# Patient Record
Sex: Female | Born: 1950
Health system: Southern US, Community
[De-identification: ages and names within clinical notes are randomized; demographics above are authoritative.]

## PROBLEM LIST (undated history)

## (undated) DIAGNOSIS — R51 Headache: Secondary | ICD-10-CM

## (undated) DIAGNOSIS — K219 Gastro-esophageal reflux disease without esophagitis: Secondary | ICD-10-CM

## (undated) DIAGNOSIS — K5090 Crohn's disease, unspecified, without complications: Secondary | ICD-10-CM

## (undated) DIAGNOSIS — H9319 Tinnitus, unspecified ear: Secondary | ICD-10-CM

## (undated) DIAGNOSIS — L0390 Cellulitis, unspecified: Secondary | ICD-10-CM

## (undated) DIAGNOSIS — E538 Deficiency of other specified B group vitamins: Secondary | ICD-10-CM

## (undated) DIAGNOSIS — R002 Palpitations: Secondary | ICD-10-CM

## (undated) DIAGNOSIS — F419 Anxiety disorder, unspecified: Secondary | ICD-10-CM

## (undated) DIAGNOSIS — Z87442 Personal history of urinary calculi: Secondary | ICD-10-CM

## (undated) DIAGNOSIS — F32A Depression, unspecified: Secondary | ICD-10-CM

## (undated) DIAGNOSIS — E782 Mixed hyperlipidemia: Secondary | ICD-10-CM

## (undated) DIAGNOSIS — M1990 Unspecified osteoarthritis, unspecified site: Secondary | ICD-10-CM

## (undated) DIAGNOSIS — E119 Type 2 diabetes mellitus without complications: Secondary | ICD-10-CM

## (undated) DIAGNOSIS — I639 Cerebral infarction, unspecified: Secondary | ICD-10-CM

## (undated) DIAGNOSIS — N183 Chronic kidney disease, stage 3 unspecified: Secondary | ICD-10-CM

## (undated) DIAGNOSIS — K589 Irritable bowel syndrome without diarrhea: Secondary | ICD-10-CM

## (undated) DIAGNOSIS — Z8709 Personal history of other diseases of the respiratory system: Secondary | ICD-10-CM

## (undated) DIAGNOSIS — G4700 Insomnia, unspecified: Secondary | ICD-10-CM

## (undated) DIAGNOSIS — A029 Salmonella infection, unspecified: Secondary | ICD-10-CM

## (undated) DIAGNOSIS — L0291 Cutaneous abscess, unspecified: Secondary | ICD-10-CM

## (undated) DIAGNOSIS — E1140 Type 2 diabetes mellitus with diabetic neuropathy, unspecified: Secondary | ICD-10-CM

## (undated) DIAGNOSIS — L739 Follicular disorder, unspecified: Secondary | ICD-10-CM

## (undated) DIAGNOSIS — Z9889 Other specified postprocedural states: Secondary | ICD-10-CM

## (undated) DIAGNOSIS — J189 Pneumonia, unspecified organism: Secondary | ICD-10-CM

## (undated) DIAGNOSIS — F329 Major depressive disorder, single episode, unspecified: Secondary | ICD-10-CM

## (undated) DIAGNOSIS — K449 Diaphragmatic hernia without obstruction or gangrene: Secondary | ICD-10-CM

## (undated) DIAGNOSIS — Z9289 Personal history of other medical treatment: Secondary | ICD-10-CM

## (undated) DIAGNOSIS — D649 Anemia, unspecified: Secondary | ICD-10-CM

## (undated) DIAGNOSIS — K2100 Gastro-esophageal reflux disease with esophagitis, without bleeding: Secondary | ICD-10-CM

## (undated) DIAGNOSIS — S72409A Unspecified fracture of lower end of unspecified femur, initial encounter for closed fracture: Secondary | ICD-10-CM

## (undated) DIAGNOSIS — S7291XA Unspecified fracture of right femur, initial encounter for closed fracture: Secondary | ICD-10-CM

## (undated) DIAGNOSIS — N179 Acute kidney failure, unspecified: Secondary | ICD-10-CM

## (undated) HISTORY — DX: Crohn's disease, unspecified, without complications: K50.90

## (undated) HISTORY — DX: Cutaneous abscess, unspecified: L02.91

## (undated) HISTORY — PX: COLONOSCOPY: SHX174

## (undated) HISTORY — DX: Type 2 diabetes mellitus with diabetic neuropathy, unspecified: E11.40

## (undated) HISTORY — DX: Unspecified fracture of lower end of unspecified femur, initial encounter for closed fracture: S72.409A

## (undated) HISTORY — DX: Gastro-esophageal reflux disease with esophagitis: K21.0

## (undated) HISTORY — DX: Insomnia, unspecified: G47.00

## (undated) HISTORY — PX: TOTAL ABDOMINAL HYSTERECTOMY: SHX209

## (undated) HISTORY — DX: Salmonella infection, unspecified: A02.9

## (undated) HISTORY — DX: Palpitations: R00.2

## (undated) HISTORY — PX: TONSILLECTOMY: SUR1361

## (undated) HISTORY — PX: JOINT REPLACEMENT: SHX530

## (undated) HISTORY — DX: Irritable bowel syndrome, unspecified: K58.9

## (undated) HISTORY — PX: CARDIAC CATHETERIZATION: SHX172

## (undated) HISTORY — DX: Acute kidney failure, unspecified: N17.9

## (undated) HISTORY — DX: Depression, unspecified: F32.A

## (undated) HISTORY — DX: Major depressive disorder, single episode, unspecified: F32.9

## (undated) HISTORY — DX: Chronic kidney disease, stage 3 (moderate): N18.3

## (undated) HISTORY — DX: Headache: R51

## (undated) HISTORY — DX: Gastro-esophageal reflux disease with esophagitis, without bleeding: K21.00

## (undated) HISTORY — DX: Unspecified osteoarthritis, unspecified site: M19.90

## (undated) HISTORY — DX: Unspecified fracture of right femur, initial encounter for closed fracture: S72.91XA

## (undated) HISTORY — PX: CHOLECYSTECTOMY: SHX55

## (undated) HISTORY — PX: HERNIA REPAIR: SHX51

## (undated) HISTORY — DX: Cellulitis, unspecified: L03.90

## (undated) HISTORY — DX: Deficiency of other specified B group vitamins: E53.8

## (undated) HISTORY — PX: BACK SURGERY: SHX140

## (undated) HISTORY — DX: Follicular disorder, unspecified: L73.9

## (undated) HISTORY — PX: EYE SURGERY: SHX253

## (undated) HISTORY — PX: FRACTURE SURGERY: SHX138

---

## 2002-05-06 ENCOUNTER — Observation Stay (HOSPITAL_COMMUNITY): Admission: EM | Admit: 2002-05-06 | Discharge: 2002-05-07 | Payer: Self-pay | Admitting: *Deleted

## 2002-09-18 ENCOUNTER — Ambulatory Visit (HOSPITAL_COMMUNITY): Admission: RE | Admit: 2002-09-18 | Discharge: 2002-09-18 | Payer: Self-pay | Admitting: Internal Medicine

## 2004-07-17 ENCOUNTER — Inpatient Hospital Stay (HOSPITAL_BASED_OUTPATIENT_CLINIC_OR_DEPARTMENT_OTHER): Admission: RE | Admit: 2004-07-17 | Discharge: 2004-07-17 | Payer: Self-pay | Admitting: *Deleted

## 2005-03-13 ENCOUNTER — Ambulatory Visit: Payer: Self-pay | Admitting: Internal Medicine

## 2009-02-26 ENCOUNTER — Ambulatory Visit: Payer: Self-pay | Admitting: Cardiology

## 2009-03-21 ENCOUNTER — Ambulatory Visit: Payer: Self-pay | Admitting: Cardiology

## 2009-08-14 DIAGNOSIS — R079 Chest pain, unspecified: Secondary | ICD-10-CM | POA: Insufficient documentation

## 2010-06-21 ENCOUNTER — Encounter: Payer: Self-pay | Admitting: Cardiology

## 2010-09-14 ENCOUNTER — Encounter: Payer: Self-pay | Admitting: Cardiology

## 2010-09-21 ENCOUNTER — Encounter: Payer: Self-pay | Admitting: Cardiology

## 2010-10-05 ENCOUNTER — Encounter: Payer: Self-pay | Admitting: Cardiology

## 2010-10-07 ENCOUNTER — Encounter: Payer: Self-pay | Admitting: Cardiology

## 2010-10-08 ENCOUNTER — Encounter: Payer: Self-pay | Admitting: Cardiology

## 2010-10-09 ENCOUNTER — Encounter: Payer: Self-pay | Admitting: Cardiology

## 2010-11-16 ENCOUNTER — Telehealth (INDEPENDENT_AMBULATORY_CARE_PROVIDER_SITE_OTHER): Payer: Self-pay | Admitting: *Deleted

## 2010-11-18 ENCOUNTER — Encounter: Payer: Self-pay | Admitting: Cardiology

## 2010-11-20 ENCOUNTER — Encounter: Payer: Self-pay | Admitting: Cardiology

## 2010-11-20 ENCOUNTER — Ambulatory Visit
Admission: RE | Admit: 2010-11-20 | Discharge: 2010-11-20 | Payer: Self-pay | Source: Home / Self Care | Attending: Cardiology | Admitting: Cardiology

## 2010-11-20 ENCOUNTER — Ambulatory Visit: Admit: 2010-11-20 | Payer: Self-pay | Admitting: Cardiology

## 2010-11-20 DIAGNOSIS — H8110 Benign paroxysmal vertigo, unspecified ear: Secondary | ICD-10-CM | POA: Insufficient documentation

## 2010-11-20 DIAGNOSIS — R42 Dizziness and giddiness: Secondary | ICD-10-CM | POA: Insufficient documentation

## 2010-11-20 DIAGNOSIS — R9431 Abnormal electrocardiogram [ECG] [EKG]: Secondary | ICD-10-CM | POA: Insufficient documentation

## 2010-11-20 DIAGNOSIS — R609 Edema, unspecified: Secondary | ICD-10-CM | POA: Insufficient documentation

## 2010-11-20 DIAGNOSIS — I2789 Other specified pulmonary heart diseases: Secondary | ICD-10-CM | POA: Insufficient documentation

## 2010-11-20 DIAGNOSIS — I079 Rheumatic tricuspid valve disease, unspecified: Secondary | ICD-10-CM | POA: Insufficient documentation

## 2010-11-20 DIAGNOSIS — R0602 Shortness of breath: Secondary | ICD-10-CM | POA: Insufficient documentation

## 2010-11-20 DIAGNOSIS — Z9189 Other specified personal risk factors, not elsewhere classified: Secondary | ICD-10-CM | POA: Insufficient documentation

## 2010-11-22 ENCOUNTER — Encounter: Payer: Self-pay | Admitting: Cardiology

## 2010-11-30 NOTE — Letter (Signed)
Summary: Internal Other/ PATIENT HISTORY FORM  Internal Other/ PATIENT HISTORY FORM   Imported By: Bartholomew Boards 11/20/2010 15:41:36  _____________________________________________________________________  External Attachment:    Type:   Image     Comment:   External Document

## 2010-11-30 NOTE — Progress Notes (Signed)
Summary: cancelled appt.   ---- Converted from flag ---- ---- 11/15/2010 11:20 AM, Cher Nakai wrote: patient called to cancel appt while call back if she wants to reschedule ------------------------------  Noted Terald Sleeper, MD, [facility]  November 17, 2010 9:50 AM

## 2010-11-30 NOTE — Medication Information (Signed)
Summary: [place] D/C MEDICATION SHEET  [place] D/C MEDICATION SHEET   Imported By: Delfino Lovett 11/20/2010 13:28:48  _____________________________________________________________________  External Attachment:    Type:   Image     Comment:   External Document

## 2010-11-30 NOTE — Letter (Signed)
Summary: [place] D/C DR. [facility]  [place] D/C DR. [facility]   Imported By: Delfino Lovett 11/20/2010 13:38:08  _____________________________________________________________________  External Attachment:    Type:   Image     Comment:   External Document

## 2010-11-30 NOTE — Letter (Signed)
Summary: External Correspondence/ OFFICE NOTE [facility]  External Correspondence/ OFFICE NOTE [facility]   Imported By: Bartholomew Boards 10/19/2010 14:14:00  _____________________________________________________________________  External Attachment:    Type:   Image     Comment:   External Document

## 2010-11-30 NOTE — Medication Information (Signed)
Summary: RX Folder/ [facility] MED LIST  RX Folder/ [facility] MED LIST   Imported By: Bartholomew Boards 10/19/2010 14:17:28  _____________________________________________________________________  External Attachment:    Type:   Image     Comment:   External Document

## 2010-11-30 NOTE — Letter (Signed)
Summary: External Correspondence/ OFFIVE NOTE [facility]  External Correspondence/ OFFIVE NOTE [facility]   Imported By: Bartholomew Boards 10/19/2010 14:09:09  _____________________________________________________________________  External Attachment:    Type:   Image     Comment:   External Document

## 2010-11-30 NOTE — Letter (Signed)
Summary: External Correspondence/ OFFICE NOTE [facility]  External Correspondence/ OFFICE NOTE [facility]   Imported By: Bartholomew Boards 10/19/2010 14:11:11  _____________________________________________________________________  External Attachment:    Type:   Image     Comment:   External Document

## 2010-11-30 NOTE — Assessment & Plan Note (Signed)
Summary: EST - ABN ECHO   Visit Type:  Follow-up Primary Provider:  Manuella Ghazi   History of Present Illness: the patient is a 60 year old female with a prior ischemia workup in 2010 with a Cardiolite study which was negative.  She also had a CT scan for ovary emboli which was within normal limits.  We saw the patient in consultation at that time.  The patient has not been referred by her primary care physician because for what appears to be an abnormal echocardiogram.  She was found to have mild to moderate tricuspid regurgitation and mild pulmonary hypertension.  The patient reports is cardinal symptom that she has been short of breath over the last several weeks.  She also has noticed swelling in the lower extremities particularly in the left leg over the last several months.  He does not appear that she had a chest x-ray done recently.  She did have the horizontal over extremity at [facility] which showed no DVT.  The patient states that she is short of breath on minimal exertion she feels that she get even fix supper.  She also symptoms of orthostasis and becomes very dizzy when she stands up too quickly.  Additionally when she lays down to quickly she then experienced the symptoms of vertigo.  Her blood pressure is elevated today at 164/88.  Of note is that the patient also complains of left-sided chest pain which is constant and is reproducible with palpation of the left chest.  She was also hospitalized recently with arm pain and uncontrolled hypertension.  She denies however any exertional chest pain.  Preventive Screening-Counseling & Management  Alcohol-Tobacco     Smoking Status: never  Current Medications (verified): 1)  Furosemide 40 Mg Tabs (Furosemide) .... Take 1 Tablet By Mouth Once A Day 2)  Nexium 40 Mg Cpdr (Esomeprazole Magnesium) .... Take 1 Tablet By Mouth Once A Day 3)  Estrace 0.5 Mg Tabs (Estradiol) .... Take 1 Tablet By Mouth Once A Day 4)  Diovan 160 Mg Tabs  (Valsartan) .... Take 1 Tablet By Mouth Once A Day 5)  Metoprolol Tartrate 100 Mg Tabs (Metoprolol Tartrate) .... Take 1 Tablet By Mouth Once A Day 6)  Clonidine Hcl 0.1 Mg Tabs (Clonidine Hcl) .... Take 1 Tablet By Mouth Two Times A Day 7)  Celexa 20 Mg Tabs (Citalopram Hydrobromide) .... Take 1 Tablet By Mouth Once A Day 8)  Indomethacin 50 Mg Caps (Indomethacin) .... Take 1 Tablet By Mouth Three Times A Day As Needed 9)  Cyanocobalamin 1000 Mcg/ml Soln (Cyanocobalamin) .... One Injection Monthly 10)  Amaryl 4 Mg Tabs (Glimepiride) .... Take 1 Tablet By Mouth Two Times A Day 11)  Promethazine Hcl 25 Mg Tabs (Promethazine Hcl) .... Take 1 Tablet By Mouth Three Times A Day As Needed 12)  Levemir Flexpen 100 Unit/ml Soln (Insulin Detemir) .... 60 Units Every Morning & 10 Units Every Evening 13)  Zanaflex 4 Mg Tabs (Tizanidine Hcl) .... Take 2 Tablet By Mouth Once A Day At Bedtime 14)  Vitamin D 1000 Unit Tabs (Cholecalciferol) .... Take 1 Tablet By Mouth Once A Day 15)  Zocor 20 Mg Tabs (Simvastatin) .... Take 1 Tab By Mouth At Bedtime  Allergies (verified): 1)  ! * Lisinopril 2)  ! Sulfa 3)  ! Norvasc 4)  ! Cipro 5)  ! Glucophage  Past History:  Past Surgical History: Last updated: 08/14/2009 Abdominal Hysterectomy-Total Cholecystectomy  Family History: Last updated: 08/14/2009 Family History of CVA or Stroke:  Social History: Last updated: 08/14/2009 Full Time Married  Tobacco Use - No.  Alcohol Use - no  Risk Factors: Smoking Status: never (11/20/2010)  Past Medical History: CHEST PAIN-UNSPECIFIED (ICD-786.50) echocardiogram December 2011 primary care office LVH, mild with hypertension with mild to moderate tricuspid regurgitation and mild mitral regurgitation Cardiolite 2002 negative for ischemia CT scan chest 2010 no evidence for pulmonary emboli chronic renal insufficiency creatinine 1.35 and GFR 40 ML/min November 2011 difficult to control hypertension rule out  vertigo    Review of Systems       The patient complains of fatigue, chest pain, shortness of breath, leg swelling, and dizziness.  The patient denies malaise, fever, weight gain/loss, vision loss, decreased hearing, hoarseness, palpitations, prolonged cough, wheezing, sleep apnea, coughing up blood, abdominal pain, blood in stool, nausea, vomiting, diarrhea, heartburn, incontinence, blood in urine, muscle weakness, joint pain, rash, skin lesions, headache, fainting, depression, anxiety, enlarged lymph nodes, easy bruising or bleeding, and environmental allergies.    Vital Signs:  Patient profile:   60 year old female Height:      63 inches Weight:      174.50 pounds BMI:     31.02 Pulse rate:   68 / minute Pulse (ortho):   68 / minute BP sitting:   164 / 88  (left arm) BP standing:   154 / 62 Cuff size:   regular  Vitals Entered By: Lovina Reach, LPN (January 23, [id number] 1:49 PM)  Nutrition Counseling: Patient's BMI is greater than 25 and therefore counseled on weight management options.  Serial Vital Signs/Assessments:  Time      Position  BP       Pulse  Resp  Temp     By 2:45 PM   Lying RA  164/78   60                    Lovina Reach, LPN 579FGE PM   Sitting   162/78   60                    [place], LPN 579FGE PM   Standing  154/62   68                    [place], LPN  Comments: 579FGE PM after 3 min:  144/66  68 after 5 min:  130/68  76 By: Lovina Reach, LPN   Is Patient Diabetic? Yes Comments abnormal echo per [name]   Physical Exam  Additional Exam:  General: Well-developed, well-nourished in no distress head: Normocephalic and atraumatic eyes PERRLA/EOMI intact, conjunctiva and lids normal nose: No deformity or lesions mouth normal dentition, normal posterior pharynx neck: Supple, no JVD.  No masses, thyromegaly or abnormal cervical nodestrauma scar right anterior neck from prior laminectomy lungs: Normal breath sounds bilaterally without wheezing.  Normal  percussion heart: regular rate and rhythm with normal S1 and S2, no S3 or S4.  PMI is normal.  No pathological murmurs abdomen: Normal bowel sounds, abdomen is soft and nontender without masses, organomegaly or hernias noted.  No hepatosplenomegaly musculoskeletal: Back normal, normal gait muscle strength and tone normal pulsus: Pulse is normal in all 4 extremities Extremities: transversal pitting edema somewhat asymmetrical worse in the left leg. neurologic: Alert and oriented x 3 skin: Intact without lesions or rashes cervical nodes: No significant adenopathy psychologic: Normal affect    Impression & Recommendations:  Problem # 1:  CHEST WALL PAIN, HX OF (  ICD-V15.89) the patient's chest wall pain is atypical.  It does not appear to represent angina.  The patient had a stress test in 2010 which was negative for ischemia. however she may need additional workup for ischemia.  Her cardinal symptoms however shortness of breath and this will be worked up first.  Problem # 2:  DYSPNEA (ICD-786.05)  the patient will need a chest x-ray, ABG and a d-dimer level.  She has been ruled out in the past for pulmonary emboli but may need repeat ventilation perfusion scan particularly if she has ongoing renal insufficiency.  We will check the patient's electrolyte panel also. Her updated medication list for this problem includes:    Furosemide 40 Mg Tabs (Furosemide) .Marland Kitchen... Take 1 tablet by mouth once a day    Diovan 160 Mg Tabs (Valsartan) .Marland Kitchen... Take 1 tablet by mouth once a day    Metoprolol Tartrate 100 Mg Tabs (Metoprolol tartrate) .Marland Kitchen... Take 1 tablet by mouth once a day  Orders: T-Chest x-ray, 2 views ([id number]) T-Basic Metabolic Panel ([id number]) T-CBC No Diff [id number]) T-D-Dimer Fibrin Derivatives Quantitive [id number]) ABG Room Air (ABG Room Air)  Problem # 3:  LEG EDEMA, BILATERAL (ICD-782.3) the patient is bottle leg edema somewhat worse on the left side however she is already on  significant doses Lasix.  The patient may require right heart catheterization to make sure that her PA pressures normal under estimated by the echocardiogram.  She could have significant pulmonary hypertension.  Problem # 4:  ORTHOSTATIC DIZZINESS (ICD-780.4) orthostatic blood pressures will be obtaining the office today.  This will be difficult to treat given her underlying hypertension I recommend compression stockings in the meanwhile  Problem # 5:  TRICUSPID REGURGITATION, MODERATE (ICD-397.0) etiology of tricuspid regurgitation is not clear but could be from pulmonary hypertension.  Again the patient may need heart catheterization. Her updated medication list for this problem includes:    Furosemide 40 Mg Tabs (Furosemide) .Marland Kitchen... Take 1 tablet by mouth once a day    Diovan 160 Mg Tabs (Valsartan) .Marland Kitchen... Take 1 tablet by mouth once a day    Metoprolol Tartrate 100 Mg Tabs (Metoprolol tartrate) .Marland Kitchen... Take 1 tablet by mouth once a day  Problem # 6:  NONSPECIFIC ABNORMAL ELECTROCARDIOGRAM (ICD-794.31) the patient appears to have QTC prolongation of which could be from a combination of promethazine and citalopram.  The patient however reports no palpitations or rhythm abnormalities. Her updated medication list for this problem includes:    Metoprolol Tartrate 100 Mg Tabs (Metoprolol tartrate) .Marland Kitchen... Take 1 tablet by mouth once a day  Other Orders: EKG w/ Interpretation ([id number])  Patient Instructions: 1)  Follow up with Dr. Lutricia Feil on Wed, December 06, 2010 at 3pm. 2)  A chest x-ray takes a picture of the organs and structures inside the chest, including the heart, lungs, and blood vessels. This test can show several things, including, whether the heart is enlarged; whether fluid is building up in the lungs; and whether pacemaker / defibrillator leads are still in place. 3)  Your physician recommends that you have labwork and an ABG done at [facility]. Please go to Outpatient Registration on Wed,  November 22, 2010 at 1030am to have this done.  4)  Further testing will be determined by lab results.

## 2010-12-06 ENCOUNTER — Ambulatory Visit: Payer: Self-pay | Admitting: Cardiology

## 2010-12-12 ENCOUNTER — Inpatient Hospital Stay (HOSPITAL_COMMUNITY)
Admission: AD | Admit: 2010-12-12 | Discharge: 2010-12-14 | DRG: 287 | Disposition: A | Discharge status: Home / Self Care | Payer: Medicare Other | Source: Other Acute Inpatient Hospital | Attending: Cardiology | Admitting: Cardiology

## 2010-12-12 DIAGNOSIS — D649 Anemia, unspecified: Secondary | ICD-10-CM | POA: Diagnosis present

## 2010-12-12 DIAGNOSIS — K219 Gastro-esophageal reflux disease without esophagitis: Secondary | ICD-10-CM | POA: Diagnosis present

## 2010-12-12 DIAGNOSIS — E785 Hyperlipidemia, unspecified: Secondary | ICD-10-CM | POA: Diagnosis present

## 2010-12-12 DIAGNOSIS — R0602 Shortness of breath: Secondary | ICD-10-CM

## 2010-12-12 DIAGNOSIS — N189 Chronic kidney disease, unspecified: Secondary | ICD-10-CM | POA: Diagnosis present

## 2010-12-12 DIAGNOSIS — I129 Hypertensive chronic kidney disease with stage 1 through stage 4 chronic kidney disease, or unspecified chronic kidney disease: Secondary | ICD-10-CM | POA: Diagnosis present

## 2010-12-12 DIAGNOSIS — K449 Diaphragmatic hernia without obstruction or gangrene: Secondary | ICD-10-CM | POA: Diagnosis present

## 2010-12-12 DIAGNOSIS — R0789 Other chest pain: Principal | ICD-10-CM | POA: Diagnosis present

## 2010-12-12 DIAGNOSIS — E119 Type 2 diabetes mellitus without complications: Secondary | ICD-10-CM | POA: Diagnosis present

## 2010-12-12 DIAGNOSIS — R072 Precordial pain: Secondary | ICD-10-CM

## 2010-12-12 LAB — GLUCOSE, CAPILLARY: Glucose-Capillary: 212 mg/dL — ABNORMAL HIGH (ref 70–99)

## 2010-12-13 ENCOUNTER — Inpatient Hospital Stay (HOSPITAL_COMMUNITY): Payer: Medicare Other

## 2010-12-13 DIAGNOSIS — R079 Chest pain, unspecified: Secondary | ICD-10-CM

## 2010-12-13 LAB — CBC
HCT: 29.5 % — ABNORMAL LOW (ref 36.0–46.0)
Hemoglobin: 9.5 g/dL — ABNORMAL LOW (ref 12.0–15.0)
MCH: 28.2 pg (ref 26.0–34.0)
MCHC: 32.2 g/dL (ref 30.0–36.0)
MCV: 87.5 fL (ref 78.0–100.0)
RBC: 3.37 MIL/uL — ABNORMAL LOW (ref 3.87–5.11)

## 2010-12-13 LAB — POCT I-STAT, CHEM 8
BUN: 16 mg/dL (ref 6–23)
Calcium, Ion: 1.18 mmol/L (ref 1.12–1.32)
Chloride: 105 mEq/L (ref 96–112)
Creatinine, Ser: 1.4 mg/dL — ABNORMAL HIGH (ref 0.4–1.2)
TCO2: 22 mmol/L (ref 0–100)

## 2010-12-13 LAB — POCT I-STAT 3, VENOUS BLOOD GAS (G3P V)
Acid-base deficit: 2 mmol/L (ref 0.0–2.0)
Acid-base deficit: 6 mmol/L — ABNORMAL HIGH (ref 0.0–2.0)
Bicarbonate: 24 mEq/L (ref 20.0–24.0)
Bicarbonate: 24.6 mEq/L — ABNORMAL HIGH (ref 20.0–24.0)
O2 Saturation: 51 %
O2 Saturation: 44 %
TCO2: 26 mmol/L (ref 0–100)
pCO2, Ven: 51.6 mmHg — ABNORMAL HIGH (ref 45.0–50.0)
pCO2, Ven: 51.3 mmHg — ABNORMAL HIGH (ref 45.0–50.0)
pCO2, Ven: 51.6 mmHg — ABNORMAL HIGH (ref 45.0–50.0)
pH, Ven: 7.277 (ref 7.250–7.300)
pH, Ven: 7.267 (ref 7.250–7.300)
pH, Ven: 7.285 (ref 7.250–7.300)
pO2, Ven: 32 mmHg (ref 30.0–45.0)

## 2010-12-13 LAB — D-DIMER, QUANTITATIVE: D-Dimer, Quant: <0.22 ug/mL-FEU (ref 0.00–0.48)

## 2010-12-13 LAB — POCT I-STAT 3, ART BLOOD GAS (G3+)
Acid-base deficit: 3 mmol/L — ABNORMAL HIGH (ref 0.0–2.0)
Bicarbonate: 22.6 mEq/L (ref 20.0–24.0)
O2 Saturation: 92 %
pO2, Arterial: 70 mmHg — ABNORMAL LOW (ref 80.0–100.0)

## 2010-12-13 LAB — BASIC METABOLIC PANEL
CO2: 27 mEq/L (ref 19–32)
Calcium: 8.5 mg/dL (ref 8.4–10.5)
Chloride: 104 mEq/L (ref 96–112)
Creatinine, Ser: 1.42 mg/dL — ABNORMAL HIGH (ref 0.4–1.2)
Glucose, Bld: 123 mg/dL — ABNORMAL HIGH (ref 70–99)

## 2010-12-13 LAB — GLUCOSE, CAPILLARY
Glucose-Capillary: 108 mg/dL — ABNORMAL HIGH (ref 70–99)
Glucose-Capillary: 98 mg/dL (ref 70–99)

## 2010-12-14 DIAGNOSIS — R079 Chest pain, unspecified: Secondary | ICD-10-CM

## 2010-12-14 LAB — GLUCOSE, CAPILLARY: Glucose-Capillary: 165 mg/dL — ABNORMAL HIGH (ref 70–99)

## 2010-12-14 LAB — CBC
HCT: 27.5 % — ABNORMAL LOW (ref 36.0–46.0)
Hemoglobin: 8.9 g/dL — ABNORMAL LOW (ref 12.0–15.0)
MCHC: 32.4 g/dL (ref 30.0–36.0)
RBC: 3.12 MIL/uL — ABNORMAL LOW (ref 3.87–5.11)
WBC: 6.4 10*3/uL (ref 4.0–10.5)

## 2010-12-14 LAB — BASIC METABOLIC PANEL
CO2: 24 mEq/L (ref 19–32)
Calcium: 8.1 mg/dL — ABNORMAL LOW (ref 8.4–10.5)
GFR calc Af Amer: 45 mL/min — ABNORMAL LOW (ref >60)
Potassium: 4 mEq/L (ref 3.5–5.1)
Sodium: 138 mEq/L (ref 135–145)

## 2010-12-14 NOTE — H&P (Signed)
Heather [name], Heather [name]           ACCOUNT NO.:  [id number]  MEDICAL RECORD NO.:  [id number]           PATIENT TYPE:  I  LOCATION:  2919                         FACILITY:  [place]  PHYSICIAN:  Fransico Him, M.D.     DATE OF BIRTH:  1950-12-03  DATE OF ADMISSION:  12/12/2010 DATE OF DISCHARGE:                             HISTORY & PHYSICAL   REFERRING PHYSICIAN:  [facility].  PRIMARY CARDIOLOGIST:  Loralie Champagne, MD  CHIEF COMPLAINT:  Chest pain.  HISTORY OF PRESENT ILLNESS:  This is a 60 year old white female with a history of hypertension, diabetes mellitus, dyslipidemia, and normal coronary arteries by cath in 2005 who presented to [facility] with complaints of substernal chest pain.  She has had chronic chest pain for several years with a negative CT for PE and nuclear stress test that was normal in 2010.  Echo at that time showed mild pulmonary hypertension.  She has continued to have chronic intermittent chest pain which over the past couple of weeks has become severe and mainly almost constant in nature.  It is left sided, reproducible with palpation on her axilla, and is associated with shortness of breath.  She is now transferred from [facility] for right and left heart catheterization for further evaluation.  PAST MEDICAL HISTORY:  Hypertension, dyslipidemia, type 2 diabetes mellitus, normal coronary arteries by cath in 2005, atypical chest pain, GERD with hiatal hernia, kidney stone at age 70, and chemical-induced laryngitis in 2001.  ALLERGIES: 1. SULFA. 2. METFORMIN. 3. CIPRO  PAST SURGICAL HISTORY:  Status post left inguinal hernia repair, benign breast biopsy status post cholecystectomy in 2002, partial hysterectomy for cervical for uterine CA, removal of urethral cyst, and status post cystectomy.  SOCIAL HISTORY:  She is married.  She denies any tobacco or alcohol use.  FAMILY HISTORY:  Her mother had female cancer, diabetes, and CVA.   Her father died of MI at 83.  MEDICATIONS: 1. Estrace 0.5 mg daily. 2. Nexium 40 mg daily. 3. Diovan 160 mg daily. 4. Tizanidine 4 mg 2 tablets nightly. 5. Vitamin D 1000 international units daily. 6. Amaryl 4 mg 2 tablets daily. 7. Furosemide 40 mg daily. 8. Zocor 20 mg daily. 9. Celexa 20 mg daily. 10.Lopressor 100 mg daily. 11.__________ mg daily. 12.Clonidine 0.1 mg daily. 13.Levemir insulin 60 units in the morning and 10 units in the     evening.  REVIEW OF SYSTEMS:  Otherwise as stated in the HPI is negative.  PHYSICAL EXAMINATION:  VITAL SIGNS:  Blood pressure is 128/60, heart rate 70, and O2 saturations 100% on room air. GENERAL:  This is a well-developed, well-nourished white female in no acute distress. HEENT:  Benign. NECK:  Supple without lymphadenopathy.  Carotid upstrokes are +2 bilaterally.  No bruits. LUNGS:  Clear to auscultation throughout. HEART:  Regular rate and rhythm.  No murmurs, rubs, or gallops.  Normal S1 and S2. ABDOMEN:  Soft, nontender, and nondistended.  Normoactive bowel sounds. No hepatosplenomegaly. EXTREMITIES:  No cyanosis, erythema, or edema.  LABORATORY DATA:  Sodium 137, potassium 3.5, chloride 104, bicarb 24, BUN 19, creatinine 1.19, and glucose 272.  White cell count 6.1, hemoglobin 10.1, hematocrit 30.2, and platelet count 200.  D-dimer 0.23. Hemoglobin A1C 8.2.  BNP 52.  CPKs are 86, 95, and 115.  MB 1.5, 1.8, and 2.1.  Troponin 0.01 x3.  EKG shows normal sinus rhythm with nonspecific T-wave abnormality.  ASSESSMENT: 1. Atypical chest pain with cardiac risk factors including     hypertension, dyslipidemia, and diabetes mellitus with negative     cardiac catheterization in 2005.  Her pain is pretty much constant     now and nothing helps the pain and despite negative cardiac     enzymes. 2. Diabetes mellitus. 3. Hypertension. 4. Gastroesophageal reflux disease. 5. Dyslipidemia.  PLAN:  Admit to Telemetry Step-down Unit.   We will continue IV nitroglycerin drip.  No anticoagulation given the patient's rule out for MI is complete and her symptoms are atypical.  We will make her n.p.o. after midnight.  We will plan right and left heart cath in the morning per Dr. Aundra Dubin.     Fransico Him, M.D.    TT/MEDQ  D:  12/12/2010  T:  12/13/2010  Job:  [id number]  cc:   Loralie Champagne, MD  Electronically Signed by Fransico Him M.D. on 12/14/2010 04:14:35 PM

## 2010-12-18 ENCOUNTER — Encounter: Payer: Self-pay | Admitting: Cardiology

## 2010-12-20 NOTE — Procedures (Signed)
NAMEMARTAVIA, [name]           ACCOUNT NO.:  [id number]  MEDICAL RECORD NO.:  [id number]           PATIENT TYPE:  I  LOCATION:  2919                         FACILITY:  [place]  PHYSICIAN:  Loretha Brasil. Lia Foyer, MD, FACCDATE OF BIRTH:  03/21/1951  DATE OF PROCEDURE:  12/13/2010 DATE OF DISCHARGE:                           CARDIAC CATHETERIZATION   INDICATIONS:  Ms. Garstka is 60 years old.  She has been complaining of some chest pain and shortness of breath.  She was seen by Dr. Dannielle Burn and subsequently referred down for cardiac catheterization.  Risks, benefits, and alternatives were discussed with the patient.  We elected not to perform left ventriculography because of her reduced creatinine clearance.  PROCEDURE: 1. Right and left heart catheterization. 2. Selective coronary territory.  DESCRIPTION OF PROCEDURE:  The patient was brought to the catheterization laboratory and prepped and draped in the usual fashion after informed consent.  Using a Smart needle, the right femoral vein was entered, 7-French sheath was placed.  Thermodilution Swan-Ganz catheter was placed in the superior vena cava where saturation was obtained.  Serial right heart pressures were then measured and pulmonary artery saturation was obtained.  Because of the low saturations in the pulmonary artery as well as the superior vena cava, we elected to repeat these values on another machine, they were similar.  Thermodilution cardiac outputs were performed.  Following this, a Smart needle was used to enter the right femoral artery, 5-French sheath was placed.  A pigtail catheter was placed in the central aorta and also in the left ventricle.  Saturation was obtained.  Following this, simultaneous wedge LV was performed.  Following this, a right heart pullback was performed and simultaneous RV, LV pressures obtained.  There was clearly a differential between the LV and RV pressure and as well as the LV and  RA pressure.  The thermodilution catheter was then placed up in the superior vena cava where an additional saturation was obtained.  A pigtail catheter was pulled back for pullback pressure.  As mentioned no ventriculography was performed.  These catheters were all then removed. Coronary arteriography was then performed using a standard Judkins catheter for the left and a Williams catheter for the right.  There were no complications.  I called the patient's husband by phone.  The sheaths were removed in the laboratory for hemostasis.  HEMODYNAMIC DATA: 1. Right atrial pressure 9-11. 2. RV 32/9-12. 3. Pulmonary artery 32/18, mean 24. 4. Pulmonary capillary wedge 17. 5. Aortic 111/60, mean 81. 6. LV 102/17. 7. No gradient pullback across aortic valve. 8. No mitral valve gradient and simultaneous pressure measurements. 9. Superior vena cava saturation 52%. 10.Pulmonary artery saturation 52%. 11.Aortic saturation 92%. 12.Fick cardiac output 4.7 liters per minute. 13.Fick cardiac index 2.6 liters per minute per meter squared. 14.Thermodilution cardiac output 3.9 liters per minute. 15.Thermodilution cardiac index 2.1 liters per minute per meter     squared.  ANGIOGRAPHIC DATA: 1. The left main coronary artery is free of critical disease. 2. The LAD courses towards the apex.  There are twin vessels down over     the anteroseptal, anterolateral segment.  One is the  diagonal, the     other is likely the LAD with multiple septal perforators.  This     vessel was smooth throughout without significant focal narrowing. 3. The circumflex provides a large marginal branch and a smaller AV     circumflex, both of which appear free of critical disease. 4. The right coronary artery is moderately large vessel providing     posterior descending and posterolateral branch.  There may be some     minor plaquing at the bifurcation of the PLA, PDA, but no     significant areas of high-grade focal  stenosis noted.  CONCLUSION: 1. Borderline elevation of right heart pressures without significant     pulmonary hypertension. 2. No evidence of either aortic or mitral valve gradient. 3. Somewhat low saturations in the superior vena cava and pulmonary     artery rechecked during the procedure. 4. No significant coronary obstruction.  DISPOSITION:  I have discussed her case with Dr. Aundra Dubin.  We will get a 2-D echo to reassess her situation whether she needs further imaging studies is unclear.  At the present time, she will not need revascularization.  Her right heart pressures are minimally elevated. Her saturations are somewhat low, for unclear reasons.  I will be gone for the next several days.  She will be seen by the [facility] team and subsequently referred back to Dr. Dannielle Burn in [place].     Loretha Brasil. Lia Foyer, MD, [facility]     TDS/MEDQ  D:  12/13/2010  T:  12/14/2010  Job:  [id number]  cc:   Ernestine Mcmurray, MD,FACC CV Laboratory  Electronically Signed by Bing Quarry MD [facility] on 12/20/2010 09:11:37 PM

## 2010-12-20 NOTE — Discharge Summary (Addendum)
NAMEELLEY, Heather [name] NO.:  [id number]  MEDICAL RECORD NO.:  [id number]           PATIENT TYPE:  I  LOCATION:  2919                         FACILITY:  [facility]  PHYSICIAN:  Carlena Bjornstad, MD, FACCDATE OF BIRTH:  [birth year]  DATE OF ADMISSION:  12/12/2010 DATE OF DISCHARGE:  12/14/2010                              DISCHARGE SUMMARY   DISCHARGE DIAGNOSES: 1. Chest pain, felt noncardiac.     a.     Cardiac catheterization on December 13, 2010, demonstrating      no significant coronary artery disease.     b.     Negative D-dimer. 2. Hypertension. 3. Diabetes mellitus. 4. Dyslipidemia. 5. History of chronic chest pain with negative CT for pulmonary     embolus and nuclear stress test that was normal in 2010. 6. Anemia with discharge hemoglobin of 8.9, for close outpatient     followup next week, per PCP. 7. History of gastroesophageal reflux disease with hiatal hernia. 8. Kidney stone at age 42. 28. Chemical-induced pharyngitis in 2001. 10.Status post left inguinal hernia. 11.Status post breast biopsy that was benign. 12.Status post cholecystectomy in 2002. 13.Status post partial hysterectomy for cervical cancer. 14.Status post removal of urethral cyst. 15.Status post diskectomy. 16.Type 2 diabetes mellitus. 17.Chronic renal insufficiency with discharge creatinine of 1.46.  HOSPITAL COURSE:  [name] is a 60 year old female with a prior history of normal coronaries by cath in 2005, chronic chest pain, hypertension, dyslipidemia, and diabetes, who presented to [facility] with complaints of substernal chest pain with intermittent symptoms over the past that has become severe and was not constant in nature and reproducible with palpation and it was associated with shortness of breath.  She was transferred to [facility] for further evaluation.  IV nitroglycerin drip was started.  Anticoagulation was held given the atypical nature of her  symptoms.  Cardiac catheterization was arranged for the following day on December 13, 2010, which demonstrated no significant coronary artery disease.  She did, however, have borderline elevation of her pressures without significant pulmonary hypertension and somewhat low saturations in the superior vena cava and pulmonary artery which were rechecked during the procedure. Dr. Lia Foyer performed this procedure.  The patient was seen today by Dr. Ron Parker and had other complaints with toe pain and nausea.  D-dimer was done 2 ensure no pulmonary embolism which was within normal limits and negative.  Chest x-ray showed no acute disease.  The etiology of her saturations were unclear, but Dr. Ron Parker did not feel that this was limiting her from going home.  Her hemoglobin today is 8.9, but there are no overt signs of bleeding.  Her hemoglobin on admission was 9.5.  I have discussed the findings with Dr. Ron Parker who has seen and examined her today and feels she is stable for discharge.  Of note, she also had an echocardiogram with the below findings with normal wall motion and normal EF and a mildly elevated PA pressure of 35 mmHg.  DISCHARGE LABORATORY DATA:  WBC 6.4, hemoglobin 8.9, hematocrit 27.5, platelet count 174,000.  D-dimer is 0.2.  Sodium 138, potassium 4.0, chloride 107, CO2  24, glucose 223, BUN 15, creatinine 1.46.  MRSA screen was positive.  STUDIES: 1. Chest x-ray on December 13, 2010 showed no acute findings. 2. Cardiac catheterization on December 13, 2010, please see full     report for details as well as HPI for summary. 3. A 2-D echocardiogram on December 14, 2010, demonstrated moderate     LVH.  EF was 60%.  Wall motion was normal with no wall motion     abnormalities.  PA pressure was 35 mmHg.  DISCHARGE MEDICATIONS: 1. Amaryl 4 mg 2 tablets daily. 2. Celexa 20 mg daily. 3. Clonidine 0.1 mg daily. 4. Diovan 160 mg daily. 5. Estrace 0.5 mg daily. 6. Furosemide 40 mg daily. 7.  Indomethacin 15 mg daily. 8. Levemir, the patient takes 10 units if pressure over 200 and 6     units every morning. 9. Lopressor 100 mg daily. 10.Nexium 40 mg daily. 11.Tizanidine 4 mg 2 tablets daily at bedtime. 12.Visine 1 drop each right daily as needed for allergies. 13.Vitamin B12 1000 mcg/mL one injection subcutaneously monthly. 14.Vitamin D tablet daily. 15.Simvastatin 20 mg 1 tablet daily.  DISPOSITION:  [name] will be discharged in stable condition to home.  She is instructed to increase activity slowly, not to lift anything for 1 week, participate in sexual activity for 1 week, or drive for 2 days.  She is to follow a heart-healthy diabetic diet.  If she notices pain, swelling, or pus at her cath site, she is to call or return.  She was instructed to follow up with her primary care provider as above for evaluation of her anemia.  I have scheduled an appointment for her to see [name] on December 20, 2010, at 2 p.m.  She will also follow up with Dr. Dannielle Burn in our office, we will call her for this appointment.  She is also instructed to return to the ER if she has any activity that causes chest pain, shortness of dizziness, sweating, weakness, or passing out along with bleeding.  The patient has been trying to arrange a ride home for this evening and we have asked Social Work to assist in this as well.  DURATION OF DISCHARGE ENCOUNTER:  Greater than 30 minutes including physician and PA time.     Melina Copa, P.A.C.   ______________________________ Carlena Bjornstad, MD, [facility]    DD/MEDQ  D:  12/14/2010  T:  12/15/2010  Job:  [id number]  cc:   Ernestine Mcmurray, MD,FACC [name], MD  Electronically Signed by Melina Copa  on 12/20/2010 09:14:54 AM Electronically Signed by Dola Argyle MD [place] on 12/21/2010 09:10:09 AM

## 2010-12-26 ENCOUNTER — Encounter (INDEPENDENT_AMBULATORY_CARE_PROVIDER_SITE_OTHER): Payer: Self-pay | Admitting: *Deleted

## 2011-01-04 NOTE — Letter (Signed)
Summary: Risk analyst at [facility]. [address]   [place], [place] [address]   Phone: [phone]  Fax: [phone]        December 26, 2010 MRN: [id number]   BLAYRE BUCHKO [address] [place], [place]  [address]   Dear Ms. Helton,  Your test ordered by Rande Lawman has been reviewed by your physician (or physician assistant) and was found to be normal or stable. Your physician (or physician assistant) felt no changes were needed at this time.  ____ Echocardiogram  ____ Cardiac Stress Test  ___x_ Lab Work    PLEASE REFER TO YOUR PRIMARY PHYSICIAN FOR ANEMIA   ____ Peripheral vascular study of arms, legs or neck  ____ CT scan or X-ray  ____ Lung or Breathing test  ____ Other:   Thank you.   Tammi Romine CMA    Bryon Lions, M.D., F.A.C.C. Maceo Pro, M.D., F.A.C.C. Cammy Copa, M.D., F.A.C.C. Vonda Antigua, M.D., F.A.C.C. Vita Barley, M.D., F.A.C.C. Mare Ferrari, M.D., F.A.C.C. Emi Belfast, PA-C

## 2011-01-10 ENCOUNTER — Ambulatory Visit: Payer: Self-pay | Admitting: Cardiology

## 2011-03-13 NOTE — Assessment & Plan Note (Signed)
[facility] OFFICE NOTE   Heather [name], Heather [name]                  MRN:          [id number]  DATE:03/21/2009                            DOB:          01-26-1951    REFERRING PHYSICIAN:  [name], MD   HISTORY OF PRESENT ILLNESS:  The patient is a very pleasant 60 year old  female who was recently admitted with atypical chest pain likely  secondary to pleurisy.  She does have risk factors for coronary artery  disease including hypertension, dyslipidemia, and diabetes mellitus.  The patient was ruled out for pulmonary embolism by CT scan.  She did an  outpatient Cardiolite study which was also negative for ischemia.  Her  EKG today shows no acute or chronic changes.  The patient states she is  doing well.  She has on discharge received antibiotic treatment and  nonsteroidals.  Her pain has completely resolved, and she states that  she has resumed all normal daily activities.   MEDICATIONS:  1. Estrace 0.5 mg p.o. daily.  2. Lopressor 100 mg p.o. b.i.d.  3. Metformin 500 mg p.o. b.i.d.  4. Diovan/hydrochlorothiazide 320/25 daily.  5. Nexium 40 mg p.o. daily.  6. Celexa 20 mg p.o. daily.  7. Zocor 20 mg p.o. daily.  8. Zanaflex 4 mg p.o. at bedtime.  9. Levemir 30 units q.a.m.  10.NovoLog sliding scale p.r.n.  11.Vitamin B12.  12.Vitamin D.  13.Iron pill.   PHYSICAL EXAMINATION:  VITAL SIGNS:  Blood pressure 179/80, heart rate  63, weight 170 pounds.  GENERAL:  Well-nourished white female in no apparent distress.  HEENT:  Pupils isochoric.  Conjunctivae clear.  NECK:  Supple.  Normal carotid upstroke.  No carotid bruits.  LUNGS:  Clear.  HEART:  Regular rate and rhythm.  Normal S1 and S2.  ABDOMEN:  Soft.  EXTREMITIES:  No cyanosis, clubbing, or edema.   PROBLEMS:  1. Status post atypical chest pain, likely secondary to pleurisy.  2. Ruled out for pulmonary embolism.  3. No evidence of  significant ischemic heart disease despite multiple      risk factors.  4. Rule out hypertension.   PLAN:  1. At this point, the patient has no need for further cardiac workup.      She is asymptomatic.  2. The patient's blood pressure is elevated today although it appears      to be controlled during the hospitalization.  We will leave further      management and followup to [name].  3. From our standpoint, the patient can be seen on a p.r.n. basis.     Ernestine Mcmurray, MD,FACC  Electronically Signed    GED/MedQ  DD: 03/21/2009  DT: 03/22/2009  Job #: [id number]   cc:   [name], MD

## 2011-03-16 NOTE — Cardiovascular Report (Signed)
NAME:  Heather [name], Heather [name]                     ACCOUNT NO.:  [id number]   MEDICAL RECORD NO.:  [id number]                   PATIENT TYPE:  OIB   LOCATION:  6501                                 FACILITY:  [facility]   PHYSICIAN:  Junious Silk, M.D. [facility]         DATE OF BIRTH:  09/07/1951   DATE OF PROCEDURE:  07/17/2004  DATE OF DISCHARGE:                              CARDIAC CATHETERIZATION   PROCEDURE PERFORMED:  Left heart catheterization with coronary angiography  and left ventriculography.   INDICATION:  [name] is a 60 year old woman with multiple cardiac risk  factors including diabetes, hypertension and dyslipidemia.  She has had  recurrent episodes of substernal chest pain.  She has had normal Cardiolite  studies in the past as well as catheterization two years ago which showed no  significant coronary artery disease.  However, because of her recurrent  symptoms she was referred again fro cardiac catheterization to rule out the  development of significant coronary artery disease.   CATHETERIZATION PROCEDURAL NOTE:  A 4 French sheath was placed in the right  femoral artery.  Coronary angiography was performed with standard Judkins 4  French catheters.  Left ventriculography was performed with an angled  pigtail catheter.  Contrast was Omnipaque.  There were no complications.   CATHETERIZATION RESULTS:   HEMODYNAMICS:  1.  Left ventricular pressure 170/16.  2.  Aortic pressure 160/78.  3.  There is no aortic valve gradient.   LEFT VENTRICULOGRAM:  Wall motion is normal.  Ejection fraction is estimated  at greater than or equal to 65%.  There is no mitral regurgitation.   CORONARY ARTERIOGRAPHY (RIGHT DOMINANT):  Left main is normal.   Left anterior descending artery has minor luminal irregularities in the  distal vessel.  The LAD gives rise to a small first diagonal and large  second diagonal branch.   Left circumflex gives rise to a very large first obtuse  marginal and small  second obtuse marginal branch.  The left circumflex is normal.   Right coronary artery has a 20% stenosis proximally which may in part be due  to catheter related spasm.  The right coronary artery is otherwise normal  giving rise to a large posterior descending artery and a large posterior  lateral branch.   IMPRESSION:  1.  Normal left ventricular systolic function.  2.  No significant epicardial coronary artery disease identified.   RECOMMENDATIONS:  Medical therapy.  Would recommend consideration of a trial  of Imipramine for possible microvascular disease.      MWP/MEDQ  D:  07/17/2004  T:  07/17/2004  Job:  [id number]   cc:   [name], MD  [place]  [place] [address]  Fax: [facility] in [place]

## 2011-03-16 NOTE — H&P (Signed)
NAME:  Heather [name], Heather [name]                     ACCOUNT NO.:  [id number]   MEDICAL RECORD NO.:  [id number]                   PATIENT TYPE:   LOCATION:                                       FACILITY:  [facility]   PHYSICIAN:  Hildred Laser, M.D.                 DATE OF BIRTH:  16-Jan-1951   DATE OF ADMISSION:  DATE OF DISCHARGE:                                HISTORY & PHYSICAL   CHIEF COMPLAINT:  Chronic diarrhea, weight loss and abdominal pain.   HISTORY OF PRESENT ILLNESS:  The patient is a pleasant 60 year old lady who  presents today for further evaluation of the above-stated symptoms at the  request of [name].  She has been seen at our practice previously for  similar symptoms.  She states over the last two months she has developed  persistent diarrhea.  She is having upwards to 10-15 loose watery stools  daily.  Her stools are fairly yellow liquidity.  She denies any melena or  bright red blood per rectum.  She never has a solid stool.  She denies any  antibiotic therapy prior to the beginning of these symptoms.  She has not  had any new medication changes around the development of these symptoms as  well.  Yesterday she began having some nausea and vomiting.  She has had a  16-pound weight loss over the last couple of months.  She was hospitalized  recently due to hypokalemia related to her diarrhea.  Overall, her blood  glucose levels have been running reasonably well, less than 200.  She has  been tried on multiple medications for her diarrhea including Questran,  Bentyl, Levbid, Lomotil, and Imodium.  She denies any heartburn symptoms.   During recent hospitalization she had stool studies, which were negative for  C-diff, stool culture and O&P.  She denies any treatment of her diarrhea  with antibiotics.  Prior to the development of this diarrhea she was having  one to two bowel movements daily.  She states over the weekend she had a  temp of 101.2.   CURRENT MEDICATIONS:  1. Metoprolol 50 mg half tablet b.i.d.  2. Valium p.r.n.  3. Lasix 20 mg b.i.d. p.r.n.  4. Actos 30 mg q.d.  5. Levbid 0.375 mg p.r.n.  6. Glucophage 500 mg q.d.  7. Diovan 80 mg q.d.  8. Prilosec 40 mg q.d.  9. Anaprox DS 550 mg b.i.d.  10.      Ambien 2 mg p.r.n.  11.      K-Dur 20 mEq 2 daily.   ALLERGIES:  SULFA DRUGS.   PAST MEDICAL HISTORY:  1. Noninsulin-dependent diabetes mellitus.  2. Hypertension.  3. Gastroesophageal reflux disease.  4. Remote history of kidney stone in her 59s.  5. She has had atypical chest pain in February 2002, which was evaluated by     a stress echo and read as negative by Dr. Ron Parker.  6. She either had cervical or uterine cancer in her 60s  7. History of chemically-induced laryngitis diagnosed by Dr. Margart Sickles in 2001     at the time of laryngoscopy.   The patient has previously been seen during two hospitalizations; most  recently in November 2002 was for abdominal pain.  She was found to have  truly found to have left groin pain, which was discerned to be due to left  inguinal hernia based on CT.  She eventually underwent surgery for this.  Prior to that episode she was seen during hospitalization for suspected  gastritis.  Due to a long history of reflux she had an upper endoscopy done  on Mar 23, 2003 by Dr. Gala Romney, which revealed a small hiatal hernia, but  otherwise normal.  She had a colonoscopy in June 2001, which was done to  further evaluate Hemoccult positive stool described during the time of acute  gastritis; examination was normal.   PAST SURGICAL HISTORY:  1. Cholecystectomy in April 2002 for chronic cholecystitis and biliary     dyskinesia.  2. Benign right breast biopsy.  3. Partial hysterectomy in her 60s for cervical or uterine cancer.  She     believes she had an appendectomy at that time, but an appendiceal orifice     was noted on colonoscopy in 2001.  4. She had a cyst removed from around her urethra.   FAMILY HISTORY:   Mother had a history of female cancer, diabetes and  stroke.  She denies any family history of chronic GI illnesses or colorectal  cancer.   SOCIAL HISTORY:  She is married.  She has one living son.  Another son was  killed in a car accident in 2001.  She has never been a smoker. Denies any  alcohol use.  She is a Psychologist, counselling on the third floor at [employer]:  Please see HPI for GASTROINTESTINAL.  GENERAL:  She  complains of feeling very week and fatigued.  She has been having  intermittent fevers as outlined above.  CARDIOPULMONARY:  Denies any chest  pain, cough or shortness of breath.  GENITOURINARY:  Denies any dysuria,  hematuria or urinary frequency.   PHYSICAL EXAMINATION:  VITAL SIGNS:  Weight 193.75 pounds.  Blood pressure  100/70 and pulse 86.  She is afebrile.  GENERAL:  Pleasant well-nourished, well-developed Caucasian female in no  acute distress.  SKIN:  Warm and dry.  No jaundice.  HEENT:  Pupils equal, round and react to light.  Conjunctivae pink.  Sclerae  nonicteric.  Pharyngeal mucosa moist and pink, no lesions, erythema or  exudate.  NECK:  No lymphadenopathy, thyromegaly or carotid bruits.  CHEST:  Clear to auscultation.  CARDIAC EXAMINATION:  Reveals regular rate and rhythm.  Normal S1 and S2.  No murmurs, rubs or gallops.  ABDOMEN:  Positive bowel sounds, obese, but symmetrical.  Soft.  She has  mild tenderness in the left lower quadrant region.  In addition, she has  some tenderness in the left inguinal region,  but no evidence of  herniations.  No hepatomegaly or masses.  RECTAL EXAMINATION:  Reveals adequate sphincter tone.  No masses in the  rectal vault.  Secretions are Hemoccult negative.  EXTREMITIES:  No edema.   IMPRESSION:  The patient is a pleasant 60 year old lady who has a two-month  history of persistent diarrhea associated with weight loss and left lower quadrant abdominal pain.  Stool series have  been negative.  She does not  appear to have any risk factors suggestive of Clostridium difficile colitis.  She has not been camping and there has no travel abroad.  We need to make  sure that she does not have inflammatory bowel disease or even other forms  of colitis.  I am concerned that she is having left inguinal pain again,  which may be recurrent inguinal hernia.  This pain seems to be different  that her left lower quadrant pain.  Based on flexible sigmoidoscopy findings  could consider a repeat CT scan of this area.   PLAN:  1. CBC, MET 7 and TSH.  2. Flexible sigmoidoscopy.  3. Phenergan 25 mg p.o. q.4h. p.r.n. nausea, #20 given and refills given.  4. Encouraged her to drink plenty of fluids and stay hydrated.  5. She can continue Levbid as needed.  She required no new prescriptions for     loperamide or Lomotil as these previously did not help.  6. Flexible sigmoidoscopy will be done on Friday.  7. If she has an interval development of fever, severe abdominal pain or     worsening symptoms overall she will let us know, but as of now she is     stable for outpatient evaluation.   I would like to thank [name] for allowing [place] to take part in the care of  this patient.     Neil Crouch, P.A.                        Hildred Laser, M.D.    LL/MEDQ  D:  09/14/2002  T:  09/15/2002  Job:  [id number]

## 2011-03-16 NOTE — Op Note (Signed)
NAME:  Heather [name], Heather [name]                     ACCOUNT NO.:  [id number]   MEDICAL RECORD NO.:  [id number]                   PATIENT TYPE:  AMB   LOCATION:  DAY                                  FACILITY:  [facility]   PHYSICIAN:  Hildred Laser, M.D.                 DATE OF BIRTH:  1951-03-18   DATE OF PROCEDURE:  09/18/2002  DATE OF DISCHARGE:                                 OPERATIVE REPORT   PROCEDURE:  Flexible sigmoidoscopy.   ENDOSCOPIST:  Hildred Laser, M.D.   INDICATIONS:  Heather [name] is a 60 year old Caucasian female with an over 50-month  history of nonbloody diarrhea, associated with left-sided abdominal pain and  some weight loss.  Stool study has been negative.  She is suspected to have  IBS, but she has not responded to therapy.  She did have a colonoscopy last  year because of heme positive stools which was normal.  It was, therefore,  decided to do a flexible sigmoidoscopy.  The patient has requested IV  sedation.   Informed consent for the procedure was obtained from the patient.   PREOPERATIVE MEDICATIONS:  Demerol 100 mg IV and Versed 10 mg IV.   INSTRUMENT:  Olympus video system.   FINDINGS:  Procedure performed in endoscopy suite.  The patient's vital  signs and O2 saturation were monitored during the procedure and remained  stable.  Despite fairly high dose of conscious sedation, she was awake and  alert and not sedated at all and proceeded to do fairly well.  The patient  was placed in the left lateral recumbent position and rectal examination was  performed.  No abnormality noted on external or digital exam.   The scope was placed in the rectum and advanced under vision into the  sigmoid colon.  The scope was passed to about 40 cm.  She complained of pain  and further intubation was not attempted.  She also had some semi-formed  liquid stool in this area.  Mucosa of the sigmoid colon was normal.  Biopsy  was taken for routine histology.  The mucosa of the rectum  revealed normal  vascular pattern, but she had a small, slightly fleshy area without any  erosions.  Biopsy was also taken from the rectum and endoscope was  withdrawn.   The patient tolerated the procedure well.   FINAL DIAGNOSIS:  Normal flexible sigmoidoscopy.  No changes of endoscopic  colitis to explain her symptoms.    PLAN:  1. She will continue Levbid at 1 b.i.d.  2. Will contact the patient with biopsy results next week.  If biopsies are     normal will consider small-bowel follow through prior to considering     Lotronex.  Hildred Laser, M.D.    NR/MEDQ  D:  09/18/2002  T:  09/19/2002  Job:  [id number]   cc:   Sandford Craze  [place]  [place] [address]  Fax: [phone]

## 2011-03-16 NOTE — Discharge Summary (Signed)
. [facility]  Patient:    Heather [name], Heather [name] Visit Number: [id number] MRN: [id number]          Service Type: MED Location: 2000 2017 01 Attending Physician:  Coralie Keens Dictated by:   Davis Gourd, P.A.-C. Admit Date:  05/06/2002 Discharge Date: 05/07/2002   CC:         [name], M.D.  [facility] [address], [place], [place] [address]   Referring Physician Discharge [place]  DATE OF BIRTH:  1951/06/06  PROCEDURES: 1. Cardiac catheterization. 2. Coronary arteriogram. 3. Left ventriculogram.  HOSPITAL COURSE:  Ms. Crissman is a 60 year old female with no known history of  coronary artery disease, who was admitted to [facility] on May 04, 2002, for chest pain.  She had multiple risk factors for heart disease, and a cardiology consult was obtained.  Her cardiac risk factors included family history of premature coronary artery disease as well as diabetes, hypertension, and dyslipidemia.  Because of the progressive nature of her symptoms and her risk factors, it was felt that cath was indicated, and she was transferred to [facility] for this.  The patient had a cardiac catheterization on May 06, 2002, which showed trivial coronary artery disease with normal left ventricular function and an EF of greater than 65%.  Medical management was indicated.  The next day, she was afebrile, and her chest pain had resolved.  Her groin was stable and without ecchymosis, bruit, or hematoma.  A lipid profile was pending at the time of dictation but has been drawn.  She is otherwise considered stable for discharge with outpatient follow-up.  LABORATORY VALUES:  Sodium 139, potassium 3.7, chloride 105, CO2 28, BUN 10, creatinine 0.7, glucose 124.  Fasting lipid profile pending at the time of dictation.  DISCHARGE CONDITION:  Stable.  DISCHARGE DIAGNOSES:  1. Chest pain, no significant coronary artery disease by  cath and negative     D-dimer at [facility].  Follow up with primary care doctor.  2. Hypertension.  3. Non-insulin dependent diabetes mellitus.  4. Dyslipidemia.  5. Gastroesophageal reflux disease symptoms.  6. Family history of coronary artery disease.  7. History of cervical cancer, status post hysterectomy.  8. Status post cholecystectomy.  9. Status post bone spur removal on collar bone. 10. Status post hernia repair, cyst removal, tonsillectomy, and heel spurs. 11. History of hemorrhoids. 12. History of diarrhea at times. 13. History of stress incontinence.  DISCHARGE INSTRUCTIONS: 1. Her activity level is to include no driving, sexual, or strenuous activity    for two days. 2. She is to stick to a low fat diabetic diet. 3. She is to call the office for problems with the cath site. 4. She is to follow up with Dr. Domenic Polite on a p.r.n. basis, and she is to call    [name] for an appointment.  DISCHARGE MEDICATIONS: 1. Avandia 8 mg q.d. 2. Norvasc 5 mg q.d. 3. Prilosec 40 mg q.d. 4. Univasc 15 mg q.d. 5. Lasix 20 mg p.r.n. 7. Arthrotek 75/2 mg b.i.d. 8. Ambien p.r.n. Dictated by:   Davis Gourd, P.A.-C. Attending Physician:  Coralie Keens DD:  05/07/02 TD:  05/07/02 Job: [id number]

## 2011-03-16 NOTE — Cardiovascular Report (Signed)
[place]  Patient:    Heather [name], Heather [name] Visit Number: [id number] MRN: [id number]          Service Type: MED Location: 2000 2017 01 Attending Physician:  Coralie Keens Dictated by:   Christy Sartorius, M.D. Proc. Date: 05/06/02 Admit Date:  05/06/2002 Discharge Date: 05/07/2002   CC:         [name], M.D., Pleas Koch, M.D. [facility]   Cardiac Catheterization  PROCEDURES PERFORMED: 1. Left heart catheterization. 2. Left ventriculogram. 3. Selective coronary angiography.  DIAGNOSES: 1. Trivial coronary artery disease by angiogram. 2. Normal left ventricular systolic function.  HISTORY: The patient is a 60 year old female with a history of diabetes mellitus, who presents with progressive lower extremity edema and chest discomfort. The patient was admitted to the hospital and subsequently ruled out for acute myocardial infarction, and she presents for further cardiac assessment.  TECHNIQUE: After informed consent was obtained, the patient was brought to the cardiac catheterization lab where a 6s sheath was placed in the right femoral artery using the modified Seldinger technique. The 6 [place] and JR4 catheters were then used to engage the left and right coronary arteries then selective angiography performed in various projections using manual injections of contrast. A 6 French pigtail catheter was then advanced in the left ventricle and left ventriculogram performed using power injections of contrast. At the termination of the case, the catheters were removed and sheath secured in position, and the patient transferred to the ward in stable condition. The sheath will be removed after a sufficient time from her last dose of Lovenox.  FINDINGS: Findings are as follows: 1. Left main trunk: The left main trunk is a large caliber vessel,    angiographically normal. 2. LAD is a medium caliber vessel that bifurcates in its  distal    section and provides several small diagonal branches along its    course. There are mild irregularities in the distal LAD. 3. Left circumflex artery is a medium caliber vessel that provides    two marginal branches in the mid section. The left circumflex    system has luminal irregularities. 4. Right coronary artery is dominant. This is a large caliber vessel    that provides the posterior descending artery and two posterior ventricular    branches in its terminal segment. The right coronary artery has    luminal irregularities.  LEFT VENTRICULOGRAM: Normal end-systolic and end-diastolic dimensions. Overall left ventricular function is well preserved, ejection fraction of greater than 65%.  No mitral regurgitation. LV pressure is 110/10, aortic is 110/60, LVEDP equals 16.  ASSESSMENT AND PLAN: The patient is a 60 year old female with chest discomfort and lower extremity edema. She has trivial coronary artery disease and well preserved left ventricular function by cardiac catheterization. Continued medical therapy will be pursued. Dictated by:   Christy Sartorius, M.D. Attending Physician:  Coralie Keens DD:  05/06/02 TD:  05/09/02 Job: [id number]

## 2011-04-05 ENCOUNTER — Encounter: Payer: Self-pay | Admitting: Internal Medicine

## 2011-04-13 ENCOUNTER — Ambulatory Visit (INDEPENDENT_AMBULATORY_CARE_PROVIDER_SITE_OTHER): Payer: Medicare Other | Admitting: Internal Medicine

## 2011-04-13 ENCOUNTER — Encounter: Payer: Self-pay | Admitting: Internal Medicine

## 2011-04-13 VITALS — BP 142/80 | HR 72 | Ht 64.0 in | Wt 169.0 lb

## 2011-04-13 DIAGNOSIS — R112 Nausea with vomiting, unspecified: Secondary | ICD-10-CM

## 2011-04-13 DIAGNOSIS — Z1211 Encounter for screening for malignant neoplasm of colon: Secondary | ICD-10-CM

## 2011-04-13 DIAGNOSIS — K589 Irritable bowel syndrome without diarrhea: Secondary | ICD-10-CM

## 2011-04-13 DIAGNOSIS — Z9283 Personal history of failed moderate sedation: Secondary | ICD-10-CM

## 2011-04-13 DIAGNOSIS — R197 Diarrhea, unspecified: Secondary | ICD-10-CM

## 2011-04-13 DIAGNOSIS — R6881 Early satiety: Secondary | ICD-10-CM

## 2011-04-13 MED ORDER — PEG-KCL-NACL-NASULF-NA ASC-C 100 G PO SOLR: 1.0000 | Freq: Once | ORAL | Status: DC

## 2011-04-13 MED ORDER — COLESTIPOL HCL 1 G PO TABS: 4.0000 g | ORAL_TABLET | Freq: Every day | ORAL | Status: DC

## 2011-04-13 NOTE — Patient Instructions (Addendum)
You have been scheduled for an Endoscopy/Colonoscopy with separate instructions given. Your prep kit has been sent to your pharmacy for you to pick up. Your prescription(s) has(have) been sent to your pharmacy for you to pick up. Take the Colestid as directed.

## 2011-04-16 DIAGNOSIS — K5090 Crohn's disease, unspecified, without complications: Secondary | ICD-10-CM | POA: Insufficient documentation

## 2011-04-16 DIAGNOSIS — K589 Irritable bowel syndrome without diarrhea: Secondary | ICD-10-CM | POA: Insufficient documentation

## 2011-04-16 DIAGNOSIS — R112 Nausea with vomiting, unspecified: Secondary | ICD-10-CM | POA: Insufficient documentation

## 2011-04-16 DIAGNOSIS — Z9283 Personal history of failed moderate sedation: Secondary | ICD-10-CM | POA: Insufficient documentation

## 2011-04-16 DIAGNOSIS — R6881 Early satiety: Secondary | ICD-10-CM | POA: Insufficient documentation

## 2011-04-16 DIAGNOSIS — Z1211 Encounter for screening for malignant neoplasm of colon: Secondary | ICD-10-CM | POA: Insufficient documentation

## 2011-04-16 NOTE — Assessment & Plan Note (Signed)
Certainly sounds like she could have this - chronic diarrhea is suggestive and she had a colonoscopy 2002 - negative per her, for the diarrhea.

## 2011-04-16 NOTE — Progress Notes (Signed)
  Subjective:    Patient ID: Heather [name], female    DOB: 03-28-1951, 60 y.o.   MRN: [id number]  HPI 60 yo ww with multiple Gi complaints. She has chronic (x months at least) nausea problems and early satiety sensation with bloating. Some vomiting has occurred but not necessarily related to eating. She has had vertigo issues but does not identify that as a trigger and says that has been better to resolved. Using promethazine to relieve nausea and vomiting at times. She also has chronic loose and runny stools, that are not necessarily related to prior cholecystectomy, in her opinion. Blood sugars have been fluctuating and high - she is not sure why - has had DM x 9 years (diagnosed then). Has had nocturnal diarrhea. Can be urgent call to stool. Colonoscopy 2002 - no problems reported. No weight loss reported. No rectal bleeding, hematochezia or melena. Other GI ROS is negative.   Review of Systems + joint and back pain, headaches, insomnia, pedal edema, night sweats All other ROS negative or as per HPI    Objective:   Physical Exam  Constitutional: She is oriented to person, place, and time.       Appears a little older than stated but WDWN and NAD  HENT:  Head: Normocephalic and atraumatic.  Mouth/Throat: Oropharynx is clear and moist. No oropharyngeal exudate.  Eyes: Conjunctivae are normal. Pupils are equal, round, and reactive to light. No scleral icterus.  Neck: Normal range of motion. Neck supple. No thyromegaly present.       No cervical or supraclavicular adenoapthy  Cardiovascular: Normal rate, regular rhythm and normal heart sounds.  Exam reveals no gallop and no friction rub.   No murmur heard. Pulmonary/Chest: Effort normal and breath sounds normal.  Abdominal: Soft. Bowel sounds are normal. She exhibits no distension and no mass. There is tenderness. There is no guarding.       Mildly tender upper abdomen. No HSM  Genitourinary:       Rectal deferred  Musculoskeletal:  She exhibits no edema.  Neurological: She is alert and oriented to person, place, and time. No cranial nerve deficit.  Skin: Skin is warm and dry.  Psychiatric: She has a normal mood and affect. Her behavior is normal.          Assessment & Plan:

## 2011-04-16 NOTE — Assessment & Plan Note (Signed)
Lower GI sxs are chronic and previously evaluated. 10 years since last colonoscopy Will perform screening colonoscopy Risks and benefits explained, she understands and agrees to proceed.

## 2011-04-16 NOTE — Assessment & Plan Note (Addendum)
Etiology not clear. Certainly at risk for gastroparesis but pattern not totally consistent (vomiting not post-prandial) but last glucose I see was 352 - this could be causative or a result of problems.TSH is ok. ? If vertigo issues in background and related, also on multiple medications (appropriate but with multiple symptoms may be part of problems) With overall picture needs EGD Functional disturbance related to anxiety/de[ression always possible, too. Risks and benefits of EGD explained and she understands and agrees to proceed.

## 2011-04-16 NOTE — Assessment & Plan Note (Signed)
Functional vs. Obstructive/infiltrative process. Await EGD

## 2011-04-16 NOTE — Assessment & Plan Note (Signed)
She reports difficulty with prior sedation and inadequacy so will sedate using propofol with CRNA

## 2011-04-16 NOTE — Assessment & Plan Note (Addendum)
Chronic issue - suspect IBS vs. Post-chole TSH normal Trial of colestipol

## 2011-04-17 ENCOUNTER — Inpatient Hospital Stay: Payer: Self-pay | Admitting: Internal Medicine

## 2011-04-17 DIAGNOSIS — D509 Iron deficiency anemia, unspecified: Secondary | ICD-10-CM

## 2011-04-24 ENCOUNTER — Telehealth: Payer: Self-pay | Admitting: Internal Medicine

## 2011-04-24 NOTE — Telephone Encounter (Signed)
She reports that she is not at home and will call back. I have asked her to call back to [facility] and have them fax [place] there records since she just got the copies today

## 2011-04-24 NOTE — Telephone Encounter (Signed)
Phone is busy I will continue to try and reach the patient

## 2011-04-24 NOTE — Telephone Encounter (Signed)
Ask her to read back the report findings if she can do that (first) We do not need to cancel colonoscopy yet but suspect we will and keep the egd She could go to [facility] medical records and tell them to send [place] the colonoscopy reports, xray reports, pathology, h%P and dc summaries also, if possible for her to get there

## 2011-04-24 NOTE — Telephone Encounter (Signed)
I spoke with the patient she was admitted to [facility] due to abdominal pain and epigastric pain. She is scheduled for propofol endo/colon on 05/01/11. She is asking if she still needs the procedures. She was also given units of blood for anemia, she is not sure what from.   She says they did the colon while she was admitted.  I have asked her to get me all of the medical records asap.  She says she has a copy of them and I have asked her to bring them or fax them to me ASAP, she says she can't come here and doesn't have a fax.  I have asked her to mail them to me.  [facility] will not send them without a release.  Dr Carlean Purl do you want me to just change the procedure to an EGD or cancel both until you can review the records?

## 2011-04-25 NOTE — Telephone Encounter (Signed)
The patient brought the records by the office.  There is no colon report.  I had her sign a release and faxed it to [place] called me to report that the d/c summary was not ready yet they will send it as soon as it is dictated, but the patient did not have a colon due to problems with her BP.  I will contact the patient and advise her that we will keep both procedures on as planned for next week. The remainder of the records will be in your office

## 2011-04-25 NOTE — Telephone Encounter (Signed)
I have left a message for the patient reminding her I need the records asap and to call me with the reports.

## 2011-04-25 NOTE — Telephone Encounter (Signed)
ok 

## 2011-04-26 ENCOUNTER — Encounter: Payer: Self-pay | Admitting: Internal Medicine

## 2011-04-26 NOTE — Telephone Encounter (Signed)
Error

## 2011-05-01 ENCOUNTER — Encounter: Payer: Medicare Other | Admitting: Internal Medicine

## 2011-05-07 ENCOUNTER — Telehealth: Payer: Self-pay | Admitting: Internal Medicine

## 2011-05-07 NOTE — Telephone Encounter (Signed)
Patient advised that they still have not sent [place] copies of the colon that she reports she had at [facility].  I have asked her to contact them and ask them to send [place] a copy of the report.  According to Medical Records at [facility] the procedure was not performed, but the patient is insistent that it was.  She will have them send [place] the records

## 2011-05-08 ENCOUNTER — Telehealth: Payer: Self-pay

## 2011-05-08 NOTE — Telephone Encounter (Signed)
Patient has had the colon report faxed to [place] from [facility].  I have put a copy on your desk of the colon and path.  Please advise if needs to repeat colon with EGD.

## 2011-05-09 ENCOUNTER — Other Ambulatory Visit: Payer: Self-pay | Admitting: Internal Medicine

## 2011-05-09 DIAGNOSIS — Q2730 Arteriovenous malformation, site unspecified: Secondary | ICD-10-CM

## 2011-05-09 NOTE — Telephone Encounter (Signed)
1) I think a repeat colonoscopy with possible APC appropriate as she had a GI bleed and the MD did not treat the AVM which could have caused the bleeding 2) EGD also  Would do with propofol at hospital - I guess during hospital week 9next one)

## 2011-05-09 NOTE — Telephone Encounter (Signed)
Patient is scheduled for colon/endo with APC and Propofol at [facility] 05/29/11 9:45.  I have reviewed the colon/endo instructions with her.

## 2011-05-10 ENCOUNTER — Encounter: Payer: Self-pay | Admitting: Internal Medicine

## 2011-05-10 DIAGNOSIS — D509 Iron deficiency anemia, unspecified: Secondary | ICD-10-CM

## 2011-05-10 NOTE — Discharge Summary (Unsigned)
Physician Discharge Summary  Patient ID: Heather [name] MRN: [id number] DOB/AGE: Mar 12, 1951 60 y.o.  Admit date: 04/17/2011 Discharge date: 05/10/2011  Admission Diagnoses:  Discharge Diagnoses:  Active Problems:  * No active hospital problems. *    Discharged Condition: {condition:18240}  Hospital Course: ***  Consults: {consultation:18241}  Significant Diagnostic Studies: {diagnostics:18242}  Treatments: {Tx:18249}  Discharge Exam: There were no vitals taken for this visit. {physical SA:931536  Disposition: Home or Self Care  Discharge Orders    Future Appointments: Provider: Department: Dept Phone: Center:   05/29/2011 9:45 AM Gatha Mayer, [facility] [phone] None     Cannot display discharge medications since this is not an admission.    Signed: Silvano Rusk 05/10/2011, 5:01 PM

## 2011-05-10 NOTE — Progress Notes (Signed)
  Note that she had an elevated lipase of 76 with top normal in the 50s but at [facility]. Etiology this is unclear. She also had a colonoscopy with inflammatory changes of the ileocecal valve an AVM there that was not treated. Hemoglobin fell to 7.6 with heme positive stools and that's why the colonoscopy is performed. I cannot tell check she had melena or hematochezia from their documentation review of the hospitalization and her head from June 19 to 04/21/2011.

## 2011-05-10 NOTE — Discharge Summary (Signed)
From review of records at [facility]. She was admitted 04/17/2011 with abdominal pain. Her hemoglobin was 9.7. CT scan of the abdomen and pelvis without IV contrast  was unrevealing. She had a colonoscopy which showed some inflammatory changes at the ileocecal valve as well as an AVM that was not bleeding. Hemoglobin fell into the 8 range. I base and amylase were normal. Her iron saturation was 12% with TIBC 372 and iron 44. At 1023. Hemoglobin fell further to 7.6 and she was transfused one unit of red cells. There is some confusion as to whether she had an EGD. I don't think she did. Her discharge hemoglobin was 9.6.

## 2011-05-29 ENCOUNTER — Other Ambulatory Visit: Payer: Self-pay | Admitting: Internal Medicine

## 2011-05-29 ENCOUNTER — Ambulatory Visit (HOSPITAL_COMMUNITY)
Admission: RE | Admit: 2011-05-29 | Discharge: 2011-05-29 | Disposition: A | Discharge status: Home / Self Care | Payer: Medicare Other | Source: Ambulatory Visit | Attending: Internal Medicine | Admitting: Internal Medicine

## 2011-05-29 ENCOUNTER — Encounter: Payer: Medicare Other | Admitting: Internal Medicine

## 2011-05-29 DIAGNOSIS — R197 Diarrhea, unspecified: Secondary | ICD-10-CM | POA: Insufficient documentation

## 2011-05-29 DIAGNOSIS — R112 Nausea with vomiting, unspecified: Secondary | ICD-10-CM

## 2011-05-29 DIAGNOSIS — D649 Anemia, unspecified: Secondary | ICD-10-CM | POA: Insufficient documentation

## 2011-05-29 DIAGNOSIS — R195 Other fecal abnormalities: Secondary | ICD-10-CM

## 2011-05-29 DIAGNOSIS — K5289 Other specified noninfective gastroenteritis and colitis: Secondary | ICD-10-CM | POA: Insufficient documentation

## 2011-05-29 DIAGNOSIS — K5180 Other ulcerative colitis without complications: Secondary | ICD-10-CM | POA: Insufficient documentation

## 2011-05-29 DIAGNOSIS — K633 Ulcer of intestine: Secondary | ICD-10-CM

## 2011-05-29 HISTORY — PX: ESOPHAGOGASTRODUODENOSCOPY: SHX1529

## 2011-05-29 LAB — GLUCOSE, CAPILLARY
Glucose-Capillary: 102 mg/dL — ABNORMAL HIGH (ref 70–99)
Glucose-Capillary: 100 mg/dL — ABNORMAL HIGH (ref 70–99)

## 2011-06-04 ENCOUNTER — Telehealth: Payer: Self-pay | Admitting: Internal Medicine

## 2011-06-04 ENCOUNTER — Telehealth: Payer: Self-pay

## 2011-06-04 ENCOUNTER — Encounter: Payer: Self-pay | Admitting: Internal Medicine

## 2011-06-04 DIAGNOSIS — K5090 Crohn's disease, unspecified, without complications: Secondary | ICD-10-CM

## 2011-06-04 MED ORDER — BUDESONIDE 3 MG PO CP24: 9.0000 mg | ORAL_CAPSULE | ORAL | Status: DC

## 2011-06-04 NOTE — Telephone Encounter (Signed)
Pt states that she is still having pain in the top of her stomach. Pt states that every time she eats she throws up. Pt requesting some medication to help with this. Please advise.

## 2011-06-04 NOTE — Telephone Encounter (Signed)
Pt aware. Pt scheduled to see Dr. Carlean Purl 07/05/11@11 :30am. Pt aware of appt date and time.

## 2011-06-04 NOTE — Assessment & Plan Note (Signed)
Start Entocort EC for suspected Crohn's

## 2011-06-04 NOTE — Telephone Encounter (Signed)
Notes Recorded by Gatha Mayer, MD on 06/04/2011 at 6:38 AM She may have crohn's disease - a chronic inflammation of intestines - there is treatment - I want to review her CT films from [facility] admit in June and think I can with radiologists - will call her back after that 10 year colon recall   Pt aware.

## 2011-06-04 NOTE — Progress Notes (Signed)
Quick Note:  She may have crohn's disease - a chronic inflammation of intestines - there is treatment - I want to review her CT films from [facility] admit in June and think I can with radiologists - will call her back after that 10 year colon recall ______

## 2011-06-04 NOTE — Telephone Encounter (Signed)
Addended by: Gatha Mayer on: 06/04/2011 03:37 PM   Modules accepted: Orders

## 2011-06-04 NOTE — Telephone Encounter (Signed)
The colon biopsies suggest she may have Crohn's disease - inflammatory condition of the bowels  This may causing a lot of her GI problems  Will start Entocort EC and see her in 1 month Rx sent

## 2011-06-07 ENCOUNTER — Encounter: Payer: Self-pay | Admitting: Internal Medicine

## 2011-06-07 DIAGNOSIS — K633 Ulcer of intestine: Secondary | ICD-10-CM | POA: Insufficient documentation

## 2011-06-07 NOTE — Progress Notes (Signed)
Quick Note:  Need to arrange small bowel capsule endoscopy - intestinal ulceration, possible Crohn's and also use heme + stool 792.1 as a visit dx for the order ______

## 2011-06-08 ENCOUNTER — Telehealth: Payer: Self-pay | Admitting: *Deleted

## 2011-06-08 NOTE — Telephone Encounter (Signed)
Need to arrange small bowel capsule endoscopy - intestinal ulceration, possible Crohn's and also use heme + stool 792.1 as a visit dx for the order Notes Recorded by Gatha Mayer, MD on 06/04/2011 at 6:38 AM She may have crohn's disease - a chronic inflammation of intestines - there is treatment - I want to review her CT films from [facility] admit in June and think I can with radiologists - will call her back after that 10 year colon recall

## 2011-06-08 NOTE — Telephone Encounter (Signed)
Left a message for patient to call me to schedule test.

## 2011-06-11 NOTE — Telephone Encounter (Signed)
Spoke with patient's husband and he states the patient is in [facility] room 317. States she went in with upper chest pain and vomiting.

## 2011-06-11 NOTE — Telephone Encounter (Signed)
Dr Carlean Purl do you want to proceed with Capsule when she gets out of [facility] hosp or wait until records reviewed.  Please advise

## 2011-06-12 NOTE — Telephone Encounter (Signed)
I have left a message for the patient asking her to please call once she is released from [facility] hospital to discuss records

## 2011-06-12 NOTE — Telephone Encounter (Signed)
Do not perform capsule until records from hospitalization are reviewed

## 2011-07-05 ENCOUNTER — Encounter: Payer: Self-pay | Admitting: Internal Medicine

## 2011-07-05 ENCOUNTER — Ambulatory Visit (INDEPENDENT_AMBULATORY_CARE_PROVIDER_SITE_OTHER): Payer: Medicare Other | Admitting: Internal Medicine

## 2011-07-05 DIAGNOSIS — K589 Irritable bowel syndrome without diarrhea: Secondary | ICD-10-CM

## 2011-07-05 DIAGNOSIS — D509 Iron deficiency anemia, unspecified: Secondary | ICD-10-CM

## 2011-07-05 DIAGNOSIS — K5090 Crohn's disease, unspecified, without complications: Secondary | ICD-10-CM

## 2011-07-05 DIAGNOSIS — E538 Deficiency of other specified B group vitamins: Secondary | ICD-10-CM

## 2011-07-05 DIAGNOSIS — R112 Nausea with vomiting, unspecified: Secondary | ICD-10-CM

## 2011-07-05 DIAGNOSIS — H8110 Benign paroxysmal vertigo, unspecified ear: Secondary | ICD-10-CM

## 2011-07-05 NOTE — Patient Instructions (Addendum)
Stop metoclopramide and stop Entocort or budesonide. They were removed from your list. Do not take indomethacin. ED gastroparesis diet for delayed stomach emptying and start with depth 1 food groups. If you're tolerating those tri-step to food groups. Keep your blood sugars under control, this is very important. Once I see the records of the gastric emptying studies we will contact you with any next steps and plants.

## 2011-07-05 NOTE — Assessment & Plan Note (Addendum)
I suspect she may not have this. At this point she seems to have failed a trial of Entocort and ordered the hospitalization with steroids. I'm going to stop that medication for the time being. She could still need a capsule endoscopy but I'm going to look at the gastric emptying studies first. She did have delayed gastric emptying. Do not know if was on narcotics then. I still think a capsule endo of small bowel would be useful but would need to place the capsule with scope given gastroparesis. Will arrange.

## 2011-07-05 NOTE — Assessment & Plan Note (Addendum)
She may have gastroparesis. It turns out she has gastric emptying studies in the past. She does not know the results. I did review the records from her recent hospitalization she did not respond to steroids at all. Metoclopramide has not really helped her so we'll discontinue that. GES 05/18/11 was abnormal - looks like she was an inpatient Has also had vertigo (on problem list) Will query her about this

## 2011-07-05 NOTE — Progress Notes (Signed)
  Subjective:    Patient ID: Heather [name], female    DOB: November 12, 1950, 60 y.o.   MRN: [id number]  HPI She feels bloated and vomits after every time she eats, despite small intake. Entocort was prescribed earlier this month when I found terminal ileal erosions on colonoscopy one month ago and so far it has not made a difference. I had thought she might have Crohn's disease. A capsule endoscopy of small bowel was planned but she was  hospitalized at [facility] in the interim for abdominal pain and nausea and vomiting. The gastroenterologist there saw her - Dr. Britta Mccreedy, and treated with IV solumedrol. She improved. He appropriately thought terminal ileal erosions could have been from NSAIDS. ESR 47, CRP 0.58. Blood sugars frequently greater than 300. Hypokalemia found and treated. Hgb 8-10. Stool heme negative.Metaclopramide prescribed, she is still taking and says not helpful. She is sleepy on this.  She tells me she has had a gastric emptying study in the past. Requested report and reviewed after visit - 05/18/2011 2 hour emptying was 61% retention which is abnormal.  Past Medical History  Diagnosis Date  . Cellulitis and abscess     abdomen and buttocks  . Renal failure, unspecified   . Unspecified tinnitus     right  . Edema   . Diabetes mellitus   . Essential hypertension   . Headache   . Irritable bowel syndrome   . Disorders of phosphorus metabolism   . Palpitations   . Depression   . Osteoarthrosis, unspecified whether generalized or localized, unspecified site   . B12 deficiency   . Pure hypercholesterolemia   . Bronchitis, not specified as acute or chronic   . Reflux esophagitis   . Diabetic neuropathy   . Gout   . Hypopotassemia   . Folliculitis   . Insomnia   . DJD (degenerative joint disease)     right forminal stenosis C4-5   Past Surgical History  Procedure Date  . C-spine surgery     03/2007  . Cardiac catheterization   . Total abdominal hysterectomy     . Cholecystectomy   . Right knee arthroscopic surgery   . Colonoscopy 04/21/2011; 05/29/11    6/12: [facility] - ?AVM at IC valve, inflammatory changes at [facility] valve [facility]); 7/12 - [facility]; IC valve erosions, look chronic and probably Crohn's per path  . Esophagogastroduodenoscopy 05/29/11    normal    reports that she has never smoked. She has never used smokeless tobacco. She reports that she does not drink alcohol or use illicit drugs. family history includes Diabetes in her mother.  There is no history of Colon cancer. Allergies  Allergen Reactions  . Amlodipine Besylate Swelling  . Ciprofloxacin Nausea Only and Swelling  . Metformin Other (See Comments)    REACTION: GI UPSET  . Sulfonamide Derivatives   . Lisinopril Swelling and Rash        Review of Systems As above    Objective:   Physical Exam NAD Abdomen soft and nontender BS + , no splash       Assessment & Plan:  See problem-oriented charting below as well. An incredibly complicated situation with multiple possible causes of her problems. I understand that Crohn's may not be the problem and that NSAID colitis could be. She does also have iron-def anemia and B12 def which support dx of Crohn's. Delayed gastric emptying on GES could have been medication related which often is when in the hospital.

## 2011-07-05 NOTE — Assessment & Plan Note (Addendum)
She does have ? Of inflammatory bowel disease. Even if she does I think she has IBS also.

## 2011-07-08 ENCOUNTER — Encounter: Payer: Self-pay | Admitting: Internal Medicine

## 2011-07-08 DIAGNOSIS — E538 Deficiency of other specified B group vitamins: Secondary | ICD-10-CM | POA: Insufficient documentation

## 2011-07-08 NOTE — Assessment & Plan Note (Signed)
Will have her get repeat CBC and ferritin

## 2011-07-08 NOTE — Assessment & Plan Note (Signed)
Not discussed and not realized as part of her problem set until now. Could be part of nausea and vomiting. Will call her and ask about it.

## 2011-07-09 ENCOUNTER — Telehealth: Payer: Self-pay

## 2011-07-09 DIAGNOSIS — R195 Other fecal abnormalities: Secondary | ICD-10-CM

## 2011-07-09 DIAGNOSIS — D509 Iron deficiency anemia, unspecified: Secondary | ICD-10-CM

## 2011-07-09 NOTE — Telephone Encounter (Signed)
Message copied by Marlon Pel on Mon Jul 09, 2011 10:39 AM ------      Message from: Gatha Mayer      Created: Sun Jul 08, 2011 10:06 AM      Regarding: info needed and labs       1) See if we can find out if she was hospitalized (I think she was) and if so what meds she was on when she had 05/18/11 gastric emptying scan. ? If was on promethazine or narcotics. Do the best you can - may need to start by asking her. If not ? Try hospital med records            2) She needs a CBC and a ferritin now for iron def anemia            3) I need to arrange for her to have a capsule endoscopy at hospital during my hospital week - would need to place it with scope due to delayed gastric emptying issues - re: heme + stool, iron def anemia - best with popofol            4) ask  Her when she last took indomethacin or any other NSAID's (want her off these)

## 2011-07-09 NOTE — Telephone Encounter (Signed)
I spoke with the patient . She was outpatient during [facility] and she is not sure of medications.    She is scheduled for Endo with capsule placement under propofol on 07/17/11 9:30 at [facility].  I will mail her instructions.  She has a sick relative and can't come for lab this week, she will come the morning of her procedure.    She has avoided all NSAIDS for the last 5 months.

## 2011-07-15 ENCOUNTER — Emergency Department (HOSPITAL_COMMUNITY)
Admission: EM | Admit: 2011-07-15 | Discharge: 2011-07-16 | Disposition: A | Discharge status: Home / Self Care | Payer: Medicare Other | Attending: Emergency Medicine | Admitting: Emergency Medicine

## 2011-07-15 DIAGNOSIS — I10 Essential (primary) hypertension: Secondary | ICD-10-CM | POA: Insufficient documentation

## 2011-07-15 DIAGNOSIS — E119 Type 2 diabetes mellitus without complications: Secondary | ICD-10-CM | POA: Insufficient documentation

## 2011-07-15 DIAGNOSIS — Z79899 Other long term (current) drug therapy: Secondary | ICD-10-CM | POA: Insufficient documentation

## 2011-07-15 DIAGNOSIS — R109 Unspecified abdominal pain: Secondary | ICD-10-CM | POA: Insufficient documentation

## 2011-07-15 DIAGNOSIS — R10816 Epigastric abdominal tenderness: Secondary | ICD-10-CM | POA: Insufficient documentation

## 2011-07-15 DIAGNOSIS — Z8719 Personal history of other diseases of the digestive system: Secondary | ICD-10-CM | POA: Insufficient documentation

## 2011-07-15 LAB — COMPREHENSIVE METABOLIC PANEL
AST: 15 U/L (ref 0–37)
Albumin: 3.1 g/dL — ABNORMAL LOW (ref 3.5–5.2)
BUN: 22 mg/dL (ref 6–23)
CO2: 29 mEq/L (ref 19–32)
Calcium: 9.1 mg/dL (ref 8.4–10.5)
Chloride: 97 mEq/L (ref 96–112)
Creatinine, Ser: 1.39 mg/dL — ABNORMAL HIGH (ref 0.50–1.10)
GFR calc non Af Amer: 39 mL/min — ABNORMAL LOW (ref >60)
Total Bilirubin: 0.3 mg/dL (ref 0.3–1.2)

## 2011-07-15 LAB — DIFFERENTIAL
Eosinophils Absolute: 0.1 10*3/uL (ref 0.0–0.7)
Eosinophils Relative: 1 % (ref 0–5)
Lymphocytes Relative: 22 % (ref 12–46)
Lymphs Abs: 1.9 10*3/uL (ref 0.7–4.0)
Monocytes Relative: 6 % (ref 3–12)

## 2011-07-15 LAB — URINALYSIS, ROUTINE W REFLEX MICROSCOPIC
Glucose, UA: NEGATIVE mg/dL
Hgb urine dipstick: NEGATIVE
Ketones, ur: NEGATIVE mg/dL
Protein, ur: NEGATIVE mg/dL
pH: 5.5 (ref 5.0–8.0)

## 2011-07-15 LAB — CBC
HCT: 31.8 % — ABNORMAL LOW (ref 36.0–46.0)
MCH: 28.2 pg (ref 26.0–34.0)
MCV: 84.6 fL (ref 78.0–100.0)
Platelets: 190 10*3/uL (ref 150–400)
RDW: 14.8 % (ref 11.5–15.5)

## 2011-07-15 LAB — LIPASE, BLOOD: Lipase: 66 U/L — ABNORMAL HIGH (ref 11–59)

## 2011-07-17 ENCOUNTER — Inpatient Hospital Stay (HOSPITAL_COMMUNITY)
Admission: RE | Admit: 2011-07-17 | Discharge: 2011-07-22 | DRG: 387 | Disposition: A | Discharge status: Home / Self Care | Payer: Medicare Other | Source: Ambulatory Visit | Attending: Internal Medicine | Admitting: Internal Medicine

## 2011-07-17 ENCOUNTER — Encounter: Payer: Medicare Other | Admitting: Internal Medicine

## 2011-07-17 DIAGNOSIS — E876 Hypokalemia: Secondary | ICD-10-CM

## 2011-07-17 DIAGNOSIS — M109 Gout, unspecified: Secondary | ICD-10-CM | POA: Diagnosis present

## 2011-07-17 DIAGNOSIS — R109 Unspecified abdominal pain: Secondary | ICD-10-CM

## 2011-07-17 DIAGNOSIS — K5090 Crohn's disease, unspecified, without complications: Principal | ICD-10-CM | POA: Diagnosis present

## 2011-07-17 DIAGNOSIS — E860 Dehydration: Secondary | ICD-10-CM | POA: Diagnosis present

## 2011-07-17 DIAGNOSIS — E1142 Type 2 diabetes mellitus with diabetic polyneuropathy: Secondary | ICD-10-CM | POA: Diagnosis present

## 2011-07-17 DIAGNOSIS — D6489 Other specified anemias: Secondary | ICD-10-CM | POA: Diagnosis present

## 2011-07-17 DIAGNOSIS — K3184 Gastroparesis: Secondary | ICD-10-CM | POA: Diagnosis present

## 2011-07-17 DIAGNOSIS — F3289 Other specified depressive episodes: Secondary | ICD-10-CM | POA: Diagnosis present

## 2011-07-17 DIAGNOSIS — R197 Diarrhea, unspecified: Secondary | ICD-10-CM

## 2011-07-17 DIAGNOSIS — I10 Essential (primary) hypertension: Secondary | ICD-10-CM | POA: Diagnosis present

## 2011-07-17 DIAGNOSIS — R112 Nausea with vomiting, unspecified: Secondary | ICD-10-CM | POA: Diagnosis present

## 2011-07-17 DIAGNOSIS — F411 Generalized anxiety disorder: Secondary | ICD-10-CM | POA: Diagnosis present

## 2011-07-17 DIAGNOSIS — R404 Transient alteration of awareness: Secondary | ICD-10-CM | POA: Diagnosis not present

## 2011-07-17 DIAGNOSIS — Z794 Long term (current) use of insulin: Secondary | ICD-10-CM

## 2011-07-17 DIAGNOSIS — I959 Hypotension, unspecified: Secondary | ICD-10-CM | POA: Diagnosis present

## 2011-07-17 DIAGNOSIS — F329 Major depressive disorder, single episode, unspecified: Secondary | ICD-10-CM | POA: Diagnosis present

## 2011-07-17 DIAGNOSIS — R4182 Altered mental status, unspecified: Secondary | ICD-10-CM | POA: Diagnosis not present

## 2011-07-17 DIAGNOSIS — E1149 Type 2 diabetes mellitus with other diabetic neurological complication: Secondary | ICD-10-CM | POA: Diagnosis present

## 2011-07-17 DIAGNOSIS — E1169 Type 2 diabetes mellitus with other specified complication: Secondary | ICD-10-CM | POA: Diagnosis present

## 2011-07-17 LAB — BASIC METABOLIC PANEL
CO2: 29 mEq/L (ref 19–32)
Chloride: 95 mEq/L — ABNORMAL LOW (ref 96–112)
Glucose, Bld: 162 mg/dL — ABNORMAL HIGH (ref 70–99)
Potassium: 2.4 mEq/L — CL (ref 3.5–5.1)
Sodium: 138 mEq/L (ref 135–145)

## 2011-07-17 LAB — CBC
Hemoglobin: 11 g/dL — ABNORMAL LOW (ref 12.0–15.0)
Platelets: 188 10*3/uL (ref 150–400)
RBC: 3.86 MIL/uL — ABNORMAL LOW (ref 3.87–5.11)
WBC: 8.9 10*3/uL (ref 4.0–10.5)

## 2011-07-17 LAB — DIFFERENTIAL
Basophils Relative: 0 % (ref 0–1)
Eosinophils Absolute: 0.1 10*3/uL (ref 0.0–0.7)
Monocytes Relative: 7 % (ref 3–12)
Neutro Abs: 6.1 10*3/uL (ref 1.7–7.7)
Neutrophils Relative %: 68 % (ref 43–77)

## 2011-07-17 LAB — GLUCOSE, CAPILLARY
Glucose-Capillary: 219 mg/dL — ABNORMAL HIGH (ref 70–99)
Glucose-Capillary: 188 mg/dL — ABNORMAL HIGH (ref 70–99)
Glucose-Capillary: 202 mg/dL — ABNORMAL HIGH (ref 70–99)

## 2011-07-18 DIAGNOSIS — R112 Nausea with vomiting, unspecified: Secondary | ICD-10-CM

## 2011-07-18 DIAGNOSIS — R109 Unspecified abdominal pain: Secondary | ICD-10-CM

## 2011-07-18 DIAGNOSIS — R197 Diarrhea, unspecified: Secondary | ICD-10-CM

## 2011-07-18 LAB — COMPREHENSIVE METABOLIC PANEL WITH GFR
ALT: 15 U/L (ref 0–35)
AST: 12 U/L (ref 0–37)
Albumin: 2.6 g/dL — ABNORMAL LOW (ref 3.5–5.2)
Alkaline Phosphatase: 73 U/L (ref 39–117)
BUN: 10 mg/dL (ref 6–23)
CO2: 24 meq/L (ref 19–32)
Calcium: 7.9 mg/dL — ABNORMAL LOW (ref 8.4–10.5)
Chloride: 102 meq/L (ref 96–112)
Creatinine, Ser: 1.1 mg/dL (ref 0.50–1.10)
GFR calc Af Amer: >60 mL/min
GFR calc non Af Amer: 51 mL/min — ABNORMAL LOW
Glucose, Bld: 193 mg/dL — ABNORMAL HIGH (ref 70–99)
Potassium: 4.1 meq/L (ref 3.5–5.1)
Sodium: 136 meq/L (ref 135–145)
Total Bilirubin: 0.3 mg/dL (ref 0.3–1.2)
Total Protein: 6 g/dL (ref 6.0–8.3)

## 2011-07-18 LAB — GLUCOSE, CAPILLARY
Glucose-Capillary: 167 mg/dL — ABNORMAL HIGH (ref 70–99)
Glucose-Capillary: 442 mg/dL — ABNORMAL HIGH (ref 70–99)
Glucose-Capillary: 381 mg/dL — ABNORMAL HIGH (ref 70–99)

## 2011-07-18 LAB — CBC
Hemoglobin: 9.6 g/dL — ABNORMAL LOW (ref 12.0–15.0)
MCH: 28.5 pg (ref 26.0–34.0)
Platelets: 180 10*3/uL (ref 150–400)
RBC: 3.37 MIL/uL — ABNORMAL LOW (ref 3.87–5.11)
WBC: 6.9 10*3/uL (ref 4.0–10.5)

## 2011-07-19 DIAGNOSIS — K633 Ulcer of intestine: Secondary | ICD-10-CM

## 2011-07-19 LAB — GLUCOSE, CAPILLARY: Glucose-Capillary: 204 mg/dL — ABNORMAL HIGH (ref 70–99)

## 2011-07-20 DIAGNOSIS — R112 Nausea with vomiting, unspecified: Secondary | ICD-10-CM

## 2011-07-20 DIAGNOSIS — R197 Diarrhea, unspecified: Secondary | ICD-10-CM

## 2011-07-20 DIAGNOSIS — K633 Ulcer of intestine: Secondary | ICD-10-CM

## 2011-07-20 DIAGNOSIS — R109 Unspecified abdominal pain: Secondary | ICD-10-CM

## 2011-07-20 LAB — BASIC METABOLIC PANEL
BUN: 17 mg/dL (ref 6–23)
Calcium: 8.7 mg/dL (ref 8.4–10.5)
GFR calc Af Amer: 40 mL/min — ABNORMAL LOW (ref >60)
GFR calc non Af Amer: 33 mL/min — ABNORMAL LOW (ref >60)
Potassium: 5.6 mEq/L — ABNORMAL HIGH (ref 3.5–5.1)
Sodium: 134 mEq/L — ABNORMAL LOW (ref 135–145)

## 2011-07-20 LAB — GLUCOSE, CAPILLARY: Glucose-Capillary: 118 mg/dL — ABNORMAL HIGH (ref 70–99)

## 2011-07-21 LAB — BASIC METABOLIC PANEL
BUN: 15 mg/dL (ref 6–23)
Chloride: 106 mEq/L (ref 96–112)
Creatinine, Ser: 1.46 mg/dL — ABNORMAL HIGH (ref 0.50–1.10)
GFR calc Af Amer: 44 mL/min — ABNORMAL LOW (ref >60)
Glucose, Bld: 60 mg/dL — ABNORMAL LOW (ref 70–99)
Potassium: 5.2 mEq/L — ABNORMAL HIGH (ref 3.5–5.1)

## 2011-07-21 LAB — GLUCOSE, CAPILLARY
Glucose-Capillary: 50 mg/dL — ABNORMAL LOW (ref 70–99)
Glucose-Capillary: 83 mg/dL (ref 70–99)
Glucose-Capillary: 148 mg/dL — ABNORMAL HIGH (ref 70–99)
Glucose-Capillary: 105 mg/dL — ABNORMAL HIGH (ref 70–99)

## 2011-07-22 DIAGNOSIS — K633 Ulcer of intestine: Secondary | ICD-10-CM

## 2011-07-22 DIAGNOSIS — R197 Diarrhea, unspecified: Secondary | ICD-10-CM

## 2011-07-22 DIAGNOSIS — R112 Nausea with vomiting, unspecified: Secondary | ICD-10-CM

## 2011-07-22 DIAGNOSIS — R109 Unspecified abdominal pain: Secondary | ICD-10-CM

## 2011-07-22 LAB — GLUCOSE, CAPILLARY
Glucose-Capillary: 51 mg/dL — ABNORMAL LOW (ref 70–99)
Glucose-Capillary: 85 mg/dL (ref 70–99)

## 2011-07-23 ENCOUNTER — Encounter: Payer: Self-pay | Admitting: Internal Medicine

## 2011-08-02 NOTE — Discharge Summary (Addendum)
NAMEMINAAL, BOMMER NO.:  [id number]  MEDICAL RECORD NO.:  [id number]  LOCATION:  33                         FACILITY:  [facility]  PHYSICIAN:  Gatha Mayer, MD,FACGDATE OF BIRTH:  1951/01/03  DATE OF ADMISSION:  07/17/2011 DATE OF DISCHARGE:  07/22/2011                              DISCHARGE SUMMARY   ADMITTING DIAGNOSES: 47. 60 year old female with hypokalemia secondary to diarrhea and     nausea and vomiting. 2. Dehydration secondary to nausea, vomiting, and diarrhea. 3. Persistent abdominal pain, rule out Crohn disease versus of the     gastroparesis versus combination. 4. Chronic mild iron deficiency anemia. 5. Insulin-dependent diabetes mellitus. 6. Acute renal insufficiency secondary to hypokalemia.  DISCHARGE DIAGNOSES: 1. Persistent complaints of abdominal pain, nausea, vomiting, and     diarrhea with capsule endoscopy suggestive of mild Crohn disease. 2. Diabetic gastroparesis. 3. Hypokalemia, resolved. 4. Renal insufficiency, resolved secondary to dehydration. 5. Delirium medication-induced, resolved. 6. Mild iron deficiency anemia.  CONSULTATIONS:  None.  PROCEDURES:  Endoscopically placed capsule endoscopy.  BRIEF HISTORY:  Heather [name] is a 60 year old female recently known to Dr. Carlean Purl, who has been undergoing outpatient evaluation for what appears to be Crohn disease.  She had random colon biopsies suggestive of inflammatory bowel disease as well as erosions noted at the ileocecal valve on recent colonoscopy.  There was also some question whether this may be NSAID induced.  She had been in and out of the hospital in [place] with nausea, vomiting, and had a gastric emptying study there, which was abnormal.  She had been treated with a trial of Entocort and then IV steroids when she was hospitalized in August in [place], but never really improved with these measures.  At this time, she was scheduled to undergo endoscopically placed capsule  endoscopy.  However, the day prior to her procedure, she had presented to the emergency room and was found to have a potassium of 2.3.  She was given oral potassium, discharged home but continued to have nausea, vomiting and epigastric pain, so when she came back for her procedure on the 18th, her potassium was 2.4 and anesthesia cancelled her case and she is subsequently admitted for medical management.  LABORATORY STUDIES:  On 9/18, WBC of 8.9, hemoglobin of 11, hematocrit of 32.9, MCV of 85, platelets 188.  On the 19th, WBC of 6.9, hemoglobin 9.6, hematocrit of 29.2, platelets 180.  Potassium on admission on the 18th was 2.4, sodium 138, BUN 16, creatinine 1.52.  Followup on the 22nd showed a potassium of 5.2, glucose of 60, BUN 15, creatinine of 1.46.  X-RAY STUDIES:  None during this admission.  HOSPITAL COURSE:  The patient was admitted to the service of Dr. Silvano Rusk.  She was initially placed on IV fluids and given IV potassium to correct her hypokalemia.  She was placed on IV Zofran, IV Dilaudid, IV Reglan all to help control her pain, nausea, and vomiting.  Capsule endoscopy was re-scheduled for 9/20, she was prepped for this and tolerated the procedure without difficulty.  In the interim, she continued to complain of nausea, vomiting and diarrhea, although the patient complaint seem to be in excess of what was witnessed by the  nursing staff.  We were able to advance her diet and she continued to ask for solid food, though she stated that she was vomiting intermittently.  Capsule endoscopy was read and there were subtle findings of small-bowel erosions and 1 aphthous ulcer, which could be consistent with mild Crohn disease.  Decision was made to treat her with Entocort 9 mg daily.  She was also started on amitriptyline 25 mg at bedtime and plans were made for discharge.  On 9/21, she had some issues with confusion, which was felt to be a combination of medication  effects and anesthesia for her procedure the day previous.  We stopped her narcotics.  Stopped Phenergan IV and her delirium cleared.  By September 23, she had only had 1 bowel movement in the prior 24 hours and had vomited once and was felt to have reached maximal hospital benefit as she was otherwise quite stable.  She was allowed discharge to home with plans to follow up with Dr. Carlean Purl on October 4.  She was to stay on Entocort 9 mg p.o. daily, Percocet 5/325 q.6 h. p.r.n. for pain, Phenergan 12.5 mg 1/2 hour a.c., Lomotil one each morning as needed for diarrhea, amitriptyline 25 mg q.h.s., Lantus 60 units q.a.m. and 40 units q.p.m. and her other medications were the same as on admission including Diovan 320 mg b.i.d., vitamin D 1000 units daily, Celexa 20 mg daily, vitamin B12 injection monthly at 1000 mcg, Nexium 40 mg daily, Estrace 0.5 p.o. daily, Lasix 40 mg daily, Amaryl 4 mg b.i.d., and Zocor 20 mg daily.  CONDITION ON DISCHARGE:  Stable.     Amy Esterwood, PA-C   ______________________________ Gatha Mayer, MD,FACG    AE/MEDQ  D:  07/31/2011  T:  07/31/2011  Job:  [id number]  cc:   Gatha Mayer, MD,FACG [facility] [address] [place], [place] [address]  Electronically Signed by Nicoletta Ba PA-C on 08/01/2011 11:29:37 AM Electronically Signed by Silvano Rusk MDFACG on 08/02/2011 10:04:19 AM Electronically Signed by Silvano Rusk MDFACG on 08/02/2011 10:04:40 AM

## 2011-08-02 NOTE — H&P (Signed)
Heather [name], WINTJEN NO.:  [id number]  MEDICAL RECORD NO.:  [id number]  LOCATION:  [id number]                         FACILITY:  [facility]  PHYSICIAN:  Gatha Mayer, MD,FACGDATE OF BIRTH:  09/06/1951  DATE OF ADMISSION:  07/17/2011 DATE OF DISCHARGE:                             HISTORY & PHYSICAL   CHIEF COMPLAINT:  Hypokalemia, there is associated nausea and vomiting and diarrhea, which are chronic with a question of Crohn disease, possible gastroparesis in the setting of type 2 diabetes mellitus.  HISTORY:  This 60 year old white woman is under my care and evaluation for what looks to be Crohn disease.  She has had changes of IBB on random colon biopsies as well as erosions at the ileocecal valve.  There was some question of whether this could be from NSAIDs as well.  She has been in and out of the hospital when in [place], with nausea and vomiting. A gastric emptying study there was abnormal, although she was on promethazine back in July when this happened.  She has been treated with Entocort and then IV steroids when she was hospitalized in August up in [place], but she really never improved, particularly with respect to the and nausea and vomiting.  Again, she might have improved, but did not resolve.  In trying to understand exactly what her disease process was, she is to have a capsule endoscopy of the small intestine, but I would go into deliver that with an endoscope.  She presented to the emergency department at [facility] on July 15, 2011, with potassium at 2.3 and was given potassium chloride 60 mEq p.o.  She spent the next 24 hours having recurrent nausea and vomiting, is also having epigastric pain.  She hurts whether she vomits or not, but the vomiting certainly exacerbates the epigastric pain.  She continues with loose stools.  She had a potassium of 2.4 on recheck today and Anesthesia cancelled her case appropriately.  OTHER PROBLEMS:  Include: 1.  Chronic insomnia. 2. Anxiety and depression. 3. Diabetes mellitus.  She is not complaining of any other problems on complete review of systems otherwise at this time.  PAST MEDICAL HISTORY: 1. Chronic mild renal insufficiency. 2. Diabetes mellitus. 3. Hypertension. 4. Headaches. 5. IBS. 6. Possible versus probable Crohn disease. 7. Palpitations and depression and anxiety. 8. History of cellulitis. 9. Vitamin B12 deficiency. 10.Dyslipidemia. 11.Bronchitis. 12.GERD. 13.Diabetic neuropathy with type 2 diabetes mellitus. 14.Gout. 15.Folliculitis 16.Insomnia. 17.C-spine degenerative joint disease, particularly  C4-C5.  PAST SURGICAL HISTORY: 1. C-spine surgery, June 2008. 2. Total abdominal hysterectomy and cholecystectomy. 3. Right knee arthroscopy 4. Normal EGD, May 29, 2011. 5. Colonoscopy in June at [facility], and in May 10, 2011,     here by me, has demonstrated inflammatory changes at the ileocecal     valve.  The pathology here has raised suggestion of Crohn disease.     Again about a month of steroids, using Entocort mostly did not seem     to help.  SOCIAL HISTORY:  He has never been a smoker or tobacco user.  No alcohol or drug use.  FAMILY HISTORY:  Diabetes in her mother.  DRUG ALLERGIES: 1. AMLODIPINE. 2. CIPRO. 3. METFORMIN. 4.  SULFONAMIDE. 5. LISINOPRIL.  REVIEW OF SYSTEMS:  As above.  MEDICATIONS: 1. Diovan 320 mg b.i.d. 2. Vitamin D 1000 units daily. 3. Celexa 20 mg daily. 4. Vitamin B12 monthly 1000 mcg. 5. Nexium 40 mg daily. 6. Estrace 0.5 mg daily. 7. Furosemide 40 mg daily. 8. Amaryl 4 mg b.i.d. 9. Lantus 60 units morning and 40 units in the evening. 10.Zocor 20 mg daily. 11.Tizanidine 8 mg nightly, as needed. 12.Phenergan 25 mg p.o. as needed.  PHYSICAL EXAMINATION:  GENERAL:  Revealed a well-developed, well- nourished white woman in no acute distress. VITAL SIGNS:  Height 5 feet 4 inches.  Weight 173 pounds.   Blood pressure 135/65, pulse 88 and regular, respirations 11. EYES:  Anicteric.  Pupils round and reactive to light. MOUTH:  Posterior pharynx free of lesions. NECK:  Supple without thyromegaly or mass. CHEST:  Clear. HEART:  S1 and S2.  No rubs or gallops. ABDOMEN:  Soft.  There is tenderness in the upper abdomen, particularly in the epigastric area, which increases with muscle tension.  There is no organomegaly or mass or obvious hernia.  Bowel sounds are present. There is no succussion splash. EXTREMITIES:  The lower extremities are free of edema. SKIN:  Warm and dry without acute rash.  There are some ecchymoses from venous sticks recently. NEUROLOGIC:  She is alert and oriented x3 and nonfocal.  LABORATORY DATA:  As per the HPI, hemoglobin is 11, potassium is 2.4 today.  Glucose 243, BUN 22, creatinine 1.39, albumin 3.1, it was on July 15, 2011.  Lab data today:  Creatinine 1.52, glucose 162, BUN 16, potassium 2.4, chloride 95, sodium 138, hemoglobin 11.  EKG, July 15, 2011, normal sinus rhythm, normal.  ASSESSMENT: 1. Hypokalemia, undoubtedly from her diarrhea and especially her     nausea and vomiting.  She has dehydration along with, this is     evidenced by the rise in her creatinine and the rise in hemoglobin.     Underlying cause may be Crohn disease or gastroparesis or both,     need to evaluate for these.  Hopefully get the capsule endoscopy     study done at some point, have to replete the potassium.  We will     aggressively treat with Reglan. 2. Abdominal pain, mostly musculoskeletal, from vomiting, we will     treat with heating pads.  She can have IV Dilaudid 1 mg every 3 to     4 hours. 3. Mild anemia.  This is known and chronic.  She has had some iron     deficiency.  We will research the labs and the computer is back up.     Some component of chronic disease likely as well. 4. Diabetes mellitus.  We will reduce her dose and uses a sensitive      sliding scale, Lantus 30 units b.i.d. instead of 60 and 40 at this     point. 5. Renal insufficiency.  We will follow up on the creatinine,     aggressive hydration.  Overall, this has been a challenging case to try to understand where her problems are coming from.  She had a delayed gastric emptying on a gastric emptying study in July, when she was hospitalized at [facility], so I am not sure she has gastroparesis, although with her diabetes and neuropathy, she is certainly set up for that.  She was most likely on Phenergan when she had that study.  Hopefully, we can get the capsule endoscopy done  to look at her small bowel.  She has a history of using indomethacin, but denies using that in the months prior to her colonoscopy.  I am not sure how much if her problems are inflammatory bowel disease, if that is the case or if she has a functional GI disorder with nonspecific erosions.  I favor that she has inflammatory bowel disease.  Further plans pending evaluation and clinical course.     Gatha Mayer, MD,FACG     CEG/MEDQ  D:  07/17/2011  T:  07/17/2011  Job:  [id number]  cc:   [name], MD Fax: [phone]  Electronically Signed by Silvano Rusk MDFACG on 08/02/2011 10:04:33 AM

## 2011-08-22 ENCOUNTER — Ambulatory Visit: Payer: Medicare Other | Admitting: Internal Medicine

## 2011-10-01 ENCOUNTER — Other Ambulatory Visit: Payer: Self-pay | Admitting: Internal Medicine

## 2012-05-13 DIAGNOSIS — R1013 Epigastric pain: Secondary | ICD-10-CM | POA: Insufficient documentation

## 2013-04-13 ENCOUNTER — Ambulatory Visit: Payer: Medicare Other | Admitting: Cardiology

## 2013-05-18 ENCOUNTER — Ambulatory Visit: Payer: Medicare Other | Admitting: Cardiovascular Disease

## 2013-05-18 ENCOUNTER — Other Ambulatory Visit: Payer: Self-pay | Admitting: *Deleted

## 2013-05-18 ENCOUNTER — Encounter: Payer: Self-pay | Admitting: *Deleted

## 2013-05-18 ENCOUNTER — Ambulatory Visit (INDEPENDENT_AMBULATORY_CARE_PROVIDER_SITE_OTHER): Payer: Medicare Other | Admitting: Cardiovascular Disease

## 2013-05-18 ENCOUNTER — Telehealth: Payer: Self-pay | Admitting: Cardiovascular Disease

## 2013-05-18 ENCOUNTER — Encounter: Payer: Self-pay | Admitting: Cardiovascular Disease

## 2013-05-18 VITALS — BP 153/84 | HR 72 | Ht 62.5 in | Wt 169.0 lb

## 2013-05-18 DIAGNOSIS — I10 Essential (primary) hypertension: Secondary | ICD-10-CM

## 2013-05-18 DIAGNOSIS — R0602 Shortness of breath: Secondary | ICD-10-CM

## 2013-05-18 DIAGNOSIS — R9431 Abnormal electrocardiogram [ECG] [EKG]: Secondary | ICD-10-CM

## 2013-05-18 DIAGNOSIS — R079 Chest pain, unspecified: Secondary | ICD-10-CM

## 2013-05-18 DIAGNOSIS — Z01810 Encounter for preprocedural cardiovascular examination: Secondary | ICD-10-CM

## 2013-05-18 DIAGNOSIS — R002 Palpitations: Secondary | ICD-10-CM

## 2013-05-18 NOTE — Telephone Encounter (Signed)
°  lexiscan myoview Weight 169 set for 06-01-2013 [facility] checking percert

## 2013-05-18 NOTE — Patient Instructions (Addendum)
Your physician has requested that you have a lexiscan myoview. For further information please visit [email or web address]. Please follow instruction sheet, as given.  Your physician has requested that you have an echocardiogram. Echocardiography is a painless test that uses sound waves to create images of your heart. It provides your doctor with information about the size and shape of your heart and how well your heart's chambers and valves are working. This procedure takes approximately one hour. There are no restrictions for this procedure.  Your physician recommends that you schedule a follow-up appointment in: 4-6 [place] physician recommends that you continue on your current medications as directed. Please refer to the Current Medication list given to you today.

## 2013-05-18 NOTE — Progress Notes (Signed)
Patient ID: ZAELEE BIDDIX, female   DOB: [birth year], 62 y.o.   MRN: [id number]    SUBJECTIVE: Ms. Napolitan is a 62 year old female with a prior history of normal coronaries by cath in 2005, chronic chest pain, hypertension, dyslipidemia, and diabetes. She had a negative CT for pulmonary  embolus and nuclear stress test that was normal in 2010. She also has anemia, GERD, hiatal hernia, and CKD.  A 2-D echocardiogram on December 14, 2010, demonstrated moderate LVH. EF was 60%. Wall motion was normal with no wall motion abnormalities. PA pressure was 35 mmHg.  She does get short of breath with some exertion. It hurts when she takes a deep breath, over the past 3 weeks. She denies a h/o asthma and has never been a smoker.   She's awoken feeling very sweaty. She also has palpitations. She also c/o dizziness. She feels like there are times when her heartrate is fast, then stops, then resumes a normal rhythm.  Her right knee surgery was in 2008 and she reportedly had no complications. She's scheduled for left knee surgery tomorrow.    BP: 153/84  Pulse: 72   PHYSICAL EXAM General: NAD Neck: No JVD, no thyromegaly or thyroid nodule.  Lungs: Clear to auscultation bilaterally with normal respiratory effort. CV: Nondisplaced PMI.  Heart regular S1/S2, no S3/S4, no murmur.  No peripheral edema.  No carotid bruit.  Normal pedal pulses.  Abdomen: Soft, nontender, no hepatosplenomegaly, no distention.  Neurologic: Alert and oriented x 3.  Psych: Normal affect. Extremities: No clubbing or cyanosis.     LABS: Basic Metabolic Panel: No results found for this basename: NA, K, CL, CO2, GLUCOSE, BUN, CREATININE, CALCIUM, MG, PHOS,  in the last 72 hours Liver Function Tests: No results found for this basename: AST, ALT, ALKPHOS, BILITOT, PROT, ALBUMIN,  in the last 72 hours No results found for this basename: LIPASE, AMYLASE,  in the last 72 hours CBC: No results found for this basename: WBC,  NEUTROABS, HGB, HCT, MCV, PLT,  in the last 72 hours Cardiac Enzymes: No results found for this basename: CKTOTAL, CKMB, CKMBINDEX, TROPONINI,  in the last 72 hours BNP: No components found with this basename: POCBNP,  D-Dimer: No results found for this basename: DDIMER,  in the last 72 hours Hemoglobin A1C: No results found for this basename: HGBA1C,  in the last 72 hours Fasting Lipid Panel: No results found for this basename: CHOL, HDL, LDLCALC, TRIG, CHOLHDL, LDLDIRECT,  in the last 72 hours Thyroid Function Tests: No results found for this basename: TSH, T4TOTAL, FREET3, T3FREE, THYROIDAB,  in the last 72 hours Anemia Panel: No results found for this basename: VITAMINB12, FOLATE, FERRITIN, TIBC, IRON, RETICCTPCT,  in the last 72 hours  ECG: NSR, 72 bpm  ASSESSMENT AND PLAN: 1. Chest pain and shortness of breath: I recommend proceeding with a Lexiscan Cardiolite stress test and an echocardiogram, given the fact that she has multiple risk factors for CAD. If these are unrevealing, I would recommend PFT's. 2. Palpitations: these are seldom, so I will hold off on Holter monitoring at this time. 3. Preoperative risk stratification: I asked Mrs. Dacy about postponing her knee surgery until she gets a stress test and echocardiogram, but she is keen on having her surgery tomorrow as she's been experiencing a lot of pain. She is on a beta blocker which will help in the perioperative period. 4. HTN: uncontrolled today, but she tells me it is fairly labile. Will recheck at next office visit  and adjust meds accordingly.   Kate Sable, M.D., F.A.C.C.

## 2013-05-19 NOTE — Telephone Encounter (Signed)
Auth # [id number] exp 06/17/13

## 2013-05-28 ENCOUNTER — Other Ambulatory Visit (INDEPENDENT_AMBULATORY_CARE_PROVIDER_SITE_OTHER): Payer: Medicare Other

## 2013-05-28 ENCOUNTER — Other Ambulatory Visit: Payer: Self-pay

## 2013-05-28 DIAGNOSIS — R0602 Shortness of breath: Secondary | ICD-10-CM

## 2013-05-28 DIAGNOSIS — R079 Chest pain, unspecified: Secondary | ICD-10-CM

## 2013-06-01 DIAGNOSIS — R079 Chest pain, unspecified: Secondary | ICD-10-CM

## 2013-06-08 ENCOUNTER — Encounter: Payer: Self-pay | Admitting: *Deleted

## 2013-06-15 ENCOUNTER — Ambulatory Visit: Payer: Medicare Other | Admitting: Cardiovascular Disease

## 2013-06-24 ENCOUNTER — Ambulatory Visit: Payer: Medicare Other | Admitting: Cardiovascular Disease

## 2013-07-27 ENCOUNTER — Ambulatory Visit: Payer: Medicare Other | Admitting: Cardiovascular Disease

## 2013-08-14 ENCOUNTER — Ambulatory Visit: Payer: Medicare Other | Admitting: Cardiovascular Disease

## 2013-08-26 DIAGNOSIS — M4802 Spinal stenosis, cervical region: Secondary | ICD-10-CM | POA: Insufficient documentation

## 2013-12-23 ENCOUNTER — Encounter: Payer: Self-pay | Admitting: Cardiovascular Disease

## 2014-08-20 ENCOUNTER — Encounter: Payer: Medicare Other | Attending: "Endocrinology | Admitting: Nutrition

## 2014-08-20 VITALS — Ht 63.0 in | Wt 165.0 lb

## 2014-08-20 DIAGNOSIS — Z794 Long term (current) use of insulin: Secondary | ICD-10-CM | POA: Diagnosis not present

## 2014-08-20 DIAGNOSIS — E118 Type 2 diabetes mellitus with unspecified complications: Secondary | ICD-10-CM | POA: Diagnosis present

## 2014-08-20 DIAGNOSIS — IMO0002 Reserved for concepts with insufficient information to code with codable children: Secondary | ICD-10-CM

## 2014-08-20 DIAGNOSIS — E1165 Type 2 diabetes mellitus with hyperglycemia: Secondary | ICD-10-CM

## 2014-08-20 DIAGNOSIS — N184 Chronic kidney disease, stage 4 (severe): Secondary | ICD-10-CM | POA: Insufficient documentation

## 2014-08-20 DIAGNOSIS — Z713 Dietary counseling and surveillance: Secondary | ICD-10-CM | POA: Diagnosis not present

## 2014-08-20 NOTE — Progress Notes (Signed)
  Medical Nutrition Therapy:  Appt start time: 1500 end time:  [id number].   Assessment:  Primary concerns today: Diabetes. Living with husband. Shops and does cooking. Steamed foods. Eating 2-3 meals per day. Has seen Dr. Lowanda Foster for CKD. Going to the GYM once a week. Takes 80 units of Levemir at HS and 20 units of Humalog with meals as of todays visit. Has chronic Pancreatitis she notes and has difficulty with meals and chronic nausea at times.  Preferred Learning Style:   Auditory  Visual  Hands on  Learning Readiness:    Contemplating  MEDICATIONS:    DIETARY INTAKE:  24-hr recall:  B ( AM):   2 slices Bacon and eggs 1-2, toast 1 slice, coffee and water Snk ( AM): none  L ( PM): skips most of the time;  Cheese sandwich or pimento cheese,  Milk 8 oz whole milk,  Snk ( PM): none D ( PM): Meatloaf 2-3 oz, 1/2 baked potato, 8 oz milk Snk ( PM): none Beverages: water and whole milk,  Usual physical activity: gym once a week  Estimated energy needs: 1500 calories 170 g carbohydrates 112 g protein 42 g fat  Progress Towards Goal(s):  In progress.   Nutritional Diagnosis:  NB-1.1 Food and nutrition-related knowledge deficit As related to Diabetes.  As evidenced by A1C 10%.    Intervention:  Nutrition counseling and DM education.  Plan:  Aim for 2-3 Carb Choices per meal (30-45 grams) +/- 1 either way  No snacks between meals unless having a low blood sugars. Try to eat on a schedule of Breakfast 7-8 am, Lunch 12-1 pm and dinner 5-6 pm. Include protein in moderation with your meals and snacks Consider reading food labels for Total Carbohydrate Consider  increasing your activity level by 30 minutes daily as tolerated Check blood sugar 4 times per day and record on log sheet.  Keep a food journal and bring at next visit. Be sure to eat three meals a day so you can take your Humalog with meals to help improve blood sugars thoroughtout the day. Talk to Nephrologist and see if  he wants you on a potassium or other renal diet restrictions. Goal; Eat three balanced meals per day. 2. Take medications as prescribed. 3. Get A1C down to 7% in 3-6 months.  Teaching Method Utilized:  Visual Auditory Hands on  Handouts given during visit include: The Plate Method Carb Counting and Food Label handouts Meal Plan Card  Barriers to learning/adherence to lifestyle change: back problems; going through disc surgery soon.  Demonstrated degree of understanding via:  Teach Back   Monitoring/Evaluation:  Dietary intake, exercise, meal planning.SBG and body weight in 1 month(s).

## 2014-08-25 DIAGNOSIS — N184 Chronic kidney disease, stage 4 (severe): Secondary | ICD-10-CM | POA: Insufficient documentation

## 2014-08-25 DIAGNOSIS — N183 Chronic kidney disease, stage 3 unspecified: Secondary | ICD-10-CM | POA: Insufficient documentation

## 2014-08-25 NOTE — Patient Instructions (Signed)
Plan:  Aim for 2-3 Carb Choices per meal (30-45 grams) +/- 1 either way  No snacks between meals unless having a low blood sugars. Try to eat on a schedule of Breakfast 7-8 am, Lunch 12-1 pm and dinner 5-6 pm. Include protein in moderation with your meals and snacks Consider reading food labels for Total Carbohydrate  Consider  increasing your activity level by 30 minutes daily as tolerated Check blood sugar 4 times per day and record on log sheet.  Keep a food journal and bring at next visit. Be sure to eat three meals a day so you can take your Humalog with meals to help improve blood sugars thoroughtout the day. Talk to Nephrologist and see if he wants you on a potassium or other renal diet restrictions. Goal; Eat three balanced meals per day. 2. Take medications as prescribed. 3. Get A1C down to 7% in 3-6 months.

## 2014-09-17 ENCOUNTER — Ambulatory Visit: Payer: Medicare Other | Admitting: Nutrition

## 2014-10-20 DIAGNOSIS — Z961 Presence of intraocular lens: Secondary | ICD-10-CM | POA: Insufficient documentation

## 2014-11-01 DIAGNOSIS — M4323 Fusion of spine, cervicothoracic region: Secondary | ICD-10-CM | POA: Insufficient documentation

## 2014-12-22 ENCOUNTER — Emergency Department (HOSPITAL_COMMUNITY): Payer: Medicare Other

## 2014-12-22 ENCOUNTER — Telehealth: Payer: Self-pay | Admitting: *Deleted

## 2014-12-22 ENCOUNTER — Inpatient Hospital Stay (HOSPITAL_COMMUNITY)
Admission: EM | Admit: 2014-12-22 | Discharge: 2014-12-23 | DRG: 313 | Disposition: A | Discharge status: Home / Self Care | Payer: Medicare Other | Attending: Internal Medicine | Admitting: Internal Medicine

## 2014-12-22 ENCOUNTER — Encounter (HOSPITAL_COMMUNITY): Payer: Self-pay | Admitting: *Deleted

## 2014-12-22 DIAGNOSIS — R112 Nausea with vomiting, unspecified: Secondary | ICD-10-CM | POA: Diagnosis present

## 2014-12-22 DIAGNOSIS — E1140 Type 2 diabetes mellitus with diabetic neuropathy, unspecified: Secondary | ICD-10-CM | POA: Diagnosis present

## 2014-12-22 DIAGNOSIS — M1990 Unspecified osteoarthritis, unspecified site: Secondary | ICD-10-CM | POA: Diagnosis present

## 2014-12-22 DIAGNOSIS — E785 Hyperlipidemia, unspecified: Secondary | ICD-10-CM

## 2014-12-22 DIAGNOSIS — I5032 Chronic diastolic (congestive) heart failure: Secondary | ICD-10-CM | POA: Diagnosis present

## 2014-12-22 DIAGNOSIS — N183 Chronic kidney disease, stage 3 unspecified: Secondary | ICD-10-CM | POA: Diagnosis present

## 2014-12-22 DIAGNOSIS — K219 Gastro-esophageal reflux disease without esophagitis: Secondary | ICD-10-CM | POA: Diagnosis present

## 2014-12-22 DIAGNOSIS — R079 Chest pain, unspecified: Secondary | ICD-10-CM | POA: Diagnosis present

## 2014-12-22 DIAGNOSIS — F329 Major depressive disorder, single episode, unspecified: Secondary | ICD-10-CM

## 2014-12-22 DIAGNOSIS — K449 Diaphragmatic hernia without obstruction or gangrene: Secondary | ICD-10-CM

## 2014-12-22 DIAGNOSIS — K589 Irritable bowel syndrome without diarrhea: Secondary | ICD-10-CM | POA: Diagnosis present

## 2014-12-22 DIAGNOSIS — E119 Type 2 diabetes mellitus without complications: Secondary | ICD-10-CM

## 2014-12-22 DIAGNOSIS — G8929 Other chronic pain: Secondary | ICD-10-CM | POA: Diagnosis present

## 2014-12-22 DIAGNOSIS — R0789 Other chest pain: Secondary | ICD-10-CM | POA: Diagnosis present

## 2014-12-22 DIAGNOSIS — K210 Gastro-esophageal reflux disease with esophagitis: Secondary | ICD-10-CM

## 2014-12-22 DIAGNOSIS — Z79899 Other long term (current) drug therapy: Secondary | ICD-10-CM | POA: Diagnosis not present

## 2014-12-22 DIAGNOSIS — M109 Gout, unspecified: Secondary | ICD-10-CM | POA: Diagnosis present

## 2014-12-22 DIAGNOSIS — E782 Mixed hyperlipidemia: Secondary | ICD-10-CM | POA: Diagnosis present

## 2014-12-22 DIAGNOSIS — F32A Depression, unspecified: Secondary | ICD-10-CM | POA: Diagnosis present

## 2014-12-22 DIAGNOSIS — I129 Hypertensive chronic kidney disease with stage 1 through stage 4 chronic kidney disease, or unspecified chronic kidney disease: Secondary | ICD-10-CM | POA: Diagnosis present

## 2014-12-22 DIAGNOSIS — E1122 Type 2 diabetes mellitus with diabetic chronic kidney disease: Secondary | ICD-10-CM

## 2014-12-22 DIAGNOSIS — Z794 Long term (current) use of insulin: Secondary | ICD-10-CM

## 2014-12-22 DIAGNOSIS — G4700 Insomnia, unspecified: Secondary | ICD-10-CM | POA: Diagnosis present

## 2014-12-22 DIAGNOSIS — Z981 Arthrodesis status: Secondary | ICD-10-CM | POA: Diagnosis not present

## 2014-12-22 DIAGNOSIS — E1165 Type 2 diabetes mellitus with hyperglycemia: Secondary | ICD-10-CM

## 2014-12-22 DIAGNOSIS — I10 Essential (primary) hypertension: Secondary | ICD-10-CM

## 2014-12-22 DIAGNOSIS — E1121 Type 2 diabetes mellitus with diabetic nephropathy: Secondary | ICD-10-CM

## 2014-12-22 DIAGNOSIS — N184 Chronic kidney disease, stage 4 (severe): Secondary | ICD-10-CM | POA: Diagnosis present

## 2014-12-22 HISTORY — DX: Nausea with vomiting, unspecified: R11.2

## 2014-12-22 HISTORY — DX: Diaphragmatic hernia without obstruction or gangrene: K44.9

## 2014-12-22 LAB — CBC WITH DIFFERENTIAL/PLATELET
Basophils Absolute: 0.1 10*3/uL (ref 0.0–0.1)
Basophils Relative: 1 % (ref 0–1)
Eosinophils Absolute: 0.2 10*3/uL (ref 0.0–0.7)
Eosinophils Relative: 2 % (ref 0–5)
HCT: 35.7 % — ABNORMAL LOW (ref 36.0–46.0)
Hemoglobin: 11.8 g/dL — ABNORMAL LOW (ref 12.0–15.0)
Lymphocytes Relative: 26 % (ref 12–46)
Lymphs Abs: 2.1 10*3/uL (ref 0.7–4.0)
MCH: 28.6 pg (ref 26.0–34.0)
MCHC: 33.1 g/dL (ref 30.0–36.0)
MCV: 86.7 fL (ref 78.0–100.0)
Monocytes Absolute: 0.4 10*3/uL (ref 0.1–1.0)
Monocytes Relative: 5 % (ref 3–12)
Neutro Abs: 5.4 10*3/uL (ref 1.7–7.7)
Neutrophils Relative %: 66 % (ref 43–77)
Platelets: 303 10*3/uL (ref 150–400)
RBC: 4.12 MIL/uL (ref 3.87–5.11)
RDW: 14.5 % (ref 11.5–15.5)
WBC: 8.1 10*3/uL (ref 4.0–10.5)

## 2014-12-22 LAB — TROPONIN I
Troponin I: <0.03 ng/mL (ref <0.031)
Troponin I: <0.03 ng/mL (ref <0.031)
Troponin I: <0.03 ng/mL (ref <0.031)

## 2014-12-22 LAB — BASIC METABOLIC PANEL
Anion gap: 4 — ABNORMAL LOW (ref 5–15)
BUN: 20 mg/dL (ref 6–23)
CO2: 23 mmol/L (ref 19–32)
Calcium: 9.6 mg/dL (ref 8.4–10.5)
Chloride: 107 mmol/L (ref 96–112)
Creatinine, Ser: 1.42 mg/dL — ABNORMAL HIGH (ref 0.50–1.10)
GFR calc Af Amer: 45 mL/min — ABNORMAL LOW (ref >90)
GFR calc non Af Amer: 39 mL/min — ABNORMAL LOW (ref >90)
Glucose, Bld: 255 mg/dL — ABNORMAL HIGH (ref 70–99)
Potassium: 3.9 mmol/L (ref 3.5–5.1)
Sodium: 134 mmol/L — ABNORMAL LOW (ref 135–145)

## 2014-12-22 LAB — PROTIME-INR
INR: 1 (ref 0.00–1.49)
Prothrombin Time: 13.3 seconds (ref 11.6–15.2)

## 2014-12-22 LAB — GLUCOSE, CAPILLARY: Glucose-Capillary: 214 mg/dL — ABNORMAL HIGH (ref 70–99)

## 2014-12-22 LAB — HEPARIN LEVEL (UNFRACTIONATED): Heparin Unfractionated: 0.35 IU/mL (ref 0.30–0.70)

## 2014-12-22 MED ORDER — ONDANSETRON HCL 4 MG/2ML IJ SOLN
4.0000 mg | Freq: Four times a day (QID) | INTRAMUSCULAR | Status: DC | PRN
  Administered 2014-12-22 – 2014-12-23 (×2): 4 mg via INTRAVENOUS
  Filled 2014-12-22 (×2): qty 2

## 2014-12-22 MED ORDER — POTASSIUM CHLORIDE CRYS ER 20 MEQ PO TBCR
20.0000 meq | EXTENDED_RELEASE_TABLET | Freq: Two times a day (BID) | ORAL | Status: DC
  Administered 2014-12-22 – 2014-12-23 (×2): 20 meq via ORAL
  Filled 2014-12-22 (×2): qty 1

## 2014-12-22 MED ORDER — ALPRAZOLAM 0.25 MG PO TABS
0.2500 mg | ORAL_TABLET | Freq: Two times a day (BID) | ORAL | Status: DC | PRN
  Administered 2014-12-23: 0.25 mg via ORAL
  Filled 2014-12-22: qty 1

## 2014-12-22 MED ORDER — ACETAMINOPHEN 325 MG PO TABS: 650.0000 mg | ORAL_TABLET | ORAL | Status: DC | PRN

## 2014-12-22 MED ORDER — ONDANSETRON HCL 4 MG/2ML IJ SOLN
4.0000 mg | Freq: Once | INTRAMUSCULAR | Status: AC
  Administered 2014-12-22: 4 mg via INTRAVENOUS

## 2014-12-22 MED ORDER — EZETIMIBE-SIMVASTATIN 10-20 MG PO TABS
1.0000 | ORAL_TABLET | Freq: Every day | ORAL | Status: DC
  Filled 2014-12-22 (×2): qty 1

## 2014-12-22 MED ORDER — MORPHINE SULFATE 4 MG/ML IJ SOLN
4.0000 mg | Freq: Once | INTRAMUSCULAR | Status: AC
  Administered 2014-12-22: 4 mg via INTRAVENOUS

## 2014-12-22 MED ORDER — HEPARIN (PORCINE) IN NACL 100-0.45 UNIT/ML-% IJ SOLN
900.0000 [IU]/h | INTRAMUSCULAR | Status: DC
  Administered 2014-12-22: 900 [IU]/h via INTRAVENOUS
  Filled 2014-12-22 (×2): qty 250

## 2014-12-22 MED ORDER — INSULIN ASPART 100 UNIT/ML ~~LOC~~ SOLN
0.0000 [IU] | Freq: Every day | SUBCUTANEOUS | Status: DC
  Administered 2014-12-22: 2 [IU] via SUBCUTANEOUS

## 2014-12-22 MED ORDER — LOSARTAN POTASSIUM 50 MG PO TABS
25.0000 mg | ORAL_TABLET | Freq: Every day | ORAL | Status: DC
  Administered 2014-12-23: 25 mg via ORAL
  Filled 2014-12-22 (×3): qty 1

## 2014-12-22 MED ORDER — ALLOPURINOL 100 MG PO TABS
100.0000 mg | ORAL_TABLET | Freq: Every day | ORAL | Status: DC
  Administered 2014-12-23: 100 mg via ORAL
  Filled 2014-12-22 (×3): qty 1

## 2014-12-22 MED ORDER — FUROSEMIDE 40 MG PO TABS
40.0000 mg | ORAL_TABLET | Freq: Every day | ORAL | Status: DC
  Administered 2014-12-23: 40 mg via ORAL
  Filled 2014-12-22: qty 1

## 2014-12-22 MED ORDER — INSULIN DETEMIR 100 UNIT/ML ~~LOC~~ SOLN
45.0000 [IU] | Freq: Every day | SUBCUTANEOUS | Status: DC
  Administered 2014-12-23: 45 [IU] via SUBCUTANEOUS
  Filled 2014-12-22 (×2): qty 0.45

## 2014-12-22 MED ORDER — PANTOPRAZOLE SODIUM 40 MG PO TBEC
80.0000 mg | DELAYED_RELEASE_TABLET | Freq: Every day | ORAL | Status: DC
  Administered 2014-12-23: 80 mg via ORAL
  Filled 2014-12-22: qty 2

## 2014-12-22 MED ORDER — METOPROLOL TARTRATE 50 MG PO TABS
100.0000 mg | ORAL_TABLET | Freq: Two times a day (BID) | ORAL | Status: DC
  Administered 2014-12-22 – 2014-12-23 (×2): 100 mg via ORAL
  Filled 2014-12-22 (×2): qty 2

## 2014-12-22 MED ORDER — ONDANSETRON HCL 4 MG/2ML IJ SOLN
4.0000 mg | Freq: Once | INTRAMUSCULAR | Status: AC
  Administered 2014-12-22: 4 mg via INTRAVENOUS
  Filled 2014-12-22: qty 2

## 2014-12-22 MED ORDER — NITROGLYCERIN IN D5W 200-5 MCG/ML-% IV SOLN
5.0000 ug/min | Freq: Once | INTRAVENOUS | Status: AC
Start: 2014-12-22 — End: 2014-12-22
  Administered 2014-12-22: 5 ug/min via INTRAVENOUS
  Filled 2014-12-22: qty 250

## 2014-12-22 MED ORDER — METOPROLOL TARTRATE 1 MG/ML IV SOLN
5.0000 mg | Freq: Once | INTRAVENOUS | Status: AC
  Administered 2014-12-22: 5 mg via INTRAVENOUS
  Filled 2014-12-22: qty 5

## 2014-12-22 MED ORDER — GI COCKTAIL ~~LOC~~: 30.0000 mL | Freq: Four times a day (QID) | ORAL | Status: DC | PRN

## 2014-12-22 MED ORDER — ONDANSETRON HCL 4 MG/2ML IJ SOLN
INTRAMUSCULAR | Status: AC
  Filled 2014-12-22: qty 2

## 2014-12-22 MED ORDER — ONDANSETRON HCL 4 MG PO TABS
4.0000 mg | ORAL_TABLET | Freq: Four times a day (QID) | ORAL | Status: DC | PRN
  Administered 2014-12-23: 4 mg via ORAL
  Filled 2014-12-22: qty 1

## 2014-12-22 MED ORDER — ONDANSETRON HCL 4 MG/2ML IJ SOLN
4.0000 mg | Freq: Four times a day (QID) | INTRAMUSCULAR | Status: DC | PRN
  Filled 2014-12-22: qty 2

## 2014-12-22 MED ORDER — MORPHINE SULFATE 4 MG/ML IJ SOLN
4.0000 mg | INTRAMUSCULAR | Status: DC | PRN
  Administered 2014-12-22 – 2014-12-23 (×3): 4 mg via INTRAVENOUS
  Filled 2014-12-22 (×3): qty 1

## 2014-12-22 MED ORDER — CITALOPRAM HYDROBROMIDE 20 MG PO TABS
20.0000 mg | ORAL_TABLET | Freq: Every day | ORAL | Status: DC
  Administered 2014-12-23: 20 mg via ORAL
  Filled 2014-12-22 (×3): qty 1

## 2014-12-22 MED ORDER — INSULIN ASPART 100 UNIT/ML ~~LOC~~ SOLN
0.0000 [IU] | Freq: Three times a day (TID) | SUBCUTANEOUS | Status: DC
  Administered 2014-12-23: 2 [IU] via SUBCUTANEOUS
  Administered 2014-12-23: 3 [IU] via SUBCUTANEOUS

## 2014-12-22 MED ORDER — POLYETHYLENE GLYCOL 3350 17 G PO PACK: 17.0000 g | PACK | Freq: Every day | ORAL | Status: DC | PRN

## 2014-12-22 MED ORDER — GI COCKTAIL ~~LOC~~
30.0000 mL | Freq: Once | ORAL | Status: AC
  Administered 2014-12-22: 30 mL via ORAL
  Filled 2014-12-22: qty 30

## 2014-12-22 MED ORDER — ASPIRIN 81 MG PO CHEW
324.0000 mg | CHEWABLE_TABLET | Freq: Once | ORAL | Status: AC
  Administered 2014-12-22: 324 mg via ORAL
  Filled 2014-12-22: qty 4

## 2014-12-22 MED ORDER — HEPARIN BOLUS VIA INFUSION
4000.0000 [IU] | Freq: Once | INTRAVENOUS | Status: AC
  Administered 2014-12-22: 4000 [IU] via INTRAVENOUS

## 2014-12-22 MED ORDER — IOHEXOL 350 MG/ML SOLN
80.0000 mL | Freq: Once | INTRAVENOUS | Status: AC | PRN
  Administered 2014-12-22: 80 mL via INTRAVENOUS

## 2014-12-22 MED ORDER — SODIUM CHLORIDE 0.9 % IV SOLN
INTRAVENOUS | Status: DC
  Administered 2014-12-22: 20:00:00 via INTRAVENOUS

## 2014-12-22 MED ORDER — HEPARIN SODIUM (PORCINE) 5000 UNIT/ML IJ SOLN
5000.0000 [IU] | Freq: Three times a day (TID) | INTRAMUSCULAR | Status: DC
  Administered 2014-12-23 (×2): 5000 [IU] via SUBCUTANEOUS
  Filled 2014-12-22 (×2): qty 1

## 2014-12-22 MED ORDER — SODIUM CHLORIDE 0.9 % IV SOLN
INTRAVENOUS | Status: DC
  Administered 2014-12-23: 02:00:00 via INTRAVENOUS

## 2014-12-22 MED ORDER — ONDANSETRON HCL 4 MG/2ML IJ SOLN
4.0000 mg | Freq: Once | INTRAMUSCULAR | Status: DC
  Filled 2014-12-22: qty 2

## 2014-12-22 MED ORDER — MORPHINE SULFATE 4 MG/ML IJ SOLN
INTRAMUSCULAR | Status: AC
  Filled 2014-12-22: qty 1

## 2014-12-22 NOTE — H&P (Addendum)
[facility] History and Physical  Heather [name] [id number] DOB: [birth year] DOA: 12/22/2014  Referring physician: Dr Heather [name] - [facility]  PCP: Heather [name]   Chief Complaint: CP  HPI: Heather [name] is a 64 y.o. female  Chest pain started 24 hours ago. Middle chest with radiation to left shoulder and neck.. Constant. Associated with shortness of breath and palpitations. Chest pain is not relieved with rest. Some worsening with exertion. Intermittent worsening with deep respirations. Denies change with meals or lying down. Patient able to sleep last night through pain. Earlier today patient developed symptoms of diaphoresis, nonbloody nonbilious emesis (4), and feelings of weakness. Patient with history of IBS but denies recent flare or diarrhea.  Patient reports similar episodes a couple of years ago for she was admitted to [facility], received catheterization and was diagnosed with a leaking heart valve.  Followed by cardiology-Heather [name]   Review of Systems:  Constitutional:  No weight loss, night sweats, Fevers  HEENT:  No headaches, Difficulty swallowing,Tooth/dental problems,Sore throat,  No sneezing, itching, ear ache, nasal congestion, post nasal drip,  Cardio-vascular:  Per HPI GI:  Per HPI Resp:   No excess mucus, no productive cough, No non-productive cough, No coughing up of blood.No change in color of mucus.No wheezing.No chest wall deformity  Skin:  no rash or lesions.  GU:  no dysuria, change in color of urine, no urgency or frequency. No flank pain.  Musculoskeletal:   No joint pain or swelling. No decreased range of motion. No back pain.  Psych:  No change in mood or affect. No depression or anxiety. No memory loss.   Past Medical History  Diagnosis Date  . Cellulitis and abscess     abdomen and buttocks  . Renal failure, unspecified   . Unspecified tinnitus     right  . Edema   . Diabetes mellitus   . Essential  hypertension   . Headache(784.0)   . Irritable bowel syndrome   . Disorders of phosphorus metabolism   . Palpitations   . Depression   . Osteoarthrosis, unspecified whether generalized or localized, unspecified site   . B12 deficiency   . Pure hypercholesterolemia   . Bronchitis, not specified as acute or chronic   . Reflux esophagitis   . Diabetic neuropathy   . Gout   . Hypopotassemia   . Folliculitis   . Insomnia   . DJD (degenerative joint disease)     right forminal stenosis C4-5  . Hiatal hernia    Past Surgical History  Procedure Laterality Date  . C-spine surgery      03/2007  . Cardiac catheterization    . Total abdominal hysterectomy    . Cholecystectomy    . Right knee arthroscopic surgery    . Colonoscopy  04/21/2011; 05/29/11    6/12: [facility] - ?AVM at IC valve, inflammatory changes at [facility] valve [facility]); 7/12 - [facility]; IC valve erosions, look chronic and probably Crohn's per path  . Esophagogastroduodenoscopy  05/29/11    normal   Social History:  reports that she has never smoked. She has never used smokeless tobacco. She reports that she does not drink alcohol or use illicit drugs.  Allergies  Allergen Reactions  . Amlodipine Besylate Swelling  . Ciprofloxacin Nausea Only and Swelling  . Hydrocodone-Acetaminophen Itching  . Metformin Other (See Comments)    REACTION: GI UPSET  . Quinolones Swelling  . Sulfamethoxazole Swelling  . Sulfonamide Derivatives   . Valsartan Swelling  .  Lisinopril Swelling and Rash    Family History  Problem Relation Age of Onset  . Diabetes Mother   . Colon cancer Neg Hx      Prior to Admission medications   Medication Sig Start Date End Date Taking? Authorizing Provider  allopurinol (ZYLOPRIM) 100 MG tablet Take 100 mg by mouth daily.   Yes Historical Provider, [name]  cholecalciferol (VITAMIN D) 1000 UNITS tablet Take 1,000 Units by mouth daily.     Yes Historical Provider, [name]  citalopram (CELEXA) 20 MG  tablet Take 20 mg by mouth daily.     Yes Historical Provider, [name]  esomeprazole (NEXIUM) 40 MG capsule Take 40 mg by mouth daily before breakfast.     Yes Historical Provider, [name]  ezetimibe-simvastatin (VYTORIN) 10-20 MG per tablet Take 1 tablet by mouth at bedtime.   Yes Historical Provider, [name]  furosemide (LASIX) 40 MG tablet Take 40 mg by mouth daily.     Yes Historical Provider, [name]  insulin detemir (LEVEMIR FLEXPEN) 100 UNIT/ML injection Inject 90 Units into the skin at bedtime.    Yes Historical Provider, [name]  insulin lispro (HUMALOG KWIKPEN) 100 UNIT/ML SOPN Inject 10-50 Units into the skin 3 (three) times daily with meals.    Yes Historical Provider, [name]  losartan (COZAAR) 25 MG tablet Take 25 mg by mouth daily.   Yes Historical Provider, [name]  metoprolol (LOPRESSOR) 100 MG tablet Take 100 mg by mouth 2 (two) times daily.   Yes Historical Provider, [name]  oxyCODONE-acetaminophen (PERCOCET/ROXICET) 5-325 MG per tablet Take 1 tablet by mouth every 6 (six) hours as needed (pain).  12/06/14  Yes Historical Provider, [name]  potassium chloride SA (K-DUR,KLOR-CON) 20 MEQ tablet Take 20 mEq by mouth 2 (two) times daily.   Yes Historical Provider, [name]  tiZANidine (ZANAFLEX) 4 MG tablet Take 8 mg by mouth at bedtime.    Yes Historical Provider, [name]  cyanocobalamin (,VITAMIN B-12,) 1000 MCG/ML injection Inject 1,000 mcg into the muscle every 30 (thirty) days.      Historical Provider, [name]  indomethacin (INDOCIN) 50 MG capsule Take 50 mg by mouth 3 (three) times daily as needed (pain).     Historical Provider, [name]   Physical Exam: Filed Vitals:   12/22/14 2128 12/22/14 2130 12/22/14 2133 12/22/14 2136  BP: 137/69 143/76 129/70 136/77  Pulse: 81 80 87 83  Temp:      TempSrc:      Resp: 28 26 14 16   Height:      Weight:      SpO2: 97% 100% 100% 100%    Wt Readings from Last 3 Encounters:  12/22/14 73.483 kg (162 lb)  08/20/14 74.844 kg (165 lb)  05/18/13 76.658 kg (169 lb)    General:  Appears calm and  comfortable Eyes:  PERRL, normal lids, irises & conjunctiva ENT:  grossly normal hearing, lips & tongue Neck: Bilat vertical scars w/ L side w/ surounding bruising, nonttp.  no LAD, masses or thyromegaly Cardiovascular:  RRR, no m/r/g. No LE edema. Telemetry:  SR, no arrhythmias  Respiratory:  CTA bilaterally, no w/r/r. Normal respiratory effort. Abdomen:  soft, ntnd Skin:  no rash or induration seen on limited exam Musculoskeletal:  grossly normal tone BUE/BLE Psychiatric:  grossly normal mood and affect, speech fluent and appropriate Neurologic:  grossly non-focal.          Labs on Admission:  Basic Metabolic Panel:  Recent Labs Lab 12/22/14 1653  NA 134*  K 3.9  CL 107  CO2 23  GLUCOSE 255*  BUN 20  CREATININE 1.42*  CALCIUM 9.6   Liver Function Tests: No results for input(s): AST, ALT, ALKPHOS, BILITOT, PROT, ALBUMIN in the last 168 hours. No results for input(s): LIPASE, AMYLASE in the last 168 hours. No results for input(s): AMMONIA in the last 168 hours. CBC:  Recent Labs Lab 12/22/14 1653  WBC 8.1  NEUTROABS 5.4  HGB 11.8*  HCT 35.7*  MCV 86.7  PLT 303   Cardiac Enzymes:  Recent Labs Lab 12/22/14 1653 12/22/14 2047  TROPONINI <0.03 <0.03    BNP (last 3 results) No results for input(s): BNP in the last 8760 hours.  ProBNP (last 3 results) No results for input(s): PROBNP in the last 8760 hours.  CBG: No results for input(s): GLUCAP in the last 168 hours.  Radiological Exams on Admission: Dg Chest 2 View  12/22/2014   CLINICAL DATA:  Chest pain beginning last night with worsening shortness of breath, diaphoresis, nausea, and vomiting. Chest pressure radiates through the left shoulder.  EXAM: CHEST  2 VIEW  COMPARISON:  11/12/2014  FINDINGS: The cardiomediastinal silhouette is within normal limits. The lungs are mildly hyperinflated without evidence of airspace consolidation, edema, pleural effusion, or pneumothorax. There is minimal scarring in  the left lung base. There may be a tiny granuloma or superimposed sclerotic osseous focus/bone island in the right upper lobe. No acute osseous abnormality is identified.  IMPRESSION: No active cardiopulmonary disease.   Electronically Signed   By: Logan Bores   On: 12/22/2014 17:33   Ct Angio Chest Aorta W/cm &/or Wo/cm  12/22/2014   CLINICAL DATA:  Chest pain, shortness of breath, and weakness since last night. Nausea and vomiting, diaphoresis, diabetes.  EXAM: CT ANGIOGRAPHY CHEST WITH CONTRAST  TECHNIQUE: Multidetector CT imaging of the chest was performed using the standard protocol during bolus administration of intravenous contrast. Multiplanar CT image reconstructions and MIPs were obtained to evaluate the vascular anatomy.  CONTRAST:  74mL OMNIPAQUE IOHEXOL 350 MG/ML SOLN. Reduced contrast dose is utilized due to decreased GFR 39.  COMPARISON:  Chest radiograph 12/22/2014. CT angio chest 02/27/2009.  FINDINGS: Noncontrast CT images of the chest demonstrate calcification of the thoracic aorta. No significant Coronary artery calcification. Normal caliber at aorta. No evidence of intramural hematoma. Surgical absence of the gallbladder. Postoperative changes in the cervical spine.  Arterial phase contrast-enhanced images of the chest demonstrate normal caliber thoracic aorta. No evidence of aortic dissection or aneurysm. But great vessel origins are patent. Left vertebral artery arises from the aortic arch. Normal heart size. Central pulmonary arteries are well opacified without evidence of significant pulmonary embolus.  Two nodules demonstrated in the right middle lung, largest measuring 9 mm diameter. These were not present on the previous study. Followup CT in 3 months is suggested. No focal airspace disease or consolidation. Mild dependent atelectasis in the lung bases. No pneumothorax. No pleural effusions.  Included portions of the upper abdominal organs demonstrate diffuse fatty infiltration of the  liver and surgical absence of the gallbladder. Degenerative changes in the spine.  Review of the MIP images confirms the above findings.  IMPRESSION: No evidence of dissection or aneurysm of the thoracic aorta. No significant pulmonary emboli. Indeterminate nodules demonstrated in the right middle lung measuring up to 9 mm diameter. Follow-up CT in 3 months is suggested.   Electronically Signed   By: Lucienne Capers M.D.   On: 12/22/2014 20:23    EKG: Independently reviewed. Sinus, QT 393, no  sign of ACS. No change from previous tracings.   Assessment/Plan Principal Problem:   Chest pain Active Problems:   Irritable bowel syndrome   Chronic kidney disease, stage III (moderate)   Essential hypertension   Nausea & vomiting   Diabetes mellitus   Hiatal hernia   GERD (gastroesophageal reflux disease)   Gout   HLD (hyperlipidemia)   Depression   Chest pain: Etiology unclear but unlikely cardiac. Heather. Wilson [name] discussed case w/ Heather. Radford Pax of cardiology at [facility] and feels pt appropriate to stay at [facility]. GI vs MSK vs pleuritic vs cardiac vs psychosomatic. Pt w/ several risk factors including age, DM, HLD, HTN. Pt w/o any relief from nitro drip or hep drip. Pts only relief has come from Morphine in ED. Nuclear Stress Test at [facility] from 2014 was nml. Cardiac cath from 2012 was nml. EKG w/o sign of ACS. First tropnonin neg. CTA neg for PE or other acute process (79mm nodules recommending f/u imaging in 7mo). No ASA per pt per her cardiologist (Heather [name])  - Admit - tele - GI cocktail - cycle troponins - stop nitro drip and hep drip - EKG in am - +/- cardiology consult in am  Nausea vomiting: likely secondary to chronic (daily) IBS symptoms w/ possible overlying viral gastro. Symptoms started on day of admission w/ nausea, emesis (x4), diaphoresis.  - Zofran PRN - IVF NS 53ml/hr  CKD III: Cr 1.42. Baseline 1.4 - IVF as above - BMET in am  Spinal surgery: recent spinal fusion. Anterior  approach through L side. Well healing surgical scar w/ surounding bruising.  - monitor  Chronic diastolic congestive heart failure: No sign of acute exacerbation. Echo from 2014 showing EF 55-60% and grade 1 diastolic dysfunction - cont lasix  - Continue home potassium  HTN: Elevated on admission. - Continue losartan, metoprolol - Hydralazine when necessary SBP greater than 180  Diabetes: No previous A1c in chart. On insulin at home. - SSI  - continue half home dose of Levemir -A1c  Gout: No sign of flare -Continue allopurinol  Hiatal hernia\GERD:  - Continue Protonix - GI cocktail as above.  HLD:  - Continue vytorin  Depression:  - continue celexa  Code Status: FULL DVT Prophylaxis: Hep Family Communication: Husband Disposition Plan: Pending r/o    MERRELL, DAVID J, [name] [email or web address] Password [email or web address]

## 2014-12-22 NOTE — ED Provider Notes (Signed)
CSN: [id number]     Arrival date & time 12/22/14  1522 History  This chart was scribed for Virgel Manifold, MD by Rayfield Citizen, ED Scribe. This patient was seen in room APA07/APA07 and the patient's care was started at 3:53 PM.    Chief Complaint  Patient presents with  . Chest Pain   Patient is a 64 y.o. female presenting with chest pain. The history is provided by the patient. No language interpreter was used.  Chest Pain Associated symptoms: nausea, shortness of breath, vomiting and weakness   Associated symptoms: no cough and no fever      HPI Comments: Heather [name] is a 64 y.o. female with past medical history of DM, HTN, palpitations, hypercholesterolemia, who presents to the Emergency Department complaining of chest pain. She reports that her symptoms began last night with palpitations and discomfort, which continued throughout the day today with worsening constant SOB, diaphoresis, nausea, and vomiting. She describes her chest pain as a "pressure" that radiates through her left shoulder. She also notes chills, weakness. She denies fever, cough. No treatments or medications tried PTA.   She reports a prior episode of similar symptoms (2013), at which time she had a cardiac cath (at [facility]) and was told she had a "leaking heart valve."   Cardiologist is Dr. Bronson Ing; last seen June 2015. She no longer takes aspirin ("taken off due to my liver.")   Past Medical History  Diagnosis Date  . Cellulitis and abscess     abdomen and buttocks  . Renal failure, unspecified   . Unspecified tinnitus     right  . Edema   . Diabetes mellitus   . Essential hypertension   . Headache(784.0)   . Irritable bowel syndrome   . Disorders of phosphorus metabolism   . Palpitations   . Depression   . Osteoarthrosis, unspecified whether generalized or localized, unspecified site   . B12 deficiency   . Pure hypercholesterolemia   . Bronchitis, not specified as acute or chronic   . Reflux  esophagitis   . Diabetic neuropathy   . Gout   . Hypopotassemia   . Folliculitis   . Insomnia   . DJD (degenerative joint disease)     right forminal stenosis C4-5   Past Surgical History  Procedure Laterality Date  . C-spine surgery      03/2007  . Cardiac catheterization    . Total abdominal hysterectomy    . Cholecystectomy    . Right knee arthroscopic surgery    . Colonoscopy  04/21/2011; 05/29/11    6/12: [facility] - ?AVM at IC valve, inflammatory changes at [facility] valve [facility]); 7/12 - [facility]; IC valve erosions, look chronic and probably Crohn's per path  . Esophagogastroduodenoscopy  05/29/11    normal   Family History  Problem Relation Age of Onset  . Diabetes Mother   . Colon cancer Neg Hx    History  Substance Use Topics  . Smoking status: Never Smoker   . Smokeless tobacco: Never Used  . Alcohol Use: No   OB History    No data available     Review of Systems  Constitutional: Positive for chills. Negative for fever.  Respiratory: Positive for shortness of breath. Negative for cough.   Cardiovascular: Positive for chest pain.  Gastrointestinal: Positive for nausea and vomiting.  Neurological: Positive for weakness and light-headedness.  All other systems reviewed and are negative.  Allergies  Amlodipine besylate; Ciprofloxacin; Metformin; Sulfonamide derivatives; and Lisinopril  Home Medications   Prior to Admission medications   Medication Sig Start Date End Date Taking? Authorizing Provider  allopurinol (ZYLOPRIM) 100 MG tablet Take 100 mg by mouth daily.    Historical Provider, MD  cholecalciferol (VITAMIN D) 1000 UNITS tablet Take 1,000 Units by mouth daily.      Historical Provider, MD  citalopram (CELEXA) 20 MG tablet Take 20 mg by mouth daily.      Historical Provider, MD  cyanocobalamin (,VITAMIN B-12,) 1000 MCG/ML injection Inject 1,000 mcg into the muscle every 30 (thirty) days.      Historical Provider, MD  esomeprazole (NEXIUM) 40 MG  capsule Take 40 mg by mouth daily before breakfast.      Historical Provider, MD  furosemide (LASIX) 40 MG tablet Take 40 mg by mouth daily.      Historical Provider, MD  glimepiride (AMARYL) 4 MG tablet Take 4 mg by mouth. Takes one tablet twice daily    Historical Provider, MD  indomethacin (INDOCIN) 50 MG capsule Take 50 mg by mouth 3 (three) times daily as needed.      Historical Provider, MD  insulin detemir (LEVEMIR FLEXPEN) 100 UNIT/ML injection Inject 70 units every morning and 70 units every evening    Historical Provider, MD  insulin lispro (HUMALOG KWIKPEN) 100 UNIT/ML SOPN Inject 70 Units into the skin 3 (three) times daily with meals.    Historical Provider, MD  metoprolol (TOPROL-XL) 100 MG 24 hr tablet Take 100 mg by mouth 2 (two) times daily.     Historical Provider, MD  potassium chloride SA (K-DUR,KLOR-CON) 20 MEQ tablet Take 20 mEq by mouth 2 (two) times daily.    Historical Provider, MD  simvastatin (ZOCOR) 20 MG tablet Take 20 mg by mouth at bedtime.      Historical Provider, MD  tiZANidine (ZANAFLEX) 4 MG tablet Take 8 mg by mouth at bedtime.     Historical Provider, MD  valsartan (DIOVAN) 320 MG tablet Take 320 mg by mouth daily.      Historical Provider, MD   BP 189/87 mmHg  Pulse 105  Temp(Src) 97.7 F (36.5 C) (Oral)  Resp 16  Ht 5\' 4"  (1.626 m)  Wt 162 lb (73.483 kg)  BMI 27.79 kg/m2  SpO2 97% Physical Exam  Constitutional: She is oriented to person, place, and time. She appears well-developed and well-nourished.  Non-toxic appearance. She does not appear ill. No distress.  Appears uncomfortable; retching   HENT:  Head: Normocephalic and atraumatic.  Right Ear: External ear normal.  Left Ear: External ear normal.  Nose: Nose normal. No mucosal edema or rhinorrhea.  Mouth/Throat: Oropharynx is clear and moist and mucous membranes are normal. No dental abscesses or uvula swelling.  Eyes: Conjunctivae and EOM are normal. Pupils are equal, round, and reactive to  light.  Neck: Normal range of motion and full passive range of motion without pain. Neck supple.  Cardiovascular: Regular rhythm and normal heart sounds.  Tachycardia present.  Exam reveals no gallop and no friction rub.   No murmur heard. Pulmonary/Chest: Effort normal and breath sounds normal. No respiratory distress. She has no wheezes. She has no rhonchi. She has no rales. She exhibits no tenderness and no crepitus.  Abdominal: Soft. Normal appearance and bowel sounds are normal. She exhibits no distension. There is no tenderness. There is no rebound and no guarding.  Musculoskeletal: Normal range of motion. She exhibits no edema or tenderness.  Moves all extremities well.   Neurological: She is alert  and oriented to person, place, and time. She has normal strength. No cranial nerve deficit.  Skin: Skin is warm, dry and intact. No rash noted. No erythema. No pallor.  Psychiatric: She has a normal mood and affect. Her speech is normal and behavior is normal. Her mood appears not anxious.  Nursing note and vitals reviewed.   ED Course  Procedures   DIAGNOSTIC STUDIES: Oxygen Saturation is 97% on RA, normal by my interpretation.    COORDINATION OF CARE: 4:03 PM Discussed treatment plan with pt at bedside and pt agreed to plan.  Labs Review Labs Reviewed  CBC WITH DIFFERENTIAL/PLATELET - Abnormal; Notable for the following:    Hemoglobin 11.8 (*)    HCT 35.7 (*)    All other components within normal limits  BASIC METABOLIC PANEL - Abnormal; Notable for the following:    Sodium 134 (*)    Glucose, Bld 255 (*)    Creatinine, Ser 1.42 (*)    GFR calc non Af Amer 39 (*)    GFR calc Af Amer 45 (*)    Anion gap 4 (*)    All other components within normal limits  CBC - Abnormal; Notable for the following:    RBC 3.23 (*)    Hemoglobin 9.3 (*)    HCT 28.6 (*)    All other components within normal limits  HEMOGLOBIN A1C - Abnormal; Notable for the following:    Hgb A1c MFr Bld 9.0  (*)    All other components within normal limits  GLUCOSE, CAPILLARY - Abnormal; Notable for the following:    Glucose-Capillary 214 (*)    All other components within normal limits  GLUCOSE, CAPILLARY - Abnormal; Notable for the following:    Glucose-Capillary 178 (*)    All other components within normal limits  GLUCOSE, CAPILLARY - Abnormal; Notable for the following:    Glucose-Capillary 163 (*)    All other components within normal limits  GLUCOSE, CAPILLARY - Abnormal; Notable for the following:    Glucose-Capillary 210 (*)    All other components within normal limits  MRSA PCR SCREENING  TROPONIN I  PROTIME-INR  HEPARIN LEVEL (UNFRACTIONATED)  TROPONIN I  TROPONIN I  TROPONIN I  LIPASE, BLOOD    Imaging Review No results found.   Dg Chest 2 View  12/22/2014   CLINICAL DATA:  Chest pain beginning last night with worsening shortness of breath, diaphoresis, nausea, and vomiting. Chest pressure radiates through the left shoulder.  EXAM: CHEST  2 VIEW  COMPARISON:  11/12/2014  FINDINGS: The cardiomediastinal silhouette is within normal limits. The lungs are mildly hyperinflated without evidence of airspace consolidation, edema, pleural effusion, or pneumothorax. There is minimal scarring in the left lung base. There may be a tiny granuloma or superimposed sclerotic osseous focus/bone island in the right upper lobe. No acute osseous abnormality is identified.  IMPRESSION: No active cardiopulmonary disease.   Electronically Signed   By: Logan Bores   On: 12/22/2014 17:33   Ct Angio Chest Aorta W/cm &/or Wo/cm  12/22/2014   CLINICAL DATA:  Chest pain, shortness of breath, and weakness since last night. Nausea and vomiting, diaphoresis, diabetes.  EXAM: CT ANGIOGRAPHY CHEST WITH CONTRAST  TECHNIQUE: Multidetector CT imaging of the chest was performed using the standard protocol during bolus administration of intravenous contrast. Multiplanar CT image reconstructions and MIPs were  obtained to evaluate the vascular anatomy.  CONTRAST:  43mL OMNIPAQUE IOHEXOL 350 MG/ML SOLN. Reduced contrast dose is utilized due to  decreased GFR 39.  COMPARISON:  Chest radiograph 12/22/2014. CT angio chest 02/27/2009.  FINDINGS: Noncontrast CT images of the chest demonstrate calcification of the thoracic aorta. No significant Coronary artery calcification. Normal caliber at aorta. No evidence of intramural hematoma. Surgical absence of the gallbladder. Postoperative changes in the cervical spine.  Arterial phase contrast-enhanced images of the chest demonstrate normal caliber thoracic aorta. No evidence of aortic dissection or aneurysm. But great vessel origins are patent. Left vertebral artery arises from the aortic arch. Normal heart size. Central pulmonary arteries are well opacified without evidence of significant pulmonary embolus.  Two nodules demonstrated in the right middle lung, largest measuring 9 mm diameter. These were not present on the previous study. Followup CT in 3 months is suggested. No focal airspace disease or consolidation. Mild dependent atelectasis in the lung bases. No pneumothorax. No pleural effusions.  Included portions of the upper abdominal organs demonstrate diffuse fatty infiltration of the liver and surgical absence of the gallbladder. Degenerative changes in the spine.  Review of the MIP images confirms the above findings.  IMPRESSION: No evidence of dissection or aneurysm of the thoracic aorta. No significant pulmonary emboli. Indeterminate nodules demonstrated in the right middle lung measuring up to 9 mm diameter. Follow-up CT in 3 months is suggested.   Electronically Signed   By: Lucienne Capers M.D.   On: 12/22/2014 20:23   EKG Interpretation   Date/Time:  Wednesday December 22 2014 15:35:29 EST Ventricular Rate:  109 PR Interval:  141 QRS Duration: 89 QT Interval:  393 QTC Calculation: 529 R Axis:   71 Text Interpretation:  Sinus tachycardia Prolonged QT  interval No  significant change since last tracing Confirmed by Wilson Singer  MD, Kaytlan Behrman  ([id number]) on 12/22/2014 4:20:18 PM      MDM   Final diagnoses:  Chest pain   63yf with CP. Initial concern for unstable angina/ACS and ordered heparin/nitro. Pt says no history of similar pain although, upon later reviewing some records, it seems like she may have actually. Normal coronaries on cath in 2005. Low risk stress in 2014. Will discuss with cardiology.   I personally preformed the services scribed in my presence. The recorded information has been reviewed is accurate. Virgel Manifold, MD.      Virgel Manifold, MD 12/29/14 [phone]

## 2014-12-22 NOTE — ED Notes (Signed)
D/ced per Dr. Baker Janus verbal order.

## 2014-12-22 NOTE — ED Notes (Signed)
Report given to [facility] on Dept 300, all questions answered.

## 2014-12-22 NOTE — ED Notes (Signed)
Pt with mid cp that radiates to left shoulder since last night, got worse with sob, lightheadedness and N/V

## 2014-12-22 NOTE — Progress Notes (Signed)
ANTICOAGULATION CONSULT NOTE - Initial Consult  Pharmacy Consult for Heparin Indication: chest pain/ACS  Allergies  Allergen Reactions  . Amlodipine Besylate Swelling  . Ciprofloxacin Nausea Only and Swelling  . Metformin Other (See Comments)    REACTION: GI UPSET  . Sulfonamide Derivatives   . Lisinopril Swelling and Rash    Patient Measurements: Height: 5\' 4"  (162.6 cm) Weight: 162 lb (73.483 kg) IBW/kg (Calculated) : 54.7 Heparin Dosing Weight: 70 kg  Vital Signs: Temp: 97.7 F (36.5 C) (02/24 1530) Temp Source: Oral (02/24 1530) BP: 189/87 mmHg (02/24 1530) Pulse Rate: 105 (02/24 1542)    Medical History: Past Medical History  Diagnosis Date  . Cellulitis and abscess     abdomen and buttocks  . Renal failure, unspecified   . Unspecified tinnitus     right  . Edema   . Diabetes mellitus   . Essential hypertension   . Headache(784.0)   . Irritable bowel syndrome   . Disorders of phosphorus metabolism   . Palpitations   . Depression   . Osteoarthrosis, unspecified whether generalized or localized, unspecified site   . B12 deficiency   . Pure hypercholesterolemia   . Bronchitis, not specified as acute or chronic   . Reflux esophagitis   . Diabetic neuropathy   . Gout   . Hypopotassemia   . Folliculitis   . Insomnia   . DJD (degenerative joint disease)     right forminal stenosis C4-5    Medications:   (Not in a hospital admission)  Assessment: 64 y.o. female presenting with chest pain. Symptoms began last night with palpitations and discomfort. Patient reports a prior episode of similar symptoms (2013), at which time she had a cardiac cath (at [facility]) and was told she had a "leaking heart valve" Labs reviewed. PTA medications reviewed.  Goal of Therapy:  Heparin level 0.3-0.7 units/ml Monitor platelets by anticoagulation protocol: Yes   Plan:  Give 4000 units bolus x 1 Start heparin infusion at 900 units/hr Check anti-Xa level in 6 hours  and daily while on heparin Continue to monitor H&H and platelets  Abner Greenspan, Gwenn Teodoro Bennett 12/22/2014,5:25 PM

## 2014-12-22 NOTE — Telephone Encounter (Signed)
Patient c/o active severe chest pain rated #10 on a scale of 1-10, (10 being the greatest). Patient does not have nitroglycerin and said she never has. Patient also c/o being nauseated, sob and dizziness. Nurse advised patient to go the nearest ED now. Patient request to be seen in the office by her cardiologist. Nurse advised patient that with the symptoms she is describing that the ED was the appropriate place for her. Patient said that her husband was with her and she would go to [facility].

## 2014-12-23 DIAGNOSIS — R079 Chest pain, unspecified: Secondary | ICD-10-CM | POA: Insufficient documentation

## 2014-12-23 LAB — CBC
HCT: 28.6 % — ABNORMAL LOW (ref 36.0–46.0)
Hemoglobin: 9.3 g/dL — ABNORMAL LOW (ref 12.0–15.0)
MCH: 28.8 pg (ref 26.0–34.0)
MCHC: 32.5 g/dL (ref 30.0–36.0)
MCV: 88.5 fL (ref 78.0–100.0)
Platelets: 258 10*3/uL (ref 150–400)
RBC: 3.23 MIL/uL — ABNORMAL LOW (ref 3.87–5.11)
RDW: 14.9 % (ref 11.5–15.5)
WBC: 6.5 10*3/uL (ref 4.0–10.5)

## 2014-12-23 LAB — GLUCOSE, CAPILLARY
GLUCOSE-CAPILLARY: 210 mg/dL — AB (ref 70–99)
Glucose-Capillary: 178 mg/dL — ABNORMAL HIGH (ref 70–99)
Glucose-Capillary: 163 mg/dL — ABNORMAL HIGH (ref 70–99)

## 2014-12-23 LAB — LIPASE, BLOOD: LIPASE: 33 U/L (ref 11–59)

## 2014-12-23 LAB — TROPONIN I

## 2014-12-23 LAB — MRSA PCR SCREENING: MRSA by PCR: NEGATIVE

## 2014-12-23 MED ORDER — TIZANIDINE HCL 4 MG PO TABS
4.0000 mg | ORAL_TABLET | Freq: Once | ORAL | Status: AC
  Administered 2014-12-23: 4 mg via ORAL
  Filled 2014-12-23: qty 1

## 2014-12-23 MED ORDER — EZETIMIBE 10 MG PO TABS
10.0000 mg | ORAL_TABLET | Freq: Every day | ORAL | Status: DC
  Administered 2014-12-23: 10 mg via ORAL

## 2014-12-23 MED ORDER — SIMVASTATIN 20 MG PO TABS
20.0000 mg | ORAL_TABLET | Freq: Every day | ORAL | Status: DC
  Administered 2014-12-23: 20 mg via ORAL

## 2014-12-23 MED ORDER — OXYCODONE-ACETAMINOPHEN 5-325 MG PO TABS
1.0000 | ORAL_TABLET | Freq: Four times a day (QID) | ORAL | Status: DC | PRN
  Administered 2014-12-23: 2 via ORAL
  Filled 2014-12-23: qty 2

## 2014-12-23 NOTE — Progress Notes (Signed)
UR chart review completed.  

## 2014-12-23 NOTE — Discharge Summary (Signed)
Physician Discharge Summary  Heather [name] [id number] DOB: 03-25-1951 DOA: 12/22/2014  PCP: [name], MD  Admit date: 12/22/2014 Discharge date: 12/23/2014  Time spent: 40 minutes  Recommendations for Outpatient Follow-up:  1. PCP in 1-2 weeks for evaluation of chronic pain  2. Follow up cardiology 01/24/15 as scheduled.    Discharge Diagnoses:  Principal Problem:   Chest pain Active Problems:   Irritable bowel syndrome   Chronic kidney disease, stage III (moderate)   Essential hypertension   Nausea & vomiting   Diabetes mellitus   Hiatal hernia   GERD (gastroesophageal reflux disease)   Gout   HLD (hyperlipidemia)   Depression   Discharge Condition: stable  Diet recommendation: heart healthy  Filed Weights   12/22/14 1530 12/22/14 2234  Weight: 73.483 kg (162 lb) 73.074 kg (161 lb 1.6 oz)    History of present illness:  Heather [name] is a 64 y.o. female who presented to ED on2/24/16 with cc Chest pain. Chest pain started 24 hours prior. Middle chest with radiation to left shoulder and neck.. Constant. Associated with shortness of breath and palpitations. Chest pain  not relieved with rest. Some worsening with exertion. Intermittent worsening with deep respirations. Denied change with meals or lying down. Patient able to sleep  night through pain. Earlier  patient developed symptoms of diaphoresis, nonbloody nonbilious emesis (4), and feelings of weakness. Patient with history of IBS but denied recent flare or diarrhea.  Patient reported similar episodes a couple of years ago for she was admitted to [facility], received catheterization and was diagnosed with a leaking heart valve.  Followed by cardiology-Dr. Marca Ancona Course:  Chest pain: Etiology unclear but unlikely cardiac. Dr. Wilson Singer discussed case w/ Dr. Radford Pax of cardiology at [facility] and feels pt appropriate to stay at [facility].  Hx chronic pain. Evaluated by cardiology who opine atypical  pain with no objective evidence of ischemia by EKG or enzymes. Recommend no further cardiac work up and f/u cards 2 weeks.   Nausea vomiting: likely secondary to chronic (daily) IBS symptoms. Reported emesis after breakfast but tolerated lunch well. At discharge resolved.   CKD III: Cr 1.42. Baseline 1.4. At baseline at discharge.   Spinal surgery: recent spinal fusion. ell healing surgical scar w/ surounding bruising. No numbness/tingling weakness of extremities.   Chronic diastolic congestive heart failure: No sign of acute exacerbation. Echo from 2014 showing EF 55-60% and grade 1 diastolic dysfunction  [id number] control at discharge  Diabetes: A1c pending at discharge. OP follow up   Procedures:  none  Consultations:  cardiology  Discharge Exam: Filed Vitals:   12/23/14 0555  BP: 98/53  Pulse: 65  Temp: 98.2 F (36.8 C)  Resp: 16    General: well nourished appears comfortable Cardiovascular: RRR no MGR  Respiratory: normal effort BS clear bilaterally no wheeze  Discharge Instructions    Current Discharge Medication List    CONTINUE these medications which have NOT CHANGED   Details  allopurinol (ZYLOPRIM) 100 MG tablet Take 100 mg by mouth daily.    cholecalciferol (VITAMIN D) 1000 UNITS tablet Take 1,000 Units by mouth daily.      citalopram (CELEXA) 20 MG tablet Take 20 mg by mouth daily.      esomeprazole (NEXIUM) 40 MG capsule Take 40 mg by mouth daily before breakfast.      ezetimibe-simvastatin (VYTORIN) 10-20 MG per tablet Take 1 tablet by mouth at bedtime.    furosemide (LASIX) 40 MG tablet Take 40 mg  by mouth daily.      insulin detemir (LEVEMIR FLEXPEN) 100 UNIT/ML injection Inject 90 Units into the skin at bedtime.     insulin lispro (HUMALOG KWIKPEN) 100 UNIT/ML SOPN Inject 10-50 Units into the skin 3 (three) times daily with meals.     losartan (COZAAR) 25 MG tablet Take 25 mg by mouth daily.    metoprolol (LOPRESSOR) 100 MG tablet Take  100 mg by mouth 2 (two) times daily.    oxyCODONE-acetaminophen (PERCOCET/ROXICET) 5-325 MG per tablet Take 1 tablet by mouth every 6 (six) hours as needed (pain).     potassium chloride SA (K-DUR,KLOR-CON) 20 MEQ tablet Take 20 mEq by mouth 2 (two) times daily.    tiZANidine (ZANAFLEX) 4 MG tablet Take 8 mg by mouth at bedtime.     cyanocobalamin (,VITAMIN B-12,) 1000 MCG/ML injection Inject 1,000 mcg into the muscle every 30 (thirty) days.      indomethacin (INDOCIN) 50 MG capsule Take 50 mg by mouth 3 (three) times daily as needed (pain).        Allergies  Allergen Reactions  . Amlodipine Besylate Swelling  . Ciprofloxacin Nausea Only and Swelling  . Hydrocodone-Acetaminophen Itching  . Metformin Other (See Comments)    REACTION: GI UPSET  . Quinolones Swelling  . Sulfamethoxazole Swelling  . Sulfonamide Derivatives   . Valsartan Swelling  . Lisinopril Swelling and Rash   Follow-up Information    Follow up with [address], [place]. Schedule an appointment as soon as possible for a visit in 2 weeks.   Specialty:  Internal Medicine   Why:  hospital follow up   Contact information:   [place] [address] [phone]        The results of significant diagnostics from this hospitalization (including imaging, microbiology, ancillary and laboratory) are listed below for reference.    Significant Diagnostic Studies: Dg Chest 2 View  12/22/2014   CLINICAL DATA:  Chest pain beginning last night with worsening shortness of breath, diaphoresis, nausea, and vomiting. Chest pressure radiates through the left shoulder.  EXAM: CHEST  2 VIEW  COMPARISON:  11/12/2014  FINDINGS: The cardiomediastinal silhouette is within normal limits. The lungs are mildly hyperinflated without evidence of airspace consolidation, edema, pleural effusion, or pneumothorax. There is minimal scarring in the left lung base. There may be a tiny granuloma or superimposed sclerotic osseous focus/bone  island in the right upper lobe. No acute osseous abnormality is identified.  IMPRESSION: No active cardiopulmonary disease.   Electronically Signed   By: Logan Bores   On: 12/22/2014 17:33   Ct Angio Chest Aorta W/cm &/or Wo/cm  12/22/2014   CLINICAL DATA:  Chest pain, shortness of breath, and weakness since last night. Nausea and vomiting, diaphoresis, diabetes.  EXAM: CT ANGIOGRAPHY CHEST WITH CONTRAST  TECHNIQUE: Multidetector CT imaging of the chest was performed using the standard protocol during bolus administration of intravenous contrast. Multiplanar CT image reconstructions and MIPs were obtained to evaluate the vascular anatomy.  CONTRAST:  85mL OMNIPAQUE IOHEXOL 350 MG/ML SOLN. Reduced contrast dose is utilized due to decreased GFR 39.  COMPARISON:  Chest radiograph 12/22/2014. CT angio chest 02/27/2009.  FINDINGS: Noncontrast CT images of the chest demonstrate calcification of the thoracic aorta. No significant Coronary artery calcification. Normal caliber at aorta. No evidence of intramural hematoma. Surgical absence of the gallbladder. Postoperative changes in the cervical spine.  Arterial phase contrast-enhanced images of the chest demonstrate normal caliber thoracic aorta. No evidence of aortic dissection  or aneurysm. But great vessel origins are patent. Left vertebral artery arises from the aortic arch. Normal heart size. Central pulmonary arteries are well opacified without evidence of significant pulmonary embolus.  Two nodules demonstrated in the right middle lung, largest measuring 9 mm diameter. These were not present on the previous study. Followup CT in 3 months is suggested. No focal airspace disease or consolidation. Mild dependent atelectasis in the lung bases. No pneumothorax. No pleural effusions.  Included portions of the upper abdominal organs demonstrate diffuse fatty infiltration of the liver and surgical absence of the gallbladder. Degenerative changes in the spine.  Review of  the MIP images confirms the above findings.  IMPRESSION: No evidence of dissection or aneurysm of the thoracic aorta. No significant pulmonary emboli. Indeterminate nodules demonstrated in the right middle lung measuring up to 9 mm diameter. Follow-up CT in 3 months is suggested.   Electronically Signed   By: Lucienne Capers M.D.   On: 12/22/2014 20:23    Microbiology: Recent Results (from the past 240 hour(s))  MRSA PCR Screening     Status: None   Collection Time: 12/23/14  6:08 AM  Result Value Ref Range Status   MRSA by PCR NEGATIVE NEGATIVE Final    Comment:        The GeneXpert MRSA Assay (FDA approved for NASAL specimens only), is one component of a comprehensive MRSA colonization surveillance program. It is not intended to diagnose MRSA infection nor to guide or monitor treatment for MRSA infections.      Labs: Basic Metabolic Panel:  Recent Labs Lab 12/22/14 1653  NA 134*  K 3.9  CL 107  CO2 23  GLUCOSE 255*  BUN 20  CREATININE 1.42*  CALCIUM 9.6   Liver Function Tests: No results for input(s): AST, ALT, ALKPHOS, BILITOT, PROT, ALBUMIN in the last 168 hours.  Recent Labs Lab 12/23/14 1126  LIPASE 33   No results for input(s): AMMONIA in the last 168 hours. CBC:  Recent Labs Lab 12/22/14 1653 12/23/14 0610  WBC 8.1 6.5  NEUTROABS 5.4  --   HGB 11.8* 9.3*  HCT 35.7* 28.6*  MCV 86.7 88.5  PLT 303 258   Cardiac Enzymes:  Recent Labs Lab 12/22/14 1653 12/22/14 2047 12/22/14 2158 12/23/14 0020  TROPONINI <0.03 <0.03 <0.03 <0.03   BNP: BNP (last 3 results) No results for input(s): BNP in the last 8760 hours.  ProBNP (last 3 results) No results for input(s): PROBNP in the last 8760 hours.  CBG:  Recent Labs Lab 12/22/14 2326 12/23/14 0802 12/23/14 1136  GLUCAP 214* 178* 163*       Signed:  Garner Dullea M  [facility] 12/23/2014, 2:02 PM

## 2014-12-23 NOTE — Care Management Note (Signed)
    Page 1 of 1   12/23/2014     2:13:18 PM CARE MANAGEMENT NOTE 12/23/2014  Patient:  Heather [name], Heather [name]   Account Number:  [id number]  Date Initiated:  12/23/2014  Documentation initiated by:  Theophilus Kinds  Subjective/Objective Assessment:   Pt admitted from home with CP. Pt lives with her husband and will return home at discharge. Pt is independent with ADL's.     Action/Plan:   No Cm needs noted.   Anticipated DC Date:  12/23/2014   Anticipated DC Plan:  [place]  CM consult      Choice offered to / List presented to:             Status of service:  Completed, signed off Medicare Important Message given?   (If response is "NO", the following Medicare IM given date fields will be blank) Date Medicare IM given:   Medicare IM given by:   Date Additional Medicare IM given:   Additional Medicare IM given by:    Discharge Disposition:  HOME/SELF CARE  Per UR Regulation:    If discussed at Long Length of Stay Meetings, dates discussed:    Comments:  12/23/14 [place], RN BSN CM

## 2014-12-23 NOTE — Consult Note (Signed)
CARDIOLOGY CONSULT NOTE   Patient ID: Heather [name] MRN: [id number] DOB/AGE: 1951-10-14 64 y.o.  Admit Date: 12/22/2014 Referring Physician: P[facility]-Hernandez Primary Physician: [name], MD Consulting Cardiologist: Carlyle Dolly MD Primary Cardiologist: Kate Sable MD Reason for Consultation: Chest Pain  Clinical Summary Heather [name] is a 63 y.o.female known history of hypertension, diabetes, GERD presented to ER with complaints of chest pain. She has a history of chronic chest pain, with normal NM stress test, normal echo in 2012, chronic dyspnea. She has cervical spine surgery in January due to cervical disc disease. She has been diagnosed with Crohn;s disease by Dr. Carlean Purl per capsule study in 2012.    She felt heart rate elevation and palpitations Tuesday am with associated chest pain described as sharp and pressure lower sternum, around 7:30. Pain and palpitations on and off all day, waxing and waning but never completely going away. She states it lasted into the night keeping her awake all night. She called our office yesterday afternoon around 1 pm to describe her symptoms. She was advised to go to [facility] ER as Dr. Bronson Ing was there. On her way to ER she had nausea and vomiting, dry heaves and diaphoresis.    In ER, BP 189/87, HR 108. Glucose was elevated at 255, sodium was 134, GFR 45. CT scan was negative for AAA, PE,. She was given ASA, zofran, heparin, NTG gtt, BB, Morphine, GI cocktail. She states that she had relieve of chest pain, but had abdominal pain. She states that pain has come back. It is constant.     Troponin is negative X 4. EKG is negative for acute ischemia, T-wave flattening in  V3-V4.     Allergies  Allergen Reactions  . Amlodipine Besylate Swelling  . Ciprofloxacin Nausea Only and Swelling  . Hydrocodone-Acetaminophen Itching  . Metformin Other (See Comments)    REACTION: GI UPSET  . Quinolones Swelling  . Sulfamethoxazole Swelling  .  Sulfonamide Derivatives   . Valsartan Swelling  . Lisinopril Swelling and Rash    Medications Scheduled Medications: . allopurinol  100 mg Oral Daily  . citalopram  20 mg Oral Daily  . ezetimibe  10 mg Oral QHS  . furosemide  40 mg Oral Daily  . heparin  5,000 Units Subcutaneous 3 times per day  . insulin aspart  0-15 Units Subcutaneous TID WC  . insulin aspart  0-5 Units Subcutaneous QHS  . insulin detemir  45 Units Subcutaneous QHS  . losartan  25 mg Oral Daily  . metoprolol  100 mg Oral BID  . pantoprazole  80 mg Oral Q1200  . potassium chloride SA  20 mEq Oral BID  . simvastatin  20 mg Oral QHS    Infusions: . sodium chloride 100 mL/hr at 12/22/14 2020  . sodium chloride 75 mL/hr at 12/23/14 0206    PRN Medications: acetaminophen, ALPRAZolam, gi cocktail, morphine injection, ondansetron (ZOFRAN) IV, ondansetron **OR** ondansetron (ZOFRAN) IV, polyethylene glycol   Past Medical History  Diagnosis Date  . Cellulitis and abscess     abdomen and buttocks  . Renal failure, unspecified   . Unspecified tinnitus     right  . Edema   . Diabetes mellitus   . Essential hypertension   . Headache(784.0)   . Irritable bowel syndrome   . Disorders of phosphorus metabolism   . Palpitations   . Depression   . Osteoarthrosis, unspecified whether generalized or localized, unspecified site   . B12 deficiency   . Pure  hypercholesterolemia   . Bronchitis, not specified as acute or chronic   . Reflux esophagitis   . Diabetic neuropathy   . Gout   . Hypopotassemia   . Folliculitis   . Insomnia   . DJD (degenerative joint disease)     right forminal stenosis C4-5  . Hiatal hernia     Past Surgical History  Procedure Laterality Date  . C-spine surgery      03/2007  . Cardiac catheterization    . Total abdominal hysterectomy    . Cholecystectomy    . Right knee arthroscopic surgery    . Colonoscopy  04/21/2011; 05/29/11    6/12: [facility] - ?AVM at IC valve, inflammatory  changes at [facility] valve [facility]); 7/12 - [facility]; IC valve erosions, look chronic and probably Crohn's per path  . Esophagogastroduodenoscopy  05/29/11    normal    Family History  Problem Relation Age of Onset  . Diabetes Mother   . Colon cancer Neg Hx     Social History Heather [name] reports that she has never smoked. She has never used smokeless tobacco. Heather [name] reports that she does not drink alcohol.  Review of Systems Complete review of systems are found to be negative unless outlined in H&P above.  Physical Examination Blood pressure 98/53, pulse 65, temperature 98.2 F (36.8 C), temperature source Oral, resp. rate 16, height 5\' 4"  (1.626 m), weight 161 lb 1.6 oz (73.074 kg), SpO2 97 %. No intake or output data in the 24 hours ending 12/23/14 0813  Telemetry: NSR  GEN: Continues to complain of abdominal pain and mild chest soreness HEENT: Conjunctiva and lids normal, oropharynx clear with moist mucosa. Neck: Supple, no elevated JVP or carotid bruits, no thyromegaly. Lungs: Clear to auscultation, nonlabored breathing at rest. Cardiac: Regular rate and rhythm, no S3 or significant systolic murmur, no pericardial rub. Abdomen: Soft, nontender, no hepatomegaly, bowel sounds present, no guarding or rebound. Extremities: No pitting edema, distal pulses 2+. Skin: Warm and dry. Musculoskeletal: No kyphosis. Neuropsychiatric: Alert and oriented x3, affect grossly appropriate.  Prior Cardiac Testing/Procedures  Echocardiogram 05/28/2013 Left ventricle: The cavity size was normal. There was mild concentric hypertrophy. Systolic function was normal. The estimated ejection fraction was in the range of 55% to 60%. Wall motion was normal; there were no regional wall motion abnormalities. Doppler parameters are consistent with abnormal left ventricular relaxation (grade 1 diastolic dysfunction). Borderline evidence of increased filling pressures. - Aortic  valve: Trileaflet; mildly thickened leaflets. No significant regurgitation. Mean gradient: 60mm Hg (S). Valve area: 2.38cm^2 (Vmax). - Mitral valve: Mild regurgitation. Valve area by continuity equation (using LVOT flow): 4.83cm^2. - Left atrium: The atrium was mildly dilated. - Right atrium: Central venous pressure: 4mm Hg (est). - Tricuspid valve: Mild regurgitation. - Pulmonary arteries: PA peak pressure: 62mm Hg (S). - Pericardium, extracardiac: There was no pericardial effusion.   Cardiac Cath 2003 1. Left main trunk: The left main trunk is a large caliber vessel,  angiographically normal. 2. LAD is a medium caliber vessel that bifurcates in its distal  section and provides several small diagonal branches along its  course. There are mild irregularities in the distal LAD. 3. Left circumflex artery is a medium caliber vessel that provides  two marginal branches in the mid section. The left circumflex  system has luminal irregularities. 4. Right coronary artery is dominant. This is a large caliber vessel  that provides the posterior descending artery and two posterior ventricular  branches in its  terminal segment. The right coronary artery has  luminal irregularities.1. Left main trunk: The left main trunk is a large caliber vessel,  angiographically normal. 2. LAD is a medium caliber vessel that bifurcates in its distal  section and provides several small diagonal branches along its  course. There are mild irregularities in the distal LAD. 3. Left circumflex artery is a medium caliber vessel that provides  two marginal branches in the mid section. The left circumflex  system has luminal irregularities. 4. Right coronary artery is dominant. This is a large caliber vessel  that provides the posterior descending artery and two posterior ventricular  branches in its terminal segment. The right coronary artery has  luminal  irregularities.  Lab Results  Basic Metabolic Panel:  Recent Labs Lab 12/22/14 1653  NA 134*  K 3.9  CL 107  CO2 23  GLUCOSE 255*  BUN 20  CREATININE 1.42*  CALCIUM 9.6   CBC:  Recent Labs Lab 12/22/14 1653 12/23/14 0610  WBC 8.1 6.5  NEUTROABS 5.4  --   HGB 11.8* 9.3*  HCT 35.7* 28.6*  MCV 86.7 88.5  PLT 303 258    Cardiac Enzymes:  Recent Labs Lab 12/22/14 1653 12/22/14 2047 12/22/14 2158 12/23/14 0020  TROPONINI <0.03 <0.03 <0.03 <0.03    Radiology: Dg Chest 2 View  12/22/2014   CLINICAL DATA:  Chest pain beginning last night with worsening shortness of breath, diaphoresis, nausea, and vomiting. Chest pressure radiates through the left shoulder.  EXAM: CHEST  2 VIEW  COMPARISON:  11/12/2014  FINDINGS: The cardiomediastinal silhouette is within normal limits. The lungs are mildly hyperinflated without evidence of airspace consolidation, edema, pleural effusion, or pneumothorax. There is minimal scarring in the left lung base. There may be a tiny granuloma or superimposed sclerotic osseous focus/bone island in the right upper lobe. No acute osseous abnormality is identified.  IMPRESSION: No active cardiopulmonary disease.   Electronically Signed   By: Logan Bores   On: 12/22/2014 17:33   Ct Angio Chest Aorta W/cm &/or Wo/cm  12/22/2014   CLINICAL DATA:  Chest pain, shortness of breath, and weakness since last night. Nausea and vomiting, diaphoresis, diabetes.  EXAM: CT ANGIOGRAPHY CHEST WITH CONTRAST  TECHNIQUE: Multidetector CT imaging of the chest was performed using the standard protocol during bolus administration of intravenous contrast. Multiplanar CT image reconstructions and MIPs were obtained to evaluate the vascular anatomy.  CONTRAST:  4mL OMNIPAQUE IOHEXOL 350 MG/ML SOLN. Reduced contrast dose is utilized due to decreased GFR 39.  COMPARISON:  Chest radiograph 12/22/2014. CT angio chest 02/27/2009.  FINDINGS: Noncontrast CT images of the chest  demonstrate calcification of the thoracic aorta. No significant Coronary artery calcification. Normal caliber at aorta. No evidence of intramural hematoma. Surgical absence of the gallbladder. Postoperative changes in the cervical spine.  Arterial phase contrast-enhanced images of the chest demonstrate normal caliber thoracic aorta. No evidence of aortic dissection or aneurysm. But great vessel origins are patent. Left vertebral artery arises from the aortic arch. Normal heart size. Central pulmonary arteries are well opacified without evidence of significant pulmonary embolus.  Two nodules demonstrated in the right middle lung, largest measuring 9 mm diameter. These were not present on the previous study. Followup CT in 3 months is suggested. No focal airspace disease or consolidation. Mild dependent atelectasis in the lung bases. No pneumothorax. No pleural effusions.  Included portions of the upper abdominal organs demonstrate diffuse fatty infiltration of the liver and surgical absence of the gallbladder. Degenerative  changes in the spine.  Review of the MIP images confirms the above findings.  IMPRESSION: No evidence of dissection or aneurysm of the thoracic aorta. No significant pulmonary emboli. Indeterminate nodules demonstrated in the right middle lung measuring up to 9 mm diameter. Follow-up CT in 3 months is suggested.   Electronically Signed   By: Lucienne Capers M.D.   On: 12/22/2014 20:23     ECG: NSR with T-wave flattening in V3-V4.    Impression and Recommendations  1.Chest Pain: Atypical, constant, lasting over 48 hours, associated with NV and diaphoresis. Troponin and EKG are not indicative of ACS. Pain is continuous but some relief with morphine. She had NM stress test in 2012.  No planned cardiac testing on admission.   2. Hypertension:  Slightly hypotensive as she has been receiving pain control with morphine. She is having some trouble remembering time table of her symptoms. Likely  due to narcotics. Review of home medications demonstrates lasix, losartan, metoprolol.   3. Diabetes: Blood glucose was elevated on admission. Now on sliding scale.   4.Anemia: Noted on labs this am.   5. Hx of GERD and Hiatal Hernia: Would consider GI consult or follow up with primary GI MD, Dr. Carlean Purl.   Signed: Phill Myron. Lawrence NP [place]  12/23/2014, 8:13 AM Co-Sign MD  Patient seen and discussed with NP Purcell Nails, I agree with her documentation above. 64 yo female hx of HTN, DM, long history of chronic chest pain with negative testing including normal cath 2003, 2005, 2012  and negative  nuclear stress 05/2013.   CXR no acute process CT PE no aortic pathology, no PE 04/2013 echo: LVEF 0000000, grade I diastolic dysfunction - K 3.9 Cr 1.42 (baseline 1.4-1.6), Hgb 11.8 Plt 303 trop neg x 4 EKG SR no acute ischemic changes. Manual QTc 480  Patient with long history of chronic chest pain with negative caths x 3, and a fairly recent negative nuclear stress test. Presents with atypical chest pain symptoms lasting consistently for over 48 hrs without objective evidence of ischemia by EKG or enzymes. Do not see strong indication to repeat ischemic testing at this time. Significant N/V may suggest GI source. History of prior disc disease in her neck which could also be related. No further cardiac testing at this time, will sign off of inpatient care. F/u 2 weeks with NP Purcell Nails after discharge.    Zandra Abts MD

## 2014-12-24 LAB — HEMOGLOBIN A1C
HEMOGLOBIN A1C: 9 % — AB (ref 4.8–5.6)
MEAN PLASMA GLUCOSE: 212 mg/dL

## 2015-01-24 ENCOUNTER — Ambulatory Visit: Payer: Medicare Other | Admitting: Cardiovascular Disease

## 2015-02-25 ENCOUNTER — Encounter: Payer: Self-pay | Admitting: Neurology

## 2015-02-25 ENCOUNTER — Ambulatory Visit (INDEPENDENT_AMBULATORY_CARE_PROVIDER_SITE_OTHER): Payer: Medicare Other | Admitting: Neurology

## 2015-02-25 VITALS — BP 165/80 | HR 71 | Ht 63.0 in | Wt 165.0 lb

## 2015-02-25 DIAGNOSIS — G629 Polyneuropathy, unspecified: Secondary | ICD-10-CM

## 2015-02-25 DIAGNOSIS — I10 Essential (primary) hypertension: Secondary | ICD-10-CM | POA: Diagnosis not present

## 2015-02-25 DIAGNOSIS — R404 Transient alteration of awareness: Secondary | ICD-10-CM | POA: Diagnosis not present

## 2015-02-25 NOTE — Progress Notes (Signed)
NEUROLOGY CONSULTATION NOTE  Heather [name] MRN: [id number] DOB: [birth year]  Referring provider: Dr. [name] Primary care provider: Dr. [name]  Reason for consult:  Seizures, headache, unsteady gait  Dear Dr Manuella Ghazi:  Thank you for your kind referral of Heather [name] for consultation of the above symptoms. Although her history is well known to you, please allow me to reiterate it for the purpose of our medical record. The patient was accompanied to the clinic by her husband who also provides collateral information. Records and images were personally reviewed where available.  HISTORY OF PRESENT ILLNESS: This is a 64 year old right-handed woman with a history of hypertension, diabetes, CKD, B12 deficiency, gout, presenting for recurrent episodes of loss of consciousness, headaches, and unsteady gait. She reports she was in her usual state of health until February 2016 when she had an episode of loss of consciousness while eating breakfast. She was sitting at the table eating, then her head went forward. She was unresponsive with her eyes closed. She reports that her family helped her to the car and she was noted to be dragging her left leg. She has been to [facility] twice and one time at [facility]. Records from [place] unavailable for review. On review of records from [facility], there is an admission in 11/2014 but no mention of loss of consciousness. She was admitted for chest pain at that time with unclear etiology, unlikely cardiac. She reports at least 3 more episodes of loss of consciousness, one time she had it while standing and fell. She reports one episode lasted for 2-3 hours where she was passed out. She now has a cardiac monitor. She reports this week she started feeling weak and tired, she lay her head back and called her husband by blowing on the car horn. When he arrived, she was having one and woke up 30 minutes later. After the episodes she feels weak and  has no recollection of events, with a splitting headache on the left side and nausea. She had taken two tablets of Aleve. After these episodes she feels that she cannot move her left side for an hour. She denies any paresthesias, just that it feels weak. She does have chronic left-sided weakness after neck surgery in January 2016, but states the weakness is worse after these spells.   She has a constant sense of imbalance, where she feels she would fall if she turns fast. She denies any back or leg pain, no bowel/bladder dysfunction except for chronic symptoms from her IBS. She has a diagnosis of neuropathy in both feet, with a sensation of needles. She denies any diplopia, dysarthria, dysphagia. She occasionally "gets the shakes." She denies any olfactory/gustatory hallucinations, deja vu, rising epigastric sensation, myoclonic jerks. She had a normal birth and early development.  There is no history of febrile convulsions, CNS infections such as meningitis/encephalitis, significant traumatic brain injury, neurosurgical procedures, or family history of seizures.  PAST MEDICAL HISTORY: Past Medical History  Diagnosis Date  . Cellulitis and abscess     abdomen and buttocks  . Renal failure, unspecified   . Unspecified tinnitus     right  . Edema   . Diabetes mellitus   . Essential hypertension   . Headache(784.0)   . Irritable bowel syndrome   . Disorders of phosphorus metabolism   . Palpitations   . Depression   . Osteoarthrosis, unspecified whether generalized or localized, unspecified site   . B12 deficiency   .  Pure hypercholesterolemia   . Bronchitis, not specified as acute or chronic   . Reflux esophagitis   . Diabetic neuropathy   . Gout   . Hypopotassemia   . Folliculitis   . Insomnia   . DJD (degenerative joint disease)     right forminal stenosis C4-5  . Hiatal hernia     PAST SURGICAL HISTORY: Past Surgical History  Procedure Laterality Date  . C-spine surgery       03/2007  . Cardiac catheterization    . Total abdominal hysterectomy    . Cholecystectomy    . Right knee arthroscopic surgery    . Colonoscopy  04/21/2011; 05/29/11    6/12: [facility] - ?AVM at IC valve, inflammatory changes at [facility] valve [facility]); 7/12 - [facility]; IC valve erosions, look chronic and probably Crohn's per path  . Esophagogastroduodenoscopy  05/29/11    normal    MEDICATIONS: Current Outpatient Prescriptions on File Prior to Visit  Medication Sig Dispense Refill  . allopurinol (ZYLOPRIM) 100 MG tablet Take 100 mg by mouth daily.    . cholecalciferol (VITAMIN D) 1000 UNITS tablet Take 1,000 Units by mouth daily.      . citalopram (CELEXA) 20 MG tablet Take 20 mg by mouth daily.      . cyanocobalamin (,VITAMIN B-12,) 1000 MCG/ML injection Inject 1,000 mcg into the muscle every 30 (thirty) days.      Marland Kitchen esomeprazole (NEXIUM) 40 MG capsule Take 40 mg by mouth daily before breakfast.      . ezetimibe-simvastatin (VYTORIN) 10-20 MG per tablet Take 1 tablet by mouth at bedtime.    . furosemide (LASIX) 40 MG tablet Take 40 mg by mouth daily.      . indomethacin (INDOCIN) 50 MG capsule Take 50 mg by mouth 3 (three) times daily as needed (pain).     . insulin detemir (LEVEMIR FLEXPEN) 100 UNIT/ML injection Inject 90 Units into the skin at bedtime.     . insulin lispro (HUMALOG KWIKPEN) 100 UNIT/ML SOPN Inject 10-50 Units into the skin 3 (three) times daily with meals.     Marland Kitchen losartan (COZAAR) 25 MG tablet Take 25 mg by mouth daily.    . metoprolol (LOPRESSOR) 100 MG tablet Take 100 mg by mouth 2 (two) times daily.    . potassium chloride SA (K-DUR,KLOR-CON) 20 MEQ tablet Take 20 mEq by mouth 2 (two) times daily.    Marland Kitchen tiZANidine (ZANAFLEX) 4 MG tablet Take 8 mg by mouth at bedtime.      No current facility-administered medications on file prior to visit.    ALLERGIES: Allergies  Allergen Reactions  . Amlodipine Besylate Swelling  . Ciprofloxacin Nausea Only and Swelling  .  Hydrocodone-Acetaminophen Itching  . Metformin Other (See Comments)    REACTION: GI UPSET  . Quinolones Swelling  . Sulfamethoxazole Swelling  . Sulfonamide Derivatives   . Valsartan Swelling  . Lisinopril Swelling and Rash    FAMILY HISTORY: Family History  Problem Relation Age of Onset  . Diabetes Mother   . Colon cancer Neg Hx     SOCIAL HISTORY: History   Social History  . Marital Status: Married    Spouse Name: N/A  . Number of Children: 2  . Years of Education: N/A   Occupational History  . Disabled    Social History Main Topics  . Smoking status: Never Smoker   . Smokeless tobacco: Never Used  . Alcohol Use: No  . Drug Use: No  . Sexual  Activity: Not on file   Other Topics Concern  . Not on file   Social History Narrative   2 glasses of tea daily     REVIEW OF SYSTEMS: Constitutional: No fevers, chills, or sweats, no generalized fatigue, change in appetite Eyes: No visual changes, double vision, eye pain Ear, nose and throat: No hearing loss, ear pain, nasal congestion, sore throat Cardiovascular: No chest pain, palpitations Respiratory:  No shortness of breath at rest or with exertion, wheezes GastrointestinaI: No nausea, vomiting, diarrhea, abdominal pain, fecal incontinence Genitourinary:  No dysuria, urinary retention or frequency Musculoskeletal:  No neck pain, back pain Integumentary: No rash, pruritus, skin lesions Neurological: as above Psychiatric: No depression, insomnia, anxiety Endocrine: No palpitations, fatigue, diaphoresis, mood swings, change in appetite, change in weight, increased thirst Hematologic/Lymphatic:  No anemia, purpura, petechiae. Allergic/Immunologic: no itchy/runny eyes, nasal congestion, recent allergic reactions, rashes  PHYSICAL EXAM: Filed Vitals:   02/25/15 0952  BP: 165/80  Pulse: 71   General: No acute distress Head:  Normocephalic/atraumatic Eyes: Fundoscopic exam shows bilateral sharp discs, no vessel  changes, exudates, or hemorrhages Neck: supple, no paraspinal tenderness, full range of motion Back: No paraspinal tenderness Heart: regular rate and rhythm Lungs: Clear to auscultation bilaterally. Vascular: No carotid bruits. Skin/Extremities: No rash, no edema Neurological Exam: Mental status: alert and oriented to person, place, and time, no dysarthria or aphasia, Fund of knowledge is appropriate.  Recent and remote memory are intact.  Attention and concentration are normal.    Able to name objects and repeat phrases. Cranial nerves: CN I: not tested CN II: pupils equal, round and reactive to light, visual fields intact, fundi unremarkable. CN III, IV, VI:  full range of motion, no nystagmus, no ptosis CN V: facial sensation intact CN VII: upper and lower face symmetric CN VIII: hearing intact to finger rub CN IX, X: gag intact, uvula midline CN XI: sternocleidomastoid and trapezius muscles intact CN XII: tongue midline Bulk & Tone: normal, no fasciculations. Motor: 5/5 throughout with no pronator drift. Sensation: decreased vibration up to ankles bilaterally, otherwise intact to light touch, cold, pin, and joint position sense.  No extinction to double simultaneous stimulation.  Romberg test negative Deep Tendon Reflexes: +2 throughout, no ankle clonus Plantar responses: downgoing bilaterally Cerebellar: no incoordination on finger to nose, heel to shin. No dysdiadochokinesia Gait: slow and cautious due to left knee pain, no ataxia. Tremor: none  IMPRESSION: This is a 64 year old right-handed woman with a history of hypertension, diabetes, CKD, presenting with recurrent episodes of loss of consciousness and gait instability. She reports the headaches occur after the episodes of loss of consciousness. She also reports left-sided weakness after these spells that last for an hour. The etiology of her symptoms is unclear, she currently has a holter monitor. Seizures are a consideration,  with possible post-ictal Todd's paralysis on the left, however the prolonged duration of loss of consciousness for up to 3 hours is atypical, raising the question of non-epileptic events. Records from [place] will be requested for review. She reports having an MRI brain done during one of her admissions. If not done, an EEG will be ordered, she may benefit from prolonged EEG to further classify these episodes. The gait instability may be due to neuropathy, with mild length-dependent neuropathy noted on exam today. [place] driving laws were discussed with the patient, and she knows to stop driving after an episode of loss of consciousness, until 6 months event-free. She will follow-up in 2  months.   Thank you for allowing me to participate in the care of this patient. Please do not hesitate to call for any questions or concerns.   Ellouise Newer, M.D.  CC: [name]

## 2015-02-25 NOTE — Patient Instructions (Signed)
1. We will obtain records of your hospital admissions at [facility] 2. We will plan to do an EEG, depending on tests done at [facility] 3. As per [place] driving laws, after an episode of loss of consciousness, one should not drive until 6 months event-free 4. Follow-up in 2 months

## 2015-03-01 ENCOUNTER — Telehealth: Payer: Self-pay | Admitting: Neurology

## 2015-03-01 DIAGNOSIS — R404 Transient alteration of awareness: Secondary | ICD-10-CM

## 2015-03-01 NOTE — Telephone Encounter (Signed)
Wants to know the status of the test [facility] please call [phone]

## 2015-03-01 NOTE — Telephone Encounter (Signed)
She wants to know if you've had a chance to review her records from [facility]. She said you were going to let her know if she should have an EEG.

## 2015-03-01 NOTE — Telephone Encounter (Signed)
Pls let her know I reviewed [facility] records from March admission, no EEG done. Pls order routine EEG. Thanks

## 2015-03-02 DIAGNOSIS — G629 Polyneuropathy, unspecified: Secondary | ICD-10-CM | POA: Insufficient documentation

## 2015-03-02 DIAGNOSIS — R404 Transient alteration of awareness: Secondary | ICD-10-CM | POA: Insufficient documentation

## 2015-03-02 NOTE — Telephone Encounter (Signed)
Lmovm to return my call. 

## 2015-03-03 NOTE — Telephone Encounter (Signed)
I spoke with patient and she is agreeable to EEG. Will put in order for that & she will be called about appt.

## 2015-03-07 ENCOUNTER — Ambulatory Visit (INDEPENDENT_AMBULATORY_CARE_PROVIDER_SITE_OTHER): Payer: Medicare Other | Admitting: Neurology

## 2015-03-07 DIAGNOSIS — R404 Transient alteration of awareness: Secondary | ICD-10-CM | POA: Diagnosis not present

## 2015-03-09 ENCOUNTER — Telehealth: Payer: Self-pay | Admitting: Family Medicine

## 2015-03-09 DIAGNOSIS — R404 Transient alteration of awareness: Secondary | ICD-10-CM

## 2015-03-09 NOTE — Telephone Encounter (Signed)
-----   Message from Cameron Sprang, MD sent at 03/09/2015  1:15 PM EDT ----- Please let patient know routine EEG is normal, would proceed with 24-hour EEG to try to capture these episodes

## 2015-03-09 NOTE — Telephone Encounter (Signed)
Patient was notified of results & advisement. She is agreeable to the 24 hour eeg. Order placed for 24 hour eeg & taken to the front desk to be scheduled.

## 2015-03-09 NOTE — Procedures (Signed)
ELECTROENCEPHALOGRAM REPORT  Date of Study: 03/07/2015  Patient's Name: Heather [name] MRN: [id number] Date of Birth: 07/09/1951  Referring Provider: Dr. Ellouise Newer  Clinical History: This is a 64 year old woman with recurrent episodes of loss of consciousness followed by left-sided weakness.   Medications: Celexa, Lasix, Zanaflex  Technical Summary: A multichannel digital EEG recording measured by the international 10-20 system with electrodes applied with paste and impedances below 5000 ohms performed in our laboratory with EKG monitoring in an awake and drowsy patient.  Hyperventilation and photic stimulation were performed.  The digital EEG was referentially recorded, reformatted, and digitally filtered in a variety of bipolar and referential montages for optimal display.    Description: The patient is awake and drowsy during the recording.  During maximal wakefulness, there is a symmetric, medium voltage 8 Hz posterior dominant rhythm that attenuates with eye opening.  The record is symmetric.  During drowsiness, there is an increase in theta slowing of the background.  Deeper stages of sleep were not seen. Hyperventilation and photic stimulation did not elicit any abnormalities.  There were no epileptiform discharges or electrographic seizures seen.    EKG lead was unremarkable.  Impression: This awake and drowsy EEG is normal.    Clinical Correlation: A normal EEG does not exclude a clinical diagnosis of epilepsy.  If further clinical questions remain, prolonged EEG may be helpful.  Clinical correlation is advised.   Ellouise Newer, M.D.

## 2015-03-10 ENCOUNTER — Telehealth: Payer: Self-pay | Admitting: Neurology

## 2015-03-10 NOTE — Telephone Encounter (Signed)
Called to set up 24 hour EEG appointment. She is under driving restrictions and her husband is having knee surgery at the end of this month. First available appointment is in June. I told her I will see if there are any cancellations and I will call her back.

## 2015-03-14 NOTE — Telephone Encounter (Signed)
Called and left a msg on both cell and home phones that we can do her 24 hour EEG on Wednesday May 18 at 9am. Requested a call back if unable to make this appt. Also asked her to wear a comfortable T-shirt that she is able to sleep in for the test and that she bring a scarf or hat to wear to covet the electrodes.

## 2015-03-15 ENCOUNTER — Telehealth: Payer: Self-pay | Admitting: Neurology

## 2015-03-15 NOTE — Telephone Encounter (Signed)
Notes reviewed from [facility].  MRI brain w/o contrast 01/13/15 showed no evidence of acute infarct, intracranial hemorrhage, mass, midline shift, or extra-axial fluid collection. There are scattered, small foci of T2 hyperintenisty in the cerebral white matter bilaterally. There is a small region of confluent T2/FLAIR hyperintensity in the posterior left putamen with normal to mildly facilitated diffusion, favored to represent subacute ischemia  No EEG report seen.

## 2015-03-16 ENCOUNTER — Ambulatory Visit (INDEPENDENT_AMBULATORY_CARE_PROVIDER_SITE_OTHER): Payer: Medicare Other | Admitting: Neurology

## 2015-03-16 DIAGNOSIS — R404 Transient alteration of awareness: Secondary | ICD-10-CM

## 2015-04-01 ENCOUNTER — Telehealth: Payer: Self-pay | Admitting: Neurology

## 2015-04-01 NOTE — Telephone Encounter (Signed)
Have you had a chance to review her results?

## 2015-04-01 NOTE — Telephone Encounter (Signed)
Pt wants the results of the EEG please call [phone]

## 2015-04-04 NOTE — Telephone Encounter (Signed)
Pls let her know EEG is normal, no seizure discharges seen. Thanks

## 2015-04-04 NOTE — Telephone Encounter (Signed)
Lmovm to return my call. 

## 2015-04-05 NOTE — Procedures (Signed)
ELECTROENCEPHALOGRAM REPORT  Dates of Recording: 03/16/2015 to 03/17/2015  Patient's Name: Heather [name] MRN: [id number] Date of Birth: [birth year]  Referring Provider: Dr. Ellouise Newer  Procedure: 24-hour ambulatory EEG  History: This is a 64 year old woman with recurrent episodes of loss of consciousness lasting up to 3 hours, gait instability, and left-sided weakness after these spells. EEG for classification  Medications: Celexa, llopurinol, Vytorin, metoprolol, Indomethacin, insulin  Technical Summary: This is a 24-hour multichannel digital EEG recording measured by the international 10-20 system with electrodes applied with paste and impedances below 5000 ohms performed as portable with EKG monitoring.  The digital EEG was referentially recorded, reformatted, and digitally filtered in a variety of bipolar and referential montages for optimal display.    DESCRIPTION OF RECORDING: During maximal wakefulness, the background activity consisted of a symmetric 8-8.5 Hz posterior dominant rhythm which was reactive to eye opening.  There were no epileptiform discharges or focal slowing seen in wakefulness.  During the recording, the patient progresses through wakefulness, drowsiness, and Stage 2 sleep.  Again, there were no epileptiform discharges seen.  Events: There were no push button events. Typical events were not reported.  There were no electrographic seizures seen.  EKG lead was unremarkable.  IMPRESSION: This 24-hour ambulatory EEG study is normal.    CLINICAL CORRELATION: A normal EEG does not exclude a clinical diagnosis of epilepsy. Typical events were not captured.  If further clinical questions remain, inpatient video EEG monitoring may be helpful.   Ellouise Newer, M.D.

## 2015-04-05 NOTE — Telephone Encounter (Signed)
Called patient again and notified of results.

## 2015-04-18 ENCOUNTER — Telehealth: Payer: Self-pay | Admitting: Neurology

## 2015-04-18 NOTE — Telephone Encounter (Signed)
[phone] pt states that we did not get the EEG  approved with her ins and she has questions please call her back

## 2015-04-18 NOTE — Telephone Encounter (Signed)
error 

## 2015-04-19 NOTE — Telephone Encounter (Signed)
Patient states she received 2 bills from her insurance for her EEG one for $225.00 & the other for $50.00. She states when she called her insurance co told her that EEG hadn't been authorized. She has Medicaid & Medicare.

## 2015-04-19 NOTE — Telephone Encounter (Signed)
Lmovm to return my call. 

## 2015-04-20 ENCOUNTER — Ambulatory Visit: Payer: Medicare Other | Admitting: Neurology

## 2015-04-20 NOTE — Telephone Encounter (Signed)
Ok she is aware. I told her yesterday that it would be looked into and she will get a call back.

## 2015-04-20 NOTE — Telephone Encounter (Signed)
I will research this and call the patient back sometime this week. Thanks / Venida Jarvis

## 2015-04-29 ENCOUNTER — Encounter: Payer: Self-pay | Admitting: Neurology

## 2015-04-29 ENCOUNTER — Ambulatory Visit (INDEPENDENT_AMBULATORY_CARE_PROVIDER_SITE_OTHER): Payer: Medicare Other | Admitting: Neurology

## 2015-04-29 VITALS — BP 124/60 | HR 72 | Resp 16 | Ht 63.0 in | Wt 168.0 lb

## 2015-04-29 DIAGNOSIS — R55 Syncope and collapse: Secondary | ICD-10-CM | POA: Diagnosis not present

## 2015-04-29 NOTE — Patient Instructions (Signed)
1. Follow up on an as needed basis, call our office for any change in symptoms 2. After an episode of loss of consciousness, one should not drive until 6 months event-free

## 2015-04-29 NOTE — Progress Notes (Signed)
NEUROLOGY FOLLOW UP OFFICE NOTE  KIRK CHEFF [id number]  HISTORY OF PRESENT ILLNESS: I had the pleasure of seeing Heather [name] in follow-up in the neurology clinic on 04/29/2015.  The patient was last seen 2 months ago for recurrent episodes of loss of consciousness, headaches, and unsteady gait. Records from [place] were reviewed, her MRI brain done March 2016 reported no evidence of acute infarct, intracranial hemorrhage, mass, or midline shift. There was note of scattered, small foci of T2 hyperintensity in the cerebral white matter bilaterally. There is note of a small region of confluent T2/FLAIR hyperintenisty in the posterior left putamen with normal to mildly facilitated diffusion, favored to represent subacute ischemia. She had a routine and 24-hour EEG which I personally reviewed, these were normal, typical events were not captured. She presents today reporting she has been doing very well with no further episodes of loss of consciousness, left-sided weakness, unsteadiness or falls. She also denies any headaches, dizziness, vision changes, focal numbness/tingling/weakness.   HPI: This is a 64 yo RH woman with a history of hypertension, diabetes, CKD, B12 deficiency, gout, who presented with recurrent episodes of loss of consciousness, headaches, and unsteady gait. She reports she was in her usual state of health until November 01, 2014 when she had an episode of loss of consciousness while eating breakfast. She was sitting at the table eating, then her head went forward. She was unresponsive with her eyes closed. She reports that her family helped her to the car and she was noted to be dragging her left leg. She has been to [facility] twice and one time at [facility]. Records from [place] unavailable for review. On review of records from [facility], there is an admission in 11/2014 but no mention of loss of consciousness. She was admitted for chest pain at that time with  unclear etiology, unlikely cardiac. She reports at least 3 more episodes of loss of consciousness, one time she had it while standing and fell. She reports one episode lasted for 2-3 hours where she was passed out. Holter monitor normal per patient. After the episodes she feels weak and has no recollection of events, with a splitting headache on the left side and nausea. She had taken two tablets of Aleve. After these episodes she feels that she cannot move her left side for an hour. She denies any paresthesias, just that it feels weak. She does have chronic left-sided weakness after neck surgery in January 2016, but states the weakness is worse after these spells.   She has a constant sense of imbalance, where she feels she would fall if she turns fast. She denies any back or leg pain, no bowel/bladder dysfunction except for chronic symptoms from her IBS. She has a diagnosis of neuropathy in both feet, with a sensation of needles. She denies any diplopia, dysarthria, dysphagia. She occasionally "gets the shakes."   PAST MEDICAL HISTORY: Past Medical History  Diagnosis Date  . Cellulitis and abscess     abdomen and buttocks  . Renal failure, unspecified   . Unspecified tinnitus     right  . Edema   . Diabetes mellitus   . Essential hypertension   . Headache(784.0)   . Irritable bowel syndrome   . Disorders of phosphorus metabolism   . Palpitations   . Depression   . Osteoarthrosis, unspecified whether generalized or localized, unspecified site   . B12 deficiency   . Pure hypercholesterolemia   . Bronchitis, not specified as acute  or chronic   . Reflux esophagitis   . Diabetic neuropathy   . Gout   . Hypopotassemia   . Folliculitis   . Insomnia   . DJD (degenerative joint disease)     right forminal stenosis C4-5  . Hiatal hernia     MEDICATIONS: Current Outpatient Prescriptions on File Prior to Visit  Medication Sig Dispense Refill  . allopurinol (ZYLOPRIM) 100 MG tablet Take  100 mg by mouth daily.    . cholecalciferol (VITAMIN D) 1000 UNITS tablet Take 1,000 Units by mouth daily.      . citalopram (CELEXA) 20 MG tablet Take 20 mg by mouth daily.      . cyanocobalamin (,VITAMIN B-12,) 1000 MCG/ML injection Inject 1,000 mcg into the muscle every 30 (thirty) days.      Marland Kitchen esomeprazole (NEXIUM) 40 MG capsule Take 40 mg by mouth daily before breakfast.      . ezetimibe-simvastatin (VYTORIN) 10-20 MG per tablet Take 1 tablet by mouth at bedtime.    . furosemide (LASIX) 40 MG tablet Take 40 mg by mouth daily.      . indomethacin (INDOCIN) 50 MG capsule Take 50 mg by mouth 3 (three) times daily as needed (pain).     . insulin detemir (LEVEMIR FLEXPEN) 100 UNIT/ML injection Inject 90 Units into the skin at bedtime.     . insulin lispro (HUMALOG KWIKPEN) 100 UNIT/ML SOPN Inject 10-50 Units into the skin 3 (three) times daily with meals.     Marland Kitchen losartan (COZAAR) 25 MG tablet Take 25 mg by mouth daily.    . metoprolol (LOPRESSOR) 100 MG tablet Take 100 mg by mouth 2 (two) times daily.    . potassium chloride SA (K-DUR,KLOR-CON) 20 MEQ tablet Take 20 mEq by mouth once.     Marland Kitchen tiZANidine (ZANAFLEX) 4 MG tablet Take 8 mg by mouth at bedtime as needed.      No current facility-administered medications on file prior to visit.    ALLERGIES: Allergies  Allergen Reactions  . Amlodipine Besylate Swelling  . Ciprofloxacin Nausea Only and Swelling  . Hydrocodone-Acetaminophen Itching  . Metformin Other (See Comments)    REACTION: GI UPSET  . Quinolones Swelling  . Sulfamethoxazole Swelling  . Sulfonamide Derivatives   . Valsartan Swelling  . Lisinopril Swelling and Rash    FAMILY HISTORY: Family History  Problem Relation Age of Onset  . Diabetes Mother   . Colon cancer Neg Hx     SOCIAL HISTORY: History   Social History  . Marital Status: Married    Spouse Name: N/A  . Number of Children: 2  . Years of Education: N/A   Occupational History  . Disabled    Social  History Main Topics  . Smoking status: Never Smoker   . Smokeless tobacco: Never Used  . Alcohol Use: No  . Drug Use: No  . Sexual Activity: Not on file   Other Topics Concern  . Not on file   Social History Narrative   2 glasses of tea daily     REVIEW OF SYSTEMS: Constitutional: No fevers, chills, or sweats, no generalized fatigue, change in appetite Eyes: No visual changes, double vision, eye pain Ear, nose and throat: No hearing loss, ear pain, nasal congestion, sore throat Cardiovascular: No chest pain, palpitations Respiratory:  No shortness of breath at rest or with exertion, wheezes GastrointestinaI: No nausea, vomiting, diarrhea, abdominal pain, fecal incontinence Genitourinary:  No dysuria, urinary retention or frequency Musculoskeletal:  No neck  pain, back pain Integumentary: No rash, pruritus, skin lesions Neurological: as above Psychiatric: No depression, insomnia, anxiety Endocrine: No palpitations, fatigue, diaphoresis, mood swings, change in appetite, change in weight, increased thirst Hematologic/Lymphatic:  No anemia, purpura, petechiae. Allergic/Immunologic: no itchy/runny eyes, nasal congestion, recent allergic reactions, rashes  PHYSICAL EXAM: Filed Vitals:   04/29/15 1458  BP: 124/60  Pulse: 72  Resp: 16   General: No acute distress Head:  Normocephalic/atraumatic Neck: supple, no paraspinal tenderness, full range of motion Heart:  Regular rate and rhythm Lungs:  Clear to auscultation bilaterally Back: No paraspinal tenderness Skin/Extremities: No rash, no edema Neurological Exam: alert and oriented to person, place, and time. No aphasia or dysarthria. Fund of knowledge is appropriate.  Recent and remote memory are intact.  Attention and concentration are normal.    Able to name objects and repeat phrases. Cranial nerves: Pupils equal, round, reactive to light.  Fundoscopic exam unremarkable, no papilledema. Extraocular movements intact with no  nystagmus. Visual fields full. Facial sensation intact. No facial asymmetry. Tongue, uvula, palate midline.  Motor: Bulk and tone normal, muscle strength 5/5 throughout with no pronator drift.  Sensation to light touch intact.  No extinction to double simultaneous stimulation.  Deep tendon reflexes 2+ throughout, toes downgoing.  Finger to nose testing intact.  Gait narrow-based and steady, able to tandem walk adequately.  Romberg negative.  IMPRESSION: This is a 64 yo RH woman with a history of hypertension, diabetes, CKD, who presented for evaluation of recurrent episodes of loss of consciousness and gait instability. She reported headaches occur after the episodes of loss of consciousness. She also reports left-sided weakness after these spells that last for an hour. Per [place] report, no acute changes on MRI brain. Her routine and 24-hour EEG were normal. She has had a cardiac evaluation which was normal as well. The patient feels symptoms were due to her having several surgeries around that time and her body being "down." Her neurological exam today is normal, she denies any symptoms. Psychogenic cause of recent symptoms is still a consideration. We discussed [place] driving laws that indicate one should stop driving after an episode of loss of consciousness, until 6 months event-free. She will follow-up on a prn basis and knows to call our office for any change in symptoms.   Thank you for allowing me to participate in her care.  Please do not hesitate to call for any questions or concerns.  The duration of this appointment visit was 15 minutes of face-to-face time with the patient.  Greater than 50% of this time was spent in counseling, explanation of diagnosis, planning of further management, and coordination of care.   Ellouise Newer, M.D.   CC: [name]

## 2015-06-22 ENCOUNTER — Emergency Department (HOSPITAL_COMMUNITY): Payer: Medicare Other

## 2015-06-22 ENCOUNTER — Emergency Department (HOSPITAL_COMMUNITY)
Admission: EM | Admit: 2015-06-22 | Discharge: 2015-06-22 | Disposition: A | Discharge status: Home / Self Care | Payer: Medicare Other | Attending: Emergency Medicine | Admitting: Emergency Medicine

## 2015-06-22 ENCOUNTER — Encounter (HOSPITAL_COMMUNITY): Payer: Self-pay | Admitting: Emergency Medicine

## 2015-06-22 DIAGNOSIS — E538 Deficiency of other specified B group vitamins: Secondary | ICD-10-CM | POA: Insufficient documentation

## 2015-06-22 DIAGNOSIS — M722 Plantar fascial fibromatosis: Secondary | ICD-10-CM | POA: Diagnosis not present

## 2015-06-22 DIAGNOSIS — F329 Major depressive disorder, single episode, unspecified: Secondary | ICD-10-CM | POA: Insufficient documentation

## 2015-06-22 DIAGNOSIS — Z79899 Other long term (current) drug therapy: Secondary | ICD-10-CM | POA: Diagnosis not present

## 2015-06-22 DIAGNOSIS — I10 Essential (primary) hypertension: Secondary | ICD-10-CM | POA: Insufficient documentation

## 2015-06-22 DIAGNOSIS — M1990 Unspecified osteoarthritis, unspecified site: Secondary | ICD-10-CM | POA: Diagnosis not present

## 2015-06-22 DIAGNOSIS — Z9889 Other specified postprocedural states: Secondary | ICD-10-CM | POA: Diagnosis not present

## 2015-06-22 DIAGNOSIS — Z872 Personal history of diseases of the skin and subcutaneous tissue: Secondary | ICD-10-CM | POA: Insufficient documentation

## 2015-06-22 DIAGNOSIS — Z8709 Personal history of other diseases of the respiratory system: Secondary | ICD-10-CM | POA: Insufficient documentation

## 2015-06-22 DIAGNOSIS — K219 Gastro-esophageal reflux disease without esophagitis: Secondary | ICD-10-CM | POA: Diagnosis not present

## 2015-06-22 DIAGNOSIS — Z8669 Personal history of other diseases of the nervous system and sense organs: Secondary | ICD-10-CM | POA: Insufficient documentation

## 2015-06-22 DIAGNOSIS — E1140 Type 2 diabetes mellitus with diabetic neuropathy, unspecified: Secondary | ICD-10-CM | POA: Insufficient documentation

## 2015-06-22 DIAGNOSIS — M109 Gout, unspecified: Secondary | ICD-10-CM | POA: Insufficient documentation

## 2015-06-22 DIAGNOSIS — M25572 Pain in left ankle and joints of left foot: Secondary | ICD-10-CM | POA: Diagnosis present

## 2015-06-22 DIAGNOSIS — Z794 Long term (current) use of insulin: Secondary | ICD-10-CM | POA: Insufficient documentation

## 2015-06-22 DIAGNOSIS — E876 Hypokalemia: Secondary | ICD-10-CM | POA: Diagnosis not present

## 2015-06-22 DIAGNOSIS — Z87448 Personal history of other diseases of urinary system: Secondary | ICD-10-CM | POA: Insufficient documentation

## 2015-06-22 DIAGNOSIS — E780 Pure hypercholesterolemia: Secondary | ICD-10-CM | POA: Diagnosis not present

## 2015-06-22 MED ORDER — MELOXICAM 15 MG PO TABS: 15.0000 mg | ORAL_TABLET | Freq: Every day | ORAL | Status: DC

## 2015-06-22 MED ORDER — OXYCODONE-ACETAMINOPHEN 5-325 MG PO TABS
2.0000 | ORAL_TABLET | Freq: Once | ORAL | Status: AC
  Administered 2015-06-22: 2 via ORAL
  Filled 2015-06-22: qty 2

## 2015-06-22 MED ORDER — OXYCODONE-ACETAMINOPHEN 5-325 MG PO TABS: 2.0000 | ORAL_TABLET | ORAL | Status: DC | PRN

## 2015-06-22 NOTE — Discharge Instructions (Signed)
Heel Spur A heel spur is a hook of bone that can form on the calcaneus (the heel bone and the largest bone of the foot). Heel spurs are often associated with plantar fasciitis and usually come in people who have had the problem for an extended period of time. The cause of the relationship is unknown. The pain associated with them is thought to be caused by an inflammation (soreness and redness) of the plantar fascia rather than the spur itself. The plantar fascia is a thick fibrous like tissue that runs from the calcaneus (heel bone) to the ball of the foot. This strong, tight tissue helps maintain the arch of your foot. It helps distribute the weight across your foot as you walk or run. Stresses placed on the plantar fascia can be tremendous. When it is inflamed normal activities become painful. Pain is worse in the morning after sleeping. After sleeping the plantar fascia is tight. The first movements stretch the fascia and this causes pain. As the tendon loosens, the pain usually gets better. It often returns with too much standing or walking.  About 70% of patients with plantar fasciitis have a heel spur. About half of people without foot pain also have heel spurs. DIAGNOSIS  The diagnosis of a heel spur is made by X-ray. The X-ray shows a hook of bone protruding from the bottom of the calcaneus at the point where the plantar fascia is attached to the heel bone.  TREATMENT  It is necessary to find out what is causing the stretching of the plantar fascia. If the cause is over-pronation (flat feet), orthotics and proper foot ware may help.  Stretching exercises, losing weight, wearing shoes that have a cushioned heel that absorbs shock, and elevating the heel with the use of a heel cradle, heel cup, or orthotics may all help. Heel cradles and heel cups provide extra comfort and cushion to the heel, and reduce the amount of shock to the sore area. AVOIDING THE PAIN OF PLANTAR FASCIITIS AND HEEL  SPURS  Consult a sports medicine professional before beginning a new exercise program.  Walking programs offer a good workout. There is a lower chance of overuse injuries common to the runners. There is less impact and less jarring of the joints.  Begin all new exercise programs slowly. If problems or pains develop, decrease the amount of time or distance until you are at a comfortable level.  Wear good shoes and replace them regularly.  Stretch your foot and the heel cords at the back of the ankle (Achilles tendons) both before and after exercise.  Run or exercise on even surfaces that are not hard. For example, asphalt is better than pavement.  Do not run barefoot on hard surfaces.  If using a treadmill, vary the incline.  Do not continue to workout if you have foot or joint problems. Seek professional help if they do not improve. HOME CARE INSTRUCTIONS   Avoid activities that cause you pain until you recover.  Use ice or cold packs to the problem or painful areas after working out.  Only take over-the-counter or prescription medicines for pain, discomfort, or fever as directed by your caregiver.  Soft shoe inserts or athletic shoes with air or gel sole cushions may be helpful.  If problems continue or become more severe, consult a sports medicine caregiver. Cortisone is a potent anti-inflammatory medication that may be injected into the painful area. You can discuss this treatment with your caregiver. MAKE SURE YOU:  Understand these instructions.  Will watch your condition.  Will get help right away if you are not doing well or get worse. Document Released: 11/21/2005 Document Revised: 01/07/2012 Document Reviewed: 12/16/2013 [facility] Patient Information 2015 [place], [place]. This information is not intended to replace advice given to you by your health care provider. Make sure you discuss any questions you have with your health care provider.  Plantar Fasciitis Plantar  fasciitis is a common condition that causes foot pain. It is soreness (inflammation) of the band of tough fibrous tissue on the bottom of the foot that runs from the heel bone (calcaneus) to the ball of the foot. The cause of this soreness may be from excessive standing, poor fitting shoes, running on hard surfaces, being overweight, having an abnormal walk, or overuse (this is common in runners) of the painful foot or feet. It is also common in aerobic exercise dancers and ballet dancers. SYMPTOMS  Most people with plantar fasciitis complain of:  Severe pain in the morning on the bottom of their foot especially when taking the first steps out of bed. This pain recedes after a few minutes of walking.  Severe pain is experienced also during walking following a long period of inactivity.  Pain is worse when walking barefoot or up stairs DIAGNOSIS   Your caregiver will diagnose this condition by examining and feeling your foot.  Special tests such as X-rays of your foot, are usually not needed. PREVENTION   Consult a sports medicine professional before beginning a new exercise program.  Walking programs offer a good workout. With walking there is a lower chance of overuse injuries common to runners. There is less impact and less jarring of the joints.  Begin all new exercise programs slowly. If problems or pain develop, decrease the amount of time or distance until you are at a comfortable level.  Wear good shoes and replace them regularly.  Stretch your foot and the heel cords at the back of the ankle (Achilles tendon) both before and after exercise.  Run or exercise on even surfaces that are not hard. For example, asphalt is better than pavement.  Do not run barefoot on hard surfaces.  If using a treadmill, vary the incline.  Do not continue to workout if you have foot or joint problems. Seek professional help if they do not improve. HOME CARE INSTRUCTIONS   Avoid activities that  cause you pain until you recover.  Use ice or cold packs on the problem or painful areas after working out.  Only take over-the-counter or prescription medicines for pain, discomfort, or fever as directed by your caregiver.  Soft shoe inserts or athletic shoes with air or gel sole cushions may be helpful.  If problems continue or become more severe, consult a sports medicine caregiver or your own health care provider. Cortisone is a potent anti-inflammatory medication that may be injected into the painful area. You can discuss this treatment with your caregiver. MAKE SURE YOU:   Understand these instructions.  Will watch your condition.  Will get help right away if you are not doing well or get worse. Document Released: 07/10/2001 Document Revised: 01/07/2012 Document Reviewed: 09/08/2008 [facility] Patient Information 2015 [place], [place]. This information is not intended to replace advice given to you by your health care provider. Make sure you discuss any questions you have with your health care provider.

## 2015-06-22 NOTE — ED Notes (Signed)
Pt verbalized understanding of no driving and to use caution within 4 hours of taking pain meds due to meds cause drowsiness 

## 2015-06-22 NOTE — ED Notes (Signed)
Pt reports left ankle pain and swelling since last night. No deformity noted. Pt denies any known injury.

## 2015-06-23 NOTE — ED Provider Notes (Signed)
CSN: [id number]     Arrival date & time 06/22/15  1420 History   First MD Initiated Contact with Patient 06/22/15 1428     Chief Complaint  Patient presents with  . Ankle Pain     (Consider location/radiation/quality/duration/timing/severity/associated sxs/prior Treatment) Patient is a 64 y.o. female presenting with ankle pain. The history is provided by the patient. No language interpreter was used.  Ankle Pain Location:  Ankle Pain details:    Quality:  Aching   Radiates to:  Does not radiate   Severity:  Moderate   Timing:  Constant   Progression:  Worsening Chronicity:  New Dislocation: no   Foreign body present:  No foreign bodies Tetanus status:  Out of date Relieved by:  Nothing Worsened by:  Nothing tried Ineffective treatments:  None tried Associated symptoms: decreased ROM   Risk factors: recent illness     Past Medical History  Diagnosis Date  . Cellulitis and abscess     abdomen and buttocks  . Renal failure, unspecified   . Unspecified tinnitus     right  . Edema   . Diabetes mellitus   . Essential hypertension   . Headache(784.0)   . Irritable bowel syndrome   . Disorders of phosphorus metabolism   . Palpitations   . Depression   . Osteoarthrosis, unspecified whether generalized or localized, unspecified site   . B12 deficiency   . Pure hypercholesterolemia   . Bronchitis, not specified as acute or chronic   . Reflux esophagitis   . Diabetic neuropathy   . Gout   . Hypopotassemia   . Folliculitis   . Insomnia   . DJD (degenerative joint disease)     right forminal stenosis C4-5  . Hiatal hernia    Past Surgical History  Procedure Laterality Date  . C-spine surgery      03/2007  . Cardiac catheterization    . Total abdominal hysterectomy    . Cholecystectomy    . Right knee arthroscopic surgery    . Colonoscopy  04/21/2011; 05/29/11    6/12: [facility] - ?AVM at IC valve, inflammatory changes at [facility] valve [facility]); 7/12 - [facility];  IC valve erosions, look chronic and probably Crohn's per path  . Esophagogastroduodenoscopy  05/29/11    normal   Family History  Problem Relation Age of Onset  . Diabetes Mother   . Colon cancer Neg Hx    Social History  Substance Use Topics  . Smoking status: Never Smoker   . Smokeless tobacco: Never Used  . Alcohol Use: No   OB History    No data available     Review of Systems  All other systems reviewed and are negative.     Allergies  Amlodipine besylate; Ciprofloxacin; Hydrocodone-acetaminophen; Metformin; Quinolones; Sulfamethoxazole; Sulfonamide derivatives; Valsartan; and Lisinopril  Home Medications   Prior to Admission medications   Medication Sig Start Date End Date Taking? Authorizing Provider  allopurinol (ZYLOPRIM) 100 MG tablet Take 100 mg by mouth daily.    Historical Provider, MD  cholecalciferol (VITAMIN D) 1000 UNITS tablet Take 1,000 Units by mouth daily.      Historical Provider, MD  citalopram (CELEXA) 20 MG tablet Take 20 mg by mouth daily.      Historical Provider, MD  cyanocobalamin (,VITAMIN B-12,) 1000 MCG/ML injection Inject 1,000 mcg into the muscle every 30 (thirty) days.      Historical Provider, MD  esomeprazole (NEXIUM) 40 MG capsule Take 40 mg by mouth daily before  breakfast.      Historical Provider, MD  ezetimibe-simvastatin (VYTORIN) 10-20 MG per tablet Take 1 tablet by mouth at bedtime.    Historical Provider, MD  furosemide (LASIX) 40 MG tablet Take 40 mg by mouth daily.      Historical Provider, MD  indomethacin (INDOCIN) 50 MG capsule Take 50 mg by mouth 3 (three) times daily as needed (pain).     Historical Provider, MD  insulin detemir (LEVEMIR FLEXPEN) 100 UNIT/ML injection Inject 90 Units into the skin at bedtime.     Historical Provider, MD  insulin lispro (HUMALOG KWIKPEN) 100 UNIT/ML SOPN Inject 10-50 Units into the skin 3 (three) times daily with meals.     Historical Provider, MD  losartan (COZAAR) 25 MG tablet Take 25 mg by  mouth daily.    Historical Provider, MD  meloxicam (MOBIC) 15 MG tablet Take 1 tablet (15 mg total) by mouth daily. 06/22/15   [name], PA-C  metoprolol (LOPRESSOR) 100 MG tablet Take 100 mg by mouth 2 (two) times daily.    Historical Provider, MD  oxyCODONE-acetaminophen (PERCOCET/ROXICET) 5-325 MG per tablet Take 2 tablets by mouth every 4 (four) hours as needed for severe pain. 06/22/15   [name], PA-C  potassium chloride SA (K-DUR,KLOR-CON) 20 MEQ tablet Take 20 mEq by mouth once.     Historical Provider, MD  tiZANidine (ZANAFLEX) 4 MG tablet Take 8 mg by mouth at bedtime as needed.     Historical Provider, MD   BP 172/69 mmHg  Pulse 78  Temp(Src) 98.1 F (36.7 C) (Oral)  Resp 16  Ht 5\' 4"  (1.626 m)  Wt 170 lb (77.111 kg)  BMI 29.17 kg/m2  SpO2 98% Physical Exam  Constitutional: She is oriented to person, place, and time. She appears well-developed and well-nourished.  HENT:  Head: Normocephalic.  Eyes: EOM are normal.  Neck: Normal range of motion.  Pulmonary/Chest: Effort normal.  Abdominal: She exhibits no distension.  Musculoskeletal: She exhibits tenderness.  Tender foot and ankle  Neurological: She is alert and oriented to person, place, and time.  Psychiatric: She has a normal mood and affect.  Nursing note and vitals reviewed.   ED Course  Procedures (including critical care time) Labs Review Labs Reviewed - No data to display  Imaging Review Dg Ankle Complete Left  06/22/2015   CLINICAL DATA:  Medial LEFT-sided foot pain and ankle pain for 2 days.  EXAM: LEFT ANKLE COMPLETE - 3+ VIEW  COMPARISON:  01/29/2014  FINDINGS: There is fragmentation inferior to the medial malleolus which is similar to comparison exam. Ankle mortise intact. Talar dome is normal. No calcaneal fracture. Spurring of the calcaneus noted.  IMPRESSION: 1. No acute fracture dislocation. 2. Remote avulsion fracture from the medial malleolus.   Electronically Signed   By: Suzy Bouchard  M.D.   On: 06/22/2015 15:09   Dg Foot Complete Left  06/22/2015   CLINICAL DATA:  Medial LEFT foot and ankle pain for 2 days, fell 2 days ago but caught by husband, unable to bear weight today, history hypertension, diabetes mellitus, gout  EXAM: LEFT FOOT - COMPLETE 3+ VIEW  COMPARISON:  06/17/2010  FINDINGS: Diffuse osseous demineralization.  Slight deformity of LEFT distal fifth metatarsal likely representing old healed fracture, stable.  Joint spaces preserved.  No acute fracture, dislocation, or bone destruction.  Plantar and Achilles insertion calcaneal spurs.  IMPRESSION: No acute osseous abnormalities.   Electronically Signed   By: Crist Infante.D.  On: 06/22/2015 15:08   I have personally reviewed and evaluated these images and lab results as part of my medical decision-making.   EKG Interpretation None      MDM   Final diagnoses:  Plantar fasciitis, left    Ace wrap Post op shoe meloxicam  Pt given nonepic discharge.  Epic down   Experiment, PA-C 06/23/15 1656  Carmin Muskrat, MD 06/26/15 [phone]

## 2015-08-22 ENCOUNTER — Emergency Department (HOSPITAL_COMMUNITY)
Admission: EM | Admit: 2015-08-22 | Discharge: 2015-08-22 | Disposition: A | Discharge status: Home / Self Care | Payer: Medicare Other | Attending: Emergency Medicine | Admitting: Emergency Medicine

## 2015-08-22 ENCOUNTER — Encounter (HOSPITAL_COMMUNITY): Payer: Self-pay | Admitting: *Deleted

## 2015-08-22 ENCOUNTER — Emergency Department (HOSPITAL_COMMUNITY)
Admission: EM | Admit: 2015-08-22 | Discharge: 2015-08-23 | Disposition: A | Discharge status: Home / Self Care | Payer: Medicare Other | Source: Home / Self Care | Attending: Emergency Medicine | Admitting: Emergency Medicine

## 2015-08-22 ENCOUNTER — Emergency Department (HOSPITAL_COMMUNITY): Payer: Medicare Other

## 2015-08-22 DIAGNOSIS — M1990 Unspecified osteoarthritis, unspecified site: Secondary | ICD-10-CM | POA: Insufficient documentation

## 2015-08-22 DIAGNOSIS — R112 Nausea with vomiting, unspecified: Secondary | ICD-10-CM | POA: Insufficient documentation

## 2015-08-22 DIAGNOSIS — R221 Localized swelling, mass and lump, neck: Secondary | ICD-10-CM

## 2015-08-22 DIAGNOSIS — Z791 Long term (current) use of non-steroidal anti-inflammatories (NSAID): Secondary | ICD-10-CM | POA: Diagnosis not present

## 2015-08-22 DIAGNOSIS — E7800 Pure hypercholesterolemia, unspecified: Secondary | ICD-10-CM | POA: Diagnosis not present

## 2015-08-22 DIAGNOSIS — Z872 Personal history of diseases of the skin and subcutaneous tissue: Secondary | ICD-10-CM | POA: Diagnosis not present

## 2015-08-22 DIAGNOSIS — M542 Cervicalgia: Secondary | ICD-10-CM | POA: Diagnosis present

## 2015-08-22 DIAGNOSIS — I10 Essential (primary) hypertension: Secondary | ICD-10-CM | POA: Diagnosis not present

## 2015-08-22 DIAGNOSIS — Z79899 Other long term (current) drug therapy: Secondary | ICD-10-CM | POA: Insufficient documentation

## 2015-08-22 DIAGNOSIS — R51 Headache: Secondary | ICD-10-CM | POA: Diagnosis not present

## 2015-08-22 DIAGNOSIS — Z87448 Personal history of other diseases of urinary system: Secondary | ICD-10-CM | POA: Diagnosis not present

## 2015-08-22 DIAGNOSIS — E538 Deficiency of other specified B group vitamins: Secondary | ICD-10-CM | POA: Insufficient documentation

## 2015-08-22 DIAGNOSIS — E119 Type 2 diabetes mellitus without complications: Secondary | ICD-10-CM | POA: Insufficient documentation

## 2015-08-22 DIAGNOSIS — M109 Gout, unspecified: Secondary | ICD-10-CM | POA: Diagnosis not present

## 2015-08-22 DIAGNOSIS — F329 Major depressive disorder, single episode, unspecified: Secondary | ICD-10-CM | POA: Diagnosis not present

## 2015-08-22 DIAGNOSIS — Z8709 Personal history of other diseases of the respiratory system: Secondary | ICD-10-CM | POA: Diagnosis not present

## 2015-08-22 DIAGNOSIS — Z8669 Personal history of other diseases of the nervous system and sense organs: Secondary | ICD-10-CM | POA: Insufficient documentation

## 2015-08-22 DIAGNOSIS — Z794 Long term (current) use of insulin: Secondary | ICD-10-CM | POA: Diagnosis not present

## 2015-08-22 DIAGNOSIS — H538 Other visual disturbances: Secondary | ICD-10-CM | POA: Diagnosis not present

## 2015-08-22 DIAGNOSIS — K210 Gastro-esophageal reflux disease with esophagitis: Secondary | ICD-10-CM | POA: Diagnosis not present

## 2015-08-22 DIAGNOSIS — Z9889 Other specified postprocedural states: Secondary | ICD-10-CM | POA: Diagnosis not present

## 2015-08-22 MED ORDER — MORPHINE SULFATE (PF) 4 MG/ML IV SOLN
4.0000 mg | Freq: Once | INTRAVENOUS | Status: DC
  Filled 2015-08-22: qty 1

## 2015-08-22 MED ORDER — OXYCODONE-ACETAMINOPHEN 5-325 MG PO TABS
1.0000 | ORAL_TABLET | Freq: Once | ORAL | Status: AC
  Administered 2015-08-22: 1 via ORAL

## 2015-08-22 MED ORDER — ONDANSETRON 8 MG PO TBDP
8.0000 mg | ORAL_TABLET | Freq: Once | ORAL | Status: AC
  Administered 2015-08-22: 8 mg via ORAL
  Filled 2015-08-22: qty 2

## 2015-08-22 MED ORDER — OXYCODONE-ACETAMINOPHEN 5-325 MG PO TABS
ORAL_TABLET | ORAL | Status: AC
  Filled 2015-08-22: qty 1

## 2015-08-22 MED ORDER — HYDROMORPHONE HCL 1 MG/ML IJ SOLN
1.0000 mg | Freq: Once | INTRAMUSCULAR | Status: DC
  Filled 2015-08-22: qty 1

## 2015-08-22 MED ORDER — HYDROMORPHONE HCL 1 MG/ML IJ SOLN
1.0000 mg | Freq: Once | INTRAMUSCULAR | Status: AC
  Administered 2015-08-22: 1 mg via INTRAMUSCULAR
  Filled 2015-08-22: qty 1

## 2015-08-22 MED ORDER — HYDROMORPHONE HCL 1 MG/ML IJ SOLN
1.0000 mg | Freq: Once | INTRAMUSCULAR | Status: AC
  Administered 2015-08-22: 1 mg via INTRAMUSCULAR

## 2015-08-22 NOTE — ED Notes (Signed)
Pt requesting to leave, pt encouraged to stay however pt states she no longer wishes to wait. Pt A&Ox4, no acute distress.

## 2015-08-22 NOTE — ED Notes (Addendum)
C/o left side of face swelling with nausea and pain

## 2015-08-22 NOTE — ED Provider Notes (Signed)
CSN: [id number]     Arrival date & time 08/22/15  2031 History  By signing my name below, I, Heather [name], attest that this documentation has been prepared under the direction and in the presence of [name], MD. Electronically Signed: Meriel [name], ED Scribe. 08/22/2015. 10:48 PM.   Chief Complaint  Patient presents with  . Facial Swelling   The history is provided by the patient. No language interpreter was used.   HPI Comments: Heather [name] is a 64 y.o. female, with a significant PMhx, who presents to the Emergency Department complaining of a sudden onset, constant, worsening area of pain and swelling of soft tissue to left, lateral neck beneath the left ear with associated headache and nausea with emesis. Pt additionally associates visual changes in left eye secondary to the swelling and pain.She endorses a fever of 101F 1 day ago. She notes a PShx to the affected area and admission for IV antibiotics. Pt was seen by her PCP [name] today who prescribed an antibiotic course and advised she may need imaging of the area. She denies any injury or trauma attributable to her complaint, coughing, difficulty breathing or swallowing, choking, or hearing changes. Pt afebrile on triage vitals. No color change to affected area.   Past Medical History  Diagnosis Date  . Cellulitis and abscess     abdomen and buttocks  . Renal failure, unspecified   . Unspecified tinnitus     right  . Edema   . Diabetes mellitus   . Essential hypertension   . Headache(784.0)   . Irritable bowel syndrome   . Disorders of phosphorus metabolism   . Palpitations   . Depression   . Osteoarthrosis, unspecified whether generalized or localized, unspecified site   . B12 deficiency   . Pure hypercholesterolemia   . Bronchitis, not specified as acute or chronic   . Reflux esophagitis   . Diabetic neuropathy ([place])   . Gout   . Hypopotassemia   . Folliculitis   . Insomnia   . DJD (degenerative  joint disease)     right forminal stenosis C4-5  . Hiatal hernia    Past Surgical History  Procedure Laterality Date  . C-spine surgery      03/2007  . Cardiac catheterization    . Total abdominal hysterectomy    . Cholecystectomy    . Right knee arthroscopic surgery    . Colonoscopy  04/21/2011; 05/29/11    6/12: [facility] - ?AVM at IC valve, inflammatory changes at [facility] valve [facility]); 7/12 - [facility]; IC valve erosions, look chronic and probably Crohn's per path  . Esophagogastroduodenoscopy  05/29/11    normal  . Joint replacement     Family History  Problem Relation Age of Onset  . Diabetes Mother   . Colon cancer Neg Hx    Social History  Substance Use Topics  . Smoking status: Never Smoker   . Smokeless tobacco: Never Used  . Alcohol Use: No   OB History    No data available     Review of Systems  Constitutional: Negative for fever.  HENT: Positive for facial swelling ( left, lateral neck ). Negative for hearing loss and trouble swallowing.   Eyes: Positive for visual disturbance ( left eye).  Respiratory: Negative for cough, choking, shortness of breath and stridor.   Gastrointestinal: Positive for nausea and vomiting. Negative for abdominal pain.  Musculoskeletal: Positive for neck pain ( to area of swelling ).  Skin: Negative  for color change.  Neurological: Positive for headaches.  All other systems reviewed and are negative.  Allergies  Amlodipine besylate; Ciprofloxacin; Hydrocodone-acetaminophen; Metformin; Quinolones; Sulfamethoxazole; Sulfonamide derivatives; Valsartan; and Lisinopril  Home Medications   Prior to Admission medications   Medication Sig Start Date End Date Taking? Authorizing Provider  allopurinol (ZYLOPRIM) 100 MG tablet Take 100 mg by mouth daily.    Historical Provider, MD  cholecalciferol (VITAMIN D) 1000 UNITS tablet Take 1,000 Units by mouth daily.      Historical Provider, MD  citalopram (CELEXA) 20 MG tablet Take 20 mg by  mouth daily.      Historical Provider, MD  cyanocobalamin (,VITAMIN B-12,) 1000 MCG/ML injection Inject 1,000 mcg into the muscle every 30 (thirty) days.      Historical Provider, MD  esomeprazole (NEXIUM) 40 MG capsule Take 40 mg by mouth daily before breakfast.      Historical Provider, MD  ezetimibe-simvastatin (VYTORIN) 10-20 MG per tablet Take 1 tablet by mouth at bedtime.    Historical Provider, MD  furosemide (LASIX) 40 MG tablet Take 40 mg by mouth daily.      Historical Provider, MD  indomethacin (INDOCIN) 50 MG capsule Take 50 mg by mouth 3 (three) times daily as needed (pain).     Historical Provider, MD  insulin detemir (LEVEMIR FLEXPEN) 100 UNIT/ML injection Inject 90 Units into the skin at bedtime.     Historical Provider, MD  insulin lispro (HUMALOG KWIKPEN) 100 UNIT/ML SOPN Inject 10-50 Units into the skin 3 (three) times daily with meals.     Historical Provider, MD  losartan (COZAAR) 25 MG tablet Take 25 mg by mouth daily.    Historical Provider, MD  meloxicam (MOBIC) 15 MG tablet Take 1 tablet (15 mg total) by mouth daily. 06/22/15   [name], PA-C  metoprolol (LOPRESSOR) 100 MG tablet Take 100 mg by mouth 2 (two) times daily.    Historical Provider, MD  oxyCODONE-acetaminophen (PERCOCET/ROXICET) 5-325 MG per tablet Take 2 tablets by mouth every 4 (four) hours as needed for severe pain. 06/22/15   [name], PA-C  potassium chloride SA (K-DUR,KLOR-CON) 20 MEQ tablet Take 20 mEq by mouth once.     Historical Provider, MD  tiZANidine (ZANAFLEX) 4 MG tablet Take 8 mg by mouth at bedtime as needed.     Historical Provider, MD   BP 203/66 mmHg  Pulse 73  Temp(Src) 97.8 F (36.6 C) (Oral)  Resp 16  Ht 5\' 4"  (1.626 m)  Wt 175 lb (79.379 kg)  BMI 30.02 kg/m2  SpO2 100% Physical Exam CONSTITUTIONAL: Well developed/well nourished HEAD: Normocephalic/atraumatic EYES: EOMI/PERRL, no proptosis.   ENMT: Mucous membranes moist, soft tissue swelling to left neck without  erythema or crepitus, no firm masses noted, no stridor, no trismus NECK: supple no meningeal signs SPINE/BACK:entire spine nontender CV: S1/S2 noted, no murmurs/rubs/gallops noted LUNGS: Lungs are clear to auscultation bilaterally, no apparent distress ABDOMEN: soft, nontender, no rebound or guarding, bowel sounds noted throughout abdomen NEURO: Pt is awake/alert/appropriate, moves all extremitiesx4.  No facial droop.   EXTREMITIES: pulses normal/equal, full ROM SKIN: warm, color normal PSYCH: no abnormalities of mood noted, alert and oriented to situation  ED Course  Procedures  DIAGNOSTIC STUDIES: Oxygen Saturation is 100% on RA, normal by my interpretation.    COORDINATION OF CARE: 9:31 PM Discussed treatment plan with pt at bedside and pt agreed to plan. Will order pain management medication and CT of the soft tissue of the neck.  11:25 PM Pt with difficult IV access and she does not want any further attempts She also reports h/o renal insufficiency Will proceed with CT imaging without IV contrast D/w dr ward at signout to f/u on CT imaging   MDM   Final diagnoses:  Neck swelling    Nursing notes including past medical history and social history reviewed and considered in documentation  I personally performed the services described in this documentation, which was scribed in my presence. The recorded information has been reviewed and is accurate.         [name], MD 08/22/15 2325

## 2015-08-22 NOTE — ED Provider Notes (Signed)
12:00 AM  Assumed care from Dr. Christy Gentles.  Pt is a 64 y.o. female with history of hypertension, diabetes, previous cervical spine surgery in 2008 was performed anteriorly who presents to the emergency department with swelling over the left preauricular area down into the left lateral neck. States this is been present for 2 weeks with associated pain. No erythema or warmth. She states she has had fevers of 101. Was seen by her PCP and started on doxycycline and was advised to come to the emergency department for imaging. States she's had this happen to her in the past but is unclear on the details. Patient had a CT scan without contrast performed the emergency department. Peripheral IV was attempted multiple times and patient refused further IV sticks. CT scan shows no definite acute abnormality. There is a focal area of skin thickening and architectural distortion of the underlying fat in the upper left cervical region which may be secondary to prior cervical fixation and representing an old operative wound. There is no sign of soft tissue abscess or mass. There are some reactive lymph nodes noted. On exam she has no facial erythema, warmth and swelling is minimal at best and difficult to appreciate given patient has a large amount of fat tissue in her face and neck. No sign of dental abscess on exam and patient has had her left lower molars removed. She has no trismus or drooling. Normal phonation. No stridor. No sign of Ludwig's angina. Tongue sits flat in the bottom of the mouth. No tonsillar hypertrophy or exudate and no uvular deviation. No tracheal deviation. She has been afebrile in the emergency department and hemodynamically stable. Was initially hypertensive but this improved with pain medication. Have advised her to follow-up with her doctor for further management as well as her dentist. She is on doxycycline and I have asked her to hold this medication and we will broaden her with clindamycin in case  there is any dental component to this swelling. We'll also provide Percocet for pain. Discussed return precautions. I feel she is safe to be discharged home without further emergent workup. She verbalized understanding and patient and family at bedside are comfortable with this plan.  [place], DO 08/23/15 0003

## 2015-08-22 NOTE — ED Notes (Signed)
PT states just left MD in [place] and has had a tumor removed on left side of face near ear.  Pt now has the same symptoms again of pain and swelling to the area, headaches and nausea.  Pt states swelling started last Wednesday.

## 2015-08-22 NOTE — ED Notes (Signed)
Pt c/o swelling to left side of face below ear; pt states she has had a tumor removed from same area a few years ago; pt states her PCP put her on antibiotics to try and reduce the swelling, but pt states they are not helping and now she is having pain when she chews on the left side of her mouth

## 2015-08-23 DIAGNOSIS — R221 Localized swelling, mass and lump, neck: Secondary | ICD-10-CM | POA: Diagnosis not present

## 2015-08-23 MED ORDER — CLINDAMYCIN HCL 300 MG PO CAPS: 300.0000 mg | ORAL_CAPSULE | Freq: Three times a day (TID) | ORAL | Status: DC

## 2015-08-23 MED ORDER — OXYCODONE-ACETAMINOPHEN 5-325 MG PO TABS: 1.0000 | ORAL_TABLET | ORAL | Status: DC | PRN

## 2015-08-23 NOTE — ED Notes (Signed)
Patient verbalizes understanding of discharge instructions, prescription medications, home care and follow up care. Patient ambulatory out of department at this time with family. 

## 2015-10-30 HISTORY — PX: OTHER SURGICAL HISTORY: SHX169

## 2016-01-06 DIAGNOSIS — E875 Hyperkalemia: Secondary | ICD-10-CM | POA: Insufficient documentation

## 2016-01-20 ENCOUNTER — Encounter: Payer: Self-pay | Admitting: Internal Medicine

## 2016-01-20 DIAGNOSIS — K920 Hematemesis: Secondary | ICD-10-CM | POA: Insufficient documentation

## 2016-01-21 ENCOUNTER — Inpatient Hospital Stay (HOSPITAL_COMMUNITY)
Admission: AD | Admit: 2016-01-21 | Discharge: 2016-01-25 | DRG: 378 | Disposition: A | Discharge status: Home Health | Payer: Medicare Other | Source: Other Acute Inpatient Hospital | Attending: Internal Medicine | Admitting: Internal Medicine

## 2016-01-21 ENCOUNTER — Encounter (HOSPITAL_COMMUNITY): Payer: Self-pay | Admitting: *Deleted

## 2016-01-21 ENCOUNTER — Encounter (HOSPITAL_COMMUNITY)
Admission: AD | Disposition: A | Discharge status: Home Health | Payer: Self-pay | Source: Other Acute Inpatient Hospital | Attending: Internal Medicine

## 2016-01-21 DIAGNOSIS — N183 Chronic kidney disease, stage 3 unspecified: Secondary | ICD-10-CM

## 2016-01-21 DIAGNOSIS — T380X5A Adverse effect of glucocorticoids and synthetic analogues, initial encounter: Secondary | ICD-10-CM | POA: Diagnosis present

## 2016-01-21 DIAGNOSIS — K210 Gastro-esophageal reflux disease with esophagitis: Secondary | ICD-10-CM | POA: Diagnosis present

## 2016-01-21 DIAGNOSIS — E119 Type 2 diabetes mellitus without complications: Secondary | ICD-10-CM | POA: Diagnosis not present

## 2016-01-21 DIAGNOSIS — E1140 Type 2 diabetes mellitus with diabetic neuropathy, unspecified: Secondary | ICD-10-CM | POA: Diagnosis present

## 2016-01-21 DIAGNOSIS — N179 Acute kidney failure, unspecified: Secondary | ICD-10-CM | POA: Diagnosis present

## 2016-01-21 DIAGNOSIS — M109 Gout, unspecified: Secondary | ICD-10-CM | POA: Diagnosis present

## 2016-01-21 DIAGNOSIS — I129 Hypertensive chronic kidney disease with stage 1 through stage 4 chronic kidney disease, or unspecified chronic kidney disease: Secondary | ICD-10-CM | POA: Diagnosis present

## 2016-01-21 DIAGNOSIS — N189 Chronic kidney disease, unspecified: Secondary | ICD-10-CM | POA: Diagnosis present

## 2016-01-21 DIAGNOSIS — E1165 Type 2 diabetes mellitus with hyperglycemia: Secondary | ICD-10-CM

## 2016-01-21 DIAGNOSIS — K920 Hematemesis: Secondary | ICD-10-CM | POA: Diagnosis present

## 2016-01-21 DIAGNOSIS — Z833 Family history of diabetes mellitus: Secondary | ICD-10-CM

## 2016-01-21 DIAGNOSIS — K254 Chronic or unspecified gastric ulcer with hemorrhage: Secondary | ICD-10-CM | POA: Diagnosis present

## 2016-01-21 DIAGNOSIS — Z90710 Acquired absence of both cervix and uterus: Secondary | ICD-10-CM | POA: Diagnosis not present

## 2016-01-21 DIAGNOSIS — E7800 Pure hypercholesterolemia, unspecified: Secondary | ICD-10-CM | POA: Diagnosis present

## 2016-01-21 DIAGNOSIS — K589 Irritable bowel syndrome without diarrhea: Secondary | ICD-10-CM | POA: Diagnosis present

## 2016-01-21 DIAGNOSIS — Z794 Long term (current) use of insulin: Secondary | ICD-10-CM

## 2016-01-21 DIAGNOSIS — G43A0 Cyclical vomiting, not intractable: Secondary | ICD-10-CM

## 2016-01-21 DIAGNOSIS — Z6829 Body mass index (BMI) 29.0-29.9, adult: Secondary | ICD-10-CM

## 2016-01-21 DIAGNOSIS — E669 Obesity, unspecified: Secondary | ICD-10-CM | POA: Diagnosis present

## 2016-01-21 DIAGNOSIS — E1121 Type 2 diabetes mellitus with diabetic nephropathy: Secondary | ICD-10-CM

## 2016-01-21 DIAGNOSIS — I10 Essential (primary) hypertension: Secondary | ICD-10-CM | POA: Diagnosis present

## 2016-01-21 DIAGNOSIS — R112 Nausea with vomiting, unspecified: Secondary | ICD-10-CM | POA: Diagnosis present

## 2016-01-21 DIAGNOSIS — K922 Gastrointestinal hemorrhage, unspecified: Secondary | ICD-10-CM | POA: Diagnosis present

## 2016-01-21 DIAGNOSIS — E1122 Type 2 diabetes mellitus with diabetic chronic kidney disease: Secondary | ICD-10-CM | POA: Diagnosis present

## 2016-01-21 DIAGNOSIS — R110 Nausea: Secondary | ICD-10-CM

## 2016-01-21 DIAGNOSIS — K921 Melena: Secondary | ICD-10-CM | POA: Diagnosis present

## 2016-01-21 HISTORY — PX: ESOPHAGOGASTRODUODENOSCOPY: SHX5428

## 2016-01-21 HISTORY — DX: Chronic kidney disease, stage 3 unspecified: N18.30

## 2016-01-21 HISTORY — DX: Acute kidney failure, unspecified: N17.9

## 2016-01-21 LAB — CBC
HCT: 26.7 % — ABNORMAL LOW (ref 36.0–46.0)
HEMOGLOBIN: 8.7 g/dL — AB (ref 12.0–15.0)
MCH: 29.1 pg (ref 26.0–34.0)
MCHC: 32.6 g/dL (ref 30.0–36.0)
MCV: 89.3 fL (ref 78.0–100.0)
Platelets: 223 10*3/uL (ref 150–400)
RBC: 2.99 MIL/uL — ABNORMAL LOW (ref 3.87–5.11)
RDW: 15.6 % — ABNORMAL HIGH (ref 11.5–15.5)
WBC: 10.8 10*3/uL — ABNORMAL HIGH (ref 4.0–10.5)

## 2016-01-21 LAB — GLUCOSE, CAPILLARY
GLUCOSE-CAPILLARY: 125 mg/dL — AB (ref 65–99)
GLUCOSE-CAPILLARY: 111 mg/dL — AB (ref 65–99)
GLUCOSE-CAPILLARY: 125 mg/dL — AB (ref 65–99)
Glucose-Capillary: 98 mg/dL (ref 65–99)
Glucose-Capillary: 120 mg/dL — ABNORMAL HIGH (ref 65–99)
Glucose-Capillary: 114 mg/dL — ABNORMAL HIGH (ref 65–99)

## 2016-01-21 LAB — COMPREHENSIVE METABOLIC PANEL
ALK PHOS: 68 U/L (ref 38–126)
ALT: 17 U/L (ref 14–54)
ANION GAP: 18 — AB (ref 5–15)
AST: 30 U/L (ref 15–41)
Albumin: 2.9 g/dL — ABNORMAL LOW (ref 3.5–5.0)
BILIRUBIN TOTAL: 0.9 mg/dL (ref 0.3–1.2)
BUN: 61 mg/dL — ABNORMAL HIGH (ref 6–20)
CALCIUM: 6.8 mg/dL — AB (ref 8.9–10.3)
CO2: 21 mmol/L — AB (ref 22–32)
Chloride: 100 mmol/L — ABNORMAL LOW (ref 101–111)
Creatinine, Ser: 5.86 mg/dL — ABNORMAL HIGH (ref 0.44–1.00)
GFR, EST AFRICAN AMERICAN: 8 mL/min — AB (ref >60)
GFR, EST NON AFRICAN AMERICAN: 7 mL/min — AB (ref >60)
Glucose, Bld: 125 mg/dL — ABNORMAL HIGH (ref 65–99)
Potassium: 3.6 mmol/L (ref 3.5–5.1)
SODIUM: 139 mmol/L (ref 135–145)
TOTAL PROTEIN: 5.6 g/dL — AB (ref 6.5–8.1)

## 2016-01-21 LAB — APTT: APTT: 33 s (ref 24–37)

## 2016-01-21 LAB — PROTIME-INR
INR: 1.57 — AB (ref 0.00–1.49)
PROTHROMBIN TIME: 18.8 s — AB (ref 11.6–15.2)

## 2016-01-21 LAB — MRSA PCR SCREENING: MRSA by PCR: NEGATIVE

## 2016-01-21 SURGERY — EGD (ESOPHAGOGASTRODUODENOSCOPY)
Anesthesia: Moderate Sedation

## 2016-01-21 MED ORDER — BUTAMBEN-TETRACAINE-BENZOCAINE 2-2-14 % EX AERO
INHALATION_SPRAY | CUTANEOUS | Status: DC | PRN
  Administered 2016-01-21: 2 via TOPICAL

## 2016-01-21 MED ORDER — COLCHICINE 0.6 MG PO TABS
0.6000 mg | ORAL_TABLET | Freq: Once | ORAL | Status: DC
  Filled 2016-01-21 (×2): qty 1

## 2016-01-21 MED ORDER — MIDAZOLAM HCL 10 MG/2ML IJ SOLN
INTRAMUSCULAR | Status: DC | PRN
  Administered 2016-01-21: 2 mg via INTRAVENOUS

## 2016-01-21 MED ORDER — INSULIN ASPART 100 UNIT/ML ~~LOC~~ SOLN
0.0000 [IU] | SUBCUTANEOUS | Status: DC
  Administered 2016-01-22: 3 [IU] via SUBCUTANEOUS
  Administered 2016-01-22 (×4): 2 [IU] via SUBCUTANEOUS

## 2016-01-21 MED ORDER — METOPROLOL TARTRATE 100 MG PO TABS
100.0000 mg | ORAL_TABLET | Freq: Two times a day (BID) | ORAL | Status: DC
  Administered 2016-01-22 – 2016-01-24 (×5): 100 mg via ORAL
  Filled 2016-01-21 (×7): qty 1

## 2016-01-21 MED ORDER — FENTANYL CITRATE (PF) 100 MCG/2ML IJ SOLN
INTRAMUSCULAR | Status: DC | PRN
  Administered 2016-01-21: 25 ug via INTRAVENOUS

## 2016-01-21 MED ORDER — PANTOPRAZOLE SODIUM 40 MG IV SOLR: 40.0000 mg | Freq: Two times a day (BID) | INTRAVENOUS | Status: DC

## 2016-01-21 MED ORDER — OXYCODONE-ACETAMINOPHEN 5-325 MG PO TABS
1.0000 | ORAL_TABLET | ORAL | Status: DC | PRN
  Administered 2016-01-21: 2 via ORAL
  Administered 2016-01-21 – 2016-01-22 (×3): 1 via ORAL
  Administered 2016-01-23 (×2): 2 via ORAL
  Filled 2016-01-21 (×4): qty 1
  Filled 2016-01-21: qty 2
  Filled 2016-01-21: qty 1
  Filled 2016-01-21: qty 2

## 2016-01-21 MED ORDER — INSULIN DETEMIR 100 UNIT/ML ~~LOC~~ SOLN
45.0000 [IU] | Freq: Every day | SUBCUTANEOUS | Status: DC
  Filled 2016-01-21: qty 0.45

## 2016-01-21 MED ORDER — DIPHENHYDRAMINE HCL 50 MG/ML IJ SOLN
INTRAMUSCULAR | Status: DC | PRN
  Administered 2016-01-21: 25 mg via INTRAVENOUS

## 2016-01-21 MED ORDER — FENTANYL CITRATE (PF) 100 MCG/2ML IJ SOLN
INTRAMUSCULAR | Status: AC
  Filled 2016-01-21: qty 2

## 2016-01-21 MED ORDER — MIDAZOLAM HCL 5 MG/ML IJ SOLN
INTRAMUSCULAR | Status: AC
  Filled 2016-01-21: qty 2

## 2016-01-21 MED ORDER — ONDANSETRON HCL 4 MG/2ML IJ SOLN
4.0000 mg | Freq: Four times a day (QID) | INTRAMUSCULAR | Status: DC | PRN
  Administered 2016-01-21 – 2016-01-23 (×2): 4 mg via INTRAVENOUS
  Filled 2016-01-21: qty 2

## 2016-01-21 MED ORDER — CETYLPYRIDINIUM CHLORIDE 0.05 % MT LIQD
7.0000 mL | Freq: Two times a day (BID) | OROMUCOSAL | Status: DC
  Administered 2016-01-21 – 2016-01-25 (×7): 7 mL via OROMUCOSAL

## 2016-01-21 MED ORDER — ALLOPURINOL 100 MG PO TABS
100.0000 mg | ORAL_TABLET | Freq: Every day | ORAL | Status: DC
  Administered 2016-01-21 – 2016-01-25 (×5): 100 mg via ORAL
  Filled 2016-01-21 (×5): qty 1

## 2016-01-21 MED ORDER — EZETIMIBE-SIMVASTATIN 10-20 MG PO TABS
1.0000 | ORAL_TABLET | Freq: Every day | ORAL | Status: DC
  Administered 2016-01-21 – 2016-01-22 (×2): 1 via ORAL
  Filled 2016-01-21 (×9): qty 1

## 2016-01-21 MED ORDER — DIPHENHYDRAMINE HCL 50 MG/ML IJ SOLN
INTRAMUSCULAR | Status: AC
  Filled 2016-01-21: qty 1

## 2016-01-21 MED ORDER — INSULIN DETEMIR 100 UNIT/ML ~~LOC~~ SOLN
30.0000 [IU] | Freq: Every day | SUBCUTANEOUS | Status: DC
  Administered 2016-01-22: 30 [IU] via SUBCUTANEOUS
  Filled 2016-01-21 (×3): qty 0.3

## 2016-01-21 MED ORDER — PANTOPRAZOLE SODIUM 40 MG IV SOLR
8.0000 mg/h | INTRAVENOUS | Status: DC
  Administered 2016-01-21 – 2016-01-23 (×6): 8 mg/h via INTRAVENOUS
  Filled 2016-01-21 (×12): qty 80

## 2016-01-21 MED ORDER — SODIUM CHLORIDE 0.9 % IV SOLN
80.0000 mg | Freq: Once | INTRAVENOUS | Status: AC
  Administered 2016-01-21: 80 mg via INTRAVENOUS
  Filled 2016-01-21: qty 80

## 2016-01-21 MED ORDER — TIZANIDINE HCL 4 MG PO TABS
8.0000 mg | ORAL_TABLET | Freq: Every day | ORAL | Status: DC
  Administered 2016-01-21 – 2016-01-23 (×2): 8 mg via ORAL
  Filled 2016-01-21 (×2): qty 2

## 2016-01-21 MED ORDER — OXYCODONE-ACETAMINOPHEN 5-325 MG PO TABS
1.0000 | ORAL_TABLET | ORAL | Status: DC | PRN
Start: 2016-01-21 — End: 2016-01-21
  Administered 2016-01-21 (×2): 1 via ORAL
  Filled 2016-01-21 (×2): qty 1

## 2016-01-21 MED ORDER — CITALOPRAM HYDROBROMIDE 40 MG PO TABS
40.0000 mg | ORAL_TABLET | Freq: Every day | ORAL | Status: DC
  Administered 2016-01-21 – 2016-01-24 (×4): 40 mg via ORAL
  Filled 2016-01-21 (×4): qty 1

## 2016-01-21 MED ORDER — SODIUM CHLORIDE 0.9 % IV SOLN
INTRAVENOUS | Status: DC
  Administered 2016-01-21 – 2016-01-23 (×6): via INTRAVENOUS

## 2016-01-21 NOTE — Progress Notes (Signed)
Patient seen and evaluated earlier the same by my associate. Please refer to H&P for details regarding assessment and plan. Plans are for GI to evaluate given principle problem. The patient's main concern this morning is her pain medication.  We'll plan on reassessing next a.m. or sooner should any new medical conditions arise. Will increase current Percocet regimen to 1-2 tabs every 4 hours as needed for pain.  Gen: pt in nad, alert and awake CV: s1 and s2 wnl, no rubs Pulm: diffused exp wheezes BL, + rhales, equal chest rise.  Velvet Bathe  Will plan on reassessing next am.

## 2016-01-21 NOTE — Op Note (Addendum)
[facility] Patient Name: Heather [name] Procedure Date : 01/21/2016 MRN: [id number] Attending MD: Lear Ng , MD Date of Birth: 15-Jul-1951 CSN: [id number] Age: 65 Admit Type: Inpatient Procedure:                Upper GI endoscopy Indications:              Coffee-ground emesis, Melena Providers:                Lear Ng, MD, Elna Breslow, RN, Alfonso Patten, Technician Referring MD:              Medicines:                Cetacaine spray, Fentanyl 25 micrograms IV,                            Midazolam 2 mg IV, Diphenhydramine 25 mg IV Complications:            No immediate complications. Estimated Blood Loss:     Estimated blood loss: none. Procedure:                Pre-Anesthesia Assessment:                           - Prior to the procedure, a History and Physical                            was performed, and patient medications and                            allergies were reviewed. The patient's tolerance of                            previous anesthesia was also reviewed. The risks                            and benefits of the procedure and the sedation                            options and risks were discussed with the patient.                            All questions were answered, and informed consent                            was obtained. Prior Anticoagulants: The patient has                            taken no previous anticoagulant or antiplatelet                            agents. ASA Grade Assessment: IV - A patient with  severe systemic disease that is a constant threat                            to life. After reviewing the risks and benefits,                            the patient was deemed in satisfactory condition to                            undergo the procedure.                           After obtaining informed consent, the endoscope was                            passed  under direct vision. Throughout the                            procedure, the patient's blood pressure, pulse, and                            oxygen saturations were monitored continuously. The                            [id number]) scope was introduced through the                            mouth, and advanced to the second part of duodenum.                            The upper GI endoscopy was accomplished without                            difficulty. The patient tolerated the procedure                            well. Scope In: Scope Out: Findings:      The oropharynx was normal.      LA Grade A (one or more mucosal breaks less than 5 mm, not extending       between tops of 2 mucosal folds) esophagitis with no bleeding was found       at the gastroesophageal junction.      The Z-line was found 38 cm from the incisors.      One oozing superficial gastric ulcer with no stigmata of bleeding was       found in the prepyloric region of the stomach. The lesion was 6 mm in       largest dimension.      Segmental mild mucosal changes characterized by congestion and erythema       were found in the gastric antrum.      The cardia and gastric fundus were normal on retroflexion.      The examined duodenum was normal. Impression:               - Normal oropharynx.                           -  LA Grade A reflux esophagitis.                           - Z-line, 38 cm from the incisors.                           - Oozing gastric ulcer with no stigmata of bleeding.                           - Congested and erythematous mucosa in the antrum.                           - Normal examined duodenum.                           - No specimens collected. Moderate Sedation:      Moderate (conscious) sedation was administered by the endoscopy nurse       and supervised by the endoscopist. The following parameters were       monitored: oxygen saturation, heart rate, blood pressure, and response        to care. Recommendation:           - Give Protonix (pantoprazole): 8 mg/hr IV by                            continuous infusion.                           - Perform an H. pylori serology.                           - Sips of water, ice chips.                           - Continue present medications.                           - Post procedure medication orders were given. Procedure Code(s):        --- Professional ---                           [phone], Esophagogastroduodenoscopy, flexible,                            transoral; diagnostic, including collection of                            specimen(s) by brushing or washing, when performed                            (separate procedure) Diagnosis Code(s):        --- Professional ---                           K92.0, Hematemesis                           K92.1,  Melena (includes Hematochezia)                           K25.4, Chronic or unspecified gastric ulcer with                            hemorrhage                           K21.0, Gastro-esophageal reflux disease with                            esophagitis                           K31.89, Other diseases of stomach and duodenum CPT copyright 2016 American Medical Association. All rights reserved. The codes documented in this report are preliminary and upon coder review may  be revised to meet current compliance requirements. Wilford Corner, MD Lear Ng, MD 01/21/2016 12:48:48 PM This report has been signed electronically. Number of Addenda: 1 Addendum Number: 1   Addendum Date: 01/21/2016 12:50:06 PM      ASA for patient is III. Wilford Corner, MD Lear Ng, MD 01/21/2016 12:50:43 PM This report has been signed electronically.

## 2016-01-21 NOTE — H&P (View-Only) (Signed)
Referring Provider: Dr. Alcario Drought Primary Care Physician:  [name], MD Primary Gastroenterologist:  Althia Forts  Reason for Consultation:  Melena  HPI: Heather [name] is a 65 y.o. female with multiple medical problems transferred from [facility] due to a GI bleed and lack of GI coverage there. Report of black stools and coffee grounds emesis that she does not recall. Reports constant upper abdominal pain that is dull and crampy. Daily heartburn that is relieved with Nexium. Reports history of a hiatal hernia. 20 pound unintentional weight loss in the past month. No history of hematochezia or hematemesis. Thinks she had a colonoscopy 2 years ago in [place] but does not know the results. Chart review shows a colonoscopy by Dr. Carlean Purl in 2012 that showed ileal erosions possibly from NSAIDs. Reports having neck surgery 2 weeks ago and has severe pain in her back when she lies flat. History of recurrent vomiting in the setting of diabetes. Hgb 8.7.   Past Medical History  Diagnosis Date  . Cellulitis and abscess     abdomen and buttocks  . Renal failure, unspecified   . Unspecified tinnitus     right  . Edema   . Diabetes mellitus   . Essential hypertension   . Headache(784.0)   . Irritable bowel syndrome   . Disorders of phosphorus metabolism   . Palpitations   . Depression   . Osteoarthrosis, unspecified whether generalized or localized, unspecified site   . B12 deficiency   . Pure hypercholesterolemia   . Bronchitis, not specified as acute or chronic   . Reflux esophagitis   . Diabetic neuropathy ([place])   . Gout   . Hypopotassemia   . Folliculitis   . Insomnia   . DJD (degenerative joint disease)     right forminal stenosis C4-5  . Hiatal hernia     Past Surgical History  Procedure Laterality Date  . C-spine surgery      03/2007  . Cardiac catheterization    . Total abdominal hysterectomy    . Cholecystectomy    . Right knee arthroscopic surgery    .  Colonoscopy  04/21/2011; 05/29/11    6/12: [facility] - ?AVM at IC valve, inflammatory changes at [facility] valve [facility]); 7/12 - [facility]; IC valve erosions, look chronic and probably Crohn's per path  . Esophagogastroduodenoscopy  05/29/11    normal  . Joint replacement      Prior to Admission medications   Medication Sig Start Date End Date Taking? Authorizing Provider  allopurinol (ZYLOPRIM) 100 MG tablet Take 100 mg by mouth daily.    Historical Provider, MD  cholecalciferol (VITAMIN D) 1000 UNITS tablet Take 1,000 Units by mouth daily.      Historical Provider, MD  citalopram (CELEXA) 40 MG tablet Take 40 mg by mouth daily. 08/05/15   Historical Provider, MD  cyanocobalamin (,VITAMIN B-12,) 1000 MCG/ML injection Inject 1,000 mcg into the muscle every 30 (thirty) days.      Historical Provider, MD  esomeprazole (NEXIUM) 40 MG capsule Take 40 mg by mouth daily before breakfast.      Historical Provider, MD  ezetimibe-simvastatin (VYTORIN) 10-20 MG per tablet Take 1 tablet by mouth daily.     Historical Provider, MD  furosemide (LASIX) 40 MG tablet Take 40 mg by mouth daily.      Historical Provider, MD  indomethacin (INDOCIN) 50 MG capsule Take 50 mg by mouth 3 (three) times daily as needed (pain).     Historical Provider, MD  insulin detemir (LEVEMIR FLEXPEN) 100 UNIT/ML injection Inject 90 Units into the skin at bedtime.     Historical Provider, MD  insulin lispro (HUMALOG KWIKPEN) 100 UNIT/ML SOPN Inject 10-50 Units into the skin 3 (three) times daily with meals. <70= no coverage 70-150= 10 units 151-200=15units 201-250=20units 251-300=25units 301-350=30units 351-400=35units 401-450=40units >450= 50 units    Historical Provider, MD  losartan (COZAAR) 25 MG tablet Take 25 mg by mouth daily.    Historical Provider, MD  metoprolol (LOPRESSOR) 100 MG tablet Take 100 mg by mouth 2 (two) times daily.    Historical Provider, MD  oxyCODONE-acetaminophen (PERCOCET/ROXICET) 5-325 MG tablet  Take 1 tablet by mouth every 4 (four) hours as needed. 08/23/15   Kristen N Ward, DO  potassium chloride SA (K-DUR,KLOR-CON) 20 MEQ tablet Take 20 mEq by mouth daily.     Historical Provider, MD  tiZANidine (ZANAFLEX) 4 MG tablet Take 8 mg by mouth at bedtime.     Historical Provider, MD    Scheduled Meds: . allopurinol  100 mg Oral Daily  . antiseptic oral rinse  7 mL Mouth Rinse BID  . citalopram  40 mg Oral Daily  . ezetimibe-simvastatin  1 tablet Oral Daily  . insulin aspart  0-15 Units Subcutaneous 6 times per day  . insulin detemir  30 Units Subcutaneous QHS  . metoprolol  100 mg Oral BID  . [START ON 01/24/2016] pantoprazole (PROTONIX) IV  40 mg Intravenous Q12H  . tiZANidine  8 mg Oral QHS   Continuous Infusions: . sodium chloride 125 mL/hr at 01/21/16 0636  . pantoprozole (PROTONIX) infusion 8 mg/hr (01/21/16 0251)   PRN Meds:.ondansetron (ZOFRAN) IV, oxyCODONE-acetaminophen  Allergies as of 01/20/2016 - Review Complete 08/22/2015  Allergen Reaction Noted  . Amlodipine besylate Swelling   . Ciprofloxacin Nausea Only and Swelling   . Hydrocodone-acetaminophen Itching 12/22/2014  . Metformin Other (See Comments)   . Quinolones Swelling 12/22/2014  . Sulfamethoxazole Swelling 12/22/2014  . Sulfonamide derivatives    . Valsartan Swelling 12/22/2014  . Lisinopril Swelling and Rash     Family History  Problem Relation Age of Onset  . Diabetes Mother   . Colon cancer Neg Hx     Social History   Social History  . Marital Status: Married    Spouse Name: N/A  . Number of Children: 2  . Years of Education: N/A   Occupational History  . Disabled    Social History Main Topics  . Smoking status: Never Smoker   . Smokeless tobacco: Never Used  . Alcohol Use: No  . Drug Use: No  . Sexual Activity: Not on file   Other Topics Concern  . Not on file   Social History Narrative   2 glasses of tea daily     Review of Systems: All negative except as stated above in  HPI.  Physical Exam: Vital signs: Filed Vitals:   01/21/16 0631 01/21/16 0844  BP: 94/42 108/38  Pulse: 72 82  Temp: 98.8 F (37.1 C)   Resp: 18    Last BM Date: 01/20/16 General:   Alert,  Well-developed, well-nourished, no acute distress Head: atraumatic Eyes: pupils equal and reactive ENT: oropharynx clear Neck: surgical incision noted posteriorly Lungs:  Clear throughout to auscultation.   No wheezes, crackles, or rhonchi. No acute distress. Heart:  Regular rate and rhythm; no murmurs, clicks, rubs,  or gallops. Abdomen: upper quadrant tenderness with guarding, soft, nondistended, +BS  Rectal:  Deferred Ext: no edema  GI:  Lab Results:  Recent Labs  01/21/16 0445  WBC 10.8*  HGB 8.7*  HCT 26.7*  PLT 223   BMET  Recent Labs  01/21/16 0445  NA 139  K 3.6  CL 100*  CO2 21*  GLUCOSE 125*  BUN 61*  CREATININE 5.86*  CALCIUM 6.8*   LFT  Recent Labs  01/21/16 0445  PROT 5.6*  ALBUMIN 2.9*  AST 30  ALT 17  ALKPHOS 68  BILITOT 0.9   PT/INR  Recent Labs  01/21/16 0445  LABPROT 18.8*  INR 1.57*     Studies/Results: No results found.  Impression/Plan: 65 yo with melena, coffee grounds emesis in the setting of recurrent vomiting. Suspect Mallory Weiss tear vs peptic ulcer. Supportive care. NPO. Continue Protonix drip for now.    LOS: 0 days   [place] C.  01/21/2016, 10:27 AM  Pager [phone]  If no answer or after 5 PM call [phone]

## 2016-01-21 NOTE — Progress Notes (Signed)
Initial Nutrition Assessment  DOCUMENTATION CODES:   Obesity unspecified  INTERVENTION:   -RD will follow for diet advancement and supplement diet as appopriate  NUTRITION DIAGNOSIS:   Inadequate oral intake related to altered GI function as evidenced by NPO status.  GOAL:   Patient will meet greater than or equal to 90% of their needs  MONITOR:   Diet advancement, Labs, Weight trends, Skin, I & O's  REASON FOR ASSESSMENT:   Malnutrition Screening Tool    ASSESSMENT:   Heather [name] is a 65 y.o. female with h/o cyclic vomiting due possibly to diabetic gastroparesis, DM2, HTN, gout, CKD. Patient presents to the ED at [facility] with c/o melena and coffee ground emesis. Patient had non-bloody emesis per report yesterday, today emesis became coffee ground in color and was associated with melena. She presented to the ED with this at [facility].  Pt admitted with upper GIB.   Pt is currently NPO for EGD with GI today.   Spoke with pt at bedside. She reports she "hasn't had a good meal in 2 months". She endorses poor appetite due to stomach pain. PTA she was eating two times daily, meals consisting mainly of vegetables. Pt reports she was also consuming [employer] as a meal replacement when she did not not eat a lot during a meal.   Pt reports she has lost weight, but is unable to provide accurate wt hx. Per documented wt hx, UBW is around 170# and weight has been stable over the past year.   Nutrition-Focused physical exam completed. Findings are no fat depletion, no muscle depletion, and no edema.   Pt reports she is eager to eat once her diet is advanced. Discussed importance of good meal intake to promote healing.   Labs reviewed: CBGS: 111-125.   Diet Order:  Diet NPO time specified Except for: Sips with Meds, Ice Chips  Skin:  Reviewed, no issues  Last BM:  01/20/16  Height:   Ht Readings from Last 1 Encounters:  01/21/16 5\' 3"  (1.6  m)    Weight:   Wt Readings from Last 1 Encounters:  01/21/16 169 lb (76.658 kg)    Ideal Body Weight:  52.3 kg  BMI:  Body mass index is 29.94 kg/(m^2).  Estimated Nutritional Needs:   Kcal:  1600-1800  Protein:  75-85 grams  Fluid:  1.6-1.8 L  EDUCATION NEEDS:   Education needs addressed  Marquelle Musgrave A. Jimmye Norman, RD, LDN, CDE Pager: [phone] After hours Pager: [phone]

## 2016-01-21 NOTE — Consult Note (Signed)
Referring Provider: Dr. Alcario Drought Primary Care Physician:  [name], MD Primary Gastroenterologist:  Althia Forts  Reason for Consultation:  Melena  HPI: Heather [name] is a 65 y.o. female with multiple medical problems transferred from [facility] due to a GI bleed and lack of GI coverage there. Report of black stools and coffee grounds emesis that she does not recall. Reports constant upper abdominal pain that is dull and crampy. Daily heartburn that is relieved with Nexium. Reports history of a hiatal hernia. 20 pound unintentional weight loss in the past month. No history of hematochezia or hematemesis. Thinks she had a colonoscopy 2 years ago in [place] but does not know the results. Chart review shows a colonoscopy by Dr. Carlean Purl in 2012 that showed ileal erosions possibly from NSAIDs. Reports having neck surgery 2 weeks ago and has severe pain in her back when she lies flat. History of recurrent vomiting in the setting of diabetes. Hgb 8.7.   Past Medical History  Diagnosis Date  . Cellulitis and abscess     abdomen and buttocks  . Renal failure, unspecified   . Unspecified tinnitus     right  . Edema   . Diabetes mellitus   . Essential hypertension   . Headache(784.0)   . Irritable bowel syndrome   . Disorders of phosphorus metabolism   . Palpitations   . Depression   . Osteoarthrosis, unspecified whether generalized or localized, unspecified site   . B12 deficiency   . Pure hypercholesterolemia   . Bronchitis, not specified as acute or chronic   . Reflux esophagitis   . Diabetic neuropathy ([place])   . Gout   . Hypopotassemia   . Folliculitis   . Insomnia   . DJD (degenerative joint disease)     right forminal stenosis C4-5  . Hiatal hernia     Past Surgical History  Procedure Laterality Date  . C-spine surgery      03/2007  . Cardiac catheterization    . Total abdominal hysterectomy    . Cholecystectomy    . Right knee arthroscopic surgery    .  Colonoscopy  04/21/2011; 05/29/11    6/12: [facility] - ?AVM at IC valve, inflammatory changes at [facility] valve [facility]); 7/12 - g[facility]ner; IC valve erosions, look chronic and probably Crohn's per path  . Esophagogastroduodenoscopy  05/29/11    normal  . Joint replacement      Prior to Admission medications   Medication Sig Start Date End Date Taking? Authorizing Provider  allopurinol (ZYLOPRIM) 100 MG tablet Take 100 mg by mouth daily.    Historical Provider, MD  cholecalciferol (VITAMIN D) 1000 UNITS tablet Take 1,000 Units by mouth daily.      Historical Provider, MD  citalopram (CELEXA) 40 MG tablet Take 40 mg by mouth daily. 08/05/15   Historical Provider, MD  cyanocobalamin (,VITAMIN B-12,) 1000 MCG/ML injection Inject 1,000 mcg into the muscle every 30 (thirty) days.      Historical Provider, MD  esomeprazole (NEXIUM) 40 MG capsule Take 40 mg by mouth daily before breakfast.      Historical Provider, MD  ezetimibe-simvastatin (VYTORIN) 10-20 MG per tablet Take 1 tablet by mouth daily.     Historical Provider, MD  furosemide (LASIX) 40 MG tablet Take 40 mg by mouth daily.      Historical Provider, MD  indomethacin (INDOCIN) 50 MG capsule Take 50 mg by mouth 3 (three) times daily as needed (pain).     Historical Provider, MD  insulin detemir (LEVEMIR FLEXPEN) 100 UNIT/ML injection Inject 90 Units into the skin at bedtime.     Historical Provider, MD  insulin lispro (HUMALOG KWIKPEN) 100 UNIT/ML SOPN Inject 10-50 Units into the skin 3 (three) times daily with meals. <70= no coverage 70-150= 10 units 151-200=15units 201-250=20units 251-300=25units 301-350=30units 351-400=35units 401-450=40units >450= 50 units    Historical Provider, MD  losartan (COZAAR) 25 MG tablet Take 25 mg by mouth daily.    Historical Provider, MD  metoprolol (LOPRESSOR) 100 MG tablet Take 100 mg by mouth 2 (two) times daily.    Historical Provider, MD  oxyCODONE-acetaminophen (PERCOCET/ROXICET) 5-325 MG tablet  Take 1 tablet by mouth every 4 (four) hours as needed. 08/23/15   Kristen N Ward, DO  potassium chloride SA (K-DUR,KLOR-CON) 20 MEQ tablet Take 20 mEq by mouth daily.     Historical Provider, MD  tiZANidine (ZANAFLEX) 4 MG tablet Take 8 mg by mouth at bedtime.     Historical Provider, MD    Scheduled Meds: . allopurinol  100 mg Oral Daily  . antiseptic oral rinse  7 mL Mouth Rinse BID  . citalopram  40 mg Oral Daily  . ezetimibe-simvastatin  1 tablet Oral Daily  . insulin aspart  0-15 Units Subcutaneous 6 times per day  . insulin detemir  30 Units Subcutaneous QHS  . metoprolol  100 mg Oral BID  . [START ON 01/24/2016] pantoprazole (PROTONIX) IV  40 mg Intravenous Q12H  . tiZANidine  8 mg Oral QHS   Continuous Infusions: . sodium chloride 125 mL/hr at 01/21/16 0636  . pantoprozole (PROTONIX) infusion 8 mg/hr (01/21/16 0251)   PRN Meds:.ondansetron (ZOFRAN) IV, oxyCODONE-acetaminophen  Allergies as of 01/20/2016 - Review Complete 08/22/2015  Allergen Reaction Noted  . Amlodipine besylate Swelling   . Ciprofloxacin Nausea Only and Swelling   . Hydrocodone-acetaminophen Itching 12/22/2014  . Metformin Other (See Comments)   . Quinolones Swelling 12/22/2014  . Sulfamethoxazole Swelling 12/22/2014  . Sulfonamide derivatives    . Valsartan Swelling 12/22/2014  . Lisinopril Swelling and Rash     Family History  Problem Relation Age of Onset  . Diabetes Mother   . Colon cancer Neg Hx     Social History   Social History  . Marital Status: Married    Spouse Name: N/A  . Number of Children: 2  . Years of Education: N/A   Occupational History  . Disabled    Social History Main Topics  . Smoking status: Never Smoker   . Smokeless tobacco: Never Used  . Alcohol Use: No  . Drug Use: No  . Sexual Activity: Not on file   Other Topics Concern  . Not on file   Social History Narrative   2 glasses of tea daily     Review of Systems: All negative except as stated above in  HPI.  Physical Exam: Vital signs: Filed Vitals:   01/21/16 0631 01/21/16 0844  BP: 94/42 108/38  Pulse: 72 82  Temp: 98.8 F (37.1 C)   Resp: 18    Last BM Date: 01/20/16 General:   Alert,  Well-developed, well-nourished, no acute distress Head: atraumatic Eyes: pupils equal and reactive ENT: oropharynx clear Neck: surgical incision noted posteriorly Lungs:  Clear throughout to auscultation.   No wheezes, crackles, or rhonchi. No acute distress. Heart:  Regular rate and rhythm; no murmurs, clicks, rubs,  or gallops. Abdomen: upper quadrant tenderness with guarding, soft, nondistended, +BS  Rectal:  Deferred Ext: no edema  GI:  Lab Results:  Recent Labs  01/21/16 0445  WBC 10.8*  HGB 8.7*  HCT 26.7*  PLT 223   BMET  Recent Labs  01/21/16 0445  NA 139  K 3.6  CL 100*  CO2 21*  GLUCOSE 125*  BUN 61*  CREATININE 5.86*  CALCIUM 6.8*   LFT  Recent Labs  01/21/16 0445  PROT 5.6*  ALBUMIN 2.9*  AST 30  ALT 17  ALKPHOS 68  BILITOT 0.9   PT/INR  Recent Labs  01/21/16 0445  LABPROT 18.8*  INR 1.57*     Studies/Results: No results found.  Impression/Plan: 65 yo with melena, coffee grounds emesis in the setting of recurrent vomiting. Suspect Mallory Weiss tear vs peptic ulcer. Supportive care. NPO. Continue Protonix drip for now.    LOS: 0 days   Raven C.  01/21/2016, 10:27 AM  Pager [phone]  If no answer or after 5 PM call [phone]

## 2016-01-21 NOTE — Brief Op Note (Signed)
Pre-pyloric ulcer with small amount of bleeding seen. Minimal erosive esophagitis. See procedure note for details. Sips of water and ice chips only today and then slowly advance starting tomorrow if ok.

## 2016-01-21 NOTE — H&P (Signed)
[facility] History and Physical  ARIBEL CHAUSSEE [id number] DOB: 15-Oct-1951 DOA: 01/21/2016  Referring physician: EDP PCP: [name], MD   Chief Complaint: Hematemesis   HPI: Heather [name] is a 65 y.o. female with h/o cyclic vomiting due possibly to diabetic gastroparesis, DM2, HTN, gout, CKD.  Patient presents to the ED at [facility] with c/o melena and coffee ground emesis.  Patient had non-bloody emesis per report yesterday, today emesis became coffee ground in color and was associated with melena.  She presented to the ED with this at [facility].  At [facility] her emesis resolved, but labs showed creatinine of 5.11 today (was 1.3 just earlier this month at [facility] according to care everywhere labs).  HGB 10.0 (actually was 8.3 earlier this month at [facility]).  Review of Systems: Systems reviewed.  As above, otherwise negative  Past Medical History  Diagnosis Date  . Cellulitis and abscess     abdomen and buttocks  . Renal failure, unspecified   . Unspecified tinnitus     right  . Edema   . Diabetes mellitus   . Essential hypertension   . Headache(784.0)   . Irritable bowel syndrome   . Disorders of phosphorus metabolism   . Palpitations   . Depression   . Osteoarthrosis, unspecified whether generalized or localized, unspecified site   . B12 deficiency   . Pure hypercholesterolemia   . Bronchitis, not specified as acute or chronic   . Reflux esophagitis   . Diabetic neuropathy ([place])   . Gout   . Hypopotassemia   . Folliculitis   . Insomnia   . DJD (degenerative joint disease)     right forminal stenosis C4-5  . Hiatal hernia    Past Surgical History  Procedure Laterality Date  . C-spine surgery      03/2007  . Cardiac catheterization    . Total abdominal hysterectomy    . Cholecystectomy    . Right knee arthroscopic surgery    . Colonoscopy  04/21/2011; 05/29/11    6/12: [facility] - ?AVM at IC valve, inflammatory changes at [facility] valve [facility]); 7/12 - g[facility]ner; IC valve erosions, look chronic and probably Crohn's per path  . Esophagogastroduodenoscopy  05/29/11    normal  . Joint replacement     Social History:  reports that she has never smoked. She has never used smokeless tobacco. She reports that she does not drink alcohol or use illicit drugs.  Allergies  Allergen Reactions  . Amlodipine Besylate Swelling  . Ciprofloxacin Nausea Only and Swelling  . Hydrocodone-Acetaminophen Itching  . Metformin Other (See Comments)    REACTION: GI UPSET  . Quinolones Swelling  . Sulfamethoxazole Swelling  . Sulfonamide Derivatives   . Valsartan Swelling  . Lisinopril Swelling and Rash    Family History  Problem Relation Age of Onset  . Diabetes Mother   . Colon cancer Neg Hx      Prior to Admission medications   Medication Sig Start Date End Date Taking? Authorizing Provider  allopurinol (ZYLOPRIM) 100 MG tablet Take 100 mg by mouth daily.    Historical Provider, MD  cholecalciferol (VITAMIN D) 1000 UNITS tablet Take 1,000 Units by mouth daily.      Historical Provider, MD  citalopram (CELEXA) 40 MG tablet Take 40 mg by mouth daily. 08/05/15   Historical Provider, MD  cyanocobalamin (,VITAMIN B-12,) 1000 MCG/ML injection Inject 1,000 mcg into the muscle every 30 (thirty) days.      Historical Provider, MD  esomeprazole (NEXIUM) 40 MG capsule Take 40 mg by mouth daily before breakfast.      Historical Provider, MD  ezetimibe-simvastatin (VYTORIN) 10-20 MG per tablet Take 1 tablet by mouth daily.     Historical Provider, MD  furosemide (LASIX) 40 MG tablet Take 40 mg by mouth daily.      Historical Provider, MD  indomethacin (INDOCIN) 50 MG capsule Take 50 mg by mouth 3 (three) times daily as needed (pain).     Historical Provider, MD  insulin detemir (LEVEMIR FLEXPEN) 100 UNIT/ML injection Inject 90 Units into the skin at bedtime.     Historical Provider, MD  insulin lispro (HUMALOG KWIKPEN) 100 UNIT/ML SOPN Inject 10-50  Units into the skin 3 (three) times daily with meals. <70= no coverage 70-150= 10 units 151-200=15units 201-250=20units 251-300=25units 301-350=30units 351-400=35units 401-450=40units >450= 50 units    Historical Provider, MD  losartan (COZAAR) 25 MG tablet Take 25 mg by mouth daily.    Historical Provider, MD  metoprolol (LOPRESSOR) 100 MG tablet Take 100 mg by mouth 2 (two) times daily.    Historical Provider, MD  oxyCODONE-acetaminophen (PERCOCET/ROXICET) 5-325 MG tablet Take 1 tablet by mouth every 4 (four) hours as needed. 08/23/15   Kristen N Ward, DO  potassium chloride SA (K-DUR,KLOR-CON) 20 MEQ tablet Take 20 mEq by mouth daily.     Historical Provider, MD  tiZANidine (ZANAFLEX) 4 MG tablet Take 8 mg by mouth at bedtime.     Historical Provider, MD   Physical Exam: Filed Vitals:   01/21/16 0140  BP: 114/89  Pulse: 79  Temp: 98.5 F (36.9 C)  Resp: 18    BP 114/89 mmHg  Pulse 79  Temp(Src) 98.5 F (36.9 C) (Oral)  Resp 18  Ht 5\' 3"  (1.6 m)  Wt 76.658 kg (169 lb)  BMI 29.94 kg/m2  SpO2 100%  General Appearance:    Alert, oriented, no distress, appears stated age  Head:    Normocephalic, atraumatic  Eyes:    PERRL, EOMI, sclera non-icteric        Nose:   Nares without drainage or epistaxis. Mucosa, turbinates normal  Throat:   Moist mucous membranes. Oropharynx without erythema or exudate.  Neck:   Supple. No carotid bruits.  No thyromegaly.  No lymphadenopathy.   Back:     No CVA tenderness, no spinal tenderness  Lungs:     Clear to auscultation bilaterally, without wheezes, rhonchi or rales  Chest wall:    No tenderness to palpitation  Heart:    Regular rate and rhythm without murmurs, gallops, rubs  Abdomen:     Soft, non-tender, nondistended, normal bowel sounds, no organomegaly  Genitalia:    deferred  Rectal:    deferred  Extremities:   No clubbing, cyanosis or edema.  Pulses:   2+ and symmetric all extremities  Skin:   Skin color, texture, turgor  normal, no rashes or lesions  Lymph nodes:   Cervical, supraclavicular, and axillary nodes normal  Neurologic:   CNII-XII intact. Normal strength, sensation and reflexes      throughout    Labs on Admission:  Basic Metabolic Panel: No results for input(s): NA, K, CL, CO2, GLUCOSE, BUN, CREATININE, CALCIUM, MG, PHOS in the last 168 hours. Liver Function Tests: No results for input(s): AST, ALT, ALKPHOS, BILITOT, PROT, ALBUMIN in the last 168 hours. No results for input(s): LIPASE, AMYLASE in the last 168 hours. No results for input(s): AMMONIA in the last 168 hours. CBC: No results for  input(s): WBC, NEUTROABS, HGB, HCT, MCV, PLT in the last 168 hours. Cardiac Enzymes: No results for input(s): CKTOTAL, CKMB, CKMBINDEX, TROPONINI in the last 168 hours.  BNP (last 3 results) No results for input(s): PROBNP in the last 8760 hours. CBG: No results for input(s): GLUCAP in the last 168 hours.  Radiological Exams on Admission: No results found.  EKG: Independently reviewed.  Assessment/Plan Principal Problem:   Upper GI bleed Active Problems:   Chronic Nausea and Vomiting - ? gastroapresis   Essential hypertension   Diabetes mellitus (H[facility])   Gout   Coffee ground emesis   Hematemesis   Melena   Acute renal failure superimposed on stage 3 chronic kidney disease ([place])   1. UGIB - 1. Dr. Michail Sermon coming in AM to eval (and likely do EGD) 2. NPO except meds and ice chips 3. IVF 4. Ordering labs on admit, HGB was 10.0 at [facility] which is actually up from 8.3 on 3/12 2. AKF on CKD stage 3 - 1. Creatinine was 5.1 and BUN of 50 at [facility], this is significantly up from creatinine of 1.3 on 3/12 (Care Everywhere on Epic). 2. IVF 3. Holding lisinopril 4. BMP now 3. Gout - continue allopurinol 4. HTN - continue metoprolol, hold lisinopril due to AKI  Dr. Michail Sermon is coming to eval in AM  Code Status: Full  Family Communication: Family at bedside Disposition Plan: Admit to  inpatient   Time spent: 70 min  GARDNER, JARED M. [facility] Pager [phone]  If 7AM-7PM, please contact the day team taking care of the patient [email or web address] Password [email or web address] 01/21/2016, 2:25 AM

## 2016-01-21 NOTE — Interval H&P Note (Signed)
History and Physical Interval Note:  01/21/2016 12:21 PM  Heather [name]  has presented today for surgery, with the diagnosis of melena  The various methods of treatment have been discussed with the patient and family. After consideration of risks, benefits and other options for treatment, the patient has consented to  Procedure(s): ESOPHAGOGASTRODUODENOSCOPY (EGD) (N/A) as a surgical intervention .  The patient's history has been reviewed, patient examined, no change in status, stable for surgery.  I have reviewed the patient's chart and labs.  Questions were answered to the patient's satisfaction.     [place] C.

## 2016-01-22 ENCOUNTER — Encounter (HOSPITAL_COMMUNITY): Payer: Self-pay | Admitting: Gastroenterology

## 2016-01-22 LAB — CBC
HEMATOCRIT: 24.8 % — AB (ref 36.0–46.0)
HEMOGLOBIN: 8.1 g/dL — AB (ref 12.0–15.0)
MCH: 28.7 pg (ref 26.0–34.0)
MCHC: 32.7 g/dL (ref 30.0–36.0)
MCV: 87.9 fL (ref 78.0–100.0)
Platelets: 179 10*3/uL (ref 150–400)
RBC: 2.82 MIL/uL — AB (ref 3.87–5.11)
RDW: 15.7 % — ABNORMAL HIGH (ref 11.5–15.5)
WBC: 7.1 10*3/uL (ref 4.0–10.5)

## 2016-01-22 LAB — GLUCOSE, CAPILLARY
GLUCOSE-CAPILLARY: 142 mg/dL — AB (ref 65–99)
GLUCOSE-CAPILLARY: 149 mg/dL — AB (ref 65–99)
GLUCOSE-CAPILLARY: 153 mg/dL — AB (ref 65–99)
Glucose-Capillary: 159 mg/dL — ABNORMAL HIGH (ref 65–99)
Glucose-Capillary: 137 mg/dL — ABNORMAL HIGH (ref 65–99)
Glucose-Capillary: 123 mg/dL — ABNORMAL HIGH (ref 65–99)
Glucose-Capillary: 149 mg/dL — ABNORMAL HIGH (ref 65–99)
Glucose-Capillary: 143 mg/dL — ABNORMAL HIGH (ref 65–99)

## 2016-01-22 LAB — BASIC METABOLIC PANEL
ANION GAP: 15 (ref 5–15)
BUN: 67 mg/dL — ABNORMAL HIGH (ref 6–20)
CALCIUM: 6.7 mg/dL — AB (ref 8.9–10.3)
CO2: 18 mmol/L — ABNORMAL LOW (ref 22–32)
CREATININE: 5.1 mg/dL — AB (ref 0.44–1.00)
Chloride: 106 mmol/L (ref 101–111)
GFR calc non Af Amer: 8 mL/min — ABNORMAL LOW (ref >60)
GFR, EST AFRICAN AMERICAN: 9 mL/min — AB (ref >60)
Glucose, Bld: 180 mg/dL — ABNORMAL HIGH (ref 65–99)
Potassium: 3.5 mmol/L (ref 3.5–5.1)
SODIUM: 139 mmol/L (ref 135–145)

## 2016-01-22 MED ORDER — INSULIN ASPART 100 UNIT/ML ~~LOC~~ SOLN
0.0000 [IU] | Freq: Every day | SUBCUTANEOUS | Status: DC
  Administered 2016-01-23 – 2016-01-24 (×2): 3 [IU] via SUBCUTANEOUS

## 2016-01-22 MED ORDER — INSULIN ASPART 100 UNIT/ML ~~LOC~~ SOLN
0.0000 [IU] | Freq: Three times a day (TID) | SUBCUTANEOUS | Status: DC
  Administered 2016-01-23: 3 [IU] via SUBCUTANEOUS
  Administered 2016-01-23: 5 [IU] via SUBCUTANEOUS
  Administered 2016-01-24: 8 [IU] via SUBCUTANEOUS
  Administered 2016-01-24: 11 [IU] via SUBCUTANEOUS
  Administered 2016-01-24: 8 [IU] via SUBCUTANEOUS
  Administered 2016-01-25: 2 [IU] via SUBCUTANEOUS

## 2016-01-22 NOTE — Progress Notes (Signed)
Patient ID: Heather [name], female   DOB: July 03, 1951, 65 y.o.   MRN: [id number] [facility]astroenterology Progress Note  Heather [name] 65 y.o. 1951-04-09   Subjective: Sitting up in bed. No complaints. Husband at bedside. Tolerating liquid diet.  Objective: Vital signs in last 24 hours: Filed Vitals:   01/21/16 2110 01/22/16 0455  BP: 102/48 104/51  Pulse: 82 70  Temp: 97.6 F (36.4 C) 97.6 F (36.4 C)  Resp: 17 18    Physical Exam: Gen: alert, no acute distress HEENT: anicteric sclera   Lab Results:  Recent Labs  01/21/16 0445 01/22/16 0616  NA 139 139  K 3.6 3.5  CL 100* 106  CO2 21* 18*  GLUCOSE 125* 180*  BUN 61* 67*  CREATININE 5.86* 5.10*  CALCIUM 6.8* 6.7*    Recent Labs  01/21/16 0445  AST 30  ALT 17  ALKPHOS 68  BILITOT 0.9  PROT 5.6*  ALBUMIN 2.9*    Recent Labs  01/21/16 0445 01/22/16 1236  WBC 10.8* 7.1  HGB 8.7* 8.1*  HCT 26.7* 24.8*  MCV 89.3 87.9  PLT 223 179    Recent Labs  01/21/16 0445  LABPROT 18.8*  INR 1.57*      Assessment/Plan: S/P GI bleed with small pre-pyloric channel ulcer and minimal erosive esophagitis. No further bleeding. Advance diet. Check H. Pylori serology and treat if positive (f/u as outpt). Ok to d/c tomorrow from GI standpoint if can tolerate solid food and no further bleeding. Will sign off. Call if questions. F/U with [facility] as needed.   [place] C. 01/22/2016, 2:12 PM  Pager [phone]  If no answer or after 5 PM call [phone]

## 2016-01-22 NOTE — Progress Notes (Signed)
Pt has had increased confusion this shift. Asking this RN where she was, how she got here, and why she was here. Patient asked multiple times where her husband was and why she was in her "house" alone. Patient also stated she was seeing a little boy hanging out in her room. Also patient lost an earring in her bed. This RN was able to find the back of the earring but not the front. Will continue to monitor.

## 2016-01-22 NOTE — Progress Notes (Signed)
[facility] PROGRESS NOTE  HAMNAH MCGURK [id number] DOB: April 01, 1951 DOA: 01/21/2016 PCP: [name], MD  Brief narrative - 65 y/o that presented with coffee-ground emesis. GI consulted and patient was found to have prepyloric ulcer. GI on board and currently assisting with management.  Assessment/Plan: Principal Problem:   Upper GI bleed -Secondary to prepyloric ulcer with small amount of bleeding seen. - GI consulted and currently assisting with medical management. Patient is on Protonix drip  Active Problems:   Chronic Nausea and Vomiting - ? gastroapresis - Resolved    Essential hypertension   Diabetes mellitus ([place]) - Patient on Levemir and sliding scale insulin    Gout - Was given colchicine once yesterday    Acute renal failure superimposed on stage 3 chronic kidney disease (HCC) -Serum creatinine trending down currently  Code Status: full Family Communication: no family at bedside Disposition Plan: pending improvement in condition   Consultants:  GI  Procedures:  None  Antibiotics:  None  HPI/Subjective: Pt has no new complaints. No acute issues reported overnight.  Objective: Filed Vitals:   01/21/16 2110 01/22/16 0455  BP: 102/48 104/51  Pulse: 82 70  Temp: 97.6 F (36.4 C) 97.6 F (36.4 C)  Resp: 17 18    Intake/Output Summary (Last 24 hours) at 01/22/16 1200 Last data filed at 01/21/16 1811  Gross per 24 hour  Intake      0 ml  Output      0 ml  Net      0 ml   Filed Weights   01/21/16 0140  Weight: 76.658 kg (169 lb)    Exam:   General:  Pt in nad, alert and awake  Cardiovascular: no cyanosis  Respiratory: no increased wob, no wheezes  Abdomen: obese, ND, no guarding  Musculoskeletal: no cyanosis or clubbing   Data Reviewed: Basic Metabolic Panel:  Recent Labs Lab 01/21/16 0445 01/22/16 0616  NA 139 139  K 3.6 3.5  CL 100* 106  CO2 21* 18*  GLUCOSE 125* 180*  BUN 61* 67*  CREATININE 5.86* 5.10*   CALCIUM 6.8* 6.7*   Liver Function Tests:  Recent Labs Lab 01/21/16 0445  AST 30  ALT 17  ALKPHOS 68  BILITOT 0.9  PROT 5.6*  ALBUMIN 2.9*   No results for input(s): LIPASE, AMYLASE in the last 168 hours. No results for input(s): AMMONIA in the last 168 hours. CBC:  Recent Labs Lab 01/21/16 0445  WBC 10.8*  HGB 8.7*  HCT 26.7*  MCV 89.3  PLT 223   Cardiac Enzymes: No results for input(s): CKTOTAL, CKMB, CKMBINDEX, TROPONINI in the last 168 hours. BNP (last 3 results) No results for input(s): BNP in the last 8760 hours.  ProBNP (last 3 results) No results for input(s): PROBNP in the last 8760 hours.  CBG:  Recent Labs Lab 01/21/16 1656 01/21/16 2031 01/22/16 0019 01/22/16 0453 01/22/16 0756  GLUCAP 125* 114* 149* 159* 137*    Recent Results (from the past 240 hour(s))  MRSA PCR Screening     Status: None   Collection Time: 01/21/16  1:48 AM  Result Value Ref Range Status   MRSA by PCR NEGATIVE NEGATIVE Final    Comment:        The GeneXpert MRSA Assay (FDA approved for NASAL specimens only), is one component of a comprehensive MRSA colonization surveillance program. It is not intended to diagnose MRSA infection nor to guide or monitor treatment for MRSA infections.      Studies: No  results found.  Scheduled Meds: . allopurinol  100 mg Oral Daily  . antiseptic oral rinse  7 mL Mouth Rinse BID  . citalopram  40 mg Oral Daily  . colchicine  0.6 mg Oral Once  . ezetimibe-simvastatin  1 tablet Oral Daily  . insulin aspart  0-15 Units Subcutaneous 6 times per day  . insulin detemir  30 Units Subcutaneous QHS  . metoprolol  100 mg Oral BID  . [START ON 01/24/2016] pantoprazole (PROTONIX) IV  40 mg Intravenous Q12H  . tiZANidine  8 mg Oral QHS   Continuous Infusions: . sodium chloride 125 mL/hr at 01/22/16 0518  . pantoprozole (PROTONIX) infusion 8 mg/hr (01/22/16 0220)     Time spent:> 35 minutes    Velvet Bathe  [facility] Pager [phone] If 7PM-7AM, please contact night-coverage at [email or web address], password [email or web address] 01/22/2016, 12:00 PM  LOS: 1 day

## 2016-01-23 DIAGNOSIS — N179 Acute kidney failure, unspecified: Secondary | ICD-10-CM

## 2016-01-23 DIAGNOSIS — K920 Hematemesis: Secondary | ICD-10-CM

## 2016-01-23 DIAGNOSIS — N183 Chronic kidney disease, stage 3 (moderate): Secondary | ICD-10-CM

## 2016-01-23 DIAGNOSIS — K922 Gastrointestinal hemorrhage, unspecified: Secondary | ICD-10-CM

## 2016-01-23 LAB — BASIC METABOLIC PANEL
ANION GAP: 12 (ref 5–15)
BUN: 56 mg/dL — AB (ref 6–20)
CHLORIDE: 109 mmol/L (ref 101–111)
CO2: 17 mmol/L — AB (ref 22–32)
Calcium: 7.7 mg/dL — ABNORMAL LOW (ref 8.9–10.3)
Creatinine, Ser: 3.4 mg/dL — ABNORMAL HIGH (ref 0.44–1.00)
GFR calc Af Amer: 15 mL/min — ABNORMAL LOW (ref >60)
GFR, EST NON AFRICAN AMERICAN: 13 mL/min — AB (ref >60)
GLUCOSE: 142 mg/dL — AB (ref 65–99)
POTASSIUM: 3.7 mmol/L (ref 3.5–5.1)
Sodium: 138 mmol/L (ref 135–145)

## 2016-01-23 LAB — CBC
HCT: 26.9 % — ABNORMAL LOW (ref 36.0–46.0)
Hemoglobin: 8.5 g/dL — ABNORMAL LOW (ref 12.0–15.0)
MCH: 28 pg (ref 26.0–34.0)
MCHC: 31.6 g/dL (ref 30.0–36.0)
MCV: 88.5 fL (ref 78.0–100.0)
PLATELETS: 200 10*3/uL (ref 150–400)
RBC: 3.04 MIL/uL — ABNORMAL LOW (ref 3.87–5.11)
RDW: 15.6 % — AB (ref 11.5–15.5)
WBC: 6 10*3/uL (ref 4.0–10.5)

## 2016-01-23 LAB — GLUCOSE, CAPILLARY
GLUCOSE-CAPILLARY: 62 mg/dL — AB (ref 65–99)
GLUCOSE-CAPILLARY: 56 mg/dL — AB (ref 65–99)
GLUCOSE-CAPILLARY: 99 mg/dL (ref 65–99)
GLUCOSE-CAPILLARY: 165 mg/dL — AB (ref 65–99)
GLUCOSE-CAPILLARY: 229 mg/dL — AB (ref 65–99)
Glucose-Capillary: 55 mg/dL — ABNORMAL LOW (ref 65–99)

## 2016-01-23 LAB — H. PYLORI ANTIBODY, IGG: H Pylori IgG: <0.9 U/mL (ref 0.0–0.8)

## 2016-01-23 LAB — URIC ACID: Uric Acid, Serum: 6.3 mg/dL (ref 2.3–6.6)

## 2016-01-23 MED ORDER — PANTOPRAZOLE SODIUM 40 MG PO TBEC
40.0000 mg | DELAYED_RELEASE_TABLET | Freq: Two times a day (BID) | ORAL | Status: DC
  Administered 2016-01-23 – 2016-01-25 (×4): 40 mg via ORAL
  Filled 2016-01-23 (×4): qty 1

## 2016-01-23 MED ORDER — LORAZEPAM 1 MG PO TABS
1.0000 mg | ORAL_TABLET | Freq: Once | ORAL | Status: AC
  Administered 2016-01-23: 1 mg via ORAL
  Filled 2016-01-23: qty 1

## 2016-01-23 MED ORDER — OXYCODONE-ACETAMINOPHEN 5-325 MG PO TABS
1.0000 | ORAL_TABLET | ORAL | Status: DC | PRN
  Administered 2016-01-23 – 2016-01-24 (×5): 1 via ORAL
  Filled 2016-01-23 (×5): qty 1

## 2016-01-23 MED ORDER — METHYLPREDNISOLONE SODIUM SUCC 40 MG IJ SOLR
40.0000 mg | Freq: Two times a day (BID) | INTRAMUSCULAR | Status: DC
  Administered 2016-01-23 (×2): 40 mg via INTRAVENOUS
  Filled 2016-01-23 (×2): qty 1

## 2016-01-23 MED ORDER — FUROSEMIDE 40 MG PO TABS: 40.0000 mg | ORAL_TABLET | Freq: Every day | ORAL | Status: DC

## 2016-01-23 MED ORDER — GLUCOSE 40 % PO GEL
1.0000 | Freq: Once | ORAL | Status: AC
  Administered 2016-01-23: 37.5 g via ORAL

## 2016-01-23 MED ORDER — FUROSEMIDE 20 MG PO TABS
20.0000 mg | ORAL_TABLET | Freq: Every day | ORAL | Status: DC
  Administered 2016-01-24 – 2016-01-25 (×2): 20 mg via ORAL
  Filled 2016-01-23 (×2): qty 1

## 2016-01-23 MED ORDER — DEXTROSE-NACL 5-0.9 % IV SOLN: INTRAVENOUS | Status: DC

## 2016-01-23 MED ORDER — DEXTROSE INFANT ORAL GEL 40%
0.5000 mL/kg | ORAL | Status: DC | PRN
Start: 2016-01-23 — End: 2016-01-23

## 2016-01-23 MED ORDER — GLUCOSE 40 % PO GEL
ORAL | Status: AC
  Filled 2016-01-23: qty 1

## 2016-01-23 MED ORDER — INSULIN DETEMIR 100 UNIT/ML ~~LOC~~ SOLN
25.0000 [IU] | Freq: Every day | SUBCUTANEOUS | Status: DC
Start: 2016-01-23 — End: 2016-01-25
  Administered 2016-01-23 – 2016-01-24 (×2): 25 [IU] via SUBCUTANEOUS
  Filled 2016-01-23 (×3): qty 0.25

## 2016-01-23 NOTE — Progress Notes (Signed)
Hypoglycemic Event  CBG: 62  Treatment: 15 GM carbohydrate snack  Symptoms: None  Follow-up CBG: Time:0836 CBG Result:55  Possible Reasons for Event: Medication regimen: lantus 30 units at bedtime  Comments/MD notified: N. Abrol      Heather [name], [place]

## 2016-01-23 NOTE — Progress Notes (Signed)
Hypoglycemic Event  CBG: 56  Treatment: 1 tube instant glucose  Symptoms: None  Follow-up CBG: Time: 0953 CBG Result: 99  Possible Reasons for Event: unknown  Comments/MD notified:Abrol,  Pt ate her breakfast and cbg was rechecked with result of 56. Pt given glutose po and recheck with cbg of [place], [place]

## 2016-01-23 NOTE — Care Management Note (Signed)
Case Management Note  Patient Details  Name: GEMMA LEGGIO MRN: [id number] Date of Birth: 07-04-1951  Subjective/Objective:                 Spoke with patient in the room. She states she had back surgery 2 1/2 weeks ago. She is active with [facility] for [facility]. She states that she has a walker at home. She lives at home with her husband. Patient c/o pain and swelling to feet and right elbow stating she has gout and this happens every time she is in the hospital, MD at bedside to examine.    Action/Plan:  Anticipate DC to home tomorrow if symptoms better with HH.   Expected Discharge Date:                  Expected Discharge Plan:  [place]  In-House Referral:     Discharge planning Services  CM Consult  Post Acute Care Choice:  Resumption of Svcs/PTA Provider, Home Health Choice offered to:  Patient  DME Arranged:    DME Agency:     HH Arranged:    [place] Agency:     Status of Service:  In process, will continue to follow  Medicare Important Message Given:    Date Medicare IM Given:    Medicare IM give by:    Date Additional Medicare IM Given:    Additional Medicare Important Message give by:     If discussed at [place] of Stay Meetings, dates discussed:    Additional Comments:  Carles Collet, RN 01/23/2016, 1:35 PM

## 2016-01-23 NOTE — Progress Notes (Signed)
[facility] PROGRESS NOTE  Heather [name] [id number] DOB: Oct 20, 1951 DOA: 01/21/2016 PCP: [name], MD  Brief narrative - 65 y/o that presented with coffee-ground emesis. GI consulted and patient was found to have prepyloric ulcer. GI on board and currently assisting with management.  Assessment/Plan:     Upper GI bleed -Secondary to prepyloric ulcer with small amount of bleeding seen.H pylori antibody negative Status post EGD, slowly advance to regular diet, Adamantly refusing, modified diet Very unhappy about Protonix, would like her Nexium Baseline hemoglobin around 10, hemoglobin, Now 8.1, no recurrent bleeding overnight      Chronic Nausea and Vomiting - ? gastroapresis - Resolved    Essential hypertension-Stable, Continue Lopressor    Diabetes mellitus ([place]) - Patient on Levemir and sliding scale insulin Hypoglycemic this morning Continue Levemir at the current dose as the patient is also receiving IV Solu-Medrol, anticipate that the sugar will stabilize    Gout With acute flare, check uric acid level Given chronic kidney disease unable to get colchicine, therefore we'll give 1 dose of Solu-Medrol Also requesting Percocet to be increased to every 3 hours instead of every 4 hours    Acute renal failure superimposed on stage 3 chronic kidney disease ([place]) Baseline 1.4 Presented with a creatinine of 5.86, slowly improving now 3.4 Extremely unhappy about bilateral lower extremity edema which is trace on my exam Would like her Lasix to be restarted  Code Status: full Family Communication: no family at bedside Disposition Plan: PT evaluation, anticipate discharge tomorrow   Consultants:  GI  Procedures:  None  Antibiotics:  None  HPI/Subjective: Multiple complaints including multiple joint pains, bilateral lower extremity swelling, unhappy about receiving Protonix instead of Nexium  Objective: Filed Vitals:   01/23/16 0533 01/23/16 0914  BP:  117/42 161/55  Pulse: 76 83  Temp: 98 F (36.7 C)   Resp: 18     Intake/Output Summary (Last 24 hours) at 01/23/16 1328 Last data filed at 01/23/16 1158  Gross per 24 hour  Intake 6158.75 ml  Output    300 ml  Net 5858.75 ml   Filed Weights   01/21/16 0140  Weight: 76.658 kg (169 lb)    Exam:   General:  Pt in nad, alert and awake  Cardiovascular: no cyanosis  Respiratory: no increased wob, no wheezes  Abdomen: obese, ND, no guarding  Musculoskeletal: no cyanosis or clubbing   Data Reviewed: Basic Metabolic Panel:  Recent Labs Lab 01/21/16 0445 01/22/16 0616 01/23/16 0043  NA 139 139 138  K 3.6 3.5 3.7  CL 100* 106 109  CO2 21* 18* 17*  GLUCOSE 125* 180* 142*  BUN 61* 67* 56*  CREATININE 5.86* 5.10* 3.40*  CALCIUM 6.8* 6.7* 7.7*   Liver Function Tests:  Recent Labs Lab 01/21/16 0445  AST 30  ALT 17  ALKPHOS 68  BILITOT 0.9  PROT 5.6*  ALBUMIN 2.9*   No results for input(s): LIPASE, AMYLASE in the last 168 hours. No results for input(s): AMMONIA in the last 168 hours. CBC:  Recent Labs Lab 01/21/16 0445 01/22/16 1236  WBC 10.8* 7.1  HGB 8.7* 8.1*  HCT 26.7* 24.8*  MCV 89.3 87.9  PLT 223 179   Cardiac Enzymes: No results for input(s): CKTOTAL, CKMB, CKMBINDEX, TROPONINI in the last 168 hours. BNP (last 3 results) No results for input(s): BNP in the last 8760 hours.  ProBNP (last 3 results) No results for input(s): PROBNP in the last 8760 hours.  CBG:  Recent Labs Lab  01/23/16 0806 01/23/16 0837 01/23/16 0900 01/23/16 0953 01/23/16 1223  GLUCAP 62* 55* 56* 99 165*    Recent Results (from the past 240 hour(s))  MRSA PCR Screening     Status: None   Collection Time: 01/21/16  1:48 AM  Result Value Ref Range Status   MRSA by PCR NEGATIVE NEGATIVE Final    Comment:        The GeneXpert MRSA Assay (FDA approved for NASAL specimens only), is one component of a comprehensive MRSA colonization surveillance program. It is  not intended to diagnose MRSA infection nor to guide or monitor treatment for MRSA infections.      Studies: No results found.  Scheduled Meds: . allopurinol  100 mg Oral Daily  . antiseptic oral rinse  7 mL Mouth Rinse BID  . citalopram  40 mg Oral Daily  . colchicine  0.6 mg Oral Once  . ezetimibe-simvastatin  1 tablet Oral Daily  . furosemide  40 mg Oral Daily  . insulin aspart  0-15 Units Subcutaneous TID WC  . insulin aspart  0-5 Units Subcutaneous QHS  . insulin detemir  30 Units Subcutaneous QHS  . methylPREDNISolone (SOLU-MEDROL) injection  40 mg Intravenous Q12H  . metoprolol  100 mg Oral BID  . pantoprazole  40 mg Oral BID  . [START ON 01/24/2016] pantoprazole (PROTONIX) IV  40 mg Intravenous Q12H  . tiZANidine  8 mg Oral QHS   Continuous Infusions:     Time spent:> 35 minutes    [facility]  [facility] Pager [phone] If 7PM-7AM, please contact night-coverage at [email or web address], password [facility] 01/23/2016, 1:28 PM  LOS: 2 days

## 2016-01-23 NOTE — Progress Notes (Signed)
   01/23/16 0900  Clinical Encounter Type  Visited With Patient;Health care provider  Visit Type Initial;Psychological support;Spiritual support;Social support  Referral From Nurse;Care management  Consult/Referral To None  Spiritual Encounters  Spiritual Needs Emotional  Stress Factors  Patient Stress Factors Lack of knowledge   Chaplain visited Heather [name] to complete a Forensic scientist however, Pt. Already has a Forensic scientist. Chaplain left Advance directive paperwork just in case Pt wants to update her Advance Directive. Chaplain also gave some emotional via prayer. While Chaplain was visiting Pt, Pt said that she wanted MORE pain meds. Chaplain will let Nurse theresa know.   Thanks,  Marlou Sa.

## 2016-01-24 LAB — COMPREHENSIVE METABOLIC PANEL
ALK PHOS: 99 U/L (ref 38–126)
ALT: 16 U/L (ref 14–54)
ANION GAP: 7 (ref 5–15)
AST: 16 U/L (ref 15–41)
Albumin: 2.7 g/dL — ABNORMAL LOW (ref 3.5–5.0)
BUN: 40 mg/dL — ABNORMAL HIGH (ref 6–20)
CALCIUM: 9.1 mg/dL (ref 8.9–10.3)
CO2: 22 mmol/L (ref 22–32)
Chloride: 111 mmol/L (ref 101–111)
Creatinine, Ser: 2.17 mg/dL — ABNORMAL HIGH (ref 0.44–1.00)
GFR calc non Af Amer: 23 mL/min — ABNORMAL LOW (ref >60)
GFR, EST AFRICAN AMERICAN: 27 mL/min — AB (ref >60)
Glucose, Bld: 323 mg/dL — ABNORMAL HIGH (ref 65–99)
POTASSIUM: 4.7 mmol/L (ref 3.5–5.1)
SODIUM: 140 mmol/L (ref 135–145)
Total Bilirubin: 0.4 mg/dL (ref 0.3–1.2)
Total Protein: 5.7 g/dL — ABNORMAL LOW (ref 6.5–8.1)

## 2016-01-24 LAB — CBC
HCT: 27.4 % — ABNORMAL LOW (ref 36.0–46.0)
HEMOGLOBIN: 9.1 g/dL — AB (ref 12.0–15.0)
MCH: 29.1 pg (ref 26.0–34.0)
MCHC: 33.2 g/dL (ref 30.0–36.0)
MCV: 87.5 fL (ref 78.0–100.0)
PLATELETS: 199 10*3/uL (ref 150–400)
RBC: 3.13 MIL/uL — ABNORMAL LOW (ref 3.87–5.11)
RDW: 15.3 % (ref 11.5–15.5)
WBC: 6.2 10*3/uL (ref 4.0–10.5)

## 2016-01-24 LAB — GLUCOSE, CAPILLARY
GLUCOSE-CAPILLARY: 288 mg/dL — AB (ref 65–99)
GLUCOSE-CAPILLARY: 315 mg/dL — AB (ref 65–99)
GLUCOSE-CAPILLARY: 312 mg/dL — AB (ref 65–99)
Glucose-Capillary: 288 mg/dL — ABNORMAL HIGH (ref 65–99)
Glucose-Capillary: 256 mg/dL — ABNORMAL HIGH (ref 65–99)

## 2016-01-24 MED ORDER — OXYCODONE HCL 5 MG PO TABS
10.0000 mg | ORAL_TABLET | ORAL | Status: DC | PRN
  Administered 2016-01-25: 10 mg via ORAL

## 2016-01-24 MED ORDER — PREDNISONE 20 MG PO TABS
20.0000 mg | ORAL_TABLET | Freq: Every day | ORAL | Status: DC
  Administered 2016-01-24 – 2016-01-25 (×2): 20 mg via ORAL
  Filled 2016-01-24 (×2): qty 1

## 2016-01-24 MED ORDER — HYDRALAZINE HCL 20 MG/ML IJ SOLN
10.0000 mg | INTRAMUSCULAR | Status: DC | PRN
  Administered 2016-01-24 – 2016-01-25 (×2): 10 mg via INTRAVENOUS
  Filled 2016-01-24 (×2): qty 1

## 2016-01-24 MED ORDER — PREDNISOLONE 5 MG PO TABS: 20.0000 mg | ORAL_TABLET | Freq: Every day | ORAL | Status: DC

## 2016-01-24 MED ORDER — OXYCODONE HCL 5 MG PO TABS
10.0000 mg | ORAL_TABLET | ORAL | Status: DC | PRN
  Administered 2016-01-24 – 2016-01-25 (×5): 10 mg via ORAL
  Filled 2016-01-24 (×6): qty 2

## 2016-01-24 MED ORDER — HYDRALAZINE HCL 50 MG PO TABS
50.0000 mg | ORAL_TABLET | Freq: Three times a day (TID) | ORAL | Status: DC
  Administered 2016-01-24 – 2016-01-25 (×4): 50 mg via ORAL
  Filled 2016-01-24 (×4): qty 1

## 2016-01-24 MED ORDER — METHYLPREDNISOLONE SODIUM SUCC 40 MG IJ SOLR: 40.0000 mg | Freq: Every day | INTRAMUSCULAR | Status: DC

## 2016-01-24 MED ORDER — METOPROLOL TARTRATE 50 MG PO TABS
50.0000 mg | ORAL_TABLET | Freq: Two times a day (BID) | ORAL | Status: DC
  Administered 2016-01-24 – 2016-01-25 (×2): 50 mg via ORAL
  Filled 2016-01-24 (×2): qty 1

## 2016-01-24 MED ORDER — CITALOPRAM HYDROBROMIDE 20 MG PO TABS
20.0000 mg | ORAL_TABLET | Freq: Every day | ORAL | Status: DC
  Administered 2016-01-25: 20 mg via ORAL
  Filled 2016-01-24: qty 1

## 2016-01-24 NOTE — Care Management Important Message (Signed)
Important Message  Patient Details  Name: Heather [name] MRN: [id number] Date of Birth: May 28, 1951   Medicare Important Message Given:  Yes    Adylene Dlugosz P Ilay Capshaw 01/24/2016, 12:52 PM

## 2016-01-24 NOTE — Progress Notes (Signed)
[facility] PROGRESS NOTE  Heather [name] [id number] DOB: 01/05/1951 DOA: 01/21/2016 PCP: [name], MD    Brief narrative - 65 y/o that presented with coffee-ground emesis. GI consulted and patient was found to have prepyloric ulcer. GI on board and currently assisting with management.  Assessment/Plan:     Upper GI bleed -Secondary to prepyloric ulcer with small amount of bleeding seen. H pylori antibody negative Status post EGD, slowly advance to regular diet, Adamantly refusing carb  modified diet Very unhappy about Protonix, would like her Nexium, currently on Protonix twice a day Baseline hemoglobin around 10, hemoglobin, hemoglobin improving and trending up      Chronic Nausea and Vomiting - ? gastroapresis - Resolved    Essential hypertension-uncontrolled, Continue Lopressor, started on hydralazine, hold Cozaar    Diabetes mellitus ([place]) - Patient on Levemir and sliding scale insulin Elevated secondary to steroids, continue Levemir at the current dose as the patient is also receiving IV Solu-Medrol,      Gout With acute flare, uric acid 6.3 Given chronic kidney disease unable to get colchicine, therefore continue Solu-Medrol , reduce dose Also requesting Percocet to be increased to every 3 hours instead of every 4 hours    Acute renal failure superimposed on stage 3 chronic kidney disease (HCC) Baseline 1.4 Prerenal, Presented with a creatinine of 5.86, slowly improving now 2.17, improving Patient requested Lasix to be restarted due to bilateral lower extremity edema DC  in am if electrolytes and renal fn stable   Code Status: full Family Communication: discussed with husband [id number] Disposition Plan: PT evaluation still pending , anticipate discharge tomorrow   Consultants:  GI  Procedures:  None  Antibiotics:  None  HPI/Subjective: Continues to complain about her pain medications, but confused after receiving her muscle relaxant and  her Percocet at the same time. Continues to request medications every 3 hours, hypertensive  Objective: Filed Vitals:   01/23/16 2219 01/24/16 0527  BP: 178/70 183/74  Pulse: 79 69  Temp:  97.6 F (36.4 C)  Resp:  19    Intake/Output Summary (Last 24 hours) at 01/24/16 0932 Last data filed at 01/24/16 0810  Gross per 24 hour  Intake      0 ml  Output   1100 ml  Net  -1100 ml   Filed Weights   01/21/16 0140  Weight: 76.658 kg (169 lb)    Exam:   General:  Pt in nad, alert and awake  Cardiovascular: no cyanosis  Respiratory: no increased wob, no wheezes  Abdomen: obese, ND, no guarding  Musculoskeletal: no cyanosis or clubbing   Data Reviewed: Basic Metabolic Panel:  Recent Labs Lab 01/21/16 0445 01/22/16 0616 01/23/16 0043 01/24/16 0548  NA 139 139 138 140  K 3.6 3.5 3.7 4.7  CL 100* 106 109 111  CO2 21* 18* 17* 22  GLUCOSE 125* 180* 142* 323*  BUN 61* 67* 56* 40*  CREATININE 5.86* 5.10* 3.40* 2.17*  CALCIUM 6.8* 6.7* 7.7* 9.1   Liver Function Tests:  Recent Labs Lab 01/21/16 0445 01/24/16 0548  AST 30 16  ALT 17 16  ALKPHOS 68 99  BILITOT 0.9 0.4  PROT 5.6* 5.7*  ALBUMIN 2.9* 2.7*   No results for input(s): LIPASE, AMYLASE in the last 168 hours. No results for input(s): AMMONIA in the last 168 hours. CBC:  Recent Labs Lab 01/21/16 0445 01/22/16 1236 01/23/16 1449 01/24/16 0548  WBC 10.8* 7.1 6.0 6.2  HGB 8.7* 8.1* 8.5* 9.1*  HCT 26.7*  24.8* 26.9* 27.4*  MCV 89.3 87.9 88.5 87.5  PLT 223 179 200 199   Cardiac Enzymes: No results for input(s): CKTOTAL, CKMB, CKMBINDEX, TROPONINI in the last 168 hours. BNP (last 3 results) No results for input(s): BNP in the last 8760 hours.  ProBNP (last 3 results) No results for input(s): PROBNP in the last 8760 hours.  CBG:  Recent Labs Lab 01/23/16 0953 01/23/16 1223 01/23/16 1712 01/23/16 2106 01/24/16 0806  GLUCAP 99 165* 229* 288* 288*    Recent Results (from the past 240  hour(s))  MRSA PCR Screening     Status: None   Collection Time: 01/21/16  1:48 AM  Result Value Ref Range Status   MRSA by PCR NEGATIVE NEGATIVE Final    Comment:        The GeneXpert MRSA Assay (FDA approved for NASAL specimens only), is one component of a comprehensive MRSA colonization surveillance program. It is not intended to diagnose MRSA infection nor to guide or monitor treatment for MRSA infections.      Studies: No results found.  Scheduled Meds: . allopurinol  100 mg Oral Daily  . antiseptic oral rinse  7 mL Mouth Rinse BID  . citalopram  40 mg Oral Daily  . colchicine  0.6 mg Oral Once  . ezetimibe-simvastatin  1 tablet Oral Daily  . furosemide  20 mg Oral Daily  . hydrALAZINE  50 mg Oral 3 times per day  . insulin aspart  0-15 Units Subcutaneous TID WC  . insulin aspart  0-5 Units Subcutaneous QHS  . insulin detemir  25 Units Subcutaneous QHS  . methylPREDNISolone (SOLU-MEDROL) injection  40 mg Intravenous Daily  . metoprolol  100 mg Oral BID  . pantoprazole  40 mg Oral BID  . tiZANidine  8 mg Oral QHS   Continuous Infusions:     Time spent:> 35 minutes    [facility]  [facility] Pager [phone] If 7PM-7AM, please contact night-coverage at [email or web address], password [facility] 01/24/2016, 9:32 AM  LOS: 3 days

## 2016-01-24 NOTE — Evaluation (Signed)
Physical Therapy Evaluation Patient Details Name: Heather [name] MRN: [id number] DOB: Jun 11, 1951 Today's Date: 01/24/2016   History of Present Illness  65 yo female with onset of GI bleed with suspected tear, has prepyloric ulcer and hx gastroparesis.  PMHx:  DM, gout, acute renal failure, CKD 3,  (OA, hypoglycemia)  Clinical Impression  Pt was assisted to walk and to get to the chair with some discomfort in neck.  Her plan is to get home but to receive HHPT and walker to get around, but to assess her balance and strength as she goes.    Follow Up Recommendations Home health PT;Supervision/Assistance - 24 hour    Equipment Recommendations  None recommended by PT    Recommendations for Other Services Rehab consult     Precautions / Restrictions Precautions Precautions: Fall (telemetry) Restrictions Weight Bearing Restrictions: No      Mobility  Bed Mobility Overal bed mobility: Needs Assistance Bed Mobility: Supine to Sit     Supine to sit: Min guard;Min assist     General bed mobility comments: assist to lift trunk and pt can pivot her hips  Transfers Overall transfer level: Needs assistance Equipment used: Rolling walker (2 wheeled);1 person hand held assist Transfers: Sit to/from [facility] Sit to Stand: Min assist;Min guard Stand pivot transfers: Min guard;Min assist       General transfer comment: reminders for hand placement and set up to sit  Ambulation/Gait Ambulation/Gait assistance: Min guard Ambulation Distance (Feet): 150 Feet Assistive device: Rolling walker (2 wheeled) Gait Pattern/deviations: Step-through pattern;Trunk flexed;Narrow base of support Gait velocity: reduced Gait velocity interpretation: Below normal speed for age/gender    Stairs            Wheelchair Mobility    Modified Rankin (Stroke Patients Only)       Balance Overall balance assessment: Needs assistance Sitting-balance support: Feet  supported Sitting balance-Leahy Scale: Good   Postural control: Posterior lean Standing balance support: Bilateral upper extremity supported Standing balance-Leahy Scale: Fair                               Pertinent Vitals/Pain Pain Assessment: 0-10 Pain Score: 6  Pain Location: neck  Pain Descriptors / Indicators: Aching;Operative site guarding Pain Intervention(s): Limited activity within patient's tolerance;Premedicated before session    [place] expects to be discharged to:: Private residence Living Arrangements: Spouse/significant other Available Help at Discharge: Family;Available 24 hours/day Type of Home: House Home Access: Stairs to enter Entrance Stairs-Rails: Right;Left;Can reach both Entrance Stairs-Number of Steps: 4 Home Layout: One level Home Equipment: Cane - single point      Prior Function Level of Independence: Independent               Hand Dominance        Extremity/Trunk Assessment   Upper Extremity Assessment: Overall WFL for tasks assessed           Lower Extremity Assessment: Generalized weakness      Cervical / Trunk Assessment: Normal  Communication   Communication: No difficulties  Cognition Arousal/Alertness: Awake/alert Behavior During Therapy: WFL for tasks assessed/performed Overall Cognitive Status: Within Functional Limits for tasks assessed                      General Comments General comments (skin integrity, edema, etc.): Pt is uncomfortable with her neck surgery and was improved by being OOB with spinal support  in chair.  Has neutral alignment and good tolerance for sitting for breakfast    Exercises        Assessment/Plan    PT Assessment Patient needs continued PT services  PT Diagnosis Difficulty walking;Acute pain   PT Problem List Decreased strength;Decreased range of motion;Decreased activity tolerance;Decreased balance;Decreased mobility;Decreased  coordination;Decreased knowledge of use of DME;Decreased safety awareness;Cardiopulmonary status limiting activity;Obesity;Pain;Decreased skin integrity  PT Treatment Interventions DME instruction;Gait training;Stair training;Functional mobility training;Therapeutic activities;Therapeutic exercise;Balance training;Neuromuscular re-education;Patient/family education   PT Goals (Current goals can be found in the Care Plan section) Acute Rehab PT Goals Patient Stated Goal: to walk and get home with better sleep PT Goal Formulation: With patient Time For Goal Achievement: 02/07/16 Potential to Achieve Goals: Good    Frequency Min 3X/week   Barriers to discharge Inaccessible home environment stairs to enter house    Co-evaluation               End of Session Equipment Utilized During Treatment: Gait belt Activity Tolerance: Patient tolerated treatment well;Patient limited by fatigue;Patient limited by pain Patient left: in chair;with call bell/phone within reach Nurse Communication: Mobility status (up with no chair alarm)         Time: [id number] PT Time Calculation (min) (ACUTE ONLY): 24 min   Charges:   PT Evaluation $PT Eval Low Complexity: 1 Procedure PT Treatments $Gait Training: 8-22 mins   PT G CodesRamond Dial February 09, 2016, 1:47 PM   Mee Hives, PT MS Acute Rehab Dept. Number: [facility] [phone] and [place] [phone]

## 2016-01-24 NOTE — Progress Notes (Signed)
Patient's Husband brought patient's Oxycodone HCL 10mg  Q4 PRN pain medication bottle to hospital to inform MD. Date filled 01/19/16. PRN for post op pain control. Dr. Allyson Sabal paged to make aware. PTA med list updated.

## 2016-01-24 NOTE — Progress Notes (Addendum)
BP 210/85 manual check verified by two nurses. HR 89. Patient denies associated symptoms. Dr. Allyson Sabal paged to make aware.   5:17 PM Patient called RN stating she has a headache now. Patient stated 6/10 and "it just started all of a sudden". MD placed orders.   5:46 PM BP 180/58 manual check post PRN hydralazine order. Patient stated her headache "it's there, but it is not like it was".

## 2016-01-25 DIAGNOSIS — N189 Chronic kidney disease, unspecified: Secondary | ICD-10-CM | POA: Diagnosis present

## 2016-01-25 DIAGNOSIS — N179 Acute kidney failure, unspecified: Secondary | ICD-10-CM | POA: Diagnosis present

## 2016-01-25 LAB — COMPREHENSIVE METABOLIC PANEL
ALBUMIN: 2.9 g/dL — AB (ref 3.5–5.0)
ALK PHOS: 94 U/L (ref 38–126)
ALT: 14 U/L (ref 14–54)
AST: 14 U/L — AB (ref 15–41)
Anion gap: 8 (ref 5–15)
BUN: 33 mg/dL — AB (ref 6–20)
CALCIUM: 9.5 mg/dL (ref 8.9–10.3)
CO2: 24 mmol/L (ref 22–32)
CREATININE: 1.7 mg/dL — AB (ref 0.44–1.00)
Chloride: 110 mmol/L (ref 101–111)
GFR calc Af Amer: 36 mL/min — ABNORMAL LOW (ref >60)
GFR calc non Af Amer: 31 mL/min — ABNORMAL LOW (ref >60)
GLUCOSE: 189 mg/dL — AB (ref 65–99)
Potassium: 3.8 mmol/L (ref 3.5–5.1)
SODIUM: 142 mmol/L (ref 135–145)
TOTAL PROTEIN: 6 g/dL — AB (ref 6.5–8.1)
Total Bilirubin: 0.6 mg/dL (ref 0.3–1.2)

## 2016-01-25 LAB — GLUCOSE, CAPILLARY
GLUCOSE-CAPILLARY: 298 mg/dL — AB (ref 65–99)
Glucose-Capillary: 146 mg/dL — ABNORMAL HIGH (ref 65–99)

## 2016-01-25 LAB — CBC
HCT: 28.6 % — ABNORMAL LOW (ref 36.0–46.0)
HEMOGLOBIN: 9 g/dL — AB (ref 12.0–15.0)
MCH: 27.4 pg (ref 26.0–34.0)
MCHC: 31.5 g/dL (ref 30.0–36.0)
MCV: 87.2 fL (ref 78.0–100.0)
Platelets: 295 10*3/uL (ref 150–400)
RBC: 3.28 MIL/uL — AB (ref 3.87–5.11)
RDW: 15.4 % (ref 11.5–15.5)
WBC: 12.3 10*3/uL — AB (ref 4.0–10.5)

## 2016-01-25 MED ORDER — PANTOPRAZOLE SODIUM 40 MG PO TBEC: 40.0000 mg | DELAYED_RELEASE_TABLET | Freq: Two times a day (BID) | ORAL | Status: DC

## 2016-01-25 MED ORDER — HYDRALAZINE HCL 50 MG PO TABS: 50.0000 mg | ORAL_TABLET | Freq: Three times a day (TID) | ORAL | Status: DC

## 2016-01-25 MED ORDER — INSULIN DETEMIR 100 UNIT/ML ~~LOC~~ SOLN: 60.0000 [IU] | Freq: Every day | SUBCUTANEOUS | Status: DC

## 2016-01-25 MED ORDER — FUROSEMIDE 40 MG PO TABS: 40.0000 mg | ORAL_TABLET | Freq: Every day | ORAL | Status: DC

## 2016-01-25 NOTE — Care Management Note (Signed)
Case Management Note  Patient Details  Name: Heather [name] MRN: [id number] Date of Birth: 11/09/1950  Subjective/Objective:                 Spoke with patient in the room. She states she had back surgery 2 1/2 weeks ago. She is active with [facility] for [facility] PT. She states that she has a walker at home. She lives at home with her husband. Patient c/o pain and swelling to feet and right elbow stating she has gout and this happens every time she is in the hospital, MD at bedside to examine.     Action/Plan:  DC to home today with [facility] PT.  Expected Discharge Date:                  Expected Discharge Plan:  [place]  In-House Referral:     Discharge planning Services  CM Consult  Post Acute Care Choice:  Resumption of Svcs/PTA Provider, Home Health Choice offered to:  Patient  DME Arranged:    DME Agency:     HH Arranged:    [place] Agency:     Status of Service:  In process, will continue to follow  Medicare Important Message Given:  Yes Date Medicare IM Given:    Medicare IM give by:    Date Additional Medicare IM Given:    Additional Medicare Important Message give by:     If discussed at [place] of Stay Meetings, dates discussed:    Additional Comments:  Carles Collet, RN 01/25/2016, 11:39 AM

## 2016-01-25 NOTE — Discharge Summary (Signed)
Physician Discharge Summary  ULLA MCKIERNAN MRN: [id number] DOB/AGE: [birth year] 65 y.o.  PCP: [name], MD   Admit date: 01/21/2016 Discharge date: 01/25/2016  Discharge Diagnoses:  Principal Problem:   Upper GI bleed Active Problems:   Chronic Nausea and Vomiting - ? gastroapresis   Essential hypertension   Diabetes mellitus (HCC)   Gout   Coffee ground emesis   Hematemesis   Melena   Acute renal failure superimposed on stage 3 chronic kidney disease (HCC)    Follow-up recommendations Follow-up with PCP in 3-5 days , including all  additional recommended appointments as below Follow-up CBC, CMP in 3-5 days      Medication List    STOP taking these medications        indomethacin 50 MG capsule  Commonly known as:  INDOCIN     potassium chloride SA 20 MEQ tablet  Commonly known as:  K-DUR,KLOR-CON      TAKE these medications        allopurinol 100 MG tablet  Commonly known as:  ZYLOPRIM  Take 100 mg by mouth daily.     cholecalciferol 1000 units tablet  Commonly known as:  VITAMIN D  Take 1,000 Units by mouth daily.     citalopram 20 MG tablet  Commonly known as:  CELEXA  Take 20 mg by mouth daily.     cyanocobalamin 1000 MCG/ML injection  Commonly known as:  (VITAMIN B-12)  Inject 1,000 mcg into the muscle every 30 (thirty) days.     esomeprazole 40 MG capsule  Commonly known as:  NEXIUM  Take 40 mg by mouth daily before breakfast.     furosemide 40 MG tablet  Commonly known as:  LASIX  Take 1 tablet (40 mg total) by mouth daily.  Start taking on:  01/26/2016     HUMALOG KWIKPEN 100 UNIT/ML KiwkPen  Generic drug:  insulin lispro  Inject 10-50 Units into the skin 3 (three) times daily with meals. <70= no coverage 70-150= 10 units 151-200=15units 201-250=20units 251-300=25units 301-350=30units 351-400=35units 401-450=40units >450= 50 units     hydrALAZINE 50 MG tablet  Commonly known as:  APRESOLINE  Take 1 tablet (50 mg total) by mouth  every 8 (eight) hours.     insulin detemir 100 UNIT/ML injection  Commonly known as:  LEVEMIR  Inject 0.6 mLs (60 Units total) into the skin at bedtime.     IRON 100 PLUS PO  Take 1 capsule by mouth daily.     metoprolol 100 MG tablet  Commonly known as:  LOPRESSOR  Take 100 mg by mouth 2 (two) times daily. Patient states she is taking the losartan as well     oxyCODONE 5 MG immediate release tablet  Commonly known as:  Oxy IR/ROXICODONE  Take 10 mg by mouth every 4 (four) hours as needed for severe pain.     pantoprazole 40 MG tablet  Commonly known as:  PROTONIX  Take 1 tablet (40 mg total) by mouth 2 (two) times daily.     tiZANidine 4 MG tablet  Commonly known as:  ZANAFLEX  Take 8 mg by mouth at bedtime as needed (for sleep, does not take with pain medication).         Discharge Condition: Stable   Discharge Instructions Get Medicines reviewed and adjusted: Please take all your medications with you for your next visit with your Primary MD  Please request your Primary MD to go over all hospital tests and procedure/radiological results at the follow  up, please ask your Primary MD to get all Hospital records sent to his/her office.  If you experience worsening of your admission symptoms, develop shortness of breath, life threatening emergency, suicidal or homicidal thoughts you must seek medical attention immediately by calling 911 or calling your MD immediately if symptoms less severe.  You must read complete instructions/literature along with all the possible adverse reactions/side effects for all the Medicines you take and that have been prescribed to you. Take any new Medicines after you have completely understood and accpet all the possible adverse reactions/side effects.   Do not drive when taking Pain medications.   Do not take more than prescribed Pain, Sleep and Anxiety Medications  Special Instructions: If you have smoked or chewed Tobacco in the last 2 yrs  please stop smoking, stop any regular Alcohol and or any Recreational drug use.  Wear Seat belts while driving.  Please note  You were cared for by a hospitalist during your hospital stay. Once you are discharged, your primary care physician will handle any further medical issues. Please note that NO REFILLS for any discharge medications will be authorized once you are discharged, as it is imperative that you return to your primary care physician (or establish a relationship with a primary care physician if you do not have one) for your aftercare needs so that they can reassess your need for medications and monitor your lab values.    Allergies  Allergen Reactions  . Duloxetine Hcl Swelling  . Fluocinolone Swelling  . Amlodipine Besylate     Swelling of feet, fluid retention  . Ciprofloxacin Nausea And Vomiting and Swelling    Swelling of face, jaw, and lips  . Hydrocodone-Acetaminophen Itching  . Metformin Other (See Comments)    REACTION: GI UPSET  . Quinolones Swelling  . Sulfamethoxazole Swelling    Swelling of feet, legs  . Sulfonamide Derivatives     Swelling   . Valsartan Swelling  . Lisinopril Swelling and Rash    Oral swelling, and red streaks on arms/stomach      Disposition: 01-Home or Self Care   Consults:  Gastroenterology         Filed Weights   01/21/16 0140  Weight: 76.658 kg (169 lb)     Microbiology: Recent Results (from the past 240 hour(s))  MRSA PCR Screening     Status: None   Collection Time: 01/21/16  1:48 AM  Result Value Ref Range Status   MRSA by PCR NEGATIVE NEGATIVE Final    Comment:        The GeneXpert MRSA Assay (FDA approved for NASAL specimens only), is one component of a comprehensive MRSA colonization surveillance program. It is not intended to diagnose MRSA infection nor to guide or monitor treatment for MRSA infections.        Blood Culture No results found for: SDES, Lyndon, CULT,  REPTSTATUS    Labs: Results for orders placed or performed during the hospital encounter of 01/21/16 (from the past 48 hour(s))  Glucose, capillary     Status: None   Collection Time: 01/23/16  9:53 AM  Result Value Ref Range   Glucose-Capillary 99 65 - 99 mg/dL  Glucose, capillary     Status: Abnormal   Collection Time: 01/23/16 12:23 PM  Result Value Ref Range   Glucose-Capillary 165 (H) 65 - 99 mg/dL  Uric acid     Status: None   Collection Time: 01/23/16  2:49 PM  Result Value Ref Range  Uric Acid, Serum 6.3 2.3 - 6.6 mg/dL  CBC     Status: Abnormal   Collection Time: 01/23/16  2:49 PM  Result Value Ref Range   WBC 6.0 4.0 - 10.5 K/uL   RBC 3.04 (L) 3.87 - 5.11 MIL/uL   Hemoglobin 8.5 (L) 12.0 - 15.0 g/dL   HCT 26.9 (L) 36.0 - 46.0 %   MCV 88.5 78.0 - 100.0 fL   MCH 28.0 26.0 - 34.0 pg   MCHC 31.6 30.0 - 36.0 g/dL   RDW 15.6 (H) 11.5 - 15.5 %   Platelets 200 150 - 400 K/uL  Glucose, capillary     Status: Abnormal   Collection Time: 01/23/16  5:12 PM  Result Value Ref Range   Glucose-Capillary 229 (H) 65 - 99 mg/dL  Glucose, capillary     Status: Abnormal   Collection Time: 01/23/16  9:06 PM  Result Value Ref Range   Glucose-Capillary 288 (H) 65 - 99 mg/dL  CBC     Status: Abnormal   Collection Time: 01/24/16  5:48 AM  Result Value Ref Range   WBC 6.2 4.0 - 10.5 K/uL   RBC 3.13 (L) 3.87 - 5.11 MIL/uL   Hemoglobin 9.1 (L) 12.0 - 15.0 g/dL   HCT 27.4 (L) 36.0 - 46.0 %   MCV 87.5 78.0 - 100.0 fL   MCH 29.1 26.0 - 34.0 pg   MCHC 33.2 30.0 - 36.0 g/dL   RDW 15.3 11.5 - 15.5 %   Platelets 199 150 - 400 K/uL  Comprehensive metabolic panel     Status: Abnormal   Collection Time: 01/24/16  5:48 AM  Result Value Ref Range   Sodium 140 135 - 145 mmol/L   Potassium 4.7 3.5 - 5.1 mmol/L    Comment: DELTA CHECK NOTED   Chloride 111 101 - 111 mmol/L   CO2 22 22 - 32 mmol/L   Glucose, Bld 323 (H) 65 - 99 mg/dL   BUN 40 (H) 6 - 20 mg/dL   Creatinine, Ser 2.17 (H) 0.44 -  1.00 mg/dL    Comment: DELTA CHECK NOTED   Calcium 9.1 8.9 - 10.3 mg/dL   Total Protein 5.7 (L) 6.5 - 8.1 g/dL   Albumin 2.7 (L) 3.5 - 5.0 g/dL   AST 16 15 - 41 U/L   ALT 16 14 - 54 U/L   Alkaline Phosphatase 99 38 - 126 U/L   Total Bilirubin 0.4 0.3 - 1.2 mg/dL   GFR calc non Af Amer 23 (L) >60 mL/min   GFR calc Af Amer 27 (L) >60 mL/min    Comment: (NOTE) The eGFR has been calculated using the CKD EPI equation. This calculation has not been validated in all clinical situations. eGFR's persistently <60 mL/min signify possible Chronic Kidney Disease.    Anion gap 7 5 - 15  Glucose, capillary     Status: Abnormal   Collection Time: 01/24/16  8:06 AM  Result Value Ref Range   Glucose-Capillary 288 (H) 65 - 99 mg/dL  Glucose, capillary     Status: Abnormal   Collection Time: 01/24/16 12:04 PM  Result Value Ref Range   Glucose-Capillary 256 (H) 65 - 99 mg/dL  Glucose, capillary     Status: Abnormal   Collection Time: 01/24/16  4:49 PM  Result Value Ref Range   Glucose-Capillary 315 (H) 65 - 99 mg/dL  Glucose, capillary     Status: Abnormal   Collection Time: 01/24/16  8:32 PM  Result Value Ref  Range   Glucose-Capillary 312 (H) 65 - 99 mg/dL  Glucose, capillary     Status: Abnormal   Collection Time: 01/24/16 10:48 PM  Result Value Ref Range   Glucose-Capillary 298 (H) 65 - 99 mg/dL   Comment 1 Notify RN    Comment 2 Document in Chart   CBC     Status: Abnormal   Collection Time: 01/25/16  5:44 AM  Result Value Ref Range   WBC 12.3 (H) 4.0 - 10.5 K/uL   RBC 3.28 (L) 3.87 - 5.11 MIL/uL   Hemoglobin 9.0 (L) 12.0 - 15.0 g/dL   HCT 28.6 (L) 36.0 - 46.0 %   MCV 87.2 78.0 - 100.0 fL   MCH 27.4 26.0 - 34.0 pg   MCHC 31.5 30.0 - 36.0 g/dL   RDW 15.4 11.5 - 15.5 %   Platelets 295 150 - 400 K/uL  Comprehensive metabolic panel     Status: Abnormal   Collection Time: 01/25/16  5:44 AM  Result Value Ref Range   Sodium 142 135 - 145 mmol/L   Potassium 3.8 3.5 - 5.1 mmol/L    Chloride 110 101 - 111 mmol/L   CO2 24 22 - 32 mmol/L   Glucose, Bld 189 (H) 65 - 99 mg/dL   BUN 33 (H) 6 - 20 mg/dL   Creatinine, Ser 1.70 (H) 0.44 - 1.00 mg/dL   Calcium 9.5 8.9 - 10.3 mg/dL   Total Protein 6.0 (L) 6.5 - 8.1 g/dL   Albumin 2.9 (L) 3.5 - 5.0 g/dL   AST 14 (L) 15 - 41 U/L   ALT 14 14 - 54 U/L   Alkaline Phosphatase 94 38 - 126 U/L   Total Bilirubin 0.6 0.3 - 1.2 mg/dL   GFR calc non Af Amer 31 (L) >60 mL/min   GFR calc Af Amer 36 (L) >60 mL/min    Comment: (NOTE) The eGFR has been calculated using the CKD EPI equation. This calculation has not been validated in all clinical situations. eGFR's persistently <60 mL/min signify possible Chronic Kidney Disease.    Anion gap 8 5 - 15  Glucose, capillary     Status: Abnormal   Collection Time: 01/25/16  8:16 AM  Result Value Ref Range   Glucose-Capillary 146 (H) 65 - 99 mg/dL     Lipid Panel  No results found for: CHOL, TRIG, HDL, CHOLHDL, VLDL, LDLCALC, LDLDIRECT   Lab Results  Component Value Date   HGBA1C 9.0* 12/22/2014     Lab Results  Component Value Date   CREATININE 1.70* 01/25/2016     HPI :*65 y.o. female with multiple medical problems transferred from [facility] due to a GI bleed and lack of GI coverage there. Report of black stools and coffee grounds emesis that she does not recall. Reports constant upper abdominal pain that is dull and crampy. Daily heartburn that is relieved with Nexium. Reports history of a hiatal hernia. 20 pound unintentional weight loss in the past month. No history of hematochezia or hematemesis. Thinks she had a colonoscopy 2 years ago in [place] but does not know the results. Chart review shows a colonoscopy by Dr. Carlean Purl in 2012 that showed ileal erosions possibly from NSAIDs. Reports having neck surgery 2 weeks ago and has severe pain in her back when she lies flat. History of recurrent vomiting in the setting of diabetes. Hgb 8.7.  HOSPITAL COURSE:   Upper GI  bleed -Secondary to prepyloric ulcer with small amount of bleeding seen.  H pylori antibody negative Status post EGD, slowly advance to regular diet, Adamantly refusing carb modified diet currently on Protonix twice a day Baseline hemoglobin around 10, hemoglobin, hemoglobin improving and trending up Follow CBC closely at the time of discharge   Chronic Nausea and Vomiting - ? gastroapresis - Resolved   Essential hypertension-uncontrolled, Continue Lopressor, started on hydralazine, hold Cozaar   Diabetes mellitus ([place]) - Patient on Lantus and sliding scale insulin, last hemoglobin A1c 9.0 Reduce home dose of Lantus from 90->65 units until oral intake is fully established   Gout With acute flare, uric acid 6.3 Given chronic kidney disease unable to get colchicine, given Solu-Medrol , reduce dose Also requesting Percocet to be increased to every 3 hours instead of every 4 hours   Acute renal failure superimposed on stage 3 chronic kidney disease (HCC) Baseline 1.4 Prerenal, Presented with a creatinine of 5.86, slowly improving now 1.7, improving Patient requested Lasix to be restarted due to bilateral lower extremity edema Follow electrolytes closely the outpatient setting    Discharge Exam: *  Blood pressure 167/58, pulse 88, temperature 97.9 F (36.6 C), temperature source Oral, resp. rate 18, height '5\' 3"'  (1.6 m), weight 76.658 kg (169 lb), SpO2 97 %.  General: Alert, Well-developed, well-nourished, no acute distress Head: atraumatic Eyes: pupils equal and reactive ENT: oropharynx clear Neck: surgical incision noted posteriorly Lungs: Clear throughout to auscultation. No wheezes, crackles, or rhonchi. No acute distress. Heart: Regular rate and rhythm; no murmurs, clicks, rubs, or gallops. Abdomen: upper quadrant tenderness with guarding, soft, nondistended, +BS         Follow-up Information    Follow up with [facility].   Why:   resume PT    Contact information:   [place] [address] [phone]       Follow up with [facility], MD. Schedule an appointment as soon as possible for a visit in 3 days.   Specialty:  Internal Medicine   Contact information:   [place] [address] [phone]       Signed: Reyne Dumas 01/25/2016, 9:31 AM        Time spent >45 mins

## 2016-01-25 NOTE — Progress Notes (Addendum)
Nsg Discharge Note  Admit Date:  01/21/2016 Discharge date: 01/25/2016   [name] to be D/C'd Home per MD order.  AVS completed.  Copy for chart, and copy for patient signed, and dated. Patient/caregiver able to verbalize understanding.  Discharge Medication:   Medication List    STOP taking these medications        indomethacin 50 MG capsule  Commonly known as:  INDOCIN     potassium chloride SA 20 MEQ tablet  Commonly known as:  K-DUR,KLOR-CON      TAKE these medications        allopurinol 100 MG tablet  Commonly known as:  ZYLOPRIM  Take 100 mg by mouth daily.     cholecalciferol 1000 units tablet  Commonly known as:  VITAMIN D  Take 1,000 Units by mouth daily.     citalopram 20 MG tablet  Commonly known as:  CELEXA  Take 20 mg by mouth daily.     cyanocobalamin 1000 MCG/ML injection  Commonly known as:  (VITAMIN B-12)  Inject 1,000 mcg into the muscle every 30 (thirty) days.     esomeprazole 40 MG capsule  Commonly known as:  NEXIUM  Take 40 mg by mouth daily before breakfast.     furosemide 40 MG tablet  Commonly known as:  LASIX  Take 1 tablet (40 mg total) by mouth daily.  Start taking on:  01/26/2016     HUMALOG KWIKPEN 100 UNIT/ML KiwkPen  Generic drug:  insulin lispro  Inject 10-50 Units into the skin 3 (three) times daily with meals. <70= no coverage 70-150= 10 units 151-200=15units 201-250=20units 251-300=25units 301-350=30units 351-400=35units 401-450=40units >450= 50 units     hydrALAZINE 50 MG tablet  Commonly known as:  APRESOLINE  Take 1 tablet (50 mg total) by mouth every 8 (eight) hours.     insulin detemir 100 UNIT/ML injection  Commonly known as:  LEVEMIR  Inject 0.6 mLs (60 Units total) into the skin at bedtime.     IRON 100 PLUS PO  Take 1 capsule by mouth daily.     metoprolol 100 MG tablet  Commonly known as:  LOPRESSOR  Take 100 mg by mouth 2 (two) times daily. Patient states she is taking the losartan as well     oxyCODONE 5 MG immediate release tablet  Commonly known as:  Oxy IR/ROXICODONE  Take 10 mg by mouth every 4 (four) hours as needed for severe pain.     pantoprazole 40 MG tablet  Commonly known as:  PROTONIX  Take 1 tablet (40 mg total) by mouth 2 (two) times daily.     tiZANidine 4 MG tablet  Commonly known as:  ZANAFLEX  Take 8 mg by mouth at bedtime as needed (for sleep, does not take with pain medication).        Discharge Assessment: Filed Vitals:   01/25/16 0451 01/25/16 0843  BP: 135/56 167/58  Pulse: 86 88  Temp: 97.9 F (36.6 C)   Resp: 18    Skin clean, dry and intact without evidence of skin break down, no evidence of skin tears noted. Does have posterior neck incision with sutures.  IV catheter discontinued intact. Site without signs and symptoms of complications - no redness or edema noted at insertion site, patient denies c/o pain - only slight tenderness at site.  Dressing with slight pressure applied.  D/c Instructions-Education: Discharge instructions given to patient/family with verbalized understanding. D/c education completed with patient/family including follow up instructions, medication list,  d/c activities limitations if indicated, with other d/c instructions as indicated by MD - patient able to verbalize understanding, all questions fully answered. Patient instructed to return to ED, call 911, or call MD for any changes in condition.  Patient escorted via [place], and D/C home via private auto.  Ramia Sidney Margaretha Sheffield, RN 01/25/2016 11:48 AM

## 2016-07-17 ENCOUNTER — Encounter (HOSPITAL_COMMUNITY): Payer: Self-pay | Admitting: Emergency Medicine

## 2016-07-17 ENCOUNTER — Emergency Department (HOSPITAL_COMMUNITY): Payer: Medicare Other

## 2016-07-17 ENCOUNTER — Emergency Department (HOSPITAL_COMMUNITY)
Admission: EM | Admit: 2016-07-17 | Discharge: 2016-07-17 | Disposition: A | Discharge status: Home / Self Care | Payer: Medicare Other | Attending: Emergency Medicine | Admitting: Emergency Medicine

## 2016-07-17 DIAGNOSIS — E1122 Type 2 diabetes mellitus with diabetic chronic kidney disease: Secondary | ICD-10-CM | POA: Insufficient documentation

## 2016-07-17 DIAGNOSIS — R112 Nausea with vomiting, unspecified: Secondary | ICD-10-CM | POA: Diagnosis not present

## 2016-07-17 DIAGNOSIS — R197 Diarrhea, unspecified: Secondary | ICD-10-CM | POA: Insufficient documentation

## 2016-07-17 DIAGNOSIS — R109 Unspecified abdominal pain: Secondary | ICD-10-CM | POA: Insufficient documentation

## 2016-07-17 DIAGNOSIS — I129 Hypertensive chronic kidney disease with stage 1 through stage 4 chronic kidney disease, or unspecified chronic kidney disease: Secondary | ICD-10-CM | POA: Diagnosis not present

## 2016-07-17 DIAGNOSIS — N183 Chronic kidney disease, stage 3 (moderate): Secondary | ICD-10-CM | POA: Diagnosis not present

## 2016-07-17 DIAGNOSIS — Z794 Long term (current) use of insulin: Secondary | ICD-10-CM | POA: Insufficient documentation

## 2016-07-17 LAB — COMPREHENSIVE METABOLIC PANEL
ALBUMIN: 4.7 g/dL (ref 3.5–5.0)
ALK PHOS: 129 U/L — AB (ref 38–126)
ALT: 47 U/L (ref 14–54)
ANION GAP: 14 (ref 5–15)
AST: 38 U/L (ref 15–41)
BUN: 31 mg/dL — ABNORMAL HIGH (ref 6–20)
CALCIUM: 10.2 mg/dL (ref 8.9–10.3)
CO2: 21 mmol/L — AB (ref 22–32)
CREATININE: 1.75 mg/dL — AB (ref 0.44–1.00)
Chloride: 101 mmol/L (ref 101–111)
GFR calc Af Amer: 34 mL/min — ABNORMAL LOW (ref >60)
GFR calc non Af Amer: 30 mL/min — ABNORMAL LOW (ref >60)
GLUCOSE: 107 mg/dL — AB (ref 65–99)
Potassium: 4.2 mmol/L (ref 3.5–5.1)
SODIUM: 136 mmol/L (ref 135–145)
Total Bilirubin: 0.4 mg/dL (ref 0.3–1.2)
Total Protein: 8.6 g/dL — ABNORMAL HIGH (ref 6.5–8.1)

## 2016-07-17 LAB — URINALYSIS, ROUTINE W REFLEX MICROSCOPIC
BILIRUBIN URINE: NEGATIVE
Glucose, UA: NEGATIVE mg/dL
HGB URINE DIPSTICK: NEGATIVE
Ketones, ur: NEGATIVE mg/dL
Nitrite: NEGATIVE
Protein, ur: NEGATIVE mg/dL
SPECIFIC GRAVITY, URINE: 1.015 (ref 1.005–1.030)
pH: 5.5 (ref 5.0–8.0)

## 2016-07-17 LAB — CBC
HCT: 38.1 % (ref 36.0–46.0)
HEMOGLOBIN: 12.7 g/dL (ref 12.0–15.0)
MCH: 29 pg (ref 26.0–34.0)
MCHC: 33.3 g/dL (ref 30.0–36.0)
MCV: 87 fL (ref 78.0–100.0)
Platelets: 278 10*3/uL (ref 150–400)
RBC: 4.38 MIL/uL (ref 3.87–5.11)
RDW: 15.2 % (ref 11.5–15.5)
WBC: 12.5 10*3/uL — ABNORMAL HIGH (ref 4.0–10.5)

## 2016-07-17 LAB — URINE MICROSCOPIC-ADD ON

## 2016-07-17 LAB — POC OCCULT BLOOD, ED: FECAL OCCULT BLD: NEGATIVE

## 2016-07-17 LAB — LIPASE, BLOOD: Lipase: 49 U/L (ref 11–51)

## 2016-07-17 MED ORDER — ONDANSETRON HCL 4 MG/2ML IJ SOLN
4.0000 mg | Freq: Once | INTRAMUSCULAR | Status: AC
  Administered 2016-07-17: 4 mg via INTRAVENOUS
  Filled 2016-07-17: qty 2

## 2016-07-17 MED ORDER — ONDANSETRON 4 MG PO TBDP
4.0000 mg | ORAL_TABLET | Freq: Once | ORAL | Status: AC
  Administered 2016-07-17: 4 mg via ORAL

## 2016-07-17 MED ORDER — SODIUM CHLORIDE 0.9 % IV BOLUS (SEPSIS)
1000.0000 mL | Freq: Once | INTRAVENOUS | Status: AC
  Administered 2016-07-17: 1000 mL via INTRAVENOUS

## 2016-07-17 MED ORDER — FENTANYL CITRATE (PF) 100 MCG/2ML IJ SOLN
50.0000 ug | INTRAMUSCULAR | Status: DC | PRN
  Administered 2016-07-17 (×2): 50 ug via INTRAVENOUS
  Filled 2016-07-17 (×2): qty 2

## 2016-07-17 MED ORDER — IOPAMIDOL (ISOVUE-300) INJECTION 61%
INTRAVENOUS | Status: AC
  Filled 2016-07-17: qty 30

## 2016-07-17 MED ORDER — ONDANSETRON 4 MG PO TBDP
ORAL_TABLET | ORAL | Status: DC
Start: 2016-07-17 — End: 2016-07-18
  Filled 2016-07-17: qty 1

## 2016-07-17 MED ORDER — FAMOTIDINE IN NACL 20-0.9 MG/50ML-% IV SOLN
20.0000 mg | Freq: Once | INTRAVENOUS | Status: AC
  Administered 2016-07-17: 20 mg via INTRAVENOUS
  Filled 2016-07-17: qty 50

## 2016-07-17 NOTE — ED Notes (Signed)
Patient transported to CT 

## 2016-07-17 NOTE — Discharge Instructions (Signed)
If you were given medicines take as directed.  If you are on coumadin or contraceptives realize their levels and effectiveness is altered by many different medicines.  If you have any reaction (rash, tongues swelling, other) to the medicines stop taking and see a physician.    If your blood pressure was elevated in the ER make sure you follow up for management with a primary doctor or return for chest pain, shortness of breath or stroke symptoms.  Please follow up as directed and return to the ER or see a physician for new or worsening symptoms.  Thank you. Vitals:   07/17/16 1616 07/17/16 1831 07/17/16 1957  BP: 128/64 147/69 152/69  Pulse: 94 92 86  Resp: 20 16 16   Temp: 98.1 F (36.7 C)  97.4 F (36.3 C)  TempSrc: Oral  Oral  SpO2: 100% 100% 100%  Weight: 162 lb (73.5 kg)    Height: 5\' 3"  (1.6 m)     \

## 2016-07-17 NOTE — ED Provider Notes (Signed)
[place] DEPT Provider Note   CSN: [id number] Arrival date & time: 07/17/16  1521     History   Chief Complaint Chief Complaint  Patient presents with  . Diarrhea    HPI Heather [name] is a 65 y.o. female.  Patient with history of bronchitis, diabetes, stomach ulcer, esophagitis presents with worsening epigastric discomfort and recurrent diarrhea with intermittent darker stools the past few days. This feels similar to her ulcer however no active bleeding. Patient has no fevers or chills. Decreased appetite. Vomiting nonbloody. Patient's had recurrent diarrhea multiple episodes every hour. No recent antibiotics or travel.      Past Medical History:  Diagnosis Date  . B12 deficiency   . Bronchitis, not specified as acute or chronic   . Cellulitis and abscess    abdomen and buttocks  . Depression   . Diabetes mellitus   . Diabetic neuropathy ([place])   . Disorders of phosphorus metabolism   . DJD (degenerative joint disease)    right forminal stenosis C4-5  . Edema   . Essential hypertension   . Folliculitis   . Gout   . Headache(784.0)   . Hiatal hernia   . Hypopotassemia   . Insomnia   . Irritable bowel syndrome   . Osteoarthrosis, unspecified whether generalized or localized, unspecified site   . Palpitations   . Pure hypercholesterolemia   . Reflux esophagitis   . Renal failure, unspecified   . Unspecified tinnitus    right    Patient Active Problem List   Diagnosis Date Noted  . Acute renal failure (ARF) ([place]) 01/25/2016  . Hematemesis 01/21/2016  . Melena 01/21/2016  . Upper GI bleed 01/21/2016  . Acute renal failure superimposed on stage 3 chronic kidney disease ([place]) 01/21/2016  . Coffee ground emesis 01/20/2016  . Faintness 04/29/2015  . Awareness alteration, transient 03/02/2015  . Neuropathy ([place]) 03/02/2015  . Pain in the chest   . Chest pain 12/22/2014  . Essential hypertension 12/22/2014  . Nausea & vomiting 12/22/2014  .  Diabetes mellitus ([place]) 12/22/2014  . Hiatal hernia 12/22/2014  . GERD (gastroesophageal reflux disease) 12/22/2014  . Gout 12/22/2014  . HLD (hyperlipidemia) 12/22/2014  . Depression 12/22/2014  . Chronic kidney disease, stage III (moderate) 08/25/2014  . Vitamin B12 deficiency 07/08/2011  . Iron deficiency anemia 05/10/2011  . Personal history of failed moderate sedation 04/16/2011  . Chronic Nausea and Vomiting - ? gastroapresis 04/16/2011  . Early satiety 04/16/2011  . ? Crohn's disease 04/16/2011    Class: Question of  . Irritable bowel syndrome 04/16/2011  . Benign paroxysmal positional vertigo 11/20/2010  . TRICUSPID REGURGITATION, MODERATE 11/20/2010  . PULMONARY HYPERTENSION, MILD 11/20/2010  . LEG EDEMA, BILATERAL 11/20/2010  . DYSPNEA 11/20/2010  . NONSPECIFIC ABNORMAL ELECTROCARDIOGRAM 11/20/2010  . CHEST WALL PAIN, HX OF 11/20/2010    Past Surgical History:  Procedure Laterality Date  . c-spine surgery     03/2007  . CARDIAC CATHETERIZATION    . CHOLECYSTECTOMY    . COLONOSCOPY  04/21/2011; 05/29/11   6/12: [facility] - ?AVM at IC valve, inflammatory changes at [facility] valve [facility]); 7/12 - gessner; IC valve erosions, look chronic and probably Crohn's per path  . ESOPHAGOGASTRODUODENOSCOPY  05/29/11   normal  . ESOPHAGOGASTRODUODENOSCOPY N/A 01/21/2016   Procedure: ESOPHAGOGASTRODUODENOSCOPY (EGD);  Surgeon: Wilford Corner, MD;  Location: [facility] ENDOSCOPY;  Service: Endoscopy;  Laterality: N/A;  . JOINT REPLACEMENT    . Right knee arthroscopic surgery    . TOTAL  ABDOMINAL HYSTERECTOMY      OB History    No data available       Home Medications    Prior to Admission medications   Medication Sig Start Date End Date Taking? Authorizing Provider  allopurinol (ZYLOPRIM) 100 MG tablet Take 100 mg by mouth daily.   Yes Historical Provider, MD  citalopram (CELEXA) 40 MG tablet Take 40 mg by mouth daily.    Yes Historical Provider, MD  cyanocobalamin (,VITAMIN  B-12,) 1000 MCG/ML injection Inject 1,000 mcg into the muscle every 30 (thirty) days.     Yes Historical Provider, MD  diazepam (VALIUM) 5 MG tablet Take 5 mg by mouth 2 (two) times daily as needed for anxiety.   Yes Historical Provider, MD  esomeprazole (NEXIUM) 40 MG capsule Take 40 mg by mouth daily before breakfast.     Yes Historical Provider, MD  furosemide (LASIX) 40 MG tablet Take 1 tablet (40 mg total) by mouth daily. 01/26/16  Yes Reyne Dumas, MD  insulin detemir (LEVEMIR) 100 UNIT/ML injection Inject 0.6 mLs (60 Units total) into the skin at bedtime. Patient taking differently: Inject 90 Units into the skin at bedtime.  01/25/16  Yes Reyne Dumas, MD  insulin lispro (HUMALOG KWIKPEN) 100 UNIT/ML SOPN Inject 10-50 Units into the skin 3 (three) times daily with meals. <70= no coverage 70-150= 10 units 151-200=15units 201-250=20units 251-300=25units 301-350=30units 351-400=35units 401-450=40units >450= 50 units   Yes Historical Provider, MD  Liraglutide (VICTOZA) 18 MG/3ML SOPN Inject 0.6 mg into the skin daily.   Yes Historical Provider, MD  metoprolol (LOPRESSOR) 100 MG tablet Take 100 mg by mouth 2 (two) times daily. Patient states she is taking the losartan as well   Yes Historical Provider, MD  NIFEdipine (PROCARDIA-XL/ADALAT-CC/NIFEDICAL-XL) 30 MG 24 hr tablet Take 30 mg by mouth daily.   Yes Historical Provider, MD  potassium chloride SA (K-DUR,KLOR-CON) 20 MEQ tablet Take 20 mEq by mouth daily.   Yes Historical Provider, MD  tiZANidine (ZANAFLEX) 4 MG tablet Take 8 mg by mouth at bedtime.    Yes Historical Provider, MD    Family History Family History  Problem Relation Age of Onset  . Diabetes Mother   . Colon cancer Neg Hx     Social History Social History  Substance Use Topics  . Smoking status: Never Smoker  . Smokeless tobacco: Never Used  . Alcohol use No     Allergies   Duloxetine hcl; Fluocinolone; Amlodipine besylate; Ciprofloxacin;  Hydrocodone-acetaminophen; Metformin; Quinolones; Sulfamethoxazole; Sulfonamide derivatives; Valsartan; and Lisinopril   Review of Systems Review of Systems  Constitutional: Negative for chills and fever.  HENT: Negative for congestion.   Eyes: Negative for visual disturbance.  Respiratory: Negative for shortness of breath.   Cardiovascular: Negative for chest pain.  Gastrointestinal: Positive for abdominal pain, diarrhea, nausea and vomiting.  Genitourinary: Negative for dysuria and flank pain.  Musculoskeletal: Negative for back pain, neck pain and neck stiffness.  Skin: Negative for rash.  Neurological: Negative for light-headedness and headaches.     Physical Exam Updated Vital Signs BP 152/69 (BP Location: Right Arm)   Pulse 86   Temp 97.4 F (36.3 C) (Oral)   Resp 16   Ht 5\' 3"  (1.6 m)   Wt 162 lb (73.5 kg)   SpO2 100%   BMI 28.70 kg/m   Physical Exam  Constitutional: She is oriented to person, place, and time. She appears well-developed and well-nourished.  HENT:  Head: Normocephalic and atraumatic.  Dry mm  Eyes: Conjunctivae are normal. Right eye exhibits no discharge. Left eye exhibits no discharge.  Neck: Normal range of motion. Neck supple. No tracheal deviation present.  Cardiovascular: Normal rate and regular rhythm.   Pulmonary/Chest: Effort normal and breath sounds normal.  Abdominal: Soft. She exhibits no distension. There is tenderness (mild upper). There is no guarding.  Musculoskeletal: She exhibits no edema.  Neurological: She is alert and oriented to person, place, and time.  Skin: Skin is warm. No rash noted.  Psychiatric: She has a normal mood and affect.  Nursing note and vitals reviewed.    ED Treatments / Results  Labs (all labs ordered are listed, but only abnormal results are displayed) Labs Reviewed  COMPREHENSIVE METABOLIC PANEL - Abnormal; Notable for the following:       Result Value   CO2 21 (*)    Glucose, Bld 107 (*)    BUN  31 (*)    Creatinine, Ser 1.75 (*)    Total Protein 8.6 (*)    Alkaline Phosphatase 129 (*)    GFR calc non Af Amer 30 (*)    GFR calc Af Amer 34 (*)    All other components within normal limits  CBC - Abnormal; Notable for the following:    WBC 12.5 (*)    All other components within normal limits  URINALYSIS, ROUTINE W REFLEX MICROSCOPIC (NOT AT [facility]) - Abnormal; Notable for the following:    Color, Urine STRAW (*)    Leukocytes, UA TRACE (*)    All other components within normal limits  URINE MICROSCOPIC-ADD ON - Abnormal; Notable for the following:    Squamous Epithelial / LPF 0-5 (*)    Bacteria, UA FEW (*)    All other components within normal limits  LIPASE, BLOOD  POC OCCULT BLOOD, ED    EKG  EKG Interpretation None       Radiology No results found.  Procedures Procedures (including critical care time)  Medications Ordered in ED Medications  ondansetron (ZOFRAN-ODT) disintegrating tablet 4 mg (4 mg Oral Given 07/17/16 1624)  famotidine (PEPCID) IVPB 20 mg premix (0 mg Intravenous Stopped 07/17/16 2131)  sodium chloride 0.9 % bolus 1,000 mL (0 mLs Intravenous Stopped 07/17/16 2152)  ondansetron (ZOFRAN) injection 4 mg (4 mg Intravenous Given 07/17/16 2142)     Initial Impression / Assessment and Plan / ED Course  I have reviewed the triage vital signs and the nursing notes.  Pertinent labs & imaging results that were available during my care of the patient were reviewed by me and considered in my medical decision making (see chart for details).  Clinical Course   Patient presents with recurrent diarrhea and epigastric discomfort. Concern for ulcer disease versus viral/toxin mediated versus colitis. Plan for IV fluids, CT scan without IV contrast due to kidney function discussed with CT technician.  CT no acute findings, blood work unremarkable.  Stable for GI and pcp fup.  Results and differential diagnosis were discussed with the  patient/parent/guardian. Xrays were independently reviewed by myself.  Close follow up outpatient was discussed, comfortable with the plan.   Medications  ondansetron (ZOFRAN-ODT) disintegrating tablet 4 mg (4 mg Oral Given 07/17/16 1624)  famotidine (PEPCID) IVPB 20 mg premix (0 mg Intravenous Stopped 07/17/16 2131)  sodium chloride 0.9 % bolus 1,000 mL (0 mLs Intravenous Stopped 07/17/16 2152)  ondansetron (ZOFRAN) injection 4 mg (4 mg Intravenous Given 07/17/16 2142)    Vitals:   07/17/16 1616 07/17/16 1831 07/17/16 1957  BP:  128/64 147/69 152/69  Pulse: 94 92 86  Resp: 20 16 16   Temp: 98.1 F (36.7 C)  97.4 F (36.3 C)  TempSrc: Oral  Oral  SpO2: 100% 100% 100%  Weight: 162 lb (73.5 kg)    Height: 5\' 3"  (1.6 m)      Final diagnoses:  Nausea vomiting and diarrhea     Final Clinical Impressions(s) / ED Diagnoses   Final diagnoses:  Nausea vomiting and diarrhea    New Prescriptions Discharge Medication List as of 07/17/2016  9:57 PM       Elnora Morrison, MD 07/20/16 1435

## 2016-07-17 NOTE — ED Triage Notes (Signed)
x3 days diarrhea Dark watery  Can't eat or drink anything PCP sent her here.  Hx of bleeding ulcers in march Fever this morning of 102 degrees.

## 2016-07-18 ENCOUNTER — Telehealth: Payer: Self-pay | Admitting: Gastroenterology

## 2016-07-18 NOTE — Telephone Encounter (Signed)
She was previously seen by Dr. Carlean Purl. Needs to follow-up with them.

## 2016-07-18 NOTE — Telephone Encounter (Signed)
Pt called to say that she was in the ER last night and was referred to [place] for follow up. She has a history of bleeding ulcers. Please advise if we can take her on as a new patient.

## 2016-07-19 NOTE — Telephone Encounter (Signed)
Pt is aware that she needs to follow up with Dr Carlean Purl

## 2016-07-23 ENCOUNTER — Emergency Department (HOSPITAL_COMMUNITY)
Admission: EM | Admit: 2016-07-23 | Discharge: 2016-07-23 | Disposition: A | Discharge status: Home / Self Care | Payer: Medicare Other | Attending: Emergency Medicine | Admitting: Emergency Medicine

## 2016-07-23 ENCOUNTER — Encounter (HOSPITAL_COMMUNITY): Payer: Self-pay

## 2016-07-23 DIAGNOSIS — Z5321 Procedure and treatment not carried out due to patient leaving prior to being seen by health care provider: Secondary | ICD-10-CM | POA: Insufficient documentation

## 2016-07-23 DIAGNOSIS — R197 Diarrhea, unspecified: Secondary | ICD-10-CM | POA: Diagnosis present

## 2016-07-23 LAB — COMPREHENSIVE METABOLIC PANEL
ALBUMIN: 4.2 g/dL (ref 3.5–5.0)
ALK PHOS: 117 U/L (ref 38–126)
ALT: 31 U/L (ref 14–54)
AST: 27 U/L (ref 15–41)
Anion gap: 12 (ref 5–15)
BILIRUBIN TOTAL: 0.4 mg/dL (ref 0.3–1.2)
BUN: 31 mg/dL — AB (ref 6–20)
CALCIUM: 10.3 mg/dL (ref 8.9–10.3)
CO2: 23 mmol/L (ref 22–32)
Chloride: 101 mmol/L (ref 101–111)
Creatinine, Ser: 2.19 mg/dL — ABNORMAL HIGH (ref 0.44–1.00)
GFR calc Af Amer: 26 mL/min — ABNORMAL LOW (ref >60)
GFR calc non Af Amer: 23 mL/min — ABNORMAL LOW (ref >60)
GLUCOSE: 142 mg/dL — AB (ref 65–99)
Potassium: 4.2 mmol/L (ref 3.5–5.1)
SODIUM: 136 mmol/L (ref 135–145)
TOTAL PROTEIN: 7.1 g/dL (ref 6.5–8.1)

## 2016-07-23 LAB — CBC
HEMATOCRIT: 37 % (ref 36.0–46.0)
Hemoglobin: 12.1 g/dL (ref 12.0–15.0)
MCH: 28.7 pg (ref 26.0–34.0)
MCHC: 32.7 g/dL (ref 30.0–36.0)
MCV: 87.7 fL (ref 78.0–100.0)
Platelets: 262 10*3/uL (ref 150–400)
RBC: 4.22 MIL/uL (ref 3.87–5.11)
RDW: 15.5 % (ref 11.5–15.5)
WBC: 9.9 10*3/uL (ref 4.0–10.5)

## 2016-07-23 LAB — LIPASE, BLOOD: Lipase: 51 U/L (ref 11–51)

## 2016-07-23 MED ORDER — ONDANSETRON 4 MG PO TBDP
4.0000 mg | ORAL_TABLET | Freq: Once | ORAL | Status: AC | PRN
  Administered 2016-07-23: 4 mg via ORAL

## 2016-07-23 MED ORDER — ONDANSETRON 4 MG PO TBDP
ORAL_TABLET | ORAL | Status: AC
  Filled 2016-07-23: qty 1

## 2016-07-23 NOTE — ED Triage Notes (Signed)
Pt states that since last wed she has been having diarrhea along with n/v and fevers as high as 102. Pt was seen at [facility] last week for the same. Pt states that her stools have been runny and dark, c/o dizziness and weakness. Pt has a hx of ulcers.

## 2016-07-23 NOTE — ED Notes (Signed)
Pt states that she is unable to stay any longer, states her diarrhea is worse and she does not want to wait.

## 2016-07-26 ENCOUNTER — Encounter: Payer: Self-pay | Admitting: Nurse Practitioner

## 2016-07-27 ENCOUNTER — Encounter: Payer: Self-pay | Admitting: Nurse Practitioner

## 2016-08-06 ENCOUNTER — Encounter: Payer: Self-pay | Admitting: Nurse Practitioner

## 2016-08-06 ENCOUNTER — Other Ambulatory Visit (INDEPENDENT_AMBULATORY_CARE_PROVIDER_SITE_OTHER): Payer: Medicare Other

## 2016-08-06 ENCOUNTER — Ambulatory Visit (INDEPENDENT_AMBULATORY_CARE_PROVIDER_SITE_OTHER): Payer: Medicare Other | Admitting: Nurse Practitioner

## 2016-08-06 VITALS — BP 122/62 | HR 84 | Ht 63.0 in | Wt 164.4 lb

## 2016-08-06 DIAGNOSIS — R112 Nausea with vomiting, unspecified: Secondary | ICD-10-CM

## 2016-08-06 DIAGNOSIS — R1084 Generalized abdominal pain: Secondary | ICD-10-CM

## 2016-08-06 DIAGNOSIS — R197 Diarrhea, unspecified: Secondary | ICD-10-CM

## 2016-08-06 LAB — COMPREHENSIVE METABOLIC PANEL
ALK PHOS: 108 U/L (ref 39–117)
ALT: 32 U/L (ref 0–35)
AST: 26 U/L (ref 0–37)
Albumin: 3.9 g/dL (ref 3.5–5.2)
BILIRUBIN TOTAL: 0.4 mg/dL (ref 0.2–1.2)
BUN: 31 mg/dL — AB (ref 6–23)
CO2: 26 meq/L (ref 19–32)
CREATININE: 1.82 mg/dL — AB (ref 0.40–1.20)
Calcium: 10.1 mg/dL (ref 8.4–10.5)
Chloride: 102 mEq/L (ref 96–112)
GFR: 29.72 mL/min — ABNORMAL LOW (ref >60.00)
GLUCOSE: 84 mg/dL (ref 70–99)
Potassium: 3.9 mEq/L (ref 3.5–5.1)
SODIUM: 138 meq/L (ref 135–145)
TOTAL PROTEIN: 7.3 g/dL (ref 6.0–8.3)

## 2016-08-06 LAB — CBC WITH DIFFERENTIAL/PLATELET
BASOS ABS: 0.1 10*3/uL (ref 0.0–0.1)
Basophils Relative: 0.6 % (ref 0.0–3.0)
EOS ABS: 0.4 10*3/uL (ref 0.0–0.7)
Eosinophils Relative: 4.5 % (ref 0.0–5.0)
HCT: 35.2 % — ABNORMAL LOW (ref 36.0–46.0)
Hemoglobin: 12 g/dL (ref 12.0–15.0)
LYMPHS ABS: 2.1 10*3/uL (ref 0.7–4.0)
Lymphocytes Relative: 22 % (ref 12.0–46.0)
MCHC: 34.1 g/dL (ref 30.0–36.0)
MCV: 85.7 fl (ref 78.0–100.0)
MONO ABS: 0.6 10*3/uL (ref 0.1–1.0)
MONOS PCT: 6.3 % (ref 3.0–12.0)
NEUTROS ABS: 6.3 10*3/uL (ref 1.4–7.7)
NEUTROS PCT: 66.6 % (ref 43.0–77.0)
PLATELETS: 270 10*3/uL (ref 150.0–400.0)
RBC: 4.1 Mil/uL (ref 3.87–5.11)
RDW: 16.7 % — ABNORMAL HIGH (ref 11.5–15.5)
WBC: 9.4 10*3/uL (ref 4.0–10.5)

## 2016-08-06 LAB — LIPASE: Lipase: 59 U/L (ref 11.0–59.0)

## 2016-08-06 LAB — AMYLASE: Amylase: 36 U/L (ref 27–131)

## 2016-08-06 MED ORDER — ONDANSETRON HCL 4 MG PO TABS: ORAL_TABLET | ORAL | 1 refills | Status: DC

## 2016-08-06 NOTE — Patient Instructions (Addendum)
Please go to the basement level to have your labs drawn and a stool study. Continue the Phenergan suppositories.  We sent the Zofran prescription to [place].  Follow up with Dr. Carlean Purl in 2 months.

## 2016-08-07 LAB — CLOSTRIDIUM DIFFICILE BY PCR: CDIFFPCR: NOT DETECTED

## 2016-08-07 NOTE — Progress Notes (Signed)
HPI: Patient is a 65 year old female referred by her PCP, Dr. [name] for nausea, vomiting and diarrhea. Patient known to Dr. Carlean Purl from 2012 when she was evaluated for possible Crohn's disease  based on random colon biopsies as well as ICV erosions on a colonoscopy done in [facility] at [facility].  It wasn't clear if ulcerations were NSAID induced. She had been frequently hospitalized in [place] with nausea and vomiting. Gastric emptying study was abnormal but she had possibly been on medications to slow motility when test done. She was tried on Entocort without improvement. While hospitalized one time in [place] she received Solu-Medrol without improvement.  We admitted the patient to the hospital September 2012 with dehydration and electrolyte abnormalities secondary to vomiting and diarrhea. Capsule endoscopy was done with findings of small bowel erosions and one aphthous ulcer per discharge summary. Patient hasn't been seen here since.   In March of this year patient was transferred from [facility] to [place] for GI bleed and lack of GI coverage at [facility]. Inpatient upper endoscopy by [facility] revealing a prepyloric oozing ulcer. H. pylori was negative . Patient states she was given blood. Baseline hemoglobin around 10, that admission it fell to 8.1. At time of discharge hemoglobin up to 9.  Patient now presents with a 2 week history of vomiting and watery diarrhea, numerous episodes of diarrhea during the day. No recent antibiotics. She does not take NSAIDs. Patient was evaluated at the [facility] ED 07/17/16. Renal function at baseline,  white count 12.5, hemoglobin 12.7, and contrast CT without acute findings. Fecal occult blood test was negative. She was treated with fluids, IV Pepcid and antiemetics then discharged home from the ED.  Patient went to [facility] ED 07/23/16 for ongoing diarrhea, nausea and vomiting and fevers at home as high as 102. She reported dark  stools. She was not seen,  left because of a long wait time.  Labs were drawn however and her creatinine was worse at 2.19 otherwise ok including a normal WBC. Glucose was 142. Patient has Phenergan tablets and suppositories at home but not helping. Patient miserable, she wants to be admitted to the hospital to find out what is going on.   Past Medical History:  Diagnosis Date  . B12 deficiency   . Bronchitis, not specified as acute or chronic   . Cellulitis and abscess    abdomen and buttocks  . Depression   . Diabetes mellitus   . Diabetic neuropathy ([place])   . Disorders of phosphorus metabolism   . DJD (degenerative joint disease)    right forminal stenosis C4-5  . Edema   . Essential hypertension   . Folliculitis   . Gout   . Headache(784.0)   . Hiatal hernia   . Hypopotassemia   . Insomnia   . Irritable bowel syndrome   . Osteoarthrosis, unspecified whether generalized or localized, unspecified site   . Palpitations   . Pure hypercholesterolemia   . Reflux esophagitis   . Renal failure, unspecified   . Unspecified tinnitus    right     Past Surgical History:  Procedure Laterality Date  . c-spine surgery     03/2007  . CARDIAC CATHETERIZATION    . CHOLECYSTECTOMY    . COLONOSCOPY  04/21/2011; 05/29/11   6/12: [facility] - ?AVM at IC valve, inflammatory changes at [facility] valve [facility]); 7/12 - gessner; IC valve erosions, look chronic and probably Crohn's per path  .  ESOPHAGOGASTRODUODENOSCOPY  05/29/11   normal  . ESOPHAGOGASTRODUODENOSCOPY N/A 01/21/2016   Procedure: ESOPHAGOGASTRODUODENOSCOPY (EGD);  Surgeon: Wilford Corner, MD;  Location: [facility] ENDOSCOPY;  Service: Endoscopy;  Laterality: N/A;  . JOINT REPLACEMENT    . Right knee arthroscopic surgery    . TOTAL ABDOMINAL HYSTERECTOMY     Family History  Problem Relation Age of Onset  . Diabetes Mother   . Colon cancer Neg Hx    Social History  Substance Use Topics  . Smoking status: Never Smoker  .  Smokeless tobacco: Never Used  . Alcohol use No   Current Outpatient Prescriptions  Medication Sig Dispense Refill  . allopurinol (ZYLOPRIM) 100 MG tablet Take 100 mg by mouth daily.    . citalopram (CELEXA) 40 MG tablet Take 40 mg by mouth daily.     . cyanocobalamin (,VITAMIN B-12,) 1000 MCG/ML injection Inject 1,000 mcg into the muscle every 30 (thirty) days.      . diazepam (VALIUM) 5 MG tablet Take 5 mg by mouth 2 (two) times daily as needed for anxiety.    Marland Kitchen esomeprazole (NEXIUM) 40 MG capsule Take 40 mg by mouth daily before breakfast.      . furosemide (LASIX) 40 MG tablet Take 1 tablet (40 mg total) by mouth daily. 30 tablet 0  . insulin detemir (LEVEMIR) 100 UNIT/ML injection Inject 0.6 mLs (60 Units total) into the skin at bedtime. (Patient taking differently: Inject 90 Units into the skin at bedtime. ) 10 mL 11  . insulin lispro (HUMALOG KWIKPEN) 100 UNIT/ML SOPN Inject 10-50 Units into the skin 3 (three) times daily with meals. <70= no coverage 70-150= 10 units 151-200=15units 201-250=20units 251-300=25units 301-350=30units 351-400=35units 401-450=40units >450= 50 units    . Liraglutide (VICTOZA) 18 MG/3ML SOPN Inject 0.6 mg into the skin daily.    . metoprolol (LOPRESSOR) 100 MG tablet Take 100 mg by mouth 2 (two) times daily. Patient states she is taking the losartan as well    . NIFEdipine (PROCARDIA-XL/ADALAT-CC/NIFEDICAL-XL) 30 MG 24 hr tablet Take 30 mg by mouth daily.    . potassium chloride SA (K-DUR,KLOR-CON) 20 MEQ tablet Take 20 mEq by mouth daily.    Marland Kitchen tiZANidine (ZANAFLEX) 4 MG tablet Take 8 mg by mouth at bedtime.     . ondansetron (ZOFRAN) 4 MG tablet Take 1 tab every 6 hours as needed for nausea. 30 tablet 1   No current facility-administered medications for this visit.    Allergies  Allergen Reactions  . Duloxetine Hcl Swelling  . Fluocinolone Swelling  . Amlodipine Besylate     Swelling of feet, fluid retention  . Ciprofloxacin Nausea And Vomiting  and Swelling    Swelling of face, jaw, and lips  . Hydrocodone-Acetaminophen Itching  . Metformin Other (See Comments)    REACTION: GI UPSET  . Quinolones Swelling  . Sulfamethoxazole Swelling    Swelling of feet, legs  . Sulfonamide Derivatives     Swelling   . Valsartan Swelling  . Lisinopril Swelling and Rash    Oral swelling, and red streaks on arms/stomach     Review of Systems: Positive for:  anxiety, arthritis, back pain, blood in stool, vision changes, confusion, depression, fatigue, fevers, headaches, muscle pain and cramps, night sweats, shortness of breath, sleeping problems, swelling of feet and legs, excessive thirst, excessive urination, urinary frequency and urinary leakage. All remaining systems reviewed and negative except where noted in HPI.    Ct Abdomen Pelvis Wo Contrast  Result Date: 07/17/2016 CLINICAL DATA:  Acute abdominal pain with diarrhea, elevated creatinine. EXAM: CT ABDOMEN AND PELVIS WITHOUT CONTRAST TECHNIQUE: Multidetector CT imaging of the abdomen and pelvis was performed following the standard protocol without IV contrast. COMPARISON:  09/21/2014 FINDINGS: Lower chest: Minor dependent basilar atelectasis. Stable noncalcified well-circumscribed left lower lobe 5 mm nodule, dating back to 09/21/2014 compatible with a benign nodule. Lung bases otherwise clear. Normal heart size. No pericardial or pleural effusion. Hepatobiliary: No focal liver abnormality is seen. Status post cholecystectomy. No biliary dilatation. Pancreas: Unremarkable. No pancreatic ductal dilatation or surrounding inflammatory changes. Spleen: Normal in size without focal abnormality. Adrenals/Urinary Tract: Normal adrenal glands. Kidneys demonstrate no acute obstruction, perinephric inflammatory process, obstructive uropathy, hydronephrosis, or obstructing ureteral calculus. Ureters are symmetric and decompressed. Small incidental left midpole cortical cyst measures 7 mm. No other renal  abnormality. Stomach/Bowel: Stomach is underdistended accounting for wall prominence. Negative for bowel obstruction, significant dilatation, ileus, or free air. Appendix not visualized. Minor scattered colonic diverticulosis without acute inflammatory process. Vascular/Lymphatic: Aortic atherosclerosis without aneurysm. No adenopathy. No retroperitoneal abnormality. Reproductive: Remote hysterectomy. No adnexal abnormality or mass. No pelvic free fluid or fluid collection. Other: Remote left inguinal hernia repair. No recurrent hernia. Intact abdominal wall. No abdominal or pelvic ascites or fluid collection. Negative for abscess. Musculoskeletal: Diffuse degenerative changes of the spine. No acute osseous finding. IMPRESSION: No acute intra-abdominal or pelvic process by noncontrast imaging. Patient was unable to receive IV contrast because of an elevated creatinine. Stable benign 5 mm left lower lobe pulmonary nodule. Remote cholecystectomy and hysterectomy Aortic atherosclerosis without aneurysm Scattered minor colonic diverticulosis without acute inflammatory process Electronically Signed   By: Jerilynn Mages.  Shick M.D.   On: 07/17/2016 21:07    Physical Exam: BP 122/62   Pulse 84   Ht 5\' 3"  (1.6 m)   Wt 164 lb 6 oz (74.6 kg)   BMI 29.12 kg/m  Constitutional: well-developed, emotional white female  HEENT: Normocephalic and atraumatic. Conjunctivae are normal. No scleral icterus. Neck supple.  Cardiovascular: Normal rate, regular rhythm.  Pulmonary/chest: Effort normal and breath sounds normal. No wheezing, rales or rhonchi. Abdominal: Soft, nondistended, diffusely tender. Bowel sounds active throughout. There are no masses palpable. No hepatomegaly. Extremities: no edema Lymphadenopathy: No cervical adenopathy noted. Neurological: Alert and oriented to person place and time. Skin: Skin is warm and dry. No rashes noted. Psychiatric: Emotional but cooperative .    ASSESSMENT AND PLAN:  82.  65 year old female with 2 week history of nausea, vomiting, diarrhea, abdominal pain, recent ED visit for symptoms was unrevealing including a noncontrast CTscan. Patient had several similar severe episodes in 2012 at which time there was question of Crohn's vrs NSAID induced bowel injury based on colonoscopy findings. She had been doing okay since 2012 until two weeks ago.   Patient is hemodynamically stable, nontoxic appearing, I understand she feels terrible but I cannot justify direct admission. I offered ED vrs stat labs with reevaluation in a couple of hours.  - stat labs - stool for c-diff - Zofran ODT while patient awaiting for lab results. She will check back with [place] in an hour or so. Obviously if she has severe electrolyte abnormalities, acute kidney injury, etc.. Then will make arrangements for admission or at  ED evaluation.     2. Diabetes, ? Gastroparesis. Abnormal emptying study in 2012 but may have been affected by meds.  3 Depression /anxiety     ADDENDUM:  Lab results reviewed. Cr improved from 2.19 ---> 1.82. Glucose 84. Potassium normal  3.9, albumin normal at 3.9. WBC normal, hemoglobin normal at 12.   Patient actually looks little better than when I saw her this morning. We reviewed all her labs and she seems  relieved about the results.   -Over next few days will try Zofran every 6 hours with prn Phenergan for breakthrough nausea. - Trial of Imodium for diarrhea pending stool study results.  -Call patient in a few days with stool study results and to get a condition update. In the interim if she is not improving then ED evaluation necessary -Follow-up appointment with Dr. Carlean Purl given.   [name], MD

## 2016-08-08 ENCOUNTER — Telehealth: Payer: Self-pay

## 2016-08-08 NOTE — Telephone Encounter (Signed)
Spoke with the patient about her lab results. She states her symptoms persist. She is dealing with daily vomiting, nausea, diarrhea and abdominal discomfort. Unable to retain the suppository and she states Zofran comes back up too. Discussed suppository insertion. She asks if she was supposed to have further testing.

## 2016-08-08 NOTE — Telephone Encounter (Signed)
-----   Message from Willia Craze, NP sent at 08/08/2016 10:18 AM EDT ----- Eustaquio Maize, please let patient know that her C. difficile is negative. I have already reviewed all of her labs with her. I hope she is feeling better. Please remind her about the appointment with Dr. Carlean Purl, she does need to reestablish care with him as he has not seen her since 2012. Thanks

## 2016-08-15 ENCOUNTER — Emergency Department (HOSPITAL_COMMUNITY)
Admission: EM | Admit: 2016-08-15 | Discharge: 2016-08-16 | Disposition: A | Discharge status: Home / Self Care | Payer: Medicare Other | Attending: Emergency Medicine | Admitting: Emergency Medicine

## 2016-08-15 ENCOUNTER — Encounter (HOSPITAL_COMMUNITY): Payer: Self-pay | Admitting: Emergency Medicine

## 2016-08-15 ENCOUNTER — Telehealth: Payer: Self-pay | Admitting: Physician Assistant

## 2016-08-15 DIAGNOSIS — Z794 Long term (current) use of insulin: Secondary | ICD-10-CM | POA: Diagnosis not present

## 2016-08-15 DIAGNOSIS — R197 Diarrhea, unspecified: Secondary | ICD-10-CM

## 2016-08-15 DIAGNOSIS — R112 Nausea with vomiting, unspecified: Secondary | ICD-10-CM | POA: Diagnosis present

## 2016-08-15 DIAGNOSIS — E1140 Type 2 diabetes mellitus with diabetic neuropathy, unspecified: Secondary | ICD-10-CM | POA: Insufficient documentation

## 2016-08-15 DIAGNOSIS — I10 Essential (primary) hypertension: Secondary | ICD-10-CM

## 2016-08-15 DIAGNOSIS — N183 Chronic kidney disease, stage 3 (moderate): Secondary | ICD-10-CM | POA: Insufficient documentation

## 2016-08-15 DIAGNOSIS — I129 Hypertensive chronic kidney disease with stage 1 through stage 4 chronic kidney disease, or unspecified chronic kidney disease: Secondary | ICD-10-CM | POA: Insufficient documentation

## 2016-08-15 DIAGNOSIS — K2970 Gastritis, unspecified, without bleeding: Secondary | ICD-10-CM | POA: Insufficient documentation

## 2016-08-15 LAB — CBC
HCT: 36.4 % (ref 36.0–46.0)
Hemoglobin: 12.3 g/dL (ref 12.0–15.0)
MCH: 29.1 pg (ref 26.0–34.0)
MCHC: 33.8 g/dL (ref 30.0–36.0)
MCV: 86.3 fL (ref 78.0–100.0)
PLATELETS: 225 10*3/uL (ref 150–400)
RBC: 4.22 MIL/uL (ref 3.87–5.11)
RDW: 15.6 % — AB (ref 11.5–15.5)
WBC: 9.2 10*3/uL (ref 4.0–10.5)

## 2016-08-15 LAB — COMPREHENSIVE METABOLIC PANEL
ALK PHOS: 103 U/L (ref 38–126)
ALT: 52 U/L (ref 14–54)
AST: 33 U/L (ref 15–41)
Albumin: 4.2 g/dL (ref 3.5–5.0)
Anion gap: 9 (ref 5–15)
BILIRUBIN TOTAL: 0.6 mg/dL (ref 0.3–1.2)
BUN: 25 mg/dL — AB (ref 6–20)
CALCIUM: 9.1 mg/dL (ref 8.9–10.3)
CHLORIDE: 102 mmol/L (ref 101–111)
CO2: 26 mmol/L (ref 22–32)
CREATININE: 1.55 mg/dL — AB (ref 0.44–1.00)
GFR, EST AFRICAN AMERICAN: 40 mL/min — AB (ref >60)
GFR, EST NON AFRICAN AMERICAN: 34 mL/min — AB (ref >60)
Glucose, Bld: 335 mg/dL — ABNORMAL HIGH (ref 65–99)
Potassium: 4.1 mmol/L (ref 3.5–5.1)
Sodium: 137 mmol/L (ref 135–145)
TOTAL PROTEIN: 7.6 g/dL (ref 6.5–8.1)

## 2016-08-15 LAB — LIPASE, BLOOD: LIPASE: 71 U/L — AB (ref 11–51)

## 2016-08-15 NOTE — Telephone Encounter (Signed)
I spoke with this patient on the phone. She states despite supportive measures and medications, she continues to vomit. She reports feeling weak and washed out. States she feels lightheaded like she will pass out when she stands up. Cannot keep her medications down. She will consider ER.

## 2016-08-15 NOTE — ED Triage Notes (Signed)
Pt states for the past 3 weeks she has had diarrhea that is brown water and vomiting states she is unable to hold down even water  Pt states she has upper abd pain and feels dizzy and "swimmy headed" and weak as water

## 2016-08-15 NOTE — ED Notes (Signed)
Pt stated "the GI doc gave me medicine for nausea & suppositories but I can't anything by mouth.  In March of this year, I was @ [facility].  I had bleeding ulcers.  The diarrhea is watery brown."  Pt now c/o c/p.  Pt placed on monitor.

## 2016-08-15 NOTE — ED Notes (Signed)
Pt stated "my doctor told me I was Stage IV kidney failure."

## 2016-08-15 NOTE — ED Provider Notes (Signed)
[facility] DEPT Provider Note   CSN: [id number] Arrival date & time: 08/15/16  1948  By signing my name below, I, Heather [name], attest that this documentation has been prepared under the direction and in the presence of physician practitioner, Orpah Greek, MD. Electronically Signed: Dora [name], Scribe. 08/15/2016. 11:48 PM.  History   Chief Complaint Chief Complaint  Patient presents with  . Abdominal Pain  . Emesis    The history is provided by the patient. No language interpreter was used.     HPI Comments: Heather [name] is a 65 y.o. female with PMHx significant for DM, IBS, upper GI bleed, renal failure, stage III CKD, GERD, peptic ulcer, and chronic nausea and vomiting who presents to the Emergency Department complaining of constant nausea and vomiting for the last 4 weeks. She endorses associated epigastric abdominal pain, diarrhea, and generalized weakness. Pt states she has not been able to keep down food or fluids. She notes she has even regurgitated her medications. She was seen here 3 weeks ago and had a follow up appointment at [facility] several days ago; she states she was advised to come to the ER to be admitted to the hospital. Pt notes she has experienced similar symptoms in the past, but never as prolonged. She has listed PSHx of abdominal hysterectomy and cholecystectomy. She denies fever, chills, numbness, or any other associated symptoms.  Past Medical History:  Diagnosis Date  . B12 deficiency   . Bronchitis, not specified as acute or chronic   . Cellulitis and abscess    abdomen and buttocks  . Depression   . Diabetes mellitus   . Diabetic neuropathy ([place])   . Disorders of phosphorus metabolism   . DJD (degenerative joint disease)    right forminal stenosis C4-5  . Edema   . Essential hypertension   . Folliculitis   . Gout   . Headache(784.0)   . Hiatal hernia   . Hypopotassemia   . Insomnia   . Irritable bowel syndrome   .  Osteoarthrosis, unspecified whether generalized or localized, unspecified site   . Palpitations   . Pure hypercholesterolemia   . Reflux esophagitis   . Renal failure, unspecified   . Unspecified tinnitus    right    Patient Active Problem List   Diagnosis Date Noted  . Acute renal failure (ARF) ([place]) 01/25/2016  . Hematemesis 01/21/2016  . Melena 01/21/2016  . Upper GI bleed 01/21/2016  . Acute renal failure superimposed on stage 3 chronic kidney disease ([place]) 01/21/2016  . Coffee ground emesis 01/20/2016  . Faintness 04/29/2015  . Awareness alteration, transient 03/02/2015  . Neuropathy ([place]) 03/02/2015  . Pain in the chest   . Chest pain 12/22/2014  . Essential hypertension 12/22/2014  . Nausea & vomiting 12/22/2014  . Diabetes mellitus ([place]) 12/22/2014  . Hiatal hernia 12/22/2014  . GERD (gastroesophageal reflux disease) 12/22/2014  . Gout 12/22/2014  . HLD (hyperlipidemia) 12/22/2014  . Depression 12/22/2014  . Chronic kidney disease, stage III (moderate) 08/25/2014  . Vitamin B12 deficiency 07/08/2011  . Iron deficiency anemia 05/10/2011  . Personal history of failed moderate sedation 04/16/2011  . Chronic Nausea and Vomiting - ? gastroapresis 04/16/2011  . Early satiety 04/16/2011  . ? Crohn's disease 04/16/2011    Class: Question of  . Irritable bowel syndrome 04/16/2011  . Benign paroxysmal positional vertigo 11/20/2010  . TRICUSPID REGURGITATION, MODERATE 11/20/2010  . PULMONARY HYPERTENSION, MILD 11/20/2010  . LEG EDEMA, BILATERAL 11/20/2010  .  DYSPNEA 11/20/2010  . NONSPECIFIC ABNORMAL ELECTROCARDIOGRAM 11/20/2010  . CHEST WALL PAIN, HX OF 11/20/2010    Past Surgical History:  Procedure Laterality Date  . c-spine surgery     03/2007  . CARDIAC CATHETERIZATION    . CHOLECYSTECTOMY    . COLONOSCOPY  04/21/2011; 05/29/11   6/12: [place] - ?AVM at IC valve, inflammatory changes at [facility] valve [facility]); 7/12 - [facility]; IC valve erosions, look  chronic and probably Crohn's per path  . ESOPHAGOGASTRODUODENOSCOPY  05/29/11   normal  . ESOPHAGOGASTRODUODENOSCOPY N/A 01/21/2016   Procedure: ESOPHAGOGASTRODUODENOSCOPY (EGD);  Surgeon: Wilford Corner, MD;  Location[facility] ENDOSCOPY;  Service: Endoscopy;  Laterality: N/A;  . JOINT REPLACEMENT    . Right knee arthroscopic surgery    . TOTAL ABDOMINAL HYSTERECTOMY      OB History    No data available       Home Medications    Prior to Admission medications   Medication Sig Start Date End Date Taking? Authorizing Provider  allopurinol (ZYLOPRIM) 100 MG tablet Take 100 mg by mouth daily.   Yes Historical Provider, MD  citalopram (CELEXA) 40 MG tablet Take 40 mg by mouth daily.    Yes Historical Provider, MD  cyanocobalamin (,VITAMIN B-12,) 1000 MCG/ML injection Inject 1,000 mcg into the muscle every 30 (thirty) days.     Yes Historical Provider, MD  diazepam (VALIUM) 5 MG tablet Take 5 mg by mouth 2 (two) times daily as needed for anxiety.   Yes Historical Provider, MD  esomeprazole (NEXIUM) 40 MG capsule Take 40 mg by mouth daily before breakfast.     Yes Historical Provider, MD  furosemide (LASIX) 40 MG tablet Take 1 tablet (40 mg total) by mouth daily. 01/26/16  Yes Reyne Dumas, MD  insulin detemir (LEVEMIR) 100 UNIT/ML injection Inject 0.6 mLs (60 Units total) into the skin at bedtime. Patient taking differently: Inject 90 Units into the skin at bedtime.  01/25/16  Yes Reyne Dumas, MD  insulin lispro (HUMALOG KWIKPEN) 100 UNIT/ML SOPN Inject 10-50 Units into the skin 3 (three) times daily with meals. <70= no coverage 70-150= 10 units 151-200=15units 201-250=20units 251-300=25units 301-350=30units 351-400=35units 401-450=40units >450= 50 units   Yes Historical Provider, MD  Liraglutide (VICTOZA) 18 MG/3ML SOPN Inject 0.6 mg into the skin daily.   Yes Historical Provider, MD  metoprolol (LOPRESSOR) 100 MG tablet Take 100 mg by mouth 2 (two) times daily. Patient states she is  taking the losartan as well   Yes Historical Provider, MD  NIFEdipine (PROCARDIA-XL/ADALAT-CC/NIFEDICAL-XL) 30 MG 24 hr tablet Take 30 mg by mouth daily.   Yes Historical Provider, MD  ondansetron (ZOFRAN) 4 MG tablet Take 1 tab every 6 hours as needed for nausea. 08/06/16  Yes Willia Craze, NP  potassium chloride SA (K-DUR,KLOR-CON) 20 MEQ tablet Take 20 mEq by mouth daily.   Yes Historical Provider, MD  tiZANidine (ZANAFLEX) 4 MG tablet Take 8 mg by mouth at bedtime.    Yes Historical Provider, MD  diphenoxylate-atropine (LOMOTIL) 2.5-0.025 MG tablet Take 2 tablets by mouth 4 (four) times daily as needed for diarrhea or loose stools. 08/16/16   Orpah Greek, MD  Hyoscyamine Sulfate SL (LEVSIN/SL) 0.125 MG SUBL Place 0.125 mg under the tongue every 4 (four) hours as needed (abd pain). 08/16/16   Orpah Greek, MD  ondansetron (ZOFRAN) 4 MG tablet Take 1 tablet (4 mg total) by mouth every 6 (six) hours. 08/16/16   Orpah Greek, MD  pantoprazole ([place]) 20  MG tablet Take 1 tablet (20 mg total) by mouth 2 (two) times daily. 08/16/16   Orpah Greek, MD  sucralfate (CARAFATE) 1 GM/10ML suspension Take 10 mLs (1 g total) by mouth 4 (four) times daily -  with meals and at bedtime. 08/16/16   Orpah Greek, MD    Family History Family History  Problem Relation Age of Onset  . Diabetes Mother   . Colon cancer Neg Hx     Social History Social History  Substance Use Topics  . Smoking status: Never Smoker  . Smokeless tobacco: Never Used  . Alcohol use No     Allergies   Duloxetine hcl; Fluocinolone; Amlodipine besylate; Ciprofloxacin; Hydrocodone-acetaminophen; Metformin; Quinolones; Sulfamethoxazole; Sulfonamide derivatives; Valsartan; and Lisinopril   Review of Systems Review of Systems  Constitutional: Negative for chills and fever.  Gastrointestinal: Positive for abdominal pain (epigastric), diarrhea, nausea and vomiting.  Neurological:  Positive for weakness (generalized). Negative for numbness.  All other systems reviewed and are negative.   Physical Exam Updated Vital Signs BP 163/71   Pulse 89   Temp 98.1 F (36.7 C) (Oral)   Resp 10   Ht 5\' 3"  (1.6 m)   Wt 166 lb (75.3 kg)   SpO2 96%   BMI 29.41 kg/m   Physical Exam  Constitutional: She is oriented to person, place, and time. She appears well-developed and well-nourished. No distress.  HENT:  Head: Normocephalic and atraumatic.  Right Ear: Hearing normal.  Left Ear: Hearing normal.  Nose: Nose normal.  Mouth/Throat: Oropharynx is clear and moist and mucous membranes are normal.  Eyes: Conjunctivae and EOM are normal. Pupils are equal, round, and reactive to light.  Neck: Normal range of motion. Neck supple.  Cardiovascular: Regular rhythm, S1 normal and S2 normal.  Exam reveals no gallop and no friction rub.   No murmur heard. Pulmonary/Chest: Effort normal and breath sounds normal. No respiratory distress. She exhibits no tenderness.  Abdominal: Soft. Normal appearance and bowel sounds are normal. There is no hepatosplenomegaly. There is tenderness (epigastric). There is no rebound, no guarding, no tenderness at McBurney's point and negative Murphy's sign. No hernia.  Musculoskeletal: Normal range of motion.  Neurological: She is alert and oriented to person, place, and time. She has normal strength. No cranial nerve deficit or sensory deficit. Coordination normal. GCS eye subscore is 4. GCS verbal subscore is 5. GCS motor subscore is 6.  Skin: Skin is warm, dry and intact. No rash noted. No cyanosis.  Psychiatric: She has a normal mood and affect. Her speech is normal and behavior is normal. Thought content normal.  Nursing note and vitals reviewed.   ED Treatments / Results  Labs (all labs ordered are listed, but only abnormal results are displayed) Labs Reviewed  LIPASE, BLOOD - Abnormal; Notable for the following:       Result Value   Lipase 71  (*)    All other components within normal limits  COMPREHENSIVE METABOLIC PANEL - Abnormal; Notable for the following:    Glucose, Bld 335 (*)    BUN 25 (*)    Creatinine, Ser 1.55 (*)    GFR calc non Af Amer 34 (*)    GFR calc Af Amer 40 (*)    All other components within normal limits  CBC - Abnormal; Notable for the following:    RDW 15.6 (*)    All other components within normal limits  URINALYSIS, ROUTINE W REFLEX MICROSCOPIC (NOT AT [facility]) - Abnormal; Notable  for the following:    APPearance CLOUDY (*)    Glucose, UA >1000 (*)    Leukocytes, UA MODERATE (*)    All other components within normal limits  URINE MICROSCOPIC-ADD ON - Abnormal; Notable for the following:    Squamous Epithelial / LPF 0-5 (*)    Bacteria, UA RARE (*)    All other components within normal limits    EKG  EKG Interpretation None       Radiology No results found.  Procedures Procedures (including critical care time)  DIAGNOSTIC STUDIES: Oxygen Saturation is 98% on RA, normal by my interpretation.    COORDINATION OF CARE: 11:57 PM Discussed treatment plan with pt at bedside and pt agreed to plan.  Medications Ordered in ED Medications  sodium chloride 0.9 % bolus 500 mL (0 mLs Intravenous Stopped 08/16/16 0314)  ondansetron (ZOFRAN) injection 4 mg (4 mg Intravenous Given 08/16/16 0208)  famotidine (PEPCID) IVPB 20 mg premix (0 mg Intravenous Stopped 08/16/16 0304)  gi cocktail (Maalox,Lidocaine,Donnatal) (30 mLs Oral Given 08/16/16 0102)  labetalol (NORMODYNE,TRANDATE) injection 10 mg (10 mg Intravenous Given 08/16/16 0307)  HYDROmorphone (DILAUDID) injection 1 mg (1 mg Intravenous Given 08/16/16 0305)  ondansetron (ZOFRAN) injection 4 mg (4 mg Intravenous Given 08/16/16 0313)     Initial Impression / Assessment and Plan / ED Course  I have reviewed the triage vital signs and the nursing notes.  Pertinent labs & imaging results that were available during my care of the patient were  reviewed by me and considered in my medical decision making (see chart for details).  Clinical Course   Patient tells me that her gastroenterologist told her to come to the ER and a GI doctor would see her and she will be admitted to the hospital. Reviewing the notes, however, does not show any evidence for this. Patient has had ongoing issues with nausea, vomiting, diarrhea and abdominal pain. She was seen in the ER several weeks ago and had a CAT scan that was normal. At that time it was felt that she was experiencing acid reflux and gastritis type symptoms. He was recommended she follow up with GI. At that follow-up study she did have a C. difficile study that was normal. It's not clear what the further plan is for the patient. Clearly she has an ongoing and somewhat chronic issue requiring GI intervention, not an emergency at this time. She did have some renal insufficiency at her previous visit but her kidney function is much improved today, indicating that she likely is holding down fluids. Patient administered analgesia, GI cocktail, antiemetics here in the ER. She was hypertensive, this was treated with labetalol and has significantly improved.  He is requesting admission, at this time I do not see any indication for admission. She is not happy with this decision, but unfortunately she will need to be discharged and will have to follow up with her gastroenterologist and primary care physician.  I personally performed the services described in this documentation, which was scribed in my presence. The recorded information has been reviewed and is accurate.   Final Clinical Impressions(s) / ED Diagnoses   Final diagnoses:  Nausea vomiting and diarrhea  Gastritis without bleeding, unspecified chronicity, unspecified gastritis type  Essential hypertension    New Prescriptions New Prescriptions   DIPHENOXYLATE-ATROPINE (LOMOTIL) 2.5-0.025 MG TABLET    Take 2 tablets by mouth 4 (four) times  daily as needed for diarrhea or loose stools.   HYOSCYAMINE SULFATE SL (LEVSIN/SL) 0.125 MG  SUBL    Place 0.125 mg under the tongue every 4 (four) hours as needed (abd pain).   ONDANSETRON (ZOFRAN) 4 MG TABLET    Take 1 tablet (4 mg total) by mouth every 6 (six) hours.   PANTOPRAZOLE (PROTONIX) 20 MG TABLET    Take 1 tablet (20 mg total) by mouth 2 (two) times daily.   SUCRALFATE (CARAFATE) 1 GM/10ML SUSPENSION    Take 10 mLs (1 g total) by mouth 4 (four) times daily -  with meals and at bedtime.     Orpah Greek, MD 08/16/16 [phone]

## 2016-08-16 DIAGNOSIS — K2970 Gastritis, unspecified, without bleeding: Secondary | ICD-10-CM | POA: Diagnosis not present

## 2016-08-16 LAB — URINE MICROSCOPIC-ADD ON

## 2016-08-16 LAB — URINALYSIS, ROUTINE W REFLEX MICROSCOPIC
Bilirubin Urine: NEGATIVE
Hgb urine dipstick: NEGATIVE
Ketones, ur: NEGATIVE mg/dL
Nitrite: NEGATIVE
PH: 6 (ref 5.0–8.0)
Protein, ur: NEGATIVE mg/dL
SPECIFIC GRAVITY, URINE: 1.019 (ref 1.005–1.030)

## 2016-08-16 MED ORDER — GI COCKTAIL ~~LOC~~
30.0000 mL | Freq: Once | ORAL | Status: AC
  Administered 2016-08-16: 30 mL via ORAL
  Filled 2016-08-16: qty 30

## 2016-08-16 MED ORDER — PANTOPRAZOLE SODIUM 20 MG PO TBEC: 20.0000 mg | DELAYED_RELEASE_TABLET | Freq: Two times a day (BID) | ORAL | 2 refills | Status: DC

## 2016-08-16 MED ORDER — HYOSCYAMINE SULFATE SL 0.125 MG SL SUBL: 0.1250 mg | SUBLINGUAL_TABLET | SUBLINGUAL | 0 refills | Status: DC | PRN

## 2016-08-16 MED ORDER — SUCRALFATE 1 GM/10ML PO SUSP: 1.0000 g | Freq: Three times a day (TID) | ORAL | 0 refills | Status: DC

## 2016-08-16 MED ORDER — ONDANSETRON HCL 4 MG/2ML IJ SOLN
4.0000 mg | Freq: Once | INTRAMUSCULAR | Status: AC
  Administered 2016-08-16: 4 mg via INTRAVENOUS
  Filled 2016-08-16: qty 2

## 2016-08-16 MED ORDER — SODIUM CHLORIDE 0.9 % IV BOLUS (SEPSIS)
500.0000 mL | Freq: Once | INTRAVENOUS | Status: AC
  Administered 2016-08-16: 500 mL via INTRAVENOUS

## 2016-08-16 MED ORDER — ONDANSETRON HCL 4 MG PO TABS: 4.0000 mg | ORAL_TABLET | Freq: Four times a day (QID) | ORAL | 0 refills | Status: DC

## 2016-08-16 MED ORDER — HYDROMORPHONE HCL 1 MG/ML IJ SOLN
1.0000 mg | Freq: Once | INTRAMUSCULAR | Status: AC
  Administered 2016-08-16: 1 mg via INTRAVENOUS
  Filled 2016-08-16: qty 1

## 2016-08-16 MED ORDER — DIPHENOXYLATE-ATROPINE 2.5-0.025 MG PO TABS: 2.0000 | ORAL_TABLET | Freq: Four times a day (QID) | ORAL | 0 refills | Status: DC | PRN

## 2016-08-16 MED ORDER — FAMOTIDINE IN NACL 20-0.9 MG/50ML-% IV SOLN
20.0000 mg | Freq: Once | INTRAVENOUS | Status: AC
  Administered 2016-08-16: 20 mg via INTRAVENOUS
  Filled 2016-08-16: qty 50

## 2016-08-16 MED ORDER — LABETALOL HCL 5 MG/ML IV SOLN
10.0000 mg | Freq: Once | INTRAVENOUS | Status: AC
  Administered 2016-08-16: 10 mg via INTRAVENOUS
  Filled 2016-08-16: qty 4

## 2016-08-16 NOTE — ED Notes (Signed)
IVT @ BS.

## 2016-08-16 NOTE — Progress Notes (Signed)
Agree with Ms. Guenther's assessment and plan. Kijana Estock E. Foday [facility], MD, FACG   

## 2016-08-16 NOTE — ED Notes (Signed)
IV attempted x2 without success.

## 2016-08-16 NOTE — ED Notes (Signed)
Patient resting comfortably in bed in no distress. Patient given written and verbal discharge instructions. Husband transported patient home, patient has scheduled appointment with GI on December 11,2017.

## 2016-08-17 ENCOUNTER — Other Ambulatory Visit: Payer: Self-pay

## 2016-08-17 ENCOUNTER — Telehealth: Payer: Self-pay | Admitting: Nurse Practitioner

## 2016-08-17 ENCOUNTER — Telehealth: Payer: Self-pay | Admitting: *Deleted

## 2016-08-17 MED ORDER — METOCLOPRAMIDE HCL 5 MG PO TABS
5.0000 mg | ORAL_TABLET | Freq: Two times a day (BID) | ORAL | 0 refills | Status: DC
Start: 2016-08-17 — End: 2016-11-30

## 2016-08-17 NOTE — Telephone Encounter (Signed)
Pharmacy called related to several Rx not being covered by insurance...EDCM clarified with EDP and Pharm D to change Rx to Rx covered by insurance.

## 2016-08-17 NOTE — Telephone Encounter (Signed)
She is scheduled to follow up with Dr Carlean Purl in December. What would you like to do. I do not have anything else to offer her as far as supportive measures at home.

## 2016-08-17 NOTE — Telephone Encounter (Signed)
Left message for patient to call back  

## 2016-08-17 NOTE — Telephone Encounter (Signed)
Patient states that she went to the ED(please see previous note). She states that the ED physician wrote her medications. She states that she cannot afford copay on these medications, they are too expensive. She is wanting to know if Dr. Carlean Purl would prescribe her something less expensive.   Patient states that the ED physician told her that our office needs to go ahead and schedule patient an EGD to check for stomach ulcers.  Best # to reach patient is [phone]

## 2016-08-20 NOTE — Telephone Encounter (Signed)
Patient will come back in and see Tye Savoy RNP tomorrow at 3:00

## 2016-08-21 ENCOUNTER — Encounter: Payer: Self-pay | Admitting: Nurse Practitioner

## 2016-08-21 ENCOUNTER — Other Ambulatory Visit (INDEPENDENT_AMBULATORY_CARE_PROVIDER_SITE_OTHER): Payer: Medicare Other

## 2016-08-21 ENCOUNTER — Ambulatory Visit (INDEPENDENT_AMBULATORY_CARE_PROVIDER_SITE_OTHER): Payer: Medicare Other | Admitting: Nurse Practitioner

## 2016-08-21 ENCOUNTER — Encounter (INDEPENDENT_AMBULATORY_CARE_PROVIDER_SITE_OTHER): Payer: Self-pay

## 2016-08-21 VITALS — BP 130/80 | HR 78 | Ht 63.0 in | Wt 167.6 lb

## 2016-08-21 DIAGNOSIS — R1010 Upper abdominal pain, unspecified: Secondary | ICD-10-CM | POA: Diagnosis not present

## 2016-08-21 DIAGNOSIS — R197 Diarrhea, unspecified: Secondary | ICD-10-CM

## 2016-08-21 DIAGNOSIS — R112 Nausea with vomiting, unspecified: Secondary | ICD-10-CM

## 2016-08-21 LAB — URINALYSIS WITH CULTURE, IF INDICATED
Bilirubin Urine: NEGATIVE
Hgb urine dipstick: NEGATIVE
KETONES UR: NEGATIVE
LEUKOCYTES UA: NEGATIVE
Nitrite: NEGATIVE
PH: 5.5 (ref 5.0–8.0)
TOTAL PROTEIN, URINE-UPE24: NEGATIVE
UROBILINOGEN UA: 0.2 (ref 0.0–1.0)
Urine Glucose: 100 — AB

## 2016-08-21 MED ORDER — NA SULFATE-K SULFATE-MG SULF 17.5-3.13-1.6 GM/177ML PO SOLN: 1.0000 | Freq: Once | ORAL | 0 refills | Status: AC

## 2016-08-21 NOTE — Progress Notes (Signed)
HPI: This is a 65 year old female who I saw a couple of weeks ago after not being seen here since 2012. I saw her for nausea, vomiting and diarrhea (refer to 08/06/16 note).  Patient went back to ED on 10/18 with ongoing symptoms. She got IVF and Pepcid (didn't help) and dilaudid. Labs showed that her renal function had continued to improve. Lipase was up some to 71, wBC normal. Hgb normal at 12. Glucose was 335.  had She left feeling better but had recurrent symptoms the following day including epigastric pain radiating around to left side. Sees whole pieces of food such as eggs , tomatoes, and pills in emesis and stool. Can't keep down antiemetics.   Her weight is up a pound or two since last visit.    Past Medical History:  Diagnosis Date  . B12 deficiency   . Bronchitis, not specified as acute or chronic   . Cellulitis and abscess    abdomen and buttocks  . Depression   . Diabetes mellitus   . Diabetic neuropathy ([place])   . Disorders of phosphorus metabolism   . DJD (degenerative joint disease)    right forminal stenosis C4-5  . Edema   . Essential hypertension   . Folliculitis   . Gout   . Headache(784.0)   . Hiatal hernia   . Hypopotassemia   . Insomnia   . Irritable bowel syndrome   . Osteoarthrosis, unspecified whether generalized or localized, unspecified site   . Palpitations   . Pure hypercholesterolemia   . Reflux esophagitis   . Renal failure, unspecified   . Unspecified tinnitus    right    Patient's surgical history, family medical history, social history, medications, and allergies were all reviewed in Epic   Physical Exam: BP 130/80   Pulse 78   Ht 5\' 3"  (1.6 m)   Wt 167 lb 9.6 oz (76 kg)   BMI 29.69 kg/m   GENERAL: Well developed white female in NAD PSYCH: :Pleasant, cooperative, flat affect HEENT: Normocephalic, conjunctiva pink, mucous membranes moist, neck supple without masses CARDIAC:  RRR,  nomurmur heard, no peripheral edema PULM:  Normal respiratory effort, lungs CTA bilaterally, no wheezing ABDOMEN:  soft, nontender, nondistended, no obvious masses, no hepatomegaly,  normal bowel sounds SKIN:  turgor, no lesions seen Musculoskeletal: normal muscle tone, normal  strength NEURO: Alert and oriented x 3, no focal neurologic deficits  ASSESSMENT and PLAN:  65 year old female with several week history of nausea, vomiting, diarrhea, intermittent fevers and LUQ pain radiating around to left back. Two recent ED visits for symptoms. Recently started Victoza, no other med changes. C-diff negative. LFTs normal. Lipase 71. Noncontrast CTscan unrevealing.   -Persistent symptoms so will schedule her for EGD and colonoscopy. Will document healing of prepyloric ulcer found on inpatient EGD by [facility] in March of this year. Her last colonoscopy was in 2012 and there was  concern for Crohn's based on ileal findings. Workup wasn't concluded but patient began feeling better and didn't come back. The risks and benefits of EGD and colonoscopy were discussed and the patient agrees to proceed. -Was going to order IBD serologies but postponed that in hopes that EGD / colonoscopy may give [place] an answer.   -If endoscopic workup negative consider repeat gastric emptying study which was abnormal a few years ago but it wasn't clear if taking meds with potential to delay gastric emptying. I started low dose Reglan BID but patient  not sure if it has helped.

## 2016-08-21 NOTE — Patient Instructions (Signed)
If you are age 64 or older, your body mass index should be between 23-30. Your Body mass index is 29.69 kg/m. If this is out of the aforementioned range listed, please consider follow up with your Primary Care Provider.  If you are age 101 or younger, your body mass index should be between 19-25. Your Body mass index is 29.69 kg/m. If this is out of the aformentioned range listed, please consider follow up with your Primary Care Provider.   You have been scheduled for an endoscopy and colonoscopy. Please follow the written instructions given to you at your visit today. Please pick up your prep supplies at the pharmacy within the next 1-3 days. If you use inhalers (even only as needed), please bring them with you on the day of your procedure. Your physician has requested that you go to www.startemmi.com and enter the access code given to you at your visit today. This web site gives a general overview about your procedure. However, you should still follow specific instructions given to you by our office regarding your preparation for the procedure.  Your provider has requested that you go to the basement forlab work before leaving today.   Thank you for choosing [facility]

## 2016-08-23 NOTE — Progress Notes (Signed)
I agree with plans per Ms. Chester Holstein

## 2016-08-24 ENCOUNTER — Telehealth: Payer: Self-pay | Admitting: Nurse Practitioner

## 2016-08-24 NOTE — Telephone Encounter (Signed)
Faxed rebate card for $50 to [facility] in [place]. Left message informing pt.

## 2016-08-29 ENCOUNTER — Ambulatory Visit (AMBULATORY_SURGERY_CENTER): Payer: Medicare Other | Admitting: Internal Medicine

## 2016-08-29 ENCOUNTER — Encounter (HOSPITAL_COMMUNITY): Payer: Self-pay | Admitting: Emergency Medicine

## 2016-08-29 ENCOUNTER — Encounter: Payer: Self-pay | Admitting: Internal Medicine

## 2016-08-29 ENCOUNTER — Emergency Department (HOSPITAL_COMMUNITY): Payer: Medicare Other

## 2016-08-29 ENCOUNTER — Emergency Department (HOSPITAL_COMMUNITY)
Admission: EM | Admit: 2016-08-29 | Discharge: 2016-08-29 | Disposition: A | Discharge status: Home / Self Care | Payer: Medicare Other | Attending: Emergency Medicine | Admitting: Emergency Medicine

## 2016-08-29 VITALS — BP 162/88 | HR 95 | Temp 98.2°F | Resp 22 | Ht 63.0 in | Wt 167.0 lb

## 2016-08-29 DIAGNOSIS — Z8719 Personal history of other diseases of the digestive system: Secondary | ICD-10-CM

## 2016-08-29 DIAGNOSIS — Z794 Long term (current) use of insulin: Secondary | ICD-10-CM | POA: Diagnosis not present

## 2016-08-29 DIAGNOSIS — N183 Chronic kidney disease, stage 3 (moderate): Secondary | ICD-10-CM | POA: Insufficient documentation

## 2016-08-29 DIAGNOSIS — I129 Hypertensive chronic kidney disease with stage 1 through stage 4 chronic kidney disease, or unspecified chronic kidney disease: Secondary | ICD-10-CM | POA: Insufficient documentation

## 2016-08-29 DIAGNOSIS — R197 Diarrhea, unspecified: Secondary | ICD-10-CM | POA: Diagnosis not present

## 2016-08-29 DIAGNOSIS — I10 Essential (primary) hypertension: Secondary | ICD-10-CM

## 2016-08-29 DIAGNOSIS — Z96659 Presence of unspecified artificial knee joint: Secondary | ICD-10-CM | POA: Diagnosis not present

## 2016-08-29 DIAGNOSIS — E1140 Type 2 diabetes mellitus with diabetic neuropathy, unspecified: Secondary | ICD-10-CM | POA: Diagnosis not present

## 2016-08-29 DIAGNOSIS — R519 Headache, unspecified: Secondary | ICD-10-CM

## 2016-08-29 DIAGNOSIS — R51 Headache: Secondary | ICD-10-CM | POA: Insufficient documentation

## 2016-08-29 DIAGNOSIS — R112 Nausea with vomiting, unspecified: Secondary | ICD-10-CM

## 2016-08-29 DIAGNOSIS — Z8711 Personal history of peptic ulcer disease: Secondary | ICD-10-CM

## 2016-08-29 LAB — CBC WITH DIFFERENTIAL/PLATELET
BASOS ABS: 0 10*3/uL (ref 0.0–0.1)
Basophils Relative: 1 %
Eosinophils Absolute: 0.1 10*3/uL (ref 0.0–0.7)
Eosinophils Relative: 1 %
HEMATOCRIT: 36.5 % (ref 36.0–46.0)
Hemoglobin: 12.5 g/dL (ref 12.0–15.0)
LYMPHS ABS: 1.8 10*3/uL (ref 0.7–4.0)
LYMPHS PCT: 23 %
MCH: 29.3 pg (ref 26.0–34.0)
MCHC: 34.2 g/dL (ref 30.0–36.0)
MCV: 85.7 fL (ref 78.0–100.0)
MONO ABS: 0.4 10*3/uL (ref 0.1–1.0)
MONOS PCT: 5 %
NEUTROS ABS: 5.5 10*3/uL (ref 1.7–7.7)
Neutrophils Relative %: 71 %
Platelets: 213 10*3/uL (ref 150–400)
RBC: 4.26 MIL/uL (ref 3.87–5.11)
RDW: 15 % (ref 11.5–15.5)
WBC: 7.7 10*3/uL (ref 4.0–10.5)

## 2016-08-29 LAB — BASIC METABOLIC PANEL
ANION GAP: 16 — AB (ref 5–15)
BUN: 23 mg/dL — ABNORMAL HIGH (ref 6–20)
CHLORIDE: 98 mmol/L — AB (ref 101–111)
CO2: 19 mmol/L — AB (ref 22–32)
Calcium: 9.7 mg/dL (ref 8.9–10.3)
Creatinine, Ser: 1.76 mg/dL — ABNORMAL HIGH (ref 0.44–1.00)
GFR calc Af Amer: 34 mL/min — ABNORMAL LOW (ref >60)
GFR, EST NON AFRICAN AMERICAN: 29 mL/min — AB (ref >60)
GLUCOSE: 291 mg/dL — AB (ref 65–99)
POTASSIUM: 3.3 mmol/L — AB (ref 3.5–5.1)
Sodium: 133 mmol/L — ABNORMAL LOW (ref 135–145)

## 2016-08-29 LAB — GLUCOSE, CAPILLARY
GLUCOSE-CAPILLARY: 161 mg/dL — AB (ref 65–99)
Glucose-Capillary: 177 mg/dL — ABNORMAL HIGH (ref 65–99)

## 2016-08-29 MED ORDER — KETOROLAC TROMETHAMINE 30 MG/ML IJ SOLN
30.0000 mg | Freq: Once | INTRAMUSCULAR | Status: AC
  Administered 2016-08-29: 30 mg via INTRAVENOUS
  Filled 2016-08-29: qty 1

## 2016-08-29 MED ORDER — PROCHLORPERAZINE EDISYLATE 5 MG/ML IJ SOLN
10.0000 mg | Freq: Once | INTRAMUSCULAR | Status: AC
  Administered 2016-08-29: 10 mg via INTRAVENOUS
  Filled 2016-08-29: qty 2

## 2016-08-29 MED ORDER — SODIUM CHLORIDE 0.9 % IV SOLN: 500.0000 mL | INTRAVENOUS | Status: DC

## 2016-08-29 MED ORDER — SODIUM CHLORIDE 0.9 % IV BOLUS (SEPSIS)
500.0000 mL | Freq: Once | INTRAVENOUS | Status: AC
  Administered 2016-08-29: 500 mL via INTRAVENOUS

## 2016-08-29 MED ORDER — PANTOPRAZOLE SODIUM 40 MG PO TBEC
40.0000 mg | DELAYED_RELEASE_TABLET | Freq: Every day | ORAL | Status: DC
  Administered 2016-08-29: 40 mg via ORAL
  Filled 2016-08-29: qty 1

## 2016-08-29 NOTE — Progress Notes (Signed)
To PACU  Pt awake and Alert. Report to RN

## 2016-08-29 NOTE — Op Note (Signed)
[facility] Patient Name: Heather [name] Procedure Date: 08/29/2016 3:28 PM MRN: [id number] Endoscopist: Gatha Mayer , MD Age: 65 Referring MD:  Date of Birth: 08/28/1951 Gender: Female Account #: [id number] Procedure:                Colonoscopy Indications:              Clinically significant diarrhea of unexplained                            origin Medicines:                Propofol per Anesthesia, Monitored Anesthesia Care Procedure:                Pre-Anesthesia Assessment:                           - Prior to the procedure, a History and Physical                            was performed, and patient medications and                            allergies were reviewed. The patient's tolerance of                            previous anesthesia was also reviewed. The risks                            and benefits of the procedure and the sedation                            options and risks were discussed with the patient.                            All questions were answered, and informed consent                            was obtained. Prior Anticoagulants: The patient has                            taken no previous anticoagulant or antiplatelet                            agents. ASA Grade Assessment: III - A patient with                            severe systemic disease. After reviewing the risks                            and benefits, the patient was deemed in                            satisfactory condition to undergo the procedure.  After obtaining informed consent, the colonoscope                            was passed under direct vision. Throughout the                            procedure, the patient's blood pressure, pulse, and                            oxygen saturations were monitored continuously. The                            Model PCF-[id number]) scope was introduced                            through the anus and  advanced to the the terminal                            ileum, with identification of the appendiceal                            orifice and IC valve. The colonoscopy was performed                            without difficulty. The patient tolerated the                            procedure fairly well. The quality of the bowel                            preparation was good. The bowel preparation used                            was Miralax. The terminal ileum, ileocecal valve,                            appendiceal orifice, and rectum were photographed. Scope In: 3:42:02 PM Scope Out: 4:04:13 PM Scope Withdrawal Time: 0 hours 10 minutes 16 seconds  Total Procedure Duration: 0 hours 22 minutes 11 seconds  Findings:                 The perianal and digital rectal examinations were                            normal.                           The terminal ileum appeared normal.                           The colon (entire examined portion) appeared                            normal. Biopsies for histology were taken with a  cold forceps from the right colon and left colon                            for evaluation of microscopic colitis. Verification                            of patient identification for the specimen was                            done. Estimated blood loss was minimal. Complications:            No immediate complications. Estimated Blood Loss:     Estimated blood loss was minimal. Impression:               - The examined portion of the ileum was normal.                           - The entire examined colon is normal. Biopsied. Recommendation:           - Patient has a contact number available for                            emergencies. The signs and symptoms of potential                            delayed complications were discussed with the                            patient. Return to normal activities tomorrow.                             Written discharge instructions were provided to the                            patient.                           - Await pathology results.                           - Repeat colonoscopy is recommended. The                            colonoscopy date will be determined after pathology                            results from today's exam become available for                            review.                           - Gastroparesis diet.                           -  NO INFLAMMATION SEEN.                           DOES HAVE ANGULATED SIGMOID THAT REQUIRED SEVERAL                            MINUTES, WATER INFUSION AND ABDOMINAL PRESSURE TO                            TRAVERSE.                           AWAIT THESE BXS - HAD MICROSCOPIC INFLAMMATION IN                            2012.                           SXS SEEM MORE LIKE IBS WITH GASTROPARESIS TO ME BUT                            COULD HAVE MICROSCOPIC COLITIS.                           - Continue present medications. Gatha Mayer, MD 08/29/2016 4:19:37 PM This report has been signed electronically.

## 2016-08-29 NOTE — ED Notes (Signed)
IV Team at bedside 

## 2016-08-29 NOTE — Progress Notes (Signed)
Upon arrival to [facility]- pt c/o abd pain as a "10."  Abdomen soft, easily palpable.  Passing air without difficulty.  She came in [facility] with abd pain as a "9" and states pain she is having now is the same as she has been having

## 2016-08-29 NOTE — ED Provider Notes (Signed)
[facility] DEPT Provider Note   CSN: [id number] Arrival date & time: 08/29/16  1833     History   Chief Complaint Chief Complaint  Patient presents with  . Headache  . Hypertension    HPI Heather [name] is a 65 y.o. female.  He presents for evaluation of post endoscopy headache and hypertension. This was noticed during the recovery, following sedation with propofol. She states that she took her usual morning medications today. She was treated with labetalol, fentanyl, partial improvement, but she feels like the headache is returning. She denies fever, chills, cough, chest pain, weakness or paresthesias. She has ongoing nausea and vomiting as many as 7-10 times a day, "for 5 weeks." She states that she has been taking her medicines as directed, with the exception of Nexium, which she has run out of. There are no other known modifying factors.  HPI  Past Medical History:  Diagnosis Date  . B12 deficiency   . Bronchitis, not specified as acute or chronic   . Cellulitis and abscess    abdomen and buttocks  . Depression   . Diabetes mellitus   . Diabetic neuropathy ([facility] [place])   . Disorders of phosphorus metabolism   . DJD (degenerative joint disease)    right forminal stenosis C4-5  . Edema   . Essential hypertension   . Folliculitis   . Gout   . Headache(784.0)   . Hiatal hernia   . Hypopotassemia   . Insomnia   . Irritable bowel syndrome   . Osteoarthrosis, unspecified whether generalized or localized, unspecified site   . Palpitations   . Pure hypercholesterolemia   . Reflux esophagitis   . Renal failure, unspecified   . Unspecified tinnitus    right    Patient Active Problem List   Diagnosis Date Noted  . Acute renal failure (ARF) ([place]) 01/25/2016  . Hematemesis 01/21/2016  . Melena 01/21/2016  . Upper GI bleed 01/21/2016  . Acute renal failure superimposed on stage 3 chronic kidney disease ([place]) 01/21/2016  . Coffee ground emesis 01/20/2016  .  Faintness 04/29/2015  . Awareness alteration, transient 03/02/2015  . Neuropathy ([place]) 03/02/2015  . Pain in the chest   . Chest pain 12/22/2014  . Essential hypertension 12/22/2014  . Nausea & vomiting 12/22/2014  . Diabetes mellitus ([place]) 12/22/2014  . Hiatal hernia 12/22/2014  . GERD (gastroesophageal reflux disease) 12/22/2014  . Gout 12/22/2014  . HLD (hyperlipidemia) 12/22/2014  . Depression 12/22/2014  . Chronic kidney disease, stage III (moderate) 08/25/2014  . Vitamin B12 deficiency 07/08/2011  . Iron deficiency anemia 05/10/2011  . Personal history of failed moderate sedation 04/16/2011  . Chronic Nausea and Vomiting - ? gastroapresis 04/16/2011  . Early satiety 04/16/2011  . ? Crohn's disease 04/16/2011    Class: Question of  . Irritable bowel syndrome 04/16/2011  . Benign paroxysmal positional vertigo 11/20/2010  . TRICUSPID REGURGITATION, MODERATE 11/20/2010  . PULMONARY HYPERTENSION, MILD 11/20/2010  . LEG EDEMA, BILATERAL 11/20/2010  . DYSPNEA 11/20/2010  . NONSPECIFIC ABNORMAL ELECTROCARDIOGRAM 11/20/2010  . CHEST WALL PAIN, HX OF 11/20/2010    Past Surgical History:  Procedure Laterality Date  . c-spine surgery     03/2007  . CARDIAC CATHETERIZATION    . CHOLECYSTECTOMY    . COLONOSCOPY  04/21/2011; 05/29/11   6/12: [place] - ?AVM at IC valve, inflammatory changes at [facility] valve [facility]); 7/12 - [facility]; IC valve erosions, look chronic and probably Crohn's per path  . ESOPHAGOGASTRODUODENOSCOPY  05/29/11  normal  . ESOPHAGOGASTRODUODENOSCOPY N/A 01/21/2016   Procedure: ESOPHAGOGASTRODUODENOSCOPY (EGD);  Surgeon: Wilford Corner, MD;  Location[facility] ENDOSCOPY;  Service: Endoscopy;  Laterality: N/A;  . JOINT REPLACEMENT    . Right knee arthroscopic surgery    . TOTAL ABDOMINAL HYSTERECTOMY      OB History    No data available       Home Medications    Prior to Admission medications   Medication Sig Start Date End Date Taking? Authorizing  Provider  allopurinol (ZYLOPRIM) 100 MG tablet Take 100 mg by mouth daily.   Yes Historical Provider, MD  citalopram (CELEXA) 40 MG tablet Take 40 mg by mouth daily.    Yes Historical Provider, MD  cyanocobalamin (,VITAMIN B-12,) 1000 MCG/ML injection Inject 1,000 mcg into the muscle every 30 (thirty) days.     Yes Historical Provider, MD  diazepam (VALIUM) 5 MG tablet Take 5 mg by mouth 2 (two) times daily as needed for anxiety.   Yes Historical Provider, MD  diphenoxylate-atropine (LOMOTIL) 2.5-0.025 MG tablet Take 2 tablets by mouth 4 (four) times daily as needed for diarrhea or loose stools. 08/16/16  Yes Orpah Greek, MD  esomeprazole (NEXIUM) 40 MG capsule Take 40 mg by mouth daily before breakfast.     Yes Historical Provider, MD  furosemide (LASIX) 40 MG tablet Take 1 tablet (40 mg total) by mouth daily. 01/26/16  Yes Reyne Dumas, MD  insulin detemir (LEVEMIR) 100 UNIT/ML injection Inject 0.6 mLs (60 Units total) into the skin at bedtime. Patient taking differently: Inject 90 Units into the skin at bedtime.  01/25/16  Yes Reyne Dumas, MD  insulin lispro (HUMALOG KWIKPEN) 100 UNIT/ML SOPN Inject 10-50 Units into the skin 3 (three) times daily with meals. <70= no coverage 70-150= 10 units 151-200=15units 201-250=20units 251-300=25units 301-350=30units 351-400=35units 401-450=40units >450= 50 units   Yes Historical Provider, MD  Liraglutide (VICTOZA) 18 MG/3ML SOPN Inject 0.6 mg into the skin daily.   Yes Historical Provider, MD  metoCLOPramide (REGLAN) 5 MG tablet Take 1 tablet (5 mg total) by mouth 2 (two) times daily before a meal. 08/17/16  Yes Willia Craze, NP  metoprolol (LOPRESSOR) 100 MG tablet Take 100 mg by mouth 2 (two) times daily.    Yes Historical Provider, MD  potassium chloride SA (K-DUR,KLOR-CON) 20 MEQ tablet Take 20 mEq by mouth daily.   Yes Historical Provider, MD  sucralfate (CARAFATE) 1 GM/10ML suspension Take 10 mLs (1 g total) by mouth 4 (four) times  daily -  with meals and at bedtime. 08/16/16  Yes Orpah Greek, MD  tiZANidine (ZANAFLEX) 4 MG tablet Take 8 mg by mouth at bedtime.    Yes Historical Provider, MD  Hyoscyamine Sulfate SL (LEVSIN/SL) 0.125 MG SUBL Place 0.125 mg under the tongue every 4 (four) hours as needed (abd pain). Patient not taking: Reported on 08/29/2016 08/16/16   Orpah Greek, MD  ondansetron (ZOFRAN) 4 MG tablet Take 1 tablet (4 mg total) by mouth every 6 (six) hours. Patient not taking: Reported on 08/29/2016 08/16/16   Orpah Greek, MD  pantoprazole (PROTONIX) 20 MG tablet Take 1 tablet (20 mg total) by mouth 2 (two) times daily. Patient not taking: Reported on 08/29/2016 08/16/16   Orpah Greek, MD    Family History Family History  Problem Relation Age of Onset  . Diabetes Mother   . Colon cancer Neg Hx     Social History Social History  Substance Use Topics  . Smoking status:  Never Smoker  . Smokeless tobacco: Never Used  . Alcohol use No     Allergies   Duloxetine hcl; Fluocinolone; Amlodipine besylate; Ciprofloxacin; Hydrocodone-acetaminophen; Metformin; Nsaids; Quinolones; Sulfamethoxazole; Sulfonamide derivatives; Valsartan; Cozaar [losartan]; and Lisinopril   Review of Systems Review of Systems  All other systems reviewed and are negative.    Physical Exam Updated Vital Signs BP 145/67   Pulse 90   Temp 98.9 F (37.2 C) (Oral)   Resp 18   SpO2 96%   Physical Exam  Constitutional: She is oriented to person, place, and time. She appears well-developed and well-nourished. No distress.  HENT:  Head: Normocephalic and atraumatic.  Eyes: Conjunctivae and EOM are normal. Pupils are equal, round, and reactive to light.  Neck: Normal range of motion and phonation normal. Neck supple.  Cardiovascular: Normal rate and regular rhythm.   Pulmonary/Chest: Effort normal and breath sounds normal. She exhibits no tenderness.  Abdominal: Soft. She exhibits no  distension. There is tenderness (Diffuse, mild). There is no guarding.  Musculoskeletal: Normal range of motion.  Neurological: She is alert and oriented to person, place, and time. She exhibits normal muscle tone.  No dysarthria and aphasia or nystagmus.  Skin: Skin is warm and dry.  Psychiatric: She has a normal mood and affect. Her behavior is normal. Judgment and thought content normal.  Nursing note and vitals reviewed.    ED Treatments / Results  Labs (all labs ordered are listed, but only abnormal results are displayed) Labs Reviewed  BASIC METABOLIC PANEL - Abnormal; Notable for the following:       Result Value   Sodium 133 (*)    Potassium 3.3 (*)    Chloride 98 (*)    CO2 19 (*)    Glucose, Bld 291 (*)    BUN 23 (*)    Creatinine, Ser 1.76 (*)    GFR calc non Af Amer 29 (*)    GFR calc Af Amer 34 (*)    Anion gap 16 (*)    All other components within normal limits  CBC WITH DIFFERENTIAL/PLATELET    EKG  EKG Interpretation None       Radiology Ct Head Wo Contrast  Result Date: 08/29/2016 CLINICAL DATA:  Right-sided headache, dizziness, and blurred vision for 1 day. Status post endoscopy. Hypertension. EXAM: CT HEAD WITHOUT CONTRAST TECHNIQUE: Contiguous axial images were obtained from the base of the skull through the vertex without intravenous contrast. COMPARISON:  10/25/2015 FINDINGS: Brain: No evidence of acute infarction, hemorrhage, hydrocephalus, extra-axial collection or mass lesion/mass effect. Vascular: No hyperdense vessel or unexpected calcification. Skull: Normal. Negative for fracture or focal lesion. Sinuses/Orbits: No acute finding. Other: None. IMPRESSION: Negative unenhanced head CT. Electronically Signed   By: Earle Gell M.D.   On: 08/29/2016 20:41    Procedures Procedures (including critical care time)  Medications Ordered in ED Medications  pantoprazole (PROTONIX) EC tablet 40 mg (40 mg Oral Given 08/29/16 1937)  sodium chloride 0.9 %  bolus 500 mL (0 mLs Intravenous Stopped 08/29/16 2226)  ketorolac (TORADOL) 30 MG/ML injection 30 mg (30 mg Intravenous Given 08/29/16 1937)  prochlorperazine (COMPAZINE) injection 10 mg (10 mg Intravenous Given 08/29/16 1937)     Initial Impression / Assessment and Plan / ED Course  I have reviewed the triage vital signs and the nursing notes.  Pertinent labs & imaging results that were available during my care of the patient were reviewed by me and considered in my medical decision making (see chart  for details).  Clinical Course    Medications  pantoprazole (PROTONIX) EC tablet 40 mg (40 mg Oral Given 08/29/16 1937)  sodium chloride 0.9 % bolus 500 mL (0 mLs Intravenous Stopped 08/29/16 2226)  ketorolac (TORADOL) 30 MG/ML injection 30 mg (30 mg Intravenous Given 08/29/16 1937)  prochlorperazine (COMPAZINE) injection 10 mg (10 mg Intravenous Given 08/29/16 1937)    Patient Vitals for the past 24 hrs:  BP Temp Temp src Pulse Resp SpO2  08/29/16 2200 145/67 - - 90 18 96 %  08/29/16 2115 151/71 - - 95 - 97 %  08/29/16 1945 169/65 - - 96 - 98 %  08/29/16 1930 (!) 159/54 - - 95 - 100 %  08/29/16 1929 172/77 - - 96 - 99 %  08/29/16 1836 130/98 98.9 F (37.2 C) Oral 102 18 100 %    At D/C-  Reevaluation with update and discussion. After initial assessment and treatment, an updated evaluation reveals She is feeling better at this time and wishes to go home. She has no additional complaints.Daleen Bo L    Final Clinical Impressions(s) / ED Diagnoses   Final diagnoses:  Hypertension, unspecified type  Nonintractable headache, unspecified chronicity pattern, unspecified headache type    Nonspecific headache and blood pressure elevation, improved after treatment. Doubt CVA, metabolic instability or severe bacterial infection.  Nursing Notes Reviewed/ Care Coordinated Applicable Imaging Reviewed Interpretation of Laboratory Data incorporated into ED treatment  The patient appears  reasonably screened and/or stabilized for discharge and I doubt any other medical condition or other [facility] requiring further screening, evaluation, or treatment in the ED at this time prior to discharge.  Plan: Home Medications- continue; Home Treatments- rest, gradually advance diet; return here if the recommended treatment, does not improve the symptoms; Recommended follow up- PCP prn   New Prescriptions Discharge Medication List as of 08/29/2016 10:15 PM       Daleen Bo, MD 08/29/16 2348

## 2016-08-29 NOTE — ED Triage Notes (Signed)
Per EMS: pt from endoscopy c/o htn and HA after procedure; pt with 22g R hand; pt had infiltration to IV placed by facility

## 2016-08-29 NOTE — ED Notes (Signed)
EDP at bedside  

## 2016-08-29 NOTE — Progress Notes (Signed)
Called to room to assist during endoscopic procedure.  Patient ID and intended procedure confirmed with present staff. Received instructions for my participation in the procedure from the performing physician.  

## 2016-08-29 NOTE — ED Notes (Signed)
Patient transported to CT 

## 2016-08-29 NOTE — ED Notes (Signed)
Pt back from Ct. No distress observed. IV team at bedside.

## 2016-08-29 NOTE — Patient Instructions (Addendum)
The stomach ulcer is healed. Esophagus, stomach and top of the intestine all look normal.  The colonoscopy looked ok also - I took biopsies to check for inflammation.  We know from 2012 tests that the stomach does not empty right. Reglan often helps that - we may need to retest and retry.  I will explain more after the biopsies are back from the colonoscopy.  It is important that you control your blood sugars.  Please follow a gastroparesis diet - see handout.  I appreciate the opportunity to care for you. Gatha Mayer, MD, FACG YOU HAD AN ENDOSCOPIC PROCEDURE TODAY AT [facility]:   Refer to the procedure report that was given to you for any specific questions about what was found during the examination.  If the procedure report does not answer your questions, please call your gastroenterologist to clarify.  If you requested that your care partner not be given the details of your procedure findings, then the procedure report has been included in a sealed envelope for you to review at your convenience later.  YOU SHOULD EXPECT: Some feelings of bloating in the abdomen. Passage of more gas than usual.  Walking can help get rid of the air that was put into your GI tract during the procedure and reduce the bloating. If you had a lower endoscopy (such as a colonoscopy or flexible sigmoidoscopy) you may notice spotting of blood in your stool or on the toilet paper. If you underwent a bowel prep for your procedure, you may not have a normal bowel movement for a few days.  Please Note:  You might notice some irritation and congestion in your nose or some drainage.  This is from the oxygen used during your procedure.  There is no need for concern and it should clear up in a day or so.  SYMPTOMS TO REPORT IMMEDIATELY:   Following lower endoscopy (colonoscopy or flexible sigmoidoscopy):  Excessive amounts of blood in the stool  Significant tenderness or worsening of  abdominal pains  Swelling of the abdomen that is new, acute  Fever of 100F or higher   Following upper endoscopy (EGD)  Vomiting of blood or coffee ground material  New chest pain or pain under the shoulder blades  Painful or persistently difficult swallowing  New shortness of breath  Fever of 100F or higher  Black, tarry-looking stools  For urgent or emergent issues, a gastroenterologist can be reached at any hour by calling [phone].   DIET:  We do recommend a small meal at first, but then you may proceed to your regular diet.  Drink plenty of fluids but you should avoid alcoholic beverages for 24 hours.  ACTIVITY:  You should plan to take it easy for the rest of today and you should NOT DRIVE or use heavy machinery until tomorrow (because of the sedation medicines used during the test).    FOLLOW UP: Our staff will call the number listed on your records the next business day following your procedure to check on you and address any questions or concerns that you may have regarding the information given to you following your procedure. If we do not reach you, we will leave a message.  However, if you are feeling well and you are not experiencing any problems, there is no need to return our call.  We will assume that you have returned to your regular daily activities without incident.  If any biopsies were taken you will be  contacted by phone or by letter within the next 1-3 weeks.  Please call us at [phone] if you have not heard about the biopsies in 3 weeks.   SIGNATURES/CONFIDENTIALITY: You and/or your care partner have signed paperwork which will be entered into your electronic medical record.  These signatures attest to the fact that that the information above on your After Visit Summary has been reviewed and is understood.  Full responsibility of the confidentiality of this discharge information lies with you and/or your care-partner.  Please read over handout about  gastroparesis diet and follow that diet  Please continue your normal medications

## 2016-08-29 NOTE — Op Note (Signed)
[facility] Patient Name: Heather [name] Procedure Date: 08/29/2016 3:28 PM MRN: [id number] Endoscopist: Gatha Mayer , MD Age: 65 Referring MD:  Date of Birth: [birth year] Gender: Female Account #: [id number] Procedure:                Upper GI endoscopy Indications:              Persistent vomiting Medicines:                Propofol per Anesthesia, Monitored Anesthesia Care Procedure:                Pre-Anesthesia Assessment:                           - Prior to the procedure, a History and Physical                            was performed, and patient medications and                            allergies were reviewed. The patient's tolerance of                            previous anesthesia was also reviewed. The risks                            and benefits of the procedure and the sedation                            options and risks were discussed with the patient.                            All questions were answered, and informed consent                            was obtained. Prior Anticoagulants: The patient has                            taken no previous anticoagulant or antiplatelet                            agents. ASA Grade Assessment: III - A patient with                            severe systemic disease. After reviewing the risks                            and benefits, the patient was deemed in                            satisfactory condition to undergo the procedure.                           After obtaining informed consent, the endoscope was  passed under direct vision. Throughout the                            procedure, the patient's blood pressure, pulse, and                            oxygen saturations were monitored continuously. The                            Model GIF-[id number]) scope was introduced                            through the mouth, and advanced to the second part                            of  duodenum. The upper GI endoscopy was                            accomplished without difficulty. The patient                            tolerated the procedure well. Scope In: Scope Out: Findings:                 The esophagus was normal.                           The stomach was normal.                           The examined duodenum was normal.                           The cardia and gastric fundus were normal on                            retroflexion. Complications:            No immediate complications. Estimated Blood Loss:     Estimated blood loss: none. Impression:               - Normal esophagus.                           - Normal stomach.                           - Normal examined duodenum.                           - No specimens collected. Recommendation:           - Patient has a contact number available for                            emergencies. The signs and symptoms of potential  delayed complications were discussed with the                            patient. Return to normal activities tomorrow.                            Written discharge instructions were provided to the                            patient.                           - Gastroparesis diet.                           - Continue present medications.                           - See the other procedure note for documentation of                            additional recommendations. Gatha Mayer, MD 08/29/2016 4:15:46 PM This report has been signed electronically.

## 2016-08-29 NOTE — Progress Notes (Signed)
Pt c/o headache before D/C.  Blood pressure elevated.  Dr. Carlean Purl at bedside and orders received to give Labetolol 20 mg and Fentanyl 50 mcg IV.  See flow sheet for VS and medications given.  [place] to discharge per Dr. Carlean Purl.  Pt's blood pressure better but headache still there.  Graham crackers given to help with abdominal pain. 1728- Pt states mid abdominal pain still there and headache still present  1732- still c/o severe headache, no relief from Fentanyl 50 mcg IV given.  Also, c/o nausea now.  Dr. Carlean Purl notified and order received to transfer to hospital.  Pt and husband made aware. 1753- transferred to hosptial per EMS.

## 2016-08-30 ENCOUNTER — Telehealth: Payer: Self-pay

## 2016-08-30 NOTE — Telephone Encounter (Signed)
Left a message at [phone] for the pt to call us if needed and we would try to call the pt bach this afternoon. maw

## 2016-09-03 NOTE — Progress Notes (Signed)
No inflammation in the colon - no Crohn's disease seen  I recommend Lotronex 0.5 mg twice a day for IBS-D # 60 no refill  Schedule f/u visit with me next available  Have her call with results of treatment in 2 weeks after starting - explain avoid Lomotil on this med and if get constipated or severe abdominal pain stop it and call us  [facility] - no letter - colon recall 10 yrs and remove any that are in there for sooner

## 2016-09-04 ENCOUNTER — Other Ambulatory Visit: Payer: Self-pay

## 2016-09-04 MED ORDER — ALOSETRON HCL 0.5 MG PO TABS: 0.5000 mg | ORAL_TABLET | Freq: Two times a day (BID) | ORAL | 0 refills | Status: DC

## 2016-09-04 NOTE — Addendum Note (Signed)
Addended by: Marlon Pel on: 09/04/2016 10:22 AM   Modules accepted: Orders

## 2016-09-19 ENCOUNTER — Telehealth: Payer: Self-pay | Admitting: Internal Medicine

## 2016-09-19 NOTE — Telephone Encounter (Signed)
That would be very unusual to come from the [place] to hold it but I suggest after sores go away she try it again

## 2016-09-19 NOTE — Telephone Encounter (Signed)
Spoke with pt and she is aware and will try it again once mouth healed.

## 2016-09-19 NOTE — Telephone Encounter (Signed)
Pt got lotronex script filled 09/04/16 and states that after she had been taking it for 2-3 days she started having sores in her mouth and blisters on her tongue. Pt states she stopped taking it yesterday. She also reports she is still having pain at the top part of her stomach. Dr. Carlean Purl notified.

## 2016-10-08 ENCOUNTER — Ambulatory Visit: Payer: Medicare Other | Admitting: Internal Medicine

## 2016-10-30 LAB — HEMOGLOBIN A1C: Hemoglobin A1C: 12

## 2016-11-28 ENCOUNTER — Telehealth: Payer: Self-pay | Admitting: Internal Medicine

## 2016-11-28 ENCOUNTER — Ambulatory Visit: Payer: Medicare Other | Admitting: Internal Medicine

## 2016-11-28 NOTE — Telephone Encounter (Signed)
No charge per Dr. Gessner. 

## 2016-11-30 ENCOUNTER — Ambulatory Visit (INDEPENDENT_AMBULATORY_CARE_PROVIDER_SITE_OTHER): Payer: Medicare Other | Admitting: "Endocrinology

## 2016-11-30 ENCOUNTER — Encounter: Payer: Self-pay | Admitting: "Endocrinology

## 2016-11-30 ENCOUNTER — Other Ambulatory Visit: Payer: Self-pay

## 2016-11-30 VITALS — BP 142/77 | HR 81 | Ht 63.0 in | Wt 165.0 lb

## 2016-11-30 DIAGNOSIS — E782 Mixed hyperlipidemia: Secondary | ICD-10-CM | POA: Diagnosis not present

## 2016-11-30 DIAGNOSIS — E1122 Type 2 diabetes mellitus with diabetic chronic kidney disease: Secondary | ICD-10-CM

## 2016-11-30 DIAGNOSIS — Z794 Long term (current) use of insulin: Secondary | ICD-10-CM | POA: Diagnosis not present

## 2016-11-30 DIAGNOSIS — I10 Essential (primary) hypertension: Secondary | ICD-10-CM | POA: Diagnosis not present

## 2016-11-30 MED ORDER — ONETOUCH VERIO W/DEVICE KIT: 1.0000 | PACK | 0 refills | Status: DC | PRN

## 2016-11-30 MED ORDER — GLUCOSE BLOOD VI STRP: ORAL_STRIP | 3 refills | Status: DC

## 2016-11-30 MED ORDER — INSULIN DETEMIR 100 UNIT/ML FLEXPEN: 80.0000 [IU] | PEN_INJECTOR | Freq: Every day | SUBCUTANEOUS | 2 refills | Status: DC

## 2016-11-30 NOTE — Progress Notes (Signed)
Subjective:    Patient ID: Heather [name], female    DOB: December 15, 1950. Patient is being seen in consultation for management of diabetes requested by  [facility], MD  Past Medical History:  Diagnosis Date  . B12 deficiency   . Bronchitis, not specified as acute or chronic   . Cellulitis and abscess    abdomen and buttocks  . Depression   . Diabetes mellitus   . Diabetic neuropathy ([place])   . Disorders of phosphorus metabolism   . DJD (degenerative joint disease)    right forminal stenosis C4-5  . Edema   . Essential hypertension   . Folliculitis   . Gout   . Headache(784.0)   . Hiatal hernia   . Hypopotassemia   . Insomnia   . Irritable bowel syndrome   . Osteoarthrosis, unspecified whether generalized or localized, unspecified site   . Palpitations   . Pure hypercholesterolemia   . Reflux esophagitis   . Renal failure, unspecified   . Unspecified tinnitus    right   Past Surgical History:  Procedure Laterality Date  . c-spine surgery     03/2007  . CARDIAC CATHETERIZATION    . CHOLECYSTECTOMY    . COLONOSCOPY  04/21/2011; 05/29/11   6/12: [place] - ?AVM at IC valve, inflammatory changes at [facility] valve [facility]); 7/12 - gessner; IC valve erosions, look chronic and probably Crohn's per path  . ESOPHAGOGASTRODUODENOSCOPY  05/29/11   normal  . ESOPHAGOGASTRODUODENOSCOPY N/A 01/21/2016   Procedure: ESOPHAGOGASTRODUODENOSCOPY (EGD);  Surgeon: Wilford Corner, MD;  Location: [facility] ENDOSCOPY;  Service: Endoscopy;  Laterality: N/A;  . JOINT REPLACEMENT    . Right knee arthroscopic surgery    . TOTAL ABDOMINAL HYSTERECTOMY     Social History   Social History  . Marital status: Married    Spouse name: N/A  . Number of children: 2  . Years of education: N/A   Occupational History  . Disabled    Social History Main Topics  . Smoking status: Never Smoker  . Smokeless tobacco: Never Used  . Alcohol use No  . Drug use: No  . Sexual activity: Not Asked    Other Topics Concern  . None   Social History Narrative   2 glasses of tea daily    Outpatient Encounter Prescriptions as of 11/30/2016  Medication Sig  . allopurinol (ZYLOPRIM) 100 MG tablet Take 100 mg by mouth daily.  . citalopram (CELEXA) 40 MG tablet Take 40 mg by mouth daily.   . cyanocobalamin (,VITAMIN B-12,) 1000 MCG/ML injection Inject 1,000 mcg into the muscle every 30 (thirty) days.    . diazepam (VALIUM) 5 MG tablet Take 5 mg by mouth 2 (two) times daily as needed for anxiety.  . furosemide (LASIX) 40 MG tablet Take 1 tablet (40 mg total) by mouth daily.  . Insulin Detemir (LEVEMIR FLEXTOUCH) 100 UNIT/ML Pen Inject 80 Units into the skin at bedtime.  . Insulin Lispro (HUMALOG KWIKPEN [place]) Inject 15-21 Units into the skin 3 (three) times daily with meals.  . Liraglutide (VICTOZA) 18 MG/3ML SOPN Inject 1.2 mg into the skin daily.  . metoprolol (LOPRESSOR) 100 MG tablet Take 100 mg by mouth 2 (two) times daily.   . potassium chloride SA (K-DUR,KLOR-CON) 20 MEQ tablet Take 20 mEq by mouth daily.  . [DISCONTINUED] Insulin Detemir (LEVEMIR FLEXTOUCH [place]) Inject 80 Units into the skin at bedtime.  . [DISCONTINUED] insulin detemir (LEVEMIR) 100 UNIT/ML injection Inject 0.6 mLs (60 Units total) into  the skin at bedtime. (Patient taking differently: Inject 90 Units into the skin at bedtime. )  . [DISCONTINUED] insulin lispro (HUMALOG KWIKPEN) 100 UNIT/ML SOPN Inject 10-50 Units into the skin 3 (three) times daily with meals. <70= no coverage 70-150= 10 units 151-200=15units 201-250=20units 251-300=25units 301-350=30units 351-400=35units 401-450=40units >450= 50 units  . alosetron (LOTRONEX) 0.5 MG tablet Take 1 tablet (0.5 mg total) by mouth 2 (two) times daily.  . diphenoxylate-atropine (LOMOTIL) 2.5-0.025 MG tablet Take 2 tablets by mouth 4 (four) times daily as needed for diarrhea or loose stools.  Marland Kitchen esomeprazole (NEXIUM) 40 MG capsule Take 40 mg by mouth daily before breakfast.     . [DISCONTINUED] Blood Glucose Monitoring Suppl (ONETOUCH VERIO) w/Device KIT 1 each by Does not apply route as needed.  . [DISCONTINUED] Blood Glucose Monitoring Suppl (ONETOUCH VERIO) w/Device KIT 1 each by Does not apply route as needed.  . [DISCONTINUED] glucose blood (ONE TOUCH TEST STRIPS) test strip Use as instructed  . [DISCONTINUED] glucose blood (ONE TOUCH TEST STRIPS) test strip Use as instructed  . [DISCONTINUED] Hyoscyamine Sulfate SL (LEVSIN/SL) 0.125 MG SUBL Place 0.125 mg under the tongue every 4 (four) hours as needed (abd pain). (Patient not taking: Reported on 08/29/2016)  . [DISCONTINUED] metoCLOPramide (REGLAN) 5 MG tablet Take 1 tablet (5 mg total) by mouth 2 (two) times daily before a meal.  . [DISCONTINUED] ondansetron (ZOFRAN) 4 MG tablet Take 1 tablet (4 mg total) by mouth every 6 (six) hours. (Patient not taking: Reported on 08/29/2016)  . [DISCONTINUED] pantoprazole (PROTONIX) 20 MG tablet Take 1 tablet (20 mg total) by mouth 2 (two) times daily. (Patient not taking: Reported on 08/29/2016)  . [DISCONTINUED] sucralfate (CARAFATE) 1 GM/10ML suspension Take 10 mLs (1 g total) by mouth 4 (four) times daily -  with meals and at bedtime.  . [DISCONTINUED] tiZANidine (ZANAFLEX) 4 MG tablet Take 8 mg by mouth at bedtime.    Facility-Administered Encounter Medications as of 11/30/2016  Medication  . 0.9 %  sodium chloride infusion   ALLERGIES: Allergies  Allergen Reactions  . Duloxetine Hcl Swelling  . Fluocinolone Swelling  . Amlodipine Besylate Swelling    Swelling of feet, fluid retention  . Ciprofloxacin Nausea And Vomiting and Swelling    Swelling of face, jaw, and lips  . Hydrocodone-Acetaminophen Itching  . Metformin Other (See Comments)    REACTION: GI UPSET  . Nsaids Other (See Comments)    "HAS BLEEDING ULCERS"  . Quinolones Swelling  . Sulfamethoxazole Swelling    Swelling of feet, legs  . Sulfonamide Derivatives Swelling  . Valsartan Swelling  . Cozaar  [Losartan] Swelling and Rash  . Lisinopril Swelling and Rash    Oral swelling, and red streaks on arms/stomach   VACCINATION STATUS:  There is no immunization history on file for this patient.  Diabetes  She presents for her initial diabetic visit. She has type 2 diabetes mellitus. Onset time: she was diagnosed at approx age of 49 yrs. Her disease course has been worsening (her recent a1c was 12.8%.). There are no hypoglycemic associated symptoms. Pertinent negatives for hypoglycemia include no confusion, headaches, pallor or seizures. Associated symptoms include blurred vision, fatigue, polydipsia and polyuria. Pertinent negatives for diabetes include no chest pain and no polyphagia. There are no hypoglycemic complications. Symptoms are worsening. Diabetic complications include heart disease and nephropathy. Risk factors for coronary artery disease include diabetes mellitus, dyslipidemia, family history, hypertension, obesity and sedentary lifestyle. Current diabetic treatments: she is taking levemir 90 units  qhs, Humalog 26-28 units TIDAC, Victoza 0.65m daily. Compliance with diabetes treatment: she was seen by me 3 yrs ago, disappeared from care. Her weight is decreasing steadily. She is following a generally unhealthy diet. When asked about meal planning, she reported none. She has had a previous visit with a dietitian (she did not keep appointments.). She rarely participates in exercise. Her overall blood glucose range is >200 mg/dl. (She monitors randomly) An ACE inhibitor/angiotensin II receptor blocker is being taken. Eye exam is current.  Hyperlipidemia  This is a chronic problem. The current episode started more than 1 year ago. Pertinent negatives include no chest pain, myalgias or shortness of breath. She is currently on no antihyperlipidemic treatment (reported statin intolerance.). Risk factors for coronary artery disease include dyslipidemia, diabetes mellitus, hypertension, obesity and a  sedentary lifestyle.  Hypertension  This is a chronic problem. The current episode started more than 1 year ago. The problem is controlled. Associated symptoms include blurred vision. Pertinent negatives include no chest pain, headaches, palpitations or shortness of breath. Risk factors for coronary artery disease include diabetes mellitus, dyslipidemia and sedentary lifestyle. Past treatments include beta blockers (allergic to ARB). Hypertensive end-organ damage includes kidney disease and CAD/MI.       Review of Systems  Constitutional: Positive for fatigue. Negative for chills, fever and unexpected weight change.  HENT: Negative for trouble swallowing and voice change.   Eyes: Positive for blurred vision. Negative for visual disturbance.  Respiratory: Negative for cough, shortness of breath and wheezing.   Cardiovascular: Negative for chest pain, palpitations and leg swelling.  Gastrointestinal: Negative for diarrhea, nausea and vomiting.  Endocrine: Positive for polydipsia and polyuria. Negative for cold intolerance, heat intolerance and polyphagia.  Genitourinary: Positive for frequency.  Musculoskeletal: Negative for arthralgias and myalgias.  Skin: Negative for color change, pallor, rash and wound.  Neurological: Negative for seizures and headaches.  Psychiatric/Behavioral: Negative for confusion and suicidal ideas.    Objective:    BP (!) 142/77   Pulse 81   Ht '5\' 3"'  (1.6 m)   Wt 165 lb (74.8 kg)   BMI 29.23 kg/m   Wt Readings from Last 3 Encounters:  11/30/16 165 lb (74.8 kg)  08/29/16 167 lb (75.8 kg)  08/21/16 167 lb 9.6 oz (76 kg)    Physical Exam  Constitutional: She is oriented to person, place, and time. She appears well-developed.  HENT:  Head: Normocephalic and atraumatic.  Eyes: EOM are normal.  Neck: Normal range of motion. Neck supple. No tracheal deviation present. No thyromegaly present.  Cardiovascular: Normal rate and regular rhythm.    Pulmonary/Chest: Effort normal and breath sounds normal.  Abdominal: Soft. Bowel sounds are normal. There is no tenderness. There is no guarding.  Musculoskeletal: Normal range of motion. She exhibits no edema.  Neurological: She is alert and oriented to person, place, and time. She has normal reflexes. No cranial nerve deficit. Coordination normal.  Skin: Skin is warm and dry. No rash noted. No erythema. No pallor.  Psychiatric: Judgment normal.  She has reluctant affect.    CMP     Component Value Date/Time   NA 133 (L) 08/29/2016 1937   K 3.3 (L) 08/29/2016 1937   CL 98 (L) 08/29/2016 1937   CO2 19 (L) 08/29/2016 1937   GLUCOSE 291 (H) 08/29/2016 1937   BUN 23 (H) 08/29/2016 1937   CREATININE 1.76 (H) 08/29/2016 1937   CALCIUM 9.7 08/29/2016 1937   PROT 7.6 08/15/2016 2232   ALBUMIN 4.2  08/15/2016 2232   AST 33 08/15/2016 2232   ALT 52 08/15/2016 2232   ALKPHOS 103 08/15/2016 2232   BILITOT 0.6 08/15/2016 2232   GFRNONAA 29 (L) 08/29/2016 1937   GFRAA 34 (L) 08/29/2016 1937    Diabetic Labs (most recent): Lab Results  Component Value Date   HGBA1C 12 10/30/2016   HGBA1C 9.0 (H) 12/22/2014    Assessment & Plan:   1. Type 2 diabetes mellitus with chronic kidney disease, with long-term current use of insulin, unspecified CKD stage ([place])  - Patient has currently uncontrolled symptomatic type 2 DM since 66 years of age,  with most recent A1c of 12 %. Recent labs reviewed.   Her diabetes is complicated by CAD, CKD and patient remains at a high risk for more acute and chronic complications of diabetes which include CAD, CVA, CKD, retinopathy, and neuropathy. These are all discussed in detail with the patient.  - I have counseled the patient on diet management and weight loss, by adopting a carbohydrate restricted/protein rich diet.  - Suggestion is made for patient to avoid simple carbohydrates   from their diet including Cakes , Desserts, Ice Cream,  Soda (  diet and  regular) , Sweet Tea , Candies,  Chips, Cookies, Artificial Sweeteners,   and "Sugar-free" Products . This will help patient to have stable blood glucose profile and potentially avoid unintended weight gain.  - I encouraged the patient to switch to  unprocessed or minimally processed complex starch and increased protein intake (animal or plant source), fruits, and vegetables.  - Patient is advised to stick to a routine mealtimes to eat 3 meals  a day and avoid unnecessary snacks ( to snack only to correct hypoglycemia).  - The patient will be scheduled with Jearld Fenton, RDN, CDE for individualized DM education.  - I have approached patient with the following individualized plan to manage diabetes and patient agrees:   - I  will proceed to readjust her basal/bolus insulin: Lower levemir to 80 units daily at bed time, lower her  prandial insulin Humalog to 15  units TIDAC for pre-meal BG readings of 90-155m/dl, plus patient specific correction dose for unexpected hyperglycemia above 1556mdl, associated with strict monitoring of glucose  AC and HS. - Patient is warned not to take insulin without proper monitoring per orders. -Adjustment parameters are given for hypo and hyperglycemia in writing. -Patient is encouraged to call clinic for blood glucose levels less than 70 or above 300 mg /dl. - I will continue increase Victoza to 1.2 mg [place] daily, therapeutically suitable for patient..  -Patient is not a candidate for MTF, SGLT2i due to CKD.  - Patient specific target  A1c;  LDL, HDL, Triglycerides, and  Waist Circumference were discussed in detail.  2) BP/HTN: Controlled. Continue current medications including metoprolol. She does not tolerate ARB.. Marland Kitchen) Lipids/HPL:   Control unknown , will obtain FLP on subsequent labs. She reports statin intolerance.  4)  Weight/Diet: seh quits her f/u with CDE- hesitant to go back , exercise, and detailed carbohydrates information provided.  5) Chronic  Care/Health Maintenance:  -Patient is  encouraged to continue to follow up with Ophthalmology, Nephrology,  Podiatrist at least yearly or according to recommendations, and advised to  stay away from smoking. I have recommended yearly flu vaccine and pneumonia vaccination at least every 5 years; moderate intensity exercise for up to 150 minutes weekly; and  sleep for at least 7 hours a day.  - 60 minutes of  time was spent on the care of this patient , 50% of which was applied for counseling on diabetes complications and their preventions.  - Patient to bring meter and  blood glucose logs during her next visit.   - I advised patient to maintain close follow up with [facility], MD for primary care needs.  Follow up plan: - Return in about 1 week (around 12/07/2016) for follow up with meter and logs- no labs.  Glade Lloyd, MD Phone: [phone]  Fax: [phone]   11/30/2016, 11:30 AM

## 2016-11-30 NOTE — Patient Instructions (Signed)

## 2016-12-06 ENCOUNTER — Other Ambulatory Visit: Payer: Self-pay | Admitting: Internal Medicine

## 2016-12-06 NOTE — Telephone Encounter (Signed)
Refill x 2 ok

## 2016-12-06 NOTE — Telephone Encounter (Signed)
May I refill [name], thank you. 

## 2016-12-11 ENCOUNTER — Ambulatory Visit: Payer: Medicare Other | Admitting: "Endocrinology

## 2016-12-25 ENCOUNTER — Encounter: Payer: Self-pay | Admitting: "Endocrinology

## 2016-12-25 ENCOUNTER — Ambulatory Visit (INDEPENDENT_AMBULATORY_CARE_PROVIDER_SITE_OTHER): Payer: Medicare Other | Admitting: "Endocrinology

## 2016-12-25 ENCOUNTER — Emergency Department (HOSPITAL_COMMUNITY): Payer: Medicare Other

## 2016-12-25 ENCOUNTER — Observation Stay (HOSPITAL_COMMUNITY)
Admission: EM | Admit: 2016-12-25 | Discharge: 2016-12-26 | Disposition: A | Discharge status: Home / Self Care | Payer: Medicare Other | Attending: Internal Medicine | Admitting: Internal Medicine

## 2016-12-25 ENCOUNTER — Encounter (HOSPITAL_COMMUNITY): Payer: Self-pay | Admitting: *Deleted

## 2016-12-25 VITALS — BP 153/89 | HR 94 | Ht 63.0 in | Wt 166.0 lb

## 2016-12-25 DIAGNOSIS — Z23 Encounter for immunization: Secondary | ICD-10-CM | POA: Insufficient documentation

## 2016-12-25 DIAGNOSIS — N183 Chronic kidney disease, stage 3 (moderate): Secondary | ICD-10-CM | POA: Diagnosis not present

## 2016-12-25 DIAGNOSIS — I10 Essential (primary) hypertension: Secondary | ICD-10-CM | POA: Diagnosis not present

## 2016-12-25 DIAGNOSIS — E1165 Type 2 diabetes mellitus with hyperglycemia: Secondary | ICD-10-CM | POA: Diagnosis present

## 2016-12-25 DIAGNOSIS — R110 Nausea: Secondary | ICD-10-CM | POA: Insufficient documentation

## 2016-12-25 DIAGNOSIS — R0602 Shortness of breath: Secondary | ICD-10-CM | POA: Diagnosis not present

## 2016-12-25 DIAGNOSIS — Z79899 Other long term (current) drug therapy: Secondary | ICD-10-CM | POA: Insufficient documentation

## 2016-12-25 DIAGNOSIS — Z794 Long term (current) use of insulin: Secondary | ICD-10-CM | POA: Diagnosis present

## 2016-12-25 DIAGNOSIS — E782 Mixed hyperlipidemia: Secondary | ICD-10-CM | POA: Diagnosis not present

## 2016-12-25 DIAGNOSIS — R079 Chest pain, unspecified: Principal | ICD-10-CM | POA: Diagnosis present

## 2016-12-25 DIAGNOSIS — I129 Hypertensive chronic kidney disease with stage 1 through stage 4 chronic kidney disease, or unspecified chronic kidney disease: Secondary | ICD-10-CM | POA: Insufficient documentation

## 2016-12-25 DIAGNOSIS — E1122 Type 2 diabetes mellitus with diabetic chronic kidney disease: Secondary | ICD-10-CM | POA: Insufficient documentation

## 2016-12-25 DIAGNOSIS — E1121 Type 2 diabetes mellitus with diabetic nephropathy: Secondary | ICD-10-CM | POA: Diagnosis present

## 2016-12-25 HISTORY — DX: Tinnitus, unspecified ear: H93.19

## 2016-12-25 HISTORY — DX: Mixed hyperlipidemia: E78.2

## 2016-12-25 HISTORY — DX: Unspecified osteoarthritis, unspecified site: M19.90

## 2016-12-25 HISTORY — DX: Chronic kidney disease, stage 3 (moderate): N18.3

## 2016-12-25 HISTORY — DX: Chronic kidney disease, stage 3 unspecified: N18.30

## 2016-12-25 HISTORY — DX: Other specified postprocedural states: Z98.890

## 2016-12-25 HISTORY — DX: Personal history of other diseases of the respiratory system: Z87.09

## 2016-12-25 HISTORY — DX: Type 2 diabetes mellitus without complications: E11.9

## 2016-12-25 LAB — LIPID PANEL
CHOLESTEROL: 436 mg/dL — AB (ref 0–200)
LDL Cholesterol: UNDETERMINED mg/dL (ref 0–99)
TRIGLYCERIDES: 1488 mg/dL — AB (ref <150)
VLDL: UNDETERMINED mg/dL (ref 0–40)

## 2016-12-25 LAB — GLUCOSE, CAPILLARY: Glucose-Capillary: 241 mg/dL — ABNORMAL HIGH (ref 65–99)

## 2016-12-25 LAB — TROPONIN I: Troponin I: <0.03 ng/mL (ref <0.03)

## 2016-12-25 LAB — BASIC METABOLIC PANEL
Anion gap: 14 (ref 5–15)
BUN: 27 mg/dL — ABNORMAL HIGH (ref 6–20)
CALCIUM: 10.1 mg/dL (ref 8.9–10.3)
CO2: 25 mmol/L (ref 22–32)
CREATININE: 1.67 mg/dL — AB (ref 0.44–1.00)
Chloride: 98 mmol/L — ABNORMAL LOW (ref 101–111)
GFR calc Af Amer: 36 mL/min — ABNORMAL LOW (ref >60)
GFR, EST NON AFRICAN AMERICAN: 31 mL/min — AB (ref >60)
GLUCOSE: 238 mg/dL — AB (ref 65–99)
Potassium: 3.6 mmol/L (ref 3.5–5.1)
Sodium: 137 mmol/L (ref 135–145)

## 2016-12-25 LAB — CBC
HCT: 37.8 % (ref 36.0–46.0)
Hemoglobin: 12.8 g/dL (ref 12.0–15.0)
MCH: 29 pg (ref 26.0–34.0)
MCHC: 33.9 g/dL (ref 30.0–36.0)
MCV: 85.5 fL (ref 78.0–100.0)
PLATELETS: 229 10*3/uL (ref 150–400)
RBC: 4.42 MIL/uL (ref 3.87–5.11)
RDW: 14.9 % (ref 11.5–15.5)
WBC: 8.9 10*3/uL (ref 4.0–10.5)

## 2016-12-25 LAB — TSH: TSH: 1.489 u[IU]/mL (ref 0.350–4.500)

## 2016-12-25 MED ORDER — HYDRALAZINE HCL 20 MG/ML IJ SOLN
10.0000 mg | INTRAMUSCULAR | Status: DC | PRN
  Administered 2016-12-25 – 2016-12-26 (×2): 10 mg via INTRAVENOUS
  Filled 2016-12-25 (×2): qty 1

## 2016-12-25 MED ORDER — ACETAMINOPHEN 325 MG PO TABS: 650.0000 mg | ORAL_TABLET | ORAL | Status: DC | PRN

## 2016-12-25 MED ORDER — ENOXAPARIN SODIUM 40 MG/0.4ML ~~LOC~~ SOLN
40.0000 mg | SUBCUTANEOUS | Status: DC
  Administered 2016-12-25: 40 mg via SUBCUTANEOUS
  Filled 2016-12-25: qty 0.4

## 2016-12-25 MED ORDER — INSULIN ASPART 100 UNIT/ML ~~LOC~~ SOLN
0.0000 [IU] | Freq: Three times a day (TID) | SUBCUTANEOUS | Status: DC
Start: 2016-12-26 — End: 2016-12-26
  Administered 2016-12-26: 11 [IU] via SUBCUTANEOUS
  Administered 2016-12-26: 8 [IU] via SUBCUTANEOUS

## 2016-12-25 MED ORDER — ALLOPURINOL 100 MG PO TABS
100.0000 mg | ORAL_TABLET | Freq: Every day | ORAL | Status: DC
  Administered 2016-12-26: 100 mg via ORAL
  Filled 2016-12-25: qty 1

## 2016-12-25 MED ORDER — PNEUMOCOCCAL VAC POLYVALENT 25 MCG/0.5ML IJ INJ
0.5000 mL | INJECTION | INTRAMUSCULAR | Status: AC
  Administered 2016-12-26: 0.5 mL via INTRAMUSCULAR

## 2016-12-25 MED ORDER — PANTOPRAZOLE SODIUM 40 MG PO TBEC
80.0000 mg | DELAYED_RELEASE_TABLET | Freq: Every day | ORAL | Status: DC
  Administered 2016-12-26: 80 mg via ORAL
  Filled 2016-12-25: qty 2

## 2016-12-25 MED ORDER — ASPIRIN EC 325 MG PO TBEC
325.0000 mg | DELAYED_RELEASE_TABLET | Freq: Every day | ORAL | Status: DC
  Administered 2016-12-25 – 2016-12-26 (×2): 325 mg via ORAL
  Filled 2016-12-25 (×2): qty 1

## 2016-12-25 MED ORDER — HYDRALAZINE HCL 20 MG/ML IJ SOLN
10.0000 mg | Freq: Once | INTRAMUSCULAR | Status: DC
  Filled 2016-12-25: qty 1

## 2016-12-25 MED ORDER — ONDANSETRON 4 MG PO TBDP
4.0000 mg | ORAL_TABLET | Freq: Once | ORAL | Status: AC
  Administered 2016-12-25: 4 mg via ORAL
  Filled 2016-12-25: qty 1

## 2016-12-25 MED ORDER — PROCHLORPERAZINE EDISYLATE 5 MG/ML IJ SOLN
5.0000 mg | Freq: Once | INTRAMUSCULAR | Status: AC
  Administered 2016-12-25: 5 mg via INTRAVENOUS
  Filled 2016-12-25: qty 2

## 2016-12-25 MED ORDER — ONDANSETRON HCL 4 MG/2ML IJ SOLN
4.0000 mg | Freq: Four times a day (QID) | INTRAMUSCULAR | Status: DC | PRN
  Administered 2016-12-25: 4 mg via INTRAVENOUS
  Filled 2016-12-25: qty 2

## 2016-12-25 MED ORDER — FENTANYL CITRATE (PF) 100 MCG/2ML IJ SOLN
50.0000 ug | Freq: Once | INTRAMUSCULAR | Status: AC
  Administered 2016-12-25: 50 ug via INTRAVENOUS
  Filled 2016-12-25: qty 2

## 2016-12-25 MED ORDER — NITROGLYCERIN 0.4 MG SL SUBL
SUBLINGUAL_TABLET | SUBLINGUAL | Status: AC
  Administered 2016-12-25: 0.4 mg
  Filled 2016-12-25: qty 3

## 2016-12-25 MED ORDER — INSULIN DETEMIR 100 UNIT/ML ~~LOC~~ SOLN
80.0000 [IU] | Freq: Every day | SUBCUTANEOUS | Status: DC
  Administered 2016-12-25: 80 [IU] via SUBCUTANEOUS
  Filled 2016-12-25 (×2): qty 0.8

## 2016-12-25 MED ORDER — DIAZEPAM 5 MG PO TABS: 5.0000 mg | ORAL_TABLET | Freq: Two times a day (BID) | ORAL | Status: DC | PRN

## 2016-12-25 MED ORDER — DIPHENHYDRAMINE HCL 25 MG PO CAPS
25.0000 mg | ORAL_CAPSULE | Freq: Once | ORAL | Status: AC | PRN
  Administered 2016-12-25: 25 mg via ORAL
  Filled 2016-12-25: qty 1

## 2016-12-25 MED ORDER — MORPHINE SULFATE (PF) 2 MG/ML IV SOLN
2.0000 mg | INTRAVENOUS | Status: DC | PRN
  Administered 2016-12-25: 2 mg via INTRAVENOUS
  Filled 2016-12-25 (×3): qty 1

## 2016-12-25 MED ORDER — INFLUENZA VAC SPLIT QUAD 0.5 ML IM SUSY
0.5000 mL | PREFILLED_SYRINGE | INTRAMUSCULAR | Status: AC
  Administered 2016-12-26: 0.5 mL via INTRAMUSCULAR

## 2016-12-25 MED ORDER — ASPIRIN 81 MG PO CHEW
324.0000 mg | CHEWABLE_TABLET | Freq: Once | ORAL | Status: AC
  Administered 2016-12-25: 324 mg via ORAL
  Filled 2016-12-25: qty 4

## 2016-12-25 MED ORDER — FUROSEMIDE 40 MG PO TABS
40.0000 mg | ORAL_TABLET | Freq: Every day | ORAL | Status: DC
  Administered 2016-12-26: 40 mg via ORAL
  Filled 2016-12-25: qty 1

## 2016-12-25 MED ORDER — POTASSIUM CHLORIDE CRYS ER 20 MEQ PO TBCR
20.0000 meq | EXTENDED_RELEASE_TABLET | Freq: Every day | ORAL | Status: DC
Start: 2016-12-26 — End: 2016-12-26
  Administered 2016-12-26: 20 meq via ORAL
  Filled 2016-12-25: qty 1

## 2016-12-25 MED ORDER — OXYCODONE HCL 5 MG PO TABS
5.0000 mg | ORAL_TABLET | Freq: Once | ORAL | Status: AC
  Administered 2016-12-25: 5 mg via ORAL
  Filled 2016-12-25: qty 1

## 2016-12-25 MED ORDER — NITROGLYCERIN 0.4 MG SL SUBL
0.4000 mg | SUBLINGUAL_TABLET | SUBLINGUAL | Status: DC | PRN
  Administered 2016-12-25 (×2): 0.4 mg via SUBLINGUAL

## 2016-12-25 MED ORDER — GI COCKTAIL ~~LOC~~: 30.0000 mL | Freq: Four times a day (QID) | ORAL | Status: DC | PRN

## 2016-12-25 MED ORDER — CITALOPRAM HYDROBROMIDE 20 MG PO TABS
40.0000 mg | ORAL_TABLET | Freq: Every day | ORAL | Status: DC
  Administered 2016-12-26: 40 mg via ORAL
  Filled 2016-12-25: qty 2

## 2016-12-25 MED ORDER — METOPROLOL TARTRATE 50 MG PO TABS
100.0000 mg | ORAL_TABLET | Freq: Two times a day (BID) | ORAL | Status: DC
  Administered 2016-12-25 – 2016-12-26 (×2): 100 mg via ORAL
  Filled 2016-12-25 (×2): qty 2

## 2016-12-25 MED ORDER — METOPROLOL TARTRATE 50 MG PO TABS
50.0000 mg | ORAL_TABLET | Freq: Once | ORAL | Status: AC
  Administered 2016-12-25: 50 mg via ORAL
  Filled 2016-12-25: qty 1

## 2016-12-25 NOTE — ED Triage Notes (Signed)
Pt was sent here by diabetic doctor for chest pain. Pt has had left chest pain that moves into left arm starting yesterday. Pt has had nausea with this.   BP elevated in triage. 187/85

## 2016-12-25 NOTE — ED Provider Notes (Signed)
Emergency Department Provider Note   I have reviewed the triage vital signs and the nursing notes.   HISTORY  Chief Complaint Chest Pain   HPI Heather [name] is a 66 y.o. female with PMH of DM, HTN, GERD, and HLD resents to the emergency department for evaluation of left-sided chest pain, neck pain, left arm pain, and nausea. Symptoms began this morning at 2 AM and have persisted throughout the day. She notes some associated difficulty breathing. No history of blood clots in the legs or lungs. No associated fever, chills, productive cough. She describes the pain as sharp but also a pressure-like sensation. The patient reports pain similar to this 1-2 years prior. She denies any known history of CAD. She also has history of GERD and PUD but reports that this feels very different. Pain is non-radiating with no modifying factors.    Past Medical History:  Diagnosis Date  . B12 deficiency   . Bronchitis, not specified as acute or chronic   . Cellulitis and abscess    abdomen and buttocks  . Depression   . Diabetes mellitus   . Diabetic neuropathy ([place])   . Disorders of phosphorus metabolism   . DJD (degenerative joint disease)    right forminal stenosis C4-5  . Edema   . Essential hypertension   . Folliculitis   . Gout   . Headache(784.0)   . Hiatal hernia   . Hypopotassemia   . Insomnia   . Irritable bowel syndrome   . Osteoarthrosis, unspecified whether generalized or localized, unspecified site   . Palpitations   . Pure hypercholesterolemia   . Reflux esophagitis   . Renal failure, unspecified   . Unspecified tinnitus    right    Patient Active Problem List   Diagnosis Date Noted  . Acute renal failure (ARF) ([place]) 01/25/2016  . Hematemesis 01/21/2016  . Melena 01/21/2016  . Upper GI bleed 01/21/2016  . Acute renal failure superimposed on stage 3 chronic kidney disease ([place]) 01/21/2016  . Coffee ground emesis 01/20/2016  . Faintness 04/29/2015  .  Awareness alteration, transient 03/02/2015  . Neuropathy ([place]) 03/02/2015  . Pain in the chest   . Chest pain 12/22/2014  . Essential hypertension 12/22/2014  . Nausea & vomiting 12/22/2014  . Diabetes mellitus with chronic kidney disease ([place]) 12/22/2014  . Hiatal hernia 12/22/2014  . GERD (gastroesophageal reflux disease) 12/22/2014  . Gout 12/22/2014  . Mixed hyperlipidemia 12/22/2014  . Depression 12/22/2014  . Chronic kidney disease, stage III (moderate) 08/25/2014  . Vitamin B12 deficiency 07/08/2011  . Iron deficiency anemia 05/10/2011  . Personal history of failed moderate sedation 04/16/2011  . Chronic Nausea and Vomiting - ? gastroapresis 04/16/2011  . Early satiety 04/16/2011  . ? Crohn's disease 04/16/2011    Class: Question of  . Irritable bowel syndrome 04/16/2011  . Benign paroxysmal positional vertigo 11/20/2010  . TRICUSPID REGURGITATION, MODERATE 11/20/2010  . PULMONARY HYPERTENSION, MILD 11/20/2010  . LEG EDEMA, BILATERAL 11/20/2010  . DYSPNEA 11/20/2010  . NONSPECIFIC ABNORMAL ELECTROCARDIOGRAM 11/20/2010  . CHEST WALL PAIN, HX OF 11/20/2010    Past Surgical History:  Procedure Laterality Date  . c-spine surgery     03/2007  . CARDIAC CATHETERIZATION    . CHOLECYSTECTOMY    . COLONOSCOPY  04/21/2011; 05/29/11   6/12: [facility] - ?AVM at IC valve, inflammatory changes at [facility] valve [facility]); 7/12 - g[facility]ner; IC valve erosions, look chronic and probably Crohn's per path  . ESOPHAGOGASTRODUODENOSCOPY  05/29/11   normal  . ESOPHAGOGASTRODUODENOSCOPY N/A 01/21/2016   Procedure: ESOPHAGOGASTRODUODENOSCOPY (EGD);  Surgeon: Wilford Corner, MD;  Location: [facility] ENDOSCOPY;  Service: Endoscopy;  Laterality: N/A;  . JOINT REPLACEMENT    . Right knee arthroscopic surgery    . TOTAL ABDOMINAL HYSTERECTOMY      Current Outpatient Rx  . Order #: [id number] Class: Historical Med  . Order #: [id number] Class: Normal  . Order #: [id number] Class: Normal  . Order #:  [id number] Class: Historical Med  . Order #: [id number] Class: Historical Med  . Order #: [id number] Class: Historical Med  . Order #: [id number] Class: Print  . Order #: [id number] Class: Historical Med  . Order #: [id number] Class: Normal  . Order #: [id number] Class: Normal  . Order #: [id number] Class: Normal  . Order #: [id number] Class: Historical Med  . Order #: [id number] Class: Historical Med  . Order #: [id number] Class: Historical Med  . Order #: [id number] Class: Historical Med    Allergies Duloxetine hcl; Fluocinolone; Amlodipine besylate; Ciprofloxacin; Hydrocodone-acetaminophen; Metformin; Nsaids; Quinolones; Sulfamethoxazole; Sulfonamide derivatives; Valsartan; Cozaar [losartan]; and Lisinopril  Family History  Problem Relation Age of Onset  . Diabetes Mother   . Colon cancer Neg Hx     Social History Social History  Substance Use Topics  . Smoking status: Never Smoker  . Smokeless tobacco: Never Used  . Alcohol use No    Review of Systems  Constitutional: No fever/chills Eyes: No visual changes. ENT: No sore throat. Cardiovascular: Positive chest pain. Respiratory: Positive shortness of breath. Gastrointestinal: No abdominal pain. Positive nausea, no vomiting.  No diarrhea.  No constipation. Genitourinary: Negative for dysuria. Musculoskeletal: Negative for back pain. Skin: Negative for rash. Neurological: Negative for headaches, focal weakness or numbness.  10-point ROS otherwise negative.  ____________________________________________   PHYSICAL EXAM:  VITAL SIGNS: ED Triage Vitals [12/25/16 1626]  Enc Vitals Group     BP 187/85     Pulse Rate 94     Resp 18     Temp 98.3 F (36.8 C)     Temp Source Oral     SpO2 100 %     Weight 166 lb (75.3 kg)     Height 5\' 3"  (1.6 m)     Pain Score 10   Constitutional: Alert and oriented. Well appearing and in no acute distress. Eyes: Conjunctivae are normal.  Head: Atraumatic. Nose: No  congestion/rhinnorhea. Mouth/Throat: Mucous membranes are moist.  Oropharynx non-erythematous. Neck: No stridor.  Cardiovascular: Normal rate, regular rhythm. Good peripheral circulation. Grossly normal heart sounds.   Respiratory: Normal respiratory effort.  No retractions. Lungs CTAB. Gastrointestinal: Soft and nontender. No distention.  Musculoskeletal: No lower extremity tenderness nor edema. No gross deformities of extremities. Neurologic:  Normal speech and language. No gross focal neurologic deficits are appreciated.  Skin:  Skin is warm, dry and intact. No rash noted. Psychiatric: Mood and affect are normal. Speech and behavior are normal.   ____________________________________________   LABS (all labs ordered are listed, but only abnormal results are displayed)  Labs Reviewed  BASIC METABOLIC PANEL - Abnormal; Notable for the following:       Result Value   Chloride 98 (*)    Glucose, Bld 238 (*)    BUN 27 (*)    Creatinine, Ser 1.67 (*)    GFR calc non Af Amer 31 (*)    GFR calc Af Amer 36 (*)    All other components within normal limits  CBC  TROPONIN I   ____________________________________________  EKG   EKG Interpretation  Date/Time:  Tuesday December 25 2016 16:18:34 EST Ventricular Rate:  95 PR Interval:  168 QRS Duration: 90 QT Interval:  392 QTC Calculation: 492 R Axis:   83 Text Interpretation:  Normal sinus rhythm Prolonged QT Abnormal ECG No STEMI.  Confirmed by LONG MD, JOSHUA [phone]) on 12/25/2016 7:42:58 PM       ____________________________________________  RADIOLOGY  Dg Chest 2 View  Result Date: 12/25/2016 CLINICAL DATA:  Left-sided chest pain radiating into the left arm beginning yesterday associated with nausea. History of diabetes, hypertension, renal failure, pulmonary hypertension, nonsmoker. EXAM: CHEST  2 VIEW COMPARISON:  Report of a chest x-ray dated January 20, 2016 FINDINGS: The lungs are adequately inflated and clear. The heart  and pulmonary vascularity are normal. The mediastinum is normal in width. There is no pleural effusion or pneumothorax. The patient has undergone previous anterior posterior cervical fusion. The bony thorax exhibits no acute abnormality. IMPRESSION: There is no pneumonia, CHF, nor other acute cardiopulmonary abnormality. Electronically Signed   By: David  Martinique M.D.   On: 12/25/2016 16:53    ____________________________________________   PROCEDURES  Procedure(s) performed:   Procedures  None ____________________________________________   INITIAL IMPRESSION / ASSESSMENT AND PLAN / ED COURSE  Pertinent labs & imaging results that were available during my care of the patient were reviewed by me and considered in my medical decision making (see chart for details).  She resents to the emergency department for evaluation of chest pain, dyspnea, nausea, arm discomfort. She has multiple medical comorbidities including diabetes, hypertension, hyperlipidemia. No recent admission or workup for chest pain. Her last admission was February 2016 for chest pain. Low suspicion for GI source. In addition to chest pain the patient also has associated dyspnea and significantly elevated blood pressure despite taking her metoprolol shortly prior to ED presentation. EKG with no acute findings.   Discussed patient's case with hospitalist, Dr. Lorin Mercy. Patient and family (if present) updated with plan. Care transferred to hospitalist service.  I reviewed all nursing notes, vitals, pertinent old records, EKGs, labs, imaging (as available).  ____________________________________________  FINAL CLINICAL IMPRESSION(S) / ED DIAGNOSES  Final diagnoses:  Nonspecific chest pain     MEDICATIONS GIVEN DURING THIS VISIT:  Medications  hydrALAZINE (APRESOLINE) injection 10 mg (not administered)  aspirin chewable tablet 324 mg (324 mg Oral Given 12/25/16 1959)  metoprolol (LOPRESSOR) tablet 50 mg (50 mg Oral Given  12/25/16 1958)  ondansetron (ZOFRAN-ODT) disintegrating tablet 4 mg (4 mg Oral Given 12/25/16 2000)     NEW OUTPATIENT MEDICATIONS STARTED DURING THIS VISIT:  None   Note:  This document was prepared using Dragon voice recognition software and may include unintentional dictation errors.  Nanda Quinton, MD Emergency Medicine   Margette Fast, MD 12/25/16 [phone]

## 2016-12-25 NOTE — H&P (Signed)
History and Physical    Heather [name] DKS:[id number] DOB: 24-May-1951 DOA: 12/25/2016  PCP: [name], MD Consultants:  Dorris Fetch - endocrinology; [place] - nephrology; Owens Shark - orthopedics; Branch - cardiology (has not seen him yet) Patient coming from: home - lives with husband; NOK: husband, [phone]  Chief Complaint: chest pain  HPI: Heather [name] is a 66 y.o. female with medical history significant of HLD, DM (poorly controlled, last A1c was 12), HTN presenting with chest pain.  She reports awakening at 0200 with left-sided chest pain with nausea.  Started radiating this AM into her neck, down the left arm to fingers with some numbness in fingers.  Fatigue, intermittent SOB.   Has been vomiting since 'Sunday.  No sick contacts.  Nothing made chest pain better or worse.  It is still present (has not been given NTG). Has h/o 2 leaky heart valves and they are keeping an eye on this.   Has h/o HTN - last BP this high (>200) was during last hospitalization; hospitalized in 3/17 for upper GI bleed.    Last Echo was in 7/14 - EF 55-60% with grade 1 diastolic dysfunction.   ED Course: Chest pain, markedly elevated BP.  Given an extra dose of PO metoprolol without improvement.  Given NTG and hydralazine IV.  Review of Systems: As per HPI; otherwise 10 point review of systems reviewed and negative.   Ambulatory Status:  Ambulates without assistance  Past Medical History:  Diagnosis Date  . B12 deficiency   . Bronchitis, not specified as acute or chronic   . Cellulitis and abscess    abdomen and buttocks  . Depression   . Diabetes mellitus   . Diabetic neuropathy (H[facility])   . Disorders of phosphorus metabolism   . DJD (degenerative joint disease)    right forminal stenosis C4-5  . Edema   . Essential hypertension   . Folliculitis   . Gout   . Headache(784.0)   . Hiatal hernia   . Hypopotassemia   . Insomnia   . Irritable bowel syndrome   . Osteoarthrosis, unspecified  whether generalized or localized, unspecified site   . Palpitations   . Pure hypercholesterolemia   . Reflux esophagitis   . Renal failure, unspecified   . Unspecified tinnitus    right    Past Surgical History:  Procedure Laterality Date  . c-spine surgery     06' /2008  . CARDIAC CATHETERIZATION    . CHOLECYSTECTOMY    . COLONOSCOPY  04/21/2011; 05/29/11   6/12: [facility] - ?AVM at IC valve, inflammatory changes at [facility] valve [facility]); 7/12 - [facility]ner; IC valve erosions, look chronic and probably Crohn's per path  . ESOPHAGOGASTRODUODENOSCOPY  05/29/11   normal  . ESOPHAGOGASTRODUODENOSCOPY N/A 01/21/2016   Procedure: ESOPHAGOGASTRODUODENOSCOPY (EGD);  Surgeon: Wilford Corner, MD;  Location: [facility] ENDOSCOPY;  Service: Endoscopy;  Laterality: N/A;  . JOINT REPLACEMENT    . Right knee arthroscopic surgery    . TOTAL ABDOMINAL HYSTERECTOMY      Social History   Social History  . Marital status: Married    Spouse name: N/A  . Number of children: 2  . Years of education: N/A   Occupational History  . Disabled    Social History Main Topics  . Smoking status: Never Smoker  . Smokeless tobacco: Never Used  . Alcohol use No  . Drug use: No  . Sexual activity: Not on file   Other Topics Concern  . Not on file  Social History Narrative   2 glasses of tea daily     Allergies  Allergen Reactions  . Duloxetine Hcl Swelling  . Fluocinolone Swelling  . Amlodipine Besylate Swelling    Swelling of feet, fluid retention  . Ciprofloxacin Nausea And Vomiting and Swelling    Swelling of face, jaw, and lips  . Hydrocodone-Acetaminophen Itching  . Metformin Other (See Comments)    REACTION: GI UPSET  . Nsaids Other (See Comments)    "HAS BLEEDING ULCERS"  . Quinolones Swelling  . Sulfamethoxazole Swelling    Swelling of feet, legs  . Sulfonamide Derivatives Swelling  . Valsartan Swelling  . Cozaar [Losartan] Swelling and Rash  . Lisinopril Swelling and Rash    Oral  swelling, and red streaks on arms/stomach    Family History  Problem Relation Age of Onset  . Diabetes Mother   . Colon cancer Neg Hx     Prior to Admission medications   Medication Sig Start Date End Date Taking? Authorizing Provider  allopurinol (ZYLOPRIM) 100 MG tablet Take 100 mg by mouth daily.    Historical Provider, MD  alosetron (LOTRONEX) 0.5 MG tablet TAKE ONE (1) TABLET BY MOUTH TWICE DAILY 12/07/16   Gatha Mayer, MD  Blood Glucose Monitoring Suppl (ONETOUCH VERIO) w/Device KIT 1 each by Does not apply route as needed. Test 4 times daily 11/30/16   Cassandria Anger, MD  citalopram (CELEXA) 40 MG tablet Take 40 mg by mouth daily.     Historical Provider, MD  cyanocobalamin (,VITAMIN B-12,) 1000 MCG/ML injection Inject 1,000 mcg into the muscle every 30 (thirty) days.      Historical Provider, MD  diazepam (VALIUM) 5 MG tablet Take 5 mg by mouth 2 (two) times daily as needed for anxiety.    Historical Provider, MD  diphenoxylate-atropine (LOMOTIL) 2.5-0.025 MG tablet Take 2 tablets by mouth 4 (four) times daily as needed for diarrhea or loose stools. 08/16/16   Orpah Greek, MD  esomeprazole (NEXIUM) 40 MG capsule Take 40 mg by mouth daily before breakfast.      Historical Provider, MD  furosemide (LASIX) 40 MG tablet Take 1 tablet (40 mg total) by mouth daily. 01/26/16   Reyne Dumas, MD  glucose blood (ONE TOUCH TEST STRIPS) test strip Use as instructed 4 x daily. E11.65 11/30/16   Cassandria Anger, MD  Insulin Detemir (LEVEMIR FLEXTOUCH) 100 UNIT/ML Pen Inject 80 Units into the skin at bedtime. 11/30/16   Cassandria Anger, MD  Insulin Lispro (HUMALOG KWIKPEN [place]) Inject 18-24 Units into the skin 3 (three) times daily with meals.    Historical Provider, MD  Liraglutide (VICTOZA) 18 MG/3ML SOPN Inject 1.2 mg into the skin daily.    Historical Provider, MD  metoprolol (LOPRESSOR) 100 MG tablet Take 100 mg by mouth 2 (two) times daily.     Historical Provider, MD    potassium chloride SA (K-DUR,KLOR-CON) 20 MEQ tablet Take 20 mEq by mouth daily.    Historical Provider, MD    Physical Exam: Vitals:   12/25/16 2030 12/25/16 2109 12/25/16 2115 12/25/16 2119  BP: (!) 218/90 142/75 145/87 142/85  Pulse: 90 98 96 97  Resp: '13 12 13 11  ' Temp:      TempSrc:      SpO2: 98% 98% 96% 97%  Weight:      Height:         General:  Appears calm and comfortable and is NAD Eyes:  PERRL, EOMI, normal lids,  iris ENT:  grossly normal hearing, lips & tongue, mmm Neck:  no LAD, masses or thyromegaly Cardiovascular:  RRR, no m/r/g. No LE edema.  Respiratory:  CTA bilaterally, no w/r/r. Normal respiratory effort. Abdomen:  soft, ntnd, NABS Skin:  no rash or induration seen on limited exam Musculoskeletal:  grossly normal tone BUE/BLE, good ROM, no bony abnormality Psychiatric:  grossly normal mood and affect, speech fluent and appropriate, AOx3 Neurologic:  CN 2-12 grossly intact, moves all extremities in coordinated fashion, sensation intact  Labs on Admission: I have personally reviewed following labs and imaging studies  CBC:  Recent Labs Lab 12/25/16 1838  WBC 8.9  HGB 12.8  HCT 37.8  MCV 85.5  PLT 174   Basic Metabolic Panel:  Recent Labs Lab 12/25/16 1838  NA 137  K 3.6  CL 98*  CO2 25  GLUCOSE 238*  BUN 27*  CREATININE 1.67*  CALCIUM 10.1   GFR: Estimated Creatinine Clearance: 33.1 mL/min (by C-G formula based on SCr of 1.67 mg/dL (H)). Liver Function Tests: No results for input(s): AST, ALT, ALKPHOS, BILITOT, PROT, ALBUMIN in the last 168 hours. No results for input(s): LIPASE, AMYLASE in the last 168 hours. No results for input(s): AMMONIA in the last 168 hours. Coagulation Profile: No results for input(s): INR, PROTIME in the last 168 hours. Cardiac Enzymes:  Recent Labs Lab 12/25/16 1838  TROPONINI <0.03   BNP (last 3 results) No results for input(s): PROBNP in the last 8760 hours. HbA1C: No results for input(s):  HGBA1C in the last 72 hours. CBG: No results for input(s): GLUCAP in the last 168 hours. Lipid Profile: No results for input(s): CHOL, HDL, LDLCALC, TRIG, CHOLHDL, LDLDIRECT in the last 72 hours. Thyroid Function Tests: No results for input(s): TSH, T4TOTAL, FREET4, T3FREE, THYROIDAB in the last 72 hours. Anemia Panel: No results for input(s): VITAMINB12, FOLATE, FERRITIN, TIBC, IRON, RETICCTPCT in the last 72 hours. Urine analysis:    Component Value Date/Time   COLORURINE YELLOW 08/21/2016 1602   APPEARANCEUR CLEAR 08/21/2016 1602   LABSPEC <=1.005 (A) 08/21/2016 1602   PHURINE 5.5 08/21/2016 1602   GLUCOSEU 100 (A) 08/21/2016 1602   HGBUR NEGATIVE 08/21/2016 1602   BILIRUBINUR NEGATIVE 08/21/2016 1602   KETONESUR NEGATIVE 08/21/2016 1602   PROTEINUR NEGATIVE 08/15/2016 2335   UROBILINOGEN 0.2 08/21/2016 1602   NITRITE NEGATIVE 08/21/2016 1602   LEUKOCYTESUR NEGATIVE 08/21/2016 1602    Creatinine Clearance: Estimated Creatinine Clearance: 33.1 mL/min (by C-G formula based on SCr of 1.67 mg/dL (H)).  Sepsis Labs: '@LABRCNTIP' (procalcitonin:4,lacticidven:4) )No results found for this or any previous visit (from the past 240 hour(s)).   Radiological Exams on Admission: Dg Chest 2 View  Result Date: 12/25/2016 CLINICAL DATA:  Left-sided chest pain radiating into the left arm beginning yesterday associated with nausea. History of diabetes, hypertension, renal failure, pulmonary hypertension, nonsmoker. EXAM: CHEST  2 VIEW COMPARISON:  Report of a chest x-ray dated January 20, 2016 FINDINGS: The lungs are adequately inflated and clear. The heart and pulmonary vascularity are normal. The mediastinum is normal in width. There is no pleural effusion or pneumothorax. The patient has undergone previous anterior posterior cervical fusion. The bony thorax exhibits no acute abnormality. IMPRESSION: There is no pneumonia, CHF, nor other acute cardiopulmonary abnormality. Electronically Signed    By: David  Martinique M.D.   On: 12/25/2016 16:53    EKG: Independently reviewed.  NSR with rate 95; prolonged QT (<500) with no evidence of acute ischemia  Assessment/Plan Principal Problem:  Chest pain Active Problems:   Essential hypertension   Diabetes mellitus with chronic kidney disease ([place])   Mixed hyperlipidemia   Chest pain -Patient with left-sided chest pain that came on at rest and has been present for approaching 24 hours. -0/3 typical symptoms suggestive of noncardiac chest pain.  -CXR unremarkable.   -Initial cardiac troponin negative. -EKG not indicative of acute ischemia.   -TIMI risk score is 3; which predicts a 14 days risk of death, recurrent MI, or urgent revascularization of 13.2%.  -Will plan to place in observation status on telemetry to rule out ACS by overnight observation.  -cycle troponin q6h x 3 and repeat EKG in AM -Start ASA -morphine given -Risk factor stratification with HgbA1c not needed - last was 12 in January; will check FLP, TSH, UDS. -Cardiology consultation in AM - NPO after midnight for possible stress test.  HTN -Takes Lopressor monotherapy at home -Patient with very poor control while in the ER -Likely needs the addition of ACE-I therapy but has reported h/o swelling and rash with both Lisinopril and Losartan/Valsartan -Will also add prn hydralazine  HLD -Check lipids -She seems likely to need statin therapy  DM -Glucose 238 -Takes levemir, Lispro, and Victoza at home -Last A1c was 12 -Continue insulins, hold Victoza, and will cover with SSI for now  Stage 3 CKD -Creatinine 1.67, stable    DVT prophylaxis:  Lovenox Code Status: DNR - confirmed with patient Family Communication: None present Disposition Plan:  Home once clinically improved Consults called: Cardiology in AM  Admission status: It is my clinical opinion that referral for OBSERVATION is reasonable and necessary in this patient based on the above information  provided. The aforementioned taken together are felt to place the patient at high risk for further clinical deterioration. However it is anticipated that the patient may be medically stable for discharge from the hospital within 24 to 48 hours.    Karmen Bongo MD [facility]  If 7PM-7AM, please contact night-coverage [email or web address] Password [facility]  12/25/2016, 9:24 PM

## 2016-12-25 NOTE — Patient Instructions (Signed)

## 2016-12-25 NOTE — Progress Notes (Signed)
After receiving Morphine pt continues to complain of 9/10 Chest pain as well as nausea after receiving 4mg  Zofran. Dr. On call paged and made aware. Waiting for orders.

## 2016-12-25 NOTE — Progress Notes (Signed)
Directly to [facility] emergency room due to persistent left-sided chest pain 6 hours associated with nausea/vomiting. Pain radiating down her left arm. She is a  high risk fot coronary artery disease .  Subjective:    Patient ID: Heather [name], female    DOB: 12/31/1950. Patient is being seen in f/u for management of diabetes requested by  [facility], MD  Past Medical History:  Diagnosis Date  . B12 deficiency   . Bronchitis, not specified as acute or chronic   . Cellulitis and abscess    abdomen and buttocks  . Depression   . Diabetes mellitus   . Diabetic neuropathy ([place])   . Disorders of phosphorus metabolism   . DJD (degenerative joint disease)    right forminal stenosis C4-5  . Edema   . Essential hypertension   . Folliculitis   . Gout   . Headache(784.0)   . Hiatal hernia   . Hypopotassemia   . Insomnia   . Irritable bowel syndrome   . Osteoarthrosis, unspecified whether generalized or localized, unspecified site   . Palpitations   . Pure hypercholesterolemia   . Reflux esophagitis   . Renal failure, unspecified   . Unspecified tinnitus    right   Past Surgical History:  Procedure Laterality Date  . c-spine surgery     03/2007  . CARDIAC CATHETERIZATION    . CHOLECYSTECTOMY    . COLONOSCOPY  04/21/2011; 05/29/11   6/12: [place] - ?AVM at IC valve, inflammatory changes at [facility] valve [facility]); 7/12 - [place]; IC valve erosions, look chronic and probably Crohn's per path  . ESOPHAGOGASTRODUODENOSCOPY  05/29/11   normal  . ESOPHAGOGASTRODUODENOSCOPY N/A 01/21/2016   Procedure: ESOPHAGOGASTRODUODENOSCOPY (EGD);  Surgeon: Wilford Corner, MD;  Location: [facility] ENDOSCOPY;  Service: Endoscopy;  Laterality: N/A;  . JOINT REPLACEMENT    . Right knee arthroscopic surgery    . TOTAL ABDOMINAL HYSTERECTOMY     Social History   Social History  . Marital status: Married    Spouse name: N/A  . Number of children: 2  . Years of education: N/A    Occupational History  . Disabled    Social History Main Topics  . Smoking status: Never Smoker  . Smokeless tobacco: Never Used  . Alcohol use No  . Drug use: No  . Sexual activity: Not Asked   Other Topics Concern  . None   Social History Narrative   2 glasses of tea daily    Outpatient Encounter Prescriptions as of 12/25/2016  Medication Sig  . allopurinol (ZYLOPRIM) 100 MG tablet Take 100 mg by mouth daily.  Marland Kitchen alosetron (LOTRONEX) 0.5 MG tablet TAKE ONE (1) TABLET BY MOUTH TWICE DAILY  . Blood Glucose Monitoring Suppl (ONETOUCH VERIO) w/Device KIT 1 each by Does not apply route as needed. Test 4 times daily  . citalopram (CELEXA) 40 MG tablet Take 40 mg by mouth daily.   . cyanocobalamin (,VITAMIN B-12,) 1000 MCG/ML injection Inject 1,000 mcg into the muscle every 30 (thirty) days.    . diazepam (VALIUM) 5 MG tablet Take 5 mg by mouth 2 (two) times daily as needed for anxiety.  . diphenoxylate-atropine (LOMOTIL) 2.5-0.025 MG tablet Take 2 tablets by mouth 4 (four) times daily as needed for diarrhea or loose stools.  Marland Kitchen esomeprazole (NEXIUM) 40 MG capsule Take 40 mg by mouth daily before breakfast.    . furosemide (LASIX) 40 MG tablet Take 1 tablet (40 mg total) by mouth daily.  Marland Kitchen  glucose blood (ONE TOUCH TEST STRIPS) test strip Use as instructed 4 x daily. E11.65  . Insulin Detemir (LEVEMIR FLEXTOUCH) 100 UNIT/ML Pen Inject 80 Units into the skin at bedtime.  . Insulin Lispro (HUMALOG KWIKPEN [place]) Inject 18-24 Units into the skin 3 (three) times daily with meals.  . Liraglutide (VICTOZA) 18 MG/3ML SOPN Inject 1.2 mg into the skin daily.  . metoprolol (LOPRESSOR) 100 MG tablet Take 100 mg by mouth 2 (two) times daily.   . potassium chloride SA (K-DUR,KLOR-CON) 20 MEQ tablet Take 20 mEq by mouth daily.   Facility-Administered Encounter Medications as of 12/25/2016  Medication  . 0.9 %  sodium chloride infusion   ALLERGIES: Allergies  Allergen Reactions  . Duloxetine Hcl  Swelling  . Fluocinolone Swelling  . Amlodipine Besylate Swelling    Swelling of feet, fluid retention  . Ciprofloxacin Nausea And Vomiting and Swelling    Swelling of face, jaw, and lips  . Hydrocodone-Acetaminophen Itching  . Metformin Other (See Comments)    REACTION: GI UPSET  . Nsaids Other (See Comments)    "HAS BLEEDING ULCERS"  . Quinolones Swelling  . Sulfamethoxazole Swelling    Swelling of feet, legs  . Sulfonamide Derivatives Swelling  . Valsartan Swelling  . Cozaar [Losartan] Swelling and Rash  . Lisinopril Swelling and Rash    Oral swelling, and red streaks on arms/stomach   VACCINATION STATUS:  There is no immunization history on file for this patient.  Diabetes  She presents for her follow-up diabetic visit. She has type 2 diabetes mellitus. Onset time: she was diagnosed at approx age of 31 yrs. Her disease course has been worsening (her recent a1c was 12.8%.). There are no hypoglycemic associated symptoms. Pertinent negatives for hypoglycemia include no confusion, headaches, pallor or seizures. Associated symptoms include blurred vision, chest pain, fatigue, polydipsia and polyuria. Pertinent negatives for diabetes include no polyphagia. There are no hypoglycemic complications. Symptoms are worsening. Diabetic complications include heart disease and nephropathy. Risk factors for coronary artery disease include diabetes mellitus, dyslipidemia, family history, hypertension, obesity and sedentary lifestyle. Current diabetic treatments: she is taking levemir 90 units qhs, Humalog 26-28 units TIDAC, Victoza 0.64m daily. Compliance with diabetes treatment: she was seen by me 3 yrs ago, disappeared from care. Her weight is decreasing steadily. She is following a generally unhealthy diet. When asked about meal planning, she reported none. She has had a previous visit with a dietitian (she did not keep appointments.). She rarely participates in exercise. Her overall blood glucose  range is >200 mg/dl. (She monitors randomly) An ACE inhibitor/angiotensin II receptor blocker is being taken. Eye exam is current.  Hyperlipidemia  This is a chronic problem. The current episode started more than 1 year ago. Associated symptoms include chest pain and shortness of breath. Pertinent negatives include no myalgias. She is currently on no antihyperlipidemic treatment (reported statin intolerance.). Risk factors for coronary artery disease include dyslipidemia, diabetes mellitus, hypertension, obesity and a sedentary lifestyle.  Hypertension  This is a chronic problem. The current episode started more than 1 year ago. The problem is controlled. Associated symptoms include blurred vision, chest pain and shortness of breath. Pertinent negatives include no headaches or palpitations. Risk factors for coronary artery disease include diabetes mellitus, dyslipidemia and sedentary lifestyle. Past treatments include beta blockers (allergic to ARB). Hypertensive end-organ damage includes kidney disease and CAD/MI.       Review of Systems  Constitutional: Positive for fatigue. Negative for chills, fever and unexpected weight change.  HENT: Negative for trouble swallowing and voice change.   Eyes: Positive for blurred vision. Negative for visual disturbance.  Respiratory: Positive for chest tightness and shortness of breath. Negative for cough and wheezing.   Cardiovascular: Positive for chest pain. Negative for palpitations and leg swelling.  Gastrointestinal: Negative for diarrhea, nausea and vomiting.  Endocrine: Positive for polydipsia and polyuria. Negative for cold intolerance, heat intolerance and polyphagia.  Genitourinary: Positive for frequency.  Musculoskeletal: Negative for arthralgias and myalgias.  Skin: Negative for color change, pallor, rash and wound.  Neurological: Negative for seizures and headaches.  Psychiatric/Behavioral: Negative for confusion and suicidal ideas.     Objective:    There were no vitals taken for this visit.  Wt Readings from Last 3 Encounters:  11/30/16 165 lb (74.8 kg)  08/29/16 167 lb (75.8 kg)  08/21/16 167 lb 9.6 oz (76 kg)    Physical Exam  Constitutional: She is oriented to person, place, and time. She appears well-developed.  HENT:  Head: Normocephalic and atraumatic.  Eyes: EOM are normal.  Neck: Normal range of motion. Neck supple. No tracheal deviation present. No thyromegaly present.  Cardiovascular: Normal rate and regular rhythm.   Pulmonary/Chest: Effort normal and breath sounds normal.  Abdominal: Soft. Bowel sounds are normal. There is no tenderness. There is no guarding.  Musculoskeletal: Normal range of motion. She exhibits no edema.  Neurological: She is alert and oriented to person, place, and time. She has normal reflexes. No cranial nerve deficit. Coordination normal.  Skin: Skin is warm and dry. No rash noted. No erythema. No pallor.  Psychiatric: Judgment normal.  She has reluctant affect.    CMP     Component Value Date/Time   NA 133 (L) 08/29/2016 1937   K 3.3 (L) 08/29/2016 1937   CL 98 (L) 08/29/2016 1937   CO2 19 (L) 08/29/2016 1937   GLUCOSE 291 (H) 08/29/2016 1937   BUN 23 (H) 08/29/2016 1937   CREATININE 1.76 (H) 08/29/2016 1937   CALCIUM 9.7 08/29/2016 1937   PROT 7.6 08/15/2016 2232   ALBUMIN 4.2 08/15/2016 2232   AST 33 08/15/2016 2232   ALT 52 08/15/2016 2232   ALKPHOS 103 08/15/2016 2232   BILITOT 0.6 08/15/2016 2232   GFRNONAA 29 (L) 08/29/2016 1937   GFRAA 34 (L) 08/29/2016 1937    Diabetic Labs (most recent): Lab Results  Component Value Date   HGBA1C 12 10/30/2016   HGBA1C 9.0 (H) 12/22/2014    Assessment & Plan:   1. Type 2 diabetes mellitus with chronic kidney disease, with long-term current use of insulin, unspecified CKD stage ([place])  - Patient has currently uncontrolled symptomatic type 2 DM since 66 years of age,  with most recent A1c of 12 %. Recent labs  reviewed.   Her diabetes is complicated by Cardiovascular disease, CKD and patient remains at a high risk for more acute and chronic complications of diabetes which include CAD, CVA, CKD, retinopathy, and neuropathy. These are all discussed in detail with the patient.  - I have counseled the patient on diet management and weight loss, by adopting a carbohydrate restricted/protein rich diet.  - Suggestion is made for patient to avoid simple carbohydrates   from their diet including Cakes , Desserts, Ice Cream,  Soda (  diet and regular) , Sweet Tea , Candies,  Chips, Cookies, Artificial Sweeteners,   and "Sugar-free" Products . This will help patient to have stable blood glucose profile and potentially avoid unintended weight gain.  -  I encouraged the patient to switch to  unprocessed or minimally processed complex starch and increased protein intake (animal or plant source), fruits, and vegetables.  - Patient is advised to stick to a routine mealtimes to eat 3 meals  a day and avoid unnecessary snacks ( to snack only to correct hypoglycemia).  - The patient will be scheduled with Jearld Fenton, RDN, CDE for individualized DM education.  - I have approached patient with the following individualized plan to manage diabetes and patient agrees:   - Due to her complaint of chest pain for the last 6 hours which is continuous associated with nausea and vomiting, radiating down her left arm, I agree with her plan to visit ER before she goes home today.  - She has a known history of coronary artery disease. I  will proceed to readjust her basal/bolus insulin: continue levemir 80 units daily at bed time, increase her  prandial insulin Humalog to 18  units TIDAC for pre-meal BG readings of 90-163m/dl, plus patient specific correction dose for unexpected hyperglycemia above 1565mdl, associated with strict monitoring of glucose  AC and HS. - Patient is warned not to take insulin without proper monitoring per  orders. -Adjustment parameters are given for hypo and hyperglycemia in writing. -Patient is encouraged to call clinic for blood glucose levels less than 70 or above 300 mg /dl. - I will continue  Victoza to 1.2 mg  daily, therapeutically suitable for patient..  -Patient is not a candidate for MTF, SGLT2i due to CKD.  - Patient specific target  A1c;  LDL, HDL, Triglycerides, and  Waist Circumference were discussed in detail.  2) BP/HTN: Controlled. Continue current medications including metoprolol. She does not tolerate ARB.. Marland Kitchen) Lipids/HPL:   Control unknown , will obtain FLP on subsequent labs. She reports statin intolerance.  4)  Weight/Diet: seh quits her f/u with CDE- hesitant to go back , exercise, and detailed carbohydrates information provided.  5) Chronic Care/Health Maintenance:  -Patient is  encouraged to continue to follow up with Ophthalmology, Nephrology,  Podiatrist at least yearly or according to recommendations, and advised to  stay away from smoking. I have recommended yearly flu vaccine and pneumonia vaccination at least every 5 years; moderate intensity exercise for up to 150 minutes weekly; and  sleep for at least 7 hours a day.  - 30 minutes of time was spent on the care of this patient , 50% of which was applied for counseling on diabetes complications and their preventions.  - Patient to bring meter and  blood glucose logs during her next visit.   - I advised [facility].  Follow up plan: - Return in about 8 weeks (around 02/19/2017) for follow up with pre-visit labs, meter, and logs.  GeGlade LloydMD Phone: [phone]: [phone] 12/25/2016, 4:08 PM

## 2016-12-26 ENCOUNTER — Observation Stay (HOSPITAL_BASED_OUTPATIENT_CLINIC_OR_DEPARTMENT_OTHER): Payer: Medicare Other

## 2016-12-26 ENCOUNTER — Encounter (HOSPITAL_COMMUNITY): Payer: Self-pay | Admitting: Cardiology

## 2016-12-26 DIAGNOSIS — E1140 Type 2 diabetes mellitus with diabetic neuropathy, unspecified: Secondary | ICD-10-CM

## 2016-12-26 DIAGNOSIS — I10 Essential (primary) hypertension: Secondary | ICD-10-CM

## 2016-12-26 DIAGNOSIS — Z794 Long term (current) use of insulin: Secondary | ICD-10-CM | POA: Diagnosis not present

## 2016-12-26 DIAGNOSIS — R079 Chest pain, unspecified: Secondary | ICD-10-CM | POA: Diagnosis not present

## 2016-12-26 DIAGNOSIS — R0789 Other chest pain: Secondary | ICD-10-CM

## 2016-12-26 DIAGNOSIS — E1165 Type 2 diabetes mellitus with hyperglycemia: Secondary | ICD-10-CM

## 2016-12-26 DIAGNOSIS — E1122 Type 2 diabetes mellitus with diabetic chronic kidney disease: Secondary | ICD-10-CM | POA: Diagnosis not present

## 2016-12-26 DIAGNOSIS — E782 Mixed hyperlipidemia: Secondary | ICD-10-CM | POA: Diagnosis not present

## 2016-12-26 LAB — TROPONIN I: Troponin I: <0.03 ng/mL (ref <0.03)

## 2016-12-26 LAB — ECHOCARDIOGRAM COMPLETE
CHL CUP MV DEC (S): 137
E/e' ratio: 10.09
EWDT: 137 ms
FS: 31 % (ref 28–44)
Height: 63 in
IV/PV OW: 1.05
LA vol: 43.5 mL
LADIAMINDEX: 2.08 cm/m2
LASIZE: 37 mm
LAVOLA4C: 44.1 mL
LAVOLIN: 24.4 mL/m2
LDCA: 3.14 cm2
LEFT ATRIUM END SYS DIAM: 37 mm
LV E/e' medial: 10.09
LV E/e'average: 10.09
LV PW d: 12.6 mm — AB (ref 0.6–1.1)
LV sys vol: 17 mL (ref 14–42)
LVDIAVOL: 68 mL (ref 46–106)
LVDIAVOLIN: 38 mL/m2
LVELAT: 11.6 cm/s
LVOTD: 20 mm
LVSYSVOLIN: 10 mL/m2
Lateral S' vel: 18.5 cm/s
MV pk A vel: 51.9 m/s
MVPG: 5 mmHg
MVPKEVEL: 117 m/s
RV TAPSE: 19.5 mm
Simpson's disk: 74
Stroke v: 51 ml
TDI e' lateral: 11.6
TDI e' medial: 11
Weight: 2636.8 oz

## 2016-12-26 LAB — GLUCOSE, CAPILLARY
GLUCOSE-CAPILLARY: 309 mg/dL — AB (ref 65–99)
Glucose-Capillary: 298 mg/dL — ABNORMAL HIGH (ref 65–99)

## 2016-12-26 LAB — RAPID URINE DRUG SCREEN, HOSP PERFORMED
Amphetamines: NOT DETECTED
BENZODIAZEPINES: POSITIVE — AB
Barbiturates: NOT DETECTED
COCAINE: NOT DETECTED
OPIATES: POSITIVE — AB
Tetrahydrocannabinol: NOT DETECTED

## 2016-12-26 MED ORDER — ASPIRIN EC 81 MG PO TBEC: 81.0000 mg | DELAYED_RELEASE_TABLET | Freq: Every day | ORAL | 1 refills | Status: DC

## 2016-12-26 MED ORDER — OMEGA-3-ACID ETHYL ESTERS 1 G PO CAPS: 1.0000 g | ORAL_CAPSULE | Freq: Two times a day (BID) | ORAL | 0 refills | Status: DC

## 2016-12-26 MED ORDER — ATORVASTATIN CALCIUM 40 MG PO TABS: 80.0000 mg | ORAL_TABLET | Freq: Every day | ORAL | Status: DC

## 2016-12-26 MED ORDER — ATORVASTATIN CALCIUM 80 MG PO TABS: 80.0000 mg | ORAL_TABLET | Freq: Every day | ORAL | 0 refills | Status: DC

## 2016-12-26 MED ORDER — INSULIN DETEMIR 100 UNIT/ML FLEXPEN: 90.0000 [IU] | PEN_INJECTOR | Freq: Every day | SUBCUTANEOUS | 2 refills | Status: DC

## 2016-12-26 MED ORDER — OMEGA-3-ACID ETHYL ESTERS 1 G PO CAPS
1.0000 g | ORAL_CAPSULE | Freq: Two times a day (BID) | ORAL | Status: DC
  Administered 2016-12-26: 1 g via ORAL
  Filled 2016-12-26: qty 1

## 2016-12-26 MED ORDER — TRAMADOL HCL 50 MG PO TABS
25.0000 mg | ORAL_TABLET | Freq: Four times a day (QID) | ORAL | Status: DC | PRN
  Administered 2016-12-26: 25 mg via ORAL
  Filled 2016-12-26: qty 1

## 2016-12-26 NOTE — Consult Note (Signed)
Cardiology Consultation   Patient ID: EMERLYN [name]; [id number]; [birth year]   Admit date: 12/25/2016 Date of Consult: 12/26/2016  Referring MD:  Dr. Waldron Labs Cardiologist: Dr. Bronson Ing  Consulting Cardiologist: Dr. Domenic Polite  Patient Care Team: [name], MD as PCP - General (Internal Medicine)    Reason for Consultation: Chest pain   History of Present Illness: Heather [name] is a 66 y.o. female with a hx of  chest pain with normal cardiac catheterization in 2005 and 2012 with no significant CAD. Borderline elevation of right heart pressures without significant pulmonary hypertension and CT negative for pulmonary embolus. History of hypertension, diabetes, dyslipidemia, admitted now with chest pain and nausea. Troponins negative 3, cholesterol 436, triglycerides 1488, glucose elevated.  Patient was nauseated and vomited all day _0 /2008  . CARDIAC CATHETERIZATION    . CHOLECYSTECTOMY    . COLONOSCOPY  04/21/2011; 05/29/11   6/12: [place] - ?AVM at IC valve, inflammatory changes at [facility] valve [facility]); 7/12 - [facility]; IC valve erosions, look chronic and probably Crohn's per path  . ESOPHAGOGASTRODUODENOSCOPY  05/29/11   normal  . ESOPHAGOGASTRODUODENOSCOPY N/A 01/21/2016   Procedure: ESOPHAGOGASTRODUODENOSCOPY (EGD);  Surgeon: Wilford Corner, MD;  Location: [facility] ENDOSCOPY;  Service: Endoscopy;  Laterality: N/A;  . JOINT REPLACEMENT    . Right knee arthroscopic surgery    . TOTAL ABDOMINAL HYSTERECTOMY       Home Meds: Prior to Admission medications   Medication Sig Start Date End Date Taking? Authorizing Provider  allopurinol (ZYLOPRIM) 100 MG tablet Take 100 mg by mouth daily.   Yes Historical Provider, MD  alosetron (LOTRONEX) 0.5 MG tablet TAKE ONE (1) TABLET BY MOUTH TWICE DAILY 12/07/16  Yes Gatha Mayer, MD  citalopram (CELEXA) 40 MG tablet Take 40 mg by mouth daily.    Yes Historical Provider, MD  cyanocobalamin (,VITAMIN B-12,) 1000 MCG/ML injection Inject 1,000 mcg into the muscle every 30 (thirty) days.     Yes Historical Provider, MD  diazepam (VALIUM) 5 MG tablet Take 5 mg by mouth 2 (two) times daily as needed for anxiety.   Yes Historical Provider, MD  esomeprazole (NEXIUM) 40 MG capsule Take 40 mg  by mouth daily before breakfast.     Yes Historical Provider, MD  furosemide (LASIX) 40 MG tablet Take 1 tablet (40 mg total) by mouth daily. 01/26/16  Yes Reyne Dumas, MD  Insulin Detemir (LEVEMIR FLEXTOUCH) 100 UNIT/ML Pen Inject 80 Units into the skin at bedtime. 11/30/16  Yes Cassandria Anger, MD  Insulin Lispro (HUMALOG KWIKPEN [place]) Inject 18-24 Units into the skin 3 (three) times daily with meals.   Yes Historical Provider, MD  Liraglutide (VICTOZA) 18 MG/3ML SOPN Inject 1.2 mg into the skin daily.   Yes Historical Provider, MD    metoprolol (LOPRESSOR) 100 MG tablet Take 100 mg by mouth 2 (two) times daily.    Yes Historical Provider, MD  potassium chloride SA (K-DUR,KLOR-CON) 20 MEQ tablet Take 20 mEq by mouth daily.   Yes Historical Provider, MD  tiZANidine (ZANAFLEX) 4 MG tablet Take 8 mg by mouth at bedtime.   Yes Historical Provider, MD  Blood Glucose Monitoring Suppl (ONETOUCH VERIO) w/Device KIT 1 each by Does not apply route as needed. Test 4 times daily 11/30/16   Cassandria Anger, MD  diphenoxylate-atropine (LOMOTIL) 2.5-0.025 MG tablet Take 2 tablets by mouth 4 (four) times daily as needed for diarrhea or loose stools. 08/16/16   Orpah Greek, MD  glucose blood (ONE TOUCH TEST STRIPS) test strip Use as instructed 4 x daily. E11.65 11/30/16   Cassandria Anger, MD    Current Medications: . allopurinol  100 mg Oral Daily  . aspirin EC  325 mg Oral Daily  . atorvastatin  80 mg Oral q1800  . citalopram  40 mg Oral Daily  . enoxaparin (LOVENOX) injection  40 mg Subcutaneous Q24H  . furosemide  40 mg Oral Daily  . hydrALAZINE  10 mg Intravenous Once  . Influenza vac split quadrivalent PF  0.5 mL Intramuscular Tomorrow-1000  . insulin aspart  0-15 Units Subcutaneous TID WC  . insulin detemir  80 Units Subcutaneous QHS  . metoprolol  100 mg Oral BID  . pantoprazole  80 mg Oral Q1200  . pneumococcal 23 valent vaccine  0.5 mL Intramuscular Tomorrow-1000  . potassium chloride SA  20 mEq Oral Daily     Allergies:    Allergies  Allergen Reactions  . Duloxetine Hcl Swelling  . Fluocinolone Swelling  . Acetaminophen Itching and Swelling    Pt states "some swelling"  . Amlodipine Besylate Swelling    Swelling of feet, fluid retention  . Ciprofloxacin Nausea And Vomiting and Swelling    Swelling of face, jaw, and lips  . Hydrocodone-Acetaminophen Itching  . Metformin Other (See Comments)    REACTION: GI UPSET  . Nsaids Other (See Comments)    "HAS BLEEDING ULCERS"  . Quinolones Swelling  .  Sulfamethoxazole Swelling    Swelling of feet, legs  . Sulfonamide Derivatives Swelling  . Valsartan Swelling  . Cozaar [Losartan] Swelling and Rash  . Lisinopril Swelling and Rash    Oral swelling, and red streaks on arms/stomach    Social History:   The patient  reports that she has never smoked. She has never used smokeless tobacco. She reports that she does not drink alcohol or use drugs.    Family History:   The patient's family history includes Diabetes in her mother.   ROS:  Please see the history of present illness.  Review of Systems  Constitution: Negative.  HENT: Negative.   Eyes: Negative.   Cardiovascular: Positive for chest pain.  Respiratory: Negative.  Hematologic/Lymphatic: Negative.   Musculoskeletal: Negative.  Negative for joint pain.  Gastrointestinal: Positive for diarrhea, heartburn and nausea.  Genitourinary: Negative.   Neurological: Negative.    All other ROS reviewed and negative.     Vital Signs: Blood pressure (!) 140/48, pulse 94, temperature 98.2 F (36.8 C), temperature source Oral, resp. rate 18, height _0  (1.6 m), weight 164 lb 12.8 oz (74.8 kg), SpO2 98 %.   PHYSICAL EXAM: General:  Well nourished, well developed, in no acute distress  HEENT: normal Lymph: no adenopathy Neck: no JVD Endocrine:  No thryomegaly Vascular: No carotid bruits; FA pulses 2+ bilaterally without bruits  Cardiac:  RRR; normal S1, S2; no murmur, rub, bruit, thrill, or heave Lungs:  clear to auscultation bilaterally, no wheezing, rhonchi or rales  Abd: soft, nontender, no hepatomegaly  Ext: no edema, Good distal pulses bilaterally Musculoskeletal:  No deformities, BUE and BLE strength normal and equal Skin: warm and dry  Neuro:  CNs 2-12 intact, no focal abnormalities noted Psych:  Normal affect    EKG: NSR, no significant ST changes. Personally reviewed.  Telemetry: NSR with occas PVC. Personally reviewed.  Labs:  Recent Labs  12/25/16 1838  12/25/16 2225 12/26/16 0417  TROPONINI <0.03 <0.03 <0.03   Lab Results  Component Value Date   WBC 8.9 12/25/2016   HGB 12.8 12/25/2016   HCT 37.8 12/25/2016   MCV 85.5 12/25/2016   PLT 229 12/25/2016    Recent Labs Lab 12/25/16 1838  NA 137  K 3.6  CL 98*  CO2 25  BUN 27*  CREATININE 1.67*  CALCIUM 10.1  GLUCOSE 238*   Lab Results  Component Value Date   CHOL 436 (H) 12/25/2016   HDL NOT REPORTED DUE TO HIGH TRIGLYCERIDES 12/25/2016   LDLCALC UNABLE TO CALCULATE IF TRIGLYCERIDE OVER 400 mg/dL 12/25/2016   TRIG 1,488 (H) 12/25/2016   Lab Results  Component Value Date   DDIMER  12/13/2010    <0.22        AT THE INHOUSE ESTABLISHED CUTOFF VALUE OF 0.48 ug/mL FEU, THIS ASSAY HAS BEEN DOCUMENTED IN THE LITERATURE TO HAVE A SENSITIVITY AND NEGATIVE PREDICTIVE VALUE OF AT LEAST 98 TO 99%.  THE TEST RESULT SHOULD BE CORRELATED WITH AN ASSESSMENT OF THE CLINICAL PROBABILITY OF DVT / VTE.    Radiology/Studies:  Dg Chest 2 View  Result Date: 12/25/2016 CLINICAL DATA:  Left-sided chest pain radiating into the left arm beginning yesterday associated with nausea. History of diabetes, hypertension, renal failure, pulmonary hypertension, nonsmoker. EXAM: CHEST  2 VIEW COMPARISON:  Report of a chest x-ray dated January 20, 2016 FINDINGS: The lungs are adequately inflated and clear. The heart and pulmonary vascularity are normal. The mediastinum is normal in width. There is no pleural effusion or pneumothorax. The patient has undergone previous anterior posterior cervical fusion. The bony thorax exhibits no acute abnormality. IMPRESSION: There is no pneumonia, CHF, nor other acute cardiopulmonary abnormality. Electronically Signed   By: David  Martinique M.D.   On: 12/25/2016 16:53   Echocardiogram 05/28/2013 Left ventricle: The cavity size was normal. There was mild   concentric hypertrophy. Systolic function was normal. The   estimated ejection fraction was in the range of 55% to    60%. Wall motion was normal; there were no regional wall   motion abnormalities. Doppler parameters are consistent   with abnormal left ventricular relaxation (grade 1   diastolic dysfunction). Borderline evidence of increased   filling pressures. - Aortic valve: Trileaflet;  mildly thickened leaflets. No   significant regurgitation. Mean gradient: 42m Hg (S).   Valve area: 2.38cm^2 (Vmax). - Mitral valve: Mild regurgitation. Valve area by continuity   equation (using LVOT flow): 4.83cm^2. - Left atrium: The atrium was mildly dilated. - Right atrium: Central venous pressure: 357mHg (est). - Tricuspid valve: Mild regurgitation. - Pulmonary arteries: PA peak pressure: 3259mg (S). - Pericardium, extracardiac: There was no pericardial   effusion.  Cardiac catheterization 2012: CONCLUSION: 1. Borderline elevation of right heart pressures without significant     pulmonary hypertension. 2. No evidence of either aortic or mitral valve gradient. 3. Somewhat low saturations in the superior vena cava and pulmonary     artery rechecked during the procedure. 4. No significant coronary obstruction.   DISPOSITION:  I have discussed her case with Dr. McLAundra DubinWe will get a 2-D echo to reassess her situation whether she needs further imaging studies is unclear.  At the present time, she will not need revascularization.  Her right heart pressures are minimally elevated. Her saturations are somewhat low, for unclear reasons.   PROBLEM LIST:  Principal Problem:   Chest pain Active Problems:   Essential hypertension   Diabetes mellitus with chronic kidney disease ([place] Mixed hyperlipidemia    ASSESSMENT AND PLAN:  Chest pain, prolonged episode with negative troponins 3, no acute ST segments changes by ECG, normal coronary arteries on cardiac catheterization 2005 and 2012. Still with some nausea today. Suspect GI origin. Order 2Decho to exclude cardiomyopathy and wall motion abnormalities.  Would hold off on stress test at this time.  Mixed hyperlipidemia, uncontrolled with cholesterol over 400 and triglycerides of 1488: Add lipitor 80 mg daily and omega 3 supplements.  Diabetes mellitus uncontrolled.  Hypertension on lopressor, apresoline and lasix  Renal insufficiency Crt 1.67  Nausea, vomiting, diarrhea ? GI consult if not improved.   Signed, MIcErmalinda BarriosA-C  12/26/2016 9:15 AM    Attending note:  Patient seen and examined. Reviewed records and modified above note. Ms. HolLiraesents with persistent nausea after episodic nausea and vomiting on Sunday, followed more recently by chest discomfort. She has not had any exertional chest pain leading up to this, and with prolonged chest pain her ECG shows no acute ST segment changes, troponin I levels are negative arguing against ACS. History includes recurring chest pain over the years with prior reassuring evaluation including cardiac catheterization most recently in 2012. Poorly controlled type 2 diabetes mellitus and hyperlipidemia.  On examination she appears comfortable this morning, states that she does feel better, mild nausea. Has not yet eaten. Systolic blood pressure 140466Zeart rate 90-100 range in sinus rhythm with occasional PVCs by telemetry. Lungs are clear without labored breathing. Cardiac exam with RRR no gallop or rub. Appointment levels negative 3. Hemoglobin 12.8, creatinine 1.67, chest x-ray without infiltrate.  Episode of prolonged atypical chest pain, and no acute ST segment changes and troponin I levels arguing against ACS. Patient does have a robust cardiac risk factor profile with uncontrolled diabetes mellitus and hyperlipidemia, however has had prior reassuring cardiac evaluation with recurring chest pain over the years. At this point suspect potential GI etiology. Needs to have more aggressive risk factor reduction. Echocardiogram will be obtained to ensure stability in LVEF and no new wall  motion abnormalities. At this point would hold off of stress testing.  SamSatira Sark.D., F.A.C.C.

## 2016-12-26 NOTE — Discharge Summary (Signed)
Heather [name], is a 66 y.o. female  DOB 10-Dec-1950  MRN [id number].  Admission date:  12/25/2016  Admitting Physician  Karmen Bongo, MD  Discharge Date:  12/26/2016   Primary MD  [name], MD  Recommendations for primary care physician for things to follow:  - Patient will need aggressive risk factor modification, and better control for diabetes mellitus, and lipid status, is following with endocrinology regarding her diabetes, and started on Lipitor 80 mg oral daily, and started on aspirin 81 mg oral daily as it is okay by her GI.    Admission Diagnosis  Nonspecific chest pain [R07.9]   Discharge Diagnosis  Nonspecific chest pain [R07.9]    Principal Problem:   Chest pain Active Problems:   Essential hypertension   Diabetes mellitus with chronic kidney disease ([place])   Mixed hyperlipidemia      Past Medical History:  Diagnosis Date  . B12 deficiency   . Cellulitis and abscess    Abdomen and buttocks  . CKD (chronic kidney disease) stage 3, GFR 30-59 ml/min   . Depression   . Diabetic neuropathy ([place])   . DJD (degenerative joint disease)    Right forminal stenosis C4-5  . Essential hypertension   . Folliculitis   . Gout   . Headache(784.0)   . Hiatal hernia   . History of bronchitis   . History of cardiac catheterization    No significant CAD 2012  . Insomnia   . Irritable bowel syndrome   . Mixed hyperlipidemia   . Osteoarthritis   . Palpitations   . Reflux esophagitis   . Tinnitus    Right  . Type 2 diabetes mellitus [facility])     Past Surgical History:  Procedure Laterality Date  . c-spine surgery     03/2007  . CARDIAC CATHETERIZATION    . CHOLECYSTECTOMY    . COLONOSCOPY  04/21/2011; 05/29/11   6/12: [place] - ?AVM at IC valve, inflammatory changes at [facility] valve [facility]); 7/12 - [facility]; IC valve erosions, look chronic and probably Crohn's per path  .  ESOPHAGOGASTRODUODENOSCOPY  05/29/11   normal  . ESOPHAGOGASTRODUODENOSCOPY N/A 01/21/2016   Procedure: ESOPHAGOGASTRODUODENOSCOPY (EGD);  Surgeon: Wilford Corner, MD;  Location: [facility] ENDOSCOPY;  Service: Endoscopy;  Laterality: N/A;  . JOINT REPLACEMENT    . Right knee arthroscopic surgery    . TOTAL ABDOMINAL HYSTERECTOMY         History of present illness and  Hospital Course:     Kindly see H&P for history of present illness and admission details, please review complete Labs, Consult reports and Test reports for all details in brief  HPI  from the history and physical done on the day of admission 12/25/2016  HPI: Heather [name] is a 66 y.o. female with medical history significant of HLD, DM (poorly controlled, last A1c was 12), HTN presenting with chest pain.  She reports awakening at 0200 with left-sided chest pain with nausea.  Started radiating this AM into her neck, down the left  arm to fingers with some numbness in fingers.  Fatigue, intermittent SOB.   Has been vomiting since Sunday.  No sick contacts.  Nothing made chest pain better or worse.  It is still present (has not been given NTG). Has h/o 2 leaky heart valves and they are keeping an eye on this.   Has h/o HTN - last BP this high (>200) was during last hospitalization; hospitalized in 3/17 for upper GI bleed.    Last Echo was in 7/14 - EF 55-60% with grade 1 diastolic dysfunction.   ED Course: Chest pain, markedly elevated BP.  Given an extra dose of PO metoprolol without improvement.  Given NTG and hydralazine IV.  Hospital Course   Chest pain - No recurrence during hospital stay, negative troponins 3, no acute ST changes, normal coronary arteries and cardiac cath 2012, 2-D echo was obtained, showing vigorous LVEF, and no wall motion abnormalities, he wasn't put appreciated, no further ischemic testing is planned at this point, but recommendation for aggressive risk factor modification, started on high intensity  statin Lipitor 80 mg oral , and started on aspirin 81 mg oral daily(as discussed with primary GI)  Diabetes mellitus - Overall poor control with A1c of 12 last month, is managed by her primary endocrinologist, patient tells me her endocrinologist instructed her to increase her Levemir to 90 units and to continue with insulin sliding scale before meals.  Hypertension - Continue with home medication  Mixed hyperlipidemia - uncontrolled with cholesterol over 400 and triglycerides of 1488: Add lipitor 80 mg daily and omega 3 supplements - She was counseled about diet and lifestyle modification  CK D stage III - Creatinine at baseline  History of gastric ulcer - Most recent endoscopy in 08/2016 with healed ulcer normal esophagus and stomach, discussed with Dr. Carlean Purl, it is okay to start patient on aspirin 81 mg daily as long she remains on PPI, she was on Nexium 40 mg oral daily, counseled with presence  her husband to continue with PPI indefinitely.     Discharge Condition:  stable   Follow UP  Follow-up Information    SHAH,ASHISH, MD Follow up in 1 week(s).   Specialty:  Internal Medicine Contact information: [place] [address] [phone]             Discharge Instructions  and  Discharge Medications     Discharge Instructions    Discharge instructions    Complete by:  As directed    Follow with Primary MD [name], MD in 7 days   Get CBC, CMP,  checked  by Primary MD next visit.    Activity: As tolerated with Full fall precautions use walker/cane & assistance as needed   Disposition Home    Diet: Heart Healthy , low-fat, carb modified, with feeding assistance and aspiration precautions.  For Heart failure patients - Check your Weight same time everyday, if you gain over 2 pounds, or you develop in leg swelling, experience more shortness of breath or chest pain, call your Primary MD immediately. Follow Cardiac Low Salt Diet and 1.5 lit/day  fluid restriction.   On your next visit with your primary care physician please Get Medicines reviewed and adjusted.   Please request your Prim.MD to go over all Hospital Tests and Procedure/Radiological results at the follow up, please get all Hospital records sent to your Prim MD by signing hospital release before you go home.   If you experience worsening of your admission symptoms, develop shortness  of breath, life threatening emergency, suicidal or homicidal thoughts you must seek medical attention immediately by calling 911 or calling your MD immediately  if symptoms less severe.  You Must read complete instructions/literature along with all the possible adverse reactions/side effects for all the Medicines you take and that have been prescribed to you. Take any new Medicines after you have completely understood and accpet all the possible adverse reactions/side effects.   Do not drive, operating heavy machinery, perform activities at heights, swimming or participation in water activities or provide baby sitting services if your were admitted for syncope or siezures until you have seen by Primary MD or a Neurologist and advised to do so again.  Do not drive when taking Pain medications.    Do not take more than prescribed Pain, Sleep and Anxiety Medications  Special Instructions: If you have smoked or chewed Tobacco  in the last 2 yrs please stop smoking, stop any regular Alcohol  and or any Recreational drug use.  Wear Seat belts while driving.   Please note  You were cared for by a hospitalist during your hospital stay. If you have any questions about your discharge medications or the care you received while you were in the hospital after you are discharged, you can call the unit and asked to speak with the hospitalist on call if the hospitalist that took care of you is not available. Once you are discharged, your primary care physician will handle any further medical issues. Please  note that NO REFILLS for any discharge medications will be authorized once you are discharged, as it is imperative that you return to your primary care physician (or establish a relationship with a primary care physician if you do not have one) for your aftercare needs so that they can reassess your need for medications and monitor your lab values.   Increase activity slowly    Complete by:  As directed      Allergies as of 12/26/2016      Reactions   Duloxetine Hcl Swelling   Fluocinolone Swelling   Acetaminophen Itching, Swelling   Pt states "some swelling"   Amlodipine Besylate Swelling   Swelling of feet, fluid retention   Ciprofloxacin Nausea And Vomiting, Swelling   Swelling of face, jaw, and lips   Hydrocodone-acetaminophen Itching   Metformin Other (See Comments)   REACTION: GI UPSET   Nsaids Other (See Comments)   "HAS BLEEDING ULCERS"   Quinolones Swelling   Sulfamethoxazole Swelling   Swelling of feet, legs   Sulfonamide Derivatives Swelling   Valsartan Swelling   Cozaar [losartan] Swelling, Rash   Lisinopril Swelling, Rash   Oral swelling, and red streaks on arms/stomach      Medication List    TAKE these medications   allopurinol 100 MG tablet Commonly known as:  ZYLOPRIM Take 100 mg by mouth daily.   alosetron 0.5 MG tablet Commonly known as:  LOTRONEX TAKE ONE (1) TABLET BY MOUTH TWICE DAILY   aspirin EC 81 MG tablet Take 1 tablet (81 mg total) by mouth daily.   atorvastatin 80 MG tablet Commonly known as:  LIPITOR Take 1 tablet (80 mg total) by mouth daily at 6 PM.   citalopram 40 MG tablet Commonly known as:  CELEXA Take 40 mg by mouth daily.   cyanocobalamin 1000 MCG/ML injection Commonly known as:  (VITAMIN B-12) Inject 1,000 mcg into the muscle every 30 (thirty) days.   diazepam 5 MG tablet Commonly known as:  VALIUM  Take 5 mg by mouth 2 (two) times daily as needed for anxiety.   diphenoxylate-atropine 2.5-0.025 MG tablet Commonly known  as:  LOMOTIL Take 2 tablets by mouth 4 (four) times daily as needed for diarrhea or loose stools.   esomeprazole 40 MG capsule Commonly known as:  NEXIUM Take 40 mg by mouth daily before breakfast.   furosemide 40 MG tablet Commonly known as:  LASIX Take 1 tablet (40 mg total) by mouth daily.   glucose blood test strip Commonly known as:  ONE TOUCH TEST STRIPS Use as instructed 4 x daily. E11.65   HUMALOG KWIKPEN [place] Inject 18-24 Units into the skin 3 (three) times daily with meals.   Insulin Detemir 100 UNIT/ML Pen Commonly known as:  LEVEMIR FLEXTOUCH Inject 90 Units into the skin at bedtime. What changed:  how much to take   metoprolol 100 MG tablet Commonly known as:  LOPRESSOR Take 100 mg by mouth 2 (two) times daily.   omega-3 acid ethyl esters 1 g capsule Commonly known as:  LOVAZA Take 1 capsule (1 g total) by mouth 2 (two) times daily.   ONETOUCH VERIO w/Device Kit 1 each by Does not apply route as needed. Test 4 times daily   potassium chloride SA 20 MEQ tablet Commonly known as:  K-DUR,KLOR-CON Take 20 mEq by mouth daily.   tiZANidine 4 MG tablet Commonly known as:  ZANAFLEX Take 8 mg by mouth at bedtime.   VICTOZA 18 MG/3ML Sopn Generic drug:  liraglutide Inject 1.2 mg into the skin daily.         Diet and Activity recommendation: See Discharge Instructions above   Consults obtained -  Cardiology   Major procedures and Radiology Reports - PLEASE review detailed and final reports for all details, in brief -     Dg Chest 2 View  Result Date: 12/25/2016 CLINICAL DATA:  Left-sided chest pain radiating into the left arm beginning yesterday associated with nausea. History of diabetes, hypertension, renal failure, pulmonary hypertension, nonsmoker. EXAM: CHEST  2 VIEW COMPARISON:  Report of a chest x-ray dated January 20, 2016 FINDINGS: The lungs are adequately inflated and clear. The heart and pulmonary vascularity are normal. The mediastinum is  normal in width. There is no pleural effusion or pneumothorax. The patient has undergone previous anterior posterior cervical fusion. The bony thorax exhibits no acute abnormality. IMPRESSION: There is no pneumonia, CHF, nor other acute cardiopulmonary abnormality. Electronically Signed   By: David  Martinique M.D.   On: 12/25/2016 16:53    Micro Results    No results found for this or any previous visit (from the past 240 hour(s)).     Today   Subjective:   Heather [name] today has no headache,no chest or abdominal pain,no new weakness tingling or numbness, feels much better wants to go home today.   Objective:   Blood pressure (!) 140/48, pulse 94, temperature 98.2 F (36.8 C), temperature source Oral, resp. rate 18, height '5\' 3"'  (1.6 m), weight 74.8 kg (164 lb 12.8 oz), SpO2 98 %.   Intake/Output Summary (Last 24 hours) at 12/26/16 1416 Last data filed at 12/25/16 2330  Gross per 24 hour  Intake              200 ml  Output              100 ml  Net              100 ml    Exam Awake  Alert, Oriented x 3, No new F.N deficits, Normal affect [place].AT,PERRAL Supple Neck,No JVD,  Symmetrical Chest wall movement, Good air movement bilaterally, CTAB RRR,No Gallops,Rubs or new Murmurs, No Parasternal Heave +ve B.Sounds, Abd Soft, Non tender, No rebound -guarding or rigidity. No Cyanosis, Clubbing or edema, No new Rash or bruise  Data Review   CBC w Diff:  Lab Results  Component Value Date   WBC 8.9 12/25/2016   HGB 12.8 12/25/2016   HCT 37.8 12/25/2016   PLT 229 12/25/2016   LYMPHOPCT 23 08/29/2016   MONOPCT 5 08/29/2016   EOSPCT 1 08/29/2016   BASOPCT 1 08/29/2016    CMP:  Lab Results  Component Value Date   NA 137 12/25/2016   K 3.6 12/25/2016   CL 98 (L) 12/25/2016   CO2 25 12/25/2016   BUN 27 (H) 12/25/2016   CREATININE 1.67 (H) 12/25/2016   PROT 7.6 08/15/2016   ALBUMIN 4.2 08/15/2016   BILITOT 0.6 08/15/2016   ALKPHOS 103 08/15/2016   AST 33 08/15/2016    ALT 52 08/15/2016  .   Total Time in preparing paper work, data evaluation and todays exam - 35 minutes  Wilbon Obenchain M.D on 12/26/2016 at 2:16 PM  [facility]   Office  [phone]

## 2016-12-26 NOTE — Progress Notes (Signed)
Pts BP 182/77, given Hydralazine IV per PRN order. Will continue to monitor pt

## 2016-12-26 NOTE — Discharge Instructions (Signed)
Follow with Primary MD [name], MD in 7 days   Get CBC, CMP,  checked  by Primary MD next visit.    Activity: As tolerated with Full fall precautions use walker/cane & assistance as needed   Disposition Home    Diet: Heart Healthy , low-fat, carb modified, with feeding assistance and aspiration precautions.  For Heart failure patients - Check your Weight same time everyday, if you gain over 2 pounds, or you develop in leg swelling, experience more shortness of breath or chest pain, call your Primary MD immediately. Follow Cardiac Low Salt Diet and 1.5 lit/day fluid restriction.   On your next visit with your primary care physician please Get Medicines reviewed and adjusted.   Please request your Prim.MD to go over all Hospital Tests and Procedure/Radiological results at the follow up, please get all Hospital records sent to your Prim MD by signing hospital release before you go home.   If you experience worsening of your admission symptoms, develop shortness of breath, life threatening emergency, suicidal or homicidal thoughts you must seek medical attention immediately by calling 911 or calling your MD immediately  if symptoms less severe.  You Must read complete instructions/literature along with all the possible adverse reactions/side effects for all the Medicines you take and that have been prescribed to you. Take any new Medicines after you have completely understood and accpet all the possible adverse reactions/side effects.   Do not drive, operating heavy machinery, perform activities at heights, swimming or participation in water activities or provide baby sitting services if your were admitted for syncope or siezures until you have seen by Primary MD or a Neurologist and advised to do so again.  Do not drive when taking Pain medications.    Do not take more than prescribed Pain, Sleep and Anxiety Medications  Special Instructions: If you have smoked or chewed Tobacco  in  the last 2 yrs please stop smoking, stop any regular Alcohol  and or any Recreational drug use.  Wear Seat belts while driving.   Please note  You were cared for by a hospitalist during your hospital stay. If you have any questions about your discharge medications or the care you received while you were in the hospital after you are discharged, you can call the unit and asked to speak with the hospitalist on call if the hospitalist that took care of you is not available. Once you are discharged, your primary care physician will handle any further medical issues. Please note that NO REFILLS for any discharge medications will be authorized once you are discharged, as it is imperative that you return to your primary care physician (or establish a relationship with a primary care physician if you do not have one) for your aftercare needs so that they can reassess your need for medications and monitor your lab values.

## 2016-12-26 NOTE — Progress Notes (Signed)
   Progress Note  Patient Name: Heather [name] Date of Encounter: 12/26/2016  Primary Cardiologist: Dr. Kate Sable  Please see full consultation note with recommendations from this morning. Echocardiogram reviewed showing vigorous LVEF and no wall motion abnormalities. No further ischemic testing is planned at this time. Would recommend aggressive risk factor modification, better control of diabetes mellitus and lipid status. She needs to keep close follow-up with her endocrinologist. Regimen should include aspirin and statin. We will sign off for now.  Signed, Rozann Lesches, MD  12/26/2016, 12:06 PM

## 2016-12-26 NOTE — Progress Notes (Signed)
Went over discharge paperwork with patient, verbalized understanding. IV was removed. Patient tolerated procedure well. Prescriptions given to patient.

## 2016-12-26 NOTE — Progress Notes (Signed)
*  PRELIMINARY RESULTS* Echocardiogram 2D Echocardiogram has been performed.  Heather [name] 12/26/2016, 11:19 AM

## 2016-12-26 NOTE — Care Management Note (Signed)
Case Management Note  Patient Details  Name: Heather [name] MRN: [id number] Date of Birth: 12-26-1950  Subjective/Objective:                  Pt admitted with r/o CP. Pt is from home, ind with ADL's. Pt has PCP - [name]. She has transportation to appointments. Has insurance with drug coverage and no difficulty affording or managing medications. No HH or DME pta. Pt plans to return home with self care at DC.   Action/Plan: No CM needs anticipated.   Expected Discharge Date:      12/26/2016            Expected Discharge Plan:  Home/Self Care  In-House Referral:  NA  Discharge planning Services  CM Consult  Post Acute Care Choice:  NA Choice offered to:  NA  Status of Service:  Completed, signed off  Sherald Barge, RN 12/26/2016, 10:31 AM

## 2016-12-26 NOTE — Care Management Obs Status (Signed)
[facility] NOTIFICATION   Patient Details  Name: CATERIN TABARES MRN: [id number] Date of Birth: [birth year]   Medicare Observation Status Notification Given:  Yes    Sherald Barge, RN 12/26/2016, 10:09 AM

## 2016-12-26 NOTE — Progress Notes (Signed)
Inpatient Diabetes Program Recommendations  AACE/ADA: New Consensus Statement on Inpatient Glycemic Control (2015)  Target Ranges:  Prepandial:   less than 140 mg/dL      Peak postprandial:   less than 180 mg/dL (1-2 hours)      Critically ill patients:  140 - 180 mg/dL   Lab Results  Component Value Date   GLUCAP 298 (H) 12/26/2016   HGBA1C 12 10/30/2016    Review of Glycemic Control Results for Heather [name], Heather [name] (MRN [id number]) as of 12/26/2016 11:55  Ref. Range 12/25/2016 22:03 12/26/2016 07:49 12/26/2016 11:09  Glucose-Capillary Latest Ref Range: 65 - 99 mg/dL 241 (H) 309 (H) 298 (H)   Diabetes history: DM2 Outpatient Diabetes medications: Levemir 80 units qd + Humalog 18-24 units tid meal coverage + Victoza qd Current orders for Inpatient glycemic control: Levemir 80 units qd + Novolog correction 0-15 units tid  Inpatient Diabetes Program Recommendations:  Noted seen by Dr. Dorris Fetch 11/30/16 and 12/25/16 and was transferred here from his office. Please consider: -Novolog 9 units meal coverage if eats 50% -Add Novolog correction 0-5 units hs  Thank you, Bethena Roys E. Jaking Thayer, RN, MSN, CDE Inpatient Glycemic Control Team Team Pager [phone] (8am-5pm) 12/26/2016 12:07 PM

## 2016-12-27 LAB — HIV ANTIBODY (ROUTINE TESTING W REFLEX): HIV SCREEN 4TH GENERATION: NONREACTIVE

## 2017-01-04 ENCOUNTER — Encounter (INDEPENDENT_AMBULATORY_CARE_PROVIDER_SITE_OTHER): Payer: Self-pay

## 2017-01-04 ENCOUNTER — Ambulatory Visit (INDEPENDENT_AMBULATORY_CARE_PROVIDER_SITE_OTHER): Payer: Medicare Other | Admitting: Internal Medicine

## 2017-01-04 ENCOUNTER — Other Ambulatory Visit (INDEPENDENT_AMBULATORY_CARE_PROVIDER_SITE_OTHER): Payer: Medicare Other

## 2017-01-04 ENCOUNTER — Encounter: Payer: Self-pay | Admitting: Internal Medicine

## 2017-01-04 ENCOUNTER — Telehealth: Payer: Self-pay | Admitting: "Endocrinology

## 2017-01-04 VITALS — BP 154/90 | Ht 63.0 in | Wt 164.1 lb

## 2017-01-04 DIAGNOSIS — R1013 Epigastric pain: Secondary | ICD-10-CM

## 2017-01-04 DIAGNOSIS — E781 Pure hyperglyceridemia: Secondary | ICD-10-CM

## 2017-01-04 DIAGNOSIS — K580 Irritable bowel syndrome with diarrhea: Secondary | ICD-10-CM

## 2017-01-04 LAB — AMYLASE: AMYLASE: 52 U/L (ref 27–131)

## 2017-01-04 LAB — LIPASE: LIPASE: 113 U/L — AB (ref 11.0–59.0)

## 2017-01-04 LAB — TRIGLYCERIDES: TRIGLYCERIDES: 640 mg/dL — AB (ref 0.0–149.0)

## 2017-01-04 MED ORDER — BLOOD GLUCOSE MONITOR KIT: PACK | 0 refills | Status: DC

## 2017-01-04 NOTE — Telephone Encounter (Signed)
rx sent

## 2017-01-04 NOTE — Telephone Encounter (Signed)
Patient has a Prodigy Meter but her test strips are over $100 and she cannot afford them. She has not had any strips for two months. Can we call in another meter that might be cheaper?  She uses the [facility].[employer]

## 2017-01-04 NOTE — Patient Instructions (Addendum)
Your physician has requested that you go to the basement for  lab work before leaving today. We are going to call you with results and plans.    Hold your victoza over the weekend.    I appreciate the opportunity to care for you. Silvano Rusk, MD, [facility]

## 2017-01-04 NOTE — Progress Notes (Signed)
Heather [name] 66 y.o. [birth year] [id number]  Assessment & Plan:   Encounter Diagnoses  Name Primary?  . Abdominal pain, epigastric Yes  . Irritable bowel syndrome with diarrhea   . Hypertriglyceridemia    I wonder if increased TG's, Victoza or both could be causing pancreatitis as cause of her epigastric pain sxs  Lab Results  Component Value Date   LIPASE 113.0 (H) 01/04/2017   Lab Results  Component Value Date   AMYLASE 52 01/04/2017   Triglycrides 640  So both still possible  I had her hold Victoza and will call in few days to recheck, bland diet also  Prbably needs TG specific lipid agent like gemgibrozil and definitely needs BS control  Diabetic neuropathy possible also Hgb A1C is 12 % - BS control big issue Alosetron is working well for IBS-D I appreciate the opportunity to care for this patient. CX:KGYJ,EHUDJS, MD    Subjective:   Chief Complaint: epigastric pain  HPI  Recenty hospitalized w/ epigastric pain and nausea/vomiting + chest pain sxs at [facility]'Had TG's > 1000 BP elevated > 970 systolic Lipase not checked Got better w/ conservative Rx, R/O MI Feels better but still w/ epigastric pain and nausea  Allergies  Allergen Reactions  . Duloxetine Hcl Swelling  . Fluocinolone Swelling  . Acetaminophen Itching and Swelling    Pt states "some swelling"  . Amlodipine Besylate Swelling    Swelling of feet, fluid retention  . Ciprofloxacin Nausea And Vomiting and Swelling    Swelling of face, jaw, and lips  . Hydrocodone-Acetaminophen Itching  . Metformin Other (See Comments)    REACTION: GI UPSET  . Nsaids Other (See Comments)    "HAS BLEEDING ULCERS"  . Quinolones Swelling  . Sulfamethoxazole Swelling    Swelling of feet, legs  . Sulfonamide Derivatives Swelling  . Valsartan Swelling  . Cozaar [Losartan] Swelling and Rash  . Lisinopril Swelling and Rash    Oral swelling, and red streaks on arms/stomach   Current Meds  Medication  Sig  . allopurinol (ZYLOPRIM) 100 MG tablet Take 100 mg by mouth daily.  Marland Kitchen alosetron (LOTRONEX) 0.5 MG tablet TAKE ONE (1) TABLET BY MOUTH TWICE DAILY  . aspirin EC 81 MG tablet Take 1 tablet (81 mg total) by mouth daily.  Marland Kitchen atorvastatin (LIPITOR) 80 MG tablet Take 1 tablet (80 mg total) by mouth daily at 6 PM.  . blood glucose meter kit and supplies KIT Dispense based on patient and insurance preference. Use up to four times daily as directed. (FOR ICD-10 E11.65)  . Blood Glucose Monitoring Suppl (ONETOUCH VERIO) w/Device KIT 1 each by Does not apply route as needed. Test 4 times daily  . citalopram (CELEXA) 40 MG tablet Take 40 mg by mouth daily.   . cyanocobalamin (,VITAMIN B-12,) 1000 MCG/ML injection Inject 1,000 mcg into the muscle every 30 (thirty) days.    . diazepam (VALIUM) 5 MG tablet Take 5 mg by mouth 2 (two) times daily as needed for anxiety.  . diphenoxylate-atropine (LOMOTIL) 2.5-0.025 MG tablet Take 2 tablets by mouth 4 (four) times daily as needed for diarrhea or loose stools.  Marland Kitchen esomeprazole (NEXIUM) 40 MG capsule Take 40 mg by mouth daily before breakfast.    . furosemide (LASIX) 40 MG tablet Take 1 tablet (40 mg total) by mouth daily.  Marland Kitchen glucose blood (ONE TOUCH TEST STRIPS) test strip Use as instructed 4 x daily. E11.65  . Insulin Detemir (LEVEMIR FLEXTOUCH) 100 UNIT/ML Pen Inject 90  Units into the skin at bedtime.  . Insulin Lispro (HUMALOG KWIKPEN [place]) Inject 18-24 Units into the skin 3 (three) times daily with meals.  . Liraglutide (VICTOZA) 18 MG/3ML SOPN Inject 1.2 mg into the skin daily.  . metoprolol (LOPRESSOR) 100 MG tablet Take 100 mg by mouth 2 (two) times daily.   Marland Kitchen omega-3 acid ethyl esters (LOVAZA) 1 g capsule Take 1 capsule (1 g total) by mouth 2 (two) times daily.  . potassium chloride SA (K-DUR,KLOR-CON) 20 MEQ tablet Take 20 mEq by mouth daily.  Marland Kitchen tiZANidine (ZANAFLEX) 4 MG tablet Take 8 mg by mouth at bedtime.   Past Medical History:  Diagnosis Date  .  B12 deficiency   . Cellulitis and abscess    Abdomen and buttocks  . CKD (chronic kidney disease) stage 3, GFR 30-59 ml/min   . Depression   . Diabetic neuropathy ([place])   . DJD (degenerative joint disease)    Right forminal stenosis C4-5  . Essential hypertension   . Folliculitis   . Gout   . Headache(784.0)   . Hiatal hernia   . History of bronchitis   . History of cardiac catheterization    No significant CAD 2012  . Insomnia   . Irritable bowel syndrome   . Mixed hyperlipidemia   . Osteoarthritis   . Palpitations   . Reflux esophagitis   . Tinnitus    Right  . Type 2 diabetes mellitus [facility])    Past Surgical History:  Procedure Laterality Date  . c-spine surgery     03/2007  . CARDIAC CATHETERIZATION    . CHOLECYSTECTOMY    . COLONOSCOPY  04/21/2011; 05/29/11   6/12: [facility] - ?AVM at IC valve, inflammatory changes at [facility] valve [facility]); 7/12 - gessner; IC valve erosions, look chronic and probably Crohn's per path  . ESOPHAGOGASTRODUODENOSCOPY  05/29/11   normal  . ESOPHAGOGASTRODUODENOSCOPY N/A 01/21/2016   Procedure: ESOPHAGOGASTRODUODENOSCOPY (EGD);  Surgeon: Wilford Corner, MD;  Location: [facility] ENDOSCOPY;  Service: Endoscopy;  Laterality: N/A;  . JOINT REPLACEMENT    . Right knee arthroscopic surgery    . TOTAL ABDOMINAL HYSTERECTOMY      Medications, allergies, past medical history, past surgical history, family history and social history are reviewed and updated in the EMR.  Review of Systems As above  Objective:   Physical Exam '@BP'  (!) 154/90   Ht '5\' 3"'  (1.6 m)   Wt 164 lb 2 oz (74.4 kg)   BMI 29.07 kg/m @  General:  NAD Eyes:   anicteric Lungs:  clear Heart::  S1S2 no rubs, murmurs or gallops Abdomen:  soft and mildly tender epigastrium, BS+ Ext:   no edema, cyanosis or clubbing    Data Reviewed:  Hospital notes, labs  Lab Results  Component Value Date   HGBA1C 12 10/30/2016

## 2017-01-05 ENCOUNTER — Encounter: Payer: Self-pay | Admitting: Internal Medicine

## 2017-01-07 NOTE — Progress Notes (Signed)
Lab tests suggest she is having pancreatitis High TG and also Victoza might be causing Please get sx update - she held Victoza

## 2017-01-08 ENCOUNTER — Other Ambulatory Visit: Payer: Self-pay

## 2017-01-08 DIAGNOSIS — R1013 Epigastric pain: Secondary | ICD-10-CM

## 2017-01-08 DIAGNOSIS — R748 Abnormal levels of other serum enzymes: Secondary | ICD-10-CM

## 2017-01-08 NOTE — Progress Notes (Signed)
The pancreatitis can do this  Have her go to a liquid low fat diet  Schedule MR/MRCP no contrast dx acute pancreatitis

## 2017-01-15 ENCOUNTER — Telehealth: Payer: Self-pay | Admitting: Internal Medicine

## 2017-01-15 NOTE — Telephone Encounter (Signed)
All questions answered about hospital appts

## 2017-01-16 ENCOUNTER — Ambulatory Visit: Payer: Medicare Other | Admitting: Cardiovascular Disease

## 2017-01-18 ENCOUNTER — Other Ambulatory Visit: Payer: Self-pay | Admitting: Internal Medicine

## 2017-01-18 ENCOUNTER — Other Ambulatory Visit: Payer: Self-pay | Admitting: "Endocrinology

## 2017-01-18 ENCOUNTER — Ambulatory Visit (HOSPITAL_COMMUNITY)
Admission: RE | Admit: 2017-01-18 | Discharge: 2017-01-18 | Disposition: A | Discharge status: Home / Self Care | Payer: Medicare Other | Source: Ambulatory Visit | Attending: Internal Medicine | Admitting: Internal Medicine

## 2017-01-18 DIAGNOSIS — R748 Abnormal levels of other serum enzymes: Secondary | ICD-10-CM | POA: Diagnosis present

## 2017-01-18 DIAGNOSIS — R1013 Epigastric pain: Secondary | ICD-10-CM | POA: Diagnosis present

## 2017-01-18 DIAGNOSIS — Z9049 Acquired absence of other specified parts of digestive tract: Secondary | ICD-10-CM | POA: Diagnosis not present

## 2017-01-18 LAB — CREATININE, SERUM
Creatinine, Ser: 1.47 mg/dL — ABNORMAL HIGH (ref 0.44–1.00)
GFR, EST AFRICAN AMERICAN: 42 mL/min — AB (ref >60)
GFR, EST NON AFRICAN AMERICAN: 37 mL/min — AB (ref >60)

## 2017-01-18 NOTE — Progress Notes (Signed)
Pancreas looks ok How is she?

## 2017-01-21 ENCOUNTER — Telehealth: Payer: Self-pay | Admitting: Internal Medicine

## 2017-01-21 MED ORDER — METOCLOPRAMIDE HCL 10 MG PO TABS: 10.0000 mg | ORAL_TABLET | Freq: Three times a day (TID) | ORAL | 1 refills | Status: DC

## 2017-01-21 NOTE — Telephone Encounter (Signed)
Patient notified Follow up scheduled for 03/08/17

## 2017-01-21 NOTE — Telephone Encounter (Signed)
metaclopramide 10 mg qac # 90 1 RF See me 4-8 weeks - what is next available

## 2017-01-21 NOTE — Telephone Encounter (Signed)
patient is still having pain with meals.  She would like to know the next step.  See MRi results from 01/18/17

## 2017-01-30 ENCOUNTER — Ambulatory Visit: Payer: Medicare Other | Admitting: Cardiology

## 2017-03-04 ENCOUNTER — Other Ambulatory Visit: Payer: Self-pay | Admitting: Internal Medicine

## 2017-03-05 NOTE — Telephone Encounter (Signed)
Left message to call me back to see if she wants 90 day supply at a time.

## 2017-03-05 NOTE — Telephone Encounter (Signed)
Please advise [name], thank you. 

## 2017-03-05 NOTE — Telephone Encounter (Signed)
OK to refill x 1 year See if she wants 90 d supplies thanks

## 2017-03-07 ENCOUNTER — Ambulatory Visit (INDEPENDENT_AMBULATORY_CARE_PROVIDER_SITE_OTHER): Payer: Medicare Other | Admitting: "Endocrinology

## 2017-03-07 ENCOUNTER — Encounter: Payer: Self-pay | Admitting: "Endocrinology

## 2017-03-07 VITALS — BP 167/94 | HR 94 | Ht 63.0 in | Wt 164.0 lb

## 2017-03-07 DIAGNOSIS — E1122 Type 2 diabetes mellitus with diabetic chronic kidney disease: Secondary | ICD-10-CM

## 2017-03-07 DIAGNOSIS — E782 Mixed hyperlipidemia: Secondary | ICD-10-CM | POA: Diagnosis not present

## 2017-03-07 DIAGNOSIS — Z794 Long term (current) use of insulin: Secondary | ICD-10-CM

## 2017-03-07 DIAGNOSIS — I10 Essential (primary) hypertension: Secondary | ICD-10-CM | POA: Diagnosis not present

## 2017-03-07 LAB — HEMOGLOBIN A1C: Hemoglobin A1C: 8.9

## 2017-03-07 MED ORDER — LIRAGLUTIDE 18 MG/3ML ~~LOC~~ SOPN: PEN_INJECTOR | SUBCUTANEOUS | 2 refills | Status: DC

## 2017-03-07 NOTE — Progress Notes (Signed)
Directly to [facility] emergency room due to persistent left-sided chest pain 6 hours associated with nausea/vomiting. Pain radiating down her left arm. She is a  high risk fot coronary artery disease .  Subjective:    Patient ID: Heather [name], female    DOB: Aug 16, 1951. Patient is being seen in f/u for management of diabetes requested by  [name], MD  Past Medical History:  Diagnosis Date  . B12 deficiency   . Cellulitis and abscess    Abdomen and buttocks  . CKD (chronic kidney disease) stage 3, GFR 30-59 ml/min   . Depression   . Diabetic neuropathy ([place])   . DJD (degenerative joint disease)    Right forminal stenosis C4-5  . Essential hypertension   . Folliculitis   . Gout   . Headache(784.0)   . Hiatal hernia   . History of bronchitis   . History of cardiac catheterization    No significant CAD 2012  . Insomnia   . Irritable bowel syndrome   . Mixed hyperlipidemia   . Osteoarthritis   . Palpitations   . Reflux esophagitis   . Tinnitus    Right  . Type 2 diabetes mellitus [facility])    Past Surgical History:  Procedure Laterality Date  . c-spine surgery     03/2007  . CARDIAC CATHETERIZATION    . CHOLECYSTECTOMY    . COLONOSCOPY  04/21/2011; 05/29/11   6/12: [facility] - ?AVM at IC valve, inflammatory changes at [facility] valve [facility]); 7/12 - gessner; IC valve erosions, look chronic and probably Crohn's per path  . ESOPHAGOGASTRODUODENOSCOPY  05/29/11   normal  . ESOPHAGOGASTRODUODENOSCOPY N/A 01/21/2016   Procedure: ESOPHAGOGASTRODUODENOSCOPY (EGD);  Surgeon: Wilford Corner, MD;  Location: [facility] ENDOSCOPY;  Service: Endoscopy;  Laterality: N/A;  . JOINT REPLACEMENT    . Right knee arthroscopic surgery    . TOTAL ABDOMINAL HYSTERECTOMY     Social History   Social History  . Marital status: Married    Spouse name: N/A  . Number of children: 2  . Years of education: N/A   Occupational History  . Disabled    Social History Main Topics  .  Smoking status: Never Smoker  . Smokeless tobacco: Never Used  . Alcohol use No  . Drug use: No  . Sexual activity: Not Asked   Other Topics Concern  . None   Social History Narrative   2 glasses of tea daily    Outpatient Encounter Prescriptions as of 03/07/2017  Medication Sig  . allopurinol (ZYLOPRIM) 100 MG tablet Take 100 mg by mouth daily.  Marland Kitchen alosetron (LOTRONEX) 0.5 MG tablet TAKE ONE (1) TABLET BY MOUTH TWICE DAILY  . aspirin EC 81 MG tablet Take 1 tablet (81 mg total) by mouth daily.  Marland Kitchen atorvastatin (LIPITOR) 80 MG tablet Take 1 tablet (80 mg total) by mouth daily at 6 PM.  . blood glucose meter kit and supplies KIT Dispense based on patient and insurance preference. Use up to four times daily as directed. (FOR ICD-10 E11.65)  . Blood Glucose Monitoring Suppl (ONETOUCH VERIO) w/Device KIT 1 each by Does not apply route as needed. Test 4 times daily  . citalopram (CELEXA) 40 MG tablet Take 40 mg by mouth daily.   . cyanocobalamin (,VITAMIN B-12,) 1000 MCG/ML injection Inject 1,000 mcg into the muscle every 30 (thirty) days.    . diazepam (VALIUM) 5 MG tablet Take 5 mg by mouth 2 (two) times daily as needed for anxiety.  Marland Kitchen  diphenoxylate-atropine (LOMOTIL) 2.5-0.025 MG tablet Take 2 tablets by mouth 4 (four) times daily as needed for diarrhea or loose stools.  Marland Kitchen esomeprazole (NEXIUM) 40 MG capsule Take 40 mg by mouth daily before breakfast.    . furosemide (LASIX) 40 MG tablet Take 1 tablet (40 mg total) by mouth daily.  Marland Kitchen glucose blood (ONE TOUCH TEST STRIPS) test strip Use as instructed 4 x daily. E11.65  . Insulin Detemir (LEVEMIR FLEXTOUCH) 100 UNIT/ML Pen Inject 90 Units into the skin at bedtime.  . Insulin Lispro (HUMALOG KWIKPEN [place]) Inject 20-26 Units into the skin 3 (three) times daily with meals.  . liraglutide (VICTOZA) 18 MG/3ML SOPN USE 1.8 MG EVERY DAY AS DIRECTED  . metoCLOPramide (REGLAN) 10 MG tablet Take 1 tablet (10 mg total) by mouth 3 (three) times daily before  meals.  . metoprolol (LOPRESSOR) 100 MG tablet Take 100 mg by mouth 2 (two) times daily.   Marland Kitchen omega-3 acid ethyl esters (LOVAZA) 1 g capsule Take 1 capsule (1 g total) by mouth 2 (two) times daily.  . potassium chloride SA (K-DUR,KLOR-CON) 20 MEQ tablet Take 20 mEq by mouth daily.  Marland Kitchen tiZANidine (ZANAFLEX) 4 MG tablet Take 8 mg by mouth at bedtime.  . [DISCONTINUED] VICTOZA 18 MG/3ML SOPN USE 0.6 TO 1.2MG EVERY DAY AS DIRECTED   No facility-administered encounter medications on file as of 03/07/2017.    ALLERGIES: Allergies  Allergen Reactions  . Duloxetine Hcl Swelling  . Fluocinolone Swelling  . Acetaminophen Itching and Swelling    Pt states "some swelling"  . Amlodipine Besylate Swelling    Swelling of feet, fluid retention  . Ciprofloxacin Nausea And Vomiting and Swelling    Swelling of face, jaw, and lips  . Hydrocodone-Acetaminophen Itching  . Metformin Other (See Comments)    REACTION: GI UPSET  . Nsaids Other (See Comments)    "HAS BLEEDING ULCERS"  . Quinolones Swelling  . Sulfamethoxazole Swelling    Swelling of feet, legs  . Sulfonamide Derivatives Swelling  . Valsartan Swelling  . Cozaar [Losartan] Swelling and Rash  . Lisinopril Swelling and Rash    Oral swelling, and red streaks on arms/stomach   VACCINATION STATUS: Immunization History  Administered Date(s) Administered  . Influenza,inj,Quad PF,36+ Mos 12/26/2016  . Pneumococcal Polysaccharide-23 12/26/2016    Diabetes  She presents for her follow-up diabetic visit. She has type 2 diabetes mellitus. Onset time: she was diagnosed at approx age of 68 yrs. Her disease course has been improving (her recent a1c was 12.8%.). There are no hypoglycemic associated symptoms. Pertinent negatives for hypoglycemia include no confusion, headaches, pallor or seizures. Associated symptoms include blurred vision, chest pain, fatigue, polydipsia and polyuria. Pertinent negatives for diabetes include no polyphagia. There are no  hypoglycemic complications. Symptoms are improving. Diabetic complications include heart disease and nephropathy. Risk factors for coronary artery disease include diabetes mellitus, dyslipidemia, family history, hypertension, obesity and sedentary lifestyle. Current diabetic treatments: she is taking levemir 90 units qhs, Humalog 26-28 units TIDAC, Victoza 0.77m daily. Compliance with diabetes treatment: she was seen by me 3 yrs ago, disappeared from care. Her weight is stable. She is following a generally unhealthy diet. When asked about meal planning, she reported none. She has had a previous visit with a dietitian (she did not keep appointments.). She rarely participates in exercise. Her breakfast blood glucose range is generally 140-180 mg/dl. Her lunch blood glucose range is generally 180-200 mg/dl. Her dinner blood glucose range is generally 180-200 mg/dl. Her bedtime  blood glucose range is generally 180-200 mg/dl. Her overall blood glucose range is 180-200 mg/dl. (She monitors randomly) An ACE inhibitor/angiotensin II receptor blocker is being taken. Eye exam is current.  Hyperlipidemia  This is a chronic problem. The current episode started more than 1 year ago. Associated symptoms include chest pain and shortness of breath. Pertinent negatives include no myalgias. She is currently on no antihyperlipidemic treatment (reported statin intolerance.). Risk factors for coronary artery disease include dyslipidemia, diabetes mellitus, hypertension, obesity and a sedentary lifestyle.  Hypertension  This is a chronic problem. The current episode started more than 1 year ago. The problem is controlled. Associated symptoms include blurred vision, chest pain and shortness of breath. Pertinent negatives include no headaches or palpitations. Risk factors for coronary artery disease include diabetes mellitus, dyslipidemia and sedentary lifestyle. Past treatments include beta blockers (allergic to ARB). Hypertensive  end-organ damage includes kidney disease and CAD/MI.       Review of Systems  Constitutional: Positive for fatigue. Negative for chills, fever and unexpected weight change.  HENT: Negative for trouble swallowing and voice change.   Eyes: Positive for blurred vision. Negative for visual disturbance.  Respiratory: Positive for chest tightness and shortness of breath. Negative for cough and wheezing.   Cardiovascular: Positive for chest pain. Negative for palpitations and leg swelling.  Gastrointestinal: Negative for diarrhea, nausea and vomiting.  Endocrine: Positive for polydipsia and polyuria. Negative for cold intolerance, heat intolerance and polyphagia.  Genitourinary: Positive for frequency.  Musculoskeletal: Negative for arthralgias and myalgias.  Skin: Negative for color change, pallor, rash and wound.  Neurological: Negative for seizures and headaches.  Psychiatric/Behavioral: Negative for confusion and suicidal ideas.    Objective:    BP (!) 167/94   Pulse 94   Ht '5\' 3"'  (1.6 m)   Wt 164 lb (74.4 kg)   BMI 29.05 kg/m   Wt Readings from Last 3 Encounters:  03/07/17 164 lb (74.4 kg)  01/04/17 164 lb 2 oz (74.4 kg)  12/25/16 164 lb 12.8 oz (74.8 kg)    Physical Exam  Constitutional: She is oriented to person, place, and time. She appears well-developed.  HENT:  Head: Normocephalic and atraumatic.  Eyes: EOM are normal.  Neck: Normal range of motion. Neck supple. No tracheal deviation present. No thyromegaly present.  Cardiovascular: Normal rate and regular rhythm.   Pulmonary/Chest: Effort normal and breath sounds normal.  Abdominal: Soft. Bowel sounds are normal. There is no tenderness. There is no guarding.  Musculoskeletal: Normal range of motion. She exhibits no edema.  Neurological: She is alert and oriented to person, place, and time. She has normal reflexes. No cranial nerve deficit. Coordination normal.  Skin: Skin is warm and dry. No rash noted. No erythema.  No pallor.  Psychiatric: Judgment normal.  She has reluctant affect.    CMP     Component Value Date/Time   NA 137 12/25/2016 1838   K 3.6 12/25/2016 1838   CL 98 (L) 12/25/2016 1838   CO2 25 12/25/2016 1838   GLUCOSE 238 (H) 12/25/2016 1838   BUN 27 (H) 12/25/2016 1838   CREATININE 1.47 (H) 01/18/2017 1055   CALCIUM 10.1 12/25/2016 1838   PROT 7.6 08/15/2016 2232   ALBUMIN 4.2 08/15/2016 2232   AST 33 08/15/2016 2232   ALT 52 08/15/2016 2232   ALKPHOS 103 08/15/2016 2232   BILITOT 0.6 08/15/2016 2232   GFRNONAA 37 (L) 01/18/2017 1055   GFRAA 42 (L) 01/18/2017 1055    Diabetic Labs (  most recent): Lab Results  Component Value Date   HGBA1C 8.9 03/07/2017   HGBA1C 12 10/30/2016   HGBA1C 9.0 (H) 12/22/2014    Assessment & Plan:   1. Type 2 diabetes mellitus with chronic kidney disease, with long-term current use of insulin, unspecified CKD stage ([place])  - Patient has currently uncontrolled symptomatic type 2 DM since 66 years of age. - She came with improved A1c of 8.9% from 12 %. Recent labs reviewed.   Her diabetes is complicated by Cardiovascular disease, CKD and patient remains at a high risk for more acute and chronic complications of diabetes which include CAD, CVA, CKD, retinopathy, and neuropathy. These are all discussed in detail with the patient.  - I have counseled the patient on diet management and weight loss, by adopting a carbohydrate restricted/protein rich diet.  - Suggestion is made for patient to avoid simple carbohydrates   from their diet including Cakes , Desserts, Ice Cream,  Soda (  diet and regular) , Sweet Tea , Candies,  Chips, Cookies, Artificial Sweeteners,   and "Sugar-free" Products . This will help patient to have stable blood glucose profile and potentially avoid unintended weight gain.  - I encouraged the patient to switch to  unprocessed or minimally processed complex starch and increased protein intake (animal or plant source), fruits,  and vegetables.  - Patient is advised to stick to a routine mealtimes to eat 3 meals  a day and avoid unnecessary snacks ( to snack only to correct hypoglycemia).  - The patient will be scheduled with Jearld Fenton, RDN, CDE for individualized DM education.  - I have approached patient with the following individualized plan to manage diabetes and patient agrees:   - She has a known history of coronary artery disease. I  will proceed to readjust her basal/bolus insulin: continue levemir 80 units daily at bed time, increase her  prandial insulin Humalog to 20  units TIDAC for pre-meal BG readings of 90-126m/dl, plus patient specific correction dose for unexpected hyperglycemia above 158mdl, associated with strict monitoring of glucose  AC and HS. - Patient is warned not to take insulin without proper monitoring per orders. -Adjustment parameters are given for hypo and hyperglycemia in writing. -Patient is encouraged to call clinic for blood glucose levels less than 70 or above 300 mg /dl. - I will  increase Victoza to 1.8 mg subcutaneously daily , therapeutically suitable for patient.  -Patient is not a candidate for MTF, SGLT2i due to CKD.  - Patient specific target  A1c;  LDL, HDL, Triglycerides, and  Waist Circumference were discussed in detail.  2) BP/HTN: Controlled. Continue current medications including metoprolol. She does not tolerate ARB.. Marland Kitchen) Lipids/HPL:   Control unknown , will obtain FLP on subsequent labs. She reports statin intolerance.  4)  Weight/Diet: seh quits her f/u with CDE- hesitant to go back , exercise, and detailed carbohydrates information provided.  5) Chronic Care/Health Maintenance:  -Patient is  encouraged to continue to follow up with Ophthalmology, Nephrology,  Podiatrist at least yearly or according to recommendations, and advised to  stay away from smoking. I have recommended yearly flu vaccine and pneumonia vaccination at least every 5 years; moderate  intensity exercise for up to 150 minutes weekly; and  sleep for at least 7 hours a day.  - 30 minutes of time was spent on the care of this patient , 50% of which was applied for counseling on diabetes complications and their preventions.  - Patient to  bring meter and  blood glucose logs during her next visit.   - I advised patient to maintain close follow up with [name], MD for primary care needs.  Follow up plan: - Return for meter, and logs.  Glade Lloyd, MD Phone: [phone]  Fax: [phone]   03/07/2017, 4:30 PM

## 2017-03-07 NOTE — Patient Instructions (Signed)

## 2017-03-08 ENCOUNTER — Ambulatory Visit (INDEPENDENT_AMBULATORY_CARE_PROVIDER_SITE_OTHER): Payer: Medicare Other | Admitting: Internal Medicine

## 2017-03-08 ENCOUNTER — Encounter: Payer: Self-pay | Admitting: Internal Medicine

## 2017-03-08 ENCOUNTER — Telehealth: Payer: Self-pay

## 2017-03-08 VITALS — BP 128/70 | HR 100 | Ht 62.6 in | Wt 164.1 lb

## 2017-03-08 DIAGNOSIS — Z8719 Personal history of other diseases of the digestive system: Secondary | ICD-10-CM | POA: Diagnosis not present

## 2017-03-08 DIAGNOSIS — K580 Irritable bowel syndrome with diarrhea: Secondary | ICD-10-CM | POA: Diagnosis not present

## 2017-03-08 DIAGNOSIS — R112 Nausea with vomiting, unspecified: Secondary | ICD-10-CM

## 2017-03-08 DIAGNOSIS — E781 Pure hyperglyceridemia: Secondary | ICD-10-CM

## 2017-03-08 MED ORDER — FENOFIBRATE 145 MG PO TABS: 145.0000 mg | ORAL_TABLET | Freq: Every day | ORAL | 3 refills | Status: DC

## 2017-03-08 MED ORDER — ALOSETRON HCL 1 MG PO TABS: 1.0000 mg | ORAL_TABLET | Freq: Two times a day (BID) | ORAL | 2 refills | Status: DC

## 2017-03-08 NOTE — Telephone Encounter (Signed)
Patient came in to see Dr Carlean Purl today and we sent in new rx with increased dosage.

## 2017-03-08 NOTE — Patient Instructions (Addendum)
  We have sent the following medications to your pharmacy for you to pick up at your convenience: Fenofibrate   Increase your Lotronex to twice a day. Rx sent in.   Call us in 1-2 weeks with an update.   Follow up with Dr Carlean Purl in 2 months.   I appreciate the opportunity to care for you. Silvano Rusk, MD, [facility]

## 2017-03-08 NOTE — Telephone Encounter (Signed)
-----   Message from Gatha Mayer, MD sent at 03/08/2017  5:06 PM EDT ----- Regarding: Fax please Please print out and fax today's note, the previous office note, any labs from March until now in the MRI report her PCP

## 2017-03-08 NOTE — Telephone Encounter (Signed)
Faxed the information requested to Dr Manuella Ghazi, fax # [phone].

## 2017-03-08 NOTE — Progress Notes (Signed)
Heather [name] 66 y.o. Oct 11, 1951 [id number]  Assessment & Plan:   Encounter Diagnoses  Name Primary?  . Irritable bowel syndrome with diarrhea Yes  . Hypertriglyceridemia   . Intractable vomiting with nausea, unspecified vomiting type   . Hx of pancreatitis     Overall she is improved. Not vomiting. Still having irritable bowel and dyspeptic-like symptoms. Question if she has some low-grade pancreatitis ongoing. EGD and colonoscopy negative late last year, imaging has been unrevealing, diabetes is under better control. I wonder if some of her symptoms are not diabetic neuropathic in origin. She's had delayed gastric emptying on a scanned years ago, could have some gastroparesis. The setting of that study was not entirely clear to me i.e. I think it was in the hospital so interpretation difficult as could've been on anti-emetics. MRI MRCP was negative in March.  Plan is to increase Lotronex to 1 mg twice a day. Start fenofibrate 145 mg daily for hypertriglyceridemia and to reduce risk of pancreatitis. Return to clinic in 2 months. She is to contact me within one to 2 weeks as an update on her symptoms. She is already on metoclopramide. Overall she does seem better. She also has an early satiety-like pain as well. Bloating sensation. Question of buspirone would help that. However her medication regimen is fairly complicated already. One thing to consider would be switching her from SSRI over 2 duloxetine. CC: [name], MD  Subjective:   Chief Complaint: Diarrhea abdominal pain nausea  HPI Heather [name] is here for follow-up, seen about 2 months ago. She says she's having diarrhea and abdominal pain. She thinks that Lotronex at 0.5 mg twice a day helped for several weeks but seems to have lost its efficacy. She saw endocrinology yesterday and her hemoglobin A1c is down from 12+ to 8.9. She stops it toes of but that did not help her abdominal pain at all so she returned to that.She has a  postprandial early satiety-like symptoms as well as intermittent sharp abdominal pains that will waken her up at night. Wt Readings from Last 3 Encounters:  03/08/17 164 lb 2 oz (74.4 kg)  03/07/17 164 lb (74.4 kg)  01/04/17 164 lb 2 oz (74.4 kg)   Allergies  Allergen Reactions  . Duloxetine Hcl Swelling  . Fluocinolone Swelling  . Acetaminophen Itching and Swelling    Pt states "some swelling"  . Amlodipine Besylate Swelling    Swelling of feet, fluid retention  . Ciprofloxacin Nausea And Vomiting and Swelling    Swelling of face, jaw, and lips  . Hydrocodone-Acetaminophen Itching  . Metformin Other (See Comments)    REACTION: GI UPSET  . Nsaids Other (See Comments)    "HAS BLEEDING ULCERS"  . Quinolones Swelling  . Sulfamethoxazole Swelling    Swelling of feet, legs  . Sulfonamide Derivatives Swelling  . Valsartan Swelling  . Cozaar [Losartan] Swelling and Rash  . Lisinopril Swelling and Rash    Oral swelling, and red streaks on arms/stomach   Current Meds  Medication Sig  . allopurinol (ZYLOPRIM) 100 MG tablet Take 100 mg by mouth daily.  . blood glucose meter kit and supplies KIT Dispense based on patient and insurance preference. Use up to four times daily as directed. (FOR ICD-10 E11.65)  . Blood Glucose Monitoring Suppl (ONETOUCH VERIO) w/Device KIT 1 each by Does not apply route as needed. Test 4 times daily  . citalopram (CELEXA) 40 MG tablet Take 40 mg by mouth daily.   . cyanocobalamin (,  VITAMIN B-12,) 1000 MCG/ML injection Inject 1,000 mcg into the muscle every 30 (thirty) days.    . diazepam (VALIUM) 5 MG tablet Take 5 mg by mouth 2 (two) times daily as needed for anxiety.  . diphenoxylate-atropine (LOMOTIL) 2.5-0.025 MG tablet Take 2 tablets by mouth 4 (four) times daily as needed for diarrhea or loose stools.  Marland Kitchen esomeprazole (NEXIUM) 40 MG capsule Take 40 mg by mouth daily before breakfast.    . furosemide (LASIX) 40 MG tablet Take 1 tablet (40 mg total) by  mouth daily.  Marland Kitchen glucose blood (ONE TOUCH TEST STRIPS) test strip Use as instructed 4 x daily. E11.65  . Insulin Detemir (LEVEMIR FLEXTOUCH) 100 UNIT/ML Pen Inject 90 Units into the skin at bedtime. (Patient taking differently: Inject 80 Units into the skin at bedtime. )  . Insulin Lispro (HUMALOG KWIKPEN [place]) Inject into the skin 3 (three) times daily with meals. Sliding scale  . liraglutide (VICTOZA) 18 MG/3ML SOPN USE 1.8 MG EVERY DAY AS DIRECTED  . metoCLOPramide (REGLAN) 10 MG tablet Take 1 tablet (10 mg total) by mouth 3 (three) times daily before meals.  . metoprolol (LOPRESSOR) 100 MG tablet Take 100 mg by mouth 2 (two) times daily.   Marland Kitchen omega-3 acid ethyl esters (LOVAZA) 1 g capsule Take 1 capsule (1 g total) by mouth 2 (two) times daily.  . potassium chloride SA (K-DUR,KLOR-CON) 20 MEQ tablet Take 20 mEq by mouth daily.  Marland Kitchen tiZANidine (ZANAFLEX) 4 MG tablet Take 8 mg by mouth at bedtime.  . [DISCONTINUED] alosetron (LOTRONEX) 0.5 MG tablet TAKE ONE (1) TABLET BY MOUTH TWICE DAILY  .     Past Medical History:  Diagnosis Date  . B12 deficiency   . Cellulitis and abscess    Abdomen and buttocks  . CKD (chronic kidney disease) stage 3, GFR 30-59 ml/min   . Depression   . Diabetic neuropathy ([place])   . DJD (degenerative joint disease)    Right forminal stenosis C4-5  . Essential hypertension   . Folliculitis   . Gout   . Headache(784.0)   . Hiatal hernia   . History of bronchitis   . History of cardiac catheterization    No significant CAD 2012  . Insomnia   . Irritable bowel syndrome   . Mixed hyperlipidemia   . Osteoarthritis   . Palpitations   . Reflux esophagitis   . Tinnitus    Right  . Type 2 diabetes mellitus [facility])    Past Surgical History:  Procedure Laterality Date  . c-spine surgery     03/2007  . CARDIAC CATHETERIZATION    . CHOLECYSTECTOMY    . COLONOSCOPY  04/21/2011; 05/29/11   6/12: [place] - ?AVM at IC valve, inflammatory changes at [facility] valve [facility]); 7/12 - Jaleigh Mccroskey; IC valve erosions, look chronic and probably Crohn's per path  . ESOPHAGOGASTRODUODENOSCOPY  05/29/11   normal  . ESOPHAGOGASTRODUODENOSCOPY N/A 01/21/2016   Procedure: ESOPHAGOGASTRODUODENOSCOPY (EGD);  Surgeon: Wilford Corner, MD;  Location: [facility] ENDOSCOPY;  Service: Endoscopy;  Laterality: N/A;  . JOINT REPLACEMENT    . Right knee arthroscopic surgery    . TOTAL ABDOMINAL HYSTERECTOMY       Review of Systems As above  Objective:   Physical Exam BP 128/70 (BP Location: Left Arm, Patient Position: Sitting, Cuff Size: Normal)   Pulse 100   Ht 5' 2.6" (1.59 m) Comment: height measured without shoes  Wt 164 lb 2 oz (74.4 kg)   BMI 29.45 kg/m  No acute distress  15 minutes time spent with patient > half in counseling coordination of care

## 2017-03-12 DIAGNOSIS — M542 Cervicalgia: Secondary | ICD-10-CM | POA: Insufficient documentation

## 2017-03-29 ENCOUNTER — Ambulatory Visit: Payer: Medicare Other | Admitting: "Endocrinology

## 2017-03-30 ENCOUNTER — Emergency Department (HOSPITAL_COMMUNITY): Payer: Medicare Other

## 2017-03-30 ENCOUNTER — Encounter (HOSPITAL_COMMUNITY): Payer: Self-pay

## 2017-03-30 ENCOUNTER — Inpatient Hospital Stay (HOSPITAL_COMMUNITY)
Admission: EM | Admit: 2017-03-30 | Discharge: 2017-04-03 | DRG: 481 | Disposition: A | Discharge status: Skilled Nursing Facility | Payer: Medicare Other | Attending: Nephrology | Admitting: Nephrology

## 2017-03-30 DIAGNOSIS — Z79899 Other long term (current) drug therapy: Secondary | ICD-10-CM

## 2017-03-30 DIAGNOSIS — R079 Chest pain, unspecified: Secondary | ICD-10-CM | POA: Diagnosis not present

## 2017-03-30 DIAGNOSIS — Y92000 Kitchen of unspecified non-institutional (private) residence as  the place of occurrence of the external cause: Secondary | ICD-10-CM | POA: Diagnosis not present

## 2017-03-30 DIAGNOSIS — K210 Gastro-esophageal reflux disease with esophagitis: Secondary | ICD-10-CM | POA: Diagnosis not present

## 2017-03-30 DIAGNOSIS — E538 Deficiency of other specified B group vitamins: Secondary | ICD-10-CM | POA: Diagnosis present

## 2017-03-30 DIAGNOSIS — W010XXA Fall on same level from slipping, tripping and stumbling without subsequent striking against object, initial encounter: Secondary | ICD-10-CM | POA: Diagnosis present

## 2017-03-30 DIAGNOSIS — I129 Hypertensive chronic kidney disease with stage 1 through stage 4 chronic kidney disease, or unspecified chronic kidney disease: Secondary | ICD-10-CM | POA: Diagnosis present

## 2017-03-30 DIAGNOSIS — S7291XA Unspecified fracture of right femur, initial encounter for closed fracture: Secondary | ICD-10-CM | POA: Diagnosis present

## 2017-03-30 DIAGNOSIS — M9711XA Periprosthetic fracture around internal prosthetic right knee joint, initial encounter: Secondary | ICD-10-CM | POA: Diagnosis present

## 2017-03-30 DIAGNOSIS — S72491A Other fracture of lower end of right femur, initial encounter for closed fracture: Secondary | ICD-10-CM | POA: Diagnosis present

## 2017-03-30 DIAGNOSIS — Z7982 Long term (current) use of aspirin: Secondary | ICD-10-CM | POA: Diagnosis not present

## 2017-03-30 DIAGNOSIS — K219 Gastro-esophageal reflux disease without esophagitis: Secondary | ICD-10-CM | POA: Diagnosis present

## 2017-03-30 DIAGNOSIS — S72009A Fracture of unspecified part of neck of unspecified femur, initial encounter for closed fracture: Secondary | ICD-10-CM

## 2017-03-30 DIAGNOSIS — Z833 Family history of diabetes mellitus: Secondary | ICD-10-CM | POA: Diagnosis not present

## 2017-03-30 DIAGNOSIS — S72001D Fracture of unspecified part of neck of right femur, subsequent encounter for closed fracture with routine healing: Secondary | ICD-10-CM | POA: Diagnosis not present

## 2017-03-30 DIAGNOSIS — Z419 Encounter for procedure for purposes other than remedying health state, unspecified: Secondary | ICD-10-CM | POA: Diagnosis not present

## 2017-03-30 DIAGNOSIS — E1165 Type 2 diabetes mellitus with hyperglycemia: Secondary | ICD-10-CM | POA: Diagnosis present

## 2017-03-30 DIAGNOSIS — I10 Essential (primary) hypertension: Secondary | ICD-10-CM | POA: Diagnosis not present

## 2017-03-30 DIAGNOSIS — Z794 Long term (current) use of insulin: Secondary | ICD-10-CM | POA: Diagnosis present

## 2017-03-30 DIAGNOSIS — K589 Irritable bowel syndrome without diarrhea: Secondary | ICD-10-CM | POA: Diagnosis present

## 2017-03-30 DIAGNOSIS — F329 Major depressive disorder, single episode, unspecified: Secondary | ICD-10-CM | POA: Diagnosis present

## 2017-03-30 DIAGNOSIS — E1121 Type 2 diabetes mellitus with diabetic nephropathy: Secondary | ICD-10-CM | POA: Diagnosis present

## 2017-03-30 DIAGNOSIS — I878 Other specified disorders of veins: Secondary | ICD-10-CM

## 2017-03-30 DIAGNOSIS — E1122 Type 2 diabetes mellitus with diabetic chronic kidney disease: Secondary | ICD-10-CM | POA: Diagnosis present

## 2017-03-30 DIAGNOSIS — D62 Acute posthemorrhagic anemia: Secondary | ICD-10-CM | POA: Diagnosis not present

## 2017-03-30 DIAGNOSIS — S72409A Unspecified fracture of lower end of unspecified femur, initial encounter for closed fracture: Secondary | ICD-10-CM

## 2017-03-30 DIAGNOSIS — E782 Mixed hyperlipidemia: Secondary | ICD-10-CM | POA: Diagnosis present

## 2017-03-30 DIAGNOSIS — N183 Chronic kidney disease, stage 3 unspecified: Secondary | ICD-10-CM | POA: Diagnosis present

## 2017-03-30 DIAGNOSIS — S7291XD Unspecified fracture of right femur, subsequent encounter for closed fracture with routine healing: Secondary | ICD-10-CM | POA: Diagnosis not present

## 2017-03-30 DIAGNOSIS — W19XXXA Unspecified fall, initial encounter: Secondary | ICD-10-CM | POA: Diagnosis not present

## 2017-03-30 DIAGNOSIS — E1140 Type 2 diabetes mellitus with diabetic neuropathy, unspecified: Secondary | ICD-10-CM | POA: Diagnosis present

## 2017-03-30 DIAGNOSIS — N179 Acute kidney failure, unspecified: Secondary | ICD-10-CM | POA: Diagnosis present

## 2017-03-30 DIAGNOSIS — N184 Chronic kidney disease, stage 4 (severe): Secondary | ICD-10-CM | POA: Diagnosis present

## 2017-03-30 DIAGNOSIS — F32A Depression, unspecified: Secondary | ICD-10-CM | POA: Diagnosis present

## 2017-03-30 HISTORY — DX: Unspecified fracture of right femur, initial encounter for closed fracture: S72.91XA

## 2017-03-30 LAB — BASIC METABOLIC PANEL
ANION GAP: 13 (ref 5–15)
BUN: 37 mg/dL — ABNORMAL HIGH (ref 6–20)
CHLORIDE: 98 mmol/L — AB (ref 101–111)
CO2: 20 mmol/L — AB (ref 22–32)
Calcium: 9.3 mg/dL (ref 8.9–10.3)
Creatinine, Ser: 2.49 mg/dL — ABNORMAL HIGH (ref 0.44–1.00)
GFR calc non Af Amer: 19 mL/min — ABNORMAL LOW (ref >60)
GFR, EST AFRICAN AMERICAN: 22 mL/min — AB (ref >60)
GLUCOSE: 399 mg/dL — AB (ref 65–99)
POTASSIUM: 3.8 mmol/L (ref 3.5–5.1)
Sodium: 131 mmol/L — ABNORMAL LOW (ref 135–145)

## 2017-03-30 LAB — CBC WITH DIFFERENTIAL/PLATELET
Basophils Absolute: 0 10*3/uL (ref 0.0–0.1)
Basophils Relative: 0 %
Eosinophils Absolute: 0 10*3/uL (ref 0.0–0.7)
Eosinophils Relative: 0 %
HEMATOCRIT: 31 % — AB (ref 36.0–46.0)
HEMOGLOBIN: 10 g/dL — AB (ref 12.0–15.0)
LYMPHS PCT: 11 %
Lymphs Abs: 1.4 10*3/uL (ref 0.7–4.0)
MCH: 27.9 pg (ref 26.0–34.0)
MCHC: 32.3 g/dL (ref 30.0–36.0)
MCV: 86.6 fL (ref 78.0–100.0)
MONO ABS: 1.2 10*3/uL — AB (ref 0.1–1.0)
MONOS PCT: 9 %
NEUTROS ABS: 10.2 10*3/uL — AB (ref 1.7–7.7)
NEUTROS PCT: 80 %
Platelets: 253 10*3/uL (ref 150–400)
RBC: 3.58 MIL/uL — ABNORMAL LOW (ref 3.87–5.11)
RDW: 15.1 % (ref 11.5–15.5)
WBC: 12.9 10*3/uL — ABNORMAL HIGH (ref 4.0–10.5)

## 2017-03-30 LAB — CK: Total CK: 176 U/L (ref 38–234)

## 2017-03-30 LAB — SURGICAL PCR SCREEN
MRSA, PCR: NEGATIVE
Staphylococcus aureus: NEGATIVE

## 2017-03-30 LAB — GLUCOSE, CAPILLARY
Glucose-Capillary: 211 mg/dL — ABNORMAL HIGH (ref 65–99)
Glucose-Capillary: 257 mg/dL — ABNORMAL HIGH (ref 65–99)

## 2017-03-30 MED ORDER — TIZANIDINE HCL 4 MG PO TABS: 8.0000 mg | ORAL_TABLET | Freq: Every evening | ORAL | Status: DC | PRN

## 2017-03-30 MED ORDER — MORPHINE SULFATE (PF) 4 MG/ML IV SOLN
4.0000 mg | INTRAVENOUS | Status: DC | PRN
Start: 2017-03-30 — End: 2017-03-30
  Filled 2017-03-30: qty 1

## 2017-03-30 MED ORDER — SODIUM CHLORIDE 0.9 % IV BOLUS (SEPSIS)
1000.0000 mL | Freq: Once | INTRAVENOUS | Status: AC
  Administered 2017-03-30: 1000 mL via INTRAVENOUS

## 2017-03-30 MED ORDER — MORPHINE SULFATE (PF) 4 MG/ML IV SOLN
4.0000 mg | Freq: Once | INTRAVENOUS | Status: AC
  Administered 2017-03-30: 4 mg via INTRAVENOUS
  Filled 2017-03-30: qty 1

## 2017-03-30 MED ORDER — INSULIN ASPART 100 UNIT/ML ~~LOC~~ SOLN
0.0000 [IU] | SUBCUTANEOUS | Status: DC
  Administered 2017-03-30: 11 [IU] via SUBCUTANEOUS
  Administered 2017-03-31 (×2): 7 [IU] via SUBCUTANEOUS
  Administered 2017-03-31: 11 [IU] via SUBCUTANEOUS
  Administered 2017-03-31: 7 [IU] via SUBCUTANEOUS
  Administered 2017-04-01: 4 [IU] via SUBCUTANEOUS
  Administered 2017-04-01: 7 [IU] via SUBCUTANEOUS
  Administered 2017-04-01 (×2): 4 [IU] via SUBCUTANEOUS
  Administered 2017-04-01: 7 [IU] via SUBCUTANEOUS
  Administered 2017-04-01 – 2017-04-02 (×2): 4 [IU] via SUBCUTANEOUS
  Administered 2017-04-02: 3 [IU] via SUBCUTANEOUS
  Administered 2017-04-02 (×2): 4 [IU] via SUBCUTANEOUS
  Administered 2017-04-02 – 2017-04-03 (×2): 7 [IU] via SUBCUTANEOUS
  Administered 2017-04-03: 4 [IU] via SUBCUTANEOUS

## 2017-03-30 MED ORDER — INSULIN DETEMIR 100 UNIT/ML ~~LOC~~ SOLN
40.0000 [IU] | Freq: Every day | SUBCUTANEOUS | Status: DC
  Administered 2017-03-30 – 2017-04-02 (×4): 40 [IU] via SUBCUTANEOUS
  Filled 2017-03-30 (×5): qty 0.4

## 2017-03-30 MED ORDER — METOCLOPRAMIDE HCL 10 MG PO TABS
10.0000 mg | ORAL_TABLET | Freq: Three times a day (TID) | ORAL | Status: DC
  Administered 2017-03-30 – 2017-04-03 (×11): 10 mg via ORAL
  Filled 2017-03-30 (×11): qty 1

## 2017-03-30 MED ORDER — ALOSETRON HCL 0.5 MG PO TABS: 0.5000 mg | ORAL_TABLET | Freq: Two times a day (BID) | ORAL | Status: DC

## 2017-03-30 MED ORDER — ATORVASTATIN CALCIUM 80 MG PO TABS
80.0000 mg | ORAL_TABLET | Freq: Every day | ORAL | Status: DC
  Administered 2017-03-30 – 2017-04-02 (×4): 80 mg via ORAL
  Filled 2017-03-30 (×4): qty 1

## 2017-03-30 MED ORDER — METOPROLOL TARTRATE 100 MG PO TABS
100.0000 mg | ORAL_TABLET | Freq: Two times a day (BID) | ORAL | Status: DC
  Administered 2017-03-31 – 2017-04-03 (×7): 100 mg via ORAL
  Filled 2017-03-30 (×8): qty 1

## 2017-03-30 MED ORDER — HYDRALAZINE HCL 20 MG/ML IJ SOLN
10.0000 mg | INTRAMUSCULAR | Status: DC | PRN
  Administered 2017-03-31: 10 mg via INTRAVENOUS
  Filled 2017-03-30: qty 1

## 2017-03-30 MED ORDER — ONDANSETRON HCL 4 MG/2ML IJ SOLN
4.0000 mg | Freq: Once | INTRAMUSCULAR | Status: AC
  Administered 2017-03-30: 4 mg via INTRAVENOUS
  Filled 2017-03-30: qty 2

## 2017-03-30 MED ORDER — POLYETHYLENE GLYCOL 3350 17 G PO PACK: 17.0000 g | PACK | Freq: Every day | ORAL | Status: DC | PRN

## 2017-03-30 MED ORDER — HEPARIN SODIUM (PORCINE) 5000 UNIT/ML IJ SOLN
5000.0000 [IU] | Freq: Three times a day (TID) | INTRAMUSCULAR | Status: DC
  Administered 2017-03-30: 5000 [IU] via SUBCUTANEOUS
  Filled 2017-03-30: qty 1

## 2017-03-30 MED ORDER — FENTANYL CITRATE (PF) 100 MCG/2ML IJ SOLN
50.0000 ug | INTRAMUSCULAR | Status: DC | PRN
  Administered 2017-03-30 – 2017-03-31 (×5): 50 ug via INTRAVENOUS
  Filled 2017-03-30 (×5): qty 2

## 2017-03-30 MED ORDER — ONDANSETRON HCL 4 MG/2ML IJ SOLN
4.0000 mg | Freq: Three times a day (TID) | INTRAMUSCULAR | Status: DC | PRN
  Administered 2017-03-31 (×2): 4 mg via INTRAVENOUS
  Filled 2017-03-30: qty 2

## 2017-03-30 MED ORDER — SODIUM CHLORIDE 0.9 % IV SOLN
INTRAVENOUS | Status: DC
  Administered 2017-03-30 – 2017-04-01 (×3): via INTRAVENOUS

## 2017-03-30 MED ORDER — CITALOPRAM HYDROBROMIDE 40 MG PO TABS
40.0000 mg | ORAL_TABLET | Freq: Every day | ORAL | Status: DC
  Administered 2017-04-01: 40 mg via ORAL
  Filled 2017-03-30: qty 1

## 2017-03-30 MED ORDER — SODIUM CHLORIDE 0.9 % IV BOLUS (SEPSIS)
500.0000 mL | Freq: Once | INTRAVENOUS | Status: AC
  Administered 2017-03-30: 500 mL via INTRAVENOUS

## 2017-03-30 MED ORDER — DIAZEPAM 5 MG PO TABS
10.0000 mg | ORAL_TABLET | Freq: Two times a day (BID) | ORAL | Status: DC | PRN
  Administered 2017-03-31 – 2017-04-03 (×3): 10 mg via ORAL
  Filled 2017-03-30 (×3): qty 2

## 2017-03-30 MED ORDER — DIAZEPAM 5 MG PO TABS
5.0000 mg | ORAL_TABLET | Freq: Two times a day (BID) | ORAL | Status: DC | PRN
  Administered 2017-03-30: 5 mg via ORAL
  Filled 2017-03-30: qty 1

## 2017-03-30 MED ORDER — BISACODYL 10 MG RE SUPP: 10.0000 mg | Freq: Every day | RECTAL | Status: DC | PRN

## 2017-03-30 MED ORDER — ALLOPURINOL 100 MG PO TABS
100.0000 mg | ORAL_TABLET | Freq: Every day | ORAL | Status: DC
  Administered 2017-04-01 – 2017-04-03 (×3): 100 mg via ORAL
  Filled 2017-03-30 (×3): qty 1

## 2017-03-30 MED ORDER — PANTOPRAZOLE SODIUM 40 MG PO TBEC
80.0000 mg | DELAYED_RELEASE_TABLET | Freq: Every day | ORAL | Status: DC
  Administered 2017-04-01 – 2017-04-02 (×2): 80 mg via ORAL
  Filled 2017-03-30 (×2): qty 2

## 2017-03-30 MED ORDER — VITAMIN D 1000 UNITS PO TABS
1000.0000 [IU] | ORAL_TABLET | Freq: Every day | ORAL | Status: DC
  Administered 2017-04-01 – 2017-04-03 (×3): 1000 [IU] via ORAL
  Filled 2017-03-30 (×3): qty 1

## 2017-03-30 MED ORDER — FENTANYL CITRATE (PF) 100 MCG/2ML IJ SOLN
50.0000 ug | Freq: Once | INTRAMUSCULAR | Status: AC
  Administered 2017-03-30: 50 ug via INTRAVENOUS
  Filled 2017-03-30: qty 2

## 2017-03-30 NOTE — ED Triage Notes (Signed)
Pt states that she fell last night at 10pm, stayed on floor until 10am when daughter came to pick her up off floor. C/o severe right knee pain, abrasion to right elbow, pain to flank area. Also c/o of shortness of breath-- hx treated for URI recently

## 2017-03-30 NOTE — Progress Notes (Signed)
Orthopedic Tech Progress Note Patient Details:  Heather [name] 03/07/1951 [id number]  Ortho Devices Type of Ortho Device: Knee Immobilizer Ortho Device/Splint Interventions: Application   [place] Pink 03/30/2017, 6:05 PM

## 2017-03-30 NOTE — ED Notes (Signed)
Attempted IV X2 left wrist, left forearm. Unsuccessful X2

## 2017-03-30 NOTE — H&P (Signed)
History and Physical   Heather [name] WNU:[id number] DOB: [birth year] DOA: 03/30/2017  Referring MD/NP/PA: Lenor Derrick EDP PCP: [name], MD  Patient coming from: Home  Chief Complaint: Fall, right leg pain  HPI: Heather [name] is a 66 y.o. female with a history of poorly-controlled DM, HTN, HLD, and stage III CKD presenting after a slip and fall at home.   She believes she slipped on water on the kitchen floor last night around 10pm and remained on the ground until about 8am this morning when she was found by her daughter in law. She was unable to bear weight on the right leg, and reported severe, constant, aching right knee pain that radiated up the leg. Took nothing for the pain. She was carried to a private vehicle to present to the ED. She had recently been treated for URI with antibiotics but reports lingering cough and her DIL noticed wheezing this morning.  ED Course: She reported right elbow and knee/leg pain, but was in no distress. Temp 99.40F, BP 123/59, HR 86, RR 18, 97% room air.  Labs showed creatinine elevated from baseline at 2.49, hyperglycemia to 399 without anion gap. CK normal at 176. Hemoglobin 10, with mild leukocytosis 12.9k. Imaging demonstrated a comminuted distal right femur fracture. Orthopedics was consulted and [facility] called to admit.   Review of Systems: She currently denies chest pain, dyspnea, leg swelling, orthopnea and fever, and per HPI. All others reviewed and are negative.   Past Medical History:  Diagnosis Date  . B12 deficiency   . Cellulitis and abscess    Abdomen and buttocks  . CKD (chronic kidney disease) stage 3, GFR 30-59 ml/min   . Depression   . Diabetic neuropathy ([place])   . DJD (degenerative joint disease)    Right forminal stenosis C4-5  . Essential hypertension   . Folliculitis   . Gout   . Headache(784.0)   . Hiatal hernia   . History of bronchitis   . History of cardiac catheterization    No significant CAD 2012  . Insomnia    . Irritable bowel syndrome   . Mixed hyperlipidemia   . Osteoarthritis   . Palpitations   . Reflux esophagitis   . Tinnitus    Right  . Type 2 diabetes mellitus [facility])    Past Surgical History:  Procedure Laterality Date  . c-spine surgery     03/2007  . CARDIAC CATHETERIZATION    . CHOLECYSTECTOMY    . COLONOSCOPY  04/21/2011; 05/29/11   6/12: [place] - ?AVM at IC valve, inflammatory changes at [facility] valve [facility]); 7/12 - [place]; IC valve erosions, look chronic and probably Crohn's per path  . ESOPHAGOGASTRODUODENOSCOPY  05/29/11   normal  . ESOPHAGOGASTRODUODENOSCOPY N/A 01/21/2016   Procedure: ESOPHAGOGASTRODUODENOSCOPY (EGD);  Surgeon: Wilford Corner, MD;  Location: [facility] ENDOSCOPY;  Service: Endoscopy;  Laterality: N/A;  . JOINT REPLACEMENT    . Right knee arthroscopic surgery    . TOTAL ABDOMINAL HYSTERECTOMY     - Nonsmoker, no EtOH or drugs. Lives in [place], [place]. Allergies  Allergen Reactions  . Duloxetine Hcl Swelling  . Fluocinolone Swelling  . Acetaminophen Itching and Swelling    Pt states "some swelling"  . Amlodipine Besylate Swelling    Swelling of feet, fluid retention  . Ciprofloxacin Nausea And Vomiting and Swelling    Swelling of face, jaw, and lips  . Hydrocodone-Acetaminophen Itching  . Metformin Other (See Comments)    REACTION: GI UPSET  . Nsaids  Other (See Comments)    "HAS BLEEDING ULCERS"  . Quinolones Swelling  . Sulfamethoxazole Swelling    Swelling of feet, legs  . Sulfonamide Derivatives Swelling  . Valsartan Swelling  . Cozaar [Losartan] Swelling and Rash  . Lisinopril Swelling and Rash    Oral swelling, and red streaks on arms/stomach   Family History  Problem Relation Age of Onset  . Diabetes Mother   . Colon cancer Neg Hx    - Family history otherwise reviewed and not pertinent.  Prior to Admission medications   Medication Sig Start Date End Date Taking? Authorizing Provider  allopurinol (ZYLOPRIM) 100 MG tablet Take 100  mg by mouth daily.   Yes [provider]  alosetron (LOTRONEX) 0.5 MG tablet Take 0.5 mg by mouth 2 (two) times daily.   Yes [provider]  aspirin EC 81 MG tablet Take 81 mg by mouth daily.   Yes [provider]  atorvastatin (LIPITOR) 80 MG tablet Take 80 mg by mouth daily.   Yes [provider]  cholecalciferol (VITAMIN D) 1000 units tablet Take 1,000 Units by mouth daily.   Yes [provider]  citalopram (CELEXA) 40 MG tablet Take 40 mg by mouth daily.    Yes [provider]  cyanocobalamin (,VITAMIN B-12,) 1000 MCG/ML injection Inject 1,000 mcg into the muscle every 30 (thirty) days.     Yes [provider]  diazepam (VALIUM) 5 MG tablet Take 5 mg by mouth 2 (two) times daily as needed for anxiety.   Yes [provider]  diphenoxylate-atropine (LOMOTIL) 2.5-0.025 MG tablet Take 2 tablets by mouth 4 (four) times daily as needed for diarrhea or loose stools. 08/16/16  Yes Pollina, Gwenyth Allegra, MD  esomeprazole (NEXIUM) 40 MG capsule Take 40 mg by mouth daily before breakfast.     Yes [provider]  fenofibrate (TRICOR) 145 MG tablet Take 1 tablet (145 mg total) by mouth daily. 03/08/17  Yes Gatha Mayer, MD  furosemide (LASIX) 40 MG tablet Take 1 tablet (40 mg total) by mouth daily. 01/26/16  Yes Reyne Dumas, MD  glucose blood (ONE TOUCH TEST STRIPS) test strip Use as instructed 4 x daily. E11.65 11/30/16  Yes Nida, Marella Chimes, MD  Insulin Detemir (LEVEMIR FLEXTOUCH) 100 UNIT/ML Pen Inject 90 Units into the skin at bedtime. Patient taking differently: Inject 80 Units into the skin at bedtime.  12/26/16  Yes Elgergawy, Silver Huguenin, MD  Insulin Lispro (HUMALOG KWIKPEN [place]) Inject into the skin 3 (three) times daily with meals. Sliding scale   Yes [provider]  liraglutide (VICTOZA) 18 MG/3ML SOPN USE 1.8 MG EVERY DAY AS DIRECTED 03/07/17  Yes Nida, Marella Chimes, MD  metoCLOPramide (REGLAN) 10 MG  tablet Take 1 tablet (10 mg total) by mouth 3 (three) times daily before meals. 01/21/17  Yes Gatha Mayer, MD  metoprolol (LOPRESSOR) 100 MG tablet Take 100 mg by mouth 2 (two) times daily.    Yes [provider]  omega-3 acid ethyl esters (LOVAZA) 1 g capsule Take 1 capsule (1 g total) by mouth 2 (two) times daily. 12/26/16  Yes Elgergawy, Silver Huguenin, MD  potassium chloride SA (K-DUR,KLOR-CON) 20 MEQ tablet Take 20 mEq by mouth daily.   Yes [provider]  tiZANidine (ZANAFLEX) 4 MG tablet Take 8 mg by mouth at bedtime.   Yes [provider]  alosetron (LOTRONEX) 1 MG tablet Take 1 tablet (1 mg total) by mouth 2 (two) times daily. Patient  not taking: Reported on 03/30/2017 03/08/17   Gatha Mayer, MD  blood glucose meter kit and supplies KIT Dispense based on patient and insurance preference. Use up to four times daily as directed. (FOR ICD-10 E11.65) 01/04/17   Cassandria Anger, MD  Blood Glucose Monitoring Suppl (ONETOUCH VERIO) w/Device KIT 1 each by Does not apply route as needed. Test 4 times daily 11/30/16   Cassandria Anger, MD    Physical Exam: Vitals:   03/30/17 1654 03/30/17 1700 03/30/17 1730 03/30/17 1745  BP: (!) 166/55 (!) 151/61 (!) 162/64 (!) 126/50  Pulse: 88 82 83 78  Resp:      Temp:      TempSrc:      SpO2: 100% 98% 100% 97%  Weight:      Height:       Constitutional: 66 y.o. female in no distress, calm demeanor Eyes: Lids and conjunctivae normal, PERRL ENMT: Mucous membranes are moist. Posterior pharynx clear of any exudate or lesions. Fair dentition.  Neck: normal, supple, no masses, no thyromegaly Respiratory: Non-labored breathing without accessory muscle use. Clear breath sounds to auscultation bilaterally Cardiovascular: Regular rate and rhythm, no murmurs, rubs, or gallops. No carotid bruits. No JVD. Abdomen: Normoactive bowel sounds. No tenderness, non-distended, and no masses palpated. No hepatosplenomegaly. GU: No  indwelling catheter Musculoskeletal: No clubbing / cyanosis. Right knee with longitudinal surgical scar, effusion. ROM limited by pain. Distally SILT, full strength, cap refill brisk. Compartments soft. Skin: Warm, dry. Superficial skin avulsion on dorsal/lateral right elbow, hemostatic. !1.5cm erythematous macules on left lateral thigh and right superior posterior leg without induration, fluctuance, discharge.  Neurologic: CN II-XII grossly intact. Gait not assessed. Speech normal. No focal deficits in motor strength or sensation in all extremities.  Psychiatric: Alert and oriented x3. Normal judgment and insight. Mood euthymic with congruent affect.   Labs on Admission: I have personally reviewed following labs and imaging studies  CBC:  Recent Labs Lab 03/30/17 1313  WBC 12.9*  NEUTROABS 10.2*  HGB 10.0*  HCT 31.0*  MCV 86.6  PLT 076   Basic Metabolic Panel:  Recent Labs Lab 03/30/17 1313  NA 131*  K 3.8  CL 98*  CO2 20*  GLUCOSE 399*  BUN 37*  CREATININE 2.49*  CALCIUM 9.3   GFR: Estimated Creatinine Clearance: 21.5 mL/min (A) (by C-G formula based on SCr of 2.49 mg/dL (H)).  Cardiac Enzymes:  Recent Labs Lab 03/30/17 1313  CKTOTAL 176   Radiological Exams on Admission: Dg Elbow Complete Right  Result Date: 03/30/2017 CLINICAL DATA:  Fall.  Pain. EXAM: RIGHT ELBOW - COMPLETE 3+ VIEW COMPARISON:  Although images are unavailable, the report for a study from 01/13/2014 has been reviewed. FINDINGS: Anterior fat pad slightly prominent but not frankly elevated. Nonvisualization posterior fat pad. No evidence for an acute fracture. No evidence for subluxation or dislocation. No worrisome lytic or sclerotic osseous abnormality. IMPRESSION: Negative. Electronically Signed   By: Misty Stanley M.D.   On: 03/30/2017 15:19   Dg Knee Complete 4 Views Right  Result Date: 03/30/2017 CLINICAL DATA:  Patient fell.  Pain. EXAM: RIGHT KNEE - COMPLETE 4+ VIEW COMPARISON:  None.  FINDINGS: Status post tricompartmental knee replacement. Comminuted fracture of the distal femoral metaphysis is identified with mild apex anterior angulation. Bones are diffusely demineralized. IMPRESSION: Comminuted fracture distal femoral metaphysis in this patient status post tricompartmental knee replacement. Electronically Signed   By: Misty Stanley M.D.   On: 03/30/2017 15:17   Dg  Hip Unilat W Or Wo Pelvis 2-3 Views Right  Result Date: 03/30/2017 CLINICAL DATA:  Acute right hip pain following fall last night. Initial encounter. EXAM: DG HIP (WITH OR WITHOUT PELVIS) 2-3V RIGHT COMPARISON:  01/29/2014 FINDINGS: There is no evidence of acute fracture subluxation. Mild degenerative changes in the hips again noted. No focal bony lesions are present. IMPRESSION: No acute abnormality. Electronically Signed   By: Margarette Canada M.D.   On: 03/30/2017 15:20    EKG: Ordered.  Assessment/Plan Principal Problem:   Femur fracture, right (HCC) Active Problems:   Irritable bowel syndrome   Essential hypertension   Diabetes mellitus with chronic kidney disease (HCC)   Depression   Acute renal failure superimposed on stage 3 chronic kidney disease (HCC)   Closed comminuted distal right femur fracture:  - Knee immobilizer as tolerates - Analgesia: morphine 22m IV q4h prn - Dr. OAlvan Dameto see patient, anticipate operative management 6/3. NPO p MN - Pt has acceptable risk for perioperative cardiac mortality. Has no angina, fair functional status, no symptoms or history of heart failure. Pt had recent echo w/preserved EF, no RWMA's. Moderate TR. Myoview 2014 was low risk. Ordered pre-op ECG. Denies complications with general anesthesia in the past. - Heparin for DVT ppx, or per orthopedics. Holding ASA.  IDDM: Poorly-controlled with last HbA1c 8.9%.  - Will order 1/2 home dose levemir tonight, given NPO p MN (down to 40 units)  - q4h resistant SSI (NPO), takes sliding scale TID 20-26 units at home - Hold  victoza  AKI on stage III CKD: Suspect dehydration due to being down. CK not indicative of rhabdomyolysis. - Gentle hydration overnight - Repeat in AM - Consider further work up if not improving - Renally dose medications - Hold home lasix and potassium.   Normocytic anemia: Hgb 10 at admission, below most recent value, though historic baseline is below normal limits. Has history of bleeding peptic ulcer requiring transfusions.  - No transfusion indicated at this time - Monitor CBC daily.  - Continue PPI.   Hypertension: BP elevated, likely due to pain.  - Continue home metoprolol  - Hydralazine IV prn severe-range HTN  Neutrophilic leukocytosis:  - Suspect reactive, monitor CBC in AM.  Hyperlipidemia:  - Continue statin. Hold fenofibrate with renal impairment.  IBS: Chronic, stable - Continue home medications following surgery.  Gout: Chronic, stable. No flare - Continue allopurinol  Anxiety/depression:  - Continue home medications, valium, celexa.   DVT prophylaxis: Heparin  Code Status: Full code confirmed at admission, but would like to return to DNR status after recovery from surgery/anesthesia.   Family Communication: Husband and DIL in room Disposition Plan: Admit to inpatient, unsure of post-hospital disposition at this time Consults called: Orthopedics by EDP  Admission status: Inpatient    RVance Gather MD [facility] Pager [phone] If 7PM-7AM, please contact night-coverage [email or web address] Password [email or web address]6/11/2016, 6:02 PM

## 2017-03-30 NOTE — ED Provider Notes (Signed)
[facility] DEPT Provider Note   CSN: [id number] Arrival date & time: 03/30/17  1211     History   Chief Complaint Chief Complaint  Patient presents with  . Fall    HPI Heather [name] is a 66 y.o. female.  Patient is a 66 year old female who presents after a fall with right-sided pain. She states that she was walking in the kitchen to get some water and feels like she might have spilled some of the water on the floor because she slipped on something on the floor and fell over onto her right side. This happened about 10:00 last night and she laid on the floor until about 8:00 this morning when she was able to crawl to another room and call for help. She complains of constant worsening pain to her right knee and right hip. She denies any neck or back pain. She denies hitting her head on the floor. She has some abrasion and pain to her right elbow and right hand. She states her tetanus shot is up-to-date. She is not on anticoagulants. She denies any other injuries from the fall. She is having some nausea related to the ongoing pain. She denies any chest pain or shortness of breath.      Past Medical History:  Diagnosis Date  . B12 deficiency   . Cellulitis and abscess    Abdomen and buttocks  . CKD (chronic kidney disease) stage 3, GFR 30-59 ml/min   . Depression   . Diabetic neuropathy ([place])   . DJD (degenerative joint disease)    Right forminal stenosis C4-5  . Essential hypertension   . Folliculitis   . Gout   . Headache(784.0)   . Hiatal hernia   . History of bronchitis   . History of cardiac catheterization    No significant CAD 2012  . Insomnia   . Irritable bowel syndrome   . Mixed hyperlipidemia   . Osteoarthritis   . Palpitations   . Reflux esophagitis   . Tinnitus    Right  . Type 2 diabetes mellitus [facility])     Patient Active Problem List   Diagnosis Date Noted  . Acute renal failure superimposed on stage 3 chronic kidney disease ([place]) 01/21/2016    . Faintness 04/29/2015  . Awareness alteration, transient 03/02/2015  . Neuropathy 03/02/2015  . Pain in the chest   . Chest pain 12/22/2014  . Essential hypertension 12/22/2014  . Nausea & vomiting 12/22/2014  . Diabetes mellitus with chronic kidney disease ([place]) 12/22/2014  . Hiatal hernia 12/22/2014  . GERD (gastroesophageal reflux disease) 12/22/2014  . Gout 12/22/2014  . Mixed hyperlipidemia 12/22/2014  . Depression 12/22/2014  . Chronic kidney disease, stage III (moderate) 08/25/2014  . Vitamin B12 deficiency 07/08/2011  . Personal history of failed moderate sedation 04/16/2011  . Chronic Nausea and Vomiting - ? gastroapresis 04/16/2011  . Early satiety 04/16/2011  . Irritable bowel syndrome 04/16/2011  . Benign paroxysmal positional vertigo 11/20/2010  . TRICUSPID REGURGITATION, MODERATE 11/20/2010  . PULMONARY HYPERTENSION, MILD 11/20/2010  . LEG EDEMA, BILATERAL 11/20/2010  . DYSPNEA 11/20/2010  . NONSPECIFIC ABNORMAL ELECTROCARDIOGRAM 11/20/2010  . CHEST WALL PAIN, HX OF 11/20/2010    Past Surgical History:  Procedure Laterality Date  . c-spine surgery     03/2007  . CARDIAC CATHETERIZATION    . CHOLECYSTECTOMY    . COLONOSCOPY  04/21/2011; 05/29/11   6/12: [place] - ?AVM at IC valve, inflammatory changes at [facility] valve [facility]); 7/12 - [facility];  IC valve erosions, look chronic and probably Crohn's per path  . ESOPHAGOGASTRODUODENOSCOPY  05/29/11   normal  . ESOPHAGOGASTRODUODENOSCOPY N/A 01/21/2016   Procedure: ESOPHAGOGASTRODUODENOSCOPY (EGD);  Surgeon: Wilford Corner, MD;  Location: [facility] ENDOSCOPY;  Service: Endoscopy;  Laterality: N/A;  . JOINT REPLACEMENT    . Right knee arthroscopic surgery    . TOTAL ABDOMINAL HYSTERECTOMY      OB History    No data available       Home Medications    Prior to Admission medications   Medication Sig Start Date End Date Taking? Authorizing Provider  allopurinol (ZYLOPRIM) 100 MG tablet Take 100 mg by mouth  daily.   Yes [provider]  alosetron (LOTRONEX) 0.5 MG tablet Take 0.5 mg by mouth 2 (two) times daily.   Yes [provider]  aspirin EC 81 MG tablet Take 81 mg by mouth daily.   Yes [provider]  atorvastatin (LIPITOR) 80 MG tablet Take 80 mg by mouth daily.   Yes [provider]  cholecalciferol (VITAMIN D) 1000 units tablet Take 1,000 Units by mouth daily.   Yes [provider]  citalopram (CELEXA) 40 MG tablet Take 40 mg by mouth daily.    Yes [provider]  cyanocobalamin (,VITAMIN B-12,) 1000 MCG/ML injection Inject 1,000 mcg into the muscle every 30 (thirty) days.     Yes [provider]  diazepam (VALIUM) 5 MG tablet Take 5 mg by mouth 2 (two) times daily as needed for anxiety.   Yes [provider]  diphenoxylate-atropine (LOMOTIL) 2.5-0.025 MG tablet Take 2 tablets by mouth 4 (four) times daily as needed for diarrhea or loose stools. 08/16/16  Yes Pollina, Gwenyth Allegra, MD  esomeprazole (NEXIUM) 40 MG capsule Take 40 mg by mouth daily before breakfast.     Yes [provider]  fenofibrate (TRICOR) 145 MG tablet Take 1 tablet (145 mg total) by mouth daily. 03/08/17  Yes Gatha Mayer, MD  furosemide (LASIX) 40 MG tablet Take 1 tablet (40 mg total) by mouth daily. 01/26/16  Yes Reyne Dumas, MD  glucose blood (ONE TOUCH TEST STRIPS) test strip Use as instructed 4 x daily. E11.65 11/30/16  Yes [facility], Marella Chimes, MD  Insulin Detemir (LEVEMIR FLEXTOUCH) 100 UNIT/ML Pen Inject 90 Units into the skin at bedtime. Patient taking differently: Inject 80 Units into the skin at bedtime.  12/26/16  Yes Elgergawy, Silver Huguenin, MD  Insulin Lispro (HUMALOG KWIKPEN [place]) Inject into the skin 3 (three) times daily with meals. Sliding scale   Yes [provider]  liraglutide (VICTOZA) 18 MG/3ML SOPN USE 1.8 MG EVERY DAY AS DIRECTED 03/07/17  Yes [facility], Marella Chimes, MD  metoCLOPramide (REGLAN) 10 MG tablet Take 1  tablet (10 mg total) by mouth 3 (three) times daily before meals. 01/21/17  Yes Gatha Mayer, MD  metoprolol (LOPRESSOR) 100 MG tablet Take 100 mg by mouth 2 (two) times daily.    Yes [provider]  omega-3 acid ethyl esters (LOVAZA) 1 g capsule Take 1 capsule (1 g total) by mouth 2 (two) times daily. 12/26/16  Yes Elgergawy, Silver Huguenin, MD  potassium chloride SA (K-DUR,KLOR-CON) 20 MEQ tablet Take 20 mEq by mouth daily.   Yes [provider]  tiZANidine (ZANAFLEX) 4 MG tablet Take 8 mg by mouth at bedtime.   Yes [provider]  alosetron (LOTRONEX) 1 MG tablet Take 1 tablet (1 mg total) by mouth 2 (two) times daily. Patient not taking: Reported  on 03/30/2017 03/08/17   Gatha Mayer, MD  blood glucose meter kit and supplies KIT Dispense based on patient and insurance preference. Use up to four times daily as directed. (FOR ICD-10 E11.65) 01/04/17   Cassandria Anger, MD  Blood Glucose Monitoring Suppl (ONETOUCH VERIO) w/Device KIT 1 each by Does not apply route as needed. Test 4 times daily 11/30/16   Cassandria Anger, MD    Family History Family History  Problem Relation Age of Onset  . Diabetes Mother   . Colon cancer Neg Hx     Social History Social History  Substance Use Topics  . Smoking status: Never Smoker  . Smokeless tobacco: Never Used  . Alcohol use No     Allergies   Duloxetine hcl; Fluocinolone; Acetaminophen; Amlodipine besylate; Ciprofloxacin; Hydrocodone-acetaminophen; Metformin; Nsaids; Quinolones; Sulfamethoxazole; Sulfonamide derivatives; Valsartan; Cozaar [losartan]; and Lisinopril   Review of Systems Review of Systems  Constitutional: Negative for activity change, appetite change and fever.  HENT: Negative for dental problem, nosebleeds and trouble swallowing.   Eyes: Negative for pain and visual disturbance.  Respiratory: Negative for shortness of breath.   Cardiovascular: Negative for chest pain.  Gastrointestinal:  Positive for nausea. Negative for abdominal pain and vomiting.  Genitourinary: Negative for dysuria and hematuria.  Musculoskeletal: Positive for arthralgias and joint swelling. Negative for back pain and neck pain.  Skin: Positive for wound.  Neurological: Negative for weakness, numbness and headaches.  Psychiatric/Behavioral: Negative for confusion.     Physical Exam Updated Vital Signs BP (!) 162/49   Pulse 87   Temp 99.1 F (37.3 C) (Oral)   Resp 18   Ht '5\' 2"'  (1.575 m)   Wt 74.4 kg (164 lb)   SpO2 100%   BMI 30.00 kg/m   Physical Exam  Constitutional: She is oriented to person, place, and time. She appears well-developed and well-nourished.  HENT:  Head: Normocephalic and atraumatic.  Nose: Nose normal.  No hemotympanum  Eyes: Conjunctivae are normal. Pupils are equal, round, and reactive to light.  Neck:  No pain to the cervical, thoracic, or LS spine.  No step-offs or deformities noted  Cardiovascular: Normal rate and regular rhythm.   No murmur heard. No evidence of external trauma to the chest or abdomen  Pulmonary/Chest: Effort normal and breath sounds normal. No respiratory distress. She has no wheezes. She exhibits no tenderness.  Abdominal: Soft. Bowel sounds are normal. She exhibits no distension. There is no tenderness.  Musculoskeletal: Normal range of motion.  Patient has swelling with a joint effusion and pain to her right knee. There is limited mobility in the knee due to pain. She also has pain on palpation and range of motion of the right hip. She has normal pulses and sensation in the foot. No pain to the ankle or the foot. She does have some tenderness to the right olecranon with an abrasion overlying the right elbow. There are no suturable wounds. There is an abrasion on the palmar surface of the right hand but there is no underlying bony tenderness to the hand or wrist. No pain on palpation or ROM of the other extremities  Neurological: She is alert and  oriented to person, place, and time.  Skin: Skin is warm and dry. Capillary refill takes less than 2 seconds.  Psychiatric: She has a normal mood and affect.  Vitals reviewed.    ED Treatments / Results  Labs (all labs ordered are listed, but only abnormal results are displayed) Labs  Reviewed  BASIC METABOLIC PANEL - Abnormal; Notable for the following:       Result Value   Sodium 131 (*)    Chloride 98 (*)    CO2 20 (*)    Glucose, Bld 399 (*)    BUN 37 (*)    Creatinine, Ser 2.49 (*)    GFR calc non Af Amer 19 (*)    GFR calc Af Amer 22 (*)    All other components within normal limits  CBC WITH DIFFERENTIAL/PLATELET - Abnormal; Notable for the following:    WBC 12.9 (*)    RBC 3.58 (*)    Hemoglobin 10.0 (*)    HCT 31.0 (*)    Neutro Abs 10.2 (*)    Monocytes Absolute 1.2 (*)    All other components within normal limits  CK    EKG  EKG Interpretation None       Radiology Dg Elbow Complete Right  Result Date: 03/30/2017 CLINICAL DATA:  Fall.  Pain. EXAM: RIGHT ELBOW - COMPLETE 3+ VIEW COMPARISON:  Although images are unavailable, the report for a study from 01/13/2014 has been reviewed. FINDINGS: Anterior fat pad slightly prominent but not frankly elevated. Nonvisualization posterior fat pad. No evidence for an acute fracture. No evidence for subluxation or dislocation. No worrisome lytic or sclerotic osseous abnormality. IMPRESSION: Negative. Electronically Signed   By: Misty Stanley M.D.   On: 03/30/2017 15:19   Dg Knee Complete 4 Views Right  Result Date: 03/30/2017 CLINICAL DATA:  Patient fell.  Pain. EXAM: RIGHT KNEE - COMPLETE 4+ VIEW COMPARISON:  None. FINDINGS: Status post tricompartmental knee replacement. Comminuted fracture of the distal femoral metaphysis is identified with mild apex anterior angulation. Bones are diffusely demineralized. IMPRESSION: Comminuted fracture distal femoral metaphysis in this patient status post tricompartmental knee replacement.  Electronically Signed   By: Misty Stanley M.D.   On: 03/30/2017 15:17   Dg Hip Unilat W Or Wo Pelvis 2-3 Views Right  Result Date: 03/30/2017 CLINICAL DATA:  Acute right hip pain following fall last night. Initial encounter. EXAM: DG HIP (WITH OR WITHOUT PELVIS) 2-3V RIGHT COMPARISON:  01/29/2014 FINDINGS: There is no evidence of acute fracture subluxation. Mild degenerative changes in the hips again noted. No focal bony lesions are present. IMPRESSION: No acute abnormality. Electronically Signed   By: Margarette Canada M.D.   On: 03/30/2017 15:20    Procedures Procedures (including critical care time)  Medications Ordered in ED Medications  sodium chloride 0.9 % bolus 500 mL (0 mLs Intravenous Stopped 03/30/17 1518)  fentaNYL (SUBLIMAZE) injection 50 mcg (50 mcg Intravenous Given 03/30/17 1418)  ondansetron (ZOFRAN) injection 4 mg (4 mg Intravenous Given 03/30/17 1419)  sodium chloride 0.9 % bolus 1,000 mL (0 mLs Intravenous Stopped 03/30/17 1611)  morphine 4 MG/ML injection 4 mg (4 mg Intravenous Given 03/30/17 1608)  ondansetron (ZOFRAN) injection 4 mg (4 mg Intravenous Given 03/30/17 1608)     Initial Impression / Assessment and Plan / ED Course  I have reviewed the triage vital signs and the nursing notes.  Pertinent labs & imaging results that were available during my care of the patient were reviewed by me and considered in my medical decision making (see chart for details).     Patient has a distal femur fracture. There is no evidence of traumatic injuries. I spoke with Dr. Alvan Dame with orthopedics who is planning on doing surgery in the morning. He advises to make the patient nothing by mouth after midnight. I  spoke with Dr. Bonner Puna with the hospitalist service who will admit the patient for unassigned medicine. We will attempt to get the patient had knee immobilizer if she will tolerate this. Her creatinine is elevated but there is no evidence of rhabdomyolysis. Her tetanus shot is up-to-date.  Final  Clinical Impressions(s) / ED Diagnoses   Final diagnoses:  Fall, initial encounter  Closed fracture of distal end of femur, unspecified fracture morphology, initial encounter [facility])    New Prescriptions New Prescriptions   No medications on file     Malvin Johns, MD 03/30/17 1656

## 2017-03-31 ENCOUNTER — Inpatient Hospital Stay (HOSPITAL_COMMUNITY): Payer: Medicare Other | Admitting: Certified Registered"

## 2017-03-31 ENCOUNTER — Inpatient Hospital Stay (HOSPITAL_COMMUNITY): Payer: Medicare Other

## 2017-03-31 ENCOUNTER — Encounter (HOSPITAL_COMMUNITY): Payer: Self-pay | Admitting: Surgery

## 2017-03-31 ENCOUNTER — Encounter (HOSPITAL_COMMUNITY)
Admission: EM | Disposition: A | Discharge status: Home / Self Care | Payer: Self-pay | Source: Home / Self Care | Attending: Internal Medicine

## 2017-03-31 DIAGNOSIS — K210 Gastro-esophageal reflux disease with esophagitis: Secondary | ICD-10-CM

## 2017-03-31 DIAGNOSIS — F329 Major depressive disorder, single episode, unspecified: Secondary | ICD-10-CM

## 2017-03-31 DIAGNOSIS — S72001D Fracture of unspecified part of neck of right femur, subsequent encounter for closed fracture with routine healing: Secondary | ICD-10-CM

## 2017-03-31 DIAGNOSIS — E782 Mixed hyperlipidemia: Secondary | ICD-10-CM

## 2017-03-31 DIAGNOSIS — S72009A Fracture of unspecified part of neck of unspecified femur, initial encounter for closed fracture: Secondary | ICD-10-CM

## 2017-03-31 HISTORY — PX: ORIF FEMUR FRACTURE: SHX2119

## 2017-03-31 LAB — GLUCOSE, CAPILLARY
GLUCOSE-CAPILLARY: 219 mg/dL — AB (ref 65–99)
GLUCOSE-CAPILLARY: 157 mg/dL — AB (ref 65–99)
GLUCOSE-CAPILLARY: 259 mg/dL — AB (ref 65–99)
Glucose-Capillary: 115 mg/dL — ABNORMAL HIGH (ref 65–99)
Glucose-Capillary: 246 mg/dL — ABNORMAL HIGH (ref 65–99)

## 2017-03-31 LAB — URINALYSIS, ROUTINE W REFLEX MICROSCOPIC
Bilirubin Urine: NEGATIVE
Hgb urine dipstick: NEGATIVE
Ketones, ur: NEGATIVE mg/dL
Leukocytes, UA: NEGATIVE
Nitrite: NEGATIVE
PH: 5 (ref 5.0–8.0)
Protein, ur: NEGATIVE mg/dL
SPECIFIC GRAVITY, URINE: 1.011 (ref 1.005–1.030)

## 2017-03-31 LAB — BASIC METABOLIC PANEL
ANION GAP: 12 (ref 5–15)
BUN: 28 mg/dL — ABNORMAL HIGH (ref 6–20)
CALCIUM: 9.1 mg/dL (ref 8.9–10.3)
CHLORIDE: 105 mmol/L (ref 101–111)
CO2: 19 mmol/L — AB (ref 22–32)
Creatinine, Ser: 2.04 mg/dL — ABNORMAL HIGH (ref 0.44–1.00)
GFR calc non Af Amer: 25 mL/min — ABNORMAL LOW (ref >60)
GFR, EST AFRICAN AMERICAN: 29 mL/min — AB (ref >60)
Glucose, Bld: 173 mg/dL — ABNORMAL HIGH (ref 65–99)
POTASSIUM: 4.1 mmol/L (ref 3.5–5.1)
Sodium: 136 mmol/L (ref 135–145)

## 2017-03-31 LAB — CBC
HEMATOCRIT: 33.6 % — AB (ref 36.0–46.0)
HEMOGLOBIN: 10.4 g/dL — AB (ref 12.0–15.0)
MCH: 28.1 pg (ref 26.0–34.0)
MCHC: 31 g/dL (ref 30.0–36.0)
MCV: 90.8 fL (ref 78.0–100.0)
Platelets: 325 10*3/uL (ref 150–400)
RBC: 3.7 MIL/uL — AB (ref 3.87–5.11)
RDW: 15.6 % — ABNORMAL HIGH (ref 11.5–15.5)
WBC: 19.5 10*3/uL — ABNORMAL HIGH (ref 4.0–10.5)

## 2017-03-31 LAB — CREATININE, URINE, RANDOM: CREATININE, URINE: 62.4 mg/dL

## 2017-03-31 LAB — CK: CK TOTAL: 185 U/L (ref 38–234)

## 2017-03-31 LAB — SODIUM, URINE, RANDOM: SODIUM UR: 80 mmol/L

## 2017-03-31 LAB — ALBUMIN: Albumin: 3.4 g/dL — ABNORMAL LOW (ref 3.5–5.0)

## 2017-03-31 SURGERY — OPEN REDUCTION INTERNAL FIXATION (ORIF) DISTAL FEMUR FRACTURE
Anesthesia: General | Site: Leg Upper | Laterality: Right

## 2017-03-31 MED ORDER — DEXTROSE 5 % IV SOLN
500.0000 mg | Freq: Four times a day (QID) | INTRAVENOUS | Status: DC | PRN
  Filled 2017-03-31: qty 5

## 2017-03-31 MED ORDER — MORPHINE SULFATE (PF) 4 MG/ML IV SOLN: 2.0000 mg | INTRAVENOUS | Status: DC | PRN

## 2017-03-31 MED ORDER — ROCURONIUM BROMIDE 100 MG/10ML IV SOLN
INTRAVENOUS | Status: DC | PRN
  Administered 2017-03-31: 40 mg via INTRAVENOUS

## 2017-03-31 MED ORDER — 0.9 % SODIUM CHLORIDE (POUR BTL) OPTIME
TOPICAL | Status: DC | PRN
  Administered 2017-03-31: 1000 mL

## 2017-03-31 MED ORDER — SORBITOL 70 % SOLN: 30.0000 mL | Freq: Every day | Status: DC | PRN

## 2017-03-31 MED ORDER — PROPOFOL 10 MG/ML IV BOLUS
INTRAVENOUS | Status: AC
  Filled 2017-03-31: qty 20

## 2017-03-31 MED ORDER — OXYCODONE HCL 5 MG PO TABS
5.0000 mg | ORAL_TABLET | ORAL | Status: DC | PRN
  Administered 2017-03-31: 10 mg via ORAL
  Filled 2017-03-31: qty 2

## 2017-03-31 MED ORDER — OXYCODONE HCL 5 MG PO TABS
ORAL_TABLET | ORAL | Status: AC
  Administered 2017-03-31: 5 mg via ORAL
  Filled 2017-03-31: qty 1

## 2017-03-31 MED ORDER — MIDAZOLAM HCL 2 MG/2ML IJ SOLN
INTRAMUSCULAR | Status: AC
  Filled 2017-03-31: qty 2

## 2017-03-31 MED ORDER — SODIUM CHLORIDE 0.9 % IV SOLN: INTRAVENOUS | Status: DC

## 2017-03-31 MED ORDER — FENTANYL CITRATE (PF) 100 MCG/2ML IJ SOLN: 25.0000 ug | INTRAMUSCULAR | Status: DC | PRN

## 2017-03-31 MED ORDER — ACETAMINOPHEN 325 MG PO TABS: 650.0000 mg | ORAL_TABLET | Freq: Four times a day (QID) | ORAL | Status: DC | PRN

## 2017-03-31 MED ORDER — ENOXAPARIN SODIUM 40 MG/0.4ML ~~LOC~~ SOLN
40.0000 mg | SUBCUTANEOUS | Status: DC
  Administered 2017-04-01: 40 mg via SUBCUTANEOUS
  Filled 2017-03-31: qty 0.4

## 2017-03-31 MED ORDER — METHOCARBAMOL 1000 MG/10ML IJ SOLN: 500.0000 mg | Freq: Four times a day (QID) | INTRAVENOUS | Status: DC | PRN

## 2017-03-31 MED ORDER — ONDANSETRON HCL 4 MG PO TABS: 4.0000 mg | ORAL_TABLET | Freq: Four times a day (QID) | ORAL | Status: DC | PRN

## 2017-03-31 MED ORDER — FERROUS SULFATE 325 (65 FE) MG PO TABS
325.0000 mg | ORAL_TABLET | Freq: Two times a day (BID) | ORAL | Status: DC
  Administered 2017-03-31 – 2017-04-03 (×6): 325 mg via ORAL
  Filled 2017-03-31 (×6): qty 1

## 2017-03-31 MED ORDER — SUCCINYLCHOLINE CHLORIDE 20 MG/ML IJ SOLN
INTRAMUSCULAR | Status: DC | PRN
  Administered 2017-03-31: 100 mg via INTRAVENOUS

## 2017-03-31 MED ORDER — METHOCARBAMOL 500 MG PO TABS: 500.0000 mg | ORAL_TABLET | Freq: Four times a day (QID) | ORAL | Status: DC | PRN

## 2017-03-31 MED ORDER — METOCLOPRAMIDE HCL 5 MG/ML IJ SOLN: 5.0000 mg | Freq: Three times a day (TID) | INTRAMUSCULAR | Status: DC | PRN

## 2017-03-31 MED ORDER — NEOSTIGMINE METHYLSULFATE 10 MG/10ML IV SOLN
INTRAVENOUS | Status: DC | PRN
  Administered 2017-03-31: 4 mg via INTRAVENOUS

## 2017-03-31 MED ORDER — METHOCARBAMOL 500 MG PO TABS
500.0000 mg | ORAL_TABLET | Freq: Four times a day (QID) | ORAL | Status: DC | PRN
  Administered 2017-04-01 – 2017-04-02 (×3): 500 mg via ORAL
  Filled 2017-03-31 (×4): qty 1

## 2017-03-31 MED ORDER — LACTATED RINGERS IV SOLN
INTRAVENOUS | Status: DC
  Administered 2017-03-31: 10:00:00 via INTRAVENOUS

## 2017-03-31 MED ORDER — OXYCODONE HCL 5 MG PO TABS
5.0000 mg | ORAL_TABLET | ORAL | Status: DC | PRN
Start: 2017-03-31 — End: 2017-04-03
  Administered 2017-03-31: 5 mg via ORAL
  Administered 2017-03-31 – 2017-04-03 (×9): 10 mg via ORAL
  Filled 2017-03-31 (×9): qty 2

## 2017-03-31 MED ORDER — METOCLOPRAMIDE HCL 5 MG PO TABS: 5.0000 mg | ORAL_TABLET | Freq: Three times a day (TID) | ORAL | Status: DC | PRN

## 2017-03-31 MED ORDER — PROPOFOL 10 MG/ML IV BOLUS
INTRAVENOUS | Status: DC | PRN
  Administered 2017-03-31: 150 mg via INTRAVENOUS

## 2017-03-31 MED ORDER — MORPHINE SULFATE (PF) 4 MG/ML IV SOLN: 0.5000 mg | INTRAVENOUS | Status: DC | PRN

## 2017-03-31 MED ORDER — CEFAZOLIN SODIUM-DEXTROSE 2-4 GM/100ML-% IV SOLN
INTRAVENOUS | Status: AC
  Filled 2017-03-31: qty 100

## 2017-03-31 MED ORDER — SENNA 8.6 MG PO TABS
1.0000 | ORAL_TABLET | Freq: Two times a day (BID) | ORAL | Status: DC
  Administered 2017-03-31 – 2017-04-03 (×6): 8.6 mg via ORAL
  Filled 2017-03-31 (×6): qty 1

## 2017-03-31 MED ORDER — GLYCOPYRROLATE 0.2 MG/ML IJ SOLN
INTRAMUSCULAR | Status: DC | PRN
  Administered 2017-03-31: 0.6 mg via INTRAVENOUS

## 2017-03-31 MED ORDER — ACETAMINOPHEN 650 MG RE SUPP: 650.0000 mg | Freq: Four times a day (QID) | RECTAL | Status: DC | PRN

## 2017-03-31 MED ORDER — CEFAZOLIN SODIUM-DEXTROSE 1-4 GM/50ML-% IV SOLN
1.0000 g | Freq: Four times a day (QID) | INTRAVENOUS | Status: AC
  Administered 2017-03-31 – 2017-04-01 (×3): 1 g via INTRAVENOUS
  Filled 2017-03-31 (×3): qty 50

## 2017-03-31 MED ORDER — DEXTROSE 5 % IV SOLN
INTRAVENOUS | Status: DC | PRN
  Administered 2017-03-31: 25 ug/min via INTRAVENOUS

## 2017-03-31 MED ORDER — ONDANSETRON HCL 4 MG/2ML IJ SOLN: 4.0000 mg | Freq: Four times a day (QID) | INTRAMUSCULAR | Status: DC | PRN

## 2017-03-31 MED ORDER — MORPHINE SULFATE (PF) 2 MG/ML IV SOLN: 2.0000 mg | INTRAVENOUS | Status: DC | PRN

## 2017-03-31 MED ORDER — HYDROXYZINE HCL 25 MG PO TABS: 25.0000 mg | ORAL_TABLET | Freq: Three times a day (TID) | ORAL | Status: DC | PRN

## 2017-03-31 MED ORDER — FENTANYL CITRATE (PF) 250 MCG/5ML IJ SOLN
INTRAMUSCULAR | Status: AC
  Filled 2017-03-31: qty 5

## 2017-03-31 MED ORDER — DIPHENHYDRAMINE HCL 25 MG PO CAPS: 25.0000 mg | ORAL_CAPSULE | Freq: Four times a day (QID) | ORAL | Status: DC | PRN

## 2017-03-31 MED ORDER — LIDOCAINE HCL (CARDIAC) 20 MG/ML IV SOLN
INTRAVENOUS | Status: DC | PRN
  Administered 2017-03-31: 50 mg via INTRAVENOUS

## 2017-03-31 MED ORDER — TIZANIDINE HCL 4 MG PO TABS: 4.0000 mg | ORAL_TABLET | Freq: Every evening | ORAL | Status: DC | PRN

## 2017-03-31 MED ORDER — MIDAZOLAM HCL 5 MG/5ML IJ SOLN
INTRAMUSCULAR | Status: DC | PRN
  Administered 2017-03-31: 2 mg via INTRAVENOUS

## 2017-03-31 MED ORDER — MENTHOL 3 MG MT LOZG: 1.0000 | LOZENGE | OROMUCOSAL | Status: DC | PRN

## 2017-03-31 MED ORDER — ALUM & MAG HYDROXIDE-SIMETH 200-200-20 MG/5ML PO SUSP: 30.0000 mL | ORAL | Status: DC | PRN

## 2017-03-31 MED ORDER — CEFAZOLIN SODIUM-DEXTROSE 2-3 GM-% IV SOLR
INTRAVENOUS | Status: DC | PRN
  Administered 2017-03-31: 2 g via INTRAVENOUS

## 2017-03-31 MED ORDER — FENTANYL CITRATE (PF) 100 MCG/2ML IJ SOLN
INTRAMUSCULAR | Status: DC | PRN
  Administered 2017-03-31: 50 ug via INTRAVENOUS
  Administered 2017-03-31: 100 ug via INTRAVENOUS
  Administered 2017-03-31 (×2): 50 ug via INTRAVENOUS

## 2017-03-31 MED ORDER — PHENYLEPHRINE HCL 10 MG/ML IJ SOLN
INTRAMUSCULAR | Status: DC | PRN
  Administered 2017-03-31 (×2): 80 ug via INTRAVENOUS

## 2017-03-31 MED ORDER — MORPHINE SULFATE (PF) 4 MG/ML IV SOLN
2.0000 mg | INTRAVENOUS | Status: DC | PRN
  Administered 2017-03-31 – 2017-04-02 (×4): 4 mg via INTRAVENOUS
  Filled 2017-03-31 (×4): qty 1

## 2017-03-31 MED ORDER — PHENOL 1.4 % MT LIQD: 1.0000 | OROMUCOSAL | Status: DC | PRN

## 2017-03-31 SURGICAL SUPPLY — 60 items
ADH SKN CLS LQ APL DERMABOND (GAUZE/BANDAGES/DRESSINGS) ×1
BANDAGE ELASTIC 6 VELCRO ST LF (GAUZE/BANDAGES/DRESSINGS) ×1 IMPLANT
BIT DRILL CALIBRATED 4.3MMX365 (DRILL) IMPLANT
BIT DRILL CROWE PNT TWST 4.5MM (DRILL) IMPLANT
CUFF TOURNIQUET SINGLE 34IN LL (TOURNIQUET CUFF) IMPLANT
CUFF TOURNIQUET SINGLE 44IN (TOURNIQUET CUFF) IMPLANT
DERMABOND ADHESIVE PROPEN (GAUZE/BANDAGES/DRESSINGS) ×1
DERMABOND ADVANCED .7 DNX6 (GAUZE/BANDAGES/DRESSINGS) IMPLANT
DRAPE C-ARM 42X72 X-RAY (DRAPES) ×2 IMPLANT
DRAPE IMP U-DRAPE 54X76 (DRAPES) ×2 IMPLANT
DRAPE INCISE IOBAN 66X45 STRL (DRAPES) ×2 IMPLANT
DRAPE ORTHO SPLIT 77X108 STRL (DRAPES) ×4
DRAPE SURG 17X23 STRL (DRAPES) ×2 IMPLANT
DRAPE SURG ORHT 6 SPLT 77X108 (DRAPES) ×2 IMPLANT
DRILL CALIBRATED 4.3MMX365 (DRILL) ×2
DRILL CROWE POINT TWIST 4.5MM (DRILL) ×2
DRSG ADAPTIC 3X8 NADH LF (GAUZE/BANDAGES/DRESSINGS) ×2 IMPLANT
DRSG AQUACEL AG ADV 3.5X 6 (GAUZE/BANDAGES/DRESSINGS) ×1 IMPLANT
DRSG PAD ABDOMINAL 8X10 ST (GAUZE/BANDAGES/DRESSINGS) ×2 IMPLANT
DRSG TEGADERM 2-3/8X2-3/4 SM (GAUZE/BANDAGES/DRESSINGS) ×1 IMPLANT
DRSG TEGADERM 4X4.75 (GAUZE/BANDAGES/DRESSINGS) ×1 IMPLANT
DURAPREP 26ML APPLICATOR (WOUND CARE) ×2 IMPLANT
ELECT CAUTERY BLADE 6.4 (BLADE) ×2 IMPLANT
ELECT REM PT RETURN 9FT ADLT (ELECTROSURGICAL) ×2
ELECTRODE REM PT RTRN 9FT ADLT (ELECTROSURGICAL) ×1 IMPLANT
EVACUATOR 1/8 PVC DRAIN (DRAIN) IMPLANT
GAUZE SPONGE 2X2 8PLY NS (GAUZE/BANDAGES/DRESSINGS) ×1 IMPLANT
GAUZE SPONGE 4X4 12PLY STRL (GAUZE/BANDAGES/DRESSINGS) ×2 IMPLANT
GLOVE BIOGEL PI IND STRL 7.5 (GLOVE) ×1 IMPLANT
GLOVE BIOGEL PI IND STRL 8 (GLOVE) ×1 IMPLANT
GLOVE BIOGEL PI INDICATOR 7.5 (GLOVE) ×1
GLOVE BIOGEL PI INDICATOR 8 (GLOVE) ×1
GLOVE ORTHO TXT STRL SZ7.5 (GLOVE) ×2 IMPLANT
GLOVE SURG ORTHO 8.0 STRL STRW (GLOVE) ×2 IMPLANT
GOWN STRL REUS W/ TWL LRG LVL3 (GOWN DISPOSABLE) ×3 IMPLANT
GOWN STRL REUS W/TWL LRG LVL3 (GOWN DISPOSABLE) ×6
GUIDEWIRE BEAD TIP (WIRE) ×1 IMPLANT
KIT BASIN OR (CUSTOM PROCEDURE TRAY) ×2 IMPLANT
KIT ROOM TURNOVER OR (KITS) ×2 IMPLANT
MANIFOLD NEPTUNE II (INSTRUMENTS) ×2 IMPLANT
NAIL FEM RETRO 10.5X300 (Nail) ×1 IMPLANT
NS IRRIG 1000ML POUR BTL (IV SOLUTION) ×2 IMPLANT
PACK UNIVERSAL I (CUSTOM PROCEDURE TRAY) ×2 IMPLANT
PAD ARMBOARD 7.5X6 YLW CONV (MISCELLANEOUS) ×4 IMPLANT
REAMER ONE STEP 12.2MM (TRAUMA) ×1 IMPLANT
SCREW CORT TI DBL LEAD 5X56 (Screw) ×1 IMPLANT
SCREW CORT TI DBL LEAD 5X60 (Screw) ×1 IMPLANT
SCREW CORT TI DBL LEAD 5X75 (Screw) ×2 IMPLANT
SCREW CORT TI DBLE LEAD 5X28 (Screw) ×1 IMPLANT
STAPLER VISISTAT 35W (STAPLE) ×2 IMPLANT
SUT VIC AB 0 CT1 27 (SUTURE) ×6
SUT VIC AB 0 CT1 27XBRD ANBCTR (SUTURE) ×3 IMPLANT
SUT VIC AB 1 CT1 27 (SUTURE) ×2
SUT VIC AB 1 CT1 27XBRD ANBCTR (SUTURE) ×1 IMPLANT
SUT VIC AB 2-0 CT1 27 (SUTURE) ×2
SUT VIC AB 2-0 CT1 TAPERPNT 27 (SUTURE) ×1 IMPLANT
TOWEL OR 17X24 6PK STRL BLUE (TOWEL DISPOSABLE) ×2 IMPLANT
TOWEL OR 17X26 10 PK STRL BLUE (TOWEL DISPOSABLE) ×2 IMPLANT
WATER STERILE IRR 1000ML POUR (IV SOLUTION) ×2 IMPLANT
WRAP KNEE MAXI GEL POST OP (GAUZE/BANDAGES/DRESSINGS) ×1 IMPLANT

## 2017-03-31 NOTE — Brief Op Note (Signed)
03/30/2017 - 03/31/2017  10:32 AM  PATIENT:  Heather [name]  66 y.o. female  PRE-OPERATIVE DIAGNOSIS:  Right Distal Femur Peri-prosthetic Fracture  POST-OPERATIVE DIAGNOSIS:  Right Distal Femur Peri-prosthetic Fracture  PROCEDURE:  Procedure(s): OPEN REDUCTION INTERNAL FIXATION (ORIF) DISTAL FEMUR FRACTURE (Right)  Retrograde IMN - [employer] [place] Nail  SURGEON:  Surgeon(s) and Role:    * Paralee Cancel, MD - Primary  PHYSICIAN ASSISTANT: None  ANESTHESIA:   general  EBL:  300 cc  BLOOD ADMINISTERED:none  DRAINS: none   LOCAL MEDICATIONS USED:  NONE  SPECIMEN:  No Specimen  DISPOSITION OF SPECIMEN:  N/A  COUNTS:  YES  TOURNIQUET:  * No tourniquets in log *  DICTATION: .Other Dictation: Dictation Number [id number]  PLAN OF CARE: Admit to inpatient   PATIENT DISPOSITION:  PACU - hemodynamically stable.   Delay start of Pharmacological VTE agent (>24hrs) due to surgical blood loss or risk of bleeding: no

## 2017-03-31 NOTE — Anesthesia Procedure Notes (Addendum)
Procedure Name: Intubation Date/Time: 03/31/2017 10:50 AM Performed by: Lavell Luster Pre-anesthesia Checklist: Patient identified, Emergency Drugs available, Suction available, Patient being monitored and Timeout performed Patient Re-evaluated:Patient Re-evaluated prior to inductionOxygen Delivery Method: Circle system utilized Preoxygenation: Pre-oxygenation with 100% oxygen Intubation Type: IV induction and Rapid sequence Ventilation: Mask ventilation without difficulty Laryngoscope Size: Mac and 3 Grade View: Grade I Tube type: Oral Tube size: 7.5 mm Number of attempts: 1 Airway Equipment and Method: Stylet Placement Confirmation: ETT inserted through vocal cords under direct vision Secured at: 22 cm Tube secured with: Tape Dental Injury: Teeth and Oropharynx as per pre-operative assessment  Difficulty Due To: Difficulty was anticipated

## 2017-03-31 NOTE — Op Note (Signed)
NAMEDONNAE, [name] NO.:  [id number]  MEDICAL RECORD NO.:  [id number]  LOCATION:                                 FACILITY:  PHYSICIAN:  Pietro Cassis. Alvan Dame, M.D.  DATE OF BIRTH:  31-Oct-1950  DATE OF PROCEDURE:  03/31/2017 DATE OF DISCHARGE:                              OPERATIVE REPORT   PREOPERATIVE DIAGNOSIS:  Closed right distal periprosthetic femur fracture.  POSTOPERATIVE DIAGNOSIS:  Closed right distal periprosthetic femur fracture.  PROCEDURE:  Open reduction and internal fixation of right distal periprosthetic fracture utilizing [employer] [place] intramedullary nail, 10.5 x 300 mm with a 4 distal interlocks and a 1 proximal interlock.  SURGEON:  Pietro Cassis. Alvan Dame, M.D.  ASSISTANT:  Surgical team.  ANESTHESIA:  General.  SPECIMENS:  None.  COMPLICATION:  None.  BLOOD LOSS:  About 300 mL.  INDICATIONS FOR PROCEDURE:  [name] is a pleasant 66 year old female with a history of right total knee arthroplasty performed at an outside facility.  She presented after slipping on some water maybe in her kitchen with hyperflexion and landing on her right knee.  She had immediate onset of pain and unfortunately was down for period of time until she was found by her husband.  She was then brought to the emergency room on Saturday, where radiographs revealed a distal periprosthetic femur fracture. Orthopedics was consulted for management.  Reviewed the risks, benefits, indications of the procedure.  The risks include nonunion, need for future surgeries, malunion.  The risk of infection and DVT discussed as well.  Consent was obtained for benefit of fracture management and restoring to normal function.  PROCEDURE IN DETAIL:  The patient was brought to the operative theater. Once adequate anesthesia, preoperative antibiotics, Ancef administered, she was positioned supine.  The perineum was prepped out with a 10/15 drape.  The right lower extremity was  then prepped and draped in sterile fashion.  Time-out was performed identifying the patient, planned procedure, and extremity.  Her old incision was identified and demarcated.  I excised her old incision.  Soft tissue plane was created and a median arthrotomy was made.  I was able to evacuate hematoma formed synovectomy to allow for exposure for the distal interlocking screws.  Following this, the leg was placed over to the triangle for the leg holding purpose.  I placed a guidewire into the distal aspect of the femur into the open box of the system of this knee is on.  I then evaluated in AP and lateral planes, identifying that the femur was anatomically positioned in AP and lateral planes with no evidence of extension or flexion of the femur.  At this point, I opened up the distal femur with starting reamer.  I then placed a ball-tipped guidewire and measured the depth and selected a 300 mm nail.  I then reamed with an 11.5 mm reamers and then selected the 10.5 mm nail.  The 10.5 mm x 300 mm nail was then placed by hand over the guidewire into its appropriate depth.  Once this was confirmed radiographically, I placed 4 screws in the distal aspect of the joint under fluoroscopic imaging.  Once this was done and confirmed radiographically in AP  and lateral planes, I tightened it down to lock it in place per the knee design.  The jig was then removed.  At this point, a 1 proximal distal interlock through a perfect circle technique was performed.  Once this was done, radiographs were obtained in AP and lateral planes and wounds were irrigated with normal saline solution.  The distal knee incision was irrigated thoroughly due to the total knee replacement.  I then reapproximated the arthrotomy using #1 Vicryl and 0 V-Loc sutures. The remainder of wound was closed with 2-0 Vicryl and a running 3-0 Monocryl.  The proximal interlock was closed with 2-0 Vicryl and dressed with  gauze and a Tegaderm.  I cleaned the knee and dressed it with Dermabond and Aquacel dressing.  She was then woken from anesthesia and tolerated the procedure well without complication.     Pietro Cassis Alvan Dame, M.D.     MDO/MEDQ  D:  03/31/2017  T:  03/31/2017  Job:  [id number]

## 2017-03-31 NOTE — Progress Notes (Signed)
Dear Doctor: Heather [name] This patient has been identified as a candidate for PICC for the following reason (s): IV therapy over 48 hours, drug extravasation potential with tissue necrosis (KCL, Dilantin, Dopamine, CaCl, MgSO4, chemo vesicant), poor veins/poor circulatory system (CHF, COPD, emphysema, diabetes, steroid use, IV drug abuse, etc.) and restarts due to phlebitis and infiltration in 24 hours If you agree, please write an order for the indicated device. For any questions contact the Vascular Access Team at [phone] if no answer, please leave a message.  Thank you for supporting the early vascular access assessment program.

## 2017-03-31 NOTE — Transfer of Care (Signed)
Immediate Anesthesia Transfer of Care Note  Patient: Heather [name]  Procedure(s) Performed: Procedure(s): OPEN REDUCTION INTERNAL FIXATION (ORIF) DISTAL FEMUR FRACTURE (Right)  Patient Location: PACU  Anesthesia Type:General  Level of Consciousness: awake, alert  and oriented  Airway & Oxygen Therapy: Patient connected to face mask oxygen  Post-op Assessment: Post -op Vital signs reviewed and stable  Post vital signs: stable  Last Vitals:  Vitals:   03/31/17 0354 03/31/17 0900  BP: (!) 137/42 (!) 174/56  Pulse: 86 96  Resp: 16 16  Temp: 36.7 C 37.2 C    Last Pain:  Vitals:   03/31/17 0927  TempSrc:   PainSc: 10-Worst pain ever      Patients Stated Pain Goal: 3 (08/28/58 4585)  Complications: No apparent anesthesia complications

## 2017-03-31 NOTE — Anesthesia Preprocedure Evaluation (Signed)
Anesthesia Evaluation  Patient identified by MRN, date of birth, ID band Patient awake    Reviewed: Allergy & Precautions, NPO status , Patient's Chart, lab work & pertinent test results  Airway Mallampati: II  TM Distance: >3 FB     Dental   Pulmonary shortness of breath,    breath sounds clear to auscultation       Cardiovascular hypertension,  Rhythm:Regular Rate:Normal     Neuro/Psych  Headaches,  Neuromuscular disease    GI/Hepatic GERD  ,  Endo/Other  diabetes  Renal/GU      Musculoskeletal  (+) Arthritis ,   Abdominal   Peds  Hematology   Anesthesia Other Findings   Reproductive/Obstetrics                             Anesthesia Physical Anesthesia Plan  ASA: III  Anesthesia Plan: General   Post-op Pain Management:    Induction: Intravenous  Airway Management Planned: Oral ETT  Additional Equipment:   Intra-op Plan:   Post-operative Plan: Possible Post-op intubation/ventilation  Informed Consent: I have reviewed the patients History and Physical, chart, labs and discussed the procedure including the risks, benefits and alternatives for the proposed anesthesia with the patient or authorized representative who has indicated his/her understanding and acceptance.   Dental advisory given  Plan Discussed with: CRNA and Anesthesiologist  Anesthesia Plan Comments:         Anesthesia Quick Evaluation

## 2017-03-31 NOTE — Progress Notes (Signed)
PROGRESS NOTE    Heather [name]  SJG:[id number] DOB: 1951/06/24 DOA: 03/30/2017 PCP: [name], MD    Brief Narrative:  Patient is a 66 year old female history of type 2 diabetes, hypertension, hyperlipidemia, chronic kidney disease stage III presenting after a fall with comminuted distal right femur fracture. Patient also noted to be in acute on chronic kidney disease stage III. Patients in consultation by orthopedics and patient underwent ORIF 03/31/2017.   Assessment & Plan:   Principal Problem:   Femur fracture, right (HCC) Active Problems:   Irritable bowel syndrome   Vitamin B12 deficiency   Chronic kidney disease, stage III (moderate)   Essential hypertension   Diabetes mellitus with chronic kidney disease (HCC)   GERD (gastroesophageal reflux disease)   Mixed hyperlipidemia   Depression   Acute renal failure superimposed on stage 3 chronic kidney disease ([place])  #1 closed right distal periprosthetic femur fracture Secondary to mechanical fall. Status post ORIF per Dr. Alvan Dame 03/31/2017. PT/OT. Likely needs skilled nursing facility.  #2 acute on chronic kidney disease stage III Likely secondary to prerenal renal azotemia in the setting of diuretics. Lasix on hold. Check a fractional excretion of sodium. Check a UA with cultures and sensitivities. Renal ultrasound negative for hydronephrosis. Gentle hydration and follow.   #3 diabetes mellitus Hemoglobin A1c 8.9 03/07/2017. Continue 1/2 home dose levemir and titrate once patient is tolerating diet. Sliding scale insulin. Reglan.  #4 gastroesophageal reflux disease Continue PPI.  #5 hypertension Continue Lopressor.  #6 depression Stable. Continue Celexa.  #7 hyperlipidemia Continue statin.  #8 IBS Stable. Outpatient follow-up.     DVT prophylaxis: Lovenox Code Status: Full Family Communication: Updated patient. Disposition Plan: Likely skilled nursing facility hopefully 04/02/2017   Consultants:    Orthopedics: Dr. Alvan Dame 03/31/2017  Procedures:   Chest x-ray 03/31/2017  Plain films right elbow 03/30/2017  Plain films right knee 03/30/2017  Renal ultrasound 03/31/2017  ORIF right distal periprosthetic fracture 03/31/2017 per Dr. Alvan Dame  Antimicrobials:  None   Subjective: Patient in PACU. Patient postop.  Objective: Vitals:   03/31/17 1245 03/31/17 1300 03/31/17 1314 03/31/17 1330  BP: (!) 203/76  (!) 165/80 (!) 153/67  Pulse: 94 99 95 89  Resp: 16 19 19 17   Temp:      TempSrc:      SpO2: 92% 95% 95% 91%  Weight:      Height:        Intake/Output Summary (Last 24 hours) at 03/31/17 1410 Last data filed at 03/31/17 1219  Gross per 24 hour  Intake             1800 ml  Output             1550 ml  Net              250 ml   Filed Weights   03/30/17 1224  Weight: 74.4 kg (164 lb)    Examination:  General exam: In PACU. Dry mucous membranes Respiratory system: Clear to auscultation anterior lung fields. Respiratory effort normal. Cardiovascular system: S1 & S2 heard, RRR. No JVD, murmurs, rubs, gallops or clicks. No pedal edema. Gastrointestinal system: Abdomen is nondistended, soft and nontender. No organomegaly or masses felt. Normal bowel sounds heard. Central nervous system: Postop. Opens eyes to verbal stimuli. Follow commands. No focal neurological deficits. Extremities: Symmetric 5 x 5 power. Skin: No rashes, lesions or ulcers Psychiatry: Judgement and insight appear fair. Mood & affect appropriate.     Data Reviewed: I have  personally reviewed following labs and imaging studies  CBC:  Recent Labs Lab 03/30/17 1313  WBC 12.9*  NEUTROABS 10.2*  HGB 10.0*  HCT 31.0*  MCV 86.6  PLT 209   Basic Metabolic Panel:  Recent Labs Lab 03/30/17 1313  NA 131*  K 3.8  CL 98*  CO2 20*  GLUCOSE 399*  BUN 37*  CREATININE 2.49*  CALCIUM 9.3   GFR: Estimated Creatinine Clearance: 21.5 mL/min (A) (by C-G formula based on SCr of 2.49 mg/dL  (H)). Liver Function Tests: No results for input(s): AST, ALT, ALKPHOS, BILITOT, PROT, ALBUMIN in the last 168 hours. No results for input(s): LIPASE, AMYLASE in the last 168 hours. No results for input(s): AMMONIA in the last 168 hours. Coagulation Profile: No results for input(s): INR, PROTIME in the last 168 hours. Cardiac Enzymes:  Recent Labs Lab 03/30/17 1313  CKTOTAL 176   BNP (last 3 results) No results for input(s): PROBNP in the last 8760 hours. HbA1C: No results for input(s): HGBA1C in the last 72 hours. CBG:  Recent Labs Lab 03/30/17 2015 03/30/17 2353 03/31/17 0352 03/31/17 0752 03/31/17 1234  GLUCAP 257* 211* 115* 219* 157*   Lipid Profile: No results for input(s): CHOL, HDL, LDLCALC, TRIG, CHOLHDL, LDLDIRECT in the last 72 hours. Thyroid Function Tests: No results for input(s): TSH, T4TOTAL, FREET4, T3FREE, THYROIDAB in the last 72 hours. Anemia Panel: No results for input(s): VITAMINB12, FOLATE, FERRITIN, TIBC, IRON, RETICCTPCT in the last 72 hours. Sepsis Labs: No results for input(s): PROCALCITON, LATICACIDVEN in the last 168 hours.  Recent Results (from the past 240 hour(s))  Surgical pcr screen     Status: None   Collection Time: 03/30/17 10:20 PM  Result Value Ref Range Status   MRSA, PCR NEGATIVE NEGATIVE Final   Staphylococcus aureus NEGATIVE NEGATIVE Final    Comment:        The Xpert SA Assay (FDA approved for NASAL specimens in patients over 50 years of age), is one component of a comprehensive surveillance program.  Test performance has been validated by [facility] for patients greater than or equal to 14 year old. It is not intended to diagnose infection nor to guide or monitor treatment.          Radiology Studies: Dg Elbow Complete Right  Result Date: 03/30/2017 CLINICAL DATA:  Fall.  Pain. EXAM: RIGHT ELBOW - COMPLETE 3+ VIEW COMPARISON:  Although images are unavailable, the report for a study from 01/13/2014 has been  reviewed. FINDINGS: Anterior fat pad slightly prominent but not frankly elevated. Nonvisualization posterior fat pad. No evidence for an acute fracture. No evidence for subluxation or dislocation. No worrisome lytic or sclerotic osseous abnormality. IMPRESSION: Negative. Electronically Signed   By: Misty Stanley M.D.   On: 03/30/2017 15:19   US Renal  Result Date: 03/31/2017 CLINICAL DATA:  Acute renal failure. History of diabetes, hypertension and chronic stage 3 kidney disease. EXAM: RENAL / URINARY TRACT ULTRASOUND COMPLETE COMPARISON:  Abdominal MRI -01/18/2017 FINDINGS: Examination is degraded due to patient body habitus and poor sonographic when Right Kidney: Normal cortical thickness, echogenicity and size, measuring 9.8 cm in length. No focal renal lesions. No echogenic renal stones. No urinary obstruction. Left Kidney: Normal cortical thickness, echogenicity and size, measuring 10.2 cm in length. Note is made of a punctate (approximately 0.8 x 0.4 x 0.7 cm anechoic partially exophytic cystic lesion arising from the inferior pole of the left kidney. No echogenic renal stones. No urinary obstruction. Bladder: Appears normal for  degree of bladder distention. IMPRESSION: 1. No explanation for patient's acute renal failure. Specifically, no evidence of urinary obstruction or evidence of significant renal atrophy. 2. Punctate subcentimeter anechoic lesion arising from the inferior pole of the left kidney, too small to accurately characterize though favored to represent a renal cyst. Electronically Signed   By: Sandi Mariscal M.D.   On: 03/31/2017 10:03   Chest Portable 1 View  Result Date: 03/31/2017 CLINICAL DATA:  ORIF of distal femur fracture - preoperative examination. EXAM: PORTABLE CHEST 1 VIEW COMPARISON:  12/25/2016 FINDINGS: Grossly unchanged cardiac silhouette and mediastinal contours with thickening of the right paratracheal stripe presumably secondary to prominent vasculature. No focal airspace  opacities. No pleural effusion or pneumothorax. No evidence of edema. No acute osseus abnormalities. Post lower cervical ACDF and paraspinal fusion, incompletely evaluated. Old right clavicular fracture. IMPRESSION: No acute cardiopulmonary disease. Electronically Signed   By: Sandi Mariscal M.D.   On: 03/31/2017 10:05   Dg Knee Complete 4 Views Right  Result Date: 03/30/2017 CLINICAL DATA:  Patient fell.  Pain. EXAM: RIGHT KNEE - COMPLETE 4+ VIEW COMPARISON:  None. FINDINGS: Status post tricompartmental knee replacement. Comminuted fracture of the distal femoral metaphysis is identified with mild apex anterior angulation. Bones are diffusely demineralized. IMPRESSION: Comminuted fracture distal femoral metaphysis in this patient status post tricompartmental knee replacement. Electronically Signed   By: Misty Stanley M.D.   On: 03/30/2017 15:17   Dg C-arm 1-60 Min  Result Date: 03/31/2017 CLINICAL DATA:  Right femoral retrograde nail EXAM: DG C-ARM 61-120 MIN; RIGHT FEMUR 2 VIEWS FLUOROSCOPY TIME:  1 minute, 18 seconds COMPARISON:  Right knee radiographs -03/31/2027 FINDINGS: Four spot intraoperative fluoroscopic images of the right femur are provided for review Images demonstrate the sequela of intramedullary rod fixation of previously noted comminuted distal femoral metaphyseal fracture. The distal end of the femoral rod is transfixed with 4 cancellous screws while the proximal end is transfixed with a single cancellous screw. Alignment appears improved. Expected adjacent subcutaneous emphysema. No radiopaque foreign body. Stable sequela of right total knee replacement, incompletely evaluated. IMPRESSION: Post intramedullary rod fixation of distal femoral metaphyseal fracture without evidence of complication. Electronically Signed   By: Sandi Mariscal M.D.   On: 03/31/2017 12:09   Dg Hip Unilat W Or Wo Pelvis 2-3 Views Right  Result Date: 03/30/2017 CLINICAL DATA:  Acute right hip pain following fall last  night. Initial encounter. EXAM: DG HIP (WITH OR WITHOUT PELVIS) 2-3V RIGHT COMPARISON:  01/29/2014 FINDINGS: There is no evidence of acute fracture subluxation. Mild degenerative changes in the hips again noted. No focal bony lesions are present. IMPRESSION: No acute abnormality. Electronically Signed   By: Margarette Canada M.D.   On: 03/30/2017 15:20   Dg Femur, Min 2 Views Right  Result Date: 03/31/2017 CLINICAL DATA:  Right femoral retrograde nail EXAM: DG C-ARM 61-120 MIN; RIGHT FEMUR 2 VIEWS FLUOROSCOPY TIME:  1 minute, 18 seconds COMPARISON:  Right knee radiographs -03/31/2027 FINDINGS: Four spot intraoperative fluoroscopic images of the right femur are provided for review Images demonstrate the sequela of intramedullary rod fixation of previously noted comminuted distal femoral metaphyseal fracture. The distal end of the femoral rod is transfixed with 4 cancellous screws while the proximal end is transfixed with a single cancellous screw. Alignment appears improved. Expected adjacent subcutaneous emphysema. No radiopaque foreign body. Stable sequela of right total knee replacement, incompletely evaluated. IMPRESSION: Post intramedullary rod fixation of distal femoral metaphyseal fracture without evidence of complication. Electronically Signed  By: Sandi Mariscal M.D.   On: 03/31/2017 12:09        Scheduled Meds: . [MAR Hold] allopurinol  100 mg Oral Daily  . [MAR Hold] atorvastatin  80 mg Oral q1800  . [MAR Hold] cholecalciferol  1,000 Units Oral Daily  . [MAR Hold] citalopram  40 mg Oral Daily  . ferrous sulfate  325 mg Oral BID WC  . [MAR Hold] heparin  5,000 Units Subcutaneous Q8H  . [MAR Hold] insulin aspart  0-20 Units Subcutaneous Q4H  . [MAR Hold] insulin detemir  40 Units Subcutaneous QHS  . [MAR Hold] metoCLOPramide  10 mg Oral TID AC  . [MAR Hold] metoprolol tartrate  100 mg Oral BID  . [MAR Hold] pantoprazole  80 mg Oral Q1200  . [MAR Hold] senna  1 tablet Oral BID   Continuous  Infusions: . sodium chloride Stopped (03/31/17 1219)  . ceFAZolin    .  ceFAZolin (ANCEF) IV    . lactated ringers 10 mL/hr at 03/31/17 0952  . [MAR Hold] methocarbamol (ROBAXIN)  IV    . methocarbamol (ROBAXIN)  IV       LOS: 1 day    Time spent: 79 minutes    THOMPSON,DANIEL, MD [facility] Pager [phone]  If 7PM-7AM, please contact night-coverage [email or web address] Password [email or web address] 03/31/2017, 2:10 PM

## 2017-03-31 NOTE — Anesthesia Postprocedure Evaluation (Signed)
Anesthesia Post Note  Patient: Heather [name]  Procedure(s) Performed: Procedure(s) (LRB): OPEN REDUCTION INTERNAL FIXATION (ORIF) DISTAL FEMUR FRACTURE (Right)     Patient location during evaluation: PACU Anesthesia Type: General Level of consciousness: awake Pain management: pain level controlled Respiratory status: spontaneous breathing Cardiovascular status: stable Anesthetic complications: no    Last Vitals:  Vitals:   03/31/17 1230 03/31/17 1245  BP: (!) 175/68 (!) 203/76  Pulse: 82 94  Resp: (!) 8 16  Temp: 36.3 C     Last Pain:  Vitals:   03/31/17 0927  TempSrc:   PainSc: 10-Worst pain ever                 Galan Ghee

## 2017-03-31 NOTE — Consult Note (Signed)
Reason for Consult: Right distal peri-prosthetic femur fracture Referring Physician:  Vance Gather, MD  Heather [name] is an 66 y.o. female.  HPI: 66 yo female slipped on water in her kitchen landing on right knee.  She has some other abrasions, contusion but no other major complaints.   She has a history of right TKR   Past Medical History:  Diagnosis Date  . B12 deficiency   . Cellulitis and abscess    Abdomen and buttocks  . CKD (chronic kidney disease) stage 3, GFR 30-59 ml/min   . Depression   . Diabetic neuropathy ([place])   . DJD (degenerative joint disease)    Right forminal stenosis C4-5  . Essential hypertension   . Folliculitis   . Gout   . Headache(784.0)   . Hiatal hernia   . History of bronchitis   . History of cardiac catheterization    No significant CAD 2012  . Insomnia   . Irritable bowel syndrome   . Mixed hyperlipidemia   . Osteoarthritis   . Palpitations   . Reflux esophagitis   . Tinnitus    Right  . Type 2 diabetes mellitus [facility])     Past Surgical History:  Procedure Laterality Date  . c-spine surgery     03/2007  . CARDIAC CATHETERIZATION    . CHOLECYSTECTOMY    . COLONOSCOPY  04/21/2011; 05/29/11   6/12: [place] - ?AVM at IC valve, inflammatory changes at [facility] valve [facility]); 7/12 - [facility]; IC valve erosions, look chronic and probably Crohn's per path  . ESOPHAGOGASTRODUODENOSCOPY  05/29/11   normal  . ESOPHAGOGASTRODUODENOSCOPY N/A 01/21/2016   Procedure: ESOPHAGOGASTRODUODENOSCOPY (EGD);  Surgeon: Wilford Corner, MD;  [facility] ENDOSCOPY;  Service: Endoscopy;  Laterality: N/A;  . JOINT REPLACEMENT    . Right knee arthroscopic surgery    . TOTAL ABDOMINAL HYSTERECTOMY      Family History  Problem Relation Age of Onset  . Diabetes Mother   . Colon cancer Neg Hx     Social History:  reports that she has never smoked. She has never used smokeless tobacco. She reports that she does not drink alcohol or use  drugs.  Allergies:  Allergies  Allergen Reactions  . Duloxetine Hcl Swelling  . Fluocinolone Swelling  . Acetaminophen Itching and Swelling    Pt states "some swelling"  . Amlodipine Besylate Swelling    Swelling of feet, fluid retention  . Ciprofloxacin Nausea And Vomiting and Swelling    Swelling of face, jaw, and lips  . Hydrocodone-Acetaminophen Itching  . Metformin Other (See Comments)    REACTION: GI UPSET  . Nsaids Other (See Comments)    "HAS BLEEDING ULCERS"  . Quinolones Swelling  . Sulfamethoxazole Swelling    Swelling of feet, legs  . Sulfonamide Derivatives Swelling  . Valsartan Swelling  . Cozaar [Losartan] Swelling and Rash  . Lisinopril Swelling and Rash    Oral swelling, and red streaks on arms/stomach    Medications: I have reviewed the patient's current medications.  Results for orders placed or performed during the hospital encounter of 03/30/17 (from the past 24 hour(s))  Basic metabolic panel     Status: Abnormal   Collection Time: 03/30/17  1:13 PM  Result Value Ref Range   Sodium 131 (L) 135 - 145 mmol/L   Potassium 3.8 3.5 - 5.1 mmol/L   Chloride 98 (L) 101 - 111 mmol/L   CO2 20 (L) 22 - 32 mmol/L   Glucose, Bld 399 (  H) 65 - 99 mg/dL   BUN 37 (H) 6 - 20 mg/dL   Creatinine, Ser 2.49 (H) 0.44 - 1.00 mg/dL   Calcium 9.3 8.9 - 10.3 mg/dL   GFR calc non Af Amer 19 (L) >60 mL/min   GFR calc Af Amer 22 (L) >60 mL/min   Anion gap 13 5 - 15  CBC with Differential     Status: Abnormal   Collection Time: 03/30/17  1:13 PM  Result Value Ref Range   WBC 12.9 (H) 4.0 - 10.5 K/uL   RBC 3.58 (L) 3.87 - 5.11 MIL/uL   Hemoglobin 10.0 (L) 12.0 - 15.0 g/dL   HCT 31.0 (L) 36.0 - 46.0 %   MCV 86.6 78.0 - 100.0 fL   MCH 27.9 26.0 - 34.0 pg   MCHC 32.3 30.0 - 36.0 g/dL   RDW 15.1 11.5 - 15.5 %   Platelets 253 150 - 400 K/uL   Neutrophils Relative % 80 %   Neutro Abs 10.2 (H) 1.7 - 7.7 K/uL   Lymphocytes Relative 11 %   Lymphs Abs 1.4 0.7 - 4.0 K/uL    Monocytes Relative 9 %   Monocytes Absolute 1.2 (H) 0.1 - 1.0 K/uL   Eosinophils Relative 0 %   Eosinophils Absolute 0.0 0.0 - 0.7 K/uL   Basophils Relative 0 %   Basophils Absolute 0.0 0.0 - 0.1 K/uL  CK     Status: None   Collection Time: 03/30/17  1:13 PM  Result Value Ref Range   Total CK 176 38 - 234 U/L  Glucose, capillary     Status: Abnormal   Collection Time: 03/30/17  8:15 PM  Result Value Ref Range   Glucose-Capillary 257 (H) 65 - 99 mg/dL  Surgical pcr screen     Status: None   Collection Time: 03/30/17 10:20 PM  Result Value Ref Range   MRSA, PCR NEGATIVE NEGATIVE   Staphylococcus aureus NEGATIVE NEGATIVE  Glucose, capillary     Status: Abnormal   Collection Time: 03/30/17 11:53 PM  Result Value Ref Range   Glucose-Capillary 211 (H) 65 - 99 mg/dL  Glucose, capillary     Status: Abnormal   Collection Time: 03/31/17  3:52 AM  Result Value Ref Range   Glucose-Capillary 115 (H) 65 - 99 mg/dL  Glucose, capillary     Status: Abnormal   Collection Time: 03/31/17  7:52 AM  Result Value Ref Range   Glucose-Capillary 219 (H) 65 - 99 mg/dL     X-ray: CLINICAL DATA:  Patient fell.  Pain.  EXAM: RIGHT KNEE - COMPLETE 4+ VIEW  COMPARISON:  None.  FINDINGS: Status post tricompartmental knee replacement. Comminuted fracture of the distal femoral metaphysis is identified with mild apex anterior angulation. Bones are diffusely demineralized.  IMPRESSION: Comminuted fracture distal femoral metaphysis in this patient status post tricompartmental knee replacement.   Electronically Signed   By: Misty Stanley M.D.  ROS  Per admitting H&P Nothing significant over past 2 weeks in any system  Blood pressure (!) 174/56, pulse 96, temperature 99 F (37.2 C), temperature source Oral, resp. rate 16, height 5\' 2"  (1.575 m), weight 74.4 kg (164 lb), SpO2 97 %.  Physical Exam Awake alert relatively comfortable without movement RUE >LUE abrasions and bruising, no  deformity Right leg in knee immobilizer, NVI  General medical exam reviewed for pertinent findings  Assessment/Plan: Right closed distal peri-prosthetic femur fracture  To OR for retrograde IMN of distal femur NPO Consent ordered and signed  Will be NWB for 6 weeks post-op  Holdyn Poyser D 03/31/2017, 9:51 AM

## 2017-04-01 ENCOUNTER — Inpatient Hospital Stay (HOSPITAL_COMMUNITY): Payer: Medicare Other

## 2017-04-01 ENCOUNTER — Encounter (HOSPITAL_COMMUNITY): Payer: Self-pay | Admitting: Interventional Radiology

## 2017-04-01 DIAGNOSIS — S72409A Unspecified fracture of lower end of unspecified femur, initial encounter for closed fracture: Secondary | ICD-10-CM

## 2017-04-01 DIAGNOSIS — E538 Deficiency of other specified B group vitamins: Secondary | ICD-10-CM

## 2017-04-01 HISTORY — PX: IR FLUORO GUIDE CV LINE RIGHT: IMG2283

## 2017-04-01 HISTORY — PX: IR US GUIDE VASC ACCESS RIGHT: IMG2390

## 2017-04-01 LAB — CBC
HCT: 25.6 % — ABNORMAL LOW (ref 36.0–46.0)
HEMOGLOBIN: 8 g/dL — AB (ref 12.0–15.0)
MCH: 28.1 pg (ref 26.0–34.0)
MCHC: 31.3 g/dL (ref 30.0–36.0)
MCV: 89.8 fL (ref 78.0–100.0)
PLATELETS: 214 10*3/uL (ref 150–400)
RBC: 2.85 MIL/uL — AB (ref 3.87–5.11)
RDW: 15.7 % — ABNORMAL HIGH (ref 11.5–15.5)
WBC: 10.8 10*3/uL — AB (ref 4.0–10.5)

## 2017-04-01 LAB — BASIC METABOLIC PANEL
ANION GAP: 9 (ref 5–15)
BUN: 25 mg/dL — AB (ref 6–20)
CHLORIDE: 105 mmol/L (ref 101–111)
CO2: 22 mmol/L (ref 22–32)
Calcium: 8.9 mg/dL (ref 8.9–10.3)
Creatinine, Ser: 1.87 mg/dL — ABNORMAL HIGH (ref 0.44–1.00)
GFR calc Af Amer: 32 mL/min — ABNORMAL LOW (ref >60)
GFR, EST NON AFRICAN AMERICAN: 27 mL/min — AB (ref >60)
Glucose, Bld: 154 mg/dL — ABNORMAL HIGH (ref 65–99)
POTASSIUM: 3.8 mmol/L (ref 3.5–5.1)
Sodium: 136 mmol/L (ref 135–145)

## 2017-04-01 LAB — FOLATE: Folate: 10.3 ng/mL (ref >5.9)

## 2017-04-01 LAB — URINE CULTURE: CULTURE: NO GROWTH

## 2017-04-01 LAB — IRON AND TIBC
IRON: 34 ug/dL (ref 28–170)
SATURATION RATIOS: 11 % (ref 10.4–31.8)
TIBC: 318 ug/dL (ref 250–450)
UIBC: 284 ug/dL

## 2017-04-01 LAB — GLUCOSE, CAPILLARY
GLUCOSE-CAPILLARY: 152 mg/dL — AB (ref 65–99)
GLUCOSE-CAPILLARY: 153 mg/dL — AB (ref 65–99)
GLUCOSE-CAPILLARY: 204 mg/dL — AB (ref 65–99)
Glucose-Capillary: 188 mg/dL — ABNORMAL HIGH (ref 65–99)
Glucose-Capillary: 198 mg/dL — ABNORMAL HIGH (ref 65–99)
Glucose-Capillary: 224 mg/dL — ABNORMAL HIGH (ref 65–99)

## 2017-04-01 LAB — PREPARE RBC (CROSSMATCH)

## 2017-04-01 LAB — HEMOGLOBIN AND HEMATOCRIT, BLOOD
HEMATOCRIT: 23.9 % — AB (ref 36.0–46.0)
Hemoglobin: 7.6 g/dL — ABNORMAL LOW (ref 12.0–15.0)

## 2017-04-01 LAB — FERRITIN: FERRITIN: 1829 ng/mL — AB (ref 11–307)

## 2017-04-01 LAB — VITAMIN B12: Vitamin B-12: 1493 pg/mL — ABNORMAL HIGH (ref 180–914)

## 2017-04-01 MED ORDER — METHOCARBAMOL 500 MG PO TABS: 500.0000 mg | ORAL_TABLET | Freq: Four times a day (QID) | ORAL | 0 refills | Status: DC | PRN

## 2017-04-01 MED ORDER — CITALOPRAM HYDROBROMIDE 20 MG PO TABS
20.0000 mg | ORAL_TABLET | Freq: Every day | ORAL | Status: DC
  Administered 2017-04-02 – 2017-04-03 (×2): 20 mg via ORAL
  Filled 2017-04-01 (×2): qty 1

## 2017-04-01 MED ORDER — OXYCODONE HCL 5 MG PO TABS: 5.0000 mg | ORAL_TABLET | ORAL | 0 refills | Status: DC | PRN

## 2017-04-01 MED ORDER — ACETAMINOPHEN 325 MG PO TABS
650.0000 mg | ORAL_TABLET | Freq: Once | ORAL | Status: AC
  Administered 2017-04-01: 650 mg via ORAL
  Filled 2017-04-01: qty 2

## 2017-04-01 MED ORDER — ENOXAPARIN SODIUM 30 MG/0.3ML ~~LOC~~ SOLN
30.0000 mg | SUBCUTANEOUS | Status: DC
  Administered 2017-04-02 – 2017-04-03 (×2): 30 mg via SUBCUTANEOUS
  Filled 2017-04-01 (×2): qty 0.3

## 2017-04-01 MED ORDER — SODIUM CHLORIDE 0.9% FLUSH: 10.0000 mL | INTRAVENOUS | Status: DC | PRN

## 2017-04-01 MED ORDER — LIDOCAINE HCL 1 % IJ SOLN
INTRAMUSCULAR | Status: DC | PRN
  Administered 2017-04-01: 10 mL

## 2017-04-01 MED ORDER — FUROSEMIDE 40 MG PO TABS
40.0000 mg | ORAL_TABLET | Freq: Every day | ORAL | Status: DC
  Administered 2017-04-01 – 2017-04-03 (×3): 40 mg via ORAL
  Filled 2017-04-01 (×3): qty 1

## 2017-04-01 MED ORDER — GLUCERNA SHAKE PO LIQD
237.0000 mL | Freq: Two times a day (BID) | ORAL | Status: DC
  Administered 2017-04-02 – 2017-04-03 (×3): 237 mL via ORAL

## 2017-04-01 MED ORDER — SODIUM CHLORIDE 0.9 % IV SOLN: Freq: Once | INTRAVENOUS | Status: DC

## 2017-04-01 MED ORDER — FUROSEMIDE 10 MG/ML IJ SOLN
20.0000 mg | Freq: Once | INTRAMUSCULAR | Status: AC
  Administered 2017-04-02: 20 mg via INTRAVENOUS
  Filled 2017-04-01: qty 2

## 2017-04-01 MED ORDER — LIDOCAINE HCL 1 % IJ SOLN
INTRAMUSCULAR | Status: AC
  Filled 2017-04-01: qty 20

## 2017-04-01 MED ORDER — DIPHENHYDRAMINE HCL 25 MG PO CAPS
25.0000 mg | ORAL_CAPSULE | Freq: Once | ORAL | Status: AC
  Administered 2017-04-01: 25 mg via ORAL
  Filled 2017-04-01: qty 1

## 2017-04-01 MED ORDER — ENOXAPARIN SODIUM 40 MG/0.4ML ~~LOC~~ SOLN: 40.0000 mg | SUBCUTANEOUS | 0 refills | Status: DC

## 2017-04-01 MED ORDER — SODIUM CHLORIDE 0.9% FLUSH
10.0000 mL | Freq: Two times a day (BID) | INTRAVENOUS | Status: DC
  Administered 2017-04-02 – 2017-04-03 (×4): 10 mL

## 2017-04-01 NOTE — Progress Notes (Signed)
OT Cancellation    04/01/17 0700  OT Visit Information  Last OT Received On 04/01/17  Reason Eval/Treat Not Completed Patient at procedure or test/ unavailable (Per RN, pt about to be transported for [facility] line. Request to come back for evaluation after PIC line placed. Will return as schedule allows.)   [facility], OTR/L [phone]

## 2017-04-01 NOTE — Evaluation (Signed)
Physical Therapy Evaluation Patient Details Name: Heather [name] MRN: [id number] DOB: 1951/03/22 Today's Date: 04/01/2017   History of Present Illness  66 y.o. female who sustained a mechanical fall in her kitchen. Pt with distal femur fx and underwent ORIF at her distal femur on 03/31/17. PMH of poorly-controlled DM, HTN, HLD, and stage III CKD.  Pt reports multiple back surgeries.  Clinical Impression  Pt admitted with above. Pt with increased lethargy requiring multiple v/c's to maintain eye opening. Pt with R LE NWB however unable to maintain. Pt currently requires maxAx2 for OOB mobility. Pt to benefit from SNF upon d/c to achieve safe mod I level of function for safe transition home.    Follow Up Recommendations SNF;Supervision/Assistance - 24 hour    Equipment Recommendations  Rolling walker with 5" wheels    Recommendations for Other Services       Precautions / Restrictions Precautions Precautions: Fall Restrictions Weight Bearing Restrictions: Yes RLE Weight Bearing: Non weight bearing Other Position/Activity Restrictions: Pt with poor recall and adherance to WB precautions throughout session      Mobility  Bed Mobility Overal bed mobility: Needs Assistance Bed Mobility: Sit to Supine     Supine to sit:  (pt up in chair) Sit to supine: Min assist;Mod assist   General bed mobility comments: pt initiated transfer back to supine but required minA for LE management back into bed and then modAx2 to scoot to [facility]  Transfers Overall transfer level: Needs assistance Equipment used: Rolling walker (2 wheeled) Transfers: Sit to/from [facility] Sit to Stand: Total assist;+2 physical assistance;From elevated surface Stand pivot transfers: Total assist;+2 physical assistance;+2 safety/equipment;From elevated surface       General transfer comment: pt unable to maintain R LE NWB requiring assist. pt unable to use R UE functionally to push up from chair.  Pt maxA to achieve standing and pivot on L foot to bed  Ambulation/Gait             General Gait Details: unable  Stairs            Wheelchair Mobility    Modified Rankin (Stroke Patients Only)       Balance Overall balance assessment: Needs assistance;History of Falls Sitting-balance support: Bilateral upper extremity supported;Feet supported Sitting balance-Leahy Scale: Fair     Standing balance support: Bilateral upper extremity supported;During functional activity Standing balance-Leahy Scale: Zero Standing balance comment: Pt requires Max physical A and reliant on UE for support                             Pertinent Vitals/Pain Pain Assessment: Faces Faces Pain Scale: Hurts even more Pain Location: RLE Pain Descriptors / Indicators: Constant;Grimacing;Guarding;Moaning Pain Intervention(s): Monitored during session    Home Living Family/patient expects to be discharged to:: Private residence Living Arrangements: Spouse/significant other Available Help at Discharge: Family;Available 24 hours/day Type of Home: House Home Access: Stairs to enter Entrance Stairs-Rails: Psychiatric nurse of Steps: 4 Home Layout: One level Home Equipment: Bedside commode;Shower seat;Cane - single point      Prior Function Level of Independence: Independent               Hand Dominance   Dominant Hand: Right    Extremity/Trunk Assessment   Upper Extremity Assessment Upper Extremity Assessment: RUE deficits/detail;LUE deficits/detail RUE Deficits / Details: pt with noted abrasion to R elbow. pain limiting active and passive elbow flexion and extension. pt  with point tenderness at lateral elbow RUE:  (Unable to assess due to lethargic behavior) LUE Deficits / Details: generalized weakness    Lower Extremity Assessment Lower Extremity Assessment: RLE deficits/detail;LLE deficits/detail RLE Deficits / Details: unable to move  voluntarily, pt tolerated minimal R knee ROM. to about 30 deg flexion in sitting. pt with no active movement at hip, knee or ankle. pt did wiggle toes LLE Deficits / Details: generalized weakness       Communication   Communication: Other (comment) (Pt with difficult and slurred speech; pt lethargic with meds)  Cognition Arousal/Alertness: Lethargic;Suspect due to medications Behavior During Therapy:  (Slow processing) Overall Cognitive Status: Impaired/Different from baseline Area of Impairment: Attention;Following commands;Safety/judgement;Awareness;Problem solving;Memory;Orientation                 Orientation Level:  (multiple cues for date and president, able to receit fall ) Current Attention Level: Focused Memory: Decreased recall of precautions (unable to recall R LE NWB) Following Commands: Follows one step commands inconsistently;Follows one step commands with increased time Safety/Judgement: Decreased awareness of safety;Decreased awareness of deficits Awareness: Emergent Problem Solving: Slow processing;Decreased initiation;Difficulty sequencing;Requires verbal cues;Requires tactile cues General Comments: pt required v/c's to maintain eye opening and max directional v/c's for all tasks, as well as tactile cues      General Comments General comments (skin integrity, edema, etc.): pt husband present but sleeping. Pt with noted laceration on R lateral elbow    Exercises Other Exercises Other Exercises: passive R knee ROM   Assessment/Plan    PT Assessment Patient needs continued PT services  PT Problem List Decreased strength;Decreased range of motion;Decreased activity tolerance;Decreased balance;Decreased mobility;Decreased knowledge of use of DME;Decreased cognition;Decreased safety awareness;Decreased knowledge of precautions       PT Treatment Interventions DME instruction;Gait training;Stair training;Functional mobility training;Therapeutic  activities;Therapeutic exercise;Balance training    PT Goals (Current goals can be found in the Care Plan section)  Acute Rehab PT Goals Patient Stated Goal: Go home PT Goal Formulation: With patient Time For Goal Achievement: 04/15/17 Potential to Achieve Goals: Good    Frequency Min 3X/week   Barriers to discharge Decreased caregiver support spouse works during the day    Co-evaluation               AM-PAC PT "6 Clicks" Daily Activity  Outcome Measure Difficulty turning over in bed (including adjusting bedclothes, sheets and blankets)?: A Lot Difficulty moving from lying on back to sitting on the side of the bed? : A Lot Difficulty sitting down on and standing up from a chair with arms (e.g., wheelchair, bedside commode, etc,.)?: Total Help needed moving to and from a bed to chair (including a wheelchair)?: Total Help needed walking in hospital room?: Total Help needed climbing 3-5 steps with a railing? : Total 6 Click Score: 8    End of Session Equipment Utilized During Treatment: Gait belt;Oxygen (2LO2 via [place]) Activity Tolerance: Patient tolerated treatment well Patient left: in bed;with call bell/phone within reach;with bed alarm set Nurse Communication: Mobility status PT Visit Diagnosis: Difficulty in walking, not elsewhere classified (R26.2);Pain Pain - Right/Left: Right Pain - part of body: Knee;Leg    Time: 1357-1430 PT Time Calculation (min) (ACUTE ONLY): 33 min   Charges:   PT Evaluation $PT Eval Moderate Complexity: 1 Procedure PT Treatments $Therapeutic Activity: 8-22 mins   PT G Codes:        Kittie Plater, PT, DPT Pager #: [phone] Office #: [phone]   [place] 04/01/2017,  2:49 PM

## 2017-04-01 NOTE — Care Management Note (Addendum)
Case Management Note  Patient Details  Name: Heather [name] MRN: [id number] Date of Birth: Mar 04, 1951  Subjective/Objective:   Distal femur fx, ORIF at her distal femur on 03/31/2017                 Action/Plan: Discharge Planning: NCM spoke to pt and agreeable to SNF-rehab. CSW referral for SNF. [facility] notified of rehab at [facility].   PCP [facility], ASHISH MD  Expected Discharge Date:                Expected Discharge Plan:  [place]  In-House Referral:  Clinical Social Work  Discharge planning Services  CM Consult  Post Acute Care Choice:  NA Choice offered to:  NA  DME Arranged:  N/A DME Agency:  NA  HH Arranged:  NA HH Agency:  NA  Status of Service:  Completed, signed off  If discussed at [place] of Stay Meetings, dates discussed:    Additional Comments:  Erenest Rasher, RN 04/01/2017, 2:26 PM

## 2017-04-01 NOTE — Evaluation (Signed)
Occupational Therapy Evaluation Patient Details Name: Heather [name] MRN: [id number] DOB: 12/23/1950 Today's Date: 04/01/2017    History of Present Illness 66 y.o. female who sustained a mechanical fall in her kitchen. Pt with distal femur fx and underwent ORIF at her distal femur on 03/31/17. PMH of poorly-controlled DM, HTN, HLD, and stage III CKD. Pt reports multiple back surgeries.   Clinical Impression   PTA, pt was independent with ADLs and IADLs and living with her husband. Currently, pt requires Max A for LB ADLs and Max A- Total A +2 for functional transfers. Pt demonstrating poor recall and adherence to WB precautions throughout session. Pt would benefit from acute OT to increase her safety and independence with ADLs and functional mobility.  Recommend dc to SNF for further OT due to pt's perform functional performance and adherence to WB status. However, pt states she does not want to dc to SNF and states "I am going to do that home program" in reference to HHOT/HHPT.     Follow Up Recommendations  SNF;Supervision/Assistance - 24 hour ; Pt wants HHOT/HHPT instead of SNF   Equipment Recommendations  Tub/shower bench    Recommendations for Other Services PT consult     Precautions / Restrictions Precautions Precautions: Fall Restrictions Weight Bearing Restrictions: Yes RLE Weight Bearing: Non weight bearing Other Position/Activity Restrictions: Pt with poor recall and adherance to WB precautions throughout session      Mobility Bed Mobility Overal bed mobility: Needs Assistance Bed Mobility: Supine to Sit     Supine to sit: Mod assist;HOB elevated     General bed mobility comments: Mod A for managing RLE and transitioning hips to EOB. Pt able to push up on LUE with HOB elevated  Transfers Overall transfer level: Needs assistance Equipment used: Rolling walker (2 wheeled) Transfers: Sit to/from [facility] Sit to Stand: Max assist;Total  assist;+2 physical assistance;From elevated surface Stand pivot transfers: Max assist;Total assist;+2 physical assistance;+2 safety/equipment;From elevated surface       General transfer comment: Pt with poor awareness and adherance to WB precautions    Balance Overall balance assessment: Needs assistance;History of Falls Sitting-balance support: Bilateral upper extremity supported;Feet supported Sitting balance-Leahy Scale: Fair     Standing balance support: Bilateral upper extremity supported;During functional activity Standing balance-Leahy Scale: Zero Standing balance comment: Pt requires Max physical A and reliant on UE for support                           ADL either performed or assessed with clinical judgement   ADL Overall ADL's : Needs assistance/impaired Eating/Feeding: Set up;Sitting   Grooming: Min guard;Sitting   Upper Body Bathing: Minimal assistance;Sitting   Lower Body Bathing: Sit to/from stand;Maximal assistance;+2 for physical assistance;+2 for safety/equipment   Upper Body Dressing : Minimal assistance;Sitting   Lower Body Dressing: Maximal assistance;+2 for physical assistance;+2 for safety/equipment;Sit to/from stand   Toilet Transfer: Maximal assistance;Total assistance;+2 for physical assistance;+2 for safety/equipment;Stand-pivot;RW (simulated to recliner) Toilet Transfer Details (indicate cue type and reason): Pt had poor WB adherance during transfers           General ADL Comments: Pt demosntrating poor adherance to WB precautions and was lethargic throughout session.      Vision         Perception     Praxis      Pertinent Vitals/Pain Pain Assessment: Faces Faces Pain Scale: Hurts even more Pain Location: RLE Pain Descriptors / Indicators:  Constant;Grimacing;Guarding;Moaning Pain Intervention(s): Monitored during session;Limited activity within patient's tolerance;Repositioned;Relaxation;Ice applied     Hand Dominance  Right   Extremity/Trunk Assessment Upper Extremity Assessment Upper Extremity Assessment: Generalized weakness;RUE deficits/detail RUE Deficits / Details: Pt reports rotator cuff surgery on R shoulder; pt reports this shoulder hurts with movement  RUE:  (Unable to assess due to lethargic behavior)   Lower Extremity Assessment Lower Extremity Assessment: Defer to PT evaluation       Communication Communication Communication: Other (comment) (Pt with difficult and slurred speech; pt lethargic with meds)   Cognition Arousal/Alertness: Lethargic;Suspect due to medications Behavior During Therapy: [facility] for tasks assessed/performed (Slow processing) Overall Cognitive Status: Impaired/Different from baseline Area of Impairment: Attention;Following commands;Safety/judgement;Awareness;Problem solving                   Current Attention Level: Sustained   Following Commands: Follows one step commands inconsistently;Follows one step commands with increased time Safety/Judgement: Decreased awareness of safety;Decreased awareness of deficits Awareness: Emergent Problem Solving: Slow processing;Decreased initiation;Difficulty sequencing;Requires verbal cues;Requires tactile cues General Comments: Pt having difficulty follow commands and required  Mod-Max cues to perform tasks. Notified RN.    General Comments  pt husband present for session. Pt SpO2 ~96-94 throughout session on 2L O2    Exercises     Shoulder Instructions      Home Living Family/patient expects to be discharged to:: Private residence Living Arrangements: Spouse/significant other Available Help at Discharge: Family;Available 24 hours/day Type of Home: House Home Access: Stairs to enter [employer] of Steps: 4 Entrance Stairs-Rails: Right;Left Home Layout: One level     Bathroom Shower/Tub: Teacher, early years/pre: Standard     Home Equipment: Bedside commode;Shower seat;Cane - single  point          Prior Functioning/Environment Level of Independence: Independent                 OT Problem List: Decreased strength;Decreased range of motion;Decreased activity tolerance;Impaired balance (sitting and/or standing);Decreased cognition;Decreased safety awareness;Decreased knowledge of use of DME or AE;Decreased knowledge of precautions;Pain      OT Treatment/Interventions: Self-care/ADL training;Therapeutic exercise;Energy conservation;DME and/or AE instruction;Therapeutic activities;Patient/family education    OT Goals(Current goals can be found in the care plan section) Acute Rehab OT Goals Patient Stated Goal: Go home OT Goal Formulation: With patient Time For Goal Achievement: 04/15/17 Potential to Achieve Goals: Good ADL Goals Pt Will Perform Lower Body Bathing: with adaptive equipment;sit to/from stand;with mod assist Pt Will Perform Upper Body Dressing: with supervision;with set-up;sitting Pt Will Perform Lower Body Dressing: with mod assist;with adaptive equipment;sit to/from stand Pt Will Transfer to Toilet: with mod assist;stand pivot transfer;bedside commode Pt Will Perform Toileting - Clothing Manipulation and hygiene: sit to/from stand;with mod assist  OT Frequency: Min 2X/week   Barriers to D/C:            Co-evaluation              AM-PAC PT "6 Clicks" Daily Activity     Outcome Measure Help from another person eating meals?: None Help from another person taking care of personal grooming?: A Little Help from another person toileting, which includes using toliet, bedpan, or urinal?: A Lot Help from another person bathing (including washing, rinsing, drying)?: A Lot Help from another person to put on and taking off regular upper body clothing?: A Lot Help from another person to put on and taking off regular lower body clothing?: A Lot 6 Click Score: 15  End of Session Equipment Utilized During Treatment: Gait belt;Rolling  walker;Oxygen Nurse Communication: Mobility status;Weight bearing status;Precautions  Activity Tolerance: Patient limited by pain;Patient limited by lethargy Patient left: in chair;with call bell/phone within reach;with family/visitor present  OT Visit Diagnosis: Unsteadiness on feet (R26.81);Repeated falls (R29.6);Muscle weakness (generalized) (M62.81);History of falling (Z91.81);Pain Pain - Right/Left: Right Pain - part of body: Leg                Time: 9802-2179 OT Time Calculation (min): 34 min Charges:  OT General Charges $OT Visit: 1 Procedure OT Evaluation $OT Eval Moderate Complexity: 1 Procedure OT Treatments $Self Care/Home Management : 8-22 mins G-Codes:     [employer], OTR/L [place] 04/01/2017, 1:48 PM

## 2017-04-01 NOTE — Progress Notes (Signed)
Initial Nutrition Assessment  DOCUMENTATION CODES:   Obesity unspecified  INTERVENTION:  Provide Glucerna Shake po BID, each supplement provides 220 kcal and 10 grams of protein.  Encourage adequate PO intake.   NUTRITION DIAGNOSIS:   Inadequate oral intake related to poor appetite as evidenced by per patient/family report.  GOAL:   Patient will meet greater than or equal to 90% of their needs  MONITOR:   PO intake, Supplement acceptance, Labs, Weight trends, Skin, I & O's  REASON FOR ASSESSMENT:   Consult Hip fracture protocol  ASSESSMENT:   66 y.o. female with a history of poorly-controlled DM, HTN, HLD, and stage III CKD presenting after a slip and fall at home.  Imaging demonstrated a comminuted distal right femur fracture.  Procedure (6/3): OPEN REDUCTION INTERNAL FIXATION (ORIF) DISTAL FEMUR FRACTURE (Right)  Pt reports having a decreased appetite. She reports experiencing no taste in food. Pt reports she has been able to only take a couple bites out of breakfast this AM however was able to eat crackers and peanut butter just prior to RD visit. Pt reports usually consuming at least 2-3 meals a day at home with a protein shake at least twice daily. Weight has been stable. Pt is agreeable to nutritional supplements to aid in caloric and protein needs. RD to order Glucerna shake. Pt educated to continue nutritional supplementation at home especially if po intake is poor.   Pt with no observed significant fat or muscle mass loss.   Labs and medications reviewed.   Diet Order:  Diet Carb Modified Fluid consistency: Thin; Room service appropriate? Yes  Skin:   (Incision R leg)  Last BM:  6/2  Height:   Ht Readings from Last 1 Encounters:  03/30/17 5\' 2"  (1.575 m)    Weight:   Wt Readings from Last 1 Encounters:  03/30/17 164 lb (74.4 kg)    Ideal Body Weight:  50 kg  BMI:  Body mass index is 30 kg/m.  Estimated Nutritional Needs:   Kcal:   1700-1900  Protein:  85-95 grams  Fluid:  1.7 - 1.9 L/day  EDUCATION NEEDS:   Education needs addressed  Corrin Parker, MS, RD, LDN Pager # [phone] After hours/ weekend pager # [phone]

## 2017-04-01 NOTE — Procedures (Signed)
RIJV PICC 15 cm SVC RA EBL 0 Comp 0

## 2017-04-01 NOTE — Progress Notes (Addendum)
Patient ID: Heather [name], female   DOB: [birth year], 66 y.o.   MRN: [id number] Subjective: 1 Day Post-Op Procedure(s) (LRB): OPEN REDUCTION INTERNAL FIXATION (ORIF) DISTAL FEMUR FRACTURE (Right)    Patient reports pain as moderate.  Resting comfortably this am.  No events, no OOB activity yet  Objective:   VITALS:   Vitals:   04/01/17 0024 04/01/17 0349  BP: (!) 134/54 130/60  Pulse: 90 94  Resp: 18 18  Temp: 98.3 F (36.8 C) 98.9 F (37.2 C)    Neurovascular intact Incision: dressing C/D/I  LABS  Recent Labs  03/30/17 1313 03/31/17 0500 04/01/17 0428  HGB 10.0* 10.4* 8.0*  HCT 31.0* 33.6* 25.6*  WBC 12.9* 19.5* 10.8*  PLT 253 325 214     Recent Labs  03/30/17 1313 03/31/17 0500 04/01/17 0428  NA 131* 136 136  K 3.8 4.1 3.8  BUN 37* 28* 25*  CREATININE 2.49* 2.04* 1.87*  GLUCOSE 399* 173* 154*    No results for input(s): LABPT, INR in the last 72 hours.   Assessment/Plan: 1 Day Post-Op Procedure(s) (LRB): OPEN REDUCTION INTERNAL FIXATION (ORIF) DISTAL FEMUR FRACTURE (Right)   Advance diet Up with therapy  NWB RLE until further directed, ROM of knee encouraged to prevent stiffness Discharge to SNF probably due to weight bearing restrictions ACE wrap can be removed tomorrow RTC in 2 weeks for wound check and X-rays

## 2017-04-01 NOTE — Progress Notes (Signed)
PROGRESS NOTE    Heather [name]  LKT:[id number] DOB: 1951/06/14 DOA: 03/30/2017 PCP: [name], MD    Brief Narrative:  Patient is a 66 year old female history of type 2 diabetes, hypertension, hyperlipidemia, chronic kidney disease stage III presenting after a fall with comminuted distal right femur fracture. Patient also noted to be in acute on chronic kidney disease stage III. Patients in consultation by orthopedics and patient underwent ORIF 03/31/2017.   Assessment & Plan:   Principal Problem:   Femur fracture, right (HCC) Active Problems:   Irritable bowel syndrome   Vitamin B12 deficiency   Chronic kidney disease, stage III (moderate)   Essential hypertension   Diabetes mellitus with chronic kidney disease (HCC)   GERD (gastroesophageal reflux disease)   Mixed hyperlipidemia   Depression   Acute renal failure superimposed on stage 3 chronic kidney disease (HCC)   Hip fracture (HCC)  #1 closed right distal periprosthetic femur fracture Secondary to mechanical fall. Status post ORIF per Dr. Alvan Dame 03/31/2017. PT/OT. Likely needs skilled nursing facility.  #2 acute on chronic kidney disease stage III Likely secondary to prerenal renal azotemia in the setting of diuretics. Renal function improving. Lasix on hold. Urinalysis nitrite negative, leukocyte negative. Renal ultrasound negative for hydronephrosis. NSL IVF.  #3 diabetes mellitus Hemoglobin A1c 8.9 03/07/2017. Continue 1/2 home dose levemir and titrate once patient is tolerating diet. Sliding scale insulin. Reglan.  #4 gastroesophageal reflux disease Continue PPI.  #5 hypertension Continue Lopressor.  #6 depression Stable. Continue Celexa.  #7 hyperlipidemia Continue statin.  #8 IBS Stable. Outpatient follow-up.  #9 postop acute blood loss anemia Hemoglobin this morning 8.0 from 10.4 03/31/2017. Patient with no overt bleeding. Anemia panel consistent with anemia of chronic disease. Repeat H&H. If  hemoglobin less than 8 will transfuse 2 units packed red blood cells.      DVT prophylaxis: Lovenox Code Status: Full Family Communication: Updated patient and husband at bedside. Disposition Plan: Likely skilled nursing facility hopefully 04/02/2017   Consultants:   Orthopedics: Dr. Alvan Dame 03/31/2017  Procedures:   Chest x-ray 03/31/2017  Plain films right elbow 03/30/2017  Plain films right knee 03/30/2017  Renal ultrasound 03/31/2017  ORIF right distal periprosthetic fracture 03/31/2017 per Dr. Marshia Ly PICC line placed on the fluoroscopy per IR 04/01/2017  Antimicrobials:  None   Subjective: Patient sitting up in chair. No chest pain. No shortness of breath.  Objective: Vitals:   03/31/17 1900 03/31/17 1956 04/01/17 0024 04/01/17 0349  BP: (!) 160/60 (!) 150/93 (!) 134/54 130/60  Pulse:  97 90 94  Resp:  18 18 18   Temp:  99.2 F (37.3 C) 98.3 F (36.8 C) 98.9 F (37.2 C)  TempSrc:  Oral Oral Oral  SpO2:  97% 95% 96%  Weight:      Height:        Intake/Output Summary (Last 24 hours) at 04/01/17 1225 Last data filed at 04/01/17 0414  Gross per 24 hour  Intake              100 ml  Output              700 ml  Net             -600 ml   Filed Weights   03/30/17 1224  Weight: 74.4 kg (164 lb)    Examination:  General exam: NAD Respiratory system: Some bibasilar crackles. Respiratory effort normal. Cardiovascular system: S1 & S2 heard, RRR. No JVD, murmurs, rubs, gallops or clicks.  No pedal edema. Gastrointestinal system: Abdomen is nondistended, soft and nontender. No organomegaly or masses felt. Normal bowel sounds heard. Central nervous system: Alert and oriented. No focal neurological deficits. Extremities: Symmetric 5 x 5 power. Skin: No rashes, lesions or ulcers Psychiatry: Judgement and insight appear fair. Mood & affect appropriate.     Data Reviewed: I have personally reviewed following labs and imaging studies  CBC:  Recent  Labs Lab 03/30/17 1313 03/31/17 0500 04/01/17 0428  WBC 12.9* 19.5* 10.8*  NEUTROABS 10.2*  --   --   HGB 10.0* 10.4* 8.0*  HCT 31.0* 33.6* 25.6*  MCV 86.6 90.8 89.8  PLT 253 325 440   Basic Metabolic Panel:  Recent Labs Lab 03/30/17 1313 03/31/17 0500 04/01/17 0428  NA 131* 136 136  K 3.8 4.1 3.8  CL 98* 105 105  CO2 20* 19* 22  GLUCOSE 399* 173* 154*  BUN 37* 28* 25*  CREATININE 2.49* 2.04* 1.87*  CALCIUM 9.3 9.1 8.9   GFR: Estimated Creatinine Clearance: 28.7 mL/min (A) (by C-G formula based on SCr of 1.87 mg/dL (H)). Liver Function Tests:  Recent Labs Lab 03/31/17 0500  ALBUMIN 3.4*   No results for input(s): LIPASE, AMYLASE in the last 168 hours. No results for input(s): AMMONIA in the last 168 hours. Coagulation Profile: No results for input(s): INR, PROTIME in the last 168 hours. Cardiac Enzymes:  Recent Labs Lab 03/30/17 1313 03/31/17 0500  CKTOTAL 176 185   BNP (last 3 results) No results for input(s): PROBNP in the last 8760 hours. HbA1C: No results for input(s): HGBA1C in the last 72 hours. CBG:  Recent Labs Lab 03/31/17 1955 04/01/17 0016 04/01/17 0408 04/01/17 0740 04/01/17 1213  GLUCAP 259* 188* 152* 153* 204*   Lipid Profile: No results for input(s): CHOL, HDL, LDLCALC, TRIG, CHOLHDL, LDLDIRECT in the last 72 hours. Thyroid Function Tests: No results for input(s): TSH, T4TOTAL, FREET4, T3FREE, THYROIDAB in the last 72 hours. Anemia Panel:  Recent Labs  04/01/17 0428  VITAMINB12 1,493*  FOLATE 10.3  FERRITIN 1,829*  TIBC 318  IRON 34   Sepsis Labs: No results for input(s): PROCALCITON, LATICACIDVEN in the last 168 hours.  Recent Results (from the past 240 hour(s))  Surgical pcr screen     Status: None   Collection Time: 03/30/17 10:20 PM  Result Value Ref Range Status   MRSA, PCR NEGATIVE NEGATIVE Final   Staphylococcus aureus NEGATIVE NEGATIVE Final    Comment:        The Xpert SA Assay (FDA approved for NASAL  specimens in patients over 33 years of age), is one component of a comprehensive surveillance program.  Test performance has been validated by [facility] for patients greater than or equal to 39 year old. It is not intended to diagnose infection nor to guide or monitor treatment.          Radiology Studies: Dg Elbow Complete Right  Result Date: 03/30/2017 CLINICAL DATA:  Fall.  Pain. EXAM: RIGHT ELBOW - COMPLETE 3+ VIEW COMPARISON:  Although images are unavailable, the report for a study from 01/13/2014 has been reviewed. FINDINGS: Anterior fat pad slightly prominent but not frankly elevated. Nonvisualization posterior fat pad. No evidence for an acute fracture. No evidence for subluxation or dislocation. No worrisome lytic or sclerotic osseous abnormality. IMPRESSION: Negative. Electronically Signed   By: Misty Stanley M.D.   On: 03/30/2017 15:19   US Renal  Result Date: 03/31/2017 CLINICAL DATA:  Acute renal failure. History of diabetes,  hypertension and chronic stage 3 kidney disease. EXAM: RENAL / URINARY TRACT ULTRASOUND COMPLETE COMPARISON:  Abdominal MRI -01/18/2017 FINDINGS: Examination is degraded due to patient body habitus and poor sonographic when Right Kidney: Normal cortical thickness, echogenicity and size, measuring 9.8 cm in length. No focal renal lesions. No echogenic renal stones. No urinary obstruction. Left Kidney: Normal cortical thickness, echogenicity and size, measuring 10.2 cm in length. Note is made of a punctate (approximately 0.8 x 0.4 x 0.7 cm anechoic partially exophytic cystic lesion arising from the inferior pole of the left kidney. No echogenic renal stones. No urinary obstruction. Bladder: Appears normal for degree of bladder distention. IMPRESSION: 1. No explanation for patient's acute renal failure. Specifically, no evidence of urinary obstruction or evidence of significant renal atrophy. 2. Punctate subcentimeter anechoic lesion arising from the inferior  pole of the left kidney, too small to accurately characterize though favored to represent a renal cyst. Electronically Signed   By: Sandi Mariscal M.D.   On: 03/31/2017 10:03   Ir Fluoro Guide Cv Line Right  Result Date: 04/01/2017 INDICATION: Cellulitis EXAM: RIGHT IJ VEIN PICC LINE PLACEMENT WITH ULTRASOUND AND FLUOROSCOPIC GUIDANCE MEDICATIONS: None ANESTHESIA/SEDATION: None FLUOROSCOPY TIME:  Fluoroscopy Time:  minutes 18 seconds (1 mGy). COMPLICATIONS: None immediate. PROCEDURE: The patient was advised of the possible risks and complications and agreed to undergo the procedure. The patient was then brought to the angiographic suite for the procedure. The right neck was prepped with chlorhexidine, draped in the usual sterile fashion using maximum barrier technique (cap and mask, sterile gown, sterile gloves, large sterile sheet, hand hygiene and cutaneous antiseptic). Local anesthesia was attained by infiltration with 1% lidocaine. Ultrasound demonstrated patency of the jugular vein, and this was documented with an image. Under real-time ultrasound guidance, this vein was accessed with a 21 gauge micropuncture needle and image documentation was performed. The needle was exchanged over a guidewire for a peel-away sheath through which a 15 cm 5 [place] double lumen power injectable PICC was advanced, and positioned with its tip at the lower SVC/right atrial junction. Fluoroscopy during the procedure and fluoro spot radiograph confirms appropriate catheter position. The catheter was flushed, secured to the skin with Prolene sutures, and covered with a sterile dressing. IMPRESSION: Successful placement of a right IJ PICC with sonographic and fluoroscopic guidance. The catheter is ready for use. Electronically Signed   By: Marybelle Killings M.D.   On: 04/01/2017 09:21   Ir US Guide Vasc Access Right  Result Date: 04/01/2017 INDICATION: Cellulitis EXAM: RIGHT IJ VEIN PICC LINE PLACEMENT WITH ULTRASOUND AND FLUOROSCOPIC  GUIDANCE MEDICATIONS: None ANESTHESIA/SEDATION: None FLUOROSCOPY TIME:  Fluoroscopy Time:  minutes 18 seconds (1 mGy). COMPLICATIONS: None immediate. PROCEDURE: The patient was advised of the possible risks and complications and agreed to undergo the procedure. The patient was then brought to the angiographic suite for the procedure. The right neck was prepped with chlorhexidine, draped in the usual sterile fashion using maximum barrier technique (cap and mask, sterile gown, sterile gloves, large sterile sheet, hand hygiene and cutaneous antiseptic). Local anesthesia was attained by infiltration with 1% lidocaine. Ultrasound demonstrated patency of the jugular vein, and this was documented with an image. Under real-time ultrasound guidance, this vein was accessed with a 21 gauge micropuncture needle and image documentation was performed. The needle was exchanged over a guidewire for a peel-away sheath through which a 15 cm 5 [place] double lumen power injectable PICC was advanced, and positioned with its tip at the lower SVC/right  atrial junction. Fluoroscopy during the procedure and fluoro spot radiograph confirms appropriate catheter position. The catheter was flushed, secured to the skin with Prolene sutures, and covered with a sterile dressing. IMPRESSION: Successful placement of a right IJ PICC with sonographic and fluoroscopic guidance. The catheter is ready for use. Electronically Signed   By: Marybelle Killings M.D.   On: 04/01/2017 09:21   Chest Portable 1 View  Result Date: 03/31/2017 CLINICAL DATA:  ORIF of distal femur fracture - preoperative examination. EXAM: PORTABLE CHEST 1 VIEW COMPARISON:  12/25/2016 FINDINGS: Grossly unchanged cardiac silhouette and mediastinal contours with thickening of the right paratracheal stripe presumably secondary to prominent vasculature. No focal airspace opacities. No pleural effusion or pneumothorax. No evidence of edema. No acute osseus abnormalities. Post lower cervical  ACDF and paraspinal fusion, incompletely evaluated. Old right clavicular fracture. IMPRESSION: No acute cardiopulmonary disease. Electronically Signed   By: Sandi Mariscal M.D.   On: 03/31/2017 10:05   Dg Knee Complete 4 Views Right  Result Date: 03/30/2017 CLINICAL DATA:  Patient fell.  Pain. EXAM: RIGHT KNEE - COMPLETE 4+ VIEW COMPARISON:  None. FINDINGS: Status post tricompartmental knee replacement. Comminuted fracture of the distal femoral metaphysis is identified with mild apex anterior angulation. Bones are diffusely demineralized. IMPRESSION: Comminuted fracture distal femoral metaphysis in this patient status post tricompartmental knee replacement. Electronically Signed   By: Misty Stanley M.D.   On: 03/30/2017 15:17   Dg C-arm 1-60 Min  Result Date: 03/31/2017 CLINICAL DATA:  Right femoral retrograde nail EXAM: DG C-ARM 61-120 MIN; RIGHT FEMUR 2 VIEWS FLUOROSCOPY TIME:  1 minute, 18 seconds COMPARISON:  Right knee radiographs -03/31/2027 FINDINGS: Four spot intraoperative fluoroscopic images of the right femur are provided for review Images demonstrate the sequela of intramedullary rod fixation of previously noted comminuted distal femoral metaphyseal fracture. The distal end of the femoral rod is transfixed with 4 cancellous screws while the proximal end is transfixed with a single cancellous screw. Alignment appears improved. Expected adjacent subcutaneous emphysema. No radiopaque foreign body. Stable sequela of right total knee replacement, incompletely evaluated. IMPRESSION: Post intramedullary rod fixation of distal femoral metaphyseal fracture without evidence of complication. Electronically Signed   By: Sandi Mariscal M.D.   On: 03/31/2017 12:09   Dg Hip Unilat W Or Wo Pelvis 2-3 Views Right  Result Date: 03/30/2017 CLINICAL DATA:  Acute right hip pain following fall last night. Initial encounter. EXAM: DG HIP (WITH OR WITHOUT PELVIS) 2-3V RIGHT COMPARISON:  01/29/2014 FINDINGS: There is no  evidence of acute fracture subluxation. Mild degenerative changes in the hips again noted. No focal bony lesions are present. IMPRESSION: No acute abnormality. Electronically Signed   By: Margarette Canada M.D.   On: 03/30/2017 15:20   Dg Femur, Min 2 Views Right  Result Date: 03/31/2017 CLINICAL DATA:  Right femoral retrograde nail EXAM: DG C-ARM 61-120 MIN; RIGHT FEMUR 2 VIEWS FLUOROSCOPY TIME:  1 minute, 18 seconds COMPARISON:  Right knee radiographs -03/31/2027 FINDINGS: Four spot intraoperative fluoroscopic images of the right femur are provided for review Images demonstrate the sequela of intramedullary rod fixation of previously noted comminuted distal femoral metaphyseal fracture. The distal end of the femoral rod is transfixed with 4 cancellous screws while the proximal end is transfixed with a single cancellous screw. Alignment appears improved. Expected adjacent subcutaneous emphysema. No radiopaque foreign body. Stable sequela of right total knee replacement, incompletely evaluated. IMPRESSION: Post intramedullary rod fixation of distal femoral metaphyseal fracture without evidence of complication. Electronically Signed  By: Sandi Mariscal M.D.   On: 03/31/2017 12:09        Scheduled Meds: . allopurinol  100 mg Oral Daily  . atorvastatin  80 mg Oral q1800  . cholecalciferol  1,000 Units Oral Daily  . citalopram  40 mg Oral Daily  . enoxaparin (LOVENOX) injection  40 mg Subcutaneous Q24H  . ferrous sulfate  325 mg Oral BID WC  . insulin aspart  0-20 Units Subcutaneous Q4H  . insulin detemir  40 Units Subcutaneous QHS  . lidocaine      . metoCLOPramide  10 mg Oral TID AC  . metoprolol tartrate  100 mg Oral BID  . pantoprazole  80 mg Oral Q1200  . senna  1 tablet Oral BID   Continuous Infusions: . sodium chloride 100 mL/hr at 04/01/17 0942  . methocarbamol (ROBAXIN)  IV       LOS: 2 days    Time spent: 11 minutes    THOMPSON,DANIEL, MD [facility] Pager  [phone]  If 7PM-7AM, please contact night-coverage [email or web address] Password [facility] 04/01/2017, 12:25 PM

## 2017-04-02 ENCOUNTER — Inpatient Hospital Stay (HOSPITAL_COMMUNITY): Payer: Medicare Other

## 2017-04-02 DIAGNOSIS — K219 Gastro-esophageal reflux disease without esophagitis: Secondary | ICD-10-CM

## 2017-04-02 DIAGNOSIS — Z419 Encounter for procedure for purposes other than remedying health state, unspecified: Secondary | ICD-10-CM

## 2017-04-02 LAB — BASIC METABOLIC PANEL
ANION GAP: 11 (ref 5–15)
BUN: 27 mg/dL — AB (ref 6–20)
CO2: 24 mmol/L (ref 22–32)
Calcium: 8.8 mg/dL — ABNORMAL LOW (ref 8.9–10.3)
Chloride: 100 mmol/L — ABNORMAL LOW (ref 101–111)
Creatinine, Ser: 2.23 mg/dL — ABNORMAL HIGH (ref 0.44–1.00)
GFR calc Af Amer: 26 mL/min — ABNORMAL LOW (ref >60)
GFR calc non Af Amer: 22 mL/min — ABNORMAL LOW (ref >60)
GLUCOSE: 171 mg/dL — AB (ref 65–99)
POTASSIUM: 3.7 mmol/L (ref 3.5–5.1)
Sodium: 135 mmol/L (ref 135–145)

## 2017-04-02 LAB — CBC
HEMATOCRIT: 28.3 % — AB (ref 36.0–46.0)
Hemoglobin: 9.2 g/dL — ABNORMAL LOW (ref 12.0–15.0)
MCH: 28 pg (ref 26.0–34.0)
MCHC: 32.5 g/dL (ref 30.0–36.0)
MCV: 86.3 fL (ref 78.0–100.0)
Platelets: 142 10*3/uL — ABNORMAL LOW (ref 150–400)
RBC: 3.28 MIL/uL — AB (ref 3.87–5.11)
RDW: 16.3 % — ABNORMAL HIGH (ref 11.5–15.5)
WBC: 9.3 10*3/uL (ref 4.0–10.5)

## 2017-04-02 LAB — TROPONIN I: TROPONIN I: 0.04 ng/mL — AB (ref <0.03)

## 2017-04-02 LAB — GLUCOSE, CAPILLARY
GLUCOSE-CAPILLARY: 147 mg/dL — AB (ref 65–99)
GLUCOSE-CAPILLARY: 166 mg/dL — AB (ref 65–99)
Glucose-Capillary: 152 mg/dL — ABNORMAL HIGH (ref 65–99)
Glucose-Capillary: 165 mg/dL — ABNORMAL HIGH (ref 65–99)
Glucose-Capillary: 176 mg/dL — ABNORMAL HIGH (ref 65–99)
Glucose-Capillary: 209 mg/dL — ABNORMAL HIGH (ref 65–99)

## 2017-04-02 LAB — ABO/RH: ABO/RH(D): O POS

## 2017-04-02 LAB — VITAMIN D 25 HYDROXY (VIT D DEFICIENCY, FRACTURES): Vit D, 25-Hydroxy: 23.1 ng/mL — ABNORMAL LOW (ref 30.0–100.0)

## 2017-04-02 MED ORDER — ENOXAPARIN SODIUM 30 MG/0.3ML ~~LOC~~ SOLN: 30.0000 mg | SUBCUTANEOUS | 0 refills | Status: DC

## 2017-04-02 MED ORDER — SIMETHICONE 80 MG PO CHEW
160.0000 mg | CHEWABLE_TABLET | Freq: Four times a day (QID) | ORAL | Status: DC
  Administered 2017-04-02 – 2017-04-03 (×5): 160 mg via ORAL
  Filled 2017-04-02 (×5): qty 2

## 2017-04-02 MED ORDER — PANTOPRAZOLE SODIUM 40 MG PO TBEC
40.0000 mg | DELAYED_RELEASE_TABLET | Freq: Every day | ORAL | Status: DC
  Administered 2017-04-03: 40 mg via ORAL
  Filled 2017-04-02: qty 1

## 2017-04-02 MED ORDER — GI COCKTAIL ~~LOC~~
30.0000 mL | Freq: Three times a day (TID) | ORAL | Status: DC | PRN
  Filled 2017-04-02 (×2): qty 30

## 2017-04-02 MED ORDER — ACETAMINOPHEN 500 MG PO TABS
500.0000 mg | ORAL_TABLET | Freq: Three times a day (TID) | ORAL | Status: DC
  Administered 2017-04-02: 500 mg via ORAL
  Filled 2017-04-02 (×3): qty 1

## 2017-04-02 NOTE — NC FL2 (Signed)
[place] LEVEL OF CARE SCREENING TOOL     IDENTIFICATION  Patient Name: Heather [name] Birthdate: 08/13/1951 Sex: female Admission Date (Current Location): 03/30/2017  [facility] and [place] Number:  Herbalist and Address:  [facility] [place] [address], [place], [place] [address]      Provider Number: [id number]  Attending Physician Name and Address:  Eugenie Filler, MD  Relative Name and Phone Number:       Current Level of Care: Hospital Recommended Level of Care: [place] Prior Approval Number:    Date Approved/Denied: 04/02/17 PASRR Number: [id number]  Discharge Plan: SNF    Current Diagnoses: Patient Active Problem List   Diagnosis Date Noted  . Closed fracture of distal end of femur, unspecified fracture morphology, initial encounter ([place])   . Hip fracture ([place])   . Femur fracture, right ([place]) 03/30/2017  . Acute renal failure superimposed on stage 3 chronic kidney disease ([place]) 01/21/2016  . Faintness 04/29/2015  . Awareness alteration, transient 03/02/2015  . Neuropathy 03/02/2015  . Pain in the chest   . Chest pain 12/22/2014  . Essential hypertension 12/22/2014  . Nausea & vomiting 12/22/2014  . Diabetes mellitus with chronic kidney disease ([place]) 12/22/2014  . Hiatal hernia 12/22/2014  . GERD (gastroesophageal reflux disease) 12/22/2014  . Gout 12/22/2014  . Mixed hyperlipidemia 12/22/2014  . Depression 12/22/2014  . Chronic kidney disease, stage III (moderate) 08/25/2014  . Vitamin B12 deficiency 07/08/2011  . Personal history of failed moderate sedation 04/16/2011  . Chronic Nausea and Vomiting - ? gastroapresis 04/16/2011  . Early satiety 04/16/2011  . Irritable bowel syndrome 04/16/2011  . Benign paroxysmal positional vertigo 11/20/2010  . TRICUSPID REGURGITATION, MODERATE 11/20/2010  . PULMONARY HYPERTENSION, MILD 11/20/2010  . LEG EDEMA, BILATERAL 11/20/2010  . DYSPNEA  11/20/2010  . NONSPECIFIC ABNORMAL ELECTROCARDIOGRAM 11/20/2010  . CHEST WALL PAIN, HX OF 11/20/2010    Orientation RESPIRATION BLADDER Height & Weight     Self, Time, Situation  Normal External catheter, Continent Weight: 164 lb (74.4 kg) Height:  5\' 2"  (157.5 cm)  BEHAVIORAL SYMPTOMS/MOOD NEUROLOGICAL BOWEL NUTRITION STATUS      Continent Diet (See DC summary)  AMBULATORY STATUS COMMUNICATION OF NEEDS Skin   Extensive Assist Verbally Surgical wounds (Closed Right Leg Incision with Compression Wrap)                       Personal Care Assistance Level of Assistance  Bathing, Feeding, Dressing Bathing Assistance: Maximum assistance Feeding assistance: Independent Dressing Assistance: Maximum assistance     Functional Limitations Info  Sight, Hearing, Speech Sight Info: Adequate Hearing Info: Adequate Speech Info: Adequate    SPECIAL CARE FACTORS FREQUENCY  OT (By licensed OT), PT (By licensed PT)     PT Frequency: 5xweek OT Frequency: 5xweek            Contractures      Additional Factors Info  Code Status, Allergies Code Status Info: Full Allergies Info: DULOXETINE HCL, FLUOCINOLONE, ACETAMINOPHEN, AMLODIPINE BESYLATE, CIPROFLOXACIN, HYDROCODONE-ACETAMINOPHEN, METFORMIN, NSAIDS, QUINOLONES, SULFAMETHOXAZOLE, SULFONAMIDE DERIVATIVES, VALSARTAN, COZAAR LOSARTAN, LISINOPRIL            Current Medications (04/02/2017):  This is the current hospital active medication list Current Facility-Administered Medications  Medication Dose Route Frequency Provider Last Rate Last Dose  . 0.9 %  sodium chloride infusion   Intravenous Once Eugenie Filler, MD      . allopurinol (ZYLOPRIM) tablet  100 mg  100 mg Oral Daily Vance Gather B, MD   100 mg at 04/02/17 1059  . alum & mag hydroxide-simeth (MAALOX/MYLANTA) 200-200-20 MG/5ML suspension 30 mL  30 mL Oral Q4H PRN Paralee Cancel, MD      . atorvastatin (LIPITOR) tablet 80 mg  80 mg Oral q1800 Patrecia Pour, MD   80 mg at  04/01/17 1734  . bisacodyl (DULCOLAX) suppository 10 mg  10 mg Rectal Daily PRN Patrecia Pour, MD      . cholecalciferol (VITAMIN D) tablet 1,000 Units  1,000 Units Oral Daily Patrecia Pour, MD   1,000 Units at 04/02/17 1059  . citalopram (CELEXA) tablet 20 mg  20 mg Oral Daily Eugenie Filler, MD   20 mg at 04/02/17 1059  . diazepam (VALIUM) tablet 10 mg  10 mg Oral BID PRN Patrecia Pour, MD   10 mg at 04/02/17 0059  . diphenhydrAMINE (BENADRYL) capsule 25 mg  25 mg Oral Q6H PRN Eugenie Filler, MD      . enoxaparin (LOVENOX) injection 30 mg  30 mg Subcutaneous Q24H Jaquita Folds, RPH   30 mg at 04/02/17 0836  . feeding supplement (GLUCERNA SHAKE) (GLUCERNA SHAKE) liquid 237 mL  237 mL Oral BID BM Eugenie Filler, MD   237 mL at 04/02/17 1000  . ferrous sulfate tablet 325 mg  325 mg Oral BID WC Paralee Cancel, MD   325 mg at 04/02/17 0841  . furosemide (LASIX) tablet 40 mg  40 mg Oral Daily Eugenie Filler, MD   40 mg at 04/02/17 1059  . gi cocktail (Maalox,Lidocaine,Donnatal)  30 mL Oral TID PRN Eugenie Filler, MD      . hydrALAZINE (APRESOLINE) injection 10 mg  10 mg Intravenous Q2H PRN Patrecia Pour, MD   10 mg at 03/31/17 1720  . hydrOXYzine (ATARAX/VISTARIL) tablet 25 mg  25 mg Oral TID PRN Eugenie Filler, MD      . insulin aspart (novoLOG) injection 0-20 Units  0-20 Units Subcutaneous Q4H Patrecia Pour, MD   4 Units at 04/02/17 1224  . insulin detemir (LEVEMIR) injection 40 Units  40 Units Subcutaneous QHS Patrecia Pour, MD   40 Units at 04/02/17 0045  . lidocaine (XYLOCAINE) 1 % (with pres) injection   Infiltration PRN Hoss, Arthur, MD   10 mL at 04/01/17 0858  . menthol-cetylpyridinium (CEPACOL) lozenge 3 mg  1 lozenge Oral PRN Paralee Cancel, MD       Or  . phenol (CHLORASEPTIC) mouth spray 1 spray  1 spray Mouth/Throat PRN Paralee Cancel, MD      . methocarbamol (ROBAXIN) tablet 500 mg  500 mg Oral Q6H PRN Eugenie Filler, MD   500 mg at 04/02/17 3825   Or  .  methocarbamol (ROBAXIN) 500 mg in dextrose 5 % 50 mL IVPB  500 mg Intravenous Q6H PRN Eugenie Filler, MD      . metoCLOPramide (REGLAN) tablet 10 mg  10 mg Oral TID AC Patrecia Pour, MD   10 mg at 04/02/17 1224  . metoprolol tartrate (LOPRESSOR) tablet 100 mg  100 mg Oral BID Patrecia Pour, MD   100 mg at 04/02/17 1059  . morphine 4 MG/ML injection 2-4 mg  2-4 mg Intravenous Q2H PRN Ricka Burdock, RPH   4 mg at 04/02/17 1058  . ondansetron (ZOFRAN) injection 4 mg  4 mg Intravenous Q8H PRN Patrecia Pour, MD  4 mg at 03/31/17 1158  . ondansetron (ZOFRAN) tablet 4 mg  4 mg Oral Q6H PRN Paralee Cancel, MD       Or  . ondansetron [facility]) injection 4 mg  4 mg Intravenous Q6H PRN Paralee Cancel, MD      . oxyCODONE (Oxy IR/ROXICODONE) immediate release tablet 5-10 mg  5-10 mg Oral Q4H PRN Paralee Cancel, MD   10 mg at 04/02/17 9528  . [START ON 04/03/2017] pantoprazole (PROTONIX) EC tablet 40 mg  40 mg Oral Q0600 Eugenie Filler, MD      . polyethylene glycol (MIRALAX / GLYCOLAX) packet 17 g  17 g Oral Daily PRN Patrecia Pour, MD      . senna (SENOKOT) tablet 8.6 mg  1 tablet Oral BID Eugenie Filler, MD   8.6 mg at 04/02/17 1059  . simethicone (MYLICON) chewable tablet 160 mg  160 mg Oral QID Eugenie Filler, MD      . sodium chloride flush (NS) 0.9 % injection 10-40 mL  10-40 mL Intracatheter Q12H Eugenie Filler, MD   10 mL at 04/02/17 4132  . sodium chloride flush (NS) 0.9 % injection 10-40 mL  10-40 mL Intracatheter PRN Eugenie Filler, MD      . sorbitol 70 % solution 30 mL  30 mL Oral Daily PRN Eugenie Filler, MD      . tiZANidine (ZANAFLEX) tablet 4 mg  4 mg Oral QHS PRN Eugenie Filler, MD         Discharge Medications: Please see discharge summary for a list of discharge medications.  Relevant Imaging Results:  Relevant Lab Results:   Additional Information [id number]  Normajean Baxter, LCSW

## 2017-04-02 NOTE — Progress Notes (Signed)
Physical Therapy Treatment Patient Details Name: Heather [name] MRN: [id number] DOB: [birth year] Today's Date: 04/02/2017    History of Present Illness 66 y.o. female who sustained a mechanical fall in her kitchen. Pt with distal femur fx and underwent ORIF at her distal femur on 03/31/17. PMH of poorly-controlled DM, HTN, HLD, and stage III CKD.  Pt reports multiple back surgeries.    PT Comments    Pt more awake this date but demo's some confusion. Mobility greatly limited by increased pain today. Pt required total Ax2 to transfer to EOB and was unable to achieve standing despite max encouragement and maxAx3. Pain a limiting factor today. Acute PT to con't to follow.    Follow Up Recommendations  SNF;Supervision/Assistance - 24 hour     Equipment Recommendations  Rolling walker with 5" wheels    Recommendations for Other Services       Precautions / Restrictions Precautions Precautions: Fall Precaution Comments: pt mildly confused today Restrictions Weight Bearing Restrictions: Yes RLE Weight Bearing: Non weight bearing    Mobility  Bed Mobility Overal bed mobility: Needs Assistance Bed Mobility: Sit to Supine;Supine to Sit     Supine to sit: Max assist;+2 for physical assistance Sit to supine: Max assist;+2 for physical assistance   General bed mobility comments: pt unable to move R LE without assist, pt very sensitive to touch. limited use of R UE due to elbow pain  Transfers Overall transfer level: Needs assistance Equipment used:  (2 person lift with gait belt) Transfers: Sit to/from Stand Sit to Stand: Total assist;+2 physical assistance;From elevated surface         General transfer comment: attemptedx2, unable to achieve full standing as pt  with increased pain and pushing posteriorly. Had 3rd person hold R LE however did not assist pt. Pt unable to tolerate using UEs either due to onset of pain in bilat elbows  Ambulation/Gait             General  Gait Details: unable   Stairs            Wheelchair Mobility    Modified Rankin (Stroke Patients Only)       Balance Overall balance assessment: Needs assistance;History of Falls Sitting-balance support: Bilateral upper extremity supported;Feet supported Sitting balance-Leahy Scale: Poor Sitting balance - Comments: lateral lean   Standing balance support: Bilateral upper extremity supported;During functional activity Standing balance-Leahy Scale: Zero Standing balance comment: Pt requires Max physical A and reliant on UE for support                            Cognition Arousal/Alertness: Awake/alert Behavior During Therapy: Anxious Overall Cognitive Status: Impaired/Different from baseline Area of Impairment: Attention;Following commands;Safety/judgement;Awareness;Problem solving;Memory                   Current Attention Level: Focused Memory: Decreased recall of precautions Following Commands: Follows one step commands with increased time;Follows one step commands inconsistently Safety/Judgement: Decreased awareness of safety;Decreased awareness of deficits Awareness: Emergent Problem Solving: Slow processing;Decreased initiation;Difficulty sequencing;Requires verbal cues;Requires tactile cues General Comments: pt impulsively trying to return to sitting or supine due to pain      Exercises Other Exercises Other Exercises: tried to work on R knee flexion, unable to tolerate >20 deg of knee flexion    General Comments General comments (skin integrity, edema, etc.): ace wrap in place      Pertinent Vitals/Pain Pain Assessment: Faces Faces Pain  Scale: Hurts worst Pain Location: R LE with movement Pain Descriptors / Indicators: Constant;Grimacing;Guarding;Moaning Pain Intervention(s): Limited activity within patient's tolerance    Home Living                      Prior Function            PT Goals (current goals can now be  found in the care plan section) Acute Rehab PT Goals Patient Stated Goal: go home Progress towards PT goals: Not progressing toward goals - comment (unable to complete OOB due to pain)    Frequency    Min 3X/week      PT Plan Current plan remains appropriate    Co-evaluation              AM-PAC PT "6 Clicks" Daily Activity  Outcome Measure  Difficulty turning over in bed (including adjusting bedclothes, sheets and blankets)?: Total Difficulty moving from lying on back to sitting on the side of the bed? : Total Difficulty sitting down on and standing up from a chair with arms (e.g., wheelchair, bedside commode, etc,.)?: Total Help needed moving to and from a bed to chair (including a wheelchair)?: Total Help needed walking in hospital room?: Total Help needed climbing 3-5 steps with a railing? : Total 6 Click Score: 6    End of Session Equipment Utilized During Treatment: Gait belt;Oxygen Activity Tolerance: Patient tolerated treatment well Patient left: in bed;with call bell/phone within reach;with bed alarm set Nurse Communication: Mobility status PT Visit Diagnosis: Difficulty in walking, not elsewhere classified (R26.2);Pain Pain - Right/Left: Right Pain - part of body: Knee;Leg     Time: 1517-6160 PT Time Calculation (min) (ACUTE ONLY): 32 min  Charges:  $Therapeutic Activity: 23-37 mins                    G Codes:      Kittie Plater, PT, DPT Pager #: [phone] Office #: [phone]    [place] 04/02/2017, 1:24 PM

## 2017-04-02 NOTE — Clinical Social Work Note (Signed)
Clinical Social Work Assessment  Patient Details  Name: Heather [name] MRN: [id number] Date of Birth: November 18, 1950  Date of referral:  04/02/17               Reason for consult:  Facility Placement                Permission sought to share information with:  Chartered certified accountant granted to share information::  Yes, Verbal Permission Granted  Name::     Kamilla Hands  Agency::  SNF  Relationship::  spouse  Contact Information:     Housing/Transportation Living arrangements for the past 2 months:  [place] of Information:  Patient Patient Interpreter Needed:  None Criminal Activity/Legal Involvement Pertinent to Current Situation/Hospitalization:  No - Comment as needed Significant Relationships:  Spouse Lives with:  Spouse Do you feel safe going back to the place where you live?  No Need for family participation in patient care:  Yes (Comment)  Care giving concerns:  Patient resided at home with spouse prior to hospitalization. Patient not safe to return home at this time.  Social Worker assessment / plan:  CSW met with patient to discuss clinical teams DC recommendations to SNF. Patient indicated that she really wants to go home and understands the recommendation for further rehabilitation. CSW explained role of CSW and SNF options/placement. CSW obtained permission to send out offers to SNF's near [place] area. CSW obtained permission to contact spouse to discuss.    FL2 and passr to be obtained. Offers to send out.  Employment status:  Retired Nurse, adult PT Recommendations:  [place] / Referral to community resources:  [place]  Patient/Family's Response to care:  Patient appreciative of assistance and spouse agreeable with plan as well. Spouse indicated that [facility] could be an option for placement. CSW will f/u. No issues or concerns at this  time.  Patient/Family's Understanding of and Emotional Response to Diagnosis, Current Treatment, and Prognosis:  Patient/spouse has good understanding of diagnosis, current treatment, and prognosis. Pt hopeful that rehabilitation will assist with impairment despite wanting to return home. She understands her physical limitations. No issues or concerns at this time.  Emotional Assessment Appearance:  Appears stated age Attitude/Demeanor/Rapport:   (Cooperative) Affect (typically observed):  Accepting, Appropriate Orientation:  Oriented to Self, Oriented to Place, Oriented to  Time, Oriented to Situation Alcohol / Substance use:  Not Applicable Psych involvement (Current and /or in the community):  No (Comment)  Discharge Needs  Concerns to be addressed:  Care Coordination Readmission within the last 30 days:  No Current discharge risk:  Physical Impairment, Dependent with Mobility Barriers to Discharge:  No Barriers Identified   SELINDA KORZENIEWSKI, LCSW 04/02/2017, 12:00 PM

## 2017-04-02 NOTE — Progress Notes (Signed)
Troponin result 0.04.  [facility] made aware as well as Dr. Aurea Graff on call PA.

## 2017-04-02 NOTE — Progress Notes (Addendum)
PROGRESS NOTE    Heather [name]  TDD:[id number] DOB: September 18, 1951 DOA: 03/30/2017 PCP: [name], MD    Brief Narrative:  Patient is a 66 year old female history of type 2 diabetes, hypertension, hyperlipidemia, chronic kidney disease stage III presenting after a fall with comminuted distal right femur fracture. Patient also noted to be in acute on chronic kidney disease stage III. Patients in consultation by orthopedics and patient underwent ORIF 03/31/2017.   Assessment & Plan:   Principal Problem:   Femur fracture, right (HCC) Active Problems:   Irritable bowel syndrome   Vitamin B12 deficiency   Chronic kidney disease, stage III (moderate)   Essential hypertension   Diabetes mellitus with chronic kidney disease (HCC)   GERD (gastroesophageal reflux disease)   Mixed hyperlipidemia   Depression   Acute renal failure superimposed on stage 3 chronic kidney disease (HCC)   Hip fracture (HCC)   Closed fracture of distal end of femur, unspecified fracture morphology, initial encounter ([place])  #1 closed right distal periprosthetic femur fracture Secondary to mechanical fall. Status post ORIF per Dr. Alvan Dame 03/31/2017. PT/OT. Likely needs skilled nursing facility.  #2 acute on chronic kidney disease stage III Likely secondary to prerenal renal azotemia in the setting of diuretics. Renal function fluctuating creatinine currently at 2.23. Patient has been transfused 2 units packed red blood cells. Will resume home regimen oral Lasix. Urinalysis nitrite negative, leukocyte negative. Renal ultrasound negative for hydronephrosis. NSL IVF.  #3 diabetes mellitus Hemoglobin A1c 8.9 03/07/2017. CBGs ranging from 147 -176. Continue 1/2 home dose levemir and titrate once patient is tolerating diet. Sliding scale insulin. Reglan.  #4 gastroesophageal reflux disease Continue PPI.  #5 hypertension Continue Lopressor.  #6 depression Stable. Continue Celexa.  #7 hyperlipidemia Continue  statin.  #8 IBS Stable. Outpatient follow-up.  #9 chest pain Patient with some complaints of chest pain that she describes as a pressure. Chest pain somewhat reproducible. Will cycle cardiac enzymes every 6 hours 3. Check a 2-D echo. Check a EKG. Place on scheduled Tylenol. Follow.  #10 postop acute blood loss anemia Hemoglobin this morning 9.2 from 7.6 from 8.0 from 10.4 03/31/2017. Patient with no overt bleeding. Anemia panel consistent with anemia of chronic disease. Patient status post 2 units packed red blood cells 04/01/2017. Follow H&H.       DVT prophylaxis: Lovenox Code Status: Full Family Communication: Updated patient. No family at bedside. Disposition Plan: Likely skilled nursing facility hopefully 04/03/2017   Consultants:   Orthopedics: Dr. Alvan Dame 03/31/2017  Procedures:   Chest x-ray 03/31/2017  Plain films right elbow 03/30/2017  Plain films right knee 03/30/2017  Renal ultrasound 03/31/2017  ORIF right distal periprosthetic fracture 03/31/2017 per Dr. Marshia Ly PICC line placed on the fluoroscopy per IR 04/01/2017  2 units packed red blood cells 04/01/2017  Antimicrobials:  None   Subjective: Patient laying in bed. No shortness of breath. Patient complaining of chest pain that she states feels like a pressure which started postoperatively however she stated she did not want to tell anybody.   Objective: Vitals:   04/02/17 0120 04/02/17 0135 04/02/17 0425 04/02/17 1059  BP: (!) 164/65 (!) 131/57 140/63 (!) 155/56  Pulse: 99 85 84 (!) 104  Resp: 18 18 18    Temp: 98.6 F (37 C) 99.1 F (37.3 C) 98.1 F (36.7 C)   TempSrc: Oral Oral Oral   SpO2: 98% 97% 99%   Weight:      Height:        Intake/Output Summary (  Last 24 hours) at 04/02/17 1238 Last data filed at 04/02/17 0900  Gross per 24 hour  Intake             1251 ml  Output             1600 ml  Net             -349 ml   Filed Weights   03/30/17 1224  Weight: 74.4 kg (164 lb)     Examination:  General exam: NAD Respiratory system: Some bibasilar crackles. Respiratory effort normal. Cardiovascular system: S1 & S2 heard, RRR. No JVD, murmurs, rubs, gallops or clicks. No pedal edema. Gastrointestinal system: Abdomen is nondistended, soft and nontender. No organomegaly or masses felt. Normal bowel sounds heard. Central nervous system: Alert and oriented. No focal neurological deficits. Extremities: Symmetric 5 x 5 power. Skin: No rashes, lesions or ulcers Psychiatry: Judgement and insight appear fair. Mood & affect appropriate.     Data Reviewed: I have personally reviewed following labs and imaging studies  CBC:  Recent Labs Lab 03/30/17 1313 03/31/17 0500 04/01/17 0428 04/01/17 1438 04/02/17 0500  WBC 12.9* 19.5* 10.8*  --  9.3  NEUTROABS 10.2*  --   --   --   --   HGB 10.0* 10.4* 8.0* 7.6* 9.2*  HCT 31.0* 33.6* 25.6* 23.9* 28.3*  MCV 86.6 90.8 89.8  --  86.3  PLT 253 325 214  --  497*   Basic Metabolic Panel:  Recent Labs Lab 03/30/17 1313 03/31/17 0500 04/01/17 0428 04/02/17 0500  NA 131* 136 136 135  K 3.8 4.1 3.8 3.7  CL 98* 105 105 100*  CO2 20* 19* 22 24  GLUCOSE 399* 173* 154* 171*  BUN 37* 28* 25* 27*  CREATININE 2.49* 2.04* 1.87* 2.23*  CALCIUM 9.3 9.1 8.9 8.8*   GFR: Estimated Creatinine Clearance: 24.1 mL/min (A) (by C-G formula based on SCr of 2.23 mg/dL (H)). Liver Function Tests:  Recent Labs Lab 03/31/17 0500  ALBUMIN 3.4*   No results for input(s): LIPASE, AMYLASE in the last 168 hours. No results for input(s): AMMONIA in the last 168 hours. Coagulation Profile: No results for input(s): INR, PROTIME in the last 168 hours. Cardiac Enzymes:  Recent Labs Lab 03/30/17 1313 03/31/17 0500  CKTOTAL 176 185   BNP (last 3 results) No results for input(s): PROBNP in the last 8760 hours. HbA1C: No results for input(s): HGBA1C in the last 72 hours. CBG:  Recent Labs Lab 04/01/17 1952 04/02/17 0049  04/02/17 0439 04/02/17 0833 04/02/17 1213  GLUCAP 198* 165* 152* 147* 176*   Lipid Profile: No results for input(s): CHOL, HDL, LDLCALC, TRIG, CHOLHDL, LDLDIRECT in the last 72 hours. Thyroid Function Tests: No results for input(s): TSH, T4TOTAL, FREET4, T3FREE, THYROIDAB in the last 72 hours. Anemia Panel:  Recent Labs  04/01/17 0428  VITAMINB12 1,493*  FOLATE 10.3  FERRITIN 1,829*  TIBC 318  IRON 34   Sepsis Labs: No results for input(s): PROCALCITON, LATICACIDVEN in the last 168 hours.  Recent Results (from the past 240 hour(s))  Surgical pcr screen     Status: None   Collection Time: 03/30/17 10:20 PM  Result Value Ref Range Status   MRSA, PCR NEGATIVE NEGATIVE Final   Staphylococcus aureus NEGATIVE NEGATIVE Final    Comment:        The Xpert SA Assay (FDA approved for NASAL specimens in patients over 81 years of age), is one component of a comprehensive surveillance program.  Test performance has been validated by [facility] for patients greater than or equal to 22 year old. It is not intended to diagnose infection nor to guide or monitor treatment.   Urine culture     Status: None   Collection Time: 03/31/17  3:55 PM  Result Value Ref Range Status   Specimen Description URINE, RANDOM  Final   Special Requests NONE  Final   Culture NO GROWTH  Final   Report Status 04/01/2017 FINAL  Final         Radiology Studies: Ir Fluoro Guide Cv Line Right  Result Date: 04/01/2017 INDICATION: Cellulitis EXAM: RIGHT IJ VEIN PICC LINE PLACEMENT WITH ULTRASOUND AND FLUOROSCOPIC GUIDANCE MEDICATIONS: None ANESTHESIA/SEDATION: None FLUOROSCOPY TIME:  Fluoroscopy Time:  minutes 18 seconds (1 mGy). COMPLICATIONS: None immediate. PROCEDURE: The patient was advised of the possible risks and complications and agreed to undergo the procedure. The patient was then brought to the angiographic suite for the procedure. The right neck was prepped with chlorhexidine, draped in the  usual sterile fashion using maximum barrier technique (cap and mask, sterile gown, sterile gloves, large sterile sheet, hand hygiene and cutaneous antiseptic). Local anesthesia was attained by infiltration with 1% lidocaine. Ultrasound demonstrated patency of the jugular vein, and this was documented with an image. Under real-time ultrasound guidance, this vein was accessed with a 21 gauge micropuncture needle and image documentation was performed. The needle was exchanged over a guidewire for a peel-away sheath through which a 15 cm 5 [place] double lumen power injectable PICC was advanced, and positioned with its tip at the lower SVC/right atrial junction. Fluoroscopy during the procedure and fluoro spot radiograph confirms appropriate catheter position. The catheter was flushed, secured to the skin with Prolene sutures, and covered with a sterile dressing. IMPRESSION: Successful placement of a right IJ PICC with sonographic and fluoroscopic guidance. The catheter is ready for use. Electronically Signed   By: Marybelle Killings M.D.   On: 04/01/2017 09:21   Ir US Guide Vasc Access Right  Result Date: 04/01/2017 INDICATION: Cellulitis EXAM: RIGHT IJ VEIN PICC LINE PLACEMENT WITH ULTRASOUND AND FLUOROSCOPIC GUIDANCE MEDICATIONS: None ANESTHESIA/SEDATION: None FLUOROSCOPY TIME:  Fluoroscopy Time:  minutes 18 seconds (1 mGy). COMPLICATIONS: None immediate. PROCEDURE: The patient was advised of the possible risks and complications and agreed to undergo the procedure. The patient was then brought to the angiographic suite for the procedure. The right neck was prepped with chlorhexidine, draped in the usual sterile fashion using maximum barrier technique (cap and mask, sterile gown, sterile gloves, large sterile sheet, hand hygiene and cutaneous antiseptic). Local anesthesia was attained by infiltration with 1% lidocaine. Ultrasound demonstrated patency of the jugular vein, and this was documented with an image. Under  real-time ultrasound guidance, this vein was accessed with a 21 gauge micropuncture needle and image documentation was performed. The needle was exchanged over a guidewire for a peel-away sheath through which a 15 cm 5 [place] double lumen power injectable PICC was advanced, and positioned with its tip at the lower SVC/right atrial junction. Fluoroscopy during the procedure and fluoro spot radiograph confirms appropriate catheter position. The catheter was flushed, secured to the skin with Prolene sutures, and covered with a sterile dressing. IMPRESSION: Successful placement of a right IJ PICC with sonographic and fluoroscopic guidance. The catheter is ready for use. Electronically Signed   By: Marybelle Killings M.D.   On: 04/01/2017 09:21        Scheduled Meds: . allopurinol  100 mg Oral  Daily  . atorvastatin  80 mg Oral q1800  . cholecalciferol  1,000 Units Oral Daily  . citalopram  20 mg Oral Daily  . enoxaparin (LOVENOX) injection  30 mg Subcutaneous Q24H  . feeding supplement (GLUCERNA SHAKE)  237 mL Oral BID BM  . ferrous sulfate  325 mg Oral BID WC  . furosemide  40 mg Oral Daily  . insulin aspart  0-20 Units Subcutaneous Q4H  . insulin detemir  40 Units Subcutaneous QHS  . metoCLOPramide  10 mg Oral TID AC  . metoprolol tartrate  100 mg Oral BID  . [START ON 04/03/2017] pantoprazole  40 mg Oral Q0600  . senna  1 tablet Oral BID  . simethicone  160 mg Oral QID  . sodium chloride flush  10-40 mL Intracatheter Q12H   Continuous Infusions: . sodium chloride    . methocarbamol (ROBAXIN)  IV       LOS: 3 days    Time spent: 8 minutes    THOMPSON,DANIEL, MD [facility] Pager [phone]  If 7PM-7AM, please contact night-coverage [email or web address] Password [facility] 04/02/2017, 12:38 PM

## 2017-04-02 NOTE — Progress Notes (Signed)
Patient ID: Heather [name], female   DOB: [birth year], 66 y.o.   MRN: [id number] Subjective: 2 Days Post-Op Procedure(s) (LRB): OPEN REDUCTION INTERNAL FIXATION (ORIF) DISTAL FEMUR FRACTURE (Right)    Patient reports pain as mild. Again, sleeping in room.  No events reported  Objective:   VITALS:   Vitals:   04/02/17 1059 04/02/17 1312  BP: (!) 155/56 (!) 149/64  Pulse: (!) 104 86  Resp:  15  Temp:  98.9 F (37.2 C)    Neurovascular intact Incision: dressing C/D/I - aquacell in place  LABS  Recent Labs  03/31/17 0500 04/01/17 0428 04/01/17 1438 04/02/17 0500  HGB 10.4* 8.0* 7.6* 9.2*  HCT 33.6* 25.6* 23.9* 28.3*  WBC 19.5* 10.8*  --  9.3  PLT 325 214  --  142*     Recent Labs  03/31/17 0500 04/01/17 0428 04/02/17 0500  NA 136 136 135  K 4.1 3.8 3.7  BUN 28* 25* 27*  CREATININE 2.04* 1.87* 2.23*  GLUCOSE 173* 154* 171*    No results for input(s): LABPT, INR in the last 72 hours.   Assessment/Plan: 2 Days Post-Op Procedure(s) (LRB): OPEN REDUCTION INTERNAL FIXATION (ORIF) DISTAL FEMUR FRACTURE (Right)   Up with therapy  D/C pending NWB RLE Knee motion right knee encouraged and recommended to prevent stiffness RTC in 2 weeks Pain meds and Lovenox at D/C for 2 weeks

## 2017-04-03 DIAGNOSIS — N183 Chronic kidney disease, stage 3 (moderate): Secondary | ICD-10-CM

## 2017-04-03 DIAGNOSIS — S7291XD Unspecified fracture of right femur, subsequent encounter for closed fracture with routine healing: Secondary | ICD-10-CM

## 2017-04-03 DIAGNOSIS — Z794 Long term (current) use of insulin: Secondary | ICD-10-CM

## 2017-04-03 DIAGNOSIS — N179 Acute kidney failure, unspecified: Secondary | ICD-10-CM

## 2017-04-03 DIAGNOSIS — E1122 Type 2 diabetes mellitus with diabetic chronic kidney disease: Secondary | ICD-10-CM

## 2017-04-03 LAB — BPAM RBC
Blood Product Expiration Date: 201806212359
Blood Product Expiration Date: 201806232359
ISSUE DATE / TIME: 201806042155
ISSUE DATE / TIME: 201806050119
Unit Type and Rh: 5100
Unit Type and Rh: 5100

## 2017-04-03 LAB — CBC WITH DIFFERENTIAL/PLATELET
BASOS PCT: 0 %
Basophils Absolute: 0 10*3/uL (ref 0.0–0.1)
EOS ABS: 0.1 10*3/uL (ref 0.0–0.7)
EOS PCT: 2 %
HCT: 26.3 % — ABNORMAL LOW (ref 36.0–46.0)
Hemoglobin: 8.5 g/dL — ABNORMAL LOW (ref 12.0–15.0)
LYMPHS ABS: 1 10*3/uL (ref 0.7–4.0)
Lymphocytes Relative: 13 %
MCH: 27.9 pg (ref 26.0–34.0)
MCHC: 32.3 g/dL (ref 30.0–36.0)
MCV: 86.2 fL (ref 78.0–100.0)
MONOS PCT: 9 %
Monocytes Absolute: 0.7 10*3/uL (ref 0.1–1.0)
Neutro Abs: 5.9 10*3/uL (ref 1.7–7.7)
Neutrophils Relative %: 76 %
Platelets: 154 10*3/uL (ref 150–400)
RBC: 3.05 MIL/uL — ABNORMAL LOW (ref 3.87–5.11)
RDW: 16.7 % — ABNORMAL HIGH (ref 11.5–15.5)
WBC: 7.8 10*3/uL (ref 4.0–10.5)

## 2017-04-03 LAB — BASIC METABOLIC PANEL
Anion gap: 8 (ref 5–15)
BUN: 32 mg/dL — AB (ref 6–20)
CALCIUM: 8.8 mg/dL — AB (ref 8.9–10.3)
CHLORIDE: 100 mmol/L — AB (ref 101–111)
CO2: 26 mmol/L (ref 22–32)
CREATININE: 2.28 mg/dL — AB (ref 0.44–1.00)
GFR calc non Af Amer: 22 mL/min — ABNORMAL LOW (ref >60)
GFR, EST AFRICAN AMERICAN: 25 mL/min — AB (ref >60)
Glucose, Bld: 224 mg/dL — ABNORMAL HIGH (ref 65–99)
Potassium: 3.6 mmol/L (ref 3.5–5.1)
SODIUM: 134 mmol/L — AB (ref 135–145)

## 2017-04-03 LAB — GLUCOSE, CAPILLARY
GLUCOSE-CAPILLARY: 114 mg/dL — AB (ref 65–99)
GLUCOSE-CAPILLARY: 137 mg/dL — AB (ref 65–99)
GLUCOSE-CAPILLARY: 211 mg/dL — AB (ref 65–99)
Glucose-Capillary: 197 mg/dL — ABNORMAL HIGH (ref 65–99)

## 2017-04-03 LAB — TYPE AND SCREEN
ABO/RH(D): O POS
Antibody Screen: NEGATIVE
Unit division: 0
Unit division: 0

## 2017-04-03 LAB — TROPONIN I: Troponin I: 0.04 ng/mL (ref <0.03)

## 2017-04-03 MED ORDER — DM-GUAIFENESIN ER 30-600 MG PO TB12
1.0000 | ORAL_TABLET | Freq: Two times a day (BID) | ORAL | Status: DC
  Filled 2017-04-03 (×3): qty 1

## 2017-04-03 MED ORDER — CITALOPRAM HYDROBROMIDE 40 MG PO TABS: 20.0000 mg | ORAL_TABLET | Freq: Every day | ORAL | Status: DC

## 2017-04-03 MED ORDER — TIZANIDINE HCL 4 MG PO TABS: 4.0000 mg | ORAL_TABLET | Freq: Every day | ORAL | 0 refills | Status: DC

## 2017-04-03 MED ORDER — DIAZEPAM 5 MG PO TABS: 5.0000 mg | ORAL_TABLET | Freq: Two times a day (BID) | ORAL | 0 refills | Status: DC | PRN

## 2017-04-03 MED ORDER — FERROUS SULFATE 325 (65 FE) MG PO TABS: 325.0000 mg | ORAL_TABLET | Freq: Two times a day (BID) | ORAL | 3 refills | Status: DC

## 2017-04-03 MED ORDER — INSULIN DETEMIR 100 UNIT/ML FLEXPEN: 80.0000 [IU] | PEN_INJECTOR | Freq: Every day | SUBCUTANEOUS | Status: DC

## 2017-04-03 NOTE — Social Work (Signed)
Clinical Social Worker facilitated patient discharge including contacting patient family and facility to confirm patient discharge plans.  Clinical information faxed to facility and family agreeable with plan.  CSW arranged ambulance transport via [facility] to [facility].  RN to call [phone] report prior to discharge.  Clinical Social Worker will sign off for now as social work intervention is no longer needed. Please consult [place] again if new need arises.  Elissa Hefty, LCSW Clinical Social Worker [phone]

## 2017-04-03 NOTE — Care Management Important Message (Signed)
Important Message  Patient Details  Name: Heather [name] MRN: [id number] Date of Birth: December 29, 1950   Medicare Important Message Given:  Yes    Orbie Pyo 04/03/2017, 11:39 AM

## 2017-04-03 NOTE — Progress Notes (Signed)
Physical Therapy Treatment Patient Details Name: Heather [name] MRN: [id number] DOB: [birth year] Today's Date: 04/03/2017    History of Present Illness 66 y.o. female who sustained a mechanical fall in her kitchen. Pt with distal femur fx and underwent ORIF at her distal femur on 03/31/17. PMH of poorly-controlled DM, HTN, HLD, and stage III CKD.  Pt reports multiple back surgeries.    PT Comments    Pt with improved pain control this date and was able to tolerate OOB to chair with maxAx2. Pt remains unable to complete quad set or manage R LE without assist. Con't to recommend SNF upon d/c. Acute PT to con't to follow.   Follow Up Recommendations  SNF;Supervision/Assistance - 24 hour     Equipment Recommendations  Rolling walker with 5" wheels    Recommendations for Other Services       Precautions / Restrictions Precautions Precautions: Fall Restrictions Weight Bearing Restrictions: Yes RLE Weight Bearing: Non weight bearing    Mobility  Bed Mobility Overal bed mobility: Needs Assistance Bed Mobility: Supine to Sit     Supine to sit: Mod assist;+2 for physical assistance     General bed mobility comments: pt with less pain in R LE with transfer to EOB, remains to require assist for R LE management and trunk elevation due to R LE pain and bilat elbow pain  Transfers Overall transfer level: Needs assistance Equipment used:  (2 person lift with gait belt) Transfers: Sit to/from Stand Sit to Stand: Max assist;+2 physical assistance Stand pivot transfers: Max assist;+2 physical assistance       General transfer comment: 1 person held R LE due to pain and pt inability to maintain R LE NWB and 1 person to pvt pt on L LE, L knee blocked, pt's L elbow extremely sensitive to touch and painful limiting ability to use functionally to assist with transfer  Ambulation/Gait             General Gait Details: unable at this time   Stairs            Wheelchair  Mobility    Modified Rankin (Stroke Patients Only)       Balance Overall balance assessment: Needs assistance;History of Falls Sitting-balance support: Bilateral upper extremity supported;Feet supported Sitting balance-Leahy Scale: Poor Sitting balance - Comments: L lateral lean   Standing balance support: Bilateral upper extremity supported;During functional activity Standing balance-Leahy Scale: Zero Standing balance comment: Pt requires Max physical A and reliant on UE for support                            Cognition Arousal/Alertness: Awake/alert Behavior During Therapy: Anxious Overall Cognitive Status: Impaired/Different from baseline Area of Impairment: Problem solving;Following commands                       Following Commands: Follows one step commands with increased time;Follows one step commands inconsistently     Problem Solving: Slow processing;Decreased initiation;Difficulty sequencing;Requires verbal cues;Requires tactile cues General Comments: pt very hesitant due to pain in R LE, pt easily distracted and requires v/c's to stay on task      Exercises Other Exercises Other Exercises: worked on R knee ROM pt very limited by pain. achieve -10-37degrees of ROM Other Exercises: pt unable to initiate quad set    General Comments General comments (skin integrity, edema, etc.): dressing intact      Pertinent Vitals/Pain Pain Assessment:  Faces Faces Pain Scale: Hurts even more Pain Location: R LE with movement Pain Descriptors / Indicators: Constant;Grimacing;Guarding;Moaning Pain Intervention(s): Monitored during session    Home Living                      Prior Function            PT Goals (current goals can now be found in the care plan section) Acute Rehab PT Goals Patient Stated Goal: go home Progress towards PT goals: Progressing toward goals    Frequency    Min 3X/week      PT Plan Current plan remains  appropriate    Co-evaluation              AM-PAC PT "6 Clicks" Daily Activity  Outcome Measure  Difficulty turning over in bed (including adjusting bedclothes, sheets and blankets)?: A Lot Difficulty moving from lying on back to sitting on the side of the bed? : A Lot Difficulty sitting down on and standing up from a chair with arms (e.g., wheelchair, bedside commode, etc,.)?: A Lot Help needed moving to and from a bed to chair (including a wheelchair)?: Total Help needed walking in hospital room?: Total Help needed climbing 3-5 steps with a railing? : Total 6 Click Score: 9    End of Session Equipment Utilized During Treatment: Gait belt Activity Tolerance: Patient tolerated treatment well Patient left: in chair;with call bell/phone within reach Nurse Communication: Mobility status PT Visit Diagnosis: Difficulty in walking, not elsewhere classified (R26.2);Pain Pain - Right/Left: Right Pain - part of body: Knee;Leg     Time: 1015-1040 PT Time Calculation (min) (ACUTE ONLY): 25 min  Charges:  $Therapeutic Exercise: 8-22 mins $Therapeutic Activity: 8-22 mins                    G Codes:       Kittie Plater, PT, DPT Pager #: [phone] Office #: [phone]    [place] 04/03/2017, 11:09 AM

## 2017-04-03 NOTE — Clinical Social Work Placement (Signed)
   CLINICAL SOCIAL WORK PLACEMENT  NOTE  Date:  04/03/2017  Patient Details  Name: Heather [name] MRN: [id number] Date of Birth: 03/20/1951  Clinical Social Work is seeking post-discharge placement for this patient at the [place] level of care (*CSW will initial, date and re-position this form in  chart as items are completed):  Yes   Patient/family provided with [facility] Work Department's list of facilities offering this level of care within the geographic area requested by the patient (or if unable, by the patient's family).  Yes   Patient/family informed of their freedom to choose among providers that offer the needed level of care, that participate in Medicare, Medicaid or managed care program needed by the patient, have an available bed and are willing to accept the patient.  Yes   Patient/family informed of [facility]'s ownership interest in [facility] and [facility], as well as of the fact that they are under no obligation to receive care at these facilities.  PASRR submitted to EDS on       PASRR number received on 04/02/17     Existing PASRR number confirmed on 04/02/17     FL2 transmitted to all facilities in geographic area requested by pt/family on 04/02/17     FL2 transmitted to all facilities within larger geographic area on 04/02/17     Patient informed that his/her managed care company has contracts with or will negotiate with certain facilities, including the following:        Yes   Patient/family informed of bed offers received.  Patient chooses bed at [facility]     Physician recommends and patient chooses bed at      Patient to be transferred to [facility] on 04/03/17.  Patient to be transferred to facility by [facility]     Patient family notified on 04/03/17 of transfer.  Name of family member notified:  patient is responsible for self  & husband notified   PHYSICIAN Please  prepare priority discharge summary, including medications, Please prepare prescriptions, Please sign FL2     Additional Comment:    _______________________________________________ Normajean Baxter, LCSW 04/03/2017, 2:15 PM

## 2017-04-03 NOTE — Discharge Summary (Signed)
Physician Discharge Summary  Heather [name] WUJ:[id number] DOB: [birth year] DOA: 03/30/2017  PCP: [name], MD  Admit date: 03/30/2017 Discharge date: 04/03/2017  Admitted From:home Disposition:SNF  Recommendations for Outpatient Follow-up:  1. Follow up with PCP and orthopedics in 1-2 weeks 2. Please obtain BMP/CBC in one week  Home Health:SNF Equipment/Devices:SNF Discharge Condition:stable CODE STATUS:full code Diet recommendation:carb modified heart healthy  Brief/Interim Summary: 66 year old female history of type 2 diabetes, hypertension, hyperlipidemia, chronic kidney disease stage III presenting after a fall with comminuted distal right femur fracture. Patient also noted to be in acute on chronic kidney disease stage III. Patients in consultation by orthopedics and patient underwent ORIF 03/31/2017.  # closed right distal periprosthetic femur fracture Secondary to mechanical fall. Status post ORIF per Dr. Alvan Dame 03/31/2017. Evaluated by physical therapist. Patient is being discharged to skilled nursing facility. Discharging with the pain medications and the Lovenox by orthopedics. -Patient is clinically stable.  # acute on chronic kidney disease stage III: -Serum creatinine level stable. Elevated creatinine level likely in the setting of surgery and prerenal. Recommended outpatient monitoring of her life and follow up with PCP. Ultrasound of kidneys with no hydronephrosis.  # diabetes mellitus Hemoglobin A1c 8.9 03/07/2017. Resume home dose of insulin. Recommended to monitor blood sugar level and follow-up with PCP.  # gastroesophageal reflux disease Continue PPI.  # hypertension Continue Lopressor.  # depression Stable. Continue Celexa.  # hyperlipidemia Continue statin.  # IBS Stable. Outpatient follow-up.  # postop acute blood loss anemia Received red blood cell transfusion. No sign of bleeding. Recommended to monitor CBC and BMP  outpatient.  Patient is clinically stable. Denied nausea vomiting pain. As per social worker patient has rehabilitation facility available. Orthopedics discharging with pain medications and Lovenox subcutaneous. Outpatient follow-up with orthopedics and PCP. At this time patient is clinically stable to discharge with outpatient follow-up.  Discharge Diagnoses:  Principal Problem:   Femur fracture, right ([place]) Active Problems:   Irritable bowel syndrome   Vitamin B12 deficiency   Chronic kidney disease, stage III (moderate)   Essential hypertension   Diabetes mellitus with chronic kidney disease (HCC)   GERD (gastroesophageal reflux disease)   Mixed hyperlipidemia   Depression   Acute renal failure superimposed on stage 3 chronic kidney disease (HCC)   Hip fracture (HCC)   Closed fracture of distal end of femur, unspecified fracture morphology, initial encounter [facility])   Surgery, elective    Discharge Instructions  Discharge Instructions    Call MD for:  difficulty breathing, headache or visual disturbances    Complete by:  As directed    Call MD for:  extreme fatigue    Complete by:  As directed    Call MD for:  hives    Complete by:  As directed    Call MD for:  persistant dizziness or light-headedness    Complete by:  As directed    Call MD for:  persistant nausea and vomiting    Complete by:  As directed    Call MD for:  severe uncontrolled pain    Complete by:  As directed    Call MD for:  temperature >100.4    Complete by:  As directed    Diet - low sodium heart healthy    Complete by:  As directed    Diet Carb Modified    Complete by:  As directed    Increase activity slowly    Complete by:  As directed    Non  weight bearing    Complete by:  As directed    Range of motion of the right knee is encouraged.   Laterality:  right   Extremity:  Lower     Allergies as of 04/03/2017      Reactions   Duloxetine Hcl Swelling   Fluocinolone Swelling   Acetaminophen  Itching, Swelling   Pt states "some swelling"   Amlodipine Besylate Swelling   Swelling of feet, fluid retention   Ciprofloxacin Nausea And Vomiting, Swelling   Swelling of face, jaw, and lips   Hydrocodone-acetaminophen Itching   Metformin Other (See Comments)   REACTION: GI UPSET   Nsaids Other (See Comments)   "HAS BLEEDING ULCERS"   Quinolones Swelling   Sulfamethoxazole Swelling   Swelling of feet, legs   Sulfonamide Derivatives Swelling   Valsartan Swelling   Cozaar [losartan] Swelling, Rash   Lisinopril Swelling, Rash   Oral swelling, and red streaks on arms/stomach      Medication List    TAKE these medications   allopurinol 100 MG tablet Commonly known as:  ZYLOPRIM Take 100 mg by mouth daily.   alosetron 0.5 MG tablet Commonly known as:  LOTRONEX Take 0.5 mg by mouth 2 (two) times daily. What changed:  Another medication with the same name was removed. Continue taking this medication, and follow the directions you see here.   aspirin EC 81 MG tablet Take 81 mg by mouth daily.   atorvastatin 80 MG tablet Commonly known as:  LIPITOR Take 80 mg by mouth daily.   blood glucose meter kit and supplies Kit Dispense based on patient and insurance preference. Use up to four times daily as directed. (FOR ICD-10 E11.65)   cholecalciferol 1000 units tablet Commonly known as:  VITAMIN D Take 1,000 Units by mouth daily.   citalopram 40 MG tablet Commonly known as:  CELEXA Take 0.5 tablets (20 mg total) by mouth daily. What changed:  how much to take   cyanocobalamin 1000 MCG/ML injection Commonly known as:  (VITAMIN B-12) Inject 1,000 mcg into the muscle every 30 (thirty) days.   diazepam 5 MG tablet Commonly known as:  VALIUM Take 1 tablet (5 mg total) by mouth 2 (two) times daily as needed for anxiety.   diphenoxylate-atropine 2.5-0.025 MG tablet Commonly known as:  LOMOTIL Take 2 tablets by mouth 4 (four) times daily as needed for diarrhea or loose  stools.   enoxaparin 30 MG/0.3ML injection Commonly known as:  LOVENOX Inject 0.3 mLs (30 mg total) into the skin daily.   esomeprazole 40 MG capsule Commonly known as:  NEXIUM Take 40 mg by mouth daily before breakfast.   fenofibrate 145 MG tablet Commonly known as:  TRICOR Take 1 tablet (145 mg total) by mouth daily.   ferrous sulfate 325 (65 FE) MG tablet Take 1 tablet (325 mg total) by mouth 2 (two) times daily with a meal.   furosemide 40 MG tablet Commonly known as:  LASIX Take 1 tablet (40 mg total) by mouth daily.   glucose blood test strip Commonly known as:  ONE TOUCH TEST STRIPS Use as instructed 4 x daily. E11.65   HUMALOG KWIKPEN [place] Inject into the skin 3 (three) times daily with meals. Sliding scale   Insulin Detemir 100 UNIT/ML Pen Commonly known as:  LEVEMIR FLEXTOUCH Inject 80 Units into the skin at bedtime.   liraglutide 18 MG/3ML Sopn Commonly known as:  VICTOZA USE 1.8 MG EVERY DAY AS DIRECTED   methocarbamol 500 MG  tablet Commonly known as:  ROBAXIN Take 1 tablet (500 mg total) by mouth every 6 (six) hours as needed for muscle spasms.   metoCLOPramide 10 MG tablet Commonly known as:  REGLAN Take 1 tablet (10 mg total) by mouth 3 (three) times daily before meals.   metoprolol tartrate 100 MG tablet Commonly known as:  LOPRESSOR Take 100 mg by mouth 2 (two) times daily.   omega-3 acid ethyl esters 1 g capsule Commonly known as:  LOVAZA Take 1 capsule (1 g total) by mouth 2 (two) times daily.   ONETOUCH VERIO w/Device Kit 1 each by Does not apply route as needed. Test 4 times daily   oxyCODONE 5 MG immediate release tablet Commonly known as:  Oxy IR/ROXICODONE Take 1-2 tablets (5-10 mg total) by mouth every 4 (four) hours as needed for moderate pain or severe pain.   potassium chloride SA 20 MEQ tablet Commonly known as:  K-DUR,KLOR-CON Take 20 mEq by mouth daily.   tiZANidine 4 MG tablet Commonly known as:  ZANAFLEX Take 1 tablet (4  mg total) by mouth at bedtime. What changed:  how much to take            Durable Medical Equipment        Start     Ordered   04/02/17 0858  For home use only DME Walker rolling  Once    Question:  Patient needs a walker to treat with the following condition  Answer:  Fracture   04/02/17 0858   04/01/17 1714  For home use only DME Walker rolling  Once    Question:  Patient needs a walker to treat with the following condition  Answer:  Fracture   04/01/17 1714      Contact information for follow-up providers    Paralee Cancel, MD. Schedule an appointment as soon as possible for a visit in 2 week(s).   Specialty:  Orthopedic Surgery Contact information: [address] [place] [address] [phone]        [name], MD. Schedule an appointment as soon as possible for a visit in 1 week(s).   Specialty:  Internal Medicine Contact information: [place] [address] [phone]            Contact information for after-discharge care    [facility] Follow up.   Specialty:  [place] information: [address] [place] [phone]                 Allergies  Allergen Reactions  . Duloxetine Hcl Swelling  . Fluocinolone Swelling  . Acetaminophen Itching and Swelling    Pt states "some swelling"  . Amlodipine Besylate Swelling    Swelling of feet, fluid retention  . Ciprofloxacin Nausea And Vomiting and Swelling    Swelling of face, jaw, and lips  . Hydrocodone-Acetaminophen Itching  . Metformin Other (See Comments)    REACTION: GI UPSET  . Nsaids Other (See Comments)    "HAS BLEEDING ULCERS"  . Quinolones Swelling  . Sulfamethoxazole Swelling    Swelling of feet, legs  . Sulfonamide Derivatives Swelling  . Valsartan Swelling  . Cozaar [Losartan] Swelling and Rash  . Lisinopril Swelling and Rash    Oral swelling, and red streaks on  arms/stomach    Consultations:  Orthopedics: Dr. Alvan Dame 03/31/2017  Procedures/Studies:  Chest x-ray 03/31/2017  Plain films right elbow 03/30/2017  Plain films  right knee 03/30/2017  Renal ultrasound 03/31/2017  ORIF right distal periprosthetic fracture 03/31/2017 per Dr. Marshia Ly PICC line placed on the fluoroscopy per IR 04/01/2017  2 units packed red blood cells 04/01/2017  Subjective: Seen and examined at bedside. Denied nausea, vomiting, chest, shortness of breath. SHe agreed to go to rehabilitation after discharge.  Discharge Exam: Vitals:   04/02/17 1900 04/03/17 0532  BP: (!) 141/48 (!) 135/56  Pulse: 97 80  Resp: 16 17  Temp: 99.1 F (37.3 C) 98 F (36.7 C)   Vitals:   04/02/17 1059 04/02/17 1312 04/02/17 1900 04/03/17 0532  BP: (!) 155/56 (!) 149/64 (!) 141/48 (!) 135/56  Pulse: (!) 104 86 97 80  Resp:  _0 Temp:  98.9 F (37.2 C) 99.1 F (37.3 C) 98 F (36.7 C)  TempSrc:  Oral Oral Oral  SpO2:  98% 97% 100%  Weight:      Height:        General: Pt is alert, awake, not in acute distress Cardiovascular: RRR, S1/S2 +, no rubs, no gallops Respiratory: CTA bilaterally, no wheezing, no rhonchi Abdominal: Soft, NT, ND, bowel sounds + Extremities: no edema, no cyanosis. Surgery site has dressing with no sign of bleeding.    The results of significant diagnostics from this hospitalization (including imaging, microbiology, ancillary and laboratory) are listed below for reference.     Microbiology: Recent Results (from the past 240 hour(s))  Surgical pcr screen     Status: None   Collection Time: 03/30/17 10:20 PM  Result Value Ref Range Status   MRSA, PCR NEGATIVE NEGATIVE Final   Staphylococcus aureus NEGATIVE NEGATIVE Final    Comment:        The Xpert SA Assay (FDA approved for NASAL specimens in patients over 64 years of age), is one component of a comprehensive surveillance program.  Test performance has been validated by  [facility] for patients greater than or equal to 18 year old. It is not intended to diagnose infection nor to guide or monitor treatment.   Urine culture     Status: None   Collection Time: 03/31/17  3:55 PM  Result Value Ref Range Status   Specimen Description URINE, RANDOM  Final   Special Requests NONE  Final   Culture NO GROWTH  Final   Report Status 04/01/2017 FINAL  Final     Labs: BNP (last 3 results) No results for input(s): BNP in the last 8760 hours. Basic Metabolic Panel:  Recent Labs Lab 03/30/17 1313 03/31/17 0500 04/01/17 0428 04/02/17 0500 04/03/17 0022  NA 131* 136 136 135 134*  K 3.8 4.1 3.8 3.7 3.6  CL 98* 105 105 100* 100*  CO2 20* 19* _1 GLUCOSE 399* 173* 154* 171* 224*  BUN 37* 28* 25* 27* 32*  CREATININE 2.49* 2.04* 1.87* 2.23* 2.28*  CALCIUM 9.3 9.1 8.9 8.8* 8.8*   Liver Function Tests:  Recent Labs Lab 03/31/17 0500  ALBUMIN 3.4*   No results for input(s): LIPASE, AMYLASE in the last 168 hours. No results for input(s): AMMONIA in the last 168 hours. CBC:  Recent Labs Lab 03/30/17 1313 03/31/17 0500 04/01/17 0428 04/01/17 1438 04/02/17 0500 04/03/17 0022  WBC 12.9* 19.5* 10.8*  --  9.3 7.8  NEUTROABS 10.2*  --   --   --   --  5.9  HGB 10.0* 10.4* 8.0* 7.6* 9.2* 8.5*  HCT 31.0* 33.6* 25.6* 23.9* 28.3* 26.3*  MCV 86.6 90.8  89.8  --  86.3 86.2  PLT 253 325 214  --  142* 154   Cardiac Enzymes:  Recent Labs Lab 03/30/17 1313 03/31/17 0500 04/02/17 1324 04/02/17 1836 04/03/17 0022  CKTOTAL 176 185  --   --   --   TROPONINI  --   --  <0.03 0.04* 0.04*   BNP: Invalid input(s): POCBNP CBG:  Recent Labs Lab 04/02/17 2029 04/03/17 0032 04/03/17 0436 04/03/17 0850 04/03/17 1151  GLUCAP 209* 211* 114* 137* 197*   D-Dimer No results for input(s): DDIMER in the last 72 hours. Hgb A1c No results for input(s): HGBA1C in the last 72 hours. Lipid Profile No results for input(s): CHOL, HDL, LDLCALC, TRIG, CHOLHDL,  LDLDIRECT in the last 72 hours. Thyroid function studies No results for input(s): TSH, T4TOTAL, T3FREE, THYROIDAB in the last 72 hours.  Invalid input(s): FREET3 Anemia work up  Recent Labs  04/01/17 0428  VITAMINB12 1,493*  FOLATE 10.3  FERRITIN 1,829*  TIBC 318  IRON 34   Urinalysis    Component Value Date/Time   COLORURINE YELLOW 03/31/2017 [place] 03/31/2017 1555   LABSPEC 1.011 03/31/2017 1555   PHURINE 5.0 03/31/2017 1555   GLUCOSEU >=500 (A) 03/31/2017 1555   GLUCOSEU 100 (A) 08/21/2016 1602   HGBUR NEGATIVE 03/31/2017 1555   BILIRUBINUR NEGATIVE 03/31/2017 1555   KETONESUR NEGATIVE 03/31/2017 1555   PROTEINUR NEGATIVE 03/31/2017 1555   UROBILINOGEN 0.2 08/21/2016 1602   NITRITE NEGATIVE 03/31/2017 1555   LEUKOCYTESUR NEGATIVE 03/31/2017 1555   Sepsis Labs Invalid input(s): PROCALCITONIN,  WBC,  LACTICIDVEN Microbiology Recent Results (from the past 240 hour(s))  Surgical pcr screen     Status: None   Collection Time: 03/30/17 10:20 PM  Result Value Ref Range Status   MRSA, PCR NEGATIVE NEGATIVE Final   Staphylococcus aureus NEGATIVE NEGATIVE Final    Comment:        The Xpert SA Assay (FDA approved for NASAL specimens in patients over 23 years of age), is one component of a comprehensive surveillance program.  Test performance has been validated by [facility] for patients greater than or equal to 28 year old. It is not intended to diagnose infection nor to guide or monitor treatment.   Urine culture     Status: None   Collection Time: 03/31/17  3:55 PM  Result Value Ref Range Status   Specimen Description URINE, RANDOM  Final   Special Requests NONE  Final   Culture NO GROWTH  Final   Report Status 04/01/2017 FINAL  Final     Time coordinating discharge: 31 minutes  SIGNED:   Rosita Fire, MD  [facility] 04/03/2017, 12:19 PM  If 7PM-7AM, please contact night-coverage [email or web address] Password [email or web address]

## 2017-04-30 ENCOUNTER — Ambulatory Visit: Payer: Medicare Other | Admitting: Internal Medicine

## 2017-05-30 ENCOUNTER — Other Ambulatory Visit: Payer: Self-pay

## 2017-05-30 MED ORDER — LIRAGLUTIDE 18 MG/3ML ~~LOC~~ SOPN: PEN_INJECTOR | SUBCUTANEOUS | 0 refills | Status: DC

## 2017-06-07 ENCOUNTER — Ambulatory Visit: Payer: Medicare Other | Admitting: "Endocrinology

## 2017-06-17 ENCOUNTER — Other Ambulatory Visit: Payer: Self-pay | Admitting: "Endocrinology

## 2017-06-18 LAB — COMPREHENSIVE METABOLIC PANEL
ALBUMIN: 4.4 g/dL (ref 3.6–4.8)
ALK PHOS: 77 IU/L (ref 39–117)
ALT: 43 IU/L — ABNORMAL HIGH (ref 0–32)
AST: 42 IU/L — AB (ref 0–40)
Albumin/Globulin Ratio: 1.7 (ref 1.2–2.2)
BILIRUBIN TOTAL: 0.3 mg/dL (ref 0.0–1.2)
BUN / CREAT RATIO: 21 (ref 12–28)
BUN: 34 mg/dL — AB (ref 8–27)
CHLORIDE: 104 mmol/L (ref 96–106)
CO2: 18 mmol/L — ABNORMAL LOW (ref 20–29)
Calcium: 9.7 mg/dL (ref 8.7–10.3)
Creatinine, Ser: 1.64 mg/dL — ABNORMAL HIGH (ref 0.57–1.00)
GFR calc Af Amer: 38 mL/min/{1.73_m2} — ABNORMAL LOW (ref >59)
GFR calc non Af Amer: 33 mL/min/{1.73_m2} — ABNORMAL LOW (ref >59)
GLOBULIN, TOTAL: 2.6 g/dL (ref 1.5–4.5)
GLUCOSE: 46 mg/dL — AB (ref 65–99)
Potassium: 3.7 mmol/L (ref 3.5–5.2)
SODIUM: 143 mmol/L (ref 134–144)
Total Protein: 7 g/dL (ref 6.0–8.5)

## 2017-06-24 ENCOUNTER — Ambulatory Visit: Payer: Medicare Other | Admitting: "Endocrinology

## 2017-06-26 ENCOUNTER — Encounter (INDEPENDENT_AMBULATORY_CARE_PROVIDER_SITE_OTHER): Payer: Self-pay

## 2017-06-26 ENCOUNTER — Ambulatory Visit (INDEPENDENT_AMBULATORY_CARE_PROVIDER_SITE_OTHER): Payer: Medicare Other | Admitting: Internal Medicine

## 2017-06-26 ENCOUNTER — Encounter: Payer: Self-pay | Admitting: Internal Medicine

## 2017-06-26 DIAGNOSIS — R112 Nausea with vomiting, unspecified: Secondary | ICD-10-CM

## 2017-06-26 DIAGNOSIS — K580 Irritable bowel syndrome with diarrhea: Secondary | ICD-10-CM | POA: Diagnosis not present

## 2017-06-26 MED ORDER — ONDANSETRON HCL 4 MG PO TABS: ORAL_TABLET | ORAL | 1 refills | Status: DC

## 2017-06-26 NOTE — Patient Instructions (Signed)
  We have sent the following medications to your pharmacy for you to pick up at your convenience: Generic Zofran   Call and update [place] with your symptoms and we will decide when you need follow up.    I appreciate the opportunity to care for you. Silvano Rusk, MD, [facility]

## 2017-06-26 NOTE — Progress Notes (Addendum)
Heather [name] 66 y.o. [birth year] [id number]  Assessment & Plan:   Irritable bowel syndrome Improved but still w/ sxs - alosetron did not work at 1 mg bid either No  GB so Viberzi not an option Will try prn ondansetron 4- 8 mg q 6 prn which has been shown to help IBS D in case series. She is to call with an update and will then set f/u If this is not helpful in center treatment with Xifaxan or other antibiotic.  Chronic Nausea and Vomiting - ? gastroapresis Better on metaclopramide Advised try to minimize dosing Discussed possible side efects like neuropathic pain, tardive dyskinesia  RTC w/in 6 mos   I appreciate the opportunity to care for this patient.  CC: [name], MD    Subjective:   Chief Complaint:Diarrhea  HPI The patient is here for follow-up of irritable bowel syndrome with diarrhea and chronic nausea and vomiting question gastroparesis. Reglan is working well for her nausea and vomiting. She takes it from 1-3 times a day. No complaints of neuropathy or tardive dyskinesia-like problems. So that she is done in a while with this. After I saw her she had a femur fracture and she had that repaired, and was hospitalized briefly in June. I had seen her in May. Her diarrhea is better most days she is okay but 2 or 3 days out of the week she'll have lower abdominal cramping nausea and and diarrhea. Lotronex at 0.5 and 1 mg twice a day did not really make a difference except transiently at the 0.5 mg dose. Colonoscopy with random biopsies and terminal ileum exam in 2017 negative. Long-standing issue. Again overall she is better. She is not entirely aware of all of her medications but when asked about a little white pill which we think is Lomotil that doesn't seem to be helping much either.  Wt Readings from Last 3 Encounters:  06/26/17 158 lb 6.4 oz (71.8 kg)  03/30/17 164 lb (74.4 kg)  03/08/17 164 lb 2 oz (74.4 kg)    Allergies  Allergen Reactions  .  Duloxetine Hcl Swelling  . Fluocinolone Swelling  . Acetaminophen Itching and Swelling    Pt states "some swelling"  . Amlodipine Besylate Swelling    Swelling of feet, fluid retention  . Ciprofloxacin Nausea And Vomiting and Swelling    Swelling of face, jaw, and lips  . Hydrocodone-Acetaminophen Itching  . Metformin Other (See Comments)    REACTION: GI UPSET  . Nsaids Other (See Comments)    "HAS BLEEDING ULCERS"  . Quinolones Swelling  . Sulfamethoxazole Swelling    Swelling of feet, legs  . Sulfonamide Derivatives Swelling  . Valsartan Swelling  . Cozaar [Losartan] Swelling and Rash  . Lisinopril Swelling and Rash    Oral swelling, and red streaks on arms/stomach   Current Meds  Medication Sig  . allopurinol (ZYLOPRIM) 100 MG tablet Take 100 mg by mouth daily.  Marland Kitchen aspirin EC 81 MG tablet Take 81 mg by mouth daily.  Marland Kitchen atorvastatin (LIPITOR) 80 MG tablet Take 80 mg by mouth daily.  . blood glucose meter kit and supplies KIT Dispense based on patient and insurance preference. Use up to four times daily as directed. (FOR ICD-10 E11.65)  . Blood Glucose Monitoring Suppl (ONETOUCH VERIO) w/Device KIT 1 each by Does not apply route as needed. Test 4 times daily  . cholecalciferol (VITAMIN D) 1000 units tablet Take 1,000 Units by mouth daily.  . citalopram (CELEXA) 40 MG  tablet Take 0.5 tablets (20 mg total) by mouth daily.  . cyanocobalamin (,VITAMIN B-12,) 1000 MCG/ML injection Inject 1,000 mcg into the muscle every 30 (thirty) days.    . diazepam (VALIUM) 5 MG tablet Take 1 tablet (5 mg total) by mouth 2 (two) times daily as needed for anxiety.  . diphenoxylate-atropine (LOMOTIL) 2.5-0.025 MG tablet Take 2 tablets by mouth 4 (four) times daily as needed for diarrhea or loose stools.  Marland Kitchen esomeprazole (NEXIUM) 40 MG capsule Take 40 mg by mouth daily before breakfast.    . fenofibrate (TRICOR) 145 MG tablet Take 1 tablet (145 mg total) by mouth daily.  . ferrous sulfate 325 (65 FE) MG  tablet Take 1 tablet (325 mg total) by mouth 2 (two) times daily with a meal.  . furosemide (LASIX) 40 MG tablet Take 1 tablet (40 mg total) by mouth daily.  Marland Kitchen glucose blood (ONE TOUCH TEST STRIPS) test strip Use as instructed 4 x daily. E11.65  . Insulin Detemir (LEVEMIR FLEXTOUCH) 100 UNIT/ML Pen Inject 80 Units into the skin at bedtime.  . Insulin Lispro (HUMALOG KWIKPEN [place]) Inject into the skin 3 (three) times daily with meals. Sliding scale  . liraglutide (VICTOZA) 18 MG/3ML SOPN USE 1.8 MG EVERY DAY AS DIRECTED  . methocarbamol (ROBAXIN) 500 MG tablet Take 1 tablet (500 mg total) by mouth every 6 (six) hours as needed for muscle spasms.  . metoCLOPramide (REGLAN) 10 MG tablet Take 1 tablet (10 mg total) by mouth 3 (three) times daily before meals.  . metoprolol (LOPRESSOR) 100 MG tablet Take 100 mg by mouth 2 (two) times daily.   Marland Kitchen omega-3 acid ethyl esters (LOVAZA) 1 g capsule Take 1 capsule (1 g total) by mouth 2 (two) times daily.  . potassium chloride SA (K-DUR,KLOR-CON) 20 MEQ tablet Take 20 mEq by mouth daily.  Marland Kitchen tiZANidine (ZANAFLEX) 4 MG tablet Take 1 tablet (4 mg total) by mouth at bedtime.  . [DISCONTINUED] alosetron (LOTRONEX) 0.5 MG tablet Take 0.5 mg by mouth 2 (two) times daily.  . [DISCONTINUED] enoxaparin (LOVENOX) 30 MG/0.3ML injection Inject 0.3 mLs (30 mg total) into the skin daily.   Past Medical History:  Diagnosis Date  . Acute renal failure superimposed on stage 3 chronic kidney disease ([place]) 01/21/2016  . B12 deficiency   . Cellulitis and abscess    Abdomen and buttocks  . CKD (chronic kidney disease) stage 3, GFR 30-59 ml/min   . Closed fracture of distal end of femur, unspecified fracture morphology, initial encounter ([place])   . Depression   . Diabetic neuropathy ([place])   . DJD (degenerative joint disease)    Right forminal stenosis C4-5  . Essential hypertension   . Femur fracture, right ([place]) 03/30/2017  . Folliculitis   . Gout   . Headache(784.0)   .  Hiatal hernia   . History of bronchitis   . History of cardiac catheterization    No significant CAD 2012  . Insomnia   . Irritable bowel syndrome   . Mixed hyperlipidemia   . Osteoarthritis   . Palpitations   . Reflux esophagitis   . Tinnitus    Right  . Type 2 diabetes mellitus [facility])    Past Surgical History:  Procedure Laterality Date  . c-spine surgery     03/2007  . CARDIAC CATHETERIZATION    . CHOLECYSTECTOMY    . COLONOSCOPY  04/21/2011; 05/29/11   6/12: [place] - ?AVM at IC valve, inflammatory changes at [facility] valve [facility]);  7/12 - [facility]; IC valve erosions, look chronic and probably Crohn's per path  . ESOPHAGOGASTRODUODENOSCOPY  05/29/11   normal  . ESOPHAGOGASTRODUODENOSCOPY N/A 01/21/2016   Procedure: ESOPHAGOGASTRODUODENOSCOPY (EGD);  Surgeon: Wilford Corner, MD;  Location: [facility] ENDOSCOPY;  Service: Endoscopy;  Laterality: N/A;  . IR FLUORO GUIDE CV LINE RIGHT  04/01/2017  . IR US GUIDE VASC ACCESS RIGHT  04/01/2017  . JOINT REPLACEMENT    . ORIF FEMUR FRACTURE Right 03/31/2017   Procedure: OPEN REDUCTION INTERNAL FIXATION (ORIF) DISTAL FEMUR FRACTURE;  Surgeon: Paralee Cancel, MD;  Location: [facility];  Service: Orthopedics;  Laterality: Right;  . Right knee arthroscopic surgery    . TOTAL ABDOMINAL HYSTERECTOMY     Review of Systems No fevers. She is recovering from her surgery doing well with that overall.  Objective:   Physical Exam BP (!) 144/70   Pulse 76   Ht '5\' 2"'  (1.575 m)   Wt 158 lb 6.4 oz (71.8 kg)   BMI 28.97 kg/m  She is in no acute distress well-developed well-nourished middle-aged white woman Eyes anicteric mood and affect are appropriate There are no signs of tardive dyskinesia  15 minutes time spent with patient > half in counseling coordination of care

## 2017-06-26 NOTE — Assessment & Plan Note (Signed)
Better on metaclopramide Advised try to minimize dosing Discussed possible side efects like neuropathic pain, tardive dyskinesia  RTC w/in 6 mos

## 2017-06-26 NOTE — Assessment & Plan Note (Addendum)
Improved but still w/ sxs - alosetron did not work at 1 mg bid either No  GB so Viberzi not an option Will try prn ondansetron 4- 8 mg q 6 prn which has been shown to help IBS D in case series. She is to call with an update and will then set f/u If this is not helpful in center treatment with Xifaxan or other antibiotic.

## 2017-06-28 ENCOUNTER — Telehealth: Payer: Self-pay | Admitting: Internal Medicine

## 2017-06-28 DIAGNOSIS — E781 Pure hyperglyceridemia: Secondary | ICD-10-CM

## 2017-06-28 NOTE — Telephone Encounter (Signed)
Left a message for patient to return my call. 

## 2017-06-28 NOTE — Telephone Encounter (Signed)
Spoke with patient and informed her that I can send this message to Dr. Carlean Purl to ask for alternative to Zofran. What medication would like patient to be switched to?

## 2017-07-02 MED ORDER — PANCRELIPASE (LIP-PROT-AMYL) 36000-114000 UNITS PO CPEP: 36000.0000 [IU] | ORAL_CAPSULE | Freq: Three times a day (TID) | ORAL | 0 refills | Status: DC

## 2017-07-02 NOTE — Telephone Encounter (Signed)
Please have her come get 1-2 weeks of samples of Creon 36 K  Try 1 with meals and let me know what that does  If she has not had her triglycerides rechecked since I did so in the Spring I want to have her do a fasting lipid panel dx hypertriglyceridemia  If she has done one I would like to know where so we can get the results

## 2017-07-02 NOTE — Telephone Encounter (Signed)
Spoke with Heather [name] and its too far to drive for samples so she wants me to send in the Creon to [facility] for her to try.  She had a recent lipid panel at her PCP. I'll try and obtain that tomorrow.

## 2017-07-02 NOTE — Addendum Note (Signed)
Addended by: [place], Jaemarie Hochberg E on: 07/02/2017 05:29 PM   Modules accepted: Orders

## 2017-07-03 NOTE — Telephone Encounter (Signed)
[name] called Dr Trena Platt office and now they have new rules and can't draw her lab work so order faxed to [facility] at fax # [phone] for them to draw the lipid panel and fax [place] the results.  Their phone # is # [phone].  They are open Mon-Friday 8-5pm, pt aware and will try to go tomorrow.

## 2017-07-03 NOTE — Telephone Encounter (Signed)
  Spoke with her PCP office , Dr Manuella Ghazi in [place].  No lipid panel was drawn recently, just a CMET.  Faxed an order to [facility]: Christy for them to draw a fasting lipid panel and fax [place] the results.  I informed [name] and she will go there and get this drawn.

## 2017-07-03 NOTE — Addendum Note (Signed)
Addended by: [place], Kyshon Tolliver E on: 07/03/2017 09:18 AM   Modules accepted: Orders

## 2017-07-04 ENCOUNTER — Encounter: Payer: Self-pay | Admitting: Internal Medicine

## 2017-07-09 ENCOUNTER — Telehealth: Payer: Self-pay

## 2017-07-09 MED ORDER — CHOLESTYRAMINE LIGHT 4 G PO PACK: 4.0000 g | PACK | Freq: Every day | ORAL | 3 refills | Status: DC

## 2017-07-09 NOTE — Telephone Encounter (Signed)
    Needs to show the lipids to Dr. Carolyn Stare and let us send this note and last note to Dr. Carolyn Stare also please  1) Stop Creon 2) Add cholestyramine light 4 g packet daily with supper - this helps triglycerides and the diarrhea - needs to take other meds 1 hr before at least or at least 4 hrs later # 30 3 RF 3) I want her to take 2 IB Gard twice a day at breakfast and supper she can read what is on the box - this is to help with the pain 4) If she does not have f/u me 2 months from now please 5) Good work on blood sugars I think that is better

## 2017-07-09 NOTE — Addendum Note (Signed)
Addended by: Marlon Pel on: 07/09/2017 05:18 PM   Modules accepted: Orders

## 2017-07-09 NOTE — Telephone Encounter (Signed)
Patient will see Dr. Dorris Fetch on Thursday, my mistake is spelling Follow up scheduled for 09/13/17 She is notified of new recommendations.

## 2017-07-09 NOTE — Telephone Encounter (Signed)
-----   Message from Gatha Mayer, MD sent at 07/09/2017  6:49 AM EDT ----- Regarding: lipid results Let her know te TG's are improved but still too high.level 393  1) Does she know what last A1 C was and/or how are sugars running 2) When does she see [name] PCP again? 3) Would she be willing to see an endocrinologist in [place] for lipids and DM?

## 2017-07-09 NOTE — Telephone Encounter (Signed)
Her last A1C was 7.8 last month.  She reports that her blood sugars are in the 130-140 fasting range. She will see [name] in about 2-3 months She sees Dr. Carolyn Stare in [place] for her diabetes and sees him this Thursday.  Creon is not helping her diarrhea or her abdominal pain and bloating.

## 2017-07-11 ENCOUNTER — Ambulatory Visit: Payer: Medicare Other | Admitting: "Endocrinology

## 2017-07-25 ENCOUNTER — Ambulatory Visit: Payer: Medicaid Other | Admitting: "Endocrinology

## 2017-07-29 ENCOUNTER — Ambulatory Visit (INDEPENDENT_AMBULATORY_CARE_PROVIDER_SITE_OTHER): Payer: Medicare Other | Admitting: Cardiology

## 2017-07-29 ENCOUNTER — Encounter: Payer: Self-pay | Admitting: Cardiology

## 2017-07-29 VITALS — BP 138/78 | HR 98 | Ht 63.0 in | Wt 157.2 lb

## 2017-07-29 DIAGNOSIS — E782 Mixed hyperlipidemia: Secondary | ICD-10-CM

## 2017-07-29 DIAGNOSIS — R002 Palpitations: Secondary | ICD-10-CM | POA: Diagnosis not present

## 2017-07-29 DIAGNOSIS — I10 Essential (primary) hypertension: Secondary | ICD-10-CM | POA: Diagnosis not present

## 2017-07-29 DIAGNOSIS — R0789 Other chest pain: Secondary | ICD-10-CM

## 2017-07-29 NOTE — Progress Notes (Signed)
Clinical Summary Heather [name] is a 66 y.o.female seen today for follow up of the following medical problems.   1. Chronic chest pain - normal cath 2003, 2005, 2012   Negative nuclear stress test in 2010 and 05/2013 - 11/2016 echo normal LVEF, no WMAs  - still with chest pain at times. Nonspecific chest pressure consistent with prior symptoms.    2. Palpitations - can have some fluttering feelings at times. Occurs few times a week. Lasts just a few seconds. - new onset started about 1 year ago. Can occur at rest or with exertion - limited caffeine, no EtOH  3. HTN - home bp's 130s/70s =- compliant with meds  4. Hyperlipidemia - she is compliant - on cholystermine for diarrhea by her GI doctor - 06/2017 TG 393 TC 175 HDL 46 LDL 51  4. DM2 - 03/2017 HgbA1c 7.6 - allergies to ACE-I/ARB  5. CKD - Cr 1.64 last check  6. IBS/Chronic N/V - followed by GI  7. Syncope - worked up at [facility] cardiology. There notes mention a nagetive cardiac monitor - no recent symptoms  8. Cervical radiculopathy - followed by ortho  9. Leg fracture - recent surgery 03/2017 after fall Past Medical History:  Diagnosis Date  . Acute renal failure superimposed on stage 3 chronic kidney disease ([place]) 01/21/2016  . B12 deficiency   . Cellulitis and abscess    Abdomen and buttocks  . CKD (chronic kidney disease) stage 3, GFR 30-59 ml/min   . Closed fracture of distal end of femur, unspecified fracture morphology, initial encounter ([place])   . Depression   . Diabetic neuropathy ([place])   . DJD (degenerative joint disease)    Right forminal stenosis C4-5  . Essential hypertension   . Femur fracture, right ([place]) 03/30/2017  . Folliculitis   . Gout   . Headache(784.0)   . Hiatal hernia   . History of bronchitis   . History of cardiac catheterization    No significant CAD 2012  . Insomnia   . Irritable bowel syndrome   . Mixed hyperlipidemia   . Osteoarthritis   . Palpitations   . Reflux  esophagitis   . Tinnitus    Right  . Type 2 diabetes mellitus (HCC)      Allergies  Allergen Reactions  . Duloxetine Hcl Swelling  . Fluocinolone Swelling  . Acetaminophen Itching and Swelling    Pt states "some swelling"  . Amlodipine Besylate Swelling    Swelling of feet, fluid retention  . Ciprofloxacin Nausea And Vomiting and Swelling    Swelling of face, jaw, and lips  . Hydrocodone-Acetaminophen Itching  . Metformin Other (See Comments)    REACTION: GI UPSET  . Nsaids Other (See Comments)    "HAS BLEEDING ULCERS"  . Quinolones Swelling  . Sulfamethoxazole Swelling    Swelling of feet, legs  . Sulfonamide Derivatives Swelling  . Valsartan Swelling  . Cozaar [Losartan] Swelling and Rash  . Lisinopril Swelling and Rash    Oral swelling, and red streaks on arms/stomach     Current Outpatient Prescriptions  Medication Sig Dispense Refill  . allopurinol (ZYLOPRIM) 100 MG tablet Take 100 mg by mouth daily.    Marland Kitchen aspirin EC 81 MG tablet Take 81 mg by mouth daily.    Marland Kitchen atorvastatin (LIPITOR) 80 MG tablet Take 80 mg by mouth daily.    . blood glucose meter kit and supplies KIT Dispense based on patient and insurance preference. Use up to  four times daily as directed. (FOR ICD-10 E11.65) 1 each 0  . Blood Glucose Monitoring Suppl (ONETOUCH VERIO) w/Device KIT 1 each by Does not apply route as needed. Test 4 times daily 1 kit 0  . cholecalciferol (VITAMIN D) 1000 units tablet Take 1,000 Units by mouth daily.    . cholestyramine light (PREVALITE) 4 g packet Take 1 packet (4 g total) by mouth daily with supper. 30 packet 3  . citalopram (CELEXA) 40 MG tablet Take 0.5 tablets (20 mg total) by mouth daily.    . cyanocobalamin (,VITAMIN B-12,) 1000 MCG/ML injection Inject 1,000 mcg into the muscle every 30 (thirty) days.      . diazepam (VALIUM) 5 MG tablet Take 1 tablet (5 mg total) by mouth 2 (two) times daily as needed for anxiety. 6 tablet 0  . diphenoxylate-atropine (LOMOTIL)  2.5-0.025 MG tablet Take 2 tablets by mouth 4 (four) times daily as needed for diarrhea or loose stools. 30 tablet 0  . esomeprazole (NEXIUM) 40 MG capsule Take 40 mg by mouth daily before breakfast.      . fenofibrate (TRICOR) 145 MG tablet Take 1 tablet (145 mg total) by mouth daily. 90 tablet 3  . ferrous sulfate 325 (65 FE) MG tablet Take 1 tablet (325 mg total) by mouth 2 (two) times daily with a meal.  3  . furosemide (LASIX) 40 MG tablet Take 1 tablet (40 mg total) by mouth daily. 30 tablet 0  . glucose blood (ONE TOUCH TEST STRIPS) test strip Use as instructed 4 x daily. E11.65 150 each 3  . Insulin Detemir (LEVEMIR FLEXTOUCH) 100 UNIT/ML Pen Inject 80 Units into the skin at bedtime.    . Insulin Lispro (HUMALOG KWIKPEN [place]) Inject into the skin 3 (three) times daily with meals. Sliding scale    . liraglutide (VICTOZA) 18 MG/3ML SOPN USE 1.8 MG EVERY DAY AS DIRECTED 27 mL 0  . methocarbamol (ROBAXIN) 500 MG tablet Take 1 tablet (500 mg total) by mouth every 6 (six) hours as needed for muscle spasms. 30 tablet 0  . metoCLOPramide (REGLAN) 10 MG tablet Take 1 tablet (10 mg total) by mouth 3 (three) times daily before meals. 90 tablet 1  . metoprolol (LOPRESSOR) 100 MG tablet Take 100 mg by mouth 2 (two) times daily.     Marland Kitchen omega-3 acid ethyl esters (LOVAZA) 1 g capsule Take 1 capsule (1 g total) by mouth 2 (two) times daily. 60 capsule 0  . ondansetron (ZOFRAN) 4 MG tablet 1-2 every 6 hrs as needed for nausea, cramps, diarrhea 30 tablet 1  . potassium chloride SA (K-DUR,KLOR-CON) 20 MEQ tablet Take 20 mEq by mouth daily.    Marland Kitchen tiZANidine (ZANAFLEX) 4 MG tablet Take 1 tablet (4 mg total) by mouth at bedtime. 30 tablet 0   No current facility-administered medications for this visit.      Past Surgical History:  Procedure Laterality Date  . c-spine surgery     03/2007  . CARDIAC CATHETERIZATION    . CHOLECYSTECTOMY    . COLONOSCOPY  04/21/2011; 05/29/11   6/12: [place] - ?AVM at IC valve,  inflammatory changes at [facility] valve [facility]); 7/12 - gessner; IC valve erosions, look chronic and probably Crohn's per path  . ESOPHAGOGASTRODUODENOSCOPY  05/29/11   normal  . ESOPHAGOGASTRODUODENOSCOPY N/A 01/21/2016   Procedure: ESOPHAGOGASTRODUODENOSCOPY (EGD);  Surgeon: Wilford Corner, MD;  Location[facility] ENDOSCOPY;  Service: Endoscopy;  Laterality: N/A;  . IR FLUORO GUIDE CV LINE RIGHT  04/01/2017  .  IR US GUIDE VASC ACCESS RIGHT  04/01/2017  . JOINT REPLACEMENT    . ORIF FEMUR FRACTURE Right 03/31/2017   Procedure: OPEN REDUCTION INTERNAL FIXATION (ORIF) DISTAL FEMUR FRACTURE;  Surgeon: Paralee Cancel, MD;  Location: [place];  Service: Orthopedics;  Laterality: Right;  . Right knee arthroscopic surgery    . TOTAL ABDOMINAL HYSTERECTOMY       Allergies  Allergen Reactions  . Duloxetine Hcl Swelling  . Fluocinolone Swelling  . Acetaminophen Itching and Swelling    Pt states "some swelling"  . Amlodipine Besylate Swelling    Swelling of feet, fluid retention  . Ciprofloxacin Nausea And Vomiting and Swelling    Swelling of face, jaw, and lips  . Hydrocodone-Acetaminophen Itching  . Metformin Other (See Comments)    REACTION: GI UPSET  . Nsaids Other (See Comments)    "HAS BLEEDING ULCERS"  . Quinolones Swelling  . Sulfamethoxazole Swelling    Swelling of feet, legs  . Sulfonamide Derivatives Swelling  . Valsartan Swelling  . Cozaar [Losartan] Swelling and Rash  . Lisinopril Swelling and Rash    Oral swelling, and red streaks on arms/stomach      Family History  Problem Relation Age of Onset  . Diabetes Mother   . Colon cancer Neg Hx      Social History Ms. Zwack reports that she has never smoked. She has never used smokeless tobacco. Ms. Koors reports that she does not drink alcohol.   Review of Systems CONSTITUTIONAL: No weight loss, fever, chills, weakness or fatigue.  HEENT: Eyes: No visual loss, blurred vision, double vision or yellow sclerae.No  hearing loss, sneezing, congestion, runny nose or sore throat.  SKIN: No rash or itching.  CARDIOVASCULAR: per hpi RESPIRATORY: No shortness of breath, cough or sputum.  GASTROINTESTINAL: occasional N/V  GENITOURINARY: No burning on urination, no polyuria NEUROLOGICAL: No headache, dizziness, syncope, paralysis, ataxia, numbness or tingling in the extremities. No change in bowel or bladder control.  MUSCULOSKELETAL: No muscle, back pain, joint pain or stiffness.  LYMPHATICS: No enlarged nodes. No history of splenectomy.  PSYCHIATRIC: No history of depression or anxiety.  ENDOCRINOLOGIC: No reports of sweating, cold or heat intolerance. No polyuria or polydipsia.  Marland Kitchen   Physical Examination Vitals:   07/29/17 0842  BP: 138/78  Pulse: 98   Vitals:   07/29/17 0842  Weight: 157 lb 3.2 oz (71.3 kg)  Height: '5\' 3"'  (1.6 m)    Gen: resting comfortably, no acute distress HEENT: no scleral icterus, pupils equal round and reactive, no palptable cervical adenopathy,  CV: RRR, no m/r/g, no jvd Resp: Clear to auscultation bilaterally GI: abdomen is soft, non-tender, non-distended, normal bowel sounds, no hepatosplenomegaly MSK: extremities are warm, no edema.  Skin: warm, no rash Neuro:  no focal deficits Psych: appropriate affect   Diagnostic Studies  Cath 2005: normal coronaries  05/2013 nuclear stress: no ischemia  04/2017 echo Study Conclusions  - Left ventricle: The cavity size was normal. There was mild concentric hypertrophy. Systolic function was normal. The estimated ejection fraction was in the range of 55% to 60%. Wall motion was normal; there were no regional wall motion abnormalities. Doppler parameters are consistent with abnormal left ventricular relaxation (grade 1 diastolic dysfunction). Borderline evidence of increased filling pressures. - Aortic valve: Trileaflet; mildly thickened leaflets. No significant regurgitation. Mean gradient: 31m Hg  (S). Valve area: 2.38cm^2 (Vmax). - Mitral valve: Mild regurgitation. Valve area by continuity equation (using LVOT flow): 4.83cm^2. - Left atrium: The atrium  was mildly dilated. - Right atrium: Central venous pressure: 21m Hg (est). - Tricuspid valve: Mild regurgitation. - Pulmonary arteries: PA peak pressure: 344mHg (S). - Pericardium, extracardiac: There was no pericardial effusion. Impressions:  - Unable to compare with previous study from December 2012. There is mild LVH with LVEF 5530-86%grade 1 diastolic dysfunction and borderline evidence of increased filling pressures. Mild left atrial enlargement. Mild mitral regurgitation. Mild tricuspid regurgitation with PASP 32 mm mercury and normal CVP. Transthoracic echocardiography. M-mode, complete 2D, spectral Doppler, and color Doppler. Height: Height: 157.5cm. Height: 62in. Weight: Weight: 77.1kg. Weight: 169.6lb. Body mass index: BMI: 31.1kg/m^2. Body surface area:  BSA: 1.7846m Blood pressure:   150/85. Patient status: Outpatient.   11/2016 echo Study Conclusions  - Left ventricle: The cavity size was normal. Wall thickness was   increased in a pattern of mild LVH. Systolic function was   vigorous. The estimated ejection fraction was in the range of 65%   to 70%. Wall motion was normal; there were no regional wall   motion abnormalities. The study is not technically sufficient to   allow evaluation of LV diastolic function. - Aortic valve: Mildly calcified annulus. Trileaflet; mildly   calcified leaflets. - Mitral valve: Calcified annulus. There was trivial regurgitation. - Right atrium: Central venous pressure (est): 3 mm Hg. - Atrial septum: No defect or patent foramen ovale was identified. - Tricuspid valve: There was trivial regurgitation. - Pulmonary arteries: Systolic pressure could not be accurately   estimated. - Pericardium, extracardiac: There was no pericardial  effusion.  Impressions:  - Mild LVH with LVEF 65-70%, no regional wall motion abnormalities.   Indeterminate diastolic function. Mildly calcified mitral annulus   with trivial mitral regurgitation. Mildly calcified aortic valve.   Trivial tricuspid regurgitation.  11/2010 cath HEMODYNAMIC DATA: 1. Right atrial pressure 9-11. 2. RV 32/9-12. 3. Pulmonary artery 32/18, mean 24. 4. Pulmonary capillary wedge 17. 5. Aortic 111/60, mean 81. 6. LV 102/17. 7. No gradient pullback across aortic valve. 8. No mitral valve gradient and simultaneous pressure measurements. 9. Superior vena cava saturation 52%. 10.Pulmonary artery saturation 52%. 11.Aortic saturation 92%. 12.Fick cardiac output 4.7 liters per minute. 13.Fick cardiac index 2.6 liters per minute per meter squared. 14.Thermodilution cardiac output 3.9 liters per minute. 15.Thermodilution cardiac index 2.1 liters per minute per meter     squared.  ANGIOGRAPHIC DATA: 1. The left main coronary artery is free of critical disease. 2. The LAD courses towards the apex.  There are twin vessels down over     the anteroseptal, anterolateral segment.  One is the diagonal, the     other is likely the LAD with multiple septal perforators.  This     vessel was smooth throughout without significant focal narrowing. 3. The circumflex provides a large marginal Wentworth Edelen and a smaller AV     circumflex, both of which appear free of critical disease. 4. The right coronary artery is moderately large vessel providing     posterior descending and posterolateral Fiore Detjen.  There may be some     minor plaquing at the bifurcation of the PLA, PDA, but no     significant areas of high-grade focal stenosis noted.  CONCLUSION: 1. Borderline elevation of right heart pressures without significant     pulmonary hypertension. 2. No evidence of either aortic or mitral valve gradient. 3. Somewhat low saturations in the superior vena cava and pulmonary      artery rechecked during the procedure. 4. No significant coronary obstruction.  Assessment and Plan  1. Chest pain - long history, negative ischemic workups in the past - chronic unchanged symptoms, continue to monitor  2. Palpitations - recent symptoms. EKG in clinic shows NSR today - we will obtain a 7 day event monitor  3. HTN - at goal continue current meds  4. Hyperlipidemia - LDL is at goal. TGs remain elevated, likely due to her DM2 not being at goal. Continue to follow. Has been started on TG lowering meds by GI, partly to help with her chronic GI symptoms  5. DM2 - per pcp - from cardiac standpoint she is ASA and statin. SHe is allergic to ACE-I/ARBs     F/u pending monitor results   Arnoldo Lenis, M.D.

## 2017-07-29 NOTE — Patient Instructions (Addendum)
Your physician recommends that you schedule a follow-up appointment in: TO [place]  Your physician recommends that you continue on your current medications as directed. Please refer to the Current Medication list given to you today.  Your physician has recommended that you wear an event monitor FOR 1 WEEK. Event monitors are medical devices that record the heart's electrical activity. Doctors most often [place] these monitors to diagnose arrhythmias. Arrhythmias are problems with the speed or rhythm of the heartbeat. The monitor is a small, portable device. You can wear one while you do your normal daily activities. This is usually used to diagnose what is causing palpitations/syncope (passing out).  Thank you for choosing [facility]!!

## 2017-08-05 ENCOUNTER — Encounter (INDEPENDENT_AMBULATORY_CARE_PROVIDER_SITE_OTHER): Payer: Medicare Other

## 2017-08-05 DIAGNOSIS — R002 Palpitations: Secondary | ICD-10-CM | POA: Diagnosis not present

## 2017-08-19 ENCOUNTER — Telehealth: Payer: Self-pay | Admitting: Cardiology

## 2017-08-19 NOTE — Telephone Encounter (Signed)
Asking for results from monitor

## 2017-08-19 NOTE — Telephone Encounter (Signed)
Results sent to Dr Harl Bowie will forward

## 2017-08-20 ENCOUNTER — Telehealth: Payer: Self-pay | Admitting: *Deleted

## 2017-08-20 NOTE — Telephone Encounter (Signed)
-----   Message from Arnoldo Lenis, MD sent at 08/20/2017  1:40 PM EDT ----- F/u 6 months  JB ----- Message ----- From: Massie Maroon, CMA Sent: 08/20/2017  12:50 PM To: Arnoldo Lenis, MD  When did you want to f/u with this pt?  Rolla Servidio

## 2017-08-20 NOTE — Telephone Encounter (Signed)
Pt says she hasn't had many symptoms and declined medication at this time. Says she would call back if needed. Routed to pcp     Heart monitor looks good, no abnormal rhythms. If she is still having troubles with heart racing would start short acting diltiazem 30mg  bid   Zandra Abts MD

## 2017-08-20 NOTE — Telephone Encounter (Signed)
Recall placed

## 2017-09-13 ENCOUNTER — Ambulatory Visit: Payer: Medicare Other | Admitting: Internal Medicine

## 2018-01-03 ENCOUNTER — Emergency Department (HOSPITAL_COMMUNITY)
Admission: EM | Admit: 2018-01-03 | Discharge: 2018-01-03 | Disposition: A | Discharge status: Home / Self Care | Payer: Medicare Other | Attending: Emergency Medicine | Admitting: Emergency Medicine

## 2018-01-03 DIAGNOSIS — R1032 Left lower quadrant pain: Secondary | ICD-10-CM | POA: Diagnosis not present

## 2018-01-03 DIAGNOSIS — E1122 Type 2 diabetes mellitus with diabetic chronic kidney disease: Secondary | ICD-10-CM | POA: Insufficient documentation

## 2018-01-03 DIAGNOSIS — R109 Unspecified abdominal pain: Secondary | ICD-10-CM

## 2018-01-03 DIAGNOSIS — Z794 Long term (current) use of insulin: Secondary | ICD-10-CM | POA: Insufficient documentation

## 2018-01-03 DIAGNOSIS — N183 Chronic kidney disease, stage 3 (moderate): Secondary | ICD-10-CM | POA: Insufficient documentation

## 2018-01-03 DIAGNOSIS — R1114 Bilious vomiting: Secondary | ICD-10-CM

## 2018-01-03 DIAGNOSIS — E1140 Type 2 diabetes mellitus with diabetic neuropathy, unspecified: Secondary | ICD-10-CM | POA: Diagnosis not present

## 2018-01-03 DIAGNOSIS — Z79899 Other long term (current) drug therapy: Secondary | ICD-10-CM | POA: Diagnosis not present

## 2018-01-03 DIAGNOSIS — I129 Hypertensive chronic kidney disease with stage 1 through stage 4 chronic kidney disease, or unspecified chronic kidney disease: Secondary | ICD-10-CM | POA: Diagnosis not present

## 2018-01-03 DIAGNOSIS — R112 Nausea with vomiting, unspecified: Secondary | ICD-10-CM | POA: Insufficient documentation

## 2018-01-03 DIAGNOSIS — Z7982 Long term (current) use of aspirin: Secondary | ICD-10-CM | POA: Diagnosis not present

## 2018-01-03 LAB — URINALYSIS, ROUTINE W REFLEX MICROSCOPIC
Bilirubin Urine: NEGATIVE
Glucose, UA: 50 mg/dL — AB
Hgb urine dipstick: NEGATIVE
Ketones, ur: NEGATIVE mg/dL
Nitrite: NEGATIVE
PH: 5 (ref 5.0–8.0)
Protein, ur: NEGATIVE mg/dL
Specific Gravity, Urine: 1.012 (ref 1.005–1.030)

## 2018-01-03 LAB — COMPREHENSIVE METABOLIC PANEL
ALBUMIN: 4.3 g/dL (ref 3.5–5.0)
ALT: 31 U/L (ref 14–54)
AST: 38 U/L (ref 15–41)
Alkaline Phosphatase: 70 U/L (ref 38–126)
Anion gap: 17 — ABNORMAL HIGH (ref 5–15)
BILIRUBIN TOTAL: 1.2 mg/dL (ref 0.3–1.2)
BUN: 28 mg/dL — AB (ref 6–20)
CHLORIDE: 100 mmol/L — AB (ref 101–111)
CO2: 23 mmol/L (ref 22–32)
Calcium: 9.5 mg/dL (ref 8.9–10.3)
Creatinine, Ser: 2.06 mg/dL — ABNORMAL HIGH (ref 0.44–1.00)
GFR calc Af Amer: 28 mL/min — ABNORMAL LOW (ref >60)
GFR calc non Af Amer: 24 mL/min — ABNORMAL LOW (ref >60)
GLUCOSE: 305 mg/dL — AB (ref 65–99)
POTASSIUM: 3.1 mmol/L — AB (ref 3.5–5.1)
Sodium: 140 mmol/L (ref 135–145)
TOTAL PROTEIN: 7.7 g/dL (ref 6.5–8.1)

## 2018-01-03 LAB — CBC
HCT: 36.3 % (ref 36.0–46.0)
Hemoglobin: 11.7 g/dL — ABNORMAL LOW (ref 12.0–15.0)
MCH: 29.8 pg (ref 26.0–34.0)
MCHC: 32.2 g/dL (ref 30.0–36.0)
MCV: 92.4 fL (ref 78.0–100.0)
PLATELETS: 263 10*3/uL (ref 150–400)
RBC: 3.93 MIL/uL (ref 3.87–5.11)
RDW: 13.4 % (ref 11.5–15.5)
WBC: 7.5 10*3/uL (ref 4.0–10.5)

## 2018-01-03 MED ORDER — GABAPENTIN 100 MG PO CAPS
100.0000 mg | ORAL_CAPSULE | Freq: Once | ORAL | Status: AC
  Administered 2018-01-03: 100 mg via ORAL
  Filled 2018-01-03: qty 1

## 2018-01-03 MED ORDER — FENTANYL CITRATE (PF) 100 MCG/2ML IJ SOLN
25.0000 ug | Freq: Once | INTRAMUSCULAR | Status: AC
  Administered 2018-01-03: 25 ug via INTRAVENOUS
  Filled 2018-01-03: qty 2

## 2018-01-03 MED ORDER — ONDANSETRON HCL 4 MG/2ML IJ SOLN
4.0000 mg | Freq: Once | INTRAMUSCULAR | Status: AC
  Administered 2018-01-03: 4 mg via INTRAVENOUS
  Filled 2018-01-03: qty 2

## 2018-01-03 MED ORDER — GABAPENTIN 100 MG PO CAPS: 100.0000 mg | ORAL_CAPSULE | Freq: Three times a day (TID) | ORAL | 0 refills | Status: DC

## 2018-01-03 MED ORDER — SODIUM CHLORIDE 0.9 % IV BOLUS (SEPSIS)
1000.0000 mL | Freq: Once | INTRAVENOUS | Status: AC
  Administered 2018-01-03: 1000 mL via INTRAVENOUS

## 2018-01-03 NOTE — ED Provider Notes (Signed)
[facility] Provider Note   CSN: [id number] Arrival date & time: 01/03/18  1518     History   Chief Complaint Chief Complaint  Patient presents with  . Flank Pain    HPI Heather [name] is a 67 y.o. female.  HPI Patient presents with concern of pain about the left flank, left lower back. Onset is unclear, but it seems as though the pain is been there for some time, worse over the past days. Pain is worse with motion. Patient also complains of ongoing generalized discomfort, nausea, vomiting, but notes that this has been present for a long time, and acknowledges a history of gastroparesis.  She states that she was hospitalized at [facility] within the past few weeks for influenza. She notes that since that hospitalization she was doing generally well until this recent infection developed. Today, she states that she spoke with her physician, and after describing the pain, had an evaluation.  When she was found to have hyperglycemia in the office reportedly with glucose greater than 800. She was sent to the emergency department, specifically this 1 for evaluation. She states that though she has allergy to NSAIDs she is Motrin for pain control, without appreciable change in her pain. Other complaints include generalized weakness, fatigue, denies fever, denies hematuria, polyuria, dysuria.   Past Medical History:  Diagnosis Date  . Acute renal failure superimposed on stage 3 chronic kidney disease ([place]) 01/21/2016  . B12 deficiency   . Cellulitis and abscess    Abdomen and buttocks  . CKD (chronic kidney disease) stage 3, GFR 30-59 ml/min (HCC)   . Closed fracture of distal end of femur, unspecified fracture morphology, initial encounter ([place])   . Depression   . Diabetic neuropathy ([place])   . DJD (degenerative joint disease)    Right forminal stenosis C4-5  . Essential hypertension   . Femur fracture, right ([place]) 03/30/2017  . Folliculitis   .  Gout   . Headache(784.0)   . Hiatal hernia   . History of bronchitis   . History of cardiac catheterization    No significant CAD 2012  . Insomnia   . Irritable bowel syndrome   . Mixed hyperlipidemia   . Osteoarthritis   . Palpitations   . Reflux esophagitis   . Tinnitus    Right  . Type 2 diabetes mellitus [facility])     Patient Active Problem List   Diagnosis Date Noted  . Neuropathy 03/02/2015  . Pain in the chest   . Chest pain 12/22/2014  . Essential hypertension 12/22/2014  . Diabetes mellitus with chronic kidney disease ([place]) 12/22/2014  . Hiatal hernia 12/22/2014  . GERD (gastroesophageal reflux disease) 12/22/2014  . Gout 12/22/2014  . Mixed hyperlipidemia 12/22/2014  . Depression 12/22/2014  . Chronic kidney disease, stage III (moderate) ([place]) 08/25/2014  . Vitamin B12 deficiency 07/08/2011  . Personal history of failed moderate sedation 04/16/2011  . Chronic Nausea and Vomiting - ? gastroapresis 04/16/2011  . Early satiety 04/16/2011  . Irritable bowel syndrome 04/16/2011  . Benign paroxysmal positional vertigo 11/20/2010  . TRICUSPID REGURGITATION, MODERATE 11/20/2010  . PULMONARY HYPERTENSION, MILD 11/20/2010  . LEG EDEMA, BILATERAL 11/20/2010  . DYSPNEA 11/20/2010  . NONSPECIFIC ABNORMAL ELECTROCARDIOGRAM 11/20/2010  . CHEST WALL PAIN, HX OF 11/20/2010    Past Surgical History:  Procedure Laterality Date  . c-spine surgery     03/2007  . CARDIAC CATHETERIZATION    . CHOLECYSTECTOMY    . COLONOSCOPY  04/21/2011; 05/29/11   6/12: [place] - ?AVM at IC valve, inflammatory changes at [facility] valve [facility]); 7/12 - [facility]; IC valve erosions, look chronic and probably Crohn's per path  . ESOPHAGOGASTRODUODENOSCOPY  05/29/11   normal  . ESOPHAGOGASTRODUODENOSCOPY N/A 01/21/2016   Procedure: ESOPHAGOGASTRODUODENOSCOPY (EGD);  Surgeon: Wilford Corner, MD;  Location: [facility] ENDOSCOPY;  Service: Endoscopy;  Laterality: N/A;  . IR FLUORO GUIDE CV LINE RIGHT   04/01/2017  . IR US GUIDE VASC ACCESS RIGHT  04/01/2017  . JOINT REPLACEMENT    . ORIF FEMUR FRACTURE Right 03/31/2017   Procedure: OPEN REDUCTION INTERNAL FIXATION (ORIF) DISTAL FEMUR FRACTURE;  Surgeon: Paralee Cancel, MD;  Location: [place];  Service: Orthopedics;  Laterality: Right;  . Right knee arthroscopic surgery    . TOTAL ABDOMINAL HYSTERECTOMY      OB History    No data available       Home Medications    Prior to Admission medications   Medication Sig Start Date End Date Taking? Authorizing Provider  allopurinol (ZYLOPRIM) 100 MG tablet Take 100 mg by mouth daily.    [provider]  aspirin EC 81 MG tablet Take 81 mg by mouth daily.    [provider]  atorvastatin (LIPITOR) 80 MG tablet Take 80 mg by mouth daily.    [provider]  blood glucose meter kit and supplies KIT Dispense based on patient and insurance preference. Use up to four times daily as directed. (FOR ICD-10 E11.65) 01/04/17   Cassandria Anger, MD  Blood Glucose Monitoring Suppl (ONETOUCH VERIO) w/Device KIT 1 each by Does not apply route as needed. Test 4 times daily 11/30/16   Cassandria Anger, MD  Cholecalciferol (VITAMIN D) 2000 units CAPS Take 2,000 Units by mouth daily.     [provider]  cholestyramine light (PREVALITE) 4 g packet Take 1 packet (4 g total) by mouth daily with supper. 07/09/17   Gatha Mayer, MD  citalopram (CELEXA) 40 MG tablet Take 0.5 tablets (20 mg total) by mouth daily. 04/03/17   Rosita Fire, MD  cyanocobalamin (,VITAMIN B-12,) 1000 MCG/ML injection Inject 1,000 mcg into the muscle every 30 (thirty) days.      [provider]  diazepam (VALIUM) 5 MG tablet Take 1 tablet (5 mg total) by mouth 2 (two) times daily as needed for anxiety. 04/03/17   Rosita Fire, MD  esomeprazole (NEXIUM) 40 MG capsule Take 40 mg by mouth daily before breakfast.      [provider]  fenofibrate (TRICOR) 145 MG tablet Take 1  tablet (145 mg total) by mouth daily. 03/08/17   Gatha Mayer, MD  furosemide (LASIX) 40 MG tablet Take 1 tablet (40 mg total) by mouth daily. 01/26/16   Reyne Dumas, MD  glucose blood (ONE TOUCH TEST STRIPS) test strip Use as instructed 4 x daily. E11.65 11/30/16   Cassandria Anger, MD  Insulin Detemir (LEVEMIR FLEXTOUCH) 100 UNIT/ML Pen Inject 80 Units into the skin at bedtime. 04/03/17   Rosita Fire, MD  Insulin Lispro (HUMALOG KWIKPEN [place]) Inject into the skin 3 (three) times daily with meals. Sliding scale    [provider]  liraglutide (VICTOZA) 18 MG/3ML SOPN USE 1.8 MG EVERY DAY AS DIRECTED 05/30/17   Cassandria Anger, MD  metoprolol (LOPRESSOR) 100 MG tablet Take 100 mg by mouth 2 (two) times daily.     [provider]  ondansetron (ZOFRAN) 4 MG tablet 1-2 every 6  hrs as needed for nausea, cramps, diarrhea 06/26/17   Gatha Mayer, MD  potassium chloride SA (K-DUR,KLOR-CON) 20 MEQ tablet Take 20 mEq by mouth daily.    [provider]  tiZANidine (ZANAFLEX) 4 MG tablet Take 8 mg by mouth at bedtime as needed for muscle spasms.    [provider]    Family History Family History  Problem Relation Age of Onset  . Diabetes Mother   . Colon cancer Neg Hx     Social History Social History   Tobacco Use  . Smoking status: Never Smoker  . Smokeless tobacco: Never Used  Substance Use Topics  . Alcohol use: No  . Drug use: No     Allergies   Duloxetine hcl; Fluocinolone; Acetaminophen; Amlodipine besylate; Ciprofloxacin; Hydrocodone-acetaminophen; Metformin; Nsaids; Quinolones; Sulfamethoxazole; Sulfonamide derivatives; Valsartan; Cozaar [losartan]; and Lisinopril   Review of Systems Review of Systems  Constitutional:       Per HPI, otherwise negative  HENT:       Per HPI, otherwise negative  Respiratory:       Per HPI, otherwise negative  Cardiovascular:       Per HPI, otherwise negative  Gastrointestinal: Positive for  nausea and vomiting.  Endocrine:       Negative aside from HPI  Genitourinary:       Neg aside from HPI   Musculoskeletal:       Per HPI, otherwise negative  Skin: Negative.   Neurological: Positive for weakness. Negative for syncope.     Physical Exam Updated Vital Signs BP (!) 148/64   Pulse 87   Temp 98.3 F (36.8 C) (Oral)   Resp 20   Ht '5\' 3"'  (1.6 m)   Wt 71.7 kg (158 lb)   SpO2 95%   BMI 27.99 kg/m   Physical Exam  Constitutional: She is oriented to person, place, and time. She appears well-developed and well-nourished. No distress.  HENT:  Head: Normocephalic and atraumatic.  Eyes: Conjunctivae and EOM are normal.  Cardiovascular: Normal rate and regular rhythm.  Pulmonary/Chest: Effort normal and breath sounds normal. No stridor. No respiratory distress.  Abdominal: She exhibits no distension and no mass. There is no tenderness. There is no guarding.  Musculoskeletal: She exhibits no edema.       Arms: Neurological: She is alert and oriented to person, place, and time. No cranial nerve deficit.  Skin: Skin is warm and dry.  Psychiatric: She has a normal mood and affect.  Nursing note and vitals reviewed.    ED Treatments / Results  Labs (all labs ordered are listed, but only abnormal results are displayed) Labs Reviewed  URINALYSIS, ROUTINE W REFLEX MICROSCOPIC - Abnormal; Notable for the following components:      Result Value   Glucose, UA 50 (*)    Leukocytes, UA MODERATE (*)    Bacteria, UA RARE (*)    Squamous Epithelial / LPF 0-5 (*)    All other components within normal limits  CBC - Abnormal; Notable for the following components:   Hemoglobin 11.7 (*)    All other components within normal limits  COMPREHENSIVE METABOLIC PANEL - Abnormal; Notable for the following components:   Potassium 3.1 (*)    Chloride 100 (*)    Glucose, Bld 305 (*)    BUN 28 (*)    Creatinine, Ser 2.06 (*)    GFR calc non Af Amer 24 (*)    GFR calc Af Amer 28 (*)  Anion gap 17 (*)    All other components within normal limits     Procedures Procedures (including critical care time)  Medications Ordered in ED Medications  gabapentin (NEURONTIN) capsule 100 mg (not administered)  sodium chloride 0.9 % bolus 1,000 mL (1,000 mLs Intravenous New Bag/Given 01/03/18 1615)  ondansetron (ZOFRAN) injection 4 mg (4 mg Intravenous Given 01/03/18 1618)  fentaNYL (SUBLIMAZE) injection 25 mcg (25 mcg Intravenous Given 01/03/18 1618)     Initial Impression / Assessment and Plan / ED Course  I have reviewed the triage vital signs and the nursing notes.  Pertinent labs & imaging results that were available during my care of the patient were reviewed by me and considered in my medical decision making (see chart for details).     5:54 PM And we will repeat exam the patient is awake and alert, receiving IV fluids, in no distress. Nursing notes that the patient required almost immediately after receiving hernia initial fentanyl when she would receive additional medication, stating that fentanyl did not work for her.  Now, the patient appears calm, requests additional medication. I discussed all findings with her including demonstration of persistent kidney disease, mild hyper glycemia without anion gap, no evidence for distress, low suspicion for DKA. Patient also has no ketonuria, and is only spilling minimal amounts of glucose in her urine.  Given the physical exam findings, there is suspicion for radiculopathy, and the patient has a history of cervical radiculopathy. With no distal neurovascular compromise, no fever, no evidence for decompensated state, the patient was encouraged to follow-up with primary care, and started on gabapentin with outpatient orthopedics follow-up.  Final Clinical Impressions(s) / ED Diagnoses  Flank pain, left, initial encounter Nausea and vomiting   Carmin Muskrat, MD 01/03/18 1755

## 2018-01-03 NOTE — Discharge Instructions (Signed)
°  As discussed, today's evaluation has been generally reassuring. Your flank pain is likely due to radiculopathy or irritation of the nerves as they exit your spinal canal in the lower back.  Please take all medication as directed and be sure to follow-up with her orthopedist who previously treated you for your neck pain.  Take all medication as directed, including your diabetes medication and be sure to follow-up with your primary care physician in addition to the orthopedist.  Return here for concerning changes in your condition.

## 2018-01-03 NOTE — ED Triage Notes (Addendum)
Pt hospitalized  2 weeks ago for flu at [facility] and was told sugar was high. Pt reports that once discharged from hospital she has had pain left flank area that radiates into hip and lower abd. Pt reports that she was seen by PCP and dx with UTI. Pt was told to come to ED if pain worsened. Also reports feeling weak

## 2018-01-13 ENCOUNTER — Other Ambulatory Visit: Payer: Self-pay | Admitting: "Endocrinology

## 2018-01-13 DIAGNOSIS — E1165 Type 2 diabetes mellitus with hyperglycemia: Secondary | ICD-10-CM

## 2018-01-31 ENCOUNTER — Ambulatory Visit: Payer: Medicaid Other | Admitting: "Endocrinology

## 2018-02-17 ENCOUNTER — Ambulatory Visit (INDEPENDENT_AMBULATORY_CARE_PROVIDER_SITE_OTHER): Payer: Medicare Other | Admitting: Cardiology

## 2018-02-17 ENCOUNTER — Encounter: Payer: Self-pay | Admitting: *Deleted

## 2018-02-17 ENCOUNTER — Encounter: Payer: Self-pay | Admitting: Cardiology

## 2018-02-17 VITALS — BP 130/78 | HR 94 | Ht 63.0 in | Wt 154.0 lb

## 2018-02-17 DIAGNOSIS — I10 Essential (primary) hypertension: Secondary | ICD-10-CM | POA: Diagnosis not present

## 2018-02-17 DIAGNOSIS — R002 Palpitations: Secondary | ICD-10-CM | POA: Diagnosis not present

## 2018-02-17 DIAGNOSIS — E782 Mixed hyperlipidemia: Secondary | ICD-10-CM | POA: Diagnosis not present

## 2018-02-17 DIAGNOSIS — R0789 Other chest pain: Secondary | ICD-10-CM

## 2018-02-17 NOTE — Progress Notes (Signed)
Clinical Summary Heather [name] is a 67 y.o.female seen today for follow up of the following medical problems.   1. Chronic chest pain - normal cath 2003, 2005, 2012  Negative nuclear stress test in 2010 and 05/2013 - 11/2016 echo normal LVEF, no WMAs   -symptoms unchanged since last visit, atypical chronic chesat pain. . No SOB or DOE  2. Palpitations - can have some fluttering feelings at times. Occurs few times a week. Lasts just a few seconds. - new onset started about 1 year ago. Can occur at rest or with exertion - limited caffeine, no EtOH  07/2017 event monitor no arrhythmias - mild symptoms at times    3. HTN - home bp's 120s/60s  4. Hyperlipidemia - on cholystermine for diarrhea by her GI doctor, no for cholesterol - 06/2017 TG 393 TC 175 HDL 46 LDL 51  4. DM2 - 03/2017 HgbA1c 7.6 - allergies to ACE-I/ARB  5. CKD - Cr 1.64 last check  - upcoming appt with nephrologist    6. IBS/Chronic N/V - followed by GI  7. Syncope - worked up at [facility] cardiology. There notes mention a nagetive cardiac monitor - no recurrent episodes.   8. Cervical radiculopathy - followed by ortho     Past Medical History:  Diagnosis Date  . Acute renal failure superimposed on stage 3 chronic kidney disease ([place]) 01/21/2016  . B12 deficiency   . Cellulitis and abscess    Abdomen and buttocks  . CKD (chronic kidney disease) stage 3, GFR 30-59 ml/min (HCC)   . Closed fracture of distal end of femur, unspecified fracture morphology, initial encounter ([place])   . Depression   . Diabetic neuropathy ([place])   . DJD (degenerative joint disease)    Right forminal stenosis C4-5  . Essential hypertension   . Femur fracture, right ([place]) 03/30/2017  . Folliculitis   . Gout   . Headache(784.0)   . Hiatal hernia   . History of bronchitis   . History of cardiac catheterization    No significant CAD 2012  . Insomnia   . Irritable bowel syndrome   . Mixed hyperlipidemia     . Osteoarthritis   . Palpitations   . Reflux esophagitis   . Tinnitus    Right  . Type 2 diabetes mellitus (HCC)      Allergies  Allergen Reactions  . Duloxetine Hcl Swelling  . Fluocinolone Swelling  . Acetaminophen Itching and Swelling    Pt states "some swelling"  . Amlodipine Besylate Swelling    Swelling of feet, fluid retention  . Ciprofloxacin Nausea And Vomiting and Swelling    Swelling of face, jaw, and lips  . Hydrocodone-Acetaminophen Itching  . Metformin Other (See Comments)    REACTION: GI UPSET  . Nsaids Other (See Comments)    "HAS BLEEDING ULCERS"  . Quinolones Swelling  . Sulfamethoxazole Swelling    Swelling of feet, legs  . Sulfonamide Derivatives Swelling  . Valsartan Swelling  . Cozaar [Losartan] Swelling and Rash  . Lisinopril Swelling and Rash    Oral swelling, and red streaks on arms/stomach     Current Outpatient Medications  Medication Sig Dispense Refill  . allopurinol (ZYLOPRIM) 100 MG tablet Take 100 mg by mouth daily.    Marland Kitchen aspirin EC 81 MG tablet Take 81 mg by mouth daily.    Marland Kitchen atorvastatin (LIPITOR) 80 MG tablet Take 80 mg by mouth daily.    . blood glucose meter kit and supplies  KIT Dispense based on patient and insurance preference. Use up to four times daily as directed. (FOR ICD-10 E11.65) 1 each 0  . Blood Glucose Monitoring Suppl (ONETOUCH VERIO) w/Device KIT 1 each by Does not apply route as needed. Test 4 times daily 1 kit 0  . Cholecalciferol (VITAMIN D) 2000 units CAPS Take 2,000 Units by mouth daily.     . cholestyramine light (PREVALITE) 4 g packet Take 1 packet (4 g total) by mouth daily with supper. 30 packet 3  . citalopram (CELEXA) 40 MG tablet Take 0.5 tablets (20 mg total) by mouth daily.    . cyanocobalamin (,VITAMIN B-12,) 1000 MCG/ML injection Inject 1,000 mcg into the muscle every 30 (thirty) days.      . diazepam (VALIUM) 5 MG tablet Take 1 tablet (5 mg total) by mouth 2 (two) times daily as needed for anxiety. 6  tablet 0  . esomeprazole (NEXIUM) 40 MG capsule Take 40 mg by mouth daily before breakfast.      . fenofibrate (TRICOR) 145 MG tablet Take 1 tablet (145 mg total) by mouth daily. 90 tablet 3  . furosemide (LASIX) 40 MG tablet Take 1 tablet (40 mg total) by mouth daily. 30 tablet 0  . gabapentin (NEURONTIN) 100 MG capsule Take 1 capsule (100 mg total) by mouth 3 (three) times daily. 90 capsule 0  . glucose blood (ONE TOUCH TEST STRIPS) test strip Use as instructed 4 x daily. E11.65 150 each 3  . Insulin Detemir (LEVEMIR FLEXTOUCH) 100 UNIT/ML Pen Inject 80 Units into the skin at bedtime.    . Insulin Lispro (HUMALOG KWIKPEN [place]) Inject into the skin 3 (three) times daily with meals. Sliding scale    . liraglutide (VICTOZA) 18 MG/3ML SOPN USE 1.8 MG EVERY DAY AS DIRECTED 27 mL 0  . metoprolol (LOPRESSOR) 100 MG tablet Take 100 mg by mouth 2 (two) times daily.     . ondansetron (ZOFRAN) 4 MG tablet 1-2 every 6 hrs as needed for nausea, cramps, diarrhea 30 tablet 1  . potassium chloride SA (K-DUR,KLOR-CON) 20 MEQ tablet Take 20 mEq by mouth daily.    Marland Kitchen tiZANidine (ZANAFLEX) 4 MG tablet Take 8 mg by mouth at bedtime as needed for muscle spasms.     No current facility-administered medications for this visit.      Past Surgical History:  Procedure Laterality Date  . c-spine surgery     03/2007  . CARDIAC CATHETERIZATION    . CHOLECYSTECTOMY    . COLONOSCOPY  04/21/2011; 05/29/11   6/12: [place] - ?AVM at IC valve, inflammatory changes at [facility] valve [facility]); 7/12 - gessner; IC valve erosions, look chronic and probably Crohn's per path  . ESOPHAGOGASTRODUODENOSCOPY  05/29/11   normal  . ESOPHAGOGASTRODUODENOSCOPY N/A 01/21/2016   Procedure: ESOPHAGOGASTRODUODENOSCOPY (EGD);  Surgeon: Wilford Corner, MD;  [facility] ENDOSCOPY;  Service: Endoscopy;  Laterality: N/A;  . IR FLUORO GUIDE CV LINE RIGHT  04/01/2017  . IR US GUIDE VASC ACCESS RIGHT  04/01/2017  . JOINT REPLACEMENT    . ORIF  FEMUR FRACTURE Right 03/31/2017   Procedure: OPEN REDUCTION INTERNAL FIXATION (ORIF) DISTAL FEMUR FRACTURE;  Surgeon: Paralee Cancel, MD;  Location: [place];  Service: Orthopedics;  Laterality: Right;  . Right knee arthroscopic surgery    . TOTAL ABDOMINAL HYSTERECTOMY       Allergies  Allergen Reactions  . Duloxetine Hcl Swelling  . Fluocinolone Swelling  . Acetaminophen Itching and Swelling    Pt states "some  swelling"  . Amlodipine Besylate Swelling    Swelling of feet, fluid retention  . Ciprofloxacin Nausea And Vomiting and Swelling    Swelling of face, jaw, and lips  . Hydrocodone-Acetaminophen Itching  . Metformin Other (See Comments)    REACTION: GI UPSET  . Nsaids Other (See Comments)    "HAS BLEEDING ULCERS"  . Quinolones Swelling  . Sulfamethoxazole Swelling    Swelling of feet, legs  . Sulfonamide Derivatives Swelling  . Valsartan Swelling  . Cozaar [Losartan] Swelling and Rash  . Lisinopril Swelling and Rash    Oral swelling, and red streaks on arms/stomach      Family History  Problem Relation Age of Onset  . Diabetes Mother   . Colon cancer Neg Hx      Social History Ms. Rodger reports that she has never smoked. She has never used smokeless tobacco. Ms. Latella reports that she does not drink alcohol.   Review of Systems CONSTITUTIONAL: No weight loss, fever, chills, weakness or fatigue.  HEENT: Eyes: No visual loss, blurred vision, double vision or yellow sclerae.No hearing loss, sneezing, congestion, runny nose or sore throat.  SKIN: No rash or itching.  CARDIOVASCULAR: per hpi RESPIRATORY: No shortness of breath, cough or sputum.  GASTROINTESTINAL: No anorexia, nausea, vomiting or diarrhea. No abdominal pain or blood.  GENITOURINARY: No burning on urination, no polyuria NEUROLOGICAL: No headache, dizziness, syncope, paralysis, ataxia, numbness or tingling in the extremities. No change in bowel or bladder control.  MUSCULOSKELETAL: No muscle, back  pain, joint pain or stiffness.  LYMPHATICS: No enlarged nodes. No history of splenectomy.  PSYCHIATRIC: No history of depression or anxiety.  ENDOCRINOLOGIC: No reports of sweating, cold or heat intolerance. No polyuria or polydipsia.  Marland Kitchen   Physical Examination Vitals:   02/17/18 1453  BP: 130/78  Pulse: 94  SpO2: 98%   Vitals:   02/17/18 1453  Weight: 154 lb (69.9 kg)  Height: _0  (1.6 m)    Gen: resting comfortably, no acute distress HEENT: no scleral icterus, pupils equal round and reactive, no palptable cervical adenopathy,  CV: RRR, no m/r/g, no jvd Resp: Clear to auscultation bilaterally GI: abdomen is soft, non-tender, non-distended, normal bowel sounds, no hepatosplenomegaly MSK: extremities are warm, no edema.  Skin: warm, no rash Neuro:  no focal deficits Psych: appropriate affect   Diagnostic Studies Cath 2005: normal coronaries  05/2013 nuclear stress: no ischemia  04/2017 echo Study Conclusions  - Left ventricle: The cavity size was normal. There was mild concentric hypertrophy. Systolic function was normal. The estimated ejection fraction was in the range of 55% to 60%. Wall motion was normal; there were no regional wall motion abnormalities. Doppler parameters are consistent with abnormal left ventricular relaxation (grade 1 diastolic dysfunction). Borderline evidence of increased filling pressures. - Aortic valve: Trileaflet; mildly thickened leaflets. No significant regurgitation. Mean gradient: 4m Hg (S). Valve area: 2.38cm^2 (Vmax). - Mitral valve: Mild regurgitation. Valve area by continuity equation (using LVOT flow): 4.83cm^2. - Left atrium: The atrium was mildly dilated. - Right atrium: Central venous pressure: 336mHg (est). - Tricuspid valve: Mild regurgitation. - Pulmonary arteries: PA peak pressure: 3247mg (S). - Pericardium, extracardiac: There was no pericardial effusion. Impressions:  - Unable to compare  with previous study from December 2012. There is mild LVH with LVEF 55-67-67%rade 1 diastolic dysfunction and borderline evidence of increased filling pressures. Mild left atrial enlargement. Mild mitral regurgitation. Mild tricuspid regurgitation with PASP 32 mm mercury and normal CVP.  Transthoracic echocardiography. M-mode, complete 2D, spectral Doppler, and color Doppler. Height: Height: 157.5cm. Height: 62in. Weight: Weight: 77.1kg. Weight: 169.6lb. Body mass index: BMI: 31.1kg/m^2. Body surface area:  BSA: 1.50m2. Blood pressure:   150/85. Patient status: Outpatient.   11/2016 echo Study Conclusions  - Left ventricle: The cavity size was normal. Wall thickness was increased in a pattern of mild LVH. Systolic function was vigorous. The estimated ejection fraction was in the range of 65% to 70%. Wall motion was normal; there were no regional wall motion abnormalities. The study is not technically sufficient to allow evaluation of LV diastolic function. - Aortic valve: Mildly calcified annulus. Trileaflet; mildly calcified leaflets. - Mitral valve: Calcified annulus. There was trivial regurgitation. - Right atrium: Central venous pressure (est): 3 mm Hg. - Atrial septum: No defect or patent foramen ovale was identified. - Tricuspid valve: There was trivial regurgitation. - Pulmonary arteries: Systolic pressure could not be accurately estimated. - Pericardium, extracardiac: There was no pericardial effusion.  Impressions:  - Mild LVH with LVEF 65-70%, no regional wall motion abnormalities. Indeterminate diastolic function. Mildly calcified mitral annulus with trivial mitral regurgitation. Mildly calcified aortic valve. Trivial tricuspid regurgitation.  11/2010 cath HEMODYNAMIC DATA: 1. Right atrial pressure 9-11. 2. RV 32/9-12. 3. Pulmonary artery 32/18, mean 24. 4. Pulmonary capillary wedge 17. 5. Aortic 111/60, mean  81. 6. LV 102/17. 7. No gradient pullback across aortic valve. 8. No mitral valve gradient and simultaneous pressure measurements. 9. Superior vena cava saturation 52%. 10.Pulmonary artery saturation 52%. 11.Aortic saturation 92%. 12.Fick cardiac output 4.7 liters per minute. 13.Fick cardiac index 2.6 liters per minute per meter squared. 14.Thermodilution cardiac output 3.9 liters per minute. 15.Thermodilution cardiac index 2.1 liters per minute per meter squared.  ANGIOGRAPHIC DATA: 1. The left main coronary artery is free of critical disease. 2. The LAD courses towards the apex. There are twin vessels down over the anteroseptal, anterolateral segment. One is the diagonal, the other is likely the LAD with multiple septal perforators. This vessel was smooth throughout without significant focal narrowing. 3. The circumflex provides a large marginal Terek Bee and a smaller AV circumflex, both of which appear free of critical disease. 4. The right coronary artery is moderately large vessel providing posterior descending and posterolateral Ruqayyah Lute. There may be some minor plaquing at the bifurcation of the PLA, PDA, but no significant areas of high-grade focal stenosis noted.  CONCLUSION: 1. Borderline elevation of right heart pressures without significant pulmonary hypertension. 2. No evidence of either aortic or mitral valve gradient. 3. Somewhat low saturations in the superior vena cava and pulmonary artery rechecked during the procedure. 4. No significant coronary obstruction.   07/2017 event monitor  Telemetry tracings show normal sinus rhythm and sinus tachycardia  No signficant arrhythmias.  No symptoms reported  Assessment and Plan  1. Chest pain - long history, negative ischemic workups in the past - chronic ongoing symptoms unchanged, continue to monitor at this time.   2. Palpitations - recent benign monitor - mild  symptoms recently, we will continue to monitor.  3. HTN - bp at goal, continue current meds  4. Hyperlipidemia - LDL is at goal. TGs remain elevated, likely due to her DM2 not being at goal. Continue to follow. Has been started on TG lowering meds by GI, partly to help with her chronic GI symptoms  - continue current meds.    F/u 6 months         JArnoldo Lenis M.D.

## 2018-02-17 NOTE — Patient Instructions (Signed)

## 2018-02-19 ENCOUNTER — Encounter: Payer: Self-pay | Admitting: *Deleted

## 2018-02-20 ENCOUNTER — Telehealth: Payer: Self-pay | Admitting: Internal Medicine

## 2018-02-20 ENCOUNTER — Encounter: Payer: Self-pay | Admitting: Cardiology

## 2018-02-20 DIAGNOSIS — R197 Diarrhea, unspecified: Secondary | ICD-10-CM

## 2018-02-20 NOTE — Telephone Encounter (Signed)
Pt experiencing abd pain and diarrhea. She wants to know if Dr. Carlean Purl can prescribe something different than the previous medication, she stated that that medication did not help.

## 2018-02-20 NOTE — Telephone Encounter (Signed)
Patient reports that she is having diarrhea after meals.  No recent antibiotics, sick contacts, or travel. IBGard is no longer helping and the cholestyramine made her sick.  She is asking for something else for her IBS diarrhea.  Please advise.  She is aware you are out of the office and it will be bext week

## 2018-02-21 ENCOUNTER — Ambulatory Visit: Payer: Medicaid Other | Admitting: "Endocrinology

## 2018-02-23 NOTE — Telephone Encounter (Signed)
Chart reviewed  Not seen since 05/2017 and now DM out of control and was in hospital [facility]) 12/2017.  Doubt C diff but possible since hospitalized with influenza in March.  Also could be pancreatic insufficiency  She needs work-up   1) Stool - C diff PCR and a fecal elastase 2) Need med reconciliation to check accuracy of what she is actually on and taking now (Reglan can cause diarrhea but has been on in past) 3) I think poor blood sugar control may be cause 4) Schedule Next avail OV also while we are sorting through over the phone

## 2018-02-24 NOTE — Telephone Encounter (Signed)
Medication list is now current She will come for stool studies OV scheduled for 04/28/18 at 4:00 (next available)

## 2018-03-03 ENCOUNTER — Inpatient Hospital Stay (HOSPITAL_COMMUNITY)
Admission: EM | Admit: 2018-03-03 | Discharge: 2018-03-13 | DRG: 682 | Disposition: A | Discharge status: Home / Self Care | Payer: Medicare Other | Attending: Internal Medicine | Admitting: Internal Medicine

## 2018-03-03 ENCOUNTER — Other Ambulatory Visit: Payer: Self-pay

## 2018-03-03 ENCOUNTER — Encounter (HOSPITAL_COMMUNITY): Payer: Self-pay | Admitting: *Deleted

## 2018-03-03 DIAGNOSIS — A020 Salmonella enteritis: Secondary | ICD-10-CM | POA: Diagnosis present

## 2018-03-03 DIAGNOSIS — K5000 Crohn's disease of small intestine without complications: Secondary | ICD-10-CM

## 2018-03-03 DIAGNOSIS — N179 Acute kidney failure, unspecified: Principal | ICD-10-CM | POA: Diagnosis present

## 2018-03-03 DIAGNOSIS — M109 Gout, unspecified: Secondary | ICD-10-CM | POA: Diagnosis present

## 2018-03-03 DIAGNOSIS — R609 Edema, unspecified: Secondary | ICD-10-CM

## 2018-03-03 DIAGNOSIS — M1990 Unspecified osteoarthritis, unspecified site: Secondary | ICD-10-CM | POA: Diagnosis present

## 2018-03-03 DIAGNOSIS — K50018 Crohn's disease of small intestine with other complication: Secondary | ICD-10-CM

## 2018-03-03 DIAGNOSIS — K580 Irritable bowel syndrome with diarrhea: Secondary | ICD-10-CM | POA: Diagnosis present

## 2018-03-03 DIAGNOSIS — Z9049 Acquired absence of other specified parts of digestive tract: Secondary | ICD-10-CM

## 2018-03-03 DIAGNOSIS — Z90710 Acquired absence of both cervix and uterus: Secondary | ICD-10-CM

## 2018-03-03 DIAGNOSIS — Z833 Family history of diabetes mellitus: Secondary | ICD-10-CM

## 2018-03-03 DIAGNOSIS — E876 Hypokalemia: Secondary | ICD-10-CM | POA: Diagnosis present

## 2018-03-03 DIAGNOSIS — Z888 Allergy status to other drugs, medicaments and biological substances status: Secondary | ICD-10-CM

## 2018-03-03 DIAGNOSIS — E872 Acidosis, unspecified: Secondary | ICD-10-CM

## 2018-03-03 DIAGNOSIS — K859 Acute pancreatitis without necrosis or infection, unspecified: Secondary | ICD-10-CM | POA: Diagnosis present

## 2018-03-03 DIAGNOSIS — Z794 Long term (current) use of insulin: Secondary | ICD-10-CM

## 2018-03-03 DIAGNOSIS — E1165 Type 2 diabetes mellitus with hyperglycemia: Secondary | ICD-10-CM | POA: Diagnosis not present

## 2018-03-03 DIAGNOSIS — K210 Gastro-esophageal reflux disease with esophagitis: Secondary | ICD-10-CM | POA: Diagnosis present

## 2018-03-03 DIAGNOSIS — K449 Diaphragmatic hernia without obstruction or gangrene: Secondary | ICD-10-CM | POA: Diagnosis present

## 2018-03-03 DIAGNOSIS — Z7982 Long term (current) use of aspirin: Secondary | ICD-10-CM

## 2018-03-03 DIAGNOSIS — Z882 Allergy status to sulfonamides status: Secondary | ICD-10-CM

## 2018-03-03 DIAGNOSIS — Z66 Do not resuscitate: Secondary | ICD-10-CM | POA: Diagnosis present

## 2018-03-03 DIAGNOSIS — Z881 Allergy status to other antibiotic agents status: Secondary | ICD-10-CM

## 2018-03-03 DIAGNOSIS — N183 Chronic kidney disease, stage 3 (moderate): Secondary | ICD-10-CM | POA: Diagnosis present

## 2018-03-03 DIAGNOSIS — A029 Salmonella infection, unspecified: Secondary | ICD-10-CM

## 2018-03-03 DIAGNOSIS — K219 Gastro-esophageal reflux disease without esophagitis: Secondary | ICD-10-CM | POA: Diagnosis present

## 2018-03-03 DIAGNOSIS — D631 Anemia in chronic kidney disease: Secondary | ICD-10-CM | POA: Diagnosis present

## 2018-03-03 DIAGNOSIS — R748 Abnormal levels of other serum enzymes: Secondary | ICD-10-CM

## 2018-03-03 DIAGNOSIS — E871 Hypo-osmolality and hyponatremia: Secondary | ICD-10-CM | POA: Diagnosis not present

## 2018-03-03 DIAGNOSIS — K529 Noninfective gastroenteritis and colitis, unspecified: Secondary | ICD-10-CM | POA: Diagnosis not present

## 2018-03-03 DIAGNOSIS — Z6825 Body mass index (BMI) 25.0-25.9, adult: Secondary | ICD-10-CM

## 2018-03-03 DIAGNOSIS — T380X5A Adverse effect of glucocorticoids and synthetic analogues, initial encounter: Secondary | ICD-10-CM | POA: Diagnosis not present

## 2018-03-03 DIAGNOSIS — Z9660 Presence of unspecified orthopedic joint implant: Secondary | ICD-10-CM | POA: Diagnosis present

## 2018-03-03 DIAGNOSIS — K760 Fatty (change of) liver, not elsewhere classified: Secondary | ICD-10-CM | POA: Diagnosis present

## 2018-03-03 DIAGNOSIS — E860 Dehydration: Secondary | ICD-10-CM | POA: Diagnosis present

## 2018-03-03 DIAGNOSIS — E43 Unspecified severe protein-calorie malnutrition: Secondary | ICD-10-CM | POA: Diagnosis present

## 2018-03-03 DIAGNOSIS — B373 Candidiasis of vulva and vagina: Secondary | ICD-10-CM | POA: Diagnosis present

## 2018-03-03 DIAGNOSIS — E1122 Type 2 diabetes mellitus with diabetic chronic kidney disease: Secondary | ICD-10-CM | POA: Diagnosis present

## 2018-03-03 DIAGNOSIS — I129 Hypertensive chronic kidney disease with stage 1 through stage 4 chronic kidney disease, or unspecified chronic kidney disease: Secondary | ICD-10-CM | POA: Diagnosis present

## 2018-03-03 DIAGNOSIS — F329 Major depressive disorder, single episode, unspecified: Secondary | ICD-10-CM | POA: Diagnosis present

## 2018-03-03 DIAGNOSIS — I071 Rheumatic tricuspid insufficiency: Secondary | ICD-10-CM | POA: Diagnosis present

## 2018-03-03 DIAGNOSIS — Z886 Allergy status to analgesic agent status: Secondary | ICD-10-CM

## 2018-03-03 DIAGNOSIS — N3289 Other specified disorders of bladder: Secondary | ICD-10-CM | POA: Diagnosis present

## 2018-03-03 DIAGNOSIS — E1140 Type 2 diabetes mellitus with diabetic neuropathy, unspecified: Secondary | ICD-10-CM | POA: Diagnosis present

## 2018-03-03 DIAGNOSIS — E782 Mixed hyperlipidemia: Secondary | ICD-10-CM | POA: Diagnosis present

## 2018-03-03 NOTE — ED Triage Notes (Signed)
Pt c/ n/v/d, chills, abd pain since Thursday,

## 2018-03-04 ENCOUNTER — Emergency Department (HOSPITAL_COMMUNITY): Payer: Medicare Other

## 2018-03-04 ENCOUNTER — Inpatient Hospital Stay (HOSPITAL_COMMUNITY): Payer: Medicare Other

## 2018-03-04 DIAGNOSIS — Z7982 Long term (current) use of aspirin: Secondary | ICD-10-CM | POA: Diagnosis not present

## 2018-03-04 DIAGNOSIS — K529 Noninfective gastroenteritis and colitis, unspecified: Secondary | ICD-10-CM | POA: Diagnosis present

## 2018-03-04 DIAGNOSIS — E872 Acidosis: Secondary | ICD-10-CM | POA: Diagnosis present

## 2018-03-04 DIAGNOSIS — E1165 Type 2 diabetes mellitus with hyperglycemia: Secondary | ICD-10-CM | POA: Diagnosis not present

## 2018-03-04 DIAGNOSIS — A020 Salmonella enteritis: Secondary | ICD-10-CM | POA: Diagnosis present

## 2018-03-04 DIAGNOSIS — I129 Hypertensive chronic kidney disease with stage 1 through stage 4 chronic kidney disease, or unspecified chronic kidney disease: Secondary | ICD-10-CM | POA: Diagnosis present

## 2018-03-04 DIAGNOSIS — K8500 Idiopathic acute pancreatitis without necrosis or infection: Secondary | ICD-10-CM

## 2018-03-04 DIAGNOSIS — K210 Gastro-esophageal reflux disease with esophagitis: Secondary | ICD-10-CM | POA: Diagnosis present

## 2018-03-04 DIAGNOSIS — E1140 Type 2 diabetes mellitus with diabetic neuropathy, unspecified: Secondary | ICD-10-CM | POA: Diagnosis present

## 2018-03-04 DIAGNOSIS — E871 Hypo-osmolality and hyponatremia: Secondary | ICD-10-CM | POA: Diagnosis not present

## 2018-03-04 DIAGNOSIS — N179 Acute kidney failure, unspecified: Principal | ICD-10-CM

## 2018-03-04 DIAGNOSIS — A029 Salmonella infection, unspecified: Secondary | ICD-10-CM | POA: Diagnosis not present

## 2018-03-04 DIAGNOSIS — N183 Chronic kidney disease, stage 3 (moderate): Secondary | ICD-10-CM | POA: Diagnosis present

## 2018-03-04 DIAGNOSIS — K449 Diaphragmatic hernia without obstruction or gangrene: Secondary | ICD-10-CM | POA: Diagnosis present

## 2018-03-04 DIAGNOSIS — Z833 Family history of diabetes mellitus: Secondary | ICD-10-CM | POA: Diagnosis not present

## 2018-03-04 DIAGNOSIS — K580 Irritable bowel syndrome with diarrhea: Secondary | ICD-10-CM | POA: Diagnosis present

## 2018-03-04 DIAGNOSIS — K859 Acute pancreatitis without necrosis or infection, unspecified: Secondary | ICD-10-CM | POA: Diagnosis present

## 2018-03-04 DIAGNOSIS — Z9049 Acquired absence of other specified parts of digestive tract: Secondary | ICD-10-CM | POA: Diagnosis not present

## 2018-03-04 DIAGNOSIS — R197 Diarrhea, unspecified: Secondary | ICD-10-CM | POA: Diagnosis not present

## 2018-03-04 DIAGNOSIS — E782 Mixed hyperlipidemia: Secondary | ICD-10-CM | POA: Diagnosis present

## 2018-03-04 DIAGNOSIS — E1122 Type 2 diabetes mellitus with diabetic chronic kidney disease: Secondary | ICD-10-CM | POA: Diagnosis present

## 2018-03-04 DIAGNOSIS — R748 Abnormal levels of other serum enzymes: Secondary | ICD-10-CM | POA: Diagnosis not present

## 2018-03-04 DIAGNOSIS — Z886 Allergy status to analgesic agent status: Secondary | ICD-10-CM | POA: Diagnosis not present

## 2018-03-04 DIAGNOSIS — K219 Gastro-esophageal reflux disease without esophagitis: Secondary | ICD-10-CM | POA: Diagnosis present

## 2018-03-04 DIAGNOSIS — M109 Gout, unspecified: Secondary | ICD-10-CM | POA: Diagnosis present

## 2018-03-04 DIAGNOSIS — M1990 Unspecified osteoarthritis, unspecified site: Secondary | ICD-10-CM | POA: Diagnosis present

## 2018-03-04 DIAGNOSIS — Z794 Long term (current) use of insulin: Secondary | ICD-10-CM | POA: Diagnosis not present

## 2018-03-04 DIAGNOSIS — I071 Rheumatic tricuspid insufficiency: Secondary | ICD-10-CM | POA: Diagnosis present

## 2018-03-04 DIAGNOSIS — Z90710 Acquired absence of both cervix and uterus: Secondary | ICD-10-CM | POA: Diagnosis not present

## 2018-03-04 DIAGNOSIS — E43 Unspecified severe protein-calorie malnutrition: Secondary | ICD-10-CM | POA: Diagnosis present

## 2018-03-04 LAB — URINALYSIS, ROUTINE W REFLEX MICROSCOPIC
Bilirubin Urine: NEGATIVE
Glucose, UA: NEGATIVE mg/dL
KETONES UR: NEGATIVE mg/dL
Nitrite: POSITIVE — AB
PROTEIN: NEGATIVE mg/dL
Specific Gravity, Urine: 1.014 (ref 1.005–1.030)
pH: 5 (ref 5.0–8.0)

## 2018-03-04 LAB — CBC
HCT: 40.8 % (ref 36.0–46.0)
HEMATOCRIT: 32.3 % — AB (ref 36.0–46.0)
HEMOGLOBIN: 11.5 g/dL — AB (ref 12.0–15.0)
Hemoglobin: 14.8 g/dL (ref 12.0–15.0)
MCH: 29.3 pg (ref 26.0–34.0)
MCH: 30.1 pg (ref 26.0–34.0)
MCHC: 35.6 g/dL (ref 30.0–36.0)
MCHC: 36.3 g/dL — AB (ref 30.0–36.0)
MCV: 82.4 fL (ref 78.0–100.0)
MCV: 82.9 fL (ref 78.0–100.0)
Platelets: 289 10*3/uL (ref 150–400)
Platelets: 367 10*3/uL (ref 150–400)
RBC: 3.92 MIL/uL (ref 3.87–5.11)
RBC: 4.92 MIL/uL (ref 3.87–5.11)
RDW: 13.6 % (ref 11.5–15.5)
RDW: 13.8 % (ref 11.5–15.5)
WBC: 10.9 10*3/uL — AB (ref 4.0–10.5)
WBC: 15.6 10*3/uL — ABNORMAL HIGH (ref 4.0–10.5)

## 2018-03-04 LAB — GLUCOSE, CAPILLARY
GLUCOSE-CAPILLARY: 287 mg/dL — AB (ref 65–99)
Glucose-Capillary: 229 mg/dL — ABNORMAL HIGH (ref 65–99)

## 2018-03-04 LAB — BLOOD GAS, VENOUS
Acid-base deficit: 14.7 mmol/L — ABNORMAL HIGH (ref 0.0–2.0)
BICARBONATE: 13.4 mmol/L — AB (ref 20.0–28.0)
O2 Saturation: 98.3 %
PCO2 VEN: 29.2 mmHg — AB (ref 44.0–60.0)
PO2 VEN: 203 mmHg — AB (ref 32.0–45.0)
pH, Ven: 7.22 — ABNORMAL LOW (ref 7.250–7.430)

## 2018-03-04 LAB — COMPREHENSIVE METABOLIC PANEL
ALK PHOS: 105 U/L (ref 38–126)
ALT: 16 U/L (ref 14–54)
AST: 16 U/L (ref 15–41)
Albumin: 4.3 g/dL (ref 3.5–5.0)
Anion gap: 26 — ABNORMAL HIGH (ref 5–15)
BUN: 96 mg/dL — AB (ref 6–20)
CALCIUM: 8.3 mg/dL — AB (ref 8.9–10.3)
CHLORIDE: 89 mmol/L — AB (ref 101–111)
CO2: 15 mmol/L — AB (ref 22–32)
CREATININE: 9.24 mg/dL — AB (ref 0.44–1.00)
GFR calc Af Amer: 5 mL/min — ABNORMAL LOW (ref >60)
GFR, EST NON AFRICAN AMERICAN: 4 mL/min — AB (ref >60)
Glucose, Bld: 255 mg/dL — ABNORMAL HIGH (ref 65–99)
Potassium: 2.4 mmol/L — CL (ref 3.5–5.1)
Sodium: 130 mmol/L — ABNORMAL LOW (ref 135–145)
Total Bilirubin: 0.8 mg/dL (ref 0.3–1.2)
Total Protein: 9.1 g/dL — ABNORMAL HIGH (ref 6.5–8.1)

## 2018-03-04 LAB — I-STAT CG4 LACTIC ACID, ED
LACTIC ACID, VENOUS: 1.21 mmol/L (ref 0.5–1.9)
LACTIC ACID, VENOUS: 1.64 mmol/L (ref 0.5–1.9)
Lactic Acid, Venous: 2.49 mmol/L (ref 0.5–1.9)

## 2018-03-04 LAB — MAGNESIUM: MAGNESIUM: 1.5 mg/dL — AB (ref 1.7–2.4)

## 2018-03-04 LAB — CREATININE, SERUM
Creatinine, Ser: 6.95 mg/dL — ABNORMAL HIGH (ref 0.44–1.00)
GFR, EST AFRICAN AMERICAN: 6 mL/min — AB (ref >60)
GFR, EST NON AFRICAN AMERICAN: 6 mL/min — AB (ref >60)

## 2018-03-04 LAB — LIPASE, BLOOD: Lipase: 1045 U/L — ABNORMAL HIGH (ref 11–51)

## 2018-03-04 LAB — SODIUM, URINE, RANDOM: SODIUM UR: 30 mmol/L

## 2018-03-04 LAB — CREATININE, URINE, RANDOM: CREATININE, URINE: 190.06 mg/dL

## 2018-03-04 MED ORDER — SODIUM BICARBONATE 8.4 % IV SOLN
INTRAVENOUS | Status: DC
  Administered 2018-03-04 (×2): via INTRAVENOUS
  Filled 2018-03-04 (×4): qty 150

## 2018-03-04 MED ORDER — ONDANSETRON HCL 4 MG/2ML IJ SOLN
4.0000 mg | Freq: Once | INTRAMUSCULAR | Status: AC
  Administered 2018-03-04: 4 mg via INTRAVENOUS
  Filled 2018-03-04: qty 2

## 2018-03-04 MED ORDER — SODIUM CHLORIDE 0.9 % IV BOLUS
1000.0000 mL | Freq: Once | INTRAVENOUS | Status: AC
  Administered 2018-03-04: 1000 mL via INTRAVENOUS

## 2018-03-04 MED ORDER — ASPIRIN EC 81 MG PO TBEC
81.0000 mg | DELAYED_RELEASE_TABLET | Freq: Every day | ORAL | Status: DC
  Administered 2018-03-04 – 2018-03-13 (×10): 81 mg via ORAL
  Filled 2018-03-04 (×10): qty 1

## 2018-03-04 MED ORDER — POTASSIUM CHLORIDE CRYS ER 20 MEQ PO TBCR
40.0000 meq | EXTENDED_RELEASE_TABLET | Freq: Once | ORAL | Status: DC
  Filled 2018-03-04: qty 2

## 2018-03-04 MED ORDER — LACTATED RINGERS IV BOLUS
1000.0000 mL | Freq: Once | INTRAVENOUS | Status: AC
  Administered 2018-03-04: 500 mL via INTRAVENOUS

## 2018-03-04 MED ORDER — FENTANYL CITRATE (PF) 100 MCG/2ML IJ SOLN
50.0000 ug | Freq: Once | INTRAMUSCULAR | Status: AC
Start: 2018-03-04 — End: 2018-03-04
  Administered 2018-03-04: 50 ug via INTRAVENOUS
  Filled 2018-03-04: qty 2

## 2018-03-04 MED ORDER — METOPROLOL TARTRATE 50 MG PO TABS
100.0000 mg | ORAL_TABLET | Freq: Two times a day (BID) | ORAL | Status: DC
  Administered 2018-03-04 – 2018-03-13 (×18): 100 mg via ORAL
  Filled 2018-03-04 (×19): qty 2

## 2018-03-04 MED ORDER — LACTATED RINGERS IV SOLN: INTRAVENOUS | Status: DC

## 2018-03-04 MED ORDER — SODIUM BICARBONATE 8.4 % IV SOLN
INTRAVENOUS | Status: AC
  Filled 2018-03-04: qty 150

## 2018-03-04 MED ORDER — MAGNESIUM SULFATE 2 GM/50ML IV SOLN
2.0000 g | Freq: Once | INTRAVENOUS | Status: AC
  Administered 2018-03-04: 2 g via INTRAVENOUS
  Filled 2018-03-04: qty 50

## 2018-03-04 MED ORDER — SODIUM CHLORIDE 0.9 % IV SOLN: INTRAVENOUS | Status: DC

## 2018-03-04 MED ORDER — ONDANSETRON HCL 4 MG/2ML IJ SOLN
4.0000 mg | Freq: Four times a day (QID) | INTRAMUSCULAR | Status: DC | PRN
  Administered 2018-03-04 – 2018-03-08 (×14): 4 mg via INTRAVENOUS
  Filled 2018-03-04 (×15): qty 2

## 2018-03-04 MED ORDER — SODIUM CHLORIDE 0.45 % IV SOLN
INTRAVENOUS | Status: DC
  Administered 2018-03-04 – 2018-03-08 (×11): via INTRAVENOUS
  Filled 2018-03-04 (×16): qty 1000

## 2018-03-04 MED ORDER — INSULIN ASPART 100 UNIT/ML ~~LOC~~ SOLN
0.0000 [IU] | Freq: Three times a day (TID) | SUBCUTANEOUS | Status: DC
  Administered 2018-03-04: 3 [IU] via SUBCUTANEOUS
  Administered 2018-03-04: 5 [IU] via SUBCUTANEOUS
  Administered 2018-03-05: 2 [IU] via SUBCUTANEOUS
  Administered 2018-03-05: 3 [IU] via SUBCUTANEOUS
  Administered 2018-03-05: 5 [IU] via SUBCUTANEOUS
  Administered 2018-03-06: 7 [IU] via SUBCUTANEOUS

## 2018-03-04 MED ORDER — ONDANSETRON HCL 4 MG PO TABS
4.0000 mg | ORAL_TABLET | Freq: Four times a day (QID) | ORAL | Status: DC | PRN
  Administered 2018-03-08: 4 mg via ORAL
  Filled 2018-03-04: qty 1

## 2018-03-04 MED ORDER — POTASSIUM CHLORIDE 10 MEQ/100ML IV SOLN
10.0000 meq | INTRAVENOUS | Status: AC
  Administered 2018-03-04 (×3): 10 meq via INTRAVENOUS
  Filled 2018-03-04 (×3): qty 100

## 2018-03-04 MED ORDER — MORPHINE SULFATE (PF) 2 MG/ML IV SOLN
2.0000 mg | INTRAVENOUS | Status: DC | PRN
  Administered 2018-03-04 – 2018-03-12 (×28): 2 mg via INTRAVENOUS
  Filled 2018-03-04 (×30): qty 1

## 2018-03-04 MED ORDER — SODIUM BICARBONATE 8.4 % IV SOLN
50.0000 meq | Freq: Once | INTRAVENOUS | Status: AC
  Administered 2018-03-04: 50 meq via INTRAVENOUS
  Filled 2018-03-04: qty 50

## 2018-03-04 MED ORDER — GABAPENTIN 100 MG PO CAPS
100.0000 mg | ORAL_CAPSULE | Freq: Three times a day (TID) | ORAL | Status: DC
  Administered 2018-03-04 – 2018-03-13 (×29): 100 mg via ORAL
  Filled 2018-03-04 (×29): qty 1

## 2018-03-04 MED ORDER — HEPARIN SODIUM (PORCINE) 5000 UNIT/ML IJ SOLN
5000.0000 [IU] | Freq: Three times a day (TID) | INTRAMUSCULAR | Status: DC
  Administered 2018-03-04 – 2018-03-13 (×26): 5000 [IU] via SUBCUTANEOUS
  Filled 2018-03-04 (×27): qty 1

## 2018-03-04 MED ORDER — DIAZEPAM 5 MG PO TABS
5.0000 mg | ORAL_TABLET | Freq: Two times a day (BID) | ORAL | Status: DC | PRN
  Administered 2018-03-04 – 2018-03-12 (×8): 5 mg via ORAL
  Filled 2018-03-04 (×9): qty 1

## 2018-03-04 MED ORDER — CITALOPRAM HYDROBROMIDE 20 MG PO TABS
20.0000 mg | ORAL_TABLET | Freq: Every day | ORAL | Status: DC
  Administered 2018-03-04 – 2018-03-13 (×10): 20 mg via ORAL
  Filled 2018-03-04 (×10): qty 1

## 2018-03-04 NOTE — ED Provider Notes (Signed)
[facility] EMERGENCY DEPARTMENT Provider Note   CSN: [id number] Arrival date & time: 03/03/18  2318     History   Chief Complaint Chief Complaint  Patient presents with  . Abdominal Pain    HPI Heather [name] is a 67 y.o. female.  Patient presents with a 5-day history of nausea, vomiting, diarrhea and abdominal pain.  States she is been vomiting about 5-6 times daily that is nonbilious and nonbloody.  She is also had stools that have been intermittently clear and dark.  She does not take blood thinners.  She states she has not been able to keep anything down for the past 4 days.  T-max 100.2.  No sick contacts.  No recent travel outside the country.  No antibiotic use.  Patient does have history of diabetes, reflux esophagitis, hypertension and CKD.  She describes abdominal pain is worse in her epigastrium and left lower quadrant.  Denies having any history of of abdominal problems in the past.  The history is provided by the patient.  Abdominal Pain   Associated symptoms include diarrhea, nausea and vomiting. Pertinent negatives include dysuria, hematuria, headaches, arthralgias and myalgias.    Past Medical History:  Diagnosis Date  . Acute renal failure superimposed on stage 3 chronic kidney disease ([place]) 01/21/2016  . B12 deficiency   . Cellulitis and abscess    Abdomen and buttocks  . CKD (chronic kidney disease) stage 3, GFR 30-59 ml/min (HCC)   . Closed fracture of distal end of femur, unspecified fracture morphology, initial encounter ([place])   . Depression   . Diabetic neuropathy ([place])   . DJD (degenerative joint disease)    Right forminal stenosis C4-5  . Essential hypertension   . Femur fracture, right ([place]) 03/30/2017  . Folliculitis   . Gout   . Headache(784.0)   . Hiatal hernia   . History of bronchitis   . History of cardiac catheterization    No significant CAD 2012  . Insomnia   . Irritable bowel syndrome   . Mixed hyperlipidemia   . Osteoarthritis     . Palpitations   . Reflux esophagitis   . Tinnitus    Right  . Type 2 diabetes mellitus [facility])     Patient Active Problem List   Diagnosis Date Noted  . Neuropathy 03/02/2015  . Pain in the chest   . Chest pain 12/22/2014  . Essential hypertension 12/22/2014  . Diabetes mellitus with chronic kidney disease ([place]) 12/22/2014  . Hiatal hernia 12/22/2014  . GERD (gastroesophageal reflux disease) 12/22/2014  . Gout 12/22/2014  . Mixed hyperlipidemia 12/22/2014  . Depression 12/22/2014  . Chronic kidney disease, stage III (moderate) ([place]) 08/25/2014  . Vitamin B12 deficiency 07/08/2011  . Personal history of failed moderate sedation 04/16/2011  . Chronic Nausea and Vomiting - ? gastroapresis 04/16/2011  . Early satiety 04/16/2011  . Irritable bowel syndrome 04/16/2011  . Benign paroxysmal positional vertigo 11/20/2010  . TRICUSPID REGURGITATION, MODERATE 11/20/2010  . PULMONARY HYPERTENSION, MILD 11/20/2010  . LEG EDEMA, BILATERAL 11/20/2010  . DYSPNEA 11/20/2010  . NONSPECIFIC ABNORMAL ELECTROCARDIOGRAM 11/20/2010  . CHEST WALL PAIN, HX OF 11/20/2010    Past Surgical History:  Procedure Laterality Date  . c-spine surgery     03/2007  . CARDIAC CATHETERIZATION    . CHOLECYSTECTOMY    . COLONOSCOPY  04/21/2011; 05/29/11   6/12: [place] - ?AVM at IC valve, inflammatory changes at [facility] valve [facility]); 7/12 - [facility]; IC valve erosions, look chronic  and probably Crohn's per path  . ESOPHAGOGASTRODUODENOSCOPY  05/29/11   normal  . ESOPHAGOGASTRODUODENOSCOPY N/A 01/21/2016   Procedure: ESOPHAGOGASTRODUODENOSCOPY (EGD);  Surgeon: Wilford Corner, MD;  Location: [facility] ENDOSCOPY;  Service: Endoscopy;  Laterality: N/A;  . IR FLUORO GUIDE CV LINE RIGHT  04/01/2017  . IR US GUIDE VASC ACCESS RIGHT  04/01/2017  . JOINT REPLACEMENT    . ORIF FEMUR FRACTURE Right 03/31/2017   Procedure: OPEN REDUCTION INTERNAL FIXATION (ORIF) DISTAL FEMUR FRACTURE;  Surgeon: Paralee Cancel, MD;  Location:  [facility];  Service: Orthopedics;  Laterality: Right;  . Right knee arthroscopic surgery    . TOTAL ABDOMINAL HYSTERECTOMY       OB History   None      Home Medications    Prior to Admission medications   Medication Sig Start Date End Date Taking? Authorizing Provider  allopurinol (ZYLOPRIM) 100 MG tablet Take 100 mg by mouth daily.    [provider]  aspirin EC 81 MG tablet Take 81 mg by mouth daily.    [provider]  atorvastatin (LIPITOR) 80 MG tablet Take 80 mg by mouth daily.    [provider]  blood glucose meter kit and supplies KIT Dispense based on patient and insurance preference. Use up to four times daily as directed. (FOR ICD-10 E11.65) 01/04/17   Cassandria Anger, MD  Blood Glucose Monitoring Suppl (ONETOUCH VERIO) w/Device KIT 1 each by Does not apply route as needed. Test 4 times daily 11/30/16   Cassandria Anger, MD  Cholecalciferol (VITAMIN D) 2000 units CAPS Take 2,000 Units by mouth daily.     [provider]  citalopram (CELEXA) 40 MG tablet Take 0.5 tablets (20 mg total) by mouth daily. 04/03/17   Rosita Fire, MD  cyanocobalamin (,VITAMIN B-12,) 1000 MCG/ML injection Inject 1,000 mcg into the muscle every 30 (thirty) days.      [provider]  diazepam (VALIUM) 5 MG tablet Take 1 tablet (5 mg total) by mouth 2 (two) times daily as needed for anxiety. 04/03/17   Rosita Fire, MD  esomeprazole (NEXIUM) 40 MG capsule Take 40 mg by mouth daily before breakfast.      [provider]  fenofibrate (TRICOR) 145 MG tablet Take 1 tablet (145 mg total) by mouth daily. 03/08/17   Gatha Mayer, MD  furosemide (LASIX) 40 MG tablet Take 1 tablet (40 mg total) by mouth daily. 01/26/16   Reyne Dumas, MD  gabapentin (NEURONTIN) 100 MG capsule Take 1 capsule (100 mg total) by mouth 3 (three) times daily. 01/03/18   Carmin Muskrat, MD  glucose blood (ONE TOUCH TEST STRIPS) test strip Use as instructed 4 x  daily. E11.65 11/30/16   Cassandria Anger, MD  Insulin Detemir (LEVEMIR FLEXTOUCH) 100 UNIT/ML Pen Inject 80 Units into the skin at bedtime. 04/03/17   Rosita Fire, MD  Insulin Lispro (HUMALOG KWIKPEN [place]) Inject into the skin 3 (three) times daily with meals. Sliding scale    [provider]  liraglutide (VICTOZA) 18 MG/3ML SOPN USE 1.8 MG EVERY DAY AS DIRECTED 05/30/17   Cassandria Anger, MD  metoprolol (LOPRESSOR) 100 MG tablet Take 100 mg by mouth 2 (two) times daily.     [provider]  ondansetron (ZOFRAN) 4 MG tablet 1-2 every 6 hrs as needed for nausea, cramps, diarrhea 06/26/17   Gatha Mayer, MD  potassium chloride SA (K-DUR,KLOR-CON) 20 MEQ tablet Take 20 mEq by mouth daily.  [provider]    Family History Family History  Problem Relation Age of Onset  . Diabetes Mother   . Colon cancer Neg Hx     Social History Social History   Tobacco Use  . Smoking status: Never Smoker  . Smokeless tobacco: Never Used  Substance Use Topics  . Alcohol use: No  . Drug use: No     Allergies   Duloxetine hcl; Fluocinolone; Acetaminophen; Amlodipine besylate; Ciprofloxacin; Hydrocodone-acetaminophen; Metformin; Nsaids; Quinolones; Sulfamethoxazole; Sulfonamide derivatives; Valsartan; Cozaar [losartan]; and Lisinopril   Review of Systems Review of Systems  Constitutional: Positive for activity change, appetite change and fatigue.  HENT: Negative for congestion, nosebleeds and postnasal drip.   Eyes: Negative for visual disturbance.  Respiratory: Negative for cough, chest tightness and shortness of breath.   Cardiovascular: Negative for chest pain.  Gastrointestinal: Positive for abdominal pain, diarrhea, nausea and vomiting.  Genitourinary: Negative for dysuria, hematuria, vaginal bleeding and vaginal discharge.  Musculoskeletal: Negative for arthralgias and myalgias.  Skin: Negative for rash.  Neurological: Negative for dizziness,  seizures and headaches.    all other systems are negative except as noted in the HPI and PMH.    Physical Exam Updated Vital Signs BP (!) 113/56   Pulse 71   Temp 97.9 F (36.6 C) (Oral)   Resp 20   Ht '5\' 3"'  (1.6 m)   Wt 69.9 kg (154 lb)   SpO2 96%   BMI 27.28 kg/m   Physical Exam  Constitutional: She is oriented to person, place, and time. She appears well-developed and well-nourished. No distress.  Dry mucus membranes  HENT:  Head: Normocephalic and atraumatic.  Mouth/Throat: Oropharynx is clear and moist. No oropharyngeal exudate.  Eyes: Pupils are equal, round, and reactive to light. Conjunctivae and EOM are normal.  Neck: Normal range of motion. Neck supple.  No meningismus.  Cardiovascular: Normal rate, regular rhythm, normal heart sounds and intact distal pulses.  No murmur heard. Pulmonary/Chest: Effort normal and breath sounds normal. No respiratory distress.  Abdominal: Soft. There is tenderness. There is no rebound and no guarding.  Epigastric and LLQ tenderness  Musculoskeletal: Normal range of motion. She exhibits no edema or tenderness.  Neurological: She is alert and oriented to person, place, and time. No cranial nerve deficit. She exhibits normal muscle tone. Coordination normal.  No ataxia on finger to nose bilaterally. No pronator drift. 5/5 strength throughout. CN 2-12 intact.Equal grip strength. Sensation intact.   Skin: Skin is warm.  Psychiatric: She has a normal mood and affect. Her behavior is normal.  Nursing note and vitals reviewed.    ED Treatments / Results  Labs (all labs ordered are listed, but only abnormal results are displayed) Labs Reviewed  LIPASE, BLOOD - Abnormal; Notable for the following components:      Result Value   Lipase 1,045 (*)    All other components within normal limits  COMPREHENSIVE METABOLIC PANEL - Abnormal; Notable for the following components:   Sodium 130 (*)    Potassium 2.4 (*)    Chloride 89 (*)    CO2  15 (*)    Glucose, Bld 255 (*)    BUN 96 (*)    Creatinine, Ser 9.24 (*)    Calcium 8.3 (*)    Total Protein 9.1 (*)    GFR calc non Af Amer 4 (*)    GFR calc Af Amer 5 (*)    Anion gap 26 (*)    All other components within normal  limits  CBC - Abnormal; Notable for the following components:   WBC 15.6 (*)    MCHC 36.3 (*)    All other components within normal limits  MAGNESIUM - Abnormal; Notable for the following components:   Magnesium 1.5 (*)    All other components within normal limits  BLOOD GAS, VENOUS - Abnormal; Notable for the following components:   pH, Ven 7.22 (*)    pCO2, Ven 29.2 (*)    pO2, Ven 203.0 (*)    Bicarbonate 13.4 (*)    Acid-base deficit 14.7 (*)    All other components within normal limits  I-STAT CG4 LACTIC ACID, ED - Abnormal; Notable for the following components:   Lactic Acid, Venous 2.49 (*)    All other components within normal limits  C DIFFICILE QUICK SCREEN W PCR REFLEX  GASTROINTESTINAL PANEL BY PCR, STOOL (REPLACES STOOL CULTURE)  URINALYSIS, ROUTINE W REFLEX MICROSCOPIC  POC OCCULT BLOOD, ED  I-STAT CG4 LACTIC ACID, ED  I-STAT CG4 LACTIC ACID, ED  Patient treated for  EKG EKG Interpretation  Date/Time:  Tuesday Mar 04 2018 01:41:25 EDT Ventricular Rate:  75 PR Interval:    QRS Duration: 113 QT Interval:  489 QTC Calculation: 547 R Axis:   98 Text Interpretation:  Atrial fibrillation Ventricular premature complex Borderline intraventricular conduction delay Borderline repolarization abnormality Prolonged QT interval Nonspecific ST abnormality Confirmed by Ezequiel Essex [phone]) on 03/04/2018 1:53:16 AM   Radiology Ct Abdomen Pelvis Wo Contrast  Result Date: 03/04/2018 CLINICAL DATA:  67 year old female with nausea vomiting abdominal pain and chills. EXAM: CT ABDOMEN AND PELVIS WITHOUT CONTRAST TECHNIQUE: Multidetector CT imaging of the abdomen and pelvis was performed following the standard protocol without IV contrast.  COMPARISON:  Abdominal CT dated 01/08/2018 FINDINGS: Evaluation of this exam is limited in the absence of intravenous contrast. Lower chest: The visualized lung bases are clear. There is coronary vascular calcification. No intra-abdominal free air or free fluid. Hepatobiliary: There is fatty infiltration of the liver. Mild irregularity of the liver contour may represent early changes of cirrhosis. Clinical correlation is recommended. No intrahepatic biliary ductal dilatation. Cholecystectomy. Pancreas: Unremarkable. No pancreatic ductal dilatation or surrounding inflammatory changes. Spleen: Normal in size without focal abnormality. Adrenals/Urinary Tract: Adrenal glands are unremarkable. Kidneys are normal, without renal calculi, focal lesion, or hydronephrosis. Bladder is unremarkable. Stomach/Bowel: There is thickened appearance of the stomach which may be related to underdistention or represent gastritis. Clinical correlation is recommended. Normal caliber fluid-filled loops of small bowel may be physiologic or represent enteritis. Loose stool noted throughout the colon compatible with diarrheal state. There is no bowel obstruction. The appendix is not visualized with certainty. No inflammatory changes identified in the right lower quadrant. Vascular/Lymphatic: Advanced aortoiliac atherosclerotic disease. The abdominal aorta and IVC are otherwise grossly unremarkable on this noncontrast CT. No portal venous gas. There is no adenopathy. Reproductive: Hysterectomy. There is a 12 mm rounded low attenuating structure in the region of the right adnexa along the round ligament similar to prior CT. Ultrasound may provide better evaluation. Other: None Musculoskeletal: Degenerative changes of the spine. No acute osseous pathology. IMPRESSION: 1. Diarrheal state with possible gastroenteritis. Clinical correlation is recommended. No bowel obstruction. 2. Mild fatty liver. 3.  Aortic Atherosclerosis (ICD10-I70.0).  Electronically Signed   By: Anner Crete M.D.   On: 03/04/2018 03:18    Procedures Procedures (including critical care time)  Medications Ordered in ED Medications  sodium chloride 0.9 % bolus 1,000 mL (1,000 mLs Intravenous New  Bag/Given 03/04/18 0039)  sodium chloride 0.9 % bolus 1,000 mL (has no administration in time range)  potassium chloride 10 mEq in 100 mL IVPB (has no administration in time range)  sodium bicarbonate injection 50 mEq (has no administration in time range)  ondansetron (ZOFRAN) injection 4 mg (4 mg Intravenous Given 03/04/18 0039)     Initial Impression / Assessment and Plan / ED Course  I have reviewed the triage vital signs and the nursing notes.  Pertinent labs & imaging results that were available during my care of the patient were reviewed by me and considered in my medical decision making (see chart for details).    Patient with a 4-day history of nausea, vomiting, diarrhea and abdominal pain.  She appears dry but has stable vital signs. Hemoccult is negative.  Patient hydrated and given symptom control.  Labs show AKI with creatinine of 9 and bicarb of 15.  Patient also with severe hypokalemia of 2.4. Lipase greater than 1000.  LFTs are normal.  Patient hydrated with lactated Ringer's, bicarb infusion. VBG shows metabolic acidosis.  CT scan is reassuring. Pancreas appears normal. patient treated for AKI, hypokalemia, hypomagnesemia. Suspect severe dehydration in setting of viral gastroenteritis.  Pancreatitis considered as well lipase.  Admission d/w Dr. Darrick Meigs. Continue hydration. Keep NPO.   CRITICAL CARE Performed by: Ezequiel Essex Total critical care time: 45 minutes Critical care time was exclusive of separately billable procedures and treating other patients. Critical care was necessary to treat or prevent imminent or life-threatening deterioration. Critical care was time spent personally by me on the following activities: development of  treatment plan with patient and/or surrogate as well as nursing, discussions with consultants, evaluation of patient's response to treatment, examination of patient, obtaining history from patient or surrogate, ordering and performing treatments and interventions, ordering and review of laboratory studies, ordering and review of radiographic studies, pulse oximetry and re-evaluation of patient's condition.    Final Clinical Impressions(s) / ED Diagnoses   Final diagnoses:  AKI (acute kidney injury) ([place])  Gastroenteritis  Metabolic acidosis  Elevated lipase    ED Discharge Orders    None       Kiyoko Mcguirt, Annie Main, MD 03/04/18 [phone]

## 2018-03-04 NOTE — H&P (Addendum)
TRH H&P    Patient Demographics:    Heather [name], is a 67 y.o. female  MRN: [id number]  DOB - 01-06-1951  Admit Date - 03/03/2018  Referring MD/NP/PA: Dr Wyvonnia Dusky  Outpatient Primary MD for the patient is [name], MD  Patient coming from: Home  Chief complaint- Vomiting, diarrhea   HPI:    Heather [name]  is a 66 y.o. female, with history of chronic kidney disease stage III, diabetes mellitus, hiatal hernia came to hospital with complaints of vomiting and diarrhea since Thursday.  Patient says that she was unable to keep anything down and kept on throwing up.  Also had multiple episodes of loose bowel movements. In the ED patient was found to have acute kidney injury with creatinine of 9.0 her baseline creatinine around 2.06 .  Also patient found to have,  Elevated lipase 1045, he also complains of abdominal pain. Denies chest pain or shortness of breath. No history of stroke or seizures. No fever or chills. No dysuria urgency or frequency of urination.   Review of systems:     All other systems reviewed and are negative.   With Past History of the following :    Past Medical History:  Diagnosis Date  . Acute renal failure superimposed on stage 3 chronic kidney disease ([place]) 01/21/2016  . B12 deficiency   . Cellulitis and abscess    Abdomen and buttocks  . CKD (chronic kidney disease) stage 3, GFR 30-59 ml/min ([facility])   . Closed fracture of distal end of femur, unspecified fracture morphology, initial encounter ([place])   . Depression   . Diabetic neuropathy ([place])   . DJD (degenerative joint disease)    Right forminal stenosis C4-5  . Essential hypertension   . Femur fracture, right ([place]) 03/30/2017  . Folliculitis   . Gout   . Headache(784.0)   . Hiatal hernia   . History of bronchitis   . History of cardiac catheterization    No significant CAD 2012  . Insomnia   . Irritable  bowel syndrome   . Mixed hyperlipidemia   . Osteoarthritis   . Palpitations   . Reflux esophagitis   . Tinnitus    Right  . Type 2 diabetes mellitus [facility])       Past Surgical History:  Procedure Laterality Date  . c-spine surgery     03/2007  . CARDIAC CATHETERIZATION    . CHOLECYSTECTOMY    . COLONOSCOPY  04/21/2011; 05/29/11   6/12: [place] - ?AVM at IC valve, inflammatory changes at [facility] valve [facility]); 7/12 - [facility]; IC valve erosions, look chronic and probably Crohn's per path  . ESOPHAGOGASTRODUODENOSCOPY  05/29/11   normal  . ESOPHAGOGASTRODUODENOSCOPY N/A 01/21/2016   Procedure: ESOPHAGOGASTRODUODENOSCOPY (EGD);  Surgeon: Wilford Corner, MD;  Location: [facility] ENDOSCOPY;  Service: Endoscopy;  Laterality: N/A;  . IR FLUORO GUIDE CV LINE RIGHT  04/01/2017  . IR US GUIDE VASC ACCESS RIGHT  04/01/2017  . JOINT REPLACEMENT    . ORIF FEMUR FRACTURE Right 03/31/2017   Procedure: OPEN REDUCTION  INTERNAL FIXATION (ORIF) DISTAL FEMUR FRACTURE;  Surgeon: Paralee Cancel, MD;  Location: [place];  Service: Orthopedics;  Laterality: Right;  . Right knee arthroscopic surgery    . TOTAL ABDOMINAL HYSTERECTOMY        Social History:      Social History   Tobacco Use  . Smoking status: Never Smoker  . Smokeless tobacco: Never Used  Substance Use Topics  . Alcohol use: No       Family History :     Family History  Problem Relation Age of Onset  . Diabetes Mother   . Colon cancer Neg Hx       Home Medications:   Prior to Admission medications   Medication Sig Start Date End Date Taking? Authorizing Provider  allopurinol (ZYLOPRIM) 100 MG tablet Take 100 mg by mouth daily.    [provider]  aspirin EC 81 MG tablet Take 81 mg by mouth daily.    [provider]  atorvastatin (LIPITOR) 80 MG tablet Take 80 mg by mouth daily.    [provider]  blood glucose meter kit and supplies KIT Dispense based on patient and insurance preference. Use up to  four times daily as directed. (FOR ICD-10 E11.65) 01/04/17   Cassandria Anger, MD  Blood Glucose Monitoring Suppl (ONETOUCH VERIO) w/Device KIT 1 each by Does not apply route as needed. Test 4 times daily 11/30/16   Cassandria Anger, MD  Cholecalciferol (VITAMIN D) 2000 units CAPS Take 2,000 Units by mouth daily.     [provider]  citalopram (CELEXA) 40 MG tablet Take 0.5 tablets (20 mg total) by mouth daily. 04/03/17   Rosita Fire, MD  cyanocobalamin (,VITAMIN B-12,) 1000 MCG/ML injection Inject 1,000 mcg into the muscle every 30 (thirty) days.      [provider]  diazepam (VALIUM) 5 MG tablet Take 1 tablet (5 mg total) by mouth 2 (two) times daily as needed for anxiety. 04/03/17   Rosita Fire, MD  esomeprazole (NEXIUM) 40 MG capsule Take 40 mg by mouth daily before breakfast.      [provider]  fenofibrate (TRICOR) 145 MG tablet Take 1 tablet (145 mg total) by mouth daily. 03/08/17   Gatha Mayer, MD  furosemide (LASIX) 40 MG tablet Take 1 tablet (40 mg total) by mouth daily. 01/26/16   Reyne Dumas, MD  gabapentin (NEURONTIN) 100 MG capsule Take 1 capsule (100 mg total) by mouth 3 (three) times daily. 01/03/18   Carmin Muskrat, MD  glucose blood (ONE TOUCH TEST STRIPS) test strip Use as instructed 4 x daily. E11.65 11/30/16   Cassandria Anger, MD  Insulin Detemir (LEVEMIR FLEXTOUCH) 100 UNIT/ML Pen Inject 80 Units into the skin at bedtime. 04/03/17   Rosita Fire, MD  Insulin Lispro (HUMALOG KWIKPEN ) Inject into the skin 3 (three) times daily with meals. Sliding scale    [provider]  liraglutide (VICTOZA) 18 MG/3ML SOPN USE 1.8 MG EVERY DAY AS DIRECTED 05/30/17   Cassandria Anger, MD  metoprolol (LOPRESSOR) 100 MG tablet Take 100 mg by mouth 2 (two) times daily.     [provider]  ondansetron (ZOFRAN) 4 MG tablet 1-2 every 6 hrs as needed for nausea, cramps, diarrhea 06/26/17   Gatha Mayer, MD    potassium chloride SA (K-DUR,KLOR-CON) 20 MEQ tablet Take 20 mEq by mouth daily.    [provider]     Allergies:  Allergies  Allergen Reactions  . Duloxetine Hcl Swelling  . Fluocinolone Swelling  . Acetaminophen Itching and Swelling    Pt states "some swelling"  . Amlodipine Besylate Swelling    Swelling of feet, fluid retention  . Ciprofloxacin Nausea And Vomiting and Swelling    Swelling of face, jaw, and lips  . Hydrocodone-Acetaminophen Itching  . Metformin Other (See Comments)    REACTION: GI UPSET  . Nsaids Other (See Comments)    "HAS BLEEDING ULCERS"  . Quinolones Swelling  . Sulfamethoxazole Swelling    Swelling of feet, legs  . Sulfonamide Derivatives Swelling  . Valsartan Swelling  . Cozaar [Losartan] Swelling and Rash  . Lisinopril Swelling and Rash    Oral swelling, and red streaks on arms/stomach     Physical Exam:   Vitals  Blood pressure (!) 130/57, pulse 67, temperature 97.9 F (36.6 C), temperature source Oral, resp. rate 11, height '5\' 3"'  (1.6 m), weight 69.9 kg (154 lb), SpO2 100 %.  1.  General: Appears in no acute distress  2. Psychiatric:  Intact judgement and  insight, awake alert, oriented x 3.  3. Neurologic: No focal neurological deficits, all cranial nerves intact.Strength 5/5 all 4 extremities, sensation intact all 4 extremities, plantars down going.  4. Eyes :  anicteric sclerae, moist conjunctivae with no lid lag. PERRLA.  5. ENMT:  Oropharynx clear with moist mucous membranes and good dentition  6. Neck:  supple, no cervical lymphadenopathy appriciated, No thyromegaly  7. Respiratory : Normal respiratory effort, good air movement bilaterally,clear to  auscultation bilaterally  8. Cardiovascular : RRR, no gallops, rubs or murmurs, no leg edema  9. Gastrointestinal:  Positive bowel sounds, abdomen soft, non-tender to palpation,no hepatosplenomegaly, no rigidity or guarding       10. Skin:  No cyanosis,  normal texture and turgor, no rash, lesions or ulcers  11.Musculoskeletal:  Good muscle tone,  joints appear normal , no effusions,  normal range of motion    Data Review:    CBC Recent Labs  Lab 03/04/18 0002  WBC 15.6*  HGB 14.8  HCT 40.8  PLT 367  MCV 82.9  MCH 30.1  MCHC 36.3*  RDW 13.8   ------------------------------------------------------------------------------------------------------------------  Chemistries  Recent Labs  Lab 03/04/18 0002  NA 130*  K 2.4*  CL 89*  CO2 15*  GLUCOSE 255*  BUN 96*  CREATININE 9.24*  CALCIUM 8.3*  MG 1.5*  AST 16  ALT 16  ALKPHOS 105  BILITOT 0.8   ------------------------------------------------------------------------------------------------------------------  ------------------------------------------------------------------------------------------------------------------ GFR: Estimated Creatinine Clearance: 5.7 mL/min (A) (by C-G formula based on SCr of 9.24 mg/dL (H)). Liver Function Tests: Recent Labs  Lab 03/04/18 0002  AST 16  ALT 16  ALKPHOS 105  BILITOT 0.8  PROT 9.1*  ALBUMIN 4.3   Recent Labs  Lab 03/04/18 0002  LIPASE 1,045*   No results for input(s): AMMONIA in the last 168 hours. Coagulation Profile: No results for input(s): INR, PROTIME in the last 168 hours. Cardiac Enzymes: No results for input(s): CKTOTAL, CKMB, CKMBINDEX, TROPONINI in the last 168 hours. BNP (last 3 results) No results for input(s): PROBNP in the last 8760 hours. HbA1C: No results for input(s): HGBA1C in the last 72 hours. CBG: No results for input(s): GLUCAP in the last 168 hours. Lipid Profile: No results for input(s): CHOL, HDL, LDLCALC, TRIG, CHOLHDL, LDLDIRECT in the last 72 hours. Thyroid Function Tests: No results for input(s): TSH, T4TOTAL, FREET4, T3FREE, THYROIDAB in the last 72 hours. Anemia  Panel: No results for input(s): VITAMINB12, FOLATE, FERRITIN, TIBC, IRON, RETICCTPCT in the last 72  hours.  --------------------------------------------------------------------------------------------------------------- Urine analysis:    Component Value Date/Time   COLORURINE YELLOW 01/03/2018 1600   APPEARANCEUR CLEAR 01/03/2018 1600   LABSPEC 1.012 01/03/2018 1600   PHURINE 5.0 01/03/2018 1600   GLUCOSEU 50 (A) 01/03/2018 1600   GLUCOSEU 100 (A) 08/21/2016 1602   HGBUR NEGATIVE 01/03/2018 1600   BILIRUBINUR NEGATIVE 01/03/2018 1600   KETONESUR NEGATIVE 01/03/2018 1600   PROTEINUR NEGATIVE 01/03/2018 1600   UROBILINOGEN 0.2 08/21/2016 1602   NITRITE NEGATIVE 01/03/2018 1600   LEUKOCYTESUR MODERATE (A) 01/03/2018 1600      Imaging Results:    Ct Abdomen Pelvis Wo Contrast  Result Date: 03/04/2018 CLINICAL DATA:  67 year old female with nausea vomiting abdominal pain and chills. EXAM: CT ABDOMEN AND PELVIS WITHOUT CONTRAST TECHNIQUE: Multidetector CT imaging of the abdomen and pelvis was performed following the standard protocol without IV contrast. COMPARISON:  Abdominal CT dated 01/08/2018 FINDINGS: Evaluation of this exam is limited in the absence of intravenous contrast. Lower chest: The visualized lung bases are clear. There is coronary vascular calcification. No intra-abdominal free air or free fluid. Hepatobiliary: There is fatty infiltration of the liver. Mild irregularity of the liver contour may represent early changes of cirrhosis. Clinical correlation is recommended. No intrahepatic biliary ductal dilatation. Cholecystectomy. Pancreas: Unremarkable. No pancreatic ductal dilatation or surrounding inflammatory changes. Spleen: Normal in size without focal abnormality. Adrenals/Urinary Tract: Adrenal glands are unremarkable. Kidneys are normal, without renal calculi, focal lesion, or hydronephrosis. Bladder is unremarkable. Stomach/Bowel: There is thickened appearance of the stomach which may be related to underdistention or represent gastritis. Clinical correlation is  recommended. Normal caliber fluid-filled loops of small bowel may be physiologic or represent enteritis. Loose stool noted throughout the colon compatible with diarrheal state. There is no bowel obstruction. The appendix is not visualized with certainty. No inflammatory changes identified in the right lower quadrant. Vascular/Lymphatic: Advanced aortoiliac atherosclerotic disease. The abdominal aorta and IVC are otherwise grossly unremarkable on this noncontrast CT. No portal venous gas. There is no adenopathy. Reproductive: Hysterectomy. There is a 12 mm rounded low attenuating structure in the region of the right adnexa along the round ligament similar to prior CT. Ultrasound may provide better evaluation. Other: None Musculoskeletal: Degenerative changes of the spine. No acute osseous pathology. IMPRESSION: 1. Diarrheal state with possible gastroenteritis. Clinical correlation is recommended. No bowel obstruction. 2. Mild fatty liver. 3.  Aortic Atherosclerosis (ICD10-I70.0). Electronically Signed   By: Anner Crete M.D.   On: 03/04/2018 03:18    My personal review of EKG: Rhythm NSR   Assessment & Plan:    Active Problems:   AKI (acute kidney injury) ([place])   1. Acute kidney injury-likely from dehydration from vomiting and diarrhea.  Patient has been started on IV fluids with sodium bicarb infusion.  Will consult nephrology in a.m.  Also obtain renal ultrasound. 2. Diabetes mellitus-start sliding scale insulin with NovoLog.   3. Acute pancreatitis-lipase is elevated to 1045, keep her n.p.o.  Continue IV fluids.  Check lipase in a.m.    DVT Prophylaxis-   Heparin  AM Labs Ordered, also please review Full Orders  Family Communication: Admission, patients condition and plan of care including tests being ordered have been discussed with the patient  who indicate understanding and agree with the plan and Code Status.  Code Status:  DNR  Admission status: Inpatient  Time spent in minutes  : 60 min  Oswald Hillock M.D on 03/04/2018 at 6:19 AM  Between 7am to 7pm - Pager - [phone]. After 7pm go to [email or web address] - password [facility]  [facility] - Office  [phone]

## 2018-03-04 NOTE — ED Notes (Signed)
1063ml bag of LR not available. AC notified.

## 2018-03-04 NOTE — Progress Notes (Addendum)
Patient seen and examined.  Admitted after midnight secondary to nausea, vomiting, abdominal pain and diarrhea.  Patient found to be dehydrated, with acute on chronic renal failure and blood work suggesting acute pancreatitis. Patient is hemodynamically stable, complaining of RUQ and mid epigastric discomfort. Please refer to H&P written by Dr. Darrick Meigs for further info/details on admission.   Plan: -continue IVF's -follow renal function and nephrology recommendations -will add PRN analgesics -continue NPO -continue PRN antiemetics -minimize/avoid nephrotoxic agents.  Barton Dubois MD [phone]

## 2018-03-04 NOTE — ED Notes (Signed)
CRITICAL VALUE ALERT  Critical Value:  Potassium 2.4  Date & Time Notied:  03/04/18 0055  Provider Notified: [facility] ED[facility]  Orders Received/Actions taken: No orders at this time

## 2018-03-04 NOTE — Consult Note (Signed)
Reason for Consult: Acute kidney injury superimposed on chronic Referring Physician: Dr. Zandra [name] is an 67 y.o. female.  HPI: She is a patient who has history of diabetes, irritable bowel syndrome, hypertension and stage III chronic renal failure presently came with complaints of abdominal pain, nausea, vomiting diarrhea about 7-10 times a day since Thursday.  Patient also complains of some weakness.  She was not able to eat or drink the last couple of days.  Presently she is feeling somewhat better.  Her last diarrhea was yesterday.  Presently consult called because of worsening of renal failure.  Past Medical History:  Diagnosis Date  . Acute renal failure superimposed on stage 3 chronic kidney disease ([place]) 01/21/2016  . B12 deficiency   . Cellulitis and abscess    Abdomen and buttocks  . CKD (chronic kidney disease) stage 3, GFR 30-59 ml/min (HCC)   . Closed fracture of distal end of femur, unspecified fracture morphology, initial encounter ([place])   . Depression   . Diabetic neuropathy ([place])   . DJD (degenerative joint disease)    Right forminal stenosis C4-5  . Essential hypertension   . Femur fracture, right ([place]) 03/30/2017  . Folliculitis   . Gout   . Headache(784.0)   . Hiatal hernia   . History of bronchitis   . History of cardiac catheterization    No significant CAD 2012  . Insomnia   . Irritable bowel syndrome   . Mixed hyperlipidemia   . Osteoarthritis   . Palpitations   . Reflux esophagitis   . Tinnitus    Right  . Type 2 diabetes mellitus [facility])     Past Surgical History:  Procedure Laterality Date  . c-spine surgery     03/2007  . CARDIAC CATHETERIZATION    . CHOLECYSTECTOMY    . COLONOSCOPY  04/21/2011; 05/29/11   6/12: [facility] - ?AVM at IC valve, inflammatory changes at [facility] valve [facility]); 7/12 - [facility]; IC valve erosions, look chronic and probably Crohn's per path  . ESOPHAGOGASTRODUODENOSCOPY  05/29/11   normal  .  ESOPHAGOGASTRODUODENOSCOPY N/A 01/21/2016   Procedure: ESOPHAGOGASTRODUODENOSCOPY (EGD);  Surgeon: Wilford Corner, MD;  Location: [facility] ENDOSCOPY;  Service: Endoscopy;  Laterality: N/A;  . IR FLUORO GUIDE CV LINE RIGHT  04/01/2017  . IR US GUIDE VASC ACCESS RIGHT  04/01/2017  . JOINT REPLACEMENT    . ORIF FEMUR FRACTURE Right 03/31/2017   Procedure: OPEN REDUCTION INTERNAL FIXATION (ORIF) DISTAL FEMUR FRACTURE;  Surgeon: Paralee Cancel, MD;  Location: [place];  Service: Orthopedics;  Laterality: Right;  . Right knee arthroscopic surgery    . TOTAL ABDOMINAL HYSTERECTOMY      Family History  Problem Relation Age of Onset  . Diabetes Mother   . Colon cancer Neg Hx     Social History:  reports that she has never smoked. She has never used smokeless tobacco. She reports that she does not drink alcohol or use drugs.  Allergies:  Allergies  Allergen Reactions  . Duloxetine Hcl Swelling  . Fluocinolone Swelling  . Acetaminophen Itching and Swelling    Pt states "some swelling"  . Amlodipine Besylate Swelling    Swelling of feet, fluid retention  . Ciprofloxacin Nausea And Vomiting and Swelling    Swelling of face, jaw, and lips  . Hydrocodone-Acetaminophen Itching  . Metformin Other (See Comments)    REACTION: GI UPSET  . Nsaids Other (See Comments)    "HAS BLEEDING ULCERS"  . Quinolones Swelling  .  Sulfamethoxazole Swelling    Swelling of feet, legs  . Sulfonamide Derivatives Swelling  . Valsartan Swelling  . Cozaar [Losartan] Swelling and Rash  . Lisinopril Swelling and Rash    Oral swelling, and red streaks on arms/stomach    Medications: I have reviewed the patient's current medications.  Results for orders placed or performed during the hospital encounter of 03/03/18 (from the past 48 hour(s))  Lipase, blood     Status: Abnormal   Collection Time: 03/04/18 12:02 AM  Result Value Ref Range   Lipase 1,045 (H) 11 - 51 U/L    Comment: RESULTS CONFIRMED BY MANUAL DILUTION Performed  at [facility], [address] [place], [place] [address]   Comprehensive metabolic panel     Status: Abnormal   Collection Time: 03/04/18 12:02 AM  Result Value Ref Range   Sodium 130 (L) 135 - 145 mmol/L   Potassium 2.4 (LL) 3.5 - 5.1 mmol/L    Comment: CRITICAL RESULT CALLED TO, READ BACK BY AND VERIFIED WITH: LONG,H. AT 7121 ON 03/04/2018 BY EVA    Chloride 89 (L) 101 - 111 mmol/L   CO2 15 (L) 22 - 32 mmol/L   Glucose, Bld 255 (H) 65 - 99 mg/dL   BUN 96 (H) 6 - 20 mg/dL   Creatinine, Ser 9.24 (H) 0.44 - 1.00 mg/dL   Calcium 8.3 (L) 8.9 - 10.3 mg/dL   Total Protein 9.1 (H) 6.5 - 8.1 g/dL   Albumin 4.3 3.5 - 5.0 g/dL   AST 16 15 - 41 U/L   ALT 16 14 - 54 U/L   Alkaline Phosphatase 105 38 - 126 U/L   Total Bilirubin 0.8 0.3 - 1.2 mg/dL   GFR calc non Af Amer 4 (L) >60 mL/min   GFR calc Af Amer 5 (L) >60 mL/min    Comment: (NOTE) The eGFR has been calculated using the CKD EPI equation. This calculation has not been validated in all clinical situations. eGFR's persistently <60 mL/min signify possible Chronic Kidney Disease.    Anion gap 26 (H) 5 - 15    Comment: Performed at [facility], [address], [place], [place] [address]  CBC     Status: Abnormal   Collection Time: 03/04/18 12:02 AM  Result Value Ref Range   WBC 15.6 (H) 4.0 - 10.5 K/uL   RBC 4.92 3.87 - 5.11 MIL/uL   Hemoglobin 14.8 12.0 - 15.0 g/dL   HCT 40.8 36.0 - 46.0 %   MCV 82.9 78.0 - 100.0 fL   MCH 30.1 26.0 - 34.0 pg   MCHC 36.3 (H) 30.0 - 36.0 g/dL   RDW 13.8 11.5 - 15.5 %   Platelets 367 150 - 400 K/uL    Comment: Performed at [facility], [address] [place], [place] [address]  Magnesium     Status: Abnormal   Collection Time: 03/04/18 12:02 AM  Result Value Ref Range   Magnesium 1.5 (L) 1.7 - 2.4 mg/dL    Comment: Performed at [facility], [address], [place], [place] [address]  I-Stat CG4 Lactic Acid, ED     Status: Abnormal   Collection Time: 03/04/18 12:31 AM  Result Value Ref  Range   Lactic Acid, Venous 2.49 (HH) 0.5 - 1.9 mmol/L   Comment NOTIFIED PHYSICIAN   I-Stat CG4 Lactic Acid, ED     Status: None   Collection Time: 03/04/18 12:45 AM  Result Value Ref Range   Lactic Acid, Venous 1.64 0.5 - 1.9  mmol/L  Blood gas, venous     Status: Abnormal   Collection Time: 03/04/18  1:16 AM  Result Value Ref Range   pH, Ven 7.22 (L) 7.250 - 7.430   pCO2, Ven 29.2 (L) 44.0 - 60.0 mmHg   pO2, Ven 203.0 (H) 32.0 - 45.0 mmHg   Bicarbonate 13.4 (L) 20.0 - 28.0 mmol/L   Acid-base deficit 14.7 (H) 0.0 - 2.0 mmol/L   O2 Saturation 98.3 %   Collection site RIGHT ANTECUBITAL    Drawn by [place]    Sample type ARTERIAL DRAW     Comment: Performed at [facility], [address] [place], [place] [address]  I-Stat CG4 Lactic Acid, ED     Status: None   Collection Time: 03/04/18  3:15 AM  Result Value Ref Range   Lactic Acid, Venous 1.21 0.5 - 1.9 mmol/L  Urinalysis, Routine w reflex microscopic     Status: Abnormal   Collection Time: 03/04/18  8:11 AM  Result Value Ref Range   Color, Urine YELLOW YELLOW   APPearance HAZY (A) CLEAR   Specific Gravity, Urine 1.014 1.005 - 1.030   pH 5.0 5.0 - 8.0   Glucose, UA NEGATIVE NEGATIVE mg/dL   Hgb urine dipstick SMALL (A) NEGATIVE   Bilirubin Urine NEGATIVE NEGATIVE   Ketones, ur NEGATIVE NEGATIVE mg/dL   Protein, ur NEGATIVE NEGATIVE mg/dL   Nitrite POSITIVE (A) NEGATIVE   Leukocytes, UA TRACE (A) NEGATIVE   RBC / HPF 0-5 0 - 5 RBC/hpf   WBC, UA 0-5 0 - 5 WBC/hpf   Bacteria, UA RARE (A) NONE SEEN   Squamous Epithelial / LPF 0-5 0 - 5    Comment: Please note change in reference range.   Mucus PRESENT    Hyaline Casts, UA PRESENT     Comment: Performed at [facility], [address], [place], [place] [address]    Ct Abdomen Pelvis Wo Contrast  Result Date: 03/04/2018 CLINICAL DATA:  67 year old female with nausea vomiting abdominal pain and chills. EXAM: CT ABDOMEN AND PELVIS WITHOUT CONTRAST TECHNIQUE:  Multidetector CT imaging of the abdomen and pelvis was performed following the standard protocol without IV contrast. COMPARISON:  Abdominal CT dated 01/08/2018 FINDINGS: Evaluation of this exam is limited in the absence of intravenous contrast. Lower chest: The visualized lung bases are clear. There is coronary vascular calcification. No intra-abdominal free air or free fluid. Hepatobiliary: There is fatty infiltration of the liver. Mild irregularity of the liver contour may represent early changes of cirrhosis. Clinical correlation is recommended. No intrahepatic biliary ductal dilatation. Cholecystectomy. Pancreas: Unremarkable. No pancreatic ductal dilatation or surrounding inflammatory changes. Spleen: Normal in size without focal abnormality. Adrenals/Urinary Tract: Adrenal glands are unremarkable. Kidneys are normal, without renal calculi, focal lesion, or hydronephrosis. Bladder is unremarkable. Stomach/Bowel: There is thickened appearance of the stomach which may be related to underdistention or represent gastritis. Clinical correlation is recommended. Normal caliber fluid-filled loops of small bowel may be physiologic or represent enteritis. Loose stool noted throughout the colon compatible with diarrheal state. There is no bowel obstruction. The appendix is not visualized with certainty. No inflammatory changes identified in the right lower quadrant. Vascular/Lymphatic: Advanced aortoiliac atherosclerotic disease. The abdominal aorta and IVC are otherwise grossly unremarkable on this noncontrast CT. No portal venous gas. There is no adenopathy. Reproductive: Hysterectomy. There is a 12 mm rounded low attenuating structure in the region of the right adnexa along the round ligament similar to prior CT. Ultrasound may provide  better evaluation. Other: None Musculoskeletal: Degenerative changes of the spine. No acute osseous pathology. IMPRESSION: 1. Diarrheal state with possible gastroenteritis. Clinical  correlation is recommended. No bowel obstruction. 2. Mild fatty liver. 3.  Aortic Atherosclerosis (ICD10-I70.0). Electronically Signed   By: Anner Crete M.D.   On: 03/04/2018 03:18    Review of Systems  Constitutional: Positive for chills, fever and malaise/fatigue.  Respiratory: Negative for shortness of breath.   Cardiovascular: Negative for orthopnea and leg swelling.  Gastrointestinal: Positive for abdominal pain, diarrhea, nausea and vomiting.  Genitourinary: Negative for frequency.   Blood pressure 138/60, pulse 68, temperature 97.7 F (36.5 C), temperature source Oral, resp. rate 20, height 5' 3"  (1.6 m), weight 64.8 kg (142 lb 13.7 oz), SpO2 100 %. Physical Exam  Constitutional: She is oriented to person, place, and time. No distress.  Eyes: No scleral icterus.  Neck: No JVD present.  Cardiovascular: Normal rate and regular rhythm.  Respiratory: No respiratory distress. She has no wheezes.  GI: She exhibits no distension. There is no tenderness.  Musculoskeletal: She exhibits no edema.  Neurological: She is alert and oriented to person, place, and time.    Assessment/Plan: 1] acute kidney injury superimposed on chronic.  This could be secondary to prerenal syndrome versus ATN. 2] chronic renal failure: Her creatinine was 1.42 on 12/13/2016.  Her creatinine has been ranging between 1.4-2 for the last 3 years.  Her creatinine was 1.64 on 06/17/2017 with EGFR of 33 cc/min/1.73.  She has also ultrasound on 03/31/2017 which showed right kidney to be 10.8 and left kidney 10.2.  Etiology could be secondary to diabetes versus hypertension. 3] hypertension: Her blood pressure is reasonably controlled 4] hypokalemia: Long-standing.  Patient has been on potassium supplement for some time.  Presently her potassium is very low possibly secondary to her diarrhea. 5] history of irritable bowel syndrome: Patient with chronic diarrhea 6] hypertension her blood pressure is reasonably  controlled 7] diabetes Plan: We will change her IV fluid to half-normal saline with 40 mEq of KCl at 135 cc/h We will check urine sodium and creatinine to check fractional excretion of sodium. 3] we will check a renal panel and follow patient.  Heather [name] S 03/04/2018, 9:31 AM

## 2018-03-04 NOTE — ED Notes (Signed)
Critical lactic was bad blood sample. Tube of blood had been sitting in room for over 40 minutes.  EDP made aware, new sample being drawn by this RN

## 2018-03-05 DIAGNOSIS — E43 Unspecified severe protein-calorie malnutrition: Secondary | ICD-10-CM

## 2018-03-05 LAB — COMPREHENSIVE METABOLIC PANEL
ALT: 13 U/L — ABNORMAL LOW (ref 14–54)
AST: 15 U/L (ref 15–41)
Albumin: 2.9 g/dL — ABNORMAL LOW (ref 3.5–5.0)
Alkaline Phosphatase: 68 U/L (ref 38–126)
Anion gap: 12 (ref 5–15)
BUN: 71 mg/dL — AB (ref 6–20)
CO2: 23 mmol/L (ref 22–32)
Calcium: 7.5 mg/dL — ABNORMAL LOW (ref 8.9–10.3)
Chloride: 99 mmol/L — ABNORMAL LOW (ref 101–111)
Creatinine, Ser: 3.91 mg/dL — ABNORMAL HIGH (ref 0.44–1.00)
GFR, EST AFRICAN AMERICAN: 13 mL/min — AB (ref >60)
GFR, EST NON AFRICAN AMERICAN: 11 mL/min — AB (ref >60)
Glucose, Bld: 170 mg/dL — ABNORMAL HIGH (ref 65–99)
POTASSIUM: 3 mmol/L — AB (ref 3.5–5.1)
Sodium: 134 mmol/L — ABNORMAL LOW (ref 135–145)
Total Bilirubin: 0.9 mg/dL (ref 0.3–1.2)
Total Protein: 5.9 g/dL — ABNORMAL LOW (ref 6.5–8.1)

## 2018-03-05 LAB — CBC
HEMATOCRIT: 26.6 % — AB (ref 36.0–46.0)
Hemoglobin: 9.3 g/dL — ABNORMAL LOW (ref 12.0–15.0)
MCH: 29.3 pg (ref 26.0–34.0)
MCHC: 35 g/dL (ref 30.0–36.0)
MCV: 83.9 fL (ref 78.0–100.0)
PLATELETS: 189 10*3/uL (ref 150–400)
RBC: 3.17 MIL/uL — ABNORMAL LOW (ref 3.87–5.11)
RDW: 13.4 % (ref 11.5–15.5)
WBC: 10.4 10*3/uL (ref 4.0–10.5)

## 2018-03-05 LAB — RENAL FUNCTION PANEL
Albumin: 2.9 g/dL — ABNORMAL LOW (ref 3.5–5.0)
Anion gap: 11 (ref 5–15)
BUN: 70 mg/dL — AB (ref 6–20)
CHLORIDE: 99 mmol/L — AB (ref 101–111)
CO2: 24 mmol/L (ref 22–32)
CREATININE: 3.91 mg/dL — AB (ref 0.44–1.00)
Calcium: 7.4 mg/dL — ABNORMAL LOW (ref 8.9–10.3)
GFR calc Af Amer: 13 mL/min — ABNORMAL LOW (ref >60)
GFR, EST NON AFRICAN AMERICAN: 11 mL/min — AB (ref >60)
Glucose, Bld: 169 mg/dL — ABNORMAL HIGH (ref 65–99)
Phosphorus: 2.7 mg/dL (ref 2.5–4.6)
Potassium: 3 mmol/L — ABNORMAL LOW (ref 3.5–5.1)
SODIUM: 134 mmol/L — AB (ref 135–145)

## 2018-03-05 LAB — URINALYSIS, ROUTINE W REFLEX MICROSCOPIC
BILIRUBIN URINE: NEGATIVE
GLUCOSE, UA: NEGATIVE mg/dL
KETONES UR: NEGATIVE mg/dL
NITRITE: NEGATIVE
PH: 6 (ref 5.0–8.0)
PROTEIN: NEGATIVE mg/dL
Specific Gravity, Urine: 1.006 (ref 1.005–1.030)
WBC, UA: >50 WBC/hpf — ABNORMAL HIGH (ref 0–5)

## 2018-03-05 LAB — HIV ANTIBODY (ROUTINE TESTING W REFLEX): HIV Screen 4th Generation wRfx: NONREACTIVE

## 2018-03-05 LAB — GLUCOSE, CAPILLARY
GLUCOSE-CAPILLARY: 166 mg/dL — AB (ref 65–99)
GLUCOSE-CAPILLARY: 282 mg/dL — AB (ref 65–99)
Glucose-Capillary: 168 mg/dL — ABNORMAL HIGH (ref 65–99)
Glucose-Capillary: 218 mg/dL — ABNORMAL HIGH (ref 65–99)

## 2018-03-05 LAB — LIPASE, BLOOD: LIPASE: 1151 U/L — AB (ref 11–51)

## 2018-03-05 MED ORDER — INSULIN DETEMIR 100 UNIT/ML ~~LOC~~ SOLN
10.0000 [IU] | Freq: Every day | SUBCUTANEOUS | Status: DC
  Administered 2018-03-05 – 2018-03-06 (×2): 10 [IU] via SUBCUTANEOUS
  Filled 2018-03-05 (×3): qty 0.1

## 2018-03-05 MED ORDER — ALLOPURINOL 100 MG PO TABS
50.0000 mg | ORAL_TABLET | Freq: Every day | ORAL | Status: DC
  Administered 2018-03-05 – 2018-03-13 (×9): 50 mg via ORAL
  Filled 2018-03-05 (×9): qty 1

## 2018-03-05 MED ORDER — GLUCERNA SHAKE PO LIQD
237.0000 mL | Freq: Three times a day (TID) | ORAL | Status: DC
  Administered 2018-03-05 – 2018-03-13 (×20): 237 mL via ORAL

## 2018-03-05 MED ORDER — OXYCODONE HCL 5 MG PO TABS
5.0000 mg | ORAL_TABLET | Freq: Four times a day (QID) | ORAL | Status: DC | PRN
  Administered 2018-03-05 – 2018-03-09 (×7): 5 mg via ORAL
  Filled 2018-03-05 (×7): qty 1

## 2018-03-05 MED ORDER — SODIUM CHLORIDE 0.9% FLUSH
10.0000 mL | INTRAVENOUS | Status: DC | PRN
  Administered 2018-03-13: 10 mL
  Filled 2018-03-05: qty 40

## 2018-03-05 MED ORDER — PHENAZOPYRIDINE HCL 100 MG PO TABS: 100.0000 mg | ORAL_TABLET | Freq: Three times a day (TID) | ORAL | Status: DC

## 2018-03-05 MED ORDER — POTASSIUM CHLORIDE 20 MEQ/15ML (10%) PO SOLN
40.0000 meq | Freq: Once | ORAL | Status: AC
  Administered 2018-03-05: 40 meq via ORAL
  Filled 2018-03-05: qty 30

## 2018-03-05 MED ORDER — ADULT MULTIVITAMIN W/MINERALS CH
1.0000 | ORAL_TABLET | Freq: Every day | ORAL | Status: DC
  Administered 2018-03-05 – 2018-03-13 (×9): 1 via ORAL
  Filled 2018-03-05 (×9): qty 1

## 2018-03-05 MED ORDER — SODIUM CHLORIDE 0.9% FLUSH
10.0000 mL | Freq: Two times a day (BID) | INTRAVENOUS | Status: DC
  Administered 2018-03-07 – 2018-03-09 (×6): 10 mL
  Administered 2018-03-10: 20 mL
  Administered 2018-03-10 – 2018-03-13 (×4): 10 mL

## 2018-03-05 MED ORDER — OXYBUTYNIN CHLORIDE 5 MG PO TABS
2.5000 mg | ORAL_TABLET | Freq: Two times a day (BID) | ORAL | Status: DC
  Administered 2018-03-05 – 2018-03-13 (×17): 2.5 mg via ORAL
  Filled 2018-03-05 (×17): qty 1

## 2018-03-05 NOTE — Progress Notes (Signed)
Initial Nutrition Assessment  DOCUMENTATION CODES:   Severe malnutrition in context of acute illness/injury  INTERVENTION:  Glucerna Shake po TID, each supplement provides 220 kcal and 10 grams of protein   Multivitamin Daily   NUTRITION DIAGNOSIS:   Severe Malnutrition related to acute illness, diarrhea, nausea, vomiting(Since last thursday (5 days)) as evidenced by percent weight loss, per patient/family report, meal completion < 50% for >/= 5 days.  GOAL:   Patient will meet greater than or equal to 90% of their needs(Resolve GI symptoms )  MONITOR:   Diet advancement, Weight trends, Labs, I & O's  REASON FOR ASSESSMENT:   Malnutrition Screening Tool    ASSESSMENT:  Patient presents with primary complaint of nausea and vomiting since last Thursday. .Acute kidney injury-likely from dehydration from vomiting and diarrhea. Receiving IV fluids and renal ultrasound obtained.. Acute pancreatitis -per MD. Lipase 1151 this morning. Albumin 2.9, Sodium 134, Potassium 3.0. BUN 70, Cr 3.91. Occurrence of acute significant weight loss of 11 lb since 15 days (7% ) from 154 lb to current 142.13 lb.  Her usual weight ranges between 154-164 lb the past 15 months. Inadequate oral intake associated with persistent GI symptoms noted above. Heather [name] says she has been unable to tolerate food or beverages since Thursday. Her diet has advanced to full liquids now and she is waiting for her first tray. She eaten a couple of servings of ice cream.  Home diet is regular. Patient says meals consists of home cooked and resturant style.   Labs: BMP Latest Ref Rng & Units 03/05/2018 03/05/2018 03/04/2018  Glucose 65 - 99 mg/dL 169(H) 170(H) -  BUN 6 - 20 mg/dL 70(H) 71(H) -  Creatinine 0.44 - 1.00 mg/dL 3.91(H) 3.91(H) 6.95(H)  BUN/Creat Ratio 12 - 28 - - -  Sodium 135 - 145 mmol/L 134(L) 134(L) -  Potassium 3.5 - 5.1 mmol/L 3.0(L) 3.0(L) -  Chloride 101 - 111 mmol/L 99(L) 99(L) -  CO2 22 - 32 mmol/L 24  23 -  Calcium 8.9 - 10.3 mg/dL 7.4(L) 7.5(L) -    Scheduled Meds: . aspirin EC  81 mg Oral Daily  . citalopram  20 mg Oral Daily  . gabapentin  100 mg Oral TID  . heparin  5,000 Units Subcutaneous Q8H  . insulin aspart  0-9 Units Subcutaneous TID WC  . metoprolol tartrate  100 mg Oral BID    Past Medical History:  Diagnosis Date  . Acute renal failure superimposed on stage 3 chronic kidney disease ([place]) 01/21/2016  . B12 deficiency   . Cellulitis and abscess    Abdomen and buttocks  . CKD (chronic kidney disease) stage 3, GFR 30-59 ml/min (HCC)   . Closed fracture of distal end of femur, unspecified fracture morphology, initial encounter ([place])   . Depression   . Diabetic neuropathy ([place])   . DJD (degenerative joint disease)    Right forminal stenosis C4-5  . Essential hypertension   . Femur fracture, right ([place]) 03/30/2017  . Folliculitis   . Gout   . Headache(784.0)   . Hiatal hernia   . History of bronchitis   . History of cardiac catheterization    No significant CAD 2012  . Insomnia   . Irritable bowel syndrome   . Mixed hyperlipidemia   . Osteoarthritis   . Palpitations   . Reflux esophagitis   . Tinnitus    Right  . Type 2 diabetes mellitus ([place])     Past Surgical History:  Procedure  Laterality Date  . c-spine surgery     03/2007  . CARDIAC CATHETERIZATION    . CHOLECYSTECTOMY    . COLONOSCOPY  04/21/2011; 05/29/11   6/12: [facility] - ?AVM at IC valve, inflammatory changes at [facility] valve [facility]); 7/12 - [facility]; IC valve erosions, look chronic and probably Crohn's per path  . ESOPHAGOGASTRODUODENOSCOPY  05/29/11   normal  . ESOPHAGOGASTRODUODENOSCOPY N/A 01/21/2016   Procedure: ESOPHAGOGASTRODUODENOSCOPY (EGD);  Surgeon: Wilford Corner, MD;  Location: [facility] ENDOSCOPY;  Service: Endoscopy;  Laterality: N/A;  . IR FLUORO GUIDE CV LINE RIGHT  04/01/2017  . IR US GUIDE VASC ACCESS RIGHT  04/01/2017  . JOINT REPLACEMENT    . ORIF FEMUR FRACTURE Right 03/31/2017    Procedure: OPEN REDUCTION INTERNAL FIXATION (ORIF) DISTAL FEMUR FRACTURE;  Surgeon: Paralee Cancel, MD;  Location: [place];  Service: Orthopedics;  Laterality: Right;  . Right knee arthroscopic surgery    . TOTAL ABDOMINAL HYSTERECTOMY      NUTRITION - FOCUSED PHYSICAL EXAM:    Most Recent Value  Orbital Region  Mild depletion  Upper Arm Region  Moderate depletion  Thoracic and Lumbar Region  Mild depletion  Buccal Region  No depletion  Temple Region  No depletion  Clavicle Bone Region  Mild depletion  Clavicle and Acromion Bone Region  Moderate depletion  Scapular Bone Region  Mild depletion  Dorsal Hand  Unable to assess  Patellar Region  No depletion  Anterior Thigh Region  No depletion  Posterior Calf Region  No depletion  Edema (RD Assessment)  None  Hair  Reviewed  Eyes  Reviewed  Mouth  Reviewed  Skin  Reviewed  Nails  Reviewed     Diet Order:   Diet Order           Diet NPO time specified  Diet effective now          EDUCATION NEEDS:   No education needs have been identified at this time Skin:  Skin Assessment: Reviewed RN Assessment  Last BM:  5/8 loose stool- the first one today per pt   Height:   Ht Readings from Last 1 Encounters:  03/04/18 5\' 3"  (1.6 m)    Weight:   Wt Readings from Last 1 Encounters:  03/04/18 142 lb 13.7 oz (64.8 kg)    Ideal Body Weight:  52.2 kg  BMI:  Body mass index is 25.31 kg/m.  Estimated Nutritional Needs:   Kcal:  1800-2000  Protein:  52-59 gr   Fluid:  1.8-2.0 liters daily normal needs  Colman Cater Heather,RD,CSG,LDN Office: [phone] Pager: [phone]

## 2018-03-05 NOTE — Progress Notes (Signed)
Heather [name]  MRN: [id number]  DOB/AGE: 07-03-1951 67 y.o.  Primary Care Physician:Shah, Weldon Picking, MD  Admit date: 03/03/2018  Chief Complaint:  Chief Complaint  Patient presents with  . Abdominal Pain    S-Pt presented on  03/03/2018 with  Chief Complaint  Patient presents with  . Abdominal Pain  .    Pt says " I am feeling  Better than when I came "   Pt main concern was " When will I be able to eat? "  Meds . allopurinol  50 mg Oral Daily  . aspirin EC  81 mg Oral Daily  . citalopram  20 mg Oral Daily  . gabapentin  100 mg Oral TID  . heparin  5,000 Units Subcutaneous Q8H  . insulin aspart  0-9 Units Subcutaneous TID WC  . metoprolol tartrate  100 mg Oral BID     Physical Exam: Vital signs in last 24 hours: Temp:  [97.6 F (36.4 C)-98.2 F (36.8 C)] 98.2 F (36.8 C) (05/08 0601) Pulse Rate:  [59-70] 70 (05/08 0601) Resp:  [18-20] 20 (05/08 0601) BP: (113-137)/(48-55) 137/52 (05/08 0601) SpO2:  [100 %] 100 % (05/08 0601) Weight change: -11 lb 2.3 oz (-5.054 kg) Last BM Date: 03/03/18  Intake/Output from previous day: 05/07 0701 - 05/08 0700 In: 3807.3 [I.V.:3807.3] Out: 2400 [Urine:2400] No intake/output data recorded.   Physical Exam: General- pt is awake,alert, oriented to time place and person Resp- No acute REsp distress, CTA B/L NO Rhonchi CVS- S1S2 regular in rate and rhythm GIT- BS+, soft, ND, tenderness on palpation EXT- NO LE Edema, NO Cyanosis   Lab Results: CBC Recent Labs    03/04/18 1014 03/05/18 0550  WBC 10.9* 10.4  HGB 11.5* 9.3*  HCT 32.3* 26.6*  PLT 289 189    BMET Recent Labs    03/04/18 0002 03/04/18 1014 03/05/18 0550  NA 130*  --  134*  134*  K 2.4*  --  3.0*  3.0*  CL 89*  --  99*  99*  CO2 15*  --  24  23  GLUCOSE 255*  --  169*  170*  BUN 96*  --  70*  71*  CREATININE 9.24* 6.95* 3.91*  3.91*  CALCIUM 8.3*  --  7.4*  7.5*   Creat trend 2019    9.24=> 6.95=> 3.91 2018    1.4--2.4 2017     1.6--5.8 2016    1.4 2012    1.4--1.6   Lab Results  Component Value Date   CALCIUM 7.5 (L) 03/05/2018   CALCIUM 7.4 (L) 03/05/2018   CAION 1.18 12/13/2010   PHOS 2.7 03/05/2018           Impression: 1)Renal  AKI secondary to Prerenal/ATN                AKI on CKD               CKD stage 3.               CKD since 2012               CKD secondary to HTN/Multiple AKI                Progression of CKD marked with AKI                Hematuria none .                Nephrolithiasis Hx Absent  AKI better                 Creat trending down  2)HTN  Medication-  On Beta blockers    3)Anemia HGb low Primary team folowing  4)CKD Mineral-Bone Disorder Phos at goal Calcium at goal.  5)GI-admitted with pancreatitis PMD following  6)Electrolytes  Being replete   Hypokalemic   better Hyponatremic   better  7)Acid base Co2 at goal     Plan:  will continue current care     Clinton S 03/05/2018, 11:00 AM

## 2018-03-05 NOTE — Care Management Note (Signed)
Case Management Note  Patient Details  Name: Heather [name] MRN: [id number] Date of Birth: 08-May-1951  Subjective/Objective:  Adm with AKI. From home with husband. Ind with ADL's. Still drives. Has PCP, insurance with prescription coverage. Gets prescriptions filled at [facility] in [place].                   Action/Plan: DC home with self care. No CM needs known or communicated.    Expected Discharge Date:   03/06/2018               Expected Discharge Plan:  Home/Self Care  In-House Referral:     Discharge planning Services  CM Consult  Post Acute Care Choice:  NA Choice offered to:  NA  DME Arranged:    DME Agency:     HH Arranged:    HH Agency:     Status of Service:  Completed, signed off  If discussed at [employer] of Stay Meetings, dates discussed:    Additional Comments:  Juaquin Ludington, Chauncey Reading, RN 03/05/2018, 2:45 PM

## 2018-03-05 NOTE — Care Management Important Message (Signed)
Important Message  Patient Details  Name: Heather [name] MRN: [id number] Date of Birth: 1951-02-09   Medicare Important Message Given:  Yes    Shelda Altes 03/05/2018, 12:09 PM

## 2018-03-05 NOTE — Progress Notes (Signed)
PROGRESS NOTE    [name]  AOZ:[id number] DOB: 12/08/1950 DOA: 03/03/2018 PCP: [name], MD   Brief Narrative:   Heather [name]  is a 67 y.o. female, with history of chronic kidney disease stage III, diabetes mellitus, hiatal hernia came to hospital with complaints of vomiting and diarrhea since Thursday.    She has been admitted with acute pancreatitis as well as acute kidney injury suspected secondary to dehydration from vomiting and diarrhea.  Nephrology has been consulted for evaluation and management of AKI.   Assessment & Plan:   Active Problems:   AKI (acute kidney injury) ([place])   1. Acute pancreatitis-resolving.  She has had multiple prior episodes of this and does follow with gastroenterology in the outpatient setting.  Continue to advance diet which has been advanced from clears to full liquid today and continue IV fluid. 2. AKI secondary to dehydration.  Continue IV fluids per nephrology.  Appreciate further recommendations.  Avoid nephrotoxic agents and recheck renal panel in a.m. 3. Diabetes mellitus with mild to moderate hyperglycemia.  Continue sliding scale insulin and restart portion of home Levemir with 10 units daily per diabetes coordinator recommendations as diet is being advanced.   DVT prophylaxis:Heparin Code Status: DNR Family Communication: None at bedside Disposition Plan: Continue diet advancement and monitoring of renal function on IV fluid per nephrology.   Consultants:   Nephrology  Procedures:   None  Antimicrobials:   None   Subjective: Patient seen and evaluated today with no new acute complaints or concerns. No acute concerns or events noted overnight.  Not had any further nausea or vomiting noted, nor has she had a loose bowel movement.  Objective: Vitals:   03/04/18 0800 03/04/18 1317 03/04/18 2148 03/05/18 0601  BP: 138/60 (!) 113/48 (!) 123/55 (!) 137/52  Pulse: 68 (!) 59 (!) 59 70  Resp: 20 18 18 20   Temp:  97.7 F (36.5 C) 97.6 F (36.4 C) 97.8 F (36.6 C) 98.2 F (36.8 C)  TempSrc: Oral Oral Oral Oral  SpO2: 100% 100% 100% 100%  Weight: 64.8 kg (142 lb 13.7 oz)     Height: 5\' 3"  (1.6 m)       Intake/Output Summary (Last 24 hours) at 03/05/2018 1240 Last data filed at 03/05/2018 0900 Gross per 24 hour  Intake 3064 ml  Output 1800 ml  Net 1264 ml   Filed Weights   03/03/18 2342 03/04/18 0800  Weight: 69.9 kg (154 lb) 64.8 kg (142 lb 13.7 oz)    Examination:  General exam: Appears calm and comfortable  Respiratory system: Clear to auscultation. Respiratory effort normal. Cardiovascular system: S1 & S2 heard, RRR. No JVD, murmurs, rubs, gallops or clicks. No pedal edema. Gastrointestinal system: Abdomen is nondistended, soft and nontender. No organomegaly or masses felt. Normal bowel sounds heard. Central nervous system: Alert and oriented. No focal neurological deficits. Extremities: Symmetric 5 x 5 power. Skin: No rashes, lesions or ulcers Psychiatry: Judgement and insight appear normal. Mood & affect appropriate.     Data Reviewed: I have personally reviewed following labs and imaging studies  CBC: Recent Labs  Lab 03/04/18 0002 03/04/18 1014 03/05/18 0550  WBC 15.6* 10.9* 10.4  HGB 14.8 11.5* 9.3*  HCT 40.8 32.3* 26.6*  MCV 82.9 82.4 83.9  PLT 367 289 962   Basic Metabolic Panel: Recent Labs  Lab 03/04/18 0002 03/04/18 1014 03/05/18 0550  NA 130*  --  134*  134*  K 2.4*  --  3.0*  3.0*  CL 89*  --  99*  99*  CO2 15*  --  24  23  GLUCOSE 255*  --  169*  170*  BUN 96*  --  70*  71*  CREATININE 9.24* 6.95* 3.91*  3.91*  CALCIUM 8.3*  --  7.4*  7.5*  MG 1.5*  --   --   PHOS  --   --  2.7   GFR: Estimated Creatinine Clearance: 13 mL/min (A) (by C-G formula based on SCr of 3.91 mg/dL (H)). Liver Function Tests: Recent Labs  Lab 03/04/18 0002 03/05/18 0550  AST 16 15  ALT 16 13*  ALKPHOS 105 68  BILITOT 0.8 0.9  PROT 9.1* 5.9*  ALBUMIN 4.3  2.9*  2.9*   Recent Labs  Lab 03/04/18 0002 03/05/18 0550  LIPASE 1,045* 1,151*   No results for input(s): AMMONIA in the last 168 hours. Coagulation Profile: No results for input(s): INR, PROTIME in the last 168 hours. Cardiac Enzymes: No results for input(s): CKTOTAL, CKMB, CKMBINDEX, TROPONINI in the last 168 hours. BNP (last 3 results) No results for input(s): PROBNP in the last 8760 hours. HbA1C: No results for input(s): HGBA1C in the last 72 hours. CBG: Recent Labs  Lab 03/04/18 1115 03/04/18 1634 03/05/18 0118 03/05/18 0558 03/05/18 1142  GLUCAP 287* 229* 166* 168* 282*   Lipid Profile: No results for input(s): CHOL, HDL, LDLCALC, TRIG, CHOLHDL, LDLDIRECT in the last 72 hours. Thyroid Function Tests: No results for input(s): TSH, T4TOTAL, FREET4, T3FREE, THYROIDAB in the last 72 hours. Anemia Panel: No results for input(s): VITAMINB12, FOLATE, FERRITIN, TIBC, IRON, RETICCTPCT in the last 72 hours. Sepsis Labs: Recent Labs  Lab 03/04/18 0031 03/04/18 0045 03/04/18 0315  LATICACIDVEN 2.49* 1.64 1.21    No results found for this or any previous visit (from the past 240 hour(s)).       Radiology Studies: Ct Abdomen Pelvis Wo Contrast  Result Date: 03/04/2018 CLINICAL DATA:  67 year old female with nausea vomiting abdominal pain and chills. EXAM: CT ABDOMEN AND PELVIS WITHOUT CONTRAST TECHNIQUE: Multidetector CT imaging of the abdomen and pelvis was performed following the standard protocol without IV contrast. COMPARISON:  Abdominal CT dated 01/08/2018 FINDINGS: Evaluation of this exam is limited in the absence of intravenous contrast. Lower chest: The visualized lung bases are clear. There is coronary vascular calcification. No intra-abdominal free air or free fluid. Hepatobiliary: There is fatty infiltration of the liver. Mild irregularity of the liver contour may represent early changes of cirrhosis. Clinical correlation is recommended. No intrahepatic biliary  ductal dilatation. Cholecystectomy. Pancreas: Unremarkable. No pancreatic ductal dilatation or surrounding inflammatory changes. Spleen: Normal in size without focal abnormality. Adrenals/Urinary Tract: Adrenal glands are unremarkable. Kidneys are normal, without renal calculi, focal lesion, or hydronephrosis. Bladder is unremarkable. Stomach/Bowel: There is thickened appearance of the stomach which may be related to underdistention or represent gastritis. Clinical correlation is recommended. Normal caliber fluid-filled loops of small bowel may be physiologic or represent enteritis. Loose stool noted throughout the colon compatible with diarrheal state. There is no bowel obstruction. The appendix is not visualized with certainty. No inflammatory changes identified in the right lower quadrant. Vascular/Lymphatic: Advanced aortoiliac atherosclerotic disease. The abdominal aorta and IVC are otherwise grossly unremarkable on this noncontrast CT. No portal venous gas. There is no adenopathy. Reproductive: Hysterectomy. There is a 12 mm rounded low attenuating structure in the region of the right adnexa along the round ligament similar to prior CT. Ultrasound may provide better evaluation. Other: None Musculoskeletal: Degenerative  changes of the spine. No acute osseous pathology. IMPRESSION: 1. Diarrheal state with possible gastroenteritis. Clinical correlation is recommended. No bowel obstruction. 2. Mild fatty liver. 3.  Aortic Atherosclerosis (ICD10-I70.0). Electronically Signed   By: Anner Crete M.D.   On: 03/04/2018 03:18   US Renal  Result Date: 03/04/2018 CLINICAL DATA:  Acute renal insufficiency, diabetes, hyperlipidemia EXAM: RENAL / URINARY TRACT ULTRASOUND COMPLETE COMPARISON:  CT abdomen pelvis of 03/04/2017 FINDINGS: Right Kidney: Length: 10.2 cm. No hydronephrosis is seen. The echogenicity of the renal parenchyma is increased consistent with chronic renal medical disease. Left Kidney: Length: 10.2  cm. No hydronephrosis is seen. There is a small exophytic cyst from upper pole posteriorly measuring 1 cm in diameter. The echogenicity of the renal parenchyma is increased consistent with chronic renal medical disease. Bladder: The urinary bladder is not well distended, and ureteral jets were not visualized. IMPRESSION: 1. No hydronephrosis. 2. Echogenic renal parenchyma bilaterally consistent with chronic renal medical disease. 3. Incidental note of echogenic liver parenchyma consistent with hepatic steatosis. Electronically Signed   By: Ivar Drape M.D.   On: 03/04/2018 11:39        Scheduled Meds: . allopurinol  50 mg Oral Daily  . aspirin EC  81 mg Oral Daily  . citalopram  20 mg Oral Daily  . gabapentin  100 mg Oral TID  . heparin  5,000 Units Subcutaneous Q8H  . insulin aspart  0-9 Units Subcutaneous TID WC  . insulin detemir  10 Units Subcutaneous Daily  . metoprolol tartrate  100 mg Oral BID   Continuous Infusions: . sodium chloride 0.45 % with kcl 135 mL/hr at 03/05/18 1021     LOS: 1 day    Time spent: 30 minutes    Teryn Gust Darleen Crocker, DO [facility] Pager [phone]  If 7PM-7AM, please contact night-coverage [email or web address] Password [email or web address] 03/05/2018, 12:40 PM

## 2018-03-05 NOTE — Progress Notes (Signed)
Inpatient Diabetes Program Recommendations  AACE/ADA: New Consensus Statement on Inpatient Glycemic Control (2015)  Target Ranges:  Prepandial:   less than 140 mg/dL      Peak postprandial:   less than 180 mg/dL (1-2 hours)      Critically ill patients:  140 - 180 mg/dL   Lab Results  Component Value Date   GLUCAP 168 (H) 03/05/2018   HGBA1C 8.9 03/07/2017    Review of Glycemic Control Results for Heather [name], Heather A "[name]" (MRN [id number]) as of 03/05/2018 10:17  Ref. Range 03/04/2018 11:15 03/04/2018 16:34 03/05/2018 01:18 03/05/2018 05:58  Glucose-Capillary Latest Ref Range: 65 - 99 mg/dL 287 (H) 229 (H) 166 (H) 168 (H)   Diabetes history: Type 2 DM Outpatient Diabetes medications: Levemir 80 units QHS, Humalog TID per SSI, Victoza 1.8 mg QD Current orders for Inpatient glycemic control: Novolog 0-9 units TID  Inpatient Diabetes Program Recommendations:    May want to consider restarting a portion of patient's home basal. Consider restarting Levemir 10 units QD (64.5 kg x 0.15) (Home dose is Levemir 80 units QD).  Thanks, Bronson Curb, MSN, RNC-OB Diabetes Coordinator [phone] (8a-5p)

## 2018-03-06 LAB — COMPREHENSIVE METABOLIC PANEL
ALBUMIN: 3 g/dL — AB (ref 3.5–5.0)
ALK PHOS: 69 U/L (ref 38–126)
ALT: 16 U/L (ref 14–54)
AST: 18 U/L (ref 15–41)
Anion gap: 11 (ref 5–15)
BUN: 48 mg/dL — AB (ref 6–20)
CALCIUM: 8.3 mg/dL — AB (ref 8.9–10.3)
CHLORIDE: 102 mmol/L (ref 101–111)
CO2: 22 mmol/L (ref 22–32)
CREATININE: 2.06 mg/dL — AB (ref 0.44–1.00)
GFR calc Af Amer: 28 mL/min — ABNORMAL LOW (ref >60)
GFR calc non Af Amer: 24 mL/min — ABNORMAL LOW (ref >60)
GLUCOSE: 307 mg/dL — AB (ref 65–99)
Potassium: 3.5 mmol/L (ref 3.5–5.1)
SODIUM: 135 mmol/L (ref 135–145)
Total Bilirubin: 0.4 mg/dL (ref 0.3–1.2)
Total Protein: 6 g/dL — ABNORMAL LOW (ref 6.5–8.1)

## 2018-03-06 LAB — CBC
HCT: 27 % — ABNORMAL LOW (ref 36.0–46.0)
HEMOGLOBIN: 9.4 g/dL — AB (ref 12.0–15.0)
MCH: 29.8 pg (ref 26.0–34.0)
MCHC: 34.8 g/dL (ref 30.0–36.0)
MCV: 85.7 fL (ref 78.0–100.0)
Platelets: 200 10*3/uL (ref 150–400)
RBC: 3.15 MIL/uL — ABNORMAL LOW (ref 3.87–5.11)
RDW: 14.2 % (ref 11.5–15.5)
WBC: 10.7 10*3/uL — ABNORMAL HIGH (ref 4.0–10.5)

## 2018-03-06 LAB — GASTROINTESTINAL PANEL BY PCR, STOOL (REPLACES STOOL CULTURE)
ADENOVIRUS F40/41: NOT DETECTED
ASTROVIRUS: NOT DETECTED
CRYPTOSPORIDIUM: NOT DETECTED
CYCLOSPORA CAYETANENSIS: NOT DETECTED
Campylobacter species: NOT DETECTED
ENTAMOEBA HISTOLYTICA: NOT DETECTED
ENTEROPATHOGENIC E COLI (EPEC): NOT DETECTED
ENTEROTOXIGENIC E COLI (ETEC): NOT DETECTED
Enteroaggregative E coli (EAEC): NOT DETECTED
GIARDIA LAMBLIA: NOT DETECTED
Norovirus GI/GII: NOT DETECTED
Plesimonas shigelloides: NOT DETECTED
Rotavirus A: NOT DETECTED
Salmonella species: DETECTED — AB
Sapovirus (I, II, IV, and V): NOT DETECTED
Shiga like toxin producing E coli (STEC): NOT DETECTED
Shigella/Enteroinvasive E coli (EIEC): NOT DETECTED
VIBRIO CHOLERAE: NOT DETECTED
VIBRIO SPECIES: NOT DETECTED
YERSINIA ENTEROCOLITICA: NOT DETECTED

## 2018-03-06 LAB — GLUCOSE, CAPILLARY
Glucose-Capillary: 247 mg/dL — ABNORMAL HIGH (ref 65–99)
Glucose-Capillary: 287 mg/dL — ABNORMAL HIGH (ref 65–99)
Glucose-Capillary: 314 mg/dL — ABNORMAL HIGH (ref 65–99)
Glucose-Capillary: 351 mg/dL — ABNORMAL HIGH (ref 65–99)
Glucose-Capillary: 367 mg/dL — ABNORMAL HIGH (ref 65–99)

## 2018-03-06 MED ORDER — FLUCONAZOLE 100 MG PO TABS
200.0000 mg | ORAL_TABLET | Freq: Once | ORAL | Status: AC
  Administered 2018-03-06: 200 mg via ORAL
  Filled 2018-03-06: qty 2

## 2018-03-06 MED ORDER — PANTOPRAZOLE SODIUM 40 MG PO TBEC
40.0000 mg | DELAYED_RELEASE_TABLET | Freq: Every day | ORAL | Status: DC
Start: 2018-03-06 — End: 2018-03-10
  Administered 2018-03-06 – 2018-03-10 (×6): 40 mg via ORAL
  Filled 2018-03-06 (×6): qty 1

## 2018-03-06 MED ORDER — METOCLOPRAMIDE HCL 5 MG/ML IJ SOLN
10.0000 mg | Freq: Once | INTRAMUSCULAR | Status: AC
  Administered 2018-03-06: 10 mg via INTRAVENOUS
  Filled 2018-03-06: qty 2

## 2018-03-06 MED ORDER — INSULIN DETEMIR 100 UNIT/ML ~~LOC~~ SOLN
20.0000 [IU] | Freq: Every day | SUBCUTANEOUS | Status: DC
  Filled 2018-03-06 (×5): qty 0.2

## 2018-03-06 MED ORDER — INSULIN ASPART 100 UNIT/ML ~~LOC~~ SOLN
5.0000 [IU] | Freq: Once | SUBCUTANEOUS | Status: AC
  Administered 2018-03-06: 5 [IU] via SUBCUTANEOUS

## 2018-03-06 MED ORDER — FLUCONAZOLE 100 MG PO TABS
100.0000 mg | ORAL_TABLET | Freq: Every day | ORAL | Status: DC
Start: 2018-03-07 — End: 2018-03-12
  Administered 2018-03-07 – 2018-03-12 (×6): 100 mg via ORAL
  Filled 2018-03-06 (×6): qty 1

## 2018-03-06 MED ORDER — INSULIN ASPART 100 UNIT/ML ~~LOC~~ SOLN
0.0000 [IU] | SUBCUTANEOUS | Status: DC
  Administered 2018-03-06: 7 [IU] via SUBCUTANEOUS
  Administered 2018-03-06: 20 [IU] via SUBCUTANEOUS
  Administered 2018-03-06: 11 [IU] via SUBCUTANEOUS
  Administered 2018-03-07: 4 [IU] via SUBCUTANEOUS
  Administered 2018-03-07 (×2): 11 [IU] via SUBCUTANEOUS
  Administered 2018-03-07: 4 [IU] via SUBCUTANEOUS
  Administered 2018-03-07: 11 [IU] via SUBCUTANEOUS
  Administered 2018-03-07: 7 [IU] via SUBCUTANEOUS
  Administered 2018-03-08: 15 [IU] via SUBCUTANEOUS
  Administered 2018-03-08: 11 [IU] via SUBCUTANEOUS
  Administered 2018-03-08: 15 [IU] via SUBCUTANEOUS
  Administered 2018-03-08 (×2): 11 [IU] via SUBCUTANEOUS
  Administered 2018-03-08 (×2): 4 [IU] via SUBCUTANEOUS
  Administered 2018-03-09: 7 [IU] via SUBCUTANEOUS
  Administered 2018-03-09: 11 [IU] via SUBCUTANEOUS
  Administered 2018-03-09 – 2018-03-10 (×2): 15 [IU] via SUBCUTANEOUS
  Administered 2018-03-10: 4 [IU] via SUBCUTANEOUS
  Administered 2018-03-10 (×3): 20 [IU] via SUBCUTANEOUS
  Administered 2018-03-11 (×3): 7 [IU] via SUBCUTANEOUS
  Administered 2018-03-11: 15 [IU] via SUBCUTANEOUS
  Administered 2018-03-11: 4 [IU] via SUBCUTANEOUS
  Administered 2018-03-11: 11 [IU] via SUBCUTANEOUS
  Administered 2018-03-12 (×2): 3 [IU] via SUBCUTANEOUS
  Administered 2018-03-12: 7 [IU] via SUBCUTANEOUS
  Administered 2018-03-13 (×2): 3 [IU] via SUBCUTANEOUS

## 2018-03-06 NOTE — Progress Notes (Addendum)
PROGRESS NOTE    Heather [name]  QMG:[id number] DOB: [birth year] DOA: 03/03/2018 PCP: [name], MD   Brief Narrative:   Heather [name]  is a 67 y.o. female, with history of chronic kidney disease stage III, diabetes mellitus, hiatal hernia came to hospital with complaints of vomiting and diarrhea since Thursday.    She has been admitted with acute pancreatitis as well as acute kidney injury suspected secondary to dehydration from vomiting and diarrhea.  Nephrology has been consulted for evaluation and management of AKI.   Assessment & Plan:   Active Problems:   AKI (acute kidney injury) ([place])   Protein-calorie malnutrition, severe   1. Acute pancreatitis-resolving.  She has had multiple prior episodes of this and does follow with gastroenterology in the outpatient setting.  Continue to advance diet which has been advanced from full liquid diet to soft this morning. 2. AKI secondary to dehydration.  Continue IV fluids per nephrology.  Appreciate further recommendations.  Avoid nephrotoxic agents and recheck renal panel in a.m. midline catheter placed yesterday for IV access. 3. Diabetes mellitus with mild to moderate hyperglycemia.  Continue sliding scale insulin and restart portion of home Levemir with increase to 20 units daily along with increase in sliding scale insulin. 4. Vaginal yeast infection.  Started on Diflucan per pharmacy.  Currently on oxybutynin for bladder spasms. 5. GERD.  Restarted on PPI.   DVT prophylaxis:Heparin Code Status: DNR Family Communication: None at bedside Disposition Plan: Continue diet advancement and monitoring of renal function on IV fluid per nephrology.   Consultants:   Nephrology  Procedures:   None  Antimicrobials:   None   Subjective: Patient seen and evaluated today with ongoing diarrhea which appears to have worsened.  She also has crampy abdominal pain as well as acid reflux symptoms.  Her creatinine appears to be  slowly improving however.  Objective: Vitals:   03/05/18 1422 03/05/18 1424 03/05/18 2300 03/06/18 0615  BP: (!) 126/47 (!) 123/44 (!) 141/51 (!) 116/55  Pulse: 67 65 76 74  Resp: 20  20 15   Temp: 98.2 F (36.8 C)  97.6 F (36.4 C) 98 F (36.7 C)  TempSrc:   Oral Oral  SpO2: 100%  97% 98%  Weight:      Height:        Intake/Output Summary (Last 24 hours) at 03/06/2018 1022 Last data filed at 03/06/2018 0900 Gross per 24 hour  Intake 2082 ml  Output 1700 ml  Net 382 ml   Filed Weights   03/03/18 2342 03/04/18 0800  Weight: 69.9 kg (154 lb) 64.8 kg (142 lb 13.7 oz)    Examination:  General exam: Appears calm and comfortable  Respiratory system: Clear to auscultation. Respiratory effort normal. Cardiovascular system: S1 & S2 heard, RRR. No JVD, murmurs, rubs, gallops or clicks. No pedal edema. Gastrointestinal system: Abdomen is nondistended, soft and nontender. No organomegaly or masses felt. Normal bowel sounds heard. Central nervous system: Alert and oriented. No focal neurological deficits. Extremities: Symmetric 5 x 5 power. Skin: No rashes, lesions or ulcers Psychiatry: Judgement and insight appear normal. Mood & affect appropriate.     Data Reviewed: I have personally reviewed following labs and imaging studies  CBC: Recent Labs  Lab 03/04/18 0002 03/04/18 1014 03/05/18 0550 03/06/18 0552  WBC 15.6* 10.9* 10.4 10.7*  HGB 14.8 11.5* 9.3* 9.4*  HCT 40.8 32.3* 26.6* 27.0*  MCV 82.9 82.4 83.9 85.7  PLT 367 289 189 326   Basic Metabolic Panel: Recent  Labs  Lab 03/04/18 0002 03/04/18 1014 03/05/18 0550 03/06/18 0552  NA 130*  --  134*  134* 135  K 2.4*  --  3.0*  3.0* 3.5  CL 89*  --  99*  99* 102  CO2 15*  --  24  23 22   GLUCOSE 255*  --  169*  170* 307*  BUN 96*  --  70*  71* 48*  CREATININE 9.24* 6.95* 3.91*  3.91* 2.06*  CALCIUM 8.3*  --  7.4*  7.5* 8.3*  MG 1.5*  --   --   --   PHOS  --   --  2.7  --    GFR: Estimated Creatinine  Clearance: 24.7 mL/min (A) (by C-G formula based on SCr of 2.06 mg/dL (H)). Liver Function Tests: Recent Labs  Lab 03/04/18 0002 03/05/18 0550 03/06/18 0552  AST 16 15 18   ALT 16 13* 16  ALKPHOS 105 68 69  BILITOT 0.8 0.9 0.4  PROT 9.1* 5.9* 6.0*  ALBUMIN 4.3 2.9*  2.9* 3.0*   Recent Labs  Lab 03/04/18 0002 03/05/18 0550  LIPASE 1,045* 1,151*   No results for input(s): AMMONIA in the last 168 hours. Coagulation Profile: No results for input(s): INR, PROTIME in the last 168 hours. Cardiac Enzymes: No results for input(s): CKTOTAL, CKMB, CKMBINDEX, TROPONINI in the last 168 hours. BNP (last 3 results) No results for input(s): PROBNP in the last 8760 hours. HbA1C: No results for input(s): HGBA1C in the last 72 hours. CBG: Recent Labs  Lab 03/05/18 0558 03/05/18 1142 03/05/18 1711 03/06/18 0038 03/06/18 0800  GLUCAP 168* 282* 218* 367* 314*   Lipid Profile: No results for input(s): CHOL, HDL, LDLCALC, TRIG, CHOLHDL, LDLDIRECT in the last 72 hours. Thyroid Function Tests: No results for input(s): TSH, T4TOTAL, FREET4, T3FREE, THYROIDAB in the last 72 hours. Anemia Panel: No results for input(s): VITAMINB12, FOLATE, FERRITIN, TIBC, IRON, RETICCTPCT in the last 72 hours. Sepsis Labs: Recent Labs  Lab 03/04/18 0031 03/04/18 0045 03/04/18 0315  LATICACIDVEN 2.49* 1.64 1.21    No results found for this or any previous visit (from the past 240 hour(s)).       Radiology Studies: US Renal  Result Date: 03/04/2018 CLINICAL DATA:  Acute renal insufficiency, diabetes, hyperlipidemia EXAM: RENAL / URINARY TRACT ULTRASOUND COMPLETE COMPARISON:  CT abdomen pelvis of 03/04/2017 FINDINGS: Right Kidney: Length: 10.2 cm. No hydronephrosis is seen. The echogenicity of the renal parenchyma is increased consistent with chronic renal medical disease. Left Kidney: Length: 10.2 cm. No hydronephrosis is seen. There is a small exophytic cyst from upper pole posteriorly measuring 1 cm  in diameter. The echogenicity of the renal parenchyma is increased consistent with chronic renal medical disease. Bladder: The urinary bladder is not well distended, and ureteral jets were not visualized. IMPRESSION: 1. No hydronephrosis. 2. Echogenic renal parenchyma bilaterally consistent with chronic renal medical disease. 3. Incidental note of echogenic liver parenchyma consistent with hepatic steatosis. Electronically Signed   By: Ivar Drape M.D.   On: 03/04/2018 11:39        Scheduled Meds: . allopurinol  50 mg Oral Daily  . aspirin EC  81 mg Oral Daily  . citalopram  20 mg Oral Daily  . feeding supplement (GLUCERNA SHAKE)  237 mL Oral TID BM  . fluconazole  200 mg Oral Once   Followed by  . [START ON 03/07/2018] fluconazole  100 mg Oral Daily  . gabapentin  100 mg Oral TID  . heparin  5,000  Units Subcutaneous Q8H  . insulin aspart  0-9 Units Subcutaneous TID WC  . insulin detemir  10 Units Subcutaneous Daily  . metoprolol tartrate  100 mg Oral BID  . multivitamin with minerals  1 tablet Oral Daily  . oxybutynin  2.5 mg Oral BID  . pantoprazole  40 mg Oral Daily  . sodium chloride flush  10-40 mL Intracatheter Q12H   Continuous Infusions: . sodium chloride 0.45 % with kcl 135 mL/hr at 03/06/18 0415     LOS: 2 days    Time spent: 30 minutes    Sloan Galentine Darleen Crocker, DO [facility] Pager [phone]  If 7PM-7AM, please contact night-coverage [email or web address] Password [email or web address] 03/06/2018, 10:22 AM

## 2018-03-06 NOTE — Progress Notes (Addendum)
Inpatient Diabetes Program Recommendations  AACE/ADA: New Consensus Statement on Inpatient Glycemic Control (2015)  Target Ranges:  Prepandial:   less than 140 mg/dL      Peak postprandial:   less than 180 mg/dL (1-2 hours)      Critically ill patients:  140 - 180 mg/dL  Results for Manuelito, Christinea A "[name]" (MRN [id number]) as of 03/06/2018 09:28  Ref. Range 03/05/2018 05:58 03/05/2018 11:42 03/05/2018 17:11 03/06/2018 00:38 03/06/2018 08:00  Glucose-Capillary Latest Ref Range: 65 - 99 mg/dL 168 (H)  Novolog 2 units 282 (H)  Novolog 5 units  Levemir 10 units @ 14:29 218 (H)  Novolog 3 units 367 (H)  Novolog 5 units 314 (H)  Novolog 7 units  Levemir 10 units    Review of Glycemic Control  Diabetes history: DM2 Outpatient Diabetes medications: Levemir 80 units daily, Humalog 21-40 units TID (per sliding scale), Victoza 1.8 mg daily Current orders for Inpatient glycemic control: Levemir 10 units daily, Novolog 0-9 units TID with meals  Inpatient Diabetes Program Recommendations: Insulin - Basal: Please consider increasing Levemir to 25 units daily (based on 64.8 kg x 0.4 units). If Levemir increased as recommended, please order Levemir 15 units x 1 for now (to total 25 units today since patient has already received Levemir 10 units this morning). Insulin-Correction: Please consider increasing Novolog correction to Moderate scale (0-15 units) and adding Novolog 0-5 units QHS for bedtime correction. Outpatient DM medications:  MD may want to consider discontinuing Victoza as an outpatient due to patient being admitted with acute pancreatitis and have patient follow up with Dr. Dorris Fetch.  Addendum 03/06/18@12 :55-Spoke with patient about diabetes and home regimen for diabetes control. Patient reports that she is followed by Dr. Dorris Fetch for diabetes management but she reports that she has not been to see Dr. Dorris Fetch in a while. Last office visit note with Dr. Dorris Fetch in the chart was on 03/07/2017.  Patient  reports that she had an appointment scheduled this week but she had to cancel her appointment because she felt so sick.  Patient states that [name] has been refilling all her DM medications. Patient reports that she has been taking Levemir 80 units QHS, Humalog 21-40 units TID with meals for meal coverage (if glucose is over 120 mg/dl), and Victoza 1.8 mg daily before supper for DM control. Patient reports that no changes were made with DM medications at last office visit with [name] (PCP). Patient states that she has been on Victoza for several years and her dose was increased about 1 year ago. Patient also reported that she has had pancreatitis in the past before starting on Victoza. Patient reports that she checks her glucose at home and it is always in the 200-300's mg/dl.  Inquired about prior A1C and patient reports that her last A1C was 9% and that is when [name] wanted her to go back to see Dr. Dorris Fetch so she made the appointment but had to cancel it due to being sick.  does not recall his last A1C value.  Discussed glucose and A1C goals. Discussed importance of checking CBGs and maintaining good CBG control to prevent long-term and short-term complications. Explained how hyperglycemia leads to damage within blood vessels which lead to the common complications seen with uncontrolled diabetes. Stressed to the patient the importance of improving glycemic control to prevent further complications from uncontrolled diabetes. Discussed impact of nutrition, exercise, stress, sickness, and medications on diabetes control. Encouraged patient to call and reschedule appointment with  Dr. Dorris Fetch and to be sure she takes her glucometer with her to follow up appointments with Dr. Dorris Fetch. Patient verbalized understanding of information discussed and she states that she has no further questions at this time related to diabetes.  Thanks, Barnie Alderman, RN, MSN, CDE Diabetes Coordinator Inpatient Diabetes  Program [phone] (Team Pager from 8am to 5pm)

## 2018-03-06 NOTE — Progress Notes (Signed)
Pharmacy Antibiotic Note  Heather [name] is a 67 y.o. female admitted on 03/03/2018 with urinary candidiasis.  Pharmacy has been consulted for diflucan dosing.  Plan: Diflucan 200mg  po today, then 100mg  po daily F/u clinical progress and any cxs Monitor V/S, labs  Height: 5\' 3"  (160 cm) Weight: 142 lb 13.7 oz (64.8 kg) IBW/kg (Calculated) : 52.4  Temp (24hrs), Avg:97.9 F (36.6 C), Min:97.6 F (36.4 C), Max:98.2 F (36.8 C)  Recent Labs  Lab 03/04/18 0002 03/04/18 0031 03/04/18 0045 03/04/18 0315 03/04/18 1014 03/05/18 0550 03/06/18 0552  WBC 15.6*  --   --   --  10.9* 10.4 10.7*  CREATININE 9.24*  --   --   --  6.95* 3.91*  3.91* 2.06*  LATICACIDVEN  --  2.49* 1.64 1.21  --   --   --     Estimated Creatinine Clearance: 24.7 mL/min (A) (by C-G formula based on SCr of 2.06 mg/dL (H)).    Allergies  Allergen Reactions  . Duloxetine Hcl Swelling  . Fluocinolone Swelling  . Acetaminophen Itching and Swelling    Pt states "some swelling"  . Amlodipine Besylate Swelling    Swelling of feet, fluid retention  . Ciprofloxacin Nausea And Vomiting and Swelling    Swelling of face, jaw, and lips  . Hydrocodone-Acetaminophen Itching  . Metformin Other (See Comments)    REACTION: GI UPSET  . Nsaids Other (See Comments)    "HAS BLEEDING ULCERS"  . Quinolones Swelling  . Sulfamethoxazole Swelling    Swelling of feet, legs  . Sulfonamide Derivatives Swelling  . Valsartan Swelling  . Cozaar [Losartan] Swelling and Rash  . Lisinopril Swelling and Rash    Oral swelling, and red streaks on arms/stomach    Antimicrobials this admission: Fluconazole 5/9>>   Dose adjustments this admission: n/a  Microbiology results: No cxs 5/8UA cloudy, with large leukocytes and budding yeast  Thank you for allowing pharmacy to be a part of this patient's care.  Isac Sarna, BS Pharm D, BCPS Clinical Pharmacist Pager [phone] 03/06/2018 10:36 AM

## 2018-03-06 NOTE — Progress Notes (Signed)
Subjective: Interval History: has complaints some abdominal pain.  Patient also states that she has some urgency and frequency.  Denies any fever chills or sweating..  Objective: Vital signs in last 24 hours: Temp:  [97.6 F (36.4 C)-98.2 F (36.8 C)] 98 F (36.7 C) (05/09 0615) Pulse Rate:  [65-76] 74 (05/09 0615) Resp:  [15-20] 15 (05/09 0615) BP: (116-141)/(44-55) 116/55 (05/09 0615) SpO2:  [97 %-100 %] 98 % (05/09 0615) Weight change:   Intake/Output from previous day: 05/08 0701 - 05/09 0700 In: 1722 [P.O.:480; I.V.:1242] Out: 1600 [Urine:1600] Intake/Output this shift: Total I/O In: -  Out: 600 [Urine:600]  General appearance: alert, cooperative and no distress Resp: clear to auscultation bilaterally GI: Soft and nontender Extremities: No edema  Lab Results: Recent Labs    03/05/18 0550 03/06/18 0552  WBC 10.4 10.7*  HGB 9.3* 9.4*  HCT 26.6* 27.0*  PLT 189 200   BMET:  Recent Labs    03/05/18 0550 03/06/18 0552  NA 134*  134* 135  K 3.0*  3.0* 3.5  CL 99*  99* 102  CO2 24  23 22   GLUCOSE 169*  170* 307*  BUN 70*  71* 48*  CREATININE 3.91*  3.91* 2.06*  CALCIUM 7.4*  7.5* 8.3*   No results for input(s): PTH in the last 72 hours. Iron Studies: No results for input(s): IRON, TIBC, TRANSFERRIN, FERRITIN in the last 72 hours.  Studies/Results: US Renal  Result Date: 03/04/2018 CLINICAL DATA:  Acute renal insufficiency, diabetes, hyperlipidemia EXAM: RENAL / URINARY TRACT ULTRASOUND COMPLETE COMPARISON:  CT abdomen pelvis of 03/04/2017 FINDINGS: Right Kidney: Length: 10.2 cm. No hydronephrosis is seen. The echogenicity of the renal parenchyma is increased consistent with chronic renal medical disease. Left Kidney: Length: 10.2 cm. No hydronephrosis is seen. There is a small exophytic cyst from upper pole posteriorly measuring 1 cm in diameter. The echogenicity of the renal parenchyma is increased consistent with chronic renal medical disease. Bladder:  The urinary bladder is not well distended, and ureteral jets were not visualized. IMPRESSION: 1. No hydronephrosis. 2. Echogenic renal parenchyma bilaterally consistent with chronic renal medical disease. 3. Incidental note of echogenic liver parenchyma consistent with hepatic steatosis. Electronically Signed   By: Ivar Drape M.D.   On: 03/04/2018 11:39    I have reviewed the patient's current medications.  Assessment/Plan: 1] acute kidney injury superimposed on chronic.  Presently her renal function has progressively improved.  She is nonoliguric and she has 1600 cc of urine output.  Presently her creatinine seems to have returned to his baseline. 2] hypokalemia: Her potassium 3.5 has improved 3] bone mineral disorder: Her calcium and phosphorus is range 4] anemia: Her hemoglobin is below target goal 5] hypertension: Her blood pressure is reasonably controlled 6] history of gastroenteritis.  Patient is still seems to have some diarrhea but the frequency has decreased. 7] urgency and frequency: At this moment etiology not clear.  Urine culture is pending. Plan: We will continue his present management 2] we will check a renal panel    LOS: 2 days   Heather [name] S 03/06/2018,9:40 AM

## 2018-03-07 LAB — BASIC METABOLIC PANEL
ANION GAP: 8 (ref 5–15)
BUN: 35 mg/dL — ABNORMAL HIGH (ref 6–20)
CO2: 20 mmol/L — ABNORMAL LOW (ref 22–32)
Calcium: 8.8 mg/dL — ABNORMAL LOW (ref 8.9–10.3)
Chloride: 107 mmol/L (ref 101–111)
Creatinine, Ser: 1.55 mg/dL — ABNORMAL HIGH (ref 0.44–1.00)
GFR calc Af Amer: 39 mL/min — ABNORMAL LOW (ref >60)
GFR, EST NON AFRICAN AMERICAN: 34 mL/min — AB (ref >60)
GLUCOSE: 205 mg/dL — AB (ref 65–99)
POTASSIUM: 4.2 mmol/L (ref 3.5–5.1)
Sodium: 135 mmol/L (ref 135–145)

## 2018-03-07 LAB — GLUCOSE, CAPILLARY
GLUCOSE-CAPILLARY: 180 mg/dL — AB (ref 65–99)
GLUCOSE-CAPILLARY: 204 mg/dL — AB (ref 65–99)
Glucose-Capillary: 194 mg/dL — ABNORMAL HIGH (ref 65–99)
Glucose-Capillary: 256 mg/dL — ABNORMAL HIGH (ref 65–99)
Glucose-Capillary: 283 mg/dL — ABNORMAL HIGH (ref 65–99)
Glucose-Capillary: 289 mg/dL — ABNORMAL HIGH (ref 65–99)

## 2018-03-07 LAB — CBC
HEMATOCRIT: 27.9 % — AB (ref 36.0–46.0)
Hemoglobin: 9.5 g/dL — ABNORMAL LOW (ref 12.0–15.0)
MCH: 29.7 pg (ref 26.0–34.0)
MCHC: 34.1 g/dL (ref 30.0–36.0)
MCV: 87.2 fL (ref 78.0–100.0)
Platelets: 183 10*3/uL (ref 150–400)
RBC: 3.2 MIL/uL — AB (ref 3.87–5.11)
RDW: 14.5 % (ref 11.5–15.5)
WBC: 10.6 10*3/uL — AB (ref 4.0–10.5)

## 2018-03-07 LAB — RENAL FUNCTION PANEL
Albumin: 3.1 g/dL — ABNORMAL LOW (ref 3.5–5.0)
Anion gap: 8 (ref 5–15)
BUN: 35 mg/dL — ABNORMAL HIGH (ref 6–20)
CALCIUM: 8.8 mg/dL — AB (ref 8.9–10.3)
CO2: 20 mmol/L — AB (ref 22–32)
CREATININE: 1.61 mg/dL — AB (ref 0.44–1.00)
Chloride: 108 mmol/L (ref 101–111)
GFR calc non Af Amer: 33 mL/min — ABNORMAL LOW (ref >60)
GFR, EST AFRICAN AMERICAN: 38 mL/min — AB (ref >60)
GLUCOSE: 207 mg/dL — AB (ref 65–99)
Phosphorus: 2 mg/dL — ABNORMAL LOW (ref 2.5–4.6)
Potassium: 4.2 mmol/L (ref 3.5–5.1)
SODIUM: 136 mmol/L (ref 135–145)

## 2018-03-07 MED ORDER — CYANOCOBALAMIN 1000 MCG/ML IJ SOLN
1000.0000 ug | Freq: Once | INTRAMUSCULAR | Status: AC
  Administered 2018-03-07: 1000 ug via INTRAMUSCULAR
  Filled 2018-03-07: qty 1

## 2018-03-07 MED ORDER — DICYCLOMINE HCL 10 MG PO CAPS
10.0000 mg | ORAL_CAPSULE | Freq: Three times a day (TID) | ORAL | Status: DC
  Administered 2018-03-07 – 2018-03-11 (×12): 10 mg via ORAL
  Filled 2018-03-07 (×13): qty 1

## 2018-03-07 MED ORDER — LOPERAMIDE HCL 2 MG PO CAPS
2.0000 mg | ORAL_CAPSULE | ORAL | Status: DC | PRN
  Administered 2018-03-07: 2 mg via ORAL
  Filled 2018-03-07: qty 1

## 2018-03-07 MED ORDER — PROMETHAZINE HCL 25 MG/ML IJ SOLN
12.5000 mg | Freq: Four times a day (QID) | INTRAMUSCULAR | Status: DC | PRN
  Administered 2018-03-07 – 2018-03-08 (×2): 12.5 mg via INTRAVENOUS
  Filled 2018-03-07 (×3): qty 1

## 2018-03-07 MED ORDER — INSULIN DETEMIR 100 UNIT/ML ~~LOC~~ SOLN
30.0000 [IU] | Freq: Every day | SUBCUTANEOUS | Status: DC
  Filled 2018-03-07 (×3): qty 0.3

## 2018-03-07 MED ORDER — SODIUM CHLORIDE 0.9 % IV SOLN
2.0000 g | INTRAVENOUS | Status: DC
  Administered 2018-03-07 – 2018-03-11 (×5): 2 g via INTRAVENOUS
  Filled 2018-03-07 (×3): qty 2
  Filled 2018-03-07: qty 20
  Filled 2018-03-07 (×2): qty 2

## 2018-03-07 NOTE — Progress Notes (Signed)
Subjective: Interval History: Still she complains of abdominal pain.  She states that her diarrhea has come back and she had about 8-9 loose stool within the last 24 hours.  She has episode of nausea but no vomiting.  She denies any difficulty breathing.  Objective: Vital signs in last 24 hours: Temp:  [97.7 F (36.5 C)-98.9 F (37.2 C)] 97.7 F (36.5 C) (05/10 0441) Pulse Rate:  [74-81] 75 (05/10 0441) Resp:  [14-18] 14 (05/10 0441) BP: (124-134)/(47-60) 125/60 (05/10 0441) SpO2:  [98 %-99 %] 98 % (05/10 0441) Weight change:   Intake/Output from previous day: 05/09 0701 - 05/10 0700 In: 5064.8 [P.O.:360; I.V.:4704.8] Out: 1500 [Urine:1500] Intake/Output this shift: No intake/output data recorded.  Generally patient is alert and in no apparent distress Chest is clear to auscultation Heart exam revealed regular rate and rhythm no murmur Abdomen: Soft, slight tenderness, no rebound tenderness and positive bowel sounds Extremities no edema  Lab Results: Recent Labs    03/06/18 0552 03/07/18 0536  WBC 10.7* 10.6*  HGB 9.4* 9.5*  HCT 27.0* 27.9*  PLT 200 183   BMET:  Recent Labs    03/06/18 0552 03/07/18 0536  NA 135 136  135  K 3.5 4.2  4.2  CL 102 108  107  CO2 22 20*  20*  GLUCOSE 307* 207*  205*  BUN 48* 35*  35*  CREATININE 2.06* 1.61*  1.55*  CALCIUM 8.3* 8.8*  8.8*   No results for input(s): PTH in the last 72 hours. Iron Studies: No results for input(s): IRON, TIBC, TRANSFERRIN, FERRITIN in the last 72 hours.  Studies/Results: No results found.  I have reviewed the patient's current medications.  Assessment/Plan: 1] acute kidney injury superimposed on chronic.  Patient remains nonoliguric with 1500 cc of urine output.  Renal function has returned to her baseline. 2] hypokalemia:  patient is on potassium supplement and her potassium is normal. 3] bone mineral disorder: Her calcium is range but her phosphorus is low  4] anemia: Her hemoglobin  is below target goal bu stable.t  5] hypertension: Her blood pressure is reasonably controlled 6] history of gastroenteritis.  Patient is still seems to have some diarrhea but the frequency has decreased.  Plan: We will decrease IV fluid to 75 cc/h 2] we will check a renal panel    LOS: 3 days   Dariyon Urquilla S 03/07/2018,8:16 AM

## 2018-03-07 NOTE — Care Management Important Message (Signed)
Important Message  Patient Details  Name: Heather [name] MRN: [id number] Date of Birth: 1951/05/08   Medicare Important Message Given:  Yes    John Williamsen, Chauncey Reading, RN 03/07/2018, 12:27 PM

## 2018-03-07 NOTE — Progress Notes (Signed)
Pharmacy Antibiotic Note  Heather [name] is a 67 y.o. female admitted on 5/6/2019with urinary candidiasis and Salmonella gastroenteritis   Pharmacy has been consulted for diflucan and rocephin dosing.  Plan: Continue Diflucan 200mg  po today, then 100mg  po daily F/u clinical progress and any cxs Monitor V/S, labs Rocephin 2gm IV every 24 hours. Dose stable for age, weight, renal function and indication. No pharmacokinetic monitoring needed. Sign off.    Height: 5\' 3"  (160 cm) Weight: 142 lb 13.7 oz (64.8 kg) IBW/kg (Calculated) : 52.4  Temp (24hrs), Avg:98.2 F (36.8 C), Min:97.7 F (36.5 C), Max:98.9 F (37.2 C)  Recent Labs  Lab 03/04/18 0002 03/04/18 0031 03/04/18 0045 03/04/18 0315 03/04/18 1014 03/05/18 0550 03/06/18 0552 03/07/18 0536  WBC 15.6*  --   --   --  10.9* 10.4 10.7* 10.6*  CREATININE 9.24*  --   --   --  6.95* 3.91*  3.91* 2.06* 1.61*  1.55*  LATICACIDVEN  --  2.49* 1.64 1.21  --   --   --   --     Estimated Creatinine Clearance: 31.6 mL/min (A) (by C-G formula based on SCr of 1.61 mg/dL (H)).    Allergies  Allergen Reactions  . Duloxetine Hcl Swelling  . Fluocinolone Swelling  . Acetaminophen Itching and Swelling    Pt states "some swelling"  . Amlodipine Besylate Swelling    Swelling of feet, fluid retention  . Ciprofloxacin Nausea And Vomiting and Swelling    Swelling of face, jaw, and lips  . Hydrocodone-Acetaminophen Itching  . Metformin Other (See Comments)    REACTION: GI UPSET  . Nsaids Other (See Comments)    "HAS BLEEDING ULCERS"  . Quinolones Swelling  . Sulfamethoxazole Swelling    Swelling of feet, legs  . Sulfonamide Derivatives Swelling  . Valsartan Swelling  . Cozaar [Losartan] Swelling and Rash  . Lisinopril Swelling and Rash    Oral swelling, and red streaks on arms/stomach    Antimicrobials this admission: Fluconazole 5/9>>  Rocephin 5/10 >>  Dose adjustments this admission: n/a  Microbiology  results: No cxs 5/8UA cloudy, with large leukocytes and budding yeast  Thank you for allowing pharmacy to be a part of this patient's care.  Pricilla Larsson [facility]  Pager [phone] 03/07/2018 10:48 AM

## 2018-03-07 NOTE — Progress Notes (Signed)
PROGRESS NOTE    Heather [name]  WRU:[id number] DOB: October 02, 1951 DOA: 03/03/2018 PCP: [name], MD   Brief Narrative:   Heather [name]  is a 67 y.o. female, with history of chronic kidney disease stage III, diabetes mellitus, hiatal hernia came to hospital with complaints of vomiting and diarrhea since Thursday.    She has been admitted with acute pancreatitis as well as acute kidney injury suspected secondary to dehydration from vomiting and diarrhea.  Nephrology has been consulted for evaluation and management of AKI.   Assessment & Plan:   Active Problems:   AKI (acute kidney injury) ([place])   Protein-calorie malnutrition, severe   1. Acute pancreatitis-resolving.  She has had multiple prior episodes of this and does follow with gastroenterology in the outpatient setting.  Continue soft diet. 2. Salmonella gastroenteritis. Ongoing diarrhea with cramping. Will order Rocephin for the severity along with Immodium and Bentyl for cramps. Continue IVF per Nephrology. 3. AKI secondary to dehydration-now at baseline.  Continue IV fluids per nephrology-rate reduced today.  Appreciate further recommendations.  Avoid nephrotoxic agents and recheck renal panel in a.m. midline catheter placed yesterday for IV access. 4. Diabetes mellitus with mild to moderate hyperglycemia.  Continue sliding scale insulin and restart portion of home Levemir with increase to 30 units daily today.  5. Vaginal yeast infection.  Started on Diflucan per pharmacy.  Currently on oxybutynin for bladder spasms. 6. GERD.  Restarted on PPI.   DVT prophylaxis:Heparin Code Status: DNR Family Communication: None at bedside Disposition Plan: Continue diet advancement and monitoring of renal function on IV fluid per nephrology.   Consultants:   Nephrology  Procedures:   None  Antimicrobials:   None   Subjective: Patient seen and evaluated today with ongoing diarrhea which appears to have worsened.  She  also has crampy abdominal pain as well as acid reflux symptoms.  Her creatinine appears to be slowly improving however.  Objective: Vitals:   03/06/18 1517 03/06/18 2018 03/06/18 2222 03/07/18 0441  BP: (!) 124/47  (!) 134/49 125/60  Pulse: 74  81 75  Resp: 18  14 14   Temp: 98.9 F (37.2 C)  97.9 F (36.6 C) 97.7 F (36.5 C)  TempSrc: Oral  Oral Oral  SpO2: 99% 98% 98% 98%  Weight:      Height:        Intake/Output Summary (Last 24 hours) at 03/07/2018 1000 Last data filed at 03/07/2018 0629 Gross per 24 hour  Intake 4704.75 ml  Output 900 ml  Net 3804.75 ml   Filed Weights   03/03/18 2342 03/04/18 0800  Weight: 69.9 kg (154 lb) 64.8 kg (142 lb 13.7 oz)    Examination:  General exam: Appears calm and comfortable  Respiratory system: Clear to auscultation. Respiratory effort normal. Cardiovascular system: S1 & S2 heard, RRR. No JVD, murmurs, rubs, gallops or clicks. No pedal edema. Gastrointestinal system: Abdomen is nondistended, soft and nontender. No organomegaly or masses felt. Normal bowel sounds heard. Central nervous system: Alert and oriented. No focal neurological deficits. Extremities: Symmetric 5 x 5 power. Skin: No rashes, lesions or ulcers Psychiatry: Judgement and insight appear normal. Mood & affect appropriate.     Data Reviewed: I have personally reviewed following labs and imaging studies  CBC: Recent Labs  Lab 03/04/18 0002 03/04/18 1014 03/05/18 0550 03/06/18 0552 03/07/18 0536  WBC 15.6* 10.9* 10.4 10.7* 10.6*  HGB 14.8 11.5* 9.3* 9.4* 9.5*  HCT 40.8 32.3* 26.6* 27.0* 27.9*  MCV 82.9 82.4 83.9  85.7 87.2  PLT 367 289 189 200 540   Basic Metabolic Panel: Recent Labs  Lab 03/04/18 0002 03/04/18 1014 03/05/18 0550 03/06/18 0552 03/07/18 0536  NA 130*  --  134*  134* 135 136  135  K 2.4*  --  3.0*  3.0* 3.5 4.2  4.2  CL 89*  --  99*  99* 102 108  107  CO2 15*  --  24  23 22  20*  20*  GLUCOSE 255*  --  169*  170* 307* 207*   205*  BUN 96*  --  70*  71* 48* 35*  35*  CREATININE 9.24* 6.95* 3.91*  3.91* 2.06* 1.61*  1.55*  CALCIUM 8.3*  --  7.4*  7.5* 8.3* 8.8*  8.8*  MG 1.5*  --   --   --   --   PHOS  --   --  2.7  --  2.0*   GFR: Estimated Creatinine Clearance: 31.6 mL/min (A) (by C-G formula based on SCr of 1.61 mg/dL (H)). Liver Function Tests: Recent Labs  Lab 03/04/18 0002 03/05/18 0550 03/06/18 0552 03/07/18 0536  AST 16 15 18   --   ALT 16 13* 16  --   ALKPHOS 105 68 69  --   BILITOT 0.8 0.9 0.4  --   PROT 9.1* 5.9* 6.0*  --   ALBUMIN 4.3 2.9*  2.9* 3.0* 3.1*   Recent Labs  Lab 03/04/18 0002 03/05/18 0550  LIPASE 1,045* 1,151*   No results for input(s): AMMONIA in the last 168 hours. Coagulation Profile: No results for input(s): INR, PROTIME in the last 168 hours. Cardiac Enzymes: No results for input(s): CKTOTAL, CKMB, CKMBINDEX, TROPONINI in the last 168 hours. BNP (last 3 results) No results for input(s): PROBNP in the last 8760 hours. HbA1C: No results for input(s): HGBA1C in the last 72 hours. CBG: Recent Labs  Lab 03/06/18 1655 03/06/18 2036 03/07/18 0003 03/07/18 0439 03/07/18 0747  GLUCAP 351* 247* 194* 180* 204*   Lipid Profile: No results for input(s): CHOL, HDL, LDLCALC, TRIG, CHOLHDL, LDLDIRECT in the last 72 hours. Thyroid Function Tests: No results for input(s): TSH, T4TOTAL, FREET4, T3FREE, THYROIDAB in the last 72 hours. Anemia Panel: No results for input(s): VITAMINB12, FOLATE, FERRITIN, TIBC, IRON, RETICCTPCT in the last 72 hours. Sepsis Labs: Recent Labs  Lab 03/04/18 0031 03/04/18 0045 03/04/18 0315  LATICACIDVEN 2.49* 1.64 1.21    Recent Results (from the past 240 hour(s))  Gastrointestinal Panel by PCR , Stool     Status: Abnormal   Collection Time: 03/04/18  4:11 PM  Result Value Ref Range Status   Campylobacter species NOT DETECTED NOT DETECTED Final   Plesimonas shigelloides NOT DETECTED NOT DETECTED Final   Salmonella species  DETECTED (A) NOT DETECTED Final    Comment: RESULT CALLED TO, READ BACK BY AND VERIFIED WITH: BRITTANY FOLEY 03/06/18 1518 KLW    Yersinia enterocolitica NOT DETECTED NOT DETECTED Final   Vibrio species NOT DETECTED NOT DETECTED Final   Vibrio cholerae NOT DETECTED NOT DETECTED Final   Enteroaggregative E coli (EAEC) NOT DETECTED NOT DETECTED Final   Enteropathogenic E coli (EPEC) NOT DETECTED NOT DETECTED Final   Enterotoxigenic E coli (ETEC) NOT DETECTED NOT DETECTED Final   Shiga like toxin producing E coli (STEC) NOT DETECTED NOT DETECTED Final   Shigella/Enteroinvasive E coli (EIEC) NOT DETECTED NOT DETECTED Final   Cryptosporidium NOT DETECTED NOT DETECTED Final   Cyclospora cayetanensis NOT DETECTED NOT DETECTED Final  Entamoeba histolytica NOT DETECTED NOT DETECTED Final   Giardia lamblia NOT DETECTED NOT DETECTED Final   Adenovirus F40/41 NOT DETECTED NOT DETECTED Final   Astrovirus NOT DETECTED NOT DETECTED Final   Norovirus GI/GII NOT DETECTED NOT DETECTED Final   Rotavirus A NOT DETECTED NOT DETECTED Final   Sapovirus (I, II, IV, and V) NOT DETECTED NOT DETECTED Final    Comment: Performed at [facility], [address] [place], [place] [address]         Radiology Studies: No results found.      Scheduled Meds: . allopurinol  50 mg Oral Daily  . aspirin EC  81 mg Oral Daily  . citalopram  20 mg Oral Daily  . dicyclomine  10 mg Oral TID AC  . feeding supplement (GLUCERNA SHAKE)  237 mL Oral TID BM  . fluconazole  100 mg Oral Daily  . gabapentin  100 mg Oral TID  . heparin  5,000 Units Subcutaneous Q8H  . insulin aspart  0-20 Units Subcutaneous Q4H  . insulin detemir  20 Units Subcutaneous Daily  . metoprolol tartrate  100 mg Oral BID  . multivitamin with minerals  1 tablet Oral Daily  . oxybutynin  2.5 mg Oral BID  . pantoprazole  40 mg Oral Daily  . sodium chloride flush  10-40 mL Intracatheter Q12H   Continuous Infusions: . sodium chloride  0.45 % with kcl 75 mL/hr at 03/07/18 0846     LOS: 3 days    Time spent: 30 minutes    [place] Darleen Crocker, DO [facility] Pager [phone]  If 7PM-7AM, please contact night-coverage [email or web address] Password [email or web address] 03/07/2018, 10:00 AM

## 2018-03-08 LAB — RENAL FUNCTION PANEL
ALBUMIN: 3 g/dL — AB (ref 3.5–5.0)
ANION GAP: 8 (ref 5–15)
BUN: 27 mg/dL — ABNORMAL HIGH (ref 6–20)
CHLORIDE: 112 mmol/L — AB (ref 101–111)
CO2: 18 mmol/L — ABNORMAL LOW (ref 22–32)
Calcium: 9 mg/dL (ref 8.9–10.3)
Creatinine, Ser: 1.45 mg/dL — ABNORMAL HIGH (ref 0.44–1.00)
GFR calc Af Amer: 43 mL/min — ABNORMAL LOW (ref >60)
GFR, EST NON AFRICAN AMERICAN: 37 mL/min — AB (ref >60)
Glucose, Bld: 195 mg/dL — ABNORMAL HIGH (ref 65–99)
PHOSPHORUS: 1.8 mg/dL — AB (ref 2.5–4.6)
Potassium: 5 mmol/L (ref 3.5–5.1)
Sodium: 138 mmol/L (ref 135–145)

## 2018-03-08 LAB — GLUCOSE, CAPILLARY
GLUCOSE-CAPILLARY: 329 mg/dL — AB (ref 65–99)
GLUCOSE-CAPILLARY: 334 mg/dL — AB (ref 65–99)
Glucose-Capillary: 184 mg/dL — ABNORMAL HIGH (ref 65–99)
Glucose-Capillary: 196 mg/dL — ABNORMAL HIGH (ref 65–99)
Glucose-Capillary: 256 mg/dL — ABNORMAL HIGH (ref 65–99)
Glucose-Capillary: 283 mg/dL — ABNORMAL HIGH (ref 65–99)
Glucose-Capillary: 283 mg/dL — ABNORMAL HIGH (ref 65–99)
Glucose-Capillary: 317 mg/dL — ABNORMAL HIGH (ref 65–99)

## 2018-03-08 LAB — CBC
HEMATOCRIT: 27.1 % — AB (ref 36.0–46.0)
HEMOGLOBIN: 9.2 g/dL — AB (ref 12.0–15.0)
MCH: 30.4 pg (ref 26.0–34.0)
MCHC: 33.9 g/dL (ref 30.0–36.0)
MCV: 89.4 fL (ref 78.0–100.0)
Platelets: 178 10*3/uL (ref 150–400)
RBC: 3.03 MIL/uL — AB (ref 3.87–5.11)
RDW: 14.9 % (ref 11.5–15.5)
WBC: 9.6 10*3/uL (ref 4.0–10.5)

## 2018-03-08 MED ORDER — INSULIN DETEMIR 100 UNIT/ML ~~LOC~~ SOLN
40.0000 [IU] | Freq: Every day | SUBCUTANEOUS | Status: DC
  Administered 2018-03-09: 40 [IU] via SUBCUTANEOUS
  Filled 2018-03-08 (×2): qty 0.4

## 2018-03-08 MED ORDER — ALBUTEROL SULFATE (2.5 MG/3ML) 0.083% IN NEBU
2.5000 mg | INHALATION_SOLUTION | Freq: Four times a day (QID) | RESPIRATORY_TRACT | Status: DC | PRN
  Administered 2018-03-08 – 2018-03-10 (×4): 2.5 mg via RESPIRATORY_TRACT
  Filled 2018-03-08 (×4): qty 3

## 2018-03-08 MED ORDER — COLESEVELAM HCL 625 MG PO TABS
625.0000 mg | ORAL_TABLET | Freq: Two times a day (BID) | ORAL | Status: DC
  Administered 2018-03-08 – 2018-03-10 (×4): 625 mg via ORAL
  Filled 2018-03-08 (×8): qty 1

## 2018-03-08 NOTE — Progress Notes (Signed)
Subjective: Interval History: Patient is feeling much better.  She does not have any nausea or vomiting.  States she has some abdominal pain and diarrhea raising up. Objective: Vital signs in last 24 hours: Temp:  [97.5 F (36.4 C)-98.9 F (37.2 C)] 97.8 F (36.6 C) (05/11 0450) Pulse Rate:  [76-79] 78 (05/11 0450) Resp:  [15-18] 18 (05/11 0450) BP: (122-152)/(56-62) 122/60 (05/11 0450) SpO2:  [94 %-100 %] 100 % (05/11 0540) Weight change:   Intake/Output from previous day: 05/10 0701 - 05/11 0700 In: 2070.8 [P.O.:970; I.V.:1000.8; IV Piggyback:100] Out: 1200 [Urine:1200] Intake/Output this shift: Total I/O In: 240 [P.O.:240] Out: -   Generally patient is alert and in no apparent distress Chest is clear to auscultation Heart exam revealed regular rate and rhythm no murmur Abdomen: Soft, slight tenderness, no rebound tenderness and positive bowel sounds Extremities no edema  Lab Results: Recent Labs    03/07/18 0536 03/08/18 0645  WBC 10.6* 9.6  HGB 9.5* 9.2*  HCT 27.9* 27.1*  PLT 183 178   BMET:  Recent Labs    03/07/18 0536 03/08/18 0645  NA 136  135 138  K 4.2  4.2 5.0  CL 108  107 112*  CO2 20*  20* 18*  GLUCOSE 207*  205* 195*  BUN 35*  35* 27*  CREATININE 1.61*  1.55* 1.45*  CALCIUM 8.8*  8.8* 9.0   No results for input(s): PTH in the last 72 hours. Iron Studies: No results for input(s): IRON, TIBC, TRANSFERRIN, FERRITIN in the last 72 hours.  Studies/Results: No results found.  I have reviewed the patient's current medications.  Assessment/Plan: 1] acute kidney injury superimposed on chronic.  Renal function continue to improve. 2] hypokalemia:  patient is getting potassium with IV fluid.  Her potassium is good. 3] bone mineral disorder: Her calcium is range but her phosphorus is low  4] anemia: Her hemoglobin is below target goal bu stable.  5] hypertension: Her blood pressure is reasonably controlled 6] history of gastroenteritis.   Improving. 7] history of irritable bowel syndrome Plan: We will DC IV fluid 2] we will check a renal panel  3] we will see patient in 4 weeks as an outpatient.   LOS: 4 days   Zed Wanninger S 03/08/2018,9:29 AM

## 2018-03-08 NOTE — Progress Notes (Signed)
PROGRESS NOTE    Heather [name]  GUY:[id number] DOB: 18-Jun-1951 DOA: 03/03/2018 PCP: [name], MD   Brief Narrative:   Heather [name]  is a 67 y.o. female, with history of chronic kidney disease stage III, diabetes mellitus, hiatal hernia came to hospital with complaints of vomiting and diarrhea since Thursday.    She has been admitted with acute pancreatitis as well as acute kidney injury suspected secondary to dehydration from vomiting and diarrhea.  Nephrology has been consulted for evaluation and management of AKI which has now resolved.  He continues to have ongoing diarrhea and crampy abdominal pain.   Assessment & Plan:   Active Problems:   AKI (acute kidney injury) ([place])   Protein-calorie malnutrition, severe   1. Acute pancreatitis-resolved.  She has had multiple prior episodes of this and does follow with gastroenterology in the outpatient setting.  She is now tolerating soft diet with no further nausea or vomiting. 2. Salmonella gastroenteritis-slowly improving.  This appears to be accompanied with IBS flare which is prolonging her symptoms.  She has ongoing diarrhea with cramping that is beginning to improve.  Continue Rocephin for the severity along with Immodium and Bentyl for cramps.  IV fluid discontinued per nephrology. 3. AKI secondary to dehydration-now at baseline.  IV fluid discontinued per nephrology.  Repeat renal panel in a.m. 4. Diabetes mellitus with mild to moderate hyperglycemia.  Continue sliding scale insulin and restart portion of home Levemir with increase to 40 units daily today.  5. Vaginal yeast infection.  Started on Diflucan per pharmacy.  Currently on oxybutynin for bladder spasms. 6. GERD.  Restarted on PPI.   DVT prophylaxis:Heparin Code Status: DNR Family Communication: None at bedside Disposition Plan: Continue diet advancement and monitoring of renal function on IV fluid per nephrology.   Consultants:   Nephrology  Procedures:    None  Antimicrobials:   None   Subjective: Patient seen and evaluated today with ongoing diarrhea which appears to be very slowly responsive to current treatment.  She continues to have multiple liquidy bowel movements on a daily basis.  Objective: Vitals:   03/07/18 1413 03/07/18 1953 03/08/18 0450 03/08/18 0540  BP: 133/62 (!) 152/56 122/60   Pulse: 76 79 78   Resp: 15 16 18    Temp: (!) 97.5 F (36.4 C) 98.9 F (37.2 C) 97.8 F (36.6 C)   TempSrc: Oral Oral Oral   SpO2: 94% 99% 96% 100%  Weight:      Height:        Intake/Output Summary (Last 24 hours) at 03/08/2018 1021 Last data filed at 03/08/2018 0921 Gross per 24 hour  Intake 1670 ml  Output 1200 ml  Net 470 ml   Filed Weights   03/03/18 2342 03/04/18 0800  Weight: 69.9 kg (154 lb) 64.8 kg (142 lb 13.7 oz)    Examination:  General exam: Appears calm and comfortable  Respiratory system: Clear to auscultation. Respiratory effort normal. Cardiovascular system: S1 & S2 heard, RRR. No JVD, murmurs, rubs, gallops or clicks. No pedal edema. Gastrointestinal system: Abdomen is nondistended, soft and nontender. No organomegaly or masses felt. Normal bowel sounds heard. Central nervous system: Alert and oriented. No focal neurological deficits. Extremities: Symmetric 5 x 5 power. Skin: No rashes, lesions or ulcers Psychiatry: Judgement and insight appear normal. Mood & affect appropriate.     Data Reviewed: I have personally reviewed following labs and imaging studies  CBC: Recent Labs  Lab 03/04/18 1014 03/05/18 0550 03/06/18 0552 03/07/18 0536  03/08/18 0645  WBC 10.9* 10.4 10.7* 10.6* 9.6  HGB 11.5* 9.3* 9.4* 9.5* 9.2*  HCT 32.3* 26.6* 27.0* 27.9* 27.1*  MCV 82.4 83.9 85.7 87.2 89.4  PLT 289 189 200 183 182   Basic Metabolic Panel: Recent Labs  Lab 03/04/18 0002 03/04/18 1014 03/05/18 0550 03/06/18 0552 03/07/18 0536 03/08/18 0645  NA 130*  --  134*  134* 135 136  135 138  K 2.4*  --   3.0*  3.0* 3.5 4.2  4.2 5.0  CL 89*  --  99*  99* 102 108  107 112*  CO2 15*  --  24  23 22  20*  20* 18*  GLUCOSE 255*  --  169*  170* 307* 207*  205* 195*  BUN 96*  --  70*  71* 48* 35*  35* 27*  CREATININE 9.24* 6.95* 3.91*  3.91* 2.06* 1.61*  1.55* 1.45*  CALCIUM 8.3*  --  7.4*  7.5* 8.3* 8.8*  8.8* 9.0  MG 1.5*  --   --   --   --   --   PHOS  --   --  2.7  --  2.0* 1.8*   GFR: Estimated Creatinine Clearance: 35.1 mL/min (A) (by C-G formula based on SCr of 1.45 mg/dL (H)). Liver Function Tests: Recent Labs  Lab 03/04/18 0002 03/05/18 0550 03/06/18 0552 03/07/18 0536 03/08/18 0645  AST 16 15 18   --   --   ALT 16 13* 16  --   --   ALKPHOS 105 68 69  --   --   BILITOT 0.8 0.9 0.4  --   --   PROT 9.1* 5.9* 6.0*  --   --   ALBUMIN 4.3 2.9*  2.9* 3.0* 3.1* 3.0*   Recent Labs  Lab 03/04/18 0002 03/05/18 0550  LIPASE 1,045* 1,151*   No results for input(s): AMMONIA in the last 168 hours. Coagulation Profile: No results for input(s): INR, PROTIME in the last 168 hours. Cardiac Enzymes: No results for input(s): CKTOTAL, CKMB, CKMBINDEX, TROPONINI in the last 168 hours. BNP (last 3 results) No results for input(s): PROBNP in the last 8760 hours. HbA1C: No results for input(s): HGBA1C in the last 72 hours. CBG: Recent Labs  Lab 03/07/18 1639 03/07/18 1951 03/08/18 0021 03/08/18 0454 03/08/18 0753  GLUCAP 256* 283* 283* 184* 196*   Lipid Profile: No results for input(s): CHOL, HDL, LDLCALC, TRIG, CHOLHDL, LDLDIRECT in the last 72 hours. Thyroid Function Tests: No results for input(s): TSH, T4TOTAL, FREET4, T3FREE, THYROIDAB in the last 72 hours. Anemia Panel: No results for input(s): VITAMINB12, FOLATE, FERRITIN, TIBC, IRON, RETICCTPCT in the last 72 hours. Sepsis Labs: Recent Labs  Lab 03/04/18 0031 03/04/18 0045 03/04/18 0315  LATICACIDVEN 2.49* 1.64 1.21    Recent Results (from the past 240 hour(s))  Gastrointestinal Panel by PCR , Stool      Status: Abnormal   Collection Time: 03/04/18  4:11 PM  Result Value Ref Range Status   Campylobacter species NOT DETECTED NOT DETECTED Final   Plesimonas shigelloides NOT DETECTED NOT DETECTED Final   Salmonella species DETECTED (A) NOT DETECTED Final    Comment: RESULT CALLED TO, READ BACK BY AND VERIFIED WITH: BRITTANY FOLEY 03/06/18 1518 KLW    Yersinia enterocolitica NOT DETECTED NOT DETECTED Final   Vibrio species NOT DETECTED NOT DETECTED Final   Vibrio cholerae NOT DETECTED NOT DETECTED Final   Enteroaggregative E coli (EAEC) NOT DETECTED NOT DETECTED Final   Enteropathogenic E coli (EPEC)  NOT DETECTED NOT DETECTED Final   Enterotoxigenic E coli (ETEC) NOT DETECTED NOT DETECTED Final   Shiga like toxin producing E coli (STEC) NOT DETECTED NOT DETECTED Final   Shigella/Enteroinvasive E coli (EIEC) NOT DETECTED NOT DETECTED Final   Cryptosporidium NOT DETECTED NOT DETECTED Final   Cyclospora cayetanensis NOT DETECTED NOT DETECTED Final   Entamoeba histolytica NOT DETECTED NOT DETECTED Final   Giardia lamblia NOT DETECTED NOT DETECTED Final   Adenovirus F40/41 NOT DETECTED NOT DETECTED Final   Astrovirus NOT DETECTED NOT DETECTED Final   Norovirus GI/GII NOT DETECTED NOT DETECTED Final   Rotavirus A NOT DETECTED NOT DETECTED Final   Sapovirus (I, II, IV, and V) NOT DETECTED NOT DETECTED Final    Comment: Performed at [facility], [address], [place], [place] [address]         Radiology Studies: No results found.      Scheduled Meds: . allopurinol  50 mg Oral Daily  . aspirin EC  81 mg Oral Daily  . citalopram  20 mg Oral Daily  . dicyclomine  10 mg Oral TID AC  . feeding supplement (GLUCERNA SHAKE)  237 mL Oral TID BM  . fluconazole  100 mg Oral Daily  . gabapentin  100 mg Oral TID  . heparin  5,000 Units Subcutaneous Q8H  . insulin aspart  0-20 Units Subcutaneous Q4H  . insulin detemir  30 Units Subcutaneous Daily  . metoprolol tartrate  100 mg  Oral BID  . multivitamin with minerals  1 tablet Oral Daily  . oxybutynin  2.5 mg Oral BID  . pantoprazole  40 mg Oral Daily  . sodium chloride flush  10-40 mL Intracatheter Q12H   Continuous Infusions: . cefTRIAXone (ROCEPHIN)  IV Stopped (03/07/18 1214)     LOS: 4 days    Time spent: 30 minutes    Chudney Scheffler Darleen Crocker, DO [facility] Pager [phone]  If 7PM-7AM, please contact night-coverage [email or web address] Password [employer] 03/08/2018, 10:21 AM

## 2018-03-09 ENCOUNTER — Inpatient Hospital Stay (HOSPITAL_COMMUNITY): Payer: Medicare Other

## 2018-03-09 LAB — GLUCOSE, CAPILLARY
Glucose-Capillary: 217 mg/dL — ABNORMAL HIGH (ref 65–99)
Glucose-Capillary: 276 mg/dL — ABNORMAL HIGH (ref 65–99)
Glucose-Capillary: 322 mg/dL — ABNORMAL HIGH (ref 65–99)
Glucose-Capillary: 428 mg/dL — ABNORMAL HIGH (ref 65–99)
Glucose-Capillary: 456 mg/dL — ABNORMAL HIGH (ref 65–99)
Glucose-Capillary: 488 mg/dL — ABNORMAL HIGH (ref 65–99)

## 2018-03-09 LAB — RENAL FUNCTION PANEL
Albumin: 2.8 g/dL — ABNORMAL LOW (ref 3.5–5.0)
Anion gap: 7 (ref 5–15)
BUN: 27 mg/dL — ABNORMAL HIGH (ref 6–20)
CHLORIDE: 111 mmol/L (ref 101–111)
CO2: 16 mmol/L — AB (ref 22–32)
CREATININE: 1.61 mg/dL — AB (ref 0.44–1.00)
Calcium: 8.7 mg/dL — ABNORMAL LOW (ref 8.9–10.3)
GFR calc non Af Amer: 33 mL/min — ABNORMAL LOW (ref >60)
GFR, EST AFRICAN AMERICAN: 38 mL/min — AB (ref >60)
Glucose, Bld: 261 mg/dL — ABNORMAL HIGH (ref 65–99)
POTASSIUM: 5.2 mmol/L — AB (ref 3.5–5.1)
Phosphorus: 2.4 mg/dL — ABNORMAL LOW (ref 2.5–4.6)
Sodium: 134 mmol/L — ABNORMAL LOW (ref 135–145)

## 2018-03-09 LAB — CBC
HCT: 23.7 % — ABNORMAL LOW (ref 36.0–46.0)
HEMOGLOBIN: 7.8 g/dL — AB (ref 12.0–15.0)
MCH: 29.9 pg (ref 26.0–34.0)
MCHC: 32.9 g/dL (ref 30.0–36.0)
MCV: 90.8 fL (ref 78.0–100.0)
Platelets: 150 10*3/uL (ref 150–400)
RBC: 2.61 MIL/uL — AB (ref 3.87–5.11)
RDW: 14.9 % (ref 11.5–15.5)
WBC: 7.7 10*3/uL (ref 4.0–10.5)

## 2018-03-09 LAB — URIC ACID: Uric Acid, Serum: 7.1 mg/dL — ABNORMAL HIGH (ref 2.3–6.6)

## 2018-03-09 MED ORDER — OXYCODONE HCL 5 MG PO TABS
10.0000 mg | ORAL_TABLET | Freq: Four times a day (QID) | ORAL | Status: DC | PRN
  Administered 2018-03-09 – 2018-03-13 (×10): 10 mg via ORAL
  Filled 2018-03-09 (×10): qty 2

## 2018-03-09 MED ORDER — METHYLPREDNISOLONE SODIUM SUCC 40 MG IJ SOLR
40.0000 mg | Freq: Two times a day (BID) | INTRAMUSCULAR | Status: DC
  Administered 2018-03-09 (×2): 40 mg via INTRAVENOUS
  Filled 2018-03-09 (×2): qty 1

## 2018-03-09 MED ORDER — INSULIN ASPART 100 UNIT/ML ~~LOC~~ SOLN
30.0000 [IU] | Freq: Once | SUBCUTANEOUS | Status: AC
  Administered 2018-03-09: 30 [IU] via SUBCUTANEOUS

## 2018-03-09 MED ORDER — GUAIFENESIN-DM 100-10 MG/5ML PO SYRP
5.0000 mL | ORAL_SOLUTION | ORAL | Status: DC | PRN
  Administered 2018-03-09: 5 mL via ORAL
  Filled 2018-03-09: qty 5

## 2018-03-09 MED ORDER — INSULIN DETEMIR 100 UNIT/ML ~~LOC~~ SOLN
50.0000 [IU] | Freq: Every day | SUBCUTANEOUS | Status: DC
  Filled 2018-03-09: qty 0.5

## 2018-03-09 MED ORDER — INSULIN ASPART 100 UNIT/ML ~~LOC~~ SOLN
25.0000 [IU] | Freq: Once | SUBCUTANEOUS | Status: AC
  Administered 2018-03-09: 25 [IU] via SUBCUTANEOUS

## 2018-03-09 NOTE — Progress Notes (Signed)
Subjective: Interval History: Patient is feeling better as far as the diarrhea is concerned.  Complains of weakness, generalized pain especially her right arm.  Complains of also poor appetite. Objective: Vital signs in last 24 hours: Temp:  [98.5 F (36.9 C)-99.5 F (37.5 C)] 98.5 F (36.9 C) (05/12 0347) Pulse Rate:  [78-83] 78 (05/12 0347) Resp:  [15-18] 18 (05/12 0347) BP: (129-135)/(38-55) 135/55 (05/12 0347) SpO2:  [95 %-99 %] 98 % (05/12 0426) Weight change:   Intake/Output from previous day: 05/11 0701 - 05/12 0700 In: 720 [P.O.:720] Out: -  Intake/Output this shift: No intake/output data recorded.  Generally patient is alert and in no apparent distress Chest is clear to auscultation Heart exam revealed regular rate and rhythm no murmur Abdomen: Soft, slight tenderness, no rebound tenderness and positive bowel sounds Extremities no edema  Lab Results: Recent Labs    03/08/18 0645 03/09/18 0624  WBC 9.6 7.7  HGB 9.2* 7.8*  HCT 27.1* 23.7*  PLT 178 150   BMET:  Recent Labs    03/08/18 0645 03/09/18 0624  NA 138 134*  K 5.0 5.2*  CL 112* 111  CO2 18* 16*  GLUCOSE 195* 261*  BUN 27* 27*  CREATININE 1.45* 1.61*  CALCIUM 9.0 8.7*   No results for input(s): PTH in the last 72 hours. Iron Studies: No results for input(s): IRON, TIBC, TRANSFERRIN, FERRITIN in the last 72 hours.  Studies/Results: No results found.  I have reviewed the patient's current medications.  Assessment/Plan: 1] acute kidney injury superimposed on chronic. her renal function has returned to his baseline. 2] hypokalemia: Has corrected.  Her potassium is high normal. 3] bone mineral disorder: Her calcium is range but her phosphorus is low  4] anemia: Her hemoglobin is below target goal but stable.  5] hypertension: Her blood pressure is reasonably controlled 6] history of gastroenteritis.  Improving.  Her diarrhea has a slow down. 7] history of irritable bowel syndrome Plan: 1] we  will put her improved low potassium diet 2] we will see patient in 4 weeks as an outpatient. 3] since her renal function has returned to her baseline my contribution will be limited and I will sign off.  Thank you for letting me participate in her care.   LOS: 5 days   Doni Widmer S 03/09/2018,9:02 AM

## 2018-03-09 NOTE — Progress Notes (Signed)
PROGRESS NOTE    Heather [name]  EXN:[id number] DOB: [birth year] DOA: 03/03/2018 PCP: [name], MD   Brief Narrative:   Heather [name]  is a 67 y.o. female, with history of chronic kidney disease stage III, diabetes mellitus, hiatal hernia came to hospital with complaints of vomiting and diarrhea since Thursday.    She has been admitted with acute pancreatitis as well as acute kidney injury suspected secondary to dehydration from vomiting and diarrhea.  Nephrology has been consulted for evaluation and management of AKI which has now resolved.  She continues to have ongoing diarrhea and crampy abdominal pain with slow improvement.    Assessment & Plan:   Active Problems:   AKI (acute kidney injury) ([place])   Protein-calorie malnutrition, severe   1. Acute pancreatitis-resolved.  She has had multiple prior episodes of this and does follow with gastroenterology in the outpatient setting.  She is now tolerating soft diet with no further nausea or vomiting. 2. Salmonella gastroenteritis-slowly improving.  This appears to be accompanied with IBS flare which is prolonging her symptoms.  She has ongoing diarrhea with cramping that is slowly improving.  Continue Rocephin (day 3/7) for the severity along with Immodium and Bentyl for cramps.  IV fluid discontinued. 3. R arm suspected acute gout flare.  Continue allopurinol and check serum uric acid level.  Will start IV methylprednisolone for symptomatic treatment and increase oxycodone dose to 10 mg from 5 mg today.  Ultrasound of right upper extremity currently pending to rule out dvt. 4. AKI secondary to dehydration-now at baseline.  IV fluid discontinued per nephrology.  Nephrology S/O with recommendations for follow up in outpatient setting. 5. Diabetes mellitus with mild to moderate hyperglycemia.  Continue sliding scale insulin and restart portion of home Levemir with increase to 40 units daily yesterday; continue to monitor. 6. Vaginal  yeast infection.  Started on Diflucan per pharmacy day 3/7.  Currently on oxybutynin for bladder spasms. 7. GERD.  Restarted on PPI.   DVT prophylaxis:Heparin Code Status: DNR Family Communication: None at bedside Disposition Plan: Continue supportive care with treatment of acute gout flare as well as antibiotics for diarrhea and vaginal yeast infection.   Consultants:   Nephrology  Procedures:   None  Antimicrobials:   None   Subjective: Patient seen and evaluated today with worsening right upper extremity pain and swelling.  Ultrasound has been ordered but not performed as of yet.  She states that her abdominal symptoms are improving and renal function is now stable.  Objective: Vitals:   03/08/18 1952 03/08/18 2029 03/09/18 0347 03/09/18 0426  BP: (!) 129/46  (!) 135/55   Pulse: 83 81 78   Resp: 15 18 18    Temp: 98.9 F (37.2 C)  98.5 F (36.9 C)   TempSrc: Oral  Oral   SpO2: 98% 95% 98% 98%  Weight:      Height:        Intake/Output Summary (Last 24 hours) at 03/09/2018 1041 Last data filed at 03/09/2018 1000 Gross per 24 hour  Intake 720 ml  Output -  Net 720 ml   Filed Weights   03/03/18 2342 03/04/18 0800  Weight: 69.9 kg (154 lb) 64.8 kg (142 lb 13.7 oz)    Examination:  General exam: Appears calm and comfortable  Respiratory system: Clear to auscultation. Respiratory effort normal. Cardiovascular system: S1 & S2 heard, RRR. No JVD, murmurs, rubs, gallops or clicks. No pedal edema. Gastrointestinal system: Abdomen is nondistended, soft and nontender. No  organomegaly or masses felt. Normal bowel sounds heard. Central nervous system: Alert and oriented. No focal neurological deficits. Extremities: Symmetric 5 x 5 power.  Right elbow with tenderness to palpation and mild edema noted.  No erythema or ecchymosis or warmth. Skin: No rashes, lesions or ulcers Psychiatry: Judgement and insight appear normal. Mood & affect appropriate.     Data Reviewed:  I have personally reviewed following labs and imaging studies  CBC: Recent Labs  Lab 03/05/18 0550 03/06/18 0552 03/07/18 0536 03/08/18 0645 03/09/18 0624  WBC 10.4 10.7* 10.6* 9.6 7.7  HGB 9.3* 9.4* 9.5* 9.2* 7.8*  HCT 26.6* 27.0* 27.9* 27.1* 23.7*  MCV 83.9 85.7 87.2 89.4 90.8  PLT 189 200 183 178 440   Basic Metabolic Panel: Recent Labs  Lab 03/04/18 0002  03/05/18 0550 03/06/18 0552 03/07/18 0536 03/08/18 0645 03/09/18 0624  NA 130*  --  134*  134* 135 136  135 138 134*  K 2.4*  --  3.0*  3.0* 3.5 4.2  4.2 5.0 5.2*  CL 89*  --  99*  99* 102 108  107 112* 111  CO2 15*  --  24  23 22  20*  20* 18* 16*  GLUCOSE 255*  --  169*  170* 307* 207*  205* 195* 261*  BUN 96*  --  70*  71* 48* 35*  35* 27* 27*  CREATININE 9.24*   < > 3.91*  3.91* 2.06* 1.61*  1.55* 1.45* 1.61*  CALCIUM 8.3*  --  7.4*  7.5* 8.3* 8.8*  8.8* 9.0 8.7*  MG 1.5*  --   --   --   --   --   --   PHOS  --   --  2.7  --  2.0* 1.8* 2.4*   < > = values in this interval not displayed.   GFR: Estimated Creatinine Clearance: 31.6 mL/min (A) (by C-G formula based on SCr of 1.61 mg/dL (H)). Liver Function Tests: Recent Labs  Lab 03/04/18 0002 03/05/18 0550 03/06/18 0552 03/07/18 0536 03/08/18 0645 03/09/18 0624  AST 16 15 18   --   --   --   ALT 16 13* 16  --   --   --   ALKPHOS 105 68 69  --   --   --   BILITOT 0.8 0.9 0.4  --   --   --   PROT 9.1* 5.9* 6.0*  --   --   --   ALBUMIN 4.3 2.9*  2.9* 3.0* 3.1* 3.0* 2.8*   Recent Labs  Lab 03/04/18 0002 03/05/18 0550  LIPASE 1,045* 1,151*   No results for input(s): AMMONIA in the last 168 hours. Coagulation Profile: No results for input(s): INR, PROTIME in the last 168 hours. Cardiac Enzymes: No results for input(s): CKTOTAL, CKMB, CKMBINDEX, TROPONINI in the last 168 hours. BNP (last 3 results) No results for input(s): PROBNP in the last 8760 hours. HbA1C: No results for input(s): HGBA1C in the last 72 hours. CBG: Recent Labs    Lab 03/08/18 1622 03/08/18 1948 03/08/18 2349 03/09/18 0349 03/09/18 0748  GLUCAP 334* 283* 329* 276* 217*   Lipid Profile: No results for input(s): CHOL, HDL, LDLCALC, TRIG, CHOLHDL, LDLDIRECT in the last 72 hours. Thyroid Function Tests: No results for input(s): TSH, T4TOTAL, FREET4, T3FREE, THYROIDAB in the last 72 hours. Anemia Panel: No results for input(s): VITAMINB12, FOLATE, FERRITIN, TIBC, IRON, RETICCTPCT in the last 72 hours. Sepsis Labs: Recent Labs  Lab 03/04/18 0031 03/04/18 0045 03/04/18  0315  LATICACIDVEN 2.49* 1.64 1.21    Recent Results (from the past 240 hour(s))  Gastrointestinal Panel by PCR , Stool     Status: Abnormal   Collection Time: 03/04/18  4:11 PM  Result Value Ref Range Status   Campylobacter species NOT DETECTED NOT DETECTED Final   Plesimonas shigelloides NOT DETECTED NOT DETECTED Final   Salmonella species DETECTED (A) NOT DETECTED Final    Comment: RESULT CALLED TO, READ BACK BY AND VERIFIED WITH: BRITTANY FOLEY 03/06/18 1518 KLW    Yersinia enterocolitica NOT DETECTED NOT DETECTED Final   Vibrio species NOT DETECTED NOT DETECTED Final   Vibrio cholerae NOT DETECTED NOT DETECTED Final   Enteroaggregative E coli (EAEC) NOT DETECTED NOT DETECTED Final   Enteropathogenic E coli (EPEC) NOT DETECTED NOT DETECTED Final   Enterotoxigenic E coli (ETEC) NOT DETECTED NOT DETECTED Final   Shiga like toxin producing E coli (STEC) NOT DETECTED NOT DETECTED Final   Shigella/Enteroinvasive E coli (EIEC) NOT DETECTED NOT DETECTED Final   Cryptosporidium NOT DETECTED NOT DETECTED Final   Cyclospora cayetanensis NOT DETECTED NOT DETECTED Final   Entamoeba histolytica NOT DETECTED NOT DETECTED Final   Giardia lamblia NOT DETECTED NOT DETECTED Final   Adenovirus F40/41 NOT DETECTED NOT DETECTED Final   Astrovirus NOT DETECTED NOT DETECTED Final   Norovirus GI/GII NOT DETECTED NOT DETECTED Final   Rotavirus A NOT DETECTED NOT DETECTED Final   Sapovirus  (I, II, IV, and V) NOT DETECTED NOT DETECTED Final    Comment: Performed at [facility], [address] [place], [place] [address]         Radiology Studies: No results found.      Scheduled Meds: . allopurinol  50 mg Oral Daily  . aspirin EC  81 mg Oral Daily  . citalopram  20 mg Oral Daily  . colesevelam  625 mg Oral BID WC  . dicyclomine  10 mg Oral TID AC  . feeding supplement (GLUCERNA SHAKE)  237 mL Oral TID BM  . fluconazole  100 mg Oral Daily  . gabapentin  100 mg Oral TID  . heparin  5,000 Units Subcutaneous Q8H  . insulin aspart  0-20 Units Subcutaneous Q4H  . insulin detemir  40 Units Subcutaneous Daily  . methylPREDNISolone (SOLU-MEDROL) injection  40 mg Intravenous Q12H  . metoprolol tartrate  100 mg Oral BID  . multivitamin with minerals  1 tablet Oral Daily  . oxybutynin  2.5 mg Oral BID  . pantoprazole  40 mg Oral Daily  . sodium chloride flush  10-40 mL Intracatheter Q12H   Continuous Infusions: . cefTRIAXone (ROCEPHIN)  IV Stopped (03/08/18 1110)     LOS: 5 days    Time spent: 30 minutes    Jaanai Salemi Darleen Crocker, DO [facility] Pager [phone]  If 7PM-7AM, please contact night-coverage [email or web address] Password [facility] 03/09/2018, 10:41 AM

## 2018-03-10 ENCOUNTER — Encounter (HOSPITAL_COMMUNITY): Payer: Self-pay | Admitting: Gastroenterology

## 2018-03-10 DIAGNOSIS — K529 Noninfective gastroenteritis and colitis, unspecified: Secondary | ICD-10-CM

## 2018-03-10 DIAGNOSIS — R748 Abnormal levels of other serum enzymes: Secondary | ICD-10-CM

## 2018-03-10 DIAGNOSIS — K5000 Crohn's disease of small intestine without complications: Secondary | ICD-10-CM

## 2018-03-10 DIAGNOSIS — K50018 Crohn's disease of small intestine with other complication: Secondary | ICD-10-CM

## 2018-03-10 LAB — BASIC METABOLIC PANEL
ANION GAP: 10 (ref 5–15)
BUN: 38 mg/dL — ABNORMAL HIGH (ref 6–20)
CALCIUM: 9.4 mg/dL (ref 8.9–10.3)
CHLORIDE: 106 mmol/L (ref 101–111)
CO2: 15 mmol/L — ABNORMAL LOW (ref 22–32)
Creatinine, Ser: 1.9 mg/dL — ABNORMAL HIGH (ref 0.44–1.00)
GFR calc Af Amer: 31 mL/min — ABNORMAL LOW (ref >60)
GFR calc non Af Amer: 27 mL/min — ABNORMAL LOW (ref >60)
GLUCOSE: 397 mg/dL — AB (ref 65–99)
Potassium: 6.2 mmol/L — ABNORMAL HIGH (ref 3.5–5.1)
Sodium: 131 mmol/L — ABNORMAL LOW (ref 135–145)

## 2018-03-10 LAB — CBC
HEMATOCRIT: 27.8 % — AB (ref 36.0–46.0)
HEMOGLOBIN: 9 g/dL — AB (ref 12.0–15.0)
MCH: 29.6 pg (ref 26.0–34.0)
MCHC: 32.4 g/dL (ref 30.0–36.0)
MCV: 91.4 fL (ref 78.0–100.0)
Platelets: 185 10*3/uL (ref 150–400)
RBC: 3.04 MIL/uL — ABNORMAL LOW (ref 3.87–5.11)
RDW: 14.8 % (ref 11.5–15.5)
WBC: 18.5 10*3/uL — ABNORMAL HIGH (ref 4.0–10.5)

## 2018-03-10 LAB — GLUCOSE, CAPILLARY
GLUCOSE-CAPILLARY: 399 mg/dL — AB (ref 65–99)
GLUCOSE-CAPILLARY: 391 mg/dL — AB (ref 65–99)
GLUCOSE-CAPILLARY: 328 mg/dL — AB (ref 65–99)
Glucose-Capillary: 331 mg/dL — ABNORMAL HIGH (ref 65–99)
Glucose-Capillary: 192 mg/dL — ABNORMAL HIGH (ref 65–99)

## 2018-03-10 MED ORDER — METHYLPREDNISOLONE SODIUM SUCC 40 MG IJ SOLR
20.0000 mg | Freq: Two times a day (BID) | INTRAMUSCULAR | Status: DC
  Administered 2018-03-10 – 2018-03-11 (×3): 20 mg via INTRAVENOUS
  Filled 2018-03-10 (×3): qty 1

## 2018-03-10 MED ORDER — SODIUM POLYSTYRENE SULFONATE 15 GM/60ML PO SUSP
30.0000 g | Freq: Once | ORAL | Status: AC
  Administered 2018-03-10: 30 g via ORAL
  Filled 2018-03-10: qty 120

## 2018-03-10 MED ORDER — SODIUM CHLORIDE 0.9 % IV SOLN
INTRAVENOUS | Status: DC
  Administered 2018-03-10 – 2018-03-11 (×2): via INTRAVENOUS

## 2018-03-10 MED ORDER — INSULIN DETEMIR 100 UNIT/ML ~~LOC~~ SOLN
60.0000 [IU] | Freq: Every day | SUBCUTANEOUS | Status: DC
  Administered 2018-03-10 – 2018-03-12 (×3): 60 [IU] via SUBCUTANEOUS
  Filled 2018-03-10 (×4): qty 0.6

## 2018-03-10 MED ORDER — INSULIN ASPART 100 UNIT/ML ~~LOC~~ SOLN
15.0000 [IU] | Freq: Three times a day (TID) | SUBCUTANEOUS | Status: DC
  Administered 2018-03-10 – 2018-03-12 (×6): 15 [IU] via SUBCUTANEOUS

## 2018-03-10 MED ORDER — COLESEVELAM HCL 625 MG PO TABS
1250.0000 mg | ORAL_TABLET | Freq: Two times a day (BID) | ORAL | Status: DC
  Administered 2018-03-10 – 2018-03-13 (×7): 1250 mg via ORAL
  Filled 2018-03-10 (×8): qty 2

## 2018-03-10 MED ORDER — PANTOPRAZOLE SODIUM 40 MG PO TBEC
40.0000 mg | DELAYED_RELEASE_TABLET | Freq: Every day | ORAL | Status: DC
  Administered 2018-03-11 – 2018-03-13 (×3): 40 mg via ORAL
  Filled 2018-03-10 (×3): qty 1

## 2018-03-10 MED ORDER — ONDANSETRON 4 MG PO TBDP
4.0000 mg | ORAL_TABLET | Freq: Three times a day (TID) | ORAL | Status: DC
  Administered 2018-03-10 – 2018-03-13 (×12): 4 mg via ORAL
  Filled 2018-03-10 (×12): qty 1

## 2018-03-10 MED ORDER — INSULIN ASPART 100 UNIT/ML ~~LOC~~ SOLN
10.0000 [IU] | Freq: Three times a day (TID) | SUBCUTANEOUS | Status: DC
  Administered 2018-03-10: 10 [IU] via SUBCUTANEOUS

## 2018-03-10 NOTE — Consult Note (Signed)
Referring Provider: [name]  Primary Care Physician:  [name], MD Primary Gastroenterologist:  Dr. Carlean Purl   Date of Admission: 03/04/18 Date of Consultation: 03/10/18  Reason for Consultation:  Diarrhea and history of IBS   HPI:  Heather [name] is a 67 y.o. year old female with history of IBS, chronic nausea and vomiting with suspected gastroparesis, followed by Dr. Carlean Purl in [place]. Last seen as outpatient in Aug 2018. She was taking Reglan at that time for suspected gastroparesis. Prior outside [place] in 2012 at [place] with delayed gastric emptying. No significant improvement with Lotrenex in the past. She was prescribed prn Zofran due to case studies of improvement with IBS-D. If not improved, plans were for course of Xifaxan. There was mention of possible history of pancreatitis in the past per outpatient notes in epic, with MRCP in March 2018 noting normal pancrease. She had notably elevated lipase on this admission in the 1000 range. Pancreas on non-contrast CT was unremarkable. Per phone notes in April 2019, Stool for Cdiff and fecal elastase was requested but not completed. She had acute on chronic renal failure with creatinine of 9 on admission in the setting of N/V/D.   CT abd/pelvis without contrast on admission with thickened appearance of stomach that could be related to underdistension vs gastritis, normal caliber fluid-filled loops of small bowel, loose stool noted throughout colon. GI pathogen panel with Salmonella on 03/04/18. Cdiff not on file this admission. Leukocytosis noted today with WBC count 18.5.   Still with nausea and vomited about an hour ago. States she was told she had Crohn's disease in 2012 but colonoscopy and EGD in 2017 normal. Difficult historian. Intermittent confusion today. Nursing staff state she has been like this today. When I initially spoke with patient, she noted profuse, watery diarrhea. Has had about 6 stools today. Having abdominal cramping  lower abdomen. Feels dizzy after having BM. Notes postprandial BMs. Chronic decreased appetite for years. Smells breakfast and makes her nauseated, chronic.   I spoke with nursing staff who state she had resolution of diarrhea over the past 2 days; however, diarrhea resumed when taking kayexalate today. Potassium 6.2. Nephrology following.     Past Medical History:  Diagnosis Date  . Acute renal failure superimposed on stage 3 chronic kidney disease ([place]) 01/21/2016  . B12 deficiency   . Cellulitis and abscess    Abdomen and buttocks  . CKD (chronic kidney disease) stage 3, GFR 30-59 ml/min (HCC)   . Closed fracture of distal end of femur, unspecified fracture morphology, initial encounter ([place])   . Depression   . Diabetic neuropathy ([place])   . DJD (degenerative joint disease)    Right forminal stenosis C4-5  . Essential hypertension   . Femur fracture, right ([place]) 03/30/2017  . Folliculitis   . Gout   . Headache(784.0)   . Hiatal hernia   . History of bronchitis   . History of cardiac catheterization    No significant CAD 2012  . Insomnia   . Irritable bowel syndrome   . Mixed hyperlipidemia   . Osteoarthritis   . Palpitations   . Reflux esophagitis   . Tinnitus    Right  . Type 2 diabetes mellitus [facility])     Past Surgical History:  Procedure Laterality Date  . c-spine surgery     03/2007  . CARDIAC CATHETERIZATION    . CHOLECYSTECTOMY    . COLONOSCOPY  04/21/2011; 05/29/11   6/12: [place] - ?AVM at IC valve, inflammatory  changes at [facility] valve [facility]); 7/12 - [facility]; IC valve erosions, look chronic and probably Crohn's per path  . colonoscopy  2017   [place]: random biopsies normal. TI normal  . ESOPHAGOGASTRODUODENOSCOPY  05/29/11   normal  . ESOPHAGOGASTRODUODENOSCOPY N/A 01/21/2016   Dr. Michail Sermon: normal  . IR FLUORO GUIDE CV LINE RIGHT  04/01/2017  . IR US GUIDE VASC ACCESS RIGHT  04/01/2017  . JOINT REPLACEMENT    . ORIF FEMUR FRACTURE Right 03/31/2017    Procedure: OPEN REDUCTION INTERNAL FIXATION (ORIF) DISTAL FEMUR FRACTURE;  Surgeon: Paralee Cancel, MD;  Location: [facility];  Service: Orthopedics;  Laterality: Right;  . Right knee arthroscopic surgery    . TOTAL ABDOMINAL HYSTERECTOMY      Prior to Admission medications   Medication Sig Start Date End Date Taking? Authorizing Provider  allopurinol (ZYLOPRIM) 100 MG tablet Take 100 mg by mouth daily.   Yes [provider]  aspirin EC 81 MG tablet Take 81 mg by mouth daily.   Yes [provider]  atorvastatin (LIPITOR) 80 MG tablet Take 80 mg by mouth daily.   Yes [provider]  BD PEN NEEDLE NANO U/F 32G X 4 MM MISC  02/16/18  Yes [provider]  Blood Glucose Monitoring Suppl (ONETOUCH VERIO) w/Device KIT 1 each by Does not apply route as needed. Test 4 times daily 11/30/16  Yes Nida, Marella Chimes, MD  Cholecalciferol (VITAMIN D) 2000 units CAPS Take 2,000 Units by mouth daily.    Yes [provider]  citalopram (CELEXA) 40 MG tablet Take 0.5 tablets (20 mg total) by mouth daily. 04/03/17  Yes Rosita Fire, MD  cyanocobalamin (,VITAMIN B-12,) 1000 MCG/ML injection Inject 1,000 mcg into the muscle every 30 (thirty) days.     Yes [provider]  diazepam (VALIUM) 5 MG tablet Take 1 tablet (5 mg total) by mouth 2 (two) times daily as needed for anxiety. 04/03/17  Yes Rosita Fire, MD  esomeprazole (NEXIUM) 40 MG capsule Take 40 mg by mouth daily before breakfast.     Yes [provider]  furosemide (LASIX) 40 MG tablet Take 1 tablet (40 mg total) by mouth daily. 01/26/16  Yes Reyne Dumas, MD  gabapentin (NEURONTIN) 100 MG capsule Take 1 capsule (100 mg total) by mouth 3 (three) times daily. 01/03/18  Yes Carmin Muskrat, MD  glucose blood (ONE TOUCH TEST STRIPS) test strip Use as instructed 4 x daily. E11.65 11/30/16  Yes Nida, Marella Chimes, MD  Insulin Detemir (LEVEMIR FLEXTOUCH) 100 UNIT/ML Pen Inject 80 Units into the  skin at bedtime. 04/03/17  Yes Rosita Fire, MD  Insulin Lispro (HUMALOG KWIKPEN [place]) Inject into the skin 3 (three) times daily with meals. Per home sliding scale   Yes [provider]  liraglutide (VICTOZA) 18 MG/3ML SOPN USE 1.8 MG EVERY DAY AS DIRECTED 05/30/17  Yes Nida, Marella Chimes, MD  metoprolol (LOPRESSOR) 100 MG tablet Take 100 mg by mouth 2 (two) times daily.    Yes [provider]  ondansetron (ZOFRAN) 4 MG tablet 1-2 every 6 hrs as needed for nausea, cramps, diarrhea 06/26/17  Yes Gatha Mayer, MD  potassium chloride SA (K-DUR,KLOR-CON) 20 MEQ tablet Take 20 mEq by mouth daily.   Yes [provider]    Current Facility-Administered Medications  Medication Dose Route Frequency Provider Last Rate Last Dose  . 0.9 %  sodium chloride infusion   Intravenous Continuous Manuella Ghazi, Pratik D, DO 75 mL/hr at  03/10/18 1640    . albuterol (PROVENTIL) (2.5 MG/3ML) 0.083% nebulizer solution 2.5 mg  2.5 mg Nebulization Q6H PRN Manuella Ghazi, Pratik D, DO   2.5 mg at 03/10/18 0024  . allopurinol (ZYLOPRIM) tablet 50 mg  50 mg Oral Daily Manuella Ghazi, Pratik D, DO   50 mg at 03/10/18 1018  . aspirin EC tablet 81 mg  81 mg Oral Daily Oswald Hillock, MD   81 mg at 03/10/18 1017  . cefTRIAXone (ROCEPHIN) 2 g in sodium chloride 0.9 % 100 mL IVPB  2 g Intravenous Q24H Manuella Ghazi, Pratik D, DO   Stopped at 03/10/18 1100  . citalopram (CELEXA) tablet 20 mg  20 mg Oral Daily Oswald Hillock, MD   20 mg at 03/10/18 1018  . colesevelam [facility]) tablet 1,250 mg  1,250 mg Oral BID WC Shah, Pratik D, DO   1,250 mg at 03/10/18 1641  . diazepam (VALIUM) tablet 5 mg  5 mg Oral BID PRN Oswald Hillock, MD   5 mg at 03/09/18 1603  . dicyclomine (BENTYL) capsule 10 mg  10 mg Oral TID AC Shah, Pratik D, DO   10 mg at 03/10/18 1641  . feeding supplement (GLUCERNA SHAKE) (GLUCERNA SHAKE) liquid 237 mL  237 mL Oral TID BM Shah, Pratik D, DO   237 mL at 03/10/18 1300  . fluconazole (DIFLUCAN) tablet 100 mg  100 mg Oral  Daily Manuella Ghazi, Pratik D, DO   100 mg at 03/10/18 1017  . gabapentin (NEURONTIN) capsule 100 mg  100 mg Oral TID Oswald Hillock, MD   100 mg at 03/10/18 1641  . guaiFENesin-dextromethorphan (ROBITUSSIN DM) 100-10 MG/5ML syrup 5 mL  5 mL Oral Q4H PRN Manuella Ghazi, Pratik D, DO   5 mL at 03/09/18 2133  . heparin injection 5,000 Units  5,000 Units Subcutaneous Q8H Oswald Hillock, MD   5,000 Units at 03/10/18 1640  . insulin aspart (novoLOG) injection 0-20 Units  0-20 Units Subcutaneous Q4H Shah, Pratik D, DO   15 Units at 03/10/18 1641  . insulin aspart (novoLOG) injection 15 Units  15 Units Subcutaneous TID WC Shah, Pratik D, DO   15 Units at 03/10/18 1650  . insulin detemir (LEVEMIR) injection 60 Units  60 Units Subcutaneous Daily Manuella Ghazi, Pratik D, DO   60 Units at 03/10/18 1016  . loperamide (IMODIUM) capsule 2 mg  2 mg Oral PRN Manuella Ghazi, Pratik D, DO   2 mg at 03/07/18 1250  . methylPREDNISolone sodium succinate (SOLU-MEDROL) 40 mg/mL injection 20 mg  20 mg Intravenous Q12H Shah, Pratik D, DO   20 mg at 03/10/18 0813  . metoprolol tartrate (LOPRESSOR) tablet 100 mg  100 mg Oral BID Oswald Hillock, MD   100 mg at 03/10/18 1017  . morphine 2 MG/ML injection 2 mg  2 mg Intravenous Q3H PRN Barton Dubois, MD   2 mg at 03/09/18 1604  . multivitamin with minerals tablet 1 tablet  1 tablet Oral Daily Manuella Ghazi, Pratik D, DO   1 tablet at 03/10/18 1017  . ondansetron (ZOFRAN) tablet 4 mg  4 mg Oral Q6H PRN Oswald Hillock, MD   4 mg at 03/08/18 0521   Or  . ondansetron (ZOFRAN) injection 4 mg  4 mg Intravenous Q6H PRN Oswald Hillock, MD   4 mg at 03/08/18 2008  . oxybutynin (DITROPAN) tablet 2.5 mg  2.5 mg Oral BID Manuella Ghazi, Pratik D, DO   2.5 mg at 03/10/18 1017  . oxyCODONE (Oxy IR/ROXICODONE)  immediate release tablet 10 mg  10 mg Oral Q6H PRN Manuella Ghazi, Pratik D, DO   10 mg at 03/10/18 1018  . pantoprazole (PROTONIX) EC tablet 40 mg  40 mg Oral Daily Manuella Ghazi, Pratik D, DO   40 mg at 03/10/18 7915  . promethazine (PHENERGAN) injection 12.5 mg   12.5 mg Intravenous Q6H PRN Manuella Ghazi, Pratik D, DO   12.5 mg at 03/08/18 0631  . sodium chloride flush (NS) 0.9 % injection 10-40 mL  10-40 mL Intracatheter Q12H Fran Lowes, MD   10 mL at 03/10/18 1019  . sodium chloride flush (NS) 0.9 % injection 10-40 mL  10-40 mL Intracatheter PRN Fran Lowes, MD        Allergies as of 03/03/2018 - Review Complete 03/03/2018  Allergen Reaction Noted  . Duloxetine hcl Swelling 01/23/2016  . Fluocinolone Swelling 01/23/2016  . Acetaminophen Itching and Swelling 12/25/2016  . Amlodipine besylate Swelling   . Ciprofloxacin Nausea And Vomiting and Swelling   . Hydrocodone-acetaminophen Itching 12/22/2014  . Metformin Other (See Comments)   . Nsaids Other (See Comments) 08/29/2016  . Quinolones Swelling 12/22/2014  . Sulfamethoxazole Swelling 12/22/2014  . Sulfonamide derivatives Swelling   . Valsartan Swelling 12/22/2014  . Cozaar [losartan] Swelling and Rash 08/29/2016  . Lisinopril Swelling and Rash     Family History  Problem Relation Age of Onset  . Diabetes Mother   . Colon cancer Brother        POSSIBLY. Patient states diagnosed at age 41, then later states age 69. unclear and limited historian    Social History   Socioeconomic History  . Marital status: Married    Spouse name: Not on file  . Number of children: 2  . Years of education: Not on file  . Highest education level: Not on file  Occupational History  . Occupation: Disabled  Social Needs  . Financial resource strain: Not on file  . Food insecurity:    Worry: Not on file    Inability: Not on file  . Transportation needs:    Medical: Not on file    Non-medical: Not on file  Tobacco Use  . Smoking status: Never Smoker  . Smokeless tobacco: Never Used  Substance and Sexual Activity  . Alcohol use: No  . Drug use: No  . Sexual activity: Not on file  Lifestyle  . Physical activity:    Days per week: Not on file    Minutes per session: Not on file  . Stress:  Not on file  Relationships  . Social connections:    Talks on phone: Not on file    Gets together: Not on file    Attends religious service: Not on file    Active member of club or organization: Not on file    Attends meetings of clubs or organizations: Not on file    Relationship status: Not on file  . Intimate partner violence:    Fear of current or ex partner: Not on file    Emotionally abused: Not on file    Physically abused: Not on file    Forced sexual activity: Not on file  Other Topics Concern  . Not on file  Social History Narrative   2 glasses of tea daily     Review of Systems: Gen: Denies fever, chills, loss of appetite, change in weight or weight loss CV: Denies chest pain, heart palpitations, syncope, edema  Resp: Denies shortness of breath with rest, cough, wheezing GI: see HPI  GU : Denies urinary burning, urinary frequency, urinary incontinence.  MS: Denies joint pain,swelling, cramping Derm: Denies rash, itching, dry skin Psych: Denies depression, anxiety,confusion, or memory loss Heme: Denies bruising, bleeding, and enlarged lymph nodes.  Physical Exam: Vital signs in last 24 hours: Temp:  [97.8 F (36.6 C)] 97.8 F (36.6 C) (05/13 0412) Pulse Rate:  [87-93] 91 (05/13 1505) Resp:  [14-20] 20 (05/13 1505) BP: (153-163)/(69-80) 163/75 (05/13 1505) SpO2:  [97 %-100 %] 100 % (05/13 1505) Last BM Date: 03/08/18 General:   Alert,  Well-developed, well-nourished, pleasant and cooperative in NAD Head:  Normocephalic and atraumatic. Eyes:  Sclera clear, no icterus.   Conjunctiva pink. Ears:  Normal auditory acuity. Nose:  No deformity, discharge,  or lesions. Mouth:  No deformity or lesions Lungs:  Clear throughout to auscultation.    Heart:  Regular rate and rhythm Abdomen:  Soft, nontender and nondistended. No masses, hepatosplenomegaly or hernias noted. Normal bowel sounds, without guarding, and without rebound.   Rectal:  Deferred  Msk:  Symmetrical  without gross deformities. Normal posture. Extremities:  Without  edema. Neurologic:  Alert and  oriented x4 Psych:  Alert and cooperative. Normal mood and affect.  Intake/Output from previous day: 05/12 0701 - 05/13 0700 In: 720 [P.O.:720] Out: 4 [Urine:4] Intake/Output this shift: Total I/O In: 240 [P.O.:240] Out: -   Lab Results: Recent Labs    03/08/18 0645 03/09/18 0624 03/10/18 0604  WBC 9.6 7.7 18.5*  HGB 9.2* 7.8* 9.0*  HCT 27.1* 23.7* 27.8*  PLT 178 150 185   BMET Recent Labs    03/08/18 0645 03/09/18 0624 03/10/18 0604  NA 138 134* 131*  K 5.0 5.2* 6.2*  CL 112* 111 106  CO2 18* 16* 15*  GLUCOSE 195* 261* 397*  BUN 27* 27* 38*  CREATININE 1.45* 1.61* 1.90*  CALCIUM 9.0 8.7* 9.4   LFT Recent Labs    03/08/18 0645 03/09/18 0624  ALBUMIN 3.0* 2.8*   Lab Results  Component Value Date   LIPASE 1,151 (H) 03/05/2018     Studies/Results: US Venous Img Upper Uni Right  Result Date: 03/09/2018 CLINICAL DATA:  67 year old female with a history of arm swelling EXAM: RIGHT UPPER EXTREMITY VENOUS DOPPLER ULTRASOUND TECHNIQUE: Gray-scale sonography with graded compression, as well as color Doppler and duplex ultrasound were performed to evaluate the upper extremity deep venous system from the level of the subclavian vein and including the jugular, axillary, basilic, radial, ulnar and upper cephalic vein. Spectral Doppler was utilized to evaluate flow at rest and with distal augmentation maneuvers. COMPARISON:  None. FINDINGS: Contralateral Subclavian Vein: Respiratory phasicity is normal and symmetric with the symptomatic side. No evidence of thrombus. Normal compressibility. Internal Jugular Vein: No evidence of thrombus. Normal compressibility, respiratory phasicity and response to augmentation. Subclavian Vein: No evidence of thrombus. Normal compressibility, respiratory phasicity and response to augmentation. Axillary Vein: No evidence of thrombus. Normal  compressibility, respiratory phasicity and response to augmentation. Cephalic Vein: No evidence of thrombus. Normal compressibility, respiratory phasicity and response to augmentation. Basilic Vein: No evidence of thrombus. Normal compressibility, respiratory phasicity and response to augmentation. Brachial Veins: No evidence of thrombus. Normal compressibility, respiratory phasicity and response to augmentation. Radial Veins: No evidence of thrombus. Normal compressibility, respiratory phasicity and response to augmentation. Ulnar Veins: No evidence of thrombus. Normal compressibility, respiratory phasicity and response to augmentation. Other Findings:  None visualized. IMPRESSION: Sonographic survey of the right upper extremity negative for DVT. Electronically Signed   By: York Cerise  Earleen Newport D.O.   On: 03/09/2018 12:22    Impression: 67 year old female admitted with N/V/D, with GI pathogen panel completed noting Salmonella on 5/9. No Cdiff on file, as this is not included in a GI pathogen panel. History of chronic diarrhea as per HPI, followed by Dr. Carlean Purl in [place]. Consideration for pancreatic insufficiency was raised in the past per GI in [place]. Clinically, she had improved from a diarrheal standpoint per nursing staff as of 2 days ago. Today, she has received kayexalate with subsequent recurrent loose stool. Per nursing staff, does not seem characteristic of CDI. Would favor this is more medication-related in this setting. Will hold off on ordering Cdiff toxin and PCR unless she has persistent diarrhea in absence of kayexalate.   Elevated lipase: pancreas unremarkable on CT without contrast. Prior concern for pancreatitis raised in the past as outpatient, with MRCP in March 2018 normal. Lipase can also be elevated in setting of renal disease (creatinine was 9 on admission) and other etiologies. Clinically, she is doing well. Last lipase on file from 03/05/18. Can recheck in the morning but clinically  doing well. Recommend further evaluation as outpatient with primary GI in [place].  N/V: in setting of acute illness, salmonella. Prior delayed gastric emptying documented on GES in 2012 at [place]. Suspect diabetic gastoparesis in setting of poorly controlled diabetes. There is not a recent A1c on file, but it has been as high as 12 in Jan 2018 (most recently 8.06 Mar 2017). Previously on Reglan. Will add scheduled Zofran and only add Reglan if unable to tolerate diet. Continue PPI daily before breakfast.   Anemia: multifactorial in setting of renal disease. No overt GI bleeding. Colonoscopy/EGD on file from 2017. Further evaluation as outpatient.  Irregular liver contour on recent CT: in setting of fatty liver. Further evaluation as outpatient. Spleen normal, no thrombocytopenia.   Plan: Monitor clinical course: check Cdiff toxin assay and PCR if diarrhea present despite discontinuation of kayexalate CBC, BMP, lipase am Add scheduled Zofran, consider Reglan if unable to tolerate diet PPI each morning, 30 minutes before breakfast Follow-up with [facility] as outpatient for chronic issues Will continue to follow with you   Annitta Needs, PhD, ANP-BC [facility]     LOS: 6 days    03/10/2018, 5:21 PM

## 2018-03-10 NOTE — Progress Notes (Addendum)
PROGRESS NOTE    Heather [name]  VQQ:[id number] DOB: [birth year] DOA: 03/03/2018 PCP: [name], MD   Brief Narrative:   Heather [name]  is a 67 y.o. female, with history of chronic kidney disease stage III, diabetes mellitus, hiatal hernia came to hospital with complaints of vomiting and diarrhea since Thursday.    She has been admitted with acute pancreatitis as well as acute kidney injury suspected secondary to dehydration from vomiting and diarrhea.  Nephrology has been consulted for evaluation and management of AKI which has now resolved.  She continues to have ongoing diarrhea and crampy abdominal pain with little to no improvement.    Assessment & Plan:   Active Problems:   AKI (acute kidney injury) ([place])   Protein-calorie malnutrition, severe   1. Acute pancreatitis-resolved.  She has had multiple prior episodes of this and does follow with gastroenterology in the outpatient setting.  She is now tolerating diet with no further vomiting or epigastric pain, but does have some mild nausea. 2. Salmonella gastroenteritis-persistent and unresponsive to treatment.  This appears to be accompanied with IBS flare which is prolonging her symptoms.  She has ongoing diarrhea with cramping that is unimproved.  Continue Rocephin (day 4/7) for the severity along with Immodium and Bentyl for cramps.  IV fluid discontinued. 3. R arm suspected acute gout flare-improved.  Continue allopurinol and Solu-Medrol and decreased dose today as this is improving. 4. AKI secondary to dehydration-worsening.  IV fluid discontinued per nephrology, but will restart today as she appears to worsening again. Monitor with am labs. 5. Diabetes mellitus with mild to moderate hyperglycemia.  Continue sliding scale insulin and restart portion of home Levemir with increase to 60 units daily yesterday; continue to monitor.  Mealtime insulin added as well. 6. Vaginal yeast infection.  Started on Diflucan per pharmacy  day 4/7.  Currently on oxybutynin for bladder spasms. 7. GERD.  Restarted on PPI.   DVT prophylaxis:Heparin Code Status: DNR Family Communication: None at bedside Disposition Plan: Continue supportive care with treatment of acute gout flare as well as antibiotics for diarrhea and vaginal yeast infection.  Gastroenterology consulted for diarrhea evaluation as this is persistent and refractory to treatment.   Consultants:   Nephrology  Gastroenterology  Procedures:   None  Antimicrobials:   Rocephin  Diflucan   Subjective: Patient seen and evaluated today with persistent diarrhea with 6-7 loose bowel movements still noted on a daily basis.  She appears refractory to improvement despite multiple medications.  She states that her right arm pain is improved.  Objective: Vitals:   03/09/18 1442 03/09/18 2008 03/10/18 0024 03/10/18 0412  BP: (!) 142/67 (!) 153/69  (!) 154/80  Pulse: 89 92 87 93  Resp: 17 16 18 14   Temp: 98.4 F (36.9 C) 97.8 F (36.6 C)  97.8 F (36.6 C)  TempSrc: Oral Oral  Oral  SpO2: 96% 98% 97% 98%  Weight:      Height:        Intake/Output Summary (Last 24 hours) at 03/10/2018 1217 Last data filed at 03/10/2018 0900 Gross per 24 hour  Intake 720 ml  Output 4 ml  Net 716 ml   Filed Weights   03/03/18 2342 03/04/18 0800  Weight: 69.9 kg (154 lb) 64.8 kg (142 lb 13.7 oz)    Examination:  General exam: Appears calm and comfortable  Respiratory system: Clear to auscultation. Respiratory effort normal. Cardiovascular system: S1 & S2 heard, RRR. No JVD, murmurs, rubs, gallops or clicks.  No pedal edema. Gastrointestinal system: Abdomen is nondistended, soft and nontender. No organomegaly or masses felt. Normal bowel sounds heard. Central nervous system: Alert and oriented. No focal neurological deficits. Extremities: Symmetric 5 x 5 power.  Right elbow with tenderness to palpation and mild edema noted.  No erythema or ecchymosis or warmth. Skin:  No rashes, lesions or ulcers Psychiatry: Judgement and insight appear normal. Mood & affect appropriate.     Data Reviewed: I have personally reviewed following labs and imaging studies  CBC: Recent Labs  Lab 03/06/18 0552 03/07/18 0536 03/08/18 0645 03/09/18 0624 03/10/18 0604  WBC 10.7* 10.6* 9.6 7.7 18.5*  HGB 9.4* 9.5* 9.2* 7.8* 9.0*  HCT 27.0* 27.9* 27.1* 23.7* 27.8*  MCV 85.7 87.2 89.4 90.8 91.4  PLT 200 183 178 150 734   Basic Metabolic Panel: Recent Labs  Lab 03/04/18 0002  03/05/18 0550 03/06/18 0552 03/07/18 0536 03/08/18 0645 03/09/18 0624 03/10/18 0604  NA 130*  --  134*  134* 135 136  135 138 134* 131*  K 2.4*  --  3.0*  3.0* 3.5 4.2  4.2 5.0 5.2* 6.2*  CL 89*  --  99*  99* 102 108  107 112* 111 106  CO2 15*  --  24  23 22  20*  20* 18* 16* 15*  GLUCOSE 255*  --  169*  170* 307* 207*  205* 195* 261* 397*  BUN 96*  --  70*  71* 48* 35*  35* 27* 27* 38*  CREATININE 9.24*   < > 3.91*  3.91* 2.06* 1.61*  1.55* 1.45* 1.61* 1.90*  CALCIUM 8.3*  --  7.4*  7.5* 8.3* 8.8*  8.8* 9.0 8.7* 9.4  MG 1.5*  --   --   --   --   --   --   --   PHOS  --   --  2.7  --  2.0* 1.8* 2.4*  --    < > = values in this interval not displayed.   GFR: Estimated Creatinine Clearance: 26.7 mL/min (A) (by C-G formula based on SCr of 1.9 mg/dL (H)). Liver Function Tests: Recent Labs  Lab 03/04/18 0002 03/05/18 0550 03/06/18 0552 03/07/18 0536 03/08/18 0645 03/09/18 0624  AST 16 15 18   --   --   --   ALT 16 13* 16  --   --   --   ALKPHOS 105 68 69  --   --   --   BILITOT 0.8 0.9 0.4  --   --   --   PROT 9.1* 5.9* 6.0*  --   --   --   ALBUMIN 4.3 2.9*  2.9* 3.0* 3.1* 3.0* 2.8*   Recent Labs  Lab 03/04/18 0002 03/05/18 0550  LIPASE 1,045* 1,151*   No results for input(s): AMMONIA in the last 168 hours. Coagulation Profile: No results for input(s): INR, PROTIME in the last 168 hours. Cardiac Enzymes: No results for input(s): CKTOTAL, CKMB, CKMBINDEX,  TROPONINI in the last 168 hours. BNP (last 3 results) No results for input(s): PROBNP in the last 8760 hours. HbA1C: No results for input(s): HGBA1C in the last 72 hours. CBG: Recent Labs  Lab 03/09/18 2012 03/09/18 2352 03/10/18 0409 03/10/18 0751 03/10/18 1127  GLUCAP 456* 428* 399* 391* 328*   Lipid Profile: No results for input(s): CHOL, HDL, LDLCALC, TRIG, CHOLHDL, LDLDIRECT in the last 72 hours. Thyroid Function Tests: No results for input(s): TSH, T4TOTAL, FREET4, T3FREE, THYROIDAB in the last 72 hours. Anemia  Panel: No results for input(s): VITAMINB12, FOLATE, FERRITIN, TIBC, IRON, RETICCTPCT in the last 72 hours. Sepsis Labs: Recent Labs  Lab 03/04/18 0031 03/04/18 0045 03/04/18 0315  LATICACIDVEN 2.49* 1.64 1.21    Recent Results (from the past 240 hour(s))  Gastrointestinal Panel by PCR , Stool     Status: Abnormal   Collection Time: 03/04/18  4:11 PM  Result Value Ref Range Status   Campylobacter species NOT DETECTED NOT DETECTED Final   Plesimonas shigelloides NOT DETECTED NOT DETECTED Final   Salmonella species DETECTED (A) NOT DETECTED Final    Comment: RESULT CALLED TO, READ BACK BY AND VERIFIED WITH: BRITTANY FOLEY 03/06/18 1518 KLW    Yersinia enterocolitica NOT DETECTED NOT DETECTED Final   Vibrio species NOT DETECTED NOT DETECTED Final   Vibrio cholerae NOT DETECTED NOT DETECTED Final   Enteroaggregative E coli (EAEC) NOT DETECTED NOT DETECTED Final   Enteropathogenic E coli (EPEC) NOT DETECTED NOT DETECTED Final   Enterotoxigenic E coli (ETEC) NOT DETECTED NOT DETECTED Final   Shiga like toxin producing E coli (STEC) NOT DETECTED NOT DETECTED Final   Shigella/Enteroinvasive E coli (EIEC) NOT DETECTED NOT DETECTED Final   Cryptosporidium NOT DETECTED NOT DETECTED Final   Cyclospora cayetanensis NOT DETECTED NOT DETECTED Final   Entamoeba histolytica NOT DETECTED NOT DETECTED Final   Giardia lamblia NOT DETECTED NOT DETECTED Final   Adenovirus  F40/41 NOT DETECTED NOT DETECTED Final   Astrovirus NOT DETECTED NOT DETECTED Final   Norovirus GI/GII NOT DETECTED NOT DETECTED Final   Rotavirus A NOT DETECTED NOT DETECTED Final   Sapovirus (I, II, IV, and V) NOT DETECTED NOT DETECTED Final    Comment: Performed at [facility], [address] [place], [place] [address]         Radiology Studies: US Venous Img Upper Uni Right  Result Date: 03/09/2018 CLINICAL DATA:  67 year old female with a history of arm swelling EXAM: RIGHT UPPER EXTREMITY VENOUS DOPPLER ULTRASOUND TECHNIQUE: Gray-scale sonography with graded compression, as well as color Doppler and duplex ultrasound were performed to evaluate the upper extremity deep venous system from the level of the subclavian vein and including the jugular, axillary, basilic, radial, ulnar and upper cephalic vein. Spectral Doppler was utilized to evaluate flow at rest and with distal augmentation maneuvers. COMPARISON:  None. FINDINGS: Contralateral Subclavian Vein: Respiratory phasicity is normal and symmetric with the symptomatic side. No evidence of thrombus. Normal compressibility. Internal Jugular Vein: No evidence of thrombus. Normal compressibility, respiratory phasicity and response to augmentation. Subclavian Vein: No evidence of thrombus. Normal compressibility, respiratory phasicity and response to augmentation. Axillary Vein: No evidence of thrombus. Normal compressibility, respiratory phasicity and response to augmentation. Cephalic Vein: No evidence of thrombus. Normal compressibility, respiratory phasicity and response to augmentation. Basilic Vein: No evidence of thrombus. Normal compressibility, respiratory phasicity and response to augmentation. Brachial Veins: No evidence of thrombus. Normal compressibility, respiratory phasicity and response to augmentation. Radial Veins: No evidence of thrombus. Normal compressibility, respiratory phasicity and response to augmentation.  Ulnar Veins: No evidence of thrombus. Normal compressibility, respiratory phasicity and response to augmentation. Other Findings:  None visualized. IMPRESSION: Sonographic survey of the right upper extremity negative for DVT. Electronically Signed   By: Corrie Mckusick D.O.   On: 03/09/2018 12:22        Scheduled Meds: . allopurinol  50 mg Oral Daily  . aspirin EC  81 mg Oral Daily  . citalopram  20 mg Oral Daily  . colesevelam  625 mg Oral BID WC  . dicyclomine  10 mg Oral TID AC  . feeding supplement (GLUCERNA SHAKE)  237 mL Oral TID BM  . fluconazole  100 mg Oral Daily  . gabapentin  100 mg Oral TID  . heparin  5,000 Units Subcutaneous Q8H  . insulin aspart  0-20 Units Subcutaneous Q4H  . insulin aspart  15 Units Subcutaneous TID WC  . insulin detemir  60 Units Subcutaneous Daily  . methylPREDNISolone (SOLU-MEDROL) injection  20 mg Intravenous Q12H  . metoprolol tartrate  100 mg Oral BID  . multivitamin with minerals  1 tablet Oral Daily  . oxybutynin  2.5 mg Oral BID  . pantoprazole  40 mg Oral Daily  . sodium chloride flush  10-40 mL Intracatheter Q12H   Continuous Infusions: . cefTRIAXone (ROCEPHIN)  IV 2 g (03/10/18 1019)     LOS: 6 days    Time spent: 30 minutes    Adrienne Delay Darleen Crocker, DO [facility] Pager [phone]  If 7PM-7AM, please contact night-coverage [email or web address] Password [facility] 03/10/2018, 12:17 PM

## 2018-03-10 NOTE — Care Management Important Message (Signed)
Important Message  Patient Details  Name: Heather [name] MRN: [id number] Date of Birth: [birth year]   Medicare Important Message Given:  Yes    Shelda Altes 03/10/2018, 12:53 PM

## 2018-03-10 NOTE — Progress Notes (Signed)
Inpatient Diabetes Program Recommendations  AACE/ADA: New Consensus Statement on Inpatient Glycemic Control (2015)  Target Ranges:  Prepandial:   less than 140 mg/dL      Peak postprandial:   less than 180 mg/dL (1-2 hours)      Critically ill patients:  140 - 180 mg/dL   Results for Maher, Marisue A "[name]" (MRN [id number]) as of 03/10/2018 10:34  Ref. Range 03/09/2018 07:48 03/09/2018 11:49 03/09/2018 16:57 03/09/2018 20:12 03/09/2018 23:52 03/10/2018 04:09 03/10/2018 07:51  Glucose-Capillary Latest Ref Range: 65 - 99 mg/dL 217 (H) 322 (H) 488 (H) 456 (H) 428 (H) 399 (H) 391 (H)   Review of Glycemic Control  Diabetes history: DM2 Outpatient Diabetes medications: Levemir 80 units daily, Humalog 21-40 units TID (per sliding scale), Victoza 1.8 mg daily Current orders for Inpatient glycemic control: Levemir 60 units daily, Novolog 10 units TID with meals, Novolog 0-20 units Q4H; Solumedrol 20 mg Q12H  Inpatient Diabetes Program Recommendations:  Insulin - Basal: Noted Levemir increased from 40 to 60 units daily. Insulin - Meal Coverage: If steroids are continued and post prandial glucose continues to be elevated, please consider increasing meal coverage to Novolog 15 units TID with meals.  Thanks, Barnie Alderman, RN, MSN, CDE Diabetes Coordinator Inpatient Diabetes Program [phone] (Team Pager from 8am to 5pm)

## 2018-03-11 LAB — GLUCOSE, CAPILLARY
GLUCOSE-CAPILLARY: 245 mg/dL — AB (ref 65–99)
Glucose-Capillary: 199 mg/dL — ABNORMAL HIGH (ref 65–99)
Glucose-Capillary: 203 mg/dL — ABNORMAL HIGH (ref 65–99)
Glucose-Capillary: 230 mg/dL — ABNORMAL HIGH (ref 65–99)
Glucose-Capillary: 278 mg/dL — ABNORMAL HIGH (ref 65–99)
Glucose-Capillary: 309 mg/dL — ABNORMAL HIGH (ref 65–99)

## 2018-03-11 LAB — LIPASE, BLOOD: Lipase: 80 U/L — ABNORMAL HIGH (ref 11–51)

## 2018-03-11 LAB — CBC
HCT: 26 % — ABNORMAL LOW (ref 36.0–46.0)
Hemoglobin: 8.5 g/dL — ABNORMAL LOW (ref 12.0–15.0)
MCH: 29.8 pg (ref 26.0–34.0)
MCHC: 32.7 g/dL (ref 30.0–36.0)
MCV: 91.2 fL (ref 78.0–100.0)
PLATELETS: 207 10*3/uL (ref 150–400)
RBC: 2.85 MIL/uL — ABNORMAL LOW (ref 3.87–5.11)
RDW: 15 % (ref 11.5–15.5)
WBC: 16.5 10*3/uL — AB (ref 4.0–10.5)

## 2018-03-11 LAB — BASIC METABOLIC PANEL
Anion gap: 7 (ref 5–15)
BUN: 42 mg/dL — AB (ref 6–20)
CO2: 20 mmol/L — ABNORMAL LOW (ref 22–32)
CREATININE: 1.54 mg/dL — AB (ref 0.44–1.00)
Calcium: 9.2 mg/dL (ref 8.9–10.3)
Chloride: 108 mmol/L (ref 101–111)
GFR calc Af Amer: 40 mL/min — ABNORMAL LOW (ref >60)
GFR calc non Af Amer: 34 mL/min — ABNORMAL LOW (ref >60)
GLUCOSE: 217 mg/dL — AB (ref 65–99)
POTASSIUM: 5.9 mmol/L — AB (ref 3.5–5.1)
Sodium: 135 mmol/L (ref 135–145)

## 2018-03-11 MED ORDER — PREDNISONE 20 MG PO TABS: 40.0000 mg | ORAL_TABLET | Freq: Every day | ORAL | Status: DC

## 2018-03-11 MED ORDER — DICYCLOMINE HCL 10 MG PO CAPS
10.0000 mg | ORAL_CAPSULE | Freq: Three times a day (TID) | ORAL | Status: DC
  Administered 2018-03-11 – 2018-03-13 (×9): 10 mg via ORAL
  Filled 2018-03-11 (×9): qty 1

## 2018-03-11 NOTE — Progress Notes (Signed)
Subjective: Patient states she had 4 loose stools overnight, it was "all over the floor". I discussed with nursing staff who state she has NO bowel movements overnight and only 2 yesterday after kayexalate. Patient noting urgency to have BM, abdominal cramping. Asked why there was a "carnival" next door in the patient's room. Putting butter in her coffee. Alert and oriented to person, situation, year, but she seems to be intermittently confused and limited historian.   Objective: Vital signs in last 24 hours: Temp:  [97.7 F (36.5 C)] 97.7 F (36.5 C) (05/14 0514) Pulse Rate:  [70-91] 70 (05/14 0514) Resp:  [18-20] 18 (05/14 0514) BP: (151-163)/(74-78) 151/74 (05/14 0514) SpO2:  [100 %] 100 % (05/14 0514) Last BM Date: 03/10/18 General:   Alert and oriented, pleasant Head:  Normocephalic and atraumatic. Abdomen:  Bowel sounds present, soft, non-tender, non-distended. No HSM or hernias noted. No rebound or guarding. No masses appreciated  Extremities:  Without  edema. Neurologic:  Alert and  oriented x4 Psych:  Alert and cooperative. Normal mood and affect.  Intake/Output from previous day: 05/13 0701 - 05/14 0700 In: 680 [P.O.:680] Out: 450 [Urine:450] Intake/Output this shift: No intake/output data recorded.  Lab Results: Recent Labs    03/09/18 0624 03/10/18 0604 03/11/18 0427  WBC 7.7 18.5* 16.5*  HGB 7.8* 9.0* 8.5*  HCT 23.7* 27.8* 26.0*  PLT 150 185 207   BMET Recent Labs    03/09/18 0624 03/10/18 0604 03/11/18 0427  NA 134* 131* 135  K 5.2* 6.2* 5.9*  CL 111 106 108  CO2 16* 15* 20*  GLUCOSE 261* 397* 217*  BUN 27* 38* 42*  CREATININE 1.61* 1.90* 1.54*  CALCIUM 8.7* 9.4 9.2   LFT Recent Labs    03/09/18 0624  ALBUMIN 2.8*   Lab Results  Component Value Date   LIPASE 80 (H) 03/11/2018  \  Studies/Results: US Venous Img Upper Uni Right  Result Date: 03/09/2018 CLINICAL DATA:  67 year old female with a history of arm swelling EXAM: RIGHT  UPPER EXTREMITY VENOUS DOPPLER ULTRASOUND TECHNIQUE: Gray-scale sonography with graded compression, as well as color Doppler and duplex ultrasound were performed to evaluate the upper extremity deep venous system from the level of the subclavian vein and including the jugular, axillary, basilic, radial, ulnar and upper cephalic vein. Spectral Doppler was utilized to evaluate flow at rest and with distal augmentation maneuvers. COMPARISON:  None. FINDINGS: Contralateral Subclavian Vein: Respiratory phasicity is normal and symmetric with the symptomatic side. No evidence of thrombus. Normal compressibility. Internal Jugular Vein: No evidence of thrombus. Normal compressibility, respiratory phasicity and response to augmentation. Subclavian Vein: No evidence of thrombus. Normal compressibility, respiratory phasicity and response to augmentation. Axillary Vein: No evidence of thrombus. Normal compressibility, respiratory phasicity and response to augmentation. Cephalic Vein: No evidence of thrombus. Normal compressibility, respiratory phasicity and response to augmentation. Basilic Vein: No evidence of thrombus. Normal compressibility, respiratory phasicity and response to augmentation. Brachial Veins: No evidence of thrombus. Normal compressibility, respiratory phasicity and response to augmentation. Radial Veins: No evidence of thrombus. Normal compressibility, respiratory phasicity and response to augmentation. Ulnar Veins: No evidence of thrombus. Normal compressibility, respiratory phasicity and response to augmentation. Other Findings:  None visualized. IMPRESSION: Sonographic survey of the right upper extremity negative for DVT. Electronically Signed   By: Corrie Mckusick D.O.   On: 03/09/2018 12:22    Assessment: 67 year old female admitted with N/V/D, with GI pathogen panel completed noting Salmonella on 5/9. No Cdiff on file (not  included in GI pathogen panel). History of chronic diarrhea, followed by Dr.  Carlean Purl in [place]: multiple outpatient therapies trialed as noted in consultation dated 03/10/18. Consideration for pancreatic insufficiency was raised in the past per GI in [place]. Clinically, she had improved from a diarrheal standpoint per nursing staff as of several days ago. Yesterday, she  received kayexalate with subsequent recurrent loose stool. This morning, she notes persistent loose stool overnight although nursing staff state she has had none. It must be noted, she is intermittently confused, and it is unclear exactly what kind of stool output she is having. Unknown baseline for IBS. 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 Unless able to document per nursing staff she is having profuse, watery stools, would not order CDI testing. Discontinue Rocephin.    Elevated lipase: pancreas unremarkable on CT without contrast. Prior concern for pancreatitis raised in the past as outpatient, with MRCP in March 2018 normal. Lipase can also be elevated in setting of renal disease (creatinine was 9 on admission) and other etiologies. Clinically, she is doing well. Lipase much improved this morning.  Recommend further evaluation as outpatient with primary GI in [place].  N/V: in setting of acute illness, salmonella. Prior delayed gastric emptying documented on GES in 2012 at [place]. Suspect diabetic gastoparesis in setting of poorly controlled diabetes. There is not a recent A1c on file, but it has been as high as 12 in Jan 2018 (most recently 8.06 Mar 2017). Previously on Reglan. Zofran scheduled. PPI before breakfast daily. Doing well from upper GI standpoint.   Anemia: multifactorial in setting of renal disease. No overt GI bleeding. Colonoscopy/EGD on file from 2017. Further evaluation as outpatient.  Irregular liver contour on recent CT: in setting of fatty liver. Further evaluation as outpatient. Spleen normal, no thrombocytopenia.  Plan: Discussed with [name]: will  request nursing staff and nurse techs to document any stool output in progress notes Continue supportive measures with Bentyl Continue scheduled Zofran and PPI Outpatient follow-up with [facility] for chronic issues   Annitta Needs, PhD, ANP-BC [facility]     LOS: 7 days    03/11/2018, 9:26 AM

## 2018-03-11 NOTE — Progress Notes (Signed)
Inpatient Diabetes Program Recommendations  AACE/ADA: New Consensus Statement on Inpatient Glycemic Control (2015)  Target Ranges:  Prepandial:   less than 140 mg/dL      Peak postprandial:   less than 180 mg/dL (1-2 hours)      Critically ill patients:  140 - 180 mg/dL   Results for Heather [name], Heather [name] "[name]" (MRN [id number]) as of 03/11/2018 13:35  Ref. Range 03/10/2018 07:51 03/10/2018 11:27 03/10/2018 16:23 03/10/2018 20:27 03/11/2018 00:26 03/11/2018 05:15 03/11/2018 08:09 03/11/2018 11:50  Glucose-Capillary Latest Ref Range: 65 - 99 mg/dL 391 (H) 328 (H) 331 (H) 192 (H) 203 (H) 199 (H) 230 (H) 245 (H)   Review of Glycemic Control  Diabetes history: DM2 Outpatient Diabetes medications: Levemir 80 units daily, Humalog21-40 unitsTID (per sliding scale), Victoza 1.8 mg daily Current orders for Inpatient glycemic control: Levemir 60 units daily, Novolog 15 units TID with meals, Novolog 0-20 units Q4H; Solumedrol 20 mg Q12H  Inpatient Diabetes Program Recommendations:  Insulin - Basal: If steroids are continued as ordered, please consider increasing Levemir to 65 units daily. Insulin - Meal Coverage: If steroids are continued please consider increasing meal coverage to Novolog 20 units TID with meals.  Thanks, Barnie Alderman, RN, MSN, CDE Diabetes Coordinator Inpatient Diabetes Program [phone] (Team Pager from 8am to 5pm)

## 2018-03-11 NOTE — Care Management Note (Signed)
Case Management Note  Patient Details  Name: BRANDIN DILDAY MRN: [id number] Date of Birth: 1951-10-28  If discussed at Long Length of Stay Meetings, dates discussed:  03/11/2018  Additional Comments:  Sherald Barge, RN 03/11/2018, 12:13 PM

## 2018-03-11 NOTE — Progress Notes (Signed)
PROGRESS NOTE    Heather [name]  SJG:[id number] DOB: 12-Oct-1951 DOA: 03/03/2018 PCP: [name], MD   Brief Narrative:   Heather [name]  is a 67 y.o. female, with history of chronic kidney disease stage III, diabetes mellitus, hiatal hernia came to hospital with complaints of vomiting and diarrhea since Thursday.    She has been admitted with acute pancreatitis as well as acute kidney injury suspected secondary to dehydration from vomiting and diarrhea.  Nephrology has been consulted for evaluation and management of AKI which has now resolved.  She continues to have ongoing diarrhea and crampy abdominal pain with little to no improvement.  She has also developed acute gout to her right elbow which has been poorly responsive to IV steroid treatment.  She is now experiencing what appears to be steroid-induced psychosis as well.  She has also had complications of hyperglycemia with steroid use.   Assessment & Plan:   Active Problems:   AKI (acute kidney injury) ([place])   Protein-calorie malnutrition, severe   Elevated lipase   Gastroenteritis   1. Acute pancreatitis-resolved.  She has had multiple prior episodes of this and does follow with gastroenterology in the outpatient setting.  She is now tolerating diet with no further vomiting or epigastric pain, but does have some mild nausea. 2. Salmonella gastroenteritis-persistent and poorly responsive to treatment.  Continue supportive measures with Imodium and Bentyl as discussed with GI.  Rocephin has been given for 5 days and will be discontinued at this time as it appears that this is not helping her.  Additionally she is a poor historian due to what sounds like some steroid-induced psychosis and claims to keep having loose bowel movements, but nursing staff deny this.  Will record bowel movement frequency carefully. 3. R arm suspected acute gout flare-improved.  Continue allopurinol, but will discontinue IV steroid today on account of  what sounds like worsening steroid-induced psychosis. 4. Steroid-induced psychosis.  Continue to monitor with neurochecks and discontinue steroid use today. 5. AKI secondary to dehydration-improving.  Nephrology has signed off at this point, but patient has had an uptrending creatinine for which I would recommend continuing IV fluid at this time.  Continue to monitor with repeat labs and avoid nephrotoxic agents. 6. Diabetes mellitus with mild to moderate hyperglycemia-stabilized.  Continue sliding scale insulin and restart portion of home Levemir with increase to 60 units daily yesterday; continue to monitor.  Mealtime insulin added as well.  Discontinuing steroids today which should help. 7. Vaginal yeast infection.  Started on Diflucan per pharmacy day 5/7.  Currently on oxybutynin for bladder spasms. 8. GERD.  Restarted on PPI.   DVT prophylaxis:Heparin Code Status: DNR Family Communication: None at bedside Disposition Plan: Continue supportive care with assistance from GI and monitor for frequency of bowel movements as she is currently a poor historian and reporting this.  Discontinue IV steroids which should help with blood glucose as well as psychosis.  Discharge soon once GI symptoms appear to have resolved.   Consultants:   Nephrology  Gastroenterology  Procedures:   None  Antimicrobials:   Rocephin d/c 5/14  Diflucan   Subjective: Patient seen and evaluated today and continues to complain of ongoing loose bowel movements which she states she had 10 episodes yesterday.  She appears quite shaky and somewhat delirious as well.  No other acute events noted otherwise overnight.  Objective: Vitals:   03/10/18 0412 03/10/18 1505 03/10/18 2029 03/11/18 0514  BP: (!) 154/80 (!) 163/75 (!) 158/78 Marland Kitchen)  151/74  Pulse: 93 91 84 70  Resp: 14 20 18 18   Temp: 97.8 F (36.6 C)  97.7 F (36.5 C) 97.7 F (36.5 C)  TempSrc: Oral  Oral Oral  SpO2: 98% 100% 100% 100%  Weight:        Height:        Intake/Output Summary (Last 24 hours) at 03/11/2018 1403 Last data filed at 03/11/2018 0912 Gross per 24 hour  Intake 1780 ml  Output 450 ml  Net 1330 ml   Filed Weights   03/03/18 2342 03/04/18 0800  Weight: 69.9 kg (154 lb) 64.8 kg (142 lb 13.7 oz)    Examination:  General exam: Appears anxious Respiratory system: Clear to auscultation. Respiratory effort normal. Cardiovascular system: S1 & S2 heard, RRR. No JVD, murmurs, rubs, gallops or clicks. No pedal edema. Gastrointestinal system: Abdomen is nondistended, soft and nontender. No organomegaly or masses felt. Normal bowel sounds heard. Central nervous system: Alert and oriented. No focal neurological deficits. Extremities: Symmetric 5 x 5 power.  Right elbow with tenderness to palpation and mild edema noted.  No erythema or ecchymosis or warmth. Skin: No rashes, lesions or ulcers     Data Reviewed: I have personally reviewed following labs and imaging studies  CBC: Recent Labs  Lab 03/07/18 0536 03/08/18 0645 03/09/18 0624 03/10/18 0604 03/11/18 0427  WBC 10.6* 9.6 7.7 18.5* 16.5*  HGB 9.5* 9.2* 7.8* 9.0* 8.5*  HCT 27.9* 27.1* 23.7* 27.8* 26.0*  MCV 87.2 89.4 90.8 91.4 91.2  PLT 183 178 150 185 397   Basic Metabolic Panel: Recent Labs  Lab 03/05/18 0550  03/07/18 0536 03/08/18 0645 03/09/18 0624 03/10/18 0604 03/11/18 0427  NA 134*  134*   < > 136  135 138 134* 131* 135  K 3.0*  3.0*   < > 4.2  4.2 5.0 5.2* 6.2* 5.9*  CL 99*  99*   < > 108  107 112* 111 106 108  CO2 24  23   < > 20*  20* 18* 16* 15* 20*  GLUCOSE 169*  170*   < > 207*  205* 195* 261* 397* 217*  BUN 70*  71*   < > 35*  35* 27* 27* 38* 42*  CREATININE 3.91*  3.91*   < > 1.61*  1.55* 1.45* 1.61* 1.90* 1.54*  CALCIUM 7.4*  7.5*   < > 8.8*  8.8* 9.0 8.7* 9.4 9.2  PHOS 2.7  --  2.0* 1.8* 2.4*  --   --    < > = values in this interval not displayed.   GFR: Estimated Creatinine Clearance: 33 mL/min (A) (by C-G  formula based on SCr of 1.54 mg/dL (H)). Liver Function Tests: Recent Labs  Lab 03/05/18 0550 03/06/18 0552 03/07/18 0536 03/08/18 0645 03/09/18 0624  AST 15 18  --   --   --   ALT 13* 16  --   --   --   ALKPHOS 68 69  --   --   --   BILITOT 0.9 0.4  --   --   --   PROT 5.9* 6.0*  --   --   --   ALBUMIN 2.9*  2.9* 3.0* 3.1* 3.0* 2.8*   Recent Labs  Lab 03/05/18 0550 03/11/18 0427  LIPASE 1,151* 80*   No results for input(s): AMMONIA in the last 168 hours. Coagulation Profile: No results for input(s): INR, PROTIME in the last 168 hours. Cardiac Enzymes: No results for input(s): CKTOTAL, CKMB,  CKMBINDEX, TROPONINI in the last 168 hours. BNP (last 3 results) No results for input(s): PROBNP in the last 8760 hours. HbA1C: No results for input(s): HGBA1C in the last 72 hours. CBG: Recent Labs  Lab 03/10/18 2027 03/11/18 0026 03/11/18 0515 03/11/18 0809 03/11/18 1150  GLUCAP 192* 203* 199* 230* 245*   Lipid Profile: No results for input(s): CHOL, HDL, LDLCALC, TRIG, CHOLHDL, LDLDIRECT in the last 72 hours. Thyroid Function Tests: No results for input(s): TSH, T4TOTAL, FREET4, T3FREE, THYROIDAB in the last 72 hours. Anemia Panel: No results for input(s): VITAMINB12, FOLATE, FERRITIN, TIBC, IRON, RETICCTPCT in the last 72 hours. Sepsis Labs: No results for input(s): PROCALCITON, LATICACIDVEN in the last 168 hours.  Recent Results (from the past 240 hour(s))  Gastrointestinal Panel by PCR , Stool     Status: Abnormal   Collection Time: 03/04/18  4:11 PM  Result Value Ref Range Status   Campylobacter species NOT DETECTED NOT DETECTED Final   Plesimonas shigelloides NOT DETECTED NOT DETECTED Final   Salmonella species DETECTED (A) NOT DETECTED Final    Comment: RESULT CALLED TO, READ BACK BY AND VERIFIED WITH: BRITTANY FOLEY 03/06/18 1518 KLW    Yersinia enterocolitica NOT DETECTED NOT DETECTED Final   Vibrio species NOT DETECTED NOT DETECTED Final   Vibrio cholerae  NOT DETECTED NOT DETECTED Final   Enteroaggregative E coli (EAEC) NOT DETECTED NOT DETECTED Final   Enteropathogenic E coli (EPEC) NOT DETECTED NOT DETECTED Final   Enterotoxigenic E coli (ETEC) NOT DETECTED NOT DETECTED Final   Shiga like toxin producing E coli (STEC) NOT DETECTED NOT DETECTED Final   Shigella/Enteroinvasive E coli (EIEC) NOT DETECTED NOT DETECTED Final   Cryptosporidium NOT DETECTED NOT DETECTED Final   Cyclospora cayetanensis NOT DETECTED NOT DETECTED Final   Entamoeba histolytica NOT DETECTED NOT DETECTED Final   Giardia lamblia NOT DETECTED NOT DETECTED Final   Adenovirus F40/41 NOT DETECTED NOT DETECTED Final   Astrovirus NOT DETECTED NOT DETECTED Final   Norovirus GI/GII NOT DETECTED NOT DETECTED Final   Rotavirus A NOT DETECTED NOT DETECTED Final   Sapovirus (I, II, IV, and V) NOT DETECTED NOT DETECTED Final    Comment: Performed at [facility], [address], [place], [place] [address]         Radiology Studies: No results found.      Scheduled Meds: . allopurinol  50 mg Oral Daily  . aspirin EC  81 mg Oral Daily  . citalopram  20 mg Oral Daily  . colesevelam  1,250 mg Oral BID WC  . dicyclomine  10 mg Oral TID AC  . feeding supplement (GLUCERNA SHAKE)  237 mL Oral TID BM  . fluconazole  100 mg Oral Daily  . gabapentin  100 mg Oral TID  . heparin  5,000 Units Subcutaneous Q8H  . insulin aspart  0-20 Units Subcutaneous Q4H  . insulin aspart  15 Units Subcutaneous TID WC  . insulin detemir  60 Units Subcutaneous Daily  . metoprolol tartrate  100 mg Oral BID  . multivitamin with minerals  1 tablet Oral Daily  . ondansetron  4 mg Oral TID AC & HS  . oxybutynin  2.5 mg Oral BID  . pantoprazole  40 mg Oral QAC breakfast  . sodium chloride flush  10-40 mL Intracatheter Q12H   Continuous Infusions: . sodium chloride 75 mL/hr at 03/10/18 1640     LOS: 7 days    Time spent: 30 minutes    Richmond Coldren D  Manuella Ghazi, DO [facility] Pager [phone]  If 7PM-7AM, please contact night-coverage [email or web address] Password [email or web address] 03/11/2018, 2:03 PM

## 2018-03-11 NOTE — Plan of Care (Signed)
Good intake elevated blood glucose

## 2018-03-12 DIAGNOSIS — A029 Salmonella infection, unspecified: Secondary | ICD-10-CM

## 2018-03-12 DIAGNOSIS — E1165 Type 2 diabetes mellitus with hyperglycemia: Secondary | ICD-10-CM

## 2018-03-12 DIAGNOSIS — R197 Diarrhea, unspecified: Secondary | ICD-10-CM

## 2018-03-12 DIAGNOSIS — M109 Gout, unspecified: Secondary | ICD-10-CM

## 2018-03-12 DIAGNOSIS — Z794 Long term (current) use of insulin: Secondary | ICD-10-CM

## 2018-03-12 LAB — BASIC METABOLIC PANEL
ANION GAP: 4 — AB (ref 5–15)
BUN: 47 mg/dL — ABNORMAL HIGH (ref 6–20)
CO2: 18 mmol/L — AB (ref 22–32)
Calcium: 9.3 mg/dL (ref 8.9–10.3)
Chloride: 113 mmol/L — ABNORMAL HIGH (ref 101–111)
Creatinine, Ser: 1.52 mg/dL — ABNORMAL HIGH (ref 0.44–1.00)
GFR, EST AFRICAN AMERICAN: 40 mL/min — AB (ref >60)
GFR, EST NON AFRICAN AMERICAN: 35 mL/min — AB (ref >60)
Glucose, Bld: 131 mg/dL — ABNORMAL HIGH (ref 65–99)
Potassium: 5.6 mmol/L — ABNORMAL HIGH (ref 3.5–5.1)
Sodium: 135 mmol/L (ref 135–145)

## 2018-03-12 LAB — CBC
HEMATOCRIT: 25.4 % — AB (ref 36.0–46.0)
Hemoglobin: 8 g/dL — ABNORMAL LOW (ref 12.0–15.0)
MCH: 29.4 pg (ref 26.0–34.0)
MCHC: 31.5 g/dL (ref 30.0–36.0)
MCV: 93.4 fL (ref 78.0–100.0)
Platelets: 226 10*3/uL (ref 150–400)
RBC: 2.72 MIL/uL — ABNORMAL LOW (ref 3.87–5.11)
RDW: 15.2 % (ref 11.5–15.5)
WBC: 14.4 10*3/uL — AB (ref 4.0–10.5)

## 2018-03-12 LAB — GLUCOSE, CAPILLARY
GLUCOSE-CAPILLARY: 48 mg/dL — AB (ref 65–99)
GLUCOSE-CAPILLARY: 130 mg/dL — AB (ref 65–99)
Glucose-Capillary: 126 mg/dL — ABNORMAL HIGH (ref 65–99)
Glucose-Capillary: 139 mg/dL — ABNORMAL HIGH (ref 65–99)
Glucose-Capillary: 220 mg/dL — ABNORMAL HIGH (ref 65–99)
Glucose-Capillary: 48 mg/dL — ABNORMAL LOW (ref 65–99)
Glucose-Capillary: 88 mg/dL (ref 65–99)
Glucose-Capillary: 98 mg/dL (ref 65–99)

## 2018-03-12 MED ORDER — INSULIN DETEMIR 100 UNIT/ML ~~LOC~~ SOLN
55.0000 [IU] | Freq: Every day | SUBCUTANEOUS | Status: DC
  Administered 2018-03-13: 55 [IU] via SUBCUTANEOUS
  Filled 2018-03-12 (×2): qty 0.55

## 2018-03-12 MED ORDER — DEXTROSE 50 % IV SOLN
INTRAVENOUS | Status: AC
  Administered 2018-03-12: 50 mL via INTRAVENOUS
  Filled 2018-03-12: qty 50

## 2018-03-12 NOTE — Progress Notes (Signed)
Hypoglycemic Event  CBG: 48  Treatment: 1 Coke and dinner tray  Symptoms: none  Follow-up CBG: Time:1750 CBG Result:48 Again  Treatment #2: Gave IV D50% 25 grams  Follow-up #2 CBG Time: 8315  CBG Result: 139  Possible Reasons for Event: Pt received 15 units of Novolog at lunch time.   Comments/MD notified: [place], D/C'd 15 units of Novolog meal coverage and decreased levemir insulin to 55 units daily  Shawndrea Rutkowski N Maliki Gignac

## 2018-03-12 NOTE — Progress Notes (Signed)
Subjective:  Patient sitting up in bed, appears comfortable. States no BM since yesterday. Tolerating diet. Insisting on more butter and sugar for her food. No N/V. Abdominal pain improved. Difficult historian.   Objective: Vital signs in last 24 hours: Temp:  [97.9 F (36.6 C)-98.5 F (36.9 C)] 98 F (36.7 C) (05/15 0557) Pulse Rate:  [59-72] 59 (05/15 0557) Resp:  [18-20] 18 (05/15 0557) BP: (145-160)/(66-80) 149/66 (05/15 0557) SpO2:  [98 %-100 %] 99 % (05/15 0557) Last BM Date: 03/11/18 General:   Alert,  Well-developed, well-nourished, pleasant and cooperative in NAD. Very talkative.  Head:  Normocephalic and atraumatic. Eyes:  Sclera clear, no icterus. Abdomen:  Soft, nontender and nondistended.   Normal bowel sounds, without guarding, and without rebound.   Extremities:  Without clubbing, deformity. Trace pedal edema bilaterally. Neurologic:  Alert and  oriented x4;  grossly normal neurologically. Skin:  Intact without significant lesions or rashes. Psych:  Alert and cooperative. Normal mood and affect.  Intake/Output from previous day: 05/14 0701 - 05/15 0700 In: 3164.8 [P.O.:636; I.V.:2428.8; IV Piggyback:100] Out: 900 [Urine:900] Intake/Output this shift: No intake/output data recorded.  Lab Results: CBC Recent Labs    03/10/18 0604 03/11/18 0427 03/12/18 0456  WBC 18.5* 16.5* 14.4*  HGB 9.0* 8.5* 8.0*  HCT 27.8* 26.0* 25.4*  MCV 91.4 91.2 93.4  PLT 185 207 226   BMET Recent Labs    03/10/18 0604 03/11/18 0427 03/12/18 0456  NA 131* 135 135  K 6.2* 5.9* 5.6*  CL 106 108 113*  CO2 15* 20* 18*  GLUCOSE 397* 217* 131*  BUN 38* 42* 47*  CREATININE 1.90* 1.54* 1.52*  CALCIUM 9.4 9.2 9.3   LFTs No results for input(s): BILITOT, BILIDIR, IBILI, ALKPHOS, AST, ALT, PROT, ALBUMIN in the last 72 hours. Recent Labs    03/11/18 0427  LIPASE 80*   PT/INR No results for input(s): LABPROT, INR in the last 72 hours.    Imaging Studies: Ct Abdomen Pelvis  Wo Contrast  Result Date: 03/04/2018 CLINICAL DATA:  67 year old female with nausea vomiting abdominal pain and chills. EXAM: CT ABDOMEN AND PELVIS WITHOUT CONTRAST TECHNIQUE: Multidetector CT imaging of the abdomen and pelvis was performed following the standard protocol without IV contrast. COMPARISON:  Abdominal CT dated 01/08/2018 FINDINGS: Evaluation of this exam is limited in the absence of intravenous contrast. Lower chest: The visualized lung bases are clear. There is coronary vascular calcification. No intra-abdominal free air or free fluid. Hepatobiliary: There is fatty infiltration of the liver. Mild irregularity of the liver contour may represent early changes of cirrhosis. Clinical correlation is recommended. No intrahepatic biliary ductal dilatation. Cholecystectomy. Pancreas: Unremarkable. No pancreatic ductal dilatation or surrounding inflammatory changes. Spleen: Normal in size without focal abnormality. Adrenals/Urinary Tract: Adrenal glands are unremarkable. Kidneys are normal, without renal calculi, focal lesion, or hydronephrosis. Bladder is unremarkable. Stomach/Bowel: There is thickened appearance of the stomach which may be related to underdistention or represent gastritis. Clinical correlation is recommended. Normal caliber fluid-filled loops of small bowel may be physiologic or represent enteritis. Loose stool noted throughout the colon compatible with diarrheal state. There is no bowel obstruction. The appendix is not visualized with certainty. No inflammatory changes identified in the right lower quadrant. Vascular/Lymphatic: Advanced aortoiliac atherosclerotic disease. The abdominal aorta and IVC are otherwise grossly unremarkable on this noncontrast CT. No portal venous gas. There is no adenopathy. Reproductive: Hysterectomy. There is a 12 mm rounded low attenuating structure in the region of the right adnexa along the  round ligament similar to prior CT. Ultrasound may provide better  evaluation. Other: None Musculoskeletal: Degenerative changes of the spine. No acute osseous pathology. IMPRESSION: 1. Diarrheal state with possible gastroenteritis. Clinical correlation is recommended. No bowel obstruction. 2. Mild fatty liver. 3.  Aortic Atherosclerosis (ICD10-I70.0). Electronically Signed   By: Anner Crete M.D.   On: 03/04/2018 03:18   US Renal  Result Date: 03/04/2018 CLINICAL DATA:  Acute renal insufficiency, diabetes, hyperlipidemia EXAM: RENAL / URINARY TRACT ULTRASOUND COMPLETE COMPARISON:  CT abdomen pelvis of 03/04/2017 FINDINGS: Right Kidney: Length: 10.2 cm. No hydronephrosis is seen. The echogenicity of the renal parenchyma is increased consistent with chronic renal medical disease. Left Kidney: Length: 10.2 cm. No hydronephrosis is seen. There is a small exophytic cyst from upper pole posteriorly measuring 1 cm in diameter. The echogenicity of the renal parenchyma is increased consistent with chronic renal medical disease. Bladder: The urinary bladder is not well distended, and ureteral jets were not visualized. IMPRESSION: 1. No hydronephrosis. 2. Echogenic renal parenchyma bilaterally consistent with chronic renal medical disease. 3. Incidental note of echogenic liver parenchyma consistent with hepatic steatosis. Electronically Signed   By: Ivar Drape M.D.   On: 03/04/2018 11:39   US Venous Img Upper Uni Right  Result Date: 03/09/2018 CLINICAL DATA:  67 year old female with a history of arm swelling EXAM: RIGHT UPPER EXTREMITY VENOUS DOPPLER ULTRASOUND TECHNIQUE: Gray-scale sonography with graded compression, as well as color Doppler and duplex ultrasound were performed to evaluate the upper extremity deep venous system from the level of the subclavian vein and including the jugular, axillary, basilic, radial, ulnar and upper cephalic vein. Spectral Doppler was utilized to evaluate flow at rest and with distal augmentation maneuvers. COMPARISON:  None. FINDINGS:  Contralateral Subclavian Vein: Respiratory phasicity is normal and symmetric with the symptomatic side. No evidence of thrombus. Normal compressibility. Internal Jugular Vein: No evidence of thrombus. Normal compressibility, respiratory phasicity and response to augmentation. Subclavian Vein: No evidence of thrombus. Normal compressibility, respiratory phasicity and response to augmentation. Axillary Vein: No evidence of thrombus. Normal compressibility, respiratory phasicity and response to augmentation. Cephalic Vein: No evidence of thrombus. Normal compressibility, respiratory phasicity and response to augmentation. Basilic Vein: No evidence of thrombus. Normal compressibility, respiratory phasicity and response to augmentation. Brachial Veins: No evidence of thrombus. Normal compressibility, respiratory phasicity and response to augmentation. Radial Veins: No evidence of thrombus. Normal compressibility, respiratory phasicity and response to augmentation. Ulnar Veins: No evidence of thrombus. Normal compressibility, respiratory phasicity and response to augmentation. Other Findings:  None visualized. IMPRESSION: Sonographic survey of the right upper extremity negative for DVT. Electronically Signed   By: Corrie Mckusick D.O.   On: 03/09/2018 12:22  [2 weeks]  Assessment:  67 year old female admitted with N/V/D, with GI pathogen panel completed noting Salmonella on 5/9. No Cdiff on file (not included in GI pathogen panel). History of chronic diarrhea, followed by Dr. Carlean Purl in [place]: multiple outpatient therapies tried as noted in consultation dated 03/10/18. Consideration for pancreatic insufficiency was raised in the past per GI in [place]. Clinically, she had improved from a diarrheal standpoint per nursing staff as of several days ago. Yesterday, she had some recurrent loose stool in setting of kayexalate. Patient reports improved in last 12-24 hours. Unknown baseline for IBS. 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 Unless able to document per nursing staff she is having profuse, watery stools, would not order CDI testing.   Elevated lipase: pancreas unremarkable on CT without contrast.  Prior concern for pancreatitis raised in the past as outpatient, with MRCP in March 2018 normal. Clinically, she is doing well. Lipase much improved this morning.  Recommend further evaluation as outpatient with primary GI in [place].  N/V: in setting of acute illness, salmonella. Prior delayed gastric emptying documented on GES in 2012 at [facility]. Suspect diabetic gastoparesis in setting of poorly controlled diabetes. There is not a recent A1c on file, but it has been as high as 12 in Jan 2018 (most recently 8.06 Mar 2017). Previously on Reglan. Zofran scheduled. PPI before breakfast daily. Clinically stable.  Anemia:multifactorial in setting of renal disease. No overt GI bleeding. Colonoscopy/EGD on file from 2017. Further evaluation as outpatient.  Irregular liver contour on recent CT: in setting of fatty liver. Further evaluation as outpatient. Spleen normal, no thrombocytopenia.  Right Adnexa structure: There is a 12 mm rounded low attenuating structure in the region of the right adnexa along the round ligament similar to prior CT (12/2017). Ultrasound may provide better evaluation.    Plan: 1. Check celiac serologies. 2. Consider pelvic ultrasound but leave to attending provider.  3. Continue supportive measrues.  4. Outpatient follow up with [facility] for multiple GI issues.   Laureen Ochs. Bernarda Caffey [facility] [phone] 5/15/20192:13 PM     LOS: 8 days

## 2018-03-12 NOTE — Progress Notes (Signed)
PROGRESS NOTE    Heather [name]  GYJ:[id number] DOB: [birth year] DOA: 03/03/2018 PCP: [name], MD   Brief Narrative:   Heather [name]  is a 67 y.o. female, with history of chronic kidney disease stage III, diabetes mellitus, hiatal hernia came to hospital with complaints of vomiting and diarrhea since Thursday.    She has been admitted with acute pancreatitis as well as acute kidney injury suspected secondary to dehydration from vomiting and diarrhea.  Nephrology has been consulted for evaluation and management of AKI which has now resolved.  She continues to have ongoing diarrhea and crampy abdominal pain with little to no improvement.  She has also developed acute gout to her right elbow which has been poorly responsive to IV steroid treatment.  She is now experiencing what appears to be steroid-induced psychosis as well.  She has also had complications of hyperglycemia with steroid use.   Assessment & Plan:   Active Problems:   AKI (acute kidney injury) ([place])   Protein-calorie malnutrition, severe   Elevated lipase   Gastroenteritis   Salmonella   1. Acute pancreatitis-resolved.  She has had multiple prior episodes of this and does follow with gastroenterology in the outpatient setting.  She is now tolerating diet with no further vomiting or epigastric pain. Will monitor overnight and discontinue IVF's. Hopefully home in am.  2. Salmonella gastroenteritis-persistent and poorly responsive to treatment.  Continue supportive measures with Imodium and Bentyl as discussed with GI.  Rocephin has been given for 5 days.  No much diarrhea reported by nurses. Patient to continue outpatient follow up with GI.  3. R arm suspected acute gout flare-improved.  Continue allopurinol, if needed will use low dose NSAID's. Right now not complaining of significant pain.  4. Steroid-induced psychosis.  No further steroids ordered.  5. AKI secondary to dehydration-improving.  Nephrology has signed  off at this poin. Cr continue trending down. Will continue to ask for proper hydration and will minimize nephrotoxic agents.  6. Diabetes mellitus type 2 with mild to moderate hyperglycemia-on chronic insulin. Continue sliding scale insulin and continue adjusted dose of levemir. As patient not longer on steroids will not need TID meal coverage.  Had 2 episodes of hypoglycemia today.  7. Vaginal yeast infection.  Patient has completed treatment with diflucan. Currently on oxybutynin for bladder spasms. No dysuria.  8. GERD.  Continue PPI.   DVT prophylaxis:Heparin Code Status: DNR Family Communication: None at bedside Disposition Plan: Continue supportive care, outpatient vaginal ultrasound for abnormal Adnexa finding on CT; adjust hypoglycemic regimen and follow clinical response. Hopefully discharge in am.    Consultants:   Nephrology  Gastroenterology  Procedures:   None  Antimicrobials:   Rocephin d/c 5/14  Diflucan   Subjective: Patient reports improvement in her overall loose stools and is no longer having nausea or vomiting.  She is eating and drinking properly.  Patient with 2 episodes of hypoglycemia.  Mentation still slightly off.  Objective: Vitals:   03/11/18 2027 03/11/18 2030 03/12/18 0557 03/12/18 1423  BP: (!) 160/72 (!) 145/68 (!) 149/66 (!) 157/77  Pulse: 68 64 (!) 59 85  Resp: 20 20 18 15   Temp: 98.5 F (36.9 C) 97.9 F (36.6 C) 98 F (36.7 C) 98.2 F (36.8 C)  TempSrc: Oral Oral  Oral  SpO2: 98% 98% 99% 97%  Weight:      Height:        Intake/Output Summary (Last 24 hours) at 03/12/2018 1825 Last data filed at 03/12/2018  1500 Gross per 24 hour  Intake 2166 ml  Output 1100 ml  Net 1066 ml   Filed Weights   03/03/18 2342 03/04/18 0800  Weight: 69.9 kg (154 lb) 64.8 kg (142 lb 13.7 oz)    Examination: General exam: Oriented x2, slightly anxious and in no acute distress.  Patient denies chest pain and shortness of breath currently.  Expressed  to feel weak and still having loose stools (even much improved). No N/N or abd pain.  Respiratory system: Good air movement bilaterally, no using accessory muscles.  Good oxygen saturation on room air.   Cardiovascular system: S1 and S2, no rubs, no gallops, no JVD.Marland Kitchen Gastrointestinal system: Obese, nontender, nondistended, positive bowel sounds, no guarding.  Central nervous system: No focal neurologic deficit appreciated.  Cranial nerves grossly intact, moving 4 limbs spontaneously and able to follow simple commands. Extremities: No edema, no cyanosis, no clubbing. Skin: No rashes, no petechiae, no open ulcers.  Data Reviewed: I have personally reviewed following labs and imaging studies  CBC: Recent Labs  Lab 03/08/18 0645 03/09/18 0624 03/10/18 0604 03/11/18 0427 03/12/18 0456  WBC 9.6 7.7 18.5* 16.5* 14.4*  HGB 9.2* 7.8* 9.0* 8.5* 8.0*  HCT 27.1* 23.7* 27.8* 26.0* 25.4*  MCV 89.4 90.8 91.4 91.2 93.4  PLT 178 150 185 207 564   Basic Metabolic Panel: Recent Labs  Lab 03/07/18 0536 03/08/18 0645 03/09/18 0624 03/10/18 0604 03/11/18 0427 03/12/18 0456  NA 136  135 138 134* 131* 135 135  K 4.2  4.2 5.0 5.2* 6.2* 5.9* 5.6*  CL 108  107 112* 111 106 108 113*  CO2 20*  20* 18* 16* 15* 20* 18*  GLUCOSE 207*  205* 195* 261* 397* 217* 131*  BUN 35*  35* 27* 27* 38* 42* 47*  CREATININE 1.61*  1.55* 1.45* 1.61* 1.90* 1.54* 1.52*  CALCIUM 8.8*  8.8* 9.0 8.7* 9.4 9.2 9.3  PHOS 2.0* 1.8* 2.4*  --   --   --    GFR: Estimated Creatinine Clearance: 33.4 mL/min (A) (by C-G formula based on SCr of 1.52 mg/dL (H)).   Liver Function Tests: Recent Labs  Lab 03/06/18 0552 03/07/18 0536 03/08/18 0645 03/09/18 0624  AST 18  --   --   --   ALT 16  --   --   --   ALKPHOS 69  --   --   --   BILITOT 0.4  --   --   --   PROT 6.0*  --   --   --   ALBUMIN 3.0* 3.1* 3.0* 2.8*   Recent Labs  Lab 03/11/18 0427  LIPASE 80*   CBG: Recent Labs  Lab 03/12/18 0413 03/12/18 0746  03/12/18 1106 03/12/18 1706 03/12/18 1759  GLUCAP 126* 98 88 48* 48*    Recent Results (from the past 240 hour(s))  Gastrointestinal Panel by PCR , Stool     Status: Abnormal   Collection Time: 03/04/18  4:11 PM  Result Value Ref Range Status   Campylobacter species NOT DETECTED NOT DETECTED Final   Plesimonas shigelloides NOT DETECTED NOT DETECTED Final   Salmonella species DETECTED (A) NOT DETECTED Final    Comment: RESULT CALLED TO, READ BACK BY AND VERIFIED WITH: BRITTANY FOLEY 03/06/18 1518 KLW    Yersinia enterocolitica NOT DETECTED NOT DETECTED Final   Vibrio species NOT DETECTED NOT DETECTED Final   Vibrio cholerae NOT DETECTED NOT DETECTED Final   Enteroaggregative E coli (EAEC) NOT DETECTED NOT DETECTED Final  Enteropathogenic E coli (EPEC) NOT DETECTED NOT DETECTED Final   Enterotoxigenic E coli (ETEC) NOT DETECTED NOT DETECTED Final   Shiga like toxin producing E coli (STEC) NOT DETECTED NOT DETECTED Final   Shigella/Enteroinvasive E coli (EIEC) NOT DETECTED NOT DETECTED Final   Cryptosporidium NOT DETECTED NOT DETECTED Final   Cyclospora cayetanensis NOT DETECTED NOT DETECTED Final   Entamoeba histolytica NOT DETECTED NOT DETECTED Final   Giardia lamblia NOT DETECTED NOT DETECTED Final   Adenovirus F40/41 NOT DETECTED NOT DETECTED Final   Astrovirus NOT DETECTED NOT DETECTED Final   Norovirus GI/GII NOT DETECTED NOT DETECTED Final   Rotavirus A NOT DETECTED NOT DETECTED Final   Sapovirus (I, II, IV, and V) NOT DETECTED NOT DETECTED Final    Comment: Performed at [facility], [address], [place],  [address]     Radiology Studies: No results found.   Scheduled Meds: . allopurinol  50 mg Oral Daily  . aspirin EC  81 mg Oral Daily  . citalopram  20 mg Oral Daily  . colesevelam  1,250 mg Oral BID WC  . dicyclomine  10 mg Oral TID AC & HS  . feeding supplement (GLUCERNA SHAKE)  237 mL Oral TID BM  . fluconazole  100 mg Oral Daily  .  gabapentin  100 mg Oral TID  . heparin  5,000 Units Subcutaneous Q8H  . insulin aspart  0-20 Units Subcutaneous Q4H  . [START ON 03/13/2018] insulin detemir  55 Units Subcutaneous Daily  . metoprolol tartrate  100 mg Oral BID  . multivitamin with minerals  1 tablet Oral Daily  . ondansetron  4 mg Oral TID AC & HS  . oxybutynin  2.5 mg Oral BID  . pantoprazole  40 mg Oral QAC breakfast  . sodium chloride flush  10-40 mL Intracatheter Q12H   Continuous Infusions:    LOS: 8 days    Time spent: 30 minutes    Barton Dubois, MD [facility] Pager [phone]  If 7PM-7AM, please contact night-coverage [email or web address] Password [facility] 03/12/2018, 6:25 PM

## 2018-03-13 DIAGNOSIS — K580 Irritable bowel syndrome with diarrhea: Secondary | ICD-10-CM

## 2018-03-13 DIAGNOSIS — E43 Unspecified severe protein-calorie malnutrition: Secondary | ICD-10-CM

## 2018-03-13 LAB — BASIC METABOLIC PANEL
Anion gap: 10 (ref 5–15)
BUN: 48 mg/dL — ABNORMAL HIGH (ref 6–20)
CO2: 18 mmol/L — ABNORMAL LOW (ref 22–32)
CREATININE: 1.77 mg/dL — AB (ref 0.44–1.00)
Calcium: 9.2 mg/dL (ref 8.9–10.3)
Chloride: 110 mmol/L (ref 101–111)
GFR calc Af Amer: 34 mL/min — ABNORMAL LOW (ref >60)
GFR, EST NON AFRICAN AMERICAN: 29 mL/min — AB (ref >60)
Glucose, Bld: 105 mg/dL — ABNORMAL HIGH (ref 65–99)
POTASSIUM: 5.3 mmol/L — AB (ref 3.5–5.1)
SODIUM: 138 mmol/L (ref 135–145)

## 2018-03-13 LAB — GLUCOSE, CAPILLARY
GLUCOSE-CAPILLARY: 108 mg/dL — AB (ref 65–99)
GLUCOSE-CAPILLARY: 123 mg/dL — AB (ref 65–99)
GLUCOSE-CAPILLARY: 131 mg/dL — AB (ref 65–99)
GLUCOSE-CAPILLARY: 68 mg/dL (ref 65–99)
GLUCOSE-CAPILLARY: 72 mg/dL (ref 65–99)
GLUCOSE-CAPILLARY: 96 mg/dL (ref 65–99)
Glucose-Capillary: 60 mg/dL — ABNORMAL LOW (ref 65–99)

## 2018-03-13 LAB — TISSUE TRANSGLUTAMINASE, IGA: Tissue Transglutaminase Ab, IgA: <2 U/mL (ref 0–3)

## 2018-03-13 LAB — IGA: IGA: 306 mg/dL (ref 87–352)

## 2018-03-13 MED ORDER — PREDNISONE 20 MG PO TABS: 20.0000 mg | ORAL_TABLET | Freq: Every day | ORAL | 0 refills | Status: DC

## 2018-03-13 MED ORDER — PREDNISONE 20 MG PO TABS
20.0000 mg | ORAL_TABLET | Freq: Every day | ORAL | Status: DC
  Administered 2018-03-13: 20 mg via ORAL
  Filled 2018-03-13: qty 1

## 2018-03-13 MED ORDER — ALLOPURINOL 100 MG PO TABS: 50.0000 mg | ORAL_TABLET | Freq: Every day | ORAL | Status: DC

## 2018-03-13 MED ORDER — DICYCLOMINE HCL 10 MG PO CAPS: 10.0000 mg | ORAL_CAPSULE | Freq: Three times a day (TID) | ORAL | 0 refills | Status: DC

## 2018-03-13 MED ORDER — LOPERAMIDE HCL 2 MG PO CAPS: 2.0000 mg | ORAL_CAPSULE | ORAL | 0 refills | Status: DC | PRN

## 2018-03-13 MED ORDER — COLESEVELAM HCL 625 MG PO TABS: 1250.0000 mg | ORAL_TABLET | Freq: Two times a day (BID) | ORAL | 0 refills | Status: DC

## 2018-03-13 NOTE — Progress Notes (Signed)
Patient CBG was 60 at 0515, Gave 1 cup of orange juice rechecked in 1 hour and was 68 gave another cup of orange juice CBG was 72. Patient VS stable and has no new complaints. Will continue to monitor.

## 2018-03-13 NOTE — Care Management Note (Signed)
Case Management Note  Patient Details  Name: Heather [name] MRN: [id number] Date of Birth: 1951/02/12  If discussed at Long Length of Stay Meetings, dates discussed:   03/13/2018 Additional Comments:  Vin Yonke, Chauncey Reading, RN 03/13/2018, 12:05 PM

## 2018-03-13 NOTE — Care Management Important Message (Signed)
Important Message  Patient Details  Name: JOYELLE SIEDLECKI MRN: [id number] Date of Birth: 1951-04-20   Medicare Important Message Given:  Yes    Icesis Renn, Chauncey Reading, RN 03/13/2018, 7:51 AM

## 2018-03-13 NOTE — Discharge Summary (Signed)
Physician Discharge Summary  Heather [name] RKY:[id number] DOB: 1951-04-14 DOA: 03/03/2018  PCP: [name], MD  Admit date: 03/03/2018 Discharge date: 03/13/2018  Time spent: 35 minutes  Recommendations for Outpatient Follow-up:  1. Repeat basic metabolic panel to follow electrolytes and renal function 2. Reassess blood pressure and further adjust antihypertensive regimen as needed 3. Follow CBGs and A1c with further adjustment on hypoglycemic regimen as needed. 4. Please make sure that the patient has pelvic/vaginal ultrasound as an outpatient to further assess abnormal adnexa findings on CT scan.   Discharge Diagnoses:  Active Problems:   AKI (acute kidney injury) ([place])   Protein-calorie malnutrition, severe   Elevated lipase   Gastroenteritis   Salmonella Depression Type 2 diabetes with hyperglycemia GERD Abnormal adnexa findings   Discharge Condition: Stable and improved.  Patient discharged home with instruction to follow-up with PCP and gastroenterologist as an outpatient.  Diet recommendation: Heart healthy (low-fat diet) and modified carbohydrates.  Filed Weights   03/03/18 2342 03/04/18 0800  Weight: 69.9 kg (154 lb) 64.8 kg (142 lb 13.7 oz)    History of present illness:  H&P written by Dr. Darrick Meigs on 03/04/2018. 67 y.o. female, with history of chronic kidney disease stage III, diabetes mellitus, hiatal hernia came to hospital with complaints of vomiting and diarrhea since Thursday.  Patient says that she was unable to keep anything down and kept on throwing up.  Also had multiple episodes of loose bowel movements. In the ED patient was found to have acute kidney injury with creatinine of 9.0 her baseline creatinine around 2.06 .  Also patient found to have,  Elevated lipase 1045, he also complains of abdominal pain. Denies chest pain or shortness of breath. No history of stroke or seizures. No fever or chills. No dysuria urgency or frequency of  urination.  Hospital Course:   1. Acute pancreatitis-resolved.  She has had multiple prior episodes of this and does follow with gastroenterology in the outpatient setting.  She is now tolerating diet with no further vomiting or epigastric pain. Will discharge home. Further follow up with Dr. Carlean Purl ([facility], patient's primary gastroenterologist)  2. Salmonella gastroenteritis-persistent and poorly responsive to treatment. Continue supportive measures with Imodium and Bentyl as discussed with GI. Rocephin has been given for 5 days.  No much diarrhea currently. Patient to continue outpatient follow up with GI.  3. R arm suspected acute gout flare-improved.  Continue allopurinol and complete 5 dose prednisone 20 mg.   4. Steroid-induced psychosis.  resolved. Most likely related with the dose.  5. AKI secondary to dehydration-improving.  Nephrology has signed off at this point. Cr continue trending down. Will encourage to maintain good hydration and would be ok to resume home diuretics.   6. Diabetes mellitus type 2 with mild to moderate hyperglycemia-on chronic insulin. Continue sliding scale insulin and home long acting hypoglycemic regimen. Advise to follow low carb diet.   7. Vaginal yeast infection.  Patient has completed treatment with diflucan. Currently denying bladder spasms and dysuria.  8. GERD.  Continue PPI. 9. Abnormal adnexa on CT scan: will recommend outpatient pelvic/vaginal [place] to further investigate. Patient denies any pain in that area.    Procedures:  See below for x-ray reports   Consultations:  GI  Nephrology service   Discharge Exam: Vitals:   03/13/18 0515 03/13/18 1428  BP: (!) 120/55 117/65  Pulse: 62 61  Resp:  18  Temp: 98.4 F (36.9 C) 97.7 F (36.5 C)  SpO2: 100% 100%  General exam: Oriented x2, in no acute distress.  Patient denies chest pain and shortness of breath currently.  Expressed to feel weak and still having some loose stools (but much  improved; in fact none recorded by nursing staff). No N/V or abd pain.  Respiratory system: Good air movement bilaterally, no using accessory muscles.  Good oxygen saturation on room air.   Cardiovascular system: S1 and S2, no rubs, no gallops, no JVD.Marland Kitchen Gastrointestinal system: Obese, nontender, nondistended, positive bowel sounds, no guarding.  Central nervous system: No focal neurologic deficit appreciated.  Cranial nerves grossly intact, moving 4 limbs spontaneously and able to follow simple commands. Extremities: No edema, no cyanosis, no clubbing. Skin: No rashes, no petechiae, no open ulcers.  Discharge Instructions   Discharge Instructions    Discharge instructions   Complete by:  As directed    Medications as prescribed Arrange follow-up with PCP in 10 days Keep yourself well-hydrated Follow-up with gastroenterologist (Dr. Carlean Purl) as recommended Follow heart healthy and modified carb diet Increase fiber intake.     Allergies as of 03/13/2018      Reactions   Duloxetine Hcl Swelling   Fluocinolone Swelling   Acetaminophen Itching, Swelling   Pt states "some swelling"   Amlodipine Besylate Swelling   Swelling of feet, fluid retention   Ciprofloxacin Nausea And Vomiting, Swelling   Swelling of face, jaw, and lips   Hydrocodone-acetaminophen Itching   Metformin Other (See Comments)   REACTION: GI UPSET   Nsaids Other (See Comments)   "HAS BLEEDING ULCERS"   Quinolones Swelling   Sulfamethoxazole Swelling   Swelling of feet, legs   Sulfonamide Derivatives Swelling   Valsartan Swelling   Cozaar [losartan] Swelling, Rash   Lisinopril Swelling, Rash   Oral swelling, and red streaks on arms/stomach      Medication List    TAKE these medications   allopurinol 100 MG tablet Commonly known as:  ZYLOPRIM Take 0.5 tablets (50 mg total) by mouth daily. What changed:  how much to take   aspirin EC 81 MG tablet Take 81 mg by mouth daily.   atorvastatin 80 MG  tablet Commonly known as:  LIPITOR Take 80 mg by mouth daily.   BD PEN NEEDLE NANO U/F 32G X 4 MM Misc Generic drug:  Insulin Pen Needle   citalopram 40 MG tablet Commonly known as:  CELEXA Take 0.5 tablets (20 mg total) by mouth daily.   colesevelam 625 MG tablet Commonly known as:  WELCHOL Take 2 tablets (1,250 mg total) by mouth 2 (two) times daily with a meal.   cyanocobalamin 1000 MCG/ML injection Commonly known as:  (VITAMIN B-12) Inject 1,000 mcg into the muscle every 30 (thirty) days.   diazepam 5 MG tablet Commonly known as:  VALIUM Take 1 tablet (5 mg total) by mouth 2 (two) times daily as needed for anxiety.   dicyclomine 10 MG capsule Commonly known as:  BENTYL Take 1 capsule (10 mg total) by mouth 4 (four) times daily -  before meals and at bedtime.   esomeprazole 40 MG capsule Commonly known as:  NEXIUM Take 40 mg by mouth daily before breakfast.   furosemide 40 MG tablet Commonly known as:  LASIX Take 1 tablet (40 mg total) by mouth daily.   gabapentin 100 MG capsule Commonly known as:  NEURONTIN Take 1 capsule (100 mg total) by mouth 3 (three) times daily.   glucose blood test strip Commonly known as:  ONE TOUCH TEST STRIPS Use as instructed  4 x daily. E11.65   HUMALOG KWIKPEN [place] Inject into the skin 3 (three) times daily with meals. Per home sliding scale   Insulin Detemir 100 UNIT/ML Pen Commonly known as:  LEVEMIR FLEXTOUCH Inject 80 Units into the skin at bedtime.   liraglutide 18 MG/3ML Sopn Commonly known as:  VICTOZA USE 1.8 MG EVERY DAY AS DIRECTED   loperamide 2 MG capsule Commonly known as:  IMODIUM Take 1 capsule (2 mg total) by mouth as needed for diarrhea or loose stools (no more than 8 mg in 24 hours.).   metoprolol tartrate 100 MG tablet Commonly known as:  LOPRESSOR Take 100 mg by mouth 2 (two) times daily.   ondansetron 4 MG tablet Commonly known as:  ZOFRAN 1-2 every 6 hrs as needed for nausea, cramps, diarrhea    ONETOUCH VERIO w/Device Kit 1 each by Does not apply route as needed. Test 4 times daily   potassium chloride SA 20 MEQ tablet Commonly known as:  K-DUR,KLOR-CON Take 20 mEq by mouth daily.   predniSONE 20 MG tablet Commonly known as:  DELTASONE Take 1 tablet (20 mg total) by mouth daily with breakfast. Start taking on:  03/14/2018   Vitamin D 2000 units Caps Take 2,000 Units by mouth daily.      Allergies  Allergen Reactions  . Duloxetine Hcl Swelling  . Fluocinolone Swelling  . Acetaminophen Itching and Swelling    Pt states "some swelling"  . Amlodipine Besylate Swelling    Swelling of feet, fluid retention  . Ciprofloxacin Nausea And Vomiting and Swelling    Swelling of face, jaw, and lips  . Hydrocodone-Acetaminophen Itching  . Metformin Other (See Comments)    REACTION: GI UPSET  . Nsaids Other (See Comments)    "HAS BLEEDING ULCERS"  . Quinolones Swelling  . Sulfamethoxazole Swelling    Swelling of feet, legs  . Sulfonamide Derivatives Swelling  . Valsartan Swelling  . Cozaar [Losartan] Swelling and Rash  . Lisinopril Swelling and Rash    Oral swelling, and red streaks on arms/stomach   Follow-up Information    [name], MD. Schedule an appointment as soon as possible for a visit in 10 day(s).   Specialty:  Internal Medicine Contact information: [address]  [place] [address] [phone]        Gatha Mayer, MD. Schedule an appointment as soon as possible for a visit today.   Specialty:  Gastroenterology Why:  call office to arrange appointment (3-4 weeks) Contact information: [address] [place] [address] [phone]           The results of significant diagnostics from this hospitalization (including imaging, microbiology, ancillary and laboratory) are listed below for reference.    Significant Diagnostic Studies: Ct Abdomen Pelvis Wo Contrast  Result Date: 03/04/2018 CLINICAL DATA:  67 year old female with nausea  vomiting abdominal pain and chills. EXAM: CT ABDOMEN AND PELVIS WITHOUT CONTRAST TECHNIQUE: Multidetector CT imaging of the abdomen and pelvis was performed following the standard protocol without IV contrast. COMPARISON:  Abdominal CT dated 01/08/2018 FINDINGS: Evaluation of this exam is limited in the absence of intravenous contrast. Lower chest: The visualized lung bases are clear. There is coronary vascular calcification. No intra-abdominal free air or free fluid. Hepatobiliary: There is fatty infiltration of the liver. Mild irregularity of the liver contour may represent early changes of cirrhosis. Clinical correlation is recommended. No intrahepatic biliary ductal dilatation. Cholecystectomy. Pancreas: Unremarkable. No pancreatic ductal dilatation or surrounding inflammatory changes. Spleen:  Normal in size without focal abnormality. Adrenals/Urinary Tract: Adrenal glands are unremarkable. Kidneys are normal, without renal calculi, focal lesion, or hydronephrosis. Bladder is unremarkable. Stomach/Bowel: There is thickened appearance of the stomach which may be related to underdistention or represent gastritis. Clinical correlation is recommended. Normal caliber fluid-filled loops of small bowel may be physiologic or represent enteritis. Loose stool noted throughout the colon compatible with diarrheal state. There is no bowel obstruction. The appendix is not visualized with certainty. No inflammatory changes identified in the right lower quadrant. Vascular/Lymphatic: Advanced aortoiliac atherosclerotic disease. The abdominal aorta and IVC are otherwise grossly unremarkable on this noncontrast CT. No portal venous gas. There is no adenopathy. Reproductive: Hysterectomy. There is a 12 mm rounded low attenuating structure in the region of the right adnexa along the round ligament similar to prior CT. Ultrasound may provide better evaluation. Other: None Musculoskeletal: Degenerative changes of the spine. No acute  osseous pathology. IMPRESSION: 1. Diarrheal state with possible gastroenteritis. Clinical correlation is recommended. No bowel obstruction. 2. Mild fatty liver. 3.  Aortic Atherosclerosis (ICD10-I70.0). Electronically Signed   By: Anner Crete M.D.   On: 03/04/2018 03:18   US Renal  Result Date: 03/04/2018 CLINICAL DATA:  Acute renal insufficiency, diabetes, hyperlipidemia EXAM: RENAL / URINARY TRACT ULTRASOUND COMPLETE COMPARISON:  CT abdomen pelvis of 03/04/2017 FINDINGS: Right Kidney: Length: 10.2 cm. No hydronephrosis is seen. The echogenicity of the renal parenchyma is increased consistent with chronic renal medical disease. Left Kidney: Length: 10.2 cm. No hydronephrosis is seen. There is a small exophytic cyst from upper pole posteriorly measuring 1 cm in diameter. The echogenicity of the renal parenchyma is increased consistent with chronic renal medical disease. Bladder: The urinary bladder is not well distended, and ureteral jets were not visualized. IMPRESSION: 1. No hydronephrosis. 2. Echogenic renal parenchyma bilaterally consistent with chronic renal medical disease. 3. Incidental note of echogenic liver parenchyma consistent with hepatic steatosis. Electronically Signed   By: Ivar Drape M.D.   On: 03/04/2018 11:39   US Venous Img Upper Uni Right  Result Date: 03/09/2018 CLINICAL DATA:  67 year old female with a history of arm swelling EXAM: RIGHT UPPER EXTREMITY VENOUS DOPPLER ULTRASOUND TECHNIQUE: Gray-scale sonography with graded compression, as well as color Doppler and duplex ultrasound were performed to evaluate the upper extremity deep venous system from the level of the subclavian vein and including the jugular, axillary, basilic, radial, ulnar and upper cephalic vein. Spectral Doppler was utilized to evaluate flow at rest and with distal augmentation maneuvers. COMPARISON:  None. FINDINGS: Contralateral Subclavian Vein: Respiratory phasicity is normal and symmetric with the  symptomatic side. No evidence of thrombus. Normal compressibility. Internal Jugular Vein: No evidence of thrombus. Normal compressibility, respiratory phasicity and response to augmentation. Subclavian Vein: No evidence of thrombus. Normal compressibility, respiratory phasicity and response to augmentation. Axillary Vein: No evidence of thrombus. Normal compressibility, respiratory phasicity and response to augmentation. Cephalic Vein: No evidence of thrombus. Normal compressibility, respiratory phasicity and response to augmentation. Basilic Vein: No evidence of thrombus. Normal compressibility, respiratory phasicity and response to augmentation. Brachial Veins: No evidence of thrombus. Normal compressibility, respiratory phasicity and response to augmentation. Radial Veins: No evidence of thrombus. Normal compressibility, respiratory phasicity and response to augmentation. Ulnar Veins: No evidence of thrombus. Normal compressibility, respiratory phasicity and response to augmentation. Other Findings:  None visualized. IMPRESSION: Sonographic survey of the right upper extremity negative for DVT. Electronically Signed   By: Corrie Mckusick D.O.   On: 03/09/2018 12:22  Microbiology: Recent Results (from the past 240 hour(s))  Gastrointestinal Panel by PCR , Stool     Status: Abnormal   Collection Time: 03/04/18  4:11 PM  Result Value Ref Range Status   Campylobacter species NOT DETECTED NOT DETECTED Final   Plesimonas shigelloides NOT DETECTED NOT DETECTED Final   Salmonella species DETECTED (A) NOT DETECTED Final    Comment: RESULT CALLED TO, READ BACK BY AND VERIFIED WITH: BRITTANY FOLEY 03/06/18 1518 KLW    Yersinia enterocolitica NOT DETECTED NOT DETECTED Final   Vibrio species NOT DETECTED NOT DETECTED Final   Vibrio cholerae NOT DETECTED NOT DETECTED Final   Enteroaggregative E coli (EAEC) NOT DETECTED NOT DETECTED Final   Enteropathogenic E coli (EPEC) NOT DETECTED NOT DETECTED Final    Enterotoxigenic E coli (ETEC) NOT DETECTED NOT DETECTED Final   Shiga like toxin producing E coli (STEC) NOT DETECTED NOT DETECTED Final   Shigella/Enteroinvasive E coli (EIEC) NOT DETECTED NOT DETECTED Final   Cryptosporidium NOT DETECTED NOT DETECTED Final   Cyclospora cayetanensis NOT DETECTED NOT DETECTED Final   Entamoeba histolytica NOT DETECTED NOT DETECTED Final   Giardia lamblia NOT DETECTED NOT DETECTED Final   Adenovirus F40/41 NOT DETECTED NOT DETECTED Final   Astrovirus NOT DETECTED NOT DETECTED Final   Norovirus GI/GII NOT DETECTED NOT DETECTED Final   Rotavirus A NOT DETECTED NOT DETECTED Final   Sapovirus (I, II, IV, and V) NOT DETECTED NOT DETECTED Final    Comment: Performed at [facility], [address] [place], [place] [address]     Labs: Basic Metabolic Panel: Recent Labs  Lab 03/07/18 0536 03/08/18 0645 03/09/18 7972 03/10/18 0604 03/11/18 0427 03/12/18 0456 03/13/18 0750  NA 136  135 138 134* 131* 135 135 138  K 4.2  4.2 5.0 5.2* 6.2* 5.9* 5.6* 5.3*  CL 108  107 112* 111 106 108 113* 110  CO2 20*  20* 18* 16* 15* 20* 18* 18*  GLUCOSE 207*  205* 195* 261* 397* 217* 131* 105*  BUN 35*  35* 27* 27* 38* 42* 47* 48*  CREATININE 1.61*  1.55* 1.45* 1.61* 1.90* 1.54* 1.52* 1.77*  CALCIUM 8.8*  8.8* 9.0 8.7* 9.4 9.2 9.3 9.2  PHOS 2.0* 1.8* 2.4*  --   --   --   --    Liver Function Tests: Recent Labs  Lab 03/07/18 0536 03/08/18 0645 03/09/18 0624  ALBUMIN 3.1* 3.0* 2.8*   Recent Labs  Lab 03/11/18 0427  LIPASE 80*   CBC: Recent Labs  Lab 03/08/18 0645 03/09/18 0624 03/10/18 0604 03/11/18 0427 03/12/18 0456  WBC 9.6 7.7 18.5* 16.5* 14.4*  HGB 9.2* 7.8* 9.0* 8.5* 8.0*  HCT 27.1* 23.7* 27.8* 26.0* 25.4*  MCV 89.4 90.8 91.4 91.2 93.4  PLT 178 150 185 207 226    CBG: Recent Labs  Lab 03/13/18 0516 03/13/18 0617 03/13/18 0653 03/13/18 0738 03/13/18 1209  GLUCAP 60* 68 72 108* 96    Signed:  Barton Dubois MD.   [facility] 03/13/2018, 3:37 PM

## 2018-03-19 ENCOUNTER — Telehealth: Payer: Self-pay | Admitting: Internal Medicine

## 2018-03-19 NOTE — Telephone Encounter (Signed)
Left message for patient to call back  

## 2018-03-25 NOTE — Telephone Encounter (Signed)
No return call from the patient.  I will await a return call from her.

## 2018-03-28 ENCOUNTER — Ambulatory Visit (INDEPENDENT_AMBULATORY_CARE_PROVIDER_SITE_OTHER): Payer: Medicare Other | Admitting: Physician Assistant

## 2018-03-28 ENCOUNTER — Encounter: Payer: Self-pay | Admitting: Physician Assistant

## 2018-03-28 ENCOUNTER — Other Ambulatory Visit (INDEPENDENT_AMBULATORY_CARE_PROVIDER_SITE_OTHER): Payer: Medicare Other

## 2018-03-28 VITALS — BP 136/68 | HR 76 | Ht 63.0 in | Wt 149.4 lb

## 2018-03-28 DIAGNOSIS — R112 Nausea with vomiting, unspecified: Secondary | ICD-10-CM

## 2018-03-28 DIAGNOSIS — R300 Dysuria: Secondary | ICD-10-CM

## 2018-03-28 DIAGNOSIS — Z8719 Personal history of other diseases of the digestive system: Secondary | ICD-10-CM | POA: Diagnosis not present

## 2018-03-28 DIAGNOSIS — R1084 Generalized abdominal pain: Secondary | ICD-10-CM | POA: Diagnosis not present

## 2018-03-28 DIAGNOSIS — R197 Diarrhea, unspecified: Secondary | ICD-10-CM

## 2018-03-28 LAB — CBC WITH DIFFERENTIAL/PLATELET
BASOS ABS: 0.1 10*3/uL (ref 0.0–0.1)
Basophils Relative: 1.3 % (ref 0.0–3.0)
EOS ABS: 0.2 10*3/uL (ref 0.0–0.7)
Eosinophils Relative: 1.8 % (ref 0.0–5.0)
HEMATOCRIT: 34.1 % — AB (ref 36.0–46.0)
Hemoglobin: 11.1 g/dL — ABNORMAL LOW (ref 12.0–15.0)
LYMPHS PCT: 11.3 % — AB (ref 12.0–46.0)
Lymphs Abs: 1.3 10*3/uL (ref 0.7–4.0)
MCHC: 32.5 g/dL (ref 30.0–36.0)
MCV: 91.5 fl (ref 78.0–100.0)
MONOS PCT: 4.7 % (ref 3.0–12.0)
Monocytes Absolute: 0.6 10*3/uL (ref 0.1–1.0)
Neutro Abs: 9.6 10*3/uL — ABNORMAL HIGH (ref 1.4–7.7)
Neutrophils Relative %: 80.9 % — ABNORMAL HIGH (ref 43.0–77.0)
Platelets: 230 10*3/uL (ref 150.0–400.0)
RBC: 3.72 Mil/uL — ABNORMAL LOW (ref 3.87–5.11)
RDW: 15.8 % — ABNORMAL HIGH (ref 11.5–15.5)
WBC: 11.8 10*3/uL — ABNORMAL HIGH (ref 4.0–10.5)

## 2018-03-28 LAB — URINALYSIS, ROUTINE W REFLEX MICROSCOPIC
BILIRUBIN URINE: NEGATIVE
KETONES UR: NEGATIVE
NITRITE: POSITIVE — AB
Specific Gravity, Urine: <=1.005 — AB (ref 1.000–1.030)
Total Protein, Urine: NEGATIVE
URINE GLUCOSE: NEGATIVE
UROBILINOGEN UA: 1 (ref 0.0–1.0)
pH: 5 (ref 5.0–8.0)

## 2018-03-28 LAB — COMPREHENSIVE METABOLIC PANEL
ALT: 29 U/L (ref 0–35)
AST: 17 U/L (ref 0–37)
Albumin: 4 g/dL (ref 3.5–5.2)
Alkaline Phosphatase: 92 U/L (ref 39–117)
BILIRUBIN TOTAL: 0.5 mg/dL (ref 0.2–1.2)
BUN: 32 mg/dL — ABNORMAL HIGH (ref 6–23)
CALCIUM: 9.5 mg/dL (ref 8.4–10.5)
CHLORIDE: 103 meq/L (ref 96–112)
CO2: 24 mEq/L (ref 19–32)
CREATININE: 1.36 mg/dL — AB (ref 0.40–1.20)
GFR: 41.39 mL/min — AB (ref >60.00)
Glucose, Bld: 182 mg/dL — ABNORMAL HIGH (ref 70–99)
Potassium: 3.5 mEq/L (ref 3.5–5.1)
Sodium: 137 mEq/L (ref 135–145)
Total Protein: 7.2 g/dL (ref 6.0–8.3)

## 2018-03-28 MED ORDER — DIPHENOXYLATE-ATROPINE 2.5-0.025 MG PO TABS: ORAL_TABLET | ORAL | 1 refills | Status: DC

## 2018-03-28 MED ORDER — ONDANSETRON 8 MG PO TBDP: ORAL_TABLET | ORAL | 2 refills | Status: DC

## 2018-03-28 MED ORDER — CIPROFLOXACIN HCL 250 MG PO TABS: ORAL_TABLET | ORAL | 0 refills | Status: DC

## 2018-03-28 MED ORDER — PANCRELIPASE (LIP-PROT-AMYL) 36000-114000 UNITS PO CPEP: ORAL_CAPSULE | ORAL | 0 refills | Status: DC

## 2018-03-28 NOTE — Patient Instructions (Addendum)
Your provider has requested that you go to the basement level for lab work before leaving today. Press "B" on the elevator. The lab is located at the first door on the left as you exit the elevator.  We sent a prescription for Cipro to your pharmacy. Aslo sent, Lomotil, Zofran and Creon. We have provided you with Creon samples. Take 2 with meals and 1 with snacks.

## 2018-03-28 NOTE — Progress Notes (Addendum)
Chief Complaint: Diarrhea, nausea, vomiting, dysuria  HPI:    Heather [name] is a 67 year old female with a past medical history as listed below, who follows with Dr. Carlean Purl for her irritable bowel syndrome with diarrhea and presents to clinic today to follow up after recent hospital admission with continued complaints of diarrhea, nausea, vomiting and abdominal pain.    06/26/2017 office visit for IBS and chronic nausea and vomiting with a question of gastroparesis.  At that time Reglan was working well for her nausea and vomiting, she took this 1-3 times per day.  Described diarrhea was better most days, but had 2 to 3 days of lower abdominal cramping.  Lotronex at 0.5 and 1 mg twice a day did not really make a difference.  Colonoscopy with random biopsies in the terminal ileum examined 2017 were normal.  Ondansetron 4-8 mg every 6 hours was tried.  Also discussed that she could try treatment with rifaximin or other antibiotic if continuous symptoms.  Continued on metoclopramide.    02/20/2018 Dr. Carlean Purl reviewed chart patient had diabetes which is out of control and was in the hospital in [facility] 12/2017.  She was describing diarrhea after meals and that IBgard was no longer helping and the cholestyramine is making her sick.  At that time stools were ordered for C. difficile and fecal elastase.    03/03/18-03/13/2018 another admission for AKI, protein calorie malnutrition, elevated lipase of 1045, gastroenteritis and Salmonella.  Apparently had acute pancreatitis which resolved.  Also Salmonella gastroenteritis which was persistent and poorly responsive to treatment.  She was continued on Imodium and Bentyl.  Rocephin was given for 5 days.    03/10/2018 consulted by [facility] gastroenterology during hospitalization.  CT abdomen pelvis with contrast on that admission showed thickened appearance of the stomach that could be related to under distention versus gastritis and loose stool throughout the colon.  GI  path panel was Salmonella on 03/04/2018.  Leukocytosis with a white count 18.5.  They added scheduled Zofran and check C. difficile and PCR.    Today, explains that she continues with all the symptoms she had during her admission.  She has continued with watery stools at least 10 times a day which she has had since the beginning of May and her admission.  There has been no change even after being placed on antibiotics for suspected Salmonella.  Patient describes she had diarrhea before but everything has been worse over the past month.  Denies seeing any blood in her stool.  History of diarrhea, tried on Zofran, loperamide and others in the past with no relief.  Currently on Dicyclomine 10 mg 4 times daily which is not helping.  No longer using WelChol, no longer on Reglan.  Accompanying weight loss of around 15 pounds.    Continues with nausea and vomiting.  She vomited at least 5 times yesterday, is constantly nauseous and everything is worse if she tries to put anything in her mouth.  Describes that she vomits even water.  Patient is not taking any medication as she ran out of her Zofran. Also generalized abdominal pain.    Also complains of pain when she urinates today and a pelvic pressure, has been using over-the-counter Azo for this which does help some but does not completely relieve her symptoms.   Denies fever, chills or symptoms that awaken her from sleep.  Past Medical History:  Diagnosis Date  . Acute renal failure superimposed on stage 3 chronic kidney disease ([place]) 01/21/2016  .  B12 deficiency   . Cellulitis and abscess    Abdomen and buttocks  . CKD (chronic kidney disease) stage 3, GFR 30-59 ml/min (HCC)   . Closed fracture of distal end of femur, unspecified fracture morphology, initial encounter ([place])   . Depression   . Diabetic neuropathy ([place])   . DJD (degenerative joint disease)    Right forminal stenosis C4-5  . Essential hypertension   . Femur fracture, right ([place]) 03/30/2017    . Folliculitis   . Gout   . Headache(784.0)   . Hiatal hernia   . History of bronchitis   . History of cardiac catheterization    No significant CAD 2012  . Insomnia   . Irritable bowel syndrome   . Mixed hyperlipidemia   . Osteoarthritis   . Palpitations   . Reflux esophagitis   . Tinnitus    Right  . Type 2 diabetes mellitus [facility])     Past Surgical History:  Procedure Laterality Date  . c-spine surgery     03/2007  . CARDIAC CATHETERIZATION    . CHOLECYSTECTOMY    . COLONOSCOPY  04/21/2011; 05/29/11   6/12: [place] - ?AVM at IC valve, inflammatory changes at [facility] valve [facility]); 7/12 - [place]; IC valve erosions, look chronic and probably Crohn's per path  . colonoscopy  2017   [place]: random biopsies normal. TI normal  . ESOPHAGOGASTRODUODENOSCOPY  05/29/11   normal  . ESOPHAGOGASTRODUODENOSCOPY N/A 01/21/2016   Dr. Michail Sermon: normal  . IR FLUORO GUIDE CV LINE RIGHT  04/01/2017  . IR US GUIDE VASC ACCESS RIGHT  04/01/2017  . JOINT REPLACEMENT    . ORIF FEMUR FRACTURE Right 03/31/2017   Procedure: OPEN REDUCTION INTERNAL FIXATION (ORIF) DISTAL FEMUR FRACTURE;  Surgeon: Paralee Cancel, MD;  Location: [place];  Service: Orthopedics;  Laterality: Right;  . Right knee arthroscopic surgery    . TOTAL ABDOMINAL HYSTERECTOMY      Current Outpatient Medications  Medication Sig Dispense Refill  . allopurinol (ZYLOPRIM) 100 MG tablet Take 0.5 tablets (50 mg total) by mouth daily.    Marland Kitchen aspirin EC 81 MG tablet Take 81 mg by mouth daily.    Marland Kitchen atorvastatin (LIPITOR) 80 MG tablet Take 80 mg by mouth daily.    . BD PEN NEEDLE NANO U/F 32G X 4 MM MISC   2  . Blood Glucose Monitoring Suppl (ONETOUCH VERIO) w/Device KIT 1 each by Does not apply route as needed. Test 4 times daily 1 kit 0  . Cholecalciferol (VITAMIN D) 2000 units CAPS Take 2,000 Units by mouth daily.     . citalopram (CELEXA) 40 MG tablet Take 0.5 tablets (20 mg total) by mouth daily.    . colesevelam (WELCHOL) 625  MG tablet Take 2 tablets (1,250 mg total) by mouth 2 (two) times daily with a meal. 40 tablet 0  . cyanocobalamin (,VITAMIN B-12,) 1000 MCG/ML injection Inject 1,000 mcg into the muscle every 30 (thirty) days.      . diazepam (VALIUM) 5 MG tablet Take 1 tablet (5 mg total) by mouth 2 (two) times daily as needed for anxiety. 6 tablet 0  . dicyclomine (BENTYL) 10 MG capsule Take 1 capsule (10 mg total) by mouth 4 (four) times daily -  before meals and at bedtime. 90 capsule 0  . esomeprazole (NEXIUM) 40 MG capsule Take 40 mg by mouth daily before breakfast.      . furosemide (LASIX) 40 MG tablet Take 1 tablet (40 mg total) by  mouth daily. 30 tablet 0  . gabapentin (NEURONTIN) 100 MG capsule Take 1 capsule (100 mg total) by mouth 3 (three) times daily. 90 capsule 0  . glucose blood (ONE TOUCH TEST STRIPS) test strip Use as instructed 4 x daily. E11.65 150 each 3  . Insulin Detemir (LEVEMIR FLEXTOUCH) 100 UNIT/ML Pen Inject 80 Units into the skin at bedtime.    . Insulin Lispro (HUMALOG KWIKPEN [place]) Inject into the skin 3 (three) times daily with meals. Per home sliding scale    . liraglutide (VICTOZA) 18 MG/3ML SOPN USE 1.8 MG EVERY DAY AS DIRECTED 27 mL 0  . loperamide (IMODIUM) 2 MG capsule Take 1 capsule (2 mg total) by mouth as needed for diarrhea or loose stools (no more than 8 mg in 24 hours.). 20 capsule 0  . metoprolol (LOPRESSOR) 100 MG tablet Take 100 mg by mouth 2 (two) times daily.     . ondansetron (ZOFRAN) 4 MG tablet 1-2 every 6 hrs as needed for nausea, cramps, diarrhea 30 tablet 1  . potassium chloride SA (K-DUR,KLOR-CON) 20 MEQ tablet Take 20 mEq by mouth daily.    . predniSONE (DELTASONE) 20 MG tablet Take 1 tablet (20 mg total) by mouth daily with breakfast. 5 tablet 0   No current facility-administered medications for this visit.     Allergies as of 03/28/2018 - Review Complete 03/28/2018  Allergen Reaction Noted  . Duloxetine hcl Swelling 01/23/2016  . Fluocinolone Swelling  01/23/2016  . Acetaminophen Itching and Swelling 12/25/2016  . Amlodipine besylate Swelling   . Ciprofloxacin Nausea And Vomiting and Swelling   . Hydrocodone-acetaminophen Itching 12/22/2014  . Metformin Other (See Comments)   . Nsaids Other (See Comments) 08/29/2016  . Quinolones Swelling 12/22/2014  . Sulfamethoxazole Swelling 12/22/2014  . Sulfonamide derivatives Swelling   . Valsartan Swelling 12/22/2014  . Cozaar [losartan] Swelling and Rash 08/29/2016  . Lisinopril Swelling and Rash     Family History  Problem Relation Age of Onset  . Diabetes Mother   . Colon cancer Brother        POSSIBLY. Patient states diagnosed at age 33, then later states age 49. unclear and limited historian    Social History   Socioeconomic History  . Marital status: Married    Spouse name: Not on file  . Number of children: 2  . Years of education: Not on file  . Highest education level: Not on file  Occupational History  . Occupation: Disabled  Social Needs  . Financial resource strain: Not on file  . Food insecurity:    Worry: Not on file    Inability: Not on file  . Transportation needs:    Medical: Not on file    Non-medical: Not on file  Tobacco Use  . Smoking status: Never Smoker  . Smokeless tobacco: Never Used  Substance and Sexual Activity  . Alcohol use: No  . Drug use: No  . Sexual activity: Not on file  Lifestyle  . Physical activity:    Days per week: Not on file    Minutes per session: Not on file  . Stress: Not on file  Relationships  . Social connections:    Talks on phone: Not on file    Gets together: Not on file    Attends religious service: Not on file    Active member of club or organization: Not on file    Attends meetings of clubs or organizations: Not on file    Relationship status:  Not on file  . Intimate partner violence:    Fear of current or ex partner: Not on file    Emotionally abused: Not on file    Physically abused: Not on file    Forced  sexual activity: Not on file  Other Topics Concern  . Not on file  Social History Narrative   2 glasses of tea daily     Review of Systems:    Constitutional: No weight loss, fever or chills Skin: No rash Cardiovascular: No chest pain Respiratory: No SOB  Gastrointestinal: See HPI and otherwise negative Genitourinary: +dysuria Neurological: No headache, dizziness or syncope Musculoskeletal: No new muscle or joint pain Hematologic: No bleeding  Psychiatric: No history of depression or anxiety   Physical Exam:  Vital signs: BP 136/68   Pulse 76   Ht '5\' 3"'  (1.6 m)   Wt 149 lb 6 oz (67.8 kg)   BMI 26.46 kg/m    Constitutional:   Pleasant Caucasian female appears to be in NAD, Well developed, Well nourished, alert and cooperative Head:  Normocephalic and atraumatic. Eyes:   PEERL, EOMI. No icterus. Conjunctiva pink. Ears:  Normal auditory acuity. Neck:  Supple Throat: Oral cavity and pharynx without inflammation, swelling or lesion.  Respiratory: Respirations even and unlabored. Lungs clear to auscultation bilaterally.   No wheezes, crackles, or rhonchi.  Cardiovascular: Normal S1, S2. No MRG. Regular rate and rhythm. No peripheral edema, cyanosis or pallor.  Gastrointestinal:  Soft, nondistended, Marked Generalized ttp, worse in epigastrum with guarding, Normal bowel sounds. No appreciable masses or hepatomegaly. Rectal:  Not performed.  Msk:  Symmetrical without gross deformities. Without edema, no deformity or joint abnormality.  Neurologic:  Alert and  oriented x4;  grossly normal neurologically.  Skin:   Dry and intact without significant lesions or rashes. Psychiatric: Demonstrates good judgement and reason without abnormal affect or behaviors.  RELEVANT LABS AND IMAGING: CBC    Component Value Date/Time   WBC 14.4 (H) 03/12/2018 0456   RBC 2.72 (L) 03/12/2018 0456   HGB 8.0 (L) 03/12/2018 0456   HCT 25.4 (L) 03/12/2018 0456   PLT 226 03/12/2018 0456   MCV 93.4  03/12/2018 0456   MCH 29.4 03/12/2018 0456   MCHC 31.5 03/12/2018 0456   RDW 15.2 03/12/2018 0456   LYMPHSABS 1.0 04/03/2017 0022   MONOABS 0.7 04/03/2017 0022   EOSABS 0.1 04/03/2017 0022   BASOSABS 0.0 04/03/2017 0022    CMP     Component Value Date/Time   NA 138 03/13/2018 0750   NA 143 06/17/2017 1220   K 5.3 (H) 03/13/2018 0750   CL 110 03/13/2018 0750   CO2 18 (L) 03/13/2018 0750   GLUCOSE 105 (H) 03/13/2018 0750   BUN 48 (H) 03/13/2018 0750   BUN 34 (H) 06/17/2017 1220   CREATININE 1.77 (H) 03/13/2018 0750   CALCIUM 9.2 03/13/2018 0750   PROT 6.0 (L) 03/06/2018 0552   PROT 7.0 06/17/2017 1220   ALBUMIN 2.8 (L) 03/09/2018 0624   ALBUMIN 4.4 06/17/2017 1220   AST 18 03/06/2018 0552   ALT 16 03/06/2018 0552   ALKPHOS 69 03/06/2018 0552   BILITOT 0.4 03/06/2018 0552   BILITOT 0.3 06/17/2017 1220   GFRNONAA 29 (L) 03/13/2018 0750   GFRAA 34 (L) 03/13/2018 0750   EXAM: CT ABDOMEN AND PELVIS WITHOUT CONTRAST 03/04/18  TECHNIQUE: Multidetector CT imaging of the abdomen and pelvis was performed following the standard protocol without IV contrast.  COMPARISON:  Abdominal CT dated 01/08/2018  FINDINGS: Evaluation of this exam is limited in the absence of intravenous contrast.  Lower chest: The visualized lung bases are clear. There is coronary vascular calcification.  No intra-abdominal free air or free fluid.  Hepatobiliary: There is fatty infiltration of the liver. Mild irregularity of the liver contour may represent early changes of cirrhosis. Clinical correlation is recommended. No intrahepatic biliary ductal dilatation. Cholecystectomy.  Pancreas: Unremarkable. No pancreatic ductal dilatation or surrounding inflammatory changes.  Spleen: Normal in size without focal abnormality.  Adrenals/Urinary Tract: Adrenal glands are unremarkable. Kidneys are normal, without renal calculi, focal lesion, or hydronephrosis. Bladder is  unremarkable.  Stomach/Bowel: There is thickened appearance of the stomach which may be related to underdistention or represent gastritis. Clinical correlation is recommended. Normal caliber fluid-filled loops of small bowel may be physiologic or represent enteritis. Loose stool noted throughout the colon compatible with diarrheal state. There is no bowel obstruction. The appendix is not visualized with certainty. No inflammatory changes identified in the right lower quadrant.  Vascular/Lymphatic: Advanced aortoiliac atherosclerotic disease. The abdominal aorta and IVC are otherwise grossly unremarkable on this noncontrast CT. No portal venous gas. There is no adenopathy.  Reproductive: Hysterectomy. There is a 12 mm rounded low attenuating structure in the region of the right adnexa along the round ligament similar to prior CT. Ultrasound may provide better evaluation.  Other: None  Musculoskeletal: Degenerative changes of the spine. No acute osseous pathology.  IMPRESSION: 1. Diarrheal state with possible gastroenteritis. Clinical correlation is recommended. No bowel obstruction. 2. Mild fatty liver. 3.  Aortic Atherosclerosis (ICD10-I70.0).   Electronically Signed   By: Anner Crete M.D.   On: 03/04/2018 03:18  Assessment: 1.  Diarrhea: Long involved GI history, follows with Dr. Carlean Purl for suspected IBS-D, recent treatment for Salmonella with no change in 10 watery stools per day, now with weight loss, history of pancreatitis; question IBS-C +/- pancreatic insufficiency +/- continued infectious cause 2.  Nausea and vomiting: Daily per the patient, worse with the smell of food, currently not on any antiemetics, this was controlled during hospitalization with Reglan and Zofran 3.  Dysuria: Pain when urinating as well as some pelvic pressure, some better with Azo but not completely relieved; likely UTI 4.  Abdominal pain: Generalized abdominal pain today, somewhat  worse in the epigastrium, recent episode of pancreatitis, recent CT at the beginning of May, also history of IBS 5.  History of pancreatitis: Recent admission in May with an elevated lipase, continued abdominal pain, nausea, vomiting today, currently on Victoza  Plan: 1.  Recommend the patient discuss discontinuing Victoza with her PCP.  This can have a side effect of pancreatitis and may be the cause of her recent symptoms.  Recommend she discuss discontinuing this with her PCP and finding another alternative for her diabetes. 2.  Ordered labs to include a CBC and CMP today. 3.  Also ordered stat urinalysis.  Sent prescription for Ciprofloxacin 250 mg twice daily x3 days.  Told patient to hold on to this prescription and I would let her know if urinalysis is suspicious for uti.  She does not want to go through the weekend without any help. 4.  Prescribed Zofran 8 mg every 8 hours as scheduled.  #120 with a refill 5.  Prescribed Lomotil 1-2 tabs every 6 hours as scheduled.  #120 with a refill 6.  Started the patient on Creon 36,000 lipase units, 2 with a meal and one with a snack.  Did provide her with samples today. 7.  Ordered a GI pathogen panel. 8.  Discussed case with Dr. Carlean Purl today at time of patient's appointment.  We will need to have close follow-up with this patient.  She was given ER precautions including worsening abdominal pain, dizziness or syncope.  Office visit took 40mn due to extensive history and discussion with patient.  JEllouise Newer PA-C [facility] 03/28/2018, 11:26 AM  Cc: SMonico Blitz MD   Agree with Ms. Guenther's assessment and plan. CGatha Mayer MD, FMarval Regal

## 2018-03-31 LAB — GASTROINTESTINAL PATHOGEN PANEL PCR
C. difficile Tox A/B, PCR: NOT DETECTED
CAMPYLOBACTER, PCR: NOT DETECTED
Cryptosporidium, PCR: NOT DETECTED
E COLI (ETEC) LT/ST, PCR: NOT DETECTED
E COLI 0157, PCR: NOT DETECTED
E coli (STEC) stx1/stx2, PCR: NOT DETECTED
Giardia lamblia, PCR: NOT DETECTED
Norovirus, PCR: NOT DETECTED
Rotavirus A, PCR: NOT DETECTED
SALMONELLA, PCR: NOT DETECTED
SHIGELLA, PCR: NOT DETECTED

## 2018-04-07 ENCOUNTER — Inpatient Hospital Stay (HOSPITAL_COMMUNITY)
Admission: AD | Admit: 2018-04-07 | Discharge: 2018-04-14 | DRG: 481 | Disposition: A | Discharge status: Skilled Nursing Facility | Payer: Medicare Other | Source: Other Acute Inpatient Hospital | Attending: Family Medicine | Admitting: Family Medicine

## 2018-04-07 DIAGNOSIS — G4700 Insomnia, unspecified: Secondary | ICD-10-CM | POA: Diagnosis present

## 2018-04-07 DIAGNOSIS — E1122 Type 2 diabetes mellitus with diabetic chronic kidney disease: Secondary | ICD-10-CM | POA: Diagnosis present

## 2018-04-07 DIAGNOSIS — E782 Mixed hyperlipidemia: Secondary | ICD-10-CM | POA: Diagnosis present

## 2018-04-07 DIAGNOSIS — F329 Major depressive disorder, single episode, unspecified: Secondary | ICD-10-CM | POA: Diagnosis present

## 2018-04-07 DIAGNOSIS — Z886 Allergy status to analgesic agent status: Secondary | ICD-10-CM | POA: Diagnosis not present

## 2018-04-07 DIAGNOSIS — M25579 Pain in unspecified ankle and joints of unspecified foot: Secondary | ICD-10-CM

## 2018-04-07 DIAGNOSIS — I129 Hypertensive chronic kidney disease with stage 1 through stage 4 chronic kidney disease, or unspecified chronic kidney disease: Secondary | ICD-10-CM | POA: Diagnosis present

## 2018-04-07 DIAGNOSIS — Z885 Allergy status to narcotic agent status: Secondary | ICD-10-CM

## 2018-04-07 DIAGNOSIS — Z9049 Acquired absence of other specified parts of digestive tract: Secondary | ICD-10-CM

## 2018-04-07 DIAGNOSIS — W1830XA Fall on same level, unspecified, initial encounter: Secondary | ICD-10-CM | POA: Diagnosis present

## 2018-04-07 DIAGNOSIS — K210 Gastro-esophageal reflux disease with esophagitis: Secondary | ICD-10-CM | POA: Diagnosis present

## 2018-04-07 DIAGNOSIS — Z881 Allergy status to other antibiotic agents status: Secondary | ICD-10-CM

## 2018-04-07 DIAGNOSIS — Z66 Do not resuscitate: Secondary | ICD-10-CM | POA: Diagnosis present

## 2018-04-07 DIAGNOSIS — M109 Gout, unspecified: Secondary | ICD-10-CM | POA: Diagnosis present

## 2018-04-07 DIAGNOSIS — Z90710 Acquired absence of both cervix and uterus: Secondary | ICD-10-CM

## 2018-04-07 DIAGNOSIS — N183 Chronic kidney disease, stage 3 (moderate): Secondary | ICD-10-CM | POA: Diagnosis present

## 2018-04-07 DIAGNOSIS — E1140 Type 2 diabetes mellitus with diabetic neuropathy, unspecified: Secondary | ICD-10-CM | POA: Diagnosis present

## 2018-04-07 DIAGNOSIS — S72141A Displaced intertrochanteric fracture of right femur, initial encounter for closed fracture: Principal | ICD-10-CM | POA: Diagnosis present

## 2018-04-07 DIAGNOSIS — E1165 Type 2 diabetes mellitus with hyperglycemia: Secondary | ICD-10-CM | POA: Diagnosis not present

## 2018-04-07 DIAGNOSIS — D631 Anemia in chronic kidney disease: Secondary | ICD-10-CM | POA: Diagnosis present

## 2018-04-07 DIAGNOSIS — Z7982 Long term (current) use of aspirin: Secondary | ICD-10-CM

## 2018-04-07 DIAGNOSIS — N179 Acute kidney failure, unspecified: Secondary | ICD-10-CM | POA: Diagnosis present

## 2018-04-07 DIAGNOSIS — R112 Nausea with vomiting, unspecified: Secondary | ICD-10-CM | POA: Diagnosis not present

## 2018-04-07 DIAGNOSIS — K219 Gastro-esophageal reflux disease without esophagitis: Secondary | ICD-10-CM | POA: Diagnosis present

## 2018-04-07 DIAGNOSIS — I10 Essential (primary) hypertension: Secondary | ICD-10-CM | POA: Diagnosis present

## 2018-04-07 DIAGNOSIS — Z833 Family history of diabetes mellitus: Secondary | ICD-10-CM

## 2018-04-07 DIAGNOSIS — S72001S Fracture of unspecified part of neck of right femur, sequela: Secondary | ICD-10-CM | POA: Diagnosis not present

## 2018-04-07 DIAGNOSIS — Z882 Allergy status to sulfonamides status: Secondary | ICD-10-CM

## 2018-04-07 DIAGNOSIS — Z794 Long term (current) use of insulin: Secondary | ICD-10-CM | POA: Diagnosis not present

## 2018-04-07 DIAGNOSIS — E1121 Type 2 diabetes mellitus with diabetic nephropathy: Secondary | ICD-10-CM | POA: Diagnosis present

## 2018-04-07 DIAGNOSIS — K449 Diaphragmatic hernia without obstruction or gangrene: Secondary | ICD-10-CM | POA: Diagnosis present

## 2018-04-07 DIAGNOSIS — M25571 Pain in right ankle and joints of right foot: Secondary | ICD-10-CM | POA: Diagnosis not present

## 2018-04-07 DIAGNOSIS — S8990XA Unspecified injury of unspecified lower leg, initial encounter: Secondary | ICD-10-CM

## 2018-04-07 DIAGNOSIS — Z79899 Other long term (current) drug therapy: Secondary | ICD-10-CM

## 2018-04-07 DIAGNOSIS — N189 Chronic kidney disease, unspecified: Secondary | ICD-10-CM | POA: Diagnosis not present

## 2018-04-07 DIAGNOSIS — Z7952 Long term (current) use of systemic steroids: Secondary | ICD-10-CM | POA: Diagnosis not present

## 2018-04-07 DIAGNOSIS — D62 Acute posthemorrhagic anemia: Secondary | ICD-10-CM | POA: Diagnosis not present

## 2018-04-07 DIAGNOSIS — S72001A Fracture of unspecified part of neck of right femur, initial encounter for closed fracture: Secondary | ICD-10-CM

## 2018-04-07 DIAGNOSIS — K589 Irritable bowel syndrome without diarrhea: Secondary | ICD-10-CM | POA: Diagnosis present

## 2018-04-07 DIAGNOSIS — Z888 Allergy status to other drugs, medicaments and biological substances status: Secondary | ICD-10-CM

## 2018-04-08 ENCOUNTER — Other Ambulatory Visit: Payer: Self-pay

## 2018-04-08 ENCOUNTER — Encounter (HOSPITAL_COMMUNITY): Payer: Self-pay

## 2018-04-08 ENCOUNTER — Inpatient Hospital Stay (HOSPITAL_COMMUNITY): Payer: Medicare Other

## 2018-04-08 DIAGNOSIS — S72001A Fracture of unspecified part of neck of right femur, initial encounter for closed fracture: Secondary | ICD-10-CM | POA: Diagnosis present

## 2018-04-08 LAB — CBC
HCT: 31 % — ABNORMAL LOW (ref 36.0–46.0)
HEMOGLOBIN: 10.1 g/dL — AB (ref 12.0–15.0)
MCH: 30.2 pg (ref 26.0–34.0)
MCHC: 32.6 g/dL (ref 30.0–36.0)
MCV: 92.8 fL (ref 78.0–100.0)
Platelets: 197 10*3/uL (ref 150–400)
RBC: 3.34 MIL/uL — AB (ref 3.87–5.11)
RDW: 14.4 % (ref 11.5–15.5)
WBC: 9.5 10*3/uL (ref 4.0–10.5)

## 2018-04-08 LAB — ABO/RH: ABO/RH(D): O POS

## 2018-04-08 LAB — BASIC METABOLIC PANEL
Anion gap: 13 (ref 5–15)
BUN: 46 mg/dL — AB (ref 6–20)
CHLORIDE: 103 mmol/L (ref 101–111)
CO2: 22 mmol/L (ref 22–32)
Calcium: 9 mg/dL (ref 8.9–10.3)
Creatinine, Ser: 2.8 mg/dL — ABNORMAL HIGH (ref 0.44–1.00)
GFR calc Af Amer: 19 mL/min — ABNORMAL LOW (ref >60)
GFR calc non Af Amer: 17 mL/min — ABNORMAL LOW (ref >60)
GLUCOSE: 332 mg/dL — AB (ref 65–99)
POTASSIUM: 3.8 mmol/L (ref 3.5–5.1)
Sodium: 138 mmol/L (ref 135–145)

## 2018-04-08 LAB — TYPE AND SCREEN
ABO/RH(D): O POS
Antibody Screen: NEGATIVE

## 2018-04-08 LAB — GLUCOSE, CAPILLARY
GLUCOSE-CAPILLARY: 264 mg/dL — AB (ref 65–99)
GLUCOSE-CAPILLARY: 219 mg/dL — AB (ref 65–99)
Glucose-Capillary: 160 mg/dL — ABNORMAL HIGH (ref 65–99)
Glucose-Capillary: 228 mg/dL — ABNORMAL HIGH (ref 65–99)
Glucose-Capillary: 316 mg/dL — ABNORMAL HIGH (ref 65–99)
Glucose-Capillary: 344 mg/dL — ABNORMAL HIGH (ref 65–99)

## 2018-04-08 LAB — SURGICAL PCR SCREEN
MRSA, PCR: NEGATIVE
STAPHYLOCOCCUS AUREUS: NEGATIVE

## 2018-04-08 MED ORDER — HYDROXYZINE HCL 25 MG PO TABS
25.0000 mg | ORAL_TABLET | Freq: Three times a day (TID) | ORAL | Status: DC | PRN
  Administered 2018-04-08 – 2018-04-09 (×2): 25 mg via ORAL
  Filled 2018-04-08 (×3): qty 1

## 2018-04-08 MED ORDER — METOPROLOL TARTRATE 50 MG PO TABS: 100.0000 mg | ORAL_TABLET | Freq: Two times a day (BID) | ORAL | Status: DC

## 2018-04-08 MED ORDER — INSULIN ASPART 100 UNIT/ML ~~LOC~~ SOLN
0.0000 [IU] | Freq: Three times a day (TID) | SUBCUTANEOUS | Status: DC
  Administered 2018-04-08: 7 [IU] via SUBCUTANEOUS
  Administered 2018-04-09: 3 [IU] via SUBCUTANEOUS
  Administered 2018-04-09: 7 [IU] via SUBCUTANEOUS
  Administered 2018-04-09 (×2): 5 [IU] via SUBCUTANEOUS
  Administered 2018-04-10: 2 [IU] via SUBCUTANEOUS
  Administered 2018-04-10: 3 [IU] via SUBCUTANEOUS
  Administered 2018-04-10: 7 [IU] via SUBCUTANEOUS
  Administered 2018-04-11 (×2): 9 [IU] via SUBCUTANEOUS

## 2018-04-08 MED ORDER — HYDROMORPHONE HCL 1 MG/ML IJ SOLN
0.5000 mg | INTRAMUSCULAR | Status: DC | PRN
  Administered 2018-04-08 (×5): 0.5 mg via INTRAVENOUS
  Filled 2018-04-08 (×5): qty 0.5

## 2018-04-08 MED ORDER — HYDROCODONE-ACETAMINOPHEN 5-325 MG PO TABS
1.0000 | ORAL_TABLET | Freq: Four times a day (QID) | ORAL | Status: DC | PRN
  Administered 2018-04-08: 2 via ORAL
  Filled 2018-04-08 (×2): qty 2

## 2018-04-08 MED ORDER — VITAMIN D 1000 UNITS PO TABS
2000.0000 [IU] | ORAL_TABLET | Freq: Every day | ORAL | Status: DC
  Administered 2018-04-08 – 2018-04-14 (×6): 2000 [IU] via ORAL
  Filled 2018-04-08 (×6): qty 2

## 2018-04-08 MED ORDER — OXYCODONE HCL 5 MG PO TABS
5.0000 mg | ORAL_TABLET | ORAL | Status: DC | PRN
  Administered 2018-04-08 – 2018-04-09 (×2): 10 mg via ORAL
  Filled 2018-04-08 (×2): qty 2

## 2018-04-08 MED ORDER — COLESEVELAM HCL 625 MG PO TABS: 1250.0000 mg | ORAL_TABLET | Freq: Two times a day (BID) | ORAL | Status: DC

## 2018-04-08 MED ORDER — GABAPENTIN 100 MG PO CAPS: 100.0000 mg | ORAL_CAPSULE | Freq: Three times a day (TID) | ORAL | Status: DC

## 2018-04-08 MED ORDER — CYANOCOBALAMIN 1000 MCG/ML IJ SOLN
1000.0000 ug | INTRAMUSCULAR | Status: DC
  Administered 2018-04-11: 1000 ug via INTRAMUSCULAR
  Filled 2018-04-08: qty 1

## 2018-04-08 MED ORDER — PANTOPRAZOLE SODIUM 40 MG PO TBEC
80.0000 mg | DELAYED_RELEASE_TABLET | Freq: Every day | ORAL | Status: DC
  Administered 2018-04-08 – 2018-04-14 (×7): 80 mg via ORAL
  Filled 2018-04-08 (×7): qty 2

## 2018-04-08 MED ORDER — HYDROMORPHONE HCL 1 MG/ML IJ SOLN
1.0000 mg | INTRAMUSCULAR | Status: DC | PRN
  Administered 2018-04-08 – 2018-04-13 (×9): 1 mg via INTRAVENOUS
  Filled 2018-04-08 (×9): qty 1

## 2018-04-08 MED ORDER — INSULIN ASPART 100 UNIT/ML ~~LOC~~ SOLN
0.0000 [IU] | SUBCUTANEOUS | Status: DC
  Administered 2018-04-08: 2 [IU] via SUBCUTANEOUS
  Administered 2018-04-08: 7 [IU] via SUBCUTANEOUS
  Administered 2018-04-08: 2 [IU] via SUBCUTANEOUS
  Administered 2018-04-08: 5 [IU] via SUBCUTANEOUS
  Administered 2018-04-08: 3 [IU] via SUBCUTANEOUS

## 2018-04-08 MED ORDER — CITALOPRAM HYDROBROMIDE 20 MG PO TABS
20.0000 mg | ORAL_TABLET | Freq: Every day | ORAL | Status: DC
Start: 2018-04-08 — End: 2018-04-14
  Administered 2018-04-08 – 2018-04-14 (×7): 20 mg via ORAL
  Filled 2018-04-08 (×7): qty 1

## 2018-04-08 MED ORDER — DIPHENHYDRAMINE HCL 25 MG PO CAPS
25.0000 mg | ORAL_CAPSULE | Freq: Once | ORAL | Status: AC
  Administered 2018-04-08: 25 mg via ORAL
  Filled 2018-04-08: qty 1

## 2018-04-08 MED ORDER — SODIUM CHLORIDE 0.9 % IV SOLN
1.0000 g | Freq: Every day | INTRAVENOUS | Status: DC
  Administered 2018-04-08 – 2018-04-13 (×7): 1 g via INTRAVENOUS
  Filled 2018-04-08 (×7): qty 1

## 2018-04-08 MED ORDER — ONDANSETRON HCL 4 MG/2ML IJ SOLN
4.0000 mg | Freq: Four times a day (QID) | INTRAMUSCULAR | Status: DC | PRN
  Administered 2018-04-08 – 2018-04-11 (×3): 4 mg via INTRAVENOUS
  Filled 2018-04-08 (×3): qty 2

## 2018-04-08 MED ORDER — DICYCLOMINE HCL 10 MG PO CAPS: 10.0000 mg | ORAL_CAPSULE | Freq: Three times a day (TID) | ORAL | Status: DC

## 2018-04-08 MED ORDER — DIAZEPAM 5 MG PO TABS
5.0000 mg | ORAL_TABLET | Freq: Two times a day (BID) | ORAL | Status: DC | PRN
  Administered 2018-04-08 – 2018-04-11 (×4): 5 mg via ORAL
  Filled 2018-04-08 (×4): qty 1

## 2018-04-08 MED ORDER — ATORVASTATIN CALCIUM 40 MG PO TABS: 80.0000 mg | ORAL_TABLET | Freq: Every day | ORAL | Status: DC

## 2018-04-08 MED ORDER — METOPROLOL TARTRATE 50 MG PO TABS
100.0000 mg | ORAL_TABLET | Freq: Two times a day (BID) | ORAL | Status: DC
  Administered 2018-04-08 – 2018-04-14 (×12): 100 mg via ORAL
  Filled 2018-04-08 (×13): qty 2

## 2018-04-08 MED ORDER — INSULIN DETEMIR 100 UNIT/ML ~~LOC~~ SOLN
40.0000 [IU] | Freq: Every day | SUBCUTANEOUS | Status: DC
  Administered 2018-04-08 – 2018-04-10 (×4): 40 [IU] via SUBCUTANEOUS
  Filled 2018-04-08 (×5): qty 0.4

## 2018-04-08 MED ORDER — ALLOPURINOL 100 MG PO TABS
50.0000 mg | ORAL_TABLET | Freq: Every day | ORAL | Status: DC
  Administered 2018-04-08 – 2018-04-14 (×7): 50 mg via ORAL
  Filled 2018-04-08 (×7): qty 0.5

## 2018-04-08 MED ORDER — LIRAGLUTIDE 18 MG/3ML ~~LOC~~ SOPN: 1.8000 mg | PEN_INJECTOR | Freq: Every day | SUBCUTANEOUS | Status: DC

## 2018-04-08 NOTE — Progress Notes (Signed)
Patient seen and evaluated earlier this AM. Continue hip fx pathway. Per H and P Dr. Alvan Dame consulted.  Pt presented with mechanical fall and has right hip fx.  VSS Gen: Pt in nad, alert and awake CV: s1 and s2 wnl, no rubs Pulm: No increased wob, no wheezes  Velvet Bathe, MD

## 2018-04-08 NOTE — H&P (Addendum)
History and Physical    Heather [name] DOB: 1950-12-04 DOA: 04/07/2018  PCP: [name], MD  Patient coming from: Home  I have personally briefly reviewed patient's old medical records in [place]  Chief Complaint: R hip pain  HPI: Heather [name] is a 67 y.o. female with medical history significant of CKD stage 3 due to DM2, HTN, chronic N/V. Previous R femur fx s/p repair.  Patient presents to the ED at [facility] after mechanical fall.  R hip pain, deformity, inability to ambulate following fall which occurred just PTA.  Pain severe, constant, worse with movement.   ED Course: R hip fx.  Dr. Alvan Dame called in consult and will see pt for probable surgery in AM.  Hospitalist asked to admit.   Review of Systems: As per HPI otherwise 10 point review of systems negative.   Past Medical History:  Diagnosis Date  . Acute renal failure superimposed on stage 3 chronic kidney disease ([place]) 01/21/2016  . B12 deficiency   . Cellulitis and abscess    Abdomen and buttocks  . CKD (chronic kidney disease) stage 3, GFR 30-59 ml/min (HCC)   . Closed fracture of distal end of femur, unspecified fracture morphology, initial encounter ([place])   . Depression   . Diabetic neuropathy ([place])   . DJD (degenerative joint disease)    Right forminal stenosis C4-5  . Essential hypertension   . Femur fracture, right ([place]) 03/30/2017  . Folliculitis   . Gout   . Headache(784.0)   . Hiatal hernia   . History of bronchitis   . History of cardiac catheterization    No significant CAD 2012  . Insomnia   . Irritable bowel syndrome   . Mixed hyperlipidemia   . Osteoarthritis   . Palpitations   . Reflux esophagitis   . Tinnitus    Right  . Type 2 diabetes mellitus [facility])     Past Surgical History:  Procedure Laterality Date  . c-spine surgery     03/2007  . CARDIAC CATHETERIZATION    . CHOLECYSTECTOMY    . COLONOSCOPY  04/21/2011; 05/29/11   6/12: [place] - ?AVM at IC  valve, inflammatory changes at [facility] valve [facility]); 7/12 - [place]; IC valve erosions, look chronic and probably Crohn's per path  . colonoscopy  2017   [place]: random biopsies normal. TI normal  . ESOPHAGOGASTRODUODENOSCOPY  05/29/11   normal  . ESOPHAGOGASTRODUODENOSCOPY N/A 01/21/2016   Dr. Michail Sermon: normal  . IR FLUORO GUIDE CV LINE RIGHT  04/01/2017  . IR US GUIDE VASC ACCESS RIGHT  04/01/2017  . JOINT REPLACEMENT    . ORIF FEMUR FRACTURE Right 03/31/2017   Procedure: OPEN REDUCTION INTERNAL FIXATION (ORIF) DISTAL FEMUR FRACTURE;  Surgeon: Paralee Cancel, MD;  Location: [place];  Service: Orthopedics;  Laterality: Right;  . Right knee arthroscopic surgery    . TOTAL ABDOMINAL HYSTERECTOMY       reports that she has never smoked. She has never used smokeless tobacco. She reports that she does not drink alcohol or use drugs.  Allergies  Allergen Reactions  . Duloxetine Hcl Swelling  . Fluocinolone Swelling  . Acetaminophen Itching and Swelling    Pt states "some swelling"  . Amlodipine Besylate Swelling    Swelling of feet, fluid retention  . Ciprofloxacin Nausea And Vomiting and Swelling    Swelling of face, jaw, and lips  . Hydrocodone-Acetaminophen Itching  . Metformin Other (See Comments)    REACTION: GI UPSET  .  Nsaids Other (See Comments)    "HAS BLEEDING ULCERS"  . Quinolones Swelling  . Sulfamethoxazole Swelling    Swelling of feet, legs  . Sulfonamide Derivatives Swelling  . Valsartan Swelling  . Cozaar [Losartan] Swelling and Rash  . Lisinopril Swelling and Rash    Oral swelling, and red streaks on arms/stomach    Family History  Problem Relation Age of Onset  . Diabetes Mother   . Colon cancer Brother        POSSIBLY. Patient states diagnosed at age 51, then later states age 25. unclear and limited historian     Prior to Admission medications   Medication Sig Start Date End Date Taking? Authorizing Provider  allopurinol (ZYLOPRIM) 100 MG tablet  Take 0.5 tablets (50 mg total) by mouth daily. 03/13/18   Barton Dubois, MD  aspirin EC 81 MG tablet Take 81 mg by mouth daily.    [provider]  BD PEN NEEDLE NANO U/F 32G X 4 MM MISC  02/16/18   [provider]  Blood Glucose Monitoring Suppl (ONETOUCH VERIO) w/Device KIT 1 each by Does not apply route as needed. Test 4 times daily 11/30/16   Cassandria Anger, MD  Cholecalciferol (VITAMIN D) 2000 units CAPS Take 2,000 Units by mouth daily.     [provider]  citalopram (CELEXA) 40 MG tablet Take 0.5 tablets (20 mg total) by mouth daily. 04/03/17   Rosita Fire, MD  colesevelam [facility]) 625 MG tablet Take 2 tablets (1,250 mg total) by mouth 2 (two) times daily with a meal. 03/13/18   Barton Dubois, MD  cyanocobalamin (,VITAMIN B-12,) 1000 MCG/ML injection Inject 1,000 mcg into the muscle every 30 (thirty) days.      [provider]  diazepam (VALIUM) 5 MG tablet Take 1 tablet (5 mg total) by mouth 2 (two) times daily as needed for anxiety. 04/03/17   Rosita Fire, MD  dicyclomine (BENTYL) 10 MG capsule Take 1 capsule (10 mg total) by mouth 4 (four) times daily -  before meals and at bedtime. 03/13/18   Barton Dubois, MD  diphenoxylate-atropine (LOMOTIL) 2.5-0.025 MG tablet Take 1-2 tablets by mouth every 6 hours. 03/28/18   Levin Erp, PA  esomeprazole (NEXIUM) 40 MG capsule Take 40 mg by mouth daily before breakfast.      [provider]  furosemide (LASIX) 40 MG tablet Take 1 tablet (40 mg total) by mouth daily. 01/26/16   Reyne Dumas, MD  gabapentin (NEURONTIN) 100 MG capsule Take 1 capsule (100 mg total) by mouth 3 (three) times daily. 01/03/18   Carmin Muskrat, MD  glucose blood (ONE TOUCH TEST STRIPS) test strip Use as instructed 4 x daily. E11.65 11/30/16   Cassandria Anger, MD  Insulin Detemir (LEVEMIR FLEXTOUCH) 100 UNIT/ML Pen Inject 80 Units into the skin at bedtime. 04/03/17   Rosita Fire, MD    Insulin Lispro (HUMALOG KWIKPEN [place]) Inject into the skin 3 (three) times daily with meals. Per home sliding scale    [provider]  lipase/protease/amylase (CREON) 36000 UNITS CPEP capsule Take 2 with meals and 1 with snacks 03/28/18   Levin Erp, PA  liraglutide (VICTOZA) 18 MG/3ML SOPN USE 1.8 MG EVERY DAY AS DIRECTED 05/30/17   Cassandria Anger, MD  loperamide (IMODIUM) 2 MG capsule Take 1 capsule (2 mg total) by mouth as needed for diarrhea or loose stools (no more than 8 mg in 24 hours.). 03/13/18   Barton Dubois, MD  metoprolol (LOPRESSOR) 100 MG tablet Take 100 mg by mouth 2 (two) times daily.     [provider]  ondansetron (ZOFRAN ODT) 8 MG disintegrating tablet Dissolve 1 tab on the tongue every 6 hours for nausea. 03/28/18   Levin Erp, PA  ondansetron (ZOFRAN) 4 MG tablet 1-2 every 6 hrs as needed for nausea, cramps, diarrhea 06/26/17   Gatha Mayer, MD  potassium chloride SA (K-DUR,KLOR-CON) 20 MEQ tablet Take 20 mEq by mouth daily.    [provider]  predniSONE (DELTASONE) 20 MG tablet Take 1 tablet (20 mg total) by mouth daily with breakfast. 03/14/18   Barton Dubois, MD    Physical Exam: Vitals:   04/07/18 2343 04/07/18 2358 04/07/18 2359  BP:  (!) 89/44 (!) 108/50  Pulse:  92 92  Resp:   17  Temp:  98.4 F (36.9 C)   TempSrc:  Oral   SpO2:  96% 95%  Weight: 69.9 kg (154 lb)    Height: _0  (1.6 m)      Constitutional: NAD, calm, comfortable Eyes: PERRL, lids and conjunctivae normal ENMT: Mucous membranes are moist. Posterior pharynx clear of any exudate or lesions.Normal dentition.  Neck: normal, supple, no masses, no thyromegaly Respiratory: clear to auscultation bilaterally, no wheezing, no crackles. Normal respiratory effort. No accessory muscle use.  Cardiovascular: Regular rate and rhythm, no murmurs / rubs / gallops. No extremity edema. 2+ pedal pulses. No carotid bruits.  Abdomen: no tenderness, no  masses palpated. No hepatosplenomegaly. Bowel sounds positive.  Musculoskeletal: R hip TTP. Skin: no rashes, lesions, ulcers. No induration Neurologic: CN 2-12 grossly intact. Sensation intact, DTR normal. Strength 5/5 in all 4.  Psychiatric: Normal judgment and insight. Alert and oriented x 3. Normal mood.    Labs on Admission: I have personally reviewed following labs and imaging studies  CBC: No results for input(s): WBC, NEUTROABS, HGB, HCT, MCV, PLT in the last 168 hours. Basic Metabolic Panel: No results for input(s): NA, K, CL, CO2, GLUCOSE, BUN, CREATININE, CALCIUM, MG, PHOS in the last 168 hours. GFR: Estimated Creatinine Clearance: 38.7 mL/min (A) (by C-G formula based on SCr of 1.36 mg/dL (H)). Liver Function Tests: No results for input(s): AST, ALT, ALKPHOS, BILITOT, PROT, ALBUMIN in the last 168 hours. No results for input(s): LIPASE, AMYLASE in the last 168 hours. No results for input(s): AMMONIA in the last 168 hours. Coagulation Profile: No results for input(s): INR, PROTIME in the last 168 hours. Cardiac Enzymes: No results for input(s): CKTOTAL, CKMB, CKMBINDEX, TROPONINI in the last 168 hours. BNP (last 3 results) No results for input(s): PROBNP in the last 8760 hours. HbA1C: No results for input(s): HGBA1C in the last 72 hours. CBG: Recent Labs  Lab 04/08/18 0022  GLUCAP 316*   Lipid Profile: No results for input(s): CHOL, HDL, LDLCALC, TRIG, CHOLHDL, LDLDIRECT in the last 72 hours. Thyroid Function Tests: No results for input(s): TSH, T4TOTAL, FREET4, T3FREE, THYROIDAB in the last 72 hours. Anemia Panel: No results for input(s): VITAMINB12, FOLATE, FERRITIN, TIBC, IRON, RETICCTPCT in the last 72 hours. Urine analysis:    Component Value Date/Time   COLORURINE Orange (A) 03/28/2018 1224   APPEARANCEUR [place] (A) 03/28/2018 1224   LABSPEC <=1.005 (A) 03/28/2018 1224   PHURINE 5.0 03/28/2018 1224   GLUCOSEU NEGATIVE 03/28/2018 1224   HGBUR MODERATE  (A) 03/28/2018 1224   BILIRUBINUR NEGATIVE 03/28/2018 1224   KETONESUR NEGATIVE 03/28/2018 1224   PROTEINUR NEGATIVE 03/05/2018 1359   UROBILINOGEN 1.0  03/28/2018 1224   NITRITE POSITIVE (A) 03/28/2018 1224   LEUKOCYTESUR LARGE (A) 03/28/2018 1224    Radiological Exams on Admission: No results found.  EKG: Independently reviewed.  Assessment/Plan Principal Problem:   Closed right hip fracture (HCC) Active Problems:   Chronic Nausea and Vomiting - ? gastroapresis   Essential hypertension   Diabetes mellitus with chronic kidney disease (HCC)    1. Closed R hip fx - complicated by presence of prior hip fx ORIF. 1. Hip fx pathway 2. CD with images in chart 3. NPO after MN 2. HTN - continue home metoprolol, hold lasix 3. DM2 -  1. Half home levemir (40 QHS) 2. Sensitive SSI q4h while NPO 3. Stop Victoza which it looks like GI wanted her off of anyhow as of office visit 11 days ago. 4. CKD stage 3 - 1. Creat up to 2.69 today, looks like baseline is just under 2 2. Holding lasix 5. Chronic N/V - 1. zofran PRN 2. Reglan if that fails  DVT prophylaxis: SCDs Code Status: Full Family Communication: No family in room Disposition Plan: Home after admit Consults called: Dr. Alvan Dame Admission status: Admit to inpatient   [place], [place] [facility] Pager [phone]  If 7AM-7PM, please contact day team taking care of patient [email or web address] Password [email or web address]  04/08/2018, 12:40 AM

## 2018-04-08 NOTE — Progress Notes (Signed)
Nutrition Brief Note  Patient identified for consult per Hip Fracture Protocol.   Wt Readings from Last 15 Encounters:  04/07/18 154 lb (69.9 kg)  03/28/18 149 lb 6 oz (67.8 kg)  03/04/18 142 lb 13.7 oz (64.8 kg)  02/17/18 154 lb (69.9 kg)  01/03/18 158 lb (71.7 kg)  07/29/17 157 lb 3.2 oz (71.3 kg)  06/26/17 158 lb 6.4 oz (71.8 kg)  03/30/17 164 lb (74.4 kg)  03/08/17 164 lb 2 oz (74.4 kg)  03/07/17 164 lb (74.4 kg)  01/04/17 164 lb 2 oz (74.4 kg)  12/25/16 164 lb 12.8 oz (74.8 kg)  12/25/16 166 lb (75.3 kg)  11/30/16 165 lb (74.8 kg)  08/29/16 167 lb (75.8 kg)    Body mass index is 27.28 kg/m. Patient meets criteria for overweight based on current BMI. Skin WDL. Pt with hx of CKD stage 3 2/2 Type 2 DM, HTN, chronic N/V, and a previous femur fx s/p repair. She had a mechanical fall PTA with subsequent R hip fx.  Current diet order is NPO pending surgery.  Medications reviewed; 2000 units vitamin D/day, 1000 units vitamin B12 every 30 days, sliding scale Novolog, 40 units Levemir/day, 80 mg oral Protonix/day.  Labs reviewed; CBGs: 316, 264, and 219 mg/dL today, BUN: 46 mg/dL, creatinine: 2.8 mg/dL, GFR: 17 mL/min.   No nutrition interventions warranted at this time. If nutrition issues arise, please consult RD.     Jarome Matin, MS, RD, LDN, [facility] Inpatient Clinical Dietitian Pager # [phone] After hours/weekend pager # [phone]

## 2018-04-09 LAB — GLUCOSE, CAPILLARY
GLUCOSE-CAPILLARY: 262 mg/dL — AB (ref 65–99)
GLUCOSE-CAPILLARY: 259 mg/dL — AB (ref 65–99)
Glucose-Capillary: 243 mg/dL — ABNORMAL HIGH (ref 65–99)
Glucose-Capillary: 327 mg/dL — ABNORMAL HIGH (ref 65–99)

## 2018-04-09 MED ORDER — HYDROMORPHONE HCL 2 MG PO TABS
1.0000 mg | ORAL_TABLET | ORAL | Status: DC | PRN
  Administered 2018-04-09 – 2018-04-12 (×9): 2 mg via ORAL
  Filled 2018-04-09 (×9): qty 1

## 2018-04-09 MED ORDER — POVIDONE-IODINE 10 % EX SWAB
2.0000 "application " | Freq: Once | CUTANEOUS | Status: AC
  Administered 2018-04-10: 2 via TOPICAL

## 2018-04-09 MED ORDER — CHLORHEXIDINE GLUCONATE 4 % EX LIQD
60.0000 mL | Freq: Once | CUTANEOUS | Status: AC
  Administered 2018-04-10: 4 via TOPICAL

## 2018-04-09 MED ORDER — CEFAZOLIN SODIUM-DEXTROSE 2-4 GM/100ML-% IV SOLN
2.0000 g | INTRAVENOUS | Status: AC
  Administered 2018-04-10: 2 g via INTRAVENOUS
  Filled 2018-04-09 (×2): qty 100

## 2018-04-09 MED ORDER — DIPHENHYDRAMINE HCL 25 MG PO CAPS
25.0000 mg | ORAL_CAPSULE | Freq: Four times a day (QID) | ORAL | Status: DC | PRN
  Administered 2018-04-09: 25 mg via ORAL
  Filled 2018-04-09: qty 1

## 2018-04-09 NOTE — Progress Notes (Signed)
     Subjective:      Patient reports pain as moderate, pain not fully controled. Patient is very itchy from the medications. Otherwise no events throughout the night. Will plan on surgery tomorrow. Have discussed the procedure with regards to having to remove the retrograde nail and then ORIF of the right hip fracture.      Objective:   VITALS:   Vitals:   04/09/18 0001 04/09/18 0608  BP: (!) 141/56 (!) 114/59  Pulse: 99 79  Resp:    Temp:  97.8 F (36.6 C)  SpO2: 94% 100%    Dorsiflexion/Plantar flexion intact No cellulitis present Compartment soft  LABS Recent Labs    04/08/18 0056  HGB 10.1*  HCT 31.0*  WBC 9.5  PLT 197    Recent Labs    04/08/18 0056  NA 138  K 3.8  BUN 46*  CREATININE 2.80*  GLUCOSE 332*     Assessment/Plan: Right hip fracture with h/o distal femur fx with retrograde nail in place   NPO after midnight Plan on surgery tomorrow afternoon per Dr. Alvan Dame Urgent Ortho order set placed with consent Will try to change her pain medications to see if her itching will resolve She wishes to go back on Benadryl as it is what she is used to using     Heather [name]. Heather [name]   PAC  04/09/2018, 8:34 AM

## 2018-04-09 NOTE — Progress Notes (Signed)
Inpatient Diabetes Program Recommendations  AACE/ADA: New Consensus Statement on Inpatient Glycemic Control (2015)  Target Ranges:  Prepandial:   less than 140 mg/dL      Peak postprandial:   less than 180 mg/dL (1-2 hours)      Critically ill patients:  140 - 180 mg/dL   Results for Bolle, Timiya A "[name]" (MRN [id number]) as of 04/09/2018 11:10  Ref. Range 04/08/2018 07:49 04/08/2018 11:55 04/08/2018 16:13 04/08/2018 22:15 04/09/2018 07:41  Glucose-Capillary Latest Ref Range: 65 - 99 mg/dL 219 (H) 160 (H) 228 (H) 344 (H) 262 (H)   Review of Glycemic Control  Diabetes history: DM 2 Outpatient Diabetes medications: Levemir 80 units Daily, Humalog 21-40 units tid, Victoza 1.8 mg Daily Current orders for Inpatient glycemic control: Levemir 40 units qhs, Novolog Sensitive Correction 0-9 units tid and hs  Inpatient Diabetes Program Recommendations:    Glucose 200-300 range with half of home dose of Levemir.  Please consider increasing Levemir to 50 units and add Novolog 5 units tid meal coverage if patient is consuming at least 50% of meals.  Thanks,  Tama Headings RN, MSN, BC-ADM, [facility] Inpatient Diabetes Coordinator Team Pager [phone] (8a-5p)

## 2018-04-09 NOTE — Progress Notes (Signed)
PROGRESS NOTE    Heather [name]  PYP:[id number] DOB: 1951-06-28 DOA: 04/07/2018 PCP: [name], MD    Brief Narrative:  67 y.o. female with medical history significant of CKD stage 3 due to DM2, HTN, chronic N/V. Previous R femur fx s/p repair.  Patient presents to the ED at [facility] after mechanical fall. Pt dx with Intertrochanteric fracture right femur on evaluation.    Assessment & Plan:   Principal Problem:   Closed right hip fracture (HCC) - Intertrochanteric fracture right femur - continue hip fx order set.  - operation for tomorrow.  Active Problems:   Chronic Nausea and Vomiting  - no complaints reported today. Stable.    Essential hypertension - Pt on lopressor    Diabetes mellitus with chronic kidney disease ([place]) - Pt on Levemir and SSI  DVT prophylaxis: defer to ortho  Code Status: dnr Family Communication: none at bedside Disposition Plan: pending ortho and PT recs   Consultants:   Ortho   Procedures: pending   Antimicrobials: Rocephin  Subjective: Pt has no new complaints.  Objective: Vitals:   04/09/18 0001 04/09/18 0608 04/09/18 0944 04/09/18 1318  BP: (!) 141/56 (!) 114/59 (!) 129/57 (!) 133/52  Pulse: 99 79 82 90  Resp:   16 16  Temp:  97.8 F (36.6 C) 98.9 F (37.2 C) 98.1 F (36.7 C)  TempSrc:  Oral Oral Oral  SpO2: 94% 100% 100% 98%  Weight:      Height:        Intake/Output Summary (Last 24 hours) at 04/09/2018 1358 Last data filed at 04/09/2018 0831 Gross per 24 hour  Intake 750 ml  Output 850 ml  Net -100 ml   Filed Weights   04/07/18 2343  Weight: 69.9 kg (154 lb)    Examination:  General exam: Appears calm and comfortable, in nad. Respiratory system: Clear to auscultation. Respiratory effort normal. Cardiovascular system: S1 & S2 heard, RRR. No JVD, murmurs Gastrointestinal system: Abdomen is nondistended, soft and nontender.  Central nervous system: Alert and oriented. No focal neurological  deficits. Extremities: warm, no cyanosis Skin: No rashes, lesions or ulcers, on limited exam. Psychiatry:  Mood & affect appropriate.   Data Reviewed: I have personally reviewed following labs and imaging studies  CBC: Recent Labs  Lab 04/08/18 0056  WBC 9.5  HGB 10.1*  HCT 31.0*  MCV 92.8  PLT 245   Basic Metabolic Panel: Recent Labs  Lab 04/08/18 0056  NA 138  K 3.8  CL 103  CO2 22  GLUCOSE 332*  BUN 46*  CREATININE 2.80*  CALCIUM 9.0   GFR: Estimated Creatinine Clearance: 18.8 mL/min (A) (by C-G formula based on SCr of 2.8 mg/dL (H)). Liver Function Tests: No results for input(s): AST, ALT, ALKPHOS, BILITOT, PROT, ALBUMIN in the last 168 hours. No results for input(s): LIPASE, AMYLASE in the last 168 hours. No results for input(s): AMMONIA in the last 168 hours. Coagulation Profile: No results for input(s): INR, PROTIME in the last 168 hours. Cardiac Enzymes: No results for input(s): CKTOTAL, CKMB, CKMBINDEX, TROPONINI in the last 168 hours. BNP (last 3 results) No results for input(s): PROBNP in the last 8760 hours. HbA1C: No results for input(s): HGBA1C in the last 72 hours. CBG: Recent Labs  Lab 04/08/18 1155 04/08/18 1613 04/08/18 2215 04/09/18 0741 04/09/18 1154  GLUCAP 160* 228* 344* 262* 259*   Lipid Profile: No results for input(s): CHOL, HDL, LDLCALC, TRIG, CHOLHDL, LDLDIRECT in the last 72 hours. Thyroid Function  Tests: No results for input(s): TSH, T4TOTAL, FREET4, T3FREE, THYROIDAB in the last 72 hours. Anemia Panel: No results for input(s): VITAMINB12, FOLATE, FERRITIN, TIBC, IRON, RETICCTPCT in the last 72 hours. Sepsis Labs: No results for input(s): PROCALCITON, LATICACIDVEN in the last 168 hours.  Recent Results (from the past 240 hour(s))  Surgical PCR screen     Status: None   Collection Time: 04/08/18  6:28 AM  Result Value Ref Range Status   MRSA, PCR NEGATIVE NEGATIVE Final   Staphylococcus aureus NEGATIVE NEGATIVE Final     Comment: (NOTE) The Xpert SA Assay (FDA approved for NASAL specimens in patients 41 years of age and older), is one component of a comprehensive surveillance program. It is not intended to diagnose infection nor to guide or monitor treatment. Performed at [facility], [place] [address], [place], [place] [address]          Radiology Studies: Dg Pelvis Portable  Result Date: 04/08/2018 CLINICAL DATA:  Closed right hip fracture EXAM: PORTABLE PELVIS 1-2 VIEWS COMPARISON:  CT abdomen pelvis 01/08/2018 FINDINGS: Intertrochanteric fracture right femur with mild displacement and angulation. Mild degenerative change in the right hip joint. Locking intramedullary rod in the right femur. No other pelvic fracture IMPRESSION: Intertrochanteric fracture right femur Electronically Signed   By: Franchot Gallo M.D.   On: 04/08/2018 09:15   Dg Femur, Min 2 Views Right  Result Date: 04/08/2018 CLINICAL DATA:  Closed hip fracture right preop EXAM: RIGHT FEMUR 2 VIEWS COMPARISON:  04/08/2018.  03/31/2017 FINDINGS: Right knee replacement. Locking intramedullary rod in the right femur. Intertrochanteric fracture right femur with mild displacement and angulation. Atherosclerotic disease IMPRESSION: Intertrochanteric fracture right femur Electronically Signed   By: Franchot Gallo M.D.   On: 04/08/2018 09:17        Scheduled Meds: . allopurinol  50 mg Oral Daily  . cholecalciferol  2,000 Units Oral Daily  . citalopram  20 mg Oral Daily  . [START ON 04/11/2018] cyanocobalamin  1,000 mcg Intramuscular Q30 days  . insulin aspart  0-9 Units Subcutaneous TID WC & HS  . insulin detemir  40 Units Subcutaneous QHS  . metoprolol tartrate  100 mg Oral BID  . pantoprazole  80 mg Oral Q1200   Continuous Infusions: . cefTRIAXone (ROCEPHIN)  IV Stopped (04/09/18 0005)     LOS: 2 days    Time spent: 15 min    Velvet Bathe, MD [facility] Pager [phone]  If 7PM-7AM, please  contact night-coverage [email or web address] Password [email or web address] 04/09/2018, 1:58 PM

## 2018-04-09 NOTE — Progress Notes (Signed)
[facility] reported an undocumented waste of 1mg  of Dilaudid PO, but they pt was given 2mg  of Dilaudid PO and 2mg  of Dilaudid PO was pulled from [facility] and documented. Amy Valetta Fuller was my witness.

## 2018-04-09 NOTE — Progress Notes (Signed)
Pt has become slightly confused so I reached out to the pt's Husband, Jeneen Rinks. I left him a voicemail in hopes that he'll call back to complete the surgical consent form.

## 2018-04-09 NOTE — Progress Notes (Signed)
Patient oxygen sat was found to be low. Pt was placed on continuous pulse oximetry and oxygen via nasal cannula at 3 L/pm. Continued to monitor patient and patient was able to tolerate 2 L/pm with oxygen saturation staying above 92%. Will continue with continuous pulse ox and closely monitor. Will continue to encourage pt to use incentive spirometer and cough and deep breathe.  Talli Kimmer RN, 04/09/18 0107

## 2018-04-10 ENCOUNTER — Inpatient Hospital Stay (HOSPITAL_COMMUNITY): Payer: Medicare Other | Admitting: Certified Registered Nurse Anesthetist

## 2018-04-10 ENCOUNTER — Inpatient Hospital Stay (HOSPITAL_COMMUNITY): Payer: Medicare Other

## 2018-04-10 ENCOUNTER — Encounter (HOSPITAL_COMMUNITY)
Admission: AD | Disposition: A | Discharge status: Skilled Nursing Facility | Payer: Self-pay | Source: Other Acute Inpatient Hospital | Attending: Family Medicine

## 2018-04-10 ENCOUNTER — Encounter (HOSPITAL_COMMUNITY): Payer: Self-pay | Admitting: *Deleted

## 2018-04-10 DIAGNOSIS — I10 Essential (primary) hypertension: Secondary | ICD-10-CM

## 2018-04-10 DIAGNOSIS — S72001S Fracture of unspecified part of neck of right femur, sequela: Secondary | ICD-10-CM

## 2018-04-10 DIAGNOSIS — Z794 Long term (current) use of insulin: Secondary | ICD-10-CM

## 2018-04-10 DIAGNOSIS — E1122 Type 2 diabetes mellitus with diabetic chronic kidney disease: Secondary | ICD-10-CM

## 2018-04-10 HISTORY — PX: FEMUR IM NAIL: SHX1597

## 2018-04-10 HISTORY — PX: HARDWARE REMOVAL: SHX979

## 2018-04-10 LAB — CBC
HCT: 27.4 % — ABNORMAL LOW (ref 36.0–46.0)
Hemoglobin: 9 g/dL — ABNORMAL LOW (ref 12.0–15.0)
MCH: 30.2 pg (ref 26.0–34.0)
MCHC: 32.8 g/dL (ref 30.0–36.0)
MCV: 91.9 fL (ref 78.0–100.0)
Platelets: 166 10*3/uL (ref 150–400)
RBC: 2.98 MIL/uL — ABNORMAL LOW (ref 3.87–5.11)
RDW: 14.1 % (ref 11.5–15.5)
WBC: 7.6 10*3/uL (ref 4.0–10.5)

## 2018-04-10 LAB — GLUCOSE, CAPILLARY
GLUCOSE-CAPILLARY: 245 mg/dL — AB (ref 65–99)
GLUCOSE-CAPILLARY: 195 mg/dL — AB (ref 65–99)
Glucose-Capillary: 188 mg/dL — ABNORMAL HIGH (ref 65–99)
Glucose-Capillary: 327 mg/dL — ABNORMAL HIGH (ref 65–99)

## 2018-04-10 LAB — BASIC METABOLIC PANEL
Anion gap: 10 (ref 5–15)
BUN: 40 mg/dL — ABNORMAL HIGH (ref 6–20)
CHLORIDE: 104 mmol/L (ref 101–111)
CO2: 22 mmol/L (ref 22–32)
CREATININE: 1.95 mg/dL — AB (ref 0.44–1.00)
Calcium: 9.4 mg/dL (ref 8.9–10.3)
GFR calc Af Amer: 30 mL/min — ABNORMAL LOW (ref >60)
GFR, EST NON AFRICAN AMERICAN: 26 mL/min — AB (ref >60)
GLUCOSE: 248 mg/dL — AB (ref 65–99)
Potassium: 4.2 mmol/L (ref 3.5–5.1)
SODIUM: 136 mmol/L (ref 135–145)

## 2018-04-10 SURGERY — REMOVAL, HARDWARE
Anesthesia: General | Site: Knee | Laterality: Right

## 2018-04-10 MED ORDER — SODIUM CHLORIDE 0.9 % IV SOLN
INTRAVENOUS | Status: DC
  Administered 2018-04-10: 15:00:00 via INTRAVENOUS

## 2018-04-10 MED ORDER — ONDANSETRON HCL 4 MG/2ML IJ SOLN: 4.0000 mg | Freq: Once | INTRAMUSCULAR | Status: DC | PRN

## 2018-04-10 MED ORDER — LIDOCAINE 2% (20 MG/ML) 5 ML SYRINGE
INTRAMUSCULAR | Status: DC | PRN
  Administered 2018-04-10: 80 mg via INTRAVENOUS

## 2018-04-10 MED ORDER — ENOXAPARIN SODIUM 30 MG/0.3ML ~~LOC~~ SOLN
30.0000 mg | SUBCUTANEOUS | Status: DC
  Administered 2018-04-11 – 2018-04-13 (×3): 30 mg via SUBCUTANEOUS
  Filled 2018-04-10 (×4): qty 0.3

## 2018-04-10 MED ORDER — ROCURONIUM BROMIDE 10 MG/ML (PF) SYRINGE
PREFILLED_SYRINGE | INTRAVENOUS | Status: AC
  Filled 2018-04-10: qty 5

## 2018-04-10 MED ORDER — FENTANYL CITRATE (PF) 250 MCG/5ML IJ SOLN
INTRAMUSCULAR | Status: AC
  Filled 2018-04-10: qty 5

## 2018-04-10 MED ORDER — PHENYLEPHRINE HCL 10 MG/ML IJ SOLN
INTRAVENOUS | Status: DC | PRN
  Administered 2018-04-10: 50 ug/min via INTRAVENOUS

## 2018-04-10 MED ORDER — DEXAMETHASONE SODIUM PHOSPHATE 10 MG/ML IJ SOLN
INTRAMUSCULAR | Status: AC
  Filled 2018-04-10: qty 1

## 2018-04-10 MED ORDER — SUCCINYLCHOLINE CHLORIDE 200 MG/10ML IV SOSY
PREFILLED_SYRINGE | INTRAVENOUS | Status: DC | PRN
  Administered 2018-04-10: 100 mg via INTRAVENOUS

## 2018-04-10 MED ORDER — MENTHOL 3 MG MT LOZG: 1.0000 | LOZENGE | OROMUCOSAL | Status: DC | PRN

## 2018-04-10 MED ORDER — 0.9 % SODIUM CHLORIDE (POUR BTL) OPTIME
TOPICAL | Status: DC | PRN
  Administered 2018-04-10: 1000 mL

## 2018-04-10 MED ORDER — DOCUSATE SODIUM 100 MG PO CAPS
100.0000 mg | ORAL_CAPSULE | Freq: Two times a day (BID) | ORAL | Status: DC
  Administered 2018-04-10 – 2018-04-14 (×8): 100 mg via ORAL
  Filled 2018-04-10 (×8): qty 1

## 2018-04-10 MED ORDER — DEXAMETHASONE SODIUM PHOSPHATE 4 MG/ML IJ SOLN
INTRAMUSCULAR | Status: DC | PRN
  Administered 2018-04-10: 4 mg via INTRAVENOUS

## 2018-04-10 MED ORDER — PHENYLEPHRINE HCL 10 MG/ML IJ SOLN
INTRAMUSCULAR | Status: AC
  Filled 2018-04-10: qty 1

## 2018-04-10 MED ORDER — PHENYLEPHRINE 40 MCG/ML (10ML) SYRINGE FOR IV PUSH (FOR BLOOD PRESSURE SUPPORT)
PREFILLED_SYRINGE | INTRAVENOUS | Status: AC
  Filled 2018-04-10: qty 10

## 2018-04-10 MED ORDER — PROPOFOL 10 MG/ML IV BOLUS
INTRAVENOUS | Status: AC
  Filled 2018-04-10: qty 20

## 2018-04-10 MED ORDER — MIDAZOLAM HCL 2 MG/2ML IJ SOLN
INTRAMUSCULAR | Status: AC
  Filled 2018-04-10: qty 2

## 2018-04-10 MED ORDER — FERROUS SULFATE 325 (65 FE) MG PO TABS
325.0000 mg | ORAL_TABLET | Freq: Three times a day (TID) | ORAL | Status: DC
  Administered 2018-04-11 – 2018-04-14 (×9): 325 mg via ORAL
  Filled 2018-04-10 (×10): qty 1

## 2018-04-10 MED ORDER — ONDANSETRON HCL 4 MG/2ML IJ SOLN
INTRAMUSCULAR | Status: AC
  Filled 2018-04-10: qty 2

## 2018-04-10 MED ORDER — PHENYLEPHRINE 40 MCG/ML (10ML) SYRINGE FOR IV PUSH (FOR BLOOD PRESSURE SUPPORT)
PREFILLED_SYRINGE | INTRAVENOUS | Status: DC | PRN
  Administered 2018-04-10 (×3): 80 ug via INTRAVENOUS
  Administered 2018-04-10: 120 ug via INTRAVENOUS

## 2018-04-10 MED ORDER — FENTANYL CITRATE (PF) 100 MCG/2ML IJ SOLN
INTRAMUSCULAR | Status: DC | PRN
  Administered 2018-04-10 (×4): 50 ug via INTRAVENOUS

## 2018-04-10 MED ORDER — METOCLOPRAMIDE HCL 5 MG/ML IJ SOLN: 5.0000 mg | Freq: Three times a day (TID) | INTRAMUSCULAR | Status: DC | PRN

## 2018-04-10 MED ORDER — LIDOCAINE 2% (20 MG/ML) 5 ML SYRINGE
INTRAMUSCULAR | Status: AC
  Filled 2018-04-10: qty 5

## 2018-04-10 MED ORDER — ROCURONIUM BROMIDE 10 MG/ML (PF) SYRINGE
PREFILLED_SYRINGE | INTRAVENOUS | Status: DC | PRN
  Administered 2018-04-10: 10 mg via INTRAVENOUS
  Administered 2018-04-10: 50 mg via INTRAVENOUS

## 2018-04-10 MED ORDER — MEPERIDINE HCL 50 MG/ML IJ SOLN
INTRAMUSCULAR | Status: AC
  Filled 2018-04-10: qty 1

## 2018-04-10 MED ORDER — PROPOFOL 10 MG/ML IV BOLUS
INTRAVENOUS | Status: DC | PRN
  Administered 2018-04-10: 150 mg via INTRAVENOUS

## 2018-04-10 MED ORDER — SUGAMMADEX SODIUM 200 MG/2ML IV SOLN
INTRAVENOUS | Status: AC
  Filled 2018-04-10: qty 2

## 2018-04-10 MED ORDER — FENTANYL CITRATE (PF) 100 MCG/2ML IJ SOLN
25.0000 ug | INTRAMUSCULAR | Status: DC | PRN
  Administered 2018-04-10: 50 ug via INTRAVENOUS

## 2018-04-10 MED ORDER — PHENOL 1.4 % MT LIQD: 1.0000 | OROMUCOSAL | Status: DC | PRN

## 2018-04-10 MED ORDER — SUGAMMADEX SODIUM 200 MG/2ML IV SOLN
INTRAVENOUS | Status: DC | PRN
  Administered 2018-04-10: 140 mg via INTRAVENOUS

## 2018-04-10 MED ORDER — MEPERIDINE HCL 50 MG/ML IJ SOLN
6.2500 mg | INTRAMUSCULAR | Status: DC | PRN
  Administered 2018-04-10 (×2): 6.25 mg via INTRAVENOUS

## 2018-04-10 MED ORDER — METOCLOPRAMIDE HCL 5 MG PO TABS: 5.0000 mg | ORAL_TABLET | Freq: Three times a day (TID) | ORAL | Status: DC | PRN

## 2018-04-10 MED ORDER — FENTANYL CITRATE (PF) 100 MCG/2ML IJ SOLN
INTRAMUSCULAR | Status: AC
  Filled 2018-04-10: qty 2

## 2018-04-10 MED ORDER — LACTATED RINGERS IV SOLN
INTRAVENOUS | Status: DC | PRN
  Administered 2018-04-10 (×2): via INTRAVENOUS

## 2018-04-10 MED ORDER — ALBUTEROL SULFATE (2.5 MG/3ML) 0.083% IN NEBU
INHALATION_SOLUTION | RESPIRATORY_TRACT | Status: AC
  Filled 2018-04-10: qty 3

## 2018-04-10 SURGICAL SUPPLY — 70 items
ADH SKN CLS APL DERMABOND .7 (GAUZE/BANDAGES/DRESSINGS) ×4
BAG SPEC THK2 15X12 ZIP CLS (MISCELLANEOUS) ×2
BAG ZIPLOCK 12X15 (MISCELLANEOUS) ×4 IMPLANT
BANDAGE ACE 6X5 VEL STRL LF (GAUZE/BANDAGES/DRESSINGS) ×4 IMPLANT
BANDAGE ESMARK 6X9 LF (GAUZE/BANDAGES/DRESSINGS) ×2 IMPLANT
BIT DRILL CANN LG 4.3MM (BIT) IMPLANT
BLADE SAW SGTL 11.0X1.19X90.0M (BLADE) IMPLANT
BNDG CMPR 9X6 STRL LF SNTH (GAUZE/BANDAGES/DRESSINGS) ×2
BNDG ESMARK 6X9 LF (GAUZE/BANDAGES/DRESSINGS) ×4
BNDG GAUZE ELAST 4 BULKY (GAUZE/BANDAGES/DRESSINGS) ×4 IMPLANT
CLOSURE WOUND 1/2 X4 (GAUZE/BANDAGES/DRESSINGS)
COVER MAYO STAND STRL (DRAPES) IMPLANT
COVER PERINEAL POST (MISCELLANEOUS) ×4 IMPLANT
COVER SURGICAL LIGHT HANDLE (MISCELLANEOUS) ×4 IMPLANT
CUFF TOURN SGL QUICK 34 (TOURNIQUET CUFF) ×4
CUFF TRNQT CYL 34X4X40X1 (TOURNIQUET CUFF) ×2 IMPLANT
DERMABOND ADVANCED (GAUZE/BANDAGES/DRESSINGS) ×4
DERMABOND ADVANCED .7 DNX12 (GAUZE/BANDAGES/DRESSINGS) IMPLANT
DRAPE C-ARM 42X120 X-RAY (DRAPES) IMPLANT
DRAPE EXTREMITY T 121X128X90 (DRAPE) IMPLANT
DRAPE OEC MINIVIEW 54X84 (DRAPES) IMPLANT
DRAPE POUCH INSTRU U-SHP 10X18 (DRAPES) ×4 IMPLANT
DRAPE STERI IOBAN 125X83 (DRAPES) ×4 IMPLANT
DRAPE U-SHAPE 47X51 STRL (DRAPES) ×4 IMPLANT
DRESSING AQUACEL AG SP 3.5X6 (GAUZE/BANDAGES/DRESSINGS) IMPLANT
DRILL BIT CANN LG 4.3MM (BIT) ×4
DRSG AQUACEL AG ADV 3.5X 4 (GAUZE/BANDAGES/DRESSINGS) ×2 IMPLANT
DRSG AQUACEL AG ADV 3.5X 6 (GAUZE/BANDAGES/DRESSINGS) ×2 IMPLANT
DRSG AQUACEL AG ADV 3.5X10 (GAUZE/BANDAGES/DRESSINGS) ×2 IMPLANT
DRSG AQUACEL AG SP 3.5X6 (GAUZE/BANDAGES/DRESSINGS) ×4
DRSG EMULSION OIL 3X16 NADH (GAUZE/BANDAGES/DRESSINGS) ×4 IMPLANT
DRSG PAD ABDOMINAL 8X10 ST (GAUZE/BANDAGES/DRESSINGS) ×4 IMPLANT
DURAPREP 26ML APPLICATOR (WOUND CARE) ×4 IMPLANT
ELECT REM PT RETURN 15FT ADLT (MISCELLANEOUS) ×4 IMPLANT
GAUZE SPONGE 4X4 12PLY STRL (GAUZE/BANDAGES/DRESSINGS) ×4 IMPLANT
GLOVE BIOGEL M 7.0 STRL (GLOVE) IMPLANT
GLOVE BIOGEL PI IND STRL 7.5 (GLOVE) ×2 IMPLANT
GLOVE BIOGEL PI IND STRL 8.5 (GLOVE) ×2 IMPLANT
GLOVE BIOGEL PI INDICATOR 7.5 (GLOVE) ×2
GLOVE BIOGEL PI INDICATOR 8.5 (GLOVE) ×2
GLOVE ECLIPSE 8.0 STRL XLNG CF (GLOVE) IMPLANT
GLOVE ORTHO TXT STRL SZ7.5 (GLOVE) ×4 IMPLANT
GLOVE SURG ORTHO 8.0 STRL STRW (GLOVE) ×2 IMPLANT
GOWN STRL REUS W/TWL LRG LVL3 (GOWN DISPOSABLE) ×4 IMPLANT
GOWN STRL REUS W/TWL XL LVL3 (GOWN DISPOSABLE) ×8 IMPLANT
GUIDEPIN 3.2X17.5 THRD DISP (PIN) ×2 IMPLANT
GUIDEWIRE BALL NOSE 80CM (WIRE) ×2 IMPLANT
KIT BASIN OR (CUSTOM PROCEDURE TRAY) ×4 IMPLANT
MANIFOLD NEPTUNE II (INSTRUMENTS) ×4 IMPLANT
NAIL HIP FRACT 130D 11X180 (Screw) ×2 IMPLANT
NS IRRIG 1000ML POUR BTL (IV SOLUTION) ×4 IMPLANT
PACK GENERAL/GYN (CUSTOM PROCEDURE TRAY) ×4 IMPLANT
PACK TOTAL JOINT (CUSTOM PROCEDURE TRAY) ×4 IMPLANT
PADDING CAST COTTON 6X4 STRL (CAST SUPPLIES) ×4 IMPLANT
POSITIONER SURGICAL ARM (MISCELLANEOUS) ×4 IMPLANT
SCREW BONE CORTICAL 5.0X3 (Screw) ×2 IMPLANT
SCREW LAG HIP NAIL 10.5X95 (Screw) ×2 IMPLANT
STAPLER VISISTAT 35W (STAPLE) IMPLANT
STRIP CLOSURE SKIN 1/2X4 (GAUZE/BANDAGES/DRESSINGS) ×4 IMPLANT
SUCTION FRAZIER HANDLE 12FR (TUBING) ×2
SUCTION TUBE FRAZIER 12FR DISP (TUBING) IMPLANT
SUT MNCRL AB 4-0 PS2 18 (SUTURE) ×4 IMPLANT
SUT VIC AB 1 CT1 27 (SUTURE) ×4
SUT VIC AB 1 CT1 27XBRD ANTBC (SUTURE) ×2 IMPLANT
SUT VIC AB 1 CT1 36 (SUTURE) ×8 IMPLANT
SUT VIC AB 2-0 CT1 27 (SUTURE) ×16
SUT VIC AB 2-0 CT1 27XBRD (SUTURE) ×2 IMPLANT
SUT VIC AB 2-0 CT1 TAPERPNT 27 (SUTURE) ×2 IMPLANT
TOWEL OR 17X26 10 PK STRL BLUE (TOWEL DISPOSABLE) ×8 IMPLANT
WATER STERILE IRR 1000ML POUR (IV SOLUTION) ×4 IMPLANT

## 2018-04-10 NOTE — Anesthesia Procedure Notes (Signed)
Procedure Name: Intubation Date/Time: 04/10/2018 4:26 PM Performed by: Claudia Desanctis, CRNA Pre-anesthesia Checklist: Patient identified, Emergency Drugs available, Suction available and Patient being monitored Patient Re-evaluated:Patient Re-evaluated prior to induction Oxygen Delivery Method: Circle system utilized Preoxygenation: Pre-oxygenation with 100% oxygen Induction Type: IV induction Ventilation: Mask ventilation without difficulty Laryngoscope Size: 2 and Miller Grade View: Grade I Tube type: Oral Tube size: 7.0 mm Number of attempts: 1 Airway Equipment and Method: Stylet Placement Confirmation: ETT inserted through vocal cords under direct vision,  positive ETCO2 and breath sounds checked- equal and bilateral Secured at: 22 cm Tube secured with: Tape Dental Injury: Teeth and Oropharynx as per pre-operative assessment

## 2018-04-10 NOTE — Progress Notes (Signed)
Patient ID: Heather [name], female   DOB: April 13, 1951, 67 y.o.   MRN: [id number]  Relatively comfortable this am Ready for surgery today  Cr - 2.8 Hgb 10  RLE NVI  Right IT proximal femur fracture with retained femoral nail from prior ORIF distal femur peri-prosthetic fracture  To OR today for removal of prior nail and ORIF right proximal femur fracture NPO Consent ordered  Type and screen

## 2018-04-10 NOTE — Clinical Social Work Note (Signed)
Clinical Social Work Assessment  Patient Details  Name: Heather [name] MRN: [id number] Date of Birth: 28-Feb-1951  Date of referral:  04/09/18               Reason for consult:  Facility Placement                Permission sought to share information with:  Family Supports, Chartered certified accountant granted to share information::  Yes, Verbal Permission Granted  Name::        Agency::  SNF  Relationship::   Spouse  Contact Information:     Housing/Transportation Living arrangements for the past 2 months:  Single Family Home Source of Information:  Patient Patient Interpreter Needed:  None Criminal Activity/Legal Involvement Pertinent to Current Situation/Hospitalization:  No - Comment as needed Significant Relationships:  Spouse Lives with:  Spouse Do you feel safe going back to the place where you live?  Yes Need for family participation in patient care:  Yes (Comment)  Care giving concerns:   Patient has medical history significant ofCKD stage 3 due to DM2, HTN, chronic N/V.Previous R femur fx s/p repair. Patient presents to the ED at [facility] after mechanical fall. Pt dx with Intertrochanteric fracture right femur on evaluation.   Social Worker assessment / plan: CSW met with patient at bedside, explain role and reason for visit to assist with discharge to SNF.  Patient informed CSW she lives at home with her spouse. She reports prior to fall she was fairly independent. Patient reports completing her own ADL's.  Patient reports she has been to SNF in the past and is familiar with SNF process. Patient prefers to go back to [facility] if a bed is available at discharge. Patient understands if bed is unavailable she will need to choose another facility.   FL2/ PASSRR will need to be completed.   Plan: SNF  Employment status:  Retired Nurse, adult PT Recommendations:  [place] /  Referral to community resources:  [place]  Patient/Family's Response to care:  Agreeable and Responding well to care.   Patient/Family's Understanding of and Emotional Response to Diagnosis, Current Treatment, and Prognosis: Patient has a good understanding of her diagnosis and care. Patient goal is to complete rehab and return home with spouse.   Emotional Assessment Appearance:  Appears stated age Attitude/Demeanor/Rapport:    Affect (typically observed):  Accepting, Calm Orientation:  Oriented to Self, Oriented to Place, Oriented to  Time, Oriented to Situation Alcohol / Substance use:  Not Applicable Psych involvement (Current and /or in the community):     Discharge Needs  Concerns to be addressed:  Discharge Planning Concerns Readmission within the last 30 days:  No Current discharge risk:  Dependent with Mobility, Physical Impairment Barriers to Discharge:  Continued Medical Work up   [employer], LCSW 04/10/2018, 9:13 AM

## 2018-04-10 NOTE — Brief Op Note (Signed)
04/07/2018 - 04/10/2018  5:47 PM  PATIENT:  Heather [name]  67 y.o. female  PRE-OPERATIVE DIAGNOSIS:  1.  retained hardware right distal femur 2.  Right inter-trochanteric femur fracture  POST-OPERATIVE DIAGNOSIS:  1.  retained hardware right distal femur 2.  Right inter-trochanteric femur fracture  PROCEDURE:  Procedure(s): HARDWARE REMOVAL RIGHT DISTAL FEMUR (Right) INTRAMEDULLARY (IM) NAIL FEMORAL (Right)  SURGEON:  Surgeon(s) and Role:    Paralee Cancel, MD - Primary  PHYSICIAN ASSISTANT: Danae Orleans, PA-C (for hardware removal portion)  ANESTHESIA:   general  EBL:  <150 cc  BLOOD ADMINISTERED:none  DRAINS: none   LOCAL MEDICATIONS USED:  NONE  SPECIMEN:  No Specimen  DISPOSITION OF SPECIMEN:  N/A  COUNTS:  YES  TOURNIQUET:  * Missing tourniquet times found for documented tourniquets in log: [id number] *  DICTATION: .Other Dictation: Dictation Number [id number]  PLAN OF CARE: Admit to inpatient   PATIENT DISPOSITION:  PACU - hemodynamically stable.   Delay start of Pharmacological VTE agent (>24hrs) due to surgical blood loss or risk of bleeding: no

## 2018-04-10 NOTE — Progress Notes (Signed)
PROGRESS NOTE    Heather [name]  DUK:[id number] DOB: Nov 03, 1950 DOA: 04/07/2018 PCP: [name], MD   Brief Narrative: Heather [name] is a 67 y.o. female with a history of CKD stage 3, diabetes mellitus, hypertension, history of femur fracture. She presented secondary to hip pain from a fall and found to have a right hip fracture. Plan for surgery.   Assessment & Plan:   Principal Problem:   Closed right hip fracture (HCC) Active Problems:   Chronic Nausea and Vomiting - ? gastroapresis   Essential hypertension   Diabetes mellitus with chronic kidney disease ([place])   Right hip fracture Plan for surgery today -orthopedic surgery recommendations  Essential hypertension Slightly hypertensive -Continue metoprolol BID  Acute kidney injury on CKD stage  Anemia Chronic.  Baseline of around 9. Likely related to chronic disease from CKD. Stable.  Acute kidney injury on CKD stage III Baseline creatinine of 1.3-1.5. 2.8 on admission, down to 1.95 with IV fluids -Repeat BMP in AM  Diabetes mellitus, insulin dependent -Continue Levemir and SSI  ?UTI Patient without current urinalysis. Not complaining of symptoms. -Discontinue ceftriaxone   DVT prophylaxis: SCDs Code Status:   Code Status: DNR Family Communication: None at bedside Disposition Plan: Discharge pending orthopedic surgery recommendations/management   Consultants:   Orthopedic surgery  Procedures:   None  Antimicrobials:  Ceftriaxone  Cefazolin    Subjective: Pain, otherwise, no concern  Objective: Vitals:   04/09/18 2126 04/09/18 2300 04/10/18 0604 04/10/18 1318  BP: (!) 126/43 (!) 140/58 (!) 146/56 (!) 142/65  Pulse: 97 92 82 87  Resp: 16  17 14   Temp: 98.5 F (36.9 C)  98.6 F (37 C) 98.4 F (36.9 C)  TempSrc: Oral  Oral Oral  SpO2: 93%  98% 98%  Weight:      Height:        Intake/Output Summary (Last 24 hours) at 04/10/2018 1353 Last data filed at 04/10/2018 1219 Gross  per 24 hour  Intake 240 ml  Output 1150 ml  Net -910 ml   Filed Weights   04/07/18 2343  Weight: 69.9 kg (154 lb)    Examination:  General exam: Appears calm and comfortable Respiratory system: Clear to auscultation. Respiratory effort normal. Cardiovascular system: S1 & S2 heard, RRR. No murmurs. Gastrointestinal system: Abdomen is nondistended, soft and nontender. Normal bowel sounds heard. Central nervous system: Alert and oriented. No focal neurological deficits. Extremities: No edema. No calf tenderness Skin: No cyanosis. No rashes Psychiatry: Judgement and insight appear normal. Mood & affect appropriate.     Data Reviewed: I have personally reviewed following labs and imaging studies  CBC: Recent Labs  Lab 04/08/18 0056 04/10/18 0904  WBC 9.5 7.6  HGB 10.1* 9.0*  HCT 31.0* 27.4*  MCV 92.8 91.9  PLT 197 376   Basic Metabolic Panel: Recent Labs  Lab 04/08/18 0056 04/10/18 0904  NA 138 136  K 3.8 4.2  CL 103 104  CO2 22 22  GLUCOSE 332* 248*  BUN 46* 40*  CREATININE 2.80* 1.95*  CALCIUM 9.0 9.4   GFR: Estimated Creatinine Clearance: 27 mL/min (A) (by C-G formula based on SCr of 1.95 mg/dL (H)). Liver Function Tests: No results for input(s): AST, ALT, ALKPHOS, BILITOT, PROT, ALBUMIN in the last 168 hours. No results for input(s): LIPASE, AMYLASE in the last 168 hours. No results for input(s): AMMONIA in the last 168 hours. Coagulation Profile: No results for input(s): INR, PROTIME in the last 168 hours. Cardiac Enzymes: No  results for input(s): CKTOTAL, CKMB, CKMBINDEX, TROPONINI in the last 168 hours. BNP (last 3 results) No results for input(s): PROBNP in the last 8760 hours. HbA1C: No results for input(s): HGBA1C in the last 72 hours. CBG: Recent Labs  Lab 04/09/18 1154 04/09/18 1710 04/09/18 2338 04/10/18 0808 04/10/18 1233  GLUCAP 259* 243* 327* 245* 195*   Lipid Profile: No results for input(s): CHOL, HDL, LDLCALC, TRIG, CHOLHDL,  LDLDIRECT in the last 72 hours. Thyroid Function Tests: No results for input(s): TSH, T4TOTAL, FREET4, T3FREE, THYROIDAB in the last 72 hours. Anemia Panel: No results for input(s): VITAMINB12, FOLATE, FERRITIN, TIBC, IRON, RETICCTPCT in the last 72 hours. Sepsis Labs: No results for input(s): PROCALCITON, LATICACIDVEN in the last 168 hours.  Recent Results (from the past 240 hour(s))  Surgical PCR screen     Status: None   Collection Time: 04/08/18  6:28 AM  Result Value Ref Range Status   MRSA, PCR NEGATIVE NEGATIVE Final   Staphylococcus aureus NEGATIVE NEGATIVE Final    Comment: (NOTE) The Xpert SA Assay (FDA approved for NASAL specimens in patients 86 years of age and older), is one component of a comprehensive surveillance program. It is not intended to diagnose infection nor to guide or monitor treatment. Performed at [facility], [place] [address] [place], [place] [address]          Radiology Studies: No results found.      Scheduled Meds: . allopurinol  50 mg Oral Daily  . cholecalciferol  2,000 Units Oral Daily  . citalopram  20 mg Oral Daily  . [START ON 04/11/2018] cyanocobalamin  1,000 mcg Intramuscular Q30 days  . insulin aspart  0-9 Units Subcutaneous TID WC & HS  . insulin detemir  40 Units Subcutaneous QHS  . metoprolol tartrate  100 mg Oral BID  . pantoprazole  80 mg Oral Q1200   Continuous Infusions: .  ceFAZolin (ANCEF) IV    . cefTRIAXone (ROCEPHIN)  IV Stopped (04/09/18 2149)     LOS: 3 days     Cordelia Poche, MD [facility] 04/10/2018, 1:53 PM Pager: [phone]  If 7PM-7AM, please contact night-coverage [email or web address] 04/10/2018, 1:53 PM

## 2018-04-10 NOTE — Anesthesia Preprocedure Evaluation (Addendum)
Anesthesia Evaluation  Patient identified by MRN, date of birth, ID band Patient awake    Reviewed: Allergy & Precautions, NPO status , Patient's Chart, lab work & pertinent test results, reviewed documented beta blocker date and time   Airway Mallampati: II  TM Distance: >3 FB Neck ROM: Full    Dental  (+) Teeth Intact, Dental Advisory Given   Pulmonary neg pulmonary ROS, shortness of breath,    Pulmonary exam normal breath sounds clear to auscultation       Cardiovascular hypertension, Pt. on home beta blockers Normal cardiovascular exam Rhythm:Regular Rate:Normal     Neuro/Psych  Headaches, PSYCHIATRIC DISORDERS Depression    GI/Hepatic Neg liver ROS, hiatal hernia, GERD  Medicated,  Endo/Other  diabetes, Type 2, Insulin Dependent  Renal/GU Renal InsufficiencyRenal disease     Musculoskeletal  (+) Arthritis , Osteoarthritis,  -retained hardware right distal femur -fractured right femur   Abdominal   Peds  Hematology  (+) Blood dyscrasia, anemia ,   Anesthesia Other Findings Day of surgery medications reviewed with the patient.  Reproductive/Obstetrics                             Anesthesia Physical Anesthesia Plan  ASA: III  Anesthesia Plan: General   Post-op Pain Management:    Induction: Intravenous and Rapid sequence  PONV Risk Score and Plan: 3 and Midazolam, Dexamethasone and Ondansetron  Airway Management Planned: Oral ETT  Additional Equipment:   Intra-op Plan:   Post-operative Plan: Extubation in OR  Informed Consent: I have reviewed the patients History and Physical, chart, labs and discussed the procedure including the risks, benefits and alternatives for the proposed anesthesia with the patient or authorized representative who has indicated his/her understanding and acceptance.   Dental advisory given  Plan Discussed with: CRNA  Anesthesia Plan Comments:  (Discussed DNR with patient. Patient wishes full resuscitation in perioperative period.)       Anesthesia Quick Evaluation

## 2018-04-10 NOTE — Anesthesia Postprocedure Evaluation (Signed)
Anesthesia Post Note  Patient: Heather [name]  Procedure(s) Performed: HARDWARE REMOVAL RIGHT DISTAL FEMUR (Right Knee) INTRAMEDULLARY (IM) NAIL FEMORAL (Right Hip)     Patient location during evaluation: PACU Anesthesia Type: General Level of consciousness: awake and alert and awake (Neuro at baseline) Pain management: pain level controlled Vital Signs Assessment: post-procedure vital signs reviewed and stable Respiratory status: spontaneous breathing, nonlabored ventilation, respiratory function stable and patient connected to nasal cannula oxygen Cardiovascular status: blood pressure returned to baseline and stable Postop Assessment: no apparent nausea or vomiting Anesthetic complications: no    Last Vitals:  Vitals:   04/10/18 2023 04/10/18 2025  BP: (!) 152/54   Pulse: 94   Resp: 16   Temp:  36.7 C  SpO2: 97%     Last Pain:  Vitals:   04/10/18 2025  TempSrc: Axillary  PainSc:                  Catalina Gravel

## 2018-04-10 NOTE — Transfer of Care (Signed)
Immediate Anesthesia Transfer of Care Note  Patient: Heather [name]  Procedure(s) Performed: HARDWARE REMOVAL RIGHT DISTAL FEMUR (Right Knee) INTRAMEDULLARY (IM) NAIL FEMORAL (Right Hip)  Patient Location: PACU  Anesthesia Type:General  Level of Consciousness: drowsy and patient cooperative  Airway & Oxygen Therapy: Patient Spontanous Breathing and Patient connected to face mask oxygen  Post-op Assessment: Report given to RN and Post -op Vital signs reviewed and stable  Post vital signs: Reviewed and stable  Last Vitals:  Vitals Value Taken Time  BP 155/127 04/10/2018  7:03 PM  Temp    Pulse 92 04/10/2018  7:05 PM  Resp 24 04/10/2018  7:05 PM  SpO2 100 % 04/10/2018  7:05 PM  Vitals shown include unvalidated device data.  Last Pain:  Vitals:   04/10/18 1322  TempSrc:   PainSc: Asleep      Patients Stated Pain Goal: 2 (38/18/29 9371)  Complications: No apparent anesthesia complications

## 2018-04-11 ENCOUNTER — Encounter (HOSPITAL_COMMUNITY): Payer: Self-pay | Admitting: Orthopedic Surgery

## 2018-04-11 DIAGNOSIS — N189 Chronic kidney disease, unspecified: Secondary | ICD-10-CM

## 2018-04-11 DIAGNOSIS — N179 Acute kidney failure, unspecified: Secondary | ICD-10-CM

## 2018-04-11 LAB — BASIC METABOLIC PANEL
ANION GAP: 9 (ref 5–15)
BUN: 34 mg/dL — ABNORMAL HIGH (ref 6–20)
CALCIUM: 9 mg/dL (ref 8.9–10.3)
CO2: 20 mmol/L — ABNORMAL LOW (ref 22–32)
Chloride: 105 mmol/L (ref 101–111)
Creatinine, Ser: 1.68 mg/dL — ABNORMAL HIGH (ref 0.44–1.00)
GFR calc non Af Amer: 31 mL/min — ABNORMAL LOW (ref >60)
GFR, EST AFRICAN AMERICAN: 36 mL/min — AB (ref >60)
Glucose, Bld: 411 mg/dL — ABNORMAL HIGH (ref 65–99)
POTASSIUM: 4.7 mmol/L (ref 3.5–5.1)
SODIUM: 134 mmol/L — AB (ref 135–145)

## 2018-04-11 LAB — GLUCOSE, CAPILLARY
GLUCOSE-CAPILLARY: 399 mg/dL — AB (ref 65–99)
Glucose-Capillary: 391 mg/dL — ABNORMAL HIGH (ref 65–99)
Glucose-Capillary: 322 mg/dL — ABNORMAL HIGH (ref 65–99)
Glucose-Capillary: 346 mg/dL — ABNORMAL HIGH (ref 65–99)

## 2018-04-11 LAB — CBC
HCT: 23.9 % — ABNORMAL LOW (ref 36.0–46.0)
Hemoglobin: 7.6 g/dL — ABNORMAL LOW (ref 12.0–15.0)
MCH: 28.7 pg (ref 26.0–34.0)
MCHC: 31.8 g/dL (ref 30.0–36.0)
MCV: 90.2 fL (ref 78.0–100.0)
PLATELETS: 176 10*3/uL (ref 150–400)
RBC: 2.65 MIL/uL — AB (ref 3.87–5.11)
RDW: 14 % (ref 11.5–15.5)
WBC: 6.6 10*3/uL (ref 4.0–10.5)

## 2018-04-11 MED ORDER — INSULIN ASPART 100 UNIT/ML ~~LOC~~ SOLN
0.0000 [IU] | Freq: Three times a day (TID) | SUBCUTANEOUS | Status: DC
  Administered 2018-04-11: 11 [IU] via SUBCUTANEOUS
  Administered 2018-04-12 (×2): 8 [IU] via SUBCUTANEOUS
  Administered 2018-04-12: 5 [IU] via SUBCUTANEOUS
  Administered 2018-04-13: 3 [IU] via SUBCUTANEOUS
  Administered 2018-04-13: 5 [IU] via SUBCUTANEOUS
  Administered 2018-04-13: 3 [IU] via SUBCUTANEOUS
  Administered 2018-04-14: 8 [IU] via SUBCUTANEOUS

## 2018-04-11 MED ORDER — INSULIN DETEMIR 100 UNIT/ML ~~LOC~~ SOLN
60.0000 [IU] | Freq: Every day | SUBCUTANEOUS | Status: DC
  Administered 2018-04-11: 60 [IU] via SUBCUTANEOUS
  Filled 2018-04-11: qty 0.6

## 2018-04-11 NOTE — NC FL2 (Signed)
[place] LEVEL OF CARE SCREENING TOOL     IDENTIFICATION  Patient Name: Heather [name] Birthdate: 04/23/1951 Sex: female Admission Date (Current Location): 04/07/2018  [facility] and [place] Number:  Herbalist and Address:  [facility],  [place], [place]      Provider Number: [id number]  Attending Physician Name and Address:  Mariel Aloe, MD  Relative Name and Phone Number:       Current Level of Care: Hospital Recommended Level of Care: [place] Prior Approval Number:    Date Approved/Denied:   PASRR Number: [id number]  Discharge Plan:      Current Diagnoses: Patient Active Problem List   Diagnosis Date Noted  . Closed right hip fracture ([place]) 04/08/2018  . Salmonella   . Elevated lipase   . Gastroenteritis   . Protein-calorie malnutrition, severe 03/05/2018  . AKI (acute kidney injury) ([place]) 03/04/2018  . Neuropathy 03/02/2015  . Pain in the chest   . Chest pain 12/22/2014  . Essential hypertension 12/22/2014  . Diabetes mellitus with chronic kidney disease ([place]) 12/22/2014  . Hiatal hernia 12/22/2014  . GERD (gastroesophageal reflux disease) 12/22/2014  . Gout 12/22/2014  . Mixed hyperlipidemia 12/22/2014  . Depression 12/22/2014  . Chronic kidney disease, stage III (moderate) ([place]) 08/25/2014  . Vitamin B12 deficiency 07/08/2011  . Personal history of failed moderate sedation 04/16/2011  . Chronic Nausea and Vomiting - ? gastroapresis 04/16/2011  . Early satiety 04/16/2011  . Irritable bowel syndrome 04/16/2011  . Benign paroxysmal positional vertigo 11/20/2010  . TRICUSPID REGURGITATION, MODERATE 11/20/2010  . PULMONARY HYPERTENSION, MILD 11/20/2010  . LEG EDEMA, BILATERAL 11/20/2010  . DYSPNEA 11/20/2010  . NONSPECIFIC ABNORMAL ELECTROCARDIOGRAM 11/20/2010  . CHEST WALL PAIN, HX OF 11/20/2010    Orientation RESPIRATION BLADDER Height & Weight     Self, Time,  Situation, Place  Normal Continent Weight: 154 lb (69.9 kg) Height:  5\' 3"  (160 cm)  BEHAVIORAL SYMPTOMS/MOOD NEUROLOGICAL BOWEL NUTRITION STATUS      Continent Diet(See discharge summary )  AMBULATORY STATUS COMMUNICATION OF NEEDS Skin   Extensive Assist Verbally Surgical wounds                       Personal Care Assistance Level of Assistance  Bathing, Feeding, Dressing Bathing Assistance: Limited assistance Feeding assistance: Independent Dressing Assistance: Limited assistance     Functional Limitations Info  Sight, Hearing, Speech Sight Info: Adequate Hearing Info: Adequate Speech Info: Adequate    SPECIAL CARE FACTORS FREQUENCY  PT (By licensed PT), OT (By licensed OT)                    Contractures Contractures Info: Not present    Additional Factors Info  Code Status, Allergies, Psychotropic, Insulin Sliding Scale Code Status Info: DNR Allergies Info: Allergies: Duloxetine Hcl, Fluocinolone, Acetaminophen, Amlodipine Besylate, Ciprofloxacin, Hydrocodone-acetaminophen, Metformin, Nsaids, Quinolones, Sulfamethoxazole, Sulfonamide Derivatives, Valsartan, Cozaar Losartan, Lisinopril Psychotropic Info: Celexa  Insulin Sliding Scale Info: Med list       Current Medications (04/11/2018):  This is the current hospital active medication list Current Facility-Administered Medications  Medication Dose Route Frequency Provider Last Rate Last Dose  . allopurinol (ZYLOPRIM) tablet 50 mg  50 mg Oral Daily Danae Orleans, PA-C   50 mg at 04/11/18 6160  . cefTRIAXone (ROCEPHIN) 1 g in sodium chloride 0.9 % 100 mL IVPB  1 g Intravenous QHS Danae Orleans, PA-C   Stopped  at 04/10/18 2327  . cholecalciferol (VITAMIN D) tablet 2,000 Units  2,000 Units Oral Daily Danae Orleans, PA-C   2,000 Units at 04/11/18 0355  . citalopram (CELEXA) tablet 20 mg  20 mg Oral Daily Danae Orleans, PA-C   20 mg at 04/11/18 9741  . cyanocobalamin ((VITAMIN B-12)) injection 1,000 mcg   1,000 mcg Intramuscular Q30 days Danae Orleans, PA-C   1,000 mcg at 04/11/18 1046  . diazepam (VALIUM) tablet 5 mg  5 mg Oral BID PRN Danae Orleans, PA-C   5 mg at 04/09/18 0758  . diphenhydrAMINE (BENADRYL) capsule 25 mg  25 mg Oral Q6H PRN Danae Orleans, PA-C   25 mg at 04/09/18 0947  . docusate sodium (COLACE) capsule 100 mg  100 mg Oral BID Danae Orleans, PA-C   100 mg at 04/11/18 6384  . enoxaparin (LOVENOX) injection 30 mg  30 mg Subcutaneous Q24H Danae Orleans, PA-C   30 mg at 04/11/18 0817  . ferrous sulfate tablet 325 mg  325 mg Oral TID PC Danae Orleans, PA-C   325 mg at 04/11/18 0818  . HYDROmorphone (DILAUDID) injection 1 mg  1 mg Intravenous Q2H PRN Danae Orleans, PA-C   1 mg at 04/10/18 1252  . HYDROmorphone (DILAUDID) tablet 1-2 mg  1-2 mg Oral Q4H PRN Danae Orleans, PA-C   2 mg at 04/11/18 0953  . insulin aspart (novoLOG) injection 0-15 Units  0-15 Units Subcutaneous TID WC Mariel Aloe, MD      . insulin detemir (LEVEMIR) injection 60 Units  60 Units Subcutaneous QHS Mariel Aloe, MD      . menthol-cetylpyridinium (CEPACOL) lozenge 3 mg  1 lozenge Oral PRN Danae Orleans, PA-C       Or  . phenol (CHLORASEPTIC) mouth spray 1 spray  1 spray Mouth/Throat PRN Babish, Matthew, PA-C      . metoCLOPramide (REGLAN) tablet 5-10 mg  5-10 mg Oral Q8H PRN Danae Orleans, PA-C       Or  . metoCLOPramide (REGLAN) injection 5-10 mg  5-10 mg Intravenous Q8H PRN Babish, Matthew, PA-C      . metoprolol tartrate (LOPRESSOR) tablet 100 mg  100 mg Oral BID Babish, Matthew, PA-C   100 mg at 04/11/18 0953  . ondansetron (ZOFRAN) injection 4 mg  4 mg Intravenous Q6H PRN Danae Orleans, PA-C   4 mg at 04/11/18 1345  . pantoprazole (PROTONIX) EC tablet 80 mg  80 mg Oral Q1200 Danae Orleans, PA-C   80 mg at 04/11/18 1201     Discharge Medications: Please see discharge summary for a list of discharge medications.  Relevant Imaging Results:  Relevant Lab  Results:   Additional Information [id number]  Nila Nephew, LCSW

## 2018-04-11 NOTE — Progress Notes (Signed)
Inpatient Diabetes Program Recommendations  AACE/ADA: New Consensus Statement on Inpatient Glycemic Control (2015)  Target Ranges:  Prepandial:   less than 140 mg/dL      Peak postprandial:   less than 180 mg/dL (1-2 hours)      Critically ill patients:  140 - 180 mg/dL   Results for Heather [name], Heather A "[name]" (MRN [id number]) as of 04/11/2018 08:47  Ref. Range 04/10/2018 08:08 04/10/2018 12:33 04/10/2018 19:05 04/10/2018 22:00 04/11/2018 07:29  Glucose-Capillary Latest Ref Range: 65 - 99 mg/dL 245 (H) 195 (H) 188 (H) 327 (H) 391 (H)   Review of Glycemic Control  Diabetes history: DM 2 Outpatient Diabetes medications: Levemir 80 units Daily, Humalog 21-40 units tid, Victoza 1.8 mg Daily Current orders for Inpatient glycemic control: Levemir 40 units qhs, Novolog Sensitive Correction 0-9 units tid and hs  Inpatient Diabetes Program Recommendations:    Glucose 180-300 range with half of home dose of Levemir. Patient was NPO yesterday was the reason glucose trends decreased. Please consider increasing Levemir to 45-50 units and add Novolog 3 units tid meal coverage if patient is consuming at least 50% of meals.  Thanks,  Tama Headings RN, MSN, BC-ADM, [facility] Inpatient Diabetes Coordinator Team Pager [phone] (8a-5p)

## 2018-04-11 NOTE — Progress Notes (Signed)
Bed offers for pt provided-Pt accepted  [facility] SNF for short term rehab. Admissions (Melanie) began insurance authorization for pt.  Noted that facility not able to to Sunday admissions. Contact over the weekend at [place] ask for [facility] Unit Notified pt's husband- agreeable  Sharren Bridge, MSW, LCSW Clinical Social Work 04/11/2018 [phone] Coverage for [phone]

## 2018-04-11 NOTE — Care Management Important Message (Signed)
Important Message  Patient Details  Name: Heather [name] MRN: [id number] Date of Birth: 03-27-1951   Medicare Important Message Given:  Yes    Kerin Salen 04/11/2018, 11:20 AMImportant Message  Patient Details  Name: Heather [name] MRN: [id number] Date of Birth: 1951-09-11   Medicare Important Message Given:  Yes    Kerin Salen 04/11/2018, 11:20 AM

## 2018-04-11 NOTE — Progress Notes (Addendum)
PROGRESS NOTE    Heather [name]  JQZ:[id number] DOB: [birth year] DOA: 04/07/2018 PCP: [name], MD   Brief Narrative: Heather [name] is a 67 y.o. female with a history of CKD stage 3, diabetes mellitus, hypertension, history of femur fracture. She presented secondary to hip pain from a fall and found to have a right hip fracture. S/p intramedullary nail placement on 6/13   Assessment & Plan:   Principal Problem:   Closed right hip fracture (HCC) Active Problems:   Chronic Nausea and Vomiting - ? gastroapresis   Essential hypertension   Diabetes mellitus with chronic kidney disease (HCC)   Right hip fracture S/p intramedullary nail.  -orthopedic surgery recommendations -PT eval -Anticipate SNF discharge  Essential hypertension Slightly hypertensive overnight. Improved this morning. -Continue metoprolol BID   Anemia Chronic.  Baseline of around 9. Likely related to chronic disease from CKD. Some acute anemia secondary to blood loss from surgery, but patient is asymptomatic. -Transfuse for hemoglobin <7  Acute kidney injury on CKD stage III Baseline creatinine of 1.3-1.5. 2.8 on admission, down to 1.68 with IV fluids -Continue To encourage oral hydration  Diabetes mellitus, insulin dependent Blood sugar elevated. Takes Levemir 80 units at home -Increase to Levemir 60 units qhs -Increase to SSI moderate scale  ?UTI Patient without current urinalysis. Not complaining of symptoms.. Discontinued ceftriaxone   DVT prophylaxis: SCDs Code Status:   Code Status: DNR Family Communication: None at bedside Disposition Plan: Discharge pending orthopedic surgery recommendations/management   Consultants:   Orthopedic surgery  Procedures:   None  Antimicrobials:  Ceftriaxone  Cefazolin    Subjective: Some pain in her right lower thigh area.  Objective: Vitals:   04/10/18 2300 04/11/18 0455 04/11/18 0505 04/11/18 1003  BP: (!) 164/59 (!) 136/51 (!)  141/56 130/77  Pulse: 96 81 81 83  Resp:  16 16 14   Temp:  98.6 F (37 C) 98.1 F (36.7 C) 98 F (36.7 C)  TempSrc:  Axillary Oral Oral  SpO2:  99% 99% 97%  Weight:      Height:        Intake/Output Summary (Last 24 hours) at 04/11/2018 1018 Last data filed at 04/11/2018 0809 Gross per 24 hour  Intake 2480 ml  Output 1525 ml  Net 955 ml   Filed Weights   04/07/18 2343  Weight: 69.9 kg (154 lb)    Examination:  General exam: Appears calm and comfortable Respiratory system: Clear to auscultation. Respiratory effort normal. Cardiovascular system: S1 & S2 heard, RRR. No murmurs, rubs, gallops or clicks. Gastrointestinal system: Abdomen is nondistended, soft and nontender. No organomegaly or masses felt. Normal bowel sounds heard. Central nervous system: Alert and oriented. No focal neurological deficits. Extremities: No edema. No calf tenderness. Right leg with dressing over knee and upper thigh that are clean Skin: No cyanosis. No rashes Psychiatry: Judgement and insight appear normal. Mood & affect appropriate.     Data Reviewed: I have personally reviewed following labs and imaging studies  CBC: Recent Labs  Lab 04/08/18 0056 04/10/18 0904 04/11/18 0607  WBC 9.5 7.6 6.6  HGB 10.1* 9.0* 7.6*  HCT 31.0* 27.4* 23.9*  MCV 92.8 91.9 90.2  PLT 197 166 622   Basic Metabolic Panel: Recent Labs  Lab 04/08/18 0056 04/10/18 0904 04/11/18 0607  NA 138 136 134*  K 3.8 4.2 4.7  CL 103 104 105  CO2 22 22 20*  GLUCOSE 332* 248* 411*  BUN 46* 40* 34*  CREATININE 2.80* 1.95*  1.68*  CALCIUM 9.0 9.4 9.0   GFR: Estimated Creatinine Clearance: 31.3 mL/min (A) (by C-G formula based on SCr of 1.68 mg/dL (H)). Liver Function Tests: No results for input(s): AST, ALT, ALKPHOS, BILITOT, PROT, ALBUMIN in the last 168 hours. No results for input(s): LIPASE, AMYLASE in the last 168 hours. No results for input(s): AMMONIA in the last 168 hours. Coagulation Profile: No results  for input(s): INR, PROTIME in the last 168 hours. Cardiac Enzymes: No results for input(s): CKTOTAL, CKMB, CKMBINDEX, TROPONINI in the last 168 hours. BNP (last 3 results) No results for input(s): PROBNP in the last 8760 hours. HbA1C: No results for input(s): HGBA1C in the last 72 hours. CBG: Recent Labs  Lab 04/10/18 0808 04/10/18 1233 04/10/18 1905 04/10/18 2200 04/11/18 0729  GLUCAP 245* 195* 188* 327* 391*   Lipid Profile: No results for input(s): CHOL, HDL, LDLCALC, TRIG, CHOLHDL, LDLDIRECT in the last 72 hours. Thyroid Function Tests: No results for input(s): TSH, T4TOTAL, FREET4, T3FREE, THYROIDAB in the last 72 hours. Anemia Panel: No results for input(s): VITAMINB12, FOLATE, FERRITIN, TIBC, IRON, RETICCTPCT in the last 72 hours. Sepsis Labs: No results for input(s): PROCALCITON, LATICACIDVEN in the last 168 hours.  Recent Results (from the past 240 hour(s))  Surgical PCR screen     Status: None   Collection Time: 04/08/18  6:28 AM  Result Value Ref Range Status   MRSA, PCR NEGATIVE NEGATIVE Final   Staphylococcus aureus NEGATIVE NEGATIVE Final    Comment: (NOTE) The Xpert SA Assay (FDA approved for NASAL specimens in patients 17 years of age and older), is one component of a comprehensive surveillance program. It is not intended to diagnose infection nor to guide or monitor treatment. Performed at [facility], [place] [address], [place], [place] [address]          Radiology Studies: Dg Knee 1-2 Views Right  Result Date: 04/10/2018 CLINICAL DATA:  Hardware removal. EXAM: RIGHT KNEE - 1-2 VIEW; OPERATIVE RIGHT HIP WITH PELVIS COMPARISON:  04/08/2018 and 03/30/2017 FINDINGS: Evidence of patient's right total knee arthroplasty intact. Interval removal of right femoral intramedullary nail. Placement of new proximal right femoral intramedullary nail with associated screw bridging the femoral neck into the femoral head fixating patient's femoral  neck fracture. Hardware is intact with anatomic alignment over the fracture site. IMPRESSION: Fixation patient's right femoral neck fracture with hardware intact and anatomic alignment over the fracture site. Electronically Signed   By: Marin Olp M.D.   On: 04/10/2018 21:36   Dg C-arm 1-60 Min-no Report  Result Date: 04/10/2018 Fluoroscopy was utilized by the requesting physician.  No radiographic interpretation.   Dg Hip Operative Unilat With Pelvis Right  Result Date: 04/10/2018 CLINICAL DATA:  Hardware removal. EXAM: RIGHT KNEE - 1-2 VIEW; OPERATIVE RIGHT HIP WITH PELVIS COMPARISON:  04/08/2018 and 03/30/2017 FINDINGS: Evidence of patient's right total knee arthroplasty intact. Interval removal of right femoral intramedullary nail. Placement of new proximal right femoral intramedullary nail with associated screw bridging the femoral neck into the femoral head fixating patient's femoral neck fracture. Hardware is intact with anatomic alignment over the fracture site. IMPRESSION: Fixation patient's right femoral neck fracture with hardware intact and anatomic alignment over the fracture site. Electronically Signed   By: Marin Olp M.D.   On: 04/10/2018 21:36        Scheduled Meds: . allopurinol  50 mg Oral Daily  . cholecalciferol  2,000 Units Oral Daily  . citalopram  20 mg Oral Daily  .  cyanocobalamin  1,000 mcg Intramuscular Q30 days  . docusate sodium  100 mg Oral BID  . enoxaparin (LOVENOX) injection  30 mg Subcutaneous Q24H  . ferrous sulfate  325 mg Oral TID PC  . insulin aspart  0-9 Units Subcutaneous TID WC & HS  . insulin detemir  40 Units Subcutaneous QHS  . metoprolol tartrate  100 mg Oral BID  . pantoprazole  80 mg Oral Q1200   Continuous Infusions: . cefTRIAXone (ROCEPHIN)  IV Stopped (04/10/18 2327)     LOS: 4 days     Cordelia Poche, MD [facility] 04/11/2018, 10:18 AM Pager: [phone]  If 7PM-7AM, please contact  night-coverage [email or web address] 04/11/2018, 10:18 AM

## 2018-04-11 NOTE — Evaluation (Signed)
Physical Therapy Evaluation Patient Details Name: Heather [name] MRN: [id number] DOB: Nov 16, 1950 Today's Date: 04/11/2018   History of Present Illness  67 yo fenmale admiteed after fall. S/P Removal of right femoral deep hardware from periprosthetic fracture in 6/18, now Open reduction internal fixation of right intertrochanteric femur fracture from fall on 04/07/18.  Clinical Impression  The patient is complaining of pain of the right ankle to touch medial and lateral malleoli , dorsum. Patient's foot was positioned in plantarflex  and inversion upon  Inspection by PT> Assisted with AAROM of the right ankle. Patient did fall to the right  Side. Will assess further tolerance to weight bear ion the foot.   the patient is c/o significant pain with attempts to mobilize, slide in bed. Heather [name] attempt further sitting at bed edge next visit.  Pt admitted with above diagnosis. Pt currently with functional limitations due to the deficits listed below (see PT Problem List).  Pt will benefit from skilled PT to increase their independence and safety with mobility to allow discharge to the venue listed below.       Follow Up Recommendations SNF    Equipment Recommendations  None recommended by PT    Recommendations for Other Services       Precautions / Restrictions Precautions Precautions: Fall Restrictions RLE Weight Bearing: Partial weight bearing RLE Partial Weight Bearing Percentage or Pounds: 50%      Mobility  Bed Mobility Overal bed mobility: Needs Assistance Bed Mobility: Supine to Sit     Supine to sit: +2 for physical assistance;Total assist;+2 for safety/equipment     General bed mobility comments: unable to complete attempt to sit up due to patient c/o pain of the right  leg  Transfers                    Ambulation/Gait                Stairs            Wheelchair Mobility    Modified Rankin (Stroke Patients Only)       Balance                                              Pertinent Vitals/Pain Pain Assessment: 0-10 Pain Score: 8  Pain Location: right ankle medial and lateral with attempts to move to neutral, right hip and kanee with moving., pain with attempts to raise the ritght UE against gravity. Pain Descriptors / Indicators: Aching;Discomfort;Grimacing;Guarding;Pressure;Stabbing Pain Intervention(s): Monitored during session;Patient requesting pain meds-RN notified;Limited activity within patient's tolerance    Home Living Family/patient expects to be discharged to:: Skilled nursing facility Living Arrangements: Spouse/significant other                    Prior Function Level of Independence: Independent               Hand Dominance        Extremity/Trunk Assessment   Upper Extremity Assessment Upper Extremity Assessment: RUE deficits/detail RUE Deficits / Details: decreasd active elevation of the right  UE, pain associated with attempts to raise.    Lower Extremity Assessment Lower Extremity Assessment: RLE deficits/detail RLE Deficits / Details: decreased active dorsiflexion, ankle found positioned in unversion and  plantarflexion with sifnificant pain involved to attempt moving to neutral.  Did not tolerate attempts to  flex the knee and hip       Communication      Cognition Arousal/Alertness: Awake/alert Behavior During Therapy: Anxious;WFL for tasks assessed/performed Overall Cognitive Status: Within Functional Limits for tasks assessed                                        General Comments      Exercises Total Joint Exercises Ankle Circles/Pumps: AAROM;Right;10 reps;Left   Assessment/Plan    PT Assessment Patient needs continued PT services  PT Problem List Decreased strength;Decreased range of motion;Decreased knowledge of use of DME;Decreased activity tolerance;Decreased safety awareness;Decreased knowledge of precautions;Decreased  mobility       PT Treatment Interventions DME instruction;Functional mobility training;Therapeutic activities;Therapeutic exercise;Patient/family education    PT Goals (Current goals can be found in the Care Plan section)  Acute Rehab PT Goals Patient Stated Goal: to go to rehab PT Goal Formulation: With patient Time For Goal Achievement: 04/25/18 Potential to Achieve Goals: Fair    Frequency Min 2X/week   Barriers to discharge Decreased caregiver support      Co-evaluation               AM-PAC PT "6 Clicks" Daily Activity  Outcome Measure Difficulty turning over in bed (including adjusting bedclothes, sheets and blankets)?: Unable Difficulty moving from lying on back to sitting on the side of the bed? : Unable Difficulty sitting down on and standing up from a chair with arms (e.g., wheelchair, bedside commode, etc,.)?: Unable Help needed moving to and from a bed to chair (including a wheelchair)?: Total Help needed walking in hospital room?: Total Help needed climbing 3-5 steps with a railing? : Total 6 Click Score: 6    End of Session   Activity Tolerance: Patient limited by pain Patient left: in bed;with call bell/phone within reach;with bed alarm set Nurse Communication: Mobility status;Need for lift equipment PT Visit Diagnosis: Unsteadiness on feet (R26.81);Pain Pain - Right/Left: Right Pain - part of body: Shoulder;Arm;Hip;Knee;Leg;Ankle and joints of foot    Time: 1030-1108 PT Time Calculation (min) (ACUTE ONLY): 38 min   Charges:   PT Evaluation $PT Eval Moderate Complexity: 1 Mod PT Treatments $Therapeutic Activity: 23-37 mins   PT G CodesTresa [name] PT [phone]   Heather [name] 04/11/2018, 12:29 PM

## 2018-04-11 NOTE — Progress Notes (Signed)
Physical Therapy Treatment Patient Details Name: Heather [name] MRN: [id number] DOB: [birth year] Today's Date: 04/11/2018    History of Present Illness 67 yo female admiteed after fall. S/P Removal of right femoral deep hardware from periprosthetic fracture in 6/18, now Open reduction internal fixation of right intertrochanteric femur fracture from fall on 04/07/18.    PT Comments    The patient is limited by right LE pain with any attempts to move. The right  Ankle appears improved with increased actve ROM. Demonstrated to patient on self ROM of the right ankle. Continue PT efforts.   Follow Up Recommendations  SNF     Equipment Recommendations  None recommended by PT    Recommendations for Other Services       Precautions / Restrictions Precautions Precautions: Fall Restrictions RLE Weight Bearing: Partial weight bearing RLE Partial Weight Bearing Percentage or Pounds: 50%    Mobility  Bed Mobility Overal bed mobility: Needs Assistance Bed Mobility: Supine to Sit;Sit to Supine     Supine to sit: Total assist;+2 for physical assistance;+2 for safety/equipment Sit to supine: +2 for physical assistance;+2 for safety/equipment;Total assist   General bed mobility comments: support of the right leg throughout , used bed pad to slide patient. Gained a partial sitting. does not tolerate right knee flexed  Transfers                 General transfer comment: unable  Ambulation/Gait                 Stairs             Wheelchair Mobility    Modified Rankin (Stroke Patients Only)       Balance                                            Cognition Arousal/Alertness: Awake/alert Behavior During Therapy: Anxious;WFL for tasks assessed/performed Overall Cognitive Status: Within Functional Limits for tasks assessed                                        Exercises Total Joint Exercises Ankle Circles/Pumps:  AAROM;Right;10 reps;Left    General Comments        Pertinent Vitals/Pain Pain Assessment: 0-10 Pain Score: 8  Pain Location: right knee Pain Descriptors / Indicators: Contraction;Discomfort;Grimacing;Guarding Pain Intervention(s): Ice applied;Repositioned    Home Living                      Prior Function            PT Goals (current goals can now be found in the care plan section) Acute Rehab PT Goals Patient Stated Goal: to go to rehab PT Goal Formulation: With patient Time For Goal Achievement: 04/25/18 Potential to Achieve Goals: Fair Progress towards PT goals: Not progressing toward goals - comment(limited by pain)    Frequency    Min 2X/week      PT Plan Current plan remains appropriate    Co-evaluation              AM-PAC PT "6 Clicks" Daily Activity  Outcome Measure  Difficulty turning over in bed (including adjusting bedclothes, sheets and blankets)?: Unable Difficulty moving from lying on back to sitting on the side of the bed? :  Unable Difficulty sitting down on and standing up from a chair with arms (e.g., wheelchair, bedside commode, etc,.)?: Unable Help needed moving to and from a bed to chair (including a wheelchair)?: Total Help needed walking in hospital room?: Total Help needed climbing 3-5 steps with a railing? : Total 6 Click Score: 6    End of Session   Activity Tolerance: Patient limited by pain Patient left: in bed;with call bell/phone within reach;with bed alarm set Nurse Communication: Mobility status;Need for lift equipment PT Visit Diagnosis: Unsteadiness on feet (R26.81);Pain Pain - Right/Left: Right Pain - part of body: Shoulder;Arm;Hip;Knee;Leg;Ankle and joints of foot     Time: 1414-1441 PT Time Calculation (min) (ACUTE ONLY): 27 min  Charges:  $Therapeutic Activity: 23-37 mins                    G CodesTresa Endo PT [phone]    [name] 04/11/2018, 4:20 PM

## 2018-04-11 NOTE — Op Note (Signed)
NAME: [name], [name] MEDICAL RECORD [id number] ACCOUNT [id number] DATE OF BIRTH:08/14/1951 FACILITY: [facility] LOCATION: [facility] PHYSICIAN:Heather Mckenzie D. Alvan Dame, MD  OPERATIVE REPORT  DATE OF PROCEDURE:  04/10/2018  PREOPERATIVE DIAGNOSIS:  Right intertrochanteric femur fracture associated with retained femoral nail from previous open reduction internal fixation of the right distal periprosthetic fracture with routine healing.  POSTOPERATIVE DIAGNOSIS:  Right intertrochanteric femur fracture associated with retained femoral nail from previous open reduction internal fixation of the right distal periprosthetic fracture with routine healing.  PROCEDURES:   1.  Removal of right femoral deep hardware, orthopedic implant. 2.  Open reduction internal fixation of right intertrochanteric femur fracture utilizing a Biomet Affixus nail, 11 mm x 180 mm 130 degree lag screw and a distal interlock.  SURGEON:  Heather Cancel, MD  ASSISTANT:  Surgical team.  Mr. Heather [name] was present for the entirety of the first case, which was removal of hardware.  He was utilized for preoperative positioning, perioperative management of the operative extremity, general facilitation of the  case and primary wound closure.  He was not present for the second portion of the case.  ANESTHESIA:  General.  SPECIMENS:  None.  COMPLICATIONS:  None.  BLOOD LOSS:  About 150 mL total.  INDICATIONS:  The patient is a 67 year old female with a history of a right total knee replacement. This was performed by an outside Psychologist, sport and exercise.  She unfortunately had a fall and had a distal femur fracture.  Approximately one year ago,  I had performed an  open reduction internal fixation with a retrograde nail through her knee replacement.  This fracture did go on to heal.  She was in her routine state of health until she fell on 04/07/2018.  She was transferred from her local hospital to [facility] for  my management.  Fracture revealed  lateral cortex disruption associated with intertrochanteric pattern.  Thus, a comminuted pattern of the intertrochanteric.  Based on this pattern, I felt that the best stability for the fracture pattern was to provide an  intramedullary nail.  Thus, we discussed removal of the distal femoral nail in order to place the proximal Affixus nail.  Risks and benefits of fracture healing were all reviewed and discussed.  Consent was obtained for benefit of pain relief.  DESCRIPTION OF PROCEDURE:  The patient was brought to the operative theater.  Once adequate anesthesia, preoperative antibiotics administered.  She was positioned initially first on a standard OR table, allowing for the potential utilization of  fluoroscopy in the proximal thigh.  A 10/15 drape was placed into the perineum.  The right lower extremity was then prepped and draped in a sterile fashion.  Initiation of the first case was carried out following a timeout identifying the patient, the  planned procedure and extremity.  Her previous knee incision was opened and soft tissue planes were created and then an arthrotomy made encountering clear synovial fluid.  Following initial synovectomy and scar debridement, the nail was identified in the  notch.  I was able to identify the 4 screws that were placed into the distal portion of the nail.  We unlocked the nail locking mechanism through the distal aspect of the nail and then removed all of the screws.  I then inserted the insertion jig.  Once  this was in place and secured, I removed the proximal interlock.  Once this screw was removed, the nail was backed out.  The wounds were irrigated with normal saline solution.  The knee wound was closed  with a standard arthrotomy, closed with #1 Vicryl  and #1 Stratafix suture, 2-0 Vicryl and a running Monocryl.  The proximal wound was closed with Vicryl.  At this point, the wounds were cleaned, dried and dressed sterilely using surgical glue and Aquacel  dressing.  At this point,  upon conclusion of this case, we transitioned her over to the [facility] table.  Once she was adequately and safely padded and positioned with a perineal post.  The left leg was flexed and abducted out of the way.  The right foot was placed in  a traction boot.  Once this was all prepared under fluoroscopic imaging, the fracture was reduced to a near anatomic position with traction and internal rotation.  Once this was completed, the right hip from the iliac crest to the knee was prepped and  draped in sterile fashion using shower curtain technique.  A second timeout was carried out identifying the patient, the planned procedure and extremity.  Fluoroscopy was brought back to the field.  The tip of the trochanter identified.  An incision was  then made lateral and proximal to this.  Soft tissue dissection was carried through the gluteal fascia.  A guidewire was then inserted into the tip of the trochanter past the fracture site.  I then drilled up into the proximal femur.  Based on the  tightness of her canal and previous removal of 10 that mm nail, I passed the ball tip guidewire and reamed to 12 mm.  An 11 x 180 mm nail for this right hip was then placed by hand into its appropriate depth.  Under fluoroscopic imaging, a guidewire was  inserted into the center of the femoral head in AP and lateral planes.  I measured and selected a 95 mm lag screw and drilled for this.  The lag screw was then placed and then using the compression wheel and traction let off the femur, I was able to  medialize the shaft to the fracture site.  We then tightened down the locking bolt proximally and backed it off a quarter turn to allow for further compression.  A single distal interlock was placed measuring 32 mm.  Once this was completed, final  radiographs were obtained.  The wounds were irrigated with normal saline solution.  The proximal wound was closed in layers with #1 Vicryl in the gluteal fascia,  2-0 Vicryl and running Monocryl.  The 2 distal incisions were closed with 2-0 Vicryl.  The  lateral side of her hip was then cleaned, dried and dressed sterilely using surgical glue and Aquacel dressings.  Once this was completed, she was extubated and brought to the recovery room in stable condition.  At this point, we will allow her to be partial weightbearing on this right lower extremity for the first 6-12 weeks depending on healing.  AN/NUANCE  D:04/10/2018 T:04/11/2018 JOB:[id number]

## 2018-04-11 NOTE — Progress Notes (Signed)
     Subjective: 1 Day Post-Op Procedure(s) (LRB): HARDWARE REMOVAL RIGHT DISTAL FEMUR (Right) INTRAMEDULLARY (IM) NAIL FEMORAL (Right)   Patient reports pain as mild/moderate. No events throughout the night.  Dr. Alvan Dame discussed the procedures, findings and expectations going further.    Objective:   VITALS:   Vitals:   04/11/18 0455 04/11/18 0505  BP: (!) 136/51 (!) 141/56  Pulse: 81 81  Resp: 16 16  Temp: 98.6 F (37 C) 98.1 F (36.7 C)  SpO2: 99% 99%    Dorsiflexion/Plantar flexion intact Incision: dressing C/D/I No cellulitis present Compartment soft  LABS Recent Labs    04/10/18 0904 04/11/18 0607  HGB 9.0* 7.6*  HCT 27.4* 23.9*  WBC 7.6 6.6  PLT 166 176    Recent Labs    04/10/18 0904 04/11/18 0607  NA 136 134*  K 4.2 4.7  BUN 40* 34*  CREATININE 1.95* 1.68*  GLUCOSE 248* 411*     Assessment/Plan: 1 Day Post-Op Procedure(s) (LRB): HARDWARE REMOVAL RIGHT DISTAL FEMUR (Right) INTRAMEDULLARY (IM) NAIL FEMORAL (Right)  Up with therapy Discharge disposition to be determined PWB on the right LE    [place]. Francella Barnett   PAC  04/11/2018, 8:31 AM

## 2018-04-12 LAB — CBC
HCT: 22.6 % — ABNORMAL LOW (ref 36.0–46.0)
Hemoglobin: 7.6 g/dL — ABNORMAL LOW (ref 12.0–15.0)
MCH: 30.4 pg (ref 26.0–34.0)
MCHC: 33.6 g/dL (ref 30.0–36.0)
MCV: 90.4 fL (ref 78.0–100.0)
PLATELETS: 199 10*3/uL (ref 150–400)
RBC: 2.5 MIL/uL — ABNORMAL LOW (ref 3.87–5.11)
RDW: 14.1 % (ref 11.5–15.5)
WBC: 8.2 10*3/uL (ref 4.0–10.5)

## 2018-04-12 LAB — BASIC METABOLIC PANEL
ANION GAP: 7 (ref 5–15)
BUN: 39 mg/dL — ABNORMAL HIGH (ref 6–20)
CALCIUM: 9.2 mg/dL (ref 8.9–10.3)
CO2: 24 mmol/L (ref 22–32)
CREATININE: 1.55 mg/dL — AB (ref 0.44–1.00)
Chloride: 105 mmol/L (ref 101–111)
GFR, EST AFRICAN AMERICAN: 39 mL/min — AB (ref >60)
GFR, EST NON AFRICAN AMERICAN: 34 mL/min — AB (ref >60)
Glucose, Bld: 317 mg/dL — ABNORMAL HIGH (ref 65–99)
Potassium: 4.4 mmol/L (ref 3.5–5.1)
SODIUM: 136 mmol/L (ref 135–145)

## 2018-04-12 LAB — GLUCOSE, CAPILLARY
GLUCOSE-CAPILLARY: 272 mg/dL — AB (ref 65–99)
GLUCOSE-CAPILLARY: 271 mg/dL — AB (ref 65–99)
Glucose-Capillary: 246 mg/dL — ABNORMAL HIGH (ref 65–99)
Glucose-Capillary: 313 mg/dL — ABNORMAL HIGH (ref 65–99)

## 2018-04-12 MED ORDER — OXYCODONE HCL 5 MG PO TABS
5.0000 mg | ORAL_TABLET | ORAL | Status: DC | PRN
  Administered 2018-04-12 – 2018-04-14 (×8): 5 mg via ORAL
  Filled 2018-04-12 (×9): qty 1

## 2018-04-12 MED ORDER — INSULIN DETEMIR 100 UNIT/ML ~~LOC~~ SOLN
80.0000 [IU] | Freq: Every day | SUBCUTANEOUS | Status: DC
  Administered 2018-04-12 – 2018-04-13 (×2): 80 [IU] via SUBCUTANEOUS
  Filled 2018-04-12 (×3): qty 0.8

## 2018-04-12 NOTE — Plan of Care (Signed)
Plan of care discussed.   

## 2018-04-12 NOTE — Progress Notes (Signed)
PROGRESS NOTE    Heather [name]  ZSW:[id number] DOB: Jun 25, 1951 DOA: 04/07/2018 PCP: [name], MD   Brief Narrative: Heather [name] is a 67 y.o. female with a history of CKD stage 3, diabetes mellitus, hypertension, history of femur fracture. She presented secondary to hip pain from a fall and found to have a right hip fracture. S/p intramedullary nail placement on 6/13   Assessment & Plan:   Principal Problem:   Closed right hip fracture (HCC) Active Problems:   Chronic Nausea and Vomiting - ? gastroapresis   Essential hypertension   Diabetes mellitus with chronic kidney disease (HCC)   Right hip fracture S/p intramedullary nail. Doing well. Some right lower thigh pain. -orthopedic surgery recommendations -PT eval: SNF  Essential hypertension Mostly well controlled -Continue metoprolol BID    Anemia Chronic.  Baseline of around 9. Likely related to chronic disease from CKD. Some acute anemia secondary to blood loss from surgery, but patient is asymptomatic. -Transfuse for hemoglobin <7  Acute kidney injury on CKD stage III Baseline creatinine of 1.3-1.5. 2.8 on admission, down to 1.55 with IV fluids -Oral hydration  Diabetes mellitus, insulin dependent Blood sugar continues to be elevated. Takes Levemir 80 units at home -Increase to Levemir 80 units qhs -Continue SSI moderate scale  ?UTI Patient without current urinalysis. Not complaining of symptoms.. Discontinued ceftriaxone   DVT prophylaxis: SCDs Code Status:   Code Status: DNR Family Communication: None at bedside Disposition Plan: Discharge pending orthopedic surgery recommendations/management   Consultants:   Orthopedic surgery  Procedures:   None  Antimicrobials:  Ceftriaxone  Cefazolin    Subjective: Continues to have some right lower lateral thigh pain.  Objective: Vitals:   04/11/18 1340 04/11/18 1855 04/11/18 2256 04/12/18 0650  BP: 138/60 (!) 138/55 (!) 119/42 (!)  146/62  Pulse: 81 91 83 72  Resp: 18 18 19 13   Temp: 98.6 F (37 C) 98.1 F (36.7 C) 98.3 F (36.8 C) 98.9 F (37.2 C)  TempSrc: Oral Oral Oral   SpO2: 100% 99% 97% 97%  Weight:      Height:        Intake/Output Summary (Last 24 hours) at 04/12/2018 0857 Last data filed at 04/12/2018 0600 Gross per 24 hour  Intake 700 ml  Output 1025 ml  Net -325 ml   Filed Weights   04/07/18 2343  Weight: 69.9 kg (154 lb)    Examination:  General exam: Appears calm and comfortable Respiratory system: Clear to auscultation. Respiratory effort normal. Cardiovascular system: S1 & S2 heard, RRR. No murmurs, rubs, gallops or clicks. Gastrointestinal system: Abdomen is nondistended, soft and nontender. No organomegaly or masses felt. Normal bowel sounds heard. Central nervous system: Alert and oriented. No focal neurological deficits. Extremities: No edema. No calf tenderness. Some mild tenderness at of right lateral distal thigh Skin: No cyanosis. No rashes Psychiatry: Judgement and insight appear normal. Mood & affect appropriate.     Data Reviewed: I have personally reviewed following labs and imaging studies  CBC: Recent Labs  Lab 04/08/18 0056 04/10/18 0904 04/11/18 0607 04/12/18 0536  WBC 9.5 7.6 6.6 8.2  HGB 10.1* 9.0* 7.6* 7.6*  HCT 31.0* 27.4* 23.9* 22.6*  MCV 92.8 91.9 90.2 90.4  PLT 197 166 176 322   Basic Metabolic Panel: Recent Labs  Lab 04/08/18 0056 04/10/18 0904 04/11/18 0607 04/12/18 0536  NA 138 136 134* 136  K 3.8 4.2 4.7 4.4  CL 103 104 105 105  CO2 22 22 20*  24  GLUCOSE 332* 248* 411* 317*  BUN 46* 40* 34* 39*  CREATININE 2.80* 1.95* 1.68* 1.55*  CALCIUM 9.0 9.4 9.0 9.2   GFR: Estimated Creatinine Clearance: 33.9 mL/min (A) (by C-G formula based on SCr of 1.55 mg/dL (H)). Liver Function Tests: No results for input(s): AST, ALT, ALKPHOS, BILITOT, PROT, ALBUMIN in the last 168 hours. No results for input(s): LIPASE, AMYLASE in the last 168  hours. No results for input(s): AMMONIA in the last 168 hours. Coagulation Profile: No results for input(s): INR, PROTIME in the last 168 hours. Cardiac Enzymes: No results for input(s): CKTOTAL, CKMB, CKMBINDEX, TROPONINI in the last 168 hours. BNP (last 3 results) No results for input(s): PROBNP in the last 8760 hours. HbA1C: No results for input(s): HGBA1C in the last 72 hours. CBG: Recent Labs  Lab 04/11/18 0729 04/11/18 1157 04/11/18 1709 04/11/18 2206 04/12/18 0740  GLUCAP 391* 399* 322* 346* 272*   Lipid Profile: No results for input(s): CHOL, HDL, LDLCALC, TRIG, CHOLHDL, LDLDIRECT in the last 72 hours. Thyroid Function Tests: No results for input(s): TSH, T4TOTAL, FREET4, T3FREE, THYROIDAB in the last 72 hours. Anemia Panel: No results for input(s): VITAMINB12, FOLATE, FERRITIN, TIBC, IRON, RETICCTPCT in the last 72 hours. Sepsis Labs: No results for input(s): PROCALCITON, LATICACIDVEN in the last 168 hours.  Recent Results (from the past 240 hour(s))  Surgical PCR screen     Status: None   Collection Time: 04/08/18  6:28 AM  Result Value Ref Range Status   MRSA, PCR NEGATIVE NEGATIVE Final   Staphylococcus aureus NEGATIVE NEGATIVE Final    Comment: (NOTE) The Xpert SA Assay (FDA approved for NASAL specimens in patients 12 years of age and older), is one component of a comprehensive surveillance program. It is not intended to diagnose infection nor to guide or monitor treatment. Performed at [facility], [place] [address] [place], [place] [address]          Radiology Studies: Dg Knee 1-2 Views Right  Result Date: 04/10/2018 CLINICAL DATA:  Hardware removal. EXAM: RIGHT KNEE - 1-2 VIEW; OPERATIVE RIGHT HIP WITH PELVIS COMPARISON:  04/08/2018 and 03/30/2017 FINDINGS: Evidence of patient's right total knee arthroplasty intact. Interval removal of right femoral intramedullary nail. Placement of new proximal right femoral intramedullary nail  with associated screw bridging the femoral neck into the femoral head fixating patient's femoral neck fracture. Hardware is intact with anatomic alignment over the fracture site. IMPRESSION: Fixation patient's right femoral neck fracture with hardware intact and anatomic alignment over the fracture site. Electronically Signed   By: Marin Olp M.D.   On: 04/10/2018 21:36   Dg C-arm 1-60 Min-no Report  Result Date: 04/10/2018 Fluoroscopy was utilized by the requesting physician.  No radiographic interpretation.   Dg Hip Operative Unilat With Pelvis Right  Result Date: 04/10/2018 CLINICAL DATA:  Hardware removal. EXAM: RIGHT KNEE - 1-2 VIEW; OPERATIVE RIGHT HIP WITH PELVIS COMPARISON:  04/08/2018 and 03/30/2017 FINDINGS: Evidence of patient's right total knee arthroplasty intact. Interval removal of right femoral intramedullary nail. Placement of new proximal right femoral intramedullary nail with associated screw bridging the femoral neck into the femoral head fixating patient's femoral neck fracture. Hardware is intact with anatomic alignment over the fracture site. IMPRESSION: Fixation patient's right femoral neck fracture with hardware intact and anatomic alignment over the fracture site. Electronically Signed   By: Marin Olp M.D.   On: 04/10/2018 21:36        Scheduled Meds: . allopurinol  50  mg Oral Daily  . cholecalciferol  2,000 Units Oral Daily  . citalopram  20 mg Oral Daily  . cyanocobalamin  1,000 mcg Intramuscular Q30 days  . docusate sodium  100 mg Oral BID  . enoxaparin (LOVENOX) injection  30 mg Subcutaneous Q24H  . ferrous sulfate  325 mg Oral TID PC  . insulin aspart  0-15 Units Subcutaneous TID WC  . insulin detemir  80 Units Subcutaneous QHS  . metoprolol tartrate  100 mg Oral BID  . pantoprazole  80 mg Oral Q1200   Continuous Infusions: . cefTRIAXone (ROCEPHIN)  IV Stopped (04/11/18 2213)     LOS: 5 days     Cordelia Poche, MD [facility] 04/12/2018,  8:57 AM Pager: [phone]  If 7PM-7AM, please contact night-coverage [email or web address] 04/12/2018, 8:57 AM

## 2018-04-12 NOTE — Progress Notes (Signed)
Patient states she is unable to dorsiflex right foot.  She moves well with left foot but states she cannot move the right foot the same way.  She can wiggle toes.  Pulses +2.  Called PA [place].  She will discuss with PA who will round tomorrow morning.

## 2018-04-12 NOTE — Progress Notes (Signed)
Subjective: 2 Days Post-Op Procedure(s) (LRB): HARDWARE REMOVAL RIGHT DISTAL FEMUR (Right) INTRAMEDULLARY (IM) NAIL FEMORAL (Right) Patient reports pain as moderate.    Objective: Vital signs in last 24 hours: Temp:  [98 F (36.7 C)-98.9 F (37.2 C)] 98.9 F (37.2 C) (06/15 0650) Pulse Rate:  [72-91] 72 (06/15 0650) Resp:  [13-19] 13 (06/15 0650) BP: (119-146)/(42-77) 146/62 (06/15 0650) SpO2:  [97 %-100 %] 97 % (06/15 0650)  Intake/Output from previous day: 06/14 0701 - 06/15 0700 In: 700 [P.O.:600; IV Piggyback:100] Out: 1625 [Urine:1625] Intake/Output this shift: No intake/output data recorded.  Recent Labs    04/10/18 0904 04/11/18 0607 04/12/18 0536  HGB 9.0* 7.6* 7.6*   Recent Labs    04/11/18 0607 04/12/18 0536  WBC 6.6 8.2  RBC 2.65* 2.50*  HCT 23.9* 22.6*  PLT 176 199   Recent Labs    04/11/18 0607 04/12/18 0536  NA 134* 136  K 4.7 4.4  CL 105 105  CO2 20* 24  BUN 34* 39*  CREATININE 1.68* 1.55*  GLUCOSE 411* 317*  CALCIUM 9.0 9.2   No results for input(s): LABPT, INR in the last 72 hours.  Neurologically intact Neurovascular intact Sensation intact distally No cellulitis present Compartment soft  Dressings dry and intact  Assessment/Plan: 2 Days Post-Op Procedure(s) (LRB): HARDWARE REMOVAL RIGHT DISTAL FEMUR (Right) INTRAMEDULLARY (IM) NAIL FEMORAL (Right) Advance diet Up with therapy  Hemoglobin stable at 7.6 Not having symptoms of anemia. Transfuse only if she becomes symptomatic    Heather [name] 04/12/2018, 8:26 AM

## 2018-04-12 NOTE — Evaluation (Signed)
Occupational Therapy Evaluation Patient Details Name: Heather [name] MRN: [id number] DOB: 05/29/1951 Today's Date: 04/12/2018    History of Present Illness 67 yo female admiteed after fall. S/P Removal of right femoral deep hardware from periprosthetic fracture in 6/18, now Open reduction internal fixation of right intertrochanteric femur fracture from fall on 04/07/18.   Clinical Impression   This 67 yo female admitted and underwent above presents to acute OT with increased pain with movement, decreased mobility, decreased ROM of RLE, 50% WB'ing RLE all affecting her PLOF of being totally independent with all basic and IADLs. She will benefit from acute OT with follow up OT at SNF to get back to PLOF     Follow Up Recommendations  SNF;Supervision/Assistance - 24 hour    Equipment Recommendations  None recommended by OT       Precautions / Restrictions Precautions Precautions: Fall Restrictions Weight Bearing Restrictions: Yes RLE Weight Bearing: Partial weight bearing RLE Partial Weight Bearing Percentage or Pounds: 50%      Mobility Bed Mobility Overal bed mobility: Needs Assistance Bed Mobility: Supine to Sit     Supine to sit: Mod assist;+2 for physical assistance     General bed mobility comments: support of the right leg throughout , used bed pad to help slide patient. Able to get to full sitting EOB with only slight bend in right knee  Transfers Overall transfer level: Needs assistance Equipment used: Rolling walker (2 wheeled) Transfers: Sit to/from [facility] Sit to Stand: Mod assist;+2 physical assistance Stand pivot transfers: Mod assist;+2 physical assistance            Balance Overall balance assessment: Needs assistance Sitting-balance support: No upper extremity supported;Feet supported Sitting balance-Leahy Scale: Good     Standing balance support: Bilateral upper extremity supported Standing balance-Leahy Scale:  Poor Standing balance comment: use of RW and additional external support of therapist                           ADL either performed or assessed with clinical judgement   ADL Overall ADL's : Needs assistance/impaired Eating/Feeding: Independent   Grooming: Set up;Supervision/safety;Sitting   Upper Body Bathing: Supervision/ safety;Set up;Sitting   Lower Body Bathing: Maximal assistance Lower Body Bathing Details (indicate cue type and reason): Mod A +2 sit<>stand from bed Upper Body Dressing : Supervision/safety;Set up;Sitting   Lower Body Dressing: Maximal assistance Lower Body Dressing Details (indicate cue type and reason): Mod A +2 sit<>stand from bed Toilet Transfer: Moderate assistance;+2 for physical assistance;Stand-pivot;RW Toilet Transfer Details (indicate cue type and reason): bed>recliner Toileting- Clothing Manipulation and Hygiene: Moderate assistance Toileting - Clothing Manipulation Details (indicate cue type and reason): Mod A +2 sit<>stand              Vision Patient Visual Report: No change from baseline              Pertinent Vitals/Pain Pain Assessment: 0-10 Pain Score: 7  Pain Location: right LE Pain Descriptors / Indicators: Grimacing;Guarding;Cramping Pain Intervention(s): Limited activity within patient's tolerance;Monitored during session;Repositioned;Ice applied     Hand Dominance  right   Extremity/Trunk Assessment Upper Extremity Assessment Upper Extremity Assessment: Overall WFL for tasks assessed              Cognition Arousal/Alertness: Awake/alert Behavior During Therapy: WFL for tasks assessed/performed Overall Cognitive Status: Within Functional Limits for tasks assessed  Home Living Family/patient expects to be discharged to:: Skilled nursing facility                                                 OT Problem List:  Decreased strength;Decreased range of motion;Impaired balance (sitting and/or standing);Pain;Decreased knowledge of precautions;Decreased knowledge of use of DME or AE      OT Treatment/Interventions: Self-care/ADL training;Balance training;DME and/or AE instruction;Patient/family education    OT Goals(Current goals can be found in the care plan section) Acute Rehab OT Goals Patient Stated Goal: to go to rehab OT Goal Formulation: With patient Time For Goal Achievement: 04/26/18 Potential to Achieve Goals: Good  OT Frequency: Min 2X/week   Barriers to D/C: Decreased caregiver support             AM-PAC PT "6 Clicks" Daily Activity     Outcome Measure Help from another person eating meals?: None Help from another person taking care of personal grooming?: A Little Help from another person toileting, which includes using toliet, bedpan, or urinal?: A Lot Help from another person bathing (including washing, rinsing, drying)?: A Lot Help from another person to put on and taking off regular upper body clothing?: A Little Help from another person to put on and taking off regular lower body clothing?: Total 6 Click Score: 15   End of Session Equipment Utilized During Treatment: Gait belt;Rolling walker Nurse Communication: Mobility status(RN adn NT (+2 Mod A stand pivot with RW, 50% WB'ing))  Activity Tolerance: Patient tolerated treatment well Patient left: in chair;with call bell/phone within reach;with chair alarm set  OT Visit Diagnosis: Unsteadiness on feet (R26.81);Other abnormalities of gait and mobility (R26.89);Pain;History of falling (Z91.81) Pain - Right/Left: Right Pain - part of body: Leg                Time: 9924-2683 OT Time Calculation (min): 19 min Charges:  OT General Charges $OT Visit: 1 Visit OT Evaluation $OT Eval Moderate Complexity: [address], [place] [phone] 04/12/2018

## 2018-04-13 ENCOUNTER — Inpatient Hospital Stay (HOSPITAL_COMMUNITY): Payer: Medicare Other

## 2018-04-13 DIAGNOSIS — M25571 Pain in right ankle and joints of right foot: Secondary | ICD-10-CM

## 2018-04-13 LAB — BASIC METABOLIC PANEL
Anion gap: 8 (ref 5–15)
BUN: 31 mg/dL — AB (ref 6–20)
CO2: 24 mmol/L (ref 22–32)
Calcium: 9.4 mg/dL (ref 8.9–10.3)
Chloride: 108 mmol/L (ref 101–111)
Creatinine, Ser: 1.51 mg/dL — ABNORMAL HIGH (ref 0.44–1.00)
GFR calc Af Amer: 41 mL/min — ABNORMAL LOW (ref >60)
GFR, EST NON AFRICAN AMERICAN: 35 mL/min — AB (ref >60)
GLUCOSE: 207 mg/dL — AB (ref 65–99)
POTASSIUM: 3.7 mmol/L (ref 3.5–5.1)
Sodium: 140 mmol/L (ref 135–145)

## 2018-04-13 LAB — CBC
HCT: 23.3 % — ABNORMAL LOW (ref 36.0–46.0)
Hemoglobin: 7.7 g/dL — ABNORMAL LOW (ref 12.0–15.0)
MCH: 30.4 pg (ref 26.0–34.0)
MCHC: 33 g/dL (ref 30.0–36.0)
MCV: 92.1 fL (ref 78.0–100.0)
PLATELETS: 226 10*3/uL (ref 150–400)
RBC: 2.53 MIL/uL — AB (ref 3.87–5.11)
RDW: 14.5 % (ref 11.5–15.5)
WBC: 8.3 10*3/uL (ref 4.0–10.5)

## 2018-04-13 LAB — GLUCOSE, CAPILLARY
GLUCOSE-CAPILLARY: 193 mg/dL — AB (ref 65–99)
Glucose-Capillary: 171 mg/dL — ABNORMAL HIGH (ref 65–99)
Glucose-Capillary: 196 mg/dL — ABNORMAL HIGH (ref 65–99)
Glucose-Capillary: 215 mg/dL — ABNORMAL HIGH (ref 65–99)

## 2018-04-13 MED ORDER — HYDROCHLOROTHIAZIDE 12.5 MG PO CAPS
12.5000 mg | ORAL_CAPSULE | Freq: Every day | ORAL | Status: DC
  Administered 2018-04-13 – 2018-04-14 (×2): 12.5 mg via ORAL
  Filled 2018-04-13 (×2): qty 1

## 2018-04-13 MED ORDER — LIP MEDEX EX OINT
TOPICAL_OINTMENT | CUTANEOUS | Status: AC
  Filled 2018-04-13: qty 7

## 2018-04-13 MED ORDER — LIP MEDEX EX OINT
TOPICAL_OINTMENT | CUTANEOUS | Status: AC
  Administered 2018-04-13: 13:00:00
  Filled 2018-04-13: qty 7

## 2018-04-13 MED ORDER — ENOXAPARIN SODIUM 40 MG/0.4ML ~~LOC~~ SOLN
40.0000 mg | SUBCUTANEOUS | Status: DC
  Administered 2018-04-14: 40 mg via SUBCUTANEOUS
  Filled 2018-04-13: qty 0.4

## 2018-04-13 NOTE — Plan of Care (Signed)
Patient struggling with pain management, inability to ambulate safely with PWB precautions and lack of ability to dosiflex the right foot (new issue).

## 2018-04-13 NOTE — Progress Notes (Signed)
Pharmacy: LMWH dose adjustment  Plan: increase LMWH 30 q24 to 40 q24 for Cr Cl > 30 ml/min  Eudelia Bunch, Pharm.D. [phone] 04/13/2018 9:30 AM

## 2018-04-13 NOTE — Progress Notes (Signed)
Subjective: 3 Days Post-Op Procedure(s) (LRB): HARDWARE REMOVAL RIGHT DISTAL FEMUR (Right) INTRAMEDULLARY (IM) NAIL FEMORAL (Right) Patient reports pain as 3 on 0-10 scale.  Painful dorsiflexion of her foot.HBg is stable at 7.7Her vitals are stable.  Objective: Vital signs in last 24 hours: Temp:  [98.3 F (36.8 C)-98.5 F (36.9 C)] 98.5 F (36.9 C) (06/16 0557) Pulse Rate:  [74-85] 74 (06/16 0557) Resp:  [16] 16 (06/16 0557) BP: (162-186)/(61-70) 162/64 (06/16 0557) SpO2:  [96 %-100 %] 100 % (06/16 0557)  Intake/Output from previous day: 06/15 0701 - 06/16 0700 In: 100 [IV Piggyback:100] Out: 1300 [Urine:1300] Intake/Output this shift: No intake/output data recorded.  Recent Labs    04/11/18 0607 04/12/18 0536 04/13/18 0554  HGB 7.6* 7.6* 7.7*   Recent Labs    04/12/18 0536 04/13/18 0554  WBC 8.2 8.3  RBC 2.50* 2.53*  HCT 22.6* 23.3*  PLT 199 226   Recent Labs    04/12/18 0536 04/13/18 0554  NA 136 140  K 4.4 3.7  CL 105 108  CO2 24 24  BUN 39* 31*  CREATININE 1.55* 1.51*  GLUCOSE 317* 207*  CALCIUM 9.2 9.4   No results for input(s): LABPT, INR in the last 72 hours.  No cellulitis present  No major issues.  Assessment/Plan: 3 Days Post-Op Procedure(s) (LRB): HARDWARE REMOVAL RIGHT DISTAL FEMUR (Right) INTRAMEDULLARY (IM) NAIL FEMORAL (Right) Up with therapy    Latanya Maudlin 04/13/2018, 9:23 AM

## 2018-04-13 NOTE — Progress Notes (Signed)
PROGRESS NOTE    Heather [name]  SAY:[id number] DOB: Oct 31, 1950 DOA: 04/07/2018 PCP: [name], MD   Brief Narrative: Heather [name] is a 67 y.o. female with a history of CKD stage 3, diabetes mellitus, hypertension, history of femur fracture. She presented secondary to hip pain from a fall and found to have a right hip fracture. S/p intramedullary nail placement on 6/13   Assessment & Plan:   Principal Problem:   Closed right hip fracture (HCC) Active Problems:   Chronic Nausea and Vomiting - ? gastroapresis   Essential hypertension   Diabetes mellitus with chronic kidney disease (HCC)   Right hip fracture S/p intramedullary nail. Doing well. Some right lower thigh pain. -orthopedic surgery recommendations -PT eval: SNF  Essential hypertension Poorly controlled. -Continue metoprolol BID  -Start hydrochlorothiazide; hypertension could be cause by pain, so this may be discontinued if blood pressure is well controlled   Anemia Chronic.  Baseline of around 9. Likely related to chronic disease from CKD. Some acute anemia secondary to blood loss from surgery, but patient is asymptomatic. -Transfuse for hemoglobin <7  Acute kidney injury on CKD stage III Baseline creatinine of 1.3-1.5. 2.8 on admission, down to 1.55 with IV fluids -Oral hydration  Diabetes mellitus, insulin dependent Blood sugar continues to be elevated. Takes Levemir 80 units at home -Increase to Levemir 80 units qhs -Continue SSI moderate scale  Ankle pain In setting of recent fall. -Right ankle x-ray  ?UTI Patient without current urinalysis. Not complaining of symptoms.. Discontinued ceftriaxone   DVT prophylaxis: SCDs Code Status:   Code Status: DNR Family Communication: None at bedside Disposition Plan: Discharge pending orthopedic surgery recommendations/management   Consultants:   Orthopedic surgery  Procedures:   None  Antimicrobials:  Ceftriaxone  Cefazolin     Subjective: Patient with some thigh pain today.  There is some issue with dorsiflexing yesterday which is improved today.  Objective: Vitals:   04/12/18 0650 04/12/18 1338 04/12/18 2143 04/13/18 0557  BP: (!) 146/62 (!) 186/70 (!) 170/61 (!) 162/64  Pulse: 72 83 85 74  Resp: 13 16 16 16   Temp: 98.9 F (37.2 C) 98.3 F (36.8 C) 98.3 F (36.8 C) 98.5 F (36.9 C)  TempSrc:   Oral Oral  SpO2: 97% 100% 96% 100%  Weight:      Height:        Intake/Output Summary (Last 24 hours) at 04/13/2018 0722 Last data filed at 04/13/2018 0615 Gross per 24 hour  Intake 100 ml  Output 1300 ml  Net -1200 ml   Filed Weights   04/07/18 2343  Weight: 69.9 kg (154 lb)    Examination:  General exam: Appears calm and comfortable Respiratory system: Clear to auscultation. Respiratory effort normal. Cardiovascular system: S1 & S2 heard, RRR. Faint systolic murmur Gastrointestinal system: Abdomen is nondistended, soft and nontender. No organomegaly or masses felt. Normal bowel sounds heard. Central nervous system: Alert and oriented. No focal neurological deficits. Extremities: No edema. No calf tenderness. Right medial malleolus tenderness. Skin: No cyanosis. No rashes Psychiatry: Judgement and insight appear normal. Mood & affect appropriate.     Data Reviewed: I have personally reviewed following labs and imaging studies  CBC: Recent Labs  Lab 04/08/18 0056 04/10/18 0904 04/11/18 0607 04/12/18 0536 04/13/18 0554  WBC 9.5 7.6 6.6 8.2 8.3  HGB 10.1* 9.0* 7.6* 7.6* 7.7*  HCT 31.0* 27.4* 23.9* 22.6* 23.3*  MCV 92.8 91.9 90.2 90.4 92.1  PLT 197 166 176 199 226  Basic Metabolic Panel: Recent Labs  Lab 04/08/18 0056 04/10/18 0904 04/11/18 0607 04/12/18 0536 04/13/18 0554  NA 138 136 134* 136 140  K 3.8 4.2 4.7 4.4 3.7  CL 103 104 105 105 108  CO2 22 22 20* 24 24  GLUCOSE 332* 248* 411* 317* 207*  BUN 46* 40* 34* 39* 31*  CREATININE 2.80* 1.95* 1.68* 1.55* 1.51*  CALCIUM  9.0 9.4 9.0 9.2 9.4   GFR: Estimated Creatinine Clearance: 34.8 mL/min (A) (by C-G formula based on SCr of 1.51 mg/dL (H)). Liver Function Tests: No results for input(s): AST, ALT, ALKPHOS, BILITOT, PROT, ALBUMIN in the last 168 hours. No results for input(s): LIPASE, AMYLASE in the last 168 hours. No results for input(s): AMMONIA in the last 168 hours. Coagulation Profile: No results for input(s): INR, PROTIME in the last 168 hours. Cardiac Enzymes: No results for input(s): CKTOTAL, CKMB, CKMBINDEX, TROPONINI in the last 168 hours. BNP (last 3 results) No results for input(s): PROBNP in the last 8760 hours. HbA1C: No results for input(s): HGBA1C in the last 72 hours. CBG: Recent Labs  Lab 04/11/18 2206 04/12/18 0740 04/12/18 1212 04/12/18 1627 04/12/18 2144  GLUCAP 346* 272* 246* 271* 313*   Lipid Profile: No results for input(s): CHOL, HDL, LDLCALC, TRIG, CHOLHDL, LDLDIRECT in the last 72 hours. Thyroid Function Tests: No results for input(s): TSH, T4TOTAL, FREET4, T3FREE, THYROIDAB in the last 72 hours. Anemia Panel: No results for input(s): VITAMINB12, FOLATE, FERRITIN, TIBC, IRON, RETICCTPCT in the last 72 hours. Sepsis Labs: No results for input(s): PROCALCITON, LATICACIDVEN in the last 168 hours.  Recent Results (from the past 240 hour(s))  Surgical PCR screen     Status: None   Collection Time: 04/08/18  6:28 AM  Result Value Ref Range Status   MRSA, PCR NEGATIVE NEGATIVE Final   Staphylococcus aureus NEGATIVE NEGATIVE Final    Comment: (NOTE) The Xpert SA Assay (FDA approved for NASAL specimens in patients 66 years of age and older), is one component of a comprehensive surveillance program. It is not intended to diagnose infection nor to guide or monitor treatment. Performed at [facility], [place] [address] [place], [place] [address]          Radiology Studies: No results found.      Scheduled Meds: . allopurinol  50 mg Oral  Daily  . cholecalciferol  2,000 Units Oral Daily  . citalopram  20 mg Oral Daily  . cyanocobalamin  1,000 mcg Intramuscular Q30 days  . docusate sodium  100 mg Oral BID  . enoxaparin (LOVENOX) injection  30 mg Subcutaneous Q24H  . ferrous sulfate  325 mg Oral TID PC  . hydrochlorothiazide  12.5 mg Oral Daily  . insulin aspart  0-15 Units Subcutaneous TID WC  . insulin detemir  80 Units Subcutaneous QHS  . metoprolol tartrate  100 mg Oral BID  . pantoprazole  80 mg Oral Q1200   Continuous Infusions: . cefTRIAXone (ROCEPHIN)  IV Stopped (04/12/18 2156)     LOS: 6 days     Cordelia Poche, MD [facility] 04/13/2018, 7:22 AM Pager: [phone]  If 7PM-7AM, please contact night-coverage [email or web address] 04/13/2018, 7:22 AM

## 2018-04-14 DIAGNOSIS — S72001A Fracture of unspecified part of neck of right femur, initial encounter for closed fracture: Secondary | ICD-10-CM

## 2018-04-14 DIAGNOSIS — R112 Nausea with vomiting, unspecified: Secondary | ICD-10-CM

## 2018-04-14 LAB — GLUCOSE, CAPILLARY
Glucose-Capillary: 106 mg/dL — ABNORMAL HIGH (ref 65–99)
Glucose-Capillary: 253 mg/dL — ABNORMAL HIGH (ref 65–99)

## 2018-04-14 MED ORDER — OXYCODONE HCL 5 MG PO TABS: 5.0000 mg | ORAL_TABLET | Freq: Four times a day (QID) | ORAL | 0 refills | Status: DC | PRN

## 2018-04-14 MED ORDER — FERROUS SULFATE 325 (65 FE) MG PO TABS: 325.0000 mg | ORAL_TABLET | Freq: Three times a day (TID) | ORAL | 3 refills | Status: DC

## 2018-04-14 MED ORDER — DIAZEPAM 5 MG PO TABS: 5.0000 mg | ORAL_TABLET | Freq: Two times a day (BID) | ORAL | 0 refills | Status: DC | PRN

## 2018-04-14 MED ORDER — HYDROCHLOROTHIAZIDE 12.5 MG PO CAPS: 12.5000 mg | ORAL_CAPSULE | Freq: Every day | ORAL | Status: DC

## 2018-04-14 MED ORDER — DOCUSATE SODIUM 100 MG PO CAPS: 100.0000 mg | ORAL_CAPSULE | Freq: Two times a day (BID) | ORAL | 0 refills | Status: DC

## 2018-04-14 MED ORDER — OXYCODONE HCL 5 MG PO TABS
5.0000 mg | ORAL_TABLET | Freq: Four times a day (QID) | ORAL | Status: DC | PRN
Start: 2018-04-14 — End: 2018-04-14
  Administered 2018-04-14 (×2): 10 mg via ORAL
  Filled 2018-04-14: qty 1
  Filled 2018-04-14: qty 2

## 2018-04-14 NOTE — Clinical Social Work Placement (Signed)
   CLINICAL SOCIAL WORK PLACEMENT  NOTE  Date:  04/14/2018  Patient Details  Name: Heather [name] MRN: [id number] Date of Birth: 1950-12-03  Clinical Social Work is seeking post-discharge placement for this patient at the [place] level of care (*CSW will initial, date and re-position this form in  chart as items are completed):  Yes   Patient/family provided with [facility] Work Department's list of facilities offering this level of care within the geographic area requested by the patient (or if unable, by the patient's family).  Yes   Patient/family informed of their freedom to choose among providers that offer the needed level of care, that participate in Medicare, Medicaid or managed care program needed by the patient, have an available bed and are willing to accept the patient.  Yes   Patient/family informed of [facility]'s ownership interest in [facility] and [facility], as well as of the fact that they are under no obligation to receive care at these facilities.  PASRR submitted to EDS on       PASRR number received on       Existing PASRR number confirmed on    04/14/2018   FL2 transmitted to all facilities in geographic area requested by pt/family on       FL2 transmitted to all facilities within larger geographic area on 04/14/18     Patient informed that his/her managed care company has contracts with or will negotiate with certain facilities, including the following:        Yes   Patient/family informed of bed offers received.  Patient chooses bed at   [facility]   Physician recommends and patient chooses bed at     [facility]  Patient to be transferred to   on 04/14/18.  Patient to be transferred to facility by    [facility]  Patient family notified on 04/14/18 of transfer.  Name of family member notified:  Spouse-Librizzi Sr,James      PHYSICIAN       Additional Comment:     _______________________________________________ Lia Hopping, [place] 04/14/2018, 10:40 AM

## 2018-04-14 NOTE — Progress Notes (Signed)
RN called report to [facility] facility. All questions answered.   Patient toileted, and helped to stand and pivot onto the [facility] stretcher.   Packet sent with [facility] to SNF.   [facility] and SNF facility informed of mobility precautions, last pain med, vital signs, medications as well as weight bearing restrictions.   All other facility questions answered.

## 2018-04-14 NOTE — Discharge Summary (Signed)
Physician Discharge Summary  Heather [name] LOV:[id number] DOB: 1951-01-31 DOA: 04/07/2018  PCP: [name], MD  Admit date: 04/07/2018 Discharge date: 04/14/2018  Admitted From: Home Disposition: SNF  Recommendations for Outpatient Follow-up:  1. Follow up with PCP in 1 week 2. Follow up with orthopedic surgery in 2 weeks  3. Please obtain BMP/CBC in one week 4. Please follow up on the following pending results: None  Home Health: SNF Equipment/Devices: SNF  Discharge Condition: Stable CODE STATUS: DNR Diet recommendation: Heart healthy   Brief/Interim Summary:  Admission HPI written by Harvie Bridge, DO   Chief Complaint: R hip pain  HPI: Heather [name] is a 67 y.o. female with medical history significant of CKD stage 3 due to DM2, HTN, chronic N/V. Previous R femur fx s/p repair.  Patient presents to the ED at [facility] after mechanical fall.  R hip pain, deformity, inability to ambulate following fall which occurred just PTA.  Pain severe, constant, worse with movement.   ED Course: R hip fx.  Dr. Alvan Dame called in consult and will see pt for probable surgery in AM.  Hospitalist asked to admit.   Hospital course:  Right hip fracture S/p intramedullary nail. Doing well. Some right lower thigh pain. Weight bearing as tolerated. Discharge to SNF. Outpatient orthopedic surgery follow-up.  Essential hypertension Poorly controlled. Continued metoprolol. Started hydrochlorothiazide.   Anemia Chronic.  Baseline of around 9. Likely related to chronic disease from CKD. Some acute anemia secondary to blood loss from surgery, but patient is asymptomatic. Iron supplementation. Stable on discharge.  Acute kidney injury on CKD stage III Baseline creatinine of 1.3-1.5. 2.8 on admission, down to 1.55 with IV fluids.  Diabetes mellitus, insulin dependent Blood sugar continues to be elevated. Takes Levemir 80 units at home. Continued. Sliding scale  insulin.  Ankle pain In setting of recent fall. -Right ankle x-ray  UTI Treated with ceftriaxone   Discharge Diagnoses:  Principal Problem:   Closed right hip fracture (HCC) Active Problems:   Chronic Nausea and Vomiting - ? gastroapresis   Essential hypertension   Diabetes mellitus with chronic kidney disease [facility])    Discharge Instructions  Discharge Instructions    Diet - low sodium heart healthy   Complete by:  As directed    Increase activity slowly   Complete by:  As directed      Allergies as of 04/14/2018      Reactions   Duloxetine Hcl Swelling   Fluocinolone Swelling   Acetaminophen Itching, Swelling   Pt states "some swelling"   Amlodipine Besylate Swelling   Swelling of feet, fluid retention   Ciprofloxacin Nausea And Vomiting, Swelling   Swelling of face, jaw, and lips   Hydrocodone-acetaminophen Itching   Metformin Other (See Comments)   REACTION: GI UPSET   Nsaids Other (See Comments)   "HAS BLEEDING ULCERS"   Quinolones Swelling   Sulfamethoxazole Swelling   Swelling of feet, legs   Sulfonamide Derivatives Swelling   Valsartan Swelling   Cozaar [losartan] Swelling, Rash   Lisinopril Swelling, Rash   Oral swelling, and red streaks on arms/stomach      Medication List    STOP taking these medications   cefUROXime 250 MG tablet Commonly known as:  CEFTIN   colesevelam 625 MG tablet Commonly known as:  WELCHOL   dicyclomine 10 MG capsule Commonly known as:  BENTYL   diphenoxylate-atropine 2.5-0.025 MG tablet Commonly known as:  LOMOTIL   gabapentin 100 MG capsule Commonly  known as:  NEURONTIN   loperamide 2 MG capsule Commonly known as:  IMODIUM   ondansetron 4 MG tablet Commonly known as:  ZOFRAN   ondansetron 8 MG disintegrating tablet Commonly known as:  ZOFRAN ODT   predniSONE 20 MG tablet Commonly known as:  DELTASONE     TAKE these medications   allopurinol 100 MG tablet Commonly known as:  ZYLOPRIM Take 0.5  tablets (50 mg total) by mouth daily.   alosetron 0.5 MG tablet Commonly known as:  LOTRONEX Take 0.5 mg by mouth 2 (two) times daily.   aspirin EC 81 MG tablet Take 81 mg by mouth daily.   BD PEN NEEDLE NANO U/F 32G X 4 MM Misc Generic drug:  Insulin Pen Needle   citalopram 40 MG tablet Commonly known as:  CELEXA Take 0.5 tablets (20 mg total) by mouth daily.   cyanocobalamin 1000 MCG/ML injection Commonly known as:  (VITAMIN B-12) Inject 1,000 mcg into the muscle every 30 (thirty) days.   diazepam 5 MG tablet Commonly known as:  VALIUM Take 1 tablet (5 mg total) by mouth 2 (two) times daily as needed for anxiety.   docusate sodium 100 MG capsule Commonly known as:  COLACE Take 1 capsule (100 mg total) by mouth 2 (two) times daily.   esomeprazole 40 MG capsule Commonly known as:  NEXIUM Take 40 mg by mouth daily before breakfast.   fenofibrate 145 MG tablet Commonly known as:  TRICOR Take 145 mg by mouth daily.   ferrous sulfate 325 (65 FE) MG tablet Take 1 tablet (325 mg total) by mouth 3 (three) times daily after meals.   furosemide 40 MG tablet Commonly known as:  LASIX Take 1 tablet (40 mg total) by mouth daily.   glucose blood test strip Commonly known as:  ONE TOUCH TEST STRIPS Use as instructed 4 x daily. E11.65   HUMALOG KWIKPEN [place] Inject 0-21 Units into the skin 3 (three) times daily with meals. Per home sliding scale   hydrochlorothiazide 12.5 MG capsule Commonly known as:  MICROZIDE Take 1 capsule (12.5 mg total) by mouth daily. Start taking on:  04/15/2018   Insulin Detemir 100 UNIT/ML Pen Commonly known as:  LEVEMIR FLEXTOUCH Inject 80 Units into the skin at bedtime.   lipase/protease/amylase 36000 UNITS Cpep capsule Commonly known as:  CREON Take 2 with meals and 1 with snacks What changed:  additional instructions   liraglutide 18 MG/3ML Sopn Commonly known as:  VICTOZA USE 1.8 MG EVERY DAY AS DIRECTED   metoprolol tartrate 100 MG  tablet Commonly known as:  LOPRESSOR Take 100 mg by mouth 2 (two) times daily.   ONETOUCH VERIO w/Device Kit 1 each by Does not apply route as needed. Test 4 times daily   oxyCODONE 5 MG immediate release tablet Commonly known as:  Oxy IR/ROXICODONE Take 1-2 tablets (5-10 mg total) by mouth every 6 (six) hours as needed for severe pain.   potassium chloride SA 20 MEQ tablet Commonly known as:  K-DUR,KLOR-CON Take 20 mEq by mouth daily.   Vitamin D 2000 units Caps Take 2,000 Units by mouth daily.       Contact information for follow-up providers    Paralee Cancel, MD. Schedule an appointment as soon as possible for a visit in 2 weeks.   Specialty:  Orthopedic Surgery Contact information: [address] [place] [address] [phone]            Contact information for after-discharge care  [facility] .   Service:  Skilled Nursing Contact information: [address] [place] [phone]                 Allergies  Allergen Reactions  . Duloxetine Hcl Swelling  . Fluocinolone Swelling  . Acetaminophen Itching and Swelling    Pt states "some swelling"  . Amlodipine Besylate Swelling    Swelling of feet, fluid retention  . Ciprofloxacin Nausea And Vomiting and Swelling    Swelling of face, jaw, and lips  . Hydrocodone-Acetaminophen Itching  . Metformin Other (See Comments)    REACTION: GI UPSET  . Nsaids Other (See Comments)    "HAS BLEEDING ULCERS"  . Quinolones Swelling  . Sulfamethoxazole Swelling    Swelling of feet, legs  . Sulfonamide Derivatives Swelling  . Valsartan Swelling  . Cozaar [Losartan] Swelling and Rash  . Lisinopril Swelling and Rash    Oral swelling, and red streaks on arms/stomach    Consultations:  Orthopedic surgery   Procedures/Studies: Dg Knee 1-2 Views Right  Result Date: 04/10/2018 CLINICAL DATA:  Hardware  removal. EXAM: RIGHT KNEE - 1-2 VIEW; OPERATIVE RIGHT HIP WITH PELVIS COMPARISON:  04/08/2018 and 03/30/2017 FINDINGS: Evidence of patient's right total knee arthroplasty intact. Interval removal of right femoral intramedullary nail. Placement of new proximal right femoral intramedullary nail with associated screw bridging the femoral neck into the femoral head fixating patient's femoral neck fracture. Hardware is intact with anatomic alignment over the fracture site. IMPRESSION: Fixation patient's right femoral neck fracture with hardware intact and anatomic alignment over the fracture site. Electronically Signed   By: Marin Olp M.D.   On: 04/10/2018 21:36   Dg Ankle Complete Right  Result Date: 04/13/2018 CLINICAL DATA:  Pt c/o medial and lateral right ankle pain and swelling post right hip replacement. EXAM: RIGHT ANKLE - COMPLETE 3+ VIEW COMPARISON:  07/02/2011 FINDINGS: Mild bimalleolar soft tissue swelling. Osteopenia. Mild-to-moderate tibiotalar osteoarthritis. No acute fracture or dislocation. Vascular calcifications. IMPRESSION: No acute osseous abnormality. Electronically Signed   By: Abigail Miyamoto M.D.   On: 04/13/2018 08:59   Dg Pelvis Portable  Result Date: 04/08/2018 CLINICAL DATA:  Closed right hip fracture EXAM: PORTABLE PELVIS 1-2 VIEWS COMPARISON:  CT abdomen pelvis 01/08/2018 FINDINGS: Intertrochanteric fracture right femur with mild displacement and angulation. Mild degenerative change in the right hip joint. Locking intramedullary rod in the right femur. No other pelvic fracture IMPRESSION: Intertrochanteric fracture right femur Electronically Signed   By: Franchot Gallo M.D.   On: 04/08/2018 09:15   Dg C-arm 1-60 Min-no Report  Result Date: 04/10/2018 Fluoroscopy was utilized by the requesting physician.  No radiographic interpretation.   Dg Hip Operative Unilat With Pelvis Right  Result Date: 04/10/2018 CLINICAL DATA:  Hardware removal. EXAM: RIGHT KNEE - 1-2 VIEW; OPERATIVE  RIGHT HIP WITH PELVIS COMPARISON:  04/08/2018 and 03/30/2017 FINDINGS: Evidence of patient's right total knee arthroplasty intact. Interval removal of right femoral intramedullary nail. Placement of new proximal right femoral intramedullary nail with associated screw bridging the femoral neck into the femoral head fixating patient's femoral neck fracture. Hardware is intact with anatomic alignment over the fracture site. IMPRESSION: Fixation patient's right femoral neck fracture with hardware intact and anatomic alignment over the fracture site. Electronically Signed   By: Marin Olp M.D.   On: 04/10/2018 21:36   Dg Femur, Min 2 Views Right  Result Date: 04/08/2018 CLINICAL DATA:  Closed hip fracture right preop EXAM: RIGHT FEMUR 2 VIEWS COMPARISON:  04/08/2018.  03/31/2017 FINDINGS: Right knee replacement. Locking intramedullary rod in the right femur. Intertrochanteric fracture right femur with mild displacement and angulation. Atherosclerotic disease IMPRESSION: Intertrochanteric fracture right femur Electronically Signed   By: Franchot Gallo M.D.   On: 04/08/2018 09:17      Subjective: Pain is improved. Some improvement in right ankle pain.  Discharge Exam: Vitals:   04/13/18 2215 04/14/18 0541  BP: (!) 148/52 (!) 153/56  Pulse: 82 76  Resp: 17 16  Temp: 99.5 F (37.5 C) 98.7 F (37.1 C)  SpO2: 98% 100%   Vitals:   04/13/18 0557 04/13/18 1350 04/13/18 2215 04/14/18 0541  BP: (!) 162/64 (!) 153/61 (!) 148/52 (!) 153/56  Pulse: 74 67 82 76  Resp: '16 16 17 16  '$ Temp: 98.5 F (36.9 C) 98.3 F (36.8 C) 99.5 F (37.5 C) 98.7 F (37.1 C)  TempSrc: Oral Oral Oral Oral  SpO2: 100% 100% 98% 100%  Weight:      Height:        General: Pt is alert, awake, not in acute distress Cardiovascular: RRR, S1/S2 +, no rubs, no gallops Respiratory: CTA bilaterally, no wheezing, no rhonchi Abdominal: Soft, NT, ND, bowel sounds + Extremities: no edema, no cyanosis.   The results of  significant diagnostics from this hospitalization (including imaging, microbiology, ancillary and laboratory) are listed below for reference.     Microbiology: Recent Results (from the past 240 hour(s))  Surgical PCR screen     Status: None   Collection Time: 04/08/18  6:28 AM  Result Value Ref Range Status   MRSA, PCR NEGATIVE NEGATIVE Final   Staphylococcus aureus NEGATIVE NEGATIVE Final    Comment: (NOTE) The Xpert SA Assay (FDA approved for NASAL specimens in patients 51 years of age and older), is one component of a comprehensive surveillance program. It is not intended to diagnose infection nor to guide or monitor treatment. Performed at [facility], [place] [address], [place], [place] [address]      Labs: BNP (last 3 results) No results for input(s): BNP in the last 8760 hours. Basic Metabolic Panel: Recent Labs  Lab 04/08/18 0056 04/10/18 0904 04/11/18 0607 04/12/18 0536 04/13/18 0554  NA 138 136 134* 136 140  K 3.8 4.2 4.7 4.4 3.7  CL 103 104 105 105 108  CO2 22 22 20* 24 24  GLUCOSE 332* 248* 411* 317* 207*  BUN 46* 40* 34* 39* 31*  CREATININE 2.80* 1.95* 1.68* 1.55* 1.51*  CALCIUM 9.0 9.4 9.0 9.2 9.4   Liver Function Tests: No results for input(s): AST, ALT, ALKPHOS, BILITOT, PROT, ALBUMIN in the last 168 hours. No results for input(s): LIPASE, AMYLASE in the last 168 hours. No results for input(s): AMMONIA in the last 168 hours. CBC: Recent Labs  Lab 04/08/18 0056 04/10/18 0904 04/11/18 0607 04/12/18 0536 04/13/18 0554  WBC 9.5 7.6 6.6 8.2 8.3  HGB 10.1* 9.0* 7.6* 7.6* 7.7*  HCT 31.0* 27.4* 23.9* 22.6* 23.3*  MCV 92.8 91.9 90.2 90.4 92.1  PLT 197 166 176 199 226   Cardiac Enzymes: No results for input(s): CKTOTAL, CKMB, CKMBINDEX, TROPONINI in the last 168 hours. BNP: Invalid input(s): POCBNP CBG: Recent Labs  Lab 04/13/18 1219 04/13/18 1721 04/13/18 2217 04/14/18 0743 04/14/18 1231  GLUCAP 196* 215* 193* 106* 253*    Urinalysis    Component Value Date/Time   COLORURINE Orange (A) 03/28/2018 1224   APPEARANCEUR  Sl Cloudy (A) 03/28/2018 1224   LABSPEC <=1.005 (A) 03/28/2018 1224   PHURINE 5.0 03/28/2018 1224   GLUCOSEU NEGATIVE 03/28/2018 1224   HGBUR MODERATE (A) 03/28/2018 1224   BILIRUBINUR NEGATIVE 03/28/2018 1224   KETONESUR NEGATIVE 03/28/2018 1224   PROTEINUR NEGATIVE 03/05/2018 1359   UROBILINOGEN 1.0 03/28/2018 1224   NITRITE POSITIVE (A) 03/28/2018 1224   LEUKOCYTESUR LARGE (A) 03/28/2018 1224   Sepsis Labs Invalid input(s): PROCALCITONIN,  WBC,  LACTICIDVEN Microbiology Recent Results (from the past 240 hour(s))  Surgical PCR screen     Status: None   Collection Time: 04/08/18  6:28 AM  Result Value Ref Range Status   MRSA, PCR NEGATIVE NEGATIVE Final   Staphylococcus aureus NEGATIVE NEGATIVE Final    Comment: (NOTE) The Xpert SA Assay (FDA approved for NASAL specimens in patients 69 years of age and older), is one component of a comprehensive surveillance program. It is not intended to diagnose infection nor to guide or monitor treatment. Performed at [facility], [place] [address] [place], [place] [address]     SIGNED:   Cordelia Poche, MD [facility] 04/14/2018, 1:06 PM

## 2018-04-28 ENCOUNTER — Ambulatory Visit: Payer: Medicare Other | Admitting: Internal Medicine

## 2018-05-05 ENCOUNTER — Telehealth: Payer: Self-pay

## 2018-05-05 NOTE — Telephone Encounter (Signed)
-----   Message from Gatha Mayer, MD sent at 05/03/2018  3:20 PM EDT ----- Regarding: f/u diarrhea Please contact patient  Was reviewing PA visit 5/31 and saw that she was unable to complete stool studies - had a fracture and surgery  Please get a GI sx update and see if she needs f/u or other advice

## 2018-05-05 NOTE — Telephone Encounter (Signed)
Patient c/o constipation now.  She had 5 doses of Miralax yesterday with no results.  She was d/c from hospital again on Friday.  She is minimizing her pain meds and usually only taking for sleep. She is requesting something to help relieve her constipation.

## 2018-05-05 NOTE — Telephone Encounter (Signed)
Left message for patient to call back  

## 2018-05-05 NOTE — Telephone Encounter (Signed)
I was not able to reach the patient .  I left her a detailed message with instructions.

## 2018-05-05 NOTE — Telephone Encounter (Signed)
She can try citrate of magnesia 1/2-1 bottle  Will need regular MiraLax on pain meds  She should make a f/u me if not set up

## 2018-05-05 NOTE — Telephone Encounter (Signed)
Pt returned call

## 2018-06-10 ENCOUNTER — Telehealth: Payer: Self-pay | Admitting: Internal Medicine

## 2018-06-10 MED ORDER — ONDANSETRON HCL 4 MG PO TABS: 4.0000 mg | ORAL_TABLET | Freq: Three times a day (TID) | ORAL | 1 refills | Status: DC | PRN

## 2018-06-10 NOTE — Telephone Encounter (Signed)
Pt calling for a refill on her zofran prescription. Refill sent to pharmacy for pt.

## 2018-06-12 ENCOUNTER — Other Ambulatory Visit: Payer: Self-pay

## 2018-06-12 ENCOUNTER — Telehealth: Payer: Self-pay | Admitting: Internal Medicine

## 2018-06-12 NOTE — Telephone Encounter (Signed)
Patient just wanted to verify that the zofran is also prescribed for diarrhea, like what she was on last time. Reassured her that it is the same medication, must not have had for diarrhea use labled.

## 2018-06-15 ENCOUNTER — Emergency Department (HOSPITAL_COMMUNITY)
Admission: EM | Admit: 2018-06-15 | Discharge: 2018-06-15 | Disposition: A | Discharge status: Home / Self Care | Payer: Medicare Other | Attending: Emergency Medicine | Admitting: Emergency Medicine

## 2018-06-15 ENCOUNTER — Other Ambulatory Visit: Payer: Self-pay

## 2018-06-15 ENCOUNTER — Encounter (HOSPITAL_COMMUNITY): Payer: Self-pay | Admitting: *Deleted

## 2018-06-15 ENCOUNTER — Emergency Department (HOSPITAL_COMMUNITY): Payer: Medicare Other

## 2018-06-15 DIAGNOSIS — G8929 Other chronic pain: Secondary | ICD-10-CM | POA: Diagnosis not present

## 2018-06-15 DIAGNOSIS — R609 Edema, unspecified: Secondary | ICD-10-CM

## 2018-06-15 DIAGNOSIS — N183 Chronic kidney disease, stage 3 unspecified: Secondary | ICD-10-CM

## 2018-06-15 DIAGNOSIS — R197 Diarrhea, unspecified: Secondary | ICD-10-CM

## 2018-06-15 DIAGNOSIS — R2243 Localized swelling, mass and lump, lower limb, bilateral: Secondary | ICD-10-CM | POA: Diagnosis present

## 2018-06-15 DIAGNOSIS — Z794 Long term (current) use of insulin: Secondary | ICD-10-CM | POA: Insufficient documentation

## 2018-06-15 DIAGNOSIS — M25561 Pain in right knee: Secondary | ICD-10-CM | POA: Diagnosis not present

## 2018-06-15 DIAGNOSIS — R600 Localized edema: Secondary | ICD-10-CM

## 2018-06-15 DIAGNOSIS — M25551 Pain in right hip: Secondary | ICD-10-CM

## 2018-06-15 DIAGNOSIS — I129 Hypertensive chronic kidney disease with stage 1 through stage 4 chronic kidney disease, or unspecified chronic kidney disease: Secondary | ICD-10-CM | POA: Diagnosis not present

## 2018-06-15 DIAGNOSIS — N3000 Acute cystitis without hematuria: Secondary | ICD-10-CM

## 2018-06-15 DIAGNOSIS — Z79899 Other long term (current) drug therapy: Secondary | ICD-10-CM | POA: Diagnosis not present

## 2018-06-15 DIAGNOSIS — E1122 Type 2 diabetes mellitus with diabetic chronic kidney disease: Secondary | ICD-10-CM | POA: Diagnosis not present

## 2018-06-15 LAB — COMPREHENSIVE METABOLIC PANEL
ALT: 12 U/L (ref 0–44)
ANION GAP: 7 (ref 5–15)
AST: 16 U/L (ref 15–41)
Albumin: 3.7 g/dL (ref 3.5–5.0)
Alkaline Phosphatase: 102 U/L (ref 38–126)
BUN: 35 mg/dL — ABNORMAL HIGH (ref 8–23)
CHLORIDE: 106 mmol/L (ref 98–111)
CO2: 26 mmol/L (ref 22–32)
Calcium: 9.2 mg/dL (ref 8.9–10.3)
Creatinine, Ser: 1.71 mg/dL — ABNORMAL HIGH (ref 0.44–1.00)
GFR calc non Af Amer: 30 mL/min — ABNORMAL LOW (ref >60)
GFR, EST AFRICAN AMERICAN: 35 mL/min — AB (ref >60)
Glucose, Bld: 233 mg/dL — ABNORMAL HIGH (ref 70–99)
Potassium: 4.2 mmol/L (ref 3.5–5.1)
SODIUM: 139 mmol/L (ref 135–145)
Total Bilirubin: 0.6 mg/dL (ref 0.3–1.2)
Total Protein: 6.9 g/dL (ref 6.5–8.1)

## 2018-06-15 LAB — URINALYSIS, ROUTINE W REFLEX MICROSCOPIC
Bacteria, UA: NONE SEEN
Bilirubin Urine: NEGATIVE
GLUCOSE, UA: NEGATIVE mg/dL
Hgb urine dipstick: NEGATIVE
Ketones, ur: NEGATIVE mg/dL
NITRITE: NEGATIVE
PROTEIN: NEGATIVE mg/dL
SPECIFIC GRAVITY, URINE: 1.012 (ref 1.005–1.030)
WBC, UA: >50 WBC/hpf — ABNORMAL HIGH (ref 0–5)
pH: 5 (ref 5.0–8.0)

## 2018-06-15 LAB — CBC
HCT: 33.6 % — ABNORMAL LOW (ref 36.0–46.0)
HEMOGLOBIN: 10.6 g/dL — AB (ref 12.0–15.0)
MCH: 29.3 pg (ref 26.0–34.0)
MCHC: 31.5 g/dL (ref 30.0–36.0)
MCV: 92.8 fL (ref 78.0–100.0)
PLATELETS: 211 10*3/uL (ref 150–400)
RBC: 3.62 MIL/uL — AB (ref 3.87–5.11)
RDW: 15.3 % (ref 11.5–15.5)
WBC: 8.6 10*3/uL (ref 4.0–10.5)

## 2018-06-15 LAB — LIPASE, BLOOD: Lipase: 53 U/L — ABNORMAL HIGH (ref 11–51)

## 2018-06-15 MED ORDER — MORPHINE SULFATE (PF) 4 MG/ML IV SOLN
4.0000 mg | Freq: Once | INTRAVENOUS | Status: AC
  Administered 2018-06-15: 4 mg via INTRAVENOUS

## 2018-06-15 MED ORDER — MORPHINE SULFATE (PF) 4 MG/ML IV SOLN
4.0000 mg | Freq: Once | INTRAVENOUS | Status: DC
  Filled 2018-06-15: qty 1

## 2018-06-15 MED ORDER — ONDANSETRON HCL 4 MG/2ML IJ SOLN
4.0000 mg | Freq: Once | INTRAMUSCULAR | Status: AC
  Administered 2018-06-15: 4 mg via INTRAVENOUS
  Filled 2018-06-15: qty 2

## 2018-06-15 MED ORDER — CEPHALEXIN 500 MG PO CAPS: 500.0000 mg | ORAL_CAPSULE | Freq: Four times a day (QID) | ORAL | 0 refills | Status: DC

## 2018-06-15 MED ORDER — PHENAZOPYRIDINE HCL 100 MG PO TABS
200.0000 mg | ORAL_TABLET | Freq: Once | ORAL | Status: AC
  Administered 2018-06-15: 200 mg via ORAL
  Filled 2018-06-15: qty 2

## 2018-06-15 MED ORDER — OXYCODONE-ACETAMINOPHEN 5-325 MG PO TABS: 1.0000 | ORAL_TABLET | ORAL | 0 refills | Status: DC | PRN

## 2018-06-15 MED ORDER — CEPHALEXIN 500 MG PO CAPS
500.0000 mg | ORAL_CAPSULE | Freq: Once | ORAL | Status: AC
  Administered 2018-06-15: 500 mg via ORAL
  Filled 2018-06-15: qty 1

## 2018-06-15 NOTE — Discharge Instructions (Addendum)
Increase lasix to BID for 3 days.  Also wear compression hose.

## 2018-06-15 NOTE — ED Notes (Signed)
Pt in restroom when called to be brought back to room. Will call again in a few minutes.

## 2018-06-15 NOTE — ED Triage Notes (Signed)
Pt c/o right leg and foot swelling x 2 days. Pt had right hip and right knee surgery in on 04/09/18. Pt also c/o vomiting and diarrhea that started yesterday. Pt reports cramping abdominal pain.

## 2018-06-15 NOTE — ED Provider Notes (Signed)
[facility] EMERGENCY DEPARTMENT Provider Note   CSN: [id number] Arrival date & time: 06/15/18  1724     History   Chief Complaint Chief Complaint  Patient presents with  . Leg Swelling  . Abdominal Pain    HPI Heather [name] is a 67 y.o. female.  Pt presents to the ED today with bilateral leg swelling as well as abdominal cramping and diarrhea.  The pt also c/o right hip and knee pain.  Pt fell and broke her hip and required an IM nail by Dr. Alvan Dame.  She was admitted from 6/10-17.  She was constipated while on pain meds and took miralax.  She is now having diarrhea.  Pt also said she's had pain in her right hip and knee since the surgery.  She has not told Dr. Alvan Dame this.  The pt did call her GI doctor for her abd sx and was given rx for zofran.  Pt is not feeling any better.     Past Medical History:  Diagnosis Date  . Acute renal failure superimposed on stage 3 chronic kidney disease ([place]) 01/21/2016  . B12 deficiency   . Cellulitis and abscess    Abdomen and buttocks  . CKD (chronic kidney disease) stage 3, GFR 30-59 ml/min (HCC)   . Closed fracture of distal end of femur, unspecified fracture morphology, initial encounter ([place])   . Depression   . Diabetic neuropathy ([place])   . DJD (degenerative joint disease)    Right forminal stenosis C4-5  . Essential hypertension   . Femur fracture, right ([place]) 03/30/2017  . Folliculitis   . Gout   . Headache(784.0)   . Hiatal hernia   . History of bronchitis   . History of cardiac catheterization    No significant CAD 2012  . Insomnia   . Irritable bowel syndrome   . Mixed hyperlipidemia   . Osteoarthritis   . Palpitations   . Reflux esophagitis   . Tinnitus    Right  . Type 2 diabetes mellitus [facility])     Patient Active Problem List   Diagnosis Date Noted  . Closed right hip fracture ([place]) 04/08/2018  . Salmonella   . Elevated lipase   . Gastroenteritis   . Protein-calorie malnutrition, severe 03/05/2018  . AKI  (acute kidney injury) ([place]) 03/04/2018  . Neuropathy 03/02/2015  . Pain in the chest   . Chest pain 12/22/2014  . Essential hypertension 12/22/2014  . Diabetes mellitus with chronic kidney disease ([place]) 12/22/2014  . Hiatal hernia 12/22/2014  . GERD (gastroesophageal reflux disease) 12/22/2014  . Gout 12/22/2014  . Mixed hyperlipidemia 12/22/2014  . Depression 12/22/2014  . Chronic kidney disease, stage III (moderate) ([place]) 08/25/2014  . Vitamin B12 deficiency 07/08/2011  . Personal history of failed moderate sedation 04/16/2011  . Chronic Nausea and Vomiting - ? gastroapresis 04/16/2011  . Early satiety 04/16/2011  . Irritable bowel syndrome 04/16/2011  . Benign paroxysmal positional vertigo 11/20/2010  . TRICUSPID REGURGITATION, MODERATE 11/20/2010  . PULMONARY HYPERTENSION, MILD 11/20/2010  . LEG EDEMA, BILATERAL 11/20/2010  . DYSPNEA 11/20/2010  . NONSPECIFIC ABNORMAL ELECTROCARDIOGRAM 11/20/2010  . CHEST WALL PAIN, HX OF 11/20/2010    Past Surgical History:  Procedure Laterality Date  . c-spine surgery     03/2007  . CARDIAC CATHETERIZATION    . CHOLECYSTECTOMY    . COLONOSCOPY  04/21/2011; 05/29/11   6/12: [place] - ?AVM at IC valve, inflammatory changes at [facility] valve [facility]); 7/12 - [facility]; IC valve  erosions, look chronic and probably Crohn's per path  . colonoscopy  2017   [facility]: random biopsies normal. TI normal  . ESOPHAGOGASTRODUODENOSCOPY  05/29/11   normal  . ESOPHAGOGASTRODUODENOSCOPY N/A 01/21/2016   Dr. Michail Sermon: normal  . FEMUR IM NAIL Right 04/10/2018   Procedure: INTRAMEDULLARY (IM) NAIL FEMORAL;  Surgeon: Paralee Cancel, MD;  Location: [facility];  Service: Orthopedics;  Laterality: Right;  . HARDWARE REMOVAL Right 04/10/2018   Procedure: HARDWARE REMOVAL RIGHT DISTAL FEMUR;  Surgeon: Paralee Cancel, MD;  Location: [facility];  Service: Orthopedics;  Laterality: Right;  . IR FLUORO GUIDE CV LINE RIGHT  04/01/2017  . IR US GUIDE VASC ACCESS RIGHT   04/01/2017  . JOINT REPLACEMENT    . ORIF FEMUR FRACTURE Right 03/31/2017   Procedure: OPEN REDUCTION INTERNAL FIXATION (ORIF) DISTAL FEMUR FRACTURE;  Surgeon: Paralee Cancel, MD;  Location: [place];  Service: Orthopedics;  Laterality: Right;  . Right knee arthroscopic surgery    . TOTAL ABDOMINAL HYSTERECTOMY       OB History   None      Home Medications    Prior to Admission medications   Medication Sig Start Date End Date Taking? Authorizing Provider  acyclovir (ZOVIRAX) 400 MG tablet Take 400 mg by mouth 3 (three) times daily.   Yes [provider]  allopurinol (ZYLOPRIM) 100 MG tablet Take 0.5 tablets (50 mg total) by mouth daily. Patient taking differently: Take 100 mg by mouth every morning.  03/13/18  Yes Barton Dubois, MD  alosetron (LOTRONEX) 0.5 MG tablet Take 0.5 mg by mouth 2 (two) times daily.   Yes [provider]  Ascorbic Acid (VITAMIN C WITH ROSE HIPS) 500 MG tablet Take 500 mg by mouth every morning.   Yes [provider]  aspirin EC 81 MG tablet Take 81 mg by mouth every morning.    Yes [provider]  atorvastatin (LIPITOR) 80 MG tablet Take 80 mg by mouth every morning.   Yes [provider]  Cholecalciferol (VITAMIN D) 2000 units CAPS Take 2,000 Units by mouth every morning.    Yes [provider]  citalopram (CELEXA) 40 MG tablet Take 0.5 tablets (20 mg total) by mouth daily. Patient taking differently: Take 40 mg by mouth every morning.  04/03/17  Yes Rosita Fire, MD  cyanocobalamin (,VITAMIN B-12,) 1000 MCG/ML injection Inject 1,000 mcg into the muscle every 30 (thirty) days.     Yes [provider]  diazepam (VALIUM) 5 MG tablet Take 1 tablet (5 mg total) by mouth 2 (two) times daily as needed for anxiety. 04/14/18  Yes Mariel Aloe, MD  esomeprazole (NEXIUM) 40 MG capsule Take 40 mg by mouth daily before breakfast.     Yes [provider]  ferrous sulfate 325 (65 FE) MG tablet Take 1  tablet (325 mg total) by mouth 3 (three) times daily after meals. 04/14/18  Yes Mariel Aloe, MD  furosemide (LASIX) 40 MG tablet Take 1 tablet (40 mg total) by mouth daily. Patient taking differently: Take 40 mg by mouth every morning.  01/26/16  Yes Reyne Dumas, MD  Insulin Detemir (LEVEMIR FLEXTOUCH) 100 UNIT/ML Pen Inject 80 Units into the skin at bedtime. 04/03/17  Yes Rosita Fire, MD  Insulin Lispro (HUMALOG KWIKPEN [place]) Inject 1-21 Units into the skin 3 (three) times daily with meals. Per home sliding scale   Yes [provider]  lipase/protease/amylase (CREON) 36000 UNITS CPEP capsule Take 2 with meals and 1 with  snacks Patient taking differently: Take 2 with meals, 2 at bedtime and 1 with snacks 03/28/18  Yes Lemmon, Lavone Nian, PA  liraglutide (VICTOZA) 18 MG/3ML SOPN USE 1.8 MG EVERY DAY AS DIRECTED Patient taking differently: Inject 1.2 mg into the skin daily with supper.  05/30/17  Yes Cassandria Anger, MD  Melatonin 10 MG TABS Take 10 mg by mouth at bedtime.   Yes [provider]  metoprolol (LOPRESSOR) 100 MG tablet Take 100 mg by mouth 2 (two) times daily.    Yes [provider]  ondansetron (ZOFRAN) 4 MG tablet Take 1 tablet (4 mg total) by mouth every 8 (eight) hours as needed for nausea or vomiting. 06/10/18  Yes Gatha Mayer, MD  potassium chloride SA (K-DUR,KLOR-CON) 20 MEQ tablet Take 20 mEq by mouth every morning.    Yes [provider]  tiZANidine (ZANAFLEX) 4 MG tablet Take 8 mg by mouth at bedtime.   Yes [provider]  cephALEXin (KEFLEX) 500 MG capsule Take 1 capsule (500 mg total) by mouth 4 (four) times daily. 06/15/18   Isla Pence, MD  docusate sodium (COLACE) 100 MG capsule Take 1 capsule (100 mg total) by mouth 2 (two) times daily. Patient not taking: Reported on 06/15/2018 04/14/18   Mariel Aloe, MD  oxyCODONE (OXY IR/ROXICODONE) 5 MG immediate release tablet Take 1-2 tablets (5-10 mg total) by  mouth every 6 (six) hours as needed for severe pain. Patient not taking: Reported on 06/15/2018 04/14/18   Mariel Aloe, MD  oxyCODONE-acetaminophen (PERCOCET/ROXICET) 5-325 MG tablet Take 1 tablet by mouth every 4 (four) hours as needed for severe pain. 06/15/18   Isla Pence, MD    Family History Family History  Problem Relation Age of Onset  . Diabetes Mother   . Colon cancer Brother        POSSIBLY. Patient states diagnosed at age 63, then later states age 64. unclear and limited historian    Social History Social History   Tobacco Use  . Smoking status: Never Smoker  . Smokeless tobacco: Never Used  Substance Use Topics  . Alcohol use: No  . Drug use: No     Allergies   Duloxetine hcl; Fluocinolone; Acetaminophen; Amlodipine besylate; Ciprofloxacin; Hydrocodone-acetaminophen; Metformin; Nsaids; Quinolones; Sulfamethoxazole; Sulfonamide derivatives; Valsartan; Cozaar [losartan]; Lisinopril; and Tape   Review of Systems Review of Systems  Gastrointestinal: Positive for abdominal pain, diarrhea, nausea and vomiting.  Musculoskeletal:       Bilateral leg swelling  All other systems reviewed and are negative.    Physical Exam Updated Vital Signs BP (!) 204/78   Pulse 72   Temp 98.3 F (36.8 C) (Oral)   Resp 16   Ht 5\' 3"  (1.6 m)   Wt 61.2 kg   SpO2 100%   BMI 23.91 kg/m   Physical Exam  Constitutional: She is oriented to person, place, and time. She appears well-developed and well-nourished.  HENT:  Head: Normocephalic and atraumatic.  Mouth/Throat: Oropharynx is clear and moist.  Eyes: Pupils are equal, round, and reactive to light. EOM are normal.  Cardiovascular: Normal rate, regular rhythm, normal heart sounds and intact distal pulses.  Pulmonary/Chest: Effort normal and breath sounds normal.  Abdominal: Soft. Normal appearance and bowel sounds are normal. There is tenderness in the right lower quadrant, suprapubic area and left lower quadrant.    Neurological: She is alert and oriented to person, place, and time.  Skin: Skin is warm and dry. Capillary refill takes less  than 2 seconds.  Psychiatric: She has a normal mood and affect. Her behavior is normal.  Nursing note and vitals reviewed.    ED Treatments / Results  Labs (all labs ordered are listed, but only abnormal results are displayed) Labs Reviewed  LIPASE, BLOOD - Abnormal; Notable for the following components:      Result Value   Lipase 53 (*)    All other components within normal limits  COMPREHENSIVE METABOLIC PANEL - Abnormal; Notable for the following components:   Glucose, Bld 233 (*)    BUN 35 (*)    Creatinine, Ser 1.71 (*)    GFR calc non Af Amer 30 (*)    GFR calc Af Amer 35 (*)    All other components within normal limits  CBC - Abnormal; Notable for the following components:   RBC 3.62 (*)    Hemoglobin 10.6 (*)    HCT 33.6 (*)    All other components within normal limits  URINALYSIS, ROUTINE W REFLEX MICROSCOPIC - Abnormal; Notable for the following components:   APPearance CLOUDY (*)    Leukocytes, UA LARGE (*)    WBC, UA >50 (*)    Non Squamous Epithelial 0-5 (*)    All other components within normal limits  GASTROINTESTINAL PANEL BY PCR, STOOL (REPLACES STOOL CULTURE)  C DIFFICILE QUICK SCREEN W PCR REFLEX    EKG EKG Interpretation  Date/Time:  Sunday June 15 2018 18:48:54 EDT Ventricular Rate:  74 PR Interval:    QRS Duration: 92 QT Interval:  441 QTC Calculation: 490 R Axis:   79 Text Interpretation:  Sinus rhythm Borderline prolonged QT interval No significant change since last tracing Confirmed by Isla Pence [phone]) on 06/15/2018 6:51:47 PM   Radiology Dg Chest 2 View  Result Date: 06/15/2018 CLINICAL DATA:  Multiple recent falls.  Weakness. EXAM: CHEST - 2 VIEW COMPARISON:  December 04, 2017 FINDINGS: There is no edema or consolidation. Heart size and pulmonary vascularity are normal. No adenopathy. No pneumothorax.  There is postoperative change in the lower cervical and upper thoracic spine regions. IMPRESSION: No edema or consolidation. Electronically Signed   By: Lowella Grip III M.D.   On: 06/15/2018 20:38   Dg Knee Complete 4 Views Right  Result Date: 06/15/2018 CLINICAL DATA:  Pain and weakness.  Recent falls EXAM: RIGHT KNEE - COMPLETE 4+ VIEW COMPARISON:  March 30, 2017 FINDINGS: Frontal, lateral, and bilateral oblique views were obtained. Patient is status post total knee replacement with femoral and tibial prosthetic components well-seated. No acute fracture or dislocation. No joint effusion. No erosive change. Bones are osteoporotic. There is extensive arterial vascular calcification. IMPRESSION: Status post total knee replacement with prosthetic components well-seated. No fracture or dislocation. No joint effusion. Bones osteoporotic. Multifocal atherosclerotic calcification. Electronically Signed   By: Lowella Grip III M.D.   On: 06/15/2018 20:41   Dg Hip Unilat W Or Wo Pelvis 2-3 Views Right  Result Date: 06/15/2018 CLINICAL DATA:  Weakness.  Several recent falls EXAM: DG HIP (WITH OR WITHOUT PELVIS) 2-3V RIGHT COMPARISON:  Intraoperative right hip images April 10, 2018 FINDINGS: Frontal pelvis as well as frontal and lateral right hip images were obtained. There is screw and rod fixation through a prior vastly noted intertrochanteric femur fracture on the right. Alignment at the fracture site is essentially anatomic with healing. Tip of screw is in the proximal femoral head. Bones are osteoporotic. There is no evident acute fracture or dislocation. There is symmetric narrowing of each  hip joint, moderate. No erosive changes. Foci of iliac artery calcification bilaterally noted. IMPRESSION: Postoperative change right femur. Previously noted intertrochanteric femur fracture on the right is in essentially anatomic alignment and healed. No acute fracture or dislocation. Moderate symmetric narrowing of  both hip joints. Bones are osteoporotic. There is iliac artery atherosclerosis bilaterally. Electronically Signed   By: Lowella Grip III M.D.   On: 06/15/2018 20:39    Procedures Procedures (including critical care time)  Medications Ordered in ED Medications  morphine 4 MG/ML injection 4 mg (has no administration in time range)  ondansetron (ZOFRAN) injection 4 mg (has no administration in time range)  phenazopyridine (PYRIDIUM) tablet 200 mg (has no administration in time range)  cephALEXin (KEFLEX) capsule 500 mg (has no administration in time range)  morphine 4 MG/ML injection 4 mg (has no administration in time range)     Initial Impression / Assessment and Plan / ED Course  I have reviewed the triage vital signs and the nursing notes.  Pertinent labs & imaging results that were available during my care of the patient were reviewed by me and considered in my medical decision making (see chart for details).    Pt did have diarrhea here and it will be sent for cx and c.diff.  Hardware from recent surgery looks good.  The pt instructed to increase lasix to bid for 3 days and to use her compression hose for her peripheral edema.  Pt will be started on keflex for uti.  Return if worse and f/u with pcp/ortho.  Final Clinical Impressions(s) / ED Diagnoses   Final diagnoses:  Peripheral edema  Diarrhea, unspecified type  Chronic pain of right knee  Pain of right hip joint  CKD (chronic kidney disease) stage 3, GFR 30-59 ml/min (HCC)  Acute cystitis without hematuria    ED Discharge Orders         Ordered    cephALEXin (KEFLEX) 500 MG capsule  4 times daily     06/15/18 2102    oxyCODONE-acetaminophen (PERCOCET/ROXICET) 5-325 MG tablet  Every 4 hours PRN     06/15/18 2102           Isla Pence, MD 06/15/18 2106

## 2018-06-16 ENCOUNTER — Telehealth: Payer: Self-pay | Admitting: Internal Medicine

## 2018-06-16 DIAGNOSIS — R197 Diarrhea, unspecified: Secondary | ICD-10-CM

## 2018-06-16 LAB — GASTROINTESTINAL PANEL BY PCR, STOOL (REPLACES STOOL CULTURE)
ADENOVIRUS F40/41: NOT DETECTED
ASTROVIRUS: NOT DETECTED
CAMPYLOBACTER SPECIES: NOT DETECTED
Cryptosporidium: NOT DETECTED
Cyclospora cayetanensis: NOT DETECTED
ENTEROTOXIGENIC E COLI (ETEC): NOT DETECTED
Entamoeba histolytica: NOT DETECTED
Enteroaggregative E coli (EAEC): NOT DETECTED
Enteropathogenic E coli (EPEC): NOT DETECTED
Giardia lamblia: NOT DETECTED
NOROVIRUS GI/GII: NOT DETECTED
PLESIMONAS SHIGELLOIDES: NOT DETECTED
Rotavirus A: NOT DETECTED
SHIGA LIKE TOXIN PRODUCING E COLI (STEC): NOT DETECTED
Salmonella species: NOT DETECTED
Sapovirus (I, II, IV, and V): NOT DETECTED
Shigella/Enteroinvasive E coli (EIEC): NOT DETECTED
Vibrio cholerae: NOT DETECTED
Vibrio species: NOT DETECTED
Yersinia enterocolitica: NOT DETECTED

## 2018-06-16 NOTE — Telephone Encounter (Signed)
I informed Heather [name] that we give ondansetron for diarrhea. Per Dr Carlean Purl studies have shown it helps. She went to the ED last night and they are running a GI path panel. She will await those results and call us back if any other questions.

## 2018-06-16 NOTE — Telephone Encounter (Signed)
So the ondansetron did not help diarrhea?

## 2018-06-16 NOTE — Telephone Encounter (Signed)
Pt called stating that Dr. Carlean Purl that was going to prescribe med for diarrhea but she was prescribed zofran for nausea. She is confused. Pls call her to clarify

## 2018-06-17 NOTE — Telephone Encounter (Signed)
She said she took it yesterday after our talk and it caused her to have diarrhea x 5/6 times. She said when she took it in the past it gave her diarrhea also which she didn't mention yesterday. She is still awaiting GI path results.

## 2018-06-18 NOTE — Telephone Encounter (Signed)
She had Salmonella in May at [facility] and though testing showed it went away did not seem to get better with treatment  Please have her do the following  1) Stool culture 2) After we know she has submitted the stool culture start Z pak x 1 generic ok - do not Rx til we know stool cx in lab 3) in 2 weeks after Z pak Rx made repeat GI pathogen panel

## 2018-06-18 NOTE — Telephone Encounter (Signed)
Left patient message to call me back.

## 2018-06-18 NOTE — Telephone Encounter (Signed)
The pathogen panel showed Salmonella

## 2018-06-23 NOTE — Telephone Encounter (Signed)
I was able to reach Heather [name] today on the phone. She said she didn't get my message last week. She is in [facility] and plans to come home 06/29/18. She is still having 6 to 7 episodes daily of diarrhea. She still has the abdominal pain which her Dr told her was from the UTI she has. She is currently on Keflex 500mg  , one QID, #28. She started them 06/16/18. She said she still has "quite a few in the bottle".  I told her we need to get her to do a stool culture. I told her I will update Dr Carlean Purl and see if this new information affects the timing of the stool testing. I confirmed her phone # before we hung up.

## 2018-07-01 NOTE — Telephone Encounter (Signed)
Left message for patient to return my call.

## 2018-07-01 NOTE — Telephone Encounter (Signed)
Still want stool culture

## 2018-07-02 NOTE — Telephone Encounter (Signed)
Stool culture order placed in epic. Hopefully she will be back in town soon to complete this test.

## 2018-07-03 ENCOUNTER — Other Ambulatory Visit: Payer: Medicare Other

## 2018-07-03 NOTE — Telephone Encounter (Signed)
She called back to get the lab hours and our zip code for her GPS. Hopefully still coming today.

## 2018-07-03 NOTE — Telephone Encounter (Signed)
I spoke with Heather [name] and she will come today to give [place] a stool sample. I told her to let me know when it's done. She has finished her Keflex but feels like she still has a UTI. I told her to contact her PCP about doing another urine test.

## 2018-07-04 ENCOUNTER — Other Ambulatory Visit: Payer: Medicare Other

## 2018-07-04 DIAGNOSIS — R197 Diarrhea, unspecified: Secondary | ICD-10-CM

## 2018-07-04 MED ORDER — AZITHROMYCIN 250 MG PO TABS: ORAL_TABLET | ORAL | 0 refills | Status: DC

## 2018-07-04 NOTE — Addendum Note (Signed)
Addended by: Trenda Moots on: 05/04/9389 03:04 PM   Modules accepted: Orders

## 2018-07-04 NOTE — Telephone Encounter (Signed)
Patient just dropped off her stool culture in our lab. I'll send in z-pak now. She will need to do the GI path panel 07/23/18 to be 2 weeks after her z-pak is finished if she starts it tomorrow. Order put into epic. She is aware of this.

## 2018-07-04 NOTE — Addendum Note (Signed)
Addended by: Trenda Moots on: 12/05/6145 03:04 PM   Modules accepted: Orders

## 2018-07-07 NOTE — Progress Notes (Signed)
I need more information as to why the test was cancelled

## 2018-07-08 ENCOUNTER — Telehealth: Payer: Self-pay | Admitting: Internal Medicine

## 2018-07-08 LAB — STOOL CULTURE
MICRO NUMBER: 91068101
MICRO NUMBER:: 91068099
MICRO NUMBER:: 91068100
SHIGA RESULT:: NOT DETECTED
SPECIMEN QUALITY: ADEQUATE
SPECIMEN QUALITY: ADEQUATE
SPECIMEN QUALITY:: ADEQUATE

## 2018-07-08 LAB — PANCREATIC ELASTASE, FECAL

## 2018-07-08 NOTE — Telephone Encounter (Signed)
I spoke with Heather [name] and told her I'll get back to her as soon as Dr Carlean Purl lets me know the plan. She gave me an update on her symptoms which I routed to him in the lab result note.

## 2018-07-09 MED ORDER — PANCRELIPASE (LIP-PROT-AMYL) 40000-126000 UNITS PO CPEP: 2.0000 | ORAL_CAPSULE | Freq: Three times a day (TID) | ORAL | 0 refills | Status: DC

## 2018-07-09 NOTE — Progress Notes (Signed)
Well I wanted the fecal elastase test done and it was not since she did not have the proper container  We can just treat for pancreatic insufficiency instead   Have her get some samples of Creon 36 K or Zenpep 40 k and take 2 w/ meals and 1 w/ snacks for 1 week and let me know what that does

## 2018-07-14 ENCOUNTER — Telehealth: Payer: Self-pay | Admitting: Internal Medicine

## 2018-07-14 NOTE — Telephone Encounter (Signed)
I did not contact the patient.

## 2018-07-15 ENCOUNTER — Telehealth: Payer: Self-pay | Admitting: Internal Medicine

## 2018-07-15 DIAGNOSIS — I2510 Atherosclerotic heart disease of native coronary artery without angina pectoris: Secondary | ICD-10-CM

## 2018-07-15 HISTORY — DX: Atherosclerotic heart disease of native coronary artery without angina pectoris: I25.10

## 2018-07-15 NOTE — Telephone Encounter (Signed)
Error

## 2018-07-22 ENCOUNTER — Telehealth: Payer: Self-pay

## 2018-07-22 NOTE — Telephone Encounter (Signed)
I explained to her what the creon was to help with and she said she will try to come tomorrow for the stool testing.

## 2018-07-22 NOTE — Telephone Encounter (Signed)
Patient reminded to come tomorrow for GI path panel. I told her I will call her back later today to talk about samples she has. Per Dr Carlean Purl she was given Creon.

## 2018-07-22 NOTE — Telephone Encounter (Signed)
-----   Message from Heather [name], [place] sent at 07/04/2018  5:41 PM EDT ----- Call and remind her to come do GI path panel on 07/23/18.

## 2018-07-23 ENCOUNTER — Other Ambulatory Visit: Payer: Medicare Other

## 2018-07-23 DIAGNOSIS — R197 Diarrhea, unspecified: Secondary | ICD-10-CM

## 2018-07-23 NOTE — Telephone Encounter (Signed)
Heather [name] came by today and did the stool GI pathogen panel while she was here so she wouldn't have to drive all the way back with the stool sample. I double checked her chart and she was given Zenpep samples not creon samples. We will await the stool study results.

## 2018-07-24 LAB — GASTROINTESTINAL PATHOGEN PANEL PCR
C. difficile Tox A/B, PCR: NOT DETECTED
Campylobacter, PCR: NOT DETECTED
Cryptosporidium, PCR: NOT DETECTED
E COLI (ETEC) LT/ST, PCR: NOT DETECTED
E COLI 0157, PCR: NOT DETECTED
E coli (STEC) stx1/stx2, PCR: NOT DETECTED
GIARDIA LAMBLIA, PCR: NOT DETECTED
Norovirus, PCR: NOT DETECTED
Rotavirus A, PCR: NOT DETECTED
Salmonella, PCR: NOT DETECTED
Shigella, PCR: NOT DETECTED

## 2018-07-24 NOTE — Progress Notes (Signed)
No infection  Did pancreatic enzymes help her?

## 2018-07-29 ENCOUNTER — Other Ambulatory Visit: Payer: Self-pay | Admitting: Internal Medicine

## 2018-07-29 NOTE — Progress Notes (Signed)
I need a review of all current meds so I know what she is on I went through and dced the Zenpep and onetime abx  She needs an appointment to see me next available -

## 2018-07-30 ENCOUNTER — Telehealth: Payer: Self-pay

## 2018-07-30 NOTE — Telephone Encounter (Signed)
Please let her know I will not be prescribing pain medication

## 2018-07-30 NOTE — Telephone Encounter (Signed)
[name] called in and we have updated her medicine list and made her an appointment to see Dr Carlean Purl next week. He wants to go over her medicine. She wants to talk to him about getting pain medicine.

## 2018-07-30 NOTE — Telephone Encounter (Signed)
I told her when we spoke that we don't usually do pain rx's.

## 2018-08-06 ENCOUNTER — Other Ambulatory Visit (INDEPENDENT_AMBULATORY_CARE_PROVIDER_SITE_OTHER): Payer: Medicare Other

## 2018-08-06 ENCOUNTER — Ambulatory Visit (INDEPENDENT_AMBULATORY_CARE_PROVIDER_SITE_OTHER)
Admission: RE | Admit: 2018-08-06 | Discharge: 2018-08-06 | Disposition: A | Discharge status: Home / Self Care | Payer: Medicare Other | Source: Ambulatory Visit | Attending: Internal Medicine | Admitting: Internal Medicine

## 2018-08-06 ENCOUNTER — Encounter: Payer: Self-pay | Admitting: Internal Medicine

## 2018-08-06 ENCOUNTER — Ambulatory Visit (INDEPENDENT_AMBULATORY_CARE_PROVIDER_SITE_OTHER): Payer: Medicare Other | Admitting: Internal Medicine

## 2018-08-06 VITALS — BP 138/70 | HR 84 | Ht 63.0 in | Wt 138.0 lb

## 2018-08-06 DIAGNOSIS — R197 Diarrhea, unspecified: Secondary | ICD-10-CM

## 2018-08-06 DIAGNOSIS — R1084 Generalized abdominal pain: Secondary | ICD-10-CM

## 2018-08-06 DIAGNOSIS — E119 Type 2 diabetes mellitus without complications: Secondary | ICD-10-CM

## 2018-08-06 DIAGNOSIS — R634 Abnormal weight loss: Secondary | ICD-10-CM

## 2018-08-06 DIAGNOSIS — Z794 Long term (current) use of insulin: Secondary | ICD-10-CM

## 2018-08-06 LAB — CBC WITH DIFFERENTIAL/PLATELET
BASOS ABS: 0.1 10*3/uL (ref 0.0–0.1)
Basophils Relative: 1 % (ref 0.0–3.0)
EOS ABS: 0.2 10*3/uL (ref 0.0–0.7)
Eosinophils Relative: 2 % (ref 0.0–5.0)
HEMATOCRIT: 35.8 % — AB (ref 36.0–46.0)
HEMOGLOBIN: 12 g/dL (ref 12.0–15.0)
LYMPHS PCT: 24.7 % (ref 12.0–46.0)
Lymphs Abs: 2.3 10*3/uL (ref 0.7–4.0)
MCHC: 33.4 g/dL (ref 30.0–36.0)
MCV: 89 fl (ref 78.0–100.0)
MONOS PCT: 6.5 % (ref 3.0–12.0)
Monocytes Absolute: 0.6 10*3/uL (ref 0.1–1.0)
NEUTROS ABS: 6.2 10*3/uL (ref 1.4–7.7)
Neutrophils Relative %: 65.8 % (ref 43.0–77.0)
PLATELETS: 209 10*3/uL (ref 150.0–400.0)
RBC: 4.03 Mil/uL (ref 3.87–5.11)
RDW: 17 % — ABNORMAL HIGH (ref 11.5–15.5)
WBC: 9.4 10*3/uL (ref 4.0–10.5)

## 2018-08-06 LAB — HEMOGLOBIN A1C: HEMOGLOBIN A1C: 6 % (ref 4.6–6.5)

## 2018-08-06 LAB — COMPREHENSIVE METABOLIC PANEL
ALBUMIN: 4.3 g/dL (ref 3.5–5.2)
ALK PHOS: 122 U/L — AB (ref 39–117)
ALT: 14 U/L (ref 0–35)
AST: 18 U/L (ref 0–37)
BILIRUBIN TOTAL: 0.5 mg/dL (ref 0.2–1.2)
BUN: 19 mg/dL (ref 6–23)
CO2: 30 mEq/L (ref 19–32)
CREATININE: 1.32 mg/dL — AB (ref 0.40–1.20)
Calcium: 10.2 mg/dL (ref 8.4–10.5)
Chloride: 104 mEq/L (ref 96–112)
GFR: 42.79 mL/min — AB (ref >60.00)
Glucose, Bld: 122 mg/dL — ABNORMAL HIGH (ref 70–99)
Potassium: 3.7 mEq/L (ref 3.5–5.1)
Sodium: 142 mEq/L (ref 135–145)
TOTAL PROTEIN: 7.7 g/dL (ref 6.0–8.3)

## 2018-08-06 LAB — TSH: TSH: 0.78 u[IU]/mL (ref 0.35–4.50)

## 2018-08-06 MED ORDER — GLYCOPYRROLATE 2 MG PO TABS: 2.0000 mg | ORAL_TABLET | Freq: Three times a day (TID) | ORAL | 1 refills | Status: DC

## 2018-08-06 NOTE — Progress Notes (Signed)
Heather [name] 67 y.o. 20-Jan-1951 [id number]  Assessment & Plan:   Encounter Diagnoses  Name Primary?  . Diarrhea, unspecified type Yes  . Loss of weight   . Generalized abdominal cramping   . Type 2 diabetes mellitus treated with insulin ([place])     Orders Placed This Encounter  Procedures  . DG Abd 2 Views  . CBC with Differential/Platelet  . Comprehensive metabolic panel  . TSH  . HgB A1c    These tests were all okay, 6%.  Variety of medications and is seen myself, Dr. Michail Sermon Dr. Britta Mccreedy and has been to [facility].  She has also seen Dr. Laural Golden.  A variety of work-ups other than some ileal erosions have been unrevealing, irritable bowel syndrome certainly seems like the most likely problem here.  I am not sure why she is losing weight but I do not think it is a significant GI problem causing that and is likely multifactorial. She had a large amount of soft but relatively formed stool in the rectum suggesting soft impaction x-ray did not show that. I have prescribed glycopyrrolate to see if that relieves her pain symptoms.  Consider a laxative purge with MiraLAX pending this therapeutic trial. I appreciate the opportunity to care for this patient.  CC: [name], MD  Subjective:   Chief Complaint: Diarrhea  HPI Heather [name] is here with persistent watery diarrhea problems and generalized abdominal cramping associated with defecation.  Recall that in the spring she was in any pain and there was a Salmonella positive stool test, she was treated for that but continued to have watery diarrhea so I rechecked a GI pathogen panel it was negative.  I tried to get a pancreatic elastase test done but the lab submission was incorrect, i.e. wrong tube or not the right tubes.  She has a frequent watery diarrhea that has been unresponsive to multiple treatments.  She has had negative random colon biopsies.  She does not have celiac disease.  In 2012 she had patchy chronic inflammation in the  she might have IBD or microscopic changes but on a subsequent colonoscopy and repeat biopsy in 2017 biopsies were all negative.  She has failed Lotronex.  She has failed other antidiarrheals.  We tried Zen Pep it did not help.  She is now losing weight.  Though I will say it is stable over the last 2 months he thinks everything we have tried makes her feel worse.  She is also been on cholestyramine and Imodium Lomotil.  Wt Readings from Last 3 Encounters:  08/06/18 138 lb (62.6 kg)  06/15/18 135 lb (61.2 kg)  04/07/18 154 lb (69.9 kg)    Allergies  Allergen Reactions  . Duloxetine Hcl Swelling  . Fluocinolone Swelling  . Acetaminophen Itching and Swelling    Pt states "some swelling"  . Amlodipine Besylate Swelling    Swelling of feet, fluid retention  . Ciprofloxacin Nausea And Vomiting and Swelling    Swelling of face, jaw, and lips  . Hydrocodone-Acetaminophen Itching  . Metformin Other (See Comments)    REACTION: GI UPSET  . Nsaids Other (See Comments)    "HAS BLEEDING ULCERS"  . Quinolones Swelling  . Sulfamethoxazole Swelling    Swelling of feet, legs  . Sulfonamide Derivatives Swelling  . Valsartan Swelling  . Cozaar [Losartan] Swelling and Rash  . Lisinopril Swelling and Rash    Oral swelling, and red streaks on arms/stomach  . Tape Itching and Rash    Paper  tape can only be used on this patient   Current Meds  Medication Sig  . allopurinol (ZYLOPRIM) 100 MG tablet Take 0.5 tablets (50 mg total) by mouth daily. (Patient taking differently: Take 100 mg by mouth every morning. )  . Ascorbic Acid (VITAMIN C WITH ROSE HIPS) 500 MG tablet Take 500 mg by mouth every morning.  Marland Kitchen aspirin EC 81 MG tablet Take 81 mg by mouth every morning.   Marland Kitchen atorvastatin (LIPITOR) 80 MG tablet Take 80 mg by mouth every morning.  . Cholecalciferol (VITAMIN D) 2000 units CAPS Take 2,000 Units by mouth every morning.   . citalopram (CELEXA) 20 MG tablet Take 20 mg by mouth daily.  .  cyanocobalamin (,VITAMIN B-12,) 1000 MCG/ML injection Inject 1,000 mcg into the muscle every 30 (thirty) days.    . diazepam (VALIUM) 5 MG tablet Take 1 tablet (5 mg total) by mouth 2 (two) times daily as needed for anxiety.  Marland Kitchen esomeprazole (NEXIUM) 40 MG capsule Take 40 mg by mouth daily before breakfast.    . furosemide (LASIX) 40 MG tablet Take 1 tablet (40 mg total) by mouth daily. (Patient taking differently: Take 40 mg by mouth every morning. )  . HUMALOG KWIKPEN 100 UNIT/ML KiwkPen as directed.  . indomethacin (INDOCIN) 50 MG capsule Take 50 mg by mouth 3 (three) times daily as needed.  . Insulin Detemir (LEVEMIR FLEXTOUCH) 100 UNIT/ML Pen Inject 80 Units into the skin at bedtime. (Patient taking differently: Inject 40 Units into the skin at bedtime. Patient adjust for her sugars)  . Insulin Lispro (HUMALOG KWIKPEN [place]) Inject 1-21 Units into the skin 3 (three) times daily with meals. Per home sliding scale  . Melatonin 10 MG TABS Take 10 mg by mouth at bedtime as needed.   . metoprolol (LOPRESSOR) 100 MG tablet Take 100 mg by mouth 2 (two) times daily.   . ondansetron (ZOFRAN) 4 MG tablet Take 1 tablet (4 mg total) by mouth every 8 (eight) hours as needed for nausea or vomiting.  Marland Kitchen OZEMPIC, 0.25 OR 0.5 MG/DOSE, 2 MG/1.5ML SOPN Inject 0.5 Units into the skin once a week.  . potassium chloride SA (K-DUR,KLOR-CON) 20 MEQ tablet Take 20 mEq by mouth every morning.   . Semaglutide, 1 MG/DOSE, (OZEMPIC, 1 MG/DOSE,) 2 MG/1.5ML SOPN Inject 0.5 mg into the skin once a week.  Marland Kitchen tiZANidine (ZANAFLEX) 4 MG tablet Take 8 mg by mouth at bedtime.   Past Medical History:  Diagnosis Date  . Acute renal failure superimposed on stage 3 chronic kidney disease ([place]) 01/21/2016  . B12 deficiency   . Cellulitis and abscess    Abdomen and buttocks  . CKD (chronic kidney disease) stage 3, GFR 30-59 ml/min ([facility])   . Closed fracture of distal end of femur, unspecified fracture morphology, initial encounter ([place])     . Depression   . Diabetic neuropathy ([place])   . DJD (degenerative joint disease)    Right forminal stenosis C4-5  . Essential hypertension   . Femur fracture, right ([place]) 03/30/2017  . Folliculitis   . Gout   . Headache(784.0)   . Hiatal hernia   . History of bronchitis   . History of cardiac catheterization    No significant CAD 2012  . Insomnia   . Irritable bowel syndrome   . Mixed hyperlipidemia   . Osteoarthritis   . Palpitations   . Reflux esophagitis   . Tinnitus    Right  . Type 2 diabetes mellitus ([place])  Past Surgical History:  Procedure Laterality Date  . c-spine surgery     03/2007  . CARDIAC CATHETERIZATION    . CHOLECYSTECTOMY    . COLONOSCOPY  04/21/2011; 05/29/11   6/12: [place] - ?AVM at IC valve, inflammatory changes at [facility] valve [facility]); 7/12 - gessner; IC valve erosions, look chronic and probably Crohn's per path  . colonoscopy  2017   [place]: random biopsies normal. TI normal  . ESOPHAGOGASTRODUODENOSCOPY  05/29/11   normal  . ESOPHAGOGASTRODUODENOSCOPY N/A 01/21/2016   Dr. Michail Sermon: normal  . FEMUR IM NAIL Right 04/10/2018   Procedure: INTRAMEDULLARY (IM) NAIL FEMORAL;  Surgeon: Paralee Cancel, MD;  Location: [facility];  Service: Orthopedics;  Laterality: Right;  . HARDWARE REMOVAL Right 04/10/2018   Procedure: HARDWARE REMOVAL RIGHT DISTAL FEMUR;  Surgeon: Paralee Cancel, MD;  Location: [facility];  Service: Orthopedics;  Laterality: Right;  . IR FLUORO GUIDE CV LINE RIGHT  04/01/2017  . IR US GUIDE VASC ACCESS RIGHT  04/01/2017  . JOINT REPLACEMENT    . ORIF FEMUR FRACTURE Right 03/31/2017   Procedure: OPEN REDUCTION INTERNAL FIXATION (ORIF) DISTAL FEMUR FRACTURE;  Surgeon: Paralee Cancel, MD;  Location: [place];  Service: Orthopedics;  Laterality: Right;  . Right knee arthroscopic surgery    . 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   Review of Systems As above  Objective:   Physical Exam BP 138/70   Pulse 84   Ht 5\' 3"  (1.6 m)   Wt 138 lb (62.6 kg)   BMI 24.45 kg/m  The patient is in no acute distress looking well-developed and well-nourished Abdominal exam soft diffusely tender bowel sounds present  Patti [place], [place] present. Rectal exam shows a large amount of soft formed stool, question soft impaction

## 2018-08-06 NOTE — Patient Instructions (Addendum)
Your provider has requested that you go to the basement level for lab work before leaving today. Press "B" on the elevator. The lab is located at the first door on the left as you exit the elevator.   We have sent the following medications to your pharmacy for you to pick up at your convenience: glycopyrrolate    Please go to the x-ray department today before leaving.    We will call you with results and plans.    I appreciate the opportunity to care for you. Silvano Rusk, MD, [facility]

## 2018-08-07 ENCOUNTER — Telehealth: Payer: Self-pay | Admitting: Internal Medicine

## 2018-08-07 NOTE — Progress Notes (Signed)
Labs and xrays ok Give the glycopyrrolate a try and she should contact us with an update next week

## 2018-08-07 NOTE — Telephone Encounter (Signed)
Patient calling back again. Patient states medication Dr.Gessner called in for her needs prior auth for insurance to cover. Patient requesting a call back.

## 2018-08-07 NOTE — Telephone Encounter (Signed)
Pt called stating that she was returning your call. Pls call her again.  

## 2018-08-08 NOTE — Telephone Encounter (Signed)
I did a prior authorization thru her insurance # [id number] [employer] for her glycopyrrolate , [id number]-R10.84. She has tried multiple rx's with no relief. The PA # is [id number]. They said it will be 24-72 hours for an answer. I have informed the patient of this information and also went over her lab results from her recent visit. Once she gets and tries the medicine she will update [place] on how she's doing as Dr Carlean Purl requested.

## 2018-08-11 ENCOUNTER — Encounter: Payer: Self-pay | Admitting: Internal Medicine

## 2018-08-12 NOTE — Telephone Encounter (Signed)
The medicine has been denied. Please advise [name], thank you.

## 2018-08-15 MED ORDER — HYOSCYAMINE SULFATE ER 0.375 MG PO TB12: 0.3750 mg | ORAL_TABLET | Freq: Two times a day (BID) | ORAL | 0 refills | Status: DC

## 2018-08-15 NOTE — Telephone Encounter (Signed)
Patient informed and rx sent in, confirmed pharmacy.

## 2018-08-15 NOTE — Telephone Encounter (Signed)
Try generic Levbid 0.375 mg bid # 60 no refill  When she gets it she should try it and update [place] in 1-2 weeks

## 2019-02-03 ENCOUNTER — Telehealth: Payer: Self-pay | Admitting: *Deleted

## 2019-02-03 NOTE — Telephone Encounter (Signed)
   Primary Cardiologist:  No primary care provider on file.   Patient contacted.  History reviewed.  No symptoms to suggest any unstable cardiac conditions.  Based on discussion, with current pandemic situation, we will be postponing this appointment for Heather [name] with a plan for f/u in June 2020 or sooner if feasible/necessary.  If symptoms change, she has been instructed to contact our office.    Marlou Sa, RN  02/03/2019 8:36 AM         .

## 2019-02-06 ENCOUNTER — Ambulatory Visit: Payer: Medicare Other | Admitting: Cardiology

## 2019-04-20 ENCOUNTER — Ambulatory Visit: Payer: Medicare Other | Admitting: Cardiology

## 2019-05-23 ENCOUNTER — Other Ambulatory Visit: Payer: Self-pay

## 2019-05-23 ENCOUNTER — Emergency Department (HOSPITAL_COMMUNITY)
Admission: EM | Admit: 2019-05-23 | Discharge: 2019-05-24 | Disposition: A | Discharge status: Home / Self Care | Payer: Medicare Other | Attending: Emergency Medicine | Admitting: Emergency Medicine

## 2019-05-23 ENCOUNTER — Encounter (HOSPITAL_COMMUNITY): Payer: Self-pay | Admitting: *Deleted

## 2019-05-23 ENCOUNTER — Emergency Department (HOSPITAL_COMMUNITY): Payer: Medicare Other

## 2019-05-23 DIAGNOSIS — Z7982 Long term (current) use of aspirin: Secondary | ICD-10-CM | POA: Diagnosis not present

## 2019-05-23 DIAGNOSIS — E1122 Type 2 diabetes mellitus with diabetic chronic kidney disease: Secondary | ICD-10-CM | POA: Diagnosis not present

## 2019-05-23 DIAGNOSIS — Y999 Unspecified external cause status: Secondary | ICD-10-CM | POA: Insufficient documentation

## 2019-05-23 DIAGNOSIS — Z79899 Other long term (current) drug therapy: Secondary | ICD-10-CM | POA: Insufficient documentation

## 2019-05-23 DIAGNOSIS — S52502A Unspecified fracture of the lower end of left radius, initial encounter for closed fracture: Secondary | ICD-10-CM

## 2019-05-23 DIAGNOSIS — W010XXA Fall on same level from slipping, tripping and stumbling without subsequent striking against object, initial encounter: Secondary | ICD-10-CM | POA: Insufficient documentation

## 2019-05-23 DIAGNOSIS — Y9301 Activity, walking, marching and hiking: Secondary | ICD-10-CM | POA: Diagnosis not present

## 2019-05-23 DIAGNOSIS — Y92003 Bedroom of unspecified non-institutional (private) residence as the place of occurrence of the external cause: Secondary | ICD-10-CM | POA: Insufficient documentation

## 2019-05-23 DIAGNOSIS — S6992XA Unspecified injury of left wrist, hand and finger(s), initial encounter: Secondary | ICD-10-CM | POA: Diagnosis present

## 2019-05-23 DIAGNOSIS — Z794 Long term (current) use of insulin: Secondary | ICD-10-CM | POA: Diagnosis not present

## 2019-05-23 DIAGNOSIS — I129 Hypertensive chronic kidney disease with stage 1 through stage 4 chronic kidney disease, or unspecified chronic kidney disease: Secondary | ICD-10-CM | POA: Insufficient documentation

## 2019-05-23 DIAGNOSIS — N183 Chronic kidney disease, stage 3 (moderate): Secondary | ICD-10-CM | POA: Insufficient documentation

## 2019-05-23 MED ORDER — LIDOCAINE-EPINEPHRINE (PF) 2 %-1:200000 IJ SOLN
20.0000 mL | Freq: Once | INTRAMUSCULAR | Status: AC
  Administered 2019-05-24: 20 mL via INTRADERMAL
  Filled 2019-05-23: qty 20

## 2019-05-23 MED ORDER — FENTANYL CITRATE (PF) 100 MCG/2ML IJ SOLN
50.0000 ug | Freq: Once | INTRAMUSCULAR | Status: AC
  Administered 2019-05-24: 50 ug via INTRAVENOUS
  Filled 2019-05-23: qty 2

## 2019-05-23 MED ORDER — FENTANYL CITRATE (PF) 100 MCG/2ML IJ SOLN
50.0000 ug | Freq: Once | INTRAMUSCULAR | Status: AC
  Administered 2019-05-23: 50 ug via INTRAVENOUS
  Filled 2019-05-23: qty 2

## 2019-05-23 NOTE — ED Provider Notes (Signed)
MSE was initiated and I personally evaluated the patient and placed orders (if any) at  10:51 PM on May 23, 2019.  The patient appears stable so that the remainder of the MSE may be completed by another provider.  The patient had a fall just prior to arrival when she was walking around her bed to get to the bathroom.  She landed on the hard floor on her right hand and felt acute onset of pain, noticed that there was a deformity around the wrist.  On exam she does have a deformity of the distal radius, pulses normal, capillary refill is normal, sensation is normal to the hand on the right.  She is right-hand dominant, x-ray ordered, pain medicine ordered, the patient will likely need to have this fracture reduced prior to follow-up with orthopedics.   Noemi Chapel, MD 05/23/19 2252

## 2019-05-23 NOTE — ED Triage Notes (Signed)
Pt fell after loosing her balance. Tried to catch herself and now has swelling and deformity noted to right wrist.

## 2019-05-23 NOTE — ED Provider Notes (Signed)
[facility] EMERGENCY DEPARTMENT Provider Note   CSN: [id number] Arrival date & time: 05/23/19  2233    History   Chief Complaint Chief Complaint  Patient presents with  . Fall    HPI Heather [name] is a 68 y.o. female.     Patient presents to the emergency department for evaluation of right wrist pain after a fall.  Patient reports that she got up from bed to go to the bathroom and lost her balance.  She thinks that she caught her knee on the edge of the bed causing her to fall.  She tried to catch herself with her right arm, landed on outstretched right hand.  Patient complaining of severe pain of the right wrist.  She did not hit her head.  No loss of consciousness.  No neck pain or back pain.  Patient complaining of isolated wrist pain.     Past Medical History:  Diagnosis Date  . Acute renal failure superimposed on stage 3 chronic kidney disease ([place]) 01/21/2016  . B12 deficiency   . Cellulitis and abscess    Abdomen and buttocks  . CKD (chronic kidney disease) stage 3, GFR 30-59 ml/min (HCC)   . Closed fracture of distal end of femur, unspecified fracture morphology, initial encounter ([place])   . Depression   . Diabetic neuropathy ([place])   . DJD (degenerative joint disease)    Right forminal stenosis C4-5  . Essential hypertension   . Femur fracture, right ([place]) 03/30/2017  . Folliculitis   . Gout   . Headache(784.0)   . Hiatal hernia   . History of bronchitis   . History of cardiac catheterization    No significant CAD 2012  . Insomnia   . Irritable bowel syndrome   . Mixed hyperlipidemia   . Osteoarthritis   . Palpitations   . Reflux esophagitis   . Tinnitus    Right  . Type 2 diabetes mellitus [facility])     Patient Active Problem List   Diagnosis Date Noted  . Closed right hip fracture ([place]) 04/08/2018  . Salmonella   . Elevated lipase   . Gastroenteritis   . Protein-calorie malnutrition, severe 03/05/2018  . Neuropathy 03/02/2015  . Pain in the  chest   . Chest pain 12/22/2014  . Essential hypertension 12/22/2014  . Diabetes mellitus with chronic kidney disease ([place]) 12/22/2014  . Hiatal hernia 12/22/2014  . GERD (gastroesophageal reflux disease) 12/22/2014  . Gout 12/22/2014  . Mixed hyperlipidemia 12/22/2014  . Depression 12/22/2014  . Chronic kidney disease, stage III (moderate) ([place]) 08/25/2014  . Vitamin B12 deficiency 07/08/2011  . Personal history of failed moderate sedation 04/16/2011  . Chronic Nausea and Vomiting - ? gastroapresis 04/16/2011  . Early satiety 04/16/2011  . Irritable bowel syndrome 04/16/2011  . Benign paroxysmal positional vertigo 11/20/2010  . TRICUSPID REGURGITATION, MODERATE 11/20/2010  . PULMONARY HYPERTENSION, MILD 11/20/2010  . LEG EDEMA, BILATERAL 11/20/2010  . DYSPNEA 11/20/2010  . NONSPECIFIC ABNORMAL ELECTROCARDIOGRAM 11/20/2010  . CHEST WALL PAIN, HX OF 11/20/2010    Past Surgical History:  Procedure Laterality Date  . c-spine surgery     03/2007  . CARDIAC CATHETERIZATION    . CHOLECYSTECTOMY    . COLONOSCOPY  04/21/2011; 05/29/11   6/12: [facility] - ?AVM at IC valve, inflammatory changes at [facility] valve [facility]); 7/12 - gessner; IC valve erosions, look chronic and probably Crohn's per path  . colonoscopy  2017   [place]: random biopsies normal. TI normal  .  ESOPHAGOGASTRODUODENOSCOPY  05/29/11   normal  . ESOPHAGOGASTRODUODENOSCOPY N/A 01/21/2016   Dr. Michail Sermon: normal  . FEMUR IM NAIL Right 04/10/2018   Procedure: INTRAMEDULLARY (IM) NAIL FEMORAL;  Surgeon: Paralee Cancel, MD;  Location: [facility];  Service: Orthopedics;  Laterality: Right;  . HARDWARE REMOVAL Right 04/10/2018   Procedure: HARDWARE REMOVAL RIGHT DISTAL FEMUR;  Surgeon: Paralee Cancel, MD;  Location: [facility];  Service: Orthopedics;  Laterality: Right;  . IR FLUORO GUIDE CV LINE RIGHT  04/01/2017  . IR US GUIDE VASC ACCESS RIGHT  04/01/2017  . JOINT REPLACEMENT    . ORIF FEMUR FRACTURE Right 03/31/2017   Procedure:  OPEN REDUCTION INTERNAL FIXATION (ORIF) DISTAL FEMUR FRACTURE;  Surgeon: Paralee Cancel, MD;  Location: [place];  Service: Orthopedics;  Laterality: Right;  . Right knee arthroscopic surgery    . TOTAL ABDOMINAL HYSTERECTOMY       OB History   No obstetric history on file.      Home Medications    Prior to Admission medications   Medication Sig Start Date End Date Taking? Authorizing Provider  allopurinol (ZYLOPRIM) 100 MG tablet Take 0.5 tablets (50 mg total) by mouth daily. Patient taking differently: Take 100 mg by mouth every morning.  03/13/18   Barton Dubois, MD  Ascorbic Acid (VITAMIN C WITH ROSE HIPS) 500 MG tablet Take 500 mg by mouth every morning.    [provider]  aspirin EC 81 MG tablet Take 81 mg by mouth every morning.     [provider]  atorvastatin (LIPITOR) 80 MG tablet Take 80 mg by mouth every morning.    [provider]  Cholecalciferol (VITAMIN D) 2000 units CAPS Take 2,000 Units by mouth every morning.     [provider]  citalopram (CELEXA) 20 MG tablet Take 20 mg by mouth daily.    [provider]  cyanocobalamin (,VITAMIN B-12,) 1000 MCG/ML injection Inject 1,000 mcg into the muscle every 30 (thirty) days.      [provider]  diazepam (VALIUM) 5 MG tablet Take 1 tablet (5 mg total) by mouth 2 (two) times daily as needed for anxiety. 04/14/18   Mariel Aloe, MD  esomeprazole (NEXIUM) 40 MG capsule Take 40 mg by mouth daily before breakfast.      [provider]  furosemide (LASIX) 40 MG tablet Take 1 tablet (40 mg total) by mouth daily. Patient taking differently: Take 40 mg by mouth every morning.  01/26/16   Reyne Dumas, MD  HUMALOG KWIKPEN 100 UNIT/ML KiwkPen as directed. 07/04/18   [provider]  hyoscyamine (LEVBID) 0.375 MG 12 hr tablet Take 1 tablet (0.375 mg total) by mouth 2 (two) times daily. 08/15/18   Gatha Mayer, MD  indomethacin (INDOCIN) 50 MG capsule Take 50 mg by  mouth 3 (three) times daily as needed.    [provider]  Insulin Detemir (LEVEMIR FLEXTOUCH) 100 UNIT/ML Pen Inject 80 Units into the skin at bedtime. Patient taking differently: Inject 40 Units into the skin at bedtime. Patient adjust for her sugars 04/03/17   Rosita Fire, MD  Insulin Lispro (HUMALOG KWIKPEN [place]) Inject 1-21 Units into the skin 3 (three) times daily with meals. Per home sliding scale    [provider]  Melatonin 10 MG TABS Take 10 mg by mouth at bedtime as needed.     [provider]  metoprolol (LOPRESSOR) 100 MG tablet Take 100 mg by mouth 2 (two) times daily.  [provider]  ondansetron (ZOFRAN) 4 MG tablet Take 1 tablet (4 mg total) by mouth every 8 (eight) hours as needed for nausea or vomiting. 06/10/18   Gatha Mayer, MD  oxyCODONE-acetaminophen (PERCOCET) 5-325 MG tablet Take 2 tablets by mouth every 4 (four) hours as needed. 05/24/19   Orpah Greek, MD  oxyCODONE-acetaminophen (PERCOCET) 5-325 MG tablet Take 1 tablet by mouth every 4 (four) hours as needed. 05/24/19   Garreth Burnsworth, Gwenyth Allegra, MD  OZEMPIC, 0.25 OR 0.5 MG/DOSE, 2 MG/1.5ML SOPN Inject 0.5 Units into the skin once a week. 07/30/18   [provider]  potassium chloride SA (K-DUR,KLOR-CON) 20 MEQ tablet Take 20 mEq by mouth every morning.     [provider]  Semaglutide, 1 MG/DOSE, (OZEMPIC, 1 MG/DOSE,) 2 MG/1.5ML SOPN Inject 0.5 mg into the skin once a week.    [provider]  tiZANidine (ZANAFLEX) 4 MG tablet Take 8 mg by mouth at bedtime.    [provider]    Family History Family History  Problem Relation Age of Onset  . Diabetes Mother   . Colon cancer Brother        POSSIBLY. Patient states diagnosed at age 15, then later states age 83. unclear and limited historian    Social History Social History   Tobacco Use  . Smoking status: Never Smoker  . Smokeless tobacco: Never Used  Substance Use Topics   . Alcohol use: No  . Drug use: No     Allergies   Duloxetine hcl, Fluocinolone, Acetaminophen, Amlodipine besylate, Ciprofloxacin, Hydrocodone-acetaminophen, Metformin, Nsaids, Quinolones, Sulfamethoxazole, Sulfonamide derivatives, Valsartan, Cozaar [losartan], Lisinopril, and Tape   Review of Systems Review of Systems  Musculoskeletal: Positive for arthralgias.  All other systems reviewed and are negative.    Physical Exam Updated Vital Signs BP 104/64   Pulse 93   Temp 98.6 F (37 C) (Oral)   Resp 20   Ht 5\' 3"  (1.6 m)   Wt 56.7 kg   SpO2 100%   BMI 22.14 kg/m   Physical Exam Vitals signs and nursing note reviewed.  Constitutional:      General: She is not in acute distress.    Appearance: Normal appearance. She is well-developed.  HENT:     Head: Normocephalic and atraumatic.     Right Ear: Hearing normal.     Left Ear: Hearing normal.     Nose: Nose normal.  Eyes:     Conjunctiva/sclera: Conjunctivae normal.     Pupils: Pupils are equal, round, and reactive to light.  Neck:     Musculoskeletal: Normal range of motion and neck supple.  Cardiovascular:     Rate and Rhythm: Regular rhythm.     Heart sounds: S1 normal and S2 normal. No murmur. No friction rub. No gallop.   Pulmonary:     Effort: Pulmonary effort is normal. No respiratory distress.     Breath sounds: Normal breath sounds.  Chest:     Chest wall: No tenderness.  Abdominal:     General: Bowel sounds are normal.     Palpations: Abdomen is soft.     Tenderness: There is no abdominal tenderness. There is no guarding or rebound. Negative signs include Murphy's sign and McBurney's sign.     Hernia: No hernia is present.  Musculoskeletal:     Right wrist: She exhibits decreased range of motion, tenderness, bony tenderness, swelling and deformity. She exhibits no laceration.  Skin:    General: Skin is  warm and dry.     Findings: No rash.  Neurological:     Mental Status: She is alert and oriented  to person, place, and time.     GCS: GCS eye subscore is 4. GCS verbal subscore is 5. GCS motor subscore is 6.     Cranial Nerves: No cranial nerve deficit.     Sensory: No sensory deficit.     Coordination: Coordination normal.  Psychiatric:        Speech: Speech normal.        Behavior: Behavior normal.        Thought Content: Thought content normal.      ED Treatments / Results  Labs (all labs ordered are listed, but only abnormal results are displayed) Labs Reviewed - No data to display  EKG None  Radiology Dg Wrist Complete Right  Result Date: 05/23/2019 CLINICAL DATA:  Pain EXAM: RIGHT WRIST - COMPLETE 3+ VIEW COMPARISON:  None. FINDINGS: There is an acute displaced impacted fracture involving the distal radius. There is surrounding soft tissue swelling. There is significant dorsal angulation of the distal fracture fragment. There is likely a nondisplaced fracture involving the ulnar styloid process. IMPRESSION: Acute angulated displaced fracture of the distal radius with surrounding soft tissue swelling. Electronically Signed   By: Constance Holster M.D.   On: 05/23/2019 23:10    Procedures .Ortho Injury Treatment  Date/Time: 05/24/2019 12:33 AM Performed by: Orpah Greek, MD Authorized by: Orpah Greek, MD   Consent:    Consent obtained:  Verbal   Consent given by:  Patient   Risks discussed:  Nerve damage and restricted joint movement Universal protocol:    Procedure explained and questions answered to patient or proxy's satisfaction: yes     Site/side marked: yes     Immediately prior to procedure a time out was called: yes     Patient identity confirmed:  Verbally with patientInjury location: wrist Location details: right wrist Injury type: fracture Fracture type: distal radius Pre-procedure neurovascular assessment: neurovascularly intact Pre-procedure distal perfusion: normal Pre-procedure neurological function: normal Pre-procedure  range of motion: reduced Anesthesia: hematoma block  Anesthesia: Local anesthesia used: yes Local Anesthetic: lidocaine 2% without epinephrine Anesthetic total: 10 mL  Patient sedated: NoManipulation performed: yes Skeletal traction used: yes Immobilization: splint Splint type: sugar tong Supplies used: Ortho-Glass Post-procedure neurovascular assessment: post-procedure neurovascularly intact Post-procedure distal perfusion: normal Post-procedure neurological function: normal Post-procedure range of motion: improved Patient tolerance: patient tolerated the procedure well with no immediate complications    (including critical care time)  Medications Ordered in ED Medications  lidocaine-EPINEPHrine (XYLOCAINE W/EPI) 2 %-1:200000 (PF) injection 20 mL (20 mLs Intradermal Given 05/24/19 0002)  fentaNYL (SUBLIMAZE) injection 50 mcg (50 mcg Intravenous Given 05/23/19 2339)  fentaNYL (SUBLIMAZE) injection 50 mcg (50 mcg Intravenous Given 05/24/19 0001)     Initial Impression / Assessment and Plan / ED Course  I have reviewed the triage vital signs and the nursing notes.  Pertinent labs & imaging results that were available during my care of the patient were reviewed by me and considered in my medical decision making (see chart for details).        Patient presented to the emergency department for evaluation of left wrist injury from a fall on outstretched hand.  X-ray confirms distal radius fracture.  Patient did have dorsal angulation of the fracture fragment.  Patient provided analgesia and then a hematoma block was performed, patient splinted in a sugar tong splint.  During  splinting, distal fragment was reduced as best as I could.  Repeat x-ray deferred till follow-up with orthopedics.  She has seen Dr. Mechele Claude for previous surgeries, would like to follow-up with him.  Also provided contact information for on-call hand surgeon.  Final Clinical Impressions(s) / ED Diagnoses    Final diagnoses:  Closed fracture of distal end of left radius, unspecified fracture morphology, initial encounter    ED Discharge Orders         Ordered    oxyCODONE-acetaminophen (PERCOCET) 5-325 MG tablet  Every 4 hours PRN     05/24/19 0032    oxyCODONE-acetaminophen (PERCOCET) 5-325 MG tablet  Every 4 hours PRN     05/24/19 0033           Orpah Greek, MD 05/24/19 [phone]

## 2019-05-24 DIAGNOSIS — S52502A Unspecified fracture of the lower end of left radius, initial encounter for closed fracture: Secondary | ICD-10-CM | POA: Diagnosis not present

## 2019-05-24 MED ORDER — OXYCODONE-ACETAMINOPHEN 5-325 MG PO TABS: 2.0000 | ORAL_TABLET | ORAL | 0 refills | Status: DC | PRN

## 2019-05-24 MED ORDER — OXYCODONE-ACETAMINOPHEN 5-325 MG PO TABS: 1.0000 | ORAL_TABLET | ORAL | 0 refills | Status: DC | PRN

## 2019-05-25 MED FILL — Oxycodone w/ Acetaminophen Tab 5-325 MG: ORAL | Qty: 6 | Status: AC

## 2019-05-26 ENCOUNTER — Other Ambulatory Visit (HOSPITAL_COMMUNITY)
Admission: RE | Admit: 2019-05-26 | Discharge: 2019-05-26 | Disposition: A | Discharge status: Home / Self Care | Payer: Medicare Other | Source: Ambulatory Visit | Attending: Orthopaedic Surgery | Admitting: Orthopaedic Surgery

## 2019-05-26 DIAGNOSIS — Z20828 Contact with and (suspected) exposure to other viral communicable diseases: Secondary | ICD-10-CM | POA: Insufficient documentation

## 2019-05-26 DIAGNOSIS — S52509A Unspecified fracture of the lower end of unspecified radius, initial encounter for closed fracture: Secondary | ICD-10-CM | POA: Insufficient documentation

## 2019-05-26 LAB — SARS CORONAVIRUS 2 (TAT 6-24 HRS): SARS Coronavirus 2: NEGATIVE

## 2019-05-27 ENCOUNTER — Other Ambulatory Visit: Payer: Self-pay

## 2019-05-27 ENCOUNTER — Encounter (HOSPITAL_COMMUNITY): Payer: Self-pay | Admitting: *Deleted

## 2019-05-27 MED ORDER — CEFAZOLIN SODIUM-DEXTROSE 2-4 GM/100ML-% IV SOLN
2.0000 g | INTRAVENOUS | Status: AC
  Administered 2019-05-28: 11:00:00 2 g via INTRAVENOUS
  Filled 2019-05-27: qty 100

## 2019-05-27 NOTE — Progress Notes (Addendum)
Anesthesia Chart Review: SAME DAY WORK-UP    Case: [id number] Date/Time: 05/28/19 0945   Procedure: OPEN REDUCTION INTERNAL FIXATION (ORIF) DISTAL RADIAL FRACTURE (Right ) - with block 90 minutes   Anesthesia type: Monitor Anesthesia Care   Pre-op diagnosis: Right distal radius fracture   Location: [facility] OR ROOM 06 / [place] [facility]   Surgeon: Verner Mould, MD      DISCUSSION: Patient is a 68 year old female scheduled for the above procedure. She fell at home on 05/23/19 and sustained a right radius fracture.  History includes never smoker, HTN, IBS, palpitations, DM2 (with neuropathy), reflux esophagitis, hiatal hernia, CKD (stage III), HLD.    She denied shortness of breath, cough, fever, and chest pain at PAT RN phone interview.  She is a same-day work-up for further evaluation on the day of surgery.  Negative presurgical COVID-19 test on 05/26/2019.   PROVIDERS: [name], MD is PCP  Fran Lowes, MD is nephrologist ([place]). Last visit 12/02/18 with Ramiro Harvest, [place]. Silvano Rusk, MD is GI She has been seen by cardiology periodically over the years with no significant CAD noted on 2003, 2005, and 2012 cardiac catheterizations and negative nuclear stress tests in 2010 and 2014. She was last evaluated by Carlyle Dolly, MD on 02/17/18 for chronic chest pain, stable with previous negative work-up. She also had previous unremarkable cardiac work-up for syncope in 2016 (at [facility]).      LABS: She is for labs on arrival. A1c 6.0% last year (08/06/18). Cr 1.32-1.71 over the past year. She is followed by nephrology.   IMAGES: DG right wrist 05/23/19: IMPRESSION: Acute angulated displaced fracture of the distal radius with surrounding soft tissue swelling.  CXR 06/15/18: FINDINGS: There is no edema or consolidation. Heart size and pulmonary vascularity are normal. No adenopathy. No pneumothorax. There is postoperative change in the lower cervical and  upper thoracic spine regions. IMPRESSION: No edema or consolidation.   EKG: Sinus rhythm Borderline prolonged QT interval (QT/QTc 441/490 ms) No significant change since last tracing Confirmed by Isla Pence [phone]) on 06/15/2018 6:51:47 PM   CV: Cardiac Event Monitor 08/03/17-08/09/17: Study Highlights:  Telemetry tracings show normal sinus rhythm and sinus tachycardia  No signficant arrhythmias.  No symptoms reported   Echo 12/26/16: Study Conclusions - Left ventricle: The cavity size was normal. Wall thickness was   increased in a pattern of mild LVH. Systolic function was   vigorous. The estimated ejection fraction was in the range of 65%   to 70%. Wall motion was normal; there were no regional wall   motion abnormalities. The study is not technically sufficient to   allow evaluation of LV diastolic function. - Aortic valve: Mildly calcified annulus. Trileaflet; mildly   calcified leaflets. - Mitral valve: Calcified annulus. There was trivial regurgitation. - Right atrium: Central venous pressure (est): 3 mm Hg. - Atrial septum: No defect or patent foramen ovale was identified. - Tricuspid valve: There was trivial regurgitation. - Pulmonary arteries: Systolic pressure could not be accurately   estimated. - Pericardium, extracardiac: There was no pericardial effusion. Impressions: - Mild LVH with LVEF 65-70%, no regional wall motion abnormalities.   Indeterminate diastolic function. Mildly calcified mitral annulus   with trivial mitral regurgitation. Mildly calcified aortic valve.   Trivial tricuspid regurgitation.  Nuclear stress test 06/01/13 ([facility]): Conclusions: 1.  Normal regadenoson myocardial perfusion with Tc-99 mm sestamibi imaging. 2.  Stress testing induced a nondiagnostic diagnostic ECG response. 3.  This is a low  risk study. 4.  Global left ventricular systolic function was normal with an EF of 70%.  RHC/LHC 12/13/10: CONCLUSION: 1.  Borderline elevation of right heart pressures without significant     pulmonary hypertension. 2. No evidence of either aortic or mitral valve gradient. 3. Somewhat low saturations in the superior vena cava and pulmonary     artery rechecked during the procedure. 4. No significant coronary obstruction.   Past Medical History:  Diagnosis Date  . Acute renal failure superimposed on stage 3 chronic kidney disease ([place]) 01/21/2016  . B12 deficiency   . Cellulitis and abscess    Abdomen and buttocks  . CKD (chronic kidney disease) stage 3, GFR 30-59 ml/min (HCC)   . Closed fracture of distal end of femur, unspecified fracture morphology, initial encounter ([place])   . Depression   . Diabetic neuropathy ([place])   . DJD (degenerative joint disease)    Right forminal stenosis C4-5  . Essential hypertension   . Femur fracture, right ([place]) 03/30/2017  . Folliculitis   . Gout   . Headache(784.0)   . Hiatal hernia   . History of bronchitis   . History of cardiac catheterization    No significant CAD 2012  . Insomnia   . Irritable bowel syndrome   . Mixed hyperlipidemia   . Osteoarthritis   . Palpitations   . Reflux esophagitis   . Tinnitus    Right  . Type 2 diabetes mellitus [facility])     Past Surgical History:  Procedure Laterality Date  . c-spine surgery     03/2007  . CARDIAC CATHETERIZATION    . CHOLECYSTECTOMY    . COLONOSCOPY  04/21/2011; 05/29/11   6/12: [facility] - ?AVM at IC valve, inflammatory changes at [facility] valve [facility]); 7/12 - gessner; IC valve erosions, look chronic and probably Crohn's per path  . colonoscopy  2017   [place]: random biopsies normal. TI normal  . ESOPHAGOGASTRODUODENOSCOPY  05/29/11   normal  . ESOPHAGOGASTRODUODENOSCOPY N/A 01/21/2016   Dr. Michail Sermon: normal  . FEMUR IM NAIL Right 04/10/2018   Procedure: INTRAMEDULLARY (IM) NAIL FEMORAL;  Surgeon: Paralee Cancel, MD;  [facility];  Service: Orthopedics;  Laterality: Right;  . HARDWARE REMOVAL  Right 04/10/2018   Procedure: HARDWARE REMOVAL RIGHT DISTAL FEMUR;  Surgeon: Paralee Cancel, MD;  [facility];  Service: Orthopedics;  Laterality: Right;  . IR FLUORO GUIDE CV LINE RIGHT  04/01/2017  . IR US GUIDE VASC ACCESS RIGHT  04/01/2017  . JOINT REPLACEMENT    . ORIF FEMUR FRACTURE Right 03/31/2017   Procedure: OPEN REDUCTION INTERNAL FIXATION (ORIF) DISTAL FEMUR FRACTURE;  Surgeon: Paralee Cancel, MD;  Location: [place];  Service: Orthopedics;  Laterality: Right;  . Right knee arthroscopic surgery    . TOTAL ABDOMINAL HYSTERECTOMY      MEDICATIONS: . Derrill Memo ON 05/28/2019] ceFAZolin (ANCEF) IVPB 2g/100 mL premix   . allopurinol (ZYLOPRIM) 100 MG tablet  . Ascorbic Acid (VITAMIN C WITH ROSE HIPS) 500 MG tablet  . aspirin EC 81 MG tablet  . atorvastatin (LIPITOR) 80 MG tablet  . Cholecalciferol (VITAMIN D) 2000 units CAPS  . citalopram (CELEXA) 20 MG tablet  . cyanocobalamin (,VITAMIN B-12,) 1000 MCG/ML injection  . diazepam (VALIUM) 5 MG tablet  . esomeprazole (NEXIUM) 40 MG capsule  . furosemide (LASIX) 40 MG tablet  . HUMALOG KWIKPEN 100 UNIT/ML KiwkPen  . hyoscyamine (LEVBID) 0.375 MG 12 hr tablet  . indomethacin (INDOCIN) 50 MG capsule  . Insulin Detemir (  LEVEMIR FLEXTOUCH) 100 UNIT/ML Pen  . Insulin Lispro (HUMALOG KWIKPEN [place])  . Melatonin 10 MG TABS  . metoprolol (LOPRESSOR) 100 MG tablet  . ondansetron (ZOFRAN) 4 MG tablet  . oxyCODONE-acetaminophen (PERCOCET) 5-325 MG tablet  . oxyCODONE-acetaminophen (PERCOCET) 5-325 MG tablet  . OZEMPIC, 0.25 OR 0.5 MG/DOSE, 2 MG/1.5ML SOPN  . potassium chloride SA (K-DUR,KLOR-CON) 20 MEQ tablet  . Semaglutide, 1 MG/DOSE, (OZEMPIC, 1 MG/DOSE,) 2 MG/1.5ML SOPN  . tiZANidine (ZANAFLEX) 4 MG tablet     Myra Gianotti, PA-C Surgical Short Stay/Anesthesiology [facility] Phone [phone] [facility] Phone [phone] 05/27/2019 12:30 PM

## 2019-05-27 NOTE — Progress Notes (Signed)
Patient denies shortness of breath, fever, cough and chest pain.  PCP - Dr Lenord Fellers, [place] Cardiologist - Dr Cornelia Copa- Dr Silvano Rusk Nephrology - Dr Fran Lowes  Chest x-ray - 06/15/18 EKG - 06/16/18 Stress Test - yes, yrs ago, unknown location ECHO - 2018 Cardiac Cath - 2012  Fasting Blood Sugar - 120s Checks Blood Sugar ___3__ times a day  WHAT DO I DO ABOUT MY DIABETES MEDICATION?  . THE NIGHT BEFORE SURGERY, take ___20 units Levemir insulin.     . If your CBG is greater than 220 mg/dL, you may take  of your sliding scale (correction) dose of  Humaloginsulin.  o Treat a low blood sugar (less than 70 mg/dL) with  cup of clear juice (cranberry or apple), 4 glucose tablets, OR glucose gel. o Recheck blood sugar in 15 minutes after treatment (to make sure it is greater than 70 mg/dL). If your blood sugar is not greater than 70 mg/dL on recheck, call [phone] for further instructions. . Report your blood sugar to the short stay nurse when you get to Short Stay.  ERAS: Clears til 7 am DOS,  No drinik.  Aspirin Instructions: Last dose 05/18/19  Anesthesia review: Yes - Ebony Hail  STOP now taking any Aspirin (unless otherwise instructed by your surgeon), Aleve, Naproxen, Ibuprofen, Motrin, Advil, Goody's, BC's, all herbal medications, fish oil, and all vitamins.  Coronavirus Screening Have you or husband experienced the following symptoms:  Cough yes/no: No Fever (>100.27F)  yes/no: No Runny nose yes/no: No Sore throat yes/no: No Difficulty breathing/shortness of breath  yes/no: No  Have you or your husband traveled in the last 14 days and where? yes/no: No

## 2019-05-27 NOTE — Anesthesia Preprocedure Evaluation (Addendum)
Anesthesia Evaluation  Patient identified by MRN, date of birth, ID band Patient awake    Reviewed: Allergy & Precautions, NPO status , Patient's Chart, lab work & pertinent test results  History of Anesthesia Complications Negative for: history of anesthetic complications  Airway Mallampati: II  TM Distance: >3 FB Neck ROM: Full    Dental   Pulmonary neg pulmonary ROS,    Pulmonary exam normal        Cardiovascular hypertension, Normal cardiovascular exam     Neuro/Psych PSYCHIATRIC DISORDERS Anxiety Depression negative neurological ROS     GI/Hepatic Neg liver ROS, hiatal hernia, GERD  ,  Endo/Other  diabetes, Type 2, Insulin Dependent  Renal/GU Renal InsufficiencyRenal disease  negative genitourinary   Musculoskeletal negative musculoskeletal ROS (+)   Abdominal   Peds  Hematology negative hematology ROS (+)   Anesthesia Other Findings Echo 2018:  Impressions: - Mild LVH with LVEF 65-70%, no regional wall motion abnormalities. Indeterminate diastolic function. Mildly calcified mitral annulus with trivial mitral regurgitation. Mildly calcified aortic valve. Trivial tricuspid regurgitation.  Reproductive/Obstetrics                           Anesthesia Physical Anesthesia Plan  ASA: III  Anesthesia Plan: MAC and Regional   Post-op Pain Management:  Regional for Post-op pain   Induction:   PONV Risk Score and Plan: 2 and Propofol infusion, TIVA and Treatment may vary due to age or medical condition  Airway Management Planned: Nasal Cannula and Simple Face Mask  Additional Equipment: None  Intra-op Plan:   Post-operative Plan:   Informed Consent: I have reviewed the patients History and Physical, chart, labs and discussed the procedure including the risks, benefits and alternatives for the proposed anesthesia with the patient or authorized representative who has indicated  his/her understanding and acceptance.       Plan Discussed with:   Anesthesia Plan Comments: (PAT note written 05/27/2019 by Myra Gianotti, PA-C. )      Anesthesia Quick Evaluation

## 2019-05-28 ENCOUNTER — Ambulatory Visit (HOSPITAL_COMMUNITY)
Admission: RE | Admit: 2019-05-28 | Discharge: 2019-05-28 | Disposition: A | Discharge status: Home / Self Care | Payer: Medicare Other | Attending: Orthopaedic Surgery | Admitting: Orthopaedic Surgery

## 2019-05-28 ENCOUNTER — Encounter (HOSPITAL_COMMUNITY)
Admission: RE | Disposition: A | Discharge status: Home / Self Care | Payer: Self-pay | Source: Home / Self Care | Attending: Orthopaedic Surgery

## 2019-05-28 ENCOUNTER — Ambulatory Visit (HOSPITAL_COMMUNITY): Payer: Medicare Other | Admitting: Vascular Surgery

## 2019-05-28 ENCOUNTER — Encounter (HOSPITAL_COMMUNITY): Payer: Self-pay

## 2019-05-28 DIAGNOSIS — G4700 Insomnia, unspecified: Secondary | ICD-10-CM | POA: Insufficient documentation

## 2019-05-28 DIAGNOSIS — M1990 Unspecified osteoarthritis, unspecified site: Secondary | ICD-10-CM | POA: Diagnosis not present

## 2019-05-28 DIAGNOSIS — K449 Diaphragmatic hernia without obstruction or gangrene: Secondary | ICD-10-CM | POA: Insufficient documentation

## 2019-05-28 DIAGNOSIS — F329 Major depressive disorder, single episode, unspecified: Secondary | ICD-10-CM | POA: Insufficient documentation

## 2019-05-28 DIAGNOSIS — Z885 Allergy status to narcotic agent status: Secondary | ICD-10-CM | POA: Insufficient documentation

## 2019-05-28 DIAGNOSIS — W19XXXA Unspecified fall, initial encounter: Secondary | ICD-10-CM | POA: Diagnosis not present

## 2019-05-28 DIAGNOSIS — Z800 Family history of malignant neoplasm of digestive organs: Secondary | ICD-10-CM | POA: Insufficient documentation

## 2019-05-28 DIAGNOSIS — M109 Gout, unspecified: Secondary | ICD-10-CM | POA: Insufficient documentation

## 2019-05-28 DIAGNOSIS — F419 Anxiety disorder, unspecified: Secondary | ICD-10-CM | POA: Diagnosis not present

## 2019-05-28 DIAGNOSIS — Z87442 Personal history of urinary calculi: Secondary | ICD-10-CM | POA: Diagnosis not present

## 2019-05-28 DIAGNOSIS — Z90710 Acquired absence of both cervix and uterus: Secondary | ICD-10-CM | POA: Insufficient documentation

## 2019-05-28 DIAGNOSIS — Z833 Family history of diabetes mellitus: Secondary | ICD-10-CM | POA: Diagnosis not present

## 2019-05-28 DIAGNOSIS — S52551A Other extraarticular fracture of lower end of right radius, initial encounter for closed fracture: Secondary | ICD-10-CM | POA: Insufficient documentation

## 2019-05-28 DIAGNOSIS — Z882 Allergy status to sulfonamides status: Secondary | ICD-10-CM | POA: Diagnosis not present

## 2019-05-28 DIAGNOSIS — Z794 Long term (current) use of insulin: Secondary | ICD-10-CM | POA: Insufficient documentation

## 2019-05-28 DIAGNOSIS — E1140 Type 2 diabetes mellitus with diabetic neuropathy, unspecified: Secondary | ICD-10-CM | POA: Insufficient documentation

## 2019-05-28 DIAGNOSIS — E1122 Type 2 diabetes mellitus with diabetic chronic kidney disease: Secondary | ICD-10-CM | POA: Diagnosis not present

## 2019-05-28 DIAGNOSIS — R51 Headache: Secondary | ICD-10-CM | POA: Diagnosis not present

## 2019-05-28 DIAGNOSIS — Z881 Allergy status to other antibiotic agents status: Secondary | ICD-10-CM | POA: Insufficient documentation

## 2019-05-28 DIAGNOSIS — E782 Mixed hyperlipidemia: Secondary | ICD-10-CM | POA: Diagnosis not present

## 2019-05-28 DIAGNOSIS — K219 Gastro-esophageal reflux disease without esophagitis: Secondary | ICD-10-CM | POA: Insufficient documentation

## 2019-05-28 DIAGNOSIS — Z888 Allergy status to other drugs, medicaments and biological substances status: Secondary | ICD-10-CM | POA: Diagnosis not present

## 2019-05-28 DIAGNOSIS — Z79899 Other long term (current) drug therapy: Secondary | ICD-10-CM | POA: Diagnosis not present

## 2019-05-28 DIAGNOSIS — N183 Chronic kidney disease, stage 3 (moderate): Secondary | ICD-10-CM | POA: Insufficient documentation

## 2019-05-28 DIAGNOSIS — Z91048 Other nonmedicinal substance allergy status: Secondary | ICD-10-CM | POA: Insufficient documentation

## 2019-05-28 DIAGNOSIS — K589 Irritable bowel syndrome without diarrhea: Secondary | ICD-10-CM | POA: Insufficient documentation

## 2019-05-28 DIAGNOSIS — Z7982 Long term (current) use of aspirin: Secondary | ICD-10-CM | POA: Diagnosis not present

## 2019-05-28 DIAGNOSIS — Z9049 Acquired absence of other specified parts of digestive tract: Secondary | ICD-10-CM | POA: Insufficient documentation

## 2019-05-28 HISTORY — DX: Anxiety disorder, unspecified: F41.9

## 2019-05-28 HISTORY — PX: OPEN REDUCTION INTERNAL FIXATION (ORIF) DISTAL RADIAL FRACTURE: SHX5989

## 2019-05-28 HISTORY — DX: Gastro-esophageal reflux disease without esophagitis: K21.9

## 2019-05-28 HISTORY — DX: Anemia, unspecified: D64.9

## 2019-05-28 HISTORY — DX: Personal history of urinary calculi: Z87.442

## 2019-05-28 HISTORY — DX: Personal history of other medical treatment: Z92.89

## 2019-05-28 LAB — GLUCOSE, CAPILLARY
Glucose-Capillary: 122 mg/dL — ABNORMAL HIGH (ref 70–99)
Glucose-Capillary: 119 mg/dL — ABNORMAL HIGH (ref 70–99)
Glucose-Capillary: 133 mg/dL — ABNORMAL HIGH (ref 70–99)

## 2019-05-28 LAB — CBC
HCT: 46.6 % — ABNORMAL HIGH (ref 36.0–46.0)
Hemoglobin: 13.9 g/dL (ref 12.0–15.0)
MCH: 30.9 pg (ref 26.0–34.0)
MCHC: 29.8 g/dL — ABNORMAL LOW (ref 30.0–36.0)
MCV: 103.6 fL — ABNORMAL HIGH (ref 80.0–100.0)
Platelets: 222 10*3/uL (ref 150–400)
RBC: 4.5 MIL/uL (ref 3.87–5.11)
RDW: 15.1 % (ref 11.5–15.5)
WBC: 8.4 10*3/uL (ref 4.0–10.5)
nRBC: 0 % (ref 0.0–0.2)

## 2019-05-28 LAB — BASIC METABOLIC PANEL
Anion gap: 17 — ABNORMAL HIGH (ref 5–15)
BUN: 70 mg/dL — ABNORMAL HIGH (ref 8–23)
CO2: 18 mmol/L — ABNORMAL LOW (ref 22–32)
Calcium: 10.1 mg/dL (ref 8.9–10.3)
Chloride: 104 mmol/L (ref 98–111)
Creatinine, Ser: 2.59 mg/dL — ABNORMAL HIGH (ref 0.44–1.00)
GFR calc Af Amer: 22 mL/min — ABNORMAL LOW (ref >60)
GFR calc non Af Amer: 19 mL/min — ABNORMAL LOW (ref >60)
Glucose, Bld: 130 mg/dL — ABNORMAL HIGH (ref 70–99)
Potassium: 3.9 mmol/L (ref 3.5–5.1)
Sodium: 139 mmol/L (ref 135–145)

## 2019-05-28 LAB — SURGICAL PCR SCREEN
MRSA, PCR: POSITIVE — AB
Staphylococcus aureus: POSITIVE — AB

## 2019-05-28 SURGERY — OPEN REDUCTION INTERNAL FIXATION (ORIF) DISTAL RADIUS FRACTURE
Anesthesia: Monitor Anesthesia Care | Site: Arm Lower | Laterality: Right

## 2019-05-28 MED ORDER — PHENYLEPHRINE 40 MCG/ML (10ML) SYRINGE FOR IV PUSH (FOR BLOOD PRESSURE SUPPORT)
PREFILLED_SYRINGE | INTRAVENOUS | Status: DC | PRN
  Administered 2019-05-28 (×3): 80 ug via INTRAVENOUS

## 2019-05-28 MED ORDER — ONDANSETRON HCL 4 MG/2ML IJ SOLN: 4.0000 mg | Freq: Once | INTRAMUSCULAR | Status: DC | PRN

## 2019-05-28 MED ORDER — ROPIVACAINE HCL 5 MG/ML IJ SOLN
INTRAMUSCULAR | Status: DC | PRN
  Administered 2019-05-28: 30 mL via PERINEURAL

## 2019-05-28 MED ORDER — LACTATED RINGERS IV SOLN
INTRAVENOUS | Status: DC
  Administered 2019-05-28: 10:00:00 via INTRAVENOUS

## 2019-05-28 MED ORDER — POVIDONE-IODINE 10 % EX SWAB
2.0000 "application " | Freq: Once | CUTANEOUS | Status: AC
  Administered 2019-05-28: 2 via TOPICAL

## 2019-05-28 MED ORDER — 0.9 % SODIUM CHLORIDE (POUR BTL) OPTIME
TOPICAL | Status: DC | PRN
  Administered 2019-05-28: 11:00:00 1000 mL

## 2019-05-28 MED ORDER — MIDAZOLAM HCL 2 MG/2ML IJ SOLN
INTRAMUSCULAR | Status: AC
  Administered 2019-05-28: 2 mg
  Filled 2019-05-28: qty 2

## 2019-05-28 MED ORDER — OXYCODONE HCL 5 MG PO TABS: 5.0000 mg | ORAL_TABLET | Freq: Once | ORAL | Status: DC | PRN

## 2019-05-28 MED ORDER — DEXAMETHASONE SODIUM PHOSPHATE 10 MG/ML IJ SOLN
INTRAMUSCULAR | Status: AC
  Filled 2019-05-28: qty 1

## 2019-05-28 MED ORDER — ONDANSETRON HCL 4 MG/2ML IJ SOLN
INTRAMUSCULAR | Status: DC | PRN
  Administered 2019-05-28: 4 mg via INTRAVENOUS

## 2019-05-28 MED ORDER — METOPROLOL TARTRATE 50 MG PO TABS
100.0000 mg | ORAL_TABLET | Freq: Once | ORAL | Status: AC
  Administered 2019-05-28: 100 mg via ORAL

## 2019-05-28 MED ORDER — LACTATED RINGERS IV SOLN
INTRAVENOUS | Status: DC | PRN
  Administered 2019-05-28: 10:00:00 via INTRAVENOUS

## 2019-05-28 MED ORDER — ENSURE PRE-SURGERY PO LIQD: 296.0000 mL | Freq: Once | ORAL | Status: DC

## 2019-05-28 MED ORDER — LIDOCAINE 2% (20 MG/ML) 5 ML SYRINGE
INTRAMUSCULAR | Status: AC
  Filled 2019-05-28: qty 5

## 2019-05-28 MED ORDER — FENTANYL CITRATE (PF) 100 MCG/2ML IJ SOLN: 25.0000 ug | INTRAMUSCULAR | Status: DC | PRN

## 2019-05-28 MED ORDER — METOPROLOL TARTRATE 50 MG PO TABS
ORAL_TABLET | ORAL | Status: AC
  Filled 2019-05-28: qty 2

## 2019-05-28 MED ORDER — PROPOFOL 10 MG/ML IV BOLUS
INTRAVENOUS | Status: AC
  Filled 2019-05-28: qty 20

## 2019-05-28 MED ORDER — SUCCINYLCHOLINE CHLORIDE 200 MG/10ML IV SOSY
PREFILLED_SYRINGE | INTRAVENOUS | Status: AC
  Filled 2019-05-28: qty 10

## 2019-05-28 MED ORDER — PHENYLEPHRINE 40 MCG/ML (10ML) SYRINGE FOR IV PUSH (FOR BLOOD PRESSURE SUPPORT)
PREFILLED_SYRINGE | INTRAVENOUS | Status: AC
  Filled 2019-05-28: qty 10

## 2019-05-28 MED ORDER — FENTANYL CITRATE (PF) 250 MCG/5ML IJ SOLN
INTRAMUSCULAR | Status: AC
  Filled 2019-05-28: qty 5

## 2019-05-28 MED ORDER — ONDANSETRON HCL 4 MG/2ML IJ SOLN
INTRAMUSCULAR | Status: AC
  Filled 2019-05-28: qty 2

## 2019-05-28 MED ORDER — CHLORHEXIDINE GLUCONATE 4 % EX LIQD: 60.0000 mL | Freq: Once | CUTANEOUS | Status: DC

## 2019-05-28 MED ORDER — PROPOFOL 500 MG/50ML IV EMUL
INTRAVENOUS | Status: DC | PRN
  Administered 2019-05-28: 50 ug/kg/min via INTRAVENOUS

## 2019-05-28 MED ORDER — PROPOFOL 10 MG/ML IV BOLUS
INTRAVENOUS | Status: DC | PRN
  Administered 2019-05-28: 20 mg via INTRAVENOUS

## 2019-05-28 MED ORDER — CLONIDINE HCL (ANALGESIA) 100 MCG/ML EP SOLN
EPIDURAL | Status: DC | PRN
  Administered 2019-05-28: 100 ug

## 2019-05-28 MED ORDER — FENTANYL CITRATE (PF) 100 MCG/2ML IJ SOLN
INTRAMUSCULAR | Status: DC | PRN
  Administered 2019-05-28: 50 ug via INTRAVENOUS

## 2019-05-28 MED ORDER — FENTANYL CITRATE (PF) 100 MCG/2ML IJ SOLN
INTRAMUSCULAR | Status: AC
  Administered 2019-05-28: 100 ug
  Filled 2019-05-28: qty 2

## 2019-05-28 MED ORDER — ROCURONIUM BROMIDE 10 MG/ML (PF) SYRINGE
PREFILLED_SYRINGE | INTRAVENOUS | Status: AC
  Filled 2019-05-28: qty 10

## 2019-05-28 MED ORDER — OXYCODONE HCL 5 MG/5ML PO SOLN: 5.0000 mg | Freq: Once | ORAL | Status: DC | PRN

## 2019-05-28 SURGICAL SUPPLY — 70 items
ALCOHOL 70% 16 OZ (MISCELLANEOUS) ×1 IMPLANT
BANDAGE ELASTIC 3 VELCRO ST LF (GAUZE/BANDAGES/DRESSINGS) ×1 IMPLANT
BANDAGE ELASTIC 4 VELCRO ST LF (GAUZE/BANDAGES/DRESSINGS) ×1 IMPLANT
BIT DRILL 2.0 LNG QUCK RELEASE (BIT) IMPLANT
BIT DRILL 2.8 QUICK RELEASE (BIT) IMPLANT
BLADE CLIPPER SURG (BLADE) IMPLANT
BNDG CMPR 9X4 STRL LF SNTH (GAUZE/BANDAGES/DRESSINGS) ×2
BNDG ELASTIC 3X5.8 VLCR STR LF (GAUZE/BANDAGES/DRESSINGS) ×3 IMPLANT
BNDG ELASTIC 4X5.8 VLCR STR LF (GAUZE/BANDAGES/DRESSINGS) ×1 IMPLANT
BNDG ESMARK 4X9 LF (GAUZE/BANDAGES/DRESSINGS) ×3 IMPLANT
BNDG GAUZE ELAST 4 BULKY (GAUZE/BANDAGES/DRESSINGS) ×3 IMPLANT
CORD BIPOLAR FORCEPS 12FT (ELECTRODE) ×2 IMPLANT
COVER SURGICAL LIGHT HANDLE (MISCELLANEOUS) ×2 IMPLANT
COVER WAND RF STERILE (DRAPES) ×2 IMPLANT
CUFF TOURN SGL QUICK 18X4 (TOURNIQUET CUFF) ×2 IMPLANT
CUFF TOURN SGL QUICK 24 (TOURNIQUET CUFF)
CUFF TRNQT CYL 24X4X16.5-23 (TOURNIQUET CUFF) IMPLANT
DRAPE OEC MINIVIEW 54X84 (DRAPES) IMPLANT
DRAPE U-SHAPE 47X51 STRL (DRAPES) ×2 IMPLANT
DRILL 2.0 LNG QUICK RELEASE (BIT) ×2
DRILL 2.8 QUICK RELEASE (BIT) ×2
DRSG XEROFORM 1X8 (GAUZE/BANDAGES/DRESSINGS) ×2 IMPLANT
GAUZE SPONGE 4X4 12PLY STRL (GAUZE/BANDAGES/DRESSINGS) ×1 IMPLANT
GAUZE SPONGE 4X4 12PLY STRL LF (GAUZE/BANDAGES/DRESSINGS) ×1 IMPLANT
GLOVE BIOGEL PI IND STRL 8 (GLOVE) ×1 IMPLANT
GLOVE BIOGEL PI INDICATOR 8 (GLOVE)
GLOVE SURG SYN 7.5  E (GLOVE) ×1
GLOVE SURG SYN 7.5 E (GLOVE) ×1 IMPLANT
GLOVE SURG SYN 7.5 PF PI (GLOVE) ×1 IMPLANT
GOWN STRL REUS W/ TWL LRG LVL3 (GOWN DISPOSABLE) ×2 IMPLANT
GOWN STRL REUS W/TWL LRG LVL3 (GOWN DISPOSABLE) ×4
GUIDEWIRE ORTHO 0.054X6 (WIRE) ×4 IMPLANT
KIT BASIN OR (CUSTOM PROCEDURE TRAY) ×2 IMPLANT
KIT TURNOVER KIT B (KITS) ×2 IMPLANT
MANIFOLD NEPTUNE II (INSTRUMENTS) ×2 IMPLANT
NDL HYPO 25GX1X1/2 BEV (NEEDLE) IMPLANT
NEEDLE HYPO 25GX1X1/2 BEV (NEEDLE) IMPLANT
NS IRRIG 1000ML POUR BTL (IV SOLUTION) ×2 IMPLANT
PACK ORTHO EXTREMITY (CUSTOM PROCEDURE TRAY) ×2 IMPLANT
PAD ARMBOARD 7.5X6 YLW CONV (MISCELLANEOUS) ×3 IMPLANT
PAD CAST 3X4 CTTN HI CHSV (CAST SUPPLIES) IMPLANT
PAD CAST 4YDX4 CTTN HI CHSV (CAST SUPPLIES) ×1 IMPLANT
PADDING CAST COTTON 3X4 STRL (CAST SUPPLIES)
PADDING CAST COTTON 4X4 STRL (CAST SUPPLIES) ×2
PADDING CAST COTTON 6X4 STRL (CAST SUPPLIES) ×1 IMPLANT
PLATE ACULOCK 2 NARROW RT (Plate) ×1 IMPLANT
SCREW 2.3X12MM (Screw) ×1 IMPLANT
SCREW BN FT 16X2.3XLCK HEX CRT (Screw) IMPLANT
SCREW CORT 24X2.3XNONTOGGLE (Screw) IMPLANT
SCREW CORT FT 18X2.3XLCK HEX (Screw) IMPLANT
SCREW CORTICAL 2.3X24 (Screw) ×2 IMPLANT
SCREW CORTICAL LOCKING 2.3X16M (Screw) ×4 IMPLANT
SCREW CORTICAL LOCKING 2.3X18M (Screw) ×6 IMPLANT
SCREW LOCK 12X3.5X HEXALOBE (Screw) IMPLANT
SCREW LOCKING 3.5X10MM (Screw) ×1 IMPLANT
SCREW LOCKING 3.5X12 (Screw) ×2 IMPLANT
SCREW NONLOCK HEX 3.5X12 (Screw) ×1 IMPLANT
SOL PREP POV-IOD 4OZ 10% (MISCELLANEOUS) ×4 IMPLANT
SPLINT FIBERGLASS 3X35 (CAST SUPPLIES) ×2 IMPLANT
SPONGE LAP 4X18 RFD (DISPOSABLE) IMPLANT
SUT MNCRL AB 4-0 PS2 18 (SUTURE) IMPLANT
SUT PROLENE 3 0 PS 2 (SUTURE) IMPLANT
SUT PROLENE 4 0 PS 2 18 (SUTURE) ×1 IMPLANT
SUT VIC AB 3-0 FS2 27 (SUTURE) IMPLANT
SYR CONTROL 10ML LL (SYRINGE) IMPLANT
TOWEL GREEN STERILE (TOWEL DISPOSABLE) ×2 IMPLANT
TOWEL GREEN STERILE FF (TOWEL DISPOSABLE) ×2 IMPLANT
TUBE CONNECTING 12X1/4 (SUCTIONS) ×2 IMPLANT
UNDERPAD 30X30 (UNDERPADS AND DIAPERS) ×2 IMPLANT
WATER STERILE IRR 1000ML POUR (IV SOLUTION) ×1 IMPLANT

## 2019-05-28 NOTE — Anesthesia Procedure Notes (Signed)
Anesthesia Regional Block: Supraclavicular block   Pre-Anesthetic Checklist: ,, timeout performed, Correct Patient, Correct Site, Correct Laterality, Correct Procedure, Correct Position, site marked, Risks and benefits discussed,  Surgical consent,  Pre-op evaluation,  At surgeon's request and post-op pain management  Laterality: Right  Prep: chloraprep       Needles:  Injection technique: Single-shot  Needle Type: Echogenic Stimulator Needle     Needle Length: 9cm  Needle Gauge: 21     Additional Needles:   Procedures:,,,, ultrasound used (permanent image in chart),,,,  Narrative:  Start time: 05/28/2019 9:40 AM End time: 05/28/2019 9:48 AM Injection made incrementally with aspirations every 5 mL.  Performed by: Personally  Anesthesiologist: Lidia Collum, MD  Additional Notes: Monitors applied. Injection made in 5cc increments. No resistance to injection. Good needle visualization. Patient tolerated procedure well.

## 2019-05-28 NOTE — Anesthesia Procedure Notes (Addendum)
Procedure Name: MAC Date/Time: 05/28/2019 11:00 AM Performed by: Harden Mo, CRNA Pre-anesthesia Checklist: Patient identified, Emergency Drugs available, Suction available, Patient being monitored and Timeout performed Patient Re-evaluated:Patient Re-evaluated prior to induction Oxygen Delivery Method: Simple face mask Preoxygenation: Pre-oxygenation with 100% oxygen Induction Type: IV induction Placement Confirmation: positive ETCO2 and breath sounds checked- equal and bilateral Dental Injury: Teeth and Oropharynx as per pre-operative assessment

## 2019-05-28 NOTE — Discharge Instructions (Signed)
Discharge Instructions ° °- Keep dressings in place. Do not remove them. °- The dressings must stay dry °- Take all medication as prescribed. Transition to over the counter pain medication as your pain improves °- Keep the hand elevated over the next 48-72 hours to help with pain and swelling °- Move all digits not restricted by the dressings regularly to prevent stiffness °- Please call to schedule a follow up appointment with Dr. Emslee Lopezmartinez and therapy at [phone] for 10-14 days following surgery °- Your pain medication have been send digitally to your pharmacy °

## 2019-05-28 NOTE — Op Note (Signed)
PREOPERATIVE DIAGNOSIS: Displaced right extra-articular distal radius fracture  POSTOPERATIVE DIAGNOSIS: Same  ATTENDING PHYSICIAN: Maudry Mayhew. Jeannie Fend, III, MD who was present and scrubbed for the entire case   ASSISTANT SURGEON: None.   ANESTHESIA: Regional and MAC  SURGICAL PROCEDURES: Open reduction internal fixation right extra-articular distal radius fracture  SURGICAL INDICATIONS: Patient is a 68 year old female who was seen and evaluated by me in clinic.  She had a fall last week where she landed on outstretched right arm.  She was seen at outside facility where she was found to have a displaced, extra-articular right distal radius fracture.  She underwent a closed reduction at that time the on evaluation clinic she still had persistent dorsal displacement of the distal fragment.  We discussed treatment options with her including both operative and nonoperative management and she did wish to proceed with operative fixation.  FINDING: Extra-articular right distal radius fracture status post open reduction internal fixation with volar locking plate.  Near anatomic alignment was achieved.  DESCRIPTION OF PROCEDURE: Patient was identified the preoperative holding area where the risk benefits and alternatives of the procedure were discussed with the patient.  These include but not limited to infection, bleeding, damage to surrounding structures including blood vessels and nerves, pain, stiffness, malunion, nonunion, implant failure and need for additional procedures.  Informed consent was obtained that time patient's right wrist was marked with a surgical marking pen.  She then underwent a right upper extremity plexus block and then was brought to the operative suite where timeout was performed identifying the correct patient operative site.  She was positioned supine on operative table with her hand outstretched on a hand table.  She was induced under MAC sedation.  A tourniquet is placed on the  upper arm the arm was then prepped and draped in usual sterile fashion.  The limb was exsanguinated and tourniquet was inflated.  A standard FCR approach was utilized with a longitudinal incision on the volar aspect of the wrist.  Sharp dissection was carried down to the level of the FCR tendon.  The sheath above this was incised and the tendon was mobilized medially.  The underlying FCR sheath was then incised and the FPL tendon was mobilized medially.  This exposed the volar aspect of the distal radius.  The pronator quadratus was elevated sharply using both 15 blade and a wood handle elevator.  The distal radius fracture was then identified and cleaned using a rondure and curette debriding the fracture site of soft tissues.  A reduction maneuver was then performed through longitudinal traction and volar translation of the distal fragment.  Near anatomic reduction was achieved.  A 054 K wire was then placed through the radial styloid across the fracture fragment and segment holding it in place in a reduced fashion.  Fluoroscopic images were obtained which showed near anatomic alignment though with some mild dorsal angulation.  An Acumed narrow distal volar locking plate was then placed utilizing a kickstand.  It was ensured and appropriate position under fluoroscopic images.  A distal row of screws were placed with a single cortical screw followed by multiple locking screws.  The initially placed cortical screw was exchanged for a locking screw.  Once all locking screws have been failed distally the kickstand was removed and the plate was reduced to the bone proximally.  1 cortical and 2 locking screws were then placed with excellent purchase and fixation  Fluoroscopic images were then obtained which showed significant provement in the dorsal angulation  to a near anatomic alignment on both the AP and lateral planes.  Images were obtained which showed all screws were appropriate length and extra-articular nature.   This point the wound was then copiously irrigated and the tourniquet was released.  Hemostasis was achieved.  The skin was closed with interrupted 4-0 Prolene sutures.  Xeroform, 4 x 4's and a well-padded volar slab splint were then applied.  The patient was awakened from anesthesia and taken the PACU in stable condition.  She tolerated procedure well and there are no complications.  RADIOGRAPHIC INTERPRETATION: 3 views of the right wrist including AP, lateral and fossa lateral images were obtained fluoroscopically Intra-Op.  These show near-anatomic alignment of her prior extra-articular distal radius fracture with volar locking plate fixation.  No dislocations or degenerative changes are noted.  ESTIMATED BLOOD LOSS: Less than 10 mL's  TOURNIQUET TIME: 29 minutes  SPECIMENS: None  POSTOPERATIVE PLAN: The patient will be discharged home and seen back  in the office in approximately 10-12 days for wound check, suture  removal, and then be sent to a therapist for our distal radius protocol including edema control digit range of motion splint fabrication and progression to range of motion.  IMPLANTS: Acumed volar locking plate with both locking and nonlocking screws

## 2019-05-28 NOTE — Transfer of Care (Signed)
Immediate Anesthesia Transfer of Care Note  Patient: Heather [name]  Procedure(s) Performed: OPEN REDUCTION INTERNAL FIXATION (ORIF) DISTAL RADIAL FRACTURE (Right Arm Lower)  Patient Location: PACU  Anesthesia Type:MAC and Regional  Level of Consciousness: awake, alert  and oriented  Airway & Oxygen Therapy: Patient Spontanous Breathing  Post-op Assessment: Report given to RN and Post -op Vital signs reviewed and stable  Post vital signs: Reviewed and stable  Last Vitals:  Vitals Value Taken Time  BP    Temp    Pulse    Resp 10 05/28/19 1212  SpO2    Vitals shown include unvalidated device data.  Last Pain:  Vitals:   05/28/19 0839  PainSc: 9       Patients Stated Pain Goal: 3 (91/63/84 6659)  Complications: No apparent anesthesia complications

## 2019-05-28 NOTE — Progress Notes (Signed)
Orthopedic Tech Progress Note Patient Details:  Heather [name] January 29, 1951 [id number]  Ortho Devices Type of Ortho Device: Arm sling Ortho Device/Splint Location: right Ortho Device/Splint Interventions: Application   Post Interventions Patient Tolerated: Well Instructions Provided: Care of device   [place] Pink 05/28/2019, 12:44 PM

## 2019-05-28 NOTE — H&P (Signed)
ORTHOPAEDIC H&P  PCP:  [name], MD  Chief Complaint: Right distal radius fracture  HPI: Heather [name] is a 68 y.o. female who complains of a right distal radius fracture.  She had a fall earlier this week and landed on outstretched right arm.  She was seen at outside facility where she was found to have a displaced right distal radius fracture.  She underwent an initial closed reduction however on evaluation in clinic she had persistent, posterior displacement of the distal fragment.  I discussed treatment options with her including continued observation or proceeding to surgery.  After discussing the risk benefits she did wish to proceed with operative fixation presents today for that.  Past Medical History:  Diagnosis Date  . Acute renal failure superimposed on stage 3 chronic kidney disease ([place]) 01/21/2016  . Anemia   . Anxiety   . B12 deficiency   . Cellulitis and abscess    Abdomen and buttocks  . CKD (chronic kidney disease) stage 3, GFR 30-59 ml/min ([facility])   . Closed fracture of distal end of femur, unspecified fracture morphology, initial encounter ([place])   . Depression   . Diabetic neuropathy ([facility])    feet  . DJD (degenerative joint disease)    Right forminal stenosis C4-5  . Essential hypertension   . Femur fracture, right ([place]) 03/30/2017  . Folliculitis   . GERD (gastroesophageal reflux disease)   . Gout   . Headache(784.0)   . Hiatal hernia   . History of blood transfusion   . History of bronchitis   . History of cardiac catheterization    No significant CAD 2012  . History of kidney stones    surgery to remove  . Insomnia   . Irritable bowel syndrome   . Mixed hyperlipidemia   . Osteoarthritis   . Palpitations   . Reflux esophagitis   . Tinnitus    Right  . Type 2 diabetes mellitus [facility])    Past Surgical History:  Procedure Laterality Date  . c-spine surgery     03/2007  . CARDIAC CATHETERIZATION    . CHOLECYSTECTOMY    . COLONOSCOPY   04/21/2011; 05/29/11   6/12: [place] - ?AVM at IC valve, inflammatory changes at [facility] valve [facility]); 7/12 - [facility]; IC valve erosions, look chronic and probably Crohn's per path  . colonoscopy  2017   [place]: random biopsies normal. TI normal  . ESOPHAGOGASTRODUODENOSCOPY  05/29/11   normal  . ESOPHAGOGASTRODUODENOSCOPY N/A 01/21/2016   Dr. Michail Sermon: normal  . EYE SURGERY Bilateral    cataracts removed  . FEMUR IM NAIL Right 04/10/2018   Procedure: INTRAMEDULLARY (IM) NAIL FEMORAL;  Surgeon: Paralee Cancel, MD;  Location: [facility];  Service: Orthopedics;  Laterality: Right;  . HARDWARE REMOVAL Right 04/10/2018   Procedure: HARDWARE REMOVAL RIGHT DISTAL FEMUR;  Surgeon: Paralee Cancel, MD;  Location: [facility];  Service: Orthopedics;  Laterality: Right;  . IR FLUORO GUIDE CV LINE RIGHT  04/01/2017  . IR US GUIDE VASC ACCESS RIGHT  04/01/2017  . JOINT REPLACEMENT Right    hip  . ORIF FEMUR FRACTURE Right 03/31/2017   Procedure: OPEN REDUCTION INTERNAL FIXATION (ORIF) DISTAL FEMUR FRACTURE;  Surgeon: Paralee Cancel, MD;  Location: [place];  Service: Orthopedics;  Laterality: Right;  . removal of kidney stone    . Right knee arthroscopic surgery    . TONSILLECTOMY    . TOTAL ABDOMINAL HYSTERECTOMY     Social History   Socioeconomic History  .  Marital status: Married    Spouse name: Not on file  . Number of children: 2  . Years of education: Not on file  . Highest education level: Not on file  Occupational History  . Occupation: Disabled  Social Needs  . Financial resource strain: Not on file  . Food insecurity    Worry: Not on file    Inability: Not on file  . Transportation needs    Medical: Not on file    Non-medical: Not on file  Tobacco Use  . Smoking status: Never Smoker  . Smokeless tobacco: Never Used  Substance and Sexual Activity  . Alcohol use: No  . Drug use: No  . Sexual activity: Not on file    Comment: Hysterectomy  Lifestyle  . Physical activity    Days per  week: Not on file    Minutes per session: Not on file  . Stress: Not on file  Relationships  . Social Herbalist on phone: Not on file    Gets together: Not on file    Attends religious service: Not on file    Active member of club or organization: Not on file    Attends meetings of clubs or organizations: Not on file    Relationship status: Not on file  Other Topics Concern  . 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 Diabetes Mother   . Colon cancer Brother        POSSIBLY. Patient states diagnosed at age 65, then later states age 22. unclear and limited historian   Allergies  Allergen Reactions  . Duloxetine Hcl Swelling  . Fluocinolone Swelling  . Acetaminophen Itching and Swelling    Pt states "some swelling"  . Amlodipine Besylate Swelling    Swelling of feet, fluid retention  . Ciprofloxacin Nausea And Vomiting and Swelling    Swelling of face, jaw, and lips  . Hydrocodone-Acetaminophen Itching  . Metformin Other (See Comments)    REACTION: GI UPSET  . Nsaids Other (See Comments)    "HAS BLEEDING ULCERS"  . Quinolones Swelling  . Sulfamethoxazole Swelling    Swelling of feet, legs  . Sulfonamide Derivatives Swelling  . Valsartan Swelling  . Cozaar [Losartan] Swelling and Rash  . Lisinopril Swelling and Rash    Oral swelling, and red streaks on arms/stomach  . Tape Itching and Rash    Paper tape can only be used on this patient   Prior to Admission medications   Medication Sig Start Date End Date Taking? Authorizing Provider  allopurinol (ZYLOPRIM) 100 MG tablet Take 0.5 tablets (50 mg total) by mouth daily. Patient taking differently: Take 100 mg by mouth every morning.  03/13/18  Yes Barton Dubois, MD  Ascorbic Acid (VITAMIN C WITH ROSE HIPS) 500 MG tablet Take 500 mg by mouth every morning.   Yes [provider]  aspirin EC 81 MG tablet Take 81 mg by mouth every morning.    Yes [provider]  atorvastatin (LIPITOR) 80 MG tablet Take 80 mg by mouth every morning.   Yes [provider]  Cholecalciferol (VITAMIN D) 2000 units CAPS Take 2,000 Units by  mouth every morning.    Yes [provider]  citalopram (CELEXA) 20 MG tablet Take 20 mg by mouth daily.   Yes [provider]  cyanocobalamin (,VITAMIN B-12,) 1000 MCG/ML injection Inject 1,000 mcg into the muscle every 30 (thirty) days.     Yes [provider]  diazepam (VALIUM) 5 MG tablet Take 1 tablet (5 mg total) by mouth 2 (two) times daily as needed for anxiety. 04/14/18  Yes Mariel Aloe, MD  esomeprazole (NEXIUM) 40 MG capsule Take 40 mg by mouth daily before breakfast.     Yes [provider]  furosemide (LASIX) 40 MG tablet Take 1 tablet (40 mg total) by mouth daily. Patient taking differently: Take 40 mg by mouth every morning.  01/26/16  Yes Reyne Dumas, MD  hyoscyamine (LEVBID) 0.375 MG 12 hr tablet Take 1 tablet (0.375 mg total) by mouth 2 (two) times daily. Patient taking differently: Take 0.375 mg by mouth 2 (two) times daily as needed for cramping.  08/15/18  Yes Gatha Mayer, MD  indomethacin (INDOCIN) 50 MG capsule Take 50 mg by mouth 3 (three) times daily as needed.   Yes [provider]  Insulin Detemir (LEVEMIR FLEXTOUCH) 100 UNIT/ML Pen Inject 80 Units into the skin at bedtime. Patient taking differently: Inject 40 Units into the skin at bedtime. Patient adjust for her sugars 04/03/17  Yes Rosita Fire, MD  Insulin Lispro (HUMALOG KWIKPEN [place]) Inject 1-21 Units into the skin 3 (three) times daily with meals. Per home sliding scale   Yes [provider]  Melatonin 10 MG TABS Take 10 mg by mouth at bedtime as needed.    Yes  [provider]  metoprolol (LOPRESSOR) 100 MG tablet Take 100 mg by mouth 2 (two) times daily.    Yes [provider]  ondansetron (ZOFRAN) 4 MG tablet Take 1 tablet (4 mg total) by mouth every 8 (eight) hours as needed for nausea or vomiting. 06/10/18  Yes Gatha Mayer, MD  Oxycodone HCl 10 MG TABS Take 10 mg by mouth every 8 (eight) hours as needed for pain. Up to 30 days 05/13/19  Yes [provider]  oxyCODONE-acetaminophen (PERCOCET) 5-325 MG tablet Take 2 tablets by mouth every 4 (four) hours as needed. 05/24/19  Yes Pollina, Gwenyth Allegra, MD  OZEMPIC, 0.25 OR 0.5 MG/DOSE, 2 MG/1.5ML SOPN Inject 0.5 Units into the skin once a week. On Monday 07/30/18  Yes [provider]  potassium chloride SA (K-DUR,KLOR-CON) 20 MEQ tablet Take 20 mEq by mouth every morning.    Yes [provider]  tiZANidine (ZANAFLEX) 4 MG tablet Take 8 mg by mouth at bedtime.   Yes [provider]  HUMALOG KWIKPEN 100 UNIT/ML KiwkPen as directed. 07/04/18   [provider]  oxyCODONE-acetaminophen (PERCOCET) 5-325 MG tablet Take 1 tablet by mouth every 4 (four) hours as needed. Patient not taking: Reported on 05/28/2019 05/24/19   Orpah Greek, MD   No results found.  Positive ROS: All other systems have been reviewed and were otherwise negative with the exception of those mentioned in the HPI and as above.  Physical Exam: General: Alert, no acute distress Cardiovascular: No pedal edema Respiratory: No cyanosis, no use of accessory musculature Skin: No lesions in the area of chief complaint Neurologic: Sensation intact distally Psychiatric: Patient is competent for consent with normal mood and affect Lymphatic: No axillary or cervical lymphadenopathy  MUSCULOSKELETAL: Examination of the right upper extremity shows a dorsally  angulated wrist. There are no open wounds or skin tenting. There is moderate swelling about the wrist as well. There is no  erythema, lymphadenopathy or signs of infection. She is tender to palpation along the distal radius. She has intact flexion and extension throughout the digits. She has pain with attempted wrist range of motion including flexion, extension, pronation and supination. Her fingertips are warm and well perfused with brisk capillary refill. Her sensation is intact light touch throughout all digits.  Assessment: Displaced right distal radius fracture  Plan: OR today for open reduction internal fixation of right distal radius fracture.  The risks of surgery were discussed with her once again and include but are not limited to infection, bleeding, damage to surrounding structures including blood vessels and nerves, pain, stiffness, malunion, nonunion, implant failure and need for additional procedures.  Consent was obtained.  The patient's right hand was marked with surgical marking pen.  We will plan on discharge home postoperatively.  We will see her back in clinic in approximate 10 to 14 days.    Verner Mould, MD Cell [phone]   05/28/2019 10:27 AM

## 2019-05-28 NOTE — Anesthesia Postprocedure Evaluation (Signed)
Anesthesia Post Note  Patient: Heather [name]  Procedure(s) Performed: OPEN REDUCTION INTERNAL FIXATION (ORIF) DISTAL RADIAL FRACTURE (Right Arm Lower)     Patient location during evaluation: PACU Anesthesia Type: Regional Level of consciousness: awake and alert Pain management: pain level controlled Vital Signs Assessment: post-procedure vital signs reviewed and stable Respiratory status: spontaneous breathing, nonlabored ventilation and respiratory function stable Cardiovascular status: blood pressure returned to baseline and stable Postop Assessment: no apparent nausea or vomiting Anesthetic complications: no    Last Vitals:  Vitals:   05/28/19 1242 05/28/19 1245  BP: (!) 112/52 (!) 112/52  Pulse: 71 73  Resp: 16 14  Temp: 36.5 C   SpO2: 98% 98%    Last Pain:  Vitals:   05/28/19 1242  PainSc: 0-No pain                 Lidia Collum

## 2019-05-29 ENCOUNTER — Encounter (HOSPITAL_COMMUNITY): Payer: Self-pay | Admitting: Orthopaedic Surgery

## 2019-07-10 ENCOUNTER — Other Ambulatory Visit: Payer: Self-pay

## 2019-07-10 ENCOUNTER — Encounter: Payer: Self-pay | Admitting: *Deleted

## 2019-07-10 ENCOUNTER — Ambulatory Visit (INDEPENDENT_AMBULATORY_CARE_PROVIDER_SITE_OTHER): Payer: Medicare Other | Admitting: Cardiology

## 2019-07-10 ENCOUNTER — Encounter: Payer: Self-pay | Admitting: Cardiology

## 2019-07-10 VITALS — BP 108/66 | HR 84 | Temp 97.8°F | Ht 63.0 in | Wt 119.0 lb

## 2019-07-10 DIAGNOSIS — R0789 Other chest pain: Secondary | ICD-10-CM

## 2019-07-10 DIAGNOSIS — I10 Essential (primary) hypertension: Secondary | ICD-10-CM

## 2019-07-10 DIAGNOSIS — E782 Mixed hyperlipidemia: Secondary | ICD-10-CM

## 2019-07-10 DIAGNOSIS — R002 Palpitations: Secondary | ICD-10-CM

## 2019-07-10 NOTE — Progress Notes (Signed)
Clinical Summary Heather [name] is a 68 y.o.female  seen today for follow up of the following medical problems.  1. Chronic chest pain -normal cath 2003, 2005, 2012Negative nuclear stress test in 2010 and 05/2013 -11/2016 echo normal LVEF, no WMAs  - no recent chest pain.     2. Palpitations 07/2017 event monitor no arrhythmias  - has had some palpitation Lasts a few minutes at a time.    3. HTN -she is compliant with meds  4. Hyperlipidemia - on cholystermine for diarrheaby her GI doctor, not for cholesterol - labs followd by pcp  4. DM2 - per pcp - allergies to ACE-I/ARB  5. CKD - upcoming appt with nephrologist    6. IBS/Chronic N/V - followed by GI  7. Syncope - worked up at [facility] cardiology. There notes mention a nagetive cardiac monitor - no recent issues  8. Cervical radiculopathy - followed by ortho   9. Right distal radius fracture - just had surgery 05/28/19  10. Weight loss - 157 lbs in 07/2017, she relates to severe issues with IBS. PCP aware.   Past Medical History:  Diagnosis Date  . Acute renal failure superimposed on stage 3 chronic kidney disease ([place]) 01/21/2016  . Anemia   . Anxiety   . B12 deficiency   . Cellulitis and abscess    Abdomen and buttocks  . CKD (chronic kidney disease) stage 3, GFR 30-59 ml/min (HCC)   . Closed fracture of distal end of femur, unspecified fracture morphology, initial encounter ([place])   . Depression   . Diabetic neuropathy (HCC)    feet  . DJD (degenerative joint disease)    Right forminal stenosis C4-5  . Essential hypertension   . Femur fracture, right ([place]) 03/30/2017  . Folliculitis   . GERD (gastroesophageal reflux disease)   . Gout   . Headache(784.0)   . Hiatal hernia   . History of blood transfusion   . History of bronchitis   . History of cardiac catheterization    No significant CAD 2012  . History of kidney stones    surgery to remove  . Insomnia   . Irritable  bowel syndrome   . Mixed hyperlipidemia   . Osteoarthritis   . Palpitations   . Reflux esophagitis   . Tinnitus    Right  . Type 2 diabetes mellitus (HCC)      Allergies  Allergen Reactions  . Duloxetine Hcl Swelling  . Fluocinolone Swelling  . Acetaminophen Itching and Swelling    Pt states "some swelling"  . Amlodipine Besylate Swelling    Swelling of feet, fluid retention  . Ciprofloxacin Nausea And Vomiting and Swelling    Swelling of face, jaw, and lips  . Hydrocodone-Acetaminophen Itching  . Metformin Other (See Comments)    REACTION: GI UPSET  . Nsaids Other (See Comments)    "HAS BLEEDING ULCERS"  . Quinolones Swelling  . Sulfamethoxazole Swelling    Swelling of feet, legs  . Sulfonamide Derivatives Swelling  . Valsartan Swelling  . Cozaar [Losartan] Swelling and Rash  . Lisinopril Swelling and Rash    Oral swelling, and red streaks on arms/stomach  . Tape Itching and Rash    Paper tape can only be used on this patient     Current Outpatient Medications  Medication Sig Dispense Refill  . allopurinol (ZYLOPRIM) 100 MG tablet Take 0.5 tablets (50 mg total) by mouth daily. (Patient taking differently: Take 100 mg by mouth every morning. )    .  Ascorbic Acid (VITAMIN C WITH ROSE HIPS) 500 MG tablet Take 500 mg by mouth every morning.    Marland Kitchen aspirin EC 81 MG tablet Take 81 mg by mouth every morning.     Marland Kitchen atorvastatin (LIPITOR) 80 MG tablet Take 80 mg by mouth every morning.    . Cholecalciferol (VITAMIN D) 2000 units CAPS Take 2,000 Units by mouth every morning.     . citalopram (CELEXA) 20 MG tablet Take 20 mg by mouth daily.    . cyanocobalamin (,VITAMIN B-12,) 1000 MCG/ML injection Inject 1,000 mcg into the muscle every 30 (thirty) days.      . diazepam (VALIUM) 5 MG tablet Take 1 tablet (5 mg total) by mouth 2 (two) times daily as needed for anxiety. 6 tablet 0  . esomeprazole (NEXIUM) 40 MG capsule Take 40 mg by mouth daily before breakfast.      . furosemide  (LASIX) 40 MG tablet Take 1 tablet (40 mg total) by mouth daily. (Patient taking differently: Take 40 mg by mouth every morning. ) 30 tablet 0  . HUMALOG KWIKPEN 100 UNIT/ML KiwkPen as directed.  3  . hyoscyamine (LEVBID) 0.375 MG 12 hr tablet Take 1 tablet (0.375 mg total) by mouth 2 (two) times daily. (Patient taking differently: Take 0.375 mg by mouth 2 (two) times daily as needed for cramping. ) 60 tablet 0  . indomethacin (INDOCIN) 50 MG capsule Take 50 mg by mouth 3 (three) times daily as needed.    . Insulin Detemir (LEVEMIR FLEXTOUCH) 100 UNIT/ML Pen Inject 80 Units into the skin at bedtime. (Patient taking differently: Inject 40 Units into the skin at bedtime. Patient adjust for her sugars)    . Insulin Lispro (HUMALOG KWIKPEN [place]) Inject 1-21 Units into the skin 3 (three) times daily with meals. Per home sliding scale    . Melatonin 10 MG TABS Take 10 mg by mouth at bedtime as needed.     . metoprolol (LOPRESSOR) 100 MG tablet Take 100 mg by mouth 2 (two) times daily.     . ondansetron (ZOFRAN) 4 MG tablet Take 1 tablet (4 mg total) by mouth every 8 (eight) hours as needed for nausea or vomiting. 30 tablet 1  . Oxycodone HCl 10 MG TABS Take 10 mg by mouth every 8 (eight) hours as needed for pain. Up to 30 days    . oxyCODONE-acetaminophen (PERCOCET) 5-325 MG tablet Take 2 tablets by mouth every 4 (four) hours as needed. 6 tablet 0  . oxyCODONE-acetaminophen (PERCOCET) 5-325 MG tablet Take 1 tablet by mouth every 4 (four) hours as needed. (Patient not taking: Reported on 05/28/2019) 20 tablet 0  . OZEMPIC, 0.25 OR 0.5 MG/DOSE, 2 MG/1.5ML SOPN Inject 0.5 Units into the skin once a week. On Monday    . potassium chloride SA (K-DUR,KLOR-CON) 20 MEQ tablet Take 20 mEq by mouth every morning.     Marland Kitchen tiZANidine (ZANAFLEX) 4 MG tablet Take 8 mg by mouth at bedtime.     No current facility-administered medications for this visit.      Past Surgical History:  Procedure Laterality Date  . c-spine  surgery     03/2007  . CARDIAC CATHETERIZATION    . CHOLECYSTECTOMY    . COLONOSCOPY  04/21/2011; 05/29/11   6/12: [place] - ?AVM at IC valve, inflammatory changes at [facility] valve [facility]); 7/12 - [place]; IC valve erosions, look chronic and probably Crohn's per path  . colonoscopy  2017   [place]: random biopsies normal.  TI normal  . ESOPHAGOGASTRODUODENOSCOPY  05/29/11   normal  . ESOPHAGOGASTRODUODENOSCOPY N/A 01/21/2016   Dr. Michail Sermon: normal  . EYE SURGERY Bilateral    cataracts removed  . FEMUR IM NAIL Right 04/10/2018   Procedure: INTRAMEDULLARY (IM) NAIL FEMORAL;  Surgeon: Paralee Cancel, MD;  Location: [facility];  Service: Orthopedics;  Laterality: Right;  . HARDWARE REMOVAL Right 04/10/2018   Procedure: HARDWARE REMOVAL RIGHT DISTAL FEMUR;  Surgeon: Paralee Cancel, MD;  Location: [facility];  Service: Orthopedics;  Laterality: Right;  . IR FLUORO GUIDE CV LINE RIGHT  04/01/2017  . IR US GUIDE VASC ACCESS RIGHT  04/01/2017  . JOINT REPLACEMENT Right    hip  . OPEN REDUCTION INTERNAL FIXATION (ORIF) DISTAL RADIAL FRACTURE Right 05/28/2019   Procedure: OPEN REDUCTION INTERNAL FIXATION (ORIF) DISTAL RADIAL FRACTURE;  Surgeon: Verner Mould, MD;  Location: [place];  Service: Orthopedics;  Laterality: Right;  with block 90 minutes  . ORIF FEMUR FRACTURE Right 03/31/2017   Procedure: OPEN REDUCTION INTERNAL FIXATION (ORIF) DISTAL FEMUR FRACTURE;  Surgeon: Paralee Cancel, MD;  Location: [place];  Service: Orthopedics;  Laterality: Right;  . removal of kidney stone    . Right knee arthroscopic surgery    . TONSILLECTOMY    . TOTAL ABDOMINAL HYSTERECTOMY       Allergies  Allergen Reactions  . Duloxetine Hcl Swelling  . Fluocinolone Swelling  . Acetaminophen Itching and Swelling    Pt states "some swelling"  . Amlodipine Besylate Swelling    Swelling of feet, fluid retention  . Ciprofloxacin Nausea And Vomiting and Swelling    Swelling of face, jaw, and lips  .  Hydrocodone-Acetaminophen Itching  . Metformin Other (See Comments)    REACTION: GI UPSET  . Nsaids Other (See Comments)    "HAS BLEEDING ULCERS"  . Quinolones Swelling  . Sulfamethoxazole Swelling    Swelling of feet, legs  . Sulfonamide Derivatives Swelling  . Valsartan Swelling  . Cozaar [Losartan] Swelling and Rash  . Lisinopril Swelling and Rash    Oral swelling, and red streaks on arms/stomach  . Tape Itching and Rash    Paper tape can only be used on this patient      Family History  Problem Relation Age of Onset  . Diabetes Mother   . Colon cancer Brother        POSSIBLY. Patient states diagnosed at age 5, then later states age 70. unclear and limited historian     Social History Ms. Golz reports that she has never smoked. She has never used smokeless tobacco. Ms. Billard reports no history of alcohol use.   Review of Systems CONSTITUTIONAL: No weight loss, fever, chills, weakness or fatigue.  HEENT: Eyes: No visual loss, blurred vision, double vision or yellow sclerae.No hearing loss, sneezing, congestion, runny nose or sore throat.  SKIN: No rash or itching.  CARDIOVASCULAR: per hpi RESPIRATORY: No shortness of breath, cough or sputum.  GASTROINTESTINAL: No anorexia, nausea, vomiting or diarrhea. No abdominal pain or blood.  GENITOURINARY: No burning on urination, no polyuria NEUROLOGICAL: No headache, dizziness, syncope, paralysis, ataxia, numbness or tingling in the extremities. No change in bowel or bladder control.  MUSCULOSKELETAL: No muscle, back pain, joint pain or stiffness.  LYMPHATICS: No enlarged nodes. No history of splenectomy.  PSYCHIATRIC: No history of depression or anxiety.  ENDOCRINOLOGIC: No reports of sweating, cold or heat intolerance. No polyuria or polydipsia.  Marland Kitchen   Physical Examination Today's Vitals   07/10/19 1509  BP: 108/66  Pulse: 84  Temp: 97.8 F (36.6 C)  SpO2: 99%  Weight: 119 lb (54 kg)  Height: 5\' 3"  (1.6 m)    Body mass index is 21.08 kg/m.  Gen: resting comfortably, no acute distress HEENT: no scleral icterus, pupils equal round and reactive, no palptable cervical adenopathy,  CV: RRR, no m/r/g, no jvd Resp: Clear to auscultation bilaterally GI: abdomen is soft, non-tender, non-distended, normal bowel sounds, no hepatosplenomegaly MSK: extremities are warm, no edema.  Skin: warm, no rash Neuro:  no focal deficits Psych: appropriate affect   Diagnostic Studies Cath 2005: normal coronaries  05/2013 nuclear stress: no ischemia  04/2017 echo Study Conclusions  - Left ventricle: The cavity size was normal. There was mild concentric hypertrophy. Systolic function was normal. The estimated ejection fraction was in the range of 55% to 60%. Wall motion was normal; there were no regional wall motion abnormalities. Doppler parameters are consistent with abnormal left ventricular relaxation (grade 1 diastolic dysfunction). Borderline evidence of increased filling pressures. - Aortic valve: Trileaflet; mildly thickened leaflets. No significant regurgitation. Mean gradient: 64mm Hg (S). Valve area: 2.38cm^2 (Vmax). - Mitral valve: Mild regurgitation. Valve area by continuity equation (using LVOT flow): 4.83cm^2. - Left atrium: The atrium was mildly dilated. - Right atrium: Central venous pressure: 13mm Hg (est). - Tricuspid valve: Mild regurgitation. - Pulmonary arteries: PA peak pressure: 40mm Hg (S). - Pericardium, extracardiac: There was no pericardial effusion. Impressions:  - Unable to compare with previous study from December 2012. There is mild LVH with LVEF 06-30%, grade 1 diastolic dysfunction and borderline evidence of increased filling pressures. Mild left atrial enlargement. Mild mitral regurgitation. Mild tricuspid regurgitation with PASP 32 mm mercury and normal CVP. Transthoracic echocardiography. M-mode, complete 2D, spectral Doppler, and  color Doppler. Height: Height: 157.5cm. Height: 62in. Weight: Weight: 77.1kg. Weight: 169.6lb. Body mass index: BMI: 31.1kg/m^2. Body surface area: BSA: 1.83m^2. Blood pressure: 150/85. Patient status: Outpatient.   11/2016 echo Study Conclusions  - Left ventricle: The cavity size was normal. Wall thickness was increased in a pattern of mild LVH. Systolic function was vigorous. The estimated ejection fraction was in the range of 65% to 70%. Wall motion was normal; there were no regional wall motion abnormalities. The study is not technically sufficient to allow evaluation of LV diastolic function. - Aortic valve: Mildly calcified annulus. Trileaflet; mildly calcified leaflets. - Mitral valve: Calcified annulus. There was trivial regurgitation. - Right atrium: Central venous pressure (est): 3 mm Hg. - Atrial septum: No defect or patent foramen ovale was identified. - Tricuspid valve: There was trivial regurgitation. - Pulmonary arteries: Systolic pressure could not be accurately estimated. - Pericardium, extracardiac: There was no pericardial effusion.  Impressions:  - Mild LVH with LVEF 65-70%, no regional wall motion abnormalities. Indeterminate diastolic function. Mildly calcified mitral annulus with trivial mitral regurgitation. Mildly calcified aortic valve. Trivial tricuspid regurgitation.  11/2010 cath HEMODYNAMIC DATA: 1. Right atrial pressure 9-11. 2. RV 32/9-12. 3. Pulmonary artery 32/18, mean 24. 4. Pulmonary capillary wedge 17. 5. Aortic 111/60, mean 81. 6. LV 102/17. 7. No gradient pullback across aortic valve. 8. No mitral valve gradient and simultaneous pressure measurements. 9. Superior vena cava saturation 52%. 10.Pulmonary artery saturation 52%. 11.Aortic saturation 92%. 12.Fick cardiac output 4.7 liters per minute. 13.Fick cardiac index 2.6 liters per minute per meter squared. 14.Thermodilution cardiac output  3.9 liters per minute. 15.Thermodilution cardiac index 2.1 liters per minute per meter squared.  ANGIOGRAPHIC DATA: 1. The left main coronary artery  is free of critical disease. 2. The LAD courses towards the apex. There are twin vessels down over the anteroseptal, anterolateral segment. One is the diagonal, the other is likely the LAD with multiple septal perforators. This vessel was smooth throughout without significant focal narrowing. 3. The circumflex provides a large marginal Deriona Altemose and a smaller AV circumflex, both of which appear free of critical disease. 4. The right coronary artery is moderately large vessel providing posterior descending and posterolateral Carmellia Kreisler. There may be some minor plaquing at the bifurcation of the PLA, PDA, but no significant areas of high-grade focal stenosis noted.  CONCLUSION: 1. Borderline elevation of right heart pressures without significant pulmonary hypertension. 2. No evidence of either aortic or mitral valve gradient. 3. Somewhat low saturations in the superior vena cava and pulmonary artery rechecked during the procedure. 4. No significant coronary obstruction.   07/2017 event monitor  Telemetry tracings show normal sinus rhythm and sinus tachycardia  No signficant arrhythmias.  No symptoms reported    Assessment and Plan  1. Chest pain - long history, negative ischemic workups in the past - no recent symptoms, conitnue to monitor.  - EKG shows NSR  2. Palpitations - previous benign monitor - mild symptoms at times, continue to monitor at this time.   3. HTN - at goal, continue current meds  4. Hyperlipidemia -request pcp labs   F/u 1 year    Arnoldo Lenis, M.D.,

## 2019-07-10 NOTE — Patient Instructions (Signed)
Your physician wants you to follow-up in: 1 YEAR WITH DR BRANCH You will receive a reminder letter in the mail two months in advance. If you don't receive a letter, please call our office to schedule the follow-up appointment.  Your physician recommends that you continue on your current medications as directed. Please refer to the Current Medication list given to you today.  Thank you for choosing [facility]!!    

## 2019-09-18 DIAGNOSIS — R338 Other retention of urine: Secondary | ICD-10-CM | POA: Insufficient documentation

## 2019-09-18 DIAGNOSIS — D62 Acute posthemorrhagic anemia: Secondary | ICD-10-CM | POA: Insufficient documentation

## 2019-09-18 DIAGNOSIS — N9989 Other postprocedural complications and disorders of genitourinary system: Secondary | ICD-10-CM | POA: Insufficient documentation

## 2019-09-22 DIAGNOSIS — M5416 Radiculopathy, lumbar region: Secondary | ICD-10-CM | POA: Insufficient documentation

## 2019-09-22 DIAGNOSIS — M25552 Pain in left hip: Secondary | ICD-10-CM | POA: Insufficient documentation

## 2019-10-27 ENCOUNTER — Emergency Department (HOSPITAL_COMMUNITY): Payer: Medicare Other

## 2019-10-27 ENCOUNTER — Encounter (HOSPITAL_COMMUNITY): Payer: Self-pay | Admitting: Emergency Medicine

## 2019-10-27 ENCOUNTER — Emergency Department (HOSPITAL_COMMUNITY)
Admission: EM | Admit: 2019-10-27 | Discharge: 2019-10-27 | Disposition: A | Discharge status: Home / Self Care | Payer: Medicare Other | Attending: Emergency Medicine | Admitting: Emergency Medicine

## 2019-10-27 ENCOUNTER — Other Ambulatory Visit: Payer: Self-pay

## 2019-10-27 DIAGNOSIS — E1122 Type 2 diabetes mellitus with diabetic chronic kidney disease: Secondary | ICD-10-CM | POA: Insufficient documentation

## 2019-10-27 DIAGNOSIS — Z96649 Presence of unspecified artificial hip joint: Secondary | ICD-10-CM | POA: Insufficient documentation

## 2019-10-27 DIAGNOSIS — Z79899 Other long term (current) drug therapy: Secondary | ICD-10-CM | POA: Diagnosis not present

## 2019-10-27 DIAGNOSIS — Y92009 Unspecified place in unspecified non-institutional (private) residence as the place of occurrence of the external cause: Secondary | ICD-10-CM | POA: Diagnosis not present

## 2019-10-27 DIAGNOSIS — R519 Headache, unspecified: Secondary | ICD-10-CM | POA: Insufficient documentation

## 2019-10-27 DIAGNOSIS — Y939 Activity, unspecified: Secondary | ICD-10-CM | POA: Diagnosis not present

## 2019-10-27 DIAGNOSIS — Y999 Unspecified external cause status: Secondary | ICD-10-CM | POA: Insufficient documentation

## 2019-10-27 DIAGNOSIS — W010XXA Fall on same level from slipping, tripping and stumbling without subsequent striking against object, initial encounter: Secondary | ICD-10-CM | POA: Insufficient documentation

## 2019-10-27 DIAGNOSIS — Z794 Long term (current) use of insulin: Secondary | ICD-10-CM | POA: Diagnosis not present

## 2019-10-27 DIAGNOSIS — N183 Chronic kidney disease, stage 3 unspecified: Secondary | ICD-10-CM | POA: Insufficient documentation

## 2019-10-27 DIAGNOSIS — W19XXXA Unspecified fall, initial encounter: Secondary | ICD-10-CM

## 2019-10-27 DIAGNOSIS — I129 Hypertensive chronic kidney disease with stage 1 through stage 4 chronic kidney disease, or unspecified chronic kidney disease: Secondary | ICD-10-CM | POA: Insufficient documentation

## 2019-10-27 DIAGNOSIS — M25512 Pain in left shoulder: Secondary | ICD-10-CM | POA: Diagnosis present

## 2019-10-27 DIAGNOSIS — Z7982 Long term (current) use of aspirin: Secondary | ICD-10-CM | POA: Insufficient documentation

## 2019-10-27 DIAGNOSIS — R030 Elevated blood-pressure reading, without diagnosis of hypertension: Secondary | ICD-10-CM

## 2019-10-27 MED ORDER — METOPROLOL TARTRATE 50 MG PO TABS
100.0000 mg | ORAL_TABLET | Freq: Two times a day (BID) | ORAL | Status: DC
  Administered 2019-10-27: 100 mg via ORAL
  Filled 2019-10-27: qty 2

## 2019-10-27 MED ORDER — OXYCODONE HCL 5 MG PO TABS
5.0000 mg | ORAL_TABLET | Freq: Once | ORAL | Status: AC
  Administered 2019-10-27: 5 mg via ORAL
  Filled 2019-10-27: qty 1

## 2019-10-27 MED ORDER — OXYCODONE HCL 5 MG PO TABS: 5.0000 mg | ORAL_TABLET | Freq: Four times a day (QID) | ORAL | 0 refills | Status: DC | PRN

## 2019-10-27 NOTE — ED Triage Notes (Signed)
Pt reports tripped over drinks that were sitting in the floor. Pt reports hit head, landed on left arm. Pt reports back pain, left shoulder pain, fifth digit of left hand. Pt denies loc or being on blood thinners. Pt alert and oriented. Ambulatory to triage. Bruise noted to fifth digit of left hand. Pt reports had back surgery last month. nad noted.

## 2019-10-27 NOTE — Discharge Instructions (Addendum)
You were given your metoprolol for your blood pressure.  Is important you follow-up with your primary care provider for this as it was continued to be elevated today in the emergency department.  You may take the pain medicine as prescribed.  Fall.  Follow-up orthopedics if you have any new or worsening pain.

## 2019-10-27 NOTE — ED Provider Notes (Signed)
[facility] EMERGENCY DEPARTMENT Provider Note   CSN: [id number] Arrival date & time: 10/27/19  1334    History Chief Complaint  Patient presents with  . Fall   Heather [name] is a 68 y.o. female with has medical history significant for CKD, diabetes, recent spinal fusion, osteoarthritis who presents for evaluation of mechanical fall.  Patient states she tripped over some cups that her husband left on the floor yesterday evening.  Patient states she hit her head and has left shoulder and left fifth digit pain since the incident.  Denies LOC or anticoagulation.  She remembers the entire incident.  No preceding sudden onset thunderclap headache, dizziness, lightheadedness, chest pain, shortness of breath.  She has been ambulatory since the incident.  Admits to chronic back pain however this is not changed since the fall.  No IV drug use, bowel or bladder incontinence, saddle paresthesia.  Rates her current pain an 8/10 located to her left shoulder and left pinky.  Denies fever, chills, nausea, vomiting, vision changes, lightheadedness, dizziness, chest pain, shortness of breath, abdominal pain, diarrhea, dysuria, decreased range of motion, numbness or tingling to her extremities.  History of hypertension to take her medications.  She thinks her blood pressure is currently elevated due to her pain.  History obtained from patient and past medical records.  No interpreter is used.  HPI     Past Medical History:  Diagnosis Date  . Acute renal failure superimposed on stage 3 chronic kidney disease ([place]) 01/21/2016  . Anemia   . Anxiety   . B12 deficiency   . Cellulitis and abscess    Abdomen and buttocks  . CKD (chronic kidney disease) stage 3, GFR 30-59 ml/min   . Closed fracture of distal end of femur, unspecified fracture morphology, initial encounter ([place])   . Depression   . Diabetic neuropathy (HCC)    feet  . DJD (degenerative joint disease)    Right forminal stenosis C4-5  .  Essential hypertension   . Femur fracture, right ([place]) 03/30/2017  . Folliculitis   . GERD (gastroesophageal reflux disease)   . Gout   . Headache(784.0)   . Hiatal hernia   . History of blood transfusion   . History of bronchitis   . History of cardiac catheterization    No significant CAD 2012  . History of kidney stones    surgery to remove  . Insomnia   . Irritable bowel syndrome   . Mixed hyperlipidemia   . Osteoarthritis   . Palpitations   . Reflux esophagitis   . Tinnitus    Right  . Type 2 diabetes mellitus [facility])     Patient Active Problem List   Diagnosis Date Noted  . Closed right hip fracture ([place]) 04/08/2018  . Salmonella   . Elevated lipase   . Gastroenteritis   . Protein-calorie malnutrition, severe 03/05/2018  . Neuropathy 03/02/2015  . Pain in the chest   . Chest pain 12/22/2014  . Essential hypertension 12/22/2014  . Diabetes mellitus with chronic kidney disease ([place]) 12/22/2014  . Hiatal hernia 12/22/2014  . GERD (gastroesophageal reflux disease) 12/22/2014  . Gout 12/22/2014  . Mixed hyperlipidemia 12/22/2014  . Depression 12/22/2014  . Chronic kidney disease, stage III (moderate) 08/25/2014  . Vitamin B12 deficiency 07/08/2011  . Personal history of failed moderate sedation 04/16/2011  . Chronic Nausea and Vomiting - ? gastroapresis 04/16/2011  . Early satiety 04/16/2011  . Irritable bowel syndrome 04/16/2011  . Benign paroxysmal positional vertigo  11/20/2010  . TRICUSPID REGURGITATION, MODERATE 11/20/2010  . PULMONARY HYPERTENSION, MILD 11/20/2010  . LEG EDEMA, BILATERAL 11/20/2010  . DYSPNEA 11/20/2010  . NONSPECIFIC ABNORMAL ELECTROCARDIOGRAM 11/20/2010  . CHEST WALL PAIN, HX OF 11/20/2010    Past Surgical History:  Procedure Laterality Date  . BACK SURGERY    . c-spine surgery     03/2007  . CARDIAC CATHETERIZATION    . CHOLECYSTECTOMY    . COLONOSCOPY  04/21/2011; 05/29/11   6/12: [place] - ?AVM at IC valve, inflammatory changes at  [facility] valve [facility]); 7/12 - [facility]; IC valve erosions, look chronic and probably Crohn's per path  . colonoscopy  2017   [place]: random biopsies normal. TI normal  . ESOPHAGOGASTRODUODENOSCOPY  05/29/11   normal  . ESOPHAGOGASTRODUODENOSCOPY N/A 01/21/2016   Dr. Michail Sermon: normal  . EYE SURGERY Bilateral    cataracts removed  . FEMUR IM NAIL Right 04/10/2018   Procedure: INTRAMEDULLARY (IM) NAIL FEMORAL;  Surgeon: Paralee Cancel, MD;  Location: [facility];  Service: Orthopedics;  Laterality: Right;  . HARDWARE REMOVAL Right 04/10/2018   Procedure: HARDWARE REMOVAL RIGHT DISTAL FEMUR;  Surgeon: Paralee Cancel, MD;  Location: [facility];  Service: Orthopedics;  Laterality: Right;  . IR FLUORO GUIDE CV LINE RIGHT  04/01/2017  . IR US GUIDE VASC ACCESS RIGHT  04/01/2017  . JOINT REPLACEMENT Right    hip  . OPEN REDUCTION INTERNAL FIXATION (ORIF) DISTAL RADIAL FRACTURE Right 05/28/2019   Procedure: OPEN REDUCTION INTERNAL FIXATION (ORIF) DISTAL RADIAL FRACTURE;  Surgeon: Verner Mould, MD;  Location: [place];  Service: Orthopedics;  Laterality: Right;  with block 90 minutes  . ORIF FEMUR FRACTURE Right 03/31/2017   Procedure: OPEN REDUCTION INTERNAL FIXATION (ORIF) DISTAL FEMUR FRACTURE;  Surgeon: Paralee Cancel, MD;  Location: [place];  Service: Orthopedics;  Laterality: Right;  . removal of kidney stone    . Right knee arthroscopic surgery    . TONSILLECTOMY    . TOTAL ABDOMINAL HYSTERECTOMY       OB History   No obstetric history on file.     Family History  Problem Relation Age of Onset  . Diabetes Mother   . Colon cancer Brother        POSSIBLY. Patient states diagnosed at age 81, then later states age 40. unclear and limited historian    Social History   Tobacco Use  . Smoking status: Never Smoker  . Smokeless tobacco: Never Used  Substance Use Topics  . Alcohol use: No  . Drug use: No    Home Medications Prior to Admission medications   Medication Sig Start Date End  Date Taking? Authorizing Provider  allopurinol (ZYLOPRIM) 100 MG tablet Take 100 mg by mouth daily.    [provider]  Ascorbic Acid (VITAMIN C WITH ROSE HIPS) 500 MG tablet Take 500 mg by mouth every morning.    [provider]  aspirin EC 81 MG tablet Take 81 mg by mouth every morning.     [provider]  atorvastatin (LIPITOR) 80 MG tablet Take 80 mg by mouth every morning.    [provider]  Cholecalciferol (VITAMIN D) 2000 units CAPS Take 2,000 Units by mouth every morning.     [provider]  citalopram (CELEXA) 40 MG tablet Take 40 mg by mouth daily.    [provider]  cyanocobalamin (,VITAMIN B-12,) 1000 MCG/ML injection Inject 1,000 mcg into the muscle every 30 (thirty) days.      [provider]  diazepam (VALIUM) 5 MG tablet Take 1 tablet (5 mg total) by mouth 2 (two) times daily as needed for anxiety. 04/14/18   Mariel Aloe, MD  esomeprazole (NEXIUM) 40 MG capsule Take 40 mg by mouth daily before breakfast.      [provider]  furosemide (LASIX) 40 MG tablet Take 1 tablet (40 mg total) by mouth daily. Patient taking differently: Take 40 mg by mouth every morning.  01/26/16   Reyne Dumas, MD  HUMALOG KWIKPEN 100 UNIT/ML KiwkPen as directed. 07/04/18   [provider]  indomethacin (INDOCIN) 50 MG capsule Take 50 mg by mouth 3 (three) times daily as needed.    [provider]  Insulin Detemir (LEVEMIR FLEXTOUCH) 100 UNIT/ML Pen Inject 80 Units into the skin at bedtime. Patient taking differently: Inject 20 Units into the skin at bedtime. Patient adjust for her sugars 04/03/17   Rosita Fire, MD  Insulin Lispro (HUMALOG KWIKPEN [place]) Inject 1-21 Units into the skin 3 (three) times daily with meals. Per home sliding scale    [provider]  Melatonin 10 MG TABS Take 10 mg by mouth at bedtime as needed.     [provider]  metoprolol (LOPRESSOR) 100 MG tablet Take 100  mg by mouth 2 (two) times daily.     [provider]  ondansetron (ZOFRAN) 4 MG tablet Take 1 tablet (4 mg total) by mouth every 8 (eight) hours as needed for nausea or vomiting. 06/10/18   Gatha Mayer, MD  oxyCODONE (ROXICODONE) 5 MG immediate release tablet Take 1 tablet (5 mg total) by mouth every 6 (six) hours as needed for severe pain. 10/27/19   Mylinda Brook A, PA-C  OZEMPIC, 0.25 OR 0.5 MG/DOSE, 2 MG/1.5ML SOPN Inject 0.5 Units into the skin once a week. On Monday 07/30/18   [provider]  potassium chloride SA (K-DUR,KLOR-CON) 20 MEQ tablet Take 20 mEq by mouth every morning.     [provider]  tiZANidine (ZANAFLEX) 4 MG tablet Take 8 mg by mouth at bedtime.    [provider]    Allergies    Duloxetine hcl, Fluocinolone, Acetaminophen, Amlodipine besylate, Ciprofloxacin, Hydrocodone-acetaminophen, Metformin, Nsaids, Quinolones, Sulfamethoxazole, Sulfonamide derivatives, Valsartan, Cozaar [losartan], Lisinopril, and Tape  Review of Systems   Review of Systems  Constitutional: Negative.   HENT: Negative.   Respiratory: Negative.   Cardiovascular: Negative.   Gastrointestinal: Negative.   Genitourinary: Negative.   Neurological: Positive for headaches. Negative for dizziness, tremors, seizures, syncope, facial asymmetry, speech difficulty, weakness, light-headedness and numbness.  All other systems reviewed and are negative.   Physical Exam Updated Vital Signs BP (!) 205/94 (BP Location: Right Arm)   Pulse 83   Temp 99.4 F (37.4 C) (Oral)   Resp 18   Ht 5\' 3"  (1.6 m)   Wt 59 kg   LMP  (LMP Unknown)   SpO2 100%   BMI 23.03 kg/m   Physical Exam Physical Exam  Constitutional: Pt is oriented to person, place, and time. Appears well-developed and well-nourished. No distress.  HENT:  Head: Normocephalic and atraumatic.  Nose: Nose normal.  Mouth/Throat: Uvula is midline, oropharynx is clear and moist and mucous membranes are  normal.  Eyes: Conjunctivae and EOM are normal. Pupils are equal, round, and reactive to light.  Neck: No spinous process tenderness and no muscular tenderness present. No rigidity. Normal range of motion present.  Cardiovascular: Normal rate, regular rhythm and intact distal pulses.  Pulses:      Radial pulses are 2+ on the right side, and 2+ on the left side.       Dorsalis pedis pulses are 2+ on the right side, and 2+ on the left side.       Posterior tibial pulses are 2+ on the right side, and 2+ on the left side.  Pulmonary/Chest: Effort normal and breath sounds normal. No accessory muscle usage. No respiratory distress. No decreased breath sounds. No wheezes. No rhonchi. No rales. Exhibits no tenderness and no bony tenderness.  Abdominal: Soft. Normal appearance and bowel sounds are normal. There is no tenderness. There is no rigidity, no guarding and no CVA tenderness.  Musculoskeletal: Normal range of motion.       Thoracic back: Exhibits normal range of motion.       Lumbar back: Exhibits normal range of motion.  Full range of motion of the T-spine and L-spine No tenderness to palpation of the spinous processes of the T-spine or L-spine No crepitus, deformity or step-offs Mild tenderness to palpation of the paraspinous muscles of the L-spine. Chronic in nature per patient.  Tenderness to anterior left shoulder, negative, Hawkins test.  Forage motion without difficulty.  Tenderness between metacarpal and PIP at left upper extremity little finger.  No crepitus, step-offs, full range of motion without difficulty. Lymphadenopathy:    Pt has no cervical adenopathy.  Neurological: Pt is alert and oriented to person, place, and time. Normal reflexes. No cranial nerve deficit. GCS eye subscore is 4. GCS verbal subscore is 5. GCS motor subscore is 6.  Reflex Scores:      Bicep reflexes are 2+ on the right side and 2+ on the left side.      Brachioradialis reflexes are 2+ on the right side and  2+ on the left side.      Patellar reflexes are 2+ on the right side and 2+ on the left side.      Achilles reflexes are 2+ on the right side and 2+ on the left side. Speech is clear and goal oriented, follows commands Normal 5/5 strength in upper and lower extremities bilaterally including dorsiflexion and plantar flexion, strong and equal grip strength Sensation normal to light and sharp touch Moves extremities without ataxia, coordination intact Normal gait and balance No Clonus  Skin: Skin is warm and dry. No rash noted. Pt is not diaphoretic. No erythema.  Psychiatric: Normal mood and affect.  Nursing note and vitals reviewed. ED Results / Procedures / Treatments   Labs (all labs ordered are listed, but only abnormal results are displayed) Labs Reviewed - No data to display  EKG None  Radiology CT Head Wo Contrast  Result Date: 10/27/2019 CLINICAL DATA:  Fall. EXAM: CT HEAD WITHOUT CONTRAST TECHNIQUE: Contiguous axial images were obtained from the base of the skull through the vertex without intravenous contrast. COMPARISON:  CT head dated June 26, 2019. FINDINGS: Brain: No evidence of acute infarction, hemorrhage, hydrocephalus, extra-axial collection or mass lesion/mass effect. Stable mild generalized cerebral atrophy. Vascular: Calcified atherosclerosis at the skullbase. No hyperdense vessel. Skull: Normal. Negative for fracture or focal lesion. Sinuses/Orbits: No acute finding. Other: None. IMPRESSION: 1.  No acute intracranial abnormality. Electronically Signed   By: Titus Dubin M.D.   On: 10/27/2019 16:43   DG Shoulder Left  Result Date: 10/27/2019 CLINICAL DATA:  Fall EXAM: LEFT SHOULDER - 2+ VIEW COMPARISON:  None. FINDINGS: Alignment is anatomic. There is no acute fracture. Joint spaces are preserved.  Small subacromial spur. There is mild calcification along the rotator cuff insertion IMPRESSION: No acute osseous abnormality. Electronically Signed   By: Macy Mis  M.D.   On: 10/27/2019 14:41   DG Finger Little Left  Result Date: 10/27/2019 CLINICAL DATA:  Small finger pain and bruising after fall last night. EXAM: LEFT LITTLE FINGER 2+V COMPARISON:  None. FINDINGS: There is no evidence of fracture or dislocation. There is no evidence of arthropathy or other focal bone abnormality. Osteopenia. Soft tissues are unremarkable. IMPRESSION: Negative. Electronically Signed   By: Titus Dubin M.D.   On: 10/27/2019 14:50    Procedures Procedures (including critical care time)  Medications Ordered in ED Medications  metoprolol tartrate (LOPRESSOR) tablet 100 mg (has no administration in time range)  oxyCODONE (Oxy IR/ROXICODONE) immediate release tablet 5 mg (5 mg Oral Given 10/27/19 1517)    ED Course  I have reviewed the triage vital signs and the nursing notes.  Pertinent labs & imaging results that were available during my care of the patient were reviewed by me and considered in my medical decision making (see chart for details).   68 year old presents for evaluation after mechanical fall.  Tripped and fell over drinks on the floor yesterday evening.  Admits to hitting head however denies LOC, anticoagulation.  No preceding symptoms.  She was ambulatory after the incident.  Patient with a small abrasion to her left forehead however no crepitus, step-offs, tenderness.  Area does not look infected.  No midline cervical tenderness with full range of motion.  She has pain to her left anterior shoulder however has full range of motion but with tenderness with overhead movement.  Negative Hawkins, empty can.  No tenderness over clavicle, midline spine.  No shortening or rotation of legs.  She has ecchymosis and tenderness to her left upper extremity, fifth digit between her PIP and metacarpal.  Full range of motion without difficulty.  She is hypertensive in the emergency department and is on blood pressure medicine at home.  She denies headache, dizziness,  lightheadedness, nausea, vomiting, chest pain, shortness of breath, abdominal pain.  Low suspicion for hypertensive urgency or emergency.  Patient relates her hypertension to her current pain which she rates an 8/10.  She is neurovascularly intact.  Neuromusculoskeletal exam.  Plan on imaging, pain management, recheck of blood pressure and reevaluate.  She did have spinal fusion 1 month ago at [facility].  Has had chronic low back pain since then.  Patient states this has not changed.  She denies any IV drug use, bowel or bladder problems, saddle paresthesias.  No red flags for back pain.  She is ambulatory that difficulty.  Do not think she needs additional imaging at this time given this pain has been chronic since her surgery.  Afebrile, low suspicion for infection, cauqia equina, osteomyelitis, transverse myelitis.   Clinical Course as of Oct 26 1728  Tue Oct 27, 2019  1720 BP recheck manual at 192/90 on right arm. Given home BP meds as patient stated he is due for her Lopressor.  She did have appoint with her PCP last week and was started on additional blood pressure medication however she does not know the name of this.  Urged follow-up with PCP for continued recheck of her blood pressure.   [BH]    Clinical Course User Index [BH] Yaiden Yang A, PA-C    Reevaluated.  Requesting to go home.  Blood pressure still mildly elevated however patient states that her  baseline.  I have given her her home blood pressure medication.  I have low suspicion for hypertensive urgency or emergency.  Urged patient follow-up with PCP for recheck of her blood pressure.  She is ambulatory tolerating p.o. intake without difficulty.  Images do not show evidence of acute abnormality.  She will follow-up with orthopedics if she has any continued symptoms.  The patient has been appropriately medically screened and/or stabilized in the ED. I have low suspicion for any other emergent medical condition which would require  further screening, evaluation or treatment in the ED or require inpatient management.   MDM Rules/Calculators/A&P                      Final Clinical Impression(s) / ED Diagnoses Final diagnoses:  Fall, initial encounter  Acute pain of left shoulder  Elevated blood pressure reading    Rx / DC Orders ED Discharge Orders         Ordered    oxyCODONE (ROXICODONE) 5 MG immediate release tablet  Every 6 hours PRN     10/27/19 1723           Chrystine Frogge A, PA-C 10/27/19 1729    Hayden Rasmussen, MD 10/27/19 1914

## 2019-11-02 DIAGNOSIS — N390 Urinary tract infection, site not specified: Secondary | ICD-10-CM | POA: Diagnosis not present

## 2019-11-02 DIAGNOSIS — Z79891 Long term (current) use of opiate analgesic: Secondary | ICD-10-CM | POA: Diagnosis not present

## 2019-11-02 DIAGNOSIS — Z7982 Long term (current) use of aspirin: Secondary | ICD-10-CM | POA: Diagnosis not present

## 2019-11-02 DIAGNOSIS — N183 Chronic kidney disease, stage 3 unspecified: Secondary | ICD-10-CM | POA: Diagnosis not present

## 2019-11-02 DIAGNOSIS — Z9181 History of falling: Secondary | ICD-10-CM | POA: Diagnosis not present

## 2019-11-02 DIAGNOSIS — Z981 Arthrodesis status: Secondary | ICD-10-CM | POA: Diagnosis not present

## 2019-11-02 DIAGNOSIS — Z79899 Other long term (current) drug therapy: Secondary | ICD-10-CM | POA: Diagnosis not present

## 2019-11-02 DIAGNOSIS — I129 Hypertensive chronic kidney disease with stage 1 through stage 4 chronic kidney disease, or unspecified chronic kidney disease: Secondary | ICD-10-CM | POA: Diagnosis not present

## 2019-11-02 DIAGNOSIS — Z794 Long term (current) use of insulin: Secondary | ICD-10-CM | POA: Diagnosis not present

## 2019-11-02 DIAGNOSIS — R2689 Other abnormalities of gait and mobility: Secondary | ICD-10-CM | POA: Diagnosis not present

## 2019-11-02 DIAGNOSIS — M545 Low back pain: Secondary | ICD-10-CM | POA: Diagnosis not present

## 2019-11-02 DIAGNOSIS — Z4789 Encounter for other orthopedic aftercare: Secondary | ICD-10-CM | POA: Diagnosis not present

## 2019-11-02 DIAGNOSIS — E1122 Type 2 diabetes mellitus with diabetic chronic kidney disease: Secondary | ICD-10-CM | POA: Diagnosis not present

## 2019-11-03 DIAGNOSIS — Z9181 History of falling: Secondary | ICD-10-CM | POA: Diagnosis not present

## 2019-11-03 DIAGNOSIS — E1122 Type 2 diabetes mellitus with diabetic chronic kidney disease: Secondary | ICD-10-CM | POA: Diagnosis not present

## 2019-11-03 DIAGNOSIS — Z4789 Encounter for other orthopedic aftercare: Secondary | ICD-10-CM | POA: Diagnosis not present

## 2019-11-03 DIAGNOSIS — R2689 Other abnormalities of gait and mobility: Secondary | ICD-10-CM | POA: Diagnosis not present

## 2019-11-03 DIAGNOSIS — Z79899 Other long term (current) drug therapy: Secondary | ICD-10-CM | POA: Diagnosis not present

## 2019-11-03 DIAGNOSIS — Z794 Long term (current) use of insulin: Secondary | ICD-10-CM | POA: Diagnosis not present

## 2019-11-03 DIAGNOSIS — Z7982 Long term (current) use of aspirin: Secondary | ICD-10-CM | POA: Diagnosis not present

## 2019-11-03 DIAGNOSIS — M545 Low back pain: Secondary | ICD-10-CM | POA: Diagnosis not present

## 2019-11-03 DIAGNOSIS — N390 Urinary tract infection, site not specified: Secondary | ICD-10-CM | POA: Diagnosis not present

## 2019-11-03 DIAGNOSIS — N183 Chronic kidney disease, stage 3 unspecified: Secondary | ICD-10-CM | POA: Diagnosis not present

## 2019-11-03 DIAGNOSIS — Z79891 Long term (current) use of opiate analgesic: Secondary | ICD-10-CM | POA: Diagnosis not present

## 2019-11-03 DIAGNOSIS — I129 Hypertensive chronic kidney disease with stage 1 through stage 4 chronic kidney disease, or unspecified chronic kidney disease: Secondary | ICD-10-CM | POA: Diagnosis not present

## 2019-11-03 DIAGNOSIS — Z981 Arthrodesis status: Secondary | ICD-10-CM | POA: Diagnosis not present

## 2019-11-05 DIAGNOSIS — Z794 Long term (current) use of insulin: Secondary | ICD-10-CM | POA: Diagnosis not present

## 2019-11-05 DIAGNOSIS — M545 Low back pain: Secondary | ICD-10-CM | POA: Diagnosis not present

## 2019-11-05 DIAGNOSIS — N390 Urinary tract infection, site not specified: Secondary | ICD-10-CM | POA: Diagnosis not present

## 2019-11-05 DIAGNOSIS — Z9181 History of falling: Secondary | ICD-10-CM | POA: Diagnosis not present

## 2019-11-05 DIAGNOSIS — Z79891 Long term (current) use of opiate analgesic: Secondary | ICD-10-CM | POA: Diagnosis not present

## 2019-11-05 DIAGNOSIS — Z79899 Other long term (current) drug therapy: Secondary | ICD-10-CM | POA: Diagnosis not present

## 2019-11-05 DIAGNOSIS — Z7982 Long term (current) use of aspirin: Secondary | ICD-10-CM | POA: Diagnosis not present

## 2019-11-05 DIAGNOSIS — R2689 Other abnormalities of gait and mobility: Secondary | ICD-10-CM | POA: Diagnosis not present

## 2019-11-05 DIAGNOSIS — Z4789 Encounter for other orthopedic aftercare: Secondary | ICD-10-CM | POA: Diagnosis not present

## 2019-11-05 DIAGNOSIS — N183 Chronic kidney disease, stage 3 unspecified: Secondary | ICD-10-CM | POA: Diagnosis not present

## 2019-11-05 DIAGNOSIS — E1122 Type 2 diabetes mellitus with diabetic chronic kidney disease: Secondary | ICD-10-CM | POA: Diagnosis not present

## 2019-11-05 DIAGNOSIS — I129 Hypertensive chronic kidney disease with stage 1 through stage 4 chronic kidney disease, or unspecified chronic kidney disease: Secondary | ICD-10-CM | POA: Diagnosis not present

## 2019-11-05 DIAGNOSIS — Z981 Arthrodesis status: Secondary | ICD-10-CM | POA: Diagnosis not present

## 2019-11-12 ENCOUNTER — Encounter (HOSPITAL_COMMUNITY): Payer: Self-pay | Admitting: *Deleted

## 2019-11-12 ENCOUNTER — Other Ambulatory Visit: Payer: Self-pay

## 2019-11-12 ENCOUNTER — Emergency Department (HOSPITAL_COMMUNITY)
Admission: EM | Admit: 2019-11-12 | Discharge: 2019-11-12 | Disposition: A | Discharge status: Home / Self Care | Payer: Medicare Other | Attending: Emergency Medicine | Admitting: Emergency Medicine

## 2019-11-12 ENCOUNTER — Emergency Department (HOSPITAL_COMMUNITY): Payer: Medicare Other

## 2019-11-12 DIAGNOSIS — Y9301 Activity, walking, marching and hiking: Secondary | ICD-10-CM | POA: Diagnosis not present

## 2019-11-12 DIAGNOSIS — S99921A Unspecified injury of right foot, initial encounter: Secondary | ICD-10-CM | POA: Diagnosis not present

## 2019-11-12 DIAGNOSIS — Z7982 Long term (current) use of aspirin: Secondary | ICD-10-CM | POA: Diagnosis not present

## 2019-11-12 DIAGNOSIS — W102XXA Fall (on)(from) incline, initial encounter: Secondary | ICD-10-CM | POA: Insufficient documentation

## 2019-11-12 DIAGNOSIS — Z794 Long term (current) use of insulin: Secondary | ICD-10-CM | POA: Diagnosis not present

## 2019-11-12 DIAGNOSIS — M7989 Other specified soft tissue disorders: Secondary | ICD-10-CM | POA: Diagnosis not present

## 2019-11-12 DIAGNOSIS — Y929 Unspecified place or not applicable: Secondary | ICD-10-CM | POA: Insufficient documentation

## 2019-11-12 DIAGNOSIS — S99912A Unspecified injury of left ankle, initial encounter: Secondary | ICD-10-CM | POA: Diagnosis not present

## 2019-11-12 DIAGNOSIS — Y999 Unspecified external cause status: Secondary | ICD-10-CM | POA: Diagnosis not present

## 2019-11-12 DIAGNOSIS — S6991XA Unspecified injury of right wrist, hand and finger(s), initial encounter: Secondary | ICD-10-CM | POA: Diagnosis not present

## 2019-11-12 DIAGNOSIS — S0093XA Contusion of unspecified part of head, initial encounter: Secondary | ICD-10-CM | POA: Diagnosis not present

## 2019-11-12 DIAGNOSIS — Z79899 Other long term (current) drug therapy: Secondary | ICD-10-CM | POA: Insufficient documentation

## 2019-11-12 DIAGNOSIS — N183 Chronic kidney disease, stage 3 unspecified: Secondary | ICD-10-CM | POA: Diagnosis not present

## 2019-11-12 DIAGNOSIS — S6992XA Unspecified injury of left wrist, hand and finger(s), initial encounter: Secondary | ICD-10-CM | POA: Diagnosis not present

## 2019-11-12 DIAGNOSIS — S9031XA Contusion of right foot, initial encounter: Secondary | ICD-10-CM | POA: Diagnosis not present

## 2019-11-12 DIAGNOSIS — Z96641 Presence of right artificial hip joint: Secondary | ICD-10-CM | POA: Diagnosis not present

## 2019-11-12 DIAGNOSIS — I129 Hypertensive chronic kidney disease with stage 1 through stage 4 chronic kidney disease, or unspecified chronic kidney disease: Secondary | ICD-10-CM | POA: Diagnosis not present

## 2019-11-12 DIAGNOSIS — S93401A Sprain of unspecified ligament of right ankle, initial encounter: Secondary | ICD-10-CM | POA: Insufficient documentation

## 2019-11-12 DIAGNOSIS — E1122 Type 2 diabetes mellitus with diabetic chronic kidney disease: Secondary | ICD-10-CM | POA: Insufficient documentation

## 2019-11-12 DIAGNOSIS — S99911A Unspecified injury of right ankle, initial encounter: Secondary | ICD-10-CM | POA: Diagnosis not present

## 2019-11-12 DIAGNOSIS — S0011XA Contusion of right eyelid and periocular area, initial encounter: Secondary | ICD-10-CM | POA: Diagnosis not present

## 2019-11-12 DIAGNOSIS — S99922A Unspecified injury of left foot, initial encounter: Secondary | ICD-10-CM | POA: Diagnosis not present

## 2019-11-12 DIAGNOSIS — T07XXXA Unspecified multiple injuries, initial encounter: Secondary | ICD-10-CM

## 2019-11-12 NOTE — ED Provider Notes (Signed)
[facility] Provider Note   CSN: [id number] Arrival date & time: 11/12/19  1657     History Chief Complaint  Patient presents with  . Fall    Heather [name] is a 69 y.o. female.  HPI   This is a 69 year old female, she has a history of recent back surgery.  She reports that she underwent rods in her back over a month ago and since that time has had some difficulty using her left leg however she is able to walk sometimes feeling like it gives out.  Yesterday when she was walking down a ramp she slipped on a wet leaf and fell to the ground, she did bump her right cheek lightly but did not pass out, denies headache or neck pain.  She reports that she injured her bilateral feet and ankles as well as her right middle finger and left small finger.  This occurred approximately 18 to 24 hours ago, the pain has been persistent, gradually worsening and associated with swelling of the right ankle, bruising of the left foot.  She is able to walk but states that she is limping on her feet because they hurt.  She does not take any anticoagulants, she used to take aspirin prior to her back surgery.  Past Medical History:  Diagnosis Date  . Acute renal failure superimposed on stage 3 chronic kidney disease ([place]) 01/21/2016  . Anemia   . Anxiety   . B12 deficiency   . Cellulitis and abscess    Abdomen and buttocks  . CKD (chronic kidney disease) stage 3, GFR 30-59 ml/min   . Closed fracture of distal end of femur, unspecified fracture morphology, initial encounter ([place])   . Depression   . Diabetic neuropathy (HCC)    feet  . DJD (degenerative joint disease)    Right forminal stenosis C4-5  . Essential hypertension   . Femur fracture, right ([place]) 03/30/2017  . Folliculitis   . GERD (gastroesophageal reflux disease)   . Gout   . Headache(784.0)   . Hiatal hernia   . History of blood transfusion   . History of bronchitis   . History of cardiac catheterization    No  significant CAD 2012  . History of kidney stones    surgery to remove  . Insomnia   . Irritable bowel syndrome   . Mixed hyperlipidemia   . Osteoarthritis   . Palpitations   . Reflux esophagitis   . Tinnitus    Right  . Type 2 diabetes mellitus [facility])     Patient Active Problem List   Diagnosis Date Noted  . Closed right hip fracture ([place]) 04/08/2018  . Salmonella   . Elevated lipase   . Gastroenteritis   . Protein-calorie malnutrition, severe 03/05/2018  . Neuropathy 03/02/2015  . Pain in the chest   . Chest pain 12/22/2014  . Essential hypertension 12/22/2014  . Diabetes mellitus with chronic kidney disease ([place]) 12/22/2014  . Hiatal hernia 12/22/2014  . GERD (gastroesophageal reflux disease) 12/22/2014  . Gout 12/22/2014  . Mixed hyperlipidemia 12/22/2014  . Depression 12/22/2014  . Chronic kidney disease, stage III (moderate) 08/25/2014  . Vitamin B12 deficiency 07/08/2011  . Personal history of failed moderate sedation 04/16/2011  . Chronic Nausea and Vomiting - ? gastroapresis 04/16/2011  . Early satiety 04/16/2011  . Irritable bowel syndrome 04/16/2011  . Benign paroxysmal positional vertigo 11/20/2010  . TRICUSPID REGURGITATION, MODERATE 11/20/2010  . PULMONARY HYPERTENSION, MILD 11/20/2010  . LEG EDEMA,  BILATERAL 11/20/2010  . DYSPNEA 11/20/2010  . NONSPECIFIC ABNORMAL ELECTROCARDIOGRAM 11/20/2010  . CHEST WALL PAIN, HX OF 11/20/2010    Past Surgical History:  Procedure Laterality Date  . BACK SURGERY    . c-spine surgery     03/2007  . CARDIAC CATHETERIZATION    . CHOLECYSTECTOMY    . COLONOSCOPY  04/21/2011; 05/29/11   6/12: [place] - ?AVM at IC valve, inflammatory changes at [facility] valve [facility]); 7/12 - [facility]; IC valve erosions, look chronic and probably Crohn's per path  . colonoscopy  2017   [place]: random biopsies normal. TI normal  . ESOPHAGOGASTRODUODENOSCOPY  05/29/11   normal  . ESOPHAGOGASTRODUODENOSCOPY N/A 01/21/2016   Dr.  Michail Sermon: normal  . EYE SURGERY Bilateral    cataracts removed  . FEMUR IM NAIL Right 04/10/2018   Procedure: INTRAMEDULLARY (IM) NAIL FEMORAL;  Surgeon: Paralee Cancel, MD;  Location: [facility];  Service: Orthopedics;  Laterality: Right;  . HARDWARE REMOVAL Right 04/10/2018   Procedure: HARDWARE REMOVAL RIGHT DISTAL FEMUR;  Surgeon: Paralee Cancel, MD;  Location: [facility];  Service: Orthopedics;  Laterality: Right;  . IR FLUORO GUIDE CV LINE RIGHT  04/01/2017  . IR US GUIDE VASC ACCESS RIGHT  04/01/2017  . JOINT REPLACEMENT Right    hip  . OPEN REDUCTION INTERNAL FIXATION (ORIF) DISTAL RADIAL FRACTURE Right 05/28/2019   Procedure: OPEN REDUCTION INTERNAL FIXATION (ORIF) DISTAL RADIAL FRACTURE;  Surgeon: Verner Mould, MD;  Location: [facility];  Service: Orthopedics;  Laterality: Right;  with block 90 minutes  . ORIF FEMUR FRACTURE Right 03/31/2017   Procedure: OPEN REDUCTION INTERNAL FIXATION (ORIF) DISTAL FEMUR FRACTURE;  Surgeon: Paralee Cancel, MD;  Location: [facility];  Service: Orthopedics;  Laterality: Right;  . removal of kidney stone    . Right knee arthroscopic surgery    . TONSILLECTOMY    . TOTAL ABDOMINAL HYSTERECTOMY       OB History   No obstetric history on file.     Family History  Problem Relation Age of Onset  . Diabetes Mother   . Colon cancer Brother        POSSIBLY. Patient states diagnosed at age 85, then later states age 41. unclear and limited historian    Social History   Tobacco Use  . Smoking status: Never Smoker  . Smokeless tobacco: Never Used  Substance Use Topics  . Alcohol use: No  . Drug use: No    Home Medications Prior to Admission medications   Medication Sig Start Date End Date Taking? Authorizing Provider  allopurinol (ZYLOPRIM) 100 MG tablet Take 100 mg by mouth daily.    [provider]  Ascorbic Acid (VITAMIN C WITH ROSE HIPS) 500 MG tablet Take 500 mg by mouth every morning.    [provider]  aspirin EC 81 MG tablet Take  81 mg by mouth every morning.     [provider]  atorvastatin (LIPITOR) 80 MG tablet Take 80 mg by mouth every morning.    [provider]  Cholecalciferol (VITAMIN D) 2000 units CAPS Take 2,000 Units by mouth every morning.     [provider]  citalopram (CELEXA) 40 MG tablet Take 40 mg by mouth daily.    [provider]  cyanocobalamin (,VITAMIN B-12,) 1000 MCG/ML injection Inject 1,000 mcg into the muscle every 30 (thirty) days.      [provider]  diazepam (VALIUM) 5 MG tablet Take 1 tablet (5 mg total) by mouth 2 (  two) times daily as needed for anxiety. 04/14/18   Mariel Aloe, MD  esomeprazole (NEXIUM) 40 MG capsule Take 40 mg by mouth daily before breakfast.      [provider]  furosemide (LASIX) 40 MG tablet Take 1 tablet (40 mg total) by mouth daily. Patient taking differently: Take 40 mg by mouth every morning.  01/26/16   Reyne Dumas, MD  HUMALOG KWIKPEN 100 UNIT/ML KiwkPen as directed. 07/04/18   [provider]  indomethacin (INDOCIN) 50 MG capsule Take 50 mg by mouth 3 (three) times daily as needed.    [provider]  Insulin Detemir (LEVEMIR FLEXTOUCH) 100 UNIT/ML Pen Inject 80 Units into the skin at bedtime. Patient taking differently: Inject 20 Units into the skin at bedtime. Patient adjust for her sugars 04/03/17   Rosita Fire, MD  Insulin Lispro (HUMALOG KWIKPEN [place]) Inject 1-21 Units into the skin 3 (three) times daily with meals. Per home sliding scale    [provider]  Melatonin 10 MG TABS Take 10 mg by mouth at bedtime as needed.     [provider]  metoprolol (LOPRESSOR) 100 MG tablet Take 100 mg by mouth 2 (two) times daily.     [provider]  ondansetron (ZOFRAN) 4 MG tablet Take 1 tablet (4 mg total) by mouth every 8 (eight) hours as needed for nausea or vomiting. 06/10/18   Gatha Mayer, MD  oxyCODONE (ROXICODONE) 5 MG immediate release tablet Take 1  tablet (5 mg total) by mouth every 6 (six) hours as needed for severe pain. 10/27/19   Henderly, Britni A, PA-C  OZEMPIC, 0.25 OR 0.5 MG/DOSE, 2 MG/1.5ML SOPN Inject 0.5 Units into the skin once a week. On Monday 07/30/18   [provider]  potassium chloride SA (K-DUR,KLOR-CON) 20 MEQ tablet Take 20 mEq by mouth every morning.     [provider]  tiZANidine (ZANAFLEX) 4 MG tablet Take 8 mg by mouth at bedtime.    [provider]    Allergies    Duloxetine hcl, Fluocinolone, Acetaminophen, Amlodipine besylate, Ciprofloxacin, Hydrocodone-acetaminophen, Metformin, Nsaids, Quinolones, Sulfamethoxazole, Sulfonamide derivatives, Valsartan, Cozaar [losartan], Lisinopril, and Tape  Review of Systems   Review of Systems  Gastrointestinal: Negative for nausea and vomiting.  Musculoskeletal: Positive for gait problem and joint swelling. Negative for myalgias and neck pain.  Neurological: Negative for light-headedness and headaches.  Hematological: Does not bruise/bleed easily.    Physical Exam Updated Vital Signs BP (!) 112/58 (BP Location: Right Arm)   Pulse 92   Temp 98.9 F (37.2 C) (Oral)   Resp 16   Ht 1.6 m (5\' 3" )   Wt 57.1 kg   LMP  (LMP Unknown)   SpO2 100%   BMI 22.30 kg/m   Physical Exam Vitals and nursing note reviewed.  Constitutional:      General: She is not in acute distress.    Appearance: She is well-developed.  HENT:     Head: Normocephalic.     Comments: Minimal small amount of bruising to the right cheek, no malocclusion, able to open and close the mouth without difficulty, no tenderness over the jaw, no malocclusion, no hemotympanum, no raccoon eyes, no battle sign    Mouth/Throat:     Mouth: Mucous membranes are moist.     Pharynx: No oropharyngeal exudate or posterior oropharyngeal erythema.  Eyes:     General: No scleral icterus.       Right eye: No discharge.  Left eye: No discharge.     Conjunctiva/sclera: Conjunctivae  normal.     Pupils: Pupils are equal, round, and reactive to light.  Neck:     Thyroid: No thyromegaly.     Vascular: No JVD.  Cardiovascular:     Rate and Rhythm: Normal rate and regular rhythm.     Heart sounds: Normal heart sounds. No murmur. No friction rub. No gallop.   Pulmonary:     Effort: Pulmonary effort is normal. No respiratory distress.     Breath sounds: Normal breath sounds. No wheezing or rales.  Abdominal:     General: Bowel sounds are normal. There is no distension.     Palpations: Abdomen is soft. There is no mass.     Tenderness: There is no abdominal tenderness.  Musculoskeletal:        General: Swelling, tenderness and deformity present. Normal range of motion.     Cervical back: Normal range of motion and neck supple.     Right lower leg: No edema.     Left lower leg: No edema.     Comments: Swelling of the right ankle with tenderness both medial and lateral, tenderness over the fifth metatarsal, left foot with bruising and tenderness over the fourth and fifth toes, no tenderness over the base of the fifth metatarsal, normal range of motion of the ankle.  Right and left hands with tenderness the right hand over the middle finger and the left hand over the fifth finger with decreased range of motion, no obvious significant swelling.  Upper extremities otherwise normal, lower extremities otherwise normal.  Lymphadenopathy:     Cervical: No cervical adenopathy.  Skin:    General: Skin is warm and dry.     Findings: Bruising present. No erythema or rash.  Neurological:     Mental Status: She is alert.     Coordination: Coordination normal.     Comments: Normal strength and sensation diffusely, range of motion restricted only secondary to pain from injuries  Psychiatric:        Behavior: Behavior normal.     ED Results / Procedures / Treatments   Labs (all labs ordered are listed, but only abnormal results are displayed) Labs Reviewed - No data to  display  EKG None  Radiology DG Ankle Complete Left  Result Date: 11/12/2019 CLINICAL DATA:  Initial evaluation for acute trauma, fall. EXAM: LEFT ANKLE COMPLETE - 3+ VIEW COMPARISON:  None. FINDINGS: No acute fracture dislocation. Ankle mortise approximated. Talar dome intact. Prominent posterior and plantar calcaneal enthesophytes noted. No visible soft tissue injury. Scattered vascular calcifications noted within the lower leg. IMPRESSION: No acute osseous abnormality about the left ankle. Electronically Signed   By: Jeannine Boga M.D.   On: 11/12/2019 21:14   DG Ankle Complete Right  Result Date: 11/12/2019 CLINICAL DATA:  Initial evaluation for acute trauma, fall. EXAM: RIGHT ANKLE - COMPLETE 3+ VIEW COMPARISON:  None. FINDINGS: Bones are diffusely osteopenic, somewhat limiting evaluation for possible subtle nondisplaced fractures. Prominent soft tissue swelling overlies the lateral malleolus. There is question of a subtle linear transverse lucency extending through the distal aspect of the lateral malleolus, which could reflect a subtle acute nondisplaced fracture, difficult to be certain. Ankle mortise remains approximated. Talar dome intact. Distal tibia intact. Moderate osteoarthritic changes noted about the ankle. Posterior calcaneal enthesophyte noted. Scattered vascular calcifications noted within the visualized soft tissues. IMPRESSION: 1. Prominent soft tissue swelling overlying the lateral malleolus. 2. Question subtle linear transverse  lucency extending through the distal aspect of the lateral malleolus, which could reflect a subtle acute nondisplaced fracture, difficult to be certain given patient's underlying osteopenia. Follow-up examination with dedicated CT could be performed for further evaluation as clinically warranted. 3. Moderate degenerative osteoarthrosis about the ankle. Electronically Signed   By: Jeannine Boga M.D.   On: 11/12/2019 21:12   DG Hand Complete  Left  Result Date: 11/12/2019 CLINICAL DATA:  Initial evaluation for acute trauma, fall. EXAM: LEFT HAND - COMPLETE 3+ VIEW COMPARISON:  None. FINDINGS: No acute fracture or dislocation. Mild scattered osteoarthritic changes about the hand, most notable at the first [facility] articulation. Underlying osteopenia. No visible soft tissue injury. IMPRESSION: No acute osseous abnormality about the left hand. Electronically Signed   By: Jeannine Boga M.D.   On: 11/12/2019 21:22   DG Hand Complete Right  Result Date: 11/12/2019 CLINICAL DATA:  Initial evaluation for acute trauma, fall. EXAM: RIGHT HAND - COMPLETE 3+ VIEW COMPARISON:  None. FINDINGS: No acute fracture dislocation. Joint spaces maintained without evidence for significant degenerative or erosive arthropathy. Sequelae of prior ORIF present at the distal right radius. No visible hardware complication. Underlying osteopenia. No acute soft tissue abnormality. Subcentimeter radiopaque density within the soft tissues adjacent to the right second metacarpal, of doubtful significance. IMPRESSION: 1. No acute osseous abnormality about the right hand. 2. Sequelae of prior ORIF at the distal right radius. No visible hardware complication. Electronically Signed   By: Jeannine Boga M.D.   On: 11/12/2019 21:20   DG Foot Complete Left  Result Date: 11/12/2019 CLINICAL DATA:  Initial evaluation for acute trauma, fall. EXAM: LEFT FOOT - COMPLETE 3+ VIEW COMPARISON:  None. FINDINGS: No acute fracture or dislocation. Mild osteoarthritic changes noted about the first MTP articulation. Posterior plantar calcaneal enthesophytes noted. Underlying osteopenia. No acute soft tissue abnormality. Scattered vascular calcifications noted within the visualized soft tissues. IMPRESSION: No acute osseous abnormality about the left foot. Electronically Signed   By: Jeannine Boga M.D.   On: 11/12/2019 21:17   DG Foot Complete Right  Result Date: 11/12/2019 CLINICAL  DATA:  Initial evaluation for acute trauma, fall. EXAM: RIGHT FOOT COMPLETE - 3+ VIEW COMPARISON:  None. FINDINGS: No acute fracture or dislocation. Underlying osteopenia noted. Scattered osteoarthritic changes noted about the foot, most notable at the first MTP joint. Prominent degenerative change noted about the ankle as well. No visible soft tissue injury. Few scattered vascular calcifications noted. IMPRESSION: 1. No acute fracture or dislocation. 2. Moderate osteoarthritic changes about the foot and ankle. 3. Underlying osteopenia. Electronically Signed   By: Jeannine Boga M.D.   On: 11/12/2019 21:05    Procedures Procedures (including critical care time)  Medications Ordered in ED Medications - No data to display  ED Course  I have reviewed the triage vital signs and the nursing notes.  Pertinent labs & imaging results that were available during my care of the patient were reviewed by me and considered in my medical decision making (see chart for details).  Clinical Course as of Nov 11 2133  Thu Nov 12, 2019  2133 Given the patient's underlying osteopenia it is difficult to evaluate the x-rays for fracture however the right ankle x-ray suggest a possible linear abnormality that could be consistent with a distal malleolar fracture thus the patient will be placed in a ankle splint and given crutches.  No other acute findings on x-rays to suggest need for further stabilizing care   [BM]  Clinical Course User Index [BM] Noemi Chapel, MD   MDM Rules/Calculators/A&P                      Rule out fractures of involved structures, otherwise well-appearing with minimal minor injury to the head that will not require CT scan.  She is awake alert and following commands and in no significant distress  Final Clinical Impression(s) / ED Diagnoses Final diagnoses:  Contusion of multiple sites  Sprain of right ankle, unspecified ligament, initial encounter    Rx / DC Orders ED  Discharge Orders    None       Noemi Chapel, MD 11/12/19 2135

## 2019-11-12 NOTE — Discharge Instructions (Signed)
Please use the crutches and the ankle splint to help with walking.  You do not have any obvious broken bones however there is a small part of your ankle that looks like it may have a chip.  Please follow-up with Dr. [name] or the orthopedist of your choice within 1 week for a recheck, emergency department for severe or worsening symptoms

## 2019-11-12 NOTE — ED Triage Notes (Signed)
Pt fell and hit her face on cement porch last night, denies LOC.  Pt with swelling noted to right ankle, bruising noted to right side of her face.

## 2019-11-12 NOTE — ED Notes (Signed)
Pt states her sheet has bugs on it and she can see them crawling. Pt pointed to a piece of straw and lint. Told pt that the objects were dirt and not bugs. Told pt her room was cleaned and the sheets changed prior to her arrival in room. Told pt the dirt was from her clothes/belongings. Pt refused to get back in the bed. Told pt she could sit in the chair. Pt angry when I tried to close the door. Explained to pt that the doors must be shut due to [place]. PT states "You are not closing this door!". Left door cracked due to pt complaint.

## 2019-11-18 DIAGNOSIS — E119 Type 2 diabetes mellitus without complications: Secondary | ICD-10-CM | POA: Diagnosis not present

## 2019-11-18 DIAGNOSIS — I10 Essential (primary) hypertension: Secondary | ICD-10-CM | POA: Diagnosis not present

## 2019-11-19 ENCOUNTER — Ambulatory Visit (INDEPENDENT_AMBULATORY_CARE_PROVIDER_SITE_OTHER): Payer: Medicare Other | Admitting: Orthopaedic Surgery

## 2019-11-19 ENCOUNTER — Ambulatory Visit: Payer: Medicare Other

## 2019-11-19 ENCOUNTER — Other Ambulatory Visit: Payer: Self-pay

## 2019-11-19 ENCOUNTER — Encounter: Payer: Self-pay | Admitting: Orthopaedic Surgery

## 2019-11-19 VITALS — BP 140/80 | HR 88 | Ht 63.0 in | Wt 125.0 lb

## 2019-11-19 DIAGNOSIS — M25571 Pain in right ankle and joints of right foot: Secondary | ICD-10-CM

## 2019-11-19 DIAGNOSIS — M545 Low back pain: Secondary | ICD-10-CM | POA: Diagnosis not present

## 2019-11-19 DIAGNOSIS — W102XXA Fall (on)(from) incline, initial encounter: Secondary | ICD-10-CM

## 2019-11-19 DIAGNOSIS — Z4789 Encounter for other orthopedic aftercare: Secondary | ICD-10-CM | POA: Diagnosis not present

## 2019-11-19 DIAGNOSIS — S8264XA Nondisplaced fracture of lateral malleolus of right fibula, initial encounter for closed fracture: Secondary | ICD-10-CM

## 2019-11-19 DIAGNOSIS — Z981 Arthrodesis status: Secondary | ICD-10-CM | POA: Diagnosis not present

## 2019-11-19 DIAGNOSIS — M4807 Spinal stenosis, lumbosacral region: Secondary | ICD-10-CM | POA: Diagnosis not present

## 2019-11-19 DIAGNOSIS — R2689 Other abnormalities of gait and mobility: Secondary | ICD-10-CM | POA: Diagnosis not present

## 2019-11-19 NOTE — Progress Notes (Signed)
Subjective:    Patient ID: Heather [name], female    DOB: 03-21-1951, 69 y.o.   MRN: [id number]  HPI She fell on wet leaves while going down a ramp on 11-12-2019. She had pain in multiple areas and was seen in the ER and had multiple x-rays.  She complains of persistent pain in the right ankle with swelling and decreased motion of the ankle.  She has an Armed forces training and education officer.  It still hurts with weight bearing.  I have reviewed the ER record, the x-rays and x-ray reports.  I have independently reviewed and interpreted x-rays of this patient done at another site by another physician or qualified health professional.  I am concerned about a fracture of the lateral malleolus.  New X-rays to be done here today.   Review of Systems  Constitutional: Positive for activity change.  Musculoskeletal: Positive for arthralgias, gait problem and joint swelling.  Neurological: Positive for headaches.  Psychiatric/Behavioral: The patient is nervous/anxious.   All other systems reviewed and are negative.  For Review of Systems, all other systems reviewed and are negative.  The following is a summary of the past history medically, past history surgically, known current medicines, social history and family history.  This information is gathered electronically by the computer from prior information and documentation.  I review this each visit and have found including this information at this point in the chart is beneficial and informative.   Past Medical History:  Diagnosis Date  . Acute renal failure superimposed on stage 3 chronic kidney disease ([place]) 01/21/2016  . Anemia   . Anxiety   . B12 deficiency   . Cellulitis and abscess    Abdomen and buttocks  . CKD (chronic kidney disease) stage 3, GFR 30-59 ml/min   . Closed fracture of distal end of femur, unspecified fracture morphology, initial encounter ([place])   . Depression   . Diabetic neuropathy (HCC)    feet  . DJD (degenerative joint disease)    Right forminal stenosis C4-5  . Essential hypertension   . Femur fracture, right ([place]) 03/30/2017  . Folliculitis   . GERD (gastroesophageal reflux disease)   . Gout   . Headache(784.0)   . Hiatal hernia   . History of blood transfusion   . History of bronchitis   . History of cardiac catheterization    No significant CAD 2012  . History of kidney stones    surgery to remove  . Insomnia   . Irritable bowel syndrome   . Mixed hyperlipidemia   . Osteoarthritis   . Palpitations   . Reflux esophagitis   . Tinnitus    Right  . Type 2 diabetes mellitus ([place])     Past Surgical History:  Procedure Laterality Date  . BACK SURGERY    . c-spine surgery     03/2007  . CARDIAC CATHETERIZATION    . CHOLECYSTECTOMY    . COLONOSCOPY  04/21/2011; 05/29/11   6/12: [place] - ?AVM at IC valve, inflammatory changes at [facility] valve [facility]); 7/12 - [facility]; IC valve erosions, look chronic and probably Crohn's per path  . colonoscopy  2017   [place]: random biopsies normal. TI normal  . ESOPHAGOGASTRODUODENOSCOPY  05/29/11   normal  . ESOPHAGOGASTRODUODENOSCOPY N/A 01/21/2016   Dr. Michail Sermon: normal  . EYE SURGERY Bilateral    cataracts removed  . FEMUR IM NAIL Right 04/10/2018   Procedure: INTRAMEDULLARY (IM) NAIL FEMORAL;  Surgeon: Paralee Cancel, MD;  Location: [facility];  Service: Orthopedics;  Laterality: Right;  . HARDWARE REMOVAL Right 04/10/2018   Procedure: HARDWARE REMOVAL RIGHT DISTAL FEMUR;  Surgeon: Paralee Cancel, MD;  Location: [facility];  Service: Orthopedics;  Laterality: Right;  . IR FLUORO GUIDE CV LINE RIGHT  04/01/2017  . IR US GUIDE VASC ACCESS RIGHT  04/01/2017  . JOINT REPLACEMENT Right    hip  . OPEN REDUCTION INTERNAL FIXATION (ORIF) DISTAL RADIAL FRACTURE Right 05/28/2019   Procedure: OPEN REDUCTION INTERNAL FIXATION (ORIF) DISTAL RADIAL FRACTURE;  Surgeon: Verner Mould, MD;  Location: [place];  Service: Orthopedics;  Laterality: Right;  with block 90 minutes  .  ORIF FEMUR FRACTURE Right 03/31/2017   Procedure: OPEN REDUCTION INTERNAL FIXATION (ORIF) DISTAL FEMUR FRACTURE;  Surgeon: Paralee Cancel, MD;  Location: [place];  Service: Orthopedics;  Laterality: Right;  . removal of kidney stone    . Right knee arthroscopic surgery    . TONSILLECTOMY    . TOTAL ABDOMINAL HYSTERECTOMY      Current Outpatient Medications on File Prior to Visit  Medication Sig Dispense Refill  . allopurinol (ZYLOPRIM) 100 MG tablet Take 100 mg by mouth daily.    . Ascorbic Acid (VITAMIN C WITH ROSE HIPS) 500 MG tablet Take 500 mg by mouth every morning.    Marland Kitchen aspirin EC 81 MG tablet Take 81 mg by mouth every morning.     Marland Kitchen atorvastatin (LIPITOR) 80 MG tablet Take 80 mg by mouth every morning.    . Cholecalciferol (VITAMIN D) 2000 units CAPS Take 2,000 Units by mouth every morning.     . citalopram (CELEXA) 40 MG tablet Take 40 mg by mouth daily.    . cyanocobalamin (,VITAMIN B-12,) 1000 MCG/ML injection Inject 1,000 mcg into the muscle every 30 (thirty) days.      . diazepam (VALIUM) 5 MG tablet Take 1 tablet (5 mg total) by mouth 2 (two) times daily as needed for anxiety. 6 tablet 0  . esomeprazole (NEXIUM) 40 MG capsule Take 40 mg by mouth daily before breakfast.      . furosemide (LASIX) 40 MG tablet Take 1 tablet (40 mg total) by mouth daily. (Patient taking differently: Take 40 mg by mouth every morning. ) 30 tablet 0  . HUMALOG KWIKPEN 100 UNIT/ML KiwkPen as directed.  3  . indomethacin (INDOCIN) 50 MG capsule Take 50 mg by mouth 3 (three) times daily as needed.    . Insulin Detemir (LEVEMIR FLEXTOUCH) 100 UNIT/ML Pen Inject 80 Units into the skin at bedtime. (Patient taking differently: Inject 20 Units into the skin at bedtime. Patient adjust for her sugars)    . Insulin Lispro (HUMALOG KWIKPEN [place]) Inject 1-21 Units into the skin 3 (three) times daily with meals. Per home sliding scale    . Melatonin 10 MG TABS Take 10 mg by mouth at bedtime as needed.     . metoprolol  (LOPRESSOR) 100 MG tablet Take 100 mg by mouth 2 (two) times daily.     . ondansetron (ZOFRAN) 4 MG tablet Take 1 tablet (4 mg total) by mouth every 8 (eight) hours as needed for nausea or vomiting. 30 tablet 1  . oxyCODONE (ROXICODONE) 5 MG immediate release tablet Take 1 tablet (5 mg total) by mouth every 6 (six) hours as needed for severe pain. 8 tablet 0  . OZEMPIC, 0.25 OR 0.5 MG/DOSE, 2 MG/1.5ML SOPN Inject 0.5 Units into the skin once a week. On Monday    . potassium chloride SA (K-DUR,KLOR-CON)  20 MEQ tablet Take 20 mEq by mouth every morning.     Marland Kitchen tiZANidine (ZANAFLEX) 4 MG tablet Take 8 mg by mouth at bedtime.     No current facility-administered medications on file prior to visit.    Social History   Socioeconomic History  . Marital status: Married    Spouse name: Not on file  . Number of children: 2  . Years of education: Not on file  . Highest education level: Not on file  Occupational History  . Occupation: Disabled  Tobacco Use  . Smoking status: Never Smoker  . Smokeless tobacco: Never Used  Substance and Sexual Activity  . Alcohol use: No  . Drug use: No  . Sexual activity: Not on file    Comment: Hysterectomy  Other Topics Concern  . 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 Difficulty of Paying Living Expenses: Not on file  Food Insecurity:   . Worried About Charity fundraiser in the Last Year: Not on file  . Ran Out of Food in the Last Year: Not on file  Transportation Needs:   . Lack of Transportation (Medical): Not on file  . Lack of Transportation (Non-Medical): Not on file  Physical Activity:   . Days of Exercise per Week: Not on file  . Minutes of Exercise per Session: Not on  file  Stress:   . Feeling of Stress : Not on file  Social Connections:   . Frequency of Communication with Friends and Family: Not on file  . Frequency of Social Gatherings with Friends and Family: Not on file  . Attends Religious Services: Not on file  . Active Member of Clubs or Organizations: Not on file  . Attends Archivist Meetings: Not on file  . Marital Status: Not on file  Intimate Partner Violence:   . Fear of Current or Ex-Partner: Not on file  . Emotionally Abused: Not on file  . Physically Abused: Not on file  . Sexually Abused: Not on file    Family History  Problem Relation Age of Onset  . Diabetes Mother   . Colon cancer Brother        POSSIBLY. Patient states diagnosed at age 55, then later states age 74. unclear and limited historian    BP 140/80   Pulse 88   Ht 5\' 3"  (1.6 m)   Wt 125 lb (56.7 kg)   LMP  (LMP Unknown)   BMI 22.14 kg/m   Body mass index is 22.14 kg/m.     Objective:   Physical Exam Vitals and nursing note reviewed.  Constitutional:      Appearance: She is well-developed.  HENT:     Head: Normocephalic and atraumatic.  Eyes:     Conjunctiva/sclera: Conjunctivae normal.     Pupils: Pupils are equal, round, and reactive to light.  Cardiovascular:     Rate and Rhythm: Normal rate and regular rhythm.  Pulmonary:     Effort: Pulmonary effort is normal.  Abdominal:     Palpations: Abdomen is soft.  Musculoskeletal:     Cervical back: Normal range of motion and neck supple.  Legs:  Skin:    General: Skin is warm and dry.  Neurological:     Mental Status: She is alert and oriented to person, place, and time.     Cranial Nerves: No cranial nerve deficit.     Motor: No abnormal muscle tone.     Coordination: Coordination normal.     Deep Tendon Reflexes: Reflexes are normal and symmetric. Reflexes normal.  Psychiatric:        Behavior: Behavior normal.        Thought Content: Thought content normal.         Judgment: Judgment normal.   x-rays were done of the right ankle, reported separately.        Assessment & Plan:   Encounter Diagnoses  Name Primary?  . Acute right ankle pain Yes  . Closed traumatic nondisplaced fracture of lateral malleolus of right fibula, initial encounter    She has a fracture of the lateral malleolus.  She was given CAM walker and instructions.  Return in three weeks.  X-rays on return.  Call if any problem.  Precautions discussed.   Electronically Signed Sanjuana Kava, MD 1/21/202110:31 AM

## 2019-12-02 DIAGNOSIS — N189 Chronic kidney disease, unspecified: Secondary | ICD-10-CM | POA: Diagnosis not present

## 2019-12-02 DIAGNOSIS — I5032 Chronic diastolic (congestive) heart failure: Secondary | ICD-10-CM | POA: Diagnosis not present

## 2019-12-02 DIAGNOSIS — E211 Secondary hyperparathyroidism, not elsewhere classified: Secondary | ICD-10-CM | POA: Diagnosis not present

## 2019-12-02 DIAGNOSIS — I129 Hypertensive chronic kidney disease with stage 1 through stage 4 chronic kidney disease, or unspecified chronic kidney disease: Secondary | ICD-10-CM | POA: Diagnosis not present

## 2019-12-02 DIAGNOSIS — R809 Proteinuria, unspecified: Secondary | ICD-10-CM | POA: Diagnosis not present

## 2019-12-10 ENCOUNTER — Ambulatory Visit: Payer: Medicare Other

## 2019-12-10 ENCOUNTER — Other Ambulatory Visit: Payer: Self-pay

## 2019-12-10 ENCOUNTER — Encounter: Payer: Self-pay | Admitting: Orthopaedic Surgery

## 2019-12-10 ENCOUNTER — Ambulatory Visit (INDEPENDENT_AMBULATORY_CARE_PROVIDER_SITE_OTHER): Payer: Medicare Other | Admitting: Orthopaedic Surgery

## 2019-12-10 DIAGNOSIS — S8264XD Nondisplaced fracture of lateral malleolus of right fibula, subsequent encounter for closed fracture with routine healing: Secondary | ICD-10-CM | POA: Diagnosis not present

## 2019-12-10 NOTE — Progress Notes (Signed)
My ankle is better  She has been using the CAM walker on the right.  She has no pain.  NV intact.  X-rays were done of the right ankle, three views.  Encounter Diagnosis  Name Primary?  . Closed traumatic nondisplaced fracture of lateral malleolus of right fibula with routine healing, subsequent encounter Yes   She can come out of CAM walker around the house, wear it when out doors.  Return in two weeks.  X-rays right ankle on return.  Call if any problem.  Precautions discussed.   Electronically Signed Sanjuana Kava, MD 2/11/20219:07 AM

## 2019-12-16 DIAGNOSIS — E119 Type 2 diabetes mellitus without complications: Secondary | ICD-10-CM | POA: Diagnosis not present

## 2019-12-16 DIAGNOSIS — I10 Essential (primary) hypertension: Secondary | ICD-10-CM | POA: Diagnosis not present

## 2019-12-24 ENCOUNTER — Ambulatory Visit: Payer: Medicare Other

## 2019-12-24 ENCOUNTER — Encounter: Payer: Self-pay | Admitting: Orthopaedic Surgery

## 2019-12-24 ENCOUNTER — Other Ambulatory Visit: Payer: Self-pay

## 2019-12-24 ENCOUNTER — Ambulatory Visit (INDEPENDENT_AMBULATORY_CARE_PROVIDER_SITE_OTHER): Payer: Medicare Other | Admitting: Orthopaedic Surgery

## 2019-12-24 VITALS — BP 165/94 | HR 92 | Temp 98.2°F | Ht 63.0 in | Wt 125.6 lb

## 2019-12-24 DIAGNOSIS — S8264XD Nondisplaced fracture of lateral malleolus of right fibula, subsequent encounter for closed fracture with routine healing: Secondary | ICD-10-CM

## 2019-12-24 NOTE — Progress Notes (Signed)
I feel good  She has been using the CAM walker on the right.  NV intact. ROM good.  X-rays were done of the right ankle, reported separately.  Encounter Diagnosis  Name Primary?  . Closed traumatic nondisplaced fracture of lateral malleolus of right fibula with routine healing, subsequent encounter Yes   Come out of the CAM walker.  Return in two weeks.  X-rays ankle right then.  Call if any problem.  Precautions discussed.   Electronically Signed Sanjuana Kava, MD 2/25/202110:23 AM

## 2019-12-25 DIAGNOSIS — J449 Chronic obstructive pulmonary disease, unspecified: Secondary | ICD-10-CM | POA: Diagnosis not present

## 2019-12-25 DIAGNOSIS — I10 Essential (primary) hypertension: Secondary | ICD-10-CM | POA: Diagnosis not present

## 2019-12-25 DIAGNOSIS — E1165 Type 2 diabetes mellitus with hyperglycemia: Secondary | ICD-10-CM | POA: Diagnosis not present

## 2019-12-25 DIAGNOSIS — J019 Acute sinusitis, unspecified: Secondary | ICD-10-CM | POA: Diagnosis not present

## 2019-12-25 DIAGNOSIS — Z6823 Body mass index (BMI) 23.0-23.9, adult: Secondary | ICD-10-CM | POA: Diagnosis not present

## 2019-12-25 DIAGNOSIS — Z299 Encounter for prophylactic measures, unspecified: Secondary | ICD-10-CM | POA: Diagnosis not present

## 2019-12-25 DIAGNOSIS — Z789 Other specified health status: Secondary | ICD-10-CM | POA: Diagnosis not present

## 2019-12-30 DIAGNOSIS — Z6823 Body mass index (BMI) 23.0-23.9, adult: Secondary | ICD-10-CM | POA: Diagnosis not present

## 2019-12-30 DIAGNOSIS — M810 Age-related osteoporosis without current pathological fracture: Secondary | ICD-10-CM | POA: Diagnosis not present

## 2019-12-30 DIAGNOSIS — N184 Chronic kidney disease, stage 4 (severe): Secondary | ICD-10-CM | POA: Diagnosis not present

## 2019-12-30 DIAGNOSIS — I10 Essential (primary) hypertension: Secondary | ICD-10-CM | POA: Diagnosis not present

## 2019-12-30 DIAGNOSIS — Z299 Encounter for prophylactic measures, unspecified: Secondary | ICD-10-CM | POA: Diagnosis not present

## 2019-12-30 DIAGNOSIS — E1165 Type 2 diabetes mellitus with hyperglycemia: Secondary | ICD-10-CM | POA: Diagnosis not present

## 2019-12-30 DIAGNOSIS — E1129 Type 2 diabetes mellitus with other diabetic kidney complication: Secondary | ICD-10-CM | POA: Diagnosis not present

## 2020-01-12 ENCOUNTER — Other Ambulatory Visit: Payer: Self-pay

## 2020-01-12 ENCOUNTER — Ambulatory Visit (INDEPENDENT_AMBULATORY_CARE_PROVIDER_SITE_OTHER): Payer: Medicare Other | Admitting: Orthopaedic Surgery

## 2020-01-12 ENCOUNTER — Ambulatory Visit: Payer: Medicare Other

## 2020-01-12 ENCOUNTER — Encounter: Payer: Self-pay | Admitting: Orthopaedic Surgery

## 2020-01-12 VITALS — Ht 63.0 in | Wt 127.0 lb

## 2020-01-12 DIAGNOSIS — S8264XD Nondisplaced fracture of lateral malleolus of right fibula, subsequent encounter for closed fracture with routine healing: Secondary | ICD-10-CM

## 2020-01-12 NOTE — Progress Notes (Signed)
I am OK  She has no pain to the right ankle.  She is walking well.  NV intact.  X-rays were done of the right ankle, reported separately.  Encounter Diagnosis  Name Primary?  . Closed traumatic nondisplaced fracture of lateral malleolus of right fibula with routine healing, subsequent encounter Yes   Discharge.  Call if any problem.  Precautions discussed.   Electronically Signed Sanjuana Kava, MD 3/16/20213:10 PM

## 2020-01-13 DIAGNOSIS — I2510 Atherosclerotic heart disease of native coronary artery without angina pectoris: Secondary | ICD-10-CM | POA: Diagnosis not present

## 2020-01-13 DIAGNOSIS — Z9049 Acquired absence of other specified parts of digestive tract: Secondary | ICD-10-CM | POA: Diagnosis not present

## 2020-01-13 DIAGNOSIS — Z96651 Presence of right artificial knee joint: Secondary | ICD-10-CM | POA: Diagnosis not present

## 2020-01-13 DIAGNOSIS — K589 Irritable bowel syndrome without diarrhea: Secondary | ICD-10-CM | POA: Diagnosis not present

## 2020-01-13 DIAGNOSIS — M1990 Unspecified osteoarthritis, unspecified site: Secondary | ICD-10-CM | POA: Diagnosis not present

## 2020-01-13 DIAGNOSIS — Z881 Allergy status to other antibiotic agents status: Secondary | ICD-10-CM | POA: Diagnosis not present

## 2020-01-13 DIAGNOSIS — Z79899 Other long term (current) drug therapy: Secondary | ICD-10-CM | POA: Diagnosis not present

## 2020-01-13 DIAGNOSIS — Z20822 Contact with and (suspected) exposure to covid-19: Secondary | ICD-10-CM | POA: Diagnosis not present

## 2020-01-13 DIAGNOSIS — Z794 Long term (current) use of insulin: Secondary | ICD-10-CM | POA: Diagnosis not present

## 2020-01-13 DIAGNOSIS — Z885 Allergy status to narcotic agent status: Secondary | ICD-10-CM | POA: Diagnosis not present

## 2020-01-13 DIAGNOSIS — M810 Age-related osteoporosis without current pathological fracture: Secondary | ICD-10-CM | POA: Diagnosis not present

## 2020-01-13 DIAGNOSIS — E785 Hyperlipidemia, unspecified: Secondary | ICD-10-CM | POA: Diagnosis not present

## 2020-01-13 DIAGNOSIS — I129 Hypertensive chronic kidney disease with stage 1 through stage 4 chronic kidney disease, or unspecified chronic kidney disease: Secondary | ICD-10-CM | POA: Diagnosis not present

## 2020-01-13 DIAGNOSIS — E538 Deficiency of other specified B group vitamins: Secondary | ICD-10-CM | POA: Diagnosis not present

## 2020-01-13 DIAGNOSIS — R7989 Other specified abnormal findings of blood chemistry: Secondary | ICD-10-CM | POA: Diagnosis not present

## 2020-01-13 DIAGNOSIS — R079 Chest pain, unspecified: Secondary | ICD-10-CM | POA: Diagnosis not present

## 2020-01-13 DIAGNOSIS — E1122 Type 2 diabetes mellitus with diabetic chronic kidney disease: Secondary | ICD-10-CM | POA: Diagnosis not present

## 2020-01-13 DIAGNOSIS — Z888 Allergy status to other drugs, medicaments and biological substances status: Secondary | ICD-10-CM | POA: Diagnosis not present

## 2020-01-13 DIAGNOSIS — N183 Chronic kidney disease, stage 3 unspecified: Secondary | ICD-10-CM | POA: Diagnosis not present

## 2020-01-13 DIAGNOSIS — Z5329 Procedure and treatment not carried out because of patient's decision for other reasons: Secondary | ICD-10-CM | POA: Diagnosis not present

## 2020-01-13 DIAGNOSIS — K219 Gastro-esophageal reflux disease without esophagitis: Secondary | ICD-10-CM | POA: Diagnosis not present

## 2020-01-13 DIAGNOSIS — R112 Nausea with vomiting, unspecified: Secondary | ICD-10-CM | POA: Diagnosis not present

## 2020-01-13 DIAGNOSIS — Z882 Allergy status to sulfonamides status: Secondary | ICD-10-CM | POA: Diagnosis not present

## 2020-01-14 ENCOUNTER — Ambulatory Visit: Payer: Medicare Other | Admitting: Orthopaedic Surgery

## 2020-01-18 ENCOUNTER — Encounter: Payer: Self-pay | Admitting: *Deleted

## 2020-01-18 ENCOUNTER — Telehealth: Payer: Self-pay | Admitting: Cardiology

## 2020-01-18 NOTE — Telephone Encounter (Signed)
Pt agreeable to phone appt tomorrow - records faxed to [facility] including EKG/CXR/labs

## 2020-01-18 NOTE — Telephone Encounter (Signed)
Can we do a virtual visit tomorrow at 220pm., will need records sent to [facility] office   Zandra Abts MD

## 2020-01-18 NOTE — Telephone Encounter (Signed)
Pt says she went to [facility] 3/18 with chest pain - says she was there about 12 hours and did some test and was told they were normal they requested pt to stay overnight for observation but pt declinced (will request records) BP today 148/76 HR 80 - pt says this morning tingling in her chest - denies chest pain/SOB/dizziness - will request records and forward to Dr Harl Bowie

## 2020-01-18 NOTE — Telephone Encounter (Signed)
Patient called stated that last Tuesday 3/17 she started having Chest pains. On Wednesday, 3/18 she went to the ER at [facility].  . States that they wanted to keep her but she said no.  States that now she is having nausea, fatigue and needles sticking in her.

## 2020-01-19 ENCOUNTER — Encounter: Payer: Self-pay | Admitting: Cardiology

## 2020-01-19 ENCOUNTER — Telehealth (INDEPENDENT_AMBULATORY_CARE_PROVIDER_SITE_OTHER): Payer: Medicare Other | Admitting: Cardiology

## 2020-01-19 VITALS — BP 215/116 | HR 92 | Ht 63.0 in | Wt 124.0 lb

## 2020-01-19 DIAGNOSIS — R0789 Other chest pain: Secondary | ICD-10-CM | POA: Diagnosis not present

## 2020-01-19 DIAGNOSIS — I10 Essential (primary) hypertension: Secondary | ICD-10-CM

## 2020-01-19 MED ORDER — HYDRALAZINE HCL 25 MG PO TABS: ORAL_TABLET | ORAL | 3 refills | Status: DC

## 2020-01-19 NOTE — Progress Notes (Signed)
Virtual Visit via Telephone Note   This visit type was conducted due to national recommendations for restrictions regarding the COVID-19 Pandemic (e.g. social distancing) in an effort to limit this patient's exposure and mitigate transmission in our community.  Due to her co-morbid illnesses, this patient is at least at moderate risk for complications without adequate follow up.  This format is felt to be most appropriate for this patient at this time.  The patient did not have access to video technology/had technical difficulties with video requiring transitioning to audio format only (telephone).  All issues noted in this document were discussed and addressed.  No physical exam could be performed with this format.  Please refer to the patient's chart for her  consent to telehealth for [facility].   The patient was identified using 2 identifiers.  Date:  01/19/2020   ID:  Heather [name], DOB [birth year], MRN [id number]  Patient Location: Home Provider Location: Office  PCP:  [name], MD  Cardiologist:  Dr Carlyle Dolly MD Electrophysiologist:  None   Evaluation Performed:  Follow-Up Visit  Chief Complaint:  Focused visit on recent chest pain, ER visit.   History of Present Illness:    Heather [name] is a 69 y.o. female seen today for a focused follow for recent episodes of chest pains and recent ER visit    1. Chronic chest pain -normal cath 2003, 2005, 2012Negative nuclear stress test in 2010 and 05/2013 -11/2016 echo normal LVEF, no WMAs  - recent ER visit to [facility] with nausea and chest pain - symptoms started last Tuesday night, went ER Wed - symptoms started while at rest. Pain left sided between breasts, sharp needle like pain. 8/10 in severity. Has had significant nausea, throwing up. Radiates to back. Worst with deep breaths. Can be constant over 12 hours. Still having some vomiting. Has had some chills, no fever.   - from notes in ER  vomited after getting GI cocktail - after eating often gets nauseus and throws up - Cr 1.59 trop neg x 2 WBC 7.4 COVID neg CXR no acute process. EKG SR, no ischemic changes - left ER AMA   2. HTN - high bp's during recent ER visit 180s/100s - at home getting some SBPs in the 200s   2. Palpitations 07/2017 event monitor no arrhythmias  - has had some palpitation Lasts a few minutes at a time.     The patient does not have symptoms concerning for COVID-19 infection (fever, chills, cough, or new shortness of breath).    Past Medical History:  Diagnosis Date  . Acute renal failure superimposed on stage 3 chronic kidney disease ([place]) 01/21/2016  . Anemia   . Anxiety   . B12 deficiency   . Cellulitis and abscess    Abdomen and buttocks  . CKD (chronic kidney disease) stage 3, GFR 30-59 ml/min   . Closed fracture of distal end of femur, unspecified fracture morphology, initial encounter ([place])   . Depression   . Diabetic neuropathy (HCC)    feet  . DJD (degenerative joint disease)    Right forminal stenosis C4-5  . Essential hypertension   . Femur fracture, right ([place]) 03/30/2017  . Folliculitis   . GERD (gastroesophageal reflux disease)   . Gout   . Headache(784.0)   . Hiatal hernia   . History of blood transfusion   . History of bronchitis   . History of cardiac catheterization    No significant CAD 2012  .  History of kidney stones    surgery to remove  . Insomnia   . Irritable bowel syndrome   . Mixed hyperlipidemia   . Osteoarthritis   . Palpitations   . Reflux esophagitis   . Tinnitus    Right  . Type 2 diabetes mellitus ([place])    Past Surgical History:  Procedure Laterality Date  . BACK SURGERY    . c-spine surgery     03/2007  . CARDIAC CATHETERIZATION    . CHOLECYSTECTOMY    . COLONOSCOPY  04/21/2011; 05/29/11   6/12: [place] - ?AVM at IC valve, inflammatory changes at [facility] valve [facility]); 7/12 - [facility]; IC valve erosions, look chronic  and probably Crohn's per path  . colonoscopy  2017   [place]: random biopsies normal. TI normal  . ESOPHAGOGASTRODUODENOSCOPY  05/29/11   normal  . ESOPHAGOGASTRODUODENOSCOPY N/A 01/21/2016   Dr. Michail Sermon: normal  . EYE SURGERY Bilateral    cataracts removed  . FEMUR IM NAIL Right 04/10/2018   Procedure: INTRAMEDULLARY (IM) NAIL FEMORAL;  Surgeon: Paralee Cancel, MD;  Location: [facility];  Service: Orthopedics;  Laterality: Right;  . HARDWARE REMOVAL Right 04/10/2018   Procedure: HARDWARE REMOVAL RIGHT DISTAL FEMUR;  Surgeon: Paralee Cancel, MD;  Location: [facility];  Service: Orthopedics;  Laterality: Right;  . IR FLUORO GUIDE CV LINE RIGHT  04/01/2017  . IR US GUIDE VASC ACCESS RIGHT  04/01/2017  . JOINT REPLACEMENT Right    hip  . OPEN REDUCTION INTERNAL FIXATION (ORIF) DISTAL RADIAL FRACTURE Right 05/28/2019   Procedure: OPEN REDUCTION INTERNAL FIXATION (ORIF) DISTAL RADIAL FRACTURE;  Surgeon: Verner Mould, MD;  Location: [facility];  Service: Orthopedics;  Laterality: Right;  with block 90 minutes  . ORIF FEMUR FRACTURE Right 03/31/2017   Procedure: OPEN REDUCTION INTERNAL FIXATION (ORIF) DISTAL FEMUR FRACTURE;  Surgeon: Paralee Cancel, MD;  Location: [facility];  Service: Orthopedics;  Laterality: Right;  . removal of kidney stone    . Right knee arthroscopic surgery    . TONSILLECTOMY    . TOTAL ABDOMINAL HYSTERECTOMY       Current Meds  Medication Sig  . allopurinol (ZYLOPRIM) 100 MG tablet Take 100 mg by mouth daily.  . Ascorbic Acid (VITAMIN C WITH ROSE HIPS) 500 MG tablet Take 500 mg by mouth every morning.  Marland Kitchen aspirin EC 81 MG tablet Take 81 mg by mouth every morning.   Marland Kitchen atorvastatin (LIPITOR) 80 MG tablet Take 80 mg by mouth every morning.  . Cholecalciferol (VITAMIN D) 2000 units CAPS Take 2,000 Units by mouth every morning.   . citalopram (CELEXA) 40 MG tablet Take 40 mg by mouth daily.  . cyanocobalamin (,VITAMIN B-12,) 1000 MCG/ML injection Inject 1,000 mcg into the muscle every  30 (thirty) days.    . diazepam (VALIUM) 5 MG tablet Take 1 tablet (5 mg total) by mouth 2 (two) times daily as needed for anxiety.  Marland Kitchen esomeprazole (NEXIUM) 40 MG capsule Take 40 mg by mouth daily before breakfast.    . furosemide (LASIX) 40 MG tablet Take 1 tablet (40 mg total) by mouth daily. (Patient taking differently: Take 40 mg by mouth every morning. )  . HUMALOG KWIKPEN 100 UNIT/ML KiwkPen as directed.  . indomethacin (INDOCIN) 50 MG capsule Take 50 mg by mouth 3 (three) times daily as needed.  . Insulin Detemir (LEVEMIR FLEXTOUCH) 100 UNIT/ML Pen Inject 80 Units into the skin at bedtime. (Patient taking differently: Inject 20 Units into the skin at bedtime.  Patient adjust for her sugars)  . Insulin Lispro (HUMALOG KWIKPEN ) Inject 1-21 Units into the skin 3 (three) times daily with meals. Per home sliding scale  . Melatonin 10 MG TABS Take 10 mg by mouth at bedtime as needed.   . metoprolol (LOPRESSOR) 100 MG tablet Take 100 mg by mouth 2 (two) times daily.   . ondansetron (ZOFRAN) 4 MG tablet Take 1 tablet (4 mg total) by mouth every 8 (eight) hours as needed for nausea or vomiting.  Marland Kitchen oxyCODONE (ROXICODONE) 5 MG immediate release tablet Take 1 tablet (5 mg total) by mouth every 6 (six) hours as needed for severe pain.  Marland Kitchen OZEMPIC, 0.25 OR 0.5 MG/DOSE, 2 MG/1.5ML SOPN Inject 0.5 Units into the skin once a week. On Monday  . potassium chloride SA (K-DUR,KLOR-CON) 20 MEQ tablet Take 20 mEq by mouth every morning.   Marland Kitchen tiZANidine (ZANAFLEX) 4 MG tablet Take 8 mg by mouth at bedtime.     Allergies:   Duloxetine hcl, Fluocinolone, Acetaminophen, Amlodipine besylate, Ciprofloxacin, Hydrocodone-acetaminophen, Metformin, Nsaids, Quinolones, Sulfamethoxazole, Sulfonamide derivatives, Valsartan, Cozaar [losartan], Lisinopril, and Tape   Social History   Tobacco Use  . Smoking status: Never Smoker  . Smokeless tobacco: Never Used  Substance Use Topics  . Alcohol use: No  . Drug use: No      Family Hx: The patient's family history includes Colon cancer in her brother; Diabetes in her mother.  ROS:   Please see the history of present illness.     All other systems reviewed and are negative.   Prior CV studies:   The following studies were reviewed today:  Cath 2005: normal coronaries  05/2013 nuclear stress: no ischemia  04/2017 echo Study Conclusions  - Left ventricle: The cavity size was normal. There was mild concentric hypertrophy. Systolic function was normal. The estimated ejection fraction was in the range of 55% to 60%. Wall motion was normal; there were no regional wall motion abnormalities. Doppler parameters are consistent with abnormal left ventricular relaxation (grade 1 diastolic dysfunction). Borderline evidence of increased filling pressures. - Aortic valve: Trileaflet; mildly thickened leaflets. No significant regurgitation. Mean gradient: 56mm Hg (S). Valve area: 2.38cm^2 (Vmax). - Mitral valve: Mild regurgitation. Valve area by continuity equation (using LVOT flow): 4.83cm^2. - Left atrium: The atrium was mildly dilated. - Right atrium: Central venous pressure: 36mm Hg (est). - Tricuspid valve: Mild regurgitation. - Pulmonary arteries: PA peak pressure: 27mm Hg (S). - Pericardium, extracardiac: There was no pericardial effusion. Impressions:  - Unable to compare with previous study from December 2012. There is mild LVH with LVEF 02-54%, grade 1 diastolic dysfunction and borderline evidence of increased filling pressures. Mild left atrial enlargement. Mild mitral regurgitation. Mild tricuspid regurgitation with PASP 32 mm mercury and normal CVP. Transthoracic echocardiography. M-mode, complete 2D, spectral Doppler, and color Doppler. Height: Height: 157.5cm. Height: 62in. Weight: Weight: 77.1kg. Weight: 169.6lb. Body mass index: BMI: 31.1kg/m^2. Body surface area: BSA: 1.37m^2. Blood  pressure: 150/85. Patient status: Outpatient.   11/2016 echo Study Conclusions  - Left ventricle: The cavity size was normal. Wall thickness was increased in a pattern of mild LVH. Systolic function was vigorous. The estimated ejection fraction was in the range of 65% to 70%. Wall motion was normal; there were no regional wall motion abnormalities. The study is not technically sufficient to allow evaluation of LV diastolic function. - Aortic valve: Mildly calcified annulus. Trileaflet; mildly calcified leaflets. - Mitral valve: Calcified annulus. There was trivial regurgitation. - Right  atrium: Central venous pressure (est): 3 mm Hg. - Atrial septum: No defect or patent foramen ovale was identified. - Tricuspid valve: There was trivial regurgitation. - Pulmonary arteries: Systolic pressure could not be accurately estimated. - Pericardium, extracardiac: There was no pericardial effusion.  Impressions:  - Mild LVH with LVEF 65-70%, no regional wall motion abnormalities. Indeterminate diastolic function. Mildly calcified mitral annulus with trivial mitral regurgitation. Mildly calcified aortic valve. Trivial tricuspid regurgitation.  11/2010 cath HEMODYNAMIC DATA: 1. Right atrial pressure 9-11. 2. RV 32/9-12. 3. Pulmonary artery 32/18, mean 24. 4. Pulmonary capillary wedge 17. 5. Aortic 111/60, mean 81. 6. LV 102/17. 7. No gradient pullback across aortic valve. 8. No mitral valve gradient and simultaneous pressure measurements. 9. Superior vena cava saturation 52%. 10.Pulmonary artery saturation 52%. 11.Aortic saturation 92%. 12.Fick cardiac output 4.7 liters per minute. 13.Fick cardiac index 2.6 liters per minute per meter squared. 14.Thermodilution cardiac output 3.9 liters per minute. 15.Thermodilution cardiac index 2.1 liters per minute per meter squared.  ANGIOGRAPHIC DATA: 1. The left main coronary artery is free of critical  disease. 2. The LAD courses towards the apex. There are twin vessels down over the anteroseptal, anterolateral segment. One is the diagonal, the other is likely the LAD with multiple septal perforators. This vessel was smooth throughout without significant focal narrowing. 3. The circumflex provides a large marginal Keyanni Whittinghill and a smaller AV circumflex, both of which appear free of critical disease. 4. The right coronary artery is moderately large vessel providing posterior descending and posterolateral Celicia Minahan. There may be some minor plaquing at the bifurcation of the PLA, PDA, but no significant areas of high-grade focal stenosis noted.  CONCLUSION: 1. Borderline elevation of right heart pressures without significant pulmonary hypertension. 2. No evidence of either aortic or mitral valve gradient. 3. Somewhat low saturations in the superior vena cava and pulmonary artery rechecked during the procedure. 4. No significant coronary obstruction.   07/2017 event monitor  Telemetry tracings show normal sinus rhythm and sinus tachycardia  No signficant arrhythmias.  No symptoms reported  Labs/Other Tests and Data Reviewed:    EKG:  No ECG reviewed.  Recent Labs: 05/28/2019: BUN 70; Creatinine, Ser 2.59; Hemoglobin 13.9; Platelets 222; Potassium 3.9; Sodium 139   Recent Lipid Panel Lab Results  Component Value Date/Time   CHOL 436 (H) 12/25/2016 06:39 PM   TRIG 640.0 (H) 01/04/2017 04:56 PM   HDL NOT REPORTED DUE TO HIGH TRIGLYCERIDES 12/25/2016 06:39 PM   CHOLHDL NOT REPORTED DUE TO HIGH TRIGLYCERIDES 12/25/2016 06:39 PM   LDLCALC UNABLE TO CALCULATE IF TRIGLYCERIDE OVER 400 mg/dL 12/25/2016 06:39 PM    Wt Readings from Last 3 Encounters:  01/19/20 124 lb (56.2 kg)  01/12/20 127 lb (57.6 kg)  12/24/19 125 lb 9.6 oz (57 kg)     Objective:    Vital Signs:  BP (!) 215/116   Pulse 92   Ht 5\' 3"  (1.6 m)   Wt 124 lb (56.2 kg)    LMP  (LMP Unknown)   BMI 21.97 kg/m    Normal affect. Normal speech pattern and tone. Comfortable, no apparent distress. No audible signs of SOB or wheezing.   ASSESSMENT & PLAN:    1. Chest pain - long history, negative ischemic workups in the past -recent symptoms not consistent with cardiac cause, most suggestive of GI etiology. Would not plan on ischemic testing at this time, needs close f/u with pcp   2. HTN - elevated bp in setting of chest pains, N/V,  difficulty keeping pills down - prior to recent issues bp's had done well - start hydralazine 25mg  po q8hrs prn SBP above 150. I think once these acute symptoms resolve her bp likely will go back to prior norms.    COVID-19 Education: The signs and symptoms of COVID-19 were discussed with the patient and how to seek care for testing (follow up with PCP or arrange E-visit).  The importance of social distancing was discussed today.  Time:   Today, I have spent 22 minutes with the patient with telehealth technology discussing the above problems.     Medication Adjustments/Labs and Tests Ordered: Current medicines are reviewed at length with the patient today.  Concerns regarding medicines are outlined above.   Tests Ordered: No orders of the defined types were placed in this encounter.   Medication Changes: No orders of the defined types were placed in this encounter.   Follow Up:  Either In Person or Virtual in 6 month(s)  Signed, Carlyle Dolly, MD  01/19/2020 12:29 PM    [place]

## 2020-01-19 NOTE — Addendum Note (Signed)
Addended by: Debbora Lacrosse R on: 01/19/2020 04:28 PM   Modules accepted: Orders

## 2020-01-19 NOTE — Patient Instructions (Signed)
Medication Instructions:  TAKE HYDRALAZINE 25 MG EVERY 8 HOURS AS NEEDED FOR SYSTOLIC BLOOD PRESSURE OVER 150.   Labwork: NONE  Testing/Procedures: NONE  Follow-Up: Your physician wants you to follow-up in: 6 MONTHS. You will receive a reminder letter in the mail two months in advance. If you don't receive a letter, please call our office to schedule the follow-up appointment.   Any Other Special Instructions Will Be Listed Below (If Applicable).     If you need a refill on your cardiac medications before your next appointment, please call your pharmacy.

## 2020-01-22 DIAGNOSIS — M4326 Fusion of spine, lumbar region: Secondary | ICD-10-CM | POA: Diagnosis not present

## 2020-01-22 DIAGNOSIS — M4807 Spinal stenosis, lumbosacral region: Secondary | ICD-10-CM | POA: Diagnosis not present

## 2020-02-03 ENCOUNTER — Telehealth: Payer: Self-pay | Admitting: *Deleted

## 2020-02-03 ENCOUNTER — Encounter: Payer: Self-pay | Admitting: Physician Assistant

## 2020-02-03 ENCOUNTER — Ambulatory Visit (INDEPENDENT_AMBULATORY_CARE_PROVIDER_SITE_OTHER): Payer: Medicare Other | Admitting: Physician Assistant

## 2020-02-03 ENCOUNTER — Other Ambulatory Visit: Payer: Self-pay

## 2020-02-03 ENCOUNTER — Other Ambulatory Visit (INDEPENDENT_AMBULATORY_CARE_PROVIDER_SITE_OTHER): Payer: Medicare Other

## 2020-02-03 VITALS — BP 170/98 | HR 89 | Temp 98.2°F | Ht 63.0 in | Wt 129.0 lb

## 2020-02-03 DIAGNOSIS — R112 Nausea with vomiting, unspecified: Secondary | ICD-10-CM

## 2020-02-03 DIAGNOSIS — Z01818 Encounter for other preprocedural examination: Secondary | ICD-10-CM

## 2020-02-03 DIAGNOSIS — R634 Abnormal weight loss: Secondary | ICD-10-CM

## 2020-02-03 DIAGNOSIS — R1013 Epigastric pain: Secondary | ICD-10-CM

## 2020-02-03 DIAGNOSIS — R1084 Generalized abdominal pain: Secondary | ICD-10-CM

## 2020-02-03 DIAGNOSIS — Z8719 Personal history of other diseases of the digestive system: Secondary | ICD-10-CM

## 2020-02-03 DIAGNOSIS — Z1159 Encounter for screening for other viral diseases: Secondary | ICD-10-CM | POA: Diagnosis not present

## 2020-02-03 LAB — CBC WITH DIFFERENTIAL/PLATELET
Basophils Absolute: 0.1 10*3/uL (ref 0.0–0.1)
Basophils Relative: 0.9 % (ref 0.0–3.0)
Eosinophils Absolute: 0.3 10*3/uL (ref 0.0–0.7)
Eosinophils Relative: 4.3 % (ref 0.0–5.0)
HCT: 32.6 % — ABNORMAL LOW (ref 36.0–46.0)
Hemoglobin: 10.9 g/dL — ABNORMAL LOW (ref 12.0–15.0)
Lymphocytes Relative: 28.5 % (ref 12.0–46.0)
Lymphs Abs: 1.8 10*3/uL (ref 0.7–4.0)
MCHC: 33.5 g/dL (ref 30.0–36.0)
MCV: 96.8 fl (ref 78.0–100.0)
Monocytes Absolute: 0.4 10*3/uL (ref 0.1–1.0)
Monocytes Relative: 5.8 % (ref 3.0–12.0)
Neutro Abs: 3.9 10*3/uL (ref 1.4–7.7)
Neutrophils Relative %: 60.5 % (ref 43.0–77.0)
Platelets: 218 10*3/uL (ref 150.0–400.0)
RBC: 3.37 Mil/uL — ABNORMAL LOW (ref 3.87–5.11)
RDW: 17.3 % — ABNORMAL HIGH (ref 11.5–15.5)
WBC: 6.5 10*3/uL (ref 4.0–10.5)

## 2020-02-03 LAB — COMPREHENSIVE METABOLIC PANEL
ALT: 11 U/L (ref 0–35)
AST: 16 U/L (ref 0–37)
Albumin: 4.3 g/dL (ref 3.5–5.2)
Alkaline Phosphatase: 75 U/L (ref 39–117)
BUN: 18 mg/dL (ref 6–23)
CO2: 26 mEq/L (ref 19–32)
Calcium: 8.4 mg/dL (ref 8.4–10.5)
Chloride: 104 mEq/L (ref 96–112)
Creatinine, Ser: 1.15 mg/dL (ref 0.40–1.20)
GFR: 46.99 mL/min — ABNORMAL LOW (ref >60.00)
Glucose, Bld: 159 mg/dL — ABNORMAL HIGH (ref 70–99)
Potassium: 3.2 mEq/L — ABNORMAL LOW (ref 3.5–5.1)
Sodium: 140 mEq/L (ref 135–145)
Total Bilirubin: 0.4 mg/dL (ref 0.2–1.2)
Total Protein: 7 g/dL (ref 6.0–8.3)

## 2020-02-03 LAB — LIPASE: Lipase: 81 U/L — ABNORMAL HIGH (ref 11.0–59.0)

## 2020-02-03 MED ORDER — PROMETHAZINE HCL 25 MG PO TABS: 25.0000 mg | ORAL_TABLET | Freq: Four times a day (QID) | ORAL | 1 refills | Status: DC | PRN

## 2020-02-03 MED ORDER — OXYCODONE HCL 5 MG PO TABS: 5.0000 mg | ORAL_TABLET | Freq: Four times a day (QID) | ORAL | 0 refills | Status: DC | PRN

## 2020-02-03 MED ORDER — OXYCODONE HCL 5 MG PO TABS: 5.0000 mg | ORAL_TABLET | Freq: Four times a day (QID) | ORAL | Status: DC | PRN

## 2020-02-03 NOTE — Progress Notes (Signed)
Chief Complaint: Nausea and vomiting  HPI:    Heather [name] is a 69 year old Caucasian female, known to Heather [name] with a past medical history as listed below including CKD, diabetic neuropathy, reflux and others, who presents to clinic today for complaint of nausea and vomiting.    08/06/2018 patient seen in clinic by Heather [name] for diarrhea, loss of weight and generalized abdominal cramping.  Patient had a variety of work-ups which were discussed which were normal other than some ileal erosions which had been unrevealing, irritable bowel syndrome certainly seem like the most likely problem.  She was prescribed Glycopyrrolate.  Also discussed that she consider a laxative purge with MiraLAX.    Today, the patient presents to clinic and tells me that since being seen last in 2019 she has continued with a decrease in appetite only eating a few bites of food at every meal.  Then over the past 3 weeks she started with a severe epigastric pain rated as a 10/10 which seems to radiate through to her back.  This started "out of the blue", apparently seen in the ER in [place] where they did a CT and told her that her "pancreas was inflamed".  (We do not have these records today).  Apparently they wanted to admit the patient but she had a funeral to go to so she left.  They did tell her to remain on a clear liquid diet, she did this for a while it sounds like but it did not help at all.  Currently she is not able to eat much of anything without vomiting it back up over the past few days.  She describes this pain as "needles".   Did have some pain medication from that ER visit but she is out now, which was helping.  Tells me she is having normal bowel movements.  Also not sleeping and describes a weight loss of around 50 pounds over the past few years.    Denies fever, chills or blood in her stool.  Past Medical History:  Diagnosis Date  . Acute renal failure superimposed on stage 3 chronic kidney disease ([place])  01/21/2016  . Anemia   . Anxiety   . B12 deficiency   . Cellulitis and abscess    Abdomen and buttocks  . CKD (chronic kidney disease) stage 3, GFR 30-59 ml/min   . Closed fracture of distal end of femur, unspecified fracture morphology, initial encounter ([place])   . Depression   . Diabetic neuropathy (HCC)    feet  . DJD (degenerative joint disease)    Right forminal stenosis C4-5  . Essential hypertension   . Femur fracture, right ([place]) 03/30/2017  . Folliculitis   . GERD (gastroesophageal reflux disease)   . Gout   . Headache(784.0)   . Hiatal hernia   . History of blood transfusion   . History of bronchitis   . History of cardiac catheterization    No significant CAD 2012  . History of kidney stones    surgery to remove  . Insomnia   . Irritable bowel syndrome   . Mixed hyperlipidemia   . Osteoarthritis   . Palpitations   . Reflux esophagitis   . Tinnitus    Right  . Type 2 diabetes mellitus ([place])     Past Surgical History:  Procedure Laterality Date  . BACK SURGERY    . c-spine surgery     03/2007  . CARDIAC CATHETERIZATION    . CHOLECYSTECTOMY    .  COLONOSCOPY  04/21/2011; 05/29/11   6/12: [place] - ?AVM at IC valve, inflammatory changes at [facility] valve [facility]); 7/12 - [facility]; IC valve erosions, look chronic and probably Crohn's per path  . colonoscopy  2017   [employer]: random biopsies normal. TI normal  . ESOPHAGOGASTRODUODENOSCOPY  05/29/11   normal  . ESOPHAGOGASTRODUODENOSCOPY N/A 01/21/2016   Dr. Michail Sermon: normal  . EYE SURGERY Bilateral    cataracts removed  . FEMUR IM NAIL Right 04/10/2018   Procedure: INTRAMEDULLARY (IM) NAIL FEMORAL;  Surgeon: Paralee Cancel, MD;  Location: [facility];  Service: Orthopedics;  Laterality: Right;  . HARDWARE REMOVAL Right 04/10/2018   Procedure: HARDWARE REMOVAL RIGHT DISTAL FEMUR;  Surgeon: Paralee Cancel, MD;  Location: [facility];  Service: Orthopedics;  Laterality: Right;  . IR FLUORO GUIDE CV LINE RIGHT  04/01/2017  .  IR US GUIDE VASC ACCESS RIGHT  04/01/2017  . JOINT REPLACEMENT Right    hip  . OPEN REDUCTION INTERNAL FIXATION (ORIF) DISTAL RADIAL FRACTURE Right 05/28/2019   Procedure: OPEN REDUCTION INTERNAL FIXATION (ORIF) DISTAL RADIAL FRACTURE;  Surgeon: Verner Mould, MD;  Location: [facility];  Service: Orthopedics;  Laterality: Right;  with block 90 minutes  . ORIF FEMUR FRACTURE Right 03/31/2017   Procedure: OPEN REDUCTION INTERNAL FIXATION (ORIF) DISTAL FEMUR FRACTURE;  Surgeon: Paralee Cancel, MD;  Location: [facility];  Service: Orthopedics;  Laterality: Right;  . removal of kidney stone    . Right knee arthroscopic surgery    . TONSILLECTOMY    . TOTAL ABDOMINAL HYSTERECTOMY      Current Outpatient Medications  Medication Sig Dispense Refill  . allopurinol (ZYLOPRIM) 100 MG tablet Take 100 mg by mouth daily.    . Ascorbic Acid (VITAMIN C WITH ROSE HIPS) 500 MG tablet Take 500 mg by mouth every morning.    Marland Kitchen aspirin EC 81 MG tablet Take 81 mg by mouth every morning.     Marland Kitchen atorvastatin (LIPITOR) 80 MG tablet Take 80 mg by mouth every morning.    . Cholecalciferol (VITAMIN D) 2000 units CAPS Take 2,000 Units by mouth every morning.     . citalopram (CELEXA) 40 MG tablet Take 40 mg by mouth daily.    . cyanocobalamin (,VITAMIN B-12,) 1000 MCG/ML injection Inject 1,000 mcg into the muscle every 30 (thirty) days.      . diazepam (VALIUM) 5 MG tablet Take 1 tablet (5 mg total) by mouth 2 (two) times daily as needed for anxiety. 6 tablet 0  . esomeprazole (NEXIUM) 40 MG capsule Take 40 mg by mouth daily before breakfast.      . furosemide (LASIX) 40 MG tablet Take 1 tablet (40 mg total) by mouth daily. (Patient taking differently: Take 40 mg by mouth every morning. ) 30 tablet 0  . HUMALOG KWIKPEN 100 UNIT/ML KiwkPen as directed.  3  . hydrALAZINE (APRESOLINE) 25 MG tablet Take 25 mg every 8 hours as needed for systolic blood pressure over 150. 270 tablet 3  . indomethacin (INDOCIN) 50 MG capsule Take 50  mg by mouth 3 (three) times daily as needed.    . Insulin Detemir (LEVEMIR FLEXTOUCH) 100 UNIT/ML Pen Inject 80 Units into the skin at bedtime. (Patient taking differently: Inject 20 Units into the skin at bedtime. Patient adjust for her sugars)    . Insulin Lispro (HUMALOG KWIKPEN [place]) Inject 1-21 Units into the skin 3 (three) times daily with meals. Per home sliding scale    . Melatonin 10 MG TABS  Take 10 mg by mouth at bedtime as needed.     . metoprolol (LOPRESSOR) 100 MG tablet Take 100 mg by mouth 2 (two) times daily.     . ondansetron (ZOFRAN) 4 MG tablet Take 1 tablet (4 mg total) by mouth every 8 (eight) hours as needed for nausea or vomiting. 30 tablet 1  . oxyCODONE (ROXICODONE) 5 MG immediate release tablet Take 1 tablet (5 mg total) by mouth every 6 (six) hours as needed for severe pain. 8 tablet 0  . OZEMPIC, 0.25 OR 0.5 MG/DOSE, 2 MG/1.5ML SOPN Inject 0.5 Units into the skin once a week. On Monday    . potassium chloride SA (K-DUR,KLOR-CON) 20 MEQ tablet Take 20 mEq by mouth every morning.     Marland Kitchen tiZANidine (ZANAFLEX) 4 MG tablet Take 8 mg by mouth at bedtime.     No current facility-administered medications for this visit.    Allergies as of 02/03/2020 - Review Complete 01/19/2020  Allergen Reaction Noted  . Duloxetine hcl Swelling 01/23/2016  . Fluocinolone Swelling 01/23/2016  . Acetaminophen Itching and Swelling 12/25/2016  . Amlodipine besylate Swelling   . Ciprofloxacin Nausea And Vomiting and Swelling   . Hydrocodone-acetaminophen Itching 12/22/2014  . Metformin Other (See Comments)   . Nsaids Other (See Comments) 08/29/2016  . Quinolones Swelling 12/22/2014  . Sulfamethoxazole Swelling 12/22/2014  . Sulfonamide derivatives Swelling   . Valsartan Swelling 12/22/2014  . Cozaar [losartan] Swelling and Rash 08/29/2016  . Lisinopril Swelling and Rash   . Tape Itching and Rash 06/15/2018    Family History  Problem Relation Age of Onset  . Diabetes Mother   . Colon  cancer Brother        POSSIBLY. Patient states diagnosed at age 16, then later states age 71. unclear and limited historian    Social History   Socioeconomic History  . Marital status: Married    Spouse name: Not on file  . Number of children: 2  . Years of education: Not on file  . Highest education level: Not on file  Occupational History  . Occupation: Disabled  Tobacco Use  . Smoking status: Never Smoker  . Smokeless tobacco: Never Used  Substance and Sexual Activity  . Alcohol use: No  . Drug use: No  . Sexual activity: Not on file    Comment: Hysterectomy  Other Topics Concern  . 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 Difficulty of Paying Living Expenses:   Food Insecurity:   . Worried About Charity fundraiser in the Last Year:   . Arboriculturist in the Last Year:   Transportation Needs:   . Film/video editor (Medical):   Marland Kitchen Lack of Transportation (Non-Medical):   Physical Activity:   . Days of Exercise per Week:   . Minutes of Exercise per Session:   Stress:   . Feeling of Stress :   Social Connections:   . Frequency of Communication with Friends and Family:   . Frequency of Social Gatherings with Friends and Family:   . Attends Religious Services:   . Active Member of Clubs or Organizations:   . Attends  Club or Organization Meetings:   Marland Kitchen Marital Status:   Intimate Partner Violence:   . Fear of Current or Ex-Partner:   . Emotionally Abused:   Marland Kitchen Physically Abused:   . Sexually Abused:     Review of Systems:    Constitutional: No weight loss, fever or chills Skin: No rash  Cardiovascular: No chest pain Respiratory: No SOB  Gastrointestinal: See HPI and otherwise negative    Physical Exam:  Vital signs: BP (!) 170/98   Pulse 89   Temp 98.2 F (36.8 C)   Ht 5\' 3"  (1.6 m)   Wt 129 lb (58.5 kg)   LMP  (LMP Unknown)   BMI 22.85 kg/m   Constitutional:   Pleasant Elderly Caucasian female appears to be in NAD, Well developed, Well nourished, alert and cooperative Respiratory: Respirations even and unlabored. Lungs clear to auscultation bilaterally.   No wheezes, crackles, or rhonchi.  Cardiovascular: Normal S1, S2. No MRG. Regular rate and rhythm. No peripheral edema, cyanosis or pallor.  Gastrointestinal:  Soft, nondistended, marked epigastric ttp with involuntary guarding, Normal bowel sounds. No appreciable masses or hepatomegaly. Rectal:  Not performed.  Psychiatric:  Demonstrates good judgement and reason without abnormal affect or behaviors.  Requesting recent labs/imaging.  Assessment: 1.  Epigastric pain: Diagnosed with pancreatitis in [place] 3 weeks ago, likely the cause of epigastric pain +/- PUD vs other 2.  Nausea and vomiting: Over the past 3 weeks with epigastric pain, seen in [facility] ED with pancreatitis, likely related if this is the case  3.  Weight loss: 50 pounds for the patient over the past year and a half related to decrease in appetite and now epigastric pain; consider gastritis versus cancer versus other  Plan: 1.  Some of these symptoms are chronic for the patient, but her acute worsening abdominal pain and inability to keep down foods is new over the past 3 weeks.  Apparently seen in the ER in [place] and diagnosed with what sounds like pancreatitis.  We are going to get those records today. 2.  Had a long discussion with the patient that if she is not able to keep anything down and this pain is that severe that she cannot sleep at night then she may need to proceed to the ER for further evaluation.  Patient would like to wait on this but she was given ER precautions and told to proceed there if things got any worse or she continued not  to be able to drink or eat. 3.  Ordered a CBC, CMP and lipase. 4.  I do believe patient will benefit from an EGD with Heather [name] in the [facility] given weight loss and length of symptoms including decrease in appetite.  Did schedule her for this.  Discussed risks, benefits, limitations and alternatives and the patient agrees to proceed.  She will be Covid tested 2 days prior to time of procedure. 5.  I will prescribe a limited amount of Oxycodone 5mg  q6h for pain, #10 with no refill.  Did discuss with the patient that if she continues with severe pain she needs to go to the ER. 6.  Advised the patient to continue her Zofran as needed and stay on a clear liquid diet. 7.  Patient to follow in clinic per recommendations after labs/procedure above.  Heather Newer, Heather [name] [facility] 02/03/2020, 9:43 AM  Cc: [name], MD

## 2020-02-03 NOTE — Patient Instructions (Addendum)
If you are age 68 or older, your body mass index should be between 23-30. Your Body mass index is 22.85 kg/m. If this is out of the aforementioned range listed, please consider follow up with your Primary Care Provider.  If you are age 42 or younger, your body mass index should be between 19-25. Your Body mass index is 22.85 kg/m. If this is out of the aformentioned range listed, please consider follow up with your Primary Care Provider.   Your provider has requested that you go to the basement level for lab work before leaving today. Press "B" on the elevator. The lab is located at the first door on the left as you exit the elevator.  We have sent the following medications to your pharmacy for you to pick up at your convenience: Hydrocodone, Phenergan.   You have been scheduled for an endoscopy. Please follow written instructions given to you at your visit today. If you use inhalers (even only as needed), please bring them with you on the day of your procedure.

## 2020-02-03 NOTE — Telephone Encounter (Signed)
Dawn from [facility] called to let me know that the patient only had a chest x-ray at her recent visit. She did not have CT scan of any kind.

## 2020-02-04 LAB — SARS CORONAVIRUS 2 (TAT 6-24 HRS): SARS Coronavirus 2: NEGATIVE

## 2020-02-04 NOTE — Telephone Encounter (Signed)
She went to an ER in [place]... this is where we were getting records from. Can you figure out where and make sure we are asking the right people? IF hasn't had one then need to order ct abdomen with contrast  Thanks-JLL

## 2020-02-04 NOTE — Telephone Encounter (Signed)
Scheduled CT scan at [facility] for 02/05/20 @ 3:30 pm. Patient informed of CT. She is unable to pick up contrast today but she will arrive at [facility] tomorrow at 1:30 to start drinking. No precert needed.

## 2020-02-04 NOTE — Telephone Encounter (Signed)
[facility] in [place] it is now [facility]. Will schedule CT scan.

## 2020-02-05 ENCOUNTER — Ambulatory Visit (HOSPITAL_COMMUNITY)
Admission: RE | Admit: 2020-02-05 | Discharge: 2020-02-05 | Disposition: A | Discharge status: Home / Self Care | Payer: Medicare Other | Source: Ambulatory Visit | Attending: Physician Assistant | Admitting: Physician Assistant

## 2020-02-05 ENCOUNTER — Other Ambulatory Visit: Payer: Self-pay

## 2020-02-05 ENCOUNTER — Telehealth: Payer: Self-pay | Admitting: Gastroenterology

## 2020-02-05 DIAGNOSIS — R112 Nausea with vomiting, unspecified: Secondary | ICD-10-CM

## 2020-02-05 DIAGNOSIS — Z8719 Personal history of other diseases of the digestive system: Secondary | ICD-10-CM

## 2020-02-05 DIAGNOSIS — K838 Other specified diseases of biliary tract: Secondary | ICD-10-CM | POA: Diagnosis not present

## 2020-02-05 DIAGNOSIS — R1013 Epigastric pain: Secondary | ICD-10-CM

## 2020-02-05 DIAGNOSIS — R1084 Generalized abdominal pain: Secondary | ICD-10-CM | POA: Diagnosis not present

## 2020-02-05 DIAGNOSIS — K2970 Gastritis, unspecified, without bleeding: Secondary | ICD-10-CM

## 2020-02-05 DIAGNOSIS — K3189 Other diseases of stomach and duodenum: Secondary | ICD-10-CM | POA: Diagnosis not present

## 2020-02-05 DIAGNOSIS — R634 Abnormal weight loss: Secondary | ICD-10-CM

## 2020-02-05 MED ORDER — IOHEXOL 9 MG/ML PO SOLN
ORAL | Status: AC
  Filled 2020-02-05: qty 1000

## 2020-02-05 MED ORDER — SUCRALFATE 1 G PO TABS: 1.0000 g | ORAL_TABLET | Freq: Four times a day (QID) | ORAL | 1 refills | Status: DC | PRN

## 2020-02-05 MED ORDER — IOHEXOL 300 MG/ML  SOLN
75.0000 mL | Freq: Once | INTRAMUSCULAR | Status: AC | PRN
  Administered 2020-02-05: 16:00:00 75 mL via INTRAVENOUS

## 2020-02-05 NOTE — Telephone Encounter (Signed)
Received a call after hours about a stat CT scan result from [facility]. No evidence of pancreatitis or anything acutely wrong. Some mild thickening of her stomach, possibly some gastritis. She has an EGD scheduled for Monday which she should proceed with. Recommend she increase nexium to BID and gave her a trial of carafate over the weekend to see if that helps.  Heather [name]

## 2020-02-08 ENCOUNTER — Encounter: Payer: Self-pay | Admitting: Internal Medicine

## 2020-02-08 ENCOUNTER — Ambulatory Visit (AMBULATORY_SURGERY_CENTER): Payer: Medicare Other | Admitting: Internal Medicine

## 2020-02-08 ENCOUNTER — Other Ambulatory Visit: Payer: Self-pay

## 2020-02-08 VITALS — BP 186/85 | HR 80 | Temp 96.2°F | Resp 12 | Ht 63.0 in | Wt 129.0 lb

## 2020-02-08 DIAGNOSIS — R1013 Epigastric pain: Secondary | ICD-10-CM | POA: Diagnosis not present

## 2020-02-08 MED ORDER — SODIUM CHLORIDE 0.9 % IV SOLN: 500.0000 mL | Freq: Once | INTRAVENOUS | Status: DC

## 2020-02-08 NOTE — Patient Instructions (Addendum)
The esophagus, stomach and upper intestine all look ok.   I think some or all of the pain you have comes from the vomiting straining the muscles and the ribs + cartilage in lower chest.  In the past you have had slow stomach emptying.  I want to reassess that with a solid phase gastric emptying study. My staff will arrange that.  I appreciate the opportunity to care for you. Gatha Mayer, MD, FACG   YOU HAD AN ENDOSCOPIC PROCEDURE TODAY AT [facility]:   Refer to the procedure report that was given to you for any specific questions about what was found during the examination.  If the procedure report does not answer your questions, please call your gastroenterologist to clarify.  If you requested that your care partner not be given the details of your procedure findings, then the procedure report has been included in a sealed envelope for you to review at your convenience later.  YOU SHOULD EXPECT: Some feelings of bloating in the abdomen. Passage of more gas than usual.  Walking can help get rid of the air that was put into your GI tract during the procedure and reduce the bloating. If you had a lower endoscopy (such as a colonoscopy or flexible sigmoidoscopy) you may notice spotting of blood in your stool or on the toilet paper. If you underwent a bowel prep for your procedure, you may not have a normal bowel movement for a few days.  Please Note:  You might notice some irritation and congestion in your nose or some drainage.  This is from the oxygen used during your procedure.  There is no need for concern and it should clear up in a day or so.  SYMPTOMS TO REPORT IMMEDIATELY:   Following upper endoscopy (EGD)  Vomiting of blood or coffee ground material  New chest pain or pain under the shoulder blades  Painful or persistently difficult swallowing  New shortness of breath  Fever of 100F or higher  Black, tarry-looking stools  For urgent or emergent issues, a  gastroenterologist can be reached at any hour by calling [phone]. Do not use MyChart messaging for urgent concerns.    DIET:  We do recommend a small meal at first, but then you may proceed to your regular diet.  Drink plenty of fluids but you should avoid alcoholic beverages for 24 hours.  ACTIVITY:  You should plan to take it easy for the rest of today and you should NOT DRIVE or use heavy machinery until tomorrow (because of the sedation medicines used during the test).    FOLLOW UP: Our staff will call the number listed on your records 48-72 hours following your procedure to check on you and address any questions or concerns that you may have regarding the information given to you following your procedure. If we do not reach you, we will leave a message.  We will attempt to reach you two times.  During this call, we will ask if you have developed any symptoms of COVID 19. If you develop any symptoms (ie: fever, flu-like symptoms, shortness of breath, cough etc.) before then, please call [phone].  If you test positive for Covid 19 in the 2 weeks post procedure, please call and report this information to [place].    If any biopsies were taken you will be contacted by phone or by letter within the next 1-3 weeks.  Please call us at [phone] if you have not heard  about the biopsies in 3 weeks.    SIGNATURES/CONFIDENTIALITY: You and/or your care partner have signed paperwork which will be entered into your electronic medical record.  These signatures attest to the fact that that the information above on your After Visit Summary has been reviewed and is understood.  Full responsibility of the confidentiality of this discharge information lies with you and/or your care-partner.

## 2020-02-08 NOTE — Progress Notes (Signed)
Pt's states no medical or surgical changes since previsit or office visit.  LC - temp DT - vitals

## 2020-02-08 NOTE — Op Note (Signed)
[place] Patient Name: Heather [name] Procedure Date: 02/08/2020 4:11 PM MRN: [id number] Endoscopist: Gatha Mayer , MD Age: 69 Referring MD:  Date of Birth: 1951-10-10 Gender: Female Account #: [id number] Procedure:                Upper GI endoscopy Indications:              Epigastric abdominal pain Procedure:                After obtaining informed consent, the endoscope was                            passed under direct vision. Throughout the                            procedure, the patient's blood pressure, pulse, and                            oxygen saturations were monitored continuously. The                            Endoscope was introduced through the mouth, and                            advanced to the second part of duodenum. The upper                            GI endoscopy was accomplished without difficulty.                            The patient tolerated the procedure well. Scope In: Scope Out: Findings:                 The esophagus was normal.                           The stomach was normal.                           The examined duodenum was normal.                           The cardia and gastric fundus were normal on                            retroflexion. Complications:            No immediate complications. Estimated Blood Loss:     Estimated blood loss: none. Impression:               - Normal esophagus.                           - Normal stomach.                           - Normal examined duodenum.                           -  No specimens collected. - I think her epigastric                            pain is musculoskeletal and a result of the                            vomiting. she has hx delayed gastric emptying 2012                            2 hr gastric emptying study, is also now on ozempic. Recommendation:           - Patient has a contact number available for                            emergencies. The signs and symptoms  of potential                            delayed complications were discussed with the                            patient. Return to normal activities tomorrow.                            Written discharge instructions were provided to the                            patient.                           - Resume previous diet.                           - Continue present medications.                           - My office will schedule 4 hour solid phase                            gastric emptying study to evaluate persistent                            vomiting, history of delayed gastric emptying. Gatha Mayer, MD 02/08/2020 4:39:09 PM This report has been signed electronically.

## 2020-02-08 NOTE — Progress Notes (Signed)
Report given to PACU, vss 

## 2020-02-10 ENCOUNTER — Telehealth: Payer: Self-pay | Admitting: *Deleted

## 2020-02-10 NOTE — Telephone Encounter (Signed)
  Follow up Call-  Call back number 02/08/2020  Post procedure Call Back phone  # [phone]  Permission to leave phone message Yes  Some recent data might be hidden     Patient questions:  Do you have a fever, pain , or abdominal swelling? No. Pain Score  0 *  Have you tolerated food without any problems? Yes.    Have you been able to return to your normal activities? Yes.    Do you have any questions about your discharge instructions: Diet   No. Medications  No. Follow up visit  No.  Do you have questions or concerns about your Care? No.  Actions: * If pain score is 4 or above: No action needed, pain <4.  1. Have you developed a fever since your procedure? no  2.   Have you had an respiratory symptoms (SOB or cough) since your procedure? no  3.   Have you tested positive for COVID 19 since your procedure no  4.   Have you had any family members/close contacts diagnosed with the COVID 19 since your procedure?  no   If yes to any of these questions please route to Joylene John, RN and Erenest Rasher, RN

## 2020-02-11 ENCOUNTER — Telehealth: Payer: Self-pay

## 2020-02-11 DIAGNOSIS — K30 Functional dyspepsia: Secondary | ICD-10-CM

## 2020-02-11 DIAGNOSIS — R112 Nausea with vomiting, unspecified: Secondary | ICD-10-CM

## 2020-02-11 NOTE — Telephone Encounter (Signed)
GES scheduled for 03/02/20 7:30 at [facility].   Left message for patient to call back

## 2020-02-12 ENCOUNTER — Ambulatory Visit (HOSPITAL_COMMUNITY): Payer: Medicare Other

## 2020-02-12 NOTE — Telephone Encounter (Signed)
Patient declines the GES.  She wants to wait for now. Her husband is having surgery soon and declines care for herself for now.

## 2020-02-15 ENCOUNTER — Ambulatory Visit: Payer: Medicare Other | Admitting: Internal Medicine

## 2020-02-17 DIAGNOSIS — Z79899 Other long term (current) drug therapy: Secondary | ICD-10-CM | POA: Diagnosis not present

## 2020-02-17 DIAGNOSIS — E119 Type 2 diabetes mellitus without complications: Secondary | ICD-10-CM | POA: Diagnosis not present

## 2020-02-17 DIAGNOSIS — Z299 Encounter for prophylactic measures, unspecified: Secondary | ICD-10-CM | POA: Diagnosis not present

## 2020-02-17 DIAGNOSIS — I509 Heart failure, unspecified: Secondary | ICD-10-CM | POA: Diagnosis not present

## 2020-02-17 DIAGNOSIS — Z1211 Encounter for screening for malignant neoplasm of colon: Secondary | ICD-10-CM | POA: Diagnosis not present

## 2020-02-17 DIAGNOSIS — R5383 Other fatigue: Secondary | ICD-10-CM | POA: Diagnosis not present

## 2020-02-17 DIAGNOSIS — I10 Essential (primary) hypertension: Secondary | ICD-10-CM | POA: Diagnosis not present

## 2020-02-17 DIAGNOSIS — E1165 Type 2 diabetes mellitus with hyperglycemia: Secondary | ICD-10-CM | POA: Diagnosis not present

## 2020-02-17 DIAGNOSIS — E7800 Pure hypercholesterolemia, unspecified: Secondary | ICD-10-CM | POA: Diagnosis not present

## 2020-02-17 DIAGNOSIS — Z7189 Other specified counseling: Secondary | ICD-10-CM | POA: Diagnosis not present

## 2020-02-17 DIAGNOSIS — Z0000 Encounter for general adult medical examination without abnormal findings: Secondary | ICD-10-CM | POA: Diagnosis not present

## 2020-02-24 DIAGNOSIS — J449 Chronic obstructive pulmonary disease, unspecified: Secondary | ICD-10-CM | POA: Diagnosis not present

## 2020-02-24 DIAGNOSIS — R531 Weakness: Secondary | ICD-10-CM | POA: Diagnosis not present

## 2020-02-24 DIAGNOSIS — Z299 Encounter for prophylactic measures, unspecified: Secondary | ICD-10-CM | POA: Diagnosis not present

## 2020-02-24 DIAGNOSIS — E1165 Type 2 diabetes mellitus with hyperglycemia: Secondary | ICD-10-CM | POA: Diagnosis not present

## 2020-02-24 DIAGNOSIS — I25119 Atherosclerotic heart disease of native coronary artery with unspecified angina pectoris: Secondary | ICD-10-CM | POA: Diagnosis not present

## 2020-02-24 DIAGNOSIS — I10 Essential (primary) hypertension: Secondary | ICD-10-CM | POA: Diagnosis not present

## 2020-02-25 DIAGNOSIS — I10 Essential (primary) hypertension: Secondary | ICD-10-CM | POA: Diagnosis not present

## 2020-02-25 DIAGNOSIS — E119 Type 2 diabetes mellitus without complications: Secondary | ICD-10-CM | POA: Diagnosis not present

## 2020-02-29 DIAGNOSIS — N189 Chronic kidney disease, unspecified: Secondary | ICD-10-CM | POA: Diagnosis not present

## 2020-02-29 DIAGNOSIS — E1122 Type 2 diabetes mellitus with diabetic chronic kidney disease: Secondary | ICD-10-CM | POA: Diagnosis not present

## 2020-02-29 DIAGNOSIS — E211 Secondary hyperparathyroidism, not elsewhere classified: Secondary | ICD-10-CM | POA: Diagnosis not present

## 2020-02-29 DIAGNOSIS — E1129 Type 2 diabetes mellitus with other diabetic kidney complication: Secondary | ICD-10-CM | POA: Diagnosis not present

## 2020-02-29 DIAGNOSIS — R809 Proteinuria, unspecified: Secondary | ICD-10-CM | POA: Diagnosis not present

## 2020-02-29 DIAGNOSIS — I5032 Chronic diastolic (congestive) heart failure: Secondary | ICD-10-CM | POA: Diagnosis not present

## 2020-03-02 ENCOUNTER — Encounter (HOSPITAL_COMMUNITY): Payer: Self-pay | Admitting: *Deleted

## 2020-03-02 ENCOUNTER — Emergency Department (HOSPITAL_COMMUNITY)
Admission: EM | Admit: 2020-03-02 | Discharge: 2020-03-02 | Disposition: A | Discharge status: Home / Self Care | Payer: Medicare Other | Attending: Emergency Medicine | Admitting: Emergency Medicine

## 2020-03-02 ENCOUNTER — Emergency Department (HOSPITAL_COMMUNITY): Payer: Medicare Other

## 2020-03-02 ENCOUNTER — Encounter (HOSPITAL_COMMUNITY): Payer: Medicare Other

## 2020-03-02 ENCOUNTER — Other Ambulatory Visit: Payer: Self-pay

## 2020-03-02 DIAGNOSIS — I129 Hypertensive chronic kidney disease with stage 1 through stage 4 chronic kidney disease, or unspecified chronic kidney disease: Secondary | ICD-10-CM | POA: Diagnosis not present

## 2020-03-02 DIAGNOSIS — N189 Chronic kidney disease, unspecified: Secondary | ICD-10-CM | POA: Diagnosis not present

## 2020-03-02 DIAGNOSIS — E211 Secondary hyperparathyroidism, not elsewhere classified: Secondary | ICD-10-CM | POA: Diagnosis not present

## 2020-03-02 DIAGNOSIS — R7989 Other specified abnormal findings of blood chemistry: Secondary | ICD-10-CM | POA: Diagnosis present

## 2020-03-02 DIAGNOSIS — I10 Essential (primary) hypertension: Secondary | ICD-10-CM | POA: Diagnosis not present

## 2020-03-02 DIAGNOSIS — Z96641 Presence of right artificial hip joint: Secondary | ICD-10-CM | POA: Insufficient documentation

## 2020-03-02 DIAGNOSIS — R519 Headache, unspecified: Secondary | ICD-10-CM | POA: Diagnosis not present

## 2020-03-02 DIAGNOSIS — E1122 Type 2 diabetes mellitus with diabetic chronic kidney disease: Secondary | ICD-10-CM | POA: Insufficient documentation

## 2020-03-02 DIAGNOSIS — K5730 Diverticulosis of large intestine without perforation or abscess without bleeding: Secondary | ICD-10-CM | POA: Diagnosis not present

## 2020-03-02 DIAGNOSIS — R197 Diarrhea, unspecified: Secondary | ICD-10-CM | POA: Diagnosis not present

## 2020-03-02 DIAGNOSIS — R112 Nausea with vomiting, unspecified: Secondary | ICD-10-CM | POA: Diagnosis not present

## 2020-03-02 DIAGNOSIS — Z794 Long term (current) use of insulin: Secondary | ICD-10-CM | POA: Diagnosis not present

## 2020-03-02 DIAGNOSIS — Z79899 Other long term (current) drug therapy: Secondary | ICD-10-CM | POA: Insufficient documentation

## 2020-03-02 DIAGNOSIS — N170 Acute kidney failure with tubular necrosis: Secondary | ICD-10-CM | POA: Diagnosis not present

## 2020-03-02 DIAGNOSIS — N183 Chronic kidney disease, stage 3 unspecified: Secondary | ICD-10-CM | POA: Insufficient documentation

## 2020-03-02 DIAGNOSIS — R809 Proteinuria, unspecified: Secondary | ICD-10-CM | POA: Diagnosis not present

## 2020-03-02 DIAGNOSIS — R1084 Generalized abdominal pain: Secondary | ICD-10-CM | POA: Insufficient documentation

## 2020-03-02 DIAGNOSIS — I5032 Chronic diastolic (congestive) heart failure: Secondary | ICD-10-CM | POA: Diagnosis not present

## 2020-03-02 LAB — URINALYSIS, ROUTINE W REFLEX MICROSCOPIC
Bacteria, UA: NONE SEEN
Bilirubin Urine: NEGATIVE
Glucose, UA: NEGATIVE mg/dL
Hgb urine dipstick: NEGATIVE
Ketones, ur: NEGATIVE mg/dL
Nitrite: NEGATIVE
Protein, ur: 100 mg/dL — AB
Specific Gravity, Urine: 1.012 (ref 1.005–1.030)
pH: 5 (ref 5.0–8.0)

## 2020-03-02 LAB — CBC WITH DIFFERENTIAL/PLATELET
Abs Immature Granulocytes: 0.05 10*3/uL (ref 0.00–0.07)
Basophils Absolute: 0.1 10*3/uL (ref 0.0–0.1)
Basophils Relative: 1 %
Eosinophils Absolute: 0.1 10*3/uL (ref 0.0–0.5)
Eosinophils Relative: 1 %
HCT: 34.9 % — ABNORMAL LOW (ref 36.0–46.0)
Hemoglobin: 11.6 g/dL — ABNORMAL LOW (ref 12.0–15.0)
Immature Granulocytes: 1 %
Lymphocytes Relative: 21 %
Lymphs Abs: 1.5 10*3/uL (ref 0.7–4.0)
MCH: 32.3 pg (ref 26.0–34.0)
MCHC: 33.2 g/dL (ref 30.0–36.0)
MCV: 97.2 fL (ref 80.0–100.0)
Monocytes Absolute: 0.4 10*3/uL (ref 0.1–1.0)
Monocytes Relative: 5 %
Neutro Abs: 4.9 10*3/uL (ref 1.7–7.7)
Neutrophils Relative %: 71 %
Platelets: 160 10*3/uL (ref 150–400)
RBC: 3.59 MIL/uL — ABNORMAL LOW (ref 3.87–5.11)
RDW: 14.3 % (ref 11.5–15.5)
WBC: 7 10*3/uL (ref 4.0–10.5)
nRBC: 0 % (ref 0.0–0.2)

## 2020-03-02 LAB — LIPASE, BLOOD: Lipase: 41 U/L (ref 11–51)

## 2020-03-02 LAB — COMPREHENSIVE METABOLIC PANEL
ALT: 16 U/L (ref 0–44)
AST: 17 U/L (ref 15–41)
Albumin: 4.1 g/dL (ref 3.5–5.0)
Alkaline Phosphatase: 84 U/L (ref 38–126)
Anion gap: 12 (ref 5–15)
BUN: 24 mg/dL — ABNORMAL HIGH (ref 8–23)
CO2: 25 mmol/L (ref 22–32)
Calcium: 9.4 mg/dL (ref 8.9–10.3)
Chloride: 103 mmol/L (ref 98–111)
Creatinine, Ser: 1.37 mg/dL — ABNORMAL HIGH (ref 0.44–1.00)
GFR calc Af Amer: 46 mL/min — ABNORMAL LOW (ref >60)
GFR calc non Af Amer: 40 mL/min — ABNORMAL LOW (ref >60)
Glucose, Bld: 204 mg/dL — ABNORMAL HIGH (ref 70–99)
Potassium: 3.3 mmol/L — ABNORMAL LOW (ref 3.5–5.1)
Sodium: 140 mmol/L (ref 135–145)
Total Bilirubin: 0.7 mg/dL (ref 0.3–1.2)
Total Protein: 7.2 g/dL (ref 6.5–8.1)

## 2020-03-02 LAB — C DIFFICILE QUICK SCREEN W PCR REFLEX
C Diff antigen: NEGATIVE
C Diff interpretation: NOT DETECTED
C Diff toxin: NEGATIVE

## 2020-03-02 LAB — POC OCCULT BLOOD, ED: Fecal Occult Bld: NEGATIVE

## 2020-03-02 LAB — CBG MONITORING, ED: Glucose-Capillary: 182 mg/dL — ABNORMAL HIGH (ref 70–99)

## 2020-03-02 MED ORDER — ONDANSETRON HCL 4 MG/2ML IJ SOLN
4.0000 mg | Freq: Once | INTRAMUSCULAR | Status: AC
  Administered 2020-03-02: 4 mg via INTRAVENOUS
  Filled 2020-03-02: qty 2

## 2020-03-02 MED ORDER — IOHEXOL 350 MG/ML SOLN: 75.0000 mL | Freq: Once | INTRAVENOUS | Status: DC | PRN

## 2020-03-02 MED ORDER — FENTANYL CITRATE (PF) 100 MCG/2ML IJ SOLN
50.0000 ug | Freq: Once | INTRAMUSCULAR | Status: AC
  Administered 2020-03-02: 50 ug via INTRAVENOUS
  Filled 2020-03-02: qty 2

## 2020-03-02 MED ORDER — DICYCLOMINE HCL 20 MG PO TABS
20.0000 mg | ORAL_TABLET | Freq: Two times a day (BID) | ORAL | 0 refills | Status: DC
Start: 2020-03-02 — End: 2020-03-24

## 2020-03-02 MED ORDER — ONDANSETRON 4 MG PO TBDP: 4.0000 mg | ORAL_TABLET | Freq: Three times a day (TID) | ORAL | 0 refills | Status: DC | PRN

## 2020-03-02 MED ORDER — CLONIDINE HCL 0.1 MG PO TABS
0.1000 mg | ORAL_TABLET | Freq: Once | ORAL | Status: AC
  Administered 2020-03-02: 0.1 mg via ORAL
  Filled 2020-03-02: qty 1

## 2020-03-02 MED ORDER — IOHEXOL 300 MG/ML  SOLN
100.0000 mL | Freq: Once | INTRAMUSCULAR | Status: AC | PRN
  Administered 2020-03-02: 16:00:00 75 mL via INTRAVENOUS

## 2020-03-02 MED ORDER — SODIUM CHLORIDE 0.9 % IV BOLUS
1000.0000 mL | Freq: Once | INTRAVENOUS | Status: AC
  Administered 2020-03-02: 1000 mL via INTRAVENOUS

## 2020-03-02 NOTE — ED Notes (Signed)
Pt. With ct.  

## 2020-03-02 NOTE — ED Triage Notes (Signed)
Had test resulted that was positive for blood, kidney doctor sent here for abnormal labs.  +emesis and diarrhea, hx of IBS.

## 2020-03-02 NOTE — Discharge Instructions (Signed)
Take the patient's as prescribed.  You are not able to get a stool sample here in the emergency department.  I would suggest you follow-up and provide a sample to your PCP who can rule out a C. difficile colitis.  We do not see any evidence of this in your CT scan however did see some diarrhea.  CT scan of your head was also negative for acute findings.  You are to continue taking your home blood pressure medicine as we had to give an additional medicine here in the emergency department.  Follow with your primary care provider for reassessment of your blood pressure.  Return if you have any new worsening symptoms.

## 2020-03-02 NOTE — ED Provider Notes (Signed)
[facility] EMERGENCY DEPARTMENT Provider Note   CSN: [id number] Arrival date & time: 03/02/20  1258    History Chief Complaint  Patient presents with  . GI Bleeding    Heather [name] is a 69 y.o. female with past medical history significant for CKD, anxiety, anemia, depression, diabetes, hypertension, IBS who presents for evaluation of abnormal labs.  Over the last week has had multiple episodes of NBNB emesis, persistent nausea as well as diarrhea.  She denies any melena or blood per rectum.  Was seen by PCP 1 week ago for her physical and was noted to have heme positive stool. Denies BRBPR or melena.  She is followed by Dr. Carlean Purl with [facility].  Has had diffuse lower abdominal pain which she describes as cramping in nature.  Prior history of hysterectomy.  Does have history of diverticulitis.  No recent antibiotic use or travel.  Seen by nephrologist today who told her she had abnormal labs need to present to the emergency department for further evaluation.  Not been able to tolerate solid food in days however has been able to take small sips of liquids.  She denies any alcohol use or NSAID use.  Has history of bleeding ulcers however recent endoscopy negative for ulcerations .  Also with headache.  She denies any vision changes, lightheadedness, dizziness, difficulty with word finding, unilateral weakness or paresthesias.  She has been unable to take her home blood pressure medicines.  Denies additional aggravating or alleviating factors.  She rates her current pain a 9/10 to her lower abdomen.  Also admits to increased urination however no dysuria. States her CBG at home have been "high."  Up to 10 episodes of diarrhea daily  History obtained from patient and past medical records. No interpretor was used.  HPI     Past Medical History:  Diagnosis Date  . Acute renal failure superimposed on stage 3 chronic kidney disease ([place]) 01/21/2016  . Anemia   . Anxiety   . B12 deficiency    . Cellulitis and abscess    Abdomen and buttocks  . CKD (chronic kidney disease) stage 3, GFR 30-59 ml/min   . Closed fracture of distal end of femur, unspecified fracture morphology, initial encounter ([place])   . Depression   . Diabetic neuropathy (HCC)    feet  . DJD (degenerative joint disease)    Right forminal stenosis C4-5  . Essential hypertension   . Femur fracture, right ([place]) 03/30/2017  . Folliculitis   . GERD (gastroesophageal reflux disease)   . Gout   . Headache(784.0)   . Hiatal hernia   . History of blood transfusion   . History of bronchitis   . History of cardiac catheterization    No significant CAD 2012  . History of kidney stones    surgery to remove  . Insomnia   . Irritable bowel syndrome   . Mixed hyperlipidemia   . Osteoarthritis   . Palpitations   . Reflux esophagitis   . Tinnitus    Right  . Type 2 diabetes mellitus [facility])     Patient Active Problem List   Diagnosis Date Noted  . Left hip pain 09/22/2019  . Lumbar radiculopathy 09/22/2019  . Postoperative anemia due to acute blood loss 09/18/2019  . Postoperative urinary retention 09/18/2019  . Closed fracture of distal end of radius 05/26/2019  . Closed right hip fracture ([place]) 04/08/2018  . Salmonella   . Elevated lipase   . Gastroenteritis   .  Protein-calorie malnutrition, severe 03/05/2018  . Neck pain 03/12/2017  . Hyperkalemia 01/06/2016  . Neuropathy 03/02/2015  . Pain in the chest   . Chest pain 12/22/2014  . Essential hypertension 12/22/2014  . Diabetes mellitus with chronic kidney disease ([place]) 12/22/2014  . Hiatal hernia 12/22/2014  . GERD (gastroesophageal reflux disease) 12/22/2014  . Gout 12/22/2014  . Mixed hyperlipidemia 12/22/2014  . Depression 12/22/2014  . Type 2 diabetes mellitus without complications ([place]) [id number]/8182  . Fusion of spine, cervicothoracic region 11/01/2014  . Pseudophakia, left eye 10/20/2014  . Chronic kidney disease, stage III (moderate)  08/25/2014  . Cervical stenosis of spine 08/26/2013  . Epigastric pain 05/13/2012  . Vitamin B12 deficiency 07/08/2011  . Personal history of failed moderate sedation 04/16/2011  . Chronic Nausea and Vomiting - ? gastroapresis 04/16/2011  . Early satiety 04/16/2011  . Irritable bowel syndrome 04/16/2011  . Benign paroxysmal positional vertigo 11/20/2010  . TRICUSPID REGURGITATION, MODERATE 11/20/2010  . PULMONARY HYPERTENSION, MILD 11/20/2010  . LEG EDEMA, BILATERAL 11/20/2010  . DYSPNEA 11/20/2010  . NONSPECIFIC ABNORMAL ELECTROCARDIOGRAM 11/20/2010  . CHEST WALL PAIN, HX OF 11/20/2010    Past Surgical History:  Procedure Laterality Date  . BACK SURGERY    . c-spine surgery     03/2007  . CARDIAC CATHETERIZATION    . CHOLECYSTECTOMY    . COLONOSCOPY  04/21/2011; 05/29/11   6/12: [place] - ?AVM at IC valve, inflammatory changes at [facility] valve [facility]); 7/12 - [place]; IC valve erosions, look chronic and probably Crohn's per path  . colonoscopy  2017   [place]: random biopsies normal. TI normal  . ESOPHAGOGASTRODUODENOSCOPY  05/29/11   normal  . ESOPHAGOGASTRODUODENOSCOPY N/A 01/21/2016   Dr. Michail Sermon: normal  . EYE SURGERY Bilateral    cataracts removed  . FEMUR IM NAIL Right 04/10/2018   Procedure: INTRAMEDULLARY (IM) NAIL FEMORAL;  Surgeon: Paralee Cancel, MD;  Location: [facility];  Service: Orthopedics;  Laterality: Right;  . HARDWARE REMOVAL Right 04/10/2018   Procedure: HARDWARE REMOVAL RIGHT DISTAL FEMUR;  Surgeon: Paralee Cancel, MD;  Location: [facility];  Service: Orthopedics;  Laterality: Right;  . IR FLUORO GUIDE CV LINE RIGHT  04/01/2017  . IR US GUIDE VASC ACCESS RIGHT  04/01/2017  . JOINT REPLACEMENT Right    hip  . OPEN REDUCTION INTERNAL FIXATION (ORIF) DISTAL RADIAL FRACTURE Right 05/28/2019   Procedure: OPEN REDUCTION INTERNAL FIXATION (ORIF) DISTAL RADIAL FRACTURE;  Surgeon: Verner Mould, MD;  Location: [place];  Service: Orthopedics;  Laterality: Right;   with block 90 minutes  . ORIF FEMUR FRACTURE Right 03/31/2017   Procedure: OPEN REDUCTION INTERNAL FIXATION (ORIF) DISTAL FEMUR FRACTURE;  Surgeon: Paralee Cancel, MD;  Location: [place];  Service: Orthopedics;  Laterality: Right;  . removal of kidney stone    . Right knee arthroscopic surgery    . TONSILLECTOMY    . TOTAL ABDOMINAL HYSTERECTOMY       OB History   No obstetric history on file.     Family History  Problem Relation Age of Onset  . Diabetes Mother   . Colon cancer Brother        POSSIBLY. Patient states diagnosed at age 34, then later states age 58. unclear and limited historian  . Esophageal cancer Neg Hx   . Rectal cancer Neg Hx   . Stomach cancer Neg Hx     Social History   Tobacco Use  . Smoking status: Never Smoker  . Smokeless tobacco: Never Used  Substance Use Topics  . Alcohol use: No  . Drug use: No    Home Medications Prior to Admission medications   Medication Sig Start Date End Date Taking? Authorizing Provider  allopurinol (ZYLOPRIM) 100 MG tablet Take 100 mg by mouth daily.   Yes [provider]  Ascorbic Acid (VITAMIN C WITH ROSE HIPS) 500 MG tablet Take 500 mg by mouth every morning.   Yes [provider]  aspirin EC 81 MG tablet Take 81 mg by mouth every morning.    Yes [provider]  atorvastatin (LIPITOR) 80 MG tablet Take 80 mg by mouth every morning.   Yes [provider]  Cholecalciferol (VITAMIN D) 2000 units CAPS Take 2,000 Units by mouth every morning.    Yes [provider]  citalopram (CELEXA) 40 MG tablet Take 40 mg by mouth daily.   Yes [provider]  cyanocobalamin (,VITAMIN B-12,) 1000 MCG/ML injection Inject 1,000 mcg into the muscle every 30 (thirty) days.     Yes [provider]  diazepam (VALIUM) 5 MG tablet Take 1 tablet (5 mg total) by mouth 2 (two) times daily as needed for anxiety. 04/14/18  Yes Mariel Aloe, MD  esomeprazole (NEXIUM) 40 MG capsule Take 40  mg by mouth daily before breakfast.     Yes [provider]  furosemide (LASIX) 40 MG tablet Take 1 tablet (40 mg total) by mouth daily. Patient taking differently: Take 40 mg by mouth every morning.  01/26/16  Yes Reyne Dumas, MD  hydrALAZINE (APRESOLINE) 25 MG tablet Take 25 mg every 8 hours as needed for systolic blood pressure over 150. 01/19/20  Yes Branch, Alphonse Guild, MD  Insulin Detemir (LEVEMIR FLEXTOUCH) 100 UNIT/ML Pen Inject 80 Units into the skin at bedtime. Patient taking differently: Inject 20 Units into the skin at bedtime. Patient adjust for her sugars 04/03/17  Yes Rosita Fire, MD  Insulin Lispro (HUMALOG KWIKPEN [place]) Inject 1-21 Units into the skin 3 (three) times daily with meals. Per home sliding scale   Yes [provider]  metoprolol (LOPRESSOR) 100 MG tablet Take 100 mg by mouth 2 (two) times daily.    Yes [provider]  ondansetron (ZOFRAN) 4 MG tablet Take 1 tablet (4 mg total) by mouth every 8 (eight) hours as needed for nausea or vomiting. 06/10/18  Yes Gatha Mayer, MD  potassium chloride SA (K-DUR,KLOR-CON) 20 MEQ tablet Take 20 mEq by mouth every morning.    Yes [provider]  promethazine (PHENERGAN) 25 MG tablet Take 1 tablet (25 mg total) by mouth every 6 (six) hours as needed for nausea or vomiting. 02/03/20  Yes Lemmon, Lavone Nian, PA  Semaglutide,0.25 or 0.5MG /DOS, (OZEMPIC, 0.25 OR 0.5 MG/DOSE,) 2 MG/1.5ML SOPN Inject 0.5 mg into the skin once a week.   Yes [provider]  sucralfate (CARAFATE) 1 g tablet Take 1 tablet (1 g total) by mouth every 6 (six) hours as needed. 02/05/20  Yes Armbruster, Carlota Raspberry, MD  tiZANidine (ZANAFLEX) 4 MG capsule Take 2 capsules by mouth at bedtime.   Yes [provider]  dicyclomine (BENTYL) 20 MG tablet Take 1 tablet (20 mg total) by mouth 2 (two) times daily. 03/02/20   Terilyn Sano A, PA-C  FLUoxetine (PROZAC) 20 MG capsule Take 20 mg by mouth at bedtime.  02/17/20   [provider]  hydrOXYzine (ATARAX/VISTARIL) 25 MG tablet Take 25 mg by mouth 2 (two) times daily. 01/25/20   [provider]  indomethacin (INDOCIN) 50 MG capsule Take 50 mg by mouth 3 (three) times daily as needed.    [provider]  Melatonin 10 MG TABS Take 10 mg by mouth at bedtime as needed.     [provider]  ondansetron (ZOFRAN ODT) 4 MG disintegrating tablet Take 1 tablet (4 mg total) by mouth every 8 (eight) hours as needed for nausea or vomiting. 03/02/20   Lottie Sigman A, PA-C  oxyCODONE (ROXICODONE) 5 MG immediate release tablet Take 1 tablet (5 mg total) by mouth every 6 (six) hours as needed for up to 10 doses for severe pain. Patient not taking: Reported on 03/02/2020 02/03/20   Levin Erp, PA  traZODone (DESYREL) 150 MG tablet Take 150 mg by mouth at bedtime. 02/17/20   [provider]    Allergies    Duloxetine hcl, Fluocinolone, Acetaminophen, Amlodipine besylate, Ciprofloxacin, Hydrocodone-acetaminophen, Metformin, Nsaids, Quinolones, Sulfamethoxazole, Sulfonamide derivatives, Valsartan, Cozaar [losartan], Lisinopril, and Tape  Review of Systems   Review of Systems  Constitutional: Negative.   HENT: Negative.   Eyes: Negative.   Respiratory: Negative.   Cardiovascular: Negative.   Gastrointestinal: Positive for abdominal pain, diarrhea, nausea and vomiting.  Endocrine: Positive for polyuria. Negative for polydipsia.  Genitourinary: Positive for frequency. Negative for decreased urine volume, difficulty urinating, dysuria, flank pain, hematuria, menstrual problem, pelvic pain, urgency, vaginal bleeding, vaginal discharge and vaginal pain.  Musculoskeletal: Negative.   Skin: Negative.   Neurological: Negative.   All other systems reviewed and are negative.   Physical Exam Updated Vital Signs BP (!) 194/71 (BP Location: Right Leg)   Pulse 78   Temp 98.3 F (36.8 C) (Oral)   Resp 12   Ht 5\' 3"  (1.6  m)   Wt 59.9 kg   LMP  (LMP Unknown)   SpO2 99%   BMI 23.38 kg/m   Physical Exam Vitals and nursing note reviewed. Exam conducted with a chaperone present.  Constitutional:      General: She is not in acute distress.    Appearance: She is well-developed. She is not ill-appearing, toxic-appearing or diaphoretic.  HENT:     Head: Normocephalic and atraumatic.     Nose: Nose normal.     Mouth/Throat:     Mouth: Mucous membranes are dry.     Pharynx: Oropharynx is clear.  Eyes:     Pupils: Pupils are equal, round, and reactive to light.  Cardiovascular:     Rate and Rhythm: Normal rate.     Pulses: Normal pulses.     Heart sounds: Normal heart sounds.  Pulmonary:     Effort: Pulmonary effort is normal. No respiratory distress.     Breath sounds: Normal breath sounds.  Abdominal:     General: Bowel sounds are normal. There is no distension.     Palpations: Abdomen is soft.     Tenderness: There is generalized abdominal tenderness and tenderness in the suprapubic area and left lower quadrant. There is guarding. There is no right CVA tenderness, left CVA tenderness or rebound. Negative signs include Murphy's sign and McBurney's sign.     Hernia: No hernia is present.     Comments: Generalized tenderness palpation however worse to lower abdomen.  No rebound however there is mild guarding.  Genitourinary:    Comments: Light brown stool in vault without anal fissures, tenderness. Musculoskeletal:        General: Normal range of motion.     Cervical back: Normal range of motion.  Comments: Moves all 4 extremities without difficulty.  Skin:    General: Skin is warm and dry.     Capillary Refill: Capillary refill takes less than 2 seconds.     Comments: Brisk capillary refill  Neurological:     General: No focal deficit present.     Mental Status: She is alert.     Cranial Nerves: Cranial nerves are intact.     Sensory: Sensation is intact.     Motor: Motor function is intact.      Coordination: Coordination is intact.     Comments: Cranial nerves II 12 grossly intact.  Negative Romberg, heel-to-shin, finger-nose.    ED Results / Procedures / Treatments   Labs (all labs ordered are listed, but only abnormal results are displayed) Labs Reviewed  CBC WITH DIFFERENTIAL/PLATELET - Abnormal; Notable for the following components:      Result Value   RBC 3.59 (*)    Hemoglobin 11.6 (*)    HCT 34.9 (*)    All other components within normal limits  COMPREHENSIVE METABOLIC PANEL - Abnormal; Notable for the following components:   Potassium 3.3 (*)    Glucose, Bld 204 (*)    BUN 24 (*)    Creatinine, Ser 1.37 (*)    GFR calc non Af Amer 40 (*)    GFR calc Af Amer 46 (*)    All other components within normal limits  URINALYSIS, ROUTINE W REFLEX MICROSCOPIC - Abnormal; Notable for the following components:   Protein, ur 100 (*)    Leukocytes,Ua TRACE (*)    All other components within normal limits  CBG MONITORING, ED - Abnormal; Notable for the following components:   Glucose-Capillary 182 (*)    All other components within normal limits  C DIFFICILE QUICK SCREEN W PCR REFLEX  GASTROINTESTINAL PANEL BY PCR, STOOL (REPLACES STOOL CULTURE)  LIPASE, BLOOD  POC OCCULT BLOOD, ED   EKG EKG Interpretation  Date/Time:  Wednesday Mar 02 2020 13:47:03 EDT Ventricular Rate:  83 PR Interval:    QRS Duration: 91 QT Interval:  403 QTC Calculation: 474 R Axis:   92 Text Interpretation: Sinus rhythm Probable lateral infarct, age indeterminate Confirmed by Davonna Belling [phone]) on 03/02/2020 2:58:55 PM   Radiology CT Head Wo Contrast  Result Date: 03/02/2020 CLINICAL DATA:  Headache EXAM: CT HEAD WITHOUT CONTRAST TECHNIQUE: Contiguous axial images were obtained from the base of the skull through the vertex without intravenous contrast. COMPARISON:  10/27/2019 FINDINGS: Brain: No evidence of acute infarction, hemorrhage, hydrocephalus, extra-axial collection or mass  lesion/mass effect. Vascular: No hyperdense vessel or unexpected calcification. Skull: Normal. Negative for fracture or focal lesion. Sinuses/Orbits: No acute finding. Other: None. IMPRESSION: No acute intracranial pathology. No non-contrast CT findings to explain headache. Electronically Signed   By: Eddie Candle M.D.   On: 03/02/2020 16:01   CT ABDOMEN PELVIS W CONTRAST  Result Date: 03/02/2020 CLINICAL DATA:  Nausea, vomiting history of IBS EXAM: CT ABDOMEN AND PELVIS WITH CONTRAST TECHNIQUE: Multidetector CT imaging of the abdomen and pelvis was performed using the standard protocol following bolus administration of intravenous contrast. CONTRAST:  33mL OMNIPAQUE IOHEXOL 300 MG/ML  SOLN COMPARISON:  02/05/2020 FINDINGS: Lower chest: No acute abnormality.  Coronary artery calcifications. Hepatobiliary: No focal liver abnormality is seen. Status post cholecystectomy. No biliary dilatation. Pancreas: Unremarkable. No pancreatic ductal dilatation or surrounding inflammatory changes. Spleen: Normal in size without significant abnormality. Adrenals/Urinary Tract: Adrenal glands are unremarkable. Kidneys are normal, without renal calculi, solid  lesion, or hydronephrosis. Bladder is unremarkable. Stomach/Bowel: Stomach is within normal limits. Appendix is nonvisualized and may be surgically absent. No evidence of bowel wall thickening, distention, or inflammatory changes. The small bowel and proximal colon are fluid-filled, with stool in the distal colon. Sigmoid diverticulosis. Vascular/Lymphatic: Aortic atherosclerosis. No enlarged abdominal or pelvic lymph nodes. Reproductive: Status post hysterectomy. Other: No abdominal wall hernia or abnormality. No abdominopelvic ascites. Musculoskeletal: No acute or significant osseous findings. IMPRESSION: 1. No acute CT findings of the abdomen or pelvis to explain pain, nausea, or vomiting. 2. The small bowel and proximal colon are fluid-filled, with stool in distal colon,  generally in keeping with diarrheal illness. 3.  Diverticulosis without evidence of acute diverticulitis. 4.  Coronary artery disease.  Aortic Atherosclerosis (ICD10-I70.0). Electronically Signed   By: Eddie Candle M.D.   On: 03/02/2020 16:12    Procedures Procedures (including critical care time)  Medications Ordered in ED Medications  iohexol (OMNIPAQUE) 350 MG/ML injection 75 mL (75 mLs Intravenous Canceled Entry 03/02/20 1544)  sodium chloride 0.9 % bolus 1,000 mL (0 mLs Intravenous Stopped 03/02/20 1817)  ondansetron (ZOFRAN) injection 4 mg (4 mg Intravenous Given 03/02/20 1523)  fentaNYL (SUBLIMAZE) injection 50 mcg (50 mcg Intravenous Given 03/02/20 1526)  iohexol (OMNIPAQUE) 300 MG/ML solution 100 mL (75 mLs Intravenous Contrast Given 03/02/20 1548)  cloNIDine (CATAPRES) tablet 0.1 mg (0.1 mg Oral Given 03/02/20 1659)  fentaNYL (SUBLIMAZE) injection 50 mcg (50 mcg Intravenous Given 03/02/20 1720)  ondansetron (ZOFRAN) injection 4 mg (4 mg Intravenous Given 03/02/20 1714)    ED Course  I have reviewed the triage vital signs and the nursing notes.  Pertinent labs & imaging results that were available during my care of the patient were reviewed by me and considered in my medical decision making (see chart for details).  69 year old female presents for evaluation of abnormal labs in setting of diarrhea.  She is afebrile, nonseptic, not ill-appearing.  Patient with generalized abdominal pain however worse to lower quadrants.  Multiple episodes of NBNB emesis.  Seen by PCP 1 week ago and had heme positive stool.  Patient denies any melanotic or bright red blood per rectum.  Also admits to headache which she relates to her hypertension.  She has a nonfocal neuro exam without deficits.  Denies any sudden onset thunderclap headache.  She appears overall well however does appear mildly dehydrated.  Has had some polyuria however denies any dysuria.  Heart and lungs clear.  Plan on labs, imaging and  reassess.  Labs and imaging personally reviewed and interpreted: EKG without STEMI CMP mild hyperglycemia to 204, hypokalemia at 3.3, creatinine 1.39 up from 1.14 CBC without leukocytosis, Hgb 11.6, previous 10.9 UA negative for infection, Positive for proteinuria however no ketonuria C-diff negative Occult negative, light brown stool on exam Lipase 41  Patient reassessed. Feels improved after IVF and antiemetics.  Pending CT imaging. Patient told nursing that she wants to go home after the imaging as she feels improved.  Patient reassessed. Discussed CT and labs findings today. Feels improved after IVF and antiemetics. CHF compensated and not in fluid overload. Would like to discharge home and follow up outpatient. Given she appear well, negative occult will dc home and have her follow up with GI or her PCP. Already on potassium supplementation for hypokalemia.  Apparently seen by nephrology today who recommended further evaluation with admission. I discussed this with patient however given her symptoms improved would like to try outpatient management and follow up outpatient.  Will DC home with Zofran and have her follow-up outpatient.  Patient is nontoxic, nonseptic appearing, in no apparent distress.  Patient's pain and other symptoms adequately managed in emergency department.  Fluid bolus given.  Labs, imaging and vitals reviewed.  Patient does not meet the SIRS or Sepsis criteria.  On repeat exam patient does not have a surgical abdomin and there are no peritoneal signs.  No indication of appendicitis, bowel obstruction, bowel perforation, cholecystitis, diverticulitis, PID or ectopic pregnancy.    The patient has been appropriately medically screened and/or stabilized in the ED. I have low suspicion for any other emergent medical condition which would require further screening, evaluation or treatment in the ED or require inpatient management.  Patient is hemodynamically stable and in no  acute distress.  Patient able to ambulate in department prior to ED.  Evaluation does not show acute pathology that would require ongoing or additional emergent interventions while in the emergency department or further inpatient treatment.  I have discussed the diagnosis with the patient and answered all questions.  Pain is been managed while in the emergency department and patient has no further complaints prior to discharge.  Patient is comfortable with plan discussed in room and is stable for discharge at this time.  I have discussed strict return precautions for returning to the emergency department.  Patient was encouraged to follow-up with PCP/specialist refer to at discharge.    MDM Rules/Calculators/A&P                       Patient with mild AKI from baseline, N/V/D. Requesting to dc home after workup. Seem reasonable as she is tolerating PO intake and her ABD pain is resolved here in ED with negative CT findings. Occult negative. Will have her follow up with GI. Hcg at baseline. Encouraged increased PO intake at home. Final Clinical Impression(s) / ED Diagnoses Final diagnoses:  Generalized abdominal pain  Nausea vomiting and diarrhea  Essential hypertension    Rx / DC Orders ED Discharge Orders         Ordered    dicyclomine (BENTYL) 20 MG tablet  2 times daily     03/02/20 1755    ondansetron (ZOFRAN ODT) 4 MG disintegrating tablet  Every 8 hours PRN     03/02/20 1755           Moishe Schellenberg A, PA-C 03/02/20 2015    Davonna Belling, MD 03/03/20 [phone]

## 2020-03-03 LAB — GASTROINTESTINAL PANEL BY PCR, STOOL (REPLACES STOOL CULTURE)

## 2020-03-10 ENCOUNTER — Ambulatory Visit (INDEPENDENT_AMBULATORY_CARE_PROVIDER_SITE_OTHER): Payer: Medicare Other | Admitting: Nurse Practitioner

## 2020-03-10 ENCOUNTER — Other Ambulatory Visit (INDEPENDENT_AMBULATORY_CARE_PROVIDER_SITE_OTHER): Payer: Medicare Other

## 2020-03-10 ENCOUNTER — Encounter: Payer: Self-pay | Admitting: Nurse Practitioner

## 2020-03-10 VITALS — BP 160/80 | HR 84 | Temp 98.5°F | Ht 62.0 in | Wt 133.1 lb

## 2020-03-10 DIAGNOSIS — K529 Noninfective gastroenteritis and colitis, unspecified: Secondary | ICD-10-CM

## 2020-03-10 DIAGNOSIS — R197 Diarrhea, unspecified: Secondary | ICD-10-CM | POA: Diagnosis not present

## 2020-03-10 LAB — BASIC METABOLIC PANEL
BUN: 28 mg/dL — ABNORMAL HIGH (ref 6–23)
CO2: 27 mEq/L (ref 19–32)
Calcium: 8.7 mg/dL (ref 8.4–10.5)
Chloride: 101 mEq/L (ref 96–112)
Creatinine, Ser: 1.64 mg/dL — ABNORMAL HIGH (ref 0.40–1.20)
GFR: 31.19 mL/min — ABNORMAL LOW (ref >60.00)
Glucose, Bld: 121 mg/dL — ABNORMAL HIGH (ref 70–99)
Potassium: 3.2 mEq/L — ABNORMAL LOW (ref 3.5–5.1)
Sodium: 138 mEq/L (ref 135–145)

## 2020-03-10 LAB — C-REACTIVE PROTEIN: CRP: <1 mg/dL (ref 0.5–20.0)

## 2020-03-10 LAB — TSH: TSH: 2.19 u[IU]/mL (ref 0.35–4.50)

## 2020-03-10 LAB — IGA: IgA: 370 mg/dL (ref 68–378)

## 2020-03-10 NOTE — Patient Instructions (Signed)
If you are age 68 or older, your body mass index should be between 23-30. Your Body mass index is 24.35 kg/m. If this is out of the aforementioned range listed, please consider follow up with your Primary Care Provider.  If you are age 81 or younger, your body mass index should be between 19-25. Your Body mass index is 24.35 kg/m. If this is out of the aformentioned range listed, please consider follow up with your Primary Care Provider.   Your provider has requested that you go to the basement level for lab work before leaving today. Press "B" on the elevator. The lab is located at the first door on the left as you exit the elevator.  Please purchase the following medications over the counter and take as directed:  Florastor probiotic 1 tablet twice daily  Immodium 1 tablet twice daily as needed, stop if no Bowel movement in 24 hours Stop all dairy products  Due to recent changes in healthcare laws, you may see the results of your imaging and laboratory studies on MyChart before your provider has had a chance to review them.  We understand that in some cases there may be results that are confusing or concerning to you. Not all laboratory results come back in the same time frame and the provider may be waiting for multiple results in order to interpret others.  Please give [place] 48 hours in order for your provider to thoroughly review all the results before contacting the office for clarification of your results.   Thank you for choosing [facility] Noralyn Pick, CRNP

## 2020-03-10 NOTE — Progress Notes (Addendum)
03/10/2020 Heather [name] [id number] August 04, 1951   Chief Complaint: abdominal pain and diarrhea   History of Present Illness: is a 69 y.o. female with past medical history significant for CKD, anxiety, anemia, depression, diabetes, hypertension, CKD, prepyloric ulcer 2012 and diarrhea predominant IBS.  Past cholecystectomy.  He presents today with complaints of having lower abdominal pain which started 1 week ago.  She has chronic diarrhea which she reports has been on and off for the past few years.  She is passing anywhere from 5-15 nonbloody diarrhea bowel movements daily.  She is able to pass a solid stool 2 days monthly.  No rectal bleeding or melena.  Her stool color is sometimes yellow or green. She has fecal incontinence 3 times monthly.  She has nausea for the past 2 weeks.  She vomited partially undigested food 2 weeks ago.  Since then she has vomited water-like secretions on a few occasions.  No hematemesis. The smell of food causes nausea.  No specific food triggers.  She drinks milk and eats sliced cheese most days.  She occasionally eats ice cream.  If she drinks water this will trigger diarrhea at times.  She was recently seen by her nephrologist and laboratory studies were completed.  She received a call from her nephrologist stating her labs were abnormal and recommended for the patient to present to the ED for further evaluation.  He presented to [facility] ED on 03/02/2020.  In the ED, she complained of lower abdominal pain and diarrhea.  Labs in the ED showed hemoglobin 11.6.  Potassium 3.3.  BUN 24.  Creatinine 1.37.  Urine protein 100 and leukocyte trace.  An abdominal/pelvic CT showed the small bowel and proximal colon were fluid-filled with stool in the distal colon and diverticulosis without evidence of acute diverticulitis.  C. difficile was negative.  GI pathogen panel negative.  She underwent an EGD 02/08/2020 which was normal.  Most recent colonoscopy was 08/29/2016 was  normal, no evidence of colitis.  History of ulceration at the IC valve in 2012 with possible small bowel Crohn's disease.  See further procedure results below. She has lost 5 lbs over the past year. She is taking ASA 81mg  daily. Indocin is on her medication list, she stated she is not taking. No other NSAID use.   CBC Latest Ref Rng & Units 03/02/2020 02/03/2020 05/28/2019  WBC 4.0 - 10.5 K/uL 7.0 6.5 8.4  Hemoglobin 12.0 - 15.0 g/dL 11.6(L) 10.9(L) 13.9  Hematocrit 36.0 - 46.0 % 34.9(L) 32.6(L) 46.6(H)  Platelets 150 - 400 K/uL 160 218.0 222    5/5 C. Diff:  negative. GI pathogen panel: negative.  FOBT: negative.  Abdominal/pelvic CT with contrast 03/02/2020: 1. No acute CT findings of the abdomen or pelvis to explain pain, nausea, or vomiting. 2. The small bowel and proximal colon are fluid-filled, with stool in distal colon, generally in keeping with diarrheal illness. 3.  Diverticulosis without evidence of acute diverticulitis. 4.. Coronary artery disease.  Aortic Atherosclerosis.  EGD 02/08/2020:  - The esophagus was normal. - The stomach was normal. - The examined duodenum was normal. - The cardia and gastric fundus were normal on retroflexion.  Colonoscopy 08/29/2016:  - The examined portion of the ileum was normal. - The entire examined colon is normal.  Biopsy showed benign colonic mucosa, no significant inflammation or other   abnormalities identified.  EGD and placement of Small bowel endoscopy 07/19/11: -EGD was normal.  -Small bowel capsule endoscopy showed scattered  mild inflammatory changes to the proximal small bowel which could represent Crohn's disease but not diagnostic.   Colonoscopy by Dr. Carlean Purl 05/29/2011: -Erosion at the IC valve otherwise normal colonoscopy  1. Ileocecal valve, biopsy, erosive - PATCHY CHRONIC ACTIVE COLITIS WITH SURFACE ULCERATION. - NO EVIDENCE OF GRANULOMA OR DYSPLASIA - PLEASE SEE COMMENT. 2. Colon, biopsy, random - PATCHY CHRONIC  MINIMALLY ACTIVE COLITIS. - NO GRANULOMAS OR DYSPLASIA.  The overall findings are mostly consistent with inflammatory bowel disease and mostly compatible with Crohn's disease. NSAID induced colitis is also in the differential diagnosis but it is less likely. There is no evidence of dysplasia or malignancy identified in this material  Colonoscopy 04/21/2011 done due to GI bleeding by Dr. Sherrlyn Hock at [facility]: -No active bleeding noted. -There was no diverticulosis. 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  Biopsies showed markedly acutely inflamation and hemorrhagic colonic soft tissue -Tortuous and redundant colon -Tortuous rectosigmoid   Current Medications, Allergies, Past Medical History, Past Surgical History, Family History and Social History were reviewed in [employer] record.   Physical Exam: BP (!) 160/80 (BP Location: Left Arm, Patient Position: Sitting, Cuff Size: Normal)   Pulse 84   Temp 98.5 F (36.9 C)   Ht 5\' 2"  (1.575 m) Comment: height measured without shoes  Wt 133 lb 2 oz (60.4 kg)   LMP  (LMP Unknown)   BMI 24.35 kg/m  General: Well developed 69 year old female in no acute distress. Head: Normocephalic and atraumatic. Eyes: No scleral icterus. Conjunctiva pink . Ears: Normal auditory acuity. Mouth: Dentition intact. No ulcers or lesions.  Lungs: Clear throughout to auscultation. Heart: Regular rate and rhythm, no murmur. Abdomen: Soft, nondistended. Tenderness epigastric, periubilical, RLQ and LLQ No masses or hepatomegaly. Normal bowel sounds x 4 quadrants.  Rectal: Deferred.  Musculoskeletal: Symmetrical with no gross deformities. Extremities: No edema. Neurological: Alert oriented x 4. No focal deficits.  Psychological: Alert and cooperative. Normal mood and affect  Assessment and Recommendations:  21. 69 year old female with lower abdominal pain, negative CTAP on 5/5. -Dicyclomine 20  mg p.o. twice daily as needed -See plan in #2 -Call our office if her abdominal pain worsens  2. Diarrhea predominant IBS. Previously failed Lotronex, antidiarrheals, Cholestyramine and Zenpep -TSH, CRP, IgA and TTG -Florastor probiotic 1 p.o. twice daily -Loperamide 1 tablet twice daily as needed, stop if no BM in 24 hours -Stop all dairy products -Diagnostic colonoscopy benefits and risks discussed including risk with sedation, risk of bleeding, perforation and infection. I will consult with Dr. Carlean Purl prior to patient proceeding with colonoscopy.  -If colonoscopy negative will consider trial with Xifaxan  3. Nausea/vomiting. Possibly due to Ozempic. Normal LFTs. Lipase 81 -> 41. CTAP 5/5 showed a normal pancreas  -Ondansetron 4mg  ODT Q 8 hrs PRN   4.  Hypokalemia most likely due to diarrhea -Repeat BMP  5. DM II, recently started on Ozempic   6. CKD. R. 1.15. GFR 46.9.  7. Chronic anemia. Hg 10.9. MCV 97.2.    Agree w/ Ms. Quenton Fetter excellent evaluation.  Agree with pursuing colonoscopy to look for IBD or microscopic colitis.  Complicated hx overall - has had inflammatory colitis in past tne not so thought IBS but I do think she could have an inflammatory process again  Gatha Mayer, MD, [facility] 03/11/2020 5:25 PM

## 2020-03-11 LAB — TISSUE TRANSGLUTAMINASE, IGA: (tTG) Ab, IgA: 1 U/mL

## 2020-03-16 ENCOUNTER — Other Ambulatory Visit (HOSPITAL_COMMUNITY)
Admission: RE | Admit: 2020-03-16 | Discharge: 2020-03-16 | Disposition: A | Discharge status: Home / Self Care | Payer: Medicare Other | Source: Ambulatory Visit | Attending: Internal Medicine | Admitting: Internal Medicine

## 2020-03-16 ENCOUNTER — Other Ambulatory Visit: Payer: Self-pay

## 2020-03-16 DIAGNOSIS — Z20822 Contact with and (suspected) exposure to covid-19: Secondary | ICD-10-CM | POA: Insufficient documentation

## 2020-03-16 DIAGNOSIS — Z01812 Encounter for preprocedural laboratory examination: Secondary | ICD-10-CM | POA: Insufficient documentation

## 2020-03-17 LAB — SARS CORONAVIRUS 2 (TAT 6-24 HRS): SARS Coronavirus 2: NEGATIVE

## 2020-03-18 ENCOUNTER — Other Ambulatory Visit: Payer: Self-pay

## 2020-03-18 ENCOUNTER — Encounter: Payer: Self-pay | Admitting: Internal Medicine

## 2020-03-18 ENCOUNTER — Ambulatory Visit (AMBULATORY_SURGERY_CENTER): Payer: Medicare Other | Admitting: Internal Medicine

## 2020-03-18 VITALS — BP 195/85 | HR 73 | Temp 97.5°F | Resp 13 | Ht 62.0 in | Wt 133.0 lb

## 2020-03-18 DIAGNOSIS — R197 Diarrhea, unspecified: Secondary | ICD-10-CM | POA: Diagnosis not present

## 2020-03-18 DIAGNOSIS — K529 Noninfective gastroenteritis and colitis, unspecified: Secondary | ICD-10-CM | POA: Diagnosis not present

## 2020-03-18 DIAGNOSIS — Z0389 Encounter for observation for other suspected diseases and conditions ruled out: Secondary | ICD-10-CM

## 2020-03-18 MED ORDER — SODIUM CHLORIDE 0.9 % IV SOLN
500.0000 mL | Freq: Once | INTRAVENOUS | Status: DC
Start: 2020-03-18 — End: 2020-03-18

## 2020-03-18 NOTE — Progress Notes (Signed)
Pt's states no medical or surgical changes since previsit or office visit. 

## 2020-03-18 NOTE — Patient Instructions (Addendum)
Things look ok.  I did take biopsies to see if there is any inflammation that can only be seen with a microscope.  Once the biopsies come back will let you know.  I appreciate the opportunity to care for you. Gatha Mayer, MD, FACG  YOU HAD AN ENDOSCOPIC PROCEDURE TODAY AT [facility]:   Refer to the procedure report that was given to you for any specific questions about what was found during the examination.  If the procedure report does not answer your questions, please call your gastroenterologist to clarify.  If you requested that your care partner not be given the details of your procedure findings, then the procedure report has been included in a sealed envelope for you to review at your convenience later.  YOU SHOULD EXPECT: Some feelings of bloating in the abdomen. Passage of more gas than usual.  Walking can help get rid of the air that was put into your GI tract during the procedure and reduce the bloating. If you had a lower endoscopy (such as a colonoscopy or flexible sigmoidoscopy) you may notice spotting of blood in your stool or on the toilet paper. If you underwent a bowel prep for your procedure, you may not have a normal bowel movement for a few days.  Please Note:  You might notice some irritation and congestion in your nose or some drainage.  This is from the oxygen used during your procedure.  There is no need for concern and it should clear up in a day or so.  SYMPTOMS TO REPORT IMMEDIATELY:   Following lower endoscopy (colonoscopy or flexible sigmoidoscopy):  Excessive amounts of blood in the stool  Significant tenderness or worsening of abdominal pains  Swelling of the abdomen that is new, acute  Fever of 100F or higher   For urgent or emergent issues, a gastroenterologist can be reached at any hour by calling [phone]. Do not use MyChart messaging for urgent concerns.    DIET:  We do recommend a small meal at first, but then you may  proceed to your regular diet.  Drink plenty of fluids but you should avoid alcoholic beverages for 24 hours.  ACTIVITY:  You should plan to take it easy for the rest of today and you should NOT DRIVE or use heavy machinery until tomorrow (because of the sedation medicines used during the test).    FOLLOW UP: Our staff will call the number listed on your records 48-72 hours following your procedure to check on you and address any questions or concerns that you may have regarding the information given to you following your procedure. If we do not reach you, we will leave a message.  We will attempt to reach you two times.  During this call, we will ask if you have developed any symptoms of COVID 19. If you develop any symptoms (ie: fever, flu-like symptoms, shortness of breath, cough etc.) before then, please call [phone].  If you test positive for Covid 19 in the 2 weeks post procedure, please call and report this information to [place].    If any biopsies were taken you will be contacted by phone or by letter within the next 1-3 weeks.  Please call us at [phone] if you have not heard about the biopsies in 3 weeks.    SIGNATURES/CONFIDENTIALITY: You and/or your care partner have signed paperwork which will be entered into your electronic medical record.  These signatures attest to the fact that that  the information above on your After Visit Summary has been reviewed and is understood.  Full responsibility of the confidentiality of this discharge information lies with you and/or your care-partner.

## 2020-03-18 NOTE — Op Note (Signed)
[place] Patient Name: Heather [name] Procedure Date: 03/18/2020 3:36 PM MRN: [id number] Endoscopist: Gatha Mayer , MD Age: 69 Referring MD:  Date of Birth: 10-Jun-1951 Gender: Female Account #: [id number] Procedure:                Colonoscopy Indications:              Chronic diarrhea, Clinically significant diarrhea                            of unexplained origin Medicines:                Propofol per Anesthesia, Monitored Anesthesia Care Procedure:                Pre-Anesthesia Assessment:                           - Prior to the procedure, a History and Physical                            was performed, and patient medications and                            allergies were reviewed. The patient's tolerance of                            previous anesthesia was also reviewed. The risks                            and benefits of the procedure and the sedation                            options and risks were discussed with the patient.                            All questions were answered, and informed consent                            was obtained. Prior Anticoagulants: The patient has                            taken no previous anticoagulant or antiplatelet                            agents. ASA Grade Assessment: III - A patient with                            severe systemic disease. After reviewing the risks                            and benefits, the patient was deemed in                            satisfactory condition to undergo the procedure.  After obtaining informed consent, the colonoscope                            was passed under direct vision. Throughout the                            procedure, the patient's blood pressure, pulse, and                            oxygen saturations were monitored continuously. The                            Colonoscope was introduced through the anus and                            advanced  to the the terminal ileum, with                            identification of the appendiceal orifice and IC                            valve. The colonoscopy was performed without                            difficulty. The patient tolerated the procedure                            well. The quality of the bowel preparation was                            adequate. The terminal ileum, ileocecal valve,                            appendiceal orifice, and rectum were photographed.                            The bowel preparation used was Miralax via split                            dose instruction. Scope In: 3:45:43 PM Scope Out: 4:01:48 PM Scope Withdrawal Time: 0 hours 10 minutes 58 seconds  Total Procedure Duration: 0 hours 16 minutes 5 seconds  Findings:                 The perianal and digital rectal examinations were                            normal.                           The terminal ileum appeared normal.                           The entire examined colon appeared normal on direct  and retroflexion views.                           Biopsies for histology were taken with a cold                            forceps from the ascending colon, transverse colon,                            descending colon, sigmoid colon and rectum for                            evaluation of microscopic colitis. Estimated blood                            loss was minimal. Complications:            No immediate complications. Estimated Blood Loss:     Estimated blood loss was minimal. Impression:               - The examined portion of the ileum was normal.                           - The entire examined colon is normal on direct and                            retroflexion views.                           - Biopsies were taken with a cold forceps from the                            ascending colon, transverse colon, descending                            colon, sigmoid colon and  rectum for evaluation of                            microscopic colitis. Recommendation:           - Patient has a contact number available for                            emergencies. The signs and symptoms of potential                            delayed complications were discussed with the                            patient. Return to normal activities tomorrow.                            Written discharge instructions were provided to the                            patient.                           -  Resume previous diet.                           - Continue present medications.                           - Await pathology results.                           - No recommendation at this time regarding repeat                            colonoscopy. Gatha Mayer, MD 03/18/2020 4:11:54 PM This report has been signed electronically.

## 2020-03-18 NOTE — Progress Notes (Signed)
A/ox3, pleased with MAC, report to RN 

## 2020-03-18 NOTE — Progress Notes (Signed)
Called to room to assist during endoscopic procedure.  Patient ID and intended procedure confirmed with present staff. Received instructions for my participation in the procedure from the performing physician.  

## 2020-03-22 ENCOUNTER — Telehealth: Payer: Self-pay | Admitting: *Deleted

## 2020-03-22 NOTE — Telephone Encounter (Signed)
1. Have you developed a fever since your procedure? no  2.   Have you had an respiratory symptoms (SOB or cough) since your procedure? no  3.   Have you tested positive for COVID 19 since your procedure no  4.   Have you had any family members/close contacts diagnosed with the COVID 19 since your procedure?  no   If yes to any of these questions please route to Joylene John, RN and Erenest Rasher, RN Follow up Call-  Call back number 03/18/2020 02/08/2020  Post procedure Call Back phone  # [phone]  Permission to leave phone message Yes Yes  Some recent data might be hidden     Patient questions:  Do you have a fever, pain , or abdominal swelling? No. Pain Score  0 *  Have you tolerated food without any problems? Yes.    Have you been able to return to your normal activities? Yes.    Do you have any questions about your discharge instructions: Diet   No. Medications  No. Follow up visit  No.  Do you have questions or concerns about your Care? No.  Actions: * If pain score is 4 or above: No action needed, pain <4.

## 2020-03-22 NOTE — Telephone Encounter (Signed)
Left message on f/u call 

## 2020-03-24 ENCOUNTER — Other Ambulatory Visit: Payer: Self-pay | Admitting: Internal Medicine

## 2020-03-24 MED ORDER — DICYCLOMINE HCL 20 MG PO TABS: 20.0000 mg | ORAL_TABLET | Freq: Three times a day (TID) | ORAL | 5 refills | Status: DC

## 2020-03-29 DIAGNOSIS — N170 Acute kidney failure with tubular necrosis: Secondary | ICD-10-CM | POA: Diagnosis not present

## 2020-03-29 DIAGNOSIS — E1129 Type 2 diabetes mellitus with other diabetic kidney complication: Secondary | ICD-10-CM | POA: Diagnosis not present

## 2020-03-29 DIAGNOSIS — R809 Proteinuria, unspecified: Secondary | ICD-10-CM | POA: Diagnosis not present

## 2020-03-29 DIAGNOSIS — N189 Chronic kidney disease, unspecified: Secondary | ICD-10-CM | POA: Diagnosis not present

## 2020-03-29 DIAGNOSIS — E1122 Type 2 diabetes mellitus with diabetic chronic kidney disease: Secondary | ICD-10-CM | POA: Diagnosis not present

## 2020-04-12 DIAGNOSIS — G4700 Insomnia, unspecified: Secondary | ICD-10-CM | POA: Diagnosis not present

## 2020-04-12 DIAGNOSIS — I10 Essential (primary) hypertension: Secondary | ICD-10-CM | POA: Diagnosis not present

## 2020-04-12 DIAGNOSIS — Z789 Other specified health status: Secondary | ICD-10-CM | POA: Diagnosis not present

## 2020-04-12 DIAGNOSIS — I509 Heart failure, unspecified: Secondary | ICD-10-CM | POA: Diagnosis not present

## 2020-04-12 DIAGNOSIS — E1151 Type 2 diabetes mellitus with diabetic peripheral angiopathy without gangrene: Secondary | ICD-10-CM | POA: Diagnosis not present

## 2020-04-12 DIAGNOSIS — W57XXXA Bitten or stung by nonvenomous insect and other nonvenomous arthropods, initial encounter: Secondary | ICD-10-CM | POA: Diagnosis not present

## 2020-04-12 DIAGNOSIS — Z299 Encounter for prophylactic measures, unspecified: Secondary | ICD-10-CM | POA: Diagnosis not present

## 2020-04-14 DIAGNOSIS — N170 Acute kidney failure with tubular necrosis: Secondary | ICD-10-CM | POA: Diagnosis not present

## 2020-04-14 DIAGNOSIS — R809 Proteinuria, unspecified: Secondary | ICD-10-CM | POA: Diagnosis not present

## 2020-04-14 DIAGNOSIS — E211 Secondary hyperparathyroidism, not elsewhere classified: Secondary | ICD-10-CM | POA: Diagnosis not present

## 2020-04-14 DIAGNOSIS — N189 Chronic kidney disease, unspecified: Secondary | ICD-10-CM | POA: Diagnosis not present

## 2020-04-14 DIAGNOSIS — I5032 Chronic diastolic (congestive) heart failure: Secondary | ICD-10-CM | POA: Diagnosis not present

## 2020-04-19 DIAGNOSIS — M4807 Spinal stenosis, lumbosacral region: Secondary | ICD-10-CM | POA: Diagnosis not present

## 2020-04-22 DIAGNOSIS — M4807 Spinal stenosis, lumbosacral region: Secondary | ICD-10-CM | POA: Diagnosis not present

## 2020-04-22 DIAGNOSIS — M4310 Spondylolisthesis, site unspecified: Secondary | ICD-10-CM | POA: Diagnosis not present

## 2020-04-22 DIAGNOSIS — M5416 Radiculopathy, lumbar region: Secondary | ICD-10-CM | POA: Diagnosis not present

## 2020-04-26 DIAGNOSIS — Z299 Encounter for prophylactic measures, unspecified: Secondary | ICD-10-CM | POA: Diagnosis not present

## 2020-04-26 DIAGNOSIS — I10 Essential (primary) hypertension: Secondary | ICD-10-CM | POA: Diagnosis not present

## 2020-04-26 DIAGNOSIS — I509 Heart failure, unspecified: Secondary | ICD-10-CM | POA: Diagnosis not present

## 2020-04-26 DIAGNOSIS — Z1231 Encounter for screening mammogram for malignant neoplasm of breast: Secondary | ICD-10-CM | POA: Diagnosis not present

## 2020-04-26 DIAGNOSIS — N390 Urinary tract infection, site not specified: Secondary | ICD-10-CM | POA: Diagnosis not present

## 2020-04-26 DIAGNOSIS — R339 Retention of urine, unspecified: Secondary | ICD-10-CM | POA: Diagnosis not present

## 2020-04-27 DIAGNOSIS — I10 Essential (primary) hypertension: Secondary | ICD-10-CM | POA: Diagnosis not present

## 2020-04-27 DIAGNOSIS — E119 Type 2 diabetes mellitus without complications: Secondary | ICD-10-CM | POA: Diagnosis not present

## 2020-05-05 DIAGNOSIS — K219 Gastro-esophageal reflux disease without esophagitis: Secondary | ICD-10-CM | POA: Diagnosis not present

## 2020-05-05 DIAGNOSIS — N390 Urinary tract infection, site not specified: Secondary | ICD-10-CM | POA: Diagnosis not present

## 2020-05-05 DIAGNOSIS — I10 Essential (primary) hypertension: Secondary | ICD-10-CM | POA: Diagnosis not present

## 2020-05-05 DIAGNOSIS — Z299 Encounter for prophylactic measures, unspecified: Secondary | ICD-10-CM | POA: Diagnosis not present

## 2020-05-05 DIAGNOSIS — G4700 Insomnia, unspecified: Secondary | ICD-10-CM | POA: Diagnosis not present

## 2020-05-16 ENCOUNTER — Encounter (HOSPITAL_COMMUNITY): Payer: Self-pay | Admitting: Emergency Medicine

## 2020-05-16 ENCOUNTER — Other Ambulatory Visit: Payer: Self-pay

## 2020-05-16 ENCOUNTER — Emergency Department (HOSPITAL_COMMUNITY): Payer: Medicare Other

## 2020-05-16 ENCOUNTER — Inpatient Hospital Stay (HOSPITAL_COMMUNITY)
Admission: EM | Admit: 2020-05-16 | Discharge: 2020-05-19 | DRG: 069 | Disposition: A | Discharge status: Home / Self Care | Payer: Medicare Other | Attending: Internal Medicine | Admitting: Internal Medicine

## 2020-05-16 DIAGNOSIS — Z20822 Contact with and (suspected) exposure to covid-19: Secondary | ICD-10-CM | POA: Diagnosis not present

## 2020-05-16 DIAGNOSIS — G459 Transient cerebral ischemic attack, unspecified: Principal | ICD-10-CM | POA: Diagnosis present

## 2020-05-16 DIAGNOSIS — H539 Unspecified visual disturbance: Secondary | ICD-10-CM

## 2020-05-16 DIAGNOSIS — E1140 Type 2 diabetes mellitus with diabetic neuropathy, unspecified: Secondary | ICD-10-CM | POA: Diagnosis not present

## 2020-05-16 DIAGNOSIS — I160 Hypertensive urgency: Secondary | ICD-10-CM | POA: Diagnosis not present

## 2020-05-16 DIAGNOSIS — G939 Disorder of brain, unspecified: Secondary | ICD-10-CM | POA: Diagnosis not present

## 2020-05-16 DIAGNOSIS — R29700 NIHSS score 0: Secondary | ICD-10-CM | POA: Diagnosis not present

## 2020-05-16 DIAGNOSIS — M109 Gout, unspecified: Secondary | ICD-10-CM | POA: Diagnosis present

## 2020-05-16 DIAGNOSIS — N179 Acute kidney failure, unspecified: Secondary | ICD-10-CM | POA: Diagnosis present

## 2020-05-16 DIAGNOSIS — N184 Chronic kidney disease, stage 4 (severe): Secondary | ICD-10-CM | POA: Diagnosis present

## 2020-05-16 DIAGNOSIS — E119 Type 2 diabetes mellitus without complications: Secondary | ICD-10-CM

## 2020-05-16 DIAGNOSIS — E876 Hypokalemia: Secondary | ICD-10-CM | POA: Diagnosis not present

## 2020-05-16 DIAGNOSIS — E782 Mixed hyperlipidemia: Secondary | ICD-10-CM | POA: Diagnosis not present

## 2020-05-16 DIAGNOSIS — I129 Hypertensive chronic kidney disease with stage 1 through stage 4 chronic kidney disease, or unspecified chronic kidney disease: Secondary | ICD-10-CM | POA: Diagnosis not present

## 2020-05-16 DIAGNOSIS — E1121 Type 2 diabetes mellitus with diabetic nephropathy: Secondary | ICD-10-CM | POA: Diagnosis not present

## 2020-05-16 DIAGNOSIS — Z66 Do not resuscitate: Secondary | ICD-10-CM | POA: Diagnosis present

## 2020-05-16 DIAGNOSIS — N189 Chronic kidney disease, unspecified: Secondary | ICD-10-CM

## 2020-05-16 DIAGNOSIS — Z794 Long term (current) use of insulin: Secondary | ICD-10-CM

## 2020-05-16 DIAGNOSIS — I10 Essential (primary) hypertension: Secondary | ICD-10-CM | POA: Diagnosis not present

## 2020-05-16 DIAGNOSIS — R519 Headache, unspecified: Secondary | ICD-10-CM | POA: Diagnosis not present

## 2020-05-16 DIAGNOSIS — E1122 Type 2 diabetes mellitus with diabetic chronic kidney disease: Secondary | ICD-10-CM | POA: Diagnosis not present

## 2020-05-16 DIAGNOSIS — E1165 Type 2 diabetes mellitus with hyperglycemia: Secondary | ICD-10-CM

## 2020-05-16 DIAGNOSIS — E860 Dehydration: Secondary | ICD-10-CM | POA: Diagnosis present

## 2020-05-16 DIAGNOSIS — H532 Diplopia: Secondary | ICD-10-CM | POA: Diagnosis not present

## 2020-05-16 DIAGNOSIS — K219 Gastro-esophageal reflux disease without esophagitis: Secondary | ICD-10-CM | POA: Diagnosis present

## 2020-05-16 DIAGNOSIS — I639 Cerebral infarction, unspecified: Secondary | ICD-10-CM

## 2020-05-16 LAB — CBC WITH DIFFERENTIAL/PLATELET
Abs Immature Granulocytes: 0.06 10*3/uL (ref 0.00–0.07)
Basophils Absolute: 0.1 10*3/uL (ref 0.0–0.1)
Basophils Relative: 1 %
Eosinophils Absolute: 0.2 10*3/uL (ref 0.0–0.5)
Eosinophils Relative: 2 %
HCT: 34.7 % — ABNORMAL LOW (ref 36.0–46.0)
Hemoglobin: 11.9 g/dL — ABNORMAL LOW (ref 12.0–15.0)
Immature Granulocytes: 1 %
Lymphocytes Relative: 31 %
Lymphs Abs: 2.1 10*3/uL (ref 0.7–4.0)
MCH: 32.2 pg (ref 26.0–34.0)
MCHC: 34.3 g/dL (ref 30.0–36.0)
MCV: 94 fL (ref 80.0–100.0)
Monocytes Absolute: 0.5 10*3/uL (ref 0.1–1.0)
Monocytes Relative: 8 %
Neutro Abs: 3.9 10*3/uL (ref 1.7–7.7)
Neutrophils Relative %: 57 %
Platelets: 231 10*3/uL (ref 150–400)
RBC: 3.69 MIL/uL — ABNORMAL LOW (ref 3.87–5.11)
RDW: 15.1 % (ref 11.5–15.5)
WBC: 6.7 10*3/uL (ref 4.0–10.5)
nRBC: 0 % (ref 0.0–0.2)

## 2020-05-16 LAB — BASIC METABOLIC PANEL
Anion gap: 13 (ref 5–15)
BUN: 36 mg/dL — ABNORMAL HIGH (ref 8–23)
CO2: 22 mmol/L (ref 22–32)
Calcium: 9.2 mg/dL (ref 8.9–10.3)
Chloride: 103 mmol/L (ref 98–111)
Creatinine, Ser: 1.96 mg/dL — ABNORMAL HIGH (ref 0.44–1.00)
GFR calc Af Amer: 30 mL/min — ABNORMAL LOW (ref >60)
GFR calc non Af Amer: 26 mL/min — ABNORMAL LOW (ref >60)
Glucose, Bld: 237 mg/dL — ABNORMAL HIGH (ref 70–99)
Potassium: 2.5 mmol/L — CL (ref 3.5–5.1)
Sodium: 138 mmol/L (ref 135–145)

## 2020-05-16 MED ORDER — POTASSIUM CHLORIDE CRYS ER 20 MEQ PO TBCR
60.0000 meq | EXTENDED_RELEASE_TABLET | Freq: Once | ORAL | Status: AC
  Administered 2020-05-16: 60 meq via ORAL
  Filled 2020-05-16: qty 3

## 2020-05-16 MED ORDER — METOCLOPRAMIDE HCL 5 MG/ML IJ SOLN
10.0000 mg | Freq: Once | INTRAMUSCULAR | Status: AC
  Administered 2020-05-16: 10 mg via INTRAVENOUS
  Filled 2020-05-16: qty 2

## 2020-05-16 MED ORDER — DIPHENHYDRAMINE HCL 50 MG/ML IJ SOLN
25.0000 mg | Freq: Once | INTRAMUSCULAR | Status: AC
  Administered 2020-05-16: 25 mg via INTRAVENOUS
  Filled 2020-05-16: qty 1

## 2020-05-16 MED ORDER — MAGNESIUM SULFATE 2 GM/50ML IV SOLN
2.0000 g | Freq: Once | INTRAVENOUS | Status: AC
  Administered 2020-05-16: 2 g via INTRAVENOUS
  Filled 2020-05-16: qty 50

## 2020-05-16 MED ORDER — SODIUM CHLORIDE 0.9 % IV SOLN: INTRAVENOUS | Status: DC

## 2020-05-16 MED ORDER — MORPHINE SULFATE (PF) 4 MG/ML IV SOLN
4.0000 mg | Freq: Once | INTRAVENOUS | Status: AC
  Administered 2020-05-16: 4 mg via INTRAVENOUS
  Filled 2020-05-16: qty 1

## 2020-05-16 MED ORDER — POTASSIUM CHLORIDE 10 MEQ/100ML IV SOLN
10.0000 meq | INTRAVENOUS | Status: AC
  Administered 2020-05-16 (×2): 10 meq via INTRAVENOUS
  Filled 2020-05-16 (×2): qty 100

## 2020-05-16 NOTE — ED Notes (Signed)
Patient transported to CT 

## 2020-05-16 NOTE — ED Provider Notes (Signed)
[facility] EMERGENCY DEPARTMENT Provider Note   CSN: [id number] Arrival date & time: 05/16/20  1901     History Chief Complaint  Patient presents with  . Headache    Heather [name] is a 69 y.o. female.  HPI   85yF with headache and double vision. Onset yesterday afternoon. Was at rest when symptoms began. First noticed a dull ache in the back of her head. Began worsening and developed double vision. Double vision goes way when she closes either eye. No eye pain. No photophobia. Headache is in the back of the head and upper neck. Has had headaches previously but not like this. No fever or chills. Nauseated and vomited earlier today. No changes in speech. Feels tired but no focal weakness.   Past Medical History:  Diagnosis Date  . Acute renal failure superimposed on stage 3 chronic kidney disease ([place]) 01/21/2016  . Anemia   . Anxiety   . B12 deficiency   . Cellulitis and abscess    Abdomen and buttocks  . CKD (chronic kidney disease) stage 3, GFR 30-59 ml/min   . Closed fracture of distal end of femur, unspecified fracture morphology, initial encounter ([place])   . Depression   . Diabetic neuropathy (HCC)    feet  . DJD (degenerative joint disease)    Right forminal stenosis C4-5  . Essential hypertension   . Femur fracture, right ([place]) 03/30/2017  . Folliculitis   . GERD (gastroesophageal reflux disease)   . Gout   . Headache(784.0)   . Hiatal hernia   . History of blood transfusion   . History of bronchitis   . History of cardiac catheterization    No significant CAD 2012  . History of kidney stones    surgery to remove  . Insomnia   . Irritable bowel syndrome   . Mixed hyperlipidemia   . Osteoarthritis   . Palpitations   . Reflux esophagitis   . Tinnitus    Right  . Type 2 diabetes mellitus [facility])     Patient Active Problem List   Diagnosis Date Noted  . Left hip pain 09/22/2019  . Lumbar radiculopathy 09/22/2019  . Postoperative anemia due to acute  blood loss 09/18/2019  . Postoperative urinary retention 09/18/2019  . Closed fracture of distal end of radius 05/26/2019  . Closed right hip fracture ([place]) 04/08/2018  . Salmonella   . Elevated lipase   . Gastroenteritis   . Protein-calorie malnutrition, severe 03/05/2018  . Neck pain 03/12/2017  . Hyperkalemia 01/06/2016  . Neuropathy 03/02/2015  . Pain in the chest   . Chest pain 12/22/2014  . Essential hypertension 12/22/2014  . Diabetes mellitus with chronic kidney disease ([place]) 12/22/2014  . Hiatal hernia 12/22/2014  . GERD (gastroesophageal reflux disease) 12/22/2014  . Gout 12/22/2014  . Mixed hyperlipidemia 12/22/2014  . Depression 12/22/2014  . Type 2 diabetes mellitus without complications ([place]) 95/62/1308  . Fusion of spine, cervicothoracic region 11/01/2014  . Pseudophakia, left eye 10/20/2014  . Chronic kidney disease, stage III (moderate) 08/25/2014  . Cervical stenosis of spine 08/26/2013  . Epigastric pain 05/13/2012  . Vitamin B12 deficiency 07/08/2011  . Personal history of failed moderate sedation 04/16/2011  . Chronic Nausea and Vomiting - ? gastroapresis 04/16/2011  . Early satiety 04/16/2011  . Irritable bowel syndrome 04/16/2011  . Benign paroxysmal positional vertigo 11/20/2010  . TRICUSPID REGURGITATION, MODERATE 11/20/2010  . PULMONARY HYPERTENSION, MILD 11/20/2010  . LEG EDEMA, BILATERAL 11/20/2010  . DYSPNEA 11/20/2010  .  NONSPECIFIC ABNORMAL ELECTROCARDIOGRAM 11/20/2010  . CHEST WALL PAIN, HX OF 11/20/2010    Past Surgical History:  Procedure Laterality Date  . BACK SURGERY    . c-spine surgery     03/2007  . CARDIAC CATHETERIZATION    . CHOLECYSTECTOMY    . COLONOSCOPY  04/21/2011; 05/29/11   6/12: [place] - ?AVM at IC valve, inflammatory changes at [facility] valve [facility]); 7/12 - gessner; IC valve erosions, look chronic and probably Crohn's per path  . colonoscopy  2017   [facility]: random biopsies normal. TI normal  .  ESOPHAGOGASTRODUODENOSCOPY  05/29/11   normal  . ESOPHAGOGASTRODUODENOSCOPY N/A 01/21/2016   Dr. Michail Sermon: normal  . EYE SURGERY Bilateral    cataracts removed  . FEMUR IM NAIL Right 04/10/2018   Procedure: INTRAMEDULLARY (IM) NAIL FEMORAL;  Surgeon: Paralee Cancel, MD;  Location: [facility];  Service: Orthopedics;  Laterality: Right;  . HARDWARE REMOVAL Right 04/10/2018   Procedure: HARDWARE REMOVAL RIGHT DISTAL FEMUR;  Surgeon: Paralee Cancel, MD;  Location: [facility];  Service: Orthopedics;  Laterality: Right;  . IR FLUORO GUIDE CV LINE RIGHT  04/01/2017  . IR US GUIDE VASC ACCESS RIGHT  04/01/2017  . JOINT REPLACEMENT Right    hip  . OPEN REDUCTION INTERNAL FIXATION (ORIF) DISTAL RADIAL FRACTURE Right 05/28/2019   Procedure: OPEN REDUCTION INTERNAL FIXATION (ORIF) DISTAL RADIAL FRACTURE;  Surgeon: Verner Mould, MD;  Location: [place];  Service: Orthopedics;  Laterality: Right;  with block 90 minutes  . ORIF FEMUR FRACTURE Right 03/31/2017   Procedure: OPEN REDUCTION INTERNAL FIXATION (ORIF) DISTAL FEMUR FRACTURE;  Surgeon: Paralee Cancel, MD;  Location: [place];  Service: Orthopedics;  Laterality: Right;  . removal of kidney stone    . Right knee arthroscopic surgery    . TONSILLECTOMY    . TOTAL ABDOMINAL HYSTERECTOMY       OB History   No obstetric history on file.     Family History  Problem Relation Age of Onset  . Diabetes Mother   . Colon cancer Brother        POSSIBLY. Patient states diagnosed at age 78, then later states age 30. unclear and limited historian  . Esophageal cancer Neg Hx   . Rectal cancer Neg Hx   . Stomach cancer Neg Hx     Social History   Tobacco Use  . Smoking status: Never Smoker  . Smokeless tobacco: Never Used  Vaping Use  . Vaping Use: Never used  Substance Use Topics  . Alcohol use: No  . Drug use: No    Home Medications Prior to Admission medications   Medication Sig Start Date End Date Taking? Authorizing Provider  allopurinol (ZYLOPRIM)  100 MG tablet Take 100 mg by mouth daily.   Yes [provider]  Ascorbic Acid (VITAMIN C WITH ROSE HIPS) 500 MG tablet Take 500 mg by mouth every morning.   Yes [provider]  aspirin EC 81 MG tablet Take 81 mg by mouth every morning.    Yes [provider]  atorvastatin (LIPITOR) 80 MG tablet Take 80 mg by mouth every morning.   Yes [provider]  Cholecalciferol (VITAMIN D) 2000 units CAPS Take 2,000 Units by mouth every morning.    Yes [provider]  citalopram (CELEXA) 40 MG tablet Take 40 mg by mouth daily.   Yes [provider]  cyanocobalamin (,VITAMIN B-12,) 1000 MCG/ML injection Inject 1,000 mcg into the muscle every 30 (thirty) days.  Yes [provider]  diazepam (VALIUM) 5 MG tablet Take 1 tablet (5 mg total) by mouth 2 (two) times daily as needed for anxiety. 04/14/18  Yes Mariel Aloe, MD  esomeprazole (NEXIUM) 40 MG capsule Take 40 mg by mouth daily before breakfast.     Yes [provider]  furosemide (LASIX) 40 MG tablet Take 1 tablet (40 mg total) by mouth daily. Patient taking differently: Take 40 mg by mouth every morning.  01/26/16  Yes Reyne Dumas, MD  hydrALAZINE (APRESOLINE) 25 MG tablet Take 25 mg every 8 hours as needed for systolic blood pressure over 150. 01/19/20  Yes Branch, Alphonse Guild, MD  Insulin Detemir (LEVEMIR FLEXTOUCH) 100 UNIT/ML Pen Inject 80 Units into the skin at bedtime. Patient taking differently: Inject 20 Units into the skin at bedtime. Patient adjust for her sugars 04/03/17  Yes Rosita Fire, MD  Insulin Lispro (HUMALOG KWIKPEN [place]) Inject 20 Units into the skin 3 (three) times daily with meals.    Yes [provider]  metoprolol (LOPRESSOR) 100 MG tablet Take 100 mg by mouth 2 (two) times daily.    Yes [provider]  potassium chloride SA (K-DUR,KLOR-CON) 20 MEQ tablet Take 20 mEq by mouth every morning.    Yes [provider]   Semaglutide,0.25 or 0.5MG /DOS, (OZEMPIC, 0.25 OR 0.5 MG/DOSE,) 2 MG/1.5ML SOPN Inject 2 mg into the skin every Sunday.    Yes [provider]  tiZANidine (ZANAFLEX) 4 MG tablet Take 8 mg by mouth at bedtime. 03/28/20  Yes [provider]  venlafaxine XR (EFFEXOR-XR) 75 MG 24 hr capsule Take 75 mg by mouth daily. 05/05/20  Yes [provider]  zolpidem (AMBIEN) 10 MG tablet Take 10 mg by mouth at bedtime. 05/15/20  Yes [provider]  dicyclomine (BENTYL) 20 MG tablet Take 1 tablet (20 mg total) by mouth 4 (four) times daily -  before meals and at bedtime. Patient not taking: Reported on 05/16/2020 03/24/20   Gatha Mayer, MD  fluconazole (DIFLUCAN) 200 MG tablet Take 200 mg by mouth daily. 10 day course starting on 05/05/2020 Patient not taking: Reported on 05/16/2020 05/05/20   [provider]  ondansetron (ZOFRAN ODT) 4 MG disintegrating tablet Take 1 tablet (4 mg total) by mouth every 8 (eight) hours as needed for nausea or vomiting. Patient not taking: Reported on 05/16/2020 03/02/20   Henderly, Britni A, PA-C  ondansetron (ZOFRAN) 4 MG tablet Take 1 tablet (4 mg total) by mouth every 8 (eight) hours as needed for nausea or vomiting. Patient not taking: Reported on 05/16/2020 06/10/18   Gatha Mayer, MD  oxyCODONE (ROXICODONE) 5 MG immediate release tablet Take 1 tablet (5 mg total) by mouth every 6 (six) hours as needed for up to 10 doses for severe pain. Patient not taking: Reported on 05/16/2020 02/03/20   Levin Erp, PA  promethazine (PHENERGAN) 25 MG tablet Take 1 tablet (25 mg total) by mouth every 6 (six) hours as needed for nausea or vomiting. Patient not taking: Reported on 05/16/2020 02/03/20   Levin Erp, PA  sucralfate (CARAFATE) 1 g tablet Take 1 tablet (1 g total) by mouth every 6 (six) hours as needed. Patient not taking: Reported on 05/16/2020 02/05/20   Yetta Flock, MD    Allergies    Duloxetine hcl, Fluocinolone,  Acetaminophen, Amlodipine besylate, Ciprofloxacin, Hydrocodone-acetaminophen, Metformin, Nsaids, Quinolones, Sulfamethoxazole, Valsartan, Cozaar [losartan], Lisinopril, and Tape  Review of Systems   Review of  Systems All systems reviewed and negative, other than as noted in HPI.  Physical Exam Updated Vital Signs BP (!) 179/64   Pulse 76   Temp 98.3 F (36.8 C) (Oral)   Resp 18   Ht 5\' 3"  (1.6 m)   Wt 60.3 kg   LMP  (LMP Unknown)   SpO2 100%   BMI 23.56 kg/m   Physical Exam Vitals and nursing note reviewed.  Constitutional:      General: She is not in acute distress.    Appearance: She is well-developed.  HENT:     Head: Normocephalic and atraumatic.  Eyes:     General:        Right eye: No discharge.        Left eye: No discharge.     Conjunctiva/sclera: Conjunctivae normal.  Cardiovascular:     Rate and Rhythm: Normal rate and regular rhythm.     Heart sounds: Normal heart sounds. No murmur heard.  No friction rub. No gallop.   Pulmonary:     Effort: Pulmonary effort is normal. No respiratory distress.     Breath sounds: Normal breath sounds.  Abdominal:     General: There is no distension.     Palpations: Abdomen is soft.     Tenderness: There is no abdominal tenderness.  Musculoskeletal:        General: No tenderness.     Cervical back: Neck supple. No rigidity.  Skin:    General: Skin is warm and dry.  Neurological:     General: No focal deficit present.     Mental Status: She is alert and oriented to person, place, and time.     Cranial Nerves: No cranial nerve deficit.     Sensory: No sensory deficit.     Motor: No weakness.     Coordination: Coordination normal.  Psychiatric:        Behavior: Behavior normal.        Thought Content: Thought content normal.     ED Results / Procedures / Treatments   Labs (all labs ordered are listed, but only abnormal results are displayed) Labs Reviewed  BASIC METABOLIC PANEL - Abnormal; Notable for the  following components:      Result Value   Potassium 2.5 (*)    Glucose, Bld 237 (*)    BUN 36 (*)    Creatinine, Ser 1.96 (*)    GFR calc non Af Amer 26 (*)    GFR calc Af Amer 30 (*)    All other components within normal limits  CBC WITH DIFFERENTIAL/PLATELET - Abnormal; Notable for the following components:   RBC 3.69 (*)    Hemoglobin 11.9 (*)    HCT 34.7 (*)    All other components within normal limits  MAGNESIUM - Abnormal; Notable for the following components:   Magnesium 1.4 (*)    All other components within normal limits  COMPREHENSIVE METABOLIC PANEL - Abnormal; Notable for the following components:   CO2 20 (*)    Glucose, Bld 158 (*)    BUN 31 (*)    Creatinine, Ser 1.65 (*)    Calcium 8.6 (*)    GFR calc non Af Amer 32 (*)    GFR calc Af Amer 37 (*)    All other components within normal limits  CBC - Abnormal; Notable for the following components:   RBC 3.56 (*)    Hemoglobin 11.4 (*)    HCT 34.7 (*)    All other  components within normal limits  PHOSPHORUS - Abnormal; Notable for the following components:   Phosphorus 2.2 (*)    All other components within normal limits  HEMOGLOBIN A1C - Abnormal; Notable for the following components:   Hgb A1c MFr Bld 6.9 (*)    All other components within normal limits  LIPID PANEL - Abnormal; Notable for the following components:   Cholesterol 320 (*)    Triglycerides 629 (*)    HDL 30 (*)    All other components within normal limits  GLUCOSE, CAPILLARY - Abnormal; Notable for the following components:   Glucose-Capillary 179 (*)    All other components within normal limits  VITAMIN B12 - Abnormal; Notable for the following components:   Vitamin B-12 1,034 (*)    All other components within normal limits  GLUCOSE, CAPILLARY - Abnormal; Notable for the following components:   Glucose-Capillary 151 (*)    All other components within normal limits  GLUCOSE, CAPILLARY - Abnormal; Notable for the following components:    Glucose-Capillary 142 (*)    All other components within normal limits  GLUCOSE, CAPILLARY - Abnormal; Notable for the following components:   Glucose-Capillary 221 (*)    All other components within normal limits  GLUCOSE, CAPILLARY - Abnormal; Notable for the following components:   Glucose-Capillary 176 (*)    All other components within normal limits  GLUCOSE, CAPILLARY - Abnormal; Notable for the following components:   Glucose-Capillary 194 (*)    All other components within normal limits  GLUCOSE, CAPILLARY - Abnormal; Notable for the following components:   Glucose-Capillary 141 (*)    All other components within normal limits  BASIC METABOLIC PANEL - Abnormal; Notable for the following components:   CO2 19 (*)    Glucose, Bld 190 (*)    Creatinine, Ser 1.17 (*)    GFR calc non Af Amer 48 (*)    GFR calc Af Amer 56 (*)    All other components within normal limits  MAGNESIUM - Abnormal; Notable for the following components:   Magnesium 1.2 (*)    All other components within normal limits  GLUCOSE, CAPILLARY - Abnormal; Notable for the following components:   Glucose-Capillary 206 (*)    All other components within normal limits  GLUCOSE, CAPILLARY - Abnormal; Notable for the following components:   Glucose-Capillary 170 (*)    All other components within normal limits  GLUCOSE, CAPILLARY - Abnormal; Notable for the following components:   Glucose-Capillary 267 (*)    All other components within normal limits  SARS CORONAVIRUS 2 BY RT PCR (HOSPITAL ORDER, [facility])  HIV ANTIBODY (ROUTINE TESTING W REFLEX)  PROTIME-INR  APTT  MAGNESIUM  LDL CHOLESTEROL, DIRECT    EKG None  Radiology CT Head Wo Contrast  Result Date: 05/16/2020 CLINICAL DATA:  Headache double vision EXAM: CT HEAD WITHOUT CONTRAST TECHNIQUE: Contiguous axial images were obtained from the base of the skull through the vertex without intravenous contrast. COMPARISON:  CT  03/02/2020 FINDINGS: Brain: No acute territorial infarction, hemorrhage or intracranial mass. Mild atrophy. Stable ventricle size. Vascular: No hyperdense vessels.  Carotid vascular calcification Skull: Normal. Negative for fracture or focal lesion. Sinuses/Orbits: No acute finding. Other: None IMPRESSION: No CT evidence for acute intracranial abnormality. Electronically Signed   By: Donavan Foil M.D.   On: 05/16/2020 21:37    Procedures Procedures (including critical care time)  Medications Ordered in ED Medications  0.9 %  sodium chloride infusion (  Intravenous New Bag/Given (Non-Interop) 05/16/20 2102)  potassium chloride 10 mEq in 100 mL IVPB (10 mEq Intravenous New Bag/Given (Non-Interop) 05/16/20 2234)  magnesium sulfate IVPB 2 g 50 mL (2 g Intravenous New Bag/Given (Non-Interop) 05/16/20 2234)  metoCLOPramide (REGLAN) injection 10 mg (10 mg Intravenous Given 05/16/20 2102)  diphenhydrAMINE (BENADRYL) injection 25 mg (25 mg Intravenous Given 05/16/20 2101)  potassium chloride SA (KLOR-CON) CR tablet 60 mEq (60 mEq Oral Given 05/16/20 2235)    ED Course  I have reviewed the triage vital signs and the nursing notes.  Pertinent labs & imaging results that were available during my care of the patient were reviewed by me and considered in my medical decision making (see chart for details).    MDM Rules/Calculators/A&P                          68yF with headache and binocular diplopia. No eye pain. CT head w/o acute abnormality. I don't appreciate a CN palsy. Renal function is going to preclude CTa.  Will obtain teleneurology consult. Would appreciate their input.   Significant hypokalemia. Will replete.   Final Clinical Impression(s) / ED Diagnoses Final diagnoses:  Acute nonintractable headache, unspecified headache type  Diplopia  Hypokalemia    Rx / DC Orders ED Discharge Orders    None       Virgel Manifold, MD 05/20/20 1740

## 2020-05-16 NOTE — ED Triage Notes (Signed)
Pt c/o headache with double vision, emesis and diarrhea x 2 days.

## 2020-05-16 NOTE — ED Notes (Signed)
Date and time results received: 05/16/20 9:46 PM (use smartphrase ".now" to insert current time)  Test: potassium Critical Value: 2.5  Name of Provider Notified: kohut  Orders Received? Or Actions Taken?:

## 2020-05-17 ENCOUNTER — Observation Stay (HOSPITAL_COMMUNITY): Payer: Medicare Other

## 2020-05-17 ENCOUNTER — Observation Stay (HOSPITAL_BASED_OUTPATIENT_CLINIC_OR_DEPARTMENT_OTHER): Payer: Medicare Other

## 2020-05-17 DIAGNOSIS — N184 Chronic kidney disease, stage 4 (severe): Secondary | ICD-10-CM | POA: Diagnosis present

## 2020-05-17 DIAGNOSIS — R519 Headache, unspecified: Secondary | ICD-10-CM | POA: Diagnosis not present

## 2020-05-17 DIAGNOSIS — R270 Ataxia, unspecified: Secondary | ICD-10-CM | POA: Diagnosis not present

## 2020-05-17 DIAGNOSIS — R51 Headache with orthostatic component, not elsewhere classified: Secondary | ICD-10-CM | POA: Diagnosis not present

## 2020-05-17 DIAGNOSIS — I160 Hypertensive urgency: Secondary | ICD-10-CM | POA: Diagnosis not present

## 2020-05-17 DIAGNOSIS — E876 Hypokalemia: Secondary | ICD-10-CM | POA: Diagnosis present

## 2020-05-17 DIAGNOSIS — N179 Acute kidney failure, unspecified: Secondary | ICD-10-CM | POA: Diagnosis not present

## 2020-05-17 DIAGNOSIS — H532 Diplopia: Secondary | ICD-10-CM

## 2020-05-17 DIAGNOSIS — I129 Hypertensive chronic kidney disease with stage 1 through stage 4 chronic kidney disease, or unspecified chronic kidney disease: Secondary | ICD-10-CM | POA: Diagnosis present

## 2020-05-17 DIAGNOSIS — E860 Dehydration: Secondary | ICD-10-CM | POA: Diagnosis not present

## 2020-05-17 DIAGNOSIS — H539 Unspecified visual disturbance: Secondary | ICD-10-CM

## 2020-05-17 DIAGNOSIS — I63233 Cerebral infarction due to unspecified occlusion or stenosis of bilateral carotid arteries: Secondary | ICD-10-CM | POA: Diagnosis not present

## 2020-05-17 DIAGNOSIS — G9389 Other specified disorders of brain: Secondary | ICD-10-CM | POA: Diagnosis not present

## 2020-05-17 DIAGNOSIS — M2548 Effusion, other site: Secondary | ICD-10-CM | POA: Diagnosis not present

## 2020-05-17 DIAGNOSIS — I6782 Cerebral ischemia: Secondary | ICD-10-CM | POA: Diagnosis not present

## 2020-05-17 DIAGNOSIS — E1140 Type 2 diabetes mellitus with diabetic neuropathy, unspecified: Secondary | ICD-10-CM | POA: Diagnosis present

## 2020-05-17 DIAGNOSIS — E119 Type 2 diabetes mellitus without complications: Secondary | ICD-10-CM

## 2020-05-17 DIAGNOSIS — Z20822 Contact with and (suspected) exposure to covid-19: Secondary | ICD-10-CM | POA: Diagnosis present

## 2020-05-17 DIAGNOSIS — E782 Mixed hyperlipidemia: Secondary | ICD-10-CM

## 2020-05-17 DIAGNOSIS — I10 Essential (primary) hypertension: Secondary | ICD-10-CM | POA: Diagnosis not present

## 2020-05-17 DIAGNOSIS — G939 Disorder of brain, unspecified: Secondary | ICD-10-CM | POA: Diagnosis present

## 2020-05-17 DIAGNOSIS — E1122 Type 2 diabetes mellitus with diabetic chronic kidney disease: Secondary | ICD-10-CM | POA: Diagnosis present

## 2020-05-17 DIAGNOSIS — R29700 NIHSS score 0: Secondary | ICD-10-CM | POA: Diagnosis present

## 2020-05-17 DIAGNOSIS — Z66 Do not resuscitate: Secondary | ICD-10-CM | POA: Diagnosis present

## 2020-05-17 DIAGNOSIS — K219 Gastro-esophageal reflux disease without esophagitis: Secondary | ICD-10-CM | POA: Diagnosis not present

## 2020-05-17 DIAGNOSIS — J3489 Other specified disorders of nose and nasal sinuses: Secondary | ICD-10-CM | POA: Diagnosis not present

## 2020-05-17 DIAGNOSIS — M109 Gout, unspecified: Secondary | ICD-10-CM

## 2020-05-17 DIAGNOSIS — N189 Chronic kidney disease, unspecified: Secondary | ICD-10-CM

## 2020-05-17 DIAGNOSIS — G459 Transient cerebral ischemic attack, unspecified: Secondary | ICD-10-CM | POA: Diagnosis present

## 2020-05-17 LAB — GLUCOSE, CAPILLARY
Glucose-Capillary: 179 mg/dL — ABNORMAL HIGH (ref 70–99)
Glucose-Capillary: 151 mg/dL — ABNORMAL HIGH (ref 70–99)
Glucose-Capillary: 142 mg/dL — ABNORMAL HIGH (ref 70–99)
Glucose-Capillary: 221 mg/dL — ABNORMAL HIGH (ref 70–99)

## 2020-05-17 LAB — ECHOCARDIOGRAM COMPLETE
AR max vel: 1.72 cm2
AV Area VTI: 1.77 cm2
AV Area mean vel: 1.48 cm2
AV Mean grad: 6.1 mmHg
AV Peak grad: 10.8 mmHg
Ao pk vel: 1.64 m/s
Area-P 1/2: 2.82 cm2
Height: 63 in
S' Lateral: 2.89 cm
Weight: 2128 oz

## 2020-05-17 LAB — MAGNESIUM
Magnesium: 1.4 mg/dL — ABNORMAL LOW (ref 1.7–2.4)
Magnesium: 1.8 mg/dL (ref 1.7–2.4)

## 2020-05-17 LAB — COMPREHENSIVE METABOLIC PANEL
ALT: 15 U/L (ref 0–44)
AST: 18 U/L (ref 15–41)
Albumin: 4 g/dL (ref 3.5–5.0)
Alkaline Phosphatase: 57 U/L (ref 38–126)
Anion gap: 11 (ref 5–15)
BUN: 31 mg/dL — ABNORMAL HIGH (ref 8–23)
CO2: 20 mmol/L — ABNORMAL LOW (ref 22–32)
Calcium: 8.6 mg/dL — ABNORMAL LOW (ref 8.9–10.3)
Chloride: 107 mmol/L (ref 98–111)
Creatinine, Ser: 1.65 mg/dL — ABNORMAL HIGH (ref 0.44–1.00)
GFR calc Af Amer: 37 mL/min — ABNORMAL LOW (ref >60)
GFR calc non Af Amer: 32 mL/min — ABNORMAL LOW (ref >60)
Glucose, Bld: 158 mg/dL — ABNORMAL HIGH (ref 70–99)
Potassium: 3.7 mmol/L (ref 3.5–5.1)
Sodium: 138 mmol/L (ref 135–145)
Total Bilirubin: 0.4 mg/dL (ref 0.3–1.2)
Total Protein: 7.1 g/dL (ref 6.5–8.1)

## 2020-05-17 LAB — CBC
HCT: 34.7 % — ABNORMAL LOW (ref 36.0–46.0)
Hemoglobin: 11.4 g/dL — ABNORMAL LOW (ref 12.0–15.0)
MCH: 32 pg (ref 26.0–34.0)
MCHC: 32.9 g/dL (ref 30.0–36.0)
MCV: 97.5 fL (ref 80.0–100.0)
Platelets: 235 10*3/uL (ref 150–400)
RBC: 3.56 MIL/uL — ABNORMAL LOW (ref 3.87–5.11)
RDW: 14.9 % (ref 11.5–15.5)
WBC: 7.6 10*3/uL (ref 4.0–10.5)
nRBC: 0 % (ref 0.0–0.2)

## 2020-05-17 LAB — PHOSPHORUS: Phosphorus: 2.2 mg/dL — ABNORMAL LOW (ref 2.5–4.6)

## 2020-05-17 LAB — SARS CORONAVIRUS 2 BY RT PCR (HOSPITAL ORDER, PERFORMED IN ~~LOC~~ HOSPITAL LAB): SARS Coronavirus 2: NEGATIVE

## 2020-05-17 LAB — LDL CHOLESTEROL, DIRECT: Direct LDL: 96.9 mg/dL (ref 0–99)

## 2020-05-17 LAB — LIPID PANEL
Cholesterol: 320 mg/dL — ABNORMAL HIGH (ref 0–200)
HDL: 30 mg/dL — ABNORMAL LOW (ref >40)
LDL Cholesterol: UNDETERMINED mg/dL (ref 0–99)
Total CHOL/HDL Ratio: 10.7 RATIO
Triglycerides: 629 mg/dL — ABNORMAL HIGH (ref <150)
VLDL: UNDETERMINED mg/dL (ref 0–40)

## 2020-05-17 LAB — PROTIME-INR
INR: 1 (ref 0.8–1.2)
Prothrombin Time: 13 seconds (ref 11.4–15.2)

## 2020-05-17 LAB — HEMOGLOBIN A1C
Hgb A1c MFr Bld: 6.9 % — ABNORMAL HIGH (ref 4.8–5.6)
Mean Plasma Glucose: 151.33 mg/dL

## 2020-05-17 LAB — APTT: aPTT: 28 seconds (ref 24–36)

## 2020-05-17 LAB — VITAMIN B12: Vitamin B-12: 1034 pg/mL — ABNORMAL HIGH (ref 180–914)

## 2020-05-17 LAB — HIV ANTIBODY (ROUTINE TESTING W REFLEX): HIV Screen 4th Generation wRfx: NONREACTIVE

## 2020-05-17 MED ORDER — DIPHENHYDRAMINE HCL 50 MG/ML IJ SOLN
25.0000 mg | Freq: Once | INTRAMUSCULAR | Status: AC
  Administered 2020-05-17: 25 mg via INTRAVENOUS
  Filled 2020-05-17: qty 1

## 2020-05-17 MED ORDER — ATORVASTATIN CALCIUM 40 MG PO TABS
80.0000 mg | ORAL_TABLET | Freq: Every morning | ORAL | Status: DC
  Administered 2020-05-17 – 2020-05-19 (×3): 80 mg via ORAL
  Filled 2020-05-17 (×3): qty 2

## 2020-05-17 MED ORDER — METOCLOPRAMIDE HCL 5 MG/ML IJ SOLN
10.0000 mg | Freq: Once | INTRAMUSCULAR | Status: AC
  Administered 2020-05-17: 10 mg via INTRAVENOUS
  Filled 2020-05-17: qty 2

## 2020-05-17 MED ORDER — TOPIRAMATE 25 MG PO TABS
12.5000 mg | ORAL_TABLET | Freq: Two times a day (BID) | ORAL | Status: DC
  Administered 2020-05-17 – 2020-05-18 (×3): 12.5 mg via ORAL
  Filled 2020-05-17: qty 1
  Filled 2020-05-17 (×3): qty 0.5
  Filled 2020-05-17: qty 1
  Filled 2020-05-17 (×2): qty 0.5
  Filled 2020-05-17: qty 1
  Filled 2020-05-17 (×2): qty 0.5

## 2020-05-17 MED ORDER — SUMATRIPTAN SUCCINATE 50 MG PO TABS
25.0000 mg | ORAL_TABLET | ORAL | Status: DC | PRN
  Administered 2020-05-17 – 2020-05-19 (×6): 25 mg via ORAL
  Filled 2020-05-17 (×6): qty 1

## 2020-05-17 MED ORDER — OMEGA-3-ACID ETHYL ESTERS 1 G PO CAPS
1.0000 g | ORAL_CAPSULE | Freq: Two times a day (BID) | ORAL | Status: DC
  Administered 2020-05-17 – 2020-05-19 (×5): 1 g via ORAL
  Filled 2020-05-17 (×5): qty 1

## 2020-05-17 MED ORDER — METOPROLOL TARTRATE 50 MG PO TABS
100.0000 mg | ORAL_TABLET | Freq: Two times a day (BID) | ORAL | Status: DC
  Administered 2020-05-17 – 2020-05-19 (×5): 100 mg via ORAL
  Filled 2020-05-17 (×4): qty 2
  Filled 2020-05-17: qty 4

## 2020-05-17 MED ORDER — HYDRALAZINE HCL 10 MG PO TABS
10.0000 mg | ORAL_TABLET | Freq: Three times a day (TID) | ORAL | Status: DC | PRN
  Administered 2020-05-17: 10 mg via ORAL
  Filled 2020-05-17: qty 1

## 2020-05-17 MED ORDER — HYDRALAZINE HCL 10 MG PO TABS
10.0000 mg | ORAL_TABLET | Freq: Once | ORAL | Status: AC
  Administered 2020-05-17: 10 mg via ORAL
  Filled 2020-05-17 (×2): qty 1

## 2020-05-17 MED ORDER — INSULIN ASPART 100 UNIT/ML ~~LOC~~ SOLN
0.0000 [IU] | Freq: Three times a day (TID) | SUBCUTANEOUS | Status: DC
  Administered 2020-05-17 (×2): 2 [IU] via SUBCUTANEOUS
  Administered 2020-05-17: 1 [IU] via SUBCUTANEOUS
  Administered 2020-05-18 (×2): 2 [IU] via SUBCUTANEOUS
  Administered 2020-05-18: 1 [IU] via SUBCUTANEOUS
  Administered 2020-05-19: 5 [IU] via SUBCUTANEOUS
  Administered 2020-05-19: 2 [IU] via SUBCUTANEOUS

## 2020-05-17 MED ORDER — PROMETHAZINE HCL 25 MG/ML IJ SOLN
12.5000 mg | Freq: Once | INTRAMUSCULAR | Status: AC
  Administered 2020-05-17: 12.5 mg via INTRAVENOUS
  Filled 2020-05-17: qty 1

## 2020-05-17 MED ORDER — POTASSIUM CHLORIDE CRYS ER 20 MEQ PO TBCR
20.0000 meq | EXTENDED_RELEASE_TABLET | Freq: Every morning | ORAL | Status: DC
  Administered 2020-05-17 – 2020-05-19 (×3): 20 meq via ORAL
  Filled 2020-05-17 (×3): qty 1

## 2020-05-17 MED ORDER — LABETALOL HCL 5 MG/ML IV SOLN
20.0000 mg | INTRAVENOUS | Status: DC | PRN
  Administered 2020-05-17 – 2020-05-19 (×4): 20 mg via INTRAVENOUS
  Filled 2020-05-17 (×4): qty 4

## 2020-05-17 MED ORDER — ASPIRIN EC 81 MG PO TBEC
81.0000 mg | DELAYED_RELEASE_TABLET | Freq: Every morning | ORAL | Status: DC
  Administered 2020-05-17 – 2020-05-19 (×3): 81 mg via ORAL
  Filled 2020-05-17 (×3): qty 1

## 2020-05-17 MED ORDER — PANTOPRAZOLE SODIUM 40 MG PO TBEC
40.0000 mg | DELAYED_RELEASE_TABLET | Freq: Every day | ORAL | Status: DC
  Administered 2020-05-17 – 2020-05-19 (×3): 40 mg via ORAL
  Filled 2020-05-17 (×3): qty 1

## 2020-05-17 MED ORDER — INSULIN ASPART 100 UNIT/ML ~~LOC~~ SOLN
0.0000 [IU] | Freq: Every day | SUBCUTANEOUS | Status: DC
  Administered 2020-05-17 – 2020-05-18 (×2): 2 [IU] via SUBCUTANEOUS

## 2020-05-17 MED ORDER — PROCHLORPERAZINE EDISYLATE 10 MG/2ML IJ SOLN
10.0000 mg | Freq: Once | INTRAMUSCULAR | Status: AC
  Administered 2020-05-17: 10 mg via INTRAVENOUS
  Filled 2020-05-17: qty 2

## 2020-05-17 MED ORDER — SODIUM CHLORIDE 0.9 % IV SOLN: Freq: Once | INTRAVENOUS | Status: AC

## 2020-05-17 NOTE — Progress Notes (Signed)
*  PRELIMINARY RESULTS* Echocardiogram 2D Echocardiogram has been performed.  Heather [name] 05/17/2020, 10:32 AM

## 2020-05-17 NOTE — Progress Notes (Signed)
Patient seen and examined.  Admitted after midnight secondary to diffuse/significant headache, with associated diplopia and involvement of her shoulders and neck. No fever, hemodynamically stable (except for permissive HTN).  No nausea, no vomiting.  Please refer to H&P written by Dr. Josephine Cables on 05/17/2020 for further info/details on admission.  Plan: -Follow neurology recommendations. -continue adjusting antihypertensive regimen. -follow electrolytes and further replete as needed. -continue the use of analgesics for HA's and given concerns for underlying debilitating migraine will start topamax. -follow MRI results and complete TIA work up.  Barton Dubois MD [phone]

## 2020-05-17 NOTE — ED Provider Notes (Addendum)
Heather [name] was left at change of shift to get results of her teleneurology consult.  She also had a significant hypokalemia which was started to be treated in the ED.   12:09 AM teleneurology called.  He feels Heather [name] has a 3rd or 6th nerve palsy.  He recommends that she be admitted and get an MRI which can be done here later this morning since she is outside a 24-hour window from when her symptoms started.  He states he would not load her with aspirin or Plavix until the results of the MRI are obtained.  I will talk to the hospitalist about admission.  I talked to the Heather [name] at 12:20 AM.  We discussed the neurologist recommended admission and she is agreeable.  She states she still having a headache.  Heather [name] has received Benadryl and Reglan for her headache.  She does have a reaction to flucinolone with swelling and NSAID for PUD, so I was considering doing Toradol or Decadron until I saw these allergies.  Review of the [place] shows Heather [name] got #8 oxycodone 5 mg tablets January 2, #60 oxycodone 5 mg January 6, #60 oxycodone 5 mg on January 19, #50 oxycodone 5 mg tablets on February 2, #10 oxycodone 5 mg tablets on April 7 and then she was started on Ambien No. 30 tablets monthly May 19, June 15, and July 18.  She has gotten 49 controlled substance prescriptions by 13 prescribers in the past 2 years. She was given compazine 10 mg IV.   12:58 AM Dr Josephine Cables, Hospitalist, will admit, requests hydralazine 10 mg IV for her BP 180/79.   When I tried to write for hydralazine 10 mg IV I got a notice there was an extreme shortage so she was given it orally.  Diagnoses that have been ruled out:  None  Diagnoses that are still under consideration:  None  Final diagnoses:  Acute nonintractable headache, unspecified headache type  Diplopia  Hypokalemia  Hypomagnesemia   Plan admission  Rolland Porter, MD, Barbette Or, MD 05/17/20 0100    Rolland Porter, MD 05/17/20 [phone]

## 2020-05-17 NOTE — Care Management Obs Status (Signed)
[place]   Patient Details  Name: Heather [name] MRN: [id number] Date of Birth: Jun 22, 1951   Medicare Observation Status Notification Given:  Yes    Tommy Medal 05/17/2020, 3:35 PM

## 2020-05-17 NOTE — Plan of Care (Signed)

## 2020-05-17 NOTE — Consult Note (Signed)
[facility] Tele[facility]rology Consult Services  Stat Consult  Date of Service:   05/16/2020 23:04:54  Impression:     .  H53.2 - Diplopia  Comments/Sign-Out: Ms Silveria is a 69 year old woman with hx of htn, hld and dm here with diplopia. She does not have a clear EOM pathology but given leftward gaze makes vision worse, there is concern for either a 6th or 3rd nerve palsy. The headache be secondary to the vision change, or perhaps a sign of htn that could have caused a stroke. Recommend admission with MRI brain for further evaluation.  CT HEAD: Showed No Acute Hemorrhage or Acute Core Infarct  Metrics: [facility] Notification Time: 05/16/2020 23:02:04 Stamp Time: 05/16/2020 23:04:54 Callback Response Time: 05/16/2020 23:06:54  Our recommendations are outlined below.   Disposition: Neurology Follow Up Recommended  Sign Out:     .  Discussed with Emergency Department Provider  ----------------------------------------------------------------------------------------------------  Chief Complaint: headache  History of Present Illness: Patient is a 69 year old Female.  Ms Goding is a 69 year old woman with history of htn, dm, ckd here with headache with diplopia. Yesterday, around 9 am she began having a headache. Her headache was mild, without light or sound sensitivity but she was nauseous. Then this am she woke with diplopia. It is worse when she looks to the left. She denies any new weakness today. She denies vision loss.           Examination: BP(180/79), Pulse(69), Blood Glucose(237) 1A: Level of Consciousness - Alert; keenly responsive + 0 1B: Ask Month and Age - Both Questions Right + 0 1C: Blink Eyes & Squeeze Hands - Performs Both Tasks + 0 2: Test Horizontal Extraocular Movements - Normal + 0 3: Test Visual Fields - No Visual Loss + 0 4: Test Facial Palsy (Use Grimace if Obtunded) - Normal symmetry + 0 5A: Test Left Arm Motor Drift -  No Drift for 10 Seconds + 0 5B: Test Right Arm Motor Drift - No Drift for 10 Seconds + 0 6A: Test Left Leg Motor Drift - No Drift for 5 Seconds + 0 6B: Test Right Leg Motor Drift - No Drift for 5 Seconds + 0 7: Test Limb Ataxia (FNF/Heel-Shin) - No Ataxia + 0 8: Test Sensation - Normal; No sensory loss + 0 9: Test Language/Aphasia - Normal; No aphasia + 0 10: Test Dysarthria - Normal + 0 11: Test Extinction/Inattention - No abnormality + 0  NIHSS Score: 0    Patient is being evaluated for possible acute neurologic impairment and high probability of imminent or life-threatening deterioration. I spent total of 25 minutes providing care to this patient, including time for face to face visit via telemedicine, review of medical records, imaging studies and discussion of findings with providers, the patient and/or family.   Dr Ashley Jacobs   [facility] [phone]  Case [id number]

## 2020-05-17 NOTE — H&P (Signed)
History and Physical  Heather [name] IWP:[id number] DOB: [birth year] DOA: 05/16/2020  Referring physician: Virgel Manifold, MD PCP: [name], MD  Patient coming from: Home  Chief Complaint: Headache  HPI: Heather [name] is a 69 y.o. female with medical history significant for DM2, CAD,HTN, Gout, Backpain s/p posterior spinal fusion of L3-L5on 09/14/2019 at [facility] who presents to the emergency department due to onset of headache and double vision which started around 9 AM yesterday.  Headache started while sitting on the sofa, pain was in the frontal area and this migrated to the back of the head and extended to neck and bilateral shoulders and this was associated with the development of double vision.  She denies any aggravating/elevating factor, patient denies tobacco use, alcohol or any recreational drug use.  She denied drinking coffee prior to onset of symptoms. She states that she checked her blood pressure and it was 240/100, she endorsed nausea and nonbloody vomiting x1, but denies any extremity weakness, numbness, tingling, chest pain or shortness of breath.  Patient states that she tends to deviate to the left when walking and headache is worse when she looks to the left.   ED Course:  In the emergency department, BP was 192/109, otherwise she was hemodynamically stable.  Work-up in the ED showed hypokalemia, hypomagnesemia, elevated BUN/Creatinine 36/1.96 (this was 1.15 about 3 months ago but has slowly started trending up since 2 months ago-1.37> 1.64).  SARS coronavirus 2 was negative.  She was treated with IV Reglan Benadryl due to the headache, and morphine were also given.  Potassium and magnesium were replenished.  Teleneurology was consulted and was concerned for either a 6th or 3rd nerve palsy as well as possible stroke due to patient's hypertension.  MRI of brain was recommended.  Hospitalist was asked to admit patient for further evaluation and  management.  Review of Systems: Constitutional: Negative for chills and fever.  HENT:  Negative for ear pain and sore throat.   Eyes: Positive for double vision.  Negative for pain and visual disturbance.  Respiratory: Negative for cough, chest tightness and shortness of breath.   Cardiovascular: Negative for chest pain and palpitations.  Gastrointestinal: Negative for abdominal pain and vomiting.  Endocrine: Negative for polyphagia and polyuria.  Genitourinary: Negative for decreased urine volume, dysuria, enuresis, hematuria Musculoskeletal: Negative for arthralgias and back pain.  Skin: Negative for color change and rash.  Allergic/Immunologic: Negative for immunocompromised state.  Neurological: Positive for headache negative for tremors, syncope, speech difficulty, weakness, light-headedness  Hematological: Does not bruise/bleed easily.  All other systems reviewed and are negative    Past Medical History:  Diagnosis Date  . Acute renal failure superimposed on stage 3 chronic kidney disease ([place]) 01/21/2016  . Anemia   . Anxiety   . B12 deficiency   . Cellulitis and abscess    Abdomen and buttocks  . CKD (chronic kidney disease) stage 3, GFR 30-59 ml/min   . Closed fracture of distal end of femur, unspecified fracture morphology, initial encounter ([place])   . Depression   . Diabetic neuropathy (HCC)    feet  . DJD (degenerative joint disease)    Right forminal stenosis C4-5  . Essential hypertension   . Femur fracture, right ([place]) 03/30/2017  . Folliculitis   . GERD (gastroesophageal reflux disease)   . Gout   . Headache(784.0)   . Hiatal hernia   . History of blood transfusion   . History of bronchitis   . History  of cardiac catheterization    No significant CAD 2012  . History of kidney stones    surgery to remove  . Insomnia   . Irritable bowel syndrome   . Mixed hyperlipidemia   . Osteoarthritis   . Palpitations   . Reflux esophagitis   . Tinnitus    Right   . Type 2 diabetes mellitus ([place])    Past Surgical History:  Procedure Laterality Date  . BACK SURGERY    . c-spine surgery     03/2007  . CARDIAC CATHETERIZATION    . CHOLECYSTECTOMY    . COLONOSCOPY  04/21/2011; 05/29/11   6/12: [facility] - ?AVM at IC valve, inflammatory changes at [facility] valve [facility]); 7/12 - [facility]; IC valve erosions, look chronic and probably Crohn's per path  . colonoscopy  2017   [facility]: random biopsies normal. TI normal  . ESOPHAGOGASTRODUODENOSCOPY  05/29/11   normal  . ESOPHAGOGASTRODUODENOSCOPY N/A 01/21/2016   Dr. Michail Sermon: normal  . EYE SURGERY Bilateral    cataracts removed  . FEMUR IM NAIL Right 04/10/2018   Procedure: INTRAMEDULLARY (IM) NAIL FEMORAL;  Surgeon: Paralee Cancel, MD;  Location: [facility];  Service: Orthopedics;  Laterality: Right;  . HARDWARE REMOVAL Right 04/10/2018   Procedure: HARDWARE REMOVAL RIGHT DISTAL FEMUR;  Surgeon: Paralee Cancel, MD;  Location: [facility];  Service: Orthopedics;  Laterality: Right;  . IR FLUORO GUIDE CV LINE RIGHT  04/01/2017  . IR US GUIDE VASC ACCESS RIGHT  04/01/2017  . JOINT REPLACEMENT Right    hip  . OPEN REDUCTION INTERNAL FIXATION (ORIF) DISTAL RADIAL FRACTURE Right 05/28/2019   Procedure: OPEN REDUCTION INTERNAL FIXATION (ORIF) DISTAL RADIAL FRACTURE;  Surgeon: Verner Mould, MD;  Location: [facility];  Service: Orthopedics;  Laterality: Right;  with block 90 minutes  . ORIF FEMUR FRACTURE Right 03/31/2017   Procedure: OPEN REDUCTION INTERNAL FIXATION (ORIF) DISTAL FEMUR FRACTURE;  Surgeon: Paralee Cancel, MD;  Location: [facility];  Service: Orthopedics;  Laterality: Right;  . removal of kidney stone    . Right knee arthroscopic surgery    . TONSILLECTOMY    . TOTAL ABDOMINAL HYSTERECTOMY      Social History:  reports that she has never smoked. She has never used smokeless tobacco. She reports that she does not drink alcohol and does not use drugs.   Allergies  Allergen Reactions  . Duloxetine Hcl  Swelling  . Fluocinolone Swelling  . Acetaminophen Itching and Swelling    Pt states "some swelling"  . Amlodipine Besylate Swelling    Swelling of feet, fluid retention  . Ciprofloxacin Nausea And Vomiting and Swelling    Swelling of face, jaw, and lips  . Hydrocodone-Acetaminophen Itching  . Metformin Other (See Comments)    REACTION: GI UPSET  . Nsaids Other (See Comments)    "HAS BLEEDING ULCERS"  . Quinolones Swelling  . Sulfamethoxazole Swelling    Swelling of feet, legs  . Valsartan Swelling  . Cozaar [Losartan] Swelling and Rash  . Lisinopril Swelling and Rash    Oral swelling, and red streaks on arms/stomach  . Tape Itching and Rash    Paper tape can only be used on this patient    Family History  Problem Relation Age of Onset  . Diabetes Mother   . Colon cancer Brother        POSSIBLY. Patient states diagnosed at age 69, then later states age 30. unclear and limited historian  . Esophageal cancer Neg Hx   .  Rectal cancer Neg Hx   . Stomach cancer Neg Hx      Prior to Admission medications   Medication Sig Start Date End Date Taking? Authorizing Provider  allopurinol (ZYLOPRIM) 100 MG tablet Take 100 mg by mouth daily.   Yes [provider]  Ascorbic Acid (VITAMIN C WITH ROSE HIPS) 500 MG tablet Take 500 mg by mouth every morning.   Yes [provider]  aspirin EC 81 MG tablet Take 81 mg by mouth every morning.    Yes [provider]  atorvastatin (LIPITOR) 80 MG tablet Take 80 mg by mouth every morning.   Yes [provider]  Cholecalciferol (VITAMIN D) 2000 units CAPS Take 2,000 Units by mouth every morning.    Yes [provider]  citalopram (CELEXA) 40 MG tablet Take 40 mg by mouth daily.   Yes [provider]  cyanocobalamin (,VITAMIN B-12,) 1000 MCG/ML injection Inject 1,000 mcg into the muscle every 30 (thirty) days.     Yes [provider]  diazepam (VALIUM) 5 MG tablet Take 1 tablet (5 mg  total) by mouth 2 (two) times daily as needed for anxiety. 04/14/18  Yes Mariel Aloe, MD  esomeprazole (NEXIUM) 40 MG capsule Take 40 mg by mouth daily before breakfast.     Yes [provider]  furosemide (LASIX) 40 MG tablet Take 1 tablet (40 mg total) by mouth daily. Patient taking differently: Take 40 mg by mouth every morning.  01/26/16  Yes Reyne Dumas, MD  hydrALAZINE (APRESOLINE) 25 MG tablet Take 25 mg every 8 hours as needed for systolic blood pressure over 150. 01/19/20  Yes Branch, Alphonse Guild, MD  Insulin Detemir (LEVEMIR FLEXTOUCH) 100 UNIT/ML Pen Inject 80 Units into the skin at bedtime. Patient taking differently: Inject 20 Units into the skin at bedtime. Patient adjust for her sugars 04/03/17  Yes Rosita Fire, MD  Insulin Lispro (HUMALOG KWIKPEN [place]) Inject 20 Units into the skin 3 (three) times daily with meals.    Yes [provider]  metoprolol (LOPRESSOR) 100 MG tablet Take 100 mg by mouth 2 (two) times daily.    Yes [provider]  potassium chloride SA (K-DUR,KLOR-CON) 20 MEQ tablet Take 20 mEq by mouth every morning.    Yes [provider]  Semaglutide,0.25 or 0.5MG /DOS, (OZEMPIC, 0.25 OR 0.5 MG/DOSE,) 2 MG/1.5ML SOPN Inject 2 mg into the skin every Sunday.    Yes [provider]  tiZANidine (ZANAFLEX) 4 MG tablet Take 8 mg by mouth at bedtime. 03/28/20  Yes [provider]  venlafaxine XR (EFFEXOR-XR) 75 MG 24 hr capsule Take 75 mg by mouth daily. 05/05/20  Yes [provider]  zolpidem (AMBIEN) 10 MG tablet Take 10 mg by mouth at bedtime. 05/15/20  Yes [provider]  dicyclomine (BENTYL) 20 MG tablet Take 1 tablet (20 mg total) by mouth 4 (four) times daily -  before meals and at bedtime. Patient not taking: Reported on 05/16/2020 03/24/20   Gatha Mayer, MD  fluconazole (DIFLUCAN) 200 MG tablet Take 200 mg by mouth daily. 10 day course starting on 05/05/2020 Patient not taking: Reported on  05/16/2020 05/05/20   [provider]  ondansetron (ZOFRAN ODT) 4 MG disintegrating tablet Take 1 tablet (4 mg total) by mouth every 8 (eight) hours as needed for nausea or vomiting. Patient not taking: Reported on 05/16/2020 03/02/20   Henderly, Britni A, PA-C  ondansetron (ZOFRAN) 4 MG tablet Take 1 tablet (4 mg  total) by mouth every 8 (eight) hours as needed for nausea or vomiting. Patient not taking: Reported on 05/16/2020 06/10/18   Gatha Mayer, MD  oxyCODONE (ROXICODONE) 5 MG immediate release tablet Take 1 tablet (5 mg total) by mouth every 6 (six) hours as needed for up to 10 doses for severe pain. Patient not taking: Reported on 05/16/2020 02/03/20   Levin Erp, PA  promethazine (PHENERGAN) 25 MG tablet Take 1 tablet (25 mg total) by mouth every 6 (six) hours as needed for nausea or vomiting. Patient not taking: Reported on 05/16/2020 02/03/20   Levin Erp, PA  sucralfate (CARAFATE) 1 g tablet Take 1 tablet (1 g total) by mouth every 6 (six) hours as needed. Patient not taking: Reported on 05/16/2020 02/05/20   Yetta Flock, MD    Physical Exam: BP (!) 163/69   Pulse 76   Temp 98.3 F (36.8 C) (Oral)   Resp 18   Ht 5\' 3"  (1.6 m)   Wt 60.3 kg   LMP  (LMP Unknown)   SpO2 100%   BMI 23.56 kg/m   . General: 69 y.o. year-old female well developed well nourished in no acute distress.  Alert and oriented x3. Marland Kitchen HEENT: Dry mucous membrane.  NCAT . Neck: Supple, trachea medial . Cardiovascular: Regular rate and rhythm with no rubs or gallops.  No thyromegaly or JVD noted.  No lower extremity edema. 2/4 pulses in all 4 extremities. Marland Kitchen Respiratory: Clear to auscultation with no wheezes or rales. Good inspiratory effort. . Abdomen: Soft nontender nondistended with normal bowel sounds x4 quadrants. . Muskuloskeletal: No cyanosis, clubbing or edema noted bilaterally . Neuro: CN II-XII intact, strength, sensation, reflexes . Skin: No ulcerative lesions noted or  rashes . Psychiatry: Judgement and insight appear normal. Mood is appropriate for condition and setting          Labs on Admission:  Basic Metabolic Panel: Recent Labs  Lab 05/16/20 2052  NA 138  K 2.5*  CL 103  CO2 22  GLUCOSE 237*  BUN 36*  CREATININE 1.96*  CALCIUM 9.2  MG 1.4*   Liver Function Tests: No results for input(s): AST, ALT, ALKPHOS, BILITOT, PROT, ALBUMIN in the last 168 hours. No results for input(s): LIPASE, AMYLASE in the last 168 hours. No results for input(s): AMMONIA in the last 168 hours. CBC: Recent Labs  Lab 05/16/20 2052  WBC 6.7  NEUTROABS 3.9  HGB 11.9*  HCT 34.7*  MCV 94.0  PLT 231   Cardiac Enzymes: No results for input(s): CKTOTAL, CKMB, CKMBINDEX, TROPONINI in the last 168 hours.  BNP (last 3 results) No results for input(s): BNP in the last 8760 hours.  ProBNP (last 3 results) No results for input(s): PROBNP in the last 8760 hours.  CBG: No results for input(s): GLUCAP in the last 168 hours.  Radiological Exams on Admission: CT Head Wo Contrast  Result Date: 05/16/2020 CLINICAL DATA:  Headache double vision EXAM: CT HEAD WITHOUT CONTRAST TECHNIQUE: Contiguous axial images were obtained from the base of the skull through the vertex without intravenous contrast. COMPARISON:  CT 03/02/2020 FINDINGS: Brain: No acute territorial infarction, hemorrhage or intracranial mass. Mild atrophy. Stable ventricle size. Vascular: No hyperdense vessels.  Carotid vascular calcification Skull: Normal. Negative for fracture or focal lesion. Sinuses/Orbits: No acute finding. Other: None IMPRESSION: No CT evidence for acute intracranial abnormality. Electronically Signed   By: Donavan Foil M.D.   On: 05/16/2020 21:37    EKG: I independently  viewed the EKG done and my findings are as followed: EKG was not done in the ED  Assessment/Plan Present on Admission: . Headache . Essential hypertension . GERD (gastroesophageal reflux disease) . Gout .  Mixed hyperlipidemia  Principal Problem:   Headache Active Problems:   Essential hypertension   GERD (gastroesophageal reflux disease)   Gout   Mixed hyperlipidemia   Acute kidney injury superimposed on CKD (HCC)   Type 2 diabetes mellitus without complications (HCC)   Hypokalemia   Hypomagnesemia   Dehydration   Hypertensive urgency   Diplopia  Headache and diplopia r/o Acute CVA IV Reglan and Benadryl given in the ED  Continue to monitor and treat accordingly Patient will be admitted to telemetry unit  Bilateral carotid ultrasound in the morning Echocardiogram in the morning MRI of brain without contrast in the morning Continue fall precautions and neuro checks Lipid panel and hemoglobin A1c will be checked Continue PT/OT eval and treat Patient was able to swallow at bedside  Neurology was consulted and we shall await the recommendation.  Hypertensive urgency Essential hypertension This may be a contributing factor to patient's headache, however permissive hypertension will be allowed at this time pending MRI in the morning.  Electrode abnormalities (hypokalemia, hypomagnesemia) possibly due to diuretic use K+ 2.5; Mg 1.4-electrolytes replenished  Dehydration Continue IV hydration  Acute kidney injury on CKD stage IV BUN/Creatinine 36/1.96 (this was 1.15 about 3 months ago but has slowly started trending up since 2 months ago-1.37> 1.64) GFR 26 Renally adjust medications, avoid nephrotoxic agents/dehydration/hypotension  GERD Continue Protonix  Mixed hyperlipidemia Continue Lipitor  Type 2 diabetes mellitus Continue insulin sliding scale and hypoglycemia protocol  Gout Allopurinol will be held at this time due to acute kidney injury  DVT prophylaxis: SCDs  Code Status: DNR  Family Communication: None at bedside  Disposition Plan:  Patient is from:                        home Anticipated DC to:                   SNF or family members home Anticipated  DC date:               2-3 days Anticipated DC barriers:          Patient currently unstable to discharge due to headache and diplopia requiring MRI in the morning  Consults called: Teleneurology by ED team  Admission status: Observation    Bernadette Hoit MD [facility]  If 7PM-7AM, please contact night-coverage [email or web address]  05/17/2020, 3:40 AM

## 2020-05-17 NOTE — ED Notes (Signed)
Discussed with pt DNR vs full code. Pt confirmed twice she wants DNR. States no CPR no lifesupport

## 2020-05-18 ENCOUNTER — Inpatient Hospital Stay (HOSPITAL_COMMUNITY): Payer: Medicare Other

## 2020-05-18 DIAGNOSIS — R51 Headache with orthostatic component, not elsewhere classified: Secondary | ICD-10-CM | POA: Diagnosis not present

## 2020-05-18 LAB — GLUCOSE, CAPILLARY
Glucose-Capillary: 176 mg/dL — ABNORMAL HIGH (ref 70–99)
Glucose-Capillary: 194 mg/dL — ABNORMAL HIGH (ref 70–99)
Glucose-Capillary: 141 mg/dL — ABNORMAL HIGH (ref 70–99)
Glucose-Capillary: 206 mg/dL — ABNORMAL HIGH (ref 70–99)

## 2020-05-18 MED ORDER — ZOLPIDEM TARTRATE 5 MG PO TABS
5.0000 mg | ORAL_TABLET | Freq: Every evening | ORAL | Status: AC | PRN
  Administered 2020-05-18 (×2): 5 mg via ORAL
  Filled 2020-05-18 (×2): qty 1

## 2020-05-18 MED ORDER — TOPIRAMATE 25 MG PO TABS
25.0000 mg | ORAL_TABLET | Freq: Two times a day (BID) | ORAL | Status: DC
  Administered 2020-05-18 – 2020-05-19 (×2): 25 mg via ORAL
  Filled 2020-05-18 (×2): qty 1

## 2020-05-18 MED ORDER — HYDRALAZINE HCL 25 MG PO TABS
50.0000 mg | ORAL_TABLET | Freq: Three times a day (TID) | ORAL | Status: DC
  Administered 2020-05-18 – 2020-05-19 (×4): 50 mg via ORAL
  Filled 2020-05-18 (×4): qty 2

## 2020-05-18 MED ORDER — METHOCARBAMOL 500 MG PO TABS
500.0000 mg | ORAL_TABLET | Freq: Four times a day (QID) | ORAL | Status: DC | PRN
  Administered 2020-05-18: 500 mg via ORAL
  Filled 2020-05-18: qty 1

## 2020-05-18 MED ORDER — INSULIN DETEMIR 100 UNIT/ML ~~LOC~~ SOLN
8.0000 [IU] | Freq: Every day | SUBCUTANEOUS | Status: DC
  Administered 2020-05-18: 8 [IU] via SUBCUTANEOUS
  Filled 2020-05-18 (×2): qty 0.08

## 2020-05-18 MED ORDER — MORPHINE SULFATE (PF) 4 MG/ML IV SOLN
3.0000 mg | Freq: Once | INTRAVENOUS | Status: AC
  Administered 2020-05-18: 3 mg via INTRAVENOUS
  Filled 2020-05-18: qty 1

## 2020-05-18 MED ORDER — HYDRALAZINE HCL 25 MG PO TABS
25.0000 mg | ORAL_TABLET | Freq: Four times a day (QID) | ORAL | Status: DC | PRN
  Administered 2020-05-18: 25 mg via ORAL
  Filled 2020-05-18: qty 1

## 2020-05-18 NOTE — Consult Note (Signed)
[place] A. Merlene Laughter, MD     [email or web address]          Heather [name] is an 69 y.o. female.   ASSESSMENT/PLAN: 1. Severe headache along with other symptoms associated with the accelerated hypertension. I think that her symptoms including headaches are likely due to hypertensive urgency. The symptomatology and imaging does not indicate that the patient has had an acute ischemic stroke or hemorrhage. MRA of the brain is recommended to evaluate for any relevant vascular malformations or aneurysms. The patient will be started on muscle relaxant methocarbamol. The dose of Topamax will be increased. 2. Status post cervical fusion several years ago. It is possible this could contribute to her symptoms although I think this is unlikely given the presentation. 3. Diabetic neuropathy 4. Acute on chronic renal failure 5. Polyarticular osteoarthritis    This is a 69 year old right-handed white female who reports having severe headache which occurred relatively abruptly a couple of days ago. She tells me however that few days before then she was having milder headaches which seems of gotten dramatically worse on the day of presentation. The headaches are bifrontal and periorbital. She reports that it got so severe that she had a couple bouts of nausea and vomiting once. She reports that he got as high as about nine. She reports other associated symptoms including gait ataxia and unsteadiness, periorbital numbness and pins and needle in the neck region. She also reports having binocular horizontal diplopia which lasted several hours. The diplopia has resolved but has blurry vision from time to time. The patient reports that she has not been a patient with frequent episodic headaches. She tells me she may have a headache about once every 5 years. Her blood pressure has been difficult to control and got really worse after being admitted recently. Blood pressure has been as high as  240/100. Patient has had extensive posterior cervical fusion many years ago. She reports the surgery has been very successful and has not had pain in the area until last day or so. She does not report focal neurological symptoms well in the upper or lower extremities. The review of systems otherwise negative.    GENERAL: Patient appears to be in significant discomfort but no acute distress.  HEENT: No scalp tenderness noted. There is a large midline cervical incisional scar extending most of the cervical spine. There is mild increased hypertrophy of the trapezius muscle bilaterally.  ABDOMEN: soft  EXTREMITIES: No edema; status post knee replacement. There is marked arthritic changes noted throughout especially of the knees.  BACK: Normal  SKIN: Normal by inspection.    MENTAL STATUS: Alert and oriented. Speech, language and cognition are generally intact. Judgment and insight normal.   CRANIAL NERVES: Pupils are equal, round and reactive to light and accomodation; extra ocular movements are full, there is no significant nystagmus; visual fields are full; upper and lower facial muscles are normal in strength and symmetric, there is no flattening of the nasolabial folds; tongue is midline; uvula is midline; shoulder elevation is normal.  MOTOR: There is mild deltoid weakness bilaterally. Bulk and tone are normal throughout. The upper and lower extremities otherwise shows normal tone, bulk and strength.  COORDINATION: Left finger to nose is normal, right finger to nose is normal, No rest tremor; no intention tremor; no postural tremor; no bradykinesia.  REFLEXES: Deep tendon reflexes are symmetrical and normal in the upper extremities but diminished in the legs.  SENSATION: Normal to light touch, temperature, and  pain.      Blood pressure (!) 181/72, pulse 78, temperature 98.4 F (36.9 C), resp. rate 17, height 5\' 3"  (1.6 m), weight 60.3 kg, SpO2 100 %.  Past Medical History:   Diagnosis Date  . Acute renal failure superimposed on stage 3 chronic kidney disease ([place]) 01/21/2016  . Anemia   . Anxiety   . B12 deficiency   . Cellulitis and abscess    Abdomen and buttocks  . CKD (chronic kidney disease) stage 3, GFR 30-59 ml/min   . Closed fracture of distal end of femur, unspecified fracture morphology, initial encounter ([place])   . Depression   . Diabetic neuropathy (HCC)    feet  . DJD (degenerative joint disease)    Right forminal stenosis C4-5  . Essential hypertension   . Femur fracture, right ([place]) 03/30/2017  . Folliculitis   . GERD (gastroesophageal reflux disease)   . Gout   . Headache(784.0)   . Hiatal hernia   . History of blood transfusion   . History of bronchitis   . History of cardiac catheterization    No significant CAD 2012  . History of kidney stones    surgery to remove  . Insomnia   . Irritable bowel syndrome   . Mixed hyperlipidemia   . Osteoarthritis   . Palpitations   . Reflux esophagitis   . Tinnitus    Right  . Type 2 diabetes mellitus ([place])     Past Surgical History:  Procedure Laterality Date  . BACK SURGERY    . c-spine surgery     03/2007  . CARDIAC CATHETERIZATION    . CHOLECYSTECTOMY    . COLONOSCOPY  04/21/2011; 05/29/11   6/12: [place] - ?AVM at IC valve, inflammatory changes at [facility]); 7/12 - [facility]; IC valve erosions, look chronic and probably Crohn's per path  . colonoscopy  2017   : random biopsies normal. TI normal  . ESOPHAGOGASTRODUODENOSCOPY  05/29/11   normal  . ESOPHAGOGASTRODUODENOSCOPY N/A 01/21/2016   Dr. Michail Sermon: normal  . EYE SURGERY Bilateral    cataracts removed  . FEMUR IM NAIL Right 04/10/2018   Procedure: INTRAMEDULLARY (IM) NAIL FEMORAL;  Surgeon: Paralee Cancel, MD;  Location: [facility];  Service: Orthopedics;  Laterality: Right;  . HARDWARE REMOVAL Right 04/10/2018   Procedure: HARDWARE REMOVAL RIGHT DISTAL FEMUR;  Surgeon: Paralee Cancel, MD;  Location: [facility];  Service: Orthopedics;  Laterality: Right;  . IR FLUORO GUIDE CV LINE RIGHT  04/01/2017  . IR US GUIDE VASC ACCESS RIGHT  04/01/2017  . JOINT REPLACEMENT Right    hip  . OPEN REDUCTION INTERNAL FIXATION (ORIF) DISTAL RADIAL FRACTURE Right 05/28/2019   Procedure: OPEN REDUCTION INTERNAL FIXATION (ORIF) DISTAL RADIAL FRACTURE;  Surgeon: Verner Mould, MD;  Location: [place];  Service: Orthopedics;  Laterality: Right;  with block 90 minutes  . ORIF FEMUR FRACTURE Right 03/31/2017   Procedure: OPEN REDUCTION INTERNAL FIXATION (ORIF) DISTAL FEMUR FRACTURE;  Surgeon: Paralee Cancel, MD;  Location: [place];  Service: Orthopedics;  Laterality: Right;  . removal of kidney stone    . Right knee arthroscopic surgery    . TONSILLECTOMY    . TOTAL ABDOMINAL HYSTERECTOMY      Family History  Problem Relation Age of Onset  . Diabetes Mother   . Colon cancer Brother        POSSIBLY. Patient states diagnosed at age 38, then later states age 69. unclear and limited historian  .  Esophageal cancer Neg Hx   . Rectal cancer Neg Hx   . Stomach cancer Neg Hx     Social History:  reports that she has never smoked. She has never used smokeless tobacco. She reports that she does not drink alcohol and does not use drugs.  Allergies:  Allergies  Allergen Reactions  . Duloxetine Hcl Swelling  . Fluocinolone Swelling  . Acetaminophen Itching and Swelling    Pt states "some swelling"  . Amlodipine Besylate Swelling    Swelling of feet, fluid retention  . Ciprofloxacin Nausea And Vomiting and Swelling    Swelling of face, jaw, and lips  . Hydrocodone-Acetaminophen Itching  . Metformin Other (See Comments)    REACTION: GI UPSET  . Nsaids Other (See Comments)    "HAS BLEEDING ULCERS"  . Quinolones Swelling  . Sulfamethoxazole Swelling    Swelling of feet, legs  . Valsartan Swelling  . Cozaar [Losartan] Swelling and Rash  . Lisinopril Swelling and Rash    Oral swelling, and red streaks on  arms/stomach  . Tape Itching and Rash    Paper tape can only be used on this patient    Medications: Prior to Admission medications   Medication Sig Start Date End Date Taking? Authorizing Provider  allopurinol (ZYLOPRIM) 100 MG tablet Take 100 mg by mouth daily.   Yes [provider]  Ascorbic Acid (VITAMIN C WITH ROSE HIPS) 500 MG tablet Take 500 mg by mouth every morning.   Yes [provider]  aspirin EC 81 MG tablet Take 81 mg by mouth every morning.    Yes [provider]  atorvastatin (LIPITOR) 80 MG tablet Take 80 mg by mouth every morning.   Yes [provider]  Cholecalciferol (VITAMIN D) 2000 units CAPS Take 2,000 Units by mouth every morning.    Yes [provider]  citalopram (CELEXA) 40 MG tablet Take 40 mg by mouth daily.   Yes [provider]  cyanocobalamin (,VITAMIN B-12,) 1000 MCG/ML injection Inject 1,000 mcg into the muscle every 30 (thirty) days.     Yes [provider]  diazepam (VALIUM) 5 MG tablet Take 1 tablet (5 mg total) by mouth 2 (two) times daily as needed for anxiety. 04/14/18  Yes Mariel Aloe, MD  esomeprazole (NEXIUM) 40 MG capsule Take 40 mg by mouth daily before breakfast.     Yes [provider]  furosemide (LASIX) 40 MG tablet Take 1 tablet (40 mg total) by mouth daily. Patient taking differently: Take 40 mg by mouth every morning.  01/26/16  Yes Reyne Dumas, MD  hydrALAZINE (APRESOLINE) 25 MG tablet Take 25 mg every 8 hours as needed for systolic blood pressure over 150. 01/19/20  Yes Branch, Alphonse Guild, MD  Insulin Detemir (LEVEMIR FLEXTOUCH) 100 UNIT/ML Pen Inject 80 Units into the skin at bedtime. Patient taking differently: Inject 20 Units into the skin at bedtime. Patient adjust for her sugars 04/03/17  Yes Rosita Fire, MD  Insulin Lispro (HUMALOG KWIKPEN [place]) Inject 20 Units into the skin 3 (three) times daily with meals.    Yes [provider]  metoprolol  (LOPRESSOR) 100 MG tablet Take 100 mg by mouth 2 (two) times daily.    Yes [provider]  potassium chloride SA (K-DUR,KLOR-CON) 20 MEQ tablet Take 20 mEq by mouth every morning.    Yes [provider]  Semaglutide,0.25 or 0.5MG /DOS, (OZEMPIC, 0.25 OR 0.5 MG/DOSE,) 2 MG/1.5ML SOPN Inject 2 mg into the skin every  Sunday.    Yes [provider]  tiZANidine (ZANAFLEX) 4 MG tablet Take 8 mg by mouth at bedtime. 03/28/20  Yes [provider]  venlafaxine XR (EFFEXOR-XR) 75 MG 24 hr capsule Take 75 mg by mouth daily. 05/05/20  Yes [provider]  zolpidem (AMBIEN) 10 MG tablet Take 10 mg by mouth at bedtime. 05/15/20  Yes [provider]    Scheduled Meds: . aspirin EC  81 mg Oral q morning - 10a  . atorvastatin  80 mg Oral q morning - 10a  . hydrALAZINE  50 mg Oral Q8H  . insulin aspart  0-5 Units Subcutaneous QHS  . insulin aspart  0-9 Units Subcutaneous TID WC  . metoprolol tartrate  100 mg Oral BID  . omega-3 acid ethyl esters  1 g Oral BID  . pantoprazole  40 mg Oral Daily  . potassium chloride SA  20 mEq Oral q morning - 10a  . topiramate  12.5 mg Oral BID   Continuous Infusions: . sodium chloride 75 mL/hr at 05/17/20 2324   PRN Meds:.labetalol, SUMAtriptan, zolpidem     Results for orders placed or performed during the hospital encounter of 05/16/20 (from the past 48 hour(s))  Basic metabolic panel     Status: Abnormal   Collection Time: 05/16/20  8:52 PM  Result Value Ref Range   Sodium 138 135 - 145 mmol/L   Potassium 2.5 (LL) 3.5 - 5.1 mmol/L    Comment: CRITICAL RESULT CALLED TO, READ BACK BY AND VERIFIED WITH: K NICHOLS,RN@[id number]  05/16/20 MKELLY    Chloride 103 98 - 111 mmol/L   CO2 22 22 - 32 mmol/L   Glucose, Bld 237 (H) 70 - 99 mg/dL    Comment: Glucose reference range applies only to samples taken after fasting for at least 8 hours.   BUN 36 (H) 8 - 23 mg/dL   Creatinine, Ser 1.96 (H) 0.44 - 1.00 mg/dL   Calcium  9.2 8.9 - 10.3 mg/dL   GFR calc non Af Amer 26 (L) >60 mL/min   GFR calc Af Amer 30 (L) >60 mL/min   Anion gap 13 5 - 15    Comment: Performed at [facility], [address], [place], [place] [address]  CBC with Differential     Status: Abnormal   Collection Time: 05/16/20  8:52 PM  Result Value Ref Range   WBC 6.7 4.0 - 10.5 K/uL   RBC 3.69 (L) 3.87 - 5.11 MIL/uL   Hemoglobin 11.9 (L) 12.0 - 15.0 g/dL   HCT 34.7 (L) 36 - 46 %   MCV 94.0 80.0 - 100.0 fL   MCH 32.2 26.0 - 34.0 pg   MCHC 34.3 30.0 - 36.0 g/dL   RDW 15.1 11.5 - 15.5 %   Platelets 231 150 - 400 K/uL   nRBC 0.0 0.0 - 0.2 %   Neutrophils Relative % 57 %   Neutro Abs 3.9 1.7 - 7.7 K/uL   Lymphocytes Relative 31 %   Lymphs Abs 2.1 0.7 - 4.0 K/uL   Monocytes Relative 8 %   Monocytes Absolute 0.5 0 - 1 K/uL   Eosinophils Relative 2 %   Eosinophils Absolute 0.2 0 - 0 K/uL   Basophils Relative 1 %   Basophils Absolute 0.1 0 - 0 K/uL   Immature Granulocytes 1 %   Abs Immature Granulocytes 0.06 0.00 - 0.07 K/uL    Comment: Performed at [facility], [address], [place],  [address]  Magnesium  Status: Abnormal   Collection Time: 05/16/20  8:52 PM  Result Value Ref Range   Magnesium 1.4 (L) 1.7 - 2.4 mg/dL    Comment: Performed at [facility], [address], [place], [place] [address]  SARS Coronavirus 2 by RT PCR (hospital order, performed in [facility] hospital lab) Nasopharyngeal Nasopharyngeal Swab     Status: None   Collection Time: 05/16/20 11:00 PM   Specimen: Nasopharyngeal Swab  Result Value Ref Range   SARS Coronavirus 2 NEGATIVE NEGATIVE    Comment: (NOTE) SARS-CoV-2 target nucleic acids are NOT DETECTED.  The SARS-CoV-2 RNA is generally detectable in upper and lower respiratory specimens during the acute phase of infection. The lowest concentration of SARS-CoV-2 viral copies this assay can detect is 250 copies / mL. A negative result does not preclude SARS-CoV-2 infection and should not be  used as the sole basis for treatment or other patient management decisions.  A negative result may occur with improper specimen collection / handling, submission of specimen other than nasopharyngeal swab, presence of viral mutation(s) within the areas targeted by this assay, and inadequate number of viral copies (<250 copies / mL). A negative result must be combined with clinical observations, patient history, and epidemiological information.  Fact Sheet for Patients:   StrictlyIdeas.no  Fact Sheet for Healthcare Providers: BankingDealers.co.za  This test is not yet approved or  cleared by the [place] FDA and has been authorized for detection and/or diagnosis of SARS-CoV-2 by FDA under an Emergency Use Authorization (EUA).  This EUA will remain in effect (meaning this test can be used) for the duration of the COVID-19 declaration under Section 564(b)(1) of the Act, 21 U.S.C. section 360bbb-3(b)(1), unless the authorization is terminated or revoked sooner.  Performed at [facility], [address], [place], [place] [address]   HIV Antibody (routine testing w rflx)     Status: None   Collection Time: 05/17/20  4:45 AM  Result Value Ref Range   HIV Screen 4th Generation wRfx Non Reactive Non Reactive    Comment: Performed at [facility], [place] [address], [place], [place] [address]  Comprehensive metabolic panel     Status: Abnormal   Collection Time: 05/17/20  4:45 AM  Result Value Ref Range   Sodium 138 135 - 145 mmol/L   Potassium 3.7 3.5 - 5.1 mmol/L    Comment: DELTA CHECK NOTED   Chloride 107 98 - 111 mmol/L   CO2 20 (L) 22 - 32 mmol/L   Glucose, Bld 158 (H) 70 - 99 mg/dL    Comment: Glucose reference range applies only to samples taken after fasting for at least 8 hours.   BUN 31 (H) 8 - 23 mg/dL   Creatinine, Ser 1.65 (H) 0.44 - 1.00 mg/dL   Calcium 8.6 (L) 8.9 - 10.3 mg/dL   Total Protein 7.1 6.5 - 8.1 g/dL    Albumin 4.0 3.5 - 5.0 g/dL   AST 18 15 - 41 U/L   ALT 15 0 - 44 U/L   Alkaline Phosphatase 57 38 - 126 U/L   Total Bilirubin 0.4 0.3 - 1.2 mg/dL   GFR calc non Af Amer 32 (L) >60 mL/min   GFR calc Af Amer 37 (L) >60 mL/min   Anion gap 11 5 - 15    Comment: Performed at [facility], [address] [place], [place] [address]  CBC     Status: Abnormal   Collection Time: 05/17/20  4:45 AM  Result  Value Ref Range   WBC 7.6 4.0 - 10.5 K/uL   RBC 3.56 (L) 3.87 - 5.11 MIL/uL   Hemoglobin 11.4 (L) 12.0 - 15.0 g/dL   HCT 34.7 (L) 36 - 46 %   MCV 97.5 80.0 - 100.0 fL   MCH 32.0 26.0 - 34.0 pg   MCHC 32.9 30.0 - 36.0 g/dL   RDW 14.9 11.5 - 15.5 %   Platelets 235 150 - 400 K/uL   nRBC 0.0 0.0 - 0.2 %    Comment: Performed at [facility] [address], [place], [place] [address]  Protime-INR     Status: None   Collection Time: 05/17/20  4:45 AM  Result Value Ref Range   Prothrombin Time 13.0 11.4 - 15.2 seconds   INR 1.0 0.8 - 1.2    Comment: (NOTE) INR goal varies based on device and disease states. Performed at [facility], [address] [place], [place] [address]   APTT     Status: None   Collection Time: 05/17/20  4:45 AM  Result Value Ref Range   aPTT 28 24 - 36 seconds    Comment: Performed at [facility], [address], [place], [place] [address]  Magnesium     Status: None   Collection Time: 05/17/20  4:45 AM  Result Value Ref Range   Magnesium 1.8 1.7 - 2.4 mg/dL    Comment: Performed at [facility], [address], [place], [place] [address]  Phosphorus     Status: Abnormal   Collection Time: 05/17/20  4:45 AM  Result Value Ref Range   Phosphorus 2.2 (L) 2.5 - 4.6 mg/dL    Comment: Performed at [facility], [address], [place], [place] [address]  Hemoglobin A1c     Status: Abnormal   Collection Time: 05/17/20  4:45 AM  Result Value Ref Range   Hgb A1c MFr Bld 6.9 (H) 4.8 - 5.6 %    Comment: (NOTE) Pre diabetes:          5.7%-6.4%  Diabetes:               >6.4%  Glycemic control for   <7.0% adults with diabetes    Mean Plasma Glucose 151.33 mg/dL    Comment: Performed at [place] [address] [place],  [address]  Lipid panel     Status: Abnormal   Collection Time: 05/17/20  4:45 AM  Result Value Ref Range   Cholesterol 320 (H) 0 - 200 mg/dL   Triglycerides 629 (H) <150 mg/dL   HDL 30 (L) >40 mg/dL   Total CHOL/HDL Ratio 10.7 RATIO   VLDL UNABLE TO CALCULATE IF TRIGLYCERIDE OVER 400 mg/dL 0 - 40 mg/dL   LDL Cholesterol UNABLE TO CALCULATE IF TRIGLYCERIDE OVER 400 mg/dL 0 - 99 mg/dL    Comment:        Total Cholesterol/HDL:CHD Risk Coronary Heart Disease Risk Table                     Men   Women  1/2 Average Risk   3.4   3.3  Average Risk       5.0   4.4  2 X Average Risk   9.6   7.1  3 X Average Risk  23.4   11.0        Use the calculated Patient Ratio above and the CHD Risk Table to determine the patient's CHD Risk.  ATP III CLASSIFICATION (LDL):  <100     mg/dL   Optimal  100-129  mg/dL   Near or Above                    Optimal  130-159  mg/dL   Borderline  160-189  mg/dL   High  >190     mg/dL   Very High Performed at [facility] [place], [place] [address]   LDL cholesterol, direct     Status: None   Collection Time: 05/17/20  4:45 AM  Result Value Ref Range   Direct LDL 96.9 0 - 99 mg/dL    Comment: Performed at [facility] [address], [place], [place] [address]  Glucose, capillary     Status: Abnormal   Collection Time: 05/17/20  7:24 AM  Result Value Ref Range   Glucose-Capillary 179 (H) 70 - 99 mg/dL    Comment: Glucose reference range applies only to samples taken after fasting for at least 8 hours.  Vitamin B12     Status: Abnormal   Collection Time: 05/17/20  8:20 AM  Result Value Ref Range   Vitamin B-12 1,034 (H) 180 - 914 pg/mL    Comment: (NOTE) This assay is not validated for testing neonatal or myeloproliferative syndrome specimens for  Vitamin B12 levels. Performed at [facility], [address] [place], [place] [address]   Glucose, capillary     Status: Abnormal   Collection Time: 05/17/20 12:57 PM  Result Value Ref Range   Glucose-Capillary 151 (H) 70 - 99 mg/dL    Comment: Glucose reference range applies only to samples taken after fasting for at least 8 hours.   Comment 1 Notify RN    Comment 2 Document in Chart   Glucose, capillary     Status: Abnormal   Collection Time: 05/17/20  4:04 PM  Result Value Ref Range   Glucose-Capillary 142 (H) 70 - 99 mg/dL    Comment: Glucose reference range applies only to samples taken after fasting for at least 8 hours.   Comment 1 Notify RN    Comment 2 Document in Chart   Glucose, capillary     Status: Abnormal   Collection Time: 05/17/20  8:44 PM  Result Value Ref Range   Glucose-Capillary 221 (H) 70 - 99 mg/dL    Comment: Glucose reference range applies only to samples taken after fasting for at least 8 hours.  Glucose, capillary     Status: Abnormal   Collection Time: 05/18/20  7:22 AM  Result Value Ref Range   Glucose-Capillary 176 (H) 70 - 99 mg/dL    Comment: Glucose reference range applies only to samples taken after fasting for at least 8 hours.    Studies/Results:  BRAIN MRI FINDINGS: Brain:  Mild generalized parenchymal atrophy.  Mild scattered T2/FLAIR hyperintensity within the cerebral white matter and pons is nonspecific, but consistent with chronic small vessel ischemic disease. Findings have progressed as compared to the prior MRI of 01/13/2015.  There is a dural-based extra-axial mass overlying the anterior right frontal lobe which measures 6 mm (series 15, image 17). In retrospect, this was present on the prior MRI, and has slightly increased in size since that time.  There is no acute infarct.  No evidence of intracranial mass.  No chronic intracranial blood products.  No extra-axial fluid collection.  No midline  shift.  Vascular: Expected proximal arterial flow voids.  Skull and upper cervical  spine: No focal marrow lesion. Susceptibility artifact arising from cervical spinal fusion hardware.  Sinuses/Orbits: Visualized orbits show no acute finding. Mild ethmoid sinus mucosal thickening. Small bilateral mastoid effusions.  IMPRESSION: No evidence of acute intracranial abnormality, including acute infarction.  Mild chronic small vessel ischemic changes within the cerebral white matter and pons, progressed as compared to the prior MRI of 01/13/2015.  6 mm dural-based mass overlying the anterior right frontal lobe, slightly increased in size as compared to the prior MRI and likely reflecting a small incidental meningioma.  Mild generalized parenchymal atrophy.  Mild ethmoid sinus mucosal thickening.  Small bilateral mastoid effusions.    The brain MRI is reviewed in person.  No acute changes are noted on DWI.  There is moderate deep white matter increase signal seen on consistent with chronic microvascular changes.  There was moderate global atrophy.  There is a tiny hyperintense lesion in the subdural space and hanging from the dura consistent with a meningioma.  This involves the right frontal area.  No evidence of hemorrhage or encephalomalacia is noted.  Yeraldin Litzenberger A. Merlene Laughter, M.D.  Diplomate, Tax adviser of Psychiatry and Neurology ( Neurology). 05/18/2020, 7:32 AM

## 2020-05-18 NOTE — Progress Notes (Signed)
PROGRESS NOTE    Heather [name]  EVO:[id number] DOB: 1951/06/19 DOA: 05/16/2020 PCP: [name], MD    Chief Complaint  Patient presents with  . Headache    Brief Narrative:  As per H&P written by Dr. Rhys Martini on 05/17/2020 Heather [name] is a 69 y.o. female with medical history significant for DM2, CAD,HTN, Gout, Backpain s/p posterior spinal fusion of L3-L5on 09/14/2019 at [place] who presents to the emergency department due to onset of headache and double vision which started around 9 AM yesterday.  Headache started while sitting on the sofa, pain was in the frontal area and this migrated to the back of the head and extended to neck and bilateral shoulders and this was associated with the development of double vision.  She denies any aggravating/elevating factor, patient denies tobacco use, alcohol or any recreational drug use.  She denied drinking coffee prior to onset of symptoms. She states that she checked her blood pressure and it was 240/100, she endorsed nausea and nonbloody vomiting x1, but denies any extremity weakness, numbness, tingling, chest pain or shortness of breath.  Patient states that she tends to deviate to the left when walking and headache is worse when she looks to the left.   ED Course:  In the emergency department, BP was 192/109, otherwise she was hemodynamically stable.  Work-up in the ED showed hypokalemia, hypomagnesemia, elevated BUN/Creatinine 36/1.96 (this was 1.15 about 3 months ago but has slowly started trending up since 2 months ago-1.37> 1.64).  SARS coronavirus 2 was negative.  She was treated with IV Reglan Benadryl due to the headache, and morphine were also given.  Potassium and magnesium were replenished.  Teleneurology was consulted and was concerned for either a 6th or 3rd nerve palsy as well as possible stroke due to patient's hypertension.  MRI of brain was recommended.  Hospitalist was asked to admit patient for further  evaluation and management.  Assessment & Plan: 1-uncontrolled headache/TIA -Stroke work-up negative for acute ischemic or normalities -Patient continued to have uncontrolled headaches -Carotid Dopplers and 2D echo not revealing significant abnormalities. -Continue aspirin and statins for secondary prevention -Per neurology recommendations we will check an MRI -Continue analgesic management and preventive therapy for headaches.  2-hypertensive urgency -Continue slowly to control patient blood pressure as she was allowed permissive hypertension in the setting of acute ischemic concerns. -BP is better currently. -Continue hydralazine, metoprolol and as needed labetalol.  3-gastroesophageal reflux disease -Continue PPI.  4-history of hyperlipidemia -Continue statins.  5-hypokalemia/hypomagnesemia -Most likely associated with chronic diuretic usage. -Continue to follow electrolytes trend and further replete as needed -Currently stabilized.  6-brain mass -Has been seen since 2016 -Sound slightly enlargement appreciated on repeat MRI during this admission without mass-effect. -Per neurology recommendation MRA will be checked. -Further treatment decisions to be determined.  7-acute kidney injury on chronic kidney disease stage IV -Creatinine at baseline around 1.15-1.37 -Creatinine peaked at 1.96 on presentation. -Continue to renally adjust her medications, minimize the use of nephrotoxic agents and avoid hypotension. -IV fluids have been provided -Creatinine currently 1.6; continue to follow trend.  8-type 2 diabetes mellitus with nephropathy -Continue to follow sliding scale insulin -Follow CBGs and adjust hypoglycemic regimen as needed.  9-history of gout -No signs or symptoms of acute flare.   DVT prophylaxis: SCDs. Code Status: DNR Family Communication: No family member at bedside. Disposition:   Status is: Inpatient  Dispo: The patient is from: Home  Anticipated d/c is to: Home              Anticipated d/c date is: 1-2 days.              Patient currently No medically stable for discharge currently, still experiencing significant ongoing debilitating headaches and having uncontrolled blood pressure.  Following neurology recommendations MRI of the brain will be done.  Continue analgesic management and further adjustment antihypertensive agents.       Consultants:   Neurology service (Dr. Merlene Laughter).   Procedures: See below for x-ray reports 2D echo: Preserved ejection fraction, no thrombi or significant valvular disorder.   Antimicrobials:  None   Subjective: No chest pain, no shortness of breath, no nausea, no vomiting.  Still complaining of significant headache and not feeling well.  Blood pressure still uncontrolled.  Objective: Vitals:   05/18/20 0050 05/18/20 0158 05/18/20 0454 05/18/20 1435  BP: (!) 216/79 (!) 207/74 (!) 181/72 (!) 181/65  Pulse: 84 87 78 86  Resp: 20 17 17 19   Temp:   98.4 F (36.9 C) 98.6 F (37 C)  TempSrc:    Oral  SpO2: 99% 100% 100% 98%  Weight:      Height:        Intake/Output Summary (Last 24 hours) at 05/18/2020 1656 Last data filed at 05/18/2020 1300 Gross per 24 hour  Intake 480 ml  Output --  Net 480 ml   Filed Weights   05/16/20 1907  Weight: 60.3 kg    Examination:  General exam: Appears in mild distress secondary to uncontrolled headaches.  Patient reports no chest pain, no nausea, no vomiting and no focal weaknesses.  Reports improvement in her vision disturbances. Respiratory system: Clear to auscultation. Respiratory effort normal. Cardiovascular system: S1 & S2 heard, RRR. No JVD, murmurs, rubs, gallops or clicks. No pedal edema. Gastrointestinal system: Abdomen is nondistended, soft and nontender. No organomegaly or masses felt. Normal bowel sounds heard. Central nervous system: No focal neurological deficits. Extremities: Symmetric 5 x 5 power. Skin: No rashes,  no petechiae. Psychiatry: Judgement and insight appear normal. Mood & affect appropriate.     Data Reviewed: I have personally reviewed following labs and imaging studies  CBC: Recent Labs  Lab 05/16/20 2052 05/17/20 0445  WBC 6.7 7.6  NEUTROABS 3.9  --   HGB 11.9* 11.4*  HCT 34.7* 34.7*  MCV 94.0 97.5  PLT 231 702    Basic Metabolic Panel: Recent Labs  Lab 05/16/20 2052 05/17/20 0445  NA 138 138  K 2.5* 3.7  CL 103 107  CO2 22 20*  GLUCOSE 237* 158*  BUN 36* 31*  CREATININE 1.96* 1.65*  CALCIUM 9.2 8.6*  MG 1.4* 1.8  PHOS  --  2.2*    GFR: Estimated Creatinine Clearance: 27.4 mL/min (A) (by C-G formula based on SCr of 1.65 mg/dL (H)).  Liver Function Tests: Recent Labs  Lab 05/17/20 0445  AST 18  ALT 15  ALKPHOS 57  BILITOT 0.4  PROT 7.1  ALBUMIN 4.0    CBG: Recent Labs  Lab 05/17/20 1604 05/17/20 2044 05/18/20 0722 05/18/20 1113 05/18/20 1606  GLUCAP 142* 221* 176* 194* 141*     Recent Results (from the past 240 hour(s))  SARS Coronavirus 2 by RT PCR (hospital order, performed in [facility] hospital lab) Nasopharyngeal Nasopharyngeal Swab     Status: None   Collection Time: 05/16/20 11:00 PM   Specimen: Nasopharyngeal Swab  Result Value Ref Range Status   SARS  Coronavirus 2 NEGATIVE NEGATIVE Final    Comment: (NOTE) SARS-CoV-2 target nucleic acids are NOT DETECTED.  The SARS-CoV-2 RNA is generally detectable in upper and lower respiratory specimens during the acute phase of infection. The lowest concentration of SARS-CoV-2 viral copies this assay can detect is 250 copies / mL. A negative result does not preclude SARS-CoV-2 infection and should not be used as the sole basis for treatment or other patient management decisions.  A negative result may occur with improper specimen collection / handling, submission of specimen other than nasopharyngeal swab, presence of viral mutation(s) within the areas targeted by this assay, and  inadequate number of viral copies (<250 copies / mL). A negative result must be combined with clinical observations, patient history, and epidemiological information.  Fact Sheet for Patients:   StrictlyIdeas.no  Fact Sheet for Healthcare Providers: BankingDealers.co.za  This test is not yet approved or  cleared by the [place] FDA and has been authorized for detection and/or diagnosis of SARS-CoV-2 by FDA under an Emergency Use Authorization (EUA).  This EUA will remain in effect (meaning this test can be used) for the duration of the COVID-19 declaration under Section 564(b)(1) of the Act, 21 U.S.C. section 360bbb-3(b)(1), unless the authorization is terminated or revoked sooner.  Performed at [facility], [address], [place], [place] [address]      Radiology Studies: CT Head Wo Contrast  Result Date: 05/16/2020 CLINICAL DATA:  Headache double vision EXAM: CT HEAD WITHOUT CONTRAST TECHNIQUE: Contiguous axial images were obtained from the base of the skull through the vertex without intravenous contrast. COMPARISON:  CT 03/02/2020 FINDINGS: Brain: No acute territorial infarction, hemorrhage or intracranial mass. Mild atrophy. Stable ventricle size. Vascular: No hyperdense vessels.  Carotid vascular calcification Skull: Normal. Negative for fracture or focal lesion. Sinuses/Orbits: No acute finding. Other: None IMPRESSION: No CT evidence for acute intracranial abnormality. Electronically Signed   By: Donavan Foil M.D.   On: 05/16/2020 21:37   MR ANGIO HEAD WO CONTRAST  Result Date: 05/18/2020 CLINICAL DATA:  Double vision EXAM: MRA HEAD WITHOUT CONTRAST TECHNIQUE: Angiographic images of the Circle of Willis were obtained using MRA technique without intravenous contrast. COMPARISON:  None. FINDINGS: Intracranial internal carotid arteries are patent with atherosclerotic irregularity. Middle and anterior cerebral arteries are patent.  Visualized intracranial vertebral arteries, basilar artery, posterior cerebral arteries are patent. A left posterior communicating arteries present, which appears to supply the left posterior cerebral artery. There is suggestion of a stenotic or diminutive left P1 PCA segment. There is no aneurysm. IMPRESSION: No proximal intracranial vessel occlusion. Electronically Signed   By: Macy Mis M.D.   On: 05/18/2020 15:07   MR BRAIN WO CONTRAST  Result Date: 05/17/2020 CLINICAL DATA:  Neuro deficit, acute, stroke suspected. Additional provided: Patient reports headache and double vision, emesis, diarrhea for 2 days. EXAM: MRI HEAD WITHOUT CONTRAST TECHNIQUE: Multiplanar, multiecho pulse sequences of the brain and surrounding structures were obtained without intravenous contrast. COMPARISON:  Prior head CT examinations 05/16/2020 and earlier, brain MRI 01/13/2015. FINDINGS: Brain: Mild generalized parenchymal atrophy. Mild scattered T2/FLAIR hyperintensity within the cerebral white matter and pons is nonspecific, but consistent with chronic small vessel ischemic disease. Findings have progressed as compared to the prior MRI of 01/13/2015. There is a dural-based extra-axial mass overlying the anterior right frontal lobe which measures 6 mm (series 15, image 17). In retrospect, this was present on the prior MRI, and has slightly increased in size since that time. There is no acute  infarct. No evidence of intracranial mass. No chronic intracranial blood products. No extra-axial fluid collection. No midline shift. Vascular: Expected proximal arterial flow voids. Skull and upper cervical spine: No focal marrow lesion. Susceptibility artifact arising from cervical spinal fusion hardware. Sinuses/Orbits: Visualized orbits show no acute finding. Mild ethmoid sinus mucosal thickening. Small bilateral mastoid effusions. IMPRESSION: No evidence of acute intracranial abnormality, including acute infarction. Mild chronic  small vessel ischemic changes within the cerebral white matter and pons, progressed as compared to the prior MRI of 01/13/2015. 6 mm dural-based mass overlying the anterior right frontal lobe, slightly increased in size as compared to the prior MRI and likely reflecting a small incidental meningioma. Mild generalized parenchymal atrophy. Mild ethmoid sinus mucosal thickening. Small bilateral mastoid effusions. Electronically Signed   By: Kellie Simmering DO   On: 05/17/2020 11:39   US Carotid Bilateral  Result Date: 05/17/2020 CLINICAL DATA:  Acute ischemic stroke. Double vision. History of hypertension and diabetes. EXAM: BILATERAL CAROTID DUPLEX ULTRASOUND TECHNIQUE: Pearline Cables scale imaging, color Doppler and duplex ultrasound were performed of bilateral carotid and vertebral arteries in the neck. COMPARISON:  None. FINDINGS: Criteria: Quantification of carotid stenosis is based on velocity parameters that correlate the residual internal carotid diameter with NASCET-based stenosis levels, using the diameter of the distal internal carotid lumen as the denominator for stenosis measurement. The following velocity measurements were obtained: RIGHT ICA: 105/76 cm/sec CCA: 67/67 cm/sec SYSTOLIC ICA/CCA RATIO:  1.4 ECA: 126 cm/sec LEFT ICA: 72/78 cm/sec CCA: 20/94 cm/sec SYSTOLIC ICA/CCA RATIO:  0.9 ECA: 116 cm/sec RIGHT CAROTID ARTERY: There is a moderate amount of eccentric echogenic partially shadowing plaque within the right carotid bulb (images 14 and 16), extending to involve the origin and proximal aspects of the right internal carotid artery (image 26), not resulting in elevated peak systolic velocities within the interrogated course of the right internal carotid artery to suggest a hemodynamically significant stenosis. RIGHT VERTEBRAL ARTERY:  Antegrade flow LEFT CAROTID ARTERY: There is a minimal amount of eccentric echogenic plaque within the distal aspect the left common carotid artery (image 47. There is a  moderate amount of eccentric echogenic plaque within the left carotid bulb (images 50 and 52), extending to involve the origin and proximal aspects of the left internal carotid artery (image 61), not resulting in elevated peak systolic velocities within the interrogated course of the left internal carotid artery to suggest a hemodynamically significant stenosis. LEFT VERTEBRAL ARTERY:  Antegrade flow IMPRESSION: Moderate amount of bilateral atherosclerotic plaque, not resulting in a hemodynamically significant stenosis within either internal carotid artery. Electronically Signed   By: Sandi Mariscal M.D.   On: 05/17/2020 12:49   ECHOCARDIOGRAM COMPLETE  Result Date: 05/17/2020    ECHOCARDIOGRAM REPORT   Patient Name:   Heather [name] Date of Exam: 05/17/2020 Medical Rec #:  [id number]           Height:       63.0 in Accession #:    [id number]          Weight:       133.0 lb Date of Birth:  [birth year]           BSA:          1.626 m Patient Age:    49 years            BP:           178/71 mmHg Patient Gender: F  HR:           66 bpm. Exam Location:  [facility] Procedure: 2D Echo, Cardiac Doppler and Color Doppler Indications:    Stroke 434.91 / I163.9  History:        Patient has prior history of Echocardiogram examinations, most                 recent 12/26/2016. Risk Factors:Hypertension, Diabetes and                 Dyslipidemia. TRICUSPID REGURGITATION, MODERATE, CKD (chronic                 kidney disease) stage 3, GFR 30-59 ml/min (From Hx).  Sonographer:    Alvino Chapel RCS Referring Phys: [id number] OLADAPO ADEFESO IMPRESSIONS  1. Left ventricular ejection fraction, by estimation, is 60 to 65%. The left ventricle has normal function. The left ventricle has no regional wall motion abnormalities. There is mild left ventricular hypertrophy. Left ventricular diastolic parameters are consistent with Grade I diastolic dysfunction (impaired relaxation).  2. Right ventricular systolic function is  normal. The right ventricular size is normal. There is normal pulmonary artery systolic pressure.  3. The mitral valve is normal in structure. No evidence of mitral valve regurgitation. No evidence of mitral stenosis.  4. The aortic valve is tricuspid. Aortic valve regurgitation is not visualized. No aortic stenosis is present.  5. The inferior vena cava is normal in size with greater than 50% respiratory variability, suggesting right atrial pressure of 3 mmHg. FINDINGS  Left Ventricle: Left ventricular ejection fraction, by estimation, is 60 to 65%. The left ventricle has normal function. The left ventricle has no regional wall motion abnormalities. The left ventricular internal cavity size was normal in size. There is  mild left ventricular hypertrophy. Left ventricular diastolic parameters are consistent with Grade I diastolic dysfunction (impaired relaxation). Normal left ventricular filling pressure. Right Ventricle: The right ventricular size is normal. No increase in right ventricular wall thickness. Right ventricular systolic function is normal. There is normal pulmonary artery systolic pressure. The tricuspid regurgitant velocity is 2.32 m/s, and  with an assumed right atrial pressure of 3 mmHg, the estimated right ventricular systolic pressure is 17.6 mmHg. Left Atrium: Left atrial size was normal in size. Right Atrium: Right atrial size was normal in size. Pericardium: There is no evidence of pericardial effusion. Mitral Valve: The mitral valve is normal in structure. No evidence of mitral valve regurgitation. No evidence of mitral valve stenosis. Tricuspid Valve: The tricuspid valve is normal in structure. Tricuspid valve regurgitation is not demonstrated. No evidence of tricuspid stenosis. Aortic Valve: The aortic valve is tricuspid. . There is mild thickening and mild calcification of the aortic valve. Aortic valve regurgitation is not visualized. No aortic stenosis is present. Mild aortic valve  annular calcification. There is mild thickening of the aortic valve. There is mild calcification of the aortic valve. Aortic valve mean gradient measures 6.1 mmHg. Aortic valve peak gradient measures 10.8 mmHg. Aortic valve area, by VTI measures 1.77 cm. Pulmonic Valve: The pulmonic valve was not well visualized. Pulmonic valve regurgitation is not visualized. No evidence of pulmonic stenosis. Aorta: The aortic root is normal in size and structure. Pulmonary Artery: Indeterminant PASP, inadequate TR jet. Venous: The inferior vena cava is normal in size with greater than 50% respiratory variability, suggesting right atrial pressure of 3 mmHg. IAS/Shunts: No atrial level shunt detected by color flow Doppler.  LEFT VENTRICLE PLAX 2D LVIDd:  4.40 cm  Diastology LVIDs:         2.89 cm  LV e' lateral:   7.51 cm/s LV PW:         0.99 cm  LV E/e' lateral: 11.1 LV IVS:        1.00 cm  LV e' medial:    5.66 cm/s LVOT diam:     1.80 cm  LV E/e' medial:  14.8 LV SV:         62 LV SV Index:   38 LVOT Area:     2.54 cm  RIGHT VENTRICLE RV S prime:     9.36 cm/s TAPSE (M-mode): 1.6 cm LEFT ATRIUM             Index       RIGHT ATRIUM           Index LA diam:        3.60 cm 2.21 cm/m  RA Area:     13.30 cm LA Vol (A2C):   47.8 ml 29.40 ml/m RA Volume:   33.20 ml  20.42 ml/m LA Vol (A4C):   48.2 ml 29.65 ml/m LA Biplane Vol: 50.1 ml 30.82 ml/m  AORTIC VALVE AV Area (Vmax):    1.72 cm AV Area (Vmean):   1.48 cm AV Area (VTI):     1.77 cm AV Vmax:           163.97 cm/s AV Vmean:          116.782 cm/s AV VTI:            0.350 m AV Peak Grad:      10.8 mmHg AV Mean Grad:      6.1 mmHg LVOT Vmax:         111.00 cm/s LVOT Vmean:        67.900 cm/s LVOT VTI:          0.243 m LVOT/AV VTI ratio: 0.70  AORTA Ao Root diam: 2.70 cm MITRAL VALVE               TRICUSPID VALVE MV Area (PHT): 2.82 cm    TR Peak grad:   21.5 mmHg MV Decel Time: 269 msec    TR Vmax:        232.00 cm/s MV E velocity: 83.60 cm/s MV A velocity: 98.50  cm/s  SHUNTS MV E/A ratio:  0.85        Systemic VTI:  0.24 m                            Systemic Diam: 1.80 cm Heather Dolly MD Electronically signed by Heather Dolly MD Signature Date/Time: 05/17/2020/12:50:47 PM    Final    Scheduled Meds: . aspirin EC  81 mg Oral q morning - 10a  . atorvastatin  80 mg Oral q morning - 10a  . hydrALAZINE  50 mg Oral Q8H  . insulin aspart  0-5 Units Subcutaneous QHS  . insulin aspart  0-9 Units Subcutaneous TID WC  . metoprolol tartrate  100 mg Oral BID  . omega-3 acid ethyl esters  1 g Oral BID  . pantoprazole  40 mg Oral Daily  . potassium chloride SA  20 mEq Oral q morning - 10a  . topiramate  25 mg Oral BID   Continuous Infusions: . sodium chloride 75 mL/hr at 05/18/20 1646     LOS: 1 day    Time spent: 35 minutes  Barton Dubois, MD [facility]   To contact the attending provider between 7A-7P or the covering provider during after hours 7P-7A, please log into the web site [email or web address] and access using universal [place] password for that web site. If you do not have the password, please call the hospital operator.  05/18/2020, 4:56 PM

## 2020-05-19 DIAGNOSIS — E1121 Type 2 diabetes mellitus with diabetic nephropathy: Secondary | ICD-10-CM

## 2020-05-19 LAB — GLUCOSE, CAPILLARY
Glucose-Capillary: 170 mg/dL — ABNORMAL HIGH (ref 70–99)
Glucose-Capillary: 267 mg/dL — ABNORMAL HIGH (ref 70–99)

## 2020-05-19 LAB — BASIC METABOLIC PANEL
Anion gap: 9 (ref 5–15)
BUN: 19 mg/dL (ref 8–23)
CO2: 19 mmol/L — ABNORMAL LOW (ref 22–32)
Calcium: 8.9 mg/dL (ref 8.9–10.3)
Chloride: 111 mmol/L (ref 98–111)
Creatinine, Ser: 1.17 mg/dL — ABNORMAL HIGH (ref 0.44–1.00)
GFR calc Af Amer: 56 mL/min — ABNORMAL LOW (ref >60)
GFR calc non Af Amer: 48 mL/min — ABNORMAL LOW (ref >60)
Glucose, Bld: 190 mg/dL — ABNORMAL HIGH (ref 70–99)
Potassium: 3.5 mmol/L (ref 3.5–5.1)
Sodium: 139 mmol/L (ref 135–145)

## 2020-05-19 LAB — MAGNESIUM: Magnesium: 1.2 mg/dL — ABNORMAL LOW (ref 1.7–2.4)

## 2020-05-19 MED ORDER — HYDRALAZINE HCL 25 MG PO TABS
100.0000 mg | ORAL_TABLET | Freq: Three times a day (TID) | ORAL | Status: DC
  Administered 2020-05-19: 100 mg via ORAL
  Filled 2020-05-19: qty 4

## 2020-05-19 MED ORDER — BUTALBITAL-APAP-CAFFEINE 50-325-40 MG PO TABS
1.0000 | ORAL_TABLET | Freq: Four times a day (QID) | ORAL | 0 refills | Status: AC | PRN
Start: 2020-05-19 — End: 2021-05-19

## 2020-05-19 MED ORDER — OMEGA-3-ACID ETHYL ESTERS 1 G PO CAPS: 1.0000 g | ORAL_CAPSULE | Freq: Two times a day (BID) | ORAL | 1 refills | Status: DC

## 2020-05-19 MED ORDER — LEVEMIR FLEXTOUCH 100 UNIT/ML ~~LOC~~ SOPN
20.0000 [IU] | PEN_INJECTOR | Freq: Every day | SUBCUTANEOUS | Status: DC
Start: 2020-05-19 — End: 2021-02-20

## 2020-05-19 MED ORDER — HYDRALAZINE HCL 100 MG PO TABS: 100.0000 mg | ORAL_TABLET | Freq: Three times a day (TID) | ORAL | 2 refills | Status: DC

## 2020-05-19 MED ORDER — METHOCARBAMOL 500 MG PO TABS: 500.0000 mg | ORAL_TABLET | Freq: Four times a day (QID) | ORAL | 0 refills | Status: DC | PRN

## 2020-05-19 MED ORDER — TOPIRAMATE 50 MG PO TABS: 50.0000 mg | ORAL_TABLET | Freq: Two times a day (BID) | ORAL | 1 refills | Status: DC

## 2020-05-19 NOTE — Progress Notes (Signed)
IV removed by patient, patient at nursing station stating she needs to go home now. Patient's daughter-in-law with patient.  Patient spoke with Dr. Dyann Kief regarding discharge information, and was upset with medications that she was being discharged home with, states that she did not receive the right medications to go home with and feels like she did not receive the care she needed in the hospital.  Discharge medications and follow-up appointments were reviewed with patient and her daughter-in-law and both verbalized understanding. Patient spoke with Inis Sizer, President of [facility]: Lambert Rhonda W regarding her issues while in the hospital. Patient taken to front lobby of hospital via wheelchair and transported home by her daughter-in-law.

## 2020-05-19 NOTE — Discharge Summary (Signed)
Physician Discharge Summary  Heather [name] JKK:[id number] DOB: September 26, 1951 DOA: 05/16/2020  PCP: [name], MD  Admit date: 05/16/2020 Discharge date: 05/19/2020  Time spent: 35 minutes  Recommendations for Outpatient Follow-up:  1. Repeat basic metabolic panel to follow electrolytes and renal function 2. Reassess blood pressure and further adjust antihypertensive regimen as needed 3. Outpatient follow-up with neurology service to continue assisting with management of her migraine.   Discharge Diagnoses:  Principal Problem:   Headache Active Problems:   Essential hypertension   Type 2 diabetes with nephropathy (HCC)   GERD (gastroesophageal reflux disease)   Gout   Mixed hyperlipidemia   Acute kidney injury superimposed on CKD (HCC)   Type 2 diabetes mellitus without complications (HCC)   Hypokalemia   Hypomagnesemia   Dehydration   Hypertensive urgency   Diplopia   Discharge Condition: Stable and in no acute distress.  Patient instructed to follow-up with PCP, neurology and nephrology service after discharge.  CODE STATUS: DNR.  Diet recommendation: Heart healthy and modified carbohydrate diet.  Filed Weights   05/16/20 1907  Weight: 60.3 kg    History of present illness:  As per H&P written by Dr. Josephine Cables on 05/17/2020 Heather [name] a 69 y.o.femalewith medical history significant forDM2, CAD,HTN, Gout, Backpain s/p posterior spinal fusion of L3-L5on 09/14/2019 at [place] presents to the emergency department due to onset of headache and double vision which started around 9 AM yesterday. Headache started while sitting on the sofa, pain was in the frontal area and this migrated to the back of the head and extended to neck and bilateral shoulders and this was associated with the development of double vision. She denies any aggravating/elevating factor, patient denies tobacco use, alcohol or any recreational drug use. She denied  drinking coffee prior to onset of symptoms. She states that she checkedher blood pressure and it was 240/100, she endorsed nausea and nonbloody vomiting x1, but denies any extremity weakness, numbness, tingling,chest pain or shortness of breath. Patient states that she tends to deviate to the left when walking andheadache is worse when she looks to the left.  ED Course: In the emergency department, BP was 192/109, otherwise she was hemodynamically stable. Work-up in the ED showed hypokalemia, hypomagnesemia, elevated BUN/Creatinine 36/1.96 (this was 1.15 about 3 months ago but has slowly started trending up since 2 months ago-1.37>1.64). SARS coronavirus 2 was negative. She was treated with IV Reglan Benadryl due to the headache, and morphine were also given. Potassium and magnesium were replenished. Teleneurology was consulted and was concerned for either a 6th or 3rdnerve palsy as well as possible stroke due to patient's hypertension. MRI of brain was recommended. Hospitalist was asked to admit patient for further evaluation and management.  Hospital Course:  1-uncontrolled headache/TIA -Stroke work-up negative for acute ischemic or normalities -Patient continued to have headaches (but expressed some improvement). -fioricet PRN and robaxin prescribed. -Carotid Dopplers and 2D echo not revealing significant abnormalities. -Continue aspirin and statins for secondary prevention -Per neurology recommendations she will follow up with him in 2-3 weeks to continue working on treatment for her migraine. -Continue analgesic management and preventive therapy for headaches.  2-hypertensive urgency -Continue slowly to control patient blood pressure . -At discharge patient systolic blood pressure isolated in the 160s 170s range; she will continue the use of adjusted dose of hydralazine 100 mg 3 times a day along with metoprolol 100 mg twice a day.  Patient will resume the use of Lasix 40 mg  daily. -Reassess blood pressure and further adjust antihypertensive regimen as required. -Low-sodium diet has been encouraged.  3-gastroesophageal reflux disease -Continue PPI.  4-history of hyperlipidemia -Continue statins.  5-hypokalemia/hypomagnesemia -Most likely associated with chronic diuretic usage. -Continue to follow electrolytes trend and further replete as needed.  Repeat basic metabolic panel follow-up visit to reassess electrolytes stability. -Within normal limits at time of discharge.  6-brain mass -Has been seen since 2016 -Found to have slight enlargement in comparison to 2016 images, without mass-effect. -Per neurology recommendation MRA has been checked and not structural abnormalities seen on her vessels.  -Further treatment decisions to be determined as an outpatient.Marland Kitchen  7-acute kidney injury on chronic kidney disease stage IV -Creatinine at baseline around 1.15-1.37 -Creatinine peaked at 1.96 on presentation. -Continue to renally adjust her medications, minimize the use of nephrotoxic agents and avoid hypotension. -Creatinine currently 1.17 -Repeat basic metabolic panel at follow-up visit in order to follow trend/stability. -Patient advised to follow heart healthy diet.  -Continue patient follow-up with nephrology service.  8-type 2 diabetes mellitus with nephropathy -Follow CBGs and adjust hypoglycemic regimen as needed. -Resume home hypoglycemic regimen and follow modified carbohydrate diet. -Continue outpatient follow-up with nephrology service.  9-history of gout -No signs or symptoms of acute flare. -Advised to maintain adequate hydration. -Resume the use of allopurinol daily.  10-depression/anxiety/insomnia -Stable mood; no suicide ideation or hallucinations. -Resume home psychotropic agents and a sleeping aid medication.  Procedures: See below for x-ray reports. 2D echo: Preserved ejection fraction, no thrombi or significant valvular  disorder.  Consultations:  Neurology service (Dr. Merlene Laughter).  Discharge Exam: Vitals:   05/19/20 1309 05/19/20 1325  BP: (!) 177/68 (!) 164/61  Pulse: 87 87  Resp: 18 18  Temp: 99.3 F (37.4 C) 98.3 F (36.8 C)  SpO2: 100% 97%    General: No fever, no chest pain, no nausea, no vomiting, no palpitations.  Still having some headache but patient expressed that is improved.  No focal weaknesses appreciated. Cardiovascular: S1-S2, no rubs, no gallops, no JVD. Respiratory: Good oxygen saturation on room air; no using accessory muscles.  No wheezing or crackles appreciated on exam. Abdomen: Soft, nontender, nondistended, positive bowel sounds Extremities: No cyanosis or clubbing.  Discharge Instructions   Discharge Instructions    Diet - low sodium heart healthy   Complete by: As directed    Discharge instructions   Complete by: As directed    Follow low-sodium diet (less than 2.5 g daily) Maintain adequate hydration Arrange follow-up with PCP in 10 days Follow-up with Dr. Merlene Laughter in 3 weeks (further work-up and treatment of underlying migraines). Continue to follow-up with nephrology service as previously instructed.     Allergies as of 05/19/2020      Reactions   Duloxetine Hcl Swelling   Fluocinolone Swelling   Acetaminophen Itching, Swelling   Pt states "some swelling"   Amlodipine Besylate Swelling   Swelling of feet, fluid retention   Ciprofloxacin Nausea And Vomiting, Swelling   Swelling of face, jaw, and lips   Hydrocodone-acetaminophen Itching   Metformin Other (See Comments)   REACTION: GI UPSET   Nsaids Other (See Comments)   "HAS BLEEDING ULCERS"   Quinolones Swelling   Sulfamethoxazole Swelling   Swelling of feet, legs   Valsartan Swelling   Cozaar [losartan] Swelling, Rash   Lisinopril Swelling, Rash   Oral swelling, and red streaks on arms/stomach   Tape Itching, Rash   Paper tape can only be used on this patient  Medication List    STOP  taking these medications   tiZANidine 4 MG tablet Commonly known as: ZANAFLEX     TAKE these medications   allopurinol 100 MG tablet Commonly known as: ZYLOPRIM Take 100 mg by mouth daily.   aspirin EC 81 MG tablet Take 81 mg by mouth every morning.   atorvastatin 80 MG tablet Commonly known as: LIPITOR Take 80 mg by mouth every morning.   butalbital-acetaminophen-caffeine 50-325-40 MG tablet Commonly known as: FIORICET Take 1 tablet by mouth every 6 (six) hours as needed for migraine.   citalopram 40 MG tablet Commonly known as: CELEXA Take 40 mg by mouth daily.   cyanocobalamin 1000 MCG/ML injection Commonly known as: (VITAMIN B-12) Inject 1,000 mcg into the muscle every 30 (thirty) days.   diazepam 5 MG tablet Commonly known as: VALIUM Take 1 tablet (5 mg total) by mouth 2 (two) times daily as needed for anxiety.   esomeprazole 40 MG capsule Commonly known as: NEXIUM Take 40 mg by mouth daily before breakfast.   furosemide 40 MG tablet Commonly known as: LASIX Take 1 tablet (40 mg total) by mouth daily. What changed: when to take this   HUMALOG KWIKPEN [place] Inject 20 Units into the skin 3 (three) times daily with meals.   hydrALAZINE 100 MG tablet Commonly known as: APRESOLINE Take 1 tablet (100 mg total) by mouth every 8 (eight) hours. What changed:   medication strength  how much to take  how to take this  when to take this  additional instructions   Levemir FlexTouch 100 UNIT/ML FlexPen Generic drug: insulin detemir Inject 20 Units into the skin at bedtime. Patient adjust for her sugars   methocarbamol 500 MG tablet Commonly known as: ROBAXIN Take 1 tablet (500 mg total) by mouth every 6 (six) hours as needed for muscle spasms.   metoprolol tartrate 100 MG tablet Commonly known as: LOPRESSOR Take 100 mg by mouth 2 (two) times daily.   omega-3 acid ethyl esters 1 g capsule Commonly known as: LOVAZA Take 1 capsule (1 g total) by mouth 2  (two) times daily.   Ozempic (0.25 or 0.5 MG/DOSE) 2 MG/1.5ML Sopn Generic drug: Semaglutide(0.25 or 0.5MG /DOS) Inject 2 mg into the skin every Sunday.   potassium chloride SA 20 MEQ tablet Commonly known as: KLOR-CON Take 20 mEq by mouth every morning.   topiramate 50 MG tablet Commonly known as: TOPAMAX Take 1 tablet (50 mg total) by mouth 2 (two) times daily.   venlafaxine XR 75 MG 24 hr capsule Commonly known as: EFFEXOR-XR Take 75 mg by mouth daily.   vitamin C with rose hips 500 MG tablet Take 500 mg by mouth every morning.   Vitamin D 50 MCG (2000 UT) Caps Take 2,000 Units by mouth every morning.   zolpidem 10 MG tablet Commonly known as: AMBIEN Take 10 mg by mouth at bedtime.      Allergies  Allergen Reactions  . Duloxetine Hcl Swelling  . Fluocinolone Swelling  . Acetaminophen Itching and Swelling    Pt states "some swelling"  . Amlodipine Besylate Swelling    Swelling of feet, fluid retention  . Ciprofloxacin Nausea And Vomiting and Swelling    Swelling of face, jaw, and lips  . Hydrocodone-Acetaminophen Itching  . Metformin Other (See Comments)    REACTION: GI UPSET  . Nsaids Other (See Comments)    "HAS BLEEDING ULCERS"  . Quinolones Swelling  . Sulfamethoxazole Swelling    Swelling of feet, legs  .  Valsartan Swelling  . Cozaar [Losartan] Swelling and Rash  . Lisinopril Swelling and Rash    Oral swelling, and red streaks on arms/stomach  . Tape Itching and Rash    Paper tape can only be used on this patient    Follow-up Information    [name], MD. Schedule an appointment as soon as possible for a visit in 10 day(s).   Specialty: Internal Medicine Contact information: [place] [address] [phone]        Phillips Odor, MD. Go in 3 week(s).   Specialty: Neurology Contact information: [address] [place] [address] [phone]                The results of significant diagnostics from this  hospitalization (including imaging, microbiology, ancillary and laboratory) are listed below for reference.    Significant Diagnostic Studies: CT Head Wo Contrast  Result Date: 05/16/2020 CLINICAL DATA:  Headache double vision EXAM: CT HEAD WITHOUT CONTRAST TECHNIQUE: Contiguous axial images were obtained from the base of the skull through the vertex without intravenous contrast. COMPARISON:  CT 03/02/2020 FINDINGS: Brain: No acute territorial infarction, hemorrhage or intracranial mass. Mild atrophy. Stable ventricle size. Vascular: No hyperdense vessels.  Carotid vascular calcification Skull: Normal. Negative for fracture or focal lesion. Sinuses/Orbits: No acute finding. Other: None IMPRESSION: No CT evidence for acute intracranial abnormality. Electronically Signed   By: Donavan Foil M.D.   On: 05/16/2020 21:37   MR ANGIO HEAD WO CONTRAST  Result Date: 05/18/2020 CLINICAL DATA:  Double vision EXAM: MRA HEAD WITHOUT CONTRAST TECHNIQUE: Angiographic images of the Circle of Willis were obtained using MRA technique without intravenous contrast. COMPARISON:  None. FINDINGS: Intracranial internal carotid arteries are patent with atherosclerotic irregularity. Middle and anterior cerebral arteries are patent. Visualized intracranial vertebral arteries, basilar artery, posterior cerebral arteries are patent. A left posterior communicating arteries present, which appears to supply the left posterior cerebral artery. There is suggestion of a stenotic or diminutive left P1 PCA segment. There is no aneurysm. IMPRESSION: No proximal intracranial vessel occlusion. Electronically Signed   By: Macy Mis M.D.   On: 05/18/2020 15:07   MR BRAIN WO CONTRAST  Result Date: 05/17/2020 CLINICAL DATA:  Neuro deficit, acute, stroke suspected. Additional provided: Patient reports headache and double vision, emesis, diarrhea for 2 days. EXAM: MRI HEAD WITHOUT CONTRAST TECHNIQUE: Multiplanar, multiecho pulse sequences of  the brain and surrounding structures were obtained without intravenous contrast. COMPARISON:  Prior head CT examinations 05/16/2020 and earlier, brain MRI 01/13/2015. FINDINGS: Brain: Mild generalized parenchymal atrophy. Mild scattered T2/FLAIR hyperintensity within the cerebral white matter and pons is nonspecific, but consistent with chronic small vessel ischemic disease. Findings have progressed as compared to the prior MRI of 01/13/2015. There is a dural-based extra-axial mass overlying the anterior right frontal lobe which measures 6 mm (series 15, image 17). In retrospect, this was present on the prior MRI, and has slightly increased in size since that time. There is no acute infarct. No evidence of intracranial mass. No chronic intracranial blood products. No extra-axial fluid collection. No midline shift. Vascular: Expected proximal arterial flow voids. Skull and upper cervical spine: No focal marrow lesion. Susceptibility artifact arising from cervical spinal fusion hardware. Sinuses/Orbits: Visualized orbits show no acute finding. Mild ethmoid sinus mucosal thickening. Small bilateral mastoid effusions. IMPRESSION: No evidence of acute intracranial abnormality, including acute infarction. Mild chronic small vessel ischemic changes within the cerebral white matter and pons, progressed as compared to the  prior MRI of 01/13/2015. 6 mm dural-based mass overlying the anterior right frontal lobe, slightly increased in size as compared to the prior MRI and likely reflecting a small incidental meningioma. Mild generalized parenchymal atrophy. Mild ethmoid sinus mucosal thickening. Small bilateral mastoid effusions. Electronically Signed   By: Kellie Simmering DO   On: 05/17/2020 11:39   US Carotid Bilateral  Result Date: 05/17/2020 CLINICAL DATA:  Acute ischemic stroke. Double vision. History of hypertension and diabetes. EXAM: BILATERAL CAROTID DUPLEX ULTRASOUND TECHNIQUE: Pearline Cables scale imaging, color Doppler and  duplex ultrasound were performed of bilateral carotid and vertebral arteries in the neck. COMPARISON:  None. FINDINGS: Criteria: Quantification of carotid stenosis is based on velocity parameters that correlate the residual internal carotid diameter with NASCET-based stenosis levels, using the diameter of the distal internal carotid lumen as the denominator for stenosis measurement. The following velocity measurements were obtained: RIGHT ICA: 105/76 cm/sec CCA: 31/54 cm/sec SYSTOLIC ICA/CCA RATIO:  1.4 ECA: 126 cm/sec LEFT ICA: 72/78 cm/sec CCA: 00/86 cm/sec SYSTOLIC ICA/CCA RATIO:  0.9 ECA: 116 cm/sec RIGHT CAROTID ARTERY: There is a moderate amount of eccentric echogenic partially shadowing plaque within the right carotid bulb (images 14 and 16), extending to involve the origin and proximal aspects of the right internal carotid artery (image 26), not resulting in elevated peak systolic velocities within the interrogated course of the right internal carotid artery to suggest a hemodynamically significant stenosis. RIGHT VERTEBRAL ARTERY:  Antegrade flow LEFT CAROTID ARTERY: There is a minimal amount of eccentric echogenic plaque within the distal aspect the left common carotid artery (image 47. There is a moderate amount of eccentric echogenic plaque within the left carotid bulb (images 50 and 52), extending to involve the origin and proximal aspects of the left internal carotid artery (image 61), not resulting in elevated peak systolic velocities within the interrogated course of the left internal carotid artery to suggest a hemodynamically significant stenosis. LEFT VERTEBRAL ARTERY:  Antegrade flow IMPRESSION: Moderate amount of bilateral atherosclerotic plaque, not resulting in a hemodynamically significant stenosis within either internal carotid artery. Electronically Signed   By: Sandi Mariscal M.D.   On: 05/17/2020 12:49   ECHOCARDIOGRAM COMPLETE  Result Date: 05/17/2020    ECHOCARDIOGRAM REPORT   Patient  Name:   BRANDIS WIXTED Date of Exam: 05/17/2020 Medical Rec #:  [id number]           Height:       63.0 in Accession #:    [id number]          Weight:       133.0 lb Date of Birth:  09/05/1951           BSA:          1.626 m Patient Age:    72 years            BP:           178/71 mmHg Patient Gender: F                   HR:           66 bpm. Exam Location:  [facility] Procedure: 2D Echo, Cardiac Doppler and Color Doppler Indications:    Stroke 434.91 / I163.9  History:        Patient has prior history of Echocardiogram examinations, most                 recent 12/26/2016. Risk Factors:Hypertension, Diabetes and  Dyslipidemia. TRICUSPID REGURGITATION, MODERATE, CKD (chronic                 kidney disease) stage 3, GFR 30-59 ml/min (From Hx).  Sonographer:    Alvino Chapel RCS Referring Phys: [id number] OLADAPO ADEFESO IMPRESSIONS  1. Left ventricular ejection fraction, by estimation, is 60 to 65%. The left ventricle has normal function. The left ventricle has no regional wall motion abnormalities. There is mild left ventricular hypertrophy. Left ventricular diastolic parameters are consistent with Grade I diastolic dysfunction (impaired relaxation).  2. Right ventricular systolic function is normal. The right ventricular size is normal. There is normal pulmonary artery systolic pressure.  3. The mitral valve is normal in structure. No evidence of mitral valve regurgitation. No evidence of mitral stenosis.  4. The aortic valve is tricuspid. Aortic valve regurgitation is not visualized. No aortic stenosis is present.  5. The inferior vena cava is normal in size with greater than 50% respiratory variability, suggesting right atrial pressure of 3 mmHg. FINDINGS  Left Ventricle: Left ventricular ejection fraction, by estimation, is 60 to 65%. The left ventricle has normal function. The left ventricle has no regional wall motion abnormalities. The left ventricular internal cavity size was normal in size.  There is  mild left ventricular hypertrophy. Left ventricular diastolic parameters are consistent with Grade I diastolic dysfunction (impaired relaxation). Normal left ventricular filling pressure. Right Ventricle: The right ventricular size is normal. No increase in right ventricular wall thickness. Right ventricular systolic function is normal. There is normal pulmonary artery systolic pressure. The tricuspid regurgitant velocity is 2.32 m/s, and  with an assumed right atrial pressure of 3 mmHg, the estimated right ventricular systolic pressure is 99.2 mmHg. Left Atrium: Left atrial size was normal in size. Right Atrium: Right atrial size was normal in size. Pericardium: There is no evidence of pericardial effusion. Mitral Valve: The mitral valve is normal in structure. No evidence of mitral valve regurgitation. No evidence of mitral valve stenosis. Tricuspid Valve: The tricuspid valve is normal in structure. Tricuspid valve regurgitation is not demonstrated. No evidence of tricuspid stenosis. Aortic Valve: The aortic valve is tricuspid. . There is mild thickening and mild calcification of the aortic valve. Aortic valve regurgitation is not visualized. No aortic stenosis is present. Mild aortic valve annular calcification. There is mild thickening of the aortic valve. There is mild calcification of the aortic valve. Aortic valve mean gradient measures 6.1 mmHg. Aortic valve peak gradient measures 10.8 mmHg. Aortic valve area, by VTI measures 1.77 cm. Pulmonic Valve: The pulmonic valve was not well visualized. Pulmonic valve regurgitation is not visualized. No evidence of pulmonic stenosis. Aorta: The aortic root is normal in size and structure. Pulmonary Artery: Indeterminant PASP, inadequate TR jet. Venous: The inferior vena cava is normal in size with greater than 50% respiratory variability, suggesting right atrial pressure of 3 mmHg. IAS/Shunts: No atrial level shunt detected by color flow Doppler.  LEFT  VENTRICLE PLAX 2D LVIDd:         4.40 cm  Diastology LVIDs:         2.89 cm  LV e' lateral:   7.51 cm/s LV PW:         0.99 cm  LV E/e' lateral: 11.1 LV IVS:        1.00 cm  LV e' medial:    5.66 cm/s LVOT diam:     1.80 cm  LV E/e' medial:  14.8 LV SV:  62 LV SV Index:   38 LVOT Area:     2.54 cm  RIGHT VENTRICLE RV S prime:     9.36 cm/s TAPSE (M-mode): 1.6 cm LEFT ATRIUM             Index       RIGHT ATRIUM           Index LA diam:        3.60 cm 2.21 cm/m  RA Area:     13.30 cm LA Vol (A2C):   47.8 ml 29.40 ml/m RA Volume:   33.20 ml  20.42 ml/m LA Vol (A4C):   48.2 ml 29.65 ml/m LA Biplane Vol: 50.1 ml 30.82 ml/m  AORTIC VALVE AV Area (Vmax):    1.72 cm AV Area (Vmean):   1.48 cm AV Area (VTI):     1.77 cm AV Vmax:           163.97 cm/s AV Vmean:          116.782 cm/s AV VTI:            0.350 m AV Peak Grad:      10.8 mmHg AV Mean Grad:      6.1 mmHg LVOT Vmax:         111.00 cm/s LVOT Vmean:        67.900 cm/s LVOT VTI:          0.243 m LVOT/AV VTI ratio: 0.70  AORTA Ao Root diam: 2.70 cm MITRAL VALVE               TRICUSPID VALVE MV Area (PHT): 2.82 cm    TR Peak grad:   21.5 mmHg MV Decel Time: 269 msec    TR Vmax:        232.00 cm/s MV E velocity: 83.60 cm/s MV A velocity: 98.50 cm/s  SHUNTS MV E/A ratio:  0.85        Systemic VTI:  0.24 m                            Systemic Diam: 1.80 cm Carlyle Dolly MD Electronically signed by Carlyle Dolly MD Signature Date/Time: 05/17/2020/12:50:47 PM    Final     Microbiology: Recent Results (from the past 240 hour(s))  SARS Coronavirus 2 by RT PCR (hospital order, performed in [facility] hospital lab) Nasopharyngeal Nasopharyngeal Swab     Status: None   Collection Time: 05/16/20 11:00 PM   Specimen: Nasopharyngeal Swab  Result Value Ref Range Status   SARS Coronavirus 2 NEGATIVE NEGATIVE Final    Comment: (NOTE) SARS-CoV-2 target nucleic acids are NOT DETECTED.  The SARS-CoV-2 RNA is generally detectable in upper and  lower respiratory specimens during the acute phase of infection. The lowest concentration of SARS-CoV-2 viral copies this assay can detect is 250 copies / mL. A negative result does not preclude SARS-CoV-2 infection and should not be used as the sole basis for treatment or other patient management decisions.  A negative result may occur with improper specimen collection / handling, submission of specimen other than nasopharyngeal swab, presence of viral mutation(s) within the areas targeted by this assay, and inadequate number of viral copies (<250 copies / mL). A negative result must be combined with clinical observations, patient history, and epidemiological information.  Fact Sheet for Patients:   StrictlyIdeas.no  Fact Sheet for Healthcare Providers: BankingDealers.co.za  This test is not yet approved or  cleared by  the [employer] and has been authorized for detection and/or diagnosis of SARS-CoV-2 by FDA under an Emergency Use Authorization (EUA).  This EUA will remain in effect (meaning this test can be used) for the duration of the COVID-19 declaration under Section 564(b)(1) of the Act, 21 U.S.C. section 360bbb-3(b)(1), unless the authorization is terminated or revoked sooner.  Performed at [facility], [address], [place], [place] [address]      Labs: Basic Metabolic Panel: Recent Labs  Lab 05/16/20 2052 05/17/20 0445 05/19/20 0640  NA 138 138 139  K 2.5* 3.7 3.5  CL 103 107 111  CO2 22 20* 19*  GLUCOSE 237* 158* 190*  BUN 36* 31* 19  CREATININE 1.96* 1.65* 1.17*  CALCIUM 9.2 8.6* 8.9  MG 1.4* 1.8 1.2*  PHOS  --  2.2*  --    Liver Function Tests: Recent Labs  Lab 05/17/20 0445  AST 18  ALT 15  ALKPHOS 57  BILITOT 0.4  PROT 7.1  ALBUMIN 4.0   No results for input(s): LIPASE, AMYLASE in the last 168 hours. No results for input(s): AMMONIA in the last 168 hours. CBC: Recent Labs  Lab  05/16/20 2052 05/17/20 0445  WBC 6.7 7.6  NEUTROABS 3.9  --   HGB 11.9* 11.4*  HCT 34.7* 34.7*  MCV 94.0 97.5  PLT 231 235    CBG: Recent Labs  Lab 05/18/20 1113 05/18/20 1606 05/18/20 2119 05/19/20 0721 05/19/20 1107  GLUCAP 194* 141* 206* 170* 267*    Signed:  Barton Dubois MD.  [facility] 05/19/2020, 1:47 PM

## 2020-05-23 DIAGNOSIS — D329 Benign neoplasm of meninges, unspecified: Secondary | ICD-10-CM | POA: Diagnosis not present

## 2020-05-23 DIAGNOSIS — E1165 Type 2 diabetes mellitus with hyperglycemia: Secondary | ICD-10-CM | POA: Diagnosis not present

## 2020-05-23 DIAGNOSIS — I779 Disorder of arteries and arterioles, unspecified: Secondary | ICD-10-CM | POA: Diagnosis not present

## 2020-05-23 DIAGNOSIS — I10 Essential (primary) hypertension: Secondary | ICD-10-CM | POA: Diagnosis not present

## 2020-05-23 DIAGNOSIS — Z299 Encounter for prophylactic measures, unspecified: Secondary | ICD-10-CM | POA: Diagnosis not present

## 2020-05-30 DIAGNOSIS — D329 Benign neoplasm of meninges, unspecified: Secondary | ICD-10-CM | POA: Diagnosis not present

## 2020-05-30 DIAGNOSIS — R030 Elevated blood-pressure reading, without diagnosis of hypertension: Secondary | ICD-10-CM | POA: Insufficient documentation

## 2020-05-31 DIAGNOSIS — I10 Essential (primary) hypertension: Secondary | ICD-10-CM | POA: Diagnosis not present

## 2020-05-31 DIAGNOSIS — E119 Type 2 diabetes mellitus without complications: Secondary | ICD-10-CM | POA: Diagnosis not present

## 2020-07-01 DIAGNOSIS — I509 Heart failure, unspecified: Secondary | ICD-10-CM | POA: Diagnosis not present

## 2020-07-01 DIAGNOSIS — E1165 Type 2 diabetes mellitus with hyperglycemia: Secondary | ICD-10-CM | POA: Diagnosis not present

## 2020-07-01 DIAGNOSIS — R739 Hyperglycemia, unspecified: Secondary | ICD-10-CM | POA: Diagnosis not present

## 2020-07-01 DIAGNOSIS — R112 Nausea with vomiting, unspecified: Secondary | ICD-10-CM | POA: Diagnosis not present

## 2020-07-01 DIAGNOSIS — Z299 Encounter for prophylactic measures, unspecified: Secondary | ICD-10-CM | POA: Diagnosis not present

## 2020-07-07 DIAGNOSIS — Z299 Encounter for prophylactic measures, unspecified: Secondary | ICD-10-CM | POA: Diagnosis not present

## 2020-07-07 DIAGNOSIS — E1122 Type 2 diabetes mellitus with diabetic chronic kidney disease: Secondary | ICD-10-CM | POA: Diagnosis not present

## 2020-07-07 DIAGNOSIS — I509 Heart failure, unspecified: Secondary | ICD-10-CM | POA: Diagnosis not present

## 2020-07-07 DIAGNOSIS — K859 Acute pancreatitis without necrosis or infection, unspecified: Secondary | ICD-10-CM | POA: Diagnosis not present

## 2020-07-07 DIAGNOSIS — E1165 Type 2 diabetes mellitus with hyperglycemia: Secondary | ICD-10-CM | POA: Diagnosis not present

## 2020-07-07 DIAGNOSIS — I10 Essential (primary) hypertension: Secondary | ICD-10-CM | POA: Diagnosis not present

## 2020-07-15 ENCOUNTER — Ambulatory Visit: Payer: Medicare Other | Admitting: Cardiology

## 2020-07-18 DIAGNOSIS — I509 Heart failure, unspecified: Secondary | ICD-10-CM | POA: Diagnosis not present

## 2020-07-18 DIAGNOSIS — I10 Essential (primary) hypertension: Secondary | ICD-10-CM | POA: Diagnosis not present

## 2020-07-18 DIAGNOSIS — Z299 Encounter for prophylactic measures, unspecified: Secondary | ICD-10-CM | POA: Diagnosis not present

## 2020-07-18 DIAGNOSIS — G4700 Insomnia, unspecified: Secondary | ICD-10-CM | POA: Diagnosis not present

## 2020-07-19 DIAGNOSIS — R809 Proteinuria, unspecified: Secondary | ICD-10-CM | POA: Diagnosis not present

## 2020-07-19 DIAGNOSIS — E1122 Type 2 diabetes mellitus with diabetic chronic kidney disease: Secondary | ICD-10-CM | POA: Diagnosis not present

## 2020-07-19 DIAGNOSIS — E1129 Type 2 diabetes mellitus with other diabetic kidney complication: Secondary | ICD-10-CM | POA: Diagnosis not present

## 2020-07-19 DIAGNOSIS — N189 Chronic kidney disease, unspecified: Secondary | ICD-10-CM | POA: Diagnosis not present

## 2020-07-19 DIAGNOSIS — N170 Acute kidney failure with tubular necrosis: Secondary | ICD-10-CM | POA: Diagnosis not present

## 2020-07-22 DIAGNOSIS — E211 Secondary hyperparathyroidism, not elsewhere classified: Secondary | ICD-10-CM | POA: Diagnosis not present

## 2020-07-22 DIAGNOSIS — N189 Chronic kidney disease, unspecified: Secondary | ICD-10-CM | POA: Diagnosis not present

## 2020-07-22 DIAGNOSIS — I129 Hypertensive chronic kidney disease with stage 1 through stage 4 chronic kidney disease, or unspecified chronic kidney disease: Secondary | ICD-10-CM | POA: Diagnosis not present

## 2020-07-22 DIAGNOSIS — I5032 Chronic diastolic (congestive) heart failure: Secondary | ICD-10-CM | POA: Diagnosis not present

## 2020-07-22 DIAGNOSIS — R809 Proteinuria, unspecified: Secondary | ICD-10-CM | POA: Diagnosis not present

## 2020-07-28 DIAGNOSIS — E119 Type 2 diabetes mellitus without complications: Secondary | ICD-10-CM | POA: Diagnosis not present

## 2020-07-28 DIAGNOSIS — I10 Essential (primary) hypertension: Secondary | ICD-10-CM | POA: Diagnosis not present

## 2020-08-04 DIAGNOSIS — I10 Essential (primary) hypertension: Secondary | ICD-10-CM | POA: Diagnosis not present

## 2020-08-04 DIAGNOSIS — Z299 Encounter for prophylactic measures, unspecified: Secondary | ICD-10-CM | POA: Diagnosis not present

## 2020-08-04 DIAGNOSIS — I509 Heart failure, unspecified: Secondary | ICD-10-CM | POA: Diagnosis not present

## 2020-08-04 DIAGNOSIS — R519 Headache, unspecified: Secondary | ICD-10-CM | POA: Diagnosis not present

## 2020-08-04 DIAGNOSIS — D329 Benign neoplasm of meninges, unspecified: Secondary | ICD-10-CM | POA: Diagnosis not present

## 2020-08-04 DIAGNOSIS — R55 Syncope and collapse: Secondary | ICD-10-CM | POA: Diagnosis not present

## 2020-08-08 ENCOUNTER — Ambulatory Visit: Payer: Medicare Other | Admitting: Nurse Practitioner

## 2020-08-08 NOTE — Progress Notes (Deleted)
08/08/2020 Heather [name] [id number] June 26, 1951   Chief Complaint:  History of Present Illness: Heather [name]. Heather [name] is a 69 year old female with a past medical history of anxiety, depression, hypertension, ? CAD,  DM II, neuropathy, vitamin B12 deficiency, brain mass (probable meningioma), CKD stage III, kidney stones, chronic anemia,  gout,  GERD, prepyloric ulcer 2012 and IBS-D. Past cholecystectomy. S/P posterior spinal fusion of L3-L5on 09/14/2019 . I last saw the patient in office on 03/10/2020 with complaints of lower abdominal pain and chronic diarrhea. She underwent a colonoscopy 03/18/2020 which was normal, no evidence of IBD or microscopic colitis. Normal EGD 02/08/2020.  CBC Latest Ref Rng & Units 05/17/2020 05/16/2020 03/02/2020  WBC 4.0 - 10.5 K/uL 7.6 6.7 7.0  Hemoglobin 12.0 - 15.0 g/dL 11.4(L) 11.9(L) 11.6(L)  Hematocrit 36 - 46 % 34.7(L) 34.7(L) 34.9(L)  Platelets 150 - 400 K/uL 235 231 160   CMP Latest Ref Rng & Units 05/19/2020 05/17/2020 05/16/2020  Glucose 70 - 99 mg/dL 190(H) 158(H) 237(H)  BUN 8 - 23 mg/dL 19 31(H) 36(H)  Creatinine 0.44 - 1.00 mg/dL 1.17(H) 1.65(H) 1.96(H)  Sodium 135 - 145 mmol/L 139 138 138  Potassium 3.5 - 5.1 mmol/L 3.5 3.7 2.5(LL)  Chloride 98 - 111 mmol/L 111 107 103  CO2 22 - 32 mmol/L 19(L) 20(L) 22  Calcium 8.9 - 10.3 mg/dL 8.9 8.6(L) 9.2  Total Protein 6.5 - 8.1 g/dL - 7.1 -  Total Bilirubin 0.3 - 1.2 mg/dL - 0.4 -  Alkaline Phos 38 - 126 U/L - 57 -  AST 15 - 41 U/L - 18 -  ALT 0 - 44 U/L - 15 -    Colonoscopy 03/18/2020: - The examined portion of the ileum was normal. - The entire examined colon is normal on direct and retroflexion views. - Biopsies were taken with a cold forceps from the ascending colon, transverse colon, descending colon, sigmoid colon and rectum for evaluation of microscopic colitis. - No further screening colonoscopies recommended.   EGD 02/08/2020:  - The esophagus was normal. - The stomach was  normal. - The examined duodenum was normal. - The cardia and gastric fundus were normal on retroflexion.   Brain mass, in ED 04/2020 with headache. Carotid Dopplers and 2D echo not revealing significant abnormalities  I last saw the patient in office on 03/10/2020 with complaints of lower abdominal pain and chronic diarrhea.   Abdominal/pelvic CT with contrast 03/02/2020: 1. No acute CT findings of the abdomen or pelvis to explain pain, nausea, or vomiting. 2. The small bowel and proximal colon are fluid-filled, with stool in distal colon, generally in keeping with diarrheal illness. 3. Diverticulosis without evidence of acute diverticulitis. 4.. Coronary artery disease. Aortic Atherosclerosis.   Brain MRI 05/17/2020: No evidence of acute intracranial abnormality, including acute infarction. Mild chronic small vessel ischemic changes within the cerebral white matter and pons, progressed as compared to the prior MRI of 01/13/2015. 6 mm dural-based mass overlying the anterior right frontal lobe, slightly increased in size as compared to the prior MRI and likely reflecting a small incidental meningioma. Mild generalized parenchymal atrophy. Mild ethmoid sinus mucosal thickening. Small bilateral mastoid effusions.    Current Medications, Allergies, Past Medical History, Past Surgical History, Family History and Social History were reviewed in [employer] record.   Review of Systems:   Constitutional: Negative for fever, sweats, chills or weight loss.  Respiratory: Negative for shortness of breath.   Cardiovascular: Negative for chest  pain, palpitations and leg swelling.  Gastrointestinal: See HPI.  Musculoskeletal: Negative for back pain or muscle aches.  Neurological: Negative for dizziness, headaches or paresthesias.    Physical Exam: LMP  (LMP Unknown)  General: Well developed, w   ***female in no acute distress. Head: Normocephalic and  atraumatic. Eyes: No scleral icterus. Conjunctiva pink . Ears: Normal auditory acuity. Mouth: Dentition intact. No ulcers or lesions.  Lungs: Clear throughout to auscultation. Heart: Regular rate and rhythm, no murmur. Abdomen: Soft, nontender and nondistended. No masses or hepatomegaly. Normal bowel sounds x 4 quadrants.  Rectal: *** Musculoskeletal: Symmetrical with no gross deformities. Extremities: No edema. Neurological: Alert oriented x 4. No focal deficits.  Psychological: Alert and cooperative. Normal mood and affect  Assessment and Recommendations: ***

## 2020-08-12 ENCOUNTER — Other Ambulatory Visit: Payer: Self-pay

## 2020-08-12 ENCOUNTER — Emergency Department (HOSPITAL_COMMUNITY)
Admission: EM | Admit: 2020-08-12 | Discharge: 2020-08-12 | Discharge status: Against Medical Advice | Payer: Medicare Other

## 2020-08-12 ENCOUNTER — Emergency Department (HOSPITAL_COMMUNITY): Payer: Medicare Other

## 2020-08-12 NOTE — ED Notes (Signed)
Registration informed RN Pt left

## 2020-08-12 NOTE — ED Notes (Signed)
Called for Pt from Triage. No answer

## 2020-08-22 ENCOUNTER — Other Ambulatory Visit: Payer: Self-pay

## 2020-08-22 ENCOUNTER — Ambulatory Visit (INDEPENDENT_AMBULATORY_CARE_PROVIDER_SITE_OTHER): Payer: Medicare Other | Admitting: Cardiology

## 2020-08-22 ENCOUNTER — Other Ambulatory Visit: Payer: Self-pay | Admitting: Cardiology

## 2020-08-22 ENCOUNTER — Encounter: Payer: Self-pay | Admitting: Cardiology

## 2020-08-22 VITALS — BP 162/84 | HR 82 | Ht 63.0 in | Wt 138.0 lb

## 2020-08-22 DIAGNOSIS — R002 Palpitations: Secondary | ICD-10-CM

## 2020-08-22 DIAGNOSIS — I10 Essential (primary) hypertension: Secondary | ICD-10-CM | POA: Diagnosis not present

## 2020-08-22 DIAGNOSIS — R0789 Other chest pain: Secondary | ICD-10-CM

## 2020-08-22 DIAGNOSIS — E782 Mixed hyperlipidemia: Secondary | ICD-10-CM | POA: Diagnosis not present

## 2020-08-22 MED ORDER — LABETALOL HCL 300 MG PO TABS: 300.0000 mg | ORAL_TABLET | Freq: Two times a day (BID) | ORAL | 3 refills | Status: DC

## 2020-08-22 NOTE — Patient Instructions (Signed)
Medication Instructions:  STOP Metoprolol   START Labetalol 300 mg twice a day   *If you need a refill on your cardiac medications before your next appointment, please call your pharmacy*   Lab Work: Fasting Lipids and Direct LDL  If you have labs (blood work) drawn today and your tests are completely normal, you will receive your results only by: Marland Kitchen MyChart Message (if you have MyChart) OR . A paper copy in the mail If you have any lab test that is abnormal or we need to change your treatment, we will call you to review the results.   Testing/Procedures: None today   Follow-Up: At [facility], you and your health needs are our priority.  As part of our continuing mission to provide you with exceptional heart care, we have created designated Provider Care Teams.  These Care Teams include your primary Cardiologist (physician) and Advanced Practice Providers (APPs -  Physician Assistants and Nurse Practitioners) who all work together to provide you with the care you need, when you need it.  We recommend signing up for the patient portal called "MyChart".  Sign up information is provided on this After Visit Summary.  MyChart is used to connect with patients for Virtual Visits (Telemedicine).  Patients are able to view lab/test results, encounter notes, upcoming appointments, etc.  Non-urgent messages can be sent to your provider as well.   To learn more about what you can do with MyChart, go to [email or web address].    Your next appointment:   6 month(s)  The format for your next appointment:   In Person  Provider:   Carlyle Dolly, MD   Other Instructions None    Thank you for choosing [facility] !

## 2020-08-22 NOTE — Progress Notes (Signed)
Clinical Summary Heather [name] is a 69 y.o.female seen today for follow up of the following medical problems.    1. Chronic chest pain -normal cath 2003, 2005, 2012Negative nuclear stress test in 2010 and 05/2013 -11/2016 echo normal LVEF, no WMAs  - occasional chronic pain overall unchanged  2. HTN -compliant with meds - ARB/ACE allergy, norvasc allergy - has been on hydralazine and metoprolol  3. Palpitations 07/2017 event monitor no arrhythmias - no significant symptoms   4. Hyperlipidemia - on cholystermine for diarrheaby her GI doctor, not for cholesterol - labs followd by pcp  04/2020 TC 320 TG 629 LDL cannto calculate.  - compliant with statin  4. DM2 - per pcp - allergies to ACE-I/ARB  5. CKD    6. IBS/Chronic N/V - followed by GI  7. Syncope - worked up at [facility] cardiology. There notes mention a nagetive cardiac monitor   8. Cervical radiculopathy - followed by ortho    Past Medical History:  Diagnosis Date  . Acute renal failure superimposed on stage 3 chronic kidney disease ([place]) 01/21/2016  . Anemia   . Anxiety   . B12 deficiency   . Cellulitis and abscess    Abdomen and buttocks  . CKD (chronic kidney disease) stage 3, GFR 30-59 ml/min   . Closed fracture of distal end of femur, unspecified fracture morphology, initial encounter ([place])   . Depression   . Diabetic neuropathy (HCC)    feet  . DJD (degenerative joint disease)    Right forminal stenosis C4-5  . Essential hypertension   . Femur fracture, right ([place]) 03/30/2017  . Folliculitis   . GERD (gastroesophageal reflux disease)   . Gout   . Headache(784.0)   . Hiatal hernia   . History of blood transfusion   . History of bronchitis   . History of cardiac catheterization    No significant CAD 2012  . History of kidney stones    surgery to remove  . Insomnia   . Irritable bowel syndrome   . Mixed hyperlipidemia   . Osteoarthritis   . Palpitations   .  Reflux esophagitis   . Tinnitus    Right  . Type 2 diabetes mellitus (HCC)      Allergies  Allergen Reactions  . Duloxetine Hcl Swelling  . Fluocinolone Swelling  . Acetaminophen Itching and Swelling    Pt states "some swelling"  . Amlodipine Besylate Swelling    Swelling of feet, fluid retention  . Ciprofloxacin Nausea And Vomiting and Swelling    Swelling of face, jaw, and lips  . Hydrocodone-Acetaminophen Itching  . Metformin Other (See Comments)    REACTION: GI UPSET  . Nsaids Other (See Comments)    "HAS BLEEDING ULCERS"  . Quinolones Swelling  . Sulfamethoxazole Swelling    Swelling of feet, legs  . Valsartan Swelling  . Cozaar [Losartan] Swelling and Rash  . Lisinopril Swelling and Rash    Oral swelling, and red streaks on arms/stomach  . Tape Itching and Rash    Paper tape can only be used on this patient     Current Outpatient Medications  Medication Sig Dispense Refill  . allopurinol (ZYLOPRIM) 100 MG tablet Take 100 mg by mouth daily.    . Ascorbic Acid (VITAMIN C WITH ROSE HIPS) 500 MG tablet Take 500 mg by mouth every morning.    Marland Kitchen aspirin EC 81 MG tablet Take 81 mg by mouth every morning.     Marland Kitchen atorvastatin (  LIPITOR) 80 MG tablet Take 80 mg by mouth every morning.    . butalbital-acetaminophen-caffeine (FIORICET) 50-325-40 MG tablet Take 1 tablet by mouth every 6 (six) hours as needed for migraine. 20 tablet 0  . Cholecalciferol (VITAMIN D) 2000 units CAPS Take 2,000 Units by mouth every morning.     . citalopram (CELEXA) 40 MG tablet Take 40 mg by mouth daily.    . cyanocobalamin (,VITAMIN B-12,) 1000 MCG/ML injection Inject 1,000 mcg into the muscle every 30 (thirty) days.      . diazepam (VALIUM) 5 MG tablet Take 1 tablet (5 mg total) by mouth 2 (two) times daily as needed for anxiety. 6 tablet 0  . esomeprazole (NEXIUM) 40 MG capsule Take 40 mg by mouth daily before breakfast.      . furosemide (LASIX) 40 MG tablet Take 1 tablet (40 mg total) by mouth  daily. (Patient taking differently: Take 40 mg by mouth every morning. ) 30 tablet 0  . hydrALAZINE (APRESOLINE) 100 MG tablet Take 1 tablet (100 mg total) by mouth every 8 (eight) hours. 90 tablet 2  . insulin detemir (LEVEMIR FLEXTOUCH) 100 UNIT/ML FlexPen Inject 20 Units into the skin at bedtime. Patient adjust for her sugars    . Insulin Lispro (HUMALOG KWIKPEN [place]) Inject 20 Units into the skin 3 (three) times daily with meals.     . methocarbamol (ROBAXIN) 500 MG tablet Take 1 tablet (500 mg total) by mouth every 6 (six) hours as needed for muscle spasms. 40 tablet 0  . metoprolol (LOPRESSOR) 100 MG tablet Take 100 mg by mouth 2 (two) times daily.     Marland Kitchen omega-3 acid ethyl esters (LOVAZA) 1 g capsule Take 1 capsule (1 g total) by mouth 2 (two) times daily. 60 capsule 1  . potassium chloride SA (K-DUR,KLOR-CON) 20 MEQ tablet Take 20 mEq by mouth every morning.     . Semaglutide,0.25 or 0.5MG /DOS, (OZEMPIC, 0.25 OR 0.5 MG/DOSE,) 2 MG/1.5ML SOPN Inject 2 mg into the skin every Sunday.     . topiramate (TOPAMAX) 50 MG tablet Take 1 tablet (50 mg total) by mouth 2 (two) times daily. 60 tablet 1  . venlafaxine XR (EFFEXOR-XR) 75 MG 24 hr capsule Take 75 mg by mouth daily.    . zolpidem (AMBIEN) 10 MG tablet Take 10 mg by mouth at bedtime.     No current facility-administered medications for this visit.     Past Surgical History:  Procedure Laterality Date  . BACK SURGERY    . c-spine surgery     06 /2008  . CARDIAC CATHETERIZATION    . CHOLECYSTECTOMY    . COLONOSCOPY  04/21/2011; 05/29/11   6/12: [place] - ?AVM at IC valve, inflammatory changes at [facility] valve [facility]); 7/12 - [facility]; IC valve erosions, look chronic and probably Crohn's per path  . colonoscopy  2017   [place]: random biopsies normal. TI normal  . ESOPHAGOGASTRODUODENOSCOPY  05/29/11   normal  . ESOPHAGOGASTRODUODENOSCOPY N/A 01/21/2016   Dr. Michail Sermon: normal  . EYE SURGERY Bilateral    cataracts removed  .  FEMUR IM NAIL Right 04/10/2018   Procedure: INTRAMEDULLARY (IM) NAIL FEMORAL;  Surgeon: Paralee Cancel, MD;  Location: [facility];  Service: Orthopedics;  Laterality: Right;  . HARDWARE REMOVAL Right 04/10/2018   Procedure: HARDWARE REMOVAL RIGHT DISTAL FEMUR;  Surgeon: Paralee Cancel, MD;  Location: [facility];  Service: Orthopedics;  Laterality: Right;  . IR FLUORO GUIDE CV LINE RIGHT  04/01/2017  . IR US  GUIDE VASC ACCESS RIGHT  04/01/2017  . JOINT REPLACEMENT Right    hip  . OPEN REDUCTION INTERNAL FIXATION (ORIF) DISTAL RADIAL FRACTURE Right 05/28/2019   Procedure: OPEN REDUCTION INTERNAL FIXATION (ORIF) DISTAL RADIAL FRACTURE;  Surgeon: Verner Mould, MD;  Location: [facility];  Service: Orthopedics;  Laterality: Right;  with block 90 minutes  . ORIF FEMUR FRACTURE Right 03/31/2017   Procedure: OPEN REDUCTION INTERNAL FIXATION (ORIF) DISTAL FEMUR FRACTURE;  Surgeon: Paralee Cancel, MD;  Location: [facility];  Service: Orthopedics;  Laterality: Right;  . removal of kidney stone    . Right knee arthroscopic surgery    . TONSILLECTOMY    . TOTAL ABDOMINAL HYSTERECTOMY       Allergies  Allergen Reactions  . Duloxetine Hcl Swelling  . Fluocinolone Swelling  . Acetaminophen Itching and Swelling    Pt states "some swelling"  . Amlodipine Besylate Swelling    Swelling of feet, fluid retention  . Ciprofloxacin Nausea And Vomiting and Swelling    Swelling of face, jaw, and lips  . Hydrocodone-Acetaminophen Itching  . Metformin Other (See Comments)    REACTION: GI UPSET  . Nsaids Other (See Comments)    "HAS BLEEDING ULCERS"  . Quinolones Swelling  . Sulfamethoxazole Swelling    Swelling of feet, legs  . Valsartan Swelling  . Cozaar [Losartan] Swelling and Rash  . Lisinopril Swelling and Rash    Oral swelling, and red streaks on arms/stomach  . Tape Itching and Rash    Paper tape can only be used on this patient      Family History  Problem Relation Age of Onset  . Diabetes Mother   . Colon  cancer Brother        POSSIBLY. Patient states diagnosed at age 19, then later states age 63. unclear and limited historian  . Esophageal cancer Neg Hx   . Rectal cancer Neg Hx   . Stomach cancer Neg Hx      Social History Ms. Dagostino reports that she has never smoked. She has never used smokeless tobacco. Ms. Wrigley reports no history of alcohol use.   Review of Systems CONSTITUTIONAL: No weight loss, fever, chills, weakness or fatigue.  HEENT: Eyes: No visual loss, blurred vision, double vision or yellow sclerae.No hearing loss, sneezing, congestion, runny nose or sore throat.  SKIN: No rash or itching.  CARDIOVASCULAR: per hpi RESPIRATORY: No shortness of breath, cough or sputum.  GASTROINTESTINAL: No anorexia, nausea, vomiting or diarrhea. No abdominal pain or blood.  GENITOURINARY: No burning on urination, no polyuria NEUROLOGICAL: No headache, dizziness, syncope, paralysis, ataxia, numbness or tingling in the extremities. No change in bowel or bladder control.  MUSCULOSKELETAL: No muscle, back pain, joint pain or stiffness.  LYMPHATICS: No enlarged nodes. No history of splenectomy.  PSYCHIATRIC: No history of depression or anxiety.  ENDOCRINOLOGIC: No reports of sweating, cold or heat intolerance. No polyuria or polydipsia.  Marland Kitchen   Physical Examination Today's Vitals   08/22/20 1310  BP: (!) 162/84  Pulse: 82  SpO2: 99%  Weight: 138 lb (62.6 kg)  Height: 5\' 3"  (1.6 m)   Body mass index is 24.45 kg/m.  Gen: resting comfortably, no acute distress HEENT: no scleral icterus, pupils equal round and reactive, no palptable cervical adenopathy,  CV: RRR, no m/r/g, no jvd Resp: Clear to auscultation bilaterally GI: abdomen is soft, non-tender, non-distended, normal bowel sounds, no hepatosplenomegaly MSK: extremities are warm, no edema.  Skin: warm, no rash Neuro:  no  focal deficits Psych: appropriate affect   Diagnostic Studies  Cath 2005: normal  coronaries  05/2013 nuclear stress: no ischemia  04/2017 echo Study Conclusions  - Left ventricle: The cavity size was normal. There was mild concentric hypertrophy. Systolic function was normal. The estimated ejection fraction was in the range of 55% to 60%. Wall motion was normal; there were no regional wall motion abnormalities. Doppler parameters are consistent with abnormal left ventricular relaxation (grade 1 diastolic dysfunction). Borderline evidence of increased filling pressures. - Aortic valve: Trileaflet; mildly thickened leaflets. No significant regurgitation. Mean gradient: 110mm Hg (S). Valve area: 2.38cm^2 (Vmax). - Mitral valve: Mild regurgitation. Valve area by continuity equation (using LVOT flow): 4.83cm^2. - Left atrium: The atrium was mildly dilated. - Right atrium: Central venous pressure: 57mm Hg (est). - Tricuspid valve: Mild regurgitation. - Pulmonary arteries: PA peak pressure: 55mm Hg (S). - Pericardium, extracardiac: There was no pericardial effusion. Impressions:  - Unable to compare with previous study from December 2012. There is mild LVH with LVEF 62-69%, grade 1 diastolic dysfunction and borderline evidence of increased filling pressures. Mild left atrial enlargement. Mild mitral regurgitation. Mild tricuspid regurgitation with PASP 32 mm mercury and normal CVP. Transthoracic echocardiography. M-mode, complete 2D, spectral Doppler, and color Doppler. Height: Height: 157.5cm. Height: 62in. Weight: Weight: 77.1kg. Weight: 169.6lb. Body mass index: BMI: 31.1kg/m^2. Body surface area: BSA: 1.29m^2. Blood pressure: 150/85. Patient status: Outpatient.   11/2016 echo Study Conclusions  - Left ventricle: The cavity size was normal. Wall thickness was increased in a pattern of mild LVH. Systolic function was vigorous. The estimated ejection fraction was in the range of 65% to 70%. Wall  motion was normal; there were no regional wall motion abnormalities. The study is not technically sufficient to allow evaluation of LV diastolic function. - Aortic valve: Mildly calcified annulus. Trileaflet; mildly calcified leaflets. - Mitral valve: Calcified annulus. There was trivial regurgitation. - Right atrium: Central venous pressure (est): 3 mm Hg. - Atrial septum: No defect or patent foramen ovale was identified. - Tricuspid valve: There was trivial regurgitation. - Pulmonary arteries: Systolic pressure could not be accurately estimated. - Pericardium, extracardiac: There was no pericardial effusion.  Impressions:  - Mild LVH with LVEF 65-70%, no regional wall motion abnormalities. Indeterminate diastolic function. Mildly calcified mitral annulus with trivial mitral regurgitation. Mildly calcified aortic valve. Trivial tricuspid regurgitation.  11/2010 cath HEMODYNAMIC DATA: 1. Right atrial pressure 9-11. 2. RV 32/9-12. 3. Pulmonary artery 32/18, mean 24. 4. Pulmonary capillary wedge 17. 5. Aortic 111/60, mean 81. 6. LV 102/17. 7. No gradient pullback across aortic valve. 8. No mitral valve gradient and simultaneous pressure measurements. 9. Superior vena cava saturation 52%. 10.Pulmonary artery saturation 52%. 11.Aortic saturation 92%. 12.Fick cardiac output 4.7 liters per minute. 13.Fick cardiac index 2.6 liters per minute per meter squared. 14.Thermodilution cardiac output 3.9 liters per minute. 15.Thermodilution cardiac index 2.1 liters per minute per meter squared.  ANGIOGRAPHIC DATA: 1. The left main coronary artery is free of critical disease. 2. The LAD courses towards the apex. There are twin vessels down over the anteroseptal, anterolateral segment. One is the diagonal, the other is likely the LAD with multiple septal perforators. This vessel was smooth throughout without significant focal narrowing. 3. The  circumflex provides a large marginal Joylynn Defrancesco and a smaller AV circumflex, both of which appear free of critical disease. 4. The right coronary artery is moderately large vessel providing posterior descending and posterolateral Kalei Mckillop. There may be some minor plaquing at the bifurcation  of the PLA, PDA, but no significant areas of high-grade focal stenosis noted.  CONCLUSION: 1. Borderline elevation of right heart pressures without significant pulmonary hypertension. 2. No evidence of either aortic or mitral valve gradient. 3. Somewhat low saturations in the superior vena cava and pulmonary artery rechecked during the procedure. 4. No significant coronary obstruction.   07/2017 event monitor  Telemetry tracings show normal sinus rhythm and sinus tachycardia  No signficant arrhythmias.  No symptoms reported   Assessment and Plan  1. Chest pain - long history, negative ischemic workups in the past -chronic symptoms unchanged, continue to monitor.    2. HTN - remains elevated, therapy limited by prior medication allergies - stop lopressor, start labetalol 300mg  bid   3. Palpitations -previous benign monitor - changing beta blocker from metoprolol to labetalol for better bp control   4. Hyperlipidemia -repeat labs    Alphonse Guild. Jermiah Soderman,MD

## 2020-08-23 LAB — LIPID PANEL W/O CHOL/HDL RATIO
Cholesterol, Total: 405 mg/dL — ABNORMAL HIGH (ref 100–199)
HDL: 30 mg/dL — ABNORMAL LOW (ref >39)
Triglycerides: 985 mg/dL (ref 0–149)

## 2020-08-23 LAB — LDL CHOLESTEROL, DIRECT: LDL Direct: 129 mg/dL — ABNORMAL HIGH (ref 0–99)

## 2020-08-24 DIAGNOSIS — I509 Heart failure, unspecified: Secondary | ICD-10-CM | POA: Diagnosis not present

## 2020-08-24 DIAGNOSIS — Z299 Encounter for prophylactic measures, unspecified: Secondary | ICD-10-CM | POA: Diagnosis not present

## 2020-08-24 DIAGNOSIS — Z23 Encounter for immunization: Secondary | ICD-10-CM | POA: Diagnosis not present

## 2020-08-24 DIAGNOSIS — R112 Nausea with vomiting, unspecified: Secondary | ICD-10-CM | POA: Diagnosis not present

## 2020-08-24 DIAGNOSIS — R55 Syncope and collapse: Secondary | ICD-10-CM | POA: Diagnosis not present

## 2020-08-26 DIAGNOSIS — E119 Type 2 diabetes mellitus without complications: Secondary | ICD-10-CM | POA: Diagnosis not present

## 2020-08-26 DIAGNOSIS — I10 Essential (primary) hypertension: Secondary | ICD-10-CM | POA: Diagnosis not present

## 2020-09-06 ENCOUNTER — Telehealth: Payer: Self-pay | Admitting: *Deleted

## 2020-09-06 NOTE — Telephone Encounter (Signed)
Pt says she was fasting and had not ate or drank anything 6 hours prior

## 2020-09-06 NOTE — Telephone Encounter (Signed)
-----   Message from Arnoldo Lenis, MD sent at 09/06/2020 10:41 AM EST ----- Very high triglycerides, I see where sampel was collected around 2pm. Can we clarify with patient if this was a true fasting cholesterol test   Zandra Abts MD

## 2020-09-07 MED ORDER — FENOFIBRATE 145 MG PO TABS
145.0000 mg | ORAL_TABLET | Freq: Every day | ORAL | 1 refills | Status: DC
Start: 2020-09-07 — End: 2022-03-17

## 2020-09-07 NOTE — Telephone Encounter (Signed)
Pt voiced understanding - Medication sent to pharmacy ° °

## 2020-09-07 NOTE — Telephone Encounter (Signed)
Very high triglycerides, needs to be very aggressive in limiting sweets, sodas, fried foods. Please start tricor 145mg  daily to help lower her TGs   Zandra Abts MD

## 2020-09-27 DIAGNOSIS — I10 Essential (primary) hypertension: Secondary | ICD-10-CM | POA: Diagnosis not present

## 2020-09-27 DIAGNOSIS — E119 Type 2 diabetes mellitus without complications: Secondary | ICD-10-CM | POA: Diagnosis not present

## 2020-10-03 ENCOUNTER — Emergency Department (HOSPITAL_COMMUNITY)
Admission: EM | Admit: 2020-10-03 | Discharge: 2020-10-03 | Disposition: A | Discharge status: Home / Self Care | Payer: Medicare Other | Attending: Emergency Medicine | Admitting: Emergency Medicine

## 2020-10-03 ENCOUNTER — Other Ambulatory Visit: Payer: Self-pay

## 2020-10-03 ENCOUNTER — Encounter (HOSPITAL_COMMUNITY): Payer: Self-pay | Admitting: *Deleted

## 2020-10-03 DIAGNOSIS — R112 Nausea with vomiting, unspecified: Secondary | ICD-10-CM | POA: Insufficient documentation

## 2020-10-03 DIAGNOSIS — M7989 Other specified soft tissue disorders: Secondary | ICD-10-CM | POA: Insufficient documentation

## 2020-10-03 DIAGNOSIS — Z5321 Procedure and treatment not carried out due to patient leaving prior to being seen by health care provider: Secondary | ICD-10-CM | POA: Diagnosis not present

## 2020-10-03 DIAGNOSIS — R519 Headache, unspecified: Secondary | ICD-10-CM | POA: Insufficient documentation

## 2020-10-03 NOTE — ED Triage Notes (Signed)
Pt c/o facial swelling, bilateral hands swelling and left leg swelling. Pt c/o HA, hyperglycemia.  + nausea, emesis x 4 today.  Swelling started yesterday per pt. ?thrush to tongue per pt.

## 2020-10-04 DIAGNOSIS — E1122 Type 2 diabetes mellitus with diabetic chronic kidney disease: Secondary | ICD-10-CM | POA: Diagnosis not present

## 2020-10-04 DIAGNOSIS — M542 Cervicalgia: Secondary | ICD-10-CM | POA: Diagnosis not present

## 2020-10-04 DIAGNOSIS — R809 Proteinuria, unspecified: Secondary | ICD-10-CM | POA: Diagnosis not present

## 2020-10-04 DIAGNOSIS — E1129 Type 2 diabetes mellitus with other diabetic kidney complication: Secondary | ICD-10-CM | POA: Diagnosis not present

## 2020-10-04 DIAGNOSIS — I5032 Chronic diastolic (congestive) heart failure: Secondary | ICD-10-CM | POA: Diagnosis not present

## 2020-10-04 DIAGNOSIS — J019 Acute sinusitis, unspecified: Secondary | ICD-10-CM | POA: Diagnosis not present

## 2020-10-04 DIAGNOSIS — Z299 Encounter for prophylactic measures, unspecified: Secondary | ICD-10-CM | POA: Diagnosis not present

## 2020-10-04 DIAGNOSIS — I509 Heart failure, unspecified: Secondary | ICD-10-CM | POA: Diagnosis not present

## 2020-10-04 DIAGNOSIS — N189 Chronic kidney disease, unspecified: Secondary | ICD-10-CM | POA: Diagnosis not present

## 2020-10-10 DIAGNOSIS — E785 Hyperlipidemia, unspecified: Secondary | ICD-10-CM | POA: Diagnosis not present

## 2020-10-10 DIAGNOSIS — M25522 Pain in left elbow: Secondary | ICD-10-CM | POA: Diagnosis not present

## 2020-10-10 DIAGNOSIS — Z888 Allergy status to other drugs, medicaments and biological substances status: Secondary | ICD-10-CM | POA: Diagnosis not present

## 2020-10-10 DIAGNOSIS — M109 Gout, unspecified: Secondary | ICD-10-CM | POA: Diagnosis not present

## 2020-10-10 DIAGNOSIS — I129 Hypertensive chronic kidney disease with stage 1 through stage 4 chronic kidney disease, or unspecified chronic kidney disease: Secondary | ICD-10-CM | POA: Diagnosis not present

## 2020-10-10 DIAGNOSIS — E1122 Type 2 diabetes mellitus with diabetic chronic kidney disease: Secondary | ICD-10-CM | POA: Diagnosis not present

## 2020-10-10 DIAGNOSIS — N189 Chronic kidney disease, unspecified: Secondary | ICD-10-CM | POA: Diagnosis not present

## 2020-10-10 DIAGNOSIS — Z882 Allergy status to sulfonamides status: Secondary | ICD-10-CM | POA: Diagnosis not present

## 2020-10-10 DIAGNOSIS — M48061 Spinal stenosis, lumbar region without neurogenic claudication: Secondary | ICD-10-CM | POA: Diagnosis not present

## 2020-10-10 DIAGNOSIS — Z881 Allergy status to other antibiotic agents status: Secondary | ICD-10-CM | POA: Diagnosis not present

## 2020-10-10 DIAGNOSIS — M4807 Spinal stenosis, lumbosacral region: Secondary | ICD-10-CM | POA: Diagnosis not present

## 2020-10-10 DIAGNOSIS — Z96651 Presence of right artificial knee joint: Secondary | ICD-10-CM | POA: Diagnosis not present

## 2020-10-10 DIAGNOSIS — F32A Depression, unspecified: Secondary | ICD-10-CM | POA: Diagnosis not present

## 2020-10-10 DIAGNOSIS — Z7982 Long term (current) use of aspirin: Secondary | ICD-10-CM | POA: Diagnosis not present

## 2020-10-10 DIAGNOSIS — Z794 Long term (current) use of insulin: Secondary | ICD-10-CM | POA: Diagnosis not present

## 2020-10-10 DIAGNOSIS — Z886 Allergy status to analgesic agent status: Secondary | ICD-10-CM | POA: Diagnosis not present

## 2020-10-12 DIAGNOSIS — M5416 Radiculopathy, lumbar region: Secondary | ICD-10-CM | POA: Diagnosis not present

## 2020-10-13 ENCOUNTER — Other Ambulatory Visit (HOSPITAL_COMMUNITY)
Admission: RE | Admit: 2020-10-13 | Discharge: 2020-10-13 | Disposition: A | Discharge status: Home / Self Care | Payer: Medicare Other | Source: Ambulatory Visit | Attending: Nephrology | Admitting: Nephrology

## 2020-10-13 ENCOUNTER — Other Ambulatory Visit: Payer: Self-pay

## 2020-10-13 DIAGNOSIS — E211 Secondary hyperparathyroidism, not elsewhere classified: Secondary | ICD-10-CM | POA: Diagnosis not present

## 2020-10-13 DIAGNOSIS — I5032 Chronic diastolic (congestive) heart failure: Secondary | ICD-10-CM | POA: Diagnosis not present

## 2020-10-13 DIAGNOSIS — R809 Proteinuria, unspecified: Secondary | ICD-10-CM | POA: Insufficient documentation

## 2020-10-13 DIAGNOSIS — E1129 Type 2 diabetes mellitus with other diabetic kidney complication: Secondary | ICD-10-CM | POA: Insufficient documentation

## 2020-10-13 DIAGNOSIS — N189 Chronic kidney disease, unspecified: Secondary | ICD-10-CM | POA: Diagnosis not present

## 2020-10-13 DIAGNOSIS — I129 Hypertensive chronic kidney disease with stage 1 through stage 4 chronic kidney disease, or unspecified chronic kidney disease: Secondary | ICD-10-CM | POA: Insufficient documentation

## 2020-10-13 DIAGNOSIS — E559 Vitamin D deficiency, unspecified: Secondary | ICD-10-CM | POA: Insufficient documentation

## 2020-10-13 DIAGNOSIS — M109 Gout, unspecified: Secondary | ICD-10-CM | POA: Insufficient documentation

## 2020-10-13 DIAGNOSIS — E1122 Type 2 diabetes mellitus with diabetic chronic kidney disease: Secondary | ICD-10-CM | POA: Diagnosis not present

## 2020-10-13 LAB — CBC
HCT: 26 % — ABNORMAL LOW (ref 36.0–46.0)
Hemoglobin: 8.5 g/dL — ABNORMAL LOW (ref 12.0–15.0)
MCH: 31.5 pg (ref 26.0–34.0)
MCHC: 32.7 g/dL (ref 30.0–36.0)
MCV: 96.3 fL (ref 80.0–100.0)
Platelets: 239 10*3/uL (ref 150–400)
RBC: 2.7 MIL/uL — ABNORMAL LOW (ref 3.87–5.11)
RDW: 14.8 % (ref 11.5–15.5)
WBC: 8.8 10*3/uL (ref 4.0–10.5)
nRBC: 0 % (ref 0.0–0.2)

## 2020-10-17 ENCOUNTER — Encounter: Payer: Self-pay | Admitting: Internal Medicine

## 2020-10-17 ENCOUNTER — Ambulatory Visit (INDEPENDENT_AMBULATORY_CARE_PROVIDER_SITE_OTHER): Payer: Medicare Other | Admitting: Internal Medicine

## 2020-10-17 ENCOUNTER — Other Ambulatory Visit (INDEPENDENT_AMBULATORY_CARE_PROVIDER_SITE_OTHER): Payer: Medicare Other

## 2020-10-17 VITALS — BP 130/50 | HR 88 | Ht 62.0 in | Wt 149.5 lb

## 2020-10-17 DIAGNOSIS — IMO0002 Reserved for concepts with insufficient information to code with codable children: Secondary | ICD-10-CM

## 2020-10-17 DIAGNOSIS — R710 Precipitous drop in hematocrit: Secondary | ICD-10-CM

## 2020-10-17 DIAGNOSIS — R195 Other fecal abnormalities: Secondary | ICD-10-CM

## 2020-10-17 DIAGNOSIS — E1165 Type 2 diabetes mellitus with hyperglycemia: Secondary | ICD-10-CM

## 2020-10-17 DIAGNOSIS — E1129 Type 2 diabetes mellitus with other diabetic kidney complication: Secondary | ICD-10-CM

## 2020-10-17 DIAGNOSIS — E1122 Type 2 diabetes mellitus with diabetic chronic kidney disease: Secondary | ICD-10-CM

## 2020-10-17 DIAGNOSIS — R1013 Epigastric pain: Secondary | ICD-10-CM | POA: Diagnosis not present

## 2020-10-17 DIAGNOSIS — G8929 Other chronic pain: Secondary | ICD-10-CM

## 2020-10-17 DIAGNOSIS — N183 Chronic kidney disease, stage 3 unspecified: Secondary | ICD-10-CM

## 2020-10-17 DIAGNOSIS — R739 Hyperglycemia, unspecified: Secondary | ICD-10-CM

## 2020-10-17 DIAGNOSIS — K529 Noninfective gastroenteritis and colitis, unspecified: Secondary | ICD-10-CM

## 2020-10-17 LAB — CBC WITH DIFFERENTIAL/PLATELET
Basophils Absolute: 0.1 10*3/uL (ref 0.0–0.1)
Basophils Relative: 0.7 % (ref 0.0–3.0)
Eosinophils Absolute: 0.2 10*3/uL (ref 0.0–0.7)
Eosinophils Relative: 1.1 % (ref 0.0–5.0)
HCT: 27.8 % — ABNORMAL LOW (ref 36.0–46.0)
Hemoglobin: 9.2 g/dL — ABNORMAL LOW (ref 12.0–15.0)
Lymphocytes Relative: 15.8 % (ref 12.0–46.0)
Lymphs Abs: 3.2 10*3/uL (ref 0.7–4.0)
MCHC: 33 g/dL (ref 30.0–36.0)
MCV: 93.9 fl (ref 78.0–100.0)
Monocytes Absolute: 1.3 10*3/uL — ABNORMAL HIGH (ref 0.1–1.0)
Monocytes Relative: 6.6 % (ref 3.0–12.0)
Neutro Abs: 15.2 10*3/uL — ABNORMAL HIGH (ref 1.4–7.7)
Neutrophils Relative %: 75.8 % (ref 43.0–77.0)
Platelets: 458 10*3/uL — ABNORMAL HIGH (ref 150.0–400.0)
RBC: 2.96 Mil/uL — ABNORMAL LOW (ref 3.87–5.11)
RDW: 16.3 % — ABNORMAL HIGH (ref 11.5–15.5)
WBC: 20.1 10*3/uL (ref 4.0–10.5)

## 2020-10-17 LAB — BASIC METABOLIC PANEL
BUN: 39 mg/dL — ABNORMAL HIGH (ref 6–23)
CO2: 31 mEq/L (ref 19–32)
Calcium: 9.3 mg/dL (ref 8.4–10.5)
Chloride: 98 mEq/L (ref 96–112)
Creatinine, Ser: 1.68 mg/dL — ABNORMAL HIGH (ref 0.40–1.20)
GFR: 31.12 mL/min — ABNORMAL LOW (ref >60.00)
Glucose, Bld: 47 mg/dL — CL (ref 70–99)
Potassium: 3 mEq/L — ABNORMAL LOW (ref 3.5–5.1)
Sodium: 141 mEq/L (ref 135–145)

## 2020-10-17 LAB — FERRITIN: Ferritin: 513.5 ng/mL — ABNORMAL HIGH (ref 10.0–291.0)

## 2020-10-17 LAB — VITAMIN B12: Vitamin B-12: 1084 pg/mL — ABNORMAL HIGH (ref 211–911)

## 2020-10-17 NOTE — Progress Notes (Signed)
PRUDIE GUTHRIDGE 69 y.o. 1950-12-06 [id number]  Assessment & Plan:   Encounter Diagnoses  Name Primary?  . Decreased hemoglobin Yes  . Heme + stool   . CKD stage 3 due to type 2 diabetes mellitus ([place])   . Type 2 diabetes, uncontrolled, with renal manifestation ([place])   . Abdominal pain, chronic, epigastric   . Chronic diarrhea   . Hyperglycemia      I wonder if the decreased hemoglobin is not related to her chronic kidney disease.  She does have a heme positive stool but she had an EGD and a colonoscopy this year they were unremarkable.  Consider small bowel capsule study but await repeat lab testing. I am a little surprised that Creon is helping her but it is worth increasing to see if it makes things better.  I had not thought she had steatorrhea in the past.  She certainly could have some pancreatic insufficiency I suppose but I suspect her diarrhea is multifactorial. I will sample her with 36 extra capsules to see if increasing to 2 with meals makes a difference. Consider the addition of cholestyramine or similar agent most likely with her evening meal.  Orders Placed This Encounter  Procedures  . Vitamin B12  . Ferritin  . Basic metabolic panel  . CBC with Differential/Platelet    We will notify after labs are in.  If she is iron deficient would pursue capsule endoscopy.  I appreciate the opportunity to care for this patient. CC: [name], MD  Dr. Jacquenette Shone kidney Subjective:   Chief Complaint: Decreased hemoglobin  HPI [name] is a 69 year old woman with chronic diarrhea thought to be irritable bowel syndrome and some chronic abdominal pain as well who was asked to see me by her nephrologist due to a decline in hemoglobin.  She has had some worsening of kidney disease and was seeing Dr. Theador Hawthorne of [facility].  I believe he noticed a mild decline in hemoglobin and had it rechecked and on December 16 her hemoglobin was 8.5  it was 11.45 months ago.  It was 12.2 in September.  MCV is normal.  Kidney function has gone from a GFR in December of last year of 39 then 28 in May of this year 4136 and 31 in December.  Glucose was 313 on 10/04/2020.  She has not really noticed any bleeding.  Her white count and platelets are normal.  Her abdominal pain and diarrhea are unchanged.  She was placed on Creon by [name] her primary care provider at 36,000 units with meals and she thinks that is somewhat helpful.  She is never really had CT had a real problem suggestive of that necessarily.  Stools remain watery perhaps a little bit formed and she still has fecal incontinence and nocturnal soiling.  She is unaware of defecation and wears diapers.  She is complaining of some ankle edema as well.  Please see HPI of previous visits this year below  She was diagnosed with gout and treated with prednisone and is almost finished a prednisone taper.  03/10/20 History of Present Illness: is a 69 y.o.femalewith pastmedical history significant for CKD, anxiety, anemia, depression,diabetes, hypertension, CKD, prepyloric ulcer 2012 and diarrhea predominant IBS.  Past cholecystectomy.  He presents today with complaints of having lower abdominal pain which started 1 week ago.  She has chronic diarrhea which she reports has been on and off for the past few years.  She is passing anywhere from  5-15 nonbloody diarrhea bowel movements daily.  She is able to pass a solid stool 2 days monthly.  No rectal bleeding or melena.  Her stool color is sometimes yellow or green. She has fecal incontinence 3 times monthly.  She has nausea for the past 2 weeks.  She vomited partially undigested food 2 weeks ago.  Since then she has vomited water-like secretions on a few occasions.  No hematemesis. The smell of food causes nausea.  No specific food triggers.  She drinks milk and eats sliced cheese most days.  She occasionally eats ice cream.  If she drinks water this will  trigger diarrhea at times.  She was recently seen by her nephrologist and laboratory studies were completed.  She received a call from her nephrologist stating her labs were abnormal and recommended for the patient to present to the ED for further evaluation.  He presented to [facility] on 03/02/2020.  In the ED, she complained of lower abdominal pain and diarrhea.  Labs in the ED showed hemoglobin 11.6.  Potassium 3.3.  BUN 24.  Creatinine 1.37.  Urine protein 100 and leukocyte trace.  An abdominal/pelvic CT showed the small bowel and proximal colon were fluid-filled with stool in the distal colon and diverticulosis without evidence of acute diverticulitis.  C. difficile was negative.  GI pathogen panel negative.  She underwent an EGD 02/08/2020 which was normal.  Most recent colonoscopy was 08/29/2016 was normal, no evidence of colitis.  History of ulceration at the IC valve in 2012 with possible small bowel Crohn's disease.  See further procedure results below. She has lost 5 lbs over the past year. She is taking ASA 81mg  daily. Indocin is on her medication list, she stated she is not taking. No other NSAID use.    08/06/2020 [name] is here with persistent watery diarrhea problems and generalized abdominal cramping associated with defecation.  Recall that in the spring she was in any pain and there was a Salmonella positive stool test, she was treated for that but continued to have watery diarrhea so I rechecked a GI pathogen panel it was negative.  I tried to get a pancreatic elastase test done but the lab submission was incorrect, i.e. wrong tube or not the right tubes.  She has a frequent watery diarrhea that has been unresponsive to multiple treatments.  She has had negative random colon biopsies.  She does not have celiac disease.  In 2012 she had patchy chronic inflammation in the she might have IBD or microscopic changes but on a subsequent colonoscopy and repeat biopsy in 2017 biopsies were all  negative.  She has failed Lotronex.  She has failed other antidiarrheals.  We tried Zen Pep it did not help.  She is now losing weight.  Though I will say it is stable over the last 2 months he thinks everything we have tried makes her feel worse.  She is also been on cholestyramine and Imodium Lomotil. Wt Readings from Last 3 Encounters:  10/17/20 149 lb 8 oz (67.8 kg)  10/03/20 135 lb (61.2 kg)  08/22/20 138 lb (62.6 kg)   Colonoscopy May 2021 normal including terminal ileum and random biopsies of the colon Normal EGD April 2021 Allergies  Allergen Reactions  . Duloxetine Hcl Swelling  . Fluocinolone Swelling  . Acetaminophen Itching and Swelling    Pt states "some swelling"  . Amlodipine Besylate Swelling    Swelling of feet, fluid retention  . Ciprofloxacin Nausea And Vomiting and  Swelling    Swelling of face, jaw, and lips  . Hydrocodone-Acetaminophen Itching  . Metformin Other (See Comments)    REACTION: GI UPSET  . Nsaids Other (See Comments)    "HAS BLEEDING ULCERS"  . Quinolones Swelling  . Sulfamethoxazole Swelling    Swelling of feet, legs  . Valsartan Swelling  . Cozaar [Losartan] Swelling and Rash  . Lisinopril Swelling and Rash    Oral swelling, and red streaks on arms/stomach  . Tape Itching and Rash    Paper tape can only be used on this patient   Current Meds  Medication Sig  . allopurinol (ZYLOPRIM) 100 MG tablet Take 100 mg by mouth daily.  Marland Kitchen amLODipine (NORVASC) 5 MG tablet Take 5 mg by mouth daily.  . Ascorbic Acid (VITAMIN C WITH ROSE HIPS) 500 MG tablet Take 500 mg by mouth every morning.  Marland Kitchen aspirin EC 81 MG tablet Take 81 mg by mouth every morning.   Marland Kitchen atorvastatin (LIPITOR) 80 MG tablet Take 80 mg by mouth every morning.  . butalbital-acetaminophen-caffeine (FIORICET) 50-325-40 MG tablet Take 1 tablet by mouth every 6 (six) hours as needed for migraine.  . calcium carbonate (OS-CAL) 1250 (500 Ca) MG chewable tablet Chew 2 tablets by mouth daily.  .  Cholecalciferol (VITAMIN D) 2000 units CAPS Take 2,000 Units by mouth every morning.   . citalopram (CELEXA) 40 MG tablet Take 40 mg by mouth daily.  Marland Kitchen CREON 36000-114000 units CPEP capsule Take 36,000 Units by mouth 3 (three) times daily.  . cyanocobalamin (,VITAMIN B-12,) 1000 MCG/ML injection Inject 1,000 mcg into the muscle every 30 (thirty) days.    Marland Kitchen esomeprazole (NEXIUM) 40 MG capsule Take 40 mg by mouth daily before breakfast.    . fenofibrate (TRICOR) 145 MG tablet Take 1 tablet (145 mg total) by mouth daily.  . furosemide (LASIX) 40 MG tablet Take 1 tablet (40 mg total) by mouth daily. (Patient taking differently: Take 40 mg by mouth 2 (two) times daily.)  . hydrALAZINE (APRESOLINE) 100 MG tablet Take 1 tablet (100 mg total) by mouth every 8 (eight) hours. (Patient taking differently: Take 100 mg by mouth 3 (three) times daily.)  . hydrALAZINE (APRESOLINE) 50 MG tablet Take 50 mg by mouth 3 (three) times daily.  . indomethacin (INDOCIN) 50 MG capsule Take 50 mg by mouth 3 (three) times daily.  . insulin detemir (LEVEMIR FLEXTOUCH) 100 UNIT/ML FlexPen Inject 20 Units into the skin at bedtime. Patient adjust for her sugars  . Insulin Lispro (HUMALOG KWIKPEN ) Inject 20 Units into the skin 3 (three) times daily with meals.   Marland Kitchen labetalol (NORMODYNE) 300 MG tablet Take 1 tablet (300 mg total) by mouth 2 (two) times daily.  . methocarbamol (ROBAXIN) 500 MG tablet Take 1 tablet (500 mg total) by mouth every 6 (six) hours as needed for muscle spasms.  . metoprolol tartrate (LOPRESSOR) 100 MG tablet Take 100 mg by mouth 2 (two) times daily.  Marland Kitchen nystatin (MYCOSTATIN) 100000 UNIT/ML suspension Take 20 mLs by mouth as needed.  Marland Kitchen omega-3 acid ethyl esters (LOVAZA) 1 g capsule Take 2 capsules by mouth 2 (two) times daily.  . ondansetron (ZOFRAN) 4 MG tablet Take by mouth as needed.  . potassium chloride SA (K-DUR,KLOR-CON) 20 MEQ tablet Take 20 mEq by mouth every morning.   . predniSONE (DELTASONE)  10 MG tablet Take by mouth.  . promethazine (PHENERGAN) 25 MG tablet Take 25 mg by mouth as needed.  . Semaglutide,0.25 or 0.5MG /DOS, (  OZEMPIC, 0.25 OR 0.5 MG/DOSE,) 2 MG/1.5ML SOPN Inject 2 mg into the skin every Sunday.   . sodium bicarbonate 650 MG tablet Take 650 mg by mouth 2 (two) times daily.  . tiZANidine (ZANAFLEX) 4 MG tablet Take 8 mg by mouth at bedtime.  . zolpidem (AMBIEN) 10 MG tablet Take 10 mg by mouth at bedtime.   Past Medical History:  Diagnosis Date  . Acute renal failure superimposed on stage 3 chronic kidney disease (HCC) 01/21/2016  . Anemia   . Anxiety   . B12 deficiency   . Cellulitis and abscess    Abdomen and buttocks  . CKD (chronic kidney disease) stage 3, GFR 30-59 ml/min (HCC)   . Closed fracture of distal end of femur, unspecified fracture morphology, initial encounter (HCC)   . Depression   . Diabetic neuropathy (HCC)    feet  . DJD (degenerative joint disease)    Right forminal stenosis C4-5  . Essential hypertension   . Femur fracture, right (HCC) 03/30/2017  . Folliculitis   . GERD (gastroesophageal reflux disease)   . Gout   . Headache(784.0)   . Hiatal hernia   . History of blood transfusion   . History of bronchitis   . History of cardiac catheterization    No significant CAD 2012  . History of kidney stones    surgery to remove  . Insomnia   . Irritable bowel syndrome   . Mixed hyperlipidemia   . Osteoarthritis   . Palpitations   . Reflux esophagitis   . Tinnitus    Right  . Type 2 diabetes mellitus (HCC)    Past Surgical History:  Procedure Laterality Date  . BACK SURGERY    . c-spine surgery     06 /2008  . CARDIAC CATHETERIZATION    . CHOLECYSTECTOMY    . COLONOSCOPY  04/21/2011; 05/29/11   6/12: [place] - ?AVM at IC valve, inflammatory changes at [facility] valve [facility]); 7/12 - [facility]; IC valve erosions, look chronic and probably Crohn's per path  . colonoscopy  2017   [place]: random biopsies normal. TI normal   . ESOPHAGOGASTRODUODENOSCOPY  05/29/11   normal  . ESOPHAGOGASTRODUODENOSCOPY N/A 01/21/2016   Dr. Michail Sermon: normal  . EYE SURGERY Bilateral    cataracts removed  . FEMUR IM NAIL Right 04/10/2018   Procedure: INTRAMEDULLARY (IM) NAIL FEMORAL;  Surgeon: Paralee Cancel, MD;  Location: [facility];  Service: Orthopedics;  Laterality: Right;  . HARDWARE REMOVAL Right 04/10/2018   Procedure: HARDWARE REMOVAL RIGHT DISTAL FEMUR;  Surgeon: Paralee Cancel, MD;  Location: [facility];  Service: Orthopedics;  Laterality: Right;  . IR FLUORO GUIDE CV LINE RIGHT  04/01/2017  . IR US GUIDE VASC ACCESS RIGHT  04/01/2017  . JOINT REPLACEMENT Right    hip  . OPEN REDUCTION INTERNAL FIXATION (ORIF) DISTAL RADIAL FRACTURE Right 05/28/2019   Procedure: OPEN REDUCTION INTERNAL FIXATION (ORIF) DISTAL RADIAL FRACTURE;  Surgeon: Verner Mould, MD;  Location: [place];  Service: Orthopedics;  Laterality: Right;  with block 90 minutes  . ORIF FEMUR FRACTURE Right 03/31/2017   Procedure: OPEN REDUCTION INTERNAL FIXATION (ORIF) DISTAL FEMUR FRACTURE;  Surgeon: Paralee Cancel, MD;  Location: [place];  Service: Orthopedics;  Laterality: Right;  . removal of kidney stone    . Right knee arthroscopic surgery    . TONSILLECTOMY    . 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   Review of Systems As per HPI  Objective:   Physical Exam BP (!) 130/50 (BP Location: Left Arm, Patient Position: Sitting, Cuff Size: Normal)   Pulse 88   Ht 5\' 2"  (1.575 m)   Wt 149 lb 8 oz (67.8 kg)   LMP  (LMP Unknown)   BMI 27.34 kg/m  NAD  Sl pale  Lungs cta cor Nl abd mild ep teder  Dorisann Frames, CMA present  Brown  stool at anus - heme +

## 2020-10-17 NOTE — Patient Instructions (Signed)
Your provider has requested that you go to the basement level for lab work before leaving today. Press "B" on the elevator. The lab is located at the first door on the left as you exit the elevator.  Due to recent changes in healthcare laws, you may see the results of your imaging and laboratory studies on MyChart before your provider has had a chance to review them.  We understand that in some cases there may be results that are confusing or concerning to you. Not all laboratory results come back in the same time frame and the provider may be waiting for multiple results in order to interpret others.  Please give [place] 48 hours in order for your provider to thoroughly review all the results before contacting the office for clarification of your results.   Please increase your Creon to 2 capsules with meals. Samples provided.   I appreciate the opportunity to care for you. Silvano Rusk, MD, [facility]

## 2020-10-19 ENCOUNTER — Telehealth: Payer: Self-pay | Admitting: Internal Medicine

## 2020-10-19 DIAGNOSIS — R710 Precipitous drop in hematocrit: Secondary | ICD-10-CM

## 2020-10-19 MED ORDER — DIPHENOXYLATE-ATROPINE 2.5-0.025 MG PO TABS: ORAL_TABLET | ORAL | 1 refills | Status: DC

## 2020-10-19 MED ORDER — HYDROCORTISONE 2.5 % EX CREA: TOPICAL_CREAM | Freq: Two times a day (BID) | CUTANEOUS | 0 refills | Status: DC

## 2020-10-19 NOTE — Telephone Encounter (Signed)
Patient reports that she is seeing blood around the stool and on the tissue with her BM.  She was not sure what the next steps were.  Red blood started yesterday.

## 2020-10-19 NOTE — Telephone Encounter (Signed)
Patient notified She will call back to set up follow up She will come for labs on Monday

## 2020-10-19 NOTE — Telephone Encounter (Signed)
I sent in [facility] cream to use - suspect anorectal bleeding from the diarrhea she has  Have her come back for a CBC on Monday 12/27 re: decreased Hgb  She has increased Creon to 2-36K capsules w/ meals and we are trying to see if that helps  I will send in Lomotil Rx also  Please go ahead and make a next available F/U also if not on books

## 2020-10-19 NOTE — Telephone Encounter (Signed)
Pt is requesting a call back from a nurse to discuss her continuous blood in her stools. Pt has not improved since seeing Dr Carlean Purl 12/20.

## 2020-10-24 ENCOUNTER — Other Ambulatory Visit (INDEPENDENT_AMBULATORY_CARE_PROVIDER_SITE_OTHER): Payer: Medicare Other

## 2020-10-24 DIAGNOSIS — R710 Precipitous drop in hematocrit: Secondary | ICD-10-CM | POA: Diagnosis not present

## 2020-10-24 LAB — CBC
HCT: 24.6 % — ABNORMAL LOW (ref 36.0–46.0)
Hemoglobin: 8.4 g/dL — ABNORMAL LOW (ref 12.0–15.0)
MCHC: 33.3 g/dL (ref 30.0–36.0)
MCV: 95.7 fl (ref 78.0–100.0)
Platelets: 220 10*3/uL (ref 150.0–400.0)
RBC: 2.62 Mil/uL — ABNORMAL LOW (ref 3.87–5.11)
RDW: 16.3 % — ABNORMAL HIGH (ref 11.5–15.5)
WBC: 10.3 10*3/uL (ref 4.0–10.5)

## 2020-10-27 DIAGNOSIS — I10 Essential (primary) hypertension: Secondary | ICD-10-CM | POA: Diagnosis not present

## 2020-10-27 DIAGNOSIS — E119 Type 2 diabetes mellitus without complications: Secondary | ICD-10-CM | POA: Diagnosis not present

## 2020-10-29 DIAGNOSIS — D332 Benign neoplasm of brain, unspecified: Secondary | ICD-10-CM

## 2020-10-29 HISTORY — DX: Benign neoplasm of brain, unspecified: D33.2

## 2020-11-02 DIAGNOSIS — R519 Headache, unspecified: Secondary | ICD-10-CM | POA: Diagnosis not present

## 2020-11-04 ENCOUNTER — Encounter (HOSPITAL_COMMUNITY): Payer: Self-pay

## 2020-11-04 ENCOUNTER — Telehealth: Payer: Self-pay | Admitting: Internal Medicine

## 2020-11-04 ENCOUNTER — Emergency Department (HOSPITAL_COMMUNITY): Payer: Medicare Other

## 2020-11-04 ENCOUNTER — Emergency Department (HOSPITAL_COMMUNITY)
Admission: EM | Admit: 2020-11-04 | Discharge: 2020-11-04 | Disposition: A | Discharge status: Home / Self Care | Payer: Medicare Other | Attending: Emergency Medicine | Admitting: Emergency Medicine

## 2020-11-04 ENCOUNTER — Other Ambulatory Visit: Payer: Self-pay

## 2020-11-04 DIAGNOSIS — R29818 Other symptoms and signs involving the nervous system: Secondary | ICD-10-CM | POA: Diagnosis not present

## 2020-11-04 DIAGNOSIS — I10 Essential (primary) hypertension: Secondary | ICD-10-CM | POA: Diagnosis not present

## 2020-11-04 DIAGNOSIS — R197 Diarrhea, unspecified: Secondary | ICD-10-CM | POA: Diagnosis not present

## 2020-11-04 DIAGNOSIS — I5032 Chronic diastolic (congestive) heart failure: Secondary | ICD-10-CM | POA: Diagnosis not present

## 2020-11-04 DIAGNOSIS — N179 Acute kidney failure, unspecified: Secondary | ICD-10-CM | POA: Diagnosis not present

## 2020-11-04 DIAGNOSIS — N184 Chronic kidney disease, stage 4 (severe): Secondary | ICD-10-CM | POA: Diagnosis not present

## 2020-11-04 DIAGNOSIS — R109 Unspecified abdominal pain: Secondary | ICD-10-CM | POA: Diagnosis not present

## 2020-11-04 DIAGNOSIS — Z20822 Contact with and (suspected) exposure to covid-19: Secondary | ICD-10-CM | POA: Insufficient documentation

## 2020-11-04 DIAGNOSIS — R1110 Vomiting, unspecified: Secondary | ICD-10-CM | POA: Diagnosis not present

## 2020-11-04 DIAGNOSIS — K529 Noninfective gastroenteritis and colitis, unspecified: Secondary | ICD-10-CM

## 2020-11-04 DIAGNOSIS — D519 Vitamin B12 deficiency anemia, unspecified: Secondary | ICD-10-CM | POA: Diagnosis not present

## 2020-11-04 DIAGNOSIS — Z96641 Presence of right artificial hip joint: Secondary | ICD-10-CM | POA: Insufficient documentation

## 2020-11-04 DIAGNOSIS — Z7982 Long term (current) use of aspirin: Secondary | ICD-10-CM | POA: Insufficient documentation

## 2020-11-04 DIAGNOSIS — I129 Hypertensive chronic kidney disease with stage 1 through stage 4 chronic kidney disease, or unspecified chronic kidney disease: Secondary | ICD-10-CM | POA: Diagnosis not present

## 2020-11-04 DIAGNOSIS — Z79899 Other long term (current) drug therapy: Secondary | ICD-10-CM | POA: Diagnosis not present

## 2020-11-04 DIAGNOSIS — Z794 Long term (current) use of insulin: Secondary | ICD-10-CM | POA: Insufficient documentation

## 2020-11-04 DIAGNOSIS — Z853 Personal history of malignant neoplasm of breast: Secondary | ICD-10-CM | POA: Insufficient documentation

## 2020-11-04 DIAGNOSIS — E1122 Type 2 diabetes mellitus with diabetic chronic kidney disease: Secondary | ICD-10-CM | POA: Diagnosis not present

## 2020-11-04 DIAGNOSIS — N189 Chronic kidney disease, unspecified: Secondary | ICD-10-CM | POA: Diagnosis not present

## 2020-11-04 DIAGNOSIS — R42 Dizziness and giddiness: Secondary | ICD-10-CM | POA: Diagnosis not present

## 2020-11-04 DIAGNOSIS — N183 Chronic kidney disease, stage 3 unspecified: Secondary | ICD-10-CM | POA: Diagnosis not present

## 2020-11-04 DIAGNOSIS — E119 Type 2 diabetes mellitus without complications: Secondary | ICD-10-CM

## 2020-11-04 DIAGNOSIS — I700 Atherosclerosis of aorta: Secondary | ICD-10-CM | POA: Diagnosis not present

## 2020-11-04 DIAGNOSIS — R195 Other fecal abnormalities: Secondary | ICD-10-CM

## 2020-11-04 DIAGNOSIS — E1129 Type 2 diabetes mellitus with other diabetic kidney complication: Secondary | ICD-10-CM | POA: Diagnosis not present

## 2020-11-04 DIAGNOSIS — R809 Proteinuria, unspecified: Secondary | ICD-10-CM | POA: Diagnosis not present

## 2020-11-04 DIAGNOSIS — Z8719 Personal history of other diseases of the digestive system: Secondary | ICD-10-CM | POA: Diagnosis not present

## 2020-11-04 LAB — CBC
HCT: 28.3 % — ABNORMAL LOW (ref 36.0–46.0)
Hemoglobin: 9 g/dL — ABNORMAL LOW (ref 12.0–15.0)
MCH: 31.8 pg (ref 26.0–34.0)
MCHC: 31.8 g/dL (ref 30.0–36.0)
MCV: 100 fL (ref 80.0–100.0)
Platelets: 228 10*3/uL (ref 150–400)
RBC: 2.83 MIL/uL — ABNORMAL LOW (ref 3.87–5.11)
RDW: 14.5 % (ref 11.5–15.5)
WBC: 8.2 10*3/uL (ref 4.0–10.5)
nRBC: 0 % (ref 0.0–0.2)

## 2020-11-04 LAB — COMPREHENSIVE METABOLIC PANEL
ALT: 16 U/L (ref 0–44)
AST: 17 U/L (ref 15–41)
Albumin: 3.8 g/dL (ref 3.5–5.0)
Alkaline Phosphatase: 82 U/L (ref 38–126)
Anion gap: 11 (ref 5–15)
BUN: 44 mg/dL — ABNORMAL HIGH (ref 8–23)
CO2: 22 mmol/L (ref 22–32)
Calcium: 7.7 mg/dL — ABNORMAL LOW (ref 8.9–10.3)
Chloride: 98 mmol/L (ref 98–111)
Creatinine, Ser: 2.42 mg/dL — ABNORMAL HIGH (ref 0.44–1.00)
GFR, Estimated: 21 mL/min — ABNORMAL LOW (ref >60)
Glucose, Bld: 305 mg/dL — ABNORMAL HIGH (ref 70–99)
Potassium: 4.2 mmol/L (ref 3.5–5.1)
Sodium: 131 mmol/L — ABNORMAL LOW (ref 135–145)
Total Bilirubin: 0.4 mg/dL (ref 0.3–1.2)
Total Protein: 7.3 g/dL (ref 6.5–8.1)

## 2020-11-04 LAB — RESP PANEL BY RT-PCR (FLU A&B, COVID) ARPGX2
Influenza A by PCR: NEGATIVE
Influenza B by PCR: NEGATIVE
SARS Coronavirus 2 by RT PCR: NEGATIVE

## 2020-11-04 LAB — LIPASE, BLOOD: Lipase: 39 U/L (ref 11–51)

## 2020-11-04 MED ORDER — DICYCLOMINE HCL 20 MG PO TABS: 20.0000 mg | ORAL_TABLET | Freq: Four times a day (QID) | ORAL | 3 refills | Status: DC

## 2020-11-04 MED ORDER — SODIUM CHLORIDE 0.9 % IV SOLN: INTRAVENOUS | Status: DC

## 2020-11-04 MED ORDER — ONDANSETRON HCL 4 MG/2ML IJ SOLN
4.0000 mg | Freq: Once | INTRAMUSCULAR | Status: AC
  Administered 2020-11-04: 4 mg via INTRAVENOUS
  Filled 2020-11-04: qty 2

## 2020-11-04 MED ORDER — ONDANSETRON 4 MG PO TBDP: 4.0000 mg | ORAL_TABLET | Freq: Three times a day (TID) | ORAL | 1 refills | Status: DC | PRN

## 2020-11-04 MED ORDER — HYDROMORPHONE HCL 1 MG/ML IJ SOLN
0.5000 mg | Freq: Once | INTRAMUSCULAR | Status: AC
Start: 2020-11-04 — End: 2020-11-04
  Administered 2020-11-04: 0.5 mg via INTRAVENOUS

## 2020-11-04 MED ORDER — CREON 36000-114000 UNITS PO CPEP: 72000.0000 [IU] | ORAL_CAPSULE | Freq: Three times a day (TID) | ORAL | 1 refills | Status: DC

## 2020-11-04 MED ORDER — ONDANSETRON HCL 4 MG/2ML IJ SOLN
4.0000 mg | Freq: Once | INTRAMUSCULAR | Status: AC | PRN
  Administered 2020-11-04: 4 mg via INTRAVENOUS
  Filled 2020-11-04: qty 2

## 2020-11-04 MED ORDER — HYDROMORPHONE HCL 1 MG/ML IJ SOLN
0.5000 mg | Freq: Once | INTRAMUSCULAR | Status: AC
  Administered 2020-11-04: 0.5 mg via INTRAVENOUS
  Filled 2020-11-04: qty 1

## 2020-11-04 MED ORDER — HYDROMORPHONE HCL 1 MG/ML IJ SOLN
0.5000 mg | Freq: Once | INTRAMUSCULAR | Status: DC
  Filled 2020-11-04: qty 1

## 2020-11-04 NOTE — Consult Note (Signed)
Patient Demographics  Heather [name], is a 70 y.o. female   MRN: [id number]   DOB - [birth year]  Admit Date - 11/04/2020    Outpatient Primary MD for the patient is [name], MD  Consult requested in the Hospital by Fredia Sorrow, MD, On 11/04/2020    Reason for consult : Recommendation regarding hypertension and AKI.  With History of -  Past Medical History:  Diagnosis Date  . Acute renal failure superimposed on stage 3 chronic kidney disease ([place]) 01/21/2016  . Anemia   . Anxiety   . B12 deficiency   . Brain tumor (benign) ([place]) 10/29/2020  . Cellulitis and abscess    Abdomen and buttocks  . CKD (chronic kidney disease) stage 3, GFR 30-59 ml/min (HCC)   . Closed fracture of distal end of femur, unspecified fracture morphology, initial encounter ([place])   . Depression   . Diabetic neuropathy (HCC)    feet  . DJD (degenerative joint disease)    Right forminal stenosis C4-5  . Essential hypertension   . Femur fracture, right ([place]) 03/30/2017  . Folliculitis   . GERD (gastroesophageal reflux disease)   . Gout   . Headache(784.0)   . Hiatal hernia   . History of blood transfusion   . History of bronchitis   . History of cardiac catheterization    No significant CAD 2012  . History of kidney stones    surgery to remove  . Insomnia   . Irritable bowel syndrome   . Mixed hyperlipidemia   . Osteoarthritis   . Palpitations   . Reflux esophagitis   . Tinnitus    Right  . Type 2 diabetes mellitus ([place])       Past Surgical History:  Procedure Laterality Date  . BACK SURGERY    . c-spine surgery     03/2007  . CARDIAC CATHETERIZATION    . CHOLECYSTECTOMY    . COLONOSCOPY  04/21/2011; 05/29/11   6/12: [place] - ?AVM at IC valve, inflammatory changes at [facility] valve [facility]); 7/12 - [facility]; IC valve erosions, look chronic and probably Crohn's per path  . colonoscopy  2017    [place]: random biopsies normal. TI normal  . ESOPHAGOGASTRODUODENOSCOPY  05/29/11   normal  . ESOPHAGOGASTRODUODENOSCOPY N/A 01/21/2016   Dr. Michail Sermon: normal  . EYE SURGERY Bilateral    cataracts removed  . FEMUR IM NAIL Right 04/10/2018   Procedure: INTRAMEDULLARY (IM) NAIL FEMORAL;  Surgeon: Paralee Cancel, MD;  Location: [facility];  Service: Orthopedics;  Laterality: Right;  . HARDWARE REMOVAL Right 04/10/2018   Procedure: HARDWARE REMOVAL RIGHT DISTAL FEMUR;  Surgeon: Paralee Cancel, MD;  Location: [facility];  Service: Orthopedics;  Laterality: Right;  . IR FLUORO GUIDE CV LINE RIGHT  04/01/2017  . IR US GUIDE VASC ACCESS RIGHT  04/01/2017  . JOINT REPLACEMENT Right    hip  . OPEN REDUCTION INTERNAL FIXATION (ORIF) DISTAL RADIAL FRACTURE Right 05/28/2019   Procedure: OPEN REDUCTION INTERNAL FIXATION (ORIF) DISTAL RADIAL FRACTURE;  Surgeon: Verner Mould, MD;  Location: [place];  Service: Orthopedics;  Laterality: Right;  with block 90 minutes  . ORIF FEMUR FRACTURE Right 03/31/2017   Procedure: OPEN REDUCTION INTERNAL FIXATION (ORIF) DISTAL FEMUR FRACTURE;  Surgeon: Paralee Cancel, MD;  Location: [place];  Service: Orthopedics;  Laterality: Right;  . removal of kidney stone    . Right knee arthroscopic surgery    . TONSILLECTOMY    . TOTAL ABDOMINAL HYSTERECTOMY      in for   Chief Complaint  Patient presents with  . Diarrhea     HPI  Heather [name]  is a 70 y.o. female, with past medical history of poorly controlled hypertension, diabetes mellitus, type II, insulin-dependent, CKD, history of CVA, meningioma, patient presents to ED with complaints of diarrhea, reports loose stool for last 3 days, with streaky blood in the stools, but no pools of blood, she does report generalized weakness, generalized muscle ache, with some swelling in the legs, she does report some nausea and vomiting for 2 days, denies any fever, chills, coffee-ground emesis, no chest pain or shortness of  breath. -ED blood pressure was elevated, her hemoglobin is stable pulse was negative, CT abdomen pelvis was obtained CT head with no acute finding as well.    Review of Systems    In addition to the HPI above, No Fever-chills, generalized dizziness, fatigue and generalized weakness for 3 days, but nothing focal. Does report headache, No changes with Vision or hearing, No problems swallowing food or Liquids, No Chest pain, Cough or Shortness of Breath, No Abdominal pain, No Nausea or Vommitting, Bowel movements are regular, Reports diarrhea with blood in stools No dysuria, No new skin rashes or bruises, No new joints pains-aches,  No new weakness, tingling, numbness in any extremity, No recent weight gain or loss, No polyuria, polydypsia or polyphagia, No significant Mental Stressors.  A full 10 point Review of Systems was done, except as stated above, all other Review of Systems were negative.   Social History Social History   Tobacco Use  . Smoking status: Never Smoker  . Smokeless tobacco: Never Used  Substance Use Topics  . Alcohol use: No    Family History Family History  Problem Relation Age of Onset  . Diabetes Mother   . Colon cancer Brother        POSSIBLY. Patient states diagnosed at age 15, then later states age 8. unclear and limited historian  . Esophageal cancer Neg Hx   . Rectal cancer Neg Hx   . Stomach cancer Neg Hx     Prior to Admission medications   Medication Sig Start Date End Date Taking? Authorizing Provider  acetaminophen (TYLENOL) 650 MG CR tablet Take 650 mg by mouth every 8 (eight) hours as needed for pain.   Yes [provider]  allopurinol (ZYLOPRIM) 100 MG tablet Take 100 mg by mouth daily.   Yes [provider]  amLODipine (NORVASC) 5 MG tablet Take 5 mg by mouth daily. 05/23/20  Yes [provider]  Ascorbic Acid (VITAMIN C WITH ROSE HIPS) 500 MG tablet Take 500 mg by mouth every morning.   Yes [provider]  aspirin EC 81 MG tablet Take 81 mg by mouth every morning.    Yes [provider]  atorvastatin (LIPITOR) 80 MG tablet Take 80 mg by mouth every morning.   Yes [provider]  calcium carbonate (OS-CAL) 1250 (500 Ca) MG chewable tablet Chew 2 tablets  by mouth daily. 10/13/20 10/13/21 Yes [provider]  Cholecalciferol (VITAMIN D) 2000 units CAPS Take 2,000 Units by mouth every morning.    Yes [provider]  citalopram (CELEXA) 40 MG tablet Take 40 mg by mouth daily.   Yes [provider]  CREON 36000-114000 units CPEP capsule Take 2 capsules (72,000 Units total) by mouth 3 (three) times daily. 11/04/20  Yes Gatha Mayer, MD  cyanocobalamin (,VITAMIN B-12,) 1000 MCG/ML injection Inject 1,000 mcg into the muscle every 30 (thirty) days.     Yes [provider]  dicyclomine (BENTYL) 20 MG tablet Take 1 tablet (20 mg total) by mouth every 6 (six) hours. 11/04/20  Yes Gatha Mayer, MD  diphenoxylate-atropine (LOMOTIL) 2.5-0.025 MG tablet 1-2 tablets qid prn diarrhea Instead of Imodium 10/19/20  Yes Gatha Mayer, MD  esomeprazole (NEXIUM) 40 MG capsule Take 40 mg by mouth daily before breakfast.     Yes [provider]  fenofibrate (TRICOR) 145 MG tablet Take 1 tablet (145 mg total) by mouth daily. 09/07/20  Yes Branch, Alphonse Guild, MD  furosemide (LASIX) 40 MG tablet Take 1 tablet (40 mg total) by mouth daily. Patient taking differently: Take 40 mg by mouth 2 (two) times daily. 01/26/16  Yes Reyne Dumas, MD  gabapentin (NEURONTIN) 300 MG capsule Take 300 mg by mouth 3 (three) times daily. 10/20/20  Yes [provider]  hydrALAZINE (APRESOLINE) 100 MG tablet Take 1 tablet (100 mg total) by mouth every 8 (eight) hours. Patient taking differently: Take 100 mg by mouth 3 (three) times daily. 05/19/20  Yes Barton Dubois, MD  insulin detemir (LEVEMIR FLEXTOUCH) 100 UNIT/ML FlexPen Inject 20 Units into the skin at  bedtime. Patient adjust for her sugars 05/19/20  Yes Barton Dubois, MD  Insulin Lispro (HUMALOG KWIKPEN [place]) Inject 20 Units into the skin 3 (three) times daily with meals. Sliding scale   Yes [provider]  methocarbamol (ROBAXIN) 500 MG tablet Take 1 tablet (500 mg total) by mouth every 6 (six) hours as needed for muscle spasms. 05/19/20  Yes Barton Dubois, MD  metoprolol tartrate (LOPRESSOR) 100 MG tablet Take 100 mg by mouth 2 (two) times daily. 09/23/20  Yes [provider]  nystatin (MYCOSTATIN) 100000 UNIT/ML suspension Take 20 mLs by mouth as needed. 10/05/20  Yes [provider]  omega-3 acid ethyl esters (LOVAZA) 1 g capsule Take 2 capsules by mouth 2 (two) times daily. 10/11/20  Yes [provider]  ondansetron (ZOFRAN ODT) 4 MG disintegrating tablet Take 1 tablet (4 mg total) by mouth every 8 (eight) hours as needed. 11/04/20  Yes Fredia Sorrow, MD  ondansetron (ZOFRAN) 4 MG tablet Take 4 mg by mouth every 8 (eight) hours as needed for nausea. 08/24/20  Yes [provider]  potassium chloride SA (K-DUR,KLOR-CON) 20 MEQ tablet Take 20 mEq by mouth every morning.    Yes [provider]  promethazine (PHENERGAN) 25 MG tablet Take 25 mg by mouth as needed. 07/27/20  Yes [provider]  Semaglutide,0.25 or 0.5MG /DOS, (OZEMPIC, 0.25 OR 0.5 MG/DOSE,) 2 MG/1.5ML SOPN Inject 2 mg into the skin every Sunday.    Yes [provider]  tiZANidine (ZANAFLEX) 4 MG tablet Take 8 mg by mouth at bedtime. 09/06/20  Yes [provider]  venlafaxine XR (EFFEXOR-XR) 75 MG 24 hr capsule Take 75 mg by mouth daily. 10/26/20  Yes [provider]  zolpidem (AMBIEN) 10 MG tablet Take 10 mg by mouth at bedtime.  05/15/20  Yes [provider]  butalbital-acetaminophen-caffeine (FIORICET) 50-325-40 MG tablet Take 1 tablet by mouth every 6 (six) hours as needed for migraine. Patient not taking: No sig reported 05/19/20 05/19/21   Barton Dubois, MD  hydrocortisone 2.5 % cream Apply topically 2 (two) times daily. Patient not taking: Reported on 11/04/2020 10/19/20   Gatha Mayer, MD  labetalol (NORMODYNE) 300 MG tablet Take 1 tablet (300 mg total) by mouth 2 (two) times daily. Patient not taking: No sig reported 08/22/20   Arnoldo Lenis, MD    Anti-infectives (From admission, onward)   None      Scheduled Meds: Continuous Infusions: . sodium chloride 100 mL/hr at 11/04/20 1721   PRN Meds:.  Allergies  Allergen Reactions  . Duloxetine Hcl Swelling  . Fluocinolone Swelling  . Acetaminophen Itching and Swelling    Pt states "some swelling"  . Amlodipine Besylate Swelling    Swelling of feet, fluid retention  . Ciprofloxacin Nausea And Vomiting and Swelling    Swelling of face, jaw, and lips  . Hydrocodone-Acetaminophen Itching  . Metformin Other (See Comments)    REACTION: GI UPSET  . Nsaids Other (See Comments)    "HAS BLEEDING ULCERS"  . Quinolones Swelling  . Sulfamethoxazole Swelling    Swelling of feet, legs  . Valsartan Swelling  . Cozaar [Losartan] Swelling and Rash  . Lisinopril Swelling and Rash    Oral swelling, and red streaks on arms/stomach  . Tape Itching and Rash    Paper tape can only be used on this patient    Physical Exam  Vitals  Blood pressure (!) 186/83, pulse 88, temperature 98 F (36.7 C), temperature source Oral, resp. rate 16, height 5\' 3"  (1.6 m), weight 68.9 kg, SpO2 100 %.   1. General developed female, laying in bed, in no apparent distress  2. Normal affect and insight, Not Suicidal or Homicidal, Awake Alert, Oriented X 3.  3. No F.N deficits, ALL C.Nerves Intact, Strength 5/5 all 4 extremities, Sensation intact all 4 extremities, Plantars down going.  4. Ears and Eyes appear Normal, Conjunctivae clear, PERRLA. Moist Oral Mucosa.  5. Supple Neck, No JVD, No cervical lymphadenopathy appriciated, No Carotid Bruits.  6. Symmetrical Chest wall  movement, Good air movement bilaterally, CTAB.  7. RRR, No Gallops, Rubs or Murmurs, No Parasternal Heave.  8. Positive Bowel Sounds, Abdomen Soft, No tenderness, No organomegaly appriciated,No rebound -guarding or rigidity.  9.  No Cyanosis, Normal Skin Turgor, No Skin Rash or Bruise.  10. Good muscle tone,  joints appear normal , no effusions, Normal ROM.    Data Review  CBC Recent Labs  Lab 11/04/20 1636  WBC 8.2  HGB 9.0*  HCT 28.3*  PLT 228  MCV 100.0  MCH 31.8  MCHC 31.8  RDW 14.5   ------------------------------------------------------------------------------------------------------------------  Chemistries  Recent Labs  Lab 11/04/20 1636  NA 131*  K 4.2  CL 98  CO2 22  GLUCOSE 305*  BUN 44*  CREATININE 2.42*  CALCIUM 7.7*  AST 17  ALT 16  ALKPHOS 82  BILITOT 0.4   ------------------------------------------------------------------------------------------------------------------ estimated creatinine clearance is 20.7 mL/min (A) (by C-G formula based on SCr of 2.42 mg/dL (H)). ------------------------------------------------------------------------------------------------------------------ No results for input(s): TSH, T4TOTAL, T3FREE, THYROIDAB in the last 72 hours.  Invalid input(s): FREET3   Coagulation profile No results for input(s): INR, PROTIME in the last 168 hours. ------------------------------------------------------------------------------------------------------------------- No results for input(s): DDIMER in the last 72 hours. -------------------------------------------------------------------------------------------------------------------  Cardiac Enzymes No  results for input(s): CKMB, TROPONINI, MYOGLOBIN in the last 168 hours.  Invalid input(s): CK ------------------------------------------------------------------------------------------------------------------ Invalid input(s):  POCBNP   ---------------------------------------------------------------------------------------------------------------  Urinalysis    Component Value Date/Time   COLORURINE YELLOW 03/02/2020 [place] 03/02/2020 1420   LABSPEC 1.012 03/02/2020 1420   PHURINE 5.0 03/02/2020 1420   GLUCOSEU NEGATIVE 03/02/2020 1420   GLUCOSEU NEGATIVE 03/28/2018 1224   HGBUR NEGATIVE 03/02/2020 1420   BILIRUBINUR NEGATIVE 03/02/2020 1420   KETONESUR NEGATIVE 03/02/2020 1420   PROTEINUR 100 (A) 03/02/2020 1420   UROBILINOGEN 1.0 03/28/2018 1224   NITRITE NEGATIVE 03/02/2020 1420   LEUKOCYTESUR TRACE (A) 03/02/2020 1420     Imaging results:   CT Abdomen Pelvis Wo Contrast  Result Date: 11/04/2020 CLINICAL DATA:  Abdominal pain and blood in stool for 3 days. Nausea and vomiting. EXAM: CT ABDOMEN AND PELVIS WITHOUT CONTRAST TECHNIQUE: Multidetector CT imaging of the abdomen and pelvis was performed following the standard protocol without IV contrast. COMPARISON:  03/02/2020 FINDINGS: Lower chest: No acute findings. Hepatobiliary: No mass visualized on this unenhanced exam. Prior cholecystectomy. No evidence of biliary obstruction. Pancreas: No mass or inflammatory process visualized on this unenhanced exam. Spleen:  Within normal limits in size. Adrenals/Urinary tract: No evidence of urolithiasis or hydronephrosis. Unremarkable unopacified urinary bladder. Stomach/Bowel: No evidence of obstruction, inflammatory process, or abnormal fluid collections. Vascular/Lymphatic: No pathologically enlarged lymph nodes identified. No evidence of abdominal aortic aneurysm. Aortic atherosclerotic calcification noted. Reproductive: Prior hysterectomy noted. Adnexal regions are unremarkable in appearance. Other:  None. Musculoskeletal: No suspicious bone lesions identified. Lumbar spine fusion hardware and right hip internal fixation hardware again noted. IMPRESSION: No evidence of urolithiasis,  hydronephrosis, or other acute findings. Aortic Atherosclerosis (ICD10-I70.0). Electronically Signed   By: Marlaine Hind M.D.   On: 11/04/2020 18:57   CT Head Wo Contrast  Result Date: 11/04/2020 CLINICAL DATA:  Neuro deficit. Acute stroke suspected. Dizziness for 3 days with weakness. EXAM: CT HEAD WITHOUT CONTRAST TECHNIQUE: Contiguous axial images were obtained from the base of the skull through the vertex without intravenous contrast. COMPARISON:  May 16, 2020 FINDINGS: Brain: No subdural, epidural, or subarachnoid hemorrhage. Cerebellum, brainstem, and basal cisterns are normal. Mildly prominent sulci, particularly frontally. This finding is stable. Ventricles are stable and unremarkable. No mass effect or midline shift. No acute cortical ischemia or infarct. Vascular: Calcified atherosclerosis in the intracranial carotids. Skull: Normal. Negative for fracture or focal lesion. Sinuses/Orbits: No acute finding. Other: None. IMPRESSION: 1. No acute intracranial abnormalities to explain the patient's symptoms. Electronically Signed   By: Dorise Bullion III M.D   On: 11/04/2020 18:57      Assessment & Plan  Active Problems:   Essential hypertension   Acute kidney injury superimposed on CKD ([place])   Type 2 diabetes mellitus without complications ([place])    Hypertension, uncontrolled -Patient reported her blood pressure is chronically elevated, has been managed by her nephrologist Dr. Theador Hawthorne as an outpatient, she is currently on metoprolol, Diovan, hydralazine and amlodipine 5 mg, was instructed to increase her amlodipine to 10 mg oral daily, she has a follow-up with her nephrologist on Friday, and if needed Imdur can be started.  AKI in CKD -With worsening creatinine at 2.4, discussed with patient optimal blood pressure and blood glucose control would be mainstay of therapy here. -Was instructed to hold her Lasix for next 3 days.  Diabetes mellitus, type II, poorly controlled with  hyperglycemia -Patient reports she recently increased her Lantus to 24 units with  poor control, she was instructed to increase Lantus to 28 units nightly, and to follow with PCP regarding further adjustment as needed.  Diarrhea, with questionable blood in stool -She does report this has significantly improved over last few days, she is Hemoccult negative, H&H and stable, no acute finding on CT abdomen and pelvis, he was instructed to take Imodium as needed, which reports she is already doing.   Family Communication: Plan discussed with patient and husband at bedside   Thank you for the consult, we will follow the patient with you in the Hospital.   Phillips Climes M.D on 11/04/2020 at 8:33 PM  Between 7am to 7pm -  After 7pm go to [email or web address]    Thank you for the consult, we will follow the patient with you in the [facility]   Office  [phone]

## 2020-11-04 NOTE — ED Triage Notes (Signed)
Pt presents to ED with loose stools x 3 days, blood noted in the stools, weakness, muscle aches, swelling in legs, nausea and vomiting x 2 days, . Dr. In R[place] Allegra  DX with a brain tumor about 3 months ago. Pt intake is well.

## 2020-11-04 NOTE — Telephone Encounter (Signed)
Pt Is requesting a call back from a nurse to discuss the constant diarrhea she has been experiencing for the past 3 days, pt is also noticing blood in stools.

## 2020-11-04 NOTE — ED Provider Notes (Signed)
[facility] EMERGENCY DEPARTMENT Provider Note   CSN: [id number] Arrival date & time: 11/04/20  1535     History Chief Complaint  Patient presents with  . Diarrhea    GRIZELDA PISCOPO is a 70 y.o. female.  Patient just recently seen by neurology in [place].  Patient has a history of meningioma.  Does not appear to be cancerous.  Patient's had ovarian cancer in the past.  Patient presenting with loose stools for 3 days blowup streaky blood noted in the stools but not pools of blood.  Generalized weakness muscle aches some swelling in the legs and nausea and vomiting for 2 days.  Says she has some abdominal discomfort and also has some abdominal distention.  Patient's past medical history is known for chronic kidney disease stage IV.  Patient is also known to have irritable bowel syndrome.        Past Medical History:  Diagnosis Date  . Acute renal failure superimposed on stage 3 chronic kidney disease ([place]) 01/21/2016  . Anemia   . Anxiety   . B12 deficiency   . Brain tumor (benign) ([place]) 10/29/2020  . Cellulitis and abscess    Abdomen and buttocks  . CKD (chronic kidney disease) stage 3, GFR 30-59 ml/min (HCC)   . Closed fracture of distal end of femur, unspecified fracture morphology, initial encounter ([place])   . Depression   . Diabetic neuropathy (HCC)    feet  . DJD (degenerative joint disease)    Right forminal stenosis C4-5  . Essential hypertension   . Femur fracture, right ([place]) 03/30/2017  . Folliculitis   . GERD (gastroesophageal reflux disease)   . Gout   . Headache(784.0)   . Hiatal hernia   . History of blood transfusion   . History of bronchitis   . History of cardiac catheterization    No significant CAD 2012  . History of kidney stones    surgery to remove  . Insomnia   . Irritable bowel syndrome   . Mixed hyperlipidemia   . Osteoarthritis   . Palpitations   . Reflux esophagitis   . Tinnitus    Right  . Type 2 diabetes mellitus [facility])      Patient Active Problem List   Diagnosis Date Noted  . Headache 05/17/2020  . Hypokalemia 05/17/2020  . Hypomagnesemia 05/17/2020  . Dehydration 05/17/2020  . Hypertensive urgency 05/17/2020  . Diplopia 05/17/2020  . Left hip pain 09/22/2019  . Lumbar radiculopathy 09/22/2019  . Postoperative anemia due to acute blood loss 09/18/2019  . Postoperative urinary retention 09/18/2019  . Closed fracture of distal end of radius 05/26/2019  . Closed right hip fracture ([place]) 04/08/2018  . Salmonella   . Elevated lipase   . Gastroenteritis   . Protein-calorie malnutrition, severe 03/05/2018  . Neck pain 03/12/2017  . Acute kidney injury superimposed on CKD ([place]) 01/25/2016  . Hyperkalemia 01/06/2016  . Neuropathy 03/02/2015  . Pain in the chest   . Chest pain 12/22/2014  . Essential hypertension 12/22/2014  . Type 2 diabetes with nephropathy ([place]) 12/22/2014  . Hiatal hernia 12/22/2014  . GERD (gastroesophageal reflux disease) 12/22/2014  . Gout 12/22/2014  . Mixed hyperlipidemia 12/22/2014  . Depression 12/22/2014  . Type 2 diabetes mellitus without complications ([place]) 04/23/9484  . Fusion of spine, cervicothoracic region 11/01/2014  . Pseudophakia, left eye 10/20/2014  . Chronic kidney disease, stage III (moderate) ([place]) 08/25/2014  . Cervical stenosis of spine 08/26/2013  . Epigastric pain 05/13/2012  .  Vitamin B12 deficiency 07/08/2011  . Personal history of failed moderate sedation 04/16/2011  . Chronic Nausea and Vomiting - ? gastroapresis 04/16/2011  . Early satiety 04/16/2011  . Irritable bowel syndrome 04/16/2011  . Benign paroxysmal positional vertigo 11/20/2010  . TRICUSPID REGURGITATION, MODERATE 11/20/2010  . PULMONARY HYPERTENSION, MILD 11/20/2010  . LEG EDEMA, BILATERAL 11/20/2010  . DYSPNEA 11/20/2010  . NONSPECIFIC ABNORMAL ELECTROCARDIOGRAM 11/20/2010  . CHEST WALL PAIN, HX OF 11/20/2010    Past Surgical History:  Procedure Laterality Date  . BACK  SURGERY    . c-spine surgery     03/2007  . CARDIAC CATHETERIZATION    . CHOLECYSTECTOMY    . COLONOSCOPY  04/21/2011; 05/29/11   6/12: [place] - ?AVM at IC valve, inflammatory changes at [facility] valve [facility]); 7/12 - [place]; IC valve erosions, look chronic and probably Crohn's per path  . colonoscopy  2017   [place]: random biopsies normal. TI normal  . ESOPHAGOGASTRODUODENOSCOPY  05/29/11   normal  . ESOPHAGOGASTRODUODENOSCOPY N/A 01/21/2016   Dr. Michail Sermon: normal  . EYE SURGERY Bilateral    cataracts removed  . FEMUR IM NAIL Right 04/10/2018   Procedure: INTRAMEDULLARY (IM) NAIL FEMORAL;  Surgeon: Paralee Cancel, MD;  Location: [facility];  Service: Orthopedics;  Laterality: Right;  . HARDWARE REMOVAL Right 04/10/2018   Procedure: HARDWARE REMOVAL RIGHT DISTAL FEMUR;  Surgeon: Paralee Cancel, MD;  Location: [facility];  Service: Orthopedics;  Laterality: Right;  . IR FLUORO GUIDE CV LINE RIGHT  04/01/2017  . IR US GUIDE VASC ACCESS RIGHT  04/01/2017  . JOINT REPLACEMENT Right    hip  . OPEN REDUCTION INTERNAL FIXATION (ORIF) DISTAL RADIAL FRACTURE Right 05/28/2019   Procedure: OPEN REDUCTION INTERNAL FIXATION (ORIF) DISTAL RADIAL FRACTURE;  Surgeon: Verner Mould, MD;  Location: [facility];  Service: Orthopedics;  Laterality: Right;  with block 90 minutes  . ORIF FEMUR FRACTURE Right 03/31/2017   Procedure: OPEN REDUCTION INTERNAL FIXATION (ORIF) DISTAL FEMUR FRACTURE;  Surgeon: Paralee Cancel, MD;  Location: [facility];  Service: Orthopedics;  Laterality: Right;  . removal of kidney stone    . Right knee arthroscopic surgery    . TONSILLECTOMY    . TOTAL ABDOMINAL HYSTERECTOMY       OB History   No obstetric history on file.     Family History  Problem Relation Age of Onset  . Diabetes Mother   . Colon cancer Brother        POSSIBLY. Patient states diagnosed at age 65, then later states age 74. unclear and limited historian  . Esophageal cancer Neg Hx   . Rectal cancer Neg Hx   .  Stomach cancer Neg Hx     Social History   Tobacco Use  . Smoking status: Never Smoker  . Smokeless tobacco: Never Used  Vaping Use  . Vaping Use: Never used  Substance Use Topics  . Alcohol use: No  . Drug use: No    Home Medications Prior to Admission medications   Medication Sig Start Date End Date Taking? Authorizing Provider  acetaminophen (TYLENOL) 650 MG CR tablet Take 650 mg by mouth every 8 (eight) hours as needed for pain.   Yes [provider]  allopurinol (ZYLOPRIM) 100 MG tablet Take 100 mg by mouth daily.   Yes [provider]  amLODipine (NORVASC) 5 MG tablet Take 5 mg by mouth daily. 05/23/20  Yes [provider]  Ascorbic Acid (VITAMIN C WITH ROSE HIPS) 500 MG tablet  Take 500 mg by mouth every morning.   Yes [provider]  aspirin EC 81 MG tablet Take 81 mg by mouth every morning.    Yes [provider]  atorvastatin (LIPITOR) 80 MG tablet Take 80 mg by mouth every morning.   Yes [provider]  calcium carbonate (OS-CAL) 1250 (500 Ca) MG chewable tablet Chew 2 tablets by mouth daily. 10/13/20 10/13/21 Yes [provider]  Cholecalciferol (VITAMIN D) 2000 units CAPS Take 2,000 Units by mouth every morning.    Yes [provider]  citalopram (CELEXA) 40 MG tablet Take 40 mg by mouth daily.   Yes [provider]  CREON 36000-114000 units CPEP capsule Take 2 capsules (72,000 Units total) by mouth 3 (three) times daily. 11/04/20  Yes Gatha Mayer, MD  cyanocobalamin (,VITAMIN B-12,) 1000 MCG/ML injection Inject 1,000 mcg into the muscle every 30 (thirty) days.     Yes [provider]  dicyclomine (BENTYL) 20 MG tablet Take 1 tablet (20 mg total) by mouth every 6 (six) hours. 11/04/20  Yes Gatha Mayer, MD  diphenoxylate-atropine (LOMOTIL) 2.5-0.025 MG tablet 1-2 tablets qid prn diarrhea Instead of Imodium 10/19/20  Yes Gatha Mayer, MD  esomeprazole (NEXIUM) 40 MG capsule Take  40 mg by mouth daily before breakfast.     Yes [provider]  fenofibrate (TRICOR) 145 MG tablet Take 1 tablet (145 mg total) by mouth daily. 09/07/20  Yes Branch, Alphonse Guild, MD  furosemide (LASIX) 40 MG tablet Take 1 tablet (40 mg total) by mouth daily. Patient taking differently: Take 40 mg by mouth 2 (two) times daily. 01/26/16  Yes Reyne Dumas, MD  gabapentin (NEURONTIN) 300 MG capsule Take 300 mg by mouth 3 (three) times daily. 10/20/20  Yes [provider]  hydrALAZINE (APRESOLINE) 100 MG tablet Take 1 tablet (100 mg total) by mouth every 8 (eight) hours. Patient taking differently: Take 100 mg by mouth 3 (three) times daily. 05/19/20  Yes Barton Dubois, MD  insulin detemir (LEVEMIR FLEXTOUCH) 100 UNIT/ML FlexPen Inject 20 Units into the skin at bedtime. Patient adjust for her sugars 05/19/20  Yes Barton Dubois, MD  Insulin Lispro (HUMALOG KWIKPEN [place]) Inject 20 Units into the skin 3 (three) times daily with meals. Sliding scale   Yes [provider]  methocarbamol (ROBAXIN) 500 MG tablet Take 1 tablet (500 mg total) by mouth every 6 (six) hours as needed for muscle spasms. 05/19/20  Yes Barton Dubois, MD  metoprolol tartrate (LOPRESSOR) 100 MG tablet Take 100 mg by mouth 2 (two) times daily. 09/23/20  Yes [provider]  nystatin (MYCOSTATIN) 100000 UNIT/ML suspension Take 20 mLs by mouth as needed. 10/05/20  Yes [provider]  omega-3 acid ethyl esters (LOVAZA) 1 g capsule Take 2 capsules by mouth 2 (two) times daily. 10/11/20  Yes [provider]  ondansetron (ZOFRAN) 4 MG tablet Take 4 mg by mouth every 8 (eight) hours as needed for nausea. 08/24/20  Yes [provider]  potassium chloride SA (K-DUR,KLOR-CON) 20 MEQ tablet Take 20 mEq by mouth every morning.    Yes [provider]  promethazine (PHENERGAN) 25 MG tablet Take 25 mg by mouth as needed. 07/27/20  Yes [provider]  Semaglutide,0.25 or  0.5MG /DOS, (OZEMPIC, 0.25 OR 0.5 MG/DOSE,) 2 MG/1.5ML SOPN Inject 2 mg into the skin every Sunday.    Yes [provider]  tiZANidine (ZANAFLEX) 4 MG tablet Take 8 mg by mouth at bedtime. 09/06/20  Yes [provider]  venlafaxine XR (EFFEXOR-XR) 75 MG 24 hr capsule Take 75 mg by mouth daily. 10/26/20  Yes [provider]  zolpidem (AMBIEN) 10 MG tablet Take 10 mg by mouth at bedtime. 05/15/20  Yes [provider]  butalbital-acetaminophen-caffeine (FIORICET) 50-325-40 MG tablet Take 1 tablet by mouth every 6 (six) hours as needed for migraine. Patient not taking: No sig reported 05/19/20 05/19/21  Barton Dubois, MD  hydrocortisone 2.5 % cream Apply topically 2 (two) times daily. Patient not taking: Reported on 11/04/2020 10/19/20   Gatha Mayer, MD  labetalol (NORMODYNE) 300 MG tablet Take 1 tablet (300 mg total) by mouth 2 (two) times daily. Patient not taking: No sig reported 08/22/20   Arnoldo Lenis, MD    Allergies    Duloxetine hcl, Fluocinolone, Acetaminophen, Amlodipine besylate, Ciprofloxacin, Hydrocodone-acetaminophen, Metformin, Nsaids, Quinolones, Sulfamethoxazole, Valsartan, Cozaar [losartan], Lisinopril, and Tape  Review of Systems   Review of Systems  Constitutional: Negative for chills and fever.  HENT: Negative for congestion, rhinorrhea and sore throat.   Eyes: Negative for visual disturbance.  Respiratory: Negative for cough and shortness of breath.   Cardiovascular: Negative for chest pain and leg swelling.  Gastrointestinal: Positive for abdominal distention, abdominal pain, blood in stool, diarrhea, nausea and vomiting.  Genitourinary: Negative for dysuria.  Musculoskeletal: Negative for back pain and neck pain.  Skin: Negative for rash.  Neurological: Positive for dizziness. Negative for light-headedness and headaches.  Hematological: Does not bruise/bleed easily.  Psychiatric/Behavioral: Negative for confusion.    Physical  Exam Updated Vital Signs BP (!) 186/83   Pulse 88   Temp 98 F (36.7 C) (Oral)   Resp 16   Ht 1.6 m (5\' 3" )   Wt 68.9 kg   LMP  (LMP Unknown)   SpO2 100%   BMI 26.91 kg/m   Physical Exam Vitals and nursing note reviewed.  Constitutional:      General: She is not in acute distress.    Appearance: Normal appearance. She is well-developed and well-nourished.  HENT:     Head: Normocephalic and atraumatic.  Eyes:     Extraocular Movements: Extraocular movements intact.     Conjunctiva/sclera: Conjunctivae normal.     Pupils: Pupils are equal, round, and reactive to light.  Cardiovascular:     Rate and Rhythm: Normal rate and regular rhythm.     Heart sounds: No murmur heard.   Pulmonary:     Effort: Pulmonary effort is normal. No respiratory distress.     Breath sounds: Normal breath sounds.  Abdominal:     General: There is distension.     Palpations: Abdomen is soft.     Tenderness: There is no abdominal tenderness.  Genitourinary:    Rectum: Guaiac result negative.  Musculoskeletal:        General: No edema. Normal range of motion.     Cervical back: Neck supple.  Skin:    General: Skin is warm and dry.     Capillary Refill: Capillary refill takes less than 2 seconds.  Neurological:     General: No focal deficit present.     Mental Status: She is alert and oriented to person, place, and time.     Cranial Nerves: No cranial nerve deficit.     Sensory: No sensory deficit.     Motor: No weakness.  Psychiatric:        Mood and Affect: Mood and affect normal.     ED Results / Procedures /  Treatments   Labs (all labs ordered are listed, but only abnormal results are displayed) Labs Reviewed  COMPREHENSIVE METABOLIC PANEL - Abnormal; Notable for the following components:      Result Value   Sodium 131 (*)    Glucose, Bld 305 (*)    BUN 44 (*)    Creatinine, Ser 2.42 (*)    Calcium 7.7 (*)    GFR, Estimated 21 (*)    All other components within normal limits   CBC - Abnormal; Notable for the following components:   RBC 2.83 (*)    Hemoglobin 9.0 (*)    HCT 28.3 (*)    All other components within normal limits  RESP PANEL BY RT-PCR (FLU A&B, COVID) ARPGX2  LIPASE, BLOOD  POC OCCULT BLOOD, ED  TYPE AND SCREEN    EKG None  Radiology CT Abdomen Pelvis Wo Contrast  Result Date: 11/04/2020 CLINICAL DATA:  Abdominal pain and blood in stool for 3 days. Nausea and vomiting. EXAM: CT ABDOMEN AND PELVIS WITHOUT CONTRAST TECHNIQUE: Multidetector CT imaging of the abdomen and pelvis was performed following the standard protocol without IV contrast. COMPARISON:  03/02/2020 FINDINGS: Lower chest: No acute findings. Hepatobiliary: No mass visualized on this unenhanced exam. Prior cholecystectomy. No evidence of biliary obstruction. Pancreas: No mass or inflammatory process visualized on this unenhanced exam. Spleen:  Within normal limits in size. Adrenals/Urinary tract: No evidence of urolithiasis or hydronephrosis. Unremarkable unopacified urinary bladder. Stomach/Bowel: No evidence of obstruction, inflammatory process, or abnormal fluid collections. Vascular/Lymphatic: No pathologically enlarged lymph nodes identified. No evidence of abdominal aortic aneurysm. Aortic atherosclerotic calcification noted. Reproductive: Prior hysterectomy noted. Adnexal regions are unremarkable in appearance. Other:  None. Musculoskeletal: No suspicious bone lesions identified. Lumbar spine fusion hardware and right hip internal fixation hardware again noted. IMPRESSION: No evidence of urolithiasis, hydronephrosis, or other acute findings. Aortic Atherosclerosis (ICD10-I70.0). Electronically Signed   By: Marlaine Hind M.D.   On: 11/04/2020 18:57   CT Head Wo Contrast  Result Date: 11/04/2020 CLINICAL DATA:  Neuro deficit. Acute stroke suspected. Dizziness for 3 days with weakness. EXAM: CT HEAD WITHOUT CONTRAST TECHNIQUE: Contiguous axial images were obtained from the base of the skull  through the vertex without intravenous contrast. COMPARISON:  May 16, 2020 FINDINGS: Brain: No subdural, epidural, or subarachnoid hemorrhage. Cerebellum, brainstem, and basal cisterns are normal. Mildly prominent sulci, particularly frontally. This finding is stable. Ventricles are stable and unremarkable. No mass effect or midline shift. No acute cortical ischemia or infarct. Vascular: Calcified atherosclerosis in the intracranial carotids. Skull: Normal. Negative for fracture or focal lesion. Sinuses/Orbits: No acute finding. Other: None. IMPRESSION: 1. No acute intracranial abnormalities to explain the patient's symptoms. Electronically Signed   By: Dorise Bullion III M.D   On: 11/04/2020 18:57    Procedures Procedures (including critical care time)  Medications Ordered in ED Medications  0.9 %  sodium chloride infusion ( Intravenous New Bag/Given 11/04/20 1721)  ondansetron (ZOFRAN) injection 4 mg (4 mg Intravenous Given 11/04/20 1633)  ondansetron (ZOFRAN) injection 4 mg (4 mg Intravenous Given 11/04/20 1722)  HYDROmorphone (DILAUDID) injection 0.5 mg (0.5 mg Intravenous Given 11/04/20 1722)  ondansetron (ZOFRAN) injection 4 mg (4 mg Intravenous Given 11/04/20 1950)  HYDROmorphone (DILAUDID) injection 0.5 mg (0.5 mg Intravenous Given 11/04/20 1950)    ED Course  I have reviewed the triage vital signs and the nursing notes.  Pertinent labs & imaging results that were available during my care of the patient were reviewed by me  and considered in my medical decision making (see chart for details).    MDM Rules/Calculators/A&P                          Patient brought in by family members with concerns that she needs admission.  Patient with symptoms of diarrhea says there was streaks of blood but she is Hemoccult negative here today.  She has had some dizziness muscle aches and weakness some abdominal bloating nausea and vomiting.  Patient seen by neurology.  Has a meningioma in the brain.  Had  ovarian cancer in the past.  Covid testing negative.  Head CT here without any acute findings CT abdomen without any acute findings.  Patient's labs significant for some elevated blood sugar worsening BUN and creatinine from her baseline.  But she does have chronic kidney disease stage IV.  Hemoglobin is 9.  It was 8.4 couple weeks ago.  So it is improved.  Patient did not receive a blood transfusion.  No evidence of any significant GI bleed here today.  We will discuss with hospitalist whether the hypertension the worsening BUN and creatinine and the dizziness may warrant admission.    Discussed with hospitalist.  They recommend holding the Lasix for 3 days.  And increasing her Norvasc to 10 mg from 5.  And follow-up with her primary care doctor about the hypertension.  Final Clinical Impression(s) / ED Diagnoses Final diagnoses:  Diarrhea, unspecified type  Primary hypertension  Dizziness  CKD (chronic kidney disease), stage IV [facility])    Rx / DC Orders ED Discharge Orders    None       Fredia Sorrow, MD 11/04/20 2019

## 2020-11-04 NOTE — Telephone Encounter (Signed)
Patient reports that she is having greater than 10 episodes of watery diarrhea for the last few days.  She also reports some blood in her stool that is bright red.  She states that she has been taking 6 lomotil a day and her dicyclomine qid.  She is advised to increase her lomotil to 8 a day as needed per rx instructions, continue the dicyclomine 20 qid.  She is advised to push fluids and replace each stool with 8 oz of fluids.  She states she "feels dehydrated.  She is advised to report to ED if she feels weak, dizzy, tachycardic, etc she should report to ED.  No new medications , sick contacts, etc.  She is advised will have Dr. Carlean Purl review when he returns on Monday.  See path results from 03/18/20.    Refills sent for refills sent for creon and dicyclomine

## 2020-11-04 NOTE — Discharge Instructions (Addendum)
Internal medicine doctors recommend holding your water pill for 3 days.  And to increase your Norvasc from 5 mg a day to 10 mg a day.  Make an appointment to follow-up with your primary care doctor regarding the elevated blood sugars as well as the high blood pressure.  Continue follow-up with your neurologist as scheduled.  Take the Zofran as directed for nausea and vomiting.

## 2020-11-07 LAB — POC OCCULT BLOOD, ED: Fecal Occult Bld: NEGATIVE

## 2020-11-07 NOTE — Telephone Encounter (Signed)
Please schedule a capsule endoscopy of small bowel  Dx:  blood in stool Diarrhea  Otherwise follow current advice

## 2020-11-08 NOTE — Telephone Encounter (Signed)
Patient notified of the recommendations She is scheduled for 11/17/20 Instructions mailed to the patient  She will call if she has questions once she receives them.

## 2020-11-08 NOTE — Telephone Encounter (Signed)
Left message for patient to call back  

## 2020-11-10 DIAGNOSIS — D692 Other nonthrombocytopenic purpura: Secondary | ICD-10-CM | POA: Diagnosis not present

## 2020-11-10 DIAGNOSIS — Z789 Other specified health status: Secondary | ICD-10-CM | POA: Diagnosis not present

## 2020-11-10 DIAGNOSIS — Z299 Encounter for prophylactic measures, unspecified: Secondary | ICD-10-CM | POA: Diagnosis not present

## 2020-11-10 DIAGNOSIS — I10 Essential (primary) hypertension: Secondary | ICD-10-CM | POA: Diagnosis not present

## 2020-11-10 DIAGNOSIS — E1165 Type 2 diabetes mellitus with hyperglycemia: Secondary | ICD-10-CM | POA: Diagnosis not present

## 2020-11-11 ENCOUNTER — Other Ambulatory Visit: Payer: Self-pay | Admitting: Nephrology

## 2020-11-11 ENCOUNTER — Other Ambulatory Visit (HOSPITAL_COMMUNITY): Payer: Self-pay | Admitting: Nephrology

## 2020-11-11 DIAGNOSIS — N189 Chronic kidney disease, unspecified: Secondary | ICD-10-CM | POA: Diagnosis not present

## 2020-11-11 DIAGNOSIS — R809 Proteinuria, unspecified: Secondary | ICD-10-CM | POA: Diagnosis not present

## 2020-11-11 DIAGNOSIS — I129 Hypertensive chronic kidney disease with stage 1 through stage 4 chronic kidney disease, or unspecified chronic kidney disease: Secondary | ICD-10-CM | POA: Diagnosis not present

## 2020-11-11 DIAGNOSIS — N170 Acute kidney failure with tubular necrosis: Secondary | ICD-10-CM | POA: Diagnosis not present

## 2020-11-11 DIAGNOSIS — I5032 Chronic diastolic (congestive) heart failure: Secondary | ICD-10-CM | POA: Diagnosis not present

## 2020-11-11 DIAGNOSIS — E1122 Type 2 diabetes mellitus with diabetic chronic kidney disease: Secondary | ICD-10-CM

## 2020-11-11 DIAGNOSIS — E1129 Type 2 diabetes mellitus with other diabetic kidney complication: Secondary | ICD-10-CM

## 2020-11-11 DIAGNOSIS — D631 Anemia in chronic kidney disease: Secondary | ICD-10-CM | POA: Diagnosis not present

## 2020-11-14 NOTE — Discharge Instructions (Signed)
Iron-Rich Diet  Iron is a mineral that helps your body to produce hemoglobin. Hemoglobin is a protein in red blood cells that carries oxygen to your body's tissues. Eating too little iron may cause you to feel weak and tired, and it can increase your risk of infection. Iron is naturally found in many foods, and many foods have iron added to them (iron-fortified foods). You may need to follow an iron-rich diet if you do not have enough iron in your body due to certain medical conditions. The amount of iron that you need each day depends on your age, your sex, and any medical conditions you have. Follow instructions from your health care provider or a diet and nutrition specialist (dietitian) about how much iron you should eat each day. What are tips for following this plan? Reading food labels  Check food labels to see how many milligrams (mg) of iron are in each serving. Cooking  Cook foods in pots and pans that are made from iron.  Take these steps to make it easier for your body to absorb iron from certain foods: ? Soak beans overnight before cooking. ? Soak whole grains overnight and drain them before using. ? Ferment flours before baking, such as by using yeast in bread dough. Meal planning  When you eat foods that contain iron, you should eat them with foods that are high in vitamin C. These include oranges, peppers, tomatoes, potatoes, and mango. Vitamin C helps your body to absorb iron. General information  Take iron supplements only as told by your health care provider. An overdose of iron can be life-threatening. If you were prescribed iron supplements, take them with orange juice or a vitamin C supplement.  When you eat iron-fortified foods or take an iron supplement, you should also eat foods that naturally contain iron, such as meat, poultry, and fish. Eating naturally iron-rich foods helps your body to absorb the iron that is added to other foods or contained in a  supplement.  Certain foods and drinks prevent your body from absorbing iron properly. Avoid eating these foods in the same meal as iron-rich foods or with iron supplements. These foods include: ? Coffee, black tea, and red wine. ? Milk, dairy products, and foods that are high in calcium. ? Beans and soybeans. ? Whole grains. What foods should I eat? Fruits Prunes. Raisins. Eat fruits high in vitamin C, such as oranges, grapefruits, and strawberries, alongside iron-rich foods. Vegetables Spinach (cooked). Green peas. Broccoli. Fermented vegetables. Eat vegetables high in vitamin C, such as leafy greens, potatoes, bell peppers, and tomatoes, alongside iron-rich foods. Grains Iron-fortified breakfast cereal. Iron-fortified whole-wheat bread. Enriched rice. Sprouted grains. Meats and other proteins Beef liver. Oysters. Beef. Shrimp. Turkey. Chicken. Tuna. Sardines. Chickpeas. Nuts. Tofu. Pumpkin seeds. Beverages Tomato juice. Fresh orange juice. Prune juice. Hibiscus tea. Fortified instant breakfast shakes. Sweets and desserts Blackstrap molasses. Seasonings and condiments Tahini. Fermented soy sauce. Other foods Wheat germ. The items listed above may not be a complete list of recommended foods and beverages. Contact a dietitian for more information. What foods should I avoid? Grains Whole grains. Bran cereal. Bran flour. Oats. Meats and other proteins Soybeans. Products made from soy protein. Black beans. Lentils. Mung beans. Split peas. Dairy Milk. Cream. Cheese. Yogurt. Cottage cheese. Beverages Coffee. Black tea. Red wine. Sweets and desserts Cocoa. Chocolate. Ice cream. Other foods Basil. Oregano. Large amounts of parsley. The items listed above may not be a complete list of foods and beverages to avoid.   Contact a dietitian for more information. Summary  Iron is a mineral that helps your body to produce hemoglobin. Hemoglobin is a protein in red blood cells that carries  oxygen to your body's tissues.  Iron is naturally found in many foods, and many foods have iron added to them (iron-fortified foods).  When you eat foods that contain iron, you should eat them with foods that are high in vitamin C. Vitamin C helps your body to absorb iron.  Certain foods and drinks prevent your body from absorbing iron properly, such as whole grains and dairy products. You should avoid eating these foods in the same meal as iron-rich foods or with iron supplements. This information is not intended to replace advice given to you by your health care provider. Make sure you discuss any questions you have with your health care provider. Document Revised: 09/27/2017 Document Reviewed: 09/10/2017 Elsevier Patient Education  2021 Elsevier Inc.  

## 2020-11-16 DIAGNOSIS — E1122 Type 2 diabetes mellitus with diabetic chronic kidney disease: Secondary | ICD-10-CM | POA: Diagnosis not present

## 2020-11-16 DIAGNOSIS — N189 Chronic kidney disease, unspecified: Secondary | ICD-10-CM | POA: Diagnosis not present

## 2020-11-16 DIAGNOSIS — N170 Acute kidney failure with tubular necrosis: Secondary | ICD-10-CM | POA: Diagnosis not present

## 2020-11-16 DIAGNOSIS — E1129 Type 2 diabetes mellitus with other diabetic kidney complication: Secondary | ICD-10-CM | POA: Diagnosis not present

## 2020-11-16 DIAGNOSIS — R809 Proteinuria, unspecified: Secondary | ICD-10-CM | POA: Diagnosis not present

## 2020-11-17 ENCOUNTER — Ambulatory Visit (INDEPENDENT_AMBULATORY_CARE_PROVIDER_SITE_OTHER): Payer: Medicare Other | Admitting: Internal Medicine

## 2020-11-17 ENCOUNTER — Encounter: Payer: Self-pay | Admitting: Internal Medicine

## 2020-11-17 DIAGNOSIS — K529 Noninfective gastroenteritis and colitis, unspecified: Secondary | ICD-10-CM | POA: Diagnosis not present

## 2020-11-17 DIAGNOSIS — R195 Other fecal abnormalities: Secondary | ICD-10-CM

## 2020-11-17 HISTORY — PX: GIVENS CAPSULE STUDY: SHX5432

## 2020-11-17 NOTE — Progress Notes (Signed)
SN: [id number] Exp: 09-27-2021 LOT: [id number] Patient arrived for Capsule Endoscopy. Reported the prep went well. This nurse explained dietary restrictions for the next few hours. Patient verbalized understanding. Opened capsule, ensured capsule was flashing prior to the patient swallowing the capsule. Patient swallowed capsule without difficulty. Patient instructed to return to the office at 4:00 pm today for removal of the recording equipment, to call the office with any questions and if no capsule was visualized after 72 hours. No further questions by the conclusion of the visit.

## 2020-11-17 NOTE — Patient Instructions (Addendum)
You may have clear liquids beginning at 10:30 am after ingesting the capsule.   At 1 pm you may have a light lunch.   Return to our office at 4 pm to return the capsule equipment. You can return to your regular diet after returning the equipment.    You should pass the capsule in your stool 8-48 hours after ingestion. If you have not passed the capsule, after 72 hours, please contact the office at [phone].

## 2020-11-18 DIAGNOSIS — M109 Gout, unspecified: Secondary | ICD-10-CM | POA: Diagnosis not present

## 2020-11-18 DIAGNOSIS — I10 Essential (primary) hypertension: Secondary | ICD-10-CM | POA: Diagnosis not present

## 2020-11-18 DIAGNOSIS — M7022 Olecranon bursitis, left elbow: Secondary | ICD-10-CM | POA: Diagnosis not present

## 2020-11-18 DIAGNOSIS — Z299 Encounter for prophylactic measures, unspecified: Secondary | ICD-10-CM | POA: Diagnosis not present

## 2020-11-18 DIAGNOSIS — I509 Heart failure, unspecified: Secondary | ICD-10-CM | POA: Diagnosis not present

## 2020-11-21 ENCOUNTER — Other Ambulatory Visit: Payer: Self-pay

## 2020-11-21 ENCOUNTER — Encounter (HOSPITAL_COMMUNITY)
Admission: RE | Admit: 2020-11-21 | Discharge: 2020-11-21 | Disposition: A | Discharge status: Home / Self Care | Payer: Medicare Other | Source: Ambulatory Visit | Attending: Nephrology | Admitting: Nephrology

## 2020-11-21 ENCOUNTER — Telehealth: Payer: Self-pay | Admitting: Internal Medicine

## 2020-11-21 ENCOUNTER — Ambulatory Visit (HOSPITAL_COMMUNITY)
Admission: RE | Admit: 2020-11-21 | Discharge: 2020-11-21 | Disposition: A | Discharge status: Home / Self Care | Payer: Medicare Other | Source: Ambulatory Visit | Attending: Nephrology | Admitting: Nephrology

## 2020-11-21 ENCOUNTER — Encounter (HOSPITAL_COMMUNITY): Payer: Self-pay

## 2020-11-21 DIAGNOSIS — R809 Proteinuria, unspecified: Secondary | ICD-10-CM | POA: Diagnosis not present

## 2020-11-21 DIAGNOSIS — E1122 Type 2 diabetes mellitus with diabetic chronic kidney disease: Secondary | ICD-10-CM | POA: Insufficient documentation

## 2020-11-21 DIAGNOSIS — E1129 Type 2 diabetes mellitus with other diabetic kidney complication: Secondary | ICD-10-CM | POA: Diagnosis not present

## 2020-11-21 DIAGNOSIS — D631 Anemia in chronic kidney disease: Secondary | ICD-10-CM | POA: Diagnosis not present

## 2020-11-21 DIAGNOSIS — N189 Chronic kidney disease, unspecified: Secondary | ICD-10-CM | POA: Diagnosis not present

## 2020-11-21 DIAGNOSIS — I129 Hypertensive chronic kidney disease with stage 1 through stage 4 chronic kidney disease, or unspecified chronic kidney disease: Secondary | ICD-10-CM | POA: Insufficient documentation

## 2020-11-21 DIAGNOSIS — N170 Acute kidney failure with tubular necrosis: Secondary | ICD-10-CM | POA: Insufficient documentation

## 2020-11-21 DIAGNOSIS — T189XXA Foreign body of alimentary tract, part unspecified, initial encounter: Secondary | ICD-10-CM

## 2020-11-21 DIAGNOSIS — N184 Chronic kidney disease, stage 4 (severe): Secondary | ICD-10-CM | POA: Diagnosis not present

## 2020-11-21 LAB — POCT HEMOGLOBIN-HEMACUE
Hemoglobin: 13.1 g/dL (ref 12.0–15.0)
Hemoglobin: 13.6 g/dL (ref 12.0–15.0)

## 2020-11-21 MED ORDER — EPOETIN ALFA-EPBX 3000 UNIT/ML IJ SOLN: 3000.0000 [IU] | Freq: Once | INTRAMUSCULAR | Status: DC

## 2020-11-21 NOTE — Telephone Encounter (Signed)
Patient had VCE on 11/17/20.  She has not passed the capsule.  She will come in for a KUB today.  Patient aware.

## 2020-11-21 NOTE — Telephone Encounter (Signed)
Pt had a Capsule EGD done and was told to inform if she had not seen the pill in a bowel movement which she has not.

## 2020-11-23 ENCOUNTER — Telehealth: Payer: Self-pay | Admitting: Internal Medicine

## 2020-11-23 ENCOUNTER — Encounter: Payer: Self-pay | Admitting: Internal Medicine

## 2020-11-23 NOTE — Telephone Encounter (Signed)
I called patient explained that capsule endoscopy demonstrates enteritis changes and she might have Crohn's disease.  She is an uncontrolled diabetic so steroids are not an option.  I had a brief conversation with her about biologic agents.  Our plan is for her to come in and see me next week and review her situation and to come up with a treatment plan.  Please add her onto one of my open banding spots or if she could have an 1130 or a 350 appointment if that is necessary.

## 2020-11-24 ENCOUNTER — Telehealth: Payer: Self-pay | Admitting: Internal Medicine

## 2020-11-24 NOTE — Telephone Encounter (Signed)
See alternate phone note  

## 2020-11-24 NOTE — Telephone Encounter (Signed)
Patient scheduled for Monday at 3:10.  She is aware

## 2020-11-25 DIAGNOSIS — E1122 Type 2 diabetes mellitus with diabetic chronic kidney disease: Secondary | ICD-10-CM | POA: Diagnosis not present

## 2020-11-25 DIAGNOSIS — E1129 Type 2 diabetes mellitus with other diabetic kidney complication: Secondary | ICD-10-CM | POA: Diagnosis not present

## 2020-11-25 DIAGNOSIS — E872 Acidosis: Secondary | ICD-10-CM | POA: Diagnosis not present

## 2020-11-25 DIAGNOSIS — I129 Hypertensive chronic kidney disease with stage 1 through stage 4 chronic kidney disease, or unspecified chronic kidney disease: Secondary | ICD-10-CM | POA: Diagnosis not present

## 2020-11-25 DIAGNOSIS — R809 Proteinuria, unspecified: Secondary | ICD-10-CM | POA: Diagnosis not present

## 2020-11-25 DIAGNOSIS — N170 Acute kidney failure with tubular necrosis: Secondary | ICD-10-CM | POA: Diagnosis not present

## 2020-11-25 DIAGNOSIS — I5032 Chronic diastolic (congestive) heart failure: Secondary | ICD-10-CM | POA: Diagnosis not present

## 2020-11-25 DIAGNOSIS — N189 Chronic kidney disease, unspecified: Secondary | ICD-10-CM | POA: Diagnosis not present

## 2020-11-28 ENCOUNTER — Encounter: Payer: Self-pay | Admitting: Internal Medicine

## 2020-11-28 ENCOUNTER — Other Ambulatory Visit: Payer: Medicare Other

## 2020-11-28 ENCOUNTER — Ambulatory Visit (INDEPENDENT_AMBULATORY_CARE_PROVIDER_SITE_OTHER): Payer: Medicare Other | Admitting: Internal Medicine

## 2020-11-28 ENCOUNTER — Other Ambulatory Visit: Payer: Self-pay

## 2020-11-28 ENCOUNTER — Ambulatory Visit (INDEPENDENT_AMBULATORY_CARE_PROVIDER_SITE_OTHER)
Admission: RE | Admit: 2020-11-28 | Discharge: 2020-11-28 | Disposition: A | Discharge status: Home / Self Care | Payer: Medicare Other | Source: Ambulatory Visit | Attending: Internal Medicine | Admitting: Internal Medicine

## 2020-11-28 VITALS — BP 124/80 | HR 88 | Ht 62.0 in | Wt 141.2 lb

## 2020-11-28 DIAGNOSIS — K50018 Crohn's disease of small intestine with other complication: Secondary | ICD-10-CM

## 2020-11-28 DIAGNOSIS — T189XXA Foreign body of alimentary tract, part unspecified, initial encounter: Secondary | ICD-10-CM | POA: Diagnosis not present

## 2020-11-28 DIAGNOSIS — Z7960 Long term (current) use of unspecified immunomodulators and immunosuppressants: Secondary | ICD-10-CM

## 2020-11-28 DIAGNOSIS — Z0389 Encounter for observation for other suspected diseases and conditions ruled out: Secondary | ICD-10-CM | POA: Diagnosis not present

## 2020-11-28 DIAGNOSIS — Z79899 Other long term (current) drug therapy: Secondary | ICD-10-CM | POA: Diagnosis not present

## 2020-11-28 DIAGNOSIS — E1165 Type 2 diabetes mellitus with hyperglycemia: Secondary | ICD-10-CM | POA: Diagnosis not present

## 2020-11-28 DIAGNOSIS — IMO0002 Reserved for concepts with insufficient information to code with codable children: Secondary | ICD-10-CM

## 2020-11-28 DIAGNOSIS — E1129 Type 2 diabetes mellitus with other diabetic kidney complication: Secondary | ICD-10-CM

## 2020-11-28 DIAGNOSIS — K529 Noninfective gastroenteritis and colitis, unspecified: Secondary | ICD-10-CM

## 2020-11-28 NOTE — Progress Notes (Signed)
Heather [name] 70 y.o. 1951-08-18 [id number]  Assessment & Plan:   Encounter Diagnoses  Name Primary?  . Crohn's disease of small intestine with other complication ([place]) Yes  . Chronic diarrhea   . Type 2 diabetes, uncontrolled, with renal manifestation ([place])   . Long-term use of immunosuppressant medication-anticipated    Based upon the total history (extensive chart review done today showing there was a question of Crohn's disease years ago but in the interim no objective data to support it) I do think she has Crohn's disease.  She has failed budesonide in the past.  Is been very tricky to figure this out as it often is.  I am testing her as below in anticipation of starting 6-MP and Humira (adalimumab) versus Cimzia  We should do everything possible to avoid steroids given her diabetes  I have reviewed risks benefits and indications of immunomodulators therapy and biologic therapy and try to explain the nature of Crohn's disease and that is an autoimmune disorder.  I have explained specifically the chronic inflammation increases one's risk of certain cancers and it is thought that immunomodulators like 6-MP and biologic agents like adalimumab and Cimzia could also do so though treatment of the disease is appropriate.  Handouts from the Crohn's and colitis foundation of [place] were provided regarding immunomodulators therapy and biologic therapy with particular attention to the adalimumab and Cimzia and 6-MP.  I have also given her a handout explaining the nature of Crohn's disease.  Follow-up will be arranged pending the above  Orders Placed This Encounter  Procedures  . Thiopurine methyltransferase(tpmt)rbc  . Hepatitis B surface antigen  . Hepatitis B surface antibody,qualitative  . QuantiFERON-TB Gold Plus    I appreciate the opportunity to care for this patient. CC: [name], MD  Subjective:   Chief Complaint: Diarrhea, enteritis new diagnosis of  Crohn's  HPI Heather [name] has had many years of diarrhea which I without was IBS diarrhea predominant.  We recently had her do a capsule endoscopy of the small bowel and she had numerous aphthous ulcers throughout the small intestine so I think she has Crohn's disease.  I talked to her about this on the phone and decided to have her come in to review treatment options.  She is a terrible candidate for steroids due to diabetes.  We did not start budesonide and would not start prednisone either.  She continues with chronic diarrhea problems and a lot of abdominal discomfort.  She was treated with Entocort in the past without benefit.  In 2012 she had IC valve erosions and colonic biopsies with chronic inflammatory changes and I entertained the possibility of Crohn's disease.  She did have inflammatory changes in the proximal small bowel and a capsule endoscopy.  Budesonide treatment did not help.  She was hospitalized at [facility] in 2012 and got some IV Solu-Medrol when she was admitted with nausea vomiting and bloating and improved.  Her blood sugars went skyhigh.  She was lost to follow-up then came back to GI in 2017 when she was transferred from [facility] and Dr. Michail Sermon found a pyloric ulcer.  Because of persistent diarrhea I repeated a colonoscopy and did not find any inflammation including random biopsies.  That is when I pursued a more aggressive course of treatment for IBS.  Same in May 2021.  Repeat colonoscopy unrevealing.  Treated with dicyclomine.  At one point she was also hospitalized with salmonellosis at [facility] as well.  Prior treatments have  also included alosetron and ondansetron and Xifaxan and none of that made a significant difference.  She is also been on Creon with plus minus benefit but really still has numerous loose stools a day.  Celiac testing negative.  She does have a history of pancreatitis related to Victoza and also has hypertriglyceridemia. Allergies  Allergen  Reactions  . Duloxetine Hcl Swelling  . Fluocinolone Swelling  . Acetaminophen Itching and Swelling    Pt states "some swelling"  . Amlodipine Besylate Swelling    Swelling of feet, fluid retention  . Ciprofloxacin Nausea And Vomiting and Swelling    Swelling of face, jaw, and lips  . Hydrocodone-Acetaminophen Itching  . Metformin Other (See Comments)    REACTION: GI UPSET  . Nsaids Other (See Comments)    "HAS BLEEDING ULCERS"  . Quinolones Swelling  . Sulfamethoxazole Swelling    Swelling of feet, legs  . Valsartan Swelling  . Cozaar [Losartan] Swelling and Rash  . Lisinopril Swelling and Rash    Oral swelling, and red streaks on arms/stomach  . Tape Itching and Rash    Paper tape can only be used on this patient   Current Meds  Medication Sig  . acetaminophen (TYLENOL) 650 MG CR tablet Take 650 mg by mouth every 8 (eight) hours as needed for pain.  Marland Kitchen allopurinol (ZYLOPRIM) 100 MG tablet Take 100 mg by mouth daily.  Marland Kitchen amLODipine (NORVASC) 5 MG tablet Take 5 mg by mouth daily.  . Ascorbic Acid (VITAMIN C WITH ROSE HIPS) 500 MG tablet Take 500 mg by mouth every morning.  Marland Kitchen aspirin EC 81 MG tablet Take 81 mg by mouth every morning.   Marland Kitchen atorvastatin (LIPITOR) 80 MG tablet Take 80 mg by mouth every morning.  . butalbital-acetaminophen-caffeine (FIORICET) 50-325-40 MG tablet Take 1 tablet by mouth every 6 (six) hours as needed for migraine.  . calcium carbonate (OS-CAL) 1250 (500 Ca) MG chewable tablet Chew 2 tablets by mouth daily.  . Cholecalciferol (VITAMIN D) 2000 units CAPS Take 2,000 Units by mouth every morning.   . citalopram (CELEXA) 40 MG tablet Take 40 mg by mouth daily.  Marland Kitchen CREON 36000-114000 units CPEP capsule Take 2 capsules (72,000 Units total) by mouth 3 (three) times daily.  . cyanocobalamin (,VITAMIN B-12,) 1000 MCG/ML injection Inject 1,000 mcg into the muscle every 30 (thirty) days.    Marland Kitchen dicyclomine (BENTYL) 20 MG tablet Take 1 tablet (20 mg total) by mouth every  6 (six) hours.  . diphenoxylate-atropine (LOMOTIL) 2.5-0.025 MG tablet 1-2 tablets qid prn diarrhea Instead of Imodium  . epoetin alfa-epbx (RETACRIT) 3000 UNIT/ML injection Inject 3,000 Units into the skin every 14 (fourteen) days.  Marland Kitchen esomeprazole (NEXIUM) 40 MG capsule Take 40 mg by mouth daily before breakfast.    . fenofibrate (TRICOR) 145 MG tablet Take 1 tablet (145 mg total) by mouth daily.  . furosemide (LASIX) 40 MG tablet Take 1 tablet (40 mg total) by mouth daily. (Patient taking differently: Take 40 mg by mouth 2 (two) times daily.)  . gabapentin (NEURONTIN) 300 MG capsule Take 300 mg by mouth 3 (three) times daily.  . hydrALAZINE (APRESOLINE) 100 MG tablet Take 1 tablet (100 mg total) by mouth every 8 (eight) hours. (Patient taking differently: Take 100 mg by mouth 3 (three) times daily.)  . hydrocortisone 2.5 % cream Apply topically 2 (two) times daily.  . indomethacin (INDOCIN) 25 MG capsule Take 1 capsule by mouth as needed.  . insulin detemir (LEVEMIR FLEXTOUCH) 100  UNIT/ML FlexPen Inject 20 Units into the skin at bedtime. Patient adjust for her sugars  . Insulin Lispro (HUMALOG KWIKPEN [place]) Inject 20 Units into the skin 3 (three) times daily with meals. Sliding scale  . labetalol (NORMODYNE) 300 MG tablet Take 1 tablet (300 mg total) by mouth 2 (two) times daily.  . meclizine (ANTIVERT) 25 MG tablet Take 1 tablet by mouth as needed.  . methocarbamol (ROBAXIN) 500 MG tablet Take 1 tablet (500 mg total) by mouth every 6 (six) hours as needed for muscle spasms.  . metoprolol tartrate (LOPRESSOR) 100 MG tablet Take 100 mg by mouth 2 (two) times daily.  Marland Kitchen nystatin (MYCOSTATIN) 100000 UNIT/ML suspension Take 20 mLs by mouth as needed.  Marland Kitchen omega-3 acid ethyl esters (LOVAZA) 1 g capsule Take 2 capsules by mouth 2 (two) times daily.  . ondansetron (ZOFRAN ODT) 4 MG disintegrating tablet Take 1 tablet (4 mg total) by mouth every 8 (eight) hours as needed.  . ondansetron (ZOFRAN) 4 MG tablet  Take 4 mg by mouth every 8 (eight) hours as needed for nausea.  . potassium chloride SA (K-DUR,KLOR-CON) 20 MEQ tablet Take 20 mEq by mouth every morning.   . promethazine (PHENERGAN) 25 MG tablet Take 25 mg by mouth as needed.  . Semaglutide,0.25 or 0.5MG /DOS, (OZEMPIC, 0.25 OR 0.5 MG/DOSE,) 2 MG/1.5ML SOPN Inject 2 mg into the skin every Sunday.   . tiZANidine (ZANAFLEX) 4 MG tablet Take 8 mg by mouth at bedtime.  . venlafaxine XR (EFFEXOR-XR) 75 MG 24 hr capsule Take 75 mg by mouth daily.  . zolpidem (AMBIEN) 10 MG tablet Take 10 mg by mouth at bedtime.  . [DISCONTINUED] epoetin alfa (EPOGEN) 3000 UNIT/ML injection Inject into the skin every 14 (fourteen) days.   Past Medical History:  Diagnosis Date  . Acute renal failure superimposed on stage 3 chronic kidney disease (HCC) 01/21/2016  . Anemia   . Anxiety   . B12 deficiency   . Brain tumor (benign) (HCC) 10/29/2020  . Cellulitis and abscess    Abdomen and buttocks  . CKD (chronic kidney disease) stage 3, GFR 30-59 ml/min (HCC)   . Closed fracture of distal end of femur, unspecified fracture morphology, initial encounter (HCC)   . Depression   . Diabetic neuropathy (HCC)    feet  . DJD (degenerative joint disease)    Right forminal stenosis C4-5  . Essential hypertension   . Femur fracture, right (HCC) 03/30/2017  . Folliculitis   . GERD (gastroesophageal reflux disease)   . Gout   . Headache(784.0)   . Hiatal hernia   . History of blood transfusion   . History of bronchitis   . History of cardiac catheterization    No significant CAD 2012  . History of kidney stones    surgery to remove  . Insomnia   . Irritable bowel syndrome   . Mixed hyperlipidemia   . Osteoarthritis   . Palpitations   . Reflux esophagitis   . Tinnitus    Right  . Type 2 diabetes mellitus (HCC)    Past Surgical History:  Procedure Laterality Date  . BACK SURGERY    . c-spine surgery     06 /2008  . CARDIAC CATHETERIZATION    . CHOLECYSTECTOMY     . COLONOSCOPY  04/21/2011; 05/29/11   6/12: [place] - ?AVM at IC valve, inflammatory changes at [facility] valve [facility]); 7/12 - [facility]; IC valve erosions, look chronic and probably Crohn's per path  . colonoscopy  2017   [place]: random biopsies normal. TI normal  . ESOPHAGOGASTRODUODENOSCOPY  05/29/11   normal  . ESOPHAGOGASTRODUODENOSCOPY N/A 01/21/2016   Dr. Michail Sermon: normal  . EYE SURGERY Bilateral    cataracts removed  . FEMUR IM NAIL Right 04/10/2018   Procedure: INTRAMEDULLARY (IM) NAIL FEMORAL;  Surgeon: Paralee Cancel, MD;  Location: [facility];  Service: Orthopedics;  Laterality: Right;  . GIVENS CAPSULE STUDY  11/17/2020  . HARDWARE REMOVAL Right 04/10/2018   Procedure: HARDWARE REMOVAL RIGHT DISTAL FEMUR;  Surgeon: Paralee Cancel, MD;  Location: [facility];  Service: Orthopedics;  Laterality: Right;  . IR FLUORO GUIDE CV LINE RIGHT  04/01/2017  . IR US GUIDE VASC ACCESS RIGHT  04/01/2017  . JOINT REPLACEMENT Right    hip  . OPEN REDUCTION INTERNAL FIXATION (ORIF) DISTAL RADIAL FRACTURE Right 05/28/2019   Procedure: OPEN REDUCTION INTERNAL FIXATION (ORIF) DISTAL RADIAL FRACTURE;  Surgeon: Verner Mould, MD;  Location: [place];  Service: Orthopedics;  Laterality: Right;  with block 90 minutes  . ORIF FEMUR FRACTURE Right 03/31/2017   Procedure: OPEN REDUCTION INTERNAL FIXATION (ORIF) DISTAL FEMUR FRACTURE;  Surgeon: Paralee Cancel, MD;  Location: [place];  Service: Orthopedics;  Laterality: Right;  . removal of kidney stone    . Right knee arthroscopic surgery    . TONSILLECTOMY    . 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   Review of Systems See  HPI  Objective:   Physical Exam BP 124/80 (BP Location: Left Arm, Patient Position: Sitting, Cuff Size: Normal)   Pulse 88   Ht 5\' 2"  (1.575 m)   Wt 141 lb 4 oz (64.1 kg)   LMP  (LMP Unknown)   BMI 25.83 kg/m  Lungs clear Heart sounds normal Abdomen soft diffusely mildly tender bowel sounds present no organomegaly or mass Alert and oriented x3 Appropriate mood and affect  Total time on this visit in preparation, with the patient and afterwards in documentation, 42 minutes

## 2020-11-28 NOTE — Patient Instructions (Signed)

## 2020-11-30 ENCOUNTER — Telehealth: Payer: Self-pay | Admitting: Internal Medicine

## 2020-11-30 DIAGNOSIS — K50018 Crohn's disease of small intestine with other complication: Secondary | ICD-10-CM

## 2020-11-30 NOTE — Telephone Encounter (Signed)
Any thoughts to what biologic you want to try??  Most of her lab is back

## 2020-11-30 NOTE — Telephone Encounter (Signed)
Patient calling for capsule results

## 2020-12-01 MED ORDER — HUMIRA-CD/UC/HS STARTER 80 MG/0.8ML ~~LOC~~ AJKT: 160.0000 mg | AUTO-INJECTOR | Freq: Once | SUBCUTANEOUS | 0 refills | Status: DC

## 2020-12-01 MED ORDER — HUMIRA (2 PEN) 40 MG/0.4ML ~~LOC~~ AJKT: 40.0000 mg | AUTO-INJECTOR | SUBCUTANEOUS | 6 refills | Status: DC

## 2020-12-01 NOTE — Telephone Encounter (Signed)
Patient notified that KUB was negative.  She has passed the capsule.  We discussed new Humira medications and that I will begin process to get it approved with her insurance.  She knows how to self inject.  She is aware I will call her back once approved to discuss specifics and the dosing for Humira.

## 2020-12-01 NOTE — Telephone Encounter (Signed)
Medicaid starts with Humira so prescribe a starter pack and maintenance dose for Crohn's

## 2020-12-02 ENCOUNTER — Telehealth: Payer: Self-pay | Admitting: Internal Medicine

## 2020-12-02 NOTE — Telephone Encounter (Signed)
Seen 11/28/2020 [name], any suggestions?

## 2020-12-02 NOTE — Telephone Encounter (Signed)
Make sure she is using Lomotil and or dicyclomine. I do not really have others to add to that

## 2020-12-02 NOTE — Telephone Encounter (Signed)
I informed Heather [name] as Dr Carlean Purl advised and she wrote down the name of the medicines to see if she has those. They were sent in to her pharmacy late December and early January.I told her we are waiting on one of her blood test to come back and then we will be in touch with results and plans.

## 2020-12-05 ENCOUNTER — Telehealth: Payer: Self-pay | Admitting: Internal Medicine

## 2020-12-05 DIAGNOSIS — R197 Diarrhea, unspecified: Secondary | ICD-10-CM

## 2020-12-05 LAB — QUANTIFERON-TB GOLD PLUS
Mitogen-NIL: >10 IU/mL
NIL: 0.03 IU/mL
QuantiFERON-TB Gold Plus: NEGATIVE
TB1-NIL: 0.01 IU/mL
TB2-NIL: 0.02 IU/mL

## 2020-12-05 LAB — THIOPURINE METHYLTRANSFERASE (TPMT), RBC: Thiopurine Methyltransferase, RBC: 22 nmol/hr/mL RBC

## 2020-12-05 LAB — HEPATITIS B SURFACE ANTIBODY,QUALITATIVE: Hep B S Ab: REACTIVE — AB

## 2020-12-05 LAB — HEPATITIS B SURFACE ANTIGEN: Hepatitis B Surface Ag: NONREACTIVE

## 2020-12-05 NOTE — Telephone Encounter (Signed)
Left message on machine to call back  

## 2020-12-05 NOTE — Telephone Encounter (Signed)
Pt is requesting a call back from a nurse, pt states she is still experiencing diarrhea with bleeding involved, pt would like to discuss further with the nurse.

## 2020-12-06 ENCOUNTER — Encounter (HOSPITAL_COMMUNITY)
Admission: RE | Admit: 2020-12-06 | Discharge: 2020-12-06 | Disposition: A | Discharge status: Home / Self Care | Payer: 59 | Source: Ambulatory Visit | Attending: Nephrology | Admitting: Nephrology

## 2020-12-06 ENCOUNTER — Inpatient Hospital Stay (HOSPITAL_COMMUNITY): Admission: RE | Admit: 2020-12-06 | Payer: Medicare Other | Source: Ambulatory Visit

## 2020-12-06 ENCOUNTER — Encounter (HOSPITAL_COMMUNITY): Admission: RE | Admit: 2020-12-06 | Payer: Medicare Other | Source: Ambulatory Visit

## 2020-12-06 MED ORDER — DIPHENOXYLATE-ATROPINE 2.5-0.025 MG PO TABS: ORAL_TABLET | ORAL | 1 refills | Status: DC

## 2020-12-06 NOTE — Telephone Encounter (Signed)
Pt is returning a missed call from the nurse. 

## 2020-12-06 NOTE — Telephone Encounter (Signed)
Please send to [facility]. I was only covering for her yesterday.

## 2020-12-06 NOTE — Telephone Encounter (Signed)
Patient picked up the starter kit last week on 2/4

## 2020-12-06 NOTE — Telephone Encounter (Signed)
I called her.  Watery diarrhea for 5 days which is worse than usual.  I have ordered a C. difficile test.  We will plan to start 6-MP on her as her thio purine metabolites are okay but I am holding off on that right now.  I want to see what is going on with the C. difficile test we also working on Humira.  She is out of Lomotil I will refill

## 2020-12-07 NOTE — Telephone Encounter (Signed)
Patient has her Humira and started on it last week.

## 2020-12-09 ENCOUNTER — Other Ambulatory Visit: Payer: Medicaid Other

## 2020-12-09 DIAGNOSIS — R197 Diarrhea, unspecified: Secondary | ICD-10-CM

## 2020-12-12 LAB — CLOSTRIDIUM DIFFICILE TOXIN B, QUALITATIVE, REAL-TIME PCR: Toxigenic C. Difficile by PCR: NOT DETECTED

## 2020-12-13 ENCOUNTER — Other Ambulatory Visit: Payer: Self-pay | Admitting: Internal Medicine

## 2020-12-13 DIAGNOSIS — K50018 Crohn's disease of small intestine with other complication: Secondary | ICD-10-CM

## 2020-12-15 ENCOUNTER — Other Ambulatory Visit: Payer: Self-pay | Admitting: Internal Medicine

## 2020-12-23 ENCOUNTER — Encounter (HOSPITAL_COMMUNITY): Payer: Self-pay

## 2020-12-23 ENCOUNTER — Encounter (HOSPITAL_COMMUNITY)
Admission: RE | Admit: 2020-12-23 | Discharge: 2020-12-23 | Disposition: A | Discharge status: Home / Self Care | Payer: 59 | Source: Ambulatory Visit | Attending: Nephrology | Admitting: Nephrology

## 2020-12-23 ENCOUNTER — Other Ambulatory Visit: Payer: Self-pay

## 2020-12-23 DIAGNOSIS — D631 Anemia in chronic kidney disease: Secondary | ICD-10-CM | POA: Diagnosis not present

## 2020-12-23 DIAGNOSIS — N185 Chronic kidney disease, stage 5: Secondary | ICD-10-CM | POA: Diagnosis present

## 2020-12-23 LAB — POCT HEMOGLOBIN-HEMACUE: Hemoglobin: 11.6 g/dL — ABNORMAL LOW (ref 12.0–15.0)

## 2020-12-23 MED ORDER — EPOETIN ALFA-EPBX 3000 UNIT/ML IJ SOLN: 3000.0000 [IU] | Freq: Once | INTRAMUSCULAR | Status: DC

## 2021-01-05 ENCOUNTER — Ambulatory Visit: Payer: Medicare Other | Admitting: Internal Medicine

## 2021-01-06 ENCOUNTER — Other Ambulatory Visit: Payer: Self-pay

## 2021-01-06 ENCOUNTER — Inpatient Hospital Stay (HOSPITAL_COMMUNITY)
Admission: EM | Admit: 2021-01-06 | Discharge: 2021-01-12 | DRG: 439 | Disposition: A | Discharge status: Home / Self Care | Payer: 59 | Attending: Internal Medicine | Admitting: Internal Medicine

## 2021-01-06 ENCOUNTER — Encounter (HOSPITAL_COMMUNITY): Payer: Self-pay | Admitting: *Deleted

## 2021-01-06 ENCOUNTER — Encounter (HOSPITAL_COMMUNITY)
Admission: RE | Admit: 2021-01-06 | Discharge: 2021-01-06 | Disposition: A | Discharge status: Home / Self Care | Payer: 59 | Source: Ambulatory Visit | Attending: Nephrology | Admitting: Nephrology

## 2021-01-06 DIAGNOSIS — E1122 Type 2 diabetes mellitus with diabetic chronic kidney disease: Secondary | ICD-10-CM | POA: Diagnosis present

## 2021-01-06 DIAGNOSIS — Z7982 Long term (current) use of aspirin: Secondary | ICD-10-CM

## 2021-01-06 DIAGNOSIS — K909 Intestinal malabsorption, unspecified: Secondary | ICD-10-CM | POA: Diagnosis not present

## 2021-01-06 DIAGNOSIS — D649 Anemia, unspecified: Secondary | ICD-10-CM | POA: Diagnosis not present

## 2021-01-06 DIAGNOSIS — K50018 Crohn's disease of small intestine with other complication: Secondary | ICD-10-CM

## 2021-01-06 DIAGNOSIS — R197 Diarrhea, unspecified: Secondary | ICD-10-CM | POA: Diagnosis not present

## 2021-01-06 DIAGNOSIS — I959 Hypotension, unspecified: Secondary | ICD-10-CM | POA: Diagnosis present

## 2021-01-06 DIAGNOSIS — K219 Gastro-esophageal reflux disease without esophagitis: Secondary | ICD-10-CM | POA: Diagnosis present

## 2021-01-06 DIAGNOSIS — G8929 Other chronic pain: Secondary | ICD-10-CM | POA: Diagnosis present

## 2021-01-06 DIAGNOSIS — K5000 Crohn's disease of small intestine without complications: Secondary | ICD-10-CM | POA: Diagnosis present

## 2021-01-06 DIAGNOSIS — F419 Anxiety disorder, unspecified: Secondary | ICD-10-CM | POA: Diagnosis present

## 2021-01-06 DIAGNOSIS — E860 Dehydration: Secondary | ICD-10-CM | POA: Diagnosis present

## 2021-01-06 DIAGNOSIS — R1013 Epigastric pain: Secondary | ICD-10-CM | POA: Diagnosis not present

## 2021-01-06 DIAGNOSIS — K861 Other chronic pancreatitis: Secondary | ICD-10-CM | POA: Diagnosis present

## 2021-01-06 DIAGNOSIS — Z833 Family history of diabetes mellitus: Secondary | ICD-10-CM

## 2021-01-06 DIAGNOSIS — Z79899 Other long term (current) drug therapy: Secondary | ICD-10-CM

## 2021-01-06 DIAGNOSIS — R112 Nausea with vomiting, unspecified: Secondary | ICD-10-CM

## 2021-01-06 DIAGNOSIS — E11649 Type 2 diabetes mellitus with hypoglycemia without coma: Secondary | ICD-10-CM | POA: Diagnosis not present

## 2021-01-06 DIAGNOSIS — N184 Chronic kidney disease, stage 4 (severe): Secondary | ICD-10-CM

## 2021-01-06 DIAGNOSIS — Z794 Long term (current) use of insulin: Secondary | ICD-10-CM

## 2021-01-06 DIAGNOSIS — E1165 Type 2 diabetes mellitus with hyperglycemia: Secondary | ICD-10-CM | POA: Diagnosis present

## 2021-01-06 DIAGNOSIS — D631 Anemia in chronic kidney disease: Secondary | ICD-10-CM | POA: Diagnosis present

## 2021-01-06 DIAGNOSIS — K859 Acute pancreatitis without necrosis or infection, unspecified: Secondary | ICD-10-CM | POA: Diagnosis present

## 2021-01-06 DIAGNOSIS — E538 Deficiency of other specified B group vitamins: Secondary | ICD-10-CM | POA: Diagnosis present

## 2021-01-06 DIAGNOSIS — Z882 Allergy status to sulfonamides status: Secondary | ICD-10-CM

## 2021-01-06 DIAGNOSIS — B373 Candidiasis of vulva and vagina: Secondary | ICD-10-CM | POA: Diagnosis present

## 2021-01-06 DIAGNOSIS — I129 Hypertensive chronic kidney disease with stage 1 through stage 4 chronic kidney disease, or unspecified chronic kidney disease: Secondary | ICD-10-CM | POA: Diagnosis present

## 2021-01-06 DIAGNOSIS — I10 Essential (primary) hypertension: Secondary | ICD-10-CM | POA: Diagnosis present

## 2021-01-06 DIAGNOSIS — N183 Chronic kidney disease, stage 3 unspecified: Secondary | ICD-10-CM | POA: Diagnosis present

## 2021-01-06 DIAGNOSIS — Z20822 Contact with and (suspected) exposure to covid-19: Secondary | ICD-10-CM | POA: Diagnosis present

## 2021-01-06 DIAGNOSIS — Z888 Allergy status to other drugs, medicaments and biological substances status: Secondary | ICD-10-CM

## 2021-01-06 DIAGNOSIS — F32A Depression, unspecified: Secondary | ICD-10-CM | POA: Diagnosis present

## 2021-01-06 DIAGNOSIS — Z66 Do not resuscitate: Secondary | ICD-10-CM | POA: Diagnosis present

## 2021-01-06 DIAGNOSIS — E782 Mixed hyperlipidemia: Secondary | ICD-10-CM | POA: Diagnosis present

## 2021-01-06 DIAGNOSIS — Z886 Allergy status to analgesic agent status: Secondary | ICD-10-CM

## 2021-01-06 DIAGNOSIS — A044 Other intestinal Escherichia coli infections: Secondary | ICD-10-CM

## 2021-01-06 DIAGNOSIS — E1140 Type 2 diabetes mellitus with diabetic neuropathy, unspecified: Secondary | ICD-10-CM | POA: Diagnosis present

## 2021-01-06 DIAGNOSIS — Z8673 Personal history of transient ischemic attack (TIA), and cerebral infarction without residual deficits: Secondary | ICD-10-CM

## 2021-01-06 DIAGNOSIS — E876 Hypokalemia: Secondary | ICD-10-CM | POA: Diagnosis present

## 2021-01-06 DIAGNOSIS — K858 Other acute pancreatitis without necrosis or infection: Secondary | ICD-10-CM | POA: Diagnosis not present

## 2021-01-06 DIAGNOSIS — E119 Type 2 diabetes mellitus without complications: Secondary | ICD-10-CM | POA: Diagnosis not present

## 2021-01-06 DIAGNOSIS — N1832 Chronic kidney disease, stage 3b: Secondary | ICD-10-CM | POA: Diagnosis present

## 2021-01-06 DIAGNOSIS — E875 Hyperkalemia: Secondary | ICD-10-CM | POA: Diagnosis not present

## 2021-01-06 DIAGNOSIS — K8500 Idiopathic acute pancreatitis without necrosis or infection: Secondary | ICD-10-CM | POA: Diagnosis not present

## 2021-01-06 DIAGNOSIS — Z881 Allergy status to other antibiotic agents status: Secondary | ICD-10-CM

## 2021-01-06 DIAGNOSIS — D539 Nutritional anemia, unspecified: Secondary | ICD-10-CM

## 2021-01-06 DIAGNOSIS — N179 Acute kidney failure, unspecified: Secondary | ICD-10-CM | POA: Diagnosis present

## 2021-01-06 DIAGNOSIS — K580 Irritable bowel syndrome with diarrhea: Secondary | ICD-10-CM | POA: Diagnosis not present

## 2021-01-06 DIAGNOSIS — K589 Irritable bowel syndrome without diarrhea: Secondary | ICD-10-CM | POA: Diagnosis present

## 2021-01-06 DIAGNOSIS — E1121 Type 2 diabetes mellitus with diabetic nephropathy: Secondary | ICD-10-CM | POA: Diagnosis not present

## 2021-01-06 DIAGNOSIS — M109 Gout, unspecified: Secondary | ICD-10-CM | POA: Diagnosis present

## 2021-01-06 DIAGNOSIS — R0600 Dyspnea, unspecified: Secondary | ICD-10-CM

## 2021-01-06 DIAGNOSIS — Z885 Allergy status to narcotic agent status: Secondary | ICD-10-CM

## 2021-01-06 DIAGNOSIS — Z91048 Other nonmedicinal substance allergy status: Secondary | ICD-10-CM

## 2021-01-06 DIAGNOSIS — R059 Cough, unspecified: Secondary | ICD-10-CM | POA: Diagnosis present

## 2021-01-06 DIAGNOSIS — G4700 Insomnia, unspecified: Secondary | ICD-10-CM | POA: Diagnosis present

## 2021-01-06 DIAGNOSIS — Z86011 Personal history of benign neoplasm of the brain: Secondary | ICD-10-CM

## 2021-01-06 DIAGNOSIS — R0602 Shortness of breath: Secondary | ICD-10-CM | POA: Diagnosis present

## 2021-01-06 HISTORY — DX: Pneumonia, unspecified organism: J18.9

## 2021-01-06 HISTORY — DX: Cerebral infarction, unspecified: I63.9

## 2021-01-06 LAB — CBC WITH DIFFERENTIAL/PLATELET
Abs Immature Granulocytes: 0.31 10*3/uL — ABNORMAL HIGH (ref 0.00–0.07)
Basophils Absolute: 0.1 10*3/uL (ref 0.0–0.1)
Basophils Relative: 1 %
Eosinophils Absolute: 0.4 10*3/uL (ref 0.0–0.5)
Eosinophils Relative: 3 %
HCT: 30.3 % — ABNORMAL LOW (ref 36.0–46.0)
Hemoglobin: 10.2 g/dL — ABNORMAL LOW (ref 12.0–15.0)
Immature Granulocytes: 2 %
Lymphocytes Relative: 21 %
Lymphs Abs: 2.7 10*3/uL (ref 0.7–4.0)
MCH: 30.2 pg (ref 26.0–34.0)
MCHC: 33.7 g/dL (ref 30.0–36.0)
MCV: 89.6 fL (ref 80.0–100.0)
Monocytes Absolute: 0.7 10*3/uL (ref 0.1–1.0)
Monocytes Relative: 6 %
Neutro Abs: 8.7 10*3/uL — ABNORMAL HIGH (ref 1.7–7.7)
Neutrophils Relative %: 67 %
Platelets: 310 10*3/uL (ref 150–400)
RBC: 3.38 MIL/uL — ABNORMAL LOW (ref 3.87–5.11)
RDW: 13.6 % (ref 11.5–15.5)
WBC: 12.9 10*3/uL — ABNORMAL HIGH (ref 4.0–10.5)
nRBC: 0 % (ref 0.0–0.2)

## 2021-01-06 LAB — CBG MONITORING, ED
Glucose-Capillary: 66 mg/dL — ABNORMAL LOW (ref 70–99)
Glucose-Capillary: 70 mg/dL (ref 70–99)

## 2021-01-06 LAB — COMPREHENSIVE METABOLIC PANEL
ALT: 15 U/L (ref 0–44)
AST: 18 U/L (ref 15–41)
Albumin: 3.7 g/dL (ref 3.5–5.0)
Alkaline Phosphatase: 91 U/L (ref 38–126)
Anion gap: 16 — ABNORMAL HIGH (ref 5–15)
BUN: 28 mg/dL — ABNORMAL HIGH (ref 8–23)
CO2: 24 mmol/L (ref 22–32)
Calcium: 9.3 mg/dL (ref 8.9–10.3)
Chloride: 94 mmol/L — ABNORMAL LOW (ref 98–111)
Creatinine, Ser: 2.04 mg/dL — ABNORMAL HIGH (ref 0.44–1.00)
GFR, Estimated: 26 mL/min — ABNORMAL LOW (ref >60)
Glucose, Bld: 100 mg/dL — ABNORMAL HIGH (ref 70–99)
Potassium: 2.3 mmol/L — CL (ref 3.5–5.1)
Sodium: 134 mmol/L — ABNORMAL LOW (ref 135–145)
Total Bilirubin: 0.2 mg/dL — ABNORMAL LOW (ref 0.3–1.2)
Total Protein: 7.7 g/dL (ref 6.5–8.1)

## 2021-01-06 LAB — C-REACTIVE PROTEIN: CRP: 1.2 mg/dL — ABNORMAL HIGH (ref <1.0)

## 2021-01-06 LAB — GLUCOSE, CAPILLARY: Glucose-Capillary: 167 mg/dL — ABNORMAL HIGH (ref 70–99)

## 2021-01-06 LAB — SEDIMENTATION RATE: Sed Rate: 77 mm/hr — ABNORMAL HIGH (ref 0–22)

## 2021-01-06 LAB — RESP PANEL BY RT-PCR (FLU A&B, COVID) ARPGX2
Influenza A by PCR: NEGATIVE
Influenza B by PCR: NEGATIVE
SARS Coronavirus 2 by RT PCR: NEGATIVE

## 2021-01-06 LAB — MAGNESIUM: Magnesium: 1.3 mg/dL — ABNORMAL LOW (ref 1.7–2.4)

## 2021-01-06 MED ORDER — PROMETHAZINE HCL 12.5 MG PO TABS: 25.0000 mg | ORAL_TABLET | ORAL | Status: DC | PRN

## 2021-01-06 MED ORDER — MORPHINE SULFATE (PF) 2 MG/ML IV SOLN
2.0000 mg | Freq: Once | INTRAVENOUS | Status: AC
Start: 2021-01-06 — End: 2021-01-06
  Administered 2021-01-06: 2 mg via INTRAVENOUS
  Filled 2021-01-06: qty 1

## 2021-01-06 MED ORDER — TIZANIDINE HCL 4 MG PO TABS
8.0000 mg | ORAL_TABLET | Freq: Every day | ORAL | Status: DC
  Administered 2021-01-08: 8 mg via ORAL
  Filled 2021-01-06 (×4): qty 2

## 2021-01-06 MED ORDER — LABETALOL HCL 200 MG PO TABS
300.0000 mg | ORAL_TABLET | Freq: Two times a day (BID) | ORAL | Status: DC
  Administered 2021-01-06: 300 mg via ORAL
  Filled 2021-01-06: qty 2

## 2021-01-06 MED ORDER — PANTOPRAZOLE SODIUM 40 MG PO TBEC
40.0000 mg | DELAYED_RELEASE_TABLET | Freq: Every day | ORAL | Status: DC
  Administered 2021-01-07 – 2021-01-12 (×6): 40 mg via ORAL
  Filled 2021-01-06 (×6): qty 1

## 2021-01-06 MED ORDER — DICYCLOMINE HCL 20 MG PO TABS
20.0000 mg | ORAL_TABLET | Freq: Four times a day (QID) | ORAL | Status: DC
  Filled 2021-01-06 (×11): qty 1

## 2021-01-06 MED ORDER — POTASSIUM CHLORIDE 10 MEQ/100ML IV SOLN
10.0000 meq | INTRAVENOUS | Status: AC
  Administered 2021-01-06 (×3): 10 meq via INTRAVENOUS
  Filled 2021-01-06 (×4): qty 100

## 2021-01-06 MED ORDER — ONDANSETRON HCL 4 MG PO TABS
4.0000 mg | ORAL_TABLET | Freq: Three times a day (TID) | ORAL | Status: DC | PRN
  Administered 2021-01-11: 4 mg via ORAL
  Filled 2021-01-06: qty 1

## 2021-01-06 MED ORDER — AMLODIPINE BESYLATE 5 MG PO TABS: 5.0000 mg | ORAL_TABLET | Freq: Every day | ORAL | Status: DC

## 2021-01-06 MED ORDER — ENOXAPARIN SODIUM 30 MG/0.3ML ~~LOC~~ SOLN
30.0000 mg | SUBCUTANEOUS | Status: DC
  Administered 2021-01-06 – 2021-01-11 (×6): 30 mg via SUBCUTANEOUS
  Filled 2021-01-06 (×6): qty 0.3

## 2021-01-06 MED ORDER — SODIUM CHLORIDE 0.9 % IV BOLUS
1000.0000 mL | Freq: Once | INTRAVENOUS | Status: AC
  Administered 2021-01-06: 1000 mL via INTRAVENOUS

## 2021-01-06 MED ORDER — HYDRALAZINE HCL 20 MG/ML IJ SOLN
10.0000 mg | Freq: Once | INTRAMUSCULAR | Status: AC
  Administered 2021-01-06: 10 mg via INTRAVENOUS
  Filled 2021-01-06: qty 1

## 2021-01-06 MED ORDER — ONDANSETRON HCL 4 MG/2ML IJ SOLN
4.0000 mg | Freq: Once | INTRAMUSCULAR | Status: AC
  Administered 2021-01-06: 4 mg via INTRAVENOUS
  Filled 2021-01-06: qty 2

## 2021-01-06 MED ORDER — ATORVASTATIN CALCIUM 40 MG PO TABS
80.0000 mg | ORAL_TABLET | Freq: Every morning | ORAL | Status: DC
  Administered 2021-01-07 – 2021-01-12 (×6): 80 mg via ORAL
  Filled 2021-01-06 (×6): qty 2

## 2021-01-06 MED ORDER — SODIUM CHLORIDE 0.9 % IV BOLUS
500.0000 mL | Freq: Once | INTRAVENOUS | Status: AC
  Administered 2021-01-06: 500 mL via INTRAVENOUS

## 2021-01-06 MED ORDER — FUROSEMIDE 40 MG PO TABS: 40.0000 mg | ORAL_TABLET | Freq: Two times a day (BID) | ORAL | Status: DC

## 2021-01-06 MED ORDER — INSULIN ASPART 100 UNIT/ML ~~LOC~~ SOLN: 20.0000 [IU] | Freq: Three times a day (TID) | SUBCUTANEOUS | Status: DC

## 2021-01-06 MED ORDER — VENLAFAXINE HCL ER 75 MG PO CP24
75.0000 mg | ORAL_CAPSULE | Freq: Every day | ORAL | Status: DC
  Administered 2021-01-07 – 2021-01-12 (×6): 75 mg via ORAL
  Filled 2021-01-06 (×6): qty 1

## 2021-01-06 MED ORDER — GABAPENTIN 300 MG PO CAPS
300.0000 mg | ORAL_CAPSULE | Freq: Three times a day (TID) | ORAL | Status: DC
  Administered 2021-01-06 – 2021-01-12 (×18): 300 mg via ORAL
  Filled 2021-01-06 (×18): qty 1

## 2021-01-06 MED ORDER — INSULIN ASPART 100 UNIT/ML ~~LOC~~ SOLN
0.0000 [IU] | Freq: Three times a day (TID) | SUBCUTANEOUS | Status: DC
  Administered 2021-01-07 (×3): 5 [IU] via SUBCUTANEOUS
  Administered 2021-01-08: 8 [IU] via SUBCUTANEOUS
  Administered 2021-01-08: 3 [IU] via SUBCUTANEOUS
  Administered 2021-01-08: 8 [IU] via SUBCUTANEOUS
  Administered 2021-01-09: 2 [IU] via SUBCUTANEOUS
  Administered 2021-01-09: 5 [IU] via SUBCUTANEOUS
  Administered 2021-01-10: 3 [IU] via SUBCUTANEOUS
  Administered 2021-01-10: 5 [IU] via SUBCUTANEOUS
  Administered 2021-01-11: 11 [IU] via SUBCUTANEOUS
  Administered 2021-01-12: 3 [IU] via SUBCUTANEOUS

## 2021-01-06 MED ORDER — HYDROMORPHONE HCL 1 MG/ML IJ SOLN
1.0000 mg | INTRAMUSCULAR | Status: DC | PRN
  Administered 2021-01-06: 1 mg via INTRAVENOUS
  Filled 2021-01-06: qty 1

## 2021-01-06 MED ORDER — CITALOPRAM HYDROBROMIDE 20 MG PO TABS
40.0000 mg | ORAL_TABLET | Freq: Every day | ORAL | Status: DC
  Administered 2021-01-07 – 2021-01-10 (×4): 40 mg via ORAL
  Filled 2021-01-06 (×4): qty 2

## 2021-01-06 MED ORDER — ENSURE ENLIVE PO LIQD
237.0000 mL | Freq: Two times a day (BID) | ORAL | Status: DC
  Administered 2021-01-07 – 2021-01-08 (×4): 237 mL via ORAL

## 2021-01-06 MED ORDER — MAGNESIUM SULFATE 2 GM/50ML IV SOLN
2.0000 g | Freq: Once | INTRAVENOUS | Status: AC
  Administered 2021-01-06: 2 g via INTRAVENOUS
  Filled 2021-01-06: qty 50

## 2021-01-06 MED ORDER — POTASSIUM CHLORIDE 10 MEQ/100ML IV SOLN
10.0000 meq | INTRAVENOUS | Status: AC
  Administered 2021-01-06 – 2021-01-07 (×3): 10 meq via INTRAVENOUS
  Filled 2021-01-06 (×3): qty 100

## 2021-01-06 MED ORDER — DIPHENOXYLATE-ATROPINE 2.5-0.025 MG PO TABS: 2.0000 | ORAL_TABLET | Freq: Four times a day (QID) | ORAL | Status: DC | PRN

## 2021-01-06 MED ORDER — INSULIN DETEMIR 100 UNIT/ML ~~LOC~~ SOLN
80.0000 [IU] | Freq: Every day | SUBCUTANEOUS | Status: DC
  Administered 2021-01-06: 80 [IU] via SUBCUTANEOUS
  Filled 2021-01-06 (×3): qty 0.8

## 2021-01-06 MED ORDER — INSULIN ASPART 100 UNIT/ML ~~LOC~~ SOLN
0.0000 [IU] | Freq: Every day | SUBCUTANEOUS | Status: DC
  Administered 2021-01-07: 4 [IU] via SUBCUTANEOUS
  Administered 2021-01-08: 2 [IU] via SUBCUTANEOUS
  Administered 2021-01-11: 4 [IU] via SUBCUTANEOUS

## 2021-01-06 MED ORDER — ZOLPIDEM TARTRATE 5 MG PO TABS
5.0000 mg | ORAL_TABLET | Freq: Every day | ORAL | Status: DC
  Administered 2021-01-06 – 2021-01-11 (×6): 5 mg via ORAL
  Filled 2021-01-06 (×6): qty 1

## 2021-01-06 MED ORDER — SODIUM CHLORIDE 0.9 % IV SOLN: INTRAVENOUS | Status: DC

## 2021-01-06 MED ORDER — DICYCLOMINE HCL 10 MG PO CAPS
20.0000 mg | ORAL_CAPSULE | Freq: Four times a day (QID) | ORAL | Status: DC
  Administered 2021-01-06 – 2021-01-08 (×7): 20 mg via ORAL
  Filled 2021-01-06 (×7): qty 2

## 2021-01-06 MED ORDER — PANCRELIPASE (LIP-PROT-AMYL) 12000-38000 UNITS PO CPEP
72000.0000 [IU] | ORAL_CAPSULE | Freq: Three times a day (TID) | ORAL | Status: DC
  Administered 2021-01-07 – 2021-01-12 (×18): 72000 [IU] via ORAL
  Filled 2021-01-06 (×18): qty 6

## 2021-01-06 MED ORDER — POTASSIUM CHLORIDE CRYS ER 20 MEQ PO TBCR
40.0000 meq | EXTENDED_RELEASE_TABLET | Freq: Two times a day (BID) | ORAL | Status: DC
  Administered 2021-01-06: 40 meq via ORAL
  Filled 2021-01-06 (×2): qty 2

## 2021-01-06 MED ORDER — EPOETIN ALFA-EPBX 3000 UNIT/ML IJ SOLN: 3000.0000 [IU] | Freq: Once | INTRAMUSCULAR | Status: DC

## 2021-01-06 MED ORDER — MECLIZINE HCL 12.5 MG PO TABS
25.0000 mg | ORAL_TABLET | Freq: Every day | ORAL | Status: DC | PRN
  Administered 2021-01-08: 25 mg via ORAL
  Filled 2021-01-06: qty 2

## 2021-01-06 NOTE — ED Triage Notes (Signed)
States her blood sugar is also running high

## 2021-01-06 NOTE — Progress Notes (Signed)
11:45 PM RN called due to patient's BP dropping to 85/43, BP was initially elevated admission at 215/82.  Patient was given labetalol 300 mg, she also received 1 mg of Dilaudid and was also given gabapentin and Ambien.  She was in no acute distress. IV bolus of 500 mL NS was ordered with close monitoring. BP improved to 110/47.  We shall continue to monitor patient.

## 2021-01-06 NOTE — H&P (Signed)
History and Physical  Heather [name] [name]:[id number] DOB: 1950/11/11 DOA: 01/06/2021  Referring physician: Marcene Brawn, PA-C, EDP PCP: [name], MD  Outpatient Specialists:   Patient Coming From: home  Chief Complaint: Abdominal pain, diarrhea  HPI: Heather [name] is a 70 y.o. female with a history of Crohn's disease, B12 deficiency, stage III chronic kidney disease, diabetes, hypertension, GERD, irritable bowel syndrome, hypertension.  Patient sent to the hospital from her nephrologist due to elevated blood pressures in the office.  Here, her blood pressures were initially normal but increased to severe range throughout her evaluation.  She was also noted to have increased belly pain with diarrhea, up to 13-14 times a day.  This is abnormal for her.  She does take Lomotil for her diarrhea without improvement.  The diarrhea started approximately 5 days ago with no palliating.  Her symptoms are worsening.  She is finding it difficult to eat as eating increases her diarrhea.  Her abdominal pain is located in her periumbilical and epigastric area.  No radiation.  Emergency Department Course: Potassium 3 with magnesium of 1.3.  Creatinine 2.04, which appears to be her baseline.  White count slightly elevated at 12.9.  Review of Systems:    Pt denies any fevers, chills, nausea, constipation, shortness of breath, dyspnea on exertion, orthopnea, cough, wheezing, palpitations, headache, vision changes, lightheadedness, dizziness, melena, rectal bleeding.  Review of systems are otherwise negative  Past Medical History:  Diagnosis Date  . Acute renal failure superimposed on stage 3 chronic kidney disease ([place]) 01/21/2016  . Anemia   . Anxiety   . B12 deficiency   . Brain tumor (benign) ([place]) 10/29/2020  . Cellulitis and abscess    Abdomen and buttocks  . CKD (chronic kidney disease) stage 3, GFR 30-59 ml/min (HCC)   . Closed fracture of distal end of femur, unspecified fracture  morphology, initial encounter ([place])   . Depression   . Diabetic neuropathy (HCC)    feet  . DJD (degenerative joint disease)    Right forminal stenosis C4-5  . Essential hypertension   . Femur fracture, right ([place]) 03/30/2017  . Folliculitis   . GERD (gastroesophageal reflux disease)   . Gout   . Headache(784.0)   . Hiatal hernia   . History of blood transfusion   . History of bronchitis   . History of cardiac catheterization    No significant CAD 2012  . History of kidney stones    surgery to remove  . Insomnia   . Irritable bowel syndrome   . Mixed hyperlipidemia   . Osteoarthritis   . Palpitations   . Reflux esophagitis   . Salmonella   . Tinnitus    Right  . Type 2 diabetes mellitus ([place])    Past Surgical History:  Procedure Laterality Date  . BACK SURGERY    . c-spine surgery     03/2007  . CARDIAC CATHETERIZATION    . CHOLECYSTECTOMY    . COLONOSCOPY  04/21/2011; 05/29/11   6/12: [place] - ?AVM at IC valve, inflammatory changes at [facility] valve [facility]); 7/12 - [facility]; IC valve erosions, look chronic and probably Crohn's per path  . colonoscopy  2017   [place]: random biopsies normal. TI normal  . ESOPHAGOGASTRODUODENOSCOPY  05/29/11   normal  . ESOPHAGOGASTRODUODENOSCOPY N/A 01/21/2016   Dr. Michail Sermon: normal  . EYE SURGERY Bilateral    cataracts removed  . FEMUR IM NAIL Right 04/10/2018   Procedure: INTRAMEDULLARY (IM) NAIL FEMORAL;  Surgeon:  Paralee Cancel, MD;  Location: [facility];  Service: Orthopedics;  Laterality: Right;  . GIVENS CAPSULE STUDY  11/17/2020  . HARDWARE REMOVAL Right 04/10/2018   Procedure: HARDWARE REMOVAL RIGHT DISTAL FEMUR;  Surgeon: Paralee Cancel, MD;  Location: [facility];  Service: Orthopedics;  Laterality: Right;  . IR FLUORO GUIDE CV LINE RIGHT  04/01/2017  . IR US GUIDE VASC ACCESS RIGHT  04/01/2017  . JOINT REPLACEMENT Right    hip  . OPEN REDUCTION INTERNAL FIXATION (ORIF) DISTAL RADIAL FRACTURE Right 05/28/2019   Procedure: OPEN  REDUCTION INTERNAL FIXATION (ORIF) DISTAL RADIAL FRACTURE;  Surgeon: Verner Mould, MD;  Location: [place];  Service: Orthopedics;  Laterality: Right;  with block 90 minutes  . ORIF FEMUR FRACTURE Right 03/31/2017   Procedure: OPEN REDUCTION INTERNAL FIXATION (ORIF) DISTAL FEMUR FRACTURE;  Surgeon: Paralee Cancel, MD;  Location: [place];  Service: Orthopedics;  Laterality: Right;  . removal of kidney stone    . Right knee arthroscopic surgery    . TONSILLECTOMY    . TOTAL ABDOMINAL HYSTERECTOMY     Social History:  reports that she has never smoked. She has never used smokeless tobacco. She reports that she does not drink alcohol and does not use drugs. Patient lives at home  Allergies  Allergen Reactions  . Duloxetine Hcl Swelling  . Fluocinolone Swelling  . Acetaminophen Itching and Swelling    Pt states "some swelling"  . Amlodipine Besylate Swelling    Swelling of feet, fluid retention  . Ciprofloxacin Nausea And Vomiting and Swelling    Swelling of face, jaw, and lips  . Hydrocodone-Acetaminophen Itching  . Metformin Other (See Comments)    REACTION: GI UPSET  . Nsaids Other (See Comments)    "HAS BLEEDING ULCERS"  . Quinolones Swelling  . Sulfamethoxazole Swelling    Swelling of feet, legs  . Valsartan Swelling  . Cozaar [Losartan] Swelling and Rash  . Lisinopril Swelling and Rash    Oral swelling, and red streaks on arms/stomach  . Tape Itching and Rash    Paper tape can only be used on this patient    Family History  Problem Relation Age of Onset  . Diabetes Mother   . Colon cancer Brother        POSSIBLY. Patient states diagnosed at age 40, then later states age 47. unclear and limited historian  . Esophageal cancer Neg Hx   . Rectal cancer Neg Hx   . Stomach cancer Neg Hx       Prior to Admission medications   Medication Sig Start Date End Date Taking? Authorizing Provider  acetaminophen (TYLENOL) 650 MG CR tablet Take 650 mg by mouth every 8 (eight)  hours as needed for pain.   Yes [provider]  amLODipine (NORVASC) 5 MG tablet Take 5 mg by mouth daily. 05/23/20  Yes [provider]  Ascorbic Acid (VITAMIN C WITH ROSE HIPS) 500 MG tablet Take 500 mg by mouth every morning.   Yes [provider]  atorvastatin (LIPITOR) 80 MG tablet Take 80 mg by mouth every morning.   Yes [provider]  butalbital-acetaminophen-caffeine (FIORICET) 50-325-40 MG tablet Take 1 tablet by mouth every 6 (six) hours as needed for migraine. 05/19/20 05/19/21 Yes Barton Dubois, MD  Cholecalciferol (VITAMIN D) 2000 units CAPS Take 2,000 Units by mouth every morning.    Yes [provider]  citalopram (CELEXA) 40 MG tablet Take 40 mg by mouth daily.   Yes [provider]  CREON 36000-114000 units CPEP capsule Take 2 capsules (72,000 Units total) by mouth 3 (three) times daily. 11/04/20  Yes Gatha Mayer, MD  cyanocobalamin (,VITAMIN B-12,) 1000 MCG/ML injection Inject 1,000 mcg into the muscle every 30 (thirty) days.     Yes [provider]  dicyclomine (BENTYL) 20 MG tablet Take 1 tablet (20 mg total) by mouth every 6 (six) hours. 11/04/20  Yes Gatha Mayer, MD  diphenoxylate-atropine (LOMOTIL) 2.5-0.025 MG tablet 1-2 tablets qid prn diarrhea Instead of Imodium 12/06/20  Yes Gatha Mayer, MD  epoetin alfa-epbx (RETACRIT) 3000 UNIT/ML injection Inject 3,000 Units into the skin every 14 (fourteen) days. 11/21/20  Yes Bhutani, Manpreet S, MD  esomeprazole (NEXIUM) 40 MG capsule Take 40 mg by mouth daily before breakfast.     Yes [provider]  fluconazole (DIFLUCAN) 150 MG tablet Take 150 mg by mouth once. 01/02/21  Yes [provider]  furosemide (LASIX) 40 MG tablet Take 1 tablet (40 mg total) by mouth daily. Patient taking differently: Take 40 mg by mouth 2 (two) times daily. 01/26/16  Yes Reyne Dumas, MD  gabapentin (NEURONTIN) 300 MG capsule Take 300 mg by mouth 3 (three) times daily.  10/20/20  Yes [provider]  hydrocortisone 2.5 % cream Apply topically 2 (two) times daily. 10/19/20  Yes Gatha Mayer, MD  indomethacin (INDOCIN) 25 MG capsule Take 1 capsule by mouth as needed.   Yes [provider]  insulin detemir (LEVEMIR FLEXTOUCH) 100 UNIT/ML FlexPen Inject 20 Units into the skin at bedtime. Patient adjust for her sugars Patient taking differently: Inject 80 Units into the skin at bedtime. Patient adjust for her sugars 05/19/20  Yes Barton Dubois, MD  Insulin Lispro (HUMALOG KWIKPEN [place]) Inject 20 Units into the skin 3 (three) times daily with meals. Sliding scale   Yes [provider]  labetalol (NORMODYNE) 300 MG tablet Take 1 tablet (300 mg total) by mouth 2 (two) times daily. 08/22/20  Yes BranchAlphonse Guild, MD  meclizine (ANTIVERT) 25 MG tablet Take 1 tablet by mouth as needed.   Yes [provider]  nystatin (MYCOSTATIN) 100000 UNIT/ML suspension Take 20 mLs by mouth as needed. 10/05/20  Yes [provider]  omega-3 acid ethyl esters (LOVAZA) 1 g capsule Take 2 capsules by mouth 2 (two) times daily. 10/11/20  Yes [provider]  ondansetron (ZOFRAN) 4 MG tablet Take 4 mg by mouth every 8 (eight) hours as needed for nausea. 08/24/20  Yes [provider]  promethazine (PHENERGAN) 25 MG tablet Take 25 mg by mouth as needed. 07/27/20  Yes [provider]  Semaglutide,0.25 or 0.5MG /DOS, (OZEMPIC, 0.25 OR 0.5 MG/DOSE,) 2 MG/1.5ML SOPN Inject 2 mg into the skin every Sunday.    Yes [provider]  tiZANidine (ZANAFLEX) 4 MG tablet Take 8 mg by mouth at bedtime. 09/06/20  Yes [provider]  venlafaxine XR (EFFEXOR-XR) 75 MG 24 hr capsule Take 75 mg by mouth daily. 10/26/20  Yes [provider]  zolpidem (AMBIEN) 10 MG tablet Take 10 mg by mouth at bedtime. 05/15/20  Yes [provider]  Adalimumab (HUMIRA PEN) 40 MG/0.4ML PNKT Inject 40 mg into the skin every 14  (fourteen) days. After the completion of the starter kit Patient not taking: No sig reported 12/01/20   Gatha Mayer, MD  Adalimumab [facility] PEN-CD/UC/HS STARTER) 80 MG/0.8ML PNKT Inject 160 mg into the skin once for 1 dose. On day 0, then inject  80 mg on day 14 12/01/20 12/01/20  Gatha Mayer, MD  allopurinol (ZYLOPRIM) 100 MG tablet Take 100 mg by mouth daily. Patient not taking: Reported on 01/06/2021    [provider]  aspirin EC 81 MG tablet Take 81 mg by mouth every morning.  Patient not taking: Reported on 01/06/2021    [provider]  calcium carbonate (OS-CAL) 1250 (500 Ca) MG chewable tablet Chew 2 tablets by mouth daily. Patient not taking: Reported on 01/06/2021 10/13/20 10/13/21  [provider]  fenofibrate (TRICOR) 145 MG tablet Take 1 tablet (145 mg total) by mouth daily. Patient not taking: Reported on 01/06/2021 09/07/20   Arnoldo Lenis, MD  hydrALAZINE (APRESOLINE) 100 MG tablet Take 1 tablet (100 mg total) by mouth every 8 (eight) hours. Patient not taking: No sig reported 05/19/20   Barton Dubois, MD  methocarbamol (ROBAXIN) 500 MG tablet Take 1 tablet (500 mg total) by mouth every 6 (six) hours as needed for muscle spasms. Patient not taking: Reported on 01/06/2021 05/19/20   Barton Dubois, MD  ondansetron (ZOFRAN ODT) 4 MG disintegrating tablet Take 1 tablet (4 mg total) by mouth every 8 (eight) hours as needed. Patient not taking: No sig reported 11/04/20   Fredia Sorrow, MD  potassium chloride SA (K-DUR,KLOR-CON) 20 MEQ tablet Take 20 mEq by mouth every morning.  Patient not taking: Reported on 01/06/2021    [provider]    Physical Exam: BP (!) 218/85   Pulse 80   Temp 98.1 F (36.7 C) (Oral)   Resp 18   Ht _0  (1.6 m)   Wt 60 kg   LMP  (LMP Unknown)   SpO2 100%   BMI 23.43 kg/m   . General: Elderly female. Awake and alert and oriented x3. No acute cardiopulmonary distress.  Marland Kitchen HEENT: Normocephalic atraumatic.   Right and left ears normal in appearance.  Pupils equal, round, reactive to light. Extraocular muscles are intact. Sclerae anicteric and noninjected.  Moist mucosal membranes. No mucosal lesions.  . Neck: Neck supple without lymphadenopathy. No carotid bruits. No masses palpated.  . Cardiovascular: Regular rate with normal S1-S2 sounds. No murmurs, rubs, gallops auscultated. No JVD.  Marland Kitchen Respiratory: Good respiratory effort with no wheezes, rales, rhonchi. Lungs clear to auscultation bilaterally.  No accessory muscle use. . Abdomen: Soft, tender in the periumbilical area. Nondistended. Active bowel sounds. No masses or hepatosplenomegaly  . Skin: No rashes, lesions, or ulcerations.  Dry, warm to touch. 2+ dorsalis pedis and radial pulses. . Musculoskeletal: No calf or leg pain. All major joints not erythematous nontender.  No upper or lower joint deformation.  Good ROM.  No contractures  . Psychiatric: Intact judgment and insight. Pleasant and cooperative. . Neurologic: No focal neurological deficits. Strength is 5/5 and symmetric in upper and lower extremities.  Cranial nerves II through XII are grossly intact.           Labs on Admission: I have personally reviewed following labs and imaging studies  CBC: Recent Labs  Lab 01/06/21 1403  WBC 12.9*  NEUTROABS 8.7*  HGB 10.2*  HCT 30.3*  MCV 89.6  PLT 194   Basic Metabolic Panel: Recent Labs  Lab 01/06/21 1403  NA 134*  K 2.3*  CL 94*  CO2 24  GLUCOSE 100*  BUN 28*  CREATININE 2.04*  CALCIUM 9.3  MG 1.3*   GFR: Estimated Creatinine Clearance: 21.8 mL/min (A) (by C-G formula based on SCr of 2.04 mg/dL (H)). Liver  Function Tests: Recent Labs  Lab 01/06/21 1403  AST 18  ALT 15  ALKPHOS 91  BILITOT 0.2*  PROT 7.7  ALBUMIN 3.7   No results for input(s): LIPASE, AMYLASE in the last 168 hours. No results for input(s): AMMONIA in the last 168 hours. Coagulation Profile: No results for input(s): INR, PROTIME in the last  168 hours. Cardiac Enzymes: No results for input(s): CKTOTAL, CKMB, CKMBINDEX, TROPONINI in the last 168 hours. BNP (last 3 results) No results for input(s): PROBNP in the last 8760 hours. HbA1C: No results for input(s): HGBA1C in the last 72 hours. CBG: Recent Labs  Lab 01/06/21 1304 01/06/21 1323  GLUCAP 66* 70   Lipid Profile: No results for input(s): CHOL, HDL, LDLCALC, TRIG, CHOLHDL, LDLDIRECT in the last 72 hours. Thyroid Function Tests: No results for input(s): TSH, T4TOTAL, FREET4, T3FREE, THYROIDAB in the last 72 hours. Anemia Panel: No results for input(s): VITAMINB12, FOLATE, FERRITIN, TIBC, IRON, RETICCTPCT in the last 72 hours. Urine analysis:    Component Value Date/Time   COLORURINE YELLOW 03/02/2020 1420   APPEARANCEUR CLEAR 03/02/2020 1420   LABSPEC 1.012 03/02/2020 1420   PHURINE 5.0 03/02/2020 1420   GLUCOSEU NEGATIVE 03/02/2020 1420   GLUCOSEU NEGATIVE 03/28/2018 1224   HGBUR NEGATIVE 03/02/2020 1420   BILIRUBINUR NEGATIVE 03/02/2020 1420   KETONESUR NEGATIVE 03/02/2020 1420   PROTEINUR 100 (A) 03/02/2020 1420   UROBILINOGEN 1.0 03/28/2018 1224   NITRITE NEGATIVE 03/02/2020 1420   LEUKOCYTESUR TRACE (A) 03/02/2020 1420   Sepsis Labs: _0 (procalcitonin:4,lacticidven:4) ) Recent Results (from the past 240 hour(s))  Resp Panel by RT-PCR (Flu A&B, Covid) Nasopharyngeal Swab     Status: None   Collection Time: 01/06/21  4:02 PM   Specimen: Nasopharyngeal Swab; Nasopharyngeal(NP) swabs in vial transport medium  Result Value Ref Range Status   SARS Coronavirus 2 by RT PCR NEGATIVE NEGATIVE Final    Comment: (NOTE) SARS-CoV-2 target nucleic acids are NOT DETECTED.  The SARS-CoV-2 RNA is generally detectable in upper respiratory specimens during the acute phase of infection. The lowest concentration of SARS-CoV-2 viral copies this assay can detect is 138 copies/mL. A negative result does not preclude SARS-Cov-2 infection and should not be used as  the sole basis for treatment or other patient management decisions. A negative result may occur with  improper specimen collection/handling, submission of specimen other than nasopharyngeal swab, presence of viral mutation(s) within the areas targeted by this assay, and inadequate number of viral copies(<138 copies/mL). A negative result must be combined with clinical observations, patient history, and epidemiological information. The expected result is Negative.  Fact Sheet for Patients:  EntrepreneurPulse.com.au  Fact Sheet for Healthcare Providers:  IncredibleEmployment.be  This test is no t yet approved or cleared by the [place] FDA and  has been authorized for detection and/or diagnosis of SARS-CoV-2 by FDA under an Emergency Use Authorization (EUA). This EUA will remain  in effect (meaning this test can be used) for the duration of the COVID-19 declaration under Section 564(b)(1) of the Act, 21 U.S.C.section 360bbb-3(b)(1), unless the authorization is terminated  or revoked sooner.       Influenza A by PCR NEGATIVE NEGATIVE Final   Influenza B by PCR NEGATIVE NEGATIVE Final    Comment: (NOTE) The Xpert Xpress SARS-CoV-2/FLU/RSV plus assay is intended as an aid in the diagnosis of influenza from Nasopharyngeal swab specimens and should not be used as a sole basis for treatment. Nasal washings and aspirates are unacceptable for Xpert Xpress SARS-CoV-2/FLU/RSV testing.  Fact Sheet for Patients: EntrepreneurPulse.com.au  Fact Sheet for Healthcare Providers: IncredibleEmployment.be  This test is not yet approved or cleared by the [place] FDA and has been authorized for detection and/or diagnosis of SARS-CoV-2 by FDA under an Emergency Use Authorization (EUA). This EUA will remain in effect (meaning this test can be used) for the duration of the COVID-19 declaration under Section 564(b)(1) of  the Act, 21 U.S.C. section 360bbb-3(b)(1), unless the authorization is terminated or revoked.  Performed at [facility], [address] [place],  [address]      Radiological Exams on Admission: No results found.   Assessment/Plan: Active Problems:   Irritable bowel syndrome   Chronic kidney disease, stage III (moderate) (HCC)   Essential hypertension   GERD (gastroesophageal reflux disease)   Crohn's disease of small intestine with other complication ([place])   Type 2 diabetes mellitus without complications ([place])   Acute hypokalemia    This patient was discussed with the ED physician, including pertinent vitals, physical exam findings, labs, and imaging.  We also discussed care given by the ED provider.  1. Acute hypokalemia a. Admit b. Replace potassium with both oral and IV potassium c. Recheck potassium in the morning 2. Acute hypomagnesemia a. Replace magnesium b. Recheck in the morning 3. Abdominal pain with diarrhea with Crohn's interval bowel syndrome a. GI consulted b. Stool studies with inflammatory markers c. If elevated, start IV steroids.  If no improvement of pain, may need CT to evaluate for ileitis 4. GERD a.  5. Hypertension a. IV hydralazine b. Continue antihypertensives 6. Type 2 diabetes a. Insulin b. Sliding scale insulin with CBGs before meals and nightly  DVT prophylaxis: Lovenox Consultants: GI Code Status: DNR Family Communication: none  Disposition Plan: Patient should be able to return home following admission   Truett Mainland, DO

## 2021-01-06 NOTE — ED Notes (Signed)
Date and time results received: 01/06/21 3:11 PM  Test: potassum Critical Value: 2.3  Name of Provider Notified: sophia, PA  Orders Received? Or Actions Taken?: notified

## 2021-01-06 NOTE — ED Provider Notes (Addendum)
[facility] EMERGENCY DEPARTMENT Provider Note   CSN: [id number] Arrival date & time: 01/06/21  1159     History Chief Complaint  Patient presents with  . Hypertension    Heather [name] is a 70 y.o. female.  The history is provided by the patient. No language interpreter was used.  Hypertension This is a new problem. The current episode started more than 2 days ago. The problem occurs constantly. The problem has been gradually worsening. Nothing aggravates the symptoms. Nothing relieves the symptoms. She has tried nothing for the symptoms. The treatment provided no relief.   Pt reports her glucose meter has been reporting her readings are high.  Pt reports she feels bad.  Pt reports not eating or drinking Pt saw her nephrologist today and blood pressure was high. Pt concerned she is dehydrated.  Pt complains of nausea and a headache. Pt reports every time she eats she vomits or has diarrhea.  Pt has a history of chrons disease.  Pt reports this is worse than regular flare ups of chrons.  Pt reports 12 pounds of weight loss over 2 weeks     Past Medical History:  Diagnosis Date  . Acute renal failure superimposed on stage 3 chronic kidney disease ([place]) 01/21/2016  . Anemia   . Anxiety   . B12 deficiency   . Brain tumor (benign) ([place]) 10/29/2020  . Cellulitis and abscess    Abdomen and buttocks  . CKD (chronic kidney disease) stage 3, GFR 30-59 ml/min (HCC)   . Closed fracture of distal end of femur, unspecified fracture morphology, initial encounter ([place])   . Depression   . Diabetic neuropathy (HCC)    feet  . DJD (degenerative joint disease)    Right forminal stenosis C4-5  . Essential hypertension   . Femur fracture, right ([place]) 03/30/2017  . Folliculitis   . GERD (gastroesophageal reflux disease)   . Gout   . Headache(784.0)   . Hiatal hernia   . History of blood transfusion   . History of bronchitis   . History of cardiac catheterization    No significant CAD  2012  . History of kidney stones    surgery to remove  . Insomnia   . Irritable bowel syndrome   . Mixed hyperlipidemia   . Osteoarthritis   . Palpitations   . Reflux esophagitis   . Salmonella   . Tinnitus    Right  . Type 2 diabetes mellitus [facility])     Patient Active Problem List   Diagnosis Date Noted  . Long-term use of immunosuppressant medication-anticipated 11/28/2020  . Headache 05/17/2020  . Hypokalemia 05/17/2020  . Hypomagnesemia 05/17/2020  . Dehydration 05/17/2020  . Hypertensive urgency 05/17/2020  . Diplopia 05/17/2020  . Left hip pain 09/22/2019  . Lumbar radiculopathy 09/22/2019  . Postoperative anemia due to acute blood loss 09/18/2019  . Postoperative urinary retention 09/18/2019  . Closed fracture of distal end of radius 05/26/2019  . Closed right hip fracture ([place]) 04/08/2018  . Elevated lipase   . Crohn's disease of small intestine with other complication ([place])   . Protein-calorie malnutrition, severe 03/05/2018  . Neck pain 03/12/2017  . Acute kidney injury superimposed on CKD ([place]) 01/25/2016  . Neuropathy 03/02/2015  . Pain in the chest   . Chest pain 12/22/2014  . Essential hypertension 12/22/2014  . Type 2 diabetes with nephropathy ([place]) 12/22/2014  . Hiatal hernia 12/22/2014  . GERD (gastroesophageal reflux disease) 12/22/2014  . Gout 12/22/2014  .  Mixed hyperlipidemia 12/22/2014  . Depression 12/22/2014  . Type 2 diabetes mellitus without complications ([place]) 92/44/6286  . Fusion of spine, cervicothoracic region 11/01/2014  . Pseudophakia, left eye 10/20/2014  . Chronic kidney disease, stage III (moderate) ([place]) 08/25/2014  . Cervical stenosis of spine 08/26/2013  . Vitamin B12 deficiency 07/08/2011  . Personal history of failed moderate sedation 04/16/2011  . Chronic Nausea and Vomiting - ? gastroapresis 04/16/2011  . Early satiety 04/16/2011  . Irritable bowel syndrome 04/16/2011  . Benign paroxysmal positional vertigo 11/20/2010  .  TRICUSPID REGURGITATION, MODERATE 11/20/2010  . PULMONARY HYPERTENSION, MILD 11/20/2010  . LEG EDEMA, BILATERAL 11/20/2010  . DYSPNEA 11/20/2010  . NONSPECIFIC ABNORMAL ELECTROCARDIOGRAM 11/20/2010  . CHEST WALL PAIN, HX OF 11/20/2010    Past Surgical History:  Procedure Laterality Date  . BACK SURGERY    . c-spine surgery     03/2007  . CARDIAC CATHETERIZATION    . CHOLECYSTECTOMY    . COLONOSCOPY  04/21/2011; 05/29/11   6/12: [place] - ?AVM at IC valve, inflammatory changes at [facility] valve [facility]); 7/12 - [place]; IC valve erosions, look chronic and probably Crohn's per path  . colonoscopy  2017   [place]: random biopsies normal. TI normal  . ESOPHAGOGASTRODUODENOSCOPY  05/29/11   normal  . ESOPHAGOGASTRODUODENOSCOPY N/A 01/21/2016   Dr. Michail Sermon: normal  . EYE SURGERY Bilateral    cataracts removed  . FEMUR IM NAIL Right 04/10/2018   Procedure: INTRAMEDULLARY (IM) NAIL FEMORAL;  Surgeon: Paralee Cancel, MD;  Location: [facility];  Service: Orthopedics;  Laterality: Right;  . GIVENS CAPSULE STUDY  11/17/2020  . HARDWARE REMOVAL Right 04/10/2018   Procedure: HARDWARE REMOVAL RIGHT DISTAL FEMUR;  Surgeon: Paralee Cancel, MD;  Location: [facility];  Service: Orthopedics;  Laterality: Right;  . IR FLUORO GUIDE CV LINE RIGHT  04/01/2017  . IR US GUIDE VASC ACCESS RIGHT  04/01/2017  . JOINT REPLACEMENT Right    hip  . OPEN REDUCTION INTERNAL FIXATION (ORIF) DISTAL RADIAL FRACTURE Right 05/28/2019   Procedure: OPEN REDUCTION INTERNAL FIXATION (ORIF) DISTAL RADIAL FRACTURE;  Surgeon: Verner Mould, MD;  Location: [place];  Service: Orthopedics;  Laterality: Right;  with block 90 minutes  . ORIF FEMUR FRACTURE Right 03/31/2017   Procedure: OPEN REDUCTION INTERNAL FIXATION (ORIF) DISTAL FEMUR FRACTURE;  Surgeon: Paralee Cancel, MD;  Location: [place];  Service: Orthopedics;  Laterality: Right;  . removal of kidney stone    . Right knee arthroscopic surgery    . TONSILLECTOMY    . TOTAL  ABDOMINAL HYSTERECTOMY       OB History   No obstetric history on file.     Family History  Problem Relation Age of Onset  . Diabetes Mother   . Colon cancer Brother        POSSIBLY. Patient states diagnosed at age 40, then later states age 65. unclear and limited historian  . Esophageal cancer Neg Hx   . Rectal cancer Neg Hx   . Stomach cancer Neg Hx     Social History   Tobacco Use  . Smoking status: Never Smoker  . Smokeless tobacco: Never Used  Vaping Use  . Vaping Use: Never used  Substance Use Topics  . Alcohol use: No  . Drug use: No    Home Medications Prior to Admission medications   Medication Sig Start Date End Date Taking? Authorizing Provider  acetaminophen (TYLENOL) 650 MG CR tablet Take 650 mg by mouth every 8 (eight) hours  as needed for pain.    [provider]  Adalimumab (HUMIRA PEN) 40 MG/0.4ML PNKT Inject 40 mg into the skin every 14 (fourteen) days. After the completion of the starter kit 12/01/20   Gatha Mayer, MD  Adalimumab [facility] PEN-CD/UC/HS STARTER) 80 MG/0.8ML PNKT Inject 160 mg into the skin once for 1 dose. On day 0, then inject 80 mg on day 14 12/01/20 12/01/20  Gatha Mayer, MD  allopurinol (ZYLOPRIM) 100 MG tablet Take 100 mg by mouth daily.    [provider]  amLODipine (NORVASC) 5 MG tablet Take 5 mg by mouth daily. 05/23/20   [provider]  Ascorbic Acid (VITAMIN C WITH ROSE HIPS) 500 MG tablet Take 500 mg by mouth every morning.    [provider]  aspirin EC 81 MG tablet Take 81 mg by mouth every morning.     [provider]  atorvastatin (LIPITOR) 80 MG tablet Take 80 mg by mouth every morning.    [provider]  butalbital-acetaminophen-caffeine (FIORICET) 50-325-40 MG tablet Take 1 tablet by mouth every 6 (six) hours as needed for migraine. 05/19/20 05/19/21  Barton Dubois, MD  calcium carbonate (OS-CAL) 1250 (500 Ca) MG chewable tablet Chew 2 tablets by mouth daily. 10/13/20  10/13/21  [provider]  Cholecalciferol (VITAMIN D) 2000 units CAPS Take 2,000 Units by mouth every morning.     [provider]  citalopram (CELEXA) 40 MG tablet Take 40 mg by mouth daily.    [provider]  CREON 36000-114000 units CPEP capsule Take 2 capsules (72,000 Units total) by mouth 3 (three) times daily. 11/04/20   Gatha Mayer, MD  cyanocobalamin (,VITAMIN B-12,) 1000 MCG/ML injection Inject 1,000 mcg into the muscle every 30 (thirty) days.      [provider]  dicyclomine (BENTYL) 20 MG tablet Take 1 tablet (20 mg total) by mouth every 6 (six) hours. 11/04/20   Gatha Mayer, MD  diphenoxylate-atropine (LOMOTIL) 2.5-0.025 MG tablet 1-2 tablets qid prn diarrhea Instead of Imodium 12/06/20   Gatha Mayer, MD  epoetin alfa-epbx (RETACRIT) 3000 UNIT/ML injection Inject 3,000 Units into the skin every 14 (fourteen) days. 11/21/20   Bhutani, Lavina Hamman, MD  esomeprazole (NEXIUM) 40 MG capsule Take 40 mg by mouth daily before breakfast.      [provider]  fenofibrate (TRICOR) 145 MG tablet Take 1 tablet (145 mg total) by mouth daily. 09/07/20   Arnoldo Lenis, MD  furosemide (LASIX) 40 MG tablet Take 1 tablet (40 mg total) by mouth daily. Patient taking differently: Take 40 mg by mouth 2 (two) times daily. 01/26/16   Reyne Dumas, MD  gabapentin (NEURONTIN) 300 MG capsule Take 300 mg by mouth 3 (three) times daily. 10/20/20   [provider]  hydrALAZINE (APRESOLINE) 100 MG tablet Take 1 tablet (100 mg total) by mouth every 8 (eight) hours. Patient taking differently: Take 100 mg by mouth 3 (three) times daily. 05/19/20   Barton Dubois, MD  hydrocortisone 2.5 % cream Apply topically 2 (two) times daily. 10/19/20   Gatha Mayer, MD  indomethacin (INDOCIN) 25 MG capsule Take 1 capsule by mouth as needed.    [provider]  insulin detemir (LEVEMIR FLEXTOUCH) 100 UNIT/ML FlexPen Inject 20 Units into the skin at  bedtime. Patient adjust for her sugars 05/19/20   Barton Dubois, MD  Insulin Lispro (HUMALOG KWIKPEN [place]) Inject 20 Units into the skin 3 (three) times daily with meals. Sliding  scale    [provider]  labetalol (NORMODYNE) 300 MG tablet Take 1 tablet (300 mg total) by mouth 2 (two) times daily. 08/22/20   Arnoldo Lenis, MD  meclizine (ANTIVERT) 25 MG tablet Take 1 tablet by mouth as needed.    [provider]  methocarbamol (ROBAXIN) 500 MG tablet Take 1 tablet (500 mg total) by mouth every 6 (six) hours as needed for muscle spasms. 05/19/20   Barton Dubois, MD  metoprolol tartrate (LOPRESSOR) 100 MG tablet Take 100 mg by mouth 2 (two) times daily. 09/23/20   [provider]  nystatin (MYCOSTATIN) 100000 UNIT/ML suspension Take 20 mLs by mouth as needed. 10/05/20   [provider]  omega-3 acid ethyl esters (LOVAZA) 1 g capsule Take 2 capsules by mouth 2 (two) times daily. 10/11/20   [provider]  ondansetron (ZOFRAN ODT) 4 MG disintegrating tablet Take 1 tablet (4 mg total) by mouth every 8 (eight) hours as needed. 11/04/20   Fredia Sorrow, MD  ondansetron (ZOFRAN) 4 MG tablet Take 4 mg by mouth every 8 (eight) hours as needed for nausea. 08/24/20   [provider]  potassium chloride SA (K-DUR,KLOR-CON) 20 MEQ tablet Take 20 mEq by mouth every morning.     [provider]  promethazine (PHENERGAN) 25 MG tablet Take 25 mg by mouth as needed. 07/27/20   [provider]  Semaglutide,0.25 or 0.5MG/DOS, (OZEMPIC, 0.25 OR 0.5 MG/DOSE,) 2 MG/1.5ML SOPN Inject 2 mg into the skin every Sunday.     [provider]  tiZANidine (ZANAFLEX) 4 MG tablet Take 8 mg by mouth at bedtime. 09/06/20   [provider]  venlafaxine XR (EFFEXOR-XR) 75 MG 24 hr capsule Take 75 mg by mouth daily. 10/26/20   [provider]  zolpidem (AMBIEN) 10 MG tablet Take 10 mg by mouth at bedtime. 05/15/20   [provider]     Allergies    Duloxetine hcl, Fluocinolone, Acetaminophen, Amlodipine besylate, Ciprofloxacin, Hydrocodone-acetaminophen, Metformin, Nsaids, Quinolones, Sulfamethoxazole, Valsartan, Cozaar [losartan], Lisinopril, and Tape  Review of Systems   Review of Systems  Gastrointestinal: Positive for diarrhea, nausea and vomiting.  All other systems reviewed and are negative.   Physical Exam Updated Vital Signs BP 126/67 (BP Location: Left Arm)   Pulse 82   Temp 98.1 F (36.7 C) (Oral)   Resp 14   Ht _0  (1.6 m)   Wt 60 kg   LMP  (LMP Unknown)   SpO2 100%   BMI 23.43 kg/m   Physical Exam Vitals and nursing note reviewed.  Constitutional:      Appearance: She is well-developed.  HENT:     Head: Normocephalic.     Mouth/Throat:     Mouth: Mucous membranes are moist.  Eyes:     Extraocular Movements: Extraocular movements intact.     Pupils: Pupils are equal, round, and reactive to light.  Cardiovascular:     Rate and Rhythm: Normal rate and regular rhythm.     Pulses: Normal pulses.  Pulmonary:     Effort: Pulmonary effort is normal.  Abdominal:     General: Abdomen is flat. There is no distension.  Musculoskeletal:        General: Normal range of motion.     Cervical back: Normal range of motion.  Skin:    General: Skin is warm.  Neurological:     General: No focal deficit present.     Mental Status: She is alert and oriented to  person, place, and time.  Psychiatric:        Mood and Affect: Mood normal.     ED Results / Procedures / Treatments   Labs (all labs ordered are listed, but only abnormal results are displayed) Labs Reviewed  CBC WITH DIFFERENTIAL/PLATELET - Abnormal; Notable for the following components:      Result Value   WBC 12.9 (*)    RBC 3.38 (*)    Hemoglobin 10.2 (*)    HCT 30.3 (*)    Neutro Abs 8.7 (*)    Abs Immature Granulocytes 0.31 (*)    All other components within normal limits  COMPREHENSIVE METABOLIC PANEL - Abnormal; Notable  for the following components:   Sodium 134 (*)    Potassium 2.3 (*)    Chloride 94 (*)    Glucose, Bld 100 (*)    BUN 28 (*)    Creatinine, Ser 2.04 (*)    Total Bilirubin 0.2 (*)    GFR, Estimated 26 (*)    Anion gap 16 (*)    All other components within normal limits  CBG MONITORING, ED - Abnormal; Notable for the following components:   Glucose-Capillary 66 (*)    All other components within normal limits  CBG MONITORING, ED    EKG None  Radiology No results found.  Procedures Procedures   Medications Ordered in ED Medications  potassium chloride 10 mEq in 100 mL IVPB (has no administration in time range)  sodium chloride 0.9 % bolus 1,000 mL (1,000 mLs Intravenous New Bag/Given 01/06/21 1401)  ondansetron (ZOFRAN) injection 4 mg (4 mg Intravenous Given 01/06/21 1401)    ED Course  I have reviewed the triage vital signs and the nursing notes.  Pertinent labs & imaging results that were available during my care of the patient were reviewed by me and considered in my medical decision making (see chart for details).    MDM Rules/Calculators/A&P                          MDM: pt potassium is 2.3.  Pt given Iv fluids x 1 liter, covid and magnesium pending  Hospitalist consulted for admission Final Clinical Impression(s) / ED Diagnoses Final diagnoses:  Hypokalemia  Nausea and vomiting, intractability of vomiting not specified, unspecified vomiting type  Diarrhea, unspecified type    Rx / DC Orders ED Discharge Orders    None      I spoke to Dr. [name] who advised check inflammatory marker and Gi pathogens.  If negative infection and elevated markers consider IV steroids.  [name], PA-C 01/06/21 1715    [name], PA-C 01/06/21 1830    Fredia Sorrow, MD 01/17/21 [phone]

## 2021-01-07 ENCOUNTER — Inpatient Hospital Stay (HOSPITAL_COMMUNITY): Payer: 59

## 2021-01-07 DIAGNOSIS — G8929 Other chronic pain: Secondary | ICD-10-CM

## 2021-01-07 DIAGNOSIS — K909 Intestinal malabsorption, unspecified: Secondary | ICD-10-CM

## 2021-01-07 DIAGNOSIS — N1832 Chronic kidney disease, stage 3b: Secondary | ICD-10-CM

## 2021-01-07 DIAGNOSIS — K50018 Crohn's disease of small intestine with other complication: Secondary | ICD-10-CM | POA: Diagnosis not present

## 2021-01-07 DIAGNOSIS — E876 Hypokalemia: Secondary | ICD-10-CM | POA: Diagnosis not present

## 2021-01-07 DIAGNOSIS — N179 Acute kidney failure, unspecified: Secondary | ICD-10-CM | POA: Diagnosis not present

## 2021-01-07 DIAGNOSIS — R197 Diarrhea, unspecified: Secondary | ICD-10-CM

## 2021-01-07 DIAGNOSIS — N184 Chronic kidney disease, stage 4 (severe): Secondary | ICD-10-CM

## 2021-01-07 DIAGNOSIS — R1013 Epigastric pain: Secondary | ICD-10-CM

## 2021-01-07 LAB — CBC
HCT: 26.2 % — ABNORMAL LOW (ref 36.0–46.0)
Hemoglobin: 8.4 g/dL — ABNORMAL LOW (ref 12.0–15.0)
MCH: 29.9 pg (ref 26.0–34.0)
MCHC: 32.1 g/dL (ref 30.0–36.0)
MCV: 93.2 fL (ref 80.0–100.0)
Platelets: 199 10*3/uL (ref 150–400)
RBC: 2.81 MIL/uL — ABNORMAL LOW (ref 3.87–5.11)
RDW: 13.9 % (ref 11.5–15.5)
WBC: 9.6 10*3/uL (ref 4.0–10.5)
nRBC: 0 % (ref 0.0–0.2)

## 2021-01-07 LAB — BASIC METABOLIC PANEL
Anion gap: 11 (ref 5–15)
BUN: 23 mg/dL (ref 8–23)
CO2: 19 mmol/L — ABNORMAL LOW (ref 22–32)
Calcium: 8.1 mg/dL — ABNORMAL LOW (ref 8.9–10.3)
Chloride: 106 mmol/L (ref 98–111)
Creatinine, Ser: 1.8 mg/dL — ABNORMAL HIGH (ref 0.44–1.00)
GFR, Estimated: 30 mL/min — ABNORMAL LOW (ref >60)
Glucose, Bld: 289 mg/dL — ABNORMAL HIGH (ref 70–99)
Potassium: 3.8 mmol/L (ref 3.5–5.1)
Sodium: 136 mmol/L (ref 135–145)

## 2021-01-07 LAB — HEMOGLOBIN A1C
Hgb A1c MFr Bld: 11 % — ABNORMAL HIGH (ref 4.8–5.6)
Mean Plasma Glucose: 269 mg/dL

## 2021-01-07 LAB — MAGNESIUM: Magnesium: 1.4 mg/dL — ABNORMAL LOW (ref 1.7–2.4)

## 2021-01-07 LAB — LIPASE, BLOOD: Lipase: 164 U/L — ABNORMAL HIGH (ref 11–51)

## 2021-01-07 MED ORDER — ACETAMINOPHEN 325 MG PO TABS
650.0000 mg | ORAL_TABLET | Freq: Four times a day (QID) | ORAL | Status: DC
  Administered 2021-01-07 – 2021-01-11 (×14): 650 mg via ORAL
  Filled 2021-01-07 (×15): qty 2

## 2021-01-07 MED ORDER — POTASSIUM CHLORIDE IN NACL 20-0.9 MEQ/L-% IV SOLN: INTRAVENOUS | Status: AC

## 2021-01-07 MED ORDER — LIDOCAINE 5 % EX PTCH
1.0000 | MEDICATED_PATCH | CUTANEOUS | Status: DC
  Administered 2021-01-07 – 2021-01-12 (×6): 1 via TRANSDERMAL
  Filled 2021-01-07 (×6): qty 1

## 2021-01-07 MED ORDER — LABETALOL HCL 200 MG PO TABS
100.0000 mg | ORAL_TABLET | Freq: Two times a day (BID) | ORAL | Status: DC
  Administered 2021-01-07 – 2021-01-12 (×10): 100 mg via ORAL
  Filled 2021-01-07 (×11): qty 1

## 2021-01-07 MED ORDER — INSULIN DETEMIR 100 UNIT/ML ~~LOC~~ SOLN
25.0000 [IU] | Freq: Every day | SUBCUTANEOUS | Status: DC
  Administered 2021-01-07 – 2021-01-08 (×2): 25 [IU] via SUBCUTANEOUS
  Filled 2021-01-07 (×3): qty 0.25

## 2021-01-07 MED ORDER — IOHEXOL 9 MG/ML PO SOLN
ORAL | Status: AC
  Filled 2021-01-07: qty 1000

## 2021-01-07 MED ORDER — OXYCODONE HCL 5 MG PO TABS
5.0000 mg | ORAL_TABLET | ORAL | Status: DC | PRN
  Administered 2021-01-07 – 2021-01-08 (×6): 5 mg via ORAL
  Filled 2021-01-07 (×6): qty 1

## 2021-01-07 NOTE — Consult Note (Signed)
Maylon Peppers, M.D. Gastroenterology & Hepatology                                           Patient Name: Heather [name] Account #: '@[id number]' @   MRN: [id number] Admission Date: 01/06/2021 Date of Evaluation:  01/07/2021 Time of Evaluation: 10:21 AM  Referring Physician: Orson Eva, MD  Chief Complaint: Diarrhea and abdominal pain  HPI:  This is a 70 y.o. female with history of small bowel Crohn's disease, CKD, depression, diabetes, GERD, anxiety, IBS, hyperlipidemia, stroke and salmonellosis, who was admitted to the hospital after being found to have severe hypotension and hypoglycemia at her nephrologist office.  Gastroenterology was consulted given worsening abdominal pain and diarrhea.  The patient states that she has had chronic abdominal pain for multiple years, at least 5 years in the epigastric area which recently worsened for the last month.  She describes the pain as severe and is not related to any type of food.  Also reports having chronic diarrhea between 4-5 loose bowel movements per day no matter what she eats.  This actually worsened for the last week as she was having more watery bowel movement but no melena or hematochezia.  Did not have any improvement of her diarrhea while taking Lomotil.  Has presented also worsening nausea and some episodes of vomiting for the last week. Has lost 12 lb since her symptoms worsened last week.   Notably, the patient follows with Dr. Carlean Purl at [facility] who has evaluated her case thoroughly with previous EGD and colonoscopy which were unremarkable for any alteration, also had cross-sectional abdominal imaging that was unremarkable for any alteration.  The patient ultimately underwent a capsule endoscopy early 2022 which showed multiple erosions in her small bowel that were consistent with Crohn's disease.  Tissue diagnosis have not been obtained.  Based on medical record, the patient has tried prednisone and budesonide in the past with poor  tolerance as the patient presented severely uncontrolled hyperglycemia which limited the use of these medications, especially prednisone.  Most recently, the patient was started on Humira  every 2 weeks (Has received 2 doses of this medication so far).  It was suggested that she should be on 6-MP but this medication is not part of her med list.  In the ED, she was hemodynamically stable and afebrile.  Remarkable labs upon admission showed a potassium of 2.3, sodium 134, chloride 94, creatinine 2.04, BUN 28, magnesium 1.3, normal LFTs, CRP 1.2, white blood cell count 12.9, hemoglobin 10.2, platelets 310.  Past Medical History: SEE CHRONIC ISSSUES: Past Medical History:  Diagnosis Date  . Acute renal failure superimposed on stage 3 chronic kidney disease ([place]) 01/21/2016  . Anemia   . Anxiety   . B12 deficiency   . Brain tumor (benign) ([place]) 10/29/2020  . Cellulitis and abscess    Abdomen and buttocks  . CKD (chronic kidney disease) stage 3, GFR 30-59 ml/min (HCC)   . Closed fracture of distal end of femur, unspecified fracture morphology, initial encounter ([place])   . Depression   . Diabetic neuropathy (HCC)    feet  . DJD (degenerative joint disease)    Right forminal stenosis C4-5  . Essential hypertension   . Femur fracture, right ([place]) 03/30/2017  . Folliculitis   . GERD (gastroesophageal reflux disease)   . Gout   . Headache(784.0)   . Hiatal hernia   .  History of blood transfusion   . History of bronchitis   . History of cardiac catheterization    No significant CAD 2012  . History of kidney stones    surgery to remove  . Insomnia   . Irritable bowel syndrome   . Mixed hyperlipidemia   . Osteoarthritis   . Palpitations   . Pneumonia   . Reflux esophagitis   . Salmonella   . Stroke ([place])   . Tinnitus    Right  . Type 2 diabetes mellitus ([place])    Past Surgical History:  Past Surgical History:  Procedure Laterality Date  . BACK SURGERY    . c-spine surgery     03/2007   . CARDIAC CATHETERIZATION    . CHOLECYSTECTOMY    . COLONOSCOPY  04/21/2011; 05/29/11   6/12: [place] - ?AVM at IC valve, inflammatory changes at [facility] valve [facility]); 7/12 - [place]; IC valve erosions, look chronic and probably Crohn's per path  . colonoscopy  2017   [place]: random biopsies normal. TI normal  . ESOPHAGOGASTRODUODENOSCOPY  05/29/11   normal  . ESOPHAGOGASTRODUODENOSCOPY N/A 01/21/2016   Dr. Michail Sermon: normal  . EYE SURGERY Bilateral    cataracts removed  . FEMUR IM NAIL Right 04/10/2018   Procedure: INTRAMEDULLARY (IM) NAIL FEMORAL;  Surgeon: Paralee Cancel, MD;  Location: [facility];  Service: Orthopedics;  Laterality: Right;  . FRACTURE SURGERY    . GIVENS CAPSULE STUDY  11/17/2020  . HARDWARE REMOVAL Right 04/10/2018   Procedure: HARDWARE REMOVAL RIGHT DISTAL FEMUR;  Surgeon: Paralee Cancel, MD;  Location: [facility];  Service: Orthopedics;  Laterality: Right;  . HERNIA REPAIR    . IR FLUORO GUIDE CV LINE RIGHT  04/01/2017  . IR US GUIDE VASC ACCESS RIGHT  04/01/2017  . JOINT REPLACEMENT Right    hip  . OPEN REDUCTION INTERNAL FIXATION (ORIF) DISTAL RADIAL FRACTURE Right 05/28/2019   Procedure: OPEN REDUCTION INTERNAL FIXATION (ORIF) DISTAL RADIAL FRACTURE;  Surgeon: Verner Mould, MD;  Location: [place];  Service: Orthopedics;  Laterality: Right;  with block 90 minutes  . ORIF FEMUR FRACTURE Right 03/31/2017   Procedure: OPEN REDUCTION INTERNAL FIXATION (ORIF) DISTAL FEMUR FRACTURE;  Surgeon: Paralee Cancel, MD;  Location: [place];  Service: Orthopedics;  Laterality: Right;  . removal of kidney stone    . Right knee arthroscopic surgery    . TONSILLECTOMY    . TOTAL ABDOMINAL HYSTERECTOMY     Family History:  Family History  Problem Relation Age of Onset  . Diabetes Mother   . Colon cancer Brother        POSSIBLY. Patient states diagnosed at age 54, then later states age 78. unclear and limited historian  . Esophageal cancer Neg Hx   . Rectal cancer Neg Hx   .  Stomach cancer Neg Hx    Social History:  Social History   Tobacco Use  . Smoking status: Never Smoker  . Smokeless tobacco: Never Used  Vaping Use  . Vaping Use: Never used  Substance Use Topics  . Alcohol use: No  . Drug use: No    Home Medications:  Prior to Admission medications   Medication Sig Start Date End Date Taking? Authorizing Provider  acetaminophen (TYLENOL) 650 MG CR tablet Take 650 mg by mouth every 8 (eight) hours as needed for pain.   Yes [provider]  amLODipine (NORVASC) 5 MG tablet Take 5 mg by mouth daily. 05/23/20  Yes [provider]  Ascorbic Acid (VITAMIN C WITH ROSE HIPS) 500 MG tablet Take 500 mg by mouth every morning.   Yes [provider]  atorvastatin (LIPITOR) 80 MG tablet Take 80 mg by mouth every morning.   Yes [provider]  butalbital-acetaminophen-caffeine (FIORICET) 50-325-40 MG tablet Take 1 tablet by mouth every 6 (six) hours as needed for migraine. 05/19/20 05/19/21 Yes Barton Dubois, MD  Cholecalciferol (VITAMIN D) 2000 units CAPS Take 2,000 Units by mouth every morning.    Yes [provider]  citalopram (CELEXA) 40 MG tablet Take 40 mg by mouth daily.   Yes [provider]  CREON 36000-114000 units CPEP capsule Take 2 capsules (72,000 Units total) by mouth 3 (three) times daily. 11/04/20  Yes Gatha Mayer, MD  cyanocobalamin (,VITAMIN B-12,) 1000 MCG/ML injection Inject 1,000 mcg into the muscle every 30 (thirty) days.     Yes [provider]  dicyclomine (BENTYL) 20 MG tablet Take 1 tablet (20 mg total) by mouth every 6 (six) hours. 11/04/20  Yes Gatha Mayer, MD  diphenoxylate-atropine (LOMOTIL) 2.5-0.025 MG tablet 1-2 tablets qid prn diarrhea Instead of Imodium 12/06/20  Yes Gatha Mayer, MD  epoetin alfa-epbx (RETACRIT) 3000 UNIT/ML injection Inject 3,000 Units into the skin every 14 (fourteen) days. 11/21/20  Yes Bhutani, Manpreet S, MD  esomeprazole (NEXIUM) 40 MG  capsule Take 40 mg by mouth daily before breakfast.     Yes [provider]  fluconazole (DIFLUCAN) 150 MG tablet Take 150 mg by mouth once. 01/02/21  Yes [provider]  furosemide (LASIX) 40 MG tablet Take 1 tablet (40 mg total) by mouth daily. Patient taking differently: Take 40 mg by mouth 2 (two) times daily. 01/26/16  Yes Reyne Dumas, MD  gabapentin (NEURONTIN) 300 MG capsule Take 300 mg by mouth 3 (three) times daily. 10/20/20  Yes [provider]  hydrocortisone 2.5 % cream Apply topically 2 (two) times daily. 10/19/20  Yes Gatha Mayer, MD  indomethacin (INDOCIN) 25 MG capsule Take 1 capsule by mouth as needed.   Yes [provider]  insulin detemir (LEVEMIR FLEXTOUCH) 100 UNIT/ML FlexPen Inject 20 Units into the skin at bedtime. Patient adjust for her sugars Patient taking differently: Inject 80 Units into the skin at bedtime. Patient adjust for her sugars 05/19/20  Yes Barton Dubois, MD  Insulin Lispro (HUMALOG KWIKPEN [place]) Inject 20 Units into the skin 3 (three) times daily with meals. Sliding scale   Yes [provider]  labetalol (NORMODYNE) 300 MG tablet Take 1 tablet (300 mg total) by mouth 2 (two) times daily. 08/22/20  Yes BranchAlphonse Guild, MD  meclizine (ANTIVERT) 25 MG tablet Take 1 tablet by mouth as needed.   Yes [provider]  nystatin (MYCOSTATIN) 100000 UNIT/ML suspension Take 20 mLs by mouth as needed. 10/05/20  Yes [provider]  omega-3 acid ethyl esters (LOVAZA) 1 g capsule Take 2 capsules by mouth 2 (two) times daily. 10/11/20  Yes [provider]  ondansetron (ZOFRAN) 4 MG tablet Take 4 mg by mouth every 8 (eight) hours as needed for nausea. 08/24/20  Yes [provider]  promethazine (PHENERGAN) 25 MG tablet Take 25 mg by mouth as needed. 07/27/20  Yes [provider]  Semaglutide,0.25 or 0.5MG/DOS, (OZEMPIC, 0.25 OR 0.5 MG/DOSE,) 2 MG/1.5ML SOPN Inject 2 mg into the skin  every Sunday.    Yes [provider]  tiZANidine (ZANAFLEX) 4 MG tablet Take 8 mg by mouth at bedtime. 09/06/20  Yes [provider]  venlafaxine XR (EFFEXOR-XR) 75 MG 24 hr capsule Take 75 mg by mouth daily. 10/26/20  Yes [provider]  zolpidem (AMBIEN) 10 MG tablet Take 10 mg by mouth at bedtime. 05/15/20  Yes [provider]  Adalimumab (HUMIRA PEN) 40 MG/0.4ML PNKT Inject 40 mg into the skin every 14 (fourteen) days. After the completion of the starter kit Patient not taking: No sig reported 12/01/20   Gatha Mayer, MD  Adalimumab [facility] PEN-CD/UC/HS STARTER) 80 MG/0.8ML PNKT Inject 160 mg into the skin once for 1 dose. On day 0, then inject 80 mg on day 14 12/01/20 12/01/20  Gatha Mayer, MD  allopurinol (ZYLOPRIM) 100 MG tablet Take 100 mg by mouth daily. Patient not taking: Reported on 01/06/2021    [provider]  aspirin EC 81 MG tablet Take 81 mg by mouth every morning.  Patient not taking: Reported on 01/06/2021    [provider]  calcium carbonate (OS-CAL) 1250 (500 Ca) MG chewable tablet Chew 2 tablets by mouth daily. Patient not taking: Reported on 01/06/2021 10/13/20 10/13/21  [provider]  fenofibrate (TRICOR) 145 MG tablet Take 1 tablet (145 mg total) by mouth daily. Patient not taking: Reported on 01/06/2021 09/07/20   Arnoldo Lenis, MD  hydrALAZINE (APRESOLINE) 100 MG tablet Take 1 tablet (100 mg total) by mouth every 8 (eight) hours. Patient not taking: No sig reported 05/19/20   Barton Dubois, MD  methocarbamol (ROBAXIN) 500 MG tablet Take 1 tablet (500 mg total) by mouth every 6 (six) hours as needed for muscle spasms. Patient not taking: Reported on 01/06/2021 05/19/20   Barton Dubois, MD  ondansetron (ZOFRAN ODT) 4 MG disintegrating tablet Take 1 tablet (4 mg total) by mouth every 8 (eight) hours as needed. Patient not taking: No sig reported 11/04/20   Fredia Sorrow, MD  potassium chloride SA  (K-DUR,KLOR-CON) 20 MEQ tablet Take 20 mEq by mouth every morning.  Patient not taking: Reported on 01/06/2021    [provider]    Inpatient Medications:  Current Facility-Administered Medications:  .  0.9 % NaCl with KCl 20 mEq/ L  infusion, , Intravenous, Continuous, Tat, David, MD, Last Rate: 75 mL/hr at 01/07/21 0922, New Bag at 01/07/21 0922 .  atorvastatin (LIPITOR) tablet 80 mg, 80 mg, Oral, q morning, Truett Mainland, DO, 80 mg at 01/07/21 0350 .  citalopram (CELEXA) tablet 40 mg, 40 mg, Oral, Daily, Truett Mainland, DO, 40 mg at 01/07/21 0916 .  dicyclomine (BENTYL) capsule 20 mg, 20 mg, Oral, Q6H, Truett Mainland, DO, 20 mg at 01/07/21 0916 .  diphenoxylate-atropine (LOMOTIL) 2.5-0.025 MG per tablet 2 tablet, 2 tablet, Oral, QID PRN, Truett Mainland, DO .  enoxaparin (LOVENOX) injection 30 mg, 30 mg, Subcutaneous, Q24H, Truett Mainland, DO, 30 mg at 01/06/21 2222 .  feeding supplement (ENSURE ENLIVE / ENSURE PLUS) liquid 237 mL, 237 mL, Oral, BID BM, Stinson, Jacob J, DO .  gabapentin (NEURONTIN) capsule 300 mg, 300 mg, Oral, TID, Truett Mainland, DO, 300 mg at 01/07/21 0916 .  insulin aspart (novoLOG) injection 0-15 Units, 0-15 Units, Subcutaneous, TID WC, Truett Mainland, DO, 5 Units at 01/07/21 0825 .  insulin aspart (novoLOG) injection 0-5 Units, 0-5 Units, Subcutaneous, QHS, Stinson, Jacob J, DO .  insulin detemir (LEVEMIR) injection 25 Units, 25 Units, Subcutaneous, QHS, Tat, David, MD .  labetalol (NORMODYNE) tablet 100 mg, 100 mg, Oral, BID, Tat, David, MD, 100 mg  at 01/07/21 0916 .  lipase/protease/amylase (CREON) capsule 72,000 Units, 72,000 Units, Oral, TID WC, Truett Mainland, DO, 72,000 Units at 01/07/21 5631 .  meclizine (ANTIVERT) tablet 25 mg, 25 mg, Oral, Daily PRN, Truett Mainland, DO .  ondansetron [facility]) tablet 4 mg, 4 mg, Oral, Q8H PRN, Truett Mainland, DO .  oxyCODONE (Oxy IR/ROXICODONE) immediate release tablet 5 mg, 5 mg, Oral, Q4H PRN, Orson Eva, MD, 5 mg at 01/07/21 0916 .  pantoprazole (PROTONIX) EC tablet 40 mg, 40 mg, Oral, Daily, Truett Mainland, DO, 40 mg at 01/07/21 0917 .  promethazine (PHENERGAN) tablet 25 mg, 25 mg, Oral, Q4H PRN, Truett Mainland, DO .  tiZANidine (ZANAFLEX) tablet 8 mg, 8 mg, Oral, QHS, Stinson, Jacob J, DO .  venlafaxine XR (EFFEXOR-XR) 24 hr capsule 75 mg, 75 mg, Oral, Daily, Truett Mainland, DO, 75 mg at 01/07/21 4970 .  zolpidem (AMBIEN) tablet 5 mg, 5 mg, Oral, QHS, Stinson, Jacob J, DO, 5 mg at 01/06/21 2224 Allergies: Duloxetine hcl, Fluocinolone, Acetaminophen, Amlodipine besylate, Ciprofloxacin, Hydrocodone-acetaminophen, Metformin, Nsaids, Quinolones, Sulfamethoxazole, Valsartan, Cozaar [losartan], Lisinopril, and Tape  Complete Review of Systems: GENERAL: negative for malaise, night sweats HEENT: No changes in hearing or vision, no nose bleeds or other nasal problems. NECK: Negative for lumps, goiter, pain and significant neck swelling RESPIRATORY: Negative for cough, wheezing CARDIOVASCULAR: Negative for chest pain, leg swelling, palpitations, orthopnea GI: SEE HPI MUSCULOSKELETAL: Negative for joint pain or swelling, back pain, and muscle pain. SKIN: Negative for lesions, rash PSYCH: Negative for sleep disturbance, mood disorder and recent psychosocial stressors. HEMATOLOGY Negative for prolonged bleeding, bruising easily, and swollen nodes. ENDOCRINE: Negative for cold or heat intolerance, polyuria, polydipsia and goiter. NEURO: negative for tremor, gait imbalance, syncope and seizures. The remainder of the review of systems is noncontributory.  Physical Exam: BP (!) 123/51 (BP Location: Left Arm)   Pulse 79   Temp 98.1 F (36.7 C) (Oral)   Resp 20   Ht '5\' 3"'  (1.6 m)   Wt 60 kg   LMP  (LMP Unknown)   SpO2 100%   BMI 23.43 kg/m  GENERAL: The patient is AO x3, in no acute distress. HEENT: Head is normocephalic and atraumatic. EOMI are intact. Mouth is well hydrated and  without lesions. NECK: Supple. No masses LUNGS: Clear to auscultation. No presence of rhonchi/wheezing/rales. Adequate chest expansion HEART: RRR, normal s1 and s2. ABDOMEN: tender to palpation int he epigastric region but no guarding, no peritoneal signs, and nondistended. BS +. No masses. EXTREMITIES: Without any cyanosis, clubbing, rash, lesions or edema. NEUROLOGIC: AOx3, no focal motor deficit. SKIN: no jaundice, no rashes  Laboratory Data CBC:     Component Value Date/Time   WBC 9.6 01/07/2021 0611   RBC 2.81 (L) 01/07/2021 0611   HGB 8.4 (L) 01/07/2021 0611   HCT 26.2 (L) 01/07/2021 0611   PLT 199 01/07/2021 0611   MCV 93.2 01/07/2021 0611   MCH 29.9 01/07/2021 0611   MCHC 32.1 01/07/2021 0611   RDW 13.9 01/07/2021 0611   LYMPHSABS 2.7 01/06/2021 1403   MONOABS 0.7 01/06/2021 1403   EOSABS 0.4 01/06/2021 1403   BASOSABS 0.1 01/06/2021 1403   COAG:  Lab Results  Component Value Date   INR 1.0 05/17/2020   INR 1.57 (H) 01/21/2016   INR 1.00 12/22/2014    BMP:  BMP Latest Ref Rng & Units 01/07/2021 01/06/2021 11/04/2020  Glucose 70 - 99 mg/dL 289(H) 100(H) 305(H)  BUN 8 -  23 mg/dL 23 28(H) 44(H)  Creatinine 0.44 - 1.00 mg/dL 1.80(H) 2.04(H) 2.42(H)  BUN/Creat Ratio 12 - 28 - - -  Sodium 135 - 145 mmol/L 136 134(L) 131(L)  Potassium 3.5 - 5.1 mmol/L 3.8 2.3(LL) 4.2  Chloride 98 - 111 mmol/L 106 94(L) 98  CO2 22 - 32 mmol/L 19(L) 24 22  Calcium 8.9 - 10.3 mg/dL 8.1(L) 9.3 7.7(L)    HEPATIC:  Hepatic Function Latest Ref Rng & Units 01/06/2021 11/04/2020 05/17/2020  Total Protein 6.5 - 8.1 g/dL 7.7 7.3 7.1  Albumin 3.5 - 5.0 g/dL 3.7 3.8 4.0  AST 15 - 41 U/L '18 17 18  ' ALT 0 - 44 U/L '15 16 15  ' Alk Phosphatase 38 - 126 U/L 91 82 57  Total Bilirubin 0.3 - 1.2 mg/dL 0.2(L) 0.4 0.4    CARDIAC:  Lab Results  Component Value Date   CKTOTAL 185 03/31/2017   TROPONINI 0.04 ([place]) 04/03/2017     Imaging: I personally reviewed and interpreted the available  imaging.  Assessment & Plan: Heather [name] is a 70 y.o. female with history of small bowel Crohn's disease, CKD, depression, diabetes, GERD, anxiety, IBS, hyperlipidemia, stroke and salmonellosis, who was admitted to the hospital after being found to have severe hypotension and hypoglycemia at her nephrologist office.  Gastroenterology was consulted given worsening abdominal pain and diarrhea.  The patient has had extensive investigations in the past that have included she had small bowel Crohn's disease.  Currently, she is presenting chronic symptoms of diarrhea and abdominal pain that have slightly worsened recently, causing major electrolyte abnormalities such as hypokalemia.  It is possible that her Crohn's disease has flared recently which will need to be further evaluated with stool studies including GI pathogen panel and as this can trigger this event.  At this point, we will need to obtain some cross section of abdominal imaging to determine if she has any evidence of active inflammation.  In terms of her chronic management, she has only received 2 doses of Humira and checking her medication levels will be useful after at least receiving 4 doses of the medication.  If she indeed has a flare, given her poor tolerance to prednisone in the past (patient adamantly refuses to be on this medication), will consider induction of remission with a trial of budesonide 9 mg every day.  Patient understood and agreed.  # Chronic abdominal pain/diarrhea #Small bowel Crohn's disease - Follow GI path in stool - If negative stool studies, can restart Imodium standing TID - Proceed with CT abdomen and pelvis - Zofran as needed for nausea - May consider budesonide 9 mg qday as induction therapy - Will require close follow up with GI (Dr. Carlean Purl) at [facility], MD Gastroenterology and Hepatology [facility]

## 2021-01-07 NOTE — Progress Notes (Signed)
Patient stated she felt hot and sweaty. She stated she felt "off". Patient was hypotensive. MD notified and new orders obtained.

## 2021-01-07 NOTE — Progress Notes (Signed)
Lab called only able to do GI panel, not c,.Difficile , d/t patient had a formed stool . Notified Dr. Carles Collet

## 2021-01-07 NOTE — Progress Notes (Signed)
PROGRESS NOTE  Heather [name] CZY:[id number] DOB: 15-Sep-1951 DOA: 01/06/2021 PCP: [name], MD  Brief History:  70 year old female with a history of diabetes mellitus type 2, hyperlipidemia, Crohn's disease, CKD stage III, B12 deficiency, anxiety disorder presenting from her nephrology office secondary to elevated blood pressures. The patient is a difficult historian, but she is able to give generalities.  The details of her history are somewhat conflicting.  Nevertheless, it appears that the patient has been having nausea, vomiting, and diarrhea with worsening abdominal pain since 01/02/2021.  She states that she began taking Lomotil, but it did not help much.  There is no hematemesis, hematochezia or melena.  Therefore, she quit taking it on 01/04/2021.  She had denied any fevers, chills, headache, sore throat, coughing, hemoptysis, chest pain, shortness of breath, dysuria or hematuria. Apparently, she states that over the past 2 days she has noted some high readings on her glucometer at home.  However, she says that she has had decreased oral intake since 01/02/2021.  She feels that each time she eats she has increase of her loose stools.  She denies any antibiotics or other new medications.  Apparently, she went to see her usual nephrologist, Dr. Theador Hawthorne, on 01/06/2021.  She was noted to have elevated blood pressures in the office.  As result, she was advised to go to the emergency department for further evaluation. In the emergency department, the patient was afebrile hemodynamically stable with oxygen saturation 98% on room air.  She was noted to be hypertensive with blood pressure up to 218/85.  She was given hydralazine IV with improvement.  In addition, the patient was noted to have acute on chronic renal failure with serum creatinine up to 2.04.  This is above her usual baseline.  In addition, she was noted to have hypokalemia with a potassium 2.3 and hypomagnesemia with potassium 1.3.   WBC 12.9, hemoglobin 10.2, platelets 210,000.  Due to her electrolyte abnormalities and acute kidney injury, the patient was minute for further evaluation and treatment.  Since arrival to the hospital (nearly 20 hours now), the patient states that she has only had 1 bowel movement, but none has been documented by nursing.  She has not had any hematochezia or melena.   Assessment/Plan: Acute on chronic renal failure--CKD 3b -Baseline creatinine 1.5-1.7 -Presented with serum creatinine 2.04 -Secondary to volume depletion -Continue IV fluids -hold lasix  Hypokalemia -Secondary to GI loss -Improving with replacement  Hypomagnesemia -Replete  Diarrhea -As discussed above, there has been no documented BMs since admission -If diarrhea should recur--> collect stool for C. difficile and stool pathogen panel -CT abdomen and pelvis as the patient states that she has abdominal pain worse than usual -CRP 1.2 -ESR 77  Hypertension>>>Hypotension -The patient was given labetalol 300 mg at 2230, dilaudid at 2220, and hydralazine 10 mg IV at 1824 on 01/06/21 -This resulted in the patient developing hypotension -Fluid boluses have mitigated her low blood pressure -Continue IV fluids -Hold amlodipine -Decrease home dose of labetalol  Diabetes mellitus type 2 -Interestingly, the patient was actually hypoglycemic in the ED in contrast to her reported "high" sugars on her glucometer at home -05/17/2020 hemoglobin A1c 6.9 -Decrease home Lantus -NovoLog sliding scale -hold Ozempic  Hyperlipidemia -continue statin  Depression/Anxiety -continue Effexor       Status is: Inpatient  Remains inpatient appropriate because:IV treatments appropriate due to intensity of illness or inability to take PO  Dispo: The patient is from: Home              Anticipated d/c is to: Home              Patient currently is not medically stable to d/c.   Difficult to place patient No        Family  Communication:   Family at bedside  Consultants:  none  Code Status:  FULL   DVT Prophylaxis:  [place] Heparin / [place] Lovenox   Procedures: As Listed in Progress Note Above  Antibiotics: None      Subjective: Pt states she has had only one BM since admission.  Denies f/c, cp, n/v/d, hematochezia, melena.  Complains of moderate upper abd pain.    Objective: Vitals:   01/07/21 0020 01/07/21 0148 01/07/21 0639 01/07/21 0651  BP: (!) 95/37 (!) 110/47 (!) 115/53 (!) 123/51  Pulse: 81 87 76 79  Resp:  '16 15 20  ' Temp:  97.8 F (36.6 C) 97.8 F (36.6 C) 98.1 F (36.7 C)  TempSrc:  Oral Oral Oral  SpO2:  98% 100% 100%  Weight:      Height:        Intake/Output Summary (Last 24 hours) at 01/07/2021 0810 Last data filed at 01/07/2021 0444 Gross per 24 hour  Intake 2734.33 ml  Output 1 ml  Net 2733.33 ml   Weight change:  Exam:   General:  Pt is alert, follows commands appropriately, not in acute distress  HEENT: No icterus, No thrush, No neck mass, [place]/AT  Cardiovascular: RRR, S1/S2, no rubs, no gallops  Respiratory: CTA bilaterally, no wheezing, no crackles, no rhonchi  Abdomen: Soft/+BS, upper tender, non distended, no guarding  Extremities: No edema, No lymphangitis, No petechiae, No rashes, no synovitis   Data Reviewed: I have personally reviewed following labs and imaging studies Basic Metabolic Panel: Recent Labs  Lab 01/06/21 1403  NA 134*  K 2.3*  CL 94*  CO2 24  GLUCOSE 100*  BUN 28*  CREATININE 2.04*  CALCIUM 9.3  MG 1.3*   Liver Function Tests: Recent Labs  Lab 01/06/21 1403  AST 18  ALT 15  ALKPHOS 91  BILITOT 0.2*  PROT 7.7  ALBUMIN 3.7   No results for input(s): LIPASE, AMYLASE in the last 168 hours. No results for input(s): AMMONIA in the last 168 hours. Coagulation Profile: No results for input(s): INR, PROTIME in the last 168 hours. CBC: Recent Labs  Lab 01/06/21 1403 01/07/21 0611  WBC 12.9* 9.6  NEUTROABS 8.7*  --   HGB  10.2* 8.4*  HCT 30.3* 26.2*  MCV 89.6 93.2  PLT 310 199   Cardiac Enzymes: No results for input(s): CKTOTAL, CKMB, CKMBINDEX, TROPONINI in the last 168 hours. BNP: Invalid input(s): POCBNP CBG: Recent Labs  Lab 01/06/21 1304 01/06/21 1323 01/06/21 2049  GLUCAP 66* 70 167*   HbA1C: No results for input(s): HGBA1C in the last 72 hours. Urine analysis:    Component Value Date/Time   COLORURINE YELLOW 03/02/2020 1420   APPEARANCEUR CLEAR 03/02/2020 1420   LABSPEC 1.012 03/02/2020 1420   PHURINE 5.0 03/02/2020 1420   GLUCOSEU NEGATIVE 03/02/2020 1420   GLUCOSEU NEGATIVE 03/28/2018 1224   HGBUR NEGATIVE 03/02/2020 1420   BILIRUBINUR NEGATIVE 03/02/2020 1420   [place] 03/02/2020 1420   PROTEINUR 100 (A) 03/02/2020 1420   UROBILINOGEN 1.0 03/28/2018 1224   NITRITE NEGATIVE 03/02/2020 1420   LEUKOCYTESUR TRACE (A) 03/02/2020 1420   Sepsis Labs: '@LABRCNTIP' (procalcitonin:4,lacticidven:4) )  Recent Results (from the past 240 hour(s))  Resp Panel by RT-PCR (Flu A&B, Covid) Nasopharyngeal Swab     Status: None   Collection Time: 01/06/21  4:02 PM   Specimen: Nasopharyngeal Swab; Nasopharyngeal(NP) swabs in vial transport medium  Result Value Ref Range Status   SARS Coronavirus 2 by RT PCR NEGATIVE NEGATIVE Final    Comment: (NOTE) SARS-CoV-2 target nucleic acids are NOT DETECTED.  The SARS-CoV-2 RNA is generally detectable in upper respiratory specimens during the acute phase of infection. The lowest concentration of SARS-CoV-2 viral copies this assay can detect is 138 copies/mL. A negative result does not preclude SARS-Cov-2 infection and should not be used as the sole basis for treatment or other patient management decisions. A negative result may occur with  improper specimen collection/handling, submission of specimen other than nasopharyngeal swab, presence of viral mutation(s) within the areas targeted by this assay, and inadequate number of  viral copies(<138 copies/mL). A negative result must be combined with clinical observations, patient history, and epidemiological information. The expected result is Negative.  Fact Sheet for Patients:  EntrepreneurPulse.com.au  Fact Sheet for Healthcare Providers:  IncredibleEmployment.be  This test is no t yet approved or cleared by the [place] FDA and  has been authorized for detection and/or diagnosis of SARS-CoV-2 by FDA under an Emergency Use Authorization (EUA). This EUA will remain  in effect (meaning this test can be used) for the duration of the COVID-19 declaration under Section 564(b)(1) of the Act, 21 U.S.C.section 360bbb-3(b)(1), unless the authorization is terminated  or revoked sooner.       Influenza A by PCR NEGATIVE NEGATIVE Final   Influenza B by PCR NEGATIVE NEGATIVE Final    Comment: (NOTE) The Xpert Xpress SARS-CoV-2/FLU/RSV plus assay is intended as an aid in the diagnosis of influenza from Nasopharyngeal swab specimens and should not be used as a sole basis for treatment. Nasal washings and aspirates are unacceptable for Xpert Xpress SARS-CoV-2/FLU/RSV testing.  Fact Sheet for Patients: EntrepreneurPulse.com.au  Fact Sheet for Healthcare Providers: IncredibleEmployment.be  This test is not yet approved or cleared by the [place] FDA and has been authorized for detection and/or diagnosis of SARS-CoV-2 by FDA under an Emergency Use Authorization (EUA). This EUA will remain in effect (meaning this test can be used) for the duration of the COVID-19 declaration under Section 564(b)(1) of the Act, 21 U.S.C. section 360bbb-3(b)(1), unless the authorization is terminated or revoked.  Performed at [facility], [address] [place], [place] [address]      Scheduled Meds: . atorvastatin  80 mg Oral q morning  . citalopram  40 mg Oral Daily  . dicyclomine  20 mg Oral Q6H   . enoxaparin (LOVENOX) injection  30 mg Subcutaneous Q24H  . feeding supplement  237 mL Oral BID BM  . gabapentin  300 mg Oral TID  . insulin aspart  0-15 Units Subcutaneous TID WC  . insulin aspart  0-5 Units Subcutaneous QHS  . insulin detemir  25 Units Subcutaneous QHS  . labetalol  100 mg Oral BID  . lipase/protease/amylase  72,000 Units Oral TID WC  . pantoprazole  40 mg Oral Daily  . potassium chloride  40 mEq Oral BID  . tiZANidine  8 mg Oral QHS  . venlafaxine XR  75 mg Oral Daily  . zolpidem  5 mg Oral QHS   Continuous Infusions: . sodium chloride 75 mL/hr at 01/07/21 0444    Procedures/Studies: No results found.  Orson Eva, DO  [facility]  If 7PM-7AM, please contact night-coverage [email or web address] Password [email or web address] 01/07/2021, 8:10 AM   LOS: 1 day

## 2021-01-07 NOTE — Plan of Care (Signed)

## 2021-01-08 DIAGNOSIS — K859 Acute pancreatitis without necrosis or infection, unspecified: Secondary | ICD-10-CM

## 2021-01-08 DIAGNOSIS — E876 Hypokalemia: Secondary | ICD-10-CM | POA: Diagnosis not present

## 2021-01-08 DIAGNOSIS — K8500 Idiopathic acute pancreatitis without necrosis or infection: Secondary | ICD-10-CM

## 2021-01-08 DIAGNOSIS — K861 Other chronic pancreatitis: Secondary | ICD-10-CM

## 2021-01-08 DIAGNOSIS — N179 Acute kidney failure, unspecified: Secondary | ICD-10-CM | POA: Diagnosis not present

## 2021-01-08 DIAGNOSIS — R197 Diarrhea, unspecified: Secondary | ICD-10-CM | POA: Diagnosis not present

## 2021-01-08 DIAGNOSIS — N1832 Chronic kidney disease, stage 3b: Secondary | ICD-10-CM | POA: Diagnosis not present

## 2021-01-08 LAB — CBC
HCT: 28.6 % — ABNORMAL LOW (ref 36.0–46.0)
Hemoglobin: 8.7 g/dL — ABNORMAL LOW (ref 12.0–15.0)
MCH: 29.6 pg (ref 26.0–34.0)
MCHC: 30.4 g/dL (ref 30.0–36.0)
MCV: 97.3 fL (ref 80.0–100.0)
Platelets: 219 10*3/uL (ref 150–400)
RBC: 2.94 MIL/uL — ABNORMAL LOW (ref 3.87–5.11)
RDW: 14.5 % (ref 11.5–15.5)
WBC: 8.3 10*3/uL (ref 4.0–10.5)
nRBC: 0 % (ref 0.0–0.2)

## 2021-01-08 LAB — GASTROINTESTINAL PANEL BY PCR, STOOL (REPLACES STOOL CULTURE)

## 2021-01-08 LAB — COMPREHENSIVE METABOLIC PANEL
ALT: 11 U/L (ref 0–44)
AST: 20 U/L (ref 15–41)
Albumin: 3.2 g/dL — ABNORMAL LOW (ref 3.5–5.0)
Alkaline Phosphatase: 74 U/L (ref 38–126)
Anion gap: 14 (ref 5–15)
BUN: 23 mg/dL (ref 8–23)
CO2: 16 mmol/L — ABNORMAL LOW (ref 22–32)
Calcium: 8.7 mg/dL — ABNORMAL LOW (ref 8.9–10.3)
Chloride: 109 mmol/L (ref 98–111)
Creatinine, Ser: 1.84 mg/dL — ABNORMAL HIGH (ref 0.44–1.00)
GFR, Estimated: 30 mL/min — ABNORMAL LOW (ref >60)
Glucose, Bld: 231 mg/dL — ABNORMAL HIGH (ref 70–99)
Potassium: 3.8 mmol/L (ref 3.5–5.1)
Sodium: 139 mmol/L (ref 135–145)
Total Bilirubin: 0.1 mg/dL — ABNORMAL LOW (ref 0.3–1.2)
Total Protein: 6.4 g/dL — ABNORMAL LOW (ref 6.5–8.1)

## 2021-01-08 LAB — TRIGLYCERIDES: Triglycerides: 597 mg/dL — ABNORMAL HIGH (ref <150)

## 2021-01-08 MED ORDER — DICYCLOMINE HCL 10 MG PO CAPS
20.0000 mg | ORAL_CAPSULE | Freq: Three times a day (TID) | ORAL | Status: DC
  Administered 2021-01-08 – 2021-01-12 (×14): 20 mg via ORAL
  Filled 2021-01-08 (×14): qty 2

## 2021-01-08 MED ORDER — PROSOURCE PLUS PO LIQD
30.0000 mL | Freq: Two times a day (BID) | ORAL | Status: DC
  Administered 2021-01-09 – 2021-01-11 (×4): 30 mL via ORAL
  Filled 2021-01-08 (×4): qty 30

## 2021-01-08 MED ORDER — LACTATED RINGERS IV SOLN: INTRAVENOUS | Status: DC

## 2021-01-08 MED ORDER — HYDROMORPHONE HCL 1 MG/ML IJ SOLN
0.5000 mg | INTRAMUSCULAR | Status: DC | PRN
  Administered 2021-01-08 – 2021-01-11 (×9): 0.5 mg via INTRAVENOUS
  Filled 2021-01-08 (×11): qty 0.5

## 2021-01-08 MED ORDER — BOOST / RESOURCE BREEZE PO LIQD CUSTOM
1.0000 | Freq: Three times a day (TID) | ORAL | Status: DC
  Administered 2021-01-09 – 2021-01-11 (×6): 1 via ORAL

## 2021-01-08 MED ORDER — RIFAXIMIN 200 MG PO TABS
200.0000 mg | ORAL_TABLET | Freq: Three times a day (TID) | ORAL | Status: DC
  Administered 2021-01-08 – 2021-01-12 (×11): 200 mg via ORAL
  Filled 2021-01-08 (×15): qty 1

## 2021-01-08 MED ORDER — FENOFIBRATE 160 MG PO TABS
160.0000 mg | ORAL_TABLET | Freq: Every day | ORAL | Status: DC
  Administered 2021-01-08 – 2021-01-12 (×5): 160 mg via ORAL
  Filled 2021-01-08 (×5): qty 1

## 2021-01-08 NOTE — Plan of Care (Signed)

## 2021-01-08 NOTE — Progress Notes (Signed)
Patient has had two urine incontinent episodes this shift which she states she did not know why

## 2021-01-08 NOTE — Progress Notes (Signed)
Maylon Peppers, M.D. Gastroenterology & Hepatology   Interval History:  Patient reports having persistent epigastric abdominal pain and nausea, vomited 2 times yesterday.  States that she had 4 episodes of watery bowel movements yesterday which were nonbloody in nature.  Denies having any fever or chills. Has had poor oral intake as eating causes worsening pain. Patient underwent a CT of the abdomen and pelvis without IV contrast due to kidney disease which showed presence of fat stranding and thickening of the pancreas, worse in the pancreatic tail without ductal dilation.  I personally reviewed these images which actually have slight appearance that could be described as a sausage shaped pancreas.  Lipase was elevated 164. Notably, the patient had markedly elevated triglycerides 4 months ago which were in the 985 range.   Inpatient Medications:  Current Facility-Administered Medications:  .  acetaminophen (TYLENOL) tablet 650 mg, 650 mg, Oral, Q6H, Tat, David, MD, 650 mg at 01/07/21 1718 .  atorvastatin (LIPITOR) tablet 80 mg, 80 mg, Oral, q morning, Truett Mainland, DO, 80 mg at 01/08/21 0856 .  citalopram (CELEXA) tablet 40 mg, 40 mg, Oral, Daily, Truett Mainland, DO, 40 mg at 01/08/21 9470 .  dicyclomine (BENTYL) capsule 20 mg, 20 mg, Oral, TID AC, Harvel Quale, MD .  diphenoxylate-atropine (LOMOTIL) 2.5-0.025 MG per tablet 2 tablet, 2 tablet, Oral, QID PRN, Truett Mainland, DO .  enoxaparin (LOVENOX) injection 30 mg, 30 mg, Subcutaneous, Q24H, Truett Mainland, DO, 30 mg at 01/07/21 2120 .  feeding supplement (ENSURE ENLIVE / ENSURE PLUS) liquid 237 mL, 237 mL, Oral, BID BM, Truett Mainland, DO, 237 mL at 01/08/21 1036 .  gabapentin (NEURONTIN) capsule 300 mg, 300 mg, Oral, TID, Truett Mainland, DO, 300 mg at 01/08/21 0856 .  insulin aspart (novoLOG) injection 0-15 Units, 0-15 Units, Subcutaneous, TID WC, Truett Mainland, DO, 3 Units at 01/08/21 9628 .  insulin aspart  (novoLOG) injection 0-5 Units, 0-5 Units, Subcutaneous, QHS, Truett Mainland, DO, 4 Units at 01/07/21 2121 .  insulin detemir (LEVEMIR) injection 25 Units, 25 Units, Subcutaneous, Benay Pike, MD, 25 Units at 01/07/21 2120 .  labetalol (NORMODYNE) tablet 100 mg, 100 mg, Oral, BID, Tat, David, MD, 100 mg at 01/08/21 0902 .  lactated ringers infusion, , Intravenous, Continuous, Montez Morita, Daniel, MD .  lidocaine (LIDODERM) 5 % 1 patch, 1 patch, Transdermal, Q24H, Orson Eva, MD, 1 patch at 01/07/21 1719 .  lipase/protease/amylase (CREON) capsule 72,000 Units, 72,000 Units, Oral, TID WC, Truett Mainland, DO, 72,000 Units at 01/08/21 0901 .  meclizine (ANTIVERT) tablet 25 mg, 25 mg, Oral, Daily PRN, Truett Mainland, DO .  ondansetron [facility]) tablet 4 mg, 4 mg, Oral, Q8H PRN, Truett Mainland, DO .  oxyCODONE (Oxy IR/ROXICODONE) immediate release tablet 5 mg, 5 mg, Oral, Q4H PRN, Tat, David, MD, 5 mg at 01/08/21 1035 .  pantoprazole (PROTONIX) EC tablet 40 mg, 40 mg, Oral, Daily, Truett Mainland, DO, 40 mg at 01/08/21 0856 .  promethazine (PHENERGAN) tablet 25 mg, 25 mg, Oral, Q4H PRN, Truett Mainland, DO .  tiZANidine (ZANAFLEX) tablet 8 mg, 8 mg, Oral, QHS, Stinson, Jacob J, DO .  venlafaxine XR (EFFEXOR-XR) 24 hr capsule 75 mg, 75 mg, Oral, Daily, Truett Mainland, DO, 75 mg at 01/08/21 0903 .  zolpidem (AMBIEN) tablet 5 mg, 5 mg, Oral, QHS, Truett Mainland, DO, 5 mg at 01/07/21 2120   I/O    Intake/Output Summary (Last 24  hours) at 01/08/2021 1038 Last data filed at 01/08/2021 0500 Gross per 24 hour  Intake 1131.61 ml  Output -  Net 1131.61 ml     Physical Exam: Temp:  [97.5 F (36.4 C)-98.2 F (36.8 C)] 97.5 F (36.4 C) (03/13 0550) Pulse Rate:  [78-82] 79 (03/13 0550) Resp:  [20] 20 (03/13 0550) BP: (122-165)/(49-60) 125/54 (03/13 0550) SpO2:  [100 %] 100 % (03/13 0550)  Temp (24hrs), Avg:97.9 F (36.6 C), Min:97.5 F (36.4 C), Max:98.2 F (36.8 C) GENERAL: The  patient is AO x3, in no acute distress. HEENT: Head is normocephalic and atraumatic. EOMI are intact. Mouth is well hydrated and without lesions. NECK: Supple. No masses LUNGS: Clear to auscultation. No presence of rhonchi/wheezing/rales. Adequate chest expansion HEART: RRR, normal s1 and s2. ABDOMEN: tender to palpation in the epigastric area, no guarding, no peritoneal signs, and nondistended. BS +. No masses. EXTREMITIES: Without any cyanosis, clubbing, rash, lesions or edema. NEUROLOGIC: AOx3, no focal motor deficit. SKIN: no jaundice, no rashes  Laboratory Data: CBC:     Component Value Date/Time   WBC 8.3 01/08/2021 0524   RBC 2.94 (L) 01/08/2021 0524   HGB 8.7 (L) 01/08/2021 0524   HCT 28.6 (L) 01/08/2021 0524   PLT 219 01/08/2021 0524   MCV 97.3 01/08/2021 0524   MCH 29.6 01/08/2021 0524   MCHC 30.4 01/08/2021 0524   RDW 14.5 01/08/2021 0524   LYMPHSABS 2.7 01/06/2021 1403   MONOABS 0.7 01/06/2021 1403   EOSABS 0.4 01/06/2021 1403   BASOSABS 0.1 01/06/2021 1403   COAG:  Lab Results  Component Value Date   INR 1.0 05/17/2020   INR 1.57 (H) 01/21/2016   INR 1.00 12/22/2014    BMP:  BMP Latest Ref Rng & Units 01/08/2021 01/07/2021 01/06/2021  Glucose 70 - 99 mg/dL 231(H) 289(H) 100(H)  BUN 8 - 23 mg/dL 23 23 28(H)  Creatinine 0.44 - 1.00 mg/dL 1.84(H) 1.80(H) 2.04(H)  BUN/Creat Ratio 12 - 28 - - -  Sodium 135 - 145 mmol/L 139 136 134(L)  Potassium 3.5 - 5.1 mmol/L 3.8 3.8 2.3(LL)  Chloride 98 - 111 mmol/L 109 106 94(L)  CO2 22 - 32 mmol/L 16(L) 19(L) 24  Calcium 8.9 - 10.3 mg/dL 8.7(L) 8.1(L) 9.3    HEPATIC:  Hepatic Function Latest Ref Rng & Units 01/08/2021 01/06/2021 11/04/2020  Total Protein 6.5 - 8.1 g/dL 6.4(L) 7.7 7.3  Albumin 3.5 - 5.0 g/dL 3.2(L) 3.7 3.8  AST 15 - 41 U/L '20 18 17  ' ALT 0 - 44 U/L '11 15 16  ' Alk Phosphatase 38 - 126 U/L 74 91 82  Total Bilirubin 0.3 - 1.2 mg/dL 0.1(L) 0.2(L) 0.4    CARDIAC:  Lab Results  Component Value Date   CKTOTAL  185 03/31/2017   TROPONINI 0.04 ([place]) 04/03/2017      Imaging: I personally reviewed and interpreted the available labs, imaging and endoscopic files.   Assessment/Plan: Heather [name] is a 70 y.o. female with history of small bowel Crohn's disease, CKD, depression, diabetes, GERD, anxiety, previous history of pancreatitis, IBS, hyperlipidemia, stroke and salmonellosis, who was admitted to the hospital after being found to have severe hypertension and hypoglycemia at her nephrologist office.  Gastroenterology was consulted given worsening abdominal pain and diarrhea.  The patient has had extensive investigations in the past that have concluded she has small bowel Crohn's disease.  Currently, she is presenting chronic symptoms of diarrhea and abdominal pain that have worsened recently, causing major electrolyte abnormalities  such as hypokalemia.    Due to this, the patient underwent a CT of the abdomen without IV contrast which showed presence of changes consistent with new onset pancreatitis.  The patient reported that she had a previous episode of acute pancreatitis in the past but there was no explanation for this.  The reason behind her new episode of pancreatitis is not clear to me as she has multiple contributing factors such as the use of semaglutide and significant hypertriglyceridemia in the past (value of more than 900 4 months ago).  I reviewed the images and consider it is possible that her pancreas has a sausage-shape which may suggest autoimmune pancreatitis -this will need to be reviewed with radiologist tomorrow.  Due to this, will obtain triglyceride levels and IgG4 serology.  It is imperative to ensure she has adequate control of her hypertriglyceridemia and will recommend avoiding the use of semaglutide.  For now, she can continue with pain management to control her epigastric pain, but also she will be switched to lactated Ringer's infusion, diet can be advanced as tolerated.  It is  possible that her Crohn's disease also explains part of her symptoms, as it could have flared recently which will need to be further evaluated with stool studies -these studies are pending.   In terms of her chronic management, she has only received 2 doses of Humira and checking her medication levels will be useful after at least receiving 4 doses of the medication.  I had a thorough discussion with the patient regarding the potential use of prednisone to improve some of her symptoms while she reaches the adequate levels of Humira to induce remission.  I also explained to her that if she has autoimmune pancreatitis, prednisone may have a role in controlling her disease.  The patient was hesitant to start his medication unless is really needed as she is fearful of her hyperglycemia and weight gain.  However, she is agreeable to start this medication if she is presenting persistent refractory symptoms.  # Acute pancreatitis # Chronic abdominal pain/diarrhea #Small bowel Crohn's disease - Check TGC and IgG4 - Will review images with radiologist tomorrow - Strict TGC control - Avoid semaglutide - LR @ 75 cc/h - Pain control per primary team, avoid NSAIDs - Follow GI path in stool - If negative stool studies, can restart Imodium standing TID - Zofran as needed for nausea - May consider prednisone if not clinically improving - Will require close follow up with GI (Dr. Carlean Purl) at [facility], MD Gastroenterology and Hepatology [facility]

## 2021-01-08 NOTE — Progress Notes (Signed)
Initial Nutrition Assessment  DOCUMENTATION CODES:   Not applicable  INTERVENTION:   D/c Ensure as it is not appropriate for a clear liquid diet. Once diet is advance, this may be ordered again.  Boost Breeze po TID, each supplement provides 250 kcal and 9 grams of protein  34ml Prosource Plus po BID, each supplement provides 100 kcals and 15 grams of protein  NUTRITION DIAGNOSIS:   Inadequate oral intake related to nausea,vomiting as evidenced by per patient/family report.  GOAL:   Patient will meet greater than or equal to 90% of their needs  MONITOR:   PO intake,Supplement acceptance,Diet advancement,Weight trends,I & O's,Labs  REASON FOR ASSESSMENT:   Malnutrition Screening Tool    ASSESSMENT:   Pt admitted with acute pancreatitis and acute on chronic renal failure. PMH includes small bowel Crohn's disease, CKD stage 3, depression, diabetes, GERD, anxiety, previous history of pancreatitis, IBS, hyperlipidemia, stroke and salmonellosis.  Pt reports having persistent epigastric abdominal pain, nausea, vomiting, and diarrhea. Pt states this has caused her to have poor po intake. Only two meals recorded since admit, 100% meal completion x 2 clear liquid trays. Will order oral nutrition supplements. Note pt with orders for Ensure but is on a clear liquid diet (Ensure is not considered a clear liquid).   No UOP documented.   Labs: Cr 1.84 (H, higher than yesterday) CBGs have not been updated since 3/11.  Medications: ss novolog TID w/ meals and bedtime, 25 units levemir nightly, creon TID w/ meals, protonix  NUTRITION - FOCUSED PHYSICAL EXAM: Unable to perform at this time as RD is working remotely from another campus. Will attempt at follow-up.   Diet Order:   Diet Order            Diet clear liquid Room service appropriate? Yes; Fluid consistency: Thin  Diet effective now                 EDUCATION NEEDS:   No education needs have been identified at this  time  Skin:  Skin Assessment: Reviewed RN Assessment  Last BM:  3/12  Height:   Ht Readings from Last 1 Encounters:  01/06/21 5\' 3"  (1.6 m)    Weight:   Wt Readings from Last 1 Encounters:  01/06/21 60 kg   BMI:  Body mass index is 23.43 kg/m.  Estimated Nutritional Needs:   Kcal:  1500-1700  Protein:  75-85 grams  Fluid:  >1.5L    Larkin Ina, MS, RD, LDN RD pager number and weekend/on-call pager number located in [place].

## 2021-01-08 NOTE — Progress Notes (Signed)
PROGRESS NOTE  Heather [name] QIW:[id number] DOB: January 26, 1951 DOA: 01/06/2021 PCP: [name], MD   Brief History:  70 year old female with a history of diabetes mellitus type 2, hyperlipidemia, Crohn's disease, CKD stage III, B12 deficiency, anxiety disorder presenting from her nephrology office secondary to elevated blood pressures. The patient is a difficult historian, but she is able to give generalities.  The details of her history are somewhat conflicting.  Nevertheless, it appears that the patient has been having nausea, vomiting, and diarrhea with worsening abdominal pain since 01/02/2021.  She states that she began taking Lomotil, but it did not help much.  There is no hematemesis, hematochezia or melena.  Therefore, she quit taking it on 01/04/2021.  She had denied any fevers, chills, headache, sore throat, coughing, hemoptysis, chest pain, shortness of breath, dysuria or hematuria. Apparently, she states that over the past 2 days she has noted some high readings on her glucometer at home.  However, she says that she has had decreased oral intake since 01/02/2021.  She feels that each time she eats she has increase of her loose stools.  She denies any antibiotics or other new medications.  Apparently, she went to see her usual nephrologist, Dr. Theador Hawthorne, on 01/06/2021.  She was noted to have elevated blood pressures in the office.  As result, she was advised to go to the emergency department for further evaluation. In the emergency department, the patient was afebrile hemodynamically stable with oxygen saturation 98% on room air.  She was noted to be hypertensive with blood pressure up to 218/85.  She was given hydralazine IV with improvement.  In addition, the patient was noted to have acute on chronic renal failure with serum creatinine up to 2.04.  This is above her usual baseline.  In addition, she was noted to have hypokalemia with a potassium 2.3 and hypomagnesemia with potassium  1.3.  WBC 12.9, hemoglobin 10.2, platelets 210,000.  Due to her electrolyte abnormalities and acute kidney injury, the patient was minute for further evaluation and treatment.  Since arrival to the hospital (nearly 20 hours now), the patient states that she has only had 1 bowel movement, but none has been documented by nursing.  She has not had any hematochezia or melena.   Assessment/Plan: Acute on chronic renal failure--CKD 3b -Baseline creatinine 1.5-1.7 -Presented with serum creatinine 2.04 -Secondary to volume depletion -Continue IV fluids -hold lasix  Acute Pancreatitis -multifactorial elevated triglycerides, semaglutide, and possible autoimmune -downgrade to clears as she continues to have significant abd pain with meal -continue IVF -triglycerides 597 -01/07/21 CT abd--new thickening and fat stranding about the pancreas, particularly the pancreatic tail;colon is fluid-filled, consistent with diarrheal illness; s/p chole and hyst  Hypokalemia -Secondary to GI loss -Improving with replacement  Hypomagnesemia -Replete  Diarrhea -pt claims 5 BMs since admission -If diarrhea should recur--> collect stool for C. difficile and stool pathogen panel -CRP 1.2 -ESR 77 -GI consult appreciated  Hypertension>>>Hypotension -The patient was given labetalol 300 mg at 2230, dilaudid at 2220, and hydralazine 10 mg IV at 1824 on 01/06/21 -This resulted in the patient developing hypotension -Fluid boluses have mitigated her low blood pressure -Continue IV fluids -Hold amlodipine -Decrease home dose of labetalol -3/13--hypotension improved  Diabetes mellitus type 2 -Interestingly, the patient was actually hypoglycemic in the ED in contrast to her reported "high" sugars on her glucometer at home -05/17/2020 hemoglobin A1c 6.9 -Decrease home Lantus -NovoLog sliding scale -hold semaglutide  Hyperlipidemia/Hypertriglyceridemia -continue statin -start  fenofibrate  Depression/Anxiety -continue Effexor       Status is: Inpatient  Remains inpatient appropriate because:IV treatments appropriate due to intensity of illness or inability to take PO   Dispo: The patient is from: Home  Anticipated d/c is to: Home  Patient currently is not medically stable to d/c.              Difficult to place patient No        Family Communication:  no Family at bedside  Consultants:  none  Code Status:  FULL   DVT Prophylaxis:  [place] Lovenox   Procedures: As Listed in Progress Note Above  Antibiotics: None     Subjective: Patient complains of upper abd pain.  She claims 5 loose BMs since yesterday.  Denies f/c, cp, n/v/, dysuria  Objective: Vitals:   01/07/21 0651 01/07/21 1444 01/07/21 2103 01/08/21 0550  BP: (!) 123/51 (!) 122/49 (!) 165/60 (!) 125/54  Pulse: 79 78 82 79  Resp: '20 20 20 20  ' Temp: 98.1 F (36.7 C) 97.9 F (36.6 C) 98.2 F (36.8 C) (!) 97.5 F (36.4 C)  TempSrc: Oral Oral Oral Oral  SpO2: 100% 100% 100% 100%  Weight:      Height:        Intake/Output Summary (Last 24 hours) at 01/08/2021 1336 Last data filed at 01/08/2021 0500 Gross per 24 hour  Intake 1131.61 ml  Output --  Net 1131.61 ml   Weight change:  Exam:   General:  Pt is alert, follows commands appropriately, not in acute distress  HEENT: No icterus, No thrush, No neck mass, [place]/AT  Cardiovascular: RRR, S1/S2, no rubs, no gallops  Respiratory: CTA bilaterally, no wheezing, no crackles, no rhonchi  Abdomen: Soft/+BS, epigastric  tender, non distended, no guarding  Extremities: No edema, No lymphangitis, No petechiae, No rashes, no synovitis   Data Reviewed: I have personally reviewed following labs and imaging studies Basic Metabolic Panel: Recent Labs  Lab 01/06/21 1403 01/07/21 0611 01/08/21 0524  NA 134* 136 139  K 2.3* 3.8 3.8  CL 94* 106 109  CO2 24 19* 16*  GLUCOSE  100* 289* 231*  BUN 28* 23 23  CREATININE 2.04* 1.80* 1.84*  CALCIUM 9.3 8.1* 8.7*  MG 1.3* 1.4*  --    Liver Function Tests: Recent Labs  Lab 01/06/21 1403 01/08/21 0524  AST 18 20  ALT 15 11  ALKPHOS 91 74  BILITOT 0.2* 0.1*  PROT 7.7 6.4*  ALBUMIN 3.7 3.2*   Recent Labs  Lab 01/07/21 1607  LIPASE 164*   No results for input(s): AMMONIA in the last 168 hours. Coagulation Profile: No results for input(s): INR, PROTIME in the last 168 hours. CBC: Recent Labs  Lab 01/06/21 1403 01/07/21 0611 01/08/21 0524  WBC 12.9* 9.6 8.3  NEUTROABS 8.7*  --   --   HGB 10.2* 8.4* 8.7*  HCT 30.3* 26.2* 28.6*  MCV 89.6 93.2 97.3  PLT 310 199 219   Cardiac Enzymes: No results for input(s): CKTOTAL, CKMB, CKMBINDEX, TROPONINI in the last 168 hours. BNP: Invalid input(s): POCBNP CBG: Recent Labs  Lab 01/06/21 1304 01/06/21 1323 01/06/21 2049  GLUCAP 66* 70 167*   HbA1C: Recent Labs    01/06/21 2105  HGBA1C 11.0*   Urine analysis:    Component Value Date/Time   COLORURINE YELLOW 03/02/2020 1420   APPEARANCEUR CLEAR 03/02/2020 1420   LABSPEC 1.012 03/02/2020 1420   PHURINE 5.0 03/02/2020 1420  GLUCOSEU NEGATIVE 03/02/2020 [place] 03/28/2018 1224   [place] 03/02/2020 1420   [place] 03/02/2020 1420   KETONESUR NEGATIVE 03/02/2020 1420   PROTEINUR 100 (A) 03/02/2020 1420   UROBILINOGEN 1.0 03/28/2018 1224   NITRITE NEGATIVE 03/02/2020 1420   LEUKOCYTESUR TRACE (A) 03/02/2020 1420   Sepsis Labs: '@LABRCNTIP' (procalcitonin:4,lacticidven:4) ) Recent Results (from the past 240 hour(s))  Resp Panel by RT-PCR (Flu A&B, Covid) Nasopharyngeal Swab     Status: None   Collection Time: 01/06/21  4:02 PM   Specimen: Nasopharyngeal Swab; Nasopharyngeal(NP) swabs in vial transport medium  Result Value Ref Range Status   SARS Coronavirus 2 by RT PCR NEGATIVE NEGATIVE Final    Comment: (NOTE) SARS-CoV-2 target nucleic acids are NOT  DETECTED.  The SARS-CoV-2 RNA is generally detectable in upper respiratory specimens during the acute phase of infection. The lowest concentration of SARS-CoV-2 viral copies this assay can detect is 138 copies/mL. A negative result does not preclude SARS-Cov-2 infection and should not be used as the sole basis for treatment or other patient management decisions. A negative result may occur with  improper specimen collection/handling, submission of specimen other than nasopharyngeal swab, presence of viral mutation(s) within the areas targeted by this assay, and inadequate number of viral copies(<138 copies/mL). A negative result must be combined with clinical observations, patient history, and epidemiological information. The expected result is Negative.  Fact Sheet for Patients:  EntrepreneurPulse.com.au  Fact Sheet for Healthcare Providers:  IncredibleEmployment.be  This test is no t yet approved or cleared by the [place] FDA and  has been authorized for detection and/or diagnosis of SARS-CoV-2 by FDA under an Emergency Use Authorization (EUA). This EUA will remain  in effect (meaning this test can be used) for the duration of the COVID-19 declaration under Section 564(b)(1) of the Act, 21 U.S.C.section 360bbb-3(b)(1), unless the authorization is terminated  or revoked sooner.       Influenza A by PCR NEGATIVE NEGATIVE Final   Influenza B by PCR NEGATIVE NEGATIVE Final    Comment: (NOTE) The Xpert Xpress SARS-CoV-2/FLU/RSV plus assay is intended as an aid in the diagnosis of influenza from Nasopharyngeal swab specimens and should not be used as a sole basis for treatment. Nasal washings and aspirates are unacceptable for Xpert Xpress SARS-CoV-2/FLU/RSV testing.  Fact Sheet for Patients: EntrepreneurPulse.com.au  Fact Sheet for Healthcare Providers: IncredibleEmployment.be  This test is not yet  approved or cleared by the [place] FDA and has been authorized for detection and/or diagnosis of SARS-CoV-2 by FDA under an Emergency Use Authorization (EUA). This EUA will remain in effect (meaning this test can be used) for the duration of the COVID-19 declaration under Section 564(b)(1) of the Act, 21 U.S.C. section 360bbb-3(b)(1), unless the authorization is terminated or revoked.  Performed at [facility], [address] [place], [place] [address]      Scheduled Meds: . acetaminophen  650 mg Oral Q6H  . atorvastatin  80 mg Oral q morning  . citalopram  40 mg Oral Daily  . dicyclomine  20 mg Oral TID AC  . enoxaparin (LOVENOX) injection  30 mg Subcutaneous Q24H  . feeding supplement  237 mL Oral BID BM  . gabapentin  300 mg Oral TID  . insulin aspart  0-15 Units Subcutaneous TID WC  . insulin aspart  0-5 Units Subcutaneous QHS  . insulin detemir  25 Units Subcutaneous QHS  . labetalol  100 mg Oral BID  . lidocaine  1  patch Transdermal Q24H  . lipase/protease/amylase  72,000 Units Oral TID WC  . pantoprazole  40 mg Oral Daily  . tiZANidine  8 mg Oral QHS  . venlafaxine XR  75 mg Oral Daily  . zolpidem  5 mg Oral QHS   Continuous Infusions: . lactated ringers 75 mL/hr at 01/08/21 1157    Procedures/Studies: CT ABDOMEN PELVIS WO CONTRAST  Result Date: 01/07/2021 CLINICAL DATA:  Abdominal pain, Crohn's disease with worsening pain, nausea, vomiting, diarrhea EXAM: CT ABDOMEN AND PELVIS WITHOUT CONTRAST TECHNIQUE: Multidetector CT imaging of the abdomen and pelvis was performed following the standard protocol without IV contrast. Oral enteric contrast was administered. COMPARISON:  12/22/2020 FINDINGS: Lower chest: No acute abnormality. Stable, benign small pulmonary nodules of the left lung base. Coronary artery calcifications. Hepatobiliary: No focal liver abnormality is seen. Status post cholecystectomy. No biliary dilatation. Pancreas: There is new thickening and fat  stranding about the pancreas, particularly the pancreatic tail (series 2, image 25). No pancreatic ductal dilatation. Spleen: Normal in size without significant abnormality. Adrenals/Urinary Tract: Adrenal glands are unremarkable. Kidneys are normal, without renal calculi, solid lesion, or hydronephrosis. Bladder is unremarkable. Stomach/Bowel: Stomach is within normal limits. Appendix is not clearly visualized and may be surgically absent. The colon is fluid-filled. No evidence of bowel wall thickening, distention, or inflammatory changes. Vascular/Lymphatic: Aortic atherosclerosis. No enlarged abdominal or pelvic lymph nodes. Reproductive: Status post hysterectomy. Other: Left lower quadrant hernia mesh repair. No abdominopelvic ascites. Musculoskeletal: No acute or significant osseous findings. IMPRESSION: 1. There is new thickening and fat stranding about the pancreas, particularly the pancreatic tail, consistent with acute pancreatitis. No evidence of acute pancreatic fluid collection on this noncontrast examination. 2. The colon is fluid-filled, consistent with diarrheal illness. No evidence of bowel obstruction. 3. Status post cholecystectomy, hysterectomy, and left lower quadrant hernia mesh repair. 4. Coronary artery disease. Aortic Atherosclerosis (ICD10-I70.0). Electronically Signed   By: Eddie Candle M.D.   On: 01/07/2021 15:48    Orson Eva, DO  [facility]  If 7PM-7AM, please contact night-coverage [email or web address] Password [email or web address] 01/08/2021, 1:36 PM   LOS: 2 days

## 2021-01-09 DIAGNOSIS — E876 Hypokalemia: Secondary | ICD-10-CM | POA: Diagnosis not present

## 2021-01-09 DIAGNOSIS — K859 Acute pancreatitis without necrosis or infection, unspecified: Secondary | ICD-10-CM

## 2021-01-09 DIAGNOSIS — N179 Acute kidney failure, unspecified: Secondary | ICD-10-CM | POA: Diagnosis not present

## 2021-01-09 DIAGNOSIS — K8500 Idiopathic acute pancreatitis without necrosis or infection: Secondary | ICD-10-CM | POA: Diagnosis not present

## 2021-01-09 DIAGNOSIS — A044 Other intestinal Escherichia coli infections: Secondary | ICD-10-CM

## 2021-01-09 DIAGNOSIS — K858 Other acute pancreatitis without necrosis or infection: Secondary | ICD-10-CM

## 2021-01-09 DIAGNOSIS — K50018 Crohn's disease of small intestine with other complication: Secondary | ICD-10-CM

## 2021-01-09 LAB — COMPREHENSIVE METABOLIC PANEL
ALT: 15 U/L (ref 0–44)
AST: 22 U/L (ref 15–41)
Albumin: 3 g/dL — ABNORMAL LOW (ref 3.5–5.0)
Alkaline Phosphatase: 68 U/L (ref 38–126)
Anion gap: 9 (ref 5–15)
BUN: 28 mg/dL — ABNORMAL HIGH (ref 8–23)
CO2: 15 mmol/L — ABNORMAL LOW (ref 22–32)
Calcium: 8.8 mg/dL — ABNORMAL LOW (ref 8.9–10.3)
Chloride: 107 mmol/L (ref 98–111)
Creatinine, Ser: 1.97 mg/dL — ABNORMAL HIGH (ref 0.44–1.00)
GFR, Estimated: 27 mL/min — ABNORMAL LOW (ref >60)
Glucose, Bld: 244 mg/dL — ABNORMAL HIGH (ref 70–99)
Potassium: 5.2 mmol/L — ABNORMAL HIGH (ref 3.5–5.1)
Sodium: 131 mmol/L — ABNORMAL LOW (ref 135–145)
Total Bilirubin: 0.3 mg/dL (ref 0.3–1.2)
Total Protein: 6.1 g/dL — ABNORMAL LOW (ref 6.5–8.1)

## 2021-01-09 LAB — CBC
HCT: 24.8 % — ABNORMAL LOW (ref 36.0–46.0)
Hemoglobin: 7.7 g/dL — ABNORMAL LOW (ref 12.0–15.0)
MCH: 30.8 pg (ref 26.0–34.0)
MCHC: 31 g/dL (ref 30.0–36.0)
MCV: 99.2 fL (ref 80.0–100.0)
Platelets: 158 10*3/uL (ref 150–400)
RBC: 2.5 MIL/uL — ABNORMAL LOW (ref 3.87–5.11)
RDW: 14.7 % (ref 11.5–15.5)
WBC: 9.4 10*3/uL (ref 4.0–10.5)
nRBC: 0 % (ref 0.0–0.2)

## 2021-01-09 LAB — IGG 4: IgG, Subclass 4: 3 mg/dL (ref 2–96)

## 2021-01-09 LAB — GLUCOSE, CAPILLARY
Glucose-Capillary: 227 mg/dL — ABNORMAL HIGH (ref 70–99)
Glucose-Capillary: 215 mg/dL — ABNORMAL HIGH (ref 70–99)
Glucose-Capillary: 268 mg/dL — ABNORMAL HIGH (ref 70–99)
Glucose-Capillary: 332 mg/dL — ABNORMAL HIGH (ref 70–99)
Glucose-Capillary: 191 mg/dL — ABNORMAL HIGH (ref 70–99)
Glucose-Capillary: 294 mg/dL — ABNORMAL HIGH (ref 70–99)
Glucose-Capillary: 297 mg/dL — ABNORMAL HIGH (ref 70–99)
Glucose-Capillary: 206 mg/dL — ABNORMAL HIGH (ref 70–99)
Glucose-Capillary: 220 mg/dL — ABNORMAL HIGH (ref 70–99)
Glucose-Capillary: 147 mg/dL — ABNORMAL HIGH (ref 70–99)
Glucose-Capillary: 83 mg/dL (ref 70–99)

## 2021-01-09 LAB — LIPASE, BLOOD: Lipase: 127 U/L — ABNORMAL HIGH (ref 11–51)

## 2021-01-09 MED ORDER — SODIUM ZIRCONIUM CYCLOSILICATE 10 G PO PACK
10.0000 g | PACK | Freq: Once | ORAL | Status: AC
  Administered 2021-01-09: 10 g via ORAL
  Filled 2021-01-09: qty 1

## 2021-01-09 MED ORDER — INSULIN ASPART 100 UNIT/ML ~~LOC~~ SOLN
3.0000 [IU] | Freq: Three times a day (TID) | SUBCUTANEOUS | Status: DC
  Administered 2021-01-09 – 2021-01-12 (×4): 3 [IU] via SUBCUTANEOUS

## 2021-01-09 MED ORDER — INSULIN DETEMIR 100 UNIT/ML ~~LOC~~ SOLN
27.0000 [IU] | Freq: Every day | SUBCUTANEOUS | Status: DC
  Administered 2021-01-10: 27 [IU] via SUBCUTANEOUS
  Filled 2021-01-09 (×3): qty 0.27

## 2021-01-09 NOTE — Discharge Instructions (Signed)
If you are interested in using FreeStyle Libre2 (continuious glucose monitoring sensor) please ask primary care provider about prescribing it for you.

## 2021-01-09 NOTE — Progress Notes (Signed)
Subjective: 2 soft stools today this morning. Formed stool yesterday. Abdomen doesn't feel as tense as it was. Abdominal pain improved. Dark specks in stool yesterday, unsure if blood. No bright red blood.  No N/V. Wants to advance diet. Currently on liquids.   Objective: Vital signs in last 24 hours: Temp:  [98 F (36.7 C)-98.3 F (36.8 C)] 98 F (36.7 C) (03/14 0540) Pulse Rate:  [83-94] 94 (03/14 0540) Resp:  [20] 20 (03/14 0540) BP: (125-150)/(45-56) 125/56 (03/14 0540) SpO2:  [99 %-100 %] 99 % (03/14 0540) Last BM Date: 01/08/21 General:   Alert and oriented, pleasant Head:  Normocephalic and atraumatic. Abdomen:  Bowel sounds present, soft, TTP epigastric without rebound or guarding Neurologic:  Alert and  oriented x4 Psych:  Alert and cooperative. Normal mood and affect.  Intake/Output from previous day: 03/13 0701 - 03/14 0700 In: 480.3 [P.O.:400; I.V.:80.3] Out: -  Intake/Output this shift: Total I/O In: 550 [P.O.:550] Out: -   Lab Results: Recent Labs    01/07/21 0611 01/08/21 0524 01/09/21 0900  WBC 9.6 8.3 9.4  HGB 8.4* 8.7* 7.7*  HCT 26.2* 28.6* 24.8*  PLT 199 219 158   BMET Recent Labs    01/07/21 0611 01/08/21 0524 01/09/21 0900  NA 136 139 131*  K 3.8 3.8 5.2*  CL 106 109 107  CO2 19* 16* 15*  GLUCOSE 289* 231* 244*  BUN 23 23 28*  CREATININE 1.80* 1.84* 1.97*  CALCIUM 8.1* 8.7* 8.8*   LFT Recent Labs    01/06/21 1403 01/08/21 0524 01/09/21 0900  PROT 7.7 6.4* 6.1*  ALBUMIN 3.7 3.2* 3.0*  AST 18 20 22   ALT 15 11 15   ALKPHOS 91 74 68  BILITOT 0.2* 0.1* 0.3     Studies/Results: CT ABDOMEN PELVIS WO CONTRAST  Result Date: 01/07/2021 CLINICAL DATA:  Abdominal pain, Crohn's disease with worsening pain, nausea, vomiting, diarrhea EXAM: CT ABDOMEN AND PELVIS WITHOUT CONTRAST TECHNIQUE: Multidetector CT imaging of the abdomen and pelvis was performed following the standard protocol without IV contrast. Oral enteric contrast was  administered. COMPARISON:  12/22/2020 FINDINGS: Lower chest: No acute abnormality. Stable, benign small pulmonary nodules of the left lung base. Coronary artery calcifications. Hepatobiliary: No focal liver abnormality is seen. Status post cholecystectomy. No biliary dilatation. Pancreas: There is new thickening and fat stranding about the pancreas, particularly the pancreatic tail (series 2, image 25). No pancreatic ductal dilatation. Spleen: Normal in size without significant abnormality. Adrenals/Urinary Tract: Adrenal glands are unremarkable. Kidneys are normal, without renal calculi, solid lesion, or hydronephrosis. Bladder is unremarkable. Stomach/Bowel: Stomach is within normal limits. Appendix is not clearly visualized and may be surgically absent. The colon is fluid-filled. No evidence of bowel wall thickening, distention, or inflammatory changes. Vascular/Lymphatic: Aortic atherosclerosis. No enlarged abdominal or pelvic lymph nodes. Reproductive: Status post hysterectomy. Other: Left lower quadrant hernia mesh repair. No abdominopelvic ascites. Musculoskeletal: No acute or significant osseous findings. IMPRESSION: 1. There is new thickening and fat stranding about the pancreas, particularly the pancreatic tail, consistent with acute pancreatitis. No evidence of acute pancreatic fluid collection on this noncontrast examination. 2. The colon is fluid-filled, consistent with diarrheal illness. No evidence of bowel obstruction. 3. Status post cholecystectomy, hysterectomy, and left lower quadrant hernia mesh repair. 4. Coronary artery disease. Aortic Atherosclerosis (ICD10-I70.0). Electronically Signed   By: Eddie Candle M.D.   On: 01/07/2021 15:48    Assessment: 70 year old female with history of small bowel Crohn's disease diagnosed Jan 2022 via capsule and  started on Humira Feb 2022, prior history of pancreatitis, admitted with severe hypertension and hypoglycemia, with GI consulted due to worsening  abdominal pain and diarrhea. CT abd without IV contrast on 3/12 with acute pancreatitis.  Acute pancreatitis: unclear etiology due to multiple contributing factors (drug effect with semaglutide, hypertriglyceridemia, possible autoimmune). Triglycerides >900 several months ago and 597 this admission. IgG 4 is in process. Initially, lipase was 164 and now 127 today. Clinically, she has had improvement in abdominal discomfort and desires to eat. Continue Creon 72,000 units with meals.   Diarrhea: recently diagnosed small bowel Crohn's disease and received 2 doses of Humira starting in Feb 2022. Long-standing history of diarrhea. CRP mildly elevated at 1.2 and sed rate elevated at 77. Avoiding steroids if at all possible due to diabetes. Unable to exclude flare but most likely due to GI pathogen panel with enteroaggregative E.coli (EAEC). Xifaxan started yesterday. Cdiff not included in GI pathogen panel. Semi-formed BM yesterday and 2 softer/watery today. Will hold off on Cdiff testing unless greater than 3 watery stools per day. Continue with Bentyl for supportive measures. Clinically, she has had some improvement.   Normocytic anemia: Hgb 7.7 today, down from 10.2 on admission. Previously in 13 range in Jan 2022. She is without overt GI bleeding.     Plan: Follow-up on pending IgG 4 Continue with IVFs, supportive measures Continue Creon with meals, add with snacks once diet advanced Trial of soft diet Complete course of Xifaxan, started yesterday Will need close follow-up with Dr. Carlean Purl as outpatient Holding off on steroids now as clinically improving  Follow H/H Will continue to follow with you   Annitta Needs, PhD, ANP-BC [facility]     LOS: 3 days    01/09/2021, 10:53 AM

## 2021-01-09 NOTE — Progress Notes (Addendum)
I took patient BS and it was 146. For some reason it will not transfer over in computer system.   MD notified of BS. Patient was scheduled 27 units of Levemir. Asked MD if this was still okay to give or if is should be held?   MD put in order around 0128 to give Levemir 15 units instead.

## 2021-01-09 NOTE — Progress Notes (Addendum)
Inpatient Diabetes Program Recommendations  AACE/ADA: New Consensus Statement on Inpatient Glycemic Control  Target Ranges:  Prepandial:   less than 140 mg/dL      Peak postprandial:   less than 180 mg/dL (1-2 hours)      Critically ill patients:  140 - 180 mg/dL   Results for Wiedeman, Aylissa A "[name]" (MRN [id number]) as of 01/09/2021 13:10  Ref. Range 01/08/2021 07:45 01/08/2021 11:01 01/08/2021 17:01 01/08/2021 21:00 01/09/2021 08:02 01/09/2021 11:21  Glucose-Capillary Latest Ref Range: 70 - 99 mg/dL 191 (H) 294 (H) 297 (H) 206 (H) 220 (H) 147 (H)  Results for Eblin, Joud A "[name]" (MRN [id number]) as of 01/09/2021 13:10  Ref. Range 05/17/2020 04:45 01/06/2021 21:05  Hemoglobin A1C Latest Ref Range: 4.8 - 5.6 % 6.9 (H) 11.0 (H)   Review of Glycemic Control  Diabetes history: DM2 Outpatient Diabetes medications: Levemir 80 units QHS, Humalog 20 units TID, Ozempic 2 mg Qweek (Sunday) Current orders for Inpatient glycemic control: Levemir 25 units QHS, Novolog 0-15 units TID with meals, Novolog 0-5 units QHS  Inpatient Diabetes Program Recommendations:    Insulin: Please consider increasing Levemir to 27 units QHS and increasing Novolog correction to 0-20 units TID with meals, and once patient is eating at least 50% of meals may want to consider ordering Novolog 3 units TID with meals for meal coverage if patient eats at least 50% of meals.  HbgA1C: A1C 11% on 01/06/21 indicating an average glucose of 269 mg/dl over the past 2-3 months.  NOTE: Spoke with patient over the phone about diabetes and home regimen for diabetes control. Patient reports being followed by PCP for diabetes management and currently taking Levemir 18-40 units QHS (if glucose is high (not able to provide clearly what is consider high) she takes 40 units; otherwise she takes 18 units), Humalog 21-23 units TID with meals, and Ozempic 2 mg Qweek (Monday) as an outpatient for diabetes control. Patient reports checking glucose 4-5  times per day and she reports that over the past few days her glucose has been reading HI on glucometer so she has been taking Lantus 40 units QHS at night.  Inquired about prior A1C and patient reports her last A1C was 7.4% (not sure how long ago).  Discussed A1C results (11% on 01/06/21) and explained that current A1C indicates an average glucose of 269 mg/dl over the past 2-3 months. Discussed glucose and A1C goals. Discussed importance of checking CBGs and maintaining good CBG control to prevent long-term and short-term complications.  Discussed impact of nutrition, exercise, stress, sickness, and medications on diabetes control. Patient reports that she usually follows a diabetic diet but she notes that over the past few days at home she was not eating much at all due to poor appetite. Encouraged patient to follow up with PCP regarding DM control. Patient noted that her fingers are so sore from her checking her glucose so much at home. Discussed FreeStyle Libre2 (CGM sensor) and patient states that she could be interested in using it and she plans to ask her PCP about prescribing it for her.  Patient verbalized understanding of information discussed and reports no further questions at this time related to diabetes. .   Thanks, Barnie Alderman, RN, MSN, CDE Diabetes Coordinator Inpatient Diabetes Program [phone] (Team Pager from 8am to 5pm)

## 2021-01-09 NOTE — TOC Initial Note (Signed)
Transition of Care [facility]) - Initial/Assessment Note    Patient Details  Name: NYOMI HOWSER MRN: [id number] Date of Birth: [birth year]  Transition of Care [facility]) CM/SW Contact:    Natasha Bence, LCSW Phone Number: 01/09/2021, 11:34 AM  Clinical Narrative:                 Patient is a 70 year old female admitted for Acute pancreatitis. CSW observed patient's high readmission score. CSW conducted readmission risk assesment. CSW also conducted initial assessment. Patient declined [facility] or SNF if recommended. Patient reported that she contacts her insurance directly if she needs any additional services. Patient reported that she able to complete her ADL's independently and does not use any DME equipment. Patient reported that she often experiences back pain and regularly sees her MD for it. TOC to follow.   Expected Discharge Plan: Home/Self Care Barriers to Discharge: Continued Medical Work up   Patient Goals and CMS Choice Patient states their goals for this hospitalization and ongoing recovery are:: Return home CMS Medicare.gov Compare Post Acute Care list provided to:: Patient Choice offered to / list presented to : NA  Expected Discharge Plan and Services Expected Discharge Plan: Home/Self Care In-house Referral: NA Discharge Planning Services: NA Post Acute Care Choice: NA                   DME Arranged: N/A DME Agency: NA       HH Arranged: NA HH Agency: NA        Prior Living Arrangements/Services   Lives with:: Self,Spouse Patient language and need for interpreter reviewed:: Yes Do you feel safe going back to the place where you live?: Yes      Need for Family Participation in Patient Care: Yes (Comment) Care giver support system in place?: Yes (comment)   Criminal Activity/Legal Involvement Pertinent to Current Situation/Hospitalization: No - Comment as needed  Activities of Daily Living Home Assistive Devices/Equipment: CBG Meter,Shower chair with back,Cane  (specify quad or straight),Walker (specify type) ADL Screening (condition at time of admission) Patient's cognitive ability adequate to safely complete daily activities?: Yes Is the patient deaf or have difficulty hearing?: No Does the patient have difficulty seeing, even when wearing glasses/contacts?: No Does the patient have difficulty concentrating, remembering, or making decisions?: No Patient able to express need for assistance with ADLs?: No Does the patient have difficulty dressing or bathing?: No Independently performs ADLs?: Yes (appropriate for developmental age) Does the patient have difficulty walking or climbing stairs?: No Weakness of Legs: Both Weakness of Arms/Hands: Both  Permission Sought/Granted Permission sought to share information with : Facility Art therapist granted to share information with : Yes, Verbal Permission Granted              Emotional Assessment     Affect (typically observed): Accepting,Adaptable Orientation: : Oriented to Self,Oriented to Situation,Oriented to Place,Oriented to  Time Alcohol / Substance Use: Not Applicable Psych Involvement: No (comment)  Admission diagnosis:  Hypokalemia [E87.6] Acute hypokalemia [E87.6] Diarrhea, unspecified type [R19.7] Nausea and vomiting, intractability of vomiting not specified, unspecified vomiting type [R11.2] Patient Active Problem List   Diagnosis Date Noted  . Acute pancreatitis 01/08/2021  . Acute renal failure superimposed on stage 3b chronic kidney disease ([place]) 01/07/2021  . Diarrhea   . Acute hypokalemia 01/06/2021  . Long-term use of immunosuppressant medication-anticipated 11/28/2020  . Headache 05/17/2020  . Hypokalemia 05/17/2020  . Hypomagnesemia 05/17/2020  . Dehydration 05/17/2020  . Hypertensive urgency  05/17/2020  . Diplopia 05/17/2020  . Left hip pain 09/22/2019  . Lumbar radiculopathy 09/22/2019  . Postoperative anemia due to acute blood loss  09/18/2019  . Postoperative urinary retention 09/18/2019  . Closed fracture of distal end of radius 05/26/2019  . Closed right hip fracture ([place]) 04/08/2018  . Elevated lipase   . Crohn's disease of small intestine with other complication ([place])   . Protein-calorie malnutrition, severe 03/05/2018  . Neck pain 03/12/2017  . Acute kidney injury superimposed on CKD ([place]) 01/25/2016  . Neuropathy 03/02/2015  . Pain in the chest   . Chest pain 12/22/2014  . Essential hypertension 12/22/2014  . Type 2 diabetes with nephropathy ([place]) 12/22/2014  . Hiatal hernia 12/22/2014  . GERD (gastroesophageal reflux disease) 12/22/2014  . Gout 12/22/2014  . Mixed hyperlipidemia 12/22/2014  . Depression 12/22/2014  . Type 2 diabetes mellitus without complications ([place]) 56/38/9373  . Fusion of spine, cervicothoracic region 11/01/2014  . Pseudophakia, left eye 10/20/2014  . Chronic kidney disease, stage III (moderate) ([place]) 08/25/2014  . Cervical stenosis of spine 08/26/2013  . Vitamin B12 deficiency 07/08/2011  . Personal history of failed moderate sedation 04/16/2011  . Chronic Nausea and Vomiting - ? gastroapresis 04/16/2011  . Early satiety 04/16/2011  . Irritable bowel syndrome 04/16/2011  . Benign paroxysmal positional vertigo 11/20/2010  . TRICUSPID REGURGITATION, MODERATE 11/20/2010  . PULMONARY HYPERTENSION, MILD 11/20/2010  . LEG EDEMA, BILATERAL 11/20/2010  . DYSPNEA 11/20/2010  . NONSPECIFIC ABNORMAL ELECTROCARDIOGRAM 11/20/2010  . CHEST WALL PAIN, HX OF 11/20/2010   PCP:  [name], MD Pharmacy:   [facility], [place] [address] [place] [address] Phone: [phone] Fax: [facility] [address], [place] [address] Phone: [phone] Fax: [phone]     Social Determinants of Health (SDOH) Interventions    Readmission Risk Interventions Readmission Risk Prevention  Plan 01/09/2021  Transportation Screening Complete  PCP or Specialist Appt within 3-5 Days Complete  HRI or [place] Complete  Social Work Consult for [place] Planning/Counseling Complete  Palliative Care Screening Not Applicable  Medication Review Press photographer) Complete  Some recent data might be hidden

## 2021-01-09 NOTE — Progress Notes (Signed)
PROGRESS NOTE  Heather [name] NMM:[id number] DOB: [birth year] DOA: 01/06/2021 PCP: [name], MD   Brief History: 70 year old female with a history of diabetes mellitus type 2, hyperlipidemia, Crohn's disease, CKD stage III, B12 deficiency, anxiety disorder presenting from her nephrology office secondary to elevated blood pressures. The patient is a difficult historian, but she is able to give generalities. The details of her history are somewhat conflicting. Nevertheless, it appears that the patient has been having nausea, vomiting, and diarrhea with worsening abdominal pain since 01/02/2021. She states that she began taking Lomotil, but it did not help much. There is no hematemesis, hematochezia or melena. Therefore, she quit taking it on 01/04/2021. She had denied any fevers, chills, headache, sore throat, coughing, hemoptysis, chest pain, shortness of breath, dysuria or hematuria. Apparently, she states that over the past 2 days she has noted some high readings on her glucometer at home. However,she says that she has had decreased oral intake since 01/02/2021. She feels that each time she eats she has increase of her loose stools. She denies any antibiotics or other new medications. Apparently, she went to see her usual nephrologist, Dr. Joline Maxcy 01/06/2021. She was noted to have elevated blood pressures in the office. As result, she was advised to go to the emergency department for further evaluation. In the emergency department, the patient was afebrile hemodynamically stable with oxygen saturation 98% on room air. She was noted to be hypertensive with blood pressure up to 218/85. She was given hydralazine IV with improvement. In addition, the patient was noted to have acute on chronic renal failure with serum creatinine up to 2.04.This is above her usual baseline. In addition, she was noted to have hypokalemia with a potassium 2.3 and hypomagnesemia with potassium  1.3. WBC 12.9, hemoglobin 10.2, platelets 210,000. Due to her electrolyte abnormalities and acute kidney injury, the patient was minute for further evaluation and treatment.  Since arrival to the hospital(nearly 20 hours now), the patient states that she has only had 1 bowel movement, but none has been documented by nursing. She has not had any hematochezia or melena.   Assessment/Plan: Acute on chronic renal failure--CKD 3b -Baseline creatinine 1.5-1.7 -Presented with serum creatinine 2.04 -Secondary to volume depletion -Continue IV fluids -hold lasix  Acute Pancreatitis -multifactorial elevated triglycerides, semaglutide, and possible autoimmune -downgrade to clears as she continues to have significant abd pain with meal -continue IVF -triglycerides 597 -01/07/21 CT abd--new thickening and fat stranding about the pancreas, particularly the pancreatic tail;colon is fluid-filled, consistent with diarrheal illness; s/p chole and hyst  Hypokalemia -Secondary to GI loss -Improving with replacement  Hypomagnesemia -Repleted  Diarrhea/E coli (EAEC) Enteritis -pt claims 3 BMs since yesterday -stool pathogen panel--E.coli -CRP 1.2 -ESR 77 -GI consult appreciated -due to intolerance or "allergy" to quinolones, start rifaximin  Hypertension>>>Hypotension -The patient was given labetalol 300 mgat 2230, dilaudid at 2220, and hydralazine 10 mg IV at 1824 on 01/06/21 -This resulted in the patient developing hypotension -Fluid boluses have mitigated her low blood pressure -Continue IV fluids -Hold amlodipine -Decrease home dose of labetalol -3/13--hypotension improved  Diabetes mellitus type 2 -Interestingly, the patient was actually hypoglycemic in the EDin contrast to her reported "high" sugars on her glucometer at home -01/06/21 A1C--11.0 -Continue Decreased home dose Lantus--increase to 27 units -add novolog 3 units with meals -NovoLog sliding scale -hold  semaglutide  Hyperlipidemia/Hypertriglyceridemia -continue statin -start fenofibrate  Depression/Anxiety -continue Effexor  Hyperkalemia -lokelma x 1  Status is: Inpatient  Remains inpatient appropriate because:IV treatments appropriate due to intensity of illness or inability to take PO   Dispo: The patient is from:Home Anticipated d/c is WE:RXVQ Patient currently is not medically stable to d/c. Difficult to place patient No        Family Communication: noFamily at bedside  Consultants:none  Code Status: FULL   DVT Prophylaxis: [place] Lovenox   Procedures: As Listed in Progress Note Above  Antibiotics: None     Subjective: Patient complains of abd pain--mod to severe--epigastric -denies f/c, cp, sob, n/v -diarrhea slowing down  Objective: Vitals:   01/08/21 0550 01/08/21 1459 01/08/21 2058 01/09/21 0540  BP: (!) 125/54 (!) 137/53 (!) 150/45 (!) 125/56  Pulse: 79 84 83 94  Resp: '20 20 20 20  ' Temp: (!) 97.5 F (36.4 C) 98.3 F (36.8 C) 98.2 F (36.8 C) 98 F (36.7 C)  TempSrc: Oral Oral Oral Oral  SpO2: 100% 100% 100% 99%  Weight:      Height:        Intake/Output Summary (Last 24 hours) at 01/09/2021 1246 Last data filed at 01/09/2021 0900 Gross per 24 hour  Intake 830.28 ml  Output --  Net 830.28 ml   Weight change:  Exam:   General:  Pt is alert, follows commands appropriately, not in acute distress  HEENT: No icterus, No thrush, No neck mass, [place]/AT  Cardiovascular: RRR, S1/S2, no rubs, no gallops  Respiratory: bibasilar rales. No wheeze  Abdomen: Soft/+BS, epigastric tender, non distended, no guarding  Extremities: No edema, No lymphangitis, No petechiae, No rashes, no synovitis   Data Reviewed: I have personally reviewed following labs and imaging studies Basic Metabolic Panel: Recent Labs  Lab 01/06/21 1403 01/07/21 0611 01/08/21 0524  01/09/21 0900  NA 134* 136 139 131*  K 2.3* 3.8 3.8 5.2*  CL 94* 106 109 107  CO2 24 19* 16* 15*  GLUCOSE 100* 289* 231* 244*  BUN 28* 23 23 28*  CREATININE 2.04* 1.80* 1.84* 1.97*  CALCIUM 9.3 8.1* 8.7* 8.8*  MG 1.3* 1.4*  --   --    Liver Function Tests: Recent Labs  Lab 01/06/21 1403 01/08/21 0524 01/09/21 0900  AST '18 20 22  ' ALT '15 11 15  ' ALKPHOS 91 74 68  BILITOT 0.2* 0.1* 0.3  PROT 7.7 6.4* 6.1*  ALBUMIN 3.7 3.2* 3.0*   Recent Labs  Lab 01/07/21 1607 01/09/21 0900  LIPASE 164* 127*   No results for input(s): AMMONIA in the last 168 hours. Coagulation Profile: No results for input(s): INR, PROTIME in the last 168 hours. CBC: Recent Labs  Lab 01/06/21 1403 01/07/21 0611 01/08/21 0524 01/09/21 0900  WBC 12.9* 9.6 8.3 9.4  NEUTROABS 8.7*  --   --   --   HGB 10.2* 8.4* 8.7* 7.7*  HCT 30.3* 26.2* 28.6* 24.8*  MCV 89.6 93.2 97.3 99.2  PLT 310 199 219 158   Cardiac Enzymes: No results for input(s): CKTOTAL, CKMB, CKMBINDEX, TROPONINI in the last 168 hours. BNP: Invalid input(s): POCBNP CBG: Recent Labs  Lab 01/08/21 1101 01/08/21 1701 01/08/21 2100 01/09/21 0802 01/09/21 1121  GLUCAP 294* 297* 206* 220* 147*   HbA1C: Recent Labs    01/06/21 2105  HGBA1C 11.0*   Urine analysis:    Component Value Date/Time   COLORURINE YELLOW 03/02/2020 1420   APPEARANCEUR CLEAR 03/02/2020 1420   LABSPEC 1.012 03/02/2020 1420   PHURINE 5.0 03/02/2020 1420   GLUCOSEU NEGATIVE 03/02/2020 1420   GLUCOSEU  NEGATIVE 03/28/2018 [place] 03/02/2020 1420   [place] 03/02/2020 1420   KETONESUR NEGATIVE 03/02/2020 1420   PROTEINUR 100 (A) 03/02/2020 1420   UROBILINOGEN 1.0 03/28/2018 1224   NITRITE NEGATIVE 03/02/2020 1420   LEUKOCYTESUR TRACE (A) 03/02/2020 1420   Sepsis Labs: '@LABRCNTIP' (procalcitonin:4,lacticidven:4) ) Recent Results (from the past 240 hour(s))  Resp Panel by RT-PCR (Flu A&B, Covid) Nasopharyngeal Swab     Status:  None   Collection Time: 01/06/21  4:02 PM   Specimen: Nasopharyngeal Swab; Nasopharyngeal(NP) swabs in vial transport medium  Result Value Ref Range Status   SARS Coronavirus 2 by RT PCR NEGATIVE NEGATIVE Final    Comment: (NOTE) SARS-CoV-2 target nucleic acids are NOT DETECTED.  The SARS-CoV-2 RNA is generally detectable in upper respiratory specimens during the acute phase of infection. The lowest concentration of SARS-CoV-2 viral copies this assay can detect is 138 copies/mL. A negative result does not preclude SARS-Cov-2 infection and should not be used as the sole basis for treatment or other patient management decisions. A negative result may occur with  improper specimen collection/handling, submission of specimen other than nasopharyngeal swab, presence of viral mutation(s) within the areas targeted by this assay, and inadequate number of viral copies(<138 copies/mL). A negative result must be combined with clinical observations, patient history, and epidemiological information. The expected result is Negative.  Fact Sheet for Patients:  EntrepreneurPulse.com.au  Fact Sheet for Healthcare Providers:  IncredibleEmployment.be  This test is no t yet approved or cleared by the [place] FDA and  has been authorized for detection and/or diagnosis of SARS-CoV-2 by FDA under an Emergency Use Authorization (EUA). This EUA will remain  in effect (meaning this test can be used) for the duration of the COVID-19 declaration under Section 564(b)(1) of the Act, 21 U.S.C.section 360bbb-3(b)(1), unless the authorization is terminated  or revoked sooner.       Influenza A by PCR NEGATIVE NEGATIVE Final   Influenza B by PCR NEGATIVE NEGATIVE Final    Comment: (NOTE) The Xpert Xpress SARS-CoV-2/FLU/RSV plus assay is intended as an aid in the diagnosis of influenza from Nasopharyngeal swab specimens and should not be used as a sole basis for  treatment. Nasal washings and aspirates are unacceptable for Xpert Xpress SARS-CoV-2/FLU/RSV testing.  Fact Sheet for Patients: EntrepreneurPulse.com.au  Fact Sheet for Healthcare Providers: IncredibleEmployment.be  This test is not yet approved or cleared by the [place] FDA and has been authorized for detection and/or diagnosis of SARS-CoV-2 by FDA under an Emergency Use Authorization (EUA). This EUA will remain in effect (meaning this test can be used) for the duration of the COVID-19 declaration under Section 564(b)(1) of the Act, 21 U.S.C. section 360bbb-3(b)(1), unless the authorization is terminated or revoked.  Performed at [facility], [address], [place], [place] [address]   Gastrointestinal Panel by PCR , Stool     Status: Abnormal   Collection Time: 01/07/21  2:50 PM   Specimen: STOOL  Result Value Ref Range Status   Campylobacter species NOT DETECTED NOT DETECTED Final   Plesimonas shigelloides NOT DETECTED NOT DETECTED Final   Salmonella species NOT DETECTED NOT DETECTED Final   Yersinia enterocolitica NOT DETECTED NOT DETECTED Final   Vibrio species NOT DETECTED NOT DETECTED Final   Vibrio cholerae NOT DETECTED NOT DETECTED Final   Enteroaggregative E coli (EAEC) DETECTED (A) NOT DETECTED Final    Comment: RESULT CALLED TO, READ BACK BY AND VERIFIED WITH: LAUREN COLEMAN 01/08/21 AT 1555 BY  ACR    Enteropathogenic E coli (EPEC) NOT DETECTED NOT DETECTED Final   Enterotoxigenic E coli (ETEC) NOT DETECTED NOT DETECTED Final   Shiga like toxin producing E coli (STEC) NOT DETECTED NOT DETECTED Final   Shigella/Enteroinvasive E coli (EIEC) NOT DETECTED NOT DETECTED Final   Cryptosporidium NOT DETECTED NOT DETECTED Final   Cyclospora cayetanensis NOT DETECTED NOT DETECTED Final   Entamoeba histolytica NOT DETECTED NOT DETECTED Final   Giardia lamblia NOT DETECTED NOT DETECTED Final   Adenovirus F40/41 NOT DETECTED NOT  DETECTED Final   Astrovirus NOT DETECTED NOT DETECTED Final   Norovirus GI/GII NOT DETECTED NOT DETECTED Final   Rotavirus A NOT DETECTED NOT DETECTED Final   Sapovirus (I, II, IV, and V) NOT DETECTED NOT DETECTED Final    Comment: Performed at [facility], [address], [place], [place] [address]     Scheduled Meds: . (feeding supplement) PROSource Plus  30 mL Oral BID BM  . acetaminophen  650 mg Oral Q6H  . atorvastatin  80 mg Oral q morning  . citalopram  40 mg Oral Daily  . dicyclomine  20 mg Oral TID AC  . enoxaparin (LOVENOX) injection  30 mg Subcutaneous Q24H  . feeding supplement  1 Container Oral TID BM  . fenofibrate  160 mg Oral Daily  . gabapentin  300 mg Oral TID  . insulin aspart  0-15 Units Subcutaneous TID WC  . insulin aspart  0-5 Units Subcutaneous QHS  . insulin detemir  25 Units Subcutaneous QHS  . labetalol  100 mg Oral BID  . lidocaine  1 patch Transdermal Q24H  . lipase/protease/amylase  72,000 Units Oral TID WC  . pantoprazole  40 mg Oral Daily  . rifaximin  200 mg Oral TID  . tiZANidine  8 mg Oral QHS  . venlafaxine XR  75 mg Oral Daily  . zolpidem  5 mg Oral QHS   Continuous Infusions: . lactated ringers 75 mL/hr at 01/09/21 0310    Procedures/Studies: CT ABDOMEN PELVIS WO CONTRAST  Result Date: 01/07/2021 CLINICAL DATA:  Abdominal pain, Crohn's disease with worsening pain, nausea, vomiting, diarrhea EXAM: CT ABDOMEN AND PELVIS WITHOUT CONTRAST TECHNIQUE: Multidetector CT imaging of the abdomen and pelvis was performed following the standard protocol without IV contrast. Oral enteric contrast was administered. COMPARISON:  12/22/2020 FINDINGS: Lower chest: No acute abnormality. Stable, benign small pulmonary nodules of the left lung base. Coronary artery calcifications. Hepatobiliary: No focal liver abnormality is seen. Status post cholecystectomy. No biliary dilatation. Pancreas: There is new thickening and fat stranding about the pancreas,  particularly the pancreatic tail (series 2, image 25). No pancreatic ductal dilatation. Spleen: Normal in size without significant abnormality. Adrenals/Urinary Tract: Adrenal glands are unremarkable. Kidneys are normal, without renal calculi, solid lesion, or hydronephrosis. Bladder is unremarkable. Stomach/Bowel: Stomach is within normal limits. Appendix is not clearly visualized and may be surgically absent. The colon is fluid-filled. No evidence of bowel wall thickening, distention, or inflammatory changes. Vascular/Lymphatic: Aortic atherosclerosis. No enlarged abdominal or pelvic lymph nodes. Reproductive: Status post hysterectomy. Other: Left lower quadrant hernia mesh repair. No abdominopelvic ascites. Musculoskeletal: No acute or significant osseous findings. IMPRESSION: 1. There is new thickening and fat stranding about the pancreas, particularly the pancreatic tail, consistent with acute pancreatitis. No evidence of acute pancreatic fluid collection on this noncontrast examination. 2. The colon is fluid-filled, consistent with diarrheal illness. No evidence of bowel obstruction. 3. Status post cholecystectomy, hysterectomy, and left lower quadrant hernia mesh  repair. 4. Coronary artery disease. Aortic Atherosclerosis (ICD10-I70.0). Electronically Signed   By: Eddie Candle M.D.   On: 01/07/2021 15:48    Orson Eva, DO  [facility]  If 7PM-7AM, please contact night-coverage [email or web address] Password [email or web address] 01/09/2021, 12:46 PM   LOS: 3 days

## 2021-01-10 ENCOUNTER — Inpatient Hospital Stay (HOSPITAL_COMMUNITY): Payer: 59

## 2021-01-10 DIAGNOSIS — A044 Other intestinal Escherichia coli infections: Secondary | ICD-10-CM | POA: Diagnosis not present

## 2021-01-10 DIAGNOSIS — N183 Chronic kidney disease, stage 3 unspecified: Secondary | ICD-10-CM | POA: Diagnosis not present

## 2021-01-10 DIAGNOSIS — D649 Anemia, unspecified: Secondary | ICD-10-CM

## 2021-01-10 DIAGNOSIS — K8500 Idiopathic acute pancreatitis without necrosis or infection: Secondary | ICD-10-CM | POA: Diagnosis not present

## 2021-01-10 DIAGNOSIS — K50018 Crohn's disease of small intestine with other complication: Secondary | ICD-10-CM | POA: Diagnosis not present

## 2021-01-10 DIAGNOSIS — D539 Nutritional anemia, unspecified: Secondary | ICD-10-CM

## 2021-01-10 LAB — GLUCOSE, CAPILLARY
Glucose-Capillary: 169 mg/dL — ABNORMAL HIGH (ref 70–99)
Glucose-Capillary: 146 mg/dL — ABNORMAL HIGH (ref 70–99)
Glucose-Capillary: 154 mg/dL — ABNORMAL HIGH (ref 70–99)
Glucose-Capillary: 239 mg/dL — ABNORMAL HIGH (ref 70–99)
Glucose-Capillary: 223 mg/dL — ABNORMAL HIGH (ref 70–99)
Glucose-Capillary: 242 mg/dL — ABNORMAL HIGH (ref 70–99)
Glucose-Capillary: 167 mg/dL — ABNORMAL HIGH (ref 70–99)

## 2021-01-10 LAB — COMPREHENSIVE METABOLIC PANEL
ALT: 15 U/L (ref 0–44)
AST: 22 U/L (ref 15–41)
Albumin: 2.7 g/dL — ABNORMAL LOW (ref 3.5–5.0)
Alkaline Phosphatase: 62 U/L (ref 38–126)
Anion gap: 9 (ref 5–15)
BUN: 32 mg/dL — ABNORMAL HIGH (ref 8–23)
CO2: 18 mmol/L — ABNORMAL LOW (ref 22–32)
Calcium: 9 mg/dL (ref 8.9–10.3)
Chloride: 110 mmol/L (ref 98–111)
Creatinine, Ser: 1.75 mg/dL — ABNORMAL HIGH (ref 0.44–1.00)
GFR, Estimated: 31 mL/min — ABNORMAL LOW (ref >60)
Glucose, Bld: 250 mg/dL — ABNORMAL HIGH (ref 70–99)
Potassium: 5.4 mmol/L — ABNORMAL HIGH (ref 3.5–5.1)
Sodium: 137 mmol/L (ref 135–145)
Total Bilirubin: 0.4 mg/dL (ref 0.3–1.2)
Total Protein: 5.6 g/dL — ABNORMAL LOW (ref 6.5–8.1)

## 2021-01-10 LAB — CBC
HCT: 22.3 % — ABNORMAL LOW (ref 36.0–46.0)
Hemoglobin: 7.2 g/dL — ABNORMAL LOW (ref 12.0–15.0)
MCH: 31 pg (ref 26.0–34.0)
MCHC: 32.3 g/dL (ref 30.0–36.0)
MCV: 96.1 fL (ref 80.0–100.0)
Platelets: 167 10*3/uL (ref 150–400)
RBC: 2.32 MIL/uL — ABNORMAL LOW (ref 3.87–5.11)
RDW: 15 % (ref 11.5–15.5)
WBC: 8.1 10*3/uL (ref 4.0–10.5)
nRBC: 0 % (ref 0.0–0.2)

## 2021-01-10 LAB — LIPASE, BLOOD: Lipase: 87 U/L — ABNORMAL HIGH (ref 11–51)

## 2021-01-10 MED ORDER — CITALOPRAM HYDROBROMIDE 20 MG PO TABS
20.0000 mg | ORAL_TABLET | Freq: Every day | ORAL | Status: DC
  Administered 2021-01-11 – 2021-01-12 (×2): 20 mg via ORAL
  Filled 2021-01-10: qty 1

## 2021-01-10 MED ORDER — SODIUM ZIRCONIUM CYCLOSILICATE 10 G PO PACK
10.0000 g | PACK | Freq: Once | ORAL | Status: AC
  Administered 2021-01-10: 10 g via ORAL
  Filled 2021-01-10: qty 1

## 2021-01-10 MED ORDER — INSULIN DETEMIR 100 UNIT/ML ~~LOC~~ SOLN
15.0000 [IU] | Freq: Once | SUBCUTANEOUS | Status: AC
  Administered 2021-01-10: 15 [IU] via SUBCUTANEOUS
  Filled 2021-01-10: qty 0.15

## 2021-01-10 MED ORDER — INSULIN DETEMIR 100 UNIT/ML ~~LOC~~ SOLN
12.0000 [IU] | Freq: Once | SUBCUTANEOUS | Status: AC
  Administered 2021-01-10: 12 [IU] via SUBCUTANEOUS
  Filled 2021-01-10: qty 0.12

## 2021-01-10 NOTE — Progress Notes (Signed)
PROGRESS NOTE  Heather [name] KZS:[id number] DOB: April 29, 1951 DOA: 01/06/2021 PCP: [name], MD  Brief History: 70 year old female with a history of diabetes mellitus type 2, hyperlipidemia, Crohn's disease, CKD stage III, B12 deficiency, anxiety disorder presenting from her nephrology office secondary to elevated blood pressures. The patient is a difficult historian, but she is able to give generalities. The details of her history are somewhat conflicting. Nevertheless, it appears that the patient has been having nausea, vomiting, and diarrhea with worsening abdominal pain since 01/02/2021. She states that she began taking Lomotil, but it did not help much. There is no hematemesis, hematochezia or melena. Therefore, she quit taking it on 01/04/2021. She had denied any fevers, chills, headache, sore throat, coughing, hemoptysis, chest pain, shortness of breath, dysuria or hematuria. Apparently, she states that over the past 2 days she has noted some high readings on her glucometer at home. However,she says that she has had decreased oral intake since 01/02/2021. She feels that each time she eats she has increase of her loose stools. She denies any antibiotics or other new medications. Apparently, she went to see her usual nephrologist, Dr. Joline Maxcy 01/06/2021. She was noted to have elevated blood pressures in the office. As result, she was advised to go to the emergency department for further evaluation. In the emergency department, the patient was afebrile hemodynamically stable with oxygen saturation 98% on room air. She was noted to be hypertensive with blood pressure up to 218/85. She was given hydralazine IV with improvement. In addition, the patient was noted to have acute on chronic renal failure with serum creatinine up to 2.04.This is above her usual baseline. In addition, she was noted to have hypokalemia with a potassium 2.3 and hypomagnesemia with potassium 1.3.  WBC 12.9, hemoglobin 10.2, platelets 210,000. Due to her electrolyte abnormalities and acute kidney injury, the patient was minute for further evaluation and treatment.  Since arrival to the hospital(nearly 20 hours now), the patient states that she has only had 1 bowel movement, but none has been documented by nursing. She has not had any hematochezia or melena.   Assessment/Plan: Acute on chronic renal failure--CKD 3b -Baseline creatinine 1.5-1.7 -Presented with serum creatinine 2.04 -Secondary to volume depletion -Continue IV fluids -hold lasix  Acute Pancreatitis -multifactorial elevated triglycerides, semaglutide, and possible autoimmune -3/15 downgrade to clears as she continues to have abd pain with meal -overall abd pain is improving -continue IVF -triglycerides 597 -01/07/21 CT abd--new thickening and fat stranding about the pancreas, particularly the pancreatic tail;colon is fluid-filled, consistent with diarrheal illness; s/p chole and hyst  Hypokalemia -Secondary to GI loss -Improving with replacement  Hypomagnesemia -Repleted  Diarrhea/E coli (EAEC) Enteritis -pt claims 3 BMs since yesterday (down from 10-12 per day) -stool pathogen panel--E.coli -CRP 1.2 -ESR 77 -GI consult appreciated -due to intolerance or "allergy" to quinolones, started rifaximin  Hypertension>>>Hypotension -The patient was given labetalol 300 mgat 2230, dilaudid at 2220, and hydralazine 10 mg IV at 1824 on 01/06/21 -This resulted in the patient developing hypotension -Fluid boluses have mitigated her low blood pressure -Continue IV fluids -Hold amlodipine -Decreased home dose of labetalol -3/13--hypotension improved  Diabetes mellitus type 2 -Interestingly, the patient was actually hypoglycemic in the EDin contrast to her reported "high" sugars on her glucometer at home -01/06/21 A1C--11.0 -Continue Decreased home dose Lantus--increase to 27 units -add novolog 3 units  with meals -NovoLog sliding scale -holdsemaglutide  Hyperlipidemia/Hypertriglyceridemia -continue statin -started fenofibrate  Depression/Anxiety -continue Effexor  Hyperkalemia -lokelma x 2 (3/14 and 3/15)  Dyspnea/cough -stable on RA--99 to 100% -order CXR      Status is: Inpatient  Remains inpatient appropriate because:IV treatments appropriate due to intensity of illness or inability to take PO   Dispo: The patient is from:Home Anticipated d/c is QJ:JHER Patient currently is not medically stable to d/c. Difficult to place patient No        Family Communication:noFamily at bedside  Consultants:none  Code Status: FULL   DVT Prophylaxis: [place] Lovenox   Procedures: As Listed in Progress Note Above  Antibiotics: None   Subjective: Patient complains of some dyspnea on exertion and dry cough.  3 loose BMs in last 24 hours.  No n/v.  No chest pain, f/c  Objective: Vitals:   01/09/21 2247 01/10/21 0520 01/10/21 0802 01/10/21 1412  BP: (!) 148/54 119/60 (!) 174/66 (!) 126/102  Pulse: 96 (!) 108 (!) 108 95  Resp:  19  18  Temp:  98.4 F (36.9 C)  98.6 F (37 C)  TempSrc:  Oral  Oral  SpO2:  99%  99%  Weight:      Height:        Intake/Output Summary (Last 24 hours) at 01/10/2021 1630 Last data filed at 01/09/2021 1700 Gross per 24 hour  Intake 320 ml  Output --  Net 320 ml   Weight change:  Exam:   General:  Pt is alert, follows commands appropriately, not in acute distress  HEENT: No icterus, No thrush, No neck mass, [place]/AT  Cardiovascular: RRR, S1/S2, no rubs, no gallops  Respiratory: bibasilar rales.  Bibasilar wheeze  Abdomen: Soft/+BS, epigastric tender, non distended, no guarding  Extremities: No edema, No lymphangitis, No petechiae, No rashes, no synovitis   Data Reviewed: I have personally reviewed following labs and imaging studies Basic Metabolic  Panel: Recent Labs  Lab 01/06/21 1403 01/07/21 0611 01/08/21 0524 01/09/21 0900 01/10/21 0632  NA 134* 136 139 131* 137  K 2.3* 3.8 3.8 5.2* 5.4*  CL 94* 106 109 107 110  CO2 24 19* 16* 15* 18*  GLUCOSE 100* 289* 231* 244* 250*  BUN 28* 23 23 28* 32*  CREATININE 2.04* 1.80* 1.84* 1.97* 1.75*  CALCIUM 9.3 8.1* 8.7* 8.8* 9.0  MG 1.3* 1.4*  --   --   --    Liver Function Tests: Recent Labs  Lab 01/06/21 1403 01/08/21 0524 01/09/21 0900 01/10/21 0632  AST '18 20 22 22  ' ALT '15 11 15 15  ' ALKPHOS 91 74 68 62  BILITOT 0.2* 0.1* 0.3 0.4  PROT 7.7 6.4* 6.1* 5.6*  ALBUMIN 3.7 3.2* 3.0* 2.7*   Recent Labs  Lab 01/07/21 1607 01/09/21 0900 01/10/21 0632  LIPASE 164* 127* 87*   No results for input(s): AMMONIA in the last 168 hours. Coagulation Profile: No results for input(s): INR, PROTIME in the last 168 hours. CBC: Recent Labs  Lab 01/06/21 1403 01/07/21 0611 01/08/21 0524 01/09/21 0900 01/10/21 0632  WBC 12.9* 9.6 8.3 9.4 8.1  NEUTROABS 8.7*  --   --   --   --   HGB 10.2* 8.4* 8.7* 7.7* 7.2*  HCT 30.3* 26.2* 28.6* 24.8* 22.3*  MCV 89.6 93.2 97.3 99.2 96.1  PLT 310 199 219 158 167   Cardiac Enzymes: No results for input(s): CKTOTAL, CKMB, CKMBINDEX, TROPONINI in the last 168 hours. BNP: Invalid input(s): POCBNP CBG: Recent Labs  Lab 01/10/21 0107 01/10/21 0521 01/10/21 7408 01/10/21 0729 01/10/21 1116  GLUCAP 154* 239* 242* 223* 167*   HbA1C: No results for input(s): HGBA1C in the last 72 hours. Urine analysis:    Component Value Date/Time   COLORURINE YELLOW 03/02/2020 1420   APPEARANCEUR CLEAR 03/02/2020 1420   LABSPEC 1.012 03/02/2020 1420   PHURINE 5.0 03/02/2020 1420   GLUCOSEU NEGATIVE 03/02/2020 1420   GLUCOSEU NEGATIVE 03/28/2018 1224   HGBUR NEGATIVE 03/02/2020 1420   BILIRUBINUR NEGATIVE 03/02/2020 1420   KETONESUR NEGATIVE 03/02/2020 1420   PROTEINUR 100 (A) 03/02/2020 1420   UROBILINOGEN 1.0 03/28/2018 1224   NITRITE NEGATIVE  03/02/2020 1420   LEUKOCYTESUR TRACE (A) 03/02/2020 1420   Sepsis Labs: '@LABRCNTIP' (procalcitonin:4,lacticidven:4) ) Recent Results (from the past 240 hour(s))  Resp Panel by RT-PCR (Flu A&B, Covid) Nasopharyngeal Swab     Status: None   Collection Time: 01/06/21  4:02 PM   Specimen: Nasopharyngeal Swab; Nasopharyngeal(NP) swabs in vial transport medium  Result Value Ref Range Status   SARS Coronavirus 2 by RT PCR NEGATIVE NEGATIVE Final    Comment: (NOTE) SARS-CoV-2 target nucleic acids are NOT DETECTED.  The SARS-CoV-2 RNA is generally detectable in upper respiratory specimens during the acute phase of infection. The lowest concentration of SARS-CoV-2 viral copies this assay can detect is 138 copies/mL. A negative result does not preclude SARS-Cov-2 infection and should not be used as the sole basis for treatment or other patient management decisions. A negative result may occur with  improper specimen collection/handling, submission of specimen other than nasopharyngeal swab, presence of viral mutation(s) within the areas targeted by this assay, and inadequate number of viral copies(<138 copies/mL). A negative result must be combined with clinical observations, patient history, and epidemiological information. The expected result is Negative.  Fact Sheet for Patients:  EntrepreneurPulse.com.au  Fact Sheet for Healthcare Providers:  IncredibleEmployment.be  This test is no t yet approved or cleared by the [place] FDA and  has been authorized for detection and/or diagnosis of SARS-CoV-2 by FDA under an Emergency Use Authorization (EUA). This EUA will remain  in effect (meaning this test can be used) for the duration of the COVID-19 declaration under Section 564(b)(1) of the Act, 21 U.S.C.section 360bbb-3(b)(1), unless the authorization is terminated  or revoked sooner.       Influenza A by PCR NEGATIVE NEGATIVE Final   Influenza B  by PCR NEGATIVE NEGATIVE Final    Comment: (NOTE) The Xpert Xpress SARS-CoV-2/FLU/RSV plus assay is intended as an aid in the diagnosis of influenza from Nasopharyngeal swab specimens and should not be used as a sole basis for treatment. Nasal washings and aspirates are unacceptable for Xpert Xpress SARS-CoV-2/FLU/RSV testing.  Fact Sheet for Patients: EntrepreneurPulse.com.au  Fact Sheet for Healthcare Providers: IncredibleEmployment.be  This test is not yet approved or cleared by the [place] FDA and has been authorized for detection and/or diagnosis of SARS-CoV-2 by FDA under an Emergency Use Authorization (EUA). This EUA will remain in effect (meaning this test can be used) for the duration of the COVID-19 declaration under Section 564(b)(1) of the Act, 21 U.S.C. section 360bbb-3(b)(1), unless the authorization is terminated or revoked.  Performed at [facility], [address], [place],  [address]   Gastrointestinal Panel by PCR , Stool     Status: Abnormal   Collection Time: 01/07/21  2:50 PM   Specimen: STOOL  Result Value Ref Range Status   Campylobacter species NOT DETECTED NOT DETECTED Final   Plesimonas shigelloides NOT DETECTED NOT DETECTED Final   Salmonella species NOT DETECTED  NOT DETECTED Final   Yersinia enterocolitica NOT DETECTED NOT DETECTED Final   Vibrio species NOT DETECTED NOT DETECTED Final   Vibrio cholerae NOT DETECTED NOT DETECTED Final   Enteroaggregative E coli (EAEC) DETECTED (A) NOT DETECTED Final    Comment: RESULT CALLED TO, READ BACK BY AND VERIFIED WITH: LAUREN COLEMAN 01/08/21 AT 1555 BY ACR    Enteropathogenic E coli (EPEC) NOT DETECTED NOT DETECTED Final   Enterotoxigenic E coli (ETEC) NOT DETECTED NOT DETECTED Final   Shiga like toxin producing E coli (STEC) NOT DETECTED NOT DETECTED Final   Shigella/Enteroinvasive E coli (EIEC) NOT DETECTED NOT DETECTED Final   Cryptosporidium NOT DETECTED  NOT DETECTED Final   Cyclospora cayetanensis NOT DETECTED NOT DETECTED Final   Entamoeba histolytica NOT DETECTED NOT DETECTED Final   Giardia lamblia NOT DETECTED NOT DETECTED Final   Adenovirus F40/41 NOT DETECTED NOT DETECTED Final   Astrovirus NOT DETECTED NOT DETECTED Final   Norovirus GI/GII NOT DETECTED NOT DETECTED Final   Rotavirus A NOT DETECTED NOT DETECTED Final   Sapovirus (I, II, IV, and V) NOT DETECTED NOT DETECTED Final    Comment: Performed at [facility], [address], [place], [place] [address]     Scheduled Meds: . (feeding supplement) PROSource Plus  30 mL Oral BID BM  . acetaminophen  650 mg Oral Q6H  . atorvastatin  80 mg Oral q morning  . citalopram  40 mg Oral Daily  . dicyclomine  20 mg Oral TID AC  . enoxaparin (LOVENOX) injection  30 mg Subcutaneous Q24H  . feeding supplement  1 Container Oral TID BM  . fenofibrate  160 mg Oral Daily  . gabapentin  300 mg Oral TID  . insulin aspart  0-15 Units Subcutaneous TID WC  . insulin aspart  0-5 Units Subcutaneous QHS  . insulin aspart  3 Units Subcutaneous TID WC  . insulin detemir  27 Units Subcutaneous QHS  . labetalol  100 mg Oral BID  . lidocaine  1 patch Transdermal Q24H  . lipase/protease/amylase  72,000 Units Oral TID WC  . pantoprazole  40 mg Oral Daily  . rifaximin  200 mg Oral TID  . tiZANidine  8 mg Oral QHS  . venlafaxine XR  75 mg Oral Daily  . zolpidem  5 mg Oral QHS   Continuous Infusions: . lactated ringers 75 mL/hr at 01/10/21 0636    Procedures/Studies: CT ABDOMEN PELVIS WO CONTRAST  Result Date: 01/07/2021 CLINICAL DATA:  Abdominal pain, Crohn's disease with worsening pain, nausea, vomiting, diarrhea EXAM: CT ABDOMEN AND PELVIS WITHOUT CONTRAST TECHNIQUE: Multidetector CT imaging of the abdomen and pelvis was performed following the standard protocol without IV contrast. Oral enteric contrast was administered. COMPARISON:  12/22/2020 FINDINGS: Lower chest: No acute  abnormality. Stable, benign small pulmonary nodules of the left lung base. Coronary artery calcifications. Hepatobiliary: No focal liver abnormality is seen. Status post cholecystectomy. No biliary dilatation. Pancreas: There is new thickening and fat stranding about the pancreas, particularly the pancreatic tail (series 2, image 25). No pancreatic ductal dilatation. Spleen: Normal in size without significant abnormality. Adrenals/Urinary Tract: Adrenal glands are unremarkable. Kidneys are normal, without renal calculi, solid lesion, or hydronephrosis. Bladder is unremarkable. Stomach/Bowel: Stomach is within normal limits. Appendix is not clearly visualized and may be surgically absent. The colon is fluid-filled. No evidence of bowel wall thickening, distention, or inflammatory changes. Vascular/Lymphatic: Aortic atherosclerosis. No enlarged abdominal or pelvic lymph nodes. Reproductive: Status post hysterectomy. Other: Left lower  quadrant hernia mesh repair. No abdominopelvic ascites. Musculoskeletal: No acute or significant osseous findings. IMPRESSION: 1. There is new thickening and fat stranding about the pancreas, particularly the pancreatic tail, consistent with acute pancreatitis. No evidence of acute pancreatic fluid collection on this noncontrast examination. 2. The colon is fluid-filled, consistent with diarrheal illness. No evidence of bowel obstruction. 3. Status post cholecystectomy, hysterectomy, and left lower quadrant hernia mesh repair. 4. Coronary artery disease. Aortic Atherosclerosis (ICD10-I70.0). Electronically Signed   By: Eddie Candle M.D.   On: 01/07/2021 15:48    Orson Eva, DO  [facility]  If 7PM-7AM, please contact night-coverage [email or web address] Password [email or web address] 01/10/2021, 4:30 PM   LOS: 4 days

## 2021-01-10 NOTE — Progress Notes (Signed)
When I made my initial contact with patient this evening, she made a few comments that did not make sense. She asked who the man was that was just in her room but there was no man in her room since her husband left. She was trying to tell me a story but the words were jumbled. She requested pain medication but I told her I did not feel comfortable giving her pain medication when she just had dilaudid two hours prior to our conversation. Placed a call to her husband who stated that she has been showing signs of dementia for the last several months.

## 2021-01-10 NOTE — TOC Benefit Eligibility Note (Signed)
Benefits check for Rifaximin 200mg  PO TID x 14 days.  This medication is covered with a $0 copay.  Heide Guile, PharmD, BCPS-AQ ID Clinical Pharmacist Phone [phone]

## 2021-01-10 NOTE — Progress Notes (Signed)
BS 239. Won't transfer over.

## 2021-01-10 NOTE — Progress Notes (Addendum)
01/09/2021 10:52 PM Nurse notified the patient's CBG was 146, 15 units of Levemir was given, continuous monitoring of CBG showed incremental CBG 239, the remaining 12 units of Levemir as recommended by diabetes coordinator was given.  Continue to monitor CBG.

## 2021-01-10 NOTE — Progress Notes (Addendum)
   01/10/21 2215  Provider Notification  Provider Name/Title dr Josephine Cables  Date Provider Notified 01/10/21  Time Provider Notified 2215  Notification Type Page  Notification Reason Change in status (increasing confusion, vitals stable, neuro assessment WDL except for disorientation to time, place, situation)  Provider response No new orders  Date of Provider Response 01/10/21  Time of Provider Response 2219    Received call back from Dr. Shara Blazing. Notified of patient having worsened confusion, patient reports seeing things and people in her room that are not there. Neuro assessment completed at bedside with assigned LPN and charge RN. Follows commands, but remains disoriented to place, situation. Reoriented as needed. bedalarm on for safety. Fall mat at bedside. Provider states nursing to continue to monitor and ensure fall prevention/safety measures in place. No new orders at this time.

## 2021-01-10 NOTE — Progress Notes (Signed)
Subjective: When eating, she has pain in her upper abdomen. About 8/10 currently.  Has not had any pain medications in several hours. While on clear liquids, she had some abdominal pain, but wasn't as bad. No nausea or vomiting. Had nausea without vomiting yesterday.   Some lower abdominal pain that worsens prior to BMs.  Improving.  No blood in the stool or black stool. 2 BMs yesterday and 1 this morning. Liquid.  Overall, diarrhea improved since admission. States she was having 10-12 BMs daily initially.     Mild shortness of breath. New cough. Just started this morning. Worried about pneumonia.   No history of alcohol use.   Objective: Vital signs in last 24 hours: Temp:  [98.4 F (36.9 C)-98.9 F (37.2 C)] 98.4 F (36.9 C) (03/15 0520) Pulse Rate:  [83-108] 108 (03/15 0802) Resp:  [19-20] 19 (03/15 0520) BP: (119-174)/(52-66) 174/66 (03/15 0802) SpO2:  [99 %-100 %] 99 % (03/15 0520) Last BM Date: 01/09/21 (per patient) General:   Alert and oriented, pleasant Head:  Normocephalic and atraumatic. Eyes:  No icterus, sclera clear. Conjuctiva pink.  Lungs: Clear to auscultation bilaterally, without wheezing, rales, or rhonchi.  Abdomen:  Bowel sounds present, soft, non-distended. Mild TTP across upper abdomen, primarily epigastric area.  Mild TTP across lower abdomen. No HSM or hernias noted. No rebound or guarding. No masses appreciated  Msk:  Symmetrical without gross deformities Extremities:  Without edema. Neurologic:  Alert and  oriented x4;  grossly normal neurologically. Skin:  Warm and dry, intact without significant lesions.  Psych: Normal mood and affect.  Intake/Output from previous day: 03/14 0701 - 03/15 0700 In: 1110 [P.O.:1110] Out: -  Intake/Output this shift: No intake/output data recorded.  Lab Results: Recent Labs    01/08/21 0524 01/09/21 0900 01/10/21 0632  WBC 8.3 9.4 8.1  HGB 8.7* 7.7* 7.2*  HCT 28.6* 24.8* 22.3*  PLT 219 158 167    BMET Recent Labs    01/08/21 0524 01/09/21 0900 01/10/21 0632  NA 139 131* 137  K 3.8 5.2* 5.4*  CL 109 107 110  CO2 16* 15* 18*  GLUCOSE 231* 244* 250*  BUN 23 28* 32*  CREATININE 1.84* 1.97* 1.75*  CALCIUM 8.7* 8.8* 9.0   LFT Recent Labs    01/08/21 0524 01/09/21 0900 01/10/21 0632  PROT 6.4* 6.1* 5.6*  ALBUMIN 3.2* 3.0* 2.7*  AST 20 22 22   ALT 11 15 15   ALKPHOS 74 68 54  BILITOT 0.1* 0.3 0.4    Assessment: 70 year old female with history of small bowel Crohn's disease diagnosed Jan 2022 via capsule and started on Humira Feb 2022, prior history of pancreatitis, admitted with severe hypertension and hypoglycemia, with GI consulted due to worsening abdominal pain and diarrhea. CT abd without IV contrast on 3/12 with acute uncomplicated pancreatitis, fluid filled colon, no bowel wall thickening or inflammatory changes. History of cholecystectomy.   Acute pancreatitis: Unclear etiology due to multiple contributing factors (drug effect with semaglutide, hypertriglyceridemia, ?autoimmune though IgG4 subclass within normal limits). Triglycerides >900 several months ago and 597 this admission. LFTs wnl. No alcohol. Initially, lipase was 164 and now 87 today. Some improvement yesterday and diet was advanced to soft yesterday. Patient reports worse pain with eating since diet advancement. Notably, she has gone 14 hours without pain medication and only with very mild TTP across the upper abdomen, primarily epigastric. No nausea or vomiting.  Considering report of worsening pain with eating soft diet, will back her back  down to clear liquids for 24 hours.  If any worsening abdominal pain, she will need repeat CT.  Diarrhea: Recently diagnosed small bowel Crohn's disease and received 2 doses of Humira starting in Feb 2022. Long-standing history of diarrhea. CRP mildly elevated at 1.2 and sed rate elevated at 77. Avoiding steroids if at all possible due to diabetes. Unable to exclude flare  but most likely due to GI pathogen panel with enteroaggregative E.coli (EAEC). Xifaxan started 3/13. Cdiff not included in GI pathogen panel. Clinically, she has had some improvement in diarrhea, currently with about 2 watery BMs daily. She is currently on Bentyl which is also likely providing clinical improvement. We will hold off on C. diff testing for now, but if any worsening or persistent watery BMs, would recommend ruling out C. diff. Of note, she was also due to receive Humira yesterday. Husband brought dosing information to the hospital. Nursing staff will get this to the pharmacy.   Normocytic anemia: Hemoglobin 7.2 today, down from 10.2 on admission. No overt GI bleeding. Currently on Protonix daily. Will continue to monitor and transfuse as necessary. Likely influenced by IV hydration and underlying small bowel Crohn's disease with multiple erosions throughout small bowel.   Cough: New onset cough and mild SOB today. Lungs sound clear. Will notify Dr. Carles Collet. Will need to consider CXR to rule out pneumonia/edema secondary to IV fluids.   Plan: 1.  Back down to clear liquid for the next 24 hours and reassess.  2.  Continue IVFs, supportive measures. 3.  Complete course of Xifaxan, started 3/13.  4.  Continue Bentyl TID.  5.  Patient to receive Humira dose today. Husband brought dosing information and nursing staff is getting information to pharmacist.  6.  Holding off on steroids as diarrhea is clinically improving.  7.  Continue PPI daily.  8.  Follow H/H.  9.  Transfuse as necessary.  10. Will notify Dr. Carles Collet about cough and SOB.    LOS: 4 days    01/10/2021, 10:12 AM   Heather [name], [facility]

## 2021-01-11 DIAGNOSIS — I10 Essential (primary) hypertension: Secondary | ICD-10-CM

## 2021-01-11 DIAGNOSIS — K859 Acute pancreatitis without necrosis or infection, unspecified: Principal | ICD-10-CM

## 2021-01-11 DIAGNOSIS — E119 Type 2 diabetes mellitus without complications: Secondary | ICD-10-CM

## 2021-01-11 DIAGNOSIS — E876 Hypokalemia: Secondary | ICD-10-CM | POA: Diagnosis not present

## 2021-01-11 DIAGNOSIS — A044 Other intestinal Escherichia coli infections: Secondary | ICD-10-CM | POA: Diagnosis not present

## 2021-01-11 DIAGNOSIS — K50018 Crohn's disease of small intestine with other complication: Secondary | ICD-10-CM | POA: Diagnosis not present

## 2021-01-11 LAB — CBC
HCT: 23.1 % — ABNORMAL LOW (ref 36.0–46.0)
Hemoglobin: 7.1 g/dL — ABNORMAL LOW (ref 12.0–15.0)
MCH: 30.2 pg (ref 26.0–34.0)
MCHC: 30.7 g/dL (ref 30.0–36.0)
MCV: 98.3 fL (ref 80.0–100.0)
Platelets: 187 10*3/uL (ref 150–400)
RBC: 2.35 MIL/uL — ABNORMAL LOW (ref 3.87–5.11)
RDW: 15.4 % (ref 11.5–15.5)
WBC: 7.4 10*3/uL (ref 4.0–10.5)
nRBC: 0 % (ref 0.0–0.2)

## 2021-01-11 LAB — COMPREHENSIVE METABOLIC PANEL
ALT: 16 U/L (ref 0–44)
AST: 23 U/L (ref 15–41)
Albumin: 2.9 g/dL — ABNORMAL LOW (ref 3.5–5.0)
Alkaline Phosphatase: 60 U/L (ref 38–126)
Anion gap: 10 (ref 5–15)
BUN: 30 mg/dL — ABNORMAL HIGH (ref 8–23)
CO2: 18 mmol/L — ABNORMAL LOW (ref 22–32)
Calcium: 9.5 mg/dL (ref 8.9–10.3)
Chloride: 114 mmol/L — ABNORMAL HIGH (ref 98–111)
Creatinine, Ser: 1.62 mg/dL — ABNORMAL HIGH (ref 0.44–1.00)
GFR, Estimated: 34 mL/min — ABNORMAL LOW (ref >60)
Glucose, Bld: 67 mg/dL — ABNORMAL LOW (ref 70–99)
Potassium: 4.8 mmol/L (ref 3.5–5.1)
Sodium: 142 mmol/L (ref 135–145)
Total Bilirubin: 0.2 mg/dL — ABNORMAL LOW (ref 0.3–1.2)
Total Protein: 6.1 g/dL — ABNORMAL LOW (ref 6.5–8.1)

## 2021-01-11 LAB — GLUCOSE, CAPILLARY
Glucose-Capillary: 115 mg/dL — ABNORMAL HIGH (ref 70–99)
Glucose-Capillary: 151 mg/dL — ABNORMAL HIGH (ref 70–99)
Glucose-Capillary: 73 mg/dL (ref 70–99)
Glucose-Capillary: 252 mg/dL — ABNORMAL HIGH (ref 70–99)

## 2021-01-11 MED ORDER — ADULT MULTIVITAMIN W/MINERALS CH
1.0000 | ORAL_TABLET | Freq: Every day | ORAL | Status: DC
  Administered 2021-01-11 – 2021-01-12 (×2): 1 via ORAL
  Filled 2021-01-11 (×2): qty 1

## 2021-01-11 MED ORDER — GLUCERNA SHAKE PO LIQD
237.0000 mL | Freq: Three times a day (TID) | ORAL | Status: DC
  Administered 2021-01-11 – 2021-01-12 (×4): 237 mL via ORAL

## 2021-01-11 MED ORDER — OXYCODONE HCL 5 MG PO TABS
5.0000 mg | ORAL_TABLET | Freq: Four times a day (QID) | ORAL | Status: DC | PRN
  Administered 2021-01-12: 5 mg via ORAL
  Filled 2021-01-11: qty 1

## 2021-01-11 MED ORDER — PANCRELIPASE (LIP-PROT-AMYL) 12000-38000 UNITS PO CPEP
36000.0000 [IU] | ORAL_CAPSULE | ORAL | Status: DC
  Administered 2021-01-11 – 2021-01-12 (×2): 36000 [IU] via ORAL
  Filled 2021-01-11 (×2): qty 3

## 2021-01-11 MED ORDER — INSULIN DETEMIR 100 UNIT/ML ~~LOC~~ SOLN
22.0000 [IU] | Freq: Every day | SUBCUTANEOUS | Status: DC
  Administered 2021-01-11: 22 [IU] via SUBCUTANEOUS
  Filled 2021-01-11 (×2): qty 0.22

## 2021-01-11 MED ORDER — PROSOURCE PLUS PO LIQD
30.0000 mL | Freq: Two times a day (BID) | ORAL | Status: DC
  Administered 2021-01-11 – 2021-01-12 (×3): 30 mL via ORAL
  Filled 2021-01-11 (×3): qty 30

## 2021-01-11 MED ORDER — ACETAMINOPHEN 325 MG PO TABS
650.0000 mg | ORAL_TABLET | Freq: Four times a day (QID) | ORAL | Status: DC | PRN
  Administered 2021-01-12: 650 mg via ORAL
  Filled 2021-01-11: qty 2

## 2021-01-11 MED ORDER — HYDROMORPHONE HCL 1 MG/ML IJ SOLN
0.5000 mg | INTRAMUSCULAR | Status: DC | PRN
  Administered 2021-01-12: 0.5 mg via INTRAVENOUS
  Filled 2021-01-11: qty 0.5

## 2021-01-11 NOTE — Progress Notes (Signed)
Subjective:  Patient states she has pain 8/10 when I wake her up from sleep. Sitter in the room due to some confusion developing yesterday. Patient reports 3 BMs today. States she got up and went to restroom for one prior to lunch, sitter states she didn't get out of bed. She did have at least two loose stools in the bed. Patient reports vomiting, sitter states she has not seen any. Patient only took 3 bites of lunch ([place] sandwich). Denies worse abd pain after eating. Complains of nausea.   Objective: Vital signs in last 24 hours: Temp:  [98.5 F (36.9 C)-98.6 F (37 C)] 98.5 F (36.9 C) (03/16 0510) Pulse Rate:  [89-97] 97 (03/16 0510) Resp:  [18] 18 (03/16 0510) BP: (126-164)/(63-102) 164/63 (03/16 0510) SpO2:  [99 %-100 %] 100 % (03/16 0510) Last BM Date: 01/11/21 General:   Sleeping upon entering room. Easily awakens. Patients history is not consistent with sitter.  Head:  Normocephalic and atraumatic. Eyes:  Sclera clear, no icterus.  Abdomen:  Soft, mild epigastric and lower abdominal tenderness. No masses, hepatosplenomegaly or hernias noted. Normal bowel sounds, without guarding, and without rebound.   Extremities:  Without clubbing, deformity or edema. Neurologic:  Alert and  oriented x3;  grossly normal neurologically. Skin:  Intact without significant lesions or rashes. Psych:  Alert and cooperative. Normal mood and affect.  Intake/Output from previous day: 03/15 0701 - 03/16 0700 In: 4640.3 [P.O.:240; I.V.:4400.3] Out: -  Intake/Output this shift: No intake/output data recorded.  Lab Results: CBC Recent Labs    01/09/21 0900 01/10/21 0632 01/11/21 0523  WBC 9.4 8.1 7.4  HGB 7.7* 7.2* 7.1*  HCT 24.8* 22.3* 23.1*  MCV 99.2 96.1 98.3  PLT 158 167 187   BMET Recent Labs    01/09/21 0900 01/10/21 0632 01/11/21 0523  NA 131* 137 142  K 5.2* 5.4* 4.8  CL 107 110 114*  CO2 15* 18* 18*  GLUCOSE 244* 250* 67*  BUN 28* 32* 30*  CREATININE 1.97* 1.75* 1.62*   CALCIUM 8.8* 9.0 9.5   LFTs Recent Labs    01/09/21 0900 01/10/21 0632 01/11/21 0523  BILITOT 0.3 0.4 0.2*  ALKPHOS 68 62 60  AST 22 22 23   ALT 15 15 16   PROT 6.1* 5.6* 6.1*  ALBUMIN 3.0* 2.7* 2.9*   Recent Labs    01/09/21 0900 01/10/21 0632  LIPASE 127* 87*   PT/INR No results for input(s): LABPROT, INR in the last 72 hours.    Imaging Studies: CT ABDOMEN PELVIS WO CONTRAST  Result Date: 01/07/2021 CLINICAL DATA:  Abdominal pain, Crohn's disease with worsening pain, nausea, vomiting, diarrhea EXAM: CT ABDOMEN AND PELVIS WITHOUT CONTRAST TECHNIQUE: Multidetector CT imaging of the abdomen and pelvis was performed following the standard protocol without IV contrast. Oral enteric contrast was administered. COMPARISON:  12/22/2020 FINDINGS: Lower chest: No acute abnormality. Stable, benign small pulmonary nodules of the left lung base. Coronary artery calcifications. Hepatobiliary: No focal liver abnormality is seen. Status post cholecystectomy. No biliary dilatation. Pancreas: There is new thickening and fat stranding about the pancreas, particularly the pancreatic tail (series 2, image 25). No pancreatic ductal dilatation. Spleen: Normal in size without significant abnormality. Adrenals/Urinary Tract: Adrenal glands are unremarkable. Kidneys are normal, without renal calculi, solid lesion, or hydronephrosis. Bladder is unremarkable. Stomach/Bowel: Stomach is within normal limits. Appendix is not clearly visualized and may be surgically absent. The colon is fluid-filled. No evidence of bowel wall thickening, distention, or inflammatory changes. Vascular/Lymphatic: Aortic atherosclerosis.  No enlarged abdominal or pelvic lymph nodes. Reproductive: Status post hysterectomy. Other: Left lower quadrant hernia mesh repair. No abdominopelvic ascites. Musculoskeletal: No acute or significant osseous findings. IMPRESSION: 1. There is new thickening and fat stranding about the pancreas, particularly  the pancreatic tail, consistent with acute pancreatitis. No evidence of acute pancreatic fluid collection on this noncontrast examination. 2. The colon is fluid-filled, consistent with diarrheal illness. No evidence of bowel obstruction. 3. Status post cholecystectomy, hysterectomy, and left lower quadrant hernia mesh repair. 4. Coronary artery disease. Aortic Atherosclerosis (ICD10-I70.0). Electronically Signed   By: Eddie Candle M.D.   On: 01/07/2021 15:48   DG CHEST PORT 1 VIEW  Result Date: 01/10/2021 CLINICAL DATA:  Dyspnea with exertion, cough. EXAM: PORTABLE CHEST 1 VIEW COMPARISON:  January 13, 2020. FINDINGS: The heart size and mediastinal contours are within normal limits. Both lungs are clear. No pneumothorax or pleural effusion is noted. The visualized skeletal structures are unremarkable. IMPRESSION: No active disease. Electronically Signed   By: Marijo Conception M.D.   On: 01/10/2021 16:35  [2 weeks]   Assessment: 70 year old female with history of small bowel Crohn's disease diagnosed in January 2022 via capsule and started on Humira February 2022, prior history of pancreatitis, admitted with severe hypertension and hypoglycemia, GI consulted due to worsening abdominal pain and diarrhea.  CT abdomen without IV contrast on March 12 with acute uncomplicated pancreatitis, fluid-filled colon, no bowel wall thickening or inflammatory changes.  History of cholecystectomy.  Acute pancreatitis: Possibly related to semaglutide, hypertriglyceridemia.  Triglycerides greater than 900 several months ago, 1488 4 years ago, 597 this admission.  LFTs normal.  No alcohol.  Subjectively, patient does not feel like her abdominal pain has improved.  She has required less pain medication however.  Clinically exam improved.  Diarrhea: Recent diagnosis of small bowel Crohn's disease, 2 doses of Humira started in February.  CRP mildly elevated at 1.2, sed rate 77.  Stool positive for enteroaggregate E. coli,  Xifaxin started March 13 by attending.  Cannot exclude Crohn's flare but nothing seen on imaging to suggest.  C. difficile testing was negative as an outpatient 1 month ago. Trying to avoid steroids due to diabetes.  Normocytic anemia: Hemoglobin 7.1, down from 10.2 on admission.  No overt GI bleeding.  Likely in part hemodilution, underlying small bowel Crohn's.  Plan: 1. Complete course of Xifaxan. Could consider azithromycin for EAEC coverage. 2. Continue Bentyl 3 times daily. 3. Continue Humira. 4. Continue PPI. 5. Continue Creon with meals, add with snacks.  6. Management in mental status issues by attending.  7. Close follow up with Dr. Carlean Purl as outpatient.   Laureen Ochs. Bernarda Caffey [facility] [phone] 3/16/20223:14 PM     LOS: 5 days

## 2021-01-11 NOTE — Progress Notes (Signed)
Patients BS 67mg /dl. Given PO orange juice. Notified Dr. Dyann Kief., will recheck sugar after orange juice.

## 2021-01-11 NOTE — Progress Notes (Signed)
PROGRESS NOTE  Heather [name] DDU:[id number] DOB: 08/14/1951 DOA: 01/06/2021 PCP: [name], MD  Brief History: 70 year old female with a history of diabetes mellitus type 2, hyperlipidemia, Crohn's disease, CKD stage III, B12 deficiency, anxiety disorder presenting from her nephrology office secondary to elevated blood pressures. The patient is a difficult historian, but she is able to give generalities. The details of her history are somewhat conflicting. Nevertheless, it appears that the patient has been having nausea, vomiting, and diarrhea with worsening abdominal pain since 01/02/2021. She states that she began taking Lomotil, but it did not help much. There is no hematemesis, hematochezia or melena. Therefore, she quit taking it on 01/04/2021. She had denied any fevers, chills, headache, sore throat, coughing, hemoptysis, chest pain, shortness of breath, dysuria or hematuria. Apparently, she states that over the past 2 days she has noted some high readings on her glucometer at home. However,she says that she has had decreased oral intake since 01/02/2021. She feels that each time she eats she has increase of her loose stools. She denies any antibiotics or other new medications. Apparently, she went to see her usual nephrologist, Dr. Joline Maxcy 01/06/2021. She was noted to have elevated blood pressures in the office. As result, she was advised to go to the emergency department for further evaluation. In the emergency department, the patient was afebrile hemodynamically stable with oxygen saturation 98% on room air. She was noted to be hypertensive with blood pressure up to 218/85. She was given hydralazine IV with improvement. In addition, the patient was noted to have acute on chronic renal failure with serum creatinine up to 2.04.This is above her usual baseline. In addition, she was noted to have hypokalemia with a potassium 2.3 and hypomagnesemia with potassium 1.3.  WBC 12.9, hemoglobin 10.2, platelets 210,000. Due to her electrolyte abnormalities and acute kidney injury, the patient was minute for further evaluation and treatment.  Since arrival to the hospital(nearly 20 hours now), the patient states that she has only had 1 bowel movement, but none has been documented by nursing. She has not had any hematochezia or melena.   Assessment/Plan: Acute on chronic renal failure--CKD 3b -Baseline creatinine 1.5-1.7 -Presented with serum creatinine 2.04 -Secondary to volume depletion -Continue IV fluids -Continue holding hold lasix -Continue to follow creatinine trend; currently 1.62 (essentially at baseline).  Acute Pancreatitis -multifactorial elevated triglycerides, semaglutide, and possible autoimmune -3/15 downgrade to clears as she continues to have abd pain with meal -overall abd pain is improving -continue IVF, as needed antiemetics and analgesics -Continue Creon -triglycerides 597 -01/07/21 CT abd--new thickening and fat stranding about the pancreas, particularly the pancreatic tail; colon is fluid-filled, consistent with diarrheal illness; s/p chole and hyst.  Hypokalemia -Secondary to GI loss -Improved and within normal limits currently.   -Initial I will do repletion and deleted to mild hyperkalemia; see below for treatment details.  Hypomagnesemia -In the setting of GI losses repleted -continue to follow trend   Diarrhea/E coli (EAEC) Enteritis -pt claims 2-3 BMs so far -stool pathogen panel--positive for E.coli -CRP 1.2 -ESR 77 -GI consult appreciated -due to intolerance or "allergy" to quinolones, started rifaximin  Hypertension>>>Hypotension -The patient was given labetalol 300 mgat 2230, dilaudid at 2220, and hydralazine 10 mg IV at 1824 on 01/06/21 -This resulted in the patient developing hypotension -Continue IV fluids -Continue holding amlodipine -Continue adjusted dose of labetalol -3/13--hypotension  improved  Diabetes mellitus type 2 -Interestingly, the patient was actually hypoglycemic  in the [place] contrast to her reported "high" sugars on her glucometer at home -01/06/21 A1C--11.0 -Continue Decreased dose Lantus--increase to 22 units -add novolog 3 units with meals -NovoLog sliding scale -Continue holding  semaglutide -Patient with mild hypoglycemic event today.  Hyperlipidemia/Hypertriglyceridemia -continue statin and fenofibrate  Depression/Anxiety -continue Effexor -No suicidal ideation or hallucination.  Hyperkalemia -lokelma x 2 (3/14 and 3/15) -potassium WNL currently -Continue to monitor electrolytes trend  Dyspnea/cough -stable on RA--99 to 100% -CXR w/o acute cardiopulmonary process. -denies SOB and coughing spells to me.   Status is: Inpatient  Remains inpatient appropriate because:IV treatments appropriate due to intensity of illness or inability to take PO   Dispo: The patient is from:Home Anticipated d/c is LF:YBOF Patient currently is not medically stable to d/c. Difficult to place patient No    Family Communication:noFamily at bedside  Consultants:none  Code Status: FULL   DVT Prophylaxis: [place] Lovenox   Procedures: As Listed in Progress Note Above  Antibiotics: None   Subjective: Afebrile, no chest pain, no appreciated vomiting.  Patient reports anorexia, ongoing epigastric pain and diarrhea.  Objective: Vitals:   01/10/21 1412 01/10/21 2106 01/11/21 0510 01/11/21 1442  BP: (!) 126/102 (!) 145/65 (!) 164/63 (!) 119/53  Pulse: 95 89 97 87  Resp: _0 Temp: 98.6 F (37 C) 98.5 F (36.9 C) 98.5 F (36.9 C) 98.2 F (36.8 C)  TempSrc: Oral Oral Oral Oral  SpO2: 99% 99% 100% 97%  Weight:      Height:        Intake/Output Summary (Last 24 hours) at 01/11/2021 1741 Last data filed at 01/11/2021 1230 Gross per 24 hour  Intake 5360.28 ml  Output -  Net  5360.28 ml   Weight change:   Exam: General exam: Alert, awake, oriented x 3; following commands appropriately. Reporting loose BM (overall better), also complaining of nausea and anorexia. Patient reported 1 episode of vomiting, but not seen by bedside sitter. Respiratory system: Clear to auscultation. Respiratory effort normal. Cardiovascular system:RRR. No murmurs, rubs, gallops. Gastrointestinal system: Abdomen is nondistended, soft and tender to palpation in epigastric region. Normal bowel sounds heard. Central nervous system: Alert and oriented. No focal neurological deficits. Extremities: No C/C/E, +pedal pulses Skin: No rashes, no petechiae Psychiatry: Mood & affect appropriate.    Data Reviewed: I have personally reviewed following labs and imaging studies  Basic Metabolic Panel: Recent Labs  Lab 01/06/21 1403 01/07/21 0611 01/08/21 0524 01/09/21 0900 01/10/21 0632 01/11/21 0523  NA 134* 136 139 131* 137 142  K 2.3* 3.8 3.8 5.2* 5.4* 4.8  CL 94* 106 109 107 110 114*  CO2 24 19* 16* 15* 18* 18*  GLUCOSE 100* 289* 231* 244* 250* 67*  BUN 28* 23 23 28* 32* 30*  CREATININE 2.04* 1.80* 1.84* 1.97* 1.75* 1.62*  CALCIUM 9.3 8.1* 8.7* 8.8* 9.0 9.5  MG 1.3* 1.4*  --   --   --   --    Liver Function Tests: Recent Labs  Lab 01/06/21 1403 01/08/21 0524 01/09/21 0900 01/10/21 0632 01/11/21 0523  AST _1 ALT _2 ALKPHOS 91 74 68 62 60  BILITOT 0.2* 0.1* 0.3 0.4 0.2*  PROT 7.7 6.4* 6.1* 5.6* 6.1*  ALBUMIN 3.7 3.2* 3.0* 2.7* 2.9*   Recent Labs  Lab 01/07/21 1607 01/09/21 0900 01/10/21 0632  LIPASE 164* 127* 87*   CBC: Recent Labs  Lab 01/06/21 1403 01/07/21 0611 01/08/21 0524 01/09/21  0900 01/10/21 0632 01/11/21 0523  WBC 12.9* 9.6 8.3 9.4 8.1 7.4  NEUTROABS 8.7*  --   --   --   --   --   HGB 10.2* 8.4* 8.7* 7.7* 7.2* 7.1*  HCT 30.3* 26.2* 28.6* 24.8* 22.3* 23.1*  MCV 89.6 93.2 97.3 99.2 96.1 98.3  PLT 310 199 219 158 167 187    CBG: Recent Labs  Lab 01/10/21 1116 01/10/21 1631 01/10/21 2125 01/11/21 0729 01/11/21 1104  GLUCAP 167* 115* 151* 73 252*   Urine analysis:    Component Value Date/Time   COLORURINE YELLOW 03/02/2020 [place] 03/02/2020 1420   LABSPEC 1.012 03/02/2020 1420   PHURINE 5.0 03/02/2020 1420   GLUCOSEU NEGATIVE 03/02/2020 1420   GLUCOSEU NEGATIVE 03/28/2018 1224   [place] 03/02/2020 1420   [place] 03/02/2020 1420   KETONESUR NEGATIVE 03/02/2020 1420   PROTEINUR 100 (A) 03/02/2020 1420   UROBILINOGEN 1.0 03/28/2018 1224   NITRITE NEGATIVE 03/02/2020 1420   LEUKOCYTESUR TRACE (A) 03/02/2020 1420   Sepsis Labs:  Recent Results (from the past 240 hour(s))  Resp Panel by RT-PCR (Flu A&B, Covid) Nasopharyngeal Swab     Status: None   Collection Time: 01/06/21  4:02 PM   Specimen: Nasopharyngeal Swab; Nasopharyngeal(NP) swabs in vial transport medium  Result Value Ref Range Status   SARS Coronavirus 2 by RT PCR NEGATIVE NEGATIVE Final    Comment: (NOTE) SARS-CoV-2 target nucleic acids are NOT DETECTED.  The SARS-CoV-2 RNA is generally detectable in upper respiratory specimens during the acute phase of infection. The lowest concentration of SARS-CoV-2 viral copies this assay can detect is 138 copies/mL. A negative result does not preclude SARS-Cov-2 infection and should not be used as the sole basis for treatment or other patient management decisions. A negative result may occur with  improper specimen collection/handling, submission of specimen other than nasopharyngeal swab, presence of viral mutation(s) within the areas targeted by this assay, and inadequate number of viral copies(<138 copies/mL). A negative result must be combined with clinical observations, patient history, and epidemiological information. The expected result is Negative.  Fact Sheet for Patients:  EntrepreneurPulse.com.au  Fact Sheet for  Healthcare Providers:  IncredibleEmployment.be  This test is no t yet approved or cleared by the [place] FDA and  has been authorized for detection and/or diagnosis of SARS-CoV-2 by FDA under an Emergency Use Authorization (EUA). This EUA will remain  in effect (meaning this test can be used) for the duration of the COVID-19 declaration under Section 564(b)(1) of the Act, 21 U.S.C.section 360bbb-3(b)(1), unless the authorization is terminated  or revoked sooner.       Influenza A by PCR NEGATIVE NEGATIVE Final   Influenza B by PCR NEGATIVE NEGATIVE Final    Comment: (NOTE) The Xpert Xpress SARS-CoV-2/FLU/RSV plus assay is intended as an aid in the diagnosis of influenza from Nasopharyngeal swab specimens and should not be used as a sole basis for treatment. Nasal washings and aspirates are unacceptable for Xpert Xpress SARS-CoV-2/FLU/RSV testing.  Fact Sheet for Patients: EntrepreneurPulse.com.au  Fact Sheet for Healthcare Providers: IncredibleEmployment.be  This test is not yet approved or cleared by the [place] FDA and has been authorized for detection and/or diagnosis of SARS-CoV-2 by FDA under an Emergency Use Authorization (EUA). This EUA will remain in effect (meaning this test can be used) for the duration of the COVID-19 declaration under Section 564(b)(1) of the Act, 21 U.S.C. section 360bbb-3(b)(1), unless the authorization is terminated or  revoked.  Performed at [facility], [address] [place], [place] [address]   Gastrointestinal Panel by PCR , Stool     Status: Abnormal   Collection Time: 01/07/21  2:50 PM   Specimen: STOOL  Result Value Ref Range Status   Campylobacter species NOT DETECTED NOT DETECTED Final   Plesimonas shigelloides NOT DETECTED NOT DETECTED Final   Salmonella species NOT DETECTED NOT DETECTED Final   Yersinia enterocolitica NOT DETECTED NOT DETECTED Final   Vibrio  species NOT DETECTED NOT DETECTED Final   Vibrio cholerae NOT DETECTED NOT DETECTED Final   Enteroaggregative E coli (EAEC) DETECTED (A) NOT DETECTED Final    Comment: RESULT CALLED TO, READ BACK BY AND VERIFIED WITH: LAUREN COLEMAN 01/08/21 AT 1555 BY ACR    Enteropathogenic E coli (EPEC) NOT DETECTED NOT DETECTED Final   Enterotoxigenic E coli (ETEC) NOT DETECTED NOT DETECTED Final   Shiga like toxin producing E coli (STEC) NOT DETECTED NOT DETECTED Final   Shigella/Enteroinvasive E coli (EIEC) NOT DETECTED NOT DETECTED Final   Cryptosporidium NOT DETECTED NOT DETECTED Final   Cyclospora cayetanensis NOT DETECTED NOT DETECTED Final   Entamoeba histolytica NOT DETECTED NOT DETECTED Final   Giardia lamblia NOT DETECTED NOT DETECTED Final   Adenovirus F40/41 NOT DETECTED NOT DETECTED Final   Astrovirus NOT DETECTED NOT DETECTED Final   Norovirus GI/GII NOT DETECTED NOT DETECTED Final   Rotavirus A NOT DETECTED NOT DETECTED Final   Sapovirus (I, II, IV, and V) NOT DETECTED NOT DETECTED Final    Comment: Performed at [facility], [address], [place], [place] [address]     Scheduled Meds: . (feeding supplement) PROSource Plus  30 mL Oral BID BM  . atorvastatin  80 mg Oral q morning  . citalopram  20 mg Oral Daily  . dicyclomine  20 mg Oral TID AC  . enoxaparin (LOVENOX) injection  30 mg Subcutaneous Q24H  . feeding supplement (GLUCERNA SHAKE)  237 mL Oral TID BM  . fenofibrate  160 mg Oral Daily  . gabapentin  300 mg Oral TID  . insulin aspart  0-15 Units Subcutaneous TID WC  . insulin aspart  0-5 Units Subcutaneous QHS  . insulin aspart  3 Units Subcutaneous TID WC  . insulin detemir  22 Units Subcutaneous QHS  . labetalol  100 mg Oral BID  . lidocaine  1 patch Transdermal Q24H  . lipase/protease/amylase  36,000 Units Oral With snacks  . lipase/protease/amylase  72,000 Units Oral TID WC  . multivitamin with minerals  1 tablet Oral Daily  . pantoprazole  40 mg Oral  Daily  . rifaximin  200 mg Oral TID  . tiZANidine  8 mg Oral QHS  . venlafaxine XR  75 mg Oral Daily  . zolpidem  5 mg Oral QHS   Continuous Infusions: . lactated ringers 75 mL/hr at 01/11/21 1004    Procedures/Studies: CT ABDOMEN PELVIS WO CONTRAST  Result Date: 01/07/2021 CLINICAL DATA:  Abdominal pain, Crohn's disease with worsening pain, nausea, vomiting, diarrhea EXAM: CT ABDOMEN AND PELVIS WITHOUT CONTRAST TECHNIQUE: Multidetector CT imaging of the abdomen and pelvis was performed following the standard protocol without IV contrast. Oral enteric contrast was administered. COMPARISON:  12/22/2020 FINDINGS: Lower chest: No acute abnormality. Stable, benign small pulmonary nodules of the left lung base. Coronary artery calcifications. Hepatobiliary: No focal liver abnormality is seen. Status post cholecystectomy. No biliary dilatation. Pancreas: There is new thickening and fat stranding about the pancreas, particularly the pancreatic  tail (series 2, image 25). No pancreatic ductal dilatation. Spleen: Normal in size without significant abnormality. Adrenals/Urinary Tract: Adrenal glands are unremarkable. Kidneys are normal, without renal calculi, solid lesion, or hydronephrosis. Bladder is unremarkable. Stomach/Bowel: Stomach is within normal limits. Appendix is not clearly visualized and may be surgically absent. The colon is fluid-filled. No evidence of bowel wall thickening, distention, or inflammatory changes. Vascular/Lymphatic: Aortic atherosclerosis. No enlarged abdominal or pelvic lymph nodes. Reproductive: Status post hysterectomy. Other: Left lower quadrant hernia mesh repair. No abdominopelvic ascites. Musculoskeletal: No acute or significant osseous findings. IMPRESSION: 1. There is new thickening and fat stranding about the pancreas, particularly the pancreatic tail, consistent with acute pancreatitis. No evidence of acute pancreatic fluid collection on this noncontrast examination. 2.  The colon is fluid-filled, consistent with diarrheal illness. No evidence of bowel obstruction. 3. Status post cholecystectomy, hysterectomy, and left lower quadrant hernia mesh repair. 4. Coronary artery disease. Aortic Atherosclerosis (ICD10-I70.0). Electronically Signed   By: Eddie Candle M.D.   On: 01/07/2021 15:48   DG CHEST PORT 1 VIEW  Result Date: 01/10/2021 CLINICAL DATA:  Dyspnea with exertion, cough. EXAM: PORTABLE CHEST 1 VIEW COMPARISON:  January 13, 2020. FINDINGS: The heart size and mediastinal contours are within normal limits. Both lungs are clear. No pneumothorax or pleural effusion is noted. The visualized skeletal structures are unremarkable. IMPRESSION: No active disease. Electronically Signed   By: Marijo Conception M.D.   On: 01/10/2021 16:35    Barton Dubois, MD  [facility]  If 7PM-7AM, please contact night-coverage [email or web address] Password [facility] 01/11/2021, 5:41 PM   LOS: 5 days

## 2021-01-11 NOTE — Progress Notes (Signed)
Patient has stated several times this shift that she has had runny bowel movements and has vomited. Her sitter stated she has not been to the bathroom to do either one.

## 2021-01-11 NOTE — Progress Notes (Signed)
Nutrition Follow-up  DOCUMENTATION CODES:   Not applicable  INTERVENTION:   -D/c Boost Breeze po TID, each supplement provides 250 kcal and 9 grams of protein -Glucerna Shake po TID, each supplement provides 220 kcal and 10 grams of protein -MVI with minerals daily -Continue 30 ml Prosource Plus BID, each supplement provides 100 kcals and 15 grams protein  NUTRITION DIAGNOSIS:   Inadequate oral intake related to nausea,vomiting as evidenced by per patient/family report.  Ongoing  GOAL:   Patient will meet greater than or equal to 90% of their needs  Progressing   MONITOR:   PO intake,Supplement acceptance,Diet advancement,Weight trends,I & O's,Labs  REASON FOR ASSESSMENT:   Malnutrition Screening Tool    ASSESSMENT:   Pt admitted with acute pancreatitis and acute on chronic renal failure. PMH includes small bowel Crohn's disease, CKD stage 3, depression, diabetes, GERD, anxiety, previous history of pancreatitis, IBS, hyperlipidemia, stroke and salmonellosis.  3/15- advanced to soft diet  Reviewed I/O's: +4.6 L x 24 hours and +10.1 L since admission  Spoke with pt at bedside, who was in good spirits, laughing and joking with RD and sitter at time of visit. Pt shares that she has experienced decreased appetite and diarrhea on and off for about 6 months. During this time period, she estimates she has lost about 30# (per pt, UBW is around 230#). Reviewed wt hx; wt has been stable over the past 3 months.   Since admission, pt reports some improvement in oral intake. Documented meal percentages are 50-100%. Pt reports that intake is erratic at baseline and there are no trigger foods of nausea, abdominal pain, or diarrhea. She refused to eat breakfast today (potatoes, eggs, and ENglish muffin), as it was "too heavy for her". Pt shares that she refers Glucerna supplements over Boost Breeze; RD to modify orders.   Discussed importance of good meal and supplement intake to promote  healing.   Medications reviewed and include creon and lactated ringers infusion @ 75 ml/hr.   Lab Results  Component Value Date   HGBA1C 11.0 (H) 01/06/2021   PTA DM medications are 80 nits insulin detemir daily, 20 units insulin lispro TID, and 2 mg semaglutide weekly.   Labs reviewed: CBGS: 73-151 (inpatient orders for glycemic control are 0-15 units insulin aspart daily at bedtime, 3 units insulin aspart TID with meals, and 27 units insulin detemir daily).   NUTRITION - FOCUSED PHYSICAL EXAM:  Flowsheet Row Most Recent Value  Orbital Region No depletion  Upper Arm Region No depletion  Thoracic and Lumbar Region No depletion  Buccal Region No depletion  Temple Region No depletion  Clavicle Bone Region No depletion  Clavicle and Acromion Bone Region No depletion  Scapular Bone Region No depletion  Dorsal Hand No depletion  Patellar Region No depletion  Anterior Thigh Region No depletion  Posterior Calf Region No depletion  Edema (RD Assessment) None  Hair Reviewed  Eyes Reviewed  Mouth Reviewed  Skin Reviewed  Nails Reviewed       Diet Order:   Diet Order            DIET SOFT Room service appropriate? Yes; Fluid consistency: Thin  Diet effective now                 EDUCATION NEEDS:   No education needs have been identified at this time  Skin:  Skin Assessment: Reviewed RN Assessment  Last BM:  01/10/21  Height:   Ht Readings from Last 1 Encounters:  01/06/21 5\' 3"  (1.6 m)    Weight:   Wt Readings from Last 1 Encounters:  01/06/21 60 kg    Ideal Body Weight:  52.3 kg  BMI:  Body mass index is 23.43 kg/m.  Estimated Nutritional Needs:   Kcal:  1500-1700  Protein:  75-85 grams  Fluid:  >1.5L   Loistine Chance, RD, LDN, [place] Registered Dietitian II Certified Diabetes Care and Education Specialist Please refer to [facility] for RD and/or RD on-call/weekend/after hours pager

## 2021-01-11 NOTE — Plan of Care (Signed)

## 2021-01-11 NOTE — Progress Notes (Signed)
Patients CBG 73. Will give PO juice and reassess . No insulin given at this time

## 2021-01-12 DIAGNOSIS — K859 Acute pancreatitis without necrosis or infection, unspecified: Secondary | ICD-10-CM

## 2021-01-12 DIAGNOSIS — A044 Other intestinal Escherichia coli infections: Secondary | ICD-10-CM

## 2021-01-12 DIAGNOSIS — E1121 Type 2 diabetes mellitus with diabetic nephropathy: Secondary | ICD-10-CM

## 2021-01-12 DIAGNOSIS — K219 Gastro-esophageal reflux disease without esophagitis: Secondary | ICD-10-CM

## 2021-01-12 DIAGNOSIS — K858 Other acute pancreatitis without necrosis or infection: Secondary | ICD-10-CM

## 2021-01-12 DIAGNOSIS — E876 Hypokalemia: Secondary | ICD-10-CM | POA: Diagnosis not present

## 2021-01-12 DIAGNOSIS — N179 Acute kidney failure, unspecified: Secondary | ICD-10-CM | POA: Diagnosis not present

## 2021-01-12 DIAGNOSIS — K861 Other chronic pancreatitis: Secondary | ICD-10-CM

## 2021-01-12 DIAGNOSIS — K50018 Crohn's disease of small intestine with other complication: Secondary | ICD-10-CM

## 2021-01-12 LAB — GLUCOSE, CAPILLARY
Glucose-Capillary: 67 mg/dL — ABNORMAL LOW (ref 70–99)
Glucose-Capillary: 81 mg/dL (ref 70–99)
Glucose-Capillary: 344 mg/dL — ABNORMAL HIGH (ref 70–99)
Glucose-Capillary: 330 mg/dL — ABNORMAL HIGH (ref 70–99)
Glucose-Capillary: 80 mg/dL (ref 70–99)
Glucose-Capillary: 169 mg/dL — ABNORMAL HIGH (ref 70–99)
Glucose-Capillary: 172 mg/dL — ABNORMAL HIGH (ref 70–99)

## 2021-01-12 LAB — CBC
HCT: 25.5 % — ABNORMAL LOW (ref 36.0–46.0)
Hemoglobin: 7.4 g/dL — ABNORMAL LOW (ref 12.0–15.0)
MCH: 29.7 pg (ref 26.0–34.0)
MCHC: 29 g/dL — ABNORMAL LOW (ref 30.0–36.0)
MCV: 102.4 fL — ABNORMAL HIGH (ref 80.0–100.0)
Platelets: 199 10*3/uL (ref 150–400)
RBC: 2.49 MIL/uL — ABNORMAL LOW (ref 3.87–5.11)
RDW: 15.4 % (ref 11.5–15.5)
WBC: 5.3 10*3/uL (ref 4.0–10.5)
nRBC: 0 % (ref 0.0–0.2)

## 2021-01-12 MED ORDER — ALUM & MAG HYDROXIDE-SIMETH 200-200-20 MG/5ML PO SUSP
30.0000 mL | Freq: Once | ORAL | Status: AC
  Administered 2021-01-12: 30 mL via ORAL
  Filled 2021-01-12: qty 30

## 2021-01-12 MED ORDER — HUMIRA (2 PEN) 40 MG/0.4ML ~~LOC~~ AJKT: 40.0000 mg | AUTO-INJECTOR | SUBCUTANEOUS | Status: DC

## 2021-01-12 MED ORDER — FLUCONAZOLE 150 MG PO TABS: 150.0000 mg | ORAL_TABLET | Freq: Every day | ORAL | 0 refills | Status: AC

## 2021-01-12 MED ORDER — CITALOPRAM HYDROBROMIDE 20 MG PO TABS: 20.0000 mg | ORAL_TABLET | Freq: Every day | ORAL | 1 refills | Status: DC

## 2021-01-12 MED ORDER — DICYCLOMINE HCL 20 MG PO TABS: 20.0000 mg | ORAL_TABLET | Freq: Three times a day (TID) | ORAL | 1 refills | Status: DC

## 2021-01-12 MED ORDER — PROMETHAZINE HCL 25 MG PO TABS: 25.0000 mg | ORAL_TABLET | Freq: Three times a day (TID) | ORAL | 0 refills | Status: DC | PRN

## 2021-01-12 MED ORDER — PANCRELIPASE (LIP-PROT-AMYL) 36000-114000 UNITS PO CPEP: 36000.0000 [IU] | ORAL_CAPSULE | ORAL | 1 refills | Status: DC

## 2021-01-12 MED ORDER — LIDOCAINE VISCOUS HCL 2 % MT SOLN
15.0000 mL | Freq: Once | OROMUCOSAL | Status: AC
  Administered 2021-01-12: 15 mL via ORAL
  Filled 2021-01-12: qty 15

## 2021-01-12 NOTE — Plan of Care (Signed)

## 2021-01-12 NOTE — Progress Notes (Signed)
Patient c/o chest pain . Verbal order for EKG from Dr. Dyann Kief, Obtained and placed in chart. Patients VS are WNL. She c/o pain more under right breast bone, Will notify Dr. Dyann Kief

## 2021-01-12 NOTE — Progress Notes (Addendum)
Subjective:  "you can tell the pain is still there". Overall pain is improved since admission. States she had several bowel movements over night. Only one stool documented in the past 24 hours. Patient tolerates solid foods better than liquids. No vomiting. Patient states she was due for Humira this past Monday.   Objective: Vital signs in last 24 hours: Temp:  [98.2 F (36.8 C)-98.7 F (37.1 C)] 98.7 F (37.1 C) (03/17 0541) Pulse Rate:  [87-109] 97 (03/17 0541) Resp:  [18-20] 18 (03/17 0541) BP: (119-175)/(53-66) 153/61 (03/17 0541) SpO2:  [97 %-100 %] 100 % (03/17 0541) Last BM Date: 01/11/21 General:   Alert,  Well-developed, well-nourished, pleasant and cooperative in NAD. Appears much improved today. More alert and talkative.  Head:  Normocephalic and atraumatic. Eyes:  Sclera clear, no icterus.  Abdomen:  Soft,  nondistended.   Normal bowel sounds, without guarding, and without rebound.  Moans with minimal/light palpation mostly upper abdomen but lower suprapubic region as well. Extremities:  Without clubbing, deformity or edema. Neurologic:  Alert and  oriented x4;  grossly normal neurologically. Skin:  Intact without significant lesions or rashes. Psych:  Alert and cooperative. Normal mood and affect.  Intake/Output from previous day: 03/16 0701 - 03/17 0700 In: 1677 [P.O.:1440] Out: -  Intake/Output this shift: Total I/O In: 480 [P.O.:480] Out: -   Lab Results: CBC Recent Labs    01/10/21 0632 01/11/21 0523 01/12/21 0521  WBC 8.1 7.4 5.3  HGB 7.2* 7.1* 7.4*  HCT 22.3* 23.1* 25.5*  MCV 96.1 98.3 102.4*  PLT 167 187 199   BMET Recent Labs    01/10/21 0632 01/11/21 0523  NA 137 142  K 5.4* 4.8  CL 110 114*  CO2 18* 18*  GLUCOSE 250* 67*  BUN 32* 30*  CREATININE 1.75* 1.62*  CALCIUM 9.0 9.5   LFTs Recent Labs    01/10/21 0632 01/11/21 0523  BILITOT 0.4 0.2*  ALKPHOS 62 60  AST 22 23  ALT 15 16  PROT 5.6* 6.1*  ALBUMIN 2.7* 2.9*   Recent  Labs    01/10/21 9675  LIPASE 87*   PT/INR No results for input(s): LABPROT, INR in the last 72 hours.    Imaging Studies: CT ABDOMEN PELVIS WO CONTRAST  Result Date: 01/07/2021 CLINICAL DATA:  Abdominal pain, Crohn's disease with worsening pain, nausea, vomiting, diarrhea EXAM: CT ABDOMEN AND PELVIS WITHOUT CONTRAST TECHNIQUE: Multidetector CT imaging of the abdomen and pelvis was performed following the standard protocol without IV contrast. Oral enteric contrast was administered. COMPARISON:  12/22/2020 FINDINGS: Lower chest: No acute abnormality. Stable, benign small pulmonary nodules of the left lung base. Coronary artery calcifications. Hepatobiliary: No focal liver abnormality is seen. Status post cholecystectomy. No biliary dilatation. Pancreas: There is new thickening and fat stranding about the pancreas, particularly the pancreatic tail (series 2, image 25). No pancreatic ductal dilatation. Spleen: Normal in size without significant abnormality. Adrenals/Urinary Tract: Adrenal glands are unremarkable. Kidneys are normal, without renal calculi, solid lesion, or hydronephrosis. Bladder is unremarkable. Stomach/Bowel: Stomach is within normal limits. Appendix is not clearly visualized and may be surgically absent. The colon is fluid-filled. No evidence of bowel wall thickening, distention, or inflammatory changes. Vascular/Lymphatic: Aortic atherosclerosis. No enlarged abdominal or pelvic lymph nodes. Reproductive: Status post hysterectomy. Other: Left lower quadrant hernia mesh repair. No abdominopelvic ascites. Musculoskeletal: No acute or significant osseous findings. IMPRESSION: 1. There is new thickening and fat stranding about the pancreas, particularly the pancreatic tail, consistent with acute pancreatitis.  No evidence of acute pancreatic fluid collection on this noncontrast examination. 2. The colon is fluid-filled, consistent with diarrheal illness. No evidence of bowel obstruction. 3.  Status post cholecystectomy, hysterectomy, and left lower quadrant hernia mesh repair. 4. Coronary artery disease. Aortic Atherosclerosis (ICD10-I70.0). Electronically Signed   By: Eddie Candle M.D.   On: 01/07/2021 15:48   DG CHEST PORT 1 VIEW  Result Date: 01/10/2021 CLINICAL DATA:  Dyspnea with exertion, cough. EXAM: PORTABLE CHEST 1 VIEW COMPARISON:  January 13, 2020. FINDINGS: The heart size and mediastinal contours are within normal limits. Both lungs are clear. No pneumothorax or pleural effusion is noted. The visualized skeletal structures are unremarkable. IMPRESSION: No active disease. Electronically Signed   By: Marijo Conception M.D.   On: 01/10/2021 16:35  [2 weeks]   Assessment:  70 year old female with history of small bowel Crohn's disease diagnosed in January 2022 via capsule and started on Humira in February 2022, prior history of pancreatitis, admitted with severe hypertension and hypoglycemia, GI consulted due to worsening abdominal pain and diarrhea.  CT abdomen without IV contrast on March 12 with acute uncomplicated pancreatitis, fluid-filled colon, no bowel wall thickening or inflammatory changes.  History of cholecystectomy.  Acute pancreatitis: Possibly related to semaglutide, hypertriglyceridemia.  Triglycerides greater than 900 several months ago, 1488 4 years ago, 597 this admission.  LFTs normal.  No alcohol.  Clinically and subjectively abdominal pain has improved.  Has required less pain medication, 1 mg of Dilaudid total in the last 36 hours.  None required since 1651 yesterday.  Tolerating diet although does not prefer the food being served.  Diarrhea: Recent diagnosis of small bowel Crohn's disease, states she was due for her last Humira shot on Monday.  CRP minimally elevated at 1.2, sed rate 77.  Stool positive for enteroaggregate E. coli, Xifaxin started March 13 by attending.  Cannot exclude Crohn's flare but nothing seen on imaging to suggest.  C. difficile testing  -29-month ago as an outpatient.  Try to avoid steroids due to diabetes.  Normocytic anemia: Hemoglobin 7.1 yesterday, down from 10.2 admission.  Today hemoglobin stable at 7.4.  No overt GI bleeding.  Likely in part hemodilution, underlying small bowel Crohn's.    Plan: 1. Continue Bentyl 3 times daily. 2. She has completed adequate Xifaxan course and will not need at discharge.  3. Resume Humira Monday. 4. Continue PPI. 5. Continue Creon with meals and with snacks. 6. Appears stable for discharge from a GI standpoint. 7. She will need close follow-up with Dr. Arelia Longest as an outpatient. 8. Will sign off. Call with any questions/concerns.  Laureen Ochs. Bernarda Caffey [facility] [phone] 3/17/202210:17 AM     LOS: 6 days

## 2021-01-12 NOTE — Discharge Summary (Signed)
Physician Discharge Summary  Heather [name] IZT:[id number] DOB: 1950-12-14 DOA: 01/06/2021  PCP: [name], MD  Admit date: 01/06/2021 Discharge date: 01/12/2021  Time spent: 35 minutes  Recommendations for Outpatient Follow-up:  Reassess blood pressure and adjust antihypertensive regimen as needed Close monitoring of patient's CBGs/A1c with further adjustment to hypoglycemia regimen as required Repeat basic metabolic panel to evaluate lites renal function Repeat CBC to follow hemoglobin trend  Discharge Diagnoses:  Active Problems:   Irritable bowel syndrome   Chronic kidney disease, stage III (moderate) (HCC)   Essential hypertension   GERD (gastroesophageal reflux disease)   Crohn's disease of small intestine with other complication (HCC)   Type 2 diabetes mellitus without complications (HCC)   Acute hypokalemia   Acute renal failure superimposed on stage 3b chronic kidney disease (HCC)   Diarrhea   Acute on chronic pancreatitis (HCC)   E coli enteritis   Anemia   Discharge Condition: Stable and improved.  Discharged home with appropriate follow-up with PCP and gastroenterology as an outpatient.  CODE STATUS: DNR  Diet recommendation: Soft, low fat and low residue diet.  Filed Weights   01/06/21 1250  Weight: 60 kg    History of present illness:  70 year old female with a history of diabetes mellitus type 2, hyperlipidemia, Crohn's disease, CKD stage III, B12 deficiency, anxiety disorder presenting from her nephrology office secondary to elevated blood pressures. The patient is a difficult historian, but she is able to give generalities. The details of her history are somewhat conflicting. Nevertheless, it appears that the patient has been having nausea, vomiting, and diarrhea with worsening abdominal pain since 01/02/2021. She states that she began taking Lomotil, but it did not help much. There is no hematemesis, hematochezia or melena. Therefore, she quit  taking it on 01/04/2021. She had denied any fevers, chills, headache, sore throat, coughing, hemoptysis, chest pain, shortness of breath, dysuria or hematuria. Apparently, she states that over the past 2 days she has noted some high readings on her glucometer at home. However,she says that she has had decreased oral intake since 01/02/2021. She feels that each time she eats she has increase of her loose stools. She denies any antibiotics or other new medications. Apparently, she went to see her usual nephrologist, Dr. Joline Maxcy 01/06/2021. She was noted to have elevated blood pressures in the office. As result, she was advised to go to the emergency department for further evaluation. In the emergency department, the patient was afebrile hemodynamically stable with oxygen saturation 98% on room air. She was noted to be hypertensive with blood pressure up to 218/85. She was given hydralazine IV with improvement. In addition, the patient was noted to have acute on chronic renal failure with serum creatinine up to 2.04.This is above her usual baseline. In addition, she was noted to have hypokalemia with a potassium 2.3 and hypomagnesemia with potassium 1.3. WBC 12.9, hemoglobin 10.2, platelets 210,000. Due to her electrolyte abnormalities and acute kidney injury, the patient was minute for further evaluation and treatment.  Since arrival to the hospital(nearly 20 hours now), the patient states that she has only had 1 bowel movement, but none has been documented by nursing. She has not had any hematochezia or melena.  Hospital Course:  Acute on chronic renal failure--CKD 3b -Baseline creatinine 1.5-1.7 -Presented with serum creatinine 2.04 -Secondary to volume depletion -Resolved after fluid resuscitation -Safe to resume home medications at time of discharge (lasix) -Repeat basic metabolic panel to follow renal function trend at follow-up visit.  Acute on chronic  pancreatitis -multifactorial elevated triglycerides, ?? semaglutide, and possible autoimmune -3/15 downgrade to clears as she continues to have abd pain with meal -overall abd pain is improving and patient tolerating diet prior to discharge. -Continue Creon with meals and snacks -Continue the use of Bentyl 3 times a day -01/07/21 CT abd--new thickening and fat stranding about the pancreas, particularly the pancreatic tail; colon is fluid-filled, consistent with diarrheal illness; s/p chole and hyst. -Outpatient follow-up with gastroenterology as an outpatient.  Hypokalemia -Secondary to GI loss -Improved and within normal limits currently.   -Initial repletion lead to mild hyperkalemia; see below for treatment details.  Hypomagnesemia -In the setting of GI losses -repleted -Repeat magnesium level at follow-up visit to follow trend   Diarrhea/E coli(EAEC)Enteritis -pt claims2 bowel movements in the last 36 hours. -stool pathogen panel--positive for E.coli -CRP 1.2 -ESR 77 -GI consult appreciated. -due to intolerance or "allergy" to quinolones patient was treated with rifaximin.  Antibiotic therapy completed while inpatient.  Vaginal candidiasis -Symptoms appreciated after use of antibiotic -3 doses of daily Diflucan prescribed at discharge.  History of Crohn's disease -Resume Humira on 01/16/2021 -Continue outpatient follow-up with GI.  Hypertension>>>Hypotension -The patient was given labetalol 300 mgat 2230, dilaudid at 2220, and hydralazine 10 mg IV at 1824 on 01/06/21 -This resulted in the patient developing hypotension -Continue IV fluids -Continue holding amlodipine -Continue adjusted dose of labetalol -3/13--hypotension improved  Diabetes mellitus type 2 with nephropathy. -Interestingly, the patient was actually hypoglycemic in the EDin contrast to her reported "high" sugars on her glucometer at home -01/06/21 A1C--11.0 -Resume the use of semaglutide, along  with home hypoglycemic regimen. -Patient advised not to skip any meals and to be compliant with insulin therapy. -Modified carbohydrate diet encouraged.  Hyperlipidemia/Hypertriglyceridemia -continue statin and fenofibrate -Heart healthy diet recommended.  Depression/Anxiety -continue Effexor -No suicidal ideation or hallucination at time of discharge. -Continue outpatient follow-up with PCP and if needed referral to psychiatry service.  Hyperkalemia -lokelma x 2 given (3/14 and 3/15) -potassium WNL currently -Repeat basic metabolic panel to follow electrolytes trend and stability.  Dyspnea/cough -stable and with good oxygen saturation on RA--99 to 100% -CXR w/o acute cardiopulmonary process. -denies SOB and any coughing spells currently.  Procedures:  See below for x-ray reports.  Consultations:  Codes GI service  Discharge Exam: Vitals:   01/12/21 1200 01/12/21 1321  BP: (!) 141/60 126/60  Pulse: 91 82  Resp:  20  Temp: 97.6 F (36.4 C) 98.2 F (36.8 C)  SpO2: 97% 97%    General: Alert, awake and following commands appropriately.  Reports no further episode of vomiting and only 2 loose stools in the last 36 hours.  Still having some intermittent abdominal discomfort but much improved.  Feeling ready to go home. Cardiovascular: S1 and S2, no rubs, no gallops, no JVD. Respiratory: Good air movement bilaterally, no requiring oxygen supplementation. No wheezing or crackles on examination. Abdomen: Soft, no guarding, positive bowel sounds, mild tenderness to palpation midepigastric region. Extremities: No cyanosis or clubbing.  Discharge Instructions   Discharge Instructions    Diet - low sodium heart healthy   Complete by: As directed    Diet Carb Modified   Complete by: As directed    Discharge instructions   Complete by: As directed    Arrange follow-up with PCP in 10 days Follow-up with gastroenterology service (Dr. Carlean Purl) as previously  instructed. Follow soft/low residue and low-fat diet.   Maintain adequate hydration Take medications as prescribed  Increase activity slowly   Complete by: As directed      Allergies as of 01/12/2021      Reactions   Duloxetine Hcl Swelling   Fluocinolone Swelling   Acetaminophen Itching, Swelling   Pt states "some swelling"   Amlodipine Besylate Swelling   Swelling of feet, fluid retention   Ciprofloxacin Nausea And Vomiting, Swelling   Swelling of face, jaw, and lips   Hydrocodone-acetaminophen Itching   Metformin Other (See Comments)   REACTION: GI UPSET   Nsaids Other (See Comments)   "HAS BLEEDING ULCERS"   Quinolones Swelling   Sulfamethoxazole Swelling   Swelling of feet, legs   Valsartan Swelling   Cozaar [losartan] Swelling, Rash   Lisinopril Swelling, Rash   Oral swelling, and red streaks on arms/stomach   Tape Itching, Rash   Paper tape can only be used on this patient      Medication List    STOP taking these medications   allopurinol 100 MG tablet Commonly known as: ZYLOPRIM   amLODipine 5 MG tablet Commonly known as: NORVASC   aspirin EC 81 MG tablet   hydrALAZINE 100 MG tablet Commonly known as: APRESOLINE   indomethacin 25 MG capsule Commonly known as: INDOCIN   methocarbamol 500 MG tablet Commonly known as: ROBAXIN   ondansetron 4 MG disintegrating tablet Commonly known as: Zofran ODT     TAKE these medications   acetaminophen 650 MG CR tablet Commonly known as: TYLENOL Take 650 mg by mouth every 8 (eight) hours as needed for pain.   atorvastatin 80 MG tablet Commonly known as: LIPITOR Take 80 mg by mouth every morning.   butalbital-acetaminophen-caffeine 50-325-40 MG tablet Commonly known as: FIORICET Take 1 tablet by mouth every 6 (six) hours as needed for migraine.   calcium carbonate 1250 (500 Ca) MG chewable tablet Commonly known as: OS-CAL Chew 2 tablets by mouth daily.   citalopram 20 MG tablet Commonly known as:  CELEXA Take 1 tablet (20 mg total) by mouth daily. What changed:   medication strength  how much to take   Creon 36000 UNITS Cpep capsule Generic drug: lipase/protease/amylase Take 2 capsules (72,000 Units total) by mouth 3 (three) times daily. What changed: Another medication with the same name was added. Make sure you understand how and when to take each.   lipase/protease/amylase 36000 UNITS Cpep capsule Commonly known as: CREON Take 1 capsule (36,000 Units total) by mouth with snacks. What changed: You were already taking a medication with the same name, and this prescription was added. Make sure you understand how and when to take each.   cyanocobalamin 1000 MCG/ML injection Commonly known as: (VITAMIN B-12) Inject 1,000 mcg into the muscle every 30 (thirty) days.   dicyclomine 20 MG tablet Commonly known as: BENTYL Take 1 tablet (20 mg total) by mouth 3 (three) times daily before meals. What changed: when to take this   diphenoxylate-atropine 2.5-0.025 MG tablet Commonly known as: Lomotil 1-2 tablets qid prn diarrhea Instead of Imodium   esomeprazole 40 MG capsule Commonly known as: NEXIUM Take 40 mg by mouth daily before breakfast.   fenofibrate 145 MG tablet Commonly known as: Tricor Take 1 tablet (145 mg total) by mouth daily.   fluconazole 150 MG tablet Commonly known as: DIFLUCAN Take 1 tablet (150 mg total) by mouth daily for 3 doses. What changed: when to take this   furosemide 40 MG tablet Commonly known as: LASIX Take 1 tablet (40 mg total) by mouth daily. What  changed: when to take this   gabapentin 300 MG capsule Commonly known as: NEURONTIN Take 300 mg by mouth 3 (three) times daily.   HUMALOG KWIKPEN [place] Inject 21-23 Units into the skin 3 (three) times daily with meals. Sliding scale   Humira Pen 40 MG/0.4ML Pnkt Generic drug: Adalimumab Inject 40 mg into the skin every 14 (fourteen) days. After the completion of the starter kit Start  taking on: January 16, 2021 What changed: Another medication with the same name was removed. Continue taking this medication, and follow the directions you see here.   hydrocortisone 2.5 % cream Apply topically 2 (two) times daily.   labetalol 300 MG tablet Commonly known as: NORMODYNE Take 1 tablet (300 mg total) by mouth 2 (two) times daily.   Levemir FlexTouch 100 UNIT/ML FlexPen Generic drug: insulin detemir Inject 20 Units into the skin at bedtime. Patient adjust for her sugars What changed:   how much to take  additional instructions   meclizine 25 MG tablet Commonly known as: ANTIVERT Take 1 tablet by mouth as needed.   nystatin 100000 UNIT/ML suspension Commonly known as: MYCOSTATIN Take 20 mLs by mouth as needed.   omega-3 acid ethyl esters 1 g capsule Commonly known as: LOVAZA Take 2 capsules by mouth 2 (two) times daily.   ondansetron 4 MG tablet Commonly known as: ZOFRAN Take 4 mg by mouth every 8 (eight) hours as needed for nausea.   Ozempic (0.25 or 0.5 MG/DOSE) 2 MG/1.5ML Sopn Generic drug: Semaglutide(0.25 or 0.5MG/DOS) Inject 2 mg into the skin every Sunday.   potassium chloride SA 20 MEQ tablet Commonly known as: KLOR-CON Take 20 mEq by mouth every morning.   promethazine 25 MG tablet Commonly known as: PHENERGAN Take 1 tablet (25 mg total) by mouth every 8 (eight) hours as needed for refractory nausea / vomiting. What changed:   when to take this  reasons to take this   Retacrit 3000 UNIT/ML injection Generic drug: epoetin alfa-epbx Inject 3,000 Units into the skin every 14 (fourteen) days.   tiZANidine 4 MG tablet Commonly known as: ZANAFLEX Take 8 mg by mouth at bedtime.   venlafaxine XR 75 MG 24 hr capsule Commonly known as: EFFEXOR-XR Take 75 mg by mouth daily.   vitamin C with rose hips 500 MG tablet Take 500 mg by mouth every morning.   Vitamin D 50 MCG (2000 UT) Caps Take 2,000 Units by mouth every morning.   zolpidem 10 MG  tablet Commonly known as: AMBIEN Take 10 mg by mouth at bedtime.      Allergies  Allergen Reactions  . Duloxetine Hcl Swelling  . Fluocinolone Swelling  . Acetaminophen Itching and Swelling    Pt states "some swelling"  . Amlodipine Besylate Swelling    Swelling of feet, fluid retention  . Ciprofloxacin Nausea And Vomiting and Swelling    Swelling of face, jaw, and lips  . Hydrocodone-Acetaminophen Itching  . Metformin Other (See Comments)    REACTION: GI UPSET  . Nsaids Other (See Comments)    "HAS BLEEDING ULCERS"  . Quinolones Swelling  . Sulfamethoxazole Swelling    Swelling of feet, legs  . Valsartan Swelling  . Cozaar [Losartan] Swelling and Rash  . Lisinopril Swelling and Rash    Oral swelling, and red streaks on arms/stomach  . Tape Itching and Rash    Paper tape can only be used on this patient    Follow-up Information    [name], MD. Go on 01/19/2021.  Specialty: Internal Medicine Why: Hospital follow up 2:00pm Contact information: [place] [address] [phone]        Gatha Mayer, MD. Schedule an appointment as soon as possible for a visit in 2 week(s).   Specialty: Gastroenterology Contact information: [address] [place] [address] [phone]               The results of significant diagnostics from this hospitalization (including imaging, microbiology, ancillary and laboratory) are listed below for reference.    Significant Diagnostic Studies: CT ABDOMEN PELVIS WO CONTRAST  Result Date: 01/07/2021 CLINICAL DATA:  Abdominal pain, Crohn's disease with worsening pain, nausea, vomiting, diarrhea EXAM: CT ABDOMEN AND PELVIS WITHOUT CONTRAST TECHNIQUE: Multidetector CT imaging of the abdomen and pelvis was performed following the standard protocol without IV contrast. Oral enteric contrast was administered. COMPARISON:  12/22/2020 FINDINGS: Lower chest: No acute abnormality. Stable, benign small pulmonary nodules of  the left lung base. Coronary artery calcifications. Hepatobiliary: No focal liver abnormality is seen. Status post cholecystectomy. No biliary dilatation. Pancreas: There is new thickening and fat stranding about the pancreas, particularly the pancreatic tail (series 2, image 25). No pancreatic ductal dilatation. Spleen: Normal in size without significant abnormality. Adrenals/Urinary Tract: Adrenal glands are unremarkable. Kidneys are normal, without renal calculi, solid lesion, or hydronephrosis. Bladder is unremarkable. Stomach/Bowel: Stomach is within normal limits. Appendix is not clearly visualized and may be surgically absent. The colon is fluid-filled. No evidence of bowel wall thickening, distention, or inflammatory changes. Vascular/Lymphatic: Aortic atherosclerosis. No enlarged abdominal or pelvic lymph nodes. Reproductive: Status post hysterectomy. Other: Left lower quadrant hernia mesh repair. No abdominopelvic ascites. Musculoskeletal: No acute or significant osseous findings. IMPRESSION: 1. There is new thickening and fat stranding about the pancreas, particularly the pancreatic tail, consistent with acute pancreatitis. No evidence of acute pancreatic fluid collection on this noncontrast examination. 2. The colon is fluid-filled, consistent with diarrheal illness. No evidence of bowel obstruction. 3. Status post cholecystectomy, hysterectomy, and left lower quadrant hernia mesh repair. 4. Coronary artery disease. Aortic Atherosclerosis (ICD10-I70.0). Electronically Signed   By: Eddie Candle M.D.   On: 01/07/2021 15:48   DG CHEST PORT 1 VIEW  Result Date: 01/10/2021 CLINICAL DATA:  Dyspnea with exertion, cough. EXAM: PORTABLE CHEST 1 VIEW COMPARISON:  January 13, 2020. FINDINGS: The heart size and mediastinal contours are within normal limits. Both lungs are clear. No pneumothorax or pleural effusion is noted. The visualized skeletal structures are unremarkable. IMPRESSION: No active disease.  Electronically Signed   By: Marijo Conception M.D.   On: 01/10/2021 16:35    Microbiology: Recent Results (from the past 240 hour(s))  Resp Panel by RT-PCR (Flu A&B, Covid) Nasopharyngeal Swab     Status: None   Collection Time: 01/06/21  4:02 PM   Specimen: Nasopharyngeal Swab; Nasopharyngeal(NP) swabs in vial transport medium  Result Value Ref Range Status   SARS Coronavirus 2 by RT PCR NEGATIVE NEGATIVE Final    Comment: (NOTE) SARS-CoV-2 target nucleic acids are NOT DETECTED.  The SARS-CoV-2 RNA is generally detectable in upper respiratory specimens during the acute phase of infection. The lowest concentration of SARS-CoV-2 viral copies this assay can detect is 138 copies/mL. A negative result does not preclude SARS-Cov-2 infection and should not be used as the sole basis for treatment or other patient management decisions. A negative result may occur with  improper specimen collection/handling, submission of specimen other than nasopharyngeal swab, presence of viral mutation(s) within the areas  targeted by this assay, and inadequate number of viral copies(<138 copies/mL). A negative result must be combined with clinical observations, patient history, and epidemiological information. The expected result is Negative.  Fact Sheet for Patients:  EntrepreneurPulse.com.au  Fact Sheet for Healthcare Providers:  IncredibleEmployment.be  This test is no t yet approved or cleared by the [place] FDA and  has been authorized for detection and/or diagnosis of SARS-CoV-2 by FDA under an Emergency Use Authorization (EUA). This EUA will remain  in effect (meaning this test can be used) for the duration of the COVID-19 declaration under Section 564(b)(1) of the Act, 21 U.S.C.section 360bbb-3(b)(1), unless the authorization is terminated  or revoked sooner.       Influenza A by PCR NEGATIVE NEGATIVE Final   Influenza B by PCR NEGATIVE NEGATIVE  Final    Comment: (NOTE) The Xpert Xpress SARS-CoV-2/FLU/RSV plus assay is intended as an aid in the diagnosis of influenza from Nasopharyngeal swab specimens and should not be used as a sole basis for treatment. Nasal washings and aspirates are unacceptable for Xpert Xpress SARS-CoV-2/FLU/RSV testing.  Fact Sheet for Patients: EntrepreneurPulse.com.au  Fact Sheet for Healthcare Providers: IncredibleEmployment.be  This test is not yet approved or cleared by the [place] FDA and has been authorized for detection and/or diagnosis of SARS-CoV-2 by FDA under an Emergency Use Authorization (EUA). This EUA will remain in effect (meaning this test can be used) for the duration of the COVID-19 declaration under Section 564(b)(1) of the Act, 21 U.S.C. section 360bbb-3(b)(1), unless the authorization is terminated or revoked.  Performed at [facility], [address] [place], [place] [address]   Gastrointestinal Panel by PCR , Stool     Status: Abnormal   Collection Time: 01/07/21  2:50 PM   Specimen: STOOL  Result Value Ref Range Status   Campylobacter species NOT DETECTED NOT DETECTED Final   Plesimonas shigelloides NOT DETECTED NOT DETECTED Final   Salmonella species NOT DETECTED NOT DETECTED Final   Yersinia enterocolitica NOT DETECTED NOT DETECTED Final   Vibrio species NOT DETECTED NOT DETECTED Final   Vibrio cholerae NOT DETECTED NOT DETECTED Final   Enteroaggregative E coli (EAEC) DETECTED (A) NOT DETECTED Final    Comment: RESULT CALLED TO, READ BACK BY AND VERIFIED WITH: LAUREN COLEMAN 01/08/21 AT 1555 BY ACR    Enteropathogenic E coli (EPEC) NOT DETECTED NOT DETECTED Final   Enterotoxigenic E coli (ETEC) NOT DETECTED NOT DETECTED Final   Shiga like toxin producing E coli (STEC) NOT DETECTED NOT DETECTED Final   Shigella/Enteroinvasive E coli (EIEC) NOT DETECTED NOT DETECTED Final   Cryptosporidium NOT DETECTED NOT DETECTED Final    Cyclospora cayetanensis NOT DETECTED NOT DETECTED Final   Entamoeba histolytica NOT DETECTED NOT DETECTED Final   Giardia lamblia NOT DETECTED NOT DETECTED Final   Adenovirus F40/41 NOT DETECTED NOT DETECTED Final   Astrovirus NOT DETECTED NOT DETECTED Final   Norovirus GI/GII NOT DETECTED NOT DETECTED Final   Rotavirus A NOT DETECTED NOT DETECTED Final   Sapovirus (I, II, IV, and V) NOT DETECTED NOT DETECTED Final    Comment: Performed at [facility], [address] [place], [place] [address]     Labs: Basic Metabolic Panel: Recent Labs  Lab 01/06/21 1403 01/07/21 0611 01/08/21 0524 01/09/21 0900 01/10/21 0632 01/11/21 0523  NA 134* 136 139 131* 137 142  K 2.3* 3.8 3.8 5.2* 5.4* 4.8  CL 94* 106 109 107 110 114*  CO2 24 19* 16* 15* 18*  18*  GLUCOSE 100* 289* 231* 244* 250* 67*  BUN 28* 23 23 28* 32* 30*  CREATININE 2.04* 1.80* 1.84* 1.97* 1.75* 1.62*  CALCIUM 9.3 8.1* 8.7* 8.8* 9.0 9.5  MG 1.3* 1.4*  --   --   --   --    Liver Function Tests: Recent Labs  Lab 01/06/21 1403 01/08/21 0524 01/09/21 0900 01/10/21 0632 01/11/21 0523  AST '18 20 22 22 23  ' ALT '15 11 15 15 16  ' ALKPHOS 91 74 68 62 60  BILITOT 0.2* 0.1* 0.3 0.4 0.2*  PROT 7.7 6.4* 6.1* 5.6* 6.1*  ALBUMIN 3.7 3.2* 3.0* 2.7* 2.9*   Recent Labs  Lab 01/07/21 1607 01/09/21 0900 01/10/21 0632  LIPASE 164* 127* 87*   CBC: Recent Labs  Lab 01/06/21 1403 01/07/21 0611 01/08/21 0524 01/09/21 0900 01/10/21 0632 01/11/21 0523 01/12/21 0521  WBC 12.9*   < > 8.3 9.4 8.1 7.4 5.3  NEUTROABS 8.7*  --   --   --   --   --   --   HGB 10.2*   < > 8.7* 7.7* 7.2* 7.1* 7.4*  HCT 30.3*   < > 28.6* 24.8* 22.3* 23.1* 25.5*  MCV 89.6   < > 97.3 99.2 96.1 98.3 102.4*  PLT 310   < > 219 158 167 187 199   < > = values in this interval not displayed.   CBG: Recent Labs  Lab 01/11/21 2128 01/11/21 2256 01/12/21 0726 01/12/21 1116 01/12/21 1203  GLUCAP 344* 330* 80 169* 172*    Signed:  Barton Dubois MD.  [facility] 01/12/2021, 4:15 PM

## 2021-01-12 NOTE — Plan of Care (Signed)
  Problem: Education: Goal: Knowledge of General Education information will improve Description: Including pain rating scale, medication(s)/side effects and non-pharmacologic comfort measures 01/12/2021 1635 by Mellody Drown, LPN Outcome: Completed/Met 01/12/2021 0948 by Mellody Drown, LPN Outcome: Progressing   Problem: Health Behavior/Discharge Planning: Goal: Ability to manage health-related needs will improve 01/12/2021 1635 by Mellody Drown, LPN Outcome: Completed/Met 01/12/2021 0948 by Mellody Drown, LPN Outcome: Progressing   Problem: Clinical Measurements: Goal: Ability to maintain clinical measurements within normal limits will improve 01/12/2021 1635 by Mellody Drown, LPN Outcome: Completed/Met 01/12/2021 0948 by Mellody Drown, LPN Outcome: Progressing Goal: Will remain free from infection 01/12/2021 1635 by Mellody Drown, LPN Outcome: Completed/Met 01/12/2021 0948 by Rise Patience A, LPN Outcome: Progressing Goal: Diagnostic test results will improve 01/12/2021 1635 by Mellody Drown, LPN Outcome: Completed/Met 01/12/2021 0948 by Rise Patience A, LPN Outcome: Progressing Goal: Respiratory complications will improve 01/12/2021 1635 by Mellody Drown, LPN Outcome: Completed/Met 01/12/2021 0948 by Mellody Drown, LPN Outcome: Progressing Goal: Cardiovascular complication will be avoided 01/12/2021 1635 by Mellody Drown, LPN Outcome: Completed/Met 01/12/2021 0948 by Mellody Drown, LPN Outcome: Progressing   Problem: Activity: Goal: Risk for activity intolerance will decrease 01/12/2021 1635 by Mellody Drown, LPN Outcome: Completed/Met 01/12/2021 0948 by Mellody Drown, LPN Outcome: Progressing   Problem: Nutrition: Goal: Adequate nutrition will be maintained 01/12/2021 1635 by Mellody Drown, LPN Outcome: Completed/Met 01/12/2021 0948 by Mellody Drown, LPN Outcome: Progressing   Problem: Coping: Goal: Level  of anxiety will decrease 01/12/2021 1635 by Mellody Drown, LPN Outcome: Completed/Met 01/12/2021 0948 by Mellody Drown, LPN Outcome: Progressing   Problem: Elimination: Goal: Will not experience complications related to bowel motility 01/12/2021 1635 by Mellody Drown, LPN Outcome: Completed/Met 01/12/2021 0948 by Mellody Drown, LPN Outcome: Progressing Goal: Will not experience complications related to urinary retention 01/12/2021 1635 by Mellody Drown, LPN Outcome: Completed/Met 01/12/2021 0948 by Mellody Drown, LPN Outcome: Progressing   Problem: Pain Managment: Goal: General experience of comfort will improve 01/12/2021 1635 by Mellody Drown, LPN Outcome: Completed/Met 01/12/2021 0948 by Mellody Drown, LPN Outcome: Progressing   Problem: Safety: Goal: Ability to remain free from injury will improve 01/12/2021 1635 by Mellody Drown, LPN Outcome: Completed/Met 01/12/2021 0948 by Mellody Drown, LPN Outcome: Progressing   Problem: Skin Integrity: Goal: Risk for impaired skin integrity will decrease 01/12/2021 1635 by Mellody Drown, LPN Outcome: Completed/Met 01/12/2021 0948 by Mellody Drown, LPN Outcome: Progressing

## 2021-01-12 NOTE — Progress Notes (Signed)
Pt has been 24h without altered behavior; received verbal order from MD to d/c sitter.

## 2021-01-13 ENCOUNTER — Ambulatory Visit (HOSPITAL_COMMUNITY): Payer: 59

## 2021-01-13 ENCOUNTER — Other Ambulatory Visit: Payer: Self-pay

## 2021-01-13 ENCOUNTER — Encounter (HOSPITAL_COMMUNITY)
Admission: RE | Admit: 2021-01-13 | Discharge: 2021-01-13 | Disposition: A | Discharge status: Home / Self Care | Payer: 59 | Source: Ambulatory Visit | Attending: Nephrology | Admitting: Nephrology

## 2021-01-13 ENCOUNTER — Encounter (HOSPITAL_COMMUNITY): Payer: Self-pay

## 2021-01-13 DIAGNOSIS — N183 Chronic kidney disease, stage 3 unspecified: Secondary | ICD-10-CM

## 2021-01-13 LAB — GLUCOSE, CAPILLARY: Glucose-Capillary: 133 mg/dL — ABNORMAL HIGH (ref 70–99)

## 2021-01-18 ENCOUNTER — Other Ambulatory Visit: Payer: Self-pay | Admitting: *Deleted

## 2021-01-18 NOTE — Patient Outreach (Signed)
[place] [facility]) Care Management  01/18/2021  LINDE WILENSKY 06/16/1951 [id number]   Referral received 3/21 Initial outreach 3/23 Transition of care to be completed by primary provider  Telephone Assessment-Unsuccessful   RN attempted outreach call today however unsuccessful. RN able to leave  HIPAA approved voice message requesting a call back. Will further engage at that time.  Will attempted another outreach call over the next week.  Raina Mina, RN Care Management Coordinator [place] Office [phone]

## 2021-01-19 ENCOUNTER — Encounter (HOSPITAL_COMMUNITY): Payer: Self-pay

## 2021-01-19 ENCOUNTER — Encounter (HOSPITAL_COMMUNITY)
Admission: RE | Admit: 2021-01-19 | Discharge: 2021-01-19 | Disposition: A | Discharge status: Home / Self Care | Payer: 59 | Source: Ambulatory Visit | Attending: Nephrology | Admitting: Nephrology

## 2021-01-19 ENCOUNTER — Other Ambulatory Visit: Payer: Self-pay

## 2021-01-19 DIAGNOSIS — D631 Anemia in chronic kidney disease: Secondary | ICD-10-CM | POA: Insufficient documentation

## 2021-01-19 DIAGNOSIS — N184 Chronic kidney disease, stage 4 (severe): Secondary | ICD-10-CM | POA: Insufficient documentation

## 2021-01-19 LAB — POCT HEMOGLOBIN-HEMACUE: Hemoglobin: 10 g/dL — ABNORMAL LOW (ref 12.0–15.0)

## 2021-01-19 MED ORDER — EPOETIN ALFA-EPBX 3000 UNIT/ML IJ SOLN: 3000.0000 [IU] | Freq: Once | INTRAMUSCULAR | Status: DC

## 2021-01-20 ENCOUNTER — Ambulatory Visit (HOSPITAL_COMMUNITY): Payer: 59

## 2021-01-24 ENCOUNTER — Other Ambulatory Visit: Payer: Self-pay | Admitting: *Deleted

## 2021-01-24 ENCOUNTER — Telehealth: Payer: Self-pay | Admitting: Internal Medicine

## 2021-01-24 NOTE — Telephone Encounter (Signed)
Pt was hospitalized in [facility] ED on 3/11 to 3/17 for crohn's flare up. She is requesting to be seen asap for f/u.

## 2021-01-24 NOTE — Telephone Encounter (Signed)
Please put her in one of the blocked spots on APP at 1:30 on Friday

## 2021-01-24 NOTE — Patient Outreach (Signed)
[place] [facility]) Care Management  01/24/2021  CYRENE GHARIBIAN 1951/07/02 [id number]    Telephone Assessment  Outreach #2 RN attempted outreach today however unsuccessful. RN able to leave a HIPAA approved voice message requesting a call back.  Will scheduled another outreach over the next week for pending [facility] services.  Raina Mina, RN Care Management Coordinator [place] Office [phone]

## 2021-01-25 NOTE — Telephone Encounter (Signed)
Ov scheduled for 01/27/21 at 1:30pm with Colleen.

## 2021-01-27 ENCOUNTER — Ambulatory Visit: Payer: Medicaid Other | Admitting: Nurse Practitioner

## 2021-01-30 ENCOUNTER — Other Ambulatory Visit: Payer: Self-pay | Admitting: *Deleted

## 2021-01-30 NOTE — Patient Outreach (Signed)
[place] [facility]) Care Management  01/30/2021  Heather [name] Feb 12, 1951 [id number]   Telephone Assessment-Successful-Declined [facility]  RN spoke with pt today and introduced [facility] services and the purpose for today's call. Pt screen and medications were reviewed. Pt states she has already had a follow up appointment with her provider. Discussed her diabetes at pt states her A1c is 7.5 and her glucose readings flucuate from the 100's to the low 200's. RN offered [facility] services to assist with managing this condition however pt opt to decline. Further expressed the importance and risk involved if this condition is not managed. Pt verbalized awareness and states she is making some changes in his diet to improve. Again informed pt of the available services with [facility] for pharmacy, social workers and nursing for care management services via benefit through her [facility] Pt very appreciative and again declined [facility] services at this time. RN also offered to mail pt information packet about [facility]N services, pt declined.  Based upon pt's decline for [facility] services will alert her provider and close this case from further contacts at this time. No additional needs presented during this call.  Heather Mina, RN Care Management Coordinator [place] Office [phone]

## 2021-01-31 ENCOUNTER — Other Ambulatory Visit: Payer: Self-pay | Admitting: Physician Assistant

## 2021-01-31 DIAGNOSIS — R112 Nausea with vomiting, unspecified: Secondary | ICD-10-CM

## 2021-02-02 ENCOUNTER — Other Ambulatory Visit: Payer: Self-pay

## 2021-02-02 ENCOUNTER — Encounter (HOSPITAL_COMMUNITY)
Admission: RE | Admit: 2021-02-02 | Discharge: 2021-02-02 | Disposition: A | Discharge status: Home / Self Care | Payer: 59 | Source: Ambulatory Visit | Attending: Nephrology | Admitting: Nephrology

## 2021-02-02 ENCOUNTER — Encounter (HOSPITAL_COMMUNITY): Payer: Self-pay

## 2021-02-02 ENCOUNTER — Encounter (HOSPITAL_COMMUNITY): Admission: RE | Admit: 2021-02-02 | Payer: 59 | Source: Ambulatory Visit

## 2021-02-02 DIAGNOSIS — D631 Anemia in chronic kidney disease: Secondary | ICD-10-CM | POA: Diagnosis not present

## 2021-02-02 DIAGNOSIS — N184 Chronic kidney disease, stage 4 (severe): Secondary | ICD-10-CM | POA: Diagnosis present

## 2021-02-02 LAB — CBC
HCT: 32.1 % — ABNORMAL LOW (ref 36.0–46.0)
Hemoglobin: 10.6 g/dL — ABNORMAL LOW (ref 12.0–15.0)
MCH: 30.5 pg (ref 26.0–34.0)
MCHC: 33 g/dL (ref 30.0–36.0)
MCV: 92.2 fL (ref 80.0–100.0)
Platelets: 185 10*3/uL (ref 150–400)
RBC: 3.48 MIL/uL — ABNORMAL LOW (ref 3.87–5.11)
RDW: 14.3 % (ref 11.5–15.5)
WBC: 7.1 10*3/uL (ref 4.0–10.5)
nRBC: 0 % (ref 0.0–0.2)

## 2021-02-02 LAB — RENAL FUNCTION PANEL
Albumin: 3.8 g/dL (ref 3.5–5.0)
Anion gap: 12 (ref 5–15)
BUN: 26 mg/dL — ABNORMAL HIGH (ref 8–23)
CO2: 22 mmol/L (ref 22–32)
Calcium: 9.3 mg/dL (ref 8.9–10.3)
Chloride: 97 mmol/L — ABNORMAL LOW (ref 98–111)
Creatinine, Ser: 1.72 mg/dL — ABNORMAL HIGH (ref 0.44–1.00)
GFR, Estimated: 32 mL/min — ABNORMAL LOW (ref >60)
Glucose, Bld: 463 mg/dL — ABNORMAL HIGH (ref 70–99)
Phosphorus: 3.4 mg/dL (ref 2.5–4.6)
Potassium: 4.4 mmol/L (ref 3.5–5.1)
Sodium: 131 mmol/L — ABNORMAL LOW (ref 135–145)

## 2021-02-02 LAB — IRON AND TIBC
Iron: 80 ug/dL (ref 28–170)
Saturation Ratios: 26 % (ref 10.4–31.8)
TIBC: 312 ug/dL (ref 250–450)
UIBC: 232 ug/dL

## 2021-02-02 LAB — PROTEIN / CREATININE RATIO, URINE
Creatinine, Urine: 68.39 mg/dL
Protein Creatinine Ratio: 2.06 mg/mg{Cre} — ABNORMAL HIGH (ref 0.00–0.15)
Total Protein, Urine: 141 mg/dL

## 2021-02-02 MED ORDER — EPOETIN ALFA-EPBX 3000 UNIT/ML IJ SOLN: 3000.0000 [IU] | Freq: Once | INTRAMUSCULAR | Status: DC

## 2021-02-03 LAB — PARATHYROID HORMONE, INTACT (NO CA): PTH: 83 pg/mL — ABNORMAL HIGH (ref 15–65)

## 2021-02-09 LAB — POCT HEMOGLOBIN-HEMACUE: Hemoglobin: 10.3 g/dL — ABNORMAL LOW (ref 12.0–15.0)

## 2021-02-10 DIAGNOSIS — M810 Age-related osteoporosis without current pathological fracture: Secondary | ICD-10-CM | POA: Insufficient documentation

## 2021-02-16 ENCOUNTER — Encounter (HOSPITAL_COMMUNITY)
Admission: RE | Admit: 2021-02-16 | Discharge: 2021-02-16 | Disposition: A | Discharge status: Home / Self Care | Payer: 59 | Source: Ambulatory Visit | Attending: Nephrology | Admitting: Nephrology

## 2021-02-16 ENCOUNTER — Other Ambulatory Visit: Payer: Self-pay

## 2021-02-16 DIAGNOSIS — N184 Chronic kidney disease, stage 4 (severe): Secondary | ICD-10-CM | POA: Diagnosis not present

## 2021-02-16 LAB — POCT HEMOGLOBIN-HEMACUE: Hemoglobin: 11.8 g/dL — ABNORMAL LOW (ref 12.0–15.0)

## 2021-02-16 MED ORDER — CLONIDINE HCL 0.1 MG PO TABS
0.1000 mg | ORAL_TABLET | Freq: Once | ORAL | Status: AC
  Administered 2021-02-16: 0.1 mg via ORAL
  Filled 2021-02-16: qty 1

## 2021-02-20 ENCOUNTER — Encounter: Payer: Self-pay | Admitting: Nurse Practitioner

## 2021-02-20 ENCOUNTER — Ambulatory Visit (INDEPENDENT_AMBULATORY_CARE_PROVIDER_SITE_OTHER): Payer: 59 | Admitting: Nurse Practitioner

## 2021-02-20 ENCOUNTER — Other Ambulatory Visit: Payer: Self-pay

## 2021-02-20 VITALS — BP 143/78 | HR 85 | Ht 62.0 in | Wt 132.0 lb

## 2021-02-20 DIAGNOSIS — E1121 Type 2 diabetes mellitus with diabetic nephropathy: Secondary | ICD-10-CM

## 2021-02-20 DIAGNOSIS — I10 Essential (primary) hypertension: Secondary | ICD-10-CM | POA: Diagnosis not present

## 2021-02-20 DIAGNOSIS — E782 Mixed hyperlipidemia: Secondary | ICD-10-CM

## 2021-02-20 MED ORDER — LEVEMIR FLEXTOUCH 100 UNIT/ML ~~LOC~~ SOPN: 40.0000 [IU] | PEN_INJECTOR | Freq: Every day | SUBCUTANEOUS | Status: DC

## 2021-02-20 NOTE — Patient Instructions (Signed)
Diabetes Mellitus and Nutrition, Adult When you have diabetes, or diabetes mellitus, it is very important to have healthy eating habits because your blood sugar (glucose) levels are greatly affected by what you eat and drink. Eating healthy foods in the right amounts, at about the same times every day, can help you:  Control your blood glucose.  Lower your risk of heart disease.  Improve your blood pressure.  Reach or maintain a healthy weight. What can affect my meal plan? Every person with diabetes is different, and each person has different needs for a meal plan. Your health care provider may recommend that you work with a dietitian to make a meal plan that is best for you. Your meal plan may vary depending on factors such as:  The calories you need.  The medicines you take.  Your weight.  Your blood glucose, blood pressure, and cholesterol levels.  Your activity level.  Other health conditions you have, such as heart or kidney disease. How do carbohydrates affect me? Carbohydrates, also called carbs, affect your blood glucose level more than any other type of food. Eating carbs naturally raises the amount of glucose in your blood. Carb counting is a method for keeping track of how many carbs you eat. Counting carbs is important to keep your blood glucose at a healthy level, especially if you use insulin or take certain oral diabetes medicines. It is important to know how many carbs you can safely have in each meal. This is different for every person. Your dietitian can help you calculate how many carbs you should have at each meal and for each snack. How does alcohol affect me? Alcohol can cause a sudden decrease in blood glucose (hypoglycemia), especially if you use insulin or take certain oral diabetes medicines. Hypoglycemia can be a life-threatening condition. Symptoms of hypoglycemia, such as sleepiness, dizziness, and confusion, are similar to symptoms of having too much  alcohol.  Do not drink alcohol if: ? Your health care provider tells you not to drink. ? You are pregnant, may be pregnant, or are planning to become pregnant.  If you drink alcohol: ? Do not drink on an empty stomach. ? Limit how much you use to:  0-1 drink a day for women.  0-2 drinks a day for men. ? Be aware of how much alcohol is in your drink. In the [place] one drink equals one 12 oz bottle of beer (355 mL), one 5 oz glass of wine (148 mL), or one 1 oz glass of hard liquor (44 mL). ? Keep yourself hydrated with water, diet soda, or unsweetened iced tea.  Keep in mind that regular soda, juice, and other mixers may contain a lot of sugar and must be counted as carbs. What are tips for following this plan? Reading food labels  Start by checking the serving size on the "Nutrition Facts" label of packaged foods and drinks. The amount of calories, carbs, fats, and other nutrients listed on the label is based on one serving of the item. Many items contain more than one serving per package.  Check the total grams (g) of carbs in one serving. You can calculate the number of servings of carbs in one serving by dividing the total carbs by 15. For example, if a food has 30 g of total carbs per serving, it would be equal to 2 servings of carbs.  Check the number of grams (g) of saturated fats and trans fats in one serving. Choose foods that have   a low amount or none of these fats.  Check the number of milligrams (mg) of salt (sodium) in one serving. Most people should limit total sodium intake to less than 2,300 mg per day.  Always check the nutrition information of foods labeled as "low-fat" or "nonfat." These foods may be higher in added sugar or refined carbs and should be avoided.  Talk to your dietitian to identify your daily goals for nutrients listed on the label. Shopping  Avoid buying canned, pre-made, or processed foods. These foods tend to be high in fat, sodium, and added  sugar.  Shop around the outside edge of the grocery store. This is where you will most often find fresh fruits and vegetables, bulk grains, fresh meats, and fresh dairy. Cooking  Use low-heat cooking methods, such as baking, instead of high-heat cooking methods like deep frying.  Cook using healthy oils, such as olive, canola, or sunflower oil.  Avoid cooking with butter, cream, or high-fat meats. Meal planning  Eat meals and snacks regularly, preferably at the same times every day. Avoid going long periods of time without eating.  Eat foods that are high in fiber, such as fresh fruits, vegetables, beans, and whole grains. Talk with your dietitian about how many servings of carbs you can eat at each meal.  Eat 4-6 oz (112-168 g) of lean protein each day, such as lean meat, chicken, fish, eggs, or tofu. One ounce (oz) of lean protein is equal to: ? 1 oz (28 g) of meat, chicken, or fish. ? 1 egg. ?  cup (62 g) of tofu.  Eat some foods each day that contain healthy fats, such as avocado, nuts, seeds, and fish.   What foods should I eat? Fruits Berries. Apples. Oranges. Peaches. Apricots. Plums. Grapes. Mango. Papaya. Pomegranate. Kiwi. Cherries. Vegetables Lettuce. Spinach. Leafy greens, including kale, chard, collard greens, and mustard greens. Beets. Cauliflower. Cabbage. Broccoli. Carrots. Green beans. Tomatoes. Peppers. Onions. Cucumbers. Brussels sprouts. Grains Whole grains, such as whole-wheat or whole-grain bread, crackers, tortillas, cereal, and pasta. Unsweetened oatmeal. Quinoa. Brown or wild rice. Meats and other proteins Seafood. Poultry without skin. Lean cuts of poultry and beef. Tofu. Nuts. Seeds. Dairy Low-fat or fat-free dairy products such as milk, yogurt, and cheese. The items listed above may not be a complete list of foods and beverages you can eat. Contact a dietitian for more information. What foods should I avoid? Fruits Fruits canned with  syrup. Vegetables Canned vegetables. Frozen vegetables with butter or cream sauce. Grains Refined white flour and flour products such as bread, pasta, snack foods, and cereals. Avoid all processed foods. Meats and other proteins Fatty cuts of meat. Poultry with skin. Breaded or fried meats. Processed meat. Avoid saturated fats. Dairy Full-fat yogurt, cheese, or milk. Beverages Sweetened drinks, such as soda or iced tea. The items listed above may not be a complete list of foods and beverages you should avoid. Contact a dietitian for more information. Questions to ask a health care provider  Do I need to meet with a diabetes educator?  Do I need to meet with a dietitian?  What number can I call if I have questions?  When are the best times to check my blood glucose? Where to find more information:  American Diabetes Association: diabetes.org  Academy of Nutrition and Dietetics: www.eatright.org  National Institute of Diabetes and Digestive and Kidney Diseases: www.niddk.nih.gov  Association of Diabetes Care and Education Specialists: www.diabeteseducator.org Summary  It is important to have healthy eating   habits because your blood sugar (glucose) levels are greatly affected by what you eat and drink.  A healthy meal plan will help you control your blood glucose and maintain a healthy lifestyle.  Your health care provider may recommend that you work with a dietitian to make a meal plan that is best for you.  Keep in mind that carbohydrates (carbs) and alcohol have immediate effects on your blood glucose levels. It is important to count carbs and to use alcohol carefully. This information is not intended to replace advice given to you by your health care provider. Make sure you discuss any questions you have with your health care provider. Document Revised: 09/22/2019 Document Reviewed: 09/22/2019 Elsevier Patient Education  2021 [employer]  

## 2021-02-20 NOTE — Progress Notes (Signed)
Endocrinology Consult Note       02/20/2021, 9:20 AM   Subjective:    Patient ID: Heather [name], female    DOB: Sep 12, 1951.  Heather [name] is being seen in consultation for management of currently uncontrolled symptomatic diabetes requested by  [name], MD.   Past Medical History:  Diagnosis Date  . Acute renal failure superimposed on stage 3 chronic kidney disease ([place]) 01/21/2016  . Anemia   . Anxiety   . B12 deficiency   . Brain tumor (benign) ([place]) 10/29/2020  . Cellulitis and abscess    Abdomen and buttocks  . CKD (chronic kidney disease) stage 3, GFR 30-59 ml/min (HCC)   . Closed fracture of distal end of femur, unspecified fracture morphology, initial encounter ([place])   . Depression   . Diabetic neuropathy (HCC)    feet  . DJD (degenerative joint disease)    Right forminal stenosis C4-5  . Essential hypertension   . Femur fracture, right ([place]) 03/30/2017  . Folliculitis   . GERD (gastroesophageal reflux disease)   . Gout   . Headache(784.0)   . Hiatal hernia   . History of blood transfusion   . History of bronchitis   . History of cardiac catheterization    No significant CAD 2012  . History of kidney stones    surgery to remove  . Insomnia   . Irritable bowel syndrome   . Mixed hyperlipidemia   . Osteoarthritis   . Palpitations   . Pneumonia   . Reflux esophagitis   . Salmonella   . Stroke ([place])   . Tinnitus    Right  . Type 2 diabetes mellitus ([place])     Past Surgical History:  Procedure Laterality Date  . BACK SURGERY    . c-spine surgery     03/2007  . CARDIAC CATHETERIZATION    . CHOLECYSTECTOMY    . COLONOSCOPY  04/21/2011; 05/29/11   6/12: [place] - ?AVM at IC valve, inflammatory changes at [facility] valve [facility]); 7/12 - [place]; IC valve erosions, look chronic and probably Crohn's per path  . colonoscopy  2017   [place]: random biopsies normal. TI  normal  . ESOPHAGOGASTRODUODENOSCOPY  05/29/11   normal  . ESOPHAGOGASTRODUODENOSCOPY N/A 01/21/2016   Dr. Michail Sermon: normal  . EYE SURGERY Bilateral    cataracts removed  . FEMUR IM NAIL Right 04/10/2018   Procedure: INTRAMEDULLARY (IM) NAIL FEMORAL;  Surgeon: Paralee Cancel, MD;  Location: [facility];  Service: Orthopedics;  Laterality: Right;  . FRACTURE SURGERY    . GIVENS CAPSULE STUDY  11/17/2020  . HARDWARE REMOVAL Right 04/10/2018   Procedure: HARDWARE REMOVAL RIGHT DISTAL FEMUR;  Surgeon: Paralee Cancel, MD;  Location: [facility];  Service: Orthopedics;  Laterality: Right;  . HERNIA REPAIR    . IR FLUORO GUIDE CV LINE RIGHT  04/01/2017  . IR US GUIDE VASC ACCESS RIGHT  04/01/2017  . JOINT REPLACEMENT Right    hip  . OPEN REDUCTION INTERNAL FIXATION (ORIF) DISTAL RADIAL FRACTURE Right 05/28/2019   Procedure: OPEN REDUCTION INTERNAL FIXATION (ORIF) DISTAL RADIAL FRACTURE;  Surgeon: Verner Mould, MD;  Location: [facility];  Service: Orthopedics;  Laterality:  Right;  with block 90 minutes  . ORIF FEMUR FRACTURE Right 03/31/2017   Procedure: OPEN REDUCTION INTERNAL FIXATION (ORIF) DISTAL FEMUR FRACTURE;  Surgeon: Paralee Cancel, MD;  Location: [facility];  Service: Orthopedics;  Laterality: Right;  . removal of kidney stone    . Right knee arthroscopic surgery    . TONSILLECTOMY    . TOTAL ABDOMINAL HYSTERECTOMY      Social History   Socioeconomic History  . Marital status: Married    Spouse name: Not on file  . Number of children: 2  . Years of education: Not on file  . Highest education level: Not on file  Occupational History  . Occupation: Disabled  Tobacco Use  . Smoking status: Never Smoker  . Smokeless tobacco: Never Used  Vaping Use  . Vaping Use: Never used  Substance and Sexual Activity  . Alcohol use: No  . Drug use: No  . Sexual activity: Not on file    Comment: Hysterectomy  Other Topics Concern  . 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    Social Determinants of Health   Financial Resource Strain: Not on file  Food Insecurity: Not on file  Transportation Needs: Not on file  Physical Activity: Not on file  Stress: Not on file  Social Connections: Not on file    Family History  Problem Relation Age of Onset  . Diabetes Mother   . Colon cancer Brother        POSSIBLY. Patient states diagnosed at age 6, then later states age 35. unclear and limited historian  . Esophageal cancer Neg Hx   . Rectal cancer Neg Hx   . Stomach cancer Neg Hx     Outpatient Encounter Medications as of 02/20/2021  Medication Sig  . acetaminophen (TYLENOL) 650 MG CR tablet Take 650 mg by mouth every 8 (eight) hours as needed for pain.  . Adalimumab (HUMIRA PEN) 40 MG/0.4ML PNKT Inject 40 mg into the skin every 14 (fourteen) days. After the completion of the starter kit  . Ascorbic Acid (VITAMIN C WITH ROSE HIPS) 500 MG tablet Take 500 mg by mouth every morning.  Marland Kitchen atorvastatin (LIPITOR) 80 MG tablet Take 80 mg by mouth every morning.  . butalbital-acetaminophen-caffeine (FIORICET) 50-325-40 MG tablet Take 1 tablet by mouth every 6 (six) hours as needed for migraine.  . Cholecalciferol (VITAMIN D) 2000 units CAPS Take 2,000 Units by mouth every morning.   . citalopram (CELEXA) 20 MG tablet Take 1 tablet (20 mg total) by mouth daily.  . cyanocobalamin (,VITAMIN B-12,) 1000 MCG/ML injection Inject 1,000 mcg into the muscle every 30 (thirty) days.    Marland Kitchen dicyclomine (BENTYL) 20 MG tablet Take 1 tablet (20 mg total) by mouth 3 (three) times daily before meals.  . diphenoxylate-atropine (LOMOTIL) 2.5-0.025 MG tablet 1-2 tablets qid prn diarrhea Instead of Imodium  . epoetin alfa-epbx (RETACRIT) 3000 UNIT/ML injection Inject  3,000 Units into the skin every 14 (fourteen) days.  Marland Kitchen esomeprazole (NEXIUM) 40 MG capsule Take 40 mg by mouth daily before breakfast.    . fenofibrate (TRICOR) 145 MG tablet Take 1 tablet (145 mg total) by  mouth daily.  . furosemide (LASIX) 40 MG tablet Take 1 tablet (40 mg total) by mouth daily. (Patient taking differently: Take 40 mg by mouth 2 (two) times daily.)  . gabapentin (NEURONTIN) 300 MG capsule Take 300 mg by mouth 3 (three) times daily.  . hydrocortisone 2.5 % cream Apply topically 2 (two) times daily.  . Insulin Lispro (HUMALOG KWIKPEN [place]) Inject 10-16 Units into the skin 3 (three) times daily with meals. Sliding scale  . labetalol (NORMODYNE) 300 MG tablet Take 1 tablet (300 mg total) by mouth 2 (two) times daily.  . lipase/protease/amylase (CREON) 36000 UNITS CPEP capsule Take 1 capsule (36,000 Units total) by mouth with snacks.  . meclizine (ANTIVERT) 25 MG tablet Take 1 tablet by mouth as needed.  . nystatin (MYCOSTATIN) 100000 UNIT/ML suspension Take 20 mLs by mouth as needed.  Marland Kitchen omega-3 acid ethyl esters (LOVAZA) 1 g capsule Take 2 capsules by mouth 2 (two) times daily.  . ondansetron (ZOFRAN) 4 MG tablet Take 4 mg by mouth every 8 (eight) hours as needed for nausea.  . potassium chloride SA (K-DUR,KLOR-CON) 20 MEQ tablet Take 20 mEq by mouth every morning.  . promethazine (PHENERGAN) 25 MG tablet Take 1 tablet (25 mg total) by mouth every 8 (eight) hours as needed for refractory nausea / vomiting.  Marland Kitchen tiZANidine (ZANAFLEX) 4 MG tablet Take 8 mg by mouth at bedtime.  Marland Kitchen venlafaxine XR (EFFEXOR-XR) 75 MG 24 hr capsule Take 75 mg by mouth daily.  Marland Kitchen zolpidem (AMBIEN) 10 MG tablet Take 10 mg by mouth at bedtime.  . [DISCONTINUED] CREON 36000-114000 units CPEP capsule Take 2 capsules (72,000 Units total) by mouth 3 (three) times daily.  . [DISCONTINUED] insulin detemir (LEVEMIR FLEXTOUCH) 100 UNIT/ML FlexPen Inject 20 Units into the skin at bedtime. Patient adjust for her sugars  (Patient taking differently: Inject 18-40 Units into the skin at bedtime. Patient reports she takes 18 units QHS but if glucose is "high" she takes 40 units QHS)  . [DISCONTINUED] Semaglutide,0.25 or 0.5MG/DOS, (OZEMPIC, 0.25 OR 0.5 MG/DOSE,) 2 MG/1.5ML SOPN Inject 2 mg into the skin every Sunday.   . insulin detemir (LEVEMIR FLEXTOUCH) 100 UNIT/ML FlexPen Inject 40 Units into the skin at bedtime. Patient adjust for her sugars  . [DISCONTINUED] calcium carbonate (OS-CAL) 1250 (500 Ca) MG chewable tablet Chew 2 tablets by mouth daily. (Patient not taking: No sig reported)   No facility-administered encounter medications on file as of 02/20/2021.    ALLERGIES: Allergies  Allergen Reactions  . Duloxetine Hcl Swelling  . Fluocinolone Swelling  . Acetaminophen Itching and Swelling    Pt states "some swelling"  . Amlodipine Besylate Swelling    Swelling of feet, fluid retention  . Ciprofloxacin Nausea And Vomiting and Swelling    Swelling of face, jaw, and lips  . Hydrocodone-Acetaminophen Itching  . Metformin Other (See Comments)    REACTION: GI UPSET  . Nsaids Other (See Comments)    "HAS BLEEDING ULCERS"  . Quinolones Swelling  . Sulfamethoxazole Swelling    Swelling of feet, legs  . Valsartan Swelling  . Cozaar [Losartan] Swelling and Rash  . Lisinopril Swelling and Rash    Oral swelling, and red streaks on arms/stomach  . Tape Itching and Rash    Paper tape can only be used on this patient    VACCINATION STATUS: Immunization History  Administered Date(s) Administered  . Influenza,inj,Quad PF,6+ Mos 12/26/2016  . Pneumococcal Polysaccharide-23 12/26/2016    Diabetes She presents for her initial  diabetic visit. She has type 2 diabetes mellitus. Onset time: diagnosed at approx age of 33. Her disease course has been fluctuating. Associated symptoms include blurred vision, fatigue, polydipsia, polyuria and weight loss. Symptoms are stable. Diabetic complications include heart  disease, nephropathy and peripheral neuropathy. (Has been hospitalized multiple times for high and low glucose) Risk factors for coronary artery disease include diabetes mellitus, dyslipidemia, family history, hypertension, post-menopausal and sedentary lifestyle. Current diabetic treatment includes intensive insulin program. She is compliant with treatment some of the time. Her weight is stable. She is following a generally unhealthy diet. When asked about meal planning, she reported none. She has had a previous visit with a dietitian (has seen one in the past). She rarely participates in exercise. Her home blood glucose trend is fluctuating dramatically. Her breakfast blood glucose range is generally >200 mg/dl. Her lunch blood glucose range is generally >200 mg/dl. Her dinner blood glucose range is generally >200 mg/dl. Her overall blood glucose range is >200 mg/dl. (She presents today for her consultation, with her logs (last 3 days worth of info) to review.  Her most recent A1c was 11% on 4/8.  She has recently undergone treatment with oral steroids for acute bronchitis, still has cough (going to be rechecked with her PCP today for possible pneumonia).  She routinely monitors glucose 3 times daily.  She reports she drinks mostly water (with crystal light packets mixed in) and does not truly eat any meals due to gastroparesis and inability to keep food down (therefore she snacks frequently throughout the day).  She does not engage in routine physical activity since her back surgery and recent fall.  She denies any significant hypoglycemia but reports that with her current dose of Humalog, she typically drops fast after taking it.) An ACE inhibitor/angiotensin II receptor blocker is contraindicated (reported allergy to ACE and ARBs). She does not see a podiatrist.Eye exam is current.  Hypertension This is a chronic problem. The current episode started more than 1 year ago. The problem has been waxing and waning  since onset. The problem is uncontrolled. Associated symptoms include blurred vision. There are no associated agents to hypertension. Risk factors for coronary artery disease include diabetes mellitus, dyslipidemia, family history, post-menopausal state and sedentary lifestyle. Past treatments include diuretics and beta blockers. The current treatment provides mild improvement. Compliance problems include diet and exercise.  Hypertensive end-organ damage includes kidney disease and CAD/MI. Identifiable causes of hypertension include chronic renal disease.  Hyperlipidemia This is a chronic problem. The problem is uncontrolled. Recent lipid tests were reviewed and are high. Exacerbating diseases include chronic renal disease and diabetes. Factors aggravating her hyperlipidemia include beta blockers and fatty foods. Current antihyperlipidemic treatment includes statins and fibric acid derivatives. The current treatment provides mild improvement of lipids. Compliance problems include adherence to diet and adherence to exercise.  Risk factors for coronary artery disease include diabetes mellitus, dyslipidemia, family history, hypertension, post-menopausal and a sedentary lifestyle.     Review of systems  Constitutional: + Minimally fluctuating body weight,  current Body mass index is 24.14 kg/m. , + fatigue, no subjective hyperthermia, no subjective hypothermia Eyes: + blurry vision, no xerophthalmia ENT: no sore throat, no nodules palpated in throat, no dysphagia/odynophagia, no hoarseness Cardiovascular: no chest pain, no shortness of breath, no palpitations, no leg swelling Respiratory: + acute cough (undergoing treatment for bronchitis), no shortness of breath Gastrointestinal: no nausea/vomiting/diarrhea Musculoskeletal: no muscle/joint aches, recently had a fall (contributes recent back surgery to her fall) Skin:  no rashes, no hyperemia Neurological: no tremors, no numbness, no tingling, no  dizziness Psychiatric: no depression, no anxiety  Objective:     BP (!) 143/78 (BP Location: Left Arm, Patient Position: Sitting)   Pulse 85   Ht _0  (1.575 m)   Wt 132 lb (59.9 kg)   LMP  (LMP Unknown)   BMI 24.14 kg/m   Wt Readings from Last 3 Encounters:  02/20/21 132 lb (59.9 kg)  02/16/21 132 lb 4.4 oz (60 kg)  01/06/21 132 lb 4.4 oz (60 kg)     BP Readings from Last 3 Encounters:  02/20/21 (!) 143/78  02/16/21 (!) 205/87  02/02/21 (!) 180/75     Physical Exam- Limited  Constitutional:  Body mass index is 24.14 kg/m. , not in acute distress, normal state of mind Eyes:  EOMI, no exophthalmos Neck: Supple Cardiovascular: RRR, no murmers, rubs, or gallops, no edema Respiratory: Adequate breathing efforts, fine crackles LLL, no rales, rhonchi, + wheezing in bilateral bases (L>R) Musculoskeletal: no gross deformities, strength intact in all four extremities, no gross restriction of joint movements Skin:  no rashes, no hyperemia Neurological: no tremor with outstretched hands    CMP ( most recent) CMP     Component Value Date/Time   NA 131 (L) 02/02/2021 1529   NA 143 06/17/2017 1220   K 4.4 02/02/2021 1529   CL 97 (L) 02/02/2021 1529   CO2 22 02/02/2021 1529   GLUCOSE 463 (H) 02/02/2021 1529   BUN 26 (H) 02/02/2021 1529   BUN 34 (H) 06/17/2017 1220   CREATININE 1.72 (H) 02/02/2021 1529   CALCIUM 9.3 02/02/2021 1529   PROT 6.1 (L) 01/11/2021 0523   PROT 7.0 06/17/2017 1220   ALBUMIN 3.8 02/02/2021 1529   ALBUMIN 4.4 06/17/2017 1220   AST 23 01/11/2021 0523   ALT 16 01/11/2021 0523   ALKPHOS 60 01/11/2021 0523   BILITOT 0.2 (L) 01/11/2021 0523   BILITOT 0.3 06/17/2017 1220   GFRNONAA 32 (L) 02/02/2021 1529   GFRAA 56 (L) 05/19/2020 0640     Diabetic Labs (most recent): Lab Results  Component Value Date   HGBA1C 11.0 (H) 01/06/2021   HGBA1C 6.9 (H) 05/17/2020   HGBA1C 6.0 08/06/2018     Lipid Panel ( most recent) Lipid Panel     Component  Value Date/Time   CHOL 405 (H) 08/22/2020 1426   TRIG 597 (H) 01/08/2021 1100   HDL 30 (L) 08/22/2020 1426   CHOLHDL 10.7 05/17/2020 0445   VLDL UNABLE TO CALCULATE IF TRIGLYCERIDE OVER 400 mg/dL 05/17/2020 0445   LDLCALC Comment (A) 08/22/2020 1426   LDLDIRECT 129 (H) 08/22/2020 1426   LDLDIRECT 96.9 05/17/2020 0445   LABVLDL Comment (A) 08/22/2020 1426      Lab Results  Component Value Date   TSH 2.19 03/10/2020   TSH 0.78 08/06/2018   TSH 1.489 12/25/2016           Assessment & Plan:   1) Uncontrolled Type 2 Diabetes with nephropathy  She presents today for her consultation, with her logs (last 3 days worth of info) to review.  Her most recent A1c was 11% on 4/8.  She has recently undergone treatment with oral steroids for acute bronchitis, still has cough (going to be rechecked with her PCP today for possible pneumonia).  She routinely monitors glucose 3 times daily.  She reports she drinks mostly water (with crystal light packets mixed in) and does not truly eat any meals due to  gastroparesis and inability to keep food down (therefore she snacks frequently throughout the day).  She does not engage in routine physical activity since her back surgery and recent fall.  She denies any significant hypoglycemia but reports that with her current dose of Humalog, she typically drops fast after taking it.  - Heather [name] has currently uncontrolled symptomatic type 2 DM since 70 years of age, with most recent A1c of 11 %.   -Recent labs reviewed.  - I had a long discussion with her about the progressive nature of diabetes and the pathology behind its complications. -her diabetes is complicated by CKD, CAD, neuropathy, and pancreatitis and she remains at a high risk for more acute and chronic complications which include CAD, CVA, CKD, retinopathy, and neuropathy. These are all discussed in detail with her.  - I have counseled her on diet  and weight management by adopting a  carbohydrate restricted/protein rich diet. Patient is encouraged to switch to unprocessed or minimally processed complex starch and increased protein intake (animal or plant source), fruits, and vegetables. -  she is advised to stick to a routine mealtimes to eat 3 meals a day and avoid unnecessary snacks (to snack only to correct hypoglycemia).   - she acknowledges that there is a room for improvement in her food and drink choices. - Suggestion is made for her to avoid simple carbohydrates  from her diet including Cakes, Sweet Desserts, Ice Cream, Soda (diet and regular), Sweet Tea, Candies, Chips, Cookies, Store Bought Juices, Alcohol in Excess of  1-2 drinks a day, Artificial Sweeteners, Coffee Creamer, and "Sugar-free" Products. This will help patient to have more stable blood glucose profile and potentially avoid unintended weight gain.  - I have approached her with the following individualized plan to manage  her diabetes and patient agrees:   -She is encouraged to continue Levemir 40 units SQ nightly, adjust her Humalog to 10-16 units TID with meals if glucose above 90 and she is eating.  Specific instructions on how to titrate her insulin dose based on glucose readings given to patient in writing.  She also demonstrated her ability to follow the SSI chart properly with me today.   -she is encouraged to start monitoring glucose 4 times daily, before meals and before bed, to log their readings on the clinic sheets provided, and bring them to review at follow up appointment in 2 weeks.  - she is warned not to take insulin without proper monitoring per orders. - Adjustment parameters are given to her for hypo and hyperglycemia in writing. - she is encouraged to call clinic for blood glucose levels less than 70 or above 300 mg /dl.  - her Ozempic will be discontinued, risk outweighs benefit for this patient (given her chronic pancreatitis)  -She reportedly does not tolerate Metformin due to GI  side effects.  - Specific targets for  A1c;  LDL, HDL,  and Triglycerides were discussed with the patient.  2) Blood Pressure /Hypertension:  her blood pressure is not controlled to target.   she is advised to continue her current medications including Lasix 40 mg po twice daily, and Labetalol 300 mg p.o. twice daily.  Will defer to nephrology for med changes.  3) Lipids/Hyperlipidemia:    Review of her recent lipid panel from 01/08/21 showed uncontrolled triglycerides of 597, LDL unable to be calculated .  she  is advised to continue Lipitor 80 mg po daily at bedtime and Fenofibrate 145 mg daily.  Side  effects and precautions discussed with her.  4)  Weight/Diet:  her Body mass index is 24.14 kg/m.  -   she is not a candidate for weight loss.  Exercise, and detailed carbohydrates information provided  -  detailed on discharge instructions.  5) Chronic Care/Health Maintenance: -she is on Statin medications and is encouraged to initiate and continue to follow up with Ophthalmology, Dentist, Podiatrist at least yearly or according to recommendations, and advised to stay away from smoking. I have recommended yearly flu vaccine and pneumonia vaccine at least every 5 years; moderate intensity exercise for up to 150 minutes weekly; and sleep for at least 7 hours a day.  - she is advised to maintain close follow up with [name], MD for primary care needs, as well as her other providers for optimal and coordinated care.   - Time spent in this patient care: 60 min, of which > 50% was spent in counseling her about her diabetes and the rest reviewing her blood glucose logs, discussing her hypoglycemia and hyperglycemia episodes, reviewing her current and previous labs/studies (including abstraction from other facilities) and medications doses and developing a long term treatment plan based on the latest standards of care/guidelines; and documenting her care.    Please refer to Patient Instructions for  Blood Glucose Monitoring and Insulin/Medications Dosing Guide" in media tab for additional information. Please also refer to "Patient Self Inventory" in the Media tab for reviewed elements of pertinent patient history.  Heather [name] participated in the discussions, expressed understanding, and voiced agreement with the above plans.  All questions were answered to her satisfaction. she is encouraged to contact clinic should she have any questions or concerns prior to her return visit.   Follow up plan: - Return in about 2 weeks (around 03/06/2021) for Diabetes follow up, Bring glucometer and logs, ABI next visit.  Rayetta Pigg, [facility] [address] [place], [place] [address] Phone: [phone] Fax: [phone]  02/20/2021, 9:20 AM

## 2021-02-21 ENCOUNTER — Ambulatory Visit: Payer: Medicare Other | Admitting: Cardiology

## 2021-02-27 ENCOUNTER — Ambulatory Visit (INDEPENDENT_AMBULATORY_CARE_PROVIDER_SITE_OTHER): Payer: 59 | Admitting: Nurse Practitioner

## 2021-02-27 ENCOUNTER — Other Ambulatory Visit: Payer: Self-pay

## 2021-02-27 ENCOUNTER — Encounter: Payer: Self-pay | Admitting: Nurse Practitioner

## 2021-02-27 ENCOUNTER — Emergency Department (HOSPITAL_COMMUNITY)
Admission: EM | Admit: 2021-02-27 | Discharge: 2021-02-27 | Disposition: A | Discharge status: Home / Self Care | Payer: 59 | Attending: Emergency Medicine | Admitting: Emergency Medicine

## 2021-02-27 ENCOUNTER — Emergency Department (HOSPITAL_COMMUNITY): Payer: 59

## 2021-02-27 ENCOUNTER — Encounter (HOSPITAL_COMMUNITY): Payer: Self-pay | Admitting: *Deleted

## 2021-02-27 VITALS — BP 207/93 | HR 82 | Ht 62.0 in | Wt 132.0 lb

## 2021-02-27 DIAGNOSIS — R739 Hyperglycemia, unspecified: Secondary | ICD-10-CM | POA: Diagnosis not present

## 2021-02-27 DIAGNOSIS — R519 Headache, unspecified: Secondary | ICD-10-CM | POA: Diagnosis present

## 2021-02-27 DIAGNOSIS — Z5321 Procedure and treatment not carried out due to patient leaving prior to being seen by health care provider: Secondary | ICD-10-CM | POA: Insufficient documentation

## 2021-02-27 DIAGNOSIS — E1121 Type 2 diabetes mellitus with diabetic nephropathy: Secondary | ICD-10-CM

## 2021-02-27 DIAGNOSIS — E782 Mixed hyperlipidemia: Secondary | ICD-10-CM

## 2021-02-27 DIAGNOSIS — I10 Essential (primary) hypertension: Secondary | ICD-10-CM | POA: Diagnosis not present

## 2021-02-27 LAB — CBG MONITORING, ED: Glucose-Capillary: 463 mg/dL — ABNORMAL HIGH (ref 70–99)

## 2021-02-27 MED ORDER — CLONIDINE HCL 0.2 MG PO TABS: 0.1000 mg | ORAL_TABLET | Freq: Two times a day (BID) | ORAL | 3 refills | Status: DC

## 2021-02-27 MED ORDER — LEVEMIR FLEXTOUCH 100 UNIT/ML ~~LOC~~ SOPN: 60.0000 [IU] | PEN_INJECTOR | Freq: Every day | SUBCUTANEOUS | 3 refills | Status: DC

## 2021-02-27 NOTE — ED Triage Notes (Signed)
States she has been treated for bronchitis for the past 2 weeks, states it is not improving and today has a headache,

## 2021-02-27 NOTE — Patient Instructions (Signed)

## 2021-02-27 NOTE — Progress Notes (Addendum)
Endocrinology Follow Up Note       02/27/2021, 2:16 PM   Subjective:    Patient ID: Heather [name], female    DOB: 1951/04/03.  Heather [name] is being seen in follow up after being seen in consultation for management of currently uncontrolled symptomatic diabetes requested by  [name], MD.   Past Medical History:  Diagnosis Date  . Acute renal failure superimposed on stage 3 chronic kidney disease ([place]) 01/21/2016  . Anemia   . Anxiety   . B12 deficiency   . Brain tumor (benign) ([place]) 10/29/2020  . Cellulitis and abscess    Abdomen and buttocks  . CKD (chronic kidney disease) stage 3, GFR 30-59 ml/min (HCC)   . Closed fracture of distal end of femur, unspecified fracture morphology, initial encounter ([place])   . Depression   . Diabetic neuropathy (HCC)    feet  . DJD (degenerative joint disease)    Right forminal stenosis C4-5  . Essential hypertension   . Femur fracture, right ([place]) 03/30/2017  . Folliculitis   . GERD (gastroesophageal reflux disease)   . Gout   . Headache(784.0)   . Hiatal hernia   . History of blood transfusion   . History of bronchitis   . History of cardiac catheterization    No significant CAD 2012  . History of kidney stones    surgery to remove  . Insomnia   . Irritable bowel syndrome   . Mixed hyperlipidemia   . Osteoarthritis   . Palpitations   . Pneumonia   . Reflux esophagitis   . Salmonella   . Stroke ([place])   . Tinnitus    Right  . Type 2 diabetes mellitus ([place])     Past Surgical History:  Procedure Laterality Date  . BACK SURGERY    . c-spine surgery     03/2007  . CARDIAC CATHETERIZATION    . CHOLECYSTECTOMY    . COLONOSCOPY  04/21/2011; 05/29/11   6/12: [place] - ?AVM at IC valve, inflammatory changes at [facility] valve [facility]); 7/12 - [facility]; IC valve erosions, look chronic and probably Crohn's per path  . colonoscopy  2017    [place]: random biopsies normal. TI normal  . ESOPHAGOGASTRODUODENOSCOPY  05/29/11   normal  . ESOPHAGOGASTRODUODENOSCOPY N/A 01/21/2016   Dr. Michail Sermon: normal  . EYE SURGERY Bilateral    cataracts removed  . FEMUR IM NAIL Right 04/10/2018   Procedure: INTRAMEDULLARY (IM) NAIL FEMORAL;  Surgeon: Paralee Cancel, MD;  Location: [facility];  Service: Orthopedics;  Laterality: Right;  . FRACTURE SURGERY    . GIVENS CAPSULE STUDY  11/17/2020  . HARDWARE REMOVAL Right 04/10/2018   Procedure: HARDWARE REMOVAL RIGHT DISTAL FEMUR;  Surgeon: Paralee Cancel, MD;  Location: [facility];  Service: Orthopedics;  Laterality: Right;  . HERNIA REPAIR    . IR FLUORO GUIDE CV LINE RIGHT  04/01/2017  . IR US GUIDE VASC ACCESS RIGHT  04/01/2017  . JOINT REPLACEMENT Right    hip  . OPEN REDUCTION INTERNAL FIXATION (ORIF) DISTAL RADIAL FRACTURE Right 05/28/2019   Procedure: OPEN REDUCTION INTERNAL FIXATION (ORIF) DISTAL RADIAL FRACTURE;  Surgeon: Verner Mould, MD;  [facility];  Service: Orthopedics;  Laterality: Right;  with block 90 minutes  . ORIF FEMUR FRACTURE Right 03/31/2017   Procedure: OPEN REDUCTION INTERNAL FIXATION (ORIF) DISTAL FEMUR FRACTURE;  Surgeon: Paralee Cancel, MD;  Location: [facility];  Service: Orthopedics;  Laterality: Right;  . removal of kidney stone    . Right knee arthroscopic surgery    . TONSILLECTOMY    . TOTAL ABDOMINAL HYSTERECTOMY      Social History   Socioeconomic History  . Marital status: Married    Spouse name: Not on file  . Number of children: 2  . Years of education: Not on file  . Highest education level: Not on file  Occupational History  . Occupation: Disabled  Tobacco Use  . Smoking status: Never Smoker  . Smokeless tobacco: Never Used  Vaping Use  . Vaping Use: Never used  Substance and Sexual Activity  . Alcohol use: No  . Drug use: No  . Sexual activity: Not on file    Comment: Hysterectomy  Other Topics Concern  . 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    Social Determinants of Health   Financial Resource Strain: Not on file  Food Insecurity: Not on file  Transportation Needs: Not on file  Physical Activity: Not on file  Stress: Not on file  Social Connections: Not on file    Family History  Problem Relation Age of Onset  . Diabetes Mother   . Colon cancer Brother        POSSIBLY. Patient states diagnosed at age 14, then later states age 62. unclear and limited historian  . Esophageal cancer Neg Hx   . Rectal cancer Neg Hx   . Stomach cancer Neg Hx     Outpatient Encounter Medications as of 02/27/2021  Medication Sig  . cloNIDine (CATAPRES) 0.2 MG tablet Take 0.5 tablets (0.1 mg total) by mouth 2 (two) times daily.  Marland Kitchen acetaminophen (TYLENOL) 650 MG CR tablet Take 650 mg by mouth every 8 (eight) hours as needed for pain.  . Adalimumab (HUMIRA PEN) 40 MG/0.4ML PNKT Inject 40 mg into the skin every 14 (fourteen) days. After the completion of the starter kit  . Ascorbic Acid (VITAMIN C WITH ROSE HIPS) 500 MG tablet Take 500 mg by mouth every morning.  Marland Kitchen atorvastatin (LIPITOR) 80 MG tablet Take 80 mg by mouth every morning.  . butalbital-acetaminophen-caffeine (FIORICET) 50-325-40 MG tablet Take 1 tablet by mouth every 6 (six) hours as needed for migraine.  . Cholecalciferol (VITAMIN D) 2000 units CAPS Take 2,000 Units by mouth every morning.   . citalopram (CELEXA) 20 MG tablet Take 1 tablet (20 mg total) by mouth daily.  . cyanocobalamin (,VITAMIN B-12,) 1000 MCG/ML injection Inject 1,000 mcg into the muscle every 30 (thirty) days.    Marland Kitchen dicyclomine (BENTYL) 20 MG tablet Take 1 tablet (20 mg total) by mouth 3 (three) times daily before meals.  .  diphenoxylate-atropine (LOMOTIL) 2.5-0.025 MG tablet 1-2 tablets qid prn diarrhea Instead of Imodium  . epoetin alfa-epbx (RETACRIT) 3000 UNIT/ML injection Inject 3,000 Units into the skin every 14 (fourteen) days.  Marland Kitchen esomeprazole (NEXIUM) 40  MG capsule Take 40 mg by mouth daily before breakfast.    . fenofibrate (TRICOR) 145 MG tablet Take 1 tablet (145 mg total) by mouth daily.  . furosemide (LASIX) 40 MG tablet Take 1 tablet (40 mg total) by mouth daily. (Patient taking differently: Take 40 mg by mouth 2 (two) times daily.)  . gabapentin (NEURONTIN) 300 MG capsule Take 300 mg by mouth 3 (three) times daily.  . hydrocortisone 2.5 % cream Apply topically 2 (two) times daily.  . insulin detemir (LEVEMIR FLEXTOUCH) 100 UNIT/ML FlexPen Inject 60 Units into the skin at bedtime. Patient adjust for her sugars  . Insulin Lispro (HUMALOG KWIKPEN [place]) Inject 14-20 Units into the skin 3 (three) times daily with meals. Sliding scale  . labetalol (NORMODYNE) 300 MG tablet Take 1 tablet (300 mg total) by mouth 2 (two) times daily.  Marland Kitchen levofloxacin (LEVAQUIN) 500 MG tablet Take 500 mg by mouth daily.  . lipase/protease/amylase (CREON) 36000 UNITS CPEP capsule Take 1 capsule (36,000 Units total) by mouth with snacks.  . meclizine (ANTIVERT) 25 MG tablet Take 1 tablet by mouth as needed.  . nystatin (MYCOSTATIN) 100000 UNIT/ML suspension Take 20 mLs by mouth as needed.  Marland Kitchen omega-3 acid ethyl esters (LOVAZA) 1 g capsule Take 2 capsules by mouth 2 (two) times daily.  . ondansetron (ZOFRAN) 4 MG tablet Take 4 mg by mouth every 8 (eight) hours as needed for nausea.  . potassium chloride SA (K-DUR,KLOR-CON) 20 MEQ tablet Take 20 mEq by mouth every morning.  . predniSONE (DELTASONE) 20 MG tablet Take 20 mg by mouth daily.  . predniSONE (STERAPRED UNI-PAK 21 TAB) 5 MG (21) TBPK tablet Take by mouth daily.  . promethazine (PHENERGAN) 25 MG tablet Take 1 tablet (25 mg total) by mouth every 8 (eight) hours as needed for  refractory nausea / vomiting.  Marland Kitchen tiZANidine (ZANAFLEX) 4 MG tablet Take 8 mg by mouth at bedtime.  Marland Kitchen venlafaxine XR (EFFEXOR-XR) 75 MG 24 hr capsule Take 75 mg by mouth daily.  Marland Kitchen zolpidem (AMBIEN) 10 MG tablet Take 10 mg by mouth at bedtime.  . [DISCONTINUED] insulin detemir (LEVEMIR FLEXTOUCH) 100 UNIT/ML FlexPen Inject 40 Units into the skin at bedtime. Patient adjust for her sugars   No facility-administered encounter medications on file as of 02/27/2021.    ALLERGIES: Allergies  Allergen Reactions  . Duloxetine Hcl Swelling  . Fluocinolone Swelling  . Acetaminophen Itching and Swelling    Pt states "some swelling"  . Amlodipine Besylate Swelling    Swelling of feet, fluid retention  . Ciprofloxacin Nausea And Vomiting and Swelling    Swelling of face, jaw, and lips  . Hydrocodone-Acetaminophen Itching  . Metformin Other (See Comments)    REACTION: GI UPSET  . Nsaids Other (See Comments)    "HAS BLEEDING ULCERS"  . Quinolones Swelling  . Sulfamethoxazole Swelling    Swelling of feet, legs  . Valsartan Swelling  . Cozaar [Losartan] Swelling and Rash  . Lisinopril Swelling and Rash    Oral swelling, and red streaks on arms/stomach  . Tape Itching and Rash    Paper tape can only be used on this patient    VACCINATION STATUS: Immunization History  Administered Date(s) Administered  . Influenza,inj,Quad PF,6+ Mos 12/26/2016  . Pneumococcal Polysaccharide-23 12/26/2016    Diabetes She presents for her follow-up diabetic visit. She has type 2 diabetes mellitus. Onset time: diagnosed at approx age of 24. Her disease course has been fluctuating. Hypoglycemia symptoms include headaches. Associated  symptoms include blurred vision, fatigue, polydipsia, polyuria, weakness and weight loss. Symptoms are stable. Diabetic complications include heart disease, nephropathy and peripheral neuropathy. (Has been hospitalized multiple times for high and low glucose) Risk factors for coronary  artery disease include diabetes mellitus, dyslipidemia, family history, hypertension, post-menopausal and sedentary lifestyle. Current diabetic treatment includes intensive insulin program. She is compliant with treatment most of the time. Her weight is stable. She is following a generally unhealthy diet. When asked about meal planning, she reported none. She has had a previous visit with a dietitian (has seen one in the past). She rarely participates in exercise. Her home blood glucose trend is fluctuating minimally. Her breakfast blood glucose range is generally >200 mg/dl. Her lunch blood glucose range is generally >200 mg/dl. Her dinner blood glucose range is generally >200 mg/dl. Her bedtime blood glucose range is generally >200 mg/dl. Her overall blood glucose range is >200 mg/dl. (She presents today with her meter and logs showing significantly high glycemic profile both fasting and postprandially.  She is still struggling with her upper respiratory infection, currently on prednisone which is contributing to her high readings.  She says she has not felt well and fell numerous times last week.  Her blood pressure continues to be at stroke level and has declined recommendations from her other providers to seek care at ED.  There are no episodes of hypoglycemia.) An ACE inhibitor/angiotensin II receptor blocker is contraindicated (reported allergy to ACE and ARBs). She does not see a podiatrist.Eye exam is current.  Hypertension This is a chronic problem. The current episode started more than 1 year ago. The problem has been rapidly worsening since onset. The problem is uncontrolled. Associated symptoms include blurred vision, headaches, malaise/fatigue and shortness of breath. There are no associated agents to hypertension. Risk factors for coronary artery disease include diabetes mellitus, dyslipidemia, family history, post-menopausal state and sedentary lifestyle. Past treatments include diuretics and beta  blockers. The current treatment provides mild improvement. Compliance problems include diet and exercise.  Hypertensive end-organ damage includes kidney disease and CAD/MI. Identifiable causes of hypertension include chronic renal disease.  Hyperlipidemia This is a chronic problem. The problem is uncontrolled. Recent lipid tests were reviewed and are high. Exacerbating diseases include chronic renal disease and diabetes. Factors aggravating her hyperlipidemia include beta blockers and fatty foods. Associated symptoms include shortness of breath. Current antihyperlipidemic treatment includes statins and fibric acid derivatives. The current treatment provides mild improvement of lipids. Compliance problems include adherence to diet and adherence to exercise.  Risk factors for coronary artery disease include diabetes mellitus, dyslipidemia, family history, hypertension, post-menopausal and a sedentary lifestyle.     Review of systems  Constitutional: + Minimally fluctuating body weight,  current Body mass index is 24.14 kg/m. , + fatigue, no subjective hyperthermia, no subjective hypothermia, + Headache Eyes: + blurry vision, no xerophthalmia ENT: no sore throat, no nodules palpated in throat, no dysphagia/odynophagia, no hoarseness Cardiovascular: no chest pain,+ shortness of breath, no palpitations, no leg swelling Respiratory: + acute cough (undergoing treatment for bronchitis), no shortness of breath Gastrointestinal: no nausea/vomiting/diarrhea Musculoskeletal: no muscle/joint aches, recently had a fall (contributes recent back surgery to her fall) Skin: no rashes, no hyperemia Neurological: no tremors, no numbness, no tingling, no dizziness Psychiatric: no depression, no anxiety  Objective:     BP (!) 207/93 (BP Location: Right Arm)   Pulse 82   Ht 5' 2" (1.575 m)   Wt 132 lb (59.9 kg)   LMP  (LMP  Unknown)   BMI 24.14 kg/m   Wt Readings from Last 3 Encounters:  02/27/21 132 lb (59.9  kg)  02/20/21 132 lb (59.9 kg)  02/16/21 132 lb 4.4 oz (60 kg)     BP Readings from Last 3 Encounters:  02/27/21 (!) 207/93  02/20/21 (!) 143/78  02/16/21 (!) 205/87     Physical Exam- Limited  Constitutional:  Body mass index is 24.14 kg/m. , not in acute distress, normal state of mind Eyes:  EOMI, no exophthalmos Neck: Supple Cardiovascular: RRR, no murmers, rubs, or gallops, no edema Respiratory: Adequate breathing efforts, fine crackles LLL, no rales, rhonchi, + wheezing in bilateral bases (L>R) Musculoskeletal: no gross deformities, strength intact in all four extremities, no gross restriction of joint movements Skin:  no rashes, no hyperemia Neurological: no tremor with outstretched hands   POCT ABI Results 02/27/21   Right ABI:  1.05      Left ABI:  1.08  Right leg systolic / diastolic: 161/096 mmHg Left leg systolic / diastolic: 045/409 mmHg  Arm systolic / diastolic: 811/91 mmHG  Detailed report will be scanned into patient chart.  CMP ( most recent) CMP     Component Value Date/Time   NA 131 (L) 02/02/2021 1529   NA 143 06/17/2017 1220   K 4.4 02/02/2021 1529   CL 97 (L) 02/02/2021 1529   CO2 22 02/02/2021 1529   GLUCOSE 463 (H) 02/02/2021 1529   BUN 26 (H) 02/02/2021 1529   BUN 34 (H) 06/17/2017 1220   CREATININE 1.72 (H) 02/02/2021 1529   CALCIUM 9.3 02/02/2021 1529   PROT 6.1 (L) 01/11/2021 0523   PROT 7.0 06/17/2017 1220   ALBUMIN 3.8 02/02/2021 1529   ALBUMIN 4.4 06/17/2017 1220   AST 23 01/11/2021 0523   ALT 16 01/11/2021 0523   ALKPHOS 60 01/11/2021 0523   BILITOT 0.2 (L) 01/11/2021 0523   BILITOT 0.3 06/17/2017 1220   GFRNONAA 32 (L) 02/02/2021 1529   GFRAA 56 (L) 05/19/2020 0640     Diabetic Labs (most recent): Lab Results  Component Value Date   HGBA1C 11.0 (H) 01/06/2021   HGBA1C 6.9 (H) 05/17/2020   HGBA1C 6.0 08/06/2018     Lipid Panel ( most recent) Lipid Panel     Component Value Date/Time   CHOL 405 (H) 08/22/2020  1426   TRIG 597 (H) 01/08/2021 1100   HDL 30 (L) 08/22/2020 1426   CHOLHDL 10.7 05/17/2020 0445   VLDL UNABLE TO CALCULATE IF TRIGLYCERIDE OVER 400 mg/dL 05/17/2020 0445   LDLCALC Comment (A) 08/22/2020 1426   LDLDIRECT 129 (H) 08/22/2020 1426   LDLDIRECT 96.9 05/17/2020 0445   LABVLDL Comment (A) 08/22/2020 1426      Lab Results  Component Value Date   TSH 2.19 03/10/2020   TSH 0.78 08/06/2018   TSH 1.489 12/25/2016           Assessment & Plan:   1) Uncontrolled Type 2 Diabetes with nephropathy  She presents today with her meter and logs showing significantly high glycemic profile both fasting and postprandially.  She is still struggling with her upper respiratory infection, currently on prednisone which is contributing to her high readings.  She says she has not felt well and fell numerous times last week.  Her blood pressure continues to be at stroke level and has declined recommendations from her other providers to seek care at ED.  There are no episodes of hypoglycemia.  - Heather [name] has currently uncontrolled symptomatic type  2 DM since 70 years of age, with most recent A1c of 11 %.   -Recent labs reviewed.  - I had a long discussion with her about the progressive nature of diabetes and the pathology behind its complications. -her diabetes is complicated by CKD, CAD, neuropathy, and pancreatitis and she remains at a high risk for more acute and chronic complications which include CAD, CVA, CKD, retinopathy, and neuropathy. These are all discussed in detail with her.  - Nutritional counseling repeated at each appointment due to patients tendency to fall back in to old habits.  - The patient admits there is a room for improvement in their diet and drink choices. -  Suggestion is made for the patient to avoid simple carbohydrates from their diet including Cakes, Sweet Desserts / Pastries, Ice Cream, Soda (diet and regular), Sweet Tea, Candies, Chips, Cookies, Sweet  Pastries,  Store Bought Juices, Alcohol in Excess of  1-2 drinks a day, Artificial Sweeteners, Coffee Creamer, and "Sugar-free" Products. This will help patient to have stable blood glucose profile and potentially avoid unintended weight gain.   - I encouraged the patient to switch to  unprocessed or minimally processed complex starch and increased protein intake (animal or plant source), fruits, and vegetables.   - Patient is advised to stick to a routine mealtimes to eat 3 meals  a day and avoid unnecessary snacks ( to snack only to correct hypoglycemia).  - I have approached her with the following individualized plan to manage  her diabetes and patient agrees:   -She is advised to increase her Levemir to 60 units SQ nightly and Humalog to 14-20 units TID with meals if glucose is above 90 and she is eating.  Specific instructions on how to titrate insulin dose based on glucose readings given to patient in writing.    -she is encouraged to continue monitoring glucose 4 times daily, before meals and before bed, and to call the clinic if she has readings less than 70 or greater than 300 for 3 tests in a row.  - she is warned not to take insulin without proper monitoring per orders. - Adjustment parameters are given to her for hypo and hyperglycemia in writing.  - her Ozempic was discontinued last visit, risk outweighs benefit for this patient (given her chronic pancreatitis)  -She reportedly does not tolerate Metformin due to GI side effects.  - Specific targets for  A1c;  LDL, HDL,  and Triglycerides were discussed with the patient.  2) Blood Pressure /Hypertension:  her blood pressure is not controlled to target.  Her blood pressure is extremely elevated.   she is advised to continue her current medications including Lasix 40 mg po twice daily, and Labetalol 300 mg p.o. twice daily.  I discussed and initiated Clonidine 0.2 mg po BID.  I also recommended she go straight to the ED for further  evaluation.  3) Lipids/Hyperlipidemia:    Review of her recent lipid panel from 01/08/21 showed uncontrolled triglycerides of 597, LDL unable to be calculated .  she  is advised to continue Lipitor 80 mg po daily at bedtime and Fenofibrate 145 mg daily.  Side effects and precautions discussed with her.  4)  Weight/Diet:  her Body mass index is 24.14 kg/m.  -   she is not a candidate for weight loss.  Exercise, and detailed carbohydrates information provided  -  detailed on discharge instructions.  5) Chronic Care/Health Maintenance: -she is on Statin medications and is encouraged to  initiate and continue to follow up with Ophthalmology, Dentist, Podiatrist at least yearly or according to recommendations, and advised to stay away from smoking. I have recommended yearly flu vaccine and pneumonia vaccine at least every 5 years; moderate intensity exercise for up to 150 minutes weekly; and sleep for at least 7 hours a day.  - she is advised to maintain close follow up with [name], MD for primary care needs, as well as her other providers for optimal and coordinated care.     I spent 31 minutes in the care of the patient today including review of labs from [place], Lipids, Thyroid Function, Hematology (current and previous including abstractions from other facilities); face-to-face time discussing  her blood glucose readings/logs, discussing hypoglycemia and hyperglycemia episodes and symptoms, medications doses, her options of short and long term treatment based on the latest standards of care / guidelines;  discussion about incorporating lifestyle medicine;  and documenting the encounter.    Please refer to Patient Instructions for Blood Glucose Monitoring and Insulin/Medications Dosing Guide"  in media tab for additional information. Please  also refer to " Patient Self Inventory" in the Media  tab for reviewed elements of pertinent patient history.  Heather [name] participated in the  discussions, expressed understanding, and voiced agreement with the above plans.  All questions were answered to her satisfaction. she is encouraged to contact clinic should she have any questions or concerns prior to her return visit.   Follow up plan: - Return in about 2 weeks (around 03/13/2021) for Diabetes F/U, Bring meter and logs.  Rayetta Pigg, [facility] [address] [place], [place] [address] Phone: [phone] Fax: [phone]  02/27/2021, 2:16 PM

## 2021-03-02 ENCOUNTER — Other Ambulatory Visit: Payer: Self-pay

## 2021-03-02 ENCOUNTER — Encounter (HOSPITAL_COMMUNITY)
Admission: RE | Admit: 2021-03-02 | Discharge: 2021-03-02 | Disposition: A | Discharge status: Home / Self Care | Payer: 59 | Source: Ambulatory Visit | Attending: Nephrology | Admitting: Nephrology

## 2021-03-02 DIAGNOSIS — D631 Anemia in chronic kidney disease: Secondary | ICD-10-CM | POA: Diagnosis not present

## 2021-03-02 DIAGNOSIS — N184 Chronic kidney disease, stage 4 (severe): Secondary | ICD-10-CM | POA: Insufficient documentation

## 2021-03-02 LAB — CBC WITH DIFFERENTIAL/PLATELET
Abs Immature Granulocytes: 0.26 10*3/uL — ABNORMAL HIGH (ref 0.00–0.07)
Basophils Absolute: 0.1 10*3/uL (ref 0.0–0.1)
Basophils Relative: 1 %
Eosinophils Absolute: 0.2 10*3/uL (ref 0.0–0.5)
Eosinophils Relative: 2 %
HCT: 28.1 % — ABNORMAL LOW (ref 36.0–46.0)
Hemoglobin: 9.1 g/dL — ABNORMAL LOW (ref 12.0–15.0)
Immature Granulocytes: 3 %
Lymphocytes Relative: 29 %
Lymphs Abs: 2.8 10*3/uL (ref 0.7–4.0)
MCH: 31.1 pg (ref 26.0–34.0)
MCHC: 32.4 g/dL (ref 30.0–36.0)
MCV: 95.9 fL (ref 80.0–100.0)
Monocytes Absolute: 0.7 10*3/uL (ref 0.1–1.0)
Monocytes Relative: 8 %
Neutro Abs: 5.5 10*3/uL (ref 1.7–7.7)
Neutrophils Relative %: 57 %
Platelets: 264 10*3/uL (ref 150–400)
RBC: 2.93 MIL/uL — ABNORMAL LOW (ref 3.87–5.11)
RDW: 15 % (ref 11.5–15.5)
WBC: 9.5 10*3/uL (ref 4.0–10.5)
nRBC: 0 % (ref 0.0–0.2)

## 2021-03-02 LAB — IRON AND TIBC
Iron: 54 ug/dL (ref 28–170)
Saturation Ratios: 17 % (ref 10.4–31.8)
TIBC: 316 ug/dL (ref 250–450)
UIBC: 262 ug/dL

## 2021-03-02 LAB — RENAL FUNCTION PANEL
Albumin: 3.5 g/dL (ref 3.5–5.0)
Anion gap: 12 (ref 5–15)
BUN: 43 mg/dL — ABNORMAL HIGH (ref 8–23)
CO2: 21 mmol/L — ABNORMAL LOW (ref 22–32)
Calcium: 9.7 mg/dL (ref 8.9–10.3)
Chloride: 102 mmol/L (ref 98–111)
Creatinine, Ser: 1.7 mg/dL — ABNORMAL HIGH (ref 0.44–1.00)
GFR, Estimated: 32 mL/min — ABNORMAL LOW (ref >60)
Glucose, Bld: 68 mg/dL — ABNORMAL LOW (ref 70–99)
Phosphorus: 4.3 mg/dL (ref 2.5–4.6)
Potassium: 4 mmol/L (ref 3.5–5.1)
Sodium: 135 mmol/L (ref 135–145)

## 2021-03-02 LAB — PROTEIN / CREATININE RATIO, URINE
Creatinine, Urine: 83.26 mg/dL
Protein Creatinine Ratio: 1.54 mg/mg{Cre} — ABNORMAL HIGH (ref 0.00–0.15)
Total Protein, Urine: 128 mg/dL

## 2021-03-02 LAB — POCT HEMOGLOBIN-HEMACUE: Hemoglobin: 9.5 g/dL — ABNORMAL LOW (ref 12.0–15.0)

## 2021-03-02 MED ORDER — EPOETIN ALFA-EPBX 3000 UNIT/ML IJ SOLN
INTRAMUSCULAR | Status: AC
  Administered 2021-03-02: 3000 [IU]
  Filled 2021-03-02: qty 1

## 2021-03-02 MED ORDER — EPOETIN ALFA-EPBX 3000 UNIT/ML IJ SOLN: 3000.0000 [IU] | Freq: Once | INTRAMUSCULAR | Status: AC

## 2021-03-03 LAB — PTH, INTACT AND CALCIUM
Calcium, Total (PTH): 9.8 mg/dL (ref 8.7–10.3)
PTH: 99 pg/mL — ABNORMAL HIGH (ref 15–65)

## 2021-03-10 ENCOUNTER — Ambulatory Visit: Payer: 59 | Admitting: Internal Medicine

## 2021-03-14 ENCOUNTER — Ambulatory Visit: Payer: 59 | Admitting: Nurse Practitioner

## 2021-03-14 ENCOUNTER — Ambulatory Visit: Payer: Medicaid Other | Admitting: Internal Medicine

## 2021-03-14 NOTE — Patient Instructions (Incomplete)

## 2021-03-16 ENCOUNTER — Encounter (HOSPITAL_COMMUNITY)
Admission: RE | Admit: 2021-03-16 | Discharge: 2021-03-16 | Disposition: A | Discharge status: Home / Self Care | Payer: 59 | Source: Ambulatory Visit | Attending: Nephrology | Admitting: Nephrology

## 2021-03-16 ENCOUNTER — Encounter (HOSPITAL_COMMUNITY): Admission: RE | Admit: 2021-03-16 | Payer: 59 | Source: Ambulatory Visit

## 2021-03-16 ENCOUNTER — Other Ambulatory Visit: Payer: Self-pay

## 2021-03-16 DIAGNOSIS — N184 Chronic kidney disease, stage 4 (severe): Secondary | ICD-10-CM | POA: Diagnosis not present

## 2021-03-16 LAB — POCT HEMOGLOBIN-HEMACUE: Hemoglobin: 8.9 g/dL — ABNORMAL LOW (ref 12.0–15.0)

## 2021-03-16 MED ORDER — CLONIDINE HCL 0.1 MG PO TABS
0.1000 mg | ORAL_TABLET | Freq: Once | ORAL | Status: AC
  Administered 2021-03-16: 0.1 mg via ORAL
  Filled 2021-03-16: qty 1

## 2021-03-16 MED ORDER — EPOETIN ALFA-EPBX 3000 UNIT/ML IJ SOLN: 3000.0000 [IU] | Freq: Once | INTRAMUSCULAR | Status: DC

## 2021-03-16 NOTE — Progress Notes (Signed)
Dr Toya Smothers office notified of pt's continued elevated BP after morning medications and an additional dose of Clonidine per MD  BP parameters on Retacrit order sheet. Pt asked to come to office to be evaluated further. Injection held @ this time per MD  Order. BP readings are as follows: 197/86, 223/101, 204/87, 202/85

## 2021-03-21 ENCOUNTER — Ambulatory Visit: Payer: 59 | Admitting: Nurse Practitioner

## 2021-03-21 ENCOUNTER — Telehealth: Payer: Self-pay | Admitting: Nurse Practitioner

## 2021-03-21 NOTE — Telephone Encounter (Signed)
Pt wants to discuss getting a dexcom or libre, states her fingers are sore from testing.Marland Kitchen

## 2021-03-21 NOTE — Telephone Encounter (Signed)
Discussed with pt to keep her appointment on 5/31, to bring her BG readings and meter with her so we will have documentation of her BG testing frequency, we will address ordering CGM device at time of appointment. Understanding voiced per pt.

## 2021-03-27 ENCOUNTER — Emergency Department (HOSPITAL_COMMUNITY): Payer: 59

## 2021-03-27 ENCOUNTER — Encounter (HOSPITAL_COMMUNITY): Payer: Self-pay | Admitting: *Deleted

## 2021-03-27 ENCOUNTER — Emergency Department (HOSPITAL_COMMUNITY)
Admission: EM | Admit: 2021-03-27 | Discharge: 2021-03-27 | Discharge status: Against Medical Advice | Payer: 59 | Attending: Emergency Medicine | Admitting: Emergency Medicine

## 2021-03-27 ENCOUNTER — Other Ambulatory Visit: Payer: Self-pay

## 2021-03-27 DIAGNOSIS — R059 Cough, unspecified: Secondary | ICD-10-CM | POA: Diagnosis present

## 2021-03-27 DIAGNOSIS — Z794 Long term (current) use of insulin: Secondary | ICD-10-CM | POA: Diagnosis not present

## 2021-03-27 DIAGNOSIS — Z96641 Presence of right artificial hip joint: Secondary | ICD-10-CM | POA: Diagnosis not present

## 2021-03-27 DIAGNOSIS — B349 Viral infection, unspecified: Secondary | ICD-10-CM | POA: Diagnosis not present

## 2021-03-27 DIAGNOSIS — E1140 Type 2 diabetes mellitus with diabetic neuropathy, unspecified: Secondary | ICD-10-CM | POA: Insufficient documentation

## 2021-03-27 DIAGNOSIS — E1121 Type 2 diabetes mellitus with diabetic nephropathy: Secondary | ICD-10-CM | POA: Insufficient documentation

## 2021-03-27 DIAGNOSIS — Z79899 Other long term (current) drug therapy: Secondary | ICD-10-CM | POA: Diagnosis not present

## 2021-03-27 DIAGNOSIS — I129 Hypertensive chronic kidney disease with stage 1 through stage 4 chronic kidney disease, or unspecified chronic kidney disease: Secondary | ICD-10-CM | POA: Insufficient documentation

## 2021-03-27 DIAGNOSIS — N1832 Chronic kidney disease, stage 3b: Secondary | ICD-10-CM | POA: Diagnosis not present

## 2021-03-27 LAB — CBC WITH DIFFERENTIAL/PLATELET
Abs Immature Granulocytes: 0.16 10*3/uL — ABNORMAL HIGH (ref 0.00–0.07)
Basophils Absolute: 0.1 10*3/uL (ref 0.0–0.1)
Basophils Relative: 1 %
Eosinophils Absolute: 0.2 10*3/uL (ref 0.0–0.5)
Eosinophils Relative: 2 %
HCT: 30.9 % — ABNORMAL LOW (ref 36.0–46.0)
Hemoglobin: 9.5 g/dL — ABNORMAL LOW (ref 12.0–15.0)
Immature Granulocytes: 2 %
Lymphocytes Relative: 15 %
Lymphs Abs: 1.3 10*3/uL (ref 0.7–4.0)
MCH: 30.4 pg (ref 26.0–34.0)
MCHC: 30.7 g/dL (ref 30.0–36.0)
MCV: 99 fL (ref 80.0–100.0)
Monocytes Absolute: 0.7 10*3/uL (ref 0.1–1.0)
Monocytes Relative: 8 %
Neutro Abs: 6.6 10*3/uL (ref 1.7–7.7)
Neutrophils Relative %: 72 %
Platelets: 132 10*3/uL — ABNORMAL LOW (ref 150–400)
RBC: 3.12 MIL/uL — ABNORMAL LOW (ref 3.87–5.11)
RDW: 14.6 % (ref 11.5–15.5)
WBC: 9 10*3/uL (ref 4.0–10.5)
nRBC: 0 % (ref 0.0–0.2)

## 2021-03-27 LAB — BASIC METABOLIC PANEL
Anion gap: 12 (ref 5–15)
BUN: 45 mg/dL — ABNORMAL HIGH (ref 8–23)
CO2: 19 mmol/L — ABNORMAL LOW (ref 22–32)
Calcium: 8.8 mg/dL — ABNORMAL LOW (ref 8.9–10.3)
Chloride: 98 mmol/L (ref 98–111)
Creatinine, Ser: 2.29 mg/dL — ABNORMAL HIGH (ref 0.44–1.00)
GFR, Estimated: 23 mL/min — ABNORMAL LOW (ref >60)
Glucose, Bld: 305 mg/dL — ABNORMAL HIGH (ref 70–99)
Potassium: 4.3 mmol/L (ref 3.5–5.1)
Sodium: 129 mmol/L — ABNORMAL LOW (ref 135–145)

## 2021-03-27 NOTE — ED Provider Notes (Signed)
[facility] Provider Note   CSN: [id number] Arrival date & time: 03/27/21  1046     History Chief Complaint  Patient presents with  . Fever    Heather [name] is a 70 y.o. female.  HPI   Patient with significant medical history of stage III kidney disease secondary due to diabetes, hypertension, stroke presents to the emergency department with chief complaint of URI-like symptoms.  Patient states last Sunday she took a home COVID test and states it was positive, she continues to have a hoarse voice, a dry cough, loss of appetite, diarrhea and general body aches.  She states she took a home COVID test again today and it was negative, she is up-to-date on her COVID and her influenza vaccines, she denies recent sick contacts, states she is tolerating p.o. without difficulty.  She denies shortness of breath, chest pain, leg swelling, has no history of PEs or DVTs.she does state that her left posterior rib is hurting her, she states she had a fall on Saturday night when the pain started, she denies any urinary symptoms at this time.  She denies any alleviating factors.  Patient denies headaches, fevers, chills, chest pain, shortness of breath, abdominal pain, nausea, vomiting, worsening pedal edema.  Past Medical History:  Diagnosis Date  . Acute renal failure superimposed on stage 3 chronic kidney disease ([place]) 01/21/2016  . Anemia   . Anxiety   . B12 deficiency   . Brain tumor (benign) ([place]) 10/29/2020  . Cellulitis and abscess    Abdomen and buttocks  . CKD (chronic kidney disease) stage 3, GFR 30-59 ml/min (HCC)   . Closed fracture of distal end of femur, unspecified fracture morphology, initial encounter ([place])   . Depression   . Diabetic neuropathy (HCC)    feet  . DJD (degenerative joint disease)    Right forminal stenosis C4-5  . Essential hypertension   . Femur fracture, right ([place]) 03/30/2017  . Folliculitis   . GERD (gastroesophageal reflux disease)    . Gout   . Headache(784.0)   . Hiatal hernia   . History of blood transfusion   . History of bronchitis   . History of cardiac catheterization    No significant CAD 2012  . History of kidney stones    surgery to remove  . Insomnia   . Irritable bowel syndrome   . Mixed hyperlipidemia   . Osteoarthritis   . Palpitations   . Pneumonia   . Reflux esophagitis   . Salmonella   . Stroke ([place])   . Tinnitus    Right  . Type 2 diabetes mellitus [facility])     Patient Active Problem List   Diagnosis Date Noted  . Acute pancreatitis   . Anemia   . E coli enteritis 01/09/2021  . Acute on chronic pancreatitis ([place]) 01/08/2021  . Acute renal failure superimposed on stage 3b chronic kidney disease ([place]) 01/07/2021  . Diarrhea   . Acute hypokalemia 01/06/2021  . Long-term use of immunosuppressant medication-anticipated 11/28/2020  . Headache 05/17/2020  . Hypokalemia 05/17/2020  . Hypomagnesemia 05/17/2020  . Dehydration 05/17/2020  . Hypertensive urgency 05/17/2020  . Diplopia 05/17/2020  . Left hip pain 09/22/2019  . Lumbar radiculopathy 09/22/2019  . Postoperative anemia due to acute blood loss 09/18/2019  . Postoperative urinary retention 09/18/2019  . Closed fracture of distal end of radius 05/26/2019  . Closed right hip fracture ([place]) 04/08/2018  . Elevated lipase   . Crohn's disease of  small intestine with other complication ([place])   . Protein-calorie malnutrition, severe 03/05/2018  . Neck pain 03/12/2017  . Acute kidney injury superimposed on CKD ([place]) 01/25/2016  . Neuropathy 03/02/2015  . Pain in the chest   . Chest pain 12/22/2014  . Essential hypertension 12/22/2014  . Type 2 diabetes with nephropathy ([place]) 12/22/2014  . Hiatal hernia 12/22/2014  . GERD (gastroesophageal reflux disease) 12/22/2014  . Gout 12/22/2014  . Mixed hyperlipidemia 12/22/2014  . Depression 12/22/2014  . Type 2 diabetes mellitus without complications ([place]) 37/62/8315  . Fusion of spine,  cervicothoracic region 11/01/2014  . Pseudophakia, left eye 10/20/2014  . Chronic kidney disease, stage III (moderate) ([place]) 08/25/2014  . Cervical stenosis of spine 08/26/2013  . Vitamin B12 deficiency 07/08/2011  . Personal history of failed moderate sedation 04/16/2011  . Chronic Nausea and Vomiting - ? gastroapresis 04/16/2011  . Early satiety 04/16/2011  . Irritable bowel syndrome 04/16/2011  . Benign paroxysmal positional vertigo 11/20/2010  . TRICUSPID REGURGITATION, MODERATE 11/20/2010  . PULMONARY HYPERTENSION, MILD 11/20/2010  . LEG EDEMA, BILATERAL 11/20/2010  . DYSPNEA 11/20/2010  . NONSPECIFIC ABNORMAL ELECTROCARDIOGRAM 11/20/2010  . CHEST WALL PAIN, HX OF 11/20/2010    Past Surgical History:  Procedure Laterality Date  . BACK SURGERY    . c-spine surgery     03/2007  . CARDIAC CATHETERIZATION    . CHOLECYSTECTOMY    . COLONOSCOPY  04/21/2011; 05/29/11   6/12: [place] - ?AVM at IC valve, inflammatory changes at [facility] valve [facility]); 7/12 - [place]; IC valve erosions, look chronic and probably Crohn's per path  . colonoscopy  2017   [place]: random biopsies normal. TI normal  . ESOPHAGOGASTRODUODENOSCOPY  05/29/11   normal  . ESOPHAGOGASTRODUODENOSCOPY N/A 01/21/2016   Dr. Michail Sermon: normal  . EYE SURGERY Bilateral    cataracts removed  . FEMUR IM NAIL Right 04/10/2018   Procedure: INTRAMEDULLARY (IM) NAIL FEMORAL;  Surgeon: Paralee Cancel, MD;  Location: [facility];  Service: Orthopedics;  Laterality: Right;  . FRACTURE SURGERY    . GIVENS CAPSULE STUDY  11/17/2020  . HARDWARE REMOVAL Right 04/10/2018   Procedure: HARDWARE REMOVAL RIGHT DISTAL FEMUR;  Surgeon: Paralee Cancel, MD;  Location: [facility];  Service: Orthopedics;  Laterality: Right;  . HERNIA REPAIR    . IR FLUORO GUIDE CV LINE RIGHT  04/01/2017  . IR US GUIDE VASC ACCESS RIGHT  04/01/2017  . JOINT REPLACEMENT Right    hip  . OPEN REDUCTION INTERNAL FIXATION (ORIF) DISTAL RADIAL FRACTURE Right 05/28/2019    Procedure: OPEN REDUCTION INTERNAL FIXATION (ORIF) DISTAL RADIAL FRACTURE;  Surgeon: Verner Mould, MD;  Location: [place];  Service: Orthopedics;  Laterality: Right;  with block 90 minutes  . ORIF FEMUR FRACTURE Right 03/31/2017   Procedure: OPEN REDUCTION INTERNAL FIXATION (ORIF) DISTAL FEMUR FRACTURE;  Surgeon: Paralee Cancel, MD;  Location: [place];  Service: Orthopedics;  Laterality: Right;  . removal of kidney stone    . Right knee arthroscopic surgery    . TONSILLECTOMY    . TOTAL ABDOMINAL HYSTERECTOMY       OB History   No obstetric history on file.     Family History  Problem Relation Age of Onset  . Diabetes Mother   . Colon cancer Brother        POSSIBLY. Patient states diagnosed at age 6, then later states age 78. unclear and limited historian  . Esophageal cancer Neg Hx   . Rectal cancer Neg Hx   .  Stomach cancer Neg Hx     Social History   Tobacco Use  . Smoking status: Never Smoker  . Smokeless tobacco: Never Used  Vaping Use  . Vaping Use: Never used  Substance Use Topics  . Alcohol use: No  . Drug use: No    Home Medications Prior to Admission medications   Medication Sig Start Date End Date Taking? Authorizing Provider  acetaminophen (TYLENOL) 650 MG CR tablet Take 650 mg by mouth every 8 (eight) hours as needed for pain.    [provider]  Adalimumab (HUMIRA PEN) 40 MG/0.4ML PNKT Inject 40 mg into the skin every 14 (fourteen) days. After the completion of the starter kit 01/16/21   Barton Dubois, MD  Ascorbic Acid (VITAMIN C WITH ROSE HIPS) 500 MG tablet Take 500 mg by mouth every morning.    [provider]  atorvastatin (LIPITOR) 80 MG tablet Take 80 mg by mouth every morning.    [provider]  butalbital-acetaminophen-caffeine (FIORICET) 50-325-40 MG tablet Take 1 tablet by mouth every 6 (six) hours as needed for migraine. 05/19/20 05/19/21  Barton Dubois, MD  Cholecalciferol (VITAMIN D) 2000 units CAPS Take 2,000  Units by mouth every morning.     [provider]  citalopram (CELEXA) 20 MG tablet Take 1 tablet (20 mg total) by mouth daily. 01/12/21   Barton Dubois, MD  cloNIDine (CATAPRES) 0.2 MG tablet Take 0.5 tablets (0.1 mg total) by mouth 2 (two) times daily. 02/27/21   Brita Romp, NP  cyanocobalamin (,VITAMIN B-12,) 1000 MCG/ML injection Inject 1,000 mcg into the muscle every 30 (thirty) days.      [provider]  dicyclomine (BENTYL) 20 MG tablet Take 1 tablet (20 mg total) by mouth 3 (three) times daily before meals. 01/12/21   Barton Dubois, MD  diphenoxylate-atropine (LOMOTIL) 2.5-0.025 MG tablet 1-2 tablets qid prn diarrhea Instead of Imodium 12/06/20   Gatha Mayer, MD  epoetin alfa-epbx (RETACRIT) 3000 UNIT/ML injection Inject 3,000 Units into the skin every 14 (fourteen) days. 11/21/20   Bhutani, Lavina Hamman, MD  esomeprazole (NEXIUM) 40 MG capsule Take 40 mg by mouth daily before breakfast.      [provider]  fenofibrate (TRICOR) 145 MG tablet Take 1 tablet (145 mg total) by mouth daily. 09/07/20   Arnoldo Lenis, MD  furosemide (LASIX) 40 MG tablet Take 1 tablet (40 mg total) by mouth daily. Patient taking differently: Take 40 mg by mouth 2 (two) times daily. 01/26/16   Reyne Dumas, MD  gabapentin (NEURONTIN) 300 MG capsule Take 300 mg by mouth 3 (three) times daily. 10/20/20   [provider]  hydrocortisone 2.5 % cream Apply topically 2 (two) times daily. 10/19/20   Gatha Mayer, MD  insulin detemir (LEVEMIR FLEXTOUCH) 100 UNIT/ML FlexPen Inject 60 Units into the skin at bedtime. Patient adjust for her sugars 02/27/21   Brita Romp, NP  Insulin Lispro (HUMALOG KWIKPEN [place]) Inject 14-20 Units into the skin 3 (three) times daily with meals. Sliding scale    [provider]  labetalol (NORMODYNE) 300 MG tablet Take 1 tablet (300 mg total) by mouth 2 (two) times daily. 08/22/20   Arnoldo Lenis, MD  levofloxacin (LEVAQUIN) 500  MG tablet Take 500 mg by mouth daily. 02/14/21   [provider]  lipase/protease/amylase (CREON) 36000 UNITS CPEP capsule Take 1 capsule (36,000 Units total) by mouth with snacks. 01/12/21   Barton Dubois, MD  meclizine (ANTIVERT) 25 MG tablet  Take 1 tablet by mouth as needed.    [provider]  nystatin (MYCOSTATIN) 100000 UNIT/ML suspension Take 20 mLs by mouth as needed. 10/05/20   [provider]  omega-3 acid ethyl esters (LOVAZA) 1 g capsule Take 2 capsules by mouth 2 (two) times daily. 10/11/20   [provider]  ondansetron (ZOFRAN) 4 MG tablet Take 4 mg by mouth every 8 (eight) hours as needed for nausea. 08/24/20   [provider]  potassium chloride SA (K-DUR,KLOR-CON) 20 MEQ tablet Take 20 mEq by mouth every morning.    [provider]  predniSONE (DELTASONE) 20 MG tablet Take 20 mg by mouth daily. 02/16/21   [provider]  predniSONE (STERAPRED UNI-PAK 21 TAB) 5 MG (21) TBPK tablet Take by mouth daily. 02/20/21   [provider]  promethazine (PHENERGAN) 25 MG tablet Take 1 tablet (25 mg total) by mouth every 8 (eight) hours as needed for refractory nausea / vomiting. 01/12/21   Barton Dubois, MD  tiZANidine (ZANAFLEX) 4 MG tablet Take 8 mg by mouth at bedtime. 09/06/20   [provider]  venlafaxine XR (EFFEXOR-XR) 75 MG 24 hr capsule Take 75 mg by mouth daily. 10/26/20   [provider]  zolpidem (AMBIEN) 10 MG tablet Take 10 mg by mouth at bedtime. 05/15/20   [provider]    Allergies    Duloxetine hcl, Fluocinolone, Acetaminophen, Amlodipine besylate, Ciprofloxacin, Hydrocodone-acetaminophen, Metformin, Nsaids, Quinolones, Sulfamethoxazole, Valsartan, Cozaar [losartan], Lisinopril, and Tape  Review of Systems   Review of Systems  Constitutional: Negative for chills and fever.  HENT: Positive for congestion, sore throat and voice change.   Eyes: Negative for visual disturbance.   Respiratory: Positive for cough. Negative for shortness of breath.   Cardiovascular: Negative for chest pain.  Gastrointestinal: Positive for diarrhea. Negative for abdominal pain, constipation and vomiting.  Genitourinary: Negative for dysuria and enuresis.  Musculoskeletal: Positive for myalgias. Negative for back pain.  Skin: Negative for rash.  Neurological: Negative for dizziness and headaches.  Hematological: Does not bruise/bleed easily.    Physical Exam Updated Vital Signs BP (!) 205/72 (BP Location: Left Arm)   Pulse 88   Temp 98 F (36.7 C) (Oral)   Resp 20   Ht '5\' 3"'  (1.6 m)   Wt 63.5 kg   LMP  (LMP Unknown)   SpO2 100%   BMI 24.80 kg/m   Physical Exam Vitals and nursing note reviewed.  Constitutional:      General: She is not in acute distress.    Appearance: She is not ill-appearing.  HENT:     Head: Normocephalic and atraumatic.     Nose: Congestion present.     Comments: Patient is noted erythematous turbinates.    Mouth/Throat:     Mouth: Mucous membranes are moist.     Pharynx: Oropharynx is clear. No oropharyngeal exudate or posterior oropharyngeal erythema.  Eyes:     Conjunctiva/sclera: Conjunctivae normal.  Cardiovascular:     Rate and Rhythm: Normal rate and regular rhythm.     Pulses: Normal pulses.     Heart sounds: No murmur heard. No friction rub. No gallop.      Comments: Patient's chest was palpated she was notably tender in her left seventh rib on the posterior aspect.  No deformities noted. Pulmonary:     Effort: No respiratory distress.     Breath sounds: No wheezing, rhonchi or rales.  Abdominal:     Palpations: Abdomen is soft.  Tenderness: There is no abdominal tenderness.  Musculoskeletal:     Right lower leg: No edema.     Left lower leg: No edema.  Skin:    General: Skin is warm and dry.  Neurological:     Mental Status: She is alert.  Psychiatric:        Mood and Affect: Mood normal.     ED Results / Procedures /  Treatments   Labs (all labs ordered are listed, but only abnormal results are displayed) Labs Reviewed  BASIC METABOLIC PANEL - Abnormal; Notable for the following components:      Result Value   Sodium 129 (*)    CO2 19 (*)    Glucose, Bld 305 (*)    BUN 45 (*)    Creatinine, Ser 2.29 (*)    Calcium 8.8 (*)    GFR, Estimated 23 (*)    All other components within normal limits  CBC WITH DIFFERENTIAL/PLATELET - Abnormal; Notable for the following components:   RBC 3.12 (*)    Hemoglobin 9.5 (*)    HCT 30.9 (*)    Platelets 132 (*)    Abs Immature Granulocytes 0.16 (*)    All other components within normal limits  RESP PANEL BY RT-PCR (FLU A&B, COVID) ARPGX2  URINALYSIS, ROUTINE W REFLEX MICROSCOPIC    EKG None  Radiology DG Ribs Unilateral W/Chest Left  Result Date: 03/27/2021 CLINICAL DATA:  70 year old female with history of tenderness along the left seventh rib posteriorly. EXAM: LEFT RIBS AND CHEST - 3+ VIEW COMPARISON:  Chest x-ray 02/27/2021. FINDINGS: Lung volumes are normal. No consolidative airspace disease. No pleural effusions. No pneumothorax. No pulmonary nodule or mass noted. Pulmonary vasculature and the cardiomediastinal silhouette are within normal limits. Atherosclerosis in the thoracic aorta. Orthopedic fixation hardware in the lower cervical spine and lumbar spine. Dedicated views of the left ribs demonstrate no definite acute displaced left-sided rib fractures. IMPRESSION: 1. No acute displaced left-sided rib fractures. 2. No radiographic evidence of acute cardiopulmonary disease. Electronically Signed   By: Vinnie Langton M.D.   On: 03/27/2021 13:18    Procedures Procedures   Medications Ordered in ED Medications - No data to display  ED Course  I have reviewed the triage vital signs and the nursing notes.  Pertinent labs & imaging results that were available during my care of the patient were reviewed by me and considered in my medical decision  making (see chart for details).    MDM Rules/Calculators/A&P                         Initial impression-patient presents with viral URI.  She is alert, does not appear in acute distress, vital signs reassuring.  Suspect patient from a viral infection possibly COVID, will obtain basic lab work-up, chest x-ray, obtain urine and reassess.  Work-up-CBC shows normocytic anemia appears to be baseline for patient.  BMP shows slight hyponatremia 129, decreased CO2 of 19, elevated glucose of 305, BUN of 45 at baseline, elevated creatinine 2.2 baseline around 1.7, chest x-ray negative for acute findings.  Reassessment-unfortunately patient eloped, attempts were made to try to locate the patient but patient cannot be found.  If patient decides to come back we be more than happy to treat her.   Final Clinical Impression(s) / ED Diagnoses Final diagnoses:  Viral illness    Rx / DC Orders ED Discharge Orders    None       Ileene Patrick,  Orson Ape, PA-C 03/27/21 1753    Margette Fast, MD 03/30/21 [phone]

## 2021-03-27 NOTE — ED Triage Notes (Signed)
Tested positive on 5/22. Here today for fever, hoarseness and weakness, states she cannot eat.

## 2021-03-28 ENCOUNTER — Ambulatory Visit (INDEPENDENT_AMBULATORY_CARE_PROVIDER_SITE_OTHER): Payer: 59 | Admitting: Nurse Practitioner

## 2021-03-28 ENCOUNTER — Other Ambulatory Visit: Payer: Self-pay

## 2021-03-28 ENCOUNTER — Encounter: Payer: Self-pay | Admitting: Nurse Practitioner

## 2021-03-28 VITALS — BP 213/66 | HR 83 | Ht 62.0 in | Wt 138.0 lb

## 2021-03-28 DIAGNOSIS — E782 Mixed hyperlipidemia: Secondary | ICD-10-CM | POA: Diagnosis not present

## 2021-03-28 DIAGNOSIS — I10 Essential (primary) hypertension: Secondary | ICD-10-CM | POA: Diagnosis not present

## 2021-03-28 DIAGNOSIS — E1121 Type 2 diabetes mellitus with diabetic nephropathy: Secondary | ICD-10-CM

## 2021-03-28 MED ORDER — LEVEMIR FLEXTOUCH 100 UNIT/ML ~~LOC~~ SOPN: 80.0000 [IU] | PEN_INJECTOR | Freq: Every day | SUBCUTANEOUS | 3 refills | Status: DC

## 2021-03-28 NOTE — Patient Instructions (Signed)

## 2021-03-28 NOTE — Progress Notes (Signed)
Endocrinology Follow Up Note       03/28/2021, 3:10 PM   Subjective:    Patient ID: Heather [name], female    DOB: 11/28/1950.  KENZEE [name] is being seen in follow up after being seen in consultation for management of currently uncontrolled symptomatic diabetes requested by  [name], MD.   Past Medical History:  Diagnosis Date  . Acute renal failure superimposed on stage 3 chronic kidney disease ([place]) 01/21/2016  . Anemia   . Anxiety   . B12 deficiency   . Brain tumor (benign) ([place]) 10/29/2020  . Cellulitis and abscess    Abdomen and buttocks  . CKD (chronic kidney disease) stage 3, GFR 30-59 ml/min (HCC)   . Closed fracture of distal end of femur, unspecified fracture morphology, initial encounter ([place])   . Depression   . Diabetic neuropathy (HCC)    feet  . DJD (degenerative joint disease)    Right forminal stenosis C4-5  . Essential hypertension   . Femur fracture, right ([place]) 03/30/2017  . Folliculitis   . GERD (gastroesophageal reflux disease)   . Gout   . Headache(784.0)   . Hiatal hernia   . History of blood transfusion   . History of bronchitis   . History of cardiac catheterization    No significant CAD 2012  . History of kidney stones    surgery to remove  . Insomnia   . Irritable bowel syndrome   . Mixed hyperlipidemia   . Osteoarthritis   . Palpitations   . Pneumonia   . Reflux esophagitis   . Salmonella   . Stroke ([place])   . Tinnitus    Right  . Type 2 diabetes mellitus ([place])     Past Surgical History:  Procedure Laterality Date  . BACK SURGERY    . c-spine surgery     03/2007  . CARDIAC CATHETERIZATION    . CHOLECYSTECTOMY    . COLONOSCOPY  04/21/2011; 05/29/11   6/12: [place] - ?AVM at IC valve, inflammatory changes at [facility] valve [facility]); 7/12 - [facility]; IC valve erosions, look chronic and probably Crohn's per path  . colonoscopy  2017    [place]: random biopsies normal. TI normal  . ESOPHAGOGASTRODUODENOSCOPY  05/29/11   normal  . ESOPHAGOGASTRODUODENOSCOPY N/A 01/21/2016   Dr. Michail Sermon: normal  . EYE SURGERY Bilateral    cataracts removed  . FEMUR IM NAIL Right 04/10/2018   Procedure: INTRAMEDULLARY (IM) NAIL FEMORAL;  Surgeon: Paralee Cancel, MD;  Location: [facility];  Service: Orthopedics;  Laterality: Right;  . FRACTURE SURGERY    . GIVENS CAPSULE STUDY  11/17/2020  . HARDWARE REMOVAL Right 04/10/2018   Procedure: HARDWARE REMOVAL RIGHT DISTAL FEMUR;  Surgeon: Paralee Cancel, MD;  Location: [facility];  Service: Orthopedics;  Laterality: Right;  . HERNIA REPAIR    . IR FLUORO GUIDE CV LINE RIGHT  04/01/2017  . IR US GUIDE VASC ACCESS RIGHT  04/01/2017  . JOINT REPLACEMENT Right    hip  . OPEN REDUCTION INTERNAL FIXATION (ORIF) DISTAL RADIAL FRACTURE Right 05/28/2019   Procedure: OPEN REDUCTION INTERNAL FIXATION (ORIF) DISTAL RADIAL FRACTURE;  Surgeon: Verner Mould, MD;  Location:  [facility];  Service: Orthopedics;  Laterality: Right;  with block 90 minutes  . ORIF FEMUR FRACTURE Right 03/31/2017   Procedure: OPEN REDUCTION INTERNAL FIXATION (ORIF) DISTAL FEMUR FRACTURE;  Surgeon: Paralee Cancel, MD;  Location: [facility];  Service: Orthopedics;  Laterality: Right;  . removal of kidney stone    . Right knee arthroscopic surgery    . TONSILLECTOMY    . TOTAL ABDOMINAL HYSTERECTOMY      Social History   Socioeconomic History  . Marital status: Married    Spouse name: Not on file  . Number of children: 2  . Years of education: Not on file  . Highest education level: Not on file  Occupational History  . Occupation: Disabled  Tobacco Use  . Smoking status: Never Smoker  . Smokeless tobacco: Never Used  Vaping Use  . Vaping Use: Never used  Substance and Sexual Activity  . Alcohol use: No  . Drug use: No  . Sexual activity: Not on file    Comment: Hysterectomy  Other Topics Concern  . 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    Social Determinants of Health   Financial Resource Strain: Not on file  Food Insecurity: Not on file  Transportation Needs: Not on file  Physical Activity: Not on file  Stress: Not on file  Social Connections: Not on file    Family History  Problem Relation Age of Onset  . Diabetes Mother   . Colon cancer Brother        POSSIBLY. Patient states diagnosed at age 57, then later states age 37. unclear and limited historian  . Esophageal cancer Neg Hx   . Rectal cancer Neg Hx   . Stomach cancer Neg Hx     Outpatient Encounter Medications as of 03/28/2021  Medication Sig  . acetaminophen (TYLENOL) 650 MG CR tablet Take 650 mg by mouth every 8 (eight) hours as needed for pain.  . Adalimumab (HUMIRA PEN) 40 MG/0.4ML PNKT Inject 40 mg into the skin every 14 (fourteen) days. After the completion of the starter kit  . Ascorbic Acid (VITAMIN C WITH ROSE HIPS) 500 MG tablet Take 500 mg by mouth every morning.  Marland Kitchen atorvastatin (LIPITOR) 80 MG tablet Take 80 mg by mouth every morning.  . butalbital-acetaminophen-caffeine (FIORICET) 50-325-40 MG tablet Take 1 tablet by mouth every 6 (six) hours as needed for migraine.  . Cholecalciferol (VITAMIN D) 2000 units CAPS Take 2,000 Units by mouth every morning.   . citalopram (CELEXA) 20 MG tablet Take 1 tablet (20 mg total) by mouth daily.  . cloNIDine (CATAPRES) 0.2 MG tablet Take 0.5 tablets (0.1 mg total) by mouth 2 (two) times daily.  . cyanocobalamin (,VITAMIN B-12,) 1000 MCG/ML injection Inject 1,000 mcg into the muscle every 30 (thirty) days.    Marland Kitchen dicyclomine (BENTYL) 20 MG tablet Take 1 tablet (20 mg total) by mouth 3 (three) times daily before meals.  .  diphenoxylate-atropine (LOMOTIL) 2.5-0.025 MG tablet 1-2 tablets qid prn diarrhea Instead of Imodium  . epoetin alfa-epbx (RETACRIT) 3000 UNIT/ML injection Inject 3,000 Units into the skin every 14 (fourteen) days.  Marland Kitchen esomeprazole (NEXIUM) 40  MG capsule Take 40 mg by mouth daily before breakfast.    . fenofibrate (TRICOR) 145 MG tablet Take 1 tablet (145 mg total) by mouth daily.  . furosemide (LASIX) 40 MG tablet Take 1 tablet (40 mg total) by mouth daily. (Patient taking differently: Take 40 mg by mouth 2 (two) times daily.)  . gabapentin (NEURONTIN) 300 MG capsule Take 300 mg by mouth 3 (three) times daily.  . hydrocortisone 2.5 % cream Apply topically 2 (two) times daily.  . insulin detemir (LEVEMIR FLEXTOUCH) 100 UNIT/ML FlexPen Inject 80 Units into the skin at bedtime. Patient adjust for her sugars  . Insulin Lispro (HUMALOG KWIKPEN [place]) Inject 20-26 Units into the skin 3 (three) times daily with meals. Sliding scale  . labetalol (NORMODYNE) 300 MG tablet Take 1 tablet (300 mg total) by mouth 2 (two) times daily.  Marland Kitchen levofloxacin (LEVAQUIN) 500 MG tablet Take 500 mg by mouth daily.  . lipase/protease/amylase (CREON) 36000 UNITS CPEP capsule Take 1 capsule (36,000 Units total) by mouth with snacks.  . meclizine (ANTIVERT) 25 MG tablet Take 1 tablet by mouth as needed.  . nystatin (MYCOSTATIN) 100000 UNIT/ML suspension Take 20 mLs by mouth as needed.  Marland Kitchen omega-3 acid ethyl esters (LOVAZA) 1 g capsule Take 2 capsules by mouth 2 (two) times daily.  . ondansetron (ZOFRAN) 4 MG tablet Take 4 mg by mouth every 8 (eight) hours as needed for nausea.  . potassium chloride SA (K-DUR,KLOR-CON) 20 MEQ tablet Take 20 mEq by mouth every morning.  . predniSONE (DELTASONE) 20 MG tablet Take 20 mg by mouth daily.  . predniSONE (STERAPRED UNI-PAK 21 TAB) 5 MG (21) TBPK tablet Take by mouth daily.  . promethazine (PHENERGAN) 25 MG tablet Take 1 tablet (25 mg total) by mouth every 8 (eight) hours as needed for  refractory nausea / vomiting.  Marland Kitchen tiZANidine (ZANAFLEX) 4 MG tablet Take 8 mg by mouth at bedtime.  Marland Kitchen venlafaxine XR (EFFEXOR-XR) 75 MG 24 hr capsule Take 75 mg by mouth daily.  Marland Kitchen zolpidem (AMBIEN) 10 MG tablet Take 10 mg by mouth at bedtime.  . [DISCONTINUED] insulin detemir (LEVEMIR FLEXTOUCH) 100 UNIT/ML FlexPen Inject 60 Units into the skin at bedtime. Patient adjust for her sugars   No facility-administered encounter medications on file as of 03/28/2021.    ALLERGIES: Allergies  Allergen Reactions  . Duloxetine Hcl Swelling  . Fluocinolone Swelling  . Acetaminophen Itching and Swelling    Pt states "some swelling"  . Amlodipine Besylate Swelling    Swelling of feet, fluid retention  . Ciprofloxacin Nausea And Vomiting and Swelling    Swelling of face, jaw, and lips  . Hydrocodone-Acetaminophen Itching  . Metformin Other (See Comments)    REACTION: GI UPSET  . Nsaids Other (See Comments)    "HAS BLEEDING ULCERS"  . Quinolones Swelling  . Sulfamethoxazole Swelling    Swelling of feet, legs  . Valsartan Swelling  . Cozaar [Losartan] Swelling and Rash  . Lisinopril Swelling and Rash    Oral swelling, and red streaks on arms/stomach  . Tape Itching and Rash    Paper tape can only be used on this patient    VACCINATION STATUS: Immunization History  Administered Date(s) Administered  . Influenza,inj,Quad PF,6+ Mos 12/26/2016  . Pneumococcal Polysaccharide-23 12/26/2016    Diabetes She presents for her follow-up diabetic visit. She has type 2 diabetes mellitus. Onset time: diagnosed at approx age of 60. Her disease course has been worsening. There are no hypoglycemic associated  symptoms. Pertinent negatives for hypoglycemia include no headaches, sweats or tremors. Associated symptoms include blurred vision, fatigue, polydipsia and polyuria. Pertinent negatives for diabetes include no weakness and no weight loss. There are no hypoglycemic complications. Symptoms are stable.  Diabetic complications include heart disease, nephropathy and peripheral neuropathy. (Has been hospitalized multiple times for high and low glucose) Risk factors for coronary artery disease include diabetes mellitus, dyslipidemia, family history, hypertension, post-menopausal, sedentary lifestyle and tobacco exposure. Current diabetic treatment includes intensive insulin program. She is compliant with treatment most of the time. Her weight is stable. She is following a generally unhealthy diet. When asked about meal planning, she reported none. She has had a previous visit with a dietitian (has seen one in the past). She rarely participates in exercise. Her home blood glucose trend is fluctuating dramatically. Her breakfast blood glucose range is generally >200 mg/dl. Her lunch blood glucose range is generally >200 mg/dl. Her dinner blood glucose range is generally >200 mg/dl. Her overall blood glucose range is >200 mg/dl. (She presents today with her meter and logs showing persistent severe hyperglycemia.  She is once again being treated with oral steroids for URI symptoms and states she just had another steroid injection at her PCP office for this.  Her BP continues to be severely elevated as well.  There are no episodes of hypoglycemia noted.) An ACE inhibitor/angiotensin II receptor blocker is contraindicated (reported allergy to ACE and ARBs). She does not see a podiatrist.Eye exam is current.  Hypertension This is a chronic problem. The current episode started more than 1 year ago. The problem has been rapidly worsening since onset. The problem is uncontrolled. Associated symptoms include blurred vision, malaise/fatigue and shortness of breath. Pertinent negatives include no headaches or sweats. There are no associated agents to hypertension. Risk factors for coronary artery disease include diabetes mellitus, dyslipidemia, family history, post-menopausal state and sedentary lifestyle. Past treatments include  diuretics, beta blockers and alpha 1 blockers. The current treatment provides mild improvement. Compliance problems include diet and exercise.  Hypertensive end-organ damage includes kidney disease and CAD/MI. Identifiable causes of hypertension include chronic renal disease.  Hyperlipidemia This is a chronic problem. The problem is uncontrolled. Recent lipid tests were reviewed and are high. Exacerbating diseases include chronic renal disease and diabetes. Factors aggravating her hyperlipidemia include beta blockers and fatty foods. Associated symptoms include shortness of breath. Current antihyperlipidemic treatment includes statins and fibric acid derivatives. The current treatment provides mild improvement of lipids. Compliance problems include adherence to diet and adherence to exercise.  Risk factors for coronary artery disease include diabetes mellitus, dyslipidemia, family history, hypertension, post-menopausal and a sedentary lifestyle.     Review of systems  Constitutional: + Minimally fluctuating body weight,  current Body mass index is 25.24 kg/m. , + fatigue, no subjective hyperthermia, no subjective hypothermia, + Headache Eyes: + blurry vision, no xerophthalmia ENT: no sore throat, no nodules palpated in throat, no dysphagia/odynophagia, + hoarseness Cardiovascular: no chest pain,+ shortness of breath, no palpitations, no leg swelling Respiratory: + acute cough (undergoing treatment for bronchitis- currently on oral prednisone and just had steroid shot at PCP), no shortness of breath Gastrointestinal: no nausea/vomiting/diarrhea Musculoskeletal: no muscle/joint aches, recently had multiple falls (contributes recent back surgery to her fall) Skin: no rashes, no hyperemia Neurological: no tremors, no numbness, no tingling, no dizziness Psychiatric: no depression, no anxiety  Objective:     BP (!) 213/66   Pulse 83   Ht '5\' 2"'  (1.575 m)  Wt 138 lb (62.6 kg)   LMP  (LMP Unknown)    BMI 25.24 kg/m   Wt Readings from Last 3 Encounters:  03/28/21 138 lb (62.6 kg)  03/27/21 140 lb (63.5 kg)  03/16/21 132 lb 4.4 oz (60 kg)     BP Readings from Last 3 Encounters:  03/28/21 (!) 213/66  03/27/21 (!) 205/72  03/16/21 (!) 204/87     Physical Exam- Limited  Constitutional:  Body mass index is 25.24 kg/m. , ill-appearing, normal state of mind Eyes:  EOMI, no exophthalmos Neck: Supple Cardiovascular: RRR, no murmurs, rubs, or gallops, no edema Respiratory: + cough, dyspnea on exertion, no rales, rhonchi,  Musculoskeletal: no gross deformities, strength intact in all four extremities, no gross restriction of joint movements Skin:  no rashes, no hyperemia Neurological: no tremor with outstretched hands    CMP ( most recent) CMP     Component Value Date/Time   NA 129 (L) 03/27/2021 1349   NA 143 06/17/2017 1220   K 4.3 03/27/2021 1349   CL 98 03/27/2021 1349   CO2 19 (L) 03/27/2021 1349   GLUCOSE 305 (H) 03/27/2021 1349   BUN 45 (H) 03/27/2021 1349   BUN 34 (H) 06/17/2017 1220   CREATININE 2.29 (H) 03/27/2021 1349   CALCIUM 8.8 (L) 03/27/2021 1349   CALCIUM 9.8 03/02/2021 1401   PROT 6.1 (L) 01/11/2021 0523   PROT 7.0 06/17/2017 1220   ALBUMIN 3.5 03/02/2021 1401   ALBUMIN 4.4 06/17/2017 1220   AST 23 01/11/2021 0523   ALT 16 01/11/2021 0523   ALKPHOS 60 01/11/2021 0523   BILITOT 0.2 (L) 01/11/2021 0523   BILITOT 0.3 06/17/2017 1220   GFRNONAA 23 (L) 03/27/2021 1349   GFRAA 56 (L) 05/19/2020 0640     Diabetic Labs (most recent): Lab Results  Component Value Date   HGBA1C 11.0 (H) 01/06/2021   HGBA1C 6.9 (H) 05/17/2020   HGBA1C 6.0 08/06/2018     Lipid Panel ( most recent) Lipid Panel     Component Value Date/Time   CHOL 405 (H) 08/22/2020 1426   TRIG 597 (H) 01/08/2021 1100   HDL 30 (L) 08/22/2020 1426   CHOLHDL 10.7 05/17/2020 0445   VLDL UNABLE TO CALCULATE IF TRIGLYCERIDE OVER 400 mg/dL 05/17/2020 0445   LDLCALC Comment (A)  08/22/2020 1426   LDLDIRECT 129 (H) 08/22/2020 1426   LDLDIRECT 96.9 05/17/2020 0445   LABVLDL Comment (A) 08/22/2020 1426      Lab Results  Component Value Date   TSH 2.19 03/10/2020   TSH 0.78 08/06/2018   TSH 1.489 12/25/2016           Assessment & Plan:   1) Uncontrolled Type 2 Diabetes with nephropathy  She presents today with her meter and logs showing persistent severe hyperglycemia.  She is once again being treated with oral steroids for URI symptoms and states she just had another steroid injection at her PCP office for this.  Her BP continues to be severely elevated as well.  There are no episodes of hypoglycemia noted.  - [name] has currently uncontrolled symptomatic type 2 DM since 70 years of age, with most recent A1c of 11 %.   -Recent labs reviewed.  - I had a long discussion with her about the progressive nature of diabetes and the pathology behind its complications. -her diabetes is complicated by CKD, CAD, neuropathy, and pancreatitis and she remains at a high risk for more acute and chronic complications which include CAD,  CVA, CKD, retinopathy, and neuropathy. These are all discussed in detail with her.  - Nutritional counseling repeated at each appointment due to patients tendency to fall back in to old habits.  - The patient admits there is a room for improvement in their diet and drink choices. -  Suggestion is made for the patient to avoid simple carbohydrates from their diet including Cakes, Sweet Desserts / Pastries, Ice Cream, Soda (diet and regular), Sweet Tea, Candies, Chips, Cookies, Sweet Pastries,  Store Bought Juices, Alcohol in Excess of  1-2 drinks a day, Artificial Sweeteners, Coffee Creamer, and "Sugar-free" Products. This will help patient to have stable blood glucose profile and potentially avoid unintended weight gain.   - I encouraged the patient to switch to  unprocessed or minimally processed complex starch and increased  protein intake (animal or plant source), fruits, and vegetables.   - Patient is advised to stick to a routine mealtimes to eat 3 meals  a day and avoid unnecessary snacks ( to snack only to correct hypoglycemia).  - I have approached her with the following individualized plan to manage  her diabetes and patient agrees:   -Control of her diabetes will not be possible with the high-dose steroids she continues to need for URI symptoms.  -She is advised to seek treatment in the ED for severe hyperglycemia and severe hypertension.  She has been to the ED several times but feels like her concerns are not being heard.  -If she does not seek care in the ED, it is advised that she increase her Levemir to 80 units SQ nightly and increase her Humalog to 20-26 units TID with meals if glucose is above 90 and she is eating.   -she is encouraged to continue monitoring glucose 4 times daily, before meals and before bed, and to call the clinic if she has readings less than 70 or greater than 300 for 3 tests in a row.  - she is warned not to take insulin without proper monitoring per orders. - Adjustment parameters are given to her for hypo and hyperglycemia in writing.  -She reportedly does not tolerate Metformin due to GI side effects.  - Specific targets for  A1c;  LDL, HDL,  and Triglycerides were discussed with the patient.  2) Blood Pressure /Hypertension:  her blood pressure is not controlled to target.  Her blood pressure is extremely elevated.   she is advised to continue her current medications including Lasix 40 mg po twice daily, Labetalol 300 mg p.o. twice daily, and Clonidine 0.2 mg po BID.  I also recommended she go straight to the ED for further evaluation.  3) Lipids/Hyperlipidemia:    Review of her recent lipid panel from 01/08/21 showed uncontrolled triglycerides of 597, LDL unable to be calculated .  she  is advised to continue Lipitor 80 mg po daily at bedtime and Fenofibrate 145 mg daily.   Side effects and precautions discussed with her.  4)  Weight/Diet:  her Body mass index is 25.24 kg/m.  -   she is not a candidate for weight loss.  Exercise, and detailed carbohydrates information provided  -  detailed on discharge instructions.  5) Chronic Care/Health Maintenance: -she is on Statin medications and is encouraged to initiate and continue to follow up with Ophthalmology, Dentist, Podiatrist at least yearly or according to recommendations, and advised to stay away from smoking. I have recommended yearly flu vaccine and pneumonia vaccine at least every 5 years; moderate intensity exercise for  up to 150 minutes weekly; and sleep for at least 7 hours a day.  - she is advised to maintain close follow up with [name], MD for primary care needs, as well as her other providers for optimal and coordinated care.       I spent 39 minutes in the care of the patient today including review of labs from [place], Lipids, Thyroid Function, Hematology (current and previous including abstractions from other facilities); face-to-face time discussing  her blood glucose readings/logs, discussing hypoglycemia and hyperglycemia episodes and symptoms, medications doses, her options of short and long term treatment based on the latest standards of care / guidelines;  discussion about incorporating lifestyle medicine;  and documenting the encounter.    Please refer to Patient Instructions for Blood Glucose Monitoring and Insulin/Medications Dosing Guide"  in media tab for additional information. Please  also refer to " Patient Self Inventory" in the Media  tab for reviewed elements of pertinent patient history.  [name] participated in the discussions, expressed understanding, and voiced agreement with the above plans.  All questions were answered to her satisfaction. she is encouraged to contact clinic should she have any questions or concerns prior to her return visit.   Follow up  plan: - Return in about 2 weeks (around 04/11/2021) for Diabetes F/U, Bring meter and logs.  Rayetta Pigg, [facility] [address] [place], [place] [address] Phone: [phone] Fax: [phone]  03/28/2021, 3:10 PM

## 2021-03-30 ENCOUNTER — Encounter (HOSPITAL_COMMUNITY)
Admission: RE | Admit: 2021-03-30 | Discharge: 2021-03-30 | Disposition: A | Discharge status: Home / Self Care | Payer: 59 | Source: Ambulatory Visit | Attending: Nephrology | Admitting: Nephrology

## 2021-03-30 DIAGNOSIS — U071 COVID-19: Secondary | ICD-10-CM | POA: Insufficient documentation

## 2021-04-01 DIAGNOSIS — E871 Hypo-osmolality and hyponatremia: Secondary | ICD-10-CM | POA: Insufficient documentation

## 2021-04-06 ENCOUNTER — Ambulatory Visit: Payer: 59 | Admitting: Nurse Practitioner

## 2021-04-20 ENCOUNTER — Encounter (HOSPITAL_COMMUNITY): Payer: Self-pay

## 2021-04-20 ENCOUNTER — Other Ambulatory Visit: Payer: Self-pay

## 2021-04-20 ENCOUNTER — Encounter (HOSPITAL_COMMUNITY)
Admission: RE | Admit: 2021-04-20 | Discharge: 2021-04-20 | Disposition: A | Discharge status: Home / Self Care | Payer: 59 | Source: Ambulatory Visit | Attending: Nephrology | Admitting: Nephrology

## 2021-04-20 DIAGNOSIS — N184 Chronic kidney disease, stage 4 (severe): Secondary | ICD-10-CM | POA: Insufficient documentation

## 2021-04-20 LAB — CBC
HCT: 31.9 % — ABNORMAL LOW (ref 36.0–46.0)
Hemoglobin: 10.1 g/dL — ABNORMAL LOW (ref 12.0–15.0)
MCH: 30.7 pg (ref 26.0–34.0)
MCHC: 31.7 g/dL (ref 30.0–36.0)
MCV: 97 fL (ref 80.0–100.0)
Platelets: 342 10*3/uL (ref 150–400)
RBC: 3.29 MIL/uL — ABNORMAL LOW (ref 3.87–5.11)
RDW: 15.1 % (ref 11.5–15.5)
WBC: 10.8 10*3/uL — ABNORMAL HIGH (ref 4.0–10.5)
nRBC: 0 % (ref 0.0–0.2)

## 2021-04-20 LAB — IRON AND TIBC
Iron: 75 ug/dL (ref 28–170)
Saturation Ratios: 20 % (ref 10.4–31.8)
TIBC: 382 ug/dL (ref 250–450)
UIBC: 307 ug/dL

## 2021-04-20 LAB — RENAL FUNCTION PANEL
Albumin: 4 g/dL (ref 3.5–5.0)
Anion gap: 14 (ref 5–15)
BUN: 56 mg/dL — ABNORMAL HIGH (ref 8–23)
CO2: 20 mmol/L — ABNORMAL LOW (ref 22–32)
Calcium: 9.9 mg/dL (ref 8.9–10.3)
Chloride: 102 mmol/L (ref 98–111)
Creatinine, Ser: 2.11 mg/dL — ABNORMAL HIGH (ref 0.44–1.00)
GFR, Estimated: 25 mL/min — ABNORMAL LOW (ref >60)
Glucose, Bld: 62 mg/dL — ABNORMAL LOW (ref 70–99)
Phosphorus: 3.7 mg/dL (ref 2.5–4.6)
Potassium: 4.3 mmol/L (ref 3.5–5.1)
Sodium: 136 mmol/L (ref 135–145)

## 2021-04-20 LAB — PROTEIN / CREATININE RATIO, URINE
Creatinine, Urine: 40.6 mg/dL
Protein Creatinine Ratio: 2.49 mg/mg{Cre} — ABNORMAL HIGH (ref 0.00–0.15)
Total Protein, Urine: 101 mg/dL

## 2021-04-20 LAB — POCT HEMOGLOBIN-HEMACUE: Hemoglobin: 11.3 g/dL — ABNORMAL LOW (ref 12.0–15.0)

## 2021-04-20 MED ORDER — EPOETIN ALFA-EPBX 3000 UNIT/ML IJ SOLN: 3000.0000 [IU] | Freq: Once | INTRAMUSCULAR | Status: DC

## 2021-04-21 LAB — PTH, INTACT AND CALCIUM
Calcium, Total (PTH): 10 mg/dL (ref 8.7–10.3)
PTH: 111 pg/mL — ABNORMAL HIGH (ref 15–65)

## 2021-04-26 ENCOUNTER — Ambulatory Visit (INDEPENDENT_AMBULATORY_CARE_PROVIDER_SITE_OTHER): Payer: 59 | Admitting: Nurse Practitioner

## 2021-04-26 ENCOUNTER — Encounter: Payer: Self-pay | Admitting: Nurse Practitioner

## 2021-04-26 ENCOUNTER — Other Ambulatory Visit: Payer: Self-pay

## 2021-04-26 VITALS — BP 192/93 | HR 89 | Ht 62.0 in | Wt 145.0 lb

## 2021-04-26 DIAGNOSIS — E1121 Type 2 diabetes mellitus with diabetic nephropathy: Secondary | ICD-10-CM | POA: Diagnosis not present

## 2021-04-26 DIAGNOSIS — I10 Essential (primary) hypertension: Secondary | ICD-10-CM | POA: Diagnosis not present

## 2021-04-26 DIAGNOSIS — E782 Mixed hyperlipidemia: Secondary | ICD-10-CM | POA: Diagnosis not present

## 2021-04-26 LAB — POCT GLYCOSYLATED HEMOGLOBIN (HGB A1C): Hemoglobin A1C: 12.3 % — AB (ref 4.0–5.6)

## 2021-04-26 MED ORDER — LEVEMIR FLEXTOUCH 100 UNIT/ML ~~LOC~~ SOPN: 90.0000 [IU] | PEN_INJECTOR | Freq: Every day | SUBCUTANEOUS | 3 refills | Status: DC

## 2021-04-26 NOTE — Progress Notes (Signed)
Endocrinology Follow Up Note       04/26/2021, 4:22 PM   Subjective:    Patient ID: Heather [name], female    DOB: 27-Jan-1951.  Heather [name] is being seen in follow up after being seen in consultation for management of currently uncontrolled symptomatic diabetes requested by  [name], MD.   Past Medical History:  Diagnosis Date   Acute renal failure superimposed on stage 3 chronic kidney disease ([place]) 01/21/2016   Anemia    Anxiety    B12 deficiency    Brain tumor (benign) ([place]) 10/29/2020   Cellulitis and abscess    Abdomen and buttocks   CKD (chronic kidney disease) stage 3, GFR 30-59 ml/min (HCC)    Closed fracture of distal end of femur, unspecified fracture morphology, initial encounter ([place])    Depression    Diabetic neuropathy (HCC)    feet   DJD (degenerative joint disease)    Right forminal stenosis C4-5   Essential hypertension    Femur fracture, right ([place]) 12/03/2351   Folliculitis    GERD (gastroesophageal reflux disease)    Gout    Headache(784.0)    Hiatal hernia    History of blood transfusion    History of bronchitis    History of cardiac catheterization    No significant CAD 2012   History of kidney stones    surgery to remove   Insomnia    Irritable bowel syndrome    Mixed hyperlipidemia    Osteoarthritis    Palpitations    Pneumonia    Reflux esophagitis    Salmonella    Stroke [facility])    Tinnitus    Right   Type 2 diabetes mellitus ([place])     Past Surgical History:  Procedure Laterality Date   BACK SURGERY     c-spine surgery     03/2007   CARDIAC CATHETERIZATION     CHOLECYSTECTOMY     COLONOSCOPY  04/21/2011; 05/29/11   6/12: [place] - ?AVM at IC valve, inflammatory changes at IC valve [facility]); 7/12 - [facility]; IC valve erosions, look chronic and probably Crohn's per path   colonoscopy  2017   [place]: random biopsies normal. TI normal    ESOPHAGOGASTRODUODENOSCOPY  05/29/11   normal   ESOPHAGOGASTRODUODENOSCOPY N/A 01/21/2016   Dr. Michail Sermon: normal   EYE SURGERY Bilateral    cataracts removed   FEMUR IM NAIL Right 04/10/2018   Procedure: INTRAMEDULLARY (IM) NAIL FEMORAL;  Surgeon: Paralee Cancel, MD;  Location: [facility];  Service: Orthopedics;  Laterality: Right;   FRACTURE SURGERY     GIVENS CAPSULE STUDY  11/17/2020   HARDWARE REMOVAL Right 04/10/2018   Procedure: HARDWARE REMOVAL RIGHT DISTAL FEMUR;  Surgeon: Paralee Cancel, MD;  Location: [facility];  Service: Orthopedics;  Laterality: Right;   HERNIA REPAIR     IR FLUORO GUIDE CV LINE RIGHT  04/01/2017   IR US GUIDE VASC ACCESS RIGHT  04/01/2017   JOINT REPLACEMENT Right    hip   OPEN REDUCTION INTERNAL FIXATION (ORIF) DISTAL RADIAL FRACTURE Right 05/28/2019   Procedure: OPEN REDUCTION INTERNAL FIXATION (ORIF) DISTAL RADIAL FRACTURE;  Surgeon: Verner Mould, MD;  Location:  [facility];  Service: Orthopedics;  Laterality: Right;  with block 90 minutes   ORIF FEMUR FRACTURE Right 03/31/2017   Procedure: OPEN REDUCTION INTERNAL FIXATION (ORIF) DISTAL FEMUR FRACTURE;  Surgeon: Paralee Cancel, MD;  Location: [facility];  Service: Orthopedics;  Laterality: Right;   removal of kidney stone     Right knee arthroscopic surgery     TONSILLECTOMY     TOTAL ABDOMINAL HYSTERECTOMY      Social History   Socioeconomic History   Marital status: Married    Spouse name: Not on file   Number of children: 2   Years of education: Not on file   Highest education level: Not on file  Occupational History   Occupation: Disabled  Tobacco Use   Smoking status: Never   Smokeless tobacco: Never  Vaping Use   Vaping Use: Never used  Substance and Sexual Activity   Alcohol use: No   Drug use: No   Sexual activity: Not on file    Comment: Hysterectomy  Other Topics Concern   Not on file  Social History Narrative   Patient is married 2 children she is disabled, 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    Social Determinants of Health   Financial Resource Strain: Not on file  Food Insecurity: Not on file  Transportation Needs: Not on file  Physical Activity: Not on file  Stress: Not on file  Social Connections: Not on file    Family History  Problem Relation Age of Onset   Diabetes Mother    Colon cancer Brother        POSSIBLY. Patient states diagnosed at age 21, then later states age 75. unclear and limited historian   Esophageal cancer Neg Hx    Rectal cancer Neg Hx    Stomach cancer Neg Hx     Outpatient Encounter Medications as of 04/26/2021  Medication Sig   acetaminophen (TYLENOL) 650 MG CR tablet Take 650 mg by mouth every 8 (eight) hours as needed for pain.   Adalimumab (HUMIRA PEN) 40 MG/0.4ML PNKT Inject 40 mg into the skin every 14 (fourteen) days. After the completion of the starter kit   Ascorbic Acid (VITAMIN C WITH ROSE HIPS) 500 MG tablet Take 500 mg by mouth every morning.   atorvastatin (LIPITOR) 80 MG tablet Take 80 mg by mouth every morning.   butalbital-acetaminophen-caffeine (FIORICET) 50-325-40 MG tablet Take 1 tablet by mouth every 6 (six) hours as needed for migraine.   Cholecalciferol (VITAMIN D) 2000 units CAPS Take 2,000 Units by mouth every morning.    citalopram (CELEXA) 20 MG tablet Take 1 tablet (20 mg total) by mouth daily.   cloNIDine (CATAPRES) 0.2 MG tablet Take 0.5 tablets (0.1 mg total) by mouth 2 (two) times daily.   cyanocobalamin (,VITAMIN B-12,) 1000 MCG/ML injection Inject 1,000 mcg into the muscle every 30 (thirty) days.     dicyclomine (BENTYL) 20 MG tablet Take 1 tablet (20 mg total) by mouth 3 (three) times daily before meals.   diphenoxylate-atropine (LOMOTIL) 2.5-0.025 MG tablet 1-2 tablets qid prn diarrhea Instead of Imodium   epoetin alfa-epbx  (RETACRIT) 3000 UNIT/ML injection Inject 3,000 Units into the skin every 14 (fourteen) days.   esomeprazole (NEXIUM) 40 MG capsule  Take 40 mg by mouth daily before breakfast.     fenofibrate (TRICOR) 145 MG tablet Take 1 tablet (145 mg total) by mouth daily.   furosemide (LASIX) 40 MG tablet Take 1 tablet (40 mg total) by mouth daily. (Patient taking differently: Take 40 mg by mouth 2 (two) times daily.)   gabapentin (NEURONTIN) 300 MG capsule Take 300 mg by mouth 3 (three) times daily.   hydrocortisone 2.5 % cream Apply topically 2 (two) times daily.   insulin detemir (LEVEMIR FLEXTOUCH) 100 UNIT/ML FlexPen Inject 90 Units into the skin at bedtime.   Insulin Lispro (HUMALOG KWIKPEN [place]) Inject 22-28 Units into the skin 3 (three) times daily with meals. Sliding scale   labetalol (NORMODYNE) 300 MG tablet Take 1 tablet (300 mg total) by mouth 2 (two) times daily.   levofloxacin (LEVAQUIN) 500 MG tablet Take 500 mg by mouth daily.   lipase/protease/amylase (CREON) 36000 UNITS CPEP capsule Take 1 capsule (36,000 Units total) by mouth with snacks.   meclizine (ANTIVERT) 25 MG tablet Take 1 tablet by mouth as needed.   nystatin (MYCOSTATIN) 100000 UNIT/ML suspension Take 20 mLs by mouth as needed.   omega-3 acid ethyl esters (LOVAZA) 1 g capsule Take 2 capsules by mouth 2 (two) times daily.   ondansetron (ZOFRAN) 4 MG tablet Take 4 mg by mouth every 8 (eight) hours as needed for nausea.   potassium chloride SA (K-DUR,KLOR-CON) 20 MEQ tablet Take 20 mEq by mouth every morning.   promethazine (PHENERGAN) 25 MG tablet Take 1 tablet (25 mg total) by mouth every 8 (eight) hours as needed for refractory nausea / vomiting.   tiZANidine (ZANAFLEX) 4 MG tablet Take 8 mg by mouth at bedtime.   venlafaxine XR (EFFEXOR-XR) 75 MG 24 hr capsule Take 75 mg by mouth daily.   [DISCONTINUED] insulin detemir (LEVEMIR FLEXTOUCH) 100 UNIT/ML FlexPen Inject 80 Units into the skin at bedtime. Patient adjust for her sugars    [DISCONTINUED] predniSONE (DELTASONE) 20 MG tablet Take 20 mg by mouth daily.   [DISCONTINUED] predniSONE (STERAPRED UNI-PAK 21 TAB) 5 MG (21) TBPK tablet Take by mouth daily.   [DISCONTINUED] zolpidem (AMBIEN) 10 MG tablet Take 10 mg by mouth at bedtime.   No facility-administered encounter medications on file as of 04/26/2021.    ALLERGIES: Allergies  Allergen Reactions   Duloxetine Hcl Swelling   Fluocinolone Swelling   Acetaminophen Itching and Swelling    Pt states "some swelling"   Amlodipine Besylate Swelling    Swelling of feet, fluid retention   Ciprofloxacin Nausea And Vomiting and Swelling    Swelling of face, jaw, and lips   Hydrocodone-Acetaminophen Itching   Metformin Other (See Comments)    REACTION: GI UPSET   Nsaids Other (See Comments)    "HAS BLEEDING ULCERS"   Quinolones Swelling   Sulfamethoxazole Swelling    Swelling of feet, legs   Valsartan Swelling   Cozaar [Losartan] Swelling and Rash   Lisinopril Swelling and Rash    Oral swelling, and red streaks on arms/stomach   Tape Itching and Rash    Paper tape can only be used on this patient    VACCINATION STATUS: Immunization History  Administered Date(s) Administered   Influenza,inj,Quad PF,6+ Mos 12/26/2016   Pneumococcal Polysaccharide-23 12/26/2016    Diabetes She presents for her follow-up diabetic visit. She has type 2 diabetes mellitus. Onset time: diagnosed at approx age of 80. Her disease course has been improving. There are no hypoglycemic associated symptoms. Pertinent negatives for  hypoglycemia include no headaches, sweats or tremors. Associated symptoms include fatigue, polydipsia and polyuria. Pertinent negatives for diabetes include no blurred vision, no weakness and no weight loss. There are no hypoglycemic complications. Symptoms are improving. Diabetic complications include heart disease, nephropathy and peripheral neuropathy. (Has been hospitalized multiple times for high and low glucose)  Risk factors for coronary artery disease include diabetes mellitus, dyslipidemia, family history, hypertension, post-menopausal, sedentary lifestyle and tobacco exposure. Current diabetic treatment includes intensive insulin program. She is compliant with treatment most of the time. Her weight is increasing steadily. She is following a generally unhealthy diet. When asked about meal planning, she reported none. She has had a previous visit with a dietitian (has seen one in the past). She rarely participates in exercise. Her home blood glucose trend is fluctuating dramatically. (She presents today with her meter and logs, showing improved, yet still above target and fluctuating glycemic profile.  Her POCT A1c today is 12.3%, increasing since last visit of 11%.  Since her last visit, she has stopped taking all oral and injection steroids and is starting to see some improvement in her glucose.  She did go to the hospital as recommended after last visit and was admitted for hypertensive emergency as well as hyperglycemia without DKA.  She does not have any hypoglycemia documented or reported.) An ACE inhibitor/angiotensin II receptor blocker is contraindicated (reported allergy to ACE and ARBs). She does not see a podiatrist.Eye exam is current.  Hypertension This is a chronic problem. The current episode started more than 1 year ago. The problem has been gradually improving since onset. The problem is uncontrolled. Associated symptoms include malaise/fatigue. Pertinent negatives include no blurred vision, headaches, shortness of breath or sweats. There are no associated agents to hypertension. Risk factors for coronary artery disease include diabetes mellitus, dyslipidemia, family history, post-menopausal state and sedentary lifestyle. Past treatments include diuretics, beta blockers and alpha 1 blockers. The current treatment provides mild improvement. Compliance problems include diet and exercise.  Hypertensive  end-organ damage includes kidney disease and CAD/MI. Identifiable causes of hypertension include chronic renal disease.  Hyperlipidemia This is a chronic problem. The problem is uncontrolled. Recent lipid tests were reviewed and are high. Exacerbating diseases include chronic renal disease and diabetes. Factors aggravating her hyperlipidemia include beta blockers and fatty foods. Pertinent negatives include no shortness of breath. Current antihyperlipidemic treatment includes statins and fibric acid derivatives. The current treatment provides mild improvement of lipids. Compliance problems include adherence to diet and adherence to exercise.  Risk factors for coronary artery disease include diabetes mellitus, dyslipidemia, family history, hypertension, post-menopausal and a sedentary lifestyle.    Review of systems  Constitutional: + steadily increasing body weight,  current Body mass index is 26.52 kg/m. , + fatigue-improving, no subjective hyperthermia, no subjective hypothermia Eyes: no blurry vision, no xerophthalmia ENT: no sore throat, no nodules palpated in throat, no dysphagia/odynophagia, no hoarseness Cardiovascular: no chest pain, no shortness of breath, no palpitations, no leg swelling Respiratory: no cough, no shortness of breath Gastrointestinal: no nausea/vomiting/diarrhea Musculoskeletal: no muscle/joint aches Skin: no rashes, no hyperemia Neurological: no tremors, no numbness, no tingling, no dizziness Psychiatric: no depression, no anxiety  Objective:     BP (!) 192/93   Pulse 89   Ht 5' 2" (1.575 m)   Wt 145 lb (65.8 kg)   LMP  (LMP Unknown)   BMI 26.52 kg/m   Wt Readings from Last 3 Encounters:  04/26/21 145 lb (65.8 kg)  04/20/21 138 lb  0.1 oz (62.6 kg)  03/28/21 138 lb (62.6 kg)     BP Readings from Last 3 Encounters:  04/26/21 (!) 192/93  04/20/21 (!) 198/91  03/28/21 (!) 213/66      Physical Exam- Limited  Constitutional:  Body mass index is 26.52  kg/m. , not in acute distress, normal state of mind Eyes:  EOMI, no exophthalmos Neck: Supple Cardiovascular: RRR, no murmurs, rubs, or gallops, no edema Respiratory: Adequate breathing efforts, no crackles, rales, rhonchi, or wheezing Musculoskeletal: no gross deformities, strength intact in all four extremities, no gross restriction of joint movements Skin:  no rashes, no hyperemia Neurological: no tremor with outstretched hands    CMP ( most recent) CMP     Component Value Date/Time   NA 136 04/20/2021 1329   NA 143 06/17/2017 1220   K 4.3 04/20/2021 1329   CL 102 04/20/2021 1329   CO2 20 (L) 04/20/2021 1329   GLUCOSE 62 (L) 04/20/2021 1329   BUN 56 (H) 04/20/2021 1329   BUN 34 (H) 06/17/2017 1220   CREATININE 2.11 (H) 04/20/2021 1329   CALCIUM 10.0 04/20/2021 1329   CALCIUM 9.9 04/20/2021 1329   PROT 6.1 (L) 01/11/2021 0523   PROT 7.0 06/17/2017 1220   ALBUMIN 4.0 04/20/2021 1329   ALBUMIN 4.4 06/17/2017 1220   AST 23 01/11/2021 0523   ALT 16 01/11/2021 0523   ALKPHOS 60 01/11/2021 0523   BILITOT 0.2 (L) 01/11/2021 0523   BILITOT 0.3 06/17/2017 1220   GFRNONAA 25 (L) 04/20/2021 1329   GFRAA 56 (L) 05/19/2020 0640     Diabetic Labs (most recent): Lab Results  Component Value Date   HGBA1C 12.3 (A) 04/26/2021   HGBA1C 11.0 (H) 01/06/2021   HGBA1C 6.9 (H) 05/17/2020     Lipid Panel ( most recent) Lipid Panel     Component Value Date/Time   CHOL 405 (H) 08/22/2020 1426   TRIG 597 (H) 01/08/2021 1100   HDL 30 (L) 08/22/2020 1426   CHOLHDL 10.7 05/17/2020 0445   VLDL UNABLE TO CALCULATE IF TRIGLYCERIDE OVER 400 mg/dL 05/17/2020 0445   LDLCALC Comment (A) 08/22/2020 1426   LDLDIRECT 129 (H) 08/22/2020 1426   LDLDIRECT 96.9 05/17/2020 0445   LABVLDL Comment (A) 08/22/2020 1426      Lab Results  Component Value Date   TSH 2.19 03/10/2020   TSH 0.78 08/06/2018   TSH 1.489 12/25/2016           Assessment & Plan:   1) Uncontrolled Type 2 Diabetes  with nephropathy  She presents today with her meter and logs, showing improved, yet still above target and fluctuating glycemic profile.  Her POCT A1c today is 12.3%, increasing since last visit of 11%.  Since her last visit, she has stopped taking all oral and injection steroids and is starting to see some improvement in her glucose.  She did go to the hospital as recommended after last visit and was admitted for hypertensive emergency as well as hyperglycemia without DKA.  She does not have any hypoglycemia documented or reported.  - NAVEA WOODROW has currently uncontrolled symptomatic type 2 DM since 70 years of age.  -Recent labs reviewed.  - I had a long discussion with her about the progressive nature of diabetes and the pathology behind its complications. -her diabetes is complicated by CKD, CAD, neuropathy, and pancreatitis and she remains at a high risk for more acute and chronic complications which include CAD, CVA, CKD, retinopathy, and neuropathy. These are  all discussed in detail with her.  - Nutritional counseling repeated at each appointment due to patients tendency to fall back in to old habits.  - The patient admits there is a room for improvement in their diet and drink choices. -  Suggestion is made for the patient to avoid simple carbohydrates from their diet including Cakes, Sweet Desserts / Pastries, Ice Cream, Soda (diet and regular), Sweet Tea, Candies, Chips, Cookies, Sweet Pastries, Store Bought Juices, Alcohol in Excess of 1-2 drinks a day, Artificial Sweeteners, Coffee Creamer, and "Sugar-free" Products. This will help patient to have stable blood glucose profile and potentially avoid unintended weight gain.   - I encouraged the patient to switch to unprocessed or minimally processed complex starch and increased protein intake (animal or plant source), fruits, and vegetables.   - Patient is advised to stick to a routine mealtimes to eat 3 meals a day and avoid  unnecessary snacks (to snack only to correct hypoglycemia).  - I have approached her with the following individualized plan to manage  her diabetes and patient agrees:   -Based on her continued hyperglycemia, she is advised to increase her Levemir to 90 units SQ nightly and Humalog to 22-28 units TID with meals if glucose is above 90 and she is eating (specific instructions on how to titrate insulin dose based on glucose readings given to patient in writing).   -she is encouraged to continue monitoring glucose 4 times daily, before meals and before bed, and to call the clinic if she has readings less than 70 or greater than 300 for 3 tests in a row.  She is an excellent candidate for CGM device.  Placed order for Dexcom with [facility].  - she is warned not to take insulin without proper monitoring per orders. - Adjustment parameters are given to her for hypo and hyperglycemia in writing.  -She reportedly does not tolerate Metformin due to GI side effects.  - Specific targets for  A1c;  LDL, HDL,  and Triglycerides were discussed with the patient.  2) Blood Pressure /Hypertension:  her blood pressure is still not controlled to target.  she is advised to continue her current medications including Lasix 40 mg po twice daily, Labetalol 300 mg p.o. twice daily, and Clonidine 0.2 mg po BID.   3) Lipids/Hyperlipidemia:    Review of her recent lipid panel from 01/08/21 showed uncontrolled triglycerides of 597, LDL unable to be calculated .  she  is advised to continue Lipitor 80 mg po daily at bedtime and Fenofibrate 145 mg daily.  Side effects and precautions discussed with her.  4)  Weight/Diet:  her Body mass index is 26.52 kg/m.  -   she is not a candidate for weight loss.  Exercise, and detailed carbohydrates information provided  -  detailed on discharge instructions.  5) Chronic Care/Health Maintenance: -she is on Statin medications and is encouraged to initiate and continue to follow up with  Ophthalmology, Dentist, Podiatrist at least yearly or according to recommendations, and advised to stay away from smoking. I have recommended yearly flu vaccine and pneumonia vaccine at least every 5 years; moderate intensity exercise for up to 150 minutes weekly; and sleep for at least 7 hours a day.  - she is advised to maintain close follow up with [name], MD for primary care needs, as well as her other providers for optimal and coordinated care.         I spent 41 minutes in the care of  the patient today including review of labs from [facility], Lipids, Thyroid Function, Hematology (current and previous including abstractions from other facilities); face-to-face time discussing  her blood glucose readings/logs, discussing hypoglycemia and hyperglycemia episodes and symptoms, medications doses, her options of short and long term treatment based on the latest standards of care / guidelines;  discussion about incorporating lifestyle medicine;  and documenting the encounter.    Please refer to Patient Instructions for Blood Glucose Monitoring and Insulin/Medications Dosing Guide"  in media tab for additional information. Please  also refer to " Patient Self Inventory" in the Media  tab for reviewed elements of pertinent patient history.  [name] participated in the discussions, expressed understanding, and voiced agreement with the above plans.  All questions were answered to her satisfaction. she is encouraged to contact clinic should she have any questions or concerns prior to her return visit.   Follow up plan: - Return in about 3 months (around 07/27/2021) for Diabetes F/U with A1c in office, No previsit labs, Bring meter and logs.  Rayetta Pigg, [facility] [address] [place], [place] [address] Phone: [phone] Fax: [phone]  04/26/2021, 4:22 PM

## 2021-04-26 NOTE — Patient Instructions (Signed)
Hyperglycemia ?Hyperglycemia is when the sugar (glucose) level in your blood is too high. High blood sugar can happen to people who have or do not have diabetes. High blood sugar can happen quickly. It can be an emergency. ?What are the causes? ?If you have diabetes, high blood sugar may be caused by: ?Medicines that increase blood sugar or affect your control of diabetes. ?Getting less physical activity. ?Overeating. ?Being sick or injured or having an infection. ?Having surgery. ?Stress. ?Not giving yourself enough insulin (if you are taking it). ?You may have high blood sugar because you have diabetes that has not been diagnosed yet. ?If you do not have diabetes, high blood sugar may be caused by: ?Certain medicines. ?Stress. ?A bad illness. ?An infection. ?Having surgery. ?Diseases of the pancreas. ?What increases the risk? ?This condition is more likely to develop in people who have risk factors for diabetes, such as: ?Having a family member with diabetes. ?Certain conditions in which the body's defense system (immune system) attacks itself. These are called autoimmune disorders. ?Being overweight. ?Not being active. ?Having a condition called insulin resistance. ?Having a history of: ?Prediabetes. ?Diabetes when pregnant. ?Polycystic ovarian syndrome (PCOS). ?What are the signs or symptoms? ?This condition may not cause symptoms. If you do have symptoms, they may include: ?Feeling more thirsty than normal. ?Needing to pee (urinate) more often than normal. ?Hunger. ?Feeling very tired. ?Blurry eyesight (vision). ?You may get other symptoms as the condition gets worse, such as: ?Dry mouth. ?Pain in your belly (abdomen). ?Not being hungry (loss of appetite). ?Breath that smells fruity. ?Weakness. ?Weight loss that is not planned. ?A tingling or numb feeling in your hands or feet. ?A headache. ?Cuts or bruises that heal slowly. ?How is this treated? ?Treatment depends on the cause of your condition. Treatment may  include: ?Taking medicine to control your blood sugar levels. ?Changing your medicine or dosage if you take insulin or other diabetes medicines. ?Lifestyle changes. These may include: ?Exercising more. ?Eating healthier foods. ?Losing weight. ?Treating an illness or infection. ?Checking your blood sugar more often. ?Stopping or reducing steroid medicines. ?If your condition gets very bad, you will need to be treated in the hospital. ?Follow these instructions at home: ?General instructions ?Take over-the-counter and prescription medicines only as told by your doctor. ?Do not smoke or use any products that contain nicotine or tobacco. If you need help quitting, ask your doctor. ?If you drink alcohol: ?Limit how much you have to: ?0-1 drink a day for women who are not pregnant. ?0-2 drinks a day for men. ?Know how much alcohol is in a drink. In the [place] one drink equals one 12 oz bottle of beer (355 mL), one 5 oz glass of wine (148 mL), or one 1? oz glass of hard liquor (44 mL). ?Manage stress. If you need help with this, ask your doctor. ?Do exercises as told by your doctor. ?Keep all follow-up visits. ?Eating and drinking ? ?Stay at a healthy weight. ?Make sure you drink enough fluid when you: ?Exercise. ?Get sick. ?Are in hot temperatures. ?Drink enough fluid to keep your pee (urine) pale yellow. ?If you have diabetes: ? ?Know the symptoms of high blood sugar. ?Follow your diabetes management plan as told by your doctor. Make sure you: ?Take insulin and medicines as told. ?Follow your exercise plan. ?Follow your meal plan. Eat on time. Do not skip meals. ?Check your blood sugar as often as told. Make sure you check before and after exercise. If you   exercise longer or in a different way, check your blood sugar more often. ?Follow your sick day plan whenever you cannot eat or drink normally. Make this plan ahead of time with your doctor. ?Share your diabetes management plan with people in your workplace, school,  and household. ?Check your pee for ketones when you are ill and as told by your doctor. ?Carry a card or wear jewelry that says that you have diabetes. ?Where to find more information ?American Diabetes Association: www.diabetes.org ?Contact a doctor if: ?Your blood sugar level is at or above 240 mg/dL (13.3 mmol/L) for 2 days in a row. ?You have problems keeping your blood sugar in your target range. ?You have high blood pressure often. ?You have signs of illness, such as: ?Feeling like you may vomit (feeling nauseous). ?Vomiting. ?A fever. ?Get help right away if: ?Your blood sugar monitor reads "high" even when you are taking insulin. ?You have trouble breathing. ?You have a change in how you think, feel, or act (mental status). ?You feel like you may vomit, and the feeling does not go away. ?You cannot stop vomiting. ?These symptoms may be an emergency. Get medical help right away. Call your local emergency services (911 in the [place] ?Do not wait to see if the symptoms will go away. ?Do not drive yourself to the hospital. ?Summary ?Hyperglycemia is when the sugar (glucose) level in your blood is too high. ?High blood sugar can happen to people who have or do not have diabetes. ?Make sure you drink enough fluids and follow your meal plan. Exercise as often as told by your doctor. ?Contact your doctor if you have problems keeping your blood sugar in your target range. ?This information is not intended to replace advice given to you by your health care provider. Make sure you discuss any questions you have with your health care provider. ?Document Revised: 07/29/2020 Document Reviewed: 07/29/2020 ?Elsevier Patient Education ? 2022 Elsevier Inc. ? ?

## 2021-04-27 ENCOUNTER — Ambulatory Visit: Payer: 59 | Admitting: Internal Medicine

## 2021-04-28 ENCOUNTER — Encounter: Payer: Self-pay | Admitting: Cardiology

## 2021-04-28 ENCOUNTER — Other Ambulatory Visit: Payer: Self-pay

## 2021-04-28 ENCOUNTER — Ambulatory Visit (INDEPENDENT_AMBULATORY_CARE_PROVIDER_SITE_OTHER): Payer: 59 | Admitting: Cardiology

## 2021-04-28 VITALS — BP 124/58 | HR 88 | Ht 63.0 in | Wt 148.8 lb

## 2021-04-28 DIAGNOSIS — R002 Palpitations: Secondary | ICD-10-CM | POA: Diagnosis not present

## 2021-04-28 DIAGNOSIS — R0789 Other chest pain: Secondary | ICD-10-CM

## 2021-04-28 DIAGNOSIS — I10 Essential (primary) hypertension: Secondary | ICD-10-CM | POA: Diagnosis not present

## 2021-04-28 DIAGNOSIS — E782 Mixed hyperlipidemia: Secondary | ICD-10-CM

## 2021-04-28 NOTE — Addendum Note (Signed)
Addended by: Christella Scheuermann C on: 04/28/2021 11:22 AM   Modules accepted: Orders

## 2021-04-28 NOTE — Progress Notes (Signed)
Clinical Summary Heather [name] is a 70 y.o.femaleseen today for follow up of the following medical problems.      1. Chronic chest pain - normal cath 2003, 2005, 2012   Negative nuclear stress test in 2010 and 05/2013 - 11/2016 echo normal LVEF, no WMAs -no recent symptoms   2. HTN -compliant with meds  - ARB/ACE allergy, norvasc allergy - has been on hydralazine and labetaolol  -at renal ppt 04/25/21 labetalol was increased to 425m tid however has not started taking - hydral stopped afer 12/2020 admission with issues with hypotension in setting of diarrhea and pancreatitis  - home bp's 2-3 times per day, typically 120s/60s - compliant with meds    3. Palpitations 07/2017 event monitor no arrhythmias - palpitations at times that is stable, mild     4. Hyperlipidemia - on cholystermine for diarrhea by her GI doctor, not for cholesterol - labs followd by pcp   04/2020 TC 320 TG 629 LDL cannto calculate.  - compliant with meds   4. DM2 - per pcp - allergies to ACE-I/ARB   5. CKD  - followed by Dr BTheador Hawthorne    6. IBS/Chronic N/V - followed by GI   [facility]. [facility] notes mention a nagetive cardiac monitor     8. Cervical radiculopathy - followed by ortho   9. COVID + - admitted 62/06/4764 complicated by DKA   Past Medical History:  Diagnosis Date   Acute renal failure superimposed on stage 3 chronic kidney disease [place]   Anemia    Anxiety    B12 deficiency    [place]ign[place] [place]   Cellulitis and abscess    Abdomen and buttocks   CKD (chronic kidney disease) stage 3, GFR 30-59 ml/min (HCC)    Closed fracture of distal end of femur, unspecified fracture morphology, initial encounter ([facility]    Depression    Diabetic neuropathy (HCC)    feet   DJD (degenerative joint disease)    Right forminal stenosis C4-5   Essential hypertension    Femur fracture, right ([facility]/[facility]/5034  Folliculitis     GERD (gastroesophageal reflux disease)    Gout    Headache(784.0)    Hiatal hernia    History of blood transfusion    History of bronchitis    History of cardiac catheterization    No significant CAD 2012   History of kidney stones    surgery to remove   Insomnia    Irritable bowel syndrome    Mixed hyperlipidemia    Osteoarthritis    Palpitations    Pneumonia    Reflux esophagitis    Salmonella    Stroke (HCC)    Tinnitus    Right   Type 2 diabetes mellitus (HCC)      Allergies  Allergen Reactions   Duloxetine Hcl Swelling   Fluocinolone Swelling   Acetaminophen Itching and Swelling    Pt states "some swelling"   Amlodipine Besylate Swelling    Swelling of feet, fluid retention   Ciprofloxacin Nausea And Vomiting and Swelling    Swelling of face, jaw, and lips   Hydrocodone-Acetaminophen Itching   Metformin Other (See Comments)    REACTION: GI UPSET   Nsaids Other (See Comments)    "HAS BLEEDING ULCERS"   Quinolones Swelling   Sulfamethoxazole Swelling    Swelling of feet, legs   Valsartan Swelling   Cozaar [Losartan] Swelling and  Rash   Lisinopril Swelling and Rash    Oral swelling, and red streaks on arms/stomach   Tape Itching and Rash    Paper tape can only be used on this patient     Current Outpatient Medications  Medication Sig Dispense Refill   acetaminophen (TYLENOL) 650 MG CR tablet Take 650 mg by mouth every 8 (eight) hours as needed for pain.     Adalimumab (HUMIRA PEN) 40 MG/0.4ML PNKT Inject 40 mg into the skin every 14 (fourteen) days. After the completion of the starter kit     Ascorbic Acid (VITAMIN C WITH ROSE HIPS) 500 MG tablet Take 500 mg by mouth every morning.     atorvastatin (LIPITOR) 80 MG tablet Take 80 mg by mouth every morning.     butalbital-acetaminophen-caffeine (FIORICET) 50-325-40 MG tablet Take 1 tablet by mouth every 6 (six) hours as needed for migraine. 20 tablet 0   Cholecalciferol (VITAMIN D) 2000 units CAPS Take 2,000  Units by mouth every morning.      citalopram (CELEXA) 20 MG tablet Take 1 tablet (20 mg total) by mouth daily. 30 tablet 1   cloNIDine (CATAPRES) 0.2 MG tablet Take 0.5 tablets (0.1 mg total) by mouth 2 (two) times daily. 60 tablet 3   cyanocobalamin (,VITAMIN B-12,) 1000 MCG/ML injection Inject 1,000 mcg into the muscle every 30 (thirty) days.       dicyclomine (BENTYL) 20 MG tablet Take 1 tablet (20 mg total) by mouth 3 (three) times daily before meals. 90 tablet 1   diphenoxylate-atropine (LOMOTIL) 2.5-0.025 MG tablet 1-2 tablets qid prn diarrhea Instead of Imodium 90 tablet 1   epoetin alfa-epbx (RETACRIT) 3000 UNIT/ML injection Inject 3,000 Units into the skin every 14 (fourteen) days.     esomeprazole (NEXIUM) 40 MG capsule Take 40 mg by mouth daily before breakfast.       fenofibrate (TRICOR) 145 MG tablet Take 1 tablet (145 mg total) by mouth daily. 90 tablet 1   furosemide (LASIX) 40 MG tablet Take 1 tablet (40 mg total) by mouth daily. (Patient taking differently: Take 40 mg by mouth 2 (two) times daily.) 30 tablet 0   gabapentin (NEURONTIN) 300 MG capsule Take 300 mg by mouth 3 (three) times daily.     hydrocortisone 2.5 % cream Apply topically 2 (two) times daily. 30 g 0   insulin detemir (LEVEMIR FLEXTOUCH) 100 UNIT/ML FlexPen Inject 90 Units into the skin at bedtime. 30 mL 3   Insulin Lispro (HUMALOG KWIKPEN [place]) Inject 22-28 Units into the skin 3 (three) times daily with meals. Sliding scale     labetalol (NORMODYNE) 300 MG tablet Take 1 tablet (300 mg total) by mouth 2 (two) times daily. 180 tablet 3   levofloxacin (LEVAQUIN) 500 MG tablet Take 500 mg by mouth daily.     lipase/protease/amylase (CREON) 36000 UNITS CPEP capsule Take 1 capsule (36,000 Units total) by mouth with snacks. 270 capsule 1   meclizine (ANTIVERT) 25 MG tablet Take 1 tablet by mouth as needed.     nystatin (MYCOSTATIN) 100000 UNIT/ML suspension Take 20 mLs by mouth as needed.     omega-3 acid ethyl esters  (LOVAZA) 1 g capsule Take 2 capsules by mouth 2 (two) times daily.     ondansetron (ZOFRAN) 4 MG tablet Take 4 mg by mouth every 8 (eight) hours as needed for nausea.     potassium chloride SA (K-DUR,KLOR-CON) 20 MEQ tablet Take 20 mEq by mouth every morning.  promethazine (PHENERGAN) 25 MG tablet Take 1 tablet (25 mg total) by mouth every 8 (eight) hours as needed for refractory nausea / vomiting. 20 tablet 0   tiZANidine (ZANAFLEX) 4 MG tablet Take 8 mg by mouth at bedtime.     venlafaxine XR (EFFEXOR-XR) 75 MG 24 hr capsule Take 75 mg by mouth daily.     No current facility-administered medications for this visit.     Past Surgical History:  Procedure Laterality Date   BACK SURGERY     c-spine surgery     03/2007   CARDIAC CATHETERIZATION     CHOLECYSTECTOMY     COLONOSCOPY  04/21/2011; 05/29/11   6/12: [place] - ?AVM at IC valve, inflammatory changes at IC valve [facility]); 7/12 - [place]; IC valve erosions, look chronic and probably Crohn's per path   colonoscopy  2017   [place]: random biopsies normal. TI normal   ESOPHAGOGASTRODUODENOSCOPY  05/29/11   normal   ESOPHAGOGASTRODUODENOSCOPY N/A 01/21/2016   Dr. Michail Sermon: normal   EYE SURGERY Bilateral    cataracts removed   FEMUR IM NAIL Right 04/10/2018   Procedure: INTRAMEDULLARY (IM) NAIL FEMORAL;  Surgeon: Paralee Cancel, MD;  Location: [facility];  Service: Orthopedics;  Laterality: Right;   FRACTURE SURGERY     GIVENS CAPSULE STUDY  11/17/2020   HARDWARE REMOVAL Right 04/10/2018   Procedure: HARDWARE REMOVAL RIGHT DISTAL FEMUR;  Surgeon: Paralee Cancel, MD;  Location: [facility];  Service: Orthopedics;  Laterality: Right;   HERNIA REPAIR     IR FLUORO GUIDE CV LINE RIGHT  04/01/2017   IR US GUIDE VASC ACCESS RIGHT  04/01/2017   JOINT REPLACEMENT Right    hip   OPEN REDUCTION INTERNAL FIXATION (ORIF) DISTAL RADIAL FRACTURE Right 05/28/2019   Procedure: OPEN REDUCTION INTERNAL FIXATION (ORIF) DISTAL RADIAL FRACTURE;   Surgeon: Verner Mould, MD;  Location: [place];  Service: Orthopedics;  Laterality: Right;  with block 90 minutes   ORIF FEMUR FRACTURE Right 03/31/2017   Procedure: OPEN REDUCTION INTERNAL FIXATION (ORIF) DISTAL FEMUR FRACTURE;  Surgeon: Paralee Cancel, MD;  Location: [place];  Service: Orthopedics;  Laterality: Right;   removal of kidney stone     Right knee arthroscopic surgery     TONSILLECTOMY     TOTAL ABDOMINAL HYSTERECTOMY       Allergies  Allergen Reactions   Duloxetine Hcl Swelling   Fluocinolone Swelling   Acetaminophen Itching and Swelling    Pt states "some swelling"   Amlodipine Besylate Swelling    Swelling of feet, fluid retention   Ciprofloxacin Nausea And Vomiting and Swelling    Swelling of face, jaw, and lips   Hydrocodone-Acetaminophen Itching   Metformin Other (See Comments)    REACTION: GI UPSET   Nsaids Other (See Comments)    "HAS BLEEDING ULCERS"   Quinolones Swelling   Sulfamethoxazole Swelling    Swelling of feet, legs   Valsartan Swelling   Cozaar [Losartan] Swelling and Rash   Lisinopril Swelling and Rash    Oral swelling, and red streaks on arms/stomach   Tape Itching and Rash    Paper tape can only be used on this patient      Family History  Problem Relation Age of Onset   Diabetes Mother    Colon cancer Brother        POSSIBLY. Patient states diagnosed at age 68, then later states age 59. unclear and limited historian   Esophageal cancer Neg Hx    Rectal  cancer Neg Hx    Stomach cancer Neg Hx      Social History Ms. Retana reports that she has never smoked. She has never used smokeless tobacco. Ms. Sutter reports no history of alcohol use.   Review of Systems CONSTITUTIONAL: No weight loss, fever, chills, weakness or fatigue.  HEENT: Eyes: No visual loss, blurred vision, double vision or yellow sclerae.No hearing loss, sneezing, congestion, runny nose or sore throat.  SKIN: No rash or itching.  CARDIOVASCULAR: per  hpi RESPIRATORY: No shortness of breath, cough or sputum.  GASTROINTESTINAL: No anorexia, nausea, vomiting or diarrhea. No abdominal pain or blood.  GENITOURINARY: No burning on urination, no polyuria NEUROLOGICAL: No headache, dizziness, syncope, paralysis, ataxia, numbness or tingling in the extremities. No change in bowel or bladder control.  MUSCULOSKELETAL: No muscle, back pain, joint pain or stiffness.  LYMPHATICS: No enlarged nodes. No history of splenectomy.  PSYCHIATRIC: No history of depression or anxiety.  ENDOCRINOLOGIC: No reports of sweating, cold or heat intolerance. No polyuria or polydipsia.  Marland Kitchen   Physical Examination Today's Vitals   04/28/21 1053  BP: (!) 124/58  Pulse: 88  SpO2: 100%  Weight: 148 lb 12.8 oz (67.5 kg)  Height: '5\' 3"'  (1.6 m)   Body mass index is 26.36 kg/m.;  Gen: resting comfortably, no acute distress HEENT: no scleral icterus, pupils equal round and reactive, no palptable cervical adenopathy,  CV: RRR, no m/r/g no jvd Resp: Clear to auscultation bilaterally GI: abdomen is soft, non-tender, non-distended, normal bowel sounds, no hepatosplenomegaly MSK: extremities are warm, no edema.  Skin: warm, no rash Neuro:  no focal deficits Psych: appropriate affect   Diagnostic Studies Cath 2005: normal coronaries   05/2013 nuclear stress: no ischemia   04/2017 echo Study Conclusions  - Left ventricle: The cavity size was normal. There was mild   concentric hypertrophy. Systolic function was normal. The   estimated ejection fraction was in the range of 55% to   60%. Wall motion was normal; there were no regional wall   motion abnormalities. Doppler parameters are consistent   with abnormal left ventricular relaxation (grade 1   diastolic dysfunction). Borderline evidence of increased   filling pressures. - Aortic valve: Trileaflet; mildly thickened leaflets. No   significant regurgitation. Mean gradient: 66m Hg (S).   Valve area: 2.38cm^2  (Vmax). - Mitral valve: Mild regurgitation. Valve area by continuity   equation (using LVOT flow): 4.83cm^2. - Left atrium: The atrium was mildly dilated. - Right atrium: Central venous pressure: 351mHg (est). - Tricuspid valve: Mild regurgitation. - Pulmonary arteries: PA peak pressure: 3262mg (S). - Pericardium, extracardiac: There was no pericardial   effusion. Impressions:  - Unable to compare with previous study from December 2012.   There is mild LVH with LVEF 55-97-35%rade 1 diastolic   dysfunction and borderline evidence of increased filling   pressures. Mild left atrial enlargement. Mild mitral   regurgitation. Mild tricuspid regurgitation with PASP 32   mm mercury and normal CVP. Transthoracic echocardiography.  M-mode, complete 2D, spectral Doppler, and color Doppler.  Height:  Height: 157.5cm. Height: 62in.  Weight:  Weight: 77.1kg. Weight: 169.6lb.  Body mass index:  BMI: 31.1kg/m^2.  Body surface area:    BSA: 1.76m64m Blood pressure:     150/85.  Patient status:  Outpatient.     11/2016 echo Study Conclusions   - Left ventricle: The cavity size was normal. Wall thickness was   increased in a pattern of mild LVH.  Systolic function was   vigorous. The estimated ejection fraction was in the range of 65%   to 70%. Wall motion was normal; there were no regional wall   motion abnormalities. The study is not technically sufficient to   allow evaluation of LV diastolic function. - Aortic valve: Mildly calcified annulus. Trileaflet; mildly   calcified leaflets. - Mitral valve: Calcified annulus. There was trivial regurgitation. - Right atrium: Central venous pressure (est): 3 mm Hg. - Atrial septum: No defect or patent foramen ovale was identified. - Tricuspid valve: There was trivial regurgitation. - Pulmonary arteries: Systolic pressure could not be accurately   estimated. - Pericardium, extracardiac: There was no pericardial effusion.   Impressions:   - Mild  LVH with LVEF 65-70%, no regional wall motion abnormalities.   Indeterminate diastolic function. Mildly calcified mitral annulus   with trivial mitral regurgitation. Mildly calcified aortic valve.   Trivial tricuspid regurgitation.   11/2010 cath HEMODYNAMIC DATA: 1. Right atrial pressure 9-11. 2. RV 32/9-12. 3. Pulmonary artery 32/18, mean 24. 4. Pulmonary capillary wedge 17. 5. Aortic 111/60, mean 81. 6. LV 102/17. 7. No gradient pullback across aortic valve. 8. No mitral valve gradient and simultaneous pressure measurements. 9. Superior vena cava saturation 52%. 10.Pulmonary artery saturation 52%. 11.Aortic saturation 92%. 12.Fick cardiac output 4.7 liters per minute. 13.Fick cardiac index 2.6 liters per minute per meter squared. 14.Thermodilution cardiac output 3.9 liters per minute. 15.Thermodilution cardiac index 2.1 liters per minute per meter     squared.   ANGIOGRAPHIC DATA: 1. The left main coronary artery is free of critical disease. 2. The LAD courses towards the apex.  There are twin vessels down over     the anteroseptal, anterolateral segment.  One is the diagonal, the     other is likely the LAD with multiple septal perforators.  This     vessel was smooth throughout without significant focal narrowing. 3. The circumflex provides a large marginal Jonah Nestle and a smaller AV     circumflex, both of which appear free of critical disease. 4. The right coronary artery is moderately large vessel providing     posterior descending and posterolateral Semya Klinke.  There may be some     minor plaquing at the bifurcation of the PLA, PDA, but no     significant areas of high-grade focal stenosis noted.   CONCLUSION: 1. Borderline elevation of right heart pressures without significant     pulmonary hypertension. 2. No evidence of either aortic or mitral valve gradient. 3. Somewhat low saturations in the superior vena cava and pulmonary     artery rechecked during the  procedure. 4. No significant coronary obstruction.     07/2017 event monitor Telemetry tracings show normal sinus rhythm and sinus tachycardia No signficant arrhythmias. No symptoms reported    Assessment and Plan  1. Chest pain - long history, negative ischemic workups in the past -no recent symptmos, continue currentmeds     2. HTN - at goal today, home numbers are at goal. High bp's at times with other provider visits - continue current meds, asked to continue to [facility] rbp's at home and always bring with her to all her clinic visits   3. Palpitations - previous benign monitor - chronic mild symptoms, continue beta blocker   4. Hyperlipidemia -repeat lipid panel, continue statin and fenofibrate      Arnoldo Lenis, M.D.

## 2021-04-28 NOTE — Patient Instructions (Signed)
Medication Instructions:  Your physician recommends that you continue on your current medications as directed. Please refer to the Current Medication list given to you today.  *If you need a refill on your cardiac medications before your next appointment, please call your pharmacy*   Lab Work: FASTING Lipid Panel Direct LDL If you have labs (blood work) drawn today and your tests are completely normal, you will receive your results only by: [place] (if you have MyChart) OR A paper copy in the mail If you have any lab test that is abnormal or we need to change your treatment, we will call you to review the results.   Testing/Procedures: None   Follow-Up: At [facility], you and your health needs are our priority.  As part of our continuing mission to provide you with exceptional heart care, we have created designated Provider Care Teams.  These Care Teams include your primary Cardiologist (physician) and Advanced Practice Providers (APPs -  Physician Assistants and Nurse Practitioners) who all work together to provide you with the care you need, when you need it.  We recommend signing up for the patient portal called "MyChart".  Sign up information is provided on this After Visit Summary.  MyChart is used to connect with patients for Virtual Visits (Telemedicine).  Patients are able to view lab/test results, encounter notes, upcoming appointments, etc.  Non-urgent messages can be sent to your provider as well.   To learn more about what you can do with MyChart, go to [email or web address].    Your next appointment:   6 month(s)  The format for your next appointment:   In Person  Provider:   Carlyle Dolly, MD   Other Instructions

## 2021-05-04 ENCOUNTER — Encounter (HOSPITAL_COMMUNITY)
Admission: RE | Admit: 2021-05-04 | Discharge: 2021-05-04 | Disposition: A | Discharge status: Home / Self Care | Payer: 59 | Source: Ambulatory Visit | Attending: Nephrology | Admitting: Nephrology

## 2021-05-04 ENCOUNTER — Other Ambulatory Visit: Payer: Self-pay

## 2021-05-04 ENCOUNTER — Other Ambulatory Visit (HOSPITAL_COMMUNITY)
Admission: RE | Admit: 2021-05-04 | Discharge: 2021-05-04 | Disposition: A | Discharge status: Home / Self Care | Payer: 59 | Source: Ambulatory Visit | Attending: Cardiology | Admitting: Cardiology

## 2021-05-04 DIAGNOSIS — N184 Chronic kidney disease, stage 4 (severe): Secondary | ICD-10-CM | POA: Diagnosis present

## 2021-05-04 DIAGNOSIS — E782 Mixed hyperlipidemia: Secondary | ICD-10-CM | POA: Insufficient documentation

## 2021-05-04 LAB — LIPID PANEL
Cholesterol: 449 mg/dL — ABNORMAL HIGH (ref 0–200)
HDL: 49 mg/dL (ref >40)
LDL Cholesterol: UNDETERMINED mg/dL (ref 0–99)
Total CHOL/HDL Ratio: 9.2 RATIO
Triglycerides: 617 mg/dL — ABNORMAL HIGH (ref <150)
VLDL: UNDETERMINED mg/dL (ref 0–40)

## 2021-05-04 LAB — LDL CHOLESTEROL, DIRECT: Direct LDL: 117.9 mg/dL — ABNORMAL HIGH (ref 0–99)

## 2021-05-04 LAB — POCT HEMOGLOBIN-HEMACUE
Hemoglobin: 10.7 g/dL — ABNORMAL LOW (ref 12.0–15.0)
Hemoglobin: 8.6 g/dL — ABNORMAL LOW (ref 12.0–15.0)

## 2021-05-04 MED ORDER — EPOETIN ALFA-EPBX 3000 UNIT/ML IJ SOLN: 3000.0000 [IU] | Freq: Once | INTRAMUSCULAR | Status: DC

## 2021-05-15 ENCOUNTER — Telehealth: Payer: Self-pay

## 2021-05-15 NOTE — Telephone Encounter (Signed)
Pt stated she is using dexcom. Discussed with pt the sensor lasts for 10 days and the transmitter lasts for 3 months. Understanding voiced per pt.

## 2021-05-15 NOTE — Telephone Encounter (Signed)
Patient left a VM for a nurse to return her call in regards to her [place]. She wants to know does it last 10 or 30 days?

## 2021-05-17 ENCOUNTER — Telehealth: Payer: Self-pay

## 2021-05-17 NOTE — Telephone Encounter (Signed)
Pt notified and voiced understanding. Pt verified that she was taking Lovazadaily.

## 2021-05-17 NOTE — Telephone Encounter (Signed)
-----   Message from Arnoldo Lenis, MD sent at 05/16/2021  9:38 AM EDT ----- Cholesterol remains high, encourage to taker her lovaza daily, I believe last time we spoke she may not have been taking regularly.  Zandra Abts MD

## 2021-05-18 ENCOUNTER — Encounter (HOSPITAL_COMMUNITY)
Admission: RE | Admit: 2021-05-18 | Discharge: 2021-05-18 | Disposition: A | Discharge status: Home / Self Care | Payer: 59 | Source: Ambulatory Visit | Attending: Nephrology | Admitting: Nephrology

## 2021-05-18 ENCOUNTER — Emergency Department (HOSPITAL_COMMUNITY): Payer: 59

## 2021-05-18 ENCOUNTER — Other Ambulatory Visit: Payer: Self-pay

## 2021-05-18 ENCOUNTER — Emergency Department (HOSPITAL_COMMUNITY)
Admission: EM | Admit: 2021-05-18 | Discharge: 2021-05-18 | Disposition: A | Discharge status: Home / Self Care | Payer: 59 | Attending: Emergency Medicine | Admitting: Emergency Medicine

## 2021-05-18 ENCOUNTER — Encounter (HOSPITAL_COMMUNITY): Payer: Self-pay | Admitting: *Deleted

## 2021-05-18 DIAGNOSIS — I129 Hypertensive chronic kidney disease with stage 1 through stage 4 chronic kidney disease, or unspecified chronic kidney disease: Secondary | ICD-10-CM | POA: Insufficient documentation

## 2021-05-18 DIAGNOSIS — E1122 Type 2 diabetes mellitus with diabetic chronic kidney disease: Secondary | ICD-10-CM | POA: Diagnosis not present

## 2021-05-18 DIAGNOSIS — E1140 Type 2 diabetes mellitus with diabetic neuropathy, unspecified: Secondary | ICD-10-CM | POA: Diagnosis not present

## 2021-05-18 DIAGNOSIS — D631 Anemia in chronic kidney disease: Secondary | ICD-10-CM | POA: Diagnosis not present

## 2021-05-18 DIAGNOSIS — N1832 Chronic kidney disease, stage 3b: Secondary | ICD-10-CM | POA: Insufficient documentation

## 2021-05-18 DIAGNOSIS — N183 Chronic kidney disease, stage 3 unspecified: Secondary | ICD-10-CM | POA: Insufficient documentation

## 2021-05-18 DIAGNOSIS — Z79899 Other long term (current) drug therapy: Secondary | ICD-10-CM | POA: Insufficient documentation

## 2021-05-18 DIAGNOSIS — H532 Diplopia: Secondary | ICD-10-CM | POA: Insufficient documentation

## 2021-05-18 DIAGNOSIS — I160 Hypertensive urgency: Secondary | ICD-10-CM | POA: Diagnosis not present

## 2021-05-18 DIAGNOSIS — R519 Headache, unspecified: Secondary | ICD-10-CM

## 2021-05-18 DIAGNOSIS — M542 Cervicalgia: Secondary | ICD-10-CM | POA: Diagnosis not present

## 2021-05-18 DIAGNOSIS — Z794 Long term (current) use of insulin: Secondary | ICD-10-CM | POA: Diagnosis not present

## 2021-05-18 LAB — CBC WITH DIFFERENTIAL/PLATELET
Abs Immature Granulocytes: 0.13 10*3/uL — ABNORMAL HIGH (ref 0.00–0.07)
Basophils Absolute: 0.1 10*3/uL (ref 0.0–0.1)
Basophils Relative: 1 %
Eosinophils Absolute: 0.2 10*3/uL (ref 0.0–0.5)
Eosinophils Relative: 3 %
HCT: 32.6 % — ABNORMAL LOW (ref 36.0–46.0)
Hemoglobin: 10.1 g/dL — ABNORMAL LOW (ref 12.0–15.0)
Immature Granulocytes: 2 %
Lymphocytes Relative: 23 %
Lymphs Abs: 1.7 10*3/uL (ref 0.7–4.0)
MCH: 30.1 pg (ref 26.0–34.0)
MCHC: 31 g/dL (ref 30.0–36.0)
MCV: 97 fL (ref 80.0–100.0)
Monocytes Absolute: 0.5 10*3/uL (ref 0.1–1.0)
Monocytes Relative: 6 %
Neutro Abs: 4.8 10*3/uL (ref 1.7–7.7)
Neutrophils Relative %: 65 %
Platelets: 245 10*3/uL (ref 150–400)
RBC: 3.36 MIL/uL — ABNORMAL LOW (ref 3.87–5.11)
RDW: 15.9 % — ABNORMAL HIGH (ref 11.5–15.5)
WBC: 7.3 10*3/uL (ref 4.0–10.5)
nRBC: 0 % (ref 0.0–0.2)

## 2021-05-18 LAB — CBG MONITORING, ED
Glucose-Capillary: 65 mg/dL — ABNORMAL LOW (ref 70–99)
Glucose-Capillary: 64 mg/dL — ABNORMAL LOW (ref 70–99)
Glucose-Capillary: 68 mg/dL — ABNORMAL LOW (ref 70–99)
Glucose-Capillary: 88 mg/dL (ref 70–99)
Glucose-Capillary: 122 mg/dL — ABNORMAL HIGH (ref 70–99)

## 2021-05-18 LAB — IRON AND TIBC
Iron: 77 ug/dL (ref 28–170)
Saturation Ratios: 20 % (ref 10.4–31.8)
TIBC: 376 ug/dL (ref 250–450)
UIBC: 299 ug/dL

## 2021-05-18 LAB — TROPONIN I (HIGH SENSITIVITY)
Troponin I (High Sensitivity): 16 ng/L (ref <18)
Troponin I (High Sensitivity): 15 ng/L (ref <18)

## 2021-05-18 LAB — RENAL FUNCTION PANEL
Albumin: 3.9 g/dL (ref 3.5–5.0)
Anion gap: 10 (ref 5–15)
BUN: 34 mg/dL — ABNORMAL HIGH (ref 8–23)
CO2: 22 mmol/L (ref 22–32)
Calcium: 9.5 mg/dL (ref 8.9–10.3)
Chloride: 105 mmol/L (ref 98–111)
Creatinine, Ser: 1.76 mg/dL — ABNORMAL HIGH (ref 0.44–1.00)
GFR, Estimated: 31 mL/min — ABNORMAL LOW (ref >60)
Glucose, Bld: 108 mg/dL — ABNORMAL HIGH (ref 70–99)
Phosphorus: 4 mg/dL (ref 2.5–4.6)
Potassium: 4.4 mmol/L (ref 3.5–5.1)
Sodium: 137 mmol/L (ref 135–145)

## 2021-05-18 LAB — PROTEIN / CREATININE RATIO, URINE
Creatinine, Urine: 34.74 mg/dL
Protein Creatinine Ratio: 4.12 mg/mg{Cre} — ABNORMAL HIGH (ref 0.00–0.15)
Total Protein, Urine: 143 mg/dL

## 2021-05-18 LAB — POCT HEMOGLOBIN-HEMACUE: Hemoglobin: 10.1 g/dL — ABNORMAL LOW (ref 12.0–15.0)

## 2021-05-18 MED ORDER — PROMETHAZINE HCL 25 MG PO TABS: 25.0000 mg | ORAL_TABLET | Freq: Four times a day (QID) | ORAL | 0 refills | Status: DC | PRN

## 2021-05-18 MED ORDER — HYDRALAZINE HCL 25 MG PO TABS: 25.0000 mg | ORAL_TABLET | Freq: Three times a day (TID) | ORAL | 0 refills | Status: DC

## 2021-05-18 MED ORDER — HYDROMORPHONE HCL 1 MG/ML IJ SOLN
1.0000 mg | Freq: Once | INTRAMUSCULAR | Status: AC
  Administered 2021-05-18: 1 mg via INTRAVENOUS
  Filled 2021-05-18: qty 1

## 2021-05-18 MED ORDER — SODIUM CHLORIDE 0.9 % IV SOLN
12.5000 mg | Freq: Once | INTRAVENOUS | Status: DC
  Filled 2021-05-18: qty 0.5

## 2021-05-18 MED ORDER — HYDROMORPHONE HCL 2 MG PO TABS: 1.0000 mg | ORAL_TABLET | ORAL | 0 refills | Status: DC | PRN

## 2021-05-18 MED ORDER — LABETALOL HCL 5 MG/ML IV SOLN
20.0000 mg | Freq: Once | INTRAVENOUS | Status: AC
  Administered 2021-05-18: 20 mg via INTRAVENOUS
  Filled 2021-05-18: qty 4

## 2021-05-18 MED ORDER — EPOETIN ALFA-EPBX 3000 UNIT/ML IJ SOLN: 3000.0000 [IU] | Freq: Once | INTRAMUSCULAR | Status: DC

## 2021-05-18 MED ORDER — HYDRALAZINE HCL 20 MG/ML IJ SOLN
10.0000 mg | Freq: Once | INTRAMUSCULAR | Status: AC
  Administered 2021-05-18: 10 mg via INTRAVENOUS
  Filled 2021-05-18: qty 1

## 2021-05-18 NOTE — ED Notes (Signed)
Posterior neck and headache.

## 2021-05-18 NOTE — ED Notes (Signed)
MRI called and said the patient stated that she felt like her blood sugar was dropping.  Michela Pitcher they checked it and it was 51.  Charge nurse went to check on patient.

## 2021-05-18 NOTE — ED Notes (Signed)
CBG 88 after 2 sodas and a pack of peanut butter crackers

## 2021-05-18 NOTE — ED Triage Notes (Signed)
Referred from her doctor for evaluation of blood pressure today in the office

## 2021-05-18 NOTE — Progress Notes (Signed)
Office called regarding BP of 229/100. Thayer Headings asked that she be sent to the office.

## 2021-05-18 NOTE — ED Provider Notes (Signed)
[facility] EMERGENCY DEPARTMENT Provider Note   CSN: [id number] Arrival date & time: 05/18/21  1601     History Chief Complaint  Patient presents with   Hypertension    Heather [name] is a 70 y.o. female.  Pt presents to the ED today with htn.  Pt has a hx of htn.  She said she's been having trouble with it staying elevated.  She has been taking her meds.  She went to her nephrologist's office for an injection, but bp was high, so she was sent here.  She denies cp.  She does have a headache and neck pain.  She has some double vision in both eyes.  No weakness anywhere.  She had a cbc and a bmp drawn today.      Past Medical History:  Diagnosis Date   Acute renal failure superimposed on stage 3 chronic kidney disease ([place]) 01/21/2016   Anemia    Anxiety    B12 deficiency    Brain tumor (benign) ([place]) 10/29/2020   Cellulitis and abscess    Abdomen and buttocks   CKD (chronic kidney disease) stage 3, GFR 30-59 ml/min (HCC)    Closed fracture of distal end of femur, unspecified fracture morphology, initial encounter ([place])    Depression    Diabetic neuropathy ([place])    feet   DJD (degenerative joint disease)    Right forminal stenosis C4-5   Essential hypertension    Femur fracture, right ([place]) 0/12/5007   Folliculitis    GERD (gastroesophageal reflux disease)    Gout    Headache(784.0)    Hiatal hernia    History of blood transfusion    History of bronchitis    History of cardiac catheterization    No significant CAD 2012   History of kidney stones    surgery to remove   Insomnia    Irritable bowel syndrome    Mixed hyperlipidemia    Osteoarthritis    Palpitations    Pneumonia    Reflux esophagitis    Salmonella    Stroke ([place])    Tinnitus    Right   Type 2 diabetes mellitus ([place])     Patient Active Problem List   Diagnosis Date Noted   Acute pancreatitis    Anemia    E coli enteritis 01/09/2021   Acute on chronic pancreatitis ([place]) 01/08/2021    Acute renal failure superimposed on stage 3b chronic kidney disease ([place]) 01/07/2021   Diarrhea    Acute hypokalemia 01/06/2021   Long-term use of immunosuppressant medication-anticipated 11/28/2020   Headache 05/17/2020   Hypokalemia 05/17/2020   Hypomagnesemia 05/17/2020   Dehydration 05/17/2020   Hypertensive urgency 05/17/2020   Diplopia 05/17/2020   Left hip pain 09/22/2019   Lumbar radiculopathy 09/22/2019   Postoperative anemia due to acute blood loss 09/18/2019   Postoperative urinary retention 09/18/2019   Closed fracture of distal end of radius 05/26/2019   Closed right hip fracture ([place]) 04/08/2018   Elevated lipase    Crohn's disease of small intestine with other complication ([place])    Protein-calorie malnutrition, severe 03/05/2018   Neck pain 03/12/2017   Acute kidney injury superimposed on CKD ([place]) 01/25/2016   Neuropathy 03/02/2015   Pain in the chest    Chest pain 12/22/2014   Essential hypertension 12/22/2014   Type 2 diabetes with nephropathy ([place]) 12/22/2014   Hiatal hernia 12/22/2014   GERD (gastroesophageal reflux disease) 12/22/2014   Gout 12/22/2014   Mixed hyperlipidemia 12/22/2014  Depression 12/22/2014   Type 2 diabetes mellitus without complications ([place]) 00/93/8182   Fusion of spine, cervicothoracic region 11/01/2014   Pseudophakia, left eye 10/20/2014   Chronic kidney disease, stage III (moderate) (HCC) 08/25/2014   Cervical stenosis of spine 08/26/2013   Vitamin B12 deficiency 07/08/2011   Personal history of failed moderate sedation 04/16/2011   Chronic Nausea and Vomiting - ? gastroapresis 04/16/2011   Early satiety 04/16/2011   Irritable bowel syndrome 04/16/2011   Benign paroxysmal positional vertigo 11/20/2010   TRICUSPID REGURGITATION, MODERATE 11/20/2010   PULMONARY HYPERTENSION, MILD 11/20/2010   LEG EDEMA, BILATERAL 11/20/2010   DYSPNEA 11/20/2010   NONSPECIFIC ABNORMAL ELECTROCARDIOGRAM 11/20/2010   CHEST WALL PAIN, HX OF  11/20/2010    Past Surgical History:  Procedure Laterality Date   BACK SURGERY     c-spine surgery     03/2007   CARDIAC CATHETERIZATION     CHOLECYSTECTOMY     COLONOSCOPY  04/21/2011; 05/29/11   6/12: [place] - ?AVM at IC valve, inflammatory changes at IC valve [facility]); 7/12 - [facility]; IC valve erosions, look chronic and probably Crohn's per path   colonoscopy  2017   [place]: random biopsies normal. TI normal   ESOPHAGOGASTRODUODENOSCOPY  05/29/11   normal   ESOPHAGOGASTRODUODENOSCOPY N/A 01/21/2016   Dr. Michail Sermon: normal   EYE SURGERY Bilateral    cataracts removed   FEMUR IM NAIL Right 04/10/2018   Procedure: INTRAMEDULLARY (IM) NAIL FEMORAL;  Surgeon: Paralee Cancel, MD;  Location: [facility];  Service: Orthopedics;  Laterality: Right;   FRACTURE SURGERY     GIVENS CAPSULE STUDY  11/17/2020   HARDWARE REMOVAL Right 04/10/2018   Procedure: HARDWARE REMOVAL RIGHT DISTAL FEMUR;  Surgeon: Paralee Cancel, MD;  Location: [facility];  Service: Orthopedics;  Laterality: Right;   HERNIA REPAIR     IR FLUORO GUIDE CV LINE RIGHT  04/01/2017   IR US GUIDE VASC ACCESS RIGHT  04/01/2017   JOINT REPLACEMENT Right    hip   OPEN REDUCTION INTERNAL FIXATION (ORIF) DISTAL RADIAL FRACTURE Right 05/28/2019   Procedure: OPEN REDUCTION INTERNAL FIXATION (ORIF) DISTAL RADIAL FRACTURE;  Surgeon: Verner Mould, MD;  Location: [place];  Service: Orthopedics;  Laterality: Right;  with block 90 minutes   ORIF FEMUR FRACTURE Right 03/31/2017   Procedure: OPEN REDUCTION INTERNAL FIXATION (ORIF) DISTAL FEMUR FRACTURE;  Surgeon: Paralee Cancel, MD;  Location: [facility];  Service: Orthopedics;  Laterality: Right;   removal of kidney stone     Right knee arthroscopic surgery     TONSILLECTOMY     TOTAL ABDOMINAL HYSTERECTOMY       OB History   No obstetric history on file.     Family History  Problem Relation Age of Onset   Diabetes Mother    Colon cancer Brother        POSSIBLY. Patient states  diagnosed at age 71, then later states age 89. unclear and limited historian   Esophageal cancer Neg Hx    Rectal cancer Neg Hx    Stomach cancer Neg Hx     Social History   Tobacco Use   Smoking status: Never   Smokeless tobacco: Never  Vaping Use   Vaping Use: Never used  Substance Use Topics   Alcohol use: No   Drug use: No    Home Medications Prior to Admission medications   Medication Sig Start Date End Date Taking? Authorizing Provider  acetaminophen (TYLENOL) 650 MG CR tablet Take 650 mg by  mouth every 8 (eight) hours as needed for pain.   Yes [provider]  Adalimumab (HUMIRA PEN) 40 MG/0.4ML PNKT Inject 40 mg into the skin every 14 (fourteen) days. After the completion of the starter kit Patient taking differently: Inject 40 mg into the skin every 14 (fourteen) days. 01/16/21  Yes Barton Dubois, MD  albuterol (VENTOLIN HFA) 108 (90 Base) MCG/ACT inhaler Inhale 1-2 puffs into the lungs every 6 (six) hours as needed for shortness of breath or wheezing. 02/16/21 02/16/22 Yes [provider]  allopurinol (ZYLOPRIM) 100 MG tablet Take 100 mg by mouth daily. 03/20/21  Yes [provider]  Ascorbic Acid (VITAMIN C WITH ROSE HIPS) 500 MG tablet Take 500 mg by mouth every morning.   Yes [provider]  atorvastatin (LIPITOR) 80 MG tablet Take 80 mg by mouth every morning.   Yes [provider]  Cholecalciferol (VITAMIN D) 2000 units CAPS Take 2,000 Units by mouth every morning.    Yes [provider]  citalopram (CELEXA) 20 MG tablet Take 1 tablet (20 mg total) by mouth daily. 01/12/21  Yes Barton Dubois, MD  cloNIDine (CATAPRES) 0.2 MG tablet Take 0.2 mg by mouth 2 (two) times daily.   Yes [provider]  cyanocobalamin (,VITAMIN B-12,) 1000 MCG/ML injection Inject 1,000 mcg into the muscle every 30 (thirty) days.     Yes [provider]  dicyclomine (BENTYL) 20 MG tablet Take 1 tablet (20 mg total) by mouth 3  (three) times daily before meals. Patient taking differently: Take 20 mg by mouth in the morning and at bedtime. 01/12/21  Yes Barton Dubois, MD  epoetin alfa-epbx (RETACRIT) 3000 UNIT/ML injection Inject 3,000 Units into the skin every 14 (fourteen) days. 11/21/20  Yes Bhutani, Manpreet S, MD  esomeprazole (NEXIUM) 40 MG capsule Take 40 mg by mouth daily before breakfast.     Yes [provider]  fenofibrate (TRICOR) 145 MG tablet Take 1 tablet (145 mg total) by mouth daily. 09/07/20  Yes BranchAlphonse Guild, MD  furosemide (LASIX) 40 MG tablet Take 40 mg by mouth in the morning, at noon, and at bedtime.   Yes [provider]  gabapentin (NEURONTIN) 300 MG capsule Take 300 mg by mouth in the morning and at bedtime. *May take one additional capsule as needed for pain 10/20/20  Yes [provider]  hydrALAZINE (APRESOLINE) 25 MG tablet Take 1 tablet (25 mg total) by mouth 3 (three) times daily. 05/18/21 06/17/21 Yes Isla Pence, MD  HYDROmorphone (DILAUDID) 2 MG tablet Take 0.5 tablets (1 mg total) by mouth every 4 (four) hours as needed for severe pain. 05/18/21  Yes Isla Pence, MD  insulin detemir (LEVEMIR FLEXTOUCH) 100 UNIT/ML FlexPen Inject 90 Units into the skin at bedtime. 04/26/21  Yes Brita Romp, NP  Insulin Lispro (HUMALOG KWIKPEN [place]) Inject 22-28 Units into the skin 3 (three) times daily with meals. Sliding scale   Yes [provider]  lipase/protease/amylase (CREON) 36000 UNITS CPEP capsule Take 1 capsule (36,000 Units total) by mouth with snacks. Patient taking differently: Take 36,000-72,000 Units by mouth See admin instructions. Takes 2 capsules with all meals and takes one capsule with snacks 01/12/21  Yes Barton Dubois, MD  meclizine (ANTIVERT) 25 MG tablet Take 1 tablet by mouth as needed for dizziness or nausea.   Yes [provider]  metoprolol tartrate (LOPRESSOR) 100 MG tablet Take 100 mg by mouth 2 (two) times daily. 05/07/21   Yes [provider]  omega-3 acid ethyl esters (LOVAZA) 1 g capsule Take 2 capsules by mouth 2 (two) times daily. 10/11/20  Yes [provider]  OZEMPIC, 0.25 OR 0.5 MG/DOSE, 2 MG/1.5ML SOPN Inject 0.5 mg into the skin every Sunday. 04/14/21  Yes [provider]  potassium chloride SA (K-DUR,KLOR-CON) 20 MEQ tablet Take 20 mEq by mouth every morning.   Yes [provider]  promethazine (PHENERGAN) 25 MG tablet Take 1 tablet (25 mg total) by mouth every 6 (six) hours as needed for nausea or vomiting. 05/18/21  Yes Isla Pence, MD  temazepam (RESTORIL) 15 MG capsule Take 15 mg by mouth at bedtime as needed for sleep. 04/14/21  Yes [provider]  tiZANidine (ZANAFLEX) 4 MG tablet Take 8 mg by mouth at bedtime. 09/06/20  Yes [provider]  venlafaxine XR (EFFEXOR-XR) 150 MG 24 hr capsule Take 150 mg by mouth daily. 04/27/21  Yes [provider]  butalbital-acetaminophen-caffeine (FIORICET) 50-325-40 MG tablet Take 1 tablet by mouth every 6 (six) hours as needed for migraine. 05/19/20 05/19/21  Barton Dubois, MD  diphenoxylate-atropine (LOMOTIL) 2.5-0.025 MG tablet 1-2 tablets qid prn diarrhea Instead of Imodium Patient not taking: No sig reported 12/06/20   Gatha Mayer, MD    Allergies    Duloxetine hcl, Fluocinolone, Acetaminophen, Amlodipine besylate, Ciprofloxacin, Hydrocodone-acetaminophen, Metformin, Nsaids, Quinolones, Sulfamethoxazole, Valsartan, Cozaar [losartan], Lisinopril, and Tape  Review of Systems   Review of Systems  Neurological:  Positive for headaches.  All other systems reviewed and are negative.  Physical Exam Updated Vital Signs BP (!) 118/57   Pulse 83   Temp 97.6 F (36.4 C) (Oral)   Resp (!) 9   LMP  (LMP Unknown)   SpO2 92%   Physical Exam Vitals and nursing note reviewed.  Constitutional:      Appearance: Normal appearance.  HENT:     Head: Normocephalic and atraumatic.     Right Ear:  External ear normal.     Left Ear: External ear normal.     Nose: Nose normal.     Mouth/Throat:     Mouth: Mucous membranes are moist.     Pharynx: Oropharynx is clear.  Eyes:     Extraocular Movements: Extraocular movements intact.     Conjunctiva/sclera: Conjunctivae normal.     Pupils: Pupils are equal, round, and reactive to light.  Cardiovascular:     Rate and Rhythm: Normal rate and regular rhythm.     Pulses: Normal pulses.     Heart sounds: Normal heart sounds.  Pulmonary:     Effort: Pulmonary effort is normal.     Breath sounds: Normal breath sounds.  Abdominal:     General: Abdomen is flat. Bowel sounds are normal.     Palpations: Abdomen is soft.  Musculoskeletal:        General: Normal range of motion.     Cervical back: Normal range of motion and neck supple.  Skin:    General: Skin is warm.     Capillary Refill: Capillary refill takes less than 2 seconds.  Neurological:     General: No focal deficit present.     Mental Status: She is alert and oriented to person, place, and time.  Psychiatric:        Mood and Affect: Mood normal.        Behavior: Behavior normal.        Thought Content: Thought content normal.        Judgment: Judgment normal.  ED Results / Procedures / Treatments   Labs (all labs ordered are listed, but only abnormal results are displayed) Labs Reviewed  CBG MONITORING, ED - Abnormal; Notable for the following components:      Result Value   Glucose-Capillary 65 (*)    All other components within normal limits  CBG MONITORING, ED - Abnormal; Notable for the following components:   Glucose-Capillary 68 (*)    All other components within normal limits  CBG MONITORING, ED - Abnormal; Notable for the following components:   Glucose-Capillary 64 (*)    All other components within normal limits  CBG MONITORING, ED  TROPONIN I (HIGH SENSITIVITY)  TROPONIN I (HIGH SENSITIVITY)    EKG EKG Interpretation  Date/Time:  Thursday May 18 2021 17:04:46 EDT Ventricular Rate:  85 PR Interval:  178 QRS Duration: 82 QT Interval:  380 QTC Calculation: 452 R Axis:   37 Text Interpretation: Normal sinus rhythm Normal ECG No significant change since last tracing Confirmed by Isla Pence [phone]) on 05/18/2021 5:39:13 PM  Radiology MR BRAIN WO CONTRAST  Result Date: 05/18/2021 CLINICAL DATA:  Neurological deficit, acute, stroke suspected. Weakness 1 day. EXAM: MRI HEAD WITHOUT CONTRAST TECHNIQUE: Multiplanar, multiecho pulse sequences of the brain and surrounding structures were obtained without intravenous contrast. COMPARISON:  Head CT 03/29/2021.  MRI 05/17/2020. FINDINGS: Brain: Diffusion imaging does not show any acute or subacute infarction. Minimal chronic small-vessel ischemic changes are seen affecting the pons and cerebral hemispheric white matter. No sign of mass lesion, hemorrhage, hydrocephalus or extra-axial collection. Abbreviated stroke protocol was utilized, therefore evaluation of the small meningioma at the right anterior cranial fossa is limited. This does not appear to have enlarged as seen on the diffusion imaging. No evidence of hemorrhage, hydrocephalus or extra-axial collection. Vascular: Major vessels at the base of the brain show flow. Skull and upper cervical spine: Negative Sinuses/Orbits: Clear/normal Other: None IMPRESSION: No acute finding. Minimal chronic small-vessel change of the pons and cerebral hemispheric white matter. No apparent enlargement of a subcentimeter meningioma at the anterior cranial fossa on right. Electronically Signed   By: Nelson Chimes M.D.   On: 05/18/2021 17:45    Procedures Procedures   Medications Ordered in ED Medications  promethazine (PHENERGAN) 12.5 mg in sodium chloride 0.9 % 50 mL IVPB (has no administration in time range)  hydrALAZINE (APRESOLINE) injection 10 mg (10 mg Intravenous Given 05/18/21 1708)  HYDROmorphone (DILAUDID) injection 1 mg (1 mg Intravenous Given  05/18/21 1814)  labetalol (NORMODYNE) injection 20 mg (20 mg Intravenous Given 05/18/21 1905)  HYDROmorphone (DILAUDID) injection 1 mg (1 mg Intravenous Given 05/18/21 1940)    ED Course  I have reviewed the triage vital signs and the nursing notes.  Pertinent labs & imaging results that were available during my care of the patient were reviewed by me and considered in my medical decision making (see chart for details).    MDM Rules/Calculators/A&P                           MRI without acute stroke.  BP finally better after hydralazine and labetalol as well as treatment of her headache with dilaudid and phenergan.  Pt is feeling much better.  She is seeing better and has eaten.  She was on hydralazine at one point for her bp.  She is not sure why she was taken off.  She is put back on it.  She is to f/u with  Dr. Harl Bowie (cards).  Return if worse.  CRITICAL CARE Performed by: Isla Pence   Total critical care time: 30 minutes  Critical care time was exclusive of separately billable procedures and treating other patients.  Critical care was necessary to treat or prevent imminent or life-threatening deterioration.  Critical care was time spent personally by me on the following activities: development of treatment plan with patient and/or surrogate as well as nursing, discussions with consultants, evaluation of patient's response to treatment, examination of patient, obtaining history from patient or surrogate, ordering and performing treatments and interventions, ordering and review of laboratory studies, ordering and review of radiographic studies, pulse oximetry and re-evaluation of patient's condition.  Final Clinical Impression(s) / ED Diagnoses Final diagnoses:  Hypertensive urgency  Acute nonintractable headache, unspecified headache type    Rx / DC Orders ED Discharge Orders          Ordered    hydrALAZINE (APRESOLINE) 25 MG tablet  3 times daily        05/18/21 2013     HYDROmorphone (DILAUDID) 2 MG tablet  Every 4 hours PRN        05/18/21 2015    promethazine (PHENERGAN) 25 MG tablet  Every 6 hours PRN        05/18/21 2015             Isla Pence, MD 05/18/21 2018

## 2021-05-19 LAB — PTH, INTACT AND CALCIUM
Calcium, Total (PTH): 9.6 mg/dL (ref 8.7–10.3)
PTH: 123 pg/mL — ABNORMAL HIGH (ref 15–65)

## 2021-06-01 ENCOUNTER — Encounter (HOSPITAL_COMMUNITY): Payer: Self-pay

## 2021-06-01 ENCOUNTER — Encounter (HOSPITAL_COMMUNITY)
Admission: RE | Admit: 2021-06-01 | Discharge: 2021-06-01 | Disposition: A | Discharge status: Home / Self Care | Payer: 59 | Source: Ambulatory Visit | Attending: Nephrology | Admitting: Nephrology

## 2021-06-01 ENCOUNTER — Other Ambulatory Visit: Payer: Self-pay

## 2021-06-01 DIAGNOSIS — N184 Chronic kidney disease, stage 4 (severe): Secondary | ICD-10-CM | POA: Insufficient documentation

## 2021-06-01 DIAGNOSIS — D631 Anemia in chronic kidney disease: Secondary | ICD-10-CM | POA: Insufficient documentation

## 2021-06-01 LAB — POCT HEMOGLOBIN-HEMACUE: Hemoglobin: 10.5 g/dL — ABNORMAL LOW (ref 12.0–15.0)

## 2021-06-01 MED ORDER — EPOETIN ALFA-EPBX 3000 UNIT/ML IJ SOLN
3000.0000 [IU] | Freq: Once | INTRAMUSCULAR | Status: DC
Start: 2021-06-01 — End: 2021-06-01

## 2021-06-15 ENCOUNTER — Encounter (HOSPITAL_COMMUNITY): Admission: RE | Admit: 2021-06-15 | Payer: 59 | Source: Ambulatory Visit

## 2021-06-20 ENCOUNTER — Encounter (HOSPITAL_COMMUNITY)
Admission: RE | Admit: 2021-06-20 | Discharge: 2021-06-20 | Disposition: A | Discharge status: Home / Self Care | Payer: 59 | Source: Ambulatory Visit | Attending: Nephrology | Admitting: Nephrology

## 2021-06-20 ENCOUNTER — Other Ambulatory Visit: Payer: Self-pay

## 2021-06-20 DIAGNOSIS — N184 Chronic kidney disease, stage 4 (severe): Secondary | ICD-10-CM | POA: Diagnosis not present

## 2021-06-20 LAB — IRON AND TIBC
Iron: 63 ug/dL (ref 28–170)
Saturation Ratios: 21 % (ref 10.4–31.8)
TIBC: 298 ug/dL (ref 250–450)
UIBC: 235 ug/dL

## 2021-06-20 LAB — CBC
HCT: 27.8 % — ABNORMAL LOW (ref 36.0–46.0)
Hemoglobin: 9.3 g/dL — ABNORMAL LOW (ref 12.0–15.0)
MCH: 32.2 pg (ref 26.0–34.0)
MCHC: 33.5 g/dL (ref 30.0–36.0)
MCV: 96.2 fL (ref 80.0–100.0)
Platelets: 206 10*3/uL (ref 150–400)
RBC: 2.89 MIL/uL — ABNORMAL LOW (ref 3.87–5.11)
RDW: 15.3 % (ref 11.5–15.5)
WBC: 6.1 10*3/uL (ref 4.0–10.5)
nRBC: 0 % (ref 0.0–0.2)

## 2021-06-20 LAB — PROTEIN / CREATININE RATIO, URINE
Creatinine, Urine: 59.86 mg/dL
Protein Creatinine Ratio: 2.31 mg/mg{Cre} — ABNORMAL HIGH (ref 0.00–0.15)
Total Protein, Urine: 138 mg/dL

## 2021-06-20 LAB — RENAL FUNCTION PANEL
Albumin: 3.5 g/dL (ref 3.5–5.0)
Anion gap: 12 (ref 5–15)
BUN: 30 mg/dL — ABNORMAL HIGH (ref 8–23)
CO2: 25 mmol/L (ref 22–32)
Calcium: 7.9 mg/dL — ABNORMAL LOW (ref 8.9–10.3)
Chloride: 102 mmol/L (ref 98–111)
Creatinine, Ser: 1.63 mg/dL — ABNORMAL HIGH (ref 0.44–1.00)
GFR, Estimated: 34 mL/min — ABNORMAL LOW (ref >60)
Glucose, Bld: 116 mg/dL — ABNORMAL HIGH (ref 70–99)
Phosphorus: 3.6 mg/dL (ref 2.5–4.6)
Potassium: 3.3 mmol/L — ABNORMAL LOW (ref 3.5–5.1)
Sodium: 139 mmol/L (ref 135–145)

## 2021-06-20 LAB — POCT HEMOGLOBIN-HEMACUE: Hemoglobin: 9.2 g/dL — ABNORMAL LOW (ref 12.0–15.0)

## 2021-06-20 MED ORDER — EPOETIN ALFA-EPBX 3000 UNIT/ML IJ SOLN
3000.0000 [IU] | Freq: Once | INTRAMUSCULAR | Status: AC
  Administered 2021-06-20: 3000 [IU] via SUBCUTANEOUS

## 2021-06-20 MED ORDER — EPOETIN ALFA-EPBX 3000 UNIT/ML IJ SOLN
INTRAMUSCULAR | Status: AC
  Filled 2021-06-20: qty 1

## 2021-06-21 LAB — PTH, INTACT AND CALCIUM
Calcium, Total (PTH): 8.1 mg/dL — ABNORMAL LOW (ref 8.7–10.3)
PTH: 149 pg/mL — ABNORMAL HIGH (ref 15–65)

## 2021-07-04 ENCOUNTER — Encounter (HOSPITAL_COMMUNITY)
Admission: RE | Admit: 2021-07-04 | Discharge: 2021-07-04 | Disposition: A | Discharge status: Home / Self Care | Payer: 59 | Source: Ambulatory Visit | Attending: Nephrology | Admitting: Nephrology

## 2021-07-04 DIAGNOSIS — N184 Chronic kidney disease, stage 4 (severe): Secondary | ICD-10-CM | POA: Insufficient documentation

## 2021-07-04 DIAGNOSIS — D631 Anemia in chronic kidney disease: Secondary | ICD-10-CM | POA: Insufficient documentation

## 2021-07-12 LAB — HEMOGLOBIN A1C: Hemoglobin A1C: 7

## 2021-07-12 NOTE — Progress Notes (Signed)
07/12/2021 - [id number] Message left for pt to call Chrys Racer [phone], to reschedule her appt for Hgb check and possible retacrit injection.

## 2021-07-19 ENCOUNTER — Other Ambulatory Visit: Payer: Self-pay

## 2021-07-19 ENCOUNTER — Encounter (HOSPITAL_COMMUNITY): Payer: Self-pay

## 2021-07-19 ENCOUNTER — Encounter (HOSPITAL_COMMUNITY)
Admission: RE | Admit: 2021-07-19 | Discharge: 2021-07-19 | Disposition: A | Discharge status: Home / Self Care | Payer: 59 | Source: Ambulatory Visit | Attending: Nephrology | Admitting: Nephrology

## 2021-07-19 DIAGNOSIS — N184 Chronic kidney disease, stage 4 (severe): Secondary | ICD-10-CM | POA: Diagnosis present

## 2021-07-19 DIAGNOSIS — D631 Anemia in chronic kidney disease: Secondary | ICD-10-CM | POA: Diagnosis not present

## 2021-07-19 LAB — POCT HEMOGLOBIN-HEMACUE: Hemoglobin: 11.8 g/dL — ABNORMAL LOW (ref 12.0–15.0)

## 2021-07-19 MED ORDER — EPOETIN ALFA-EPBX 3000 UNIT/ML IJ SOLN: 3000.0000 [IU] | Freq: Once | INTRAMUSCULAR | Status: DC

## 2021-08-01 ENCOUNTER — Ambulatory Visit: Payer: 59 | Admitting: Nurse Practitioner

## 2021-08-02 ENCOUNTER — Other Ambulatory Visit: Payer: Self-pay

## 2021-08-02 ENCOUNTER — Encounter (HOSPITAL_COMMUNITY)
Admission: RE | Admit: 2021-08-02 | Discharge: 2021-08-02 | Disposition: A | Discharge status: Home / Self Care | Payer: 59 | Source: Ambulatory Visit | Attending: Nephrology | Admitting: Nephrology

## 2021-08-02 DIAGNOSIS — N184 Chronic kidney disease, stage 4 (severe): Secondary | ICD-10-CM | POA: Insufficient documentation

## 2021-08-02 LAB — IRON AND TIBC
Iron: 85 ug/dL (ref 28–170)
Saturation Ratios: 27 % (ref 10.4–31.8)
TIBC: 317 ug/dL (ref 250–450)
UIBC: 232 ug/dL

## 2021-08-02 LAB — RENAL FUNCTION PANEL
Albumin: 3.7 g/dL (ref 3.5–5.0)
Anion gap: 11 (ref 5–15)
BUN: 33 mg/dL — ABNORMAL HIGH (ref 8–23)
CO2: 24 mmol/L (ref 22–32)
Calcium: 9 mg/dL (ref 8.9–10.3)
Chloride: 105 mmol/L (ref 98–111)
Creatinine, Ser: 2.13 mg/dL — ABNORMAL HIGH (ref 0.44–1.00)
GFR, Estimated: 25 mL/min — ABNORMAL LOW (ref >60)
Glucose, Bld: 151 mg/dL — ABNORMAL HIGH (ref 70–99)
Phosphorus: 3.4 mg/dL (ref 2.5–4.6)
Potassium: 3.2 mmol/L — ABNORMAL LOW (ref 3.5–5.1)
Sodium: 140 mmol/L (ref 135–145)

## 2021-08-02 LAB — CBC
HCT: 31.3 % — ABNORMAL LOW (ref 36.0–46.0)
Hemoglobin: 10.1 g/dL — ABNORMAL LOW (ref 12.0–15.0)
MCH: 31.1 pg (ref 26.0–34.0)
MCHC: 32.3 g/dL (ref 30.0–36.0)
MCV: 96.3 fL (ref 80.0–100.0)
Platelets: 208 10*3/uL (ref 150–400)
RBC: 3.25 MIL/uL — ABNORMAL LOW (ref 3.87–5.11)
RDW: 15.3 % (ref 11.5–15.5)
WBC: 9.2 10*3/uL (ref 4.0–10.5)
nRBC: 0 % (ref 0.0–0.2)

## 2021-08-02 LAB — PROTEIN / CREATININE RATIO, URINE
Creatinine, Urine: 171.75 mg/dL
Protein Creatinine Ratio: 4.63 mg/mg{Cre} — ABNORMAL HIGH (ref 0.00–0.15)
Total Protein, Urine: 795 mg/dL

## 2021-08-02 LAB — POCT HEMOGLOBIN-HEMACUE: Hemoglobin: 10.5 g/dL — ABNORMAL LOW (ref 12.0–15.0)

## 2021-08-02 MED ORDER — EPOETIN ALFA-EPBX 3000 UNIT/ML IJ SOLN: 3000.0000 [IU] | Freq: Once | INTRAMUSCULAR | Status: DC

## 2021-08-02 NOTE — Patient Instructions (Signed)
Diabetes Mellitus and Nutrition, Adult When you have diabetes, or diabetes mellitus, it is very important to have healthy eating habits because your blood sugar (glucose) levels are greatly affected by what you eat and drink. Eating healthy foods in the right amounts, at about the same times every day, can help you:  Control your blood glucose.  Lower your risk of heart disease.  Improve your blood pressure.  Reach or maintain a healthy weight. What can affect my meal plan? Every person with diabetes is different, and each person has different needs for a meal plan. Your health care provider may recommend that you work with a dietitian to make a meal plan that is best for you. Your meal plan may vary depending on factors such as:  The calories you need.  The medicines you take.  Your weight.  Your blood glucose, blood pressure, and cholesterol levels.  Your activity level.  Other health conditions you have, such as heart or kidney disease. How do carbohydrates affect me? Carbohydrates, also called carbs, affect your blood glucose level more than any other type of food. Eating carbs naturally raises the amount of glucose in your blood. Carb counting is a method for keeping track of how many carbs you eat. Counting carbs is important to keep your blood glucose at a healthy level, especially if you use insulin or take certain oral diabetes medicines. It is important to know how many carbs you can safely have in each meal. This is different for every person. Your dietitian can help you calculate how many carbs you should have at each meal and for each snack. How does alcohol affect me? Alcohol can cause a sudden decrease in blood glucose (hypoglycemia), especially if you use insulin or take certain oral diabetes medicines. Hypoglycemia can be a life-threatening condition. Symptoms of hypoglycemia, such as sleepiness, dizziness, and confusion, are similar to symptoms of having too much  alcohol.  Do not drink alcohol if: ? Your health care provider tells you not to drink. ? You are pregnant, may be pregnant, or are planning to become pregnant.  If you drink alcohol: ? Do not drink on an empty stomach. ? Limit how much you use to:  0-1 drink a day for women.  0-2 drinks a day for men. ? Be aware of how much alcohol is in your drink. In the [place] one drink equals one 12 oz bottle of beer (355 mL), one 5 oz glass of wine (148 mL), or one 1 oz glass of hard liquor (44 mL). ? Keep yourself hydrated with water, diet soda, or unsweetened iced tea.  Keep in mind that regular soda, juice, and other mixers may contain a lot of sugar and must be counted as carbs. What are tips for following this plan? Reading food labels  Start by checking the serving size on the "Nutrition Facts" label of packaged foods and drinks. The amount of calories, carbs, fats, and other nutrients listed on the label is based on one serving of the item. Many items contain more than one serving per package.  Check the total grams (g) of carbs in one serving. You can calculate the number of servings of carbs in one serving by dividing the total carbs by 15. For example, if a food has 30 g of total carbs per serving, it would be equal to 2 servings of carbs.  Check the number of grams (g) of saturated fats and trans fats in one serving. Choose foods that have   a low amount or none of these fats.  Check the number of milligrams (mg) of salt (sodium) in one serving. Most people should limit total sodium intake to less than 2,300 mg per day.  Always check the nutrition information of foods labeled as "low-fat" or "nonfat." These foods may be higher in added sugar or refined carbs and should be avoided.  Talk to your dietitian to identify your daily goals for nutrients listed on the label. Shopping  Avoid buying canned, pre-made, or processed foods. These foods tend to be high in fat, sodium, and added  sugar.  Shop around the outside edge of the grocery store. This is where you will most often find fresh fruits and vegetables, bulk grains, fresh meats, and fresh dairy. Cooking  Use low-heat cooking methods, such as baking, instead of high-heat cooking methods like deep frying.  Cook using healthy oils, such as olive, canola, or sunflower oil.  Avoid cooking with butter, cream, or high-fat meats. Meal planning  Eat meals and snacks regularly, preferably at the same times every day. Avoid going long periods of time without eating.  Eat foods that are high in fiber, such as fresh fruits, vegetables, beans, and whole grains. Talk with your dietitian about how many servings of carbs you can eat at each meal.  Eat 4-6 oz (112-168 g) of lean protein each day, such as lean meat, chicken, fish, eggs, or tofu. One ounce (oz) of lean protein is equal to: ? 1 oz (28 g) of meat, chicken, or fish. ? 1 egg. ?  cup (62 g) of tofu.  Eat some foods each day that contain healthy fats, such as avocado, nuts, seeds, and fish.   What foods should I eat? Fruits Berries. Apples. Oranges. Peaches. Apricots. Plums. Grapes. Mango. Papaya. Pomegranate. Kiwi. Cherries. Vegetables Lettuce. Spinach. Leafy greens, including kale, chard, collard greens, and mustard greens. Beets. Cauliflower. Cabbage. Broccoli. Carrots. Green beans. Tomatoes. Peppers. Onions. Cucumbers. Brussels sprouts. Grains Whole grains, such as whole-wheat or whole-grain bread, crackers, tortillas, cereal, and pasta. Unsweetened oatmeal. Quinoa. Brown or wild rice. Meats and other proteins Seafood. Poultry without skin. Lean cuts of poultry and beef. Tofu. Nuts. Seeds. Dairy Low-fat or fat-free dairy products such as milk, yogurt, and cheese. The items listed above may not be a complete list of foods and beverages you can eat. Contact a dietitian for more information. What foods should I avoid? Fruits Fruits canned with  syrup. Vegetables Canned vegetables. Frozen vegetables with butter or cream sauce. Grains Refined white flour and flour products such as bread, pasta, snack foods, and cereals. Avoid all processed foods. Meats and other proteins Fatty cuts of meat. Poultry with skin. Breaded or fried meats. Processed meat. Avoid saturated fats. Dairy Full-fat yogurt, cheese, or milk. Beverages Sweetened drinks, such as soda or iced tea. The items listed above may not be a complete list of foods and beverages you should avoid. Contact a dietitian for more information. Questions to ask a health care provider  Do I need to meet with a diabetes educator?  Do I need to meet with a dietitian?  What number can I call if I have questions?  When are the best times to check my blood glucose? Where to find more information:  American Diabetes Association: diabetes.org  Academy of Nutrition and Dietetics: www.eatright.org  National Institute of Diabetes and Digestive and Kidney Diseases: www.niddk.nih.gov  Association of Diabetes Care and Education Specialists: www.diabeteseducator.org Summary  It is important to have healthy eating   habits because your blood sugar (glucose) levels are greatly affected by what you eat and drink.  A healthy meal plan will help you control your blood glucose and maintain a healthy lifestyle.  Your health care provider may recommend that you work with a dietitian to make a meal plan that is best for you.  Keep in mind that carbohydrates (carbs) and alcohol have immediate effects on your blood glucose levels. It is important to count carbs and to use alcohol carefully. This information is not intended to replace advice given to you by your health care provider. Make sure you discuss any questions you have with your health care provider. Document Revised: 09/22/2019 Document Reviewed: 09/22/2019 Elsevier Patient Education  2021 [employer]  

## 2021-08-03 ENCOUNTER — Ambulatory Visit (INDEPENDENT_AMBULATORY_CARE_PROVIDER_SITE_OTHER): Payer: 59 | Admitting: Nurse Practitioner

## 2021-08-03 ENCOUNTER — Encounter: Payer: Self-pay | Admitting: Nurse Practitioner

## 2021-08-03 VITALS — BP 157/84 | HR 82 | Ht 62.0 in | Wt 147.6 lb

## 2021-08-03 DIAGNOSIS — E782 Mixed hyperlipidemia: Secondary | ICD-10-CM

## 2021-08-03 DIAGNOSIS — E1121 Type 2 diabetes mellitus with diabetic nephropathy: Secondary | ICD-10-CM

## 2021-08-03 DIAGNOSIS — I10 Essential (primary) hypertension: Secondary | ICD-10-CM | POA: Diagnosis not present

## 2021-08-03 LAB — PTH, INTACT AND CALCIUM
Calcium, Total (PTH): 9.3 mg/dL (ref 8.7–10.3)
PTH: 194 pg/mL — ABNORMAL HIGH (ref 15–65)

## 2021-08-03 MED ORDER — LEVEMIR FLEXTOUCH 100 UNIT/ML ~~LOC~~ SOPN
70.0000 [IU] | PEN_INJECTOR | Freq: Every day | SUBCUTANEOUS | 3 refills | Status: DC
Start: 2021-08-03 — End: 2021-09-06

## 2021-08-03 NOTE — Progress Notes (Signed)
Endocrinology Follow Up Note       08/03/2021, 9:58 AM   Subjective:    Patient ID: Heather [name], female    DOB: July 21, 1951.  Heather [name] is being seen in follow up after being seen in consultation for management of currently uncontrolled symptomatic diabetes requested by  [name], MD.   Past Medical History:  Diagnosis Date   Acute renal failure superimposed on stage 3 chronic kidney disease ([place]) 01/21/2016   Anemia    Anxiety    B12 deficiency    Brain tumor (benign) ([place]) 10/29/2020   Cellulitis and abscess    Abdomen and buttocks   CKD (chronic kidney disease) stage 3, GFR 30-59 ml/min (HCC)    Closed fracture of distal end of femur, unspecified fracture morphology, initial encounter ([place])    Depression    Diabetic neuropathy (HCC)    feet   DJD (degenerative joint disease)    Right forminal stenosis C4-5   Essential hypertension    Femur fracture, right ([place]) 03/30/3761   Folliculitis    GERD (gastroesophageal reflux disease)    Gout    Headache(784.0)    Hiatal hernia    History of blood transfusion    History of bronchitis    History of cardiac catheterization    No significant CAD 2012   History of kidney stones    surgery to remove   Insomnia    Irritable bowel syndrome    Mixed hyperlipidemia    Osteoarthritis    Palpitations    Pneumonia    Reflux esophagitis    Salmonella    Stroke [facility])    Tinnitus    Right   Type 2 diabetes mellitus ([place])     Past Surgical History:  Procedure Laterality Date   BACK SURGERY     c-spine surgery     03/2007   CARDIAC CATHETERIZATION     CHOLECYSTECTOMY     COLONOSCOPY  04/21/2011; 05/29/11   6/12: [place] - ?AVM at IC valve, inflammatory changes at IC valve [facility]); 7/12 - [facility]; IC valve erosions, look chronic and probably Crohn's per path   colonoscopy  2017   [place]: random biopsies normal. TI normal    ESOPHAGOGASTRODUODENOSCOPY  05/29/11   normal   ESOPHAGOGASTRODUODENOSCOPY N/A 01/21/2016   Dr. Michail Sermon: normal   EYE SURGERY Bilateral    cataracts removed   FEMUR IM NAIL Right 04/10/2018   Procedure: INTRAMEDULLARY (IM) NAIL FEMORAL;  Surgeon: Paralee Cancel, MD;  Location: [facility];  Service: Orthopedics;  Laterality: Right;   FRACTURE SURGERY     GIVENS CAPSULE STUDY  11/17/2020   HARDWARE REMOVAL Right 04/10/2018   Procedure: HARDWARE REMOVAL RIGHT DISTAL FEMUR;  Surgeon: Paralee Cancel, MD;  Location: [facility];  Service: Orthopedics;  Laterality: Right;   HERNIA REPAIR     IR FLUORO GUIDE CV LINE RIGHT  04/01/2017   IR US GUIDE VASC ACCESS RIGHT  04/01/2017   JOINT REPLACEMENT Right    hip   OPEN REDUCTION INTERNAL FIXATION (ORIF) DISTAL RADIAL FRACTURE Right 05/28/2019   Procedure: OPEN REDUCTION INTERNAL FIXATION (ORIF) DISTAL RADIAL FRACTURE;  Surgeon: Verner Mould, MD;  Location:  [place] [facility];  Service: Orthopedics;  Laterality: Right;  with block 90 minutes   ORIF FEMUR FRACTURE Right 03/31/2017   Procedure: OPEN REDUCTION INTERNAL FIXATION (ORIF) DISTAL FEMUR FRACTURE;  Surgeon: Paralee Cancel, MD;  Location: [place];  Service: Orthopedics;  Laterality: Right;   removal of kidney stone     Right knee arthroscopic surgery     TONSILLECTOMY     TOTAL ABDOMINAL HYSTERECTOMY      Social History   Socioeconomic History   Marital status: Married    Spouse name: Not on file   Number of children: 2   Years of education: Not on file   Highest education level: Not on file  Occupational History   Occupation: Disabled  Tobacco Use   Smoking status: Never   Smokeless tobacco: Never  Vaping Use   Vaping Use: Never used  Substance and Sexual Activity   Alcohol use: No   Drug use: No   Sexual activity: Not on file    Comment: Hysterectomy  Other Topics Concern   Not on file  Social History Narrative   Patient is married 2 children she is disabled, 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    Social Determinants of Health   Financial Resource Strain: Not on file  Food Insecurity: Not on file  Transportation Needs: Not on file  Physical Activity: Not on file  Stress: Not on file  Social Connections: Not on file    Family History  Problem Relation Age of Onset   Diabetes Mother    Colon cancer Brother        POSSIBLY. Patient states diagnosed at age 80, then later states age 31. unclear and limited historian   Esophageal cancer Neg Hx    Rectal cancer Neg Hx    Stomach cancer Neg Hx     Outpatient Encounter Medications as of 08/03/2021  Medication Sig   acetaminophen (TYLENOL) 650 MG CR tablet Take 650 mg by mouth every 8 (eight) hours as needed for pain.   Adalimumab (HUMIRA PEN) 40 MG/0.4ML PNKT Inject 40 mg into the skin every 14 (fourteen) days. After the completion of the starter kit (Patient taking differently: Inject 40 mg into the skin every 14 (fourteen) days.)   albuterol (VENTOLIN HFA) 108 (90 Base) MCG/ACT inhaler Inhale 1-2 puffs into the lungs every 6 (six) hours as needed for shortness of breath or wheezing.   allopurinol (ZYLOPRIM) 100 MG tablet Take 100 mg by mouth daily.   Ascorbic Acid (VITAMIN C WITH ROSE HIPS) 500 MG tablet Take 500 mg by mouth every morning.   atorvastatin (LIPITOR) 80 MG tablet Take 80 mg by mouth every morning.   Cholecalciferol (VITAMIN D) 2000 units CAPS Take 2,000 Units by mouth every morning.    citalopram (CELEXA) 20 MG tablet Take 1 tablet (20 mg total) by mouth daily.   cloNIDine (CATAPRES) 0.2 MG tablet Take 0.2 mg by mouth 2 (two) times daily.   cyanocobalamin (,VITAMIN B-12,) 1000 MCG/ML injection Inject 1,000 mcg into the muscle every 30 (thirty) days.     dicyclomine (BENTYL) 20 MG tablet Take 1 tablet (20 mg total) by mouth 3 (three)  times daily before meals. (Patient taking differently: Take 20 mg by mouth in  the morning and at bedtime.)   diphenoxylate-atropine (LOMOTIL) 2.5-0.025 MG tablet 1-2 tablets qid prn diarrhea Instead of Imodium   epoetin alfa-epbx (RETACRIT) 3000 UNIT/ML injection Inject 3,000 Units into the skin every 14 (fourteen) days.   esomeprazole (NEXIUM) 40 MG capsule Take 40 mg by mouth daily before breakfast.     fenofibrate (TRICOR) 145 MG tablet Take 1 tablet (145 mg total) by mouth daily.   fluconazole (DIFLUCAN) 150 MG tablet Take 150 mg by mouth once.   furosemide (LASIX) 40 MG tablet Take 40 mg by mouth in the morning, at noon, and at bedtime.   gabapentin (NEURONTIN) 300 MG capsule Take 300 mg by mouth in the morning and at bedtime. *May take one additional capsule as needed for pain   HYDROmorphone (DILAUDID) 2 MG tablet Take 0.5 tablets (1 mg total) by mouth every 4 (four) hours as needed for severe pain.   indomethacin (INDOCIN) 50 MG capsule Take 50 mg by mouth 3 (three) times daily.   Insulin Lispro (HUMALOG KWIKPEN [place]) Inject 20-26 Units into the skin 3 (three) times daily with meals. Sliding scale   lipase/protease/amylase (CREON) 36000 UNITS CPEP capsule Take 1 capsule (36,000 Units total) by mouth with snacks. (Patient taking differently: Take 36,000-72,000 Units by mouth See admin instructions. Takes 2 capsules with all meals and takes one capsule with snacks)   meclizine (ANTIVERT) 25 MG tablet Take 1 tablet by mouth as needed for dizziness or nausea.   metoprolol tartrate (LOPRESSOR) 100 MG tablet Take 100 mg by mouth 2 (two) times daily.   omega-3 acid ethyl esters (LOVAZA) 1 g capsule Take 2 capsules by mouth 2 (two) times daily.   potassium chloride SA (K-DUR,KLOR-CON) 20 MEQ tablet Take 20 mEq by mouth every morning.   promethazine (PHENERGAN) 25 MG tablet Take 1 tablet (25 mg total) by mouth every 6 (six) hours as needed for nausea or vomiting.   temazepam (RESTORIL) 15 MG capsule  Take 15 mg by mouth at bedtime as needed for sleep.   tiZANidine (ZANAFLEX) 4 MG tablet Take 8 mg by mouth at bedtime.   venlafaxine XR (EFFEXOR-XR) 150 MG 24 hr capsule Take 150 mg by mouth daily.   [DISCONTINUED] insulin detemir (LEVEMIR FLEXTOUCH) 100 UNIT/ML FlexPen Inject 90 Units into the skin at bedtime.   [DISCONTINUED] OZEMPIC, 0.25 OR 0.5 MG/DOSE, 2 MG/1.5ML SOPN Inject 0.5 mg into the skin every Sunday.   hydrALAZINE (APRESOLINE) 25 MG tablet Take 1 tablet (25 mg total) by mouth 3 (three) times daily.   insulin detemir (LEVEMIR FLEXTOUCH) 100 UNIT/ML FlexPen Inject 70 Units into the skin at bedtime.   No facility-administered encounter medications on file as of 08/03/2021.    ALLERGIES: Allergies  Allergen Reactions   Duloxetine Hcl Swelling   Fluocinolone Swelling   Acetaminophen Itching and Swelling    Pt states "some swelling"   Amlodipine Besylate Swelling    Swelling of feet, fluid retention   Ciprofloxacin Nausea And Vomiting and Swelling    Swelling of face, jaw, and lips   Hydrocodone-Acetaminophen Itching   Metformin Other (See Comments)    REACTION: GI UPSET   Nsaids Other (See Comments)    "HAS BLEEDING ULCERS"   Quinolones Swelling   Sulfamethoxazole Swelling    Swelling of feet, legs   Valsartan Swelling   Cozaar [Losartan] Swelling and Rash   Lisinopril Swelling and Rash    Oral swelling, and red streaks on arms/stomach   Tape Itching and Rash  Paper tape can only be used on this patient    VACCINATION STATUS: Immunization History  Administered Date(s) Administered   Influenza,inj,Quad PF,6+ Mos 12/26/2016   Pneumococcal Polysaccharide-23 12/26/2016    Diabetes She presents for her follow-up diabetic visit. She has type 2 diabetes mellitus. Onset time: diagnosed at approx age of 13. Her disease course has been improving. Hypoglycemia symptoms include nervousness/anxiousness, sweats and tremors. Pertinent negatives for hypoglycemia include no  headaches. Pertinent negatives for diabetes include no blurred vision, no fatigue, no polydipsia, no polyuria, no weakness and no weight loss. There are no hypoglycemic complications. Symptoms are improving. Diabetic complications include heart disease, nephropathy and peripheral neuropathy. Risk factors for coronary artery disease include diabetes mellitus, dyslipidemia, family history, hypertension, post-menopausal, sedentary lifestyle and tobacco exposure. Current diabetic treatment includes intensive insulin program. She is compliant with treatment most of the time. Her weight is stable. She is following a generally unhealthy diet. When asked about meal planning, she reported none. She has had a previous visit with a dietitian (has seen one in the past). She rarely participates in exercise. Her home blood glucose trend is decreasing rapidly. Her overall blood glucose range is 140-180 mg/dl. (She presents today with her CGM and logs showing drastic improvement in her glycemic profile overall.  Her previsit A1c was 7% on 9/14 at her PCP office, much improved from her previous A1c of 12.3%.  Analysis of her CGM shows TIR 62%, TAR 36%, TBR < 2%.  ) An ACE inhibitor/angiotensin II receptor blocker is contraindicated (reported allergy to ACE and ARBs). She does not see a podiatrist.Eye exam is current.  Hypertension This is a chronic problem. The current episode started more than 1 year ago. The problem has been gradually improving since onset. The problem is uncontrolled. Associated symptoms include malaise/fatigue and sweats. Pertinent negatives include no blurred vision, headaches or shortness of breath. There are no associated agents to hypertension. Risk factors for coronary artery disease include diabetes mellitus, dyslipidemia, family history, post-menopausal state and sedentary lifestyle. Past treatments include diuretics, beta blockers and alpha 1 blockers. The current treatment provides mild improvement.  Compliance problems include diet and exercise.  Hypertensive end-organ damage includes kidney disease and CAD/MI. Identifiable causes of hypertension include chronic renal disease.  Hyperlipidemia This is a chronic problem. The problem is uncontrolled. Recent lipid tests were reviewed and are high. Exacerbating diseases include chronic renal disease and diabetes. Factors aggravating her hyperlipidemia include beta blockers and fatty foods. Pertinent negatives include no shortness of breath. Current antihyperlipidemic treatment includes statins and fibric acid derivatives. The current treatment provides mild improvement of lipids. Compliance problems include adherence to diet and adherence to exercise.  Risk factors for coronary artery disease include diabetes mellitus, dyslipidemia, family history, hypertension, post-menopausal and a sedentary lifestyle.    Review of systems  Constitutional: + Minimally fluctuating body weight,  current Body mass index is 27 kg/m. , no fatigue, no subjective hyperthermia, no subjective hypothermia Eyes: no blurry vision, no xerophthalmia ENT: no sore throat, no nodules palpated in throat, no dysphagia/odynophagia, no hoarseness Cardiovascular: no chest pain, no shortness of breath, no palpitations, no leg swelling Respiratory: no cough, no shortness of breath Gastrointestinal: no nausea/vomiting/diarrhea Musculoskeletal: no muscle/joint aches Skin: no rashes, no hyperemia Neurological: no tremors, no numbness, no tingling, no dizziness Psychiatric: no depression, no anxiety  Objective:     BP (!) 157/84   Pulse 82   Ht '5\' 2"'  (1.575 m)   Wt 147 lb 9.6 oz (67  kg)   LMP  (LMP Unknown)   BMI 27.00 kg/m   Wt Readings from Last 3 Encounters:  08/03/21 147 lb 9.6 oz (67 kg)  07/19/21 148 lb 13 oz (67.5 kg)  06/01/21 148 lb 13 oz (67.5 kg)     BP Readings from Last 3 Encounters:  08/03/21 (!) 157/84  08/02/21 (!) 211/84  07/19/21 (!) 142/79       Physical Exam- Limited  Constitutional:  Body mass index is 27 kg/m. , not in acute distress, normal state of mind Eyes:  EOMI, no exophthalmos Neck: Supple Cardiovascular: RRR, no murmurs, rubs, or gallops, no edema Respiratory: Adequate breathing efforts, no crackles, rales, rhonchi, or wheezing Musculoskeletal: no gross deformities, strength intact in all four extremities, no gross restriction of joint movements Skin:  no rashes, no hyperemia Neurological: no tremor with outstretched hands    CMP ( most recent) CMP     Component Value Date/Time   NA 140 08/02/2021 1033   NA 143 06/17/2017 1220   K 3.2 (L) 08/02/2021 1033   CL 105 08/02/2021 1033   CO2 24 08/02/2021 1033   GLUCOSE 151 (H) 08/02/2021 1033   BUN 33 (H) 08/02/2021 1033   BUN 34 (H) 06/17/2017 1220   CREATININE 2.13 (H) 08/02/2021 1033   CALCIUM 9.0 08/02/2021 1033   CALCIUM 8.1 (L) 06/20/2021 0954   PROT 6.1 (L) 01/11/2021 0523   PROT 7.0 06/17/2017 1220   ALBUMIN 3.7 08/02/2021 1033   ALBUMIN 4.4 06/17/2017 1220   AST 23 01/11/2021 0523   ALT 16 01/11/2021 0523   ALKPHOS 60 01/11/2021 0523   BILITOT 0.2 (L) 01/11/2021 0523   BILITOT 0.3 06/17/2017 1220   GFRNONAA 25 (L) 08/02/2021 1033   GFRAA 56 (L) 05/19/2020 0640     Diabetic Labs (most recent): Lab Results  Component Value Date   HGBA1C 7.0 07/12/2021   HGBA1C 12.3 (A) 04/26/2021   HGBA1C 11.0 (H) 01/06/2021     Lipid Panel ( most recent) Lipid Panel     Component Value Date/Time   CHOL 449 (H) 05/04/2021 1149   CHOL 405 (H) 08/22/2020 1426   TRIG 617 (H) 05/04/2021 1149   HDL 49 05/04/2021 1149   HDL 30 (L) 08/22/2020 1426   CHOLHDL 9.2 05/04/2021 1149   VLDL UNABLE TO CALCULATE IF TRIGLYCERIDE OVER 400 mg/dL 05/04/2021 1149   LDLCALC UNABLE TO CALCULATE IF TRIGLYCERIDE OVER 400 mg/dL 05/04/2021 1149   LDLCALC Comment (A) 08/22/2020 1426   LDLDIRECT 117.9 (H) 05/04/2021 1150   LABVLDL Comment (A) 08/22/2020 1426      Lab  Results  Component Value Date   TSH 2.19 03/10/2020   TSH 0.78 08/06/2018   TSH 1.489 12/25/2016           Assessment & Plan:   1) Uncontrolled Type 2 Diabetes with nephropathy  She presents today with her CGM and logs showing drastic improvement in her glycemic profile overall.  Her previsit A1c was 7% on 9/14 at her PCP office, much improved from her previous A1c of 12.3%.  Analysis of her CGM shows TIR 62%, TAR 36%, TBR < 2%.    - Heather [name] has currently uncontrolled symptomatic type 2 DM since 70 years of age.  -Recent labs reviewed.  - I had a long discussion with her about the progressive nature of diabetes and the pathology behind its complications. -her diabetes is complicated by CKD, CAD, neuropathy, and pancreatitis and she remains at a high risk  for more acute and chronic complications which include CAD, CVA, CKD, retinopathy, and neuropathy. These are all discussed in detail with her.  - Nutritional counseling repeated at each appointment due to patients tendency to fall back in to old habits.  - The patient admits there is a room for improvement in their diet and drink choices. -  Suggestion is made for the patient to avoid simple carbohydrates from their diet including Cakes, Sweet Desserts / Pastries, Ice Cream, Soda (diet and regular), Sweet Tea, Candies, Chips, Cookies, Sweet Pastries, Store Bought Juices, Alcohol in Excess of 1-2 drinks a day, Artificial Sweeteners, Coffee Creamer, and "Sugar-free" Products. This will help patient to have stable blood glucose profile and potentially avoid unintended weight gain.   - I encouraged the patient to switch to unprocessed or minimally processed complex starch and increased protein intake (animal or plant source), fruits, and vegetables.   - Patient is advised to stick to a routine mealtimes to eat 3 meals a day and avoid unnecessary snacks (to snack only to correct hypoglycemia).  - I have approached her with the  following individualized plan to manage  her diabetes and patient agrees:   -Given her drastic improvement and tightening of her fasting glycemic profile, she will tolerate slight reduction in her Levemir to 70 units SQ nightly.  She can also lower her Humalog to 20-26 units TID with meals if glucose is above 90 and she is eating (Specific instructions on how to titrate insulin dosage based on glucose readings given to patient in writing).    -she is encouraged to continue monitoring glucose 4 times daily, before meals and before bed, and to call the clinic if she has readings less than 70 or greater than 300 for 3 tests in a row.  She is an excellent candidate for CGM device.  Placed order for Dexcom with [facility].  - she is warned not to take insulin without proper monitoring per orders. - Adjustment parameters are given to her for hypo and hyperglycemia in writing.  -She reportedly does not tolerate Metformin due to GI side effects. -She was on Ozempic (prescribed by another provider) but developed pancreatitis, therefore she is not a good candidate for incretin therapies.  - Specific targets for  A1c;  LDL, HDL,  and Triglycerides were discussed with the patient.  2) Blood Pressure /Hypertension:  her blood pressure is still not controlled to target but is much improved.  she is advised to continue her current medications including Lasix 40 mg po twice daily, Metoprolol 100 mg p.o. twice daily, and Clonidine 0.2 mg po BID.   3) Lipids/Hyperlipidemia:    Review of her recent lipid panel from 05/04/21 showed uncontrolled triglycerides of 614, LDL unable to be calculated .  she  is advised to continue Lipitor 80 mg po daily at bedtime, Lovaza 2 capsules twice daily, and Fenofibrate 145 mg daily.  Side effects and precautions discussed with her.  4)  Weight/Diet:  her Body mass index is 27 kg/m.  -   she is not a candidate for weight loss.  Exercise, and detailed carbohydrates information  provided  -  detailed on discharge instructions.  5) Chronic Care/Health Maintenance: -she is on Statin medications and is encouraged to initiate and continue to follow up with Ophthalmology, Dentist, Podiatrist at least yearly or according to recommendations, and advised to stay away from smoking. I have recommended yearly flu vaccine and pneumonia vaccine at least every 5 years; moderate intensity exercise for  up to 150 minutes weekly; and sleep for at least 7 hours a day.  - she is advised to maintain close follow up with [name], MD for primary care needs, as well as her other providers for optimal and coordinated care.        I spent 43 minutes in the care of the patient today including review of labs from [place], Lipids, Thyroid Function, Hematology (current and previous including abstractions from other facilities); face-to-face time discussing  her blood glucose readings/logs, discussing hypoglycemia and hyperglycemia episodes and symptoms, medications doses, her options of short and long term treatment based on the latest standards of care / guidelines;  discussion about incorporating lifestyle medicine;  and documenting the encounter.    Please refer to Patient Instructions for Blood Glucose Monitoring and Insulin/Medications Dosing Guide"  in media tab for additional information. Please  also refer to " Patient Self Inventory" in the Media  tab for reviewed elements of pertinent patient history.  Heather [name] participated in the discussions, expressed understanding, and voiced agreement with the above plans.  All questions were answered to her satisfaction. she is encouraged to contact clinic should she have any questions or concerns prior to her return visit.   Follow up plan: - Return in about 4 months (around 12/04/2021) for Diabetes F/U with A1c in office, No previsit labs, Bring meter and logs.  Rayetta Pigg, [facility] [address] [place], [place] [address] Phone: [phone] Fax: [phone]  08/03/2021, 9:58 AM

## 2021-08-13 ENCOUNTER — Other Ambulatory Visit: Payer: Self-pay | Admitting: Internal Medicine

## 2021-08-13 DIAGNOSIS — K50018 Crohn's disease of small intestine with other complication: Secondary | ICD-10-CM

## 2021-08-14 NOTE — Telephone Encounter (Signed)
She is overdue for follow-up  Please do the following:  Contact the patient and find out if she is actually taking the Humira and if she has been taking it we can continue it but that is contingent upon her making a follow-up visit next available  We can refill it for 3 months once she has scheduled a follow-up appointment

## 2021-08-15 ENCOUNTER — Other Ambulatory Visit: Payer: Self-pay

## 2021-08-15 MED ORDER — HUMIRA (2 PEN) 40 MG/0.4ML ~~LOC~~ AJKT: AUTO-INJECTOR | SUBCUTANEOUS | 3 refills | Status: DC

## 2021-08-15 NOTE — Telephone Encounter (Signed)
Pt scheduled for an OV with Dr. Carlean Purl 10/12/21 @ 2:10. Pt made aware. Pt verbalized understanding with all questions answered.  Prescription placed for Humira for 3 months Supply.

## 2021-08-16 ENCOUNTER — Inpatient Hospital Stay (HOSPITAL_COMMUNITY)
Admission: EM | Admit: 2021-08-16 | Discharge: 2021-09-06 | DRG: 682 | Disposition: A | Discharge status: Home Health | Payer: 59 | Attending: Internal Medicine | Admitting: Internal Medicine

## 2021-08-16 ENCOUNTER — Encounter (HOSPITAL_COMMUNITY): Payer: Self-pay

## 2021-08-16 ENCOUNTER — Encounter (HOSPITAL_COMMUNITY)
Admission: RE | Admit: 2021-08-16 | Discharge: 2021-08-16 | Disposition: A | Discharge status: Home / Self Care | Payer: 59 | Source: Ambulatory Visit | Attending: Nephrology | Admitting: Nephrology

## 2021-08-16 ENCOUNTER — Other Ambulatory Visit: Payer: Self-pay

## 2021-08-16 ENCOUNTER — Emergency Department (HOSPITAL_COMMUNITY): Payer: 59

## 2021-08-16 DIAGNOSIS — E1165 Type 2 diabetes mellitus with hyperglycemia: Secondary | ICD-10-CM | POA: Diagnosis present

## 2021-08-16 DIAGNOSIS — I63442 Cerebral infarction due to embolism of left cerebellar artery: Secondary | ICD-10-CM | POA: Diagnosis not present

## 2021-08-16 DIAGNOSIS — D631 Anemia in chronic kidney disease: Secondary | ICD-10-CM | POA: Diagnosis present

## 2021-08-16 DIAGNOSIS — K50018 Crohn's disease of small intestine with other complication: Secondary | ICD-10-CM | POA: Diagnosis present

## 2021-08-16 DIAGNOSIS — E782 Mixed hyperlipidemia: Secondary | ICD-10-CM | POA: Diagnosis present

## 2021-08-16 DIAGNOSIS — E871 Hypo-osmolality and hyponatremia: Secondary | ICD-10-CM | POA: Diagnosis present

## 2021-08-16 DIAGNOSIS — R32 Unspecified urinary incontinence: Secondary | ICD-10-CM | POA: Diagnosis present

## 2021-08-16 DIAGNOSIS — D6489 Other specified anemias: Secondary | ICD-10-CM | POA: Diagnosis present

## 2021-08-16 DIAGNOSIS — R0902 Hypoxemia: Secondary | ICD-10-CM

## 2021-08-16 DIAGNOSIS — M4323 Fusion of spine, cervicothoracic region: Secondary | ICD-10-CM | POA: Diagnosis present

## 2021-08-16 DIAGNOSIS — Z79899 Other long term (current) drug therapy: Secondary | ICD-10-CM

## 2021-08-16 DIAGNOSIS — F05 Delirium due to known physiological condition: Secondary | ICD-10-CM | POA: Diagnosis not present

## 2021-08-16 DIAGNOSIS — E872 Acidosis, unspecified: Secondary | ICD-10-CM | POA: Diagnosis present

## 2021-08-16 DIAGNOSIS — G4700 Insomnia, unspecified: Secondary | ICD-10-CM | POA: Diagnosis present

## 2021-08-16 DIAGNOSIS — E11649 Type 2 diabetes mellitus with hypoglycemia without coma: Secondary | ICD-10-CM | POA: Diagnosis present

## 2021-08-16 DIAGNOSIS — Z981 Arthrodesis status: Secondary | ICD-10-CM

## 2021-08-16 DIAGNOSIS — N179 Acute kidney failure, unspecified: Principal | ICD-10-CM

## 2021-08-16 DIAGNOSIS — I161 Hypertensive emergency: Secondary | ICD-10-CM | POA: Diagnosis present

## 2021-08-16 DIAGNOSIS — E876 Hypokalemia: Secondary | ICD-10-CM

## 2021-08-16 DIAGNOSIS — G9341 Metabolic encephalopathy: Secondary | ICD-10-CM | POA: Diagnosis present

## 2021-08-16 DIAGNOSIS — I130 Hypertensive heart and chronic kidney disease with heart failure and stage 1 through stage 4 chronic kidney disease, or unspecified chronic kidney disease: Secondary | ICD-10-CM | POA: Diagnosis present

## 2021-08-16 DIAGNOSIS — H538 Other visual disturbances: Secondary | ICD-10-CM

## 2021-08-16 DIAGNOSIS — R519 Headache, unspecified: Secondary | ICD-10-CM

## 2021-08-16 DIAGNOSIS — I959 Hypotension, unspecified: Secondary | ICD-10-CM | POA: Diagnosis not present

## 2021-08-16 DIAGNOSIS — E1122 Type 2 diabetes mellitus with diabetic chronic kidney disease: Secondary | ICD-10-CM | POA: Diagnosis present

## 2021-08-16 DIAGNOSIS — Z20822 Contact with and (suspected) exposure to covid-19: Secondary | ICD-10-CM | POA: Diagnosis present

## 2021-08-16 DIAGNOSIS — F32A Depression, unspecified: Secondary | ICD-10-CM | POA: Diagnosis present

## 2021-08-16 DIAGNOSIS — G928 Other toxic encephalopathy: Secondary | ICD-10-CM | POA: Diagnosis not present

## 2021-08-16 DIAGNOSIS — L8990 Pressure ulcer of unspecified site, unspecified stage: Secondary | ICD-10-CM | POA: Insufficient documentation

## 2021-08-16 DIAGNOSIS — R21 Rash and other nonspecific skin eruption: Secondary | ICD-10-CM | POA: Diagnosis present

## 2021-08-16 DIAGNOSIS — E1140 Type 2 diabetes mellitus with diabetic neuropathy, unspecified: Secondary | ICD-10-CM | POA: Diagnosis present

## 2021-08-16 DIAGNOSIS — I5033 Acute on chronic diastolic (congestive) heart failure: Secondary | ICD-10-CM | POA: Diagnosis present

## 2021-08-16 DIAGNOSIS — R918 Other nonspecific abnormal finding of lung field: Secondary | ICD-10-CM

## 2021-08-16 DIAGNOSIS — E538 Deficiency of other specified B group vitamins: Secondary | ICD-10-CM | POA: Diagnosis present

## 2021-08-16 DIAGNOSIS — Z992 Dependence on renal dialysis: Secondary | ICD-10-CM

## 2021-08-16 DIAGNOSIS — E1121 Type 2 diabetes mellitus with diabetic nephropathy: Secondary | ICD-10-CM | POA: Diagnosis present

## 2021-08-16 DIAGNOSIS — I6330 Cerebral infarction due to thrombosis of unspecified cerebral artery: Secondary | ICD-10-CM | POA: Insufficient documentation

## 2021-08-16 DIAGNOSIS — Z90710 Acquired absence of both cervix and uterus: Secondary | ICD-10-CM

## 2021-08-16 DIAGNOSIS — I10 Essential (primary) hypertension: Secondary | ICD-10-CM | POA: Diagnosis not present

## 2021-08-16 DIAGNOSIS — E875 Hyperkalemia: Secondary | ICD-10-CM | POA: Diagnosis not present

## 2021-08-16 DIAGNOSIS — Z794 Long term (current) use of insulin: Secondary | ICD-10-CM

## 2021-08-16 DIAGNOSIS — N1832 Chronic kidney disease, stage 3b: Secondary | ICD-10-CM | POA: Diagnosis present

## 2021-08-16 DIAGNOSIS — Z800 Family history of malignant neoplasm of digestive organs: Secondary | ICD-10-CM

## 2021-08-16 DIAGNOSIS — K5000 Crohn's disease of small intestine without complications: Secondary | ICD-10-CM | POA: Diagnosis present

## 2021-08-16 DIAGNOSIS — I6340 Cerebral infarction due to embolism of unspecified cerebral artery: Secondary | ICD-10-CM

## 2021-08-16 DIAGNOSIS — Z66 Do not resuscitate: Secondary | ICD-10-CM | POA: Diagnosis present

## 2021-08-16 DIAGNOSIS — D696 Thrombocytopenia, unspecified: Secondary | ICD-10-CM | POA: Diagnosis not present

## 2021-08-16 DIAGNOSIS — N189 Chronic kidney disease, unspecified: Secondary | ICD-10-CM | POA: Diagnosis present

## 2021-08-16 DIAGNOSIS — E861 Hypovolemia: Secondary | ICD-10-CM | POA: Diagnosis present

## 2021-08-16 DIAGNOSIS — J189 Pneumonia, unspecified organism: Secondary | ICD-10-CM | POA: Diagnosis present

## 2021-08-16 DIAGNOSIS — Z8673 Personal history of transient ischemic attack (TIA), and cerebral infarction without residual deficits: Secondary | ICD-10-CM

## 2021-08-16 DIAGNOSIS — J9601 Acute respiratory failure with hypoxia: Secondary | ICD-10-CM | POA: Diagnosis present

## 2021-08-16 DIAGNOSIS — K219 Gastro-esophageal reflux disease without esophagitis: Secondary | ICD-10-CM | POA: Diagnosis present

## 2021-08-16 DIAGNOSIS — Z79620 Long term (current) use of immunosuppressive biologic: Secondary | ICD-10-CM

## 2021-08-16 DIAGNOSIS — L89311 Pressure ulcer of right buttock, stage 1: Secondary | ICD-10-CM | POA: Diagnosis present

## 2021-08-16 DIAGNOSIS — E162 Hypoglycemia, unspecified: Secondary | ICD-10-CM

## 2021-08-16 DIAGNOSIS — L89321 Pressure ulcer of left buttock, stage 1: Secondary | ICD-10-CM | POA: Diagnosis present

## 2021-08-16 DIAGNOSIS — R0602 Shortness of breath: Secondary | ICD-10-CM

## 2021-08-16 DIAGNOSIS — Z781 Physical restraint status: Secondary | ICD-10-CM

## 2021-08-16 DIAGNOSIS — R339 Retention of urine, unspecified: Secondary | ICD-10-CM | POA: Diagnosis present

## 2021-08-16 DIAGNOSIS — Z833 Family history of diabetes mellitus: Secondary | ICD-10-CM

## 2021-08-16 DIAGNOSIS — D496 Neoplasm of unspecified behavior of brain: Secondary | ICD-10-CM | POA: Diagnosis present

## 2021-08-16 LAB — CBC WITH DIFFERENTIAL/PLATELET
Abs Immature Granulocytes: 0.12 10*3/uL — ABNORMAL HIGH (ref 0.00–0.07)
Basophils Absolute: 0.1 10*3/uL (ref 0.0–0.1)
Basophils Relative: 1 %
Eosinophils Absolute: 0.2 10*3/uL (ref 0.0–0.5)
Eosinophils Relative: 2 %
HCT: 30.4 % — ABNORMAL LOW (ref 36.0–46.0)
Hemoglobin: 9.7 g/dL — ABNORMAL LOW (ref 12.0–15.0)
Immature Granulocytes: 1 %
Lymphocytes Relative: 18 %
Lymphs Abs: 1.6 10*3/uL (ref 0.7–4.0)
MCH: 30.6 pg (ref 26.0–34.0)
MCHC: 31.9 g/dL (ref 30.0–36.0)
MCV: 95.9 fL (ref 80.0–100.0)
Monocytes Absolute: 0.7 10*3/uL (ref 0.1–1.0)
Monocytes Relative: 7 %
Neutro Abs: 6.6 10*3/uL (ref 1.7–7.7)
Neutrophils Relative %: 71 %
Platelets: 216 10*3/uL (ref 150–400)
RBC: 3.17 MIL/uL — ABNORMAL LOW (ref 3.87–5.11)
RDW: 15.2 % (ref 11.5–15.5)
WBC: 9.4 10*3/uL (ref 4.0–10.5)
nRBC: 0 % (ref 0.0–0.2)

## 2021-08-16 LAB — COMPREHENSIVE METABOLIC PANEL
ALT: 17 U/L (ref 0–44)
AST: 21 U/L (ref 15–41)
Albumin: 3.4 g/dL — ABNORMAL LOW (ref 3.5–5.0)
Alkaline Phosphatase: 57 U/L (ref 38–126)
Anion gap: 12 (ref 5–15)
BUN: 56 mg/dL — ABNORMAL HIGH (ref 8–23)
CO2: 21 mmol/L — ABNORMAL LOW (ref 22–32)
Calcium: 6.5 mg/dL — ABNORMAL LOW (ref 8.9–10.3)
Chloride: 106 mmol/L (ref 98–111)
Creatinine, Ser: 3.23 mg/dL — ABNORMAL HIGH (ref 0.44–1.00)
GFR, Estimated: 15 mL/min — ABNORMAL LOW (ref >60)
Glucose, Bld: 43 mg/dL — CL (ref 70–99)
Potassium: 3.1 mmol/L — ABNORMAL LOW (ref 3.5–5.1)
Sodium: 139 mmol/L (ref 135–145)
Total Bilirubin: 1 mg/dL (ref 0.3–1.2)
Total Protein: 6.9 g/dL (ref 6.5–8.1)

## 2021-08-16 LAB — GLUCOSE, CAPILLARY: Glucose-Capillary: 193 mg/dL — ABNORMAL HIGH (ref 70–99)

## 2021-08-16 LAB — CBG MONITORING, ED
Glucose-Capillary: 89 mg/dL (ref 70–99)
Glucose-Capillary: 126 mg/dL — ABNORMAL HIGH (ref 70–99)
Glucose-Capillary: 100 mg/dL — ABNORMAL HIGH (ref 70–99)

## 2021-08-16 LAB — RESP PANEL BY RT-PCR (FLU A&B, COVID) ARPGX2
Influenza A by PCR: NEGATIVE
Influenza B by PCR: NEGATIVE
SARS Coronavirus 2 by RT PCR: NEGATIVE

## 2021-08-16 LAB — MAGNESIUM: Magnesium: 0.7 mg/dL — CL (ref 1.7–2.4)

## 2021-08-16 MED ORDER — INSULIN ASPART 100 UNIT/ML IJ SOLN
0.0000 [IU] | Freq: Three times a day (TID) | INTRAMUSCULAR | Status: DC
  Administered 2021-08-17 (×2): 2 [IU] via SUBCUTANEOUS
  Administered 2021-08-17: 3 [IU] via SUBCUTANEOUS
  Administered 2021-08-18 – 2021-08-19 (×2): 1 [IU] via SUBCUTANEOUS
  Administered 2021-08-20 (×2): 2 [IU] via SUBCUTANEOUS
  Administered 2021-08-21 (×2): 3 [IU] via SUBCUTANEOUS
  Administered 2021-08-21 – 2021-08-24 (×2): 1 [IU] via SUBCUTANEOUS
  Administered 2021-08-25: 2 [IU] via SUBCUTANEOUS
  Administered 2021-08-25 (×2): 1 [IU] via SUBCUTANEOUS
  Administered 2021-08-26 (×2): 3 [IU] via SUBCUTANEOUS
  Administered 2021-08-27: 1 [IU] via SUBCUTANEOUS
  Administered 2021-08-27 – 2021-08-28 (×3): 2 [IU] via SUBCUTANEOUS
  Administered 2021-08-29: 3 [IU] via SUBCUTANEOUS
  Administered 2021-08-29: 2 [IU] via SUBCUTANEOUS
  Administered 2021-08-29 – 2021-08-30 (×3): 3 [IU] via SUBCUTANEOUS
  Administered 2021-08-30: 2 [IU] via SUBCUTANEOUS
  Administered 2021-08-31: 3 [IU] via SUBCUTANEOUS
  Administered 2021-08-31: 2 [IU] via SUBCUTANEOUS
  Administered 2021-08-31: 5 [IU] via SUBCUTANEOUS
  Administered 2021-09-01 – 2021-09-02 (×3): 2 [IU] via SUBCUTANEOUS
  Administered 2021-09-02: 5 [IU] via SUBCUTANEOUS
  Administered 2021-09-02 – 2021-09-03 (×2): 3 [IU] via SUBCUTANEOUS
  Administered 2021-09-03: 7 [IU] via SUBCUTANEOUS
  Administered 2021-09-04: 5 [IU] via SUBCUTANEOUS
  Administered 2021-09-04: 2 [IU] via SUBCUTANEOUS
  Administered 2021-09-04: 1 [IU] via SUBCUTANEOUS
  Administered 2021-09-05: 2 [IU] via SUBCUTANEOUS
  Administered 2021-09-05: 3 [IU] via SUBCUTANEOUS
  Administered 2021-09-06 (×2): 1 [IU] via SUBCUTANEOUS

## 2021-08-16 MED ORDER — HYDRALAZINE HCL 25 MG PO TABS
25.0000 mg | ORAL_TABLET | Freq: Two times a day (BID) | ORAL | Status: DC
  Administered 2021-08-16 – 2021-08-17 (×3): 25 mg via ORAL
  Filled 2021-08-16 (×4): qty 1

## 2021-08-16 MED ORDER — MAGNESIUM SULFATE 4 GM/100ML IV SOLN
4.0000 g | Freq: Once | INTRAVENOUS | Status: AC
  Administered 2021-08-16: 4 g via INTRAVENOUS
  Filled 2021-08-16: qty 100

## 2021-08-16 MED ORDER — SODIUM CHLORIDE 0.9 % IV SOLN: INTRAVENOUS | Status: AC

## 2021-08-16 MED ORDER — CITALOPRAM HYDROBROMIDE 20 MG PO TABS
20.0000 mg | ORAL_TABLET | Freq: Every day | ORAL | Status: DC
  Administered 2021-08-17 – 2021-08-22 (×5): 20 mg via ORAL
  Filled 2021-08-16 (×6): qty 1

## 2021-08-16 MED ORDER — PANTOPRAZOLE SODIUM 40 MG PO TBEC
40.0000 mg | DELAYED_RELEASE_TABLET | Freq: Every day | ORAL | Status: DC
  Administered 2021-08-17 – 2021-09-06 (×18): 40 mg via ORAL
  Filled 2021-08-16 (×20): qty 1

## 2021-08-16 MED ORDER — PANCRELIPASE (LIP-PROT-AMYL) 12000-38000 UNITS PO CPEP
36000.0000 [IU] | ORAL_CAPSULE | ORAL | Status: DC
  Administered 2021-08-17: 36000 [IU] via ORAL
  Administered 2021-08-18: 24000 [IU] via ORAL
  Filled 2021-08-16 (×2): qty 3

## 2021-08-16 MED ORDER — EPOETIN ALFA-EPBX 3000 UNIT/ML IJ SOLN: 3000.0000 [IU] | Freq: Once | INTRAMUSCULAR | Status: DC

## 2021-08-16 MED ORDER — LABETALOL HCL 5 MG/ML IV SOLN
20.0000 mg | Freq: Once | INTRAVENOUS | Status: AC
  Administered 2021-08-16: 20 mg via INTRAVENOUS
  Filled 2021-08-16: qty 4

## 2021-08-16 MED ORDER — CALCIUM GLUCONATE-NACL 1-0.675 GM/50ML-% IV SOLN
1.0000 g | Freq: Once | INTRAVENOUS | Status: AC
  Administered 2021-08-16: 1000 mg via INTRAVENOUS
  Filled 2021-08-16: qty 50

## 2021-08-16 MED ORDER — METOCLOPRAMIDE HCL 5 MG/ML IJ SOLN
10.0000 mg | Freq: Once | INTRAMUSCULAR | Status: AC
  Administered 2021-08-16: 10 mg via INTRAVENOUS
  Filled 2021-08-16: qty 2

## 2021-08-16 MED ORDER — ONDANSETRON HCL 4 MG/2ML IJ SOLN
4.0000 mg | Freq: Once | INTRAMUSCULAR | Status: AC
  Administered 2021-08-16: 4 mg via INTRAVENOUS
  Filled 2021-08-16: qty 2

## 2021-08-16 MED ORDER — HYDROMORPHONE HCL 1 MG/ML IJ SOLN
1.0000 mg | Freq: Once | INTRAMUSCULAR | Status: AC
  Administered 2021-08-16: 1 mg via INTRAVENOUS
  Filled 2021-08-16: qty 1

## 2021-08-16 MED ORDER — ONDANSETRON HCL 4 MG PO TABS
4.0000 mg | ORAL_TABLET | Freq: Four times a day (QID) | ORAL | Status: DC | PRN
  Administered 2021-08-18 – 2021-08-19 (×3): 4 mg via ORAL
  Filled 2021-08-16 (×3): qty 1

## 2021-08-16 MED ORDER — POTASSIUM CHLORIDE CRYS ER 20 MEQ PO TBCR
40.0000 meq | EXTENDED_RELEASE_TABLET | Freq: Once | ORAL | Status: AC
  Administered 2021-08-16: 40 meq via ORAL
  Filled 2021-08-16: qty 2

## 2021-08-16 MED ORDER — METOPROLOL TARTRATE 50 MG PO TABS
100.0000 mg | ORAL_TABLET | Freq: Two times a day (BID) | ORAL | Status: DC
  Administered 2021-08-16 – 2021-08-24 (×12): 100 mg via ORAL
  Filled 2021-08-16 (×14): qty 2

## 2021-08-16 MED ORDER — MORPHINE SULFATE (PF) 4 MG/ML IV SOLN
4.0000 mg | Freq: Once | INTRAVENOUS | Status: AC
  Administered 2021-08-16: 4 mg via INTRAVENOUS
  Filled 2021-08-16: qty 1

## 2021-08-16 MED ORDER — VENLAFAXINE HCL ER 75 MG PO CP24
150.0000 mg | ORAL_CAPSULE | Freq: Every day | ORAL | Status: DC
  Administered 2021-08-17 – 2021-08-22 (×5): 150 mg via ORAL
  Filled 2021-08-16 (×6): qty 2

## 2021-08-16 MED ORDER — HEPARIN SODIUM (PORCINE) 5000 UNIT/ML IJ SOLN
5000.0000 [IU] | Freq: Three times a day (TID) | INTRAMUSCULAR | Status: DC
  Administered 2021-08-16 – 2021-08-19 (×8): 5000 [IU] via SUBCUTANEOUS
  Filled 2021-08-16 (×8): qty 1

## 2021-08-16 MED ORDER — SODIUM CHLORIDE 0.9 % IV BOLUS
1000.0000 mL | Freq: Once | INTRAVENOUS | Status: AC
  Administered 2021-08-16: 1000 mL via INTRAVENOUS

## 2021-08-16 MED ORDER — ALLOPURINOL 100 MG PO TABS
100.0000 mg | ORAL_TABLET | Freq: Every day | ORAL | Status: DC
  Administered 2021-08-17 – 2021-09-06 (×18): 100 mg via ORAL
  Filled 2021-08-16 (×20): qty 1

## 2021-08-16 MED ORDER — MAGNESIUM SULFATE 2 GM/50ML IV SOLN: 2.0000 g | Freq: Once | INTRAVENOUS | Status: DC

## 2021-08-16 MED ORDER — ATORVASTATIN CALCIUM 40 MG PO TABS
80.0000 mg | ORAL_TABLET | Freq: Every morning | ORAL | Status: DC
  Administered 2021-08-17 – 2021-08-18 (×2): 80 mg via ORAL
  Filled 2021-08-16 (×2): qty 2

## 2021-08-16 MED ORDER — DEXTROSE 50 % IV SOLN
INTRAVENOUS | Status: AC
  Filled 2021-08-16: qty 50

## 2021-08-16 MED ORDER — CLONIDINE HCL 0.2 MG PO TABS
0.2000 mg | ORAL_TABLET | Freq: Two times a day (BID) | ORAL | Status: DC
  Administered 2021-08-16 – 2021-08-17 (×3): 0.2 mg via ORAL
  Filled 2021-08-16 (×4): qty 1

## 2021-08-16 MED ORDER — POTASSIUM CHLORIDE CRYS ER 20 MEQ PO TBCR
20.0000 meq | EXTENDED_RELEASE_TABLET | Freq: Every morning | ORAL | Status: DC
  Administered 2021-08-17: 20 meq via ORAL
  Filled 2021-08-16: qty 1

## 2021-08-16 MED ORDER — TEMAZEPAM 15 MG PO CAPS
15.0000 mg | ORAL_CAPSULE | Freq: Every evening | ORAL | Status: DC | PRN
  Administered 2021-08-16 – 2021-08-31 (×9): 15 mg via ORAL
  Filled 2021-08-16 (×5): qty 1
  Filled 2021-08-16 (×2): qty 2
  Filled 2021-08-16 (×2): qty 1

## 2021-08-16 MED ORDER — HYDROMORPHONE HCL 1 MG/ML IJ SOLN
0.5000 mg | Freq: Once | INTRAMUSCULAR | Status: AC
Start: 2021-08-16 — End: 2021-08-16
  Administered 2021-08-16: 0.5 mg via INTRAVENOUS
  Filled 2021-08-16: qty 1

## 2021-08-16 MED ORDER — ONDANSETRON HCL 4 MG/2ML IJ SOLN
4.0000 mg | Freq: Four times a day (QID) | INTRAMUSCULAR | Status: DC | PRN
  Administered 2021-08-19: 4 mg via INTRAVENOUS
  Filled 2021-08-16 (×2): qty 2

## 2021-08-16 NOTE — Progress Notes (Signed)
Arrived for Hgb check and retacrit injection. C/O pain in back of head x 2 days. States pain as throbbing and sharp. Rates pain 7. C/O blurred vision x 2days. States she drove herself to hospital. C/O nausea. No emesis. BP 169/77.

## 2021-08-16 NOTE — ED Notes (Signed)
Patient transported to MRI 

## 2021-08-16 NOTE — ED Triage Notes (Signed)
Pt here from clinic, was here for "a shot that helps her kidneys". States blurry vision x 2 days that has gotten worse over night. Drove herself here. Also c/o occipital head pain that radiates down back, has been told from previous MRI results that she has a tumor there. No slurred speech.

## 2021-08-16 NOTE — ED Notes (Signed)
Date and time results received: 08/16/21 1244   Test:blood glucose Critical Value: 19  Name of Provider Notified: [facility]  Orders Received? Or Actions Taken?: amp of D50

## 2021-08-16 NOTE — Progress Notes (Signed)
Talked with Thayer Headings at Dr Toya Smothers office. Pt conditiondiscussed. Instructed to hold retacrit and  take pt to ER. Report called to [facility] in ER. Pt transferred to ER via w/c in good condition.

## 2021-08-16 NOTE — H&P (Addendum)
History and Physical    Heather [name] IOX:[id number] DOB: 02-19-1951 DOA: 08/16/2021  PCP: [name], Heather [name]   Patient coming from: Home  I have personally briefly reviewed patient's old medical records in [place]  Chief Complaint: Blurry Vision  HPI: Heather [name] is a 70 y.o. female with medical history significant for CKD 3, Crohn's disease, hypertension, diabetes mellitus, BPPV stroke. Patient presented to the ED with complaints of blurred vision of 2 days duration.  She reports associated stabbing headache at the back of her head.  Symptoms have remained persistent with same intensity since onset.  No facial asymmetry, no voice change, no weakness of extremities.  She reports similar occurrence 5 to 10 years ago, she was seen at [facility], she reports symptoms lasted about 2 days then self resolved. No vomiting, she reports baseline 5-6 bowel movements daily related to her IBD and Crohn's.   Reports compliance with her blood pressure medications metoprolol and clonidine 0.2 mg twice every day.  Reports her blood pressure is always elevated, up to 268 systolic.  Patient reports recent lower abdominal pressure for which she was diagnosed with a UTI and treated with a medication to start with  "D".  Symptoms have since resolved.  But while taking the medication she developed a diffuse rash.  ED Course: Temperature 98.3.  Heart rate 70s to 95, respiratory rate 12-20.  Blood pressure systolic ranging from 341- 210.  Patient was given labetalol in the ED, she had received only 5 mg on the 20mg  ordered when blood pressure dropped to 107/93. Neurology was consulted, MRI brain.  Headache likely multifactorial secondary to blood pressure, metabolic abnormalities, vessel imaging with MRA MRV recommended to rule out dangerous etiologies. Iv 4 mg morphine, 0.5 mg Dilaudid, Reglan 10 mg, Zofran 4 mg given with persistent headache. Creatinine elevated 3.23.  Potassium 3.1. Hospitalist  admit for AKI, pleural effusion uncontrolled blood pressure.  Review of Systems: As per HPI all other systems reviewed and negative.  Past Medical History:  Diagnosis Date   Acute renal failure superimposed on stage 3 chronic kidney disease ([place]) 01/21/2016   Anemia    Anxiety    B12 deficiency    Brain tumor (benign) ([place]) 10/29/2020   Cellulitis and abscess    Abdomen and buttocks   CKD (chronic kidney disease) stage 3, GFR 30-59 ml/min (HCC)    Closed fracture of distal end of femur, unspecified fracture morphology, initial encounter ([place])    Depression    Diabetic neuropathy (HCC)    feet   DJD (degenerative joint disease)    Right forminal stenosis C4-5   Essential hypertension    Femur fracture, right ([place]) 07/04/2228   Folliculitis    GERD (gastroesophageal reflux disease)    Gout    Headache(784.0)    Hiatal hernia    History of blood transfusion    History of bronchitis    History of cardiac catheterization    No significant CAD 2012   History of kidney stones    surgery to remove   Insomnia    Irritable bowel syndrome    Mixed hyperlipidemia    Osteoarthritis    Palpitations    Pneumonia    Reflux esophagitis    Salmonella    Stroke [facility])    Tinnitus    Right   Type 2 diabetes mellitus ([place])     Past Surgical History:  Procedure Laterality Date   BACK SURGERY     c-spine surgery  03/2007   CARDIAC CATHETERIZATION     CHOLECYSTECTOMY     COLONOSCOPY  04/21/2011; 05/29/11   6/12: [place] - ?AVM at IC valve, inflammatory changes at IC valve [facility]); 7/12 - [place]; IC valve erosions, look chronic and probably Crohn's per path   colonoscopy  2017   [place]: random biopsies normal. TI normal   ESOPHAGOGASTRODUODENOSCOPY  05/29/11   normal   ESOPHAGOGASTRODUODENOSCOPY N/A 01/21/2016   Dr. Michail Sermon: normal   EYE SURGERY Bilateral    cataracts removed   FEMUR IM NAIL Right 04/10/2018   Procedure: INTRAMEDULLARY (IM) NAIL FEMORAL;  Surgeon:  Paralee Cancel, Heather [name];  Location: [facility];  Service: Orthopedics;  Laterality: Right;   FRACTURE SURGERY     GIVENS CAPSULE STUDY  11/17/2020   HARDWARE REMOVAL Right 04/10/2018   Procedure: HARDWARE REMOVAL RIGHT DISTAL FEMUR;  Surgeon: Paralee Cancel, Heather [name];  Location: [facility];  Service: Orthopedics;  Laterality: Right;   HERNIA REPAIR     IR FLUORO GUIDE CV LINE RIGHT  04/01/2017   IR US GUIDE VASC ACCESS RIGHT  04/01/2017   JOINT REPLACEMENT Right    hip   OPEN REDUCTION INTERNAL FIXATION (ORIF) DISTAL RADIAL FRACTURE Right 05/28/2019   Procedure: OPEN REDUCTION INTERNAL FIXATION (ORIF) DISTAL RADIAL FRACTURE;  Surgeon: Verner Mould, Heather [name];  Location: [place];  Service: Orthopedics;  Laterality: Right;  with block 90 minutes   ORIF FEMUR FRACTURE Right 03/31/2017   Procedure: OPEN REDUCTION INTERNAL FIXATION (ORIF) DISTAL FEMUR FRACTURE;  Surgeon: Paralee Cancel, Heather [name];  Location: [place];  Service: Orthopedics;  Laterality: Right;   removal of kidney stone     Right knee arthroscopic surgery     TONSILLECTOMY     TOTAL ABDOMINAL HYSTERECTOMY       reports that she has never smoked. She has never used smokeless tobacco. She reports that she does not drink alcohol and does not use drugs.  Allergies  Allergen Reactions   Duloxetine Hcl Swelling   Fluocinolone Swelling   Acetaminophen Itching and Swelling    Pt states "some swelling"   Amlodipine Besylate Swelling    Swelling of feet, fluid retention   Ciprofloxacin Nausea And Vomiting and Swelling    Swelling of face, jaw, and lips   Hydrocodone-Acetaminophen Itching   Metformin Other (See Comments)    REACTION: GI UPSET   Nsaids Other (See Comments)    "HAS BLEEDING ULCERS"   Quinolones Swelling   Sulfamethoxazole Swelling    Swelling of feet, legs   Valsartan Swelling   Cozaar [Losartan] Swelling and Rash   Lisinopril Swelling and Rash    Oral swelling, and red streaks on arms/stomach   Tape Itching and Rash    Paper tape can only be  used on this patient    Family History  Problem Relation Age of Onset   Diabetes Mother    Colon cancer Brother        POSSIBLY. Patient states diagnosed at age 2, then later states age 47. unclear and limited historian   Esophageal cancer Neg Hx    Rectal cancer Neg Hx    Stomach cancer Neg Hx     Prior to Admission medications   Medication Sig Start Date End Date Taking? Authorizing Provider  acetaminophen (TYLENOL) 650 MG CR tablet Take 650 mg by mouth every 8 (eight) hours as needed for pain.   Yes Provider, Historical, Heather [name]  Adalimumab (HUMIRA PEN) 40 MG/0.4ML PNKT Inject 40 mg into the skin every 14 (  fourteen) days. 08/15/21  Yes Gatha Mayer, Heather [name]  albuterol (VENTOLIN HFA) 108 (90 Base) MCG/ACT inhaler Inhale 1-2 puffs into the lungs every 6 (six) hours as needed for shortness of breath or wheezing. 02/16/21 02/16/22 Yes Provider, Historical, Heather [name]  allopurinol (ZYLOPRIM) 100 MG tablet Take 100 mg by mouth daily. 03/20/21  Yes Provider, Historical, Heather [name]  Ascorbic Acid (VITAMIN C WITH ROSE HIPS) 500 MG tablet Take 500 mg by mouth every morning.   Yes Provider, Historical, Heather [name]  atorvastatin (LIPITOR) 80 MG tablet Take 80 mg by mouth every morning.   Yes Provider, Historical, Heather [name]  Cholecalciferol (VITAMIN D) 2000 units CAPS Take 2,000 Units by mouth every morning.    Yes Provider, Historical, Heather [name]  citalopram (CELEXA) 20 MG tablet Take 1 tablet (20 mg total) by mouth daily. 01/12/21  Yes Barton Dubois, Heather [name]  cloNIDine (CATAPRES) 0.2 MG tablet Take 0.2 mg by mouth 2 (two) times daily.   Yes Provider, Historical, Heather [name]  cyanocobalamin (,VITAMIN B-12,) 1000 MCG/ML injection Inject 1,000 mcg into the muscle every 30 (thirty) days.     Yes Provider, Historical, Heather [name]  dicyclomine (BENTYL) 20 MG tablet Take 1 tablet (20 mg total) by mouth 3 (three) times daily before meals. Patient taking differently: Take 20 mg by mouth in the morning and at bedtime. 01/12/21  Yes Barton Dubois, Heather [name]  epoetin alfa-epbx  (RETACRIT) 3000 UNIT/ML injection Inject 3,000 Units into the skin every 14 (fourteen) days. 11/21/20  Yes Bhutani, Manpreet S, Heather [name]  esomeprazole (NEXIUM) 40 MG capsule Take 40 mg by mouth daily before breakfast.     Yes Provider, Historical, Heather [name]  fenofibrate (TRICOR) 145 MG tablet Take 1 tablet (145 mg total) by mouth daily. 09/07/20  Yes BranchAlphonse Guild, Heather [name]  furosemide (LASIX) 40 MG tablet Take 40 mg by mouth in the morning, at noon, and at bedtime.   Yes Provider, Historical, Heather [name]  gabapentin (NEURONTIN) 300 MG capsule Take 300 mg by mouth in the morning and at bedtime. *May take one additional capsule as needed for pain 10/20/20  Yes Provider, Historical, Heather [name]  HYDROmorphone (DILAUDID) 2 MG tablet Take 0.5 tablets (1 mg total) by mouth every 4 (four) hours as needed for severe pain. 05/18/21  Yes Isla Pence, Heather [name]  indomethacin (INDOCIN) 50 MG capsule Take 50 mg by mouth 3 (three) times daily. 07/05/21  Yes Provider, Historical, Heather [name]  insulin detemir (LEVEMIR FLEXTOUCH) 100 UNIT/ML FlexPen Inject 70 Units into the skin at bedtime. 08/03/21  Yes Brita Romp, NP  Insulin Lispro (HUMALOG KWIKPEN ) Inject 20-26 Units into the skin 3 (three) times daily with meals. Sliding scale   Yes Provider, Historical, Heather [name]  lipase/protease/amylase (CREON) 36000 UNITS CPEP capsule Take 1 capsule (36,000 Units total) by mouth with snacks. Patient taking differently: Take 36,000-72,000 Units by mouth See admin instructions. Takes 2 capsules with all meals and takes one capsule with snacks 01/12/21  Yes Barton Dubois, Heather [name]  meclizine (ANTIVERT) 25 MG tablet Take 1 tablet by mouth as needed for dizziness or nausea.   Yes Provider, Historical, Heather [name]  metoprolol tartrate (LOPRESSOR) 100 MG tablet Take 100 mg by mouth 2 (two) times daily. 05/07/21  Yes Provider, Historical, Heather [name]  omega-3 acid ethyl esters (LOVAZA) 1 g capsule Take 3 capsules by mouth 3 (three) times daily. 10/11/20  Yes Provider, Historical, Heather [name]  potassium chloride  SA (K-DUR,KLOR-CON) 20 MEQ tablet Take 20 mEq by mouth every morning.   Yes Provider, Historical, Heather [name]  promethazine (PHENERGAN) 25 MG tablet Take  1 tablet (25 mg total) by mouth every 6 (six) hours as needed for nausea or vomiting. 05/18/21  Yes Isla Pence, Heather [name]  temazepam (RESTORIL) 15 MG capsule Take 15 mg by mouth at bedtime as needed for sleep. 04/14/21  Yes Provider, Historical, Heather [name]  tiZANidine (ZANAFLEX) 4 MG tablet Take 8 mg by mouth at bedtime. 09/06/20  Yes Provider, Historical, Heather [name]  venlafaxine XR (EFFEXOR-XR) 150 MG 24 hr capsule Take 150 mg by mouth daily. 04/27/21  Yes Provider, Historical, Heather [name]  Adalimumab (HUMIRA PEN) 40 MG/0.4ML PNKT Inject 40 mg into the skin every 14 days Patient not taking: No sig reported 08/15/21   Gatha Mayer, Heather [name]  diphenoxylate-atropine (LOMOTIL) 2.5-0.025 MG tablet 1-2 tablets qid prn diarrhea Instead of Imodium Patient not taking: No sig reported 12/06/20   Gatha Mayer, Heather [name]  hydrALAZINE (APRESOLINE) 25 MG tablet Take 1 tablet (25 mg total) by mouth 3 (three) times daily. Patient not taking: No sig reported 05/18/21 08/16/21  Isla Pence, Heather [name]    Physical Exam: Vitals:   08/16/21 1600 08/16/21 1630 08/16/21 1700 08/16/21 1730  BP: (!) 169/80 (!) 162/85 (!) 170/90 (!) 191/89  Pulse: 73 75 72 74  Resp: 12 13 10 12   Temp:      TempSrc:      SpO2: 100% 100% 99% 100%  Weight:      Height:        Constitutional: NAD, calm, comfortable Vitals:   08/16/21 1600 08/16/21 1630 08/16/21 1700 08/16/21 1730  BP: (!) 169/80 (!) 162/85 (!) 170/90 (!) 191/89  Pulse: 73 75 72 74  Resp: 12 13 10 12   Temp:      TempSrc:      SpO2: 100% 100% 99% 100%  Weight:      Height:       Eyes: PERRL, lids and conjunctivae normal ENMT: Mucous membranes are moist.   Neck: normal, supple, no masses, no thyromegaly Respiratory: clear to auscultation bilaterally, no wheezing, no crackles. Normal respiratory effort. No accessory muscle use.  Cardiovascular: Regular rate  and rhythm, no murmurs / rubs / gallops. No extremity edema. 2+ pedal pulses. No carotid bruits.  Abdomen: no tenderness, no masses palpated. No hepatosplenomegaly. Bowel sounds positive.  Musculoskeletal: no clubbing / cyanosis. No joint deformity upper and lower extremities. Good ROM, no contractures. Normal muscle tone.  Skin: Petechial rash to extremities. Neurologic:  Neurological:     Mental Status: She is alert.     GCS: GCS eye subscore is 4. GCS verbal subscore is 5. GCS motor subscore is 6.     Comments: Mental Status:  Alert, oriented, thought content appropriate, able to give a coherent history. Speech fluent without evidence of aphasia. Able to follow 2 step commands without difficulty.  Cranial Nerves:  II:  Peripheral visual fields grossly normal, pupils equal, round, reactive to light III,IV, VI: ptosis not present, extra-ocular motions intact bilaterally  V,VII: smile symmetric, eyebrows raise symmetric, facial light touch sensation equal VIII: hearing grossly normal to voice  X: uvula elevates symmetrically  XI: bilateral shoulder shrug symmetric and strong XII: midline tongue extension without fassiculations Motor:  Normal tone.  5/5 strength in all extremities.  Equal full strength.   Sensory: Sensation intact to light touch in all extremities.  CV: distal pulses palpable throughout   Psychiatric: Normal judgment and insight. Alert and oriented x 3. Normal mood.   Labs on Admission: I have personally reviewed following labs and imaging studies  CBC: Recent Labs  Lab 08/16/21 1206  WBC 9.4  NEUTROABS 6.6  HGB 9.7*  HCT 30.4*  MCV 95.9  PLT 628   Basic Metabolic Panel: Recent Labs  Lab 08/16/21 1206  NA 139  K 3.1*  CL 106  CO2 21*  GLUCOSE 43*  BUN 56*  CREATININE 3.23*  CALCIUM 6.5*   Liver Function Tests: Recent Labs  Lab 08/16/21 1206  AST 21  ALT 17  ALKPHOS 57  BILITOT 1.0  PROT 6.9  ALBUMIN 3.4*   CBG: Recent Labs  Lab  08/16/21 1104 08/16/21 1344 08/16/21 1640  GLUCAP 89 126* 100*    Radiological Exams on Admission: MR ANGIO HEAD WO CONTRAST  Result Date: 08/16/2021 CLINICAL DATA:  Diplopia; Vision loss, binocular diplopia EXAM: MRA HEAD WITHOUT CONTRAST MRV HEAD WITHOUT CONTRAST TECHNIQUE: Angiographic images of the Circle of Willis were acquired using MRA technique without intravenous contrast. Angiographic images of the intracranial venous structures were acquired using MRV technique without intravenous contrast. COMPARISON:  MRA July 21 21. FINDINGS: MRA HEAD FINDINGS Anterior circulation: Chronically small left ACA with suspected superimposed distal left A1 and proximal A2 stenosis, possibly severe. Right A1 ACA is patent without significant stenosis. Preserved flow related signal in more distal ACAs bilaterally. Bilateral intracranial ICAs, MCAs are patent without proximal hemodynamically significant stenosis. Posterior circulation: Bilateral intradural dural vertebral arteries, basilar artery and posterior cerebral arteries are patent without proximal hemodynamically significant stenosis. Similar left posterior communicating artery, which appears despite left posterior cerebral artery territory. Similar suspected small or stenotic left P1 PCA. No aneurysm identified. Anatomic variants: Detailed above. MRV HEAD FINDINGS The in superior sagittal sinus, transverse and sigmoid sinuses, jugular bulbs, straight sinus, and visible deep cerebral veins are patent. The left transverse sinus is or narrowed. IMPRESSION: 1. No proximal large vessel occlusion. 2. Chronically small left ACA with suspected distal left A1 and proximal A2 stenosis, possibly severe. A CTA could confirm and further characterize if clinically indicated. 3. No evidence of dural venous sinus thrombosis. Electronically Signed   By: Margaretha Sheffield M.D.   On: 08/16/2021 18:38   MR BRAIN WO CONTRAST  Result Date: 08/16/2021 CLINICAL DATA:   Headache, new or worsening. Additional history provided: Patient reports blurry vision for 2 days, worsening. Patient also reports occipital head pain which radiates down back. EXAM: MRI HEAD WITHOUT CONTRAST TECHNIQUE: Multiplanar, multiecho pulse sequences of the brain and surrounding structures were obtained without intravenous contrast. COMPARISON:  Prior brain MRI examinations 05/18/2021 and earlier FINDINGS: Brain: Mild intermittent motion degradation. Mild generalized cerebral and cerebellar atrophy. There are symmetric subcentimeter remote insults within the globus pallidus bilaterally, a new finding as compared to prior exams (series 15, image 24). These may reflect chronic lacunar infarcts or sequela of a toxic/metabolic insult. Mild multifocal T2 FLAIR hyperintense signal abnormality within the cerebral white matter and pons, progressed from the prior MRI of 05/17/2020. Unchanged 6 mm dural-based mass overlying the anterior right frontal lobe, consistent with a small incidental meningioma. There is no acute infarct. No extra-axial fluid collection. No midline shift. Vascular: Maintained flow voids within the proximal large arterial vessels. Skull and upper cervical spine: No focal suspicious marrow lesion. Susceptibility artifact arising from ACDF hardware. Sinuses/Orbits: Visualized orbits show no acute finding. Bilateral lens replacements. No significant paranasal sinus disease. Other: Small-volume fluid within the left mastoid air cells. IMPRESSION: Mildly motion degraded exam. No evidence of acute intracranial abnormality. Small symmetric remote insults within the globus pallidus bilaterally, a new finding as compared to prior  exams. These may reflect chronic lacunar infarcts or sequela of a remote toxic/metabolic insult. Mild chronic small vessel ischemic changes within the cerebral white matter and pons, progressed. Unchanged size of a 6 mm meningioma overlying the right frontal lobe. Mild  generalized parenchymal atrophy. Small-volume fluid within the left mastoid air cells. Electronically Signed   By: Kellie Simmering D.O.   On: 08/16/2021 15:17   MR MRV HEAD WO CM  Result Date: 08/16/2021 CLINICAL DATA:  Diplopia; Vision loss, binocular diplopia EXAM: MRA HEAD WITHOUT CONTRAST MRV HEAD WITHOUT CONTRAST TECHNIQUE: Angiographic images of the Circle of Willis were acquired using MRA technique without intravenous contrast. Angiographic images of the intracranial venous structures were acquired using MRV technique without intravenous contrast. COMPARISON:  MRA July 21 21. FINDINGS: MRA HEAD FINDINGS Anterior circulation: Chronically small left ACA with suspected superimposed distal left A1 and proximal A2 stenosis, possibly severe. Right A1 ACA is patent without significant stenosis. Preserved flow related signal in more distal ACAs bilaterally. Bilateral intracranial ICAs, MCAs are patent without proximal hemodynamically significant stenosis. Posterior circulation: Bilateral intradural dural vertebral arteries, basilar artery and posterior cerebral arteries are patent without proximal hemodynamically significant stenosis. Similar left posterior communicating artery, which appears despite left posterior cerebral artery territory. Similar suspected small or stenotic left P1 PCA. No aneurysm identified. Anatomic variants: Detailed above. MRV HEAD FINDINGS The in superior sagittal sinus, transverse and sigmoid sinuses, jugular bulbs, straight sinus, and visible deep cerebral veins are patent. The left transverse sinus is or narrowed. IMPRESSION: 1. No proximal large vessel occlusion. 2. Chronically small left ACA with suspected distal left A1 and proximal A2 stenosis, possibly severe. A CTA could confirm and further characterize if clinically indicated. 3. No evidence of dural venous sinus thrombosis. Electronically Signed   By: Margaretha Sheffield M.D.   On: 08/16/2021 18:38    EKG: Independently  reviewed.  Rhythm rate 92.  QTc 499.Marland Kitchen  [facility].  Assessment/Plan Principal Problem:   Blurry vision, bilateral Active Problems:   Essential hypertension   Depression   Acute kidney injury superimposed on CKD (HCC)   Crohn's disease of small intestine with other complication (HCC)   Hypomagnesemia   Acute hypokalemia  Blurry vision with headache-MRI brain shows small symmetric remote insults in the globus pallidus, and chronic infarcts, unchanged 6 mm meningioma overlying the right frontal lobe.   - MRA, MRV brain no proximal large vessel occlusion, shows possibly severe chronic small left ACA, suspected distal left A1 and proximal A2 stenosis. -Neurology consulted, likely multifactorial headache secondary to blood pressure, metabolic abnormalities. -No improvement in pain after Reglan, 4 mg morphine, 0.5 mg Dilaudid and Zofran given.   - Trial of 1 mg Dilaudid. -Reports similar headache in the past resolved after 2 days. -Avoiding NSAIDs due to AKI on CKD -Neurology consultation in the morning  Uncontrolled hypertension/urgency-systolic ranging from 381-017.  Chronically uncontrolled.  Possibly contributing to her symptoms. -Reports compliance with clonidine 0.2 mg twice daily, and metoprolol 100 mg twice daily -Appears she was prescribed hydralazine but she tells me she is not taking it. -Multiple medication allergy -Will start hydralazine 25 mg twice daily -Appears to be sensitive to labetalol, 5 mg IV labetalol given of 20 mg ordered, blood pressure dropped to 510 systolic. -Avoiding IV antihypertensives as much as possible  AKI on CKD-creatinine 3.23, baseline appears to be 1.6-2.1.  In the setting of chronic diarrhea, indomethacin and Lasix 40 mg use. -Avoid NSAIDs, nephrotoxic's -Hold Lasix for now - 1 l bolus  given cont N/s 100cc/hr x 20hrs  Diabetes mellitus with hypoglycemia-glucose down to 43.  Received 8mls D50.  Reports poor oral intake today. -Monitor blood sugars every  2 hourly x4  - SSI-S,  in a.m, -Hold home Levemir 70 units nightly for now.  Hypokalemia, severe hypomagnesemia potassium 3.1.  Magnesium 0.7.  Likely from chronic diarrhea. -4 g IV magnesium -Recheck in a.m.  Crohn's disease-unchanged Baseline 5-6 bowel movements daily.  Likely contributing to AKI.  On Humira.  Petechial rash likely allergic reaction to ? Name of medication.   Chronic pancreatitis-stable. - Resume Creon  Depression  - resume Celexa, Effexor, and nightly temazepam   DVT prophylaxis: Lovenox Code Status: Full code Family Communication: None at bedside  Consults called: Neurology Admission status: Obs, tele    Heather Roys Heather [name] [facility]  08/16/2021, 9:22 PM

## 2021-08-16 NOTE — ED Notes (Addendum)
Administered 5mg  of labetol. Pts. Pressure went from 210/84 to 140/76. Notified and Dr. Maudie Mercury. Pressure is now 107/93.

## 2021-08-16 NOTE — ED Provider Notes (Signed)
[facility] EMERGENCY DEPARTMENT Provider Note   CSN: [id number] Arrival date & time: 08/16/21  1050     History Chief Complaint  Patient presents with   Blurred Vision    Heather [name] is a 70 y.o. female with past medical history including chronic renal insufficiency stage III, frontal brain meningioma, diabetes, hypertension GERD and hyperlipidemia presenting for evaluation of a 2-day history of blurred vision along with an occipital headache.  She describes waking 2 days ago with mild bilateral blurred vision along with a chronic headache with worsening sharp stabs of pain in her occipital region.  She reports that when she has an acute stab of pain  her vision briefly "grays out" but then returns to blurriness.  She also reports double vision, commented during initial conversation that she actually sees 3 of me.  Review of the chart does document a diagnosis of diplopia.  Patient states this was many years ago and she does not recall the exact diagnosis but the symptom went away until now.  She does not wear glasses except for reading.  She denies head injury or falls.  Also denies fevers or chills, she has been nauseated without vomiting.  Her headache pain is slightly better when lying down.  She has had no weakness or numbness in her arms or legs, denies dizziness.  The history is provided by the patient.      Past Medical History:  Diagnosis Date   Acute renal failure superimposed on stage 3 chronic kidney disease ([place]) 01/21/2016   Anemia    Anxiety    B12 deficiency    Brain tumor (benign) ([place]) 10/29/2020   Cellulitis and abscess    Abdomen and buttocks   CKD (chronic kidney disease) stage 3, GFR 30-59 ml/min (HCC)    Closed fracture of distal end of femur, unspecified fracture morphology, initial encounter ([place])    Depression    Diabetic neuropathy ([place])    feet   DJD (degenerative joint disease)    Right forminal stenosis C4-5   Essential hypertension    Femur  fracture, right ([place]) 5/0/3546   Folliculitis    GERD (gastroesophageal reflux disease)    Gout    Headache(784.0)    Hiatal hernia    History of blood transfusion    History of bronchitis    History of cardiac catheterization    No significant CAD 2012   History of kidney stones    surgery to remove   Insomnia    Irritable bowel syndrome    Mixed hyperlipidemia    Osteoarthritis    Palpitations    Pneumonia    Reflux esophagitis    Salmonella    Stroke [facility])    Tinnitus    Right   Type 2 diabetes mellitus ([place])     Patient Active Problem List   Diagnosis Date Noted   Blurry vision, bilateral 08/16/2021   Acute pancreatitis    Anemia    E coli enteritis 01/09/2021   Acute on chronic pancreatitis ([place]) 01/08/2021   Acute renal failure superimposed on stage 3b chronic kidney disease ([place]) 01/07/2021   Diarrhea    Acute hypokalemia 01/06/2021   Long-term use of immunosuppressant medication-anticipated 11/28/2020   Headache 05/17/2020   Hypokalemia 05/17/2020   Hypomagnesemia 05/17/2020   Dehydration 05/17/2020   Hypertensive urgency 05/17/2020   Diplopia 05/17/2020   Left hip pain 09/22/2019   Lumbar radiculopathy 09/22/2019   Postoperative anemia due to acute blood loss 09/18/2019  Postoperative urinary retention 09/18/2019   Closed fracture of distal end of radius 05/26/2019   Closed right hip fracture ([place]) 04/08/2018   Elevated lipase    Crohn's disease of small intestine with other complication ([place])    Protein-calorie malnutrition, severe 03/05/2018   Neck pain 03/12/2017   Acute kidney injury superimposed on CKD ([place]) 01/25/2016   Neuropathy 03/02/2015   Pain in the chest    Chest pain 12/22/2014   Essential hypertension 12/22/2014   Type 2 diabetes with nephropathy ([place]) 12/22/2014   Hiatal hernia 12/22/2014   GERD (gastroesophageal reflux disease) 12/22/2014   Gout 12/22/2014   Mixed hyperlipidemia 12/22/2014   Depression 12/22/2014   Type 2  diabetes mellitus without complications ([place]) 54/27/0623   Fusion of spine, cervicothoracic region 11/01/2014   Pseudophakia, left eye 10/20/2014   Chronic kidney disease, stage III (moderate) ([place]) 08/25/2014   Cervical stenosis of spine 08/26/2013   Vitamin B12 deficiency 07/08/2011   Personal history of failed moderate sedation 04/16/2011   Chronic Nausea and Vomiting - ? gastroapresis 04/16/2011   Early satiety 04/16/2011   Irritable bowel syndrome 04/16/2011   Benign paroxysmal positional vertigo 11/20/2010   TRICUSPID REGURGITATION, MODERATE 11/20/2010   PULMONARY HYPERTENSION, MILD 11/20/2010   LEG EDEMA, BILATERAL 11/20/2010   DYSPNEA 11/20/2010   NONSPECIFIC ABNORMAL ELECTROCARDIOGRAM 11/20/2010   CHEST WALL PAIN, HX OF 11/20/2010    Past Surgical History:  Procedure Laterality Date   BACK SURGERY     c-spine surgery     03/2007   CARDIAC CATHETERIZATION     CHOLECYSTECTOMY     COLONOSCOPY  04/21/2011; 05/29/11   6/12: [place] - ?AVM at IC valve, inflammatory changes at IC valve [facility]); 7/12 - gessner; IC valve erosions, look chronic and probably Crohn's per path   colonoscopy  2017   : random biopsies normal. TI normal   ESOPHAGOGASTRODUODENOSCOPY  05/29/11   normal   ESOPHAGOGASTRODUODENOSCOPY N/A 01/21/2016   Dr. Michail Sermon: normal   EYE SURGERY Bilateral    cataracts removed   FEMUR IM NAIL Right 04/10/2018   Procedure: INTRAMEDULLARY (IM) NAIL FEMORAL;  Surgeon: Paralee Cancel, MD;  Location: [facility];  Service: Orthopedics;  Laterality: Right;   FRACTURE SURGERY     GIVENS CAPSULE STUDY  11/17/2020   HARDWARE REMOVAL Right 04/10/2018   Procedure: HARDWARE REMOVAL RIGHT DISTAL FEMUR;  Surgeon: Paralee Cancel, MD;  Location: [facility];  Service: Orthopedics;  Laterality: Right;   HERNIA REPAIR     IR FLUORO GUIDE CV LINE RIGHT  04/01/2017   IR US GUIDE VASC ACCESS RIGHT  04/01/2017   JOINT REPLACEMENT Right    hip   OPEN REDUCTION INTERNAL FIXATION  (ORIF) DISTAL RADIAL FRACTURE Right 05/28/2019   Procedure: OPEN REDUCTION INTERNAL FIXATION (ORIF) DISTAL RADIAL FRACTURE;  Surgeon: Verner Mould, MD;  Location: [place];  Service: Orthopedics;  Laterality: Right;  with block 90 minutes   ORIF FEMUR FRACTURE Right 03/31/2017   Procedure: OPEN REDUCTION INTERNAL FIXATION (ORIF) DISTAL FEMUR FRACTURE;  Surgeon: Paralee Cancel, MD;  Location: [place];  Service: Orthopedics;  Laterality: Right;   removal of kidney stone     Right knee arthroscopic surgery     TONSILLECTOMY     TOTAL ABDOMINAL HYSTERECTOMY       OB History   No obstetric history on file.     Family History  Problem Relation Age of Onset   Diabetes Mother    Colon cancer Brother  POSSIBLY. Patient states diagnosed at age 58, then later states age 48. unclear and limited historian   Esophageal cancer Neg Hx    Rectal cancer Neg Hx    Stomach cancer Neg Hx     Social History   Tobacco Use   Smoking status: Never   Smokeless tobacco: Never  Vaping Use   Vaping Use: Never used  Substance Use Topics   Alcohol use: No   Drug use: No    Home Medications Prior to Admission medications   Medication Sig Start Date End Date Taking? Authorizing Provider  acetaminophen (TYLENOL) 650 MG CR tablet Take 650 mg by mouth every 8 (eight) hours as needed for pain.   Yes [provider]  Adalimumab (HUMIRA PEN) 40 MG/0.4ML PNKT Inject 40 mg into the skin every 14 (fourteen) days. 08/15/21  Yes Gatha Mayer, MD  albuterol (VENTOLIN HFA) 108 (90 Base) MCG/ACT inhaler Inhale 1-2 puffs into the lungs every 6 (six) hours as needed for shortness of breath or wheezing. 02/16/21 02/16/22 Yes [provider]  allopurinol (ZYLOPRIM) 100 MG tablet Take 100 mg by mouth daily. 03/20/21  Yes [provider]  Ascorbic Acid (VITAMIN C WITH ROSE HIPS) 500 MG tablet Take 500 mg by mouth every morning.   Yes [provider]  atorvastatin (LIPITOR) 80 MG  tablet Take 80 mg by mouth every morning.   Yes [provider]  Cholecalciferol (VITAMIN D) 2000 units CAPS Take 2,000 Units by mouth every morning.    Yes [provider]  citalopram (CELEXA) 20 MG tablet Take 1 tablet (20 mg total) by mouth daily. 01/12/21  Yes Barton Dubois, MD  cloNIDine (CATAPRES) 0.2 MG tablet Take 0.2 mg by mouth 2 (two) times daily.   Yes [provider]  cyanocobalamin (,VITAMIN B-12,) 1000 MCG/ML injection Inject 1,000 mcg into the muscle every 30 (thirty) days.     Yes [provider]  dicyclomine (BENTYL) 20 MG tablet Take 1 tablet (20 mg total) by mouth 3 (three) times daily before meals. Patient taking differently: Take 20 mg by mouth in the morning and at bedtime. 01/12/21  Yes Barton Dubois, MD  epoetin alfa-epbx (RETACRIT) 3000 UNIT/ML injection Inject 3,000 Units into the skin every 14 (fourteen) days. 11/21/20  Yes Bhutani, Manpreet S, MD  esomeprazole (NEXIUM) 40 MG capsule Take 40 mg by mouth daily before breakfast.     Yes [provider]  fenofibrate (TRICOR) 145 MG tablet Take 1 tablet (145 mg total) by mouth daily. 09/07/20  Yes BranchAlphonse Guild, MD  furosemide (LASIX) 40 MG tablet Take 40 mg by mouth in the morning, at noon, and at bedtime.   Yes [provider]  gabapentin (NEURONTIN) 300 MG capsule Take 300 mg by mouth in the morning and at bedtime. *May take one additional capsule as needed for pain 10/20/20  Yes [provider]  HYDROmorphone (DILAUDID) 2 MG tablet Take 0.5 tablets (1 mg total) by mouth every 4 (four) hours as needed for severe pain. 05/18/21  Yes Isla Pence, MD  indomethacin (INDOCIN) 50 MG capsule Take 50 mg by mouth 3 (three) times daily. 07/05/21  Yes [provider]  insulin detemir (LEVEMIR FLEXTOUCH) 100 UNIT/ML FlexPen Inject 70 Units into the skin at bedtime. 08/03/21  Yes Brita Romp, NP  Insulin Lispro (HUMALOG KWIKPEN [place]) Inject 20-26 Units into  the skin 3 (three) times daily with meals. Sliding scale   Yes [provider]  lipase/protease/amylase (CREON)  36000 UNITS CPEP capsule Take 1 capsule (36,000 Units total) by mouth with snacks. Patient taking differently: Take 36,000-72,000 Units by mouth See admin instructions. Takes 2 capsules with all meals and takes one capsule with snacks 01/12/21  Yes Barton Dubois, MD  meclizine (ANTIVERT) 25 MG tablet Take 1 tablet by mouth as needed for dizziness or nausea.   Yes [provider]  metoprolol tartrate (LOPRESSOR) 100 MG tablet Take 100 mg by mouth 2 (two) times daily. 05/07/21  Yes [provider]  omega-3 acid ethyl esters (LOVAZA) 1 g capsule Take 3 capsules by mouth 3 (three) times daily. 10/11/20  Yes [provider]  potassium chloride SA (K-DUR,KLOR-CON) 20 MEQ tablet Take 20 mEq by mouth every morning.   Yes [provider]  promethazine (PHENERGAN) 25 MG tablet Take 1 tablet (25 mg total) by mouth every 6 (six) hours as needed for nausea or vomiting. 05/18/21  Yes Isla Pence, MD  temazepam (RESTORIL) 15 MG capsule Take 15 mg by mouth at bedtime as needed for sleep. 04/14/21  Yes [provider]  tiZANidine (ZANAFLEX) 4 MG tablet Take 8 mg by mouth at bedtime. 09/06/20  Yes [provider]  venlafaxine XR (EFFEXOR-XR) 150 MG 24 hr capsule Take 150 mg by mouth daily. 04/27/21  Yes [provider]  Adalimumab (HUMIRA PEN) 40 MG/0.4ML PNKT Inject 40 mg into the skin every 14 days Patient not taking: No sig reported 08/15/21   Gatha Mayer, MD  diphenoxylate-atropine (LOMOTIL) 2.5-0.025 MG tablet 1-2 tablets qid prn diarrhea Instead of Imodium Patient not taking: No sig reported 12/06/20   Gatha Mayer, MD  hydrALAZINE (APRESOLINE) 25 MG tablet Take 1 tablet (25 mg total) by mouth 3 (three) times daily. Patient not taking: No sig reported 05/18/21 08/16/21  Isla Pence, MD    Allergies    Duloxetine hcl,  Fluocinolone, Acetaminophen, Amlodipine besylate, Ciprofloxacin, Hydrocodone-acetaminophen, Metformin, Nsaids, Quinolones, Sulfamethoxazole, Valsartan, Cozaar [losartan], Lisinopril, and Tape  Review of Systems   Review of Systems  Constitutional:  Negative for chills and fever.  HENT:  Negative for congestion and sore throat.   Eyes:  Positive for visual disturbance.  Respiratory:  Negative for chest tightness and shortness of breath.   Cardiovascular:  Negative for chest pain.  Gastrointestinal:  Positive for nausea. Negative for abdominal pain and vomiting.  Genitourinary: Negative.   Musculoskeletal:  Negative for arthralgias, joint swelling and neck pain.  Skin: Negative.  Negative for rash and wound.  Neurological:  Positive for headaches. Negative for dizziness, weakness, light-headedness and numbness.  Psychiatric/Behavioral: Negative.     Physical Exam Updated Vital Signs BP (!) 191/89   Pulse 74   Temp 98.3 F (36.8 C) (Oral)   Resp 12   Ht 5\' 3"  (1.6 m)   Wt 68 kg   LMP  (LMP Unknown)   SpO2 100%   BMI 26.57 kg/m   Physical Exam Vitals and nursing note reviewed.  Constitutional:      Appearance: She is well-developed.  HENT:     Head: Normocephalic and atraumatic.     Right Ear: Tympanic membrane normal.     Left Ear: Tympanic membrane normal.  Eyes:     Extraocular Movements: Extraocular movements intact.     Conjunctiva/sclera: Conjunctivae normal.     Pupils: Pupils are equal, round, and reactive to light.     Comments: Visual acuity is 20/100 OS/OD/OU  Cardiovascular:     Rate and Rhythm: Normal rate and regular  rhythm.     Heart sounds: Normal heart sounds.     Comments: Patient remains hypertensive. Pulmonary:     Effort: Pulmonary effort is normal.     Breath sounds: Normal breath sounds. No wheezing.  Abdominal:     General: Bowel sounds are normal.     Palpations: Abdomen is soft.     Tenderness: There is no abdominal tenderness.   Musculoskeletal:        General: Normal range of motion.     Cervical back: Normal range of motion and neck supple.  Lymphadenopathy:     Cervical: No cervical adenopathy.  Skin:    General: Skin is warm and dry.     Findings: No rash.  Neurological:     General: No focal deficit present.     Mental Status: She is alert and oriented to person, place, and time.     GCS: GCS eye subscore is 4. GCS verbal subscore is 5. GCS motor subscore is 6.     Cranial Nerves: No cranial nerve deficit.     Sensory: No sensory deficit.     Motor: No weakness.     Coordination: Coordination normal.     Gait: Gait normal.     Deep Tendon Reflexes: Reflexes normal.     Comments: Normal heel-shin, normal rapid alternating movements. Cranial nerves III-XII intact.  No pronator drift.  Psychiatric:        Speech: Speech normal.        Behavior: Behavior normal.        Thought Content: Thought content normal.    ED Results / Procedures / Treatments   Labs (all labs ordered are listed, but only abnormal results are displayed) Labs Reviewed  CBC WITH DIFFERENTIAL/PLATELET - Abnormal; Notable for the following components:      Result Value   RBC 3.17 (*)    Hemoglobin 9.7 (*)    HCT 30.4 (*)    Abs Immature Granulocytes 0.12 (*)    All other components within normal limits  COMPREHENSIVE METABOLIC PANEL - Abnormal; Notable for the following components:   Potassium 3.1 (*)    CO2 21 (*)    Glucose, Bld 43 (*)    BUN 56 (*)    Creatinine, Ser 3.23 (*)    Calcium 6.5 (*)    Albumin 3.4 (*)    GFR, Estimated 15 (*)    All other components within normal limits  CBG MONITORING, ED - Abnormal; Notable for the following components:   Glucose-Capillary 126 (*)    All other components within normal limits  CBG MONITORING, ED - Abnormal; Notable for the following components:   Glucose-Capillary 100 (*)    All other components within normal limits  CBG MONITORING, ED    EKG EKG  Interpretation  Date/Time:  Wednesday August 16 2021 11:08:23 EDT Ventricular Rate:  92 PR Interval:  170 QRS Duration: 82 QT Interval:  404 QTC Calculation: 499 R Axis:   50 Text Interpretation: Normal sinus rhythm Minimal voltage criteria for LVH, may be normal variant ( R in aVL ) ST & T wave abnormality, consider inferior ischemia Prolonged QT Abnormal ECG T wave abnormality , new Confirmed by Noemi Chapel [phone]) on 08/16/2021 5:55:02 PM  Radiology MR ANGIO HEAD WO CONTRAST  Result Date: 08/16/2021 CLINICAL DATA:  Diplopia; Vision loss, binocular diplopia EXAM: MRA HEAD WITHOUT CONTRAST MRV HEAD WITHOUT CONTRAST TECHNIQUE: Angiographic images of the Circle of Willis were acquired using MRA technique without intravenous  contrast. Angiographic images of the intracranial venous structures were acquired using MRV technique without intravenous contrast. COMPARISON:  MRA July 21 21. FINDINGS: MRA HEAD FINDINGS Anterior circulation: Chronically small left ACA with suspected superimposed distal left A1 and proximal A2 stenosis, possibly severe. Right A1 ACA is patent without significant stenosis. Preserved flow related signal in more distal ACAs bilaterally. Bilateral intracranial ICAs, MCAs are patent without proximal hemodynamically significant stenosis. Posterior circulation: Bilateral intradural dural vertebral arteries, basilar artery and posterior cerebral arteries are patent without proximal hemodynamically significant stenosis. Similar left posterior communicating artery, which appears despite left posterior cerebral artery territory. Similar suspected small or stenotic left P1 PCA. No aneurysm identified. Anatomic variants: Detailed above. MRV HEAD FINDINGS The in superior sagittal sinus, transverse and sigmoid sinuses, jugular bulbs, straight sinus, and visible deep cerebral veins are patent. The left transverse sinus is or narrowed. IMPRESSION: 1. No proximal large vessel occlusion. 2.  Chronically small left ACA with suspected distal left A1 and proximal A2 stenosis, possibly severe. A CTA could confirm and further characterize if clinically indicated. 3. No evidence of dural venous sinus thrombosis. Electronically Signed   By: Margaretha Sheffield M.D.   On: 08/16/2021 18:38   MR BRAIN WO CONTRAST  Result Date: 08/16/2021 CLINICAL DATA:  Headache, new or worsening. Additional history provided: Patient reports blurry vision for 2 days, worsening. Patient also reports occipital head pain which radiates down back. EXAM: MRI HEAD WITHOUT CONTRAST TECHNIQUE: Multiplanar, multiecho pulse sequences of the brain and surrounding structures were obtained without intravenous contrast. COMPARISON:  Prior brain MRI examinations 05/18/2021 and earlier FINDINGS: Brain: Mild intermittent motion degradation. Mild generalized cerebral and cerebellar atrophy. There are symmetric subcentimeter remote insults within the globus pallidus bilaterally, a new finding as compared to prior exams (series 15, image 24). These may reflect chronic lacunar infarcts or sequela of a toxic/metabolic insult. Mild multifocal T2 FLAIR hyperintense signal abnormality within the cerebral white matter and pons, progressed from the prior MRI of 05/17/2020. Unchanged 6 mm dural-based mass overlying the anterior right frontal lobe, consistent with a small incidental meningioma. There is no acute infarct. No extra-axial fluid collection. No midline shift. Vascular: Maintained flow voids within the proximal large arterial vessels. Skull and upper cervical spine: No focal suspicious marrow lesion. Susceptibility artifact arising from ACDF hardware. Sinuses/Orbits: Visualized orbits show no acute finding. Bilateral lens replacements. No significant paranasal sinus disease. Other: Small-volume fluid within the left mastoid air cells. IMPRESSION: Mildly motion degraded exam. No evidence of acute intracranial abnormality. Small symmetric remote  insults within the globus pallidus bilaterally, a new finding as compared to prior exams. These may reflect chronic lacunar infarcts or sequela of a remote toxic/metabolic insult. Mild chronic small vessel ischemic changes within the cerebral white matter and pons, progressed. Unchanged size of a 6 mm meningioma overlying the right frontal lobe. Mild generalized parenchymal atrophy. Small-volume fluid within the left mastoid air cells. Electronically Signed   By: Kellie Simmering D.O.   On: 08/16/2021 15:17   MR MRV HEAD WO CM  Result Date: 08/16/2021 CLINICAL DATA:  Diplopia; Vision loss, binocular diplopia EXAM: MRA HEAD WITHOUT CONTRAST MRV HEAD WITHOUT CONTRAST TECHNIQUE: Angiographic images of the Circle of Willis were acquired using MRA technique without intravenous contrast. Angiographic images of the intracranial venous structures were acquired using MRV technique without intravenous contrast. COMPARISON:  MRA July 21 21. FINDINGS: MRA HEAD FINDINGS Anterior circulation: Chronically small left ACA with suspected superimposed distal left A1 and proximal A2 stenosis,  possibly severe. Right A1 ACA is patent without significant stenosis. Preserved flow related signal in more distal ACAs bilaterally. Bilateral intracranial ICAs, MCAs are patent without proximal hemodynamically significant stenosis. Posterior circulation: Bilateral intradural dural vertebral arteries, basilar artery and posterior cerebral arteries are patent without proximal hemodynamically significant stenosis. Similar left posterior communicating artery, which appears despite left posterior cerebral artery territory. Similar suspected small or stenotic left P1 PCA. No aneurysm identified. Anatomic variants: Detailed above. MRV HEAD FINDINGS The in superior sagittal sinus, transverse and sigmoid sinuses, jugular bulbs, straight sinus, and visible deep cerebral veins are patent. The left transverse sinus is or narrowed. IMPRESSION: 1. No proximal  large vessel occlusion. 2. Chronically small left ACA with suspected distal left A1 and proximal A2 stenosis, possibly severe. A CTA could confirm and further characterize if clinically indicated. 3. No evidence of dural venous sinus thrombosis. Electronically Signed   By: Margaretha Sheffield M.D.   On: 08/16/2021 18:38    Procedures Procedures   Medications Ordered in ED Medications  dextrose 50 % solution (  Not Given 08/16/21 1310)  calcium gluconate 1 g/ 50 mL sodium chloride IVPB (has no administration in time range)  potassium chloride SA (KLOR-CON) CR tablet 40 mEq (has no administration in time range)  ondansetron (ZOFRAN) injection 4 mg (4 mg Intravenous Given 08/16/21 1315)  morphine 4 MG/ML injection 4 mg (4 mg Intravenous Given 08/16/21 1323)  dextrose 50 % solution (  Given 08/16/21 1308)  metoCLOPramide (REGLAN) injection 10 mg (10 mg Intravenous Given 08/16/21 1513)  HYDROmorphone (DILAUDID) injection 0.5 mg (0.5 mg Intravenous Given 08/16/21 1433)  sodium chloride 0.9 % bolus 1,000 mL (1,000 mLs Intravenous New Bag/Given 08/16/21 1715)  labetalol (NORMODYNE) injection 20 mg (20 mg Intravenous Given 08/16/21 1840)    ED Course  I have reviewed the triage vital signs and the nursing notes.  Pertinent labs & imaging results that were available during my care of the patient were reviewed by me and considered in my medical decision making (see chart for details).    MDM Rules/Calculators/A&P                           Pt with blurred vision and diplopia, MRI imaging does not give a clear indication for an intracranial process causing the symptoms.  Discussed with Dr. Curly Shores with neurology who reviewed the chart and the imaging.  She has been seen by Dr. Merlene Laughter in the past for similar symptoms in the past of unclear etiology.  She did have significant electrolyte derangements, she has a corrected calcium of 7.0 with QT prolongation, calcium gluconate 1 gram ordered.   Elevating  bp  during her ed stay- has not responded to pain medication, nor has her headache.  Labetalol 20 mg ordered.   Dr. Curly Shores felt pt could stay at [facility] for admission with consult by Dr. Merlene Laughter in am prn.   Discussed with Dr. Denton Brick who accepts pt for admission. Final Clinical Impression(s) / ED Diagnoses Final diagnoses:  Blurred vision, bilateral  Occipital headache  AKI (acute kidney injury) ([place])  Hypocalcemia  Hypokalemia  Hypoglycemia    Rx / DC Orders ED Discharge Orders     None        Landis Martins 08/16/21 1843    Milton Ferguson, MD 08/19/21 1000

## 2021-08-16 NOTE — Plan of Care (Signed)
Discussed with PA Evalee Jefferson briefly. Patient w/ complex PMHx as below, multiple stroke risk factors, presenting with new onset bilateral blurry vision acuity 20/100 with intermittent graying of vision associated with stabs of pain from severe occipital headache.   Labs notable for AKI on CKD, initial hypoglycemia corrected with dextrose, BUN 56, hypocalcemia to 6.5 w/ albumin 3.4 (corrects to 7), hypokalemia to 3.1  No improvement with 0.5 mg hydromorphone + 4 mg zofran so given 4 mg morphine and 10 mg reglan; also 1 L normal saline  On review of chart, seen 05/18/2020 by Dr. Merlene Laughter for similar symptoms:  "This is a 70 year old right-handed white female who reports having severe headache which occurred relatively abruptly a couple of days ago. She tells me however that few days before then she was having milder headaches which seems of gotten dramatically worse on the day of presentation. The headaches are bifrontal and periorbital. She reports that it got so severe that she had a couple bouts of nausea and vomiting once. She reports that he got as high as about nine. She reports other associated symptoms including gait ataxia and unsteadiness, periorbital numbness and pins and needle in the neck region. She also reports having binocular horizontal diplopia which lasted several hours. The diplopia has resolved but has blurry vision from time to time. The patient reports that she has not been a patient with frequent episodic headaches. She tells me she may have a headache about once every 5 years. Her blood pressure has been difficult to control and got really worse after being admitted recently. Blood pressure has been as high as 240/100. Patient has had extensive posterior cervical fusion many years ago. She reports the surgery has been very successful and has not had pain in the area until last day or so. She does not report focal neurological symptoms well in the upper or lower extremities. The review of  systems otherwise negative."  Likely multifactorial headache secondary to BP, metabolic abnormalities, but recommend vessel imaging to rule out dangerous etiologies.   Recommend: MRA w/o contrast MRV w/o contrast Gradual BP normalization  Neurology re-evaluation if symptoms don't improve with BP control  Continued supportive care including correction of electrolyte derangements    These are curbside recommendations only   Current vital signs: BP (!) 162/85   Pulse 75   Temp 98.3 F (36.8 C) (Oral)   Resp 13   Ht 5\' 3"  (1.6 m)   Wt 68 kg   LMP  (LMP Unknown)   SpO2 100%   BMI 26.57 kg/m  Vital signs in last 24 hours: Temp:  [98 F (36.7 C)-98.3 F (36.8 C)] 98.3 F (36.8 C) (10/19 1058) Pulse Rate:  [73-95] 75 (10/19 1630) Resp:  [12-28] 13 (10/19 1630) BP: (150-181)/(74-86) 162/85 (10/19 1630) SpO2:  [98 %-100 %] 100 % (10/19 1630) Weight:  [68 kg] 68 kg (10/19 1058)   MRI brain personally reviewed, agree with radiology:  Mildly motion degraded exam.   No evidence of acute intracranial abnormality.   Small symmetric remote insults within the globus pallidus bilaterally, a new finding as compared to prior exams. These may reflect chronic lacunar infarcts or sequela of a remote toxic/metabolic insult.   Mild chronic small vessel ischemic changes within the cerebral white matter and pons, progressed.   Unchanged size of a 6 mm meningioma overlying the right frontal lobe.   Mild generalized parenchymal atrophy.   Small-volume fluid within the left mastoid air cells.  Past Medical History:  Diagnosis Date   Acute renal failure superimposed on stage 3 chronic kidney disease ([place]) 01/21/2016   Anemia    Anxiety    B12 deficiency    Brain tumor (benign) ([place]) 10/29/2020   Cellulitis and abscess    Abdomen and buttocks   CKD (chronic kidney disease) stage 3, GFR 30-59 ml/min ([facility])    Closed fracture of distal end of femur, unspecified fracture  morphology, initial encounter ([place])    Depression    Diabetic neuropathy (HCC)    feet   DJD (degenerative joint disease)    Right forminal stenosis C4-5   Essential hypertension    Femur fracture, right ([place]) 03/30/3761   Folliculitis    GERD (gastroesophageal reflux disease)    Gout    Headache(784.0)    Hiatal hernia    History of blood transfusion    History of bronchitis    History of cardiac catheterization    No significant CAD 2012   History of kidney stones    surgery to remove   Insomnia    Irritable bowel syndrome    Mixed hyperlipidemia    Osteoarthritis    Palpitations    Pneumonia    Reflux esophagitis    Salmonella    Stroke [facility])    Tinnitus    Right   Type 2 diabetes mellitus ([place])     Past Surgical History:  Procedure Laterality Date   BACK SURGERY     c-spine surgery     03/2007   CARDIAC CATHETERIZATION     CHOLECYSTECTOMY     COLONOSCOPY  04/21/2011; 05/29/11   6/12: [place] - ?AVM at IC valve, inflammatory changes at IC valve [facility]); 7/12 - [place]; IC valve erosions, look chronic and probably Crohn's per path   colonoscopy  2017   [place]: random biopsies normal. TI normal   ESOPHAGOGASTRODUODENOSCOPY  05/29/11   normal   ESOPHAGOGASTRODUODENOSCOPY N/A 01/21/2016   Dr. Michail Sermon: normal   EYE SURGERY Bilateral    cataracts removed   FEMUR IM NAIL Right 04/10/2018   Procedure: INTRAMEDULLARY (IM) NAIL FEMORAL;  Surgeon: Paralee Cancel, MD;  Location: [facility];  Service: Orthopedics;  Laterality: Right;   FRACTURE SURGERY     GIVENS CAPSULE STUDY  11/17/2020   HARDWARE REMOVAL Right 04/10/2018   Procedure: HARDWARE REMOVAL RIGHT DISTAL FEMUR;  Surgeon: Paralee Cancel, MD;  Location: [facility];  Service: Orthopedics;  Laterality: Right;   HERNIA REPAIR     IR FLUORO GUIDE CV LINE RIGHT  04/01/2017   IR US GUIDE VASC ACCESS RIGHT  04/01/2017   JOINT REPLACEMENT Right    hip   OPEN REDUCTION INTERNAL FIXATION (ORIF) DISTAL RADIAL FRACTURE  Right 05/28/2019   Procedure: OPEN REDUCTION INTERNAL FIXATION (ORIF) DISTAL RADIAL FRACTURE;  Surgeon: Verner Mould, MD;  Location: [place];  Service: Orthopedics;  Laterality: Right;  with block 90 minutes   ORIF FEMUR FRACTURE Right 03/31/2017   Procedure: OPEN REDUCTION INTERNAL FIXATION (ORIF) DISTAL FEMUR FRACTURE;  Surgeon: Paralee Cancel, MD;  Location: [place];  Service: Orthopedics;  Laterality: Right;   removal of kidney stone     Right knee arthroscopic surgery     TONSILLECTOMY     TOTAL ABDOMINAL HYSTERECTOMY

## 2021-08-17 DIAGNOSIS — Z20822 Contact with and (suspected) exposure to covid-19: Secondary | ICD-10-CM | POA: Diagnosis present

## 2021-08-17 DIAGNOSIS — E11649 Type 2 diabetes mellitus with hypoglycemia without coma: Secondary | ICD-10-CM | POA: Diagnosis present

## 2021-08-17 DIAGNOSIS — J189 Pneumonia, unspecified organism: Secondary | ICD-10-CM | POA: Diagnosis present

## 2021-08-17 DIAGNOSIS — G9341 Metabolic encephalopathy: Secondary | ICD-10-CM | POA: Diagnosis not present

## 2021-08-17 DIAGNOSIS — I5031 Acute diastolic (congestive) heart failure: Secondary | ICD-10-CM | POA: Diagnosis not present

## 2021-08-17 DIAGNOSIS — D631 Anemia in chronic kidney disease: Secondary | ICD-10-CM | POA: Diagnosis present

## 2021-08-17 DIAGNOSIS — F32A Depression, unspecified: Secondary | ICD-10-CM | POA: Diagnosis present

## 2021-08-17 DIAGNOSIS — R918 Other nonspecific abnormal finding of lung field: Secondary | ICD-10-CM | POA: Diagnosis not present

## 2021-08-17 DIAGNOSIS — I6330 Cerebral infarction due to thrombosis of unspecified cerebral artery: Secondary | ICD-10-CM | POA: Diagnosis not present

## 2021-08-17 DIAGNOSIS — I10 Essential (primary) hypertension: Secondary | ICD-10-CM | POA: Diagnosis not present

## 2021-08-17 DIAGNOSIS — I639 Cerebral infarction, unspecified: Secondary | ICD-10-CM | POA: Diagnosis not present

## 2021-08-17 DIAGNOSIS — R079 Chest pain, unspecified: Secondary | ICD-10-CM | POA: Diagnosis not present

## 2021-08-17 DIAGNOSIS — I959 Hypotension, unspecified: Secondary | ICD-10-CM | POA: Diagnosis not present

## 2021-08-17 DIAGNOSIS — E1140 Type 2 diabetes mellitus with diabetic neuropathy, unspecified: Secondary | ICD-10-CM | POA: Diagnosis present

## 2021-08-17 DIAGNOSIS — K50018 Crohn's disease of small intestine with other complication: Secondary | ICD-10-CM | POA: Diagnosis not present

## 2021-08-17 DIAGNOSIS — Z66 Do not resuscitate: Secondary | ICD-10-CM | POA: Diagnosis present

## 2021-08-17 DIAGNOSIS — E876 Hypokalemia: Secondary | ICD-10-CM | POA: Diagnosis not present

## 2021-08-17 DIAGNOSIS — I63442 Cerebral infarction due to embolism of left cerebellar artery: Secondary | ICD-10-CM | POA: Diagnosis not present

## 2021-08-17 DIAGNOSIS — E872 Acidosis, unspecified: Secondary | ICD-10-CM | POA: Diagnosis present

## 2021-08-17 DIAGNOSIS — D696 Thrombocytopenia, unspecified: Secondary | ICD-10-CM | POA: Diagnosis not present

## 2021-08-17 DIAGNOSIS — N179 Acute kidney failure, unspecified: Secondary | ICD-10-CM | POA: Diagnosis present

## 2021-08-17 DIAGNOSIS — I6340 Cerebral infarction due to embolism of unspecified cerebral artery: Secondary | ICD-10-CM | POA: Diagnosis not present

## 2021-08-17 DIAGNOSIS — E538 Deficiency of other specified B group vitamins: Secondary | ICD-10-CM | POA: Diagnosis present

## 2021-08-17 DIAGNOSIS — H538 Other visual disturbances: Secondary | ICD-10-CM | POA: Diagnosis not present

## 2021-08-17 DIAGNOSIS — J9621 Acute and chronic respiratory failure with hypoxia: Secondary | ICD-10-CM | POA: Diagnosis not present

## 2021-08-17 DIAGNOSIS — N1832 Chronic kidney disease, stage 3b: Secondary | ICD-10-CM | POA: Diagnosis present

## 2021-08-17 DIAGNOSIS — I5033 Acute on chronic diastolic (congestive) heart failure: Secondary | ICD-10-CM | POA: Diagnosis present

## 2021-08-17 DIAGNOSIS — I161 Hypertensive emergency: Secondary | ICD-10-CM | POA: Diagnosis present

## 2021-08-17 DIAGNOSIS — E1122 Type 2 diabetes mellitus with diabetic chronic kidney disease: Secondary | ICD-10-CM | POA: Diagnosis present

## 2021-08-17 DIAGNOSIS — L89311 Pressure ulcer of right buttock, stage 1: Secondary | ICD-10-CM | POA: Diagnosis present

## 2021-08-17 DIAGNOSIS — F05 Delirium due to known physiological condition: Secondary | ICD-10-CM | POA: Diagnosis not present

## 2021-08-17 DIAGNOSIS — G928 Other toxic encephalopathy: Secondary | ICD-10-CM | POA: Diagnosis not present

## 2021-08-17 DIAGNOSIS — R519 Headache, unspecified: Secondary | ICD-10-CM | POA: Diagnosis present

## 2021-08-17 DIAGNOSIS — N189 Chronic kidney disease, unspecified: Secondary | ICD-10-CM | POA: Diagnosis not present

## 2021-08-17 DIAGNOSIS — I130 Hypertensive heart and chronic kidney disease with heart failure and stage 1 through stage 4 chronic kidney disease, or unspecified chronic kidney disease: Secondary | ICD-10-CM | POA: Diagnosis present

## 2021-08-17 DIAGNOSIS — J9601 Acute respiratory failure with hypoxia: Secondary | ICD-10-CM | POA: Diagnosis present

## 2021-08-17 DIAGNOSIS — E871 Hypo-osmolality and hyponatremia: Secondary | ICD-10-CM | POA: Diagnosis present

## 2021-08-17 LAB — BASIC METABOLIC PANEL
Anion gap: 11 (ref 5–15)
BUN: 58 mg/dL — ABNORMAL HIGH (ref 8–23)
CO2: 17 mmol/L — ABNORMAL LOW (ref 22–32)
Calcium: 6.5 mg/dL — ABNORMAL LOW (ref 8.9–10.3)
Chloride: 109 mmol/L (ref 98–111)
Creatinine, Ser: 3.21 mg/dL — ABNORMAL HIGH (ref 0.44–1.00)
GFR, Estimated: 15 mL/min — ABNORMAL LOW (ref >60)
Glucose, Bld: 221 mg/dL — ABNORMAL HIGH (ref 70–99)
Potassium: 4.9 mmol/L (ref 3.5–5.1)
Sodium: 137 mmol/L (ref 135–145)

## 2021-08-17 LAB — GLUCOSE, CAPILLARY
Glucose-Capillary: 199 mg/dL — ABNORMAL HIGH (ref 70–99)
Glucose-Capillary: 177 mg/dL — ABNORMAL HIGH (ref 70–99)
Glucose-Capillary: 236 mg/dL — ABNORMAL HIGH (ref 70–99)
Glucose-Capillary: 122 mg/dL — ABNORMAL HIGH (ref 70–99)

## 2021-08-17 LAB — URINALYSIS, ROUTINE W REFLEX MICROSCOPIC
Bacteria, UA: NONE SEEN
Bilirubin Urine: NEGATIVE
Glucose, UA: NEGATIVE mg/dL
Hgb urine dipstick: NEGATIVE
Ketones, ur: NEGATIVE mg/dL
Leukocytes,Ua: NEGATIVE
Nitrite: NEGATIVE
Protein, ur: 100 mg/dL — AB
Specific Gravity, Urine: 1.013 (ref 1.005–1.030)
pH: 5 (ref 5.0–8.0)

## 2021-08-17 LAB — CBC
HCT: 29.5 % — ABNORMAL LOW (ref 36.0–46.0)
Hemoglobin: 9.1 g/dL — ABNORMAL LOW (ref 12.0–15.0)
MCH: 31 pg (ref 26.0–34.0)
MCHC: 30.8 g/dL (ref 30.0–36.0)
MCV: 100.3 fL — ABNORMAL HIGH (ref 80.0–100.0)
Platelets: 187 10*3/uL (ref 150–400)
RBC: 2.94 MIL/uL — ABNORMAL LOW (ref 3.87–5.11)
RDW: 15.5 % (ref 11.5–15.5)
WBC: 9 10*3/uL (ref 4.0–10.5)
nRBC: 0 % (ref 0.0–0.2)

## 2021-08-17 LAB — MAGNESIUM: Magnesium: 2.1 mg/dL (ref 1.7–2.4)

## 2021-08-17 LAB — HIV ANTIBODY (ROUTINE TESTING W REFLEX): HIV Screen 4th Generation wRfx: NONREACTIVE

## 2021-08-17 LAB — TSH: TSH: 3.426 u[IU]/mL (ref 0.350–4.500)

## 2021-08-17 MED ORDER — HYDROMORPHONE HCL 1 MG/ML IJ SOLN: 0.5000 mg | Freq: Once | INTRAMUSCULAR | Status: DC

## 2021-08-17 MED ORDER — HYDROMORPHONE HCL 1 MG/ML IJ SOLN
1.0000 mg | Freq: Once | INTRAMUSCULAR | Status: AC
  Administered 2021-08-17: 1 mg via INTRAVENOUS
  Filled 2021-08-17: qty 1

## 2021-08-17 MED ORDER — OXYCODONE HCL 5 MG PO TABS
5.0000 mg | ORAL_TABLET | Freq: Four times a day (QID) | ORAL | Status: DC | PRN
Start: 2021-08-17 — End: 2021-08-17
  Administered 2021-08-17: 5 mg via ORAL
  Filled 2021-08-17: qty 1

## 2021-08-17 MED ORDER — OXYCODONE-ACETAMINOPHEN 5-325 MG PO TABS
1.0000 | ORAL_TABLET | Freq: Once | ORAL | Status: DC
Start: 2021-08-17 — End: 2021-08-17

## 2021-08-17 MED ORDER — DIPHENHYDRAMINE HCL 25 MG PO CAPS
25.0000 mg | ORAL_CAPSULE | Freq: Four times a day (QID) | ORAL | Status: DC | PRN
  Administered 2021-08-17 – 2021-09-04 (×16): 25 mg via ORAL
  Filled 2021-08-17 (×16): qty 1

## 2021-08-17 MED ORDER — HYDROMORPHONE HCL 2 MG PO TABS
1.0000 mg | ORAL_TABLET | ORAL | Status: DC | PRN
  Administered 2021-08-17 – 2021-08-18 (×3): 1 mg via ORAL
  Filled 2021-08-17 (×4): qty 1

## 2021-08-17 MED ORDER — METHOCARBAMOL 500 MG PO TABS
500.0000 mg | ORAL_TABLET | Freq: Four times a day (QID) | ORAL | Status: DC | PRN
  Administered 2021-08-17 – 2021-08-19 (×3): 500 mg via ORAL
  Filled 2021-08-17 (×3): qty 1

## 2021-08-17 MED ORDER — MECLIZINE HCL 25 MG PO TABS
25.0000 mg | ORAL_TABLET | ORAL | Status: DC | PRN
  Administered 2021-08-19: 25 mg via ORAL
  Filled 2021-08-17: qty 2
  Filled 2021-08-17: qty 1

## 2021-08-17 MED ORDER — DIPHENOXYLATE-ATROPINE 2.5-0.025 MG PO TABS
1.0000 | ORAL_TABLET | Freq: Four times a day (QID) | ORAL | Status: DC | PRN
  Administered 2021-08-29: 1 via ORAL
  Filled 2021-08-17: qty 1

## 2021-08-17 MED ORDER — DICYCLOMINE HCL 10 MG PO CAPS
20.0000 mg | ORAL_CAPSULE | Freq: Three times a day (TID) | ORAL | Status: DC
  Administered 2021-08-17 – 2021-08-25 (×15): 20 mg via ORAL
  Filled 2021-08-17 (×16): qty 2

## 2021-08-17 MED ORDER — DICYCLOMINE HCL 20 MG PO TABS
20.0000 mg | ORAL_TABLET | Freq: Three times a day (TID) | ORAL | Status: DC
  Filled 2021-08-17 (×3): qty 1

## 2021-08-17 MED ORDER — GABAPENTIN 300 MG PO CAPS
300.0000 mg | ORAL_CAPSULE | Freq: Two times a day (BID) | ORAL | Status: DC
  Administered 2021-08-17 – 2021-08-25 (×12): 300 mg via ORAL
  Filled 2021-08-17 (×14): qty 1

## 2021-08-17 MED ORDER — INSULIN DETEMIR 100 UNIT/ML ~~LOC~~ SOLN
15.0000 [IU] | Freq: Every day | SUBCUTANEOUS | Status: DC
  Administered 2021-08-17 – 2021-08-30 (×14): 15 [IU] via SUBCUTANEOUS
  Filled 2021-08-17 (×17): qty 0.15

## 2021-08-17 NOTE — Progress Notes (Signed)
Inpatient Diabetes Program Recommendations  AACE/ADA: New Consensus Statement on Inpatient Glycemic Control   Target Ranges:  Prepandial:   less than 140 mg/dL      Peak postprandial:   less than 180 mg/dL (1-2 hours)      Critically ill patients:  140 - 180 mg/dL  Results for Heather [name], Heather A "[name]" (MRN [id number]) as of 08/17/2021 07:59  Ref. Range 08/17/2021 05:42  Glucose Latest Ref Range: 70 - 99 mg/dL 221 (H)   Results for Heather [name], Heather A "[name]" (MRN [id number]) as of 08/17/2021 07:59  Ref. Range 08/16/2021 11:04 08/16/2021 13:44 08/16/2021 16:40 08/16/2021 21:46  Glucose-Capillary Latest Ref Range: 70 - 99 mg/dL 89 126 (H) 100 (H) 193 (H)   Results for Heather [name], Heather A "[name]" (MRN [id number]) as of 08/17/2021 07:59  Ref. Range 08/16/2021 12:06  Glucose Latest Ref Range: 70 - 99 mg/dL 43 (LL)  Results for Heather [name], Heather A "[name]" (MRN [id number]) as of 08/17/2021 07:59  Ref. Range 01/06/2021 21:05 04/26/2021 14:13 07/12/2021 00:00  Hemoglobin A1C Unknown 11.0 (H) 12.3 (A) 7.0   Review of Glycemic Control  Diabetes history: DM2 Outpatient Diabetes medications: Levemir 70 units QHS, Humalog 20-26 units TID with meals Current orders for Inpatient glycemic control: Novolog 0-9 units TID with meals  Inpatient Diabetes Program Recommendations:    Insulin: Please consider ordering Levemir 14 units Q24H (based on 68 kg x 0.2 units) and adding Novolog 0-5 units QHS for bedtime correction.  Note: In reviewing chart, noted patient seen Rayetta Pigg, NP (with [facility]) on 08/03/21. Per office visit note on 08/03/21, "Given her drastic improvement and tightening of her fasting glycemic profile, she will tolerate slight reduction in her Levemir to 70 units SQ nightly.  She can also lower her Humalog to 20-26 units TID with meals if glucose is above 90 and she is eating."   Thanks, Barnie Alderman, RN, MSN, CDE Diabetes Coordinator Inpatient Diabetes  Program [phone] (Team Pager from 8am to 5pm)

## 2021-08-17 NOTE — Consult Note (Signed)
Harts A. Merlene Laughter, MD     [email or web address]          Heather [name] is an 70 y.o. female.   ASSESSMENT/PLAN: ACUTE DIPLOPIA AND HEADACHES OF UNCLEAR ETIOLOGY:  Typical etiologies such as brainstem infarcts, myasthenia gravis and thyroid eye disease seems unlikely. The patient does have severe hypomagnesemia which I suspect to be the etiology. Bilateral subacute symmetric encephalomalacia involving the globus pallidus. Think this is also due to severe hypomagnesemia. Other typical causes such as, monoxide poisoning or cyanide poisoning seems doubtful. Chronic vitamin B12 diffusion she although she is being replaced could be aggravated with low magnesium. Additional labs will be done. Muscle relaxer will be given for pain headache/neck pain.       The patient reports having the relative acute onset her sentinel diplopia. This was associated with significant /severe posterior headaches and neck pain. She decided to seek medical attention. The patient does not report having focal numbness or weakness. She denies any loss of consciousness loss of vision but her vision has been blurry due to the diplopia. She denies any dysarthria dysphagia. She has been prescribed antibiotic recently and the seemingly have developed a rash from the antibiotics.  The rash is not associated with itching. The medication was stopped on admission. She reports having some right shoulder pain at times but this has been a chronic issue along with episodic dry cough at times. The review systems otherwise negative.    Blood pressure (!) 94/42, pulse 76, temperature 98.3 F (36.8 C), resp. rate 18, height 5' 2.99" (1.6 m), weight 68 kg, SpO2 100 %.  GENERAL:  She is doing well at this time.  HEENT:  Increased tone/spasm of the cervical paraspinous muscles bilaterally. Midline incisional scar cervical spine.  ABDOMEN: soft  EXTREMITIES: No edema   BACK:  Status post extensive scarring  of the low back region from surgery.  SKIN:  Multiple macules the mostly small /dots running call involving notes from the arms and legs    MENTAL STATUS: Alert and oriented. Speech, language and cognition are generally intact. Judgment and insight normal.   CRANIAL NERVES: Pupils are equal, round and reactive to light and accomodation; extra ocular movements are full, there is no significant nystagmus; visual fields are full; upper and lower facial muscles are normal in strength and symmetric, there is no flattening of the nasolabial folds; tongue is midline; uvula is midline; shoulder elevation is normal.  MOTOR: Normal tone, bulk and strength; no pronator drift.  COORDINATION: Left finger to nose is normal, right finger to nose is normal, No rest tremor; no intention tremor; no postural tremor; no bradykinesia.  REFLEXES: Deep tendon reflexes are symmetrical and normal. Plantar reflexes are flexor L and chronically extensor on the right.   SENSATION: Normal to light touch, temperature, and pain.    Past Medical History:  Diagnosis Date   Acute renal failure superimposed on stage 3 chronic kidney disease ([place]) 01/21/2016   Anemia    Anxiety    B12 deficiency    Brain tumor (benign) ([place]) 10/29/2020   Cellulitis and abscess    Abdomen and buttocks   CKD (chronic kidney disease) stage 3, GFR 30-59 ml/min (HCC)    Closed fracture of distal end of femur, unspecified fracture morphology, initial encounter ([place])    Depression    Diabetic neuropathy (HCC)    feet   DJD (degenerative joint disease)    Right forminal stenosis C4-5   Essential hypertension  Femur fracture, right ([place]) 01/30/101   Folliculitis    GERD (gastroesophageal reflux disease)    Gout    Headache(784.0)    Hiatal hernia    History of blood transfusion    History of bronchitis    History of cardiac catheterization    No significant CAD 2012   History of kidney stones    surgery to remove   Insomnia     Irritable bowel syndrome    Mixed hyperlipidemia    Osteoarthritis    Palpitations    Pneumonia    Reflux esophagitis    Salmonella    Stroke [facility])    Tinnitus    Right   Type 2 diabetes mellitus [facility])     Past Surgical History:  Procedure Laterality Date   BACK SURGERY     c-spine surgery     03/2007   CARDIAC CATHETERIZATION     CHOLECYSTECTOMY     COLONOSCOPY  04/21/2011; 05/29/11   6/12: [place] - ?AVM at IC valve, inflammatory changes at IC valve [facility]); 7/12 - [place]; IC valve erosions, look chronic and probably Crohn's per path   colonoscopy  2017   [place]: random biopsies normal. TI normal   ESOPHAGOGASTRODUODENOSCOPY  05/29/11   normal   ESOPHAGOGASTRODUODENOSCOPY N/A 01/21/2016   Dr. Michail Sermon: normal   EYE SURGERY Bilateral    cataracts removed   FEMUR IM NAIL Right 04/10/2018   Procedure: INTRAMEDULLARY (IM) NAIL FEMORAL;  Surgeon: Paralee Cancel, MD;  Location: [facility];  Service: Orthopedics;  Laterality: Right;   FRACTURE SURGERY     GIVENS CAPSULE STUDY  11/17/2020   HARDWARE REMOVAL Right 04/10/2018   Procedure: HARDWARE REMOVAL RIGHT DISTAL FEMUR;  Surgeon: Paralee Cancel, MD;  Location: [facility];  Service: Orthopedics;  Laterality: Right;   HERNIA REPAIR     IR FLUORO GUIDE CV LINE RIGHT  04/01/2017   IR US GUIDE VASC ACCESS RIGHT  04/01/2017   JOINT REPLACEMENT Right    hip   OPEN REDUCTION INTERNAL FIXATION (ORIF) DISTAL RADIAL FRACTURE Right 05/28/2019   Procedure: OPEN REDUCTION INTERNAL FIXATION (ORIF) DISTAL RADIAL FRACTURE;  Surgeon: Verner Mould, MD;  Location: [place];  Service: Orthopedics;  Laterality: Right;  with block 90 minutes   ORIF FEMUR FRACTURE Right 03/31/2017   Procedure: OPEN REDUCTION INTERNAL FIXATION (ORIF) DISTAL FEMUR FRACTURE;  Surgeon: Paralee Cancel, MD;  Location: [place];  Service: Orthopedics;  Laterality: Right;   removal of kidney stone     Right knee arthroscopic surgery     TONSILLECTOMY     TOTAL ABDOMINAL  HYSTERECTOMY      Family History  Problem Relation Age of Onset   Diabetes Mother    Colon cancer Brother        POSSIBLY. Patient states diagnosed at age 49, then later states age 19. unclear and limited historian   Esophageal cancer Neg Hx    Rectal cancer Neg Hx    Stomach cancer Neg Hx     Social History:  reports that she has never smoked. She has never used smokeless tobacco. She reports that she does not drink alcohol and does not use drugs.  Allergies:  Allergies  Allergen Reactions   Duloxetine Hcl Swelling   Fluocinolone Swelling   Acetaminophen Itching and Swelling    Pt states "some swelling"   Amlodipine Besylate Swelling    Swelling of feet, fluid retention   Ciprofloxacin Nausea And Vomiting and Swelling    Swelling of  face, jaw, and lips   Hydrocodone-Acetaminophen Itching   Metformin Other (See Comments)    REACTION: GI UPSET   Nsaids Other (See Comments)    "HAS BLEEDING ULCERS"   Quinolones Swelling   Sulfamethoxazole Swelling    Swelling of feet, legs   Valsartan Swelling   Cozaar [Losartan] Swelling and Rash   Lisinopril Swelling and Rash    Oral swelling, and red streaks on arms/stomach   Tape Itching and Rash    Paper tape can only be used on this patient    Medications: Prior to Admission medications   Medication Sig Start Date End Date Taking? Authorizing Provider  acetaminophen (TYLENOL) 650 MG CR tablet Take 650 mg by mouth every 8 (eight) hours as needed for pain.   Yes [provider]  Adalimumab (HUMIRA PEN) 40 MG/0.4ML PNKT Inject 40 mg into the skin every 14 (fourteen) days. 08/15/21  Yes Gatha Mayer, MD  albuterol (VENTOLIN HFA) 108 (90 Base) MCG/ACT inhaler Inhale 1-2 puffs into the lungs every 6 (six) hours as needed for shortness of breath or wheezing. 02/16/21 02/16/22 Yes [provider]  allopurinol (ZYLOPRIM) 100 MG tablet Take 100 mg by mouth daily. 03/20/21  Yes [provider]  Ascorbic Acid  (VITAMIN C WITH ROSE HIPS) 500 MG tablet Take 500 mg by mouth every morning.   Yes [provider]  atorvastatin (LIPITOR) 80 MG tablet Take 80 mg by mouth every morning.   Yes [provider]  Cholecalciferol (VITAMIN D) 2000 units CAPS Take 2,000 Units by mouth every morning.    Yes [provider]  citalopram (CELEXA) 20 MG tablet Take 1 tablet (20 mg total) by mouth daily. 01/12/21  Yes Barton Dubois, MD  cloNIDine (CATAPRES) 0.2 MG tablet Take 0.2 mg by mouth 2 (two) times daily.   Yes [provider]  cyanocobalamin (,VITAMIN B-12,) 1000 MCG/ML injection Inject 1,000 mcg into the muscle every 30 (thirty) days.     Yes [provider]  dicyclomine (BENTYL) 20 MG tablet Take 1 tablet (20 mg total) by mouth 3 (three) times daily before meals. Patient taking differently: Take 20 mg by mouth in the morning and at bedtime. 01/12/21  Yes Barton Dubois, MD  epoetin alfa-epbx (RETACRIT) 3000 UNIT/ML injection Inject 3,000 Units into the skin every 14 (fourteen) days. 11/21/20  Yes Bhutani, Manpreet S, MD  esomeprazole (NEXIUM) 40 MG capsule Take 40 mg by mouth daily before breakfast.     Yes [provider]  fenofibrate (TRICOR) 145 MG tablet Take 1 tablet (145 mg total) by mouth daily. 09/07/20  Yes BranchAlphonse Guild, MD  furosemide (LASIX) 40 MG tablet Take 40 mg by mouth in the morning, at noon, and at bedtime.   Yes [provider]  gabapentin (NEURONTIN) 300 MG capsule Take 300 mg by mouth in the morning and at bedtime. *May take one additional capsule as needed for pain 10/20/20  Yes [provider]  HYDROmorphone (DILAUDID) 2 MG tablet Take 0.5 tablets (1 mg total) by mouth every 4 (four) hours as needed for severe pain. 05/18/21  Yes Isla Pence, MD  indomethacin (INDOCIN) 50 MG capsule Take 50 mg by mouth 3 (three) times daily. 07/05/21  Yes [provider]  insulin detemir (LEVEMIR FLEXTOUCH) 100 UNIT/ML FlexPen  Inject 70 Units into the skin at bedtime. 08/03/21  Yes Brita Romp, NP  Insulin Lispro (HUMALOG KWIKPEN [place]) Inject 20-26 Units into the skin 3 (three) times daily with  meals. Sliding scale   Yes [provider]  lipase/protease/amylase (CREON) 36000 UNITS CPEP capsule Take 1 capsule (36,000 Units total) by mouth with snacks. Patient taking differently: Take 36,000-72,000 Units by mouth See admin instructions. Takes 2 capsules with all meals and takes one capsule with snacks 01/12/21  Yes Barton Dubois, MD  meclizine (ANTIVERT) 25 MG tablet Take 1 tablet by mouth as needed for dizziness or nausea.   Yes [provider]  metoprolol tartrate (LOPRESSOR) 100 MG tablet Take 100 mg by mouth 2 (two) times daily. 05/07/21  Yes [provider]  omega-3 acid ethyl esters (LOVAZA) 1 g capsule Take 3 capsules by mouth 3 (three) times daily. 10/11/20  Yes [provider]  potassium chloride SA (K-DUR,KLOR-CON) 20 MEQ tablet Take 20 mEq by mouth every morning.   Yes [provider]  promethazine (PHENERGAN) 25 MG tablet Take 1 tablet (25 mg total) by mouth every 6 (six) hours as needed for nausea or vomiting. 05/18/21  Yes Isla Pence, MD  temazepam (RESTORIL) 15 MG capsule Take 15 mg by mouth at bedtime as needed for sleep. 04/14/21  Yes [provider]  tiZANidine (ZANAFLEX) 4 MG tablet Take 8 mg by mouth at bedtime. 09/06/20  Yes [provider]  venlafaxine XR (EFFEXOR-XR) 150 MG 24 hr capsule Take 150 mg by mouth daily. 04/27/21  Yes [provider]  Adalimumab (HUMIRA PEN) 40 MG/0.4ML PNKT Inject 40 mg into the skin every 14 days Patient not taking: No sig reported 08/15/21   Gatha Mayer, MD  diphenoxylate-atropine (LOMOTIL) 2.5-0.025 MG tablet 1-2 tablets qid prn diarrhea Instead of Imodium Patient not taking: No sig reported 12/06/20   Gatha Mayer, MD  hydrALAZINE (APRESOLINE) 25 MG tablet Take 1 tablet (25 mg total) by  mouth 3 (three) times daily. Patient not taking: No sig reported 05/18/21 08/16/21  Isla Pence, MD    Scheduled Meds:  allopurinol  100 mg Oral Daily   atorvastatin  80 mg Oral q morning   citalopram  20 mg Oral Daily   cloNIDine  0.2 mg Oral BID   dicyclomine  20 mg Oral TID AC   gabapentin  300 mg Oral BID   heparin  5,000 Units Subcutaneous Q8H   hydrALAZINE  25 mg Oral Q12H   insulin aspart  0-9 Units Subcutaneous TID WC   insulin detemir  15 Units Subcutaneous QHS   lipase/protease/amylase  36,000-72,000 Units Oral See admin instructions   metoprolol tartrate  100 mg Oral BID   pantoprazole  40 mg Oral Daily   potassium chloride SA  20 mEq Oral q morning   venlafaxine XR  150 mg Oral Daily   Continuous Infusions:  sodium chloride 101 mL/hr at 08/17/21 1535   PRN Meds:.diphenhydrAMINE, diphenoxylate-atropine, HYDROmorphone, meclizine, ondansetron **OR** ondansetron (ZOFRAN) IV, temazepam     Results for orders placed or performed during the hospital encounter of 08/16/21 (from the past 48 hour(s))  POC CBG, ED     Status: None   Collection Time: 08/16/21 11:04 AM  Result Value Ref Range   Glucose-Capillary 89 70 - 99 mg/dL    Comment: Glucose reference range applies only to samples taken after fasting for at least 8 hours.  CBC with Differential/Platelet     Status: Abnormal   Collection Time: 08/16/21 12:06 PM  Result Value Ref Range   WBC 9.4 4.0 - 10.5 K/uL   RBC 3.17 (L) 3.87 - 5.11 MIL/uL   Hemoglobin 9.7 (L)  12.0 - 15.0 g/dL   HCT 30.4 (L) 36.0 - 46.0 %   MCV 95.9 80.0 - 100.0 fL   MCH 30.6 26.0 - 34.0 pg   MCHC 31.9 30.0 - 36.0 g/dL   RDW 15.2 11.5 - 15.5 %   Platelets 216 150 - 400 K/uL   nRBC 0.0 0.0 - 0.2 %   Neutrophils Relative % 71 %   Neutro Abs 6.6 1.7 - 7.7 K/uL   Lymphocytes Relative 18 %   Lymphs Abs 1.6 0.7 - 4.0 K/uL   Monocytes Relative 7 %   Monocytes Absolute 0.7 0.1 - 1.0 K/uL   Eosinophils Relative 2 %   Eosinophils Absolute 0.2  0.0 - 0.5 K/uL   Basophils Relative 1 %   Basophils Absolute 0.1 0.0 - 0.1 K/uL   Immature Granulocytes 1 %   Abs Immature Granulocytes 0.12 (H) 0.00 - 0.07 K/uL    Comment: Performed at [facility], [address] [place], [place] [address]  Comprehensive metabolic panel     Status: Abnormal   Collection Time: 08/16/21 12:06 PM  Result Value Ref Range   Sodium 139 135 - 145 mmol/L   Potassium 3.1 (L) 3.5 - 5.1 mmol/L   Chloride 106 98 - 111 mmol/L   CO2 21 (L) 22 - 32 mmol/L   Glucose, Bld 43 (LL) 70 - 99 mg/dL    Comment: Glucose reference range applies only to samples taken after fasting for at least 8 hours. CRITICAL RESULT CALLED TO, READ BACK BY AND VERIFIED WITH: B OAKLEY RN [id number] K FORSYTH    BUN 56 (H) 8 - 23 mg/dL   Creatinine, Ser 3.23 (H) 0.44 - 1.00 mg/dL   Calcium 6.5 (L) 8.9 - 10.3 mg/dL   Total Protein 6.9 6.5 - 8.1 g/dL   Albumin 3.4 (L) 3.5 - 5.0 g/dL   AST 21 15 - 41 U/L   ALT 17 0 - 44 U/L   Alkaline Phosphatase 57 38 - 126 U/L   Total Bilirubin 1.0 0.3 - 1.2 mg/dL   GFR, Estimated 15 (L) >60 mL/min    Comment: (NOTE) Calculated using the CKD-EPI Creatinine Equation (2021)    Anion gap 12 5 - 15    Comment: Performed at [facility], [address] [place], [place] [address]  Magnesium     Status: Abnormal   Collection Time: 08/16/21 12:06 PM  Result Value Ref Range   Magnesium 0.7 (LL) 1.7 - 2.4 mg/dL    Comment: CRITICAL RESULT CALLED TO, READ BACK BY AND VERIFIED WITH: PANTHER,Z ON 08/16/21 AT 2200 BY LOY,C Performed at [facility], [address], [place], [place] [address]   CBG monitoring, ED     Status: Abnormal   Collection Time: 08/16/21  1:44 PM  Result Value Ref Range   Glucose-Capillary 126 (H) 70 - 99 mg/dL    Comment: Glucose reference range applies only to samples taken after fasting for at least 8 hours.  POC CBG, ED     Status: Abnormal   Collection Time: 08/16/21  4:40 PM  Result Value Ref Range   Glucose-Capillary 100 (H)  70 - 99 mg/dL    Comment: Glucose reference range applies only to samples taken after fasting for at least 8 hours.  Resp Panel by RT-PCR (Flu A&B, Covid) Nasopharyngeal Swab     Status: None   Collection Time: 08/16/21  8:28 PM   Specimen: Nasopharyngeal Swab; Nasopharyngeal(NP) swabs in vial transport medium  Result Value  Ref Range   SARS Coronavirus 2 by RT PCR NEGATIVE NEGATIVE    Comment: (NOTE) SARS-CoV-2 target nucleic acids are NOT DETECTED.  The SARS-CoV-2 RNA is generally detectable in upper respiratory specimens during the acute phase of infection. The lowest concentration of SARS-CoV-2 viral copies this assay can detect is 138 copies/mL. A negative result does not preclude SARS-Cov-2 infection and should not be used as the sole basis for treatment or other patient management decisions. A negative result may occur with  improper specimen collection/handling, submission of specimen other than nasopharyngeal swab, presence of viral mutation(s) within the areas targeted by this assay, and inadequate number of viral copies(<138 copies/mL). A negative result must be combined with clinical observations, patient history, and epidemiological information. The expected result is Negative.  Fact Sheet for Patients:  EntrepreneurPulse.com.au  Fact Sheet for Healthcare Providers:  IncredibleEmployment.be  This test is no t yet approved or cleared by the [place] FDA and  has been authorized for detection and/or diagnosis of SARS-CoV-2 by FDA under an Emergency Use Authorization (EUA). This EUA will remain  in effect (meaning this test can be used) for the duration of the COVID-19 declaration under Section 564(b)(1) of the Act, 21 U.S.C.section 360bbb-3(b)(1), unless the authorization is terminated  or revoked sooner.       Influenza A by PCR NEGATIVE NEGATIVE   Influenza B by PCR NEGATIVE NEGATIVE    Comment: (NOTE) The Xpert Xpress  SARS-CoV-2/FLU/RSV plus assay is intended as an aid in the diagnosis of influenza from Nasopharyngeal swab specimens and should not be used as a sole basis for treatment. Nasal washings and aspirates are unacceptable for Xpert Xpress SARS-CoV-2/FLU/RSV testing.  Fact Sheet for Patients: EntrepreneurPulse.com.au  Fact Sheet for Healthcare Providers: IncredibleEmployment.be  This test is not yet approved or cleared by the [place] FDA and has been authorized for detection and/or diagnosis of SARS-CoV-2 by FDA under an Emergency Use Authorization (EUA). This EUA will remain in effect (meaning this test can be used) for the duration of the COVID-19 declaration under Section 564(b)(1) of the Act, 21 U.S.C. section 360bbb-3(b)(1), unless the authorization is terminated or revoked.  Performed at [facility], [address], [place], [place] [address]   Glucose, capillary     Status: Abnormal   Collection Time: 08/16/21  9:46 PM  Result Value Ref Range   Glucose-Capillary 193 (H) 70 - 99 mg/dL    Comment: Glucose reference range applies only to samples taken after fasting for at least 8 hours.  HIV Antibody (routine testing w rflx)     Status: None   Collection Time: 08/17/21  5:42 AM  Result Value Ref Range   HIV Screen 4th Generation wRfx Non Reactive Non Reactive    Comment: Performed at [facility], [place] [address] [place], [place] [address]  Basic metabolic panel     Status: Abnormal   Collection Time: 08/17/21  5:42 AM  Result Value Ref Range   Sodium 137 135 - 145 mmol/L   Potassium 4.9 3.5 - 5.1 mmol/L    Comment: DELTA CHECK NOTED   Chloride 109 98 - 111 mmol/L   CO2 17 (L) 22 - 32 mmol/L   Glucose, Bld 221 (H) 70 - 99 mg/dL    Comment: Glucose reference range applies only to samples taken after fasting for at least 8 hours.   BUN 58 (H) 8 - 23 mg/dL   Creatinine, Ser 3.21 (H) 0.44 - 1.00 mg/dL   Calcium  6.5 (L) 8.9 - 10.3  mg/dL   GFR, Estimated 15 (L) >60 mL/min    Comment: (NOTE) Calculated using the CKD-EPI Creatinine Equation (2021)    Anion gap 11 5 - 15    Comment: Performed at [facility], [address], [place], [place] [address]  CBC     Status: Abnormal   Collection Time: 08/17/21  5:42 AM  Result Value Ref Range   WBC 9.0 4.0 - 10.5 K/uL   RBC 2.94 (L) 3.87 - 5.11 MIL/uL   Hemoglobin 9.1 (L) 12.0 - 15.0 g/dL   HCT 29.5 (L) 36.0 - 46.0 %   MCV 100.3 (H) 80.0 - 100.0 fL   MCH 31.0 26.0 - 34.0 pg   MCHC 30.8 30.0 - 36.0 g/dL   RDW 15.5 11.5 - 15.5 %   Platelets 187 150 - 400 K/uL   nRBC 0.0 0.0 - 0.2 %    Comment: Performed at [facility], [address], [place], [place] [address]  Magnesium     Status: None   Collection Time: 08/17/21  5:42 AM  Result Value Ref Range   Magnesium 2.1 1.7 - 2.4 mg/dL    Comment: Performed at [facility], [address], [place], [place] [address]  Glucose, capillary     Status: Abnormal   Collection Time: 08/17/21  7:50 AM  Result Value Ref Range   Glucose-Capillary 199 (H) 70 - 99 mg/dL    Comment: Glucose reference range applies only to samples taken after fasting for at least 8 hours.  Urinalysis, Routine w reflex microscopic Urine, Clean Catch     Status: Abnormal   Collection Time: 08/17/21 11:00 AM  Result Value Ref Range   Color, Urine YELLOW YELLOW   APPearance CLEAR CLEAR   Specific Gravity, Urine 1.013 1.005 - 1.030   pH 5.0 5.0 - 8.0   Glucose, UA NEGATIVE NEGATIVE mg/dL   Hgb urine dipstick NEGATIVE NEGATIVE   Bilirubin Urine NEGATIVE NEGATIVE   Ketones, ur NEGATIVE NEGATIVE mg/dL   Protein, ur 100 (A) NEGATIVE mg/dL   Nitrite NEGATIVE NEGATIVE   Leukocytes,Ua NEGATIVE NEGATIVE   WBC, UA 0-5 0 - 5 WBC/hpf   Bacteria, UA NONE SEEN NONE SEEN   Mucus PRESENT    Hyaline Casts, UA PRESENT     Comment: Performed at [facility], [address], [place], [place] [address]  Glucose, capillary     Status: Abnormal   Collection Time:  08/17/21 11:49 AM  Result Value Ref Range   Glucose-Capillary 177 (H) 70 - 99 mg/dL    Comment: Glucose reference range applies only to samples taken after fasting for at least 8 hours.    Studies/Results:  BRAIN MRI  IMPRESSION: Mildly motion degraded exam.   No evidence of acute intracranial abnormality.   Small symmetric remote insults within the globus pallidus bilaterally, a new finding as compared to prior exams. These may reflect chronic lacunar infarcts or sequela of a remote toxic/metabolic insult.   Mild chronic small vessel ischemic changes within the cerebral white matter and pons, progressed.   Unchanged size of a 6 mm meningioma overlying the right frontal lobe.   Mild generalized parenchymal atrophy.   Small-volume fluid within the left mastoid air cells.      FINDINGS: MRA HEAD FINDINGS   Anterior circulation: Chronically small left ACA with suspected superimposed distal left A1 and proximal A2 stenosis, possibly severe. Right A1 ACA is patent without significant stenosis. Preserved flow related signal in more  distal ACAs bilaterally. Bilateral intracranial ICAs, MCAs are patent without proximal hemodynamically significant stenosis.   Posterior circulation: Bilateral intradural dural vertebral arteries, basilar artery and posterior cerebral arteries are patent without proximal hemodynamically significant stenosis. Similar left posterior communicating artery, which appears despite left posterior cerebral artery territory. Similar suspected small or stenotic left P1 PCA. No aneurysm identified.   Anatomic variants: Detailed above.   MRV HEAD FINDINGS   The in superior sagittal sinus, transverse and sigmoid sinuses, jugular bulbs, straight sinus, and visible deep cerebral veins are patent. The left transverse sinus is or narrowed.   IMPRESSION: 1. No proximal large vessel occlusion. 2. Chronically small left ACA with suspected distal left A1  and proximal A2 stenosis, possibly severe. A CTA could confirm and further characterize if clinically indicated. 3. No evidence of dural venous sinus thrombosis.     THE BRAIN MRI IS REVIEWED IN PERSON. No acute changes are noted. There is mild to moderate global atrophy and the moderate periventricular and deep white leukoencephalopathy consistent with microvascular changes including the involvement of the pontine region. There is small area of encephalomalacia involving the globus pallidum with surrounding increased signal on FLAIR. This abnormality was not seen on the scan July this year.    Neva Ramaswamy A. Merlene Laughter, M.D.  Diplomate, Tax adviser of Psychiatry and Neurology ( Neurology). 08/17/2021, 4:19 PM

## 2021-08-17 NOTE — Progress Notes (Signed)
PROGRESS NOTE    Heather [name]  ZCH:[id number] DOB: [birth year] DOA: 08/16/2021 PCP: [name], MD    Brief Narrative:  70 year old female with history of CKD stage IIIb, Crohn's disease and IBS with chronic diarrhea, hypertension, type 2 diabetes, BPPV who presented to the emergency room with blurry vision and diplopia for 2 days along with stabbing headache mostly on the back of her head.  She had urinary symptoms and was treated with doxycycline since last 7 days.  She also developed diffuse rash while taking it.  In the emergency room afebrile.  Heart rate normal.  Blood pressure systolic [phone].  Patient was given labetalol in the ER and blood pressure improved.  Neurology was consulted.  MRI of the brain and MRA/MRV was done and was essentially normal.  She was also found to be suffering with acute kidney injury.  Potassium was 3.1.  Magnesium was 0.7.  Blood sugar was 37.   Assessment & Plan:   Principal Problem:   Blurry vision, bilateral Active Problems:   Essential hypertension   Depression   Acute kidney injury superimposed on CKD ([place])   Crohn's disease of small intestine with other complication ([place])   Hypomagnesemia   Acute hypokalemia  Blurry vision with headache/hypertensive emergency: Initially treated with labetalol, blood pressure adequately improved.  Resume home medication including clonidine and metoprolol.  Patient also started on hydralazine 25 mg twice daily.  She had recently stopped taking that medication. MRI of the brain and MRA negative for any acute intracranial process.  No focal neurological deficit. Her neck pain and headache is probably from cervical spine, she does have a history of cervical spine fusion. Will continue blood pressure monitoring today, as needed antihypertensives to keep blood pressure less than 160.  Already stabilizing now. Symptomatic treatment for headache, she was on chronic opiates with Dilaudid that she will  resume. Mobilize.  Acute kidney injury on chronic kidney disease stage IIIb.  Record indicates baseline creatinine about 1.6-2.1. Patient does have history of IBS and diarrhea along with poor appetite.  This is probably prerenal. Continue maintenance IV fluid today.  Renal functions already improving.  Urine output is adequate. Holding indomethacin and Lasix.  Recheck tomorrow morning.  Electrolytes.  Severe electrolyte abnormalities. Hypomagnesemia.  Magnesium 0.7 on presentation due to chronic magnesium loss.  Replaced.  Aggressively.  Normalized.  Recheck level tomorrow morning. Hypokalemia.  Replace aggressively and recheck level tomorrow morning.  Also check phosphorus levels.  Type 2 diabetes, uncontrolled with hypoglycemia: In the setting of poor appetite and acute kidney injury.  Blood pressure is stabilized now.  Oral intake has a stabilized.  Blood sugars improved and more than 150.  She takes Levemir 70 units at night, start 15 units tonight.  Keep on sliding scale insulin.  Encourage oral intake.  Crohn's disease with chronic diarrhea: Previously on Lomotil, resume.  Patient is on Humira injections that she will resume as outpatient.  Also resume Bentyl.  Skin rashes: Primary cause unknown.  She started taking doxycycline before starting rashes.  She also had taken Diflucan.  Probably drug rashes.  Minor allergic reaction.  Called her primary care physician's office to verify her medications, she was treated for UTI, however cultures were not done.  Repeat urine analysis is normal.   DVT prophylaxis: heparin injection 5,000 Units Start: 08/16/21 2230   Code Status: Full code Family Communication: None Disposition Plan: Status is: Inpatient  Remains inpatient appropriate because: Severe electrolyte abnormalities, fluctuating blood sugars and  blood pressures.  Needing IV electrolyte replacements.         Consultants:  None  Procedures:  None  Antimicrobials:   None   Subjective: Patient seen and examined.  She is still with some neck pain and headache from her back of the head.  She complains of blurry vision which is slightly improved since yesterday.  Blood pressures better now. Complaints of chronic diarrhea but she has not eaten anything meaningful since yesterday so unsure how she is going to do it after eating.  Blood sugars are stabilized overnight.    Objective: Vitals:   08/16/21 2138 08/17/21 0000 08/17/21 0209 08/17/21 0515  BP: (!) 113/101  (!) 117/49 (!) 121/51  Pulse: 84  66 72  Resp: 20  20 18   Temp: 97.6 F (36.4 C)  (!) 97.5 F (36.4 C) 97.6 F (36.4 C)  TempSrc: Oral  Oral Oral  SpO2: 100%  100% 100%  Weight:      Height:  5' 2.99" (1.6 m)      Intake/Output Summary (Last 24 hours) at 08/17/2021 1312 Last data filed at 08/17/2021 0900 Gross per 24 hour  Intake 924.98 ml  Output 351 ml  Net 573.98 ml   Filed Weights   08/16/21 1058  Weight: 68 kg    Examination:  General exam: Appears calm and comfortable , sitting in chair.  On room air. Respiratory system: Clear to auscultation. Respiratory effort normal.  No added sounds. Cardiovascular system: S1 & S2 heard, RRR. No JVD, murmurs, rubs, gallops or clicks. No pedal edema. Gastrointestinal system: Abdomen is nondistended, soft and nontender. No organomegaly or masses felt. Normal bowel sounds heard. Central nervous system: Alert and oriented. No focal neurological deficits. Extremities: Symmetric 5 x 5 power. Skin: No rashes, lesions or ulcers Psychiatry: Judgement and insight appear normal. Mood & affect anxious.    Data Reviewed: I have personally reviewed following labs and imaging studies  CBC: Recent Labs  Lab 08/16/21 1206 08/17/21 0542  WBC 9.4 9.0  NEUTROABS 6.6  --   HGB 9.7* 9.1*  HCT 30.4* 29.5*  MCV 95.9 100.3*  PLT 216 676   Basic Metabolic Panel: Recent Labs  Lab 08/16/21 1206 08/17/21 0542  NA 139 137  K 3.1* 4.9  CL  106 109  CO2 21* 17*  GLUCOSE 43* 221*  BUN 56* 58*  CREATININE 3.23* 3.21*  CALCIUM 6.5* 6.5*  MG 0.7* 2.1   GFR: Estimated Creatinine Clearance: 15.3 mL/min (A) (by C-G formula based on SCr of 3.21 mg/dL (H)). Liver Function Tests: Recent Labs  Lab 08/16/21 1206  AST 21  ALT 17  ALKPHOS 57  BILITOT 1.0  PROT 6.9  ALBUMIN 3.4*   No results for input(s): LIPASE, AMYLASE in the last 168 hours. No results for input(s): AMMONIA in the last 168 hours. Coagulation Profile: No results for input(s): INR, PROTIME in the last 168 hours. Cardiac Enzymes: No results for input(s): CKTOTAL, CKMB, CKMBINDEX, TROPONINI in the last 168 hours. BNP (last 3 results) No results for input(s): PROBNP in the last 8760 hours. HbA1C: No results for input(s): HGBA1C in the last 72 hours. CBG: Recent Labs  Lab 08/16/21 1344 08/16/21 1640 08/16/21 2146 08/17/21 0750 08/17/21 1149  GLUCAP 126* 100* 193* 199* 177*   Lipid Profile: No results for input(s): CHOL, HDL, LDLCALC, TRIG, CHOLHDL, LDLDIRECT in the last 72 hours. Thyroid Function Tests: No results for input(s): TSH, T4TOTAL, FREET4, T3FREE, THYROIDAB in the last 72 hours. Anemia Panel:  No results for input(s): VITAMINB12, FOLATE, FERRITIN, TIBC, IRON, RETICCTPCT in the last 72 hours. Sepsis Labs: No results for input(s): PROCALCITON, LATICACIDVEN in the last 168 hours.  Recent Results (from the past 240 hour(s))  Resp Panel by RT-PCR (Flu A&B, Covid) Nasopharyngeal Swab     Status: None   Collection Time: 08/16/21  8:28 PM   Specimen: Nasopharyngeal Swab; Nasopharyngeal(NP) swabs in vial transport medium  Result Value Ref Range Status   SARS Coronavirus 2 by RT PCR NEGATIVE NEGATIVE Final    Comment: (NOTE) SARS-CoV-2 target nucleic acids are NOT DETECTED.  The SARS-CoV-2 RNA is generally detectable in upper respiratory specimens during the acute phase of infection. The lowest concentration of SARS-CoV-2 viral copies this assay  can detect is 138 copies/mL. A negative result does not preclude SARS-Cov-2 infection and should not be used as the sole basis for treatment or other patient management decisions. A negative result may occur with  improper specimen collection/handling, submission of specimen other than nasopharyngeal swab, presence of viral mutation(s) within the areas targeted by this assay, and inadequate number of viral copies(<138 copies/mL). A negative result must be combined with clinical observations, patient history, and epidemiological information. The expected result is Negative.  Fact Sheet for Patients:  EntrepreneurPulse.com.au  Fact Sheet for Healthcare Providers:  IncredibleEmployment.be  This test is no t yet approved or cleared by the [place] FDA and  has been authorized for detection and/or diagnosis of SARS-CoV-2 by FDA under an Emergency Use Authorization (EUA). This EUA will remain  in effect (meaning this test can be used) for the duration of the COVID-19 declaration under Section 564(b)(1) of the Act, 21 U.S.C.section 360bbb-3(b)(1), unless the authorization is terminated  or revoked sooner.       Influenza A by PCR NEGATIVE NEGATIVE Final   Influenza B by PCR NEGATIVE NEGATIVE Final    Comment: (NOTE) The Xpert Xpress SARS-CoV-2/FLU/RSV plus assay is intended as an aid in the diagnosis of influenza from Nasopharyngeal swab specimens and should not be used as a sole basis for treatment. Nasal washings and aspirates are unacceptable for Xpert Xpress SARS-CoV-2/FLU/RSV testing.  Fact Sheet for Patients: EntrepreneurPulse.com.au  Fact Sheet for Healthcare Providers: IncredibleEmployment.be  This test is not yet approved or cleared by the [place] FDA and has been authorized for detection and/or diagnosis of SARS-CoV-2 by FDA under an Emergency Use Authorization (EUA). This EUA will  remain in effect (meaning this test can be used) for the duration of the COVID-19 declaration under Section 564(b)(1) of the Act, 21 U.S.C. section 360bbb-3(b)(1), unless the authorization is terminated or revoked.  Performed at [facility], [address] [place], [place] [address]          Radiology Studies: MR ANGIO HEAD WO CONTRAST  Result Date: 08/16/2021 CLINICAL DATA:  Diplopia; Vision loss, binocular diplopia EXAM: MRA HEAD WITHOUT CONTRAST MRV HEAD WITHOUT CONTRAST TECHNIQUE: Angiographic images of the Circle of Willis were acquired using MRA technique without intravenous contrast. Angiographic images of the intracranial venous structures were acquired using MRV technique without intravenous contrast. COMPARISON:  MRA July 21 21. FINDINGS: MRA HEAD FINDINGS Anterior circulation: Chronically small left ACA with suspected superimposed distal left A1 and proximal A2 stenosis, possibly severe. Right A1 ACA is patent without significant stenosis. Preserved flow related signal in more distal ACAs bilaterally. Bilateral intracranial ICAs, MCAs are patent without proximal hemodynamically significant stenosis. Posterior circulation: Bilateral intradural dural vertebral arteries, basilar artery and posterior cerebral arteries are patent without  proximal hemodynamically significant stenosis. Similar left posterior communicating artery, which appears despite left posterior cerebral artery territory. Similar suspected small or stenotic left P1 PCA. No aneurysm identified. Anatomic variants: Detailed above. MRV HEAD FINDINGS The in superior sagittal sinus, transverse and sigmoid sinuses, jugular bulbs, straight sinus, and visible deep cerebral veins are patent. The left transverse sinus is or narrowed. IMPRESSION: 1. No proximal large vessel occlusion. 2. Chronically small left ACA with suspected distal left A1 and proximal A2 stenosis, possibly severe. A CTA could confirm and further characterize if  clinically indicated. 3. No evidence of dural venous sinus thrombosis. Electronically Signed   By: Margaretha Sheffield M.D.   On: 08/16/2021 18:38   MR BRAIN WO CONTRAST  Result Date: 08/16/2021 CLINICAL DATA:  Headache, new or worsening. Additional history provided: Patient reports blurry vision for 2 days, worsening. Patient also reports occipital head pain which radiates down back. EXAM: MRI HEAD WITHOUT CONTRAST TECHNIQUE: Multiplanar, multiecho pulse sequences of the brain and surrounding structures were obtained without intravenous contrast. COMPARISON:  Prior brain MRI examinations 05/18/2021 and earlier FINDINGS: Brain: Mild intermittent motion degradation. Mild generalized cerebral and cerebellar atrophy. There are symmetric subcentimeter remote insults within the globus pallidus bilaterally, a new finding as compared to prior exams (series 15, image 24). These may reflect chronic lacunar infarcts or sequela of a toxic/metabolic insult. Mild multifocal T2 FLAIR hyperintense signal abnormality within the cerebral white matter and pons, progressed from the prior MRI of 05/17/2020. Unchanged 6 mm dural-based mass overlying the anterior right frontal lobe, consistent with a small incidental meningioma. There is no acute infarct. No extra-axial fluid collection. No midline shift. Vascular: Maintained flow voids within the proximal large arterial vessels. Skull and upper cervical spine: No focal suspicious marrow lesion. Susceptibility artifact arising from ACDF hardware. Sinuses/Orbits: Visualized orbits show no acute finding. Bilateral lens replacements. No significant paranasal sinus disease. Other: Small-volume fluid within the left mastoid air cells. IMPRESSION: Mildly motion degraded exam. No evidence of acute intracranial abnormality. Small symmetric remote insults within the globus pallidus bilaterally, a new finding as compared to prior exams. These may reflect chronic lacunar infarcts or sequela of a  remote toxic/metabolic insult. Mild chronic small vessel ischemic changes within the cerebral white matter and pons, progressed. Unchanged size of a 6 mm meningioma overlying the right frontal lobe. Mild generalized parenchymal atrophy. Small-volume fluid within the left mastoid air cells. Electronically Signed   By: Kellie Simmering D.O.   On: 08/16/2021 15:17   MR MRV HEAD WO CM  Result Date: 08/16/2021 CLINICAL DATA:  Diplopia; Vision loss, binocular diplopia EXAM: MRA HEAD WITHOUT CONTRAST MRV HEAD WITHOUT CONTRAST TECHNIQUE: Angiographic images of the Circle of Willis were acquired using MRA technique without intravenous contrast. Angiographic images of the intracranial venous structures were acquired using MRV technique without intravenous contrast. COMPARISON:  MRA July 21 21. FINDINGS: MRA HEAD FINDINGS Anterior circulation: Chronically small left ACA with suspected superimposed distal left A1 and proximal A2 stenosis, possibly severe. Right A1 ACA is patent without significant stenosis. Preserved flow related signal in more distal ACAs bilaterally. Bilateral intracranial ICAs, MCAs are patent without proximal hemodynamically significant stenosis. Posterior circulation: Bilateral intradural dural vertebral arteries, basilar artery and posterior cerebral arteries are patent without proximal hemodynamically significant stenosis. Similar left posterior communicating artery, which appears despite left posterior cerebral artery territory. Similar suspected small or stenotic left P1 PCA. No aneurysm identified. Anatomic variants: Detailed above. MRV HEAD FINDINGS The in superior sagittal sinus, transverse and  sigmoid sinuses, jugular bulbs, straight sinus, and visible deep cerebral veins are patent. The left transverse sinus is or narrowed. IMPRESSION: 1. No proximal large vessel occlusion. 2. Chronically small left ACA with suspected distal left A1 and proximal A2 stenosis, possibly severe. A CTA could confirm  and further characterize if clinically indicated. 3. No evidence of dural venous sinus thrombosis. Electronically Signed   By: Margaretha Sheffield M.D.   On: 08/16/2021 18:38        Scheduled Meds:  allopurinol  100 mg Oral Daily   atorvastatin  80 mg Oral q morning   citalopram  20 mg Oral Daily   cloNIDine  0.2 mg Oral BID   dicyclomine  20 mg Oral TID AC   gabapentin  300 mg Oral BID   heparin  5,000 Units Subcutaneous Q8H   hydrALAZINE  25 mg Oral Q12H   insulin aspart  0-9 Units Subcutaneous TID WC   insulin detemir  15 Units Subcutaneous QHS   lipase/protease/amylase  36,000-72,000 Units Oral See admin instructions   metoprolol tartrate  100 mg Oral BID   pantoprazole  40 mg Oral Daily   potassium chloride SA  20 mEq Oral q morning   venlafaxine XR  150 mg Oral Daily   Continuous Infusions:  sodium chloride 100 mL/hr at 08/17/21 0847     LOS: 0 days    Time spent: 35 minutes    Barb Merino, MD [facility] Pager [phone]

## 2021-08-18 ENCOUNTER — Inpatient Hospital Stay (HOSPITAL_COMMUNITY): Payer: 59

## 2021-08-18 DIAGNOSIS — H538 Other visual disturbances: Secondary | ICD-10-CM | POA: Diagnosis not present

## 2021-08-18 LAB — CBC WITH DIFFERENTIAL/PLATELET
Abs Immature Granulocytes: 0.02 10*3/uL (ref 0.00–0.07)
Basophils Absolute: 0 10*3/uL (ref 0.0–0.1)
Basophils Relative: 1 %
Eosinophils Absolute: 0.1 10*3/uL (ref 0.0–0.5)
Eosinophils Relative: 2 %
HCT: 23.8 % — ABNORMAL LOW (ref 36.0–46.0)
Hemoglobin: 7.6 g/dL — ABNORMAL LOW (ref 12.0–15.0)
Immature Granulocytes: 0 %
Lymphocytes Relative: 34 %
Lymphs Abs: 2 10*3/uL (ref 0.7–4.0)
MCH: 32.2 pg (ref 26.0–34.0)
MCHC: 31.9 g/dL (ref 30.0–36.0)
MCV: 100.8 fL — ABNORMAL HIGH (ref 80.0–100.0)
Monocytes Absolute: 0.5 10*3/uL (ref 0.1–1.0)
Monocytes Relative: 8 %
Neutro Abs: 3.1 10*3/uL (ref 1.7–7.7)
Neutrophils Relative %: 55 %
Platelets: 134 10*3/uL — ABNORMAL LOW (ref 150–400)
RBC: 2.36 MIL/uL — ABNORMAL LOW (ref 3.87–5.11)
RDW: 15.6 % — ABNORMAL HIGH (ref 11.5–15.5)
WBC: 5.8 10*3/uL (ref 4.0–10.5)
nRBC: 0 % (ref 0.0–0.2)

## 2021-08-18 LAB — HEPATITIS C ANTIBODY: HCV Ab: NONREACTIVE

## 2021-08-18 LAB — GLUCOSE, CAPILLARY
Glucose-Capillary: 136 mg/dL — ABNORMAL HIGH (ref 70–99)
Glucose-Capillary: 74 mg/dL (ref 70–99)
Glucose-Capillary: 104 mg/dL — ABNORMAL HIGH (ref 70–99)
Glucose-Capillary: 97 mg/dL (ref 70–99)

## 2021-08-18 LAB — BASIC METABOLIC PANEL
Anion gap: 10 (ref 5–15)
BUN: 73 mg/dL — ABNORMAL HIGH (ref 8–23)
CO2: 18 mmol/L — ABNORMAL LOW (ref 22–32)
Calcium: 6.4 mg/dL — CL (ref 8.9–10.3)
Chloride: 107 mmol/L (ref 98–111)
Creatinine, Ser: 3.86 mg/dL — ABNORMAL HIGH (ref 0.44–1.00)
GFR, Estimated: 12 mL/min — ABNORMAL LOW (ref >60)
Glucose, Bld: 160 mg/dL — ABNORMAL HIGH (ref 70–99)
Potassium: 5.5 mmol/L — ABNORMAL HIGH (ref 3.5–5.1)
Sodium: 135 mmol/L (ref 135–145)

## 2021-08-18 LAB — PHOSPHORUS: Phosphorus: 5.7 mg/dL — ABNORMAL HIGH (ref 2.5–4.6)

## 2021-08-18 LAB — HEPATITIS B SURFACE ANTIGEN: Hepatitis B Surface Ag: NONREACTIVE

## 2021-08-18 LAB — MAGNESIUM: Magnesium: 1.6 mg/dL — ABNORMAL LOW (ref 1.7–2.4)

## 2021-08-18 MED ORDER — SODIUM CHLORIDE 0.9 % IV SOLN: INTRAVENOUS | Status: DC

## 2021-08-18 MED ORDER — HYDROMORPHONE HCL 2 MG PO TABS
4.0000 mg | ORAL_TABLET | ORAL | Status: DC | PRN
  Administered 2021-08-18 – 2021-09-06 (×22): 4 mg via ORAL
  Filled 2021-08-18 (×4): qty 2
  Filled 2021-08-18: qty 1
  Filled 2021-08-18 (×2): qty 2
  Filled 2021-08-18 (×2): qty 1
  Filled 2021-08-18 (×5): qty 2
  Filled 2021-08-18: qty 1
  Filled 2021-08-18 (×7): qty 2

## 2021-08-18 MED ORDER — MAGNESIUM SULFATE IN D5W 1-5 GM/100ML-% IV SOLN
1.0000 g | Freq: Once | INTRAVENOUS | Status: AC
  Administered 2021-08-18: 1 g via INTRAVENOUS
  Filled 2021-08-18 (×2): qty 100

## 2021-08-18 MED ORDER — FLUCONAZOLE 150 MG PO TABS
150.0000 mg | ORAL_TABLET | Freq: Once | ORAL | Status: AC
  Administered 2021-08-18: 150 mg via ORAL
  Filled 2021-08-18: qty 1

## 2021-08-18 MED ORDER — CALCIUM GLUCONATE-NACL 1-0.675 GM/50ML-% IV SOLN
1.0000 g | Freq: Once | INTRAVENOUS | Status: AC
  Administered 2021-08-18: 1000 mg via INTRAVENOUS
  Filled 2021-08-18: qty 50

## 2021-08-18 MED ORDER — SODIUM BICARBONATE 650 MG PO TABS
650.0000 mg | ORAL_TABLET | Freq: Three times a day (TID) | ORAL | Status: DC
  Administered 2021-08-18 – 2021-08-28 (×21): 650 mg via ORAL
  Filled 2021-08-18 (×26): qty 1

## 2021-08-18 MED ORDER — HYDROMORPHONE HCL 1 MG/ML IJ SOLN
0.5000 mg | INTRAMUSCULAR | Status: DC | PRN
  Administered 2021-08-18 – 2021-08-27 (×22): 0.5 mg via INTRAVENOUS
  Filled 2021-08-18 (×21): qty 0.5
  Filled 2021-08-18: qty 1
  Filled 2021-08-18: qty 0.5

## 2021-08-18 NOTE — Progress Notes (Signed)
PROGRESS NOTE    Heather [name]  EHM:[id number] DOB: [birth year] DOA: 08/16/2021 PCP: [name], MD    Brief Narrative:  70 year old female with history of CKD stage IIIb, Crohn's disease and IBS with chronic diarrhea, hypertension, type 2 diabetes, BPPV who presented to the emergency room with blurry vision and diplopia for 2 days along with stabbing headache mostly on the back of her head.  She had urinary symptoms and was treated with doxycycline since last 7 days.  She also developed diffuse rash while taking it.  In the emergency room afebrile.  Heart rate normal.  Blood pressure systolic 366-294.  Patient was given labetalol in the ER and blood pressure improved.  Neurology was consulted.  MRI of the brain and MRA/MRV was done and was essentially normal.  She was also found to be suffering with acute kidney injury.  Potassium was 3.1.  Magnesium was 0.7.  Blood sugar was 37.   Assessment & Plan:   Principal Problem:   Blurry vision, bilateral Active Problems:   Essential hypertension   Depression   Acute kidney injury superimposed on CKD ([place])   Crohn's disease of small intestine with other complication ([place])   Hypomagnesemia   Acute hypokalemia  Blurry vision with headache/hypertensive emergency: Initially treated with labetalol, blood pressure adequately improved.  Blood pressure medications resumed including metoprolol and clonidine.  10/21, blood pressures low normal.  Continue metoprolol for rate control.  Discontinue clonidine and hydralazine.   MRI of the brain and MRA negative for any acute intracranial process.  No focal neurological deficit. Her neck pain and headache is probably from cervical spine, she does have a history of cervical spine fusion and it looks like musculoskeletal pain. Followed by neurology.  Appreciate their recommendations.  Acute kidney injury on chronic kidney disease stage IIIb.  Record indicates baseline creatinine about 1.6-2.1. Patient  does have history of IBS and diarrhea along with poor appetite.  This is probably prerenal. Continue maintenance IV fluid today.  Renal functions fluctuating and worsening today.   Intake and output monitoring.  Discontinued indomethacin and Lasix.   Renal ultrasound today.   Will discuss with nephrology for consultation given worsening renal functions.    Hypomagnesemia: Severe and persistent.  Making adequate urine.  0.7 on presentation.  Replaced aggressively and improving.  Further replacement today and recheck level tomorrow morning.    Hypokalemia: Replaced aggressively.  Overcorrected.  Discontinue further replacement.    Hypocalcemia: Replace and monitor levels.  Type 2 diabetes, uncontrolled with hypoglycemia: In the setting of poor appetite and acute kidney injury.    Oral intake has a stabilized.  Blood sugars improved and more than 150.  She takes Levemir 70 units at night, started on 15 units.  Keep on sliding scale insulin.  Encourage oral intake.  She will stay on similar doses today to avoid hypoglycemic episodes.  Crohn's disease with chronic diarrhea: Previously on Lomotil, resume.  Patient is on Humira injections that she will resume as outpatient.  Also resumed Bentyl.  Skin rashes: Primary cause unknown.  This started after taking doxycycline and Diflucan.   Probably drug rashes.  Platelets are adequate.  Monitor.  Anemia of chronic disease: Hemoglobin is 7.6 today.  Usually remains at 9-10 range.  Will monitor.  Iron levels were adequate.  Due to patient's worsening renal functions, decreasing hemoglobin without obvious bleeding called and discussed case with nephrology team for consultation.   DVT prophylaxis: heparin injection 5,000 Units Start: 08/16/21 2230  Code Status: Full code Family Communication: None.  Patient communicating with her husband. Disposition Plan: Status is: Inpatient  Remains inpatient appropriate because: Severe electrolyte abnormalities,  fluctuating blood sugars and blood pressures.  Needing IV electrolyte replacements.         Consultants:  None  Procedures:  None  Antimicrobials:  None   Subjective: Patient seen and examined.  Complains of dry mouth.  Neck pain mostly on the left suprascapular area worse with movement.  No weakness of the arms or legs.  Still has horizontal diplopia.  Objective: Vitals:   08/17/21 1700 08/17/21 2047 08/18/21 0513 08/18/21 0910  BP: (!) 99/56 (!) 109/58 99/87 (!) 132/57  Pulse: 75 78 68 73  Resp: 18 18 20 20   Temp:  98.1 F (36.7 C) 97.7 F (36.5 C)   TempSrc:  Oral Oral   SpO2: 100% 100% 100% 100%  Weight:      Height:        Intake/Output Summary (Last 24 hours) at 08/18/2021 1120 Last data filed at 08/18/2021 0900 Gross per 24 hour  Intake 2158.28 ml  Output 500 ml  Net 1658.28 ml   Filed Weights   08/16/21 1058  Weight: 68 kg    Examination:  General exam: Appears calm and comfortable , sitting at the edge of the bed.  On room air. Respiratory system: Clear to auscultation. Respiratory effort normal.  No added sounds. Cardiovascular system: S1 & S2 heard, RRR. No JVD, murmurs, rubs, gallops or clicks. No pedal edema. Gastrointestinal system: Abdomen is nondistended, soft and nontender. No organomegaly or masses felt. Normal bowel sounds heard. Central nervous system: Alert and oriented. No focal neurological deficits. She does have some muscle tenderness on the left side of the posterior neck. Extremities: Symmetric 5 x 5 power. Skin: No rashes, lesions or ulcers Psychiatry: Judgement and insight appear normal. Mood & affect anxious.    Data Reviewed: I have personally reviewed following labs and imaging studies  CBC: Recent Labs  Lab 08/16/21 1206 08/17/21 0542 08/18/21 0622  WBC 9.4 9.0 5.8  NEUTROABS 6.6  --  3.1  HGB 9.7* 9.1* 7.6*  HCT 30.4* 29.5* 23.8*  MCV 95.9 100.3* 100.8*  PLT 216 187 397*   Basic Metabolic Panel: Recent  Labs  Lab 08/16/21 1206 08/17/21 0542 08/18/21 0622  NA 139 137 135  K 3.1* 4.9 5.5*  CL 106 109 107  CO2 21* 17* 18*  GLUCOSE 43* 221* 160*  BUN 56* 58* 73*  CREATININE 3.23* 3.21* 3.86*  CALCIUM 6.5* 6.5* 6.4*  MG 0.7* 2.1 1.6*  PHOS  --   --  5.7*   GFR: Estimated Creatinine Clearance: 12.7 mL/min (A) (by C-G formula based on SCr of 3.86 mg/dL (H)). Liver Function Tests: Recent Labs  Lab 08/16/21 1206  AST 21  ALT 17  ALKPHOS 57  BILITOT 1.0  PROT 6.9  ALBUMIN 3.4*   No results for input(s): LIPASE, AMYLASE in the last 168 hours. No results for input(s): AMMONIA in the last 168 hours. Coagulation Profile: No results for input(s): INR, PROTIME in the last 168 hours. Cardiac Enzymes: No results for input(s): CKTOTAL, CKMB, CKMBINDEX, TROPONINI in the last 168 hours. BNP (last 3 results) No results for input(s): PROBNP in the last 8760 hours. HbA1C: No results for input(s): HGBA1C in the last 72 hours. CBG: Recent Labs  Lab 08/17/21 0750 08/17/21 1149 08/17/21 1631 08/17/21 2047 08/18/21 0737  GLUCAP 199* 177* 236* 122* 136*   Lipid Profile:  No results for input(s): CHOL, HDL, LDLCALC, TRIG, CHOLHDL, LDLDIRECT in the last 72 hours. Thyroid Function Tests: Recent Labs    08/17/21 0544  TSH 3.426   Anemia Panel: No results for input(s): VITAMINB12, FOLATE, FERRITIN, TIBC, IRON, RETICCTPCT in the last 72 hours. Sepsis Labs: No results for input(s): PROCALCITON, LATICACIDVEN in the last 168 hours.  Recent Results (from the past 240 hour(s))  Resp Panel by RT-PCR (Flu A&B, Covid) Nasopharyngeal Swab     Status: None   Collection Time: 08/16/21  8:28 PM   Specimen: Nasopharyngeal Swab; Nasopharyngeal(NP) swabs in vial transport medium  Result Value Ref Range Status   SARS Coronavirus 2 by RT PCR NEGATIVE NEGATIVE Final    Comment: (NOTE) SARS-CoV-2 target nucleic acids are NOT DETECTED.  The SARS-CoV-2 RNA is generally detectable in upper  respiratory specimens during the acute phase of infection. The lowest concentration of SARS-CoV-2 viral copies this assay can detect is 138 copies/mL. A negative result does not preclude SARS-Cov-2 infection and should not be used as the sole basis for treatment or other patient management decisions. A negative result may occur with  improper specimen collection/handling, submission of specimen other than nasopharyngeal swab, presence of viral mutation(s) within the areas targeted by this assay, and inadequate number of viral copies(<138 copies/mL). A negative result must be combined with clinical observations, patient history, and epidemiological information. The expected result is Negative.  Fact Sheet for Patients:  EntrepreneurPulse.com.au  Fact Sheet for Healthcare Providers:  IncredibleEmployment.be  This test is no t yet approved or cleared by the [place] FDA and  has been authorized for detection and/or diagnosis of SARS-CoV-2 by FDA under an Emergency Use Authorization (EUA). This EUA will remain  in effect (meaning this test can be used) for the duration of the COVID-19 declaration under Section 564(b)(1) of the Act, 21 U.S.C.section 360bbb-3(b)(1), unless the authorization is terminated  or revoked sooner.       Influenza A by PCR NEGATIVE NEGATIVE Final   Influenza B by PCR NEGATIVE NEGATIVE Final    Comment: (NOTE) The Xpert Xpress SARS-CoV-2/FLU/RSV plus assay is intended as an aid in the diagnosis of influenza from Nasopharyngeal swab specimens and should not be used as a sole basis for treatment. Nasal washings and aspirates are unacceptable for Xpert Xpress SARS-CoV-2/FLU/RSV testing.  Fact Sheet for Patients: EntrepreneurPulse.com.au  Fact Sheet for Healthcare Providers: IncredibleEmployment.be  This test is not yet approved or cleared by the [place] FDA and has been  authorized for detection and/or diagnosis of SARS-CoV-2 by FDA under an Emergency Use Authorization (EUA). This EUA will remain in effect (meaning this test can be used) for the duration of the COVID-19 declaration under Section 564(b)(1) of the Act, 21 U.S.C. section 360bbb-3(b)(1), unless the authorization is terminated or revoked.  Performed at [facility], [address] [place], [place] [address]          Radiology Studies: MR ANGIO HEAD WO CONTRAST  Result Date: 08/16/2021 CLINICAL DATA:  Diplopia; Vision loss, binocular diplopia EXAM: MRA HEAD WITHOUT CONTRAST MRV HEAD WITHOUT CONTRAST TECHNIQUE: Angiographic images of the Circle of Willis were acquired using MRA technique without intravenous contrast. Angiographic images of the intracranial venous structures were acquired using MRV technique without intravenous contrast. COMPARISON:  MRA July 21 21. FINDINGS: MRA HEAD FINDINGS Anterior circulation: Chronically small left ACA with suspected superimposed distal left A1 and proximal A2 stenosis, possibly severe. Right A1 ACA is patent without significant stenosis. Preserved flow related signal  in more distal ACAs bilaterally. Bilateral intracranial ICAs, MCAs are patent without proximal hemodynamically significant stenosis. Posterior circulation: Bilateral intradural dural vertebral arteries, basilar artery and posterior cerebral arteries are patent without proximal hemodynamically significant stenosis. Similar left posterior communicating artery, which appears despite left posterior cerebral artery territory. Similar suspected small or stenotic left P1 PCA. No aneurysm identified. Anatomic variants: Detailed above. MRV HEAD FINDINGS The in superior sagittal sinus, transverse and sigmoid sinuses, jugular bulbs, straight sinus, and visible deep cerebral veins are patent. The left transverse sinus is or narrowed. IMPRESSION: 1. No proximal large vessel occlusion. 2. Chronically small left ACA  with suspected distal left A1 and proximal A2 stenosis, possibly severe. A CTA could confirm and further characterize if clinically indicated. 3. No evidence of dural venous sinus thrombosis. Electronically Signed   By: Margaretha Sheffield M.D.   On: 08/16/2021 18:38   MR BRAIN WO CONTRAST  Result Date: 08/16/2021 CLINICAL DATA:  Headache, new or worsening. Additional history provided: Patient reports blurry vision for 2 days, worsening. Patient also reports occipital head pain which radiates down back. EXAM: MRI HEAD WITHOUT CONTRAST TECHNIQUE: Multiplanar, multiecho pulse sequences of the brain and surrounding structures were obtained without intravenous contrast. COMPARISON:  Prior brain MRI examinations 05/18/2021 and earlier FINDINGS: Brain: Mild intermittent motion degradation. Mild generalized cerebral and cerebellar atrophy. There are symmetric subcentimeter remote insults within the globus pallidus bilaterally, a new finding as compared to prior exams (series 15, image 24). These may reflect chronic lacunar infarcts or sequela of a toxic/metabolic insult. Mild multifocal T2 FLAIR hyperintense signal abnormality within the cerebral white matter and pons, progressed from the prior MRI of 05/17/2020. Unchanged 6 mm dural-based mass overlying the anterior right frontal lobe, consistent with a small incidental meningioma. There is no acute infarct. No extra-axial fluid collection. No midline shift. Vascular: Maintained flow voids within the proximal large arterial vessels. Skull and upper cervical spine: No focal suspicious marrow lesion. Susceptibility artifact arising from ACDF hardware. Sinuses/Orbits: Visualized orbits show no acute finding. Bilateral lens replacements. No significant paranasal sinus disease. Other: Small-volume fluid within the left mastoid air cells. IMPRESSION: Mildly motion degraded exam. No evidence of acute intracranial abnormality. Small symmetric remote insults within the globus  pallidus bilaterally, a new finding as compared to prior exams. These may reflect chronic lacunar infarcts or sequela of a remote toxic/metabolic insult. Mild chronic small vessel ischemic changes within the cerebral white matter and pons, progressed. Unchanged size of a 6 mm meningioma overlying the right frontal lobe. Mild generalized parenchymal atrophy. Small-volume fluid within the left mastoid air cells. Electronically Signed   By: Kellie Simmering D.O.   On: 08/16/2021 15:17   MR MRV HEAD WO CM  Result Date: 08/16/2021 CLINICAL DATA:  Diplopia; Vision loss, binocular diplopia EXAM: MRA HEAD WITHOUT CONTRAST MRV HEAD WITHOUT CONTRAST TECHNIQUE: Angiographic images of the Circle of Willis were acquired using MRA technique without intravenous contrast. Angiographic images of the intracranial venous structures were acquired using MRV technique without intravenous contrast. COMPARISON:  MRA July 21 21. FINDINGS: MRA HEAD FINDINGS Anterior circulation: Chronically small left ACA with suspected superimposed distal left A1 and proximal A2 stenosis, possibly severe. Right A1 ACA is patent without significant stenosis. Preserved flow related signal in more distal ACAs bilaterally. Bilateral intracranial ICAs, MCAs are patent without proximal hemodynamically significant stenosis. Posterior circulation: Bilateral intradural dural vertebral arteries, basilar artery and posterior cerebral arteries are patent without proximal hemodynamically significant stenosis. Similar left posterior communicating artery, which  appears despite left posterior cerebral artery territory. Similar suspected small or stenotic left P1 PCA. No aneurysm identified. Anatomic variants: Detailed above. MRV HEAD FINDINGS The in superior sagittal sinus, transverse and sigmoid sinuses, jugular bulbs, straight sinus, and visible deep cerebral veins are patent. The left transverse sinus is or narrowed. IMPRESSION: 1. No proximal large vessel occlusion.  2. Chronically small left ACA with suspected distal left A1 and proximal A2 stenosis, possibly severe. A CTA could confirm and further characterize if clinically indicated. 3. No evidence of dural venous sinus thrombosis. Electronically Signed   By: Margaretha Sheffield M.D.   On: 08/16/2021 18:38        Scheduled Meds:  allopurinol  100 mg Oral Daily   atorvastatin  80 mg Oral q morning   citalopram  20 mg Oral Daily   dicyclomine  20 mg Oral TID AC   gabapentin  300 mg Oral BID   heparin  5,000 Units Subcutaneous Q8H   insulin aspart  0-9 Units Subcutaneous TID WC   insulin detemir  15 Units Subcutaneous QHS   lipase/protease/amylase  36,000-72,000 Units Oral See admin instructions   metoprolol tartrate  100 mg Oral BID   pantoprazole  40 mg Oral Daily   sodium bicarbonate  650 mg Oral TID   venlafaxine XR  150 mg Oral Daily   Continuous Infusions:  sodium chloride 75 mL/hr at 08/18/21 1045   calcium gluconate 1,000 mg (08/18/21 1049)   magnesium sulfate bolus IVPB       LOS: 1 day    Time spent: 32 minutes    Barb Merino, MD [facility] Pager [phone]

## 2021-08-18 NOTE — Progress Notes (Signed)
Date and time results received: 08/18/21 0655  (use smartphrase ".now" to insert current time)  Test: calcium Critical Value: 6.4  Name of Provider Notified: [place]  Orders Received? Or Actions Taken?: None at this time.

## 2021-08-18 NOTE — Care Management Important Message (Signed)
Important Message  Patient Details  Name: Heather [name] MRN: [id number] Date of Birth: Feb 07, 1951   Medicare Important Message Given:  Yes     Tommy Medal 08/18/2021, 2:02 PM

## 2021-08-18 NOTE — Consult Note (Addendum)
[facility] Nephrology Consultation Note  Requesting MD: Dr. Barb Merino Reason for consult: AKI  HPI:  Heather [name] is a 70 y.o. female with history of DM, HTN, IBS, Crohn's disease with chronic diarrhea, CKD stage IIIb with chronic proteinuria followed by Dr. Theador Hawthorne presents with diplopia, stabbing headache, seen as a consultation at the request of Dr. Sloan Leiter for the evaluation of acute kidney injury. It seems like patient has longstanding CKD thought to be due to diabetic nephropathy and has chronic proteinuria.  She follows with Dr. Theador Hawthorne and supposed to see him sometime this week.  The baseline serum creatinine level seems to be around 1.6-2.2 with multiple episode of acute kidney injury in the past.  Serology including ANA, ANCA, complements, hep B, HIV were unremarkable in the past.  She came to receive ESA injection yesterday where she was complaining of severe headache and blurry vision.  The injection was not given and patient was referred to the ER for further evaluation. In the ER, the systolic blood pressure was between 150-210.  She received multiple dose of IV antihypertensives with improvement of blood pressure.  Neurology was consulted.  MRI brain, MRA and MRV without any acute finding.  The creatinine level was 3.23 on admission which was increased to 3.86 today.  She has around 800 cc of urine output recorded.  Apparently, the blood pressure dropped to 90s last night.  She is currently on metoprolol, clonidine and hydralazine.  She has not gotten her clonidine and hydralazine this morning because of soft BP.  The urinalysis with chronic proteinuria but without WBC.  The kidney ultrasound was done this morning.  Per patient she was diagnosed with urinary tract infection recently and completed a week of oral antibiotics.  She is not sure about the name of antibiotics.  She developed rash in her upper and lower extremities, not itchy.  It seems like she is on  indomethacin at home.  She has allergy to multiple medications including ACE inhibitor and ARB.  She is on Lasix 40 mg daily.  Today's lab noticed worsening creatinine level associated with metabolic acidosis, mild hyperkalemia and worsening anemia.  Creatinine, Ser  Date/Time Value Ref Range Status  08/18/2021 06:22 AM 3.86 (H) 0.44 - 1.00 mg/dL Final  08/17/2021 05:42 AM 3.21 (H) 0.44 - 1.00 mg/dL Final  08/16/2021 12:06 PM 3.23 (H) 0.44 - 1.00 mg/dL Final  08/02/2021 10:33 AM 2.13 (H) 0.44 - 1.00 mg/dL Final  06/20/2021 09:54 AM 1.63 (H) 0.44 - 1.00 mg/dL Final  05/18/2021 11:53 AM 1.76 (H) 0.44 - 1.00 mg/dL Final  04/20/2021 01:29 PM 2.11 (H) 0.44 - 1.00 mg/dL Final  03/27/2021 01:49 PM 2.29 (H) 0.44 - 1.00 mg/dL Final    PMHx:   Past Medical History:  Diagnosis Date   Acute renal failure superimposed on stage 3 chronic kidney disease (HCC) 01/21/2016   Anemia    Anxiety    B12 deficiency    Brain tumor (benign) ([place]) 10/29/2020   Cellulitis and abscess    Abdomen and buttocks   CKD (chronic kidney disease) stage 3, GFR 30-59 ml/min (HCC)    Closed fracture of distal end of femur, unspecified fracture morphology, initial encounter ([place])    Depression    Diabetic neuropathy (HCC)    feet   DJD (degenerative joint disease)    Right forminal stenosis C4-5   Essential hypertension    Femur fracture, right ([place]) 07/08/3715   Folliculitis    GERD (gastroesophageal reflux  disease)    Gout    Headache(784.0)    Hiatal hernia    History of blood transfusion    History of bronchitis    History of cardiac catheterization    No significant CAD 2012   History of kidney stones    surgery to remove   Insomnia    Irritable bowel syndrome    Mixed hyperlipidemia    Osteoarthritis    Palpitations    Pneumonia    Reflux esophagitis    Salmonella    Stroke [facility])    Tinnitus    Right   Type 2 diabetes mellitus [facility])     Past Surgical History:  Procedure Laterality Date    BACK SURGERY     c-spine surgery     03/2007   CARDIAC CATHETERIZATION     CHOLECYSTECTOMY     COLONOSCOPY  04/21/2011; 05/29/11   6/12: [place] - ?AVM at IC valve, inflammatory changes at IC valve [facility]); 7/12 - [place]; IC valve erosions, look chronic and probably Crohn's per path   colonoscopy  2017   [place]: random biopsies normal. TI normal   ESOPHAGOGASTRODUODENOSCOPY  05/29/11   normal   ESOPHAGOGASTRODUODENOSCOPY N/A 01/21/2016   Dr. Michail Sermon: normal   EYE SURGERY Bilateral    cataracts removed   FEMUR IM NAIL Right 04/10/2018   Procedure: INTRAMEDULLARY (IM) NAIL FEMORAL;  Surgeon: Paralee Cancel, MD;  Location: [facility];  Service: Orthopedics;  Laterality: Right;   FRACTURE SURGERY     GIVENS CAPSULE STUDY  11/17/2020   HARDWARE REMOVAL Right 04/10/2018   Procedure: HARDWARE REMOVAL RIGHT DISTAL FEMUR;  Surgeon: Paralee Cancel, MD;  Location: [facility];  Service: Orthopedics;  Laterality: Right;   HERNIA REPAIR     IR FLUORO GUIDE CV LINE RIGHT  04/01/2017   IR US GUIDE VASC ACCESS RIGHT  04/01/2017   JOINT REPLACEMENT Right    hip   OPEN REDUCTION INTERNAL FIXATION (ORIF) DISTAL RADIAL FRACTURE Right 05/28/2019   Procedure: OPEN REDUCTION INTERNAL FIXATION (ORIF) DISTAL RADIAL FRACTURE;  Surgeon: Verner Mould, MD;  Location: [facility];  Service: Orthopedics;  Laterality: Right;  with block 90 minutes   ORIF FEMUR FRACTURE Right 03/31/2017   Procedure: OPEN REDUCTION INTERNAL FIXATION (ORIF) DISTAL FEMUR FRACTURE;  Surgeon: Paralee Cancel, MD;  Location: [facility];  Service: Orthopedics;  Laterality: Right;   removal of kidney stone     Right knee arthroscopic surgery     TONSILLECTOMY     TOTAL ABDOMINAL HYSTERECTOMY      Family Hx:  Family History  Problem Relation Age of Onset   Diabetes Mother    Colon cancer Brother        POSSIBLY. Patient states diagnosed at age 28, then later states age 54. unclear and limited historian   Esophageal cancer Neg Hx    Rectal  cancer Neg Hx    Stomach cancer Neg Hx     Social History:  reports that she has never smoked. She has never used smokeless tobacco. She reports that she does not drink alcohol and does not use drugs.  Allergies:  Allergies  Allergen Reactions   Duloxetine Hcl Swelling   Fluocinolone Swelling   Acetaminophen Itching and Swelling    Pt states "some swelling"   Amlodipine Besylate Swelling    Swelling of feet, fluid retention   Ciprofloxacin Nausea And Vomiting and Swelling    Swelling of face, jaw, and lips   Hydrocodone-Acetaminophen Itching   Metformin  Other (See Comments)    REACTION: GI UPSET   Nsaids Other (See Comments)    "HAS BLEEDING ULCERS"   Quinolones Swelling   Sulfamethoxazole Swelling    Swelling of feet, legs   Valsartan Swelling   Cozaar [Losartan] Swelling and Rash   Lisinopril Swelling and Rash    Oral swelling, and red streaks on arms/stomach   Tape Itching and Rash    Paper tape can only be used on this patient    Medications: Prior to Admission medications   Medication Sig Start Date End Date Taking? Authorizing Provider  acetaminophen (TYLENOL) 650 MG CR tablet Take 650 mg by mouth every 8 (eight) hours as needed for pain.   Yes [provider]  Adalimumab (HUMIRA PEN) 40 MG/0.4ML PNKT Inject 40 mg into the skin every 14 (fourteen) days. 08/15/21  Yes Gatha Mayer, MD  albuterol (VENTOLIN HFA) 108 (90 Base) MCG/ACT inhaler Inhale 1-2 puffs into the lungs every 6 (six) hours as needed for shortness of breath or wheezing. 02/16/21 02/16/22 Yes [provider]  allopurinol (ZYLOPRIM) 100 MG tablet Take 100 mg by mouth daily. 03/20/21  Yes [provider]  Ascorbic Acid (VITAMIN C WITH ROSE HIPS) 500 MG tablet Take 500 mg by mouth every morning.   Yes [provider]  atorvastatin (LIPITOR) 80 MG tablet Take 80 mg by mouth every morning.   Yes [provider]  Cholecalciferol (VITAMIN D) 2000 units CAPS Take  2,000 Units by mouth every morning.    Yes [provider]  citalopram (CELEXA) 20 MG tablet Take 1 tablet (20 mg total) by mouth daily. 01/12/21  Yes Barton Dubois, MD  cloNIDine (CATAPRES) 0.2 MG tablet Take 0.2 mg by mouth 2 (two) times daily.   Yes [provider]  cyanocobalamin (,VITAMIN B-12,) 1000 MCG/ML injection Inject 1,000 mcg into the muscle every 30 (thirty) days.     Yes [provider]  dicyclomine (BENTYL) 20 MG tablet Take 1 tablet (20 mg total) by mouth 3 (three) times daily before meals. Patient taking differently: Take 20 mg by mouth in the morning and at bedtime. 01/12/21  Yes Barton Dubois, MD  epoetin alfa-epbx (RETACRIT) 3000 UNIT/ML injection Inject 3,000 Units into the skin every 14 (fourteen) days. 11/21/20  Yes Bhutani, Manpreet S, MD  esomeprazole (NEXIUM) 40 MG capsule Take 40 mg by mouth daily before breakfast.     Yes [provider]  fenofibrate (TRICOR) 145 MG tablet Take 1 tablet (145 mg total) by mouth daily. 09/07/20  Yes BranchAlphonse Guild, MD  furosemide (LASIX) 40 MG tablet Take 40 mg by mouth in the morning, at noon, and at bedtime.   Yes [provider]  gabapentin (NEURONTIN) 300 MG capsule Take 300 mg by mouth in the morning and at bedtime. *May take one additional capsule as needed for pain 10/20/20  Yes [provider]  HYDROmorphone (DILAUDID) 2 MG tablet Take 0.5 tablets (1 mg total) by mouth every 4 (four) hours as needed for severe pain. 05/18/21  Yes Isla Pence, MD  indomethacin (INDOCIN) 50 MG capsule Take 50 mg by mouth 3 (three) times daily. 07/05/21  Yes [provider]  insulin detemir (LEVEMIR FLEXTOUCH) 100 UNIT/ML FlexPen Inject 70 Units into the skin at bedtime. 08/03/21  Yes Brita Romp, NP  Insulin Lispro (HUMALOG KWIKPEN [place]) Inject 20-26 Units into the skin 3 (three) times daily with meals. Sliding scale   Yes [provider]  lipase/protease/amylase (CREON)  36000 UNITS CPEP capsule Take 1 capsule (36,000 Units total) by mouth with snacks. Patient taking differently: Take 36,000-72,000 Units by mouth See admin instructions. Takes 2 capsules with all meals and takes one capsule with snacks 01/12/21  Yes Barton Dubois, MD  meclizine (ANTIVERT) 25 MG tablet Take 1 tablet by mouth as needed for dizziness or nausea.   Yes [provider]  metoprolol tartrate (LOPRESSOR) 100 MG tablet Take 100 mg by mouth 2 (two) times daily. 05/07/21  Yes [provider]  omega-3 acid ethyl esters (LOVAZA) 1 g capsule Take 3 capsules by mouth 3 (three) times daily. 10/11/20  Yes [provider]  potassium chloride SA (K-DUR,KLOR-CON) 20 MEQ tablet Take 20 mEq by mouth every morning.   Yes [provider]  promethazine (PHENERGAN) 25 MG tablet Take 1 tablet (25 mg total) by mouth every 6 (six) hours as needed for nausea or vomiting. 05/18/21  Yes Isla Pence, MD  temazepam (RESTORIL) 15 MG capsule Take 15 mg by mouth at bedtime as needed for sleep. 04/14/21  Yes [provider]  tiZANidine (ZANAFLEX) 4 MG tablet Take 8 mg by mouth at bedtime. 09/06/20  Yes [provider]  venlafaxine XR (EFFEXOR-XR) 150 MG 24 hr capsule Take 150 mg by mouth daily. 04/27/21  Yes [provider]  Adalimumab (HUMIRA PEN) 40 MG/0.4ML PNKT Inject 40 mg into the skin every 14 days Patient not taking: No sig reported 08/15/21   Gatha Mayer, MD  diphenoxylate-atropine (LOMOTIL) 2.5-0.025 MG tablet 1-2 tablets qid prn diarrhea Instead of Imodium Patient not taking: No sig reported 12/06/20   Gatha Mayer, MD  hydrALAZINE (APRESOLINE) 25 MG tablet Take 1 tablet (25 mg total) by mouth 3 (three) times daily. Patient not taking: No sig reported 05/18/21 08/16/21  Isla Pence, MD    I have reviewed the patient's current medications.  Labs:  Results for orders placed or performed during the hospital encounter of 08/16/21 (from the  past 48 hour(s))  POC CBG, ED     Status: None   Collection Time: 08/16/21 11:04 AM  Result Value Ref Range   Glucose-Capillary 89 70 - 99 mg/dL    Comment: Glucose reference range applies only to samples taken after fasting for at least 8 hours.  CBC with Differential/Platelet     Status: Abnormal   Collection Time: 08/16/21 12:06 PM  Result Value Ref Range   WBC 9.4 4.0 - 10.5 K/uL   RBC 3.17 (L) 3.87 - 5.11 MIL/uL   Hemoglobin 9.7 (L) 12.0 - 15.0 g/dL   HCT 30.4 (L) 36.0 - 46.0 %   MCV 95.9 80.0 - 100.0 fL   MCH 30.6 26.0 - 34.0 pg   MCHC 31.9 30.0 - 36.0 g/dL   RDW 15.2 11.5 - 15.5 %   Platelets 216 150 - 400 K/uL   nRBC 0.0 0.0 - 0.2 %   Neutrophils Relative % 71 %   Neutro Abs 6.6 1.7 - 7.7 K/uL   Lymphocytes Relative 18 %   Lymphs Abs 1.6 0.7 - 4.0 K/uL   Monocytes Relative 7 %   Monocytes Absolute 0.7 0.1 - 1.0 K/uL   Eosinophils Relative 2 %   Eosinophils Absolute 0.2 0.0 - 0.5 K/uL   Basophils Relative 1 %   Basophils Absolute 0.1 0.0 - 0.1 K/uL   Immature Granulocytes 1 %   Abs Immature Granulocytes 0.12 (H) 0.00 - 0.07 K/uL    Comment: Performed at [facility], [address], [place], [place] [address]  Comprehensive metabolic panel     Status: Abnormal   Collection Time: 08/16/21 12:06 PM  Result Value Ref Range   Sodium 139 135 - 145 mmol/L   Potassium 3.1 (L) 3.5 - 5.1 mmol/L   Chloride 106 98 - 111 mmol/L   CO2 21 (L) 22 - 32 mmol/L   Glucose, Bld 43 (LL) 70 - 99 mg/dL    Comment: Glucose reference range applies only to samples taken after fasting for at least 8 hours. CRITICAL RESULT CALLED TO, READ BACK BY AND VERIFIED WITH: B OAKLEY RN [id number] K FORSYTH    BUN 56 (H) 8 - 23 mg/dL   Creatinine, Ser 3.23 (H) 0.44 - 1.00 mg/dL   Calcium 6.5 (L) 8.9 - 10.3 mg/dL   Total Protein 6.9 6.5 - 8.1 g/dL   Albumin 3.4 (L) 3.5 - 5.0 g/dL   AST 21 15 - 41 U/L   ALT 17 0 - 44 U/L   Alkaline Phosphatase 57 38 - 126 U/L   Total Bilirubin 1.0 0.3 - 1.2 mg/dL    GFR, Estimated 15 (L) >60 mL/min    Comment: (NOTE) Calculated using the CKD-EPI Creatinine Equation (2021)    Anion gap 12 5 - 15    Comment: Performed at [facility], [address] [place], [place] [address]  Magnesium     Status: Abnormal   Collection Time: 08/16/21 12:06 PM  Result Value Ref Range   Magnesium 0.7 (LL) 1.7 - 2.4 mg/dL    Comment: CRITICAL RESULT CALLED TO, READ BACK BY AND VERIFIED WITH: PANTHER,Z ON 08/16/21 AT 2200 BY LOY,C Performed at [facility], [address], [place], [place] [address]   CBG monitoring, ED     Status: Abnormal   Collection Time: 08/16/21  1:44 PM  Result Value Ref Range   Glucose-Capillary 126 (H) 70 - 99 mg/dL    Comment: Glucose reference range applies only to samples taken after fasting for at least 8 hours.  POC CBG, ED     Status: Abnormal   Collection Time: 08/16/21  4:40 PM  Result Value Ref Range   Glucose-Capillary 100 (H) 70 - 99 mg/dL    Comment: Glucose reference range applies only to samples taken after fasting for at least 8 hours.  Resp Panel by RT-PCR (Flu A&B, Covid) Nasopharyngeal Swab     Status: None   Collection Time: 08/16/21  8:28 PM   Specimen: Nasopharyngeal Swab; Nasopharyngeal(NP) swabs in vial transport medium  Result Value Ref Range   SARS Coronavirus 2 by RT PCR NEGATIVE NEGATIVE    Comment: (NOTE) SARS-CoV-2 target nucleic acids are NOT DETECTED.  The SARS-CoV-2 RNA is generally detectable in upper respiratory specimens during the acute phase of infection. The lowest concentration of SARS-CoV-2 viral copies this assay can detect is 138 copies/mL. A negative result does not preclude SARS-Cov-2 infection and should not be used as the sole basis for treatment or other patient management decisions. A negative result may occur with  improper specimen collection/handling, submission of specimen other than nasopharyngeal swab, presence of viral mutation(s) within the areas targeted by this assay, and  inadequate number of viral copies(<138 copies/mL). A negative result must be combined with clinical observations, patient history, and epidemiological information. The expected result is Negative.  Fact Sheet for Patients:  EntrepreneurPulse.com.au  Fact Sheet for Healthcare Providers:  IncredibleEmployment.be  This test is no t yet approved or cleared by the [place] and  has been authorized for detection and/or diagnosis of SARS-CoV-2 by FDA under an Emergency Use Authorization (EUA). This EUA will remain  in effect (meaning this test can be used) for the duration of the COVID-19 declaration under Section 564(b)(1) of the Act, 21 U.S.C.section 360bbb-3(b)(1), unless the authorization is terminated  or revoked sooner.       Influenza A by PCR NEGATIVE NEGATIVE   Influenza B by PCR NEGATIVE NEGATIVE    Comment: (NOTE) The Xpert Xpress SARS-CoV-2/FLU/RSV plus assay is intended as an aid in the diagnosis of influenza from Nasopharyngeal swab specimens and should not be used as a sole basis for treatment. Nasal washings and aspirates are unacceptable for Xpert Xpress SARS-CoV-2/FLU/RSV testing.  Fact Sheet for Patients: EntrepreneurPulse.com.au  Fact Sheet for Healthcare Providers: IncredibleEmployment.be  This test is not yet approved or cleared by the [place] FDA and has been authorized for detection and/or diagnosis of SARS-CoV-2 by FDA under an Emergency Use Authorization (EUA). This EUA will remain in effect (meaning this test can be used) for the duration of the COVID-19 declaration under Section 564(b)(1) of the Act, 21 U.S.C. section 360bbb-3(b)(1), unless the authorization is terminated or revoked.  Performed at [facility], [address], [place], [place] [address]   Glucose, capillary     Status: Abnormal   Collection Time: 08/16/21  9:46 PM  Result Value Ref Range    Glucose-Capillary 193 (H) 70 - 99 mg/dL    Comment: Glucose reference range applies only to samples taken after fasting for at least 8 hours.  HIV Antibody (routine testing w rflx)     Status: None   Collection Time: 08/17/21  5:42 AM  Result Value Ref Range   HIV Screen 4th Generation wRfx Non Reactive Non Reactive    Comment: Performed at [facility], [place] [address], [place], [place] [address]  Basic metabolic panel     Status: Abnormal   Collection Time: 08/17/21  5:42 AM  Result Value Ref Range   Sodium 137 135 - 145 mmol/L   Potassium 4.9 3.5 - 5.1 mmol/L    Comment: DELTA CHECK NOTED   Chloride 109 98 - 111 mmol/L   CO2 17 (L) 22 - 32 mmol/L   Glucose, Bld 221 (H) 70 - 99 mg/dL    Comment: Glucose reference range applies only to samples taken after fasting for at least 8 hours.   BUN 58 (H) 8 - 23 mg/dL   Creatinine, Ser 3.21 (H) 0.44 - 1.00 mg/dL   Calcium 6.5 (L) 8.9 - 10.3 mg/dL   GFR, Estimated 15 (L) >60 mL/min    Comment: (NOTE) Calculated using the CKD-EPI Creatinine Equation (2021)    Anion gap 11 5 - 15    Comment: Performed at [facility], [address], [place], [place] [address]  CBC     Status: Abnormal   Collection Time: 08/17/21  5:42 AM  Result Value Ref Range   WBC 9.0 4.0 - 10.5 K/uL   RBC 2.94 (L) 3.87 - 5.11 MIL/uL   Hemoglobin 9.1 (L) 12.0 - 15.0 g/dL   HCT 29.5 (L) 36.0 - 46.0 %   MCV 100.3 (H) 80.0 - 100.0 fL   MCH 31.0 26.0 - 34.0 pg   MCHC 30.8 30.0 - 36.0 g/dL   RDW 15.5 11.5 - 15.5 %   Platelets 187 150 - 400 K/uL   nRBC 0.0 0.0 - 0.2 %    Comment: Performed at [facility], [address] [place], [place] [address]  Magnesium     Status: None   Collection Time: 08/17/21  5:42 AM  Result Value Ref Range   Magnesium 2.1 1.7 - 2.4 mg/dL    Comment: Performed at [facility], [address], [place], [place] [address]  TSH     Status: None   Collection Time: 08/17/21  5:44 AM  Result Value Ref Range   TSH 3.426 0.350 - 4.500 uIU/mL     Comment: Performed by a 3rd Generation assay with a functional sensitivity of <=0.01 uIU/mL. Performed at [facility], [address] [place], [place] [address]   Glucose, capillary     Status: Abnormal   Collection Time: 08/17/21  7:50 AM  Result Value Ref Range   Glucose-Capillary 199 (H) 70 - 99 mg/dL    Comment: Glucose reference range applies only to samples taken after fasting for at least 8 hours.  Urinalysis, Routine w reflex microscopic Urine, Clean Catch     Status: Abnormal   Collection Time: 08/17/21 11:00 AM  Result Value Ref Range   Color, Urine YELLOW YELLOW   APPearance CLEAR CLEAR   Specific Gravity, Urine 1.013 1.005 - 1.030   pH 5.0 5.0 - 8.0   Glucose, UA NEGATIVE NEGATIVE mg/dL   Hgb urine dipstick NEGATIVE NEGATIVE   Bilirubin Urine NEGATIVE NEGATIVE   Ketones, ur NEGATIVE NEGATIVE mg/dL   Protein, ur 100 (A) NEGATIVE mg/dL   Nitrite NEGATIVE NEGATIVE   Leukocytes,Ua NEGATIVE NEGATIVE   WBC, UA 0-5 0 - 5 WBC/hpf   Bacteria, UA NONE SEEN NONE SEEN   Mucus PRESENT    Hyaline Casts, UA PRESENT     Comment: Performed at [facility], [address] [place], [place] [address]  Glucose, capillary     Status: Abnormal   Collection Time: 08/17/21 11:49 AM  Result Value Ref Range   Glucose-Capillary 177 (H) 70 - 99 mg/dL    Comment: Glucose reference range applies only to samples taken after fasting for at least 8 hours.  Glucose, capillary     Status: Abnormal   Collection Time: 08/17/21  4:31 PM  Result Value Ref Range   Glucose-Capillary 236 (H) 70 - 99 mg/dL    Comment: Glucose reference range applies only to samples taken after fasting for at least 8 hours.  Glucose, capillary     Status: Abnormal   Collection Time: 08/17/21  8:47 PM  Result Value Ref Range   Glucose-Capillary 122 (H) 70 - 99 mg/dL    Comment: Glucose reference range applies only to samples taken after fasting for at least 8 hours.  Basic metabolic panel     Status: Abnormal    Collection Time: 08/18/21  6:22 AM  Result Value Ref Range   Sodium 135 135 - 145 mmol/L   Potassium 5.5 (H) 3.5 - 5.1 mmol/L   Chloride 107 98 - 111 mmol/L   CO2 18 (L) 22 - 32 mmol/L   Glucose, Bld 160 (H) 70 - 99 mg/dL    Comment: Glucose reference range applies only to samples taken after fasting for at least 8 hours.   BUN 73 (H) 8 - 23 mg/dL   Creatinine, Ser 3.86 (H) 0.44 - 1.00 mg/dL   Calcium 6.4 (LL) 8.9 - 10.3 mg/dL    Comment: CRITICAL RESULT CALLED TO, READ BACK BY AND VERIFIED WITH: KILMER,T AT 6:50AM ON 08/18/21 BY FESTERMAN,C    GFR, Estimated 12 (L) >60 mL/min    Comment: (NOTE)  Calculated using the CKD-EPI Creatinine Equation (2021)    Anion gap 10 5 - 15    Comment: Performed at [facility], [address], [place], [place] [address]  Magnesium     Status: Abnormal   Collection Time: 08/18/21  6:22 AM  Result Value Ref Range   Magnesium 1.6 (L) 1.7 - 2.4 mg/dL    Comment: Performed at [facility], [address] [place], [place] [address]  Phosphorus     Status: Abnormal   Collection Time: 08/18/21  6:22 AM  Result Value Ref Range   Phosphorus 5.7 (H) 2.5 - 4.6 mg/dL    Comment: Performed at [facility], [address] [place], [place] [address]  CBC with Differential/Platelet     Status: Abnormal   Collection Time: 08/18/21  6:22 AM  Result Value Ref Range   WBC 5.8 4.0 - 10.5 K/uL   RBC 2.36 (L) 3.87 - 5.11 MIL/uL   Hemoglobin 7.6 (L) 12.0 - 15.0 g/dL   HCT 23.8 (L) 36.0 - 46.0 %   MCV 100.8 (H) 80.0 - 100.0 fL   MCH 32.2 26.0 - 34.0 pg   MCHC 31.9 30.0 - 36.0 g/dL   RDW 15.6 (H) 11.5 - 15.5 %   Platelets 134 (L) 150 - 400 K/uL   nRBC 0.0 0.0 - 0.2 %   Neutrophils Relative % 55 %   Neutro Abs 3.1 1.7 - 7.7 K/uL   Lymphocytes Relative 34 %   Lymphs Abs 2.0 0.7 - 4.0 K/uL   Monocytes Relative 8 %   Monocytes Absolute 0.5 0.1 - 1.0 K/uL   Eosinophils Relative 2 %   Eosinophils Absolute 0.1 0.0 - 0.5 K/uL   Basophils Relative 1 %   Basophils  Absolute 0.0 0.0 - 0.1 K/uL   Immature Granulocytes 0 %   Abs Immature Granulocytes 0.02 0.00 - 0.07 K/uL    Comment: Performed at [facility], [address], [place], [place] [address]  Glucose, capillary     Status: Abnormal   Collection Time: 08/18/21  7:37 AM  Result Value Ref Range   Glucose-Capillary 136 (H) 70 - 99 mg/dL    Comment: Glucose reference range applies only to samples taken after fasting for at least 8 hours.     ROS:  Pertinent items noted in HPI and remainder of comprehensive ROS otherwise negative.  Physical Exam: Vitals:   08/18/21 0513 08/18/21 0910  BP: 99/87 (!) 132/57  Pulse: 68 73  Resp: 20 20  Temp: 97.7 F (36.5 C)   SpO2: 100% 100%     General exam: Appears calm and comfortable  Respiratory system: Clear to auscultation. Respiratory effort normal. No wheezing or crackle Cardiovascular system: S1 & S2 heard, RRR.  No pedal edema. Gastrointestinal system: Abdomen is nondistended, soft and nontender. Normal bowel sounds heard. Central nervous system: Alert and oriented. No focal neurological deficits. Extremities: No cyanosis, no edema.. Skin: Petechial rashes in both upper and lower extremities, nonblanching. Psychiatry: Judgement and insight appear normal. Mood & affect appropriate.   Assessment/Plan:  #Acute kidney injury on CKD stage IIIb likely due to hypertensive emergency on arrival concomitant with hemodynamically mediated due to diarrhea versus acute GN.  The creatinine level worsened further today probably because of hypotension, SBP 90s overnight.  The CKD was thought to be due to diabetic nephropathy and was followed by Dr. Theador Hawthorne. Given, new onset of rash after recent use of oral antibiotics there is concern of AIN versus underlying GN.  In AIN, I would suspect WBC in the urine.  I will quantify proteinuria and send serology including ANA, ANCA, C3, C4, hep B, hep C, anti-GBM, free light chain (k/lamda ratio).  The kidney ultrasound was  done this morning, result is pending.  Recommend continuing IV fluid till tomorrow morning and reassess the lab result. Depending on the lab result she may need kidney biopsy for definitive diagnosis.  Continue strict ins and out, daily lab monitoring.  Please avoid indomethacin/NSAIDs and IV contrast unless absolutely necessary.  #Diplopia/acute headache: MRI with chronic finding.  Seen by neurology.  Further work-up ongoing.  The pain around the neck area can be due to cervical spine disease.  #Hypertensive emergency on arrival: The blood pressure level improved after antihypertensive.  In fact, the blood pressure is low this morning.  I will discontinue clonidine and hydralazine.  Continue metoprolol and monitor blood pressure.  Discussed with the nursing staff.  #Hypomagnesemia due to GI loss in the setting of chronic diarrhea.  Patient reports BM around 10 times in 24 hours mostly watery.  #Non-anion gap metabolic acidosis due to diarrhea: I will start oral sodium bicarbonate.  #Mild hyperkalemia: She was hypokalemic on arrival.  Continue IV fluid and repeat lab in the morning.  # CKD-MBD: check PTH level, noted hypocalcemia and received IV calcium. Check phos level with renal panel.   # Anemia of CKD: receives esa as outpatient, iron sat 27 % on 10/5. Hold ESA while CNS workup going on. DB  Thank you for the consult, we will review chart over the weekend.  Please call with question.  Panzy Bubeck Tanna Furry 08/18/2021, 10:35 AM  [employer].

## 2021-08-19 ENCOUNTER — Inpatient Hospital Stay (HOSPITAL_COMMUNITY): Payer: 59

## 2021-08-19 ENCOUNTER — Encounter (HOSPITAL_COMMUNITY): Payer: Self-pay | Admitting: Internal Medicine

## 2021-08-19 DIAGNOSIS — R079 Chest pain, unspecified: Secondary | ICD-10-CM

## 2021-08-19 DIAGNOSIS — E872 Acidosis, unspecified: Secondary | ICD-10-CM | POA: Diagnosis not present

## 2021-08-19 DIAGNOSIS — J9621 Acute and chronic respiratory failure with hypoxia: Secondary | ICD-10-CM | POA: Diagnosis not present

## 2021-08-19 DIAGNOSIS — H538 Other visual disturbances: Secondary | ICD-10-CM | POA: Diagnosis not present

## 2021-08-19 LAB — URINE CULTURE: Culture: NO GROWTH

## 2021-08-19 LAB — CBC
HCT: 27.2 % — ABNORMAL LOW (ref 36.0–46.0)
Hemoglobin: 8.6 g/dL — ABNORMAL LOW (ref 12.0–15.0)
MCH: 31.7 pg (ref 26.0–34.0)
MCHC: 31.6 g/dL (ref 30.0–36.0)
MCV: 100.4 fL — ABNORMAL HIGH (ref 80.0–100.0)
Platelets: 192 10*3/uL (ref 150–400)
RBC: 2.71 MIL/uL — ABNORMAL LOW (ref 3.87–5.11)
RDW: 15.7 % — ABNORMAL HIGH (ref 11.5–15.5)
WBC: 4.8 10*3/uL (ref 4.0–10.5)
nRBC: 0.4 % — ABNORMAL HIGH (ref 0.0–0.2)

## 2021-08-19 LAB — GLUCOSE, CAPILLARY
Glucose-Capillary: 104 mg/dL — ABNORMAL HIGH (ref 70–99)
Glucose-Capillary: 122 mg/dL — ABNORMAL HIGH (ref 70–99)
Glucose-Capillary: 149 mg/dL — ABNORMAL HIGH (ref 70–99)
Glucose-Capillary: 161 mg/dL — ABNORMAL HIGH (ref 70–99)

## 2021-08-19 LAB — COMPREHENSIVE METABOLIC PANEL
ALT: 15 U/L (ref 0–44)
AST: 22 U/L (ref 15–41)
Albumin: 3 g/dL — ABNORMAL LOW (ref 3.5–5.0)
Alkaline Phosphatase: 69 U/L (ref 38–126)
Anion gap: 11 (ref 5–15)
BUN: 79 mg/dL — ABNORMAL HIGH (ref 8–23)
CO2: 19 mmol/L — ABNORMAL LOW (ref 22–32)
Calcium: 7.6 mg/dL — ABNORMAL LOW (ref 8.9–10.3)
Chloride: 105 mmol/L (ref 98–111)
Creatinine, Ser: 3.88 mg/dL — ABNORMAL HIGH (ref 0.44–1.00)
GFR, Estimated: 12 mL/min — ABNORMAL LOW (ref >60)
Glucose, Bld: 133 mg/dL — ABNORMAL HIGH (ref 70–99)
Potassium: 5.3 mmol/L — ABNORMAL HIGH (ref 3.5–5.1)
Sodium: 135 mmol/L (ref 135–145)
Total Bilirubin: 0.6 mg/dL (ref 0.3–1.2)
Total Protein: 6.1 g/dL — ABNORMAL LOW (ref 6.5–8.1)

## 2021-08-19 LAB — BLOOD GAS, ARTERIAL
Acid-base deficit: 11.7 mmol/L — ABNORMAL HIGH (ref 0.0–2.0)
Bicarbonate: 14.6 mmol/L — ABNORMAL LOW (ref 20.0–28.0)
FIO2: 21
O2 Saturation: 72.8 %
Patient temperature: 37
pCO2 arterial: 37.8 mmHg (ref 32.0–48.0)
pH, Arterial: 7.211 — ABNORMAL LOW (ref 7.350–7.450)
pO2, Arterial: 47 mmHg — ABNORMAL LOW (ref 83.0–108.0)

## 2021-08-19 LAB — D-DIMER, QUANTITATIVE: D-Dimer, Quant: 1.71 ug/mL-FEU — ABNORMAL HIGH (ref 0.00–0.50)

## 2021-08-19 LAB — RENAL FUNCTION PANEL
Albumin: 3.1 g/dL — ABNORMAL LOW (ref 3.5–5.0)
Anion gap: 13 (ref 5–15)
BUN: 78 mg/dL — ABNORMAL HIGH (ref 8–23)
CO2: 18 mmol/L — ABNORMAL LOW (ref 22–32)
Calcium: 7.6 mg/dL — ABNORMAL LOW (ref 8.9–10.3)
Chloride: 105 mmol/L (ref 98–111)
Creatinine, Ser: 3.93 mg/dL — ABNORMAL HIGH (ref 0.44–1.00)
GFR, Estimated: 12 mL/min — ABNORMAL LOW (ref >60)
Glucose, Bld: 133 mg/dL — ABNORMAL HIGH (ref 70–99)
Phosphorus: 7 mg/dL — ABNORMAL HIGH (ref 2.5–4.6)
Potassium: 5.3 mmol/L — ABNORMAL HIGH (ref 3.5–5.1)
Sodium: 136 mmol/L (ref 135–145)

## 2021-08-19 LAB — C4 COMPLEMENT: Complement C4, Body Fluid: 28 mg/dL (ref 12–38)

## 2021-08-19 LAB — HOMOCYSTEINE: Homocysteine: 16.3 umol/L (ref 0.0–17.2)

## 2021-08-19 LAB — TROPONIN I (HIGH SENSITIVITY)
Troponin I (High Sensitivity): 11 ng/L (ref <18)
Troponin I (High Sensitivity): 60 ng/L — ABNORMAL HIGH (ref <18)

## 2021-08-19 LAB — C3 COMPLEMENT: C3 Complement: 124 mg/dL (ref 82–167)

## 2021-08-19 LAB — BRAIN NATRIURETIC PEPTIDE: B Natriuretic Peptide: 430 pg/mL — ABNORMAL HIGH (ref 0.0–100.0)

## 2021-08-19 LAB — MAGNESIUM: Magnesium: 1.7 mg/dL (ref 1.7–2.4)

## 2021-08-19 LAB — PROCALCITONIN: Procalcitonin: 0.31 ng/mL

## 2021-08-19 LAB — PARATHYROID HORMONE, INTACT (NO CA): PTH: 515 pg/mL — ABNORMAL HIGH (ref 15–65)

## 2021-08-19 MED ORDER — MAGNESIUM SULFATE 2 GM/50ML IV SOLN
2.0000 g | Freq: Once | INTRAVENOUS | Status: AC
  Administered 2021-08-19: 2 g via INTRAVENOUS
  Filled 2021-08-19: qty 50

## 2021-08-19 MED ORDER — TECHNETIUM TO 99M ALBUMIN AGGREGATED
4.0000 | Freq: Once | INTRAVENOUS | Status: AC | PRN
  Administered 2021-08-19: 4.4 via INTRAVENOUS

## 2021-08-19 MED ORDER — ENOXAPARIN SODIUM 30 MG/0.3ML IJ SOSY: 30.0000 mg | PREFILLED_SYRINGE | INTRAMUSCULAR | Status: DC

## 2021-08-19 MED ORDER — ATORVASTATIN CALCIUM 80 MG PO TABS
80.0000 mg | ORAL_TABLET | Freq: Every day | ORAL | Status: DC
  Administered 2021-08-19 – 2021-09-05 (×14): 80 mg via ORAL
  Filled 2021-08-19: qty 2
  Filled 2021-08-19 (×9): qty 1
  Filled 2021-08-19: qty 2
  Filled 2021-08-19: qty 1
  Filled 2021-08-19 (×3): qty 2
  Filled 2021-08-19: qty 1

## 2021-08-19 MED ORDER — LACTATED RINGERS IV SOLN: INTRAVENOUS | Status: DC

## 2021-08-19 MED ORDER — FUROSEMIDE 10 MG/ML IJ SOLN
40.0000 mg | Freq: Once | INTRAMUSCULAR | Status: AC
  Administered 2021-08-19: 40 mg via INTRAVENOUS
  Filled 2021-08-19: qty 4

## 2021-08-19 MED ORDER — HEPARIN SODIUM (PORCINE) 5000 UNIT/ML IJ SOLN
5000.0000 [IU] | Freq: Three times a day (TID) | INTRAMUSCULAR | Status: DC
  Administered 2021-08-20 – 2021-08-23 (×10): 5000 [IU] via SUBCUTANEOUS
  Filled 2021-08-19 (×10): qty 1

## 2021-08-19 MED ORDER — SODIUM ZIRCONIUM CYCLOSILICATE 10 G PO PACK
10.0000 g | PACK | Freq: Once | ORAL | Status: AC
  Administered 2021-08-19: 10 g via ORAL
  Filled 2021-08-19: qty 1

## 2021-08-19 MED ORDER — DM-GUAIFENESIN ER 30-600 MG PO TB12
1.0000 | ORAL_TABLET | Freq: Two times a day (BID) | ORAL | Status: DC
  Administered 2021-08-19 – 2021-08-23 (×7): 1 via ORAL
  Filled 2021-08-19 (×9): qty 1

## 2021-08-19 MED ORDER — VANCOMYCIN HCL 750 MG/150ML IV SOLN
750.0000 mg | INTRAVENOUS | Status: DC
  Administered 2021-08-19 – 2021-08-21 (×2): 750 mg via INTRAVENOUS
  Filled 2021-08-19 (×4): qty 150

## 2021-08-19 MED ORDER — MIDODRINE HCL 5 MG PO TABS
10.0000 mg | ORAL_TABLET | Freq: Three times a day (TID) | ORAL | Status: DC
  Administered 2021-08-19 – 2021-08-20 (×2): 10 mg via ORAL
  Filled 2021-08-19 (×3): qty 2

## 2021-08-19 MED ORDER — ENOXAPARIN SODIUM 80 MG/0.8ML IJ SOSY
70.0000 mg | PREFILLED_SYRINGE | Freq: Once | INTRAMUSCULAR | Status: AC
  Administered 2021-08-19: 70 mg via SUBCUTANEOUS
  Filled 2021-08-19: qty 0.8

## 2021-08-19 MED ORDER — SODIUM CHLORIDE 0.9 % IV SOLN
2.0000 g | INTRAVENOUS | Status: DC
  Administered 2021-08-19 – 2021-08-22 (×4): 2 g via INTRAVENOUS
  Filled 2021-08-19 (×4): qty 2

## 2021-08-19 MED ORDER — ENOXAPARIN SODIUM 80 MG/0.8ML IJ SOSY: 70.0000 mg | PREFILLED_SYRINGE | Freq: Once | INTRAMUSCULAR | Status: DC

## 2021-08-19 MED ORDER — CHLORHEXIDINE GLUCONATE CLOTH 2 % EX PADS
6.0000 | MEDICATED_PAD | Freq: Every day | CUTANEOUS | Status: DC
  Administered 2021-08-19 – 2021-09-06 (×19): 6 via TOPICAL

## 2021-08-19 MED ORDER — ENOXAPARIN SODIUM 40 MG/0.4ML IJ SOSY: 40.0000 mg | PREFILLED_SYRINGE | Freq: Once | INTRAMUSCULAR | Status: DC

## 2021-08-19 MED ORDER — ENOXAPARIN SODIUM 40 MG/0.4ML IJ SOSY: 40.0000 mg | PREFILLED_SYRINGE | INTRAMUSCULAR | Status: DC

## 2021-08-19 NOTE — Progress Notes (Signed)
RAPID RESPONSE  Rapid response was called due to patient having sudden increased work of breathing and hypoxia.  O2 sat was 68%.  Supplemental oxygen at 4 L/min was provided with improvement in O2 sats to 89-90%.  At bedside, patient was in mild respiratory distress, however, she was alert and oriented x3.  She complained of left-sided chest pain which was reproducible and also complained of back pain. CBC, CMP, D-dimer, magnesium level, troponin and ABG were ordered. Chest x-ray was ordered ABG: pH 7.211, PCO2 37.3, PO2 41.0, bicarb 14.6 Chest x-ray showed Multifocal bilateral airspace disease. Some of these opacities have a nodular configuration. In the acute setting findings are consistent with multifocal pneumonia. Given the nodular configuration of some of these opacities metastatic disease would be difficult to exclude. Consider more definitive characterization with CT of the chest with contrast material. 2. New bilateral hilar prominence which may reflect underlying adenopathy.  Physical Exam Blood pressure (!) 107/53, pulse 94, temperature (!) 97.4 F (36.3 C), temperature source Oral, resp. rate 18, height 5' 2.99" (1.6 m), weight 68 kg, SpO2 (!) 89 %.  Gen:- Awake Alert, oriented x3 HEENT:- [place].AT, No sclera icterus Neck-Supple Neck,No JVD,.  Lungs-diffuse coarse breath sounds CV-reproducible chest pain.  S1, S2 normal Abd-  +ve B.Sounds, Abd Soft, No tenderness,    Extremity/Skin:- No  edema,    Psych-affect is appropriate, oriented x3 Neuro-no new focal deficits, no tremors  Assessment and plan Acute respiratory failure with hypoxia due to possible multifocal pneumonia ABG showed hypoxemia, though, ABG appears to be metabolic possibly due to patient's CKD Chest x-ray was suggestive of multifocal pneumonia Patient presents with new onset nonproductive cough She will be empirically started on IV vancomycin and cefepime with plan to de-escalate/discontinue based on blood culture,  procalcitonin, urine Legionella, strep pneumo Continue Mucinex, incentive spirometry, flutter valve Continue supplemental oxygen to maintain O2 sats greater than 92% with plan to wean patient off supplemental oxygen as tolerated Chest x-ray personally reviewed showed diffuse fluffy infiltrates, IV fluid was stopped, continue incentive spirometry IV Lasix 40 mg x 1 was given D-dimer was 1.71, CT of chest cannot be done due to patient's current kidney status, VQ scan will be done in the morning to rule out PE, in the meantime, therapeutic dose of Lovenox x1 will be given.  Metabolic acidosis possibly secondary to acute kidney injury CKD stage 3 Nephrology is following  Possible metastatic disease per chest x-ray Chest x-ray was suspicious of metastatic disease and CT of the chest with contrast was recommended This will be temporarily held at this time due to the patient's kidney status  Chest pain and back pain Patient has a history of cervical spine fusion per medical record Pain was reproducible, this appears to be musculoskeletal Continue IV Dilaudid as needed Troponin x 1 was negative  Critical time: 55 minutes   Critical care personally provided  managing the patient due to high probability of clinically significant and life threatening deterioration. This critical care time included obtaining a history; examining the patient, pulse oximetry; ordering and review of studies; arranging urgent treatment with development of a management plan; evaluation of patient's response of treatment; frequent reassessment; and discussions with other providers.  This critical care time was performed to assess and manage the high probability of imminent and life threatening deterioration that could result in multi-organ failure.

## 2021-08-19 NOTE — Progress Notes (Signed)
Pharmacy Antibiotic Note  Heather [name] is a 70 y.o. female admitted on 08/16/2021 with pneumonia.  Pharmacy has been consulted for Vancomycin/Cefepime dosing. WBC WNL. Noted renal dysfunction. CXR with likely multi-focal pneumonia.   Plan: Vancomycin 750 mg IV q48h >>Estimated AUC: 468 Cefepime 2g IV q24h Trend WBC, temp, renal function  F/U infectious work-up Drug levels as indicated   Height: 5\' 4"  (162.6 cm) Weight: 77.9 kg (171 lb 11.8 oz) IBW/kg (Calculated) : 54.7  Temp (24hrs), Avg:97.9 F (36.6 C), Min:97.4 F (36.3 C), Max:98.9 F (37.2 C)  Recent Labs  Lab 08/16/21 1206 08/17/21 0542 08/18/21 0622 08/19/21 0620  WBC 9.4 9.0 5.8 4.8  CREATININE 3.23* 3.21* 3.86*  --     Estimated Creatinine Clearance: 13.9 mL/min (A) (by C-G formula based on SCr of 3.86 mg/dL (H)).    Allergies  Allergen Reactions   Duloxetine Hcl Swelling   Fluocinolone Swelling   Acetaminophen Itching and Swelling    Pt states "some swelling"   Amlodipine Besylate Swelling    Swelling of feet, fluid retention   Ciprofloxacin Nausea And Vomiting and Swelling    Swelling of face, jaw, and lips   Hydrocodone-Acetaminophen Itching   Metformin Other (See Comments)    REACTION: GI UPSET   Nsaids Other (See Comments)    "HAS BLEEDING ULCERS"   Quinolones Swelling   Sulfamethoxazole Swelling    Swelling of feet, legs   Valsartan Swelling   Cozaar [Losartan] Swelling and Rash   Lisinopril Swelling and Rash    Oral swelling, and red streaks on arms/stomach   Tape Itching and Rash    Paper tape can only be used on this patient    Narda Bonds, PharmD, [place] Pharmacist Phone: [phone]

## 2021-08-19 NOTE — Progress Notes (Signed)
In patient room during rounds and observed patient audibly wheezing from the door. Patient appeared pale. Patient answered questions appropriately. Patient also stated next that people were lining up in her room and asking her several questions. Writer than obtained pulse ox: 68% on room air. Patient stated her chest started hurting with increase of work of breathing. Congested weak cough noted. Charge nurse notified and rapid response called.

## 2021-08-19 NOTE — Progress Notes (Signed)
ANTICOAGULATION CONSULT NOTE - Initial Consult  Pharmacy Consult for Lovenox  Indication: R/O PE  Allergies  Allergen Reactions   Duloxetine Hcl Swelling   Fluocinolone Swelling   Acetaminophen Itching and Swelling    Pt states "some swelling"   Amlodipine Besylate Swelling    Swelling of feet, fluid retention   Ciprofloxacin Nausea And Vomiting and Swelling    Swelling of face, jaw, and lips   Hydrocodone-Acetaminophen Itching   Metformin Other (See Comments)    REACTION: GI UPSET   Nsaids Other (See Comments)    "HAS BLEEDING ULCERS"   Quinolones Swelling   Sulfamethoxazole Swelling    Swelling of feet, legs   Valsartan Swelling   Cozaar [Losartan] Swelling and Rash   Lisinopril Swelling and Rash    Oral swelling, and red streaks on arms/stomach   Tape Itching and Rash    Paper tape can only be used on this patient    Patient Measurements: Height: 5\' 4"  (162.6 cm) Weight: 77.9 kg (171 lb 11.8 oz) IBW/kg (Calculated) : 54.7  Vital Signs: Temp: 98 F (36.7 C) (10/22 0747) Temp Source: Oral (10/22 0747) BP: 83/33 (10/22 1300) Pulse Rate: 74 (10/22 1300)  Labs: Recent Labs    08/17/21 0542 08/18/21 0622 08/19/21 0504 08/19/21 0511 08/19/21 0620 08/19/21 0738  HGB 9.1* 7.6*  --   --  8.6*  --   HCT 29.5* 23.8*  --   --  27.2*  --   PLT 187 134*  --   --  192  --   CREATININE 3.21* 3.86* 3.93* 3.88*  --   --   TROPONINIHS  --   --   --  11  --  60*     Estimated Creatinine Clearance: 13.8 mL/min (A) (by C-G formula based on SCr of 3.88 mg/dL (H)).   Medical History: Past Medical History:  Diagnosis Date   Acute renal failure superimposed on stage 3 chronic kidney disease ([place]) 01/21/2016   Anemia    Anxiety    B12 deficiency    Brain tumor (benign) ([place]) 10/29/2020   Cellulitis and abscess    Abdomen and buttocks   CKD (chronic kidney disease) stage 3, GFR 30-59 ml/min ([facility])    Closed fracture of distal end of femur, unspecified fracture  morphology, initial encounter ([place])    Depression    Diabetic neuropathy (HCC)    feet   DJD (degenerative joint disease)    Right forminal stenosis C4-5   Essential hypertension    Femur fracture, right ([place]) 03/31/1307   Folliculitis    GERD (gastroesophageal reflux disease)    Gout    Headache(784.0)    Hiatal hernia    History of blood transfusion    History of bronchitis    History of cardiac catheterization    No significant CAD 2012   History of kidney stones    surgery to remove   Insomnia    Irritable bowel syndrome    Mixed hyperlipidemia    Osteoarthritis    Palpitations    Pneumonia    Reflux esophagitis    Salmonella    Stroke [facility])    Tinnitus    Right   Type 2 diabetes mellitus (HCC)     Medications:  Scheduled:   allopurinol  100 mg Oral Daily   atorvastatin  80 mg Oral QHS   Chlorhexidine Gluconate Cloth  6 each Topical Daily   citalopram  20 mg Oral Daily   dextromethorphan-guaiFENesin  1  tablet Oral BID   dicyclomine  20 mg Oral TID AC   [START ON 08/20/2021] enoxaparin (LOVENOX) injection  70 mg Subcutaneous Once   gabapentin  300 mg Oral BID   insulin aspart  0-9 Units Subcutaneous TID WC   insulin detemir  15 Units Subcutaneous QHS   lipase/protease/amylase  36,000-72,000 Units Oral See admin instructions   metoprolol tartrate  100 mg Oral BID   pantoprazole  40 mg Oral Daily   sodium bicarbonate  650 mg Oral TID   venlafaxine XR  150 mg Oral Daily    Assessment: 70 y.o. female with SOB, possible PE, for Lovenox.    Goal of Therapy:  Full anticoagulation with Lovenox Monitor platelets by anticoagulation protocol: Yes   Plan:  Lovenox 70 mg SQ X 1 tomorrow morning  F/U VQ scan   Heather [name] 08/19/2021,1:34 PM

## 2021-08-19 NOTE — Progress Notes (Addendum)
PROGRESS NOTE    Heather [name]  TML:[id number] DOB: 07-21-1951 DOA: 08/16/2021 PCP: [name], MD   Brief Narrative:   70 year old female with history of CKD stage IIIb, Crohn's disease and IBS with chronic diarrhea, hypertension, type 2 diabetes, BPPV who presented to the emergency room with blurry vision and diplopia for 2 days along with stabbing headache mostly on the back of her head.  She had urinary symptoms and was treated with doxycycline since last 7 days.  She also developed diffuse rash while taking it.  In the emergency room afebrile.  Heart rate normal.  Blood pressure systolic 681-275.  Patient was given labetalol in the ER and blood pressure improved.  Neurology was consulted.  MRI of the brain and MRA/MRV was done and was essentially normal.  She was also found to be suffering with acute kidney injury on CKDIIIb.  She is noted to have some ongoing acidosis as well as hyperkalemia.  Now with some hypotension as well.  Overnight has had worsening respiratory failure with suspected pulmonary edema related to heart failure versus multifocal pneumonia.  Assessment & Plan:   Principal Problem:   Blurry vision, bilateral Active Problems:   Essential hypertension   Depression   Acute kidney injury superimposed on CKD (HCC)   Crohn's disease of small intestine with other complication (HCC)   Hypomagnesemia   Acute hypokalemia   Acute hypoxemic respiratory failure-multifactorial with multifocal pneumonia versus acute diastolic CHF exacerbation -Given Lasix on 10/22 with 600 mL UO noted on bladder scan, will place Foley catheter -Avoid further IV fluid -Monitor strict I's and O's and daily weights -Procalcitonin borderline, continue current IV antibiotics -Check BNP and 2D echocardiogram -VQ scan with no indication for PE  AKI on CKD stage IIIb -Appreciate nephrology ongoing evaluation -Renal ultrasound with no significant findings -Multiple labs ordered and  currently pending -Foley catheter to be inserted to monitor strict I's and O's given urinary retention  Hypertensive crisis, now with hypotension -Started on midodrine today to attempt to avoid vasopressor use -Multiple blood pressure medications to include hydralazine and clonidine discontinued -Metoprolol to continue with holding parameters  Acute diplopia and headaches-resolved -Unclear etiology and assessed by neurology -Likely was related to severe hypomagnesemia which has not been corrected -Continue to monitor repeat magnesium levels -Muscle relaxers as needed  Non-anion gap metabolic acidosis -Related to diarrhea and possibly AKI -Continue sodium bicarbonate tablets -Monitor BMP  Hyperkalemia -Lokelma x1 dose 10/22 -Monitor on telemetry with repeat BMP in a.m.  Type 2 diabetes-controlled -Keep on SSI  Crohn's disease with chronic diarrhea -Humira to resume outpatient -Continue Bentyl  Skin rash -Primary cause is unknown, but started after doxycycline and Diflucan likely drug reaction -Continue to monitor  Anemia of chronic disease -Possibly related to CKD -Hemoglobin currently stable, continue to monitor CBC   DVT prophylaxis: Heparin Code Status: Full Family Communication: Discussed with husband on phone 10/22 Disposition Plan:  Status is: Inpatient  Remains inpatient appropriate because: Patient is currently unstable with hypotension and requires ICU level monitoring as well as IV medications.   Consultants:  Nephrology Neurology  Procedures:  See below  Antimicrobials:  Anti-infectives (From admission, onward)    Start     Dose/Rate Route Frequency Ordered Stop   08/19/21 0745  ceFEPIme (MAXIPIME) 2 g in sodium chloride 0.9 % 100 mL IVPB        2 g 200 mL/hr over 30 Minutes Intravenous Every 24 hours 08/19/21 0651     08/19/21 0745  vancomycin (VANCOREADY) IVPB 750 mg/150 mL        750 mg 150 mL/hr over 60 Minutes Intravenous Every 48 hours  08/19/21 0651     08/18/21 0945  fluconazole (DIFLUCAN) tablet 150 mg        150 mg Oral  Once 08/18/21 0853 08/18/21 0951       Subjective: Patient seen and evaluated today with worsening hypoxemia noted overnight with chest x-ray demonstrating possible volume overload versus multifocal pneumonia.  She had been started on IV antibiotics with IV Lasix administered.  Objective: Vitals:   08/19/21 1230 08/19/21 1300 08/19/21 1333 08/19/21 1338  BP:  (!) 83/33 (!) 73/33 (!) 82/31  Pulse: (!) 113 74 75 72  Resp: 19 15 17 15   Temp:      TempSrc:      SpO2: (!) 88% 100% 100% 100%  Weight:      Height:        Intake/Output Summary (Last 24 hours) at 08/19/2021 1429 Last data filed at 08/19/2021 0358 Gross per 24 hour  Intake 2028.89 ml  Output --  Net 2028.89 ml   Filed Weights   08/16/21 1058 08/19/21 0600  Weight: 68 kg 77.9 kg    Examination:  General exam: Appears calm and comfortable  Respiratory system: Clear to auscultation. Respiratory effort normal.  Currently on 8 L nasal cannula oxygen. Cardiovascular system: S1 & S2 heard, RRR.  Gastrointestinal system: Abdomen is soft Central nervous system: Alert and awake Extremities: No edema Skin: No significant lesions noted Psychiatry: Flat affect.    Data Reviewed: I have personally reviewed following labs and imaging studies  CBC: Recent Labs  Lab 08/16/21 1206 08/17/21 0542 08/18/21 0622 08/19/21 0620  WBC 9.4 9.0 5.8 4.8  NEUTROABS 6.6  --  3.1  --   HGB 9.7* 9.1* 7.6* 8.6*  HCT 30.4* 29.5* 23.8* 27.2*  MCV 95.9 100.3* 100.8* 100.4*  PLT 216 187 134* 366   Basic Metabolic Panel: Recent Labs  Lab 08/16/21 1206 08/17/21 0542 08/18/21 0622 08/19/21 0504 08/19/21 0511  NA 139 137 135 136 135  K 3.1* 4.9 5.5* 5.3* 5.3*  CL 106 109 107 105 105  CO2 21* 17* 18* 18* 19*  GLUCOSE 43* 221* 160* 133* 133*  BUN 56* 58* 73* 78* 79*  CREATININE 3.23* 3.21* 3.86* 3.93* 3.88*  CALCIUM 6.5* 6.5* 6.4* 7.6*  7.6*  MG 0.7* 2.1 1.6*  --  1.7  PHOS  --   --  5.7* 7.0*  --    GFR: Estimated Creatinine Clearance: 13.8 mL/min (A) (by C-G formula based on SCr of 3.88 mg/dL (H)). Liver Function Tests: Recent Labs  Lab 08/16/21 1206 08/19/21 0504 08/19/21 0511  AST 21  --  22  ALT 17  --  15  ALKPHOS 57  --  69  BILITOT 1.0  --  0.6  PROT 6.9  --  6.1*  ALBUMIN 3.4* 3.1* 3.0*   No results for input(s): LIPASE, AMYLASE in the last 168 hours. No results for input(s): AMMONIA in the last 168 hours. Coagulation Profile: No results for input(s): INR, PROTIME in the last 168 hours. Cardiac Enzymes: No results for input(s): CKTOTAL, CKMB, CKMBINDEX, TROPONINI in the last 168 hours. BNP (last 3 results) No results for input(s): PROBNP in the last 8760 hours. HbA1C: No results for input(s): HGBA1C in the last 72 hours. CBG: Recent Labs  Lab 08/18/21 1224 08/18/21 1643 08/18/21 2153 08/19/21 0749 08/19/21 1317  GLUCAP 74 104* 97  104* 122*   Lipid Profile: No results for input(s): CHOL, HDL, LDLCALC, TRIG, CHOLHDL, LDLDIRECT in the last 72 hours. Thyroid Function Tests: Recent Labs    08/17/21 0544  TSH 3.426   Anemia Panel: No results for input(s): VITAMINB12, FOLATE, FERRITIN, TIBC, IRON, RETICCTPCT in the last 72 hours. Sepsis Labs: Recent Labs  Lab 08/19/21 0620  PROCALCITON 0.31    Recent Results (from the past 240 hour(s))  Resp Panel by RT-PCR (Flu A&B, Covid) Nasopharyngeal Swab     Status: None   Collection Time: 08/16/21  8:28 PM   Specimen: Nasopharyngeal Swab; Nasopharyngeal(NP) swabs in vial transport medium  Result Value Ref Range Status   SARS Coronavirus 2 by RT PCR NEGATIVE NEGATIVE Final    Comment: (NOTE) SARS-CoV-2 target nucleic acids are NOT DETECTED.  The SARS-CoV-2 RNA is generally detectable in upper respiratory specimens during the acute phase of infection. The lowest concentration of SARS-CoV-2 viral copies this assay can detect is 138 copies/mL.  A negative result does not preclude SARS-Cov-2 infection and should not be used as the sole basis for treatment or other patient management decisions. A negative result may occur with  improper specimen collection/handling, submission of specimen other than nasopharyngeal swab, presence of viral mutation(s) within the areas targeted by this assay, and inadequate number of viral copies(<138 copies/mL). A negative result must be combined with clinical observations, patient history, and epidemiological information. The expected result is Negative.  Fact Sheet for Patients:  EntrepreneurPulse.com.au  Fact Sheet for Healthcare Providers:  IncredibleEmployment.be  This test is no t yet approved or cleared by the [place] FDA and  has been authorized for detection and/or diagnosis of SARS-CoV-2 by FDA under an Emergency Use Authorization (EUA). This EUA will remain  in effect (meaning this test can be used) for the duration of the COVID-19 declaration under Section 564(b)(1) of the Act, 21 U.S.C.section 360bbb-3(b)(1), unless the authorization is terminated  or revoked sooner.       Influenza A by PCR NEGATIVE NEGATIVE Final   Influenza B by PCR NEGATIVE NEGATIVE Final    Comment: (NOTE) The Xpert Xpress SARS-CoV-2/FLU/RSV plus assay is intended as an aid in the diagnosis of influenza from Nasopharyngeal swab specimens and should not be used as a sole basis for treatment. Nasal washings and aspirates are unacceptable for Xpert Xpress SARS-CoV-2/FLU/RSV testing.  Fact Sheet for Patients: EntrepreneurPulse.com.au  Fact Sheet for Healthcare Providers: IncredibleEmployment.be  This test is not yet approved or cleared by the [place] FDA and has been authorized for detection and/or diagnosis of SARS-CoV-2 by FDA under an Emergency Use Authorization (EUA). This EUA will remain in effect (meaning this test  can be used) for the duration of the COVID-19 declaration under Section 564(b)(1) of the Act, 21 U.S.C. section 360bbb-3(b)(1), unless the authorization is terminated or revoked.  Performed at [facility], [address] [place], [place] [address]   Urine Culture     Status: None   Collection Time: 08/17/21 11:00 AM   Specimen: Urine, Clean Catch  Result Value Ref Range Status   Specimen Description   Final    URINE, CLEAN CATCH Performed at [facility], [address] [place], [place] [address]    Special Requests   Final    NONE Performed at [facility], [address] [place], [place] [address]    Culture   Final    NO GROWTH Performed at [facility], [place] [address] [place], [place] [address]  Report Status 08/19/2021 FINAL  Final  Culture, blood (Routine X 2) w Reflex to ID Panel     Status: None (Preliminary result)   Collection Time: 08/19/21  7:28 AM   Specimen: BLOOD LEFT ARM  Result Value Ref Range Status   Specimen Description BLOOD LEFT ARM BOTTLES DRAWN AEROBIC AND ANAEROBIC  Final   Special Requests   Final    Blood Culture results may not be optimal due to an excessive volume of blood received in culture bottles Performed at [facility], [address] [place], [place] [address]    Culture PENDING  Incomplete   Report Status PENDING  Incomplete  Culture, blood (Routine X 2) w Reflex to ID Panel     Status: None (Preliminary result)   Collection Time: 08/19/21  8:27 AM   Specimen: BLOOD LEFT HAND  Result Value Ref Range Status   Specimen Description   Final    BLOOD LEFT HAND BOTTLES DRAWN AEROBIC AND ANAEROBIC   Special Requests   Final    Blood Culture adequate volume Performed at [facility], [address] [place],  [address]    Culture PENDING  Incomplete   Report Status PENDING  Incomplete         Radiology Studies: NM Pulmonary Perfusion  Result Date: 08/19/2021 CLINICAL DATA:  Shortness of breath and chest pain.  Positive D-dimer. EXAM: NUCLEAR MEDICINE PERFUSION LUNG SCAN TECHNIQUE: Perfusion images were obtained in multiple projections after intravenous injection of radiopharmaceutical. Ventilation scans intentionally deferred if perfusion scan and chest x-ray adequate for interpretation during COVID 19 epidemic. RADIOPHARMACEUTICALS:  4.4 mCi Tc-67m MAA IV COMPARISON:  Chest radiography same day. FINDINGS: No clear segmental or subsegmental defect. Mild diffuse patchy pattern consistent with the abnormal chest radiography showing widespread bilateral airspace filling. Consider CT scan of the chest (without contrast if the patient cannot tolerate contrast administration) for better evaluation of parenchymal disease. IMPRESSION: No arterial filling defect to suggest embolic disease. Mild patchy pattern consistent with a widespread airspace disease shown by radiography earlier today. Electronically Signed   By: Nelson Chimes M.D.   On: 08/19/2021 13:40   US RENAL  Result Date: 08/18/2021 CLINICAL DATA:  Acute kidney injury EXAM: RENAL / URINARY TRACT ULTRASOUND COMPLETE COMPARISON:  CT 01/07/2021 FINDINGS: Right Kidney: Renal measurements: 10.1 x 4.9 x 5.3 cm = volume: 139 mL. Slightly increased parenchymal echogenicity. No evidence of cyst, mass, stone or hydronephrosis. Left Kidney: Renal measurements: 9.6 x 5.4 x 5.1 cm = volume: 140 mL. Slightly increased echogenicity. 1 cm cyst in the midportion. Bladder: Appears normal for degree of bladder distention. Other: None. IMPRESSION: Normal size kidneys without hydronephrosis. Slight increased echogenicity of the renal parenchyma. Electronically Signed   By: Nelson Chimes M.D.   On: 08/18/2021 12:48   DG Chest Port 1 View  Result Date: 08/19/2021 CLINICAL DATA:  New onset shortness of breath. History of hypertension, diabetes and pneumonia. Negative COVID-19 test 3 days ago. EXAM: PORTABLE CHEST 1 VIEW COMPARISON:  02/27/2021 FINDINGS: Multifocal bilateral areas of  airspace disease identified involving both upper and lower lung zones. Some of these opacities have a nodular configuration. New bilateral hilar prominence is identified which may reflect underlying adenopathy. Trace pulmonary edema. No signs of pleural effusion. IMPRESSION: 1. Multifocal bilateral airspace disease. Some of these opacities have a nodular configuration. In the acute setting findings are consistent with multifocal pneumonia. Given the nodular configuration of some of these opacities metastatic disease would be difficult to exclude.  Consider more definitive characterization with CT of the chest with contrast material. 2. New bilateral hilar prominence which may reflect underlying adenopathy. Electronically Signed   By: Kerby Moors M.D.   On: 08/19/2021 06:22        Scheduled Meds:  allopurinol  100 mg Oral Daily   atorvastatin  80 mg Oral QHS   Chlorhexidine Gluconate Cloth  6 each Topical Daily   citalopram  20 mg Oral Daily   dextromethorphan-guaiFENesin  1 tablet Oral BID   dicyclomine  20 mg Oral TID AC   gabapentin  300 mg Oral BID   [START ON 08/20/2021] heparin injection (subcutaneous)  5,000 Units Subcutaneous Q8H   insulin aspart  0-9 Units Subcutaneous TID WC   insulin detemir  15 Units Subcutaneous QHS   lipase/protease/amylase  36,000-72,000 Units Oral See admin instructions   metoprolol tartrate  100 mg Oral BID   midodrine  10 mg Oral TID WC   pantoprazole  40 mg Oral Daily   sodium bicarbonate  650 mg Oral TID   sodium zirconium cyclosilicate  10 g Oral Once   venlafaxine XR  150 mg Oral Daily   Continuous Infusions:  ceFEPime (MAXIPIME) IV 2 g (08/19/21 0828)   vancomycin 750 mg (08/19/21 0715)     LOS: 2 days    Total critical care time: 55 minutes.    Gustavo Dispenza Darleen Crocker, DO [facility]  If 7PM-7AM, please contact night-coverage [email or web address] 08/19/2021, 2:29 PM

## 2021-08-19 NOTE — Progress Notes (Signed)
Sputum sample collector given to patient

## 2021-08-19 NOTE — Progress Notes (Signed)
ANTICOAGULATION CONSULT NOTE - Initial Consult  Pharmacy Consult for Lovenox  Indication: R/O PE  Allergies  Allergen Reactions   Duloxetine Hcl Swelling   Fluocinolone Swelling   Acetaminophen Itching and Swelling    Pt states "some swelling"   Amlodipine Besylate Swelling    Swelling of feet, fluid retention   Ciprofloxacin Nausea And Vomiting and Swelling    Swelling of face, jaw, and lips   Hydrocodone-Acetaminophen Itching   Metformin Other (See Comments)    REACTION: GI UPSET   Nsaids Other (See Comments)    "HAS BLEEDING ULCERS"   Quinolones Swelling   Sulfamethoxazole Swelling    Swelling of feet, legs   Valsartan Swelling   Cozaar [Losartan] Swelling and Rash   Lisinopril Swelling and Rash    Oral swelling, and red streaks on arms/stomach   Tape Itching and Rash    Paper tape can only be used on this patient    Patient Measurements: Height: 5' 2.99" (160 cm) Weight: 68 kg (150 lb) IBW/kg (Calculated) : 52.38  Vital Signs: Temp: 97.4 F (36.3 C) (10/21 2151) Temp Source: Oral (10/21 2151) BP: 137/54 (10/22 0600) Pulse Rate: 94 (10/22 0600)  Labs: Recent Labs    08/16/21 1206 08/17/21 0542 08/18/21 0622  HGB 9.7* 9.1* 7.6*  HCT 30.4* 29.5* 23.8*  PLT 216 187 134*  CREATININE 3.23* 3.21* 3.86*    Estimated Creatinine Clearance: 12.7 mL/min (A) (by C-G formula based on SCr of 3.86 mg/dL (H)).   Medical History: Past Medical History:  Diagnosis Date   Acute renal failure superimposed on stage 3 chronic kidney disease ([place]) 01/21/2016   Anemia    Anxiety    B12 deficiency    Brain tumor (benign) ([place]) 10/29/2020   Cellulitis and abscess    Abdomen and buttocks   CKD (chronic kidney disease) stage 3, GFR 30-59 ml/min (HCC)    Closed fracture of distal end of femur, unspecified fracture morphology, initial encounter ([place])    Depression    Diabetic neuropathy (HCC)    feet   DJD (degenerative joint disease)    Right forminal stenosis C4-5    Essential hypertension    Femur fracture, right ([place]) 05/30/9561   Folliculitis    GERD (gastroesophageal reflux disease)    Gout    Headache(784.0)    Hiatal hernia    History of blood transfusion    History of bronchitis    History of cardiac catheterization    No significant CAD 2012   History of kidney stones    surgery to remove   Insomnia    Irritable bowel syndrome    Mixed hyperlipidemia    Osteoarthritis    Palpitations    Pneumonia    Reflux esophagitis    Salmonella    Stroke (HCC)    Tinnitus    Right   Type 2 diabetes mellitus (HCC)     Medications:  Scheduled:   allopurinol  100 mg Oral Daily   atorvastatin  80 mg Oral q morning   Chlorhexidine Gluconate Cloth  6 each Topical Daily   citalopram  20 mg Oral Daily   dicyclomine  20 mg Oral TID AC   gabapentin  300 mg Oral BID   heparin  5,000 Units Subcutaneous Q8H   insulin aspart  0-9 Units Subcutaneous TID WC   insulin detemir  15 Units Subcutaneous QHS   lipase/protease/amylase  36,000-72,000 Units Oral See admin instructions   metoprolol tartrate  100 mg Oral BID  pantoprazole  40 mg Oral Daily   sodium bicarbonate  650 mg Oral TID   venlafaxine XR  150 mg Oral Daily    Assessment: 70 y.o. female with SOB, possible PE, for Lovenox.    Goal of Therapy:  Full anticoagulation with Lovenox Monitor platelets by anticoagulation protocol: Yes   Plan:  D/C SQ Heparin Lovenox 70 mg SQ X 1 F/U VQ scan   Heather [name], Bronson Curb 08/19/2021,6:18 AM

## 2021-08-20 ENCOUNTER — Inpatient Hospital Stay (HOSPITAL_COMMUNITY): Payer: 59

## 2021-08-20 DIAGNOSIS — N179 Acute kidney failure, unspecified: Secondary | ICD-10-CM | POA: Diagnosis not present

## 2021-08-20 DIAGNOSIS — N189 Chronic kidney disease, unspecified: Secondary | ICD-10-CM | POA: Diagnosis not present

## 2021-08-20 DIAGNOSIS — H538 Other visual disturbances: Secondary | ICD-10-CM | POA: Diagnosis not present

## 2021-08-20 LAB — BASIC METABOLIC PANEL
Anion gap: 14 (ref 5–15)
Anion gap: 12 (ref 5–15)
BUN: 89 mg/dL — ABNORMAL HIGH (ref 8–23)
BUN: 40 mg/dL — ABNORMAL HIGH (ref 8–23)
CO2: 14 mmol/L — ABNORMAL LOW (ref 22–32)
CO2: 23 mmol/L (ref 22–32)
Calcium: 7.2 mg/dL — ABNORMAL LOW (ref 8.9–10.3)
Calcium: 8 mg/dL — ABNORMAL LOW (ref 8.9–10.3)
Chloride: 106 mmol/L (ref 98–111)
Chloride: 99 mmol/L (ref 98–111)
Creatinine, Ser: 4.63 mg/dL — ABNORMAL HIGH (ref 0.44–1.00)
Creatinine, Ser: 2.88 mg/dL — ABNORMAL HIGH (ref 0.44–1.00)
GFR, Estimated: 10 mL/min — ABNORMAL LOW (ref >60)
GFR, Estimated: 17 mL/min — ABNORMAL LOW (ref >60)
Glucose, Bld: 146 mg/dL — ABNORMAL HIGH (ref 70–99)
Glucose, Bld: 106 mg/dL — ABNORMAL HIGH (ref 70–99)
Potassium: 6.2 mmol/L — ABNORMAL HIGH (ref 3.5–5.1)
Potassium: 3.4 mmol/L — ABNORMAL LOW (ref 3.5–5.1)
Sodium: 134 mmol/L — ABNORMAL LOW (ref 135–145)
Sodium: 134 mmol/L — ABNORMAL LOW (ref 135–145)

## 2021-08-20 LAB — CBC
HCT: 21.2 % — ABNORMAL LOW (ref 36.0–46.0)
Hemoglobin: 6.8 g/dL — CL (ref 12.0–15.0)
MCH: 32.2 pg (ref 26.0–34.0)
MCHC: 32.1 g/dL (ref 30.0–36.0)
MCV: 100.5 fL — ABNORMAL HIGH (ref 80.0–100.0)
Platelets: 115 10*3/uL — ABNORMAL LOW (ref 150–400)
RBC: 2.11 MIL/uL — ABNORMAL LOW (ref 3.87–5.11)
RDW: 15.8 % — ABNORMAL HIGH (ref 11.5–15.5)
WBC: 12.2 10*3/uL — ABNORMAL HIGH (ref 4.0–10.5)
nRBC: 0 % (ref 0.0–0.2)

## 2021-08-20 LAB — PROTIME-INR
INR: 1.6 — ABNORMAL HIGH (ref 0.8–1.2)
Prothrombin Time: 19 seconds — ABNORMAL HIGH (ref 11.4–15.2)

## 2021-08-20 LAB — GLUCOSE, CAPILLARY
Glucose-Capillary: 155 mg/dL — ABNORMAL HIGH (ref 70–99)
Glucose-Capillary: 174 mg/dL — ABNORMAL HIGH (ref 70–99)
Glucose-Capillary: 121 mg/dL — ABNORMAL HIGH (ref 70–99)
Glucose-Capillary: 128 mg/dL — ABNORMAL HIGH (ref 70–99)
Glucose-Capillary: 117 mg/dL — ABNORMAL HIGH (ref 70–99)

## 2021-08-20 LAB — TSH: TSH: 0.734 u[IU]/mL (ref 0.350–4.500)

## 2021-08-20 LAB — HEMOGLOBIN AND HEMATOCRIT, BLOOD
HCT: 20.1 % — ABNORMAL LOW (ref 36.0–46.0)
HCT: 27.8 % — ABNORMAL LOW (ref 36.0–46.0)
Hemoglobin: 6.3 g/dL — CL (ref 12.0–15.0)
Hemoglobin: 9.6 g/dL — ABNORMAL LOW (ref 12.0–15.0)

## 2021-08-20 LAB — HEPATITIS B SURFACE ANTIBODY,QUALITATIVE: Hep B S Ab: NONREACTIVE

## 2021-08-20 LAB — ABO/RH: ABO/RH(D): O POS

## 2021-08-20 LAB — MAGNESIUM: Magnesium: 2.1 mg/dL (ref 1.7–2.4)

## 2021-08-20 LAB — CALCIUM, IONIZED: Calcium, Ionized, Serum: <3 mg/dL — ABNORMAL LOW (ref 4.5–5.6)

## 2021-08-20 LAB — PREPARE RBC (CROSSMATCH)

## 2021-08-20 LAB — COPPER, SERUM: Copper: 63 ug/dL — ABNORMAL LOW (ref 80–158)

## 2021-08-20 LAB — AMMONIA: Ammonia: 30 umol/L (ref 9–35)

## 2021-08-20 LAB — CORTISOL-AM, BLOOD: Cortisol - AM: 30.9 ug/dL — ABNORMAL HIGH (ref 6.7–22.6)

## 2021-08-20 MED ORDER — IPRATROPIUM BROMIDE 0.02 % IN SOLN
RESPIRATORY_TRACT | Status: AC
  Administered 2021-08-20: 0.5 mg
  Filled 2021-08-20: qty 2.5

## 2021-08-20 MED ORDER — IPRATROPIUM-ALBUTEROL 0.5-2.5 (3) MG/3ML IN SOLN
3.0000 mL | Freq: Four times a day (QID) | RESPIRATORY_TRACT | Status: DC
  Administered 2021-08-20: 3 mL via RESPIRATORY_TRACT
  Filled 2021-08-20: qty 3

## 2021-08-20 MED ORDER — ALBUTEROL SULFATE (2.5 MG/3ML) 0.083% IN NEBU
INHALATION_SOLUTION | RESPIRATORY_TRACT | Status: AC
  Administered 2021-08-20: 2.5 mg
  Filled 2021-08-20: qty 3

## 2021-08-20 MED ORDER — LORAZEPAM 2 MG/ML IJ SOLN
INTRAMUSCULAR | Status: AC
  Filled 2021-08-20: qty 1

## 2021-08-20 MED ORDER — SODIUM CHLORIDE 0.9% IV SOLUTION: Freq: Once | INTRAVENOUS | Status: AC

## 2021-08-20 MED ORDER — INSULIN ASPART 100 UNIT/ML IV SOLN
10.0000 [IU] | Freq: Once | INTRAVENOUS | Status: AC
  Administered 2021-08-20: 10 [IU] via INTRAVENOUS

## 2021-08-20 MED ORDER — LIDOCAINE HCL (PF) 1 % IJ SOLN
INTRAMUSCULAR | Status: AC
  Filled 2021-08-20: qty 10

## 2021-08-20 MED ORDER — CALCIUM GLUCONATE-NACL 1-0.675 GM/50ML-% IV SOLN
1.0000 g | Freq: Once | INTRAVENOUS | Status: AC
  Administered 2021-08-20: 1000 mg via INTRAVENOUS
  Filled 2021-08-20: qty 50

## 2021-08-20 MED ORDER — SODIUM CHLORIDE 0.9 % IV SOLN: 100.0000 mL | INTRAVENOUS | Status: DC | PRN

## 2021-08-20 MED ORDER — LIDOCAINE HCL 1 % IJ SOLN
10.0000 mL | Freq: Once | INTRAMUSCULAR | Status: AC
  Administered 2021-08-20: 10 mL via INTRADERMAL

## 2021-08-20 MED ORDER — METOPROLOL TARTRATE 5 MG/5ML IV SOLN
5.0000 mg | Freq: Four times a day (QID) | INTRAVENOUS | Status: DC | PRN
  Administered 2021-08-23: 5 mg via INTRAVENOUS
  Filled 2021-08-20 (×2): qty 5

## 2021-08-20 MED ORDER — SODIUM ZIRCONIUM CYCLOSILICATE 10 G PO PACK
10.0000 g | PACK | Freq: Once | ORAL | Status: AC
  Administered 2021-08-20: 10 g via ORAL
  Filled 2021-08-20: qty 1

## 2021-08-20 MED ORDER — DEXTROSE 50 % IV SOLN
1.0000 | Freq: Once | INTRAVENOUS | Status: AC
  Administered 2021-08-20: 50 mL via INTRAVENOUS
  Filled 2021-08-20: qty 50

## 2021-08-20 MED ORDER — LORAZEPAM 2 MG/ML IJ SOLN
1.0000 mg | Freq: Once | INTRAMUSCULAR | Status: AC
  Administered 2021-08-20: 1 mg via INTRAVENOUS

## 2021-08-20 MED ORDER — IPRATROPIUM-ALBUTEROL 0.5-2.5 (3) MG/3ML IN SOLN
3.0000 mL | Freq: Four times a day (QID) | RESPIRATORY_TRACT | Status: DC
  Administered 2021-08-20 – 2021-08-23 (×10): 3 mL via RESPIRATORY_TRACT
  Filled 2021-08-20 (×12): qty 3

## 2021-08-20 MED ORDER — IPRATROPIUM-ALBUTEROL 0.5-2.5 (3) MG/3ML IN SOLN: 3.0000 mL | Freq: Four times a day (QID) | RESPIRATORY_TRACT | Status: DC | PRN

## 2021-08-20 MED ORDER — FUROSEMIDE 10 MG/ML IJ SOLN
20.0000 mg | Freq: Once | INTRAMUSCULAR | Status: AC
  Administered 2021-08-20: 20 mg via INTRAVENOUS
  Filled 2021-08-20: qty 2

## 2021-08-20 NOTE — Progress Notes (Signed)
[facility]  CVL in good position and no PTX on CXR. Ok to use.  Curlene Labrum, MD [facility] [address] [place], [place] [address] [phone] (office)

## 2021-08-20 NOTE — Progress Notes (Signed)
Increased confusion noted with this patient. She is able to tell me her name and birthday but thinks we are at a birthday party and is trying to get up out of bed. When redirected and told she is in bed she states that "no she is not in a bed" Patient continues to talk about random things that are not making any sense.  Patient has had more jerky movements even while sleeping.

## 2021-08-20 NOTE — Procedures (Signed)
   INITIAL HEMODIALYSIS TREATMENT NOTE:   First ever hemodialysis treatment completed via newly placed right IJ temporary catheter.  Cath tolerated prescribed flow of 300 cc/min with stable pressures.    3 hour session completed.  One unit pRBC transfused (had received another unit prior to this via PIV).  Goal met: net UF 2 liters.  All blood was returned.  No changes from pre-HD assessment.  Rockwell Alexandria, RN

## 2021-08-20 NOTE — Procedures (Signed)
Procedure Note  08/20/21    Preoperative Diagnosis: Acute on chronic renal insufficiency   Postoperative Diagnosis: Same   Procedure(s) Performed: Temporary dialysis central Line placement, right internal jugular    Surgeon: Lanell Matar. Constance Haw, MD   Assistants: None   Anesthesia: 1% lidocaine    Complications: None    Indications: Ms. Vogt is a 70 y.o. with acute on chronic renal failure who needs dialysis. The risk and benefits of placement of the central line with husband, including but not limited to bleeding, infection, and risk of pneumothorax. HE has given verbal telephone consent for the procedure.    Procedure: The patient placed supine. The right chest and neck was prepped and draped in the usual sterile fashion.  Wearing full gown and gloves, I performed the procedure.  One percent lidocaine was used for local anesthesia. An ultrasound was utilized to assess the jugular vein.  At fist I could not get the wire to thread, so I had to [place] with a new trajectory. The needle with syringe was advanced into the vein with dark venous return, and a wire was placed using the Seldinger technique without difficulty.  Ectopia was not noted.  The skin was knicked and a dilator was placed, and the three lumen dialysis catheter was placed over the wire with continued control of the wire.  There was good draw back of blood from all three lumens and each flushed easily with saline.  The catheter was secured in 3 points with 2-0 silk and a biopatch and dressing was placed.     The patient tolerated the procedure well, and the CXR was ordered to confirm position of the central line.   Curlene Labrum, MD [facility] [address] [place], [place] [address] [phone] (office)

## 2021-08-20 NOTE — Progress Notes (Addendum)
PROGRESS NOTE    TERISSA HAFFEY  LKG:[id number] DOB: 20-Jan-1951 DOA: 08/16/2021 PCP: [name], MD   Brief Narrative:   70 year old female with history of CKD stage IIIb, Crohn's disease and IBS with chronic diarrhea, hypertension, type 2 diabetes, BPPV who presented to the emergency room with blurry vision and diplopia for 2 days along with stabbing headache mostly on the back of her head.  She had urinary symptoms and was treated with doxycycline since last 7 days.  She also developed diffuse rash while taking it.  In the emergency room afebrile.  Heart rate normal.  Blood pressure systolic [phone].  Patient was given labetalol in the ER and blood pressure improved.  Neurology was consulted.  MRI of the brain and MRA/MRV was done and was essentially normal.  She was also found to be suffering with acute kidney injury on CKDIIIb.  She is noted to have some ongoing acidosis as well as hyperkalemia.  Now with some hypotension as well.  She is now having worsening AKI which will require urgent hemodialysis.  Acute anemia with need for 2 unit PRBC transfusion as well.  Assessment & Plan:   Principal Problem:   Blurry vision, bilateral Active Problems:   Essential hypertension   Depression   Acute kidney injury superimposed on CKD (HCC)   Crohn's disease of small intestine with other complication ([facility])   Hypomagnesemia   Acute hypokalemia   Acute hypoxemic respiratory failure-multifactorial with multifocal pneumonia versus acute diastolic CHF exacerbation -Volume management per hemodialysis -Monitor strict I's and O's and daily weights -Procalcitonin borderline, continue current IV antibiotics -2D echocardiogram pending -VQ scan with no indication for PE  Acute metabolic encephalopathy likely uremic -Ammonia and TSH within normal limits -Appreciate nephrology for hemodialysis -Continue close monitoring   AKI on CKD stage IIIb -Appreciate nephrology today with initiation of  urgent hemodialysis -Renal ultrasound with no significant findings -Repeat a.m. labs  Hyperkalemia secondary to above -EKG within normal limits -Given D50 and insulin this a.m. as well as calcium gluconate -Lokelma given -Hemodialysis -Recheck labs in a.m.  Acute on chronic anemia -Likely related to CKD -2 unit PRBC transfusion today -Monitor for overt bleeding -Recheck CBC in a.m. -Stool occult   Hypertensive crisis, now with hypotension -Started on midodrine today to attempt to avoid vasopressor use -Multiple blood pressure medications to include hydralazine and clonidine discontinued -Metoprolol to continue with holding parameters   Acute diplopia and headaches-resolved -Unclear etiology and assessed by neurology -Likely was related to severe hypomagnesemia which has not been corrected -Continue to monitor repeat magnesium levels -Muscle relaxers as needed   Non-anion gap metabolic acidosis -Related to diarrhea and possibly AKI -Now will be initiated on hemodialysis -Continue to monitor repeat lab   Type 2 diabetes-controlled -Keep on SSI   Crohn's disease with chronic diarrhea -Humira to resume outpatient -Continue Bentyl   Skin rash -Primary cause is unknown, but started after doxycycline and Diflucan likely drug reaction -Continue to monitor  DVT prophylaxis: Heparin Code Status: Full Family Communication: Discussed with husband on phone 10/22 Disposition Plan:  Status is: Inpatient   Remains inpatient appropriate because: Patient is currently unstable with hypotension and requires ICU level monitoring as well as IV medications.     Consultants:  Nephrology Neurology   Procedures:  Right IJ hemodialysis catheter placement 10/23  Antimicrobials:  Anti-infectives (From admission, onward)    Start     Dose/Rate Route Frequency Ordered Stop   08/19/21 0745  ceFEPIme (MAXIPIME) 2 g  in sodium chloride 0.9 % 100 mL IVPB        2 g 200 mL/hr over 30  Minutes Intravenous Every 24 hours 08/19/21 0651     08/19/21 0745  vancomycin (VANCOREADY) IVPB 750 mg/150 mL        750 mg 150 mL/hr over 60 Minutes Intravenous Every 48 hours 08/19/21 0651     08/18/21 0945  fluconazole (DIFLUCAN) tablet 150 mg        150 mg Oral  Once 08/18/21 0853 08/18/21 0951      Subjective: Patient seen and evaluated today with worsening confusion noted this morning.  Poor urine output noted overnight.  She is stating she thinks that the roof is falling on her.  Objective: Vitals:   08/20/21 1330 08/20/21 1345 08/20/21 1400 08/20/21 1412  BP: (!) 144/82 (!) 161/58 (!) 158/59 (!) 166/69  Pulse: 87 86 84 85  Resp: 17 15 15 16   Temp:    98.2 F (36.8 C)  TempSrc:    Axillary  SpO2:    96%  Weight:      Height:        Intake/Output Summary (Last 24 hours) at 08/20/2021 1421 Last data filed at 08/20/2021 1412 Gross per 24 hour  Intake 1251.14 ml  Output 931 ml  Net 320.14 ml   Filed Weights   08/19/21 0600 08/20/21 0503 08/20/21 1250  Weight: 77.9 kg 75.7 kg 75.7 kg    Examination:  General exam: Appears confused Respiratory system: Clear to auscultation. Respiratory effort normal.  Currently on nasal cannula oxygen. Cardiovascular system: S1 & S2 heard, RRR.  Gastrointestinal system: Abdomen is soft Central nervous system: Alert and awake Extremities: No edema Skin: No significant lesions noted Psychiatry: Flat affect.    Data Reviewed: I have personally reviewed following labs and imaging studies  CBC: Recent Labs  Lab 08/16/21 1206 08/17/21 0542 08/18/21 0622 08/19/21 0620 08/20/21 0400 08/20/21 0512  WBC 9.4 9.0 5.8 4.8 12.2*  --   NEUTROABS 6.6  --  3.1  --   --   --   HGB 9.7* 9.1* 7.6* 8.6* 6.8* 6.3*  HCT 30.4* 29.5* 23.8* 27.2* 21.2* 20.1*  MCV 95.9 100.3* 100.8* 100.4* 100.5*  --   PLT 216 187 134* 192 115*  --    Basic Metabolic Panel: Recent Labs  Lab 08/16/21 1206 08/17/21 0542 08/18/21 0622 08/19/21 0504  08/19/21 0511 08/20/21 0400  NA 139 137 135 136 135 134*  K 3.1* 4.9 5.5* 5.3* 5.3* 6.2*  CL 106 109 107 105 105 106  CO2 21* 17* 18* 18* 19* 14*  GLUCOSE 43* 221* 160* 133* 133* 146*  BUN 56* 58* 73* 78* 79* 89*  CREATININE 3.23* 3.21* 3.86* 3.93* 3.88* 4.63*  CALCIUM 6.5* 6.5* 6.4* 7.6* 7.6* 7.2*  MG 0.7* 2.1 1.6*  --  1.7 2.1  PHOS  --   --  5.7* 7.0*  --   --    GFR: Estimated Creatinine Clearance: 11.4 mL/min (A) (by C-G formula based on SCr of 4.63 mg/dL (H)). Liver Function Tests: Recent Labs  Lab 08/16/21 1206 08/19/21 0504 08/19/21 0511  AST 21  --  22  ALT 17  --  15  ALKPHOS 57  --  69  BILITOT 1.0  --  0.6  PROT 6.9  --  6.1*  ALBUMIN 3.4* 3.1* 3.0*   No results for input(s): LIPASE, AMYLASE in the last 168 hours. Recent Labs  Lab 08/20/21 0803  AMMONIA 30  Coagulation Profile: Recent Labs  Lab 08/20/21 1027  INR 1.6*   Cardiac Enzymes: No results for input(s): CKTOTAL, CKMB, CKMBINDEX, TROPONINI in the last 168 hours. BNP (last 3 results) No results for input(s): PROBNP in the last 8760 hours. HbA1C: No results for input(s): HGBA1C in the last 72 hours. CBG: Recent Labs  Lab 08/19/21 1721 08/19/21 2110 08/20/21 0758 08/20/21 0851 08/20/21 1107  GLUCAP 149* 161* 155* 174* 121*   Lipid Profile: No results for input(s): CHOL, HDL, LDLCALC, TRIG, CHOLHDL, LDLDIRECT in the last 72 hours. Thyroid Function Tests: Recent Labs    08/20/21 0803  TSH 0.734   Anemia Panel: No results for input(s): VITAMINB12, FOLATE, FERRITIN, TIBC, IRON, RETICCTPCT in the last 72 hours. Sepsis Labs: Recent Labs  Lab 08/19/21 0620  PROCALCITON 0.31    Recent Results (from the past 240 hour(s))  Resp Panel by RT-PCR (Flu A&B, Covid) Nasopharyngeal Swab     Status: None   Collection Time: 08/16/21  8:28 PM   Specimen: Nasopharyngeal Swab; Nasopharyngeal(NP) swabs in vial transport medium  Result Value Ref Range Status   SARS Coronavirus 2 by RT PCR  NEGATIVE NEGATIVE Final    Comment: (NOTE) SARS-CoV-2 target nucleic acids are NOT DETECTED.  The SARS-CoV-2 RNA is generally detectable in upper respiratory specimens during the acute phase of infection. The lowest concentration of SARS-CoV-2 viral copies this assay can detect is 138 copies/mL. A negative result does not preclude SARS-Cov-2 infection and should not be used as the sole basis for treatment or other patient management decisions. A negative result may occur with  improper specimen collection/handling, submission of specimen other than nasopharyngeal swab, presence of viral mutation(s) within the areas targeted by this assay, and inadequate number of viral copies(<138 copies/mL). A negative result must be combined with clinical observations, patient history, and epidemiological information. The expected result is Negative.  Fact Sheet for Patients:  EntrepreneurPulse.com.au  Fact Sheet for Healthcare Providers:  IncredibleEmployment.be  This test is no t yet approved or cleared by the [place] FDA and  has been authorized for detection and/or diagnosis of SARS-CoV-2 by FDA under an Emergency Use Authorization (EUA). This EUA will remain  in effect (meaning this test can be used) for the duration of the COVID-19 declaration under Section 564(b)(1) of the Act, 21 U.S.C.section 360bbb-3(b)(1), unless the authorization is terminated  or revoked sooner.       Influenza A by PCR NEGATIVE NEGATIVE Final   Influenza B by PCR NEGATIVE NEGATIVE Final    Comment: (NOTE) The Xpert Xpress SARS-CoV-2/FLU/RSV plus assay is intended as an aid in the diagnosis of influenza from Nasopharyngeal swab specimens and should not be used as a sole basis for treatment. Nasal washings and aspirates are unacceptable for Xpert Xpress SARS-CoV-2/FLU/RSV testing.  Fact Sheet for Patients: EntrepreneurPulse.com.au  Fact Sheet for  Healthcare Providers: IncredibleEmployment.be  This test is not yet approved or cleared by the [place] FDA and has been authorized for detection and/or diagnosis of SARS-CoV-2 by FDA under an Emergency Use Authorization (EUA). This EUA will remain in effect (meaning this test can be used) for the duration of the COVID-19 declaration under Section 564(b)(1) of the Act, 21 U.S.C. section 360bbb-3(b)(1), unless the authorization is terminated or revoked.  Performed at [facility], [address] [place], [place] [address]   Urine Culture     Status: None   Collection Time: 08/17/21 11:00 AM   Specimen: Urine, Clean Catch  Result Value Ref Range Status  Specimen Description   Final    URINE, CLEAN CATCH Performed at [facility], [address], [place], [place] [address]    Special Requests   Final    NONE Performed at [facility], [address] [place], [place] [address]    Culture   Final    NO GROWTH Performed at [facility], [place] [address] [place], [place] [address]    Report Status 08/19/2021 FINAL  Final  Culture, blood (Routine X 2) w Reflex to ID Panel     Status: None (Preliminary result)   Collection Time: 08/19/21  7:28 AM   Specimen: BLOOD LEFT ARM  Result Value Ref Range Status   Specimen Description BLOOD LEFT ARM BOTTLES DRAWN AEROBIC AND ANAEROBIC  Final   Special Requests   Final    Blood Culture results may not be optimal due to an excessive volume of blood received in culture bottles   Culture   Final    NO GROWTH 1 DAY Performed at [facility], [address], [place], [place] [address]    Report Status PENDING  Incomplete  Culture, blood (Routine X 2) w Reflex to ID Panel     Status: None (Preliminary result)   Collection Time: 08/19/21  8:27 AM   Specimen: BLOOD LEFT HAND  Result Value Ref Range Status   Specimen Description   Final    BLOOD LEFT HAND BOTTLES DRAWN AEROBIC AND ANAEROBIC   Special Requests Blood  Culture adequate volume  Final   Culture   Final    NO GROWTH < 24 HOURS Performed at [facility], [address], [place], [place] [address]    Report Status PENDING  Incomplete         Radiology Studies: NM Pulmonary Perfusion  Result Date: 08/19/2021 CLINICAL DATA:  Shortness of breath and chest pain. Positive D-dimer. EXAM: NUCLEAR MEDICINE PERFUSION LUNG SCAN TECHNIQUE: Perfusion images were obtained in multiple projections after intravenous injection of radiopharmaceutical. Ventilation scans intentionally deferred if perfusion scan and chest x-ray adequate for interpretation during COVID 19 epidemic. RADIOPHARMACEUTICALS:  4.4 mCi Tc-25m MAA IV COMPARISON:  Chest radiography same day. FINDINGS: No clear segmental or subsegmental defect. Mild diffuse patchy pattern consistent with the abnormal chest radiography showing widespread bilateral airspace filling. Consider CT scan of the chest (without contrast if the patient cannot tolerate contrast administration) for better evaluation of parenchymal disease. IMPRESSION: No arterial filling defect to suggest embolic disease. Mild patchy pattern consistent with a widespread airspace disease shown by radiography earlier today. Electronically Signed   By: Nelson Chimes M.D.   On: 08/19/2021 13:40   DG Chest Port 1 View  Result Date: 08/20/2021 CLINICAL DATA:  Status post dialysis catheter placement. EXAM: PORTABLE CHEST 1 VIEW COMPARISON:  08/19/2021 FINDINGS: Right IJ catheter has been placed with tip at the cavoatrial junction. No pneumothorax visualized. Unchanged cardiac enlargement. Persistent bilateral nodular and confluent airspace opacities involving the upper and lower lung zones. The appearance is not significantly changed compared with previous exam. No signs of pleural effusion. IMPRESSION: 1. Interval placement of right IJ catheter with tip at the cavoatrial junction. 2. No change in aeration to the lungs compared with previous exam.  Electronically Signed   By: Kerby Moors M.D.   On: 08/20/2021 12:57   DG Chest Port 1 View  Result Date: 08/19/2021 CLINICAL DATA:  New onset shortness of breath. History of hypertension, diabetes and pneumonia. Negative COVID-19 test 3 days ago. EXAM: PORTABLE CHEST  1 VIEW COMPARISON:  02/27/2021 FINDINGS: Multifocal bilateral areas of airspace disease identified involving both upper and lower lung zones. Some of these opacities have a nodular configuration. New bilateral hilar prominence is identified which may reflect underlying adenopathy. Trace pulmonary edema. No signs of pleural effusion. IMPRESSION: 1. Multifocal bilateral airspace disease. Some of these opacities have a nodular configuration. In the acute setting findings are consistent with multifocal pneumonia. Given the nodular configuration of some of these opacities metastatic disease would be difficult to exclude. Consider more definitive characterization with CT of the chest with contrast material. 2. New bilateral hilar prominence which may reflect underlying adenopathy. Electronically Signed   By: Kerby Moors M.D.   On: 08/19/2021 06:22        Scheduled Meds:  allopurinol  100 mg Oral Daily   atorvastatin  80 mg Oral QHS   Chlorhexidine Gluconate Cloth  6 each Topical Daily   citalopram  20 mg Oral Daily   dextromethorphan-guaiFENesin  1 tablet Oral BID   dicyclomine  20 mg Oral TID AC   gabapentin  300 mg Oral BID   heparin injection (subcutaneous)  5,000 Units Subcutaneous Q8H   insulin aspart  0-9 Units Subcutaneous TID WC   insulin detemir  15 Units Subcutaneous QHS   ipratropium-albuterol  3 mL Nebulization Q6H   lidocaine (PF)       lipase/protease/amylase  36,000-72,000 Units Oral See admin instructions   LORazepam       metoprolol tartrate  100 mg Oral BID   midodrine  10 mg Oral TID WC   pantoprazole  40 mg Oral Daily   sodium bicarbonate  650 mg Oral TID   venlafaxine XR  150 mg Oral Daily    Continuous Infusions:  sodium chloride     sodium chloride     ceFEPime (MAXIPIME) IV 2 g (08/20/21 0647)   vancomycin Stopped (08/19/21 0813)     LOS: 3 days    Total critical care time: 45 minutes.    Piotr Christopher Darleen Crocker, DO [facility]  If 7PM-7AM, please contact night-coverage [email or web address] 08/20/2021, 2:21 PM

## 2021-08-20 NOTE — Progress Notes (Signed)
MD made aware that patient's HR is elevated (low 100's) with frequent PVC's noted. Metoprolol PRN ordered.

## 2021-08-20 NOTE — Progress Notes (Signed)
Admit: 08/16/2021 LOS: 3  34F AoCKD3, nephrotic proteinuria, with AMS, acidosis, and hyperkalemia  Subjective:  UOP decreased overnight K up to 6.2, HCO3 14 SCR up to 4.6 Ordered Ca Gluc, Ins/Dext, Lokelma  Hb 6.3, pRBC ordered GN w/u neg or pending (neg HBV, HCV, C3, C4; pending ANCA GBM ANA); UA had no hematuria Renal US no acute structural issues  10/22 0701 - 10/23 0700 In: 809.6 [I.V.:574.1; IV Piggyback:235.5] Out: 856 [Urine:856]  Filed Weights   08/16/21 1058 08/19/21 0600 08/20/21 0503  Weight: 68 kg 77.9 kg 75.7 kg    Scheduled Meds:  sodium chloride   Intravenous Once   allopurinol  100 mg Oral Daily   atorvastatin  80 mg Oral QHS   Chlorhexidine Gluconate Cloth  6 each Topical Daily   citalopram  20 mg Oral Daily   dextromethorphan-guaiFENesin  1 tablet Oral BID   dicyclomine  20 mg Oral TID AC   furosemide  20 mg Intravenous Once   gabapentin  300 mg Oral BID   heparin injection (subcutaneous)  5,000 Units Subcutaneous Q8H   insulin aspart  0-9 Units Subcutaneous TID WC   insulin detemir  15 Units Subcutaneous QHS   ipratropium-albuterol  3 mL Nebulization Q6H   lipase/protease/amylase  36,000-72,000 Units Oral See admin instructions   metoprolol tartrate  100 mg Oral BID   midodrine  10 mg Oral TID WC   pantoprazole  40 mg Oral Daily   sodium bicarbonate  650 mg Oral TID   venlafaxine XR  150 mg Oral Daily   Continuous Infusions:  calcium gluconate 1,000 mg (08/20/21 0803)   ceFEPime (MAXIPIME) IV 2 g (08/20/21 0647)   vancomycin Stopped (08/19/21 0813)   PRN Meds:.diphenhydrAMINE, diphenoxylate-atropine, HYDROmorphone (DILAUDID) injection, HYDROmorphone, meclizine, methocarbamol, ondansetron **OR** ondansetron (ZOFRAN) IV, temazepam  Current Labs: reviewed    Physical Exam:  Blood pressure 105/81, pulse 84, temperature 99.8 F (37.7 C), temperature source Axillary, resp. rate 18, height 5\' 4"  (1.626 m), weight 75.7 kg, SpO2 93  %. Confused/agitated NCAT Regular, nl s1s2 COarse bs b/l, some exp wheezing S/nt/nd No LEE Nonfocal NO rashes noted  A Progressive AoCKD3 now c/b by hyperkalemia, acidosis, hypoxia, AMS; baseline nephrotic CKD prob DKD, serologies in process; likely now ATN, doubt AIN; no Hb on UA; [place] at admit no acute issues AMS, multifactorial including uremic Hyperkalemia: s/p shifting and Lokelma by TRH earlier today, EKG ok Metabolic Acidosis 2/2 #1 AHRF, 5L [place], prob AoC HFpE HTN, with big shifts at admit, stable now DM2 Anemia, for pRBC today  P HD today: 3h: 2L UF, 2K, 300/500, no heparin, temp HD cath Appreciate Dr. Constance Haw help with HD cath Medication Issues; Preferred narcotic agents for pain control are hydromorphone, fentanyl, and methadone. Morphine should not be used.  Baclofen should be avoided Avoid oral sodium phosphate and magnesium citrate based laxatives / bowel preps    Pearson Grippe MD 08/20/2021, 8:24 AM  Recent Labs  Lab 08/18/21 0622 08/19/21 0504 08/19/21 0511 08/20/21 0400  NA 135 136 135 134*  K 5.5* 5.3* 5.3* 6.2*  CL 107 105 105 106  CO2 18* 18* 19* 14*  GLUCOSE 160* 133* 133* 146*  BUN 73* 78* 79* 89*  CREATININE 3.86* 3.93* 3.88* 4.63*  CALCIUM 6.4* 7.6* 7.6* 7.2*  PHOS 5.7* 7.0*  --   --    Recent Labs  Lab 08/16/21 1206 08/17/21 0542 08/18/21 0622 08/19/21 0620 08/20/21 0400 08/20/21 0512  WBC 9.4   < > 5.8  4.8 12.2*  --   NEUTROABS 6.6  --  3.1  --   --   --   HGB 9.7*   < > 7.6* 8.6* 6.8* 6.3*  HCT 30.4*   < > 23.8* 27.2* 21.2* 20.1*  MCV 95.9   < > 100.8* 100.4* 100.5*  --   PLT 216   < > 134* 192 115*  --    < > = values in this interval not displayed.

## 2021-08-21 ENCOUNTER — Inpatient Hospital Stay (HOSPITAL_COMMUNITY): Payer: 59

## 2021-08-21 DIAGNOSIS — H538 Other visual disturbances: Secondary | ICD-10-CM | POA: Diagnosis not present

## 2021-08-21 DIAGNOSIS — I5031 Acute diastolic (congestive) heart failure: Secondary | ICD-10-CM | POA: Diagnosis not present

## 2021-08-21 LAB — KAPPA/LAMBDA LIGHT CHAINS
Kappa free light chain: 164.3 mg/L — ABNORMAL HIGH (ref 3.3–19.4)
Kappa, lambda light chain ratio: 3.27 — ABNORMAL HIGH (ref 0.26–1.65)
Lambda free light chains: 50.3 mg/L — ABNORMAL HIGH (ref 5.7–26.3)

## 2021-08-21 LAB — TYPE AND SCREEN
ABO/RH(D): O POS
Antibody Screen: NEGATIVE
Unit division: 0
Unit division: 0

## 2021-08-21 LAB — PROTEIN / CREATININE RATIO, URINE
Creatinine, Urine: 112.76 mg/dL
Protein Creatinine Ratio: 2.29 mg/mg{Cre} — ABNORMAL HIGH (ref 0.00–0.15)
Total Protein, Urine: 258 mg/dL

## 2021-08-21 LAB — CBC
HCT: 28.7 % — ABNORMAL LOW (ref 36.0–46.0)
Hemoglobin: 9.5 g/dL — ABNORMAL LOW (ref 12.0–15.0)
MCH: 30.5 pg (ref 26.0–34.0)
MCHC: 33.1 g/dL (ref 30.0–36.0)
MCV: 92.3 fL (ref 80.0–100.0)
Platelets: 102 10*3/uL — ABNORMAL LOW (ref 150–400)
RBC: 3.11 MIL/uL — ABNORMAL LOW (ref 3.87–5.11)
RDW: 16.2 % — ABNORMAL HIGH (ref 11.5–15.5)
WBC: 11.2 10*3/uL — ABNORMAL HIGH (ref 4.0–10.5)
nRBC: 0 % (ref 0.0–0.2)

## 2021-08-21 LAB — GLOMERULAR BASEMENT MEMBRANE ANTIBODIES: GBM Ab: <0.2 units (ref 0.0–0.9)

## 2021-08-21 LAB — BASIC METABOLIC PANEL
Anion gap: 14 (ref 5–15)
BUN: 47 mg/dL — ABNORMAL HIGH (ref 8–23)
CO2: 21 mmol/L — ABNORMAL LOW (ref 22–32)
Calcium: 8.3 mg/dL — ABNORMAL LOW (ref 8.9–10.3)
Chloride: 100 mmol/L (ref 98–111)
Creatinine, Ser: 3.29 mg/dL — ABNORMAL HIGH (ref 0.44–1.00)
GFR, Estimated: 15 mL/min — ABNORMAL LOW (ref >60)
Glucose, Bld: 126 mg/dL — ABNORMAL HIGH (ref 70–99)
Potassium: 4 mmol/L (ref 3.5–5.1)
Sodium: 135 mmol/L (ref 135–145)

## 2021-08-21 LAB — ECHOCARDIOGRAM COMPLETE
AR max vel: 2.41 cm2
AV Area VTI: 2.83 cm2
AV Area mean vel: 2.72 cm2
AV Mean grad: 6 mmHg
AV Peak grad: 10.8 mmHg
Ao pk vel: 1.64 m/s
Area-P 1/2: 5.2 cm2
Height: 64 in
MV VTI: 3.21 cm2
S' Lateral: 2.9 cm
Weight: 2670.21 oz

## 2021-08-21 LAB — BLOOD GAS, ARTERIAL
Acid-base deficit: 3.3 mmol/L — ABNORMAL HIGH (ref 0.0–2.0)
Bicarbonate: 21.4 mmol/L (ref 20.0–28.0)
FIO2: 80
O2 Saturation: 84.9 %
Patient temperature: 37.5
pCO2 arterial: 40.8 mmHg (ref 32.0–48.0)
pH, Arterial: 7.343 — ABNORMAL LOW (ref 7.350–7.450)
pO2, Arterial: 56.8 mmHg — ABNORMAL LOW (ref 83.0–108.0)

## 2021-08-21 LAB — STREP PNEUMONIAE URINARY ANTIGEN: Strep Pneumo Urinary Antigen: NEGATIVE

## 2021-08-21 LAB — BPAM RBC
Blood Product Expiration Date: 202211272359
Blood Product Expiration Date: 202211272359
ISSUE DATE / TIME: 202210231100
ISSUE DATE / TIME: 202210231416
Unit Type and Rh: 5100
Unit Type and Rh: 5100

## 2021-08-21 LAB — GLUCOSE, CAPILLARY
Glucose-Capillary: 146 mg/dL — ABNORMAL HIGH (ref 70–99)
Glucose-Capillary: 204 mg/dL — ABNORMAL HIGH (ref 70–99)
Glucose-Capillary: 208 mg/dL — ABNORMAL HIGH (ref 70–99)
Glucose-Capillary: 175 mg/dL — ABNORMAL HIGH (ref 70–99)

## 2021-08-21 LAB — MAGNESIUM: Magnesium: 1.9 mg/dL (ref 1.7–2.4)

## 2021-08-21 LAB — MRSA NEXT GEN BY PCR, NASAL: MRSA by PCR Next Gen: DETECTED — AB

## 2021-08-21 MED ORDER — MIDODRINE HCL 5 MG PO TABS
5.0000 mg | ORAL_TABLET | Freq: Three times a day (TID) | ORAL | Status: DC
  Administered 2021-08-21: 5 mg via ORAL
  Filled 2021-08-21: qty 1

## 2021-08-21 MED ORDER — FUROSEMIDE 10 MG/ML IJ SOLN
4.0000 mg/h | INTRAVENOUS | Status: DC
  Administered 2021-08-21 – 2021-08-25 (×3): 4 mg/h via INTRAVENOUS
  Filled 2021-08-21 (×4): qty 20

## 2021-08-21 MED ORDER — HALOPERIDOL LACTATE 5 MG/ML IJ SOLN
2.0000 mg | Freq: Four times a day (QID) | INTRAMUSCULAR | Status: DC | PRN
  Administered 2021-08-21 – 2021-08-27 (×8): 2 mg via INTRAVENOUS
  Filled 2021-08-21 (×8): qty 1

## 2021-08-21 MED ORDER — FUROSEMIDE 10 MG/ML IJ SOLN
80.0000 mg | Freq: Two times a day (BID) | INTRAMUSCULAR | Status: DC
  Filled 2021-08-21: qty 8

## 2021-08-21 NOTE — Progress Notes (Signed)
*  PRELIMINARY RESULTS* Echocardiogram 2D Echocardiogram has been performed.  Elpidio Anis 08/21/2021, 10:34 AM

## 2021-08-21 NOTE — Progress Notes (Addendum)
Patient able to state name, birth date and able to state where she is right now. Patient yells out things at times but denies being in pain currently.  Respiratory had placed patient on non-rebreather this afternoon for a couple of hours and sats were 98-100%. Non rebreather taken off at around 1610 and patient is on 15 Liters [place] via nasal cannula with O2 sats remaining between 90-95%. Dr Manuella Ghazi aware. Patient currently has no complaints of pain, shortness of breath or discomfort. New orders placed for lasix. Dr Manuella Ghazi made aware of SBP in the 90's. New order for Midodrine and Lasix drip. AC called to bring lasix drip.

## 2021-08-21 NOTE — Progress Notes (Signed)
Patient has became restless, trying to get out the bed, confused and yelling out. Attempted to reorient patient and verbally calm with little effect. Patient having difficulty sitting still to get a good pulse ox reading but noted to be in the 80's so bumped up O2 to 15 liters with O2 sat 88-89%. Dr Manuella Ghazi made aware and new orders placed and PRN haldol given per orders and respiratory notified of ABG order.

## 2021-08-21 NOTE — Progress Notes (Signed)
Patient ID: Heather [name], female   DOB: [birth year], 70 y.o.   MRN: [id number] S:Awake and alert but confused this morning.  Tolerated HD well yesterday with UF of 2 liters.  UOP has decreased overnight but improved K.  O:BP 132/90   Pulse (!) 106   Temp 98.3 F (36.8 C) (Oral)   Resp 20   Ht 5\' 4"  (1.626 m)   Wt 75.7 kg   LMP  (LMP Unknown)   SpO2 91%   BMI 28.65 kg/m   Intake/Output Summary (Last 24 hours) at 08/21/2021 0851 Last data filed at 08/21/2021 0723 Gross per 24 hour  Intake 856.5 ml  Output 2430 ml  Net -1573.5 ml   Intake/Output: I/O last 3 completed shifts: In: 1430.6 [I.V.:574.1; Blood:756.5; IV Piggyback:100] Out: 2671 [Urine:750; Other:2005]  Intake/Output this shift:  No intake/output data recorded. Weight change: 0 kg Gen:NAD CVS: Tachy at 106 Resp: crackles at left base, otherwise CTA Abd:+BS, soft, NT/ND Ext: no edema  Recent Labs  Lab 08/16/21 1206 08/17/21 0542 08/18/21 0622 08/19/21 0504 08/19/21 0511 08/20/21 0400 08/20/21 1900 08/21/21 0445  NA 139 137 135 136 135 134* 134* 135  K 3.1* 4.9 5.5* 5.3* 5.3* 6.2* 3.4* 4.0  CL 106 109 107 105 105 106 99 100  CO2 21* 17* 18* 18* 19* 14* 23 21*  GLUCOSE 43* 221* 160* 133* 133* 146* 106* 126*  BUN 56* 58* 73* 78* 79* 89* 40* 47*  CREATININE 3.23* 3.21* 3.86* 3.93* 3.88* 4.63* 2.88* 3.29*  ALBUMIN 3.4*  --   --  3.1* 3.0*  --   --   --   CALCIUM 6.5* 6.5* 6.4* 7.6* 7.6* 7.2* 8.0* 8.3*  PHOS  --   --  5.7* 7.0*  --   --   --   --   AST 21  --   --   --  22  --   --   --   ALT 17  --   --   --  15  --   --   --    Liver Function Tests: Recent Labs  Lab 08/16/21 1206 08/19/21 0504 08/19/21 0511  AST 21  --  22  ALT 17  --  15  ALKPHOS 57  --  69  BILITOT 1.0  --  0.6  PROT 6.9  --  6.1*  ALBUMIN 3.4* 3.1* 3.0*   No results for input(s): LIPASE, AMYLASE in the last 168 hours. Recent Labs  Lab 08/20/21 0803  AMMONIA 30   CBC: Recent Labs  Lab 08/16/21 1206 08/17/21 0542  08/18/21 0622 08/19/21 0620 08/20/21 0400 08/20/21 0512 08/20/21 1900 08/21/21 0445  WBC 9.4 9.0 5.8 4.8 12.2*  --   --  11.2*  NEUTROABS 6.6  --  3.1  --   --   --   --   --   HGB 9.7* 9.1* 7.6* 8.6* 6.8* 6.3* 9.6* 9.5*  HCT 30.4* 29.5* 23.8* 27.2* 21.2* 20.1* 27.8* 28.7*  MCV 95.9 100.3* 100.8* 100.4* 100.5*  --   --  92.3  PLT 216 187 134* 192 115*  --   --  102*   Cardiac Enzymes: No results for input(s): CKTOTAL, CKMB, CKMBINDEX, TROPONINI in the last 168 hours. CBG: Recent Labs  Lab 08/20/21 0851 08/20/21 1107 08/20/21 1713 08/20/21 2212 08/21/21 0818  GLUCAP 174* 121* 128* 117* 146*    Iron Studies: No results for input(s): IRON, TIBC, TRANSFERRIN, FERRITIN in the last 72 hours. Studies/Results: NM  Pulmonary Perfusion  Result Date: 08/19/2021 CLINICAL DATA:  Shortness of breath and chest pain. Positive D-dimer. EXAM: NUCLEAR MEDICINE PERFUSION LUNG SCAN TECHNIQUE: Perfusion images were obtained in multiple projections after intravenous injection of radiopharmaceutical. Ventilation scans intentionally deferred if perfusion scan and chest x-ray adequate for interpretation during COVID 19 epidemic. RADIOPHARMACEUTICALS:  4.4 mCi Tc-67m MAA IV COMPARISON:  Chest radiography same day. FINDINGS: No clear segmental or subsegmental defect. Mild diffuse patchy pattern consistent with the abnormal chest radiography showing widespread bilateral airspace filling. Consider CT scan of the chest (without contrast if the patient cannot tolerate contrast administration) for better evaluation of parenchymal disease. IMPRESSION: No arterial filling defect to suggest embolic disease. Mild patchy pattern consistent with a widespread airspace disease shown by radiography earlier today. Electronically Signed   By: Nelson Chimes M.D.   On: 08/19/2021 13:40   DG Chest Port 1 View  Result Date: 08/20/2021 CLINICAL DATA:  Status post dialysis catheter placement. EXAM: PORTABLE CHEST 1 VIEW  COMPARISON:  08/19/2021 FINDINGS: Right IJ catheter has been placed with tip at the cavoatrial junction. No pneumothorax visualized. Unchanged cardiac enlargement. Persistent bilateral nodular and confluent airspace opacities involving the upper and lower lung zones. The appearance is not significantly changed compared with previous exam. No signs of pleural effusion. IMPRESSION: 1. Interval placement of right IJ catheter with tip at the cavoatrial junction. 2. No change in aeration to the lungs compared with previous exam. Electronically Signed   By: Kerby Moors M.D.   On: 08/20/2021 12:57    allopurinol  100 mg Oral Daily   atorvastatin  80 mg Oral QHS   Chlorhexidine Gluconate Cloth  6 each Topical Daily   citalopram  20 mg Oral Daily   dextromethorphan-guaiFENesin  1 tablet Oral BID   dicyclomine  20 mg Oral TID AC   gabapentin  300 mg Oral BID   heparin injection (subcutaneous)  5,000 Units Subcutaneous Q8H   insulin aspart  0-9 Units Subcutaneous TID WC   insulin detemir  15 Units Subcutaneous QHS   ipratropium-albuterol  3 mL Nebulization Q6H   lipase/protease/amylase  36,000-72,000 Units Oral See admin instructions   metoprolol tartrate  100 mg Oral BID   pantoprazole  40 mg Oral Daily   sodium bicarbonate  650 mg Oral TID   venlafaxine XR  150 mg Oral Daily    BMET    Component Value Date/Time   NA 135 08/21/2021 0445   NA 143 06/17/2017 1220   K 4.0 08/21/2021 0445   CL 100 08/21/2021 0445   CO2 21 (L) 08/21/2021 0445   GLUCOSE 126 (H) 08/21/2021 0445   BUN 47 (H) 08/21/2021 0445   BUN 34 (H) 06/17/2017 1220   CREATININE 3.29 (H) 08/21/2021 0445   CALCIUM 8.3 (L) 08/21/2021 0445   CALCIUM 9.3 08/02/2021 1033   GFRNONAA 15 (L) 08/21/2021 0445   GFRAA 56 (L) 05/19/2020 0640   CBC    Component Value Date/Time   WBC 11.2 (H) 08/21/2021 0445   RBC 3.11 (L) 08/21/2021 0445   HGB 9.5 (L) 08/21/2021 0445   HCT 28.7 (L) 08/21/2021 0445   PLT 102 (L) 08/21/2021 0445    MCV 92.3 08/21/2021 0445   MCH 30.5 08/21/2021 0445   MCHC 33.1 08/21/2021 0445   RDW 16.2 (H) 08/21/2021 0445   LYMPHSABS 2.0 08/18/2021 0622   MONOABS 0.5 08/18/2021 0622   EOSABS 0.1 08/18/2021 0622   BASOSABS 0.0 08/18/2021 0622     Assessment/Plan:  AKI/CKD stage IIIb - in setting of PNA and hypertensive urgency and chronic diarrhea.  Baseline Scr 1.6-2.2 and follows Dr. Theador Hawthorne as an outpatient. Required HD yesterday due to hyperkalemia with UF of 2 liters.  UOP decreased somewhat overnight.  K stable.  Hold off on HD for now and continue to follow for renal recovery.   Serologies sent and pending (ANA, ANCA) as well as SPEP and light chains. Negative HIV, Hep B, and Hep C, normal complements. Acute hypoxic respiratory failure - due to multifocal pneumonia vs CHF.  Currently on Abx per primary HTN urgency/emergency - improved BP control and now with hypotension.   AMS - is awake and alert but remains confused and doesn't remember dialysis yesterday. Hypomagnesemia - likely due to chronic diarrhea. Hyperkalemia - due to AKI, improved with HD 08/20/21. Anemia of critical illness - s/p transfusion.  Continue to follow H/H.   Donetta Potts, MD [employer] [phone]

## 2021-08-21 NOTE — Progress Notes (Signed)
PROGRESS NOTE    Heather [name]  VEL:[id number] DOB: 08/27/1951 DOA: 08/16/2021 PCP: [name], MD   Brief Narrative:   70 year old female with history of CKD stage IIIb, Crohn's disease and IBS with chronic diarrhea, hypertension, type 2 diabetes, BPPV who presented to the emergency room with blurry vision and diplopia for 2 days along with stabbing headache mostly on the back of her head.  She had urinary symptoms and was treated with doxycycline since last 7 days.  She also developed diffuse rash while taking it.  In the emergency room afebrile.  Heart rate normal.  Blood pressure systolic 258-527.  Patient was given labetalol in the ER and blood pressure improved.  Neurology was consulted.  MRI of the brain and MRA/MRV was done and was essentially normal.  She was also found to be suffering with acute kidney injury on CKDIIIb.  She has required hemodialysis initiation on 10/23 with IJ catheter placement.  Nephrology following.  2 units PRBCs transfused on 10/23 as well for acute anemia.  She continues to have some ongoing confusion.  Assessment & Plan:   Principal Problem:   Blurry vision, bilateral Active Problems:   Essential hypertension   Depression   Acute kidney injury superimposed on CKD (HCC)   Crohn's disease of small intestine with other complication ([place])   Hypomagnesemia   Acute hypokalemia   Acute hypoxemic respiratory failure-multifactorial with multifocal pneumonia versus acute diastolic CHF exacerbation -Volume management per hemodialysis -Monitor strict I's and O's and daily weights -Procalcitonin borderline, continue current IV antibiotics -2D echocardiogram pending -VQ scan with no indication for PE   Acute metabolic encephalopathy likely uremic -Ammonia and TSH within normal limits -Continue close monitoring   AKI on CKD stage IIIb -Appreciate nephrology today with initiation of urgent hemodialysis on 10/23 -Renal ultrasound with no significant  findings -Repeat a.m. labs -Awaiting renal recovery   Acute on chronic anemia-stable -Likely related to CKD -2 unit PRBC transfusion on 10/23 -Monitor for overt bleeding -Recheck CBC in a.m. -Stool occult pending   Hypertensive crisis-resolved -She was noted to have some hypotension which required midodrine use, this will be discontinued now -Resume home oral metoprolol with IV metoprolol ordered as needed for heart rate elevation   Acute diplopia and headaches-resolved -Unclear etiology and assessed by neurology -Likely was related to severe hypomagnesemia which has not been corrected -Continue to monitor repeat magnesium levels -Muscle relaxers as needed   Non-anion gap metabolic acidosis -Related to diarrhea and possibly AKI -Now will be initiated on hemodialysis -Continue to monitor repeat lab   Type 2 diabetes-controlled -Keep on SSI   Crohn's disease with chronic diarrhea -Humira to resume outpatient -Continue Bentyl   Skin rash -Primary cause is unknown, but started after doxycycline and Diflucan likely drug reaction -Continue to monitor   DVT prophylaxis: Heparin Code Status: Full Family Communication: Discussed with husband bedside 10/23 Disposition Plan:  Status is: Inpatient   Remains inpatient appropriate because: Patient is currently unstable with hypotension and requires ICU level monitoring as well as IV medications.     Consultants:  Nephrology Neurology   Procedures:  Right IJ hemodialysis catheter placement 10/23  Antimicrobials:  Anti-infectives (From admission, onward)    Start     Dose/Rate Route Frequency Ordered Stop   08/19/21 0745  ceFEPIme (MAXIPIME) 2 g in sodium chloride 0.9 % 100 mL IVPB        2 g 200 mL/hr over 30 Minutes Intravenous Every 24 hours 08/19/21 0651  08/19/21 0745  vancomycin (VANCOREADY) IVPB 750 mg/150 mL        750 mg 150 mL/hr over 60 Minutes Intravenous Every 48 hours 08/19/21 0651     08/18/21 0945   fluconazole (DIFLUCAN) tablet 150 mg        150 mg Oral  Once 08/18/21 8119 08/18/21 0951       Subjective: Patient seen and evaluated today with ongoing confusion noted.  She had tolerated hemodialysis well 10/23 and has stable hemoglobin levels.  No other acute overnight events noted.  Objective: Vitals:   08/21/21 0715 08/21/21 0902 08/21/21 0903 08/21/21 0904  BP:  135/72  135/72  Pulse:  (!) 110  (!) 111  Resp: 20  18   Temp:      TempSrc:      SpO2: 91%  91%   Weight:      Height:        Intake/Output Summary (Last 24 hours) at 08/21/2021 0940 Last data filed at 08/21/2021 0723 Gross per 24 hour  Intake 856.5 ml  Output 2430 ml  Net -1573.5 ml   Filed Weights   08/19/21 0600 08/20/21 0503 08/20/21 1250  Weight: 77.9 kg 75.7 kg 75.7 kg    Examination:  General exam: Appears calm and comfortable, confused Respiratory system: Clear to auscultation. Respiratory effort normal. Cardiovascular system: S1 & S2 heard, RRR.  Gastrointestinal system: Abdomen is soft Central nervous system: Alert and awake Extremities: No edema Skin: No significant lesions noted Psychiatry: Flat affect.    Data Reviewed: I have personally reviewed following labs and imaging studies  CBC: Recent Labs  Lab 08/16/21 1206 08/17/21 0542 08/18/21 0622 08/19/21 0620 08/20/21 0400 08/20/21 0512 08/20/21 1900 08/21/21 0445  WBC 9.4 9.0 5.8 4.8 12.2*  --   --  11.2*  NEUTROABS 6.6  --  3.1  --   --   --   --   --   HGB 9.7* 9.1* 7.6* 8.6* 6.8* 6.3* 9.6* 9.5*  HCT 30.4* 29.5* 23.8* 27.2* 21.2* 20.1* 27.8* 28.7*  MCV 95.9 100.3* 100.8* 100.4* 100.5*  --   --  92.3  PLT 216 187 134* 192 115*  --   --  147*   Basic Metabolic Panel: Recent Labs  Lab 08/17/21 0542 08/18/21 0622 08/19/21 0504 08/19/21 0511 08/20/21 0400 08/20/21 1900 08/21/21 0445  NA 137 135 136 135 134* 134* 135  K 4.9 5.5* 5.3* 5.3* 6.2* 3.4* 4.0  CL 109 107 105 105 106 99 100  CO2 17* 18* 18* 19* 14* 23  21*  GLUCOSE 221* 160* 133* 133* 146* 106* 126*  BUN 58* 73* 78* 79* 89* 40* 47*  CREATININE 3.21* 3.86* 3.93* 3.88* 4.63* 2.88* 3.29*  CALCIUM 6.5* 6.4* 7.6* 7.6* 7.2* 8.0* 8.3*  MG 2.1 1.6*  --  1.7 2.1  --  1.9  PHOS  --  5.7* 7.0*  --   --   --   --    GFR: Estimated Creatinine Clearance: 16.1 mL/min (A) (by C-G formula based on SCr of 3.29 mg/dL (H)). Liver Function Tests: Recent Labs  Lab 08/16/21 1206 08/19/21 0504 08/19/21 0511  AST 21  --  22  ALT 17  --  15  ALKPHOS 57  --  69  BILITOT 1.0  --  0.6  PROT 6.9  --  6.1*  ALBUMIN 3.4* 3.1* 3.0*   No results for input(s): LIPASE, AMYLASE in the last 168 hours. Recent Labs  Lab 08/20/21 0803  AMMONIA  30   Coagulation Profile: Recent Labs  Lab 08/20/21 1027  INR 1.6*   Cardiac Enzymes: No results for input(s): CKTOTAL, CKMB, CKMBINDEX, TROPONINI in the last 168 hours. BNP (last 3 results) No results for input(s): PROBNP in the last 8760 hours. HbA1C: No results for input(s): HGBA1C in the last 72 hours. CBG: Recent Labs  Lab 08/20/21 0851 08/20/21 1107 08/20/21 1713 08/20/21 2212 08/21/21 0818  GLUCAP 174* 121* 128* 117* 146*   Lipid Profile: No results for input(s): CHOL, HDL, LDLCALC, TRIG, CHOLHDL, LDLDIRECT in the last 72 hours. Thyroid Function Tests: Recent Labs    08/20/21 0803  TSH 0.734   Anemia Panel: No results for input(s): VITAMINB12, FOLATE, FERRITIN, TIBC, IRON, RETICCTPCT in the last 72 hours. Sepsis Labs: Recent Labs  Lab 08/19/21 0620  PROCALCITON 0.31    Recent Results (from the past 240 hour(s))  Resp Panel by RT-PCR (Flu A&B, Covid) Nasopharyngeal Swab     Status: None   Collection Time: 08/16/21  8:28 PM   Specimen: Nasopharyngeal Swab; Nasopharyngeal(NP) swabs in vial transport medium  Result Value Ref Range Status   SARS Coronavirus 2 by RT PCR NEGATIVE NEGATIVE Final    Comment: (NOTE) SARS-CoV-2 target nucleic acids are NOT DETECTED.  The SARS-CoV-2 RNA is  generally detectable in upper respiratory specimens during the acute phase of infection. The lowest concentration of SARS-CoV-2 viral copies this assay can detect is 138 copies/mL. A negative result does not preclude SARS-Cov-2 infection and should not be used as the sole basis for treatment or other patient management decisions. A negative result may occur with  improper specimen collection/handling, submission of specimen other than nasopharyngeal swab, presence of viral mutation(s) within the areas targeted by this assay, and inadequate number of viral copies(<138 copies/mL). A negative result must be combined with clinical observations, patient history, and epidemiological information. The expected result is Negative.  Fact Sheet for Patients:  EntrepreneurPulse.com.au  Fact Sheet for Healthcare Providers:  IncredibleEmployment.be  This test is no t yet approved or cleared by the [place] FDA and  has been authorized for detection and/or diagnosis of SARS-CoV-2 by FDA under an Emergency Use Authorization (EUA). This EUA will remain  in effect (meaning this test can be used) for the duration of the COVID-19 declaration under Section 564(b)(1) of the Act, 21 U.S.C.section 360bbb-3(b)(1), unless the authorization is terminated  or revoked sooner.       Influenza A by PCR NEGATIVE NEGATIVE Final   Influenza B by PCR NEGATIVE NEGATIVE Final    Comment: (NOTE) The Xpert Xpress SARS-CoV-2/FLU/RSV plus assay is intended as an aid in the diagnosis of influenza from Nasopharyngeal swab specimens and should not be used as a sole basis for treatment. Nasal washings and aspirates are unacceptable for Xpert Xpress SARS-CoV-2/FLU/RSV testing.  Fact Sheet for Patients: EntrepreneurPulse.com.au  Fact Sheet for Healthcare Providers: IncredibleEmployment.be  This test is not yet approved or cleared by the [place] FDA and has been authorized for detection and/or diagnosis of SARS-CoV-2 by FDA under an Emergency Use Authorization (EUA). This EUA will remain in effect (meaning this test can be used) for the duration of the COVID-19 declaration under Section 564(b)(1) of the Act, 21 U.S.C. section 360bbb-3(b)(1), unless the authorization is terminated or revoked.  Performed at [facility], [address] [place], [place] [address]   Urine Culture     Status: None   Collection Time: 08/17/21 11:00 AM   Specimen: Urine, Clean Catch  Result Value  Ref Range Status   Specimen Description   Final    URINE, CLEAN CATCH Performed at [facility], [address] [place], [place] [address]    Special Requests   Final    NONE Performed at [facility], [address], [place], [place] [address]    Culture   Final    NO GROWTH Performed at [facility], [address], [place], [place] [address]    Report Status 08/19/2021 FINAL  Final  Culture, blood (Routine X 2) w Reflex to ID Panel     Status: None (Preliminary result)   Collection Time: 08/19/21  7:28 AM   Specimen: BLOOD LEFT ARM  Result Value Ref Range Status   Specimen Description BLOOD LEFT ARM BOTTLES DRAWN AEROBIC AND ANAEROBIC  Final   Special Requests   Final    Blood Culture results may not be optimal due to an excessive volume of blood received in culture bottles   Culture   Final    NO GROWTH 2 DAYS Performed at [facility], [address] [place], [place] [address]    Report Status PENDING  Incomplete  Culture, blood (Routine X 2) w Reflex to ID Panel     Status: None (Preliminary result)   Collection Time: 08/19/21  8:27 AM   Specimen: BLOOD LEFT HAND  Result Value Ref Range Status   Specimen Description   Final    BLOOD LEFT HAND BOTTLES DRAWN AEROBIC AND ANAEROBIC   Special Requests Blood Culture adequate volume  Final   Culture   Final    NO GROWTH 2 DAYS Performed at [facility], [address],  [place], [place] [address]    Report Status PENDING  Incomplete         Radiology Studies: NM Pulmonary Perfusion  Result Date: 08/19/2021 CLINICAL DATA:  Shortness of breath and chest pain. Positive D-dimer. EXAM: NUCLEAR MEDICINE PERFUSION LUNG SCAN TECHNIQUE: Perfusion images were obtained in multiple projections after intravenous injection of radiopharmaceutical. Ventilation scans intentionally deferred if perfusion scan and chest x-ray adequate for interpretation during COVID 19 epidemic. RADIOPHARMACEUTICALS:  4.4 mCi Tc-62m MAA IV COMPARISON:  Chest radiography same day. FINDINGS: No clear segmental or subsegmental defect. Mild diffuse patchy pattern consistent with the abnormal chest radiography showing widespread bilateral airspace filling. Consider CT scan of the chest (without contrast if the patient cannot tolerate contrast administration) for better evaluation of parenchymal disease. IMPRESSION: No arterial filling defect to suggest embolic disease. Mild patchy pattern consistent with a widespread airspace disease shown by radiography earlier today. Electronically Signed   By: Nelson Chimes M.D.   On: 08/19/2021 13:40   DG Chest Port 1 View  Result Date: 08/20/2021 CLINICAL DATA:  Status post dialysis catheter placement. EXAM: PORTABLE CHEST 1 VIEW COMPARISON:  08/19/2021 FINDINGS: Right IJ catheter has been placed with tip at the cavoatrial junction. No pneumothorax visualized. Unchanged cardiac enlargement. Persistent bilateral nodular and confluent airspace opacities involving the upper and lower lung zones. The appearance is not significantly changed compared with previous exam. No signs of pleural effusion. IMPRESSION: 1. Interval placement of right IJ catheter with tip at the cavoatrial junction. 2. No change in aeration to the lungs compared with previous exam. Electronically Signed   By: Kerby Moors M.D.   On: 08/20/2021 12:57        Scheduled Meds:  allopurinol  100 mg Oral  Daily   atorvastatin  80 mg Oral QHS   Chlorhexidine Gluconate Cloth  6 each Topical Daily   citalopram  20 mg Oral Daily   dextromethorphan-guaiFENesin  1 tablet Oral BID   dicyclomine  20 mg Oral TID AC   gabapentin  300 mg Oral BID   heparin injection (subcutaneous)  5,000 Units Subcutaneous Q8H   insulin aspart  0-9 Units Subcutaneous TID WC   insulin detemir  15 Units Subcutaneous QHS   ipratropium-albuterol  3 mL Nebulization Q6H   lipase/protease/amylase  36,000-72,000 Units Oral See admin instructions   metoprolol tartrate  100 mg Oral BID   pantoprazole  40 mg Oral Daily   sodium bicarbonate  650 mg Oral TID   venlafaxine XR  150 mg Oral Daily   Continuous Infusions:  sodium chloride     sodium chloride     ceFEPime (MAXIPIME) IV 2 g (08/21/21 0734)   vancomycin 750 mg (08/21/21 0815)     LOS: 4 days    Time spent: 35 minutes    Francisca Harbuck Darleen Crocker, DO [facility]  If 7PM-7AM, please contact night-coverage [email or web address] 08/21/2021, 9:40 AM

## 2021-08-21 NOTE — TOC Initial Note (Signed)
Transition of Care [facility]) - Initial/Assessment Note    Patient Details  Name: Heather [name] MRN: [id number] Date of Birth: 1950-11-26  Transition of Care [facility]) CM/SW Contact:    Salome Arnt, [place] Phone Number: 08/21/2021, 2:52 PM  Clinical Narrative:  TOC completed assessment due to high risk readmission score. Assessment completed with pt's husband as pt was not feeling well. Pt's husband reports it is just the two of them at home. Pt is fairly independent with ADLs, but they both need assistance with household tasks and getting groceries. Their grandson assists at home. Pt's husband is hopeful that she will be able to return when medically stable. Discussed pt may need PT evaluation to determine appropriate d/c plan. Pt had emergent dialysis yesterday. Now holding HD and monitoring for renal recovery. TOC will continue to follow.                 Expected Discharge Plan: [place] Barriers to Discharge: Continued Medical Work up   Patient Goals and CMS Choice Patient states their goals for this hospitalization and ongoing recovery are:: hopeful for return home   Choice offered to / list presented to : Spouse  Expected Discharge Plan and Services Expected Discharge Plan: [place] In-house Referral: Clinical Social Work     Living arrangements for the past 2 months: [place]                 DME Arranged: N/A                    Prior Living Arrangements/Services Living arrangements for the past 2 months: Single Family Home Lives with:: Spouse Patient language and need for interpreter reviewed:: Yes Do you feel safe going back to the place where you live?: Yes      Need for Family Participation in Patient Care: Yes (Comment)     Criminal Activity/Legal Involvement Pertinent to Current Situation/Hospitalization: No - Comment as needed  Activities of Daily Living Home Assistive Devices/Equipment: CBG  Meter ADL Screening (condition at time of admission) Patient's cognitive ability adequate to safely complete daily activities?: Yes Is the patient deaf or have difficulty hearing?: No Does the patient have difficulty seeing, even when wearing glasses/contacts?: Yes Does the patient have difficulty concentrating, remembering, or making decisions?: No Patient able to express need for assistance with ADLs?: No Does the patient have difficulty dressing or bathing?: No Independently performs ADLs?: Yes (appropriate for developmental age) Does the patient have difficulty walking or climbing stairs?: No Weakness of Legs: Both Weakness of Arms/Hands: None  Permission Sought/Granted                  Emotional Assessment   Attitude/Demeanor/Rapport: Unable to Assess Affect (typically observed): Unable to Assess   Alcohol / Substance Use: Not Applicable Psych Involvement: No (comment)  Admission diagnosis:  Hypocalcemia [E83.51] Hypokalemia [E87.6] Hypoglycemia [E16.2] Occipital headache [R51.9] Blurry vision, bilateral [H53.8] Blurred vision, bilateral [H53.8] AKI (acute kidney injury) ([place]) [N17.9] Patient Active Problem List   Diagnosis Date Noted   Blurry vision, bilateral 08/16/2021   Acute pancreatitis    Anemia    E coli enteritis 01/09/2021   Acute on chronic pancreatitis ([place]) 01/08/2021   Acute renal failure superimposed on stage 3b chronic kidney disease ([place]) 01/07/2021   Diarrhea    Acute hypokalemia 01/06/2021   Long-term use of immunosuppressant medication-anticipated 11/28/2020   Headache 05/17/2020   Hypokalemia 05/17/2020  Hypomagnesemia 05/17/2020   Dehydration 05/17/2020   Hypertensive urgency 05/17/2020   Diplopia 05/17/2020   Left hip pain 09/22/2019   Lumbar radiculopathy 09/22/2019   Postoperative anemia due to acute blood loss 09/18/2019   Postoperative urinary retention 09/18/2019   Closed fracture of distal end of radius 05/26/2019   Closed  right hip fracture ([place]) 04/08/2018   Elevated lipase    Crohn's disease of small intestine with other complication ([place])    Protein-calorie malnutrition, severe 03/05/2018   Neck pain 03/12/2017   Acute kidney injury superimposed on CKD ([place]) 01/25/2016   Neuropathy 03/02/2015   Pain in the chest    Chest pain 12/22/2014   Essential hypertension 12/22/2014   Type 2 diabetes with nephropathy ([place]) 12/22/2014   Hiatal hernia 12/22/2014   GERD (gastroesophageal reflux disease) 12/22/2014   Gout 12/22/2014   Mixed hyperlipidemia 12/22/2014   Depression 12/22/2014   Type 2 diabetes mellitus without complications ([place]) 34/28/7681   Fusion of spine, cervicothoracic region 11/01/2014   Pseudophakia, left eye 10/20/2014   Chronic kidney disease, stage III (moderate) ([place]) 08/25/2014   Cervical stenosis of spine 08/26/2013   Vitamin B12 deficiency 07/08/2011   Personal history of failed moderate sedation 04/16/2011   Chronic Nausea and Vomiting - ? gastroapresis 04/16/2011   Early satiety 04/16/2011   Irritable bowel syndrome 04/16/2011   Benign paroxysmal positional vertigo 11/20/2010   TRICUSPID REGURGITATION, MODERATE 11/20/2010   PULMONARY HYPERTENSION, MILD 11/20/2010   LEG EDEMA, BILATERAL 11/20/2010   DYSPNEA 11/20/2010   NONSPECIFIC ABNORMAL ELECTROCARDIOGRAM 11/20/2010   CHEST WALL PAIN, HX OF 11/20/2010   PCP:  [name], MD Pharmacy:   [facility] [address] [place] [address] Phone: [phone] Fax: [facility] [place], [place] [address] Phone: [phone] Fax: [phone]     Social Determinants of Health (SDOH) Interventions    Readmission Risk Interventions Readmission Risk Prevention Plan 08/21/2021 01/09/2021  Transportation Screening Complete Complete  PCP or Specialist Appt within 3-5 Days - Complete  HRI or [place] -  Complete  Social Work Consult for [place] Planning/Counseling - Complete  Palliative Care Screening - Not Applicable  Medication Review Press photographer) Complete Complete  HRI or Home Care Consult Complete -  SW Recovery Care/Counseling Consult Complete -  Palliative Care Screening Not Applicable -  [place] Not Applicable -  Some recent data might be hidden

## 2021-08-22 DIAGNOSIS — H538 Other visual disturbances: Secondary | ICD-10-CM | POA: Diagnosis not present

## 2021-08-22 LAB — GLUCOSE, CAPILLARY
Glucose-Capillary: 116 mg/dL — ABNORMAL HIGH (ref 70–99)
Glucose-Capillary: 98 mg/dL (ref 70–99)
Glucose-Capillary: 105 mg/dL — ABNORMAL HIGH (ref 70–99)
Glucose-Capillary: 157 mg/dL — ABNORMAL HIGH (ref 70–99)

## 2021-08-22 LAB — ANCA PROFILE
Anti-MPO Antibodies: <0.2 units (ref 0.0–0.9)
Anti-PR3 Antibodies: <0.2 units (ref 0.0–0.9)
Atypical P-ANCA titer: <1:20 {titer}
C-ANCA: <1:20 {titer}
P-ANCA: <1:20 {titer}

## 2021-08-22 LAB — CBC
HCT: 25.8 % — ABNORMAL LOW (ref 36.0–46.0)
Hemoglobin: 8.6 g/dL — ABNORMAL LOW (ref 12.0–15.0)
MCH: 31.2 pg (ref 26.0–34.0)
MCHC: 33.3 g/dL (ref 30.0–36.0)
MCV: 93.5 fL (ref 80.0–100.0)
Platelets: 82 10*3/uL — ABNORMAL LOW (ref 150–400)
RBC: 2.76 MIL/uL — ABNORMAL LOW (ref 3.87–5.11)
RDW: 16.3 % — ABNORMAL HIGH (ref 11.5–15.5)
WBC: 10 10*3/uL (ref 4.0–10.5)
nRBC: 0.2 % (ref 0.0–0.2)

## 2021-08-22 LAB — RENAL FUNCTION PANEL
Albumin: 2.4 g/dL — ABNORMAL LOW (ref 3.5–5.0)
Anion gap: 11 (ref 5–15)
BUN: 57 mg/dL — ABNORMAL HIGH (ref 8–23)
CO2: 24 mmol/L (ref 22–32)
Calcium: 8.8 mg/dL — ABNORMAL LOW (ref 8.9–10.3)
Chloride: 101 mmol/L (ref 98–111)
Creatinine, Ser: 3.71 mg/dL — ABNORMAL HIGH (ref 0.44–1.00)
GFR, Estimated: 13 mL/min — ABNORMAL LOW (ref >60)
Glucose, Bld: 149 mg/dL — ABNORMAL HIGH (ref 70–99)
Phosphorus: 5.3 mg/dL — ABNORMAL HIGH (ref 2.5–4.6)
Potassium: 3.7 mmol/L (ref 3.5–5.1)
Sodium: 136 mmol/L (ref 135–145)

## 2021-08-22 LAB — MAGNESIUM: Magnesium: 2 mg/dL (ref 1.7–2.4)

## 2021-08-22 LAB — LEGIONELLA PNEUMOPHILA SEROGP 1 UR AG: L. pneumophila Serogp 1 Ur Ag: NEGATIVE

## 2021-08-22 LAB — HEPATITIS B SURFACE ANTIBODY, QUANTITATIVE: Hep B S AB Quant (Post): <3.1 m[IU]/mL — ABNORMAL LOW (ref >9.9)

## 2021-08-22 LAB — ANA W/REFLEX IF POSITIVE: Anti Nuclear Antibody (ANA): NEGATIVE

## 2021-08-22 MED ORDER — MIDODRINE HCL 5 MG PO TABS
5.0000 mg | ORAL_TABLET | Freq: Two times a day (BID) | ORAL | Status: DC
  Administered 2021-08-22 (×2): 5 mg via ORAL
  Filled 2021-08-22 (×2): qty 1

## 2021-08-22 MED ORDER — LINEZOLID 600 MG PO TABS
600.0000 mg | ORAL_TABLET | Freq: Two times a day (BID) | ORAL | Status: DC
  Administered 2021-08-23 – 2021-08-25 (×3): 600 mg via ORAL
  Filled 2021-08-22 (×6): qty 1

## 2021-08-22 MED ORDER — LORAZEPAM 2 MG/ML IJ SOLN
2.0000 mg | INTRAMUSCULAR | Status: DC | PRN
  Administered 2021-08-22 – 2021-08-25 (×5): 2 mg via INTRAVENOUS
  Filled 2021-08-22 (×5): qty 1

## 2021-08-22 MED ORDER — ALBUMIN HUMAN 25 % IV SOLN
INTRAVENOUS | Status: AC
  Administered 2021-08-22: 25 g
  Filled 2021-08-22: qty 100

## 2021-08-22 MED ORDER — HEPARIN SODIUM (PORCINE) 1000 UNIT/ML IJ SOLN
INTRAMUSCULAR | Status: AC
  Administered 2021-08-22: 1000 [IU]
  Filled 2021-08-22: qty 4

## 2021-08-22 NOTE — Progress Notes (Signed)
PROGRESS NOTE    Heather [name]  WJX:[id number] DOB: [birth year] DOA: 08/16/2021 PCP: [name], MD   Brief Narrative:   70 year old female with history of CKD stage IIIb, Crohn's disease and IBS with chronic diarrhea, hypertension, type 2 diabetes, BPPV who presented to the emergency room with blurry vision and diplopia for 2 days along with stabbing headache mostly on the back of her head.  She had urinary symptoms and was treated with doxycycline since last 7 days.  She also developed diffuse rash while taking it.  In the emergency room afebrile.  Heart rate normal.  Blood pressure systolic 213-086.  Patient was given labetalol in the ER and blood pressure improved.  Neurology was consulted.  MRI of the brain and MRA/MRV was done and was essentially normal.  She was also found to be suffering with acute kidney injury on CKDIIIb.  She has required hemodialysis initiation on 10/23 with IJ catheter placement.  2 units PRBCs transfused on 10/23 as well for acute anemia.  She continues to have some ongoing confusion.  Nephrology continues to follow to determine need for long-term hemodialysis.  Assessment & Plan:   Principal Problem:   Blurry vision, bilateral Active Problems:   Essential hypertension   Depression   Acute kidney injury superimposed on CKD (HCC)   Crohn's disease of small intestine with other complication (HCC)   Hypomagnesemia   Acute hypokalemia   Acute hypoxemic respiratory failure-multifactorial with multifocal pneumonia versus acute diastolic CHF exacerbation -Volume management per hemodialysis -Monitor strict I's and O's and daily weights -Procalcitonin borderline, continue current IV antibiotics and change Vanco to Linezolid 10/26 -2D echocardiogram LVEF 60-65% -VQ scan with no indication for PE   Acute metabolic encephalopathy likely uremic -Ammonia and TSH within normal limits -Continue close monitoring   AKI on CKD stage IIIb -Appreciate nephrology  today with further hemodialysis planned; initiation of urgent hemodialysis on 10/23 -Renal ultrasound with no significant findings -Repeat a.m. labs -Awaiting renal recovery versus need for chronic hemodialysis   Acute on chronic anemia-stable -Likely related to CKD -2 unit PRBC transfusion on 10/23 -Monitor for overt bleeding -Recheck CBC in a.m. -Stool occult pending   Hypertensive crisis-resolved -She was noted to have some hypotension which required midodrine use, this will be discontinued now -Resume home oral metoprolol with IV metoprolol ordered as needed for heart rate elevation   Acute diplopia and headaches-resolved -Unclear etiology and assessed by neurology -Likely was related to severe hypomagnesemia which has not been corrected -Continue to monitor repeat magnesium levels -Muscle relaxers as needed   Non-anion gap metabolic acidosis -Related to diarrhea and possibly AKI -Now will be initiated on hemodialysis -Continue to monitor repeat lab   Type 2 diabetes-controlled -Keep on SSI   Crohn's disease with chronic diarrhea -Humira to resume outpatient -Continue Bentyl   Skin rash -Primary cause is unknown, but started after doxycycline and Diflucan likely drug reaction -Continue to monitor   DVT prophylaxis: Heparin Code Status: Full Family Communication: Discussed with husband bedside 10/23 Disposition Plan:  Status is: Inpatient   Remains inpatient appropriate because: Patient is currently unstable with hypotension and requires ICU level monitoring as well as IV medications.     Consultants:  Nephrology Neurology   Procedures:  Right IJ hemodialysis catheter placement 10/23  Antimicrobials:  Anti-infectives (From admission, onward)    Start     Dose/Rate Route Frequency Ordered Stop   08/23/21 1000  linezolid (ZYVOX) tablet 600 mg  600 mg Oral Every 12 hours 08/22/21 1038 08/26/21 0959   08/19/21 0745  ceFEPIme (MAXIPIME) 2 g in sodium  chloride 0.9 % 100 mL IVPB        2 g 200 mL/hr over 30 Minutes Intravenous Every 24 hours 08/19/21 0651     08/19/21 0745  vancomycin (VANCOREADY) IVPB 750 mg/150 mL  Status:  Discontinued        750 mg 150 mL/hr over 60 Minutes Intravenous Every 48 hours 08/19/21 0651 08/22/21 1038   08/18/21 0945  fluconazole (DIFLUCAN) tablet 150 mg        150 mg Oral  Once 08/18/21 1610 08/18/21 0951      Subjective: Patient seen and evaluated today with ongoing confusion noted.  She is still requiring high flow nasal cannula oxygen and chest x-ray yesterday demonstrates worsening bilateral airspace disease.  She has not had much urine output from IV Lasix.  Objective: Vitals:   08/22/21 1017 08/22/21 1030 08/22/21 1032 08/22/21 1040  BP: (!) 122/43 (!) 136/52 (!) 101/53 (!) 153/54  Pulse: 91  81   Resp: 16  18   Temp:  98.5 F (36.9 C)    TempSrc:  Oral    SpO2: 94%  97%   Weight:  75 kg    Height:        Intake/Output Summary (Last 24 hours) at 08/22/2021 1045 Last data filed at 08/22/2021 0947 Gross per 24 hour  Intake 520.68 ml  Output 600 ml  Net -79.32 ml   Filed Weights   08/20/21 1250 08/22/21 0433 08/22/21 1030  Weight: 75.7 kg 75.1 kg 75 kg    Examination:  General exam: Appears calm and comfortable, confused and minimally agitated Respiratory system: Clear to auscultation. Respiratory effort normal.  Currently on high flow nasal cannula. Cardiovascular system: S1 & S2 heard, RRR.  Gastrointestinal system: Abdomen is soft Central nervous system: Alert and awake Extremities: No edema Skin: No significant lesions noted Psychiatry: Flat affect.    Data Reviewed: I have personally reviewed following labs and imaging studies  CBC: Recent Labs  Lab 08/16/21 1206 08/17/21 0542 08/18/21 0622 08/19/21 0620 08/20/21 0400 08/20/21 0512 08/20/21 1900 08/21/21 0445 08/22/21 0504  WBC 9.4   < > 5.8 4.8 12.2*  --   --  11.2* 10.0  NEUTROABS 6.6  --  3.1  --   --    --   --   --   --   HGB 9.7*   < > 7.6* 8.6* 6.8* 6.3* 9.6* 9.5* 8.6*  HCT 30.4*   < > 23.8* 27.2* 21.2* 20.1* 27.8* 28.7* 25.8*  MCV 95.9   < > 100.8* 100.4* 100.5*  --   --  92.3 93.5  PLT 216   < > 134* 192 115*  --   --  102* 82*   < > = values in this interval not displayed.   Basic Metabolic Panel: Recent Labs  Lab 08/18/21 0622 08/19/21 0504 08/19/21 0511 08/20/21 0400 08/20/21 1900 08/21/21 0445 08/22/21 0504  NA 135 136 135 134* 134* 135 136  K 5.5* 5.3* 5.3* 6.2* 3.4* 4.0 3.7  CL 107 105 105 106 99 100 101  CO2 18* 18* 19* 14* 23 21* 24  GLUCOSE 160* 133* 133* 146* 106* 126* 149*  BUN 73* 78* 79* 89* 40* 47* 57*  CREATININE 3.86* 3.93* 3.88* 4.63* 2.88* 3.29* 3.71*  CALCIUM 6.4* 7.6* 7.6* 7.2* 8.0* 8.3* 8.8*  MG 1.6*  --  1.7 2.1  --  1.9 2.0  PHOS 5.7* 7.0*  --   --   --   --  5.3*   GFR: Estimated Creatinine Clearance: 14.2 mL/min (A) (by C-G formula based on SCr of 3.71 mg/dL (H)). Liver Function Tests: Recent Labs  Lab 08/16/21 1206 08/19/21 0504 08/19/21 0511 08/22/21 0504  AST 21  --  22  --   ALT 17  --  15  --   ALKPHOS 57  --  69  --   BILITOT 1.0  --  0.6  --   PROT 6.9  --  6.1*  --   ALBUMIN 3.4* 3.1* 3.0* 2.4*   No results for input(s): LIPASE, AMYLASE in the last 168 hours. Recent Labs  Lab 08/20/21 0803  AMMONIA 30   Coagulation Profile: Recent Labs  Lab 08/20/21 1027  INR 1.6*   Cardiac Enzymes: No results for input(s): CKTOTAL, CKMB, CKMBINDEX, TROPONINI in the last 168 hours. BNP (last 3 results) No results for input(s): PROBNP in the last 8760 hours. HbA1C: No results for input(s): HGBA1C in the last 72 hours. CBG: Recent Labs  Lab 08/21/21 0818 08/21/21 1136 08/21/21 1616 08/21/21 2048 08/22/21 0813  GLUCAP 146* 204* 208* 175* 116*   Lipid Profile: No results for input(s): CHOL, HDL, LDLCALC, TRIG, CHOLHDL, LDLDIRECT in the last 72 hours. Thyroid Function Tests: Recent Labs    08/20/21 0803  TSH 0.734    Anemia Panel: No results for input(s): VITAMINB12, FOLATE, FERRITIN, TIBC, IRON, RETICCTPCT in the last 72 hours. Sepsis Labs: Recent Labs  Lab 08/19/21 0620  PROCALCITON 0.31    Recent Results (from the past 240 hour(s))  Resp Panel by RT-PCR (Flu A&B, Covid) Nasopharyngeal Swab     Status: None   Collection Time: 08/16/21  8:28 PM   Specimen: Nasopharyngeal Swab; Nasopharyngeal(NP) swabs in vial transport medium  Result Value Ref Range Status   SARS Coronavirus 2 by RT PCR NEGATIVE NEGATIVE Final    Comment: (NOTE) SARS-CoV-2 target nucleic acids are NOT DETECTED.  The SARS-CoV-2 RNA is generally detectable in upper respiratory specimens during the acute phase of infection. The lowest concentration of SARS-CoV-2 viral copies this assay can detect is 138 copies/mL. A negative result does not preclude SARS-Cov-2 infection and should not be used as the sole basis for treatment or other patient management decisions. A negative result may occur with  improper specimen collection/handling, submission of specimen other than nasopharyngeal swab, presence of viral mutation(s) within the areas targeted by this assay, and inadequate number of viral copies(<138 copies/mL). A negative result must be combined with clinical observations, patient history, and epidemiological information. The expected result is Negative.  Fact Sheet for Patients:  EntrepreneurPulse.com.au  Fact Sheet for Healthcare Providers:  IncredibleEmployment.be  This test is no t yet approved or cleared by the [place] FDA and  has been authorized for detection and/or diagnosis of SARS-CoV-2 by FDA under an Emergency Use Authorization (EUA). This EUA will remain  in effect (meaning this test can be used) for the duration of the COVID-19 declaration under Section 564(b)(1) of the Act, 21 U.S.C.section 360bbb-3(b)(1), unless the authorization is terminated  or revoked  sooner.       Influenza A by PCR NEGATIVE NEGATIVE Final   Influenza B by PCR NEGATIVE NEGATIVE Final    Comment: (NOTE) The Xpert Xpress SARS-CoV-2/FLU/RSV plus assay is intended as an aid in the diagnosis of influenza from Nasopharyngeal swab specimens and should not be used as a sole basis for treatment.  Nasal washings and aspirates are unacceptable for Xpert Xpress SARS-CoV-2/FLU/RSV testing.  Fact Sheet for Patients: EntrepreneurPulse.com.au  Fact Sheet for Healthcare Providers: IncredibleEmployment.be  This test is not yet approved or cleared by the [place] FDA and has been authorized for detection and/or diagnosis of SARS-CoV-2 by FDA under an Emergency Use Authorization (EUA). This EUA will remain in effect (meaning this test can be used) for the duration of the COVID-19 declaration under Section 564(b)(1) of the Act, 21 U.S.C. section 360bbb-3(b)(1), unless the authorization is terminated or revoked.  Performed at [facility], [address], [place], [place] [address]   Urine Culture     Status: None   Collection Time: 08/17/21 11:00 AM   Specimen: Urine, Clean Catch  Result Value Ref Range Status   Specimen Description   Final    URINE, CLEAN CATCH Performed at [facility], [address] [place], [place] [address]    Special Requests   Final    NONE Performed at [facility], [address], [place], [place] [address]    Culture   Final    NO GROWTH Performed at [facility], [place] [address], [place], [place] [address]    Report Status 08/19/2021 FINAL  Final  Culture, blood (Routine X 2) w Reflex to ID Panel     Status: None (Preliminary result)   Collection Time: 08/19/21  7:28 AM   Specimen: BLOOD LEFT ARM  Result Value Ref Range Status   Specimen Description BLOOD LEFT ARM BOTTLES DRAWN AEROBIC AND ANAEROBIC  Final   Special Requests   Final    Blood Culture results may not be optimal due to an excessive  volume of blood received in culture bottles   Culture   Final    NO GROWTH 3 DAYS Performed at [facility], [address], [place], [place] [address]    Report Status PENDING  Incomplete  Culture, blood (Routine X 2) w Reflex to ID Panel     Status: None (Preliminary result)   Collection Time: 08/19/21  8:27 AM   Specimen: BLOOD LEFT HAND  Result Value Ref Range Status   Specimen Description   Final    BLOOD LEFT HAND BOTTLES DRAWN AEROBIC AND ANAEROBIC   Special Requests Blood Culture adequate volume  Final   Culture   Final    NO GROWTH 3 DAYS Performed at [facility], [address] [place], [place] [address]    Report Status PENDING  Incomplete  MRSA Next Gen by PCR, Nasal     Status: Abnormal   Collection Time: 08/21/21  5:40 PM   Specimen: Nasal Mucosa; Nasal Swab  Result Value Ref Range Status   MRSA by PCR Next Gen DETECTED (A) NOT DETECTED Final    Comment: RESULT CALLED TO, READ BACK BY AND VERIFIED WITH: JESSICA HEARN RN,2021,08/21/2021, SELF S. (NOTE) The GeneXpert MRSA Assay (FDA approved for NASAL specimens only), is one component of a comprehensive MRSA colonization surveillance program. It is not intended to diagnose MRSA infection nor to guide or monitor treatment for MRSA infections. Test performance is not FDA approved in patients less than 27 years old. Performed at [facility], [address], [place], [place] [address]          Radiology Studies: DG CHEST PORT 1 VIEW  Result Date: 08/21/2021 CLINICAL DATA:  Hypoxemia EXAM: PORTABLE CHEST 1 VIEW COMPARISON:  08/20/2021, 08/19/2021, 03/29/2021 FINDINGS: Right IJ central venous catheter tip over the cavoatrial region. Cardiomegaly. Slight  worsening of diffuse bilateral airspace disease. No pneumothorax. Hardware in the cervicothoracic spine. IMPRESSION: 1. Slight interval worsening of diffuse bilateral airspace disease which may be secondary to edema or infection 2. Cardiomegaly Electronically Signed    By: Donavan Foil M.D.   On: 08/21/2021 15:19   DG Chest Port 1 View  Result Date: 08/20/2021 CLINICAL DATA:  Status post dialysis catheter placement. EXAM: PORTABLE CHEST 1 VIEW COMPARISON:  08/19/2021 FINDINGS: Right IJ catheter has been placed with tip at the cavoatrial junction. No pneumothorax visualized. Unchanged cardiac enlargement. Persistent bilateral nodular and confluent airspace opacities involving the upper and lower lung zones. The appearance is not significantly changed compared with previous exam. No signs of pleural effusion. IMPRESSION: 1. Interval placement of right IJ catheter with tip at the cavoatrial junction. 2. No change in aeration to the lungs compared with previous exam. Electronically Signed   By: Kerby Moors M.D.   On: 08/20/2021 12:57   ECHOCARDIOGRAM COMPLETE  Result Date: 08/21/2021    ECHOCARDIOGRAM REPORT   Patient Name:   MAKELLA BUCKINGHAM Date of Exam: 08/21/2021 Medical Rec #:  [id number]           Height:       64.0 in Accession #:    [id number]          Weight:       166.9 lb Date of Birth:  [birth year]           BSA:          1.812 m Patient Age:    19 years            BP:           132/90 mmHg Patient Gender: F                   HR:           106 bpm. Exam Location:  [facility] Procedure: 2D Echo, Cardiac Doppler and Color Doppler Indications:    CHF-Acute Diastolic  History:        Patient has prior history of Echocardiogram examinations, most                 recent 05/17/2020. Stroke, Signs/Symptoms:Chest Pain; Risk                 Factors:Diabetes, Hypertension and Dyslipidemia.  Sonographer:    Wenda Low Referring Phys: [id number] [place] [facility]  Sonographer Comments: Image acquisition challenging due to patient behavioral factors. Patient with AMS is constantly moving. IMPRESSIONS  1. Left ventricular ejection fraction, by estimation, is 60 to 65%. The left ventricle has normal function. The left ventricle has no regional wall motion abnormalities. Left  ventricular diastolic parameters were normal.  2. Right ventricular systolic function is normal. The right ventricular size is normal. There is mildly elevated pulmonary artery systolic pressure.  3. Left atrial size was mildly dilated.  4. The mitral valve is normal in structure. Mild mitral valve regurgitation.  5. The aortic valve is tricuspid. Aortic valve regurgitation is not visualized. Mild aortic valve sclerosis is present, with no evidence of aortic valve stenosis.  6. The inferior vena cava is normal in size with greater than 50% respiratory variability, suggesting right atrial pressure of 3 mmHg. Comparison(s): The left ventricular function is unchanged. FINDINGS  Left Ventricle: Left ventricular ejection fraction, by estimation, is 60 to 65%. The left ventricle has normal function. The left ventricle has no regional wall motion abnormalities.  The left ventricular internal cavity size was normal in size. There is  no left ventricular hypertrophy. Left ventricular diastolic parameters were normal. Right Ventricle: The right ventricular size is normal. Right vetricular wall thickness was not assessed. Right ventricular systolic function is normal. There is mildly elevated pulmonary artery systolic pressure. The tricuspid regurgitant velocity is 2.70 m/s, and with an assumed right atrial pressure of 8 mmHg, the estimated right ventricular systolic pressure is 50.9 mmHg. Left Atrium: Left atrial size was mildly dilated. Right Atrium: Right atrial size was normal in size. Pericardium: There is no evidence of pericardial effusion. Mitral Valve: The mitral valve is normal in structure. Mild to moderate mitral annular calcification. Mild mitral valve regurgitation. MV peak gradient, 6.1 mmHg. The mean mitral valve gradient is 3.0 mmHg. Tricuspid Valve: The tricuspid valve is normal in structure. Tricuspid valve regurgitation is trivial. Aortic Valve: The aortic valve is tricuspid. Aortic valve regurgitation is  not visualized. Mild aortic valve sclerosis is present, with no evidence of aortic valve stenosis. Aortic valve mean gradient measures 6.0 mmHg. Aortic valve peak gradient measures 10.8 mmHg. Aortic valve area, by VTI measures 2.83 cm. Pulmonic Valve: The pulmonic valve was grossly normal. Pulmonic valve regurgitation is not visualized. Aorta: The aortic root is normal in size and structure. Venous: The inferior vena cava is normal in size with greater than 50% respiratory variability, suggesting right atrial pressure of 3 mmHg. IAS/Shunts: The interatrial septum was not assessed.  LEFT VENTRICLE PLAX 2D LVIDd:         4.60 cm   Diastology LVIDs:         2.90 cm   LV e' medial:    7.72 cm/s LV PW:         1.10 cm   LV E/e' medial:  13.7 LV IVS:        1.00 cm   LV e' lateral:   14.90 cm/s LVOT diam:     2.00 cm   LV E/e' lateral: 7.1 LV SV:         89 LV SV Index:   49 LVOT Area:     3.14 cm  RIGHT VENTRICLE RV Basal diam:  2.60 cm RV Mid diam:    2.40 cm RV S prime:     10.10 cm/s TAPSE (M-mode): 2.3 cm LEFT ATRIUM             Index        RIGHT ATRIUM           Index LA diam:        3.70 cm 2.04 cm/m   RA Area:     15.50 cm LA Vol (A2C):   66.7 ml 36.82 ml/m  RA Volume:   39.00 ml  21.53 ml/m LA Vol (A4C):   52.4 ml 28.93 ml/m LA Biplane Vol: 62.0 ml 34.23 ml/m  AORTIC VALVE                     PULMONIC VALVE AV Area (Vmax):    2.41 cm      PV Vmax:       0.86 m/s AV Area (Vmean):   2.72 cm      PV Peak grad:  2.9 mmHg AV Area (VTI):     2.83 cm AV Vmax:           164.00 cm/s AV Vmean:          108.000 cm/s AV VTI:  0.315 m AV Peak Grad:      10.8 mmHg AV Mean Grad:      6.0 mmHg LVOT Vmax:         126.00 cm/s LVOT Vmean:        93.600 cm/s LVOT VTI:          0.284 m LVOT/AV VTI ratio: 0.90  AORTA Ao Root diam: 2.60 cm MITRAL VALVE                TRICUSPID VALVE MV Area (PHT): 5.20 cm     TR Peak grad:   29.2 mmHg MV Area VTI:   3.21 cm     TR Vmax:        270.00 cm/s MV Peak grad:  6.1 mmHg  MV Mean grad:  3.0 mmHg     SHUNTS MV Vmax:       1.23 m/s     Systemic VTI:  0.28 m MV Vmean:      79.6 cm/s    Systemic Diam: 2.00 cm MV Decel Time: 146 msec MV E velocity: 106.00 cm/s MV A velocity: 82.70 cm/s MV E/A ratio:  1.28 Dorris Carnes MD Electronically signed by Dorris Carnes MD Signature Date/Time: 08/21/2021/4:12:38 PM    Final         Scheduled Meds:  allopurinol  100 mg Oral Daily   atorvastatin  80 mg Oral QHS   Chlorhexidine Gluconate Cloth  6 each Topical Daily   citalopram  20 mg Oral Daily   dextromethorphan-guaiFENesin  1 tablet Oral BID   dicyclomine  20 mg Oral TID AC   gabapentin  300 mg Oral BID   heparin injection (subcutaneous)  5,000 Units Subcutaneous Q8H   heparin sodium (porcine)       insulin aspart  0-9 Units Subcutaneous TID WC   insulin detemir  15 Units Subcutaneous QHS   ipratropium-albuterol  3 mL Nebulization Q6H   [START ON 08/23/2021] linezolid  600 mg Oral Q12H   lipase/protease/amylase  36,000-72,000 Units Oral See admin instructions   metoprolol tartrate  100 mg Oral BID   midodrine  5 mg Oral BID WC   pantoprazole  40 mg Oral Daily   sodium bicarbonate  650 mg Oral TID   venlafaxine XR  150 mg Oral Daily   Continuous Infusions:  sodium chloride     sodium chloride     ceFEPime (MAXIPIME) IV 2 g (08/22/21 0928)   furosemide (LASIX) 200 mg in dextrose 5% 100 mL (2mg /mL) infusion 4 mg/hr (08/22/21 0206)     LOS: 5 days    Time spent: 35 minutes    Ariany Kesselman Darleen Crocker, DO [facility]  If 7PM-7AM, please contact night-coverage [email or web address] 08/22/2021, 10:45 AM

## 2021-08-22 NOTE — Progress Notes (Signed)
Pharmacy Antibiotic Note  Heather [name] is a 70 y.o. female admitted on 08/16/2021 with pneumonia.  Pharmacy has been consulted for Cefepime dosing. WBC WNL. Noted renal dysfunction. CXR with likely multi-focal pneumonia vs acute diastolic CHF exacerbation. PCT borderline.   Patient with +MRSA PCR +. Also, with worsening renal fxn and had to be dialyzed. Trying to avoid nephrotoxins=> d/w MD and will transition to linezolid starting tomorrow for  x 3days.   Plan: Continue Cefepime 2g IV q24h Linezolid 600mg  po bid x 3 days starting 10/26 Trend WBC, temp, renal function  F/U infectious work-up  Height: 5\' 4"  (162.6 cm) Weight: 75 kg (165 lb 5.5 oz) IBW/kg (Calculated) : 54.7  Temp (24hrs), Avg:98.6 F (37 C), Min:97.8 F (36.6 C), Max:99.5 F (37.5 C)  Recent Labs  Lab 08/18/21 0622 08/19/21 0504 08/19/21 0511 08/19/21 0620 08/20/21 0400 08/20/21 1900 08/21/21 0445 08/22/21 0504  WBC 5.8  --   --  4.8 12.2*  --  11.2* 10.0  CREATININE 3.86*   < > 3.88*  --  4.63* 2.88* 3.29* 3.71*   < > = values in this interval not displayed.     Estimated Creatinine Clearance: 14.2 mL/min (A) (by C-G formula based on SCr of 3.71 mg/dL (H)).    Allergies  Allergen Reactions   Duloxetine Hcl Swelling   Fluocinolone Swelling   Acetaminophen Itching and Swelling    Pt states "some swelling"   Amlodipine Besylate Swelling    Swelling of feet, fluid retention   Ciprofloxacin Nausea And Vomiting and Swelling    Swelling of face, jaw, and lips   Hydrocodone-Acetaminophen Itching   Metformin Other (See Comments)    REACTION: GI UPSET   Nsaids Other (See Comments)    "HAS BLEEDING ULCERS"   Quinolones Swelling   Sulfamethoxazole Swelling    Swelling of feet, legs   Valsartan Swelling   Cozaar [Losartan] Swelling and Rash   Lisinopril Swelling and Rash    Oral swelling, and red streaks on arms/stomach   Tape Itching and Rash    Paper tape can only be used on this patient    Antimicrobials: Vanco 10/22 >> 10/25 Cefepime 10/22 >> Linezolid 10/25>> x 3 days  Cultures: 10/22 Bcx: ngtd 10/20 Ucx: ng 10/24 MRSA PCR: positive  Isac Sarna, BS [place], BCPS Clinical Pharmacist Pager [phone]

## 2021-08-22 NOTE — Progress Notes (Signed)
Patient became increasingly agitated around 1530. Pt answered all orientation questions appropriately, however is hallucinating stating that other people are in the room "sitting around and talking". Patient began yelling at nursing staff and attempting to pull off gown and wires. PRN Haldol administered. [name] notified. After administration of haldol the pt's agitation seemed to worsen. [name] notified and orders received for PRN ativan. After administration of ativan pt calmed.

## 2021-08-22 NOTE — Progress Notes (Addendum)
Patient ID: Heather [name], female   DOB: 1951-06-05, 70 y.o.   MRN: [id number] S: Events of last night noted.  More confused this morning and still requiring high flow oxygen. ECHO was normal.  CXR with worsening bilateral airspace disease.  No significant response to IV lasix. O:BP (!) 169/63   Pulse 97   Temp 99.4 F (37.4 C) (Axillary)   Resp 16   Ht 5\' 4"  (1.626 m)   Wt 75.1 kg   LMP  (LMP Unknown)   SpO2 94%   BMI 28.42 kg/m   Intake/Output Summary (Last 24 hours) at 08/22/2021 1025 Last data filed at 08/22/2021 0947 Gross per 24 hour  Intake 520.68 ml  Output 600 ml  Net -79.32 ml   Intake/Output: I/O last 3 completed shifts: In: 520.7 [P.O.:245; I.V.:14.2; IV Piggyback:261.4] Out: 475 [Urine:475]  Intake/Output this shift:  Total I/O In: -  Out: 300 [Urine:300] Weight change: -0.6 kg SWH:QPRFFMBW and slightly agitated CVS:RRR Resp:occ wheezes at left base, otherwise CTA with poor inspiratory effort Abd:+BS, soft, NT/ND Ext: no edema  Recent Labs  Lab 08/16/21 1206 08/17/21 0542 08/18/21 0622 08/19/21 0504 08/19/21 0511 08/20/21 0400 08/20/21 1900 08/21/21 0445 08/22/21 0504  NA 139   < > 135 136 135 134* 134* 135 136  K 3.1*   < > 5.5* 5.3* 5.3* 6.2* 3.4* 4.0 3.7  CL 106   < > 107 105 105 106 99 100 101  CO2 21*   < > 18* 18* 19* 14* 23 21* 24  GLUCOSE 43*   < > 160* 133* 133* 146* 106* 126* 149*  BUN 56*   < > 73* 78* 79* 89* 40* 47* 57*  CREATININE 3.23*   < > 3.86* 3.93* 3.88* 4.63* 2.88* 3.29* 3.71*  ALBUMIN 3.4*  --   --  3.1* 3.0*  --   --   --  2.4*  CALCIUM 6.5*   < > 6.4* 7.6* 7.6* 7.2* 8.0* 8.3* 8.8*  PHOS  --   --  5.7* 7.0*  --   --   --   --  5.3*  AST 21  --   --   --  22  --   --   --   --   ALT 17  --   --   --  15  --   --   --   --    < > = values in this interval not displayed.   Liver Function Tests: Recent Labs  Lab 08/16/21 1206 08/19/21 0504 08/19/21 0511 08/22/21 0504  AST 21  --  22  --   ALT 17  --  15  --    ALKPHOS 57  --  69  --   BILITOT 1.0  --  0.6  --   PROT 6.9  --  6.1*  --   ALBUMIN 3.4* 3.1* 3.0* 2.4*   No results for input(s): LIPASE, AMYLASE in the last 168 hours. Recent Labs  Lab 08/20/21 0803  AMMONIA 30   CBC: Recent Labs  Lab 08/16/21 1206 08/17/21 0542 08/18/21 0622 08/19/21 0620 08/20/21 0400 08/20/21 0512 08/20/21 1900 08/21/21 0445 08/22/21 0504  WBC 9.4   < > 5.8 4.8 12.2*  --   --  11.2* 10.0  NEUTROABS 6.6  --  3.1  --   --   --   --   --   --   HGB 9.7*   < > 7.6* 8.6* 6.8*   < >  9.6* 9.5* 8.6*  HCT 30.4*   < > 23.8* 27.2* 21.2*   < > 27.8* 28.7* 25.8*  MCV 95.9   < > 100.8* 100.4* 100.5*  --   --  92.3 93.5  PLT 216   < > 134* 192 115*  --   --  102* 82*   < > = values in this interval not displayed.   Cardiac Enzymes: No results for input(s): CKTOTAL, CKMB, CKMBINDEX, TROPONINI in the last 168 hours. CBG: Recent Labs  Lab 08/21/21 0818 08/21/21 1136 08/21/21 1616 08/21/21 2048 08/22/21 0813  GLUCAP 146* 204* 208* 175* 116*    Iron Studies: No results for input(s): IRON, TIBC, TRANSFERRIN, FERRITIN in the last 72 hours. Studies/Results: DG CHEST PORT 1 VIEW  Result Date: 08/21/2021 CLINICAL DATA:  Hypoxemia EXAM: PORTABLE CHEST 1 VIEW COMPARISON:  08/20/2021, 08/19/2021, 03/29/2021 FINDINGS: Right IJ central venous catheter tip over the cavoatrial region. Cardiomegaly. Slight worsening of diffuse bilateral airspace disease. No pneumothorax. Hardware in the cervicothoracic spine. IMPRESSION: 1. Slight interval worsening of diffuse bilateral airspace disease which may be secondary to edema or infection 2. Cardiomegaly Electronically Signed   By: Donavan Foil M.D.   On: 08/21/2021 15:19   DG Chest Port 1 View  Result Date: 08/20/2021 CLINICAL DATA:  Status post dialysis catheter placement. EXAM: PORTABLE CHEST 1 VIEW COMPARISON:  08/19/2021 FINDINGS: Right IJ catheter has been placed with tip at the cavoatrial junction. No pneumothorax  visualized. Unchanged cardiac enlargement. Persistent bilateral nodular and confluent airspace opacities involving the upper and lower lung zones. The appearance is not significantly changed compared with previous exam. No signs of pleural effusion. IMPRESSION: 1. Interval placement of right IJ catheter with tip at the cavoatrial junction. 2. No change in aeration to the lungs compared with previous exam. Electronically Signed   By: Kerby Moors M.D.   On: 08/20/2021 12:57   ECHOCARDIOGRAM COMPLETE  Result Date: 08/21/2021    ECHOCARDIOGRAM REPORT   Patient Name:   Heather [name] Date of Exam: 08/21/2021 Medical Rec #:  [id number]           Height:       64.0 in Accession #:    [id number]          Weight:       166.9 lb Date of Birth:  12-20-1950           BSA:          1.812 m Patient Age:    32 years            BP:           132/90 mmHg Patient Gender: F                   HR:           106 bpm. Exam Location:  [facility] Procedure: 2D Echo, Cardiac Doppler and Color Doppler Indications:    CHF-Acute Diastolic  History:        Patient has prior history of Echocardiogram examinations, most                 recent 05/17/2020. Stroke, Signs/Symptoms:Chest Pain; Risk                 Factors:Diabetes, Hypertension and Dyslipidemia.  Sonographer:    Wenda Low Referring Phys: [id number] [facility]  Sonographer Comments: Image acquisition challenging due to patient behavioral factors. Patient with AMS is constantly moving. IMPRESSIONS  1. Left ventricular ejection fraction, by estimation, is 60 to 65%. The left ventricle has normal function. The left ventricle has no regional wall motion abnormalities. Left ventricular diastolic parameters were normal.  2. Right ventricular systolic function is normal. The right ventricular size is normal. There is mildly elevated pulmonary artery systolic pressure.  3. Left atrial size was mildly dilated.  4. The mitral valve is normal in structure. Mild mitral valve  regurgitation.  5. The aortic valve is tricuspid. Aortic valve regurgitation is not visualized. Mild aortic valve sclerosis is present, with no evidence of aortic valve stenosis.  6. The inferior vena cava is normal in size with greater than 50% respiratory variability, suggesting right atrial pressure of 3 mmHg. Comparison(s): The left ventricular function is unchanged. FINDINGS  Left Ventricle: Left ventricular ejection fraction, by estimation, is 60 to 65%. The left ventricle has normal function. The left ventricle has no regional wall motion abnormalities. The left ventricular internal cavity size was normal in size. There is  no left ventricular hypertrophy. Left ventricular diastolic parameters were normal. Right Ventricle: The right ventricular size is normal. Right vetricular wall thickness was not assessed. Right ventricular systolic function is normal. There is mildly elevated pulmonary artery systolic pressure. The tricuspid regurgitant velocity is 2.70 m/s, and with an assumed right atrial pressure of 8 mmHg, the estimated right ventricular systolic pressure is 66.5 mmHg. Left Atrium: Left atrial size was mildly dilated. Right Atrium: Right atrial size was normal in size. Pericardium: There is no evidence of pericardial effusion. Mitral Valve: The mitral valve is normal in structure. Mild to moderate mitral annular calcification. Mild mitral valve regurgitation. MV peak gradient, 6.1 mmHg. The mean mitral valve gradient is 3.0 mmHg. Tricuspid Valve: The tricuspid valve is normal in structure. Tricuspid valve regurgitation is trivial. Aortic Valve: The aortic valve is tricuspid. Aortic valve regurgitation is not visualized. Mild aortic valve sclerosis is present, with no evidence of aortic valve stenosis. Aortic valve mean gradient measures 6.0 mmHg. Aortic valve peak gradient measures 10.8 mmHg. Aortic valve area, by VTI measures 2.83 cm. Pulmonic Valve: The pulmonic valve was grossly normal. Pulmonic  valve regurgitation is not visualized. Aorta: The aortic root is normal in size and structure. Venous: The inferior vena cava is normal in size with greater than 50% respiratory variability, suggesting right atrial pressure of 3 mmHg. IAS/Shunts: The interatrial septum was not assessed.  LEFT VENTRICLE PLAX 2D LVIDd:         4.60 cm   Diastology LVIDs:         2.90 cm   LV e' medial:    7.72 cm/s LV PW:         1.10 cm   LV E/e' medial:  13.7 LV IVS:        1.00 cm   LV e' lateral:   14.90 cm/s LVOT diam:     2.00 cm   LV E/e' lateral: 7.1 LV SV:         89 LV SV Index:   49 LVOT Area:     3.14 cm  RIGHT VENTRICLE RV Basal diam:  2.60 cm RV Mid diam:    2.40 cm RV S prime:     10.10 cm/s TAPSE (M-mode): 2.3 cm LEFT ATRIUM             Index        RIGHT ATRIUM           Index LA diam:  3.70 cm 2.04 cm/m   RA Area:     15.50 cm LA Vol (A2C):   66.7 ml 36.82 ml/m  RA Volume:   39.00 ml  21.53 ml/m LA Vol (A4C):   52.4 ml 28.93 ml/m LA Biplane Vol: 62.0 ml 34.23 ml/m  AORTIC VALVE                     PULMONIC VALVE AV Area (Vmax):    2.41 cm      PV Vmax:       0.86 m/s AV Area (Vmean):   2.72 cm      PV Peak grad:  2.9 mmHg AV Area (VTI):     2.83 cm AV Vmax:           164.00 cm/s AV Vmean:          108.000 cm/s AV VTI:            0.315 m AV Peak Grad:      10.8 mmHg AV Mean Grad:      6.0 mmHg LVOT Vmax:         126.00 cm/s LVOT Vmean:        93.600 cm/s LVOT VTI:          0.284 m LVOT/AV VTI ratio: 0.90  AORTA Ao Root diam: 2.60 cm MITRAL VALVE                TRICUSPID VALVE MV Area (PHT): 5.20 cm     TR Peak grad:   29.2 mmHg MV Area VTI:   3.21 cm     TR Vmax:        270.00 cm/s MV Peak grad:  6.1 mmHg MV Mean grad:  3.0 mmHg     SHUNTS MV Vmax:       1.23 m/s     Systemic VTI:  0.28 m MV Vmean:      79.6 cm/s    Systemic Diam: 2.00 cm MV Decel Time: 146 msec MV E velocity: 106.00 cm/s MV A velocity: 82.70 cm/s MV E/A ratio:  1.28 Dorris Carnes MD Electronically signed by Dorris Carnes MD Signature  Date/Time: 08/21/2021/4:12:38 PM    Final     allopurinol  100 mg Oral Daily   atorvastatin  80 mg Oral QHS   Chlorhexidine Gluconate Cloth  6 each Topical Daily   citalopram  20 mg Oral Daily   dextromethorphan-guaiFENesin  1 tablet Oral BID   dicyclomine  20 mg Oral TID AC   gabapentin  300 mg Oral BID   heparin injection (subcutaneous)  5,000 Units Subcutaneous Q8H   heparin sodium (porcine)       insulin aspart  0-9 Units Subcutaneous TID WC   insulin detemir  15 Units Subcutaneous QHS   ipratropium-albuterol  3 mL Nebulization Q6H   lipase/protease/amylase  36,000-72,000 Units Oral See admin instructions   metoprolol tartrate  100 mg Oral BID   midodrine  5 mg Oral BID WC   pantoprazole  40 mg Oral Daily   sodium bicarbonate  650 mg Oral TID   venlafaxine XR  150 mg Oral Daily    BMET    Component Value Date/Time   NA 136 08/22/2021 0504   NA 143 06/17/2017 1220   K 3.7 08/22/2021 0504   CL 101 08/22/2021 0504   CO2 24 08/22/2021 0504   GLUCOSE 149 (H) 08/22/2021 0504   BUN 57 (H) 08/22/2021 0504   BUN 34 (H)  06/17/2017 1220   CREATININE 3.71 (H) 08/22/2021 0504   CALCIUM 8.8 (L) 08/22/2021 0504   CALCIUM 9.3 08/02/2021 1033   GFRNONAA 13 (L) 08/22/2021 0504   GFRAA 56 (L) 05/19/2020 0640   CBC    Component Value Date/Time   WBC 10.0 08/22/2021 0504   RBC 2.76 (L) 08/22/2021 0504   HGB 8.6 (L) 08/22/2021 0504   HCT 25.8 (L) 08/22/2021 0504   PLT 82 (L) 08/22/2021 0504   MCV 93.5 08/22/2021 0504   MCH 31.2 08/22/2021 0504   MCHC 33.3 08/22/2021 0504   RDW 16.3 (H) 08/22/2021 0504   LYMPHSABS 2.0 08/18/2021 0622   MONOABS 0.5 08/18/2021 0622   EOSABS 0.1 08/18/2021 0622   BASOSABS 0.0 08/18/2021 0622      Assessment/Plan:   AKI/CKD stage IIIb - in setting of PNA and hypertensive urgency and chronic diarrhea.  Baseline Scr 1.6-2.2 and follows Dr. Theador Hawthorne as an outpatient. Required HD yesterday due to hyperkalemia with UF of 2 liters.  UOP decreased  somewhat overnight.  K stable.  Hold off on HD for now and continue to follow for renal recovery.   Serologies sent and pending (ANA, ANCA) as well as SPEP. Negative HIV, Hep B, and Hep C, normal complements. Negative anti-GBM, + elevated kappa/lambda light chains at 3.27. UOP has fallen and BUN/Cr continue to climb.  Will plan for another session of HD today and UF as tolerated. Acute hypoxic respiratory failure - due to multifocal pneumonia vs CHF.  Currently on Abx per primary.  Had abrupt worsening of hypoxia yesterday despite UF of 2 liters with HD the day before.  Will plan for HD again and UF as tolerated to see if it improves her pulmonary status.  IF she does not improve, this would favor infection over pulmonary edema. HTN urgency/emergency - improved BP control and now with hypotension.   AMS - is awake and alert but remains confused and doesn't remember dialysis yesterday. Hypomagnesemia - likely due to chronic diarrhea. Hyperkalemia - due to AKI, improved with HD 08/20/21. Anemia of critical illness - s/p transfusion.  Continue to follow H/H.   Donetta Potts, MD [employer] [phone]

## 2021-08-23 DIAGNOSIS — N179 Acute kidney failure, unspecified: Secondary | ICD-10-CM | POA: Diagnosis not present

## 2021-08-23 DIAGNOSIS — Z992 Dependence on renal dialysis: Secondary | ICD-10-CM

## 2021-08-23 DIAGNOSIS — K50018 Crohn's disease of small intestine with other complication: Secondary | ICD-10-CM | POA: Diagnosis not present

## 2021-08-23 DIAGNOSIS — H538 Other visual disturbances: Secondary | ICD-10-CM | POA: Diagnosis not present

## 2021-08-23 DIAGNOSIS — I10 Essential (primary) hypertension: Secondary | ICD-10-CM | POA: Diagnosis not present

## 2021-08-23 LAB — DIC (DISSEMINATED INTRAVASCULAR COAGULATION)PANEL
D-Dimer, Quant: 4.82 ug/mL-FEU — ABNORMAL HIGH (ref 0.00–0.50)
Fibrinogen: >800 mg/dL — ABNORMAL HIGH (ref 210–475)
INR: 1.3 — ABNORMAL HIGH (ref 0.8–1.2)
Platelets: 97 10*3/uL — ABNORMAL LOW (ref 150–400)
Prothrombin Time: 15.7 seconds — ABNORMAL HIGH (ref 11.4–15.2)
Smear Review: NONE SEEN
aPTT: 27 seconds (ref 24–36)

## 2021-08-23 LAB — CBC
HCT: 26.6 % — ABNORMAL LOW (ref 36.0–46.0)
Hemoglobin: 8.5 g/dL — ABNORMAL LOW (ref 12.0–15.0)
MCH: 30.2 pg (ref 26.0–34.0)
MCHC: 32 g/dL (ref 30.0–36.0)
MCV: 94.7 fL (ref 80.0–100.0)
Platelets: 94 10*3/uL — ABNORMAL LOW (ref 150–400)
RBC: 2.81 MIL/uL — ABNORMAL LOW (ref 3.87–5.11)
RDW: 16.3 % — ABNORMAL HIGH (ref 11.5–15.5)
WBC: 9.2 10*3/uL (ref 4.0–10.5)
nRBC: 0.2 % (ref 0.0–0.2)

## 2021-08-23 LAB — RENAL FUNCTION PANEL
Albumin: 2.7 g/dL — ABNORMAL LOW (ref 3.5–5.0)
Anion gap: 11 (ref 5–15)
BUN: 40 mg/dL — ABNORMAL HIGH (ref 8–23)
CO2: 27 mmol/L (ref 22–32)
Calcium: 8.3 mg/dL — ABNORMAL LOW (ref 8.9–10.3)
Chloride: 102 mmol/L (ref 98–111)
Creatinine, Ser: 2.81 mg/dL — ABNORMAL HIGH (ref 0.44–1.00)
GFR, Estimated: 18 mL/min — ABNORMAL LOW (ref >60)
Glucose, Bld: 130 mg/dL — ABNORMAL HIGH (ref 70–99)
Phosphorus: 3.5 mg/dL (ref 2.5–4.6)
Potassium: 3.7 mmol/L (ref 3.5–5.1)
Sodium: 140 mmol/L (ref 135–145)

## 2021-08-23 LAB — GLUCOSE, CAPILLARY
Glucose-Capillary: 104 mg/dL — ABNORMAL HIGH (ref 70–99)
Glucose-Capillary: 105 mg/dL — ABNORMAL HIGH (ref 70–99)
Glucose-Capillary: 131 mg/dL — ABNORMAL HIGH (ref 70–99)
Glucose-Capillary: 138 mg/dL — ABNORMAL HIGH (ref 70–99)

## 2021-08-23 LAB — LACTATE DEHYDROGENASE: LDH: 375 U/L — ABNORMAL HIGH (ref 98–192)

## 2021-08-23 LAB — MAGNESIUM: Magnesium: 2 mg/dL (ref 1.7–2.4)

## 2021-08-23 MED ORDER — QUETIAPINE FUMARATE 25 MG PO TABS: 50.0000 mg | ORAL_TABLET | Freq: Every evening | ORAL | Status: DC

## 2021-08-23 MED ORDER — IPRATROPIUM-ALBUTEROL 0.5-2.5 (3) MG/3ML IN SOLN
3.0000 mL | Freq: Two times a day (BID) | RESPIRATORY_TRACT | Status: DC
  Administered 2021-08-24 – 2021-08-26 (×6): 3 mL via RESPIRATORY_TRACT
  Filled 2021-08-23 (×6): qty 3

## 2021-08-23 MED ORDER — CITALOPRAM HYDROBROMIDE 20 MG PO TABS: 20.0000 mg | ORAL_TABLET | Freq: Every day | ORAL | Status: DC

## 2021-08-23 MED ORDER — MUPIROCIN 2 % EX OINT
1.0000 "application " | TOPICAL_OINTMENT | Freq: Two times a day (BID) | CUTANEOUS | Status: AC
  Administered 2021-08-23 – 2021-08-27 (×7): 1 via NASAL
  Filled 2021-08-23 (×2): qty 22

## 2021-08-23 MED ORDER — VENLAFAXINE HCL ER 75 MG PO CP24: 150.0000 mg | ORAL_CAPSULE | Freq: Every day | ORAL | Status: DC

## 2021-08-23 MED ORDER — SODIUM CHLORIDE 0.9% FLUSH
3.0000 mL | Freq: Two times a day (BID) | INTRAVENOUS | Status: DC
  Administered 2021-08-23 – 2021-09-06 (×24): 3 mL via INTRAVENOUS

## 2021-08-23 MED ORDER — LABETALOL HCL 5 MG/ML IV SOLN
0.5000 mg/min | Status: DC
  Administered 2021-08-23: 0.5 mg/min via INTRAVENOUS
  Filled 2021-08-23: qty 80

## 2021-08-23 MED ORDER — SODIUM CHLORIDE 0.9 % IV SOLN
250.0000 mL | INTRAVENOUS | Status: DC | PRN
  Administered 2021-08-26: 250 mL via INTRAVENOUS

## 2021-08-23 MED ORDER — DILTIAZEM HCL 60 MG PO TABS
60.0000 mg | ORAL_TABLET | Freq: Three times a day (TID) | ORAL | Status: DC
  Administered 2021-08-23 – 2021-09-06 (×37): 60 mg via ORAL
  Filled 2021-08-23 (×37): qty 1

## 2021-08-23 MED ORDER — SODIUM CHLORIDE 0.9% FLUSH: 3.0000 mL | INTRAVENOUS | Status: DC | PRN

## 2021-08-23 MED ORDER — SODIUM CHLORIDE 0.9 % IV SOLN
2.0000 g | INTRAVENOUS | Status: AC
  Administered 2021-08-23 – 2021-08-25 (×3): 2 g via INTRAVENOUS
  Filled 2021-08-23 (×3): qty 2

## 2021-08-23 MED ORDER — QUETIAPINE FUMARATE 25 MG PO TABS
50.0000 mg | ORAL_TABLET | Freq: Every day | ORAL | Status: DC
  Administered 2021-08-23: 50 mg via ORAL
  Filled 2021-08-23: qty 2

## 2021-08-23 NOTE — Progress Notes (Addendum)
PROGRESS NOTE    Heather [name]  HFW:[id number] DOB: 12/16/1950 DOA: 08/16/2021 PCP: [name], MD   Brief Narrative:   70 year old female with history of CKD stage IIIb, Crohn's disease and IBS with chronic diarrhea, hypertension, type 2 diabetes, BPPV who presented to the emergency room with blurry vision and diplopia for 2 days along with stabbing headache mostly on the back of her head.  She had urinary symptoms and was treated with doxycycline since last 7 days.  She also developed diffuse rash while taking it.  In the emergency room afebrile.  Heart rate normal.  Blood pressure systolic 027-741.  Patient was given labetalol in the ER and blood pressure improved.  Neurology was consulted.  MRI of the brain and MRA/MRV was done and was essentially normal.  She was also found to be suffering with acute kidney injury on CKDIIIb.  She has required hemodialysis initiation on 10/23 with IJ catheter placement.  2 units PRBCs transfused on 10/23 as well for acute anemia.  She continues to have some ongoing confusion.  Nephrology continues to follow to determine need for long-term hemodialysis.  Assessment & Plan:   Principal Problem:   Blurry vision, bilateral Active Problems:   Essential hypertension   Depression   Acute kidney injury superimposed on CKD (HCC)   Crohn's disease of small intestine with other complication (HCC)   Hypomagnesemia   Acute hypokalemia   Acute hypoxemic respiratory failure-multifactorial with multifocal pneumonia versus acute diastolic CHF exacerbation -Volume management per hemodialysis -Monitor strict I's and O's and daily weights -Procalcitonin borderline, continue current IV antibiotics and changed Vanco to Linezolid 10/26 -2D echocardiogram LVEF 60-65% -VQ scan with no indication for PE -currently on oxygen at 8L, continue to wean down   Acute metabolic encephalopathy likely uremic -Ammonia and TSH within normal limits -Continue close  monitoring -may have a component of hypertensive encephalopathy as well -husband reports that she has been having periods of confusion, worse at night, poor short term memory for roughly six months, implying some possible dementia -will start on seroquel qpm to help with sundowning -will use ativan prn for agitation   AKI on CKD stage IIIb -Appreciate nephrology today with further hemodialysis planned; initiation of urgent hemodialysis on 10/23 -Renal ultrasound with no significant findings -Repeat a.m. labs -Awaiting renal recovery versus need for chronic hemodialysis -overall creatinine trending down -currently on lasix infusion   Acute on chronic anemia-stable -Likely related to CKD -2 unit PRBC transfusion on 10/23 -Monitor for overt bleeding -Recheck CBC in a.m. -Stool occult pending   Hypertensive crisis -currently on oral metoprolol but blood pressures and heart rates remain elevated -will add diltiazem    Acute diplopia and headaches-resolved -Unclear etiology and assessed by neurology -Likely was related to severe hypomagnesemia which has been corrected -Continue to monitor repeat magnesium levels -Muscle relaxers as needed   Non-anion gap metabolic acidosis -Related to diarrhea and possibly AKI -Overall appears to have improved   Type 2 diabetes-controlled -Keep on SSI   Crohn's disease with chronic diarrhea -Humira to resume outpatient -Continue Bentyl   Skin rash -Primary cause is unknown, but started after doxycycline and Diflucan likely drug reaction -Continue to monitor   DVT prophylaxis: Heparin Code Status: DNR, confirmed with husband Family Communication: Discussed with husband over the phone 10/26 Disposition Plan:  Status is: Inpatient   Remains inpatient appropriate because: Patient is currently unstable with hypotension and requires ICU level monitoring as well as IV medications.  Consultants:  Nephrology Neurology   Procedures:   Right IJ hemodialysis catheter placement 10/23  Antimicrobials:  Anti-infectives (From admission, onward)    Start     Dose/Rate Route Frequency Ordered Stop   08/23/21 1000  linezolid (ZYVOX) tablet 600 mg        600 mg Oral Every 12 hours 08/22/21 1038 08/26/21 0959   08/23/21 0900  ceFEPIme (MAXIPIME) 2 g in sodium chloride 0.9 % 100 mL IVPB        2 g 200 mL/hr over 30 Minutes Intravenous Every 24 hours 08/23/21 0736     08/19/21 0745  ceFEPIme (MAXIPIME) 2 g in sodium chloride 0.9 % 100 mL IVPB  Status:  Discontinued        2 g 200 mL/hr over 30 Minutes Intravenous Every 24 hours 08/19/21 0651 08/23/21 0736   08/19/21 0745  vancomycin (VANCOREADY) IVPB 750 mg/150 mL  Status:  Discontinued        750 mg 150 mL/hr over 60 Minutes Intravenous Every 48 hours 08/19/21 0651 08/22/21 1038   08/18/21 0945  fluconazole (DIFLUCAN) tablet 150 mg        150 mg Oral  Once 08/18/21 3557 08/18/21 0951      Subjective: Patient knows she is in the hospital. She denies any complaints at this time. She is aware that she had been receiving dialysis.  Objective: Vitals:   08/23/21 0730 08/23/21 0734 08/23/21 0800 08/23/21 0830  BP: (!) 179/92  (!) 177/75 (!) 196/60  Pulse: (!) 103 98 (!) 103 99  Resp: 14 15 15 16   Temp:  98.9 F (37.2 C) (!) 89.9 F (32.2 C)   TempSrc:  Axillary Axillary   SpO2: 95% 96% 97% 96%  Weight:      Height:        Intake/Output Summary (Last 24 hours) at 08/23/2021 1102 Last data filed at 08/23/2021 0448 Gross per 24 hour  Intake 595.49 ml  Output 2950 ml  Net -2354.51 ml   Filed Weights   08/22/21 1030 08/22/21 1330 08/23/21 0448  Weight: 75 kg 73 kg 70.9 kg    Examination:  General exam: Alert, awake, no distress Respiratory system: Clear to auscultation. Respiratory effort normal. Cardiovascular system:RRR. No murmurs, rubs, gallops. Gastrointestinal system: Abdomen is nondistended, soft and nontender. No organomegaly or masses felt. Normal  bowel sounds heard. Central nervous system: No focal neurological deficits. Extremities: No C/C/E, +pedal pulses Skin: No rashes, lesions or ulcers Psychiatry: confused at times     Data Reviewed: I have personally reviewed following labs and imaging studies  CBC: Recent Labs  Lab 08/16/21 1206 08/17/21 0542 08/18/21 0622 08/19/21 0620 08/20/21 0400 08/20/21 0512 08/20/21 1900 08/21/21 0445 08/22/21 0504 08/23/21 0443  WBC 9.4   < > 5.8 4.8 12.2*  --   --  11.2* 10.0 9.2  NEUTROABS 6.6  --  3.1  --   --   --   --   --   --   --   HGB 9.7*   < > 7.6* 8.6* 6.8* 6.3* 9.6* 9.5* 8.6* 8.5*  HCT 30.4*   < > 23.8* 27.2* 21.2* 20.1* 27.8* 28.7* 25.8* 26.6*  MCV 95.9   < > 100.8* 100.4* 100.5*  --   --  92.3 93.5 94.7  PLT 216   < > 134* 192 115*  --   --  102* 82* 94*   < > = values in this interval not displayed.   Basic Metabolic Panel: Recent  Labs  Lab 08/18/21 0622 08/19/21 0504 08/19/21 0511 08/20/21 0400 08/20/21 1900 08/21/21 0445 08/22/21 0504 08/23/21 0443  NA 135 136 135 134* 134* 135 136 140  K 5.5* 5.3* 5.3* 6.2* 3.4* 4.0 3.7 3.7  CL 107 105 105 106 99 100 101 102  CO2 18* 18* 19* 14* 23 21* 24 27  GLUCOSE 160* 133* 133* 146* 106* 126* 149* 130*  BUN 73* 78* 79* 89* 40* 47* 57* 40*  CREATININE 3.86* 3.93* 3.88* 4.63* 2.88* 3.29* 3.71* 2.81*  CALCIUM 6.4* 7.6* 7.6* 7.2* 8.0* 8.3* 8.8* 8.3*  MG 1.6*  --  1.7 2.1  --  1.9 2.0 2.0  PHOS 5.7* 7.0*  --   --   --   --  5.3* 3.5   GFR: Estimated Creatinine Clearance: 18.3 mL/min (A) (by C-G formula based on SCr of 2.81 mg/dL (H)). Liver Function Tests: Recent Labs  Lab 08/16/21 1206 08/19/21 0504 08/19/21 0511 08/22/21 0504 08/23/21 0443  AST 21  --  22  --   --   ALT 17  --  15  --   --   ALKPHOS 57  --  69  --   --   BILITOT 1.0  --  0.6  --   --   PROT 6.9  --  6.1*  --   --   ALBUMIN 3.4* 3.1* 3.0* 2.4* 2.7*   No results for input(s): LIPASE, AMYLASE in the last 168 hours. Recent Labs  Lab  08/20/21 0803  AMMONIA 30   Coagulation Profile: Recent Labs  Lab 08/20/21 1027  INR 1.6*   Cardiac Enzymes: No results for input(s): CKTOTAL, CKMB, CKMBINDEX, TROPONINI in the last 168 hours. BNP (last 3 results) No results for input(s): PROBNP in the last 8760 hours. HbA1C: No results for input(s): HGBA1C in the last 72 hours. CBG: Recent Labs  Lab 08/22/21 0813 08/22/21 1213 08/22/21 1655 08/22/21 2145 08/23/21 0736  GLUCAP 116* 98 105* 157* 104*   Lipid Profile: No results for input(s): CHOL, HDL, LDLCALC, TRIG, CHOLHDL, LDLDIRECT in the last 72 hours. Thyroid Function Tests: No results for input(s): TSH, T4TOTAL, FREET4, T3FREE, THYROIDAB in the last 72 hours.  Anemia Panel: No results for input(s): VITAMINB12, FOLATE, FERRITIN, TIBC, IRON, RETICCTPCT in the last 72 hours. Sepsis Labs: Recent Labs  Lab 08/19/21 0620  PROCALCITON 0.31    Recent Results (from the past 240 hour(s))  Resp Panel by RT-PCR (Flu A&B, Covid) Nasopharyngeal Swab     Status: None   Collection Time: 08/16/21  8:28 PM   Specimen: Nasopharyngeal Swab; Nasopharyngeal(NP) swabs in vial transport medium  Result Value Ref Range Status   SARS Coronavirus 2 by RT PCR NEGATIVE NEGATIVE Final    Comment: (NOTE) SARS-CoV-2 target nucleic acids are NOT DETECTED.  The SARS-CoV-2 RNA is generally detectable in upper respiratory specimens during the acute phase of infection. The lowest concentration of SARS-CoV-2 viral copies this assay can detect is 138 copies/mL. A negative result does not preclude SARS-Cov-2 infection and should not be used as the sole basis for treatment or other patient management decisions. A negative result may occur with  improper specimen collection/handling, submission of specimen other than nasopharyngeal swab, presence of viral mutation(s) within the areas targeted by this assay, and inadequate number of viral copies(<138 copies/mL). A negative result must be combined  with clinical observations, patient history, and epidemiological information. The expected result is Negative.  Fact Sheet for Patients:  EntrepreneurPulse.com.au  Fact Sheet for Healthcare  Providers:  IncredibleEmployment.be  This test is no t yet approved or cleared by the [place] and  has been authorized for detection and/or diagnosis of SARS-CoV-2 by FDA under an Emergency Use Authorization (EUA). This EUA will remain  in effect (meaning this test can be used) for the duration of the COVID-19 declaration under Section 564(b)(1) of the Act, 21 U.S.C.section 360bbb-3(b)(1), unless the authorization is terminated  or revoked sooner.       Influenza A by PCR NEGATIVE NEGATIVE Final   Influenza B by PCR NEGATIVE NEGATIVE Final    Comment: (NOTE) The Xpert Xpress SARS-CoV-2/FLU/RSV plus assay is intended as an aid in the diagnosis of influenza from Nasopharyngeal swab specimens and should not be used as a sole basis for treatment. Nasal washings and aspirates are unacceptable for Xpert Xpress SARS-CoV-2/FLU/RSV testing.  Fact Sheet for Patients: EntrepreneurPulse.com.au  Fact Sheet for Healthcare Providers: IncredibleEmployment.be  This test is not yet approved or cleared by the [place] FDA and has been authorized for detection and/or diagnosis of SARS-CoV-2 by FDA under an Emergency Use Authorization (EUA). This EUA will remain in effect (meaning this test can be used) for the duration of the COVID-19 declaration under Section 564(b)(1) of the Act, 21 U.S.C. section 360bbb-3(b)(1), unless the authorization is terminated or revoked.  Performed at [facility], [address], [place], [place] [address]   Urine Culture     Status: None   Collection Time: 08/17/21 11:00 AM   Specimen: Urine, Clean Catch  Result Value Ref Range Status   Specimen Description   Final    URINE, CLEAN  CATCH Performed at [facility], [address], [place], [place] [address]    Special Requests   Final    NONE Performed at [facility] [address] [place], [place] [address]    Culture   Final    NO GROWTH Performed at [facility], [place] [address] [place], [place] [address]    Report Status 08/19/2021 FINAL  Final  Culture, blood (Routine X 2) w Reflex to ID Panel     Status: None (Preliminary result)   Collection Time: 08/19/21  7:28 AM   Specimen: BLOOD LEFT ARM  Result Value Ref Range Status   Specimen Description BLOOD LEFT ARM BOTTLES DRAWN AEROBIC AND ANAEROBIC  Final   Special Requests   Final    Blood Culture results may not be optimal due to an excessive volume of blood received in culture bottles   Culture   Final    NO GROWTH 4 DAYS Performed at [facility], [address] [place], [place] [address]    Report Status PENDING  Incomplete  Culture, blood (Routine X 2) w Reflex to ID Panel     Status: None (Preliminary result)   Collection Time: 08/19/21  8:27 AM   Specimen: BLOOD LEFT HAND  Result Value Ref Range Status   Specimen Description   Final    BLOOD LEFT HAND BOTTLES DRAWN AEROBIC AND ANAEROBIC   Special Requests Blood Culture adequate volume  Final   Culture   Final    NO GROWTH 4 DAYS Performed at [facility], [address], [place], [place] [address]    Report Status PENDING  Incomplete  MRSA Next Gen by PCR, Nasal     Status: Abnormal   Collection Time: 08/21/21  5:40 PM   Specimen: Nasal Mucosa; Nasal Swab  Result Value Ref Range Status   MRSA by PCR Next  Gen DETECTED (A) NOT DETECTED Final    Comment: RESULT CALLED TO, READ BACK BY AND VERIFIED WITH: JESSICA HEARN RN,2021,08/21/2021, SELF S. (NOTE) The GeneXpert MRSA Assay (FDA approved for NASAL specimens only), is one component of a comprehensive MRSA colonization surveillance program. It is not intended to diagnose MRSA infection nor to guide or monitor treatment for MRSA  infections. Test performance is not FDA approved in patients less than 41 years old. Performed at [facility], [address], [place], [place] [address]          Radiology Studies: DG CHEST PORT 1 VIEW  Result Date: 08/21/2021 CLINICAL DATA:  Hypoxemia EXAM: PORTABLE CHEST 1 VIEW COMPARISON:  08/20/2021, 08/19/2021, 03/29/2021 FINDINGS: Right IJ central venous catheter tip over the cavoatrial region. Cardiomegaly. Slight worsening of diffuse bilateral airspace disease. No pneumothorax. Hardware in the cervicothoracic spine. IMPRESSION: 1. Slight interval worsening of diffuse bilateral airspace disease which may be secondary to edema or infection 2. Cardiomegaly Electronically Signed   By: Donavan Foil M.D.   On: 08/21/2021 15:19        Scheduled Meds:  allopurinol  100 mg Oral Daily   atorvastatin  80 mg Oral QHS   Chlorhexidine Gluconate Cloth  6 each Topical Daily   [START ON 08/27/2021] citalopram  20 mg Oral Daily   dextromethorphan-guaiFENesin  1 tablet Oral BID   dicyclomine  20 mg Oral TID AC   diltiazem  60 mg Oral Q8H   gabapentin  300 mg Oral BID   heparin injection (subcutaneous)  5,000 Units Subcutaneous Q8H   insulin aspart  0-9 Units Subcutaneous TID WC   insulin detemir  15 Units Subcutaneous QHS   ipratropium-albuterol  3 mL Nebulization Q6H   linezolid  600 mg Oral Q12H   lipase/protease/amylase  36,000-72,000 Units Oral See admin instructions   metoprolol tartrate  100 mg Oral BID   pantoprazole  40 mg Oral Daily   sodium bicarbonate  650 mg Oral TID   [START ON 08/27/2021] venlafaxine XR  150 mg Oral Daily   Continuous Infusions:  sodium chloride     sodium chloride     ceFEPime (MAXIPIME) IV 2 g (08/23/21 1044)   furosemide (LASIX) 200 mg in dextrose 5% 100 mL (2mg /mL) infusion 4 mg/hr (08/22/21 2011)     LOS: 6 days    Time spent: 35 minutes    Kathie Dike, MD [facility]  If 7PM-7AM, please contact  night-coverage [email or web address] 08/23/2021, 11:02 AM

## 2021-08-23 NOTE — Progress Notes (Signed)
Attempted to give PO meds. Patient took 2 pills and then refused to take the rest of her meds. She took Zyvox and protonix. She started yelling saying "you are trying to trick me!" Attempted to reason with patient and provide therapeutic communication. Patient was not cooperative with cares, she started taking her O2 off via [place] and started spitting. MD Memon aware and IV ativan and IV metoprolol administered for patient's agitation and elevated BP. Patient scratching and attempting to bite this RN prior to ativan administration. After receiving ativan, patient appears calm. Resting in bed.

## 2021-08-23 NOTE — Progress Notes (Signed)
Pt resting very well no respiratory distress o2 decreased from 15lpm to10lpm [place] spo2 97% hr 98 rr 15

## 2021-08-23 NOTE — Progress Notes (Signed)
Patient ID: Heather [name], female   DOB: [birth year], 70 y.o.   MRN: [id number] S:Pt very agitated this morning, refusing everything.  Given haldol and remains agitated and confused. O:BP (!) 196/60   Pulse (!) 114   Temp 98.1 F (36.7 C) (Oral)   Resp 18   Ht 5\' 4"  (1.626 m)   Wt 70.9 kg   LMP  (LMP Unknown)   SpO2 98%   BMI 26.83 kg/m   Intake/Output Summary (Last 24 hours) at 08/23/2021 1151 Last data filed at 08/23/2021 0448 Gross per 24 hour  Intake 595.49 ml  Output 2950 ml  Net -2354.51 ml   Intake/Output: I/O last 3 completed shifts: In: 609.7 [P.O.:460; I.V.:49.7; IV Piggyback:100] Out: 3250 [Urine:1250; Other:2000]  Intake/Output this shift:  No intake/output data recorded. Weight change: -0.1 kg JJO:ACZYSAYT and confused KZS:WFUXN at 114 Resp:CTA Abd: +Bs soft, NT/ND Ext:no edema  Recent Labs  Lab 08/16/21 1206 08/17/21 0542 08/18/21 0622 08/19/21 0504 08/19/21 0511 08/20/21 0400 08/20/21 1900 08/21/21 0445 08/22/21 0504 08/23/21 0443  NA 139   < > 135 136 135 134* 134* 135 136 140  K 3.1*   < > 5.5* 5.3* 5.3* 6.2* 3.4* 4.0 3.7 3.7  CL 106   < > 107 105 105 106 99 100 101 102  CO2 21*   < > 18* 18* 19* 14* 23 21* 24 27  GLUCOSE 43*   < > 160* 133* 133* 146* 106* 126* 149* 130*  BUN 56*   < > 73* 78* 79* 89* 40* 47* 57* 40*  CREATININE 3.23*   < > 3.86* 3.93* 3.88* 4.63* 2.88* 3.29* 3.71* 2.81*  ALBUMIN 3.4*  --   --  3.1* 3.0*  --   --   --  2.4* 2.7*  CALCIUM 6.5*   < > 6.4* 7.6* 7.6* 7.2* 8.0* 8.3* 8.8* 8.3*  PHOS  --   --  5.7* 7.0*  --   --   --   --  5.3* 3.5  AST 21  --   --   --  22  --   --   --   --   --   ALT 17  --   --   --  15  --   --   --   --   --    < > = values in this interval not displayed.   Liver Function Tests: Recent Labs  Lab 08/16/21 1206 08/19/21 0504 08/19/21 0511 08/22/21 0504 08/23/21 0443  AST 21  --  22  --   --   ALT 17  --  15  --   --   ALKPHOS 57  --  69  --   --   BILITOT 1.0  --  0.6  --   --    PROT 6.9  --  6.1*  --   --   ALBUMIN 3.4*   < > 3.0* 2.4* 2.7*   < > = values in this interval not displayed.   No results for input(s): LIPASE, AMYLASE in the last 168 hours. Recent Labs  Lab 08/20/21 0803  AMMONIA 30   CBC: Recent Labs  Lab 08/16/21 1206 08/17/21 0542 08/18/21 0622 08/19/21 0620 08/20/21 0400 08/20/21 0512 08/21/21 0445 08/22/21 0504 08/23/21 0443  WBC 9.4   < > 5.8 4.8 12.2*  --  11.2* 10.0 9.2  NEUTROABS 6.6  --  3.1  --   --   --   --   --   --  HGB 9.7*   < > 7.6* 8.6* 6.8*   < > 9.5* 8.6* 8.5*  HCT 30.4*   < > 23.8* 27.2* 21.2*   < > 28.7* 25.8* 26.6*  MCV 95.9   < > 100.8* 100.4* 100.5*  --  92.3 93.5 94.7  PLT 216   < > 134* 192 115*  --  102* 82* 94*   < > = values in this interval not displayed.   Cardiac Enzymes: No results for input(s): CKTOTAL, CKMB, CKMBINDEX, TROPONINI in the last 168 hours. CBG: Recent Labs  Lab 08/22/21 1213 08/22/21 1655 08/22/21 2145 08/23/21 0736 08/23/21 1110  GLUCAP 98 105* 157* 104* 105*    Iron Studies: No results for input(s): IRON, TIBC, TRANSFERRIN, FERRITIN in the last 72 hours. Studies/Results: DG CHEST PORT 1 VIEW  Result Date: 08/21/2021 CLINICAL DATA:  Hypoxemia EXAM: PORTABLE CHEST 1 VIEW COMPARISON:  08/20/2021, 08/19/2021, 03/29/2021 FINDINGS: Right IJ central venous catheter tip over the cavoatrial region. Cardiomegaly. Slight worsening of diffuse bilateral airspace disease. No pneumothorax. Hardware in the cervicothoracic spine. IMPRESSION: 1. Slight interval worsening of diffuse bilateral airspace disease which may be secondary to edema or infection 2. Cardiomegaly Electronically Signed   By: Donavan Foil M.D.   On: 08/21/2021 15:19    allopurinol  100 mg Oral Daily   atorvastatin  80 mg Oral QHS   Chlorhexidine Gluconate Cloth  6 each Topical Daily   [START ON 08/27/2021] citalopram  20 mg Oral Daily   dextromethorphan-guaiFENesin  1 tablet Oral BID   dicyclomine  20 mg Oral TID AC    diltiazem  60 mg Oral Q8H   gabapentin  300 mg Oral BID   heparin injection (subcutaneous)  5,000 Units Subcutaneous Q8H   insulin aspart  0-9 Units Subcutaneous TID WC   insulin detemir  15 Units Subcutaneous QHS   ipratropium-albuterol  3 mL Nebulization Q6H   linezolid  600 mg Oral Q12H   lipase/protease/amylase  36,000-72,000 Units Oral See admin instructions   metoprolol tartrate  100 mg Oral BID   mupirocin ointment  1 application Nasal BID   pantoprazole  40 mg Oral Daily   QUEtiapine  50 mg Oral QPM   sodium bicarbonate  650 mg Oral TID   [START ON 08/27/2021] venlafaxine XR  150 mg Oral Daily    BMET    Component Value Date/Time   NA 140 08/23/2021 0443   NA 143 06/17/2017 1220   K 3.7 08/23/2021 0443   CL 102 08/23/2021 0443   CO2 27 08/23/2021 0443   GLUCOSE 130 (H) 08/23/2021 0443   BUN 40 (H) 08/23/2021 0443   BUN 34 (H) 06/17/2017 1220   CREATININE 2.81 (H) 08/23/2021 0443   CALCIUM 8.3 (L) 08/23/2021 0443   CALCIUM 9.3 08/02/2021 1033   GFRNONAA 18 (L) 08/23/2021 0443   GFRAA 56 (L) 05/19/2020 0640   CBC    Component Value Date/Time   WBC 9.2 08/23/2021 0443   RBC 2.81 (L) 08/23/2021 0443   HGB 8.5 (L) 08/23/2021 0443   HCT 26.6 (L) 08/23/2021 0443   PLT 94 (L) 08/23/2021 0443   MCV 94.7 08/23/2021 0443   MCH 30.2 08/23/2021 0443   MCHC 32.0 08/23/2021 0443   RDW 16.3 (H) 08/23/2021 0443   LYMPHSABS 2.0 08/18/2021 0622   MONOABS 0.5 08/18/2021 0622   EOSABS 0.1 08/18/2021 0622   BASOSABS 0.0 08/18/2021 0622    Assessment/Plan:   AKI/CKD stage IIIb - in setting of PNA  and hypertensive urgency and chronic diarrhea.  Baseline Scr 1.6-2.2 and follows Dr. Theador Hawthorne as an outpatient. Required HD yesterday due to hyperkalemia with UF of 2 liters.  UOP decreased somewhat overnight.  K stable.  Hold off on HD for now and continue to follow for renal recovery.   Serologies sent and pending (ANA, ANCA) as well as SPEP. Negative HIV, Hep B, and Hep C, normal  complements. Negative anti-GBM, + elevated kappa/lambda light chains at 3.27. UOP has picked up and will hold off on HD for now, hopefully starting to have some recovery. Acute hypoxic respiratory failure - due to multifocal pneumonia vs CHF.  Currently on Abx per primary.  Had abrupt worsening of hypoxia yesterday despite UF of 2 liters with HD the day before.  Will plan for HD again and UF as tolerated to see if it improves her pulmonary status.  IF she does not improve, this would favor infection over pulmonary edema. HTN urgency/emergency - improved BP control and now with hypotension.   AMS - is awake and alert but remains confused and doesn't remember dialysis yesterday. Hypomagnesemia - likely due to chronic diarrhea. Hyperkalemia - due to AKI, improved with HD 08/20/21. Anemia of critical illness - s/p transfusion.  Continue to follow H/H.  Thrombocytopenia - will stop heparin and send ADAMTS13 and HIT panel.   Donetta Potts, MD [employer] [phone]

## 2021-08-23 NOTE — Progress Notes (Signed)
Patient is currently on 5L [place] at this time with sats in upper 90s.  No distress noted, will continue to monitor.

## 2021-08-24 DIAGNOSIS — I10 Essential (primary) hypertension: Secondary | ICD-10-CM | POA: Diagnosis not present

## 2021-08-24 DIAGNOSIS — H538 Other visual disturbances: Secondary | ICD-10-CM | POA: Diagnosis not present

## 2021-08-24 DIAGNOSIS — K50018 Crohn's disease of small intestine with other complication: Secondary | ICD-10-CM | POA: Diagnosis not present

## 2021-08-24 DIAGNOSIS — N179 Acute kidney failure, unspecified: Secondary | ICD-10-CM | POA: Diagnosis not present

## 2021-08-24 LAB — BLOOD GAS, ARTERIAL
Acid-Base Excess: 2.3 mmol/L — ABNORMAL HIGH (ref 0.0–2.0)
Bicarbonate: 26.2 mmol/L (ref 20.0–28.0)
Drawn by: 27733
FIO2: 40
O2 Saturation: 96.2 %
Patient temperature: 37.5
pCO2 arterial: 50.2 mmHg — ABNORMAL HIGH (ref 32.0–48.0)
pH, Arterial: 7.356 (ref 7.350–7.450)
pO2, Arterial: 98.3 mmHg (ref 83.0–108.0)

## 2021-08-24 LAB — ADAMTS13 ACTIVITY REFLEX

## 2021-08-24 LAB — RENAL FUNCTION PANEL
Albumin: 2.3 g/dL — ABNORMAL LOW (ref 3.5–5.0)
Anion gap: 11 (ref 5–15)
BUN: 55 mg/dL — ABNORMAL HIGH (ref 8–23)
CO2: 27 mmol/L (ref 22–32)
Calcium: 8.7 mg/dL — ABNORMAL LOW (ref 8.9–10.3)
Chloride: 103 mmol/L (ref 98–111)
Creatinine, Ser: 3.12 mg/dL — ABNORMAL HIGH (ref 0.44–1.00)
GFR, Estimated: 16 mL/min — ABNORMAL LOW (ref >60)
Glucose, Bld: 96 mg/dL (ref 70–99)
Phosphorus: 3.8 mg/dL (ref 2.5–4.6)
Potassium: 3.6 mmol/L (ref 3.5–5.1)
Sodium: 141 mmol/L (ref 135–145)

## 2021-08-24 LAB — CBC
HCT: 25 % — ABNORMAL LOW (ref 36.0–46.0)
Hemoglobin: 7.9 g/dL — ABNORMAL LOW (ref 12.0–15.0)
MCH: 30.5 pg (ref 26.0–34.0)
MCHC: 31.6 g/dL (ref 30.0–36.0)
MCV: 96.5 fL (ref 80.0–100.0)
Platelets: 90 10*3/uL — ABNORMAL LOW (ref 150–400)
RBC: 2.59 MIL/uL — ABNORMAL LOW (ref 3.87–5.11)
RDW: 16.2 % — ABNORMAL HIGH (ref 11.5–15.5)
WBC: 5.2 10*3/uL (ref 4.0–10.5)
nRBC: 0 % (ref 0.0–0.2)

## 2021-08-24 LAB — GLUCOSE, CAPILLARY
Glucose-Capillary: 86 mg/dL (ref 70–99)
Glucose-Capillary: 121 mg/dL — ABNORMAL HIGH (ref 70–99)
Glucose-Capillary: 133 mg/dL — ABNORMAL HIGH (ref 70–99)
Glucose-Capillary: 146 mg/dL — ABNORMAL HIGH (ref 70–99)

## 2021-08-24 LAB — CULTURE, BLOOD (ROUTINE X 2)
Culture: NO GROWTH
Culture: NO GROWTH
Special Requests: ADEQUATE

## 2021-08-24 LAB — AMMONIA: Ammonia: 25 umol/L (ref 9–35)

## 2021-08-24 LAB — HEPARIN INDUCED PLATELET AB (HIT ANTIBODY): Heparin Induced Plt Ab: 0.066 OD (ref 0.000–0.400)

## 2021-08-24 LAB — ADAMTS13 ACTIVITY: Adamts 13 Activity: 40.6 % — ABNORMAL LOW (ref >66.8)

## 2021-08-24 MED ORDER — FUROSEMIDE 10 MG/ML IJ SOLN
INTRAMUSCULAR | Status: AC
  Filled 2021-08-24: qty 20

## 2021-08-24 MED ORDER — QUETIAPINE FUMARATE 25 MG PO TABS
25.0000 mg | ORAL_TABLET | Freq: Every day | ORAL | Status: DC
  Filled 2021-08-24: qty 1

## 2021-08-24 NOTE — Progress Notes (Signed)
Bipap is PRN order.  Patient on 5L [place]. Sat of 97%.  No distress noted at this time.

## 2021-08-24 NOTE — Progress Notes (Signed)
PROGRESS NOTE    Heather [name]  OBS:[id number] DOB: 1951/03/20 DOA: 08/16/2021 PCP: [name], MD   Brief Narrative:   70 year old female with history of CKD stage IIIb, Crohn's disease and IBS with chronic diarrhea, hypertension, type 2 diabetes, BPPV who presented to the emergency room with blurry vision and diplopia for 2 days along with stabbing headache mostly on the back of her head.  She had urinary symptoms and was treated with doxycycline since last 7 days.  She also developed diffuse rash while taking it.  In the emergency room afebrile.  Heart rate normal.  Blood pressure systolic [phone].  Patient was given labetalol in the ER and blood pressure improved.  Neurology was consulted.  MRI of the brain and MRA/MRV was done and was essentially normal.  She was also found to be suffering with acute kidney injury on CKDIIIb.  She has required hemodialysis initiation on 10/23 with IJ catheter placement.  2 units PRBCs transfused on 10/23 as well for acute anemia.  She continues to have some ongoing confusion.  Nephrology continues to follow to determine need for long-term hemodialysis.  Assessment & Plan:   Principal Problem:   Blurry vision, bilateral Active Problems:   Essential hypertension   Depression   Acute kidney injury superimposed on CKD ([place])   Crohn's disease of small intestine with other complication ([place])   Hypomagnesemia   Acute hypokalemia   Acute hypoxemic respiratory failure-multifactorial with multifocal pneumonia versus acute diastolic CHF exacerbation -Volume management per hemodialysis -Monitor strict I's and O's and daily weights -Procalcitonin borderline, continue current IV antibiotics and changed Vanco to Linezolid 10/26, which should be completed on 10/28 -2D echocardiogram LVEF 60-65% -VQ scan with no indication for PE -currently on oxygen at 5L, continue to wean down -Currently on Lasix infusion with fair urine output.  Appears to be having a  negative fluid balance.   Acute metabolic encephalopathy  -Ammonia and TSH within normal limits -may have a component of hypertensive encephalopathy  -husband reports that she had been having periods of confusion, worse at night, poor short term memory for roughly six months, implying some possible dementia -May have component of hospital/ICU delirium -Continue on seroquel qpm to help with sundowning -will use ativan prn for agitation   AKI on CKD stage IIIb -Appreciate nephrology today with further hemodialysis planned; initiation of urgent hemodialysis on 10/23 -Renal ultrasound with no significant findings -Repeat a.m. labs -Awaiting renal recovery versus need for chronic hemodialysis -currently on lasix infusion   Acute on chronic anemia-stable -Likely related to CKD -2 unit PRBC transfusion on 10/23 -Monitor for overt bleeding -Recheck CBC in a.m. -Stool occult pending  Thrombocytopenia -Unclear etiology -HIT panel has been sent -No schistocytes noted on smear making microscopic angiopathy unlikely -DIC panel checked, elevated fibrinogen which would make DIC unlikely -LDH is elevated indicating some degree of hemolysis -We will continue to follow for now   Hypertensive crisis -currently on oral metoprolol but blood pressures and heart rates remain elevated -Since she was refusing oral medications, she was started on labetalol infusion on 10/26.  This has since been discontinued since she is now taking her oral medications.   Acute diplopia and headaches-resolved -Unclear etiology and assessed by neurology -Likely was related to severe hypomagnesemia which has been corrected -Continue to monitor repeat magnesium levels -Muscle relaxers as needed -Difficult to assess at this point, since patient is confused and unable to provide reliable history   Non-anion gap metabolic acidosis -Related to  diarrhea and possibly AKI -Overall appears to have improved   Type 2  diabetes-controlled -Keep on SSI   Crohn's disease with chronic diarrhea -Humira to resume outpatient -Continue Bentyl   Skin rash -Primary cause is unknown, but started after doxycycline and Diflucan, likely drug reaction -Continue to monitor   DVT prophylaxis: Heparin Code Status: DNR, confirmed with husband Family Communication: Discussed with husband over the phone 10/27 Disposition Plan:  Status is: Inpatient   Remains inpatient appropriate because: Patient is currently unstable with hypotension and requires ICU level monitoring as well as IV medications.     Consultants:  Nephrology Neurology   Procedures:  Right IJ hemodialysis catheter placement 10/23  Antimicrobials:  Anti-infectives (From admission, onward)    Start     Dose/Rate Route Frequency Ordered Stop   08/23/21 1000  linezolid (ZYVOX) tablet 600 mg        600 mg Oral Every 12 hours 08/22/21 1038 08/26/21 0959   08/23/21 0900  ceFEPIme (MAXIPIME) 2 g in sodium chloride 0.9 % 100 mL IVPB        2 g 200 mL/hr over 30 Minutes Intravenous Every 24 hours 08/23/21 0736 08/25/21 2359   08/19/21 0745  ceFEPIme (MAXIPIME) 2 g in sodium chloride 0.9 % 100 mL IVPB  Status:  Discontinued        2 g 200 mL/hr over 30 Minutes Intravenous Every 24 hours 08/19/21 0651 08/23/21 0736   08/19/21 0745  vancomycin (VANCOREADY) IVPB 750 mg/150 mL  Status:  Discontinued        750 mg 150 mL/hr over 60 Minutes Intravenous Every 48 hours 08/19/21 0651 08/22/21 1038   08/18/21 0945  fluconazole (DIFLUCAN) tablet 150 mg        150 mg Oral  Once 08/18/21 0853 08/18/21 0951      Subjective: Patient is lethargic today.  Staff reports that she recently received pain medications.  She has been somnolent and confused this morning.  She is currently on 5 L of oxygen  Objective: Vitals:   08/24/21 1300 08/24/21 1400 08/24/21 1500 08/24/21 1626  BP: (!) 156/71 (!) 137/54 (!) 160/73   Pulse: (!) 106 89 93 95  Resp: 14 18 16 16    Temp:    99.5 F (37.5 C)  TempSrc:    Axillary  SpO2: 97% 96% 95% 94%  Weight:      Height:        Intake/Output Summary (Last 24 hours) at 08/24/2021 1645 Last data filed at 08/24/2021 1640 Gross per 24 hour  Intake 771.71 ml  Output 1600 ml  Net -828.29 ml   Filed Weights   08/22/21 1330 08/23/21 0448 08/24/21 0500  Weight: 73 kg 70.9 kg 69.1 kg    Examination:  General exam: somnolent Respiratory system: Clear to auscultation. Respiratory effort normal. Cardiovascular system:RRR. No murmurs, rubs, gallops. Gastrointestinal system: Abdomen is nondistended, soft and nontender. No organomegaly or masses felt. Normal bowel sounds heard. Central nervous system: No focal neurological deficits. Extremities: No C/C/E, +pedal pulses Skin: No rashes, lesions or ulcers Psychiatry: somnolent      Data Reviewed: I have personally reviewed following labs and imaging studies  CBC: Recent Labs  Lab 08/18/21 0622 08/19/21 0620 08/20/21 0400 08/20/21 0512 08/20/21 1900 08/21/21 0445 08/22/21 0504 08/23/21 0443 08/23/21 1223 08/24/21 0430  WBC 5.8   < > 12.2*  --   --  11.2* 10.0 9.2  --  5.2  NEUTROABS 3.1  --   --   --   --   --   --   --   --   --  HGB 7.6*   < > 6.8*   < > 9.6* 9.5* 8.6* 8.5*  --  7.9*  HCT 23.8*   < > 21.2*   < > 27.8* 28.7* 25.8* 26.6*  --  25.0*  MCV 100.8*   < > 100.5*  --   --  92.3 93.5 94.7  --  96.5  PLT 134*   < > 115*  --   --  102* 82* 94* 97* 90*   < > = values in this interval not displayed.   Basic Metabolic Panel: Recent Labs  Lab 08/18/21 0622 08/19/21 0504 08/19/21 0511 08/20/21 0400 08/20/21 1900 08/21/21 0445 08/22/21 0504 08/23/21 0443 08/24/21 0430  NA 135 136 135 134* 134* 135 136 140 141  K 5.5* 5.3* 5.3* 6.2* 3.4* 4.0 3.7 3.7 3.6  CL 107 105 105 106 99 100 101 102 103  CO2 18* 18* 19* 14* 23 21* 24 27 27   GLUCOSE 160* 133* 133* 146* 106* 126* 149* 130* 96  BUN 73* 78* 79* 89* 40* 47* 57* 40* 55*  CREATININE  3.86* 3.93* 3.88* 4.63* 2.88* 3.29* 3.71* 2.81* 3.12*  CALCIUM 6.4* 7.6* 7.6* 7.2* 8.0* 8.3* 8.8* 8.3* 8.7*  MG 1.6*  --  1.7 2.1  --  1.9 2.0 2.0  --   PHOS 5.7* 7.0*  --   --   --   --  5.3* 3.5 3.8   GFR: Estimated Creatinine Clearance: 16.3 mL/min (A) (by C-G formula based on SCr of 3.12 mg/dL (H)). Liver Function Tests: Recent Labs  Lab 08/19/21 0504 08/19/21 0511 08/22/21 0504 08/23/21 0443 08/24/21 0430  AST  --  22  --   --   --   ALT  --  15  --   --   --   ALKPHOS  --  69  --   --   --   BILITOT  --  0.6  --   --   --   PROT  --  6.1*  --   --   --   ALBUMIN 3.1* 3.0* 2.4* 2.7* 2.3*   No results for input(s): LIPASE, AMYLASE in the last 168 hours. Recent Labs  Lab 08/20/21 0803  AMMONIA 30   Coagulation Profile: Recent Labs  Lab 08/20/21 1027 08/23/21 1223  INR 1.6* 1.3*   Cardiac Enzymes: No results for input(s): CKTOTAL, CKMB, CKMBINDEX, TROPONINI in the last 168 hours. BNP (last 3 results) No results for input(s): PROBNP in the last 8760 hours. HbA1C: No results for input(s): HGBA1C in the last 72 hours. CBG: Recent Labs  Lab 08/23/21 1617 08/23/21 2146 08/24/21 0813 08/24/21 1142 08/24/21 1627  GLUCAP 131* 138* 86 121* 133*   Lipid Profile: No results for input(s): CHOL, HDL, LDLCALC, TRIG, CHOLHDL, LDLDIRECT in the last 72 hours. Thyroid Function Tests: No results for input(s): TSH, T4TOTAL, FREET4, T3FREE, THYROIDAB in the last 72 hours.  Anemia Panel: No results for input(s): VITAMINB12, FOLATE, FERRITIN, TIBC, IRON, RETICCTPCT in the last 72 hours. Sepsis Labs: Recent Labs  Lab 08/19/21 0620  PROCALCITON 0.31    Recent Results (from the past 240 hour(s))  Resp Panel by RT-PCR (Flu A&B, Covid) Nasopharyngeal Swab     Status: None   Collection Time: 08/16/21  8:28 PM   Specimen: Nasopharyngeal Swab; Nasopharyngeal(NP) swabs in vial transport medium  Result Value Ref Range Status   SARS Coronavirus 2 by RT PCR NEGATIVE NEGATIVE  Final    Comment: (NOTE) SARS-CoV-2 target nucleic acids are  NOT DETECTED.  The SARS-CoV-2 RNA is generally detectable in upper respiratory specimens during the acute phase of infection. The lowest concentration of SARS-CoV-2 viral copies this assay can detect is 138 copies/mL. A negative result does not preclude SARS-Cov-2 infection and should not be used as the sole basis for treatment or other patient management decisions. A negative result may occur with  improper specimen collection/handling, submission of specimen other than nasopharyngeal swab, presence of viral mutation(s) within the areas targeted by this assay, and inadequate number of viral copies(<138 copies/mL). A negative result must be combined with clinical observations, patient history, and epidemiological information. The expected result is Negative.  Fact Sheet for Patients:  EntrepreneurPulse.com.au  Fact Sheet for Healthcare Providers:  IncredibleEmployment.be  This test is no t yet approved or cleared by the [place] FDA and  has been authorized for detection and/or diagnosis of SARS-CoV-2 by FDA under an Emergency Use Authorization (EUA). This EUA will remain  in effect (meaning this test can be used) for the duration of the COVID-19 declaration under Section 564(b)(1) of the Act, 21 U.S.C.section 360bbb-3(b)(1), unless the authorization is terminated  or revoked sooner.       Influenza A by PCR NEGATIVE NEGATIVE Final   Influenza B by PCR NEGATIVE NEGATIVE Final    Comment: (NOTE) The Xpert Xpress SARS-CoV-2/FLU/RSV plus assay is intended as an aid in the diagnosis of influenza from Nasopharyngeal swab specimens and should not be used as a sole basis for treatment. Nasal washings and aspirates are unacceptable for Xpert Xpress SARS-CoV-2/FLU/RSV testing.  Fact Sheet for Patients: EntrepreneurPulse.com.au  Fact Sheet for Healthcare  Providers: IncredibleEmployment.be  This test is not yet approved or cleared by the [place] FDA and has been authorized for detection and/or diagnosis of SARS-CoV-2 by FDA under an Emergency Use Authorization (EUA). This EUA will remain in effect (meaning this test can be used) for the duration of the COVID-19 declaration under Section 564(b)(1) of the Act, 21 U.S.C. section 360bbb-3(b)(1), unless the authorization is terminated or revoked.  Performed at [facility], [address] [place], [place] [address]   Urine Culture     Status: None   Collection Time: 08/17/21 11:00 AM   Specimen: Urine, Clean Catch  Result Value Ref Range Status   Specimen Description   Final    URINE, CLEAN CATCH Performed at [facility], [address], [place], [place] [address]    Special Requests   Final    NONE Performed at [facility], [address], [place], [place] [address]    Culture   Final    NO GROWTH Performed at [facility], [place] [address], [place], [place] [address]    Report Status 08/19/2021 FINAL  Final  Culture, blood (Routine X 2) w Reflex to ID Panel     Status: None   Collection Time: 08/19/21  7:28 AM   Specimen: BLOOD LEFT ARM  Result Value Ref Range Status   Specimen Description BLOOD LEFT ARM BOTTLES DRAWN AEROBIC AND ANAEROBIC  Final   Special Requests   Final    Blood Culture results may not be optimal due to an excessive volume of blood received in culture bottles   Culture   Final    NO GROWTH 5 DAYS Performed at [facility], [address], [place], [place] [address]    Report Status 08/24/2021 FINAL  Final  Culture, blood (Routine X 2) w Reflex to ID Panel     Status: None  Collection Time: 08/19/21  8:27 AM   Specimen: BLOOD LEFT HAND  Result Value Ref Range Status   Specimen Description   Final    BLOOD LEFT HAND BOTTLES DRAWN AEROBIC AND ANAEROBIC   Special Requests Blood Culture adequate volume  Final   Culture   Final     NO GROWTH 5 DAYS Performed at [facility], [address] [place], [place] [address]    Report Status 08/24/2021 FINAL  Final  MRSA Next Gen by PCR, Nasal     Status: Abnormal   Collection Time: 08/21/21  5:40 PM   Specimen: Nasal Mucosa; Nasal Swab  Result Value Ref Range Status   MRSA by PCR Next Gen DETECTED (A) NOT DETECTED Final    Comment: RESULT CALLED TO, READ BACK BY AND VERIFIED WITH: JESSICA HEARN RN,2021,08/21/2021, SELF S. (NOTE) The GeneXpert MRSA Assay (FDA approved for NASAL specimens only), is one component of a comprehensive MRSA colonization surveillance program. It is not intended to diagnose MRSA infection nor to guide or monitor treatment for MRSA infections. Test performance is not FDA approved in patients less than 69 years old. Performed at [facility], [address], [place], [place] [address]          Radiology Studies: No results found.      Scheduled Meds:  allopurinol  100 mg Oral Daily   atorvastatin  80 mg Oral QHS   Chlorhexidine Gluconate Cloth  6 each Topical Daily   [START ON 08/27/2021] citalopram  20 mg Oral Daily   dextromethorphan-guaiFENesin  1 tablet Oral BID   dicyclomine  20 mg Oral TID AC   diltiazem  60 mg Oral Q8H   gabapentin  300 mg Oral BID   insulin aspart  0-9 Units Subcutaneous TID WC   insulin detemir  15 Units Subcutaneous QHS   ipratropium-albuterol  3 mL Nebulization BID   linezolid  600 mg Oral Q12H   lipase/protease/amylase  36,000-72,000 Units Oral See admin instructions   metoprolol tartrate  100 mg Oral BID   mupirocin ointment  1 application Nasal BID   pantoprazole  40 mg Oral Daily   QUEtiapine  25 mg Oral QHS   sodium bicarbonate  650 mg Oral TID   sodium chloride flush  3 mL Intravenous Q12H   [START ON 08/27/2021] venlafaxine XR  150 mg Oral Daily   Continuous Infusions:  sodium chloride     sodium chloride     sodium chloride     ceFEPime (MAXIPIME) IV Stopped (08/24/21 1035)    furosemide (LASIX) 200 mg in dextrose 5% 100 mL (2mg /mL) infusion 4 mg/hr (08/24/21 1640)   labetalol (NORMODYNE) infusion 5 mg/mL Stopped (08/23/21 2205)     LOS: 7 days    Time spent: 55 minutes    Kathie Dike, MD [facility]  If 7PM-7AM, please contact night-coverage [email or web address] 08/24/2021, 4:45 PM

## 2021-08-24 NOTE — Progress Notes (Signed)
Initial Nutrition Assessment  DOCUMENTATION CODES:      INTERVENTION:  Recommend placement of NGT and provide enteral nutrition given pt ongoing minimal intake.  Initiate Vital 1.2 @ 25 ml/hr via NGT and increase by 10 ml every 8 hours to goal rate of 60 ml/hr.   Tube feeding regimen provides 1728 kcal, 108 grams of protein, and 1168 ml of H2O.    Monitor magnesium, potassium, and phosphorus BID for at least 3 days, MD to replete as needed, as pt is at risk for refeeding syndrome given prolonged poor intake.   Recommend consider adding Rena-Vite  NUTRITION DIAGNOSIS:   Inadequate oral intake related to lethargy/confusion as evidenced by energy intake < or equal to 50% for > or equal to 5 days.   GOAL:  Provide needs based on ASPEN/SCCM guidelines  MONITOR:  TF tolerance  REASON FOR ASSESSMENT:   Malnutrition Screening Tool   ASSESSMENT: Patient has a hx of DM2, GERD, acute on chronic Anemia (2 units PRBC), B-12 deficiency, Chron's, acute on chronic CKD3 b. Nephrology following- there is discussion around pt potential need for long term HD.  10/25 Net UF 2 liters Post HD wt 73 kg.  LOS-day 7. Patient is in laying sideways in bed. Patient minimally responsive to RD. Talked with nursing who says pt has eaten very little. She has been refusing meals, agitated, fighting staff when trying to assist with care.  Medications reviewed and include:  insulin, creon (36,000-72,000 units), protonix, sodium bicarb.  Labs: BMP Latest Ref Rng & Units 08/24/2021 08/23/2021 08/22/2021  Glucose 70 - 99 mg/dL 96 130(H) 149(H)  BUN 8 - 23 mg/dL 55(H) 40(H) 57(H)  Creatinine 0.44 - 1.00 mg/dL 3.12(H) 2.81(H) 3.71(H)  BUN/Creat Ratio 12 - 28 - - -  Sodium 135 - 145 mmol/L 141 140 136  Potassium 3.5 - 5.1 mmol/L 3.6 3.7 3.7  Chloride 98 - 111 mmol/L 103 102 101  CO2 22 - 32 mmol/L 27 27 24   Calcium 8.9 - 10.3 mg/dL 8.7(L) 8.3(L) 8.8(L)      NUTRITION - FOCUSED PHYSICAL EXAM: Unable to  complete Nutrition-Focused physical exam at this time.  Patient at high risk for Malnutrition due to her limited oral intake and increased protein/energy needs with HD.  Diet Order:   Diet Order             Diet heart healthy/carb modified Room service appropriate? Yes; Fluid consistency: Thin  Diet effective now                   EDUCATION NEEDS:  Not appropriate for education at this time  Skin:  Skin Assessment: Reviewed RN Assessment  Last BM:  10/25  Height:   Ht Readings from Last 1 Encounters:  08/19/21 5\' 4"  (1.626 m)    Weight:   Wt Readings from Last 1 Encounters:  08/24/21 69.1 kg    Ideal Body Weight:   55 kg  BMI:  Body mass index is 26.15 kg/m.  Estimated Nutritional Needs:   Kcal:  1800-1900  Protein:  89-95 gr  Fluid:  per MD goal   Colman Cater MS,RD,CSG,LDN Contact: Shea Evans

## 2021-08-24 NOTE — Progress Notes (Signed)
[facility] ROUNDING NOTE   Subjective:   Interval History: 70 y/o lady admitted 10/19 with symptomatic hypertension and acute on chronic renal failure requiring dialysis x 2 last 10/25  She was also found to be in acute hypoxic respiratory failure. EF 65 % . Thought to have element of volume overload.  Wt down about 20 lbs since admission. metabolic encephalopathy with negative MRI  ammonia and TSH   Renal u/s neg for hydro . Urine   diuresing with lasix   Urine output 2.1 L  10/26   BP 150 / 60   P 105  sats 96 %  5 L HFNC  Na 141 K 3.6   Cr 3.12     Hb 7.9      Allopurinol,  lipitor  celexa  diltiazem  neurontin  insulin  atrovent , linezolid  , protonix , sodium bicarbonate  effexor    IV Cefipime  IV lasix drip  IV labetalol       Objective:  Vital signs in last 24 hours:  Temp:  [97.5 F (36.4 C)-100.2 F (37.9 C)] 98.5 F (36.9 C) (10/27 0816) Pulse Rate:  [68-127] 88 (10/27 0816) Resp:  [13-32] 32 (10/27 0816) BP: (113-209)/(43-153) 153/50 (10/27 0600) SpO2:  [91 %-99 %] 99 % (10/27 0816) Weight:  [69.1 kg] 69.1 kg (10/27 0500)  Weight change: -5.9 kg Filed Weights   08/22/21 1330 08/23/21 0448 08/24/21 0500  Weight: 73 kg 70.9 kg 69.1 kg    Intake/Output: I/O last 3 completed shifts: In: 626.9 [P.O.:390; I.V.:136.9; IV Piggyback:100] Out: 3000 [Urine:3000]   Intake/Output this shift:  Total I/O In: 300 [Other:300] Out: -   Non distressed CVS- RRR no murmurs  RS- CTA ABD- BS present soft non-distended EXT- no edema   Basic Metabolic Panel: Recent Labs  Lab 08/18/21 0622 08/19/21 0504 08/19/21 0511 08/20/21 0400 08/20/21 1900 08/21/21 0445 08/22/21 0504 08/23/21 0443 08/24/21 0430  NA 135 136 135 134* 134* 135 136 140 141  K 5.5* 5.3* 5.3* 6.2* 3.4* 4.0 3.7 3.7 3.6  CL 107 105 105 106 99 100 101 102 103  CO2 18* 18* 19* 14* 23 21* 24 27 27   GLUCOSE 160* 133* 133* 146* 106* 126* 149* 130* 96  BUN 73* 78* 79* 89* 40* 47*  57* 40* 55*  CREATININE 3.86* 3.93* 3.88* 4.63* 2.88* 3.29* 3.71* 2.81* 3.12*  CALCIUM 6.4* 7.6* 7.6* 7.2* 8.0* 8.3* 8.8* 8.3* 8.7*  MG 1.6*  --  1.7 2.1  --  1.9 2.0 2.0  --   PHOS 5.7* 7.0*  --   --   --   --  5.3* 3.5 3.8    Liver Function Tests: Recent Labs  Lab 08/19/21 0504 08/19/21 0511 08/22/21 0504 08/23/21 0443 08/24/21 0430  AST  --  22  --   --   --   ALT  --  15  --   --   --   ALKPHOS  --  69  --   --   --   BILITOT  --  0.6  --   --   --   PROT  --  6.1*  --   --   --   ALBUMIN 3.1* 3.0* 2.4* 2.7* 2.3*   No results for input(s): LIPASE, AMYLASE in the last 168 hours. Recent Labs  Lab 08/20/21 0803  AMMONIA 30    CBC: Recent Labs  Lab 08/18/21 0622 08/19/21 0620 08/20/21 0400 08/20/21 0512 08/20/21 1900 08/21/21  1443 08/22/21 0504 08/23/21 0443 08/23/21 1223 08/24/21 0430  WBC 5.8   < > 12.2*  --   --  11.2* 10.0 9.2  --  5.2  NEUTROABS 3.1  --   --   --   --   --   --   --   --   --   HGB 7.6*   < > 6.8*   < > 9.6* 9.5* 8.6* 8.5*  --  7.9*  HCT 23.8*   < > 21.2*   < > 27.8* 28.7* 25.8* 26.6*  --  25.0*  MCV 100.8*   < > 100.5*  --   --  92.3 93.5 94.7  --  96.5  PLT 134*   < > 115*  --   --  102* 82* 94* 97* 90*   < > = values in this interval not displayed.    Cardiac Enzymes: No results for input(s): CKTOTAL, CKMB, CKMBINDEX, TROPONINI in the last 168 hours.  BNP: Invalid input(s): POCBNP  CBG: Recent Labs  Lab 08/23/21 0736 08/23/21 1110 08/23/21 1617 08/23/21 2146 08/24/21 0813  GLUCAP 104* 105* 131* 138* 69    Microbiology: Results for orders placed or performed during the hospital encounter of 08/16/21  Resp Panel by RT-PCR (Flu A&B, Covid) Nasopharyngeal Swab     Status: None   Collection Time: 08/16/21  8:28 PM   Specimen: Nasopharyngeal Swab; Nasopharyngeal(NP) swabs in vial transport medium  Result Value Ref Range Status   SARS Coronavirus 2 by RT PCR NEGATIVE NEGATIVE Final    Comment: (NOTE) SARS-CoV-2 target  nucleic acids are NOT DETECTED.  The SARS-CoV-2 RNA is generally detectable in upper respiratory specimens during the acute phase of infection. The lowest concentration of SARS-CoV-2 viral copies this assay can detect is 138 copies/mL. A negative result does not preclude SARS-Cov-2 infection and should not be used as the sole basis for treatment or other patient management decisions. A negative result may occur with  improper specimen collection/handling, submission of specimen other than nasopharyngeal swab, presence of viral mutation(s) within the areas targeted by this assay, and inadequate number of viral copies(<138 copies/mL). A negative result must be combined with clinical observations, patient history, and epidemiological information. The expected result is Negative.  Fact Sheet for Patients:  EntrepreneurPulse.com.au  Fact Sheet for Healthcare Providers:  IncredibleEmployment.be  This test is no t yet approved or cleared by the [place] FDA and  has been authorized for detection and/or diagnosis of SARS-CoV-2 by FDA under an Emergency Use Authorization (EUA). This EUA will remain  in effect (meaning this test can be used) for the duration of the COVID-19 declaration under Section 564(b)(1) of the Act, 21 U.S.C.section 360bbb-3(b)(1), unless the authorization is terminated  or revoked sooner.       Influenza A by PCR NEGATIVE NEGATIVE Final   Influenza B by PCR NEGATIVE NEGATIVE Final    Comment: (NOTE) The Xpert Xpress SARS-CoV-2/FLU/RSV plus assay is intended as an aid in the diagnosis of influenza from Nasopharyngeal swab specimens and should not be used as a sole basis for treatment. Nasal washings and aspirates are unacceptable for Xpert Xpress SARS-CoV-2/FLU/RSV testing.  Fact Sheet for Patients: EntrepreneurPulse.com.au  Fact Sheet for Healthcare  Providers: IncredibleEmployment.be  This test is not yet approved or cleared by the [place] FDA and has been authorized for detection and/or diagnosis of SARS-CoV-2 by FDA under an Emergency Use Authorization (EUA). This EUA will remain in effect (meaning this test can be  used) for the duration of the COVID-19 declaration under Section 564(b)(1) of the Act, 21 U.S.C. section 360bbb-3(b)(1), unless the authorization is terminated or revoked.  Performed at [facility], [address], [place], [place] [address]   Urine Culture     Status: None   Collection Time: 08/17/21 11:00 AM   Specimen: Urine, Clean Catch  Result Value Ref Range Status   Specimen Description   Final    URINE, CLEAN CATCH Performed at [facility], [address] [place], [place] [address]    Special Requests   Final    NONE Performed at [facility], [address] [place], [place] [address]    Culture   Final    NO GROWTH Performed at [facility], [place] [address] [place], [place] [address]    Report Status 08/19/2021 FINAL  Final  Culture, blood (Routine X 2) w Reflex to ID Panel     Status: None   Collection Time: 08/19/21  7:28 AM   Specimen: BLOOD LEFT ARM  Result Value Ref Range Status   Specimen Description BLOOD LEFT ARM BOTTLES DRAWN AEROBIC AND ANAEROBIC  Final   Special Requests   Final    Blood Culture results may not be optimal due to an excessive volume of blood received in culture bottles   Culture   Final    NO GROWTH 5 DAYS Performed at [facility], [address] [place], [place] [address]    Report Status 08/24/2021 FINAL  Final  Culture, blood (Routine X 2) w Reflex to ID Panel     Status: None   Collection Time: 08/19/21  8:27 AM   Specimen: BLOOD LEFT HAND  Result Value Ref Range Status   Specimen Description   Final    BLOOD LEFT HAND BOTTLES DRAWN AEROBIC AND ANAEROBIC   Special Requests Blood Culture adequate volume  Final   Culture   Final     NO GROWTH 5 DAYS Performed at [facility], [address], [place], [place] [address]    Report Status 08/24/2021 FINAL  Final  MRSA Next Gen by PCR, Nasal     Status: Abnormal   Collection Time: 08/21/21  5:40 PM   Specimen: Nasal Mucosa; Nasal Swab  Result Value Ref Range Status   MRSA by PCR Next Gen DETECTED (A) NOT DETECTED Final    Comment: RESULT CALLED TO, READ BACK BY AND VERIFIED WITH: JESSICA HEARN RN,2021,08/21/2021, SELF S. (NOTE) The GeneXpert MRSA Assay (FDA approved for NASAL specimens only), is one component of a comprehensive MRSA colonization surveillance program. It is not intended to diagnose MRSA infection nor to guide or monitor treatment for MRSA infections. Test performance is not FDA approved in patients less than 88 years old. Performed at [facility], [address] [place], [place] [address]     Coagulation Studies: Recent Labs    08/23/21 1223  LABPROT 15.7*  INR 1.3*    Urinalysis: No results for input(s): COLORURINE, LABSPEC, PHURINE, GLUCOSEU, HGBUR, BILIRUBINUR, KETONESUR, PROTEINUR, UROBILINOGEN, NITRITE, LEUKOCYTESUR in the last 72 hours.  Invalid input(s): APPERANCEUR    Imaging: No results found.   Medications:    sodium chloride     sodium chloride     sodium chloride     ceFEPime (MAXIPIME) IV 2 g (08/24/21 0945)   furosemide (LASIX) 200 mg in dextrose 5% 100 mL (2mg /mL) infusion 4 mg/hr (08/23/21 1700)   labetalol (NORMODYNE) infusion 5 mg/mL Stopped (08/23/21 2205)    allopurinol  100 mg Oral Daily   atorvastatin  80 mg Oral QHS   Chlorhexidine Gluconate Cloth  6 each Topical Daily   [START ON 08/27/2021] citalopram  20 mg Oral Daily   dextromethorphan-guaiFENesin  1 tablet Oral BID   dicyclomine  20 mg Oral TID AC   diltiazem  60 mg Oral Q8H   gabapentin  300 mg Oral BID   insulin aspart  0-9 Units Subcutaneous TID WC   insulin detemir  15 Units Subcutaneous QHS   ipratropium-albuterol  3 mL Nebulization BID    linezolid  600 mg Oral Q12H   lipase/protease/amylase  36,000-72,000 Units Oral See admin instructions   metoprolol tartrate  100 mg Oral BID   mupirocin ointment  1 application Nasal BID   pantoprazole  40 mg Oral Daily   QUEtiapine  50 mg Oral QHS   sodium bicarbonate  650 mg Oral TID   sodium chloride flush  3 mL Intravenous Q12H   [START ON 08/27/2021] venlafaxine XR  150 mg Oral Daily   sodium chloride, sodium chloride, sodium chloride, diphenhydrAMINE, diphenoxylate-atropine, haloperidol lactate, HYDROmorphone (DILAUDID) injection, HYDROmorphone, ipratropium-albuterol, LORazepam, meclizine, methocarbamol, metoprolol tartrate, ondansetron **OR** ondansetron (ZOFRAN) IV, sodium chloride flush, temazepam  Assessment/ Plan:  AKI/CKD stage IIIb - in setting of PNA and hypertensive urgency and chronic diarrhea.  Baseline Scr variable 1.6-2.2 and follows Dr. Theador Hawthorne as an outpatient. Required HD yesterday due to hyperkalemia with UF of 2 liters.    Hold off on HD for now and continue to follow for renal recovery.   Serologies negative   high K/L light chains noted  UOP has picked up with impending recovery Acute hypoxic respiratory failure - due to multifocal pneumonia vs CHF.  Currently on Abx per primary.   last dialysis 10/25 with 2 L removed. Negative balance noted . V/Q negative   HTN urgency/emergency -  will hopefully titrate off IV labetaolol   AMS - is awake and alert but remains confused and doesn't remember dialysis yesterday. Hypomagnesemia - likely due to chronic diarrhea. Hyperkalemia - due to AKI, improved with HD 08/20/21. Anemia of critical illness - s/p transfusion.  Continue to follow H/H.  Thrombocytopenia - no schistocytes on smear  doubt microscopic angiopathy    LOS: 7 Sherril Croon @TODAY @11 :38 AM

## 2021-08-24 NOTE — Progress Notes (Signed)
**Note De-identified  Obfuscation** ABG collected and delivered to lab ?

## 2021-08-24 NOTE — Plan of Care (Signed)

## 2021-08-25 ENCOUNTER — Inpatient Hospital Stay (HOSPITAL_COMMUNITY): Payer: 59

## 2021-08-25 DIAGNOSIS — H538 Other visual disturbances: Secondary | ICD-10-CM | POA: Diagnosis not present

## 2021-08-25 DIAGNOSIS — I10 Essential (primary) hypertension: Secondary | ICD-10-CM | POA: Diagnosis not present

## 2021-08-25 DIAGNOSIS — J9601 Acute respiratory failure with hypoxia: Secondary | ICD-10-CM | POA: Diagnosis present

## 2021-08-25 DIAGNOSIS — G9341 Metabolic encephalopathy: Secondary | ICD-10-CM | POA: Diagnosis present

## 2021-08-25 DIAGNOSIS — K50018 Crohn's disease of small intestine with other complication: Secondary | ICD-10-CM | POA: Diagnosis not present

## 2021-08-25 DIAGNOSIS — N189 Chronic kidney disease, unspecified: Secondary | ICD-10-CM | POA: Diagnosis present

## 2021-08-25 DIAGNOSIS — D696 Thrombocytopenia, unspecified: Secondary | ICD-10-CM | POA: Diagnosis present

## 2021-08-25 DIAGNOSIS — D631 Anemia in chronic kidney disease: Secondary | ICD-10-CM | POA: Diagnosis present

## 2021-08-25 DIAGNOSIS — N179 Acute kidney failure, unspecified: Secondary | ICD-10-CM | POA: Diagnosis not present

## 2021-08-25 LAB — CBC
HCT: 25.7 % — ABNORMAL LOW (ref 36.0–46.0)
Hemoglobin: 8.1 g/dL — ABNORMAL LOW (ref 12.0–15.0)
MCH: 30.2 pg (ref 26.0–34.0)
MCHC: 31.5 g/dL (ref 30.0–36.0)
MCV: 95.9 fL (ref 80.0–100.0)
Platelets: 114 10*3/uL — ABNORMAL LOW (ref 150–400)
RBC: 2.68 MIL/uL — ABNORMAL LOW (ref 3.87–5.11)
RDW: 15.9 % — ABNORMAL HIGH (ref 11.5–15.5)
WBC: 5.4 10*3/uL (ref 4.0–10.5)
nRBC: 0 % (ref 0.0–0.2)

## 2021-08-25 LAB — RENAL FUNCTION PANEL
Albumin: 2.4 g/dL — ABNORMAL LOW (ref 3.5–5.0)
Anion gap: 10 (ref 5–15)
BUN: 72 mg/dL — ABNORMAL HIGH (ref 8–23)
CO2: 29 mmol/L (ref 22–32)
Calcium: 8.8 mg/dL — ABNORMAL LOW (ref 8.9–10.3)
Chloride: 104 mmol/L (ref 98–111)
Creatinine, Ser: 3.65 mg/dL — ABNORMAL HIGH (ref 0.44–1.00)
GFR, Estimated: 13 mL/min — ABNORMAL LOW (ref >60)
Glucose, Bld: 146 mg/dL — ABNORMAL HIGH (ref 70–99)
Phosphorus: 4.8 mg/dL — ABNORMAL HIGH (ref 2.5–4.6)
Potassium: 3.8 mmol/L (ref 3.5–5.1)
Sodium: 143 mmol/L (ref 135–145)

## 2021-08-25 LAB — GLUCOSE, CAPILLARY
Glucose-Capillary: 133 mg/dL — ABNORMAL HIGH (ref 70–99)
Glucose-Capillary: 149 mg/dL — ABNORMAL HIGH (ref 70–99)
Glucose-Capillary: 192 mg/dL — ABNORMAL HIGH (ref 70–99)
Glucose-Capillary: 160 mg/dL — ABNORMAL HIGH (ref 70–99)

## 2021-08-25 LAB — IMMUNOFIXATION, URINE

## 2021-08-25 LAB — SEDIMENTATION RATE: Sed Rate: 127 mm/hr — ABNORMAL HIGH (ref 0–22)

## 2021-08-25 MED ORDER — LORAZEPAM 2 MG/ML IJ SOLN
1.0000 mg | INTRAMUSCULAR | Status: DC | PRN
  Administered 2021-08-26 – 2021-08-29 (×7): 1 mg via INTRAVENOUS
  Filled 2021-08-25 (×8): qty 1

## 2021-08-25 MED ORDER — LABETALOL HCL 200 MG PO TABS
200.0000 mg | ORAL_TABLET | Freq: Two times a day (BID) | ORAL | Status: DC
  Administered 2021-08-25 – 2021-09-06 (×23): 200 mg via ORAL
  Filled 2021-08-25 (×25): qty 1

## 2021-08-25 MED ORDER — HYDRALAZINE HCL 20 MG/ML IJ SOLN
5.0000 mg | INTRAMUSCULAR | Status: DC | PRN
  Administered 2021-08-25: 5 mg via INTRAVENOUS
  Filled 2021-08-25 (×2): qty 1

## 2021-08-25 MED ORDER — VENLAFAXINE HCL ER 75 MG PO CP24
75.0000 mg | ORAL_CAPSULE | Freq: Every day | ORAL | Status: AC
  Administered 2021-08-26 – 2021-08-29 (×3): 75 mg via ORAL
  Filled 2021-08-25 (×4): qty 1

## 2021-08-25 MED ORDER — SODIUM CHLORIDE 0.9 % IV SOLN: 2.0000 g | INTRAVENOUS | Status: DC

## 2021-08-25 MED ORDER — ACETAMINOPHEN 325 MG PO TABS
650.0000 mg | ORAL_TABLET | Freq: Four times a day (QID) | ORAL | Status: DC | PRN
  Administered 2021-08-25 – 2021-08-30 (×5): 650 mg via ORAL
  Filled 2021-08-25 (×5): qty 2

## 2021-08-25 NOTE — Progress Notes (Addendum)
[facility] ROUNDING NOTE   Subjective:   Interval History: Interval History: 70 y/o lady admitted 10/19 with symptomatic hypertension and acute on chronic renal failure requiring dialysis x 2 last 10/25  She was also found to be in acute hypoxic respiratory failure. EF 65 % . Thought to have element of volume overload.  Wt down about 20 lbs since admission. metabolic encephalopathy with negative MRI  ammonia and TSH    Renal u/s neg for hydro . Urine   diuresing with lasix   Urine output 900 cc 08/24/2021     blood pressure 207/74 pulse 100 temperature 90  O2 sats 96% 5 L high flow nasal cannula  Sodium 143 potassium 3.8 chloride 104 CO2 29 BUN 70 creatinine 3.65 glucose 146 calcium 8.8 phosphorus 4.8  Objective:  Vital signs in last 24 hours:  Temp:  [98 F (36.7 C)-99.5 F (37.5 C)] 99 F (37.2 C) (10/28 0700) Pulse Rate:  [87-107] 91 (10/28 0400) Resp:  [11-34] 19 (10/28 0400) BP: (95-199)/(45-91) 145/57 (10/28 0400) SpO2:  [93 %-99 %] 97 % (10/28 0753) Weight:  [66.9 kg] 66.9 kg (10/28 0459)  Weight change: -2.2 kg Filed Weights   08/23/21 0448 08/24/21 0500 08/25/21 0459  Weight: 70.9 kg 69.1 kg 66.9 kg    Intake/Output: I/O last 3 completed shifts: In: 634.8 [P.O.:150; I.V.:84.8; Other:300; IV Piggyback:100] Out: 2375 [Urine:2375]   Intake/Output this shift:  No intake/output data recorded.  CVS- RRR RS- CTA ABD- BS present soft non-distended EXT- no edema   Basic Metabolic Panel: Recent Labs  Lab 08/19/21 0504 08/19/21 0511 08/20/21 0400 08/20/21 1900 08/21/21 0445 08/22/21 0504 08/23/21 0443 08/24/21 0430 08/25/21 0407  NA 136 135 134*   < > 135 136 140 141 143  K 5.3* 5.3* 6.2*   < > 4.0 3.7 3.7 3.6 3.8  CL 105 105 106   < > 100 101 102 103 104  CO2 18* 19* 14*   < > 21* 24 27 27 29   GLUCOSE 133* 133* 146*   < > 126* 149* 130* 96 146*  BUN 78* 79* 89*   < > 47* 57* 40* 55* 72*  CREATININE 3.93* 3.88* 4.63*   < > 3.29* 3.71*  2.81* 3.12* 3.65*  CALCIUM 7.6* 7.6* 7.2*   < > 8.3* 8.8* 8.3* 8.7* 8.8*  MG  --  1.7 2.1  --  1.9 2.0 2.0  --   --   PHOS 7.0*  --   --   --   --  5.3* 3.5 3.8 4.8*   < > = values in this interval not displayed.    Liver Function Tests: Recent Labs  Lab 08/19/21 0511 08/22/21 0504 08/23/21 0443 08/24/21 0430 08/25/21 0407  AST 22  --   --   --   --   ALT 15  --   --   --   --   ALKPHOS 69  --   --   --   --   BILITOT 0.6  --   --   --   --   PROT 6.1*  --   --   --   --   ALBUMIN 3.0* 2.4* 2.7* 2.3* 2.4*   No results for input(s): LIPASE, AMYLASE in the last 168 hours. Recent Labs  Lab 08/20/21 0803 08/24/21 1718  AMMONIA 30 25    CBC: Recent Labs  Lab 08/21/21 0445 08/22/21 0504 08/23/21 0443 08/23/21 1223 08/24/21 0430 08/25/21 0407  WBC 11.2*  10.0 9.2  --  5.2 5.4  HGB 9.5* 8.6* 8.5*  --  7.9* 8.1*  HCT 28.7* 25.8* 26.6*  --  25.0* 25.7*  MCV 92.3 93.5 94.7  --  96.5 95.9  PLT 102* 82* 94* 97* 90* 114*    Cardiac Enzymes: No results for input(s): CKTOTAL, CKMB, CKMBINDEX, TROPONINI in the last 168 hours.  BNP: Invalid input(s): POCBNP  CBG: Recent Labs  Lab 08/24/21 0813 08/24/21 1142 08/24/21 1627 08/24/21 2129 08/25/21 0740  GLUCAP 86 121* 133* 146* 133*    Microbiology: Results for orders placed or performed during the hospital encounter of 08/16/21  Resp Panel by RT-PCR (Flu A&B, Covid) Nasopharyngeal Swab     Status: None   Collection Time: 08/16/21  8:28 PM   Specimen: Nasopharyngeal Swab; Nasopharyngeal(NP) swabs in vial transport medium  Result Value Ref Range Status   SARS Coronavirus 2 by RT PCR NEGATIVE NEGATIVE Final    Comment: (NOTE) SARS-CoV-2 target nucleic acids are NOT DETECTED.  The SARS-CoV-2 RNA is generally detectable in upper respiratory specimens during the acute phase of infection. The lowest concentration of SARS-CoV-2 viral copies this assay can detect is 138 copies/mL. A negative result does not preclude  SARS-Cov-2 infection and should not be used as the sole basis for treatment or other patient management decisions. A negative result may occur with  improper specimen collection/handling, submission of specimen other than nasopharyngeal swab, presence of viral mutation(s) within the areas targeted by this assay, and inadequate number of viral copies(<138 copies/mL). A negative result must be combined with clinical observations, patient history, and epidemiological information. The expected result is Negative.  Fact Sheet for Patients:  EntrepreneurPulse.com.au  Fact Sheet for Healthcare Providers:  IncredibleEmployment.be  This test is no t yet approved or cleared by the [place] FDA and  has been authorized for detection and/or diagnosis of SARS-CoV-2 by FDA under an Emergency Use Authorization (EUA). This EUA will remain  in effect (meaning this test can be used) for the duration of the COVID-19 declaration under Section 564(b)(1) of the Act, 21 U.S.C.section 360bbb-3(b)(1), unless the authorization is terminated  or revoked sooner.       Influenza A by PCR NEGATIVE NEGATIVE Final   Influenza B by PCR NEGATIVE NEGATIVE Final    Comment: (NOTE) The Xpert Xpress SARS-CoV-2/FLU/RSV plus assay is intended as an aid in the diagnosis of influenza from Nasopharyngeal swab specimens and should not be used as a sole basis for treatment. Nasal washings and aspirates are unacceptable for Xpert Xpress SARS-CoV-2/FLU/RSV testing.  Fact Sheet for Patients: EntrepreneurPulse.com.au  Fact Sheet for Healthcare Providers: IncredibleEmployment.be  This test is not yet approved or cleared by the [place] FDA and has been authorized for detection and/or diagnosis of SARS-CoV-2 by FDA under an Emergency Use Authorization (EUA). This EUA will remain in effect (meaning this test can be used) for the duration of  the COVID-19 declaration under Section 564(b)(1) of the Act, 21 U.S.C. section 360bbb-3(b)(1), unless the authorization is terminated or revoked.  Performed at [facility], [address], [place], [place] [address]   Urine Culture     Status: None   Collection Time: 08/17/21 11:00 AM   Specimen: Urine, Clean Catch  Result Value Ref Range Status   Specimen Description   Final    URINE, CLEAN CATCH Performed at [facility], [address] [place], [place] [address]    Special Requests   Final    NONE Performed at [facility]  [address] [place], [place] [address]    Culture   Final    NO GROWTH Performed at [facility], [place] [address] [place], [place] [address]    Report Status 08/19/2021 FINAL  Final  Culture, blood (Routine X 2) w Reflex to ID Panel     Status: None   Collection Time: 08/19/21  7:28 AM   Specimen: BLOOD LEFT ARM  Result Value Ref Range Status   Specimen Description BLOOD LEFT ARM BOTTLES DRAWN AEROBIC AND ANAEROBIC  Final   Special Requests   Final    Blood Culture results may not be optimal due to an excessive volume of blood received in culture bottles   Culture   Final    NO GROWTH 5 DAYS Performed at [facility], [address], [place], [place] [address]    Report Status 08/24/2021 FINAL  Final  Culture, blood (Routine X 2) w Reflex to ID Panel     Status: None   Collection Time: 08/19/21  8:27 AM   Specimen: BLOOD LEFT HAND  Result Value Ref Range Status   Specimen Description   Final    BLOOD LEFT HAND BOTTLES DRAWN AEROBIC AND ANAEROBIC   Special Requests Blood Culture adequate volume  Final   Culture   Final    NO GROWTH 5 DAYS Performed at [facility], [address] [place], [place] [address]    Report Status 08/24/2021 FINAL  Final  MRSA Next Gen by PCR, Nasal     Status: Abnormal   Collection Time: 08/21/21  5:40 PM   Specimen: Nasal Mucosa; Nasal Swab  Result Value Ref Range Status   MRSA by PCR Next Gen DETECTED (A)  NOT DETECTED Final    Comment: RESULT CALLED TO, READ BACK BY AND VERIFIED WITH: JESSICA HEARN RN,2021,08/21/2021, SELF S. (NOTE) The GeneXpert MRSA Assay (FDA approved for NASAL specimens only), is one component of a comprehensive MRSA colonization surveillance program. It is not intended to diagnose MRSA infection nor to guide or monitor treatment for MRSA infections. Test performance is not FDA approved in patients less than 54 years old. Performed at [facility], [address] [place], [place] [address]     Coagulation Studies: Recent Labs    08/23/21 1223  LABPROT 15.7*  INR 1.3*    Urinalysis: No results for input(s): COLORURINE, LABSPEC, PHURINE, GLUCOSEU, HGBUR, BILIRUBINUR, KETONESUR, PROTEINUR, UROBILINOGEN, NITRITE, LEUKOCYTESUR in the last 72 hours.  Invalid input(s): APPERANCEUR    Imaging: No results found.   Medications:    sodium chloride     sodium chloride     sodium chloride     ceFEPime (MAXIPIME) IV Stopped (08/24/21 1035)   furosemide (LASIX) 200 mg in dextrose 5% 100 mL (2mg /mL) infusion 4 mg/hr (08/24/21 2040)   labetalol (NORMODYNE) infusion 5 mg/mL Stopped (08/25/21 0009)    allopurinol  100 mg Oral Daily   atorvastatin  80 mg Oral QHS   Chlorhexidine Gluconate Cloth  6 each Topical Daily   [START ON 08/27/2021] citalopram  20 mg Oral Daily   dextromethorphan-guaiFENesin  1 tablet Oral BID   dicyclomine  20 mg Oral TID AC   diltiazem  60 mg Oral Q8H   gabapentin  300 mg Oral BID   insulin aspart  0-9 Units Subcutaneous TID WC   insulin detemir  15 Units Subcutaneous QHS   ipratropium-albuterol  3 mL Nebulization BID   linezolid  600 mg Oral Q12H   lipase/protease/amylase  36,000-72,000  Units Oral See admin instructions   metoprolol tartrate  100 mg Oral BID   mupirocin ointment  1 application Nasal BID   pantoprazole  40 mg Oral Daily   QUEtiapine  25 mg Oral QHS   sodium bicarbonate  650 mg Oral TID   sodium chloride flush  3 mL  Intravenous Q12H   [START ON 08/27/2021] venlafaxine XR  150 mg Oral Daily   sodium chloride, sodium chloride, sodium chloride, diphenhydrAMINE, diphenoxylate-atropine, haloperidol lactate, HYDROmorphone (DILAUDID) injection, HYDROmorphone, ipratropium-albuterol, LORazepam, meclizine, methocarbamol, metoprolol tartrate, ondansetron **OR** ondansetron (ZOFRAN) IV, sodium chloride flush, temazepam  Assessment/ Plan:  AKI/CKD stage IIIb - in setting of PNA and hypertensive urgency and chronic diarrhea.  Baseline Scr variable 1.6-2.2 and follows Dr. Theador Hawthorne as an outpatient. Required HD yesterday due to hyperkalemia with UF of 2 liters.    Hold off on HD for now and continue to follow for renal recovery.   Serologies negative   high K/L light chains noted  UOP has picked up with impending recovery Acute hypoxic respiratory failure - due to multifocal pneumonia vs CHF.  Currently on Abx per primary.   last dialysis 10/25 with 2 L removed. Negative balance noted . V/Q negative   HTN urgency/emergency -we will change metoprolol to labetalol.  Titrate as needed.  Continues on diltiazem and IV diuretics.   IV hydralazine 5 mg every 4 hours as needed AMS - is awake and alert but remains confused and doesn't remember dialysis yesterday. Hypomagnesemia - likely due to chronic diarrhea. Hyperkalemia - due to AKI, improved with HD 08/20/21. Anemia of critical illness - s/p transfusion.  Continue to follow H/H.  Thrombocytopenia - no schistocytes on smear  Platelet count improving with discontinuation of heparin.  ADAMST13  40.6% LDH 375 .  This could be associated with a malignant hypertension.  Platelets induced platelet antibody negative.  This could be an atypical HUS picture secondary to hypertension.  Therapy would be treatment of hypertension.     LOS: [place] @TODAY @8 :12 AM

## 2021-08-25 NOTE — Progress Notes (Signed)
PROGRESS NOTE    Heather [name]  HUT:[id number] DOB: 29-Feb-1951 DOA: 08/16/2021 PCP: [name], MD   Brief Narrative:   70 year old female with history of CKD stage IIIb, Crohn's disease and IBS with chronic diarrhea, hypertension, type 2 diabetes, BPPV who presented to the emergency room with blurry vision and diplopia for 2 days along with stabbing headache mostly on the back of her head.  She had urinary symptoms and was treated with doxycycline since last 7 days.  She also developed diffuse rash while taking it.  In the emergency room afebrile.  Heart rate normal.  Blood pressure systolic [phone].  Patient was given labetalol in the ER and blood pressure improved.  Neurology was consulted.  MRI of the brain and MRA/MRV was done and was essentially normal.  She was also found to be suffering with acute kidney injury on CKDIIIb.  She has required hemodialysis initiation on 10/23 with IJ catheter placement.  2 units PRBCs transfused on 10/23 as well for acute anemia.  She continues to have some ongoing confusion.  Nephrology continues to follow to determine need for long-term hemodialysis.  Assessment & Plan:   Principal Problem:   Blurry vision, bilateral Active Problems:   Essential hypertension   Type 2 diabetes with nephropathy (HCC)   Depression   Acute kidney injury superimposed on CKD (HCC)   Crohn's disease of small intestine with other complication (HCC)   Fusion of spine, cervicothoracic region   Hypomagnesemia   Acute hypokalemia   Acute respiratory failure with hypoxia (HCC)   Acute metabolic encephalopathy   Malignant hypertension   Thrombocytopenia (HCC)   Anemia due to chronic kidney disease   Acute hypoxemic respiratory failure-multifactorial with multifocal pneumonia versus acute diastolic CHF exacerbation -VQ scan did not show any evidence of PE -She was initially on room air on admission, but oxygen needs have trended after hospitalization, requiring up  to 15 L high flow nasal cannula -Currently she is down to 5 L nasal cannula -Continue to wean off oxygen as tolerated  Pneumonia -Noted on chest x-ray to have bilateral infiltrates -This was confirmed on CT chest -Despite aggressive diuresis, infiltrates have persisted -Discussed with pulmonology and since she was taking Humira prior to admission, there was concern for atypical infections -MRSA PCR noted to be positive -She is currently on cefepime and linezolid -She will likely need further evaluation with bronchoscopy and therefore will be transferred to [place] current antibiotics for now.  Acute diastolic congestive heart failure -Volume status is being managed with hemodialysis as well as Lasix infusion -Her weight is down approximately 20 pounds since admission -Nephrology following -Continue on Lasix infusion for now  Acute urinary retention -Initially did have Foley catheter, which was removed on 10/27 -Since Foley was removed, it has been noted that she has been having some urinary retention and she has been undergoing in and out catheterization -If she requires immediate catheterization for over 24 hours, would consider replacing Foley catheter   Acute encephalopathy -Etiology is not entirely clear -Certainly her severe hypertension may be contributing -Initial MRI done on admission did not show any acute findings, but mental status did appear to be intact at that time -MRI may need to be repeated -Her husband does describe some periods of confusion at home, but these appear to be transient.  Review of notes from earlier in this hospitalization show that she was awake, alert and conversant at that time.  Clearly her mental status has had a  progressive decline through this hospitalization. -Currently she remains quite agitated, confused, trying to get out of bed and refusing medications at times. -TSH, ammonia, ABG were noncontributory -ESR markedly elevated at  127 -Connective tissue disease work-up has thus far been unremarkable -Question if she has any underlying encephalitis -EEG may also be helpful in further work-up -Will need further neurology evaluation   AKI on CKD stage IIIb -Nephrology following and she had temporary dialysis catheter placed in right IJ -She underwent hemodialysis on 10/23 in 10/25 -Renal ultrasound with no significant findings -currently on lasix infusion and she does appear to be making urine -Defer further HD to nephrology  Hypertensive urgency -She was initially noted to be hypertensive on admission when she complained of headache and ocular symptoms -Subsequently, blood pressures were noted to be low which felt was contributing to her worsening renal failure -It appears that for the last several days, she has had difficult to control high blood pressure readings.  She was metoprolol, but this was proven to be ineffective and she would often refuse taking her oral dose -She was briefly started on labetalol infusion which did improve her blood pressures -Despite improvement of blood pressures, her mental status continued to remain confused/agitated -Labetalol infusion has since been discontinued and she been started on oral labetalol -We will likely need further titration of her antihypertensive medications   Acute on chronic anemia-stable -Likely related to CKD -2 unit PRBC transfusion on 10/23 -Monitor for overt bleeding -Recheck CBC in a.m. -Stool occult pending  Thrombocytopenia -Unclear etiology -HIT panel negative -No schistocytes noted on smear making TTP unlikely -DIC panel checked, elevated fibrinogen which would make DIC unlikely -LDH is elevated indicating some degree of hemolysis -We will continue to follow for now   Acute diplopia and headaches, was present on admission -Unclear etiology and assessed by neurology -Likely was related to severe hypomagnesemia which had been corrected -It was  felt that her occipital headache may also be referred from her chronic cervical spine disease. -Difficult to assess continued symptoms at this point, since patient is confused and unable to provide reliable history   Non-anion gap metabolic acidosis -Related to diarrhea and possibly AKI -Overall appears to have improved   Type 2 diabetes-controlled -Keep on SSI and levemir   Crohn's disease with chronic diarrhea -Humira to resume outpatient -no diarrhea at present   Petechial rash over extremities -Primary cause is unknown, but started after doxycycline and Diflucan, likely drug reaction -resolved    DVT prophylaxis: Heparin Code Status: DNR, confirmed with husband Family Communication: Discussed with husband over the phone 10/28 Disposition Plan:  Status is: Inpatient   Remains inpatient appropriate because: Continued encephalopathy, uncontrolled blood pressure and persistent pneumonia requiring further work-up     Consultants:  Nephrology Neurology Pulmonology   Procedures:  Right IJ hemodialysis catheter placement 10/23  Antimicrobials:  Anti-infectives (From admission, onward)    Start     Dose/Rate Route Frequency Ordered Stop   08/26/21 1200  ceFEPIme (MAXIPIME) 2 g in sodium chloride 0.9 % 100 mL IVPB        2 g 200 mL/hr over 30 Minutes Intravenous Every 24 hours 08/25/21 1710 08/29/21 1159   08/23/21 1000  linezolid (ZYVOX) tablet 600 mg        600 mg Oral Every 12 hours 08/22/21 1038 08/26/21 0959   08/23/21 0900  ceFEPIme (MAXIPIME) 2 g in sodium chloride 0.9 % 100 mL IVPB        2 g  200 mL/hr over 30 Minutes Intravenous Every 24 hours 08/23/21 0736 08/25/21 1309   08/19/21 0745  ceFEPIme (MAXIPIME) 2 g in sodium chloride 0.9 % 100 mL IVPB  Status:  Discontinued        2 g 200 mL/hr over 30 Minutes Intravenous Every 24 hours 08/19/21 0651 08/23/21 0736   08/19/21 0745  vancomycin (VANCOREADY) IVPB 750 mg/150 mL  Status:  Discontinued        750 mg 150  mL/hr over 60 Minutes Intravenous Every 48 hours 08/19/21 0651 08/22/21 1038   08/18/21 0945  fluconazole (DIFLUCAN) tablet 150 mg        150 mg Oral  Once 08/18/21 0853 08/18/21 0951      Subjective: Patient remains confused and agitated. Has required sedating meds throughout the day since she becomes agitated, tries to climb out of bed and starts pulling at her IVs  Objective: Vitals:   08/25/21 1300 08/25/21 1400 08/25/21 1500 08/25/21 1527  BP: (!) 217/70 (!) 167/136 (!) 174/57   Pulse: (!) 105 (!) 102 96   Resp: 14 (!) 21 16   Temp:    99.1 F (37.3 C)  TempSrc:    Axillary  SpO2: 96% 100% 98%   Weight:      Height:        Intake/Output Summary (Last 24 hours) at 08/25/2021 1722 Last data filed at 08/25/2021 1400 Gross per 24 hour  Intake 8 ml  Output 2550 ml  Net -2542 ml   Filed Weights   08/23/21 0448 08/24/21 0500 08/25/21 0459  Weight: 70.9 kg 69.1 kg 66.9 kg    Examination:  General exam: somnolent Respiratory system: Clear to auscultation. Respiratory effort normal. Cardiovascular system:RRR. No murmurs, rubs, gallops. Gastrointestinal system: Abdomen is nondistended, soft and nontender. No organomegaly or masses felt. Normal bowel sounds heard. Central nervous system: No focal neurological deficits. Extremities: No C/C/E, +pedal pulses Skin: No rashes, lesions or ulcers Psychiatry: somnolent and confused, agitated at times     Data Reviewed: I have personally reviewed following labs and imaging studies  CBC: Recent Labs  Lab 08/21/21 0445 08/22/21 0504 08/23/21 0443 08/23/21 1223 08/24/21 0430 08/25/21 0407  WBC 11.2* 10.0 9.2  --  5.2 5.4  HGB 9.5* 8.6* 8.5*  --  7.9* 8.1*  HCT 28.7* 25.8* 26.6*  --  25.0* 25.7*  MCV 92.3 93.5 94.7  --  96.5 95.9  PLT 102* 82* 94* 97* 90* 696*   Basic Metabolic Panel: Recent Labs  Lab 08/19/21 0504 08/19/21 0511 08/20/21 0400 08/20/21 1900 08/21/21 0445 08/22/21 0504 08/23/21 0443 08/24/21 0430  08/25/21 0407  NA 136 135 134*   < > 135 136 140 141 143  K 5.3* 5.3* 6.2*   < > 4.0 3.7 3.7 3.6 3.8  CL 105 105 106   < > 100 101 102 103 104  CO2 18* 19* 14*   < > 21* '24 27 27 29  ' GLUCOSE 133* 133* 146*   < > 126* 149* 130* 96 146*  BUN 78* 79* 89*   < > 47* 57* 40* 55* 72*  CREATININE 3.93* 3.88* 4.63*   < > 3.29* 3.71* 2.81* 3.12* 3.65*  CALCIUM 7.6* 7.6* 7.2*   < > 8.3* 8.8* 8.3* 8.7* 8.8*  MG  --  1.7 2.1  --  1.9 2.0 2.0  --   --   PHOS 7.0*  --   --   --   --  5.3* 3.5 3.8 4.8*   < > =  values in this interval not displayed.   GFR: Estimated Creatinine Clearance: 13.7 mL/min (A) (by C-G formula based on SCr of 3.65 mg/dL (H)). Liver Function Tests: Recent Labs  Lab 08/19/21 0511 08/22/21 0504 08/23/21 0443 08/24/21 0430 08/25/21 0407  AST 22  --   --   --   --   ALT 15  --   --   --   --   ALKPHOS 69  --   --   --   --   BILITOT 0.6  --   --   --   --   PROT 6.1*  --   --   --   --   ALBUMIN 3.0* 2.4* 2.7* 2.3* 2.4*   No results for input(s): LIPASE, AMYLASE in the last 168 hours. Recent Labs  Lab 08/20/21 0803 08/24/21 1718  AMMONIA 30 25   Coagulation Profile: Recent Labs  Lab 08/20/21 1027 08/23/21 1223  INR 1.6* 1.3*   Cardiac Enzymes: No results for input(s): CKTOTAL, CKMB, CKMBINDEX, TROPONINI in the last 168 hours. BNP (last 3 results) No results for input(s): PROBNP in the last 8760 hours. HbA1C: No results for input(s): HGBA1C in the last 72 hours. CBG: Recent Labs  Lab 08/24/21 1142 08/24/21 1627 08/24/21 2129 08/25/21 0740 08/25/21 1116  GLUCAP 121* 133* 146* 133* 149*   Lipid Profile: No results for input(s): CHOL, HDL, LDLCALC, TRIG, CHOLHDL, LDLDIRECT in the last 72 hours. Thyroid Function Tests: No results for input(s): TSH, T4TOTAL, FREET4, T3FREE, THYROIDAB in the last 72 hours.  Anemia Panel: No results for input(s): VITAMINB12, FOLATE, FERRITIN, TIBC, IRON, RETICCTPCT in the last 72 hours. Sepsis Labs: Recent Labs  Lab  08/19/21 0620  PROCALCITON 0.31    Recent Results (from the past 240 hour(s))  Resp Panel by RT-PCR (Flu A&B, Covid) Nasopharyngeal Swab     Status: None   Collection Time: 08/16/21  8:28 PM   Specimen: Nasopharyngeal Swab; Nasopharyngeal(NP) swabs in vial transport medium  Result Value Ref Range Status   SARS Coronavirus 2 by RT PCR NEGATIVE NEGATIVE Final    Comment: (NOTE) SARS-CoV-2 target nucleic acids are NOT DETECTED.  The SARS-CoV-2 RNA is generally detectable in upper respiratory specimens during the acute phase of infection. The lowest concentration of SARS-CoV-2 viral copies this assay can detect is 138 copies/mL. A negative result does not preclude SARS-Cov-2 infection and should not be used as the sole basis for treatment or other patient management decisions. A negative result may occur with  improper specimen collection/handling, submission of specimen other than nasopharyngeal swab, presence of viral mutation(s) within the areas targeted by this assay, and inadequate number of viral copies(<138 copies/mL). A negative result must be combined with clinical observations, patient history, and epidemiological information. The expected result is Negative.  Fact Sheet for Patients:  EntrepreneurPulse.com.au  Fact Sheet for Healthcare Providers:  IncredibleEmployment.be  This test is no t yet approved or cleared by the [place] FDA and  has been authorized for detection and/or diagnosis of SARS-CoV-2 by FDA under an Emergency Use Authorization (EUA). This EUA will remain  in effect (meaning this test can be used) for the duration of the COVID-19 declaration under Section 564(b)(1) of the Act, 21 U.S.C.section 360bbb-3(b)(1), unless the authorization is terminated  or revoked sooner.       Influenza A by PCR NEGATIVE NEGATIVE Final   Influenza B by PCR NEGATIVE NEGATIVE Final    Comment: (NOTE) The Xpert Xpress  SARS-CoV-2/FLU/RSV plus assay is  intended as an aid in the diagnosis of influenza from Nasopharyngeal swab specimens and should not be used as a sole basis for treatment. Nasal washings and aspirates are unacceptable for Xpert Xpress SARS-CoV-2/FLU/RSV testing.  Fact Sheet for Patients: EntrepreneurPulse.com.au  Fact Sheet for Healthcare Providers: IncredibleEmployment.be  This test is not yet approved or cleared by the [place] FDA and has been authorized for detection and/or diagnosis of SARS-CoV-2 by FDA under an Emergency Use Authorization (EUA). This EUA will remain in effect (meaning this test can be used) for the duration of the COVID-19 declaration under Section 564(b)(1) of the Act, 21 U.S.C. section 360bbb-3(b)(1), unless the authorization is terminated or revoked.  Performed at [facility], [address] [place], [place] [address]   Urine Culture     Status: None   Collection Time: 08/17/21 11:00 AM   Specimen: Urine, Clean Catch  Result Value Ref Range Status   Specimen Description   Final    URINE, CLEAN CATCH Performed at [facility], [address] [place], [place] [address]    Special Requests   Final    NONE Performed at [facility], [address], [place], [place] [address]    Culture   Final    NO GROWTH Performed at [facility], [place] [address], [place], [place] [address]    Report Status 08/19/2021 FINAL  Final  Culture, blood (Routine X 2) w Reflex to ID Panel     Status: None   Collection Time: 08/19/21  7:28 AM   Specimen: BLOOD LEFT ARM  Result Value Ref Range Status   Specimen Description BLOOD LEFT ARM BOTTLES DRAWN AEROBIC AND ANAEROBIC  Final   Special Requests   Final    Blood Culture results may not be optimal due to an excessive volume of blood received in culture bottles   Culture   Final    NO GROWTH 5 DAYS Performed at [facility], [address] [place], [place] [address]    Report  Status 08/24/2021 FINAL  Final  Culture, blood (Routine X 2) w Reflex to ID Panel     Status: None   Collection Time: 08/19/21  8:27 AM   Specimen: BLOOD LEFT HAND  Result Value Ref Range Status   Specimen Description   Final    BLOOD LEFT HAND BOTTLES DRAWN AEROBIC AND ANAEROBIC   Special Requests Blood Culture adequate volume  Final   Culture   Final    NO GROWTH 5 DAYS Performed at [facility], [address], [place], [place] [address]    Report Status 08/24/2021 FINAL  Final  MRSA Next Gen by PCR, Nasal     Status: Abnormal   Collection Time: 08/21/21  5:40 PM   Specimen: Nasal Mucosa; Nasal Swab  Result Value Ref Range Status   MRSA by PCR Next Gen DETECTED (A) NOT DETECTED Final    Comment: RESULT CALLED TO, READ BACK BY AND VERIFIED WITH: JESSICA HEARN RN,2021,08/21/2021, SELF S. (NOTE) The GeneXpert MRSA Assay (FDA approved for NASAL specimens only), is one component of a comprehensive MRSA colonization surveillance program. It is not intended to diagnose MRSA infection nor to guide or monitor treatment for MRSA infections. Test performance is not FDA approved in patients less than 23 years old. Performed at [facility], [address] [place], [place] [address]          Radiology Studies: CT CHEST WO CONTRAST  Result Date: 08/25/2021 CLINICAL DATA:  Respiratory illness, shortness  of breath EXAM: CT CHEST WITHOUT CONTRAST TECHNIQUE: Multidetector CT imaging of the chest was performed following the standard protocol without IV contrast. COMPARISON:  CT chest 12/22/2014 FINDINGS: Cardiovascular: Heart is enlarged. No pericardial effusion identified. Coronary artery calcifications. Main pulmonary artery is normal caliber. Thoracic aorta is normal in course and caliber with mild calcified plaques. Mediastinum/Nodes: A few mildly enlarged mediastinal lymph nodes identified measuring up to 1.2 cm in short axis precarinal. No bulky axillary lymphadenopathy or definite hilar  lymphadenopathy identified, given limitations of a noncontrast study. Right-sided central line noted. Lungs/Pleura: Extensive bilateral increased interstitial and irregular consolidative and ground-glass airspace opacities throughout all lobes. Trace right pleural effusion. Minor subsegmental atelectasis at the lung bases. No pneumothorax. Upper Abdomen: Subtle nodularity of the liver suggesting cirrhosis. Spleen is enlarged measuring 13 cm in length. Musculoskeletal: No suspicious bony lesions visualized. IMPRESSION: 1. Bilateral extensive interstitial and alveolar densities which likely represent pneumonia. Trace right pleural effusion. Continued follow-up recommended. 2. Cardiomegaly. 3. Evidence of hepatic cirrhosis. Splenomegaly which could represent portal hypertension. Electronically Signed   By: Ofilia Neas M.D.   On: 08/25/2021 11:57        Scheduled Meds:  allopurinol  100 mg Oral Daily   atorvastatin  80 mg Oral QHS   Chlorhexidine Gluconate Cloth  6 each Topical Daily   [START ON 08/27/2021] citalopram  20 mg Oral Daily   dextromethorphan-guaiFENesin  1 tablet Oral BID   dicyclomine  20 mg Oral TID AC   diltiazem  60 mg Oral Q8H   gabapentin  300 mg Oral BID   insulin aspart  0-9 Units Subcutaneous TID WC   insulin detemir  15 Units Subcutaneous QHS   ipratropium-albuterol  3 mL Nebulization BID   labetalol  200 mg Oral BID   linezolid  600 mg Oral Q12H   lipase/protease/amylase  36,000-72,000 Units Oral See admin instructions   mupirocin ointment  1 application Nasal BID   pantoprazole  40 mg Oral Daily   sodium bicarbonate  650 mg Oral TID   sodium chloride flush  3 mL Intravenous Q12H   [START ON 08/27/2021] venlafaxine XR  150 mg Oral Daily   Continuous Infusions:  sodium chloride     sodium chloride     sodium chloride     [START ON 08/26/2021] ceFEPime (MAXIPIME) IV     furosemide (LASIX) 200 mg in dextrose 5% 100 mL (51m/mL) infusion 4 mg/hr (08/25/21 1032)      LOS: 8 days    Time spent: 35 minutes    JKathie Dike MD [facility]  If 7PM-7AM, please contact night-coverage [email or web address] 08/25/2021, 5:22 PM

## 2021-08-25 NOTE — Progress Notes (Signed)
Patient had 10 beat run of vtach. MD on unit, made aware.

## 2021-08-25 NOTE — Progress Notes (Addendum)
[place] A. Merlene Laughter, MD     [email or web address]          Heather [name] is an 69 y.o. female.   Assessment/Plan:  I was asked see the patient in follow-up for confusion and encephalopathy.  She is clinically much different than when I initially saw her in the emergency room.  She was agitated, restless and confused.  She was lucid and coherent and can provide an adequate history when I saw her a few days ago in the emergency room.  I think the picture is most consistent with encephalopathy in the hospital/toxic metabolic encephalopathy.  Other types of causes such as autoimmune encephalitis are unlikely.  Medication effect may be an issue.  She is currently only on Effexor and dextromethorphan.  These medications will be discontinued. The Effexor will be weaned over a couple of days.  An EEG is recommended.    Objective: Vital signs in last 24 hours: Temp:  [98.7 F (37.1 C)-99.1 F (37.3 C)] 99.1 F (37.3 C) (10/28 1527) Pulse Rate:  [87-115] 96 (10/28 1500) Resp:  [11-24] 16 (10/28 1500) BP: (145-231)/(45-136) 174/57 (10/28 1500) SpO2:  [94 %-100 %] 98 % (10/28 1500) Weight:  [66.9 kg] 66.9 kg (10/28 0459)   She is restless; she is in four-point restraints.  She does open her eyes and follow commands.  Brainstem reflexes are all intact including pupils 5 mm and briskly reactive.  Visual fields are intact and extraocular movements are full.  Reflexes are intact.  She does follow midline commands.  She moves all 4 extremities well/vigorously.  Intake/Output from previous day: 10/27 0701 - 10/28 0700 In: 428.3 [I.V.:28.3; IV Piggyback:100] Out: 1175 [Urine:1175] Intake/Output this shift: Total I/O In: -  Out: 1375 [Urine:1375] Nutritional status:  Diet Order             Diet heart healthy/carb modified Room service appropriate? Yes; Fluid consistency: Thin  Diet effective now                    Lab Results: Results for orders placed or  performed during the hospital encounter of 08/16/21 (from the past 48 hour(s))  Glucose, capillary     Status: Abnormal   Collection Time: 08/23/21  9:46 PM  Result Value Ref Range   Glucose-Capillary 138 (H) 70 - 99 mg/dL    Comment: Glucose reference range applies only to samples taken after fasting for at least 8 hours.  Renal function panel     Status: Abnormal   Collection Time: 08/24/21  4:30 AM  Result Value Ref Range   Sodium 141 135 - 145 mmol/L   Potassium 3.6 3.5 - 5.1 mmol/L   Chloride 103 98 - 111 mmol/L   CO2 27 22 - 32 mmol/L   Glucose, Bld 96 70 - 99 mg/dL    Comment: Glucose reference range applies only to samples taken after fasting for at least 8 hours.   BUN 55 (H) 8 - 23 mg/dL   Creatinine, Ser 3.12 (H) 0.44 - 1.00 mg/dL   Calcium 8.7 (L) 8.9 - 10.3 mg/dL   Phosphorus 3.8 2.5 - 4.6 mg/dL   Albumin 2.3 (L) 3.5 - 5.0 g/dL   GFR, Estimated 16 (L) >60 mL/min    Comment: (NOTE) Calculated using the CKD-EPI Creatinine Equation (2021)    Anion gap 11 5 - 15    Comment: Performed at [facility], [address], [place], [place] [address]  CBC  Status: Abnormal   Collection Time: 08/24/21  4:30 AM  Result Value Ref Range   WBC 5.2 4.0 - 10.5 K/uL   RBC 2.59 (L) 3.87 - 5.11 MIL/uL   Hemoglobin 7.9 (L) 12.0 - 15.0 g/dL   HCT 25.0 (L) 36.0 - 46.0 %   MCV 96.5 80.0 - 100.0 fL   MCH 30.5 26.0 - 34.0 pg   MCHC 31.6 30.0 - 36.0 g/dL   RDW 16.2 (H) 11.5 - 15.5 %   Platelets 90 (L) 150 - 400 K/uL    Comment: SPECIMEN CHECKED FOR CLOTS Immature Platelet Fraction may be clinically indicated, consider ordering this additional test [id number] CONSISTENT WITH PREVIOUS RESULT REPEATED TO VERIFY    nRBC 0.0 0.0 - 0.2 %    Comment: Performed at [facility], [address] [place], [place] [address]  Glucose, capillary     Status: None   Collection Time: 08/24/21  8:13 AM  Result Value Ref Range   Glucose-Capillary 86 70 - 99 mg/dL    Comment: Glucose reference range  applies only to samples taken after fasting for at least 8 hours.  Glucose, capillary     Status: Abnormal   Collection Time: 08/24/21 11:42 AM  Result Value Ref Range   Glucose-Capillary 121 (H) 70 - 99 mg/dL    Comment: Glucose reference range applies only to samples taken after fasting for at least 8 hours.  Glucose, capillary     Status: Abnormal   Collection Time: 08/24/21  4:27 PM  Result Value Ref Range   Glucose-Capillary 133 (H) 70 - 99 mg/dL    Comment: Glucose reference range applies only to samples taken after fasting for at least 8 hours.  Blood gas, arterial     Status: Abnormal   Collection Time: 08/24/21  4:55 PM  Result Value Ref Range   FIO2 40.00    pH, Arterial 7.356 7.350 - 7.450   pCO2 arterial 50.2 (H) 32.0 - 48.0 mmHg   pO2, Arterial 98.3 83.0 - 108.0 mmHg   Bicarbonate 26.2 20.0 - 28.0 mmol/L   Acid-Base Excess 2.3 (H) 0.0 - 2.0 mmol/L   O2 Saturation 96.2 %   Patient temperature 37.5    Collection site LEFT RADIAL    Drawn by [id number]    Sample type ARTERIAL    Allens test (pass/fail) PASS PASS    Comment: Performed at [facility], [address], [place], [place] [address]  Ammonia     Status: None   Collection Time: 08/24/21  5:18 PM  Result Value Ref Range   Ammonia 25 9 - 35 umol/L    Comment: Performed at [facility], [address] [place], [place] [address]  Glucose, capillary     Status: Abnormal   Collection Time: 08/24/21  9:29 PM  Result Value Ref Range   Glucose-Capillary 146 (H) 70 - 99 mg/dL    Comment: Glucose reference range applies only to samples taken after fasting for at least 8 hours.   Comment 1 Notify RN    Comment 2 Document in Chart   Renal function panel     Status: Abnormal   Collection Time: 08/25/21  4:07 AM  Result Value Ref Range   Sodium 143 135 - 145 mmol/L   Potassium 3.8 3.5 - 5.1 mmol/L   Chloride 104 98 - 111 mmol/L   CO2 29 22 - 32 mmol/L   Glucose, Bld 146 (H) 70 - 99 mg/dL    Comment: Glucose reference  range applies only to samples taken after fasting for at least 8 hours.   BUN 72 (H) 8 - 23 mg/dL   Creatinine, Ser 3.65 (H) 0.44 - 1.00 mg/dL   Calcium 8.8 (L) 8.9 - 10.3 mg/dL   Phosphorus 4.8 (H) 2.5 - 4.6 mg/dL   Albumin 2.4 (L) 3.5 - 5.0 g/dL   GFR, Estimated 13 (L) >60 mL/min    Comment: (NOTE) Calculated using the CKD-EPI Creatinine Equation (2021)    Anion gap 10 5 - 15    Comment: Performed at [facility], [address], [place], [place] [address]  CBC     Status: Abnormal   Collection Time: 08/25/21  4:07 AM  Result Value Ref Range   WBC 5.4 4.0 - 10.5 K/uL   RBC 2.68 (L) 3.87 - 5.11 MIL/uL   Hemoglobin 8.1 (L) 12.0 - 15.0 g/dL   HCT 25.7 (L) 36.0 - 46.0 %   MCV 95.9 80.0 - 100.0 fL   MCH 30.2 26.0 - 34.0 pg   MCHC 31.5 30.0 - 36.0 g/dL   RDW 15.9 (H) 11.5 - 15.5 %   Platelets 114 (L) 150 - 400 K/uL    Comment: SPECIMEN CHECKED FOR CLOTS Immature Platelet Fraction may be clinically indicated, consider ordering this additional test [id number] CONSISTENT WITH PREVIOUS RESULT    nRBC 0.0 0.0 - 0.2 %    Comment: Performed at [facility], [address] [place], [place] [address]  Sedimentation rate     Status: Abnormal   Collection Time: 08/25/21  4:07 AM  Result Value Ref Range   Sed Rate 127 (H) 0 - 22 mm/hr    Comment: Performed at [facility], [address], [place],  [address]  Glucose, capillary     Status: Abnormal   Collection Time: 08/25/21  7:40 AM  Result Value Ref Range   Glucose-Capillary 133 (H) 70 - 99 mg/dL    Comment: Glucose reference range applies only to samples taken after fasting for at least 8 hours.  Glucose, capillary     Status: Abnormal   Collection Time: 08/25/21 11:16 AM  Result Value Ref Range   Glucose-Capillary 149 (H) 70 - 99 mg/dL    Comment: Glucose reference range applies only to samples taken after fasting for at least 8 hours.    Lipid Panel No results for input(s): CHOL, TRIG, HDL, CHOLHDL, VLDL, LDLCALC in the  last 72 hours.  Studies/Results:   Medications:  Scheduled Meds:  allopurinol  100 mg Oral Daily   atorvastatin  80 mg Oral QHS   Chlorhexidine Gluconate Cloth  6 each Topical Daily   [START ON 08/27/2021] citalopram  20 mg Oral Daily   dextromethorphan-guaiFENesin  1 tablet Oral BID   diltiazem  60 mg Oral Q8H   gabapentin  300 mg Oral BID   insulin aspart  0-9 Units Subcutaneous TID WC   insulin detemir  15 Units Subcutaneous QHS   ipratropium-albuterol  3 mL Nebulization BID   labetalol  200 mg Oral BID   linezolid  600 mg Oral Q12H   lipase/protease/amylase  36,000-72,000 Units Oral See admin instructions   mupirocin ointment  1 application Nasal BID   pantoprazole  40 mg Oral Daily   sodium bicarbonate  650 mg Oral TID   sodium chloride flush  3 mL Intravenous Q12H   [START ON 08/27/2021] venlafaxine XR  150 mg Oral Daily   Continuous Infusions:  sodium chloride     sodium chloride  sodium chloride     [START ON 08/26/2021] ceFEPime (MAXIPIME) IV     furosemide (LASIX) 200 mg in dextrose 5% 100 mL (2mg /mL) infusion 4 mg/hr (08/25/21 1032)   PRN Meds:.sodium chloride, sodium chloride, sodium chloride, acetaminophen, diphenhydrAMINE, diphenoxylate-atropine, haloperidol lactate, hydrALAZINE, HYDROmorphone (DILAUDID) injection, HYDROmorphone, ipratropium-albuterol, LORazepam, meclizine, methocarbamol, ondansetron **OR** ondansetron (ZOFRAN) IV, sodium chloride flush, temazepam     LOS: 8 days   Ilija Maxim A. Merlene Laughter, M.D.  Diplomate, Tax adviser of Psychiatry and Neurology ( Neurology).

## 2021-08-25 NOTE — Progress Notes (Signed)
Called and gave nurse to nurse report to Kerrie Pleasure on unit [place]. Reported to Night nurse to call [facility] for transport and patient husband to update and will call night nurse on [place] to update.

## 2021-08-25 NOTE — Progress Notes (Signed)
Bipap is PRN.  Not needed at this time.

## 2021-08-26 ENCOUNTER — Inpatient Hospital Stay (HOSPITAL_COMMUNITY): Payer: 59

## 2021-08-26 DIAGNOSIS — J9601 Acute respiratory failure with hypoxia: Secondary | ICD-10-CM | POA: Diagnosis not present

## 2021-08-26 DIAGNOSIS — H538 Other visual disturbances: Secondary | ICD-10-CM | POA: Diagnosis not present

## 2021-08-26 DIAGNOSIS — R918 Other nonspecific abnormal finding of lung field: Secondary | ICD-10-CM | POA: Diagnosis not present

## 2021-08-26 DIAGNOSIS — L8990 Pressure ulcer of unspecified site, unspecified stage: Secondary | ICD-10-CM | POA: Insufficient documentation

## 2021-08-26 LAB — CBC
HCT: 30.2 % — ABNORMAL LOW (ref 36.0–46.0)
HCT: 31 % — ABNORMAL LOW (ref 36.0–46.0)
Hemoglobin: 9.6 g/dL — ABNORMAL LOW (ref 12.0–15.0)
Hemoglobin: 9.7 g/dL — ABNORMAL LOW (ref 12.0–15.0)
MCH: 29.8 pg (ref 26.0–34.0)
MCH: 29.6 pg (ref 26.0–34.0)
MCHC: 31.8 g/dL (ref 30.0–36.0)
MCHC: 31.3 g/dL (ref 30.0–36.0)
MCV: 93.8 fL (ref 80.0–100.0)
MCV: 94.5 fL (ref 80.0–100.0)
Platelets: 150 10*3/uL (ref 150–400)
Platelets: 163 10*3/uL (ref 150–400)
RBC: 3.22 MIL/uL — ABNORMAL LOW (ref 3.87–5.11)
RBC: 3.28 MIL/uL — ABNORMAL LOW (ref 3.87–5.11)
RDW: 15.6 % — ABNORMAL HIGH (ref 11.5–15.5)
RDW: 15.8 % — ABNORMAL HIGH (ref 11.5–15.5)
WBC: 7.5 10*3/uL (ref 4.0–10.5)
WBC: 7.5 10*3/uL (ref 4.0–10.5)
nRBC: 0 % (ref 0.0–0.2)
nRBC: 0 % (ref 0.0–0.2)

## 2021-08-26 LAB — MRSA NEXT GEN BY PCR, NASAL: MRSA by PCR Next Gen: DETECTED — AB

## 2021-08-26 LAB — RESPIRATORY PANEL BY PCR

## 2021-08-26 LAB — GLUCOSE, CAPILLARY
Glucose-Capillary: 106 mg/dL — ABNORMAL HIGH (ref 70–99)
Glucose-Capillary: 161 mg/dL — ABNORMAL HIGH (ref 70–99)
Glucose-Capillary: 203 mg/dL — ABNORMAL HIGH (ref 70–99)
Glucose-Capillary: 217 mg/dL — ABNORMAL HIGH (ref 70–99)

## 2021-08-26 LAB — RENAL FUNCTION PANEL
Albumin: 2.4 g/dL — ABNORMAL LOW (ref 3.5–5.0)
Anion gap: 14 (ref 5–15)
BUN: 73 mg/dL — ABNORMAL HIGH (ref 8–23)
CO2: 29 mmol/L (ref 22–32)
Calcium: 9.6 mg/dL (ref 8.9–10.3)
Chloride: 101 mmol/L (ref 98–111)
Creatinine, Ser: 3.54 mg/dL — ABNORMAL HIGH (ref 0.44–1.00)
GFR, Estimated: 13 mL/min — ABNORMAL LOW (ref >60)
Glucose, Bld: 203 mg/dL — ABNORMAL HIGH (ref 70–99)
Phosphorus: 3.3 mg/dL (ref 2.5–4.6)
Potassium: 3.2 mmol/L — ABNORMAL LOW (ref 3.5–5.1)
Sodium: 144 mmol/L (ref 135–145)

## 2021-08-26 LAB — SCLERODERMA DIAGNOSTIC PROFILE
Anti Nuclear Antibody (ANA): NEGATIVE
Scleroderma (Scl-70) (ENA) Antibody, IgG: <0.2 AI (ref 0.0–0.9)

## 2021-08-26 LAB — PROTIME-INR
INR: 1.2 (ref 0.8–1.2)
Prothrombin Time: 15.6 seconds — ABNORMAL HIGH (ref 11.4–15.2)

## 2021-08-26 LAB — MAGNESIUM: Magnesium: 2 mg/dL (ref 1.7–2.4)

## 2021-08-26 MED ORDER — THIAMINE HCL 100 MG/ML IJ SOLN
500.0000 mg | INTRAVENOUS | Status: AC
  Administered 2021-08-26 – 2021-08-29 (×4): 500 mg via INTRAVENOUS
  Filled 2021-08-26 (×4): qty 5

## 2021-08-26 MED ORDER — VANCOMYCIN HCL 1250 MG/250ML IV SOLN
1250.0000 mg | Freq: Once | INTRAVENOUS | Status: AC
  Administered 2021-08-26: 1250 mg via INTRAVENOUS
  Filled 2021-08-26: qty 250

## 2021-08-26 MED ORDER — HYDRALAZINE HCL 50 MG PO TABS
50.0000 mg | ORAL_TABLET | Freq: Three times a day (TID) | ORAL | Status: DC
  Administered 2021-08-26 – 2021-08-31 (×14): 50 mg via ORAL
  Filled 2021-08-26 (×14): qty 1

## 2021-08-26 MED ORDER — HYDRALAZINE HCL 20 MG/ML IJ SOLN
10.0000 mg | INTRAMUSCULAR | Status: DC | PRN
  Administered 2021-08-30: 10 mg via INTRAVENOUS
  Filled 2021-08-26: qty 1

## 2021-08-26 MED ORDER — METOPROLOL TARTRATE 5 MG/5ML IV SOLN
5.0000 mg | Freq: Once | INTRAVENOUS | Status: AC
  Administered 2021-08-26: 5 mg via INTRAVENOUS
  Filled 2021-08-26: qty 5

## 2021-08-26 MED ORDER — SODIUM CHLORIDE 0.9 % IV SOLN
2.0000 g | INTRAVENOUS | Status: DC
  Administered 2021-08-26 – 2021-08-30 (×5): 2 g via INTRAVENOUS
  Filled 2021-08-26 (×5): qty 20

## 2021-08-26 MED ORDER — THIAMINE HCL 100 MG/ML IJ SOLN: 500.0000 mg | Freq: Every day | INTRAVENOUS | Status: DC

## 2021-08-26 NOTE — Progress Notes (Addendum)
Pt went into A.Fib, rate 108.  EKG in chart.  Looks like labetalol wasn't given last evening.  BP thus also running very high 122 systolic.  Will order metoprolol 5mg  IV x1 now.  Post metoprolol: HR now 90 with BP 159/73  Regarding AC: pt with mild thrombocytopenia this admission, ? HIT earlier in admission though antibody negative.  Will hold off on starting any anticoagulation at this time.

## 2021-08-26 NOTE — Progress Notes (Signed)
[facility] ROUNDING NOTE   Subjective:   Transferred from [facility] to [facility] for additional neurology input given ongoing AMS.  UOP yesterday was 2.5L She isn't able to answer any questions on my exam.  She's in mitten restraints.   Objective:  Vital signs in last 24 hours:  Temp:  [98.1 F (36.7 C)-99.2 F (37.3 C)] 98.5 F (36.9 C) (10/29 0825) Pulse Rate:  [91-108] 91 (10/29 0900) Resp:  [15-28] 18 (10/29 0900) BP: (103-202)/(42-135) 201/76 (10/29 0900) SpO2:  [94 %-100 %] 99 % (10/29 0900) Weight:  [63.9 kg-64.2 kg] 63.9 kg (10/29 0500)  Weight change: -2.7 kg Filed Weights   08/25/21 0459 08/26/21 0050 08/26/21 0500  Weight: 66.9 kg 64.2 kg 63.9 kg    Intake/Output: I/O last 3 completed shifts: In: 394.1 [P.O.:300; I.V.:94.1] Out: 3650 [Urine:3650]   Intake/Output this shift:  Total I/O In: 480 [P.O.:480] Out: -   CVS- RRR RS- CTA ABD- BS present soft non-distended EXT- no edema Neuro - arousable and mumbling but cannot answer questions  GU - purewick  Basic Metabolic Panel: Recent Labs  Lab 08/20/21 0400 08/20/21 1900 08/21/21 0445 08/22/21 0504 08/23/21 0443 08/24/21 0430 08/25/21 0407 08/26/21 0435  NA 134*   < > 135 136 140 141 143  --   K 6.2*   < > 4.0 3.7 3.7 3.6 3.8  --   CL 106   < > 100 101 102 103 104  --   CO2 14*   < > 21* 24 27 27 29   --   GLUCOSE 146*   < > 126* 149* 130* 96 146*  --   BUN 89*   < > 47* 57* 40* 55* 72*  --   CREATININE 4.63*   < > 3.29* 3.71* 2.81* 3.12* 3.65*  --   CALCIUM 7.2*   < > 8.3* 8.8* 8.3* 8.7* 8.8*  --   MG 2.1  --  1.9 2.0 2.0  --   --  2.0  PHOS  --   --   --  5.3* 3.5 3.8 4.8*  --    < > = values in this interval not displayed.     Liver Function Tests: Recent Labs  Lab 08/22/21 0504 08/23/21 0443 08/24/21 0430 08/25/21 0407  ALBUMIN 2.4* 2.7* 2.3* 2.4*    No results for input(s): LIPASE, AMYLASE in the last 168 hours. Recent Labs  Lab 08/20/21 0803 08/24/21 1718  AMMONIA 30  25     CBC: Recent Labs  Lab 08/22/21 0504 08/23/21 0443 08/23/21 1223 08/24/21 0430 08/25/21 0407 08/26/21 0435  WBC 10.0 9.2  --  5.2 5.4 7.5  HGB 8.6* 8.5*  --  7.9* 8.1* 9.7*  HCT 25.8* 26.6*  --  25.0* 25.7* 31.0*  MCV 93.5 94.7  --  96.5 95.9 94.5  PLT 82* 94* 97* 90* 114* 163     Cardiac Enzymes: No results for input(s): CKTOTAL, CKMB, CKMBINDEX, TROPONINI in the last 168 hours.  BNP: Invalid input(s): POCBNP  CBG: Recent Labs  Lab 08/25/21 1116 08/25/21 1802 08/25/21 2221 08/26/21 0601 08/26/21 1409  GLUCAP 149* 192* 160* 106* 217*     Microbiology: Results for orders placed or performed during the hospital encounter of 08/16/21  Resp Panel by RT-PCR (Flu A&B, Covid) Nasopharyngeal Swab     Status: None   Collection Time: 08/16/21  8:28 PM   Specimen: Nasopharyngeal Swab; Nasopharyngeal(NP) swabs in vial transport medium  Result Value Ref Range Status  SARS Coronavirus 2 by RT PCR NEGATIVE NEGATIVE Final    Comment: (NOTE) SARS-CoV-2 target nucleic acids are NOT DETECTED.  The SARS-CoV-2 RNA is generally detectable in upper respiratory specimens during the acute phase of infection. The lowest concentration of SARS-CoV-2 viral copies this assay can detect is 138 copies/mL. A negative result does not preclude SARS-Cov-2 infection and should not be used as the sole basis for treatment or other patient management decisions. A negative result may occur with  improper specimen collection/handling, submission of specimen other than nasopharyngeal swab, presence of viral mutation(s) within the areas targeted by this assay, and inadequate number of viral copies(<138 copies/mL). A negative result must be combined with clinical observations, patient history, and epidemiological information. The expected result is Negative.  Fact Sheet for Patients:  EntrepreneurPulse.com.au  Fact Sheet for Healthcare Providers:   IncredibleEmployment.be  This test is no t yet approved or cleared by the [place] FDA and  has been authorized for detection and/or diagnosis of SARS-CoV-2 by FDA under an Emergency Use Authorization (EUA). This EUA will remain  in effect (meaning this test can be used) for the duration of the COVID-19 declaration under Section 564(b)(1) of the Act, 21 U.S.C.section 360bbb-3(b)(1), unless the authorization is terminated  or revoked sooner.       Influenza A by PCR NEGATIVE NEGATIVE Final   Influenza B by PCR NEGATIVE NEGATIVE Final    Comment: (NOTE) The Xpert Xpress SARS-CoV-2/FLU/RSV plus assay is intended as an aid in the diagnosis of influenza from Nasopharyngeal swab specimens and should not be used as a sole basis for treatment. Nasal washings and aspirates are unacceptable for Xpert Xpress SARS-CoV-2/FLU/RSV testing.  Fact Sheet for Patients: EntrepreneurPulse.com.au  Fact Sheet for Healthcare Providers: IncredibleEmployment.be  This test is not yet approved or cleared by the [place] FDA and has been authorized for detection and/or diagnosis of SARS-CoV-2 by FDA under an Emergency Use Authorization (EUA). This EUA will remain in effect (meaning this test can be used) for the duration of the COVID-19 declaration under Section 564(b)(1) of the Act, 21 U.S.C. section 360bbb-3(b)(1), unless the authorization is terminated or revoked.  Performed at [facility], [address] [place], [place] [address]   Urine Culture     Status: None   Collection Time: 08/17/21 11:00 AM   Specimen: Urine, Clean Catch  Result Value Ref Range Status   Specimen Description   Final    URINE, CLEAN CATCH Performed at [facility], [address], [place], [place] [address]    Special Requests   Final    NONE Performed at [facility], [address], [place], [place] [address]    Culture   Final    NO GROWTH Performed  at [facility], [place] [address], [place], [place] [address]    Report Status 08/19/2021 FINAL  Final  Culture, blood (Routine X 2) w Reflex to ID Panel     Status: None   Collection Time: 08/19/21  7:28 AM   Specimen: BLOOD LEFT ARM  Result Value Ref Range Status   Specimen Description BLOOD LEFT ARM BOTTLES DRAWN AEROBIC AND ANAEROBIC  Final   Special Requests   Final    Blood Culture results may not be optimal due to an excessive volume of blood received in culture bottles   Culture   Final    NO GROWTH 5 DAYS Performed at [facility], [address], [place], [place] [address]    Report Status 08/24/2021  FINAL  Final  Culture, blood (Routine X 2) w Reflex to ID Panel     Status: None   Collection Time: 08/19/21  8:27 AM   Specimen: BLOOD LEFT HAND  Result Value Ref Range Status   Specimen Description   Final    BLOOD LEFT HAND BOTTLES DRAWN AEROBIC AND ANAEROBIC   Special Requests Blood Culture adequate volume  Final   Culture   Final    NO GROWTH 5 DAYS Performed at [facility], [address], [place], [place] [address]    Report Status 08/24/2021 FINAL  Final  MRSA Next Gen by PCR, Nasal     Status: Abnormal   Collection Time: 08/21/21  5:40 PM   Specimen: Nasal Mucosa; Nasal Swab  Result Value Ref Range Status   MRSA by PCR Next Gen DETECTED (A) NOT DETECTED Final    Comment: RESULT CALLED TO, READ BACK BY AND VERIFIED WITH: JESSICA HEARN RN,2021,08/21/2021, SELF S. (NOTE) The GeneXpert MRSA Assay (FDA approved for NASAL specimens only), is one component of a comprehensive MRSA colonization surveillance program. It is not intended to diagnose MRSA infection nor to guide or monitor treatment for MRSA infections. Test performance is not FDA approved in patients less than 20 years old. Performed at [facility], [address] [place], [place] [address]   Respiratory (~20 pathogens) panel by PCR     Status: None   Collection Time: 08/26/21  9:31 AM   Specimen:  Nasopharyngeal Swab; Respiratory  Result Value Ref Range Status   Adenovirus NOT DETECTED NOT DETECTED Final   Coronavirus 229E NOT DETECTED NOT DETECTED Final    Comment: (NOTE) The Coronavirus on the Respiratory Panel, DOES NOT test for the novel  Coronavirus (2019 nCoV)    Coronavirus HKU1 NOT DETECTED NOT DETECTED Final   Coronavirus NL63 NOT DETECTED NOT DETECTED Final   Coronavirus OC43 NOT DETECTED NOT DETECTED Final   Metapneumovirus NOT DETECTED NOT DETECTED Final   Rhinovirus / Enterovirus NOT DETECTED NOT DETECTED Final   Influenza A NOT DETECTED NOT DETECTED Final   Influenza B NOT DETECTED NOT DETECTED Final   Parainfluenza Virus 1 NOT DETECTED NOT DETECTED Final   Parainfluenza Virus 2 NOT DETECTED NOT DETECTED Final   Parainfluenza Virus 3 NOT DETECTED NOT DETECTED Final   Parainfluenza Virus 4 NOT DETECTED NOT DETECTED Final   Respiratory Syncytial Virus NOT DETECTED NOT DETECTED Final   Bordetella pertussis NOT DETECTED NOT DETECTED Final   Bordetella Parapertussis NOT DETECTED NOT DETECTED Final   Chlamydophila pneumoniae NOT DETECTED NOT DETECTED Final   Mycoplasma pneumoniae NOT DETECTED NOT DETECTED Final    Comment: Performed at [facility], [place] [address], [place], [place] [address]    Coagulation Studies: Recent Labs    08/26/21 1343  LABPROT 15.6*  INR 1.2     Urinalysis: No results for input(s): COLORURINE, LABSPEC, PHURINE, GLUCOSEU, HGBUR, BILIRUBINUR, KETONESUR, PROTEINUR, UROBILINOGEN, NITRITE, LEUKOCYTESUR in the last 72 hours.  Invalid input(s): APPERANCEUR    Imaging: CT CHEST WO CONTRAST  Result Date: 08/25/2021 CLINICAL DATA:  Respiratory illness, shortness of breath EXAM: CT CHEST WITHOUT CONTRAST TECHNIQUE: Multidetector CT imaging of the chest was performed following the standard protocol without IV contrast. COMPARISON:  CT chest 12/22/2014 FINDINGS: Cardiovascular: Heart is enlarged. No pericardial effusion identified.  Coronary artery calcifications. Main pulmonary artery is normal caliber. Thoracic aorta is normal in course and caliber with mild calcified plaques. Mediastinum/Nodes: A few mildly enlarged mediastinal lymph nodes identified  measuring up to 1.2 cm in short axis precarinal. No bulky axillary lymphadenopathy or definite hilar lymphadenopathy identified, given limitations of a noncontrast study. Right-sided central line noted. Lungs/Pleura: Extensive bilateral increased interstitial and irregular consolidative and ground-glass airspace opacities throughout all lobes. Trace right pleural effusion. Minor subsegmental atelectasis at the lung bases. No pneumothorax. Upper Abdomen: Subtle nodularity of the liver suggesting cirrhosis. Spleen is enlarged measuring 13 cm in length. Musculoskeletal: No suspicious bony lesions visualized. IMPRESSION: 1. Bilateral extensive interstitial and alveolar densities which likely represent pneumonia. Trace right pleural effusion. Continued follow-up recommended. 2. Cardiomegaly. 3. Evidence of hepatic cirrhosis. Splenomegaly which could represent portal hypertension. Electronically Signed   By: Ofilia Neas M.D.   On: 08/25/2021 11:57     Medications:    sodium chloride     sodium chloride     sodium chloride 250 mL (08/26/21 1235)   cefTRIAXone (ROCEPHIN)  IV 2 g (08/26/21 1239)   furosemide (LASIX) 200 mg in dextrose 5% 100 mL (2mg /mL) infusion 4 mg/hr (08/26/21 0521)   thiamine injection     vancomycin      allopurinol  100 mg Oral Daily   atorvastatin  80 mg Oral QHS   Chlorhexidine Gluconate Cloth  6 each Topical Daily   diltiazem  60 mg Oral Q8H   hydrALAZINE  50 mg Oral Q8H   insulin aspart  0-9 Units Subcutaneous TID WC   insulin detemir  15 Units Subcutaneous QHS   ipratropium-albuterol  3 mL Nebulization BID   labetalol  200 mg Oral BID   lipase/protease/amylase  36,000-72,000 Units Oral See admin instructions   mupirocin ointment  1 application  Nasal BID   pantoprazole  40 mg Oral Daily   sodium bicarbonate  650 mg Oral TID   sodium chloride flush  3 mL Intravenous Q12H   venlafaxine XR  75 mg Oral Daily   sodium chloride, sodium chloride, sodium chloride, acetaminophen, diphenhydrAMINE, diphenoxylate-atropine, haloperidol lactate, hydrALAZINE, HYDROmorphone (DILAUDID) injection, HYDROmorphone, ipratropium-albuterol, LORazepam, meclizine, ondansetron **OR** ondansetron (ZOFRAN) IV, sodium chloride flush, temazepam  Assessment/ Plan:  AKI/CKD stage IIIb - in setting of PNA and hypertensive urgency and chronic diarrhea.  Baseline Scr variable 1.6-2.2 and follows Dr. Theador Hawthorne as an outpatient. Required HD 10/25 due to hyperkalemia with UF of 2 liters.   Serologies negative   high K/L light chains noted; will check SPEP and UPEP.  Noted proteinuria that is not new. UOP has picked up with impending recovery.  Does not appear grossly volume overloaded so will d/c lasix gtt and given PRN dosing for now Maintain temp HD catheter for now but if continued improvement in coming days will d/c Acute hypoxic respiratory failure - due to multifocal pneumonia vs CHF.  Currently on Abx per primary.   last dialysis 10/25 with 2 L removed. Negative balance noted . V/Q negative   HTN urgency/emergency -Has diltiazem, labetalol and hydralazine po, IV prn metoprolol and hydralazine.  BP extremely labile in last 24h with several med changes noted -- no further changes for now but hopefully can get on a simplified regimen.   AMS  waxing and waning; neurology evaluating at [facility] Hypomagnesemia - likely due to chronic diarrhea. Hyperkalemia - due to AKI, improved with HD 08/20/21. Anemia of critical illness - s/p transfusion.  Continue to follow H/H.  Thrombocytopenia - no schistocytes on smear  Platelet count improving with discontinuation of heparin.  ADAMST13  40.6% LDH 375 .  This could be associated with a malignant hypertension.  Platelets induced platelet  antibody negative.      LOS: 9 Dianne Bady A Issa Luster @TODAY @2 :53 PM

## 2021-08-26 NOTE — Progress Notes (Signed)
Pt transferred to [place] via [employer] [facility].  All VS stable at time of transfer.  Pts husband informed of move and new room number at [facility].  All pt belongings including cell phone, eyeglasses, and clothing sent with patient.

## 2021-08-26 NOTE — Progress Notes (Signed)
PROGRESS NOTE    Heather [name]  AST:[id number] DOB: [birth year] DOA: 08/16/2021 PCP: [name], MD    Brief Narrative:  This 70 year old female with history of CKD stage IIIb, Crohn's disease and IBS with chronic diarrhea, hypertension, type 2 diabetes, BPPV who presented to the emergency room with blurry vision and diplopia for 2 days associated  with stabbing headache mostly on the back of her head.  She had urinary symptoms and was treated with doxycycline for last 7 days.  She also developed diffuse rash while taking it.  In the emergency room afebrile.  Heart rate normal.  Blood pressure systolic 989-211.  Patient was given labetalol in the ER and blood pressure improved.  Neurology was consulted.  MRI of the brain and MRA/MRV was done and was essentially normal.  She was also found to be suffering with acute kidney injury on CKDIIIb.  She has required hemodialysis initiation on 10/23 with IJ catheter placement.  2 units PRBCs transfused on 10/23 as well for acute anemia.  She continues to have some ongoing confusion.  Nephrology continues to follow to determine need for long-term hemodialysis.  Assessment & Plan:   Principal Problem:   Blurry vision, bilateral Active Problems:   Essential hypertension   Type 2 diabetes with nephropathy (HCC)   Depression   Acute kidney injury superimposed on CKD (HCC)   Crohn's disease of small intestine with other complication (HCC)   Fusion of spine, cervicothoracic region   Hypomagnesemia   Acute hypokalemia   Acute respiratory failure with hypoxia (HCC)   Acute metabolic encephalopathy   Malignant hypertension   Thrombocytopenia (HCC)   Anemia due to chronic kidney disease   Pressure injury of skin  Acute hypoxic respiratory failure > multifactorial This could be due to multifocal pneumonia versus acute diastolic CHF exacerbation. V/Q scan did not show any evidence of PE She was initially on room air on admission but her oxygen  requirement has gone up requiring 15 L of high flow oxygen but now weaned down to 5L/min Continue to wean off oxygen as tolerated.   Multifocal pneumonia: CT chest showed bilateral pulmonary infiltrate . Despite aggressive diuresis, infiltrates have persisted. Pulmonology consulted, since patient was taking Humira prior to admission, there is concern for atypical infection. MRSA PCR noted positive . Patient was started on cefepime and linezolid. Neurology recommended to change cefepime if possible. Cefepime changed to ceftriaxone. She might need further evaluation with bronchoscopy.   Continue ceftriaxone and linezolid.   Acute diastolic congestive heart failure: Volume status is being managed with hemodialysis as well as Lasix infusion. Her weight is down approximately 20 pounds since admission. Continue on Lasix infusion for now.  Nephrology is following.   Acute urinary retention: Patient has been requiring in and out catheterization. Continue Foley catheter.    Acute Encephalopathy:  Etiology is not entirely clear.  Uncontrolled severe hypertension could be contributing. Initial MRI unremarkable, mental status does appear to be intact at the time. MRI may need to be repeated. Her husband does describe some periods of confusion at home, but these appear to be transient.   Review of notes from earlier in this hospitalization showed that she was awake, alert and conversant at that time.   Clearly her mental status had a progressive decline through this hospitalization. Currently she remains quite agitated, confused, trying to get out of bed and refusing medications at times. TSH, ammonia, ABG were noncontributory. ESR markedly elevated at 127 Connective tissue disease work-up has  thus far been unremarkable She might be having any encephalitis Neurology consulted.  Recommended MRI brain, EEG, change cefepime if possible Thiamine 500 mg IV daily, possible lumbar puncture.   AKI  on CKD stage IIIb: Patient had temporary dialysis catheter placed in right IJ. She underwent hemodialysis on 10/23 in 10/25 Renal ultrasound with no significant findings Currently on lasix infusion and she does appear to be making urine. Further management as per nephrology   Hypertensive urgency:  Patient presented with headache and ocular symptoms associated with uncontrolled hypertension on admission. Subsequently, blood pressures were noted to be low which felt was contributing to her worsening renal failure. Patient has difficult to control BP readings for the last several days. She was briefly started on labetalol infusion which did improve her blood pressures Despite improvement of blood pressures, her mental status continued to remain confused/agitated Labetalol infusion has since been discontinued and she been started on oral labetalol Likely need further titration of her antihypertensive medications.   Acute on chronic anemia > stable Could be related to CKD. S/p 2 unit PRBC transfusion on 10/23. Monitor for overt bleeding. Stool occult pending   Thrombocytopenia:  HIT panel negative. No schistocytes noted on smear making TTP unlikely DIC panel checked, elevated fibrinogen which would make DIC unlikely LDH is elevated indicating some degree of hemolysis  Acute diplopia and headaches: Unclear etiology and assessed by neurology Likely was related to severe hypomagnesemia which had been corrected It was felt that her occipital headache may also be referred from her chronic cervical spine disease. Difficult to assess continued symptoms at this point, since patient is confused and unable to provide reliable history   Non-anion gap metabolic acidosis It could be related to diarrhea and possibly AKI. Overall improved.   Type 2 diabetes:  Continue SSI and Levemir  Crohn's disease with chronic diarrhea Resume Humira outpatient. No diarrhea at present    DVT  prophylaxis: Heparin Code Status: DNR Family Communication: No family at bed side. Disposition Plan:   Status is: Inpatient  Remains inpatient appropriate because:  Continued encephalopathy, uncontrolled blood pressure and persistent pneumonia requiring further work-up  Anticipated discharge to SNF or home health services in few days   Consultants:  Nephrology Neurology Pulmonology PCCM  Procedures: Right IJ hemodialysis catheter 10/23 Antimicrobials:   Anti-infectives (From admission, onward)    Start     Dose/Rate Route Frequency Ordered Stop   08/26/21 1200  ceFEPIme (MAXIPIME) 2 g in sodium chloride 0.9 % 100 mL IVPB  Status:  Discontinued        2 g 200 mL/hr over 30 Minutes Intravenous Every 24 hours 08/25/21 1710 08/26/21 0811   08/26/21 0900  cefTRIAXone (ROCEPHIN) 2 g in sodium chloride 0.9 % 100 mL IVPB        2 g 200 mL/hr over 30 Minutes Intravenous Every 24 hours 08/26/21 0811     08/23/21 1000  linezolid (ZYVOX) tablet 600 mg        600 mg Oral Every 12 hours 08/22/21 1038 08/26/21 0959   08/23/21 0900  ceFEPIme (MAXIPIME) 2 g in sodium chloride 0.9 % 100 mL IVPB        2 g 200 mL/hr over 30 Minutes Intravenous Every 24 hours 08/23/21 0736 08/25/21 1900   08/19/21 0745  ceFEPIme (MAXIPIME) 2 g in sodium chloride 0.9 % 100 mL IVPB  Status:  Discontinued        2 g 200 mL/hr over 30 Minutes Intravenous Every 24 hours  08/19/21 0651 08/23/21 0736   08/19/21 0745  vancomycin (VANCOREADY) IVPB 750 mg/150 mL  Status:  Discontinued        750 mg 150 mL/hr over 60 Minutes Intravenous Every 48 hours 08/19/21 0651 08/22/21 1038   08/18/21 0945  fluconazole (DIFLUCAN) tablet 150 mg        150 mg Oral  Once 08/18/21 2993 08/18/21 0951        Subjective: Patient was seen and examined at bedside.  Overnight events noted.  Patient is alert but confused. Her blood pressure still remains elevated. She is following partial commands.  Objective: Vitals:   08/26/21 0431  08/26/21 0500 08/26/21 0825 08/26/21 0900  BP: (!) 174/80  (!) 191/96 (!) 201/76  Pulse: (!) 101   91  Resp: '16  19 18  ' Temp: 98.7 F (37.1 C)  98.5 F (36.9 C)   TempSrc: Axillary  Oral   SpO2: 99%  100% 99%  Weight:  63.9 kg    Height:        Intake/Output Summary (Last 24 hours) at 08/26/2021 1250 Last data filed at 08/26/2021 7169 Gross per 24 hour  Intake 386.13 ml  Output 2475 ml  Net -2088.87 ml   Filed Weights   08/25/21 0459 08/26/21 0050 08/26/21 0500  Weight: 66.9 kg 64.2 kg 63.9 kg    Examination:  General exam: Appears comfortable, confused, following partial commands.  Chronically ill looking. Respiratory system: Clear to auscultation. Respiratory effort normal. Cardiovascular system: S1-S2 heard, regular rate and rhythm, no murmur.   Gastrointestinal system: Abdomen is soft, nontender, nondistended, BS + Central nervous system: Alert and oriented x 1, confused, following partial commands. No focal neurological deficits. Extremities: No edema, no cyanosis, no clubbing. Skin: No rashes, lesions or ulcers Psychiatry: Judgement and insight appear normal. Mood & affect appropriate.     Data Reviewed: I have personally reviewed following labs and imaging studies  CBC: Recent Labs  Lab 08/22/21 0504 08/23/21 0443 08/23/21 1223 08/24/21 0430 08/25/21 0407 08/26/21 0435  WBC 10.0 9.2  --  5.2 5.4 7.5  HGB 8.6* 8.5*  --  7.9* 8.1* 9.7*  HCT 25.8* 26.6*  --  25.0* 25.7* 31.0*  MCV 93.5 94.7  --  96.5 95.9 94.5  PLT 82* 94* 97* 90* 114* 678   Basic Metabolic Panel: Recent Labs  Lab 08/20/21 0400 08/20/21 1900 08/21/21 0445 08/22/21 0504 08/23/21 0443 08/24/21 0430 08/25/21 0407 08/26/21 0435  NA 134*   < > 135 136 140 141 143  --   K 6.2*   < > 4.0 3.7 3.7 3.6 3.8  --   CL 106   < > 100 101 102 103 104  --   CO2 14*   < > 21* '24 27 27 29  ' --   GLUCOSE 146*   < > 126* 149* 130* 96 146*  --   BUN 89*   < > 47* 57* 40* 55* 72*  --   CREATININE  4.63*   < > 3.29* 3.71* 2.81* 3.12* 3.65*  --   CALCIUM 7.2*   < > 8.3* 8.8* 8.3* 8.7* 8.8*  --   MG 2.1  --  1.9 2.0 2.0  --   --  2.0  PHOS  --   --   --  5.3* 3.5 3.8 4.8*  --    < > = values in this interval not displayed.   GFR: Estimated Creatinine Clearance: 12.6 mL/min (A) (by C-G formula based on  SCr of 3.65 mg/dL (H)). Liver Function Tests: Recent Labs  Lab 08/22/21 0504 08/23/21 0443 08/24/21 0430 08/25/21 0407  ALBUMIN 2.4* 2.7* 2.3* 2.4*   No results for input(s): LIPASE, AMYLASE in the last 168 hours. Recent Labs  Lab 08/20/21 0803 08/24/21 1718  AMMONIA 30 25   Coagulation Profile: Recent Labs  Lab 08/20/21 1027 08/23/21 1223  INR 1.6* 1.3*   Cardiac Enzymes: No results for input(s): CKTOTAL, CKMB, CKMBINDEX, TROPONINI in the last 168 hours. BNP (last 3 results) No results for input(s): PROBNP in the last 8760 hours. HbA1C: No results for input(s): HGBA1C in the last 72 hours. CBG: Recent Labs  Lab 08/25/21 0740 08/25/21 1116 08/25/21 1802 08/25/21 2221 08/26/21 0601  GLUCAP 133* 149* 192* 160* 106*   Lipid Profile: No results for input(s): CHOL, HDL, LDLCALC, TRIG, CHOLHDL, LDLDIRECT in the last 72 hours. Thyroid Function Tests: No results for input(s): TSH, T4TOTAL, FREET4, T3FREE, THYROIDAB in the last 72 hours. Anemia Panel: No results for input(s): VITAMINB12, FOLATE, FERRITIN, TIBC, IRON, RETICCTPCT in the last 72 hours. Sepsis Labs: No results for input(s): PROCALCITON, LATICACIDVEN in the last 168 hours.  Recent Results (from the past 240 hour(s))  Resp Panel by RT-PCR (Flu A&B, Covid) Nasopharyngeal Swab     Status: None   Collection Time: 08/16/21  8:28 PM   Specimen: Nasopharyngeal Swab; Nasopharyngeal(NP) swabs in vial transport medium  Result Value Ref Range Status   SARS Coronavirus 2 by RT PCR NEGATIVE NEGATIVE Final    Comment: (NOTE) SARS-CoV-2 target nucleic acids are NOT DETECTED.  The SARS-CoV-2 RNA is generally  detectable in upper respiratory specimens during the acute phase of infection. The lowest concentration of SARS-CoV-2 viral copies this assay can detect is 138 copies/mL. A negative result does not preclude SARS-Cov-2 infection and should not be used as the sole basis for treatment or other patient management decisions. A negative result may occur with  improper specimen collection/handling, submission of specimen other than nasopharyngeal swab, presence of viral mutation(s) within the areas targeted by this assay, and inadequate number of viral copies(<138 copies/mL). A negative result must be combined with clinical observations, patient history, and epidemiological information. The expected result is Negative.  Fact Sheet for Patients:  EntrepreneurPulse.com.au  Fact Sheet for Healthcare Providers:  IncredibleEmployment.be  This test is no t yet approved or cleared by the [place] FDA and  has been authorized for detection and/or diagnosis of SARS-CoV-2 by FDA under an Emergency Use Authorization (EUA). This EUA will remain  in effect (meaning this test can be used) for the duration of the COVID-19 declaration under Section 564(b)(1) of the Act, 21 U.S.C.section 360bbb-3(b)(1), unless the authorization is terminated  or revoked sooner.       Influenza A by PCR NEGATIVE NEGATIVE Final   Influenza B by PCR NEGATIVE NEGATIVE Final    Comment: (NOTE) The Xpert Xpress SARS-CoV-2/FLU/RSV plus assay is intended as an aid in the diagnosis of influenza from Nasopharyngeal swab specimens and should not be used as a sole basis for treatment. Nasal washings and aspirates are unacceptable for Xpert Xpress SARS-CoV-2/FLU/RSV testing.  Fact Sheet for Patients: EntrepreneurPulse.com.au  Fact Sheet for Healthcare Providers: IncredibleEmployment.be  This test is not yet approved or cleared by the [place] FDA  and has been authorized for detection and/or diagnosis of SARS-CoV-2 by FDA under an Emergency Use Authorization (EUA). This EUA will remain in effect (meaning this test can be used) for the duration of the COVID-19 declaration  under Section 564(b)(1) of the Act, 21 U.S.C. section 360bbb-3(b)(1), unless the authorization is terminated or revoked.  Performed at [facility], [address], [place], [place] [address]   Urine Culture     Status: None   Collection Time: 08/17/21 11:00 AM   Specimen: Urine, Clean Catch  Result Value Ref Range Status   Specimen Description   Final    URINE, CLEAN CATCH Performed at [facility], [address] [place], [place] [address]    Special Requests   Final    NONE Performed at [facility], [address], [place], [place] [address]    Culture   Final    NO GROWTH Performed at [facility], [place] [address], [place], [place] [address]    Report Status 08/19/2021 FINAL  Final  Culture, blood (Routine X 2) w Reflex to ID Panel     Status: None   Collection Time: 08/19/21  7:28 AM   Specimen: BLOOD LEFT ARM  Result Value Ref Range Status   Specimen Description BLOOD LEFT ARM BOTTLES DRAWN AEROBIC AND ANAEROBIC  Final   Special Requests   Final    Blood Culture results may not be optimal due to an excessive volume of blood received in culture bottles   Culture   Final    NO GROWTH 5 DAYS Performed at [facility], [address], [place], [place] [address]    Report Status 08/24/2021 FINAL  Final  Culture, blood (Routine X 2) w Reflex to ID Panel     Status: None   Collection Time: 08/19/21  8:27 AM   Specimen: BLOOD LEFT HAND  Result Value Ref Range Status   Specimen Description   Final    BLOOD LEFT HAND BOTTLES DRAWN AEROBIC AND ANAEROBIC   Special Requests Blood Culture adequate volume  Final   Culture   Final    NO GROWTH 5 DAYS Performed at [facility], [address], [place], [place] [address]    Report Status 08/24/2021  FINAL  Final  MRSA Next Gen by PCR, Nasal     Status: Abnormal   Collection Time: 08/21/21  5:40 PM   Specimen: Nasal Mucosa; Nasal Swab  Result Value Ref Range Status   MRSA by PCR Next Gen DETECTED (A) NOT DETECTED Final    Comment: RESULT CALLED TO, READ BACK BY AND VERIFIED WITH: JESSICA HEARN RN,2021,08/21/2021, SELF S. (NOTE) The GeneXpert MRSA Assay (FDA approved for NASAL specimens only), is one component of a comprehensive MRSA colonization surveillance program. It is not intended to diagnose MRSA infection nor to guide or monitor treatment for MRSA infections. Test performance is not FDA approved in patients less than 42 years old. Performed at [facility], [address] [place], [place] [address]          Radiology Studies: CT CHEST WO CONTRAST  Result Date: 08/25/2021 CLINICAL DATA:  Respiratory illness, shortness of breath EXAM: CT CHEST WITHOUT CONTRAST TECHNIQUE: Multidetector CT imaging of the chest was performed following the standard protocol without IV contrast. COMPARISON:  CT chest 12/22/2014 FINDINGS: Cardiovascular: Heart is enlarged. No pericardial effusion identified. Coronary artery calcifications. Main pulmonary artery is normal caliber. Thoracic aorta is normal in course and caliber with mild calcified plaques. Mediastinum/Nodes: A few mildly enlarged mediastinal lymph nodes identified measuring up to 1.2 cm in short axis precarinal. No bulky axillary lymphadenopathy or definite hilar lymphadenopathy identified, given limitations of a noncontrast study. Right-sided central line noted. Lungs/Pleura: Extensive bilateral increased  interstitial and irregular consolidative and ground-glass airspace opacities throughout all lobes. Trace right pleural effusion. Minor subsegmental atelectasis at the lung bases. No pneumothorax. Upper Abdomen: Subtle nodularity of the liver suggesting cirrhosis. Spleen is enlarged measuring 13 cm in length. Musculoskeletal: No  suspicious bony lesions visualized. IMPRESSION: 1. Bilateral extensive interstitial and alveolar densities which likely represent pneumonia. Trace right pleural effusion. Continued follow-up recommended. 2. Cardiomegaly. 3. Evidence of hepatic cirrhosis. Splenomegaly which could represent portal hypertension. Electronically Signed   By: Ofilia Neas M.D.   On: 08/25/2021 11:57    Scheduled Meds:  allopurinol  100 mg Oral Daily   atorvastatin  80 mg Oral QHS   Chlorhexidine Gluconate Cloth  6 each Topical Daily   diltiazem  60 mg Oral Q8H   insulin aspart  0-9 Units Subcutaneous TID WC   insulin detemir  15 Units Subcutaneous QHS   ipratropium-albuterol  3 mL Nebulization BID   labetalol  200 mg Oral BID   lipase/protease/amylase  36,000-72,000 Units Oral See admin instructions   mupirocin ointment  1 application Nasal BID   pantoprazole  40 mg Oral Daily   sodium bicarbonate  650 mg Oral TID   sodium chloride flush  3 mL Intravenous Q12H   venlafaxine XR  75 mg Oral Daily   Continuous Infusions:  sodium chloride     sodium chloride     sodium chloride 250 mL (08/26/21 1235)   cefTRIAXone (ROCEPHIN)  IV 2 g (08/26/21 1239)   furosemide (LASIX) 200 mg in dextrose 5% 100 mL (20m/mL) infusion 4 mg/hr (08/26/21 0521)   thiamine injection       LOS: 9 days    Time spent: 35 mins    Finley Dinkel, MD [facility]   If 7PM-7AM, please contact night-coverage

## 2021-08-26 NOTE — Progress Notes (Signed)
Pt arrived from [facility]. CHG bath give. Oriented to self only.  Gets very aggressive when has to be moved and can cuss at others and at times swing her limbs at them.  On 3 L oxygen nasal cannula.  Found heart rate looking irregular. Heart rate from 90s to low 120s. Probably AFib.  Will inform physician.  Lupita Dawn, RN

## 2021-08-26 NOTE — Progress Notes (Signed)
EEG complete - results pending 

## 2021-08-26 NOTE — Procedures (Signed)
Routine EEG Report  Heather [name] is a 70 y.o. female with a history of encephalopathy who is undergoing an EEG to evaluate for seizures.  Report: This EEG was acquired with electrodes placed according to the International 10-20 electrode system (including Fp1, Fp2, F3, F4, C3, C4, P3, P4, O1, O2, T3, T4, T5, T6, A1, A2, Fz, Cz, Pz). The following electrodes were missing or displaced: none.  The occipital dominant rhythm was 7-8 Hz with overriding beta frequencies. This activity is reactive to stimulation. Drowsiness was manifested by background fragmentation; deeper stages of sleep were not identified. There was no focal slowing. There were no interictal epileptiform discharges. There were no electrographic seizures identified. Photic stimulation and hyperventilation were not performed.  Impression and clinical correlation: This EEG was obtained while awake and drowsy and is abnormal due to mild diffuse slowing indicative of global cerebral dysfunction. No epileptiform abnormalities were observed during this recording.  Su Monks, MD [facility] [phone]  If 7pm- 7am, please page neurology on call as listed in [place].

## 2021-08-26 NOTE — Consult Note (Signed)
NAME:  Heather [name], MRN:  [id number], DOB:  15-Dec-1950, LOS: 9 ADMISSION DATE:  08/16/2021, CONSULTATION DATE:  08/26/2021 REFERRING MD:  Dr. Dwyane Dee, CHIEF COMPLAINT:    History of Present Illness:  Heather [name] is a 70 y.o. medical history significant for CKD stage IIIb, Crohn's disease and rectal bowel syndrome, hypertension, type 2 diabetes and BPPV who presented initially to the emergency department with complaints of blurred vision, diplopia and severe headache x2 days.  Additionally reported urinary symptoms concerning for UTI which started on doxycycline.   Patient is seen significantly hypotensive and neurology was consulted.  MRI of the brain obtained and relatively unremarkable.  Patient was admitted for further management of AKI superimposed on CKD and severe hypertension.  On 10/23 patient required placement of right IJ HD catheter and initiation of hemodialysis.  10/28 patient was transferred from [facility] to [facility] for further neurological evaluation given persistent encephalopathy.  PCCM also consulted for additional support.   Pertinent  Medical History  CKD stage IIIb Anemia B12 deficiency Brain tumor Depression Hypertension Insomnia Pneumonia Stroke Type 2 diabetes  Significant Hospital Events: Including procedures, antibiotic start and stop dates in addition to other pertinent events   10/19 admitted for blurred vision and diplopia 10/23 hemodialysis initiated 10/28 transferred to [facility] for further neurological and critical care support  Interim History / Subjective:  Seen lying in bed acutely encephalopathic with continuous attempts to get out of bed  Objective   Blood pressure (!) 191/96, pulse (!) 101, temperature 98.5 F (36.9 C), temperature source Oral, resp. rate 19, height 5\' 4"  (1.626 m), weight 63.9 kg, SpO2 100 %.        Intake/Output Summary (Last 24 hours) at 08/26/2021 0900 Last data filed at 08/26/2021 0605 Gross  per 24 hour  Intake 386.13 ml  Output 2475 ml  Net -2088.87 ml   Filed Weights   08/25/21 0459 08/26/21 0050 08/26/21 0500  Weight: 66.9 kg 64.2 kg 63.9 kg    Examination: General: Acute on chronic mildly deconditioned elderly female lying in bed in moderate discomfort HEENT: [place]/AT, MM pink/moist, PERRL,  Neuro: Able to state name but significantly encephalopathic, unable to reorient, continue with attempts to try to get out of bed CV: s1s2 regular rate and rhythm, no murmur, rubs, or gallops,  PULM: Clear to auscultation bilaterally, no increased work of breathing, no added breath sounds GI: soft, bowel sounds active in all 4 quadrants, non-tender, non-distended Extremities: warm/dry, no edema  Skin: no rashes or lesions  Resolved Hospital Problem list     Assessment & Plan:  Acute hypoxemic respiratory failure -Secondary to multifocal pneumonia versus acute decompensation of congestive heart failure -VQ scan negative for PE -Immunosuppressed at baseline given use of Humira P: Continue supplemental oxygen for SPO2 goal greater than 90 Supplemental oxygen pulmonary hygiene Monitoring for aspiration given diminished mentation Delirium precautions Minimize sedation Continue empiric antibiotics including cefepime nasal  Acute diastolic congestive heart failure -Echo 10/24 EF 60-65  Hypertensive urgency  -Patient presented significantly hypertensive with headache and ocular symptoms P: Continuous telemetry  Heart health diet with sodium restriction when able we mentation allows  Strict intake and output  Daily weight to assess volume status Volume removal per HD Closely monitor renal function and electrolytes  Ensure hemodynamic control  Continue home antihypertensives including Coreg and Effexor As needed IV hydralazine/labetalol  Acute metabolic encephalopathy -MRI on admission relatively unremarkable -Concern for underlying PRES Acute diplopia and  headache P:  Management per neurology  Maintain neuro protective measures; goal for eurothermia, euglycemia, eunatermia, normoxia, and PCO2 goal of 35-40 Nutrition and bowel regiment  Seizure precautions  Aspirations precautions  Repeat MRI over Need LP  AKI Superimposed CKD stage IIIb -Hemodialysis initiated 10/23 P: Last HD session 10/27 Nephrology following, appreciate assistance Follow renal function / urine output Trend Bmet Avoid nephrotoxins, ensure adequate renal perfusion   Acute on chronic anemia -Hemoglobin remained stable Thrombocytopenia -Platelet count improved to 163 P: Trend CBC signs of bleeding Transfuse per protocol Hemoglobin goal greater than 7  Type 2 diabetes P: SSI CBG every 4 hours   Best Practice   Per primary   Labs   CBC: Recent Labs  Lab 08/22/21 0504 08/23/21 0443 08/23/21 1223 08/24/21 0430 08/25/21 0407 08/26/21 0435  WBC 10.0 9.2  --  5.2 5.4 7.5  HGB 8.6* 8.5*  --  7.9* 8.1* 9.7*  HCT 25.8* 26.6*  --  25.0* 25.7* 31.0*  MCV 93.5 94.7  --  96.5 95.9 94.5  PLT 82* 94* 97* 90* 114* 829    Basic Metabolic Panel: Recent Labs  Lab 08/20/21 0400 08/20/21 1900 08/21/21 0445 08/22/21 0504 08/23/21 0443 08/24/21 0430 08/25/21 0407 08/26/21 0435  NA 134*   < > 135 136 140 141 143  --   K 6.2*   < > 4.0 3.7 3.7 3.6 3.8  --   CL 106   < > 100 101 102 103 104  --   CO2 14*   < > 21* 24 27 27 29   --   GLUCOSE 146*   < > 126* 149* 130* 96 146*  --   BUN 89*   < > 47* 57* 40* 55* 72*  --   CREATININE 4.63*   < > 3.29* 3.71* 2.81* 3.12* 3.65*  --   CALCIUM 7.2*   < > 8.3* 8.8* 8.3* 8.7* 8.8*  --   MG 2.1  --  1.9 2.0 2.0  --   --  2.0  PHOS  --   --   --  5.3* 3.5 3.8 4.8*  --    < > = values in this interval not displayed.   GFR: Estimated Creatinine Clearance: 12.6 mL/min (A) (by C-G formula based on SCr of 3.65 mg/dL (H)). Recent Labs  Lab 08/23/21 0443 08/24/21 0430 08/25/21 0407 08/26/21 0435  WBC 9.2 5.2 5.4 7.5     Liver Function Tests: Recent Labs  Lab 08/22/21 0504 08/23/21 0443 08/24/21 0430 08/25/21 0407  ALBUMIN 2.4* 2.7* 2.3* 2.4*   No results for input(s): LIPASE, AMYLASE in the last 168 hours. Recent Labs  Lab 08/20/21 0803 08/24/21 1718  AMMONIA 30 25    ABG    Component Value Date/Time   PHART 7.356 08/24/2021 1655   PCO2ART 50.2 (H) 08/24/2021 1655   PO2ART 98.3 08/24/2021 1655   HCO3 26.2 08/24/2021 1655   TCO2 26 12/13/2010 1526   ACIDBASEDEF 3.3 (H) 08/21/2021 1251   O2SAT 96.2 08/24/2021 1655     Coagulation Profile: Recent Labs  Lab 08/20/21 1027 08/23/21 1223  INR 1.6* 1.3*    Cardiac Enzymes: No results for input(s): CKTOTAL, CKMB, CKMBINDEX, TROPONINI in the last 168 hours.  HbA1C: Hemoglobin A1C  Date/Time Value Ref Range Status  07/12/2021 12:00 AM 7.0  Final  04/26/2021 02:13 PM 12.3 (A) 4.0 - 5.6 % Final  03/07/2017 12:00 AM 8.9  Final   Hgb A1c MFr Bld  Date/Time Value Ref Range Status  01/06/2021 09:05 PM 11.0 (H) 4.8 - 5.6 % Final    Comment:    (NOTE) Pre diabetes:          5.7%-6.4%  Diabetes:              >6.4%  Glycemic control for   <7.0% adults with diabetes   05/17/2020 04:45 AM 6.9 (H) 4.8 - 5.6 % Final    Comment:    (NOTE) Pre diabetes:          5.7%-6.4%  Diabetes:              >6.4%  Glycemic control for   <7.0% adults with diabetes     CBG: Recent Labs  Lab 08/25/21 0740 08/25/21 1116 08/25/21 1802 08/25/21 2221 08/26/21 0601  GLUCAP 133* 149* 192* 160* 106*    Review of Systems:   Unable to assess   Past Medical History:  She,  has a past medical history of Acute renal failure superimposed on stage 3 chronic kidney disease ([place]) (01/21/2016), Anemia, Anxiety, B12 deficiency, Brain tumor (benign) ([place]) (10/29/2020), Cellulitis and abscess, CKD (chronic kidney disease) stage 3, GFR 30-59 ml/min (HCC), Closed fracture of distal end of femur, unspecified fracture morphology, initial encounter ([place]),  Depression, Diabetic neuropathy ([place]), DJD (degenerative joint disease), Essential hypertension, Femur fracture, right ([place]) (01/03/1695), Folliculitis, GERD (gastroesophageal reflux disease), Gout, Headache(784.0), Hiatal hernia, History of blood transfusion, History of bronchitis, History of cardiac catheterization, History of kidney stones, Insomnia, Irritable bowel syndrome, Mixed hyperlipidemia, Osteoarthritis, Palpitations, Pneumonia, Reflux esophagitis, Salmonella, Stroke ([place]), Tinnitus, and Type 2 diabetes mellitus ([place]).   Surgical History:   Past Surgical History:  Procedure Laterality Date   BACK SURGERY     c-spine surgery     03/2007   CARDIAC CATHETERIZATION     CHOLECYSTECTOMY     COLONOSCOPY  04/21/2011; 05/29/11   6/12: [place] - ?AVM at IC valve, inflammatory changes at IC valve [facility]); 7/12 - [facility]; IC valve erosions, look chronic and probably Crohn's per path   colonoscopy  2017   [place]: random biopsies normal. TI normal   ESOPHAGOGASTRODUODENOSCOPY  05/29/11   normal   ESOPHAGOGASTRODUODENOSCOPY N/A 01/21/2016   Dr. Michail Sermon: normal   EYE SURGERY Bilateral    cataracts removed   FEMUR IM NAIL Right 04/10/2018   Procedure: INTRAMEDULLARY (IM) NAIL FEMORAL;  Surgeon: Paralee Cancel, MD;  Location: [facility];  Service: Orthopedics;  Laterality: Right;   FRACTURE SURGERY     GIVENS CAPSULE STUDY  11/17/2020   HARDWARE REMOVAL Right 04/10/2018   Procedure: HARDWARE REMOVAL RIGHT DISTAL FEMUR;  Surgeon: Paralee Cancel, MD;  Location: [facility];  Service: Orthopedics;  Laterality: Right;   HERNIA REPAIR     IR FLUORO GUIDE CV LINE RIGHT  04/01/2017   IR US GUIDE VASC ACCESS RIGHT  04/01/2017   JOINT REPLACEMENT Right    hip   OPEN REDUCTION INTERNAL FIXATION (ORIF) DISTAL RADIAL FRACTURE Right 05/28/2019   Procedure: OPEN REDUCTION INTERNAL FIXATION (ORIF) DISTAL RADIAL FRACTURE;  Surgeon: Verner Mould, MD;  Location: [facility];  Service: Orthopedics;   Laterality: Right;  with block 90 minutes   ORIF FEMUR FRACTURE Right 03/31/2017   Procedure: OPEN REDUCTION INTERNAL FIXATION (ORIF) DISTAL FEMUR FRACTURE;  Surgeon: Paralee Cancel, MD;  Location: [facility];  Service: Orthopedics;  Laterality: Right;   removal of kidney stone     Right knee arthroscopic surgery     TONSILLECTOMY     TOTAL ABDOMINAL HYSTERECTOMY  Social History:   reports that she has never smoked. She has never used smokeless tobacco. She reports that she does not drink alcohol and does not use drugs.   Family History:  Her family history includes Colon cancer in her brother; Diabetes in her mother. There is no history of Esophageal cancer, Rectal cancer, or Stomach cancer.   Allergies Allergies  Allergen Reactions   Duloxetine Hcl Swelling   Fluocinolone Swelling   Acetaminophen Itching and Swelling    Pt states "some swelling"   Amlodipine Besylate Swelling    Swelling of feet, fluid retention   Ciprofloxacin Nausea And Vomiting and Swelling    Swelling of face, jaw, and lips   Hydrocodone-Acetaminophen Itching   Metformin Other (See Comments)    REACTION: GI UPSET   Nsaids Other (See Comments)    "HAS BLEEDING ULCERS"   Quinolones Swelling   Sulfamethoxazole Swelling    Swelling of feet, legs   Valsartan Swelling   Cozaar [Losartan] Swelling and Rash   Lisinopril Swelling and Rash    Oral swelling, and red streaks on arms/stomach   Tape Itching and Rash    Paper tape can only be used on this patient     Home Medications  Prior to Admission medications   Medication Sig Start Date End Date Taking? Authorizing Provider  acetaminophen (TYLENOL) 650 MG CR tablet Take 650 mg by mouth every 8 (eight) hours as needed for pain.   Yes [provider]  Adalimumab (HUMIRA PEN) 40 MG/0.4ML PNKT Inject 40 mg into the skin every 14 (fourteen) days. 08/15/21  Yes Gatha Mayer, MD  albuterol (VENTOLIN HFA) 108 (90 Base) MCG/ACT inhaler Inhale 1-2 puffs  into the lungs every 6 (six) hours as needed for shortness of breath or wheezing. 02/16/21 02/16/22 Yes [provider]  allopurinol (ZYLOPRIM) 100 MG tablet Take 100 mg by mouth daily. 03/20/21  Yes [provider]  Ascorbic Acid (VITAMIN C WITH ROSE HIPS) 500 MG tablet Take 500 mg by mouth every morning.   Yes [provider]  atorvastatin (LIPITOR) 80 MG tablet Take 80 mg by mouth every morning.   Yes [provider]  Cholecalciferol (VITAMIN D) 2000 units CAPS Take 2,000 Units by mouth every morning.    Yes [provider]  citalopram (CELEXA) 20 MG tablet Take 1 tablet (20 mg total) by mouth daily. 01/12/21  Yes Barton Dubois, MD  cloNIDine (CATAPRES) 0.2 MG tablet Take 0.2 mg by mouth 2 (two) times daily.   Yes [provider]  cyanocobalamin (,VITAMIN B-12,) 1000 MCG/ML injection Inject 1,000 mcg into the muscle every 30 (thirty) days.     Yes [provider]  dicyclomine (BENTYL) 20 MG tablet Take 1 tablet (20 mg total) by mouth 3 (three) times daily before meals. Patient taking differently: Take 20 mg by mouth in the morning and at bedtime. 01/12/21  Yes Barton Dubois, MD  epoetin alfa-epbx (RETACRIT) 3000 UNIT/ML injection Inject 3,000 Units into the skin every 14 (fourteen) days. 11/21/20  Yes Bhutani, Manpreet S, MD  esomeprazole (NEXIUM) 40 MG capsule Take 40 mg by mouth daily before breakfast.     Yes [provider]  fenofibrate (TRICOR) 145 MG tablet Take 1 tablet (145 mg total) by mouth daily. 09/07/20  Yes BranchAlphonse Guild, MD  furosemide (LASIX) 40 MG tablet Take 40 mg by mouth in the morning, at noon, and at bedtime.   Yes [provider]  gabapentin (NEURONTIN) 300 MG capsule  Take 300 mg by mouth in the morning and at bedtime. *May take one additional capsule as needed for pain 10/20/20  Yes [provider]  HYDROmorphone (DILAUDID) 2 MG tablet Take 0.5 tablets (1 mg total) by mouth every 4  (four) hours as needed for severe pain. 05/18/21  Yes Isla Pence, MD  indomethacin (INDOCIN) 50 MG capsule Take 50 mg by mouth 3 (three) times daily. 07/05/21  Yes [provider]  insulin detemir (LEVEMIR FLEXTOUCH) 100 UNIT/ML FlexPen Inject 70 Units into the skin at bedtime. 08/03/21  Yes Brita Romp, NP  Insulin Lispro (HUMALOG KWIKPEN ) Inject 20-26 Units into the skin 3 (three) times daily with meals. Sliding scale   Yes [provider]  lipase/protease/amylase (CREON) 36000 UNITS CPEP capsule Take 1 capsule (36,000 Units total) by mouth with snacks. Patient taking differently: Take 36,000-72,000 Units by mouth See admin instructions. Takes 2 capsules with all meals and takes one capsule with snacks 01/12/21  Yes Barton Dubois, MD  meclizine (ANTIVERT) 25 MG tablet Take 1 tablet by mouth as needed for dizziness or nausea.   Yes [provider]  metoprolol tartrate (LOPRESSOR) 100 MG tablet Take 100 mg by mouth 2 (two) times daily. 05/07/21  Yes [provider]  omega-3 acid ethyl esters (LOVAZA) 1 g capsule Take 3 capsules by mouth 3 (three) times daily. 10/11/20  Yes [provider]  potassium chloride SA (K-DUR,KLOR-CON) 20 MEQ tablet Take 20 mEq by mouth every morning.   Yes [provider]  promethazine (PHENERGAN) 25 MG tablet Take 1 tablet (25 mg total) by mouth every 6 (six) hours as needed for nausea or vomiting. 05/18/21  Yes Isla Pence, MD  temazepam (RESTORIL) 15 MG capsule Take 15 mg by mouth at bedtime as needed for sleep. 04/14/21  Yes [provider]  tiZANidine (ZANAFLEX) 4 MG tablet Take 8 mg by mouth at bedtime. 09/06/20  Yes [provider]  venlafaxine XR (EFFEXOR-XR) 150 MG 24 hr capsule Take 150 mg by mouth daily. 04/27/21  Yes [provider]  Adalimumab (HUMIRA PEN) 40 MG/0.4ML PNKT Inject 40 mg into the skin every 14 days Patient not taking: No sig reported 08/15/21   Gatha Mayer, MD  diphenoxylate-atropine (LOMOTIL) 2.5-0.025 MG tablet 1-2 tablets qid prn diarrhea Instead of Imodium Patient not taking: No sig reported 12/06/20   Gatha Mayer, MD  hydrALAZINE (APRESOLINE) 25 MG tablet Take 1 tablet (25 mg total) by mouth 3 (three) times daily. Patient not taking: No sig reported 05/18/21 08/16/21  Isla Pence, MD     Critical care time: NA  Demia Viera D. Kenton Kingfisher, NP-C [facility] Personal contact information can be found on Amion  08/26/2021, 9:27 AM

## 2021-08-26 NOTE — Consult Note (Signed)
Neurology Consultation Reason for Consult: Altered mental status Referring Physician: Roderic Palau, J  CC: Altered mental status  History is obtained from: Chart review  HPI: Heather [name] is a 70 y.o. female with a history of B12 deficiency, small right frontal meningioma, diabetes, CKD who presented on 1019 with headache and blurred vision.  At the time she had a severely elevated blood pressure in the 190s, CKD and was found to have pneumonia versus CHF.  Throughout this whole hospitalization, she has had markedly labile blood pressures vacillating from the 230s to the low 100s, frequently higher than 200, with the lowest being 61/45 on 10/22, after which she was started on midodrine.  She has also been found to be severely hypomagnesemic.  ESR was markedly elevated.   Her mental status has declined over the course of the hospitalization and she was seen by Dr. Merlene Laughter who initially felt that she had a multifactorial encephalopathy secondary to her multiple medical issues, but noted that she had significantly worsened when he saw her yesterday.  He began weaning Effexor to try and reduce psychoactive medications.     ROS: A 14 point ROS was performed and is negative except as noted in the HPI.   Past Medical History:  Diagnosis Date   Acute renal failure superimposed on stage 3 chronic kidney disease ([place]) 01/21/2016   Anemia    Anxiety    B12 deficiency    Brain tumor (benign) ([place]) 10/29/2020   Cellulitis and abscess    Abdomen and buttocks   CKD (chronic kidney disease) stage 3, GFR 30-59 ml/min (HCC)    Closed fracture of distal end of femur, unspecified fracture morphology, initial encounter ([place])    Depression    Diabetic neuropathy (HCC)    feet   DJD (degenerative joint disease)    Right forminal stenosis C4-5   Essential hypertension    Femur fracture, right ([place]) 3/0/1601   Folliculitis    GERD (gastroesophageal reflux disease)    Gout    Headache(784.0)    Hiatal  hernia    History of blood transfusion    History of bronchitis    History of cardiac catheterization    No significant CAD 2012   History of kidney stones    surgery to remove   Insomnia    Irritable bowel syndrome    Mixed hyperlipidemia    Osteoarthritis    Palpitations    Pneumonia    Reflux esophagitis    Salmonella    Stroke [facility])    Tinnitus    Right   Type 2 diabetes mellitus (HCC)      Family History  Problem Relation Age of Onset   Diabetes Mother    Colon cancer Brother        POSSIBLY. Patient states diagnosed at age 73, then later states age 89. unclear and limited historian   Esophageal cancer Neg Hx    Rectal cancer Neg Hx    Stomach cancer Neg Hx      Social History:  reports that she has never smoked. She has never used smokeless tobacco. She reports that she does not drink alcohol and does not use drugs.   Exam: Current vital signs: BP (!) 159/73 (BP Location: Right Arm)   Pulse 93   Temp 99 F (37.2 C) (Axillary)   Resp 15   Ht _0  (1.626 m)   Wt 64.2 kg   LMP  (LMP Unknown)   SpO2 97%   BMI  24.29 kg/m  Vital signs in last 24 hours: Temp:  [98.1 F (36.7 C)-99.2 F (37.3 C)] 99 F (37.2 C) (10/29 0211) Pulse Rate:  [92-115] 93 (10/29 0244) Resp:  [13-28] 15 (10/29 0244) BP: (103-231)/(42-136) 159/73 (10/29 0244) SpO2:  [94 %-100 %] 97 % (10/29 0211) Weight:  [64.2 kg-66.9 kg] 64.2 kg (10/29 0050)   Physical Exam  Constitutional: Appears well-developed and well-nourished.  Psych: Affect appropriate to situation Eyes: No scleral injection HENT: No OP obstruction MSK: no joint deformities.  Cardiovascular: Normal rate and regular rhythm.  Respiratory: Effort normal, non-labored breathing GI: Soft.  No distension. There is no tenderness.  Skin: WDI  Neuro: Mental Status: Patient is letharigc but arousable.  With repeated prompting, she is able to tell me her name, says things like "leave me alone."  She does follow commands to  show two fingers and wiggle toes. Cranial Nerves: II: She does not clearly blink to threat from either direction pupils are equal, round, and reactive to light.   III,IV, VI: She keeps her eyes midline, and actively resists doll's eye maneuver Motor: She moves all extremities, though she localizes more briskly on the left than right Sensory: She response to noxious stimulation bilaterally Cerebellar: She does not perform     I have reviewed labs in epic and the results pertinent to this consultation are: Ammonia from 10/27-2025 TSH 0.74 ESR 127  I have reviewed the images obtained: MRI brain-bilateral old appearing GPI insults, I have seen these most commonly in the setting of overdose, but could also be seen in carbon dioxide poisoning among other toxic/metabolic insults  Impression: 70 year old female presenting with headaches, blurred vision, severe hypertension, AKI, hypomagnesemia with CHF versus pneumonia on cefepime with progressive worsening of her mental status.  She certainly has multiple medical issues which could be contributing to her delirium such as pneumonia, hypoxic respiratory failure, severe hypertension and so my suspicion and hope remains that this represents a delirium associated with her medical condition.  With her labile blood pressures, as well as her renal dysfunction she is certainly at risk for posterior reversible encephalopathy syndrome(PRES).  Seizures would be another possibility, though I think that this is less likely.    [place]hed infarcts would be a significant consideration.   With a history of B12 deficiency, I do think checking for B1 with repletion once checked would be prudent, though my suspicion for this is relatively low.  Cefepime neurotoxicity is a possibility, though she has not been aon a very high dose, I would favor changing to a different agent.   If no other causes found, need to consider LP, but her last INR was too high to safely  do it, will re-check.   Recommendations: 1) MRI brain 2) EEG 3) thiamine 592m IV daily after checking level.  4) lumbar puncture.  5) Would not reduce venlafaxine further at the current time.  6) change cefepime to another agent if possible.    MRoland Rack MD [facility] [phone] If 7pm- 7am, please page neurology on call as listed in [place]

## 2021-08-26 NOTE — Progress Notes (Signed)
Pharmacy Antibiotic Note  Heather [name] is a 70 y.o. female admitted on 08/16/2021 with pneumonia.  Pharmacy has been consulted for Vancomycin dosing. Noted renal dysfunction. CXR with likely multi-focal pneumonia.Patient with positive MRSA PCR. Patient was dialyzed once. Nephrology hoping for renal recovery. Patient completed 3 days of linezolid in an attempt to prevent further renal dysfunction. Pharmacy not consulted to dose vancomycin. Patient remains afebrile. WBC WNL. Will give loading dose of vancomycin then dose per levels. First level scheduled for AM  of 10/31.  Plan: Continue Ceftriaxone 2g IV q24h Give loading dose of vancomycin 1250 mg today F/u duration of treatment, renal function, and plans to start dialysis  Height: 5\' 4"  (162.6 cm) Weight: 63.9 kg (140 lb 14 oz) IBW/kg (Calculated) : 54.7  Temp (24hrs), Avg:98.7 F (37.1 C), Min:98.1 F (36.7 C), Max:99.2 F (37.3 C)  Recent Labs  Lab 08/21/21 0445 08/22/21 0504 08/23/21 0443 08/24/21 0430 08/25/21 0407 08/26/21 0435  WBC 11.2* 10.0 9.2 5.2 5.4 7.5  CREATININE 3.29* 3.71* 2.81* 3.12* 3.65*  --      Estimated Creatinine Clearance: 12.6 mL/min (A) (by C-G formula based on SCr of 3.65 mg/dL (H)).    Allergies  Allergen Reactions   Duloxetine Hcl Swelling   Fluocinolone Swelling   Acetaminophen Itching and Swelling    Pt states "some swelling"   Amlodipine Besylate Swelling    Swelling of feet, fluid retention   Ciprofloxacin Nausea And Vomiting and Swelling    Swelling of face, jaw, and lips   Hydrocodone-Acetaminophen Itching   Metformin Other (See Comments)    REACTION: GI UPSET   Nsaids Other (See Comments)    "HAS BLEEDING ULCERS"   Quinolones Swelling   Sulfamethoxazole Swelling    Swelling of feet, legs   Valsartan Swelling   Cozaar [Losartan] Swelling and Rash   Lisinopril Swelling and Rash    Oral swelling, and red streaks on arms/stomach   Tape Itching and Rash    Paper tape can  only be used on this patient   Antimicrobials: Vanco 10/22 >> 10/25, 10/29>> Cefepime 10/22 >>10/28 Linezolid 10/25>> x 3 days  Cultures: 10/22 Bcx: ngtd 10/20 Ucx: ng 10/24 MRSA PCR: positive 10/29 20 pathogen respiratory panel negative  Thank you for allowing pharmacy to participate in this patient's care.  Reatha Harps, PharmD PGY1 Pharmacy Resident 08/26/2021 2:31 PM Check [email or web address] for unit specific pharmacy number

## 2021-08-27 ENCOUNTER — Inpatient Hospital Stay (HOSPITAL_COMMUNITY): Payer: 59

## 2021-08-27 DIAGNOSIS — R918 Other nonspecific abnormal finding of lung field: Secondary | ICD-10-CM | POA: Diagnosis not present

## 2021-08-27 DIAGNOSIS — H538 Other visual disturbances: Secondary | ICD-10-CM | POA: Diagnosis not present

## 2021-08-27 LAB — LIPID PANEL
Cholesterol: 107 mg/dL (ref 0–200)
HDL: 32 mg/dL — ABNORMAL LOW (ref >40)
LDL Cholesterol: 40 mg/dL (ref 0–99)
Total CHOL/HDL Ratio: 3.3 RATIO
Triglycerides: 175 mg/dL — ABNORMAL HIGH (ref <150)
VLDL: 35 mg/dL (ref 0–40)

## 2021-08-27 LAB — PHOSPHORUS: Phosphorus: 3.4 mg/dL (ref 2.5–4.6)

## 2021-08-27 LAB — RENAL FUNCTION PANEL
Albumin: 2.3 g/dL — ABNORMAL LOW (ref 3.5–5.0)
Anion gap: 14 (ref 5–15)
BUN: 76 mg/dL — ABNORMAL HIGH (ref 8–23)
CO2: 29 mmol/L (ref 22–32)
Calcium: 9.3 mg/dL (ref 8.9–10.3)
Chloride: 101 mmol/L (ref 98–111)
Creatinine, Ser: 3.37 mg/dL — ABNORMAL HIGH (ref 0.44–1.00)
GFR, Estimated: 14 mL/min — ABNORMAL LOW (ref >60)
Glucose, Bld: 220 mg/dL — ABNORMAL HIGH (ref 70–99)
Phosphorus: 3.4 mg/dL (ref 2.5–4.6)
Potassium: 2.8 mmol/L — ABNORMAL LOW (ref 3.5–5.1)
Sodium: 144 mmol/L (ref 135–145)

## 2021-08-27 LAB — CBC
HCT: 29.4 % — ABNORMAL LOW (ref 36.0–46.0)
Hemoglobin: 9.2 g/dL — ABNORMAL LOW (ref 12.0–15.0)
MCH: 29.5 pg (ref 26.0–34.0)
MCHC: 31.3 g/dL (ref 30.0–36.0)
MCV: 94.2 fL (ref 80.0–100.0)
Platelets: 178 10*3/uL (ref 150–400)
RBC: 3.12 MIL/uL — ABNORMAL LOW (ref 3.87–5.11)
RDW: 15.7 % — ABNORMAL HIGH (ref 11.5–15.5)
WBC: 8.3 10*3/uL (ref 4.0–10.5)
nRBC: 0 % (ref 0.0–0.2)

## 2021-08-27 LAB — GLUCOSE, CAPILLARY
Glucose-Capillary: 136 mg/dL — ABNORMAL HIGH (ref 70–99)
Glucose-Capillary: 153 mg/dL — ABNORMAL HIGH (ref 70–99)
Glucose-Capillary: 178 mg/dL — ABNORMAL HIGH (ref 70–99)
Glucose-Capillary: 196 mg/dL — ABNORMAL HIGH (ref 70–99)
Glucose-Capillary: 204 mg/dL — ABNORMAL HIGH (ref 70–99)

## 2021-08-27 LAB — MAGNESIUM: Magnesium: 1.6 mg/dL — ABNORMAL LOW (ref 1.7–2.4)

## 2021-08-27 LAB — CK: Total CK: 71 U/L (ref 38–234)

## 2021-08-27 LAB — HEMOGLOBIN A1C
Hgb A1c MFr Bld: 6.1 % — ABNORMAL HIGH (ref 4.8–5.6)
Mean Plasma Glucose: 128.37 mg/dL

## 2021-08-27 LAB — VITAMIN B12: Vitamin B-12: 1417 pg/mL — ABNORMAL HIGH (ref 180–914)

## 2021-08-27 MED ORDER — LORAZEPAM 2 MG/ML IJ SOLN: 2.0000 mg | Freq: Once | INTRAMUSCULAR | Status: DC

## 2021-08-27 MED ORDER — POTASSIUM CHLORIDE 20 MEQ PO PACK
40.0000 meq | PACK | Freq: Once | ORAL | Status: DC
  Filled 2021-08-27: qty 2

## 2021-08-27 MED ORDER — MAGNESIUM SULFATE 2 GM/50ML IV SOLN
2.0000 g | Freq: Once | INTRAVENOUS | Status: AC
  Administered 2021-08-27: 2 g via INTRAVENOUS
  Filled 2021-08-27: qty 50

## 2021-08-27 MED ORDER — POTASSIUM CHLORIDE 10 MEQ/100ML IV SOLN
10.0000 meq | INTRAVENOUS | Status: AC
  Administered 2021-08-27 (×4): 10 meq via INTRAVENOUS
  Filled 2021-08-27 (×4): qty 100

## 2021-08-27 NOTE — Plan of Care (Signed)
  Problem: Education: Goal: Knowledge of General Education information will improve Description: Including pain rating scale, medication(s)/side effects and non-pharmacologic comfort measures Outcome: Progressing   Problem: Health Behavior/Discharge Planning: Goal: Ability to manage health-related needs will improve Outcome: Progressing   Problem: Clinical Measurements: Goal: Ability to maintain clinical measurements within normal limits will improve Outcome: Progressing Goal: Will remain free from infection Outcome: Progressing Goal: Diagnostic test results will improve Outcome: Progressing Goal: Respiratory complications will improve Outcome: Progressing Goal: Cardiovascular complication will be avoided Outcome: Progressing   Problem: Activity: Goal: Risk for activity intolerance will decrease Outcome: Progressing   Problem: Nutrition: Goal: Adequate nutrition will be maintained Outcome: Progressing   Problem: Coping: Goal: Level of anxiety will decrease Outcome: Progressing   Problem: Elimination: Goal: Will not experience complications related to bowel motility Outcome: Progressing Goal: Will not experience complications related to urinary retention Outcome: Progressing   Problem: Pain Managment: Goal: General experience of comfort will improve Outcome: Progressing   Problem: Safety: Goal: Ability to remain free from injury will improve Outcome: Progressing   Problem: Skin Integrity: Goal: Risk for impaired skin integrity will decrease Outcome: Progressing   Problem: Education: Goal: Knowledge of disease or condition will improve Outcome: Progressing   Problem: Cardiac: Goal: Ability to achieve and maintain adequate cardiopulmonary perfusion will improve Outcome: Progressing   Problem: Health Behavior/Discharge Planning: Goal: Ability to safely manage health-related needs after discharge will improve Outcome: Progressing

## 2021-08-27 NOTE — Progress Notes (Signed)
Neurology Progress Note  Brief HPI per neurology consult on 08/26/21:  "Heather [name] is a 70 y.o. female with a history of B12 deficiency, small right frontal meningioma, diabetes, CKD who presented on 10/19 with headache and blurred vision.  At the time she had a severely elevated blood pressure in the 190s, CKD and was found to have pneumonia versus CHF.  Throughout this whole hospitalization, she has had markedly labile blood pressures vacillating from the 230s to the low 100s, frequently higher than 200, with the lowest being 61/45 on 10/22, after which she was started on midodrine.  She has also been found to be severely hypomagnesemic.  ESR was markedly elevated.   Her mental status has declined over the course of the hospitalization and she was seen by Dr. Merlene Laughter who initially felt that she had a multifactorial encephalopathy secondary to her multiple medical issues, but noted that she had significantly worsened when he saw her yesterday.  He began weaning Effexor to try and reduce psychoactive medications."  S: States she is cold when NP removes blanket off her legs for exam. Says "is that your trash in here?" Robust ROS can not be performed due to mental status.   O: Current vital signs: BP (!) 147/93 (BP Location: Left Arm)   Pulse 80   Temp (!) 97.5 F (36.4 C) (Axillary)   Resp 17   Ht '5\' 4"'  (1.626 m)   Wt 67.3 kg   LMP  (LMP Unknown)   SpO2 100%   BMI 25.47 kg/m  Vital signs in last 24 hours: Temp:  [97.5 F (36.4 C)-98.8 F (37.1 C)] 97.5 F (36.4 C) (10/30 9833) Pulse Rate:  [80-92] 80 (10/30 0832) Resp:  [17-20] 17 (10/30 0832) BP: (112-201)/(50-93) 147/93 (10/30 0832) SpO2:  [97 %-100 %] 100 % (10/30 0832) Weight:  [67.3 kg] 67.3 kg (10/30 0520)  GENERAL: Chronically ill  appearing female. Non toxic. Arouses to repeated name calling.  HEENT: Normocephalic and atraumatic. Mucous membranes dry.  LUNGS: Normal respiratory effort.  CV: RRR on tele.  Ext:  warm.  NEURO:  Mental Status: Lethargic, but arouses with repeated name calling. Follows some commands (protrude tongue, squeeze hands, wiggle toes) but not others. Oriented to name and place, but not age, year, day, date, or month.  Speech/Language: Fluent, no aphasia or dysarthria. Bradyphrenic.   Cranial Nerves:  PERRL. Closes eyes most of exam. Unable to fully assess EOMI and vision due to lethargy. Face is grossly symmetrical. Hearing intact to voice. Tongue is midline without fasciculations. Moves all four extremities spontaneously. ABle to lift UEs, but not hold x 10 seconds. Bilateral grips at 3+/4. She does resist bending her arm and moving it, but she is not rigid. Dorsiflexion/Plantar flexion poor effort. Able to wiggle toes.  BUE:  brachioradialis  1    BLE:  patella  2     Medications  Current Facility-Administered Medications:    0.9 %  sodium chloride infusion, 100 mL, Intravenous, PRN, Joelyn Oms, Ryan B, MD   0.9 %  sodium chloride infusion, 100 mL, Intravenous, PRN, Joelyn Oms, Ryan B, MD   0.9 %  sodium chloride infusion, 250 mL, Intravenous, PRN, Donato Heinz, MD, Last Rate: 10 mL/hr at 08/26/21 1235, 250 mL at 08/26/21 1235   acetaminophen (TYLENOL) tablet 650 mg, 650 mg, Oral, Q6H PRN, Kathie Dike, MD, 650 mg at 08/25/21 1632   allopurinol (ZYLOPRIM) tablet 100 mg, 100 mg, Oral, Daily, Emokpae, Ejiroghene E, MD, 100 mg at 08/26/21 [phone]  atorvastatin (LIPITOR) tablet 80 mg, 80 mg, Oral, QHS, Manuella Ghazi, Pratik D, DO, 80 mg at 08/25/21 2234   cefTRIAXone (ROCEPHIN) 2 g in sodium chloride 0.9 % 100 mL IVPB, 2 g, Intravenous, Q24H, Shawna Clamp, MD, Last Rate: 200 mL/hr at 08/26/21 1239, 2 g at 08/26/21 1239   Chlorhexidine Gluconate Cloth 2 % PADS 6 each, 6 each, Topical, Daily, Adefeso, Oladapo, DO, 6 each at 08/26/21 0928   diltiazem (CARDIZEM) tablet 60 mg, 60 mg, Oral, Q8H, Memon, Jolaine Artist, MD, 60 mg at 08/27/21 0801   diphenhydrAMINE (BENADRYL) capsule 25 mg, 25 mg,  Oral, Q6H PRN, Barb Merino, MD, 25 mg at 08/20/21 0010   diphenoxylate-atropine (LOMOTIL) 2.5-0.025 MG per tablet 1 tablet, 1 tablet, Oral, QID PRN, Barb Merino, MD   haloperidol lactate (HALDOL) injection 2 mg, 2 mg, Intravenous, Q6H PRN, Manuella Ghazi, Pratik D, DO, 2 mg at 08/27/21 0220   hydrALAZINE (APRESOLINE) injection 10 mg, 10 mg, Intravenous, Q4H PRN, Harris, Whitney D, NP   hydrALAZINE (APRESOLINE) tablet 50 mg, 50 mg, Oral, Q8H, Harris, Whitney D, NP, 50 mg at 08/27/21 0802   HYDROmorphone (DILAUDID) injection 0.5 mg, 0.5 mg, Intravenous, Q4H PRN, Barb Merino, MD, 0.5 mg at 08/25/21 2000   HYDROmorphone (DILAUDID) tablet 4 mg, 4 mg, Oral, Q4H PRN, Barb Merino, MD, 4 mg at 08/20/21 0045   insulin aspart (novoLOG) injection 0-9 Units, 0-9 Units, Subcutaneous, TID WC, Emokpae, Ejiroghene E, MD, 2 Units at 08/27/21 0802   insulin detemir (LEVEMIR) injection 15 Units, 15 Units, Subcutaneous, QHS, Ghimire, Dante Gang, MD, 15 Units at 08/27/21 0032   ipratropium-albuterol (DUONEB) 0.5-2.5 (3) MG/3ML nebulizer solution 3 mL, 3 mL, Nebulization, Q6H PRN, Manuella Ghazi, Pratik D, DO   labetalol (NORMODYNE) tablet 200 mg, 200 mg, Oral, BID, Edrick Oh, MD, 200 mg at 08/26/21 2330   lipase/protease/amylase (CREON) capsule 36,000-72,000 Units, 36,000-72,000 Units, Oral, See admin instructions, Emokpae, Ejiroghene E, MD, 24,000 Units at 08/18/21 1706   LORazepam (ATIVAN) injection 1 mg, 1 mg, Intravenous, Q4H PRN, Kathie Dike, MD, 1 mg at 08/27/21 0354   magnesium sulfate IVPB 2 g 50 mL, 2 g, Intravenous, Once, Shawna Clamp, MD   meclizine (ANTIVERT) tablet 25 mg, 25 mg, Oral, PRN, Barb Merino, MD, 25 mg at 08/19/21 0620   mupirocin ointment (BACTROBAN) 2 % 1 application, 1 application, Nasal, BID, Kathie Dike, MD, 1 application at 09/38/18 2341   ondansetron (ZOFRAN) tablet 4 mg, 4 mg, Oral, Q6H PRN, 4 mg at 08/19/21 2151 **OR** ondansetron (ZOFRAN) injection 4 mg, 4 mg, Intravenous, Q6H PRN,  Emokpae, Ejiroghene E, MD, 4 mg at 08/19/21 0410   pantoprazole (PROTONIX) EC tablet 40 mg, 40 mg, Oral, Daily, Emokpae, Ejiroghene E, MD, 40 mg at 08/26/21 0927   potassium chloride (KLOR-CON) packet 40 mEq, 40 mEq, Oral, Once, Shawna Clamp, MD   potassium chloride 10 mEq in 100 mL IVPB, 10 mEq, Intravenous, Q1 Hr x 4, Shawna Clamp, MD, Last Rate: 100 mL/hr at 08/27/21 0849, 10 mEq at 08/27/21 0849   sodium bicarbonate tablet 650 mg, 650 mg, Oral, TID, Rosita Fire, MD, 650 mg at 08/26/21 2331   sodium chloride flush (NS) 0.9 % injection 3 mL, 3 mL, Intravenous, Q12H, Coladonato, Broadus John, MD, 3 mL at 08/26/21 2341   sodium chloride flush (NS) 0.9 % injection 3 mL, 3 mL, Intravenous, PRN, Donato Heinz, MD   temazepam (RESTORIL) capsule 15 mg, 15 mg, Oral, QHS PRN, Emokpae, Ejiroghene E, MD, 15 mg at 08/21/21 2144   thiamine 545m  in normal saline (23m) IVPB, 500 mg, Intravenous, Q24H, KGreta Doom MD, Last Rate: 100 mL/hr at 08/26/21 1622, 500 mg at 08/26/21 1622   venlafaxine XR (EFFEXOR-XR) 24 hr capsule 75 mg, 75 mg, Oral, Daily, [place], Kofi, MD, 75 mg at 08/26/21 0927  Pertinent Labs Ammonia 25. K 2.8. Mg 1.6. Glucose 220. INR down to 1.2.   No new imaging  EEG 08/26/21.  MD has reviewed images in epic and the results pertinent to this consultation are: Impression and clinical correlation: This EEG was obtained while awake and drowsy and is abnormal due to mild diffuse slowing indicative of global cerebral dysfunction. No epileptiform abnormalities were observed during this recording.  Assessment: 70year old female presenting with headaches, blurred vision, severe hypertension, AKI, hypomagnesemia with CHF versus pneumonia on cefepime with progressive worsening of her mental status.  She certainly has multiple medical issues which could be contributing to her delirium such as pneumonia, hypoxic respiratory failure, severe hypertension and so my suspicion and  hope remains that this represents a delirium associated with her medical condition. Her EEG was negative for seizures, so do not suspect subclinical seizures. Her encephalopathy is thought to be due to metabolic derangements, chronic illnesses, labile BPs (although improved), and Cefepime toxicity. Will discuss LP with attending, but doubt her encephalopathy is due to CNS infection due to no fever and improvement on exam today.   Recommendations/Plan:  -No need to wean Effexor further.  -It will take time to wake up if Cefepime was the culprit.  -Appreciate change of antibiotic.  -recheck Vitamin B12 level given history of deficiency. Supplement is level is not over 400.  - Seems to be showing spontaneous improvement, so will hold off on LP for now.  Pt seen by KClance Boll MSN, APN-BC/Nurse Practitioner/Neuro and later by MD. Note and plan to be edited as needed by MD.  Pager: [phone]  NEUROHOSPITALIST ADDENDUM Performed a face to face diagnostic evaluation.   I have reviewed the contents of history and physical exam as documented by PA/ARNP/Resident and agree with above documentation.  I have discussed and formulated the above plan as documented. Edits to the note have been made as needed.  Encephalopathy likely multifactorial from CHF vs Pneumonia, Cefepime Neurotoxicity, labile BP and AKI. Seems to be getting better per discussion with bedside RN. Was given ativan 275mabout an hour prior to my evaluation to get an MRI Brain. She is somnolent on my evaluation but oriented to self, month and place. She is non focal on exam and moves all extremities. Can give me a high five with both hands and lift her legs up.  Reviewed MRI Brain which demonstrated small acute infracts in the left cerebellum, left frontal operculum. The R frontal operculum stroke appears cortical and probably embolic. Will get stroke workup.  SaDonnetta SimpersMD [facility] [phone] If 7pm to  7am, please call on call as listed on AMION.

## 2021-08-27 NOTE — Consult Note (Signed)
NAME:  Heather [name], MRN:  [id number], DOB:  09-Sep-1951, LOS: 70 ADMISSION DATE:  08/16/2021, CONSULTATION DATE:  08/26/2021 REFERRING MD:  Dr. Dwyane Dee, CHIEF COMPLAINT:  Abnormal CT chest  History of Present Illness:  70 yo female with hx of Crohn's disease on humira as an outpt presented to [facility] with blurred vision, diplopia, and headache.  This was associated with altered mental status and confusion.  She was found to have pulmonary infiltrates with hypoxia.  She has progressive renal dysfunction and nephrology consulted.  Neurology consulted.  PCCM asked to assess respiratory status and whether she needs bronchoscopy.  She was transferred to [facility] for further management.  Pertinent  Medical History  CKD stage IIIb Anemia B12 deficiency Brain tumor Depression Hypertension Insomnia Pneumonia Stroke Type 2 diabetes  Significant Hospital Events: Including procedures, antibiotic start and stop dates in addition to other pertinent events   10/19 admitted for blurred vision and diplopia 10/23 hemodialysis initiated 10/28 transferred to [facility] for further neurological and critical care support  Interim History / Subjective:  Received ativan earlier for MRI.  Objective   Blood pressure (!) 147/73, pulse 78, temperature (!) 97.5 F (36.4 C), temperature source Oral, resp. rate 18, height 5\' 4"  (1.626 m), weight 67.3 kg, SpO2 99 %.        Intake/Output Summary (Last 24 hours) at 08/27/2021 1330 Last data filed at 08/27/2021 0300 Gross per 24 hour  Intake 700.33 ml  Output --  Net 700.33 ml   Filed Weights   08/26/21 0050 08/26/21 0500 08/27/21 0520  Weight: 64.2 kg 63.9 kg 67.3 kg    Examination:  General - somnolent Eyes - pupils reactive ENT - no sinus tenderness, no stridor Cardiac - regular rate/rhythm, no murmur Chest - equal breath sounds b/l, no wheezing or rales Abdomen - soft, non tender, + bowel sounds Extremities - no cyanosis, clubbing, or  edema Skin - no rashes Neuro -follows simple commands  CXR 10/30 - much improved pulmonary infiltrates    Resolved Hospital Problem list     Assessment & Plan:   Acute hypoxic respiratory failure with pulmonary infiltrates in immunocompromised patient. - much improved 10/30 - likely combination of pulmonary edema and pneumonitis - day 9 of ABx >> would compete 10 day course - no indication for bronchoscopy at this time - goal SpO2 > 92%   Acute encephalopathy. - MRI brain 10/30 shows small acute infarcts in Lt cerebellum and Lt frontal operculum - neurology consulted   AKI with CKD 3b. - per primary team and nephrology   HTN emergency. - goal SBP < 180, DBP < 100  PCCM will sign off.  Please call if additional help needed while she is in hospital.  Labs    CMP Latest Ref Rng & Units 08/27/2021 08/25/2021 08/24/2021  Glucose 70 - 99 mg/dL 220(H) 146(H) 96  BUN 8 - 23 mg/dL 76(H) 72(H) 55(H)  Creatinine 0.44 - 1.00 mg/dL 3.37(H) 3.65(H) 3.12(H)  Sodium 135 - 145 mmol/L 144 143 141  Potassium 3.5 - 5.1 mmol/L 2.8(L) 3.8 3.6  Chloride 98 - 111 mmol/L 101 104 103  CO2 22 - 32 mmol/L 29 29 27   Calcium 8.9 - 10.3 mg/dL 9.3 8.8(L) 8.7(L)  Total Protein 6.5 - 8.1 g/dL - - -  Total Bilirubin 0.3 - 1.2 mg/dL - - -  Alkaline Phos 38 - 126 U/L - - -  AST 15 - 41 U/L - - -  ALT 0 - 44 U/L - - -  CBC Latest Ref Rng & Units 08/27/2021 08/26/2021 08/25/2021  WBC 4.0 - 10.5 K/uL 8.3 7.5 5.4  Hemoglobin 12.0 - 15.0 g/dL 9.2(L) 9.7(L) 8.1(L)  Hematocrit 36.0 - 46.0 % 29.4(L) 31.0(L) 25.7(L)  Platelets 150 - 400 K/uL 178 163 114(L)    ABG    Component Value Date/Time   PHART 7.356 08/24/2021 1655   PCO2ART 50.2 (H) 08/24/2021 1655   PO2ART 98.3 08/24/2021 1655   HCO3 26.2 08/24/2021 1655   TCO2 26 12/13/2010 1526   ACIDBASEDEF 3.3 (H) 08/21/2021 1251   O2SAT 96.2 08/24/2021 1655    CBG (last 3)  Recent Labs    08/27/21 0620 08/27/21 0631 08/27/21 1134  GLUCAP  204* 196* 136*    Signature:  Chesley Mires, MD [place] Pager - [phone] 08/27/2021, 1:35 PM

## 2021-08-27 NOTE — Progress Notes (Signed)
[facility] ROUNDING NOTE   Subjective:   Transferred from [facility] to [facility] for additional neurology input given ongoing AMS.  UOP yesterday was not recorded - appears purewick dysfunction and she's incontinent She isn't able to answer any questions on my exam.  She's in mitten restraints.  Just returned from MRI.   Objective:  Vital signs in last 24 hours:  Temp:  [97.5 F (36.4 C)-98.8 F (37.1 C)] 97.5 F (36.4 C) (10/30 0832) Pulse Rate:  [80-92] 80 (10/30 0832) Resp:  [17-20] 17 (10/30 0832) BP: (112-188)/(50-93) 147/93 (10/30 0832) SpO2:  [97 %-100 %] 100 % (10/30 0832) Weight:  [67.3 kg] 67.3 kg (10/30 0520)  Weight change: 3.1 kg Filed Weights   08/26/21 0050 08/26/21 0500 08/27/21 0520  Weight: 64.2 kg 63.9 kg 67.3 kg    Intake/Output: I/O last 3 completed shifts: In: 1326.5 [P.O.:980; I.V.:196.5; IV Piggyback:150] Out: 1100 [Urine:1100]   Intake/Output this shift:  No intake/output data recorded.  CVS- RRR RS- CTA ABD- BS present soft non-distended EXT- no edema Neuro - arousable and mumbling but cannot answer questions  GU - incontinent  Basic Metabolic Panel: Recent Labs  Lab 08/21/21 0445 08/22/21 0504 08/23/21 0443 08/24/21 0430 08/25/21 0407 08/26/21 0435 08/27/21 0400  NA 135 136 140 141 143  --  144  K 4.0 3.7 3.7 3.6 3.8  --  2.8*  CL 100 101 102 103 104  --  101  CO2 21* 24 27 27 29   --  29  GLUCOSE 126* 149* 130* 96 146*  --  220*  BUN 47* 57* 40* 55* 72*  --  76*  CREATININE 3.29* 3.71* 2.81* 3.12* 3.65*  --  3.37*  CALCIUM 8.3* 8.8* 8.3* 8.7* 8.8*  --  9.3  MG 1.9 2.0 2.0  --   --  2.0 1.6*  PHOS  --  5.3* 3.5 3.8 4.8*  --  3.4  3.4     Liver Function Tests: Recent Labs  Lab 08/22/21 0504 08/23/21 0443 08/24/21 0430 08/25/21 0407 08/27/21 0400  ALBUMIN 2.4* 2.7* 2.3* 2.4* 2.3*    No results for input(s): LIPASE, AMYLASE in the last 168 hours. Recent Labs  Lab 08/24/21 1718  AMMONIA 25     CBC: Recent  Labs  Lab 08/23/21 0443 08/23/21 1223 08/24/21 0430 08/25/21 0407 08/26/21 0435 08/27/21 0400  WBC 9.2  --  5.2 5.4 7.5 8.3  HGB 8.5*  --  7.9* 8.1* 9.7* 9.2*  HCT 26.6*  --  25.0* 25.7* 31.0* 29.4*  MCV 94.7  --  96.5 95.9 94.5 94.2  PLT 94* 97* 90* 114* 163 178     Cardiac Enzymes: Recent Labs  Lab 08/27/21 0400  CKTOTAL 71    BNP: Invalid input(s): POCBNP  CBG: Recent Labs  Lab 08/26/21 1409 08/26/21 1846 08/26/21 2338 08/27/21 0620 08/27/21 0631  GLUCAP 217* 203* 161* 204* 196*     Microbiology: Results for orders placed or performed during the hospital encounter of 08/16/21  Resp Panel by RT-PCR (Flu A&B, Covid) Nasopharyngeal Swab     Status: None   Collection Time: 08/16/21  8:28 PM   Specimen: Nasopharyngeal Swab; Nasopharyngeal(NP) swabs in vial transport medium  Result Value Ref Range Status   SARS Coronavirus 2 by RT PCR NEGATIVE NEGATIVE Final    Comment: (NOTE) SARS-CoV-2 target nucleic acids are NOT DETECTED.  The SARS-CoV-2 RNA is generally detectable in upper respiratory specimens during the acute phase of infection. The lowest concentration of SARS-CoV-2  viral copies this assay can detect is 138 copies/mL. A negative result does not preclude SARS-Cov-2 infection and should not be used as the sole basis for treatment or other patient management decisions. A negative result may occur with  improper specimen collection/handling, submission of specimen other than nasopharyngeal swab, presence of viral mutation(s) within the areas targeted by this assay, and inadequate number of viral copies(<138 copies/mL). A negative result must be combined with clinical observations, patient history, and epidemiological information. The expected result is Negative.  Fact Sheet for Patients:  EntrepreneurPulse.com.au  Fact Sheet for Healthcare Providers:  IncredibleEmployment.be  This test is no t yet approved or  cleared by the [place] FDA and  has been authorized for detection and/or diagnosis of SARS-CoV-2 by FDA under an Emergency Use Authorization (EUA). This EUA will remain  in effect (meaning this test can be used) for the duration of the COVID-19 declaration under Section 564(b)(1) of the Act, 21 U.S.C.section 360bbb-3(b)(1), unless the authorization is terminated  or revoked sooner.       Influenza A by PCR NEGATIVE NEGATIVE Final   Influenza B by PCR NEGATIVE NEGATIVE Final    Comment: (NOTE) The Xpert Xpress SARS-CoV-2/FLU/RSV plus assay is intended as an aid in the diagnosis of influenza from Nasopharyngeal swab specimens and should not be used as a sole basis for treatment. Nasal washings and aspirates are unacceptable for Xpert Xpress SARS-CoV-2/FLU/RSV testing.  Fact Sheet for Patients: EntrepreneurPulse.com.au  Fact Sheet for Healthcare Providers: IncredibleEmployment.be  This test is not yet approved or cleared by the [place] FDA and has been authorized for detection and/or diagnosis of SARS-CoV-2 by FDA under an Emergency Use Authorization (EUA). This EUA will remain in effect (meaning this test can be used) for the duration of the COVID-19 declaration under Section 564(b)(1) of the Act, 21 U.S.C. section 360bbb-3(b)(1), unless the authorization is terminated or revoked.  Performed at [facility], [address] [place], [place] [address]   Urine Culture     Status: None   Collection Time: 08/17/21 11:00 AM   Specimen: Urine, Clean Catch  Result Value Ref Range Status   Specimen Description   Final    URINE, CLEAN CATCH Performed at [facility], [address], [place], [place] [address]    Special Requests   Final    NONE Performed at [facility], [address] [place], [place] [address]    Culture   Final    NO GROWTH Performed at [facility], [place] [address] [place], [place] [address]    Report  Status 08/19/2021 FINAL  Final  Culture, blood (Routine X 2) w Reflex to ID Panel     Status: None   Collection Time: 08/19/21  7:28 AM   Specimen: BLOOD LEFT ARM  Result Value Ref Range Status   Specimen Description BLOOD LEFT ARM BOTTLES DRAWN AEROBIC AND ANAEROBIC  Final   Special Requests   Final    Blood Culture results may not be optimal due to an excessive volume of blood received in culture bottles   Culture   Final    NO GROWTH 5 DAYS Performed at [facility], [address], [place], [place] [address]    Report Status 08/24/2021 FINAL  Final  Culture, blood (Routine X 2) w Reflex to ID Panel     Status: None   Collection Time: 08/19/21  8:27 AM   Specimen: BLOOD LEFT HAND  Result Value Ref Range Status   Specimen  Description   Final    BLOOD LEFT HAND BOTTLES DRAWN AEROBIC AND ANAEROBIC   Special Requests Blood Culture adequate volume  Final   Culture   Final    NO GROWTH 5 DAYS Performed at [facility], [address] [place], [place] [address]    Report Status 08/24/2021 FINAL  Final  MRSA Next Gen by PCR, Nasal     Status: Abnormal   Collection Time: 08/21/21  5:40 PM   Specimen: Nasal Mucosa; Nasal Swab  Result Value Ref Range Status   MRSA by PCR Next Gen DETECTED (A) NOT DETECTED Final    Comment: RESULT CALLED TO, READ BACK BY AND VERIFIED WITH: JESSICA HEARN RN,2021,08/21/2021, SELF S. (NOTE) The GeneXpert MRSA Assay (FDA approved for NASAL specimens only), is one component of a comprehensive MRSA colonization surveillance program. It is not intended to diagnose MRSA infection nor to guide or monitor treatment for MRSA infections. Test performance is not FDA approved in patients less than 60 years old. Performed at [facility], [address] [place], [place] [address]   MRSA Next Gen by PCR, Nasal     Status: Abnormal   Collection Time: 08/26/21 12:49 AM   Specimen: Nasal Mucosa; Nasal Swab  Result Value Ref Range Status   MRSA by PCR Next Gen DETECTED  (A) NOT DETECTED Final    Comment: RESULT CALLED TO, READ BACK BY AND VERIFIED WITH: RN DAVID S.  [id number] FCP (NOTE) The GeneXpert MRSA Assay (FDA approved for NASAL specimens only), is one component of a comprehensive MRSA colonization surveillance program. It is not intended to diagnose MRSA infection nor to guide or monitor treatment for MRSA infections. Test performance is not FDA approved in patients less than 68 years old. Performed at [facility], [place] [address], [place], [place] [address]   Respiratory (~20 pathogens) panel by PCR     Status: None   Collection Time: 08/26/21  9:31 AM   Specimen: Nasopharyngeal Swab; Respiratory  Result Value Ref Range Status   Adenovirus NOT DETECTED NOT DETECTED Final   Coronavirus 229E NOT DETECTED NOT DETECTED Final    Comment: (NOTE) The Coronavirus on the Respiratory Panel, DOES NOT test for the novel  Coronavirus (2019 nCoV)    Coronavirus HKU1 NOT DETECTED NOT DETECTED Final   Coronavirus NL63 NOT DETECTED NOT DETECTED Final   Coronavirus OC43 NOT DETECTED NOT DETECTED Final   Metapneumovirus NOT DETECTED NOT DETECTED Final   Rhinovirus / Enterovirus NOT DETECTED NOT DETECTED Final   Influenza A NOT DETECTED NOT DETECTED Final   Influenza B NOT DETECTED NOT DETECTED Final   Parainfluenza Virus 1 NOT DETECTED NOT DETECTED Final   Parainfluenza Virus 2 NOT DETECTED NOT DETECTED Final   Parainfluenza Virus 3 NOT DETECTED NOT DETECTED Final   Parainfluenza Virus 4 NOT DETECTED NOT DETECTED Final   Respiratory Syncytial Virus NOT DETECTED NOT DETECTED Final   Bordetella pertussis NOT DETECTED NOT DETECTED Final   Bordetella Parapertussis NOT DETECTED NOT DETECTED Final   Chlamydophila pneumoniae NOT DETECTED NOT DETECTED Final   Mycoplasma pneumoniae NOT DETECTED NOT DETECTED Final    Comment: Performed at [facility], [place]. [address] [place], [place] [address]    Coagulation Studies: Recent Labs     08/26/21 1343  LABPROT 15.6*  INR 1.2     Urinalysis: No results for input(s): COLORURINE, LABSPEC, PHURINE, GLUCOSEU, HGBUR, BILIRUBINUR, KETONESUR, PROTEINUR, UROBILINOGEN, NITRITE, LEUKOCYTESUR in the last 72 hours.  Invalid  input(s): APPERANCEUR    Imaging: CT CHEST WO CONTRAST  Result Date: 08/25/2021 CLINICAL DATA:  Respiratory illness, shortness of breath EXAM: CT CHEST WITHOUT CONTRAST TECHNIQUE: Multidetector CT imaging of the chest was performed following the standard protocol without IV contrast. COMPARISON:  CT chest 12/22/2014 FINDINGS: Cardiovascular: Heart is enlarged. No pericardial effusion identified. Coronary artery calcifications. Main pulmonary artery is normal caliber. Thoracic aorta is normal in course and caliber with mild calcified plaques. Mediastinum/Nodes: A few mildly enlarged mediastinal lymph nodes identified measuring up to 1.2 cm in short axis precarinal. No bulky axillary lymphadenopathy or definite hilar lymphadenopathy identified, given limitations of a noncontrast study. Right-sided central line noted. Lungs/Pleura: Extensive bilateral increased interstitial and irregular consolidative and ground-glass airspace opacities throughout all lobes. Trace right pleural effusion. Minor subsegmental atelectasis at the lung bases. No pneumothorax. Upper Abdomen: Subtle nodularity of the liver suggesting cirrhosis. Spleen is enlarged measuring 13 cm in length. Musculoskeletal: No suspicious bony lesions visualized. IMPRESSION: 1. Bilateral extensive interstitial and alveolar densities which likely represent pneumonia. Trace right pleural effusion. Continued follow-up recommended. 2. Cardiomegaly. 3. Evidence of hepatic cirrhosis. Splenomegaly which could represent portal hypertension. Electronically Signed   By: Ofilia Neas M.D.   On: 08/25/2021 11:57   EEG adult  Result Date: 08/26/2021 Derek Jack, MD     08/26/2021  4:19 PM Routine EEG Report DEVONDA PEQUIGNOT is a 70 y.o. female with a history of encephalopathy who is undergoing an EEG to evaluate for seizures. Report: This EEG was acquired with electrodes placed according to the International 10-20 electrode system (including Fp1, Fp2, F3, F4, C3, C4, P3, P4, O1, O2, T3, T4, T5, T6, A1, A2, Fz, Cz, Pz). The following electrodes were missing or displaced: none. The occipital dominant rhythm was 7-8 Hz with overriding beta frequencies. This activity is reactive to stimulation. Drowsiness was manifested by background fragmentation; deeper stages of sleep were not identified. There was no focal slowing. There were no interictal epileptiform discharges. There were no electrographic seizures identified. Photic stimulation and hyperventilation were not performed. Impression and clinical correlation: This EEG was obtained while awake and drowsy and is abnormal due to mild diffuse slowing indicative of global cerebral dysfunction. No epileptiform abnormalities were observed during this recording. Su Monks, MD [facility] [phone] If 7pm- 7am, please page neurology on call as listed in [place].     Medications:    sodium chloride     sodium chloride     sodium chloride 250 mL (08/26/21 1235)   cefTRIAXone (ROCEPHIN)  IV 2 g (08/26/21 1239)   magnesium sulfate bolus IVPB     potassium chloride 10 mEq (08/27/21 0849)   thiamine injection 500 mg (08/26/21 1622)    allopurinol  100 mg Oral Daily   atorvastatin  80 mg Oral QHS   Chlorhexidine Gluconate Cloth  6 each Topical Daily   diltiazem  60 mg Oral Q8H   hydrALAZINE  50 mg Oral Q8H   insulin aspart  0-9 Units Subcutaneous TID WC   insulin detemir  15 Units Subcutaneous QHS   labetalol  200 mg Oral BID   lipase/protease/amylase  36,000-72,000 Units Oral See admin instructions   mupirocin ointment  1 application Nasal BID   pantoprazole  40 mg Oral Daily   potassium chloride  40 mEq Oral Once   sodium bicarbonate  650 mg Oral TID    sodium chloride flush  3 mL Intravenous Q12H   venlafaxine XR  75 mg Oral  Daily   sodium chloride, sodium chloride, sodium chloride, acetaminophen, diphenhydrAMINE, diphenoxylate-atropine, haloperidol lactate, hydrALAZINE, HYDROmorphone (DILAUDID) injection, HYDROmorphone, ipratropium-albuterol, LORazepam, meclizine, ondansetron **OR** ondansetron (ZOFRAN) IV, sodium chloride flush, temazepam  Assessment/ Plan:  AKI/CKD stage IIIb - in setting of PNA and hypertensive urgency and chronic diarrhea.  Baseline Scr variable 1.6-2.2 and follows Dr. Theador Hawthorne as an outpatient. Required HD 10/25 due to hyperkalemia with UF of 2 liters.  Cr now improving off HD - 3.7 to 3.4 in past 24h.  Serologies negative   high K/L light chains noted; will check SPEP and UPEP.  Noted proteinuria that is not new. UOP was being better tracked until yesterday and was increasing (2.5L 10/28-29).  Insert foley due to purewick not working Maintain temp HD catheter for now but if continued improvement in coming days will d/c Acute hypoxic respiratory failure - due to multifocal pneumonia vs CHF.  Currently on Abx per primary.   last dialysis 10/25 with 2 L removed. Negative balance noted . V/Q negative   HTN urgency/emergency -Has diltiazem, labetalol and hydralazine po, IV prn metoprolol and hydralazine.  BP remains labile in last 24h with several med changes noted -- no further changes for now but hopefully can get on a simplified regimen.   AMS  waxing and waning; neurology evaluating at [facility]. EEG neg seizure. MRI pending. Hypomagnesemia - likely due to chronic diarrhea. Being repleted. Hypokalemia: being repleted. Anemia of critical illness - s/p transfusion.  Continue to follow H/H.  Thrombocytopenia - no schistocytes on smear  Platelet count improved with discontinuation of heparin and control of BP.  ADAMST13  40.6% LDH 375 .  This could be associated with a malignant hypertension.  Platelets induced platelet antibody  negative.      LOS: [place] @TODAY @9 :38 AM

## 2021-08-27 NOTE — Progress Notes (Signed)
Unable to give afternoon medications due to Pt refusal, agitation, combativeness. MD notified.

## 2021-08-27 NOTE — Progress Notes (Signed)
Unable to start urinary catheter due to Pt refusal, agitation, combativeness. MD notified.

## 2021-08-27 NOTE — Progress Notes (Signed)
Dr. Jannifer Hick text paged, notifed MD pt is not keeping purwick, incontinent. May not be able to collect 24 hr urine without foley. MD called back and said dont worry about it. Will continue to monitor.

## 2021-08-27 NOTE — Progress Notes (Signed)
Pt wanted to urinate, placed in bed pan. Pt is confused, pulling on lines, trying to get oob. After urinating , she pulled the bed pan off. Most of urine spilled in the bed, unable to collect sample for 24 hr. Will continue to monitor.

## 2021-08-27 NOTE — Progress Notes (Signed)
PROGRESS NOTE    Heather [name]  AYT:[id number] DOB: Nov 06, 1950 DOA: 08/16/2021 PCP: [name], MD    Brief Narrative:  This 70 year old female with history of CKD stage IIIb, Crohn's disease and IBS with chronic diarrhea, hypertension, type 2 diabetes, BPPV who presented to the emergency room with blurry vision and diplopia for 2 days associated  with stabbing headache mostly on the back of her head.  She had urinary symptoms and was treated with doxycycline for last 7 days.  She also developed diffuse rash while taking it.  In the emergency room afebrile.  Heart rate normal.  Blood pressure systolic 355-732.  Patient was given labetalol in the ER and blood pressure improved.  Neurology was consulted.  MRI of the brain and MRA/MRV was done and was essentially normal.  She was also found to be suffering with acute kidney injury on CKDIIIb.  She has required hemodialysis initiation on 10/23 with IJ catheter placement.  2 units PRBCs transfused on 10/23 as well for acute anemia.  She continues to have some ongoing confusion.  Nephrology continues to follow to determine need for long-term hemodialysis.  Assessment & Plan:   Principal Problem:   Blurry vision, bilateral Active Problems:   Essential hypertension   Type 2 diabetes with nephropathy (HCC)   Depression   Acute kidney injury superimposed on CKD (HCC)   Crohn's disease of small intestine with other complication (HCC)   Fusion of spine, cervicothoracic region   Hypomagnesemia   Acute hypokalemia   Acute respiratory failure with hypoxia (HCC)   Acute metabolic encephalopathy   Malignant hypertension   Thrombocytopenia (HCC)   Anemia due to chronic kidney disease   Pressure injury of skin  Acute hypoxic respiratory failure > multifactorial> Resolved. This could be due to multifocal pneumonia versus acute diastolic CHF exacerbation. V/Q scan did not show any evidence of PE. She was initially on room air on admission but  her oxygen requirement has gone up requiring 15 L of high flow oxygen but now weaned down to Room air.  Multifocal pneumonia: CT chest showed bilateral pulmonary infiltrate . Despite aggressive diuresis, infiltrates have persisted. Pulmonology consulted, since patient was taking Humira prior to admission, there is concern for atypical infection. MRSA PCR noted positive . Patient was started on cefepime and linezolid. Neurology recommended to change cefepime if possible. Cefepime changed to ceftriaxone. Continue ceftriaxone and vancomycin. Pulm :No indication for bronchoscopy at this time.   Acute diastolic congestive heart failure: Volume status was being managed with hemodialysis as well as Lasix infusion. Her weight is down approximately 20 pounds since admission. Nephrology is following,  recommended to discontinue Lasix infusion.  Continue Lasix as needed.   Acute urinary retention: Continue Foley catheter.  Consider voiding trial.   Acute Encephalopathy:  Etiology is not entirely clear.  Uncontrolled severe hypertension could be contributing. Initial MRI unremarkable, mental status does appear to be intact at the time. Her husband does describe some periods of confusion at home, but these appear to be transient.   Review of notes from earlier in this hospitalization showed that she was awake, alert and conversant at that time.   Clearly her mental status had a progressive decline through this hospitalization. Currently she remains quite agitated, confused, trying to get out of bed and refusing medications at times. TSH, ammonia, ABG were noncontributory. ESR markedly elevated at 127. Connective tissue disease work-up has thus far been unremarkable. She might be having any encephalitis Neurology consulted.  Recommended  MRI brain, EEG, change cefepime if possible Thiamine 500 mg IV daily, consider lumbar puncture.   AKI on CKD stage IIIb: Patient had temporary dialysis  catheter placed in right IJ. She underwent hemodialysis on 10/23 and 10/25. Renal ultrasound with No significant findings. Lasix infusion discontinued and continue lasix PRN. Further management as per nephrology   Hypertensive urgency:  > Improving Patient presented with headache and ocular symptoms associated with uncontrolled hypertension on admission. Subsequently, blood pressures were noted to be low which felt was contributing to her worsening renal failure. Patient had difficult to control BP readings for the last several days. Continue diltiazem, labetalol and hydralazine po and  as needed hydralazine  Likely need further titration of her antihypertensive medications.   Acute on chronic anemia > stable. Could be related to CKD. S/p 2 unit PRBC transfusion on 10/23. Monitor for overt bleeding. Hb remains stable.   Thrombocytopenia:  > Improved. HIT panel negative. No schistocytes noted on smear making TTP unlikely DIC panel checked, elevated fibrinogen which would make DIC unlikely LDH is elevated indicating some degree of hemolysis.  Acute diplopia and headaches: > Improved. Unclear etiology and assessed by neurology Likely was related to severe hypomagnesemia which had been corrected It was felt that her occipital headache may also be referred from her chronic cervical spine disease. Difficult to assess continued symptoms at this point, since patient is confused and unable to provide reliable history   Non-anion gap metabolic acidosis It could be related to diarrhea and possibly AKI. Overall improved.   Type 2 diabetes:  Continue SSI and Levemir  Crohn's disease with chronic diarrhea Resume Humira outpatient. No diarrhea at present    DVT prophylaxis: Heparin Code Status: DNR Family Communication: No family at bed side. Disposition Plan:   Status is: Inpatient  Remains inpatient appropriate because:  Continued encephalopathy, uncontrolled blood pressure and  persistent pneumonia requiring further work-up  Anticipated discharge to SNF or home health services in few days   Consultants:  Nephrology Neurology Pulmonology PCCM  Procedures: Right IJ hemodialysis catheter 10/23 Antimicrobials:   Anti-infectives (From admission, onward)    Start     Dose/Rate Route Frequency Ordered Stop   08/26/21 1515  vancomycin (VANCOREADY) IVPB 1250 mg/250 mL        1,250 mg 166.7 mL/hr over 90 Minutes Intravenous  Once 08/26/21 1427 08/26/21 1913   08/26/21 1200  ceFEPIme (MAXIPIME) 2 g in sodium chloride 0.9 % 100 mL IVPB  Status:  Discontinued        2 g 200 mL/hr over 30 Minutes Intravenous Every 24 hours 08/25/21 1710 08/26/21 0811   08/26/21 0900  cefTRIAXone (ROCEPHIN) 2 g in sodium chloride 0.9 % 100 mL IVPB        2 g 200 mL/hr over 30 Minutes Intravenous Every 24 hours 08/26/21 0811     08/23/21 1000  linezolid (ZYVOX) tablet 600 mg  Status:  Discontinued        600 mg Oral Every 12 hours 08/22/21 1038 08/26/21 0959   08/23/21 0900  ceFEPIme (MAXIPIME) 2 g in sodium chloride 0.9 % 100 mL IVPB        2 g 200 mL/hr over 30 Minutes Intravenous Every 24 hours 08/23/21 0736 08/25/21 1900   08/19/21 0745  ceFEPIme (MAXIPIME) 2 g in sodium chloride 0.9 % 100 mL IVPB  Status:  Discontinued        2 g 200 mL/hr over 30 Minutes Intravenous Every 24 hours 08/19/21 0651 08/23/21  7915   08/19/21 0745  vancomycin (VANCOREADY) IVPB 750 mg/150 mL  Status:  Discontinued        750 mg 150 mL/hr over 60 Minutes Intravenous Every 48 hours 08/19/21 0651 08/22/21 1038   08/18/21 0945  fluconazole (DIFLUCAN) tablet 150 mg        150 mg Oral  Once 08/18/21 0569 08/18/21 0951        Subjective: Patient was seen and examined at bedside.  Overnight events noted.  Patient is alert and awake. Her blood pressure is finally improved.. She is following partial commands.  Objective: Vitals:   08/27/21 0037 08/27/21 0520 08/27/21 0832 08/27/21 1135  BP: (!) 162/50  (!) 139/58 (!) 147/93 (!) 147/73  Pulse: 89 82 80 78  Resp: _0 Temp: 98 F (36.7 C) 97.6 F (36.4 C) (!) 97.5 F (36.4 C) (!) 97.5 F (36.4 C)  TempSrc: Oral Oral Axillary Oral  SpO2: 97% 98% 100% 99%  Weight:  67.3 kg    Height:        Intake/Output Summary (Last 24 hours) at 08/27/2021 1349 Last data filed at 08/27/2021 0300 Gross per 24 hour  Intake 700.33 ml  Output --  Net 700.33 ml   Filed Weights   08/26/21 0050 08/26/21 0500 08/27/21 0520  Weight: 64.2 kg 63.9 kg 67.3 kg    Examination:  General exam: Appears comfortable, confused, following partial commands.Chronically ill looking. Respiratory system: Clear to auscultation. Respiratory effort normal. Cardiovascular system: S1-S2 heard, regular rate and rhythm, no murmur.   Gastrointestinal system: Abdomen is soft, nontender, nondistended, BS + Central nervous system: Alert and oriented x 1, confused, following partial commands. No focal neurological deficits. Extremities: No edema, no cyanosis, no clubbing. Skin: No rashes, lesions or ulcers Psychiatry: Judgement and insight appear normal. Mood & affect appropriate.     Data Reviewed: I have personally reviewed following labs and imaging studies  CBC: Recent Labs  Lab 08/23/21 0443 08/23/21 1223 08/24/21 0430 08/25/21 0407 08/26/21 0435 08/27/21 0400  WBC 9.2  --  5.2 5.4 7.5 8.3  HGB 8.5*  --  7.9* 8.1* 9.7* 9.2*  HCT 26.6*  --  25.0* 25.7* 31.0* 29.4*  MCV 94.7  --  96.5 95.9 94.5 94.2  PLT 94* 97* 90* 114* 163 794   Basic Metabolic Panel: Recent Labs  Lab 08/21/21 0445 08/22/21 0504 08/23/21 0443 08/24/21 0430 08/25/21 0407 08/26/21 0435 08/27/21 0400  NA 135 136 140 141 143  --  144  K 4.0 3.7 3.7 3.6 3.8  --  2.8*  CL 100 101 102 103 104  --  101  CO2 21* _1 --  29  GLUCOSE 126* 149* 130* 96 146*  --  220*  BUN 47* 57* 40* 55* 72*  --  76*  CREATININE 3.29* 3.71* 2.81* 3.12* 3.65*  --  3.37*  CALCIUM 8.3* 8.8*  8.3* 8.7* 8.8*  --  9.3  MG 1.9 2.0 2.0  --   --  2.0 1.6*  PHOS  --  5.3* 3.5 3.8 4.8*  --  3.4  3.4   GFR: Estimated Creatinine Clearance: 14.8 mL/min (A) (by C-G formula based on SCr of 3.37 mg/dL (H)). Liver Function Tests: Recent Labs  Lab 08/22/21 0504 08/23/21 0443 08/24/21 0430 08/25/21 0407 08/27/21 0400  ALBUMIN 2.4* 2.7* 2.3* 2.4* 2.3*   No results for input(s): LIPASE, AMYLASE in the last 168 hours. Recent Labs  Lab 08/24/21 1718  AMMONIA 25   Coagulation Profile: Recent Labs  Lab 08/23/21 1223 08/26/21 1343  INR 1.3* 1.2   Cardiac Enzymes: Recent Labs  Lab 08/27/21 0400  CKTOTAL 71   BNP (last 3 results) No results for input(s): PROBNP in the last 8760 hours. HbA1C: No results for input(s): HGBA1C in the last 72 hours. CBG: Recent Labs  Lab 08/26/21 1846 08/26/21 2338 08/27/21 0620 08/27/21 0631 08/27/21 1134  GLUCAP 203* 161* 204* 196* 136*   Lipid Profile: No results for input(s): CHOL, HDL, LDLCALC, TRIG, CHOLHDL, LDLDIRECT in the last 72 hours. Thyroid Function Tests: No results for input(s): TSH, T4TOTAL, FREET4, T3FREE, THYROIDAB in the last 72 hours. Anemia Panel: No results for input(s): VITAMINB12, FOLATE, FERRITIN, TIBC, IRON, RETICCTPCT in the last 72 hours. Sepsis Labs: No results for input(s): PROCALCITON, LATICACIDVEN in the last 168 hours.  Recent Results (from the past 240 hour(s))  Culture, blood (Routine X 2) w Reflex to ID Panel     Status: None   Collection Time: 08/19/21  7:28 AM   Specimen: BLOOD LEFT ARM  Result Value Ref Range Status   Specimen Description BLOOD LEFT ARM BOTTLES DRAWN AEROBIC AND ANAEROBIC  Final   Special Requests   Final    Blood Culture results may not be optimal due to an excessive volume of blood received in culture bottles   Culture   Final    NO GROWTH 5 DAYS Performed at [facility], [address] [place], [place] [address]    Report Status 08/24/2021 FINAL  Final  Culture, blood  (Routine X 2) w Reflex to ID Panel     Status: None   Collection Time: 08/19/21  8:27 AM   Specimen: BLOOD LEFT HAND  Result Value Ref Range Status   Specimen Description   Final    BLOOD LEFT HAND BOTTLES DRAWN AEROBIC AND ANAEROBIC   Special Requests Blood Culture adequate volume  Final   Culture   Final    NO GROWTH 5 DAYS Performed at [facility], [address] [place], [place] [address]    Report Status 08/24/2021 FINAL  Final  MRSA Next Gen by PCR, Nasal     Status: Abnormal   Collection Time: 08/21/21  5:40 PM   Specimen: Nasal Mucosa; Nasal Swab  Result Value Ref Range Status   MRSA by PCR Next Gen DETECTED (A) NOT DETECTED Final    Comment: RESULT CALLED TO, READ BACK BY AND VERIFIED WITH: JESSICA HEARN RN,2021,08/21/2021, SELF S. (NOTE) The GeneXpert MRSA Assay (FDA approved for NASAL specimens only), is one component of a comprehensive MRSA colonization surveillance program. It is not intended to diagnose MRSA infection nor to guide or monitor treatment for MRSA infections. Test performance is not FDA approved in patients less than 34 years old. Performed at [facility], [address], [place], [place] [address]   MRSA Next Gen by PCR, Nasal     Status: Abnormal   Collection Time: 08/26/21 12:49 AM   Specimen: Nasal Mucosa; Nasal Swab  Result Value Ref Range Status   MRSA by PCR Next Gen DETECTED (A) NOT DETECTED Final    Comment: RESULT CALLED TO, READ BACK BY AND VERIFIED WITH: RN DAVID S.  [id number] FCP (NOTE) The GeneXpert MRSA Assay (FDA approved for NASAL specimens only), is one component of a comprehensive MRSA colonization surveillance program. It is not intended to diagnose MRSA infection nor to guide or monitor treatment for MRSA infections. Test performance is not FDA  approved in patients less than 59 years old. Performed at [facility], [place] [address], [place], [place] [address]   Respiratory (~20 pathogens) panel by PCR     Status: None    Collection Time: 08/26/21  9:31 AM   Specimen: Nasopharyngeal Swab; Respiratory  Result Value Ref Range Status   Adenovirus NOT DETECTED NOT DETECTED Final   Coronavirus 229E NOT DETECTED NOT DETECTED Final    Comment: (NOTE) The Coronavirus on the Respiratory Panel, DOES NOT test for the novel  Coronavirus (2019 nCoV)    Coronavirus HKU1 NOT DETECTED NOT DETECTED Final   Coronavirus NL63 NOT DETECTED NOT DETECTED Final   Coronavirus OC43 NOT DETECTED NOT DETECTED Final   Metapneumovirus NOT DETECTED NOT DETECTED Final   Rhinovirus / Enterovirus NOT DETECTED NOT DETECTED Final   Influenza A NOT DETECTED NOT DETECTED Final   Influenza B NOT DETECTED NOT DETECTED Final   Parainfluenza Virus 1 NOT DETECTED NOT DETECTED Final   Parainfluenza Virus 2 NOT DETECTED NOT DETECTED Final   Parainfluenza Virus 3 NOT DETECTED NOT DETECTED Final   Parainfluenza Virus 4 NOT DETECTED NOT DETECTED Final   Respiratory Syncytial Virus NOT DETECTED NOT DETECTED Final   Bordetella pertussis NOT DETECTED NOT DETECTED Final   Bordetella Parapertussis NOT DETECTED NOT DETECTED Final   Chlamydophila pneumoniae NOT DETECTED NOT DETECTED Final   Mycoplasma pneumoniae NOT DETECTED NOT DETECTED Final    Comment: Performed at [facility], [place]. [address], [place], [place] [address]         Radiology Studies: MR BRAIN WO CONTRAST  Result Date: 08/27/2021 CLINICAL DATA:  Delirium.  Patient motion despite Ativan sedation EXAM: MRI HEAD WITHOUT CONTRAST TECHNIQUE: Multiplanar, multiecho pulse sequences of the brain and surrounding structures were obtained without intravenous contrast. COMPARISON:  08/16/2021 FINDINGS: Brain: Small acute or early subacute infarcts in the left cerebellum and left frontal operculum since prior. No acute hemorrhage, hydrocephalus, or mass/finding. Remote bilateral globus pallidus insult. Slightly hyperintense left posterior putamen but no restricted diffusion or swelling,  likely stable in from remote insult. Vascular: Major flow voids are preserved. Mild chronic small vessel ischemia in the hemispheric white matter Skull and upper cervical spine: Normal marrow signal.  C3-4 ACDF Sinuses/Orbits: Negative IMPRESSION: Very motion degraded study but still positive for small acute infarcts in the left cerebellum and left frontal operculum since study 11 days ago. Electronically Signed   By: Jorje Guild M.D.   On: 08/27/2021 11:20   DG Chest Port 1 View  Result Date: 08/27/2021 CLINICAL DATA:  Follow-up pulmonary airspace process. EXAM: PORTABLE CHEST 1 VIEW COMPARISON:  Chest x-ray 08/21/2021 and chest CT 08/17/2021 FINDINGS: The right IJ central venous catheter is stable. The cardiac silhouette, mediastinal and hilar contours are within normal limits unchanged. Much improved lung aeration with resolving interstitial and airspace process, likely resolving pulmonary edema. No pleural effusions. No pneumothorax. IMPRESSION: Much improved lung aeration, likely resolving pulmonary edema. Electronically Signed   By: Marijo Sanes M.D.   On: 08/27/2021 12:14   EEG adult  Result Date: 08/26/2021 Derek Jack, MD     08/26/2021  4:19 PM Routine EEG Report SHANA ZAVALETA is a 70 y.o. female with a history of encephalopathy who is undergoing an EEG to evaluate for seizures. Report: This EEG was acquired with electrodes placed according to the International 10-20 electrode system (including Fp1, Fp2, F3, F4, C3, C4, P3, P4, O1, O2, T3, T4, T5, T6, A1, A2,  Fz, Cz, Pz). The following electrodes were missing or displaced: none. The occipital dominant rhythm was 7-8 Hz with overriding beta frequencies. This activity is reactive to stimulation. Drowsiness was manifested by background fragmentation; deeper stages of sleep were not identified. There was no focal slowing. There were no interictal epileptiform discharges. There were no electrographic seizures identified. Photic  stimulation and hyperventilation were not performed. Impression and clinical correlation: This EEG was obtained while awake and drowsy and is abnormal due to mild diffuse slowing indicative of global cerebral dysfunction. No epileptiform abnormalities were observed during this recording. Su Monks, MD [facility] [phone] If 7pm- 7am, please page neurology on call as listed in [place].    Scheduled Meds:  allopurinol  100 mg Oral Daily   atorvastatin  80 mg Oral QHS   Chlorhexidine Gluconate Cloth  6 each Topical Daily   diltiazem  60 mg Oral Q8H   hydrALAZINE  50 mg Oral Q8H   insulin aspart  0-9 Units Subcutaneous TID WC   insulin detemir  15 Units Subcutaneous QHS   labetalol  200 mg Oral BID   lipase/protease/amylase  36,000-72,000 Units Oral See admin instructions   mupirocin ointment  1 application Nasal BID   pantoprazole  40 mg Oral Daily   potassium chloride  40 mEq Oral Once   sodium bicarbonate  650 mg Oral TID   sodium chloride flush  3 mL Intravenous Q12H   venlafaxine XR  75 mg Oral Daily   Continuous Infusions:  sodium chloride     sodium chloride     sodium chloride 250 mL (08/26/21 1235)   cefTRIAXone (ROCEPHIN)  IV 2 g (08/26/21 1239)   magnesium sulfate bolus IVPB     thiamine injection 500 mg (08/26/21 1622)     LOS: 10 days    Time spent: 35 mins    Heather Gholson, MD [facility]   If 7PM-7AM, please contact night-coverage

## 2021-08-28 ENCOUNTER — Inpatient Hospital Stay (HOSPITAL_COMMUNITY): Payer: 59

## 2021-08-28 DIAGNOSIS — I639 Cerebral infarction, unspecified: Secondary | ICD-10-CM

## 2021-08-28 DIAGNOSIS — I6330 Cerebral infarction due to thrombosis of unspecified cerebral artery: Secondary | ICD-10-CM | POA: Insufficient documentation

## 2021-08-28 DIAGNOSIS — I6340 Cerebral infarction due to embolism of unspecified cerebral artery: Secondary | ICD-10-CM

## 2021-08-28 LAB — METHYLMALONIC ACID, SERUM: Methylmalonic Acid, Quantitative: 389 nmol/L — ABNORMAL HIGH (ref 0–378)

## 2021-08-28 LAB — GLUCOSE, CAPILLARY
Glucose-Capillary: 83 mg/dL (ref 70–99)
Glucose-Capillary: 148 mg/dL — ABNORMAL HIGH (ref 70–99)
Glucose-Capillary: 425 mg/dL — ABNORMAL HIGH (ref 70–99)

## 2021-08-28 LAB — MAGNESIUM: Magnesium: 2.4 mg/dL (ref 1.7–2.4)

## 2021-08-28 LAB — IMMUNOFIXATION ELECTROPHORESIS
IgA: 364 mg/dL — ABNORMAL HIGH (ref 87–352)
IgG (Immunoglobin G), Serum: 719 mg/dL (ref 586–1602)
IgM (Immunoglobulin M), Srm: 40 mg/dL (ref 26–217)
Total Protein ELP: 5.5 g/dL — ABNORMAL LOW (ref 6.0–8.5)

## 2021-08-28 LAB — RENAL FUNCTION PANEL
Albumin: 2.3 g/dL — ABNORMAL LOW (ref 3.5–5.0)
Anion gap: 13 (ref 5–15)
BUN: 79 mg/dL — ABNORMAL HIGH (ref 8–23)
CO2: 28 mmol/L (ref 22–32)
Calcium: 9.1 mg/dL (ref 8.9–10.3)
Chloride: 100 mmol/L (ref 98–111)
Creatinine, Ser: 3.88 mg/dL — ABNORMAL HIGH (ref 0.44–1.00)
GFR, Estimated: 12 mL/min — ABNORMAL LOW (ref >60)
Glucose, Bld: 151 mg/dL — ABNORMAL HIGH (ref 70–99)
Phosphorus: 4.1 mg/dL (ref 2.5–4.6)
Potassium: 3 mmol/L — ABNORMAL LOW (ref 3.5–5.1)
Sodium: 141 mmol/L (ref 135–145)

## 2021-08-28 LAB — CBC
HCT: 29.3 % — ABNORMAL LOW (ref 36.0–46.0)
Hemoglobin: 9.3 g/dL — ABNORMAL LOW (ref 12.0–15.0)
MCH: 29.8 pg (ref 26.0–34.0)
MCHC: 31.7 g/dL (ref 30.0–36.0)
MCV: 93.9 fL (ref 80.0–100.0)
Platelets: 206 10*3/uL (ref 150–400)
RBC: 3.12 MIL/uL — ABNORMAL LOW (ref 3.87–5.11)
RDW: 15.6 % — ABNORMAL HIGH (ref 11.5–15.5)
WBC: 9.2 10*3/uL (ref 4.0–10.5)
nRBC: 0 % (ref 0.0–0.2)

## 2021-08-28 LAB — VANCOMYCIN, RANDOM
Vancomycin Rm: 23
Vancomycin Rm: 22

## 2021-08-28 MED ORDER — LACTATED RINGERS IV SOLN: INTRAVENOUS | Status: AC

## 2021-08-28 MED ORDER — ASPIRIN EC 81 MG PO TBEC
81.0000 mg | DELAYED_RELEASE_TABLET | Freq: Every day | ORAL | Status: DC
  Administered 2021-08-28 – 2021-09-06 (×10): 81 mg via ORAL
  Filled 2021-08-28 (×11): qty 1

## 2021-08-28 MED ORDER — POTASSIUM CHLORIDE CRYS ER 20 MEQ PO TBCR
40.0000 meq | EXTENDED_RELEASE_TABLET | Freq: Once | ORAL | Status: AC
  Administered 2021-08-28: 40 meq via ORAL
  Filled 2021-08-28: qty 2

## 2021-08-28 MED ORDER — VANCOMYCIN VARIABLE DOSE PER UNSTABLE RENAL FUNCTION (PHARMACIST DOSING): Status: DC

## 2021-08-28 NOTE — Care Management Important Message (Signed)
Important Message  Patient Details  Name: XIAO GRAUL MRN: [id number] Date of Birth: Jan 28, 1951   Medicare Important Message Given:  Yes     Shelda Altes 08/28/2021, 11:23 AM

## 2021-08-28 NOTE — Progress Notes (Addendum)
STROKE TEAM PROGRESS NOTE   ATTENDING NOTE: I reviewed above note and agree with the assessment and plan. Pt was seen and examined.   70 year old female with a history of B12 deficiency, right frontal small meningioma, diabetes, CKD, Crohn's disease on Humira admitted in [facility] on 10/19 for headache, blurry vision and hypertension.  Found to have altered mental status with AKI on CKD, needed dialysis on 10/23.  Nephrology on board, following creatinine and urine output.  Also found to have labile BP and was put on midodrine, now off on labetalol, hydralazine and Cardizem for hypertension.  Initial magnesium 0.7, received supplement now 1.6.  ESR was 127 on 08/25/2021.  CRP 1.2, blood culture negative.  Ammonia level 25, ANA negative.  Also concerning for pneumonia versus CHF, put on cefepime initially but concerning for cefepime toxicity, it was discontinued then, now still on Rocephin.  She continues to have altered mental status, and she was transferred to [facility] for further management.  EEG mild diffuse slowing, MRI on 10/19 no acute stroke, MRA left ACA stenosis, MRV negative.  However on 10/30 MRI showed left cerebellum and left frontal small/punctate infarcts.  Neurology was called back for the new finding.  EF 60 to 65%.  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  LE venous Doppler negative for DVT.  LDL 40, A1c 6.1.   Currently on my examination, patient mental status much improved, awake alert, talking coherently, orientated to place, age, months, however not to year.  Cannot remember what happened leading her for admission but told me that likely due to her headache.  No aphasia, fluent language, no dysarthria, able to name and repeat without difficulty, follows simple commands.  No gaze palsy, visual fields are full, facial symmetrical, tongue midline.  Bilateral upper extremity equal strengths, sensation intact, finger-to-nose grossly intact  bilaterally.  Gait not tested.  Patient seems to have encephalopathy on admission and now much improved.  Her encephalopathy could be multifactorial, likely due to AKI on CKD, labile BP, pneumonia with respiratory failure, cefepime toxicity.  Current mental status near baseline.  No further work-up needed at this time.  Regarding her strokes on MRI, there were punctate/small on the left cerebellum and left frontal cortex.  Etiology for stroke not quite clear, however could be due to labile BP at earlier admission.  Although embolic source cannot be completely ruled out.  Recommend aspirin 81 daily for stroke prevention.  Continue Lipitor 80.  Recommend 30-day cardiac event monitor as outpatient to rule out A. fib.  Stroke risk factor modification.  Follow-up with stroke clinic at [facility] in 4 weeks.  For detailed assessment and plan, please refer to below as I have made changes wherever appropriate.   Neurology will sign off. Please call with questions. Pt will follow up with stroke clinic NP at [facility] in about 4 weeks. Thanks for the consult.  Rosalin Hawking, MD PhD Stroke Neurology 08/28/2021 6:15 PM  I spent  35 minutes in total face-to-face time with the patient, more than 50% of which was spent in counseling and coordination of care, reviewing test results, images and medication, and discussing the diagnosis, treatment plan and potential prognosis. This patient's care requiresreview of multiple databases, neurological assessment, discussion with family, other specialists and medical decision making of high complexity.       INTERVAL HISTORY No family at bedside.patient in bed in bilateral mittens for combative/altered behavior. NAD. Patient initially admitted and seen by Neuro  hospitalist for AMS. MRI found incidental left cerebellar stroke, not thought to be related to MS changes. LP may be needed, but patient already receiving antibiotics. Electrolytes improving.  Vitals:   08/28/21 0106  08/28/21 0123 08/28/21 0604 08/28/21 0941  BP: (!) 117/50 131/69 (!) 142/44 (!) 149/59  Pulse: 70 78 80 83  Resp:  '15 20 20  ' Temp: 98.2 F (36.8 C) 98.5 F (36.9 C) 97.8 F (36.6 C) 97.8 F (36.6 C)  TempSrc: Oral Oral Oral Oral  SpO2: 96% 96% 94% 97%  Weight:   65.6 kg   Height:       CBC:  Recent Labs  Lab 08/27/21 0400 08/28/21 0633  WBC 8.3 9.2  HGB 9.2* 9.3*  HCT 29.4* 29.3*  MCV 94.2 93.9  PLT 178 761   Basic Metabolic Panel:  Recent Labs  Lab 08/27/21 0400 08/28/21 0633  NA 144 141  K 2.8* 3.0*  CL 101 100  CO2 29 28  GLUCOSE 220* 151*  BUN 76* 79*  CREATININE 3.37* 3.88*  CALCIUM 9.3 9.1  MG 1.6* 2.4  PHOS 3.4  3.4 4.1   Lipid Panel:  Recent Labs  Lab 08/27/21 1557  CHOL 107  TRIG 175*  HDL 32*  CHOLHDL 3.3  VLDL 35  LDLCALC 40   HgbA1c:  Recent Labs  Lab 08/27/21 1558  HGBA1C 6.1*    IMAGING past 24 hours No results found.  PHYSICAL EXAM Physical Exam  Constitutional: Appears well-developed and well-nourished.  Psych: Affect appropriate to situation Eyes: Normal external eye and conjunctiva. HENT: Normocephalic, no lesions, without obvious abnormality.   Musculoskeletal-no joint tenderness, deformity or swelling Cardiovascular: Normal rate and regular rhythm.  Respiratory: Effort normal, non-labored breathing saturations WNL GI: Soft.  No distension. There is no tenderness.  Skin: WDI   Neuro:  Mental Status: Alert, oriented name/age/month./year. thought content appropriate. Though she will go off subject.  Speech fluent without evidence of aphasia.  Able to follow simple commands without difficulty. Cranial Nerves: II:  Visual fields grossly normal,  III,IV, VI: ptosis not present, extra-ocular motions intact bilaterally pupils equal, round, reactive to light and accommodation V,VII: smile symmetric, facial light touch sensation normal bilaterally VIII: hearing normal bilaterally IX,X: uvula rises symmetrically XI:  bilateral shoulder shrug XII: midline tongue extension Motor: Able to raise all 4 spontaneously and to command anti gravity with good strength. Tone and bulk:normal tone throughout; no atrophy noted Sensory: light touch intact throughout, bilaterally Cerebellar: Asterixis noted. Gait: deferred     ASSESSMENT/PLAN Brief HPI per neurology consult on 08/26/21:  "Heather [name] is a 70 y.o. female with a history of B12 deficiency, small right frontal meningioma, diabetes, CKD who presented on 10/19 with headache and blurred vision.  At the time she had a severely elevated blood pressure in the 190s, CKD and was found to have pneumonia versus CHF.  Throughout this whole hospitalization, she has had markedly labile blood pressures vacillating from the 230s to the low 100s, frequently higher than 200, with the lowest being 61/45 on 10/22, after which she was started on midodrine.  She has also been found to be severely hypomagnesemic.  ESR was markedly elevated.   Her mental status has declined over the course of the hospitalization and she was seen by Dr. Merlene Laughter who initially felt that she had a multifactorial encephalopathy secondary to her multiple medical issues, but noted that she had significantly worsened when he saw her yesterday.  He began weaning Effexor to try  and reduce psychoactive medications."  Stroke:  left small/punctate cerebellum and left frontal operculum infarcts, could be due to labile BP at earlier admission, although cardioembolic source cannot be completely ruled out at this time. MRI   left small/punctate cerebellum and left frontal operculum infarcts MRA left ACA hypoplastic Carotid Doppler  left ICA 1-39% stenosis 2D Echo 10/24 EF 60-65% Recommend 30-day cardiac event monitoring as outpatient to rule out A. fib LDL 40 HgbA1c 6.1 VTE prophylaxis - SCD's No antithrombotic prior to admission, now on aspirin 81 mg daily.  Continue on discharge. Therapy  recommendations:  pending Disposition:  pending  Hypertension Home meds:  lopressor Stable Permissive hypertension (OK if < 220/120) but gradually normalize in 5-7 days Long-term BP goal normotensive  Hyperlipidemia Home meds:  lipitor 48m ,  LDL 40, goal < 70 Continue statin at discharge  Diabetes type II Controlled Home meds:  levemir, humalog HgbA1c 6.1, goal < 7.0 CBGs SSI  Other Stroke Risk Factors Advanced Age >/= 638 Other Active Problems AMS: appears to be clearing up  Acute on chronic kidney disease: HD last 10/25 catheter remains in place. BUN: 79 creatinine: 3.88 GFR: 12  Hospital day # 1[place] MSN, NP-C [facility] [phone]  To contact Stroke Continuity provider, please refer to [email or web address] After hours, contact General Neurology

## 2021-08-28 NOTE — Progress Notes (Signed)
Lower extremity venous & carotid duplex has been completed.   Preliminary results in CV Proc.   Heather [name] 08/28/2021 11:05 AM

## 2021-08-28 NOTE — Progress Notes (Signed)
Pharmacy Antibiotic Note  EDRIE EHRICH is a 70 y.o. female admitted on 08/16/2021 with pneumonia.  Pharmacy has been consulted for Vancomycin dosing.   Vancomycin random this morning came back at 23, 12 hours later came back at 22. Scr increasing from 3.37 to 3.88 (CrCl 11.8 mL/min).   Plan: Continue Ceftriaxone 2g IV q24h No vancomycin dosing tonight - will monitor Scr trend in AM to determine next vancomycin random timing F/u duration of treatment, renal function, and plans to start dialysis  Height: 5\' 4"  (162.6 cm) Weight: 65.6 kg (144 lb 10 oz) IBW/kg (Calculated) : 54.7  Temp (24hrs), Avg:98 F (36.7 C), Min:97.5 F (36.4 C), Max:98.5 F (36.9 C)  Recent Labs  Lab 08/24/21 0430 08/25/21 0407 08/26/21 0103 08/26/21 0435 08/27/21 0400 08/28/21 0633 08/28/21 1835  WBC 5.2 5.4 7.5 7.5 8.3 9.2  --   CREATININE 3.12* 3.65* 3.54*  --  3.37* 3.88*  --   VANCORANDOM  --   --   --   --   --  23 22     Estimated Creatinine Clearance: 11.8 mL/min (A) (by C-G formula based on SCr of 3.88 mg/dL (H)).    Allergies  Allergen Reactions   Duloxetine Hcl Swelling   Fluocinolone Swelling   Acetaminophen Itching and Swelling    Pt states "some swelling"   Amlodipine Besylate Swelling    Swelling of feet, fluid retention   Ciprofloxacin Nausea And Vomiting and Swelling    Swelling of face, jaw, and lips   Hydrocodone-Acetaminophen Itching   Metformin Other (See Comments)    REACTION: GI UPSET   Nsaids Other (See Comments)    "HAS BLEEDING ULCERS"   Quinolones Swelling   Sulfamethoxazole Swelling    Swelling of feet, legs   Valsartan Swelling   Cozaar [Losartan] Swelling and Rash   Lisinopril Swelling and Rash    Oral swelling, and red streaks on arms/stomach   Tape Itching and Rash    Paper tape can only be used on this patient   Antimicrobials: Vanco 10/22 >> 10/25, 10/29>> Cefepime 10/22 >>10/28 Linezolid 10/25>> x 3 days  Cultures: 10/22 Bcx: ngtd 10/20  Ucx: ng 10/24 MRSA PCR: positive 10/29 20 pathogen respiratory panel negative  Thank you for allowing pharmacy to participate in this patient's care.  Antonietta Jewel, PharmD, [place] Clinical Pharmacist  Phone: [phone] 08/28/2021 10:04 PM  Please check AMION for all [address] phone numbers After 10:00 PM, call [place] [phone]

## 2021-08-28 NOTE — Progress Notes (Signed)
[facility] ROUNDING NOTE   Subjective:   Lying in bed with mittens on.  Does not respond to questioning.  Objective:  Vital signs in last 24 hours:  Temp:  [97.8 F (36.6 C)-98.5 F (36.9 C)] 97.8 F (36.6 C) (10/31 0941) Pulse Rate:  [70-92] 83 (10/31 0941) Resp:  [14-20] 20 (10/31 0941) BP: (117-174)/(44-69) 149/59 (10/31 0941) SpO2:  [94 %-98 %] 97 % (10/31 0941) Weight:  [65.6 kg] 65.6 kg (10/31 0604)  Weight change: -1.7 kg Filed Weights   08/26/21 0500 08/27/21 0520 08/28/21 0604  Weight: 63.9 kg 67.3 kg 65.6 kg    Intake/Output: I/O last 3 completed shifts: In: 460.3 [P.O.:200; I.V.:110.3; IV Piggyback:150] Out: -    Intake/Output this shift:  No intake/output data recorded. GEN: lying in bed, no distress CVS- RRR RS- no iwob, bilateral chest rise ABD- BS present soft non-distended EXT- no edema Neuro - does not answers questions, lethargic GU - purewick  Basic Metabolic Panel: Recent Labs  Lab 08/22/21 0504 08/23/21 0443 08/24/21 0430 08/25/21 0407 08/26/21 0103 08/26/21 0435 08/27/21 0400 08/28/21 0633  NA 136 140 141 143 144  --  144 141  K 3.7 3.7 3.6 3.8 3.2*  --  2.8* 3.0*  CL 101 102 103 104 101  --  101 100  CO2 24 27 27 29 29   --  29 28  GLUCOSE 149* 130* 96 146* 203*  --  220* 151*  BUN 57* 40* 55* 72* 73*  --  76* 79*  CREATININE 3.71* 2.81* 3.12* 3.65* 3.54*  --  3.37* 3.88*  CALCIUM 8.8* 8.3* 8.7* 8.8* 9.6  --  9.3 9.1  MG 2.0 2.0  --   --   --  2.0 1.6* 2.4  PHOS 5.3* 3.5 3.8 4.8* 3.3  --  3.4  3.4 4.1    Liver Function Tests: Recent Labs  Lab 08/24/21 0430 08/25/21 0407 08/26/21 0103 08/27/21 0400 08/28/21 0633  ALBUMIN 2.3* 2.4* 2.4* 2.3* 2.3*   No results for input(s): LIPASE, AMYLASE in the last 168 hours. Recent Labs  Lab 08/24/21 1718  AMMONIA 25    CBC: Recent Labs  Lab 08/25/21 0407 08/26/21 0103 08/26/21 0435 08/27/21 0400 08/28/21 0633  WBC 5.4 7.5 7.5 8.3 9.2  HGB 8.1* 9.6* 9.7*  9.2* 9.3*  HCT 25.7* 30.2* 31.0* 29.4* 29.3*  MCV 95.9 93.8 94.5 94.2 93.9  PLT 114* 150 163 178 206    Cardiac Enzymes: Recent Labs  Lab 08/27/21 0400  CKTOTAL 71    BNP: Invalid input(s): POCBNP  CBG: Recent Labs  Lab 08/27/21 0620 08/27/21 0631 08/27/21 1134 08/27/21 1813 08/27/21 2333  GLUCAP 204* 196* 136* 178* 153*    Microbiology: Results for orders placed or performed during the hospital encounter of 08/16/21  Resp Panel by RT-PCR (Flu A&B, Covid) Nasopharyngeal Swab     Status: None   Collection Time: 08/16/21  8:28 PM   Specimen: Nasopharyngeal Swab; Nasopharyngeal(NP) swabs in vial transport medium  Result Value Ref Range Status   SARS Coronavirus 2 by RT PCR NEGATIVE NEGATIVE Final    Comment: (NOTE) SARS-CoV-2 target nucleic acids are NOT DETECTED.  The SARS-CoV-2 RNA is generally detectable in upper respiratory specimens during the acute phase of infection. The lowest concentration of SARS-CoV-2 viral copies this assay can detect is 138 copies/mL. A negative result does not preclude SARS-Cov-2 infection and should not be used as the sole basis for treatment or other patient management decisions. A negative result  may occur with  improper specimen collection/handling, submission of specimen other than nasopharyngeal swab, presence of viral mutation(s) within the areas targeted by this assay, and inadequate number of viral copies(<138 copies/mL). A negative result must be combined with clinical observations, patient history, and epidemiological information. The expected result is Negative.  Fact Sheet for Patients:  EntrepreneurPulse.com.au  Fact Sheet for Healthcare Providers:  IncredibleEmployment.be  This test is no t yet approved or cleared by the [place] FDA and  has been authorized for detection and/or diagnosis of SARS-CoV-2 by FDA under an Emergency Use Authorization (EUA). This EUA will remain  in  effect (meaning this test can be used) for the duration of the COVID-19 declaration under Section 564(b)(1) of the Act, 21 U.S.C.section 360bbb-3(b)(1), unless the authorization is terminated  or revoked sooner.       Influenza A by PCR NEGATIVE NEGATIVE Final   Influenza B by PCR NEGATIVE NEGATIVE Final    Comment: (NOTE) The Xpert Xpress SARS-CoV-2/FLU/RSV plus assay is intended as an aid in the diagnosis of influenza from Nasopharyngeal swab specimens and should not be used as a sole basis for treatment. Nasal washings and aspirates are unacceptable for Xpert Xpress SARS-CoV-2/FLU/RSV testing.  Fact Sheet for Patients: EntrepreneurPulse.com.au  Fact Sheet for Healthcare Providers: IncredibleEmployment.be  This test is not yet approved or cleared by the [place] FDA and has been authorized for detection and/or diagnosis of SARS-CoV-2 by FDA under an Emergency Use Authorization (EUA). This EUA will remain in effect (meaning this test can be used) for the duration of the COVID-19 declaration under Section 564(b)(1) of the Act, 21 U.S.C. section 360bbb-3(b)(1), unless the authorization is terminated or revoked.  Performed at [facility], [address], [place], [place] [address]   Urine Culture     Status: None   Collection Time: 08/17/21 11:00 AM   Specimen: Urine, Clean Catch  Result Value Ref Range Status   Specimen Description   Final    URINE, CLEAN CATCH Performed at [facility], [address], [place], [place] [address]    Special Requests   Final    NONE Performed at [facility], [name], [place], [place] [address]    Culture   Final    NO GROWTH Performed at [facility], [place] [address] [place], [place] [address]    Report Status 08/19/2021 FINAL  Final  Culture, blood (Routine X 2) w Reflex to ID Panel     Status: None   Collection Time: 08/19/21  7:28 AM   Specimen: BLOOD LEFT ARM  Result Value Ref  Range Status   Specimen Description BLOOD LEFT ARM BOTTLES DRAWN AEROBIC AND ANAEROBIC  Final   Special Requests   Final    Blood Culture results may not be optimal due to an excessive volume of blood received in culture bottles   Culture   Final    NO GROWTH 5 DAYS Performed at [facility], [address] [place], [place] [address]    Report Status 08/24/2021 FINAL  Final  Culture, blood (Routine X 2) w Reflex to ID Panel     Status: None   Collection Time: 08/19/21  8:27 AM   Specimen: BLOOD LEFT HAND  Result Value Ref Range Status   Specimen Description   Final    BLOOD LEFT HAND BOTTLES DRAWN AEROBIC AND ANAEROBIC   Special Requests Blood Culture adequate volume  Final   Culture   Final    NO GROWTH  5 DAYS Performed at [facility], [address], [place], [place] [address]    Report Status 08/24/2021 FINAL  Final  MRSA Next Gen by PCR, Nasal     Status: Abnormal   Collection Time: 08/21/21  5:40 PM   Specimen: Nasal Mucosa; Nasal Swab  Result Value Ref Range Status   MRSA by PCR Next Gen DETECTED (A) NOT DETECTED Final    Comment: RESULT CALLED TO, READ BACK BY AND VERIFIED WITH: JESSICA HEARN RN,2021,08/21/2021, SELF S. (NOTE) The GeneXpert MRSA Assay (FDA approved for NASAL specimens only), is one component of a comprehensive MRSA colonization surveillance program. It is not intended to diagnose MRSA infection nor to guide or monitor treatment for MRSA infections. Test performance is not FDA approved in patients less than 19 years old. Performed at [facility], [address] [place], [place] [address]   MRSA Next Gen by PCR, Nasal     Status: Abnormal   Collection Time: 08/26/21 12:49 AM   Specimen: Nasal Mucosa; Nasal Swab  Result Value Ref Range Status   MRSA by PCR Next Gen DETECTED (A) NOT DETECTED Final    Comment: RESULT CALLED TO, READ BACK BY AND VERIFIED WITH: RN DAVID S.  [id number] FCP (NOTE) The GeneXpert MRSA Assay (FDA approved for NASAL specimens  only), is one component of a comprehensive MRSA colonization surveillance program. It is not intended to diagnose MRSA infection nor to guide or monitor treatment for MRSA infections. Test performance is not FDA approved in patients less than 37 years old. Performed at [facility], [place] [address], [place], [place] [address]   Respiratory (~20 pathogens) panel by PCR     Status: None   Collection Time: 08/26/21  9:31 AM   Specimen: Nasopharyngeal Swab; Respiratory  Result Value Ref Range Status   Adenovirus NOT DETECTED NOT DETECTED Final   Coronavirus 229E NOT DETECTED NOT DETECTED Final    Comment: (NOTE) The Coronavirus on the Respiratory Panel, DOES NOT test for the novel  Coronavirus (2019 nCoV)    Coronavirus HKU1 NOT DETECTED NOT DETECTED Final   Coronavirus NL63 NOT DETECTED NOT DETECTED Final   Coronavirus OC43 NOT DETECTED NOT DETECTED Final   Metapneumovirus NOT DETECTED NOT DETECTED Final   Rhinovirus / Enterovirus NOT DETECTED NOT DETECTED Final   Influenza A NOT DETECTED NOT DETECTED Final   Influenza B NOT DETECTED NOT DETECTED Final   Parainfluenza Virus 1 NOT DETECTED NOT DETECTED Final   Parainfluenza Virus 2 NOT DETECTED NOT DETECTED Final   Parainfluenza Virus 3 NOT DETECTED NOT DETECTED Final   Parainfluenza Virus 4 NOT DETECTED NOT DETECTED Final   Respiratory Syncytial Virus NOT DETECTED NOT DETECTED Final   Bordetella pertussis NOT DETECTED NOT DETECTED Final   Bordetella Parapertussis NOT DETECTED NOT DETECTED Final   Chlamydophila pneumoniae NOT DETECTED NOT DETECTED Final   Mycoplasma pneumoniae NOT DETECTED NOT DETECTED Final    Comment: Performed at [facility], [place]. [address], [place], [place] [address]    Coagulation Studies: Recent Labs    08/26/21 1343  LABPROT 15.6*  INR 1.2    Urinalysis: No results for input(s): COLORURINE, LABSPEC, PHURINE, GLUCOSEU, HGBUR, BILIRUBINUR, KETONESUR, PROTEINUR, UROBILINOGEN, NITRITE,  LEUKOCYTESUR in the last 72 hours.  Invalid input(s): APPERANCEUR    Imaging: MR BRAIN WO CONTRAST  Result Date: 08/27/2021 CLINICAL DATA:  Delirium.  Patient motion despite Ativan sedation EXAM: MRI HEAD WITHOUT CONTRAST TECHNIQUE: Multiplanar, multiecho pulse sequences of the brain  and surrounding structures were obtained without intravenous contrast. COMPARISON:  08/16/2021 FINDINGS: Brain: Small acute or early subacute infarcts in the left cerebellum and left frontal operculum since prior. No acute hemorrhage, hydrocephalus, or mass/finding. Remote bilateral globus pallidus insult. Slightly hyperintense left posterior putamen but no restricted diffusion or swelling, likely stable in from remote insult. Vascular: Major flow voids are preserved. Mild chronic small vessel ischemia in the hemispheric white matter Skull and upper cervical spine: Normal marrow signal.  C3-4 ACDF Sinuses/Orbits: Negative IMPRESSION: Very motion degraded study but still positive for small acute infarcts in the left cerebellum and left frontal operculum since study 11 days ago. Electronically Signed   By: Jorje Guild M.D.   On: 08/27/2021 11:20   DG Chest Port 1 View  Result Date: 08/27/2021 CLINICAL DATA:  Follow-up pulmonary airspace process. EXAM: PORTABLE CHEST 1 VIEW COMPARISON:  Chest x-ray 08/21/2021 and chest CT 08/17/2021 FINDINGS: The right IJ central venous catheter is stable. The cardiac silhouette, mediastinal and hilar contours are within normal limits unchanged. Much improved lung aeration with resolving interstitial and airspace process, likely resolving pulmonary edema. No pleural effusions. No pneumothorax. IMPRESSION: Much improved lung aeration, likely resolving pulmonary edema. Electronically Signed   By: Marijo Sanes M.D.   On: 08/27/2021 12:14   EEG adult  Result Date: 08/26/2021 Derek Jack, MD     08/26/2021  4:19 PM Routine EEG Report KAMY POINSETT is a 70 y.o. female with a  history of encephalopathy who is undergoing an EEG to evaluate for seizures. Report: This EEG was acquired with electrodes placed according to the International 10-20 electrode system (including Fp1, Fp2, F3, F4, C3, C4, P3, P4, O1, O2, T3, T4, T5, T6, A1, A2, Fz, Cz, Pz). The following electrodes were missing or displaced: none. The occipital dominant rhythm was 7-8 Hz with overriding beta frequencies. This activity is reactive to stimulation. Drowsiness was manifested by background fragmentation; deeper stages of sleep were not identified. There was no focal slowing. There were no interictal epileptiform discharges. There were no electrographic seizures identified. Photic stimulation and hyperventilation were not performed. Impression and clinical correlation: This EEG was obtained while awake and drowsy and is abnormal due to mild diffuse slowing indicative of global cerebral dysfunction. No epileptiform abnormalities were observed during this recording. Su Monks, MD [facility] [phone] If 7pm- 7am, please page neurology on call as listed in [place].   VAS US CAROTID  Result Date: 08/28/2021 Carotid Arterial Duplex Study Patient Name:  YALITZA TEED  Date of Exam:   08/28/2021 Medical Rec #: [id number]            Accession #:    [id number] Date of Birth: [birth year]            Patient Gender: F Patient Age:   19 years Exam Location:  [facility] Procedure:      VAS US CAROTID Referring Phys: Alferd Patee [facility] --------------------------------------------------------------------------------  Indications:       CVA. Risk Factors:      Hypertension, hyperlipidemia, Diabetes, prior CVA. Limitations        Today's exam was limited due to a central line. Comparison Study:  no prior Performing Technologist: Archie Patten RVS  Examination Guidelines: A complete evaluation includes B-mode imaging, spectral Doppler, color Doppler, and power Doppler as needed of all accessible portions  of each vessel. Bilateral testing is considered an integral part of a complete examination. Limited examinations for reoccurring indications may be performed as noted.  Right Carotid Findings: +----------+--------+--------+--------+------------------+---------------------+           PSV cm/sEDV cm/sStenosisPlaque DescriptionComments              +----------+--------+--------+--------+------------------+---------------------+ CCA Prox                                            not visualized due to                                                     central neck line     +----------+--------+--------+--------+------------------+---------------------+ CCA Distal                                          not visualized due to                                                     central neck line     +----------+--------+--------+--------+------------------+---------------------+ ICA Prox                                            not visualized due to                                                     central neck line     +----------+--------+--------+--------+------------------+---------------------+ ICA Distal                                          not visualized due to                                                     central neck line     +----------+--------+--------+--------+------------------+---------------------+ ECA                                                 not visualized due to                                                     central neck line     +----------+--------+--------+--------+------------------+---------------------+ +---------+--------+--------+---------------------------------------+ VertebralPSV cm/sEDV cm/snot visualized due to central neck line +---------+--------+--------+---------------------------------------+  Left Carotid Findings:  +----------+--------+--------+--------+------------------+---------------------+  PSV cm/sEDV cm/sStenosisPlaque DescriptionComments              +----------+--------+--------+--------+------------------+---------------------+ CCA Prox  102     15              homogeneous                             +----------+--------+--------+--------+------------------+---------------------+ CCA Distal132     18              heterogenous                            +----------+--------+--------+--------+------------------+---------------------+ ICA Prox  110     26      1-39%   heterogenous      Velocities may                                                            underestimate degree                                                      of stenosis due to                                                        more proximal                                                             obstruction.          +----------+--------+--------+--------+------------------+---------------------+ ICA Distal115     25                                                      +----------+--------+--------+--------+------------------+---------------------+ ECA       220                                                             +----------+--------+--------+--------+------------------+---------------------+ +----------+--------+--------+--------+-------------------+           PSV cm/sEDV cm/sDescribeArm Pressure (mmHG) +----------+--------+--------+--------+-------------------+ [id number]                                         +----------+--------+--------+--------+-------------------+ +---------+--------+--+--------+--+---------+ VertebralPSV cm/s56EDV cm/s10Antegrade +---------+--------+--+--------+--+---------+   Summary: Right Carotid: Not visualized due to central neck line. Left Carotid: Velocities in  the left ICA are consistent with a  1-39% stenosis. Vertebrals: Left vertebral artery demonstrates antegrade flow. *See table(s) above for measurements and observations.     Preliminary    VAS [place] LOWER EXTREMITY VENOUS (DVT)  Result Date: 08/28/2021  Lower Venous DVT Study Patient Name:  KAYDA ALLERS  Date of Exam:   08/28/2021 Medical Rec #: [id number]            Accession #:    [id number] Date of Birth: 08-Mar-1951            Patient Gender: F Patient Age:   83 years Exam Location:  [facility] Procedure:      VAS [place] LOWER EXTREMITY VENOUS (DVT) Referring Phys: Cornelius Moras XU --------------------------------------------------------------------------------  Indications: Stroke.  Comparison Study: no prior Performing Technologist: Archie Patten RVS  Examination Guidelines: A complete evaluation includes B-mode imaging, spectral Doppler, color Doppler, and power Doppler as needed of all accessible portions of each vessel. Bilateral testing is considered an integral part of a complete examination. Limited examinations for reoccurring indications may be performed as noted. The reflux portion of the exam is performed with the patient in reverse Trendelenburg.  +---------+---------------+---------+-----------+----------+--------------+ RIGHT    CompressibilityPhasicitySpontaneityPropertiesThrombus Aging +---------+---------------+---------+-----------+----------+--------------+ CFV      Full           Yes      Yes                                 +---------+---------------+---------+-----------+----------+--------------+ SFJ      Full                                                        +---------+---------------+---------+-----------+----------+--------------+ FV Prox  Full                                                        +---------+---------------+---------+-----------+----------+--------------+ FV Mid   Full                                                         +---------+---------------+---------+-----------+----------+--------------+ FV DistalFull                                                        +---------+---------------+---------+-----------+----------+--------------+ PFV      Full                                                        +---------+---------------+---------+-----------+----------+--------------+ POP      Full           Yes      Yes                                 +---------+---------------+---------+-----------+----------+--------------+  PTV      Full                                                        +---------+---------------+---------+-----------+----------+--------------+ PERO     Full                                                        +---------+---------------+---------+-----------+----------+--------------+   +---------+---------------+---------+-----------+----------+--------------+ LEFT     CompressibilityPhasicitySpontaneityPropertiesThrombus Aging +---------+---------------+---------+-----------+----------+--------------+ CFV      Full           Yes      Yes                                 +---------+---------------+---------+-----------+----------+--------------+ SFJ      Full                                                        +---------+---------------+---------+-----------+----------+--------------+ FV Prox  Full                                                        +---------+---------------+---------+-----------+----------+--------------+ FV Mid   Full                                                        +---------+---------------+---------+-----------+----------+--------------+ FV DistalFull                                                        +---------+---------------+---------+-----------+----------+--------------+ PFV      Full                                                         +---------+---------------+---------+-----------+----------+--------------+ POP      Full           Yes      Yes                                 +---------+---------------+---------+-----------+----------+--------------+ PTV      Full                                                        +---------+---------------+---------+-----------+----------+--------------+  PERO     Full                                                        +---------+---------------+---------+-----------+----------+--------------+     Summary: BILATERAL: - No evidence of deep vein thrombosis seen in the lower extremities, bilaterally. -No evidence of popliteal cyst, bilaterally.   *See table(s) above for measurements and observations.    Preliminary      Medications:    sodium chloride     sodium chloride     sodium chloride 250 mL (08/26/21 1235)   cefTRIAXone (ROCEPHIN)  IV 2 g (08/28/21 0950)   thiamine injection 500 mg (08/27/21 1405)    allopurinol  100 mg Oral Daily   atorvastatin  80 mg Oral QHS   Chlorhexidine Gluconate Cloth  6 each Topical Daily   diltiazem  60 mg Oral Q8H   hydrALAZINE  50 mg Oral Q8H   insulin aspart  0-9 Units Subcutaneous TID WC   insulin detemir  15 Units Subcutaneous QHS   labetalol  200 mg Oral BID   lipase/protease/amylase  36,000-72,000 Units Oral See admin instructions   pantoprazole  40 mg Oral Daily   potassium chloride  40 mEq Oral Once   sodium chloride flush  3 mL Intravenous Q12H   venlafaxine XR  75 mg Oral Daily   sodium chloride, sodium chloride, sodium chloride, acetaminophen, diphenhydrAMINE, diphenoxylate-atropine, haloperidol lactate, hydrALAZINE, HYDROmorphone (DILAUDID) injection, HYDROmorphone, ipratropium-albuterol, LORazepam, meclizine, ondansetron **OR** ondansetron (ZOFRAN) IV, sodium chloride flush, temazepam  Assessment/ Plan:  AKI/CKD stage IIIb - in setting of PNA and hypertensive urgency and chronic diarrhea.  Baseline Scr variable  1.6-2.2 and follows Dr. Theador Hawthorne as an outpatient. Required HD 10/25 due to hyperkalemia with UF of 2 liters.   Serologies negative   high K/L light chains noted; will check SPEP and UPEP.  Noted proteinuria that is not new; f/u reults Urine output was noted to increase but not well documented over the past 24 hours.  Does not appear volume overloaded.  Given creatinine rise today we will give IV hydration for 10 hours. Maintain temp HD catheter for now but if continued improvement in coming days will d/c Acute hypoxic respiratory failure - due to multifocal pneumonia vs CHF.  Currently on Abx per primary.   last dialysis 10/25 with 2 L removed. Negative balance noted . V/Q negative   HTN urgency/emergency -Has diltiazem, labetalol and hydralazine po, IV prn metoprolol and hydralazine.  BP extremely labile in last 24h with several med changes noted -- no further changes for now but hopefully can get on a simplified regimen.   AMS  waxing and waning; neurology evaluating  Hypomagnesemia - likely due to chronic diarrhea. Hypokalemia: Likely multifactorial.  Refused supplementation yesterday.  Retry 40 mEq today Anemia of critical illness - s/p transfusion.  Continue to follow H/H.  Thrombocytopenia -now normalized.  Possibly was related to hypertension    LOS: 11

## 2021-08-28 NOTE — Progress Notes (Signed)
PROGRESS NOTE    Heather [name]  DGU:[id number] DOB: [birth year] DOA: 08/16/2021 PCP: [name], MD    Brief Narrative:  This 70 year old female with history of CKD stage IIIb, Crohn's disease and IBS with chronic diarrhea, hypertension, type 2 diabetes, BPPV who presented to the emergency room with blurry vision and diplopia for 2 days associated  with stabbing headache mostly on the back of her head.  She had urinary symptoms and was treated with doxycycline for last 7 days.  She also developed diffuse rash while taking it.  In the emergency room afebrile.  Heart rate normal.  Blood pressure systolic [phone].  Patient was given labetalol in the ER and blood pressure improved.  Neurology was consulted.  MRI of the brain and MRA/MRV was done and was essentially normal.  She was also found to be suffering with acute kidney injury on CKDIIIb.  She has required hemodialysis initiation on 10/23 with IJ catheter placement.  2 units PRBCs transfused on 10/23 as well for acute anemia.  She continues to have some ongoing confusion.  Nephrology continues to follow to determine need for long-term hemodialysis.  Assessment & Plan:   Principal Problem:   Blurry vision, bilateral Active Problems:   Essential hypertension   Type 2 diabetes with nephropathy (HCC)   Depression   Acute kidney injury superimposed on CKD (HCC)   Crohn's disease of small intestine with other complication (HCC)   Fusion of spine, cervicothoracic region   Hypomagnesemia   Acute hypokalemia   Acute respiratory failure with hypoxia (HCC)   Acute metabolic encephalopathy   Malignant hypertension   Thrombocytopenia (HCC)   Anemia due to chronic kidney disease   Pressure injury of skin   Cerebral thrombosis with cerebral infarction   Cerebrovascular accident (CVA) due to embolism of cerebral artery (HCC)  Acute hypoxic respiratory failure > multifactorial> Resolved. This could be due to multifocal pneumonia versus  acute diastolic CHF exacerbation. V/Q scan did not show any evidence of PE. She was initially on room air on admission but her oxygen requirement has gone up requiring 15 L of high flow oxygen but now weaned down to Room air.  Multifocal pneumonia: CT chest showed bilateral pulmonary infiltrate . Despite aggressive diuresis, infiltrates have persisted. Pulmonology consulted, since patient was taking Humira prior to admission, there is concern for atypical infection. MRSA PCR noted positive . Patient was started on cefepime and linezolid. Neurology recommended to change cefepime if possible. Cefepime changed to ceftriaxone. Continue ceftriaxone and vancomycin. Pulm :No indication for bronchoscopy at this time.   Acute diastolic congestive heart failure: Volume status was managed with hemodialysis as well as Lasix infusion. Her weight is down approximately 20 pounds since admission. Nephrology is following,  recommended to discontinue Lasix infusion.  Continue Lasix as needed. Appears Euvolemic.   Acute urinary retention: Continue Foley catheter.  Consider voiding trial.   Acute Encephalopathy:  > Improving Etiology is not entirely clear.  Uncontrolled severe hypertension could be contributing. Initial MRI unremarkable, mental status does appear to be intact at that time. Her husband does describe some periods of confusion at home, but these appear to be transient.   Review of notes from earlier in this hospitalization showed that she was awake, alert and conversant at that time.   Clearly her mental status had a progressive decline through this hospitalization. TSH, ammonia, ABG were noncontributory. ESR markedly elevated at 127. Connective tissue disease work-up has thus far been unremarkable. She might be having any  encephalitis Neurology consulted.  Recommended MRI brain, EEG, change cefepime if possible Thiamine 500 mg IV daily, consider lumbar puncture.  MRI : small acute  infarcts in the left cerebellum and left frontal operculum since study 11 days ago. Patient seems much improved.  She is alert oriented and following commands.  AKI on CKD stage IIIb: Patient had temporary dialysis catheter placed in right IJ. She underwent hemodialysis on 10/23 and 10/25. Renal ultrasound with No significant findings. Lasix infusion discontinued and continue lasix PRN. Further management as per nephrology.   Hypertensive urgency:  > Improved. Patient presented with headache and ocular symptoms associated with uncontrolled hypertension on admission. Subsequently, blood pressures were noted to be low which felt was contributing to her worsening renal failure. Patient had difficult to control BP readings for the last several days. Continue diltiazem, labetalol and hydralazine po and  as needed hydralazine  Likely need further titration of her antihypertensive medications. Blood pressure has much improved.   Acute on chronic anemia > stable. Could be related to CKD. S/p 2 unit PRBC transfusion on 10/23. Monitor for overt bleeding. Hb remains stable. Hb 9.3   Thrombocytopenia:  > Improved. HIT panel negative.   Acute diplopia and headaches: > Improved. Unclear etiology and assessed by neurology Likely was related to severe hypomagnesemia which had been corrected It was felt that her occipital headache may also be referred from her chronic cervical spine disease.   Non-anion gap metabolic acidosis It could be related to diarrhea and possibly AKI. Overall improved.   Type 2 diabetes:  Continue SSI and Levemir  Crohn's disease with chronic diarrhea Resume Humira outpatient. No diarrhea at present    DVT prophylaxis: Heparin Code Status: DNR Family Communication: No family at bed side. Disposition Plan:   Status is: Inpatient  Remains inpatient appropriate because:  Continued encephalopathy, uncontrolled blood pressure and persistent pneumonia requiring  further work-up  Anticipated discharge to SNF or home health services in few days   Consultants:  Nephrology Neurology Pulmonology PCCM  Procedures: Right IJ hemodialysis catheter 10/23 Antimicrobials:   Anti-infectives (From admission, onward)    Start     Dose/Rate Route Frequency Ordered Stop   08/28/21 1419  vancomycin variable dose per unstable renal function (pharmacist dosing)         Does not apply See admin instructions 08/28/21 1419     08/26/21 1515  vancomycin (VANCOREADY) IVPB 1250 mg/250 mL        1,250 mg 166.7 mL/hr over 90 Minutes Intravenous  Once 08/26/21 1427 08/26/21 1913   08/26/21 1200  ceFEPIme (MAXIPIME) 2 g in sodium chloride 0.9 % 100 mL IVPB  Status:  Discontinued        2 g 200 mL/hr over 30 Minutes Intravenous Every 24 hours 08/25/21 1710 08/26/21 0811   08/26/21 0900  cefTRIAXone (ROCEPHIN) 2 g in sodium chloride 0.9 % 100 mL IVPB        2 g 200 mL/hr over 30 Minutes Intravenous Every 24 hours 08/26/21 0811     08/23/21 1000  linezolid (ZYVOX) tablet 600 mg  Status:  Discontinued        600 mg Oral Every 12 hours 08/22/21 1038 08/26/21 0959   08/23/21 0900  ceFEPIme (MAXIPIME) 2 g in sodium chloride 0.9 % 100 mL IVPB        2 g 200 mL/hr over 30 Minutes Intravenous Every 24 hours 08/23/21 0736 08/25/21 1900   08/19/21 0745  ceFEPIme (MAXIPIME) 2 g in sodium  chloride 0.9 % 100 mL IVPB  Status:  Discontinued        2 g 200 mL/hr over 30 Minutes Intravenous Every 24 hours 08/19/21 0651 08/23/21 0736   08/19/21 0745  vancomycin (VANCOREADY) IVPB 750 mg/150 mL  Status:  Discontinued        750 mg 150 mL/hr over 60 Minutes Intravenous Every 48 hours 08/19/21 0651 08/22/21 1038   08/18/21 0945  fluconazole (DIFLUCAN) tablet 150 mg        150 mg Oral  Once 08/18/21 0254 08/18/21 0951        Subjective: Patient was seen and examined at bedside.  Overnight events noted.  Patient is alert and awake. Her blood pressure has finally improved. She is  alert,  oriented and following commands.   She seems much improved.  Objective: Vitals:   08/28/21 0123 08/28/21 0604 08/28/21 0941 08/28/21 1226  BP: 131/69 (!) 142/44 (!) 149/59 (!) 118/50  Pulse: 78 80 83 66  Resp: $Remo'15 20 20 18  'yWeJv$ Temp: 98.5 F (36.9 C) 97.8 F (36.6 C) 97.8 F (36.6 C) 98.1 F (36.7 C)  TempSrc: Oral Oral Oral Oral  SpO2: 96% 94% 97% 97%  Weight:  65.6 kg    Height:        Intake/Output Summary (Last 24 hours) at 08/28/2021 1426 Last data filed at 08/28/2021 1230 Gross per 24 hour  Intake 720 ml  Output --  Net 720 ml   Filed Weights   08/26/21 0500 08/27/21 0520 08/28/21 0604  Weight: 63.9 kg 67.3 kg 65.6 kg    Examination:  General exam: Appears comfortable,chronically ill looking,  following commands.   Respiratory system: Clear to auscultation. Respiratory effort normal. RR 14 Cardiovascular system: S1-S2 heard, regular rate and rhythm, no murmur.   Gastrointestinal system: Abdomen is soft, nontender, nondistended, BS + Central nervous system: Alert and oriented x 3, following  commands, No focal neurological deficits. Extremities: No edema, no cyanosis, no clubbing. Skin: No rashes, lesions or ulcers Psychiatry: Judgement and insight appear normal. Mood & affect appropriate.     Data Reviewed: I have personally reviewed following labs and imaging studies  CBC: Recent Labs  Lab 08/25/21 0407 08/26/21 0103 08/26/21 0435 08/27/21 0400 08/28/21 0633  WBC 5.4 7.5 7.5 8.3 9.2  HGB 8.1* 9.6* 9.7* 9.2* 9.3*  HCT 25.7* 30.2* 31.0* 29.4* 29.3*  MCV 95.9 93.8 94.5 94.2 93.9  PLT 114* 150 163 178 270   Basic Metabolic Panel: Recent Labs  Lab 08/22/21 0504 08/23/21 0443 08/24/21 0430 08/25/21 0407 08/26/21 0103 08/26/21 0435 08/27/21 0400 08/28/21 0633  NA 136 140 141 143 144  --  144 141  K 3.7 3.7 3.6 3.8 3.2*  --  2.8* 3.0*  CL 101 102 103 104 101  --  101 100  CO2 $Re'24 27 27 29 29  'HTO$ --  29 28  GLUCOSE 149* 130* 96 146* 203*  --   220* 151*  BUN 57* 40* 55* 72* 73*  --  76* 79*  CREATININE 3.71* 2.81* 3.12* 3.65* 3.54*  --  3.37* 3.88*  CALCIUM 8.8* 8.3* 8.7* 8.8* 9.6  --  9.3 9.1  MG 2.0 2.0  --   --   --  2.0 1.6* 2.4  PHOS 5.3* 3.5 3.8 4.8* 3.3  --  3.4  3.4 4.1   GFR: Estimated Creatinine Clearance: 11.8 mL/min (A) (by C-G formula based on SCr of 3.88 mg/dL (H)). Liver Function Tests: Recent Labs  Lab 08/24/21  0430 08/25/21 0407 08/26/21 0103 08/27/21 0400 08/28/21 0633  ALBUMIN 2.3* 2.4* 2.4* 2.3* 2.3*   No results for input(s): LIPASE, AMYLASE in the last 168 hours. Recent Labs  Lab 08/24/21 1718  AMMONIA 25   Coagulation Profile: Recent Labs  Lab 08/23/21 1223 08/26/21 1343  INR 1.3* 1.2   Cardiac Enzymes: Recent Labs  Lab 08/27/21 0400  CKTOTAL 71   BNP (last 3 results) No results for input(s): PROBNP in the last 8760 hours. HbA1C: Recent Labs    08/27/21 1558  HGBA1C 6.1*   CBG: Recent Labs  Lab 08/27/21 0631 08/27/21 1134 08/27/21 1813 08/27/21 2333 08/28/21 1217  GLUCAP 196* 136* 178* 153* 83   Lipid Profile: Recent Labs    08/27/21 1557  CHOL 107  HDL 32*  LDLCALC 40  TRIG 175*  CHOLHDL 3.3   Thyroid Function Tests: No results for input(s): TSH, T4TOTAL, FREET4, T3FREE, THYROIDAB in the last 72 hours. Anemia Panel: Recent Labs    08/27/21 1529  VITAMINB12 1,417*   Sepsis Labs: No results for input(s): PROCALCITON, LATICACIDVEN in the last 168 hours.  Recent Results (from the past 240 hour(s))  Culture, blood (Routine X 2) w Reflex to ID Panel     Status: None   Collection Time: 08/19/21  7:28 AM   Specimen: BLOOD LEFT ARM  Result Value Ref Range Status   Specimen Description BLOOD LEFT ARM BOTTLES DRAWN AEROBIC AND ANAEROBIC  Final   Special Requests   Final    Blood Culture results may not be optimal due to an excessive volume of blood received in culture bottles   Culture   Final    NO GROWTH 5 DAYS Performed at [facility], [address], [place], [place] [address]    Report Status 08/24/2021 FINAL  Final  Culture, blood (Routine X 2) w Reflex to ID Panel     Status: None   Collection Time: 08/19/21  8:27 AM   Specimen: BLOOD LEFT HAND  Result Value Ref Range Status   Specimen Description   Final    BLOOD LEFT HAND BOTTLES DRAWN AEROBIC AND ANAEROBIC   Special Requests Blood Culture adequate volume  Final   Culture   Final    NO GROWTH 5 DAYS Performed at [facility], [address], [place], [place] [address]    Report Status 08/24/2021 FINAL  Final  MRSA Next Gen by PCR, Nasal     Status: Abnormal   Collection Time: 08/21/21  5:40 PM   Specimen: Nasal Mucosa; Nasal Swab  Result Value Ref Range Status   MRSA by PCR Next Gen DETECTED (A) NOT DETECTED Final    Comment: RESULT CALLED TO, READ BACK BY AND VERIFIED WITH: JESSICA HEARN RN,2021,08/21/2021, SELF S. (NOTE) The GeneXpert MRSA Assay (FDA approved for NASAL specimens only), is one component of a comprehensive MRSA colonization surveillance program. It is not intended to diagnose MRSA infection nor to guide or monitor treatment for MRSA infections. Test performance is not FDA approved in patients less than 63 years old. Performed at [facility], [address] [place], [place] [address]   MRSA Next Gen by PCR, Nasal     Status: Abnormal   Collection Time: 08/26/21 12:49 AM   Specimen: Nasal Mucosa; Nasal Swab  Result Value Ref Range Status   MRSA by PCR Next Gen DETECTED (A) NOT DETECTED Final    Comment: RESULT CALLED TO, READ BACK BY AND VERIFIED WITH: RN DAVID S.  [phone]  FCP (NOTE) The GeneXpert MRSA Assay (FDA approved for NASAL specimens only), is one component of a comprehensive MRSA colonization surveillance program. It is not intended to diagnose MRSA infection nor to guide or monitor treatment for MRSA infections. Test performance is not FDA approved in patients less than 58 years old. Performed at [facility], [place] [address]  [place], [place] [address]   Respiratory (~20 pathogens) panel by PCR     Status: None   Collection Time: 08/26/21  9:31 AM   Specimen: Nasopharyngeal Swab; Respiratory  Result Value Ref Range Status   Adenovirus NOT DETECTED NOT DETECTED Final   Coronavirus 229E NOT DETECTED NOT DETECTED Final    Comment: (NOTE) The Coronavirus on the Respiratory Panel, DOES NOT test for the novel  Coronavirus (2019 nCoV)    Coronavirus HKU1 NOT DETECTED NOT DETECTED Final   Coronavirus NL63 NOT DETECTED NOT DETECTED Final   Coronavirus OC43 NOT DETECTED NOT DETECTED Final   Metapneumovirus NOT DETECTED NOT DETECTED Final   Rhinovirus / Enterovirus NOT DETECTED NOT DETECTED Final   Influenza A NOT DETECTED NOT DETECTED Final   Influenza B NOT DETECTED NOT DETECTED Final   Parainfluenza Virus 1 NOT DETECTED NOT DETECTED Final   Parainfluenza Virus 2 NOT DETECTED NOT DETECTED Final   Parainfluenza Virus 3 NOT DETECTED NOT DETECTED Final   Parainfluenza Virus 4 NOT DETECTED NOT DETECTED Final   Respiratory Syncytial Virus NOT DETECTED NOT DETECTED Final   Bordetella pertussis NOT DETECTED NOT DETECTED Final   Bordetella Parapertussis NOT DETECTED NOT DETECTED Final   Chlamydophila pneumoniae NOT DETECTED NOT DETECTED Final   Mycoplasma pneumoniae NOT DETECTED NOT DETECTED Final    Comment: Performed at [facility], [place]. [address] [place], [place] [address]         Radiology Studies: MR BRAIN WO CONTRAST  Result Date: 08/27/2021 CLINICAL DATA:  Delirium.  Patient motion despite Ativan sedation EXAM: MRI HEAD WITHOUT CONTRAST TECHNIQUE: Multiplanar, multiecho pulse sequences of the brain and surrounding structures were obtained without intravenous contrast. COMPARISON:  08/16/2021 FINDINGS: Brain: Small acute or early subacute infarcts in the left cerebellum and left frontal operculum since prior. No acute hemorrhage, hydrocephalus, or mass/finding. Remote bilateral globus pallidus insult.  Slightly hyperintense left posterior putamen but no restricted diffusion or swelling, likely stable in from remote insult. Vascular: Major flow voids are preserved. Mild chronic small vessel ischemia in the hemispheric white matter Skull and upper cervical spine: Normal marrow signal.  C3-4 ACDF Sinuses/Orbits: Negative IMPRESSION: Very motion degraded study but still positive for small acute infarcts in the left cerebellum and left frontal operculum since study 11 days ago. Electronically Signed   By: Jorje Guild M.D.   On: 08/27/2021 11:20   DG Chest Port 1 View  Result Date: 08/27/2021 CLINICAL DATA:  Follow-up pulmonary airspace process. EXAM: PORTABLE CHEST 1 VIEW COMPARISON:  Chest x-ray 08/21/2021 and chest CT 08/17/2021 FINDINGS: The right IJ central venous catheter is stable. The cardiac silhouette, mediastinal and hilar contours are within normal limits unchanged. Much improved lung aeration with resolving interstitial and airspace process, likely resolving pulmonary edema. No pleural effusions. No pneumothorax. IMPRESSION: Much improved lung aeration, likely resolving pulmonary edema. Electronically Signed   By: Marijo Sanes M.D.   On: 08/27/2021 12:14   EEG adult  Result Date: 08/26/2021 Derek Jack, MD     08/26/2021  4:19 PM Routine EEG Report TIFINI REEDER is a 70 y.o. female with a  history of encephalopathy who is undergoing an EEG to evaluate for seizures. Report: This EEG was acquired with electrodes placed according to the International 10-20 electrode system (including Fp1, Fp2, F3, F4, C3, C4, P3, P4, O1, O2, T3, T4, T5, T6, A1, A2, Fz, Cz, Pz). The following electrodes were missing or displaced: none. The occipital dominant rhythm was 7-8 Hz with overriding beta frequencies. This activity is reactive to stimulation. Drowsiness was manifested by background fragmentation; deeper stages of sleep were not identified. There was no focal slowing. There were no interictal  epileptiform discharges. There were no electrographic seizures identified. Photic stimulation and hyperventilation were not performed. Impression and clinical correlation: This EEG was obtained while awake and drowsy and is abnormal due to mild diffuse slowing indicative of global cerebral dysfunction. No epileptiform abnormalities were observed during this recording. Bing Neighbors, MD [facility] [phone] If 7pm- 7am, please page neurology on call as listed in AMION.   VAS US CAROTID  Result Date: 08/28/2021 Carotid Arterial Duplex Study Patient Name:  Heather [name]  Date of Exam:   08/28/2021 Medical Rec #: [id number]            Accession #:    [id number] Date of Birth: 1950-12-03            Patient Gender: F Patient Age:   62 years Exam Location:  [facility] Procedure:      VAS US CAROTID Referring Phys: Terrilee Files [facility] --------------------------------------------------------------------------------  Indications:       CVA. Risk Factors:      Hypertension, hyperlipidemia, Diabetes, prior CVA. Limitations        Today's exam was limited due to a central line. Comparison Study:  no prior Performing Technologist: Argentina Ponder RVS  Examination Guidelines: A complete evaluation includes B-mode imaging, spectral Doppler, color Doppler, and power Doppler as needed of all accessible portions of each vessel. Bilateral testing is considered an integral part of a complete examination. Limited examinations for reoccurring indications may be performed as noted.  Right Carotid Findings: +----------+--------+--------+--------+------------------+---------------------+           PSV cm/sEDV cm/sStenosisPlaque DescriptionComments              +----------+--------+--------+--------+------------------+---------------------+ CCA Prox                                            not visualized due to                                                     central neck line      +----------+--------+--------+--------+------------------+---------------------+ CCA Distal                                          not visualized due to                                                     central neck line     +----------+--------+--------+--------+------------------+---------------------+ ICA Prox  not visualized due to                                                     central neck line     +----------+--------+--------+--------+------------------+---------------------+ ICA Distal                                          not visualized due to                                                     central neck line     +----------+--------+--------+--------+------------------+---------------------+ ECA                                                 not visualized due to                                                     central neck line     +----------+--------+--------+--------+------------------+---------------------+ +---------+--------+--------+---------------------------------------+ VertebralPSV cm/sEDV cm/snot visualized due to central neck line +---------+--------+--------+---------------------------------------+  Left Carotid Findings: +----------+--------+--------+--------+------------------+---------------------+           PSV cm/sEDV cm/sStenosisPlaque DescriptionComments              +----------+--------+--------+--------+------------------+---------------------+ CCA Prox  102     15              homogeneous                             +----------+--------+--------+--------+------------------+---------------------+ CCA Distal132     18              heterogenous                            +----------+--------+--------+--------+------------------+---------------------+ ICA Prox  110     26      1-39%   heterogenous      Velocities may                                                             underestimate degree                                                      of stenosis due to  more proximal                                                             obstruction.          +----------+--------+--------+--------+------------------+---------------------+ ICA Distal115     25                                                      +----------+--------+--------+--------+------------------+---------------------+ ECA       220                                                             +----------+--------+--------+--------+------------------+---------------------+ +----------+--------+--------+--------+-------------------+           PSV cm/sEDV cm/sDescribeArm Pressure (mmHG) +----------+--------+--------+--------+-------------------+ [id number]                                         +----------+--------+--------+--------+-------------------+ +---------+--------+--+--------+--+---------+ VertebralPSV cm/s56EDV cm/s10Antegrade +---------+--------+--+--------+--+---------+   Summary: Right Carotid: Not visualized due to central neck line. Left Carotid: Velocities in the left ICA are consistent with a 1-39% stenosis. Vertebrals: Left vertebral artery demonstrates antegrade flow. *See table(s) above for measurements and observations.  Electronically signed by Rosalin Hawking MD on 08/28/2021 at 1:41:22 PM.    Final    VAS [place] LOWER EXTREMITY VENOUS (DVT)  Result Date: 08/28/2021  Lower Venous DVT Study Patient Name:  Heather [name]  Date of Exam:   08/28/2021 Medical Rec #: [id number]            Accession #:    [id number] Date of Birth: Jul 25, 1951            Patient Gender: F Patient Age:   5 years Exam Location:  [facility] Procedure:      VAS [place] LOWER EXTREMITY VENOUS (DVT) Referring Phys: Cornelius Moras XU  --------------------------------------------------------------------------------  Indications: Stroke.  Comparison Study: no prior Performing Technologist: Archie Patten RVS  Examination Guidelines: A complete evaluation includes B-mode imaging, spectral Doppler, color Doppler, and power Doppler as needed of all accessible portions of each vessel. Bilateral testing is considered an integral part of a complete examination. Limited examinations for reoccurring indications may be performed as noted. The reflux portion of the exam is performed with the patient in reverse Trendelenburg.  +---------+---------------+---------+-----------+----------+--------------+ RIGHT    CompressibilityPhasicitySpontaneityPropertiesThrombus Aging +---------+---------------+---------+-----------+----------+--------------+ CFV      Full           Yes      Yes                                 +---------+---------------+---------+-----------+----------+--------------+ SFJ      Full                                                        +---------+---------------+---------+-----------+----------+--------------+  FV Prox  Full                                                        +---------+---------------+---------+-----------+----------+--------------+ FV Mid   Full                                                        +---------+---------------+---------+-----------+----------+--------------+ FV DistalFull                                                        +---------+---------------+---------+-----------+----------+--------------+ PFV      Full                                                        +---------+---------------+---------+-----------+----------+--------------+ POP      Full           Yes      Yes                                 +---------+---------------+---------+-----------+----------+--------------+ PTV      Full                                                         +---------+---------------+---------+-----------+----------+--------------+ PERO     Full                                                        +---------+---------------+---------+-----------+----------+--------------+   +---------+---------------+---------+-----------+----------+--------------+ LEFT     CompressibilityPhasicitySpontaneityPropertiesThrombus Aging +---------+---------------+---------+-----------+----------+--------------+ CFV      Full           Yes      Yes                                 +---------+---------------+---------+-----------+----------+--------------+ SFJ      Full                                                        +---------+---------------+---------+-----------+----------+--------------+ FV Prox  Full                                                        +---------+---------------+---------+-----------+----------+--------------+  FV Mid   Full                                                        +---------+---------------+---------+-----------+----------+--------------+ FV DistalFull                                                        +---------+---------------+---------+-----------+----------+--------------+ PFV      Full                                                        +---------+---------------+---------+-----------+----------+--------------+ POP      Full           Yes      Yes                                 +---------+---------------+---------+-----------+----------+--------------+ PTV      Full                                                        +---------+---------------+---------+-----------+----------+--------------+ PERO     Full                                                        +---------+---------------+---------+-----------+----------+--------------+     Summary: BILATERAL: - No evidence of deep vein thrombosis seen in the lower extremities, bilaterally. -No  evidence of popliteal cyst, bilaterally.   *See table(s) above for measurements and observations.    Preliminary     Scheduled Meds:  allopurinol  100 mg Oral Daily   atorvastatin  80 mg Oral QHS   Chlorhexidine Gluconate Cloth  6 each Topical Daily   diltiazem  60 mg Oral Q8H   hydrALAZINE  50 mg Oral Q8H   insulin aspart  0-9 Units Subcutaneous TID WC   insulin detemir  15 Units Subcutaneous QHS   labetalol  200 mg Oral BID   lipase/protease/amylase  36,000-72,000 Units Oral See admin instructions   pantoprazole  40 mg Oral Daily   sodium chloride flush  3 mL Intravenous Q12H   vancomycin variable dose per unstable renal function (pharmacist dosing)   Does not apply See admin instructions   venlafaxine XR  75 mg Oral Daily   Continuous Infusions:  sodium chloride     sodium chloride     sodium chloride 250 mL (08/26/21 1235)   cefTRIAXone (ROCEPHIN)  IV 2 g (08/28/21 0950)   lactated ringers     thiamine injection 500 mg (08/28/21 1407)     LOS: 11 days    Time spent: 35 mins    Shawna Clamp, MD  [facility]   If 7PM-7AM, please contact night-coverage

## 2021-08-29 LAB — RENAL FUNCTION PANEL
Albumin: 2.2 g/dL — ABNORMAL LOW (ref 3.5–5.0)
Anion gap: 10 (ref 5–15)
BUN: 79 mg/dL — ABNORMAL HIGH (ref 8–23)
CO2: 24 mmol/L (ref 22–32)
Calcium: 8.4 mg/dL — ABNORMAL LOW (ref 8.9–10.3)
Chloride: 98 mmol/L (ref 98–111)
Creatinine, Ser: 4.32 mg/dL — ABNORMAL HIGH (ref 0.44–1.00)
GFR, Estimated: 11 mL/min — ABNORMAL LOW (ref >60)
Glucose, Bld: 264 mg/dL — ABNORMAL HIGH (ref 70–99)
Phosphorus: 4.1 mg/dL (ref 2.5–4.6)
Potassium: 3.4 mmol/L — ABNORMAL LOW (ref 3.5–5.1)
Sodium: 132 mmol/L — ABNORMAL LOW (ref 135–145)

## 2021-08-29 LAB — URINALYSIS, ROUTINE W REFLEX MICROSCOPIC
Bacteria, UA: NONE SEEN
Bilirubin Urine: NEGATIVE
Glucose, UA: NEGATIVE mg/dL
Ketones, ur: NEGATIVE mg/dL
Leukocytes,Ua: NEGATIVE
Nitrite: NEGATIVE
Protein, ur: 100 mg/dL — AB
Specific Gravity, Urine: 1.013 (ref 1.005–1.030)
pH: 5 (ref 5.0–8.0)

## 2021-08-29 LAB — CBC
HCT: 24.9 % — ABNORMAL LOW (ref 36.0–46.0)
Hemoglobin: 7.9 g/dL — ABNORMAL LOW (ref 12.0–15.0)
MCH: 29.7 pg (ref 26.0–34.0)
MCHC: 31.7 g/dL (ref 30.0–36.0)
MCV: 93.6 fL (ref 80.0–100.0)
Platelets: 201 10*3/uL (ref 150–400)
RBC: 2.66 MIL/uL — ABNORMAL LOW (ref 3.87–5.11)
RDW: 15.3 % (ref 11.5–15.5)
WBC: 9.6 10*3/uL (ref 4.0–10.5)
nRBC: 0 % (ref 0.0–0.2)

## 2021-08-29 LAB — GLUCOSE, CAPILLARY
Glucose-Capillary: 215 mg/dL — ABNORMAL HIGH (ref 70–99)
Glucose-Capillary: 236 mg/dL — ABNORMAL HIGH (ref 70–99)
Glucose-Capillary: 190 mg/dL — ABNORMAL HIGH (ref 70–99)
Glucose-Capillary: 243 mg/dL — ABNORMAL HIGH (ref 70–99)

## 2021-08-29 MED ORDER — POTASSIUM CHLORIDE 20 MEQ PO PACK
40.0000 meq | PACK | Freq: Once | ORAL | Status: AC
  Administered 2021-08-29: 40 meq via ORAL
  Filled 2021-08-29: qty 2

## 2021-08-29 NOTE — Progress Notes (Signed)
[facility] ROUNDING NOTE   Subjective:   Patient doing much better today.  Sitting up in bed eating breakfast.  Very conversational and Mind seems sharp.  She does not remember much of what has happened but very clear this morning.  Objective:  Vital signs in last 24 hours:  Temp:  [97.5 F (36.4 C)-98.1 F (36.7 C)] 98.1 F (36.7 C) (11/01 0740) Pulse Rate:  [66-76] 72 (11/01 0740) Resp:  [18-19] 18 (11/01 0740) BP: (115-141)/(45-51) 126/47 (11/01 0740) SpO2:  [97 %-99 %] 99 % (11/01 0740) Weight:  [68.6 kg] 68.6 kg (11/01 0341)  Weight change: 3 kg Filed Weights   08/27/21 0520 08/28/21 0604 08/29/21 0341  Weight: 67.3 kg 65.6 kg 68.6 kg    Intake/Output: I/O last 3 completed shifts: In: 1377.3 [P.O.:1200; I.V.:77.3; IV Piggyback:100] Out: -    Intake/Output this shift:  Total I/O In: 480 [P.O.:480] Out: 400 [Urine:400] GEN: sitting in bed, no distress CVS- RRR RS- no iwob, bilateral chest rise ABD- BS present soft non-distended EXT- no edema Neuro - awake, alert, oriented to person and place GU - purewick  Basic Metabolic Panel: Recent Labs  Lab 08/23/21 0443 08/24/21 0430 08/25/21 0407 08/26/21 0103 08/26/21 0435 08/27/21 0400 08/28/21 0633 08/29/21 0350  NA 140   < > 143 144  --  144 141 132*  K 3.7   < > 3.8 3.2*  --  2.8* 3.0* 3.4*  CL 102   < > 104 101  --  101 100 98  CO2 27   < > 29 29  --  29 28 24   GLUCOSE 130*   < > 146* 203*  --  220* 151* 264*  BUN 40*   < > 72* 73*  --  76* 79* 79*  CREATININE 2.81*   < > 3.65* 3.54*  --  3.37* 3.88* 4.32*  CALCIUM 8.3*   < > 8.8* 9.6  --  9.3 9.1 8.4*  MG 2.0  --   --   --  2.0 1.6* 2.4  --   PHOS 3.5   < > 4.8* 3.3  --  3.4  3.4 4.1 4.1   < > = values in this interval not displayed.    Liver Function Tests: Recent Labs  Lab 08/25/21 0407 08/26/21 0103 08/27/21 0400 08/28/21 0633 08/29/21 0350  ALBUMIN 2.4* 2.4* 2.3* 2.3* 2.2*   No results for input(s): LIPASE, AMYLASE in  the last 168 hours. Recent Labs  Lab 08/24/21 1718  AMMONIA 25    CBC: Recent Labs  Lab 08/26/21 0103 08/26/21 0435 08/27/21 0400 08/28/21 0633 08/29/21 0350  WBC 7.5 7.5 8.3 9.2 9.6  HGB 9.6* 9.7* 9.2* 9.3* 7.9*  HCT 30.2* 31.0* 29.4* 29.3* 24.9*  MCV 93.8 94.5 94.2 93.9 93.6  PLT 150 163 178 206 201    Cardiac Enzymes: Recent Labs  Lab 08/27/21 0400  CKTOTAL 71    BNP: Invalid input(s): POCBNP  CBG: Recent Labs  Lab 08/27/21 2333 08/28/21 1217 08/28/21 1801 08/28/21 2101 08/29/21 0605  GLUCAP 153* 83 148* 425* 215*    Microbiology: Results for orders placed or performed during the hospital encounter of 08/16/21  Resp Panel by RT-PCR (Flu A&B, Covid) Nasopharyngeal Swab     Status: None   Collection Time: 08/16/21  8:28 PM   Specimen: Nasopharyngeal Swab; Nasopharyngeal(NP) swabs in vial transport medium  Result Value Ref Range Status   SARS Coronavirus 2 by RT PCR NEGATIVE NEGATIVE Final  Comment: (NOTE) SARS-CoV-2 target nucleic acids are NOT DETECTED.  The SARS-CoV-2 RNA is generally detectable in upper respiratory specimens during the acute phase of infection. The lowest concentration of SARS-CoV-2 viral copies this assay can detect is 138 copies/mL. A negative result does not preclude SARS-Cov-2 infection and should not be used as the sole basis for treatment or other patient management decisions. A negative result may occur with  improper specimen collection/handling, submission of specimen other than nasopharyngeal swab, presence of viral mutation(s) within the areas targeted by this assay, and inadequate number of viral copies(<138 copies/mL). A negative result must be combined with clinical observations, patient history, and epidemiological information. The expected result is Negative.  Fact Sheet for Patients:  EntrepreneurPulse.com.au  Fact Sheet for Healthcare Providers:   IncredibleEmployment.be  This test is no t yet approved or cleared by the [place] FDA and  has been authorized for detection and/or diagnosis of SARS-CoV-2 by FDA under an Emergency Use Authorization (EUA). This EUA will remain  in effect (meaning this test can be used) for the duration of the COVID-19 declaration under Section 564(b)(1) of the Act, 21 U.S.C.section 360bbb-3(b)(1), unless the authorization is terminated  or revoked sooner.       Influenza A by PCR NEGATIVE NEGATIVE Final   Influenza B by PCR NEGATIVE NEGATIVE Final    Comment: (NOTE) The Xpert Xpress SARS-CoV-2/FLU/RSV plus assay is intended as an aid in the diagnosis of influenza from Nasopharyngeal swab specimens and should not be used as a sole basis for treatment. Nasal washings and aspirates are unacceptable for Xpert Xpress SARS-CoV-2/FLU/RSV testing.  Fact Sheet for Patients: EntrepreneurPulse.com.au  Fact Sheet for Healthcare Providers: IncredibleEmployment.be  This test is not yet approved or cleared by the [place] FDA and has been authorized for detection and/or diagnosis of SARS-CoV-2 by FDA under an Emergency Use Authorization (EUA). This EUA will remain in effect (meaning this test can be used) for the duration of the COVID-19 declaration under Section 564(b)(1) of the Act, 21 U.S.C. section 360bbb-3(b)(1), unless the authorization is terminated or revoked.  Performed at [facility], [address], [place], [place] [address]   Urine Culture     Status: None   Collection Time: 08/17/21 11:00 AM   Specimen: Urine, Clean Catch  Result Value Ref Range Status   Specimen Description   Final    URINE, CLEAN CATCH Performed at [facility], [address], [place], [place] [address]    Special Requests   Final    NONE Performed at [facility], [address] [place], [place] [address]    Culture   Final    NO GROWTH Performed  at [facility], [place] [address], [place], [place] [address]    Report Status 08/19/2021 FINAL  Final  Culture, blood (Routine X 2) w Reflex to ID Panel     Status: None   Collection Time: 08/19/21  7:28 AM   Specimen: BLOOD LEFT ARM  Result Value Ref Range Status   Specimen Description BLOOD LEFT ARM BOTTLES DRAWN AEROBIC AND ANAEROBIC  Final   Special Requests   Final    Blood Culture results may not be optimal due to an excessive volume of blood received in culture bottles   Culture   Final    NO GROWTH 5 DAYS Performed at [facility], [address], [place], [place] [address]    Report Status 08/24/2021 FINAL  Final  Culture, blood (Routine X 2) w Reflex to  ID Panel     Status: None   Collection Time: 08/19/21  8:27 AM   Specimen: BLOOD LEFT HAND  Result Value Ref Range Status   Specimen Description   Final    BLOOD LEFT HAND BOTTLES DRAWN AEROBIC AND ANAEROBIC   Special Requests Blood Culture adequate volume  Final   Culture   Final    NO GROWTH 5 DAYS Performed at [facility], [address] [place], [place] [address]    Report Status 08/24/2021 FINAL  Final  MRSA Next Gen by PCR, Nasal     Status: Abnormal   Collection Time: 08/21/21  5:40 PM   Specimen: Nasal Mucosa; Nasal Swab  Result Value Ref Range Status   MRSA by PCR Next Gen DETECTED (A) NOT DETECTED Final    Comment: RESULT CALLED TO, READ BACK BY AND VERIFIED WITH: JESSICA HEARN RN,2021,08/21/2021, SELF S. (NOTE) The GeneXpert MRSA Assay (FDA approved for NASAL specimens only), is one component of a comprehensive MRSA colonization surveillance program. It is not intended to diagnose MRSA infection nor to guide or monitor treatment for MRSA infections. Test performance is not FDA approved in patients less than 5 years old. Performed at [facility], [name], [place], [place] [address]   MRSA Next Gen by PCR, Nasal     Status: Abnormal   Collection Time: 08/26/21 12:49 AM   Specimen: Nasal  Mucosa; Nasal Swab  Result Value Ref Range Status   MRSA by PCR Next Gen DETECTED (A) NOT DETECTED Final    Comment: RESULT CALLED TO, READ BACK BY AND VERIFIED WITH: RN DAVID S.  [id number] FCP (NOTE) The GeneXpert MRSA Assay (FDA approved for NASAL specimens only), is one component of a comprehensive MRSA colonization surveillance program. It is not intended to diagnose MRSA infection nor to guide or monitor treatment for MRSA infections. Test performance is not FDA approved in patients less than 64 years old. Performed at [facility], [place] [address] [place], [place] [address]   Respiratory (~20 pathogens) panel by PCR     Status: None   Collection Time: 08/26/21  9:31 AM   Specimen: Nasopharyngeal Swab; Respiratory  Result Value Ref Range Status   Adenovirus NOT DETECTED NOT DETECTED Final   Coronavirus 229E NOT DETECTED NOT DETECTED Final    Comment: (NOTE) The Coronavirus on the Respiratory Panel, DOES NOT test for the novel  Coronavirus (2019 nCoV)    Coronavirus HKU1 NOT DETECTED NOT DETECTED Final   Coronavirus NL63 NOT DETECTED NOT DETECTED Final   Coronavirus OC43 NOT DETECTED NOT DETECTED Final   Metapneumovirus NOT DETECTED NOT DETECTED Final   Rhinovirus / Enterovirus NOT DETECTED NOT DETECTED Final   Influenza A NOT DETECTED NOT DETECTED Final   Influenza B NOT DETECTED NOT DETECTED Final   Parainfluenza Virus 1 NOT DETECTED NOT DETECTED Final   Parainfluenza Virus 2 NOT DETECTED NOT DETECTED Final   Parainfluenza Virus 3 NOT DETECTED NOT DETECTED Final   Parainfluenza Virus 4 NOT DETECTED NOT DETECTED Final   Respiratory Syncytial Virus NOT DETECTED NOT DETECTED Final   Bordetella pertussis NOT DETECTED NOT DETECTED Final   Bordetella Parapertussis NOT DETECTED NOT DETECTED Final   Chlamydophila pneumoniae NOT DETECTED NOT DETECTED Final   Mycoplasma pneumoniae NOT DETECTED NOT DETECTED Final    Comment: Performed at [facility], [place]. [address] [place] [address], [place], [place] [address]    Coagulation Studies: Recent Labs    08/26/21 1343  LABPROT  15.6*  INR 1.2    Urinalysis: Recent Labs    08/29/21 1030  COLORURINE YELLOW  LABSPEC 1.013  PHURINE 5.0  GLUCOSEU NEGATIVE  HGBUR SMALL*  BILIRUBINUR NEGATIVE  KETONESUR NEGATIVE  PROTEINUR 100*  NITRITE NEGATIVE  LEUKOCYTESUR NEGATIVE      Imaging: MR ANGIO HEAD WO CONTRAST  Result Date: 08/28/2021 CLINICAL DATA:  Neuro deficit, stroke seen on 08/27/2021 MRI head EXAM: MRA HEAD WITHOUT CONTRAST TECHNIQUE: Angiographic images of the Circle of Willis were acquired using MRA technique without intravenous contrast. COMPARISON:  08/16/2021 FINDINGS: Anterior circulation: Both internal carotid arteries are patent to the termini, without stenosis or other abnormality. Hypoplastic left A1. Better visualization of the distal left A1 and proximal left A2 than on the prior exam. Normal right A1. Normal anterior communicating artery. Anterior cerebral arteries are patent to their distal aspects. No M1 stenosis or occlusion. Normal MCA bifurcations. Distal MCA branches perfused and symmetric. Posterior circulation: Vertebral arteries widely patent to the vertebrobasilar junction without stenosis. Posterior inferior cerebral arteries patent bilaterally. Basilar patent to its distal aspect. Superior cerebral arteries patent bilaterally. Hypoplastic left P1 with near fetal origin of the left PCA, with a prominent left posterior communicating artery. The right posterior communicating artery is not visualized. Normal right P1. PCAs well perfused to their distal aspects without stenosis. Anatomic variants: None significant Other: None. IMPRESSION: 1. No intracranial large vessel occlusion. 2. Better visualization of the distal left A1 and proximal left A2 than on the prior exam, likely hypoplastic but without focal stenosis. Electronically Signed   By: Merilyn Baba M.D.   On: 08/28/2021 18:01   VAS US  CAROTID  Result Date: 08/28/2021 Carotid Arterial Duplex Study Patient Name:  EDLIN FORD  Date of Exam:   08/28/2021 Medical Rec #: [id number]            Accession #:    [id number] Date of Birth: [birth year]            Patient Gender: F Patient Age:   32 years Exam Location:  [facility] Procedure:      VAS US CAROTID Referring Phys: Alferd Patee [facility] --------------------------------------------------------------------------------  Indications:       CVA. Risk Factors:      Hypertension, hyperlipidemia, Diabetes, prior CVA. Limitations        Today's exam was limited due to a central line. Comparison Study:  no prior Performing Technologist: Archie Patten RVS  Examination Guidelines: A complete evaluation includes B-mode imaging, spectral Doppler, color Doppler, and power Doppler as needed of all accessible portions of each vessel. Bilateral testing is considered an integral part of a complete examination. Limited examinations for reoccurring indications may be performed as noted.  Right Carotid Findings: +----------+--------+--------+--------+------------------+---------------------+           PSV cm/sEDV cm/sStenosisPlaque DescriptionComments              +----------+--------+--------+--------+------------------+---------------------+ CCA Prox                                            not visualized due to                                                     central neck  line     +----------+--------+--------+--------+------------------+---------------------+ CCA Distal                                          not visualized due to                                                     central neck line     +----------+--------+--------+--------+------------------+---------------------+ ICA Prox                                            not visualized due to                                                     central neck line      +----------+--------+--------+--------+------------------+---------------------+ ICA Distal                                          not visualized due to                                                     central neck line     +----------+--------+--------+--------+------------------+---------------------+ ECA                                                 not visualized due to                                                     central neck line     +----------+--------+--------+--------+------------------+---------------------+ +---------+--------+--------+---------------------------------------+ VertebralPSV cm/sEDV cm/snot visualized due to central neck line +---------+--------+--------+---------------------------------------+  Left Carotid Findings: +----------+--------+--------+--------+------------------+---------------------+           PSV cm/sEDV cm/sStenosisPlaque DescriptionComments              +----------+--------+--------+--------+------------------+---------------------+ CCA Prox  102     15              homogeneous                             +----------+--------+--------+--------+------------------+---------------------+ CCA Distal132     18              heterogenous                            +----------+--------+--------+--------+------------------+---------------------+ ICA Prox  110     26  1-39%   heterogenous      Velocities may                                                            underestimate degree                                                      of stenosis due to                                                        more proximal                                                             obstruction.          +----------+--------+--------+--------+------------------+---------------------+ ICA Distal115     25                                                       +----------+--------+--------+--------+------------------+---------------------+ ECA       220                                                             +----------+--------+--------+--------+------------------+---------------------+ +----------+--------+--------+--------+-------------------+           PSV cm/sEDV cm/sDescribeArm Pressure (mmHG) +----------+--------+--------+--------+-------------------+ [id number]                                         +----------+--------+--------+--------+-------------------+ +---------+--------+--+--------+--+---------+ VertebralPSV cm/s56EDV cm/s10Antegrade +---------+--------+--+--------+--+---------+   Summary: Right Carotid: Not visualized due to central neck line. Left Carotid: Velocities in the left ICA are consistent with a 1-39% stenosis. Vertebrals: Left vertebral artery demonstrates antegrade flow. *See table(s) above for measurements and observations.  Electronically signed by Rosalin Hawking MD on 08/28/2021 at 1:41:22 PM.    Final    VAS [place] LOWER EXTREMITY VENOUS (DVT)  Result Date: 08/28/2021  Lower Venous DVT Study Patient Name:  HUBERT DERSTINE  Date of Exam:   08/28/2021 Medical Rec #: [id number]            Accession #:    [id number] Date of Birth: 02/01/1951            Patient Gender: F Patient Age:   70 years [facility] Procedure:      VAS [place] LOWER EXTREMITY VENOUS (  DVT) Referring Phys: Cornelius Moras XU --------------------------------------------------------------------------------  Indications: Stroke.  Comparison Study: no prior Performing Technologist: Archie Patten RVS  Examination Guidelines: A complete evaluation includes B-mode imaging, spectral Doppler, color Doppler, and power Doppler as needed of all accessible portions of each vessel. Bilateral testing is considered an integral part of a complete examination. Limited examinations for reoccurring indications may be performed as noted. The reflux  portion of the exam is performed with the patient in reverse Trendelenburg.  +---------+---------------+---------+-----------+----------+--------------+ RIGHT    CompressibilityPhasicitySpontaneityPropertiesThrombus Aging +---------+---------------+---------+-----------+----------+--------------+ CFV      Full           Yes      Yes                                 +---------+---------------+---------+-----------+----------+--------------+ SFJ      Full                                                        +---------+---------------+---------+-----------+----------+--------------+ FV Prox  Full                                                        +---------+---------------+---------+-----------+----------+--------------+ FV Mid   Full                                                        +---------+---------------+---------+-----------+----------+--------------+ FV DistalFull                                                        +---------+---------------+---------+-----------+----------+--------------+ PFV      Full                                                        +---------+---------------+---------+-----------+----------+--------------+ POP      Full           Yes      Yes                                 +---------+---------------+---------+-----------+----------+--------------+ PTV      Full                                                        +---------+---------------+---------+-----------+----------+--------------+ PERO     Full                                                        +---------+---------------+---------+-----------+----------+--------------+   +---------+---------------+---------+-----------+----------+--------------+  LEFT     CompressibilityPhasicitySpontaneityPropertiesThrombus Aging +---------+---------------+---------+-----------+----------+--------------+ CFV      Full           Yes      Yes                                  +---------+---------------+---------+-----------+----------+--------------+ SFJ      Full                                                        +---------+---------------+---------+-----------+----------+--------------+ FV Prox  Full                                                        +---------+---------------+---------+-----------+----------+--------------+ FV Mid   Full                                                        +---------+---------------+---------+-----------+----------+--------------+ FV DistalFull                                                        +---------+---------------+---------+-----------+----------+--------------+ PFV      Full                                                        +---------+---------------+---------+-----------+----------+--------------+ POP      Full           Yes      Yes                                 +---------+---------------+---------+-----------+----------+--------------+ PTV      Full                                                        +---------+---------------+---------+-----------+----------+--------------+ PERO     Full                                                        +---------+---------------+---------+-----------+----------+--------------+     Summary: BILATERAL: - No evidence of deep vein thrombosis seen in the lower extremities, bilaterally. -No evidence of popliteal cyst, bilaterally.   *See table(s) above for measurements and observations. Electronically signed by Orlie Pollen on 08/28/2021 at 2:28:27 PM.  Final      Medications:    sodium chloride     sodium chloride     sodium chloride 250 mL (08/26/21 1235)   cefTRIAXone (ROCEPHIN)  IV 2 g (08/29/21 6160)   thiamine injection 500 mg (08/28/21 1407)    allopurinol  100 mg Oral Daily   aspirin EC  81 mg Oral Daily   atorvastatin  80 mg Oral QHS   Chlorhexidine Gluconate Cloth  6 each  Topical Daily   diltiazem  60 mg Oral Q8H   hydrALAZINE  50 mg Oral Q8H   insulin aspart  0-9 Units Subcutaneous TID WC   insulin detemir  15 Units Subcutaneous QHS   labetalol  200 mg Oral BID   lipase/protease/amylase  36,000-72,000 Units Oral See admin instructions   pantoprazole  40 mg Oral Daily   sodium chloride flush  3 mL Intravenous Q12H   vancomycin variable dose per unstable renal function (pharmacist dosing)   Does not apply See admin instructions   sodium chloride, sodium chloride, sodium chloride, acetaminophen, diphenhydrAMINE, diphenoxylate-atropine, haloperidol lactate, hydrALAZINE, HYDROmorphone (DILAUDID) injection, HYDROmorphone, ipratropium-albuterol, LORazepam, meclizine, ondansetron **OR** ondansetron (ZOFRAN) IV, sodium chloride flush, temazepam  Assessment/ Plan:  AKI/CKD stage IIIb - in setting of PNA and hypertensive urgency and chronic diarrhea.  Baseline Scr variable 1.6-2.2 and follows Dr. Theador Hawthorne as an outpatient. Required HD 10/25 due to hyperkalemia with UF of 2 liters.   Serologies negative    Urine output was noted to increase but not well documented. Does not appear volume overloaded.  Tried gentle hydration on 10/31 without much effect Crt slightly higher again at 4.3 today; possible related to significant improvement in BP. Will continue to monitor and hopefully will see stabilization Maintain temp HD catheter for now but if continued improvement in coming days will d/c Acute hypoxic respiratory failure - due to multifocal pneumonia vs CHF.  Currently on Abx per primary.   last dialysis 10/25 with 2 L removed. Negative balance noted . V/Q negative   HTN urgency/emergency -Has diltiazem, labetalol and hydralazine po, IV prn metoprolol and hydralazine. BP more stable but on lower side. CTM and consider decreasing meds if BP stays below 737 systolic AMS  much improved today; multifactorial Hypomagnesemia - likely due to chronic diarrhea. Resolved Hypokalemia:  Likely multifactorial.  Improving with supplementation Anemia of critical illness - s/p transfusion.  Continue to follow H/H.  Thrombocytopenia -now normalized.  Possibly was related to hypertension    LOS: 12

## 2021-08-29 NOTE — Progress Notes (Signed)
PROGRESS NOTE    NYLAH BUTKUS  ZRA:[id number] DOB: Apr 08, 1951 DOA: 08/16/2021 PCP: [name], MD    Brief Narrative:  This 70 year old female with history of CKD stage IIIb, Crohn's disease and IBS with chronic diarrhea, hypertension, type 2 diabetes, BPPV who presented to the [facility] emergency room with blurry vision and diplopia for 2 days associated  with stabbing headache mostly on the back of her head.   In the ED Blood pressure systolic [phone].  Neurology was consulted.  MRI of the brain and MRA/MRV was done and was essentially normal.  She was also found to be suffering with acute kidney injury on CKDIIIb.  AKI failed to improve.  she has required hemodialysis initiation on 10/23 with IJ catheter placement. She continues to have some ongoing confusion.  Patient was transferred from [facility] to [facility] for further evaluation.  Blood pressure has improved.  She is back to her baseline mental status.  Nephrology continues to follow to determine need for long-term hemodialysis.   Assessment & Plan:   Principal Problem:   Blurry vision, bilateral Active Problems:   Essential hypertension   Type 2 diabetes with nephropathy (HCC)   Depression   Acute kidney injury superimposed on CKD (HCC)   Crohn's disease of small intestine with other complication (HCC)   Fusion of spine, cervicothoracic region   Hypomagnesemia   Acute hypokalemia   Acute respiratory failure with hypoxia (HCC)   Acute metabolic encephalopathy   Malignant hypertension   Thrombocytopenia (HCC)   Anemia due to chronic kidney disease   Pressure injury of skin   Cerebral thrombosis with cerebral infarction   Cerebrovascular accident (CVA) due to embolism of cerebral artery (HCC)  Acute hypoxic respiratory failure > multifactorial> Resolved. This could be due to multifocal pneumonia versus acute diastolic CHF exacerbation. V/Q scan did not show any evidence of PE. She was initially on room air on admission  but her oxygen requirement has gone up requiring 15 L of high flow oxygen but now weaned down to Room air.  Multifocal pneumonia: CT chest showed bilateral pulmonary infiltrate . Despite aggressive diuresis, infiltrates have persisted. Pulmonology consulted, since patient was taking Humira prior to admission, there is concern for atypical infection. MRSA PCR noted positive . Patient was started on cefepime and linezolid. Neurology recommended to change cefepime if possible.  Cefepime is known to cause altered mentation. Cefepime changed to ceftriaxone. Continue ceftriaxone and vancomycin. Pulm :No indication for bronchoscopy at this time.   Acute diastolic congestive heart failure: > Improved. Volume status was managed with hemodialysis as well as Lasix infusion. Her weight is down approximately 20 pounds since admission. Nephrology is following,  recommended to discontinue Lasix infusion.  Continue Lasix as needed. Appears Euvolemic.   Acute urinary retention: Continue Foley catheter.  Consider voiding trial.   Acute Encephalopathy:  > Resolved. Etiology is not entirely clear.  Uncontrolled severe hypertension could be contributing. Her husband does describe some periods of confusion at home, but these appear to be transient.   Initial MRI unremarkable,  TSH, ammonia, ABG were noncontributory. ESR markedly elevated at 127. Connective tissue disease work-up has thus far been unremarkable. She might be having any encephalitis Neurology consulted.  Recommended MRI brain, EEG, change cefepime if possible Thiamine 500 mg IV daily, consider lumbar puncture.  Repeat MRI : small acute infarcts in the left cerebellum and left frontal operculum since study 11 days ago. Patient seems much improved.  She is alert,  oriented and  following commands.  AKI on CKD stage IIIb: Patient had temporary dialysis catheter placed in right IJ. She underwent hemodialysis on 10/23 and 10/25. Renal  ultrasound with No significant findings. Lasix infusion discontinued and continue lasix PRN. Further management as per nephrology.   Hypertensive urgency:  > Improved. Patient presented with headache and ocular symptoms associated with uncontrolled hypertension on admission. Patient had difficult to control BP readings for first few days Continue diltiazem, labetalol and hydralazine po and  as needed hydralazine  Likely need further titration of her antihypertensive medications. Blood pressure has much improved.   Acute on chronic anemia > stable. Could be related to CKD. S/p 2 unit PRBC transfusion on 10/23. Monitor for overt bleeding. Hb remains stable.    Thrombocytopenia:  > Improved. HIT panel negative.   Acute diplopia and headaches: > Improved. Unclear etiology and assessed by neurology Likely was related to severe hypomagnesemia which had been corrected It was felt that her occipital headache may also be referred from her chronic cervical spine disease.   Non-anion gap metabolic acidosis It could be related to diarrhea and possibly AKI. Overall improved.   Type 2 diabetes:  Continue SSI and Levemir  Crohn's disease with chronic diarrhea Resume Humira outpatient. No diarrhea at present    DVT prophylaxis: Heparin Code Status: DNR Family Communication: No family at bed side. Disposition Plan:   Status is: Inpatient  Remains inpatient appropriate because:  Continued encephalopathy, uncontrolled blood pressure and persistent pneumonia requiring further work-up  Anticipated discharge to SNF or home health services in few days   Consultants:  Nephrology Neurology Pulmonology PCCM  Procedures: Right IJ hemodialysis catheter 10/23 Antimicrobials:   Anti-infectives (From admission, onward)    Start     Dose/Rate Route Frequency Ordered Stop   08/28/21 1419  vancomycin variable dose per unstable renal function (pharmacist dosing)         Does not apply  See admin instructions 08/28/21 1419     08/26/21 1515  vancomycin (VANCOREADY) IVPB 1250 mg/250 mL        1,250 mg 166.7 mL/hr over 90 Minutes Intravenous  Once 08/26/21 1427 08/26/21 1913   08/26/21 1200  ceFEPIme (MAXIPIME) 2 g in sodium chloride 0.9 % 100 mL IVPB  Status:  Discontinued        2 g 200 mL/hr over 30 Minutes Intravenous Every 24 hours 08/25/21 1710 08/26/21 0811   08/26/21 0900  cefTRIAXone (ROCEPHIN) 2 g in sodium chloride 0.9 % 100 mL IVPB        2 g 200 mL/hr over 30 Minutes Intravenous Every 24 hours 08/26/21 0811     08/23/21 1000  linezolid (ZYVOX) tablet 600 mg  Status:  Discontinued        600 mg Oral Every 12 hours 08/22/21 1038 08/26/21 0959   08/23/21 0900  ceFEPIme (MAXIPIME) 2 g in sodium chloride 0.9 % 100 mL IVPB        2 g 200 mL/hr over 30 Minutes Intravenous Every 24 hours 08/23/21 0736 08/25/21 1900   08/19/21 0745  ceFEPIme (MAXIPIME) 2 g in sodium chloride 0.9 % 100 mL IVPB  Status:  Discontinued        2 g 200 mL/hr over 30 Minutes Intravenous Every 24 hours 08/19/21 0651 08/23/21 0736   08/19/21 0745  vancomycin (VANCOREADY) IVPB 750 mg/150 mL  Status:  Discontinued        750 mg 150 mL/hr over 60 Minutes Intravenous Every 48 hours 08/19/21 0651 08/22/21  1038   08/18/21 0945  fluconazole (DIFLUCAN) tablet 150 mg        150 mg Oral  Once 08/18/21 6073 08/18/21 0951        Subjective: Patient was seen and examined at bedside.  Overnight events noted.   Her blood pressure has finally improved. She is alert,  oriented and following commands.   She seems much improved.  She is back to her baseline mental status, She was sitting on the bed eating breakfast.  Objective: Vitals:   08/28/21 2319 08/29/21 0341 08/29/21 0740 08/29/21 1158  BP: (!) 115/48 (!) 120/45 (!) 126/47 (!) 127/47  Pulse: 70 70 72 70  Resp: _0 Temp: 98.1 F (36.7 C) 97.9 F (36.6 C) 98.1 F (36.7 C) 98.2 F (36.8 C)  TempSrc: Oral Oral Oral Oral  SpO2: 99% 98%  99% 100%  Weight:  68.6 kg    Height:        Intake/Output Summary (Last 24 hours) at 08/29/2021 1210 Last data filed at 08/29/2021 1105 Gross per 24 hour  Intake 1857.32 ml  Output 400 ml  Net 1457.32 ml   Filed Weights   08/27/21 0520 08/28/21 0604 08/29/21 0341  Weight: 67.3 kg 65.6 kg 68.6 kg    Examination:  General exam: Appears comfortable,chronically ill looking,  following commands.   Respiratory system: Clear to auscultation. Respiratory effort normal. RR 14 Cardiovascular system: S1-S2 heard, regular rate and rhythm, no murmur.   Gastrointestinal system: Abdomen is soft, nontender, nondistended, BS + Central nervous system: Alert and oriented x 3, following  commands, No focal neurological deficits. Extremities: No edema, no cyanosis, no clubbing. Skin: No rashes, lesions or ulcers Psychiatry: Judgement and insight appear normal. Mood & affect appropriate.     Data Reviewed: I have personally reviewed following labs and imaging studies  CBC: Recent Labs  Lab 08/26/21 0103 08/26/21 0435 08/27/21 0400 08/28/21 0633 08/29/21 0350  WBC 7.5 7.5 8.3 9.2 9.6  HGB 9.6* 9.7* 9.2* 9.3* 7.9*  HCT 30.2* 31.0* 29.4* 29.3* 24.9*  MCV 93.8 94.5 94.2 93.9 93.6  PLT 150 163 178 206 710   Basic Metabolic Panel: Recent Labs  Lab 08/23/21 0443 08/24/21 0430 08/25/21 0407 08/26/21 0103 08/26/21 0435 08/27/21 0400 08/28/21 0633 08/29/21 0350  NA 140   < > 143 144  --  144 141 132*  K 3.7   < > 3.8 3.2*  --  2.8* 3.0* 3.4*  CL 102   < > 104 101  --  101 100 98  CO2 27   < > 29 29  --  _1 GLUCOSE 130*   < > 146* 203*  --  220* 151* 264*  BUN 40*   < > 72* 73*  --  76* 79* 79*  CREATININE 2.81*   < > 3.65* 3.54*  --  3.37* 3.88* 4.32*  CALCIUM 8.3*   < > 8.8* 9.6  --  9.3 9.1 8.4*  MG 2.0  --   --   --  2.0 1.6* 2.4  --   PHOS 3.5   < > 4.8* 3.3  --  3.4  3.4 4.1 4.1   < > = values in this interval not displayed.   GFR: Estimated Creatinine Clearance:  11.7 mL/min (A) (by C-G formula based on SCr of 4.32 mg/dL (H)). Liver Function Tests: Recent Labs  Lab 08/25/21 0407 08/26/21 0103 08/27/21 0400 08/28/21 0633 08/29/21 0350  ALBUMIN 2.4* 2.4*  2.3* 2.3* 2.2*   No results for input(s): LIPASE, AMYLASE in the last 168 hours. Recent Labs  Lab 08/24/21 1718  AMMONIA 25   Coagulation Profile: Recent Labs  Lab 08/23/21 1223 08/26/21 1343  INR 1.3* 1.2   Cardiac Enzymes: Recent Labs  Lab 08/27/21 0400  CKTOTAL 71   BNP (last 3 results) No results for input(s): PROBNP in the last 8760 hours. HbA1C: Recent Labs    08/27/21 1558  HGBA1C 6.1*   CBG: Recent Labs  Lab 08/28/21 1217 08/28/21 1801 08/28/21 2101 08/29/21 0605 08/29/21 1156  GLUCAP 83 148* 425* 215* 236*   Lipid Profile: Recent Labs    08/27/21 1557  CHOL 107  HDL 32*  LDLCALC 40  TRIG 175*  CHOLHDL 3.3   Thyroid Function Tests: No results for input(s): TSH, T4TOTAL, FREET4, T3FREE, THYROIDAB in the last 72 hours. Anemia Panel: Recent Labs    08/27/21 1529  VITAMINB12 1,417*   Sepsis Labs: No results for input(s): PROCALCITON, LATICACIDVEN in the last 168 hours.  Recent Results (from the past 240 hour(s))  MRSA Next Gen by PCR, Nasal     Status: Abnormal   Collection Time: 08/21/21  5:40 PM   Specimen: Nasal Mucosa; Nasal Swab  Result Value Ref Range Status   MRSA by PCR Next Gen DETECTED (A) NOT DETECTED Final    Comment: RESULT CALLED TO, READ BACK BY AND VERIFIED WITH: JESSICA HEARN RN,2021,08/21/2021, SELF S. (NOTE) The GeneXpert MRSA Assay (FDA approved for NASAL specimens only), is one component of a comprehensive MRSA colonization surveillance program. It is not intended to diagnose MRSA infection nor to guide or monitor treatment for MRSA infections. Test performance is not FDA approved in patients less than 81 years old. Performed at [facility], [address] [place], [place] [address]   MRSA Next Gen by PCR, Nasal      Status: Abnormal   Collection Time: 08/26/21 12:49 AM   Specimen: Nasal Mucosa; Nasal Swab  Result Value Ref Range Status   MRSA by PCR Next Gen DETECTED (A) NOT DETECTED Final    Comment: RESULT CALLED TO, READ BACK BY AND VERIFIED WITH: RN DAVID S.  [id number] FCP (NOTE) The GeneXpert MRSA Assay (FDA approved for NASAL specimens only), is one component of a comprehensive MRSA colonization surveillance program. It is not intended to diagnose MRSA infection nor to guide or monitor treatment for MRSA infections. Test performance is not FDA approved in patients less than 42 years old. Performed at [facility], [place] [address] [place], [place] [address]   Respiratory (~20 pathogens) panel by PCR     Status: None   Collection Time: 08/26/21  9:31 AM   Specimen: Nasopharyngeal Swab; Respiratory  Result Value Ref Range Status   Adenovirus NOT DETECTED NOT DETECTED Final   Coronavirus 229E NOT DETECTED NOT DETECTED Final    Comment: (NOTE) The Coronavirus on the Respiratory Panel, DOES NOT test for the novel  Coronavirus (2019 nCoV)    Coronavirus HKU1 NOT DETECTED NOT DETECTED Final   Coronavirus NL63 NOT DETECTED NOT DETECTED Final   Coronavirus OC43 NOT DETECTED NOT DETECTED Final   Metapneumovirus NOT DETECTED NOT DETECTED Final   Rhinovirus / Enterovirus NOT DETECTED NOT DETECTED Final   Influenza A NOT DETECTED NOT DETECTED Final   Influenza B NOT DETECTED NOT DETECTED Final   Parainfluenza Virus 1 NOT DETECTED NOT DETECTED Final   Parainfluenza Virus 2 NOT DETECTED NOT DETECTED Final  Parainfluenza Virus 3 NOT DETECTED NOT DETECTED Final   Parainfluenza Virus 4 NOT DETECTED NOT DETECTED Final   Respiratory Syncytial Virus NOT DETECTED NOT DETECTED Final   Bordetella pertussis NOT DETECTED NOT DETECTED Final   Bordetella Parapertussis NOT DETECTED NOT DETECTED Final   Chlamydophila pneumoniae NOT DETECTED NOT DETECTED Final   Mycoplasma pneumoniae NOT DETECTED NOT  DETECTED Final    Comment: Performed at [facility], [place] [address], [place], [place] [address]         Radiology Studies: MR ANGIO HEAD WO CONTRAST  Result Date: 08/28/2021 CLINICAL DATA:  Neuro deficit, stroke seen on 08/27/2021 MRI head EXAM: MRA HEAD WITHOUT CONTRAST TECHNIQUE: Angiographic images of the Circle of Willis were acquired using MRA technique without intravenous contrast. COMPARISON:  08/16/2021 FINDINGS: Anterior circulation: Both internal carotid arteries are patent to the termini, without stenosis or other abnormality. Hypoplastic left A1. Better visualization of the distal left A1 and proximal left A2 than on the prior exam. Normal right A1. Normal anterior communicating artery. Anterior cerebral arteries are patent to their distal aspects. No M1 stenosis or occlusion. Normal MCA bifurcations. Distal MCA branches perfused and symmetric. Posterior circulation: Vertebral arteries widely patent to the vertebrobasilar junction without stenosis. Posterior inferior cerebral arteries patent bilaterally. Basilar patent to its distal aspect. Superior cerebral arteries patent bilaterally. Hypoplastic left P1 with near fetal origin of the left PCA, with a prominent left posterior communicating artery. The right posterior communicating artery is not visualized. Normal right P1. PCAs well perfused to their distal aspects without stenosis. Anatomic variants: None significant Other: None. IMPRESSION: 1. No intracranial large vessel occlusion. 2. Better visualization of the distal left A1 and proximal left A2 than on the prior exam, likely hypoplastic but without focal stenosis. Electronically Signed   By: Merilyn Baba M.D.   On: 08/28/2021 18:01   VAS US CAROTID  Result Date: 08/28/2021 Carotid Arterial Duplex Study Patient Name:  SYRENA BURGES  Date of Exam:   08/28/2021 Medical Rec #: [id number]            Accession #:    [id number] Date of Birth: 08-07-1951            Patient  Gender: F Patient Age:   70 years Exam Location:  [facility] Procedure:      VAS US CAROTID Referring Phys: Alferd Patee [facility] --------------------------------------------------------------------------------  Indications:       CVA. Risk Factors:      Hypertension, hyperlipidemia, Diabetes, prior CVA. Limitations        Today's exam was limited due to a central line. Comparison Study:  no prior Performing Technologist: Archie Patten RVS  Examination Guidelines: A complete evaluation includes B-mode imaging, spectral Doppler, color Doppler, and power Doppler as needed of all accessible portions of each vessel. Bilateral testing is considered an integral part of a complete examination. Limited examinations for reoccurring indications may be performed as noted.  Right Carotid Findings: +----------+--------+--------+--------+------------------+---------------------+           PSV cm/sEDV cm/sStenosisPlaque DescriptionComments              +----------+--------+--------+--------+------------------+---------------------+ CCA Prox                                            not visualized due to  central neck line     +----------+--------+--------+--------+------------------+---------------------+ CCA Distal                                          not visualized due to                                                     central neck line     +----------+--------+--------+--------+------------------+---------------------+ ICA Prox                                            not visualized due to                                                     central neck line     +----------+--------+--------+--------+------------------+---------------------+ ICA Distal                                          not visualized due to                                                     central neck line      +----------+--------+--------+--------+------------------+---------------------+ ECA                                                 not visualized due to                                                     central neck line     +----------+--------+--------+--------+------------------+---------------------+ +---------+--------+--------+---------------------------------------+ VertebralPSV cm/sEDV cm/snot visualized due to central neck line +---------+--------+--------+---------------------------------------+  Left Carotid Findings: +----------+--------+--------+--------+------------------+---------------------+           PSV cm/sEDV cm/sStenosisPlaque DescriptionComments              +----------+--------+--------+--------+------------------+---------------------+ CCA Prox  102     15              homogeneous                             +----------+--------+--------+--------+------------------+---------------------+ CCA Distal132     18              heterogenous                            +----------+--------+--------+--------+------------------+---------------------+ ICA Prox  110     26  1-39%   heterogenous      Velocities may                                                            underestimate degree                                                      of stenosis due to                                                        more proximal                                                             obstruction.          +----------+--------+--------+--------+------------------+---------------------+ ICA Distal115     25                                                      +----------+--------+--------+--------+------------------+---------------------+ ECA       220                                                             +----------+--------+--------+--------+------------------+---------------------+  +----------+--------+--------+--------+-------------------+           PSV cm/sEDV cm/sDescribeArm Pressure (mmHG) +----------+--------+--------+--------+-------------------+ [id number]                                         +----------+--------+--------+--------+-------------------+ +---------+--------+--+--------+--+---------+ VertebralPSV cm/s56EDV cm/s10Antegrade +---------+--------+--+--------+--+---------+   Summary: Right Carotid: Not visualized due to central neck line. Left Carotid: Velocities in the left ICA are consistent with a 1-39% stenosis. Vertebrals: Left vertebral artery demonstrates antegrade flow. *See table(s) above for measurements and observations.  Electronically signed by Rosalin Hawking MD on 08/28/2021 at 1:41:22 PM.    Final    VAS [place] LOWER EXTREMITY VENOUS (DVT)  Result Date: 08/28/2021  Lower Venous DVT Study Patient Name:  KEANDRA MEDERO  Date of Exam:   08/28/2021 Medical Rec #: [id number]            Accession #:    [id number] Date of Birth: Nov 30, 1950            Patient Gender: F Patient Age:   70 years Exam Location:  [facility] Procedure:      VAS [place] LOWER EXTREMITY VENOUS (  DVT) Referring Phys: Cornelius Moras XU --------------------------------------------------------------------------------  Indications: Stroke.  Comparison Study: no prior Performing Technologist: Archie Patten RVS  Examination Guidelines: A complete evaluation includes B-mode imaging, spectral Doppler, color Doppler, and power Doppler as needed of all accessible portions of each vessel. Bilateral testing is considered an integral part of a complete examination. Limited examinations for reoccurring indications may be performed as noted. The reflux portion of the exam is performed with the patient in reverse Trendelenburg.  +---------+---------------+---------+-----------+----------+--------------+ RIGHT    CompressibilityPhasicitySpontaneityPropertiesThrombus Aging  +---------+---------------+---------+-----------+----------+--------------+ CFV      Full           Yes      Yes                                 +---------+---------------+---------+-----------+----------+--------------+ SFJ      Full                                                        +---------+---------------+---------+-----------+----------+--------------+ FV Prox  Full                                                        +---------+---------------+---------+-----------+----------+--------------+ FV Mid   Full                                                        +---------+---------------+---------+-----------+----------+--------------+ FV DistalFull                                                        +---------+---------------+---------+-----------+----------+--------------+ PFV      Full                                                        +---------+---------------+---------+-----------+----------+--------------+ POP      Full           Yes      Yes                                 +---------+---------------+---------+-----------+----------+--------------+ PTV      Full                                                        +---------+---------------+---------+-----------+----------+--------------+ PERO     Full                                                        +---------+---------------+---------+-----------+----------+--------------+   +---------+---------------+---------+-----------+----------+--------------+  LEFT     CompressibilityPhasicitySpontaneityPropertiesThrombus Aging +---------+---------------+---------+-----------+----------+--------------+ CFV      Full           Yes      Yes                                 +---------+---------------+---------+-----------+----------+--------------+ SFJ      Full                                                         +---------+---------------+---------+-----------+----------+--------------+ FV Prox  Full                                                        +---------+---------------+---------+-----------+----------+--------------+ FV Mid   Full                                                        +---------+---------------+---------+-----------+----------+--------------+ FV DistalFull                                                        +---------+---------------+---------+-----------+----------+--------------+ PFV      Full                                                        +---------+---------------+---------+-----------+----------+--------------+ POP      Full           Yes      Yes                                 +---------+---------------+---------+-----------+----------+--------------+ PTV      Full                                                        +---------+---------------+---------+-----------+----------+--------------+ PERO     Full                                                        +---------+---------------+---------+-----------+----------+--------------+     Summary: BILATERAL: - No evidence of deep vein thrombosis seen in the lower extremities, bilaterally. -No evidence of popliteal cyst, bilaterally.   *See table(s) above for measurements and observations. Electronically signed by Orlie Pollen on 08/28/2021 at 2:28:27 PM.  Final     Scheduled Meds:  allopurinol  100 mg Oral Daily   aspirin EC  81 mg Oral Daily   atorvastatin  80 mg Oral QHS   Chlorhexidine Gluconate Cloth  6 each Topical Daily   diltiazem  60 mg Oral Q8H   hydrALAZINE  50 mg Oral Q8H   insulin aspart  0-9 Units Subcutaneous TID WC   insulin detemir  15 Units Subcutaneous QHS   labetalol  200 mg Oral BID   lipase/protease/amylase  36,000-72,000 Units Oral See admin instructions   pantoprazole  40 mg Oral Daily   sodium chloride flush  3 mL Intravenous Q12H    vancomycin variable dose per unstable renal function (pharmacist dosing)   Does not apply See admin instructions   Continuous Infusions:  sodium chloride     sodium chloride     sodium chloride 250 mL (08/26/21 1235)   cefTRIAXone (ROCEPHIN)  IV 2 g (08/29/21 0852)   thiamine injection 500 mg (08/28/21 1407)     LOS: 12 days    Time spent: 25 mins    Baltasar Twilley, MD [facility]   If 7PM-7AM, please contact night-coverage

## 2021-08-29 NOTE — Plan of Care (Signed)
  Problem: Clinical Measurements: Goal: Respiratory complications will improve Outcome: Progressing Goal: Cardiovascular complication will be avoided Outcome: Progressing   Problem: Health Behavior/Discharge Planning: Goal: Ability to manage health-related needs will improve Outcome: Not Progressing   

## 2021-08-29 NOTE — Progress Notes (Signed)
Patient inquiring about her humira injection. Advised patient to address with MD.

## 2021-08-29 NOTE — Progress Notes (Signed)
Inpatient Diabetes Program Recommendations  AACE/ADA: New Consensus Statement on Inpatient Glycemic Control  Target Ranges:  Prepandial:   less than 140 mg/dL      Peak postprandial:   less than 180 mg/dL (1-2 hours)      Critically ill patients:  140 - 180 mg/dL   Results for [name], Heather A "[name]" (MRN [id number]) as of 08/29/2021 10:19  Ref. Range 08/28/2021 12:17 08/28/2021 18:01 08/28/2021 21:01 08/29/2021 06:05  Glucose-Capillary Latest Ref Range: 70 - 99 mg/dL 83 148 (H) 425 (H) 215 (H)   Review of Glycemic Control  Diabetes history: DM2 Outpatient Diabetes medications: Levemir 70 units QHS, Humalog 20-26 units TID with meals Current orders for Inpatient glycemic control: Levemir 15 units QHS, Novolog 0-9 units TID with meals   Inpatient Diabetes Program Recommendations:     Insulin: Please consider ordering Novolog 0-5 units QHS for bedtime correction.   Thanks, Barnie Alderman, RN, MSN, CDE Diabetes Coordinator Inpatient Diabetes Program [phone] (Team Pager from 8am to 5pm)

## 2021-08-30 ENCOUNTER — Encounter (HOSPITAL_COMMUNITY)
Admission: RE | Admit: 2021-08-30 | Discharge: 2021-08-30 | Disposition: A | Discharge status: Home / Self Care | Payer: 59 | Source: Ambulatory Visit | Attending: Nephrology | Admitting: Nephrology

## 2021-08-30 DIAGNOSIS — G9341 Metabolic encephalopathy: Secondary | ICD-10-CM

## 2021-08-30 DIAGNOSIS — I6340 Cerebral infarction due to embolism of unspecified cerebral artery: Secondary | ICD-10-CM

## 2021-08-30 DIAGNOSIS — I6330 Cerebral infarction due to thrombosis of unspecified cerebral artery: Secondary | ICD-10-CM

## 2021-08-30 LAB — CBC
HCT: 24.2 % — ABNORMAL LOW (ref 36.0–46.0)
Hemoglobin: 7.8 g/dL — ABNORMAL LOW (ref 12.0–15.0)
MCH: 30.4 pg (ref 26.0–34.0)
MCHC: 32.2 g/dL (ref 30.0–36.0)
MCV: 94.2 fL (ref 80.0–100.0)
Platelets: 199 10*3/uL (ref 150–400)
RBC: 2.57 MIL/uL — ABNORMAL LOW (ref 3.87–5.11)
RDW: 15 % (ref 11.5–15.5)
WBC: 9.2 10*3/uL (ref 4.0–10.5)
nRBC: 0 % (ref 0.0–0.2)

## 2021-08-30 LAB — RENAL FUNCTION PANEL
Albumin: 2.4 g/dL — ABNORMAL LOW (ref 3.5–5.0)
Anion gap: 9 (ref 5–15)
BUN: 78 mg/dL — ABNORMAL HIGH (ref 8–23)
CO2: 24 mmol/L (ref 22–32)
Calcium: 8.6 mg/dL — ABNORMAL LOW (ref 8.9–10.3)
Chloride: 97 mmol/L — ABNORMAL LOW (ref 98–111)
Creatinine, Ser: 4.6 mg/dL — ABNORMAL HIGH (ref 0.44–1.00)
GFR, Estimated: 10 mL/min — ABNORMAL LOW (ref >60)
Glucose, Bld: 257 mg/dL — ABNORMAL HIGH (ref 70–99)
Phosphorus: 5.3 mg/dL — ABNORMAL HIGH (ref 2.5–4.6)
Potassium: 3.9 mmol/L (ref 3.5–5.1)
Sodium: 130 mmol/L — ABNORMAL LOW (ref 135–145)

## 2021-08-30 LAB — GLUCOSE, CAPILLARY
Glucose-Capillary: 227 mg/dL — ABNORMAL HIGH (ref 70–99)
Glucose-Capillary: 237 mg/dL — ABNORMAL HIGH (ref 70–99)
Glucose-Capillary: 173 mg/dL — ABNORMAL HIGH (ref 70–99)
Glucose-Capillary: 230 mg/dL — ABNORMAL HIGH (ref 70–99)

## 2021-08-30 LAB — VITAMIN B1: Vitamin B1 (Thiamine): 84.2 nmol/L (ref 66.5–200.0)

## 2021-08-30 LAB — VANCOMYCIN, RANDOM: Vancomycin Rm: 16

## 2021-08-30 MED ORDER — LORAZEPAM 1 MG PO TABS
1.0000 mg | ORAL_TABLET | Freq: Four times a day (QID) | ORAL | Status: DC | PRN
  Administered 2021-08-30 – 2021-09-05 (×2): 1 mg via ORAL
  Filled 2021-08-30 (×2): qty 1

## 2021-08-30 MED ORDER — HALOPERIDOL LACTATE 5 MG/ML IJ SOLN: 1.0000 mg | Freq: Four times a day (QID) | INTRAMUSCULAR | Status: DC | PRN

## 2021-08-30 MED ORDER — CYCLOBENZAPRINE HCL 10 MG PO TABS
5.0000 mg | ORAL_TABLET | Freq: Three times a day (TID) | ORAL | Status: DC | PRN
  Administered 2021-08-30 – 2021-09-05 (×8): 5 mg via ORAL
  Filled 2021-08-30 (×8): qty 1

## 2021-08-30 NOTE — Evaluation (Signed)
Occupational Therapy Evaluation Patient Details Name: Heather [name] MRN: [id number] DOB: 12/11/1950 Today's Date: 08/30/2021   History of Present Illness Pt is a 70 y.o. female who presented 08/16/21 with blurred vision, HTN, and headache. Found to have altered mental status with AKI on CKD, needed dialysis on 10/23. EEG mild diffuse slowing, MRI on 10/19 no acute stroke, MRA left ACA stenosis, MRV negative. However on 10/30 MRI showed left cerebellum and left frontal small/punctate infarcts. S/p temporary dialysis central line placement R internal jugular 10/23. PMH: CKD 3, Crohn's disease, hypertension, diabetes mellitus, BPPV stroke   Clinical Impression   Patient admitted for the diagnosis above.  PTA she lives with her spouse, who underwent a TKR about 4 weeks prior and is continuing to recover.  She has been doing all the driving, home management, and meal prep.  She does not use an AD for mobility, but does endorse falls, and needs on assist with her own self care.  Deficits impacting independence are listed below.  Currently she is needing up to [facility] for in room mobility/toileting and lower body ADL from a sit/stand level.  She subjectively states her vision is improving, but continues to have blurred vision.  OT will follow in the acute setting, but home with intermittent assist, and HH OT is recommended currently.       Recommendations for follow up therapy are one component of a multi-disciplinary discharge planning process, led by the attending physician.  Recommendations may be updated based on patient status, additional functional criteria and insurance authorization.   Follow Up Recommendations  Home health OT    Assistance Recommended at Discharge Intermittent Supervision/Assistance  Functional Status Assessment  Patient has had a recent decline in their functional status and demonstrates the ability to make significant improvements in function in a reasonable and  predictable amount of time.  Equipment Recommendations       Recommendations for Other Services       Precautions / Restrictions Precautions Precautions: Fall;Other (comment) Precaution Comments: monitor BP Restrictions Weight Bearing Restrictions: No      Mobility Bed Mobility Overal bed mobility: Needs Assistance Bed Mobility: Supine to Sit;Sit to Supine     Supine to sit: Supervision Sit to supine: Supervision   General bed mobility comments: Extra time time, supervision for safety with HOB elevated. Patient Response: Restless  Transfers Overall transfer level: Needs assistance Equipment used: Rolling walker (2 wheels) Transfers: Sit to/from [facility] Sit to Stand: Min guard Stand pivot transfers: Min guard         General transfer comment: Patient walked HHA back from the restroom.      Balance Overall balance assessment: Needs assistance Sitting-balance support: No upper extremity supported;Feet supported Sitting balance-Leahy Scale: Good     Standing balance support: Reliant on assistive device for balance;Bilateral upper extremity supported Standing balance-Leahy Scale: Poor Standing balance comment: RW for balance and safety                           ADL either performed or assessed with clinical judgement   ADL       Grooming: Wash/dry hands;Wash/dry face;Min guard;Standing       Lower Body Bathing: Min guard;Sitting/lateral leans       Lower Body Dressing: Min guard;Sitting/lateral leans   Toilet Transfer: Min Dentist;Ambulation   Toileting- Clothing Manipulation and Hygiene: Supervision/safety;Sitting/lateral lean  Vision Baseline Vision/History: 1 Wears glasses Patient Visual Report: Blurring of vision Additional Comments: Patient reports L eye worse than R, but vision is improving.     Perception Perception Perception: Within Functional Limits    Praxis Praxis Praxis: Intact    Pertinent Vitals/Pain Pain Assessment: Faces  Faces Pain Scale: Hurts a little bit Pain Location: mid back Pain Descriptors / Indicators: Aching Pain Intervention(s): RN gave pain meds during session     Hand Dominance Right   Extremity/Trunk Assessment Upper Extremity Assessment Upper Extremity Assessment: Overall WFL for tasks assessed   Lower Extremity Assessment Lower Extremity Assessment: Defer to PT evaluation   Cervical / Trunk Assessment Cervical / Trunk Assessment: Normal   Communication Communication Communication: No difficulties   Cognition Arousal/Alertness: Awake/alert Behavior During Therapy: WFL for tasks assessed/performed Overall Cognitive Status: Impaired/Different from baseline Area of Impairment: Memory;Safety/judgement;Awareness;Problem solving                     Memory: Decreased short-term memory   Safety/Judgement: Decreased awareness of safety;Decreased awareness of deficits Awareness: Emergent Problem Solving: Slow processing;Difficulty sequencing;Requires verbal cues General Comments: Pt reports not remembering anything during hospitalization until yesterday. Pt hallucinating multiple people in room throughout session, RN made aware. Poor awareness into her symptoms/deficits to maintain her safety, needing repeated cuing to notify PT of her sensations.     General Comments      Exercises     Shoulder Instructions      Home Living Family/patient expects to be discharged to:: Private residence Living Arrangements: Spouse/significant other Available Help at Discharge: Family;Available 24 hours/day Type of Home: House Home Access: Ramped entrance     Home Layout: One level     Bathroom Shower/Tub: Teacher, early years/pre: Handicapped height     Home Equipment: Rollator (4 wheels);Cane - single point;Crutches;BSC;Tub bench;Grab bars - tub/shower;Wheelchair - Presenter, broadcasting (2 wheels);Cane - quad          Prior Functioning/Environment Prior Level of Function : Independent/Modified Independent             Mobility Comments: Does not use AD ADLs Comments: No assist for ADL/IADL.  Spouse recently had TKA and is recovering.        OT Problem List: Decreased strength;Decreased activity tolerance;Impaired balance (sitting and/or standing);Decreased knowledge of use of DME or AE;Decreased safety awareness;Decreased cognition      OT Treatment/Interventions: Self-care/ADL training;Therapeutic activities;Patient/family education;Balance training;DME and/or AE instruction    OT Goals(Current goals can be found in the care plan section) Acute Rehab OT Goals Patient Stated Goal: Get back home when they let me OT Goal Formulation: With patient Time For Goal Achievement: 09/13/21 Potential to Achieve Goals: Good ADL Goals Pt Will Perform Grooming: with modified independence;standing Pt Will Perform Lower Body Bathing: with modified independence;sit to/from stand Pt Will Perform Lower Body Dressing: with modified independence;sit to/from stand Pt Will Transfer to Toilet: with modified independence;ambulating;regular height toilet Pt Will Perform Toileting - Clothing Manipulation and hygiene: Independently;sit to/from stand  OT Frequency: Min 2X/week   Barriers to D/C:    None noted       Co-evaluation              AM-PAC OT "6 Clicks" Daily Activity     Outcome Measure Help from another person eating meals?: None Help from another person taking care of personal grooming?: A Little Help from another person toileting, which includes using toliet, bedpan, or urinal?: A Little Help  from another person bathing (including washing, rinsing, drying)?: A Little Help from another person to put on and taking off regular upper body clothing?: None Help from another person to put on and taking off regular lower body clothing?: A Little 6 Click Score:  20   End of Session Equipment Utilized During Treatment: Gait belt;Rolling walker (2 wheels) Nurse Communication: Mobility status  Activity Tolerance: Patient tolerated treatment well Patient left: in bed;with call bell/phone within reach;with bed alarm set  OT Visit Diagnosis: Unsteadiness on feet (R26.81);Other symptoms and signs involving cognitive function                Time: 1703-1720 OT Time Calculation (min): 17 min Charges:  OT General Charges $OT Visit: 1 Visit OT Evaluation $OT Eval Moderate Complexity: 1 Mod  08/30/2021  RP, OTR/L  Acute Rehabilitation Services  Office:  [phone]   Metta Clines 08/30/2021, 5:29 PM

## 2021-08-30 NOTE — Progress Notes (Signed)
[facility] ROUNDING NOTE   Subjective:   The patient continues to feel well with good appetite.  Denies any nausea or vomiting.  Creatinine continues to slowly trend up.  Objective:  Vital signs in last 24 hours:  Temp:  [97.7 F (36.5 C)-98.3 F (36.8 C)] 98 F (36.7 C) (11/02 1159) Pulse Rate:  [66-75] 69 (11/02 1159) Resp:  [13-20] 13 (11/02 1159) BP: (113-151)/(43-79) 113/43 (11/02 1159) SpO2:  [97 %-100 %] 98 % (11/02 1159) Weight:  [70.3 kg] 70.3 kg (11/02 0600)  Weight change: 1.7 kg Filed Weights   08/28/21 0604 08/29/21 0341 08/30/21 0600  Weight: 65.6 kg 68.6 kg 70.3 kg    Intake/Output: I/O last 3 completed shifts: In: 1637.3 [P.O.:1160; I.V.:77.3; IV Piggyback:400] Out: 900 [Urine:900]   Intake/Output this shift:  Total I/O In: 360 [P.O.:360] Out: -  GEN: sitting in bed, no distress CVS-normal rate, no rub RS- no iwob, bilateral chest rise ABD- BS present soft non-distended EXT- no edema Neuro - awake, alert, oriented to person and place  Basic Metabolic Panel: Recent Labs  Lab 08/26/21 0103 08/26/21 0435 08/27/21 0400 08/28/21 0633 08/29/21 0350 08/30/21 0500  NA 144  --  144 141 132* 130*  K 3.2*  --  2.8* 3.0* 3.4* 3.9  CL 101  --  101 100 98 97*  CO2 29  --  29 28 24 24   GLUCOSE 203*  --  220* 151* 264* 257*  BUN 73*  --  76* 79* 79* 78*  CREATININE 3.54*  --  3.37* 3.88* 4.32* 4.60*  CALCIUM 9.6  --  9.3 9.1 8.4* 8.6*  MG  --  2.0 1.6* 2.4  --   --   PHOS 3.3  --  3.4  3.4 4.1 4.1 5.3*    Liver Function Tests: Recent Labs  Lab 08/26/21 0103 08/27/21 0400 08/28/21 0633 08/29/21 0350 08/30/21 0500  ALBUMIN 2.4* 2.3* 2.3* 2.2* 2.4*   No results for input(s): LIPASE, AMYLASE in the last 168 hours. Recent Labs  Lab 08/24/21 1718  AMMONIA 25    CBC: Recent Labs  Lab 08/26/21 0435 08/27/21 0400 08/28/21 0633 08/29/21 0350 08/30/21 0500  WBC 7.5 8.3 9.2 9.6 9.2  HGB 9.7* 9.2* 9.3* 7.9* 7.8*  HCT 31.0*  29.4* 29.3* 24.9* 24.2*  MCV 94.5 94.2 93.9 93.6 94.2  PLT 163 178 206 201 199    Cardiac Enzymes: Recent Labs  Lab 08/27/21 0400  CKTOTAL 71    BNP: Invalid input(s): POCBNP  CBG: Recent Labs  Lab 08/29/21 1156 08/29/21 1601 08/29/21 2055 08/30/21 0619 08/30/21 1155  GLUCAP 236* 190* 243* 227* 237*    Microbiology: Results for orders placed or performed during the hospital encounter of 08/16/21  Resp Panel by RT-PCR (Flu A&B, Covid) Nasopharyngeal Swab     Status: None   Collection Time: 08/16/21  8:28 PM   Specimen: Nasopharyngeal Swab; Nasopharyngeal(NP) swabs in vial transport medium  Result Value Ref Range Status   SARS Coronavirus 2 by RT PCR NEGATIVE NEGATIVE Final    Comment: (NOTE) SARS-CoV-2 target nucleic acids are NOT DETECTED.  The SARS-CoV-2 RNA is generally detectable in upper respiratory specimens during the acute phase of infection. The lowest concentration of SARS-CoV-2 viral copies this assay can detect is 138 copies/mL. A negative result does not preclude SARS-Cov-2 infection and should not be used as the sole basis for treatment or other patient management decisions. A negative result may occur with  improper specimen collection/handling, submission of  specimen other than nasopharyngeal swab, presence of viral mutation(s) within the areas targeted by this assay, and inadequate number of viral copies(<138 copies/mL). A negative result must be combined with clinical observations, patient history, and epidemiological information. The expected result is Negative.  Fact Sheet for Patients:  EntrepreneurPulse.com.au  Fact Sheet for Healthcare Providers:  IncredibleEmployment.be  This test is no t yet approved or cleared by the [place] FDA and  has been authorized for detection and/or diagnosis of SARS-CoV-2 by FDA under an Emergency Use Authorization (EUA). This EUA will remain  in effect (meaning this  test can be used) for the duration of the COVID-19 declaration under Section 564(b)(1) of the Act, 21 U.S.C.section 360bbb-3(b)(1), unless the authorization is terminated  or revoked sooner.       Influenza A by PCR NEGATIVE NEGATIVE Final   Influenza B by PCR NEGATIVE NEGATIVE Final    Comment: (NOTE) The Xpert Xpress SARS-CoV-2/FLU/RSV plus assay is intended as an aid in the diagnosis of influenza from Nasopharyngeal swab specimens and should not be used as a sole basis for treatment. Nasal washings and aspirates are unacceptable for Xpert Xpress SARS-CoV-2/FLU/RSV testing.  Fact Sheet for Patients: EntrepreneurPulse.com.au  Fact Sheet for Healthcare Providers: IncredibleEmployment.be  This test is not yet approved or cleared by the [place] FDA and has been authorized for detection and/or diagnosis of SARS-CoV-2 by FDA under an Emergency Use Authorization (EUA). This EUA will remain in effect (meaning this test can be used) for the duration of the COVID-19 declaration under Section 564(b)(1) of the Act, 21 U.S.C. section 360bbb-3(b)(1), unless the authorization is terminated or revoked.  Performed at [facility], [address] [place], [place] [address]   Urine Culture     Status: None   Collection Time: 08/17/21 11:00 AM   Specimen: Urine, Clean Catch  Result Value Ref Range Status   Specimen Description   Final    URINE, CLEAN CATCH Performed at [facility], [address], [place], [place] [address]    Special Requests   Final    NONE Performed at [facility], [address], [place], [place] [address]    Culture   Final    NO GROWTH Performed at [facility], [place] [address], [place], [place] [address]    Report Status 08/19/2021 FINAL  Final  Culture, blood (Routine X 2) w Reflex to ID Panel     Status: None   Collection Time: 08/19/21  7:28 AM   Specimen: BLOOD LEFT ARM  Result Value Ref Range Status    Specimen Description BLOOD LEFT ARM BOTTLES DRAWN AEROBIC AND ANAEROBIC  Final   Special Requests   Final    Blood Culture results may not be optimal due to an excessive volume of blood received in culture bottles   Culture   Final    NO GROWTH 5 DAYS Performed at [facility], [address] [place],  [address]    Report Status 08/24/2021 FINAL  Final  Culture, blood (Routine X 2) w Reflex to ID Panel     Status: None   Collection Time: 08/19/21  8:27 AM   Specimen: BLOOD LEFT HAND  Result Value Ref Range Status   Specimen Description   Final    BLOOD LEFT HAND BOTTLES DRAWN AEROBIC AND ANAEROBIC   Special Requests Blood Culture adequate volume  Final   Culture   Final    NO GROWTH 5 DAYS Performed at [facility], [place]  [address] [place], [place] [address]    Report Status 08/24/2021 FINAL  Final  MRSA Next Gen by PCR, Nasal     Status: Abnormal   Collection Time: 08/21/21  5:40 PM   Specimen: Nasal Mucosa; Nasal Swab  Result Value Ref Range Status   MRSA by PCR Next Gen DETECTED (A) NOT DETECTED Final    Comment: RESULT CALLED TO, READ BACK BY AND VERIFIED WITH: JESSICA HEARN RN,2021,08/21/2021, SELF S. (NOTE) The GeneXpert MRSA Assay (FDA approved for NASAL specimens only), is one component of a comprehensive MRSA colonization surveillance program. It is not intended to diagnose MRSA infection nor to guide or monitor treatment for MRSA infections. Test performance is not FDA approved in patients less than 53 years old. Performed at [facility], [address], [place], [place] [address]   MRSA Next Gen by PCR, Nasal     Status: Abnormal   Collection Time: 08/26/21 12:49 AM   Specimen: Nasal Mucosa; Nasal Swab  Result Value Ref Range Status   MRSA by PCR Next Gen DETECTED (A) NOT DETECTED Final    Comment: RESULT CALLED TO, READ BACK BY AND VERIFIED WITH: RN DAVID S.  [id number] FCP (NOTE) The GeneXpert MRSA Assay (FDA approved for NASAL specimens only), is one  component of a comprehensive MRSA colonization surveillance program. It is not intended to diagnose MRSA infection nor to guide or monitor treatment for MRSA infections. Test performance is not FDA approved in patients less than 36 years old. Performed at [facility], [place] [address], [place], [place] [address]   Respiratory (~20 pathogens) panel by PCR     Status: None   Collection Time: 08/26/21  9:31 AM   Specimen: Nasopharyngeal Swab; Respiratory  Result Value Ref Range Status   Adenovirus NOT DETECTED NOT DETECTED Final   Coronavirus 229E NOT DETECTED NOT DETECTED Final    Comment: (NOTE) The Coronavirus on the Respiratory Panel, DOES NOT test for the novel  Coronavirus (2019 nCoV)    Coronavirus HKU1 NOT DETECTED NOT DETECTED Final   Coronavirus NL63 NOT DETECTED NOT DETECTED Final   Coronavirus OC43 NOT DETECTED NOT DETECTED Final   Metapneumovirus NOT DETECTED NOT DETECTED Final   Rhinovirus / Enterovirus NOT DETECTED NOT DETECTED Final   Influenza A NOT DETECTED NOT DETECTED Final   Influenza B NOT DETECTED NOT DETECTED Final   Parainfluenza Virus 1 NOT DETECTED NOT DETECTED Final   Parainfluenza Virus 2 NOT DETECTED NOT DETECTED Final   Parainfluenza Virus 3 NOT DETECTED NOT DETECTED Final   Parainfluenza Virus 4 NOT DETECTED NOT DETECTED Final   Respiratory Syncytial Virus NOT DETECTED NOT DETECTED Final   Bordetella pertussis NOT DETECTED NOT DETECTED Final   Bordetella Parapertussis NOT DETECTED NOT DETECTED Final   Chlamydophila pneumoniae NOT DETECTED NOT DETECTED Final   Mycoplasma pneumoniae NOT DETECTED NOT DETECTED Final    Comment: Performed at [facility], [place]. [address] [place], [place] [address]    Coagulation Studies: No results for input(s): LABPROT, INR in the last 72 hours.   Urinalysis: Recent Labs    08/29/21 1030  COLORURINE YELLOW  LABSPEC 1.013  PHURINE 5.0  GLUCOSEU NEGATIVE  HGBUR SMALL*  BILIRUBINUR NEGATIVE  KETONESUR  NEGATIVE  PROTEINUR 100*  NITRITE NEGATIVE  LEUKOCYTESUR NEGATIVE      Imaging: MR ANGIO HEAD WO CONTRAST  Result Date: 08/28/2021 CLINICAL DATA:  Neuro deficit, stroke seen on 08/27/2021 MRI head EXAM: MRA HEAD WITHOUT CONTRAST TECHNIQUE: Angiographic images  of the Circle of Willis were acquired using MRA technique without intravenous contrast. COMPARISON:  08/16/2021 FINDINGS: Anterior circulation: Both internal carotid arteries are patent to the termini, without stenosis or other abnormality. Hypoplastic left A1. Better visualization of the distal left A1 and proximal left A2 than on the prior exam. Normal right A1. Normal anterior communicating artery. Anterior cerebral arteries are patent to their distal aspects. No M1 stenosis or occlusion. Normal MCA bifurcations. Distal MCA branches perfused and symmetric. Posterior circulation: Vertebral arteries widely patent to the vertebrobasilar junction without stenosis. Posterior inferior cerebral arteries patent bilaterally. Basilar patent to its distal aspect. Superior cerebral arteries patent bilaterally. Hypoplastic left P1 with near fetal origin of the left PCA, with a prominent left posterior communicating artery. The right posterior communicating artery is not visualized. Normal right P1. PCAs well perfused to their distal aspects without stenosis. Anatomic variants: None significant Other: None. IMPRESSION: 1. No intracranial large vessel occlusion. 2. Better visualization of the distal left A1 and proximal left A2 than on the prior exam, likely hypoplastic but without focal stenosis. Electronically Signed   By: Merilyn Baba M.D.   On: 08/28/2021 18:01     Medications:    sodium chloride 250 mL (08/26/21 1235)   cefTRIAXone (ROCEPHIN)  IV 2 g (08/30/21 0906)    allopurinol  100 mg Oral Daily   aspirin EC  81 mg Oral Daily   atorvastatin  80 mg Oral QHS   Chlorhexidine Gluconate Cloth  6 each Topical Daily   diltiazem  60 mg Oral Q8H    hydrALAZINE  50 mg Oral Q8H   insulin aspart  0-9 Units Subcutaneous TID WC   insulin detemir  15 Units Subcutaneous QHS   labetalol  200 mg Oral BID   lipase/protease/amylase  36,000-72,000 Units Oral See admin instructions   pantoprazole  40 mg Oral Daily   sodium chloride flush  3 mL Intravenous Q12H   vancomycin variable dose per unstable renal function (pharmacist dosing)   Does not apply See admin instructions   sodium chloride, acetaminophen, diphenhydrAMINE, diphenoxylate-atropine, haloperidol lactate, hydrALAZINE, HYDROmorphone (DILAUDID) injection, HYDROmorphone, ipratropium-albuterol, LORazepam, meclizine, ondansetron **OR** ondansetron (ZOFRAN) IV, sodium chloride flush, temazepam  Assessment/ Plan:  AKI/CKD stage IIIb - in setting of PNA and hypertensive urgency and chronic diarrhea.  Baseline Scr variable 1.6-2.2 and follows Dr. Theador Hawthorne as an outpatient. Required HD 10/25 due to hyperkalemia with UF of 2 liters.   Serologies negative    Urine output was noted to increase but not well documented. Does not appear volume overloaded.  Tried gentle hydration on 10/31 without much effect Creatinine continues to slowly uptrend to 4.6 today and no obvious signs of uremia.  I think this is mostly hemodynamic with adjustments related to blood pressure with no obvious concerns on urinalysis and no obvious signs of retention.  Not much else I think we can do at this point other than encourage hydration and continue to monitor for stabilization and hopefully improvement in the creatinine. Maintain temp HD catheter for now but if continued improvement in coming days will d/c Acute hypoxic respiratory failure - due to multifocal pneumonia vs CHF.  Currently on Abx per primary.   last dialysis 10/25 with 2 L removed. Negative balance noted . V/Q negative   HTN urgency/emergency -much improved at this time.  Continue current medications.  Decrease medications if systolic is consistently lower than  120 AMS  much improved; multifactorial Anemia of critical illness - s/p transfusion.  Continue  to follow H/H.  Thrombocytopenia -now normalized.  Possibly was related to hypertension    LOS: 13

## 2021-08-30 NOTE — Progress Notes (Signed)
Inpatient Diabetes Program Recommendations  AACE/ADA: New Consensus Statement on Inpatient Glycemic Control   Target Ranges:  Prepandial:   less than 140 mg/dL      Peak postprandial:   less than 180 mg/dL (1-2 hours)      Critically ill patients:  140 - 180 mg/dL   Results for Bihm, Heather A "[name]" (MRN [id number]) as of 08/30/2021 11:18  Ref. Range 08/29/2021 06:05 08/29/2021 11:56 08/29/2021 16:01 08/29/2021 20:55 08/30/2021 06:19  Glucose-Capillary Latest Ref Range: 70 - 99 mg/dL 215 (H) 236 (H) 190 (H) 243 (H) 227 (H)    Review of Glycemic Control  Diabetes history: DM2 Outpatient Diabetes medications: Levemir 70 units QHS, Humalog 20-26 units TID with meals Current orders for Inpatient glycemic control: Levemir 15 units QHS, Novolog 0-9 units TID with meals   Inpatient Diabetes Program Recommendations:    Insulin: Please consider increasing Levemir to 17 units QHS, adding Novolog 0-5 units QHS for bedtime correction, and adding Novolog 2 units TID with meals for meal coverage if patient eats at least 50% of meals.  Thanks, Heather Alderman, RN, MSN, CDE Diabetes Coordinator Inpatient Diabetes Program [phone] (Team Pager from 8am to 5pm)

## 2021-08-30 NOTE — Progress Notes (Signed)
Mobility Specialist: Progress Note   08/30/21 1643  Mobility  Activity Ambulated to bathroom  Level of Assistance Minimal assist, patient does 75% or more  Assistive Device  (HHA)  Distance Ambulated (ft) 40 ft  Mobility Ambulated with assistance in room;Out of bed for toileting  Mobility Response Tolerated well  Mobility performed by Mobility specialist  $Mobility charge 1 Mobility   Post-Mobility: 61 HR  Pt requesting to use BR upon entering room. Pt required minA to stand and HHA contact guard during ambulation. Pt to bed after walk with call bell and phone at her side. Bed alarm is on. Pt requesting pain medication for back pain, no rating given.   [facility] Soha Thorup Mobility Specialist Mobility Specialist Phone: [phone]

## 2021-08-30 NOTE — Evaluation (Signed)
Physical Therapy Evaluation Patient Details Name: Heather [name] MRN: [id number] DOB: 07/09/1951 Today's Date: 08/30/2021  History of Present Illness  Pt is a 70 y.o. female who presented 08/16/21 with blurred vision, HTN, and headache. Found to have altered mental status with AKI on CKD, needed dialysis on 10/23. EEG mild diffuse slowing, MRI on 10/19 no acute stroke, MRA left ACA stenosis, MRV negative. However on 10/30 MRI showed left cerebellum and left frontal small/punctate infarcts. S/p temporary dialysis central line placement R internal jugular 10/23. PMH: CKD 3, Crohn's disease, hypertension, diabetes mellitus, BPPV stroke   Clinical Impression  Pt presents with condition above and deficits mentioned below, see PT Problem List. PTA, she was independent with functional mobility without AD, living with her husband in a 1-level house with a ramped entrance. Pt does report a hx of x2 falls in the past 6 months when reaching off BOS. Currently, pt displays deficits in memory, problem-solving,a and safety awareness. In addition, pt was hallucinating multiple people in the room throughout the session. Pt limited in mobility by symptomatic orthostatic hypotension, see below. However, if she were not lightheaded due to this she would likely be able to ambulate further distances at a min guard-supervision level using a RW. Today, she required intermittent bouts of minA to ambulate within her room with her RW due to her symptoms. Pt is at risk for falls. Pt reports she can have 24/7 assistance from her son or grandsons upon d/c home. Recommending follow-up HHPT with 24/7 supervision/assistance at this time due to her deficits in balance and cognition. Will continue to follow acutely.  BP: 128/53 supine 113/58 sitting 98/69 standing 101/78 sitting after ambulating 104/93 after reclining end of session  *reports lightheadedness and headache with changes in position     Recommendations for  follow up therapy are one component of a multi-disciplinary discharge planning process, led by the attending physician.  Recommendations may be updated based on patient status, additional functional criteria and insurance authorization.  Follow Up Recommendations Home health PT    Assistance Recommended at Discharge Frequent or constant Supervision/Assistance  Functional Status Assessment Patient has had a recent decline in their functional status and demonstrates the ability to make significant improvements in function in a reasonable and predictable amount of time.  Equipment Recommendations  None recommended by PT (has necessary equipment)    Recommendations for Other Services       Precautions / Restrictions Precautions Precautions: Fall;Other (comment) Precaution Comments: monitor BP Restrictions Weight Bearing Restrictions: No      Mobility  Bed Mobility Overal bed mobility: Needs Assistance Bed Mobility: Supine to Sit     Supine to sit: Supervision;HOB elevated     General bed mobility comments: Extra time time, supervision for safety with HOB elevated.    Transfers Overall transfer level: Needs assistance Equipment used: Rolling walker (2 wheels) Transfers: Sit to/from Stand Sit to Stand: Min guard           General transfer comment: Sit to stand from EOB 2x, cuing for hand placement, min guard assist for safety, no LOB.    Ambulation/Gait Ambulation/Gait assistance: Min guard;Min assist Gait Distance (Feet): 15 Feet (x2 bouts of ~5 ft > ~15 ft) Assistive device: Rolling walker (2 wheels) Gait Pattern/deviations: Step-through pattern;Decreased stride length Gait velocity: reduced Gait velocity interpretation: <1.31 ft/sec, indicative of household ambulator General Gait Details: Pt with lightheadedness in standing, resulting in trunk sway and intermittent minA to maintain balance or manage RW in tight  spaces. Pt taking steps around EOB to ensure safety with  place to sit if needed.  Stairs            Wheelchair Mobility    Modified Rankin (Stroke Patients Only) Modified Rankin (Stroke Patients Only) Pre-Morbid Rankin Score: No symptoms Modified Rankin: Moderately severe disability     Balance Overall balance assessment: Needs assistance Sitting-balance support: No upper extremity supported;Feet supported Sitting balance-Leahy Scale: Good     Standing balance support: Reliant on assistive device for balance Standing balance-Leahy Scale: Poor Standing balance comment: RW for balance.                             Pertinent Vitals/Pain Pain Assessment: 0-10 Pain Score: 7  Pain Location: mid back Pain Descriptors / Indicators: Stabbing Pain Intervention(s): Limited activity within patient's tolerance;Monitored during session;Repositioned    Home Living Family/patient expects to be discharged to:: Private residence Living Arrangements: Spouse/significant other Available Help at Discharge: Family;Available 24 hours/day Type of Home: House Home Access: Ramped entrance       Home Layout: One level Home Equipment: Rollator (4 wheels);Cane - single point;Crutches;BSC;Tub bench;Grab bars - tub/shower;Wheelchair - Publishing copy (2 wheels);Cane - quad      Prior Function Prior Level of Function : Independent/Modified Independent;Driving;History of Falls (last six months)             Mobility Comments: x2 falls in past 6 months in which pt reports occurred when reaching off BOS. Pt reports she does not use AD. ADLs Comments: Pt stands for showers and to donn pants.     Hand Dominance   Dominant Hand: Right    Extremity/Trunk Assessment   Upper Extremity Assessment Upper Extremity Assessment: Defer to OT evaluation    Lower Extremity Assessment Lower Extremity Assessment: Generalized weakness (Fairly symmetrical bil lower extremity weakness, grossly MMT scores of 4- hip flexion bil, 4+ knee  extension bil, 4 ankle dorsiflexion bil; denied numbness/tingling and able to identify location of light touch with precision with increased processingtime)    Cervical / Trunk Assessment Cervical / Trunk Assessment: Normal  Communication   Communication: No difficulties  Cognition Arousal/Alertness: Awake/alert Behavior During Therapy: WFL for tasks assessed/performed Overall Cognitive Status: Impaired/Different from baseline Area of Impairment: Memory;Safety/judgement;Awareness;Problem solving                     Memory: Decreased short-term memory   Safety/Judgement: Decreased awareness of safety;Decreased awareness of deficits Awareness: Emergent Problem Solving: Slow processing;Difficulty sequencing;Requires verbal cues General Comments: Pt reports not remembering anything during hospitalization until yesterday. Pt hallucinating multiple people in room throughout session, RN made aware. Poor awareness into her symptoms/deficits to maintain her safety, needing repeated cuing to notify PT of her sensations.        General Comments General comments (skin integrity, edema, etc.): SpO2 WNL; BP 128/53 supine, 113/58 sitting, 98/69 standing, 101/78 sitting after ambulating, 104/93 after reclining end of session; reports lightheadedness and headache with changes in position; notified RN of pain, hallucinations, and orthostatic hypotension; reports double vision when both eyes open, blurry vision with either eye closed    Exercises     Assessment/Plan    PT Assessment Patient needs continued PT services  PT Problem List Decreased strength;Decreased activity tolerance;Decreased balance;Decreased mobility;Decreased coordination;Decreased cognition;Decreased knowledge of use of DME;Decreased safety awareness;Cardiopulmonary status limiting activity       PT Treatment Interventions DME instruction;Gait training;Functional mobility training;Therapeutic activities;Therapeutic  exercise;Neuromuscular re-education;Balance training;Cognitive remediation;Patient/family education    PT Goals (Current goals can be found in the Care Plan section)  Acute Rehab PT Goals Patient Stated Goal: to get better to go home PT Goal Formulation: With patient Time For Goal Achievement: 09/13/21 Potential to Achieve Goals: Good    Frequency Min 4X/week   Barriers to discharge        Co-evaluation               AM-PAC PT "6 Clicks" Mobility  Outcome Measure Help needed turning from your back to your side while in a flat bed without using bedrails?: A Little Help needed moving from lying on your back to sitting on the side of a flat bed without using bedrails?: A Little Help needed moving to and from a bed to a chair (including a wheelchair)?: A Little Help needed standing up from a chair using your arms (e.g., wheelchair or bedside chair)?: A Little Help needed to walk in hospital room?: A Little Help needed climbing 3-5 steps with a railing? : A Little 6 Click Score: 18    End of Session Equipment Utilized During Treatment: Gait belt Activity Tolerance: Treatment limited secondary to medical complications (Comment) (orthostatic hypotension) Patient left: in chair;with call bell/phone within reach;with chair alarm set Nurse Communication: Mobility status;Patient requests pain meds (hallucinations and orthostatic hypotension) PT Visit Diagnosis: Unsteadiness on feet (R26.81);Other abnormalities of gait and mobility (R26.89);Muscle weakness (generalized) (M62.81);History of falling (Z91.81);Difficulty in walking, not elsewhere classified (R26.2);Dizziness and giddiness (R42)    Time: 0488-8916 PT Time Calculation (min) (ACUTE ONLY): 37 min   Charges:   PT Evaluation $PT Eval Moderate Complexity: 1 Mod PT Treatments $Therapeutic Activity: 8-22 mins        Moishe Spice, PT, DPT Acute Rehabilitation Services  Pager: [phone] Office:  [phone]   Orvan Falconer 08/30/2021, 4:29 PM

## 2021-08-30 NOTE — Progress Notes (Addendum)
PROGRESS NOTE    VIANKA ERTEL  LZJ:[id number] DOB: [birth year] DOA: 08/16/2021 PCP: [name], MD    Brief Narrative:  Mrs. Guerin was admitted to the hospital the working diagnosis of uncontrolled HTN (urgency) in the setting of AKI on CKD 3b.  Required renal replacement therapy.   70 year old female past medical history for chronic kidney disease stage III b, Crohn's disease, hypertension, type 2 diabetes mellitus and CVA who presented with blurry vision.  Reported 2 days from COVID, associated with stabbing headache, no nausea no vomiting.  Because of persistent symptoms she came to the hospital for further evaluation. On her initial physical examination temperature 98.3, heart rate 70-95, respiratory rate 20, blood pressure systolic 024-097, her lungs are clear to auscultation bilaterally, heart S1-S2, present, rhythmic, soft abdomen, no lower extremity edema.  Petechial rash lower extremities.  Sodium 139, potassium 3.1, chloride 106, bicarb 21, glucose 43, BUN 56, creatinine 3.23, magnesium 0.7, white count 9.4, hemoglobin 9.7, hematocrit 30.4, platelets 216,  SARS COVID-19 negative.  Urinalysis specific gravity 1.013, 100 protein.  Protein creatinine ratio 2.29.  Brain MRI/ MRA with no acute changes.  EKG 92 bpm, normal axis, normal intervals, sinus rhythm, no significant ST segment changes, negative T wave lead II, lead III, aVF, V4-V6, positive LVH.  Further work-up with CT chest that showed multifocal pneumonia, bilateral pleural effusions, pulmonary edema. Patient was cleared for supplemental oxygen and antibiotic therapy. For volume overload he received furosemide.  She had worsening kidney function, eventually placed on renal replacement therapy.  Patient is very weak and deconditioned, plan to transfer to skilled nursing facility.   Assessment & Plan:   Principal Problem:   Blurry vision, bilateral Active Problems:   Essential hypertension   Type 2  diabetes with nephropathy (HCC)   Depression   Acute kidney injury superimposed on CKD (HCC)   Crohn's disease of small intestine with other complication (HCC)   Fusion of spine, cervicothoracic region   Hypomagnesemia   Acute hypokalemia   Acute respiratory failure with hypoxia (HCC)   Acute metabolic encephalopathy   Malignant hypertension   Thrombocytopenia (HCC)   Anemia due to chronic kidney disease   Pressure injury of skin   Cerebral thrombosis with cerebral infarction   Cerebrovascular accident (CVA) due to embolism of cerebral artery ([place])  Acute hypoxemic respiratory failure due to multifocal pneumonia, and pulmonary edema. Acute on chronic diastolic heart failure  Patient has been on ceftriaxone and vancomycin for atypical infection, before was on cefepime and linezolid. No leukocytosis and has been afebrile. Chest film from 10/30 personally reviewed noted upper lobes interstitial infiltrates.   Today is day 9 of antibiotic therapy, will discontinue vancomycin and ceftriaxone at this point and continue close monitoring.  Patient had diuresis with furosemide and ultrafiltration per HD, her oxygenation today is 97 to 98% on room air.   2. AKI on CKD stage 3b. Non anion gap metabolic acidosis Patient clinically euvolemic today, she had HD on 10/23 and 10/25.  Her urine output over last 24 hrs is 1,709 ml Renal function with serum cr at 4.6, k is 3,9 and serum bicarbonate at 24. Continue close follow up on renal function and electrolytes. Plan to remove foley in 48 hrs, if no further close urine out monitoring needed.   3. Metabolic encephalopathy, headache and diplopia. Patient today mildly somnolent but able to answer questions and follow commands.  Continue neuro checks per unit protocol.  Las dose of haldol was on 10/30, will  reduce dose from 2 mg to 1 mg as needed.  Change lorazepam from IV to po as needed for anxiety.   Stage 1 right buttock pressure ulcer present on  admission. Continue with local skin care.   4. T2DM with dyslipidemia. Continue glucose cover and monitoring Patient is tolerating po well Continue statin therapy.  Basal insulin 15 units.   5. Crohn's disease. No acute flare, patient has been on immunosuppression with Humira.   6. Acute on chronic anemia, reactive thrombocytopenia.  Patient had 2 units PRBC transfusion Hgb today is 7.8 with hct at 24.2, Plt is 199.  7. HTN. Continue blood pressure control with diltiazem, labelatalol and hydralazine    Patient continue to be at high risk for worsening renal faulure   Status is: Inpatient    DVT prophylaxis: Heparin   Code Status:    full  Family Communication:   No family at the beside      Nutrition Status: Nutrition Problem: Inadequate oral intake Etiology: lethargy/confusion Signs/Symptoms: energy intake < or equal to 50% for > or equal to 5 days Interventions: Tube feeding     Skin Documentation: Pressure Injury 08/26/21 Buttocks Right;Left Stage 1 -  Intact skin with non-blanchable redness of a localized area usually over a bony prominence. (Active)  08/26/21 0050  Location: Buttocks  Location Orientation: Right;Left  Staging: Stage 1 -  Intact skin with non-blanchable redness of a localized area usually over a bony prominence.  Wound Description (Comments):   Present on Admission: Yes     Consultants:  Nephrology   Procedures:  HD catheter    Subjective: Patient is feeling better, no nausea or vomiting, continue to be very weak and deconditioned,   Objective: Vitals:   08/30/21 0004 08/30/21 0500 08/30/21 0600 08/30/21 1159  BP: (!) 142/60 (!) 126/49  (!) 113/43  Pulse: 72 66  69  Resp: 16 20  13   Temp: 97.7 F (36.5 C) 97.9 F (36.6 C)  98 F (36.7 C)  TempSrc: Oral Oral  Oral  SpO2: 97% 97%  98%  Weight:   70.3 kg   Height:        Intake/Output Summary (Last 24 hours) at 08/30/2021 1327 Last data filed at 08/30/2021 0900 Gross per 24  hour  Intake 860 ml  Output 500 ml  Net 360 ml   Filed Weights   08/28/21 0604 08/29/21 0341 08/30/21 0600  Weight: 65.6 kg 68.6 kg 70.3 kg    Examination:   General: Not in pain or dyspnea. Deconditioned  Neurology: Awake and alert, non focal, mild somnolent, but able to answer questions and follow commands.   E ENT: no pallor, no icterus, oral mucosa moist Cardiovascular: No JVD. S1-S2 present, rhythmic, no gallops, rubs, or murmurs. No lower extremity edema. Pulmonary: positive breath sounds bilaterally, adequate air movement, no wheezing, rhonchi or rales. Gastrointestinal. Abdomen soft and non tender Skin. No rashes Musculoskeletal: no joint deformities     Data Reviewed: I have personally reviewed following labs and imaging studies  CBC: Recent Labs  Lab 08/26/21 0435 08/27/21 0400 08/28/21 0633 08/29/21 0350 08/30/21 0500  WBC 7.5 8.3 9.2 9.6 9.2  HGB 9.7* 9.2* 9.3* 7.9* 7.8*  HCT 31.0* 29.4* 29.3* 24.9* 24.2*  MCV 94.5 94.2 93.9 93.6 94.2  PLT 163 178 206 201 619   Basic Metabolic Panel: Recent Labs  Lab 08/26/21 0103 08/26/21 0435 08/27/21 0400 08/28/21 0633 08/29/21 0350 08/30/21 0500  NA 144  --  144 141 132*  130*  K 3.2*  --  2.8* 3.0* 3.4* 3.9  CL 101  --  101 100 98 97*  CO2 29  --  29 28 24 24   GLUCOSE 203*  --  220* 151* 264* 257*  BUN 73*  --  76* 79* 79* 78*  CREATININE 3.54*  --  3.37* 3.88* 4.32* 4.60*  CALCIUM 9.6  --  9.3 9.1 8.4* 8.6*  MG  --  2.0 1.6* 2.4  --   --   PHOS 3.3  --  3.4  3.4 4.1 4.1 5.3*   GFR: Estimated Creatinine Clearance: 11.1 mL/min (A) (by C-G formula based on SCr of 4.6 mg/dL (H)). Liver Function Tests: Recent Labs  Lab 08/26/21 0103 08/27/21 0400 08/28/21 0633 08/29/21 0350 08/30/21 0500  ALBUMIN 2.4* 2.3* 2.3* 2.2* 2.4*   No results for input(s): LIPASE, AMYLASE in the last 168 hours. Recent Labs  Lab 08/24/21 1718  AMMONIA 25   Coagulation Profile: Recent Labs  Lab 08/26/21 1343  INR 1.2    Cardiac Enzymes: Recent Labs  Lab 08/27/21 0400  CKTOTAL 71   BNP (last 3 results) No results for input(s): PROBNP in the last 8760 hours. HbA1C: Recent Labs    08/27/21 1558  HGBA1C 6.1*   CBG: Recent Labs  Lab 08/29/21 1156 08/29/21 1601 08/29/21 2055 08/30/21 0619 08/30/21 1155  GLUCAP 236* 190* 243* 227* 237*   Lipid Profile: Recent Labs    08/27/21 1557  CHOL 107  HDL 32*  LDLCALC 40  TRIG 175*  CHOLHDL 3.3   Thyroid Function Tests: No results for input(s): TSH, T4TOTAL, FREET4, T3FREE, THYROIDAB in the last 72 hours. Anemia Panel: Recent Labs    08/27/21 1529  VITAMINB12 1,417*      Radiology Studies: I have reviewed all of the imaging during this hospital visit personally     Scheduled Meds:  allopurinol  100 mg Oral Daily   aspirin EC  81 mg Oral Daily   atorvastatin  80 mg Oral QHS   Chlorhexidine Gluconate Cloth  6 each Topical Daily   diltiazem  60 mg Oral Q8H   hydrALAZINE  50 mg Oral Q8H   insulin aspart  0-9 Units Subcutaneous TID WC   insulin detemir  15 Units Subcutaneous QHS   labetalol  200 mg Oral BID   lipase/protease/amylase  36,000-72,000 Units Oral See admin instructions   pantoprazole  40 mg Oral Daily   sodium chloride flush  3 mL Intravenous Q12H   vancomycin variable dose per unstable renal function (pharmacist dosing)   Does not apply See admin instructions   Continuous Infusions:  sodium chloride 250 mL (08/26/21 1235)   cefTRIAXone (ROCEPHIN)  IV 2 g (08/30/21 0906)     LOS: 13 days        Matilda Fleig Gerome Apley, MD

## 2021-08-31 DIAGNOSIS — D631 Anemia in chronic kidney disease: Secondary | ICD-10-CM

## 2021-08-31 DIAGNOSIS — N1832 Chronic kidney disease, stage 3b: Secondary | ICD-10-CM

## 2021-08-31 LAB — RENAL FUNCTION PANEL
Albumin: 2.4 g/dL — ABNORMAL LOW (ref 3.5–5.0)
Anion gap: 10 (ref 5–15)
BUN: 80 mg/dL — ABNORMAL HIGH (ref 8–23)
CO2: 23 mmol/L (ref 22–32)
Calcium: 8.8 mg/dL — ABNORMAL LOW (ref 8.9–10.3)
Chloride: 97 mmol/L — ABNORMAL LOW (ref 98–111)
Creatinine, Ser: 4.93 mg/dL — ABNORMAL HIGH (ref 0.44–1.00)
GFR, Estimated: 9 mL/min — ABNORMAL LOW (ref >60)
Glucose, Bld: 269 mg/dL — ABNORMAL HIGH (ref 70–99)
Phosphorus: 6.2 mg/dL — ABNORMAL HIGH (ref 2.5–4.6)
Potassium: 4.3 mmol/L (ref 3.5–5.1)
Sodium: 130 mmol/L — ABNORMAL LOW (ref 135–145)

## 2021-08-31 LAB — GLUCOSE, CAPILLARY
Glucose-Capillary: 229 mg/dL — ABNORMAL HIGH (ref 70–99)
Glucose-Capillary: 291 mg/dL — ABNORMAL HIGH (ref 70–99)
Glucose-Capillary: 182 mg/dL — ABNORMAL HIGH (ref 70–99)
Glucose-Capillary: 83 mg/dL (ref 70–99)

## 2021-08-31 MED ORDER — HEPARIN SODIUM (PORCINE) 5000 UNIT/ML IJ SOLN
5000.0000 [IU] | Freq: Three times a day (TID) | INTRAMUSCULAR | Status: DC
  Administered 2021-08-31 – 2021-09-06 (×19): 5000 [IU] via SUBCUTANEOUS
  Filled 2021-08-31 (×17): qty 1

## 2021-08-31 MED ORDER — INSULIN ASPART 100 UNIT/ML IJ SOLN: 0.0000 [IU] | Freq: Three times a day (TID) | INTRAMUSCULAR | Status: DC

## 2021-08-31 MED ORDER — SODIUM CHLORIDE 0.9 % IV SOLN: INTRAVENOUS | Status: AC

## 2021-08-31 MED ORDER — INSULIN ASPART 100 UNIT/ML IJ SOLN
2.0000 [IU] | Freq: Three times a day (TID) | INTRAMUSCULAR | Status: DC
  Administered 2021-08-31 – 2021-09-04 (×13): 2 [IU] via SUBCUTANEOUS

## 2021-08-31 MED ORDER — INSULIN ASPART 100 UNIT/ML IJ SOLN
0.0000 [IU] | Freq: Every day | INTRAMUSCULAR | Status: DC
  Administered 2021-09-01: 2 [IU] via SUBCUTANEOUS

## 2021-08-31 MED ORDER — GLUCERNA SHAKE PO LIQD
237.0000 mL | Freq: Three times a day (TID) | ORAL | Status: DC
  Administered 2021-08-31 – 2021-09-04 (×13): 237 mL via ORAL

## 2021-08-31 MED ORDER — HYDRALAZINE HCL 25 MG PO TABS
25.0000 mg | ORAL_TABLET | Freq: Three times a day (TID) | ORAL | Status: DC
  Administered 2021-08-31 – 2021-09-01 (×3): 25 mg via ORAL
  Filled 2021-08-31 (×3): qty 1

## 2021-08-31 MED ORDER — ADULT MULTIVITAMIN W/MINERALS CH
1.0000 | ORAL_TABLET | Freq: Every day | ORAL | Status: DC
  Administered 2021-08-31 – 2021-09-04 (×5): 1 via ORAL
  Filled 2021-08-31 (×5): qty 1

## 2021-08-31 MED ORDER — INSULIN DETEMIR 100 UNIT/ML ~~LOC~~ SOLN
17.0000 [IU] | Freq: Every day | SUBCUTANEOUS | Status: DC
  Administered 2021-08-31 – 2021-09-05 (×6): 17 [IU] via SUBCUTANEOUS
  Filled 2021-08-31 (×7): qty 0.17

## 2021-08-31 NOTE — Progress Notes (Signed)
PROGRESS NOTE    Heather [name]  BLT:[id number] DOB: [birth year] DOA: 08/16/2021 PCP: [name], MD    Brief Narrative:  Heather [name] was admitted to the hospital the working diagnosis of uncontrolled HTN (urgency) in the setting of AKI on CKD 3b.  Required renal replacement therapy x58.    70 year old female past medical history for chronic kidney disease stage III b, Crohn's disease, hypertension, type 2 diabetes mellitus and CVA who presented with blurry vision.  Reported 2 days of blurred vision, associated with stabbing headache, no nausea and no vomiting.  Because of persistent symptoms she came to the hospital for further evaluation. On her initial physical examination temperature 98.3, heart rate 70-95, respiratory rate 20, blood pressure systolic 007-622, her lungs were clear to auscultation bilaterally, heart S1-S2, present, rhythmic, soft abdomen, no lower extremity edema.  Petechial rash lower extremities.   Sodium 139, potassium 3.1, chloride 106, bicarb 21, glucose 43, BUN 56, creatinine 3.23, magnesium 0.7, white count 9.4, hemoglobin 9.7, hematocrit 30.4, platelets 216,  SARS COVID-19 negative.   Urinalysis specific gravity 1.013, 100 protein.  Protein creatinine ratio 2.29.   Brain MRI/ MRA with no acute changes.   EKG 92 bpm, normal axis, normal intervals, sinus rhythm, no significant ST segment changes, negative T wave lead II, lead III, aVF, V4-V6, positive LVH.   Further work-up with CT chest that showed multifocal pneumonia, bilateral pleural effusions, pulmonary edema. Patient was placed on supplemental oxygen and antibiotic therapy. For volume overload he received furosemide.   She had worsening kidney function with hyperkalemia and hypervolemia, requiring renal replacement therapy x2 (10/23 and 10/25).    Patient was treated with ceftriaxone and vancomycin for atypical infection, before that she was on cefepime and linezolid. After 9 days of therapy her  antibiotics have been discontinued.   Her renal function still not stable. Pending removal of HD catheter before she can go home with home health services.   Assessment & Plan:   Principal Problem:   Blurry vision, bilateral Active Problems:   Essential hypertension   Type 2 diabetes with nephropathy (HCC)   Depression   Acute kidney injury superimposed on CKD (HCC)   Crohn's disease of small intestine with other complication (HCC)   Fusion of spine, cervicothoracic region   Hypomagnesemia   Acute hypokalemia   Acute respiratory failure with hypoxia (HCC)   Acute metabolic encephalopathy   Malignant hypertension   Thrombocytopenia (HCC)   Anemia due to chronic kidney disease   Pressure injury of skin   Cerebral thrombosis with cerebral infarction   Cerebrovascular accident (CVA) due to embolism of cerebral artery ([place])   Acute hypoxemic respiratory failure due to multifocal pneumonia, and pulmonary edema. Acute on chronic diastolic heart failure  Completed antibiotic therapy. No dyspnea, and her oxygenation is 98% on room air. Continue to encourage mobility and getting out of bed to chair tid with meals. Will need home health services at discharge.    2. AKI on CKD stage 3b. Non anion gap metabolic acidosis, hyponatremia. HD on 10/23 and 10/25.  Urine output over last 24 hrs is 1,470 ml Clinically mild hypovolemic, patient with poor oral intake.  Follow up renal function with serum cr at  4,9 with K at 4,3 and serum bicarbonate at 23.  Na is 130 and Cl 97.    Plan for isotonic saline infusion today and follow up on renal function in am. When serum cr trending down plan to remove HD cathter per nephrology  recommendations.   3. Metabolic encephalopathy, headache and diplopia/ neck pain Last night patient reported to have hallucinations.  Had one mg lorazepam but not haloperidol.   Plan to continue neuro checks per unit protocol.  Continue cyclobenzaprine for back and  neck pain, will add topical diclofenac.  Continue with as needed lorazepam, haldol and qhs temazepam.   Stage 1 right buttock pressure ulcer present on admission. Continue with local skin care.    4. T2DM with dyslipidemia.  Fasting glucose 269, capillary 229 and 291,  Continue with insulin sliding scale, increase basal insulin to 17 units and will add pre-meal 2 units, add qhs sliding scale. Continue close follow up on glucose.   On atorvastatin.    5. Crohn's disease.  At home on immunosuppression with Humira.    6. Acute on chronic anemia, reactive thrombocytopenia.  Patient had 2 units PRBC transfusion  Hgb has been in the 7.8 and 7,9 range, with no signs of bleeding.   7. HTN.  Systolic blood pressure 921 to 168 mmHg,  Continue with diltiazem, hydralazine and labetalol.   Patient continue to be at high risk for worsening renal function   Status is: Inpatient  Remains inpatient appropriate because: renal function monitoring, not yet stable.    DVT prophylaxis: heparin Code Status:    full  Family Communication:  No family at the bedside      Nutrition Status: Nutrition Problem: Inadequate oral intake Etiology: lethargy/confusion Signs/Symptoms: energy intake < or equal to 50% for > or equal to 5 days Interventions: Tube feeding     Skin Documentation: Pressure Injury 08/26/21 Buttocks Right;Left Stage 1 -  Intact skin with non-blanchable redness of a localized area usually over a bony prominence. (Active)  08/26/21 0050  Location: Buttocks  Location Orientation: Right;Left  Staging: Stage 1 -  Intact skin with non-blanchable redness of a localized area usually over a bony prominence.  Wound Description (Comments):   Present on Admission: Yes     Consultants:  Nephrology     Subjective: Patient with no nausea or vomiting, continue to have neck pain and back pain, last night confusion and hallucinations.   Objective: Vitals:   08/30/21 2312 08/31/21  0331 08/31/21 0825 08/31/21 0828  BP: (!) 168/61 (!) 126/43 (!) 144/51 (!) 144/51  Pulse: 87 67 78 78  Resp: 20 18  15   Temp: 97.8 F (36.6 C) 98.6 F (37 C)  97.8 F (36.6 C)  TempSrc: Oral Oral  Oral  SpO2: 95% 97%  99%  Weight:  69.1 kg    Height:        Intake/Output Summary (Last 24 hours) at 08/31/2021 1122 Last data filed at 08/30/2021 2314 Gross per 24 hour  Intake 1260 ml  Output 150 ml  Net 1110 ml   Filed Weights   08/29/21 0341 08/30/21 0600 08/31/21 0331  Weight: 68.6 kg 70.3 kg 69.1 kg    Examination:   General: Not in pain or dyspnea, deconditioned  Neurology: Awake and alert, non focal  E ENT: mild pallor, no icterus, oral mucosa moist Cardiovascular: No JVD. S1-S2 present, rhythmic, no gallops, rubs, or murmurs. No lower extremity edema. Pulmonary: positive breath sounds bilaterally, adequate air movement, no wheezing, rhonchi or rales. Gastrointestinal. Abdomen soft and non tender Skin. No rashes Musculoskeletal: no joint deformities     Data Reviewed: I have personally reviewed following labs and imaging studies  CBC: Recent Labs  Lab 08/26/21 0435 08/27/21 0400 08/28/21 1941 08/29/21 0350 08/30/21  0500  WBC 7.5 8.3 9.2 9.6 9.2  HGB 9.7* 9.2* 9.3* 7.9* 7.8*  HCT 31.0* 29.4* 29.3* 24.9* 24.2*  MCV 94.5 94.2 93.9 93.6 94.2  PLT 163 178 206 201 322   Basic Metabolic Panel: Recent Labs  Lab 08/26/21 0435 08/27/21 0400 08/28/21 0633 08/29/21 0350 08/30/21 0500 08/31/21 0500  NA  --  144 141 132* 130* 130*  K  --  2.8* 3.0* 3.4* 3.9 4.3  CL  --  101 100 98 97* 97*  CO2  --  29 28 24 24 23   GLUCOSE  --  220* 151* 264* 257* 269*  BUN  --  76* 79* 79* 78* 80*  CREATININE  --  3.37* 3.88* 4.32* 4.60* 4.93*  CALCIUM  --  9.3 9.1 8.4* 8.6* 8.8*  MG 2.0 1.6* 2.4  --   --   --   PHOS  --  3.4  3.4 4.1 4.1 5.3* 6.2*   GFR: Estimated Creatinine Clearance: 10.3 mL/min (A) (by C-G formula based on SCr of 4.93 mg/dL (H)). Liver Function  Tests: Recent Labs  Lab 08/27/21 0400 08/28/21 0633 08/29/21 0350 08/30/21 0500 08/31/21 0500  ALBUMIN 2.3* 2.3* 2.2* 2.4* 2.4*   No results for input(s): LIPASE, AMYLASE in the last 168 hours. Recent Labs  Lab 08/24/21 1718  AMMONIA 25   Coagulation Profile: Recent Labs  Lab 08/26/21 1343  INR 1.2   Cardiac Enzymes: Recent Labs  Lab 08/27/21 0400  CKTOTAL 71   BNP (last 3 results) No results for input(s): PROBNP in the last 8760 hours. HbA1C: No results for input(s): HGBA1C in the last 72 hours. CBG: Recent Labs  Lab 08/30/21 0619 08/30/21 1155 08/30/21 1702 08/30/21 2126 08/31/21 0619  GLUCAP 227* 237* 173* 230* 229*   Lipid Profile: No results for input(s): CHOL, HDL, LDLCALC, TRIG, CHOLHDL, LDLDIRECT in the last 72 hours. Thyroid Function Tests: No results for input(s): TSH, T4TOTAL, FREET4, T3FREE, THYROIDAB in the last 72 hours. Anemia Panel: No results for input(s): VITAMINB12, FOLATE, FERRITIN, TIBC, IRON, RETICCTPCT in the last 72 hours.    Radiology Studies: I have reviewed all of the imaging during this hospital visit personally     Scheduled Meds:  allopurinol  100 mg Oral Daily   aspirin EC  81 mg Oral Daily   atorvastatin  80 mg Oral QHS   Chlorhexidine Gluconate Cloth  6 each Topical Daily   diltiazem  60 mg Oral Q8H   hydrALAZINE  25 mg Oral Q8H   insulin aspart  0-9 Units Subcutaneous TID WC   insulin detemir  15 Units Subcutaneous QHS   labetalol  200 mg Oral BID   lipase/protease/amylase  36,000-72,000 Units Oral See admin instructions   pantoprazole  40 mg Oral Daily   sodium chloride flush  3 mL Intravenous Q12H   Continuous Infusions:  sodium chloride 250 mL (08/26/21 1235)   sodium chloride 100 mL/hr at 08/31/21 0254     LOS: 14 days        Chrisha Vogel Gerome Apley, MD

## 2021-08-31 NOTE — Progress Notes (Signed)
The patient stated that at times she sees other people in her room that are not there. And she knows they are not real.  Informed on call physician.  Her complaints of headaches maybe related to her high blood pressures.  During the day it was SBP in the 110s and this evening it was up to 160s. Will continue to monitor.  Lupita Dawn, RN

## 2021-08-31 NOTE — Progress Notes (Signed)
Mobility Specialist: Progress Note   08/31/21 1253  Mobility  Activity Ambulated to bathroom;Ambulated in hall  Level of Assistance Minimal assist, patient does 75% or more  Assistive Device Front wheel walker  Distance Ambulated (ft) 150 ft  Mobility Ambulated with assistance in hallway  Mobility Response Tolerated well  Mobility performed by Mobility specialist  $Mobility charge 1 Mobility   Pt to BR and then agreeable to ambulation, void successful. Pt mod I to sit EOB and required minA to stand from EOB as well as the toilet. Pt laterally unsteady during ambulation leaning to her L on occasion, min guard to correct. Pt back to bed after walk with call bell and phone at her side. Bed alarm is on.   [facility] Rakim Moone Mobility Specialist Mobility Specialist Phone: [phone]

## 2021-08-31 NOTE — Progress Notes (Signed)
Upon entering pts room, pt asked if there was a woman and 2 children standing at her room door. She stated she seen them but didn't feel like they were really there. Noting continued hallucinations. MD notified.  Raelyn Number, RN

## 2021-08-31 NOTE — Plan of Care (Signed)
  Problem: Activity: Goal: Risk for activity intolerance will decrease Outcome: Progressing   Problem: Health Behavior/Discharge Planning: Goal: Ability to manage health-related needs will improve Outcome: Not Progressing

## 2021-08-31 NOTE — Progress Notes (Signed)
Nutrition Follow-up  DOCUMENTATION CODES:   Not applicable  INTERVENTION:   Glucerna Shake po TID, each supplement provides 220 kcal and 10 grams of protein. MVI with minerals daily.  NUTRITION DIAGNOSIS:   Inadequate oral intake related to decreased appetite as evidenced by per patient/family report.  Ongoing  GOAL:   Patient will meet greater than or equal to 90% of their needs  Progressing   MONITOR:   PO intake, Supplement acceptance, Labs, Skin  REASON FOR ASSESSMENT:   Malnutrition Screening Tool    ASSESSMENT:   70 yo female admitted to [facility] with blurry vision. Transferred to [facility] on 10/29 with uncontrolled HTN in the setting of AKI on CKD 3b. PMH includes Crohn's disease, HTN, DM-2, CVA.  Patient required HD on 10/23 and 10/25.  Currently on a heart healthy carb modified diet. Meal intakes: 25-100%, average 65% for the past 3 days.  Current weight 69.1 kg  Patient reports that she has been eating "so-so." Appetite and intake have improved, but are still not back to usual. She feels weak. She likes Glucerna Shakes and requests to receive them between meals.   Labs reviewed. Na 130, phos 6.2 CBG: 229-291  Medications reviewed and include Novolog, Levemir, Creon, Protonix.  Nutrition Focused Physical Exam:  Flowsheet Row Most Recent Value  Orbital Region No depletion  Upper Arm Region No depletion  Thoracic and Lumbar Region No depletion  Buccal Region No depletion  Temple Region No depletion  Clavicle Bone Region No depletion  Clavicle and Acromion Bone Region No depletion  Scapular Bone Region No depletion  Dorsal Hand Mild depletion  Patellar Region Mild depletion  Anterior Thigh Region Mild depletion  Posterior Calf Region Mild depletion  Edema (RD Assessment) None  Hair Reviewed  Eyes Reviewed  Mouth Reviewed  Skin Reviewed  Nails Reviewed        Diet Order:   Diet Order             Diet heart healthy/carb modified Room service  appropriate? Yes; Fluid consistency: Thin  Diet effective now                   EDUCATION NEEDS:   Not appropriate for education at this time  Skin:  Skin Assessment: Skin Integrity Issues: Skin Integrity Issues:: Stage I Stage I: buttocks  Last BM:  11/2  Height:   Ht Readings from Last 1 Encounters:  08/26/21 5\' 4"  (1.626 m)    Weight:   Wt Readings from Last 1 Encounters:  08/31/21 69.1 kg    BMI:  Body mass index is 26.15 kg/m.  Estimated Nutritional Needs:   Kcal:  1800-1900  Protein:  90-105 gm  Fluid:  >/= 1.8 L    Lucas Mallow, RD, LDN, CNSC Please refer to Amion for contact information.

## 2021-08-31 NOTE — Progress Notes (Signed)
Physical Therapy Treatment Patient Details Name: Heather [name] MRN: [id number] DOB: Jan 31, 1951 Today's Date: 08/31/2021   History of Present Illness Pt is a 70 y.o. female who presented 08/16/21 with blurred vision, HTN, and headache. Found to have altered mental status with AKI on CKD, needed dialysis on 10/23. EEG mild diffuse slowing, MRI on 10/19 no acute stroke, MRA left ACA stenosis, MRV negative. However on 10/30 MRI showed left cerebellum and left frontal small/punctate infarcts. S/p temporary dialysis central line placement R internal jugular 10/23. PMH: CKD 3, Crohn's disease, hypertension, diabetes mellitus, BPPV stroke    PT Comments    Pt making good progress with mobility, ambulating household distances this date. While pt denies any dizziness or lightheadedness today she does continue to display balance deficits, needing up to minA to prevent LOB when ambulating without an AD. Pt with slow, but steady gait when using the RW. Attempted to further challenge pt's dynamic gait balance through cuing pt to change head positions, no LOB but compensates for this challenge by slowing her gait speed. She continues to report having hallucinations. Will continue to follow acutely. Current recommendations remain appropriate.    Recommendations for follow up therapy are one component of a multi-disciplinary discharge planning process, led by the attending physician.  Recommendations may be updated based on patient status, additional functional criteria and insurance authorization.  Follow Up Recommendations  Home health PT     Assistance Recommended at Discharge Frequent or constant Supervision/Assistance  Equipment Recommendations  None recommended by PT (has necessary equipment)    Recommendations for Other Services       Precautions / Restrictions Precautions Precautions: Fall;Other (comment) Precaution Comments: monitor BP Restrictions Weight Bearing Restrictions: No      Mobility  Bed Mobility Overal bed mobility: Needs Assistance Bed Mobility: Supine to Sit;Sit to Supine     Supine to sit: Supervision;HOB elevated Sit to supine: Supervision;HOB elevated   General bed mobility comments: Extra time time, supervision for safety with HOB elevated.    Transfers Overall transfer level: Needs assistance Equipment used: Rolling walker (2 wheels) Transfers: Sit to/from Stand Sit to Stand: Min guard           General transfer comment: Sit to stand from EOB, good carryover of hand placement from prior session, min guard assist for safety, no LOB.    Ambulation/Gait Ambulation/Gait assistance: Min guard;Min assist Gait Distance (Feet): 100 Feet Assistive device: Rolling walker (2 wheels);None Gait Pattern/deviations: Step-through pattern;Decreased stride length;Narrow base of support Gait velocity: reduced Gait velocity interpretation: <1.31 ft/sec, indicative of household ambulator General Gait Details: Pt with slow, but steady gait with RW with min guard assist and no LOB. Gait slowed when pt was cued to change head positions to find different colored objects to challenge her dynamic gait balance, but no LOB. Increased instability with pt placing UEs in an almost high-guard position, cuing pt to obtain a more wide stance to improve balance, when ambulating without UE support. Ambulated without RW about half the gait distance, minA intermittently to prevent LOB.   Stairs             Wheelchair Mobility    Modified Rankin (Stroke Patients Only) Modified Rankin (Stroke Patients Only) Pre-Morbid Rankin Score: No symptoms Modified Rankin: Moderately severe disability     Balance Overall balance assessment: Needs assistance Sitting-balance support: No upper extremity supported;Feet supported Sitting balance-Leahy Scale: Good     Standing balance support: No upper extremity supported;Bilateral upper extremity supported;During functional  activity Standing balance-Leahy Scale: Fair Standing balance comment: Able to ambulate without UE support but displayed increased instability needing up to minA.                            Cognition Arousal/Alertness: Awake/alert Behavior During Therapy: WFL for tasks assessed/performed Overall Cognitive Status: Impaired/Different from baseline Area of Impairment: Awareness;Problem solving                           Awareness: Emergent Problem Solving: Slow processing General Comments: Pt continues to report hallucinations of people in room talking with her. Pt with slow processing and losing attention intermittently.        Exercises      General Comments General comments (skin integrity, edema, etc.): VSS on RA, denied any dizziness/lightheadedness      Pertinent Vitals/Pain Pain Assessment: Faces Faces Pain Scale: Hurts little more Pain Location: back Pain Descriptors / Indicators: Discomfort;Grimacing;Guarding Pain Intervention(s): Limited activity within patient's tolerance;Monitored during session;Repositioned;Patient requesting pain meds-RN notified;Heat applied    Home Living                          Prior Function            PT Goals (current goals can now be found in the care plan section) Acute Rehab PT Goals Patient Stated Goal: to get better to go home PT Goal Formulation: With patient Time For Goal Achievement: 09/13/21 Potential to Achieve Goals: Good Progress towards PT goals: Progressing toward goals    Frequency    Min 4X/week      PT Plan Current plan remains appropriate    Co-evaluation              AM-PAC PT "6 Clicks" Mobility   Outcome Measure  Help needed turning from your back to your side while in a flat bed without using bedrails?: A Little Help needed moving from lying on your back to sitting on the side of a flat bed without using bedrails?: A Little Help needed moving to and from a bed  to a chair (including a wheelchair)?: A Little Help needed standing up from a chair using your arms (e.g., wheelchair or bedside chair)?: A Little Help needed to walk in hospital room?: A Little Help needed climbing 3-5 steps with a railing? : A Little 6 Click Score: 18    End of Session Equipment Utilized During Treatment: Gait belt Activity Tolerance: Patient tolerated treatment well Patient left: with call bell/phone within reach;in bed;with bed alarm set Nurse Communication: Mobility status;Patient requests pain meds PT Visit Diagnosis: Unsteadiness on feet (R26.81);Other abnormalities of gait and mobility (R26.89);Muscle weakness (generalized) (M62.81);History of falling (Z91.81);Difficulty in walking, not elsewhere classified (R26.2);Dizziness and giddiness (R42)     Time: 1610-9604 PT Time Calculation (min) (ACUTE ONLY): 23 min  Charges:  $Gait Training: 8-22 mins $Neuromuscular Re-education: 8-22 mins                     Moishe Spice, PT, DPT Acute Rehabilitation Services  Pager: [phone] Office: [place] 08/31/2021, 5:37 PM

## 2021-08-31 NOTE — Progress Notes (Signed)
[facility] night cross cover note:  Notified by RN that patient is experiencing visual hallucinations of unclear chronicity, with the patient conveying that she is seeing people in her room that she knows are not truly present.  Additionally, patient experiencing headache that has been refractory to acetaminophen as well as as needed p.o. Dilaudid.  Per chart review, it appears that the patient has previously experienced similar headache during this hospitalization.  Considered order for Toradol, but refrained given additional chart review revealing acute kidney injury requiring temporary hemodialysis.  Noted existing orders for as needed Flexeril as well as as needed K pad to the neck and asked that both be administered at this time.    Babs Bertin, DO Hospitalist

## 2021-08-31 NOTE — Progress Notes (Signed)
Inpatient Diabetes Program Recommendations  AACE/ADA: New Consensus Statement on Inpatient Glycemic Control   Target Ranges:  Prepandial:   less than 140 mg/dL      Peak postprandial:   less than 180 mg/dL (1-2 hours)      Critically ill patients:  140 - 180 mg/dL   Results for [name], Heather A "[name]" (MRN [id number]) as of 08/31/2021 11:11  Ref. Range 08/30/2021 06:19 08/30/2021 11:55 08/30/2021 17:02 08/30/2021 21:26 08/31/2021 06:19  Glucose-Capillary Latest Ref Range: 70 - 99 mg/dL 227 (H) 237 (H) 173 (H) 230 (H) 229 (H)    Review of Glycemic Control  Diabetes history: DM2 Outpatient Diabetes medications: Levemir 70 units QHS, Humalog 20-26 units TID with meals Current orders for Inpatient glycemic control: Levemir 15 units QHS, Novolog 0-9 units TID with meals   Inpatient Diabetes Program Recommendations:     Insulin: Please consider increasing Levemir to 17 units QHS, adding Novolog 0-5 units QHS for bedtime correction, and adding Novolog 2 units TID with meals for meal coverage if patient eats at least 50% of meals.   Thanks, Heather Alderman, RN, MSN, CDE Diabetes Coordinator Inpatient Diabetes Program [phone] (Team Pager from 8am to 5pm)

## 2021-08-31 NOTE — Progress Notes (Signed)
[facility] ROUNDING NOTE   Subjective:   Mental status seems slightly worse today.  Does have some hallucinations that she expressed overnight likely related to delirium.  Urine output seems to be adequate but creatinine continues to rise.  Continues to have some fluctuating blood pressures.  Objective:  Vital signs in last 24 hours:  Temp:  [97.7 F (36.5 C)-98.6 F (37 C)] 97.8 F (36.6 C) (11/03 0828) Pulse Rate:  [64-87] 78 (11/03 0828) Resp:  [13-20] 15 (11/03 0828) BP: (113-168)/(43-66) 144/51 (11/03 0828) SpO2:  [95 %-100 %] 99 % (11/03 0828) Weight:  [69.1 kg] 69.1 kg (11/03 0331)  Weight change: -1.2 kg Filed Weights   08/29/21 0341 08/30/21 0600 08/31/21 0331  Weight: 68.6 kg 70.3 kg 69.1 kg    Intake/Output: I/O last 3 completed shifts: In: 2120 [P.O.:1820; IV Piggyback:300] Out: 250 [Urine:250]   Intake/Output this shift:  No intake/output data recorded. GEN: sitting in bed, no distress CVS-normal rate, no rub RS- no iwob, bilateral chest rise ABD- BS present soft non-distended EXT- no edema Neuro - awake, alert, oriented to person and place  Basic Metabolic Panel: Recent Labs  Lab 08/26/21 0435 08/27/21 0400 08/28/21 0633 08/29/21 0350 08/30/21 0500 08/31/21 0500  NA  --  144 141 132* 130* 130*  K  --  2.8* 3.0* 3.4* 3.9 4.3  CL  --  101 100 98 97* 97*  CO2  --  29 28 24 24 23   GLUCOSE  --  220* 151* 264* 257* 269*  BUN  --  76* 79* 79* 78* 80*  CREATININE  --  3.37* 3.88* 4.32* 4.60* 4.93*  CALCIUM  --  9.3 9.1 8.4* 8.6* 8.8*  MG 2.0 1.6* 2.4  --   --   --   PHOS  --  3.4  3.4 4.1 4.1 5.3* 6.2*    Liver Function Tests: Recent Labs  Lab 08/27/21 0400 08/28/21 0633 08/29/21 0350 08/30/21 0500 08/31/21 0500  ALBUMIN 2.3* 2.3* 2.2* 2.4* 2.4*   No results for input(s): LIPASE, AMYLASE in the last 168 hours. Recent Labs  Lab 08/24/21 1718  AMMONIA 25    CBC: Recent Labs  Lab 08/26/21 0435 08/27/21 0400  08/28/21 0633 08/29/21 0350 08/30/21 0500  WBC 7.5 8.3 9.2 9.6 9.2  HGB 9.7* 9.2* 9.3* 7.9* 7.8*  HCT 31.0* 29.4* 29.3* 24.9* 24.2*  MCV 94.5 94.2 93.9 93.6 94.2  PLT 163 178 206 201 199    Cardiac Enzymes: Recent Labs  Lab 08/27/21 0400  CKTOTAL 71    BNP: Invalid input(s): POCBNP  CBG: Recent Labs  Lab 08/30/21 0619 08/30/21 1155 08/30/21 1702 08/30/21 2126 08/31/21 0619  GLUCAP 227* 237* 173* 230* 229*    Microbiology: Results for orders placed or performed during the hospital encounter of 08/16/21  Resp Panel by RT-PCR (Flu A&B, Covid) Nasopharyngeal Swab     Status: None   Collection Time: 08/16/21  8:28 PM   Specimen: Nasopharyngeal Swab; Nasopharyngeal(NP) swabs in vial transport medium  Result Value Ref Range Status   SARS Coronavirus 2 by RT PCR NEGATIVE NEGATIVE Final    Comment: (NOTE) SARS-CoV-2 target nucleic acids are NOT DETECTED.  The SARS-CoV-2 RNA is generally detectable in upper respiratory specimens during the acute phase of infection. The lowest concentration of SARS-CoV-2 viral copies this assay can detect is 138 copies/mL. A negative result does not preclude SARS-Cov-2 infection and should not be used as the sole basis for treatment or other patient management decisions.  A negative result may occur with  improper specimen collection/handling, submission of specimen other than nasopharyngeal swab, presence of viral mutation(s) within the areas targeted by this assay, and inadequate number of viral copies(<138 copies/mL). A negative result must be combined with clinical observations, patient history, and epidemiological information. The expected result is Negative.  Fact Sheet for Patients:  EntrepreneurPulse.com.au  Fact Sheet for Healthcare Providers:  IncredibleEmployment.be  This test is no t yet approved or cleared by the [place] FDA and  has been authorized for detection and/or diagnosis  of SARS-CoV-2 by FDA under an Emergency Use Authorization (EUA). This EUA will remain  in effect (meaning this test can be used) for the duration of the COVID-19 declaration under Section 564(b)(1) of the Act, 21 U.S.C.section 360bbb-3(b)(1), unless the authorization is terminated  or revoked sooner.       Influenza A by PCR NEGATIVE NEGATIVE Final   Influenza B by PCR NEGATIVE NEGATIVE Final    Comment: (NOTE) The Xpert Xpress SARS-CoV-2/FLU/RSV plus assay is intended as an aid in the diagnosis of influenza from Nasopharyngeal swab specimens and should not be used as a sole basis for treatment. Nasal washings and aspirates are unacceptable for Xpert Xpress SARS-CoV-2/FLU/RSV testing.  Fact Sheet for Patients: EntrepreneurPulse.com.au  Fact Sheet for Healthcare Providers: IncredibleEmployment.be  This test is not yet approved or cleared by the [place] FDA and has been authorized for detection and/or diagnosis of SARS-CoV-2 by FDA under an Emergency Use Authorization (EUA). This EUA will remain in effect (meaning this test can be used) for the duration of the COVID-19 declaration under Section 564(b)(1) of the Act, 21 U.S.C. section 360bbb-3(b)(1), unless the authorization is terminated or revoked.  Performed at [facility], [address] [place], [place] [address]   Urine Culture     Status: None   Collection Time: 08/17/21 11:00 AM   Specimen: Urine, Clean Catch  Result Value Ref Range Status   Specimen Description   Final    URINE, CLEAN CATCH Performed at [facility], [address] [place], [place] [address]    Special Requests   Final    NONE Performed at [facility], [address] [place], [place] [address]    Culture   Final    NO GROWTH Performed at [facility], [place] [address], [place], [place] [address]    Report Status 08/19/2021 FINAL  Final  Culture, blood (Routine X 2) w Reflex to ID Panel     Status:  None   Collection Time: 08/19/21  7:28 AM   Specimen: BLOOD LEFT ARM  Result Value Ref Range Status   Specimen Description BLOOD LEFT ARM BOTTLES DRAWN AEROBIC AND ANAEROBIC  Final   Special Requests   Final    Blood Culture results may not be optimal due to an excessive volume of blood received in culture bottles   Culture   Final    NO GROWTH 5 DAYS Performed at [facility], [address], [place], [place] [address]    Report Status 08/24/2021 FINAL  Final  Culture, blood (Routine X 2) w Reflex to ID Panel     Status: None   Collection Time: 08/19/21  8:27 AM   Specimen: BLOOD LEFT HAND  Result Value Ref Range Status   Specimen Description   Final    BLOOD LEFT HAND BOTTLES DRAWN AEROBIC AND ANAEROBIC   Special Requests Blood Culture adequate volume  Final   Culture   Final  NO GROWTH 5 DAYS Performed at [facility], [address] [place], [place] [address]    Report Status 08/24/2021 FINAL  Final  MRSA Next Gen by PCR, Nasal     Status: Abnormal   Collection Time: 08/21/21  5:40 PM   Specimen: Nasal Mucosa; Nasal Swab  Result Value Ref Range Status   MRSA by PCR Next Gen DETECTED (A) NOT DETECTED Final    Comment: RESULT CALLED TO, READ BACK BY AND VERIFIED WITH: JESSICA HEARN RN,2021,08/21/2021, SELF S. (NOTE) The GeneXpert MRSA Assay (FDA approved for NASAL specimens only), is one component of a comprehensive MRSA colonization surveillance program. It is not intended to diagnose MRSA infection nor to guide or monitor treatment for MRSA infections. Test performance is not FDA approved in patients less than 66 years old. Performed at [facility], [address], [place], [name] [address]   MRSA Next Gen by PCR, Nasal     Status: Abnormal   Collection Time: 08/26/21 12:49 AM   Specimen: Nasal Mucosa; Nasal Swab  Result Value Ref Range Status   MRSA by PCR Next Gen DETECTED (A) NOT DETECTED Final    Comment: RESULT CALLED TO, READ BACK BY AND VERIFIED WITH: RN  DAVID S.  [id number] FCP (NOTE) The GeneXpert MRSA Assay (FDA approved for NASAL specimens only), is one component of a comprehensive MRSA colonization surveillance program. It is not intended to diagnose MRSA infection nor to guide or monitor treatment for MRSA infections. Test performance is not FDA approved in patients less than 68 years old. Performed at [facility], [place] [address] [place], [place] [address]   Respiratory (~20 pathogens) panel by PCR     Status: None   Collection Time: 08/26/21  9:31 AM   Specimen: Nasopharyngeal Swab; Respiratory  Result Value Ref Range Status   Adenovirus NOT DETECTED NOT DETECTED Final   Coronavirus 229E NOT DETECTED NOT DETECTED Final    Comment: (NOTE) The Coronavirus on the Respiratory Panel, DOES NOT test for the novel  Coronavirus (2019 nCoV)    Coronavirus HKU1 NOT DETECTED NOT DETECTED Final   Coronavirus NL63 NOT DETECTED NOT DETECTED Final   Coronavirus OC43 NOT DETECTED NOT DETECTED Final   Metapneumovirus NOT DETECTED NOT DETECTED Final   Rhinovirus / Enterovirus NOT DETECTED NOT DETECTED Final   Influenza A NOT DETECTED NOT DETECTED Final   Influenza B NOT DETECTED NOT DETECTED Final   Parainfluenza Virus 1 NOT DETECTED NOT DETECTED Final   Parainfluenza Virus 2 NOT DETECTED NOT DETECTED Final   Parainfluenza Virus 3 NOT DETECTED NOT DETECTED Final   Parainfluenza Virus 4 NOT DETECTED NOT DETECTED Final   Respiratory Syncytial Virus NOT DETECTED NOT DETECTED Final   Bordetella pertussis NOT DETECTED NOT DETECTED Final   Bordetella Parapertussis NOT DETECTED NOT DETECTED Final   Chlamydophila pneumoniae NOT DETECTED NOT DETECTED Final   Mycoplasma pneumoniae NOT DETECTED NOT DETECTED Final    Comment: Performed at [facility], [place]. [address], [place], [place] [address]    Coagulation Studies: No results for input(s): LABPROT, INR in the last 72 hours.   Urinalysis: Recent Labs    08/29/21 1030   COLORURINE YELLOW  LABSPEC 1.013  PHURINE 5.0  GLUCOSEU NEGATIVE  HGBUR SMALL*  BILIRUBINUR NEGATIVE  KETONESUR NEGATIVE  PROTEINUR 100*  NITRITE NEGATIVE  LEUKOCYTESUR NEGATIVE      Imaging: No results found.   Medications:    sodium chloride 250 mL (08/26/21 1235)  sodium chloride 100 mL/hr at 08/31/21 0102    allopurinol  100 mg Oral Daily   aspirin EC  81 mg Oral Daily   atorvastatin  80 mg Oral QHS   Chlorhexidine Gluconate Cloth  6 each Topical Daily   diltiazem  60 mg Oral Q8H   hydrALAZINE  25 mg Oral Q8H   insulin aspart  0-9 Units Subcutaneous TID WC   insulin detemir  15 Units Subcutaneous QHS   labetalol  200 mg Oral BID   lipase/protease/amylase  36,000-72,000 Units Oral See admin instructions   pantoprazole  40 mg Oral Daily   sodium chloride flush  3 mL Intravenous Q12H   sodium chloride, acetaminophen, cyclobenzaprine, diphenhydrAMINE, diphenoxylate-atropine, haloperidol lactate, hydrALAZINE, HYDROmorphone, ipratropium-albuterol, LORazepam, meclizine, ondansetron **OR** ondansetron (ZOFRAN) IV, sodium chloride flush, temazepam  Assessment/ Plan:  AKI/CKD stage IIIb - in setting of PNA and hypertensive urgency and chronic diarrhea.  Baseline Scr variable 1.6-2.2 and follows Dr. Theador Hawthorne as an outpatient. Required HD 10/25 due to hyperkalemia with UF of 2 liters.   Serologies negative    Urine output was noted to increase but not well documented. Does not appear volume overloaded.  Tried gentle hydration on 10/31 without much effect Creatinine continues to slowly uptrend to 4.9 today and no obvious signs of uremia.  I think this is mostly hemodynamic with adjustments related to blood pressure with no obvious concerns on urinalysis and no obvious signs of retention.  Really cannot find anything else that could be causing this.  I will try hydration again today with IV fluids and also will decrease her hydralazine to limit fluctuations in blood pressure.  May  want to have her blood pressure run a little bit higher for the next few days. Maintain temp HD catheter for now but if continued improvement in coming days will d/c Acute hypoxic respiratory failure - due to multifocal pneumonia vs CHF.  Currently on Abx per primary.   last dialysis 10/25 with 2 L removed. Negative balance noted . V/Q negative   HTN urgency/emergency -much improved at this time.  Continue current medications.  Decrease medications if systolic is consistently lower than 120 AMS  much improved; multifactorial Anemia of critical illness - s/p transfusion.  Continue to follow H/H.  Thrombocytopenia -now normalized.  Possibly was related to hypertension    LOS: 14

## 2021-08-31 NOTE — Care Management Important Message (Signed)
Important Message  Patient Details  Name: Heather [name] MRN: [id number] Date of Birth: 26-Mar-1951   Medicare Important Message Given:  Yes     Shelda Altes 08/31/2021, 10:45 AM

## 2021-09-01 DIAGNOSIS — E1121 Type 2 diabetes mellitus with diabetic nephropathy: Secondary | ICD-10-CM

## 2021-09-01 LAB — RENAL FUNCTION PANEL
Albumin: 2.4 g/dL — ABNORMAL LOW (ref 3.5–5.0)
Anion gap: 9 (ref 5–15)
BUN: 77 mg/dL — ABNORMAL HIGH (ref 8–23)
CO2: 22 mmol/L (ref 22–32)
Calcium: 8.7 mg/dL — ABNORMAL LOW (ref 8.9–10.3)
Chloride: 100 mmol/L (ref 98–111)
Creatinine, Ser: 4.61 mg/dL — ABNORMAL HIGH (ref 0.44–1.00)
GFR, Estimated: 10 mL/min — ABNORMAL LOW (ref >60)
Glucose, Bld: 157 mg/dL — ABNORMAL HIGH (ref 70–99)
Phosphorus: 6.3 mg/dL — ABNORMAL HIGH (ref 2.5–4.6)
Potassium: 4.7 mmol/L (ref 3.5–5.1)
Sodium: 131 mmol/L — ABNORMAL LOW (ref 135–145)

## 2021-09-01 LAB — GLUCOSE, CAPILLARY
Glucose-Capillary: 143 mg/dL — ABNORMAL HIGH (ref 70–99)
Glucose-Capillary: 164 mg/dL — ABNORMAL HIGH (ref 70–99)
Glucose-Capillary: 152 mg/dL — ABNORMAL HIGH (ref 70–99)
Glucose-Capillary: 241 mg/dL — ABNORMAL HIGH (ref 70–99)

## 2021-09-01 NOTE — Progress Notes (Signed)
Mobility Specialist: Progress Note   09/01/21 1130  Mobility  Activity Ambulated in room;Transferred:  Chair to bed  Level of Assistance Contact guard assist, steadying assist  Assistive Device  (HHA)  Distance Ambulated (ft) 15 ft  Mobility Ambulated with assistance in room  Mobility Response Tolerated well  Mobility performed by Mobility specialist  $Mobility charge 1 Mobility   Pt differed hallway ambulation but requesting to transfer back to bed. Pt assisted back to bed. Will f/u later today for ambulation.   [facility] Barbera Perritt Mobility Specialist Mobility Specialist Phone: [phone]

## 2021-09-01 NOTE — Progress Notes (Addendum)
Physical Therapy Treatment Patient Details Name: Heather [name] MRN: [id number] DOB: October 25, 1951 Today's Date: 09/01/2021   History of Present Illness Pt is a 70 y.o. female who presented 08/16/21 with blurred vision, HTN, and headache. Found to have altered mental status with AKI on CKD, needed dialysis on 10/23. EEG mild diffuse slowing, MRI on 10/19 no acute stroke, MRA left ACA stenosis, MRV negative. However on 10/30 MRI showed left cerebellum and left frontal small/punctate infarcts. S/p temporary dialysis central line placement R internal jugular 10/23. PMH: CKD 3, Crohn's disease, hypertension, diabetes mellitus, BPPV stroke    PT Comments    Pt reporting feeling lightheaded/dizzy again today and having some hallucinations again earlier in the day. Focused session addressing dizziness/lightheadedness as this would impact her balance and her risk for falls. Pt did report lightheadedness with changing positions, but BP remained stable see below.  However, pt reported dizziness/spinning when changing head positions, but reported the dizziness/spinning with changing head positions is different than the lightheaded sensations with changing body positions. Performed [employer] test bil, with pt reporting spinning sensation bil but more so on the R (nystagmus noted on R but not evident on L). However, pt with infarct in posterior aspect of brain per MRI on 10/30 that is the likely explanation of her dizzy sensations. Will continue to follow acutely. Current recommendations remain appropriate.  BP:  129/44 supine 144/59 sitting EOB 142/51 standing (needed to sit before could stand 3 min due to feeling lightheaded)     Recommendations for follow up therapy are one component of a multi-disciplinary discharge planning process, led by the attending physician.  Recommendations may be updated based on patient status, additional functional criteria and insurance authorization.  Follow Up  Recommendations  Home health PT     Assistance Recommended at Discharge Frequent or constant Supervision/Assistance  Equipment Recommendations  None recommended by PT (has necessary equipment)    Recommendations for Other Services       Precautions / Restrictions Precautions Precautions: Fall;Other (comment) Precaution Comments: monitor BP Restrictions Weight Bearing Restrictions: No     Mobility  Bed Mobility Overal bed mobility: Needs Assistance Bed Mobility: Supine to Sit;Sit to Supine     Supine to sit: Supervision Sit to supine: Supervision   General bed mobility comments: Supine <> sit L EOB with HOB elevated and use of rails, extra time. Supine <> sit sideways in bed for [employer] test multiple times. Supervision for safety.    Transfers Overall transfer level: Needs assistance Equipment used: Rolling walker (2 wheels) Transfers: Sit to/from Stand Sit to Stand: Supervision           General transfer comment: Sit to stand from EOB, good carryover of hand placement from prior session, supervision for safety, no LOB.    Ambulation/Gait             General Gait Details: Deferred to focus on vestibular testing and taking orthostatics.   Stairs             Wheelchair Mobility    Modified Rankin (Stroke Patients Only) Modified Rankin (Stroke Patients Only) Pre-Morbid Rankin Score: No symptoms Modified Rankin: Moderately severe disability     Balance Overall balance assessment: Needs assistance Sitting-balance support: No upper extremity supported;Feet supported Sitting balance-Leahy Scale: Good     Standing balance support: Reliant on assistive device for balance Standing balance-Leahy Scale: Poor Standing balance comment: UE support on RW.  Cognition Arousal/Alertness: Awake/alert Behavior During Therapy: WFL for tasks assessed/performed                                    General Comments: For tasks performed today, she appeared [facility]. However, did not get to really challenge her upright mobility and cognition due to focusing on vestibular and orthostatics testing.        Exercises Other Exercises Other Exercises: Epley maneuver to R performed in case not related to infarct in posterior aspect of brain, poor tolerance noted by pt    General Comments General comments (skin integrity, edema, etc.): BP: 129/44 supine, 144/59 sitting EOB, 142/51 standing (needed to sit before could stand 3 min due to feeling lightheaded); Marye Round with noted nystagmus and symptoms to the R and some symptoms (did not detect nystagmus) to the L but less so than on R; reports dizziness/spinning with changing head positions is different than the lightheaded sensations with changing body positions      Pertinent Vitals/Pain Pain Assessment: Faces Faces Pain Scale: No hurt Pain Intervention(s): Monitored during session    Home Living                          Prior Function            PT Goals (current goals can now be found in the care plan section) Acute Rehab PT Goals Patient Stated Goal: to get better to go home PT Goal Formulation: With patient Time For Goal Achievement: 09/13/21 Potential to Achieve Goals: Good Progress towards PT goals: Progressing toward goals    Frequency    Min 4X/week      PT Plan Current plan remains appropriate    Co-evaluation              AM-PAC PT "6 Clicks" Mobility   Outcome Measure  Help needed turning from your back to your side while in a flat bed without using bedrails?: A Little Help needed moving from lying on your back to sitting on the side of a flat bed without using bedrails?: A Little Help needed moving to and from a bed to a chair (including a wheelchair)?: A Little Help needed standing up from a chair using your arms (e.g., wheelchair or bedside chair)?: A Little Help needed to walk in hospital  room?: A Little Help needed climbing 3-5 steps with a railing? : A Little 6 Click Score: 18    End of Session   Activity Tolerance: Other (comment) (limited by dizziness) Patient left: with call bell/phone within reach;in bed;with bed alarm set;with nursing/sitter in room Nurse Communication: Other (comment) (dizziness) PT Visit Diagnosis: Unsteadiness on feet (R26.81);Other abnormalities of gait and mobility (R26.89);Muscle weakness (generalized) (M62.81);History of falling (Z91.81);Difficulty in walking, not elsewhere classified (R26.2);Dizziness and giddiness (R42)     Time: 2542-7062 PT Time Calculation (min) (ACUTE ONLY): 19 min  Charges:  $Therapeutic Activity: 8-22 mins                     Moishe Spice, PT, DPT Acute Rehabilitation Services  Pager: [phone] Office: [place] 09/01/2021, 5:06 PM

## 2021-09-01 NOTE — Progress Notes (Signed)
[facility]  PROGRESS NOTE  Heather [name] PZW:[id number] DOB: [birth year] DOA: 08/16/2021 PCP: [name], MD   Brief HPI:   70 year old female with past medical history of CKD stage IIb, Crohn's disease, hypertension, diabetes mellitus type 2, CVA presented with blurry vision.  Patient reported 2 days of blurry vision with stabbing headache, nausea vomiting.  She came to ED for further evaluation. Brain MRI/MRA showed no acute changes. Further work-up with CT chest showed multifocal pneumonia, bilateral pleural effusion, pulmonary edema.  Patient started on IV antibiotics and also received furosemide. She had worsening renal function requiring renal replacement therapy x210/23 and 10/25  Patient was treated with ceftriaxone and vancomycin for atypical infection, before that she was on cefepime and linezolid. After 9 days of therapy her antibiotics have been discontinued.    Her renal function still not stable. Pending removal of HD catheter before she can go home with home health services.     Subjective   Patient seen and examined, denies any complaints.   Assessment/Plan:    Acute hypoxemic respiratory failure -Due to multifocal pneumonia/pulmonary edema -Completed antibiotic therapy -No longer requiring oxygen, O2 sats 98% on room air   Acute kidney injury on CKD stage IIIb -Likely in setting of pneumonia, hypertensive urgency and chronic diarrhea -Required hemodialysis on 10/23 and 10/25 -Serum creatinine 4.61 today -Still has temporary HD catheter in place -Nephrology plans to remove catheter if creatinine improves tomorrow  Metabolic encephalopathy -Resolved  Diabetes mellitus type 2 -CBG well controlled -Continue sliding scale insulin with NovoLog  Crohn's disease -At home on immunosuppression with Humira  Acute on chronic anemia -Hemoglobin stable, s/p units PRBC  Hypertension -Blood pressure well controlled -Continue diltiazem, hydralazine,  labetalol    Scheduled medications:    allopurinol  100 mg Oral Daily   aspirin EC  81 mg Oral Daily   atorvastatin  80 mg Oral QHS   Chlorhexidine Gluconate Cloth  6 each Topical Daily   diltiazem  60 mg Oral Q8H   feeding supplement (GLUCERNA SHAKE)  237 mL Oral TID BM   heparin injection (subcutaneous)  5,000 Units Subcutaneous Q8H   insulin aspart  0-5 Units Subcutaneous QHS   insulin aspart  0-9 Units Subcutaneous TID WC   insulin aspart  2 Units Subcutaneous TID WC   insulin detemir  17 Units Subcutaneous QHS   labetalol  200 mg Oral BID   lipase/protease/amylase  36,000-72,000 Units Oral See admin instructions   multivitamin with minerals  1 tablet Oral Daily   pantoprazole  40 mg Oral Daily   sodium chloride flush  3 mL Intravenous Q12H     Data Reviewed:   CBG:  Recent Labs  Lab 08/31/21 1615 08/31/21 2038 09/01/21 0629 09/01/21 1245 09/01/21 1643  GLUCAP 182* 83 143* 164* 152*    SpO2: 98 % O2 Flow Rate (L/min): 3 L/min    Vitals:   09/01/21 0828 09/01/21 1250 09/01/21 1632 09/01/21 1957  BP: (!) 140/46 (!) 143/48 (!) 142/51   Pulse: 73 73 80   Resp: 20 20 20 19   Temp: 98.2 F (36.8 C) 97.9 F (36.6 C) 98.2 F (36.8 C) 98.1 F (36.7 C)  TempSrc: Oral Oral Oral Oral  SpO2: 97% 97% 98%   Weight:      Height:         Intake/Output Summary (Last 24 hours) at 09/01/2021 2015 Last data filed at 09/01/2021 1251 Gross per 24 hour  Intake 240 ml  Output 350 ml  Net -110 ml    11/03 0701 - 11/04 1900 In: 960 [P.O.:960] Out: 850 [Urine:850]  Filed Weights   08/30/21 0600 08/31/21 0331 09/01/21 0343  Weight: 70.3 kg 69.1 kg 70.2 kg    Data Reviewed: Basic Metabolic Panel: Recent Labs  Lab 08/26/21 0435 08/27/21 0400 08/28/21 0633 08/29/21 0350 08/30/21 0500 08/31/21 0500 09/01/21 0325  NA  --  144 141 132* 130* 130* 131*  K  --  2.8* 3.0* 3.4* 3.9 4.3 4.7  CL  --  101 100 98 97* 97* 100  CO2  --  29 28 24 24 23 22   GLUCOSE  --   220* 151* 264* 257* 269* 157*  BUN  --  76* 79* 79* 78* 80* 77*  CREATININE  --  3.37* 3.88* 4.32* 4.60* 4.93* 4.61*  CALCIUM  --  9.3 9.1 8.4* 8.6* 8.8* 8.7*  MG 2.0 1.6* 2.4  --   --   --   --   PHOS  --  3.4  3.4 4.1 4.1 5.3* 6.2* 6.3*   Liver Function Tests: Recent Labs  Lab 08/28/21 0633 08/29/21 0350 08/30/21 0500 08/31/21 0500 09/01/21 0325  ALBUMIN 2.3* 2.2* 2.4* 2.4* 2.4*   No results for input(s): LIPASE, AMYLASE in the last 168 hours. No results for input(s): AMMONIA in the last 168 hours. CBC: Recent Labs  Lab 08/26/21 0435 08/27/21 0400 08/28/21 0633 08/29/21 0350 08/30/21 0500  WBC 7.5 8.3 9.2 9.6 9.2  HGB 9.7* 9.2* 9.3* 7.9* 7.8*  HCT 31.0* 29.4* 29.3* 24.9* 24.2*  MCV 94.5 94.2 93.9 93.6 94.2  PLT 163 178 206 201 199   Cardiac Enzymes: Recent Labs  Lab 08/27/21 0400  CKTOTAL 71   BNP (last 3 results) Recent Labs    08/19/21 0728  BNP 430.0*    ProBNP (last 3 results) No results for input(s): PROBNP in the last 8760 hours.  CBG: Recent Labs  Lab 08/31/21 1615 08/31/21 2038 09/01/21 0629 09/01/21 1245 09/01/21 1643  GLUCAP 182* 83 143* 164* 152*       Radiology Reports  No results found.     Antibiotics: Anti-infectives (From admission, onward)    Start     Dose/Rate Route Frequency Ordered Stop   08/28/21 1419  vancomycin variable dose per unstable renal function (pharmacist dosing)  Status:  Discontinued         Does not apply See admin instructions 08/28/21 1419 08/30/21 1442   08/26/21 1515  vancomycin (VANCOREADY) IVPB 1250 mg/250 mL        1,250 mg 166.7 mL/hr over 90 Minutes Intravenous  Once 08/26/21 1427 08/26/21 1913   08/26/21 1200  ceFEPIme (MAXIPIME) 2 g in sodium chloride 0.9 % 100 mL IVPB  Status:  Discontinued        2 g 200 mL/hr over 30 Minutes Intravenous Every 24 hours 08/25/21 1710 08/26/21 0811   08/26/21 0900  cefTRIAXone (ROCEPHIN) 2 g in sodium chloride 0.9 % 100 mL IVPB  Status:  Discontinued         2 g 200 mL/hr over 30 Minutes Intravenous Every 24 hours 08/26/21 0811 08/30/21 1442   08/23/21 1000  linezolid (ZYVOX) tablet 600 mg  Status:  Discontinued        600 mg Oral Every 12 hours 08/22/21 1038 08/26/21 0959   08/23/21 0900  ceFEPIme (MAXIPIME) 2 g in sodium chloride 0.9 % 100 mL IVPB        2 g 200 mL/hr over 30 Minutes  Intravenous Every 24 hours 08/23/21 0736 08/25/21 1900   08/19/21 0745  ceFEPIme (MAXIPIME) 2 g in sodium chloride 0.9 % 100 mL IVPB  Status:  Discontinued        2 g 200 mL/hr over 30 Minutes Intravenous Every 24 hours 08/19/21 0651 08/23/21 0736   08/19/21 0745  vancomycin (VANCOREADY) IVPB 750 mg/150 mL  Status:  Discontinued        750 mg 150 mL/hr over 60 Minutes Intravenous Every 48 hours 08/19/21 0651 08/22/21 1038   08/18/21 0945  fluconazole (DIFLUCAN) tablet 150 mg        150 mg Oral  Once 08/18/21 0853 08/18/21 0951         DVT prophylaxis: Heparin  Code Status: DNR  Family Communication: No family at bedside   Consultants: Nephrology  Procedures:     Objective    Physical Examination:   General-appears in no acute distress Heart-S1-S2, regular, no murmur auscultated Lungs-clear to auscultation bilaterally, no wheezing or crackles auscultated Abdomen-soft, nontender, no organomegaly Extremities-no edema in the lower extremities Neuro-alert, oriented x3, no focal deficit noted  Status is: Inpatient  Dispo: The patient is from: Home              Anticipated d/c is to: Home              Anticipated d/c date is: 09/04/2021              Patient currently not stable for discharge  Barrier to discharge-none  COVID-19 Labs  No results for input(s): DDIMER, FERRITIN, LDH, CRP in the last 72 hours.  Lab Results  Component Value Date   SARSCOV2NAA NEGATIVE 08/16/2021   [place] NEGATIVE 01/06/2021   [place] NEGATIVE 11/04/2020   [place] NEGATIVE 05/16/2020     Pressure Injury 08/26/21 Buttocks Right;Left  Stage 1 -  Intact skin with non-blanchable redness of a localized area usually over a bony prominence. (Active)  08/26/21 0050  Location: Buttocks  Location Orientation: Right;Left  Staging: Stage 1 -  Intact skin with non-blanchable redness of a localized area usually over a bony prominence.  Wound Description (Comments):   Present on Admission: Yes        Recent Results (from the past 240 hour(s))  MRSA Next Gen by PCR, Nasal     Status: Abnormal   Collection Time: 08/26/21 12:49 AM   Specimen: Nasal Mucosa; Nasal Swab  Result Value Ref Range Status   MRSA by PCR Next Gen DETECTED (A) NOT DETECTED Final    Comment: RESULT CALLED TO, READ BACK BY AND VERIFIED WITH: RN DAVID S.  [id number] FCP (NOTE) The GeneXpert MRSA Assay (FDA approved for NASAL specimens only), is one component of a comprehensive MRSA colonization surveillance program. It is not intended to diagnose MRSA infection nor to guide or monitor treatment for MRSA infections. Test performance is not FDA approved in patients less than 49 years old. Performed at [facility], [place] [address] [place], [place] [address]   Respiratory (~20 pathogens) panel by PCR     Status: None   Collection Time: 08/26/21  9:31 AM   Specimen: Nasopharyngeal Swab; Respiratory  Result Value Ref Range Status   Adenovirus NOT DETECTED NOT DETECTED Final   Coronavirus 229E NOT DETECTED NOT DETECTED Final    Comment: (NOTE) The Coronavirus on the Respiratory Panel, DOES NOT test for the novel  Coronavirus (2019 nCoV)    Coronavirus HKU1 NOT DETECTED NOT DETECTED Final  Coronavirus NL63 NOT DETECTED NOT DETECTED Final   Coronavirus OC43 NOT DETECTED NOT DETECTED Final   Metapneumovirus NOT DETECTED NOT DETECTED Final   Rhinovirus / Enterovirus NOT DETECTED NOT DETECTED Final   Influenza A NOT DETECTED NOT DETECTED Final   Influenza B NOT DETECTED NOT DETECTED Final   Parainfluenza Virus 1 NOT DETECTED NOT DETECTED Final    Parainfluenza Virus 2 NOT DETECTED NOT DETECTED Final   Parainfluenza Virus 3 NOT DETECTED NOT DETECTED Final   Parainfluenza Virus 4 NOT DETECTED NOT DETECTED Final   Respiratory Syncytial Virus NOT DETECTED NOT DETECTED Final   Bordetella pertussis NOT DETECTED NOT DETECTED Final   Bordetella Parapertussis NOT DETECTED NOT DETECTED Final   Chlamydophila pneumoniae NOT DETECTED NOT DETECTED Final   Mycoplasma pneumoniae NOT DETECTED NOT DETECTED Final    Comment: Performed at [facility], [place] [address], [place], [place] [address]    [facility] If 7PM-7AM, please contact night-coverage at [email or web address], Office  [phone]   09/01/2021, 8:15 PM  LOS: 15 days

## 2021-09-01 NOTE — Progress Notes (Signed)
[facility] ROUNDING NOTE   Subjective:   Patient seems stable today.  Denies any specific complaints.  Blood pressure continues to fluctuate.  Kidney function improving.  Objective:  Vital signs in last 24 hours:  Temp:  [97.7 F (36.5 C)-98.2 F (36.8 C)] 98.2 F (36.8 C) (11/04 0828) Pulse Rate:  [70-77] 73 (11/04 0828) Resp:  [18-20] 20 (11/04 0828) BP: (113-160)/(46-89) 140/46 (11/04 0828) SpO2:  [97 %-98 %] 97 % (11/04 0828) Weight:  [70.2 kg] 70.2 kg (11/04 0343)  Weight change: 1.1 kg Filed Weights   08/30/21 0600 08/31/21 0331 09/01/21 0343  Weight: 70.3 kg 69.1 kg 70.2 kg    Intake/Output: I/O last 3 completed shifts: In: 1020 [P.O.:1020] Out: 1000 [Urine:1000]   Intake/Output this shift:  No intake/output data recorded. GEN: sitting in bed, no distress CVS-normal rate, no rub RS- no iwob, bilateral chest rise ABD- BS present soft non-distended EXT- no edema Neuro - awake, alert, oriented to person and place  Basic Metabolic Panel: Recent Labs  Lab 08/26/21 0435 08/27/21 0400 08/28/21 0633 08/29/21 0350 08/30/21 0500 08/31/21 0500 09/01/21 0325  NA  --  144 141 132* 130* 130* 131*  K  --  2.8* 3.0* 3.4* 3.9 4.3 4.7  CL  --  101 100 98 97* 97* 100  CO2  --  29 28 24 24 23 22   GLUCOSE  --  220* 151* 264* 257* 269* 157*  BUN  --  76* 79* 79* 78* 80* 77*  CREATININE  --  3.37* 3.88* 4.32* 4.60* 4.93* 4.61*  CALCIUM  --  9.3 9.1 8.4* 8.6* 8.8* 8.7*  MG 2.0 1.6* 2.4  --   --   --   --   PHOS  --  3.4  3.4 4.1 4.1 5.3* 6.2* 6.3*    Liver Function Tests: Recent Labs  Lab 08/28/21 0633 08/29/21 0350 08/30/21 0500 08/31/21 0500 09/01/21 0325  ALBUMIN 2.3* 2.2* 2.4* 2.4* 2.4*   No results for input(s): LIPASE, AMYLASE in the last 168 hours. No results for input(s): AMMONIA in the last 168 hours.   CBC: Recent Labs  Lab 08/26/21 0435 08/27/21 0400 08/28/21 0633 08/29/21 0350 08/30/21 0500  WBC 7.5 8.3 9.2 9.6 9.2  HGB  9.7* 9.2* 9.3* 7.9* 7.8*  HCT 31.0* 29.4* 29.3* 24.9* 24.2*  MCV 94.5 94.2 93.9 93.6 94.2  PLT 163 178 206 201 199    Cardiac Enzymes: Recent Labs  Lab 08/27/21 0400  CKTOTAL 71    BNP: Invalid input(s): POCBNP  CBG: Recent Labs  Lab 08/31/21 0619 08/31/21 1141 08/31/21 1615 08/31/21 2038 09/01/21 0629  GLUCAP 229* 291* 182* 83 143*    Microbiology: Results for orders placed or performed during the hospital encounter of 08/16/21  Resp Panel by RT-PCR (Flu A&B, Covid) Nasopharyngeal Swab     Status: None   Collection Time: 08/16/21  8:28 PM   Specimen: Nasopharyngeal Swab; Nasopharyngeal(NP) swabs in vial transport medium  Result Value Ref Range Status   SARS Coronavirus 2 by RT PCR NEGATIVE NEGATIVE Final    Comment: (NOTE) SARS-CoV-2 target nucleic acids are NOT DETECTED.  The SARS-CoV-2 RNA is generally detectable in upper respiratory specimens during the acute phase of infection. The lowest concentration of SARS-CoV-2 viral copies this assay can detect is 138 copies/mL. A negative result does not preclude SARS-Cov-2 infection and should not be used as the sole basis for treatment or other patient management decisions. A negative result may occur with  improper  specimen collection/handling, submission of specimen other than nasopharyngeal swab, presence of viral mutation(s) within the areas targeted by this assay, and inadequate number of viral copies(<138 copies/mL). A negative result must be combined with clinical observations, patient history, and epidemiological information. The expected result is Negative.  Fact Sheet for Patients:  EntrepreneurPulse.com.au  Fact Sheet for Healthcare Providers:  IncredibleEmployment.be  This test is no t yet approved or cleared by the [place] FDA and  has been authorized for detection and/or diagnosis of SARS-CoV-2 by FDA under an Emergency Use Authorization (EUA). This EUA will  remain  in effect (meaning this test can be used) for the duration of the COVID-19 declaration under Section 564(b)(1) of the Act, 21 U.S.C.section 360bbb-3(b)(1), unless the authorization is terminated  or revoked sooner.       Influenza A by PCR NEGATIVE NEGATIVE Final   Influenza B by PCR NEGATIVE NEGATIVE Final    Comment: (NOTE) The Xpert Xpress SARS-CoV-2/FLU/RSV plus assay is intended as an aid in the diagnosis of influenza from Nasopharyngeal swab specimens and should not be used as a sole basis for treatment. Nasal washings and aspirates are unacceptable for Xpert Xpress SARS-CoV-2/FLU/RSV testing.  Fact Sheet for Patients: EntrepreneurPulse.com.au  Fact Sheet for Healthcare Providers: IncredibleEmployment.be  This test is not yet approved or cleared by the [place] FDA and has been authorized for detection and/or diagnosis of SARS-CoV-2 by FDA under an Emergency Use Authorization (EUA). This EUA will remain in effect (meaning this test can be used) for the duration of the COVID-19 declaration under Section 564(b)(1) of the Act, 21 U.S.C. section 360bbb-3(b)(1), unless the authorization is terminated or revoked.  Performed at [facility], [address] [place], [place] [address]   Urine Culture     Status: None   Collection Time: 08/17/21 11:00 AM   Specimen: Urine, Clean Catch  Result Value Ref Range Status   Specimen Description   Final    URINE, CLEAN CATCH Performed at [facility], [address], [place], [place] [address]    Special Requests   Final    NONE Performed at [facility], [address], [place], [place] [address]    Culture   Final    NO GROWTH Performed at [facility], [place] [address] [place], [place] [address]    Report Status 08/19/2021 FINAL  Final  Culture, blood (Routine X 2) w Reflex to ID Panel     Status: None   Collection Time: 08/19/21  7:28 AM   Specimen: BLOOD LEFT ARM  Result  Value Ref Range Status   Specimen Description BLOOD LEFT ARM BOTTLES DRAWN AEROBIC AND ANAEROBIC  Final   Special Requests   Final    Blood Culture results may not be optimal due to an excessive volume of blood received in culture bottles   Culture   Final    NO GROWTH 5 DAYS Performed at [facility], [address], [place], [place] [address]    Report Status 08/24/2021 FINAL  Final  Culture, blood (Routine X 2) w Reflex to ID Panel     Status: None   Collection Time: 08/19/21  8:27 AM   Specimen: BLOOD LEFT HAND  Result Value Ref Range Status   Specimen Description   Final    BLOOD LEFT HAND BOTTLES DRAWN AEROBIC AND ANAEROBIC   Special Requests Blood Culture adequate volume  Final   Culture   Final    NO GROWTH 5 DAYS Performed at [facility], [address], [place], [place] [address]    Report Status 08/24/2021 FINAL  Final  MRSA Next Gen by PCR, Nasal     Status: Abnormal   Collection Time: 08/21/21  5:40 PM   Specimen: Nasal Mucosa; Nasal Swab  Result Value Ref Range Status   MRSA by PCR Next Gen DETECTED (A) NOT DETECTED Final    Comment: RESULT CALLED TO, READ BACK BY AND VERIFIED WITH: JESSICA HEARN RN,2021,08/21/2021, SELF S. (NOTE) The GeneXpert MRSA Assay (FDA approved for NASAL specimens only), is one component of a comprehensive MRSA colonization surveillance program. It is not intended to diagnose MRSA infection nor to guide or monitor treatment for MRSA infections. Test performance is not FDA approved in patients less than 63 years old. Performed at [facility], [address], [place], [place] [address]   MRSA Next Gen by PCR, Nasal     Status: Abnormal   Collection Time: 08/26/21 12:49 AM   Specimen: Nasal Mucosa; Nasal Swab  Result Value Ref Range Status   MRSA by PCR Next Gen DETECTED (A) NOT DETECTED Final    Comment: RESULT CALLED TO, READ BACK BY AND VERIFIED WITH: RN DAVID S.  [id number] FCP (NOTE) The GeneXpert MRSA Assay (FDA approved for NASAL  specimens only), is one component of a comprehensive MRSA colonization surveillance program. It is not intended to diagnose MRSA infection nor to guide or monitor treatment for MRSA infections. Test performance is not FDA approved in patients less than 29 years old. Performed at [facility], [place] [address], [place], [place] [address]   Respiratory (~20 pathogens) panel by PCR     Status: None   Collection Time: 08/26/21  9:31 AM   Specimen: Nasopharyngeal Swab; Respiratory  Result Value Ref Range Status   Adenovirus NOT DETECTED NOT DETECTED Final   Coronavirus 229E NOT DETECTED NOT DETECTED Final    Comment: (NOTE) The Coronavirus on the Respiratory Panel, DOES NOT test for the novel  Coronavirus (2019 nCoV)    Coronavirus HKU1 NOT DETECTED NOT DETECTED Final   Coronavirus NL63 NOT DETECTED NOT DETECTED Final   Coronavirus OC43 NOT DETECTED NOT DETECTED Final   Metapneumovirus NOT DETECTED NOT DETECTED Final   Rhinovirus / Enterovirus NOT DETECTED NOT DETECTED Final   Influenza A NOT DETECTED NOT DETECTED Final   Influenza B NOT DETECTED NOT DETECTED Final   Parainfluenza Virus 1 NOT DETECTED NOT DETECTED Final   Parainfluenza Virus 2 NOT DETECTED NOT DETECTED Final   Parainfluenza Virus 3 NOT DETECTED NOT DETECTED Final   Parainfluenza Virus 4 NOT DETECTED NOT DETECTED Final   Respiratory Syncytial Virus NOT DETECTED NOT DETECTED Final   Bordetella pertussis NOT DETECTED NOT DETECTED Final   Bordetella Parapertussis NOT DETECTED NOT DETECTED Final   Chlamydophila pneumoniae NOT DETECTED NOT DETECTED Final   Mycoplasma pneumoniae NOT DETECTED NOT DETECTED Final    Comment: Performed at [facility], [place]. [address] [place], [place] [address]    Coagulation Studies: No results for input(s): LABPROT, INR in the last 72 hours.   Urinalysis: No results for input(s): COLORURINE, LABSPEC, PHURINE, GLUCOSEU, HGBUR, BILIRUBINUR, KETONESUR, PROTEINUR, UROBILINOGEN,  NITRITE, LEUKOCYTESUR in the last 72 hours.  Invalid input(s): APPERANCEUR     Imaging: No results found.   Medications:    sodium chloride 250 mL (08/26/21 1235)    allopurinol  100 mg Oral Daily   aspirin EC  81 mg Oral Daily   atorvastatin  80  mg Oral QHS   Chlorhexidine Gluconate Cloth  6 each Topical Daily   diltiazem  60 mg Oral Q8H   feeding supplement (GLUCERNA SHAKE)  237 mL Oral TID BM   heparin injection (subcutaneous)  5,000 Units Subcutaneous Q8H   insulin aspart  0-5 Units Subcutaneous QHS   insulin aspart  0-9 Units Subcutaneous TID WC   insulin aspart  2 Units Subcutaneous TID WC   insulin detemir  17 Units Subcutaneous QHS   labetalol  200 mg Oral BID   lipase/protease/amylase  36,000-72,000 Units Oral See admin instructions   multivitamin with minerals  1 tablet Oral Daily   pantoprazole  40 mg Oral Daily   sodium chloride flush  3 mL Intravenous Q12H   sodium chloride, acetaminophen, cyclobenzaprine, diphenhydrAMINE, diphenoxylate-atropine, haloperidol lactate, HYDROmorphone, ipratropium-albuterol, LORazepam, meclizine, ondansetron **OR** ondansetron (ZOFRAN) IV, sodium chloride flush, temazepam  Assessment/ Plan:  AKI/CKD stage IIIb - in setting of PNA and hypertensive urgency and chronic diarrhea.  Baseline Scr variable 1.6-2.2 and follows Dr. Theador Hawthorne as an outpatient. Required HD 10/25 due to hyperkalemia with UF of 2 liters.  Initial AKI improved and worsened between 10/30 and 11/3. No improving again Serologies negative    Urine output was noted to increase but not well documented. Does not appear volume overloaded.  Have been intermittently hydrating gently Her second AKI was likely related to hemodynamic changes due to fluctuating Bps. Finally improving today. Will Decrease HTN meds as needed Maintain temp HD catheter for now but if Crt improves tomorrow can be removed Acute hypoxic respiratory failure - due to multifocal pneumonia vs CHF.  S/p  antibiotics. V/Q negative. Much improved. Careful with hydration HTN urgency/emergency -much improved at this time.  Stopping hydral today due to intermittent low BPs AMS  much improved; multifactorial Anemia of critical illness - s/p transfusion.  Continue to follow H/H.  Thrombocytopenia -now normalized.  Possibly was related to hypertension    LOS: 15

## 2021-09-01 NOTE — Progress Notes (Signed)
Occupational Therapy Treatment Patient Details Name: Heather [name] MRN: [id number] DOB: [birth year] Today's Date: 09/01/2021   History of present illness Pt is a 70 y.o. female who presented 08/16/21 with blurred vision, HTN, and headache. Found to have altered mental status with AKI on CKD, needed dialysis on 10/23. EEG mild diffuse slowing, MRI on 10/19 no acute stroke, MRA left ACA stenosis, MRV negative. However on 10/30 MRI showed left cerebellum and left frontal small/punctate infarcts. S/p temporary dialysis central line placement R internal jugular 10/23. PMH: CKD 3, Crohn's disease, hypertension, diabetes mellitus, BPPV stroke   OT comments  Pt. Seen for skilled OT treatment.  Able to complete bed mobility, LB dressing, and toileting tasks with min guard a/min a.  Moving well.  Current follow up recommendations remain appropriate.     Recommendations for follow up therapy are one component of a multi-disciplinary discharge planning process, led by the attending physician.  Recommendations may be updated based on patient status, additional functional criteria and insurance authorization.    Follow Up Recommendations  Home health OT    Assistance Recommended at Discharge Intermittent Supervision/Assistance  Equipment Recommendations       Recommendations for Other Services      Precautions / Restrictions Precautions Precautions: Fall;Other (comment) Precaution Comments: monitor BP       Mobility Bed Mobility Overal bed mobility: Needs Assistance Bed Mobility: Supine to Sit     Supine to sit: Supervision;HOB elevated          Transfers Overall transfer level: Needs assistance Equipment used: 1 person hand held assist Transfers: Sit to/from Stand;Stand Pivot Transfers Sit to Stand: Min guard;Min assist Stand pivot transfers: Min guard;Min assist         General transfer comment: pt. requested ambulation without DME, good demo of safe furniture walking in  room to/from b.room to recliner     Balance                                           ADL either performed or assessed with clinical judgement   ADL                       Lower Body Dressing: Min guard;Sitting/lateral leans;Minimal assistance Lower Body Dressing Details (indicate cue type and reason): seated un supported eob, assistance required for RLE reports it is from previous leg injury approx. one year ago Toilet Transfer: Minimal assistance;Regular Toilet;Grab bars   Toileting- Clothing Manipulation and Hygiene: Set up;Sitting/lateral lean         General ADL Comments: pt. reports she does not usually use rw so requested not to.  relying on furniture walking.  educated on only using sturdy items for support     Vision       Perception     Praxis      Cognition Arousal/Alertness: Awake/alert Behavior During Therapy: WFL for tasks assessed/performed                                   General Comments: pt. states "its gonna take a few days to get my head right"          Exercises     Shoulder Instructions       General Comments      Pertinent Vitals/ Pain  Pain Assessment: No/denies pain  Home Living                                          Prior Functioning/Environment              Frequency  Min 2X/week        Progress Toward Goals  OT Goals(current goals can now be found in the care plan section)  Progress towards OT goals: Progressing toward goals     Plan Discharge plan remains appropriate    Co-evaluation                 AM-PAC OT "6 Clicks" Daily Activity     Outcome Measure   Help from another person eating meals?: None Help from another person taking care of personal grooming?: A Little Help from another person toileting, which includes using toliet, bedpan, or urinal?: A Little Help from another person bathing (including washing, rinsing, drying)?: A  Little Help from another person to put on and taking off regular upper body clothing?: None Help from another person to put on and taking off regular lower body clothing?: A Little 6 Click Score: 20    End of Session Equipment Utilized During Treatment: Gait belt  OT Visit Diagnosis: Unsteadiness on feet (R26.81);Other symptoms and signs involving cognitive function   Activity Tolerance Patient tolerated treatment well   Patient Left in chair;with call bell/phone within reach;with chair alarm set   Nurse Communication Other (comment) (rn present during session giving meds)        Time: 5035-4656 OT Time Calculation (min): 18 min  Charges: OT General Charges $OT Visit: 1 Visit OT Treatments $Self Care/Home Management : 8-22 mins  Sonia Baller, COTA/L Acute Rehabilitation [phone]   Tanya Nones 09/01/2021, 10:49 AM

## 2021-09-01 NOTE — Progress Notes (Signed)
Patient called out saying that she was "getting out of here". Staff reoriented patient. Patient stated that, "she had to use the bathroom". 2 NT attempted to stand patient up to walk to the bathroom. Patient's knee started to buckle a couple of times. Patient unsafe to walk to the bathroom at this time.   Patient continues to be confused. Patient has been reoriented several times.   Telesitter order has been reordered. Will continue to monitor

## 2021-09-01 NOTE — Progress Notes (Signed)
Mobility Specialist: Progress Note   09/01/21 1540  Mobility  Activity Ambulated in hall  Level of Assistance Contact guard assist, steadying assist  Assistive Device Front wheel walker  Distance Ambulated (ft) 140 ft  Mobility Ambulated with assistance in hallway  Mobility Response Tolerated well  Mobility performed by Mobility specialist  $Mobility charge 1 Mobility   Pt to BR and then agreeable to ambulation. Pt required contact guard for balance d/t feeling light headed today. Pt back to bed after walk with call bell and phone at her side. Bed alarm is on.   [facility] Khrystyna Schwalm Mobility Specialist Mobility Specialist Phone: [phone]

## 2021-09-02 LAB — RENAL FUNCTION PANEL
Albumin: 2.5 g/dL — ABNORMAL LOW (ref 3.5–5.0)
Anion gap: 9 (ref 5–15)
BUN: 77 mg/dL — ABNORMAL HIGH (ref 8–23)
CO2: 22 mmol/L (ref 22–32)
Calcium: 9 mg/dL (ref 8.9–10.3)
Chloride: 100 mmol/L (ref 98–111)
Creatinine, Ser: 4.51 mg/dL — ABNORMAL HIGH (ref 0.44–1.00)
GFR, Estimated: 10 mL/min — ABNORMAL LOW (ref >60)
Glucose, Bld: 216 mg/dL — ABNORMAL HIGH (ref 70–99)
Phosphorus: 6.2 mg/dL — ABNORMAL HIGH (ref 2.5–4.6)
Potassium: 5.3 mmol/L — ABNORMAL HIGH (ref 3.5–5.1)
Sodium: 131 mmol/L — ABNORMAL LOW (ref 135–145)

## 2021-09-02 LAB — GLUCOSE, CAPILLARY
Glucose-Capillary: 154 mg/dL — ABNORMAL HIGH (ref 70–99)
Glucose-Capillary: 279 mg/dL — ABNORMAL HIGH (ref 70–99)
Glucose-Capillary: 205 mg/dL — ABNORMAL HIGH (ref 70–99)
Glucose-Capillary: 111 mg/dL — ABNORMAL HIGH (ref 70–99)

## 2021-09-02 MED ORDER — SODIUM ZIRCONIUM CYCLOSILICATE 5 G PO PACK
5.0000 g | PACK | Freq: Once | ORAL | Status: AC
  Administered 2021-09-03: 5 g via ORAL
  Filled 2021-09-02: qty 1

## 2021-09-02 NOTE — Progress Notes (Signed)
[facility] ROUNDING NOTE   Subjective:   Patient continues to feel well today.  Blood pressure slightly higher.  Kidney function continues to slowly improve  Objective:  Vital signs in last 24 hours:  Temp:  [97.9 F (36.6 C)-98.5 F (36.9 C)] 98.5 F (36.9 C) (11/05 0735) Pulse Rate:  [72-89] 72 (11/05 0735) Resp:  [16-21] 16 (11/05 0735) BP: (132-166)/(48-74) 132/54 (11/05 0735) SpO2:  [93 %-98 %] 96 % (11/05 0735)  Weight change:  Filed Weights   08/30/21 0600 08/31/21 0331 09/01/21 0343  Weight: 70.3 kg 69.1 kg 70.2 kg    Intake/Output: I/O last 3 completed shifts: In: 243 [P.O.:240; I.V.:3] Out: 350 [Urine:350]   Intake/Output this shift:  No intake/output data recorded. GEN: sitting in bed, no distress CVS-normal rate, no rub RS- no iwob, bilateral chest rise ABD- BS present soft non-distended EXT- no edema Neuro - awake, alert, oriented to person and place  Basic Metabolic Panel: Recent Labs  Lab 08/27/21 0400 08/28/21 0633 08/29/21 0350 08/30/21 0500 08/31/21 0500 09/01/21 0325 09/02/21 0254  NA 144 141 132* 130* 130* 131* 131*  K 2.8* 3.0* 3.4* 3.9 4.3 4.7 5.3*  CL 101 100 98 97* 97* 100 100  CO2 29 28 24 24 23 22 22   GLUCOSE 220* 151* 264* 257* 269* 157* 216*  BUN 76* 79* 79* 78* 80* 77* 77*  CREATININE 3.37* 3.88* 4.32* 4.60* 4.93* 4.61* 4.51*  CALCIUM 9.3 9.1 8.4* 8.6* 8.8* 8.7* 9.0  MG 1.6* 2.4  --   --   --   --   --   PHOS 3.4  3.4 4.1 4.1 5.3* 6.2* 6.3* 6.2*    Liver Function Tests: Recent Labs  Lab 08/29/21 0350 08/30/21 0500 08/31/21 0500 09/01/21 0325 09/02/21 0254  ALBUMIN 2.2* 2.4* 2.4* 2.4* 2.5*   No results for input(s): LIPASE, AMYLASE in the last 168 hours. No results for input(s): AMMONIA in the last 168 hours.   CBC: Recent Labs  Lab 08/27/21 0400 08/28/21 0633 08/29/21 0350 08/30/21 0500  WBC 8.3 9.2 9.6 9.2  HGB 9.2* 9.3* 7.9* 7.8*  HCT 29.4* 29.3* 24.9* 24.2*  MCV 94.2 93.9 93.6 94.2   PLT 178 206 201 199    Cardiac Enzymes: Recent Labs  Lab 08/27/21 0400  CKTOTAL 71    BNP: Invalid input(s): POCBNP  CBG: Recent Labs  Lab 09/01/21 0629 09/01/21 1245 09/01/21 1643 09/01/21 2130 09/02/21 0626  GLUCAP 143* 164* 152* 241* 154*    Microbiology: Results for orders placed or performed during the hospital encounter of 08/16/21  Resp Panel by RT-PCR (Flu A&B, Covid) Nasopharyngeal Swab     Status: None   Collection Time: 08/16/21  8:28 PM   Specimen: Nasopharyngeal Swab; Nasopharyngeal(NP) swabs in vial transport medium  Result Value Ref Range Status   SARS Coronavirus 2 by RT PCR NEGATIVE NEGATIVE Final    Comment: (NOTE) SARS-CoV-2 target nucleic acids are NOT DETECTED.  The SARS-CoV-2 RNA is generally detectable in upper respiratory specimens during the acute phase of infection. The lowest concentration of SARS-CoV-2 viral copies this assay can detect is 138 copies/mL. A negative result does not preclude SARS-Cov-2 infection and should not be used as the sole basis for treatment or other patient management decisions. A negative result may occur with  improper specimen collection/handling, submission of specimen other than nasopharyngeal swab, presence of viral mutation(s) within the areas targeted by this assay, and inadequate number of viral copies(<138 copies/mL). A negative result must be  combined with clinical observations, patient history, and epidemiological information. The expected result is Negative.  Fact Sheet for Patients:  EntrepreneurPulse.com.au  Fact Sheet for Healthcare Providers:  IncredibleEmployment.be  This test is no t yet approved or cleared by the [place] FDA and  has been authorized for detection and/or diagnosis of SARS-CoV-2 by FDA under an Emergency Use Authorization (EUA). This EUA will remain  in effect (meaning this test can be used) for the duration of the COVID-19  declaration under Section 564(b)(1) of the Act, 21 U.S.C.section 360bbb-3(b)(1), unless the authorization is terminated  or revoked sooner.       Influenza A by PCR NEGATIVE NEGATIVE Final   Influenza B by PCR NEGATIVE NEGATIVE Final    Comment: (NOTE) The Xpert Xpress SARS-CoV-2/FLU/RSV plus assay is intended as an aid in the diagnosis of influenza from Nasopharyngeal swab specimens and should not be used as a sole basis for treatment. Nasal washings and aspirates are unacceptable for Xpert Xpress SARS-CoV-2/FLU/RSV testing.  Fact Sheet for Patients: EntrepreneurPulse.com.au  Fact Sheet for Healthcare Providers: IncredibleEmployment.be  This test is not yet approved or cleared by the [place] FDA and has been authorized for detection and/or diagnosis of SARS-CoV-2 by FDA under an Emergency Use Authorization (EUA). This EUA will remain in effect (meaning this test can be used) for the duration of the COVID-19 declaration under Section 564(b)(1) of the Act, 21 U.S.C. section 360bbb-3(b)(1), unless the authorization is terminated or revoked.  Performed at [facility], [address] [place], [place] [address]   Urine Culture     Status: None   Collection Time: 08/17/21 11:00 AM   Specimen: Urine, Clean Catch  Result Value Ref Range Status   Specimen Description   Final    URINE, CLEAN CATCH Performed at [facility], [address] [place], [place] [address]    Special Requests   Final    NONE Performed at [facility], [address] [place], [place] [address]    Culture   Final    NO GROWTH Performed at [facility], [place] [address], [place], [place] [address]    Report Status 08/19/2021 FINAL  Final  Culture, blood (Routine X 2) w Reflex to ID Panel     Status: None   Collection Time: 08/19/21  7:28 AM   Specimen: BLOOD LEFT ARM  Result Value Ref Range Status   Specimen Description BLOOD LEFT ARM BOTTLES DRAWN AEROBIC  AND ANAEROBIC  Final   Special Requests   Final    Blood Culture results may not be optimal due to an excessive volume of blood received in culture bottles   Culture   Final    NO GROWTH 5 DAYS Performed at [facility], [address] [place], [place] [address]    Report Status 08/24/2021 FINAL  Final  Culture, blood (Routine X 2) w Reflex to ID Panel     Status: None   Collection Time: 08/19/21  8:27 AM   Specimen: BLOOD LEFT HAND  Result Value Ref Range Status   Specimen Description   Final    BLOOD LEFT HAND BOTTLES DRAWN AEROBIC AND ANAEROBIC   Special Requests Blood Culture adequate volume  Final   Culture   Final    NO GROWTH 5 DAYS Performed at [facility], [address] [place], [place] [address]    Report Status 08/24/2021 FINAL  Final  MRSA Next Gen by PCR, Nasal     Status: Abnormal  Collection Time: 08/21/21  5:40 PM   Specimen: Nasal Mucosa; Nasal Swab  Result Value Ref Range Status   MRSA by PCR Next Gen DETECTED (A) NOT DETECTED Final    Comment: RESULT CALLED TO, READ BACK BY AND VERIFIED WITH: JESSICA HEARN RN,2021,08/21/2021, SELF S. (NOTE) The GeneXpert MRSA Assay (FDA approved for NASAL specimens only), is one component of a comprehensive MRSA colonization surveillance program. It is not intended to diagnose MRSA infection nor to guide or monitor treatment for MRSA infections. Test performance is not FDA approved in patients less than 37 years old. Performed at [facility], [address], [place], [place] [address]   MRSA Next Gen by PCR, Nasal     Status: Abnormal   Collection Time: 08/26/21 12:49 AM   Specimen: Nasal Mucosa; Nasal Swab  Result Value Ref Range Status   MRSA by PCR Next Gen DETECTED (A) NOT DETECTED Final    Comment: RESULT CALLED TO, READ BACK BY AND VERIFIED WITH: RN DAVID S.  [id number] FCP (NOTE) The GeneXpert MRSA Assay (FDA approved for NASAL specimens only), is one component of a comprehensive MRSA colonization  surveillance program. It is not intended to diagnose MRSA infection nor to guide or monitor treatment for MRSA infections. Test performance is not FDA approved in patients less than 61 years old. Performed at [facility], [place] [address] [place], [place] [address]   Respiratory (~20 pathogens) panel by PCR     Status: None   Collection Time: 08/26/21  9:31 AM   Specimen: Nasopharyngeal Swab; Respiratory  Result Value Ref Range Status   Adenovirus NOT DETECTED NOT DETECTED Final   Coronavirus 229E NOT DETECTED NOT DETECTED Final    Comment: (NOTE) The Coronavirus on the Respiratory Panel, DOES NOT test for the novel  Coronavirus (2019 nCoV)    Coronavirus HKU1 NOT DETECTED NOT DETECTED Final   Coronavirus NL63 NOT DETECTED NOT DETECTED Final   Coronavirus OC43 NOT DETECTED NOT DETECTED Final   Metapneumovirus NOT DETECTED NOT DETECTED Final   Rhinovirus / Enterovirus NOT DETECTED NOT DETECTED Final   Influenza A NOT DETECTED NOT DETECTED Final   Influenza B NOT DETECTED NOT DETECTED Final   Parainfluenza Virus 1 NOT DETECTED NOT DETECTED Final   Parainfluenza Virus 2 NOT DETECTED NOT DETECTED Final   Parainfluenza Virus 3 NOT DETECTED NOT DETECTED Final   Parainfluenza Virus 4 NOT DETECTED NOT DETECTED Final   Respiratory Syncytial Virus NOT DETECTED NOT DETECTED Final   Bordetella pertussis NOT DETECTED NOT DETECTED Final   Bordetella Parapertussis NOT DETECTED NOT DETECTED Final   Chlamydophila pneumoniae NOT DETECTED NOT DETECTED Final   Mycoplasma pneumoniae NOT DETECTED NOT DETECTED Final    Comment: Performed at [facility], [place]. [address], [place], [place] [address]    Coagulation Studies: No results for input(s): LABPROT, INR in the last 72 hours.   Urinalysis: No results for input(s): COLORURINE, LABSPEC, PHURINE, GLUCOSEU, HGBUR, BILIRUBINUR, KETONESUR, PROTEINUR, UROBILINOGEN, NITRITE, LEUKOCYTESUR in the last 72 hours.  Invalid input(s):  APPERANCEUR     Imaging: No results found.   Medications:    sodium chloride 250 mL (08/26/21 1235)    allopurinol  100 mg Oral Daily   aspirin EC  81 mg Oral Daily   atorvastatin  80 mg Oral QHS   Chlorhexidine Gluconate Cloth  6 each Topical Daily   diltiazem  60 mg Oral Q8H   feeding supplement (GLUCERNA SHAKE)  237 mL Oral TID  BM   heparin injection (subcutaneous)  5,000 Units Subcutaneous Q8H   insulin aspart  0-5 Units Subcutaneous QHS   insulin aspart  0-9 Units Subcutaneous TID WC   insulin aspart  2 Units Subcutaneous TID WC   insulin detemir  17 Units Subcutaneous QHS   labetalol  200 mg Oral BID   lipase/protease/amylase  36,000-72,000 Units Oral See admin instructions   multivitamin with minerals  1 tablet Oral Daily   pantoprazole  40 mg Oral Daily   sodium chloride flush  3 mL Intravenous Q12H   sodium chloride, acetaminophen, cyclobenzaprine, diphenhydrAMINE, diphenoxylate-atropine, haloperidol lactate, HYDROmorphone, ipratropium-albuterol, LORazepam, meclizine, ondansetron **OR** ondansetron (ZOFRAN) IV, sodium chloride flush, temazepam  Assessment/ Plan:  AKI/CKD stage IIIb - in setting of PNA and hypertensive urgency and chronic diarrhea.  Baseline Scr variable 1.6-2.2 and follows Dr. Theador Hawthorne as an outpatient. Required HD 10/25 due to hyperkalemia.  Initial AKI improved and worsened between 10/30 and 11/3. Now improving again Serologies negative    Urine output was noted to increase but not well documented. Does not appear volume overloaded.  Have been intermittently hydrating gently Her second AKI was likely related to hemodynamic changes due to fluctuating Bps. Now slowly improving after decreasing HTN meds Okay to remove temporary HD catheter today Acute hypoxic respiratory failure - due to multifocal pneumonia vs CHF.  S/p antibiotics. V/Q negative. Much improved. Careful with hydration HTN urgency/emergency -much improved at this time.  Hydralazine was  stopped due to intermittently low blood pressures.  Blood pressure slightly higher today.  Continue to monitor AMS  much improved; multifactorial Anemia of critical illness - s/p transfusion.  Continue to follow H/H.  Thrombocytopenia -now normalized.  Possibly was related to hypertension    LOS: 16

## 2021-09-02 NOTE — Progress Notes (Signed)
[facility]  PROGRESS NOTE  Heather [name] GBT:[id number] DOB: Nov 20, 1950 DOA: 08/16/2021 PCP: [name], MD   Brief HPI:   70 year old female with past medical history of CKD stage IIb, Crohn's disease, hypertension, diabetes mellitus type 2, CVA presented with blurry vision.  Patient reported 2 days of blurry vision with stabbing headache, nausea vomiting.  She came to ED for further evaluation. Brain MRI/MRA showed no acute changes. Further work-up with CT chest showed multifocal pneumonia, bilateral pleural effusion, pulmonary edema.  Patient started on IV antibiotics and also received furosemide. She had worsening renal function requiring renal replacement therapy x210/23 and 10/25  Patient was treated with ceftriaxone and vancomycin for atypical infection, before that she was on cefepime and linezolid. After 9 days of therapy her antibiotics have been discontinued.    Her renal function still not stable. Pending removal of HD catheter before she can go home with home health services.     Subjective   Patient seen and examined, denies any complaints.   Assessment/Plan:    Acute hypoxemic respiratory failure -Due to multifocal pneumonia/pulmonary edema -Completed antibiotic therapy -No longer requiring oxygen, O2 sats 98% on room air   Acute kidney injury on CKD stage IIIb -Likely in setting of pneumonia, hypertensive urgency and chronic diarrhea -Required hemodialysis on 10/23 and 10/25 -Serum creatinine slowly improving, today creatinine is 4.51 -Temporary HD catheter will be removed today as per nephrology    Metabolic encephalopathy -Resolved  Diabetes mellitus type 2 -CBG well controlled -Continue sliding scale insulin with NovoLog  Crohn's disease -At home on immunosuppression with Humira  Acute on chronic anemia -Hemoglobin stable, s/p units PRBC  Hypertension -Blood pressure well controlled -Continue diltiazem, hydralazine,  labetalol  Blurred vision -Intermittent -Patient has seen ophthalmology in the past -Patient will follow-up with ophthalmology as outpatient  Mild hyperkalemia -Potassium was 5.3 today -We will give 1 dose of Lokelma 5 g p.o. x1  Scheduled medications:    allopurinol  100 mg Oral Daily   aspirin EC  81 mg Oral Daily   atorvastatin  80 mg Oral QHS   Chlorhexidine Gluconate Cloth  6 each Topical Daily   diltiazem  60 mg Oral Q8H   feeding supplement (GLUCERNA SHAKE)  237 mL Oral TID BM   heparin injection (subcutaneous)  5,000 Units Subcutaneous Q8H   insulin aspart  0-5 Units Subcutaneous QHS   insulin aspart  0-9 Units Subcutaneous TID WC   insulin aspart  2 Units Subcutaneous TID WC   insulin detemir  17 Units Subcutaneous QHS   labetalol  200 mg Oral BID   lipase/protease/amylase  36,000-72,000 Units Oral See admin instructions   multivitamin with minerals  1 tablet Oral Daily   pantoprazole  40 mg Oral Daily   sodium chloride flush  3 mL Intravenous Q12H     Data Reviewed:   CBG:  Recent Labs  Lab 09/01/21 1643 09/01/21 2130 09/02/21 0626 09/02/21 1149 09/02/21 1540  GLUCAP 152* 241* 154* 279* 205*    SpO2: 97 % O2 Flow Rate (L/min): 3 L/min    Vitals:   09/02/21 0314 09/02/21 0735 09/02/21 1147 09/02/21 1542  BP: 137/69 (!) 132/54 (!) 176/57 (!) 148/57  Pulse: 73 72 82 85  Resp: 20 16 16    Temp: 98.4 F (36.9 C) 98.5 F (36.9 C) 98.1 F (36.7 C) 98.3 F (36.8 C)  TempSrc: Oral Oral Oral Oral  SpO2: 97% 96% 97% 97%  Weight:      Height:  Intake/Output Summary (Last 24 hours) at 09/02/2021 1629 Last data filed at 09/01/2021 2142 Gross per 24 hour  Intake 3 ml  Output --  Net 3 ml    11/03 1901 - 11/05 0700 In: 243 [P.O.:240; I.V.:3] Out: 350 [Urine:350]  Filed Weights   08/30/21 0600 08/31/21 0331 09/01/21 0343  Weight: 70.3 kg 69.1 kg 70.2 kg    Data Reviewed: Basic Metabolic Panel: Recent Labs  Lab 08/27/21 0400  08/28/21 0633 08/29/21 0350 08/30/21 0500 08/31/21 0500 09/01/21 0325 09/02/21 0254  NA 144 141 132* 130* 130* 131* 131*  K 2.8* 3.0* 3.4* 3.9 4.3 4.7 5.3*  CL 101 100 98 97* 97* 100 100  CO2 29 28 24 24 23 22 22   GLUCOSE 220* 151* 264* 257* 269* 157* 216*  BUN 76* 79* 79* 78* 80* 77* 77*  CREATININE 3.37* 3.88* 4.32* 4.60* 4.93* 4.61* 4.51*  CALCIUM 9.3 9.1 8.4* 8.6* 8.8* 8.7* 9.0  MG 1.6* 2.4  --   --   --   --   --   PHOS 3.4  3.4 4.1 4.1 5.3* 6.2* 6.3* 6.2*   Liver Function Tests: Recent Labs  Lab 08/29/21 0350 08/30/21 0500 08/31/21 0500 09/01/21 0325 09/02/21 0254  ALBUMIN 2.2* 2.4* 2.4* 2.4* 2.5*   No results for input(s): LIPASE, AMYLASE in the last 168 hours. No results for input(s): AMMONIA in the last 168 hours. CBC: Recent Labs  Lab 08/27/21 0400 08/28/21 0633 08/29/21 0350 08/30/21 0500  WBC 8.3 9.2 9.6 9.2  HGB 9.2* 9.3* 7.9* 7.8*  HCT 29.4* 29.3* 24.9* 24.2*  MCV 94.2 93.9 93.6 94.2  PLT 178 206 201 199   Cardiac Enzymes: Recent Labs  Lab 08/27/21 0400  CKTOTAL 71   BNP (last 3 results) Recent Labs    08/19/21 0728  BNP 430.0*    ProBNP (last 3 results) No results for input(s): PROBNP in the last 8760 hours.  CBG: Recent Labs  Lab 09/01/21 1643 09/01/21 2130 09/02/21 0626 09/02/21 1149 09/02/21 1540  GLUCAP 152* 241* 154* 279* 205*       Radiology Reports  No results found.     Antibiotics: Anti-infectives (From admission, onward)    Start     Dose/Rate Route Frequency Ordered Stop   08/28/21 1419  vancomycin variable dose per unstable renal function (pharmacist dosing)  Status:  Discontinued         Does not apply See admin instructions 08/28/21 1419 08/30/21 1442   08/26/21 1515  vancomycin (VANCOREADY) IVPB 1250 mg/250 mL        1,250 mg 166.7 mL/hr over 90 Minutes Intravenous  Once 08/26/21 1427 08/26/21 1913   08/26/21 1200  ceFEPIme (MAXIPIME) 2 g in sodium chloride 0.9 % 100 mL IVPB  Status:  Discontinued         2 g 200 mL/hr over 30 Minutes Intravenous Every 24 hours 08/25/21 1710 08/26/21 0811   08/26/21 0900  cefTRIAXone (ROCEPHIN) 2 g in sodium chloride 0.9 % 100 mL IVPB  Status:  Discontinued        2 g 200 mL/hr over 30 Minutes Intravenous Every 24 hours 08/26/21 0811 08/30/21 1442   08/23/21 1000  linezolid (ZYVOX) tablet 600 mg  Status:  Discontinued        600 mg Oral Every 12 hours 08/22/21 1038 08/26/21 0959   08/23/21 0900  ceFEPIme (MAXIPIME) 2 g in sodium chloride 0.9 % 100 mL IVPB        2 g 200  mL/hr over 30 Minutes Intravenous Every 24 hours 08/23/21 0736 08/25/21 1900   08/19/21 0745  ceFEPIme (MAXIPIME) 2 g in sodium chloride 0.9 % 100 mL IVPB  Status:  Discontinued        2 g 200 mL/hr over 30 Minutes Intravenous Every 24 hours 08/19/21 0651 08/23/21 0736   08/19/21 0745  vancomycin (VANCOREADY) IVPB 750 mg/150 mL  Status:  Discontinued        750 mg 150 mL/hr over 60 Minutes Intravenous Every 48 hours 08/19/21 0651 08/22/21 1038   08/18/21 0945  fluconazole (DIFLUCAN) tablet 150 mg        150 mg Oral  Once 08/18/21 0853 08/18/21 0951         DVT prophylaxis: Heparin  Code Status: DNR  Family Communication: No family at bedside   Consultants: Nephrology  Procedures:     Objective    Physical Examination:   General-appears in no acute distress Heart-S1-S2, regular, no murmur auscultated Lungs-clear to auscultation bilaterally, no wheezing or crackles auscultated Abdomen-soft, nontender, no organomegaly Extremities-no edema in the lower extremities Neuro-alert, oriented x3, no focal deficit noted  Status is: Inpatient  Dispo: The patient is from: Home              Anticipated d/c is to: Home              Anticipated d/c date is: 09/04/2021              Patient currently not stable for discharge  Barrier to discharge-none  COVID-19 Labs  No results for input(s): DDIMER, FERRITIN, LDH, CRP in the last 72 hours.  Lab Results  Component  Value Date   SARSCOV2NAA NEGATIVE 08/16/2021   [place] NEGATIVE 01/06/2021   [place] NEGATIVE 11/04/2020   [place] NEGATIVE 05/16/2020     Pressure Injury 08/26/21 Buttocks Right;Left Stage 1 -  Intact skin with non-blanchable redness of a localized area usually over a bony prominence. (Active)  08/26/21 0050  Location: Buttocks  Location Orientation: Right;Left  Staging: Stage 1 -  Intact skin with non-blanchable redness of a localized area usually over a bony prominence.  Wound Description (Comments):   Present on Admission: Yes        Recent Results (from the past 240 hour(s))  MRSA Next Gen by PCR, Nasal     Status: Abnormal   Collection Time: 08/26/21 12:49 AM   Specimen: Nasal Mucosa; Nasal Swab  Result Value Ref Range Status   MRSA by PCR Next Gen DETECTED (A) NOT DETECTED Final    Comment: RESULT CALLED TO, READ BACK BY AND VERIFIED WITH: RN DAVID S.  [id number] FCP (NOTE) The GeneXpert MRSA Assay (FDA approved for NASAL specimens only), is one component of a comprehensive MRSA colonization surveillance program. It is not intended to diagnose MRSA infection nor to guide or monitor treatment for MRSA infections. Test performance is not FDA approved in patients less than 66 years old. Performed at [facility], [place] [address], [place], [place] [address]   Respiratory (~20 pathogens) panel by PCR     Status: None   Collection Time: 08/26/21  9:31 AM   Specimen: Nasopharyngeal Swab; Respiratory  Result Value Ref Range Status   Adenovirus NOT DETECTED NOT DETECTED Final   Coronavirus 229E NOT DETECTED NOT DETECTED Final    Comment: (NOTE) The Coronavirus on the Respiratory Panel, DOES NOT test for the novel  Coronavirus (2019 nCoV)    Coronavirus HKU1 NOT DETECTED NOT  DETECTED Final   Coronavirus NL63 NOT DETECTED NOT DETECTED Final   Coronavirus OC43 NOT DETECTED NOT DETECTED Final   Metapneumovirus NOT DETECTED NOT DETECTED Final    Rhinovirus / Enterovirus NOT DETECTED NOT DETECTED Final   Influenza A NOT DETECTED NOT DETECTED Final   Influenza B NOT DETECTED NOT DETECTED Final   Parainfluenza Virus 1 NOT DETECTED NOT DETECTED Final   Parainfluenza Virus 2 NOT DETECTED NOT DETECTED Final   Parainfluenza Virus 3 NOT DETECTED NOT DETECTED Final   Parainfluenza Virus 4 NOT DETECTED NOT DETECTED Final   Respiratory Syncytial Virus NOT DETECTED NOT DETECTED Final   Bordetella pertussis NOT DETECTED NOT DETECTED Final   Bordetella Parapertussis NOT DETECTED NOT DETECTED Final   Chlamydophila pneumoniae NOT DETECTED NOT DETECTED Final   Mycoplasma pneumoniae NOT DETECTED NOT DETECTED Final    Comment: Performed at [facility], [place] [address], [place], [place] [address]    [facility] If 7PM-7AM, please contact night-coverage at [email or web address], Office  [phone]   09/02/2021, 4:29 PM  LOS: 16 days

## 2021-09-03 LAB — RENAL FUNCTION PANEL
Albumin: 2.6 g/dL — ABNORMAL LOW (ref 3.5–5.0)
Anion gap: 10 (ref 5–15)
BUN: 73 mg/dL — ABNORMAL HIGH (ref 8–23)
CO2: 19 mmol/L — ABNORMAL LOW (ref 22–32)
Calcium: 8.9 mg/dL (ref 8.9–10.3)
Chloride: 106 mmol/L (ref 98–111)
Creatinine, Ser: 4.12 mg/dL — ABNORMAL HIGH (ref 0.44–1.00)
GFR, Estimated: 11 mL/min — ABNORMAL LOW (ref >60)
Glucose, Bld: 127 mg/dL — ABNORMAL HIGH (ref 70–99)
Phosphorus: 6 mg/dL — ABNORMAL HIGH (ref 2.5–4.6)
Potassium: 5.1 mmol/L (ref 3.5–5.1)
Sodium: 135 mmol/L (ref 135–145)

## 2021-09-03 LAB — GLUCOSE, CAPILLARY
Glucose-Capillary: 106 mg/dL — ABNORMAL HIGH (ref 70–99)
Glucose-Capillary: 308 mg/dL — ABNORMAL HIGH (ref 70–99)
Glucose-Capillary: 246 mg/dL — ABNORMAL HIGH (ref 70–99)
Glucose-Capillary: 175 mg/dL — ABNORMAL HIGH (ref 70–99)

## 2021-09-03 NOTE — Progress Notes (Signed)
[facility] ROUNDING NOTE   Subjective:   Patient feels well today without any complaints  Objective:  Vital signs in last 24 hours:  Temp:  [98 F (36.7 C)-98.4 F (36.9 C)] 98.3 F (36.8 C) (11/06 0750) Pulse Rate:  [66-89] 72 (11/06 0750) Resp:  [14-18] 14 (11/06 0750) BP: (130-176)/(54-61) 142/61 (11/06 0750) SpO2:  [97 %-98 %] 98 % (11/06 0750)  Weight change:  Filed Weights   08/30/21 0600 08/31/21 0331 09/01/21 0343  Weight: 70.3 kg 69.1 kg 70.2 kg    Intake/Output: I/O last 3 completed shifts: In: 6 [I.V.:6] Out: -    Intake/Output this shift:  No intake/output data recorded. GEN: sitting in bed, no distress CVS-normal rate, no rub RS- no iwob, bilateral chest rise ABD- BS present soft non-distended EXT- no edema Neuro - awake, alert, oriented to person and place  Basic Metabolic Panel: Recent Labs  Lab 08/28/21 0633 08/29/21 0350 08/30/21 0500 08/31/21 0500 09/01/21 0325 09/02/21 0254 09/03/21 0304  NA 141   < > 130* 130* 131* 131* 135  K 3.0*   < > 3.9 4.3 4.7 5.3* 5.1  CL 100   < > 97* 97* 100 100 106  CO2 28   < > 24 23 22 22  19*  GLUCOSE 151*   < > 257* 269* 157* 216* 127*  BUN 79*   < > 78* 80* 77* 77* 73*  CREATININE 3.88*   < > 4.60* 4.93* 4.61* 4.51* 4.12*  CALCIUM 9.1   < > 8.6* 8.8* 8.7* 9.0 8.9  MG 2.4  --   --   --   --   --   --   PHOS 4.1   < > 5.3* 6.2* 6.3* 6.2* 6.0*   < > = values in this interval not displayed.    Liver Function Tests: Recent Labs  Lab 08/30/21 0500 08/31/21 0500 09/01/21 0325 09/02/21 0254 09/03/21 0304  ALBUMIN 2.4* 2.4* 2.4* 2.5* 2.6*   No results for input(s): LIPASE, AMYLASE in the last 168 hours. No results for input(s): AMMONIA in the last 168 hours.   CBC: Recent Labs  Lab 08/28/21 0633 08/29/21 0350 08/30/21 0500  WBC 9.2 9.6 9.2  HGB 9.3* 7.9* 7.8*  HCT 29.3* 24.9* 24.2*  MCV 93.9 93.6 94.2  PLT 206 201 199    Cardiac Enzymes: No results for input(s): CKTOTAL,  CKMB, CKMBINDEX, TROPONINI in the last 168 hours.   BNP: Invalid input(s): POCBNP  CBG: Recent Labs  Lab 09/02/21 0626 09/02/21 1149 09/02/21 1540 09/02/21 2126 09/03/21 0624  GLUCAP 154* 279* 205* 111* 106*    Microbiology: Results for orders placed or performed during the hospital encounter of 08/16/21  Resp Panel by RT-PCR (Flu A&B, Covid) Nasopharyngeal Swab     Status: None   Collection Time: 08/16/21  8:28 PM   Specimen: Nasopharyngeal Swab; Nasopharyngeal(NP) swabs in vial transport medium  Result Value Ref Range Status   SARS Coronavirus 2 by RT PCR NEGATIVE NEGATIVE Final    Comment: (NOTE) SARS-CoV-2 target nucleic acids are NOT DETECTED.  The SARS-CoV-2 RNA is generally detectable in upper respiratory specimens during the acute phase of infection. The lowest concentration of SARS-CoV-2 viral copies this assay can detect is 138 copies/mL. A negative result does not preclude SARS-Cov-2 infection and should not be used as the sole basis for treatment or other patient management decisions. A negative result may occur with  improper specimen collection/handling, submission of specimen other than nasopharyngeal swab, presence  of viral mutation(s) within the areas targeted by this assay, and inadequate number of viral copies(<138 copies/mL). A negative result must be combined with clinical observations, patient history, and epidemiological information. The expected result is Negative.  Fact Sheet for Patients:  EntrepreneurPulse.com.au  Fact Sheet for Healthcare Providers:  IncredibleEmployment.be  This test is no t yet approved or cleared by the [place] FDA and  has been authorized for detection and/or diagnosis of SARS-CoV-2 by FDA under an Emergency Use Authorization (EUA). This EUA will remain  in effect (meaning this test can be used) for the duration of the COVID-19 declaration under Section 564(b)(1) of the Act,  21 U.S.C.section 360bbb-3(b)(1), unless the authorization is terminated  or revoked sooner.       Influenza A by PCR NEGATIVE NEGATIVE Final   Influenza B by PCR NEGATIVE NEGATIVE Final    Comment: (NOTE) The Xpert Xpress SARS-CoV-2/FLU/RSV plus assay is intended as an aid in the diagnosis of influenza from Nasopharyngeal swab specimens and should not be used as a sole basis for treatment. Nasal washings and aspirates are unacceptable for Xpert Xpress SARS-CoV-2/FLU/RSV testing.  Fact Sheet for Patients: EntrepreneurPulse.com.au  Fact Sheet for Healthcare Providers: IncredibleEmployment.be  This test is not yet approved or cleared by the [place] FDA and has been authorized for detection and/or diagnosis of SARS-CoV-2 by FDA under an Emergency Use Authorization (EUA). This EUA will remain in effect (meaning this test can be used) for the duration of the COVID-19 declaration under Section 564(b)(1) of the Act, 21 U.S.C. section 360bbb-3(b)(1), unless the authorization is terminated or revoked.  Performed at [facility], [address] [place], [place] [address]   Urine Culture     Status: None   Collection Time: 08/17/21 11:00 AM   Specimen: Urine, Clean Catch  Result Value Ref Range Status   Specimen Description   Final    URINE, CLEAN CATCH Performed at [facility], [address], [place], [place] [address]    Special Requests   Final    NONE Performed at [facility], [address] [place], [place] [address]    Culture   Final    NO GROWTH Performed at [facility], [place] [address], [place], [place] [address]    Report Status 08/19/2021 FINAL  Final  Culture, blood (Routine X 2) w Reflex to ID Panel     Status: None   Collection Time: 08/19/21  7:28 AM   Specimen: BLOOD LEFT ARM  Result Value Ref Range Status   Specimen Description BLOOD LEFT ARM BOTTLES DRAWN AEROBIC AND ANAEROBIC  Final   Special Requests   Final     Blood Culture results may not be optimal due to an excessive volume of blood received in culture bottles   Culture   Final    NO GROWTH 5 DAYS Performed at [facility], [address], [place], [place] [address]    Report Status 08/24/2021 FINAL  Final  Culture, blood (Routine X 2) w Reflex to ID Panel     Status: None   Collection Time: 08/19/21  8:27 AM   Specimen: BLOOD LEFT HAND  Result Value Ref Range Status   Specimen Description   Final    BLOOD LEFT HAND BOTTLES DRAWN AEROBIC AND ANAEROBIC   Special Requests Blood Culture adequate volume  Final   Culture   Final    NO GROWTH 5 DAYS Performed at [facility], [address], [place], [place] [address]  Report Status 08/24/2021 FINAL  Final  MRSA Next Gen by PCR, Nasal     Status: Abnormal   Collection Time: 08/21/21  5:40 PM   Specimen: Nasal Mucosa; Nasal Swab  Result Value Ref Range Status   MRSA by PCR Next Gen DETECTED (A) NOT DETECTED Final    Comment: RESULT CALLED TO, READ BACK BY AND VERIFIED WITH: JESSICA HEARN RN,2021,08/21/2021, SELF S. (NOTE) The GeneXpert MRSA Assay (FDA approved for NASAL specimens only), is one component of a comprehensive MRSA colonization surveillance program. It is not intended to diagnose MRSA infection nor to guide or monitor treatment for MRSA infections. Test performance is not FDA approved in patients less than 51 years old. Performed at [facility], [address], [place], [place] [address]   MRSA Next Gen by PCR, Nasal     Status: Abnormal   Collection Time: 08/26/21 12:49 AM   Specimen: Nasal Mucosa; Nasal Swab  Result Value Ref Range Status   MRSA by PCR Next Gen DETECTED (A) NOT DETECTED Final    Comment: RESULT CALLED TO, READ BACK BY AND VERIFIED WITH: RN DAVID S.  [id number] FCP (NOTE) The GeneXpert MRSA Assay (FDA approved for NASAL specimens only), is one component of a comprehensive MRSA colonization surveillance program. It is not intended to diagnose MRSA  infection nor to guide or monitor treatment for MRSA infections. Test performance is not FDA approved in patients less than 96 years old. Performed at [facility], [place] [address] [place], [place] [address]   Respiratory (~20 pathogens) panel by PCR     Status: None   Collection Time: 08/26/21  9:31 AM   Specimen: Nasopharyngeal Swab; Respiratory  Result Value Ref Range Status   Adenovirus NOT DETECTED NOT DETECTED Final   Coronavirus 229E NOT DETECTED NOT DETECTED Final    Comment: (NOTE) The Coronavirus on the Respiratory Panel, DOES NOT test for the novel  Coronavirus (2019 nCoV)    Coronavirus HKU1 NOT DETECTED NOT DETECTED Final   Coronavirus NL63 NOT DETECTED NOT DETECTED Final   Coronavirus OC43 NOT DETECTED NOT DETECTED Final   Metapneumovirus NOT DETECTED NOT DETECTED Final   Rhinovirus / Enterovirus NOT DETECTED NOT DETECTED Final   Influenza A NOT DETECTED NOT DETECTED Final   Influenza B NOT DETECTED NOT DETECTED Final   Parainfluenza Virus 1 NOT DETECTED NOT DETECTED Final   Parainfluenza Virus 2 NOT DETECTED NOT DETECTED Final   Parainfluenza Virus 3 NOT DETECTED NOT DETECTED Final   Parainfluenza Virus 4 NOT DETECTED NOT DETECTED Final   Respiratory Syncytial Virus NOT DETECTED NOT DETECTED Final   Bordetella pertussis NOT DETECTED NOT DETECTED Final   Bordetella Parapertussis NOT DETECTED NOT DETECTED Final   Chlamydophila pneumoniae NOT DETECTED NOT DETECTED Final   Mycoplasma pneumoniae NOT DETECTED NOT DETECTED Final    Comment: Performed at [facility], [place]. [address] [place], [place] [address]    Coagulation Studies: No results for input(s): LABPROT, INR in the last 72 hours.   Urinalysis: No results for input(s): COLORURINE, LABSPEC, PHURINE, GLUCOSEU, HGBUR, BILIRUBINUR, KETONESUR, PROTEINUR, UROBILINOGEN, NITRITE, LEUKOCYTESUR in the last 72 hours.  Invalid input(s): APPERANCEUR     Imaging: No results found.   Medications:     sodium chloride 250 mL (08/26/21 1235)    allopurinol  100 mg Oral Daily   aspirin EC  81 mg Oral Daily   atorvastatin  80 mg Oral QHS   Chlorhexidine Gluconate Cloth  6 each  Topical Daily   diltiazem  60 mg Oral Q8H   feeding supplement (GLUCERNA SHAKE)  237 mL Oral TID BM   heparin injection (subcutaneous)  5,000 Units Subcutaneous Q8H   insulin aspart  0-5 Units Subcutaneous QHS   insulin aspart  0-9 Units Subcutaneous TID WC   insulin aspart  2 Units Subcutaneous TID WC   insulin detemir  17 Units Subcutaneous QHS   labetalol  200 mg Oral BID   lipase/protease/amylase  36,000-72,000 Units Oral See admin instructions   multivitamin with minerals  1 tablet Oral Daily   pantoprazole  40 mg Oral Daily   sodium chloride flush  3 mL Intravenous Q12H   sodium chloride, acetaminophen, cyclobenzaprine, diphenhydrAMINE, diphenoxylate-atropine, haloperidol lactate, HYDROmorphone, ipratropium-albuterol, LORazepam, meclizine, ondansetron **OR** ondansetron (ZOFRAN) IV, sodium chloride flush, temazepam  Assessment/ Plan:  AKI/CKD stage IIIb - in setting of PNA and hypertensive urgency and chronic diarrhea.  Baseline Scr variable 1.6-2.2 and follows Dr. Theador Hawthorne as an outpatient. Required HD 10/25 due to hyperkalemia.  Initial AKI improved and worsened between 10/30 and 11/3. Now improving again Serologies negative    Urine output was noted to increase but not well documented. Does not appear volume overloaded.  Have been intermittently hydrating gently Her second AKI was likely related to hemodynamic changes due to fluctuating Bps. Now slowly improving after decreasing HTN meds Okay to remove temporary HD catheter today; discussed with nurse Acute hypoxic respiratory failure - due to multifocal pneumonia vs CHF.  S/p antibiotics. V/Q negative. Much improved. Careful with hydration HTN urgency/emergency -much improved at this time.  Hydralazine was stopped due to intermittently low blood pressures.   Blood pressure overall stable the past 24 hours.  Continue to monitor AMS  much improved; multifactorial Anemia of critical illness - s/p transfusion.  Continue to follow H/H.  Thrombocytopenia -now normalized.  Possibly was related to hypertension    LOS: 17

## 2021-09-03 NOTE — Progress Notes (Signed)
Mobility Specialist Progress Note:   09/03/21 1435  Mobility  Activity Refused mobility    [facility] Phone [phone]

## 2021-09-03 NOTE — Progress Notes (Addendum)
[facility]  PROGRESS NOTE  Heather [name] WYO:[id number] DOB: 1950-12-31 DOA: 08/16/2021 PCP: [name], MD   Brief HPI:   70 year old female with past medical history of CKD stage IIb, Crohn's disease, hypertension, diabetes mellitus type 2, CVA presented with blurry vision.  Patient reported 2 days of blurry vision with stabbing headache, nausea vomiting.  She came to ED for further evaluation. Brain MRI/MRA showed no acute changes. Further work-up with CT chest showed multifocal pneumonia, bilateral pleural effusion, pulmonary edema.  Patient started on IV antibiotics and also received furosemide. She had worsening renal function requiring renal replacement therapy x210/23 and 10/25  Patient was treated with ceftriaxone and vancomycin for atypical infection, before that she was on cefepime and linezolid. After 9 days of therapy her antibiotics have been discontinued.    Her renal function still not stable. Pending removal of HD catheter before she can go home with home health services.     Subjective   Patient seen and examined, denies any complaints.   Assessment/Plan:    Acute hypoxemic respiratory failure -Due to multifocal pneumonia/pulmonary edema -Completed antibiotic therapy -No longer requiring oxygen, O2 sats 98% on room air   Acute kidney injury on CKD stage IIIb -Likely in setting of pneumonia, hypertensive urgency and chronic diarrhea -Required hemodialysis on 10/23 and 10/25 -Serum creatinine is improving, today creatinine is 4.12 -Still has temporary HD catheter in place -Nephrology plans to remove catheter if creatinine improves tomorrow  Metabolic encephalopathy -Resolved  Diabetes mellitus type 2 -CBG well controlled -Continue sliding scale insulin with NovoLog  Crohn's disease -At home on immunosuppression with Humira  Acute on chronic anemia -Hemoglobin stable, s/p units PRBC  Hypertension -Blood pressure well  controlled -Continue diltiazem, hydralazine, labetalol    Scheduled medications:    allopurinol  100 mg Oral Daily   aspirin EC  81 mg Oral Daily   atorvastatin  80 mg Oral QHS   Chlorhexidine Gluconate Cloth  6 each Topical Daily   diltiazem  60 mg Oral Q8H   feeding supplement (GLUCERNA SHAKE)  237 mL Oral TID BM   heparin injection (subcutaneous)  5,000 Units Subcutaneous Q8H   insulin aspart  0-5 Units Subcutaneous QHS   insulin aspart  0-9 Units Subcutaneous TID WC   insulin aspart  2 Units Subcutaneous TID WC   insulin detemir  17 Units Subcutaneous QHS   labetalol  200 mg Oral BID   lipase/protease/amylase  36,000-72,000 Units Oral See admin instructions   multivitamin with minerals  1 tablet Oral Daily   pantoprazole  40 mg Oral Daily   sodium chloride flush  3 mL Intravenous Q12H     Data Reviewed:   CBG:  Recent Labs  Lab 09/02/21 1149 09/02/21 1540 09/02/21 2126 09/03/21 0624 09/03/21 1333  GLUCAP 279* 205* 111* 106* 308*    SpO2: 96 % O2 Flow Rate (L/min): 3 L/min    Vitals:   09/02/21 2323 09/03/21 0333 09/03/21 0750 09/03/21 1142  BP: (!) 130/57 (!) 134/57 (!) 142/61 (!) 141/51  Pulse: 70 66 72 82  Resp: 18 17 14 16   Temp: 98 F (36.7 C) 98.4 F (36.9 C) 98.3 F (36.8 C) 98.4 F (36.9 C)  TempSrc: Oral Oral Oral Oral  SpO2: 98% 98% 98% 96%  Weight:      Height:         Intake/Output Summary (Last 24 hours) at 09/03/2021 1457 Last data filed at 09/02/2021 2129 Gross per 24 hour  Intake 3  ml  Output --  Net 3 ml    11/04 1901 - 11/06 0700 In: 6 [I.V.:6] Out: -   Filed Weights   08/30/21 0600 08/31/21 0331 09/01/21 0343  Weight: 70.3 kg 69.1 kg 70.2 kg    Data Reviewed: Basic Metabolic Panel: Recent Labs  Lab 08/28/21 0633 08/29/21 0350 08/30/21 0500 08/31/21 0500 09/01/21 0325 09/02/21 0254 09/03/21 0304  NA 141   < > 130* 130* 131* 131* 135  K 3.0*   < > 3.9 4.3 4.7 5.3* 5.1  CL 100   < > 97* 97* 100 100 106  CO2 28    < > 24 23 22 22  19*  GLUCOSE 151*   < > 257* 269* 157* 216* 127*  BUN 79*   < > 78* 80* 77* 77* 73*  CREATININE 3.88*   < > 4.60* 4.93* 4.61* 4.51* 4.12*  CALCIUM 9.1   < > 8.6* 8.8* 8.7* 9.0 8.9  MG 2.4  --   --   --   --   --   --   PHOS 4.1   < > 5.3* 6.2* 6.3* 6.2* 6.0*   < > = values in this interval not displayed.   Liver Function Tests: Recent Labs  Lab 08/30/21 0500 08/31/21 0500 09/01/21 0325 09/02/21 0254 09/03/21 0304  ALBUMIN 2.4* 2.4* 2.4* 2.5* 2.6*   No results for input(s): LIPASE, AMYLASE in the last 168 hours. No results for input(s): AMMONIA in the last 168 hours. CBC: Recent Labs  Lab 08/28/21 0633 08/29/21 0350 08/30/21 0500  WBC 9.2 9.6 9.2  HGB 9.3* 7.9* 7.8*  HCT 29.3* 24.9* 24.2*  MCV 93.9 93.6 94.2  PLT 206 201 199   Cardiac Enzymes: No results for input(s): CKTOTAL, CKMB, CKMBINDEX, TROPONINI in the last 168 hours.  BNP (last 3 results) Recent Labs    08/19/21 0728  BNP 430.0*    ProBNP (last 3 results) No results for input(s): PROBNP in the last 8760 hours.  CBG: Recent Labs  Lab 09/02/21 1149 09/02/21 1540 09/02/21 2126 09/03/21 0624 09/03/21 1333  GLUCAP 279* 205* 111* 106* 308*       Radiology Reports  No results found.     Antibiotics: Anti-infectives (From admission, onward)    Start     Dose/Rate Route Frequency Ordered Stop   08/28/21 1419  vancomycin variable dose per unstable renal function (pharmacist dosing)  Status:  Discontinued         Does not apply See admin instructions 08/28/21 1419 08/30/21 1442   08/26/21 1515  vancomycin (VANCOREADY) IVPB 1250 mg/250 mL        1,250 mg 166.7 mL/hr over 90 Minutes Intravenous  Once 08/26/21 1427 08/26/21 1913   08/26/21 1200  ceFEPIme (MAXIPIME) 2 g in sodium chloride 0.9 % 100 mL IVPB  Status:  Discontinued        2 g 200 mL/hr over 30 Minutes Intravenous Every 24 hours 08/25/21 1710 08/26/21 0811   08/26/21 0900  cefTRIAXone (ROCEPHIN) 2 g in sodium  chloride 0.9 % 100 mL IVPB  Status:  Discontinued        2 g 200 mL/hr over 30 Minutes Intravenous Every 24 hours 08/26/21 0811 08/30/21 1442   08/23/21 1000  linezolid (ZYVOX) tablet 600 mg  Status:  Discontinued        600 mg Oral Every 12 hours 08/22/21 1038 08/26/21 0959   08/23/21 0900  ceFEPIme (MAXIPIME) 2 g in sodium chloride 0.9 % 100  mL IVPB        2 g 200 mL/hr over 30 Minutes Intravenous Every 24 hours 08/23/21 0736 08/25/21 1900   08/19/21 0745  ceFEPIme (MAXIPIME) 2 g in sodium chloride 0.9 % 100 mL IVPB  Status:  Discontinued        2 g 200 mL/hr over 30 Minutes Intravenous Every 24 hours 08/19/21 0651 08/23/21 0736   08/19/21 0745  vancomycin (VANCOREADY) IVPB 750 mg/150 mL  Status:  Discontinued        750 mg 150 mL/hr over 60 Minutes Intravenous Every 48 hours 08/19/21 0651 08/22/21 1038   08/18/21 0945  fluconazole (DIFLUCAN) tablet 150 mg        150 mg Oral  Once 08/18/21 0853 08/18/21 0951         DVT prophylaxis: Heparin  Code Status: DNR  Family Communication: No family at bedside   Consultants: Nephrology  Procedures:     Objective    Physical Examination:  General-appears in no acute distress Heart-S1-S2, regular, no murmur auscultated Lungs-clear to auscultation bilaterally, no wheezing or crackles auscultated Abdomen-soft, nontender, no organomegaly Extremities-no edema in the lower extremities Neuro-alert, oriented x3, no focal deficit noted  Status is: Inpatient  Dispo: The patient is from: Home              Anticipated d/c is to: Home              Anticipated d/c date is: 09/04/2021              Patient currently not stable for discharge  Barrier to discharge-none  COVID-19 Labs  No results for input(s): DDIMER, FERRITIN, LDH, CRP in the last 72 hours.  Lab Results  Component Value Date   SARSCOV2NAA NEGATIVE 08/16/2021   [place] NEGATIVE 01/06/2021   [place] NEGATIVE 11/04/2020   [place] NEGATIVE 05/16/2020      Pressure Injury 08/26/21 Buttocks Right;Left Stage 1 -  Intact skin with non-blanchable redness of a localized area usually over a bony prominence. (Active)  08/26/21 0050  Location: Buttocks  Location Orientation: Right;Left  Staging: Stage 1 -  Intact skin with non-blanchable redness of a localized area usually over a bony prominence.  Wound Description (Comments):   Present on Admission: Yes        Recent Results (from the past 240 hour(s))  MRSA Next Gen by PCR, Nasal     Status: Abnormal   Collection Time: 08/26/21 12:49 AM   Specimen: Nasal Mucosa; Nasal Swab  Result Value Ref Range Status   MRSA by PCR Next Gen DETECTED (A) NOT DETECTED Final    Comment: RESULT CALLED TO, READ BACK BY AND VERIFIED WITH: RN DAVID S.  [id number] FCP (NOTE) The GeneXpert MRSA Assay (FDA approved for NASAL specimens only), is one component of a comprehensive MRSA colonization surveillance program. It is not intended to diagnose MRSA infection nor to guide or monitor treatment for MRSA infections. Test performance is not FDA approved in patients less than 87 years old. Performed at [facility], [place] [address] [place], [place] [address]   Respiratory (~20 pathogens) panel by PCR     Status: None   Collection Time: 08/26/21  9:31 AM   Specimen: Nasopharyngeal Swab; Respiratory  Result Value Ref Range Status   Adenovirus NOT DETECTED NOT DETECTED Final   Coronavirus 229E NOT DETECTED NOT DETECTED Final    Comment: (NOTE) The Coronavirus on the Respiratory Panel, DOES NOT test for the novel  Coronavirus (2019 nCoV)    Coronavirus HKU1 NOT DETECTED NOT DETECTED Final   Coronavirus NL63 NOT DETECTED NOT DETECTED Final   Coronavirus OC43 NOT DETECTED NOT DETECTED Final   Metapneumovirus NOT DETECTED NOT DETECTED Final   Rhinovirus / Enterovirus NOT DETECTED NOT DETECTED Final   Influenza A NOT DETECTED NOT DETECTED Final   Influenza B NOT DETECTED NOT DETECTED Final    Parainfluenza Virus 1 NOT DETECTED NOT DETECTED Final   Parainfluenza Virus 2 NOT DETECTED NOT DETECTED Final   Parainfluenza Virus 3 NOT DETECTED NOT DETECTED Final   Parainfluenza Virus 4 NOT DETECTED NOT DETECTED Final   Respiratory Syncytial Virus NOT DETECTED NOT DETECTED Final   Bordetella pertussis NOT DETECTED NOT DETECTED Final   Bordetella Parapertussis NOT DETECTED NOT DETECTED Final   Chlamydophila pneumoniae NOT DETECTED NOT DETECTED Final   Mycoplasma pneumoniae NOT DETECTED NOT DETECTED Final    Comment: Performed at [facility], [place] [address] [place], [place] [address]    [facility] If 7PM-7AM, please contact night-coverage at [email or web address], Office  [phone]   09/03/2021, 2:57 PM  LOS: 17 days

## 2021-09-04 LAB — GLUCOSE, CAPILLARY
Glucose-Capillary: 155 mg/dL — ABNORMAL HIGH (ref 70–99)
Glucose-Capillary: 150 mg/dL — ABNORMAL HIGH (ref 70–99)
Glucose-Capillary: 252 mg/dL — ABNORMAL HIGH (ref 70–99)
Glucose-Capillary: 142 mg/dL — ABNORMAL HIGH (ref 70–99)

## 2021-09-04 LAB — RENAL FUNCTION PANEL
Albumin: 2.4 g/dL — ABNORMAL LOW (ref 3.5–5.0)
Anion gap: 10 (ref 5–15)
BUN: 70 mg/dL — ABNORMAL HIGH (ref 8–23)
CO2: 19 mmol/L — ABNORMAL LOW (ref 22–32)
Calcium: 8.8 mg/dL — ABNORMAL LOW (ref 8.9–10.3)
Chloride: 105 mmol/L (ref 98–111)
Creatinine, Ser: 3.85 mg/dL — ABNORMAL HIGH (ref 0.44–1.00)
GFR, Estimated: 12 mL/min — ABNORMAL LOW (ref >60)
Glucose, Bld: 194 mg/dL — ABNORMAL HIGH (ref 70–99)
Phosphorus: 5.1 mg/dL — ABNORMAL HIGH (ref 2.5–4.6)
Potassium: 5.3 mmol/L — ABNORMAL HIGH (ref 3.5–5.1)
Sodium: 134 mmol/L — ABNORMAL LOW (ref 135–145)

## 2021-09-04 MED ORDER — SODIUM ZIRCONIUM CYCLOSILICATE 10 G PO PACK
10.0000 g | PACK | Freq: Once | ORAL | Status: AC
  Administered 2021-09-04: 10 g via ORAL
  Filled 2021-09-04: qty 1

## 2021-09-04 MED ORDER — SODIUM CHLORIDE 0.9 % IV SOLN: INTRAVENOUS | Status: AC

## 2021-09-04 NOTE — Progress Notes (Signed)
Inpatient Diabetes Program Recommendations  AACE/ADA: New Consensus Statement on Inpatient Glycemic Control   Target Ranges:  Prepandial:   less than 140 mg/dL      Peak postprandial:   less than 180 mg/dL (1-2 hours)      Critically ill patients:  140 - 180 mg/dL   Results for Koslowski, Geanna A "[name]" (MRN [id number]) as of 09/04/2021 11:32  Ref. Range 09/03/2021 06:24 09/03/2021 13:33 09/03/2021 16:12 09/03/2021 21:33 09/04/2021 06:12  Glucose-Capillary Latest Ref Range: 70 - 99 mg/dL 106 (H) 308 (H) 246 (H) 175 (H) 155 (H)    Review of Glycemic Control  Diabetes history: DM2 Outpatient Diabetes medications: Levemir 70 units QHS, Humalog 20-26 units TID with meals Current orders for Inpatient glycemic control: Levemir 17 units QHS, Novolog 0-9 units TID with meals, Novolog 0-5 units QHS, Novolog 2 units TID with meals   Inpatient Diabetes Program Recommendations:    Insulin: Please consider increasing meal coverage to Novolog 4 units TID with meals.  Thanks Barnie Alderman, RN, MSN, CDE Diabetes Coordinator Inpatient Diabetes Program [phone] (Team Pager from 8am to 5pm)

## 2021-09-04 NOTE — Progress Notes (Signed)
[facility] ROUNDING NOTE   Subjective:   No urine output is charted over 11/6.  She states that she has been urinating well.  Review of systems: Denies shortness of breath or chest pain Denies n/v Urinating without difficulty  Objective:  Vital signs in last 24 hours:  Temp:  [98 F (36.7 C)-98.7 F (37.1 C)] 98 F (36.7 C) (11/07 0400) Pulse Rate:  [72-90] 90 (11/07 0400) Resp:  [14-18] 18 (11/07 0400) BP: (113-154)/(41-63) 124/46 (11/07 0400) SpO2:  [96 %-100 %] 98 % (11/07 0400) Weight:  [70.2 kg] 70.2 kg (11/07 0610)  Weight change:  Filed Weights   08/31/21 0331 09/01/21 0343 09/04/21 0610  Weight: 69.1 kg 70.2 kg 70.2 kg    Physical exam  General adult female in bed in no acute distress HEENT normocephalic atraumatic extraocular movements intact sclera anicteric Neck supple trachea midline Lungs clear to auscultation bilaterally normal work of breathing at rest on room air Heart S1S2 no rub Abdomen soft nontender nondistended Extremities no edema  Psych normal mood and affect Neuro alert and oriented x 3 provides hx and follows commands Access dialysis catheter is out  Basic Metabolic Panel: Recent Labs  Lab 08/31/21 0500 09/01/21 0325 09/02/21 0254 09/03/21 0304 09/04/21 0300  NA 130* 131* 131* 135 134*  K 4.3 4.7 5.3* 5.1 5.3*  CL 97* 100 100 106 105  CO2 23 22 22  19* 19*  GLUCOSE 269* 157* 216* 127* 194*  BUN 80* 77* 77* 73* 70*  CREATININE 4.93* 4.61* 4.51* 4.12* 3.85*  CALCIUM 8.8* 8.7* 9.0 8.9 8.8*  PHOS 6.2* 6.3* 6.2* 6.0* 5.1*    Liver Function Tests: Recent Labs  Lab 08/31/21 0500 09/01/21 0325 09/02/21 0254 09/03/21 0304 09/04/21 0300  ALBUMIN 2.4* 2.4* 2.5* 2.6* 2.4*     CBC: Recent Labs  Lab 08/29/21 0350 08/30/21 0500  WBC 9.6 9.2  HGB 7.9* 7.8*  HCT 24.9* 24.2*  MCV 93.6 94.2  PLT 201 199    CBG: Recent Labs  Lab 09/03/21 0624 09/03/21 1333 09/03/21 1612 09/03/21 2133 09/04/21 0612  GLUCAP  106* 308* 246* 175* 155*    Microbiology: Results for orders placed or performed during the hospital encounter of 08/16/21  Resp Panel by RT-PCR (Flu A&B, Covid) Nasopharyngeal Swab     Status: None   Collection Time: 08/16/21  8:28 PM   Specimen: Nasopharyngeal Swab; Nasopharyngeal(NP) swabs in vial transport medium  Result Value Ref Range Status   SARS Coronavirus 2 by RT PCR NEGATIVE NEGATIVE Final    Comment: (NOTE) SARS-CoV-2 target nucleic acids are NOT DETECTED.  The SARS-CoV-2 RNA is generally detectable in upper respiratory specimens during the acute phase of infection. The lowest concentration of SARS-CoV-2 viral copies this assay can detect is 138 copies/mL. A negative result does not preclude SARS-Cov-2 infection and should not be used as the sole basis for treatment or other patient management decisions. A negative result may occur with  improper specimen collection/handling, submission of specimen other than nasopharyngeal swab, presence of viral mutation(s) within the areas targeted by this assay, and inadequate number of viral copies(<138 copies/mL). A negative result must be combined with clinical observations, patient history, and epidemiological information. The expected result is Negative.  Fact Sheet for Patients:  EntrepreneurPulse.com.au  Fact Sheet for Healthcare Providers:  IncredibleEmployment.be  This test is no t yet approved or cleared by the [place] FDA and  has been authorized for detection and/or diagnosis of SARS-CoV-2 by FDA under an Emergency  Use Authorization (EUA). This EUA will remain  in effect (meaning this test can be used) for the duration of the COVID-19 declaration under Section 564(b)(1) of the Act, 21 U.S.C.section 360bbb-3(b)(1), unless the authorization is terminated  or revoked sooner.       Influenza A by PCR NEGATIVE NEGATIVE Final   Influenza B by PCR NEGATIVE NEGATIVE Final     Comment: (NOTE) The Xpert Xpress SARS-CoV-2/FLU/RSV plus assay is intended as an aid in the diagnosis of influenza from Nasopharyngeal swab specimens and should not be used as a sole basis for treatment. Nasal washings and aspirates are unacceptable for Xpert Xpress SARS-CoV-2/FLU/RSV testing.  Fact Sheet for Patients: EntrepreneurPulse.com.au  Fact Sheet for Healthcare Providers: IncredibleEmployment.be  This test is not yet approved or cleared by the [place] FDA and has been authorized for detection and/or diagnosis of SARS-CoV-2 by FDA under an Emergency Use Authorization (EUA). This EUA will remain in effect (meaning this test can be used) for the duration of the COVID-19 declaration under Section 564(b)(1) of the Act, 21 U.S.C. section 360bbb-3(b)(1), unless the authorization is terminated or revoked.  Performed at [facility], [address] [place], [place] [address]   Urine Culture     Status: None   Collection Time: 08/17/21 11:00 AM   Specimen: Urine, Clean Catch  Result Value Ref Range Status   Specimen Description   Final    URINE, CLEAN CATCH Performed at [facility], [address] [place], [place] [address]    Special Requests   Final    NONE Performed at [facility], [address] [place], [place] [address]    Culture   Final    NO GROWTH Performed at [facility], [place] [address] [place], [place] [address]    Report Status 08/19/2021 FINAL  Final  Culture, blood (Routine X 2) w Reflex to ID Panel     Status: None   Collection Time: 08/19/21  7:28 AM   Specimen: BLOOD LEFT ARM  Result Value Ref Range Status   Specimen Description BLOOD LEFT ARM BOTTLES DRAWN AEROBIC AND ANAEROBIC  Final   Special Requests   Final    Blood Culture results may not be optimal due to an excessive volume of blood received in culture bottles   Culture   Final    NO GROWTH 5 DAYS Performed at [facility], [address] [place], [place] [address]    Report Status 08/24/2021 FINAL  Final  Culture, blood (Routine X 2) w Reflex to ID Panel     Status: None   Collection Time: 08/19/21  8:27 AM   Specimen: BLOOD LEFT HAND  Result Value Ref Range Status   Specimen Description   Final    BLOOD LEFT HAND BOTTLES DRAWN AEROBIC AND ANAEROBIC   Special Requests Blood Culture adequate volume  Final   Culture   Final    NO GROWTH 5 DAYS Performed at [facility], [address], [place],  [address]    Report Status 08/24/2021 FINAL  Final  MRSA Next Gen by PCR, Nasal     Status: Abnormal   Collection Time: 08/21/21  5:40 PM   Specimen: Nasal Mucosa; Nasal Swab  Result Value Ref Range Status   MRSA by PCR Next Gen DETECTED (A) NOT DETECTED Final    Comment: RESULT CALLED TO, READ BACK BY AND VERIFIED WITH: JESSICA HEARN RN,2021,08/21/2021, SELF S. (NOTE) The GeneXpert MRSA Assay (FDA approved for NASAL specimens only),  is one component of a comprehensive MRSA colonization surveillance program. It is not intended to diagnose MRSA infection nor to guide or monitor treatment for MRSA infections. Test performance is not FDA approved in patients less than 50 years old. Performed at [facility], [address], [place], [place] [address]   MRSA Next Gen by PCR, Nasal     Status: Abnormal   Collection Time: 08/26/21 12:49 AM   Specimen: Nasal Mucosa; Nasal Swab  Result Value Ref Range Status   MRSA by PCR Next Gen DETECTED (A) NOT DETECTED Final    Comment: RESULT CALLED TO, READ BACK BY AND VERIFIED WITH: RN DAVID S.  [id number] FCP (NOTE) The GeneXpert MRSA Assay (FDA approved for NASAL specimens only), is one component of a comprehensive MRSA colonization surveillance program. It is not intended to diagnose MRSA infection nor to guide or monitor treatment for MRSA infections. Test performance is not FDA approved in patients less than 81 years old. Performed at [facility], [place] [address],  [place], [place] [address]   Respiratory (~20 pathogens) panel by PCR     Status: None   Collection Time: 08/26/21  9:31 AM   Specimen: Nasopharyngeal Swab; Respiratory  Result Value Ref Range Status   Adenovirus NOT DETECTED NOT DETECTED Final   Coronavirus 229E NOT DETECTED NOT DETECTED Final    Comment: (NOTE) The Coronavirus on the Respiratory Panel, DOES NOT test for the novel  Coronavirus (2019 nCoV)    Coronavirus HKU1 NOT DETECTED NOT DETECTED Final   Coronavirus NL63 NOT DETECTED NOT DETECTED Final   Coronavirus OC43 NOT DETECTED NOT DETECTED Final   Metapneumovirus NOT DETECTED NOT DETECTED Final   Rhinovirus / Enterovirus NOT DETECTED NOT DETECTED Final   Influenza A NOT DETECTED NOT DETECTED Final   Influenza B NOT DETECTED NOT DETECTED Final   Parainfluenza Virus 1 NOT DETECTED NOT DETECTED Final   Parainfluenza Virus 2 NOT DETECTED NOT DETECTED Final   Parainfluenza Virus 3 NOT DETECTED NOT DETECTED Final   Parainfluenza Virus 4 NOT DETECTED NOT DETECTED Final   Respiratory Syncytial Virus NOT DETECTED NOT DETECTED Final   Bordetella pertussis NOT DETECTED NOT DETECTED Final   Bordetella Parapertussis NOT DETECTED NOT DETECTED Final   Chlamydophila pneumoniae NOT DETECTED NOT DETECTED Final   Mycoplasma pneumoniae NOT DETECTED NOT DETECTED Final    Comment: Performed at [facility], [place]. [address], [place], [place] [address]    Imaging: No results found.   Medications:    sodium chloride 250 mL (08/26/21 1235)    allopurinol  100 mg Oral Daily   aspirin EC  81 mg Oral Daily   atorvastatin  80 mg Oral QHS   Chlorhexidine Gluconate Cloth  6 each Topical Daily   diltiazem  60 mg Oral Q8H   feeding supplement (GLUCERNA SHAKE)  237 mL Oral TID BM   heparin injection (subcutaneous)  5,000 Units Subcutaneous Q8H   insulin aspart  0-5 Units Subcutaneous QHS   insulin aspart  0-9 Units Subcutaneous TID WC   insulin aspart  2 Units Subcutaneous TID WC   insulin  detemir  17 Units Subcutaneous QHS   labetalol  200 mg Oral BID   lipase/protease/amylase  36,000-72,000 Units Oral See admin instructions   multivitamin with minerals  1 tablet Oral Daily   pantoprazole  40 mg Oral Daily   sodium chloride flush  3 mL Intravenous Q12H   sodium chloride, acetaminophen, cyclobenzaprine, diphenhydrAMINE, diphenoxylate-atropine, haloperidol  lactate, HYDROmorphone, ipratropium-albuterol, LORazepam, meclizine, ondansetron **OR** ondansetron (ZOFRAN) IV, sodium chloride flush, temazepam  Assessment/ Plan:  AKI/CKD stage IIIb - in setting of PNA and hypertensive urgency and chronic diarrhea.  Baseline Scr variable 1.6-2.2 and follows Dr. Theador Hawthorne as an outpatient. Required HD 10/25 due to hyperkalemia.  Initial AKI improved and worsened between 10/30 and 11/3. Now improving again Serologies negative    Urine output not documented.  I have re-ordered strict ins/outs Her second AKI was likely related to hemodynamic changes due to fluctuating Bps. Now slowly improving after decreasing HTN meds Temporary HD catheter removed NS at 75/hr x 12 hours Lokelma once today Changed to renal DM diet Acute hypoxic respiratory failure - due to multifocal pneumonia vs CHF.  S/p antibiotics. V/Q negative. Much improved. Careful with hydration HTN urgency/emergency -much improved at this time.  Hydralazine was stopped due to intermittently low blood pressures.   AMS  much improved; multifactorial Anemia of critical illness - s/p transfusion.  CBC in AM  Thrombocytopenia -now normalized.  Possibly was related to hypertension   Claudia Desanctis, MD 09/04/2021 7:18 AM

## 2021-09-04 NOTE — Progress Notes (Signed)
PROGRESS NOTE    Heather [name]  RDE:[id number] DOB: 05/21/1951 DOA: 08/16/2021 PCP: Heather Blitz, MD   Chief Complaint  Patient presents with   Blurred Vision  Brief Narrative/Hospital Course:  Heather [name], 70 y.o. female with PMH of CKD stage IIIb, Crohn's disease, hypertension, diabetes mellitus type 2, CVA presented with blurry vision x 2 days w/ stabbing headache, nausea vomiting. She was seen in the ED, underwent- Brain MRI/MRA showed no acute changes.Further work-up with CT chest showed multifocal pneumonia, bilateral pleural effusion, pulmonary edema.  Patient was started on IV antibiotics and also received furosemide but had worsening renal function requiring renal replacement therapy x2. Patient was treated with ceftriaxone and vancomycin for atypical infection, before that she was on cefepime and linezolid.After 9 days of therapy her antibiotics have been discontinued.  She is followed very closely by nephrology.  She continues to do well.   Subjective: Examined this morning resting comfortably.  She is alert awake oriented to self current place.  When asked about president " just forgot but I know who is it"-when prompted able to tell its Heather [name]. Offer no new complaints. Creatinine better potassium high this morning urine output not charted  Assessment & Plan:  AKI on CKD stage IIIb: Baseline creatinine 1.6-2.2 followed by Heather [name]. AKI in the setting of pneumonia, hypertension urgency chronic diarrhea. None required HD 10/25 due to hyperkalemia, AKI improved but worsened on 10/30 and 11/3. so far serologies negative, second AKI likely due to hemodynamic changes due to fluctuating BPs.  Appreciate nephrology input hypertension meds decreased.  Starting IV fluids x12 hours Lokelma x1 today and renal diet.  Monitor intake output and BMP.  Temporary HD catheter removed 11/6 Recent Labs  Lab 08/31/21 0500 09/01/21 0325 09/02/21 0254 09/03/21 0304 09/04/21 0300   BUN 80* 77* 77* 73* 70*  CREATININE 4.93* 4.61* 4.51* 4.12* 3.85*   Hyperkalemia change to renal diet, Lokelma x1 this morning.  Per nephrology. Recent Labs  Lab 08/31/21 0500 09/01/21 0325 09/02/21 0254 09/03/21 0304 09/04/21 0300  K 4.3 4.7 5.3* 5.1 5.3*    Acute hypoxic respiratory failure due to multifocal pneumonia  vs CHF: Resolved.  Off oxygen.  Echo showed normal EF 60 to 65% with no regional wall motion abnormalities and LV diastolic parameters were normal.  Multifocal pneumonia: Completed antibiotics.  Afebrile. Last wbc counts normal Recent Labs  Lab 08/29/21 0350 08/30/21 0500  WBC 9.6 9.2    Hypertension urgency/emergency: Leading to renal failure as well blood pressure stable off hydralazine.  Monitor  Acute metabolic encephalopathy: Multifactorial, resolved, although she does appear to have mild cognitive issues and seems to be interactive following commands appropriately.  Anemia of critical illness: S/P transfusion.  Check CBC in a.m. Recent Labs  Lab 08/29/21 0350 08/30/21 0500  HGB 7.9* 7.8*  HCT 24.9* 24.2*    Thrombocytopenia resolved  Blurry vision, bilateral: Brain MRI/MRA no acute changes.  Resolved  Crohn's disease on immunosuppressive with Humira at home.  Stable  Type 2 diabetes: Blood sugar well controlled on sliding scale insulin last  A1c stable 6.1 on 10/30 Recent Labs  Lab 09/03/21 0624 09/03/21 1333 09/03/21 1612 09/03/21 2133 09/04/21 0612  GLUCAP 106* 308* 246* 175* 155*   Pressure ulcer of the right buttock stage I POA as below Pressure Injury 08/26/21 Buttocks Right;Left Stage 1 -  Intact skin with non-blanchable redness of a localized area usually over a bony prominence. (Active)  08/26/21 0050  Location: Buttocks  Location  Orientation: Right;Left  Staging: Stage 1 -  Intact skin with non-blanchable redness of a localized area usually over a bony prominence.  Wound Description (Comments):   Present on Admission: Yes     DVT prophylaxis: heparin injection 5,000 Units Start: 08/31/21 1400 Place and maintain sequential compression device Start: 08/23/21 1155 Code Status:   Code Status: DNR Family Communication: plan of care discussed with patient at bedside. Status is: Inpatient Remains inpatient appropriate because: For ongoing management of renal failure pending clearance by nephrology.  Objective: Vitals last 24 hrs: Vitals:   09/03/21 2336 09/04/21 0400 09/04/21 0610 09/04/21 0754  BP: (!) 154/63 (!) 124/46  (!) 119/45  Pulse: 87 90  70  Resp: 18 18  16   Temp: 98.6 F (37 C) 98 F (36.7 C)  98.4 F (36.9 C)  TempSrc: Oral Oral  Oral  SpO2: 98% 98%  97%  Weight:   70.2 kg   Height:       Weight change:   Intake/Output Summary (Last 24 hours) at 09/04/2021 0254 Last data filed at 09/03/2021 2142 Gross per 24 hour  Intake 483 ml  Output --  Net 483 ml   Net IO Since Admission: 599.95 mL [09/04/21 0905]   Physical Examination: General exam: Aa0x2-3, weak,older than stated age. HEENT:Oral mucosa moist, Ear/Nose WNL grossly,dentition normal. Respiratory system: B/l clear BS, no use of accessory muscle, non tender. Cardiovascular system: S1 & S2 +,No JVD. Gastrointestinal system: Abdomen soft, NT,ND, BS+. Nervous System:Alert, awake, moving extremities. Extremities: edema none, distal peripheral pulses palpable.  Skin: No rashes, no icterus. MSK: Normal muscle bulk, tone, power.  Medications reviewed:  Scheduled Meds:  allopurinol  100 mg Oral Daily   aspirin EC  81 mg Oral Daily   atorvastatin  80 mg Oral QHS   Chlorhexidine Gluconate Cloth  6 each Topical Daily   diltiazem  60 mg Oral Q8H   feeding supplement (GLUCERNA SHAKE)  237 mL Oral TID BM   heparin injection (subcutaneous)  5,000 Units Subcutaneous Q8H   insulin aspart  0-5 Units Subcutaneous QHS   insulin aspart  0-9 Units Subcutaneous TID WC   insulin aspart  2 Units Subcutaneous TID WC   insulin detemir  17 Units  Subcutaneous QHS   labetalol  200 mg Oral BID   lipase/protease/amylase  36,000-72,000 Units Oral See admin instructions   multivitamin with minerals  1 tablet Oral Daily   pantoprazole  40 mg Oral Daily   sodium chloride flush  3 mL Intravenous Q12H   sodium zirconium cyclosilicate  10 g Oral Once   Continuous Infusions:  sodium chloride 250 mL (08/26/21 1235)   sodium chloride        Diet Order             Diet renal/carb modified with fluid restriction Diet-HS Snack? Nothing; Fluid restriction: Other (see comments); Room service appropriate? Yes; Fluid consistency: Thin  Diet effective now                   Nutrition Problem: Inadequate oral intake Etiology: decreased appetite Signs/Symptoms: per patient/family report Interventions: Glucerna shake, MVI  Weight change:   Wt Readings from Last 3 Encounters:  09/04/21 70.2 kg  08/03/21 67 kg  07/19/21 67.5 kg     Consultants:see note  Procedures:see note Antimicrobials: Anti-infectives (From admission, onward)    Start     Dose/Rate Route Frequency Ordered Stop   08/28/21 1419  vancomycin variable dose per unstable renal function (  pharmacist dosing)  Status:  Discontinued         Does not apply See admin instructions 08/28/21 1419 08/30/21 1442   08/26/21 1515  vancomycin (VANCOREADY) IVPB 1250 mg/250 mL        1,250 mg 166.7 mL/hr over 90 Minutes Intravenous  Once 08/26/21 1427 08/26/21 1913   08/26/21 1200  ceFEPIme (MAXIPIME) 2 g in sodium chloride 0.9 % 100 mL IVPB  Status:  Discontinued        2 g 200 mL/hr over 30 Minutes Intravenous Every 24 hours 08/25/21 1710 08/26/21 0811   08/26/21 0900  cefTRIAXone (ROCEPHIN) 2 g in sodium chloride 0.9 % 100 mL IVPB  Status:  Discontinued        2 g 200 mL/hr over 30 Minutes Intravenous Every 24 hours 08/26/21 0811 08/30/21 1442   08/23/21 1000  linezolid (ZYVOX) tablet 600 mg  Status:  Discontinued        600 mg Oral Every 12 hours 08/22/21 1038 08/26/21 0959    08/23/21 0900  ceFEPIme (MAXIPIME) 2 g in sodium chloride 0.9 % 100 mL IVPB        2 g 200 mL/hr over 30 Minutes Intravenous Every 24 hours 08/23/21 0736 08/25/21 1900   08/19/21 0745  ceFEPIme (MAXIPIME) 2 g in sodium chloride 0.9 % 100 mL IVPB  Status:  Discontinued        2 g 200 mL/hr over 30 Minutes Intravenous Every 24 hours 08/19/21 0651 08/23/21 0736   08/19/21 0745  vancomycin (VANCOREADY) IVPB 750 mg/150 mL  Status:  Discontinued        750 mg 150 mL/hr over 60 Minutes Intravenous Every 48 hours 08/19/21 0651 08/22/21 1038   08/18/21 0945  fluconazole (DIFLUCAN) tablet 150 mg        150 mg Oral  Once 08/18/21 0853 08/18/21 0951      Culture/Microbiology    Component Value Date/Time   SDES  08/19/2021 0827    BLOOD LEFT HAND BOTTLES DRAWN AEROBIC AND ANAEROBIC   SPECREQUEST Blood Culture adequate volume 08/19/2021 0827   CULT  08/19/2021 0827    NO GROWTH 5 DAYS Performed at [facility], [address], [place], [place] [address]    REPTSTATUS 08/24/2021 FINAL 08/19/2021 0827    Other culture-see note  Unresulted Labs (From admission, onward)     Start     Ordered   09/05/21 0500  CBC  Tomorrow morning,   R       Question:  Specimen collection method  Answer:  Unit=Unit collect   09/04/21 0726   08/22/21 0500  Renal function panel  Daily,   R     Question:  Specimen collection method  Answer:  Lab=Lab collect   08/21/21 0904          Data Reviewed: I have personally reviewed following labs and imaging studies CBC: Recent Labs  Lab 08/29/21 0350 08/30/21 0500  WBC 9.6 9.2  HGB 7.9* 7.8*  HCT 24.9* 24.2*  MCV 93.6 94.2  PLT 201 130   Basic Metabolic Panel: Recent Labs  Lab 08/31/21 0500 09/01/21 0325 09/02/21 0254 09/03/21 0304 09/04/21 0300  NA 130* 131* 131* 135 134*  K 4.3 4.7 5.3* 5.1 5.3*  CL 97* 100 100 106 105  CO2 23 22 22  19* 19*  GLUCOSE 269* 157* 216* 127* 194*  BUN 80* 77* 77* 73* 70*  CREATININE 4.93* 4.61* 4.51* 4.12* 3.85*   CALCIUM 8.8* 8.7* 9.0 8.9 8.8*  PHOS 6.2* 6.3* 6.2* 6.0* 5.1*   GFR: Estimated Creatinine Clearance: 13.3 mL/min (A) (by C-G formula based on SCr of 3.85 mg/dL (H)). Liver Function Tests: Recent Labs  Lab 08/31/21 0500 09/01/21 0325 09/02/21 0254 09/03/21 0304 09/04/21 0300  ALBUMIN 2.4* 2.4* 2.5* 2.6* 2.4*   No results for input(s): LIPASE, AMYLASE in the last 168 hours. No results for input(s): AMMONIA in the last 168 hours. Coagulation Profile: No results for input(s): INR, PROTIME in the last 168 hours. Cardiac Enzymes: No results for input(s): CKTOTAL, CKMB, CKMBINDEX, TROPONINI in the last 168 hours. BNP (last 3 results) No results for input(s): PROBNP in the last 8760 hours. HbA1C: No results for input(s): HGBA1C in the last 72 hours. CBG: Recent Labs  Lab 09/03/21 0624 09/03/21 1333 09/03/21 1612 09/03/21 2133 09/04/21 0612  GLUCAP 106* 308* 246* 175* 155*   Lipid Profile: No results for input(s): CHOL, HDL, LDLCALC, TRIG, CHOLHDL, LDLDIRECT in the last 72 hours. Thyroid Function Tests: No results for input(s): TSH, T4TOTAL, FREET4, T3FREE, THYROIDAB in the last 72 hours. Anemia Panel: No results for input(s): VITAMINB12, FOLATE, FERRITIN, TIBC, IRON, RETICCTPCT in the last 72 hours. Sepsis Labs: No results for input(s): PROCALCITON, LATICACIDVEN in the last 168 hours.  Recent Results (from the past 240 hour(s))  MRSA Next Gen by PCR, Nasal     Status: Abnormal   Collection Time: 08/26/21 12:49 AM   Specimen: Nasal Mucosa; Nasal Swab  Result Value Ref Range Status   MRSA by PCR Next Gen DETECTED (A) NOT DETECTED Final    Comment: RESULT CALLED TO, READ BACK BY AND VERIFIED WITH: RN DAVID S.  [id number] FCP (NOTE) The GeneXpert MRSA Assay (FDA approved for NASAL specimens only), is one component of a comprehensive MRSA colonization surveillance program. It is not intended to diagnose MRSA infection nor to guide or monitor treatment for MRSA  infections. Test performance is not FDA approved in patients less than 64 years old. Performed at [facility], [place] [address] [place], [place] [address]   Respiratory (~20 pathogens) panel by PCR     Status: None   Collection Time: 08/26/21  9:31 AM   Specimen: Nasopharyngeal Swab; Respiratory  Result Value Ref Range Status   Adenovirus NOT DETECTED NOT DETECTED Final   Coronavirus 229E NOT DETECTED NOT DETECTED Final    Comment: (NOTE) The Coronavirus on the Respiratory Panel, DOES NOT test for the novel  Coronavirus (2019 nCoV)    Coronavirus HKU1 NOT DETECTED NOT DETECTED Final   Coronavirus NL63 NOT DETECTED NOT DETECTED Final   Coronavirus OC43 NOT DETECTED NOT DETECTED Final   Metapneumovirus NOT DETECTED NOT DETECTED Final   Rhinovirus / Enterovirus NOT DETECTED NOT DETECTED Final   Influenza A NOT DETECTED NOT DETECTED Final   Influenza B NOT DETECTED NOT DETECTED Final   Parainfluenza Virus 1 NOT DETECTED NOT DETECTED Final   Parainfluenza Virus 2 NOT DETECTED NOT DETECTED Final   Parainfluenza Virus 3 NOT DETECTED NOT DETECTED Final   Parainfluenza Virus 4 NOT DETECTED NOT DETECTED Final   Respiratory Syncytial Virus NOT DETECTED NOT DETECTED Final   Bordetella pertussis NOT DETECTED NOT DETECTED Final   Bordetella Parapertussis NOT DETECTED NOT DETECTED Final   Chlamydophila pneumoniae NOT DETECTED NOT DETECTED Final   Mycoplasma pneumoniae NOT DETECTED NOT DETECTED Final    Comment: Performed at [facility], [place]. [address], [place],  [address]     Radiology Studies: No results found.  LOS: 18 days   Antonieta Pert, MD [facility]  09/04/2021, 9:05 AM

## 2021-09-04 NOTE — Progress Notes (Signed)
Mobility Specialist: Progress Note   09/04/21 1453  Mobility  Activity Ambulated in hall  Level of Assistance Contact guard assist, steadying assist  Assistive Device Front wheel walker  Distance Ambulated (ft) 240 ft  Mobility Ambulated with assistance in hallway  Mobility Response Tolerated well  Mobility performed by Mobility specialist  $Mobility charge 1 Mobility   Pt independent to EOB as well as to stand. Pt c/o minimal back pain during ambulation, otherwise asymptomatic. Pt back to bed after walk with call bell and phone at her side. Bed alarm is on.   [facility] Heather [name] Mobility Specialist Mobility Specialist Phone: [phone]

## 2021-09-04 NOTE — Progress Notes (Signed)
Physical Therapy Treatment Patient Details Name: DESHON KOSLOWSKI MRN: [id number] DOB: 1951-07-22 Today's Date: 09/04/2021   History of Present Illness Pt is a 70 y.o. female who presented 08/16/21 with blurred vision, HTN, and headache. Found to have altered mental status with AKI on CKD, needed dialysis on 10/23. EEG mild diffuse slowing, MRI on 10/19 no acute stroke, MRA left ACA stenosis, MRV negative. However on 10/30 MRI showed left cerebellum and left frontal small/punctate infarcts. S/p temporary dialysis central line placement R internal jugular 10/23. PMH: CKD 3, Crohn's disease, hypertension, diabetes mellitus, BPPV stroke    PT Comments    The pt was able to demo good progress with mobility at this time, but continues to demo cognitive deficits that impact her ability to safely mobilize without minG for safety. The pt was able to follow all simple commands, but demos deficits in STM, and all dual task (cog + mobility). She does show awareness at this time of her deficits, but still benefits from cues to correct or manage deficits. The pt did not c/o dizziness with mobility at this time, will continue to benefit from skilled PT to further progress ambulation endurance and stability to improve safety with mobility prior to return home.     Recommendations for follow up therapy are one component of a multi-disciplinary discharge planning process, led by the attending physician.  Recommendations may be updated based on patient status, additional functional criteria and insurance authorization.  Follow Up Recommendations  Home health PT     Assistance Recommended at Discharge Frequent or constant Supervision/Assistance  Equipment Recommendations  None recommended by PT    Recommendations for Other Services       Precautions / Restrictions Precautions Precautions: Fall;Other (comment) Precaution Comments: monitor BP Restrictions Weight Bearing Restrictions: No     Mobility   Bed Mobility Overal bed mobility: Needs Assistance Bed Mobility: Supine to Sit     Supine to sit: Supervision     General bed mobility comments: pt able to complete without assist, did use bed rail Patient Response: Restless  Transfers Overall transfer level: Needs assistance Equipment used: Rolling walker (2 wheels) Transfers: Sit to/from Stand Sit to Stand: Supervision           General transfer comment: supervision from EOB and low toilet    Ambulation/Gait Ambulation/Gait assistance: Min guard;Min assist Gait Distance (Feet): 200 Feet Assistive device: Rolling walker (2 wheels);None Gait Pattern/deviations: Step-through pattern;Decreased stride length;Narrow base of support Gait velocity: 0.36 m/s Gait velocity interpretation: <1.31 ft/sec, indicative of household ambulator   General Gait Details: slow but steady with hallway ambulation, but demos poor ability to maintain cognitive task with mobility.      Modified Rankin (Stroke Patients Only) Modified Rankin (Stroke Patients Only) Pre-Morbid Rankin Score: No symptoms Modified Rankin: Moderately severe disability     Balance Overall balance assessment: Needs assistance Sitting-balance support: No upper extremity supported;Feet supported Sitting balance-Leahy Scale: Good     Standing balance support: Reliant on assistive device for balance Standing balance-Leahy Scale: Poor Standing balance comment: UE support on RW.                            Cognition Arousal/Alertness: Awake/alert Behavior During Therapy: WFL for tasks assessed/performed Overall Cognitive Status: Impaired/Different from baseline Area of Impairment: Attention;Orientation;Memory;Following commands;Safety/judgement;Awareness;Problem solving                 Orientation Level: Disoriented to;Place (asked if we  were still in [place] originally) Current Attention Level: Sustained Memory: Decreased short-term  memory Following Commands: Follows one step commands consistently;Follows multi-step commands inconsistently Safety/Judgement: Decreased awareness of safety;Decreased awareness of deficits Awareness: Emergent Problem Solving: Slow processing General Comments: pt able to follow cues for tasks, but was unable to complete memory or sequencing tasks while ambulating. pt accurately remembered 0/3 objects after 10 min, unable to name animals in alphabetical order or accurately complete simple math with mobility or while standing still.        Exercises      General Comments General comments (skin integrity, edema, etc.): VSS on RA      Pertinent Vitals/Pain Pain Assessment: Faces Faces Pain Scale: Hurts a little bit Pain Location: headache and upper back Pain Descriptors / Indicators: Discomfort;Grimacing;Guarding Pain Intervention(s): Limited activity within patient's tolerance;Monitored during session;Repositioned;Patient requesting pain meds-RN notified     PT Goals (current goals can now be found in the care plan section) Acute Rehab PT Goals Patient Stated Goal: to get better to go home PT Goal Formulation: With patient Time For Goal Achievement: 09/13/21 Potential to Achieve Goals: Good Progress towards PT goals: Progressing toward goals    Frequency    Min 4X/week      PT Plan Current plan remains appropriate       AM-PAC PT "6 Clicks" Mobility   Outcome Measure  Help needed turning from your back to your side while in a flat bed without using bedrails?: A Little Help needed moving from lying on your back to sitting on the side of a flat bed without using bedrails?: A Little Help needed moving to and from a bed to a chair (including a wheelchair)?: A Little Help needed standing up from a chair using your arms (e.g., wheelchair or bedside chair)?: A Little Help needed to walk in hospital room?: A Little Help needed climbing 3-5 steps with a railing? : A Little 6  Click Score: 18    End of Session Equipment Utilized During Treatment: Gait belt Activity Tolerance: Patient tolerated treatment well Patient left: in bed;with call bell/phone within reach;with bed alarm set (pt sitting EOB) Nurse Communication: Mobility status;Patient requests pain meds PT Visit Diagnosis: Unsteadiness on feet (R26.81);Other abnormalities of gait and mobility (R26.89);Muscle weakness (generalized) (M62.81);History of falling (Z91.81);Difficulty in walking, not elsewhere classified (R26.2);Dizziness and giddiness (R42)     Time: 5400-8676 PT Time Calculation (min) (ACUTE ONLY): 24 min  Charges:  $Gait Training: 23-37 mins                     West Carbo, PT, DPT   Acute Rehabilitation Department Pager #: [phone]   Sandra Cockayne 09/04/2021, 11:52 AM

## 2021-09-05 LAB — RENAL FUNCTION PANEL
Albumin: 2.7 g/dL — ABNORMAL LOW (ref 3.5–5.0)
Anion gap: 11 (ref 5–15)
BUN: 68 mg/dL — ABNORMAL HIGH (ref 8–23)
CO2: 20 mmol/L — ABNORMAL LOW (ref 22–32)
Calcium: 8.9 mg/dL (ref 8.9–10.3)
Chloride: 103 mmol/L (ref 98–111)
Creatinine, Ser: 3.79 mg/dL — ABNORMAL HIGH (ref 0.44–1.00)
GFR, Estimated: 12 mL/min — ABNORMAL LOW (ref >60)
Glucose, Bld: 183 mg/dL — ABNORMAL HIGH (ref 70–99)
Phosphorus: 5.3 mg/dL — ABNORMAL HIGH (ref 2.5–4.6)
Potassium: 5.4 mmol/L — ABNORMAL HIGH (ref 3.5–5.1)
Sodium: 134 mmol/L — ABNORMAL LOW (ref 135–145)

## 2021-09-05 LAB — GLUCOSE, CAPILLARY
Glucose-Capillary: 189 mg/dL — ABNORMAL HIGH (ref 70–99)
Glucose-Capillary: 98 mg/dL (ref 70–99)
Glucose-Capillary: 226 mg/dL — ABNORMAL HIGH (ref 70–99)
Glucose-Capillary: 153 mg/dL — ABNORMAL HIGH (ref 70–99)

## 2021-09-05 LAB — IRON AND TIBC
Iron: 69 ug/dL (ref 28–170)
Saturation Ratios: 31 % (ref 10.4–31.8)
TIBC: 223 ug/dL — ABNORMAL LOW (ref 250–450)
UIBC: 154 ug/dL

## 2021-09-05 LAB — CBC
HCT: 23.8 % — ABNORMAL LOW (ref 36.0–46.0)
Hemoglobin: 7.3 g/dL — ABNORMAL LOW (ref 12.0–15.0)
MCH: 30 pg (ref 26.0–34.0)
MCHC: 30.7 g/dL (ref 30.0–36.0)
MCV: 97.9 fL (ref 80.0–100.0)
Platelets: 246 10*3/uL (ref 150–400)
RBC: 2.43 MIL/uL — ABNORMAL LOW (ref 3.87–5.11)
RDW: 15.8 % — ABNORMAL HIGH (ref 11.5–15.5)
WBC: 5.3 10*3/uL (ref 4.0–10.5)
nRBC: 0 % (ref 0.0–0.2)

## 2021-09-05 LAB — FERRITIN: Ferritin: 517 ng/mL — ABNORMAL HIGH (ref 11–307)

## 2021-09-05 LAB — PREPARE RBC (CROSSMATCH)

## 2021-09-05 MED ORDER — NEPRO/CARBSTEADY PO LIQD: 237.0000 mL | Freq: Three times a day (TID) | ORAL | Status: DC

## 2021-09-05 MED ORDER — INSULIN ASPART 100 UNIT/ML IJ SOLN
4.0000 [IU] | Freq: Three times a day (TID) | INTRAMUSCULAR | Status: DC
  Administered 2021-09-05 (×3): 4 [IU] via SUBCUTANEOUS

## 2021-09-05 MED ORDER — SODIUM CHLORIDE 0.9% IV SOLUTION: Freq: Once | INTRAVENOUS | Status: AC

## 2021-09-05 MED ORDER — SODIUM ZIRCONIUM CYCLOSILICATE 10 G PO PACK
10.0000 g | PACK | Freq: Once | ORAL | Status: AC
  Administered 2021-09-05: 10 g via ORAL
  Filled 2021-09-05: qty 1

## 2021-09-05 MED ORDER — DARBEPOETIN ALFA 40 MCG/0.4ML IJ SOSY
40.0000 ug | PREFILLED_SYRINGE | Freq: Once | INTRAMUSCULAR | Status: AC
  Administered 2021-09-05: 40 ug via SUBCUTANEOUS
  Filled 2021-09-05: qty 0.4

## 2021-09-05 MED ORDER — SODIUM CHLORIDE 0.9 % IV SOLN: INTRAVENOUS | Status: AC

## 2021-09-05 NOTE — Progress Notes (Signed)
Occupational Therapy Treatment Patient Details Name: Heather [name] MRN: [id number] DOB: 1951/10/17 Today's Date: 09/05/2021   History of present illness Pt is a 70 y.o. female who presented 08/16/21 with blurred vision, HTN, and headache. Found to have altered mental status with AKI on CKD, needed dialysis on 10/23. EEG mild diffuse slowing, MRI on 10/19 no acute stroke, MRA left ACA stenosis, MRV negative. However on 10/30 MRI showed left cerebellum and left frontal small/punctate infarcts. S/p temporary dialysis central line placement R internal jugular 10/23. PMH: CKD 3, Crohn's disease, hypertension, diabetes mellitus, BPPV stroke   OT comments  Pt progressing towards acute OT goals. Pt able to walk to bathroom to toilet and complete pericare then stood at sink for 1 grooming tasks. Pt is min A for transfers/mobility with no AD, min guard with rw. Pt c/o lightheadedness while standing at sink. BP assessed and VSS; pt reports not eating breakfast yet. Up to recliner at end of session. D/c plan remains appropriate.    Recommendations for follow up therapy are one component of a multi-disciplinary discharge planning process, led by the attending physician.  Recommendations may be updated based on patient status, additional functional criteria and insurance authorization.    Follow Up Recommendations  Home health OT    Assistance Recommended at Discharge Intermittent Supervision/Assistance  Equipment Recommendations  None recommended by OT    Recommendations for Other Services      Precautions / Restrictions Precautions Precautions: Fall;Other (comment) Precaution Comments: monitor BP Restrictions Weight Bearing Restrictions: No       Mobility Bed Mobility               General bed mobility comments: EOB with nursing at start of session    Transfers Overall transfer level: Needs assistance Equipment used: Rolling walker (2 wheels);1 person hand held assist   Sit to  Stand: Min guard;Min assist           General transfer comment: Observed to stand from EOB with nursing staff with +1 HHA. Pt seeking external support walking to bathroom. Utilized rw and min guard assist for subsequent transfers. to/from EOB, comfort height toilet, and recliner.     Balance Overall balance assessment: Needs assistance Sitting-balance support: Feet supported;Single extremity supported;No upper extremity supported Sitting balance-Leahy Scale: Good     Standing balance support: Reliant on assistive device for balance Standing balance-Leahy Scale: Poor Standing balance comment: seeks external support in standing                           ADL either performed or assessed with clinical judgement   ADL Overall ADL's : Needs assistance/impaired     Grooming: Wash/dry hands;Supervision/safety;Standing Grooming Details (indicate cue type and reason): able to complete 1 grooming task in standing at supervision level. Limited to toleratio as pt c/o lightheadedness. Walked to recliner after 1 grooming task.                 Toilet Transfer: Min guard;Ambulation;Comfort height toilet;Grab bars;Rolling walker (2 wheels) Toilet Transfer Details (indicate cue type and reason): Walked to bathroom pusing IV pole and intermittent HHA on other side. Utilized rw for pericare and up to recliner. Toileting- Water quality scientist and Hygiene: Min guard;Sit to/from stand Toileting - Clothing Manipulation Details (indicate cue type and reason): in standing. grab bar vs rw for external support     Functional mobility during ADLs: Min guard General ADL Comments: Pt walked to bathroom, completed  toilet transfer and pericare. Pt then walked to sink and completed 1 grooming task in standing. Pt c/o lightheadedness. VSS. Had not eaten breakfast. Up to recliner at end of session.    Extremity/Trunk Assessment Upper Extremity Assessment Upper Extremity Assessment: Overall WFL  for tasks assessed   Lower Extremity Assessment Lower Extremity Assessment: Defer to PT evaluation        Vision       Perception     Praxis      Cognition Arousal/Alertness: Awake/alert Behavior During Therapy: WFL for tasks assessed/performed Overall Cognitive Status: Impaired/Different from baseline Area of Impairment: Memory;Problem solving;Attention;Awareness;Safety/judgement                   Current Attention Level: Sustained Memory: Decreased short-term memory   Safety/Judgement: Decreased awareness of safety;Decreased awareness of deficits Awareness: Emergent Problem Solving: Slow processing            Exercises     Shoulder Instructions       General Comments VSS on RA    Pertinent Vitals/ Pain       Pain Assessment: Faces Faces Pain Scale: Hurts little more Pain Location: upper and low-mid back Pain Descriptors / Indicators: Grimacing;Aching Pain Intervention(s): Monitored during session;Repositioned;Limited activity within patient's tolerance  Home Living                                          Prior Functioning/Environment              Frequency  Min 2X/week        Progress Toward Goals  OT Goals(current goals can now be found in the care plan section)  Progress towards OT goals: Progressing toward goals  Acute Rehab OT Goals Patient Stated Goal: Get back home when they let me OT Goal Formulation: With patient Time For Goal Achievement: 09/13/21 Potential to Achieve Goals: Good ADL Goals Pt Will Perform Grooming: with modified independence;standing Pt Will Perform Lower Body Bathing: with modified independence;sit to/from stand Pt Will Perform Lower Body Dressing: with modified independence;sit to/from stand Pt Will Transfer to Toilet: with modified independence;ambulating;regular height toilet Pt Will Perform Toileting - Clothing Manipulation and hygiene: Independently;sit to/from stand  Plan  Discharge plan remains appropriate    Co-evaluation                 AM-PAC OT "6 Clicks" Daily Activity     Outcome Measure   Help from another person eating meals?: None Help from another person taking care of personal grooming?: A Little Help from another person toileting, which includes using toliet, bedpan, or urinal?: A Little Help from another person bathing (including washing, rinsing, drying)?: A Little Help from another person to put on and taking off regular upper body clothing?: None Help from another person to put on and taking off regular lower body clothing?: A Little 6 Click Score: 20    End of Session Equipment Utilized During Treatment: Rolling walker (2 wheels)  OT Visit Diagnosis: Unsteadiness on feet (R26.81);Other symptoms and signs involving cognitive function   Activity Tolerance Other (comment) (pt c/o some lightheadedness towards end of session while standing at sink.)   Patient Left in chair;with call bell/phone within reach;with chair alarm set;Other (comment) (with MD present)   Nurse Communication Other (comment) (NT: pt voided 600, not charted, left in toilet cap)  Time: 9678-9381 OT Time Calculation (min): 14 min  Charges: OT General Charges $OT Visit: 1 Visit OT Treatments $Self Care/Home Management : 8-22 mins  Tyrone Schimke, OT Acute Rehabilitation Services Pager: [phone] Office: [phone]   Hortencia Pilar 09/05/2021, 9:51 AM

## 2021-09-05 NOTE — Care Management Important Message (Signed)
Important Message  Patient Details  Name: Heather [name] MRN: [id number] Date of Birth: [birth year]   Medicare Important Message Given:  Yes     Shelda Altes 09/05/2021, 1:19 PM

## 2021-09-05 NOTE — Progress Notes (Signed)
PROGRESS NOTE    Heather [name]  XKG:[id number] DOB: [birth year] DOA: 08/16/2021 PCP: [name], MD   Chief Complaint  Patient presents with   Blurred Vision  Brief Narrative/Hospital Course:  Heather [name], 70 y.o. female with PMH of CKD stage IIIb, Crohn's disease, hypertension, diabetes mellitus type 2, CVA presented with blurry vision x 2 days w/ stabbing headache, nausea vomiting. She was seen in the ED, underwent- Brain MRI/MRA showed no acute changes.Further work-up with CT chest showed multifocal pneumonia, bilateral pleural effusion, pulmonary edema.  Patient was started on IV antibiotics and also received furosemide but had worsening renal function requiring renal replacement therapy x2. Patient was treated with ceftriaxone and vancomycin for atypical infection, before that she was on cefepime and linezolid.After 9 days of therapy her antibiotics have been discontinued.  She is followed very closely by nephrology.  She continues to do well.   Subjective: Seen this am Was lightheaded while walking back from rest room to chair, has not had BF yet Afebrile overnight blood pressure stable potassium is still high creatinine about the same . UOP not charted accurately. Aaox3 this am, took sometime to name president with prompt.  Assessment & Plan:  AKI on CKD stage IIIb: Baseline creatinine 1.6-2.2 followed by Dr. Theador Hawthorne. AKI in the setting of pneumonia, hypertension urgency chronic diarrhea. Required HD 10/25 due to hyperkalemia, AKI improved but worsened on 10/30 and 11/3. so far serologies negative, second AKI likely due to hemodynamic changes due to fluctuating BPs.  Appreciate nephrology input hypertension meds decreased. ON IV fluids  overnight and rate increased to 75 ml/hr this am. Lokelma x1 again.  Renal functions about the same. On renal diet, k still high.Temporary HD catheter removed 11/6 Recent Labs  Lab 09/01/21 0325 09/02/21 0254 09/03/21 0304  09/04/21 0300 09/05/21 0257  BUN 77* 77* 73* 70* 68*  CREATININE 4.61* 4.51* 4.12* 3.85* 3.79*   Hyperkalemia potassium is still high despite Lokelma and renal diet.  Nephrology managing.  Monitor labs. Recent Labs  Lab 09/01/21 0325 09/02/21 0254 09/03/21 0304 09/04/21 0300 09/05/21 0257  K 4.7 5.3* 5.1 5.3* 5.4*    Acute hypoxic respiratory failure due to multifocal pneumonia  vs CHF: Resolved.  Off oxygen.  Echo showed normal EF 60 to 65% with no regional wall motion abnormalities and LV diastolic parameters were normal.  Multifocal pneumonia: Completed antibiotics.  Currently afebrile. Last wbc counts normal Recent Labs  Lab 08/30/21 0500 09/05/21 0257  WBC 9.2 5.3    Hypertension urgency/emergency: Leading to renal failure.  Currently well controlled on Cardizem 60 every 8 hours and labetalol.  Monitor  Acute metabolic encephalopathy: Multifactorial, resolved, although she does appear to have mild cognitive issues-encourage PT OT ambulation, supportive care.  PT assisted home health.   Anemia of critical illness: S/P transfusion x2 on 1/23- h/h is low. In 7.3.will benefit with 1 unit blood transfusion as she is symptomatic/weak lightheaded. Recent Labs  Lab 08/30/21 0500 09/05/21 0257  HGB 7.8* 7.3*  HCT 24.2* 23.8*    Thrombocytopenia resolved  Blurry vision, bilateral: Brain MRI/MRA no acute changes.  Denies any vision issues at this time.   Crohn's disease on immunosuppressive with Humira at home. Stable  Type 2 diabetes: Blood sugar borderline control increase Premeal insulin to 4 units, continue 17 units.  Bedtime levemir. Her last  A1c stable 6.1 on 10/30 Recent Labs  Lab 09/04/21 0612 09/04/21 1145 09/04/21 1702 09/04/21 2025 09/05/21 0625  GLUCAP 155* 150* 252* 142*  189*   Pressure ulcer of the right buttock stage I POA as below Pressure Injury 08/26/21 Buttocks Right;Left Stage 1 -  Intact skin with non-blanchable redness of a localized area  usually over a bony prominence. (Active)  08/26/21 0050  Location: Buttocks  Location Orientation: Right;Left  Staging: Stage 1 -  Intact skin with non-blanchable redness of a localized area usually over a bony prominence.  Wound Description (Comments):   Present on Admission: Yes    DVT prophylaxis: heparin injection 5,000 Units Start: 08/31/21 1400 Place and maintain sequential compression device Start: 08/23/21 1155 Code Status:   Code Status: DNR Family Communication: plan of care discussed with patient at bedside. Status is: Inpatient Remains inpatient appropriate because: For ongoing management of renal failure pending clearance by nephrology.  Objective: Vitals last 24 hrs: Vitals:   09/04/21 2348 09/05/21 0327 09/05/21 0737 09/05/21 0909  BP: (!) 114/55 (!) 125/42 (!) 135/55 127/67  Pulse: 75 70 74   Resp: 20 20 17    Temp: 97.8 F (36.6 C) 97.9 F (36.6 C) 97.9 F (36.6 C)   TempSrc: Oral Oral Oral   SpO2: 97% 97% 99%   Weight:  71.3 kg    Height:       Weight change: 1.1 kg  Intake/Output Summary (Last 24 hours) at 09/05/2021 0932 Last data filed at 09/04/2021 2125 Gross per 24 hour  Intake 316.87 ml  Output --  Net 316.87 ml   Net IO Since Admission: 916.82 mL [09/05/21 0932]   Physical Examination: General exam: AAOx 3, pleasant, on RA on chair HEENT:Oral mucosa moist, Ear/Nose WNL grossly, dentition normal. Respiratory system: bilaterally clear,no use of accessory muscle Cardiovascular system: S1 & S2 +, No JVD,. Gastrointestinal system: Abdomen soft, NT,ND, BS+ Nervous System:Alert, awake, moving extremities and grossly nonfocal Extremities: No edema, distal peripheral pulses palpable.  Skin: No rashes,no icterus. MSK: Normal muscle bulk,tone, power   Medications reviewed:  Scheduled Meds:  allopurinol  100 mg Oral Daily   aspirin EC  81 mg Oral Daily   atorvastatin  80 mg Oral QHS   Chlorhexidine Gluconate Cloth  6 each Topical Daily    darbepoetin (ARANESP) injection - NON-DIALYSIS  40 mcg Subcutaneous Once   diltiazem  60 mg Oral Q8H   feeding supplement (NEPRO CARB STEADY)  237 mL Oral TID WC   heparin injection (subcutaneous)  5,000 Units Subcutaneous Q8H   insulin aspart  0-5 Units Subcutaneous QHS   insulin aspart  0-9 Units Subcutaneous TID WC   insulin aspart  4 Units Subcutaneous TID WC   insulin detemir  17 Units Subcutaneous QHS   labetalol  200 mg Oral BID   lipase/protease/amylase  36,000-72,000 Units Oral See admin instructions   pantoprazole  40 mg Oral Daily   sodium chloride flush  3 mL Intravenous Q12H   Continuous Infusions:  sodium chloride 250 mL (08/26/21 1235)   sodium chloride 75 mL/hr at 09/05/21 0908      Diet Order             Diet renal/carb modified with fluid restriction Diet-HS Snack? Nothing; Fluid restriction: Other (see comments); Room service appropriate? Yes; Fluid consistency: Thin  Diet effective now                   Nutrition Problem: Inadequate oral intake Etiology: decreased appetite Signs/Symptoms: per patient/family report Interventions: Glucerna shake, MVI  Weight change: 1.1 kg  Wt Readings from Last 3 Encounters:  09/05/21 71.3 kg  08/03/21 67 kg  07/19/21 67.5 kg     Consultants:see note  Procedures:see note Antimicrobials: Anti-infectives (From admission, onward)    Start     Dose/Rate Route Frequency Ordered Stop   08/28/21 1419  vancomycin variable dose per unstable renal function (pharmacist dosing)  Status:  Discontinued         Does not apply See admin instructions 08/28/21 1419 08/30/21 1442   08/26/21 1515  vancomycin (VANCOREADY) IVPB 1250 mg/250 mL        1,250 mg 166.7 mL/hr over 90 Minutes Intravenous  Once 08/26/21 1427 08/26/21 1913   08/26/21 1200  ceFEPIme (MAXIPIME) 2 g in sodium chloride 0.9 % 100 mL IVPB  Status:  Discontinued        2 g 200 mL/hr over 30 Minutes Intravenous Every 24 hours 08/25/21 1710 08/26/21 0811    08/26/21 0900  cefTRIAXone (ROCEPHIN) 2 g in sodium chloride 0.9 % 100 mL IVPB  Status:  Discontinued        2 g 200 mL/hr over 30 Minutes Intravenous Every 24 hours 08/26/21 0811 08/30/21 1442   08/23/21 1000  linezolid (ZYVOX) tablet 600 mg  Status:  Discontinued        600 mg Oral Every 12 hours 08/22/21 1038 08/26/21 0959   08/23/21 0900  ceFEPIme (MAXIPIME) 2 g in sodium chloride 0.9 % 100 mL IVPB        2 g 200 mL/hr over 30 Minutes Intravenous Every 24 hours 08/23/21 0736 08/25/21 1900   08/19/21 0745  ceFEPIme (MAXIPIME) 2 g in sodium chloride 0.9 % 100 mL IVPB  Status:  Discontinued        2 g 200 mL/hr over 30 Minutes Intravenous Every 24 hours 08/19/21 0651 08/23/21 0736   08/19/21 0745  vancomycin (VANCOREADY) IVPB 750 mg/150 mL  Status:  Discontinued        750 mg 150 mL/hr over 60 Minutes Intravenous Every 48 hours 08/19/21 0651 08/22/21 1038   08/18/21 0945  fluconazole (DIFLUCAN) tablet 150 mg        150 mg Oral  Once 08/18/21 0853 08/18/21 0951      Culture/Microbiology    Component Value Date/Time   SDES  08/19/2021 0827    BLOOD LEFT HAND BOTTLES DRAWN AEROBIC AND ANAEROBIC   SPECREQUEST Blood Culture adequate volume 08/19/2021 0827   CULT  08/19/2021 0827    NO GROWTH 5 DAYS Performed at [facility], [address] [place], [place] [address]    REPTSTATUS 08/24/2021 FINAL 08/19/2021 0827    Other culture-see note  Unresulted Labs (From admission, onward)     Start     Ordered   08/22/21 0500  Renal function panel  Daily,   R     Question:  Specimen collection method  Answer:  Lab=Lab collect   08/21/21 0904          Data Reviewed: I have personally reviewed following labs and imaging studies CBC: Recent Labs  Lab 08/30/21 0500 09/05/21 0257  WBC 9.2 5.3  HGB 7.8* 7.3*  HCT 24.2* 23.8*  MCV 94.2 97.9  PLT 199 563   Basic Metabolic Panel: Recent Labs  Lab 09/01/21 0325 09/02/21 0254 09/03/21 0304 09/04/21 0300 09/05/21 0257  NA 131*  131* 135 134* 134*  K 4.7 5.3* 5.1 5.3* 5.4*  CL 100 100 106 105 103  CO2 22 22 19* 19* 20*  GLUCOSE 157* 216* 127* 194* 183*  BUN 77* 77* 73* 70* 68*  CREATININE 4.61* 4.51* 4.12* 3.85* 3.79*  CALCIUM 8.7* 9.0 8.9 8.8* 8.9  PHOS 6.3* 6.2* 6.0* 5.1* 5.3*   GFR: Estimated Creatinine Clearance: 13.6 mL/min (A) (by C-G formula based on SCr of 3.79 mg/dL (H)). Liver Function Tests: Recent Labs  Lab 09/01/21 0325 09/02/21 0254 09/03/21 0304 09/04/21 0300 09/05/21 0257  ALBUMIN 2.4* 2.5* 2.6* 2.4* 2.7*   No results for input(s): LIPASE, AMYLASE in the last 168 hours. No results for input(s): AMMONIA in the last 168 hours. Coagulation Profile: No results for input(s): INR, PROTIME in the last 168 hours. Cardiac Enzymes: No results for input(s): CKTOTAL, CKMB, CKMBINDEX, TROPONINI in the last 168 hours. BNP (last 3 results) No results for input(s): PROBNP in the last 8760 hours. HbA1C: No results for input(s): HGBA1C in the last 72 hours. CBG: Recent Labs  Lab 09/04/21 0612 09/04/21 1145 09/04/21 1702 09/04/21 2025 09/05/21 0625  GLUCAP 155* 150* 252* 142* 189*   Lipid Profile: No results for input(s): CHOL, HDL, LDLCALC, TRIG, CHOLHDL, LDLDIRECT in the last 72 hours. Thyroid Function Tests: No results for input(s): TSH, T4TOTAL, FREET4, T3FREE, THYROIDAB in the last 72 hours. Anemia Panel: Recent Labs    09/05/21 0757  FERRITIN 517*  TIBC 223*  IRON 69   Sepsis Labs: No results for input(s): PROCALCITON, LATICACIDVEN in the last 168 hours.  No results found for this or any previous visit (from the past 240 hour(s)).   Radiology Studies: No results found.   LOS: 19 days   Antonieta Pert, MD [facility]  09/05/2021, 9:32 AM

## 2021-09-05 NOTE — Progress Notes (Signed)
Mobility Specialist: Progress Note   09/05/21 1819  Mobility  Activity Refused mobility   Attempted to ambulate pt earlier today but pt had just received lunch and was receiving blood transfusion. Came back and pt refused mobility d/t c/o back pain, no rating given. Will f/u tomorrow.  [facility] Chantavia Bazzle Mobility Specialist Mobility Specialist Phone: [phone]

## 2021-09-05 NOTE — Progress Notes (Signed)
[facility] ROUNDING NOTE   Subjective:   No urine output is charted over 11/7.  Strict ins/outs not available.  She states she had urine output and had a BM.  Spoke with RN and floor team is going to work on capturing ins/outs today.  We discussed risks/benefits/indications for ESA and she agrees to receive.  Review of systems: Denies shortness of breath or chest pain Denies n/v Urinating without difficulty  Objective:  Vital signs in last 24 hours:  Temp:  [97.8 F (36.6 C)-98.4 F (36.9 C)] 97.9 F (36.6 C) (11/08 0327) Pulse Rate:  [70-80] 70 (11/08 0327) Resp:  [16-20] 20 (11/08 0327) BP: (114-156)/(42-92) 125/42 (11/08 0327) SpO2:  [94 %-99 %] 97 % (11/08 0327) Weight:  [71.3 kg] 71.3 kg (11/08 0327)  Weight change: 1.1 kg Filed Weights   09/01/21 0343 09/04/21 0610 09/05/21 0327  Weight: 70.2 kg 70.2 kg 71.3 kg    Physical exam   General adult female in bed in no acute distress HEENT normocephalic atraumatic extraocular movements intact sclera anicteric Neck supple trachea midline Lungs clear to auscultation bilaterally normal work of breathing at rest on room air Heart S1S2 no rub Abdomen soft nontender nondistended Extremities no edema  Psych normal mood and affect Neuro alert and oriented x 3 provides hx and follows commands Access dialysis catheter is out  Basic Metabolic Panel: Recent Labs  Lab 09/01/21 0325 09/02/21 0254 09/03/21 0304 09/04/21 0300 09/05/21 0257  NA 131* 131* 135 134* 134*  K 4.7 5.3* 5.1 5.3* 5.4*  CL 100 100 106 105 103  CO2 22 22 19* 19* 20*  GLUCOSE 157* 216* 127* 194* 183*  BUN 77* 77* 73* 70* 68*  CREATININE 4.61* 4.51* 4.12* 3.85* 3.79*  CALCIUM 8.7* 9.0 8.9 8.8* 8.9  PHOS 6.3* 6.2* 6.0* 5.1* 5.3*    Liver Function Tests: Recent Labs  Lab 09/01/21 0325 09/02/21 0254 09/03/21 0304 09/04/21 0300 09/05/21 0257  ALBUMIN 2.4* 2.5* 2.6* 2.4* 2.7*     CBC: Recent Labs  Lab 08/30/21 0500  09/05/21 0257  WBC 9.2 5.3  HGB 7.8* 7.3*  HCT 24.2* 23.8*  MCV 94.2 97.9  PLT 199 246    CBG: Recent Labs  Lab 09/04/21 0612 09/04/21 1145 09/04/21 1702 09/04/21 2025 09/05/21 0625  GLUCAP 155* 150* 252* 142* 189*    Microbiology: Results for orders placed or performed during the hospital encounter of 08/16/21  Resp Panel by RT-PCR (Flu A&B, Covid) Nasopharyngeal Swab     Status: None   Collection Time: 08/16/21  8:28 PM   Specimen: Nasopharyngeal Swab; Nasopharyngeal(NP) swabs in vial transport medium  Result Value Ref Range Status   SARS Coronavirus 2 by RT PCR NEGATIVE NEGATIVE Final    Comment: (NOTE) SARS-CoV-2 target nucleic acids are NOT DETECTED.  The SARS-CoV-2 RNA is generally detectable in upper respiratory specimens during the acute phase of infection. The lowest concentration of SARS-CoV-2 viral copies this assay can detect is 138 copies/mL. A negative result does not preclude SARS-Cov-2 infection and should not be used as the sole basis for treatment or other patient management decisions. A negative result may occur with  improper specimen collection/handling, submission of specimen other than nasopharyngeal swab, presence of viral mutation(s) within the areas targeted by this assay, and inadequate number of viral copies(<138 copies/mL). A negative result must be combined with clinical observations, patient history, and epidemiological information. The expected result is Negative.  Fact Sheet for Patients:  EntrepreneurPulse.com.au  Fact Sheet for  Healthcare Providers:  IncredibleEmployment.be  This test is no t yet approved or cleared by the [place] and  has been authorized for detection and/or diagnosis of SARS-CoV-2 by FDA under an Emergency Use Authorization (EUA). This EUA will remain  in effect (meaning this test can be used) for the duration of the COVID-19 declaration under Section 564(b)(1) of  the Act, 21 U.S.C.section 360bbb-3(b)(1), unless the authorization is terminated  or revoked sooner.       Influenza A by PCR NEGATIVE NEGATIVE Final   Influenza B by PCR NEGATIVE NEGATIVE Final    Comment: (NOTE) The Xpert Xpress SARS-CoV-2/FLU/RSV plus assay is intended as an aid in the diagnosis of influenza from Nasopharyngeal swab specimens and should not be used as a sole basis for treatment. Nasal washings and aspirates are unacceptable for Xpert Xpress SARS-CoV-2/FLU/RSV testing.  Fact Sheet for Patients: EntrepreneurPulse.com.au  Fact Sheet for Healthcare Providers: IncredibleEmployment.be  This test is not yet approved or cleared by the [place] FDA and has been authorized for detection and/or diagnosis of SARS-CoV-2 by FDA under an Emergency Use Authorization (EUA). This EUA will remain in effect (meaning this test can be used) for the duration of the COVID-19 declaration under Section 564(b)(1) of the Act, 21 U.S.C. section 360bbb-3(b)(1), unless the authorization is terminated or revoked.  Performed at [facility], [address], [place], [place] [address]   Urine Culture     Status: None   Collection Time: 08/17/21 11:00 AM   Specimen: Urine, Clean Catch  Result Value Ref Range Status   Specimen Description   Final    URINE, CLEAN CATCH Performed at [facility], [address] [place], [place] [address]    Special Requests   Final    NONE Performed at [facility], [address] [place], [place] [address]    Culture   Final    NO GROWTH Performed at [facility], [place] [address], [place], [place] [address]    Report Status 08/19/2021 FINAL  Final  Culture, blood (Routine X 2) w Reflex to ID Panel     Status: None   Collection Time: 08/19/21  7:28 AM   Specimen: BLOOD LEFT ARM  Result Value Ref Range Status   Specimen Description BLOOD LEFT ARM BOTTLES DRAWN AEROBIC AND ANAEROBIC  Final   Special  Requests   Final    Blood Culture results may not be optimal due to an excessive volume of blood received in culture bottles   Culture   Final    NO GROWTH 5 DAYS Performed at [facility], [address], [place], [place] [address]    Report Status 08/24/2021 FINAL  Final  Culture, blood (Routine X 2) w Reflex to ID Panel     Status: None   Collection Time: 08/19/21  8:27 AM   Specimen: BLOOD LEFT HAND  Result Value Ref Range Status   Specimen Description   Final    BLOOD LEFT HAND BOTTLES DRAWN AEROBIC AND ANAEROBIC   Special Requests Blood Culture adequate volume  Final   Culture   Final    NO GROWTH 5 DAYS Performed at [facility], [address] [place], [place] [address]    Report Status 08/24/2021 FINAL  Final  MRSA Next Gen by PCR, Nasal     Status: Abnormal   Collection Time: 08/21/21  5:40 PM   Specimen: Nasal Mucosa; Nasal Swab  Result Value Ref Range Status   MRSA by PCR Next  Gen DETECTED (A) NOT DETECTED Final    Comment: RESULT CALLED TO, READ BACK BY AND VERIFIED WITH: JESSICA HEARN RN,2021,08/21/2021, SELF S. (NOTE) The GeneXpert MRSA Assay (FDA approved for NASAL specimens only), is one component of a comprehensive MRSA colonization surveillance program. It is not intended to diagnose MRSA infection nor to guide or monitor treatment for MRSA infections. Test performance is not FDA approved in patients less than 14 years old. Performed at [facility], [address] [place], [place] [address]   MRSA Next Gen by PCR, Nasal     Status: Abnormal   Collection Time: 08/26/21 12:49 AM   Specimen: Nasal Mucosa; Nasal Swab  Result Value Ref Range Status   MRSA by PCR Next Gen DETECTED (A) NOT DETECTED Final    Comment: RESULT CALLED TO, READ BACK BY AND VERIFIED WITH: RN DAVID S.  [id number] FCP (NOTE) The GeneXpert MRSA Assay (FDA approved for NASAL specimens only), is one component of a comprehensive MRSA colonization surveillance program. It is not intended to  diagnose MRSA infection nor to guide or monitor treatment for MRSA infections. Test performance is not FDA approved in patients less than 4 years old. Performed at [facility], [place] [address], [place], [place] [address]   Respiratory (~20 pathogens) panel by PCR     Status: None   Collection Time: 08/26/21  9:31 AM   Specimen: Nasopharyngeal Swab; Respiratory  Result Value Ref Range Status   Adenovirus NOT DETECTED NOT DETECTED Final   Coronavirus 229E NOT DETECTED NOT DETECTED Final    Comment: (NOTE) The Coronavirus on the Respiratory Panel, DOES NOT test for the novel  Coronavirus (2019 nCoV)    Coronavirus HKU1 NOT DETECTED NOT DETECTED Final   Coronavirus NL63 NOT DETECTED NOT DETECTED Final   Coronavirus OC43 NOT DETECTED NOT DETECTED Final   Metapneumovirus NOT DETECTED NOT DETECTED Final   Rhinovirus / Enterovirus NOT DETECTED NOT DETECTED Final   Influenza A NOT DETECTED NOT DETECTED Final   Influenza B NOT DETECTED NOT DETECTED Final   Parainfluenza Virus 1 NOT DETECTED NOT DETECTED Final   Parainfluenza Virus 2 NOT DETECTED NOT DETECTED Final   Parainfluenza Virus 3 NOT DETECTED NOT DETECTED Final   Parainfluenza Virus 4 NOT DETECTED NOT DETECTED Final   Respiratory Syncytial Virus NOT DETECTED NOT DETECTED Final   Bordetella pertussis NOT DETECTED NOT DETECTED Final   Bordetella Parapertussis NOT DETECTED NOT DETECTED Final   Chlamydophila pneumoniae NOT DETECTED NOT DETECTED Final   Mycoplasma pneumoniae NOT DETECTED NOT DETECTED Final    Comment: Performed at [facility], [place]. [address] [place], [place] [address]    Imaging: No results found.   Medications:    sodium chloride 250 mL (08/26/21 1235)    allopurinol  100 mg Oral Daily   aspirin EC  81 mg Oral Daily   atorvastatin  80 mg Oral QHS   Chlorhexidine Gluconate Cloth  6 each Topical Daily   diltiazem  60 mg Oral Q8H   feeding supplement (GLUCERNA SHAKE)  237 mL Oral TID BM    heparin injection (subcutaneous)  5,000 Units Subcutaneous Q8H   insulin aspart  0-5 Units Subcutaneous QHS   insulin aspart  0-9 Units Subcutaneous TID WC   insulin aspart  2 Units Subcutaneous TID WC   insulin detemir  17 Units Subcutaneous QHS   labetalol  200 mg Oral BID   lipase/protease/amylase  36,000-72,000 Units Oral See admin instructions  multivitamin with minerals  1 tablet Oral Daily   pantoprazole  40 mg Oral Daily   sodium chloride flush  3 mL Intravenous Q12H   sodium chloride, acetaminophen, cyclobenzaprine, diphenhydrAMINE, diphenoxylate-atropine, haloperidol lactate, HYDROmorphone, ipratropium-albuterol, LORazepam, meclizine, ondansetron **OR** ondansetron (ZOFRAN) IV, sodium chloride flush, temazepam  Assessment/ Plan:  AKI/CKD stage IIIb - in setting of PNA and hypertensive urgency and chronic diarrhea.  Baseline Scr variable 1.6-2.2 and follows Dr. Theador Hawthorne as an outpatient. Required HD 10/25 due to hyperkalemia.  Initial AKI improved and worsened between 10/30 and 11/3. Now improving again.  Serologies negative.  Her second AKI was likely related to hemodynamic changes due to fluctuating Bps. Now slowly improving after decreasing HTN meds Urine output not documented.  I have re-ordered strict ins/outs  Temporary HD catheter is out NS at 75/hr x 12 hours  Lokelma once again today  Changed to renal DM diet and changed glucerna to nepro Acute hypoxic respiratory failure - due to multifocal pneumonia vs CHF.  S/p antibiotics. V/Q negative. Much improved. Careful with hydration HTN urgency/emergency -much improved at this time.  Hydralazine was stopped due to intermittently low blood pressures.   AMS  much improved; multifactorial Anemia of critical illness - s/p transfusion.  Check iron stores and give aranesp 40 mcg once on 11/8 Thrombocytopenia -now normalized.  Possibly was related to hypertension   Claudia Desanctis, MD 09/05/2021 7:33 AM

## 2021-09-05 NOTE — Progress Notes (Signed)
Physical Therapy Treatment Patient Details Name: Heather [name] MRN: [id number] DOB: 1950-11-23 Today's Date: 09/05/2021   History of Present Illness Pt is a 70 y.o. female admitted 08/16/21 wit hblurred vision, HTN, headache, AMS. MRI 10/19 negative for acute stroke; MRA showed L ACA stenosis. Workup for AKI on CKD s/p RIJ temporary cath placement for HD initiation 10/23. Chest CT showed multifocal PNA, bilateral pleural effusion, pulmonary edema. MRI 10/30 showed small/punctate infarcts in L cerebellum, L frontal operculum. PMH includes HTN, CKD, DM, stroke, BPPV, Crohn's disease.   PT Comments    Pt progressing with mobility. Today's session focused on gait training without DME while incorporating various balance and cognitive tasks. Pt remains limited by generalized weakness, decreased activity tolerance, poor balance strategies/postural reactions and impaired cognition. Recommend RW use upon return home, as well as increased assist/supervision from family; pt reports understanding. Will continue to follow acutely to address established goals.     Recommendations for follow up therapy are one component of a multi-disciplinary discharge planning process, led by the attending physician.  Recommendations may be updated based on patient status, additional functional criteria and insurance authorization.  Follow Up Recommendations  Home health PT     Assistance Recommended at Discharge Frequent or constant Supervision/Assistance  Equipment Recommendations  None recommended by PT    Recommendations for Other Services       Precautions / Restrictions Precautions Precautions: Fall Precaution Comments: monitor BP Restrictions Weight Bearing Restrictions: No     Mobility  Bed Mobility               General bed mobility comments: Received sitting in recliner    Transfers Overall transfer level: Needs assistance Equipment used: None Transfers: Sit to/from Stand Sit to  Stand: Min guard           General transfer comment: Initial stand without DME, min guard for balance and pt reaching to furniture for added stability    Ambulation/Gait Ambulation/Gait assistance: Min guard;Min assist Gait Distance (Feet): 160 Feet Assistive device: None;IV Pole;Rolling walker (2 wheels) Gait Pattern/deviations: Step-through pattern;Decreased stride length;Narrow base of support;Staggering right       General Gait Details: Pt initially declined ambulation without RW - slow, mildly unsteady gait without DME, pt requiring intermittent minA to prevent LOB, pt frequently reaching to rail/IV pole/furniture for UE support despite repeated cues to attempt without; when asked about using walker, pt reports she'll use it at home although declining it earler - stability much improved with UE support   Stairs             Wheelchair Mobility    Modified Rankin (Stroke Patients Only) Modified Rankin (Stroke Patients Only) Pre-Morbid Rankin Score: No symptoms Modified Rankin: Moderately severe disability     Balance Overall balance assessment: Needs assistance Sitting-balance support: Feet supported;Single extremity supported;No upper extremity supported Sitting balance-Leahy Scale: Good     Standing balance support: No upper extremity supported;Single extremity supported;Bilateral upper extremity supported;During functional activity Standing balance-Leahy Scale: Fair Standing balance comment: can static stand without UE support, static and dynamic stability improved with UE support               High Level Balance Comments: Postive LOB ambulating with changes in gait speed, head turns and direction changes            Cognition Arousal/Alertness: Awake/alert Behavior During Therapy: WFL for tasks assessed/performed Overall Cognitive Status: No family/caregiver present to determine baseline cognitive functioning Area of Impairment:  Attention;Memory;Following commands;Safety/judgement;Awareness;Problem solving                   Current Attention Level: Sustained Memory: Decreased short-term memory Following Commands: Follows one step commands consistently;Follows multi-step commands inconsistently Safety/Judgement: Decreased awareness of safety;Decreased awareness of deficits Awareness: Emergent Problem Solving: Slow processing General Comments: Following simple commands well; noted some difficulty word finding, unable to recall 0/3 words after ~5-min conversation, difficulty multitasking in hallway environment. Pt initially stating she has a granddaughter, later stating she doesn't; states husband will provide 24/7, later stating he works outside of home        Exercises      General Comments General comments (skin integrity, edema, etc.): increased time discussing recommendations for increased assist/supervision from family upon return home, especially with ADL tasks (bathing) and iADLs (managing medications)      Pertinent Vitals/Pain Pain Assessment: Faces Faces Pain Scale: Hurts little more Pain Location: upper and low-mid back Pain Descriptors / Indicators: Grimacing;Aching;Discomfort Pain Intervention(s): Monitored during session    Home Living                          Prior Function            PT Goals (current goals can now be found in the care plan section) Progress towards PT goals: Progressing toward goals    Frequency    Min 4X/week      PT Plan Current plan remains appropriate    Co-evaluation              AM-PAC PT "6 Clicks" Mobility   Outcome Measure  Help needed turning from your back to your side while in a flat bed without using bedrails?: A Little Help needed moving from lying on your back to sitting on the side of a flat bed without using bedrails?: A Little Help needed moving to and from a bed to a chair (including a wheelchair)?: A Little Help  needed standing up from a chair using your arms (e.g., wheelchair or bedside chair)?: A Little Help needed to walk in hospital room?: A Little Help needed climbing 3-5 steps with a railing? : A Little 6 Click Score: 18    End of Session Equipment Utilized During Treatment: Gait belt Activity Tolerance: Patient tolerated treatment well Patient left: in chair;with call bell/phone within reach;with chair alarm set Nurse Communication: Mobility status PT Visit Diagnosis: Unsteadiness on feet (R26.81);Other abnormalities of gait and mobility (R26.89);Muscle weakness (generalized) (M62.81);History of falling (Z91.81);Difficulty in walking, not elsewhere classified (R26.2);Dizziness and giddiness (R42)     Time: 7867-5449 PT Time Calculation (min) (ACUTE ONLY): 27 min  Charges:  $Gait Training: 8-22 mins $Neuromuscular Re-education: 8-22 mins                     Mabeline Caras, PT, DPT Acute Rehabilitation Services  Pager [phone] Office [place] 09/05/2021, 1:19 PM

## 2021-09-06 ENCOUNTER — Other Ambulatory Visit (HOSPITAL_COMMUNITY): Payer: Self-pay

## 2021-09-06 LAB — RENAL FUNCTION PANEL
Albumin: 2.5 g/dL — ABNORMAL LOW (ref 3.5–5.0)
Anion gap: 11 (ref 5–15)
BUN: 66 mg/dL — ABNORMAL HIGH (ref 8–23)
CO2: 18 mmol/L — ABNORMAL LOW (ref 22–32)
Calcium: 8.7 mg/dL — ABNORMAL LOW (ref 8.9–10.3)
Chloride: 105 mmol/L (ref 98–111)
Creatinine, Ser: 3.6 mg/dL — ABNORMAL HIGH (ref 0.44–1.00)
GFR, Estimated: 13 mL/min — ABNORMAL LOW (ref >60)
Glucose, Bld: 162 mg/dL — ABNORMAL HIGH (ref 70–99)
Phosphorus: 4.9 mg/dL — ABNORMAL HIGH (ref 2.5–4.6)
Potassium: 5.4 mmol/L — ABNORMAL HIGH (ref 3.5–5.1)
Sodium: 134 mmol/L — ABNORMAL LOW (ref 135–145)

## 2021-09-06 LAB — CBC
HCT: 26.7 % — ABNORMAL LOW (ref 36.0–46.0)
Hemoglobin: 8.2 g/dL — ABNORMAL LOW (ref 12.0–15.0)
MCH: 29.8 pg (ref 26.0–34.0)
MCHC: 30.7 g/dL (ref 30.0–36.0)
MCV: 97.1 fL (ref 80.0–100.0)
Platelets: 222 10*3/uL (ref 150–400)
RBC: 2.75 MIL/uL — ABNORMAL LOW (ref 3.87–5.11)
RDW: 15.9 % — ABNORMAL HIGH (ref 11.5–15.5)
WBC: 5.2 10*3/uL (ref 4.0–10.5)
nRBC: 0 % (ref 0.0–0.2)

## 2021-09-06 LAB — TYPE AND SCREEN
ABO/RH(D): O POS
Antibody Screen: NEGATIVE
Unit division: 0

## 2021-09-06 LAB — BPAM RBC
Blood Product Expiration Date: 202212032359
ISSUE DATE / TIME: 202211081338
Unit Type and Rh: 5100

## 2021-09-06 LAB — GLUCOSE, CAPILLARY
Glucose-Capillary: 127 mg/dL — ABNORMAL HIGH (ref 70–99)
Glucose-Capillary: 137 mg/dL — ABNORMAL HIGH (ref 70–99)

## 2021-09-06 MED ORDER — NEPRO/CARBSTEADY PO LIQD
237.0000 mL | Freq: Three times a day (TID) | ORAL | 0 refills | Status: AC
Start: 2021-09-06 — End: 2021-09-20

## 2021-09-06 MED ORDER — LEVEMIR FLEXTOUCH 100 UNIT/ML ~~LOC~~ SOPN: 17.0000 [IU] | PEN_INJECTOR | Freq: Every day | SUBCUTANEOUS | 0 refills | Status: DC

## 2021-09-06 MED ORDER — SODIUM POLYSTYRENE SULFONATE 15 GM/60ML PO SUSP
ORAL | 0 refills | Status: DC
  Filled 2021-09-06: qty 120, 1d supply, fill #0

## 2021-09-06 MED ORDER — INSULIN LISPRO (1 UNIT DIAL) 100 UNIT/ML (KWIKPEN): 4.0000 [IU] | PEN_INJECTOR | Freq: Three times a day (TID) | SUBCUTANEOUS | Status: DC

## 2021-09-06 MED ORDER — SODIUM POLYSTYRENE SULFONATE 15 GM/60ML PO SUSP
30.0000 g | Freq: Once | ORAL | Status: AC
  Administered 2021-09-06: 30 g via ORAL
  Filled 2021-09-06 (×2): qty 120

## 2021-09-06 MED ORDER — DILTIAZEM HCL ER COATED BEADS 180 MG PO CP24
180.0000 mg | ORAL_CAPSULE | Freq: Every day | ORAL | 0 refills | Status: DC
  Filled 2021-09-06: qty 30, 30d supply, fill #0

## 2021-09-06 MED ORDER — SODIUM CHLORIDE 0.9 % IV SOLN: INTRAVENOUS | Status: DC

## 2021-09-06 MED ORDER — ASPIRIN 81 MG PO TBEC
81.0000 mg | DELAYED_RELEASE_TABLET | Freq: Every day | ORAL | 0 refills | Status: AC
Start: 2021-09-07 — End: 2021-10-07
  Filled 2021-09-06: qty 30, 30d supply, fill #0

## 2021-09-06 MED ORDER — LABETALOL HCL 200 MG PO TABS
200.0000 mg | ORAL_TABLET | Freq: Two times a day (BID) | ORAL | 0 refills | Status: DC
  Filled 2021-09-06: qty 60, 30d supply, fill #0

## 2021-09-06 NOTE — Progress Notes (Signed)
Mobility Specialist: Progress Note   09/06/21 1536  Mobility  Activity Ambulated in hall  Level of Assistance Standby assist, set-up cues, supervision of patient - no hands on  Assistive Device  (IV Pole)  Distance Ambulated (ft) 100 ft  Mobility Ambulated with assistance in hallway  Mobility Response Tolerated well  Mobility performed by Mobility specialist  $Mobility charge 1 Mobility   Pt assisted back to the room per RN request. Pt is sitting EOB with call bell at her side. Bed alarm is on.  [facility] Maisley Hainsworth Mobility Specialist Mobility Specialist Phone: [phone]

## 2021-09-06 NOTE — Progress Notes (Signed)
Mobility Specialist: Progress Note   09/06/21 1448  Mobility  Activity Ambulated in hall  Level of Assistance Modified independent, requires aide device or extra time  Assistive Device  (IV Pole)  Distance Ambulated (ft) 100 ft  Mobility Ambulated with assistance in hallway  Mobility Response Tolerated well  Mobility performed by Mobility specialist  $Mobility charge 1 Mobility   Pt coming out of BR upon entering room. Pt agreeable to ambulation, no c/o throughout. Pt sitting EOB after walk with Dietician in the room.   [facility] Heather [name] Mobility Specialist Mobility Specialist Phone: [phone]

## 2021-09-06 NOTE — Progress Notes (Addendum)
Physical Therapy Treatment Patient Details Name: Heather [name] MRN: [id number] DOB: 09/11/1951 Today's Date: 09/06/2021   History of Present Illness Pt is a 70 y.o. female admitted 08/16/21 with blurred vision, HTN, headache, AMS. MRI 10/19 negative for acute stroke; MRA showed L ACA stenosis. Workup for AKI on CKD s/p RIJ temporary cath placement for HD initiation 10/23. Chest CT showed multifocal PNA, bilateral pleural effusion, pulmonary edema. MRI 10/30 showed small/punctate infarcts in L cerebellum, L frontal operculum. PMH includes HTN, CKD, DM, stroke, BPPV, Crohn's disease.   PT Comments    Pt preparing for d/c home today. Pt declining OOB mobility, therefore, today's session focused on cognition and strategies/education for safe return home. Pt has been mobilizing well, but remains limited by cognitive impairment, including decreased attention, poor awareness and difficulty problem solving; difficult to determine extent of true cognitive impairment versus desire to participate. Pt reports that her cognition is baseline. Continue to recommend HHPT services and increased support from family upon return home.    Recommendations for follow up therapy are one component of a multi-disciplinary discharge planning process, led by the attending physician.  Recommendations may be updated based on patient status, additional functional criteria and insurance authorization.  Follow Up Recommendations  Home health PT     Assistance Recommended at Discharge Frequent or constant Supervision/Assistance  Equipment Recommendations  None recommended by PT    Recommendations for Other Services       Precautions / Restrictions Precautions Precautions: Fall Restrictions Weight Bearing Restrictions: No     Mobility  Bed Mobility Overal bed mobility: Independent             General bed mobility comments: Pt mod indep with multiple bouts of repositioning and sitting up in bed; at times  performing log roll then powering up into long sitting from bed flat; independent without rail support; able to bridge hips up for heating pad placement    Transfers                   General transfer comment:  (pt adamantly declining OOB activity)    Ambulation/Gait                   Stairs             Wheelchair Mobility    Modified Rankin (Stroke Patients Only) Modified Rankin (Stroke Patients Only) Pre-Morbid Rankin Score: No symptoms Modified Rankin: Moderately severe disability     Balance Overall balance assessment: Needs assistance Sitting-balance support: Feet supported;Single extremity supported;No upper extremity supported Sitting balance-Leahy Scale: Good                                      Cognition Arousal/Alertness: Awake/alert Behavior During Therapy: Flat affect Overall Cognitive Status: No family/caregiver present to determine baseline cognitive functioning Area of Impairment: Attention;Memory;Following commands;Safety/judgement;Awareness;Problem solving;Orientation                               General Comments: pt seems more disgruntled and confused this session; difficult to determine extent of true cognitive impairment versus desire to participate; noted poor attention, but also difficult to determine if pt just not interested in answering questions; pt stating Tuesday Novermber "the year two thousand two hundred", 4th floor of  hospital, state is [place], also states the country is "[place]".  Pt requiring repeated cues to answer questions and follow commands. In middle of attempt at cognition questions (taken from MMSE), pt randomly states, "Is that two people doing it in the bathroom?" ... "I haven't heard from [place], like I told you" (unrelated to current conversation topics). Pt reports having already recently eaten lunch, a ham sandwich; lunch tray delivered later in session. Pt reports her  cognition is at baseline        Exercises      General Comments General comments (skin integrity, edema, etc.): Pt adamantly declining OOB activity secondary to back pain and fatigue, therefore focused session on cognitive-related task and safety strategies for return home. Again, difficult to determine true cognitive impairment (including decreased awareness) versus pt's desire to honestly answer questions or desire to participate      Pertinent Vitals/Pain Pain Assessment: Faces Faces Pain Scale: Hurts little more Pain Location: mid-lower back Pain Descriptors / Indicators: Grimacing;Guarding Pain Intervention(s): Monitored during session;Heat applied    Home Living                          Prior Function            PT Goals (current goals can now be found in the care plan section) Progress towards PT goals: Not progressing toward goals - comment    Frequency    Min 4X/week      PT Plan Current plan remains appropriate    Co-evaluation              AM-PAC PT "6 Clicks" Mobility   Outcome Measure  Help needed turning from your back to your side while in a flat bed without using bedrails?: None Help needed moving from lying on your back to sitting on the side of a flat bed without using bedrails?: None Help needed moving to and from a bed to a chair (including a wheelchair)?: A Little Help needed standing up from a chair using your arms (e.g., wheelchair or bedside chair)?: A Little Help needed to walk in hospital room?: A Little Help needed climbing 3-5 steps with a railing? : A Little 6 Click Score: 20    End of Session   Activity Tolerance: Patient limited by fatigue Patient left: in bed;with call bell/phone within reach;with bed alarm set Nurse Communication: Mobility status PT Visit Diagnosis: Unsteadiness on feet (R26.81);Other abnormalities of gait and mobility (R26.89);Muscle weakness (generalized) (M62.81);History of falling  (Z91.81);Difficulty in walking, not elsewhere classified (R26.2);Dizziness and giddiness (R42)     Time: 3154-0086 PT Time Calculation (min) (ACUTE ONLY): 26 min  Charges:  $Self Care/Home Management: [place], PT, DPT Acute Rehabilitation Services  Pager [phone] Office [place] 09/06/2021, 4:30 PM

## 2021-09-06 NOTE — Progress Notes (Signed)
Patient given discharge instructions. PIV removed. Patient taken to vehicle by wheelchair by staff.  Daymon Larsen, RN

## 2021-09-06 NOTE — Progress Notes (Signed)
[facility] ROUNDING NOTE   Subjective:   She had 800 mL urine output charted over 11/8.  When I arrived she had her jeans on and was trying to clean up a large amount of urine on the floor from when she didn't make it to the restroom.  I called staff to assist and they came and we asked the patient to sit on her bed or chair and thanked her for her efforts but asked her to call if she ever had a spill or accident again so that she wouldn't fall.  She states she has an appt with Dr. Theador Hawthorne next week.   Review of systems:  Denies shortness of breath or chest pain Denies n/v Urinating without difficulty  Objective:  Vital signs in last 24 hours:  Temp:  [97.9 F (36.6 C)-98.6 F (37 C)] 98.2 F (36.8 C) (11/09 0350) Pulse Rate:  [68-77] 77 (11/09 0350) Resp:  [17-19] 17 (11/09 0350) BP: (102-149)/(42-88) 147/54 (11/09 0350) SpO2:  [94 %-100 %] 97 % (11/09 0350) Weight:  [72 kg] 72 kg (11/09 0350)  Weight change: 0.686 kg Filed Weights   09/04/21 0610 09/05/21 0327 09/06/21 0350  Weight: 70.2 kg 71.3 kg 72 kg    Physical exam   General adult female in bed and ambulatory on arrival in no acute distress HEENT normocephalic atraumatic extraocular movements intact sclera anicteric Neck supple trachea midline Lungs clear to auscultation bilaterally normal work of breathing at rest on room air. ambulatory Heart S1S2 no rub Abdomen soft nontender nondistended Extremities no edema  Psych normal mood and affect Neuro alert and oriented x 3 provides hx and follows commands Access dialysis catheter is out  Basic Metabolic Panel: Recent Labs  Lab 09/02/21 0254 09/03/21 0304 09/04/21 0300 09/05/21 0257 09/06/21 0153  NA 131* 135 134* 134* 134*  K 5.3* 5.1 5.3* 5.4* 5.4*  CL 100 106 105 103 105  CO2 22 19* 19* 20* 18*  GLUCOSE 216* 127* 194* 183* 162*  BUN 77* 73* 70* 68* 66*  CREATININE 4.51* 4.12* 3.85* 3.79* 3.60*  CALCIUM 9.0 8.9 8.8* 8.9 8.7*  PHOS 6.2*  6.0* 5.1* 5.3* 4.9*    Liver Function Tests: Recent Labs  Lab 09/02/21 0254 09/03/21 0304 09/04/21 0300 09/05/21 0257 09/06/21 0153  ALBUMIN 2.5* 2.6* 2.4* 2.7* 2.5*     CBC: Recent Labs  Lab 09/05/21 0257 09/06/21 0638  WBC 5.3 5.2  HGB 7.3* 8.2*  HCT 23.8* 26.7*  MCV 97.9 97.1  PLT 246 222    CBG: Recent Labs  Lab 09/05/21 0625 09/05/21 1142 09/05/21 1610 09/05/21 2048 09/06/21 0638  GLUCAP 189* 98 226* 153* 127*    Microbiology: Results for orders placed or performed during the hospital encounter of 08/16/21  Resp Panel by RT-PCR (Flu A&B, Covid) Nasopharyngeal Swab     Status: None   Collection Time: 08/16/21  8:28 PM   Specimen: Nasopharyngeal Swab; Nasopharyngeal(NP) swabs in vial transport medium  Result Value Ref Range Status   SARS Coronavirus 2 by RT PCR NEGATIVE NEGATIVE Final    Comment: (NOTE) SARS-CoV-2 target nucleic acids are NOT DETECTED.  The SARS-CoV-2 RNA is generally detectable in upper respiratory specimens during the acute phase of infection. The lowest concentration of SARS-CoV-2 viral copies this assay can detect is 138 copies/mL. A negative result does not preclude SARS-Cov-2 infection and should not be used as the sole basis for treatment or other patient management decisions. A negative result may occur with  improper  specimen collection/handling, submission of specimen other than nasopharyngeal swab, presence of viral mutation(s) within the areas targeted by this assay, and inadequate number of viral copies(<138 copies/mL). A negative result must be combined with clinical observations, patient history, and epidemiological information. The expected result is Negative.  Fact Sheet for Patients:  EntrepreneurPulse.com.au  Fact Sheet for Healthcare Providers:  IncredibleEmployment.be  This test is no t yet approved or cleared by the [place] FDA and  has been authorized for detection  and/or diagnosis of SARS-CoV-2 by FDA under an Emergency Use Authorization (EUA). This EUA will remain  in effect (meaning this test can be used) for the duration of the COVID-19 declaration under Section 564(b)(1) of the Act, 21 U.S.C.section 360bbb-3(b)(1), unless the authorization is terminated  or revoked sooner.       Influenza A by PCR NEGATIVE NEGATIVE Final   Influenza B by PCR NEGATIVE NEGATIVE Final    Comment: (NOTE) The Xpert Xpress SARS-CoV-2/FLU/RSV plus assay is intended as an aid in the diagnosis of influenza from Nasopharyngeal swab specimens and should not be used as a sole basis for treatment. Nasal washings and aspirates are unacceptable for Xpert Xpress SARS-CoV-2/FLU/RSV testing.  Fact Sheet for Patients: EntrepreneurPulse.com.au  Fact Sheet for Healthcare Providers: IncredibleEmployment.be  This test is not yet approved or cleared by the [place] FDA and has been authorized for detection and/or diagnosis of SARS-CoV-2 by FDA under an Emergency Use Authorization (EUA). This EUA will remain in effect (meaning this test can be used) for the duration of the COVID-19 declaration under Section 564(b)(1) of the Act, 21 U.S.C. section 360bbb-3(b)(1), unless the authorization is terminated or revoked.  Performed at [facility], [address], [place], [place] [address]   Urine Culture     Status: None   Collection Time: 08/17/21 11:00 AM   Specimen: Urine, Clean Catch  Result Value Ref Range Status   Specimen Description   Final    URINE, CLEAN CATCH Performed at [facility], [address], [place], [place] [address]    Special Requests   Final    NONE Performed at [facility], [address], [place], [place] [address]    Culture   Final    NO GROWTH Performed at [facility], [place] [address], [place], [place] [address]    Report Status 08/19/2021 FINAL  Final  Culture, blood (Routine X 2) w Reflex to ID  Panel     Status: None   Collection Time: 08/19/21  7:28 AM   Specimen: BLOOD LEFT ARM  Result Value Ref Range Status   Specimen Description BLOOD LEFT ARM BOTTLES DRAWN AEROBIC AND ANAEROBIC  Final   Special Requests   Final    Blood Culture results may not be optimal due to an excessive volume of blood received in culture bottles   Culture   Final    NO GROWTH 5 DAYS Performed at [facility], [address], [place], [place] [address]    Report Status 08/24/2021 FINAL  Final  Culture, blood (Routine X 2) w Reflex to ID Panel     Status: None   Collection Time: 08/19/21  8:27 AM   Specimen: BLOOD LEFT HAND  Result Value Ref Range Status   Specimen Description   Final    BLOOD LEFT HAND BOTTLES DRAWN AEROBIC AND ANAEROBIC   Special Requests Blood Culture adequate volume  Final   Culture   Final    NO GROWTH 5 DAYS Performed at [facility], [address], [place], [place] [address]    Report Status 08/24/2021 FINAL  Final  MRSA Next Gen by PCR, Nasal     Status: Abnormal   Collection Time: 08/21/21  5:40 PM   Specimen: Nasal Mucosa; Nasal Swab  Result Value Ref Range Status   MRSA by PCR Next Gen DETECTED (A) NOT DETECTED Final    Comment: RESULT CALLED TO, READ BACK BY AND VERIFIED WITH: JESSICA HEARN RN,2021,08/21/2021, SELF S. (NOTE) The GeneXpert MRSA Assay (FDA approved for NASAL specimens only), is one component of a comprehensive MRSA colonization surveillance program. It is not intended to diagnose MRSA infection nor to guide or monitor treatment for MRSA infections. Test performance is not FDA approved in patients less than 27 years old. Performed at [facility], [address] [place], [place] [address]   MRSA Next Gen by PCR, Nasal     Status: Abnormal   Collection Time: 08/26/21 12:49 AM   Specimen: Nasal Mucosa; Nasal Swab  Result Value Ref Range Status   MRSA by PCR Next Gen DETECTED (A) NOT DETECTED Final    Comment: RESULT CALLED TO, READ BACK BY AND  VERIFIED WITH: RN DAVID S.  [id number] FCP (NOTE) The GeneXpert MRSA Assay (FDA approved for NASAL specimens only), is one component of a comprehensive MRSA colonization surveillance program. It is not intended to diagnose MRSA infection nor to guide or monitor treatment for MRSA infections. Test performance is not FDA approved in patients less than 29 years old. Performed at [facility], [place] [address] [place], [place] [address]   Respiratory (~20 pathogens) panel by PCR     Status: None   Collection Time: 08/26/21  9:31 AM   Specimen: Nasopharyngeal Swab; Respiratory  Result Value Ref Range Status   Adenovirus NOT DETECTED NOT DETECTED Final   Coronavirus 229E NOT DETECTED NOT DETECTED Final    Comment: (NOTE) The Coronavirus on the Respiratory Panel, DOES NOT test for the novel  Coronavirus (2019 nCoV)    Coronavirus HKU1 NOT DETECTED NOT DETECTED Final   Coronavirus NL63 NOT DETECTED NOT DETECTED Final   Coronavirus OC43 NOT DETECTED NOT DETECTED Final   Metapneumovirus NOT DETECTED NOT DETECTED Final   Rhinovirus / Enterovirus NOT DETECTED NOT DETECTED Final   Influenza A NOT DETECTED NOT DETECTED Final   Influenza B NOT DETECTED NOT DETECTED Final   Parainfluenza Virus 1 NOT DETECTED NOT DETECTED Final   Parainfluenza Virus 2 NOT DETECTED NOT DETECTED Final   Parainfluenza Virus 3 NOT DETECTED NOT DETECTED Final   Parainfluenza Virus 4 NOT DETECTED NOT DETECTED Final   Respiratory Syncytial Virus NOT DETECTED NOT DETECTED Final   Bordetella pertussis NOT DETECTED NOT DETECTED Final   Bordetella Parapertussis NOT DETECTED NOT DETECTED Final   Chlamydophila pneumoniae NOT DETECTED NOT DETECTED Final   Mycoplasma pneumoniae NOT DETECTED NOT DETECTED Final    Comment: Performed at [facility], [place]. [address] [place], [place] [address]    Imaging: No results found.   Medications:    sodium chloride 250 mL (08/26/21 1235)    allopurinol  100 mg Oral  Daily   aspirin EC  81 mg Oral Daily   atorvastatin  80 mg Oral QHS   Chlorhexidine Gluconate Cloth  6 each Topical Daily   diltiazem  60 mg Oral Q8H   feeding supplement (NEPRO CARB STEADY)  237 mL Oral TID WC   heparin injection (subcutaneous)  5,000 Units Subcutaneous Q8H  insulin aspart  0-5 Units Subcutaneous QHS   insulin aspart  0-9 Units Subcutaneous TID WC   insulin aspart  4 Units Subcutaneous TID WC   insulin detemir  17 Units Subcutaneous QHS   labetalol  200 mg Oral BID   lipase/protease/amylase  36,000-72,000 Units Oral See admin instructions   pantoprazole  40 mg Oral Daily   sodium chloride flush  3 mL Intravenous Q12H   sodium chloride, acetaminophen, cyclobenzaprine, diphenhydrAMINE, diphenoxylate-atropine, haloperidol lactate, HYDROmorphone, ipratropium-albuterol, LORazepam, meclizine, ondansetron **OR** ondansetron (ZOFRAN) IV, sodium chloride flush, temazepam  Assessment/ Plan:  AKI/CKD stage IIIb - in setting of PNA and hypertensive urgency and chronic diarrhea.  Baseline Scr variable 1.6-2.2 and follows Dr. Theador Hawthorne as an outpatient. Required HD 10/25 due to hyperkalemia.  Initial AKI improved and worsened between 10/30 and 11/3. Now improving again.  Serologies negative.  Her second AKI was likely related to hemodynamic changes due to fluctuating Bps. Now slowly improving after decreasing HTN meds Slowly improving with supportive care Strict ins/outs NS at 75/hr x 12 hours  Hyperkalemia - kayexalate once today - unchanged with lokelma past couple of days.  Changed to renal diet and to nepro.  DM per primary team. Emphasize low potassium diet and DM control  Acute hypoxic respiratory failure - due to multifocal pneumonia vs CHF.  S/p antibiotics. V/Q negative. Much improved. Careful with hydration HTN urgency/emergency -much improved at this time.  Hydralazine was stopped due to intermittently low blood pressures.   AMS  much improved; multifactorial Anemia of  critical illness - s/p transfusion.  iron is replete.  aranesp 40 mcg once on 11/8. Per charting also rec'd PRBC's. Thrombocytopenia -now normalized.  Possibly was related to hypertension  Disposition per primary team.  She has nephrology follow-up with Dr. Theador Hawthorne (her nephrologist) next week.  Ideal to get additional IV fluids but ok with me if she is discharged today from a renal standpoint  Claudia Desanctis, MD 09/06/2021 7:02 AM

## 2021-09-06 NOTE — Progress Notes (Signed)
[facility] night cross cover note:  I was contacted by RN requesting clarification regarding labs for this morning.  Specifically, RN conveyed to me that, following yesterday morning's Hgb of 7.3, that patient underwent ensuing transfusion of 1 unit PRBC, without posttransfusion recheck of hemoglobin, and no existing order for checking CBC this morning.  Given this information, I placed order to check CBC this morning.     Babs Bertin, DO Hospitalist

## 2021-09-06 NOTE — Discharge Summary (Signed)
Physician Discharge Summary  AIRLIE BLUMENBERG JKD:[id number] DOB: 1950-11-04 DOA: 08/16/2021  PCP: [name], MD  Admit date: 08/16/2021 Discharge date: 09/06/2021  Admitted From: home Disposition:  [facility]  Recommendations for Outpatient Follow-up:  Follow up with PCP in 1-2 weeks Please obtain BMP/CBC in a  week  Home Health:yes PTOT  Equipment/Devices: NONE  Discharge Condition: Stable Code Status:   Code Status: DNR Diet recommendation:  Diet Order             Diet renal/carb modified with fluid restriction Diet-HS Snack? Nothing; Fluid restriction: Other (see comments); Room service appropriate? Yes with Assist; Fluid consistency: Thin  Diet effective now                    Brief/Interim Summary:  70 y.o. female with PMH of CKD stage IIIb, Crohn's disease, hypertension, diabetes mellitus type 2, CVA presented with blurry vision x 2 days w/ stabbing headache, nausea vomiting. She was seen in the ED, underwent- Brain MRI/MRA showed no acute changes.Further work-up with CT chest showed multifocal pneumonia, bilateral pleural effusion, pulmonary edema.  Patient was started on IV antibiotics and also received furosemide but had worsening renal function requiring renal replacement therapy x2. Patient was treated with ceftriaxone and vancomycin for atypical infection, before that she was on cefepime and linezolid.After 9 days of therapy her antibiotics have been discontinued.  She is followed very closely by nephrology.  She continues to do well.  At this time mental status is stable baseline.  She is ambulatory.  PT OT assessed at home health PT.  Renal function slowly improving having good urine output, potassium borderline high, discussed with nephrology-after giving few hours of IV hydration and Kayexalate x1 she can be discharged today, has close follow-up with Dr. Posey Pronto and her nephrology next week.  Advised to check CBC BMP in a week time.  Repeat Kayexalate x1 tomorrow at  home prescription will be sent. Nephrology  Discharge Diagnoses:   AKI on CKD stage IIIb: Baseline creatinine 1.6-2.2 followed by Dr. Theador Hawthorne.AKI in the setting of pneumonia, hypertension urgency chronic diarrhea. Required HD 10/25 due to hyperkalemia, AKI improved but worsened on 10/30 and 11/3. so far serologies negative, second AKI likely due to hemodynamic changes due to fluctuating BPs.  Appreciate nephrology input hypertension meds decreased.  Patient was kept on IV fluids until this afternoon.  Creatinine improving.  Continue with low potassium diet at home.Temporary HD catheter removed 11/6 Recent Labs  Lab 09/02/21 0254 09/03/21 0304 09/04/21 0300 09/05/21 0257 09/06/21 0153  BUN 77* 73* 70* 68* 66*  CREATININE 4.51* 4.12* 3.85* 3.79* 3.60*   Hyperkalemia potassium is borderline high, given Kayexalate x1 and repeat dose tomorrow at home.  Follow-up BMP within a week with her nephrology continue with low potassium diet, dietitian consulted here to educate. Recent Labs  Lab 09/02/21 0254 09/03/21 0304 09/04/21 0300 09/05/21 0257 09/06/21 0153  K 5.3* 5.1 5.3* 5.4* 5.4*    Acute hypoxic respiratory failure due to multifocal pneumonia  vs CHF: Resolved.  Off oxygen.  Echo showed normal EF 60 to 65% with no regional wall motion abnormalities and LV diastolic parameters were normal.  Multifocal pneumonia: Completed antibiotics.  Currently afebrile. Last wbc counts normal Recent Labs  Lab 09/05/21 0257 09/06/21 0638  WBC 5.3 5.2    Hypertension urgency/emergency: Leading to renal failure.  Currently well controlled on Cardizem 60 every 8 hours> changed to 180 mg daily on discharge and cont labetalol.  she is  off clonidine, metoprolol and hydralazine.  Acute metabolic encephalopathy: Multifactorial, resolved, alert awake and interactive.  Ambulating well.  PTOT at home  Anemia of critical illness: S/P transfusion x2 on 1/23-and again 11/8 hemoglobin improved 8.2 g check  hemoglobin next week  Recent Labs  Lab 09/05/21 0257 09/06/21 0638  HGB 7.3* 8.2*  HCT 23.8* 26.7*    Thrombocytopenia resolved  Blurry vision, bilateral: Brain MRI/MRA no acute changes.  Denies any vision issues at this time.   Crohn's disease on immunosuppressive with Humira at home. Stable  Type 2 diabetes: She will resume home insulin regimen follow-up with PCP.Her last  A1c stable 6.1 on 10/30 Recent Labs  Lab 09/05/21 1142 09/05/21 1610 09/05/21 2048 09/06/21 0638 09/06/21 1214  GLUCAP 98 226* 153* 127* 137*   Pressure ulcer of the right buttock stage I POA as below Pressure Injury 08/26/21 Buttocks Right;Left Stage 1 -  Intact skin with non-blanchable redness of a localized area usually over a bony prominence. (Active)  08/26/21 0050  Location: Buttocks  Location Orientation: Right;Left  Staging: Stage 1 -  Intact skin with non-blanchable redness of a localized area usually over a bony prominence.  Wound Description (Comments):   Present on Admission: Yes   Plan of care discussed with patient's husband over the phone and all coiffed questions answered.  Consults: nephro  Subjective: Alert awake oriented not in distress.  Resting comfortably.  Eager to go home today. Discharge Exam: Vitals:   09/06/21 0758 09/06/21 1219  BP: (!) 151/58 (!) 130/58  Pulse: 73 70  Resp:  18  Temp: 97.8 F (36.6 C) 97.9 F (36.6 C)  SpO2: 96% 93%   General: Pt is alert, awake, not in acute distress Cardiovascular: RRR, S1/S2 +, no rubs, no gallops Respiratory: CTA bilaterally, no wheezing, no rhonchi Abdominal: Soft, NT, ND, bowel sounds + Extremities: no edema, no cyanosis  Discharge Instructions  Discharge Instructions     Discharge instructions   Complete by: As directed    You need to check your blood count for potassium level in the next week, follow-up with your kidney doctor or your primary care doctor.  Please call call MD or return to ER for similar or  worsening recurring problem that brought you to hospital or if any fever,nausea/vomiting,abdominal pain, uncontrolled pain, chest pain,  shortness of breath or any other alarming symptoms.  Please follow-up your doctor as instructed in a week time and call the office for appointment.  Check blood sugar 3 times a day and bedtime at home. If blood sugar running above 200 less than 70 please call your MD to adjust insulin. If blood sugars running less 100 do not use insulin and call MD. If you noticed signs and symptoms of hypoglycemia or low blood sugar like jitteriness, confusion, thirst, tremor, sweating- Check blood sugar, drink sugary drink/biscuits/sweets to increase sugar level and call MD or return to ER.  Please avoid alcohol, smoking, or any other illicit substance and maintain healthy habits including taking your regular medications as prescribed.  You were cared for by a hospitalist during your hospital stay. If you have any questions about your discharge medications or the care you received while you were in the hospital after you are discharged, you can call the unit and ask to speak with the hospitalist on call if the hospitalist that took care of you is not available.  Once you are discharged, your primary care physician will handle any further medical issues. Please note that  NO REFILLS for any discharge medications will be authorized once you are discharged, as it is imperative that you return to your primary care physician (or establish a relationship with a primary care physician if you do not have one) for your aftercare needs so that they can reassess your need for medications and monitor your lab values   Increase activity slowly   Complete by: As directed    No wound care   Complete by: As directed       Allergies as of 09/06/2021       Reactions   Duloxetine Hcl Swelling   Fluocinolone Swelling   Acetaminophen Itching, Swelling   Pt states "some swelling"   Amlodipine  Besylate Swelling   Swelling of feet, fluid retention   Ciprofloxacin Nausea And Vomiting, Swelling   Swelling of face, jaw, and lips   Hydrocodone-acetaminophen Itching   Metformin Other (See Comments)   REACTION: GI UPSET   Nsaids Other (See Comments)   "HAS BLEEDING ULCERS"   Quinolones Swelling   Sulfamethoxazole Swelling   Swelling of feet, legs   Valsartan Swelling   Cozaar [losartan] Swelling, Rash   Lisinopril Swelling, Rash   Oral swelling, and red streaks on arms/stomach   Tape Itching, Rash   Paper tape can only be used on this patient        Medication List     STOP taking these medications    cloNIDine 0.2 MG tablet Commonly known as: CATAPRES   furosemide 40 MG tablet Commonly known as: LASIX   gabapentin 300 MG capsule Commonly known as: NEURONTIN   hydrALAZINE 25 MG tablet Commonly known as: APRESOLINE   indomethacin 50 MG capsule Commonly known as: INDOCIN   metoprolol tartrate 100 MG tablet Commonly known as: LOPRESSOR   potassium chloride SA 20 MEQ tablet Commonly known as: KLOR-CON   tiZANidine 4 MG tablet Commonly known as: ZANAFLEX       TAKE these medications    acetaminophen 650 MG CR tablet Commonly known as: TYLENOL Take 650 mg by mouth every 8 (eight) hours as needed for pain.   albuterol 108 (90 Base) MCG/ACT inhaler Commonly known as: VENTOLIN HFA Inhale 1-2 puffs into the lungs every 6 (six) hours as needed for shortness of breath or wheezing.   allopurinol 100 MG tablet Commonly known as: ZYLOPRIM Take 100 mg by mouth daily.   aspirin 81 MG EC tablet Take 1 tablet (81 mg total) by mouth daily. Swallow whole. Start taking on: September 07, 2021   atorvastatin 80 MG tablet Commonly known as: LIPITOR Take 80 mg by mouth every morning.   citalopram 20 MG tablet Commonly known as: CELEXA Take 1 tablet (20 mg total) by mouth daily.   cyanocobalamin 1000 MCG/ML injection Commonly known as: (VITAMIN B-12) Inject  1,000 mcg into the muscle every 30 (thirty) days.   dicyclomine 20 MG tablet Commonly known as: BENTYL Take 1 tablet (20 mg total) by mouth 3 (three) times daily before meals. What changed: when to take this   diltiazem 180 MG 24 hr capsule Commonly known as: Cardizem CD Take 1 capsule (180 mg total) by mouth daily.   diphenoxylate-atropine 2.5-0.025 MG tablet Commonly known as: Lomotil 1-2 tablets qid prn diarrhea Instead of Imodium   esomeprazole 40 MG capsule Commonly known as: NEXIUM Take 40 mg by mouth daily before breakfast.   feeding supplement (NEPRO CARB STEADY) Liqd Take 237 mLs by mouth 3 (three) times daily with meals for 14 days.  fenofibrate 145 MG tablet Commonly known as: Tricor Take 1 tablet (145 mg total) by mouth daily.   Humira Pen 40 MG/0.4ML Pnkt Generic drug: Adalimumab Inject 40 mg into the skin every 14 (fourteen) days. What changed: Another medication with the same name was removed. Continue taking this medication, and follow the directions you see here.   HYDROmorphone 2 MG tablet Commonly known as: Dilaudid Take 0.5 tablets (1 mg total) by mouth every 4 (four) hours as needed for severe pain.   insulin lispro 100 UNIT/ML KwikPen Commonly known as: HumaLOG KwikPen Inject 4 Units into the skin 3 (three) times daily with meals. Sliding scale What changed:  medication strength how much to take   labetalol 200 MG tablet Commonly known as: NORMODYNE Take 1 tablet (200 mg total) by mouth 2 (two) times daily.   Levemir FlexTouch 100 UNIT/ML FlexPen Generic drug: insulin detemir Inject 17 Units into the skin at bedtime. What changed: how much to take   lipase/protease/amylase 36000 UNITS Cpep capsule Commonly known as: CREON Take 1 capsule (36,000 Units total) by mouth with snacks. What changed:  how much to take when to take this additional instructions   meclizine 25 MG tablet Commonly known as: ANTIVERT Take 1 tablet by mouth as  needed for dizziness or nausea.   omega-3 acid ethyl esters 1 g capsule Commonly known as: LOVAZA Take 3 capsules by mouth 3 (three) times daily.   promethazine 25 MG tablet Commonly known as: PHENERGAN Take 1 tablet (25 mg total) by mouth every 6 (six) hours as needed for nausea or vomiting.   Retacrit 3000 UNIT/ML injection Generic drug: epoetin alfa-epbx Inject 3,000 Units into the skin every 14 (fourteen) days.   sodium polystyrene powder Commonly known as: KAYEXALATE Take 30 gm one time 09/07/21 Start taking on: September 07, 2021   temazepam 15 MG capsule Commonly known as: RESTORIL Take 15 mg by mouth at bedtime as needed for sleep.   venlafaxine XR 150 MG 24 hr capsule Commonly known as: EFFEXOR-XR Take 150 mg by mouth daily.   vitamin C with rose hips 500 MG tablet Take 500 mg by mouth every morning.   Vitamin D 50 MCG (2000 UT) Caps Take 2,000 Units by mouth every morning.        Follow-up Information     [facility]. Schedule an appointment as soon as possible for a visit in 1 month(s).   Specialty: Neurology Why: stroke clinic Contact information: [address] [place] [phone]        [name], MD Follow up.   Specialty: Internal Medicine Why: To check CBC and BMP Contact information: [place] [address] [phone]         Liana Gerold, MD Follow up in 1 week(s).   Specialty: Nephrology Contact information: [address] [place] [address] [phone]                Allergies  Allergen Reactions   Duloxetine Hcl Swelling   Fluocinolone Swelling   Acetaminophen Itching and Swelling    Pt states "some swelling"   Amlodipine Besylate Swelling    Swelling of feet, fluid retention   Ciprofloxacin Nausea And Vomiting and Swelling    Swelling of face, jaw, and lips   Hydrocodone-Acetaminophen Itching   Metformin Other (See  Comments)    REACTION: GI UPSET   Nsaids Other (See Comments)    "HAS BLEEDING ULCERS"  Quinolones Swelling   Sulfamethoxazole Swelling    Swelling of feet, legs   Valsartan Swelling   Cozaar [Losartan] Swelling and Rash   Lisinopril Swelling and Rash    Oral swelling, and red streaks on arms/stomach   Tape Itching and Rash    Paper tape can only be used on this patient    The results of significant diagnostics from this hospitalization (including imaging, microbiology, ancillary and laboratory) are listed below for reference.    Microbiology: No results found for this or any previous visit (from the past 240 hour(s)).  Procedures/Studies: CT CHEST WO CONTRAST  Result Date: 08/25/2021 CLINICAL DATA:  Respiratory illness, shortness of breath EXAM: CT CHEST WITHOUT CONTRAST TECHNIQUE: Multidetector CT imaging of the chest was performed following the standard protocol without IV contrast. COMPARISON:  CT chest 12/22/2014 FINDINGS: Cardiovascular: Heart is enlarged. No pericardial effusion identified. Coronary artery calcifications. Main pulmonary artery is normal caliber. Thoracic aorta is normal in course and caliber with mild calcified plaques. Mediastinum/Nodes: A few mildly enlarged mediastinal lymph nodes identified measuring up to 1.2 cm in short axis precarinal. No bulky axillary lymphadenopathy or definite hilar lymphadenopathy identified, given limitations of a noncontrast study. Right-sided central line noted. Lungs/Pleura: Extensive bilateral increased interstitial and irregular consolidative and ground-glass airspace opacities throughout all lobes. Trace right pleural effusion. Minor subsegmental atelectasis at the lung bases. No pneumothorax. Upper Abdomen: Subtle nodularity of the liver suggesting cirrhosis. Spleen is enlarged measuring 13 cm in length. Musculoskeletal: No suspicious bony lesions visualized. IMPRESSION: 1. Bilateral extensive interstitial and alveolar densities  which likely represent pneumonia. Trace right pleural effusion. Continued follow-up recommended. 2. Cardiomegaly. 3. Evidence of hepatic cirrhosis. Splenomegaly which could represent portal hypertension. Electronically Signed   By: Ofilia Neas M.D.   On: 08/25/2021 11:57   MR ANGIO HEAD WO CONTRAST  Result Date: 08/28/2021 CLINICAL DATA:  Neuro deficit, stroke seen on 08/27/2021 MRI head EXAM: MRA HEAD WITHOUT CONTRAST TECHNIQUE: Angiographic images of the Circle of Willis were acquired using MRA technique without intravenous contrast. COMPARISON:  08/16/2021 FINDINGS: Anterior circulation: Both internal carotid arteries are patent to the termini, without stenosis or other abnormality. Hypoplastic left A1. Better visualization of the distal left A1 and proximal left A2 than on the prior exam. Normal right A1. Normal anterior communicating artery. Anterior cerebral arteries are patent to their distal aspects. No M1 stenosis or occlusion. Normal MCA bifurcations. Distal MCA branches perfused and symmetric. Posterior circulation: Vertebral arteries widely patent to the vertebrobasilar junction without stenosis. Posterior inferior cerebral arteries patent bilaterally. Basilar patent to its distal aspect. Superior cerebral arteries patent bilaterally. Hypoplastic left P1 with near fetal origin of the left PCA, with a prominent left posterior communicating artery. The right posterior communicating artery is not visualized. Normal right P1. PCAs well perfused to their distal aspects without stenosis. Anatomic variants: None significant Other: None. IMPRESSION: 1. No intracranial large vessel occlusion. 2. Better visualization of the distal left A1 and proximal left A2 than on the prior exam, likely hypoplastic but without focal stenosis. Electronically Signed   By: Merilyn Baba M.D.   On: 08/28/2021 18:01   MR ANGIO HEAD WO CONTRAST  Result Date: 08/16/2021 CLINICAL DATA:  Diplopia; Vision loss, binocular  diplopia EXAM: MRA HEAD WITHOUT CONTRAST MRV HEAD WITHOUT CONTRAST TECHNIQUE: Angiographic images of the Circle of Willis were acquired using MRA technique without intravenous contrast. Angiographic images of the intracranial venous structures were acquired using MRV technique without intravenous contrast. COMPARISON:  MRA July  21 21. FINDINGS: MRA HEAD FINDINGS Anterior circulation: Chronically small left ACA with suspected superimposed distal left A1 and proximal A2 stenosis, possibly severe. Right A1 ACA is patent without significant stenosis. Preserved flow related signal in more distal ACAs bilaterally. Bilateral intracranial ICAs, MCAs are patent without proximal hemodynamically significant stenosis. Posterior circulation: Bilateral intradural dural vertebral arteries, basilar artery and posterior cerebral arteries are patent without proximal hemodynamically significant stenosis. Similar left posterior communicating artery, which appears despite left posterior cerebral artery territory. Similar suspected small or stenotic left P1 PCA. No aneurysm identified. Anatomic variants: Detailed above. MRV HEAD FINDINGS The in superior sagittal sinus, transverse and sigmoid sinuses, jugular bulbs, straight sinus, and visible deep cerebral veins are patent. The left transverse sinus is or narrowed. IMPRESSION: 1. No proximal large vessel occlusion. 2. Chronically small left ACA with suspected distal left A1 and proximal A2 stenosis, possibly severe. A CTA could confirm and further characterize if clinically indicated. 3. No evidence of dural venous sinus thrombosis. Electronically Signed   By: Margaretha Sheffield M.D.   On: 08/16/2021 18:38   MR BRAIN WO CONTRAST  Result Date: 08/27/2021 CLINICAL DATA:  Delirium.  Patient motion despite Ativan sedation EXAM: MRI HEAD WITHOUT CONTRAST TECHNIQUE: Multiplanar, multiecho pulse sequences of the brain and surrounding structures were obtained without intravenous contrast.  COMPARISON:  08/16/2021 FINDINGS: Brain: Small acute or early subacute infarcts in the left cerebellum and left frontal operculum since prior. No acute hemorrhage, hydrocephalus, or mass/finding. Remote bilateral globus pallidus insult. Slightly hyperintense left posterior putamen but no restricted diffusion or swelling, likely stable in from remote insult. Vascular: Major flow voids are preserved. Mild chronic small vessel ischemia in the hemispheric white matter Skull and upper cervical spine: Normal marrow signal.  C3-4 ACDF Sinuses/Orbits: Negative IMPRESSION: Very motion degraded study but still positive for small acute infarcts in the left cerebellum and left frontal operculum since study 11 days ago. Electronically Signed   By: Jorje Guild M.D.   On: 08/27/2021 11:20   MR BRAIN WO CONTRAST  Result Date: 08/16/2021 CLINICAL DATA:  Headache, new or worsening. Additional history provided: Patient reports blurry vision for 2 days, worsening. Patient also reports occipital head pain which radiates down back. EXAM: MRI HEAD WITHOUT CONTRAST TECHNIQUE: Multiplanar, multiecho pulse sequences of the brain and surrounding structures were obtained without intravenous contrast. COMPARISON:  Prior brain MRI examinations 05/18/2021 and earlier FINDINGS: Brain: Mild intermittent motion degradation. Mild generalized cerebral and cerebellar atrophy. There are symmetric subcentimeter remote insults within the globus pallidus bilaterally, a new finding as compared to prior exams (series 15, image 24). These may reflect chronic lacunar infarcts or sequela of a toxic/metabolic insult. Mild multifocal T2 FLAIR hyperintense signal abnormality within the cerebral white matter and pons, progressed from the prior MRI of 05/17/2020. Unchanged 6 mm dural-based mass overlying the anterior right frontal lobe, consistent with a small incidental meningioma. There is no acute infarct. No extra-axial fluid collection. No midline  shift. Vascular: Maintained flow voids within the proximal large arterial vessels. Skull and upper cervical spine: No focal suspicious marrow lesion. Susceptibility artifact arising from ACDF hardware. Sinuses/Orbits: Visualized orbits show no acute finding. Bilateral lens replacements. No significant paranasal sinus disease. Other: Small-volume fluid within the left mastoid air cells. IMPRESSION: Mildly motion degraded exam. No evidence of acute intracranial abnormality. Small symmetric remote insults within the globus pallidus bilaterally, a new finding as compared to prior exams. These may reflect chronic lacunar infarcts or sequela of a remote toxic/metabolic  insult. Mild chronic small vessel ischemic changes within the cerebral white matter and pons, progressed. Unchanged size of a 6 mm meningioma overlying the right frontal lobe. Mild generalized parenchymal atrophy. Small-volume fluid within the left mastoid air cells. Electronically Signed   By: Kellie Simmering D.O.   On: 08/16/2021 15:17   NM Pulmonary Perfusion  Result Date: 08/19/2021 CLINICAL DATA:  Shortness of breath and chest pain. Positive D-dimer. EXAM: NUCLEAR MEDICINE PERFUSION LUNG SCAN TECHNIQUE: Perfusion images were obtained in multiple projections after intravenous injection of radiopharmaceutical. Ventilation scans intentionally deferred if perfusion scan and chest x-ray adequate for interpretation during COVID 19 epidemic. RADIOPHARMACEUTICALS:  4.4 mCi Tc-51m MAA IV COMPARISON:  Chest radiography same day. FINDINGS: No clear segmental or subsegmental defect. Mild diffuse patchy pattern consistent with the abnormal chest radiography showing widespread bilateral airspace filling. Consider CT scan of the chest (without contrast if the patient cannot tolerate contrast administration) for better evaluation of parenchymal disease. IMPRESSION: No arterial filling defect to suggest embolic disease. Mild patchy pattern consistent with a  widespread airspace disease shown by radiography earlier today. Electronically Signed   By: Nelson Chimes M.D.   On: 08/19/2021 13:40   US RENAL  Result Date: 08/18/2021 CLINICAL DATA:  Acute kidney injury EXAM: RENAL / URINARY TRACT ULTRASOUND COMPLETE COMPARISON:  CT 01/07/2021 FINDINGS: Right Kidney: Renal measurements: 10.1 x 4.9 x 5.3 cm = volume: 139 mL. Slightly increased parenchymal echogenicity. No evidence of cyst, mass, stone or hydronephrosis. Left Kidney: Renal measurements: 9.6 x 5.4 x 5.1 cm = volume: 140 mL. Slightly increased echogenicity. 1 cm cyst in the midportion. Bladder: Appears normal for degree of bladder distention. Other: None. IMPRESSION: Normal size kidneys without hydronephrosis. Slight increased echogenicity of the renal parenchyma. Electronically Signed   By: Nelson Chimes M.D.   On: 08/18/2021 12:48   DG Chest Port 1 View  Result Date: 08/27/2021 CLINICAL DATA:  Follow-up pulmonary airspace process. EXAM: PORTABLE CHEST 1 VIEW COMPARISON:  Chest x-ray 08/21/2021 and chest CT 08/17/2021 FINDINGS: The right IJ central venous catheter is stable. The cardiac silhouette, mediastinal and hilar contours are within normal limits unchanged. Much improved lung aeration with resolving interstitial and airspace process, likely resolving pulmonary edema. No pleural effusions. No pneumothorax. IMPRESSION: Much improved lung aeration, likely resolving pulmonary edema. Electronically Signed   By: Marijo Sanes M.D.   On: 08/27/2021 12:14   DG CHEST PORT 1 VIEW  Result Date: 08/21/2021 CLINICAL DATA:  Hypoxemia EXAM: PORTABLE CHEST 1 VIEW COMPARISON:  08/20/2021, 08/19/2021, 03/29/2021 FINDINGS: Right IJ central venous catheter tip over the cavoatrial region. Cardiomegaly. Slight worsening of diffuse bilateral airspace disease. No pneumothorax. Hardware in the cervicothoracic spine. IMPRESSION: 1. Slight interval worsening of diffuse bilateral airspace disease which may be secondary to  edema or infection 2. Cardiomegaly Electronically Signed   By: Donavan Foil M.D.   On: 08/21/2021 15:19   DG Chest Port 1 View  Result Date: 08/20/2021 CLINICAL DATA:  Status post dialysis catheter placement. EXAM: PORTABLE CHEST 1 VIEW COMPARISON:  08/19/2021 FINDINGS: Right IJ catheter has been placed with tip at the cavoatrial junction. No pneumothorax visualized. Unchanged cardiac enlargement. Persistent bilateral nodular and confluent airspace opacities involving the upper and lower lung zones. The appearance is not significantly changed compared with previous exam. No signs of pleural effusion. IMPRESSION: 1. Interval placement of right IJ catheter with tip at the cavoatrial junction. 2. No change in aeration to the lungs compared with previous exam. Electronically Signed  By: Kerby Moors M.D.   On: 08/20/2021 12:57   DG Chest Port 1 View  Result Date: 08/19/2021 CLINICAL DATA:  New onset shortness of breath. History of hypertension, diabetes and pneumonia. Negative COVID-19 test 3 days ago. EXAM: PORTABLE CHEST 1 VIEW COMPARISON:  02/27/2021 FINDINGS: Multifocal bilateral areas of airspace disease identified involving both upper and lower lung zones. Some of these opacities have a nodular configuration. New bilateral hilar prominence is identified which may reflect underlying adenopathy. Trace pulmonary edema. No signs of pleural effusion. IMPRESSION: 1. Multifocal bilateral airspace disease. Some of these opacities have a nodular configuration. In the acute setting findings are consistent with multifocal pneumonia. Given the nodular configuration of some of these opacities metastatic disease would be difficult to exclude. Consider more definitive characterization with CT of the chest with contrast material. 2. New bilateral hilar prominence which may reflect underlying adenopathy. Electronically Signed   By: Kerby Moors M.D.   On: 08/19/2021 06:22   EEG adult  Result Date:  08/26/2021 Derek Jack, MD     08/26/2021  4:19 PM Routine EEG Report VANESS JELINSKI is a 70 y.o. female with a history of encephalopathy who is undergoing an EEG to evaluate for seizures. Report: This EEG was acquired with electrodes placed according to the International 10-20 electrode system (including Fp1, Fp2, F3, F4, C3, C4, P3, P4, O1, O2, T3, T4, T5, T6, A1, A2, Fz, Cz, Pz). The following electrodes were missing or displaced: none. The occipital dominant rhythm was 7-8 Hz with overriding beta frequencies. This activity is reactive to stimulation. Drowsiness was manifested by background fragmentation; deeper stages of sleep were not identified. There was no focal slowing. There were no interictal epileptiform discharges. There were no electrographic seizures identified. Photic stimulation and hyperventilation were not performed. Impression and clinical correlation: This EEG was obtained while awake and drowsy and is abnormal due to mild diffuse slowing indicative of global cerebral dysfunction. No epileptiform abnormalities were observed during this recording. Su Monks, MD [facility] [phone] If 7pm- 7am, please page neurology on call as listed in [place].   MR MRV HEAD WO CM  Result Date: 08/16/2021 CLINICAL DATA:  Diplopia; Vision loss, binocular diplopia EXAM: MRA HEAD WITHOUT CONTRAST MRV HEAD WITHOUT CONTRAST TECHNIQUE: Angiographic images of the Circle of Willis were acquired using MRA technique without intravenous contrast. Angiographic images of the intracranial venous structures were acquired using MRV technique without intravenous contrast. COMPARISON:  MRA July 21 21. FINDINGS: MRA HEAD FINDINGS Anterior circulation: Chronically small left ACA with suspected superimposed distal left A1 and proximal A2 stenosis, possibly severe. Right A1 ACA is patent without significant stenosis. Preserved flow related signal in more distal ACAs bilaterally. Bilateral  intracranial ICAs, MCAs are patent without proximal hemodynamically significant stenosis. Posterior circulation: Bilateral intradural dural vertebral arteries, basilar artery and posterior cerebral arteries are patent without proximal hemodynamically significant stenosis. Similar left posterior communicating artery, which appears despite left posterior cerebral artery territory. Similar suspected small or stenotic left P1 PCA. No aneurysm identified. Anatomic variants: Detailed above. MRV HEAD FINDINGS The in superior sagittal sinus, transverse and sigmoid sinuses, jugular bulbs, straight sinus, and visible deep cerebral veins are patent. The left transverse sinus is or narrowed. IMPRESSION: 1. No proximal large vessel occlusion. 2. Chronically small left ACA with suspected distal left A1 and proximal A2 stenosis, possibly severe. A CTA could confirm and further characterize if clinically indicated. 3. No evidence of dural venous sinus thrombosis. Electronically Signed  By: Margaretha Sheffield M.D.   On: 08/16/2021 18:38   ECHOCARDIOGRAM COMPLETE  Result Date: 08/21/2021    ECHOCARDIOGRAM REPORT   Patient Name:   CHESTER ROMERO Date of Exam: 08/21/2021 Medical Rec #:  [id number]           Height:       64.0 in Accession #:    [id number]          Weight:       166.9 lb Date of Birth:  Nov 02, 1950           BSA:          1.812 m Patient Age:    70 years            BP:           132/90 mmHg Patient Gender: F                   HR:           106 bpm. Exam Location:  [facility] Procedure: 2D Echo, Cardiac Doppler and Color Doppler Indications:    CHF-Acute Diastolic  History:        Patient has prior history of Echocardiogram examinations, most                 recent 05/17/2020. Stroke, Signs/Symptoms:Chest Pain; Risk                 Factors:Diabetes, Hypertension and Dyslipidemia.  Sonographer:    Wenda Low Referring Phys: [id number] [facility]  Sonographer Comments: Image acquisition challenging due to  patient behavioral factors. Patient with AMS is constantly moving. IMPRESSIONS  1. Left ventricular ejection fraction, by estimation, is 60 to 65%. The left ventricle has normal function. The left ventricle has no regional wall motion abnormalities. Left ventricular diastolic parameters were normal.  2. Right ventricular systolic function is normal. The right ventricular size is normal. There is mildly elevated pulmonary artery systolic pressure.  3. Left atrial size was mildly dilated.  4. The mitral valve is normal in structure. Mild mitral valve regurgitation.  5. The aortic valve is tricuspid. Aortic valve regurgitation is not visualized. Mild aortic valve sclerosis is present, with no evidence of aortic valve stenosis.  6. The inferior vena cava is normal in size with greater than 50% respiratory variability, suggesting right atrial pressure of 3 mmHg. Comparison(s): The left ventricular function is unchanged. FINDINGS  Left Ventricle: Left ventricular ejection fraction, by estimation, is 60 to 65%. The left ventricle has normal function. The left ventricle has no regional wall motion abnormalities. The left ventricular internal cavity size was normal in size. There is  no left ventricular hypertrophy. Left ventricular diastolic parameters were normal. Right Ventricle: The right ventricular size is normal. Right vetricular wall thickness was not assessed. Right ventricular systolic function is normal. There is mildly elevated pulmonary artery systolic pressure. The tricuspid regurgitant velocity is 2.70 m/s, and with an assumed right atrial pressure of 8 mmHg, the estimated right ventricular systolic pressure is 65.6 mmHg. Left Atrium: Left atrial size was mildly dilated. Right Atrium: Right atrial size was normal in size. Pericardium: There is no evidence of pericardial effusion. Mitral Valve: The mitral valve is normal in structure. Mild to moderate mitral annular calcification. Mild mitral valve  regurgitation. MV peak gradient, 6.1 mmHg. The mean mitral valve gradient is 3.0 mmHg. Tricuspid Valve: The tricuspid valve is normal in structure. Tricuspid valve regurgitation is trivial. Aortic Valve: The  aortic valve is tricuspid. Aortic valve regurgitation is not visualized. Mild aortic valve sclerosis is present, with no evidence of aortic valve stenosis. Aortic valve mean gradient measures 6.0 mmHg. Aortic valve peak gradient measures 10.8 mmHg. Aortic valve area, by VTI measures 2.83 cm. Pulmonic Valve: The pulmonic valve was grossly normal. Pulmonic valve regurgitation is not visualized. Aorta: The aortic root is normal in size and structure. Venous: The inferior vena cava is normal in size with greater than 50% respiratory variability, suggesting right atrial pressure of 3 mmHg. IAS/Shunts: The interatrial septum was not assessed.  LEFT VENTRICLE PLAX 2D LVIDd:         4.60 cm   Diastology LVIDs:         2.90 cm   LV e' medial:    7.72 cm/s LV PW:         1.10 cm   LV E/e' medial:  13.7 LV IVS:        1.00 cm   LV e' lateral:   14.90 cm/s LVOT diam:     2.00 cm   LV E/e' lateral: 7.1 LV SV:         89 LV SV Index:   49 LVOT Area:     3.14 cm  RIGHT VENTRICLE RV Basal diam:  2.60 cm RV Mid diam:    2.40 cm RV S prime:     10.10 cm/s TAPSE (M-mode): 2.3 cm LEFT ATRIUM             Index        RIGHT ATRIUM           Index LA diam:        3.70 cm 2.04 cm/m   RA Area:     15.50 cm LA Vol (A2C):   66.7 ml 36.82 ml/m  RA Volume:   39.00 ml  21.53 ml/m LA Vol (A4C):   52.4 ml 28.93 ml/m LA Biplane Vol: 62.0 ml 34.23 ml/m  AORTIC VALVE                     PULMONIC VALVE AV Area (Vmax):    2.41 cm      PV Vmax:       0.86 m/s AV Area (Vmean):   2.72 cm      PV Peak grad:  2.9 mmHg AV Area (VTI):     2.83 cm AV Vmax:           164.00 cm/s AV Vmean:          108.000 cm/s AV VTI:            0.315 m AV Peak Grad:      10.8 mmHg AV Mean Grad:      6.0 mmHg LVOT Vmax:         126.00 cm/s LVOT Vmean:         93.600 cm/s LVOT VTI:          0.284 m LVOT/AV VTI ratio: 0.90  AORTA Ao Root diam: 2.60 cm MITRAL VALVE                TRICUSPID VALVE MV Area (PHT): 5.20 cm     TR Peak grad:   29.2 mmHg MV Area VTI:   3.21 cm     TR Vmax:        270.00 cm/s MV Peak grad:  6.1 mmHg MV Mean grad:  3.0 mmHg     SHUNTS MV Vmax:  1.23 m/s     Systemic VTI:  0.28 m MV Vmean:      79.6 cm/s    Systemic Diam: 2.00 cm MV Decel Time: 146 msec MV E velocity: 106.00 cm/s MV A velocity: 82.70 cm/s MV E/A ratio:  1.28 Dorris Carnes MD Electronically signed by Dorris Carnes MD Signature Date/Time: 08/21/2021/4:12:38 PM    Final    VAS US CAROTID  Result Date: 08/28/2021 Carotid Arterial Duplex Study Patient Name:  [name]  Date of Exam:   08/28/2021 Medical Rec #: [id number]            Accession #:    [id number] Date of Birth: October 04, 1951            Patient Gender: F Patient Age:   56 years Exam Location:  [facility] Procedure:      VAS US CAROTID Referring Phys: Alferd Patee [facility] --------------------------------------------------------------------------------  Indications:       CVA. Risk Factors:      Hypertension, hyperlipidemia, Diabetes, prior CVA. Limitations        Today's exam was limited due to a central line. Comparison Study:  no prior Performing Technologist: Archie Patten RVS  Examination Guidelines: A complete evaluation includes B-mode imaging, spectral Doppler, color Doppler, and power Doppler as needed of all accessible portions of each vessel. Bilateral testing is considered an integral part of a complete examination. Limited examinations for reoccurring indications may be performed as noted.  Right Carotid Findings: +----------+--------+--------+--------+------------------+---------------------+           PSV cm/sEDV cm/sStenosisPlaque DescriptionComments              +----------+--------+--------+--------+------------------+---------------------+ CCA Prox                                             not visualized due to                                                     central neck line     +----------+--------+--------+--------+------------------+---------------------+ CCA Distal                                          not visualized due to                                                     central neck line     +----------+--------+--------+--------+------------------+---------------------+ ICA Prox                                            not visualized due to  central neck line     +----------+--------+--------+--------+------------------+---------------------+ ICA Distal                                          not visualized due to                                                     central neck line     +----------+--------+--------+--------+------------------+---------------------+ ECA                                                 not visualized due to                                                     central neck line     +----------+--------+--------+--------+------------------+---------------------+ +---------+--------+--------+---------------------------------------+ VertebralPSV cm/sEDV cm/snot visualized due to central neck line +---------+--------+--------+---------------------------------------+  Left Carotid Findings: +----------+--------+--------+--------+------------------+---------------------+           PSV cm/sEDV cm/sStenosisPlaque DescriptionComments              +----------+--------+--------+--------+------------------+---------------------+ CCA Prox  102     15              homogeneous                             +----------+--------+--------+--------+------------------+---------------------+ CCA Distal132     18              heterogenous                             +----------+--------+--------+--------+------------------+---------------------+ ICA Prox  110     26      1-39%   heterogenous      Velocities may                                                            underestimate degree                                                      of stenosis due to                                                        more proximal  obstruction.          +----------+--------+--------+--------+------------------+---------------------+ ICA Distal115     25                                                      +----------+--------+--------+--------+------------------+---------------------+ ECA       220                                                             +----------+--------+--------+--------+------------------+---------------------+ +----------+--------+--------+--------+-------------------+           PSV cm/sEDV cm/sDescribeArm Pressure (mmHG) +----------+--------+--------+--------+-------------------+ [id number]                                         +----------+--------+--------+--------+-------------------+ +---------+--------+--+--------+--+---------+ VertebralPSV cm/s56EDV cm/s10Antegrade +---------+--------+--+--------+--+---------+   Summary: Right Carotid: Not visualized due to central neck line. Left Carotid: Velocities in the left ICA are consistent with a 1-39% stenosis. Vertebrals: Left vertebral artery demonstrates antegrade flow. *See table(s) above for measurements and observations.  Electronically signed by Rosalin Hawking MD on 08/28/2021 at 1:41:22 PM.    Final    VAS [place] LOWER EXTREMITY VENOUS (DVT)  Result Date: 08/28/2021  Lower Venous DVT Study Patient Name:  SPENSER CONG  Date of Exam:   08/28/2021 Medical Rec #: [id number]            Accession #:    [id number] Date of Birth: 02-Feb-1951            Patient Gender: F  Patient Age:   56 years Exam Location:  [facility] Procedure:      VAS [place] LOWER EXTREMITY VENOUS (DVT) Referring Phys: Cornelius Moras XU --------------------------------------------------------------------------------  Indications: Stroke.  Comparison Study: no prior Performing Technologist: Archie Patten RVS  Examination Guidelines: A complete evaluation includes B-mode imaging, spectral Doppler, color Doppler, and power Doppler as needed of all accessible portions of each vessel. Bilateral testing is considered an integral part of a complete examination. Limited examinations for reoccurring indications may be performed as noted. The reflux portion of the exam is performed with the patient in reverse Trendelenburg.  +---------+---------------+---------+-----------+----------+--------------+ RIGHT    CompressibilityPhasicitySpontaneityPropertiesThrombus Aging +---------+---------------+---------+-----------+----------+--------------+ CFV      Full           Yes      Yes                                 +---------+---------------+---------+-----------+----------+--------------+ SFJ      Full                                                        +---------+---------------+---------+-----------+----------+--------------+ FV Prox  Full                                                        +---------+---------------+---------+-----------+----------+--------------+  FV Mid   Full                                                        +---------+---------------+---------+-----------+----------+--------------+ FV DistalFull                                                        +---------+---------------+---------+-----------+----------+--------------+ PFV      Full                                                        +---------+---------------+---------+-----------+----------+--------------+ POP      Full           Yes      Yes                                  +---------+---------------+---------+-----------+----------+--------------+ PTV      Full                                                        +---------+---------------+---------+-----------+----------+--------------+ PERO     Full                                                        +---------+---------------+---------+-----------+----------+--------------+   +---------+---------------+---------+-----------+----------+--------------+ LEFT     CompressibilityPhasicitySpontaneityPropertiesThrombus Aging +---------+---------------+---------+-----------+----------+--------------+ CFV      Full           Yes      Yes                                 +---------+---------------+---------+-----------+----------+--------------+ SFJ      Full                                                        +---------+---------------+---------+-----------+----------+--------------+ FV Prox  Full                                                        +---------+---------------+---------+-----------+----------+--------------+ FV Mid   Full                                                        +---------+---------------+---------+-----------+----------+--------------+  FV DistalFull                                                        +---------+---------------+---------+-----------+----------+--------------+ PFV      Full                                                        +---------+---------------+---------+-----------+----------+--------------+ POP      Full           Yes      Yes                                 +---------+---------------+---------+-----------+----------+--------------+ PTV      Full                                                        +---------+---------------+---------+-----------+----------+--------------+ PERO     Full                                                         +---------+---------------+---------+-----------+----------+--------------+     Summary: BILATERAL: - No evidence of deep vein thrombosis seen in the lower extremities, bilaterally. -No evidence of popliteal cyst, bilaterally.   *See table(s) above for measurements and observations. Electronically signed by Orlie Pollen on 08/28/2021 at 2:28:27 PM.    Final     Labs: BNP (last 3 results) Recent Labs    08/19/21 0728  BNP 938.1*   Basic Metabolic Panel: Recent Labs  Lab 09/02/21 0254 09/03/21 0304 09/04/21 0300 09/05/21 0257 09/06/21 0153  NA 131* 135 134* 134* 134*  K 5.3* 5.1 5.3* 5.4* 5.4*  CL 100 106 105 103 105  CO2 22 19* 19* 20* 18*  GLUCOSE 216* 127* 194* 183* 162*  BUN 77* 73* 70* 68* 66*  CREATININE 4.51* 4.12* 3.85* 3.79* 3.60*  CALCIUM 9.0 8.9 8.8* 8.9 8.7*  PHOS 6.2* 6.0* 5.1* 5.3* 4.9*   Liver Function Tests: Recent Labs  Lab 09/02/21 0254 09/03/21 0304 09/04/21 0300 09/05/21 0257 09/06/21 0153  ALBUMIN 2.5* 2.6* 2.4* 2.7* 2.5*   No results for input(s): LIPASE, AMYLASE in the last 168 hours. No results for input(s): AMMONIA in the last 168 hours. CBC: Recent Labs  Lab 09/05/21 0257 09/06/21 0638  WBC 5.3 5.2  HGB 7.3* 8.2*  HCT 23.8* 26.7*  MCV 97.9 97.1  PLT 246 222   Cardiac Enzymes: No results for input(s): CKTOTAL, CKMB, CKMBINDEX, TROPONINI in the last 168 hours. BNP: Invalid input(s): POCBNP CBG: Recent Labs  Lab 09/05/21 1142 09/05/21 1610 09/05/21 2048 09/06/21 0638 09/06/21 1214  GLUCAP 98 226* 153* 127* 137*   D-Dimer No results for input(s): DDIMER in the last 72 hours. Hgb A1c No results for input(s): HGBA1C in the last 72 hours. Lipid  Profile No results for input(s): CHOL, HDL, LDLCALC, TRIG, CHOLHDL, LDLDIRECT in the last 72 hours. Thyroid function studies No results for input(s): TSH, T4TOTAL, T3FREE, THYROIDAB in the last 72 hours.  Invalid input(s): FREET3 Anemia work up Recent Labs    09/05/21 0757   FERRITIN 517*  TIBC 223*  IRON 69   Urinalysis    Component Value Date/Time   COLORURINE YELLOW 08/29/2021 1030   APPEARANCEUR HAZY (A) 08/29/2021 1030   LABSPEC 1.013 08/29/2021 1030   PHURINE 5.0 08/29/2021 1030   GLUCOSEU NEGATIVE 08/29/2021 1030   GLUCOSEU NEGATIVE 03/28/2018 1224   HGBUR SMALL (A) 08/29/2021 1030   BILIRUBINUR NEGATIVE 08/29/2021 1030   KETONESUR NEGATIVE 08/29/2021 1030   PROTEINUR 100 (A) 08/29/2021 1030   UROBILINOGEN 1.0 03/28/2018 1224   NITRITE NEGATIVE 08/29/2021 1030   LEUKOCYTESUR NEGATIVE 08/29/2021 1030   Sepsis Labs Invalid input(s): PROCALCITONIN,  WBC,  LACTICIDVEN Microbiology No results found for this or any previous visit (from the past 240 hour(s)).   Time coordinating discharge: 35 minutes  SIGNED: Antonieta Pert, MD  [facility] 09/06/2021, 1:12 PM  If 7PM-7AM, please contact night-coverage [email or web address]

## 2021-09-06 NOTE — Progress Notes (Signed)
Nutrition Education Note  Received MD Consult for education on a low potassium diet.  Patient is being discharged home today.  Reviewed list of high potassium foods to avoid/limit.  Patient did not seem very interested in talking about her diet. Suspect compliance will be poor. Left handout at her bedside.  Lucas Mallow, RD, LDN, CNSC Please refer to [facility] for contact information.

## 2021-09-07 NOTE — TOC Transition Note (Signed)
Transition of Care (TOC) - CM/SW Discharge Note Marvetta Gibbons RN, BSN Transitions of Care Unit 4E- RN Case Manager See Treatment Team for direct phone #    Patient Details  Name: Heather [name] MRN: [id number] Date of Birth: [birth year]  Transition of Care [facility]) CM/SW Contact:  Dawayne Channell, RN Phone Number: 09/07/2021, 6:39 PM   Clinical Narrative:    Pt stable for transition home today, Orders have been placed for HHPT/OT- attempted to reach pt in room via TC, no answer. Unable to reach pt prior to discharge.   09/07/21 1000- attempted to reach pt at home post discharge- no answer- secure VM msg left requesting return call to this writer regarding [place] needs. Awaiting return call.   09/07/21- 1230-no return call yet from pt- call made to pt's spouse- per spouse pt is sleeping still, explained purpose of the call and spouse request return call later today after 3pm.   09/07/21 1600- return call made again to pt's spouse regarding HH- per conversation with spouse- pt reports that pt is no interested at this time in [facility] services- [facility] referral is being declined at this time. (Per spouse pt is resting again at this time and unable to speak to pt herself)- explained to spouse should pt change her mind to reach out to her PCP for new orders and referral- spouse voiced understanding.    Final next level of care: [place] Barriers to Discharge: Barriers Resolved   Patient Goals and CMS Choice Patient states their goals for this hospitalization and ongoing recovery are:: hopeful for return home CMS Medicare.gov Compare Post Acute Care list provided to:: Patient Choice offered to / list presented to : Spouse  Discharge Placement                 Home      Discharge Plan and Services In-house Referral: Clinical Social Work Discharge Planning Services: CM Consult Post Acute Care Choice: Home Health          DME Arranged: N/A         HH Arranged: PT,  OT, Patient Refused HH          Social Determinants of Health (SDOH) Interventions     Readmission Risk Interventions Readmission Risk Prevention Plan 08/21/2021 01/09/2021  Transportation Screening Complete Complete  PCP or Specialist Appt within 3-5 Days - Complete  HRI or [place] - Complete  Social Work Consult for [place] Planning/Counseling - Complete  Palliative Care Screening - Not Applicable  Medication Review Press photographer) Complete Complete  HRI or Home Care Consult Complete -  SW Recovery Care/Counseling Consult Complete -  Palliative Care Screening Not Applicable -  [place] Not Applicable -  Some recent data might be hidden

## 2021-10-05 ENCOUNTER — Encounter (HOSPITAL_COMMUNITY)
Admission: RE | Admit: 2021-10-05 | Discharge: 2021-10-05 | Disposition: A | Discharge status: Home / Self Care | Payer: 59 | Source: Ambulatory Visit | Attending: Nephrology | Admitting: Nephrology

## 2021-10-12 ENCOUNTER — Ambulatory Visit: Payer: 59 | Admitting: Internal Medicine

## 2021-10-13 ENCOUNTER — Telehealth: Payer: Self-pay | Admitting: Nurse Practitioner

## 2021-10-13 NOTE — Telephone Encounter (Signed)
[facility] called and would like her office notes sent to them after her visit in February. The fax number is [phone]

## 2021-11-08 ENCOUNTER — Ambulatory Visit: Payer: 59 | Admitting: Internal Medicine

## 2021-11-25 ENCOUNTER — Emergency Department (HOSPITAL_COMMUNITY): Payer: 59

## 2021-11-25 ENCOUNTER — Other Ambulatory Visit: Payer: Self-pay

## 2021-11-25 ENCOUNTER — Emergency Department (HOSPITAL_COMMUNITY)
Admission: EM | Admit: 2021-11-25 | Discharge: 2021-11-25 | Disposition: A | Discharge status: Home / Self Care | Payer: 59 | Source: Home / Self Care | Attending: Emergency Medicine | Admitting: Emergency Medicine

## 2021-11-25 DIAGNOSIS — W01198A Fall on same level from slipping, tripping and stumbling with subsequent striking against other object, initial encounter: Secondary | ICD-10-CM | POA: Insufficient documentation

## 2021-11-25 DIAGNOSIS — Z79899 Other long term (current) drug therapy: Secondary | ICD-10-CM | POA: Insufficient documentation

## 2021-11-25 DIAGNOSIS — R944 Abnormal results of kidney function studies: Secondary | ICD-10-CM | POA: Insufficient documentation

## 2021-11-25 DIAGNOSIS — A419 Sepsis, unspecified organism: Secondary | ICD-10-CM | POA: Diagnosis not present

## 2021-11-25 DIAGNOSIS — S42291A Other displaced fracture of upper end of right humerus, initial encounter for closed fracture: Secondary | ICD-10-CM | POA: Insufficient documentation

## 2021-11-25 DIAGNOSIS — S4991XA Unspecified injury of right shoulder and upper arm, initial encounter: Secondary | ICD-10-CM

## 2021-11-25 DIAGNOSIS — R000 Tachycardia, unspecified: Secondary | ICD-10-CM | POA: Insufficient documentation

## 2021-11-25 LAB — CBC WITH DIFFERENTIAL/PLATELET
Abs Immature Granulocytes: 0.03 10*3/uL (ref 0.00–0.07)
Basophils Absolute: 0.1 10*3/uL (ref 0.0–0.1)
Basophils Relative: 1 %
Eosinophils Absolute: 0.1 10*3/uL (ref 0.0–0.5)
Eosinophils Relative: 1 %
HCT: 24.8 % — ABNORMAL LOW (ref 36.0–46.0)
Hemoglobin: 7.9 g/dL — ABNORMAL LOW (ref 12.0–15.0)
Immature Granulocytes: 0 %
Lymphocytes Relative: 11 %
Lymphs Abs: 1.1 10*3/uL (ref 0.7–4.0)
MCH: 31.2 pg (ref 26.0–34.0)
MCHC: 31.9 g/dL (ref 30.0–36.0)
MCV: 98 fL (ref 80.0–100.0)
Monocytes Absolute: 0.3 10*3/uL (ref 0.1–1.0)
Monocytes Relative: 3 %
Neutro Abs: 8.5 10*3/uL — ABNORMAL HIGH (ref 1.7–7.7)
Neutrophils Relative %: 84 %
Platelets: 268 10*3/uL (ref 150–400)
RBC: 2.53 MIL/uL — ABNORMAL LOW (ref 3.87–5.11)
RDW: 15.3 % (ref 11.5–15.5)
WBC: 10.1 10*3/uL (ref 4.0–10.5)
nRBC: 0 % (ref 0.0–0.2)

## 2021-11-25 LAB — BASIC METABOLIC PANEL
Anion gap: 11 (ref 5–15)
BUN: 42 mg/dL — ABNORMAL HIGH (ref 8–23)
CO2: 19 mmol/L — ABNORMAL LOW (ref 22–32)
Calcium: 8.3 mg/dL — ABNORMAL LOW (ref 8.9–10.3)
Chloride: 107 mmol/L (ref 98–111)
Creatinine, Ser: 2.82 mg/dL — ABNORMAL HIGH (ref 0.44–1.00)
GFR, Estimated: 18 mL/min — ABNORMAL LOW (ref >60)
Glucose, Bld: 180 mg/dL — ABNORMAL HIGH (ref 70–99)
Potassium: 4 mmol/L (ref 3.5–5.1)
Sodium: 137 mmol/L (ref 135–145)

## 2021-11-25 MED ORDER — OXYCODONE-ACETAMINOPHEN 5-325 MG PO TABS
1.0000 | ORAL_TABLET | Freq: Once | ORAL | Status: AC
  Administered 2021-11-25: 1 via ORAL
  Filled 2021-11-25: qty 1

## 2021-11-25 MED ORDER — FENTANYL CITRATE PF 50 MCG/ML IJ SOSY
50.0000 ug | PREFILLED_SYRINGE | Freq: Once | INTRAMUSCULAR | Status: AC
  Administered 2021-11-25: 50 ug via INTRAMUSCULAR
  Filled 2021-11-25: qty 1

## 2021-11-25 MED ORDER — OXYCODONE-ACETAMINOPHEN 5-325 MG PO TABS
1.0000 | ORAL_TABLET | Freq: Four times a day (QID) | ORAL | 0 refills | Status: DC | PRN
Start: 2021-11-25 — End: 2021-12-01

## 2021-11-25 NOTE — ED Notes (Addendum)
Patient refused ice pack. Patient came to ED with her own sling applied to right arm

## 2021-11-25 NOTE — ED Triage Notes (Signed)
Pt fell last night and reports pain to right upper arm/shoulder. Pt arrived in sling. Right shoulder/arm appears to be lower than left shoulder/arm.

## 2021-11-25 NOTE — ED Provider Notes (Signed)
[facility] EMERGENCY DEPARTMENT Provider Note   CSN: [id number] Arrival date & time: 11/25/21  1126     History  Chief Complaint  Patient presents with   Arm Injury    Right shoulder/upper arm    Heather [name] is a 71 y.o. female who presents to the ED today with complaint of sudden onset, constant, sharp, R shoulder pain s/p mechanical fall that occurred last night.  Patient reports that she lost her footing last night and fell landing onto the right shoulder.  She states that she has been having pain since that time.  She had a shoulder sling from prior rotator cuff surgery which she has been using.  Denies any head injury or loss of consciousness with the fall.  She does endorse that she went home from rehab yesterday prior to falling. No other complaints at this time.   The history is provided by the patient, the spouse and medical records.      Home Medications Prior to Admission medications   Medication Sig Start Date End Date Taking? Authorizing Provider  oxyCODONE-acetaminophen (PERCOCET/ROXICET) 5-325 MG tablet Take 1 tablet by mouth every 6 (six) hours as needed for severe pain. 11/25/21  Yes Kaleigha Chamberlin, PA-C  acetaminophen (TYLENOL) 650 MG CR tablet Take 650 mg by mouth every 8 (eight) hours as needed for pain.    [provider]  Adalimumab (HUMIRA PEN) 40 MG/0.4ML PNKT Inject 40 mg into the skin every 14 (fourteen) days. 08/15/21   Gatha Mayer, MD  albuterol (VENTOLIN HFA) 108 (90 Base) MCG/ACT inhaler Inhale 1-2 puffs into the lungs every 6 (six) hours as needed for shortness of breath or wheezing. 02/16/21 02/16/22  [provider]  allopurinol (ZYLOPRIM) 100 MG tablet Take 100 mg by mouth daily. 03/20/21   [provider]  Ascorbic Acid (VITAMIN C WITH ROSE HIPS) 500 MG tablet Take 500 mg by mouth every morning.    [provider]  atorvastatin (LIPITOR) 80 MG tablet Take 80 mg by mouth every morning.    [provider]  Cholecalciferol (VITAMIN D) 2000 units CAPS Take 2,000 Units by mouth every morning.     [provider]  citalopram (CELEXA) 20 MG tablet Take 1 tablet (20 mg total) by mouth daily. 01/12/21   Barton Dubois, MD  cyanocobalamin (,VITAMIN B-12,) 1000 MCG/ML injection Inject 1,000 mcg into the muscle every 30 (thirty) days.      [provider]  dicyclomine (BENTYL) 20 MG tablet Take 1 tablet (20 mg total) by mouth 3 (three) times daily before meals. Patient taking differently: Take 20 mg by mouth in the morning and at bedtime. 01/12/21   Barton Dubois, MD  diltiazem (CARDIZEM CD) 180 MG 24 hr capsule Take 1 capsule (180 mg total) by mouth daily. 09/06/21 10/06/21  Antonieta Pert, MD  diphenoxylate-atropine (LOMOTIL) 2.5-0.025 MG tablet 1-2 tablets qid prn diarrhea Instead of Imodium Patient not taking: No sig reported 12/06/20   Gatha Mayer, MD  epoetin alfa-epbx (RETACRIT) 3000 UNIT/ML injection Inject 3,000 Units into the skin every 14 (fourteen) days. 11/21/20   Bhutani, Lavina Hamman, MD  esomeprazole (NEXIUM) 40 MG capsule Take 40 mg by mouth daily before breakfast.      [provider]  fenofibrate (TRICOR) 145 MG tablet Take 1 tablet (145 mg total) by mouth daily. 09/07/20   Arnoldo Lenis, MD  HYDROmorphone (DILAUDID) 2 MG tablet Take 0.5 tablets (1 mg total) by mouth every 4 (four)  hours as needed for severe pain. 05/18/21   Isla Pence, MD  insulin detemir (LEVEMIR FLEXTOUCH) 100 UNIT/ML FlexPen Inject 17 Units into the skin at bedtime. 09/06/21   Antonieta Pert, MD  insulin lispro (HUMALOG KWIKPEN) 100 UNIT/ML KwikPen Inject 4 Units into the skin 3 (three) times daily with meals. Sliding scale 09/06/21   Antonieta Pert, MD  labetalol (NORMODYNE) 200 MG tablet Take 1 tablet (200 mg total) by mouth 2 (two) times daily. 09/06/21 10/06/21  Antonieta Pert, MD  lipase/protease/amylase (CREON) 36000 UNITS CPEP capsule Take 1 capsule (36,000 Units total) by mouth with  snacks. Patient taking differently: Take 36,000-72,000 Units by mouth See admin instructions. Takes 2 capsules with all meals and takes one capsule with snacks 01/12/21   Barton Dubois, MD  meclizine (ANTIVERT) 25 MG tablet Take 1 tablet by mouth as needed for dizziness or nausea.    [provider]  omega-3 acid ethyl esters (LOVAZA) 1 g capsule Take 3 capsules by mouth 3 (three) times daily. 10/11/20   [provider]  promethazine (PHENERGAN) 25 MG tablet Take 1 tablet (25 mg total) by mouth every 6 (six) hours as needed for nausea or vomiting. 05/18/21   Isla Pence, MD  sodium polystyrene (KAYEXALATE) 15 GM/60ML suspension Take 30gm (117ml) by mouth once. (on 09/07/2021) 09/07/21   Antonieta Pert, MD  temazepam (RESTORIL) 15 MG capsule Take 15 mg by mouth at bedtime as needed for sleep. 04/14/21   [provider]  venlafaxine XR (EFFEXOR-XR) 150 MG 24 hr capsule Take 150 mg by mouth daily. 04/27/21   [provider]      Allergies    Duloxetine hcl, Fluocinolone, Acetaminophen, Amlodipine besylate, Ciprofloxacin, Hydrocodone-acetaminophen, Metformin, Nsaids, Quinolones, Sulfamethoxazole, Valsartan, Cozaar [losartan], Lisinopril, and Tape    Review of Systems   Review of Systems  Constitutional:  Negative for chills and fever.  Musculoskeletal:  Positive for arthralgias. Negative for back pain and neck pain.  Neurological:  Negative for syncope and headaches.  All other systems reviewed and are negative.  Physical Exam Updated Vital Signs BP (!) 211/80    Pulse (!) 115    Temp 98.1 F (36.7 C) (Oral)    Resp 13    LMP  (LMP Unknown)    SpO2 99%  Physical Exam Vitals and nursing note reviewed.  Constitutional:      Appearance: She is not ill-appearing.  HENT:     Head: Normocephalic and atraumatic.  Eyes:     Conjunctiva/sclera: Conjunctivae normal.  Cardiovascular:     Rate and Rhythm: Normal rate and regular rhythm.  Pulmonary:     Effort:  Pulmonary effort is normal.     Breath sounds: Normal breath sounds. No wheezing, rhonchi or rales.  Abdominal:     Palpations: Abdomen is soft.     Tenderness: There is no abdominal tenderness.  Musculoskeletal:     Cervical back: Neck supple.     Comments: R arm in sling. + diffuse TTP to R shoulder. ROM significantly limited s/2 pain. 2+ radial pulse. Cap refill < 2 seconds to all digits.   Skin:    General: Skin is warm and dry.  Neurological:     Mental Status: She is alert.    ED Results / Procedures / Treatments   Labs (all labs ordered are listed, but only abnormal results are displayed) Labs Reviewed  BASIC METABOLIC PANEL - Abnormal; Notable for the following components:      Result Value   CO2  19 (*)    Glucose, Bld 180 (*)    BUN 42 (*)    Creatinine, Ser 2.82 (*)    Calcium 8.3 (*)    GFR, Estimated 18 (*)    All other components within normal limits  CBC WITH DIFFERENTIAL/PLATELET - Abnormal; Notable for the following components:   RBC 2.53 (*)    Hemoglobin 7.9 (*)    HCT 24.8 (*)    Neutro Abs 8.5 (*)    All other components within normal limits    EKG EKG Interpretation  Date/Time:  Saturday November 25 2021 15:05:42 EST Ventricular Rate:  114 PR Interval:  142 QRS Duration: 80 QT Interval:  345 QTC Calculation: 476 R Axis:   50 Text Interpretation: Sinus tachycardia atrial fibrillation resolved Confirmed by Dorie Rank [phone]) on 11/25/2021 3:10:57 PM  Radiology CT Shoulder Right Wo Contrast  Result Date: 11/25/2021 CLINICAL DATA:  Fall, right proximal humerus fracture EXAM: CT OF THE UPPER RIGHT EXTREMITY WITHOUT CONTRAST TECHNIQUE: Multidetector CT imaging of the upper right extremity was performed according to the standard protocol. RADIATION DOSE REDUCTION: This exam was performed according to the departmental dose-optimization program which includes automated exposure control, adjustment of the mA and/or kV according to patient size and/or use of  iterative reconstruction technique. COMPARISON:  X-ray 11/25/2021, CT chest 08/25/2021 FINDINGS: Bones/Joint/Cartilage Acute comminuted fracture of the right humeral head and neck. Transversely oriented component through the surgical neck which is impacted and mildly anteriorly displaced. Minimally displaced fracture of the greater tuberosity. Minimally depressed fracture involving the anterior aspect of the humeral head articular surface (series 7, images 53-54). There are a few small mildly displaced fracture fragments along the anterior margin of the humeral neck. Inferior pseudosubluxation of the glenohumeral joint secondary to space-occupying hemarthrosis. No dislocation. No additional fractures are seen. Chronic posttraumatic deformity at the coracoclavicular ligament site. Ligaments Suboptimally assessed by CT. Muscles and Tendons Teres minor muscle atrophy. Rotator cuff muscle bulk is otherwise preserved. Rotator cuff tendons are poorly evaluated by CT. Soft tissues Prominent soft tissue swelling with ill-defined hemorrhage at the fracture site. No right axillary lymphadenopathy. IMPRESSION: 1. Acute comminuted fracture of the right humeral head and neck, as described above. 2. Inferior pseudosubluxation of the glenohumeral joint secondary to space-occupying hemarthrosis. No dislocation. Electronically Signed   By: Davina Poke D.O.   On: 11/25/2021 14:09   DG Shoulder Right Port  Result Date: 11/25/2021 CLINICAL DATA:  Trauma, fall EXAM: RIGHT SHOULDER - 1 VIEW COMPARISON:  None. FINDINGS: Severely comminuted fracture is seen in the head and neck of proximal right humerus. There is medial displacement of distal major fracture fragment. There is offset in the alignment of humeral head and glenoid suggesting subluxation/dislocation. There is deformity in the right clavicle suggesting old malunited fracture. There is evidence of surgical fusion at cervicothoracic junction. IMPRESSION: Severely  comminuted displaced fracture is seen in the head and neck of right humerus. Possible subluxation/dislocation in the right shoulder. Electronically Signed   By: Elmer Picker M.D.   On: 11/25/2021 12:31    Procedures Procedures    Medications Ordered in ED Medications  fentaNYL (SUBLIMAZE) injection 50 mcg (50 mcg Intramuscular Given 11/25/21 1310)  oxyCODONE-acetaminophen (PERCOCET/ROXICET) 5-325 MG per tablet 1 tablet (1 tablet Oral Given 11/25/21 1440)    ED Course/ Medical Decision Making/ A&P                           Medical Decision  Making 71 year old female who presents to the ED today status post fall that occurred yesterday, mechanical in nature.  Currently complaining of right shoulder pain.  On arrival to the ED today patient is tachycardic and blood pressure is elevated.  Suspect secondary to pain.  She had an x-ray done prior to being seen also a severely comminuted displaced fracture in the head and neck of the right humerus.  There is concern for possible subluxation or dislocation.  Attending physician Dr. Tomi Bamberger evaluated images with myself.  It does appear that the humeral head is in the joint most pacifically on the posterior view of the x-ray.  Plan to touch base with orthopedist for further recommendations at this time.  Ortho recommends CT scan and outpatient follow up.   CT scan as below.  Pt continues to complain of pain despite IM fentanyl. Percocet provided. Her heart rate is elevated still and BP elevated. She did not take her BP meds this AM. Suspect false elevation secondary to pain. Will continue to monitor. Will add on labs and EKG at this time.   Pt reports improvement after percocet. Will discharge with same at this time. Dr. Garen Lah to follow up with patient and should be calling pt next week. Have given her [name]' information to call if she has not heard from them by midweek. Pt in agreement with plan and stable for discharge home.      Problems Addressed: Closed fracture of head of right humerus, initial encounter: acute illness or injury    Details: Confirmed on CT scan. No concern for dislocation CT scan. Pt discharged home with pain meds, sling, and ortho follow up.  Amount and/or Complexity of Data Reviewed Labs: ordered.    Details: CBC with stable hgb at 7.9. Baseline around 8-8.2.  BMP with baseline creatinine 2.82 and BUN 42 Radiology: ordered.    Details: Xray: IMPRESSION:  Severely comminuted displaced fracture is seen in the head and neck  of right humerus. Possible subluxation/dislocation in the right  shoulder.   CT: IMPRESSION:  1. Acute comminuted fracture of the right humeral head and neck, as  described above.  2. Inferior pseudosubluxation of the glenohumeral joint secondary to  space-occupying hemarthrosis. No dislocation. Discussion of management or test interpretation with external provider(s): Discussed case with orthopedist [name] who recommend CT of the shoulder for further evaluation with plans for discharge in arm sling and outpatient follow up for repair.   Risk Prescription drug management.          Final Clinical Impression(s) / ED Diagnoses Final diagnoses:  Right shoulder injury  Closed fracture of head of right humerus, initial encounter    Rx / DC Orders ED Discharge Orders          Ordered    oxyCODONE-acetaminophen (PERCOCET/ROXICET) 5-325 MG tablet  Every 6 hours PRN        11/25/21 1602             Discharge Instructions      Please pick up pain medication and take as prescribed  When you get home take your evening doses of your blood pressure medication  [name] and his team should be contacting you next week to schedule an appointment for further eval. If you have not heard from them by midweek I would recommend calling the office.   Return to the ED for any new/worsening symptoms        Eustaquio Maize, [place] 11/25/21  1603  Dorie Rank, MD 11/26/21 [phone]

## 2021-11-25 NOTE — Discharge Instructions (Signed)
Please pick up pain medication and take as prescribed  When you get home take your evening doses of your blood pressure medication  [name] and his team should be contacting you next week to schedule an appointment for further eval. If you have not heard from them by midweek I would recommend calling the office.   Return to the ED for any new/worsening symptoms

## 2021-11-28 ENCOUNTER — Encounter (HOSPITAL_COMMUNITY): Payer: Self-pay | Admitting: Emergency Medicine

## 2021-11-28 ENCOUNTER — Telehealth: Payer: Self-pay | Admitting: Orthopedic Surgery

## 2021-11-28 ENCOUNTER — Inpatient Hospital Stay (HOSPITAL_COMMUNITY): Payer: 59

## 2021-11-28 ENCOUNTER — Emergency Department (HOSPITAL_COMMUNITY): Payer: 59

## 2021-11-28 ENCOUNTER — Other Ambulatory Visit: Payer: Self-pay

## 2021-11-28 ENCOUNTER — Inpatient Hospital Stay (HOSPITAL_COMMUNITY)
Admission: EM | Admit: 2021-11-28 | Discharge: 2021-12-01 | DRG: 872 | Disposition: A | Discharge status: Skilled Nursing Facility | Payer: 59 | Attending: Family Medicine | Admitting: Family Medicine

## 2021-11-28 DIAGNOSIS — S42211A Unspecified displaced fracture of surgical neck of right humerus, initial encounter for closed fracture: Secondary | ICD-10-CM | POA: Diagnosis present

## 2021-11-28 DIAGNOSIS — N189 Chronic kidney disease, unspecified: Secondary | ICD-10-CM

## 2021-11-28 DIAGNOSIS — Z888 Allergy status to other drugs, medicaments and biological substances status: Secondary | ICD-10-CM

## 2021-11-28 DIAGNOSIS — Z20822 Contact with and (suspected) exposure to covid-19: Secondary | ICD-10-CM | POA: Diagnosis present

## 2021-11-28 DIAGNOSIS — L03119 Cellulitis of unspecified part of limb: Secondary | ICD-10-CM | POA: Diagnosis not present

## 2021-11-28 DIAGNOSIS — N183 Chronic kidney disease, stage 3 unspecified: Secondary | ICD-10-CM | POA: Diagnosis not present

## 2021-11-28 DIAGNOSIS — N184 Chronic kidney disease, stage 4 (severe): Secondary | ICD-10-CM | POA: Diagnosis present

## 2021-11-28 DIAGNOSIS — S61402A Unspecified open wound of left hand, initial encounter: Secondary | ICD-10-CM | POA: Diagnosis not present

## 2021-11-28 DIAGNOSIS — D497 Neoplasm of unspecified behavior of endocrine glands and other parts of nervous system: Secondary | ICD-10-CM | POA: Insufficient documentation

## 2021-11-28 DIAGNOSIS — K50011 Crohn's disease of small intestine with rectal bleeding: Secondary | ICD-10-CM | POA: Diagnosis present

## 2021-11-28 DIAGNOSIS — E1140 Type 2 diabetes mellitus with diabetic neuropathy, unspecified: Secondary | ICD-10-CM | POA: Diagnosis present

## 2021-11-28 DIAGNOSIS — K5000 Crohn's disease of small intestine without complications: Secondary | ICD-10-CM | POA: Diagnosis present

## 2021-11-28 DIAGNOSIS — A419 Sepsis, unspecified organism: Principal | ICD-10-CM | POA: Diagnosis present

## 2021-11-28 DIAGNOSIS — D496 Neoplasm of unspecified behavior of brain: Secondary | ICD-10-CM | POA: Diagnosis present

## 2021-11-28 DIAGNOSIS — E1165 Type 2 diabetes mellitus with hyperglycemia: Secondary | ICD-10-CM | POA: Diagnosis present

## 2021-11-28 DIAGNOSIS — Z87442 Personal history of urinary calculi: Secondary | ICD-10-CM

## 2021-11-28 DIAGNOSIS — I10 Essential (primary) hypertension: Secondary | ICD-10-CM | POA: Diagnosis not present

## 2021-11-28 DIAGNOSIS — Z885 Allergy status to narcotic agent status: Secondary | ICD-10-CM

## 2021-11-28 DIAGNOSIS — M659 Synovitis and tenosynovitis, unspecified: Secondary | ICD-10-CM | POA: Diagnosis present

## 2021-11-28 DIAGNOSIS — Z886 Allergy status to analgesic agent status: Secondary | ICD-10-CM | POA: Diagnosis not present

## 2021-11-28 DIAGNOSIS — Z8673 Personal history of transient ischemic attack (TIA), and cerebral infarction without residual deficits: Secondary | ICD-10-CM

## 2021-11-28 DIAGNOSIS — Z981 Arthrodesis status: Secondary | ICD-10-CM

## 2021-11-28 DIAGNOSIS — Z882 Allergy status to sulfonamides status: Secondary | ICD-10-CM

## 2021-11-28 DIAGNOSIS — I129 Hypertensive chronic kidney disease with stage 1 through stage 4 chronic kidney disease, or unspecified chronic kidney disease: Secondary | ICD-10-CM | POA: Diagnosis present

## 2021-11-28 DIAGNOSIS — R296 Repeated falls: Secondary | ICD-10-CM | POA: Diagnosis present

## 2021-11-28 DIAGNOSIS — Z91048 Other nonmedicinal substance allergy status: Secondary | ICD-10-CM

## 2021-11-28 DIAGNOSIS — R569 Unspecified convulsions: Secondary | ICD-10-CM | POA: Diagnosis present

## 2021-11-28 DIAGNOSIS — D649 Anemia, unspecified: Secondary | ICD-10-CM | POA: Diagnosis present

## 2021-11-28 DIAGNOSIS — E782 Mixed hyperlipidemia: Secondary | ICD-10-CM | POA: Diagnosis present

## 2021-11-28 DIAGNOSIS — K50018 Crohn's disease of small intestine with other complication: Secondary | ICD-10-CM | POA: Diagnosis present

## 2021-11-28 DIAGNOSIS — Z79899 Other long term (current) drug therapy: Secondary | ICD-10-CM

## 2021-11-28 DIAGNOSIS — I2510 Atherosclerotic heart disease of native coronary artery without angina pectoris: Secondary | ICD-10-CM | POA: Diagnosis present

## 2021-11-28 DIAGNOSIS — Z794 Long term (current) use of insulin: Secondary | ICD-10-CM

## 2021-11-28 DIAGNOSIS — S43004A Unspecified dislocation of right shoulder joint, initial encounter: Secondary | ICD-10-CM | POA: Diagnosis present

## 2021-11-28 DIAGNOSIS — Z9181 History of falling: Secondary | ICD-10-CM | POA: Insufficient documentation

## 2021-11-28 DIAGNOSIS — W19XXXA Unspecified fall, initial encounter: Secondary | ICD-10-CM | POA: Diagnosis present

## 2021-11-28 DIAGNOSIS — Z96651 Presence of right artificial knee joint: Secondary | ICD-10-CM | POA: Diagnosis present

## 2021-11-28 DIAGNOSIS — M1611 Unilateral primary osteoarthritis, right hip: Secondary | ICD-10-CM | POA: Diagnosis present

## 2021-11-28 DIAGNOSIS — E1122 Type 2 diabetes mellitus with diabetic chronic kidney disease: Secondary | ICD-10-CM | POA: Diagnosis present

## 2021-11-28 DIAGNOSIS — L03114 Cellulitis of left upper limb: Secondary | ICD-10-CM | POA: Diagnosis present

## 2021-11-28 DIAGNOSIS — K922 Gastrointestinal hemorrhage, unspecified: Secondary | ICD-10-CM

## 2021-11-28 DIAGNOSIS — R234 Changes in skin texture: Secondary | ICD-10-CM | POA: Diagnosis present

## 2021-11-28 DIAGNOSIS — Z833 Family history of diabetes mellitus: Secondary | ICD-10-CM

## 2021-11-28 DIAGNOSIS — F419 Anxiety disorder, unspecified: Secondary | ICD-10-CM | POA: Diagnosis present

## 2021-11-28 DIAGNOSIS — N179 Acute kidney failure, unspecified: Secondary | ICD-10-CM

## 2021-11-28 DIAGNOSIS — I6330 Cerebral infarction due to thrombosis of unspecified cerebral artery: Secondary | ICD-10-CM

## 2021-11-28 DIAGNOSIS — F32A Depression, unspecified: Secondary | ICD-10-CM | POA: Diagnosis present

## 2021-11-28 DIAGNOSIS — E1121 Type 2 diabetes mellitus with diabetic nephropathy: Secondary | ICD-10-CM | POA: Diagnosis present

## 2021-11-28 DIAGNOSIS — R6520 Severe sepsis without septic shock: Secondary | ICD-10-CM | POA: Diagnosis present

## 2021-11-28 DIAGNOSIS — R413 Other amnesia: Secondary | ICD-10-CM | POA: Diagnosis present

## 2021-11-28 DIAGNOSIS — E538 Deficiency of other specified B group vitamins: Secondary | ICD-10-CM | POA: Diagnosis present

## 2021-11-28 DIAGNOSIS — M109 Gout, unspecified: Secondary | ICD-10-CM | POA: Diagnosis present

## 2021-11-28 DIAGNOSIS — S42291A Other displaced fracture of upper end of right humerus, initial encounter for closed fracture: Secondary | ICD-10-CM | POA: Diagnosis not present

## 2021-11-28 DIAGNOSIS — S42209A Unspecified fracture of upper end of unspecified humerus, initial encounter for closed fracture: Secondary | ICD-10-CM | POA: Diagnosis present

## 2021-11-28 DIAGNOSIS — Z800 Family history of malignant neoplasm of digestive organs: Secondary | ICD-10-CM

## 2021-11-28 LAB — BASIC METABOLIC PANEL
Anion gap: 11 (ref 5–15)
BUN: 56 mg/dL — ABNORMAL HIGH (ref 8–23)
CO2: 20 mmol/L — ABNORMAL LOW (ref 22–32)
Calcium: 7.9 mg/dL — ABNORMAL LOW (ref 8.9–10.3)
Chloride: 107 mmol/L (ref 98–111)
Creatinine, Ser: 3.87 mg/dL — ABNORMAL HIGH (ref 0.44–1.00)
GFR, Estimated: 12 mL/min — ABNORMAL LOW (ref >60)
Glucose, Bld: 70 mg/dL (ref 70–99)
Potassium: 4 mmol/L (ref 3.5–5.1)
Sodium: 138 mmol/L (ref 135–145)

## 2021-11-28 LAB — FERRITIN: Ferritin: 640 ng/mL — ABNORMAL HIGH (ref 11–307)

## 2021-11-28 LAB — CBC WITH DIFFERENTIAL/PLATELET
Abs Immature Granulocytes: 0.04 10*3/uL (ref 0.00–0.07)
Basophils Absolute: 0.1 10*3/uL (ref 0.0–0.1)
Basophils Relative: 1 %
Eosinophils Absolute: 0.1 10*3/uL (ref 0.0–0.5)
Eosinophils Relative: 1 %
HCT: 20.5 % — ABNORMAL LOW (ref 36.0–46.0)
Hemoglobin: 6.5 g/dL — CL (ref 12.0–15.0)
Immature Granulocytes: 1 %
Lymphocytes Relative: 10 %
Lymphs Abs: 0.8 10*3/uL (ref 0.7–4.0)
MCH: 31.6 pg (ref 26.0–34.0)
MCHC: 31.7 g/dL (ref 30.0–36.0)
MCV: 99.5 fL (ref 80.0–100.0)
Monocytes Absolute: 0.5 10*3/uL (ref 0.1–1.0)
Monocytes Relative: 6 %
Neutro Abs: 7.3 10*3/uL (ref 1.7–7.7)
Neutrophils Relative %: 81 %
Platelets: 225 10*3/uL (ref 150–400)
RBC: 2.06 MIL/uL — ABNORMAL LOW (ref 3.87–5.11)
RDW: 15.5 % (ref 11.5–15.5)
WBC: 8.9 10*3/uL (ref 4.0–10.5)
nRBC: 0 % (ref 0.0–0.2)

## 2021-11-28 LAB — RETICULOCYTES
Immature Retic Fract: 16.9 % — ABNORMAL HIGH (ref 2.3–15.9)
RBC.: 2.03 MIL/uL — ABNORMAL LOW (ref 3.87–5.11)
Retic Count, Absolute: 38 10*3/uL (ref 19.0–186.0)
Retic Ct Pct: 1.9 % (ref 0.4–3.1)

## 2021-11-28 LAB — VITAMIN B12: Vitamin B-12: 997 pg/mL — ABNORMAL HIGH (ref 180–914)

## 2021-11-28 LAB — URINALYSIS, MICROSCOPIC (REFLEX)

## 2021-11-28 LAB — URINALYSIS, ROUTINE W REFLEX MICROSCOPIC
Bilirubin Urine: NEGATIVE
Glucose, UA: NEGATIVE mg/dL
Hgb urine dipstick: NEGATIVE
Ketones, ur: NEGATIVE mg/dL
Leukocytes,Ua: NEGATIVE
Nitrite: NEGATIVE
Protein, ur: 30 mg/dL — AB
Specific Gravity, Urine: 1.02 (ref 1.005–1.030)
pH: 5.5 (ref 5.0–8.0)

## 2021-11-28 LAB — IRON AND TIBC
Iron: 17 ug/dL — ABNORMAL LOW (ref 28–170)
Saturation Ratios: 8 % — ABNORMAL LOW (ref 10.4–31.8)
TIBC: 211 ug/dL — ABNORMAL LOW (ref 250–450)
UIBC: 194 ug/dL

## 2021-11-28 LAB — PREPARE RBC (CROSSMATCH)

## 2021-11-28 LAB — LACTIC ACID, PLASMA: Lactic Acid, Venous: 1 mmol/L (ref 0.5–1.9)

## 2021-11-28 LAB — FOLATE: Folate: 8.2 ng/mL (ref >5.9)

## 2021-11-28 LAB — RESP PANEL BY RT-PCR (FLU A&B, COVID) ARPGX2
Influenza A by PCR: NEGATIVE
Influenza B by PCR: NEGATIVE
SARS Coronavirus 2 by RT PCR: NEGATIVE

## 2021-11-28 LAB — GLUCOSE, CAPILLARY: Glucose-Capillary: 102 mg/dL — ABNORMAL HIGH (ref 70–99)

## 2021-11-28 LAB — POC OCCULT BLOOD, ED: Occult Blood, Stool #1: POSITIVE

## 2021-11-28 MED ORDER — ACETAMINOPHEN 500 MG PO TABS
ORAL_TABLET | ORAL | Status: AC
  Filled 2021-11-28: qty 2

## 2021-11-28 MED ORDER — ONDANSETRON HCL 4 MG PO TABS
4.0000 mg | ORAL_TABLET | Freq: Four times a day (QID) | ORAL | Status: DC | PRN
  Administered 2021-12-01: 4 mg via ORAL
  Filled 2021-11-28: qty 1

## 2021-11-28 MED ORDER — HYDROMORPHONE HCL 1 MG/ML IJ SOLN
0.5000 mg | INTRAMUSCULAR | Status: DC | PRN
  Administered 2021-11-28 – 2021-11-29 (×6): 0.5 mg via INTRAVENOUS
  Filled 2021-11-28 (×7): qty 0.5

## 2021-11-28 MED ORDER — LACTATED RINGERS IV SOLN: INTRAVENOUS | Status: DC

## 2021-11-28 MED ORDER — LABETALOL HCL 5 MG/ML IV SOLN: 10.0000 mg | INTRAVENOUS | Status: DC | PRN

## 2021-11-28 MED ORDER — METOPROLOL TARTRATE 50 MG PO TABS
100.0000 mg | ORAL_TABLET | Freq: Two times a day (BID) | ORAL | Status: DC
  Administered 2021-11-28 – 2021-11-29 (×2): 100 mg via ORAL
  Filled 2021-11-28 (×2): qty 2

## 2021-11-28 MED ORDER — LACTATED RINGERS IV BOLUS (SEPSIS)
500.0000 mL | Freq: Once | INTRAVENOUS | Status: AC
  Administered 2021-11-28: 500 mL via INTRAVENOUS

## 2021-11-28 MED ORDER — HYDROMORPHONE HCL 1 MG/ML IJ SOLN
0.5000 mg | Freq: Once | INTRAMUSCULAR | Status: AC
  Administered 2021-11-28: 0.5 mg via INTRAVENOUS
  Filled 2021-11-28: qty 1

## 2021-11-28 MED ORDER — DICYCLOMINE HCL 20 MG PO TABS
20.0000 mg | ORAL_TABLET | Freq: Two times a day (BID) | ORAL | Status: DC
  Administered 2021-11-28: 20 mg via ORAL
  Filled 2021-11-28 (×6): qty 1

## 2021-11-28 MED ORDER — VANCOMYCIN VARIABLE DOSE PER UNSTABLE RENAL FUNCTION (PHARMACIST DOSING): Status: DC

## 2021-11-28 MED ORDER — INSULIN ASPART 100 UNIT/ML IJ SOLN
0.0000 [IU] | Freq: Three times a day (TID) | INTRAMUSCULAR | Status: DC
  Administered 2021-11-29 (×2): 1 [IU] via SUBCUTANEOUS
  Administered 2021-11-29 – 2021-11-30 (×2): 2 [IU] via SUBCUTANEOUS
  Administered 2021-11-30 (×2): 1 [IU] via SUBCUTANEOUS
  Administered 2021-12-01 (×2): 2 [IU] via SUBCUTANEOUS

## 2021-11-28 MED ORDER — HYDRALAZINE HCL 25 MG PO TABS
50.0000 mg | ORAL_TABLET | Freq: Two times a day (BID) | ORAL | Status: DC
  Administered 2021-11-28 – 2021-11-29 (×2): 50 mg via ORAL
  Filled 2021-11-28 (×3): qty 2

## 2021-11-28 MED ORDER — SODIUM CHLORIDE 0.9 % IV SOLN: 10.0000 mL/h | Freq: Once | INTRAVENOUS | Status: DC

## 2021-11-28 MED ORDER — SODIUM BICARBONATE 650 MG PO TABS
650.0000 mg | ORAL_TABLET | Freq: Four times a day (QID) | ORAL | Status: DC
  Administered 2021-11-28 – 2021-11-29 (×2): 650 mg via ORAL
  Filled 2021-11-28 (×3): qty 1

## 2021-11-28 MED ORDER — HEPARIN SODIUM (PORCINE) 5000 UNIT/ML IJ SOLN
5000.0000 [IU] | Freq: Three times a day (TID) | INTRAMUSCULAR | Status: DC
  Administered 2021-11-28 – 2021-12-01 (×8): 5000 [IU] via SUBCUTANEOUS
  Filled 2021-11-28 (×8): qty 1

## 2021-11-28 MED ORDER — INSULIN ASPART 100 UNIT/ML IJ SOLN
0.0000 [IU] | Freq: Every day | INTRAMUSCULAR | Status: DC
  Administered 2021-11-30: 2 [IU] via SUBCUTANEOUS

## 2021-11-28 MED ORDER — ONDANSETRON HCL 4 MG/2ML IJ SOLN
4.0000 mg | Freq: Four times a day (QID) | INTRAMUSCULAR | Status: DC | PRN
  Administered 2021-11-29 – 2021-11-30 (×3): 4 mg via INTRAVENOUS
  Filled 2021-11-28 (×4): qty 2

## 2021-11-28 MED ORDER — LACTATED RINGERS IV BOLUS (SEPSIS)
1000.0000 mL | Freq: Once | INTRAVENOUS | Status: AC
  Administered 2021-11-28: 1000 mL via INTRAVENOUS

## 2021-11-28 MED ORDER — TEMAZEPAM 15 MG PO CAPS: 15.0000 mg | ORAL_CAPSULE | Freq: Every evening | ORAL | Status: DC | PRN

## 2021-11-28 MED ORDER — VANCOMYCIN HCL IN DEXTROSE 1-5 GM/200ML-% IV SOLN
1000.0000 mg | Freq: Once | INTRAVENOUS | Status: AC
  Administered 2021-11-28: 1000 mg via INTRAVENOUS
  Filled 2021-11-28: qty 200

## 2021-11-28 MED ORDER — LORAZEPAM 2 MG/ML IJ SOLN
2.0000 mg | Freq: Once | INTRAMUSCULAR | Status: AC
  Administered 2021-11-28: 2 mg via INTRAVENOUS
  Filled 2021-11-28: qty 1

## 2021-11-28 MED ORDER — SODIUM CHLORIDE 0.9 % IV SOLN
2.0000 g | Freq: Once | INTRAVENOUS | Status: AC
  Administered 2021-11-28: 2 g via INTRAVENOUS
  Filled 2021-11-28: qty 20

## 2021-11-28 MED ORDER — FENTANYL CITRATE PF 50 MCG/ML IJ SOSY
50.0000 ug | PREFILLED_SYRINGE | Freq: Once | INTRAMUSCULAR | Status: AC
Start: 2021-11-28 — End: 2021-11-28
  Administered 2021-11-28: 50 ug via INTRAVENOUS
  Filled 2021-11-28: qty 1

## 2021-11-28 MED ORDER — ACETAMINOPHEN 650 MG RE SUPP: 650.0000 mg | Freq: Four times a day (QID) | RECTAL | Status: DC | PRN

## 2021-11-28 MED ORDER — ACETAMINOPHEN 500 MG PO TABS
1000.0000 mg | ORAL_TABLET | Freq: Once | ORAL | Status: AC
  Administered 2021-11-28: 1000 mg via ORAL
  Filled 2021-11-28: qty 2

## 2021-11-28 MED ORDER — ACETAMINOPHEN 325 MG PO TABS: 650.0000 mg | ORAL_TABLET | Freq: Four times a day (QID) | ORAL | Status: DC | PRN

## 2021-11-28 MED ORDER — SODIUM CHLORIDE 0.9 % IV SOLN
2.0000 g | INTRAVENOUS | Status: DC
  Administered 2021-11-29 – 2021-11-30 (×2): 2 g via INTRAVENOUS
  Filled 2021-11-28 (×2): qty 20

## 2021-11-28 MED ORDER — DIAZEPAM 5 MG PO TABS
5.0000 mg | ORAL_TABLET | Freq: Once | ORAL | Status: AC
  Administered 2021-11-28: 5 mg via ORAL
  Filled 2021-11-28: qty 1

## 2021-11-28 MED ORDER — ALLOPURINOL 100 MG PO TABS
100.0000 mg | ORAL_TABLET | Freq: Every day | ORAL | Status: DC
  Administered 2021-11-30 – 2021-12-01 (×2): 100 mg via ORAL
  Filled 2021-11-28 (×3): qty 1

## 2021-11-28 MED ORDER — POLYETHYLENE GLYCOL 3350 17 G PO PACK: 17.0000 g | PACK | Freq: Every day | ORAL | Status: DC | PRN

## 2021-11-28 NOTE — ED Notes (Signed)
Purewik placed @1633 

## 2021-11-28 NOTE — Telephone Encounter (Signed)
I called patient to advise her of her appt tomorrow.  I spoke with her daughter-in-law and she states [name] fell again Sunday and her hip doesn't look right, she has a nurse coming to access her issues.   I spoke with [name] and yes if they fill like she needs to go to the ER by all means.  I called Levada Dy back and she said the nurse states she needs to go to the ER and they are taking her.    Levada Dy will call back  with an update once she knows more.  She does not know which hospital she will be going to.

## 2021-11-28 NOTE — Sepsis Progress Note (Signed)
Code sepsis protocol being monitored by [employer]. 

## 2021-11-28 NOTE — H&P (Signed)
History and Physical    ANTOINE FIALLOS FVO:[id number] DOB: 11-13-1950 DOA: 11/28/2021  PCP: [name], MD   Patient coming from: Home  I have personally briefly reviewed patient's old medical records in [place]  Chief Complaint: Falls  HPI: Heather [name] is a 71 y.o. female with medical history significant for diabetes mellitus, hypertension, CKD 3, memory problems. Patient brought to the ED with reports of multiple falls.  Friday 1/28, patient was diagnosed with comminuted fracture of the right humeral head and right shoulder dislocation.  2 days ago "Sunday, patient fell again hitting her head and neck.  Yesterday Monday, patient fell on her right hip.  Patient was brought to the ED reports that her right hip did not look normal.  Family is also worried that patient's memory and gait is getting worse,. Family also reported patient's IV infiltrated during her last hospitalization, she was prescribed bacitracin ointment and Keflex for the infection.  The area now has an ulcer and is looking worse with current redness.  Patient was seen in the ED, 1/28, she was to follow-up with Dr. Caines for evaluation of her shoulder fracture.  ED Course: Febrile to 101.5.  Tachycardic heart rate 116-132.  Respiratory rate 13-26.  Blood pressure systolic 130s to 180s.  O2 sats greater than 96% on room air. WBC 8.9.  Creatinine 3.87.  Lactic acid 1. Shoulder x-ray redemonstrates comminuted and displaced fracture of the right humeral head Female pelvic x-ray without acute abnormality. Right hip CT without acute abnormality, but shows a 6 x 4 x 3 cm hematoma. Head and cervical CT without acute abnormality. IV Vanco and ceftriaxone started.  Review of Systems: As per HPI all other systems reviewed and negative.  Past Medical History:  Diagnosis Date   Acute renal failure superimposed on stage 3 chronic kidney disease (HCC) 01/21/2016   Anemia    Anxiety    B12 deficiency    Brain  tumor (benign) (HCC) 10/29/2020   Cellulitis and abscess    Abdomen and buttocks   CKD (chronic kidney disease) stage 3, GFR 30-59 ml/min (HCC)    Closed fracture of distal end of femur, unspecified fracture morphology, initial encounter (HCC)    Coronary arteriosclerosis 07/15/2018   Crohn disease (HCC)    Depression    Diabetic neuropathy (HCC)    feet   DJD (degenerative joint disease)    Right forminal stenosis C4-5   Essential hypertension    Femur fracture, right (HCC) 03/30/2017   Folliculitis    GERD (gastroesophageal reflux disease)    Gout    Headache(784.0)    Hiatal hernia    History of blood transfusion    History of bronchitis    History of cardiac catheterization    No significant CAD 2012   History of kidney stones    surgery to remove   Insomnia    Irritable bowel syndrome    Mixed hyperlipidemia    Osteoarthritis    Palpitations    Pneumonia    Reflux esophagitis    Salmonella    Stroke (HCC)    Tinnitus    Right   Type 2 diabetes mellitus (HCC)     Past Surgical History:  Procedure Laterality Date   BACK SURGERY     c-spine surgery     06" /2008   CARDIAC CATHETERIZATION     CHOLECYSTECTOMY     COLONOSCOPY  04/21/2011; 05/29/11   6/12: [place] - ?AVM at IC valve, inflammatory changes  at [facility] valve [facility]); 7/12 - [facility]; IC valve erosions, look chronic and probably Crohn's per path   colonoscopy  2017   [facility]: random biopsies normal. TI normal   ESOPHAGOGASTRODUODENOSCOPY  05/29/11   normal   ESOPHAGOGASTRODUODENOSCOPY N/A 01/21/2016   Dr. Michail Sermon: normal   EYE SURGERY Bilateral    cataracts removed   FEMUR IM NAIL Right 04/10/2018   Procedure: INTRAMEDULLARY (IM) NAIL FEMORAL;  Surgeon: Paralee Cancel, MD;  Location: [facility];  Service: Orthopedics;  Laterality: Right;   FRACTURE SURGERY     GIVENS CAPSULE STUDY  11/17/2020   HARDWARE REMOVAL Right 04/10/2018   Procedure: HARDWARE REMOVAL RIGHT DISTAL FEMUR;  Surgeon: Paralee Cancel, MD;  Location: [facility];  Service: Orthopedics;  Laterality: Right;   HERNIA REPAIR     IR FLUORO GUIDE CV LINE RIGHT  04/01/2017   IR US GUIDE VASC ACCESS RIGHT  04/01/2017   JOINT REPLACEMENT Right    hip   OPEN REDUCTION INTERNAL FIXATION (ORIF) DISTAL RADIAL FRACTURE Right 05/28/2019   Procedure: OPEN REDUCTION INTERNAL FIXATION (ORIF) DISTAL RADIAL FRACTURE;  Surgeon: Verner Mould, MD;  Location: [place];  Service: Orthopedics;  Laterality: Right;  with block 90 minutes   ORIF FEMUR FRACTURE Right 03/31/2017   Procedure: OPEN REDUCTION INTERNAL FIXATION (ORIF) DISTAL FEMUR FRACTURE;  Surgeon: Paralee Cancel, MD;  Location: [place];  Service: Orthopedics;  Laterality: Right;   removal of kidney stone     Right knee arthroscopic surgery     TONSILLECTOMY     TOTAL ABDOMINAL HYSTERECTOMY       reports that she has never smoked. She has never used smokeless tobacco. She reports that she does not drink alcohol and does not use drugs.  Allergies  Allergen Reactions   Losartan Swelling, Rash and Nausea And Vomiting   Quinolones Swelling, Nausea Only and Nausea And Vomiting    Swelling of face, jaw, and lips Swelling of face, jaw, and lips   Duloxetine Hcl Swelling   Fluocinolone Swelling   Sulfa Antibiotics Swelling and Nausea And Vomiting    headache Swelling of feet, legs Swelling of feet, legs   Acetaminophen Itching and Swelling    Pt states "some swelling" Pt states "some swelling" Pt states "some swelling" Pt states "some swelling" Pt states "some swelling"   Amlodipine Besylate Swelling    Swelling of feet, fluid retention   Amlodipine Besylate Swelling   Ciprofloxacin Nausea And Vomiting and Swelling    Swelling of face, jaw, and lips   Duloxetine Swelling   Hydrocodone-Acetaminophen Itching   Hydrocodone-Acetaminophen Itching and Swelling   Ketamine Other (See Comments)   Metformin Other (See Comments)    REACTION: GI UPSET Other reaction(s): Stomach    Nsaids Other (See Comments)    "HAS BLEEDING ULCERS"   Sulfamethoxazole Swelling    Swelling of feet, legs   Valsartan Swelling   Lisinopril Swelling and Rash    Oral swelling, and red streaks on arms/stomach   Tape Itching and Rash    Paper tape can only be used on this patient    Family History  Problem Relation Age of Onset   Diabetes Mother    Colon cancer Brother        POSSIBLY. Patient states diagnosed at age 41, then later states age 43. unclear and limited historian   Esophageal cancer Neg Hx    Rectal cancer Neg Hx    Stomach cancer Neg Hx     Prior  to Admission medications   Medication Sig Start Date End Date Taking? Authorizing Provider  acetaminophen (TYLENOL) 500 MG tablet Take 1,000 mg by mouth every 6 (six) hours as needed.   Yes [provider]  acetaminophen (TYLENOL) 650 MG CR tablet Take 650 mg by mouth every 8 (eight) hours as needed for pain.   Yes [provider]  albuterol (VENTOLIN HFA) 108 (90 Base) MCG/ACT inhaler Inhale 1-2 puffs into the lungs every 6 (six) hours as needed for shortness of breath or wheezing. 02/16/21 02/16/22 Yes [provider]  allopurinol (ZYLOPRIM) 100 MG tablet Take 100 mg by mouth daily. 03/20/21  Yes [provider]  Ascorbic Acid (VITAMIN C WITH ROSE HIPS) 500 MG tablet Take 500 mg by mouth every morning.   Yes [provider]  atorvastatin (LIPITOR) 80 MG tablet Take 80 mg by mouth every morning.   Yes [provider]  calcitRIOL (ROCALTROL) 0.25 MCG capsule Take 0.25 mcg by mouth 3 (three) times a week. 10/05/21  Yes [provider]  calcium carbonate (OS-CAL) 1250 (500 Ca) MG chewable tablet Chew by mouth. 10/04/21 10/04/22 Yes [provider]  cephALEXin (KEFLEX) 500 MG capsule Take 500 mg by mouth 3 (three) times daily. 11/24/21  Yes [provider]  Cholecalciferol (VITAMIN D) 2000 units CAPS Take 2,000 Units by mouth every morning.    Yes [provider]  citalopram (CELEXA) 20 MG tablet Take 1 tablet (20 mg total) by mouth daily. 01/12/21  Yes Barton Dubois, MD  cyanocobalamin (,VITAMIN B-12,) 1000 MCG/ML injection Inject 1,000 mcg into the muscle every 30 (thirty) days.     Yes [provider]  diazepam (VALIUM) 5 MG tablet Take 5 mg by mouth every 12 (twelve) hours as needed for anxiety. 11/20/18  Yes [provider]  dicyclomine (BENTYL) 20 MG tablet Take 1 tablet (20 mg total) by mouth 3 (three) times daily before meals. Patient taking differently: Take 20 mg by mouth in the morning and at bedtime. 01/12/21  Yes Barton Dubois, MD  diphenoxylate-atropine (LOMOTIL) 2.5-0.025 MG tablet 1-2 tablets qid prn diarrhea Instead of Imodium 12/06/20  Yes Gatha Mayer, MD  esomeprazole (NEXIUM) 40 MG capsule Take 40 mg by mouth daily before breakfast.     Yes [provider]  fenofibrate (TRICOR) 145 MG tablet Take 1 tablet (145 mg total) by mouth daily. 09/07/20  Yes BranchAlphonse Guild, MD  furosemide (LASIX) 40 MG tablet Take 40 mg by mouth daily. 11/20/18  Yes [provider]  gabapentin (NEURONTIN) 300 MG capsule Take 300 mg by mouth 3 (three) times daily. 11/24/21  Yes [provider]  hydrALAZINE (APRESOLINE) 50 MG tablet Take 50 mg by mouth 2 (two) times daily. 09/14/21  Yes [provider]  indomethacin (INDOCIN) 50 MG capsule Take 50 mg by mouth 2 (two) times daily with a meal. 11/20/18  Yes [provider]  insulin detemir (LEVEMIR FLEXTOUCH) 100 UNIT/ML FlexPen Inject 17 Units into the skin at bedtime. 09/06/21  Yes Antonieta Pert, MD  insulin lispro (HUMALOG KWIKPEN) 100 UNIT/ML KwikPen Inject 4 Units into the skin 3 (three) times daily with meals. Sliding scale 09/06/21  Yes Antonieta Pert, MD  meclizine (ANTIVERT) 25 MG tablet Take 1 tablet by mouth as needed for dizziness or nausea.   Yes [provider]  metoprolol tartrate (LOPRESSOR) 100 MG tablet Take 100 mg by  mouth 2 (two) times daily. 11/20/18  Yes [provider]  ondansetron [facility])  4 MG tablet Take by mouth. 11/20/18  Yes [provider]  oxyCODONE-acetaminophen (PERCOCET/ROXICET) 5-325 MG tablet Take 1 tablet by mouth every 6 (six) hours as needed for severe pain. 11/25/21  Yes Venter, Margaux, PA-C  promethazine (PHENERGAN) 25 MG tablet Take 1 tablet (25 mg total) by mouth every 6 (six) hours as needed for nausea or vomiting. 05/18/21  Yes Isla Pence, MD  sodium bicarbonate 650 MG tablet Take 650 mg by mouth 4 (four) times daily. 10/05/21  Yes [provider]  temazepam (RESTORIL) 15 MG capsule Take 15 mg by mouth at bedtime as needed for sleep. 04/14/21  Yes [provider]  tiZANidine (ZANAFLEX) 4 MG tablet Take 4 mg by mouth 2 (two) times daily as needed for muscle spasms. 11/24/21  Yes [provider]  valsartan (DIOVAN) 80 MG tablet Take 80 mg by mouth daily. 10/26/21 10/26/22 Yes [provider]  venlafaxine XR (EFFEXOR-XR) 150 MG 24 hr capsule Take 150 mg by mouth daily. 04/27/21  Yes [provider]  Adalimumab (HUMIRA PEN) 40 MG/0.4ML PNKT Inject 40 mg into the skin every 14 (fourteen) days. Patient not taking: Reported on 11/28/2021 08/15/21   Gatha Mayer, MD  diltiazem (CARDIZEM CD) 180 MG 24 hr capsule Take 1 capsule (180 mg total) by mouth daily. Patient not taking: Reported on 11/28/2021 09/06/21 10/06/21  Antonieta Pert, MD  epoetin alfa-epbx (RETACRIT) 3000 UNIT/ML injection Inject 3,000 Units into the skin every 14 (fourteen) days. Patient not taking: Reported on 11/28/2021 11/21/20   Liana Gerold, MD  HYDROmorphone (DILAUDID) 2 MG tablet Take 0.5 tablets (1 mg total) by mouth every 4 (four) hours as needed for severe pain. Patient not taking: Reported on 11/28/2021 05/18/21   Isla Pence, MD  insulin glargine-yfgn [facility]) 100 UNIT/ML injection Inject 10 Units into the skin at bedtime. Patient not taking: Reported  on 11/28/2021 11/24/21   [provider]  labetalol (NORMODYNE) 200 MG tablet Take 1 tablet (200 mg total) by mouth 2 (two) times daily. Patient not taking: Reported on 11/28/2021 09/06/21 10/06/21  Antonieta Pert, MD  lipase/protease/amylase (CREON) 36000 UNITS CPEP capsule Take 1 capsule (36,000 Units total) by mouth with snacks. Patient not taking: Reported on 11/28/2021 01/12/21   Barton Dubois, MD  sodium polystyrene (KAYEXALATE) 15 GM/60ML suspension Take 30gm (157ml) by mouth once. (on 09/07/2021) Patient not taking: Reported on 11/28/2021 09/07/21   Antonieta Pert, MD    Physical Exam: Exam limited, patient is status post fentanyl, Dilaudid, Valium and Ativan. Vitals:   11/28/21 1630 11/28/21 1653 11/28/21 1700 11/28/21 1800  BP: (!) 148/71  (!) 144/107   Pulse:   (!) 122 (!) 128  Resp: (!) 26  20 14   Temp:  (!) 101.5 F (38.6 C)    TempSrc:  Rectal    SpO2:   97% 92%  Weight:      Height:        Constitutional:  calm, comfortable Vitals:   11/28/21 1630 11/28/21 1653 11/28/21 1700 11/28/21 1800  BP: (!) 148/71  (!) 144/107   Pulse:   (!) 122 (!) 128  Resp: (!) 26  20 14   Temp:  (!) 101.5 F (38.6 C)    TempSrc:  Rectal    SpO2:   97% 92%  Weight:      Height:       Eyes: PERRL, lids and conjunctivae normal ENMT: Mucous membranes are dry  Neck: normal, supple, no masses, no thyromegaly Respiratory: clear to auscultation  bilaterally, no wheezing, no crackles. Normal respiratory effort. No accessory muscle use.  Cardiovascular: Tachycardic, Regular rate and rhythm, no murmurs / rubs / gallops. No extremity edema. 2+ pedal pulses. Abdomen: no tenderness, no masses palpated. No hepatosplenomegaly. Bowel sounds positive.  Musculoskeletal: no clubbing / cyanosis. No joint deformity upper and lower extremities. Good ROM, no contractures. Normal muscle tone.  Skin: Dime sized ulcer to left hand/wrist area.  Black eschar.  Surrounding erythema, swelling with  tenderness Neurologic: Limited exam due to medications, moving extremities spontaneously, except right upper extremity that is in a sling. Psychiatric: Slightly lethargic, likely from medications given in ED.     Labs on Admission: I have personally reviewed following labs and imaging studies  CBC: Recent Labs  Lab 11/25/21 1500 11/28/21 1437  WBC 10.1 8.9  NEUTROABS 8.5* 7.3  HGB 7.9* 6.5*  HCT 24.8* 20.5*  MCV 98.0 99.5  PLT 268 623   Basic Metabolic Panel: Recent Labs  Lab 11/25/21 1500 11/28/21 1437  NA 137 138  K 4.0 4.0  CL 107 107  CO2 19* 20*  GLUCOSE 180* 70  BUN 42* 56*  CREATININE 2.82* 3.87*  CALCIUM 8.3* 7.9*    Radiological Exams on Admission: DG Pelvis 1-2 Views  Result Date: 11/28/2021 CLINICAL DATA:  Multiple falls. EXAM: PELVIS - 1-2 VIEW COMPARISON:  01/07/2021 FINDINGS: Previous ORIF of the right femur. IM nail and hip screw remains stable in position from previous exam. No signs of acute fracture or dislocation. Signs of previous lumbar pedicle screw and posterior rod fixation. Moderate degenerative changes are noted within both hip joints. IMPRESSION: 1. No acute findings. 2. Status post ORIF of the right femur. Electronically Signed   By: Kerby Moors M.D.   On: 11/28/2021 14:24   DG Shoulder Right  Result Date: 11/28/2021 CLINICAL DATA:  Multiple falls. Right shoulder pain with limited range of motion. EXAM: RIGHT SHOULDER - 2+ VIEW COMPARISON:  Radiographs and CT 11/25/2021. FINDINGS: AP and Y-views are submitted. The comminuted and moderately displaced fracture of the right humeral head and neck has not significantly changed. There is no dislocation. Old fracture of the distal clavicle noted. Patient is status post fusion at the cervicothoracic junction. IMPRESSION: No significant change in comminuted and displaced fracture of the right humeral head and neck over the last 3 days. Electronically Signed   By: Richardean Sale M.D.   On: 11/28/2021  14:25   CT Head Wo Contrast  Result Date: 11/28/2021 CLINICAL DATA:  Head trauma, moderate-severe.  Fell on right side. EXAM: CT HEAD WITHOUT CONTRAST TECHNIQUE: Contiguous axial images were obtained from the base of the skull through the vertex without intravenous contrast. RADIATION DOSE REDUCTION: This exam was performed according to the departmental dose-optimization program which includes automated exposure control, adjustment of the mA and/or kV according to patient size and/or use of iterative reconstruction technique. COMPARISON:  03/29/2021 and MRI 08/27/2021 FINDINGS: Brain: Small low-density areas along the medial basal ganglia bilaterally. Findings likely represent small lacune infarcts and were present on the previous MRI. Negative for acute hemorrhage, mass lesion, midline shift, hydrocephalus or new large infarct. Vascular: No hyperdense vessel or unexpected calcification. Skull: Normal. Negative for fracture or focal lesion. Sinuses/Orbits: Mild mucosal thickening in the ethmoid air cells. Other: None IMPRESSION: 1. No acute intracranial abnormality. 2. Small low-density areas in the bilateral basal ganglia. These are suggestive for remote lacune infarcts. Electronically Signed   By: Markus Daft M.D.   On: 11/28/2021 14:37  CT Cervical Spine Wo Contrast  Result Date: 11/28/2021 CLINICAL DATA:  Neck trauma, dangerous injury mechanism. Fell on right side. EXAM: CT CERVICAL SPINE WITHOUT CONTRAST TECHNIQUE: Multidetector CT imaging of the cervical spine was performed without intravenous contrast. Multiplanar CT image reconstructions were also generated. RADIATION DOSE REDUCTION: This exam was performed according to the departmental dose-optimization program which includes automated exposure control, adjustment of the mA and/or kV according to patient size and/or use of iterative reconstruction technique. COMPARISON:  08/22/2015 FINDINGS: Alignment: Alignment is within normal limits. Skull base  and vertebrae: Negative for an acute fracture or dislocation. Anterior plate and screw fixation with interbody device at C3-C4. Interbody devices at C3-C4, C4-C5, C5-C6 and C6-C7. Anterior plate and screw fixation with interbody device at C7-T1. Bilateral posterior screws and rods at C7-T1. Soft tissues and spinal canal: Thyroid tissue is unremarkable. No significant soft tissue swelling or lymphadenopathy in the neck. Disc levels:  Cervical fusion from C3 through T1. Upper chest: Interstitial thickening at the lung apices. Negative for pneumothorax. Other: None IMPRESSION: 1. No acute bone abnormality in the cervical spine. 2. Extensive postoperative changes with surgical fusion from C3 through T1. Electronically Signed   By: Markus Daft M.D.   On: 11/28/2021 14:51   CT Hip Right Wo Contrast  Result Date: 11/28/2021 CLINICAL DATA:  Status post fall 1 month ago.  Right hip pain. EXAM: CT OF THE RIGHT HIP WITHOUT CONTRAST TECHNIQUE: Multidetector CT imaging of the right hip was performed according to the standard protocol. Multiplanar CT image reconstructions were also generated. RADIATION DOSE REDUCTION: This exam was performed according to the departmental dose-optimization program which includes automated exposure control, adjustment of the mA and/or kV according to patient size and/or use of iterative reconstruction technique. COMPARISON:  None. FINDINGS: Bones/Joint/Cartilage No acute fracture or dislocation. Healed proximal right femoral fracture transfixed with a intramedullary nail and interlocking screw without failure or complication. Mild-moderate osteoarthritis of the right hip. Mild heterotopic calcification adjacent to the right greater trochanter. Normal alignment. No joint effusion. At L4-5 there is a broad-based disc bulge, moderate bilateral facet arthropathy, and mild spinal stenosis. At L5-S1 there is moderate bilateral facet arthropathy. Mild osteoarthritis of the right SI joint. Ligaments  Ligaments are suboptimally evaluated by CT. Muscles and Tendons Muscles are normal.  No muscle atrophy. Soft tissue Abdominal aortic atherosclerosis. Extensive soft tissue contusion in the subcutaneous fat overlying the right hip. 6.3 x 4.6 x 3.5 cm hematoma in the subcutaneous fat overlying the greater trochanter. No soft tissue mass. IMPRESSION: 1.  No acute osseous injury of the right hip. 2. Soft tissue contusion in subcutaneous fat overlying the right hip with a 6.3 x 4.6 x 3.5 cm hematoma. 3. Healed remote right proximal femoral fracture. 4.  Aortic Atherosclerosis (ICD10-I70.0). 5. Moderate osteoarthritis of the right hip. Electronically Signed   By: Kathreen Devoid M.D.   On: 11/28/2021 15:56   DG Femur Min 2 Views Right  Result Date: 11/28/2021 CLINICAL DATA:  Multiple falls.  Right hip pain. EXAM: RIGHT FEMUR 2 VIEWS COMPARISON:  Right hip and knee radiographs 06/15/2018. FINDINGS: Status post proximal right femoral intramedullary rod and dynamic screw fixation of a comminuted intertrochanteric proximal femur fracture. The hardware is intact without loosening. Status post right total knee arthroplasty. The hardware is intact without loosening. No evidence of acute fracture or dislocation. No significant knee joint effusion. The bones are demineralized. There is diffuse femoral atherosclerosis. The soft tissues appear diffusely edematous without unexpected foreign body.  IMPRESSION: No acute osseous findings status post proximal right femoral ORIF and total knee arthroplasty. Possible generalized soft tissue swelling. Electronically Signed   By: Richardean Sale M.D.   On: 11/28/2021 14:28    EKG: Independently reviewed.  Sinus rhythm rate 119.  QTc 462.  No significant change from prior.  Assessment/Plan Principal Problem:   Sepsis ([place]) Active Problems:   Chronic kidney disease, stage III (moderate) (HCC)   Essential hypertension   Type 2 diabetes with nephropathy (HCC)   Depression   Acute  kidney injury superimposed on CKD (HCC)   Crohn's disease of small intestine with other complication ([place])   Cerebral thrombosis with cerebral infarction   Open wound of left hand   Acute on chronic anemia   Severe sepsis-focus of infection likely from left hand wound with surrounding cellulitis, tachycardic 116-132, febrile to 101.5.  Normal lactic acid 1.  But with endorgan dysfunction with AKI on CKD. Was prescribed Keflex as outpatient 1/27. -Obtain portable chest x-ray -Obtain UA and urine culture -Follow-up blood cultures - Continue broad-spectrum antibiotics IV vancomycin and ceftriaxone -2.5 L bolus given, continue L/R 100cc/hr x 15hrs - CT left hand without contrast pending results may need debridement-ortho or gen surg  Acute on chronic kidney injury 4-creatinine 3.87, last check 3 days ago creatinine was 2.8. -Hydrate - Hold home lasix 40mg  daily, valsartan -Resume home bicarbonate  Acute on chronic anemia-hemoglobin 6.5, baseline 7-9. -Transfuse 1 unit PRBC - Anemia panel  Multiple falls, gait problems, memory problems-head CT without acute abnormality.  Family concerned that brain tumor is affecting patient's balance and causing seizures.  Per chart she has a history of small right frontal meningioma, but last MRI 07/2021 due to motion degraded, showed small acute left cerebellar and left frontal operculum infarct infarcts. -May benefit from evaluation prior to discharge  Right shoulder fracture and dislocation-currently immobilized in a sling.  Patient was to follow-up as outpatient with [name]. -Ortho consult in a.m. for right shoulder fracture  Crohn's disease-appears stable.  Supposed to be on Humira. -Resume bentyl  Controlled diabetes mellitus-random glucose 70. Hgba1c 6.1. -Hold home Levemir 17 units nightly - SSI- S  Hypertension-elevated -As needed labetalol 10 mg for systolic greater than 440 -Resume hydralazine, metoprolol -Hold home 40 mg daily  Lasix   DVT prophylaxis: heparin Code Status: Full code Family Communication: None at bedside Disposition Plan: > 2 days Consults called: Ortho Admission status: Inpt, Tele I certify that at the point of admission it is my clinical judgment that the patient will require inpatient hospital care spanning beyond 2 midnights from the point of admission due to high intensity of service, high risk for further deterioration and high frequency of surveillance required.   Bethena Roys MD [facility]  11/28/2021, 6:43 PM

## 2021-11-28 NOTE — Progress Notes (Signed)
Pharmacy Antibiotic Note  Heather [name] is a 71 y.o. female admitted on 11/28/2021 with sepsis.  Pharmacy has been consulted for Vancomycin dosing.  ClCr < 5ml/min.  No h/o dialysis noted.  Plan: Vancomycin 1gm given in ED F/U renal fxn for additional doses and regimen  Height: 5\' 4"  (162.6 cm) Weight: 75 kg (165 lb 5.5 oz) IBW/kg (Calculated) : 54.7  Temp (24hrs), Avg:100.1 F (37.8 C), Min:98.6 F (37 C), Max:101.5 F (38.6 C)  Recent Labs  Lab 11/25/21 1500 11/28/21 1437  WBC 10.1 8.9  CREATININE 2.82* 3.87*    Estimated Creatinine Clearance: 13.6 mL/min (A) (by C-G formula based on SCr of 3.87 mg/dL (H)).    Allergies  Allergen Reactions   Losartan Swelling, Rash and Nausea And Vomiting   Quinolones Swelling, Nausea Only and Nausea And Vomiting    Swelling of face, jaw, and lips Swelling of face, jaw, and lips   Duloxetine Hcl Swelling   Fluocinolone Swelling   Sulfa Antibiotics Swelling and Nausea And Vomiting    headache Swelling of feet, legs Swelling of feet, legs   Acetaminophen Itching and Swelling    Pt states "some swelling" Pt states "some swelling" Pt states "some swelling" Pt states "some swelling" Pt states "some swelling"   Amlodipine Besylate Swelling    Swelling of feet, fluid retention   Amlodipine Besylate Swelling   Ciprofloxacin Nausea And Vomiting and Swelling    Swelling of face, jaw, and lips   Duloxetine Swelling   Hydrocodone-Acetaminophen Itching   Hydrocodone-Acetaminophen Itching and Swelling   Ketamine Other (See Comments)   Metformin Other (See Comments)    REACTION: GI UPSET Other reaction(s): Stomach   Nsaids Other (See Comments)    "HAS BLEEDING ULCERS"   Sulfamethoxazole Swelling    Swelling of feet, legs   Valsartan Swelling   Lisinopril Swelling and Rash    Oral swelling, and red streaks on arms/stomach   Tape Itching and Rash    Paper tape can only be used on this patient    Antimicrobials this  admission: Vancomycin 1/31 >>   Dose adjustments this admission:   Microbiology results:  BCx: pending  UCx: pending   Sputum: pending   MRSA PCR: pending  Thank you for allowing pharmacy to be a part of this patients care.  Hart Robinsons A 11/28/2021 5:13 PM

## 2021-11-28 NOTE — Progress Notes (Signed)
Called patient's husband, Akaylah Lalley, per patient request. Patient unable to answer admission questions. Unable to reach him at this time.

## 2021-11-28 NOTE — ED Provider Notes (Signed)
[facility] EMERGENCY DEPARTMENT Provider Note   CSN: [id number] Arrival date & time: 11/28/21  1255     History  Chief Complaint  Patient presents with   Fall    [facility] dt multiple falls since Sunday, brain tumor effecting balance & causing seizures x 86mos, pt c/o R hip pain, pt states she hit her head on Sunday, scab noted above R eyebrow, no blood thinners     RAHI CHANDONNET is a 71 y.o. female with a past medical history of hypertension, diabetes, CKD IIIb and reported memory loss presenting today after a fall.  Patient's daughter reports that 2 years ago patient was diagnosed with a brain tumor in the back of her head.  They were told that it was an operable and she was started on some medications back then.  In 07/2021 she was having problems with elevated blood pressure and blood sugars and was admitted to the hospital for 2 weeks for further evaluation for a potential stroke.  On Friday 1/28 she was diagnosed with a comminuted fracture of the right humeral head as well as a right shoulder dislocation.  On Sunday, the patient got up without her walker and fell, hitting her head and neck.  She was not seen at that time.  On Monday, patient fell again and landed on her right hip.  Family member went to see her today and said that her hip was in an abnormal position and brought her to the hospital.  Patient complaining of 10 out of 10 pain.  No numbness or tingling.  She is not on any blood thinners.  No changes in her vision but endorsing headache.  Patient and family member also states that while she was in the hospital her IV infiltrated.  She is on bacitracin ointment and Keflex for infection.  They feel as though it is getting worse and the skin around it is  turning red.  Family member also reports that patient's husband is afraid that her memory loss is progressing.  She has never been evaluated for this and has no diagnosis of dementia.   Home Medications Prior to Admission  medications   Medication Sig Start Date End Date Taking? Authorizing Provider  acetaminophen (TYLENOL) 650 MG CR tablet Take 650 mg by mouth every 8 (eight) hours as needed for pain.    [provider]  Adalimumab (HUMIRA PEN) 40 MG/0.4ML PNKT Inject 40 mg into the skin every 14 (fourteen) days. 08/15/21   Gatha Mayer, MD  albuterol (VENTOLIN HFA) 108 (90 Base) MCG/ACT inhaler Inhale 1-2 puffs into the lungs every 6 (six) hours as needed for shortness of breath or wheezing. 02/16/21 02/16/22  [provider]  allopurinol (ZYLOPRIM) 100 MG tablet Take 100 mg by mouth daily. 03/20/21   [provider]  Ascorbic Acid (VITAMIN C WITH ROSE HIPS) 500 MG tablet Take 500 mg by mouth every morning.    [provider]  atorvastatin (LIPITOR) 80 MG tablet Take 80 mg by mouth every morning.    [provider]  Cholecalciferol (VITAMIN D) 2000 units CAPS Take 2,000 Units by mouth every morning.     [provider]  citalopram (CELEXA) 20 MG tablet Take 1 tablet (20 mg total) by mouth daily. 01/12/21   Barton Dubois, MD  cyanocobalamin (,VITAMIN B-12,) 1000 MCG/ML injection Inject 1,000 mcg into the muscle every 30 (thirty) days.      [provider]  dicyclomine (BENTYL) 20 MG tablet Take  1 tablet (20 mg total) by mouth 3 (three) times daily before meals. Patient taking differently: Take 20 mg by mouth in the morning and at bedtime. 01/12/21   Barton Dubois, MD  diltiazem (CARDIZEM CD) 180 MG 24 hr capsule Take 1 capsule (180 mg total) by mouth daily. 09/06/21 10/06/21  Antonieta Pert, MD  diphenoxylate-atropine (LOMOTIL) 2.5-0.025 MG tablet 1-2 tablets qid prn diarrhea Instead of Imodium Patient not taking: No sig reported 12/06/20   Gatha Mayer, MD  epoetin alfa-epbx (RETACRIT) 3000 UNIT/ML injection Inject 3,000 Units into the skin every 14 (fourteen) days. 11/21/20   Bhutani, Lavina Hamman, MD  esomeprazole (NEXIUM) 40 MG capsule Take 40 mg by mouth  daily before breakfast.      [provider]  fenofibrate (TRICOR) 145 MG tablet Take 1 tablet (145 mg total) by mouth daily. 09/07/20   Arnoldo Lenis, MD  HYDROmorphone (DILAUDID) 2 MG tablet Take 0.5 tablets (1 mg total) by mouth every 4 (four) hours as needed for severe pain. 05/18/21   Isla Pence, MD  insulin detemir (LEVEMIR FLEXTOUCH) 100 UNIT/ML FlexPen Inject 17 Units into the skin at bedtime. 09/06/21   Antonieta Pert, MD  insulin lispro (HUMALOG KWIKPEN) 100 UNIT/ML KwikPen Inject 4 Units into the skin 3 (three) times daily with meals. Sliding scale 09/06/21   Antonieta Pert, MD  labetalol (NORMODYNE) 200 MG tablet Take 1 tablet (200 mg total) by mouth 2 (two) times daily. 09/06/21 10/06/21  Antonieta Pert, MD  lipase/protease/amylase (CREON) 36000 UNITS CPEP capsule Take 1 capsule (36,000 Units total) by mouth with snacks. Patient taking differently: Take 36,000-72,000 Units by mouth See admin instructions. Takes 2 capsules with all meals and takes one capsule with snacks 01/12/21   Barton Dubois, MD  meclizine (ANTIVERT) 25 MG tablet Take 1 tablet by mouth as needed for dizziness or nausea.    [provider]  omega-3 acid ethyl esters (LOVAZA) 1 g capsule Take 3 capsules by mouth 3 (three) times daily. 10/11/20   [provider]  oxyCODONE-acetaminophen (PERCOCET/ROXICET) 5-325 MG tablet Take 1 tablet by mouth every 6 (six) hours as needed for severe pain. 11/25/21   Eustaquio Maize, PA-C  promethazine (PHENERGAN) 25 MG tablet Take 1 tablet (25 mg total) by mouth every 6 (six) hours as needed for nausea or vomiting. 05/18/21   Isla Pence, MD  sodium polystyrene (KAYEXALATE) 15 GM/60ML suspension Take 30gm (143ml) by mouth once. (on 09/07/2021) 09/07/21   Antonieta Pert, MD  temazepam (RESTORIL) 15 MG capsule Take 15 mg by mouth at bedtime as needed for sleep. 04/14/21   [provider]  venlafaxine XR (EFFEXOR-XR) 150 MG 24 hr capsule Take 150 mg by mouth daily.  04/27/21   [provider]      Allergies    Duloxetine hcl, Fluocinolone, Acetaminophen, Amlodipine besylate, Ciprofloxacin, Hydrocodone-acetaminophen, Metformin, Nsaids, Quinolones, Sulfamethoxazole, Valsartan, Cozaar [losartan], Lisinopril, and Tape    Review of Systems   Review of Systems  Constitutional:  Positive for activity change and chills.  Musculoskeletal:  Positive for arthralgias, back pain and neck pain.  Skin:  Positive for wound.  Neurological:  Positive for weakness and headaches.  Psychiatric/Behavioral:  Positive for agitation and confusion.    Physical Exam Updated Vital Signs BP (!) 161/92 (BP Location: Left Arm)    Pulse (!) 116    Temp 98.6 F (37 C) (Oral)    Resp 18    Ht 5\' 4"  (1.626 m)    Wt 75 kg  LMP  (LMP Unknown)    SpO2 98%    BMI 28.38 kg/m  Physical Exam Vitals and nursing note reviewed.  Constitutional:      General: She is not in acute distress.    Appearance: Normal appearance. She is ill-appearing.  HENT:     Head: Normocephalic and atraumatic.  Eyes:     General: No scleral icterus.    Conjunctiva/sclera: Conjunctivae normal.  Pulmonary:     Effort: Pulmonary effort is normal. No respiratory distress.  Musculoskeletal:        General: Tenderness and deformity present.     Comments: Tenderness on the right clavicle and humeral head.  Arm in sling.  Tenderness over right hip and femur.  Right lower extremity obviously shortened when compared to the left.  Strong DP pulses.  Skin:    Findings: No rash.     Comments: Wound on left dorsal hand.  Circular with necrotic skin.  Surrounding cellulitis.  Photo in chart  Neurological:     Mental Status: She is alert.  Psychiatric:        Mood and Affect: Mood normal.        Behavior: Behavior normal.    ED Results / Procedures / Treatments   Labs (all labs ordered are listed, but only abnormal results are displayed) Labs Reviewed - No data to display  EKG None  Radiology DG  Pelvis 1-2 Views  Result Date: 11/28/2021 CLINICAL DATA:  Multiple falls. EXAM: PELVIS - 1-2 VIEW COMPARISON:  01/07/2021 FINDINGS: Previous ORIF of the right femur. IM nail and hip screw remains stable in position from previous exam. No signs of acute fracture or dislocation. Signs of previous lumbar pedicle screw and posterior rod fixation. Moderate degenerative changes are noted within both hip joints. IMPRESSION: 1. No acute findings. 2. Status post ORIF of the right femur. Electronically Signed   By: Kerby Moors M.D.   On: 11/28/2021 14:24   DG Shoulder Right  Result Date: 11/28/2021 CLINICAL DATA:  Multiple falls. Right shoulder pain with limited range of motion. EXAM: RIGHT SHOULDER - 2+ VIEW COMPARISON:  Radiographs and CT 11/25/2021. FINDINGS: AP and Y-views are submitted. The comminuted and moderately displaced fracture of the right humeral head and neck has not significantly changed. There is no dislocation. Old fracture of the distal clavicle noted. Patient is status post fusion at the cervicothoracic junction. IMPRESSION: No significant change in comminuted and displaced fracture of the right humeral head and neck over the last 3 days. Electronically Signed   By: Richardean Sale M.D.   On: 11/28/2021 14:25   CT Head Wo Contrast  Result Date: 11/28/2021 CLINICAL DATA:  Head trauma, moderate-severe.  Fell on right side. EXAM: CT HEAD WITHOUT CONTRAST TECHNIQUE: Contiguous axial images were obtained from the base of the skull through the vertex without intravenous contrast. RADIATION DOSE REDUCTION: This exam was performed according to the departmental dose-optimization program which includes automated exposure control, adjustment of the mA and/or kV according to patient size and/or use of iterative reconstruction technique. COMPARISON:  03/29/2021 and MRI 08/27/2021 FINDINGS: Brain: Small low-density areas along the medial basal ganglia bilaterally. Findings likely represent small lacune  infarcts and were present on the previous MRI. Negative for acute hemorrhage, mass lesion, midline shift, hydrocephalus or new large infarct. Vascular: No hyperdense vessel or unexpected calcification. Skull: Normal. Negative for fracture or focal lesion. Sinuses/Orbits: Mild mucosal thickening in the ethmoid air cells. Other: None IMPRESSION: 1. No acute intracranial abnormality. 2.  Small low-density areas in the bilateral basal ganglia. These are suggestive for remote lacune infarcts. Electronically Signed   By: Markus Daft M.D.   On: 11/28/2021 14:37   CT Cervical Spine Wo Contrast  Result Date: 11/28/2021 CLINICAL DATA:  Neck trauma, dangerous injury mechanism. Fell on right side. EXAM: CT CERVICAL SPINE WITHOUT CONTRAST TECHNIQUE: Multidetector CT imaging of the cervical spine was performed without intravenous contrast. Multiplanar CT image reconstructions were also generated. RADIATION DOSE REDUCTION: This exam was performed according to the departmental dose-optimization program which includes automated exposure control, adjustment of the mA and/or kV according to patient size and/or use of iterative reconstruction technique. COMPARISON:  08/22/2015 FINDINGS: Alignment: Alignment is within normal limits. Skull base and vertebrae: Negative for an acute fracture or dislocation. Anterior plate and screw fixation with interbody device at C3-C4. Interbody devices at C3-C4, C4-C5, C5-C6 and C6-C7. Anterior plate and screw fixation with interbody device at C7-T1. Bilateral posterior screws and rods at C7-T1. Soft tissues and spinal canal: Thyroid tissue is unremarkable. No significant soft tissue swelling or lymphadenopathy in the neck. Disc levels:  Cervical fusion from C3 through T1. Upper chest: Interstitial thickening at the lung apices. Negative for pneumothorax. Other: None IMPRESSION: 1. No acute bone abnormality in the cervical spine. 2. Extensive postoperative changes with surgical fusion from C3 through  T1. Electronically Signed   By: Markus Daft M.D.   On: 11/28/2021 14:51   CT Hip Right Wo Contrast  Result Date: 11/28/2021 CLINICAL DATA:  Status post fall 1 month ago.  Right hip pain. EXAM: CT OF THE RIGHT HIP WITHOUT CONTRAST TECHNIQUE: Multidetector CT imaging of the right hip was performed according to the standard protocol. Multiplanar CT image reconstructions were also generated. RADIATION DOSE REDUCTION: This exam was performed according to the departmental dose-optimization program which includes automated exposure control, adjustment of the mA and/or kV according to patient size and/or use of iterative reconstruction technique. COMPARISON:  None. FINDINGS: Bones/Joint/Cartilage No acute fracture or dislocation. Healed proximal right femoral fracture transfixed with a intramedullary nail and interlocking screw without failure or complication. Mild-moderate osteoarthritis of the right hip. Mild heterotopic calcification adjacent to the right greater trochanter. Normal alignment. No joint effusion. At L4-5 there is a broad-based disc bulge, moderate bilateral facet arthropathy, and mild spinal stenosis. At L5-S1 there is moderate bilateral facet arthropathy. Mild osteoarthritis of the right SI joint. Ligaments Ligaments are suboptimally evaluated by CT. Muscles and Tendons Muscles are normal.  No muscle atrophy. Soft tissue Abdominal aortic atherosclerosis. Extensive soft tissue contusion in the subcutaneous fat overlying the right hip. 6.3 x 4.6 x 3.5 cm hematoma in the subcutaneous fat overlying the greater trochanter. No soft tissue mass. IMPRESSION: 1.  No acute osseous injury of the right hip. 2. Soft tissue contusion in subcutaneous fat overlying the right hip with a 6.3 x 4.6 x 3.5 cm hematoma. 3. Healed remote right proximal femoral fracture. 4.  Aortic Atherosclerosis (ICD10-I70.0). 5. Moderate osteoarthritis of the right hip. Electronically Signed   By: Kathreen Devoid M.D.   On: 11/28/2021 15:56    DG Femur Min 2 Views Right  Result Date: 11/28/2021 CLINICAL DATA:  Multiple falls.  Right hip pain. EXAM: RIGHT FEMUR 2 VIEWS COMPARISON:  Right hip and knee radiographs 06/15/2018. FINDINGS: Status post proximal right femoral intramedullary rod and dynamic screw fixation of a comminuted intertrochanteric proximal femur fracture. The hardware is intact without loosening. Status post right total knee arthroplasty. The hardware is intact without loosening.  No evidence of acute fracture or dislocation. No significant knee joint effusion. The bones are demineralized. There is diffuse femoral atherosclerosis. The soft tissues appear diffusely edematous without unexpected foreign body. IMPRESSION: No acute osseous findings status post proximal right femoral ORIF and total knee arthroplasty. Possible generalized soft tissue swelling. Electronically Signed   By: Richardean Sale M.D.   On: 11/28/2021 14:28    Procedures .Critical Care Performed by: Rhae Hammock, PA-C Authorized by: Rhae Hammock, PA-C   Critical care provider statement:    Critical care time (minutes):  60   Critical care time was exclusive of:  Separately billable procedures and treating other patients   Critical care was necessary to treat or prevent imminent or life-threatening deterioration of the following conditions:  Circulatory failure, sepsis, renal failure and dehydration   Critical care was time spent personally by me on the following activities:  Development of treatment plan with patient or surrogate, discussions with consultants, evaluation of patient's response to treatment, examination of patient, ordering and review of laboratory studies, ordering and review of radiographic studies, ordering and performing treatments and interventions, pulse oximetry, re-evaluation of patient's condition and review of old charts   Care discussed with: admitting provider     Medications Ordered in ED Medications  0.9 %   sodium chloride infusion (0 mL/hr Intravenous Hold 11/28/21 1707)  lactated ringers infusion (0 mLs Intravenous Stopped 11/28/21 1846)  lactated ringers bolus 1,000 mL (1,000 mLs Intravenous New Bag/Given 11/28/21 1846)    And  lactated ringers bolus 1,000 mL (1,000 mLs Intravenous New Bag/Given 11/28/21 1847)    And  lactated ringers bolus 500 mL (500 mLs Intravenous New Bag/Given 11/28/21 1909)  vancomycin (VANCOCIN) IVPB 1000 mg/200 mL premix (1,000 mg Intravenous New Bag/Given 11/28/21 1857)  fentaNYL (SUBLIMAZE) injection 50 mcg (50 mcg Intravenous Given 11/28/21 1440)  diazepam (VALIUM) tablet 5 mg (5 mg Oral Given 11/28/21 1515)  HYDROmorphone (DILAUDID) injection 0.5 mg (0.5 mg Intravenous Given 11/28/21 1533)  cefTRIAXone (ROCEPHIN) 2 g in sodium chloride 0.9 % 100 mL IVPB (0 g Intravenous Stopped 11/28/21 1759)  acetaminophen (TYLENOL) 500 MG tablet (  Given 11/28/21 1750)  LORazepam (ATIVAN) injection 2 mg (2 mg Intravenous Given 11/28/21 1855)    ED Course/ Medical Decision Making/ A&P                           Medical Decision Making Amount and/or Complexity of Data Reviewed Labs: ordered. Radiology: ordered.  Risk Prescription drug management. Decision regarding hospitalization.   Patient presents to the ED for concern of hip fracture and reentry of her right upper extremity.  Also concerned about a wound on her left dorsal hand that she obtained a few weeks ago when her IV in the hospital infiltrated.  Family member reports that she has been using bacitracin and taking Keflex for this.   Co morbidities that complicate the patient evaluation  Memory problems, diabetes and CKD  Additional history obtained:  Additional history obtained from internal chart review of patient's hospitalizations.   Lab Tests:  I Ordered, and personally interpreted labs.  The pertinent results include:   -Hemoglobin 6.5 -Creatinine 3.87 and GFR 12, much worse than her labs 3 days ago where her  creatinine was 2.82 and GFR 18    Imaging Studies ordered:  I ordered imaging studies including x-ray of right shoulder, pelvis, right femur and CTs of the head and neck. I independently visualized  and interpreted imaging which showed no new injuries.  Her comminuted and displaced right humeral head fracture is still noted.  I agree with the radiologist interpretation of no changes since her imaging 4 days ago in her right upper extremity.  Also agree with a negative read of right hip x-ray.  CT was ordered due to patient's severe pain, this was also negative for fracture or dislocation.   Cardiac Monitoring:  The patient was maintained on a cardiac monitor.  I personally viewed and interpreted the cardiac monitored which showed an underlying rhythm of: Sinus tachycardia   Medicines ordered and prescription drug management:  I ordered medication including fentanyl for pain.  This helped however wore off, given Dilaudid which also helped.  Given Valium for anxiety and 1 dose of Ativan per nursing request.   Critical Interventions:  Initiation of intravenous fluids and antibiotics per sepsis protocol.  Also type and screen to ensure timely blood transfusion due to low hemoglobin.   Problem List / ED Course:  Anemia, AKI on chronic CKD, GI bleeding, sepsis potentially secondary to wound on hand  Reevaluation:  After the interventions noted above, I reevaluated the patient and found that they have :stayed the same   Dispostion:  After consideration of the diagnostic results and the patients response to treatment, I feel that the patent would benefit from admission for further work-up of her illnesses, hydration and reassessment after blood transfusion/antibiotics.  Final Clinical Impression(s) / ED Diagnoses Final diagnoses:  Anemia, unspecified type  Cellulitis of left upper extremity  Sepsis without acute organ dysfunction, due to unspecified organism [facility])   Gastrointestinal hemorrhage, unspecified gastrointestinal hemorrhage type    Rx / DC Orders Admit to hospitalist, Kellie Simmering, PA-C 11/28/21 1927    Godfrey Pick, MD 12/01/21 1818

## 2021-11-28 NOTE — Progress Notes (Signed)
°   11/28/21 2105  Vitals  Temp (!) 100.6 F (38.1 C)  Temp Source Rectal  BP (!) 196/92  BP Location Left Arm  BP Method Automatic  Patient Position (if appropriate) Lying  Pulse Rate (!) 134  Pulse Rate Source Dinamap  Resp 18  Level of Consciousness  Level of Consciousness Alert  Oxygen Therapy  SpO2 98 %  O2 Device Room Air  Pain Assessment  Pain Scale 0-10  Pain Score 10  Pain Type Acute pain  Pain Location Shoulder  Pain Orientation Right  Pain Descriptors / Indicators Throbbing  Pain Frequency Constant  Pain Intervention(s) Rest  Multiple Pain Sites Yes  2nd Pain Site  Pain Score 10  Pain Type Acute pain  Pain Location Hip  Pain Orientation Right  Pain Descriptors / Indicators Throbbing  Pain Onset On-going  Pain Intervention(s) Rest (MD notified)  Height and Weight  Height 5\' 3"  (1.6 m)  Weight 73.4 kg  Type of Scale Used Bed  Type of Weight Actual  BSA (Calculated - sq m) 1.81 sq meters  BMI (Calculated) 28.67  Weight in (lb) to have BMI = 25 140.8  Provider Notification  Provider Name/Title Dr. Denton Brick  Date Provider Notified 11/28/21  Time Provider Notified 2110  Notification Type Page (Via Shea Evans)  Notification Reason Other (Comment) (New admit MEWS 4)  Provider response See new orders  Date of Provider Response 11/28/21  Time of Provider Response 2119   Patient new admit to [address]. Patient oriented to self. Unit of PRBC's transfusing on admit. Spoke with Dr. Denton Brick. Due to patient's vital signs at this time, verbal order to hold off on left hand CT until heart rate and blood pressure are stable. Notified CT. Awaiting new orders.

## 2021-11-29 ENCOUNTER — Ambulatory Visit: Payer: 59 | Admitting: Orthopedic Surgery

## 2021-11-29 DIAGNOSIS — S42291A Other displaced fracture of upper end of right humerus, initial encounter for closed fracture: Secondary | ICD-10-CM

## 2021-11-29 DIAGNOSIS — N183 Chronic kidney disease, stage 3 unspecified: Secondary | ICD-10-CM

## 2021-11-29 LAB — HEMOGLOBIN AND HEMATOCRIT, BLOOD
HCT: 24.1 % — ABNORMAL LOW (ref 36.0–46.0)
Hemoglobin: 7.8 g/dL — ABNORMAL LOW (ref 12.0–15.0)

## 2021-11-29 LAB — GLUCOSE, CAPILLARY
Glucose-Capillary: 139 mg/dL — ABNORMAL HIGH (ref 70–99)
Glucose-Capillary: 150 mg/dL — ABNORMAL HIGH (ref 70–99)
Glucose-Capillary: 152 mg/dL — ABNORMAL HIGH (ref 70–99)
Glucose-Capillary: 148 mg/dL — ABNORMAL HIGH (ref 70–99)

## 2021-11-29 MED ORDER — GABAPENTIN 300 MG PO CAPS: 300.0000 mg | ORAL_CAPSULE | Freq: Every evening | ORAL | Status: DC | PRN

## 2021-11-29 MED ORDER — METOPROLOL SUCCINATE ER 50 MG PO TB24
50.0000 mg | ORAL_TABLET | Freq: Every day | ORAL | Status: DC
  Administered 2021-11-30 – 2021-12-01 (×2): 50 mg via ORAL
  Filled 2021-11-29 (×2): qty 1

## 2021-11-29 MED ORDER — VITAMIN D 25 MCG (1000 UNIT) PO TABS
2000.0000 [IU] | ORAL_TABLET | Freq: Every morning | ORAL | Status: DC
  Administered 2021-11-30 – 2021-12-01 (×2): 2000 [IU] via ORAL
  Filled 2021-11-29 (×2): qty 2

## 2021-11-29 MED ORDER — METOPROLOL SUCCINATE ER 50 MG PO TB24: 50.0000 mg | ORAL_TABLET | Freq: Every day | ORAL | Status: DC

## 2021-11-29 MED ORDER — DICYCLOMINE HCL 10 MG PO CAPS
20.0000 mg | ORAL_CAPSULE | Freq: Two times a day (BID) | ORAL | Status: DC
  Administered 2021-11-29 – 2021-12-01 (×4): 20 mg via ORAL
  Filled 2021-11-29 (×5): qty 2

## 2021-11-29 MED ORDER — LACTATED RINGERS IV SOLN: INTRAVENOUS | Status: AC

## 2021-11-29 MED ORDER — OMEGA-3-ACID ETHYL ESTERS 1 G PO CAPS
1.0000 g | ORAL_CAPSULE | Freq: Two times a day (BID) | ORAL | Status: DC
  Administered 2021-11-29 – 2021-12-01 (×5): 1 g via ORAL
  Filled 2021-11-29 (×5): qty 1

## 2021-11-29 MED ORDER — SODIUM BICARBONATE 650 MG PO TABS
650.0000 mg | ORAL_TABLET | Freq: Three times a day (TID) | ORAL | Status: DC
  Administered 2021-11-29 – 2021-12-01 (×5): 650 mg via ORAL
  Filled 2021-11-29 (×5): qty 1

## 2021-11-29 MED ORDER — FENOFIBRATE 160 MG PO TABS
160.0000 mg | ORAL_TABLET | Freq: Every day | ORAL | Status: DC
  Administered 2021-11-30 – 2021-12-01 (×2): 160 mg via ORAL
  Filled 2021-11-29 (×2): qty 1

## 2021-11-29 MED ORDER — DIAZEPAM 5 MG PO TABS: 5.0000 mg | ORAL_TABLET | Freq: Two times a day (BID) | ORAL | Status: DC | PRN

## 2021-11-29 MED ORDER — LACTATED RINGERS IV SOLN: INTRAVENOUS | Status: DC

## 2021-11-29 MED ORDER — CALCIUM CARBONATE ANTACID 500 MG PO CHEW
1250.0000 mg | CHEWABLE_TABLET | Freq: Two times a day (BID) | ORAL | Status: DC
  Administered 2021-11-30 – 2021-12-01 (×3): 1250 mg via ORAL
  Filled 2021-11-29 (×3): qty 7

## 2021-11-29 MED ORDER — CALCITRIOL 0.25 MCG PO CAPS
0.2500 ug | ORAL_CAPSULE | ORAL | Status: DC
  Administered 2021-12-01: 0.25 ug via ORAL
  Filled 2021-11-29: qty 1

## 2021-11-29 MED ORDER — LEVETIRACETAM 500 MG PO TABS
500.0000 mg | ORAL_TABLET | Freq: Two times a day (BID) | ORAL | Status: DC
  Administered 2021-11-29 – 2021-12-01 (×5): 500 mg via ORAL
  Filled 2021-11-29 (×5): qty 1

## 2021-11-29 MED ORDER — VENLAFAXINE HCL ER 75 MG PO CP24
75.0000 mg | ORAL_CAPSULE | Freq: Every day | ORAL | Status: DC
  Administered 2021-11-29 – 2021-12-01 (×3): 75 mg via ORAL
  Filled 2021-11-29 (×3): qty 1

## 2021-11-29 MED ORDER — VANCOMYCIN HCL 750 MG/150ML IV SOLN: 750.0000 mg | INTRAVENOUS | Status: DC

## 2021-11-29 NOTE — TOC Initial Note (Signed)
Transition of Care [facility]) - Initial/Assessment Note    Patient Details  Name: Heather [name] MRN: [id number] Date of Birth: [birth year]  Transition of Care [facility]) CM/SW Contact:    Salome Arnt, [place] Phone Number: 11/29/2021, 2:40 PM  Clinical Narrative: Pt admitted due to sepsis. LCSW completed assessment with pt's husband as pt oriented to self and place only. Pt's husband reports pt d/c home from [facility] last Friday. She fell on Saturday at home. Husband indicates home health was set up, but he isn't sure which agency. PT evaluated pt and recommend SNF. Discussed with husband and he is aware pt only has about a week left before copay days start. He wants pt to return to [facility]. LCSW called [facility] admissions and they will review referral, but should be able to accept again. No FL2 needed. [facility] will start [employer]. Pt will need COVID test 24-48 hours prior to d/c. TOC will follow up in AM.                  Expected Discharge Plan: [place] Barriers to Discharge: Continued Medical Work up   Patient Goals and CMS Choice Patient states their goals for this hospitalization and ongoing recovery are:: short term SNF   Choice offered to / list presented to : Spouse  Expected Discharge Plan and Services Expected Discharge Plan: [place] In-house Referral: Clinical Social Work   Post Acute Care Choice: [place] Living arrangements for the past 2 months: [place]                                      Prior Living Arrangements/Services Living arrangements for the past 2 months: Single Family Home Lives with:: Spouse Patient language and need for interpreter reviewed:: Yes Do you feel safe going back to the place where you live?: Yes      Need for Family Participation in Patient Care: Yes (Comment)   Current home services: DME ([facility], [facility]) Criminal Activity/Legal Involvement Pertinent to  Current Situation/Hospitalization: No - Comment as needed  Activities of Daily Living   ADL Screening (condition at time of admission) Patient's cognitive ability adequate to safely complete daily activities?: No Does the patient have difficulty concentrating, remembering, or making decisions?: Yes Weakness of Arms/Hands: Both  Permission Sought/Granted                  Emotional Assessment   Attitude/Demeanor/Rapport: Unable to Assess Affect (typically observed): Unable to Assess Orientation: : Oriented to Self, Oriented to Place Alcohol / Substance Use: Not Applicable Psych Involvement: No (comment)  Admission diagnosis:  Fall [W19.XXXA] Cellulitis of left upper extremity [T55.732] Sepsis ([place]) [A41.9] Gastrointestinal hemorrhage, unspecified gastrointestinal hemorrhage type [K92.2] Anemia, unspecified type [D64.9] Sepsis without acute organ dysfunction, due to unspecified organism [facility]) [A41.9] Patient Active Problem List   Diagnosis Date Noted   At risk for falls 11/28/2021   Neoplasm of meninges 11/28/2021   Sepsis ([place]) 11/28/2021   Open wound of left hand 11/28/2021   Acute on chronic anemia 11/28/2021   Cerebral thrombosis with cerebral infarction 08/28/2021   Cerebrovascular accident (CVA) due to embolism of cerebral artery ([place])    Pressure injury of skin 08/26/2021   Acute respiratory failure with hypoxia ([place]) 20/25/4270   Acute metabolic encephalopathy 62/37/6283   Malignant hypertension 08/25/2021   Thrombocytopenia ([place]) 08/25/2021   Anemia due to  chronic kidney disease 08/25/2021   Blurry vision, bilateral 08/16/2021   Hyponatremia 04/01/2021   COVID-19 03/30/2021   Senile osteoporosis 02/10/2021   Acute pancreatitis    Anemia    E coli enteritis 01/09/2021   Acute on chronic pancreatitis ([place]) 01/08/2021   Acute renal failure superimposed on stage 3b chronic kidney disease ([place]) 01/07/2021   Diarrhea    Acute hypokalemia 01/06/2021   Long-term  use of immunosuppressant medication-anticipated 11/28/2020   Elevated blood-pressure reading, without diagnosis of hypertension 05/30/2020   Meningioma ([place]) 05/30/2020   Headache 05/17/2020   Hypokalemia 05/17/2020   Hypomagnesemia 05/17/2020   Dehydration 05/17/2020   Hypertensive urgency 05/17/2020   Diplopia 05/17/2020   Left hip pain 09/22/2019   Lumbar radiculopathy 09/22/2019   Postoperative anemia due to acute blood loss 09/18/2019   Postoperative urinary retention 09/18/2019   Closed fracture of distal end of radius 05/26/2019   Coronary arteriosclerosis 07/15/2018   Closed right hip fracture ([place]) 04/08/2018   Elevated lipase    Crohn's disease of small intestine with other complication ([place])    Protein-calorie malnutrition, severe 03/05/2018   Neck pain 03/12/2017   Acute kidney injury superimposed on CKD ([place]) 01/25/2016   Neuropathy 03/02/2015   Pain in the chest    Chest pain 12/22/2014   Essential hypertension 12/22/2014   Type 2 diabetes with nephropathy ([place]) 12/22/2014   Hiatal hernia 12/22/2014   GERD (gastroesophageal reflux disease) 12/22/2014   Gout 12/22/2014   Mixed hyperlipidemia 12/22/2014   Depression 12/22/2014   Type 2 diabetes mellitus without complications ([place]) 44/96/7591   Fusion of spine, cervicothoracic region 11/01/2014   Pseudophakia, left eye 10/20/2014   Chronic kidney disease, stage III (moderate) ([place]) 08/25/2014   Cervical stenosis of spine 08/26/2013   Vitamin B12 deficiency 07/08/2011   Personal history of failed moderate sedation 04/16/2011   Chronic Nausea and Vomiting - ? gastroapresis 04/16/2011   Early satiety 04/16/2011   Irritable bowel syndrome 04/16/2011   Benign paroxysmal positional vertigo 11/20/2010   TRICUSPID REGURGITATION, MODERATE 11/20/2010   PULMONARY HYPERTENSION, MILD 11/20/2010   LEG EDEMA, BILATERAL 11/20/2010   DYSPNEA 11/20/2010   NONSPECIFIC ABNORMAL ELECTROCARDIOGRAM 11/20/2010   CHEST WALL PAIN, HX  OF 11/20/2010   PCP:  [name], MD Pharmacy:   [place], [place] [address] [place] [address] [address] Phone: [phone] Fax: [facility] [place], [place] - [address] [place] [address] Phone: [phone] Fax: [phone]     Social Determinants of Health (SDOH) Interventions    Readmission Risk Interventions Readmission Risk Prevention Plan 11/29/2021 08/21/2021 01/09/2021  Transportation Screening Complete Complete Complete  PCP or Specialist Appt within 3-5 Days - - Complete  HRI or Home Care Consult Complete - Complete  Social Work Consult for [place] Planning/Counseling Complete - Complete  Palliative Care Screening Not Applicable - Not Applicable  Medication Review Press photographer) Complete Complete Complete  HRI or Home Care Consult - Complete -  SW Recovery Care/Counseling Consult - Complete -  Palliative Care Screening - Not Applicable -  [place] - Not Applicable -  Some recent data might be hidden

## 2021-11-29 NOTE — Progress Notes (Signed)
Patient transported to CT by nursing staff. Heart rate 107 at this time.

## 2021-11-29 NOTE — Plan of Care (Signed)
°  Problem: Acute Rehab PT Goals(only PT should resolve) Goal: Pt Will Go Supine/Side To Sit Outcome: Progressing Flowsheets (Taken 11/29/2021 1434) Pt will go Supine/Side to Sit: with moderate assist Goal: Patient Will Transfer Sit To/From Stand Outcome: Progressing Flowsheets (Taken 11/29/2021 1434) Patient will transfer sit to/from stand:  with min guard assist  with minimal assist Goal: Pt Will Transfer Bed To Chair/Chair To Bed Outcome: Progressing Flowsheets (Taken 11/29/2021 1434) Pt will Transfer Bed to Chair/Chair to Bed:  with min assist  with mod assist Goal: Pt Will Ambulate Outcome: Progressing Flowsheets (Taken 11/29/2021 1434) Pt will Ambulate:  25 feet  with moderate assist  with cane   2:34 PM, 11/29/21 Lonell Grandchild, MPT Physical Therapist with [facility] [phone] office [phone] mobile phone

## 2021-11-29 NOTE — Progress Notes (Addendum)
PROGRESS NOTE   Heather [name]  VQQ:[id number] DOB: [birth year] DOA: 11/28/2021 PCP: [name], MD   Chief Complaint  Patient presents with   [facility] multiple falls since Sunday, brain tumor effecting balance & causing seizures x 47mos, pt c/o R hip pain, pt states she hit her head on Sunday, scab noted above R eyebrow, no blood thinners    Level of care: Telemetry  Brief Admission History:   71 y.o. female with medical history significant for diabetes mellitus, hypertension, CKD 3, memory problems. Patient brought to the ED with reports of multiple falls.  Friday 1/28, patient was diagnosed with comminuted fracture of the right humeral head and right shoulder dislocation.  2 days ago Sunday, patient fell again hitting her head and neck.  Yesterday Monday, patient fell on her right hip.  Patient was brought to the ED reports that her right hip did not look normal.  Family is also worried that patient's memory and gait is getting worse,. Family also reported patient's IV infiltrated during her last hospitalization, she was prescribed bacitracin ointment and Keflex for the infection.  The area now has an ulcer and is looking worse with current redness.   Patient was seen in the ED, 1/28, she was to follow-up with [name] for evaluation of her shoulder fracture.   ED Course: Febrile to 101.5.  Tachycardic heart rate 116-132.  Respiratory rate 13-26.  Blood pressure systolic 433I to 951O.  O2 sats greater than 96% on room air. WBC 8.9.  Creatinine 3.87.  Lactic acid 1. Shoulder x-ray redemonstrates comminuted and displaced fracture of the right humeral head Female pelvic x-ray without acute abnormality. Right hip CT without acute abnormality, but shows a 6 x 4 x 3 cm hematoma. Head and cervical CT without acute abnormality. IV Vanco and ceftriaxone started.   Assessment & Plan:   Principal Problem:   Sepsis ([place]) Active Problems:   Chronic kidney disease, stage III  (moderate) (HCC)   Essential hypertension   Type 2 diabetes with nephropathy (HCC)   Depression   Acute kidney injury superimposed on CKD (HCC)   Crohn's disease of small intestine with other complication (HCC)   Cerebral thrombosis with cerebral infarction   Open wound of left hand   Acute on chronic anemia   Sepsis secondary to left hand cellulitis/tenosynovitis  - continue IV antibiotics - consult to orthopedics  - follow cultures  Left hand cellulitis  - pt reported started from IV infiltration from previous hospitalization  - IV antibiotics, follow cultures - consult to orthopedics  Multiple falls/gait instability - PT recommending SNF, was recently discharged from SNF per social worker - [facility] to [facility]  Acute right shoulder / arm fracture with dislocation  - continue sling immobilization and pain management  - consult to orthopedics  Essential hypertension  - resume home medications (just recently reconciled late 2/1)   Crohn's Disease - no active symptoms at this time.  Follow.   Type 2 diabetes mellitus controlled - continue SSI coverage and CBG monitoring   Acute on chronic kidney injury 4-creatinine 3.87, last check 3 days ago creatinine was 2.8. - Hydrate - Hold home lasix 40mg  daily, valsartan temporarily - Resume home bicarbonate   DVT prophylaxis: sq heparin Code Status: Full  Family Communication: t/c to son 2/1 Disposition: TBD Status is: Inpatient Remains inpatient appropriate because: uncontrolled pain   Planned Discharge Destination: Skilled nursing facility   Consultants:  Orthopedics   Procedures:  Antimicrobials:  Ceftriaxone 1/31>>   Subjective: Pt complains of ongoing pain in right shoulder and arm.  Also has left hand pain.   Objective: Vitals:   11/29/21 0849 11/29/21 0851 11/29/21 1100 11/29/21 1149  BP: 134/74  (!) 150/103 (!) 157/79  Pulse:  88 (!) 115 (!) 118  Resp:   16 18  Temp:   99.9 F (37.7 C) 98.8 F (37.1  C)  TempSrc:   Oral   SpO2:   98% 99%  Weight:      Height:        Intake/Output Summary (Last 24 hours) at 11/29/2021 1322 Last data filed at 11/29/2021 1044 Gross per 24 hour  Intake 1859.23 ml  Output 500 ml  Net 1359.23 ml   Filed Weights   11/28/21 1318 11/28/21 2105  Weight: 75 kg 73.4 kg    Examination:  General exam: Appears calm and comfortable  Respiratory system: Clear to auscultation. Respiratory effort normal. Cardiovascular system: normal S1 & S2 heard. No JVD, murmurs, rubs, gallops or clicks. No pedal edema. Gastrointestinal system: Abdomen is nondistended, soft and nontender. No organomegaly or masses felt. Normal bowel sounds heard. Central nervous system: Alert and oriented. No focal neurological deficits. Extremities: right arm in sling.  Left hand with large ventral circular eschar with surrounding erythema, tender to palpation c/w cellulitis.   Skin: No rashes, lesions or ulcers Psychiatry: Judgement and insight appear poor. Mood & affect flat.   Data Reviewed: I have personally reviewed following labs and imaging studies  CBC: Recent Labs  Lab 11/25/21 1500 11/28/21 1437 11/29/21 0050  WBC 10.1 8.9  --   NEUTROABS 8.5* 7.3  --   HGB 7.9* 6.5* 7.8*  HCT 24.8* 20.5* 24.1*  MCV 98.0 99.5  --   PLT 268 225  --     Basic Metabolic Panel: Recent Labs  Lab 11/25/21 1500 11/28/21 1437  NA 137 138  K 4.0 4.0  CL 107 107  CO2 19* 20*  GLUCOSE 180* 70  BUN 42* 56*  CREATININE 2.82* 3.87*  CALCIUM 8.3* 7.9*    GFR: Estimated Creatinine Clearance: 13.2 mL/min (A) (by C-G formula based on SCr of 3.87 mg/dL (H)).  Liver Function Tests: No results for input(s): AST, ALT, ALKPHOS, BILITOT, PROT, ALBUMIN in the last 168 hours.  CBG: Recent Labs  Lab 11/28/21 2112 11/29/21 0847 11/29/21 1242  GLUCAP 102* 139* 150*    Recent Results (from the past 240 hour(s))  Culture, blood (single)     Status: None (Preliminary result)   Collection  Time: 11/28/21  5:50 PM   Specimen: BLOOD RIGHT HAND  Result Value Ref Range Status   Specimen Description BLOOD RIGHT HAND  Final   Special Requests   Final    BOTTLES DRAWN AEROBIC AND ANAEROBIC Blood Culture adequate volume   Culture   Final    NO GROWTH < 24 HOURS Performed at [facility], [address], [place], [place] [address]    Report Status PENDING  Incomplete  Resp Panel by RT-PCR (Flu A&B, Covid) Nasopharyngeal Swab     Status: None   Collection Time: 11/28/21  6:38 PM   Specimen: Nasopharyngeal Swab; Nasopharyngeal(NP) swabs in vial transport medium  Result Value Ref Range Status   SARS Coronavirus 2 by RT PCR NEGATIVE NEGATIVE Final    Comment: (NOTE) SARS-CoV-2 target nucleic acids are NOT DETECTED.  The SARS-CoV-2 RNA is generally detectable in upper respiratory specimens during the acute phase of infection. The lowest  concentration of SARS-CoV-2 viral copies this assay can detect is 138 copies/mL. A negative result does not preclude SARS-Cov-2 infection and should not be used as the sole basis for treatment or other patient management decisions. A negative result may occur with  improper specimen collection/handling, submission of specimen other than nasopharyngeal swab, presence of viral mutation(s) within the areas targeted by this assay, and inadequate number of viral copies(<138 copies/mL). A negative result must be combined with clinical observations, patient history, and epidemiological information. The expected result is Negative.  Fact Sheet for Patients:  EntrepreneurPulse.com.au  Fact Sheet for Healthcare Providers:  IncredibleEmployment.be  This test is no t yet approved or cleared by the [place] FDA and  has been authorized for detection and/or diagnosis of SARS-CoV-2 by FDA under an Emergency Use Authorization (EUA). This EUA will remain  in effect (meaning this test can be used) for the duration of  the COVID-19 declaration under Section 564(b)(1) of the Act, 21 U.S.C.section 360bbb-3(b)(1), unless the authorization is terminated  or revoked sooner.       Influenza A by PCR NEGATIVE NEGATIVE Final   Influenza B by PCR NEGATIVE NEGATIVE Final    Comment: (NOTE) The Xpert Xpress SARS-CoV-2/FLU/RSV plus assay is intended as an aid in the diagnosis of influenza from Nasopharyngeal swab specimens and should not be used as a sole basis for treatment. Nasal washings and aspirates are unacceptable for Xpert Xpress SARS-CoV-2/FLU/RSV testing.  Fact Sheet for Patients: EntrepreneurPulse.com.au  Fact Sheet for Healthcare Providers: IncredibleEmployment.be  This test is not yet approved or cleared by the [place] FDA and has been authorized for detection and/or diagnosis of SARS-CoV-2 by FDA under an Emergency Use Authorization (EUA). This EUA will remain in effect (meaning this test can be used) for the duration of the COVID-19 declaration under Section 564(b)(1) of the Act, 21 U.S.C. section 360bbb-3(b)(1), unless the authorization is terminated or revoked.  Performed at [facility], [address], [place], [place] [address]      Radiology Studies: DG Pelvis 1-2 Views  Result Date: 11/28/2021 CLINICAL DATA:  Multiple falls. EXAM: PELVIS - 1-2 VIEW COMPARISON:  01/07/2021 FINDINGS: Previous ORIF of the right femur. IM nail and hip screw remains stable in position from previous exam. No signs of acute fracture or dislocation. Signs of previous lumbar pedicle screw and posterior rod fixation. Moderate degenerative changes are noted within both hip joints. IMPRESSION: 1. No acute findings. 2. Status post ORIF of the right femur. Electronically Signed   By: Kerby Moors M.D.   On: 11/28/2021 14:24   DG Shoulder Right  Result Date: 11/28/2021 CLINICAL DATA:  Multiple falls. Right shoulder pain with limited range of motion. EXAM: RIGHT SHOULDER  - 2+ VIEW COMPARISON:  Radiographs and CT 11/25/2021. FINDINGS: AP and Y-views are submitted. The comminuted and moderately displaced fracture of the right humeral head and neck has not significantly changed. There is no dislocation. Old fracture of the distal clavicle noted. Patient is status post fusion at the cervicothoracic junction. IMPRESSION: No significant change in comminuted and displaced fracture of the right humeral head and neck over the last 3 days. Electronically Signed   By: Richardean Sale M.D.   On: 11/28/2021 14:25   CT Head Wo Contrast  Result Date: 11/28/2021 CLINICAL DATA:  Head trauma, moderate-severe.  Fell on right side. EXAM: CT HEAD WITHOUT CONTRAST TECHNIQUE: Contiguous axial images were obtained from the base of the skull through the vertex without intravenous contrast. RADIATION DOSE REDUCTION:  This exam was performed according to the departmental dose-optimization program which includes automated exposure control, adjustment of the mA and/or kV according to patient size and/or use of iterative reconstruction technique. COMPARISON:  03/29/2021 and MRI 08/27/2021 FINDINGS: Brain: Small low-density areas along the medial basal ganglia bilaterally. Findings likely represent small lacune infarcts and were present on the previous MRI. Negative for acute hemorrhage, mass lesion, midline shift, hydrocephalus or new large infarct. Vascular: No hyperdense vessel or unexpected calcification. Skull: Normal. Negative for fracture or focal lesion. Sinuses/Orbits: Mild mucosal thickening in the ethmoid air cells. Other: None IMPRESSION: 1. No acute intracranial abnormality. 2. Small low-density areas in the bilateral basal ganglia. These are suggestive for remote lacune infarcts. Electronically Signed   By: Markus Daft M.D.   On: 11/28/2021 14:37   CT Cervical Spine Wo Contrast  Result Date: 11/28/2021 CLINICAL DATA:  Neck trauma, dangerous injury mechanism. Fell on right side. EXAM: CT  CERVICAL SPINE WITHOUT CONTRAST TECHNIQUE: Multidetector CT imaging of the cervical spine was performed without intravenous contrast. Multiplanar CT image reconstructions were also generated. RADIATION DOSE REDUCTION: This exam was performed according to the departmental dose-optimization program which includes automated exposure control, adjustment of the mA and/or kV according to patient size and/or use of iterative reconstruction technique. COMPARISON:  08/22/2015 FINDINGS: Alignment: Alignment is within normal limits. Skull base and vertebrae: Negative for an acute fracture or dislocation. Anterior plate and screw fixation with interbody device at C3-C4. Interbody devices at C3-C4, C4-C5, C5-C6 and C6-C7. Anterior plate and screw fixation with interbody device at C7-T1. Bilateral posterior screws and rods at C7-T1. Soft tissues and spinal canal: Thyroid tissue is unremarkable. No significant soft tissue swelling or lymphadenopathy in the neck. Disc levels:  Cervical fusion from C3 through T1. Upper chest: Interstitial thickening at the lung apices. Negative for pneumothorax. Other: None IMPRESSION: 1. No acute bone abnormality in the cervical spine. 2. Extensive postoperative changes with surgical fusion from C3 through T1. Electronically Signed   By: Markus Daft M.D.   On: 11/28/2021 14:51   CT HAND LEFT WO CONTRAST  Result Date: 11/29/2021 CLINICAL DATA:  Dorsal hand infection at IV site. EXAM: CT OF THE LEFT HAND WITHOUT CONTRAST TECHNIQUE: Multidetector CT imaging of the left hand was performed according to the standard protocol. Multiplanar CT image reconstructions were also generated. RADIATION DOSE REDUCTION: This exam was performed according to the departmental dose-optimization program which includes automated exposure control, adjustment of the mA and/or kV according to patient size and/or use of iterative reconstruction technique. COMPARISON:  Left hand x-rays dated November 12, 2019. FINDINGS:  Bones/Joint/Cartilage No bony destruction or periosteal reaction. No fracture or dislocation. Mild first CMC joint space narrowing with small marginal osteophytes. Degenerative subchondral cysts in the scaphoid and capitate. No joint effusion. Ligaments Ligaments are suboptimally evaluated by CT. Chondrocalcinosis of the scapholunate and lunotriquetral ligaments. Muscles and Tendons Grossly intact. Fluid surrounding the extensor digitorum tendons overlying the distal carpal row and metacarpals. Soft tissue Prominent dorsal hand skin thickening and soft tissue swelling. No fluid collection or subcutaneous emphysema. No soft tissue mass. IMPRESSION: 1. Prominent dorsal hand skin thickening and soft tissue swelling, consistent with cellulitis given clinical history. No abscess. 2. Extensor digitorum tenosynovitis. 3. No acute osseous abnormality. Electronically Signed   By: Titus Dubin M.D.   On: 11/29/2021 08:05   CT Hip Right Wo Contrast  Result Date: 11/28/2021 CLINICAL DATA:  Status post fall 1 month ago.  Right hip pain. EXAM: CT  OF THE RIGHT HIP WITHOUT CONTRAST TECHNIQUE: Multidetector CT imaging of the right hip was performed according to the standard protocol. Multiplanar CT image reconstructions were also generated. RADIATION DOSE REDUCTION: This exam was performed according to the departmental dose-optimization program which includes automated exposure control, adjustment of the mA and/or kV according to patient size and/or use of iterative reconstruction technique. COMPARISON:  None. FINDINGS: Bones/Joint/Cartilage No acute fracture or dislocation. Healed proximal right femoral fracture transfixed with a intramedullary nail and interlocking screw without failure or complication. Mild-moderate osteoarthritis of the right hip. Mild heterotopic calcification adjacent to the right greater trochanter. Normal alignment. No joint effusion. At L4-5 there is a broad-based disc bulge, moderate bilateral facet  arthropathy, and mild spinal stenosis. At L5-S1 there is moderate bilateral facet arthropathy. Mild osteoarthritis of the right SI joint. Ligaments Ligaments are suboptimally evaluated by CT. Muscles and Tendons Muscles are normal.  No muscle atrophy. Soft tissue Abdominal aortic atherosclerosis. Extensive soft tissue contusion in the subcutaneous fat overlying the right hip. 6.3 x 4.6 x 3.5 cm hematoma in the subcutaneous fat overlying the greater trochanter. No soft tissue mass. IMPRESSION: 1.  No acute osseous injury of the right hip. 2. Soft tissue contusion in subcutaneous fat overlying the right hip with a 6.3 x 4.6 x 3.5 cm hematoma. 3. Healed remote right proximal femoral fracture. 4.  Aortic Atherosclerosis (ICD10-I70.0). 5. Moderate osteoarthritis of the right hip. Electronically Signed   By: Kathreen Devoid M.D.   On: 11/28/2021 15:56   DG CHEST PORT 1 VIEW  Result Date: 11/28/2021 CLINICAL DATA:  Sepsis, fall. EXAM: PORTABLE CHEST 1 VIEW COMPARISON:  Chest x-ray 10/18/2021. right shoulder x-ray 11/28/2021. FINDINGS: The heart size and mediastinal contours are within normal limits. Both lungs are clear. Fusion hardware seen in the cervical spine and lumbar spine. There is a comminuted right humeral head and neck fracture, unchanged. IMPRESSION: 1. Comminuted right humeral head and neck fracture, unchanged. 2. No acute cardiopulmonary process. Electronically Signed   By: Ronney Asters M.D.   On: 11/28/2021 22:08   DG Femur Min 2 Views Right  Result Date: 11/28/2021 CLINICAL DATA:  Multiple falls.  Right hip pain. EXAM: RIGHT FEMUR 2 VIEWS COMPARISON:  Right hip and knee radiographs 06/15/2018. FINDINGS: Status post proximal right femoral intramedullary rod and dynamic screw fixation of a comminuted intertrochanteric proximal femur fracture. The hardware is intact without loosening. Status post right total knee arthroplasty. The hardware is intact without loosening. No evidence of acute fracture or  dislocation. No significant knee joint effusion. The bones are demineralized. There is diffuse femoral atherosclerosis. The soft tissues appear diffusely edematous without unexpected foreign body. IMPRESSION: No acute osseous findings status post proximal right femoral ORIF and total knee arthroplasty. Possible generalized soft tissue swelling. Electronically Signed   By: Richardean Sale M.D.   On: 11/28/2021 14:28    Scheduled Meds:  allopurinol  100 mg Oral Daily   dicyclomine  20 mg Oral BID   heparin  5,000 Units Subcutaneous Q8H   hydrALAZINE  50 mg Oral BID   insulin aspart  0-5 Units Subcutaneous QHS   insulin aspart  0-9 Units Subcutaneous TID WC   metoprolol tartrate  100 mg Oral BID   sodium bicarbonate  650 mg Oral QID   vancomycin variable dose per unstable renal function (pharmacist dosing)   Does not apply See admin instructions   Continuous Infusions:  cefTRIAXone (ROCEPHIN)  IV     lactated ringers 50 mL/hr at  11/29/21 0850   [START ON 11/30/2021] vancomycin       LOS: 1 day   Time spent: 36 mins   Willet Schleifer Wynetta Emery, MD How to contact the [facility] Attending or Consulting provider [place] or covering provider during after hours [place], for this patient?  Check the care team in [facility] and look for a) attending/consulting [facility] provider listed and b) the [facility] team listed Log into [email or web address] and use [place]'s universal password to access. If you do not have the password, please contact the hospital operator. Locate the [facility] provider you are looking for under [facility] and page to a number that you can be directly reached. If you still have difficulty reaching the provider, please page the [facility] (Director on Call) for the Hospitalists listed on amion for assistance.  11/29/2021, 1:22 PM

## 2021-11-29 NOTE — Evaluation (Signed)
Physical Therapy Evaluation Patient Details Name: Heather [name] MRN: [id number] DOB: May 31, 1951 Today's Date: 11/29/2021  History of Present Illness  Heather [name] is a 71 y.o. female with medical history significant for diabetes mellitus, hypertension, CKD 3, memory problems.  Patient brought to the ED with reports of multiple falls.  Friday 1/28, patient was diagnosed with comminuted fracture of the right humeral head and right shoulder dislocation.  2 days ago Sunday, patient fell again hitting her head and neck.  Yesterday Monday, patient fell on her right hip.  Patient was brought to the ED reports that her right hip did not look normal.  Family is also worried that patient's memory and gait is getting worse,.  Family also reported patient's IV infiltrated during her last hospitalization, she was prescribed bacitracin ointment and Keflex for the infection.  The area now has an ulcer and is looking worse with current redness.   Clinical Impression  Patient demonstrates slow labored movement for sitting up at bedside with c/o severe pain RUE and right hip, able to stand with Mod bilateral hand held assist to support patient  or using right hand to hold onto RW and limited to a couple of slow labored side steps before requesting to sit due to increasing right hip pain.  Patient required Max assist to reposition when put back to bed.  Patient will benefit from continued skilled physical therapy in hospital and recommended venue below to increase strength, balance, endurance for safe ADLs and gait.         Recommendations for follow up therapy are one component of a multi-disciplinary discharge planning process, led by the attending physician.  Recommendations may be updated based on patient status, additional functional criteria and insurance authorization.  Follow Up Recommendations Skilled nursing-short term rehab (<3 hours/day)    Assistance Recommended at Discharge Intermittent  Supervision/Assistance  Patient can return home with the following  A lot of help with bathing/dressing/bathroom;A lot of help with walking and/or transfers;Help with stairs or ramp for entrance;Assistance with cooking/housework;Two people to help with bathing/dressing/bathroom    Equipment Recommendations Cane (small based quad cane)  Recommendations for Other Services       Functional Status Assessment Patient has had a recent decline in their functional status and demonstrates the ability to make significant improvements in function in a reasonable and predictable amount of time.     Precautions / Restrictions Precautions Precautions: Fall Required Braces or Orthoses: Sling (RUE) Restrictions Weight Bearing Restrictions: Yes RUE Weight Bearing: Non weight bearing      Mobility  Bed Mobility Overal bed mobility: Needs Assistance Bed Mobility: Supine to Sit, Sit to Supine     Supine to sit: Max assist Sit to supine: Max assist   General bed mobility comments: increased time, labored movement, c/o severe pain right hip with movement    Transfers Overall transfer level: Needs assistance Equipment used: Rolling walker (2 wheels), 1 person hand held assist Transfers: Sit to/from Stand Sit to Stand: Mod assist           General transfer comment: Mod assist supported with bilateral hand held assist or using left hand to hold onto RW    Ambulation/Gait Ambulation/Gait assistance: Max assist Gait Distance (Feet): 2 Feet Assistive device: Rolling walker (2 wheels), 1 person hand held assist Gait Pattern/deviations: Decreased step length - right, Decreased step length - left, Decreased stance time - right, Decreased stride length, Antalgic Gait velocity: slow     General Gait Details:  limited to a couple of slow labored side steps with difficulty advancing RLE due to c/o severe hip pain using RW or bilateral hand held assist  Stairs            Wheelchair  Mobility    Modified Rankin (Stroke Patients Only)       Balance Overall balance assessment: Needs assistance Sitting-balance support: Feet supported, No upper extremity supported Sitting balance-Leahy Scale: Fair Sitting balance - Comments: seated at EOB   Standing balance support: During functional activity, Single extremity supported Standing balance-Leahy Scale: Poor Standing balance comment: using RW or bilateral hand held assist                             Pertinent Vitals/Pain Pain Assessment Pain Assessment: Faces Faces Pain Scale: Hurts whole lot Pain Location: RUE and right hip Pain Descriptors / Indicators: Sore, Grimacing, Guarding, Moaning Pain Intervention(s): Limited activity within patient's tolerance, Monitored during session, Repositioned    Home Living Family/patient expects to be discharged to:: Private residence Living Arrangements: Spouse/significant other Available Help at Discharge: Family;Available 24 hours/day Type of Home: House Home Access: Ramped entrance       Home Layout: One level Home Equipment: Rollator (4 wheels);Crutches;Tub bench;Grab bars - tub/shower;Rolling Walker (2 wheels);Cane - quad      Prior Function Prior Level of Function : Needs assist       Physical Assist : Mobility (physical);ADLs (physical) Mobility (physical): Gait;Bed mobility;Transfers;Stairs   Mobility Comments: Assisted transfers and very short household distances using [facility] ADLs Comments: assisted by family     Hand Dominance   Dominant Hand: Right    Extremity/Trunk Assessment   Upper Extremity Assessment Upper Extremity Assessment: Generalized weakness;RUE deficits/detail RUE Deficits / Details: grossly 2+/5 RUE: Unable to fully assess due to pain;Unable to fully assess due to immobilization    Lower Extremity Assessment Lower Extremity Assessment: Generalized weakness;RLE deficits/detail RLE Deficits / Details: grossly 3+/5 RLE:  Unable to fully assess due to pain RLE Sensation: WNL RLE Coordination: WNL    Cervical / Trunk Assessment Cervical / Trunk Assessment: Kyphotic  Communication   Communication: No difficulties  Cognition Arousal/Alertness: Awake/alert Behavior During Therapy: WFL for tasks assessed/performed Overall Cognitive Status: History of cognitive impairments - at baseline                                          General Comments      Exercises     Assessment/Plan    PT Assessment Patient needs continued PT services  PT Problem List Decreased strength;Decreased activity tolerance;Decreased balance;Decreased mobility       PT Treatment Interventions DME instruction;Gait training;Stair training;Functional mobility training;Therapeutic activities;Therapeutic exercise;Patient/family education;Balance training    PT Goals (Current goals can be found in the Care Plan section)  Acute Rehab PT Goals Patient Stated Goal: return home PT Goal Formulation: With patient/family Time For Goal Achievement: 12/13/21 Potential to Achieve Goals: Good    Frequency Min 3X/week     Co-evaluation               AM-PAC PT "6 Clicks" Mobility  Outcome Measure Help needed turning from your back to your side while in a flat bed without using bedrails?: A Lot Help needed moving from lying on your back to sitting on the side of a flat bed without using  bedrails?: A Lot Help needed moving to and from a bed to a chair (including a wheelchair)?: A Lot Help needed standing up from a chair using your arms (e.g., wheelchair or bedside chair)?: A Lot Help needed to walk in hospital room?: A Lot Help needed climbing 3-5 steps with a railing? : Total 6 Click Score: 11    End of Session   Activity Tolerance: Patient tolerated treatment well;Patient limited by fatigue;Patient limited by pain Patient left: in bed;with call bell/phone within reach;with family/visitor present Nurse  Communication: Mobility status PT Visit Diagnosis: Unsteadiness on feet (R26.81);Other abnormalities of gait and mobility (R26.89);Muscle weakness (generalized) (M62.81)    Time: 9326-7124 PT Time Calculation (min) (ACUTE ONLY): 30 min   Charges:   PT Evaluation $PT Eval Moderate Complexity: 1 Mod          2:31 PM, 11/29/21 Lonell Grandchild, MPT Physical Therapist with [facility] [phone] office [phone] mobile phone

## 2021-11-29 NOTE — Consult Note (Addendum)
[name]  Patient ID: Heather [name], female   DOB: [birth year], 71 y.o.   MRN: [id number] Consult in hospital [facility] Requested by Dr. Irwin Brakeman Reason for consultation right proximal humerus fracture  I recommend nonoperative treatment based on the history and physical below  Chief Complaint  Patient presents with   Fall    [facility] dt multiple falls since Sunday, brain tumor effecting balance & causing seizures x 6mos, pt c/o R hip pain, pt states she hit her head on Sunday, scab noted above R eyebrow, no blood thinners     HPI Heather [name] is a 71 y.o. female.     Injury date was January 28 patient was admitted to the hospital on 31 January after falling again  Records indicate the following Chief Complaint  Patient presents with   Arm Injury      Right shoulder/upper arm      Heather [name] is a 71 y.o. female who presents to the ED today with complaint of sudden onset, constant, sharp, R shoulder pain s/p mechanical fall that occurred last night.  Patient reports that she lost her footing last night and fell landing onto the right shoulder.  She states that she has been having pain since that time.  She had a shoulder sling from prior rotator cuff surgery which she has been using.  Denies any head injury or loss of consciousness with the fall.  She does endorse that she went home from rehab yesterday prior to falling. No other complaints at this time.    The history is provided by the patient, the spouse and medical records.   I spoke to them patient spouse to relatives and they agree that this is what happened  Review of Systems (at least 2) ROS Could not assess based on acuity of condition patient is falling a lot.  Pain right shoulder pain right hip also noted from records   has a past medical history of Acute renal failure superimposed on stage 3 chronic kidney disease (HCC) (01/21/2016), Anemia, Anxiety, B12 deficiency,  Brain tumor (benign) (HCC) (10/29/2020), Cellulitis and abscess, CKD (chronic kidney disease) stage 3, GFR 30-59 ml/min (HCC), Closed fracture of distal end of femur, unspecified fracture morphology, initial encounter (HCC), Coronary arteriosclerosis (07/15/2018), Crohn disease (HCC), Depression, Diabetic neuropathy (HCC), DJD (degenerative joint disease), Essential hypertension, Femur fracture, right (HCC) (03/30/2017), Folliculitis, GERD (gastroesophageal reflux disease), Gout, Headache(784.0), Hiatal hernia, History of blood transfusion, History of bronchitis, History of cardiac catheterization, History of kidney stones, Insomnia, Irritable bowel syndrome, Mixed hyperlipidemia, Osteoarthritis, Palpitations, Pneumonia, Reflux esophagitis, Salmonella, Stroke (HCC), Tinnitus, and Type 2 diabetes mellitus (HCC).    Past Surgical History:  Procedure Laterality Date   BACK SURGERY     c-spine surgery     06 /2008   CARDIAC CATHETERIZATION     CHOLECYSTECTOMY     COLONOSCOPY  04/21/2011; 05/29/11   6/12: [place] - ?AVM at IC valve, inflammatory changes at IC valve [facility]); 7/12 - [facility]; IC valve erosions, look chronic and probably Crohn's per path   colonoscopy  2017   [place]: random biopsies normal. TI normal   ESOPHAGOGASTRODUODENOSCOPY  05/29/11   normal   ESOPHAGOGASTRODUODENOSCOPY N/A 01/21/2016   Dr. Michail Sermon: normal   EYE SURGERY Bilateral    cataracts removed   FEMUR IM NAIL Right 04/10/2018   Procedure: INTRAMEDULLARY (IM) NAIL FEMORAL;  Surgeon: Paralee Cancel, MD;  Location: [facility];  Service: Orthopedics;  Laterality: Right;   FRACTURE SURGERY  GIVENS CAPSULE STUDY  11/17/2020   HARDWARE REMOVAL Right 04/10/2018   Procedure: HARDWARE REMOVAL RIGHT DISTAL FEMUR;  Surgeon: Paralee Cancel, MD;  Location: [facility];  Service: Orthopedics;  Laterality: Right;   HERNIA REPAIR     IR FLUORO GUIDE CV LINE RIGHT  04/01/2017   IR US GUIDE VASC ACCESS RIGHT  04/01/2017   JOINT  REPLACEMENT Right    hip   OPEN REDUCTION INTERNAL FIXATION (ORIF) DISTAL RADIAL FRACTURE Right 05/28/2019   Procedure: OPEN REDUCTION INTERNAL FIXATION (ORIF) DISTAL RADIAL FRACTURE;  Surgeon: Verner Mould, MD;  Location: [place];  Service: Orthopedics;  Laterality: Right;  with block 90 minutes   ORIF FEMUR FRACTURE Right 03/31/2017   Procedure: OPEN REDUCTION INTERNAL FIXATION (ORIF) DISTAL FEMUR FRACTURE;  Surgeon: Paralee Cancel, MD;  Location: [place];  Service: Orthopedics;  Laterality: Right;   removal of kidney stone     Right knee arthroscopic surgery     TONSILLECTOMY     TOTAL ABDOMINAL HYSTERECTOMY       Family History  Problem Relation Age of Onset   Diabetes Mother    Colon cancer Brother        POSSIBLY. Patient states diagnosed at age 5, then later states age 35. unclear and limited historian   Esophageal cancer Neg Hx    Rectal cancer Neg Hx    Stomach cancer Neg Hx     Social History   Tobacco Use   Smoking status: Never   Smokeless tobacco: Never  Vaping Use   Vaping Use: Never used  Substance Use Topics   Alcohol use: No   Drug use: No    Allergies  Allergen Reactions   Losartan Swelling, Rash and Nausea And Vomiting   Quinolones Swelling, Nausea Only and Nausea And Vomiting    Swelling of face, jaw, and lips Swelling of face, jaw, and lips   Duloxetine Hcl Swelling   Fluocinolone Swelling   Sulfa Antibiotics Swelling and Nausea And Vomiting    headache Swelling of feet, legs Swelling of feet, legs   Acetaminophen Itching and Swelling    Pt states "some swelling" Pt states "some swelling" Pt states "some swelling" Pt states "some swelling" Pt states "some swelling"   Amlodipine Besylate Swelling    Swelling of feet, fluid retention   Amlodipine Besylate Swelling   Ciprofloxacin Nausea And Vomiting and Swelling    Swelling of face, jaw, and lips   Duloxetine Swelling   Hydrocodone-Acetaminophen Itching   Hydrocodone-Acetaminophen  Itching and Swelling   Ketamine Other (See Comments)   Metformin Other (See Comments)    REACTION: GI UPSET Other reaction(s): Stomach   Nsaids Other (See Comments)    "HAS BLEEDING ULCERS"   Sulfamethoxazole Swelling    Swelling of feet, legs   Valsartan Swelling   Lisinopril Swelling and Rash    Oral swelling, and red streaks on arms/stomach   Tape Itching and Rash    Paper tape can only be used on this patient     Current Facility-Administered Medications:    allopurinol (ZYLOPRIM) tablet 100 mg, 100 mg, Oral, Daily, Emokpae, Ejiroghene E, MD   cefTRIAXone (ROCEPHIN) 2 g in sodium chloride 0.9 % 100 mL IVPB, 2 g, Intravenous, Q24H, Emokpae, Ejiroghene E, MD   dicyclomine (BENTYL) capsule 20 mg, 20 mg, Oral, BID, Johnson, Clanford L, MD   heparin injection 5,000 Units, 5,000 Units, Subcutaneous, Q8H, Emokpae, Ejiroghene E, MD, 5,000 Units at 11/29/21 0507   hydrALAZINE (APRESOLINE) tablet  50 mg, 50 mg, Oral, BID, Emokpae, Ejiroghene E, MD, 50 mg at 11/28/21 2245   HYDROmorphone (DILAUDID) injection 0.5 mg, 0.5 mg, Intravenous, Q4H PRN, Emokpae, Ejiroghene E, MD, 0.5 mg at 11/29/21 0852   insulin aspart (novoLOG) injection 0-5 Units, 0-5 Units, Subcutaneous, QHS, Emokpae, Ejiroghene E, MD   insulin aspart (novoLOG) injection 0-9 Units, 0-9 Units, Subcutaneous, TID WC, Emokpae, Ejiroghene E, MD, 1 Units at 11/29/21 0850   labetalol (NORMODYNE) injection 10 mg, 10 mg, Intravenous, Q2H PRN, Emokpae, Ejiroghene E, MD   lactated ringers infusion, , Intravenous, Continuous, Johnson, Clanford L, MD, Last Rate: 50 mL/hr at 11/29/21 0850, Bolus from Bag at 11/29/21 0850   metoprolol tartrate (LOPRESSOR) tablet 100 mg, 100 mg, Oral, BID, Emokpae, Ejiroghene E, MD, 100 mg at 11/28/21 2244   ondansetron (ZOFRAN) tablet 4 mg, 4 mg, Oral, Q6H PRN **OR** ondansetron (ZOFRAN) injection 4 mg, 4 mg, Intravenous, Q6H PRN, Emokpae, Ejiroghene E, MD   polyethylene glycol (MIRALAX / GLYCOLAX) packet 17 g,  17 g, Oral, Daily PRN, Emokpae, Ejiroghene E, MD   sodium bicarbonate tablet 650 mg, 650 mg, Oral, QID, Emokpae, Ejiroghene E, MD, 650 mg at 11/28/21 2243   temazepam (RESTORIL) capsule 15 mg, 15 mg, Oral, QHS PRN, Emokpae, Ejiroghene E, MD   [START ON 11/30/2021] vancomycin (VANCOREADY) IVPB 750 mg/150 mL, 750 mg, Intravenous, Q48H, Johnson, Clanford L, MD   vancomycin variable dose per unstable renal function (pharmacist dosing), , Does not apply, See admin instructions, Emokpae, Ejiroghene E, MD     Physical Exam-Detailed Physical Exam  BP (!) 157/79    Pulse (!) 118    Temp 98.8 F (37.1 C)    Resp 18    Ht 5\' 3"  (1.6 m)    Wt 73.4 kg    LMP  (LMP Unknown)    SpO2 99%    BMI 28.66 kg/m  Body mass index is 28.66 kg/m.  Gen. appearance normal appearance grooming and hygiene Cardiovascular exam the pulses are 2+ with no peripheral edema  The ambulatory status is ambulatory recent hip fracture IM nailing  Extremity examined right upper Sling noted tender proximal humerus swelling mild no significant ecchymosis motor exam deferred joint range of motion exam deferred and stability test deferred of the shoulder hand and wrist look normal  I Sensation to touch is normal The patient is oriented to person place and time The patient's mood and affect show no depression or anxiety or agitation  Opposite extremity :   Left upper Inspection left hand cellulitis dorsal eschar mild edema Range of motion assessment slight decrease in range of motion of the hand  stability assessment stability test reveal no instability or laxity Muscle strength and muscle tone are normal with no atrophy or tremors Skin there is a scab on the dorsum of the hand with surrounding erythema    MEDICAL DECISION MAKING (minimum/low)  Data Reviewed  I have personally reviewed the imaging studies and the report and my interpretation is:  Plain films show a comminuted proximal humerus fracture  There is no  dislocation  CT scan shows again comminuted proximal humerus fracture without dislocation  Assessment and Plan  71 year old female with comminuted proximal humerus fracture.  The patient is in no medical condition to undergo reconstructive surgery of such fracture  Recent long-term detailed review of proximal humerus fractures in this age group with this medical condition support nonoperative treatment which is what were going to recommend.  The patient's follow-up will with Dr.  Cairns in 2 weeks for x-ray  Arther Abbott 11/29/2021, 12:19 PM   Carole Civil MD

## 2021-11-29 NOTE — Progress Notes (Signed)
Patient has had some confusion today but is alert and oriented to self and place. Patient has continuously complained of pain in her right arm, right hip and neck, given dilaudid for pain. Family has been at bedside for majority of the day.

## 2021-11-30 DIAGNOSIS — S42209A Unspecified fracture of upper end of unspecified humerus, initial encounter for closed fracture: Secondary | ICD-10-CM | POA: Diagnosis present

## 2021-11-30 DIAGNOSIS — L03119 Cellulitis of unspecified part of limb: Secondary | ICD-10-CM

## 2021-11-30 LAB — CBC
HCT: 23.3 % — ABNORMAL LOW (ref 36.0–46.0)
Hemoglobin: 7.1 g/dL — ABNORMAL LOW (ref 12.0–15.0)
MCH: 31.4 pg (ref 26.0–34.0)
MCHC: 30.5 g/dL (ref 30.0–36.0)
MCV: 103.1 fL — ABNORMAL HIGH (ref 80.0–100.0)
Platelets: 191 10*3/uL (ref 150–400)
RBC: 2.26 MIL/uL — ABNORMAL LOW (ref 3.87–5.11)
RDW: 15.2 % (ref 11.5–15.5)
WBC: 5.1 10*3/uL (ref 4.0–10.5)
nRBC: 0 % (ref 0.0–0.2)

## 2021-11-30 LAB — RESP PANEL BY RT-PCR (FLU A&B, COVID) ARPGX2
Influenza A by PCR: NEGATIVE
Influenza B by PCR: NEGATIVE
SARS Coronavirus 2 by RT PCR: NEGATIVE

## 2021-11-30 LAB — RENAL FUNCTION PANEL
Albumin: 2.4 g/dL — ABNORMAL LOW (ref 3.5–5.0)
Anion gap: 14 (ref 5–15)
BUN: 53 mg/dL — ABNORMAL HIGH (ref 8–23)
CO2: 18 mmol/L — ABNORMAL LOW (ref 22–32)
Calcium: 8 mg/dL — ABNORMAL LOW (ref 8.9–10.3)
Chloride: 106 mmol/L (ref 98–111)
Creatinine, Ser: 2.73 mg/dL — ABNORMAL HIGH (ref 0.44–1.00)
GFR, Estimated: 18 mL/min — ABNORMAL LOW (ref >60)
Glucose, Bld: 166 mg/dL — ABNORMAL HIGH (ref 70–99)
Phosphorus: 4.3 mg/dL (ref 2.5–4.6)
Potassium: 4.5 mmol/L (ref 3.5–5.1)
Sodium: 138 mmol/L (ref 135–145)

## 2021-11-30 LAB — GLUCOSE, CAPILLARY
Glucose-Capillary: 148 mg/dL — ABNORMAL HIGH (ref 70–99)
Glucose-Capillary: 146 mg/dL — ABNORMAL HIGH (ref 70–99)
Glucose-Capillary: 164 mg/dL — ABNORMAL HIGH (ref 70–99)
Glucose-Capillary: 207 mg/dL — ABNORMAL HIGH (ref 70–99)

## 2021-11-30 LAB — PREPARE RBC (CROSSMATCH)

## 2021-11-30 LAB — URINE CULTURE: Culture: NO GROWTH

## 2021-11-30 LAB — HEMOGLOBIN AND HEMATOCRIT, BLOOD
HCT: 24.7 % — ABNORMAL LOW (ref 36.0–46.0)
Hemoglobin: 8 g/dL — ABNORMAL LOW (ref 12.0–15.0)

## 2021-11-30 LAB — MAGNESIUM: Magnesium: 1.1 mg/dL — ABNORMAL LOW (ref 1.7–2.4)

## 2021-11-30 MED ORDER — SODIUM CHLORIDE 0.9% IV SOLUTION: Freq: Once | INTRAVENOUS | Status: AC

## 2021-11-30 MED ORDER — FUROSEMIDE 10 MG/ML IJ SOLN
20.0000 mg | Freq: Once | INTRAMUSCULAR | Status: AC
  Administered 2021-11-30: 20 mg via INTRAVENOUS
  Filled 2021-11-30: qty 2

## 2021-11-30 MED ORDER — HYDROMORPHONE HCL 1 MG/ML IJ SOLN
0.5000 mg | INTRAMUSCULAR | Status: DC | PRN
  Administered 2021-11-30 – 2021-12-01 (×3): 0.5 mg via INTRAVENOUS
  Filled 2021-11-30 (×3): qty 0.5

## 2021-11-30 MED ORDER — POLYETHYLENE GLYCOL 3350 17 G PO PACK
17.0000 g | PACK | Freq: Every day | ORAL | Status: DC
  Administered 2021-12-01: 17 g via ORAL
  Filled 2021-11-30: qty 1

## 2021-11-30 MED ORDER — ACETAMINOPHEN 325 MG PO TABS
650.0000 mg | ORAL_TABLET | ORAL | Status: DC
  Administered 2021-11-30 – 2021-12-01 (×7): 650 mg via ORAL
  Filled 2021-11-30 (×8): qty 2

## 2021-11-30 MED ORDER — CEFAZOLIN SODIUM-DEXTROSE 1-4 GM/50ML-% IV SOLN
1.0000 g | Freq: Two times a day (BID) | INTRAVENOUS | Status: DC
  Administered 2021-11-30 – 2021-12-01 (×2): 1 g via INTRAVENOUS
  Filled 2021-11-30 (×5): qty 50

## 2021-11-30 MED ORDER — OXYCODONE HCL 5 MG PO TABS: 5.0000 mg | ORAL_TABLET | ORAL | Status: DC | PRN

## 2021-11-30 MED ORDER — OXYCODONE HCL 5 MG PO TABS
5.0000 mg | ORAL_TABLET | Freq: Four times a day (QID) | ORAL | Status: DC | PRN
  Administered 2021-11-30: 7.5 mg via ORAL
  Administered 2021-12-01: 5 mg via ORAL
  Filled 2021-11-30: qty 1
  Filled 2021-11-30: qty 2

## 2021-11-30 MED ORDER — MAGNESIUM SULFATE 4 GM/100ML IV SOLN
4.0000 g | Freq: Once | INTRAVENOUS | Status: AC
Start: 2021-11-30 — End: 2021-11-30
  Administered 2021-11-30: 4 g via INTRAVENOUS
  Filled 2021-11-30: qty 100

## 2021-11-30 NOTE — Assessment & Plan Note (Signed)
-   secondary to cellulitis  - improving with aggressive treatments - continue IV antibiotics - follow culture and sensitivities

## 2021-11-30 NOTE — Assessment & Plan Note (Signed)
-   continue sling immobilization and pain management  - consult to orthopedics - Dr. [name] evaluated patient.  Plan is to manage nonoperatively.  - continue in sling immobilizer.   - pain management changes, schedule tylenol and add oral oxy IR, IV dilaudid for severe pain only

## 2021-11-30 NOTE — Assessment & Plan Note (Signed)
-   pt reported started from IV infiltration from previous hospitalization  - IV antibiotics, follow cultures - consult to orthopedics

## 2021-11-30 NOTE — TOC Progression Note (Signed)
Transition of Care [facility]) - Progression Note    Patient Details  Name: Heather [name] MRN: [id number] Date of Birth: 02-26-1951  Transition of Care [facility] CM/SW Contact  Shade Flood, LCSW Phone Number: 11/30/2021, 1:00 PM  Clinical Narrative:     TOC following. Confirmed with Merrilyn Puma at [facility] that they will accept pt at dc. They are aware of likely dc tomorrow and will start insurance auth today. Per Merrilyn Puma, pt will need a covid test within 24-48 hours of dc. Updated MD and RN.  Attempted to update pt's husband but no answer and no voicemail option. TOC will follow.  Expected Discharge Plan: Skilled Nursing Facility Barriers to Discharge: Continued Medical Work up  Expected Discharge Plan and Services Expected Discharge Plan: [place] In-house Referral: Clinical Social Work   Post Acute Care Choice: [place] Living arrangements for the past 2 months: Single Family Home                                       Social Determinants of Health (SDOH) Interventions    Readmission Risk Interventions Readmission Risk Prevention Plan 11/29/2021 08/21/2021 01/09/2021  Transportation Screening Complete Complete Complete  PCP or Specialist Appt within 3-5 Days - - Complete  HRI or Home Care Consult Complete - Complete  Social Work Consult for [place] Planning/Counseling Complete - Complete  Palliative Care Screening Not Applicable - Not Applicable  Medication Review Press photographer) Complete Complete Complete  HRI or Home Care Consult - Complete -  SW Recovery Care/Counseling Consult - Complete -  Palliative Care Screening - Not Applicable -  [place] - Not Applicable -  Some recent data might be hidden

## 2021-11-30 NOTE — Assessment & Plan Note (Signed)
-   stable, well controlled  - follow

## 2021-11-30 NOTE — Assessment & Plan Note (Signed)
-   continue SSI coverage and CBG monitoring

## 2021-11-30 NOTE — Assessment & Plan Note (Signed)
-   no active symptoms at this time.  Follow.

## 2021-11-30 NOTE — Progress Notes (Signed)
Progress Note   Patient: Heather [name] QHU:[id number] DOB: 1951-02-02 DOA: 11/28/2021     2 DOS: the patient was seen and examined on 11/30/2021   Brief hospital course:  71 y.o. female with medical history significant for diabetes mellitus, hypertension, CKD 3, memory problems. Patient brought to the ED with reports of multiple falls.  Friday 1/28, patient was diagnosed with comminuted fracture of the right humeral head and right shoulder dislocation.  2 days ago Sunday, patient fell again hitting her head and neck.  Yesterday Monday, patient fell on her right hip.  Patient was brought to the ED reports that her right hip did not look normal.  Family is also worried that patient's memory and gait is getting worse,. Family also reported patient's IV infiltrated during her last hospitalization, she was prescribed bacitracin ointment and Keflex for the infection.  The area now has an ulcer and is looking worse with current redness.   Patient was seen in the ED, 1/28, she was to follow-up with [name] for evaluation of her shoulder fracture.   Febrile to 101.5.  Tachycardic heart rate 116-132.  Respiratory rate 13-26.  Blood pressure systolic 465K to 812X.  O2 sats greater than 96% on room air. WBC 8.9.  Creatinine 3.87.  Lactic acid 1. Shoulder x-ray redemonstrates comminuted and displaced fracture of the right humeral head Female pelvic x-ray without acute abnormality. Right hip CT without acute abnormality, but shows a 6 x 4 x 3 cm hematoma. Head and cervical CT without acute abnormality. IV Vanco and ceftriaxone started.    Assessment and Plan: * Sepsis ([place])- (present on admission) - secondary to cellulitis  - improving with aggressive treatments - continue IV antibiotics - follow culture and sensitivities  Closed fracture of part of upper end of humerus- (present on admission) - continue sling immobilization and pain management  - consult to orthopedics - Dr. [name]  evaluated patient.  Plan is to manage nonoperatively.  - continue in sling immobilizer.   - pain management changes, schedule tylenol and add oral oxy IR, IV dilaudid for severe pain only   Cellulitis of left hand - pt reported started from IV infiltration from previous hospitalization  - IV antibiotics, follow cultures - consult to orthopedics  Chronic kidney disease (CKD), stage IV (severe) ([place])- (present on admission) - continue outpatient nephrology follow up - renally dose medications    Type 2 diabetes with nephropathy ([place])- (present on admission) - continue SSI coverage and CBG monitoring   Essential hypertension- (present on admission) - stable, well controlled  - follow   Crohn's disease of small intestine with other complication ([place])- (present on admission) - no active symptoms at this time.  Follow.       Subjective: complains of ongoing right shoulder pain and discomfort.   Physical Exam: Vitals:   11/30/21 1214 11/30/21 1231 11/30/21 1322 11/30/21 1430  BP: (!) 136/52 (!) 157/59 (!) 136/52 (!) 141/61  Pulse: 90 99 95 93  Resp: 14 14 20 18   Temp: (!) 97.3 F (36.3 C) 97.8 F (36.6 C) 99.7 F (37.6 C) 98 F (36.7 C)  TempSrc: Oral  Oral Oral  SpO2: 98%  98% 99%  Weight:      Height:       General exam: Appears calm and comfortable  Respiratory system: Clear to auscultation. Respiratory effort normal. Cardiovascular system: normal S1 & S2 heard. No JVD, murmurs, rubs, gallops or clicks. No pedal edema. Gastrointestinal system: Abdomen is nondistended, soft  and nontender. No organomegaly or masses felt. Normal bowel sounds heard. Central nervous system: Alert and oriented. No focal neurological deficits. Extremities: right arm in sling.  Left hand with large ventral circular eschar with surrounding erythema, tender to palpation c/w cellulitis.   Skin: No rashes, lesions or ulcers Psychiatry: Judgement and insight appear poor. Mood & affect flat.  Data  Reviewed:    Family Communication: telephone call update  Disposition: Status is: Inpatient Remains inpatient appropriate because: IV antibiotics required       Time spent: 35 minutes  Author: Irwin Brakeman, MD 11/30/2021 4:24 PM  For on call review [email or web address].

## 2021-11-30 NOTE — Hospital Course (Signed)
71 y.o. female with medical history significant for diabetes mellitus, hypertension, CKD 3, memory problems. Patient brought to the ED with reports of multiple falls.  Friday 1/28, patient was diagnosed with comminuted fracture of the right humeral head and right shoulder dislocation.  2 days ago Sunday, patient fell again hitting her head and neck.  Yesterday Monday, patient fell on her right hip.  Patient was brought to the ED reports that her right hip did not look normal.  Family is also worried that patient's memory and gait is getting worse,. Family also reported patient's IV infiltrated during her last hospitalization, she was prescribed bacitracin ointment and Keflex for the infection.  The area now has an ulcer and is looking worse with current redness.   Patient was seen in the ED, 1/28, she was to follow-up with [name] for evaluation of her shoulder fracture.   Febrile to 101.5.  Tachycardic heart rate 116-132.  Respiratory rate 13-26.  Blood pressure systolic 680H to 212Y.  O2 sats greater than 96% on room air. WBC 8.9.  Creatinine 3.87.  Lactic acid 1. Shoulder x-ray redemonstrates comminuted and displaced fracture of the right humeral head Female pelvic x-ray without acute abnormality. Right hip CT without acute abnormality, but shows a 6 x 4 x 3 cm hematoma. Head and cervical CT without acute abnormality. IV Vanco and ceftriaxone started.

## 2021-11-30 NOTE — Assessment & Plan Note (Signed)
-   continue outpatient nephrology follow up - renally dose medications

## 2021-12-01 DIAGNOSIS — I10 Essential (primary) hypertension: Secondary | ICD-10-CM

## 2021-12-01 DIAGNOSIS — E1121 Type 2 diabetes mellitus with diabetic nephropathy: Secondary | ICD-10-CM

## 2021-12-01 DIAGNOSIS — N184 Chronic kidney disease, stage 4 (severe): Secondary | ICD-10-CM

## 2021-12-01 DIAGNOSIS — L03119 Cellulitis of unspecified part of limb: Secondary | ICD-10-CM

## 2021-12-01 LAB — TYPE AND SCREEN
ABO/RH(D): O POS
Antibody Screen: NEGATIVE
Unit division: 0
Unit division: 0

## 2021-12-01 LAB — RENAL FUNCTION PANEL
Albumin: 2.4 g/dL — ABNORMAL LOW (ref 3.5–5.0)
Anion gap: 12 (ref 5–15)
BUN: 53 mg/dL — ABNORMAL HIGH (ref 8–23)
CO2: 19 mmol/L — ABNORMAL LOW (ref 22–32)
Calcium: 8.4 mg/dL — ABNORMAL LOW (ref 8.9–10.3)
Chloride: 106 mmol/L (ref 98–111)
Creatinine, Ser: 2.78 mg/dL — ABNORMAL HIGH (ref 0.44–1.00)
GFR, Estimated: 18 mL/min — ABNORMAL LOW (ref >60)
Glucose, Bld: 184 mg/dL — ABNORMAL HIGH (ref 70–99)
Phosphorus: 4.2 mg/dL (ref 2.5–4.6)
Potassium: 4.2 mmol/L (ref 3.5–5.1)
Sodium: 137 mmol/L (ref 135–145)

## 2021-12-01 LAB — BPAM RBC
Blood Product Expiration Date: 202303082359
Blood Product Expiration Date: 202303082359
ISSUE DATE / TIME: 202301311921
ISSUE DATE / TIME: 202302021209
Unit Type and Rh: 5100
Unit Type and Rh: 5100

## 2021-12-01 LAB — CBC
HCT: 25 % — ABNORMAL LOW (ref 36.0–46.0)
Hemoglobin: 7.8 g/dL — ABNORMAL LOW (ref 12.0–15.0)
MCH: 30.1 pg (ref 26.0–34.0)
MCHC: 31.2 g/dL (ref 30.0–36.0)
MCV: 96.5 fL (ref 80.0–100.0)
Platelets: 206 10*3/uL (ref 150–400)
RBC: 2.59 MIL/uL — ABNORMAL LOW (ref 3.87–5.11)
RDW: 21.3 % — ABNORMAL HIGH (ref 11.5–15.5)
WBC: 4.5 10*3/uL (ref 4.0–10.5)
nRBC: 0 % (ref 0.0–0.2)

## 2021-12-01 LAB — GLUCOSE, CAPILLARY
Glucose-Capillary: 165 mg/dL — ABNORMAL HIGH (ref 70–99)
Glucose-Capillary: 151 mg/dL — ABNORMAL HIGH (ref 70–99)

## 2021-12-01 LAB — MAGNESIUM: Magnesium: 1.9 mg/dL (ref 1.7–2.4)

## 2021-12-01 MED ORDER — DICYCLOMINE HCL 20 MG PO TABS: 20.0000 mg | ORAL_TABLET | Freq: Two times a day (BID) | ORAL | Status: DC

## 2021-12-01 MED ORDER — GABAPENTIN 300 MG PO CAPS: 300.0000 mg | ORAL_CAPSULE | Freq: Every day | ORAL | Status: DC

## 2021-12-01 MED ORDER — ONDANSETRON HCL 4 MG PO TABS: 4.0000 mg | ORAL_TABLET | Freq: Four times a day (QID) | ORAL | 0 refills | Status: DC | PRN

## 2021-12-01 MED ORDER — CEPHALEXIN 500 MG PO CAPS: 500.0000 mg | ORAL_CAPSULE | Freq: Two times a day (BID) | ORAL | 0 refills | Status: AC

## 2021-12-01 MED ORDER — DIPHENHYDRAMINE HCL 2 % EX CREA: TOPICAL_CREAM | Freq: Three times a day (TID) | CUTANEOUS | 0 refills | Status: DC | PRN

## 2021-12-01 MED ORDER — DIAZEPAM 5 MG PO TABS: 5.0000 mg | ORAL_TABLET | Freq: Two times a day (BID) | ORAL | 0 refills | Status: DC | PRN

## 2021-12-01 MED ORDER — CEPHALEXIN 500 MG PO CAPS: 500.0000 mg | ORAL_CAPSULE | Freq: Two times a day (BID) | ORAL | Status: DC

## 2021-12-01 MED ORDER — VENLAFAXINE HCL ER 75 MG PO CP24: 75.0000 mg | ORAL_CAPSULE | Freq: Every day | ORAL | Status: DC

## 2021-12-01 MED ORDER — FUROSEMIDE 20 MG PO TABS: 20.0000 mg | ORAL_TABLET | ORAL | Status: DC

## 2021-12-01 MED ORDER — ACETAMINOPHEN 325 MG PO TABS: 650.0000 mg | ORAL_TABLET | ORAL | Status: DC

## 2021-12-01 MED ORDER — OXYCODONE HCL 10 MG PO TABS: 10.0000 mg | ORAL_TABLET | Freq: Four times a day (QID) | ORAL | 0 refills | Status: AC | PRN

## 2021-12-01 MED ORDER — OXYCODONE HCL 5 MG PO TABS: 10.0000 mg | ORAL_TABLET | Freq: Four times a day (QID) | ORAL | Status: DC | PRN

## 2021-12-01 MED ORDER — POLYETHYLENE GLYCOL 3350 17 G PO PACK: 17.0000 g | PACK | Freq: Every day | ORAL | 0 refills | Status: DC

## 2021-12-01 MED ORDER — SACCHAROMYCES BOULARDII 250 MG PO CAPS: 250.0000 mg | ORAL_CAPSULE | Freq: Two times a day (BID) | ORAL | 0 refills | Status: AC

## 2021-12-01 NOTE — Discharge Summary (Signed)
Physician Discharge Summary  Heather [name] NTI:[id number] DOB: December 28, 1950 DOA: 11/28/2021  PCP: [name], MD Orthopedics: [name]   Admit date: 11/28/2021 Discharge date: 12/01/2021  Disposition:  [facility]   Recommendations for Outpatient Follow-up:  Follow up with [name] as scheduled on 12/13/21 at 10:40 AM  Please obtain BMP/CBC in 2 weeks to follow up  Keep right arm/shoulder immobilized in sling Please check blood sugars at least 3 times per day and treat per protocol  FALL PRECAUTIONS RECOMMENDED   Discharge Condition: STABLE   CODE STATUS: FULL DIET: HEART HEALTHY CARB MODIFIED    Brief Hospitalization Summary: Please see all hospital notes, images, labs for full details of the hospitalization. ADMISSION HPI:   71 y.o. female with medical history significant for diabetes mellitus, hypertension, CKD 3, memory problems.  Patient brought to the ED with reports of multiple falls.  Friday 1/28, patient was diagnosed with comminuted fracture of the right humeral head and right shoulder dislocation.  2 days ago Sunday, patient fell again hitting her head and neck.  Yesterday Monday, patient fell on her right hip.  Patient was brought to the ED reports that her right hip did not look normal.  Family is also worried that patient's memory and gait is getting worse.  Family also reported patient's IV infiltrated during her last hospitalization, she was prescribed bacitracin ointment and Keflex for the infection.  The area now has an ulcer and is looking worse with current redness.   Patient was seen in the ED, 1/28, she was to follow-up with Dr. Regino [name] for evaluation of her shoulder fracture.   Febrile to 101.5.  Tachycardic heart rate 116-132.  Respiratory rate 13-26.  Blood pressure systolic 867Y to 195K.  O2 sats greater than 96% on room air. WBC 8.9.  Creatinine 3.87.  Lactic acid 1. Shoulder x-ray redemonstrates comminuted and displaced fracture of the right humeral  head Female pelvic x-ray without acute abnormality. Right hip CT without acute abnormality, but shows a 6 x 4 x 3 cm hematoma. Head and cervical CT without acute abnormality. IV Vanco and ceftriaxone started.  HOSPITAL COURSE BY PROBLEM LIST  * Sepsis ([place])- (present on admission) - secondary to cellulitis  - improving with aggressive treatments - treated with IV antibiotics initially and transitioned to oral cephalexin    Closed fracture of part of upper end of humerus- (present on admission) - continue sling immobilization and pain management  - consult to orthopedics - Dr. [name] evaluated patient.  Plan is to manage nonoperatively.  - continue in sling immobilizer.   - pain management changes, schedule tylenol and add oral oxy IR, IV dilaudid for severe pain only  - Pt has appt with [name] on 12/13/21 for follow up xrays and wound check up.  - tylenol 650 every 4 hours plus oxycodone 10 mg every 6 hours PRN severe pain - please continue to titrate pain management regimen as needed for better pain control.   Cellulitis of left hand - pt reported this all started from IV infiltration from previous hospitalization at OSH  - IV antibiotics, follow cultures - consult to orthopedics - now completing oral antibiotics.  ID recommended cefazolin IV followed by oral cephalexin.    Chronic kidney disease (CKD), stage IV (severe) (HCC)- (present on admission) - continue outpatient nephrology follow up - renally dose medications     Type 2 diabetes with nephropathy ([place])- (present on admission) - continue SSI coverage and CBG monitoring   CBG (  last 3)  Recent Labs    11/30/21 2247 12/01/21 0735 12/01/21 1118  GLUCAP 207* 165* 151*    Essential hypertension- (present on admission) - stable, well controlled  - follow    Crohn's disease of small intestine with other complication ([place])- (present on admission) - no active symptoms at this time.  Follow.    Discharge  Diagnoses:  Principal Problem:   Sepsis ([place]) Active Problems:   Cellulitis of left hand   Closed fracture of part of upper end of humerus   Chronic kidney disease (CKD), stage IV (severe) (HCC)   Type 2 diabetes with nephropathy (HCC)   Essential hypertension   Acute kidney injury superimposed on CKD (HCC)   Depression   Crohn's disease of small intestine with other complication ([place])   Cerebral thrombosis with cerebral infarction   Acute on chronic anemia   Discharge Instructions:  Allergies as of 12/01/2021       Reactions   Losartan Swelling, Rash, Nausea And Vomiting   Quinolones Swelling, Nausea Only, Nausea And Vomiting   Swelling of face, jaw, and lips Swelling of face, jaw, and lips   Duloxetine Hcl Swelling   Fluocinolone Swelling   Sulfa Antibiotics Swelling, Nausea And Vomiting   headache Swelling of feet, legs Swelling of feet, legs   Acetaminophen Itching, Swelling   Pt states "some swelling" Pt states "some swelling" Pt states "some swelling" Pt states "some swelling" Pt states "some swelling"   Amlodipine Besylate Swelling   Swelling of feet, fluid retention   Amlodipine Besylate Swelling   Ciprofloxacin Nausea And Vomiting, Swelling   Swelling of face, jaw, and lips   Duloxetine Swelling   Hydrocodone-acetaminophen Itching   Hydrocodone-acetaminophen Itching, Swelling   Ketamine Other (See Comments)   Metformin Other (See Comments)   REACTION: GI UPSET Other reaction(s): Stomach   Nsaids Other (See Comments)   "HAS BLEEDING ULCERS"   Sulfamethoxazole Swelling   Swelling of feet, legs   Valsartan Swelling   Lisinopril Swelling, Rash   Oral swelling, and red streaks on arms/stomach   Tape Itching, Rash   Paper tape can only be used on this patient        Medication List     STOP taking these medications    bacitracin 500 UNIT/GM ointment   Cartia XT 180 MG 24 hr capsule Generic drug: diltiazem   citalopram 20 MG tablet Commonly  known as: CELEXA   diphenoxylate-atropine 2.5-0.025 MG tablet Commonly known as: Lomotil   Humira Pen 40 MG/0.4ML Pnkt Generic drug: Adalimumab   hydrALAZINE 25 MG tablet Commonly known as: APRESOLINE   hydrALAZINE 50 MG tablet Commonly known as: APRESOLINE   HYDROmorphone 2 MG tablet Commonly known as: Dilaudid   indomethacin 50 MG capsule Commonly known as: INDOCIN   insulin lispro 100 UNIT/ML KwikPen Commonly known as: HumaLOG KwikPen   labetalol 200 MG tablet Commonly known as: NORMODYNE   Levemir FlexTouch 100 UNIT/ML FlexPen Generic drug: insulin detemir   lipase/protease/amylase 36000 UNITS Cpep capsule Commonly known as: CREON   oxyCODONE-acetaminophen 5-325 MG tablet Commonly known as: PERCOCET/ROXICET   promethazine 25 MG tablet Commonly known as: PHENERGAN   Semaglutide-Weight Management 0.25 MG/0.5ML Soaj   SPS 15 GM/60ML suspension Generic drug: sodium polystyrene   tiZANidine 4 MG tablet Commonly known as: ZANAFLEX   valsartan 40 MG tablet Commonly known as: DIOVAN       TAKE these medications    acetaminophen 325 MG tablet Commonly known as: TYLENOL Take 2  tablets (650 mg total) by mouth every 4 (four) hours.   albuterol 108 (90 Base) MCG/ACT inhaler Commonly known as: VENTOLIN HFA Inhale 1-2 puffs into the lungs every 6 (six) hours as needed for shortness of breath or wheezing.   allopurinol 100 MG tablet Commonly known as: ZYLOPRIM Take 100 mg by mouth daily.   calcitRIOL 0.25 MCG capsule Commonly known as: ROCALTROL Take 0.25 mcg by mouth 3 (three) times a week. Monday,Wed and Fri.   calcium carbonate 1250 (500 Ca) MG chewable tablet Commonly known as: OS-CAL Chew 1 tablet by mouth 2 (two) times daily.   cephALEXin 500 MG capsule Commonly known as: KEFLEX Take 1 capsule (500 mg total) by mouth 2 (two) times daily for 5 days. What changed: when to take this   cyanocobalamin 1000 MCG/ML injection Commonly known as:  (VITAMIN B-12) Inject 1,000 mcg into the muscle every 30 (thirty) days.   diazepam 5 MG tablet Commonly known as: VALIUM Take 1 tablet (5 mg total) by mouth every 12 (twelve) hours as needed for anxiety.   dicyclomine 20 MG tablet Commonly known as: BENTYL Take 1 tablet (20 mg total) by mouth in the morning and at bedtime.   diphenhydrAMINE 2 % cream Commonly known as: BENADRYL Apply topically 3 (three) times daily as needed for itching.   esomeprazole 40 MG capsule Commonly known as: NEXIUM Take 40 mg by mouth daily before breakfast.   fenofibrate 145 MG tablet Commonly known as: Tricor Take 1 tablet (145 mg total) by mouth daily.   fish oil-omega-3 fatty acids 1000 MG capsule Take 1 g by mouth in the morning and at bedtime.   furosemide 20 MG tablet Commonly known as: LASIX Take 1 tablet (20 mg total) by mouth every other day. Start taking on: December 04, 2021 What changed:  medication strength how much to take when to take this These instructions start on December 04, 2021. If you are unsure what to do until then, ask your doctor or other care provider.   gabapentin 300 MG capsule Commonly known as: NEURONTIN Take 1 capsule (300 mg total) by mouth at bedtime. What changed: when to take this   insulin glargine-yfgn 100 UNIT/ML injection Commonly known as: SEMGLEE Inject 10 Units into the skin at bedtime.   levETIRAcetam 500 MG tablet Commonly known as: KEPPRA Take 500 mg by mouth 2 (two) times daily.   metoprolol succinate 50 MG 24 hr tablet Commonly known as: TOPROL-XL Take 50 mg by mouth daily. Take with or immediately following a meal.   ondansetron 4 MG tablet Commonly known as: ZOFRAN Take 1 tablet (4 mg total) by mouth every 6 (six) hours as needed for nausea.   Oxycodone HCl 10 MG Tabs Take 1 tablet (10 mg total) by mouth every 6 (six) hours as needed for up to 5 days for severe pain.   polyethylene glycol 17 g packet Commonly known as: MIRALAX /  GLYCOLAX Take 17 g by mouth daily. Start taking on: December 02, 2021   Retacrit 3000 UNIT/ML injection Generic drug: epoetin alfa-epbx Inject 3,000 Units into the skin every 14 (fourteen) days.   saccharomyces boulardii 250 MG capsule Commonly known as: Florastor Take 1 capsule (250 mg total) by mouth 2 (two) times daily for 14 days.   sodium bicarbonate 650 MG tablet Take 650 mg by mouth 3 (three) times daily.   venlafaxine XR 75 MG 24 hr capsule Commonly known as: EFFEXOR-XR Take 1 capsule (75 mg total) by mouth  daily. What changed:  medication strength how much to take   vitamin C with rose hips 500 MG tablet Take 500 mg by mouth every morning.   Vitamin D 50 MCG (2000 UT) Caps Take 2,000 Units by mouth every morning.        Contact information for follow-up providers     Mordecai Rasmussen, MD. Go on 12/13/2021.   Specialties: Orthopedic Surgery, Sports Medicine Why: at 10:40 am as scheduled for xrays and shoulder follow up Contact information: [address] [place] [address] [phone]              Contact information for after-discharge care     [facility] .   Service: Skilled Nursing Contact information: [place] [address] [phone]                    Allergies  Allergen Reactions   Losartan Swelling, Rash and Nausea And Vomiting   Quinolones Swelling, Nausea Only and Nausea And Vomiting    Swelling of face, jaw, and lips Swelling of face, jaw, and lips   Duloxetine Hcl Swelling   Fluocinolone Swelling   Sulfa Antibiotics Swelling and Nausea And Vomiting    headache Swelling of feet, legs Swelling of feet, legs   Acetaminophen Itching and Swelling    Pt states "some swelling" Pt states "some swelling" Pt states "some swelling" Pt states "some swelling" Pt states "some swelling"   Amlodipine Besylate Swelling    Swelling of feet, fluid retention    Amlodipine Besylate Swelling   Ciprofloxacin Nausea And Vomiting and Swelling    Swelling of face, jaw, and lips   Duloxetine Swelling   Hydrocodone-Acetaminophen Itching   Hydrocodone-Acetaminophen Itching and Swelling   Ketamine Other (See Comments)   Metformin Other (See Comments)    REACTION: GI UPSET Other reaction(s): Stomach   Nsaids Other (See Comments)    "HAS BLEEDING ULCERS"   Sulfamethoxazole Swelling    Swelling of feet, legs   Valsartan Swelling   Lisinopril Swelling and Rash    Oral swelling, and red streaks on arms/stomach   Tape Itching and Rash    Paper tape can only be used on this patient   Allergies as of 12/01/2021       Reactions   Losartan Swelling, Rash, Nausea And Vomiting   Quinolones Swelling, Nausea Only, Nausea And Vomiting   Swelling of face, jaw, and lips Swelling of face, jaw, and lips   Duloxetine Hcl Swelling   Fluocinolone Swelling   Sulfa Antibiotics Swelling, Nausea And Vomiting   headache Swelling of feet, legs Swelling of feet, legs   Acetaminophen Itching, Swelling   Pt states "some swelling" Pt states "some swelling" Pt states "some swelling" Pt states "some swelling" Pt states "some swelling"   Amlodipine Besylate Swelling   Swelling of feet, fluid retention   Amlodipine Besylate Swelling   Ciprofloxacin Nausea And Vomiting, Swelling   Swelling of face, jaw, and lips   Duloxetine Swelling   Hydrocodone-acetaminophen Itching   Hydrocodone-acetaminophen Itching, Swelling   Ketamine Other (See Comments)   Metformin Other (See Comments)   REACTION: GI UPSET Other reaction(s): Stomach   Nsaids Other (See Comments)   "HAS BLEEDING ULCERS"   Sulfamethoxazole Swelling   Swelling of feet, legs   Valsartan Swelling   Lisinopril Swelling, Rash   Oral swelling, and red streaks on arms/stomach   Tape Itching, Rash  Paper tape can only be used on this patient        Medication List     STOP taking these medications     bacitracin 500 UNIT/GM ointment   Cartia XT 180 MG 24 hr capsule Generic drug: diltiazem   citalopram 20 MG tablet Commonly known as: CELEXA   diphenoxylate-atropine 2.5-0.025 MG tablet Commonly known as: Lomotil   Humira Pen 40 MG/0.4ML Pnkt Generic drug: Adalimumab   hydrALAZINE 25 MG tablet Commonly known as: APRESOLINE   hydrALAZINE 50 MG tablet Commonly known as: APRESOLINE   HYDROmorphone 2 MG tablet Commonly known as: Dilaudid   indomethacin 50 MG capsule Commonly known as: INDOCIN   insulin lispro 100 UNIT/ML KwikPen Commonly known as: HumaLOG KwikPen   labetalol 200 MG tablet Commonly known as: NORMODYNE   Levemir FlexTouch 100 UNIT/ML FlexPen Generic drug: insulin detemir   lipase/protease/amylase 36000 UNITS Cpep capsule Commonly known as: CREON   oxyCODONE-acetaminophen 5-325 MG tablet Commonly known as: PERCOCET/ROXICET   promethazine 25 MG tablet Commonly known as: PHENERGAN   Semaglutide-Weight Management 0.25 MG/0.5ML Soaj   SPS 15 GM/60ML suspension Generic drug: sodium polystyrene   tiZANidine 4 MG tablet Commonly known as: ZANAFLEX   valsartan 40 MG tablet Commonly known as: DIOVAN       TAKE these medications    acetaminophen 325 MG tablet Commonly known as: TYLENOL Take 2 tablets (650 mg total) by mouth every 4 (four) hours.   albuterol 108 (90 Base) MCG/ACT inhaler Commonly known as: VENTOLIN HFA Inhale 1-2 puffs into the lungs every 6 (six) hours as needed for shortness of breath or wheezing.   allopurinol 100 MG tablet Commonly known as: ZYLOPRIM Take 100 mg by mouth daily.   calcitRIOL 0.25 MCG capsule Commonly known as: ROCALTROL Take 0.25 mcg by mouth 3 (three) times a week. Monday,Wed and Fri.   calcium carbonate 1250 (500 Ca) MG chewable tablet Commonly known as: OS-CAL Chew 1 tablet by mouth 2 (two) times daily.   cephALEXin 500 MG capsule Commonly known as: KEFLEX Take 1 capsule (500 mg total) by  mouth 2 (two) times daily for 5 days. What changed: when to take this   cyanocobalamin 1000 MCG/ML injection Commonly known as: (VITAMIN B-12) Inject 1,000 mcg into the muscle every 30 (thirty) days.   diazepam 5 MG tablet Commonly known as: VALIUM Take 1 tablet (5 mg total) by mouth every 12 (twelve) hours as needed for anxiety.   dicyclomine 20 MG tablet Commonly known as: BENTYL Take 1 tablet (20 mg total) by mouth in the morning and at bedtime.   diphenhydrAMINE 2 % cream Commonly known as: BENADRYL Apply topically 3 (three) times daily as needed for itching.   esomeprazole 40 MG capsule Commonly known as: NEXIUM Take 40 mg by mouth daily before breakfast.   fenofibrate 145 MG tablet Commonly known as: Tricor Take 1 tablet (145 mg total) by mouth daily.   fish oil-omega-3 fatty acids 1000 MG capsule Take 1 g by mouth in the morning and at bedtime.   furosemide 20 MG tablet Commonly known as: LASIX Take 1 tablet (20 mg total) by mouth every other day. Start taking on: December 04, 2021 What changed:  medication strength how much to take when to take this These instructions start on December 04, 2021. If you are unsure what to do until then, ask your doctor or other care provider.   gabapentin 300 MG capsule Commonly known as: NEURONTIN Take 1 capsule (300 mg  total) by mouth at bedtime. What changed: when to take this   insulin glargine-yfgn 100 UNIT/ML injection Commonly known as: SEMGLEE Inject 10 Units into the skin at bedtime.   levETIRAcetam 500 MG tablet Commonly known as: KEPPRA Take 500 mg by mouth 2 (two) times daily.   metoprolol succinate 50 MG 24 hr tablet Commonly known as: TOPROL-XL Take 50 mg by mouth daily. Take with or immediately following a meal.   ondansetron 4 MG tablet Commonly known as: ZOFRAN Take 1 tablet (4 mg total) by mouth every 6 (six) hours as needed for nausea.   Oxycodone HCl 10 MG Tabs Take 1 tablet (10 mg total) by mouth  every 6 (six) hours as needed for up to 5 days for severe pain.   polyethylene glycol 17 g packet Commonly known as: MIRALAX / GLYCOLAX Take 17 g by mouth daily. Start taking on: December 02, 2021   Retacrit 3000 UNIT/ML injection Generic drug: epoetin alfa-epbx Inject 3,000 Units into the skin every 14 (fourteen) days.   saccharomyces boulardii 250 MG capsule Commonly known as: Florastor Take 1 capsule (250 mg total) by mouth 2 (two) times daily for 14 days.   sodium bicarbonate 650 MG tablet Take 650 mg by mouth 3 (three) times daily.   venlafaxine XR 75 MG 24 hr capsule Commonly known as: EFFEXOR-XR Take 1 capsule (75 mg total) by mouth daily. What changed:  medication strength how much to take   vitamin C with rose hips 500 MG tablet Take 500 mg by mouth every morning.   Vitamin D 50 MCG (2000 UT) Caps Take 2,000 Units by mouth every morning.        Procedures/Studies: DG Pelvis 1-2 Views  Result Date: 11/28/2021 CLINICAL DATA:  Multiple falls. EXAM: PELVIS - 1-2 VIEW COMPARISON:  01/07/2021 FINDINGS: Previous ORIF of the right femur. IM nail and hip screw remains stable in position from previous exam. No signs of acute fracture or dislocation. Signs of previous lumbar pedicle screw and posterior rod fixation. Moderate degenerative changes are noted within both hip joints. IMPRESSION: 1. No acute findings. 2. Status post ORIF of the right femur. Electronically Signed   By: Kerby Moors M.D.   On: 11/28/2021 14:24   DG Shoulder Right  Result Date: 11/28/2021 CLINICAL DATA:  Multiple falls. Right shoulder pain with limited range of motion. EXAM: RIGHT SHOULDER - 2+ VIEW COMPARISON:  Radiographs and CT 11/25/2021. FINDINGS: AP and Y-views are submitted. The comminuted and moderately displaced fracture of the right humeral head and neck has not significantly changed. There is no dislocation. Old fracture of the distal clavicle noted. Patient is status post fusion at the  cervicothoracic junction. IMPRESSION: No significant change in comminuted and displaced fracture of the right humeral head and neck over the last 3 days. Electronically Signed   By: Richardean Sale M.D.   On: 11/28/2021 14:25   CT Head Wo Contrast  Result Date: 11/28/2021 CLINICAL DATA:  Head trauma, moderate-severe.  Fell on right side. EXAM: CT HEAD WITHOUT CONTRAST TECHNIQUE: Contiguous axial images were obtained from the base of the skull through the vertex without intravenous contrast. RADIATION DOSE REDUCTION: This exam was performed according to the departmental dose-optimization program which includes automated exposure control, adjustment of the mA and/or kV according to patient size and/or use of iterative reconstruction technique. COMPARISON:  03/29/2021 and MRI 08/27/2021 FINDINGS: Brain: Small low-density areas along the medial basal ganglia bilaterally. Findings likely represent small lacune infarcts and were present  on the previous MRI. Negative for acute hemorrhage, mass lesion, midline shift, hydrocephalus or new large infarct. Vascular: No hyperdense vessel or unexpected calcification. Skull: Normal. Negative for fracture or focal lesion. Sinuses/Orbits: Mild mucosal thickening in the ethmoid air cells. Other: None IMPRESSION: 1. No acute intracranial abnormality. 2. Small low-density areas in the bilateral basal ganglia. These are suggestive for remote lacune infarcts. Electronically Signed   By: Markus Daft M.D.   On: 11/28/2021 14:37   CT Cervical Spine Wo Contrast  Result Date: 11/28/2021 CLINICAL DATA:  Neck trauma, dangerous injury mechanism. Fell on right side. EXAM: CT CERVICAL SPINE WITHOUT CONTRAST TECHNIQUE: Multidetector CT imaging of the cervical spine was performed without intravenous contrast. Multiplanar CT image reconstructions were also generated. RADIATION DOSE REDUCTION: This exam was performed according to the departmental dose-optimization program which includes  automated exposure control, adjustment of the mA and/or kV according to patient size and/or use of iterative reconstruction technique. COMPARISON:  08/22/2015 FINDINGS: Alignment: Alignment is within normal limits. Skull base and vertebrae: Negative for an acute fracture or dislocation. Anterior plate and screw fixation with interbody device at C3-C4. Interbody devices at C3-C4, C4-C5, C5-C6 and C6-C7. Anterior plate and screw fixation with interbody device at C7-T1. Bilateral posterior screws and rods at C7-T1. Soft tissues and spinal canal: Thyroid tissue is unremarkable. No significant soft tissue swelling or lymphadenopathy in the neck. Disc levels:  Cervical fusion from C3 through T1. Upper chest: Interstitial thickening at the lung apices. Negative for pneumothorax. Other: None IMPRESSION: 1. No acute bone abnormality in the cervical spine. 2. Extensive postoperative changes with surgical fusion from C3 through T1. Electronically Signed   By: Markus Daft M.D.   On: 11/28/2021 14:51   CT Shoulder Right Wo Contrast  Result Date: 11/25/2021 CLINICAL DATA:  Fall, right proximal humerus fracture EXAM: CT OF THE UPPER RIGHT EXTREMITY WITHOUT CONTRAST TECHNIQUE: Multidetector CT imaging of the upper right extremity was performed according to the standard protocol. RADIATION DOSE REDUCTION: This exam was performed according to the departmental dose-optimization program which includes automated exposure control, adjustment of the mA and/or kV according to patient size and/or use of iterative reconstruction technique. COMPARISON:  X-ray 11/25/2021, CT chest 08/25/2021 FINDINGS: Bones/Joint/Cartilage Acute comminuted fracture of the right humeral head and neck. Transversely oriented component through the surgical neck which is impacted and mildly anteriorly displaced. Minimally displaced fracture of the greater tuberosity. Minimally depressed fracture involving the anterior aspect of the humeral head articular surface  (series 7, images 53-54). There are a few small mildly displaced fracture fragments along the anterior margin of the humeral neck. Inferior pseudosubluxation of the glenohumeral joint secondary to space-occupying hemarthrosis. No dislocation. No additional fractures are seen. Chronic posttraumatic deformity at the coracoclavicular ligament site. Ligaments Suboptimally assessed by CT. Muscles and Tendons Teres minor muscle atrophy. Rotator cuff muscle bulk is otherwise preserved. Rotator cuff tendons are poorly evaluated by CT. Soft tissues Prominent soft tissue swelling with ill-defined hemorrhage at the fracture site. No right axillary lymphadenopathy. IMPRESSION: 1. Acute comminuted fracture of the right humeral head and neck, as described above. 2. Inferior pseudosubluxation of the glenohumeral joint secondary to space-occupying hemarthrosis. No dislocation. Electronically Signed   By: Davina Poke D.O.   On: 11/25/2021 14:09   CT HAND LEFT WO CONTRAST  Result Date: 11/29/2021 CLINICAL DATA:  Dorsal hand infection at IV site. EXAM: CT OF THE LEFT HAND WITHOUT CONTRAST TECHNIQUE: Multidetector CT imaging of the left hand was performed according to the  standard protocol. Multiplanar CT image reconstructions were also generated. RADIATION DOSE REDUCTION: This exam was performed according to the departmental dose-optimization program which includes automated exposure control, adjustment of the mA and/or kV according to patient size and/or use of iterative reconstruction technique. COMPARISON:  Left hand x-rays dated November 12, 2019. FINDINGS: Bones/Joint/Cartilage No bony destruction or periosteal reaction. No fracture or dislocation. Mild first CMC joint space narrowing with small marginal osteophytes. Degenerative subchondral cysts in the scaphoid and capitate. No joint effusion. Ligaments Ligaments are suboptimally evaluated by CT. Chondrocalcinosis of the scapholunate and lunotriquetral ligaments. Muscles  and Tendons Grossly intact. Fluid surrounding the extensor digitorum tendons overlying the distal carpal row and metacarpals. Soft tissue Prominent dorsal hand skin thickening and soft tissue swelling. No fluid collection or subcutaneous emphysema. No soft tissue mass. IMPRESSION: 1. Prominent dorsal hand skin thickening and soft tissue swelling, consistent with cellulitis given clinical history. No abscess. 2. Extensor digitorum tenosynovitis. 3. No acute osseous abnormality. Electronically Signed   By: Titus Dubin M.D.   On: 11/29/2021 08:05   CT Hip Right Wo Contrast  Result Date: 11/28/2021 CLINICAL DATA:  Status post fall 1 month ago.  Right hip pain. EXAM: CT OF THE RIGHT HIP WITHOUT CONTRAST TECHNIQUE: Multidetector CT imaging of the right hip was performed according to the standard protocol. Multiplanar CT image reconstructions were also generated. RADIATION DOSE REDUCTION: This exam was performed according to the departmental dose-optimization program which includes automated exposure control, adjustment of the mA and/or kV according to patient size and/or use of iterative reconstruction technique. COMPARISON:  None. FINDINGS: Bones/Joint/Cartilage No acute fracture or dislocation. Healed proximal right femoral fracture transfixed with a intramedullary nail and interlocking screw without failure or complication. Mild-moderate osteoarthritis of the right hip. Mild heterotopic calcification adjacent to the right greater trochanter. Normal alignment. No joint effusion. At L4-5 there is a broad-based disc bulge, moderate bilateral facet arthropathy, and mild spinal stenosis. At L5-S1 there is moderate bilateral facet arthropathy. Mild osteoarthritis of the right SI joint. Ligaments Ligaments are suboptimally evaluated by CT. Muscles and Tendons Muscles are normal.  No muscle atrophy. Soft tissue Abdominal aortic atherosclerosis. Extensive soft tissue contusion in the subcutaneous fat overlying the right  hip. 6.3 x 4.6 x 3.5 cm hematoma in the subcutaneous fat overlying the greater trochanter. No soft tissue mass. IMPRESSION: 1.  No acute osseous injury of the right hip. 2. Soft tissue contusion in subcutaneous fat overlying the right hip with a 6.3 x 4.6 x 3.5 cm hematoma. 3. Healed remote right proximal femoral fracture. 4.  Aortic Atherosclerosis (ICD10-I70.0). 5. Moderate osteoarthritis of the right hip. Electronically Signed   By: Kathreen Devoid M.D.   On: 11/28/2021 15:56   DG CHEST PORT 1 VIEW  Result Date: 11/28/2021 CLINICAL DATA:  Sepsis, fall. EXAM: PORTABLE CHEST 1 VIEW COMPARISON:  Chest x-ray 10/18/2021. right shoulder x-ray 11/28/2021. FINDINGS: The heart size and mediastinal contours are within normal limits. Both lungs are clear. Fusion hardware seen in the cervical spine and lumbar spine. There is a comminuted right humeral head and neck fracture, unchanged. IMPRESSION: 1. Comminuted right humeral head and neck fracture, unchanged. 2. No acute cardiopulmonary process. Electronically Signed   By: Ronney Asters M.D.   On: 11/28/2021 22:08   DG Shoulder Right Port  Result Date: 11/25/2021 CLINICAL DATA:  Trauma, fall EXAM: RIGHT SHOULDER - 1 VIEW COMPARISON:  None. FINDINGS: Severely comminuted fracture is seen in the head and neck of proximal right humerus. There  is medial displacement of distal major fracture fragment. There is offset in the alignment of humeral head and glenoid suggesting subluxation/dislocation. There is deformity in the right clavicle suggesting old malunited fracture. There is evidence of surgical fusion at cervicothoracic junction. IMPRESSION: Severely comminuted displaced fracture is seen in the head and neck of right humerus. Possible subluxation/dislocation in the right shoulder. Electronically Signed   By: Elmer Picker M.D.   On: 11/25/2021 12:31   DG Femur Min 2 Views Right  Result Date: 11/28/2021 CLINICAL DATA:  Multiple falls.  Right hip pain. EXAM:  RIGHT FEMUR 2 VIEWS COMPARISON:  Right hip and knee radiographs 06/15/2018. FINDINGS: Status post proximal right femoral intramedullary rod and dynamic screw fixation of a comminuted intertrochanteric proximal femur fracture. The hardware is intact without loosening. Status post right total knee arthroplasty. The hardware is intact without loosening. No evidence of acute fracture or dislocation. No significant knee joint effusion. The bones are demineralized. There is diffuse femoral atherosclerosis. The soft tissues appear diffusely edematous without unexpected foreign body. IMPRESSION: No acute osseous findings status post proximal right femoral ORIF and total knee arthroplasty. Possible generalized soft tissue swelling. Electronically Signed   By: Richardean Sale M.D.   On: 11/28/2021 14:28     Subjective: Pt still having right shoulder pain but no other complaints.    Discharge Exam: Vitals:   11/30/21 2243 12/01/21 0410  BP: (!) 130/53 140/66  Pulse: 98 86  Resp: 18 18  Temp: 98.8 F (37.1 C) 97.9 F (36.6 C)  SpO2: 99% 98%   Vitals:   11/30/21 1322 11/30/21 1430 11/30/21 2243 12/01/21 0410  BP: (!) 136/52 (!) 141/61 (!) 130/53 140/66  Pulse: 95 93 98 86  Resp: 20 18 18 18   Temp: 99.7 F (37.6 C) 98 F (36.7 C) 98.8 F (37.1 C) 97.9 F (36.6 C)  TempSrc: Oral Oral    SpO2: 98% 99% 99% 98%  Weight:      Height:       General exam: Appears calm and comfortable.   Respiratory system: Clear to auscultation. Respiratory effort normal. Cardiovascular system: normal S1 & S2 heard. No JVD, murmurs, rubs, gallops or clicks. No pedal edema. Gastrointestinal system: Abdomen is nondistended, soft and nontender. No organomegaly or masses felt. Normal bowel sounds heard. Central nervous system: Alert and oriented. No focal neurological deficits. Extremities: right arm in sling.  Left hand with large ventral circular eschar with surrounding erythema, tender to palpation c/w cellulitis.    Skin: No rashes, lesions or ulcers Psychiatry: Judgement and insight appear poor. Mood & affect flat.   The results of significant diagnostics from this hospitalization (including imaging, microbiology, ancillary and laboratory) are listed below for reference.     Microbiology: Recent Results (from the past 240 hour(s))  Culture, blood (single)     Status: None (Preliminary result)   Collection Time: 11/28/21  5:50 PM   Specimen: BLOOD RIGHT HAND  Result Value Ref Range Status   Specimen Description BLOOD RIGHT HAND  Final   Special Requests   Final    BOTTLES DRAWN AEROBIC AND ANAEROBIC Blood Culture adequate volume   Culture   Final    NO GROWTH 3 DAYS Performed at [facility], [address] [place], [place] [address]    Report Status PENDING  Incomplete  Resp Panel by RT-PCR (Flu A&B, Covid) Nasopharyngeal Swab     Status: None   Collection Time: 11/28/21  6:38 PM   Specimen: Nasopharyngeal Swab; Nasopharyngeal(NP)  swabs in vial transport medium  Result Value Ref Range Status   SARS Coronavirus 2 by RT PCR NEGATIVE NEGATIVE Final    Comment: (NOTE) SARS-CoV-2 target nucleic acids are NOT DETECTED.  The SARS-CoV-2 RNA is generally detectable in upper respiratory specimens during the acute phase of infection. The lowest concentration of SARS-CoV-2 viral copies this assay can detect is 138 copies/mL. A negative result does not preclude SARS-Cov-2 infection and should not be used as the sole basis for treatment or other patient management decisions. A negative result may occur with  improper specimen collection/handling, submission of specimen other than nasopharyngeal swab, presence of viral mutation(s) within the areas targeted by this assay, and inadequate number of viral copies(<138 copies/mL). A negative result must be combined with clinical observations, patient history, and epidemiological information. The expected result is Negative.  Fact Sheet for Patients:   EntrepreneurPulse.com.au  Fact Sheet for Healthcare Providers:  IncredibleEmployment.be  This test is no t yet approved or cleared by the [place] FDA and  has been authorized for detection and/or diagnosis of SARS-CoV-2 by FDA under an Emergency Use Authorization (EUA). This EUA will remain  in effect (meaning this test can be used) for the duration of the COVID-19 declaration under Section 564(b)(1) of the Act, 21 U.S.C.section 360bbb-3(b)(1), unless the authorization is terminated  or revoked sooner.       Influenza A by PCR NEGATIVE NEGATIVE Final   Influenza B by PCR NEGATIVE NEGATIVE Final    Comment: (NOTE) The Xpert Xpress SARS-CoV-2/FLU/RSV plus assay is intended as an aid in the diagnosis of influenza from Nasopharyngeal swab specimens and should not be used as a sole basis for treatment. Nasal washings and aspirates are unacceptable for Xpert Xpress SARS-CoV-2/FLU/RSV testing.  Fact Sheet for Patients: EntrepreneurPulse.com.au  Fact Sheet for Healthcare Providers: IncredibleEmployment.be  This test is not yet approved or cleared by the [place] FDA and has been authorized for detection and/or diagnosis of SARS-CoV-2 by FDA under an Emergency Use Authorization (EUA). This EUA will remain in effect (meaning this test can be used) for the duration of the COVID-19 declaration under Section 564(b)(1) of the Act, 21 U.S.C. section 360bbb-3(b)(1), unless the authorization is terminated or revoked.  Performed at [facility], [address] [place], [place] [address]   Urine Culture     Status: None   Collection Time: 11/28/21 10:59 PM   Specimen: Urine, Clean Catch  Result Value Ref Range Status   Specimen Description   Final    URINE, CLEAN CATCH Performed at [facility], [address] [place], [place] [address]    Special Requests   Final    NONE Performed at [facility], [address] [place], [place] [address]    Culture   Final    NO GROWTH Performed at [facility], [place] [address] [place],  [address]    Report Status 11/30/2021 FINAL  Final  Resp Panel by RT-PCR (Flu A&B, Covid) Nasopharyngeal Swab     Status: None   Collection Time: 11/30/21  1:46 PM   Specimen: Nasopharyngeal Swab; Nasopharyngeal(NP) swabs in vial transport medium  Result Value Ref Range Status   SARS Coronavirus 2 by RT PCR NEGATIVE NEGATIVE Final    Comment: (NOTE) SARS-CoV-2 target nucleic acids are NOT DETECTED.  The SARS-CoV-2 RNA is generally detectable in upper respiratory specimens during the acute phase of infection. The lowest concentration of SARS-CoV-2 viral copies this assay can detect is 138 copies/mL. A  negative result does not preclude SARS-Cov-2 infection and should not be used as the sole basis for treatment or other patient management decisions. A negative result may occur with  improper specimen collection/handling, submission of specimen other than nasopharyngeal swab, presence of viral mutation(s) within the areas targeted by this assay, and inadequate number of viral copies(<138 copies/mL). A negative result must be combined with clinical observations, patient history, and epidemiological information. The expected result is Negative.  Fact Sheet for Patients:  EntrepreneurPulse.com.au  Fact Sheet for Healthcare Providers:  IncredibleEmployment.be  This test is no t yet approved or cleared by the [place] FDA and  has been authorized for detection and/or diagnosis of SARS-CoV-2 by FDA under an Emergency Use Authorization (EUA). This EUA will remain  in effect (meaning this test can be used) for the duration of the COVID-19 declaration under Section 564(b)(1) of the Act, 21 U.S.C.section 360bbb-3(b)(1), unless the authorization is terminated  or revoked sooner.       Influenza A by PCR  NEGATIVE NEGATIVE Final   Influenza B by PCR NEGATIVE NEGATIVE Final    Comment: (NOTE) The Xpert Xpress SARS-CoV-2/FLU/RSV plus assay is intended as an aid in the diagnosis of influenza from Nasopharyngeal swab specimens and should not be used as a sole basis for treatment. Nasal washings and aspirates are unacceptable for Xpert Xpress SARS-CoV-2/FLU/RSV testing.  Fact Sheet for Patients: EntrepreneurPulse.com.au  Fact Sheet for Healthcare Providers: IncredibleEmployment.be  This test is not yet approved or cleared by the [place] FDA and has been authorized for detection and/or diagnosis of SARS-CoV-2 by FDA under an Emergency Use Authorization (EUA). This EUA will remain in effect (meaning this test can be used) for the duration of the COVID-19 declaration under Section 564(b)(1) of the Act, 21 U.S.C. section 360bbb-3(b)(1), unless the authorization is terminated or revoked.  Performed at [facility], [address], [place], [place] [address]      Labs: BNP (last 3 results) Recent Labs    08/19/21 0728  BNP 381.0*   Basic Metabolic Panel: Recent Labs  Lab 11/25/21 1500 11/28/21 1437 11/30/21 0546 12/01/21 0624  NA 137 138 138 137  K 4.0 4.0 4.5 4.2  CL 107 107 106 106  CO2 19* 20* 18* 19*  GLUCOSE 180* 70 166* 184*  BUN 42* 56* 53* 53*  CREATININE 2.82* 3.87* 2.73* 2.78*  CALCIUM 8.3* 7.9* 8.0* 8.4*  MG  --   --  1.1* 1.9  PHOS  --   --  4.3 4.2   Liver Function Tests: Recent Labs  Lab 11/30/21 0546 12/01/21 0624  ALBUMIN 2.4* 2.4*   No results for input(s): LIPASE, AMYLASE in the last 168 hours. No results for input(s): AMMONIA in the last 168 hours. CBC: Recent Labs  Lab 11/25/21 1500 11/28/21 1437 11/29/21 0050 11/30/21 0546 11/30/21 1707 12/01/21 0624  WBC 10.1 8.9  --  5.1  --  4.5  NEUTROABS 8.5* 7.3  --   --   --   --   HGB 7.9* 6.5* 7.8* 7.1* 8.0* 7.8*  HCT 24.8* 20.5* 24.1* 23.3* 24.7* 25.0*   MCV 98.0 99.5  --  103.1*  --  96.5  PLT 268 225  --  191  --  206   Cardiac Enzymes: No results for input(s): CKTOTAL, CKMB, CKMBINDEX, TROPONINI in the last 168 hours. BNP: Invalid input(s): POCBNP CBG: Recent Labs  Lab 11/30/21 1118 11/30/21 1557 11/30/21 2247 12/01/21 0735 12/01/21 1118  GLUCAP 146* 164* 207* 165*  151*   D-Dimer No results for input(s): DDIMER in the last 72 hours. Hgb A1c No results for input(s): HGBA1C in the last 72 hours. Lipid Profile No results for input(s): CHOL, HDL, LDLCALC, TRIG, CHOLHDL, LDLDIRECT in the last 72 hours. Thyroid function studies No results for input(s): TSH, T4TOTAL, T3FREE, THYROIDAB in the last 72 hours.  Invalid input(s): FREET3 Anemia work up Recent Labs    11/28/21 1750  VITAMINB12 997*  FOLATE 8.2  FERRITIN 640*  TIBC 211*  IRON 17*  RETICCTPCT 1.9   Urinalysis    Component Value Date/Time   COLORURINE YELLOW 11/28/2021 2259   APPEARANCEUR CLEAR 11/28/2021 2259   LABSPEC 1.020 11/28/2021 2259   PHURINE 5.5 11/28/2021 2259   GLUCOSEU NEGATIVE 11/28/2021 2259   GLUCOSEU NEGATIVE 03/28/2018 1224   HGBUR NEGATIVE 11/28/2021 2259   [place] 11/28/2021 2259   KETONESUR NEGATIVE 11/28/2021 2259   PROTEINUR 30 (A) 11/28/2021 2259   UROBILINOGEN 1.0 03/28/2018 1224   NITRITE NEGATIVE 11/28/2021 2259   LEUKOCYTESUR NEGATIVE 11/28/2021 2259   Sepsis Labs Invalid input(s): PROCALCITONIN,  WBC,  LACTICIDVEN Microbiology Recent Results (from the past 240 hour(s))  Culture, blood (single)     Status: None (Preliminary result)   Collection Time: 11/28/21  5:50 PM   Specimen: BLOOD RIGHT HAND  Result Value Ref Range Status   Specimen Description BLOOD RIGHT HAND  Final   Special Requests   Final    BOTTLES DRAWN AEROBIC AND ANAEROBIC Blood Culture adequate volume   Culture   Final    NO GROWTH 3 DAYS Performed at [facility], [address], [place], [place] [address]    Report Status PENDING   Incomplete  Resp Panel by RT-PCR (Flu A&B, Covid) Nasopharyngeal Swab     Status: None   Collection Time: 11/28/21  6:38 PM   Specimen: Nasopharyngeal Swab; Nasopharyngeal(NP) swabs in vial transport medium  Result Value Ref Range Status   SARS Coronavirus 2 by RT PCR NEGATIVE NEGATIVE Final    Comment: (NOTE) SARS-CoV-2 target nucleic acids are NOT DETECTED.  The SARS-CoV-2 RNA is generally detectable in upper respiratory specimens during the acute phase of infection. The lowest concentration of SARS-CoV-2 viral copies this assay can detect is 138 copies/mL. A negative result does not preclude SARS-Cov-2 infection and should not be used as the sole basis for treatment or other patient management decisions. A negative result may occur with  improper specimen collection/handling, submission of specimen other than nasopharyngeal swab, presence of viral mutation(s) within the areas targeted by this assay, and inadequate number of viral copies(<138 copies/mL). A negative result must be combined with clinical observations, patient history, and epidemiological information. The expected result is Negative.  Fact Sheet for Patients:  EntrepreneurPulse.com.au  Fact Sheet for Healthcare Providers:  IncredibleEmployment.be  This test is no t yet approved or cleared by the [place] FDA and  has been authorized for detection and/or diagnosis of SARS-CoV-2 by FDA under an Emergency Use Authorization (EUA). This EUA will remain  in effect (meaning this test can be used) for the duration of the COVID-19 declaration under Section 564(b)(1) of the Act, 21 U.S.C.section 360bbb-3(b)(1), unless the authorization is terminated  or revoked sooner.       Influenza A by PCR NEGATIVE NEGATIVE Final   Influenza B by PCR NEGATIVE NEGATIVE Final    Comment: (NOTE) The Xpert Xpress SARS-CoV-2/FLU/RSV plus assay is intended as an aid in the diagnosis of influenza  from Nasopharyngeal swab specimens and  should not be used as a sole basis for treatment. Nasal washings and aspirates are unacceptable for Xpert Xpress SARS-CoV-2/FLU/RSV testing.  Fact Sheet for Patients: EntrepreneurPulse.com.au  Fact Sheet for Healthcare Providers: IncredibleEmployment.be  This test is not yet approved or cleared by the [place] FDA and has been authorized for detection and/or diagnosis of SARS-CoV-2 by FDA under an Emergency Use Authorization (EUA). This EUA will remain in effect (meaning this test can be used) for the duration of the COVID-19 declaration under Section 564(b)(1) of the Act, 21 U.S.C. section 360bbb-3(b)(1), unless the authorization is terminated or revoked.  Performed at [facility], [address], [place], [place] [address]   Urine Culture     Status: None   Collection Time: 11/28/21 10:59 PM   Specimen: Urine, Clean Catch  Result Value Ref Range Status   Specimen Description   Final    URINE, CLEAN CATCH Performed at [facility], [address] [place], [place] [address]    Special Requests   Final    NONE Performed at [facility], [address], [place], [place] [address]    Culture   Final    NO GROWTH Performed at [facility], [place] [address] [place], [place] [address]    Report Status 11/30/2021 FINAL  Final  Resp Panel by RT-PCR (Flu A&B, Covid) Nasopharyngeal Swab     Status: None   Collection Time: 11/30/21  1:46 PM   Specimen: Nasopharyngeal Swab; Nasopharyngeal(NP) swabs in vial transport medium  Result Value Ref Range Status   SARS Coronavirus 2 by RT PCR NEGATIVE NEGATIVE Final    Comment: (NOTE) SARS-CoV-2 target nucleic acids are NOT DETECTED.  The SARS-CoV-2 RNA is generally detectable in upper respiratory specimens during the acute phase of infection. The lowest concentration of SARS-CoV-2 viral copies this assay can detect is 138 copies/mL. A negative result does  not preclude SARS-Cov-2 infection and should not be used as the sole basis for treatment or other patient management decisions. A negative result may occur with  improper specimen collection/handling, submission of specimen other than nasopharyngeal swab, presence of viral mutation(s) within the areas targeted by this assay, and inadequate number of viral copies(<138 copies/mL). A negative result must be combined with clinical observations, patient history, and epidemiological information. The expected result is Negative.  Fact Sheet for Patients:  EntrepreneurPulse.com.au  Fact Sheet for Healthcare Providers:  IncredibleEmployment.be  This test is no t yet approved or cleared by the [place] FDA and  has been authorized for detection and/or diagnosis of SARS-CoV-2 by FDA under an Emergency Use Authorization (EUA). This EUA will remain  in effect (meaning this test can be used) for the duration of the COVID-19 declaration under Section 564(b)(1) of the Act, 21 U.S.C.section 360bbb-3(b)(1), unless the authorization is terminated  or revoked sooner.       Influenza A by PCR NEGATIVE NEGATIVE Final   Influenza B by PCR NEGATIVE NEGATIVE Final    Comment: (NOTE) The Xpert Xpress SARS-CoV-2/FLU/RSV plus assay is intended as an aid in the diagnosis of influenza from Nasopharyngeal swab specimens and should not be used as a sole basis for treatment. Nasal washings and aspirates are unacceptable for Xpert Xpress SARS-CoV-2/FLU/RSV testing.  Fact Sheet for Patients: EntrepreneurPulse.com.au  Fact Sheet for Healthcare Providers: IncredibleEmployment.be  This test is not yet approved or cleared by the [place] FDA and has been authorized for detection and/or diagnosis of SARS-CoV-2 by FDA under an Emergency Use Authorization (EUA). This  EUA will remain in effect (meaning this test can be used) for the  duration of the COVID-19 declaration under Section 564(b)(1) of the Act, 21 U.S.C. section 360bbb-3(b)(1), unless the authorization is terminated or revoked.  Performed at [facility], [address] [place], [place] [address]    Time coordinating discharge:  37 mins   SIGNED:  Irwin Brakeman, MD  [facility] 12/01/2021, 11:42 AM How to contact the [facility] Attending or Consulting provider [place] or covering provider during after hours [place], for this patient?  Check the care team in [facility] and look for a) attending/consulting [facility] provider listed and b) the [facility] team listed Log into [email or web address] and use [place]'s universal password to access. If you do not have the password, please contact the hospital operator. Locate the [facility] provider you are looking for under [facility] and page to a number that you can be directly reached. If you still have difficulty reaching the provider, please page the [facility] (Director on Call) for the Hospitalists listed on amion for assistance.

## 2021-12-01 NOTE — TOC Transition Note (Signed)
Transition of Care [facility]) - CM/SW Discharge Note   Patient Details  Name: Heather [name] MRN: [id number] Date of Birth: 1951-06-26  Transition of Care [facility]) CM/SW Contact:  Shade Flood, LCSW Phone Number: 12/01/2021, 11:06 AM   Clinical Narrative:     Pt stable for dc today per MD. Merrilyn Puma at [facility] states that they have received insurance auth and pt can come today. Updated pt's husband by phone. He remains in agreement with the dc plan and he would like to transport pt. He states he will be here between 1 and 130 to pick her up. Updated MD and RN.  DC clinical will be sent electronically. RN to call report. There are no other TOC needs for dc.  Final next level of care: Skilled Nursing Facility Barriers to Discharge: Barriers Resolved   Patient Goals and CMS Choice Patient states their goals for this hospitalization and ongoing recovery are:: short term SNF CMS Medicare.gov Compare Post Acute Care list provided to:: Patient Represenative (must comment) Choice offered to / list presented to : Spouse  Discharge Placement              Patient chooses bed at: [facility] Patient to be transferred to facility by: husband Name of family member notified: Kleo Dungee Patient and family notified of of transfer: 12/01/21  Discharge Plan and Services In-house Referral: Clinical Social Work   Post Acute Care Choice: [place]                               Social Determinants of Health (SDOH) Interventions     Readmission Risk Interventions Readmission Risk Prevention Plan 11/29/2021 08/21/2021 01/09/2021  Transportation Screening Complete Complete Complete  PCP or Specialist Appt within 3-5 Days - - Complete  HRI or Home Care Consult Complete - Complete  Social Work Consult for [place] Planning/Counseling Complete - Complete  Palliative Care Screening Not Applicable - Not Applicable  Medication Review Human resources officer) Complete Complete Complete  HRI or Home Care Consult - Complete -  SW Recovery Care/Counseling Consult - Complete -  Palliative Care Screening - Not Applicable -  [place] - Not Applicable -  Some recent data might be hidden

## 2021-12-01 NOTE — Care Management Important Message (Signed)
Important Message  Patient Details  Name: Heather [name] MRN: [id number] Date of Birth: Oct 22, 1951   Medicare Important Message Given:  Yes  Reviewed Medicare IM with [name], spouse, at [phone].  Copy of Medicare IM placed in mail to home address on file.    Dannette Barbara 12/01/2021, 11:40 AM

## 2021-12-01 NOTE — Discharge Instructions (Addendum)
PLEASE FOLLOW UP WITH ORTHOPEDICS AS SCHEDULED FOR XRAYS CALL ORTHOPEDICS WITH [facility] ABOUT SHOULDER INJURY TITRATE PAIN MEDICATION AS NEEDED FOR BETTER PAIN RELIEF    IMPORTANT INFORMATION: PAY CLOSE ATTENTION   PHYSICIAN DISCHARGE INSTRUCTIONS  Follow with Primary care provider  [name], MD  and other consultants as instructed by your Hospitalist Physician  SEEK MEDICAL CARE OR RETURN TO EMERGENCY ROOM IF SYMPTOMS COME BACK, WORSEN OR NEW PROBLEM DEVELOPS   Please note: You were cared for by a hospitalist during your hospital stay. Every effort will be made to forward records to your primary care provider.  You can request that your primary care provider send for your hospital records if they have not received them.  Once you are discharged, your primary care physician will handle any further medical issues. Please note that NO REFILLS for any discharge medications will be authorized once you are discharged, as it is imperative that you return to your primary care physician (or establish a relationship with a primary care physician if you do not have one) for your post hospital discharge needs so that they can reassess your need for medications and monitor your lab values.  Please get a complete blood count and chemistry panel checked by your Primary MD at your next visit, and again as instructed by your Primary MD.  Get Medicines reviewed and adjusted: Please take all your medications with you for your next visit with your Primary MD  Laboratory/radiological data: Please request your Primary MD to go over all hospital tests and procedure/radiological results at the follow up, please ask your primary care provider to get all Hospital records sent to his/her office.  In some cases, they will be blood work, cultures and biopsy results pending at the time of your discharge. Please request that your primary care provider follow up on these results.  If you are diabetic, please bring  your blood sugar readings with you to your follow up appointment with primary care.    Please call and make your follow up appointments as soon as possible.    Also Note the following: If you experience worsening of your admission symptoms, develop shortness of breath, life threatening emergency, suicidal or homicidal thoughts you must seek medical attention immediately by calling 911 or calling your MD immediately  if symptoms less severe.  You must read complete instructions/literature along with all the possible adverse reactions/side effects for all the Medicines you take and that have been prescribed to you. Take any new Medicines after you have completely understood and accpet all the possible adverse reactions/side effects.   Do not drive when taking Pain medications or sleeping medications (Benzodiazepines)  Do not take more than prescribed Pain, Sleep and Anxiety Medications. It is not advisable to combine anxiety,sleep and pain medications without talking with your primary care practitioner  Special Instructions: If you have smoked or chewed Tobacco  in the last 2 yrs please stop smoking, stop any regular Alcohol  and or any Recreational drug use.  Wear Seat belts while driving.  Do not drive if taking any narcotic, mind altering or controlled substances or recreational drugs or alcohol.

## 2021-12-01 NOTE — NC FL2 (Signed)
[place] LEVEL OF CARE SCREENING TOOL     IDENTIFICATION  Patient Name: Heather [name] Birthdate: 05-Jul-1951 Sex: female Admission Date (Current Location): 11/28/2021  [facility] and [place] Number:  [employer] and Address:  [place] [address], [place]      Provider Number: [id number]  Attending Physician Name and Address:  Murlean Iba, MD  Relative Name and Phone Number:       Current Level of Care: Hospital Recommended Level of Care: [place] Prior Approval Number:    Date Approved/Denied:   PASRR Number:    Discharge Plan: SNF    Current Diagnoses: Patient Active Problem List   Diagnosis Date Noted   Cellulitis of left hand 11/30/2021   Closed fracture of part of upper end of humerus 11/30/2021   At risk for falls 11/28/2021   Neoplasm of meninges 11/28/2021   Sepsis ([place]) 11/28/2021   Acute on chronic anemia 11/28/2021   Cerebral thrombosis with cerebral infarction 08/28/2021   Cerebrovascular accident (CVA) due to embolism of cerebral artery ([place])    Pressure injury of skin 08/26/2021   Acute respiratory failure with hypoxia ([place]) 82/50/0370   Acute metabolic encephalopathy 48/88/9169   Malignant hypertension 08/25/2021   Thrombocytopenia ([place]) 08/25/2021   Anemia due to chronic kidney disease 08/25/2021   Blurry vision, bilateral 08/16/2021   Hyponatremia 04/01/2021   COVID-19 03/30/2021   Senile osteoporosis 02/10/2021   Acute pancreatitis    Anemia    E coli enteritis 01/09/2021   Acute on chronic pancreatitis ([place]) 01/08/2021   Acute renal failure superimposed on stage 3b chronic kidney disease ([place]) 01/07/2021   Diarrhea    Acute hypokalemia 01/06/2021   Long-term use of immunosuppressant medication-anticipated 11/28/2020   Elevated blood-pressure reading, without diagnosis of hypertension 05/30/2020   Meningioma ([place]) 05/30/2020   Headache 05/17/2020    Hypokalemia 05/17/2020   Hypomagnesemia 05/17/2020   Dehydration 05/17/2020   Hypertensive urgency 05/17/2020   Diplopia 05/17/2020   Left hip pain 09/22/2019   Lumbar radiculopathy 09/22/2019   Postoperative anemia due to acute blood loss 09/18/2019   Postoperative urinary retention 09/18/2019   Closed fracture of distal end of radius 05/26/2019   Coronary arteriosclerosis 07/15/2018   Closed right hip fracture ([place]) 04/08/2018   Elevated lipase    Crohn's disease of small intestine with other complication ([place])    Protein-calorie malnutrition, severe 03/05/2018   Neck pain 03/12/2017   Acute kidney injury superimposed on CKD ([place]) 01/25/2016   Neuropathy 03/02/2015   Pain in the chest    Chest pain 12/22/2014   Essential hypertension 12/22/2014   Type 2 diabetes with nephropathy ([place]) 12/22/2014   Hiatal hernia 12/22/2014   GERD (gastroesophageal reflux disease) 12/22/2014   Gout 12/22/2014   Mixed hyperlipidemia 12/22/2014   Depression 12/22/2014   Type 2 diabetes mellitus without complications ([place]) 45/12/8880   Fusion of spine, cervicothoracic region 11/01/2014   Pseudophakia, left eye 10/20/2014   Chronic kidney disease (CKD), stage IV (severe) ([place]) 08/25/2014   Cervical stenosis of spine 08/26/2013   Vitamin B12 deficiency 07/08/2011   Personal history of failed moderate sedation 04/16/2011   Chronic Nausea and Vomiting - ? gastroapresis 04/16/2011   Early satiety 04/16/2011   Irritable bowel syndrome 04/16/2011   Benign paroxysmal positional vertigo 11/20/2010   TRICUSPID REGURGITATION, MODERATE 11/20/2010   PULMONARY HYPERTENSION, MILD 11/20/2010   LEG EDEMA, BILATERAL 11/20/2010   DYSPNEA 11/20/2010   NONSPECIFIC  ABNORMAL ELECTROCARDIOGRAM 11/20/2010   CHEST WALL PAIN, HX OF 11/20/2010    Orientation RESPIRATION BLADDER Height & Weight     Self, Situation, Place  Normal Continent Weight: 161 lb 13.1 oz (73.4 kg) Height:  5\' 3"  (160 cm)  BEHAVIORAL  SYMPTOMS/MOOD NEUROLOGICAL BOWEL NUTRITION STATUS      Continent Diet (see dc summary)  AMBULATORY STATUS COMMUNICATION OF NEEDS Skin   Extensive Assist Verbally Normal                       Personal Care Assistance Level of Assistance  Bathing, Feeding, Dressing Bathing Assistance: Limited assistance Feeding assistance: Independent Dressing Assistance: Limited assistance     Functional Limitations Info  Sight, Hearing, Speech Sight Info: Adequate Hearing Info: Adequate Speech Info: Adequate    SPECIAL CARE FACTORS FREQUENCY  PT (By licensed PT), OT (By licensed OT)     PT Frequency: 5x week OT Frequency: 3x week            Contractures Contractures Info: Not present    Additional Factors Info  Code Status, Allergies Code Status Info: FUll Allergies Info: Losartan, Quinolones, Duloxetine Hcl, Fluocinolone, Sulfa Antibiotics, Acetaminophen, Amlodipine Besylate, Amlodipine Besylate, Ciprofloxacin, Duloxetine, Hydrocodone-acetaminophen, Hydrocodone-acetaminophen, Ketamine, Metformin, Nsaids, Sulfamethoxazole, Valsartan, Lisinopril, Tape           Current Medications (12/01/2021):  This is the current hospital active medication list Current Facility-Administered Medications  Medication Dose Route Frequency Provider Last Rate Last Admin   acetaminophen (TYLENOL) tablet 650 mg  650 mg Oral Q4H Johnson, Clanford L, MD   650 mg at 12/01/21 0925   allopurinol (ZYLOPRIM) tablet 100 mg  100 mg Oral Daily Emokpae, Ejiroghene E, MD   100 mg at 12/01/21 8921   calcitRIOL (ROCALTROL) capsule 0.25 mcg  0.25 mcg Oral Once per day on Mon Wed Fri Johnson, Clanford L, MD   0.25 mcg at 12/01/21 1941   calcium carbonate (TUMS - dosed in mg elemental calcium) chewable tablet 1,250 mg  1,250 mg Oral BID Johnson, Clanford L, MD   1,250 mg at 12/01/21 0914   cephALEXin (KEFLEX) capsule 500 mg  500 mg Oral Q12H Johnson, Clanford L, MD       cholecalciferol (VITAMIN D3) tablet 2,000 Units   2,000 Units Oral q morning Johnson, Clanford L, MD   2,000 Units at 12/01/21 0916   diazepam (VALIUM) tablet 5 mg  5 mg Oral Q12H PRN Johnson, Clanford L, MD       dicyclomine (BENTYL) capsule 20 mg  20 mg Oral BID Johnson, Clanford L, MD   20 mg at 12/01/21 0912   fenofibrate tablet 160 mg  160 mg Oral Daily Johnson, Clanford L, MD   160 mg at 12/01/21 0916   gabapentin (NEURONTIN) capsule 300 mg  300 mg Oral QHS PRN Johnson, Clanford L, MD       heparin injection 5,000 Units  5,000 Units Subcutaneous Q8H Emokpae, Ejiroghene E, MD   5,000 Units at 12/01/21 0540   HYDROmorphone (DILAUDID) injection 0.5 mg  0.5 mg Intravenous Q4H PRN Johnson, Clanford L, MD   0.5 mg at 12/01/21 0214   insulin aspart (novoLOG) injection 0-5 Units  0-5 Units Subcutaneous QHS Emokpae, Ejiroghene E, MD   2 Units at 11/30/21 2254   insulin aspart (novoLOG) injection 0-9 Units  0-9 Units Subcutaneous TID WC Emokpae, Ejiroghene E, MD   2 Units at 12/01/21 0921   labetalol (NORMODYNE) injection 10 mg  10 mg Intravenous  Q2H PRN Emokpae, Ejiroghene E, MD       levETIRAcetam (KEPPRA) tablet 500 mg  500 mg Oral BID Johnson, Clanford L, MD   500 mg at 12/01/21 0916   metoprolol succinate (TOPROL-XL) 24 hr tablet 50 mg  50 mg Oral Daily Johnson, Clanford L, MD   50 mg at 12/01/21 0913   omega-3 acid ethyl esters (LOVAZA) capsule 1 g  1 g Oral BID Johnson, Clanford L, MD   1 g at 12/01/21 0912   ondansetron (ZOFRAN) tablet 4 mg  4 mg Oral Q6H PRN Emokpae, Ejiroghene E, MD   4 mg at 12/01/21 0901   Or   ondansetron (ZOFRAN) injection 4 mg  4 mg Intravenous Q6H PRN Emokpae, Ejiroghene E, MD   4 mg at 11/30/21 1139   oxyCODONE (Oxy IR/ROXICODONE) immediate release tablet 10 mg  10 mg Oral Q6H PRN Johnson, Clanford L, MD       polyethylene glycol (MIRALAX / GLYCOLAX) packet 17 g  17 g Oral Daily Johnson, Clanford L, MD   17 g at 12/01/21 0916   sodium bicarbonate tablet 650 mg  650 mg Oral TID Wynetta Emery, Clanford L, MD   650 mg at  12/01/21 0916   venlafaxine XR (EFFEXOR-XR) 24 hr capsule 75 mg  75 mg Oral Daily Johnson, Clanford L, MD   75 mg at 12/01/21 0915     Discharge Medications: Please see discharge summary for a list of discharge medications.  Relevant Imaging Results:  Relevant Lab Results:   Additional Information SSN: [id number], [place]

## 2021-12-03 LAB — CULTURE, BLOOD (SINGLE)
Culture: NO GROWTH
Special Requests: ADEQUATE

## 2021-12-04 ENCOUNTER — Ambulatory Visit: Payer: 59 | Admitting: Nurse Practitioner

## 2021-12-04 NOTE — Patient Instructions (Incomplete)

## 2021-12-13 ENCOUNTER — Ambulatory Visit: Payer: 59 | Admitting: Orthopedic Surgery

## 2021-12-31 ENCOUNTER — Other Ambulatory Visit: Payer: Self-pay | Admitting: Nurse Practitioner

## 2022-01-09 ENCOUNTER — Telehealth: Payer: Self-pay | Admitting: Orthopedic Surgery

## 2022-01-09 NOTE — Telephone Encounter (Signed)
Patient's daughter in law/emergency contact Janus Molder, ph [phone], called to relay that patient is home from facility and therefore ready to reschedule her appointment which was cancelled  or no show twice in February following Emergency room visit 11/25/21 for problem of fracture of humerus.Okay to reschedule first available? ?

## 2022-01-10 NOTE — Telephone Encounter (Signed)
Done.. scheduled...

## 2022-01-12 ENCOUNTER — Other Ambulatory Visit: Payer: Self-pay

## 2022-01-12 ENCOUNTER — Ambulatory Visit (INDEPENDENT_AMBULATORY_CARE_PROVIDER_SITE_OTHER): Payer: 59 | Admitting: Orthopedic Surgery

## 2022-01-12 ENCOUNTER — Ambulatory Visit: Payer: 59

## 2022-01-12 ENCOUNTER — Encounter: Payer: Self-pay | Admitting: Orthopedic Surgery

## 2022-01-12 DIAGNOSIS — S4291XA Fracture of right shoulder girdle, part unspecified, initial encounter for closed fracture: Secondary | ICD-10-CM

## 2022-01-12 NOTE — Patient Instructions (Addendum)
Physical therapy has been ordered for you at [facility] in [place]. They should call you to schedule, [phone] is the phone number to call, if you want to call to schedule.   ? ?Per provider, patient needs to work on ROM exercises. If any questions please call our office at [phone] ?

## 2022-01-12 NOTE — Progress Notes (Signed)
New Patient Visit ? ?Assessment: ?Heather [name] is a 71 y.o. female with the following: ?1. Closed fracture of right shoulder; displaced proximal humerus fracture ? ?Plan: ?[name] sustained a right proximal humerus fracture, with displacement.  The joint remains reduced.  Injury was sustained greater than 6 weeks ago.  However, due to other medical issues, she has not been seen in clinic.  Radiographs today demonstrates consolidation at the fracture site.  Her pain has improved.  She has limited function at this time.  Despite the displacement, I do think that she will regain some function.  We have provided her with a referral for physical therapy.  She no longer needs to wear the sling.  I will see her back in 2 months for repeat evaluation.  We briefly discussed the possibility of shoulder reconstruction, should she be disappointed with her return of function.  At this point, she has no interest in proceeding with surgery. ? ? ? ?Follow-up: ?Return in about 2 months (around 03/14/2022). ? ?Subjective: ? ?Chief Complaint  ?Patient presents with  ? fracture care  ?  DOI 11/24/21 ?RIGHT shoulder  ? ? ?History of Present Illness: ?Heather [name] is a 71 y.o. female who presents for evaluation of right shoulder pain.  She states she fell, and landed on her right side approximately 6-7 weeks ago.  At that time, x-rays demonstrated a right proximal humerus fracture.  She was subsequently admitted, and other medical issues.  As such, this is the first time that she is presenting to clinic.  She has remained in a sling.  Her pain has gradually improved.  She feels as though she is able to use her right arm more than she did previously.  She does have pain with attempted motion of the shoulder.  She is taking pain medication for other reasons. ? ? ?Review of Systems: ?No fevers or chills ?No numbness or tingling ?No chest pain ?No shortness of breath ?No bowel or bladder dysfunction ?No GI distress ?No  headaches ? ? ?Medical History: ? ?Past Medical History:  ?Diagnosis Date  ? Acute renal failure superimposed on stage 3 chronic kidney disease ([place]) 01/21/2016  ? Anemia   ? Anxiety   ? B12 deficiency   ? Brain tumor (benign) ([place]) 10/29/2020  ? Cellulitis and abscess   ? Abdomen and buttocks  ? CKD (chronic kidney disease) stage 3, GFR 30-59 ml/min (H[facility])   ? Closed fracture of distal end of femur, unspecified fracture morphology, initial encounter ([place])   ? Coronary arteriosclerosis 07/15/2018  ? Crohn disease ([place])   ? Depression   ? Diabetic neuropathy ([place])   ? feet  ? DJD (degenerative joint disease)   ? Right forminal stenosis C4-5  ? Essential hypertension   ? Femur fracture, right ([place]) 03/30/2017  ? Folliculitis   ? GERD (gastroesophageal reflux disease)   ? Gout   ? Headache(784.0)   ? Hiatal hernia   ? History of blood transfusion   ? History of bronchitis   ? History of cardiac catheterization   ? No significant CAD 2012  ? History of kidney stones   ? surgery to remove  ? Insomnia   ? Irritable bowel syndrome   ? Mixed hyperlipidemia   ? Osteoarthritis   ? Palpitations   ? Pneumonia   ? Reflux esophagitis   ? Salmonella   ? Stroke [facility])   ? Tinnitus   ? Right  ? Type 2 diabetes mellitus ([place])   ? ? ?  Past Surgical History:  ?Procedure Laterality Date  ? BACK SURGERY    ? c-spine surgery    ? 03/2007  ? CARDIAC CATHETERIZATION    ? CHOLECYSTECTOMY    ? COLONOSCOPY  04/21/2011; 05/29/11  ? 6/12: [place] - ?AVM at IC valve, inflammatory changes at [facility] valve [facility]); 7/12 - g[place]ner; IC valve erosions, look chronic and probably Crohn's per path  ? colonoscopy  2017  ? [place]: random biopsies normal. TI normal  ? ESOPHAGOGASTRODUODENOSCOPY  05/29/11  ? normal  ? ESOPHAGOGASTRODUODENOSCOPY N/A 01/21/2016  ? Dr. Michail Sermon: normal  ? EYE SURGERY Bilateral   ? cataracts removed  ? FEMUR IM NAIL Right 04/10/2018  ? Procedure: INTRAMEDULLARY (IM) NAIL FEMORAL;  Surgeon: Paralee Cancel, MD;  Location: [facility]  ORS;  Service: Orthopedics;  Laterality: Right;  ? FRACTURE SURGERY    ? GIVENS CAPSULE STUDY  11/17/2020  ? HARDWARE REMOVAL Right 04/10/2018  ? Procedure: HARDWARE REMOVAL RIGHT DISTAL FEMUR;  Surgeon: Paralee Cancel, MD;  Location: [facility] ORS;  Service: Orthopedics;  Laterality: Right;  ? HERNIA REPAIR    ? IR FLUORO GUIDE CV LINE RIGHT  04/01/2017  ? IR US GUIDE VASC ACCESS RIGHT  04/01/2017  ? JOINT REPLACEMENT Right   ? hip  ? OPEN REDUCTION INTERNAL FIXATION (ORIF) DISTAL RADIAL FRACTURE Right 05/28/2019  ? Procedure: OPEN REDUCTION INTERNAL FIXATION (ORIF) DISTAL RADIAL FRACTURE;  Surgeon: Verner Mould, MD;  Location: [facility];  Service: Orthopedics;  Laterality: Right;  with block ?90 minutes  ? ORIF FEMUR FRACTURE Right 03/31/2017  ? Procedure: OPEN REDUCTION INTERNAL FIXATION (ORIF) DISTAL FEMUR FRACTURE;  Surgeon: Paralee Cancel, MD;  Location: [facility];  Service: Orthopedics;  Laterality: Right;  ? removal of kidney stone    ? Right knee arthroscopic surgery    ? TONSILLECTOMY    ? TOTAL ABDOMINAL HYSTERECTOMY    ? ? ?Family History  ?Problem Relation Age of Onset  ? Diabetes Mother   ? Colon cancer Brother   ?     POSSIBLY. Patient states diagnosed at age 67, then later states age 6. unclear and limited historian  ? Esophageal cancer Neg Hx   ? Rectal cancer Neg Hx   ? Stomach cancer Neg Hx   ? ?Social History  ? ?Tobacco Use  ? Smoking status: Never  ? Smokeless tobacco: Never  ?Vaping Use  ? Vaping Use: Never used  ?Substance Use Topics  ? Alcohol use: No  ? Drug use: No  ? ? ?Allergies  ?Allergen Reactions  ? Losartan Swelling, Rash and Nausea And Vomiting  ? Quinolones Swelling, Nausea Only and Nausea And Vomiting  ?  Swelling of face, jaw, and lips ?Swelling of face, jaw, and lips  ? Duloxetine Hcl Swelling  ? Fluocinolone Swelling  ? Sulfa Antibiotics Swelling and Nausea And Vomiting  ?  headache ?Swelling of feet, legs ?Swelling of feet, legs  ? Acetaminophen Itching and Swelling  ?  Pt states "some  swelling" ?Pt states "some swelling" ?Pt states "some swelling" ?Pt states "some swelling" ?Pt states "some swelling"  ? Amlodipine Besylate Swelling  ?  Swelling of feet, fluid retention  ? Amlodipine Besylate Swelling  ? Ciprofloxacin Nausea And Vomiting and Swelling  ?  Swelling of face, jaw, and lips  ? Duloxetine Swelling  ? Hydrocodone-Acetaminophen Itching  ? Hydrocodone-Acetaminophen Itching and Swelling  ? Ketamine Other (See Comments)  ? Metformin Other (See Comments)  ?  REACTION: GI UPSET ?Other reaction(s): Stomach  ?  Nsaids Other (See Comments)  ?  "HAS BLEEDING ULCERS"  ? Sulfamethoxazole Swelling  ?  Swelling of feet, legs  ? Valsartan Swelling  ? Lisinopril Swelling and Rash  ?  Oral swelling, and red streaks on arms/stomach  ? Tape Itching and Rash  ?  Paper tape can only be used on this patient  ? ? ?No outpatient medications have been marked as taking for the 01/12/22 encounter (Office Visit) with Mordecai Rasmussen, MD.  ? ? ?Objective: ?LMP  (LMP Unknown)  ? ?Physical Exam: ? ?General: Alert and oriented. and No acute distress. ?Gait: Normal gait. ? ?Evaluation of the right shoulder demonstrates no swelling.  No bruising.  Mild deformity is appreciated.  She does have some tenderness to palpation about the shoulder.  Forward flexion is limited to 70 degrees.  60 degrees of abduction at her side.  Sensations intact throughout the right hand.  Active motion intact in the right hand.  Sensations intact in the axillary nerve distribution.  2+ radial pulse.  No gross motion through the fracture sites. ? ?IMAGING: ?I personally ordered and reviewed the following images ? ?X-ray of the right shoulder demonstrates a proximal humerus fracture, with anterior displacement of the shaft in relation to the articular surface.  There is some impaction of the shaft into the articular portion.  Proximal humerus is comminuted.  There is interval callus formation.  Glenohumeral joint is reduced. ? ?Impression: Healed  right proximal humerus fracture, with anterior displacement and impaction of the humeral shaft  ? ? ?New Medications:  ?No orders of the defined types were placed in this encounter. ? ? ? ? ?Mordecai Rasmussen, MD ? ?

## 2022-01-16 ENCOUNTER — Telehealth: Payer: Self-pay

## 2022-01-16 NOTE — Telephone Encounter (Signed)
Patient left voicemail message stating that she wants to do her physical therapy at home. ?She would like a return call. ?Please give her a call at [phone] ?

## 2022-01-16 NOTE — Telephone Encounter (Signed)
Patient called and left voicemail at 11:05 am stating she needs pain medicine called in.  ?She hurts all the time and now it is red, inflamed and swelling  ? ?Please call her back at [phone] ?

## 2022-01-17 NOTE — Telephone Encounter (Signed)
Called and left VM for a call back with some clarification.  ?

## 2022-01-17 NOTE — Telephone Encounter (Signed)
Spoke with pt who states her PCP has sent her something in for her pain. Also, states and order was put in by [facility] so she no longer needs our assistance. Let her know to give [place] a call if she has any other questions or concerns.  ?

## 2022-01-18 LAB — HEMOGLOBIN A1C: Hemoglobin A1C: 5.6

## 2022-01-29 DIAGNOSIS — E1165 Type 2 diabetes mellitus with hyperglycemia: Secondary | ICD-10-CM | POA: Diagnosis not present

## 2022-01-29 DIAGNOSIS — F319 Bipolar disorder, unspecified: Secondary | ICD-10-CM | POA: Diagnosis not present

## 2022-01-29 DIAGNOSIS — L0291 Cutaneous abscess, unspecified: Secondary | ICD-10-CM | POA: Diagnosis not present

## 2022-01-29 DIAGNOSIS — L0390 Cellulitis, unspecified: Secondary | ICD-10-CM | POA: Diagnosis not present

## 2022-01-29 DIAGNOSIS — I10 Essential (primary) hypertension: Secondary | ICD-10-CM | POA: Diagnosis not present

## 2022-01-29 DIAGNOSIS — Z299 Encounter for prophylactic measures, unspecified: Secondary | ICD-10-CM | POA: Diagnosis not present

## 2022-02-01 DIAGNOSIS — L03114 Cellulitis of left upper limb: Secondary | ICD-10-CM | POA: Diagnosis not present

## 2022-02-06 NOTE — Patient Instructions (Signed)
Diabetes Mellitus Emergency Preparedness Plan ?A diabetes emergency preparedness plan is a checklist to make sure you have everything you need to manage your diabetes in case of an emergency, such as an evacuation, natural disaster, national security emergency, or pandemic lockdown. ?Managing your diabetes is something you have to do all day every day. The American Diabetes Association and the [employer] of Endocrinology both recommend putting together an emergency diabetes kit. Your kit should include important information and documents as well as all the supplies you will need to manage your diabetes for at least 1 week. Store it in a portable, Engineer, materials. The best time to start making your emergency kit is now. ?How to make your emergency kit ?Collect information and documents ?Include the following information and documents in your kit: ?The type of diabetes you have. ?A copy of your health insurance cards and photo ID. ?A list of all your other medical conditions, allergies, and surgeries. ?A list of all your medicines and doses with the contact information for your pharmacy. Ask your health care provider for a list of your current medicines. ?Any recent lab results, including your latest hemoglobin A1C (HbA1C). ?The make, model, and serial number of your insulin pump, if you use one. Also include contact information for the manufacturer. ?Contact information for people who should be notified in case of an emergency. Include your health care provider's name, address, and phone number. ?Collect diabetes care items ?Include the following diabetes care items in your kit: ?At least a 1-week supply of: ?Oral medicines. ?Insulin. ?Blood glucose testing supplies. These include testing strips, lancets, and extra batteries for your blood glucose monitor and pump. ?A charger for the continuous glucose monitor (CGM) receiver and pump. ?Any extra supplies needed for your CGM or pump. ?A supply of  glucagon, glucose tablets, juice, soda, or hard candy in case of hypoglycemia. ?Coolers or cold packs. ?A safe container for syringes, needles, and lancets. ? ?Other preparations ?Other things to consider doing as part of your emergency plan: ?Make sure that your mobile phone is charged and that you have an extra charger, cable, or batteries. ?Choose a meeting place for family members. ?Wear a medical alert or ID bracelet. ?If you have a child with diabetes, make sure your child's school has a copy of his or her emergency plan, including the name of the staff member who will assist your child. ?Where to find more information ?American Diabetes Association: www.diabetes.org ?Centers for Disease Control and Prevention: blogs.StoreMirror.com.cy ?Summary ?A diabetes emergency preparedness plan is a checklist to make sure you have everything you need in case of an emergency. ?Your kit should include important information and documents as well as all the supplies you will need to manage your condition for at least 1 week. ?Store your kit in a portable, Engineer, materials. ?The best time to start making your emergency kit is now. ?This information is not intended to replace advice given to you by your health care provider. Make sure you discuss any questions you have with your health care provider. ?Document Revised: 04/21/2020 Document Reviewed: 04/21/2020 ?Elsevier Patient Education ? [place]. ? ?

## 2022-02-07 ENCOUNTER — Ambulatory Visit (INDEPENDENT_AMBULATORY_CARE_PROVIDER_SITE_OTHER): Payer: Medicare HMO | Admitting: Nurse Practitioner

## 2022-02-07 ENCOUNTER — Encounter: Payer: Self-pay | Admitting: Nurse Practitioner

## 2022-02-07 VITALS — BP 151/77 | HR 77 | Ht 62.0 in | Wt 135.0 lb

## 2022-02-07 DIAGNOSIS — I10 Essential (primary) hypertension: Secondary | ICD-10-CM

## 2022-02-07 DIAGNOSIS — E1121 Type 2 diabetes mellitus with diabetic nephropathy: Secondary | ICD-10-CM | POA: Diagnosis not present

## 2022-02-07 DIAGNOSIS — E782 Mixed hyperlipidemia: Secondary | ICD-10-CM

## 2022-02-07 NOTE — Progress Notes (Signed)
? ?                                                    Endocrinology Follow Up Note  ?     02/07/2022, 11:48 AM ? ? ?Subjective:  ? ? Patient ID: Heather [name], female    DOB: 27-Mar-1951.  ?Heather [name] is being seen in follow up after being seen in consultation for management of currently uncontrolled symptomatic diabetes requested by  [name], MD. ? ? ?Past Medical History:  ?Diagnosis Date  ? Acute renal failure superimposed on stage 3 chronic kidney disease ([place]) 01/21/2016  ? Anemia   ? Anxiety   ? B12 deficiency   ? Brain tumor (benign) ([place]) 10/29/2020  ? Cellulitis and abscess   ? Abdomen and buttocks  ? CKD (chronic kidney disease) stage 3, GFR 30-59 ml/min ([facility])   ? Closed fracture of distal end of femur, unspecified fracture morphology, initial encounter ([place])   ? Coronary arteriosclerosis 07/15/2018  ? Crohn disease ([place])   ? Depression   ? Diabetic neuropathy ([place])   ? feet  ? DJD (degenerative joint disease)   ? Right forminal stenosis C4-5  ? Essential hypertension   ? Femur fracture, right ([place]) 03/30/2017  ? Folliculitis   ? GERD (gastroesophageal reflux disease)   ? Gout   ? Headache(784.0)   ? Hiatal hernia   ? History of blood transfusion   ? History of bronchitis   ? History of cardiac catheterization   ? No significant CAD 2012  ? History of kidney stones   ? surgery to remove  ? Insomnia   ? Irritable bowel syndrome   ? Mixed hyperlipidemia   ? Osteoarthritis   ? Palpitations   ? Pneumonia   ? Reflux esophagitis   ? Salmonella   ? Stroke [facility])   ? Tinnitus   ? Right  ? Type 2 diabetes mellitus ([place])   ? ? ?Past Surgical History:  ?Procedure Laterality Date  ? BACK SURGERY    ? c-spine surgery    ? 03/2007  ? CARDIAC CATHETERIZATION    ? CHOLECYSTECTOMY    ? COLONOSCOPY  04/21/2011; 05/29/11  ? 6/12: [place] - ?AVM at IC valve, inflammatory changes at [facility] valve [facility]); 7/12 - [place]; IC valve erosions, look chronic and probably Crohn's per path  ?  colonoscopy  2017  ? [place]: random biopsies normal. TI normal  ? ESOPHAGOGASTRODUODENOSCOPY  05/29/11  ? normal  ? ESOPHAGOGASTRODUODENOSCOPY N/A 01/21/2016  ? Dr. Michail Sermon: normal  ? EYE SURGERY Bilateral   ? cataracts removed  ? FEMUR IM NAIL Right 04/10/2018  ? Procedure: INTRAMEDULLARY (IM) NAIL FEMORAL;  Surgeon: Paralee Cancel, MD;  Location: [facility];  Service: Orthopedics;  Laterality: Right;  ? FRACTURE SURGERY    ? GIVENS CAPSULE STUDY  11/17/2020  ? HARDWARE REMOVAL Right 04/10/2018  ? Procedure: HARDWARE REMOVAL RIGHT DISTAL FEMUR;  Surgeon: Paralee Cancel, MD;  Location: [facility];  Service: Orthopedics;  Laterality: Right;  ? HERNIA REPAIR    ? IR FLUORO GUIDE CV LINE RIGHT  04/01/2017  ? IR US GUIDE VASC ACCESS RIGHT  04/01/2017  ? JOINT REPLACEMENT Right   ? hip  ? OPEN REDUCTION INTERNAL FIXATION (ORIF) DISTAL RADIAL FRACTURE Right 05/28/2019  ? Procedure: OPEN REDUCTION INTERNAL FIXATION (ORIF) DISTAL  RADIAL FRACTURE;  Surgeon: Verner Mould, MD;  Location: [place];  Service: Orthopedics;  Laterality: Right;  with block ?90 minutes  ? ORIF FEMUR FRACTURE Right 03/31/2017  ? Procedure: OPEN REDUCTION INTERNAL FIXATION (ORIF) DISTAL FEMUR FRACTURE;  Surgeon: Paralee Cancel, MD;  Location: [place];  Service: Orthopedics;  Laterality: Right;  ? removal of kidney stone    ? Right knee arthroscopic surgery    ? TONSILLECTOMY    ? TOTAL ABDOMINAL HYSTERECTOMY    ? ? ?Social History  ? ?Socioeconomic History  ? Marital status: Married  ?  Spouse name: Not on file  ? Number of children: 2  ? Years of education: Not on file  ? Highest education level: Not on file  ?Occupational History  ? Occupation: Disabled  ?Tobacco Use  ? Smoking status: Never  ? Smokeless tobacco: Never  ?Vaping Use  ? Vaping Use: Never used  ?Substance and Sexual Activity  ? Alcohol use: No  ? Drug use: No  ? Sexual activity: Not on file  ?  Comment: Hysterectomy  ?Other Topics Concern  ? Not on file  ?Social History Narrative  ? 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 Never smoker no alcohol or tobacco or drug use at this time  ? 2 glasses of tea daily   ?   ? 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   ? ?Social Determinants of Health  ? ?Financial Resource Strain: Not on file  ?Food Insecurity: Not on file  ?Transportation Needs: Not on file  ?Physical Activity: Not on file  ?Stress: Not on file  ?Social Connections: Not on file  ? ? ?Family History  ?Problem Relation Age of Onset  ? Diabetes Mother   ? Colon cancer Brother   ?     POSSIBLY. Patient states diagnosed at age 35, then later states age 21. unclear and limited historian  ? Esophageal cancer Neg Hx   ? Rectal cancer Neg Hx   ? Stomach cancer Neg Hx   ? ? ?Outpatient Encounter Medications as of 02/07/2022  ?Medication Sig  ? insulin detemir (LEVEMIR) 100 UNIT/ML injection Inject 5 Units into the skin daily.  ? acetaminophen (TYLENOL) 325 MG tablet Take 2 tablets (650 mg total) by mouth every 4 (four) hours.  ? albuterol (VENTOLIN HFA) 108 (90 Base) MCG/ACT inhaler Inhale 1-2 puffs into the lungs every 6 (six) hours as needed for shortness of breath or wheezing.  ? allopurinol (ZYLOPRIM) 100 MG tablet Take 100 mg by mouth daily.  ? Ascorbic Acid (VITAMIN C WITH ROSE HIPS) 500 MG tablet Take 500 mg by mouth every morning.  ? calcitRIOL (ROCALTROL) 0.25 MCG capsule Take 0.25 mcg by mouth 3 (three) times a week. Monday,Wed and Fri.  ? calcium carbonate (OS-CAL) 1250 (500 Ca) MG chewable tablet Chew 1 tablet by mouth 2 (two) times daily.  ? Cholecalciferol (VITAMIN D) 2000 units CAPS Take 2,000 Units by mouth every morning.   ? cyanocobalamin (,VITAMIN B-12,) 1000 MCG/ML injection Inject 1,000 mcg into the muscle every 30 (thirty) days.    ? diazepam (VALIUM) 5 MG tablet Take 1 tablet (5 mg total) by mouth every 12 (twelve) hours as needed for anxiety.  ? dicyclomine (BENTYL) 20 MG tablet Take 1 tablet (20  mg total) by mouth in the morning and at bedtime.  ? diphenhydrAMINE (BENADRYL) 2 %  cream Apply topically 3 (three) times daily as needed for itching.  ? epoetin alfa-epbx (RETACRIT) 3000 UNIT/ML injection Inject 3,000 Units into the skin every 14 (fourteen) days.  ? esomeprazole (NEXIUM) 40 MG capsule Take 40 mg by mouth daily before breakfast.    ? fenofibrate (TRICOR) 145 MG tablet Take 1 tablet (145 mg total) by mouth daily.  ? fish oil-omega-3 fatty acids 1000 MG capsule Take 1 g by mouth in the morning and at bedtime.  ? furosemide (LASIX) 20 MG tablet Take 1 tablet (20 mg total) by mouth every other day.  ? gabapentin (NEURONTIN) 300 MG capsule Take 1 capsule (300 mg total) by mouth at bedtime.  ? levETIRAcetam (KEPPRA) 500 MG tablet Take 500 mg by mouth 2 (two) times daily.  ? metoprolol succinate (TOPROL-XL) 50 MG 24 hr tablet Take 50 mg by mouth daily. Take with or immediately following a meal.  ? ondansetron (ZOFRAN) 4 MG tablet Take 1 tablet (4 mg total) by mouth every 6 (six) hours as needed for nausea.  ? polyethylene glycol (MIRALAX / GLYCOLAX) 17 g packet Take 17 g by mouth daily.  ? sodium bicarbonate 650 MG tablet Take 650 mg by mouth 3 (three) times daily.  ? venlafaxine XR (EFFEXOR-XR) 75 MG 24 hr capsule Take 1 capsule (75 mg total) by mouth daily.  ? [DISCONTINUED] insulin glargine-yfgn (SEMGLEE) 100 UNIT/ML injection Inject 10 Units into the skin at bedtime.  ? ?No facility-administered encounter medications on file as of 02/07/2022.  ? ? ?ALLERGIES: ?Allergies  ?Allergen Reactions  ? Losartan Swelling, Rash and Nausea And Vomiting  ? Quinolones Swelling, Nausea Only and Nausea And Vomiting  ?  Swelling of face, jaw, and lips ?Swelling of face, jaw, and lips  ? Duloxetine Hcl Swelling  ? Fluocinolone Swelling  ? Sulfa Antibiotics Swelling and Nausea And Vomiting  ?  headache ?Swelling of feet, legs ?Swelling of feet, legs  ? Acetaminophen Itching and Swelling  ?  Pt states "some swelling" ?Pt  states "some swelling" ?Pt states "some swelling" ?Pt states "some swelling" ?Pt states "some swelling"  ? Amlodipine Besylate Swelling  ?  Swelling of feet, fluid retention  ? Amlodipine Besylate Swelling

## 2022-02-08 ENCOUNTER — Telehealth: Payer: Self-pay | Admitting: Nurse Practitioner

## 2022-02-08 NOTE — Telephone Encounter (Signed)
Have her lower it further to 3 units.

## 2022-02-08 NOTE — Telephone Encounter (Signed)
Pt states she was told to call with any low BG readings.  ? ?Last Night she took 5 units of Levemir. BG last night was 279. This a.m. BG was 64 and at lunchtime BG was 105 ?

## 2022-02-08 NOTE — Telephone Encounter (Signed)
Pt notified to lower to 3 units and agrees. She will call back with any low BG readings ?

## 2022-02-13 DIAGNOSIS — I10 Essential (primary) hypertension: Secondary | ICD-10-CM | POA: Diagnosis not present

## 2022-02-13 DIAGNOSIS — R110 Nausea: Secondary | ICD-10-CM | POA: Diagnosis not present

## 2022-02-13 DIAGNOSIS — Z299 Encounter for prophylactic measures, unspecified: Secondary | ICD-10-CM | POA: Diagnosis not present

## 2022-02-13 DIAGNOSIS — N390 Urinary tract infection, site not specified: Secondary | ICD-10-CM | POA: Diagnosis not present

## 2022-02-13 DIAGNOSIS — I509 Heart failure, unspecified: Secondary | ICD-10-CM | POA: Diagnosis not present

## 2022-02-13 DIAGNOSIS — Z789 Other specified health status: Secondary | ICD-10-CM | POA: Diagnosis not present

## 2022-02-14 NOTE — Progress Notes (Addendum)
Capri, Layal A. ([id number]) ?Visit Report for 02/20/2022 ?Allergy List Details ?Patient Name: Heather [name], [name] ?Date of Service: 02/20/2022 12:45 PM ?Medical Record Number: [id number] ?Patient Account Number: [id number] ?Date of Birth/Sex: 02/29/1951 (71 y.o. Female) ?Treating RN: Levora Dredge ?Primary Care Keirstin Musil: [name] Other Clinician: ?Referring Roshon Duell: [name] ?Treating Devaris Quirk/Extender: Jeri Cos ?Weeks in Treatment: 0 ?Allergies ?Active Allergies ?losartan ?Reaction: swelling, rash, nausea vomiting ?Quinolones ?Reaction: swelling, nausea vomiting ?duloxetine ?Reaction: swelling ?fluocinolone acetonide ?Reaction: swelling ?Sulfa (Sulfonamide Antibiotics) ?Reaction: swelling, nausea vomiting ?acetaminophen ?Reaction: itching, swelling ?amlodipine ?Reaction: swelling ?ciprofloxacin ?Reaction: swelling ?hydrocodone ?Reaction: itching swelling ?ketamine ?metformin ?Reaction: GI upset ?NSAIDS (Non-Steroidal Anti-Inflammatory Drug) ?Reaction: has bleeding ulcers ?sulfamethoxazole ?Reaction: swelling ?valsartan ?Reaction: swelling ?lisinopril ?Reaction: swelling, rash ?adhesive tape ?Reaction: itching, rash ?Allergy Notes ?Lehnen, Otis A. ([id number]) ?Electronic Signature(s) ?Signed: 02/14/2022 2:01:23 PM By: Levora Dredge ?Entered By: Levora Dredge on 02/14/2022 14:01:22 ?Heather [name], [name] ([id number]) ?-------------------------------------------------------------------------------- ?Arrival Information Details ?Patient Name: Heather [name], [name] ?Date of Service: 02/20/2022 12:45 PM ?Medical Record Number: [id number] ?Patient Account Number: [id number] ?Date of Birth/Sex: Nov 01, 1950 (71 y.o. Female) ?Treating RN: Levora Dredge ?Primary Care Chanc Kervin: [name] Other Clinician: ?Referring Tennelle Taflinger: [name] ?Treating Larayah Clute/Extender: Jeri Cos ?Weeks in Treatment: 0 ?Visit Information ?Patient Arrived: Ambulatory ?Arrival Time: 12:44 ?Accompanied By: self ?Transfer  Assistance: None ?Patient Identification Verified: Yes ?Secondary Verification Process Completed: Yes ?Electronic Signature(s) ?Signed: 02/20/2022 4:54:44 PM By: Levora Dredge ?Entered By: Levora Dredge on 02/20/2022 12:44:48 ?Heather [name], [name] ([id number]) ?-------------------------------------------------------------------------------- ?Clinic Level of Care Assessment Details ?Patient Name: Heather [name], [name] ?Date of Service: 02/20/2022 12:45 PM ?Medical Record Number: [id number] ?Patient Account Number: [id number] ?Date of Birth/Sex: Sep 30, 1951 (71 y.o. Female) ?Treating RN: Levora Dredge ?Primary Care Lucinda Spells: [name] Other Clinician: ?Referring Audon Heymann: [name] ?Treating Isidor Bromell/Extender: Jeri Cos ?Weeks in Treatment: 0 ?Clinic Level of Care Assessment Items ?TOOL 2 Quantity Score ?'[]'$  - Use when only an EandM is performed on the INITIAL visit 0 ?ASSESSMENTS - Nursing Assessment / Reassessment ?X - General Physical Exam (combine w/ comprehensive assessment (listed just below) when performed on new ?1 20 ?pt. evals) ?X- 1 25 ?Comprehensive Assessment (HX, ROS, Risk Assessments, Wounds Hx, etc.) ?ASSESSMENTS - Wound and Skin Assessment / Reassessment ?X - Simple Wound Assessment / Reassessment - one wound 1 5 ?'[]'$  - 0 ?Complex Wound Assessment / Reassessment - multiple wounds ?'[]'$  - 0 ?Dermatologic / Skin Assessment (not related to wound area) ?ASSESSMENTS - Ostomy and/or Continence Assessment and Care ?'[]'$  - Incontinence Assessment and Management 0 ?'[]'$  - 0 ?Ostomy Care Assessment and Management (repouching, etc.) ?PROCESS - Coordination of Care ?X - Simple Patient / Family Education for ongoing care 1 15 ?'[]'$  - 0 ?Complex (extensive) Patient / Family Education for ongoing care ?X- 1 10 ?Staff obtains Consents, Records, Test Results / Process Orders ?'[]'$  - 0 ?Staff telephones HHA, Nursing Homes / Clarify orders / etc ?'[]'$  - 0 ?Routine Transfer to another Facility (non-emergent condition) ?'[]'$  -  0 ?Routine Hospital Admission (non-emergent condition) ?X- 1 15 ?New Admissions / Biomedical engineer / Ordering NPWT, Apligraf, etc. ?'[]'$  - 0 ?Emergency Hospital Admission (emergent condition) ?X- 1 10 ?Simple Discharge Coordination ?'[]'$  - 0 ?Complex (extensive) Discharge Coordination ?PROCESS - Special Needs ?'[]'$  - Pediatric / Minor Patient Management 0 ?'[]'$  - 0 ?Isolation Patient Management ?'[]'$  - 0 ?Hearing / Language / Visual special needs ?'[]'$  - 0 ?Assessment of Community assistance (transportation, D/C planning, etc.) ?'[]'$  - 0 ?Additional assistance / Altered mentation ?'[]'$  - 0 ?Support Surface(s) Assessment (bed,  cushion, seat, etc.) ?INTERVENTIONS - Wound Cleansing / Measurement ?X - Wound Imaging (photographs - any number of wounds) 1 5 ?'[]'$  - 0 ?Wound Tracing (instead of photographs) ?X- 1 5 ?Simple Wound Measurement - one wound ?'[]'$  - 0 ?Complex Wound Measurement - multiple wounds ?Heather [name], [name] ([id number]) ?X- 1 5 ?Simple Wound Cleansing - one wound ?'[]'$  - 0 ?Complex Wound Cleansing - multiple wounds ?INTERVENTIONS - Wound Dressings ?X - Small Wound Dressing one or multiple wounds 1 10 ?'[]'$  - 0 ?Medium Wound Dressing one or multiple wounds ?'[]'$  - 0 ?Large Wound Dressing one or multiple wounds ?'[]'$  - 0 ?Application of Medications - injection ?INTERVENTIONS - Miscellaneous ?'[]'$  - External ear exam 0 ?'[]'$  - 0 ?Specimen Collection (cultures, biopsies, blood, body fluids, etc.) ?'[]'$  - 0 ?Specimen(s) / Culture(s) sent or taken to Lab for analysis ?'[]'$  - 0 ?Patient Transfer (multiple staff / Civil Service fast streamer / Similar devices) ?'[]'$  - 0 ?Simple Staple / Suture removal (25 or less) ?'[]'$  - 0 ?Complex Staple / Suture removal (26 or more) ?'[]'$  - 0 ?Hypo / Hyperglycemic Management (close monitor of Blood Glucose) ?'[]'$  - 0 ?Ankle / Brachial Index (ABI) - do not check if billed separately ?Has the patient been seen at the hospital within the last three years: Yes ?Total Score: 125 ?Level Of Care: New/Established -  Level ?4 ?Electronic Signature(s) ?Signed: 02/20/2022 4:54:44 PM By: Levora Dredge ?Entered By: Levora Dredge on 02/20/2022 14:03:43 ?Heather [name], [name] ([id number]) ?-------------------------------------------------------------------------------- ?Encounter Discharge Information Details ?Patient Name: Heather [name], [name] ?Date of Service: 02/20/2022 12:45 PM ?Medical Record Number: [id number] ?Patient Account Number: [id number] ?Date of Birth/Sex: 10-14-1951 (71 y.o. Female) ?Treating RN: Levora Dredge ?Primary Care Martisha Toulouse: [name] Other Clinician: ?Referring Pinky Ravan: [name] ?Treating Kawanda Drumheller/Extender: Jeri Cos ?Weeks in Treatment: 0 ?Encounter Discharge Information Items Post Procedure Vitals ?Discharge Condition: Stable ?Temperature (?F): 97.5 ?Ambulatory Status: Ambulatory ?Pulse (bpm): 84 ?Discharge Destination: Home ?Respiratory Rate (breaths/min): 18 ?Transportation: Private Auto ?Blood Pressure (mmHg): 197/81 ?Accompanied By: husband ?Schedule Follow-up Appointment: Yes ?Clinical Summary of Care: Patient Declined ?Notes ?PA Stone made aware of increased BP ?Electronic Signature(s) ?Signed: 02/20/2022 2:05:25 PM By: Levora Dredge ?Entered By: Levora Dredge on 02/20/2022 14:05:25 ?Heather [name], [name] ([id number]) ?-------------------------------------------------------------------------------- ?Lower Extremity Assessment Details ?Patient Name: Heather [name], [name] ?Date of Service: 02/20/2022 12:45 PM ?Medical Record Number: [id number] ?Patient Account Number: [id number] ?Date of Birth/Sex: 01-Jul-1951 (71 y.o. Female) ?Treating RN: Levora Dredge ?Primary Care Bettye Sitton: [name] Other Clinician: ?Referring Samamtha Tiegs: [name] ?Treating Annarose Ouellet/Extender: Jeri Cos ?Weeks in Treatment: 0 ?Electronic Signature(s) ?Signed: 02/20/2022 4:54:44 PM By: Levora Dredge ?Entered By: Levora Dredge on 02/20/2022 12:59:05 ?Dimitrov, Bryahna A.  ([id number]) ?-------------------------------------------------------------------------------- ?Multi Wound Chart Details ?Patient Name: LIV, RALLIS A. ?Date of Service: 02/20/2022 12:45 PM ?Medical Record Number: [id number] ?Patient Account Number: [id number] ?

## 2022-02-20 ENCOUNTER — Encounter: Payer: Medicare HMO | Attending: Physician Assistant | Admitting: Physician Assistant

## 2022-02-20 DIAGNOSIS — E11622 Type 2 diabetes mellitus with other skin ulcer: Secondary | ICD-10-CM | POA: Insufficient documentation

## 2022-02-20 DIAGNOSIS — E1122 Type 2 diabetes mellitus with diabetic chronic kidney disease: Secondary | ICD-10-CM | POA: Insufficient documentation

## 2022-02-20 DIAGNOSIS — N183 Chronic kidney disease, stage 3 unspecified: Secondary | ICD-10-CM | POA: Diagnosis not present

## 2022-02-20 DIAGNOSIS — T801XXA Vascular complications following infusion, transfusion and therapeutic injection, initial encounter: Secondary | ICD-10-CM | POA: Diagnosis not present

## 2022-02-20 DIAGNOSIS — I129 Hypertensive chronic kidney disease with stage 1 through stage 4 chronic kidney disease, or unspecified chronic kidney disease: Secondary | ICD-10-CM | POA: Insufficient documentation

## 2022-02-20 DIAGNOSIS — L98492 Non-pressure chronic ulcer of skin of other sites with fat layer exposed: Secondary | ICD-10-CM | POA: Diagnosis not present

## 2022-02-21 NOTE — Progress Notes (Signed)
Caffee, Maryalice A. ([id number]) ?Visit Report for 02/20/2022 ?Abuse Risk Screen Details ?Patient Name: Heather [name], [name] ?Date of Service: 02/20/2022 12:45 PM ?Medical Record Number: [id number] ?Patient Account Number: [id number] ?Date of Birth/Sex: 1951-05-18 (71 y.o. Female) ?Treating RN: Levora Dredge ?Primary Care Miraj Truss: [name] Other Clinician: ?Referring Mikala Podoll: [name] ?Treating Zamyah Wiesman/Extender: Jeri Cos ?Weeks in Treatment: 0 ?Abuse Risk Screen Items ?Answer ?ABUSE RISK SCREEN: ?Has anyone close to you tried to hurt or harm you recentlyo No ?Do you feel uncomfortable with anyone in your familyo No ?Has anyone forced you do things that you didnot want to doo No ?Electronic Signature(s) ?Signed: 02/20/2022 4:54:44 PM By: Levora Dredge ?Entered By: Levora Dredge on 02/20/2022 12:55:56 ?Bar, Adri A. ([id number]) ?-------------------------------------------------------------------------------- ?Activities of Daily Living Details ?Patient Name: Heather [name], [name] ?Date of Service: 02/20/2022 12:45 PM ?Medical Record Number: [id number] ?Patient Account Number: [id number] ?Date of Birth/Sex: Feb 17, 1951 (71 y.o. Female) ?Treating RN: Levora Dredge ?Primary Care Makiyah Zentz: [name] Other Clinician: ?Referring Shaye Elling: [name] ?Treating Waldon Sheerin/Extender: Jeri Cos ?Weeks in Treatment: 0 ?Activities of Daily Living Items ?Answer ?Activities of Daily Living (Please select one for each item) ?Drive Automobile Need Assistance ?Take Medications Completely Able ?Use Telephone Completely Able ?Care for Appearance Completely Able ?Use Toilet Completely Able ?Bath / Shower Completely Able ?Dress Self Completely Able ?Feed Self Completely Able ?Walk Completely Able ?Get In / Out Bed Completely Able ?Housework Completely Able ?Prepare Meals Completely Able ?Handle Money Completely Able ?Shop for Self Completely Able ?Electronic Signature(s) ?Signed: 02/20/2022 4:54:44 PM By: Levora Dredge ?Entered By: Levora Dredge on 02/20/2022 12:56:23 ?Peth, Cylee A. ([id number]) ?-------------------------------------------------------------------------------- ?Education Screening Details ?Patient Name: Heather [name], [name] ?Date of Service: 02/20/2022 12:45 PM ?Medical Record Number: [id number] ?Patient Account Number: [id number] ?Date of Birth/Sex: 1950/11/09 (71 y.o. Female) ?Treating RN: Levora Dredge ?Primary Care Dondi Aime: [name] Other Clinician: ?Referring Gentle Hoge: [name] ?Treating Vergie Zahm/Extender: Jeri Cos ?Weeks in Treatment: 0 ?Learning Preferences/Education Level/Primary Language ?Learning Preference: Explanation, Demonstration, Video, Communication Board, Printed Material ?Preferred Language: English ?Cognitive Barrier ?Language Barrier: No ?Translator Needed: No ?Memory Deficit: No ?Emotional Barrier: No ?Cultural/Religious Beliefs Affecting Medical Care: No ?Physical Barrier ?Impaired Vision: No ?Impaired Hearing: No ?Decreased Hand dexterity: No ?Knowledge/Comprehension ?Knowledge Level: High ?Comprehension Level: High ?Ability to understand written instructions: High ?Ability to understand verbal instructions: High ?Motivation ?Anxiety Level: Calm ?Cooperation: Cooperative ?Education Importance: Acknowledges Need ?Interest in Health Problems: Asks Questions ?Perception: Coherent ?Willingness to Engage in Self-Management ?High ?Activities: ?Readiness to Engage in Self-Management ?High ?Activities: ?Electronic Signature(s) ?Signed: 02/20/2022 4:54:44 PM By: Levora Dredge ?Entered By: Levora Dredge on 02/20/2022 12:57:04 ?Pigman, Kynzlie A. ([id number]) ?-------------------------------------------------------------------------------- ?Fall Risk Assessment Details ?Patient Name: Heather [name], [name] ?Date of Service: 02/20/2022 12:45 PM ?Medical Record Number: [id number] ?Patient Account Number: [id number] ?Date of Birth/Sex: 1951-02-08 (71 y.o. Female) ?Treating RN:  Levora Dredge ?Primary Care Paulyne Mooty: [name] Other Clinician: ?Referring Rashonda Warrior: [name] ?Treating Aneita Kiger/Extender: Jeri Cos ?Weeks in Treatment: 0 ?Fall Risk Assessment Items ?Have you had 2 or more falls in the last 12 monthso 0 Yes ?Have you had any fall that resulted in injury in the last 12 monthso 0 Yes ?FALLS RISK SCREEN ?History of falling - immediate or within 3 months 25 Yes ?Secondary diagnosis (Do you have 2 or more medical diagnoseso) 0 No ?Ambulatory aid ?None/bed rest/wheelchair/nurse 0 Yes ?Crutches/cane/walker 0 No ?Furniture 0 No ?Intravenous therapy Access/Saline/Heparin Lock 0 No ?Gait/Transferring ?Normal/ bed rest/ wheelchair 0 Yes ?Weak (short steps with or without shuffle, stooped but able to  lift head while walking, may ?0 No ?seek support from furniture) ?Impaired (short steps with shuffle, may have difficulty arising from chair, head down, impaired ?0 No ?balance) ?Mental Status ?Oriented to own ability 0 Yes ?Electronic Signature(s) ?Signed: 02/20/2022 4:54:44 PM By: Levora Dredge ?Entered By: Levora Dredge on 02/20/2022 12:57:34 ?Rodden, Jennife A. ([id number]) ?-------------------------------------------------------------------------------- ?Foot Assessment Details ?Patient Name: Heather [name], [name] ?Date of Service: 02/20/2022 12:45 PM ?Medical Record Number: [id number] ?Patient Account Number: [id number] ?Date of Birth/Sex: 1951/03/05 (71 y.o. Female) ?Treating RN: Levora Dredge ?Primary Care Tryone Kille: [name] Other Clinician: ?Referring Travia Onstad: [name] ?Treating Snigdha Howser/Extender: Jeri Cos ?Weeks in Treatment: 0 ?Foot Assessment Items ?Site Locations ?+ = Sensation present, - = Sensation absent, C = Callus, U = Ulcer ?R = Redness, W = Warmth, M = Maceration, PU = Pre-ulcerative lesion ?F = Fissure, S = Swelling, D = Dryness ?Assessment ?Right: Left: ?Other Deformity: No No ?Prior Foot Ulcer: No No ?Prior Amputation: No No ?Charcot Joint: No  No ?Ambulatory Status: ?Gait: ?Notes ?not needed due to location of wound to left wrist ?Electronic Signature(s) ?Signed: 02/20/2022 4:54:44 PM By: Levora Dredge ?Entered By: Levora Dredge on 02/20/2022 12:58:53 ?Bulnes, Scottie A. ([id number]) ?-------------------------------------------------------------------------------- ?Nutrition Risk Screening Details ?Patient Name: Heather [name], [name] ?Date of Service: 02/20/2022 12:45 PM ?Medical Record Number: [id number] ?Patient Account Number: [id number] ?Date of Birth/Sex: October 04, 1951 (71 y.o. Female) ?Treating RN: Levora Dredge ?Primary Care Selina Tapper: [name] Other Clinician: ?Referring Ryllie Nieland: [name] ?Treating Dejha King/Extender: Jeri Cos ?Weeks in Treatment: 0 ?Height (in): 63 ?Weight (lbs): 131 ?Body Mass Index (BMI): 23.2 ?Nutrition Risk Screening Items ?Score Screening ?NUTRITION RISK SCREEN: ?I have an illness or condition that made me change the kind and/or amount of food I eat 0 No ?I eat fewer than two meals per day 0 No ?I eat few fruits and vegetables, or milk products 0 No ?I have three or more drinks of beer, liquor or wine almost every day 0 No ?I have tooth or mouth problems that make it hard for me to eat 0 No ?I don't always have enough money to buy the food I need 0 No ?I eat alone most of the time 0 No ?I take three or more different prescribed or over-the-counter drugs a day 0 No ?Without wanting to, I have lost or gained 10 pounds in the last six months 0 No ?I am not always physically able to shop, cook and/or feed myself 0 No ?Nutrition Protocols ?Good Risk Protocol ?Moderate Risk Protocol 0 Provide education on nutrition ?High Risk Proctocol ?Risk Level: Good Risk ?Score: 0 ?Notes ?patient states not much of an appetite since discharge from hospital and states nausea pt states have lost 5 lbs ?Electronic Signature(s) ?Signed: 02/20/2022 4:54:44 PM By: Levora Dredge ?Entered By: Levora Dredge on 02/20/2022 12:58:36 ?

## 2022-02-21 NOTE — Progress Notes (Addendum)
[name], [name] ([id number]) ?Visit Report for 02/20/2022 ?Chief Complaint Document Details ?Patient Name: Heather [name], [name] ?Date of Service: 02/20/2022 12:45 PM ?Medical Record Number: [id number] ?Patient Account Number: [id number] ?Date of Birth/Sex: 10/01/1951 (71 y.o. Female) ?Treating RN: Levora Dredge ?Primary Care Provider: [name] Other Clinician: ?Referring Provider: [name] ?Treating Provider/Extender: Jeri Cos ?Weeks in Treatment: 0 ?Information Obtained from: Patient ?Chief Complaint ?Left wrist ulcer s/p infiltration ?Electronic Signature(s) ?Signed: 02/20/2022 1:24:48 PM By: Worthy Keeler PA-C ?Entered By: Worthy Keeler on 02/20/2022 13:24:48 ?[name], [name] ([id number]) ?-------------------------------------------------------------------------------- ?Debridement Details ?Patient Name: Heather [name], [name] ?Date of Service: 02/20/2022 12:45 PM ?Medical Record Number: [id number] ?Patient Account Number: [id number] ?Date of Birth/Sex: [birth year] (71 y.o. Female) ?Treating RN: Levora Dredge ?Primary Care Provider: [name] Other Clinician: ?Referring Provider: [name] ?Treating Provider/Extender: Jeri Cos ?Weeks in Treatment: 0 ?Debridement Performed for ?Wound #1 Left,Midline Wrist ?Assessment: ?Performed By: Physician Tommie Sams., PA-C ?Debridement Type: Chemical/Enzymatic/Mechanical ?Agent Used: saline gauze ?Level of Consciousness (Pre- ?Awake and Alert ?procedure): ?Pre-procedure Verification/Time Out ?Yes - 13:30 ?Taken: ?Instrument: ?Other : saline gauze ?Bleeding: Minimum ?Hemostasis Achieved: Pressure ?Response to Treatment: Procedure was tolerated well ?Level of Consciousness (Post- ?Awake and Alert ?procedure): ?Post Debridement Measurements of Total Wound ?Length: (cm) 1.1 ?Width: (cm) 2 ?Depth: (cm) 0.1 ?Volume: (cm?) 0.173 ?Character of Wound/Ulcer Post Debridement: Stable ?Post Procedure Diagnosis ?Same as Pre-procedure ?Electronic  Signature(s) ?Signed: 02/20/2022 2:01:28 PM By: Levora Dredge ?Signed: 02/20/2022 6:11:27 PM By: Worthy Keeler PA-C ?Entered By: Levora Dredge on 02/20/2022 14:01:27 ?[name], [name] ([id number]) ?-------------------------------------------------------------------------------- ?HPI Details ?Patient Name: Heather [name], [name] ?Date of Service: 02/20/2022 12:45 PM ?Medical Record Number: [id number] ?Patient Account Number: [id number] ?Date of Birth/Sex: 10-17-1951 (71 y.o. Female) ?Treating RN: Levora Dredge ?Primary Care Provider: [name] Other Clinician: ?Referring Provider: [name] ?Treating Provider/Extender: Jeri Cos ?Weeks in Treatment: 0 ?History of Present Illness ?HPI Description: 02-21-2022 upon evaluation today patient presents for initial evaluation here in the clinic. She was actually in the hospital ?November 25, 2021. Subsequently she had a fall where she suffered a right shoulder dislocation and humeral head fracture. She was initially treated ?in that regard but was in the hospital for time with all this. During the time that she was in the hospital she did have an infiltration of the IV in her ?hand and subsequently this caused an open wound. This was treated originally with bacitracin and she has been placed on Keflex. Most recently ?she was on doxycycline April 3 that is complete at this point. She is diabetic her most recent hemoglobin A1c was 5.6 that was this month April ?2023. With that being said overall I feel like that she is definitely making progress and the wound looks pretty darn good today compared to what ?it was even in the pictures from the hospital where she had significant eschar covering the majority of the surface of the wound. ?Patient does have a history of diabetes mellitus type 2, hypertension, and chronic kidney disease stage III. ?Electronic Signature(s) ?Signed: 02/21/2022 4:11:55 PM By: Worthy Keeler PA-C ?Previous Signature: 02/20/2022 5:49:15 PM  Version By: Worthy Keeler PA-C ?Entered By: Worthy Keeler on 02/21/2022 16:11:54 ?[name], [name] ([id number]) ?-------------------------------------------------------------------------------- ?Physical Exam Details ?Patient Name: Heather [name], [name] ?Date of Service: 02/20/2022 12:45 PM ?Medical Record Number: [id number] ?Patient Account Number: [id number] ?Date of Birth/Sex: 15-Mar-1951 (71 y.o. Female) ?Treating RN: Levora Dredge ?Primary Care Provider: [name] Other Clinician: ?Referring Provider: [name] ?Treating Provider/Extender: Heather [name] ?Weeks in Treatment: 0 ?Constitutional ?patient is hypertensive.. pulse regular and within target range for patient.Marland Kitchen respirations regular, non-labored and within target range for patient.. ?temperature within target range for patient.. Well-nourished and well-hydrated in no acute distress. ?Eyes ?conjunctiva clear no eyelid edema noted. pupils equal round and reactive to light and accommodation. ?Ears, Nose, Mouth, and Throat ?no gross abnormality of ear auricles or external auditory canals. normal hearing noted during conversation. mucus membranes moist. ?Respiratory ?normal breathing without difficulty. ?Musculoskeletal ?normal gait and posture. no significant deformity or arthritic changes, no loss or range of motion, no clubbing. ?Psychiatric ?this patient is able to make decisions and demonstrates good insight into disease process. Alert and Oriented x 3. pleasant and cooperative. ?Notes ?Upon inspection patient's wound bed actually showed signs of actually good granulation and epithelization at this point. I do not see any signs of ?infection and to be honest there really did not even appear to be any need for a terribly aggressive sharp debridement today. I was able to clear ?away some of the necrotic debris without complication just using saline and gauze and again overall this seems to be quite well and no sharp ?debridement was even  necessary. ?Electronic Signature(s) ?Signed: 02/21/2022 4:13:10 PM By: Worthy Keeler PA-C ?Entered By: Worthy Keeler on 02/21/2022 16:13:10 ?[name], [name] ([id number]) ?-------------------------------------------------------------------------------- ?Physician Orders Details ?Patient Name: Heather [name], [name] ?Date of Service: 02/20/2022 12:45 PM ?Medical Record Number: [id number] ?Patient Account Number: [id number] ?Date of Birth/Sex: 09/26/1951 (71 y.o. Female) ?Treating RN: Levora Dredge ?Primary Care Provider: [name] Other Clinician: ?Referring Provider: [name] ?Treating Provider/Extender: Jeri Cos ?Weeks in Treatment: 0 ?Verbal / Phone Orders: No ?Diagnosis Coding ?ICD-10 Coding ?Code Description ?T80.1XXA Vascular complications following infusion, transfusion and therapeutic injection, initial encounter ?L98.492 Non-pressure chronic ulcer of skin of other sites with fat layer exposed ?E11.622 Type 2 diabetes mellitus with other skin ulcer ?I10 Essential (primary) hypertension ?N18.30 Chronic kidney disease, stage 3 unspecified ?Follow-up Appointments ?o Return Appointment in 2 weeks. - due to scheduling ?o Nurse Visit as needed ?Home Health ?[facility] , pt states sees RN Aimee ?o [place] for wound care. May utilize formulary equivalent dressing for wound treatment orders unless ?otherwise specified. Home Health Nurse may visit [facility] to address patientos wound care needs. ?o Scheduled days for dressing changes to be completed; exception, patient has scheduled wound care visit that day. ?o **Please direct any NON-WOUND related issues/requests for orders to patient's Primary Care Physician. **If current ?dressing causes regression in wound condition, may D/C ordered dressing product/s and apply Normal Saline Moist ?Dressing daily until next [place] or Other MD appointment. **Notify [facility] of  regression in ?wound condition at [phone]. ?Bathing/ Shower/ Hygiene ?o Clean wound with Normal Saline or wound cleanser. ?o Wash wounds with antibacterial soap and water. ?o May shower with wound dressing protected with water

## 2022-02-27 DIAGNOSIS — R808 Other proteinuria: Secondary | ICD-10-CM | POA: Diagnosis not present

## 2022-02-27 DIAGNOSIS — E8722 Chronic metabolic acidosis: Secondary | ICD-10-CM | POA: Diagnosis not present

## 2022-02-27 DIAGNOSIS — Z1159 Encounter for screening for other viral diseases: Secondary | ICD-10-CM | POA: Diagnosis not present

## 2022-02-27 DIAGNOSIS — N189 Chronic kidney disease, unspecified: Secondary | ICD-10-CM | POA: Diagnosis not present

## 2022-02-27 DIAGNOSIS — I129 Hypertensive chronic kidney disease with stage 1 through stage 4 chronic kidney disease, or unspecified chronic kidney disease: Secondary | ICD-10-CM | POA: Diagnosis not present

## 2022-02-27 DIAGNOSIS — E559 Vitamin D deficiency, unspecified: Secondary | ICD-10-CM | POA: Diagnosis not present

## 2022-02-27 DIAGNOSIS — E1122 Type 2 diabetes mellitus with diabetic chronic kidney disease: Secondary | ICD-10-CM | POA: Diagnosis not present

## 2022-03-01 DIAGNOSIS — E1165 Type 2 diabetes mellitus with hyperglycemia: Secondary | ICD-10-CM | POA: Diagnosis not present

## 2022-03-01 DIAGNOSIS — E871 Hypo-osmolality and hyponatremia: Secondary | ICD-10-CM | POA: Diagnosis not present

## 2022-03-01 DIAGNOSIS — N189 Chronic kidney disease, unspecified: Secondary | ICD-10-CM | POA: Diagnosis not present

## 2022-03-01 DIAGNOSIS — R808 Other proteinuria: Secondary | ICD-10-CM | POA: Diagnosis not present

## 2022-03-01 DIAGNOSIS — E1122 Type 2 diabetes mellitus with diabetic chronic kidney disease: Secondary | ICD-10-CM | POA: Diagnosis not present

## 2022-03-01 DIAGNOSIS — I5032 Chronic diastolic (congestive) heart failure: Secondary | ICD-10-CM | POA: Diagnosis not present

## 2022-03-01 DIAGNOSIS — I129 Hypertensive chronic kidney disease with stage 1 through stage 4 chronic kidney disease, or unspecified chronic kidney disease: Secondary | ICD-10-CM | POA: Diagnosis not present

## 2022-03-05 ENCOUNTER — Ambulatory Visit: Payer: Medicare HMO | Admitting: Internal Medicine

## 2022-03-06 ENCOUNTER — Telehealth: Payer: Self-pay | Admitting: Nurse Practitioner

## 2022-03-06 ENCOUNTER — Ambulatory Visit: Payer: Medicare HMO | Admitting: Physician Assistant

## 2022-03-06 NOTE — Telephone Encounter (Signed)
She said that she is taking 4-5 units of Levemir at night. She will increase to 10 units ?

## 2022-03-06 NOTE — Telephone Encounter (Signed)
How much Semglee/Levemir is she taking? I had reduced it at one point cause her glucose readings were low.  She needs to increase back to 10 units nightly for now and start monitoring glucose 4 times daily (before meals and before bed) until we get it back down.

## 2022-03-06 NOTE — Telephone Encounter (Signed)
Readings: ?Yesterday morning - HIGH took 4 units of humalog ?Bedtime - HIGH  ?This morning it was 539 and took 4 units of humalog. I had her check it with me on the phone and it was 376 ?

## 2022-03-07 DIAGNOSIS — R350 Frequency of micturition: Secondary | ICD-10-CM | POA: Diagnosis not present

## 2022-03-07 DIAGNOSIS — I10 Essential (primary) hypertension: Secondary | ICD-10-CM | POA: Diagnosis not present

## 2022-03-07 DIAGNOSIS — Z6824 Body mass index (BMI) 24.0-24.9, adult: Secondary | ICD-10-CM | POA: Diagnosis not present

## 2022-03-07 DIAGNOSIS — K2970 Gastritis, unspecified, without bleeding: Secondary | ICD-10-CM | POA: Diagnosis not present

## 2022-03-07 DIAGNOSIS — R110 Nausea: Secondary | ICD-10-CM | POA: Diagnosis not present

## 2022-03-07 DIAGNOSIS — Z299 Encounter for prophylactic measures, unspecified: Secondary | ICD-10-CM | POA: Diagnosis not present

## 2022-03-08 ENCOUNTER — Encounter: Payer: Medicare HMO | Attending: Internal Medicine | Admitting: Physician Assistant

## 2022-03-08 DIAGNOSIS — I1310 Hypertensive heart and chronic kidney disease without heart failure, with stage 1 through stage 4 chronic kidney disease, or unspecified chronic kidney disease: Secondary | ICD-10-CM | POA: Diagnosis not present

## 2022-03-08 DIAGNOSIS — L03114 Cellulitis of left upper limb: Secondary | ICD-10-CM | POA: Diagnosis not present

## 2022-03-08 DIAGNOSIS — I6350 Cerebral infarction due to unspecified occlusion or stenosis of unspecified cerebral artery: Secondary | ICD-10-CM | POA: Diagnosis not present

## 2022-03-08 DIAGNOSIS — E871 Hypo-osmolality and hyponatremia: Secondary | ICD-10-CM | POA: Diagnosis not present

## 2022-03-08 DIAGNOSIS — R778 Other specified abnormalities of plasma proteins: Secondary | ICD-10-CM | POA: Diagnosis not present

## 2022-03-08 DIAGNOSIS — I10 Essential (primary) hypertension: Secondary | ICD-10-CM | POA: Diagnosis not present

## 2022-03-08 DIAGNOSIS — N183 Chronic kidney disease, stage 3 unspecified: Secondary | ICD-10-CM | POA: Insufficient documentation

## 2022-03-08 DIAGNOSIS — Z79899 Other long term (current) drug therapy: Secondary | ICD-10-CM | POA: Diagnosis not present

## 2022-03-08 DIAGNOSIS — N1832 Chronic kidney disease, stage 3b: Secondary | ICD-10-CM | POA: Diagnosis not present

## 2022-03-08 DIAGNOSIS — R42 Dizziness and giddiness: Secondary | ICD-10-CM | POA: Diagnosis not present

## 2022-03-08 DIAGNOSIS — I130 Hypertensive heart and chronic kidney disease with heart failure and stage 1 through stage 4 chronic kidney disease, or unspecified chronic kidney disease: Secondary | ICD-10-CM | POA: Diagnosis not present

## 2022-03-08 DIAGNOSIS — G9341 Metabolic encephalopathy: Secondary | ICD-10-CM | POA: Diagnosis not present

## 2022-03-08 DIAGNOSIS — R29818 Other symptoms and signs involving the nervous system: Secondary | ICD-10-CM | POA: Diagnosis not present

## 2022-03-08 DIAGNOSIS — E162 Hypoglycemia, unspecified: Secondary | ICD-10-CM | POA: Diagnosis not present

## 2022-03-08 DIAGNOSIS — L98492 Non-pressure chronic ulcer of skin of other sites with fat layer exposed: Secondary | ICD-10-CM | POA: Insufficient documentation

## 2022-03-08 DIAGNOSIS — N170 Acute kidney failure with tubular necrosis: Secondary | ICD-10-CM | POA: Diagnosis not present

## 2022-03-08 DIAGNOSIS — N184 Chronic kidney disease, stage 4 (severe): Secondary | ICD-10-CM | POA: Diagnosis not present

## 2022-03-08 DIAGNOSIS — D509 Iron deficiency anemia, unspecified: Secondary | ICD-10-CM | POA: Diagnosis not present

## 2022-03-08 DIAGNOSIS — M80021D Age-related osteoporosis with current pathological fracture, right humerus, subsequent encounter for fracture with routine healing: Secondary | ICD-10-CM | POA: Diagnosis not present

## 2022-03-08 DIAGNOSIS — I129 Hypertensive chronic kidney disease with stage 1 through stage 4 chronic kidney disease, or unspecified chronic kidney disease: Secondary | ICD-10-CM | POA: Diagnosis not present

## 2022-03-08 DIAGNOSIS — E11622 Type 2 diabetes mellitus with other skin ulcer: Secondary | ICD-10-CM | POA: Diagnosis present

## 2022-03-08 DIAGNOSIS — R2681 Unsteadiness on feet: Secondary | ICD-10-CM | POA: Diagnosis not present

## 2022-03-08 DIAGNOSIS — E1122 Type 2 diabetes mellitus with diabetic chronic kidney disease: Secondary | ICD-10-CM | POA: Diagnosis not present

## 2022-03-08 DIAGNOSIS — M6281 Muscle weakness (generalized): Secondary | ICD-10-CM | POA: Diagnosis not present

## 2022-03-08 DIAGNOSIS — Z960 Presence of urogenital implants: Secondary | ICD-10-CM | POA: Diagnosis not present

## 2022-03-08 DIAGNOSIS — D496 Neoplasm of unspecified behavior of brain: Secondary | ICD-10-CM | POA: Diagnosis not present

## 2022-03-08 DIAGNOSIS — I2781 Cor pulmonale (chronic): Secondary | ICD-10-CM | POA: Diagnosis not present

## 2022-03-08 DIAGNOSIS — Z20822 Contact with and (suspected) exposure to covid-19: Secondary | ICD-10-CM | POA: Diagnosis not present

## 2022-03-08 DIAGNOSIS — D631 Anemia in chronic kidney disease: Secondary | ICD-10-CM | POA: Diagnosis not present

## 2022-03-08 DIAGNOSIS — E1022 Type 1 diabetes mellitus with diabetic chronic kidney disease: Secondary | ICD-10-CM | POA: Diagnosis not present

## 2022-03-08 DIAGNOSIS — E119 Type 2 diabetes mellitus without complications: Secondary | ICD-10-CM | POA: Diagnosis not present

## 2022-03-08 DIAGNOSIS — H532 Diplopia: Secondary | ICD-10-CM | POA: Diagnosis not present

## 2022-03-08 DIAGNOSIS — N281 Cyst of kidney, acquired: Secondary | ICD-10-CM | POA: Diagnosis not present

## 2022-03-08 DIAGNOSIS — R079 Chest pain, unspecified: Secondary | ICD-10-CM | POA: Diagnosis not present

## 2022-03-08 DIAGNOSIS — I5030 Unspecified diastolic (congestive) heart failure: Secondary | ICD-10-CM | POA: Diagnosis not present

## 2022-03-08 DIAGNOSIS — R531 Weakness: Secondary | ICD-10-CM | POA: Diagnosis not present

## 2022-03-08 DIAGNOSIS — R4182 Altered mental status, unspecified: Secondary | ICD-10-CM | POA: Diagnosis not present

## 2022-03-08 DIAGNOSIS — N179 Acute kidney failure, unspecified: Secondary | ICD-10-CM | POA: Diagnosis not present

## 2022-03-08 DIAGNOSIS — Z8673 Personal history of transient ischemic attack (TIA), and cerebral infarction without residual deficits: Secondary | ICD-10-CM | POA: Diagnosis not present

## 2022-03-08 NOTE — Progress Notes (Signed)
[name], [name] ([id number]) ?Visit Report for 03/08/2022 ?Arrival Information Details ?Patient Name: Heather [name], [name] ?Date of Service: 03/08/2022 10:30 AM ?Medical Record Number: [id number] ?Patient Account Number: [id number] ?Date of Birth/Sex: Mar 03, 1951 (71 y.o. F) ?Treating RN: Levora Dredge ?Primary Care Maitlyn Penza: Heather [name] Other Clinician: ?Referring Roi Jafari: Heather [name] ?Treating Jani Ploeger/Extender: Jeri Cos ?Weeks in Treatment: 2 ?Visit Information History Since Last Visit ?Added or deleted any medications: No ?Patient Arrived: Ambulatory ?Any new allergies or adverse reactions: No ?Arrival Time: 10:20 ?Had a fall or experienced change in No ?Accompanied By: self ?activities of daily living that may affect ?Transfer Assistance: None ?risk of falls: ?Patient Identification Verified: Yes ?Hospitalized since last visit: No ?Secondary Verification Process Completed: Yes ?Has Dressing in Place as Prescribed: Yes ?Pain Present Now: No ?Electronic Signature(s) ?Signed: 03/08/2022 4:39:51 PM By: Levora Dredge ?Entered By: Levora Dredge on 03/08/2022 10:25:21 ?[name], [name] ([id number]) ?-------------------------------------------------------------------------------- ?Clinic Level of Care Assessment Details ?Patient Name: Heather [name], [name] ?Date of Service: 03/08/2022 10:30 AM ?Medical Record Number: [id number] ?Patient Account Number: [id number] ?Date of Birth/Sex: [birth year] (71 y.o. F) ?Treating RN: Levora Dredge ?Primary Care Chad Donoghue: Heather [name] Other Clinician: ?Referring Gracin Mcpartland: Heather [name] ?Treating Revecca Nachtigal/Extender: Jeri Cos ?Weeks in Treatment: 2 ?Clinic Level of Care Assessment Items ?TOOL 4 Quantity Score ?'[]'$  - Use when only an EandM is performed on FOLLOW-UP visit 0 ?ASSESSMENTS - Nursing Assessment / Reassessment ?X - Reassessment of Co-morbidities (includes updates in patient status) 1 10 ?X- 1 5 ?Reassessment of Adherence to Treatment Plan ?ASSESSMENTS - Wound and  Skin Assessment / Reassessment ?X - Simple Wound Assessment / Reassessment - one wound 1 5 ?'[]'$  - 0 ?Complex Wound Assessment / Reassessment - multiple wounds ?'[]'$  - 0 ?Dermatologic / Skin Assessment (not related to wound area) ?ASSESSMENTS - Focused Assessment ?'[]'$  - Circumferential Edema Measurements - multi extremities 0 ?'[]'$  - 0 ?Nutritional Assessment / Counseling / Intervention ?'[]'$  - 0 ?Lower Extremity Assessment (monofilament, tuning fork, pulses) ?'[]'$  - 0 ?Peripheral Arterial Disease Assessment (using hand held doppler) ?ASSESSMENTS - Ostomy and/or Continence Assessment and Care ?'[]'$  - Incontinence Assessment and Management 0 ?'[]'$  - 0 ?Ostomy Care Assessment and Management (repouching, etc.) ?PROCESS - Coordination of Care ?X - Simple Patient / Family Education for ongoing care 1 15 ?'[]'$  - 0 ?Complex (extensive) Patient / Family Education for ongoing care ?'[]'$  - 0 ?Staff obtains Consents, Records, Test Results / Process Orders ?'[]'$  - 0 ?Staff telephones HHA, Nursing Homes / Clarify orders / etc ?'[]'$  - 0 ?Routine Transfer to another Facility (non-emergent condition) ?'[]'$  - 0 ?Routine Hospital Admission (non-emergent condition) ?'[]'$  - 0 ?New Admissions / Biomedical engineer / Ordering NPWT, Apligraf, etc. ?'[]'$  - 0 ?Emergency Hospital Admission (emergent condition) ?X- 1 10 ?Simple Discharge Coordination ?'[]'$  - 0 ?Complex (extensive) Discharge Coordination ?PROCESS - Special Needs ?'[]'$  - Pediatric / Minor Patient Management 0 ?'[]'$  - 0 ?Isolation Patient Management ?'[]'$  - 0 ?Hearing / Language / Visual special needs ?'[]'$  - 0 ?Assessment of Community assistance (transportation, D/C planning, etc.) ?'[]'$  - 0 ?Additional assistance / Altered mentation ?'[]'$  - 0 ?Support Surface(s) Assessment (bed, cushion, seat, etc.) ?INTERVENTIONS - Wound Cleansing / Measurement ?[name], [name] ([id number]) ?X- 1 5 ?Simple Wound Cleansing - one wound ?'[]'$  - 0 ?Complex Wound Cleansing - multiple wounds ?X- 1 5 ?Wound Imaging (photographs - any  number of wounds) ?'[]'$  - 0 ?Wound Tracing (instead of photographs) ?X- 1 5 ?Simple Wound Measurement - one wound ?'[]'$  - 0 ?Complex Wound Measurement - multiple  wounds ?INTERVENTIONS - Wound Dressings ?X - Small Wound Dressing one or multiple wounds 1 10 ?'[]'$  - 0 ?Medium Wound Dressing one or multiple wounds ?'[]'$  - 0 ?Large Wound Dressing one or multiple wounds ?X- 1 5 ?Application of Medications - topical ?'[]'$  - 0 ?Application of Medications - injection ?INTERVENTIONS - Miscellaneous ?'[]'$  - External ear exam 0 ?'[]'$  - 0 ?Specimen Collection (cultures, biopsies, blood, body fluids, etc.) ?'[]'$  - 0 ?Specimen(s) / Culture(s) sent or taken to Lab for analysis ?'[]'$  - 0 ?Patient Transfer (multiple staff / Civil Service fast streamer / Similar devices) ?'[]'$  - 0 ?Simple Staple / Suture removal (25 or less) ?'[]'$  - 0 ?Complex Staple / Suture removal (26 or more) ?'[]'$  - 0 ?Hypo / Hyperglycemic Management (close monitor of Blood Glucose) ?'[]'$  - 0 ?Ankle / Brachial Index (ABI) - do not check if billed separately ?X- 1 5 ?Vital Signs ?Has the patient been seen at the hospital within the last three years: Yes ?Total Score: 80 ?Level Of Care: New/Established - Level ?3 ?Electronic Signature(s) ?Signed: 03/08/2022 4:39:51 PM By: Levora Dredge ?Entered By: Levora Dredge on 03/08/2022 11:14:06 ?[name], [name] ([id number]) ?-------------------------------------------------------------------------------- ?Encounter Discharge Information Details ?Patient Name: Heather [name], [name] ?Date of Service: 03/08/2022 10:30 AM ?Medical Record Number: [id number] ?Patient Account Number: [id number] ?Date of Birth/Sex: 1951/01/02 (71 y.o. F) ?Treating RN: Levora Dredge ?Primary Care Shawne Eskelson: Heather [name] Other Clinician: ?Referring Heather [name]: Heather [name] ?Treating Kaesyn Johnston/Extender: Jeri Cos ?Weeks in Treatment: 2 ?Encounter Discharge Information Items ?Discharge Condition: Stable ?Ambulatory Status: Ambulatory ?Discharge Destination: Home ?Transportation: Private  Auto ?Accompanied By: self ?Schedule Follow-up Appointment: Yes ?Clinical Summary of Care: Patient Declined ?Electronic Signature(s) ?Signed: 03/08/2022 4:39:51 PM By: Levora Dredge ?Entered By: Levora Dredge on 03/08/2022 11:14:45 ?Caswell, Chassie A. ([id number]) ?-------------------------------------------------------------------------------- ?Lower Extremity Assessment Details ?Patient Name: Heather [name], [name] ?Date of Service: 03/08/2022 10:30 AM ?Medical Record Number: [id number] ?Patient Account Number: [id number] ?Date of Birth/Sex: 1951/05/17 (71 y.o. F) ?Treating RN: Levora Dredge ?Primary Care Jahmiyah Dullea: Heather [name] Other Clinician: ?Referring Guila Owensby: Heather [name] ?Treating Shaine Mount/Extender: Jeri Cos ?Weeks in Treatment: 2 ?Electronic Signature(s) ?Signed: 03/08/2022 4:39:51 PM By: Levora Dredge ?Entered By: Levora Dredge on 03/08/2022 10:31:07 ?Fukushima, Marshay A. ([id number]) ?-------------------------------------------------------------------------------- ?Multi Wound Chart Details ?Patient Name: Heather [name], [name] ?Date of Service: 03/08/2022 10:30 AM ?Medical Record Number: [id number] ?Patient Account Number: [id number] ?Date of Birth/Sex: Feb 17, 1951 (71 y.o. F) ?Treating RN: Levora Dredge ?Primary Care Makyiah Lie: Heather [name] Other Clinician: ?Referring Britanie Harshman: Heather [name] ?Treating Kable Haywood/Extender: Jeri Cos ?Weeks in Treatment: 2 ?Vital Signs ?Height(in): 63 ?Pulse(bpm): 73 ?Weight(lbs): 131 ?Blood Pressure(mmHg): 151/70 ?Body Mass Index(BMI): 23.2 ?Temperature(??F): 97.7 ?Respiratory Rate(breaths/min): 18 ?Photos: [N/A:N/A] ?Wound Location: Left, Midline Wrist N/A N/A ?Wounding Event: Trauma N/A N/A ?Primary Etiology: Trauma, Other N/A N/A ?Comorbid History: Anemia, Coronary Artery Disease, N/A N/A ?Hypertension, Type II Diabetes, ?Gout, Osteoarthritis, Neuropathy, ?Seizure Disorder ?Date Acquired: 11/25/2021 N/A N/A ?Weeks of Treatment: 2 N/A N/A ?Wound Status: Open N/A  N/A ?Wound Recurrence: No N/A N/A ?Measurements L x W x D (cm) 0.4x0.9x0.1 N/A N/A ?Area (cm?) : 0.283 N/A N/A ?Volume (cm?) : 0.028 N/A N/A ?% Reduction in Area: 83.60% N/A N/A ?% Reduction in Volume: 83.80

## 2022-03-08 NOTE — Progress Notes (Addendum)
Hardeman, Tomekia A. ([id number]) ?Visit Report for 03/08/2022 ?Chief Complaint Document Details ?Patient Name: Heather [name], [name] ?Date of Service: 03/08/2022 10:30 AM ?Medical Record Number: [id number] ?Patient Account Number: [id number] ?Date of Birth/Sex: 04/23/1951 (71 y.o. F) ?Treating RN: Levora Dredge ?Primary Care Provider: [name] Other Clinician: ?Referring Provider: [name] ?Treating Provider/Extender: Jeri Cos ?Weeks in Treatment: 2 ?Information Obtained from: Patient ?Chief Complaint ?Left wrist ulcer s/p infiltration ?Electronic Signature(s) ?Signed: 03/08/2022 10:42:16 AM By: Worthy Keeler PA-C ?Entered By: Worthy Keeler on 03/08/2022 10:42:15 ?Salo, Kaylyne A. ([id number]) ?-------------------------------------------------------------------------------- ?HPI Details ?Patient Name: Heather [name], [name] ?Date of Service: 03/08/2022 10:30 AM ?Medical Record Number: [id number] ?Patient Account Number: [id number] ?Date of Birth/Sex: 1951-05-11 (71 y.o. F) ?Treating RN: Levora Dredge ?Primary Care Provider: [name] Other Clinician: ?Referring Provider: [name] ?Treating Provider/Extender: Jeri Cos ?Weeks in Treatment: 2 ?History of Present Illness ?HPI Description: 02-21-2022 upon evaluation today patient presents for initial evaluation here in the clinic. She was actually in the hospital ?November 25, 2021. Subsequently she had a fall where she suffered a right shoulder dislocation and humeral head fracture. She was initially treated ?in that regard but was in the hospital for time with all this. During the time that she was in the hospital she did have an infiltration of the IV in her ?hand and subsequently this caused an open wound. This was treated originally with bacitracin and she has been placed on Keflex. Most recently ?she was on doxycycline April 3 that is complete at this point. She is diabetic her most recent hemoglobin A1c was 5.6 that was this month April ?2023.  With that being said overall I feel like that she is definitely making progress and the wound looks pretty darn good today compared to what ?it was even in the pictures from the hospital where she had significant eschar covering the majority of the surface of the wound. ?Patient does have a history of diabetes mellitus type 2, hypertension, and chronic kidney disease stage III. ?03-08-2022 upon evaluation today patient appears to be doing excellent in regard to her wound. She has been tolerating the dressing changes ?without complication. Fortunately there does not appear to be any signs of active infection and the wound does appear to be significantly ?improved. ?Electronic Signature(s) ?Signed: 03/09/2022 7:43:49 AM By: Worthy Keeler PA-C ?Entered By: Worthy Keeler on 03/09/2022 07:43:49 ?Heather [name], [name] ([id number]) ?-------------------------------------------------------------------------------- ?Physical Exam Details ?Patient Name: Heather [name], [name] ?Date of Service: 03/08/2022 10:30 AM ?Medical Record Number: [id number] ?Patient Account Number: [id number] ?Date of Birth/Sex: 27-Jul-1951 (71 y.o. F) ?Treating RN: Levora Dredge ?Primary Care Provider: [name] Other Clinician: ?Referring Provider: [name] ?Treating Provider/Extender: Jeri Cos ?Weeks in Treatment: 2 ?Constitutional ?Well-nourished and well-hydrated in no acute distress. ?Respiratory ?normal breathing without difficulty. ?Psychiatric ?this patient is able to make decisions and demonstrates good insight into disease process. Alert and Oriented x 3. pleasant and cooperative. ?Notes ?Upon inspection patient's wound bed showed signs of good granulation and epithelization at this point. ?Electronic Signature(s) ?Signed: 03/09/2022 7:44:45 AM By: Worthy Keeler PA-C ?Entered By: Worthy Keeler on 03/09/2022 07:44:45 ?Heather [name], [name]  ([id number]) ?-------------------------------------------------------------------------------- ?Physician Orders Details ?Patient Name: Heather [name], [name] ?Date of Service: 03/08/2022 10:30 AM ?Medical Record Number: [id number] ?Patient Account Number: [id number] ?Date of Birth/Sex: August 27, 1951 (71 y.o. F) ?Treating RN: Levora Dredge ?Primary Care Provider: [name] Other Clinician: ?Referring Provider: [name] ?Treating Provider/Extender: Jeri Cos ?Weeks in Treatment: 2 ?Verbal / Phone Orders: No ?Diagnosis  Coding ?ICD-10 Coding ?Code Description ?T80.1XXA Vascular complications following infusion, transfusion and therapeutic injection, initial encounter ?L98.492 Non-pressure chronic ulcer of skin of other sites with fat layer exposed ?E11.622 Type 2 diabetes mellitus with other skin ulcer ?I10 Essential (primary) hypertension ?N18.30 Chronic kidney disease, stage 3 unspecified ?Follow-up Appointments ?o Return Appointment in 2 weeks. ?o Nurse Visit as needed ?Home Health ?[facility] , pt states sees RN Aimee ?o [place] for wound care. May utilize formulary equivalent dressing for wound treatment orders unless ?otherwise specified. Home Health Nurse may visit PRN to address patientos wound care needs. ?o Scheduled days for dressing changes to be completed; exception, patient has scheduled wound care visit that day. ?o **Please direct any NON-WOUND related issues/requests for orders to patient's Primary Care Physician. **If current ?dressing causes regression in wound condition, may D/C ordered dressing product/s and apply Normal Saline Moist ?Dressing daily until next [place] or Other MD appointment. **Notify [facility] of regression in ?wound condition at [phone]. ?Bathing/ Shower/ Hygiene ?o Clean wound with Normal Saline or wound cleanser. ?o Wash wounds with antibacterial soap and water. ?o May shower  with wound dressing protected with water repellent cover or cast protector. ?o No tub bath. ?Wound Treatment ?Wound #1 - Wrist Wound Laterality: Left, Midline ?Cleanser: Normal Saline 3 x Per Week/30 Days ?Discharge Instructions: Wash your hands with soap and water. Remove old dressing, discard into plastic bag and place into trash. ?Cleanse the wound with Normal Saline prior to applying a clean dressing using gauze sponges, not tissues or cotton balls. Do not scrub ?or use excessive force. Pat dry using gauze sponges, not tissue or cotton balls. ?Cleanser: Soap and Water 3 x Per Week/30 Days ?Discharge Instructions: Gently cleanse wound with antibacterial soap, rinse and pat dry prior to dressing wounds ?Primary Dressing: Prisma 4.34 (in) 3 x Per Week/30 Days ?Discharge Instructions: Moisten w/normal saline or sterile water; Cover wound as directed. Do not remove from wound bed. ok ?to use hydrogel to moisten prisma if notice it is remaining dry ?Secondary Dressing: (SILICONE BORDER) Zetuvit Plus SILICONE BORDER Dressing 4x4 (in/in) 3 x Per Week/30 Days ?Discharge Instructions: Please do not put silicone bordered dressings under wraps. Use non-bordered dressing only. ?Electronic Signature(s) ?Signed: 03/08/2022 4:39:51 PM By: Levora Dredge ?Signed: 03/09/2022 8:03:27 AM By: Worthy Keeler PA-C ?Entered By: Levora Dredge on 03/08/2022 11:13:42 ?Heather [name], [name] ([id number]) ?-------------------------------------------------------------------------------- ?Problem List Details ?Patient Name: Heather [name], [name] ?Date of Service: 03/08/2022 10:30 AM ?Medical Record Number: [id number] ?Patient Account Number: [id number] ?Date of Birth/Sex: 14-Sep-1951 (71 y.o. F) ?Treating RN: Levora Dredge ?Primary Care Provider: [name] Other Clinician: ?Referring Provider: [name] ?Treating Provider/Extender: Jeri Cos ?Weeks in Treatment: 2 ?Active Problems ?ICD-10 ?Encounter ?Code Description Active Date  MDM ?Diagnosis ?T80.1XXA Vascular complications following infusion, transfusion and therapeutic 02/20/2022 No Yes ?injection, initial encounter ?U76.546 Non-pressure chronic ulcer of skin of other sites with fat layer exposed 02/20/2022 No Yes ?E11.622 Type 2 diabetes mellitus with other skin ulcer 4/25/

## 2022-03-12 ENCOUNTER — Ambulatory Visit: Payer: Medicare HMO | Admitting: Nurse Practitioner

## 2022-03-12 NOTE — Patient Instructions (Incomplete)
Diabetes Mellitus and Foot Care Foot care is an important part of your health, especially when you have diabetes. Diabetes may cause you to have problems because of poor blood flow (circulation) to your feet and legs, which can cause your skin to: Become thinner and drier. Break more easily. Heal more slowly. Peel and crack. You may also have nerve damage (neuropathy) in your legs and feet, causing decreased feeling in them. This means that you may not notice minor injuries to your feet that could lead to more serious problems. Noticing and addressing any potential problems early is the best way to prevent future foot problems. How to care for your feet Foot hygiene  Wash your feet daily with warm water and mild soap. Do not use hot water. Then, pat your feet and the areas between your toes until they are completely dry. Do not soak your feet as this can dry your skin. Trim your toenails straight across. Do not dig under them or around the cuticle. File the edges of your nails with an emery board or nail file. Apply a moisturizing lotion or petroleum jelly to the skin on your feet and to dry, brittle toenails. Use lotion that does not contain alcohol and is unscented. Do not apply lotion between your toes. Shoes and socks Wear clean socks or stockings every day. Make sure they are not too tight. Do not wear knee-high stockings since they may decrease blood flow to your legs. Wear shoes that fit properly and have enough cushioning. Always look in your shoes before you put them on to be sure there are no objects inside. To break in new shoes, wear them for just a few hours a day. This prevents injuries on your feet. Wounds, scrapes, corns, and calluses  Check your feet daily for blisters, cuts, bruises, sores, and redness. If you cannot see the bottom of your feet, use a mirror or ask someone for help. Do not cut corns or calluses or try to remove them with medicine. If you find a minor scrape,  cut, or break in the skin on your feet, keep it and the skin around it clean and dry. You may clean these areas with mild soap and water. Do not clean the area with peroxide, alcohol, or iodine. If you have a wound, scrape, corn, or callus on your foot, look at it several times a day to make sure it is healing and not infected. Check for: Redness, swelling, or pain. Fluid or blood. Warmth. Pus or a bad smell. General tips Do not cross your legs. This may decrease blood flow to your feet. Do not use heating pads or hot water bottles on your feet. They may burn your skin. If you have lost feeling in your feet or legs, you may not know this is happening until it is too late. Protect your feet from hot and cold by wearing shoes, such as at the beach or on hot pavement. Schedule a complete foot exam at least once a year (annually) or more often if you have foot problems. Report any cuts, sores, or bruises to your health care provider immediately. Where to find more information American Diabetes Association: www.diabetes.org Association of Diabetes Care & Education Specialists: www.diabeteseducator.org Contact a health care provider if: You have a medical condition that increases your risk of infection and you have any cuts, sores, or bruises on your feet. You have an injury that is not healing. You have redness on your legs or feet. You   feel burning or tingling in your legs or feet. You have pain or cramps in your legs and feet. Your legs or feet are numb. Your feet always feel cold. You have pain around any toenails. Get help right away if: You have a wound, scrape, corn, or callus on your foot and: You have pain, swelling, or redness that gets worse. You have fluid or blood coming from the wound, scrape, corn, or callus. Your wound, scrape, corn, or callus feels warm to the touch. You have pus or a bad smell coming from the wound, scrape, corn, or callus. You have a fever. You have a red  line going up your leg. Summary Check your feet every day for blisters, cuts, bruises, sores, and redness. Apply a moisturizing lotion or petroleum jelly to the skin on your feet and to dry, brittle toenails. Wear shoes that fit properly and have enough cushioning. If you have foot problems, report any cuts, sores, or bruises to your health care provider immediately. Schedule a complete foot exam at least once a year (annually) or more often if you have foot problems. This information is not intended to replace advice given to you by your health care provider. Make sure you discuss any questions you have with your health care provider. Document Revised: 05/05/2020 Document Reviewed: 05/05/2020 Elsevier Patient Education  2023 [employer].  

## 2022-03-16 ENCOUNTER — Ambulatory Visit: Payer: 59 | Admitting: Orthopedic Surgery

## 2022-03-16 ENCOUNTER — Observation Stay (HOSPITAL_COMMUNITY)
Admission: AD | Admit: 2022-03-16 | Discharge: 2022-03-17 | Disposition: A | Discharge status: Home Health | Payer: Medicare HMO | Source: Other Acute Inpatient Hospital | Attending: Internal Medicine | Admitting: Internal Medicine

## 2022-03-16 ENCOUNTER — Encounter: Payer: Self-pay | Admitting: Orthopedic Surgery

## 2022-03-16 ENCOUNTER — Encounter (HOSPITAL_COMMUNITY): Payer: Self-pay | Admitting: Internal Medicine

## 2022-03-16 ENCOUNTER — Other Ambulatory Visit: Payer: Self-pay

## 2022-03-16 DIAGNOSIS — E1169 Type 2 diabetes mellitus with other specified complication: Secondary | ICD-10-CM | POA: Diagnosis not present

## 2022-03-16 DIAGNOSIS — E782 Mixed hyperlipidemia: Secondary | ICD-10-CM | POA: Diagnosis not present

## 2022-03-16 DIAGNOSIS — I10 Essential (primary) hypertension: Secondary | ICD-10-CM | POA: Diagnosis not present

## 2022-03-16 DIAGNOSIS — I130 Hypertensive heart and chronic kidney disease with heart failure and stage 1 through stage 4 chronic kidney disease, or unspecified chronic kidney disease: Secondary | ICD-10-CM | POA: Diagnosis not present

## 2022-03-16 DIAGNOSIS — Z8673 Personal history of transient ischemic attack (TIA), and cerebral infarction without residual deficits: Secondary | ICD-10-CM | POA: Diagnosis not present

## 2022-03-16 DIAGNOSIS — L89322 Pressure ulcer of left buttock, stage 2: Secondary | ICD-10-CM | POA: Diagnosis not present

## 2022-03-16 DIAGNOSIS — N1832 Chronic kidney disease, stage 3b: Secondary | ICD-10-CM | POA: Diagnosis not present

## 2022-03-16 DIAGNOSIS — E11649 Type 2 diabetes mellitus with hypoglycemia without coma: Secondary | ICD-10-CM | POA: Diagnosis not present

## 2022-03-16 DIAGNOSIS — D649 Anemia, unspecified: Secondary | ICD-10-CM | POA: Diagnosis not present

## 2022-03-16 DIAGNOSIS — Z794 Long term (current) use of insulin: Secondary | ICD-10-CM | POA: Insufficient documentation

## 2022-03-16 DIAGNOSIS — Z85841 Personal history of malignant neoplasm of brain: Secondary | ICD-10-CM | POA: Diagnosis not present

## 2022-03-16 DIAGNOSIS — G9341 Metabolic encephalopathy: Secondary | ICD-10-CM | POA: Diagnosis present

## 2022-03-16 DIAGNOSIS — I5032 Chronic diastolic (congestive) heart failure: Secondary | ICD-10-CM | POA: Diagnosis not present

## 2022-03-16 DIAGNOSIS — E1165 Type 2 diabetes mellitus with hyperglycemia: Secondary | ICD-10-CM

## 2022-03-16 DIAGNOSIS — N179 Acute kidney failure, unspecified: Secondary | ICD-10-CM | POA: Diagnosis not present

## 2022-03-16 DIAGNOSIS — N184 Chronic kidney disease, stage 4 (severe): Secondary | ICD-10-CM | POA: Diagnosis present

## 2022-03-16 DIAGNOSIS — D539 Nutritional anemia, unspecified: Secondary | ICD-10-CM | POA: Diagnosis present

## 2022-03-16 DIAGNOSIS — Z96641 Presence of right artificial hip joint: Secondary | ICD-10-CM | POA: Insufficient documentation

## 2022-03-16 DIAGNOSIS — D631 Anemia in chronic kidney disease: Secondary | ICD-10-CM | POA: Diagnosis not present

## 2022-03-16 DIAGNOSIS — E871 Hypo-osmolality and hyponatremia: Secondary | ICD-10-CM | POA: Diagnosis not present

## 2022-03-16 DIAGNOSIS — Z79899 Other long term (current) drug therapy: Secondary | ICD-10-CM | POA: Insufficient documentation

## 2022-03-16 DIAGNOSIS — I2510 Atherosclerotic heart disease of native coronary artery without angina pectoris: Secondary | ICD-10-CM | POA: Insufficient documentation

## 2022-03-16 HISTORY — DX: Chronic diastolic (congestive) heart failure: I50.32

## 2022-03-16 LAB — COMPREHENSIVE METABOLIC PANEL
ALT: 33 U/L (ref 0–44)
AST: 47 U/L — ABNORMAL HIGH (ref 15–41)
Albumin: 2.6 g/dL — ABNORMAL LOW (ref 3.5–5.0)
Alkaline Phosphatase: 49 U/L (ref 38–126)
Anion gap: 9 (ref 5–15)
BUN: 43 mg/dL — ABNORMAL HIGH (ref 8–23)
CO2: 17 mmol/L — ABNORMAL LOW (ref 22–32)
Calcium: 8.5 mg/dL — ABNORMAL LOW (ref 8.9–10.3)
Chloride: 108 mmol/L (ref 98–111)
Creatinine, Ser: 3.63 mg/dL — ABNORMAL HIGH (ref 0.44–1.00)
GFR, Estimated: 13 mL/min — ABNORMAL LOW (ref >60)
Glucose, Bld: 143 mg/dL — ABNORMAL HIGH (ref 70–99)
Potassium: 4 mmol/L (ref 3.5–5.1)
Sodium: 134 mmol/L — ABNORMAL LOW (ref 135–145)
Total Bilirubin: 0.5 mg/dL (ref 0.3–1.2)
Total Protein: 5.6 g/dL — ABNORMAL LOW (ref 6.5–8.1)

## 2022-03-16 LAB — CBC WITH DIFFERENTIAL/PLATELET
Abs Immature Granulocytes: 0.03 10*3/uL (ref 0.00–0.07)
Basophils Absolute: 0 10*3/uL (ref 0.0–0.1)
Basophils Relative: 1 %
Eosinophils Absolute: 0.2 10*3/uL (ref 0.0–0.5)
Eosinophils Relative: 5 %
HCT: 21.4 % — ABNORMAL LOW (ref 36.0–46.0)
Hemoglobin: 6.9 g/dL — CL (ref 12.0–15.0)
Immature Granulocytes: 1 %
Lymphocytes Relative: 20 %
Lymphs Abs: 0.8 10*3/uL (ref 0.7–4.0)
MCH: 31.4 pg (ref 26.0–34.0)
MCHC: 32.2 g/dL (ref 30.0–36.0)
MCV: 97.3 fL (ref 80.0–100.0)
Monocytes Absolute: 0.4 10*3/uL (ref 0.1–1.0)
Monocytes Relative: 9 %
Neutro Abs: 2.7 10*3/uL (ref 1.7–7.7)
Neutrophils Relative %: 64 %
Platelets: 147 10*3/uL — ABNORMAL LOW (ref 150–400)
RBC: 2.2 MIL/uL — ABNORMAL LOW (ref 3.87–5.11)
RDW: 14.9 % (ref 11.5–15.5)
WBC: 4.1 10*3/uL (ref 4.0–10.5)
nRBC: 0 % (ref 0.0–0.2)

## 2022-03-16 LAB — URINALYSIS, ROUTINE W REFLEX MICROSCOPIC
Bilirubin Urine: NEGATIVE
Glucose, UA: NEGATIVE mg/dL
Hgb urine dipstick: NEGATIVE
Ketones, ur: NEGATIVE mg/dL
Nitrite: NEGATIVE
Protein, ur: 100 mg/dL — AB
Specific Gravity, Urine: 1.012 (ref 1.005–1.030)
pH: 5 (ref 5.0–8.0)

## 2022-03-16 LAB — GLUCOSE, CAPILLARY
Glucose-Capillary: 118 mg/dL — ABNORMAL HIGH (ref 70–99)
Glucose-Capillary: 207 mg/dL — ABNORMAL HIGH (ref 70–99)
Glucose-Capillary: 123 mg/dL — ABNORMAL HIGH (ref 70–99)
Glucose-Capillary: 168 mg/dL — ABNORMAL HIGH (ref 70–99)

## 2022-03-16 LAB — AMMONIA: Ammonia: 24 umol/L (ref 9–35)

## 2022-03-16 LAB — IRON AND TIBC
Iron: 49 ug/dL (ref 28–170)
Saturation Ratios: 18 % (ref 10.4–31.8)
TIBC: 279 ug/dL (ref 250–450)
UIBC: 230 ug/dL

## 2022-03-16 LAB — VITAMIN B12: Vitamin B-12: 952 pg/mL — ABNORMAL HIGH (ref 180–914)

## 2022-03-16 LAB — PREPARE RBC (CROSSMATCH)

## 2022-03-16 LAB — PHOSPHORUS: Phosphorus: 5.8 mg/dL — ABNORMAL HIGH (ref 2.5–4.6)

## 2022-03-16 LAB — CREATININE, URINE, RANDOM: Creatinine, Urine: 99.15 mg/dL

## 2022-03-16 LAB — MAGNESIUM: Magnesium: 1.3 mg/dL — ABNORMAL LOW (ref 1.7–2.4)

## 2022-03-16 LAB — SODIUM, URINE, RANDOM: Sodium, Ur: 21 mmol/L

## 2022-03-16 LAB — FOLATE: Folate: 14.4 ng/mL (ref >5.9)

## 2022-03-16 LAB — C-REACTIVE PROTEIN: CRP: 3.6 mg/dL — ABNORMAL HIGH (ref <1.0)

## 2022-03-16 LAB — SEDIMENTATION RATE: Sed Rate: 28 mm/hr — ABNORMAL HIGH (ref 0–22)

## 2022-03-16 MED ORDER — INSULIN ASPART 100 UNIT/ML IJ SOLN
0.0000 [IU] | Freq: Three times a day (TID) | INTRAMUSCULAR | Status: DC
  Administered 2022-03-16: 2 [IU] via SUBCUTANEOUS
  Administered 2022-03-17 (×2): 1 [IU] via SUBCUTANEOUS

## 2022-03-16 MED ORDER — ONDANSETRON HCL 4 MG/2ML IJ SOLN: 4.0000 mg | Freq: Four times a day (QID) | INTRAMUSCULAR | Status: DC | PRN

## 2022-03-16 MED ORDER — METOPROLOL SUCCINATE ER 50 MG PO TB24
50.0000 mg | ORAL_TABLET | Freq: Every day | ORAL | Status: DC
  Administered 2022-03-16 – 2022-03-17 (×2): 50 mg via ORAL
  Filled 2022-03-16 (×2): qty 1

## 2022-03-16 MED ORDER — HYDRALAZINE HCL 25 MG PO TABS
25.0000 mg | ORAL_TABLET | Freq: Three times a day (TID) | ORAL | Status: DC
  Administered 2022-03-16 – 2022-03-17 (×5): 25 mg via ORAL
  Filled 2022-03-16 (×5): qty 1

## 2022-03-16 MED ORDER — DARBEPOETIN ALFA 60 MCG/0.3ML IJ SOSY
60.0000 ug | PREFILLED_SYRINGE | Freq: Once | INTRAMUSCULAR | Status: AC
  Administered 2022-03-16: 60 ug via SUBCUTANEOUS
  Filled 2022-03-16: qty 0.3

## 2022-03-16 MED ORDER — DICYCLOMINE HCL 20 MG PO TABS: 20.0000 mg | ORAL_TABLET | Freq: Two times a day (BID) | ORAL | Status: DC

## 2022-03-16 MED ORDER — LACTATED RINGERS IV SOLN: INTRAVENOUS | Status: DC

## 2022-03-16 MED ORDER — HEPARIN SODIUM (PORCINE) 5000 UNIT/ML IJ SOLN
5000.0000 [IU] | Freq: Three times a day (TID) | INTRAMUSCULAR | Status: DC
  Administered 2022-03-16 – 2022-03-17 (×4): 5000 [IU] via SUBCUTANEOUS
  Filled 2022-03-16 (×4): qty 1

## 2022-03-16 MED ORDER — SODIUM BICARBONATE 650 MG PO TABS
650.0000 mg | ORAL_TABLET | Freq: Three times a day (TID) | ORAL | Status: DC
  Administered 2022-03-16: 650 mg via ORAL
  Filled 2022-03-16: qty 1

## 2022-03-16 MED ORDER — ACETAMINOPHEN 650 MG RE SUPP: 650.0000 mg | Freq: Four times a day (QID) | RECTAL | Status: DC | PRN

## 2022-03-16 MED ORDER — SODIUM CHLORIDE 0.9% IV SOLUTION: Freq: Once | INTRAVENOUS | Status: AC

## 2022-03-16 MED ORDER — ACETAMINOPHEN 325 MG PO TABS: 650.0000 mg | ORAL_TABLET | Freq: Four times a day (QID) | ORAL | Status: DC | PRN

## 2022-03-16 MED ORDER — POLYETHYLENE GLYCOL 3350 17 G PO PACK: 17.0000 g | PACK | Freq: Every day | ORAL | Status: DC | PRN

## 2022-03-16 MED ORDER — OXYCODONE HCL 5 MG PO TABS
5.0000 mg | ORAL_TABLET | Freq: Four times a day (QID) | ORAL | Status: DC | PRN
  Administered 2022-03-16 – 2022-03-17 (×3): 5 mg via ORAL
  Filled 2022-03-16 (×3): qty 1

## 2022-03-16 MED ORDER — ALLOPURINOL 100 MG PO TABS
100.0000 mg | ORAL_TABLET | Freq: Every day | ORAL | Status: DC
  Administered 2022-03-16 – 2022-03-17 (×2): 100 mg via ORAL
  Filled 2022-03-16 (×2): qty 1

## 2022-03-16 MED ORDER — ONDANSETRON HCL 4 MG PO TABS: 4.0000 mg | ORAL_TABLET | Freq: Four times a day (QID) | ORAL | Status: DC | PRN

## 2022-03-16 MED ORDER — ALBUMIN HUMAN 25 % IV SOLN
25.0000 g | Freq: Four times a day (QID) | INTRAVENOUS | Status: AC
  Administered 2022-03-16 (×2): 25 g via INTRAVENOUS
  Filled 2022-03-16 (×2): qty 100

## 2022-03-16 MED ORDER — PANTOPRAZOLE SODIUM 40 MG PO TBEC
40.0000 mg | DELAYED_RELEASE_TABLET | Freq: Every day | ORAL | Status: DC
  Administered 2022-03-16 – 2022-03-17 (×2): 40 mg via ORAL
  Filled 2022-03-16 (×2): qty 1

## 2022-03-16 MED ORDER — FENOFIBRATE 160 MG PO TABS
160.0000 mg | ORAL_TABLET | Freq: Every day | ORAL | Status: DC
  Administered 2022-03-16 – 2022-03-17 (×2): 160 mg via ORAL
  Filled 2022-03-16 (×2): qty 1

## 2022-03-16 MED ORDER — ATORVASTATIN CALCIUM 80 MG PO TABS
80.0000 mg | ORAL_TABLET | Freq: Every day | ORAL | Status: DC
  Administered 2022-03-16 – 2022-03-17 (×2): 80 mg via ORAL
  Filled 2022-03-16 (×2): qty 1

## 2022-03-16 MED ORDER — CALCITRIOL 0.25 MCG PO CAPS
0.2500 ug | ORAL_CAPSULE | ORAL | Status: DC
  Administered 2022-03-16: 0.25 ug via ORAL
  Filled 2022-03-16: qty 1

## 2022-03-16 MED ORDER — SODIUM BICARBONATE 8.4 % IV SOLN
INTRAVENOUS | Status: DC
  Filled 2022-03-16 (×4): qty 75

## 2022-03-16 MED ORDER — GABAPENTIN 300 MG PO CAPS: 300.0000 mg | ORAL_CAPSULE | Freq: Every day | ORAL | Status: DC

## 2022-03-16 MED ORDER — SODIUM CHLORIDE 0.9 % IV SOLN
250.0000 mg | Freq: Every day | INTRAVENOUS | Status: AC
  Administered 2022-03-16 – 2022-03-17 (×2): 250 mg via INTRAVENOUS
  Filled 2022-03-16 (×2): qty 20

## 2022-03-16 MED ORDER — HYDRALAZINE HCL 20 MG/ML IJ SOLN
10.0000 mg | Freq: Four times a day (QID) | INTRAMUSCULAR | Status: DC | PRN
Start: 2022-03-16 — End: 2022-03-17

## 2022-03-16 NOTE — Progress Notes (Signed)
Pt inquiring about her glasses and cell phone. Per nurse at transferring hospital, [facility] pt's glasses and cell phone were taken home by pt's husband.

## 2022-03-16 NOTE — Assessment & Plan Note (Signed)
   Likely multifactorial due to anemia of chronic kidney disease with possible superimposed vitamin B12 deficiency (patent has a history of b12 deficiency)  Obtaining repeat iron panel, vit B12, folate  Obtaining type and screen  No clinical evidence of bleeding  Monitoring hemoglobin and hematocrit with serial CBCs

## 2022-03-16 NOTE — Progress Notes (Signed)
Pt's [id number] upon admission.

## 2022-03-16 NOTE — Consult Note (Signed)
[facility] Nephrology Consultation Note  Requesting MD: Dr. Terrilee Croak Reason for consult: AKI on CKDIV  HPI:  Heather [name] is a 71 y.o. female with past medical history significant for hypertension, HLD, diabetes, stroke, chronic diastolic CHF, chron's disease, CKD stage IV, transferred from outside hospital for the evaluation of AKI on CKD. Patient has had multiple hospitalization in the past for AKI on CKD and required dialysis briefly.  She follows with Dr. Theador Hawthorne with extensive serologies work-up were negative.  It seems like she has progressive declining kidney function. She was admitted to [facility] on 5/11 for new onset of diplopia and speech changes suspecting stroke however the MRI was negative.  The work-up showed peak creatinine level of around 4.14 which was gradually trended down to 3.69 before transfer.  The kidney ultrasound done on 5/17 showed no hydronephrosis.  In the hospital patient had episodes of confusion and altered thought to be due to declining kidney function therefore transferred to [facility] for nephrology evaluation.  The BUN level was 47 at outside hospital.  Here the creatinine level is 3.63, BUN 43, potassium 4.0, CO2 17, hemoglobin of 6.9.  The patient received a unit of blood transfusion overnight.  Started LR at 100 cc an hour with around 550 cc of urine output.  The patient is sitting on chair comfortable.  She is alert awake and oriented.  Denies nausea, vomiting chest pain, shortness of breath.  She has indwelling Foley catheter with clear urine.  Her home medication includes sodium bicarbonate, metoprolol, insulin,    PMHx:   Past Medical History:  Diagnosis Date   Acute renal failure superimposed on stage 3 chronic kidney disease ([place]) 01/21/2016   Anemia    Anxiety    B12 deficiency    Brain tumor (benign) ([place]) 10/29/2020   Cellulitis and abscess    Abdomen and buttocks   Chronic diastolic CHF (congestive heart  failure) ([place]) 03/16/2022   CKD (chronic kidney disease) stage 3, GFR 30-59 ml/min (HCC)    Closed fracture of distal end of femur, unspecified fracture morphology, initial encounter ([place])    Coronary arteriosclerosis 07/15/2018   Crohn disease ([place])    Depression    Diabetic neuropathy (HCC)    feet   DJD (degenerative joint disease)    Right forminal stenosis C4-5   Essential hypertension    Femur fracture, right ([place]) 71/69/6789   Folliculitis    GERD (gastroesophageal reflux disease)    Gout    Headache(784.0)    Hiatal hernia    History of blood transfusion    History of bronchitis    History of cardiac catheterization    No significant CAD 2012   History of kidney stones    surgery to remove   Insomnia    Irritable bowel syndrome    Mixed hyperlipidemia    Osteoarthritis    Palpitations    Pneumonia    Reflux esophagitis    Salmonella    Stroke [facility])    Tinnitus    Right   Type 2 diabetes mellitus ([place])     Past Surgical History:  Procedure Laterality Date   BACK SURGERY     c-spine surgery     03/2007   CARDIAC CATHETERIZATION     CHOLECYSTECTOMY     COLONOSCOPY  04/21/2011; 05/29/11   6/12: [place] - ?AVM at IC valve, inflammatory changes at [facility] valve [facility]); 7/12 - [facility]; IC valve erosions, look chronic and probably Crohn's  per path   colonoscopy  2017   [place]: random biopsies normal. TI normal   ESOPHAGOGASTRODUODENOSCOPY  05/29/11   normal   ESOPHAGOGASTRODUODENOSCOPY N/A 01/21/2016   Dr. Michail Sermon: normal   EYE SURGERY Bilateral    cataracts removed   FEMUR IM NAIL Right 04/10/2018   Procedure: INTRAMEDULLARY (IM) NAIL FEMORAL;  Surgeon: Paralee Cancel, MD;  Location: [facility];  Service: Orthopedics;  Laterality: Right;   FRACTURE SURGERY     GIVENS CAPSULE STUDY  11/17/2020   HARDWARE REMOVAL Right 04/10/2018   Procedure: HARDWARE REMOVAL RIGHT DISTAL FEMUR;  Surgeon: Paralee Cancel, MD;  Location: [facility];  Service: Orthopedics;   Laterality: Right;   HERNIA REPAIR     IR FLUORO GUIDE CV LINE RIGHT  04/01/2017   IR US GUIDE VASC ACCESS RIGHT  04/01/2017   JOINT REPLACEMENT Right    hip   OPEN REDUCTION INTERNAL FIXATION (ORIF) DISTAL RADIAL FRACTURE Right 05/28/2019   Procedure: OPEN REDUCTION INTERNAL FIXATION (ORIF) DISTAL RADIAL FRACTURE;  Surgeon: Verner Mould, MD;  Location: [facility];  Service: Orthopedics;  Laterality: Right;  with block 90 minutes   ORIF FEMUR FRACTURE Right 03/31/2017   Procedure: OPEN REDUCTION INTERNAL FIXATION (ORIF) DISTAL FEMUR FRACTURE;  Surgeon: Paralee Cancel, MD;  Location: [facility];  Service: Orthopedics;  Laterality: Right;   removal of kidney stone     Right knee arthroscopic surgery     TONSILLECTOMY     TOTAL ABDOMINAL HYSTERECTOMY      Family Hx:  Family History  Problem Relation Age of Onset   Diabetes Mother    Colon cancer Brother        POSSIBLY. Patient states diagnosed at age 64, then later states age 54. unclear and limited historian   Esophageal cancer Neg Hx    Rectal cancer Neg Hx    Stomach cancer Neg Hx     Social History:  reports that she has never smoked. She has never used smokeless tobacco. She reports that she does not drink alcohol and does not use drugs.  Allergies:  Allergies  Allergen Reactions   Cozaar [Losartan] Nausea And Vomiting, Swelling and Rash   Quinolones Itching, Nausea And Vomiting, Nausea Only and Swelling    Swelling of face, jaw, and lips    Cymbalta [Duloxetine Hcl] Itching and Swelling   Sulfa Antibiotics Nausea And Vomiting and Swelling    Headache  Swelling of feet, legs    Synalar [Fluocinolone] Swelling   Cipro [Ciprofloxacin Hcl] Nausea And Vomiting and Swelling    Swelling of face, jaw, lips   Diovan [Valsartan] Nausea And Vomiting and Swelling   Glucophage [Metformin] Other (See Comments)    GI upset   Ketalar [Ketamine] Swelling   Norco [Hydrocodone-Acetaminophen] Itching and Swelling   Norvasc [Amlodipine  Besylate] Itching and Swelling    Fluid retention    Nsaids Nausea And Vomiting and Other (See Comments)    Has bleeding ulcers   Tylenol [Acetaminophen] Itching and Swelling   Tape Itching and Rash    Paper tape can only be used on this patient   Zestril [Lisinopril] Swelling and Rash    Oral swelling Red streaks on arms/legs    Medications: Prior to Admission medications   Medication Sig Start Date End Date Taking? Authorizing Provider  acetaminophen (TYLENOL) 325 MG tablet Take 2 tablets (650 mg total) by mouth every 4 (four) hours. 12/01/21   Johnson, Clanford L, MD  allopurinol (ZYLOPRIM) 100 MG tablet Take 100  mg by mouth daily. 03/20/21   [provider]  Ascorbic Acid (VITAMIN C WITH ROSE HIPS) 500 MG tablet Take 500 mg by mouth every morning.    [provider]  atorvastatin (LIPITOR) 80 MG tablet Take 80 mg by mouth daily. 01/03/22   [provider]  augmented betamethasone dipropionate (DIPROLENE-AF) 0.05 % cream Apply 2 g topically 2 (two) times daily. 01/19/22   [provider]  busPIRone (BUSPAR) 10 MG tablet Take 10 mg by mouth 3 (three) times daily as needed for anxiety. 03/04/22   [provider]  calcipotriene (DOVONOX) 0.005 % cream Apply 1-2 g topically See admin instructions. 2-4 times daily 01/19/22   [provider]  calcitRIOL (ROCALTROL) 0.25 MCG capsule Take 0.25 mcg by mouth 3 (three) times a week. Monday,Wed and Fri. 10/05/21   [provider]  calcium carbonate (OS-CAL) 1250 (500 Ca) MG chewable tablet Chew 1 tablet by mouth 2 (two) times daily. 10/04/21 10/04/22  [provider]  Cholecalciferol (VITAMIN D) 2000 units CAPS Take 2,000 Units by mouth every morning.     [provider]  Cholecalciferol (VITAMIN D3) 25 MCG (1000 UT) CAPS Take 2,000 Units by mouth daily. 01/03/22   [provider]  cyanocobalamin (,VITAMIN B-12,) 1000 MCG/ML injection Inject 1,000 mcg into the muscle every  30 (thirty) days.      [provider]  desoximetasone (TOPICORT) 0.05 % cream Apply 1 application. topically 2 (two) times daily. 01/19/22   [provider]  diazepam (VALIUM) 5 MG tablet Take 1 tablet (5 mg total) by mouth every 12 (twelve) hours as needed for anxiety. 12/01/21   Johnson, Clanford L, MD  dicyclomine (BENTYL) 20 MG tablet Take 1 tablet (20 mg total) by mouth in the morning and at bedtime. 12/01/21   Johnson, Clanford L, MD  diphenhydrAMINE (BENADRYL) 2 % cream Apply topically 3 (three) times daily as needed for itching. 12/01/21   Johnson, Clanford L, MD  epoetin alfa-epbx (RETACRIT) 3000 UNIT/ML injection Inject 3,000 Units into the skin every 14 (fourteen) days. 11/21/20   Bhutani, Lavina Hamman, MD  esomeprazole (NEXIUM) 40 MG capsule Take 40 mg by mouth daily before breakfast.      [provider]  fenofibrate (TRICOR) 145 MG tablet Take 1 tablet (145 mg total) by mouth daily. 09/07/20   Arnoldo Lenis, MD  fish oil-omega-3 fatty acids 1000 MG capsule Take 1 g by mouth in the morning and at bedtime.    [provider]  furosemide (LASIX) 20 MG tablet Take 1 tablet (20 mg total) by mouth every other day. 12/04/21   Johnson, Clanford L, MD  gabapentin (NEURONTIN) 300 MG capsule Take 1 capsule (300 mg total) by mouth at bedtime. 12/01/21   Johnson, Clanford L, MD  insulin detemir (LEVEMIR) 100 UNIT/ML injection Inject 5 Units into the skin daily.    [provider]  levETIRAcetam (KEPPRA) 500 MG tablet Take 500 mg by mouth 2 (two) times daily.    [provider]  metoprolol succinate (TOPROL-XL) 50 MG 24 hr tablet Take 50 mg by mouth daily. Take with or immediately following a meal.    [provider]  ondansetron (ZOFRAN) 4 MG tablet Take 1 tablet (4 mg total) by mouth every 6 (six) hours as needed for nausea. 12/01/21   Johnson, Clanford L, MD  polyethylene glycol (MIRALAX / GLYCOLAX) 17 g packet Take 17 g by mouth daily. 12/02/21    Murlean Iba, MD  sodium bicarbonate 650 MG tablet Take 650 mg by mouth 3 (three) times daily. 10/05/21   [provider]  venlafaxine XR (EFFEXOR-XR) 75 MG 24 hr capsule Take 1 capsule (75 mg total) by mouth daily. 12/01/21   Murlean Iba, MD    I have reviewed the patient's current medications.  Labs: Renal Panel: Recent Labs    08/20/21 0400 08/20/21 1900 08/21/21 0445 08/22/21 0504 08/23/21 0443 08/24/21 0430 08/26/21 0435 08/27/21 0400 08/28/21 9211 08/29/21 0350 08/31/21 0500 09/01/21 0325 09/02/21 0254 09/03/21 0304 09/04/21 0300 09/05/21 0257 09/06/21 0153 11/25/21 1500 11/28/21 1437 11/30/21 0546 12/01/21 0624 03/16/22 0334  NA 134*   < > 135 136 140   < >  --  144 141   < > 130* 131* 131* 135 134* 134* 134* 137 138 138 137 134*  K 6.2*   < > 4.0 3.7 3.7   < >  --  2.8* 3.0*   < > 4.3 4.7 5.3* 5.1 5.3* 5.4* 5.4* 4.0 4.0 4.5 4.2 4.0  CL 106   < > 100 101 102   < >  --  101 100   < > 97* '100 100 106 105 103 105 107 107 106 106 '$ 108  CO2 14*   < > 21* 24 27   < >  --  29 28   < > '23 22 22 '$ 19* 19* 20* 18* 19* 20* 18* 19* 17*  GLUCOSE 146*   < > 126* 149* 130*   < >  --  220* 151*   < > 269* 157* 216* 127* 194* 183* 162* 180* 70 166* 184* 143*  BUN 89*   < > 47* 57* 40*   < >  --  76* 79*   < > 80* 77* 77* 73* 70* 68* 66* 42* 56* 53* 53* 43*  CREATININE 4.63*   < > 3.29* 3.71* 2.81*   < >  --  3.37* 3.88*   < > 4.93* 4.61* 4.51* 4.12* 3.85* 3.79* 3.60* 2.82* 3.87* 2.73* 2.78* 3.63*  CALCIUM 7.2*   < > 8.3* 8.8* 8.3*   < >  --  9.3 9.1   < > 8.8* 8.7* 9.0 8.9 8.8* 8.9 8.7* 8.3* 7.9* 8.0* 8.4* 8.5*  MG 2.1  --  1.9 2.0 2.0  --  2.0 1.6* 2.4  --   --   --   --   --   --   --   --   --   --  1.1* 1.9 1.3*  PHOS  --   --   --  5.3* 3.5   < >  --  3.4  3.4 4.1   < > 6.2* 6.3* 6.2* 6.0* 5.1* 5.3* 4.9*  --   --  4.3 4.2 5.8*  ALBUMIN  --   --   --  2.4* 2.7*   < >  --  2.3* 2.3*   < > 2.4* 2.4* 2.5* 2.6* 2.4* 2.7* 2.5*  --   --  2.4* 2.4* 2.6*   < > = values  in this interval not displayed.     CBC:    Latest Ref Rng & Units 03/16/2022    3:34 AM 12/01/2021    6:24 AM 11/30/2021    5:07 PM  CBC  WBC 4.0 - 10.5 K/uL 4.1   4.5     Hemoglobin 12.0 - 15.0 g/dL 6.9   7.8   8.0    Hematocrit 36.0 - 46.0 %  21.4   25.0   24.7    Platelets 150 - 400 K/uL 147   206        Anemia Panel:  Recent Labs    06/20/21 0954 07/19/21 1036 08/02/21 1033 08/02/21 1039 08/27/21 1529 08/28/21 0633 09/05/21 0757 09/06/21 4696 11/28/21 1750 11/29/21 0050 11/30/21 0546 11/30/21 1707 12/01/21 0624 03/16/22 0334  HGB 9.3*   < > 10.1*   < >  --    < >  --    < >  --  7.8* 7.1* 8.0* 7.8* 6.9*  MCV 96.2  --  96.3   < >  --    < >  --    < >  --   --  103.1*  --  96.5 97.3  VITAMINB12  --   --   --   --  1,417*  --   --   --  997*  --   --   --   --  952*  FOLATE  --   --   --   --   --   --   --   --  8.2  --   --   --   --  14.4  FERRITIN  --   --   --   --   --   --  517*  --  640*  --   --   --   --   --   TIBC 298  --  317  --   --   --  223*  --  211*  --   --   --   --  279  IRON 63  --  85  --   --   --  69  --  17*  --   --   --   --  49  RETICCTPCT  --   --   --   --   --   --   --   --  1.9  --   --   --   --   --    < > = values in this interval not displayed.    Recent Labs  Lab 03/16/22 0334  AST 47*  ALT 33  ALKPHOS 49  BILITOT 0.5  PROT 5.6*  ALBUMIN 2.6*    Lab Results  Component Value Date   HGBA1C 5.6 01/18/2022    ROS:  Pertinent items noted in HPI and remainder of comprehensive ROS otherwise negative.  Physical Exam: Vitals:   03/16/22 0631 03/16/22 0909  BP: (!) 147/68 (!) 172/90  Pulse: 75 73  Resp: 18 16  Temp: 97.6 F (36.4 C) (!) 97.4 F (36.3 C)  SpO2: 98% 100%     General exam: Appears calm and comfortable  Respiratory system: Clear to auscultation. Respiratory effort normal. No wheezing or crackle Cardiovascular system: S1 & S2 heard, RRR.  No pedal edema. Gastrointestinal system: Abdomen is  nondistended, soft and nontender. Normal bowel sounds heard. Central nervous system: Alert and oriented. No focal neurological deficits. Extremities: No edema, cyanosis or clubbing.. Skin: No rashes, lesions or ulcers Psychiatry: Judgement and insight appear normal. Mood & affect appropriate.   Assessment/Plan:  #Acute kidney injury on CKD stage IV versus progressive CKD in the setting of diabetes and hypertension.  She is nonoliguric.  The extensive serologies done previously negative and she follows with Dr. Theador Hawthorne as outpatient.  She required a brief period of dialysis in the past and  recent creatinine level seems to be around 3.  AKI presumably due to hemodynamically mediated and anemia.  No sign of hydronephrosis.  I will switch IV fluid to IV sodium bicarbonate.  Add albumin to increase intravascular volume.  Continue strict ins and outs and daily lab.  If creatinine level remains stable then patient can follow-up with her nephrologist outpatient.  No need for dialysis at this time.  #Severe anemia presumed due to CKD: No sign of bleeding.  Iron saturation 18%.  I will order IV iron and Aranesp.  She receives Procrit as outpatient.  #Hyponatremia: On IV fluid.  Monitor lab.  #Acute metabolic encephalopathy: Reportedly recent CT scan and MRI imaging without a stroke.  Do not think it is uremia as her BUN is around 40-50s which is chronic.  Mental status seems to have improved.  #Chronic diastolic CHF: Euvolemic on exam.  Gentle hydration as discussed above.  Continue metoprolol and cardiac medication.  #Hypertension: BP elevated.  On hydralazine, metoprolol.  #CKD-MBD: Currently on calcitriol.  Recommend renal low phosphorus diet.  Thank you for the consult.  Discussed with the primary team.  Jordi Lacko Tanna Furry 03/16/2022, 1:30 PM  [facility].

## 2022-03-16 NOTE — Assessment & Plan Note (Addendum)
   Patient additionally exhibiting increasing lethargy and confusion over the course of her hospitalization at OSH as renal function has worsened  Likely multifactorial  Holding any medications that may sedate the patient.    OSH med list reviewed, I have discontinued clonidine (can cause lethargy, will resume if pt. Develops rebound HTN),  gabapentin, citalopram, Effexor and Bentyl for now as all of these medications can potentially sedate the patient.  Obtaining urinalysis, ammonia, vitamin B12, TSH, inflammatory markers  CT and MRI imaging of the head at OSH revealed no acute process

## 2022-03-16 NOTE — Assessment & Plan Note (Signed)
.   Continuing home regimen of lipid lowering therapy.  

## 2022-03-16 NOTE — TOC Initial Note (Addendum)
Transition of Care [facility]) - Initial/Assessment Note    Patient Details  Name: Heather [name] MRN: [id number] Date of Birth: 01-Mar-1951  Transition of Care [facility]) CM/SW Contact:    Verdell Carmine, RN Phone Number: 03/16/2022, 1:21 PM  Clinical Narrative:                 71 Year old patient with a history of Vitamin B12 deciciency, Crohns and CKD III, transferred from [facility] for worsening acute Kidney injury superimposed on chronic kidney disease. Patient currently with a foley for strict I&O,  She lives at home with her husband, has been tired lately, has a decub on sacral area that [place] looked at.   She has had home health with [facility], and she stated she has had [place] this year with a agency from [place], but cannot think of the name. She has her husband and son to care for her. She has all DME needed.  [place]/PING revealed the last HH was [facility]. Patient does approve HH if recommended.  Not currently active with [facility]  or [facility].  TOC will follow for needs, recommendations, and transitions.  Expected Discharge Plan: [place] Barriers to Discharge: Continued Medical Work up   Patient Goals and CMS Choice        Expected Discharge Plan and Services Expected Discharge Plan: [place]   Discharge Planning Services: CM Consult   Living arrangements for the past 2 months: Single Family Home                                      Prior Living Arrangements/Services Living arrangements for the past 2 months: Single Family Home Lives with:: Spouse Patient language and need for interpreter reviewed:: Yes        Need for Family Participation in Patient Care: Yes (Comment) Care giver support system in place?: Yes (comment) Current home services: DME (Have recently had [facility] based in [place]) Criminal Activity/Legal Involvement Pertinent to Current Situation/Hospitalization: No - Comment as  needed  Activities of Daily Living Home Assistive Devices/Equipment: Eyeglasses, Environmental consultant (specify type) ADL Screening (condition at time of admission) Patient's cognitive ability adequate to safely complete daily activities?: No Is the patient deaf or have difficulty hearing?: No Does the patient have difficulty seeing, even when wearing glasses/contacts?: No Does the patient have difficulty concentrating, remembering, or making decisions?: Yes Patient able to express need for assistance with ADLs?: Yes Does the patient have difficulty dressing or bathing?: Yes Independently performs ADLs?: No Does the patient have difficulty walking or climbing stairs?: Yes Weakness of Legs: Both Weakness of Arms/Hands: Both  Permission Sought/Granted Permission sought to share information with : Case Manager                Emotional Assessment Appearance:: Appears stated age Attitude/Demeanor/Rapport: Gracious Affect (typically observed): Stoic Orientation: : Oriented to Self, Oriented to Place, Oriented to  Time, Oriented to Situation Alcohol / Substance Use: Not Applicable Psych Involvement: No (comment)  Admission diagnosis:  AKI (acute kidney injury) ([place]) [N17.9] Acute kidney injury ([place]) [N17.9] Patient Active Problem List   Diagnosis Date Noted   Mixed diabetic hyperlipidemia associated with type 2 diabetes mellitus ([place]) 03/16/2022   Chronic diastolic CHF (congestive heart failure) ([place]) 03/16/2022   Decubitus ulcer of left buttock, stage 2 ([place]) 03/16/2022   Cellulitis of left hand 11/30/2021  Closed fracture of part of upper end of humerus 11/30/2021   At risk for falls 11/28/2021   Neoplasm of meninges 11/28/2021   Sepsis ([place]) 11/28/2021   Acute on chronic anemia 11/28/2021   Cerebral thrombosis with cerebral infarction 08/28/2021   Cerebrovascular accident (CVA) due to embolism of cerebral artery ([place])    Pressure injury of skin 08/26/2021   Acute respiratory failure  with hypoxia ([place]) 97/98/9211   Acute metabolic encephalopathy 94/17/4081   Malignant hypertension 08/25/2021   Thrombocytopenia ([place]) 08/25/2021   Anemia due to chronic kidney disease 08/25/2021   Blurry vision, bilateral 08/16/2021   Hyponatremia 04/01/2021   COVID-19 03/30/2021   Senile osteoporosis 02/10/2021   Acute pancreatitis    Macrocytic anemia    E coli enteritis 01/09/2021   Acute on chronic pancreatitis ([place]) 01/08/2021   Acute renal failure superimposed on stage 3b chronic kidney disease ([place]) 01/07/2021   Diarrhea    Acute hypokalemia 01/06/2021   Long-term use of immunosuppressant medication-anticipated 11/28/2020   Elevated blood-pressure reading, without diagnosis of hypertension 05/30/2020   Meningioma ([place]) 05/30/2020   Headache 05/17/2020   Hypokalemia 05/17/2020   Hypomagnesemia 05/17/2020   Dehydration 05/17/2020   Hypertensive urgency 05/17/2020   Diplopia 05/17/2020   Left hip pain 09/22/2019   Lumbar radiculopathy 09/22/2019   Postoperative anemia due to acute blood loss 09/18/2019   Postoperative urinary retention 09/18/2019   Closed fracture of distal end of radius 05/26/2019   Coronary arteriosclerosis 07/15/2018   Closed right hip fracture ([place]) 04/08/2018   Elevated lipase    Crohn's disease of small intestine with other complication ([place])    Protein-calorie malnutrition, severe 03/05/2018   Neck pain 03/12/2017   Acute kidney injury superimposed on CKD ([place]) 01/25/2016   Neuropathy 03/02/2015   Pain in the chest    Chest pain 12/22/2014   Essential hypertension 12/22/2014   Uncontrolled type 2 diabetes mellitus with hypoglycemia, with long-term current use of insulin ([place]) 12/22/2014   Hiatal hernia 12/22/2014   GERD (gastroesophageal reflux disease) 12/22/2014   Gout 12/22/2014   Mixed hyperlipidemia 12/22/2014   Depression 12/22/2014   Type 2 diabetes mellitus without complications ([place]) 44/81/8563   Fusion of spine, cervicothoracic  region 11/01/2014   Pseudophakia, left eye 10/20/2014   Chronic kidney disease (CKD), stage IV (severe) ([place]) 08/25/2014   Cervical stenosis of spine 08/26/2013   Vitamin B12 deficiency 07/08/2011   Personal history of failed moderate sedation 04/16/2011   Chronic Nausea and Vomiting - ? gastroapresis 04/16/2011   Early satiety 04/16/2011   Irritable bowel syndrome 04/16/2011   Benign paroxysmal positional vertigo 11/20/2010   TRICUSPID REGURGITATION, MODERATE 11/20/2010   PULMONARY HYPERTENSION, MILD 11/20/2010   LEG EDEMA, BILATERAL 11/20/2010   DYSPNEA 11/20/2010   NONSPECIFIC ABNORMAL ELECTROCARDIOGRAM 11/20/2010   CHEST WALL PAIN, HX OF 11/20/2010   PCP:  [name], MD Pharmacy:   [facility], [place] [address] [place] [address] Phone: [phone] Fax: [place], [place] [address] [place] [address] Phone: [phone] Fax: [phone]     Social Determinants of Health (SDOH) Interventions    Readmission Risk Interventions    11/29/2021    2:37 PM 08/21/2021    2:48 PM 01/09/2021   11:25 AM  Readmission Risk Prevention Plan  Transportation Screening Complete Complete Complete  PCP or Specialist Appt within 3-5 Days   Complete  HRI or Home Care  Consult Complete  Complete  Social Work Consult for [place] Planning/Counseling Complete  Complete  Palliative Care Screening Not Applicable  Not Applicable  Medication Review Press photographer) Complete Complete Complete  HRI or Home Care Consult  Complete   SW Recovery Care/Counseling Consult  Complete   Palliative Care Screening  Not [place]  Not Applicable

## 2022-03-16 NOTE — Assessment & Plan Note (Signed)
.   Resume patients home regimen of oral antihypertensives . Titrate antihypertensive regimen as necessary to achieve adequate BP control . PRN intravenous antihypertensives for excessively elevated blood pressure   

## 2022-03-16 NOTE — Assessment & Plan Note (Signed)
   Present on admission  Local wound care with regular dressing changes.  Does not appear to be infected.  Wound care team consultation placed, their assistance is appreciated.

## 2022-03-16 NOTE — Assessment & Plan Note (Addendum)
   Hospital course at OSH was complicated by bouts of hypoglycemia in the past several days warranting temporary D10 infusion  Patient is somewhat hyperglycemic on arrival here on D5 infusion and therefore patient has been transitioned to lactated Ringer solution  Holding patient's usual home regimen of basal insulin therapy  Performing serial Accu-Cheks every 4 hours for now with extremely low-dose sliding scale which can be spaced out as we confirm that patient is not exhibiting any recurrent bouts of hypoglycemia.  Hemoglobin A1c 5.6% in March 2023

## 2022-03-16 NOTE — Assessment & Plan Note (Signed)
•   No clinical evidence of cardiogenic volume overload ° °

## 2022-03-16 NOTE — Assessment & Plan Note (Addendum)
   Patient presenting as a transfer from [facility] with acute kidney injury superimposed on chronic kidney disease stage IIIb, unclear etiology.  Obtaining urine electrolytes, urinalysis.  Renal ultrasound performed at OSH was unremarkable  Foley catheter has already been placed at OSH for strict I/O, will leave this in for now.  We will obtain formal nephrology consultation in the morning, patient follows with Dr.Bhutani with nephrology in the outpatient setting  Attempting to avoid nephrotoxic agents  Gentle intravenous hydration with lactated Ringer solution at 100 cc an hour for now  Continuing previous regimen of scheduled oral sodium bicarb  Patient was receiving Lasix 3 times daily at OSH, will hold this for now.

## 2022-03-16 NOTE — Consult Note (Signed)
WOC Nurse Consult Note: Patient receiving care in [facility] [phone] Reason for Consult: Sacral wound Wound type: Stage 2 coccyx pressure injury. Patient states it is very sore and hurts.  Pressure Injury POA: Yes Measurement: 2 cm x 2 cm x 0.1 cm Wound bed: 100% pink Drainage (amount, consistency, odor) None Periwound: intact Dressing procedure/placement/frequency: Continue sacral foam dressing. Assess every shift and replace every 3 days or PRN soiling.  Mattress replacement, ordered by Network engineer. Offload this area as much as possible. Up in chair at least 1-2 hours per day with redistribution chair pad.   Monitor the wound area(s) for worsening of condition such as: Signs/symptoms of infection, increase in size, development of or worsening of odor, development of pain, or increased pain at the affected locations.   Notify the medical team if any of these develop.  Thank you for the consult. [place] nurse will not follow at this time.   Please re-consult the [place] team if needed.  Cathlean Marseilles Tamala Julian, MSN, RN, [place], Lysle Pearl, [facility] Wound Treatment Associate Pager [phone]

## 2022-03-16 NOTE — Evaluation (Signed)
Physical Therapy Evaluation Patient Details Name: Heather [name] MRN: [id number] DOB: July 05, 1951 Today's Date: 03/16/2022  History of Present Illness  71 y/o female transferred from [facility] on 03/16/22 for AKI on CKD due to frequent hospitalizations for similar findings. PMH: CKD 3, Crohn's disease, hypertension, diabetes mellitus, BPPV stroke  Clinical Impression  Patient admitted for the above findings. Patient presents with weakness, decreased activity tolerance, and impaired balance. Patient requires use of IV pole for ambulation in hallway with supervision. Ambulated 2' with no AD but patient felt unstable and reaching for IV pole. She may benefit from rollator which per chart review, patient has one at home but unsure of accuracy. Patient will benefit from skilled PT services during acute stay to address listed deficits. Recommend HHPT at discharge to address balance and strength deficits in the home environment due to her hx of falls.        Recommendations for follow up therapy are one component of a multi-disciplinary discharge planning process, led by the attending physician.  Recommendations may be updated based on patient status, additional functional criteria and insurance authorization.  Follow Up Recommendations Home health PT    Assistance Recommended at Discharge Set up Supervision/Assistance  Patient can return home with the following       Equipment Recommendations Rollator (4 wheels) (patient may already have one at home; home info gleaned from previous admission)  Recommendations for Other Services       Functional Status Assessment Patient has had a recent decline in their functional status and demonstrates the ability to make significant improvements in function in a reasonable and predictable amount of time.     Precautions / Restrictions Precautions Precautions: Fall Restrictions Weight Bearing Restrictions: No      Mobility  Bed Mobility Overal  bed mobility: Modified Independent                  Transfers Overall transfer level: Needs assistance Equipment used: None Transfers: Sit to/from Stand Sit to Stand: Supervision           General transfer comment: initially bracing LEs against bed and reaching for bed rail. Supervision for safety    Ambulation/Gait Ambulation/Gait assistance: Supervision Gait Distance (Feet): 120 Feet Assistive device: IV Pole Gait Pattern/deviations: Step-through pattern, Decreased stride length Gait velocity: decreased     General Gait Details: supervision for safety. Ambulated 2' with no AD but unsteady and grasped IV pole for rest of ambulation. Not willing to try RW this session but would benefit from either RW or rollator  Stairs            Wheelchair Mobility    Modified Rankin (Stroke Patients Only)       Balance Overall balance assessment: Mild deficits observed, not formally tested, History of Falls                                           Pertinent Vitals/Pain Pain Assessment Pain Assessment: No/denies pain    Home Living Family/patient expects to be discharged to:: Private residence Living Arrangements: Spouse/significant other Available Help at Discharge: Family;Available 24 hours/day Type of Home: House Home Access: Ramped entrance       Home Layout: One level Home Equipment: Rollator (4 wheels);Crutches;Tub bench;Grab bars - tub/shower;Rolling Rhianon Zabawa (2 wheels);Cane - quad      Prior Function Prior Level of Function : Needs assist;History  of Falls (last six months)             Mobility Comments: ambulated around house without AD       Hand Dominance        Extremity/Trunk Assessment   Upper Extremity Assessment Upper Extremity Assessment: Overall WFL for tasks assessed    Lower Extremity Assessment Lower Extremity Assessment: Generalized weakness    Cervical / Trunk Assessment Cervical / Trunk Assessment:  Normal  Communication   Communication: No difficulties  Cognition Arousal/Alertness: Awake/alert Behavior During Therapy: WFL for tasks assessed/performed Overall Cognitive Status: Within Functional Limits for tasks assessed                                          General Comments      Exercises     Assessment/Plan    PT Assessment Patient needs continued PT services  PT Problem List Decreased strength;Decreased activity tolerance;Decreased balance;Decreased mobility;Decreased safety awareness       PT Treatment Interventions DME instruction;Gait training;Therapeutic activities;Functional mobility training;Therapeutic exercise;Balance training;Patient/family education    PT Goals (Current goals can be found in the Care Plan section)  Acute Rehab PT Goals Patient Stated Goal: to go home PT Goal Formulation: With patient Time For Goal Achievement: 03/30/22 Potential to Achieve Goals: Good    Frequency Min 3X/week     Co-evaluation               AM-PAC PT "6 Clicks" Mobility  Outcome Measure Help needed turning from your back to your side while in a flat bed without using bedrails?: None Help needed moving from lying on your back to sitting on the side of a flat bed without using bedrails?: None Help needed moving to and from a bed to a chair (including a wheelchair)?: A Little Help needed standing up from a chair using your arms (e.g., wheelchair or bedside chair)?: A Little Help needed to walk in hospital room?: A Little Help needed climbing 3-5 steps with a railing? : A Little 6 Click Score: 20    End of Session   Activity Tolerance: Patient tolerated treatment well Patient left: in bed;with call bell/phone within reach Nurse Communication: Mobility status PT Visit Diagnosis: Unsteadiness on feet (R26.81);Muscle weakness (generalized) (M62.81);History of falling (Z91.81)    Time: 3338-3291 PT Time Calculation (min) (ACUTE ONLY): 17  min   Charges:   PT Evaluation $PT Eval Moderate Complexity: 1 Mod          Sony Schlarb A. Gilford Rile PT, DPT Acute Rehabilitation Services Pager [phone] Office [phone]   Linna Hoff 03/16/2022, 5:12 PM

## 2022-03-16 NOTE — H&P (Addendum)
History and Physical    Patient: Heather [name] MRN: [id number] [place]: 03/16/2022  Date of Service: the patient was seen and examined on 03/16/2022  Patient coming from: Outside Hospital  Chief Complaint:  Renal Failure  HPI:   71 year old female with past medical history of vitamin B12 deficiency, Crohn's Disease, chronic kidney disease stage IIIb, insulin dependent diabetes mellitus type 2, hypertension, anemia, hyperlipidemia who presents to [facility] emergency department as a transfer due to progressively worsening acute kidney injury superimposed on chronic kidney disease.  Of note, patient was recently hospitalized at [facility] on 5/11 due to new onset of diplopia and speech changes.  Patient was hospitalized for stroke work-up.  MRI brain revealed no evidence of stroke with echocardiogram revealing a preserved ejection fraction with grade 1 DD.  Patient's difficulty with speech resolved although patient's diplopia seems to continue to wax and wane.    As the patient was being worked up patient was becoming more and more lethargic and confused.  TSH was found to be unremarkable.  COVID-19 PCR testing was found to be negative.  As the patient's confusion persisted this was associated with progressively worsening acute kidney injury superimposed on her chronic kidney disease.  Creatinine continued to trend upward to a peak of 3.88 (up from baseline of 2.3).  A Foley catheter was placed for strict I/O's.  Renal ultrasound performed on 5/17 was unremarkable.  Hospital course was also complicated by anemia of chronic disease requiring 1 unit packed red blood cell transfusion without any evidence of bleeding.  Hospital course was also complicated by recurrent bouts of hypoglycemia for which the patient was temporarily placed on a D10 infusion.  At this point due to persisting renal failure patient has been accepted for transfer for nephrology consultation.  Review of Systems:  Review of Systems  Constitutional:  Positive for malaise/fatigue.  Neurological:  Positive for weakness.  All other systems reviewed and are negative.   Past Medical History:  Diagnosis Date   Acute renal failure superimposed on stage 3 chronic kidney disease ([place]) 01/21/2016   Anemia    Anxiety    B12 deficiency    Brain tumor (benign) ([place]) 10/29/2020   Cellulitis and abscess    Abdomen and buttocks   Chronic diastolic CHF (congestive heart failure) ([place]) 03/16/2022   CKD (chronic kidney disease) stage 3, GFR 30-59 ml/min (HCC)    Closed fracture of distal end of femur, unspecified fracture morphology, initial encounter ([place])    Coronary arteriosclerosis 07/15/2018   Crohn disease ([place])    Depression    Diabetic neuropathy (HCC)    feet   DJD (degenerative joint disease)    Right forminal stenosis C4-5   Essential hypertension    Femur fracture, right ([place]) 94/17/4081   Folliculitis    GERD (gastroesophageal reflux disease)    Gout    Headache(784.0)    Hiatal hernia    History of blood transfusion    History of bronchitis    History of cardiac catheterization    No significant CAD 2012   History of kidney stones    surgery to remove   Insomnia    Irritable bowel syndrome    Mixed hyperlipidemia    Osteoarthritis    Palpitations    Pneumonia    Reflux esophagitis    Salmonella    Stroke [facility])    Tinnitus    Right   Type 2 diabetes mellitus ([place])     Past Surgical History:  Procedure Laterality Date   BACK SURGERY     c-spine surgery     03/2007   CARDIAC CATHETERIZATION     CHOLECYSTECTOMY     COLONOSCOPY  04/21/2011; 05/29/11   6/12: [place] - ?AVM at IC valve, inflammatory changes at IC valve [facility]); 7/12 - [place]; IC valve erosions, look chronic and probably Crohn's per path   colonoscopy  2017   [place]: random biopsies normal. TI normal   ESOPHAGOGASTRODUODENOSCOPY  05/29/11   normal   ESOPHAGOGASTRODUODENOSCOPY N/A 01/21/2016   Dr.  Michail Sermon: normal   EYE SURGERY Bilateral    cataracts removed   FEMUR IM NAIL Right 04/10/2018   Procedure: INTRAMEDULLARY (IM) NAIL FEMORAL;  Surgeon: Paralee Cancel, MD;  Location: [facility];  Service: Orthopedics;  Laterality: Right;   FRACTURE SURGERY     GIVENS CAPSULE STUDY  11/17/2020   HARDWARE REMOVAL Right 04/10/2018   Procedure: HARDWARE REMOVAL RIGHT DISTAL FEMUR;  Surgeon: Paralee Cancel, MD;  Location: [facility];  Service: Orthopedics;  Laterality: Right;   HERNIA REPAIR     IR FLUORO GUIDE CV LINE RIGHT  04/01/2017   IR US GUIDE VASC ACCESS RIGHT  04/01/2017   JOINT REPLACEMENT Right    hip   OPEN REDUCTION INTERNAL FIXATION (ORIF) DISTAL RADIAL FRACTURE Right 05/28/2019   Procedure: OPEN REDUCTION INTERNAL FIXATION (ORIF) DISTAL RADIAL FRACTURE;  Surgeon: Verner Mould, MD;  Location: [place];  Service: Orthopedics;  Laterality: Right;  with block 90 minutes   ORIF FEMUR FRACTURE Right 03/31/2017   Procedure: OPEN REDUCTION INTERNAL FIXATION (ORIF) DISTAL FEMUR FRACTURE;  Surgeon: Paralee Cancel, MD;  Location: [place];  Service: Orthopedics;  Laterality: Right;   removal of kidney stone     Right knee arthroscopic surgery     TONSILLECTOMY     TOTAL ABDOMINAL HYSTERECTOMY      Social History:  reports that she has never smoked. She has never used smokeless tobacco. She reports that she does not drink alcohol and does not use drugs.  Allergies  Allergen Reactions   Losartan Swelling, Rash and Nausea And Vomiting   Quinolones Swelling, Nausea Only and Nausea And Vomiting    Swelling of face, jaw, and lips Swelling of face, jaw, and lips   Duloxetine Hcl Swelling   Fluocinolone Swelling   Sulfa Antibiotics Swelling and Nausea And Vomiting    headache Swelling of feet, legs Swelling of feet, legs   Acetaminophen Itching and Swelling    Pt states "some swelling" Pt states "some swelling" Pt states "some swelling" Pt states "some swelling" Pt states "some swelling"    Amlodipine Besylate Swelling    Swelling of feet, fluid retention   Amlodipine Besylate Swelling   Ciprofloxacin Nausea And Vomiting and Swelling    Swelling of face, jaw, and lips   Duloxetine Swelling   Hydrocodone-Acetaminophen Itching   Hydrocodone-Acetaminophen Itching and Swelling   Ketamine Other (See Comments)   Metformin Other (See Comments)    REACTION: GI UPSET Other reaction(s): Stomach   Nsaids Other (See Comments)    "HAS BLEEDING ULCERS"   Sulfamethoxazole Swelling    Swelling of feet, legs   Valsartan Swelling   Lisinopril Swelling and Rash    Oral swelling, and red streaks on arms/stomach   Tape Itching and Rash    Paper tape can only be used on this patient    Family History  Problem Relation Age of Onset   Diabetes Mother    Colon cancer Brother  POSSIBLY. Patient states diagnosed at age 60, then later states age 40. unclear and limited historian   Esophageal cancer Neg Hx    Rectal cancer Neg Hx    Stomach cancer Neg Hx     Prior to Admission medications   Medication Sig Start Date End Date Taking? Authorizing Provider  acetaminophen (TYLENOL) 325 MG tablet Take 2 tablets (650 mg total) by mouth every 4 (four) hours. 12/01/21   Johnson, Clanford L, MD  allopurinol (ZYLOPRIM) 100 MG tablet Take 100 mg by mouth daily. 03/20/21   [provider]  Ascorbic Acid (VITAMIN C WITH ROSE HIPS) 500 MG tablet Take 500 mg by mouth every morning.    [provider]  calcitRIOL (ROCALTROL) 0.25 MCG capsule Take 0.25 mcg by mouth 3 (three) times a week. Monday,Wed and Fri. 10/05/21   [provider]  calcium carbonate (OS-CAL) 1250 (500 Ca) MG chewable tablet Chew 1 tablet by mouth 2 (two) times daily. 10/04/21 10/04/22  [provider]  Cholecalciferol (VITAMIN D) 2000 units CAPS Take 2,000 Units by mouth every morning.     [provider]  cyanocobalamin (,VITAMIN B-12,) 1000 MCG/ML injection Inject 1,000 mcg into the muscle  every 30 (thirty) days.      [provider]  diazepam (VALIUM) 5 MG tablet Take 1 tablet (5 mg total) by mouth every 12 (twelve) hours as needed for anxiety. 12/01/21   Johnson, Clanford L, MD  dicyclomine (BENTYL) 20 MG tablet Take 1 tablet (20 mg total) by mouth in the morning and at bedtime. 12/01/21   Johnson, Clanford L, MD  diphenhydrAMINE (BENADRYL) 2 % cream Apply topically 3 (three) times daily as needed for itching. 12/01/21   Johnson, Clanford L, MD  epoetin alfa-epbx (RETACRIT) 3000 UNIT/ML injection Inject 3,000 Units into the skin every 14 (fourteen) days. 11/21/20   Bhutani, Lavina Hamman, MD  esomeprazole (NEXIUM) 40 MG capsule Take 40 mg by mouth daily before breakfast.      [provider]  fenofibrate (TRICOR) 145 MG tablet Take 1 tablet (145 mg total) by mouth daily. 09/07/20   Arnoldo Lenis, MD  fish oil-omega-3 fatty acids 1000 MG capsule Take 1 g by mouth in the morning and at bedtime.    [provider]  furosemide (LASIX) 20 MG tablet Take 1 tablet (20 mg total) by mouth every other day. 12/04/21   Johnson, Clanford L, MD  gabapentin (NEURONTIN) 300 MG capsule Take 1 capsule (300 mg total) by mouth at bedtime. 12/01/21   Johnson, Clanford L, MD  insulin detemir (LEVEMIR) 100 UNIT/ML injection Inject 5 Units into the skin daily.    [provider]  levETIRAcetam (KEPPRA) 500 MG tablet Take 500 mg by mouth 2 (two) times daily.    [provider]  metoprolol succinate (TOPROL-XL) 50 MG 24 hr tablet Take 50 mg by mouth daily. Take with or immediately following a meal.    [provider]  ondansetron (ZOFRAN) 4 MG tablet Take 1 tablet (4 mg total) by mouth every 6 (six) hours as needed for nausea. 12/01/21   Johnson, Clanford L, MD  polyethylene glycol (MIRALAX / GLYCOLAX) 17 g packet Take 17 g by mouth daily. 12/02/21   Johnson, Clanford L, MD  sodium bicarbonate 650 MG tablet Take 650 mg by mouth 3 (three) times daily. 10/05/21   [provider]  venlafaxine XR (EFFEXOR-XR) 75 MG 24 hr capsule Take 1 capsule (75 mg total) by mouth daily. 12/01/21  Murlean Iba, MD    Physical Exam:  Vitals:   03/16/22 0000  BP: 130/69  Pulse: 79  Resp: 16  Temp: (!) 97.5 F (36.4 C)  TempSrc: Oral  SpO2: 98%    Constitutional: Lethargic but arousable and and oriented x3, no associated distress.   Skin: 2cm diameter stage II ulceration over right buttock, present on admission.  no rashes, no lesions, poor skin turgor noted. Eyes: Pupils are equally reactive to light.  No evidence of scleral icterus or conjunctival pallor.  ENMT: Moist mucous membranes noted.  Posterior pharynx clear of any exudate or lesions.   Neck: normal, supple, no masses, no thyromegaly.  No evidence of jugular venous distension.   Respiratory: Diminished breath sounds at the bases B/L.  Scattered ronchi.   no wheezing, no crackles. Normal respiratory effort. No accessory muscle use.  Cardiovascular: Regular rate and rhythm, no murmurs / rubs / gallops. No extremity edema. 2+ pedal pulses. No carotid bruits.  Chest:   Nontender without crepitus or deformity.   Back:   Nontender without crepitus or deformity. Abdomen: Abdomen is soft and nontender.  No evidence of intra-abdominal masses.  Positive bowel sounds noted in all quadrants.   Musculoskeletal: Right shoulder pain with both passive and active range of motion.  No joint deformity upper and lower extremities. no contractures. Normal muscle tone.  Neurologic: Lethargic but arousable.  CN 2-12 grossly intact. Sensation intact.  Patient moving all 4 extremities spontaneously.  Patient is following all commands.  Patient is responsive to verbal stimuli.  No asterixis.  Psychiatric: Patient exhibits normal mood with flat affect.  Patient does still seem to possess insight as to their current situation.    Data Reviewed:  I have personally reviewed and interpreted labs, imaging.  Significant  findings are (from OSH):  Sodium 130 BUN 47 creatinine 3.69.   Hemoglobin 7.4, MCV 106.7, white blood cell count 5.0.   EKG: Personally reviewed.  Rhythm is NSR with heart rate of 79BPM.  No dynamic ST segment changes appreciated.   Assessment and Plan: * Acute renal failure superimposed on stage 3b chronic kidney disease [facility]) Patient presenting as a transfer from [facility] with acute kidney injury superimposed on chronic kidney disease stage IIIb, unclear etiology. Obtaining urine electrolytes, urinalysis. Renal ultrasound performed at OSH was unremarkable Foley catheter has already been placed at OSH for strict I/O, will leave this in for now. We will obtain formal nephrology consultation in the morning, patient follows with Dr.Bhutani with nephrology in the outpatient setting Attempting to avoid nephrotoxic agents Gentle intravenous hydration with lactated Ringer solution at 100 cc an hour for now Continuing previous regimen of scheduled oral sodium bicarb Patient was receiving Lasix 3 times daily at OSH, will hold this for now.  Acute metabolic encephalopathy Patient additionally exhibiting increasing lethargy and confusion over the course of her hospitalization at OSH as renal function has worsened Likely multifactorial Holding any medications that may sedate the patient.   OSH med list reviewed, I have discontinued clonidine (can cause lethargy, will resume if pt. Develops rebound HTN),  gabapentin, citalopram, Effexor and Bentyl for now as all of these medications can potentially sedate the patient. Obtaining urinalysis, ammonia, vitamin B12, TSH, inflammatory markers CT and MRI imaging of the head at OSH revealed no acute process  Uncontrolled type 2 diabetes mellitus with hypoglycemia, with long-term current use of insulin [facility]) Hospital course at OSH was complicated by bouts of hypoglycemia in the past several days  warranting temporary D10 infusion Patient is somewhat  hyperglycemic on arrival here on D5 infusion and therefore patient has been transitioned to lactated Ringer solution Holding patient's usual home regimen of basal insulin therapy Performing serial Accu-Cheks every 4 hours for now with extremely low-dose sliding scale which can be spaced out as we confirm that patient is not exhibiting any recurrent bouts of hypoglycemia. Hemoglobin A1c 5.6% in March 2023  Macrocytic anemia Likely multifactorial due to anemia of chronic kidney disease with possible superimposed vitamin B12 deficiency (patent has a history of b12 deficiency) Obtaining repeat iron panel, vit B12, folate Obtaining type and screen No clinical evidence of bleeding Monitoring hemoglobin and hematocrit with serial CBCs  Essential hypertension Resume patients home regimen of oral antihypertensives Titrate antihypertensive regimen as necessary to achieve adequate BP control PRN intravenous antihypertensives for excessively elevated blood pressure    Chronic diastolic CHF (congestive heart failure) (HCC) No clinical evidence of cardiogenic volume overload   Mixed diabetic hyperlipidemia associated with type 2 diabetes mellitus ([place]) Continuing home regimen of lipid lowering therapy.    Decubitus ulcer of left buttock, stage 2 ([place]) Present on admission Local wound care with regular dressing changes. Does not appear to be infected. Wound care team consultation placed, their assistance is appreciated.       Code Status:  DNR  code status decision has been confirmed with: Patient.  I believe patient possesses the capacity to make this decision at the time of interview.   Family Communication: Deferred   Consults: Will consult Nephrology on day shift.   Severity of Illness:  The appropriate patient status for this patient is INPATIENT. Inpatient status is judged to be reasonable and necessary in order to provide the required intensity of service to ensure the patient's  safety. The patient's presenting symptoms, physical exam findings, and initial radiographic and laboratory data in the context of their chronic comorbidities is felt to place them at high risk for further clinical deterioration. Furthermore, it is not anticipated that the patient will be medically stable for discharge from the hospital within 2 midnights of admission.   * I certify that at the point of admission it is my clinical judgment that the patient will require inpatient hospital care spanning beyond 2 midnights from the point of admission due to high intensity of service, high risk for further deterioration and high frequency of surveillance required.*  Author:  Vernelle Emerald MD  03/16/2022 3:50 AM

## 2022-03-16 NOTE — Progress Notes (Signed)
EKG performed as ordered, NSR. Attempt made x2 to contact pt's husband and son for consent for blood due to pt's AMS.

## 2022-03-16 NOTE — Progress Notes (Signed)
PROGRESS NOTE  Heather [name]  DOB: [birth year]  PCP: [name], MD WUJ:[id number]  DOA: 03/16/2022  LOS: 0 days  Hospital Day: 1  Brief narrative: Heather [name] is a 71 y.o. female with PMH significant for DM2, HTN, HLD, stroke, chronic diastolic CHF, CKD, Crohn's disease, chronic anemia, vitamin B-12 deficiency, anxiety/depression, GERD, irritable bowel syndrome, insomnia, osteoarthritis. Recently hospitalized at [facility] on 5/11 for new onset diplopia and speech changes, suspected off of stroke but MRI brain was negative.  Eventually her speech resolved but she continued to have waxing and waning diplopia.  During the course of hospitalization, patient became more confused and lethargic.  Work-up included negative TSH, negative COVID-19 test.  She was noted to have uptrending creatinine from a baseline of 2.8 to a peak of 3.88.  Renal ultrasound on 5/17 was unremarkable.  Foley catheter was placed for urine output monitoring.  She had a drop in hemoglobin requiring 1 unit of blood transfusion.  Hospital course was also complicated by multiple bouts of hypoglycemia for which she was temporarily placed on D10 infusion. Because of progressively worsening renal failure, patient was transferred to [facility] on 5/18 for nephrology consultation.  Initial labs done here showed sodium 134, serum bicarb 17, BUN/creatinine 43/3.63, phosphorus elevated to 5.8, magnesium low at 1.3, CRP elevated to 3.6, ESR slightly elevated to 28 WBC normal at 4.1, hemoglobin low at 6.9, platelet slightly low at 147 Urinalysis with hazy yellow urine with negative nitrite, trace leukocytes Admitted to hospitalist service.  Subjective: Patient was seen and examined this morning.  Pleasant elderly Caucasian female.  Not in distress.  Slow to respond, oriented to place and person.  No family at bedside Not in physical distress. Foley catheter with slightly concentrated yellow urine Chart reviewed No  fever, blood pressure 140s this morning   Principal Problem:   Acute renal failure superimposed on stage 3b chronic kidney disease (HCC) Active Problems:   Acute metabolic encephalopathy   Uncontrolled type 2 diabetes mellitus with hypoglycemia, with long-term current use of insulin (HCC)   Macrocytic anemia   Essential hypertension   Chronic diastolic CHF (congestive heart failure) (HCC)   Mixed diabetic hyperlipidemia associated with type 2 diabetes mellitus ([place])   Decubitus ulcer of left buttock, stage 2 (HCC)    Assessment and Plan: AKI on CKD 3B -Baseline creatinine 2.8 from February. -Creatinine today 3.63.  Per report, renal ultrasound done at [facility] on 5/17 was unremarkable -Follows up with Dr. Theador Hawthorne as an outpatient. -Currently on LR at 100 mill per hour -Avoid diuretics and nephrotoxic medicines at this time. -Continue to monitor. Foley catheter in place Recent Labs    09/02/21 0254 09/03/21 0304 09/04/21 0300 09/05/21 0257 09/06/21 0153 11/25/21 1500 11/28/21 1437 11/30/21 0546 12/01/21 0624 03/16/22 0334  BUN 77* 73* 70* 68* 66* 42* 56* 53* 53* 43*  CREATININE 4.51* 4.12* 3.85* 3.79* 3.60* 2.82* 3.87* 2.73* 2.78* 3.63*   Acute on chronic anemia -Baseline hemoglobin seems to be close to 8.  Per report patient received 1 unit of PRBC at [facility].  Hemoglobin was 6.9 at presentation last night.  1 unit PRBC was given. -No active bleeding at this time.  Anemia panel unremarkable as below.  Recheck CBC tomorrow. Recent Labs    06/20/21 9562 07/19/21 1036 08/02/21 1033 08/02/21 1039 08/27/21 1529 08/28/21 1308 09/05/21 0757 09/06/21 6578 11/28/21 1750 11/29/21 0050 11/30/21 0546 11/30/21 1707 12/01/21 0624 03/16/22 0334  HGB 9.3*   < >  10.1*   < >  --    < >  --    < >  --  7.8* 7.1* 8.0* 7.8* 6.9*  MCV 96.2  --  96.3   < >  --    < >  --    < >  --   --  103.1*  --  96.5 97.3  VITAMINB12  --   --   --   --  1,417*  --   --   --  997*   --   --   --   --  952*  FOLATE  --   --   --   --   --   --   --   --  8.2  --   --   --   --  14.4  FERRITIN  --   --   --   --   --   --  517*  --  640*  --   --   --   --   --   TIBC 298  --  317  --   --   --  223*  --  211*  --   --   --   --  279  IRON 63  --  85  --   --   --  69  --  17*  --   --   --   --  49  RETICCTPCT  --   --   --   --   --   --   --   --  1.9  --   --   --   --   --    < > = values in this interval not displayed.   Acute metabolic encephalopathy -Per report, patient's mental status and weakness progressively worsened during the hospitalization at [facility]  -Probably multifactorial including worsening AKI, sedative meds, hospital induced delirium.   -Per report, CT head and MRI imaging at the previous hospital did not show any acute findings. -Urinalysis without any evidence of infection.   -Currently on hold her gabapentin, citalopram, Valium, Effexor, Bentyl.   -Continue to monitor mental status.  Type 2 diabetes mellitus Recurrent hypoglycemia -A1c 5.6 from 3/23 -Per report, patient had multiple bouts of hypoglycemia at the previous hospital requiring temporary D10 infusion.  Probably due to poor oral intake -Currently on sliding scale insulin and Accu-Cheks. -Blood sugar level remains normal this morning.  Continue to monitor Recent Labs  Lab 03/16/22 0712  GLUCAP 118*    Chronic diastolic CHF  Essential hypertension -Blood pressure in 140s.  Remains euvolemic.  -Home BP meds include Toprol 50 mg daily, Lasix 20 mg every other day. -Continue metoprolol.  Keep Lasix on hold. -Hydralazine as needed  Hyperlipidemia -Continue Tricor   Stage II sacral pressure ulcer -POA -Does not appear infected.  Continue local wound care.    Goals of care   Code Status: DNR    Mobility: PT eval ordered  Skin assessment: There is a mention of sacral ulcer at presentation.    Nutritional status:  There is no height or weight on file to calculate BMI.           Diet:  Diet Order             Diet heart healthy/carb modified Room service appropriate? Yes; Fluid consistency: Thin  Diet effective now  DVT prophylaxis:  heparin injection 5,000 Units Start: 03/16/22 1400   Antimicrobials: None at this time Fluid: LR at 100 mill per hour Consultants: None Family Communication: None at bedside  Status is: Inpatient  Continue in-hospital care because: Needs IV fluid, renal function monitoring Level of care: Telemetry Medical   Dispo: The patient is from: Home              Anticipated d/c is to: Pending clinical course, pending PT eval              Patient currently is not medically stable to d/c.   Difficult to place patient No     Infusions:   lactated ringers 100 mL/hr at 03/16/22 0118    Scheduled Meds:  allopurinol  100 mg Oral Daily   atorvastatin  80 mg Oral Daily   calcitRIOL  0.25 mcg Oral Once per day on Mon Wed Fri   fenofibrate  160 mg Oral Daily   heparin  5,000 Units Subcutaneous Q8H   hydrALAZINE  25 mg Oral Q8H   insulin aspart  0-6 Units Subcutaneous TID WC   metoprolol succinate  50 mg Oral Daily   pantoprazole  40 mg Oral Daily   sodium bicarbonate  650 mg Oral TID    PRN meds: hydrALAZINE, ondansetron **OR** ondansetron (ZOFRAN) IV, polyethylene glycol   Antimicrobials: Anti-infectives (From admission, onward)    None       Objective: Vitals:   03/16/22 0631 03/16/22 0909  BP: (!) 147/68 (!) 172/90  Pulse: 75 73  Resp: 18 16  Temp: 97.6 F (36.4 C) (!) 97.4 F (36.3 C)  SpO2: 98% 100%    Intake/Output Summary (Last 24 hours) at 03/16/2022 1042 Last data filed at 03/16/2022 2518 Gross per 24 hour  Intake 320 ml  Output 400 ml  Net -80 ml   There were no vitals filed for this visit. Weight change:  There is no height or weight on file to calculate BMI.   Physical Exam: General exam: Pleasant, elderly Caucasian female.  Not in physical distress Skin: No  rashes, lesions or ulcers. HEENT: Atraumatic, normocephalic, no obvious bleeding Lungs: Clear to auscultation bilaterally CVS: Regular rate and rhythm, no murmur GI/Abd soft, nontender, nondistended, bowel sound present CNS: Alert, awake, oriented to place and person only Psychiatry: Sad affect Extremities: No pedal edema, no calf tenderness  Data Review: I have personally reviewed the laboratory data and studies available.  F/u labs ordered Unresulted Labs (From admission, onward)     Start     Ordered   03/17/22 9842  Basic metabolic panel  Daily,   R      03/16/22 1042   03/17/22 0500  CBC with Differential/Platelet  Daily,   R      03/16/22 1042   03/16/22 0059  Urea nitrogen, urine  Once,   R        03/16/22 0059   Unscheduled  Occult blood card to lab, stool  As needed,   R      03/16/22 0419            Signed, Terrilee Croak, MD [facility] 03/16/2022

## 2022-03-17 DIAGNOSIS — L89322 Pressure ulcer of left buttock, stage 2: Secondary | ICD-10-CM | POA: Diagnosis not present

## 2022-03-17 DIAGNOSIS — D649 Anemia, unspecified: Secondary | ICD-10-CM | POA: Diagnosis not present

## 2022-03-17 DIAGNOSIS — I130 Hypertensive heart and chronic kidney disease with heart failure and stage 1 through stage 4 chronic kidney disease, or unspecified chronic kidney disease: Secondary | ICD-10-CM | POA: Diagnosis not present

## 2022-03-17 DIAGNOSIS — E782 Mixed hyperlipidemia: Secondary | ICD-10-CM | POA: Diagnosis not present

## 2022-03-17 DIAGNOSIS — I5032 Chronic diastolic (congestive) heart failure: Secondary | ICD-10-CM | POA: Diagnosis not present

## 2022-03-17 DIAGNOSIS — N179 Acute kidney failure, unspecified: Secondary | ICD-10-CM | POA: Diagnosis present

## 2022-03-17 DIAGNOSIS — G9341 Metabolic encephalopathy: Secondary | ICD-10-CM | POA: Diagnosis not present

## 2022-03-17 DIAGNOSIS — N1832 Chronic kidney disease, stage 3b: Secondary | ICD-10-CM | POA: Diagnosis not present

## 2022-03-17 DIAGNOSIS — N184 Chronic kidney disease, stage 4 (severe): Secondary | ICD-10-CM | POA: Diagnosis not present

## 2022-03-17 DIAGNOSIS — E871 Hypo-osmolality and hyponatremia: Secondary | ICD-10-CM | POA: Diagnosis not present

## 2022-03-17 DIAGNOSIS — D631 Anemia in chronic kidney disease: Secondary | ICD-10-CM | POA: Diagnosis not present

## 2022-03-17 DIAGNOSIS — E11649 Type 2 diabetes mellitus with hypoglycemia without coma: Secondary | ICD-10-CM | POA: Diagnosis not present

## 2022-03-17 LAB — CBC WITH DIFFERENTIAL/PLATELET
Abs Immature Granulocytes: 0.05 10*3/uL (ref 0.00–0.07)
Basophils Absolute: 0.1 10*3/uL (ref 0.0–0.1)
Basophils Relative: 1 %
Eosinophils Absolute: 0.2 10*3/uL (ref 0.0–0.5)
Eosinophils Relative: 4 %
HCT: 28.3 % — ABNORMAL LOW (ref 36.0–46.0)
Hemoglobin: 9.1 g/dL — ABNORMAL LOW (ref 12.0–15.0)
Immature Granulocytes: 1 %
Lymphocytes Relative: 9 %
Lymphs Abs: 0.5 10*3/uL — ABNORMAL LOW (ref 0.7–4.0)
MCH: 30.3 pg (ref 26.0–34.0)
MCHC: 32.2 g/dL (ref 30.0–36.0)
MCV: 94.3 fL (ref 80.0–100.0)
Monocytes Absolute: 0.4 10*3/uL (ref 0.1–1.0)
Monocytes Relative: 7 %
Neutro Abs: 4.5 10*3/uL (ref 1.7–7.7)
Neutrophils Relative %: 78 %
Platelets: 198 10*3/uL (ref 150–400)
RBC: 3 MIL/uL — ABNORMAL LOW (ref 3.87–5.11)
RDW: 14.8 % (ref 11.5–15.5)
WBC: 5.7 10*3/uL (ref 4.0–10.5)
nRBC: 0 % (ref 0.0–0.2)

## 2022-03-17 LAB — TYPE AND SCREEN
ABO/RH(D): O POS
Antibody Screen: NEGATIVE
Unit division: 0

## 2022-03-17 LAB — BASIC METABOLIC PANEL
Anion gap: 10 (ref 5–15)
BUN: 39 mg/dL — ABNORMAL HIGH (ref 8–23)
CO2: 19 mmol/L — ABNORMAL LOW (ref 22–32)
Calcium: 9.1 mg/dL (ref 8.9–10.3)
Chloride: 108 mmol/L (ref 98–111)
Creatinine, Ser: 2.93 mg/dL — ABNORMAL HIGH (ref 0.44–1.00)
GFR, Estimated: 17 mL/min — ABNORMAL LOW (ref >60)
Glucose, Bld: 164 mg/dL — ABNORMAL HIGH (ref 70–99)
Potassium: 4.1 mmol/L (ref 3.5–5.1)
Sodium: 137 mmol/L (ref 135–145)

## 2022-03-17 LAB — BPAM RBC
Blood Product Expiration Date: 202306072359
ISSUE DATE / TIME: 202305190609
Unit Type and Rh: 5100

## 2022-03-17 LAB — GLUCOSE, CAPILLARY
Glucose-Capillary: 170 mg/dL — ABNORMAL HIGH (ref 70–99)
Glucose-Capillary: 158 mg/dL — ABNORMAL HIGH (ref 70–99)

## 2022-03-17 LAB — UREA NITROGEN, URINE: Urea Nitrogen, Ur: 295 mg/dL

## 2022-03-17 MED ORDER — MELATONIN 5 MG PO TABS
5.0000 mg | ORAL_TABLET | Freq: Once | ORAL | Status: AC
  Administered 2022-03-17: 5 mg via ORAL
  Filled 2022-03-17: qty 1

## 2022-03-17 MED ORDER — SODIUM BICARBONATE 650 MG PO TABS
650.0000 mg | ORAL_TABLET | Freq: Three times a day (TID) | ORAL | Status: DC
  Administered 2022-03-17: 650 mg via ORAL
  Filled 2022-03-17: qty 1

## 2022-03-17 MED ORDER — CHLORHEXIDINE GLUCONATE CLOTH 2 % EX PADS: 6.0000 | MEDICATED_PAD | Freq: Every day | CUTANEOUS | Status: DC

## 2022-03-17 NOTE — Progress Notes (Signed)
[name] to be discharged Home per MD order. Discussed prescriptions and follow up appointments with the patient. Prescriptions given to patient and family medication list explained in detail. Patient and family verbalized understanding.  Skin clean, dry and intact without evidence of skin break down, no evidence of skin tears noted. IV catheter discontinued intact. Site without signs and symptoms of complications. Dressing and pressure applied. Pt denies pain at the site currently. No complaints noted.  Patient free of lines, drains, and wounds.   An After Visit Summary (AVS) was printed and given to the patient. Patient escorted via wheelchair, and discharged home via private auto.  Amaryllis Dyke, RN

## 2022-03-17 NOTE — Progress Notes (Addendum)
[facility] PROGRESS NOTE  Assessment/ Plan: Pt is a 71 y.o. yo female  with past medical history significant for hypertension, HLD, diabetes, stroke, chronic diastolic CHF, chron's disease, CKD stage IV, transferred from outside hospital for the evaluation of AKI on CKD.  #Acute kidney injury on CKD stage IV due to hemodynamically mediated and severe anemia. CKD thought to be due to diabetes and hypertension. The extensive serologies done previously negative and she follows with Dr. Theador Hawthorne as outpatient.  She required a brief period of dialysis in the past and recent creatinine level seems to be around 3.  She has increased urine output and a serum creatinine level improved to her baseline with IV hydration.  She is clinically improved.  I will discontinue IV fluid.  Can resume oral Lasix 40 mg as outpatient.  No need for dialysis at this time and she will continue to follow with Dr. Theador Hawthorne.  No need for dialysis.    #Severe anemia presumed due to CKD: No sign of bleeding.  Iron saturation 18%.  Ordered IV iron and Aranesp.  She receives Procrit as outpatient.   #Hyponatremia: Improved with IV fluid.   #Acute metabolic encephalopathy: Reportedly recent CT scan and MRI imaging without a stroke.  Do not think it is uremia as her BUN is around 40-50s which is chronic.  Mental status has improved to her baseline.  #Metabolic acidosis: Switching to oral sodium bicarbonate.   #Chronic diastolic CHF: Euvolemic on exam.  Continue metoprolol and cardiac medication.  Resume Lasix as outpatient.   #Hypertension: BP elevated.  On hydralazine, metoprolol.   #CKD-MBD: Currently on calcitriol.  Recommend renal low phosphorus diet.  Sign off, please call back with question.  Subjective: Seen and examined.  Urine output is around 2.2 L.  She is alert awake and denies any complaint.  Denies nausea, vomiting, chest pain, shortness of breath.  She is eager to go home  today. Objective Vital signs in last 24 hours: Vitals:   03/16/22 1700 03/16/22 2036 03/17/22 0545 03/17/22 0649  BP: (!) 178/74 (!) 183/77 (!) 194/85 (!) 172/107  Pulse: 92 98 90 98  Resp: '19 18 19   '$ Temp: 98.4 F (36.9 C) 99 F (37.2 C) 98.3 F (36.8 C)   TempSrc:      SpO2: 99% 94% 96%    Weight change:   Intake/Output Summary (Last 24 hours) at 03/17/2022 0938 Last data filed at 03/17/2022 0418 Gross per 24 hour  Intake 1849.83 ml  Output 2250 ml  Net -400.17 ml       Labs: RENAL PANEL Recent Labs    08/20/21 0400 08/20/21 1900 08/21/21 0445 08/22/21 0504 08/23/21 0443 08/24/21 0430 08/26/21 0435 08/27/21 0400 08/28/21 2094 08/29/21 0350 08/31/21 0500 09/01/21 0325 09/02/21 0254 09/03/21 0304 09/04/21 0300 09/05/21 0257 09/06/21 0153 11/25/21 1500 11/28/21 1437 11/30/21 0546 12/01/21 0624 03/16/22 0334 03/17/22 0455  NA 134*   < > 135 136 140   < >  --  144 141   < > 130* 131* 131* 135 134* 134* 134* 137 138 138 137 134* 137  K 6.2*   < > 4.0 3.7 3.7   < >  --  2.8* 3.0*   < > 4.3 4.7 5.3* 5.1 5.3* 5.4* 5.4* 4.0 4.0 4.5 4.2 4.0 4.1  CL 106   < > 100 101 102   < >  --  101 100   < > 97* 100 100 106 105 103 105 107  107 106 106 108 108  CO2 14*   < > 21* 24 27   < >  --  29 28   < > '23 22 22 '$ 19* 19* 20* 18* 19* 20* 18* 19* 17* 19*  GLUCOSE 146*   < > 126* 149* 130*   < >  --  220* 151*   < > 269* 157* 216* 127* 194* 183* 162* 180* 70 166* 184* 143* 164*  BUN 89*   < > 47* 57* 40*   < >  --  76* 79*   < > 80* 77* 77* 73* 70* 68* 66* 42* 56* 53* 53* 43* 39*  CREATININE 4.63*   < > 3.29* 3.71* 2.81*   < >  --  3.37* 3.88*   < > 4.93* 4.61* 4.51* 4.12* 3.85* 3.79* 3.60* 2.82* 3.87* 2.73* 2.78* 3.63* 2.93*  CALCIUM 7.2*   < > 8.3* 8.8* 8.3*   < >  --  9.3 9.1   < > 8.8* 8.7* 9.0 8.9 8.8* 8.9 8.7* 8.3* 7.9* 8.0* 8.4* 8.5* 9.1  MG 2.1  --  1.9 2.0 2.0  --  2.0 1.6* 2.4  --   --   --   --   --   --   --   --   --   --  1.1* 1.9 1.3*  --   PHOS  --   --   --   5.3* 3.5   < >  --  3.4  3.4 4.1   < > 6.2* 6.3* 6.2* 6.0* 5.1* 5.3* 4.9*  --   --  4.3 4.2 5.8*  --   ALBUMIN  --   --   --  2.4* 2.7*   < >  --  2.3* 2.3*   < > 2.4* 2.4* 2.5* 2.6* 2.4* 2.7* 2.5*  --   --  2.4* 2.4* 2.6*  --    < > = values in this interval not displayed.     Liver Function Tests: Recent Labs  Lab 03/16/22 0334  AST 47*  ALT 33  ALKPHOS 49  BILITOT 0.5  PROT 5.6*  ALBUMIN 2.6*   No results for input(s): LIPASE, AMYLASE in the last 168 hours. Recent Labs  Lab 03/16/22 0334  AMMONIA 24   CBC: Recent Labs    06/20/21 0954 07/19/21 1036 08/02/21 1033 08/02/21 1039 08/27/21 1529 08/28/21 6734 09/05/21 0757 09/06/21 1937 11/28/21 1750 11/29/21 0050 11/30/21 0546 11/30/21 1707 12/01/21 0624 03/16/22 0334  HGB 9.3*   < > 10.1*   < >  --    < >  --    < >  --  7.8* 7.1* 8.0* 7.8* 6.9*  MCV 96.2  --  96.3   < >  --    < >  --    < >  --   --  103.1*  --  96.5 97.3  VITAMINB12  --   --   --   --  1,417*  --   --   --  997*  --   --   --   --  952*  FOLATE  --   --   --   --   --   --   --   --  8.2  --   --   --   --  14.4  FERRITIN  --   --   --   --   --   --  517*  --  640*  --   --   --   --   --   TIBC 298  --  317  --   --   --  223*  --  211*  --   --   --   --  279  IRON 63  --  85  --   --   --  69  --  17*  --   --   --   --  49  RETICCTPCT  --   --   --   --   --   --   --   --  1.9  --   --   --   --   --    < > = values in this interval not displayed.    Cardiac Enzymes: No results for input(s): CKTOTAL, CKMB, CKMBINDEX, TROPONINI in the last 168 hours. CBG: Recent Labs  Lab 03/16/22 0712 03/16/22 1127 03/16/22 1658 03/16/22 2035 03/17/22 0730  GLUCAP 118* 207* 123* 168* 170*    Iron Studies:  Recent Labs    03/16/22 0334  IRON 49  TIBC 279   Studies/Results: No results found.  Medications: Infusions:  ferric gluconate (FERRLECIT) IVPB 250 mg (03/17/22 0851)    Scheduled Medications:  allopurinol  100 mg Oral Daily    atorvastatin  80 mg Oral Daily   calcitRIOL  0.25 mcg Oral Once per day on Mon Wed Fri   Chlorhexidine Gluconate Cloth  6 each Topical Daily   fenofibrate  160 mg Oral Daily   heparin  5,000 Units Subcutaneous Q8H   hydrALAZINE  25 mg Oral Q8H   insulin aspart  0-6 Units Subcutaneous TID WC   metoprolol succinate  50 mg Oral Daily   pantoprazole  40 mg Oral Daily   sodium bicarbonate  650 mg Oral TID    have reviewed scheduled and prn medications.  Physical Exam: General:NAD, comfortable Heart:RRR, s1s2 nl, no rub Lungs:clear b/l, no crackle Abdomen:soft, Non-tender, non-distended Extremities:No edema Neurology: Alert, awake, nonfocal, no asterixis  Heather [name] 03/17/2022,9:38 AM  LOS: 1 day

## 2022-03-17 NOTE — TOC Transition Note (Signed)
Transition of Care [facility]) - CM/SW Discharge Note   Patient Details  Name: Heather [name] MRN: [id number] Date of Birth: 28-Nov-1950  [facility] of [facility] CM/SW Contact:  Bartholomew Crews, RN Phone Number: [phone] 03/17/2022, 3:09 PM   Clinical Narrative:     Spoke with patient and family at the bedside to discuss post acute transition. Recent change in her [facility] agency d/t needing to have SLP after last hospitalization at other hospital. Family stated that patient was not receiving her seizure medications as ordered and was also receiving pain medication, so patient was having some cognition problems at the time. Family states now patient is back to baseline. Family is agreeable to home evaluation for therapy. HH orders provided. Spoke with liaison with [facility] who has been following patient for discharge home. Family to provide transportation home. No further TOC needs identified at this time.   Final next level of care: [place] Barriers to Discharge: No Barriers Identified   Patient Goals and CMS Choice Patient states their goals for this hospitalization and ongoing recovery are:: return home with family support CMS Medicare.gov Compare Post Acute Care list provided to:: Patient Choice offered to / list presented to : Patient  Discharge Placement                       Discharge Plan and Services   Discharge Planning Services: CM Consult            DME Arranged: N/A DME Agency: NA       HH Arranged: RN, PT, Speech Therapy [place] Agency: [place] Date HH Agency Contacted: 03/17/22 Time [place]: 1509 Representative spoke with at [place]: [place] ([place]) Interventions     Readmission Risk Interventions    03/16/2022    5:01 PM 11/29/2021    2:37 PM 08/21/2021    2:48 PM  Readmission Risk Prevention Plan  Transportation Screening Complete Complete Complete  HRI or Home Care  Consult Complete Complete   Social Work Consult for [place] Planning/Counseling Complete Complete   Palliative Care Screening Not Applicable Not Applicable   Medication Review [employer] photographer) Complete Complete Complete  HRI or Home Care Consult   Complete  SW Recovery Care/Counseling Consult   Complete  [place]   Not Applicable

## 2022-03-17 NOTE — Discharge Summary (Addendum)
Physician Discharge Summary  Heather [name] HAL:[id number] DOB: 08-Jan-1951 DOA: 03/16/2022  PCP: [name], MD  Admit date: 03/16/2022 Discharge date: 03/17/2022  Admitted From: Home Discharge disposition: Home with home health PT  Recommendations at discharge:  Resume previous medicines.   Follow-up with Dr. Theador Hawthorne as an outpatient.  Brief narrative: Heather [name] is a 71 y.o. female with PMH significant for DM2, HTN, HLD, stroke, chronic diastolic CHF, CKD, Crohn's disease, chronic anemia, vitamin B-12 deficiency, anxiety/depression, GERD, irritable bowel syndrome, insomnia, osteoarthritis. Recently hospitalized at [facility] on 5/11 for new onset diplopia and speech changes, suspected off of stroke but MRI brain was negative.  Eventually her speech resolved but she continued to have waxing and waning diplopia.  During the course of hospitalization, patient became more confused and lethargic.  Work-up included negative TSH, negative COVID-19 test.  She was noted to have uptrending creatinine from a baseline of 2.8 to a peak of 3.88.  Renal ultrasound on 5/17 was unremarkable.  Foley catheter was placed for urine output monitoring.  She had a drop in hemoglobin requiring 1 unit of blood transfusion.  Hospital course was also complicated by multiple bouts of hypoglycemia for which she was temporarily placed on D10 infusion. Because of progressively worsening renal failure, patient was transferred to [facility] on 5/18 for nephrology consultation.  Initial labs done here showed sodium 134, serum bicarb 17, BUN/creatinine 43/3.63, phosphorus elevated to 5.8, magnesium low at 1.3, CRP elevated to 3.6, ESR slightly elevated to 28 WBC normal at 4.1, hemoglobin low at 6.9, platelet slightly low at 147 Urinalysis with hazy yellow urine with negative nitrite, trace leukocytes Admitted to hospitalist service.  Subjective: Patient was seen and examined this morning.  Sitting up in  chair.  Not in distress.  No new symptoms.  Nephrology follow-up appreciated.  Creatinine improved back to baseline.   Principal Problem:   Acute renal failure superimposed on stage 3b chronic kidney disease (HCC) Active Problems:   Acute metabolic encephalopathy   Uncontrolled type 2 diabetes mellitus with hypoglycemia, with long-term current use of insulin (HCC)   Macrocytic anemia   Essential hypertension   Chronic diastolic CHF (congestive heart failure) (HCC)   Mixed diabetic hyperlipidemia associated with type 2 diabetes mellitus ([place])   Decubitus ulcer of left buttock, stage 2 [facility])    Hospital course: AKI on CKD 3B -Baseline creatinine less than 3 from February. -Patient was transferred from the other facility because of creatinine elevated to 3.63.  AKI likely due to hemodynamic instability and severe anemia.  CKD thought to be due to diabetes and hypertension.  -IV fluid was given, creatinine improved.  Nephrology follow-up appreciated.  Renal ultrasound at [facility] on 5/17 was unremarkable -Per nephrology recommendation, will resume Lasix at discharge. -Follows up with Dr. Theador Hawthorne as an outpatient. Recent Labs    09/03/21 0304 09/04/21 0300 09/05/21 0257 09/06/21 0153 11/25/21 1500 11/28/21 1437 11/30/21 0546 12/01/21 0624 03/16/22 0334 03/17/22 0455  BUN 73* 70* 68* 66* 42* 56* 53* 53* 43* 39*  CREATININE 4.12* 3.85* 3.79* 3.60* 2.82* 3.87* 2.73* 2.78* 3.63* 2.93*   Acute on chronic anemia -Baseline hemoglobin seems to be close to 8.  Patient received a total of 2 units of PRBCs last 1 week.  Hemoglobin better at 9.1 today.  1 dose of IV iron given today. Recent Labs    06/20/21 0954 07/19/21 1036 08/02/21 1033 08/02/21 1039 08/27/21 1529 08/28/21 7353 09/05/21 0757 09/06/21 2992 11/28/21 1750 11/29/21  0050 11/30/21 0546 11/30/21 1707 12/01/21 0624 03/16/22 0334 03/17/22 0925  HGB 9.3*   < > 10.1*   < >  --    < >  --    < >  --    < > 7.1*  8.0* 7.8* 6.9* 9.1*  MCV 96.2  --  96.3   < >  --    < >  --    < >  --   --  103.1*  --  96.5 97.3 94.3  VITAMINB12  --   --   --   --  1,417*  --   --   --  997*  --   --   --   --  952*  --   FOLATE  --   --   --   --   --   --   --   --  8.2  --   --   --   --  14.4  --   FERRITIN  --   --   --   --   --   --  517*  --  640*  --   --   --   --   --   --   TIBC 298  --  317  --   --   --  223*  --  211*  --   --   --   --  279  --   IRON 63  --  85  --   --   --  69  --  17*  --   --   --   --  49  --   RETICCTPCT  --   --   --   --   --   --   --   --  1.9  --   --   --   --   --   --    < > = values in this interval not displayed.   Acute metabolic encephalopathy -Per report, patient's mental status and weakness progressively worsened during the hospitalization at [facility]  -Probably multifactorial including worsening AKI, sedative meds, hospital induced delirium.   -Per report, CT head and MRI imaging at the previous hospital did not show any acute findings. -Urinalysis without any evidence of infection.   -Previously patient was on gabapentin, citalopram, Valium, Effexor, Bentyl.  Those meds were held.Faythe Ghee to gradually resume.  Type 2 diabetes mellitus Recurrent hypoglycemia -A1c 5.6 from 3/23 -Per report, patient had multiple bouts of hypoglycemia at the previous hospital requiring temporary D10 infusion.  Probably due to poor oral intake.  Oral intake improved.  Glucose level stabilized. -Not on any antidiabetic medications at home. Recent Labs  Lab 03/16/22 1127 03/16/22 1658 03/16/22 2035 03/17/22 0730 03/17/22 1120  GLUCAP 207* 123* 168* 170* 158*    Chronic diastolic CHF  Essential hypertension -Blood pressure in 140s.  Remains euvolemic.  -Home BP meds include Toprol 50 mg daily, Lasix 20 mg every other day. -Continue metoprolol.  Resume Lasix.  Hyperlipidemia -Continue statin   Stage II sacral pressure ulcer -POA -Does not appear infected.  Continue local wound  care.    Goals of care   Code Status: DNR    Mobility: PT eval ordered  Skin assessment: There is a mention of sacral ulcer at presentation.    Nutritional status:  There is no height or weight on file to calculate BMI.  Wounds:  - Pressure Injury 08/26/21 Buttocks Right;Left Stage 1 -  Intact skin with non-blanchable redness of a localized area usually over a bony prominence. (Active)  Date First Assessed/Time First Assessed: 08/26/21 0050   Location: Buttocks  Location Orientation: Right;Left  Staging: Stage 1 -  Intact skin with non-blanchable redness of a localized area usually over a bony prominence.  Present on Admission: Yes    Assessments 08/26/2021 12:50 AM 09/06/2021 10:00 AM  Dressing Type Foam - Lift dressing to assess site every shift --  Dressing Clean;Dry;Intact --  Dressing Change Frequency PRN --  Site / Wound Assessment Pink;Red --  % Wound base Red or Granulating 100% --  Wound Depth (cm) -- 0 cm  Drainage Amount None --  Treatment Cleansed --     No Linked orders to display     Pressure Injury 03/16/22 Buttocks Left;Medial Stage 2 -  Partial thickness loss of dermis presenting as a shallow open injury with a red, pink wound bed without slough. pink wound bed, perimeter reddened and purple (Active)  Date First Assessed/Time First Assessed: 03/16/22 0015   Location: Buttocks  Location Orientation: Left;Medial  Staging: Stage 2 -  Partial thickness loss of dermis presenting as a shallow open injury with a red, pink wound bed without slough.  Wound ...    Assessments 03/16/2022  1:57 AM  Wound Length (cm) 2.5 cm  Wound Width (cm) 2.2 cm  Wound Depth (cm) 0 cm  Wound Surface Area (cm^2) 5.5 cm^2  Wound Volume (cm^3) 0 cm^3     No Linked orders to display    Discharge Exam:   Vitals:   03/16/22 2036 03/17/22 0545 03/17/22 0649 03/17/22 0943  BP: (!) 183/77 (!) 194/85 (!) 172/107 (!) 188/75  Pulse: 98 90 98 (!) 101  Resp: '18 19  18  ' Temp:  99 F (37.2 C) 98.3 F (36.8 C)  98.2 F (36.8 C)  TempSrc:      SpO2: 94% 96%  96%    There is no height or weight on file to calculate BMI.  General exam: Pleasant, elderly Caucasian female.  Not in physical distress Skin: No rashes, lesions or ulcers. HEENT: Atraumatic, normocephalic, no obvious bleeding Lungs: Clear to auscultation bilaterally CVS: Regular rate and rhythm, no murmur GI/Abd soft, nontender, nondistended, bowel sound present CNS: Alert, awake, oriented to place and person only Psychiatry: Sad affect Extremities: No pedal edema, no calf tenderness  Follow ups:    Follow-up Information     [name], MD Follow up.   Specialty: Internal Medicine Why: Follow up Contact information: [address] [place] [address] [phone]                 Discharge Instructions:   Discharge Instructions     Call MD for:  difficulty breathing, headache or visual disturbances   Complete by: As directed    Call MD for:  extreme fatigue   Complete by: As directed    Call MD for:  hives   Complete by: As directed    Call MD for:  persistant dizziness or light-headedness   Complete by: As directed    Call MD for:  persistant nausea and vomiting   Complete by: As directed    Call MD for:  severe uncontrolled pain   Complete by: As directed    Call MD for:  temperature >100.4   Complete by: As directed    Diet - low sodium heart healthy  Complete by: As directed    Discharge instructions   Complete by: As directed    Recommendations at discharge:   Resume previous medicines.    Follow-up with Dr. Theador Hawthorne as an outpatient.  General discharge instructions: Follow with Primary MD [name], MD in 7 days  Please request your PCP  to go over your hospital tests, procedures, radiology results at the follow up. Please get your medicines reviewed and adjusted.  Your PCP may decide to repeat certain labs or tests as needed. Do not drive, operate heavy  machinery, perform activities at heights, swimming or participation in water activities or provide baby sitting services if your were admitted for syncope or siezures until you have seen by Primary MD or a Neurologist and advised to do so again. [place] Controlled Substance Reporting System database was reviewed. Do not drive, operate heavy machinery, perform activities at heights, swim, participate in water activities or provide baby-sitting services while on medications for pain, sleep and mood until your outpatient physician has reevaluated you and advised to do so again.  You are strongly recommended to comply with the dose, frequency and duration of prescribed medications. Activity: As tolerated with Full fall precautions use walker/cane & assistance as needed Avoid using any recreational substances like cigarette, tobacco, alcohol, or non-prescribed drug. If you experience worsening of your admission symptoms, develop shortness of breath, life threatening emergency, suicidal or homicidal thoughts you must seek medical attention immediately by calling 911 or calling your MD immediately  if symptoms less severe. You must read complete instructions/literature along with all the possible adverse reactions/side effects for all the medicines you take and that have been prescribed to you. Take any new medicine only after you have completely understood and accepted all the possible adverse reactions/side effects.  Wear Seat belts while driving. You were cared for by a hospitalist during your hospital stay. If you have any questions about your discharge medications or the care you received while you were in the hospital after you are discharged, you can call the unit and ask to speak with the hospitalist or the covering physician. Once you are discharged, your primary care physician will handle any further medical issues. Please note that NO REFILLS for any discharge medications will be authorized once  you are discharged, as it is imperative that you return to your primary care physician (or establish a relationship with a primary care physician if you do not have one).   Discharge wound care:   Complete by: As directed    Increase activity slowly   Complete by: As directed        Discharge Medications:   Allergies as of 03/17/2022       Reactions   Cozaar [losartan] Nausea And Vomiting, Swelling, Rash   Quinolones Itching, Nausea And Vomiting, Nausea Only, Swelling   Swelling of face, jaw, and lips   Cymbalta [duloxetine Hcl] Itching, Swelling   Sulfa Antibiotics Nausea And Vomiting, Swelling   Headache  Swelling of feet, legs   Synalar [fluocinolone] Swelling   Cipro [ciprofloxacin Hcl] Nausea And Vomiting, Swelling   Swelling of face, jaw, lips   Diovan [valsartan] Nausea And Vomiting, Swelling   Glucophage [metformin] Other (See Comments)   GI upset   Ketalar [ketamine] Swelling   Norco [hydrocodone-acetaminophen] Itching, Swelling   Norvasc [amlodipine Besylate] Itching, Swelling   Fluid retention    Nsaids Nausea And Vomiting, Other (See Comments)   Has bleeding ulcers   Tylenol [acetaminophen] Itching, Swelling  Tape Itching, Rash   Paper tape can only be used on this patient   Zestril [lisinopril] Swelling, Rash   Oral swelling Red streaks on arms/legs        Medication List     STOP taking these medications    fenofibrate 145 MG tablet Commonly known as: Tricor   ondansetron 4 MG tablet Commonly known as: ZOFRAN   venlafaxine XR 150 MG 24 hr capsule Commonly known as: EFFEXOR-XR   ziprasidone 20 MG capsule Commonly known as: GEODON       TAKE these medications    acetaminophen 325 MG tablet Commonly known as: TYLENOL Take 2 tablets (650 mg total) by mouth every 4 (four) hours.   allopurinol 100 MG tablet Commonly known as: ZYLOPRIM Take 100 mg by mouth daily.   atorvastatin 80 MG tablet Commonly known as: LIPITOR Take 80 mg by  mouth daily.   busPIRone 10 MG tablet Commonly known as: BUSPAR Take 10 mg by mouth 3 (three) times daily as needed for anxiety.   calcitRIOL 0.25 MCG capsule Commonly known as: ROCALTROL Take 0.25 mcg by mouth 3 (three) times a week. Monday,Wed and Fri.   calcium carbonate 1250 (500 Ca) MG chewable tablet Commonly known as: OS-CAL Chew 1 tablet by mouth 2 (two) times daily.   cyanocobalamin 1000 MCG/ML injection Commonly known as: (VITAMIN B-12) Inject 1,000 mcg into the muscle every 30 (thirty) days.   diazepam 5 MG tablet Commonly known as: VALIUM Take 1 tablet (5 mg total) by mouth every 12 (twelve) hours as needed for anxiety.   dicyclomine 20 MG tablet Commonly known as: BENTYL Take 1 tablet (20 mg total) by mouth in the morning and at bedtime.   diphenhydrAMINE 2 % cream Commonly known as: BENADRYL Apply topically 3 (three) times daily as needed for itching.   esomeprazole 40 MG capsule Commonly known as: NEXIUM Take 40 mg by mouth daily before breakfast.   furosemide 40 MG tablet Commonly known as: LASIX Take 40 mg by mouth 2 (two) times daily. What changed: Another medication with the same name was removed. Continue taking this medication, and follow the directions you see here.   gabapentin 300 MG capsule Commonly known as: NEURONTIN Take 1 capsule (300 mg total) by mouth at bedtime. What changed: when to take this   hydrALAZINE 25 MG tablet Commonly known as: APRESOLINE Take 25 mg by mouth in the morning and at bedtime.   insulin lispro 100 UNIT/ML injection Commonly known as: HUMALOG Inject 0-10 Units into the skin as directed. Sliding Scale. BS 150-200=2 units, 201-250-4 units, 251-300=6 units, 301-350=8 units, 351-400-10 units; BS >400 or <60 CALL MD.   Lantus SoloStar 100 UNIT/ML Solostar Pen Generic drug: insulin glargine Inject 0-10 Units into the skin at bedtime. Sliding Scale   levETIRAcetam 500 MG tablet Commonly known as: KEPPRA Take 500  mg by mouth 2 (two) times daily.   metoprolol tartrate 100 MG tablet Commonly known as: LOPRESSOR Take 100 mg by mouth daily.   polyethylene glycol 17 g packet Commonly known as: MIRALAX / GLYCOLAX Take 17 g by mouth daily. What changed:  when to take this reasons to take this   potassium chloride SA 20 MEQ tablet Commonly known as: KLOR-CON M Take 20 mEq by mouth every other day.   sodium bicarbonate 650 MG tablet Take 650 mg by mouth 3 (three) times daily.   vitamin C with rose hips 500 MG tablet Take 500 mg by mouth every morning.  Vitamin D 50 MCG (2000 UT) Caps Take 2,000 Units by mouth every morning.               Discharge Care Instructions  (From admission, onward)           Start     Ordered   03/17/22 0000  Discharge wound care:        03/17/22 1409             The results of significant diagnostics from this hospitalization (including imaging, microbiology, ancillary and laboratory) are listed below for reference.    Procedures and Diagnostic Studies:   No results found.   Labs:   Basic Metabolic Panel: Recent Labs  Lab 03/16/22 0334 03/17/22 0455  NA 134* 137  K 4.0 4.1  CL 108 108  CO2 17* 19*  GLUCOSE 143* 164*  BUN 43* 39*  CREATININE 3.63* 2.93*  CALCIUM 8.5* 9.1  MG 1.3*  --   PHOS 5.8*  --    GFR CrCl cannot be calculated (Unknown ideal weight.). Liver Function Tests: Recent Labs  Lab 03/16/22 0334  AST 47*  ALT 33  ALKPHOS 49  BILITOT 0.5  PROT 5.6*  ALBUMIN 2.6*   No results for input(s): LIPASE, AMYLASE in the last 168 hours. Recent Labs  Lab 03/16/22 0334  AMMONIA 24   Coagulation profile No results for input(s): INR, PROTIME in the last 168 hours.  CBC: Recent Labs  Lab 03/16/22 0334 03/17/22 0925  WBC 4.1 5.7  NEUTROABS 2.7 4.5  HGB 6.9* 9.1*  HCT 21.4* 28.3*  MCV 97.3 94.3  PLT 147* 198   Cardiac Enzymes: No results for input(s): CKTOTAL, CKMB, CKMBINDEX, TROPONINI in the last 168  hours. BNP: Invalid input(s): POCBNP CBG: Recent Labs  Lab 03/16/22 1127 03/16/22 1658 03/16/22 2035 03/17/22 0730 03/17/22 1120  GLUCAP 207* 123* 168* 170* 158*   D-Dimer No results for input(s): DDIMER in the last 72 hours. Hgb A1c No results for input(s): HGBA1C in the last 72 hours. Lipid Profile No results for input(s): CHOL, HDL, LDLCALC, TRIG, CHOLHDL, LDLDIRECT in the last 72 hours. Thyroid function studies No results for input(s): TSH, T4TOTAL, T3FREE, THYROIDAB in the last 72 hours.  Invalid input(s): FREET3 Anemia work up Recent Labs    03/16/22 0334  VITAMINB12 952*  FOLATE 14.4  TIBC 279  IRON 49   Microbiology No results found for this or any previous visit (from the past 240 hour(s)).  Time coordinating discharge: 35 minutes  Signed: Amoni Scallan  [facility] 03/17/2022, 2:09 PM

## 2022-03-17 NOTE — Care Management CC44 (Signed)
Condition Code 44 Documentation Completed  Patient Details  Name: Heather [name] MRN: [id number] Date of Birth: 01/09/1951   Condition Code 44 given:  Yes Patient signature on Condition Code 44 notice:  Yes Documentation of 2 MD's agreement:  Yes Code 44 added to claim:  Yes    Bartholomew Crews, RN 03/17/2022, 2:43 PM

## 2022-03-17 NOTE — Care Management Obs Status (Signed)
[place] NOTIFICATION   Patient Details  Name: Heather [name] MRN: [id number] Date of Birth: 07-Jun-1951   Medicare Observation Status Notification Given:  Yes    Bartholomew Crews, RN 03/17/2022, 2:43 PM

## 2022-03-19 LAB — GLUCOSE, CAPILLARY: Glucose-Capillary: 181 mg/dL — ABNORMAL HIGH (ref 70–99)

## 2022-03-20 DIAGNOSIS — I10 Essential (primary) hypertension: Secondary | ICD-10-CM | POA: Diagnosis not present

## 2022-03-20 DIAGNOSIS — Z299 Encounter for prophylactic measures, unspecified: Secondary | ICD-10-CM | POA: Diagnosis not present

## 2022-03-20 DIAGNOSIS — R110 Nausea: Secondary | ICD-10-CM | POA: Diagnosis not present

## 2022-03-20 DIAGNOSIS — E1165 Type 2 diabetes mellitus with hyperglycemia: Secondary | ICD-10-CM | POA: Diagnosis not present

## 2022-03-20 DIAGNOSIS — N184 Chronic kidney disease, stage 4 (severe): Secondary | ICD-10-CM | POA: Diagnosis not present

## 2022-03-22 ENCOUNTER — Ambulatory Visit: Payer: Medicare HMO | Admitting: Physician Assistant

## 2022-03-28 ENCOUNTER — Encounter: Payer: Self-pay | Admitting: Orthopedic Surgery

## 2022-03-28 ENCOUNTER — Ambulatory Visit (INDEPENDENT_AMBULATORY_CARE_PROVIDER_SITE_OTHER): Payer: Medicare HMO | Admitting: Orthopedic Surgery

## 2022-03-28 ENCOUNTER — Ambulatory Visit (INDEPENDENT_AMBULATORY_CARE_PROVIDER_SITE_OTHER): Payer: Medicare HMO

## 2022-03-28 VITALS — Ht 62.0 in | Wt 135.0 lb

## 2022-03-28 DIAGNOSIS — S4291XD Fracture of right shoulder girdle, part unspecified, subsequent encounter for fracture with routine healing: Secondary | ICD-10-CM

## 2022-03-28 DIAGNOSIS — E1122 Type 2 diabetes mellitus with diabetic chronic kidney disease: Secondary | ICD-10-CM | POA: Diagnosis not present

## 2022-03-28 DIAGNOSIS — R808 Other proteinuria: Secondary | ICD-10-CM | POA: Diagnosis not present

## 2022-03-28 DIAGNOSIS — I129 Hypertensive chronic kidney disease with stage 1 through stage 4 chronic kidney disease, or unspecified chronic kidney disease: Secondary | ICD-10-CM | POA: Diagnosis not present

## 2022-03-28 DIAGNOSIS — E871 Hypo-osmolality and hyponatremia: Secondary | ICD-10-CM | POA: Diagnosis not present

## 2022-03-28 DIAGNOSIS — S4291XG Fracture of right shoulder girdle, part unspecified, subsequent encounter for fracture with delayed healing: Secondary | ICD-10-CM | POA: Diagnosis not present

## 2022-03-28 DIAGNOSIS — N189 Chronic kidney disease, unspecified: Secondary | ICD-10-CM | POA: Diagnosis not present

## 2022-03-28 DIAGNOSIS — E1165 Type 2 diabetes mellitus with hyperglycemia: Secondary | ICD-10-CM | POA: Diagnosis not present

## 2022-03-28 DIAGNOSIS — I5032 Chronic diastolic (congestive) heart failure: Secondary | ICD-10-CM | POA: Diagnosis not present

## 2022-03-28 MED ORDER — TRAMADOL HCL 50 MG PO TABS
50.0000 mg | ORAL_TABLET | Freq: Four times a day (QID) | ORAL | 0 refills | Status: AC | PRN
Start: 2022-03-28 — End: 2022-04-04

## 2022-03-29 ENCOUNTER — Encounter: Payer: Self-pay | Admitting: Orthopedic Surgery

## 2022-03-29 DIAGNOSIS — I6381 Other cerebral infarction due to occlusion or stenosis of small artery: Secondary | ICD-10-CM | POA: Diagnosis not present

## 2022-03-29 NOTE — Progress Notes (Signed)
Return patient Visit  Assessment: Heather [name] is a 71 y.o. female with the following: 1. Closed fracture of right shoulder; displaced proximal humerus fracture  Plan: Mrs. Wiesen sustained a right proximal humerus fracture, with displacement, after a fall in January, 2023.  I have not seen her in clinic for close to 3 months.  Unfortunately, the fracture has displaced further.  She continues to have pain.  She is not a good surgical candidate.  She has no interest in surgery.  Options were discussed in clinic today.  She endorses severe pain in the shoulder, and I am recommending pain management as a result.  I provided her with a limited supply of tramadol.  She can use her arm as much as tolerated.  If she has further issues, I will see her in clinic.  Otherwise, follow-up as needed.    Follow-up: Return if symptoms worsen or fail to improve.  Subjective:  Chief Complaint  Patient presents with   Fracture    Rt shoulder DOI 11/24/21    History of Present Illness: Heather [name] is a 71 y.o. female who returns for evaluation of right shoulder pain.  She sustained a right proximal humerus fracture almost 4 months ago.  She had delayed presentation to clinic, and I have not seen her for almost 3 months.  She reports that she has been hospitalized at least 2 separate times due to medical issues.  At the last visit, I recommended physical therapy.  She did not continue to work with physical therapist.  She did have someone come to the house.  She continues to have severe pain in the right shoulder.   Review of Systems: No fevers or chills No numbness or tingling No chest pain No shortness of breath No bowel or bladder dysfunction No GI distress No headaches   Objective: Ht '5\' 2"'$  (1.575 m)   Wt 135 lb (61.2 kg)   LMP  (LMP Unknown)   BMI 24.69 kg/m   Physical Exam:  General: Alert and oriented. and No acute distress. Gait: Normal gait.  Evaluation of the  right shoulder demonstrates no deformity.  No bruising is appreciated.  She has some swelling of the posterior aspect of the upper arm.  This is not tender to palpation.  No overlying skin changes.  60 degrees of forward elevation.  60 degrees of abduction at her side.  Fingers are warm and well-perfused.  Sensation is intact in the axillary nerve distribution.  Sensation is intact throughout the right hand.  IMAGING: I personally ordered and reviewed the following images  X-ray of the right shoulder was obtained in clinic today.  These were compared to prior x-rays.  There has been interval displacement of the fracture.  There has been worsening of the impaction of the shaft, displaced the articular margin posteriorly.  The shaft is now projecting superior and anterior to the articular margin.  There is some callus formation around the fracture.  The glenohumeral joint appears to be reduced.  No acute injuries are noted.  Impression: Interval worsening of right proximal humerus fracture with anterior displacement of the humeral shaft.   New Medications:  Meds ordered this encounter  Medications   traMADol (ULTRAM) 50 MG tablet    Sig: Take 1 tablet (50 mg total) by mouth every 6 (six) hours as needed for up to 7 days.    Dispense:  28 tablet    Refill:  0      Chrishawn Boley A  Amedeo Kinsman, MD  03/29/2022 11:14 AM

## 2022-04-04 DIAGNOSIS — N179 Acute kidney failure, unspecified: Secondary | ICD-10-CM | POA: Diagnosis not present

## 2022-04-06 ENCOUNTER — Ambulatory Visit: Payer: Medicare HMO | Admitting: Physician Assistant

## 2022-04-09 DIAGNOSIS — M25511 Pain in right shoulder: Secondary | ICD-10-CM | POA: Diagnosis not present

## 2022-04-09 DIAGNOSIS — S52531A Colles' fracture of right radius, initial encounter for closed fracture: Secondary | ICD-10-CM | POA: Diagnosis not present

## 2022-04-10 ENCOUNTER — Encounter (HOSPITAL_COMMUNITY)
Admission: RE | Admit: 2022-04-10 | Discharge: 2022-04-10 | Disposition: A | Discharge status: Home / Self Care | Payer: Medicare HMO | Source: Ambulatory Visit | Attending: Nephrology | Admitting: Nephrology

## 2022-04-10 VITALS — BP 162/77 | Temp 97.9°F | Resp 16

## 2022-04-10 DIAGNOSIS — D631 Anemia in chronic kidney disease: Secondary | ICD-10-CM | POA: Insufficient documentation

## 2022-04-10 DIAGNOSIS — N184 Chronic kidney disease, stage 4 (severe): Secondary | ICD-10-CM | POA: Diagnosis not present

## 2022-04-10 DIAGNOSIS — N179 Acute kidney failure, unspecified: Secondary | ICD-10-CM

## 2022-04-10 LAB — POCT HEMOGLOBIN-HEMACUE: Hemoglobin: 10.9 g/dL — ABNORMAL LOW (ref 12.0–15.0)

## 2022-04-11 DIAGNOSIS — I5032 Chronic diastolic (congestive) heart failure: Secondary | ICD-10-CM | POA: Diagnosis not present

## 2022-04-11 DIAGNOSIS — R808 Other proteinuria: Secondary | ICD-10-CM | POA: Diagnosis not present

## 2022-04-11 DIAGNOSIS — N189 Chronic kidney disease, unspecified: Secondary | ICD-10-CM | POA: Diagnosis not present

## 2022-04-11 DIAGNOSIS — I129 Hypertensive chronic kidney disease with stage 1 through stage 4 chronic kidney disease, or unspecified chronic kidney disease: Secondary | ICD-10-CM | POA: Diagnosis not present

## 2022-04-11 DIAGNOSIS — E1122 Type 2 diabetes mellitus with diabetic chronic kidney disease: Secondary | ICD-10-CM | POA: Diagnosis not present

## 2022-04-11 DIAGNOSIS — E876 Hypokalemia: Secondary | ICD-10-CM | POA: Diagnosis not present

## 2022-04-15 ENCOUNTER — Other Ambulatory Visit: Payer: Self-pay | Admitting: Nurse Practitioner

## 2022-04-16 ENCOUNTER — Other Ambulatory Visit: Payer: Self-pay | Admitting: Cardiology

## 2022-04-16 ENCOUNTER — Other Ambulatory Visit: Payer: Self-pay

## 2022-04-24 ENCOUNTER — Ambulatory Visit (HOSPITAL_COMMUNITY): Payer: Medicare HMO

## 2022-04-24 ENCOUNTER — Other Ambulatory Visit: Payer: Self-pay | Admitting: Cardiology

## 2022-04-25 ENCOUNTER — Other Ambulatory Visit: Payer: Self-pay | Admitting: Nurse Practitioner

## 2022-04-26 DIAGNOSIS — R300 Dysuria: Secondary | ICD-10-CM | POA: Diagnosis not present

## 2022-04-26 DIAGNOSIS — G8929 Other chronic pain: Secondary | ICD-10-CM | POA: Diagnosis not present

## 2022-04-26 DIAGNOSIS — E538 Deficiency of other specified B group vitamins: Secondary | ICD-10-CM | POA: Diagnosis not present

## 2022-04-26 DIAGNOSIS — E1140 Type 2 diabetes mellitus with diabetic neuropathy, unspecified: Secondary | ICD-10-CM | POA: Diagnosis not present

## 2022-04-26 DIAGNOSIS — M25511 Pain in right shoulder: Secondary | ICD-10-CM | POA: Diagnosis not present

## 2022-04-26 DIAGNOSIS — Z79899 Other long term (current) drug therapy: Secondary | ICD-10-CM | POA: Diagnosis not present

## 2022-04-26 DIAGNOSIS — M549 Dorsalgia, unspecified: Secondary | ICD-10-CM | POA: Diagnosis not present

## 2022-04-26 DIAGNOSIS — Z0130 Encounter for examination of blood pressure without abnormal findings: Secondary | ICD-10-CM | POA: Diagnosis not present

## 2022-04-26 DIAGNOSIS — E119 Type 2 diabetes mellitus without complications: Secondary | ICD-10-CM | POA: Diagnosis not present

## 2022-04-30 ENCOUNTER — Other Ambulatory Visit: Payer: Self-pay | Admitting: Cardiology

## 2022-05-02 DIAGNOSIS — R5383 Other fatigue: Secondary | ICD-10-CM | POA: Diagnosis not present

## 2022-05-02 DIAGNOSIS — G8929 Other chronic pain: Secondary | ICD-10-CM | POA: Diagnosis not present

## 2022-05-02 DIAGNOSIS — E559 Vitamin D deficiency, unspecified: Secondary | ICD-10-CM | POA: Diagnosis not present

## 2022-05-02 DIAGNOSIS — Z794 Long term (current) use of insulin: Secondary | ICD-10-CM | POA: Diagnosis not present

## 2022-05-02 DIAGNOSIS — Z0130 Encounter for examination of blood pressure without abnormal findings: Secondary | ICD-10-CM | POA: Diagnosis not present

## 2022-05-02 DIAGNOSIS — E119 Type 2 diabetes mellitus without complications: Secondary | ICD-10-CM | POA: Diagnosis not present

## 2022-05-02 DIAGNOSIS — Z79899 Other long term (current) drug therapy: Secondary | ICD-10-CM | POA: Diagnosis not present

## 2022-05-02 DIAGNOSIS — M25511 Pain in right shoulder: Secondary | ICD-10-CM | POA: Diagnosis not present

## 2022-05-02 DIAGNOSIS — M549 Dorsalgia, unspecified: Secondary | ICD-10-CM | POA: Diagnosis not present

## 2022-05-02 DIAGNOSIS — N184 Chronic kidney disease, stage 4 (severe): Secondary | ICD-10-CM | POA: Diagnosis not present

## 2022-05-03 ENCOUNTER — Encounter (HOSPITAL_COMMUNITY)
Admission: RE | Admit: 2022-05-03 | Discharge: 2022-05-03 | Disposition: A | Discharge status: Home / Self Care | Payer: Medicare HMO | Source: Ambulatory Visit | Attending: Nephrology | Admitting: Nephrology

## 2022-05-03 DIAGNOSIS — N179 Acute kidney failure, unspecified: Secondary | ICD-10-CM | POA: Insufficient documentation

## 2022-05-03 DIAGNOSIS — N184 Chronic kidney disease, stage 4 (severe): Secondary | ICD-10-CM | POA: Insufficient documentation

## 2022-05-03 DIAGNOSIS — D631 Anemia in chronic kidney disease: Secondary | ICD-10-CM | POA: Insufficient documentation

## 2022-05-03 LAB — POCT HEMOGLOBIN-HEMACUE: Hemoglobin: 9.4 g/dL — ABNORMAL LOW (ref 12.0–15.0)

## 2022-05-03 MED ORDER — EPOETIN ALFA-EPBX 3000 UNIT/ML IJ SOLN
3000.0000 [IU] | Freq: Once | INTRAMUSCULAR | Status: AC
  Administered 2022-05-03: 3000 [IU] via SUBCUTANEOUS

## 2022-05-03 MED ORDER — EPOETIN ALFA-EPBX 3000 UNIT/ML IJ SOLN
INTRAMUSCULAR | Status: AC
  Filled 2022-05-03: qty 1

## 2022-05-07 ENCOUNTER — Telehealth: Payer: Self-pay | Admitting: Cardiology

## 2022-05-07 DIAGNOSIS — Z79899 Other long term (current) drug therapy: Secondary | ICD-10-CM | POA: Diagnosis not present

## 2022-05-07 DIAGNOSIS — M25842 Other specified joint disorders, left hand: Secondary | ICD-10-CM | POA: Diagnosis not present

## 2022-05-07 NOTE — Telephone Encounter (Signed)
Pt called to make office aware that [facility] should be sending over a Medical Clearance. Please advise

## 2022-05-09 DIAGNOSIS — M19011 Primary osteoarthritis, right shoulder: Secondary | ICD-10-CM | POA: Diagnosis not present

## 2022-05-09 DIAGNOSIS — M25511 Pain in right shoulder: Secondary | ICD-10-CM | POA: Diagnosis not present

## 2022-05-11 DIAGNOSIS — F1120 Opioid dependence, uncomplicated: Secondary | ICD-10-CM | POA: Diagnosis not present

## 2022-05-11 DIAGNOSIS — E119 Type 2 diabetes mellitus without complications: Secondary | ICD-10-CM | POA: Diagnosis not present

## 2022-05-11 DIAGNOSIS — Z0130 Encounter for examination of blood pressure without abnormal findings: Secondary | ICD-10-CM | POA: Diagnosis not present

## 2022-05-11 DIAGNOSIS — E7800 Pure hypercholesterolemia, unspecified: Secondary | ICD-10-CM | POA: Diagnosis not present

## 2022-05-11 DIAGNOSIS — Z794 Long term (current) use of insulin: Secondary | ICD-10-CM | POA: Diagnosis not present

## 2022-05-11 DIAGNOSIS — E559 Vitamin D deficiency, unspecified: Secondary | ICD-10-CM | POA: Diagnosis not present

## 2022-05-11 DIAGNOSIS — D329 Benign neoplasm of meninges, unspecified: Secondary | ICD-10-CM | POA: Diagnosis not present

## 2022-05-11 DIAGNOSIS — Z79899 Other long term (current) drug therapy: Secondary | ICD-10-CM | POA: Diagnosis not present

## 2022-05-11 DIAGNOSIS — Z6823 Body mass index (BMI) 23.0-23.9, adult: Secondary | ICD-10-CM | POA: Diagnosis not present

## 2022-05-17 ENCOUNTER — Encounter (HOSPITAL_COMMUNITY)
Admission: RE | Admit: 2022-05-17 | Discharge: 2022-05-17 | Disposition: A | Discharge status: Home / Self Care | Payer: Medicare HMO | Source: Ambulatory Visit | Attending: Nephrology | Admitting: Nephrology

## 2022-05-17 ENCOUNTER — Encounter (HOSPITAL_COMMUNITY): Payer: Self-pay

## 2022-05-17 VITALS — Ht 62.0 in | Wt 134.5 lb

## 2022-05-17 DIAGNOSIS — N189 Chronic kidney disease, unspecified: Secondary | ICD-10-CM | POA: Diagnosis not present

## 2022-05-17 DIAGNOSIS — E1122 Type 2 diabetes mellitus with diabetic chronic kidney disease: Secondary | ICD-10-CM | POA: Diagnosis not present

## 2022-05-17 DIAGNOSIS — R808 Other proteinuria: Secondary | ICD-10-CM | POA: Diagnosis not present

## 2022-05-17 DIAGNOSIS — E875 Hyperkalemia: Secondary | ICD-10-CM | POA: Diagnosis not present

## 2022-05-17 DIAGNOSIS — I129 Hypertensive chronic kidney disease with stage 1 through stage 4 chronic kidney disease, or unspecified chronic kidney disease: Secondary | ICD-10-CM | POA: Diagnosis not present

## 2022-05-17 DIAGNOSIS — N179 Acute kidney failure, unspecified: Secondary | ICD-10-CM | POA: Diagnosis not present

## 2022-05-17 DIAGNOSIS — N170 Acute kidney failure with tubular necrosis: Secondary | ICD-10-CM | POA: Diagnosis not present

## 2022-05-17 DIAGNOSIS — N184 Chronic kidney disease, stage 4 (severe): Secondary | ICD-10-CM | POA: Diagnosis not present

## 2022-05-17 DIAGNOSIS — E876 Hypokalemia: Secondary | ICD-10-CM | POA: Diagnosis not present

## 2022-05-17 DIAGNOSIS — D638 Anemia in other chronic diseases classified elsewhere: Secondary | ICD-10-CM | POA: Diagnosis not present

## 2022-05-17 DIAGNOSIS — I5032 Chronic diastolic (congestive) heart failure: Secondary | ICD-10-CM | POA: Diagnosis not present

## 2022-05-17 DIAGNOSIS — D631 Anemia in chronic kidney disease: Secondary | ICD-10-CM | POA: Diagnosis not present

## 2022-05-17 DIAGNOSIS — E8722 Chronic metabolic acidosis: Secondary | ICD-10-CM | POA: Diagnosis not present

## 2022-05-17 DIAGNOSIS — E871 Hypo-osmolality and hyponatremia: Secondary | ICD-10-CM | POA: Diagnosis not present

## 2022-05-17 LAB — FERRITIN: Ferritin: 1852 ng/mL — ABNORMAL HIGH (ref 11–307)

## 2022-05-17 LAB — CBC WITH DIFFERENTIAL/PLATELET
Abs Immature Granulocytes: 0.03 10*3/uL (ref 0.00–0.07)
Basophils Absolute: 0.1 10*3/uL (ref 0.0–0.1)
Basophils Relative: 1 %
Eosinophils Absolute: 0.2 10*3/uL (ref 0.0–0.5)
Eosinophils Relative: 2 %
HCT: 33.8 % — ABNORMAL LOW (ref 36.0–46.0)
Hemoglobin: 11.3 g/dL — ABNORMAL LOW (ref 12.0–15.0)
Immature Granulocytes: 0 %
Lymphocytes Relative: 15 %
Lymphs Abs: 1.1 10*3/uL (ref 0.7–4.0)
MCH: 31.5 pg (ref 26.0–34.0)
MCHC: 33.4 g/dL (ref 30.0–36.0)
MCV: 94.2 fL (ref 80.0–100.0)
Monocytes Absolute: 0.3 10*3/uL (ref 0.1–1.0)
Monocytes Relative: 5 %
Neutro Abs: 5.5 10*3/uL (ref 1.7–7.7)
Neutrophils Relative %: 77 %
Platelets: 331 10*3/uL (ref 150–400)
RBC: 3.59 MIL/uL — ABNORMAL LOW (ref 3.87–5.11)
RDW: 14.2 % (ref 11.5–15.5)
WBC: 7.2 10*3/uL (ref 4.0–10.5)
nRBC: 0 % (ref 0.0–0.2)

## 2022-05-17 LAB — RENAL FUNCTION PANEL
Albumin: 3.1 g/dL — ABNORMAL LOW (ref 3.5–5.0)
Anion gap: 10 (ref 5–15)
BUN: 50 mg/dL — ABNORMAL HIGH (ref 8–23)
CO2: 14 mmol/L — ABNORMAL LOW (ref 22–32)
Calcium: 9.5 mg/dL (ref 8.9–10.3)
Chloride: 108 mmol/L (ref 98–111)
Creatinine, Ser: 2.53 mg/dL — ABNORMAL HIGH (ref 0.44–1.00)
GFR, Estimated: 20 mL/min — ABNORMAL LOW (ref >60)
Glucose, Bld: 395 mg/dL — ABNORMAL HIGH (ref 70–99)
Phosphorus: 4.8 mg/dL — ABNORMAL HIGH (ref 2.5–4.6)
Potassium: 5.2 mmol/L — ABNORMAL HIGH (ref 3.5–5.1)
Sodium: 132 mmol/L — ABNORMAL LOW (ref 135–145)

## 2022-05-17 LAB — IRON AND TIBC
Iron: 161 ug/dL (ref 28–170)
Saturation Ratios: 61 % — ABNORMAL HIGH (ref 10.4–31.8)
TIBC: 265 ug/dL (ref 250–450)
UIBC: 104 ug/dL

## 2022-05-17 LAB — POCT HEMOGLOBIN-HEMACUE: Hemoglobin: 11.7 g/dL — ABNORMAL LOW (ref 12.0–15.0)

## 2022-05-17 LAB — PROTEIN / CREATININE RATIO, URINE
Creatinine, Urine: 66.98 mg/dL
Protein Creatinine Ratio: 5.08 mg/mg{Cre} — ABNORMAL HIGH (ref 0.00–0.15)
Total Protein, Urine: 340 mg/dL

## 2022-05-17 MED ORDER — EPOETIN ALFA-EPBX 3000 UNIT/ML IJ SOLN: 3000.0000 [IU] | Freq: Once | INTRAMUSCULAR | Status: DC

## 2022-05-18 ENCOUNTER — Encounter (HOSPITAL_COMMUNITY): Payer: Self-pay | Admitting: Anesthesiology

## 2022-05-18 ENCOUNTER — Encounter (HOSPITAL_BASED_OUTPATIENT_CLINIC_OR_DEPARTMENT_OTHER): Payer: Self-pay | Admitting: Orthopedic Surgery

## 2022-05-18 LAB — PTH, INTACT AND CALCIUM
Calcium, Total (PTH): 9.9 mg/dL (ref 8.7–10.3)
PTH: 91 pg/mL — ABNORMAL HIGH (ref 15–65)

## 2022-05-18 NOTE — Progress Notes (Signed)
Reviewed chart with PAT. Recent endocrine visit shows Cr 2.5 which is actually better than previously, K of 5.2, glucose of 395, as well as a1c 11.7. Recommend endocrine visit for titration of insulin regimen prior to elective surgery. Will need repeat BMP for potassium and creatinine level.

## 2022-05-18 NOTE — Progress Notes (Signed)
Spoke with dr Elgie Congo mda and reviewed patient history and blood glucose was 395 on [phone], pt surgery for 05-22-2022 to be rescheduled and pt needs endocrinology clearance, nephrology clearance per dr Elgie Congo mda. Left voice mail message with Caryl Pina at dr spears office, pt needs nephrology clearance, endocrinology clearance per dr Elgie Congo mda

## 2022-05-21 ENCOUNTER — Telehealth: Payer: Self-pay

## 2022-05-21 ENCOUNTER — Telehealth: Payer: Self-pay | Admitting: Cardiology

## 2022-05-21 NOTE — Telephone Encounter (Signed)
I left a message for the pt that we still have not received the clearance request. Please have surgeon office fax clearance to fax # [phone] attn: pre op team .

## 2022-05-21 NOTE — Telephone Encounter (Signed)
Pt calling to f/u on Clearance. Please advise 

## 2022-05-21 NOTE — Telephone Encounter (Signed)
Left a message requesting pt return call to the office. ?

## 2022-05-21 NOTE — Telephone Encounter (Signed)
Patient left a VM stating she is suppose to have surgery tomorrow but she said we did not respond on medical clearance. Can you call pt

## 2022-05-22 ENCOUNTER — Ambulatory Visit (HOSPITAL_BASED_OUTPATIENT_CLINIC_OR_DEPARTMENT_OTHER): Admission: RE | Admit: 2022-05-22 | Payer: Medicare HMO | Source: Home / Self Care | Admitting: Orthopedic Surgery

## 2022-05-22 SURGERY — EXCISION MASS
Anesthesia: Monitor Anesthesia Care | Laterality: Left

## 2022-05-22 NOTE — Telephone Encounter (Signed)
Left a message requesting pt return call to the office. ?

## 2022-05-22 NOTE — Telephone Encounter (Signed)
Pt is asking for a return call

## 2022-05-24 NOTE — Telephone Encounter (Signed)
Pt is asking for a call back

## 2022-05-24 NOTE — Telephone Encounter (Signed)
Spoke with pt, advised her she would need to be seen for surgical clearance. Pt voiced understanding and has an upcoming appointment on August 8th.

## 2022-05-26 ENCOUNTER — Other Ambulatory Visit: Payer: Self-pay

## 2022-05-26 ENCOUNTER — Emergency Department (HOSPITAL_COMMUNITY): Payer: Medicare HMO

## 2022-05-26 ENCOUNTER — Observation Stay (HOSPITAL_COMMUNITY)
Admission: EM | Admit: 2022-05-26 | Discharge: 2022-05-27 | Disposition: A | Discharge status: Home / Self Care | Payer: Medicare HMO | Attending: Family Medicine | Admitting: Family Medicine

## 2022-05-26 ENCOUNTER — Encounter (HOSPITAL_COMMUNITY): Payer: Self-pay

## 2022-05-26 ENCOUNTER — Other Ambulatory Visit (HOSPITAL_COMMUNITY)
Admission: RE | Admit: 2022-05-26 | Discharge: 2022-05-26 | Disposition: A | Discharge status: Home / Self Care | Payer: Medicare HMO | Source: Other Acute Inpatient Hospital | Attending: Nephrology | Admitting: Nephrology

## 2022-05-26 DIAGNOSIS — N170 Acute kidney failure with tubular necrosis: Secondary | ICD-10-CM | POA: Insufficient documentation

## 2022-05-26 DIAGNOSIS — Z794 Long term (current) use of insulin: Secondary | ICD-10-CM | POA: Diagnosis not present

## 2022-05-26 DIAGNOSIS — N184 Chronic kidney disease, stage 4 (severe): Secondary | ICD-10-CM | POA: Diagnosis present

## 2022-05-26 DIAGNOSIS — I130 Hypertensive heart and chronic kidney disease with heart failure and stage 1 through stage 4 chronic kidney disease, or unspecified chronic kidney disease: Secondary | ICD-10-CM | POA: Diagnosis not present

## 2022-05-26 DIAGNOSIS — Z79899 Other long term (current) drug therapy: Secondary | ICD-10-CM | POA: Diagnosis not present

## 2022-05-26 DIAGNOSIS — N1832 Chronic kidney disease, stage 3b: Secondary | ICD-10-CM | POA: Insufficient documentation

## 2022-05-26 DIAGNOSIS — E1140 Type 2 diabetes mellitus with diabetic neuropathy, unspecified: Secondary | ICD-10-CM | POA: Diagnosis not present

## 2022-05-26 DIAGNOSIS — E8722 Chronic metabolic acidosis: Secondary | ICD-10-CM | POA: Insufficient documentation

## 2022-05-26 DIAGNOSIS — E1165 Type 2 diabetes mellitus with hyperglycemia: Secondary | ICD-10-CM | POA: Insufficient documentation

## 2022-05-26 DIAGNOSIS — Z86011 Personal history of benign neoplasm of the brain: Secondary | ICD-10-CM | POA: Diagnosis not present

## 2022-05-26 DIAGNOSIS — N179 Acute kidney failure, unspecified: Secondary | ICD-10-CM | POA: Diagnosis present

## 2022-05-26 DIAGNOSIS — E1100 Type 2 diabetes mellitus with hyperosmolarity without nonketotic hyperglycemic-hyperosmolar coma (NKHHC): Principal | ICD-10-CM | POA: Diagnosis present

## 2022-05-26 DIAGNOSIS — E875 Hyperkalemia: Secondary | ICD-10-CM | POA: Insufficient documentation

## 2022-05-26 DIAGNOSIS — R112 Nausea with vomiting, unspecified: Secondary | ICD-10-CM | POA: Diagnosis not present

## 2022-05-26 DIAGNOSIS — R1012 Left upper quadrant pain: Secondary | ICD-10-CM | POA: Diagnosis present

## 2022-05-26 DIAGNOSIS — Z96641 Presence of right artificial hip joint: Secondary | ICD-10-CM | POA: Diagnosis not present

## 2022-05-26 DIAGNOSIS — K859 Acute pancreatitis without necrosis or infection, unspecified: Secondary | ICD-10-CM | POA: Insufficient documentation

## 2022-05-26 DIAGNOSIS — I10 Essential (primary) hypertension: Secondary | ICD-10-CM | POA: Diagnosis present

## 2022-05-26 DIAGNOSIS — I2510 Atherosclerotic heart disease of native coronary artery without angina pectoris: Secondary | ICD-10-CM | POA: Diagnosis not present

## 2022-05-26 DIAGNOSIS — R808 Other proteinuria: Secondary | ICD-10-CM | POA: Insufficient documentation

## 2022-05-26 DIAGNOSIS — D631 Anemia in chronic kidney disease: Secondary | ICD-10-CM | POA: Diagnosis not present

## 2022-05-26 DIAGNOSIS — N189 Chronic kidney disease, unspecified: Secondary | ICD-10-CM | POA: Insufficient documentation

## 2022-05-26 DIAGNOSIS — I5032 Chronic diastolic (congestive) heart failure: Secondary | ICD-10-CM | POA: Insufficient documentation

## 2022-05-26 DIAGNOSIS — R739 Hyperglycemia, unspecified: Secondary | ICD-10-CM

## 2022-05-26 DIAGNOSIS — E1122 Type 2 diabetes mellitus with diabetic chronic kidney disease: Secondary | ICD-10-CM | POA: Insufficient documentation

## 2022-05-26 DIAGNOSIS — Z8673 Personal history of transient ischemic attack (TIA), and cerebral infarction without residual deficits: Secondary | ICD-10-CM | POA: Insufficient documentation

## 2022-05-26 DIAGNOSIS — I129 Hypertensive chronic kidney disease with stage 1 through stage 4 chronic kidney disease, or unspecified chronic kidney disease: Secondary | ICD-10-CM | POA: Insufficient documentation

## 2022-05-26 DIAGNOSIS — N200 Calculus of kidney: Secondary | ICD-10-CM | POA: Diagnosis not present

## 2022-05-26 DIAGNOSIS — N3289 Other specified disorders of bladder: Secondary | ICD-10-CM | POA: Diagnosis not present

## 2022-05-26 DIAGNOSIS — E1169 Type 2 diabetes mellitus with other specified complication: Secondary | ICD-10-CM | POA: Diagnosis present

## 2022-05-26 LAB — BLOOD GAS, VENOUS
Acid-base deficit: 9.3 mmol/L — ABNORMAL HIGH (ref 0.0–2.0)
Bicarbonate: 18.8 mmol/L — ABNORMAL LOW (ref 20.0–28.0)
Drawn by: 7012
FIO2: 21 %
O2 Saturation: 24.3 %
Patient temperature: 36.4
pCO2, Ven: 47 mmHg (ref 44–60)
pH, Ven: 7.21 — ABNORMAL LOW (ref 7.25–7.43)
pO2, Ven: <31 mmHg — CL (ref 32–45)

## 2022-05-26 LAB — COMPREHENSIVE METABOLIC PANEL
ALT: 21 U/L (ref 0–44)
AST: 26 U/L (ref 15–41)
Albumin: 3.1 g/dL — ABNORMAL LOW (ref 3.5–5.0)
Alkaline Phosphatase: 111 U/L (ref 38–126)
Anion gap: 13 (ref 5–15)
BUN: 69 mg/dL — ABNORMAL HIGH (ref 8–23)
CO2: 18 mmol/L — ABNORMAL LOW (ref 22–32)
Calcium: 9.3 mg/dL (ref 8.9–10.3)
Chloride: 94 mmol/L — ABNORMAL LOW (ref 98–111)
Creatinine, Ser: 3.47 mg/dL — ABNORMAL HIGH (ref 0.44–1.00)
GFR, Estimated: 14 mL/min — ABNORMAL LOW (ref >60)
Glucose, Bld: 580 mg/dL (ref 70–99)
Potassium: 4.6 mmol/L (ref 3.5–5.1)
Sodium: 125 mmol/L — ABNORMAL LOW (ref 135–145)
Total Bilirubin: 0.7 mg/dL (ref 0.3–1.2)
Total Protein: 7.4 g/dL (ref 6.5–8.1)

## 2022-05-26 LAB — CBC WITH DIFFERENTIAL/PLATELET
Abs Immature Granulocytes: 0.4 10*3/uL — ABNORMAL HIGH (ref 0.00–0.07)
Basophils Absolute: 0 10*3/uL (ref 0.0–0.1)
Basophils Relative: 0 %
Eosinophils Absolute: 0 10*3/uL (ref 0.0–0.5)
Eosinophils Relative: 0 %
HCT: 33.8 % — ABNORMAL LOW (ref 36.0–46.0)
Hemoglobin: 11.3 g/dL — ABNORMAL LOW (ref 12.0–15.0)
Lymphocytes Relative: 15 %
Lymphs Abs: 1.8 10*3/uL (ref 0.7–4.0)
MCH: 31.7 pg (ref 26.0–34.0)
MCHC: 33.4 g/dL (ref 30.0–36.0)
MCV: 94.9 fL (ref 80.0–100.0)
Metamyelocytes Relative: 1 %
Monocytes Absolute: 0.7 10*3/uL (ref 0.1–1.0)
Monocytes Relative: 6 %
Myelocytes: 2 %
Neutro Abs: 8.9 10*3/uL — ABNORMAL HIGH (ref 1.7–7.7)
Neutrophils Relative %: 76 %
Platelets: 288 10*3/uL (ref 150–400)
RBC: 3.56 MIL/uL — ABNORMAL LOW (ref 3.87–5.11)
RDW: 14 % (ref 11.5–15.5)
WBC: 11.7 10*3/uL — ABNORMAL HIGH (ref 4.0–10.5)
nRBC: 0 % (ref 0.0–0.2)

## 2022-05-26 LAB — GLUCOSE, CAPILLARY
Glucose-Capillary: 109 mg/dL — ABNORMAL HIGH (ref 70–99)
Glucose-Capillary: 122 mg/dL — ABNORMAL HIGH (ref 70–99)
Glucose-Capillary: 196 mg/dL — ABNORMAL HIGH (ref 70–99)
Glucose-Capillary: 238 mg/dL — ABNORMAL HIGH (ref 70–99)
Glucose-Capillary: 363 mg/dL — ABNORMAL HIGH (ref 70–99)
Glucose-Capillary: 82 mg/dL (ref 70–99)

## 2022-05-26 LAB — CBC
HCT: 35.5 % — ABNORMAL LOW (ref 36.0–46.0)
Hemoglobin: 11.5 g/dL — ABNORMAL LOW (ref 12.0–15.0)
MCH: 31.6 pg (ref 26.0–34.0)
MCHC: 32.4 g/dL (ref 30.0–36.0)
MCV: 97.5 fL (ref 80.0–100.0)
Platelets: 319 10*3/uL (ref 150–400)
RBC: 3.64 MIL/uL — ABNORMAL LOW (ref 3.87–5.11)
RDW: 14.1 % (ref 11.5–15.5)
WBC: 11.6 10*3/uL — ABNORMAL HIGH (ref 4.0–10.5)
nRBC: 0 % (ref 0.0–0.2)

## 2022-05-26 LAB — BASIC METABOLIC PANEL
Anion gap: 14 (ref 5–15)
Anion gap: 11 (ref 5–15)
BUN: 64 mg/dL — ABNORMAL HIGH (ref 8–23)
BUN: 60 mg/dL — ABNORMAL HIGH (ref 8–23)
CO2: 13 mmol/L — ABNORMAL LOW (ref 22–32)
CO2: 13 mmol/L — ABNORMAL LOW (ref 22–32)
Calcium: 8.8 mg/dL — ABNORMAL LOW (ref 8.9–10.3)
Calcium: 8.9 mg/dL (ref 8.9–10.3)
Chloride: 102 mmol/L (ref 98–111)
Chloride: 107 mmol/L (ref 98–111)
Creatinine, Ser: 3.25 mg/dL — ABNORMAL HIGH (ref 0.44–1.00)
Creatinine, Ser: 2.92 mg/dL — ABNORMAL HIGH (ref 0.44–1.00)
GFR, Estimated: 15 mL/min — ABNORMAL LOW (ref >60)
GFR, Estimated: 17 mL/min — ABNORMAL LOW (ref >60)
Glucose, Bld: 336 mg/dL — ABNORMAL HIGH (ref 70–99)
Glucose, Bld: 114 mg/dL — ABNORMAL HIGH (ref 70–99)
Potassium: 4.2 mmol/L (ref 3.5–5.1)
Potassium: 4.6 mmol/L (ref 3.5–5.1)
Sodium: 129 mmol/L — ABNORMAL LOW (ref 135–145)
Sodium: 131 mmol/L — ABNORMAL LOW (ref 135–145)

## 2022-05-26 LAB — MRSA NEXT GEN BY PCR, NASAL: MRSA by PCR Next Gen: POSITIVE — AB

## 2022-05-26 LAB — RENAL FUNCTION PANEL
Albumin: 3.3 g/dL — ABNORMAL LOW (ref 3.5–5.0)
Anion gap: 15 (ref 5–15)
BUN: 66 mg/dL — ABNORMAL HIGH (ref 8–23)
CO2: 15 mmol/L — ABNORMAL LOW (ref 22–32)
Calcium: 9.6 mg/dL (ref 8.9–10.3)
Chloride: 97 mmol/L — ABNORMAL LOW (ref 98–111)
Creatinine, Ser: 3.45 mg/dL — ABNORMAL HIGH (ref 0.44–1.00)
GFR, Estimated: 14 mL/min — ABNORMAL LOW (ref >60)
Glucose, Bld: 449 mg/dL — ABNORMAL HIGH (ref 70–99)
Phosphorus: 6.2 mg/dL — ABNORMAL HIGH (ref 2.5–4.6)
Potassium: 4.6 mmol/L (ref 3.5–5.1)
Sodium: 127 mmol/L — ABNORMAL LOW (ref 135–145)

## 2022-05-26 LAB — CBG MONITORING, ED
Glucose-Capillary: 548 mg/dL (ref 70–99)
Glucose-Capillary: 505 mg/dL (ref 70–99)

## 2022-05-26 LAB — BETA-HYDROXYBUTYRIC ACID: Beta-Hydroxybutyric Acid: 0.23 mmol/L (ref 0.05–0.27)

## 2022-05-26 LAB — LIPASE, BLOOD: Lipase: 212 U/L — ABNORMAL HIGH (ref 11–51)

## 2022-05-26 MED ORDER — CHLORHEXIDINE GLUCONATE CLOTH 2 % EX PADS
6.0000 | MEDICATED_PAD | Freq: Every day | CUTANEOUS | Status: DC
  Administered 2022-05-27: 6 via TOPICAL

## 2022-05-26 MED ORDER — DEXTROSE 50 % IV SOLN: 0.0000 mL | INTRAVENOUS | Status: DC | PRN

## 2022-05-26 MED ORDER — INSULIN REGULAR(HUMAN) IN NACL 100-0.9 UT/100ML-% IV SOLN: INTRAVENOUS | Status: DC

## 2022-05-26 MED ORDER — LACTATED RINGERS IV SOLN
INTRAVENOUS | Status: DC
Start: 2022-05-26 — End: 2022-05-27

## 2022-05-26 MED ORDER — MORPHINE SULFATE (PF) 4 MG/ML IV SOLN
4.0000 mg | Freq: Once | INTRAVENOUS | Status: AC
  Administered 2022-05-26: 4 mg via INTRAVENOUS
  Filled 2022-05-26: qty 1

## 2022-05-26 MED ORDER — MUPIROCIN 2 % EX OINT
1.0000 | TOPICAL_OINTMENT | Freq: Two times a day (BID) | CUTANEOUS | Status: DC
  Administered 2022-05-26 – 2022-05-27 (×2): 1 via NASAL
  Filled 2022-05-26: qty 22

## 2022-05-26 MED ORDER — INSULIN ASPART 100 UNIT/ML IJ SOLN: 15.0000 [IU] | Freq: Once | INTRAMUSCULAR | Status: DC

## 2022-05-26 MED ORDER — POTASSIUM CHLORIDE 10 MEQ/100ML IV SOLN
10.0000 meq | INTRAVENOUS | Status: AC
  Administered 2022-05-26 (×2): 10 meq via INTRAVENOUS
  Filled 2022-05-26 (×2): qty 100

## 2022-05-26 MED ORDER — DEXTROSE IN LACTATED RINGERS 5 % IV SOLN: INTRAVENOUS | Status: DC

## 2022-05-26 MED ORDER — ENOXAPARIN SODIUM 30 MG/0.3ML IJ SOSY
30.0000 mg | PREFILLED_SYRINGE | INTRAMUSCULAR | Status: DC
Start: 2022-05-26 — End: 2022-05-27
  Administered 2022-05-26: 30 mg via SUBCUTANEOUS
  Filled 2022-05-26: qty 0.3

## 2022-05-26 MED ORDER — LEVETIRACETAM 500 MG PO TABS
500.0000 mg | ORAL_TABLET | Freq: Two times a day (BID) | ORAL | Status: DC
  Administered 2022-05-26 – 2022-05-27 (×2): 500 mg via ORAL
  Filled 2022-05-26 (×2): qty 1

## 2022-05-26 MED ORDER — INSULIN REGULAR(HUMAN) IN NACL 100-0.9 UT/100ML-% IV SOLN
INTRAVENOUS | Status: DC
  Administered 2022-05-26: 6.5 [IU]/h via INTRAVENOUS
  Filled 2022-05-26: qty 100

## 2022-05-26 MED ORDER — ONDANSETRON HCL 4 MG/2ML IJ SOLN
4.0000 mg | Freq: Four times a day (QID) | INTRAMUSCULAR | Status: DC | PRN
  Administered 2022-05-26 – 2022-05-27 (×2): 4 mg via INTRAVENOUS
  Filled 2022-05-26 (×2): qty 2

## 2022-05-26 MED ORDER — SODIUM CHLORIDE 0.9 % IV BOLUS
1000.0000 mL | Freq: Once | INTRAVENOUS | Status: AC
  Administered 2022-05-26: 1000 mL via INTRAVENOUS

## 2022-05-26 MED ORDER — ONDANSETRON HCL 4 MG/2ML IJ SOLN
4.0000 mg | Freq: Once | INTRAMUSCULAR | Status: AC
  Administered 2022-05-26: 4 mg via INTRAVENOUS
  Filled 2022-05-26: qty 2

## 2022-05-26 MED ORDER — HYDROMORPHONE HCL 1 MG/ML IJ SOLN
1.0000 mg | INTRAMUSCULAR | Status: DC | PRN
  Administered 2022-05-26 – 2022-05-27 (×4): 1 mg via INTRAVENOUS
  Filled 2022-05-26 (×4): qty 1

## 2022-05-26 MED ORDER — LACTATED RINGERS IV BOLUS
20.0000 mL/kg | Freq: Once | INTRAVENOUS | Status: AC
  Administered 2022-05-26: 1000 mL via INTRAVENOUS

## 2022-05-26 MED ORDER — LACTATED RINGERS IV BOLUS
20.0000 mL/kg | Freq: Once | INTRAVENOUS | Status: AC
  Administered 2022-05-26: 1044 mL via INTRAVENOUS

## 2022-05-26 MED ORDER — LACTATED RINGERS IV SOLN: INTRAVENOUS | Status: DC

## 2022-05-26 NOTE — H&P (Signed)
History and Physical    Patient: Heather [name] ZHY:[id number] DOB: 02-Jun-1951 DOA: 05/26/2022 DOS: the patient was seen and examined on 05/26/2022 PCP: [name], MD  Patient coming from: Home  Chief Complaint:  Chief Complaint  Patient presents with   Abdominal Pain    C/O pain 10/10 in ABD and lower back, no distress noted except verbal responses. Husband by side going to CT.   HPI: Heather [name] is a 71 y.o. female with medical history significant of stage III chronic kidney disease, insulin-dependent and poorly controlled diabetes, hypertension, depression, benzodiazepine dependent anxiety, coronary artery disease history of stroke.  Patient has been having increased abdominal pain since the beginning of the week.  Her pain starts in her back and radiates to her left upper quadrant.  The pain is sharp, worse after eating and with movement.  It is improved with lying still.  She has noticed over the past couple of weeks that her blood sugars have not been well controlled.    The patient had some blood work done by the nephrologist last week.  It was noted that her lab work was abnormal.  The nephrologist called her today and had her come in today for blood work.  After receiving the results, the patient was instructed to come to the emergency department for evaluation  Review of Systems: As mentioned in the history of present illness. All other systems reviewed and are negative. Past Medical History:  Diagnosis Date   Acute renal failure superimposed on stage 3 chronic kidney disease (Heather [name]) 01/21/2016   Anemia    Anxiety    B12 deficiency    Brain tumor (benign) (Heather [name]) 10/29/2020   Cellulitis and abscess    Abdomen and buttocks   Chronic diastolic CHF (congestive heart failure) (Heather [name]) 03/16/2022   CKD (chronic kidney disease) stage 3, GFR 30-59 ml/min (HCC)    Closed fracture of distal end of femur, unspecified fracture morphology, initial encounter (Heather [name])    Coronary  arteriosclerosis 07/15/2018   Crohn disease ([place])    Depression    Diabetic neuropathy (HCC)    feet   DJD (degenerative joint disease)    Right forminal stenosis C4-5   Essential hypertension    Femur fracture, right ([place]) 29/52/8413   Folliculitis    GERD (gastroesophageal reflux disease)    Gout    Headache(784.0)    Hiatal hernia    History of blood transfusion    History of bronchitis    History of cardiac catheterization    No significant CAD 2012   History of kidney stones    surgery to remove   Insomnia    Irritable bowel syndrome    Mixed hyperlipidemia    Osteoarthritis    Palpitations    Pneumonia    Reflux esophagitis    Salmonella    Stroke [facility])    Tinnitus    Right   Type 2 diabetes mellitus (Heather [name])    Past Surgical History:  Procedure Laterality Date   BACK SURGERY     c-spine surgery     03/2007   CARDIAC CATHETERIZATION     CHOLECYSTECTOMY     COLONOSCOPY  04/21/2011; 05/29/11   6/12: [place] - ?AVM at IC valve, inflammatory changes at [facility] valve [facility]); 7/12 - [place]; IC valve erosions, look chronic and probably Crohn's per path   colonoscopy  2017   [place]: random biopsies normal. TI normal   ESOPHAGOGASTRODUODENOSCOPY  05/29/11   normal   ESOPHAGOGASTRODUODENOSCOPY  N/A 01/21/2016   Dr. Michail Sermon: normal   EYE SURGERY Bilateral    cataracts removed   FEMUR IM NAIL Right 04/10/2018   Procedure: INTRAMEDULLARY (IM) NAIL FEMORAL;  Surgeon: Paralee Cancel, MD;  Location: [facility];  Service: Orthopedics;  Laterality: Right;   FRACTURE SURGERY     GIVENS CAPSULE STUDY  11/17/2020   HARDWARE REMOVAL Right 04/10/2018   Procedure: HARDWARE REMOVAL RIGHT DISTAL FEMUR;  Surgeon: Paralee Cancel, MD;  Location: [facility];  Service: Orthopedics;  Laterality: Right;   HERNIA REPAIR     IR FLUORO GUIDE CV LINE RIGHT  04/01/2017   IR US GUIDE VASC ACCESS RIGHT  04/01/2017   JOINT REPLACEMENT Right    hip   OPEN REDUCTION INTERNAL FIXATION (ORIF) DISTAL  RADIAL FRACTURE Right 05/28/2019   Procedure: OPEN REDUCTION INTERNAL FIXATION (ORIF) DISTAL RADIAL FRACTURE;  Surgeon: Verner Mould, MD;  Location: [place];  Service: Orthopedics;  Laterality: Right;  with block 90 minutes   ORIF FEMUR FRACTURE Right 03/31/2017   Procedure: OPEN REDUCTION INTERNAL FIXATION (ORIF) DISTAL FEMUR FRACTURE;  Surgeon: Paralee Cancel, MD;  Location: [place];  Service: Orthopedics;  Laterality: Right;   removal of kidney stone     Right knee arthroscopic surgery     TONSILLECTOMY     TOTAL ABDOMINAL HYSTERECTOMY     Social History:  reports that she has never smoked. She has never used smokeless tobacco. She reports that she does not drink alcohol and does not use drugs.  Allergies  Allergen Reactions   Cozaar [Losartan] Nausea And Vomiting, Swelling and Rash   Quinolones Itching, Nausea And Vomiting, Nausea Only and Swelling    Swelling of face, jaw, and lips    Cymbalta [Duloxetine Hcl] Itching and Swelling   Sulfa Antibiotics Nausea And Vomiting and Swelling    Headache  Swelling of feet, legs    Synalar [Fluocinolone] Swelling   Cipro [Ciprofloxacin Hcl] Nausea And Vomiting and Swelling    Swelling of face, jaw, lips   Diovan [Valsartan] Nausea And Vomiting and Swelling   Glucophage [Metformin] Other (See Comments)    GI upset   Ketalar [Ketamine] Swelling   Norco [Hydrocodone-Acetaminophen] Itching and Swelling   Norvasc [Amlodipine Besylate] Itching and Swelling    Fluid retention    Nsaids Nausea And Vomiting and Other (See Comments)    Has bleeding ulcers   Tylenol [Acetaminophen] Itching and Swelling   Tape Itching and Rash    Paper tape can only be used on this patient   Zestril [Lisinopril] Swelling and Rash    Oral swelling Red streaks on arms/legs    Family History  Problem Relation Age of Onset   Diabetes Mother    Colon cancer Brother        POSSIBLY. Patient states diagnosed at age 89, then later states age 69. unclear and  limited historian   Esophageal cancer Neg Hx    Rectal cancer Neg Hx    Stomach cancer Neg Hx     Prior to Admission medications   Medication Sig Start Date End Date Taking? Authorizing Provider  acetaminophen (TYLENOL) 325 MG tablet Take 2 tablets (650 mg total) by mouth every 4 (four) hours. 12/01/21   Johnson, Clanford L, MD  allopurinol (ZYLOPRIM) 100 MG tablet Take 100 mg by mouth daily. 03/20/21   [provider]  Ascorbic Acid (VITAMIN C WITH ROSE HIPS) 500 MG tablet Take 500 mg by mouth every morning.    [provider]  atorvastatin (LIPITOR) 80 MG tablet Take 80 mg by mouth daily. 01/03/22   [provider]  busPIRone (BUSPAR) 10 MG tablet Take 10 mg by mouth 3 (three) times daily as needed for anxiety. 03/04/22   [provider]  calcitRIOL (ROCALTROL) 0.25 MCG capsule Take 0.25 mcg by mouth 3 (three) times a week. Monday,Wed and Fri. 10/05/21   [provider]  calcium carbonate (OS-CAL) 1250 (500 Ca) MG chewable tablet Chew 1 tablet by mouth 2 (two) times daily. 10/04/21 10/04/22  [provider]  Cholecalciferol (VITAMIN D) 2000 units CAPS Take 2,000 Units by mouth every morning.     [provider]  cyanocobalamin (,VITAMIN B-12,) 1000 MCG/ML injection Inject 1,000 mcg into the muscle every 30 (thirty) days.      [provider]  diazepam (VALIUM) 5 MG tablet Take 1 tablet (5 mg total) by mouth every 12 (twelve) hours as needed for anxiety. 12/01/21   Johnson, Clanford L, MD  dicyclomine (BENTYL) 20 MG tablet Take 1 tablet (20 mg total) by mouth in the morning and at bedtime. 12/01/21   Johnson, Clanford L, MD  diphenhydrAMINE (BENADRYL) 2 % cream Apply topically 3 (three) times daily as needed for itching. 12/01/21   Johnson, Clanford L, MD  esomeprazole (NEXIUM) 40 MG capsule Take 40 mg by mouth daily before breakfast.      [provider]  furosemide (LASIX) 40 MG tablet Take 40 mg by mouth 2 (two) times daily.     [provider]  gabapentin (NEURONTIN) 300 MG capsule Take 1 capsule (300 mg total) by mouth at bedtime. Patient taking differently: Take 300 mg by mouth 3 (three) times daily. 12/01/21   Johnson, Clanford L, MD  hydrALAZINE (APRESOLINE) 25 MG tablet Take 25 mg by mouth in the morning and at bedtime.    [provider]  insulin detemir (LEVEMIR FLEXPEN) 100 UNIT/ML FlexPen Inject 10 Units into the skin at bedtime. 04/25/22   Brita Romp, NP  insulin glargine (LANTUS SOLOSTAR) 100 UNIT/ML Solostar Pen Inject 0-10 Units into the skin at bedtime. Sliding Scale    [provider]  insulin lispro (HUMALOG) 100 UNIT/ML injection Inject 0-10 Units into the skin as directed. Sliding Scale. BS 150-200=2 units, 201-250-4 units, 251-300=6 units, 301-350=8 units, 351-400-10 units; BS >400 or <60 CALL MD.    [provider]  levETIRAcetam (KEPPRA) 500 MG tablet Take 500 mg by mouth 2 (two) times daily.    [provider]  metoprolol tartrate (LOPRESSOR) 100 MG tablet Take 100 mg by mouth daily.    [provider]  polyethylene glycol (MIRALAX / GLYCOLAX) 17 g packet Take 17 g by mouth daily. Patient taking differently: Take 17 g by mouth daily as needed for moderate constipation. 12/02/21   Johnson, Clanford L, MD  potassium chloride SA (KLOR-CON M) 20 MEQ tablet Take 20 mEq by mouth every other day.    [provider]  sodium bicarbonate 650 MG tablet Take 650 mg by mouth 3 (three) times daily. 10/05/21   [provider]    Physical Exam: Vitals:   05/26/22 1415 05/26/22 1430 05/26/22 1443 05/26/22 1445  BP:      Pulse: 85 84    Resp: '11 14  16  '$ Temp:      TempSrc:      SpO2: 100% 100%    Weight:   52.2 kg   Height:   '5\' 2"'$  (1.575 m)    General:  Elderly female. Awake and alert and oriented x3. No acute cardiopulmonary distress.  HEENT: Normocephalic atraumatic.  Right and left ears normal in appearance.  Pupils equal, round,  reactive to light. Extraocular muscles are intact. Sclerae anicteric and noninjected.  Moist mucosal membranes. No mucosal lesions.  Neck: Neck supple without lymphadenopathy. No carotid bruits. No masses palpated.  Cardiovascular: Regular rate with normal S1-S2 sounds. No murmurs, rubs, gallops auscultated. No JVD.  Respiratory: Good respiratory effort with no wheezes, rales, rhonchi. Lungs clear to auscultation bilaterally.  No accessory muscle use. Abdomen: Soft, tender in the epigastric and left upper quadrant area without rebound or guarding. Nondistended. Active bowel sounds. No masses or hepatosplenomegaly  Skin: No rashes, lesions, or ulcerations.  Dry, warm to touch. 2+ dorsalis pedis and radial pulses. Musculoskeletal: No calf or leg pain. All major joints not erythematous nontender.  No upper or lower joint deformation.  Good ROM.  No contractures  Psychiatric: Intact judgment and insight. Pleasant and cooperative. Neurologic: No focal neurological deficits. Strength is 5/5 and symmetric in upper and lower extremities.  Cranial nerves II through XII are grossly intact.  Data Reviewed: Results for orders placed or performed during the hospital encounter of 05/26/22 (from the past 24 hour(s))  Lipase, blood     Status: Abnormal   Collection Time: 05/26/22  1:32 PM  Result Value Ref Range   Lipase 212 (H) 11 - 51 U/L  Comprehensive metabolic panel     Status: Abnormal   Collection Time: 05/26/22  1:32 PM  Result Value Ref Range   Sodium 125 (L) 135 - 145 mmol/L   Potassium 4.6 3.5 - 5.1 mmol/L   Chloride 94 (L) 98 - 111 mmol/L   CO2 18 (L) 22 - 32 mmol/L   Glucose, Bld 580 (HH) 70 - 99 mg/dL   BUN 69 (H) 8 - 23 mg/dL   Creatinine, Ser 3.47 (H) 0.44 - 1.00 mg/dL   Calcium 9.3 8.9 - 10.3 mg/dL   Total Protein 7.4 6.5 - 8.1 g/dL   Albumin 3.1 (L) 3.5 - 5.0 g/dL   AST 26 15 - 41 U/L   ALT 21 0 - 44 U/L   Alkaline Phosphatase 111 38 - 126 U/L   Total Bilirubin 0.7 0.3 - 1.2 mg/dL    GFR, Estimated 14 (L) >60 mL/min   Anion gap 13 5 - 15  CBC with Differential     Status: Abnormal   Collection Time: 05/26/22  2:04 PM  Result Value Ref Range   WBC 11.7 (H) 4.0 - 10.5 K/uL   RBC 3.56 (L) 3.87 - 5.11 MIL/uL   Hemoglobin 11.3 (L) 12.0 - 15.0 g/dL   HCT 33.8 (L) 36.0 - 46.0 %   MCV 94.9 80.0 - 100.0 fL   MCH 31.7 26.0 - 34.0 pg   MCHC 33.4 30.0 - 36.0 g/dL   RDW 14.0 11.5 - 15.5 %   Platelets 288 150 - 400 K/uL   nRBC 0.0 0.0 - 0.2 %   Neutrophils Relative % 76 %   Neutro Abs 8.9 (H) 1.7 - 7.7 K/uL   Lymphocytes Relative 15 %   Lymphs Abs 1.8 0.7 - 4.0 K/uL   Monocytes Relative 6 %   Monocytes Absolute 0.7 0.1 - 1.0 K/uL   Eosinophils Relative 0 %   Eosinophils Absolute 0.0 0.0 - 0.5 K/uL   Basophils Relative 0 %   Basophils Absolute 0.0 0.0 - 0.1 K/uL   WBC Morphology MILD LEFT SHIFT (1-5%  METAS, OCC MYELO, OCC BANDS)    RBC Morphology MORPHOLOGY UNREMARKABLE    Smear Review MORPHOLOGY UNREMARKABLE    Metamyelocytes Relative 1 %   Myelocytes 2 %   Abs Immature Granulocytes 0.40 (H) 0.00 - 0.07 K/uL  CBG monitoring, ED     Status: Abnormal   Collection Time: 05/26/22  2:26 PM  Result Value Ref Range   Glucose-Capillary 548 (HH) 70 - 99 mg/dL   Comment 1 Notify RN    Comment 2 Document in Chart    Comment 3 Call MD NNP PA CNM   Beta-hydroxybutyric acid     Status: None   Collection Time: 05/26/22  3:00 PM  Result Value Ref Range   Beta-Hydroxybutyric Acid 0.23 0.05 - 0.27 mmol/L  Blood gas, venous     Status: Abnormal   Collection Time: 05/26/22  4:20 PM  Result Value Ref Range   FIO2 21.0 %   pH, Ven 7.21 (L) 7.25 - 7.43   pCO2, Ven 47 44 - 60 mmHg   pO2, Ven <31 (LL) 32 - 45 mmHg   Bicarbonate 18.8 (L) 20.0 - 28.0 mmol/L   Acid-base deficit 9.3 (H) 0.0 - 2.0 mmol/L   O2 Saturation 24.3 %   Patient temperature 36.4    Collection site BLOOD LEFT HAND    Drawn by 0630   CBG monitoring, ED     Status: Abnormal   Collection Time: 05/26/22  4:33  PM  Result Value Ref Range   Glucose-Capillary 505 (HH) 70 - 99 mg/dL   Comment 1 Notify RN    CT ABDOMEN PELVIS WO CONTRAST  Result Date: 05/26/2022 CLINICAL DATA:  Left lower quadrant abdominal pain. Nausea and vomiting. EXAM: CT ABDOMEN AND PELVIS WITHOUT CONTRAST TECHNIQUE: Multidetector CT imaging of the abdomen and pelvis was performed following the standard protocol without IV contrast. RADIATION DOSE REDUCTION: This exam was performed according to the departmental dose-optimization program which includes automated exposure control, adjustment of the mA and/or kV according to patient size and/or use of iterative reconstruction technique. COMPARISON:  CT abdomen pelvis 01/07/2021. FINDINGS: Lower chest: Subtle scattered centrilobular ground-glass opacities in the visualized lower lobes and right middle lobe. No pleural effusion. A 0.5 cm solid nodule in the left lung base is stable compared to 03/02/2020, consistent with benignity (series 4, image 26). Hepatobiliary: No focal liver abnormality is seen. Status post cholecystectomy. No biliary dilatation. Pancreas: Subtle peripancreatic fat stranding is noted along the tail of the pancreas. No peripancreatic fluid collection. The pancreas does not appear enlarged. Spleen: Normal in size. Numerous punctate calcifications may reflect calcified granulomas versus vascular calcifications. Adrenals/Urinary Tract: Adrenal glands are unremarkable. A 0.8 cm exophytic lesion at the posterior aspect of the middle third of the left kidney demonstrates attenuation of -23 Hounsfield units, consistent with a fat containing mass (series 2, image 36). No associated calcifications. The mass is stable compared to CT dated 02/05/2020 and is consistent with a stable angiomyolipoma; no additional dedicated follow-up imaging is indicated. A punctate nonobstructive calyceal stone is noted in the upper pole of the right kidney (series 6, image 55). Urinary bladder is minimally  filled and unremarkable given degree of distention. Stomach/Bowel: Stomach is within normal limits. The appendix is not directly visualized, however there are no pericecal inflammatory changes. No evidence of bowel wall thickening, distention, or inflammatory changes. Vascular/Lymphatic: Aortic atherosclerosis. Severe calcific atherosclerosis at the origins of the renal artery and throughout the splenic artery. No enlarged abdominal or pelvic lymph nodes.  Reproductive: Status post hysterectomy. A 1.4 cm benign right ovarian cyst is noted; no additional dedicated follow-up imaging is indicated. Other: No abdominal wall hernia or abnormality. No abdominopelvic ascites. Musculoskeletal: Compression fracture of the T12 vertebral body with approximately 50% vertebral height loss, Heather compared to chest radiograph 10/18/2021. No suspicious osseous findings. Bilateral pedicle screw and rod fixation spans L1-L4 with intervertebral spacers at L2-L3 and L3-L4. IMPRESSION: 1. Mild fat stranding surrounding the tail of the pancreas, suggestive of acute pancreatitis. No peripancreatic fluid collection. Recommend correlation with lipase levels. 2. Compression fracture of the T12 vertebral body, with approximately 50% vertebral body height loss, of unknown chronicity. This finding is Heather since prior chest radiograph 10/18/2021. 3. Nonobstructive right nephrolithiasis. 4. Subtle centrilobular ground-glass opacities in the visualized lower lungs may reflect bronchiolitis or hypersensitivity pneumonitis. 5.  Aortic Atherosclerosis (ICD10-I70.0). Electronically Signed   By: Ileana Roup M.D.   On: 05/26/2022 15:07     Assessment and Plan: No notes have been filed under this hospital service. Service: Hospitalist  Principal Problem:   Type 2 diabetes mellitus with hyperosmolar hyperglycemic state (HHS) ([place]) Active Problems:   Acute renal failure superimposed on stage 3b chronic kidney disease ([place])   Essential hypertension    Acute pancreatitis  Type 2 diabetes with hyperosmolar hyperglycemic state with poor control Admit to stepdown admit to stepdown Telemetry monitoring Continue aggressive IV hydration CBGs every hour Basic metabolic panel every 4 hours Potassium replacement: 10 mEq IV runs 2 Noncaloric liquids Transition to D5 normal saline at 150 mL's per hour with CBG at 250 Transition to home insulin regimen when gap is closed and acidosis resolved. Acute renal failure with superimposed chronic kidney disease We will continue to monitor creatinine. Hold Lasix for now.  We will be cautious as the patient recently get into heart failure Pancreatitis Continue IV fluids and recheck lipase in the morning Hypertension Continue home regimen   Advance Care Planning:   Code Status: Prior full code confirmed by patient  Consults: None  Family Communication: None  Severity of Illness: The appropriate patient status for this patient is INPATIENT. Inpatient status is judged to be reasonable and necessary in order to provide the required intensity of service to ensure the patient's safety. The patient's presenting symptoms, physical exam findings, and initial radiographic and laboratory data in the context of their chronic comorbidities is felt to place them at high risk for further clinical deterioration. Furthermore, it is not anticipated that the patient will be medically stable for discharge from the hospital within 2 midnights of admission.   * I certify that at the point of admission it is my clinical judgment that the patient will require inpatient hospital care spanning beyond 2 midnights from the point of admission due to high intensity of service, high risk for further deterioration and high frequency of surveillance required.*  Author: Truett Mainland, DO 05/26/2022 4:25 PM  For on call review [email or web address].

## 2022-05-26 NOTE — ED Notes (Signed)
Attempted IV x4 for 2nd IV, unsuccessful despite [place]. IM MD at [facility]. Pt remains alert, NAD, calm, interactive, resps e/u, speaking in clear complete sentences. VSS.

## 2022-05-26 NOTE — ED Notes (Signed)
Pt c/o her 1 IV line. It is questionably patent. She has had multiple people try with [place] with variable/ limited success. Previous lines have become not patent. There is a delay in IVF and meds. EDP and responsible IM provider notified.

## 2022-05-26 NOTE — ED Notes (Signed)
EDP at [facility] 

## 2022-05-26 NOTE — ED Notes (Addendum)
18G not 20 in upper rt arm, patient difficult stick had to use Sonosite for IV access.. Removed 20G from [facility]

## 2022-05-26 NOTE — ED Notes (Signed)
Lab at [facility], additional and repeat labs drawn

## 2022-05-26 NOTE — ED Triage Notes (Signed)
Pt reports being sick all week. Hurting and throwing up. Pt sent by kidney doctor. BG elevated. Pt reports her kidney function being down

## 2022-05-26 NOTE — ED Notes (Signed)
Lab reports VBG specimen mysteriously showed up, too old and unable to use, needs re-draw.

## 2022-05-26 NOTE — ED Notes (Addendum)
Critical Lab: pO2 < 31.  Provider notified.

## 2022-05-26 NOTE — ED Notes (Signed)
Endo tool orders no change, insulin continues at 6.5units/hr

## 2022-05-26 NOTE — ED Provider Notes (Signed)
[facility] EMERGENCY DEPARTMENT Provider Note   CSN: [id number] Arrival date & time: 05/26/22  1256     History  Chief Complaint  Patient presents with   Abdominal Pain    C/O pain 10/10 in ABD and lower back, no distress noted except verbal responses. Husband by side going to CT.    Heather [name] is a 71 y.o. female.  HPI  This patient is a 71 year old female, she is a known diabetic on insulin, she also states she has a history of diverticulitis and she has some chronic kidney disease.  For the last week she has been having intermittent fevers nausea decreased appetite significant weight loss and has been having increasing abdominal pain.  Temperature as high as 101 at home.  She went to her kidney doctor today because of her chronic kidney disease and was found to be ill-appearing, dehydrated in appearance and after lab work was done it was clear that she was hyperglycemic and had a low CO2 consistent with possible DKA.  The patient reports that despite eating hardly anything this week her sugars have been climbing steadily despite the use of insulin.  She has no dysuria, no coughing or shortness of breath, she is generally weak and gradually getting worse.  Home Medications Prior to Admission medications   Medication Sig Start Date End Date Taking? Authorizing Provider  acetaminophen (TYLENOL) 325 MG tablet Take 2 tablets (650 mg total) by mouth every 4 (four) hours. 12/01/21   Johnson, Clanford L, MD  allopurinol (ZYLOPRIM) 100 MG tablet Take 100 mg by mouth daily. 03/20/21   [provider]  Ascorbic Acid (VITAMIN C WITH ROSE HIPS) 500 MG tablet Take 500 mg by mouth every morning.    [provider]  atorvastatin (LIPITOR) 80 MG tablet Take 80 mg by mouth daily. 01/03/22   [provider]  busPIRone (BUSPAR) 10 MG tablet Take 10 mg by mouth 3 (three) times daily as needed for anxiety. 03/04/22   [provider]  calcitRIOL (ROCALTROL) 0.25 MCG  capsule Take 0.25 mcg by mouth 3 (three) times a week. Monday,Wed and Fri. 10/05/21   [provider]  calcium carbonate (OS-CAL) 1250 (500 Ca) MG chewable tablet Chew 1 tablet by mouth 2 (two) times daily. 10/04/21 10/04/22  [provider]  Cholecalciferol (VITAMIN D) 2000 units CAPS Take 2,000 Units by mouth every morning.     [provider]  cyanocobalamin (,VITAMIN B-12,) 1000 MCG/ML injection Inject 1,000 mcg into the muscle every 30 (thirty) days.      [provider]  diazepam (VALIUM) 5 MG tablet Take 1 tablet (5 mg total) by mouth every 12 (twelve) hours as needed for anxiety. 12/01/21   Johnson, Clanford L, MD  dicyclomine (BENTYL) 20 MG tablet Take 1 tablet (20 mg total) by mouth in the morning and at bedtime. 12/01/21   Johnson, Clanford L, MD  diphenhydrAMINE (BENADRYL) 2 % cream Apply topically 3 (three) times daily as needed for itching. 12/01/21   Johnson, Clanford L, MD  esomeprazole (NEXIUM) 40 MG capsule Take 40 mg by mouth daily before breakfast.      [provider]  furosemide (LASIX) 40 MG tablet Take 40 mg by mouth 2 (two) times daily.    [provider]  gabapentin (NEURONTIN) 300 MG capsule Take 1 capsule (300 mg total) by mouth at bedtime. Patient taking differently: Take 300 mg by mouth 3 (three) times daily. 12/01/21   Murlean Iba, MD  hydrALAZINE (APRESOLINE) 25 MG tablet Take 25 mg by mouth in the morning and at bedtime.    [provider]  insulin detemir (LEVEMIR FLEXPEN) 100 UNIT/ML FlexPen Inject 10 Units into the skin at bedtime. 04/25/22   Brita Romp, NP  insulin glargine (LANTUS SOLOSTAR) 100 UNIT/ML Solostar Pen Inject 0-10 Units into the skin at bedtime. Sliding Scale    [provider]  insulin lispro (HUMALOG) 100 UNIT/ML injection Inject 0-10 Units into the skin as directed. Sliding Scale. BS 150-200=2 units, 201-250-4 units, 251-300=6 units, 301-350=8 units, 351-400-10 units; BS  >400 or <60 CALL MD.    [provider]  levETIRAcetam (KEPPRA) 500 MG tablet Take 500 mg by mouth 2 (two) times daily.    [provider]  metoprolol tartrate (LOPRESSOR) 100 MG tablet Take 100 mg by mouth daily.    [provider]  polyethylene glycol (MIRALAX / GLYCOLAX) 17 g packet Take 17 g by mouth daily. Patient taking differently: Take 17 g by mouth daily as needed for moderate constipation. 12/02/21   Johnson, Clanford L, MD  potassium chloride SA (KLOR-CON M) 20 MEQ tablet Take 20 mEq by mouth every other day.    [provider]  sodium bicarbonate 650 MG tablet Take 650 mg by mouth 3 (three) times daily. 10/05/21   [provider]      Allergies    Cozaar [losartan], Quinolones, Cymbalta [duloxetine hcl], Sulfa antibiotics, Synalar [fluocinolone], Cipro [ciprofloxacin hcl], Diovan [valsartan], Glucophage [metformin], Ketalar [ketamine], Norco [hydrocodone-acetaminophen], Norvasc [amlodipine besylate], Nsaids, Tylenol [acetaminophen], Tape, and Zestril [lisinopril]    Review of Systems   Review of Systems  All other systems reviewed and are negative.   Physical Exam Updated Vital Signs BP (!) 142/68 (BP Location: Left Arm)   Pulse 84   Temp (!) 97.5 F (36.4 C) (Oral)   Resp 16   Ht 1.575 m ('5\' 2"'$ )   Wt 52.2 kg   LMP  (LMP Unknown)   SpO2 100%   BMI 21.03 kg/m  Physical Exam Vitals and nursing note reviewed.  Constitutional:      General: She is not in acute distress.    Appearance: She is well-developed.  HENT:     Head: Normocephalic and atraumatic.     Mouth/Throat:     Mouth: Mucous membranes are dry.     Pharynx: No oropharyngeal exudate.  Eyes:     General: No scleral icterus.       Right eye: No discharge.        Left eye: No discharge.     Conjunctiva/sclera: Conjunctivae normal.     Pupils: Pupils are equal, round, and reactive to light.  Neck:     Thyroid: No thyromegaly.     Vascular: No JVD.   Cardiovascular:     Rate and Rhythm: Normal rate and regular rhythm.     Heart sounds: Normal heart sounds. No murmur heard.    No friction rub. No gallop.  Pulmonary:     Effort: Pulmonary effort is normal. No respiratory distress.     Breath sounds: Normal breath sounds. No wheezing or rales.  Abdominal:     General: Bowel sounds are normal. There is no distension.     Palpations: Abdomen is soft. There is no mass.     Tenderness: There is abdominal tenderness.  Musculoskeletal:        General: No tenderness. Normal range of motion.     Cervical back: Normal range of motion  and neck supple.     Right lower leg: No edema.     Left lower leg: No edema.  Lymphadenopathy:     Cervical: No cervical adenopathy.  Skin:    General: Skin is warm and dry.     Findings: No erythema or rash.  Neurological:     General: No focal deficit present.     Mental Status: She is alert.     Coordination: Coordination normal.  Psychiatric:        Behavior: Behavior normal.     ED Results / Procedures / Treatments   Labs (all labs ordered are listed, but only abnormal results are displayed) Labs Reviewed  LIPASE, BLOOD - Abnormal; Notable for the following components:      Result Value   Lipase 212 (*)    All other components within normal limits  COMPREHENSIVE METABOLIC PANEL - Abnormal; Notable for the following components:   Sodium 125 (*)    Chloride 94 (*)    CO2 18 (*)    Glucose, Bld 580 (*)    BUN 69 (*)    Creatinine, Ser 3.47 (*)    Albumin 3.1 (*)    GFR, Estimated 14 (*)    All other components within normal limits  CBC WITH DIFFERENTIAL/PLATELET - Abnormal; Notable for the following components:   WBC 11.7 (*)    RBC 3.56 (*)    Hemoglobin 11.3 (*)    HCT 33.8 (*)    All other components within normal limits  CBG MONITORING, ED - Abnormal; Notable for the following components:   Glucose-Capillary 548 (*)    All other components within normal limits  URINE CULTURE   URINALYSIS, ROUTINE W REFLEX MICROSCOPIC  BETA-HYDROXYBUTYRIC ACID  BETA-HYDROXYBUTYRIC ACID  BLOOD GAS, VENOUS    EKG None  Radiology CT ABDOMEN PELVIS WO CONTRAST  Result Date: 05/26/2022 CLINICAL DATA:  Left lower quadrant abdominal pain. Nausea and vomiting. EXAM: CT ABDOMEN AND PELVIS WITHOUT CONTRAST TECHNIQUE: Multidetector CT imaging of the abdomen and pelvis was performed following the standard protocol without IV contrast. RADIATION DOSE REDUCTION: This exam was performed according to the departmental dose-optimization program which includes automated exposure control, adjustment of the mA and/or kV according to patient size and/or use of iterative reconstruction technique. COMPARISON:  CT abdomen pelvis 01/07/2021. FINDINGS: Lower chest: Subtle scattered centrilobular ground-glass opacities in the visualized lower lobes and right middle lobe. No pleural effusion. A 0.5 cm solid nodule in the left lung base is stable compared to 03/02/2020, consistent with benignity (series 4, image 26). Hepatobiliary: No focal liver abnormality is seen. Status post cholecystectomy. No biliary dilatation. Pancreas: Subtle peripancreatic fat stranding is noted along the tail of the pancreas. No peripancreatic fluid collection. The pancreas does not appear enlarged. Spleen: Normal in size. Numerous punctate calcifications may reflect calcified granulomas versus vascular calcifications. Adrenals/Urinary Tract: Adrenal glands are unremarkable. A 0.8 cm exophytic lesion at the posterior aspect of the middle third of the left kidney demonstrates attenuation of -23 Hounsfield units, consistent with a fat containing mass (series 2, image 36). No associated calcifications. The mass is stable compared to CT dated 02/05/2020 and is consistent with a stable angiomyolipoma; no additional dedicated follow-up imaging is indicated. A punctate nonobstructive calyceal stone is noted in the upper pole of the right kidney  (series 6, image 55). Urinary bladder is minimally filled and unremarkable given degree of distention. Stomach/Bowel: Stomach is within normal limits. The appendix is not directly visualized, however there are  no pericecal inflammatory changes. No evidence of bowel wall thickening, distention, or inflammatory changes. Vascular/Lymphatic: Aortic atherosclerosis. Severe calcific atherosclerosis at the origins of the renal artery and throughout the splenic artery. No enlarged abdominal or pelvic lymph nodes. Reproductive: Status post hysterectomy. A 1.4 cm benign right ovarian cyst is noted; no additional dedicated follow-up imaging is indicated. Other: No abdominal wall hernia or abnormality. No abdominopelvic ascites. Musculoskeletal: Compression fracture of the T12 vertebral body with approximately 50% vertebral height loss, new compared to chest radiograph 10/18/2021. No suspicious osseous findings. Bilateral pedicle screw and rod fixation spans L1-L4 with intervertebral spacers at L2-L3 and L3-L4. IMPRESSION: 1. Mild fat stranding surrounding the tail of the pancreas, suggestive of acute pancreatitis. No peripancreatic fluid collection. Recommend correlation with lipase levels. 2. Compression fracture of the T12 vertebral body, with approximately 50% vertebral body height loss, of unknown chronicity. This finding is new since prior chest radiograph 10/18/2021. 3. Nonobstructive right nephrolithiasis. 4. Subtle centrilobular ground-glass opacities in the visualized lower lungs may reflect bronchiolitis or hypersensitivity pneumonitis. 5.  Aortic Atherosclerosis (ICD10-I70.0). Electronically Signed   By: Ileana Roup M.D.   On: 05/26/2022 15:07    Procedures .Critical Care  Performed by: Noemi Chapel, MD Authorized by: Noemi Chapel, MD   Critical care provider statement:    Critical care time (minutes):  30   Critical care time was exclusive of:  Separately billable procedures and treating other patients  and teaching time   Critical care was necessary to treat or prevent imminent or life-threatening deterioration of the following conditions:  Renal failure and endocrine crisis   Critical care was time spent personally by me on the following activities:  Development of treatment plan with patient or surrogate, discussions with consultants, evaluation of patient's response to treatment, examination of patient, ordering and review of laboratory studies, ordering and review of radiographic studies, ordering and performing treatments and interventions, pulse oximetry, re-evaluation of patient's condition, review of old charts and obtaining history from patient or surrogate   I assumed direction of critical care for this patient from another provider in my specialty: no     Care discussed with: admitting provider   Comments:           Medications Ordered in ED Medications  morphine (PF) 4 MG/ML injection 4 mg (has no administration in time range)  lactated ringers bolus 1,044 mL (has no administration in time range)  insulin regular, human (MYXREDLIN) 100 units/ 100 mL infusion (has no administration in time range)  lactated ringers infusion (has no administration in time range)  dextrose 5 % in lactated ringers infusion (has no administration in time range)  dextrose 50 % solution 0-50 mL (has no administration in time range)  potassium chloride 10 mEq in 100 mL IVPB (10 mEq Intravenous New Bag/Given 05/26/22 1513)  sodium chloride 0.9 % bolus 1,000 mL (1,000 mLs Intravenous New Bag/Given 05/26/22 1457)  ondansetron (ZOFRAN) injection 4 mg (4 mg Intravenous Given 05/26/22 1502)    ED Course/ Medical Decision Making/ A&P                           Medical Decision Making Amount and/or Complexity of Data Reviewed Labs: ordered. Radiology: ordered.  Risk Prescription drug management. Decision regarding hospitalization.   This patient presents to the ED for concern of abdominal pain with  fevers and hyperglycemia, this involves an extensive number of treatment options, and is a complaint that carries with  it a high risk of complications and morbidity.  The differential diagnosis includes diverticulitis, pancreatitis, acute surgical causes of her abdominal discomfort, could also be related to urinary tract infection, she may be in DKA which is causing all of this as well.   Co morbidities that complicate the patient evaluation  Diabetes, elderly, known diverticulitis   Additional history obtained:  Additional history obtained from nephrologist who called ahead of time to let me know she was coming External records from outside source obtained and reviewed including labs from earlier in the day show an increased anion gap, additionally the patient has a creatinine that is gone from 2.5-3.5, anion gap today is 15   Lab Tests:  I Ordered, and personally interpreted labs.  The pertinent results include: Repeat labs as well as a urinalysis   Imaging Studies ordered:  I ordered imaging studies including CT scan of the abdomen and pelvis which shows that there is likely pancreatitis I independently visualized and interpreted imaging which showed likely pancreatitis I agree with the radiologist interpretation   Cardiac Monitoring: / EKG:  The patient was maintained on a cardiac monitor.  I personally viewed and interpreted the cardiac monitored which showed an underlying rhythm of: Ongoing normal sinus rhythm with borderline tachycardia at times   Consultations Obtained:  I requested consultation with the hospitalist Dr. Loma Boston,  and discussed lab and imaging findings as well as pertinent plan - they recommend: Admission to the hospital for ongoing treatment of pancreatitis hyperglycemia and worsening renal function given that there is renal failure   Problem List / ED Course / Critical interventions / Medication management  The patient has had multiple abnormal  findings including acute pancreatitis both on the laboratory work-up and CT scan.  She has also had hyperglycemia with a borderline DKA. I ordered medication including IV fluids, pain medicine, hyperglycemic protocol for insulin drip for severe hyperglycemia and renal failure Reevaluation of the patient after these medicines showed that the patient critically ill but improving I have reviewed the patients home medicines and have made adjustments as needed   Social Determinants of Health:  Diabetic   Test / Admission - Considered:  We will admit to the hospital to high level of care         Final Clinical Impression(s) / ED Diagnoses Final diagnoses:  Acute pancreatitis, unspecified complication status, unspecified pancreatitis type  Acute renal failure superimposed on chronic kidney disease, unspecified CKD stage, unspecified acute renal failure type ([place])  Hyperglycemia  Marland Kitchen   Noemi Chapel, MD 05/26/22 1520

## 2022-05-27 DIAGNOSIS — E1100 Type 2 diabetes mellitus with hyperosmolarity without nonketotic hyperglycemic-hyperosmolar coma (NKHHC): Secondary | ICD-10-CM | POA: Diagnosis not present

## 2022-05-27 LAB — BASIC METABOLIC PANEL
Anion gap: 9 (ref 5–15)
Anion gap: 9 (ref 5–15)
BUN: 58 mg/dL — ABNORMAL HIGH (ref 8–23)
BUN: 58 mg/dL — ABNORMAL HIGH (ref 8–23)
CO2: 18 mmol/L — ABNORMAL LOW (ref 22–32)
CO2: 16 mmol/L — ABNORMAL LOW (ref 22–32)
Calcium: 8.8 mg/dL — ABNORMAL LOW (ref 8.9–10.3)
Calcium: 8.5 mg/dL — ABNORMAL LOW (ref 8.9–10.3)
Chloride: 105 mmol/L (ref 98–111)
Chloride: 107 mmol/L (ref 98–111)
Creatinine, Ser: 2.86 mg/dL — ABNORMAL HIGH (ref 0.44–1.00)
Creatinine, Ser: 2.68 mg/dL — ABNORMAL HIGH (ref 0.44–1.00)
GFR, Estimated: 17 mL/min — ABNORMAL LOW (ref >60)
GFR, Estimated: 19 mL/min — ABNORMAL LOW (ref >60)
Glucose, Bld: 170 mg/dL — ABNORMAL HIGH (ref 70–99)
Glucose, Bld: 174 mg/dL — ABNORMAL HIGH (ref 70–99)
Potassium: 4.5 mmol/L (ref 3.5–5.1)
Potassium: 4.2 mmol/L (ref 3.5–5.1)
Sodium: 132 mmol/L — ABNORMAL LOW (ref 135–145)
Sodium: 132 mmol/L — ABNORMAL LOW (ref 135–145)

## 2022-05-27 LAB — RENAL FUNCTION PANEL
Albumin: 2.4 g/dL — ABNORMAL LOW (ref 3.5–5.0)
Anion gap: 7 (ref 5–15)
BUN: 55 mg/dL — ABNORMAL HIGH (ref 8–23)
CO2: 18 mmol/L — ABNORMAL LOW (ref 22–32)
Calcium: 8.6 mg/dL — ABNORMAL LOW (ref 8.9–10.3)
Chloride: 107 mmol/L (ref 98–111)
Creatinine, Ser: 2.7 mg/dL — ABNORMAL HIGH (ref 0.44–1.00)
GFR, Estimated: 19 mL/min — ABNORMAL LOW (ref >60)
Glucose, Bld: 179 mg/dL — ABNORMAL HIGH (ref 70–99)
Phosphorus: 3.7 mg/dL (ref 2.5–4.6)
Potassium: 4.4 mmol/L (ref 3.5–5.1)
Sodium: 132 mmol/L — ABNORMAL LOW (ref 135–145)

## 2022-05-27 LAB — GLUCOSE, CAPILLARY
Glucose-Capillary: 116 mg/dL — ABNORMAL HIGH (ref 70–99)
Glucose-Capillary: 140 mg/dL — ABNORMAL HIGH (ref 70–99)
Glucose-Capillary: 141 mg/dL — ABNORMAL HIGH (ref 70–99)
Glucose-Capillary: 153 mg/dL — ABNORMAL HIGH (ref 70–99)
Glucose-Capillary: 160 mg/dL — ABNORMAL HIGH (ref 70–99)
Glucose-Capillary: 168 mg/dL — ABNORMAL HIGH (ref 70–99)
Glucose-Capillary: 172 mg/dL — ABNORMAL HIGH (ref 70–99)

## 2022-05-27 LAB — OSMOLALITY: Osmolality: 307 mOsm/kg — ABNORMAL HIGH (ref 275–295)

## 2022-05-27 LAB — LIPASE, BLOOD: Lipase: 131 U/L — ABNORMAL HIGH (ref 11–51)

## 2022-05-27 MED ORDER — GABAPENTIN 300 MG PO CAPS: 300.0000 mg | ORAL_CAPSULE | Freq: Every day | ORAL | Status: DC

## 2022-05-27 MED ORDER — INSULIN GLARGINE-YFGN 100 UNIT/ML ~~LOC~~ SOLN
10.0000 [IU] | Freq: Every day | SUBCUTANEOUS | Status: DC
  Administered 2022-05-27: 10 [IU] via SUBCUTANEOUS
  Filled 2022-05-27 (×2): qty 0.1

## 2022-05-27 MED ORDER — SODIUM BICARBONATE 650 MG PO TABS
1300.0000 mg | ORAL_TABLET | Freq: Once | ORAL | Status: AC
  Administered 2022-05-27: 1300 mg via ORAL
  Filled 2022-05-27: qty 2

## 2022-05-27 MED ORDER — INSULIN ASPART 100 UNIT/ML IJ SOLN
6.0000 [IU] | Freq: Once | INTRAMUSCULAR | Status: AC
Start: 2022-05-27 — End: 2022-05-27
  Administered 2022-05-27: 6 [IU] via SUBCUTANEOUS

## 2022-05-27 MED ORDER — INSULIN ASPART 100 UNIT/ML FLEXPEN: 0.0000 [IU] | PEN_INJECTOR | Freq: Three times a day (TID) | SUBCUTANEOUS | 11 refills | Status: DC

## 2022-05-27 MED ORDER — SODIUM BICARBONATE 650 MG PO TABS: 1300.0000 mg | ORAL_TABLET | Freq: Two times a day (BID) | ORAL | 11 refills | Status: DC

## 2022-05-27 MED ORDER — METOPROLOL SUCCINATE ER 100 MG PO TB24: 100.0000 mg | ORAL_TABLET | Freq: Every day | ORAL | 3 refills | Status: DC

## 2022-05-27 MED ORDER — POTASSIUM CHLORIDE CRYS ER 20 MEQ PO TBCR: 20.0000 meq | EXTENDED_RELEASE_TABLET | ORAL | 3 refills | Status: DC

## 2022-05-27 NOTE — Discharge Summary (Signed)
Heather [name], is a 71 y.o. female  DOB May 06, 1951  MRN [id number].  Admission date:  05/26/2022  Admitting Physician  Truett Mainland, DO  Discharge Date:  05/27/2022   Primary MD  [name], MD  Recommendations for primary care physician for things to follow:   1)Avoid ibuprofen/Advil/Aleve/Motrin/Goody Powders/Naproxen/BC powders/Meloxicam/Diclofenac/Indomethacin and other Nonsteroidal anti-inflammatory medications as these will make you more likely to bleed and can cause stomach ulcers, can also cause Kidney problems.   2)Sliding scale insulin with NovoLog :- insulin aspart (novoLOG) injection 0-12 Units 0-12 Units Subcutaneous, 3 times daily with meals CBG < 70: Implement Hypoglycemia Standing Orders and refer to Hypoglycemia Standing Orders sidebar report  CBG 70 - 120: 0 unit CBG 121 - 150: 0 unit  CBG 151 - 200: 2 unit CBG 201 - 250: 4 units CBG 251 - 300: 6 units CBG 301 - 350: 8 units  CBG 351 - 400: 10 units  CBG > 400: 12 units  3)follow up with PCP within a week for recheck and reevaluation and repeat BMP blood test  4) please increase your bicarb to 1300 mg twice daily due to persistent metabolic acidosis in the setting of chronic kidney disease  5)Please avoid taking more than 300 mg of gabapentin/Neurontin per day due to your kidney problems  Admission Diagnosis  Hyperglycemia [R73.9] Acute pancreatitis, unspecified complication status, unspecified pancreatitis type [K85.90] Acute renal failure superimposed on chronic kidney disease, unspecified CKD stage, unspecified acute renal failure type (HCC) [N17.9, N18.9] Type 2 diabetes mellitus with hyperosmolar hyperglycemic state (HHS) ([place]) [E11.00] Type 2 diabetes mellitus with hyperosmolar nonketotic hyperglycemia (HCC) [E11.00]   Discharge Diagnosis  Hyperglycemia [R73.9] Acute pancreatitis, unspecified complication status,  unspecified pancreatitis type [K85.90] Acute renal failure superimposed on chronic kidney disease, unspecified CKD stage, unspecified acute renal failure type (HCC) [N17.9, N18.9] Type 2 diabetes mellitus with hyperosmolar hyperglycemic state (HHS) ([place]) [E11.00] Type 2 diabetes mellitus with hyperosmolar nonketotic hyperglycemia (HCC) [E11.00]    Principal Problem:   Type 2 diabetes mellitus with hyperosmolar hyperglycemic state (HHS) ([place]) Active Problems:   Acute renal failure superimposed on stage 3b chronic kidney disease ([place])   Essential hypertension   Acute pancreatitis   Type 2 diabetes mellitus with hyperosmolar nonketotic hyperglycemia (HCC)      Past Medical History:  Diagnosis Date   Acute renal failure superimposed on stage 3 chronic kidney disease ([place]) 01/21/2016   Anemia    Anxiety    B12 deficiency    Brain tumor (benign) ([place]) 10/29/2020   Cellulitis and abscess    Abdomen and buttocks   Chronic diastolic CHF (congestive heart failure) ([place]) 03/16/2022   CKD (chronic kidney disease) stage 3, GFR 30-59 ml/min (HCC)    Closed fracture of distal end of femur, unspecified fracture morphology, initial encounter ([place])    Coronary arteriosclerosis 07/15/2018   Crohn disease ([place])    Depression    Diabetic neuropathy (HCC)    feet   DJD (degenerative joint disease)    Right forminal  stenosis C4-5   Essential hypertension    Femur fracture, right ([place]) 63/89/3734   Folliculitis    GERD (gastroesophageal reflux disease)    Gout    Headache(784.0)    Hiatal hernia    History of blood transfusion    History of bronchitis    History of cardiac catheterization    No significant CAD 2012   History of kidney stones    surgery to remove   Insomnia    Irritable bowel syndrome    Mixed hyperlipidemia    Osteoarthritis    Palpitations    Pneumonia    Reflux esophagitis    Salmonella    Stroke [facility])    Tinnitus    Right   Type 2 diabetes mellitus [facility])      Past Surgical History:  Procedure Laterality Date   BACK SURGERY     c-spine surgery     03/2007   CARDIAC CATHETERIZATION     CHOLECYSTECTOMY     COLONOSCOPY  04/21/2011; 05/29/11   6/12: [place] - ?AVM at IC valve, inflammatory changes at IC valve [facility]); 7/12 - [place]; IC valve erosions, look chronic and probably Crohn's per path   colonoscopy  2017   [place]: random biopsies normal. TI normal   ESOPHAGOGASTRODUODENOSCOPY  05/29/11   normal   ESOPHAGOGASTRODUODENOSCOPY N/A 01/21/2016   Dr. Michail Sermon: normal   EYE SURGERY Bilateral    cataracts removed   FEMUR IM NAIL Right 04/10/2018   Procedure: INTRAMEDULLARY (IM) NAIL FEMORAL;  Surgeon: Paralee Cancel, MD;  Location: [facility];  Service: Orthopedics;  Laterality: Right;   FRACTURE SURGERY     GIVENS CAPSULE STUDY  11/17/2020   HARDWARE REMOVAL Right 04/10/2018   Procedure: HARDWARE REMOVAL RIGHT DISTAL FEMUR;  Surgeon: Paralee Cancel, MD;  Location: [facility];  Service: Orthopedics;  Laterality: Right;   HERNIA REPAIR     IR FLUORO GUIDE CV LINE RIGHT  04/01/2017   IR US GUIDE VASC ACCESS RIGHT  04/01/2017   JOINT REPLACEMENT Right    hip   OPEN REDUCTION INTERNAL FIXATION (ORIF) DISTAL RADIAL FRACTURE Right 05/28/2019   Procedure: OPEN REDUCTION INTERNAL FIXATION (ORIF) DISTAL RADIAL FRACTURE;  Surgeon: Verner Mould, MD;  Location: [place];  Service: Orthopedics;  Laterality: Right;  with block 90 minutes   ORIF FEMUR FRACTURE Right 03/31/2017   Procedure: OPEN REDUCTION INTERNAL FIXATION (ORIF) DISTAL FEMUR FRACTURE;  Surgeon: Paralee Cancel, MD;  Location: [place];  Service: Orthopedics;  Laterality: Right;   removal of kidney stone     Right knee arthroscopic surgery     TONSILLECTOMY     TOTAL ABDOMINAL HYSTERECTOMY       HPI  from the history and physical done on the day of admission:    HPI: Heather [name] is a 71 y.o. female with medical history significant of stage III chronic kidney disease,  insulin-dependent and poorly controlled diabetes, hypertension, depression, benzodiazepine dependent anxiety, coronary artery disease history of stroke.  Patient has been having increased abdominal pain since the beginning of the week.  Her pain starts in her back and radiates to her left upper quadrant.  The pain is sharp, worse after eating and with movement.  It is improved with lying still.  She has noticed over the past couple of weeks that her blood sugars have not been well controlled.     The patient had some blood work done by the nephrologist last week.  It was noted that her  lab work was abnormal.  The nephrologist called her today and had her come in today for blood work.  After receiving the results, the patient was instructed to come to the emergency department for evaluation   Review of Systems: As mentioned in the history of present illness. All other systems reviewed and are negative.     Hospital Course:    Assessment and Plan: 1)HHS--- patient was admitted with hyperosmolar hyperglycemic state -Received IV fluids and IV insulin -Transition to subcu insulin -Patient admits to noncompliance with insulin regimen prior to admission -  2)Aki on CKD IV--with chronic metabolic acidosis...  -With IV fluids bicarb improved to 18 from 13 -Creatinine back to 2.7 range... Which is close to prior baseline Admission creatinine was around 3.45  -Discharge on bicarb tablets  3) acute pancreatitis--- tolerating oral intake well, no abdominal pain no nausea no vomiting - 4)HTN--resume PTA meds  5)HLD--- continue statin  6) anxiety disorder--- stable continue PTA meds   Discharge Condition: stable  Follow UP   Follow-up Information     [name], MD. Schedule an appointment as soon as possible for a visit in 1 week(s).   Specialty: Internal Medicine Contact information: [address]  [place] [address] [phone]                Diet and Activity  recommendation:  As advised  Discharge Instructions   Discharge Instructions     Call MD for:  difficulty breathing, headache or visual disturbances   Complete by: As directed    Call MD for:  persistant dizziness or light-headedness   Complete by: As directed    Call MD for:  persistant nausea and vomiting   Complete by: As directed    Call MD for:  temperature >100.4   Complete by: As directed    Diet - low sodium heart healthy   Complete by: As directed    Diet Carb Modified   Complete by: As directed    Discharge instructions   Complete by: As directed    1)Avoid ibuprofen/Advil/Aleve/Motrin/Goody Powders/Naproxen/BC powders/Meloxicam/Diclofenac/Indomethacin and other Nonsteroidal anti-inflammatory medications as these will make you more likely to bleed and can cause stomach ulcers, can also cause Kidney problems.   2)Sliding scale insulin with NovoLog :- insulin aspart (novoLOG) injection 0-12 Units 0-12 Units Subcutaneous, 3 times daily with meals CBG < 70: Implement Hypoglycemia Standing Orders and refer to Hypoglycemia Standing Orders sidebar report  CBG 70 - 120: 0 unit CBG 121 - 150: 0 unit  CBG 151 - 200: 2 unit CBG 201 - 250: 4 units CBG 251 - 300: 6 units CBG 301 - 350: 8 units  CBG 351 - 400: 10 units  CBG > 400: 12 units  3)follow up with PCP within a week for recheck and reevaluation and repeat BMP blood test  4) please increase your bicarb to 1300 mg twice daily due to persistent metabolic acidosis in the setting of chronic kidney disease  5)Please avoid taking more than 300 mg of gabapentin/Neurontin per day due to your kidney problems   Increase activity slowly   Complete by: As directed    No wound care   Complete by: As directed          Discharge Medications     Allergies as of 05/27/2022       Reactions   Cozaar [losartan] Nausea And Vomiting, Swelling, Rash   Quinolones Itching, Nausea And Vomiting, Nausea Only, Swelling   Swelling of face,  jaw, and  lips   Cymbalta [duloxetine Hcl] Itching, Swelling   Sulfa Antibiotics Nausea And Vomiting, Swelling   Headache  Swelling of feet, legs   Synalar [fluocinolone] Swelling   Cipro [ciprofloxacin Hcl] Nausea And Vomiting, Swelling   Swelling of face, jaw, lips   Diovan [valsartan] Nausea And Vomiting, Swelling   Glucophage [metformin] Other (See Comments)   GI upset   Ketalar [ketamine] Swelling   Norco [hydrocodone-acetaminophen] Itching, Swelling   Norvasc [amlodipine Besylate] Itching, Swelling   Fluid retention    Nsaids Nausea And Vomiting, Other (See Comments)   Has bleeding ulcers   Tylenol [acetaminophen] Itching, Swelling   Tape Itching, Rash   Paper tape can only be used on this patient   Zestril [lisinopril] Swelling, Rash   Oral swelling Red streaks on arms/legs        Medication List     STOP taking these medications    insulin lispro 100 UNIT/ML injection Commonly known as: HUMALOG   Lantus SoloStar 100 UNIT/ML Solostar Pen Generic drug: insulin glargine   metoprolol tartrate 100 MG tablet Commonly known as: LOPRESSOR       TAKE these medications    acetaminophen 325 MG tablet Commonly known as: TYLENOL Take 2 tablets (650 mg total) by mouth every 4 (four) hours.   allopurinol 100 MG tablet Commonly known as: ZYLOPRIM Take 100 mg by mouth daily.   atorvastatin 80 MG tablet Commonly known as: LIPITOR Take 80 mg by mouth daily.   busPIRone 10 MG tablet Commonly known as: BUSPAR Take 10 mg by mouth 3 (three) times daily as needed for anxiety.   calcitRIOL 0.25 MCG capsule Commonly known as: ROCALTROL Take 0.25 mcg by mouth 3 (three) times a week. Monday,Wed and Fri.   calcium carbonate 1250 (500 Ca) MG chewable tablet Commonly known as: OS-CAL Chew 1 tablet by mouth 2 (two) times daily.   cyanocobalamin 1000 MCG/ML injection Commonly known as: VITAMIN B12 Inject 1,000 mcg into the muscle every 30 (thirty) days.   diazepam 5 MG  tablet Commonly known as: VALIUM Take 1 tablet (5 mg total) by mouth every 12 (twelve) hours as needed for anxiety.   dicyclomine 20 MG tablet Commonly known as: BENTYL Take 1 tablet (20 mg total) by mouth in the morning and at bedtime.   diphenhydrAMINE 2 % cream Commonly known as: BENADRYL Apply topically 3 (three) times daily as needed for itching.   esomeprazole 40 MG capsule Commonly known as: NEXIUM Take 40 mg by mouth daily before breakfast.   furosemide 40 MG tablet Commonly known as: LASIX Take 40 mg by mouth 2 (two) times daily.   gabapentin 300 MG capsule Commonly known as: NEURONTIN Take 1 capsule (300 mg total) by mouth at bedtime. What changed: when to take this   hydrALAZINE 25 MG tablet Commonly known as: APRESOLINE Take 25 mg by mouth in the morning and at bedtime.   insulin aspart 100 UNIT/ML FlexPen Commonly known as: NOVOLOG Inject 0-12 Units into the skin 3 (three) times daily with meals. insulin aspart (novoLOG) injection 0-12 Units 0-12 Units Subcutaneous, 3 times daily with meals CBG < 70: Implement Hypoglycemia Standing Orders and refer to Hypoglycemia Standing Orders sidebar report  CBG 70 - 120: 0 unit CBG 121 - 150: 0 unit  CBG 151 - 200: 2 unit CBG 201 - 250: 4 units CBG 251 - 300: 6 units CBG 301 - 350: 8 units  CBG 351 - 400: 10 units  CBG >  400: 12 units   Levemir FlexPen 100 UNIT/ML FlexPen Generic drug: insulin detemir Inject 10 Units into the skin at bedtime.   levETIRAcetam 500 MG tablet Commonly known as: KEPPRA Take 500 mg by mouth 2 (two) times daily.   metoprolol succinate 100 MG 24 hr tablet Commonly known as: Toprol XL Take 1 tablet (100 mg total) by mouth daily. Take with or immediately following a meal.   polyethylene glycol 17 g packet Commonly known as: MIRALAX / GLYCOLAX Take 17 g by mouth daily. What changed:  when to take this reasons to take this   potassium chloride SA 20 MEQ tablet Commonly known as: KLOR-CON  M Take 1 tablet (20 mEq total) by mouth every Monday, Wednesday, and Friday. Take while taking Lasix/Furosemide Start taking on: May 28, 2022 What changed:  when to take this additional instructions   sodium bicarbonate 650 MG tablet Take 2 tablets (1,300 mg total) by mouth 2 (two) times daily. For kidneys What changed:  how much to take when to take this additional instructions   vitamin C with rose hips 500 MG tablet Take 500 mg by mouth every morning.   Vitamin D 50 MCG (2000 UT) Caps Take 2,000 Units by mouth every morning.        Major procedures and Radiology Reports - PLEASE review detailed and final reports for all details, in brief -   CT ABDOMEN PELVIS WO CONTRAST  Result Date: 05/26/2022 CLINICAL DATA:  Left lower quadrant abdominal pain. Nausea and vomiting. EXAM: CT ABDOMEN AND PELVIS WITHOUT CONTRAST TECHNIQUE: Multidetector CT imaging of the abdomen and pelvis was performed following the standard protocol without IV contrast. RADIATION DOSE REDUCTION: This exam was performed according to the departmental dose-optimization program which includes automated exposure control, adjustment of the mA and/or kV according to patient size and/or use of iterative reconstruction technique. COMPARISON:  CT abdomen pelvis 01/07/2021. FINDINGS: Lower chest: Subtle scattered centrilobular ground-glass opacities in the visualized lower lobes and right middle lobe. No pleural effusion. A 0.5 cm solid nodule in the left lung base is stable compared to 03/02/2020, consistent with benignity (series 4, image 26). Hepatobiliary: No focal liver abnormality is seen. Status post cholecystectomy. No biliary dilatation. Pancreas: Subtle peripancreatic fat stranding is noted along the tail of the pancreas. No peripancreatic fluid collection. The pancreas does not appear enlarged. Spleen: Normal in size. Numerous punctate calcifications may reflect calcified granulomas versus vascular calcifications.  Adrenals/Urinary Tract: Adrenal glands are unremarkable. A 0.8 cm exophytic lesion at the posterior aspect of the middle third of the left kidney demonstrates attenuation of -23 Hounsfield units, consistent with a fat containing mass (series 2, image 36). No associated calcifications. The mass is stable compared to CT dated 02/05/2020 and is consistent with a stable angiomyolipoma; no additional dedicated follow-up imaging is indicated. A punctate nonobstructive calyceal stone is noted in the upper pole of the right kidney (series 6, image 55). Urinary bladder is minimally filled and unremarkable given degree of distention. Stomach/Bowel: Stomach is within normal limits. The appendix is not directly visualized, however there are no pericecal inflammatory changes. No evidence of bowel wall thickening, distention, or inflammatory changes. Vascular/Lymphatic: Aortic atherosclerosis. Severe calcific atherosclerosis at the origins of the renal artery and throughout the splenic artery. No enlarged abdominal or pelvic lymph nodes. Reproductive: Status post hysterectomy. A 1.4 cm benign right ovarian cyst is noted; no additional dedicated follow-up imaging is indicated. Other: No abdominal wall hernia or abnormality. No abdominopelvic ascites. Musculoskeletal: Compression  fracture of the T12 vertebral body with approximately 50% vertebral height loss, new compared to chest radiograph 10/18/2021. No suspicious osseous findings. Bilateral pedicle screw and rod fixation spans L1-L4 with intervertebral spacers at L2-L3 and L3-L4. IMPRESSION: 1. Mild fat stranding surrounding the tail of the pancreas, suggestive of acute pancreatitis. No peripancreatic fluid collection. Recommend correlation with lipase levels. 2. Compression fracture of the T12 vertebral body, with approximately 50% vertebral body height loss, of unknown chronicity. This finding is new since prior chest radiograph 10/18/2021. 3. Nonobstructive right  nephrolithiasis. 4. Subtle centrilobular ground-glass opacities in the visualized lower lungs may reflect bronchiolitis or hypersensitivity pneumonitis. 5.  Aortic Atherosclerosis (ICD10-I70.0). Electronically Signed   By: Ileana Roup M.D.   On: 05/26/2022 15:07    Micro Results  Recent Results (from the past 240 hour(s))  MRSA Next Gen by PCR, Nasal     Status: Abnormal   Collection Time: 05/26/22  5:55 PM   Specimen: Nasal Mucosa; Nasal Swab  Result Value Ref Range Status   MRSA by PCR Next Gen POSITIVE (A) NOT DETECTED Final    Comment: RESULT CALLED TO, READ BACK BY AND VERIFIED WITH: ESTOCE,R ON 05/26/2022 @ 2027 BY ACOSTA,A        The GeneXpert MRSA Assay (FDA approved for NASAL specimens only), is one component of a comprehensive MRSA colonization surveillance program. It is not intended to diagnose MRSA infection nor to guide or monitor treatment for MRSA infections. Performed at [facility], [address] [place], [place] [address]     Today   Subjective    Casara Perrier today has no new complaints No fever  Or chills  No fever  Or chills  -Eating and drinking well          Patient has been seen and examined prior to discharge   Objective   Blood pressure (!) 146/53, pulse 91, temperature 97.9 F (36.6 C), temperature source Oral, resp. rate 13, height '5\' 2"'$  (1.575 m), weight 57 kg, SpO2 100 %.   Intake/Output Summary (Last 24 hours) at 05/27/2022 1219 Last data filed at 05/27/2022 0717 Gross per 24 hour  Intake 4036.96 ml  Output 1050 ml  Net 2986.96 ml    Exam Gen:- Awake Alert, no acute distress  HEENT:- [place].AT, No sclera icterus Neck-Supple Neck,No JVD,.  Lungs-  CTAB , good air movement bilaterally CV- S1, S2 normal, regular Abd-  +ve B.Sounds, Abd Soft, No tenderness,    Extremity/Skin:- No  edema,   good pulses Psych-affect is appropriate, oriented x3 Neuro-no new focal deficits, no tremors    Data Review   CBC w Diff:  Lab Results   Component Value Date   WBC 11.7 (H) 05/26/2022   HGB 11.3 (L) 05/26/2022   HCT 33.8 (L) 05/26/2022   PLT 288 05/26/2022   LYMPHOPCT 15 05/26/2022   MONOPCT 6 05/26/2022   EOSPCT 0 05/26/2022   BASOPCT 0 05/26/2022    CMP:  Lab Results  Component Value Date   NA 132 (L) 05/27/2022   NA 143 06/17/2017   K 4.4 05/27/2022   CL 107 05/27/2022   CO2 18 (L) 05/27/2022   BUN 55 (H) 05/27/2022   BUN 34 (H) 06/17/2017   CREATININE 2.70 (H) 05/27/2022   PROT 7.4 05/26/2022   PROT 7.0 06/17/2017   ALBUMIN 2.4 (L) 05/27/2022   ALBUMIN 4.4 06/17/2017   BILITOT 0.7 05/26/2022   BILITOT 0.3 06/17/2017   ALKPHOS 111 05/26/2022   AST 26 05/26/2022  ALT 21 05/26/2022  .  Total Discharge time is about 33 minutes  Roxan Hockey M.D on 05/27/2022 at 12:19 PM  Go to [email or web address] -  for contact info  [facility] - Office  [phone]

## 2022-05-27 NOTE — Progress Notes (Signed)
Discharged to home. IV access removed. Patient dressed herself. Discharge paperwork given and rolled to car by wheelchair.

## 2022-05-27 NOTE — Care Management CC44 (Signed)
Condition Code 44 Documentation Completed  Patient Details  Name: SILVANA HOLECEK MRN: [id number] Date of Birth: [birth year]   Condition Code 44 given:  Yes Patient signature on Condition Code 44 notice:  Yes Documentation of 2 MD's agreement:  Yes Code 44 added to claim:  Yes    Archie Endo, LCSW 05/27/2022, 9:08 AM

## 2022-05-27 NOTE — Care Management Obs Status (Signed)
MEDICARE OBSERVATION STATUS NOTIFICATION   Patient Details  Name: Heather [name] MRN: [id number] Date of Birth: 09-11-1951   Medicare Observation Status Notification Given:  Yes    Archie Endo, LCSW 05/27/2022, 9:08 AM

## 2022-05-27 NOTE — Discharge Instructions (Signed)
1)Avoid ibuprofen/Advil/Aleve/Motrin/Goody Powders/Naproxen/BC powders/Meloxicam/Diclofenac/Indomethacin and other Nonsteroidal anti-inflammatory medications as these will make you more likely to bleed and can cause stomach ulcers, can also cause Kidney problems.   2)Sliding scale insulin with NovoLog :- insulin aspart (novoLOG) injection 0-12 Units 0-12 Units Subcutaneous, 3 times daily with meals CBG < 70: Implement Hypoglycemia Standing Orders and refer to Hypoglycemia Standing Orders sidebar report  CBG 70 - 120: 0 unit CBG 121 - 150: 0 unit  CBG 151 - 200: 2 unit CBG 201 - 250: 4 units CBG 251 - 300: 6 units CBG 301 - 350: 8 units  CBG 351 - 400: 10 units  CBG > 400: 12 units  3)follow up with PCP within a week for recheck and reevaluation and repeat BMP blood test  4) please increase your bicarb to 1300 mg twice daily due to persistent metabolic acidosis in the setting of chronic kidney disease  5)Please avoid taking more than 300 mg of gabapentin/Neurontin per day due to your kidney problems

## 2022-05-27 NOTE — Progress Notes (Signed)
CSW spoke with patient to deliver Code 44 message.  Madilyn Fireman, MSW, LCSW Transitions of Care  Clinical Social Worker II [phone]

## 2022-05-29 LAB — URINE CULTURE: Culture: NO GROWTH

## 2022-05-31 ENCOUNTER — Encounter (HOSPITAL_COMMUNITY): Admission: RE | Admit: 2022-05-31 | Payer: Medicare HMO | Source: Ambulatory Visit

## 2022-05-31 DIAGNOSIS — N184 Chronic kidney disease, stage 4 (severe): Secondary | ICD-10-CM

## 2022-06-05 ENCOUNTER — Encounter (HOSPITAL_COMMUNITY)
Admission: RE | Admit: 2022-06-05 | Discharge: 2022-06-05 | Disposition: A | Discharge status: Home / Self Care | Payer: Medicare HMO | Source: Ambulatory Visit | Attending: Nephrology | Admitting: Nephrology

## 2022-06-05 ENCOUNTER — Encounter: Payer: Medicare HMO | Admitting: Nurse Practitioner

## 2022-06-05 VITALS — Ht 62.0 in

## 2022-06-05 DIAGNOSIS — N184 Chronic kidney disease, stage 4 (severe): Secondary | ICD-10-CM | POA: Insufficient documentation

## 2022-06-05 DIAGNOSIS — N179 Acute kidney failure, unspecified: Secondary | ICD-10-CM | POA: Insufficient documentation

## 2022-06-05 DIAGNOSIS — N1832 Chronic kidney disease, stage 3b: Secondary | ICD-10-CM | POA: Diagnosis not present

## 2022-06-05 LAB — POCT HEMOGLOBIN-HEMACUE: Hemoglobin: 10.1 g/dL — ABNORMAL LOW (ref 12.0–15.0)

## 2022-06-05 MED ORDER — EPOETIN ALFA-EPBX 3000 UNIT/ML IJ SOLN: 3000.0000 [IU] | Freq: Once | INTRAMUSCULAR | Status: DC

## 2022-06-05 NOTE — Progress Notes (Signed)
Erroneous encounter

## 2022-06-06 ENCOUNTER — Other Ambulatory Visit (HOSPITAL_COMMUNITY)
Admission: RE | Admit: 2022-06-06 | Discharge: 2022-06-06 | Disposition: A | Discharge status: Home / Self Care | Payer: Medicare HMO | Source: Ambulatory Visit | Attending: Nephrology | Admitting: Nephrology

## 2022-06-06 DIAGNOSIS — N170 Acute kidney failure with tubular necrosis: Secondary | ICD-10-CM | POA: Diagnosis not present

## 2022-06-06 DIAGNOSIS — N189 Chronic kidney disease, unspecified: Secondary | ICD-10-CM | POA: Insufficient documentation

## 2022-06-06 DIAGNOSIS — E871 Hypo-osmolality and hyponatremia: Secondary | ICD-10-CM | POA: Diagnosis not present

## 2022-06-06 DIAGNOSIS — R808 Other proteinuria: Secondary | ICD-10-CM | POA: Diagnosis not present

## 2022-06-06 DIAGNOSIS — E8722 Chronic metabolic acidosis: Secondary | ICD-10-CM | POA: Diagnosis not present

## 2022-06-06 DIAGNOSIS — D631 Anemia in chronic kidney disease: Secondary | ICD-10-CM | POA: Diagnosis not present

## 2022-06-06 DIAGNOSIS — R112 Nausea with vomiting, unspecified: Secondary | ICD-10-CM | POA: Diagnosis not present

## 2022-06-06 DIAGNOSIS — E875 Hyperkalemia: Secondary | ICD-10-CM | POA: Diagnosis not present

## 2022-06-06 LAB — CBC
HCT: 31.6 % — ABNORMAL LOW (ref 36.0–46.0)
Hemoglobin: 10.5 g/dL — ABNORMAL LOW (ref 12.0–15.0)
MCH: 31.4 pg (ref 26.0–34.0)
MCHC: 33.2 g/dL (ref 30.0–36.0)
MCV: 94.6 fL (ref 80.0–100.0)
Platelets: 345 10*3/uL (ref 150–400)
RBC: 3.34 MIL/uL — ABNORMAL LOW (ref 3.87–5.11)
RDW: 14.3 % (ref 11.5–15.5)
WBC: 10.4 10*3/uL (ref 4.0–10.5)
nRBC: 0 % (ref 0.0–0.2)

## 2022-06-06 LAB — RENAL FUNCTION PANEL
Albumin: 3 g/dL — ABNORMAL LOW (ref 3.5–5.0)
Anion gap: 10 (ref 5–15)
BUN: 39 mg/dL — ABNORMAL HIGH (ref 8–23)
CO2: 20 mmol/L — ABNORMAL LOW (ref 22–32)
Calcium: 9.4 mg/dL (ref 8.9–10.3)
Chloride: 103 mmol/L (ref 98–111)
Creatinine, Ser: 2.35 mg/dL — ABNORMAL HIGH (ref 0.44–1.00)
GFR, Estimated: 22 mL/min — ABNORMAL LOW (ref >60)
Glucose, Bld: 344 mg/dL — ABNORMAL HIGH (ref 70–99)
Phosphorus: 4.8 mg/dL — ABNORMAL HIGH (ref 2.5–4.6)
Potassium: 5.4 mmol/L — ABNORMAL HIGH (ref 3.5–5.1)
Sodium: 133 mmol/L — ABNORMAL LOW (ref 135–145)

## 2022-06-06 LAB — PROTEIN / CREATININE RATIO, URINE
Creatinine, Urine: 31.6 mg/dL
Protein Creatinine Ratio: 4.68 mg/mg{Cre} — ABNORMAL HIGH (ref 0.00–0.15)
Total Protein, Urine: 148 mg/dL

## 2022-06-15 DIAGNOSIS — Z0130 Encounter for examination of blood pressure without abnormal findings: Secondary | ICD-10-CM | POA: Diagnosis not present

## 2022-06-15 DIAGNOSIS — E1122 Type 2 diabetes mellitus with diabetic chronic kidney disease: Secondary | ICD-10-CM | POA: Diagnosis not present

## 2022-06-15 DIAGNOSIS — E7800 Pure hypercholesterolemia, unspecified: Secondary | ICD-10-CM | POA: Diagnosis not present

## 2022-06-15 DIAGNOSIS — Z79899 Other long term (current) drug therapy: Secondary | ICD-10-CM | POA: Diagnosis not present

## 2022-06-15 DIAGNOSIS — R030 Elevated blood-pressure reading, without diagnosis of hypertension: Secondary | ICD-10-CM | POA: Diagnosis not present

## 2022-06-15 DIAGNOSIS — E119 Type 2 diabetes mellitus without complications: Secondary | ICD-10-CM | POA: Diagnosis not present

## 2022-06-15 DIAGNOSIS — D329 Benign neoplasm of meninges, unspecified: Secondary | ICD-10-CM | POA: Diagnosis not present

## 2022-06-15 DIAGNOSIS — N184 Chronic kidney disease, stage 4 (severe): Secondary | ICD-10-CM | POA: Diagnosis not present

## 2022-06-15 DIAGNOSIS — Z6823 Body mass index (BMI) 23.0-23.9, adult: Secondary | ICD-10-CM | POA: Diagnosis not present

## 2022-06-15 DIAGNOSIS — I10 Essential (primary) hypertension: Secondary | ICD-10-CM | POA: Diagnosis not present

## 2022-06-19 ENCOUNTER — Encounter (HOSPITAL_COMMUNITY)
Admission: RE | Admit: 2022-06-19 | Discharge: 2022-06-19 | Disposition: A | Discharge status: Home / Self Care | Payer: Medicare HMO | Source: Ambulatory Visit | Attending: Nephrology | Admitting: Nephrology

## 2022-06-19 ENCOUNTER — Inpatient Hospital Stay (HOSPITAL_COMMUNITY)
Admission: EM | Admit: 2022-06-19 | Discharge: 2022-06-22 | DRG: 439 | Disposition: A | Discharge status: Home Health | Payer: Medicare HMO | Attending: Internal Medicine | Admitting: Internal Medicine

## 2022-06-19 ENCOUNTER — Emergency Department (HOSPITAL_COMMUNITY): Payer: Medicare HMO

## 2022-06-19 ENCOUNTER — Encounter (HOSPITAL_COMMUNITY): Payer: Self-pay

## 2022-06-19 ENCOUNTER — Other Ambulatory Visit: Payer: Self-pay

## 2022-06-19 VITALS — BP 111/66 | HR 98 | Resp 18

## 2022-06-19 DIAGNOSIS — I130 Hypertensive heart and chronic kidney disease with heart failure and stage 1 through stage 4 chronic kidney disease, or unspecified chronic kidney disease: Secondary | ICD-10-CM | POA: Diagnosis not present

## 2022-06-19 DIAGNOSIS — Z794 Long term (current) use of insulin: Secondary | ICD-10-CM

## 2022-06-19 DIAGNOSIS — R109 Unspecified abdominal pain: Secondary | ICD-10-CM | POA: Diagnosis not present

## 2022-06-19 DIAGNOSIS — K5090 Crohn's disease, unspecified, without complications: Secondary | ICD-10-CM | POA: Diagnosis present

## 2022-06-19 DIAGNOSIS — Z881 Allergy status to other antibiotic agents status: Secondary | ICD-10-CM

## 2022-06-19 DIAGNOSIS — R945 Abnormal results of liver function studies: Secondary | ICD-10-CM | POA: Diagnosis not present

## 2022-06-19 DIAGNOSIS — Z6821 Body mass index (BMI) 21.0-21.9, adult: Secondary | ICD-10-CM | POA: Diagnosis not present

## 2022-06-19 DIAGNOSIS — R531 Weakness: Secondary | ICD-10-CM | POA: Diagnosis not present

## 2022-06-19 DIAGNOSIS — N171 Acute kidney failure with acute cortical necrosis: Secondary | ICD-10-CM | POA: Diagnosis not present

## 2022-06-19 DIAGNOSIS — B9620 Unspecified Escherichia coli [E. coli] as the cause of diseases classified elsewhere: Secondary | ICD-10-CM | POA: Diagnosis present

## 2022-06-19 DIAGNOSIS — N2581 Secondary hyperparathyroidism of renal origin: Secondary | ICD-10-CM | POA: Diagnosis not present

## 2022-06-19 DIAGNOSIS — Z888 Allergy status to other drugs, medicaments and biological substances status: Secondary | ICD-10-CM

## 2022-06-19 DIAGNOSIS — E1169 Type 2 diabetes mellitus with other specified complication: Secondary | ICD-10-CM | POA: Diagnosis present

## 2022-06-19 DIAGNOSIS — Z885 Allergy status to narcotic agent status: Secondary | ICD-10-CM

## 2022-06-19 DIAGNOSIS — Z8673 Personal history of transient ischemic attack (TIA), and cerebral infarction without residual deficits: Secondary | ICD-10-CM

## 2022-06-19 DIAGNOSIS — N179 Acute kidney failure, unspecified: Secondary | ICD-10-CM | POA: Diagnosis present

## 2022-06-19 DIAGNOSIS — E1165 Type 2 diabetes mellitus with hyperglycemia: Secondary | ICD-10-CM | POA: Diagnosis not present

## 2022-06-19 DIAGNOSIS — R296 Repeated falls: Secondary | ICD-10-CM | POA: Diagnosis present

## 2022-06-19 DIAGNOSIS — Z882 Allergy status to sulfonamides status: Secondary | ICD-10-CM | POA: Diagnosis not present

## 2022-06-19 DIAGNOSIS — E440 Moderate protein-calorie malnutrition: Secondary | ICD-10-CM | POA: Diagnosis not present

## 2022-06-19 DIAGNOSIS — I2510 Atherosclerotic heart disease of native coronary artery without angina pectoris: Secondary | ICD-10-CM | POA: Diagnosis present

## 2022-06-19 DIAGNOSIS — N390 Urinary tract infection, site not specified: Secondary | ICD-10-CM | POA: Diagnosis not present

## 2022-06-19 DIAGNOSIS — E875 Hyperkalemia: Secondary | ICD-10-CM | POA: Diagnosis not present

## 2022-06-19 DIAGNOSIS — Z886 Allergy status to analgesic agent status: Secondary | ICD-10-CM

## 2022-06-19 DIAGNOSIS — E1140 Type 2 diabetes mellitus with diabetic neuropathy, unspecified: Secondary | ICD-10-CM | POA: Diagnosis not present

## 2022-06-19 DIAGNOSIS — R079 Chest pain, unspecified: Secondary | ICD-10-CM | POA: Diagnosis not present

## 2022-06-19 DIAGNOSIS — I700 Atherosclerosis of aorta: Secondary | ICD-10-CM | POA: Diagnosis not present

## 2022-06-19 DIAGNOSIS — I10 Essential (primary) hypertension: Secondary | ICD-10-CM | POA: Diagnosis not present

## 2022-06-19 DIAGNOSIS — N184 Chronic kidney disease, stage 4 (severe): Secondary | ICD-10-CM | POA: Diagnosis not present

## 2022-06-19 DIAGNOSIS — N1832 Chronic kidney disease, stage 3b: Secondary | ICD-10-CM

## 2022-06-19 DIAGNOSIS — R29898 Other symptoms and signs involving the musculoskeletal system: Secondary | ICD-10-CM

## 2022-06-19 DIAGNOSIS — Z9049 Acquired absence of other specified parts of digestive tract: Secondary | ICD-10-CM | POA: Diagnosis not present

## 2022-06-19 DIAGNOSIS — K760 Fatty (change of) liver, not elsewhere classified: Secondary | ICD-10-CM | POA: Diagnosis present

## 2022-06-19 DIAGNOSIS — M545 Low back pain, unspecified: Secondary | ICD-10-CM | POA: Diagnosis not present

## 2022-06-19 DIAGNOSIS — Z79899 Other long term (current) drug therapy: Secondary | ICD-10-CM | POA: Diagnosis not present

## 2022-06-19 DIAGNOSIS — I5032 Chronic diastolic (congestive) heart failure: Secondary | ICD-10-CM | POA: Diagnosis present

## 2022-06-19 DIAGNOSIS — Z299 Encounter for prophylactic measures, unspecified: Secondary | ICD-10-CM | POA: Diagnosis not present

## 2022-06-19 DIAGNOSIS — G319 Degenerative disease of nervous system, unspecified: Secondary | ICD-10-CM | POA: Diagnosis not present

## 2022-06-19 DIAGNOSIS — Z833 Family history of diabetes mellitus: Secondary | ICD-10-CM

## 2022-06-19 DIAGNOSIS — Z96641 Presence of right artificial hip joint: Secondary | ICD-10-CM | POA: Diagnosis present

## 2022-06-19 DIAGNOSIS — F419 Anxiety disorder, unspecified: Secondary | ICD-10-CM

## 2022-06-19 DIAGNOSIS — R4182 Altered mental status, unspecified: Secondary | ICD-10-CM | POA: Diagnosis not present

## 2022-06-19 DIAGNOSIS — R809 Proteinuria, unspecified: Secondary | ICD-10-CM | POA: Diagnosis not present

## 2022-06-19 DIAGNOSIS — K859 Acute pancreatitis without necrosis or infection, unspecified: Secondary | ICD-10-CM | POA: Diagnosis not present

## 2022-06-19 DIAGNOSIS — K861 Other chronic pancreatitis: Secondary | ICD-10-CM | POA: Diagnosis present

## 2022-06-19 DIAGNOSIS — R112 Nausea with vomiting, unspecified: Secondary | ICD-10-CM | POA: Diagnosis not present

## 2022-06-19 DIAGNOSIS — I6381 Other cerebral infarction due to occlusion or stenosis of small artery: Secondary | ICD-10-CM | POA: Diagnosis not present

## 2022-06-19 DIAGNOSIS — I509 Heart failure, unspecified: Secondary | ICD-10-CM | POA: Diagnosis not present

## 2022-06-19 DIAGNOSIS — A499 Bacterial infection, unspecified: Secondary | ICD-10-CM

## 2022-06-19 DIAGNOSIS — E1129 Type 2 diabetes mellitus with other diabetic kidney complication: Secondary | ICD-10-CM | POA: Diagnosis not present

## 2022-06-19 DIAGNOSIS — R7401 Elevation of levels of liver transaminase levels: Secondary | ICD-10-CM | POA: Diagnosis present

## 2022-06-19 DIAGNOSIS — E1122 Type 2 diabetes mellitus with diabetic chronic kidney disease: Secondary | ICD-10-CM | POA: Diagnosis not present

## 2022-06-19 DIAGNOSIS — E876 Hypokalemia: Secondary | ICD-10-CM | POA: Diagnosis not present

## 2022-06-19 DIAGNOSIS — K219 Gastro-esophageal reflux disease without esophagitis: Secondary | ICD-10-CM | POA: Diagnosis present

## 2022-06-19 DIAGNOSIS — M109 Gout, unspecified: Secondary | ICD-10-CM | POA: Diagnosis present

## 2022-06-19 DIAGNOSIS — I129 Hypertensive chronic kidney disease with stage 1 through stage 4 chronic kidney disease, or unspecified chronic kidney disease: Secondary | ICD-10-CM | POA: Diagnosis not present

## 2022-06-19 DIAGNOSIS — E782 Mixed hyperlipidemia: Secondary | ICD-10-CM | POA: Diagnosis not present

## 2022-06-19 DIAGNOSIS — Z91048 Other nonmedicinal substance allergy status: Secondary | ICD-10-CM

## 2022-06-19 LAB — CBC
HCT: 26.4 % — ABNORMAL LOW (ref 36.0–46.0)
Hemoglobin: 8.7 g/dL — ABNORMAL LOW (ref 12.0–15.0)
MCH: 32.1 pg (ref 26.0–34.0)
MCHC: 33 g/dL (ref 30.0–36.0)
MCV: 97.4 fL (ref 80.0–100.0)
Platelets: 230 10*3/uL (ref 150–400)
RBC: 2.71 MIL/uL — ABNORMAL LOW (ref 3.87–5.11)
RDW: 14.2 % (ref 11.5–15.5)
WBC: 6.9 10*3/uL (ref 4.0–10.5)
nRBC: 0 % (ref 0.0–0.2)

## 2022-06-19 LAB — BASIC METABOLIC PANEL
Anion gap: 10 (ref 5–15)
BUN: 70 mg/dL — ABNORMAL HIGH (ref 8–23)
CO2: 20 mmol/L — ABNORMAL LOW (ref 22–32)
Calcium: 9.1 mg/dL (ref 8.9–10.3)
Chloride: 98 mmol/L (ref 98–111)
Creatinine, Ser: 3.77 mg/dL — ABNORMAL HIGH (ref 0.44–1.00)
GFR, Estimated: 12 mL/min — ABNORMAL LOW (ref >60)
Glucose, Bld: 493 mg/dL — ABNORMAL HIGH (ref 70–99)
Potassium: 6.8 mmol/L (ref 3.5–5.1)
Sodium: 128 mmol/L — ABNORMAL LOW (ref 135–145)

## 2022-06-19 LAB — CBC WITH DIFFERENTIAL/PLATELET
Abs Immature Granulocytes: 0.1 10*3/uL — ABNORMAL HIGH (ref 0.00–0.07)
Basophils Absolute: 0.1 10*3/uL (ref 0.0–0.1)
Basophils Relative: 1 %
Eosinophils Absolute: 0.2 10*3/uL (ref 0.0–0.5)
Eosinophils Relative: 2 %
HCT: 30.2 % — ABNORMAL LOW (ref 36.0–46.0)
Hemoglobin: 9.7 g/dL — ABNORMAL LOW (ref 12.0–15.0)
Immature Granulocytes: 1 %
Lymphocytes Relative: 9 %
Lymphs Abs: 1 10*3/uL (ref 0.7–4.0)
MCH: 31.4 pg (ref 26.0–34.0)
MCHC: 32.1 g/dL (ref 30.0–36.0)
MCV: 97.7 fL (ref 80.0–100.0)
Monocytes Absolute: 0.6 10*3/uL (ref 0.1–1.0)
Monocytes Relative: 5 %
Neutro Abs: 8.6 10*3/uL — ABNORMAL HIGH (ref 1.7–7.7)
Neutrophils Relative %: 82 %
Platelets: 296 10*3/uL (ref 150–400)
RBC: 3.09 MIL/uL — ABNORMAL LOW (ref 3.87–5.11)
RDW: 14.3 % (ref 11.5–15.5)
WBC: 10.4 10*3/uL (ref 4.0–10.5)
nRBC: 0 % (ref 0.0–0.2)

## 2022-06-19 LAB — RENAL FUNCTION PANEL
Albumin: 3.2 g/dL — ABNORMAL LOW (ref 3.5–5.0)
Anion gap: 11 (ref 5–15)
BUN: 68 mg/dL — ABNORMAL HIGH (ref 8–23)
CO2: 20 mmol/L — ABNORMAL LOW (ref 22–32)
Calcium: 9.3 mg/dL (ref 8.9–10.3)
Chloride: 97 mmol/L — ABNORMAL LOW (ref 98–111)
Creatinine, Ser: 3.59 mg/dL — ABNORMAL HIGH (ref 0.44–1.00)
GFR, Estimated: 13 mL/min — ABNORMAL LOW (ref >60)
Glucose, Bld: 439 mg/dL — ABNORMAL HIGH (ref 70–99)
Phosphorus: 5.1 mg/dL — ABNORMAL HIGH (ref 2.5–4.6)
Potassium: >7.5 mmol/L (ref 3.5–5.1)
Sodium: 128 mmol/L — ABNORMAL LOW (ref 135–145)

## 2022-06-19 LAB — URINALYSIS, ROUTINE W REFLEX MICROSCOPIC
Bilirubin Urine: NEGATIVE
Glucose, UA: >=500 mg/dL — AB
Hgb urine dipstick: NEGATIVE
Ketones, ur: NEGATIVE mg/dL
Nitrite: NEGATIVE
Protein, ur: 100 mg/dL — AB
Specific Gravity, Urine: 1.01 (ref 1.005–1.030)
WBC, UA: >50 WBC/hpf — ABNORMAL HIGH (ref 0–5)
pH: 5 (ref 5.0–8.0)

## 2022-06-19 LAB — PROTEIN / CREATININE RATIO, URINE
Creatinine, Urine: 52.22 mg/dL
Protein Creatinine Ratio: 3.12 mg/mg{Cre} — ABNORMAL HIGH (ref 0.00–0.15)
Total Protein, Urine: 163 mg/dL

## 2022-06-19 LAB — TROPONIN I (HIGH SENSITIVITY)
Troponin I (High Sensitivity): 12 ng/L (ref <18)
Troponin I (High Sensitivity): 14 ng/L (ref <18)

## 2022-06-19 LAB — POTASSIUM: Potassium: 7.1 mmol/L (ref 3.5–5.1)

## 2022-06-19 LAB — CBG MONITORING, ED: Glucose-Capillary: 386 mg/dL — ABNORMAL HIGH (ref 70–99)

## 2022-06-19 LAB — POCT HEMOGLOBIN-HEMACUE: Hemoglobin: 10.1 g/dL — ABNORMAL LOW (ref 12.0–15.0)

## 2022-06-19 LAB — LIPASE, BLOOD: Lipase: 96 U/L — ABNORMAL HIGH (ref 11–51)

## 2022-06-19 MED ORDER — INSULIN ASPART 100 UNIT/ML IV SOLN
5.0000 [IU] | Freq: Once | INTRAVENOUS | Status: AC
Start: 2022-06-19 — End: 2022-06-19
  Administered 2022-06-19: 5 [IU] via INTRAVENOUS

## 2022-06-19 MED ORDER — INSULIN ASPART 100 UNIT/ML IV SOLN: 5.0000 [IU] | Freq: Once | INTRAVENOUS | Status: DC

## 2022-06-19 MED ORDER — CALCIUM GLUCONATE 10 % IV SOLN
1.0000 g | Freq: Once | INTRAVENOUS | Status: AC
  Administered 2022-06-19: 1 g via INTRAVENOUS
  Filled 2022-06-19: qty 10

## 2022-06-19 MED ORDER — FUROSEMIDE 10 MG/ML IJ SOLN
40.0000 mg | Freq: Once | INTRAMUSCULAR | Status: AC
  Administered 2022-06-19: 40 mg via INTRAVENOUS
  Filled 2022-06-19: qty 4

## 2022-06-19 MED ORDER — SODIUM ZIRCONIUM CYCLOSILICATE 10 G PO PACK
10.0000 g | PACK | Freq: Three times a day (TID) | ORAL | Status: DC
  Administered 2022-06-19 – 2022-06-20 (×3): 10 g via ORAL
  Filled 2022-06-19: qty 2
  Filled 2022-06-19 (×2): qty 1

## 2022-06-19 MED ORDER — EPOETIN ALFA-EPBX 3000 UNIT/ML IJ SOLN: 3000.0000 [IU] | Freq: Once | INTRAMUSCULAR | Status: DC

## 2022-06-19 MED ORDER — ONDANSETRON HCL 4 MG/2ML IJ SOLN
4.0000 mg | Freq: Once | INTRAMUSCULAR | Status: AC
  Administered 2022-06-19: 4 mg via INTRAVENOUS
  Filled 2022-06-19: qty 2

## 2022-06-19 MED ORDER — ALBUTEROL SULFATE (2.5 MG/3ML) 0.083% IN NEBU
10.0000 mg | INHALATION_SOLUTION | Freq: Once | RESPIRATORY_TRACT | Status: AC
  Administered 2022-06-19: 10 mg via RESPIRATORY_TRACT
  Filled 2022-06-19: qty 12

## 2022-06-19 MED ORDER — MORPHINE SULFATE (PF) 4 MG/ML IV SOLN
4.0000 mg | Freq: Once | INTRAVENOUS | Status: AC
  Administered 2022-06-19: 4 mg via INTRAVENOUS
  Filled 2022-06-19: qty 1

## 2022-06-19 MED ORDER — FUROSEMIDE 10 MG/ML IJ SOLN
80.0000 mg | Freq: Once | INTRAMUSCULAR | Status: AC
  Administered 2022-06-19: 80 mg via INTRAVENOUS
  Filled 2022-06-19: qty 8

## 2022-06-19 MED ORDER — DEXTROSE 50 % IV SOLN: 1.0000 | Freq: Once | INTRAVENOUS | Status: DC

## 2022-06-19 MED ORDER — SODIUM CHLORIDE 0.9 % IV SOLN
1.0000 g | Freq: Once | INTRAVENOUS | Status: AC
  Administered 2022-06-19: 1 g via INTRAVENOUS
  Filled 2022-06-19: qty 10

## 2022-06-19 MED ORDER — DEXTROSE 50 % IV SOLN
1.0000 | Freq: Once | INTRAVENOUS | Status: AC
Start: 2022-06-19 — End: 2022-06-19
  Administered 2022-06-19: 50 mL via INTRAVENOUS
  Filled 2022-06-19: qty 50

## 2022-06-19 MED ORDER — SODIUM ZIRCONIUM CYCLOSILICATE 5 G PO PACK
10.0000 g | PACK | Freq: Once | ORAL | Status: AC
  Administered 2022-06-19: 10 g via ORAL
  Filled 2022-06-19: qty 2

## 2022-06-19 NOTE — OR Nursing (Signed)
Per lab patient potassium greater than 7.5. Reported to [facility] at Dr. Theador Hawthorne office.  She was already aware and patient is told to go to the Emergency room.

## 2022-06-19 NOTE — ED Notes (Signed)
Pt to ct 

## 2022-06-19 NOTE — Progress Notes (Signed)
Informed of patient in ER. Sent in with abnormal labs K >7.5. No significant EKG changes. Noted to have profound hyperglycemia as well. With AKI as well. Recommendations: -with strict control of sugars, K should improve -continue to monitor serial K -can utilize shifting agents, lokelma 10g TID (ordered) -if no significant improvement, can try lasix with fluids for kaliuresis (may need fluids overall given hyperglycemia, but will wait for admitting team to evaluate her first) -if she fails medical management and remains hyperkalemic especially despite her sugars being controlled, the next step would be renal replacement therapy -Full consult to follow, call with any questions/concerns. Discussed w/ ER MD.   Gean Quint, MD [facility]

## 2022-06-19 NOTE — ED Triage Notes (Signed)
Pt states she has high BP and high potassium- reading over 7.5. Pt states slight chest pain.  Pt endorses emesis x 3-4 days.

## 2022-06-20 ENCOUNTER — Inpatient Hospital Stay (HOSPITAL_COMMUNITY): Payer: Medicare HMO

## 2022-06-20 ENCOUNTER — Encounter (HOSPITAL_COMMUNITY): Payer: Self-pay | Admitting: Family Medicine

## 2022-06-20 DIAGNOSIS — I10 Essential (primary) hypertension: Secondary | ICD-10-CM

## 2022-06-20 DIAGNOSIS — N390 Urinary tract infection, site not specified: Secondary | ICD-10-CM

## 2022-06-20 DIAGNOSIS — N179 Acute kidney failure, unspecified: Secondary | ICD-10-CM | POA: Diagnosis not present

## 2022-06-20 DIAGNOSIS — F419 Anxiety disorder, unspecified: Secondary | ICD-10-CM

## 2022-06-20 DIAGNOSIS — E875 Hyperkalemia: Secondary | ICD-10-CM | POA: Diagnosis not present

## 2022-06-20 DIAGNOSIS — E440 Moderate protein-calorie malnutrition: Secondary | ICD-10-CM | POA: Insufficient documentation

## 2022-06-20 DIAGNOSIS — R29898 Other symptoms and signs involving the musculoskeletal system: Secondary | ICD-10-CM

## 2022-06-20 LAB — GLUCOSE, CAPILLARY
Glucose-Capillary: 121 mg/dL — ABNORMAL HIGH (ref 70–99)
Glucose-Capillary: 117 mg/dL — ABNORMAL HIGH (ref 70–99)
Glucose-Capillary: 96 mg/dL (ref 70–99)
Glucose-Capillary: 123 mg/dL — ABNORMAL HIGH (ref 70–99)
Glucose-Capillary: 67 mg/dL — ABNORMAL LOW (ref 70–99)
Glucose-Capillary: 83 mg/dL (ref 70–99)
Glucose-Capillary: 132 mg/dL — ABNORMAL HIGH (ref 70–99)

## 2022-06-20 LAB — RENAL FUNCTION PANEL
Albumin: 2.1 g/dL — ABNORMAL LOW (ref 3.5–5.0)
Anion gap: 5 (ref 5–15)
BUN: 56 mg/dL — ABNORMAL HIGH (ref 8–23)
CO2: 19 mmol/L — ABNORMAL LOW (ref 22–32)
Calcium: 7 mg/dL — ABNORMAL LOW (ref 8.9–10.3)
Chloride: 113 mmol/L — ABNORMAL HIGH (ref 98–111)
Creatinine, Ser: 2.99 mg/dL — ABNORMAL HIGH (ref 0.44–1.00)
GFR, Estimated: 16 mL/min — ABNORMAL LOW (ref >60)
Glucose, Bld: 101 mg/dL — ABNORMAL HIGH (ref 70–99)
Phosphorus: 4.5 mg/dL (ref 2.5–4.6)
Potassium: 4.9 mmol/L (ref 3.5–5.1)
Sodium: 137 mmol/L (ref 135–145)

## 2022-06-20 LAB — CBC WITH DIFFERENTIAL/PLATELET
Abs Immature Granulocytes: 0.09 10*3/uL — ABNORMAL HIGH (ref 0.00–0.07)
Basophils Absolute: 0.1 10*3/uL (ref 0.0–0.1)
Basophils Relative: 1 %
Eosinophils Absolute: 0.2 10*3/uL (ref 0.0–0.5)
Eosinophils Relative: 2 %
HCT: 30 % — ABNORMAL LOW (ref 36.0–46.0)
Hemoglobin: 9.5 g/dL — ABNORMAL LOW (ref 12.0–15.0)
Immature Granulocytes: 1 %
Lymphocytes Relative: 20 %
Lymphs Abs: 1.9 10*3/uL (ref 0.7–4.0)
MCH: 31.3 pg (ref 26.0–34.0)
MCHC: 31.7 g/dL (ref 30.0–36.0)
MCV: 98.7 fL (ref 80.0–100.0)
Monocytes Absolute: 0.6 10*3/uL (ref 0.1–1.0)
Monocytes Relative: 7 %
Neutro Abs: 6.7 10*3/uL (ref 1.7–7.7)
Neutrophils Relative %: 69 %
Platelets: 256 10*3/uL (ref 150–400)
RBC: 3.04 MIL/uL — ABNORMAL LOW (ref 3.87–5.11)
RDW: 14.1 % (ref 11.5–15.5)
WBC: 9.7 10*3/uL (ref 4.0–10.5)
nRBC: 0 % (ref 0.0–0.2)

## 2022-06-20 LAB — COMPREHENSIVE METABOLIC PANEL
ALT: 32 U/L (ref 0–44)
AST: 40 U/L (ref 15–41)
Albumin: 2.8 g/dL — ABNORMAL LOW (ref 3.5–5.0)
Alkaline Phosphatase: 130 U/L — ABNORMAL HIGH (ref 38–126)
Anion gap: 10 (ref 5–15)
BUN: 69 mg/dL — ABNORMAL HIGH (ref 8–23)
CO2: 21 mmol/L — ABNORMAL LOW (ref 22–32)
Calcium: 9 mg/dL (ref 8.9–10.3)
Chloride: 103 mmol/L (ref 98–111)
Creatinine, Ser: 3.57 mg/dL — ABNORMAL HIGH (ref 0.44–1.00)
GFR, Estimated: 13 mL/min — ABNORMAL LOW (ref >60)
Glucose, Bld: 110 mg/dL — ABNORMAL HIGH (ref 70–99)
Potassium: 5.7 mmol/L — ABNORMAL HIGH (ref 3.5–5.1)
Sodium: 134 mmol/L — ABNORMAL LOW (ref 135–145)
Total Bilirubin: 0.5 mg/dL (ref 0.3–1.2)
Total Protein: 6.7 g/dL (ref 6.5–8.1)

## 2022-06-20 LAB — HEMOGLOBIN A1C
Hgb A1c MFr Bld: 10 % — ABNORMAL HIGH (ref 4.8–5.6)
Mean Plasma Glucose: 240.3 mg/dL

## 2022-06-20 LAB — MRSA NEXT GEN BY PCR, NASAL: MRSA by PCR Next Gen: NOT DETECTED

## 2022-06-20 LAB — CK: Total CK: 38 U/L (ref 38–234)

## 2022-06-20 LAB — MAGNESIUM: Magnesium: 1.4 mg/dL — ABNORMAL LOW (ref 1.7–2.4)

## 2022-06-20 MED ORDER — MAGNESIUM SULFATE 2 GM/50ML IV SOLN
2.0000 g | Freq: Once | INTRAVENOUS | Status: AC
  Administered 2022-06-20: 2 g via INTRAVENOUS
  Filled 2022-06-20: qty 50

## 2022-06-20 MED ORDER — GABAPENTIN 300 MG PO CAPS
300.0000 mg | ORAL_CAPSULE | Freq: Every day | ORAL | Status: DC
  Administered 2022-06-20 – 2022-06-21 (×2): 300 mg via ORAL
  Filled 2022-06-20 (×2): qty 1

## 2022-06-20 MED ORDER — ACETAMINOPHEN 650 MG RE SUPP: 650.0000 mg | Freq: Four times a day (QID) | RECTAL | Status: DC | PRN

## 2022-06-20 MED ORDER — METHOCARBAMOL 1000 MG/10ML IJ SOLN
500.0000 mg | Freq: Four times a day (QID) | INTRAVENOUS | Status: DC | PRN
  Administered 2022-06-20: 500 mg via INTRAVENOUS
  Filled 2022-06-20: qty 500

## 2022-06-20 MED ORDER — METOPROLOL SUCCINATE ER 50 MG PO TB24
100.0000 mg | ORAL_TABLET | Freq: Every day | ORAL | Status: DC
  Administered 2022-06-20 – 2022-06-22 (×3): 100 mg via ORAL
  Filled 2022-06-20 (×3): qty 2

## 2022-06-20 MED ORDER — SODIUM CHLORIDE 0.9 % IV SOLN
1.0000 g | INTRAVENOUS | Status: DC
  Administered 2022-06-20 – 2022-06-21 (×2): 1 g via INTRAVENOUS
  Filled 2022-06-20 (×2): qty 10

## 2022-06-20 MED ORDER — ONDANSETRON HCL 4 MG PO TABS: 4.0000 mg | ORAL_TABLET | Freq: Four times a day (QID) | ORAL | Status: DC | PRN

## 2022-06-20 MED ORDER — INSULIN ASPART 100 UNIT/ML IJ SOLN: 0.0000 [IU] | Freq: Every day | INTRAMUSCULAR | Status: DC

## 2022-06-20 MED ORDER — ATORVASTATIN CALCIUM 40 MG PO TABS
80.0000 mg | ORAL_TABLET | Freq: Every day | ORAL | Status: DC
  Administered 2022-06-20 – 2022-06-21 (×2): 80 mg via ORAL
  Filled 2022-06-20 (×2): qty 2

## 2022-06-20 MED ORDER — BOOST / RESOURCE BREEZE PO LIQD CUSTOM
1.0000 | Freq: Three times a day (TID) | ORAL | Status: DC
  Administered 2022-06-20 – 2022-06-21 (×5): 1 via ORAL

## 2022-06-20 MED ORDER — BUSPIRONE HCL 5 MG PO TABS
10.0000 mg | ORAL_TABLET | Freq: Three times a day (TID) | ORAL | Status: DC | PRN
  Administered 2022-06-22: 10 mg via ORAL
  Filled 2022-06-20: qty 2

## 2022-06-20 MED ORDER — CHLORHEXIDINE GLUCONATE CLOTH 2 % EX PADS
6.0000 | MEDICATED_PAD | Freq: Every day | CUTANEOUS | Status: DC
  Administered 2022-06-20 – 2022-06-22 (×3): 6 via TOPICAL

## 2022-06-20 MED ORDER — INSULIN ASPART 100 UNIT/ML IJ SOLN
0.0000 [IU] | Freq: Three times a day (TID) | INTRAMUSCULAR | Status: DC
  Administered 2022-06-20: 2 [IU] via SUBCUTANEOUS
  Administered 2022-06-21 (×2): 3 [IU] via SUBCUTANEOUS

## 2022-06-20 MED ORDER — ONDANSETRON HCL 4 MG/2ML IJ SOLN
4.0000 mg | Freq: Four times a day (QID) | INTRAMUSCULAR | Status: DC | PRN
  Administered 2022-06-20 – 2022-06-22 (×7): 4 mg via INTRAVENOUS
  Filled 2022-06-20 (×7): qty 2

## 2022-06-20 MED ORDER — PROSOURCE PLUS PO LIQD
30.0000 mL | Freq: Three times a day (TID) | ORAL | Status: DC
  Administered 2022-06-20 (×2): 30 mL via ORAL
  Filled 2022-06-20 (×5): qty 30

## 2022-06-20 MED ORDER — ALBUTEROL SULFATE (2.5 MG/3ML) 0.083% IN NEBU: 2.5000 mg | INHALATION_SOLUTION | RESPIRATORY_TRACT | Status: DC | PRN

## 2022-06-20 MED ORDER — SODIUM CHLORIDE 0.9 % IV SOLN: INTRAVENOUS | Status: DC

## 2022-06-20 MED ORDER — MORPHINE SULFATE (PF) 2 MG/ML IV SOLN
2.0000 mg | INTRAVENOUS | Status: DC | PRN
  Administered 2022-06-20 – 2022-06-22 (×11): 2 mg via INTRAVENOUS
  Filled 2022-06-20 (×11): qty 1

## 2022-06-20 MED ORDER — SODIUM CHLORIDE 0.9 % IV BOLUS
500.0000 mL | Freq: Once | INTRAVENOUS | Status: AC
  Administered 2022-06-20: 500 mL via INTRAVENOUS

## 2022-06-20 MED ORDER — HEPARIN SODIUM (PORCINE) 5000 UNIT/ML IJ SOLN
5000.0000 [IU] | Freq: Three times a day (TID) | INTRAMUSCULAR | Status: DC
  Administered 2022-06-20 – 2022-06-22 (×7): 5000 [IU] via SUBCUTANEOUS
  Filled 2022-06-20 (×8): qty 1

## 2022-06-20 MED ORDER — SODIUM BICARBONATE 650 MG PO TABS
1300.0000 mg | ORAL_TABLET | Freq: Two times a day (BID) | ORAL | Status: DC
  Administered 2022-06-20 – 2022-06-22 (×5): 1300 mg via ORAL
  Filled 2022-06-20 (×5): qty 2

## 2022-06-20 MED ORDER — ACETAMINOPHEN 325 MG PO TABS: 650.0000 mg | ORAL_TABLET | Freq: Four times a day (QID) | ORAL | Status: DC | PRN

## 2022-06-20 MED ORDER — DIAZEPAM 5 MG PO TABS
5.0000 mg | ORAL_TABLET | Freq: Two times a day (BID) | ORAL | Status: DC | PRN
  Administered 2022-06-20: 5 mg via ORAL
  Filled 2022-06-20: qty 1

## 2022-06-20 MED ORDER — CALCITRIOL 0.25 MCG PO CAPS
0.2500 ug | ORAL_CAPSULE | ORAL | Status: DC
  Administered 2022-06-20 – 2022-06-22 (×2): 0.25 ug via ORAL
  Filled 2022-06-20 (×3): qty 1

## 2022-06-20 MED ORDER — LEVETIRACETAM 500 MG PO TABS
500.0000 mg | ORAL_TABLET | Freq: Two times a day (BID) | ORAL | Status: DC
  Administered 2022-06-20 – 2022-06-22 (×5): 500 mg via ORAL
  Filled 2022-06-20 (×6): qty 1

## 2022-06-20 MED ORDER — HYDRALAZINE HCL 25 MG PO TABS
25.0000 mg | ORAL_TABLET | Freq: Two times a day (BID) | ORAL | Status: DC
  Administered 2022-06-20 – 2022-06-21 (×3): 25 mg via ORAL
  Filled 2022-06-20 (×3): qty 1

## 2022-06-20 NOTE — Assessment & Plan Note (Signed)
-   Potassium initially 6.8, on repeat 7.1 - Patient was given insulin/ D50 combo, Lasix/fluids combo, albuterol treatment, and Lokelma - Nephrology was consulted and just recommended more Lasix, patient does not need emergent dialysis tonight - Hyperkalemia secondary to AKI - Monitor on telemetry

## 2022-06-20 NOTE — Assessment & Plan Note (Signed)
Continue hydralazine and metoprolol 

## 2022-06-20 NOTE — Assessment & Plan Note (Signed)
-   Patient reports cute onset of right leg weakness 4 days ago that has made it so she cannot walk -Check a Head CT - Weakness is subtly discernible on exam - No other acute deficits

## 2022-06-20 NOTE — Assessment & Plan Note (Addendum)
-   Continue statin - 10 units basal insulin at baseline - Hold basal insulin here, sliding scale coverage - Renal diet - Last hemoglobin A1c was 5.6 - Monitor CBGs

## 2022-06-20 NOTE — Assessment & Plan Note (Signed)
-   Continue Valium and BuSpar

## 2022-06-20 NOTE — Hospital Course (Addendum)
71 y/o female with a history of diabetes mellitus type 2, hyperlipidemia, Crohn's disease, CKD stage 4, B12 deficiency, anxiety disorder, stroke, HTN, chronic pancreatitis, mixed hyperlipidemia presenting after she was notified by her PCP to go to the ED for abnormal labs. The patient is a difficult historian, but she is able to give generalities.  The details of her history are somewhat conflicting.  Nevertheless, it appears that the patient has been having nausea with worsening abdominal pain for the past 3 days. She denies any f/c, vomiting, diarrhea, dysuria.  She complains of right leg weakness which seems to have started in late Jan 2023 after a fall.  She also has frequent falls at home.  She also complains of low back pain which seems to be somewhat exacerbated by her recent falls.  She denies syncope.  She denies any hematochezia or melena.   In the ED, the patient was afebrile and hemodynamically stable with oxygen saturation 100% room air.  Initial BMP showed a potassium > 7.5 with serum creatinine 3.77.  LFTs were unremarkable.  WBC 10.4, hemoglobin 9.7, platelets 296,000.

## 2022-06-20 NOTE — Assessment & Plan Note (Signed)
-   UA indicative of UTI Urine culture pending Continue Rocephin

## 2022-06-20 NOTE — Progress Notes (Addendum)
PROGRESS NOTE  Heather [name] GYI:[id number] DOB: 07/17/1951 DOA: 06/19/2022 PCP: [name], MD  Brief History:  71 y/o female with a history of diabetes mellitus type 2, hyperlipidemia, Crohn's disease, CKD stage 4, B12 deficiency, anxiety disorder, stroke, HTN, chronic pancreatitis, mixed hyperlipidemia presenting after she was notified by her PCP to go to the ED for abnormal labs. The patient is a difficult historian, but she is able to give generalities.  The details of her history are somewhat conflicting.  Nevertheless, it appears that the patient has been having nausea with worsening abdominal pain for the past 3 days. She denies any f/c, vomiting, diarrhea, dysuria.  She complains of right leg weakness which seems to have started in late Jan 2023 after a fall.  She also has frequent falls at home.  She also complains of low back pain which seems to be somewhat exacerbated by her recent falls.  She denies syncope.  She denies any hematochezia or melena.   In the ED, the patient was afebrile and hemodynamically stable with oxygen saturation 100% room air.  Initial BMP showed a potassium > 7.5 with serum creatinine 3.77.  LFTs were unremarkable.  WBC 10.4, hemoglobin 9.7, platelets 296,000.     Assessment/Plan: Acute on chronic pancreatitis -Lipase 96 -06/19/2029 CT abdomen--fatty infiltration of the body and tail of the pancreas, old T12 compression fracture -Start clear liquid diet -Judicious opioids -Check lipid panel  Acute on chronic renal failure--CKD 4 -Baseline creatinine 2.3-2.7 -Secondary to volume depletion -Continue IV fluids -no hydronephrosis on CT abd  Uncontrolled diabetes mellitus type 2 with hyperglycemia -Presented with CBGs in the 400s -Check hemoglobin A1c -NovoLog sliding scale  Hyperkalemia -Continue Lokelma -Continue IV fluids -Remain on telemetry  Mixed hyperlipidemia -Continue statin  Right leg weakness -Check CPK -CT brain  neg -MRI lumbar spine  Visual disturbance -MRI brain  Essential hypertension -Continue metoprolol succinate and hydralazine  Crohn's Disease -no active symptoms presently -oupt GI followup   Hypomagnesesmia -replete judiciously    Family Communication:   Family at bedside  Consultants:  none  Code Status:  FULL   DVT Prophylaxis:  [place] Heparin    Procedures: As Listed in Progress Note Above  Antibiotics: None  RN Pressure Injury Documentation:      Subjective: She complains of abdominal pain and nausea.  She has some double vision.  Denies any chest pain, shortness of breath, vomiting, headache.  She states that her right arm has been weak since her fall in January as well as her right leg.  Objective: Vitals:   06/20/22 0730 06/20/22 0800 06/20/22 0857 06/20/22 0900  BP:  109/61 (!) 126/53 (!) 107/29  Pulse:  98 (!) 106 (!) 105  Resp:  14  (!) 21  Temp: 97.8 F (36.6 C)     TempSrc: Oral     SpO2:  97%  100%  Weight:      Height:        Intake/Output Summary (Last 24 hours) at 06/20/2022 1001 Last data filed at 06/20/2022 0847 Gross per 24 hour  Intake 870 ml  Output 680 ml  Net 190 ml   Weight change:  Exam:  General:  Pt is alert, follows commands appropriately, not in acute distress HEENT: No icterus, No thrush, No neck mass, [place]/AT Cardiovascular: RRR, S1/S2, no rubs, no gallops Respiratory: CTA bilaterally, no wheezing, no crackles, no rhonchi Abdomen: Soft/+BS, non tender, non distended, no guarding Extremities:  No edema, No lymphangitis, No petechiae, No rashes, no synovitis   Data Reviewed: I have personally reviewed following labs and imaging studies Basic Metabolic Panel: Recent Labs  Lab 06/19/22 1455 06/19/22 1859 06/19/22 2143 06/20/22 0301  NA 128* 128*  --  134*  K >7.5* 6.8* 7.1* 5.7*  CL 97* 98  --  103  CO2 20* 20*  --  21*  GLUCOSE 439* 493*  --  110*  BUN 68* 70*  --  69*  CREATININE 3.59* 3.77*  --  3.57*   CALCIUM 9.3 9.1  --  9.0  MG  --   --   --  1.4*  PHOS 5.1*  --   --   --    Liver Function Tests: Recent Labs  Lab 06/19/22 1455 06/20/22 0301  AST  --  40  ALT  --  32  ALKPHOS  --  130*  BILITOT  --  0.5  PROT  --  6.7  ALBUMIN 3.2* 2.8*   Recent Labs  Lab 06/19/22 2143  LIPASE 96*   No results for input(s): "AMMONIA" in the last 168 hours. Coagulation Profile: No results for input(s): "INR", "PROTIME" in the last 168 hours. CBC: Recent Labs  Lab 06/19/22 1432 06/19/22 1454 06/19/22 1859 06/20/22 0301  WBC  --  10.4 6.9 9.7  NEUTROABS  --  8.6*  --  6.7  HGB 10.1* 9.7* 8.7* 9.5*  HCT  --  30.2* 26.4* 30.0*  MCV  --  97.7 97.4 98.7  PLT  --  296 230 256   Cardiac Enzymes: No results for input(s): "CKTOTAL", "CKMB", "CKMBINDEX", "TROPONINI" in the last 168 hours. BNP: Invalid input(s): "POCBNP" CBG: Recent Labs  Lab 06/19/22 1932 06/20/22 0051 06/20/22 0218 06/20/22 0726  GLUCAP 386* 117* 121* 96   HbA1C: No results for input(s): "HGBA1C" in the last 72 hours. Urine analysis:    Component Value Date/Time   COLORURINE YELLOW 06/19/2022 2030   APPEARANCEUR HAZY (A) 06/19/2022 2030   LABSPEC 1.010 06/19/2022 2030   PHURINE 5.0 06/19/2022 2030   GLUCOSEU >=500 (A) 06/19/2022 2030   GLUCOSEU NEGATIVE 03/28/2018 1224   HGBUR NEGATIVE 06/19/2022 2030   [place] NEGATIVE 06/19/2022 2030   [place] 06/19/2022 2030   PROTEINUR 100 (A) 06/19/2022 2030   UROBILINOGEN 1.0 03/28/2018 1224   NITRITE NEGATIVE 06/19/2022 2030   LEUKOCYTESUR SMALL (A) 06/19/2022 2030   Sepsis Labs: '@LABRCNTIP'$ (procalcitonin:4,lacticidven:4) ) Recent Results (from the past 240 hour(s))  MRSA Next Gen by PCR, Nasal     Status: None   Collection Time: 06/20/22 12:56 AM   Specimen: Nasal Mucosa; Nasal Swab  Result Value Ref Range Status   MRSA by PCR Next Gen NOT DETECTED NOT DETECTED Final    Comment: (NOTE) The GeneXpert MRSA Assay (FDA approved for NASAL  specimens only), is one component of a comprehensive MRSA colonization surveillance program. It is not intended to diagnose MRSA infection nor to guide or monitor treatment for MRSA infections. Test performance is not FDA approved in patients less than 49 years old. Performed at [facility], [address] [place], [place] [address]      Scheduled Meds:  atorvastatin  80 mg Oral Daily   calcitRIOL  0.25 mcg Oral Once per day on Mon Wed Fri   Chlorhexidine Gluconate Cloth  6 each Topical Q0600   gabapentin  300 mg Oral QHS   heparin  5,000 Units Subcutaneous Q8H   hydrALAZINE  25 mg Oral BID  insulin aspart  0-15 Units Subcutaneous TID WC   insulin aspart  0-5 Units Subcutaneous QHS   levETIRAcetam  500 mg Oral BID   metoprolol succinate  100 mg Oral Daily   sodium bicarbonate  1,300 mg Oral BID   sodium zirconium cyclosilicate  10 g Oral TID   Continuous Infusions:  sodium chloride 150 mL/hr at 06/20/22 0400   cefTRIAXone (ROCEPHIN)  IV     methocarbamol (ROBAXIN) IV      Procedures/Studies: CT HEAD WO CONTRAST (5MM)  Result Date: 06/20/2022 CLINICAL DATA:  71 year old female with altered mental status, right leg weakness. EXAM: CT HEAD WITHOUT CONTRAST TECHNIQUE: Contiguous axial images were obtained from the base of the skull through the vertex without intravenous contrast. RADIATION DOSE REDUCTION: This exam was performed according to the departmental dose-optimization program which includes automated exposure control, adjustment of the mA and/or kV according to patient size and/or use of iterative reconstruction technique. COMPARISON:  Head CT 03/14/2022, brain MRI 03/09/2022. FINDINGS: Brain: Stable cerebral volume. No midline shift, mass effect, or evidence of intracranial mass lesion. No ventriculomegaly. No acute intracranial hemorrhage identified. Chronic lacunar infarct at the left genu internal capsule is stable along with mild additional bilateral deep white matter  heterogeneity. Preserved gray-white matter differentiation otherwise. No definite cortical encephalomalacia. Vascular: Extensive Calcified atherosclerosis at the skull base. No suspicious intracranial vascular hyperdensity. Skull: No acute osseous abnormality identified. Sinuses/Orbits: Visualized paranasal sinuses and mastoids are stable and well aerated. Other: No acute orbit or scalp soft tissue finding. IMPRESSION: No acute intracranial abnormality. Stable non contrast CT appearance of chronic small vessel disease. Electronically Signed   By: Genevie [name] M.D.   On: 06/20/2022 06:06   DG Chest 2 View  Result Date: 06/19/2022 CLINICAL DATA:  Chest pain EXAM: CHEST - 2 VIEW COMPARISON:  Radiographs 03/08/2022 FINDINGS: No focal consolidation, pleural effusion, or pneumothorax. Normal cardiomediastinal silhouette. No acute osseous abnormality. Chronic displaced fracture of the proximal right humerus. Aortic calcification. Cervical spine fusion hardware. IMPRESSION: No active cardiopulmonary disease. Electronically Signed   By: Placido Sou M.D.   On: 06/19/2022 20:25   CT ABDOMEN PELVIS WO CONTRAST  Result Date: 06/19/2022 CLINICAL DATA:  Abdominal pain. EXAM: CT ABDOMEN AND PELVIS WITHOUT CONTRAST TECHNIQUE: Multidetector CT imaging of the abdomen and pelvis was performed following the standard protocol without IV contrast. RADIATION DOSE REDUCTION: This exam was performed according to the departmental dose-optimization program which includes automated exposure control, adjustment of the mA and/or kV according to patient size and/or use of iterative reconstruction technique. COMPARISON:  CT abdomen pelvis dated 05/26/2022. FINDINGS: Evaluation of this exam is limited in the absence of intravenous contrast. Lower chest: The visualized lung bases are clear. There is coronary vascular calcification and calcification of the mitral annulus. No intra-abdominal free air or free fluid. Hepatobiliary: The liver is  unremarkable. No biliary dilatation. Cholecystectomy. No retained calcified stone noted in the central CBD. Pancreas: There is infiltration of the fat surrounding the distal body and tail of the pancreas with loss of fat plane between the pancreas and stomach similar to prior CT. Findings most consistent with scarring and adhesions sequela of chronic pancreatitis. Correlation with pancreatic enzymes recommended to evaluate for possibility of acute on chronic pancreatitis. No drainable fluid collection, abscess, or pseudocyst. Spleen: Normal in size without focal abnormality. Adrenals/Urinary Tract: The adrenal glands unremarkable. Mild bilateral renal parenchyma atrophy. There is a 2 mm nonobstructing right renal mid pole calculus. No hydronephrosis on either side. Subcentimeter  exophytic left renal interpolar hypodense lesion is too small to characterize but appears similar to prior CT, likely a cyst. The visualized ureters and urinary bladder appear unremarkable. Stomach/Bowel: There is moderate stool throughout the colon. There is no bowel obstruction or active inflammation. The appendix is not visualized with certainty. No inflammatory changes identified in the right lower quadrant. Vascular/Lymphatic: Advanced aortoiliac atherosclerotic disease. The IVC is unremarkable. No portal venous gas. There is no adenopathy. Reproductive: Hysterectomy.  No adnexal masses. Other: None Musculoskeletal: There is osteopenia with degenerative changes spine and multilevel posterior fusion hardware. Similar appearance of T12 compression fracture as the prior CT. No new fracture. IMPRESSION: 1. No bowel obstruction. 2. A 2 mm nonobstructing right renal mid pole calculus. No hydronephrosis. 3. Infiltration of the fat surrounding the distal body and tail of the pancreas with loss of fat plane between the pancreas and stomach similar to prior CT. Findings most consistent with scarring and adhesions sequela of chronic pancreatitis.  Correlation with pancreatic enzymes recommended to evaluate for possibility of acute on chronic pancreatitis. No drainable fluid collection, abscess, or pseudocyst. 4. T12 compression fracture similar to prior CT. 5. Aortic Atherosclerosis (ICD10-I70.0). Electronically Signed   By: Anner Crete M.D.   On: 06/19/2022 20:23   CT ABDOMEN PELVIS WO CONTRAST  Result Date: 05/26/2022 CLINICAL DATA:  Left lower quadrant abdominal pain. Nausea and vomiting. EXAM: CT ABDOMEN AND PELVIS WITHOUT CONTRAST TECHNIQUE: Multidetector CT imaging of the abdomen and pelvis was performed following the standard protocol without IV contrast. RADIATION DOSE REDUCTION: This exam was performed according to the departmental dose-optimization program which includes automated exposure control, adjustment of the mA and/or kV according to patient size and/or use of iterative reconstruction technique. COMPARISON:  CT abdomen pelvis 01/07/2021. FINDINGS: Lower chest: Subtle scattered centrilobular ground-glass opacities in the visualized lower lobes and right middle lobe. No pleural effusion. A 0.5 cm solid nodule in the left lung base is stable compared to 03/02/2020, consistent with benignity (series 4, image 26). Hepatobiliary: No focal liver abnormality is seen. Status post cholecystectomy. No biliary dilatation. Pancreas: Subtle peripancreatic fat stranding is noted along the tail of the pancreas. No peripancreatic fluid collection. The pancreas does not appear enlarged. Spleen: Normal in size. Numerous punctate calcifications may reflect calcified granulomas versus vascular calcifications. Adrenals/Urinary Tract: Adrenal glands are unremarkable. A 0.8 cm exophytic lesion at the posterior aspect of the middle third of the left kidney demonstrates attenuation of -23 Hounsfield units, consistent with a fat containing mass (series 2, image 36). No associated calcifications. The mass is stable compared to CT dated 02/05/2020 and is  consistent with a stable angiomyolipoma; no additional dedicated follow-up imaging is indicated. A punctate nonobstructive calyceal stone is noted in the upper pole of the right kidney (series 6, image 55). Urinary bladder is minimally filled and unremarkable given degree of distention. Stomach/Bowel: Stomach is within normal limits. The appendix is not directly visualized, however there are no pericecal inflammatory changes. No evidence of bowel wall thickening, distention, or inflammatory changes. Vascular/Lymphatic: Aortic atherosclerosis. Severe calcific atherosclerosis at the origins of the renal artery and throughout the splenic artery. No enlarged abdominal or pelvic lymph nodes. Reproductive: Status post hysterectomy. A 1.4 cm benign right ovarian cyst is noted; no additional dedicated follow-up imaging is indicated. Other: No abdominal wall hernia or abnormality. No abdominopelvic ascites. Musculoskeletal: Compression fracture of the T12 vertebral body with approximately 50% vertebral height loss, new compared to chest radiograph 10/18/2021. No suspicious osseous findings. Bilateral pedicle  screw and rod fixation spans L1-L4 with intervertebral spacers at L2-L3 and L3-L4. IMPRESSION: 1. Mild fat stranding surrounding the tail of the pancreas, suggestive of acute pancreatitis. No peripancreatic fluid collection. Recommend correlation with lipase levels. 2. Compression fracture of the T12 vertebral body, with approximately 50% vertebral body height loss, of unknown chronicity. This finding is new since prior chest radiograph 10/18/2021. 3. Nonobstructive right nephrolithiasis. 4. Subtle centrilobular ground-glass opacities in the visualized lower lungs may reflect bronchiolitis or hypersensitivity pneumonitis. 5.  Aortic Atherosclerosis (ICD10-I70.0). Electronically Signed   By: Ileana Roup M.D.   On: 05/26/2022 15:07    Orson Eva, DO  [facility]  If 7PM-7AM, please contact  night-coverage [email or web address] Password [email or web address] 06/20/2022, 10:01 AM   LOS: 1 day

## 2022-06-20 NOTE — Progress Notes (Signed)
Initial Nutrition Assessment  DOCUMENTATION CODES:   Non-severe (moderate) malnutrition in context of chronic illness  INTERVENTION:  Boost Breeze po TID, each supplement provides 250 kcal and 9 grams of protein   ProSource 30 ml TID (each 30 ml provides 100 kcal, 15 gr protein)   NUTRITION DIAGNOSIS:   Moderate Malnutrition related to acute illness, chronic illness (acute on chronic pancreatitis and AKI/CKD-IV) as evidenced by energy intake < or equal to 75% for > or equal to 1 month, mild fat depletion, mild muscle depletion.   GOAL:  Patient will meet greater than or equal to 90% of their needs  MONITOR:  Diet advancement, Supplement acceptance, Labs, Weight trends, PO intake  REASON FOR ASSESSMENT:   Malnutrition Screening Tool    ASSESSMENT: Patient is a 71 yo with hx of CHF, B-12 deficiency, DM2, GERD, CKD-IV, Chron's dz, HTN, chronic pancreatitis, stroke who presents with abnormal labs and hypertension. Acute on chronic pancreatitis, and AKI.   Patient intake reported as poor prior to admission. Patient reports "liquid diet" at home due to the sight of food nauseates her. She has been drinking 3-4 Glucerna or Boost and likes gingerale. Her lunch tray is here untouched. She has drank 100% of a ONS and nursing has brought in a gingerale. If MD agrees -consider pancreatic enzymes with meals and supplements.   Weights: significant weight loss per hospital records review 7 kg (11%) x 3 months.   IVF- NS'@150'$  ml/hr  Medications: insulin, Keppra, calcitriol, lokelma.       Latest Ref Rng & Units 06/20/2022   10:25 AM 06/20/2022    3:01 AM 06/19/2022    9:43 PM  BMP  Glucose 70 - 99 mg/dL 101  110    BUN 8 - 23 mg/dL 56  69    Creatinine 0.44 - 1.00 mg/dL 2.99  3.57    Sodium 135 - 145 mmol/L 137  134    Potassium 3.5 - 5.1 mmol/L 4.9  5.7  7.1   Chloride 98 - 111 mmol/L 113  103    CO2 22 - 32 mmol/L 19  21    Calcium 8.9 - 10.3 mg/dL 7.0  9.0       NUTRITION -  FOCUSED PHYSICAL EXAM:  Flowsheet Row Most Recent Value  Orbital Region Mild depletion  Upper Arm Region No depletion  Thoracic and Lumbar Region No depletion  Buccal Region Mild depletion  Temple Region Mild depletion  Clavicle Bone Region Mild depletion  Clavicle and Acromion Bone Region Moderate depletion  Scapular Bone Region No depletion  Dorsal Hand No depletion  Patellar Region Mild depletion  Anterior Thigh Region Mild depletion  Posterior Calf Region No depletion  Edema (RD Assessment) None  Hair Reviewed  Eyes Reviewed  Mouth Reviewed  Skin Reviewed  Nails Reviewed       Diet Order:   Diet Order             Diet clear liquid Room service appropriate? Yes; Fluid consistency: Thin  Diet effective now                   EDUCATION NEEDS:  Education needs have been addressed  Skin:  Skin Assessment: Reviewed RN Assessment (redness to sacrum)  Last BM:  8/21  Height:   Ht Readings from Last 1 Encounters:  06/20/22 '5\' 3"'$  (1.6 m)    Weight:   Wt Readings from Last 1 Encounters:  06/20/22 54.7 kg    Ideal Body Weight:  52 kg  BMI:  Body mass index is 21.36 kg/m.  Estimated Nutritional Needs:   Kcal:  1600-1700  Protein:  70-75 gr  Fluid:  1600-1700 ml daily  Colman Cater MS,RD,CSG,LDN Contact; Shea Evans

## 2022-06-20 NOTE — ED Provider Notes (Signed)
[facility] EMERGENCY DEPARTMENT Provider Note  CSN: [id number] Arrival date & time: 06/19/22 1731  Chief Complaint(s) Abnormal Labs and Hypertension  HPI Heather [name] is a 71 y.o. female with history of CKD, CHF, Crohn's disease presenting to the emergency department with abnormal lab.  Patient had outpatient labs today demonstrating a potassium of 7.4 and was sent to the emergency department.  She reports for the past 3 to 4 days she has had generalized weakness, nausea, vomiting.  Also reports flank pain on the left.  Reports her symptoms are constant.  Denies syncope, chest pain.  Denies fevers or chills.  Denies shortness of breath or leg swelling.  She reports that symptoms are constant.  Denies similar symptoms in the past.  Reports compliance with home medications.  Reports she is still urinating normally.   Past Medical History Past Medical History:  Diagnosis Date   Acute renal failure superimposed on stage 3 chronic kidney disease ([place]) 01/21/2016   Anemia    Anxiety    B12 deficiency    Brain tumor (benign) ([place]) 10/29/2020   Cellulitis and abscess    Abdomen and buttocks   Chronic diastolic CHF (congestive heart failure) ([place]) 03/16/2022   CKD (chronic kidney disease) stage 3, GFR 30-59 ml/min (HCC)    Closed fracture of distal end of femur, unspecified fracture morphology, initial encounter ([place])    Coronary arteriosclerosis 07/15/2018   Crohn disease ([place])    Depression    Diabetic neuropathy (HCC)    feet   DJD (degenerative joint disease)    Right forminal stenosis C4-5   Essential hypertension    Femur fracture, right ([place]) 73/41/9379   Folliculitis    GERD (gastroesophageal reflux disease)    Gout    Headache(784.0)    Hiatal hernia    History of blood transfusion    History of bronchitis    History of cardiac catheterization    No significant CAD 2012   History of kidney stones    surgery to remove   Insomnia    Irritable bowel syndrome     Mixed hyperlipidemia    Osteoarthritis    Palpitations    Pneumonia    Reflux esophagitis    Salmonella    Stroke ([place])    Tinnitus    Right   Type 2 diabetes mellitus ([place])    Patient Active Problem List   Diagnosis Date Noted   Hyperkalemia 06/19/2022   Type 2 diabetes mellitus with hyperosmolar nonketotic hyperglycemia ([place]) 05/27/2022   Type 2 diabetes mellitus with hyperosmolar hyperglycemic state (HHS) ([place]) 05/26/2022   Acute kidney failure ([place]) 03/17/2022   Mixed diabetic hyperlipidemia associated with type 2 diabetes mellitus ([place]) 03/16/2022   Chronic diastolic CHF (congestive heart failure) ([place]) 03/16/2022   Decubitus ulcer of left buttock, stage 2 ([place]) 03/16/2022   Cellulitis of left hand 11/30/2021   Closed fracture of part of upper end of humerus 11/30/2021   At risk for falls 11/28/2021   Neoplasm of meninges 11/28/2021   Sepsis ([place]) 11/28/2021   Acute on chronic anemia 11/28/2021   Cerebral thrombosis with cerebral infarction 08/28/2021   Cerebrovascular accident (CVA) due to embolism of cerebral artery ([place])    Pressure injury of skin 08/26/2021   Acute respiratory failure with hypoxia ([place]) 02/40/9735   Acute metabolic encephalopathy 32/99/2426   Malignant hypertension 08/25/2021   Thrombocytopenia ([place]) 08/25/2021   Anemia due to chronic kidney disease 08/25/2021   Blurry vision, bilateral 08/16/2021   Hyponatremia  04/01/2021   COVID-19 03/30/2021   Senile osteoporosis 02/10/2021   Acute pancreatitis    Macrocytic anemia    E coli enteritis 01/09/2021   Acute on chronic pancreatitis ([place]) 01/08/2021   Acute renal failure superimposed on stage 3b chronic kidney disease ([place]) 01/07/2021   Diarrhea    Acute hypokalemia 01/06/2021   Long-term use of immunosuppressant medication-anticipated 11/28/2020   Elevated blood-pressure reading, without diagnosis of hypertension 05/30/2020   Meningioma ([place]) 05/30/2020   Headache 05/17/2020   Hypokalemia  05/17/2020   Hypomagnesemia 05/17/2020   Dehydration 05/17/2020   Hypertensive urgency 05/17/2020   Diplopia 05/17/2020   Left hip pain 09/22/2019   Lumbar radiculopathy 09/22/2019   Postoperative anemia due to acute blood loss 09/18/2019   Postoperative urinary retention 09/18/2019   Closed fracture of distal end of radius 05/26/2019   Coronary arteriosclerosis 07/15/2018   Closed right hip fracture ([place]) 04/08/2018   Elevated lipase    Crohn's disease of small intestine with other complication ([place])    Protein-calorie malnutrition, severe 03/05/2018   Neck pain 03/12/2017   Acute kidney injury superimposed on CKD ([place]) 01/25/2016   Neuropathy 03/02/2015   Pain in the chest    Chest pain 12/22/2014   Essential hypertension 12/22/2014   Uncontrolled type 2 diabetes mellitus with hypoglycemia, with long-term current use of insulin ([place]) 12/22/2014   Hiatal hernia 12/22/2014   GERD (gastroesophageal reflux disease) 12/22/2014   Gout 12/22/2014   Mixed hyperlipidemia 12/22/2014   Depression 12/22/2014   Type 2 diabetes mellitus without complications ([place]) 28/41/3244   Fusion of spine, cervicothoracic region 11/01/2014   Pseudophakia, left eye 10/20/2014   Chronic kidney disease (CKD), stage IV (severe) ([place]) 08/25/2014   Cervical stenosis of spine 08/26/2013   Vitamin B12 deficiency 07/08/2011   Personal history of failed moderate sedation 04/16/2011   Chronic Nausea and Vomiting - ? gastroapresis 04/16/2011   Early satiety 04/16/2011   Irritable bowel syndrome 04/16/2011   Benign paroxysmal positional vertigo 11/20/2010   TRICUSPID REGURGITATION, MODERATE 11/20/2010   PULMONARY HYPERTENSION, MILD 11/20/2010   LEG EDEMA, BILATERAL 11/20/2010   DYSPNEA 11/20/2010   NONSPECIFIC ABNORMAL ELECTROCARDIOGRAM 11/20/2010   CHEST WALL PAIN, HX OF 11/20/2010   Home Medication(s) Prior to Admission medications   Medication Sig Start Date End Date Taking? Authorizing Provider   acetaminophen (TYLENOL) 325 MG tablet Take 2 tablets (650 mg total) by mouth every 4 (four) hours. 12/01/21   Johnson, Clanford L, MD  allopurinol (ZYLOPRIM) 100 MG tablet Take 100 mg by mouth daily. 03/20/21   [provider]  Ascorbic Acid (VITAMIN C WITH ROSE HIPS) 500 MG tablet Take 500 mg by mouth every morning.    [provider]  atorvastatin (LIPITOR) 80 MG tablet Take 80 mg by mouth daily. 01/03/22   [provider]  busPIRone (BUSPAR) 10 MG tablet Take 10 mg by mouth 3 (three) times daily as needed for anxiety. 03/04/22   [provider]  calcitRIOL (ROCALTROL) 0.25 MCG capsule Take 0.25 mcg by mouth 3 (three) times a week. Monday,Wed and Fri. 10/05/21   [provider]  calcium carbonate (OS-CAL) 1250 (500 Ca) MG chewable tablet Chew 1 tablet by mouth 2 (two) times daily. 10/04/21 10/04/22  [provider]  Cholecalciferol (VITAMIN D) 2000 units CAPS Take 2,000 Units by mouth every morning.     [provider]  cyanocobalamin (,VITAMIN B-12,) 1000 MCG/ML injection Inject 1,000 mcg into the muscle every 30 (thirty) days.  [provider]  diazepam (VALIUM) 5 MG tablet Take 1 tablet (5 mg total) by mouth every 12 (twelve) hours as needed for anxiety. 12/01/21   Johnson, Clanford L, MD  dicyclomine (BENTYL) 20 MG tablet Take 1 tablet (20 mg total) by mouth in the morning and at bedtime. 12/01/21   Johnson, Clanford L, MD  diphenhydrAMINE (BENADRYL) 2 % cream Apply topically 3 (three) times daily as needed for itching. 12/01/21   Johnson, Clanford L, MD  esomeprazole (NEXIUM) 40 MG capsule Take 40 mg by mouth daily before breakfast.      [provider]  furosemide (LASIX) 40 MG tablet Take 40 mg by mouth 2 (two) times daily.    [provider]  gabapentin (NEURONTIN) 300 MG capsule Take 1 capsule (300 mg total) by mouth at bedtime. 05/27/22   Roxan Hockey, MD  hydrALAZINE (APRESOLINE) 25 MG tablet Take 25 mg by  mouth in the morning and at bedtime.    [provider]  insulin aspart (NOVOLOG) 100 UNIT/ML FlexPen Inject 0-12 Units into the skin 3 (three) times daily with meals. insulin aspart (novoLOG) injection 0-12 Units 0-12 Units Subcutaneous, 3 times daily with meals CBG < 70: Implement Hypoglycemia Standing Orders and refer to Hypoglycemia Standing Orders sidebar report  CBG 70 - 120: 0 unit CBG 121 - 150: 0 unit  CBG 151 - 200: 2 unit CBG 201 - 250: 4 units CBG 251 - 300: 6 units CBG 301 - 350: 8 units  CBG 351 - 400: 10 units  CBG > 400: 12 units 05/27/22   Emokpae, [name], MD  insulin detemir (LEVEMIR FLEXPEN) 100 UNIT/ML FlexPen Inject 10 Units into the skin at bedtime. 04/25/22   Brita Romp, NP  levETIRAcetam (KEPPRA) 500 MG tablet Take 500 mg by mouth 2 (two) times daily.    [provider]  metoprolol succinate (TOPROL XL) 100 MG 24 hr tablet Take 1 tablet (100 mg total) by mouth daily. Take with or immediately following a meal. 05/27/22 05/27/23  Emokpae, [name], MD  polyethylene glycol (MIRALAX / GLYCOLAX) 17 g packet Take 17 g by mouth daily. Patient taking differently: Take 17 g by mouth daily as needed for moderate constipation. 12/02/21   Murlean Iba, MD  potassium chloride SA (KLOR-CON M) 20 MEQ tablet Take 1 tablet (20 mEq total) by mouth every Monday, Wednesday, and Friday. Take while taking Lasix/Furosemide 05/28/22   Roxan Hockey, MD  sodium bicarbonate 650 MG tablet Take 2 tablets (1,300 mg total) by mouth 2 (two) times daily. For kidneys 05/27/22 05/27/23  Roxan Hockey, MD                                                                                                                                    Past Surgical History Past Surgical History:  Procedure Laterality Date   BACK SURGERY     c-spine surgery  03/2007   CARDIAC CATHETERIZATION     CHOLECYSTECTOMY     COLONOSCOPY  04/21/2011; 05/29/11   6/12: [place] - ?AVM at IC valve,  inflammatory changes at IC valve [facility]); 7/12 - [facility]; IC valve erosions, look chronic and probably Crohn's per path   colonoscopy  2017   [place]: random biopsies normal. TI normal   ESOPHAGOGASTRODUODENOSCOPY  05/29/11   normal   ESOPHAGOGASTRODUODENOSCOPY N/A 01/21/2016   Dr. Michail Sermon: normal   EYE SURGERY Bilateral    cataracts removed   FEMUR IM NAIL Right 04/10/2018   Procedure: INTRAMEDULLARY (IM) NAIL FEMORAL;  Surgeon: Paralee Cancel, MD;  Location: [facility];  Service: Orthopedics;  Laterality: Right;   FRACTURE SURGERY     GIVENS CAPSULE STUDY  11/17/2020   HARDWARE REMOVAL Right 04/10/2018   Procedure: HARDWARE REMOVAL RIGHT DISTAL FEMUR;  Surgeon: Paralee Cancel, MD;  Location: [facility];  Service: Orthopedics;  Laterality: Right;   HERNIA REPAIR     IR FLUORO GUIDE CV LINE RIGHT  04/01/2017   IR US GUIDE VASC ACCESS RIGHT  04/01/2017   JOINT REPLACEMENT Right    hip   OPEN REDUCTION INTERNAL FIXATION (ORIF) DISTAL RADIAL FRACTURE Right 05/28/2019   Procedure: OPEN REDUCTION INTERNAL FIXATION (ORIF) DISTAL RADIAL FRACTURE;  Surgeon: Verner Mould, MD;  Location: [facility];  Service: Orthopedics;  Laterality: Right;  with block 90 minutes   ORIF FEMUR FRACTURE Right 03/31/2017   Procedure: OPEN REDUCTION INTERNAL FIXATION (ORIF) DISTAL FEMUR FRACTURE;  Surgeon: Paralee Cancel, MD;  Location: [facility];  Service: Orthopedics;  Laterality: Right;   removal of kidney stone     Right knee arthroscopic surgery     TONSILLECTOMY     TOTAL ABDOMINAL HYSTERECTOMY     Family History Family History  Problem Relation Age of Onset   Diabetes Mother    Colon cancer Brother        POSSIBLY. Patient states diagnosed at age 65, then later states age 51. unclear and limited historian   Esophageal cancer Neg Hx    Rectal cancer Neg Hx    Stomach cancer Neg Hx     Social History Social History   Tobacco Use   Smoking status: Never   Smokeless tobacco: Never  Vaping Use    Vaping Use: Never used  Substance Use Topics   Alcohol use: No   Drug use: No   Allergies Cozaar [losartan], Quinolones, Cymbalta [duloxetine hcl], Sulfa antibiotics, Synalar [fluocinolone], Cipro [ciprofloxacin hcl], Diovan [valsartan], Glucophage [metformin], Ketalar [ketamine], Norco [hydrocodone-acetaminophen], Norvasc [amlodipine besylate], Nsaids, Tylenol [acetaminophen], Tape, and Zestril [lisinopril]  Review of Systems Review of Systems  All other systems reviewed and are negative.   Physical Exam Vital Signs  I have reviewed the triage vital signs BP 120/66   Pulse 97   Temp 98 F (36.7 C) (Oral)   Resp 13   Ht '5\' 2"'$  (1.575 m)   Wt 53.1 kg   LMP  (LMP Unknown)   SpO2 100%   BMI 21.40 kg/m  Physical Exam Vitals and nursing note reviewed.  Constitutional:      General: She is not in acute distress.    Appearance: She is well-developed.  HENT:     Head: Normocephalic and atraumatic.     Mouth/Throat:     Mouth: Mucous membranes are moist.  Eyes:     Pupils: Pupils are equal, round, and reactive to light.  Cardiovascular:     Rate and Rhythm: Normal rate and  regular rhythm.     Heart sounds: No murmur heard. Pulmonary:     Effort: Pulmonary effort is normal. No respiratory distress.     Breath sounds: Normal breath sounds.  Abdominal:     General: Abdomen is flat.     Palpations: Abdomen is soft.     Tenderness: There is no abdominal tenderness. There is right CVA tenderness and left CVA tenderness.  Musculoskeletal:        General: No tenderness.     Right lower leg: No edema.     Left lower leg: No edema.  Skin:    General: Skin is warm and dry.  Neurological:     General: No focal deficit present.     Mental Status: She is alert. Mental status is at baseline.  Psychiatric:        Mood and Affect: Mood normal.        Behavior: Behavior normal.     ED Results and Treatments Labs (all labs ordered are listed, but only abnormal results are  displayed) Labs Reviewed  BASIC METABOLIC PANEL - Abnormal; Notable for the following components:      Result Value   Sodium 128 (*)    Potassium 6.8 (*)    CO2 20 (*)    Glucose, Bld 493 (*)    BUN 70 (*)    Creatinine, Ser 3.77 (*)    GFR, Estimated 12 (*)    All other components within normal limits  CBC - Abnormal; Notable for the following components:   RBC 2.71 (*)    Hemoglobin 8.7 (*)    HCT 26.4 (*)    All other components within normal limits  URINALYSIS, ROUTINE W REFLEX MICROSCOPIC - Abnormal; Notable for the following components:   APPearance HAZY (*)    Glucose, UA >=500 (*)    Protein, ur 100 (*)    Leukocytes,Ua SMALL (*)    WBC, UA >50 (*)    Bacteria, UA RARE (*)    All other components within normal limits  POTASSIUM - Abnormal; Notable for the following components:   Potassium 7.1 (*)    All other components within normal limits  LIPASE, BLOOD - Abnormal; Notable for the following components:   Lipase 96 (*)    All other components within normal limits  CBG MONITORING, ED - Abnormal; Notable for the following components:   Glucose-Capillary 386 (*)    All other components within normal limits  URINE CULTURE  TROPONIN I (HIGH SENSITIVITY)  TROPONIN I (HIGH SENSITIVITY)                                                                                                                          Radiology DG Chest 2 View  Result Date: 06/19/2022 CLINICAL DATA:  Chest pain EXAM: CHEST - 2 VIEW COMPARISON:  Radiographs 03/08/2022 FINDINGS: No focal consolidation, pleural effusion, or pneumothorax. Normal cardiomediastinal silhouette. No acute osseous abnormality. Chronic displaced fracture of the proximal  right humerus. Aortic calcification. Cervical spine fusion hardware. IMPRESSION: No active cardiopulmonary disease. Electronically Signed   By: Placido Sou M.D.   On: 06/19/2022 20:25   CT ABDOMEN PELVIS WO CONTRAST  Result Date: 06/19/2022 CLINICAL DATA:   Abdominal pain. EXAM: CT ABDOMEN AND PELVIS WITHOUT CONTRAST TECHNIQUE: Multidetector CT imaging of the abdomen and pelvis was performed following the standard protocol without IV contrast. RADIATION DOSE REDUCTION: This exam was performed according to the departmental dose-optimization program which includes automated exposure control, adjustment of the mA and/or kV according to patient size and/or use of iterative reconstruction technique. COMPARISON:  CT abdomen pelvis dated 05/26/2022. FINDINGS: Evaluation of this exam is limited in the absence of intravenous contrast. Lower chest: The visualized lung bases are clear. There is coronary vascular calcification and calcification of the mitral annulus. No intra-abdominal free air or free fluid. Hepatobiliary: The liver is unremarkable. No biliary dilatation. Cholecystectomy. No retained calcified stone noted in the central CBD. Pancreas: There is infiltration of the fat surrounding the distal body and tail of the pancreas with loss of fat plane between the pancreas and stomach similar to prior CT. Findings most consistent with scarring and adhesions sequela of chronic pancreatitis. Correlation with pancreatic enzymes recommended to evaluate for possibility of acute on chronic pancreatitis. No drainable fluid collection, abscess, or pseudocyst. Spleen: Normal in size without focal abnormality. Adrenals/Urinary Tract: The adrenal glands unremarkable. Mild bilateral renal parenchyma atrophy. There is a 2 mm nonobstructing right renal mid pole calculus. No hydronephrosis on either side. Subcentimeter exophytic left renal interpolar hypodense lesion is too small to characterize but appears similar to prior CT, likely a cyst. The visualized ureters and urinary bladder appear unremarkable. Stomach/Bowel: There is moderate stool throughout the colon. There is no bowel obstruction or active inflammation. The appendix is not visualized with certainty. No inflammatory changes  identified in the right lower quadrant. Vascular/Lymphatic: Advanced aortoiliac atherosclerotic disease. The IVC is unremarkable. No portal venous gas. There is no adenopathy. Reproductive: Hysterectomy.  No adnexal masses. Other: None Musculoskeletal: There is osteopenia with degenerative changes spine and multilevel posterior fusion hardware. Similar appearance of T12 compression fracture as the prior CT. No new fracture. IMPRESSION: 1. No bowel obstruction. 2. A 2 mm nonobstructing right renal mid pole calculus. No hydronephrosis. 3. Infiltration of the fat surrounding the distal body and tail of the pancreas with loss of fat plane between the pancreas and stomach similar to prior CT. Findings most consistent with scarring and adhesions sequela of chronic pancreatitis. Correlation with pancreatic enzymes recommended to evaluate for possibility of acute on chronic pancreatitis. No drainable fluid collection, abscess, or pseudocyst. 4. T12 compression fracture similar to prior CT. 5. Aortic Atherosclerosis (ICD10-I70.0). Electronically Signed   By: Anner Crete M.D.   On: 06/19/2022 20:23    Pertinent labs & imaging results that were available during my care of the patient were reviewed by me and considered in my medical decision making (see MDM for details).  Medications Ordered in ED Medications  sodium zirconium cyclosilicate (LOKELMA) packet 10 g (10 g Oral Given 06/19/22 2329)  calcium gluconate inj 10% (1 g) URGENT USE ONLY! (1 g Intravenous Given 06/19/22 1830)  insulin aspart (novoLOG) injection 5 Units (5 Units Intravenous Given 06/19/22 1831)    And  dextrose 50 % solution 50 mL (50 mLs Intravenous Given 06/19/22 1832)  furosemide (LASIX) injection 40 mg (40 mg Intravenous Given 06/19/22 1830)  sodium zirconium cyclosilicate (LOKELMA) packet 10 g (10 g Oral  Given 06/19/22 1830)  ondansetron (ZOFRAN) injection 4 mg (4 mg Intravenous Given 06/19/22 1857)  morphine (PF) 4 MG/ML injection 4 mg (4  mg Intravenous Given 06/19/22 1857)  cefTRIAXone (ROCEPHIN) 1 g in sodium chloride 0.9 % 100 mL IVPB (0 g Intravenous Stopped 06/19/22 2315)  furosemide (LASIX) injection 80 mg (80 mg Intravenous Given 06/19/22 2329)  albuterol (PROVENTIL) (2.5 MG/3ML) 0.083% nebulizer solution 10 mg (10 mg Nebulization Given 06/19/22 2346)                                                                                                                                     Procedures .Critical Care  Performed by: Cristie Hem, MD Authorized by: Cristie Hem, MD   Critical care provider statement:    Critical care time (minutes):  30   Critical care was necessary to treat or prevent imminent or life-threatening deterioration of the following conditions:  Renal failure, dehydration, shock and cardiac failure   Critical care was time spent personally by me on the following activities:  Development of treatment plan with patient or surrogate, discussions with consultants, evaluation of patient's response to treatment, examination of patient, ordering and review of laboratory studies, ordering and review of radiographic studies, ordering and performing treatments and interventions, pulse oximetry, re-evaluation of patient's condition and review of old charts   Care discussed with: admitting provider     (including critical care time)  Medical Decision Making / ED Course   MDM:  71 year old female presenting to the emergency department with abnormal lab.  EKG notable for mild peaked T waves, but no bradycardia or QRS widening.  Review of old labs demonstrates potassium greater than 7.5.  Patient seen and treated with medications for hyperkalemia given elevated potassium level.  Labs also notable for AKI.  Given fluids, patient euvolemic appearing.  Despite treatment, patient had continued elevated potassium to 7.1 on recheck.  Discussed with nephrologist who recommends admission to the hospital.  Given  flank pain, CT performed without evidence of acute intra-abdominal process, signs of chronic pancreatitis are present but low concern without epigastric tenderness.  Urinalysis concerning for urinary infection so started on ceftriaxone.  Discussed with the hospitalist will admit.  Nephrologist feels that the patient does not currently need emergent dialysis given hemodynamic stability,, they will continue to treat her hyperkalemia and monitor overnight.      Additional history obtained: -Additional history obtained from husband -External records from outside source obtained and reviewed including: Chart review including previous notes, labs, imaging, consultation notes   Lab Tests: -I ordered, reviewed, and interpreted labs.   The pertinent results include:   Labs Reviewed  BASIC METABOLIC PANEL - Abnormal; Notable for the following components:      Result Value   Sodium 128 (*)    Potassium 6.8 (*)    CO2 20 (*)    Glucose, Bld 493 (*)  BUN 70 (*)    Creatinine, Ser 3.77 (*)    GFR, Estimated 12 (*)    All other components within normal limits  CBC - Abnormal; Notable for the following components:   RBC 2.71 (*)    Hemoglobin 8.7 (*)    HCT 26.4 (*)    All other components within normal limits  URINALYSIS, ROUTINE W REFLEX MICROSCOPIC - Abnormal; Notable for the following components:   APPearance HAZY (*)    Glucose, UA >=500 (*)    Protein, ur 100 (*)    Leukocytes,Ua SMALL (*)    WBC, UA >50 (*)    Bacteria, UA RARE (*)    All other components within normal limits  POTASSIUM - Abnormal; Notable for the following components:   Potassium 7.1 (*)    All other components within normal limits  LIPASE, BLOOD - Abnormal; Notable for the following components:   Lipase 96 (*)    All other components within normal limits  CBG MONITORING, ED - Abnormal; Notable for the following components:   Glucose-Capillary 386 (*)    All other components within normal limits  URINE CULTURE   TROPONIN I (HIGH SENSITIVITY)  TROPONIN I (HIGH SENSITIVITY)      EKG   EKG Interpretation  Date/Time:  Tuesday June 19 2022 17:45:21 EDT Ventricular Rate:  97 PR Interval:  194 QRS Duration: 96 QT Interval:  340 QTC Calculation: 431 R Axis:   20 Text Interpretation: Normal sinus rhythm Left ventricular hypertrophy with repolarization abnormality ( R in aVL ) Abnormal ECG When compared with ECG of 16-Mar-2022 04:05, Inverted T waves have replaced nonspecific T wave abnormality in Lateral leads Confirmed by Garnette Gunner [phone]) on 06/19/2022 5:48:49 PM         Imaging Studies ordered: I ordered imaging studies including CT KUB, CXR I independently visualized and interpreted imaging. I agree with the radiologist interpretation   Medicines ordered and prescription drug management: Meds ordered this encounter  Medications   calcium gluconate inj 10% (1 g) URGENT USE ONLY!   AND Linked Order Group    insulin aspart (novoLOG) injection 5 Units    dextrose 50 % solution 50 mL   furosemide (LASIX) injection 40 mg   sodium zirconium cyclosilicate (LOKELMA) packet 10 g   ondansetron (ZOFRAN) injection 4 mg   morphine (PF) 4 MG/ML injection 4 mg   cefTRIAXone (ROCEPHIN) 1 g in sodium chloride 0.9 % 100 mL IVPB    Order Specific Question:   Antibiotic Indication:    Answer:   UTI   furosemide (LASIX) injection 80 mg   DISCONTD: insulin aspart (novoLOG) injection 5 Units   DISCONTD: dextrose 50 % solution 50 mL   albuterol (PROVENTIL) (2.5 MG/3ML) 0.083% nebulizer solution 10 mg   sodium zirconium cyclosilicate (LOKELMA) packet 10 g    -I have reviewed the patients home medicines and have made adjustments as needed    Consultations Obtained: I requested consultation with the nephrologist,  and discussed lab and imaging findings as well as pertinent plan - they recommend: admission   Cardiac Monitoring: The patient was maintained on a cardiac monitor.  I personally  viewed and interpreted the cardiac monitored which showed an underlying rhythm of: NSR    Reevaluation: After the interventions noted above, I reevaluated the patient and found that they have :improved  Co morbidities that complicate the patient evaluation  Past Medical History:  Diagnosis Date   Acute renal failure superimposed on stage 3 chronic  kidney disease ([place]) 01/21/2016   Anemia    Anxiety    B12 deficiency    Brain tumor (benign) ([place]) 10/29/2020   Cellulitis and abscess    Abdomen and buttocks   Chronic diastolic CHF (congestive heart failure) ([place]) 03/16/2022   CKD (chronic kidney disease) stage 3, GFR 30-59 ml/min (HCC)    Closed fracture of distal end of femur, unspecified fracture morphology, initial encounter ([place])    Coronary arteriosclerosis 07/15/2018   Crohn disease ([place])    Depression    Diabetic neuropathy (HCC)    feet   DJD (degenerative joint disease)    Right forminal stenosis C4-5   Essential hypertension    Femur fracture, right ([place]) 45/12/8880   Folliculitis    GERD (gastroesophageal reflux disease)    Gout    Headache(784.0)    Hiatal hernia    History of blood transfusion    History of bronchitis    History of cardiac catheterization    No significant CAD 2012   History of kidney stones    surgery to remove   Insomnia    Irritable bowel syndrome    Mixed hyperlipidemia    Osteoarthritis    Palpitations    Pneumonia    Reflux esophagitis    Salmonella    Stroke (HCC)    Tinnitus    Right   Type 2 diabetes mellitus ([place])       Dispostion: Admit     Final Clinical Impression(s) / ED Diagnoses Final diagnoses:  Hyperkalemia  AKI (acute kidney injury) ([place])  Bacterial urinary infection     This chart was dictated using voice recognition software.  Despite best efforts to proofread,  errors can occur which can change the documentation meaning.    Cristie Hem, MD 06/20/22 [phone]

## 2022-06-20 NOTE — Assessment & Plan Note (Signed)
-   Creatinine increased from 2.3>> 3.77 -BUN 70, creatinine 3.77 >> likely dehydration - 500 mL bolus given in the ED with Lasix - Continue IV hydration throughout the night - Avoid nephrotoxic agents when possible - Nephrology consulted from the ED, and will see this patient - Continue to monitor

## 2022-06-20 NOTE — TOC Initial Note (Addendum)
Transition of Care [facility]) - Initial/Assessment Note    Patient Details  Name: Heather [name] MRN: [id number] Date of Birth: 1951-03-20  [facility] CM/SW Contact:    Boneta Lucks, RN Phone Number: 06/20/2022, 2:55 PM  Clinical Narrative:     Patient admitted with  hyperkalemia. Has used [facility] in the past. Not current with any home health.PT eval pending. TOC to follow for discharge needs. DC planning for  Friday.               Expected Discharge Plan: [place] Barriers to Discharge: Continued Medical Work up   Patient Goals and CMS Choice    Expected Discharge Plan and Services Expected Discharge Plan: [place] arrangements for the past 2 months: Single Family Home                     Prior Living Arrangements/Services Living arrangements for the past 2 months: Single Family Home Lives with:: Spouse           Activities of Daily Living Home Assistive Devices/Equipment: None ADL Screening (condition at time of admission) Patient's cognitive ability adequate to safely complete daily activities?: Yes Is the patient deaf or have difficulty hearing?: No Does the patient have difficulty seeing, even when wearing glasses/contacts?: Yes (pt having diplopia on this admission) Does the patient have difficulty concentrating, remembering, or making decisions?: No Patient able to express need for assistance with ADLs?: Yes Does the patient have difficulty dressing or bathing?: No Independently performs ADLs?: Yes (appropriate for developmental age) Does the patient have difficulty walking or climbing stairs?: No Weakness of Legs: Right Weakness of Arms/Hands: None  Permission Sought/Granted        Emotional Assessment        Admission diagnosis:  Hyperkalemia [E87.5] AKI (acute kidney injury) ([place]) [N17.9] Bacterial urinary infection [N39.0, A49.9] Patient Active Problem  List   Diagnosis Date Noted   Right leg weakness 06/20/2022   Anxiety 06/20/2022   UTI (urinary tract infection) 06/20/2022   Hyperkalemia 06/19/2022   Type 2 diabetes mellitus with hyperosmolar nonketotic hyperglycemia ([place]) 05/27/2022   Acute kidney failure ([place]) 03/17/2022   Mixed diabetic hyperlipidemia associated with type 2 diabetes mellitus ([place]) 03/16/2022   Chronic diastolic CHF (congestive heart failure) ([place]) 03/16/2022   Decubitus ulcer of left buttock, stage 2 ([place]) 03/16/2022   Cellulitis of left hand 11/30/2021   Closed fracture of part of upper end of humerus 11/30/2021   At risk for falls 11/28/2021   Neoplasm of meninges 11/28/2021   Sepsis ([place]) 11/28/2021   Acute on chronic anemia 11/28/2021   Cerebral thrombosis with cerebral infarction 08/28/2021   Cerebrovascular accident (CVA) due to embolism of cerebral artery ([place])    Pressure injury of skin 08/26/2021   Acute respiratory failure with hypoxia ([place]) 75/64/3329   Acute metabolic encephalopathy 51/88/4166   Malignant hypertension 08/25/2021   Thrombocytopenia ([place]) 08/25/2021   Anemia due to chronic kidney disease 08/25/2021   Blurry vision, bilateral 08/16/2021   Hyponatremia 04/01/2021   COVID-19 03/30/2021   Senile osteoporosis 02/10/2021   Acute pancreatitis    Macrocytic anemia    E coli enteritis 01/09/2021   Acute on chronic pancreatitis ([place]) 01/08/2021   Acute renal failure superimposed on stage 4 chronic kidney disease ([place]) 01/07/2021   Diarrhea    Acute hypokalemia 01/06/2021   Long-term use of immunosuppressant medication-anticipated  11/28/2020   Elevated blood-pressure reading, without diagnosis of hypertension 05/30/2020   Meningioma ([place]) 05/30/2020   Headache 05/17/2020   Hypokalemia 05/17/2020   Hypomagnesemia 05/17/2020   Dehydration 05/17/2020   Hypertensive urgency 05/17/2020   Diplopia 05/17/2020   Left hip pain 09/22/2019   Lumbar radiculopathy 09/22/2019   Postoperative  anemia due to acute blood loss 09/18/2019   Postoperative urinary retention 09/18/2019   Closed fracture of distal end of radius 05/26/2019   Coronary arteriosclerosis 07/15/2018   Closed right hip fracture ([place]) 04/08/2018   Elevated lipase    Crohn's disease of small intestine with other complication ([place])    Protein-calorie malnutrition, severe 03/05/2018   Neck pain 03/12/2017   Acute kidney injury superimposed on CKD ([place]) 01/25/2016   Neuropathy 03/02/2015   Pain in the chest    Chest pain 12/22/2014   Essential hypertension 12/22/2014   Uncontrolled type 2 diabetes mellitus with hypoglycemia, with long-term current use of insulin ([place]) 12/22/2014   Hiatal hernia 12/22/2014   GERD (gastroesophageal reflux disease) 12/22/2014   Gout 12/22/2014   Mixed hyperlipidemia 12/22/2014   Depression 12/22/2014   Type 2 diabetes mellitus without complications ([place]) 46/65/9935   Fusion of spine, cervicothoracic region 11/01/2014   Pseudophakia, left eye 10/20/2014   Chronic kidney disease (CKD), stage IV (severe) ([place]) 08/25/2014   Cervical stenosis of spine 08/26/2013   Vitamin B12 deficiency 07/08/2011   Personal history of failed moderate sedation 04/16/2011   Chronic Nausea and Vomiting - ? gastroapresis 04/16/2011   Early satiety 04/16/2011   Irritable bowel syndrome 04/16/2011   Benign paroxysmal positional vertigo 11/20/2010   TRICUSPID REGURGITATION, MODERATE 11/20/2010   PULMONARY HYPERTENSION, MILD 11/20/2010   LEG EDEMA, BILATERAL 11/20/2010   DYSPNEA 11/20/2010   NONSPECIFIC ABNORMAL ELECTROCARDIOGRAM 11/20/2010   CHEST WALL PAIN, HX OF 11/20/2010   PCP:  [name], MD Pharmacy:   [facility], [place] [address] Phone: [phone] Fax: [facility] [address], [place] [address] Phone: [phone] Fax: [phone]   Readmission Risk  Interventions    06/20/2022    2:55 PM 03/16/2022    5:01 PM 11/29/2021    2:37 PM  Readmission Risk Prevention Plan  Transportation Screening Complete Complete Complete  HRI or Home Care Consult  Complete Complete  Social Work Consult for [place] Planning/Counseling  Complete Complete  Palliative Care Screening  Not Applicable Not Applicable  Medication Review Press photographer) Complete Complete Complete  PCP or Specialist appointment within 3-5 days of discharge Not Complete    SW Recovery Care/Counseling Consult Complete    Palliative Care Screening Not [place] Not Applicable

## 2022-06-20 NOTE — H&P (Signed)
History and Physical    Patient: Heather [name] GGE:[id number] DOB: 06/23/1951 DOA: 06/19/2022 DOS: the patient was seen and examined on 06/20/2022 PCP: [name], MD  Patient coming from: Home  Chief Complaint:  Chief Complaint  Patient presents with   Abnormal Labs   Hypertension   HPI: Heather [name] is a 71 y.o. female with medical history significant of chronic kidney disease-she reports her nephrologist has talked to her several times about dialysis, B12 deficiency, benign brain tumor, chronic diastolic CHF, Crohn's disease, depression, diabetic neuropathy, hypertension, GERD, mixed hyperlipidemia, stroke, type 2 diabetes mellitus, and more presents the ED with a chief complaint of abnormal labs.  Patient reports that she saw her PCP for routine checkup where they did blood work.  She was called and told to go to the emergency room after that.  Patient also complains of pancreatic pain, hyperglycemia, and right leg weakness.  Patient reports that her abdominal pain feels like a knife in her left lower abdomen.  Its been there for 3 days.  Is been worse since it started.  Nothing makes it better including the pain medication given to her in the ED.  She has not had diarrhea or constipation.  Patient has a decreased appetite and very poor p.o. intake per her report.  Patient reports her glucose checks have been high for 1 week.  The meter reads high.  She has been compliant with her 10 units of insulin nightly.  She does not think her diet has any significant changes recently.  Worth noting that her last hemoglobin A1c was actually a little too low.  Patient denies chest pain but does report palpitations.  The palpitations happen when she was trying to walk, but she has not been able to walk because of that right lower extremity weakness.  She reports her life lower extremity is also painful.  It feels like knives.  It is worse with standing.  She reports this weakness was sudden in  onset.  Patient complains of blurry vision has been going on for some time documented in the chart even before she remembers having it.  She does not have any dysphagia or aphasia.  2 days ago she started having a pressure ulcer over her sacrum.  She reports that it has been bleeding so she has been wearing a bandage on it.  Has not had any purulent or malodorous drainage.  Patient has no other complaints at this time. Patient does not smoke, does not drink, does not use illicit drugs.  She is vaccinated for COVID.  Patient is full code. Review of Systems: As mentioned in the history of present illness. All other systems reviewed and are negative. Past Medical History:  Diagnosis Date   Acute renal failure superimposed on stage 3 chronic kidney disease ([place]) 01/21/2016   Anemia    Anxiety    B12 deficiency    Brain tumor (benign) ([place]) 10/29/2020   Cellulitis and abscess    Abdomen and buttocks   Chronic diastolic CHF (congestive heart failure) ([place]) 03/16/2022   CKD (chronic kidney disease) stage 3, GFR 30-59 ml/min (HCC)    Closed fracture of distal end of femur, unspecified fracture morphology, initial encounter ([place])    Coronary arteriosclerosis 07/15/2018   Crohn disease ([place])    Depression    Diabetic neuropathy (HCC)    feet   DJD (degenerative joint disease)    Right forminal stenosis C4-5   Essential hypertension    Femur fracture,  right ([place]) 40/07/2724   Folliculitis    GERD (gastroesophageal reflux disease)    Gout    Headache(784.0)    Hiatal hernia    History of blood transfusion    History of bronchitis    History of cardiac catheterization    No significant CAD 2012   History of kidney stones    surgery to remove   Insomnia    Irritable bowel syndrome    Mixed hyperlipidemia    Osteoarthritis    Palpitations    Pneumonia    Reflux esophagitis    Salmonella    Stroke [facility])    Tinnitus    Right   Type 2 diabetes mellitus [facility])    Past Surgical History:   Procedure Laterality Date   BACK SURGERY     c-spine surgery     03/2007   CARDIAC CATHETERIZATION     CHOLECYSTECTOMY     COLONOSCOPY  04/21/2011; 05/29/11   6/12: [place] - ?AVM at IC valve, inflammatory changes at IC valve [facility]); 7/12 - [facility]; IC valve erosions, look chronic and probably Crohn's per path   colonoscopy  2017   Learned: random biopsies normal. TI normal   ESOPHAGOGASTRODUODENOSCOPY  05/29/11   normal   ESOPHAGOGASTRODUODENOSCOPY N/A 01/21/2016   Dr. Michail Sermon: normal   EYE SURGERY Bilateral    cataracts removed   FEMUR IM NAIL Right 04/10/2018   Procedure: INTRAMEDULLARY (IM) NAIL FEMORAL;  Surgeon: Paralee Cancel, MD;  Location: [facility];  Service: Orthopedics;  Laterality: Right;   FRACTURE SURGERY     GIVENS CAPSULE STUDY  11/17/2020   HARDWARE REMOVAL Right 04/10/2018   Procedure: HARDWARE REMOVAL RIGHT DISTAL FEMUR;  Surgeon: Paralee Cancel, MD;  Location: [facility];  Service: Orthopedics;  Laterality: Right;   HERNIA REPAIR     IR FLUORO GUIDE CV LINE RIGHT  04/01/2017   IR US GUIDE VASC ACCESS RIGHT  04/01/2017   JOINT REPLACEMENT Right    hip   OPEN REDUCTION INTERNAL FIXATION (ORIF) DISTAL RADIAL FRACTURE Right 05/28/2019   Procedure: OPEN REDUCTION INTERNAL FIXATION (ORIF) DISTAL RADIAL FRACTURE;  Surgeon: Verner Mould, MD;  Location: [place];  Service: Orthopedics;  Laterality: Right;  with block 90 minutes   ORIF FEMUR FRACTURE Right 03/31/2017   Procedure: OPEN REDUCTION INTERNAL FIXATION (ORIF) DISTAL FEMUR FRACTURE;  Surgeon: Paralee Cancel, MD;  Location: [place];  Service: Orthopedics;  Laterality: Right;   removal of kidney stone     Right knee arthroscopic surgery     TONSILLECTOMY     TOTAL ABDOMINAL HYSTERECTOMY     Social History:  reports that she has never smoked. She has never used smokeless tobacco. She reports that she does not drink alcohol and does not use drugs.  Allergies  Allergen Reactions   Cozaar [Losartan] Nausea And  Vomiting, Swelling and Rash   Quinolones Itching, Nausea And Vomiting, Nausea Only and Swelling    Swelling of face, jaw, and lips    Cymbalta [Duloxetine Hcl] Itching and Swelling   Sulfa Antibiotics Nausea And Vomiting and Swelling    Headache  Swelling of feet, legs    Synalar [Fluocinolone] Swelling   Cipro [Ciprofloxacin Hcl] Nausea And Vomiting and Swelling    Swelling of face, jaw, lips   Diovan [Valsartan] Nausea And Vomiting and Swelling   Glucophage [Metformin] Other (See Comments)    GI upset   Ketalar [Ketamine] Swelling   Norco [Hydrocodone-Acetaminophen] Itching and Swelling   Norvasc [Amlodipine Besylate]  Itching and Swelling    Fluid retention    Nsaids Nausea And Vomiting and Other (See Comments)    Has bleeding ulcers   Tylenol [Acetaminophen] Itching and Swelling   Tape Itching and Rash    Paper tape can only be used on this patient   Zestril [Lisinopril] Swelling and Rash    Oral swelling Red streaks on arms/legs    Family History  Problem Relation Age of Onset   Diabetes Mother    Colon cancer Brother        POSSIBLY. Patient states diagnosed at age 28, then later states age 3. unclear and limited historian   Esophageal cancer Neg Hx    Rectal cancer Neg Hx    Stomach cancer Neg Hx     Prior to Admission medications   Medication Sig Start Date End Date Taking? Authorizing Provider  acetaminophen (TYLENOL) 325 MG tablet Take 2 tablets (650 mg total) by mouth every 4 (four) hours. 12/01/21   Johnson, Clanford L, MD  allopurinol (ZYLOPRIM) 100 MG tablet Take 100 mg by mouth daily. 03/20/21   [provider]  Ascorbic Acid (VITAMIN C WITH ROSE HIPS) 500 MG tablet Take 500 mg by mouth every morning.    [provider]  atorvastatin (LIPITOR) 80 MG tablet Take 80 mg by mouth daily. 01/03/22   [provider]  busPIRone (BUSPAR) 10 MG tablet Take 10 mg by mouth 3 (three) times daily as needed for anxiety. 03/04/22   [provider]  calcitRIOL (ROCALTROL) 0.25 MCG capsule Take 0.25 mcg by mouth 3 (three) times a week. Monday,Wed and Fri. 10/05/21   [provider]  calcium carbonate (OS-CAL) 1250 (500 Ca) MG chewable tablet Chew 1 tablet by mouth 2 (two) times daily. 10/04/21 10/04/22  [provider]  Cholecalciferol (VITAMIN D) 2000 units CAPS Take 2,000 Units by mouth every morning.     [provider]  cyanocobalamin (,VITAMIN B-12,) 1000 MCG/ML injection Inject 1,000 mcg into the muscle every 30 (thirty) days.      [provider]  diazepam (VALIUM) 5 MG tablet Take 1 tablet (5 mg total) by mouth every 12 (twelve) hours as needed for anxiety. 12/01/21   Johnson, Clanford L, MD  dicyclomine (BENTYL) 20 MG tablet Take 1 tablet (20 mg total) by mouth in the morning and at bedtime. 12/01/21   Johnson, Clanford L, MD  diphenhydrAMINE (BENADRYL) 2 % cream Apply topically 3 (three) times daily as needed for itching. 12/01/21   Johnson, Clanford L, MD  esomeprazole (NEXIUM) 40 MG capsule Take 40 mg by mouth daily before breakfast.      [provider]  furosemide (LASIX) 40 MG tablet Take 40 mg by mouth 2 (two) times daily.    [provider]  gabapentin (NEURONTIN) 300 MG capsule Take 1 capsule (300 mg total) by mouth at bedtime. 05/27/22   Roxan Hockey, MD  hydrALAZINE (APRESOLINE) 25 MG tablet Take 25 mg by mouth in the morning and at bedtime.    [provider]  insulin aspart (NOVOLOG) 100 UNIT/ML FlexPen Inject 0-12 Units into the skin 3 (three) times daily with meals. insulin aspart (novoLOG) injection 0-12 Units 0-12 Units Subcutaneous, 3 times daily with meals CBG < 70: Implement Hypoglycemia Standing Orders and refer to Hypoglycemia Standing Orders sidebar report  CBG 70 - 120: 0 unit CBG 121 - 150: 0 unit  CBG 151 - 200: 2 unit CBG 201 - 250: 4 units CBG 251 -  300: 6 units CBG 301 - 350: 8 units  CBG 351 - 400: 10 units  CBG > 400: 12 units 05/27/22   Emokpae,  [name], MD  insulin detemir (LEVEMIR FLEXPEN) 100 UNIT/ML FlexPen Inject 10 Units into the skin at bedtime. 04/25/22   Brita Romp, NP  levETIRAcetam (KEPPRA) 500 MG tablet Take 500 mg by mouth 2 (two) times daily.    [provider]  metoprolol succinate (TOPROL XL) 100 MG 24 hr tablet Take 1 tablet (100 mg total) by mouth daily. Take with or immediately following a meal. 05/27/22 05/27/23  Emokpae, [name], MD  polyethylene glycol (MIRALAX / GLYCOLAX) 17 g packet Take 17 g by mouth daily. Patient taking differently: Take 17 g by mouth daily as needed for moderate constipation. 12/02/21   Murlean Iba, MD  potassium chloride SA (KLOR-CON M) 20 MEQ tablet Take 1 tablet (20 mEq total) by mouth every Monday, Wednesday, and Friday. Take while taking Lasix/Furosemide 05/28/22   Roxan Hockey, MD  sodium bicarbonate 650 MG tablet Take 2 tablets (1,300 mg total) by mouth 2 (two) times daily. For kidneys 05/27/22 05/27/23  Roxan Hockey, MD    Physical Exam: Vitals:   06/19/22 2330 06/19/22 2353 06/20/22 0000 06/20/22 0113  BP: 120/66  134/68 (!) 148/51  Pulse: 97  91 (!) 101  Resp: 13  (!) 8 12  Temp:    98 F (36.7 C)  TempSrc:    Oral  SpO2: 100% 100% 100% 100%  Weight:    54.2 kg  Height:    '5\' 3"'$  (1.6 m)   1.  General: Patient lying supine in bed,  no acute distress   2. Psychiatric: Alert and oriented x 3, mood and behavior normal for situation, pleasant and cooperative with exam   3. Neurologic: Speech and language are normal, face is symmetric, moves all 4 extremities voluntarily, very subtle weakness in the right lower extremity when compared to the left   4. HEENMT:  Head is atraumatic, normocephalic, pupils reactive to light, neck is supple, trachea is midline, mucous membranes are moist   5. Respiratory : Lungs are clear to auscultation bilaterally without wheezing, rhonchi, rales, no cyanosis, no increase in work of breathing or accessory muscle use    6. Cardiovascular : Heart rate normal, rhythm is regular, no murmurs, rubs or gallops, no peripheral edema, peripheral pulses palpated   7. Gastrointestinal:  Abdomen is soft, nondistended, tenderness at 1 point in the left lower quadrant/pelvis, sounds active, no masses or organomegaly palpated   8. Skin:  Skin is warm, dry and intact without rashes, acute lesions, or ulcers on limited exam   9.Musculoskeletal:  No acute deformities or trauma, no asymmetry in tone, no peripheral edema, peripheral pulses palpated, no tenderness to palpation in the extremities  Data Reviewed: In the ED Temp 98, heart rate 92-103, respiratory rate 11-19, blood pressure 111/63-152/82 Trope 12, 14 Lipase 96 Potassium 7.1 No leukocytosis, hemoglobin stable 8.7 BUN 70, creatinine 3.77, glucose 443 CT abdomen pelvis shows a 2 mm nonobstructing stone.  Chronic pancreatitis. EKG shows a heart rate of 96, sinus rhythm, QTc 433 Patient given calcium gluconate, Rocephin, albuterol, Lasix and 500 mL bolus given, albuterol also administered  Assessment and Plan: Essential hypertension - Continue hydralazine and metoprolol  Mixed diabetic hyperlipidemia associated with type 2 diabetes mellitus (HCC) - Continue statin - 10 units basal insulin at baseline - Hold basal insulin here, sliding scale coverage - Renal diet - Last hemoglobin  A1c was 5.6 - Monitor CBGs  UTI (urinary tract infection) - UA indicative of UTI Urine culture pending Continue Rocephin  Anxiety - Continue Valium and BuSpar  Right leg weakness - Patient reports cute onset of right leg weakness 4 days ago that has made it so she cannot walk -Check a Head CT - Weakness is subtly discernible on exam - No other acute deficits  Hyperkalemia - Potassium initially 6.8, on repeat 7.1 - Patient was given insulin/ D50 combo, Lasix/fluids combo, albuterol treatment, and Lokelma - Nephrology was consulted and just recommended more Lasix,  patient does not need emergent dialysis tonight - Hyperkalemia secondary to AKI - Monitor on telemetry  Acute kidney failure (HCC) - Creatinine increased from 2.3>> 3.77 -BUN 70, creatinine 3.77 >> likely dehydration - 500 mL bolus given in the ED with Lasix - Continue IV hydration throughout the night - Avoid nephrotoxic agents when possible - Nephrology consulted from the ED, and will see this patient - Continue to monitor  Mixed hyperlipidemia - Continue statin      Advance Care Planning:   Code Status: Full Code   Consults: Nephrology  Family Communication: No family at bedside  Severity of Illness: The appropriate patient status for this patient is INPATIENT. Inpatient status is judged to be reasonable and necessary in order to provide the required intensity of service to ensure the patient's safety. The patient's presenting symptoms, physical exam findings, and initial radiographic and laboratory data in the context of their chronic comorbidities is felt to place them at high risk for further clinical deterioration. Furthermore, it is not anticipated that the patient will be medically stable for discharge from the hospital within 2 midnights of admission.   * I certify that at the point of admission it is my clinical judgment that the patient will require inpatient hospital care spanning beyond 2 midnights from the point of admission due to high intensity of service, high risk for further deterioration and high frequency of surveillance required.*  Author: Rolla Plate, DO 06/20/2022 4:00 AM  For on call review [email or web address].

## 2022-06-20 NOTE — Assessment & Plan Note (Signed)
Continue statin. 

## 2022-06-20 NOTE — Consult Note (Signed)
Reason for Consult: AKI/CKD stage IV Referring Physician: Tat, MD  Heather [name] is an 71 y.o. female with a PMH significant for DM type 2, HTN, Crohn's disease, h/o stroke, HLD, chronic pancreatitis, and CKD stage IV (followed by Dr. Theador Hawthorne) who presented to [facility] ED after outpatient labs demonstrated a K of 7.4.  She reports that she has been having abdominal pain, nausea, and vomiting for the past 4 days.  She also complained of generalized weakness and left flank pain.  In the ED, VSS.  Labs notable for Na 128, K 6.8, Co2 20, BUN 70, Cr 3.77, Hgb 8.7, lipase 96, glucose 433.  CT of abdomen/pelvis c/w chronic pancreatitis, 2 mm non-obstructing stone.  She was admitted for acute on chronic pancreatitis and we were consulted to further evaluate and manage her AKI/CKD stage IV.  Of note, she had a similar episode in July .  Her baseline Scr is around 2.5.  She was taking potassium supplements prior to admission.  Trend in Creatinine: Creatinine, Ser  Date/Time Value Ref Range Status  06/20/2022 10:25 AM 2.99 (H) 0.44 - 1.00 mg/dL Final  06/20/2022 03:01 AM 3.57 (H) 0.44 - 1.00 mg/dL Final  06/19/2022 06:59 PM 3.77 (H) 0.44 - 1.00 mg/dL Final  06/19/2022 02:55 PM 3.59 (H) 0.44 - 1.00 mg/dL Final  06/06/2022 01:32 PM 2.35 (H) 0.44 - 1.00 mg/dL Final  05/27/2022 09:15 AM 2.70 (H) 0.44 - 1.00 mg/dL Final  05/27/2022 04:35 AM 2.68 (H) 0.44 - 1.00 mg/dL Final  05/27/2022 02:00 AM 2.86 (H) 0.44 - 1.00 mg/dL Final  05/26/2022 10:16 PM 2.92 (H) 0.44 - 1.00 mg/dL Final  05/26/2022 06:13 PM 3.25 (H) 0.44 - 1.00 mg/dL Final  05/26/2022 01:32 PM 3.47 (H) 0.44 - 1.00 mg/dL Final  05/26/2022 11:00 AM 3.45 (H) 0.44 - 1.00 mg/dL Final  05/17/2022 01:32 PM 2.53 (H) 0.44 - 1.00 mg/dL Final  03/17/2022 04:55 AM 2.93 (H) 0.44 - 1.00 mg/dL Final  03/16/2022 03:34 AM 3.63 (H) 0.44 - 1.00 mg/dL Final  12/01/2021 06:24 AM 2.78 (H) 0.44 - 1.00 mg/dL Final  11/30/2021 05:46 AM 2.73 (H) 0.44 - 1.00 mg/dL Final   11/28/2021 02:37 PM 3.87 (H) 0.44 - 1.00 mg/dL Final  11/25/2021 03:00 PM 2.82 (H) 0.44 - 1.00 mg/dL Final  09/06/2021 01:53 AM 3.60 (H) 0.44 - 1.00 mg/dL Final  09/05/2021 02:57 AM 3.79 (H) 0.44 - 1.00 mg/dL Final  09/04/2021 03:00 AM 3.85 (H) 0.44 - 1.00 mg/dL Final  09/03/2021 03:04 AM 4.12 (H) 0.44 - 1.00 mg/dL Final  09/02/2021 02:54 AM 4.51 (H) 0.44 - 1.00 mg/dL Final  09/01/2021 03:25 AM 4.61 (H) 0.44 - 1.00 mg/dL Final  08/31/2021 05:00 AM 4.93 (H) 0.44 - 1.00 mg/dL Final  08/30/2021 05:00 AM 4.60 (H) 0.44 - 1.00 mg/dL Final  08/29/2021 03:50 AM 4.32 (H) 0.44 - 1.00 mg/dL Final  08/28/2021 06:33 AM 3.88 (H) 0.44 - 1.00 mg/dL Final  08/27/2021 04:00 AM 3.37 (H) 0.44 - 1.00 mg/dL Final  08/26/2021 01:03 AM 3.54 (H) 0.44 - 1.00 mg/dL Final  08/25/2021 04:07 AM 3.65 (H) 0.44 - 1.00 mg/dL Final  08/24/2021 04:30 AM 3.12 (H) 0.44 - 1.00 mg/dL Final  08/23/2021 04:43 AM 2.81 (H) 0.44 - 1.00 mg/dL Final  08/22/2021 05:04 AM 3.71 (H) 0.44 - 1.00 mg/dL Final  08/21/2021 04:45 AM 3.29 (H) 0.44 - 1.00 mg/dL Final  08/20/2021 07:00 PM 2.88 (H) 0.44 - 1.00 mg/dL Final  08/20/2021 04:00 AM 4.63 (H) 0.44 - 1.00 mg/dL  Final  08/19/2021 05:11 AM 3.88 (H) 0.44 - 1.00 mg/dL Final  08/19/2021 05:04 AM 3.93 (H) 0.44 - 1.00 mg/dL Final  08/18/2021 06:22 AM 3.86 (H) 0.44 - 1.00 mg/dL Final  08/17/2021 05:42 AM 3.21 (H) 0.44 - 1.00 mg/dL Final  08/16/2021 12:06 PM 3.23 (H) 0.44 - 1.00 mg/dL Final  08/02/2021 10:33 AM 2.13 (H) 0.44 - 1.00 mg/dL Final  06/20/2021 09:54 AM 1.63 (H) 0.44 - 1.00 mg/dL Final  05/18/2021 11:53 AM 1.76 (H) 0.44 - 1.00 mg/dL Final  04/20/2021 01:29 PM 2.11 (H) 0.44 - 1.00 mg/dL Final  03/27/2021 01:49 PM 2.29 (H) 0.44 - 1.00 mg/dL Final  03/02/2021 02:01 PM 1.70 (H) 0.44 - 1.00 mg/dL Final  02/02/2021 03:29 PM 1.72 (H) 0.44 - 1.00 mg/dL Final  01/11/2021 05:23 AM 1.62 (H) 0.44 - 1.00 mg/dL Final  01/10/2021 06:32 AM 1.75 (H) 0.44 - 1.00 mg/dL Final    PMH:   Past  Medical History:  Diagnosis Date   Acute renal failure superimposed on stage 3 chronic kidney disease ([place]) 01/21/2016   Anemia    Anxiety    B12 deficiency    Brain tumor (benign) ([place]) 10/29/2020   Cellulitis and abscess    Abdomen and buttocks   Chronic diastolic CHF (congestive heart failure) ([place]) 03/16/2022   CKD (chronic kidney disease) stage 3, GFR 30-59 ml/min (HCC)    Closed fracture of distal end of femur, unspecified fracture morphology, initial encounter ([place])    Coronary arteriosclerosis 07/15/2018   Crohn disease ([place])    Depression    Diabetic neuropathy (HCC)    feet   DJD (degenerative joint disease)    Right forminal stenosis C4-5   Essential hypertension    Femur fracture, right ([place]) 50/93/2671   Folliculitis    GERD (gastroesophageal reflux disease)    Gout    Headache(784.0)    Hiatal hernia    History of blood transfusion    History of bronchitis    History of cardiac catheterization    No significant CAD 2012   History of kidney stones    surgery to remove   Insomnia    Irritable bowel syndrome    Mixed hyperlipidemia    Osteoarthritis    Palpitations    Pneumonia    Reflux esophagitis    Salmonella    Stroke [facility])    Tinnitus    Right   Type 2 diabetes mellitus (HCC)     PSH:   Past Surgical History:  Procedure Laterality Date   BACK SURGERY     c-spine surgery     03/2007   CARDIAC CATHETERIZATION     CHOLECYSTECTOMY     COLONOSCOPY  04/21/2011; 05/29/11   6/12: [place] - ?AVM at IC valve, inflammatory changes at IC valve [facility]); 7/12 - [place]; IC valve erosions, look chronic and probably Crohn's per path   colonoscopy  2017   [place]: random biopsies normal. TI normal   ESOPHAGOGASTRODUODENOSCOPY  05/29/11   normal   ESOPHAGOGASTRODUODENOSCOPY N/A 01/21/2016   Dr. Michail Sermon: normal   EYE SURGERY Bilateral    cataracts removed   FEMUR IM NAIL Right 04/10/2018   Procedure: INTRAMEDULLARY (IM) NAIL FEMORAL;   Surgeon: Paralee Cancel, MD;  Location: [facility];  Service: Orthopedics;  Laterality: Right;   FRACTURE SURGERY     GIVENS CAPSULE STUDY  11/17/2020   HARDWARE REMOVAL Right 04/10/2018   Procedure: HARDWARE REMOVAL RIGHT DISTAL FEMUR;  Surgeon: Paralee Cancel, MD;  Location: [facility];  Service: Orthopedics;  Laterality: Right;   HERNIA REPAIR     IR FLUORO GUIDE CV LINE RIGHT  04/01/2017   IR US GUIDE VASC ACCESS RIGHT  04/01/2017   JOINT REPLACEMENT Right    hip   OPEN REDUCTION INTERNAL FIXATION (ORIF) DISTAL RADIAL FRACTURE Right 05/28/2019   Procedure: OPEN REDUCTION INTERNAL FIXATION (ORIF) DISTAL RADIAL FRACTURE;  Surgeon: Verner Mould, MD;  Location: [place];  Service: Orthopedics;  Laterality: Right;  with block 90 minutes   ORIF FEMUR FRACTURE Right 03/31/2017   Procedure: OPEN REDUCTION INTERNAL FIXATION (ORIF) DISTAL FEMUR FRACTURE;  Surgeon: Paralee Cancel, MD;  Location: [place];  Service: Orthopedics;  Laterality: Right;   removal of kidney stone     Right knee arthroscopic surgery     TONSILLECTOMY     TOTAL ABDOMINAL HYSTERECTOMY      Allergies:  Allergies  Allergen Reactions   Cozaar [Losartan] Nausea And Vomiting, Swelling and Rash   Quinolones Itching, Nausea And Vomiting, Nausea Only and Swelling    Swelling of face, jaw, and lips    Cymbalta [Duloxetine Hcl] Itching and Swelling   Sulfa Antibiotics Nausea And Vomiting and Swelling    Headache  Swelling of feet, legs    Synalar [Fluocinolone] Swelling   Cipro [Ciprofloxacin Hcl] Nausea And Vomiting and Swelling    Swelling of face, jaw, lips   Diovan [Valsartan] Nausea And Vomiting and Swelling   Glucophage [Metformin] Other (See Comments)    GI upset   Ketalar [Ketamine] Swelling   Norco [Hydrocodone-Acetaminophen] Itching and Swelling   Norvasc [Amlodipine Besylate] Itching and Swelling    Fluid retention    Nsaids Nausea And Vomiting and Other (See Comments)    Has bleeding ulcers   Tylenol [Acetaminophen]  Itching and Swelling   Tape Itching and Rash    Paper tape can only be used on this patient   Zestril [Lisinopril] Swelling and Rash    Oral swelling Red streaks on arms/legs    Medications:   Prior to Admission medications   Medication Sig Start Date End Date Taking? Authorizing Provider  acetaminophen (TYLENOL) 325 MG tablet Take 2 tablets (650 mg total) by mouth every 4 (four) hours. 12/01/21  Yes Johnson, Clanford L, MD  allopurinol (ZYLOPRIM) 100 MG tablet Take 100 mg by mouth daily. 03/20/21  Yes [provider]  Ascorbic Acid (VITAMIN C WITH ROSE HIPS) 500 MG tablet Take 500 mg by mouth every morning.   Yes [provider]  atorvastatin (LIPITOR) 80 MG tablet Take 80 mg by mouth daily. 01/03/22  Yes [provider]  busPIRone (BUSPAR) 10 MG tablet Take 10 mg by mouth 3 (three) times daily as needed for anxiety. 03/04/22  Yes [provider]  calcitRIOL (ROCALTROL) 0.25 MCG capsule Take 0.25 mcg by mouth 3 (three) times a week. Monday,Wed and Fri. 10/05/21  Yes [provider]  calcium carbonate (OS-CAL) 1250 (500 Ca) MG chewable tablet Chew 1 tablet by mouth 2 (two) times daily. 10/04/21 10/04/22 Yes [provider]  Cholecalciferol (VITAMIN D) 2000 units CAPS Take 2,000 Units by mouth every morning.    Yes [provider]  cyanocobalamin (,VITAMIN B-12,) 1000 MCG/ML injection Inject 1,000 mcg into the muscle every 30 (thirty) days.     Yes [provider]  diazepam (VALIUM) 5 MG tablet Take 1 tablet (5 mg total) by mouth every 12 (twelve) hours as needed for anxiety. 12/01/21  Yes Johnson, Clanford L, MD  dicyclomine (BENTYL)  20 MG tablet Take 1 tablet (20 mg total) by mouth in the morning and at bedtime. 12/01/21  Yes Johnson, Clanford L, MD  diphenhydrAMINE (BENADRYL) 2 % cream Apply topically 3 (three) times daily as needed for itching. 12/01/21  Yes Johnson, Clanford L, MD  esomeprazole (NEXIUM) 40 MG capsule Take 40 mg by  mouth daily before breakfast.     Yes [provider]  furosemide (LASIX) 40 MG tablet Take 40 mg by mouth 2 (two) times daily.   Yes [provider]  gabapentin (NEURONTIN) 300 MG capsule Take 1 capsule (300 mg total) by mouth at bedtime. 05/27/22  Yes Emokpae, [name], MD  hydrALAZINE (APRESOLINE) 25 MG tablet Take 25 mg by mouth in the morning and at bedtime.   Yes [provider]  insulin detemir (LEVEMIR FLEXPEN) 100 UNIT/ML FlexPen Inject 10 Units into the skin at bedtime. 04/25/22  Yes Brita Romp, NP  levETIRAcetam (KEPPRA) 500 MG tablet Take 500 mg by mouth 2 (two) times daily.   Yes [provider]  metoprolol succinate (TOPROL XL) 100 MG 24 hr tablet Take 1 tablet (100 mg total) by mouth daily. Take with or immediately following a meal. 05/27/22 05/27/23 Yes Emokpae, [name], MD  polyethylene glycol (MIRALAX / GLYCOLAX) 17 g packet Take 17 g by mouth daily. Patient taking differently: Take 17 g by mouth daily as needed for moderate constipation. 12/02/21  Yes Johnson, Clanford L, MD  potassium chloride SA (KLOR-CON M) 20 MEQ tablet Take 1 tablet (20 mEq total) by mouth every Monday, Wednesday, and Friday. Take while taking Lasix/Furosemide 05/28/22  Yes Emokpae, [name], MD  sodium bicarbonate 650 MG tablet Take 2 tablets (1,300 mg total) by mouth 2 (two) times daily. For kidneys 05/27/22 05/27/23 Yes Emokpae, [name], MD  insulin aspart (NOVOLOG) 100 UNIT/ML FlexPen Inject 0-12 Units into the skin 3 (three) times daily with meals. insulin aspart (novoLOG) injection 0-12 Units 0-12 Units Subcutaneous, 3 times daily with meals CBG < 70: Implement Hypoglycemia Standing Orders and refer to Hypoglycemia Standing Orders sidebar report  CBG 70 - 120: 0 unit CBG 121 - 150: 0 unit  CBG 151 - 200: 2 unit CBG 201 - 250: 4 units CBG 251 - 300: 6 units CBG 301 - 350: 8 units  CBG 351 - 400: 10 units  CBG > 400: 12 units Patient not taking: Reported on 06/20/2022 05/27/22    Roxan Hockey, MD    Inpatient medications:  atorvastatin  80 mg Oral Daily   calcitRIOL  0.25 mcg Oral Once per day on Mon Wed Fri   Chlorhexidine Gluconate Cloth  6 each Topical Q0600   gabapentin  300 mg Oral QHS   heparin  5,000 Units Subcutaneous Q8H   hydrALAZINE  25 mg Oral BID   insulin aspart  0-15 Units Subcutaneous TID WC   insulin aspart  0-5 Units Subcutaneous QHS   levETIRAcetam  500 mg Oral BID   metoprolol succinate  100 mg Oral Daily   sodium bicarbonate  1,300 mg Oral BID   sodium zirconium cyclosilicate  10 g Oral TID    Discontinued Meds:   Medications Discontinued During This Encounter  Medication Reason   insulin aspart (novoLOG) injection 5 Units    dextrose 50 % solution 50 mL     Social History:  reports that she has never smoked. She has never used smokeless tobacco. She reports that she does not drink alcohol and does not use drugs.  Family History:  Family History  Problem Relation Age of Onset   Diabetes Mother    Colon cancer Brother        POSSIBLY. Patient states diagnosed at age 19, then later states age 17. unclear and limited historian   Esophageal cancer Neg Hx    Rectal cancer Neg Hx    Stomach cancer Neg Hx     Pertinent items are noted in HPI. Weight change:   Intake/Output Summary (Last 24 hours) at 06/20/2022 1224 Last data filed at 06/20/2022 1213 Gross per 24 hour  Intake 870 ml  Output 880 ml  Net -10 ml   BP (!) 126/47   Pulse (!) 105   Temp (!) 97.5 F (36.4 C) (Oral)   Resp (!) 21   Ht '5\' 3"'$  (1.6 m)   Wt 54.2 kg   LMP  (LMP Unknown)   SpO2 100%   BMI 21.17 kg/m  Vitals:   06/20/22 0857 06/20/22 0900 06/20/22 1113 06/20/22 1117  BP: (!) 126/53 (!) 107/29  (!) 126/47  Pulse: (!) 106 (!) 105    Resp:  (!) 21    Temp:   (!) 97.5 F (36.4 C)   TempSrc:   Oral   SpO2:  100%    Weight:      Height:         General appearance: alert, cooperative, mild distress, and pale Head: Normocephalic, without  obvious abnormality, atraumatic Resp: clear to auscultation bilaterally Cardio: regular rate and rhythm, S1, S2 normal, no murmur, click, rub or gallop GI: hypoactive bowel sounds, mildly tender to palpation, no guarding or rebound Extremities: extremities normal, atraumatic, no cyanosis or edema  Labs: Basic Metabolic Panel: Recent Labs  Lab 06/19/22 1455 06/19/22 1859 06/19/22 2143 06/20/22 0301 06/20/22 1025  NA 128* 128*  --  134* 137  K >7.5* 6.8* 7.1* 5.7* 4.9  CL 97* 98  --  103 113*  CO2 20* 20*  --  21* 19*  GLUCOSE 439* 493*  --  110* 101*  BUN 68* 70*  --  69* 56*  CREATININE 3.59* 3.77*  --  3.57* 2.99*  ALBUMIN 3.2*  --   --  2.8* 2.1*  CALCIUM 9.3 9.1  --  9.0 7.0*  PHOS 5.1*  --   --   --  4.5   Liver Function Tests: Recent Labs  Lab 06/19/22 1455 06/20/22 0301 06/20/22 1025  AST  --  40  --   ALT  --  32  --   ALKPHOS  --  130*  --   BILITOT  --  0.5  --   PROT  --  6.7  --   ALBUMIN 3.2* 2.8* 2.1*   Recent Labs  Lab 06/19/22 2143  LIPASE 96*   No results for input(s): "AMMONIA" in the last 168 hours. CBC: Recent Labs  Lab 06/19/22 1432 06/19/22 1454 06/19/22 1859 06/20/22 0301  WBC  --  10.4 6.9 9.7  NEUTROABS  --  8.6*  --  6.7  HGB 10.1* 9.7* 8.7* 9.5*  HCT  --  30.2* 26.4* 30.0*  MCV  --  97.7 97.4 98.7  PLT  --  296 230 256   PT/INR: '@LABRCNTIP'$ (inr:5) Cardiac Enzymes: ) Recent Labs  Lab 06/20/22 1025  CKTOTAL 38   CBG: Recent Labs  Lab 06/19/22 1932 06/20/22 0051 06/20/22 0218 06/20/22 0726 06/20/22 1105  GLUCAP 386* 117* 121* 96 123*    Iron Studies: No results for input(s): "IRON", "TIBC", "TRANSFERRIN", "FERRITIN" in  the last 168 hours.  Xrays/Other Studies: CT HEAD WO CONTRAST (5MM)  Result Date: 06/20/2022 CLINICAL DATA:  71 year old female with altered mental status, right leg weakness. EXAM: CT HEAD WITHOUT CONTRAST TECHNIQUE: Contiguous axial images were obtained from the base of the skull through the  vertex without intravenous contrast. RADIATION DOSE REDUCTION: This exam was performed according to the departmental dose-optimization program which includes automated exposure control, adjustment of the mA and/or kV according to patient size and/or use of iterative reconstruction technique. COMPARISON:  Head CT 03/14/2022, brain MRI 03/09/2022. FINDINGS: Brain: Stable cerebral volume. No midline shift, mass effect, or evidence of intracranial mass lesion. No ventriculomegaly. No acute intracranial hemorrhage identified. Chronic lacunar infarct at the left genu internal capsule is stable along with mild additional bilateral deep white matter heterogeneity. Preserved gray-white matter differentiation otherwise. No definite cortical encephalomalacia. Vascular: Extensive Calcified atherosclerosis at the skull base. No suspicious intracranial vascular hyperdensity. Skull: No acute osseous abnormality identified. Sinuses/Orbits: Visualized paranasal sinuses and mastoids are stable and well aerated. Other: No acute orbit or scalp soft tissue finding. IMPRESSION: No acute intracranial abnormality. Stable non contrast CT appearance of chronic small vessel disease. Electronically Signed   By: Genevie [name] M.D.   On: 06/20/2022 06:06   DG Chest 2 View  Result Date: 06/19/2022 CLINICAL DATA:  Chest pain EXAM: CHEST - 2 VIEW COMPARISON:  Radiographs 03/08/2022 FINDINGS: No focal consolidation, pleural effusion, or pneumothorax. Normal cardiomediastinal silhouette. No acute osseous abnormality. Chronic displaced fracture of the proximal right humerus. Aortic calcification. Cervical spine fusion hardware. IMPRESSION: No active cardiopulmonary disease. Electronically Signed   By: Placido Sou M.D.   On: 06/19/2022 20:25   CT ABDOMEN PELVIS WO CONTRAST  Result Date: 06/19/2022 CLINICAL DATA:  Abdominal pain. EXAM: CT ABDOMEN AND PELVIS WITHOUT CONTRAST TECHNIQUE: Multidetector CT imaging of the abdomen and pelvis was  performed following the standard protocol without IV contrast. RADIATION DOSE REDUCTION: This exam was performed according to the departmental dose-optimization program which includes automated exposure control, adjustment of the mA and/or kV according to patient size and/or use of iterative reconstruction technique. COMPARISON:  CT abdomen pelvis dated 05/26/2022. FINDINGS: Evaluation of this exam is limited in the absence of intravenous contrast. Lower chest: The visualized lung bases are clear. There is coronary vascular calcification and calcification of the mitral annulus. No intra-abdominal free air or free fluid. Hepatobiliary: The liver is unremarkable. No biliary dilatation. Cholecystectomy. No retained calcified stone noted in the central CBD. Pancreas: There is infiltration of the fat surrounding the distal body and tail of the pancreas with loss of fat plane between the pancreas and stomach similar to prior CT. Findings most consistent with scarring and adhesions sequela of chronic pancreatitis. Correlation with pancreatic enzymes recommended to evaluate for possibility of acute on chronic pancreatitis. No drainable fluid collection, abscess, or pseudocyst. Spleen: Normal in size without focal abnormality. Adrenals/Urinary Tract: The adrenal glands unremarkable. Mild bilateral renal parenchyma atrophy. There is a 2 mm nonobstructing right renal mid pole calculus. No hydronephrosis on either side. Subcentimeter exophytic left renal interpolar hypodense lesion is too small to characterize but appears similar to prior CT, likely a cyst. The visualized ureters and urinary bladder appear unremarkable. Stomach/Bowel: There is moderate stool throughout the colon. There is no bowel obstruction or active inflammation. The appendix is not visualized with certainty. No inflammatory changes identified in the right lower quadrant. Vascular/Lymphatic: Advanced aortoiliac atherosclerotic disease. The IVC is  unremarkable. No portal venous gas. There is no  adenopathy. Reproductive: Hysterectomy.  No adnexal masses. Other: None Musculoskeletal: There is osteopenia with degenerative changes spine and multilevel posterior fusion hardware. Similar appearance of T12 compression fracture as the prior CT. No new fracture. IMPRESSION: 1. No bowel obstruction. 2. A 2 mm nonobstructing right renal mid pole calculus. No hydronephrosis. 3. Infiltration of the fat surrounding the distal body and tail of the pancreas with loss of fat plane between the pancreas and stomach similar to prior CT. Findings most consistent with scarring and adhesions sequela of chronic pancreatitis. Correlation with pancreatic enzymes recommended to evaluate for possibility of acute on chronic pancreatitis. No drainable fluid collection, abscess, or pseudocyst. 4. T12 compression fracture similar to prior CT. 5. Aortic Atherosclerosis (ICD10-I70.0). Electronically Signed   By: Anner Crete M.D.   On: 06/19/2022 20:23     Assessment/Plan:  AKI/CKD stage IV - presumably ATN in setting of volume depletion.  No history of nephrotoxic agents.  Has had multiple episodes in the past and as recently as July.  Agree with continuing with IVF's as BUN/Cr are improving.  Continue to hold KCl for now.  Avoid nephrotoxic medications including NSAIDs and iodinated intravenous contrast exposure unless the latter is absolutely indicated. Preferred narcotic agents for pain control are hydromorphone, fentanyl, and methadone. Morphine should not be used.  Avoid Baclofen and avoid oral sodium phosphate and magnesium citrate based laxatives / bowel preps.  Continue strict Input and Output monitoring. Will monitor the patient closely with you and intervene or adjust therapy as indicated by changes in clinical status/labs   Hyperkalemia - due to AKI and KCl supplements. Now resolved.  Acute on chronic pancreatitis - still with abdominal pain.  Plan per primary. DM  type 2 - uncontrolled. Per primary Crohn's diseae - per primary.   Heather [name] 06/20/2022, 12:24 PM

## 2022-06-21 ENCOUNTER — Other Ambulatory Visit: Payer: Self-pay

## 2022-06-21 ENCOUNTER — Inpatient Hospital Stay (HOSPITAL_COMMUNITY): Payer: Medicare HMO

## 2022-06-21 DIAGNOSIS — E782 Mixed hyperlipidemia: Secondary | ICD-10-CM | POA: Diagnosis not present

## 2022-06-21 DIAGNOSIS — I10 Essential (primary) hypertension: Secondary | ICD-10-CM | POA: Diagnosis not present

## 2022-06-21 DIAGNOSIS — N390 Urinary tract infection, site not specified: Secondary | ICD-10-CM

## 2022-06-21 DIAGNOSIS — Z79899 Other long term (current) drug therapy: Secondary | ICD-10-CM | POA: Diagnosis not present

## 2022-06-21 DIAGNOSIS — N184 Chronic kidney disease, stage 4 (severe): Secondary | ICD-10-CM

## 2022-06-21 DIAGNOSIS — N179 Acute kidney failure, unspecified: Secondary | ICD-10-CM | POA: Diagnosis not present

## 2022-06-21 DIAGNOSIS — E875 Hyperkalemia: Secondary | ICD-10-CM | POA: Diagnosis not present

## 2022-06-21 LAB — COMPREHENSIVE METABOLIC PANEL
ALT: 128 U/L — ABNORMAL HIGH (ref 0–44)
AST: 145 U/L — ABNORMAL HIGH (ref 15–41)
Albumin: 2.4 g/dL — ABNORMAL LOW (ref 3.5–5.0)
Alkaline Phosphatase: 182 U/L — ABNORMAL HIGH (ref 38–126)
Anion gap: 9 (ref 5–15)
BUN: 55 mg/dL — ABNORMAL HIGH (ref 8–23)
CO2: 20 mmol/L — ABNORMAL LOW (ref 22–32)
Calcium: 8.4 mg/dL — ABNORMAL LOW (ref 8.9–10.3)
Chloride: 107 mmol/L (ref 98–111)
Creatinine, Ser: 2.88 mg/dL — ABNORMAL HIGH (ref 0.44–1.00)
GFR, Estimated: 17 mL/min — ABNORMAL LOW (ref >60)
Glucose, Bld: 216 mg/dL — ABNORMAL HIGH (ref 70–99)
Potassium: 5.1 mmol/L (ref 3.5–5.1)
Sodium: 136 mmol/L (ref 135–145)
Total Bilirubin: 0.3 mg/dL (ref 0.3–1.2)
Total Protein: 5.8 g/dL — ABNORMAL LOW (ref 6.5–8.1)

## 2022-06-21 LAB — GLUCOSE, CAPILLARY
Glucose-Capillary: 181 mg/dL — ABNORMAL HIGH (ref 70–99)
Glucose-Capillary: 157 mg/dL — ABNORMAL HIGH (ref 70–99)
Glucose-Capillary: 229 mg/dL — ABNORMAL HIGH (ref 70–99)
Glucose-Capillary: 106 mg/dL — ABNORMAL HIGH (ref 70–99)

## 2022-06-21 LAB — LIPID PANEL
Cholesterol: 115 mg/dL (ref 0–200)
HDL: 44 mg/dL (ref >40)
LDL Cholesterol: 48 mg/dL (ref 0–99)
Total CHOL/HDL Ratio: 2.6 RATIO
Triglycerides: 116 mg/dL (ref <150)
VLDL: 23 mg/dL (ref 0–40)

## 2022-06-21 LAB — CBC
HCT: 26.3 % — ABNORMAL LOW (ref 36.0–46.0)
Hemoglobin: 8.6 g/dL — ABNORMAL LOW (ref 12.0–15.0)
MCH: 31.9 pg (ref 26.0–34.0)
MCHC: 32.7 g/dL (ref 30.0–36.0)
MCV: 97.4 fL (ref 80.0–100.0)
Platelets: 218 10*3/uL (ref 150–400)
RBC: 2.7 MIL/uL — ABNORMAL LOW (ref 3.87–5.11)
RDW: 13.9 % (ref 11.5–15.5)
WBC: 5.9 10*3/uL (ref 4.0–10.5)
nRBC: 0 % (ref 0.0–0.2)

## 2022-06-21 LAB — MAGNESIUM: Magnesium: 1.9 mg/dL (ref 1.7–2.4)

## 2022-06-21 MED ORDER — INSULIN ASPART 100 UNIT/ML IJ SOLN: 0.0000 [IU] | Freq: Every day | INTRAMUSCULAR | Status: DC

## 2022-06-21 MED ORDER — INSULIN ASPART 100 UNIT/ML IJ SOLN
0.0000 [IU] | Freq: Three times a day (TID) | INTRAMUSCULAR | Status: DC
  Administered 2022-06-21: 1 [IU] via SUBCUTANEOUS
  Administered 2022-06-22 (×2): 2 [IU] via SUBCUTANEOUS

## 2022-06-21 MED ORDER — HYDRALAZINE HCL 25 MG PO TABS
50.0000 mg | ORAL_TABLET | Freq: Two times a day (BID) | ORAL | Status: DC
  Administered 2022-06-21 – 2022-06-22 (×2): 50 mg via ORAL
  Filled 2022-06-21 (×2): qty 2

## 2022-06-21 MED ORDER — INSULIN DETEMIR 100 UNIT/ML ~~LOC~~ SOLN
5.0000 [IU] | Freq: Every day | SUBCUTANEOUS | Status: DC
  Administered 2022-06-21 – 2022-06-22 (×2): 5 [IU] via SUBCUTANEOUS
  Filled 2022-06-21 (×3): qty 0.05

## 2022-06-21 NOTE — Inpatient Diabetes Management (Signed)
Inpatient Diabetes Program Recommendations  AACE/ADA: New Consensus Statement on Inpatient Glycemic Control (2015)  Target Ranges:  Prepandial:   less than 140 mg/dL      Peak postprandial:   less than 180 mg/dL (1-2 hours)      Critically ill patients:  140 - 180 mg/dL   Lab Results  Component Value Date   GLUCAP 181 (H) 06/21/2022   HGBA1C 10.0 (H) 06/20/2022    Review of Glycemic Control  Latest Reference Range & Units 06/20/22 07:26 06/20/22 11:05 06/20/22 16:20 06/20/22 16:43 06/20/22 20:53 06/21/22 08:05  Glucose-Capillary 70 - 99 mg/dL 96 123 (H) Novolog 2 units 67 (L) 83 132 (H) 181 (H) Novolog 3 units   Diabetes history: DM Outpatient Diabetes medications: Levemir 10, Nov 0-12 tid Current orders for Inpatient glycemic control: Novolog 0-15 units tid, 0-5 units hs  Inpatient Diabetes Program Recommendations:   Please consider: -Decrease Novolog correction to 0-6 units tid, 0-5 units hs -Add Levemir 5 units qd if CBGs >180  Thank you, Bethena Roys E. Cobe Viney, RN, MSN, CDE  Diabetes Coordinator Inpatient Glycemic Control Team Team Pager [phone] (8am-5pm) 06/21/2022 10:56 AM

## 2022-06-21 NOTE — Progress Notes (Signed)
PROGRESS NOTE  Heather [name] ZOX:[id number] DOB: September 20, 1951 DOA: 06/19/2022 PCP: [name], MD  Brief History:  71 y/o female with a history of diabetes mellitus type 2, hyperlipidemia, Crohn's disease, CKD stage 4, B12 deficiency, anxiety disorder, stroke, HTN, chronic pancreatitis, mixed hyperlipidemia presenting after she was notified by her PCP to go to the ED for abnormal labs. The patient is a difficult historian, but she is able to give generalities.  The details of her history are somewhat conflicting.  Nevertheless, it appears that the patient has been having nausea with worsening abdominal pain for the past 3 days. She denies any f/c, vomiting, diarrhea, dysuria.  She complains of right leg weakness which seems to have started in late Jan 2023 after a fall.  She also has frequent falls at home.  She also complains of low back pain which seems to be somewhat exacerbated by her recent falls.  She denies syncope.  She denies any hematochezia or melena.   In the ED, the patient was afebrile and hemodynamically stable with oxygen saturation 100% room air.  Initial BMP showed a potassium > 7.5 with serum creatinine 3.77.  LFTs were unremarkable.  WBC 10.4, hemoglobin 9.7, platelets 296,000.     Assessment/Plan: Acute on chronic pancreatitis -Lipase 96 -06/19/2022 CT abdomen--fatty infiltration of the body and tail of the pancreas, old T12 compression fracture -continue clear liquid diet--pt not ready for diet advancement yet>>still nauseous with abd pain -Judicious opioids -triglycerides 116, LDL 48   Acute on chronic renal failure--CKD 4 -Baseline creatinine 2.3-2.7 -Secondary to volume depletion -Continue IV fluids>>improving -no hydronephrosis on CT abd   Uncontrolled diabetes mellitus type 2 with hyperglycemia -Presented with CBGs in the 400s -06/20/22 hemoglobin A1c 10.0 -NovoLog sliding scale--decrease to very sensitive scale -start levemir 5 units  E Coli  UTI -continue ceftriaxone   Hyperkalemia -Continue Lokelma>>improving -Continue IV fluids -Remain on telemetry -stopped K supplemtes   Mixed hyperlipidemia -hold statin due to elevated LFTs   Right leg weakness -Check CPK--38 -CT brain neg -MRI lumbar spine--Subacute to early chronic superior endplate fracture at W11 with 40% loss of height anteriorly, multilevel DJD without cord impingement.   Visual disturbance -MRI brain--neg for acute findings;6 mm meningioma overlying the anterior right frontal lobe   Essential hypertension -Continue metoprolol succinate and hydralazine -increase hydralazine dose   Crohn's Disease -no active symptoms presently -oupt GI followup    Hypomagnesesmia -replete judiciously  Transaminasemia -RUQ [place] -trend -hep B surface antigen -hep C antibody -d/c lipitor       Family Communication:   son updated at bedside 8/24   Consultants:  none   Code Status:  FULL    DVT Prophylaxis:  [place] Heparin      Procedures: As Listed in Progress Note Above   Antibiotics: Ceftriaxone 8/22>>       Subjective: Patient continues to have epigastric abd pain--moderate.  Has nausea.  Denies f/c, cp sob.    Objective: Vitals:   06/20/22 1800 06/20/22 2049 06/21/22 0827 06/21/22 1307  BP: (!) 132/58 (!) 158/80 (!) 163/66 (!) 176/79  Pulse: 95 100  95  Resp: 20   17  Temp: 98.1 F (36.7 C) 98 F (36.7 C)  98.1 F (36.7 C)  TempSrc: Oral Oral    SpO2: 99% 100%  100%  Weight:      Height:        Intake/Output Summary (Last 24 hours) at 06/21/2022 1430  Last data filed at 06/21/2022 0502 Gross per 24 hour  Intake 3590.7 ml  Output --  Net 3590.7 ml   Weight change: 1.629 kg Exam:  General:  Pt is alert, follows commands appropriately, not in acute distress HEENT: No icterus, No thrush, No neck mass, [place]/AT Cardiovascular: RRR, S1/S2, no rubs, no gallops Respiratory: CTA bilaterally, no wheezing, no crackles, no rhonchi Abdomen:  Soft/+BS, epigastric tender, non distended, no guarding Extremities: No edema, No lymphangitis, No petechiae, No rashes, no synovitis   Data Reviewed: I have personally reviewed following labs and imaging studies Basic Metabolic Panel: Recent Labs  Lab 06/19/22 1455 06/19/22 1859 06/19/22 2143 06/20/22 0301 06/20/22 1025 06/21/22 0607  NA 128* 128*  --  134* 137 136  K >7.5* 6.8* 7.1* 5.7* 4.9 5.1  CL 97* 98  --  103 113* 107  CO2 20* 20*  --  21* 19* 20*  GLUCOSE 439* 493*  --  110* 101* 216*  BUN 68* 70*  --  69* 56* 55*  CREATININE 3.59* 3.77*  --  3.57* 2.99* 2.88*  CALCIUM 9.3 9.1  --  9.0 7.0* 8.4*  MG  --   --   --  1.4*  --  1.9  PHOS 5.1*  --   --   --  4.5  --    Liver Function Tests: Recent Labs  Lab 06/19/22 1455 06/20/22 0301 06/20/22 1025 06/21/22 0607  AST  --  40  --  145*  ALT  --  32  --  128*  ALKPHOS  --  130*  --  182*  BILITOT  --  0.5  --  0.3  PROT  --  6.7  --  5.8*  ALBUMIN 3.2* 2.8* 2.1* 2.4*   Recent Labs  Lab 06/19/22 2143  LIPASE 96*   No results for input(s): "AMMONIA" in the last 168 hours. Coagulation Profile: No results for input(s): "INR", "PROTIME" in the last 168 hours. CBC: Recent Labs  Lab 06/19/22 1432 06/19/22 1454 06/19/22 1859 06/20/22 0301 06/21/22 0607  WBC  --  10.4 6.9 9.7 5.9  NEUTROABS  --  8.6*  --  6.7  --   HGB 10.1* 9.7* 8.7* 9.5* 8.6*  HCT  --  30.2* 26.4* 30.0* 26.3*  MCV  --  97.7 97.4 98.7 97.4  PLT  --  296 230 256 218   Cardiac Enzymes: Recent Labs  Lab 06/20/22 1025  CKTOTAL 38   BNP: Invalid input(s): "POCBNP" CBG: Recent Labs  Lab 06/20/22 1620 06/20/22 1643 06/20/22 2053 06/21/22 0805 06/21/22 1139  GLUCAP 67* 83 132* 181* 157*   HbA1C: Recent Labs    06/20/22 1025  HGBA1C 10.0*   Urine analysis:    Component Value Date/Time   COLORURINE YELLOW 06/19/2022 2030   APPEARANCEUR HAZY (A) 06/19/2022 2030   LABSPEC 1.010 06/19/2022 2030   PHURINE 5.0 06/19/2022 2030    GLUCOSEU >=500 (A) 06/19/2022 2030   GLUCOSEU NEGATIVE 03/28/2018 1224   [place] NEGATIVE 06/19/2022 2030   [place] NEGATIVE 06/19/2022 2030   [place] 06/19/2022 2030   PROTEINUR 100 (A) 06/19/2022 2030   UROBILINOGEN 1.0 03/28/2018 1224   NITRITE NEGATIVE 06/19/2022 2030   LEUKOCYTESUR SMALL (A) 06/19/2022 2030   Sepsis Labs: '@LABRCNTIP'$ (procalcitonin:4,lacticidven:4) ) Recent Results (from the past 240 hour(s))  Urine Culture     Status: Abnormal (Preliminary result)   Collection Time: 06/19/22  9:03 PM   Specimen: Urine, Clean Catch  Result Value Ref Range Status  Specimen Description   Final    URINE, CLEAN CATCH Performed at [facility], [address] [place], [place] [address]    Special Requests   Final    NONE Performed at [facility], [address], [place], [place] [address]    Culture >=100,000 COLONIES/mL ESCHERICHIA COLI (A)  Final   Report Status PENDING  Incomplete  MRSA Next Gen by PCR, Nasal     Status: None   Collection Time: 06/20/22 12:56 AM   Specimen: Nasal Mucosa; Nasal Swab  Result Value Ref Range Status   MRSA by PCR Next Gen NOT DETECTED NOT DETECTED Final    Comment: (NOTE) The GeneXpert MRSA Assay (FDA approved for NASAL specimens only), is one component of a comprehensive MRSA colonization surveillance program. It is not intended to diagnose MRSA infection nor to guide or monitor treatment for MRSA infections. Test performance is not FDA approved in patients less than 64 years old. Performed at [facility], [address], [place], [place] [address]      Scheduled Meds:  (feeding supplement) PROSource Plus  30 mL Oral TID BM   calcitRIOL  0.25 mcg Oral Once per day on Mon Wed Fri   Chlorhexidine Gluconate Cloth  6 each Topical Q0600   feeding supplement  1 Container Oral TID BM   gabapentin  300 mg Oral QHS   heparin  5,000 Units Subcutaneous Q8H   hydrALAZINE  25 mg Oral BID   insulin aspart  0-5 Units Subcutaneous QHS    insulin aspart  0-6 Units Subcutaneous TID WC   insulin detemir  5 Units Subcutaneous Daily   levETIRAcetam  500 mg Oral BID   metoprolol succinate  100 mg Oral Daily   sodium bicarbonate  1,300 mg Oral BID   Continuous Infusions:  sodium chloride 125 mL/hr at 06/21/22 0907   cefTRIAXone (ROCEPHIN)  IV Stopped (06/20/22 2315)   methocarbamol (ROBAXIN) IV 500 mg (06/20/22 1027)    Procedures/Studies: MR LUMBAR SPINE WO CONTRAST  Result Date: 06/20/2022 CLINICAL DATA:  Low back pain and right leg weakness. T12 compression fracture. Pain since fall in January. EXAM: MRI LUMBAR SPINE WITHOUT CONTRAST TECHNIQUE: Multiplanar, multisequence MR imaging of the lumbar spine was performed. No intravenous contrast was administered. COMPARISON:  CT of the abdomen and pelvis 06/19/2022 and 05/26/2022 FINDINGS: Segmentation: 5 non rib-bearing lumbar type vertebral bodies are present. The lowest fully formed vertebral body is L5. Alignment: Slight retrolisthesis at L1-2 is stable. 3 mm of anterolisthesis is present at T11-12. No other significant listhesis is present. Lumbar lordosis is preserved. Vertebrae: A superior endplate fracture at N56 demonstrates fluid within the fracture cleft and some edema in the upper portion of the vertebral body and more extensively on the right. 40% loss of height is present anteriorly. No focal retropulsed bone is present although there is slight anterolisthesis at T11-12. Marrow signal at L1, L2, L3 and L4 is somewhat obscured by hardware. Vertebral body heights and marrow signal at T11, L5 and the sacrum are otherwise normal. Conus medullaris and cauda equina: Conus extends to the T12-L1 level. Conus and cauda equina appear normal. Paraspinal and other soft tissues: Limited imaging the abdomen is unremarkable. There is no significant adenopathy. No solid organ lesions are present. Disc levels: T12-L1: T11-12: Mild right foraminal narrowing is secondary to facet spurring. The  central canal is patent. T12-L1: Minimal disc bulging is present without significant stenosis. L1-2: Posterior hardware is in place. No significant stenosis is present. L2-3:  Lumbar fusion and discectomy noted. No residual or recurrent stenosis is present. L3-4: Discectomy and lumbar fusion noted. No residual or recurrent stenosis is present. L4-5: A mild broad-based disc protrusion is present. Moderate facet hypertrophy is noted bilaterally. The central canal is patent. Mild left subarticular narrowing and moderate left foraminal stenosis is present. Mild right foraminal narrowing is present. L5-S1: Mild asymmetric right-sided facet hypertrophy is present. No significant disc protrusion or stenosis is present. IMPRESSION: 1. Subacute to early chronic superior endplate fracture at Q25 with 40% loss of height anteriorly. No focal retropulsed bone is present. Residual edema is present in the vertebral body, worse on the right 2. Mild right foraminal narrowing at T12-L1 secondary to facet spurring. 3. Lumbar fusion at L1-2 and L3-4 without significant residual or recurrent stenosis at these levels. 4. Mild left subarticular and moderate left foraminal stenosis at L4-5. 5. Mild right foraminal narrowing at L4-5. 6. Mild asymmetric right-sided facet hypertrophy at L5-S1 without significant stenosis. Electronically Signed   By: San Morelle M.D.   On: 06/20/2022 12:41   MR BRAIN WO CONTRAST  Result Date: 06/20/2022 CLINICAL DATA:  Provided history: Neuro deficit, acute, stroke suspected; right leg weakness. EXAM: MRI HEAD WITHOUT CONTRAST TECHNIQUE: Multiplanar, multiecho pulse sequences of the brain and surrounding structures were obtained without intravenous contrast. COMPARISON:  Head CT 06/20/2022. Prior brain MRI examinations 03/09/2022 and earlier. FINDINGS: Mild intermittent motion degradation. Brain: Mild generalized parenchymal atrophy. 6 mm extra-axial dural-based mass overlying the anterior right  frontal lobe compatible with a small meningioma (series 12, image 25). Symmetric remote insults within the globus pallidus, bilaterally. Multifocal T2 FLAIR hyperintense signal abnormality within the cerebral white matter and pons, nonspecific but compatible with mild chronic small vessel ischemic disease. Tiny chronic infarcts within the left cerebellar hemisphere. There is no acute infarct. No evidence of an intracranial mass. No chronic intracranial blood products. No extra-axial fluid collection. No midline shift. Vascular: Maintained flow voids within the proximal large arterial vessels. Skull and upper cervical spine: No focal suspicious marrow lesion. Susceptibility artifact arising from ACDF hardware. Sinuses/Orbits: No mass or acute finding within the imaged orbits. No significant paranasal sinus disease. Other: Trace fluid within the left mastoid air cells. IMPRESSION: 1. Mildly motion degraded exam. 2. No evidence of acute intracranial abnormality. 3. Stable non-contrast MRI appearance of the brain. 4. 6 mm meningioma overlying the anterior right frontal lobe. 5. Symmetric foci of signal abnormality within the globus pallidus bilaterally, which may reflect chronic lacunar infarcts or sequelae of a prior toxic/metabolic insult. 6. Mild chronic small ischemic changes within the cerebral white matter and pons. 7. Tiny chronic infarcts within the left cerebellar hemisphere. 8. Mild generalized parenchymal atrophy. Electronically Signed   By: Kellie Simmering D.O.   On: 06/20/2022 12:37   CT HEAD WO CONTRAST (5MM)  Result Date: 06/20/2022 CLINICAL DATA:  71 year old female with altered mental status, right leg weakness. EXAM: CT HEAD WITHOUT CONTRAST TECHNIQUE: Contiguous axial images were obtained from the base of the skull through the vertex without intravenous contrast. RADIATION DOSE REDUCTION: This exam was performed according to the departmental dose-optimization program which includes automated exposure  control, adjustment of the mA and/or kV according to patient size and/or use of iterative reconstruction technique. COMPARISON:  Head CT 03/14/2022, brain MRI 03/09/2022. FINDINGS: Brain: Stable cerebral volume. No midline shift, mass effect, or evidence of intracranial mass lesion. No ventriculomegaly. No acute intracranial hemorrhage identified. Chronic lacunar infarct at the left genu internal capsule is stable  along with mild additional bilateral deep white matter heterogeneity. Preserved gray-white matter differentiation otherwise. No definite cortical encephalomalacia. Vascular: Extensive Calcified atherosclerosis at the skull base. No suspicious intracranial vascular hyperdensity. Skull: No acute osseous abnormality identified. Sinuses/Orbits: Visualized paranasal sinuses and mastoids are stable and well aerated. Other: No acute orbit or scalp soft tissue finding. IMPRESSION: No acute intracranial abnormality. Stable non contrast CT appearance of chronic small vessel disease. Electronically Signed   By: Genevie [name] M.D.   On: 06/20/2022 06:06   DG Chest 2 View  Result Date: 06/19/2022 CLINICAL DATA:  Chest pain EXAM: CHEST - 2 VIEW COMPARISON:  Radiographs 03/08/2022 FINDINGS: No focal consolidation, pleural effusion, or pneumothorax. Normal cardiomediastinal silhouette. No acute osseous abnormality. Chronic displaced fracture of the proximal right humerus. Aortic calcification. Cervical spine fusion hardware. IMPRESSION: No active cardiopulmonary disease. Electronically Signed   By: Placido Sou M.D.   On: 06/19/2022 20:25   CT ABDOMEN PELVIS WO CONTRAST  Result Date: 06/19/2022 CLINICAL DATA:  Abdominal pain. EXAM: CT ABDOMEN AND PELVIS WITHOUT CONTRAST TECHNIQUE: Multidetector CT imaging of the abdomen and pelvis was performed following the standard protocol without IV contrast. RADIATION DOSE REDUCTION: This exam was performed according to the departmental dose-optimization program which includes  automated exposure control, adjustment of the mA and/or kV according to patient size and/or use of iterative reconstruction technique. COMPARISON:  CT abdomen pelvis dated 05/26/2022. FINDINGS: Evaluation of this exam is limited in the absence of intravenous contrast. Lower chest: The visualized lung bases are clear. There is coronary vascular calcification and calcification of the mitral annulus. No intra-abdominal free air or free fluid. Hepatobiliary: The liver is unremarkable. No biliary dilatation. Cholecystectomy. No retained calcified stone noted in the central CBD. Pancreas: There is infiltration of the fat surrounding the distal body and tail of the pancreas with loss of fat plane between the pancreas and stomach similar to prior CT. Findings most consistent with scarring and adhesions sequela of chronic pancreatitis. Correlation with pancreatic enzymes recommended to evaluate for possibility of acute on chronic pancreatitis. No drainable fluid collection, abscess, or pseudocyst. Spleen: Normal in size without focal abnormality. Adrenals/Urinary Tract: The adrenal glands unremarkable. Mild bilateral renal parenchyma atrophy. There is a 2 mm nonobstructing right renal mid pole calculus. No hydronephrosis on either side. Subcentimeter exophytic left renal interpolar hypodense lesion is too small to characterize but appears similar to prior CT, likely a cyst. The visualized ureters and urinary bladder appear unremarkable. Stomach/Bowel: There is moderate stool throughout the colon. There is no bowel obstruction or active inflammation. The appendix is not visualized with certainty. No inflammatory changes identified in the right lower quadrant. Vascular/Lymphatic: Advanced aortoiliac atherosclerotic disease. The IVC is unremarkable. No portal venous gas. There is no adenopathy. Reproductive: Hysterectomy.  No adnexal masses. Other: None Musculoskeletal: There is osteopenia with degenerative changes spine and  multilevel posterior fusion hardware. Similar appearance of T12 compression fracture as the prior CT. No new fracture. IMPRESSION: 1. No bowel obstruction. 2. A 2 mm nonobstructing right renal mid pole calculus. No hydronephrosis. 3. Infiltration of the fat surrounding the distal body and tail of the pancreas with loss of fat plane between the pancreas and stomach similar to prior CT. Findings most consistent with scarring and adhesions sequela of chronic pancreatitis. Correlation with pancreatic enzymes recommended to evaluate for possibility of acute on chronic pancreatitis. No drainable fluid collection, abscess, or pseudocyst. 4. T12 compression fracture similar to prior CT. 5. Aortic Atherosclerosis (ICD10-I70.0). Electronically Signed  By: Anner Crete M.D.   On: 06/19/2022 20:23   CT ABDOMEN PELVIS WO CONTRAST  Result Date: 05/26/2022 CLINICAL DATA:  Left lower quadrant abdominal pain. Nausea and vomiting. EXAM: CT ABDOMEN AND PELVIS WITHOUT CONTRAST TECHNIQUE: Multidetector CT imaging of the abdomen and pelvis was performed following the standard protocol without IV contrast. RADIATION DOSE REDUCTION: This exam was performed according to the departmental dose-optimization program which includes automated exposure control, adjustment of the mA and/or kV according to patient size and/or use of iterative reconstruction technique. COMPARISON:  CT abdomen pelvis 01/07/2021. FINDINGS: Lower chest: Subtle scattered centrilobular ground-glass opacities in the visualized lower lobes and right middle lobe. No pleural effusion. A 0.5 cm solid nodule in the left lung base is stable compared to 03/02/2020, consistent with benignity (series 4, image 26). Hepatobiliary: No focal liver abnormality is seen. Status post cholecystectomy. No biliary dilatation. Pancreas: Subtle peripancreatic fat stranding is noted along the tail of the pancreas. No peripancreatic fluid collection. The pancreas does not appear  enlarged. Spleen: Normal in size. Numerous punctate calcifications may reflect calcified granulomas versus vascular calcifications. Adrenals/Urinary Tract: Adrenal glands are unremarkable. A 0.8 cm exophytic lesion at the posterior aspect of the middle third of the left kidney demonstrates attenuation of -23 Hounsfield units, consistent with a fat containing mass (series 2, image 36). No associated calcifications. The mass is stable compared to CT dated 02/05/2020 and is consistent with a stable angiomyolipoma; no additional dedicated follow-up imaging is indicated. A punctate nonobstructive calyceal stone is noted in the upper pole of the right kidney (series 6, image 55). Urinary bladder is minimally filled and unremarkable given degree of distention. Stomach/Bowel: Stomach is within normal limits. The appendix is not directly visualized, however there are no pericecal inflammatory changes. No evidence of bowel wall thickening, distention, or inflammatory changes. Vascular/Lymphatic: Aortic atherosclerosis. Severe calcific atherosclerosis at the origins of the renal artery and throughout the splenic artery. No enlarged abdominal or pelvic lymph nodes. Reproductive: Status post hysterectomy. A 1.4 cm benign right ovarian cyst is noted; no additional dedicated follow-up imaging is indicated. Other: No abdominal wall hernia or abnormality. No abdominopelvic ascites. Musculoskeletal: Compression fracture of the T12 vertebral body with approximately 50% vertebral height loss, new compared to chest radiograph 10/18/2021. No suspicious osseous findings. Bilateral pedicle screw and rod fixation spans L1-L4 with intervertebral spacers at L2-L3 and L3-L4. IMPRESSION: 1. Mild fat stranding surrounding the tail of the pancreas, suggestive of acute pancreatitis. No peripancreatic fluid collection. Recommend correlation with lipase levels. 2. Compression fracture of the T12 vertebral body, with approximately 50% vertebral body  height loss, of unknown chronicity. This finding is new since prior chest radiograph 10/18/2021. 3. Nonobstructive right nephrolithiasis. 4. Subtle centrilobular ground-glass opacities in the visualized lower lungs may reflect bronchiolitis or hypersensitivity pneumonitis. 5.  Aortic Atherosclerosis (ICD10-I70.0). Electronically Signed   By: Ileana Roup M.D.   On: 05/26/2022 15:07    Heather Eva, Heather [name]  [facility]  If 7PM-7AM, please contact night-coverage [email or web address] Password [email or web address] 06/21/2022, 2:30 PM   LOS: 2 days

## 2022-06-21 NOTE — Progress Notes (Signed)
Pt stated she vomited on this shift, no evidence of vomit noted, pt states she gets up and goes to the restroom to vomit, no evidence of vomit in restroom. I advised pt although she walks with no issues to please use call light system so staff can assist her. Bed alarm now placed. Pt has not had vomit episode on shift, diet advanced to soft diet, pt states she does not want it and only wants something to drink.

## 2022-06-21 NOTE — Progress Notes (Signed)
[facility] Progress Note   Subjective:   Still with abd pain.  Even ensure didn't sit right.  She is going to do ginger ale this AM.  Up to [facility] and reports no difficulty urinating and volume seems normal.  No edema, dyspnea, orthopnea.  No other new symptosm.   I/Os 3590 / 400 UOP  Objective Vitals:   06/20/22 1248 06/20/22 1358 06/20/22 1800 06/20/22 2049  BP: (!) 132/51 (!) 134/59 (!) 132/58 (!) 158/80  Pulse: (!) 102 95 95 100  Resp: (!) '21 20 20   '$ Temp:  98.1 F (36.7 C) 98.1 F (36.7 C) 98 F (36.7 C)  TempSrc:  Oral Oral Oral  SpO2: 100% 100% 99% 100%  Weight:  54.7 kg    Height:  '5\' 3"'$  (1.6 m)     Physical Exam General: chronically ill, nontoxic  Heart: HR 90s, no rub Lungs: clear to bases, on RA Abdomen: mildly TTP epigastrium, soft, no guarding Extremities: no edema  Additional Objective Labs: Basic Metabolic Panel: Recent Labs  Lab 06/19/22 1455 06/19/22 1859 06/20/22 0301 06/20/22 1025 06/21/22 0607  NA 128*   < > 134* 137 136  K >7.5*   < > 5.7* 4.9 5.1  CL 97*   < > 103 113* 107  CO2 20*   < > 21* 19* 20*  GLUCOSE 439*   < > 110* 101* 216*  BUN 68*   < > 69* 56* 55*  CREATININE 3.59*   < > 3.57* 2.99* 2.88*  CALCIUM 9.3   < > 9.0 7.0* 8.4*  PHOS 5.1*  --   --  4.5  --    < > = values in this interval not displayed.   Liver Function Tests: Recent Labs  Lab 06/20/22 0301 06/20/22 1025 06/21/22 0607  AST 40  --  145*  ALT 32  --  128*  ALKPHOS 130*  --  182*  BILITOT 0.5  --  0.3  PROT 6.7  --  5.8*  ALBUMIN 2.8* 2.1* 2.4*   Recent Labs  Lab 06/19/22 2143  LIPASE 96*   CBC: Recent Labs  Lab 06/19/22 1454 06/19/22 1859 06/20/22 0301 06/21/22 0607  WBC 10.4 6.9 9.7 5.9  NEUTROABS 8.6*  --  6.7  --   HGB 9.7* 8.7* 9.5* 8.6*  HCT 30.2* 26.4* 30.0* 26.3*  MCV 97.7 97.4 98.7 97.4  PLT 296 230 256 218   Blood Culture    Component Value Date/Time   SDES  05/26/2022 1400    URINE, CLEAN CATCH Performed at [facility], [address], [place], [place] [address]    [place]  05/26/2022 1400    NONE Performed at [facility], [address] [place], [place] [address]    CULT  05/26/2022 1400    NO GROWTH Performed at [place] [address] [place], [place] [address]    REPTSTATUS 05/29/2022 FINAL 05/26/2022 1400    Cardiac Enzymes: Recent Labs  Lab 06/20/22 1025  CKTOTAL 38   CBG: Recent Labs  Lab 06/20/22 0726 06/20/22 1105 06/20/22 1620 06/20/22 1643 06/20/22 2053  GLUCAP 96 123* 67* 83 132*   Iron Studies: No results for input(s): "IRON", "TIBC", "TRANSFERRIN", "FERRITIN" in the last 72 hours. '@lablastinr3'$ @ Studies/Results: MR LUMBAR SPINE WO CONTRAST  Result Date: 06/20/2022 CLINICAL DATA:  Low back pain and right leg weakness. T12 compression fracture. Pain since fall in January. EXAM: MRI LUMBAR SPINE WITHOUT CONTRAST TECHNIQUE: Multiplanar, multisequence MR imaging  of the lumbar spine was performed. No intravenous contrast was administered. COMPARISON:  CT of the abdomen and pelvis 06/19/2022 and 05/26/2022 FINDINGS: Segmentation: 5 non rib-bearing lumbar type vertebral bodies are present. The lowest fully formed vertebral body is L5. Alignment: Slight retrolisthesis at L1-2 is stable. 3 mm of anterolisthesis is present at T11-12. No other significant listhesis is present. Lumbar lordosis is preserved. Vertebrae: A superior endplate fracture at H06 demonstrates fluid within the fracture cleft and some edema in the upper portion of the vertebral body and more extensively on the right. 40% loss of height is present anteriorly. No focal retropulsed bone is present although there is slight anterolisthesis at T11-12. Marrow signal at L1, L2, L3 and L4 is somewhat obscured by hardware. Vertebral body heights and marrow signal at T11, L5 and the sacrum are otherwise normal. Conus medullaris and cauda equina: Conus extends to the T12-L1 level. Conus and cauda equina appear  normal. Paraspinal and other soft tissues: Limited imaging the abdomen is unremarkable. There is no significant adenopathy. No solid organ lesions are present. Disc levels: T12-L1: T11-12: Mild right foraminal narrowing is secondary to facet spurring. The central canal is patent. T12-L1: Minimal disc bulging is present without significant stenosis. L1-2: Posterior hardware is in place. No significant stenosis is present. L2-3: Lumbar fusion and discectomy noted. No residual or recurrent stenosis is present. L3-4: Discectomy and lumbar fusion noted. No residual or recurrent stenosis is present. L4-5: A mild broad-based disc protrusion is present. Moderate facet hypertrophy is noted bilaterally. The central canal is patent. Mild left subarticular narrowing and moderate left foraminal stenosis is present. Mild right foraminal narrowing is present. L5-S1: Mild asymmetric right-sided facet hypertrophy is present. No significant disc protrusion or stenosis is present. IMPRESSION: 1. Subacute to early chronic superior endplate fracture at C37 with 40% loss of height anteriorly. No focal retropulsed bone is present. Residual edema is present in the vertebral body, worse on the right 2. Mild right foraminal narrowing at T12-L1 secondary to facet spurring. 3. Lumbar fusion at L1-2 and L3-4 without significant residual or recurrent stenosis at these levels. 4. Mild left subarticular and moderate left foraminal stenosis at L4-5. 5. Mild right foraminal narrowing at L4-5. 6. Mild asymmetric right-sided facet hypertrophy at L5-S1 without significant stenosis. Electronically Signed   By: San Morelle M.D.   On: 06/20/2022 12:41   MR BRAIN WO CONTRAST  Result Date: 06/20/2022 CLINICAL DATA:  Provided history: Neuro deficit, acute, stroke suspected; right leg weakness. EXAM: MRI HEAD WITHOUT CONTRAST TECHNIQUE: Multiplanar, multiecho pulse sequences of the brain and surrounding structures were obtained without  intravenous contrast. COMPARISON:  Head CT 06/20/2022. Prior brain MRI examinations 03/09/2022 and earlier. FINDINGS: Mild intermittent motion degradation. Brain: Mild generalized parenchymal atrophy. 6 mm extra-axial dural-based mass overlying the anterior right frontal lobe compatible with a small meningioma (series 12, image 25). Symmetric remote insults within the globus pallidus, bilaterally. Multifocal T2 FLAIR hyperintense signal abnormality within the cerebral white matter and pons, nonspecific but compatible with mild chronic small vessel ischemic disease. Tiny chronic infarcts within the left cerebellar hemisphere. There is no acute infarct. No evidence of an intracranial mass. No chronic intracranial blood products. No extra-axial fluid collection. No midline shift. Vascular: Maintained flow voids within the proximal large arterial vessels. Skull and upper cervical spine: No focal suspicious marrow lesion. Susceptibility artifact arising from ACDF hardware. Sinuses/Orbits: No mass or acute finding within the imaged orbits. No significant paranasal sinus disease. Other: Trace fluid within the  left mastoid air cells. IMPRESSION: 1. Mildly motion degraded exam. 2. No evidence of acute intracranial abnormality. 3. Stable non-contrast MRI appearance of the brain. 4. 6 mm meningioma overlying the anterior right frontal lobe. 5. Symmetric foci of signal abnormality within the globus pallidus bilaterally, which may reflect chronic lacunar infarcts or sequelae of a prior toxic/metabolic insult. 6. Mild chronic small ischemic changes within the cerebral white matter and pons. 7. Tiny chronic infarcts within the left cerebellar hemisphere. 8. Mild generalized parenchymal atrophy. Electronically Signed   By: Kellie Simmering D.O.   On: 06/20/2022 12:37   CT HEAD WO CONTRAST (5MM)  Result Date: 06/20/2022 CLINICAL DATA:  71 year old female with altered mental status, right leg weakness. EXAM: CT HEAD WITHOUT CONTRAST  TECHNIQUE: Contiguous axial images were obtained from the base of the skull through the vertex without intravenous contrast. RADIATION DOSE REDUCTION: This exam was performed according to the departmental dose-optimization program which includes automated exposure control, adjustment of the mA and/or kV according to patient size and/or use of iterative reconstruction technique. COMPARISON:  Head CT 03/14/2022, brain MRI 03/09/2022. FINDINGS: Brain: Stable cerebral volume. No midline shift, mass effect, or evidence of intracranial mass lesion. No ventriculomegaly. No acute intracranial hemorrhage identified. Chronic lacunar infarct at the left genu internal capsule is stable along with mild additional bilateral deep white matter heterogeneity. Preserved gray-white matter differentiation otherwise. No definite cortical encephalomalacia. Vascular: Extensive Calcified atherosclerosis at the skull base. No suspicious intracranial vascular hyperdensity. Skull: No acute osseous abnormality identified. Sinuses/Orbits: Visualized paranasal sinuses and mastoids are stable and well aerated. Other: No acute orbit or scalp soft tissue finding. IMPRESSION: No acute intracranial abnormality. Stable non contrast CT appearance of chronic small vessel disease. Electronically Signed   By: Genevie [name] M.D.   On: 06/20/2022 06:06   DG Chest 2 View  Result Date: 06/19/2022 CLINICAL DATA:  Chest pain EXAM: CHEST - 2 VIEW COMPARISON:  Radiographs 03/08/2022 FINDINGS: No focal consolidation, pleural effusion, or pneumothorax. Normal cardiomediastinal silhouette. No acute osseous abnormality. Chronic displaced fracture of the proximal right humerus. Aortic calcification. Cervical spine fusion hardware. IMPRESSION: No active cardiopulmonary disease. Electronically Signed   By: Placido Sou M.D.   On: 06/19/2022 20:25   CT ABDOMEN PELVIS WO CONTRAST  Result Date: 06/19/2022 CLINICAL DATA:  Abdominal pain. EXAM: CT ABDOMEN AND PELVIS  WITHOUT CONTRAST TECHNIQUE: Multidetector CT imaging of the abdomen and pelvis was performed following the standard protocol without IV contrast. RADIATION DOSE REDUCTION: This exam was performed according to the departmental dose-optimization program which includes automated exposure control, adjustment of the mA and/or kV according to patient size and/or use of iterative reconstruction technique. COMPARISON:  CT abdomen pelvis dated 05/26/2022. FINDINGS: Evaluation of this exam is limited in the absence of intravenous contrast. Lower chest: The visualized lung bases are clear. There is coronary vascular calcification and calcification of the mitral annulus. No intra-abdominal free air or free fluid. Hepatobiliary: The liver is unremarkable. No biliary dilatation. Cholecystectomy. No retained calcified stone noted in the central CBD. Pancreas: There is infiltration of the fat surrounding the distal body and tail of the pancreas with loss of fat plane between the pancreas and stomach similar to prior CT. Findings most consistent with scarring and adhesions sequela of chronic pancreatitis. Correlation with pancreatic enzymes recommended to evaluate for possibility of acute on chronic pancreatitis. No drainable fluid collection, abscess, or pseudocyst. Spleen: Normal in size without focal abnormality. Adrenals/Urinary Tract: The adrenal glands unremarkable. Mild bilateral renal parenchyma  atrophy. There is a 2 mm nonobstructing right renal mid pole calculus. No hydronephrosis on either side. Subcentimeter exophytic left renal interpolar hypodense lesion is too small to characterize but appears similar to prior CT, likely a cyst. The visualized ureters and urinary bladder appear unremarkable. Stomach/Bowel: There is moderate stool throughout the colon. There is no bowel obstruction or active inflammation. The appendix is not visualized with certainty. No inflammatory changes identified in the right lower quadrant.  Vascular/Lymphatic: Advanced aortoiliac atherosclerotic disease. The IVC is unremarkable. No portal venous gas. There is no adenopathy. Reproductive: Hysterectomy.  No adnexal masses. Other: None Musculoskeletal: There is osteopenia with degenerative changes spine and multilevel posterior fusion hardware. Similar appearance of T12 compression fracture as the prior CT. No new fracture. IMPRESSION: 1. No bowel obstruction. 2. A 2 mm nonobstructing right renal mid pole calculus. No hydronephrosis. 3. Infiltration of the fat surrounding the distal body and tail of the pancreas with loss of fat plane between the pancreas and stomach similar to prior CT. Findings most consistent with scarring and adhesions sequela of chronic pancreatitis. Correlation with pancreatic enzymes recommended to evaluate for possibility of acute on chronic pancreatitis. No drainable fluid collection, abscess, or pseudocyst. 4. T12 compression fracture similar to prior CT. 5. Aortic Atherosclerosis (ICD10-I70.0). Electronically Signed   By: Anner Crete M.D.   On: 06/19/2022 20:23   Medications:  sodium chloride 150 mL/hr at 06/21/22 0502   cefTRIAXone (ROCEPHIN)  IV Stopped (06/20/22 2315)   methocarbamol (ROBAXIN) IV 500 mg (06/20/22 1027)    (feeding supplement) PROSource Plus  30 mL Oral TID BM   atorvastatin  80 mg Oral Daily   calcitRIOL  0.25 mcg Oral Once per day on Mon Wed Fri   Chlorhexidine Gluconate Cloth  6 each Topical Q0600   feeding supplement  1 Container Oral TID BM   gabapentin  300 mg Oral QHS   heparin  5,000 Units Subcutaneous Q8H   hydrALAZINE  25 mg Oral BID   insulin aspart  0-15 Units Subcutaneous TID WC   insulin aspart  0-5 Units Subcutaneous QHS   levETIRAcetam  500 mg Oral BID   metoprolol succinate  100 mg Oral Daily   sodium bicarbonate  1,300 mg Oral BID    Assessment/Plan:  AKI/CKD stage IV - baseline creatinine 2.'5mg'$ /dL, followed by Dr. Theador Hawthorne.  Now with AIK peak Cr 3.8 - presumably  ATN in setting of volume depletion.  No history of nephrotoxic agents.  Has had multiple episodes in the past and as recently as July.  Agree with continuing with IVF's as BUN/Cr are improving and she isn't tolerating good po intake yet -- will decrease rate from 150/hr to 125/hr.   Continue to hold KCl for now.  Cont daily labs.  Avoid nephrotoxic medications including NSAIDs and iodinated intravenous contrast exposure unless the latter is absolutely indicated. Preferred narcotic agents for pain control are hydromorphone, fentanyl, and methadone. Morphine should not be used.  Avoid Baclofen and avoid oral sodium phosphate and magnesium citrate based laxatives / bowel preps.  Continue strict Input and Output monitoring. Will monitor the patient closely with you and intervene or adjust therapy as indicated by changes in clinical status/labs   Hyperkalemia - due to AKI and KCl supplements. Now resolved but K remains high normal at 5.1.    Acute on chronic pancreatitis - still with abdominal pain.  Plan per primary. DM type 2 - uncontrolled. Per primary Crohn's diseae - per primary. Transaminitis - was  normal on admission, now with AST 145/ALT 128 - 8/22 imaging ok, further eval per primary.  Secondary hyperPTH - 05/17/2022 PTH 91 from 515 in 07/2021, Corr Ca ok - cont outpt dose of calcitriol.  Jannifer Hick MD 06/21/2022, 8:04 AM  [facility] Pager: [phone]

## 2022-06-22 DIAGNOSIS — E875 Hyperkalemia: Secondary | ICD-10-CM | POA: Diagnosis not present

## 2022-06-22 DIAGNOSIS — K861 Other chronic pancreatitis: Secondary | ICD-10-CM

## 2022-06-22 DIAGNOSIS — K859 Acute pancreatitis without necrosis or infection, unspecified: Secondary | ICD-10-CM

## 2022-06-22 DIAGNOSIS — N179 Acute kidney failure, unspecified: Secondary | ICD-10-CM | POA: Diagnosis not present

## 2022-06-22 LAB — URINE CULTURE: Culture: >=100000 — AB

## 2022-06-22 LAB — RENAL FUNCTION PANEL
Albumin: 2.2 g/dL — ABNORMAL LOW (ref 3.5–5.0)
Anion gap: 3 — ABNORMAL LOW (ref 5–15)
BUN: 45 mg/dL — ABNORMAL HIGH (ref 8–23)
CO2: 23 mmol/L (ref 22–32)
Calcium: 8 mg/dL — ABNORMAL LOW (ref 8.9–10.3)
Chloride: 112 mmol/L — ABNORMAL HIGH (ref 98–111)
Creatinine, Ser: 2.13 mg/dL — ABNORMAL HIGH (ref 0.44–1.00)
GFR, Estimated: 25 mL/min — ABNORMAL LOW (ref >60)
Glucose, Bld: 217 mg/dL — ABNORMAL HIGH (ref 70–99)
Phosphorus: 3.1 mg/dL (ref 2.5–4.6)
Potassium: 4.8 mmol/L (ref 3.5–5.1)
Sodium: 138 mmol/L (ref 135–145)

## 2022-06-22 LAB — HEPATIC FUNCTION PANEL
ALT: 77 U/L — ABNORMAL HIGH (ref 0–44)
AST: 62 U/L — ABNORMAL HIGH (ref 15–41)
Albumin: 2.2 g/dL — ABNORMAL LOW (ref 3.5–5.0)
Alkaline Phosphatase: 164 U/L — ABNORMAL HIGH (ref 38–126)
Bilirubin, Direct: 0.1 mg/dL (ref 0.0–0.2)
Indirect Bilirubin: 0.3 mg/dL (ref 0.3–0.9)
Total Bilirubin: 0.4 mg/dL (ref 0.3–1.2)
Total Protein: 5.4 g/dL — ABNORMAL LOW (ref 6.5–8.1)

## 2022-06-22 LAB — HEPATITIS C ANTIBODY: HCV Ab: NONREACTIVE

## 2022-06-22 LAB — LIPASE, BLOOD: Lipase: 34 U/L (ref 11–51)

## 2022-06-22 LAB — GLUCOSE, CAPILLARY
Glucose-Capillary: 83 mg/dL (ref 70–99)
Glucose-Capillary: 205 mg/dL — ABNORMAL HIGH (ref 70–99)
Glucose-Capillary: 229 mg/dL — ABNORMAL HIGH (ref 70–99)

## 2022-06-22 LAB — HEPATITIS B SURFACE ANTIGEN: Hepatitis B Surface Ag: NONREACTIVE

## 2022-06-22 MED ORDER — CEFDINIR 300 MG PO CAPS: 300.0000 mg | ORAL_CAPSULE | Freq: Two times a day (BID) | ORAL | 0 refills | Status: DC

## 2022-06-22 MED ORDER — ALLOPURINOL 100 MG PO TABS
100.0000 mg | ORAL_TABLET | Freq: Every day | ORAL | Status: DC
  Administered 2022-06-22: 100 mg via ORAL
  Filled 2022-06-22: qty 1

## 2022-06-22 MED ORDER — CEFDINIR 300 MG PO CAPS
300.0000 mg | ORAL_CAPSULE | Freq: Every day | ORAL | Status: DC
  Administered 2022-06-22: 300 mg via ORAL
  Filled 2022-06-22: qty 1

## 2022-06-22 MED ORDER — CEFDINIR 300 MG PO CAPS: 300.0000 mg | ORAL_CAPSULE | Freq: Two times a day (BID) | ORAL | Status: DC

## 2022-06-22 MED ORDER — HYDRALAZINE HCL 50 MG PO TABS: 50.0000 mg | ORAL_TABLET | Freq: Two times a day (BID) | ORAL | 1 refills | Status: DC

## 2022-06-22 MED ORDER — FUROSEMIDE 40 MG PO TABS: 40.0000 mg | ORAL_TABLET | Freq: Every day | ORAL | Status: DC

## 2022-06-22 MED ORDER — ATORVASTATIN CALCIUM 80 MG PO TABS: 80.0000 mg | ORAL_TABLET | Freq: Every day | ORAL | Status: DC

## 2022-06-22 MED ORDER — CEFDINIR 300 MG PO CAPS: 300.0000 mg | ORAL_CAPSULE | Freq: Every day | ORAL | Status: DC

## 2022-06-22 NOTE — Progress Notes (Signed)
[facility] Progress Note   Subjective:   Feeling well and for d/c today. Up to [facility] and reports no difficulty urinating and volume seems normal.  No edema, dyspnea, orthopnea.  No other new symptoms.   I/Os 1180 / no outs recorded - says normal volume  Objective Vitals:   06/21/22 0827 06/21/22 1307 06/21/22 2223 06/22/22 0558  BP: (!) 163/66 (!) 176/79 (!) 154/61 (!) 172/60  Pulse:  95 95 93  Resp:  '17 20 20  '$ Temp:  98.1 F (36.7 C) 98.7 F (37.1 C) 98.6 F (37 C)  TempSrc:      SpO2:  100% 100% 99%  Weight:      Height:       Physical Exam General: chronically ill, nontoxic  Heart: HR 90s, no rub Lungs: clear to bases, on RA Abdomen: mildly TTP epigastrium, soft, no guarding  Extremities: no edema  Additional Objective Labs: Basic Metabolic Panel: Recent Labs  Lab 06/19/22 1455 06/19/22 1859 06/20/22 1025 06/21/22 0607 06/22/22 0416  NA 128*   < > 137 136 138  K >7.5*   < > 4.9 5.1 4.8  CL 97*   < > 113* 107 112*  CO2 20*   < > 19* 20* 23  GLUCOSE 439*   < > 101* 216* 217*  BUN 68*   < > 56* 55* 45*  CREATININE 3.59*   < > 2.99* 2.88* 2.13*  CALCIUM 9.3   < > 7.0* 8.4* 8.0*  PHOS 5.1*  --  4.5  --  3.1   < > = values in this interval not displayed.    Liver Function Tests: Recent Labs  Lab 06/20/22 0301 06/20/22 1025 06/21/22 0607 06/22/22 0416  AST 40  --  145* 62*  ALT 32  --  128* 77*  ALKPHOS 130*  --  182* 164*  BILITOT 0.5  --  0.3 0.4  PROT 6.7  --  5.8* 5.4*  ALBUMIN 2.8* 2.1* 2.4* 2.2*  2.2*    Recent Labs  Lab 06/19/22 2143 06/22/22 0416  LIPASE 96* 34    CBC: Recent Labs  Lab 06/19/22 1454 06/19/22 1859 06/20/22 0301 06/21/22 0607  WBC 10.4 6.9 9.7 5.9  NEUTROABS 8.6*  --  6.7  --   HGB 9.7* 8.7* 9.5* 8.6*  HCT 30.2* 26.4* 30.0* 26.3*  MCV 97.7 97.4 98.7 97.4  PLT 296 230 256 218    Blood Culture    Component Value Date/Time   SDES  06/19/2022 2103    URINE, CLEAN CATCH Performed at [facility], [address], [place], [place] [address]    [place]  06/19/2022 2103    NONE Performed at [facility], [address] [place],  [address]    CULT >=100,000 COLONIES/mL ESCHERICHIA COLI (A) 06/19/2022 2103   REPTSTATUS 06/22/2022 FINAL 06/19/2022 2103    Cardiac Enzymes: Recent Labs  Lab 06/20/22 1025  CKTOTAL 38    CBG: Recent Labs  Lab 06/21/22 1139 06/21/22 1738 06/21/22 2143 06/22/22 0200 06/22/22 0736  GLUCAP 157* 229* 106* 83 205*    Iron Studies: No results for input(s): "IRON", "TIBC", "TRANSFERRIN", "FERRITIN" in the last 72 hours. '@lablastinr3'$ @ Studies/Results: US Abdomen Limited RUQ (LIVER/GB)  Result Date: 06/21/2022 CLINICAL DATA:  Elevated LFTs EXAM: ULTRASOUND ABDOMEN LIMITED RIGHT UPPER QUADRANT COMPARISON:  None Available. FINDINGS: Gallbladder: Gallbladder is surgically absent. Common bile duct: Diameter: 8 mm, within normal limits status post cholecystectomy. Liver: No focal lesion identified. Increased parenchymal echogenicity.  Portal vein is patent on color Doppler imaging with normal direction of blood flow towards the liver. Other: None. IMPRESSION: Hepatic steatosis. Electronically Signed   By: Yetta Glassman M.D.   On: 06/21/2022 16:04   MR LUMBAR SPINE WO CONTRAST  Result Date: 06/20/2022 CLINICAL DATA:  Low back pain and right leg weakness. T12 compression fracture. Pain since fall in January. EXAM: MRI LUMBAR SPINE WITHOUT CONTRAST TECHNIQUE: Multiplanar, multisequence MR imaging of the lumbar spine was performed. No intravenous contrast was administered. COMPARISON:  CT of the abdomen and pelvis 06/19/2022 and 05/26/2022 FINDINGS: Segmentation: 5 non rib-bearing lumbar type vertebral bodies are present. The lowest fully formed vertebral body is L5. Alignment: Slight retrolisthesis at L1-2 is stable. 3 mm of anterolisthesis is present at T11-12. No other significant listhesis is present. Lumbar lordosis is preserved. Vertebrae: A  superior endplate fracture at S28 demonstrates fluid within the fracture cleft and some edema in the upper portion of the vertebral body and more extensively on the right. 40% loss of height is present anteriorly. No focal retropulsed bone is present although there is slight anterolisthesis at T11-12. Marrow signal at L1, L2, L3 and L4 is somewhat obscured by hardware. Vertebral body heights and marrow signal at T11, L5 and the sacrum are otherwise normal. Conus medullaris and cauda equina: Conus extends to the T12-L1 level. Conus and cauda equina appear normal. Paraspinal and other soft tissues: Limited imaging the abdomen is unremarkable. There is no significant adenopathy. No solid organ lesions are present. Disc levels: T12-L1: T11-12: Mild right foraminal narrowing is secondary to facet spurring. The central canal is patent. T12-L1: Minimal disc bulging is present without significant stenosis. L1-2: Posterior hardware is in place. No significant stenosis is present. L2-3: Lumbar fusion and discectomy noted. No residual or recurrent stenosis is present. L3-4: Discectomy and lumbar fusion noted. No residual or recurrent stenosis is present. L4-5: A mild broad-based disc protrusion is present. Moderate facet hypertrophy is noted bilaterally. The central canal is patent. Mild left subarticular narrowing and moderate left foraminal stenosis is present. Mild right foraminal narrowing is present. L5-S1: Mild asymmetric right-sided facet hypertrophy is present. No significant disc protrusion or stenosis is present. IMPRESSION: 1. Subacute to early chronic superior endplate fracture at B15 with 40% loss of height anteriorly. No focal retropulsed bone is present. Residual edema is present in the vertebral body, worse on the right 2. Mild right foraminal narrowing at T12-L1 secondary to facet spurring. 3. Lumbar fusion at L1-2 and L3-4 without significant residual or recurrent stenosis at these levels. 4. Mild left  subarticular and moderate left foraminal stenosis at L4-5. 5. Mild right foraminal narrowing at L4-5. 6. Mild asymmetric right-sided facet hypertrophy at L5-S1 without significant stenosis. Electronically Signed   By: San Morelle M.D.   On: 06/20/2022 12:41   MR BRAIN WO CONTRAST  Result Date: 06/20/2022 CLINICAL DATA:  Provided history: Neuro deficit, acute, stroke suspected; right leg weakness. EXAM: MRI HEAD WITHOUT CONTRAST TECHNIQUE: Multiplanar, multiecho pulse sequences of the brain and surrounding structures were obtained without intravenous contrast. COMPARISON:  Head CT 06/20/2022. Prior brain MRI examinations 03/09/2022 and earlier. FINDINGS: Mild intermittent motion degradation. Brain: Mild generalized parenchymal atrophy. 6 mm extra-axial dural-based mass overlying the anterior right frontal lobe compatible with a small meningioma (series 12, image 25). Symmetric remote insults within the globus pallidus, bilaterally. Multifocal T2 FLAIR hyperintense signal abnormality within the cerebral white matter and pons, nonspecific but compatible with mild chronic small vessel ischemic disease. Tiny  chronic infarcts within the left cerebellar hemisphere. There is no acute infarct. No evidence of an intracranial mass. No chronic intracranial blood products. No extra-axial fluid collection. No midline shift. Vascular: Maintained flow voids within the proximal large arterial vessels. Skull and upper cervical spine: No focal suspicious marrow lesion. Susceptibility artifact arising from ACDF hardware. Sinuses/Orbits: No mass or acute finding within the imaged orbits. No significant paranasal sinus disease. Other: Trace fluid within the left mastoid air cells. IMPRESSION: 1. Mildly motion degraded exam. 2. No evidence of acute intracranial abnormality. 3. Stable non-contrast MRI appearance of the brain. 4. 6 mm meningioma overlying the anterior right frontal lobe. 5. Symmetric foci of signal abnormality  within the globus pallidus bilaterally, which may reflect chronic lacunar infarcts or sequelae of a prior toxic/metabolic insult. 6. Mild chronic small ischemic changes within the cerebral white matter and pons. 7. Tiny chronic infarcts within the left cerebellar hemisphere. 8. Mild generalized parenchymal atrophy. Electronically Signed   By: Kellie Simmering D.O.   On: 06/20/2022 12:37   Medications:  sodium chloride 125 mL/hr at 06/21/22 2350   cefTRIAXone (ROCEPHIN)  IV 1 g (06/21/22 2155)   methocarbamol (ROBAXIN) IV 500 mg (06/20/22 1027)    (feeding supplement) PROSource Plus  30 mL Oral TID BM   allopurinol  100 mg Oral Daily   calcitRIOL  0.25 mcg Oral Once per day on Mon Wed Fri   Chlorhexidine Gluconate Cloth  6 each Topical Q0600   feeding supplement  1 Container Oral TID BM   gabapentin  300 mg Oral QHS   heparin  5,000 Units Subcutaneous Q8H   hydrALAZINE  50 mg Oral BID   insulin aspart  0-5 Units Subcutaneous QHS   insulin aspart  0-6 Units Subcutaneous TID WC   insulin detemir  5 Units Subcutaneous Daily   levETIRAcetam  500 mg Oral BID   metoprolol succinate  100 mg Oral Daily   sodium bicarbonate  1,300 mg Oral BID    Assessment/Plan:  AKI/CKD stage IV - baseline creatinine 2.'5mg'$ /dL, followed by Dr. Theador Hawthorne.  Now with AIK peak Cr 3.8 - presumably ATN in setting of volume depletion.  No history of nephrotoxic agents.  Has had multiple episodes in the past and as recently as July.  Cr this AM 2.1 better than baseline.  Continue to hold KCl for now given K 4.8.  OK for discharge.  Avoid nephrotoxic medications including NSAIDs and iodinated intravenous contrast exposure unless the latter is absolutely indicated. Preferred narcotic agents for pain control are hydromorphone, fentanyl, and methadone. Morphine should not be used.  Avoid Baclofen and avoid oral sodium phosphate and magnesium citrate based laxatives / bowel preps.  Continue strict Input and Output monitoring. Will  monitor the patient closely with you and intervene or adjust therapy as indicated by changes in clinical status/labs   Hyperkalemia - due to AKI and KCl supplements. Now resolved but K remains 4.8 - cont to hold supplement. Acute on chronic pancreatitis - symptoms resolved - for d/c today DM type 2 - uncontrolled. Per primary Crohn's disease - per primary. Transaminitis - was normal on admission, now with AST 145/ALT 128 - 8/22 imaging ok, further eval per primary.  Secondary hyperPTH - 05/17/2022 PTH 91 from 515 in 07/2021, Corr Ca ok - cont outpt dose of calcitriol.  Kidney function back to baseline, nothing further to add.  [place] for discharge from my perspective.  Should see primary nephrologist after d/c - 4 wks interval should  be sufficient and she says she is seeing Dr. Theador Hawthorne soon.  Jannifer Hick MD 06/22/2022, 9:52 AM  [name] Pager: [phone]

## 2022-06-22 NOTE — Discharge Summary (Signed)
Physician Discharge Summary   Patient: Heather [name] MRN: [id number] DOB: 1950-12-30  Admit date:     06/19/2022  Discharge date: 06/22/22  Discharge Physician: Shanon Brow Philo Kurtz   PCP: [name], MD   Recommendations at discharge:   Please follow up with primary care provider within 1-2 weeks  Please repeat BMP and CBC in one week    Hospital Course: 71 y/o female with a history of diabetes mellitus type 2, hyperlipidemia, Crohn's disease, CKD stage 4, B12 deficiency, anxiety disorder, stroke, HTN, chronic pancreatitis, mixed hyperlipidemia presenting after she was notified by her PCP to go to the ED for abnormal labs. The patient is a difficult historian, but she is able to give generalities.  The details of her history are somewhat conflicting.  Nevertheless, it appears that the patient has been having nausea with worsening abdominal pain for the past 3 days. She denies any f/c, vomiting, diarrhea, dysuria.  She complains of right leg weakness which seems to have started in late Jan 2023 after a fall.  She also has frequent falls at home.  She also complains of low back pain which seems to be somewhat exacerbated by her recent falls.  She denies syncope.  She denies any hematochezia or melena.   In the ED, the patient was afebrile and hemodynamically stable with oxygen saturation 100% room air.  Initial BMP showed a potassium > 7.5 with serum creatinine 3.77.  LFTs were unremarkable.  WBC 10.4, hemoglobin 9.7, platelets 296,000.    Assessment and Plan: Acute on chronic pancreatitis -Lipase 96 -06/19/2022 CT abdomen--fatty infiltration of the body and tail of the pancreas, old T12 compression fracture -continue clear liquid diet--pt not ready for diet advancement yet>>still nauseous with abd pain -Judicious opioids -triglycerides 116, LDL 48 -overall improved -although pt claims n/v--none witnessed by staff and pt seen with solid foot eaten   Acute on chronic renal failure--CKD  4 -Baseline creatinine 2.3-2.7 -Secondary to volume depletion -Continue IV fluids>>improving -no hydronephrosis on CT abd Serum creatinine 2.13 on day of dc -decrease home dose lasix to once daily and follow up with Dr. Theador Hawthorne   Uncontrolled diabetes mellitus type 2 with hyperglycemia -Presented with CBGs in the 400s -06/20/22 hemoglobin A1c 10.0 -NovoLog sliding scale--decrease to very sensitive scale -started levemir 5 units   E Coli UTI -continue ceftriaxone -d/c home with cefdinir x 3 more days   Hyperkalemia -Continue Lokelma>>improving -Continued IV fluids>>improved -Remain on telemetry -stopped K supplemtes   Mixed hyperlipidemia -hold statin due to elevated LFTs -resume as outpt as LFTs improved   Right leg weakness -Check CPK--38 -CT brain neg -MRI lumbar spine--Subacute to early chronic superior endplate fracture at H85 with 40% loss of height anteriorly, multilevel DJD without cord impingement.   Visual disturbance -MRI brain--neg for acute findings;6 mm meningioma overlying the anterior right frontal lobe   Essential hypertension -Continue metoprolol succinate and hydralazine -increased hydralazine dose   Crohn's Disease -no active symptoms presently -oupt GI followup    Hypomagnesesmia -replete judiciously   Transaminasemia -RUQ US--neg -trend--improving -hep B surface antigen--pending at time of dc -hep C antibody--pending at time of dc -d/c lipitor>>resume after dc         Consultants: renal Procedures performed: none  Disposition: Home Diet recommendation:  Carb modified diet DISCHARGE MEDICATION: Allergies as of 06/22/2022       Reactions   Cozaar [losartan] Nausea And Vomiting, Swelling, Rash   Quinolones Itching, Nausea And Vomiting, Nausea Only, Swelling   Swelling of  face, jaw, and lips   Cymbalta [duloxetine Hcl] Itching, Swelling   Sulfa Antibiotics Nausea And Vomiting, Swelling   Headache  Swelling of feet, legs    Synalar [fluocinolone] Swelling   Cipro [ciprofloxacin Hcl] Nausea And Vomiting, Swelling   Swelling of face, jaw, lips   Diovan [valsartan] Nausea And Vomiting, Swelling   Glucophage [metformin] Other (See Comments)   GI upset   Ketalar [ketamine] Swelling   Norco [hydrocodone-acetaminophen] Itching, Swelling   Norvasc [amlodipine Besylate] Itching, Swelling   Fluid retention    Nsaids Nausea And Vomiting, Other (See Comments)   Has bleeding ulcers   Tylenol [acetaminophen] Itching, Swelling   Tape Itching, Rash   Paper tape can only be used on this patient   Zestril [lisinopril] Swelling, Rash   Oral swelling Red streaks on arms/legs        Medication List     STOP taking these medications    potassium chloride SA 20 MEQ tablet Commonly known as: KLOR-CON M       TAKE these medications    acetaminophen 325 MG tablet Commonly known as: TYLENOL Take 2 tablets (650 mg total) by mouth every 4 (four) hours.   allopurinol 100 MG tablet Commonly known as: ZYLOPRIM Take 100 mg by mouth daily.   atorvastatin 80 MG tablet Commonly known as: LIPITOR Take 1 tablet (80 mg total) by mouth daily. Restart on 06/29/22 What changed: additional instructions   busPIRone 10 MG tablet Commonly known as: BUSPAR Take 10 mg by mouth 3 (three) times daily as needed for anxiety.   calcitRIOL 0.25 MCG capsule Commonly known as: ROCALTROL Take 0.25 mcg by mouth 3 (three) times a week. Monday,Wed and Fri.   calcium carbonate 1250 (500 Ca) MG chewable tablet Commonly known as: OS-CAL Chew 1 tablet by mouth 2 (two) times daily.   cefdinir 300 MG capsule Commonly known as: OMNICEF Take 1 capsule (300 mg total) by mouth daily.   cyanocobalamin 1000 MCG/ML injection Commonly known as: VITAMIN B12 Inject 1,000 mcg into the muscle every 30 (thirty) days.   diazepam 5 MG tablet Commonly known as: VALIUM Take 1 tablet (5 mg total) by mouth every 12 (twelve) hours as needed for  anxiety.   dicyclomine 20 MG tablet Commonly known as: BENTYL Take 1 tablet (20 mg total) by mouth in the morning and at bedtime.   diphenhydrAMINE 2 % cream Commonly known as: BENADRYL Apply topically 3 (three) times daily as needed for itching.   esomeprazole 40 MG capsule Commonly known as: NEXIUM Take 40 mg by mouth daily before breakfast.   furosemide 40 MG tablet Commonly known as: LASIX Take 1 tablet (40 mg total) by mouth daily. What changed: when to take this   gabapentin 300 MG capsule Commonly known as: NEURONTIN Take 1 capsule (300 mg total) by mouth at bedtime.   hydrALAZINE 50 MG tablet Commonly known as: APRESOLINE Take 1 tablet (50 mg total) by mouth 2 (two) times daily. What changed:  medication strength how much to take when to take this   insulin aspart 100 UNIT/ML FlexPen Commonly known as: NOVOLOG Inject 0-12 Units into the skin 3 (three) times daily with meals. insulin aspart (novoLOG) injection 0-12 Units 0-12 Units Subcutaneous, 3 times daily with meals CBG < 70: Implement Hypoglycemia Standing Orders and refer to Hypoglycemia Standing Orders sidebar report  CBG 70 - 120: 0 unit CBG 121 - 150: 0 unit  CBG 151 - 200: 2 unit CBG 201 - 250:  4 units CBG 251 - 300: 6 units CBG 301 - 350: 8 units  CBG 351 - 400: 10 units  CBG > 400: 12 units   Levemir FlexPen 100 UNIT/ML FlexPen Generic drug: insulin detemir Inject 10 Units into the skin at bedtime.   levETIRAcetam 500 MG tablet Commonly known as: KEPPRA Take 500 mg by mouth 2 (two) times daily.   metoprolol succinate 100 MG 24 hr tablet Commonly known as: Toprol XL Take 1 tablet (100 mg total) by mouth daily. Take with or immediately following a meal.   polyethylene glycol 17 g packet Commonly known as: MIRALAX / GLYCOLAX Take 17 g by mouth daily. What changed:  when to take this reasons to take this   sodium bicarbonate 650 MG tablet Take 2 tablets (1,300 mg total) by mouth 2 (two) times  daily. For kidneys   vitamin C with rose hips 500 MG tablet Take 500 mg by mouth every morning.   Vitamin D 50 MCG (2000 UT) Caps Take 2,000 Units by mouth every morning.        Discharge Exam: Filed Weights   06/20/22 0113 06/20/22 0453 06/20/22 1358  Weight: 54.2 kg 54.2 kg 54.7 kg  HEENT:  [place]/AT, No thrush, no icterus CV:  RRR, no rub, no S3, no S4 Lung:  CTA, no wheeze, no rhonchi Abd:  soft/+BS, NT Ext:  No edema, no lymphangitis, no synovitis, no rash   Condition at discharge: stable  The results of significant diagnostics from this hospitalization (including imaging, microbiology, ancillary and laboratory) are listed below for reference.   Imaging Studies: US Abdomen Limited RUQ (LIVER/GB)  Result Date: 06/21/2022 CLINICAL DATA:  Elevated LFTs EXAM: ULTRASOUND ABDOMEN LIMITED RIGHT UPPER QUADRANT COMPARISON:  None Available. FINDINGS: Gallbladder: Gallbladder is surgically absent. Common bile duct: Diameter: 8 mm, within normal limits status post cholecystectomy. Liver: No focal lesion identified. Increased parenchymal echogenicity. Portal vein is patent on color Doppler imaging with normal direction of blood flow towards the liver. Other: None. IMPRESSION: Hepatic steatosis. Electronically Signed   By: Yetta Glassman M.D.   On: 06/21/2022 16:04   MR LUMBAR SPINE WO CONTRAST  Result Date: 06/20/2022 CLINICAL DATA:  Low back pain and right leg weakness. T12 compression fracture. Pain since fall in January. EXAM: MRI LUMBAR SPINE WITHOUT CONTRAST TECHNIQUE: Multiplanar, multisequence MR imaging of the lumbar spine was performed. No intravenous contrast was administered. COMPARISON:  CT of the abdomen and pelvis 06/19/2022 and 05/26/2022 FINDINGS: Segmentation: 5 non rib-bearing lumbar type vertebral bodies are present. The lowest fully formed vertebral body is L5. Alignment: Slight retrolisthesis at L1-2 is stable. 3 mm of anterolisthesis is present at T11-12. No other  significant listhesis is present. Lumbar lordosis is preserved. Vertebrae: A superior endplate fracture at U13 demonstrates fluid within the fracture cleft and some edema in the upper portion of the vertebral body and more extensively on the right. 40% loss of height is present anteriorly. No focal retropulsed bone is present although there is slight anterolisthesis at T11-12. Marrow signal at L1, L2, L3 and L4 is somewhat obscured by hardware. Vertebral body heights and marrow signal at T11, L5 and the sacrum are otherwise normal. Conus medullaris and cauda equina: Conus extends to the T12-L1 level. Conus and cauda equina appear normal. Paraspinal and other soft tissues: Limited imaging the abdomen is unremarkable. There is no significant adenopathy. No solid organ lesions are present. Disc levels: T12-L1: T11-12: Mild right foraminal narrowing is secondary to facet spurring. The  central canal is patent. T12-L1: Minimal disc bulging is present without significant stenosis. L1-2: Posterior hardware is in place. No significant stenosis is present. L2-3: Lumbar fusion and discectomy noted. No residual or recurrent stenosis is present. L3-4: Discectomy and lumbar fusion noted. No residual or recurrent stenosis is present. L4-5: A mild broad-based disc protrusion is present. Moderate facet hypertrophy is noted bilaterally. The central canal is patent. Mild left subarticular narrowing and moderate left foraminal stenosis is present. Mild right foraminal narrowing is present. L5-S1: Mild asymmetric right-sided facet hypertrophy is present. No significant disc protrusion or stenosis is present. IMPRESSION: 1. Subacute to early chronic superior endplate fracture at M27 with 40% loss of height anteriorly. No focal retropulsed bone is present. Residual edema is present in the vertebral body, worse on the right 2. Mild right foraminal narrowing at T12-L1 secondary to facet spurring. 3. Lumbar fusion at L1-2 and L3-4 without  significant residual or recurrent stenosis at these levels. 4. Mild left subarticular and moderate left foraminal stenosis at L4-5. 5. Mild right foraminal narrowing at L4-5. 6. Mild asymmetric right-sided facet hypertrophy at L5-S1 without significant stenosis. Electronically Signed   By: San Morelle M.D.   On: 06/20/2022 12:41   MR BRAIN WO CONTRAST  Result Date: 06/20/2022 CLINICAL DATA:  Provided history: Neuro deficit, acute, stroke suspected; right leg weakness. EXAM: MRI HEAD WITHOUT CONTRAST TECHNIQUE: Multiplanar, multiecho pulse sequences of the brain and surrounding structures were obtained without intravenous contrast. COMPARISON:  Head CT 06/20/2022. Prior brain MRI examinations 03/09/2022 and earlier. FINDINGS: Mild intermittent motion degradation. Brain: Mild generalized parenchymal atrophy. 6 mm extra-axial dural-based mass overlying the anterior right frontal lobe compatible with a small meningioma (series 12, image 25). Symmetric remote insults within the globus pallidus, bilaterally. Multifocal T2 FLAIR hyperintense signal abnormality within the cerebral white matter and pons, nonspecific but compatible with mild chronic small vessel ischemic disease. Tiny chronic infarcts within the left cerebellar hemisphere. There is no acute infarct. No evidence of an intracranial mass. No chronic intracranial blood products. No extra-axial fluid collection. No midline shift. Vascular: Maintained flow voids within the proximal large arterial vessels. Skull and upper cervical spine: No focal suspicious marrow lesion. Susceptibility artifact arising from ACDF hardware. Sinuses/Orbits: No mass or acute finding within the imaged orbits. No significant paranasal sinus disease. Other: Trace fluid within the left mastoid air cells. IMPRESSION: 1. Mildly motion degraded exam. 2. No evidence of acute intracranial abnormality. 3. Stable non-contrast MRI appearance of the brain. 4. 6 mm meningioma overlying  the anterior right frontal lobe. 5. Symmetric foci of signal abnormality within the globus pallidus bilaterally, which may reflect chronic lacunar infarcts or sequelae of a prior toxic/metabolic insult. 6. Mild chronic small ischemic changes within the cerebral white matter and pons. 7. Tiny chronic infarcts within the left cerebellar hemisphere. 8. Mild generalized parenchymal atrophy. Electronically Signed   By: Kellie Simmering D.O.   On: 06/20/2022 12:37   CT HEAD WO CONTRAST (5MM)  Result Date: 06/20/2022 CLINICAL DATA:  71 year old female with altered mental status, right leg weakness. EXAM: CT HEAD WITHOUT CONTRAST TECHNIQUE: Contiguous axial images were obtained from the base of the skull through the vertex without intravenous contrast. RADIATION DOSE REDUCTION: This exam was performed according to the departmental dose-optimization program which includes automated exposure control, adjustment of the mA and/or kV according to patient size and/or use of iterative reconstruction technique. COMPARISON:  Head CT 03/14/2022, brain MRI 03/09/2022. FINDINGS: Brain: Stable cerebral volume. No midline shift, mass  effect, or evidence of intracranial mass lesion. No ventriculomegaly. No acute intracranial hemorrhage identified. Chronic lacunar infarct at the left genu internal capsule is stable along with mild additional bilateral deep white matter heterogeneity. Preserved gray-white matter differentiation otherwise. No definite cortical encephalomalacia. Vascular: Extensive Calcified atherosclerosis at the skull base. No suspicious intracranial vascular hyperdensity. Skull: No acute osseous abnormality identified. Sinuses/Orbits: Visualized paranasal sinuses and mastoids are stable and well aerated. Other: No acute orbit or scalp soft tissue finding. IMPRESSION: No acute intracranial abnormality. Stable non contrast CT appearance of chronic small vessel disease. Electronically Signed   By: Genevie [name] M.D.   On:  06/20/2022 06:06   DG Chest 2 View  Result Date: 06/19/2022 CLINICAL DATA:  Chest pain EXAM: CHEST - 2 VIEW COMPARISON:  Radiographs 03/08/2022 FINDINGS: No focal consolidation, pleural effusion, or pneumothorax. Normal cardiomediastinal silhouette. No acute osseous abnormality. Chronic displaced fracture of the proximal right humerus. Aortic calcification. Cervical spine fusion hardware. IMPRESSION: No active cardiopulmonary disease. Electronically Signed   By: Placido Sou M.D.   On: 06/19/2022 20:25   CT ABDOMEN PELVIS WO CONTRAST  Result Date: 06/19/2022 CLINICAL DATA:  Abdominal pain. EXAM: CT ABDOMEN AND PELVIS WITHOUT CONTRAST TECHNIQUE: Multidetector CT imaging of the abdomen and pelvis was performed following the standard protocol without IV contrast. RADIATION DOSE REDUCTION: This exam was performed according to the departmental dose-optimization program which includes automated exposure control, adjustment of the mA and/or kV according to patient size and/or use of iterative reconstruction technique. COMPARISON:  CT abdomen pelvis dated 05/26/2022. FINDINGS: Evaluation of this exam is limited in the absence of intravenous contrast. Lower chest: The visualized lung bases are clear. There is coronary vascular calcification and calcification of the mitral annulus. No intra-abdominal free air or free fluid. Hepatobiliary: The liver is unremarkable. No biliary dilatation. Cholecystectomy. No retained calcified stone noted in the central CBD. Pancreas: There is infiltration of the fat surrounding the distal body and tail of the pancreas with loss of fat plane between the pancreas and stomach similar to prior CT. Findings most consistent with scarring and adhesions sequela of chronic pancreatitis. Correlation with pancreatic enzymes recommended to evaluate for possibility of acute on chronic pancreatitis. No drainable fluid collection, abscess, or pseudocyst. Spleen: Normal in size without focal  abnormality. Adrenals/Urinary Tract: The adrenal glands unremarkable. Mild bilateral renal parenchyma atrophy. There is a 2 mm nonobstructing right renal mid pole calculus. No hydronephrosis on either side. Subcentimeter exophytic left renal interpolar hypodense lesion is too small to characterize but appears similar to prior CT, likely a cyst. The visualized ureters and urinary bladder appear unremarkable. Stomach/Bowel: There is moderate stool throughout the colon. There is no bowel obstruction or active inflammation. The appendix is not visualized with certainty. No inflammatory changes identified in the right lower quadrant. Vascular/Lymphatic: Advanced aortoiliac atherosclerotic disease. The IVC is unremarkable. No portal venous gas. There is no adenopathy. Reproductive: Hysterectomy.  No adnexal masses. Other: None Musculoskeletal: There is osteopenia with degenerative changes spine and multilevel posterior fusion hardware. Similar appearance of T12 compression fracture as the prior CT. No new fracture. IMPRESSION: 1. No bowel obstruction. 2. A 2 mm nonobstructing right renal mid pole calculus. No hydronephrosis. 3. Infiltration of the fat surrounding the distal body and tail of the pancreas with loss of fat plane between the pancreas and stomach similar to prior CT. Findings most consistent with scarring and adhesions sequela of chronic pancreatitis. Correlation with pancreatic enzymes recommended to evaluate for possibility of acute on  chronic pancreatitis. No drainable fluid collection, abscess, or pseudocyst. 4. T12 compression fracture similar to prior CT. 5. Aortic Atherosclerosis (ICD10-I70.0). Electronically Signed   By: Anner Crete M.D.   On: 06/19/2022 20:23   CT ABDOMEN PELVIS WO CONTRAST  Result Date: 05/26/2022 CLINICAL DATA:  Left lower quadrant abdominal pain. Nausea and vomiting. EXAM: CT ABDOMEN AND PELVIS WITHOUT CONTRAST TECHNIQUE: Multidetector CT imaging of the abdomen and pelvis  was performed following the standard protocol without IV contrast. RADIATION DOSE REDUCTION: This exam was performed according to the departmental dose-optimization program which includes automated exposure control, adjustment of the mA and/or kV according to patient size and/or use of iterative reconstruction technique. COMPARISON:  CT abdomen pelvis 01/07/2021. FINDINGS: Lower chest: Subtle scattered centrilobular ground-glass opacities in the visualized lower lobes and right middle lobe. No pleural effusion. A 0.5 cm solid nodule in the left lung base is stable compared to 03/02/2020, consistent with benignity (series 4, image 26). Hepatobiliary: No focal liver abnormality is seen. Status post cholecystectomy. No biliary dilatation. Pancreas: Subtle peripancreatic fat stranding is noted along the tail of the pancreas. No peripancreatic fluid collection. The pancreas does not appear enlarged. Spleen: Normal in size. Numerous punctate calcifications may reflect calcified granulomas versus vascular calcifications. Adrenals/Urinary Tract: Adrenal glands are unremarkable. A 0.8 cm exophytic lesion at the posterior aspect of the middle third of the left kidney demonstrates attenuation of -23 Hounsfield units, consistent with a fat containing mass (series 2, image 36). No associated calcifications. The mass is stable compared to CT dated 02/05/2020 and is consistent with a stable angiomyolipoma; no additional dedicated follow-up imaging is indicated. A punctate nonobstructive calyceal stone is noted in the upper pole of the right kidney (series 6, image 55). Urinary bladder is minimally filled and unremarkable given degree of distention. Stomach/Bowel: Stomach is within normal limits. The appendix is not directly visualized, however there are no pericecal inflammatory changes. No evidence of bowel wall thickening, distention, or inflammatory changes. Vascular/Lymphatic: Aortic atherosclerosis. Severe calcific  atherosclerosis at the origins of the renal artery and throughout the splenic artery. No enlarged abdominal or pelvic lymph nodes. Reproductive: Status post hysterectomy. A 1.4 cm benign right ovarian cyst is noted; no additional dedicated follow-up imaging is indicated. Other: No abdominal wall hernia or abnormality. No abdominopelvic ascites. Musculoskeletal: Compression fracture of the T12 vertebral body with approximately 50% vertebral height loss, new compared to chest radiograph 10/18/2021. No suspicious osseous findings. Bilateral pedicle screw and rod fixation spans L1-L4 with intervertebral spacers at L2-L3 and L3-L4. IMPRESSION: 1. Mild fat stranding surrounding the tail of the pancreas, suggestive of acute pancreatitis. No peripancreatic fluid collection. Recommend correlation with lipase levels. 2. Compression fracture of the T12 vertebral body, with approximately 50% vertebral body height loss, of unknown chronicity. This finding is new since prior chest radiograph 10/18/2021. 3. Nonobstructive right nephrolithiasis. 4. Subtle centrilobular ground-glass opacities in the visualized lower lungs may reflect bronchiolitis or hypersensitivity pneumonitis. 5.  Aortic Atherosclerosis (ICD10-I70.0). Electronically Signed   By: Ileana Roup M.D.   On: 05/26/2022 15:07    Microbiology: Results for orders placed or performed during the hospital encounter of 06/19/22  Urine Culture     Status: Abnormal   Collection Time: 06/19/22  9:03 PM   Specimen: Urine, Clean Catch  Result Value Ref Range Status   Specimen Description   Final    URINE, CLEAN CATCH Performed at [facility], [address], [place], [place] [address]    Special Requests  Final    NONE Performed at [facility], [address], [place], [place] [address]    Culture >=100,000 COLONIES/mL ESCHERICHIA COLI (A)  Final   Report Status 06/22/2022 FINAL  Final   Organism ID, Bacteria ESCHERICHIA COLI (A)  Final      Susceptibility    Escherichia coli - MIC*    AMPICILLIN <=2 SENSITIVE Sensitive     CEFAZOLIN <=4 SENSITIVE Sensitive     CEFEPIME <=0.12 SENSITIVE Sensitive     CEFTRIAXONE <=0.25 SENSITIVE Sensitive     CIPROFLOXACIN <=0.25 SENSITIVE Sensitive     GENTAMICIN <=1 SENSITIVE Sensitive     IMIPENEM <=0.25 SENSITIVE Sensitive     NITROFURANTOIN <=16 SENSITIVE Sensitive     TRIMETH/SULFA <=20 SENSITIVE Sensitive     AMPICILLIN/SULBACTAM <=2 SENSITIVE Sensitive     PIP/TAZO <=4 SENSITIVE Sensitive     * >=100,000 COLONIES/mL ESCHERICHIA COLI  MRSA Next Gen by PCR, Nasal     Status: None   Collection Time: 06/20/22 12:56 AM   Specimen: Nasal Mucosa; Nasal Swab  Result Value Ref Range Status   MRSA by PCR Next Gen NOT DETECTED NOT DETECTED Final    Comment: (NOTE) The GeneXpert MRSA Assay (FDA approved for NASAL specimens only), is one component of a comprehensive MRSA colonization surveillance program. It is not intended to diagnose MRSA infection nor to guide or monitor treatment for MRSA infections. Test performance is not FDA approved in patients less than 85 years old. Performed at [facility], [address], [place], [place] [address]     Labs: CBC: Recent Labs  Lab 06/19/22 1432 06/19/22 1454 06/19/22 1859 06/20/22 0301 06/21/22 0607  WBC  --  10.4 6.9 9.7 5.9  NEUTROABS  --  8.6*  --  6.7  --   HGB 10.1* 9.7* 8.7* 9.5* 8.6*  HCT  --  30.2* 26.4* 30.0* 26.3*  MCV  --  97.7 97.4 98.7 97.4  PLT  --  296 230 256 326   Basic Metabolic Panel: Recent Labs  Lab 06/19/22 1455 06/19/22 1859 06/19/22 2143 06/20/22 0301 06/20/22 1025 06/21/22 0607 06/22/22 0416  NA 128* 128*  --  134* 137 136 138  K >7.5* 6.8* 7.1* 5.7* 4.9 5.1 4.8  CL 97* 98  --  103 113* 107 112*  CO2 20* 20*  --  21* 19* 20* 23  GLUCOSE 439* 493*  --  110* 101* 216* 217*  BUN 68* 70*  --  69* 56* 55* 45*  CREATININE 3.59* 3.77*  --  3.57* 2.99* 2.88* 2.13*  CALCIUM 9.3 9.1  --  9.0 7.0* 8.4* 8.0*  MG  --   --    --  1.4*  --  1.9  --   PHOS 5.1*  --   --   --  4.5  --  3.1   Liver Function Tests: Recent Labs  Lab 06/19/22 1455 06/20/22 0301 06/20/22 1025 06/21/22 0607 06/22/22 0416  AST  --  40  --  145* 62*  ALT  --  32  --  128* 77*  ALKPHOS  --  130*  --  182* 164*  BILITOT  --  0.5  --  0.3 0.4  PROT  --  6.7  --  5.8* 5.4*  ALBUMIN 3.2* 2.8* 2.1* 2.4* 2.2*  2.2*   CBG: Recent Labs  Lab 06/21/22 1139 06/21/22 1738 06/21/22 2143 06/22/22 0200 06/22/22 0736  GLUCAP 157* 229* 106* 83 205*    Discharge time spent:  greater than 30 minutes.  Signed: Orson Eva, MD [facility] 06/22/2022

## 2022-06-22 NOTE — Inpatient Diabetes Management (Signed)
Inpatient Diabetes Program Recommendations  AACE/ADA: New Consensus Statement on Inpatient Glycemic Control (2015)  Target Ranges:  Prepandial:   less than 140 mg/dL      Peak postprandial:   less than 180 mg/dL (1-2 hours)      Critically ill patients:  140 - 180 mg/dL   Lab Results  Component Value Date   GLUCAP 205 (H) 06/22/2022   HGBA1C 10.0 (H) 06/20/2022    Review of Glycemic Control  Inpatient Diabetes Program Recommendations:   Spoke with patient via phone (DM coordinator @ [facility] campus). Reviewed current A1c of 10.0 (average blood glucose 240 over the past 2-3 months). Patient states she is taking her insulin as prescribed and checks her CBGs qid. Reminded patient to take log of CBGs or glucose meter to her MD appointments and patient plans to keep track of CBGs.  Thank you, Nani Gasser. Keiva Dina, RN, MSN, CDE  Diabetes Coordinator Inpatient Glycemic Control Team Team Pager [phone] (8am-5pm) 06/22/2022 11:29 AM

## 2022-06-22 NOTE — Care Management Important Message (Signed)
Important Message  Patient Details  Name: Heather [name] MRN: [id number] Date of Birth: 02/02/1951   Medicare Important Message Given:  Yes     Tommy Medal 06/22/2022, 12:12 PM

## 2022-06-22 NOTE — Evaluation (Signed)
Physical Therapy Evaluation Patient Details Name: Heather [name] MRN: [id number] DOB: 1951/01/20 Today's Date: 06/22/2022  History of Present Illness  71 y/o female with a history of diabetes mellitus type 2, hyperlipidemia, Crohn's disease, CKD stage 4, B12 deficiency, anxiety disorder, stroke, HTN, chronic pancreatitis, mixed hyperlipidemia presenting after she was notified by her PCP to go to the ED for abnormal labs.   Clinical Impression  Patient requires min g/a to transition to seated EOB and demonstrates good sitting balance and sitting tolerance. Patient able to transfer to standing and ambulate with HHA for balance but does not have a loss of balance. She ambulates in hall and is limited by fatigue. Patient discharged to care of nursing for ambulation daily as tolerated for length of stay.        Recommendations for follow up therapy are one component of a multi-disciplinary discharge planning process, led by the attending physician.  Recommendations may be updated based on patient status, additional functional criteria and insurance authorization.  Follow Up Recommendations No PT follow up      Assistance Recommended at Discharge PRN  Patient can return home with the following       Equipment Recommendations None recommended by PT  Recommendations for Other Services       Functional Status Assessment Patient has had a recent decline in their functional status and demonstrates the ability to make significant improvements in function in a reasonable and predictable amount of time.     Precautions / Restrictions Precautions Precautions: Fall Restrictions Weight Bearing Restrictions: No      Mobility  Bed Mobility Overal bed mobility: Needs Assistance Bed Mobility: Supine to Sit     Supine to sit: Min guard, Min assist     General bed mobility comments: slight assist to pull to seated    Transfers Overall transfer level: Needs assistance Equipment used: 1  person hand held assist Transfers: Sit to/from Stand Sit to Stand: Min guard           General transfer comment: requires HHA to transfer to standing    Ambulation/Gait Ambulation/Gait assistance: Min guard Gait Distance (Feet): 60 Feet Assistive device: 1 person hand held assist Gait Pattern/deviations: Step-through pattern, Decreased stride length Gait velocity: decreased     General Gait Details: slow, labored, slightly unsteady cadence with HHA, no loss of balance  Stairs            Wheelchair Mobility    Modified Rankin (Stroke Patients Only)       Balance Overall balance assessment: Needs assistance Sitting-balance support: No upper extremity supported, Feet supported Sitting balance-Leahy Scale: Good     Standing balance support: No upper extremity supported Standing balance-Leahy Scale: Fair                               Pertinent Vitals/Pain Pain Assessment Pain Assessment: Faces Faces Pain Scale: Hurts a little bit Pain Location: R shoulder Pain Descriptors / Indicators: Sore Pain Intervention(s): Limited activity within patient's tolerance, Monitored during session, Repositioned    Home Living Family/patient expects to be discharged to:: Private residence Living Arrangements: Spouse/significant other Available Help at Discharge: Family;Available 24 hours/day Type of Home: House Home Access: Ramped entrance       Home Layout: One level Home Equipment: Rollator (4 wheels);Crutches;Tub bench;Grab bars - tub/shower;Rolling Walker (2 wheels);Cane - quad      Prior Function Prior Level of Function : Independent/Modified Independent  Mobility Comments: patient states short distance community ambulation without AD ADLs Comments: states independent with basic ADL     Hand Dominance        Extremity/Trunk Assessment   Upper Extremity Assessment Upper Extremity Assessment: Overall WFL for tasks assessed     Lower Extremity Assessment Lower Extremity Assessment: Overall WFL for tasks assessed    Cervical / Trunk Assessment Cervical / Trunk Assessment: Normal  Communication   Communication: No difficulties  Cognition Arousal/Alertness: Awake/alert Behavior During Therapy: WFL for tasks assessed/performed Overall Cognitive Status: Within Functional Limits for tasks assessed                                          General Comments      Exercises     Assessment/Plan    PT Assessment Patient does not need any further PT services  PT Problem List Decreased strength;Decreased activity tolerance;Decreased balance;Decreased mobility       PT Treatment Interventions      PT Goals (Current goals can be found in the Care Plan section)  Acute Rehab PT Goals Patient Stated Goal: return home PT Goal Formulation: With patient Time For Goal Achievement: 06/22/22 Potential to Achieve Goals: Good    Frequency       Co-evaluation               AM-PAC PT "6 Clicks" Mobility  Outcome Measure Help needed turning from your back to your side while in a flat bed without using bedrails?: None Help needed moving from lying on your back to sitting on the side of a flat bed without using bedrails?: None Help needed moving to and from a bed to a chair (including a wheelchair)?: A Little Help needed standing up from a chair using your arms (e.g., wheelchair or bedside chair)?: A Little Help needed to walk in hospital room?: A Little Help needed climbing 3-5 steps with a railing? : A Little 6 Click Score: 20    End of Session   Activity Tolerance: Patient tolerated treatment well Patient left: in bed;with call bell/phone within reach Nurse Communication: Mobility status PT Visit Diagnosis: Unsteadiness on feet (R26.81);Other abnormalities of gait and mobility (R26.89);Muscle weakness (generalized) (M62.81)    Time: 9675-9163 PT Time Calculation (min) (ACUTE ONLY):  7 min   Charges:   PT Evaluation $PT Eval Low Complexity: 1 Low          11:34 AM, 06/22/22 Mearl Latin PT, DPT Physical Therapist at [facility]

## 2022-06-27 ENCOUNTER — Telehealth: Payer: Self-pay | Admitting: Orthopedic Surgery

## 2022-06-27 DIAGNOSIS — L89322 Pressure ulcer of left buttock, stage 2: Secondary | ICD-10-CM | POA: Diagnosis not present

## 2022-06-27 DIAGNOSIS — I10 Essential (primary) hypertension: Secondary | ICD-10-CM | POA: Diagnosis not present

## 2022-06-27 DIAGNOSIS — Z299 Encounter for prophylactic measures, unspecified: Secondary | ICD-10-CM | POA: Diagnosis not present

## 2022-06-27 DIAGNOSIS — I509 Heart failure, unspecified: Secondary | ICD-10-CM | POA: Diagnosis not present

## 2022-06-27 DIAGNOSIS — N184 Chronic kidney disease, stage 4 (severe): Secondary | ICD-10-CM | POA: Diagnosis not present

## 2022-06-27 NOTE — Telephone Encounter (Signed)
Patient called to inquire about the [facility] office phone number; states that she wanted to find the doctor there who can do surgery on her arm/shoulder. Phone number given; relayed to patient to mention that she has been seen by one of our orthopaedic specialists in the [place] office.

## 2022-07-03 ENCOUNTER — Telehealth: Payer: Self-pay | Admitting: *Deleted

## 2022-07-03 ENCOUNTER — Encounter (HOSPITAL_COMMUNITY)
Admission: RE | Admit: 2022-07-03 | Discharge: 2022-07-03 | Disposition: A | Discharge status: Home / Self Care | Payer: 59 | Source: Ambulatory Visit | Attending: Nephrology | Admitting: Nephrology

## 2022-07-03 DIAGNOSIS — N179 Acute kidney failure, unspecified: Secondary | ICD-10-CM | POA: Insufficient documentation

## 2022-07-03 DIAGNOSIS — N189 Chronic kidney disease, unspecified: Secondary | ICD-10-CM | POA: Insufficient documentation

## 2022-07-03 DIAGNOSIS — N184 Chronic kidney disease, stage 4 (severe): Secondary | ICD-10-CM | POA: Insufficient documentation

## 2022-07-03 NOTE — Telephone Encounter (Signed)
   Pre-operative Risk Assessment    Patient Name: Heather [name]  DOB: [birth year] MRN: [id number]      Request for Surgical Clearance    Procedure:   RIGHT REVERSE SHOULDER ARTHROPLASTY  Date of Surgery:  Clearance TBD                                 Surgeon:  DR. Lennette Bihari SUPPLE Surgeon's Group or Practice Name:  [facility] Phone number:  [phone] Fax number:  [phone] ATTN: [place] Chard   Type of Clearance Requested:   - Medical ; NO MEDICATIONS ARE LISTED AS NEEDING TO BE HELD   Type of Anesthesia:  General    Additional requests/questions:    Jiles Prows   07/03/2022, 3:02 PM

## 2022-07-04 ENCOUNTER — Inpatient Hospital Stay (HOSPITAL_COMMUNITY)
Admission: EM | Admit: 2022-07-04 | Discharge: 2022-07-07 | DRG: 439 | Disposition: A | Discharge status: Home / Self Care | Payer: 59 | Attending: Internal Medicine | Admitting: Internal Medicine

## 2022-07-04 ENCOUNTER — Other Ambulatory Visit: Payer: Self-pay

## 2022-07-04 ENCOUNTER — Emergency Department (HOSPITAL_COMMUNITY): Payer: 59

## 2022-07-04 ENCOUNTER — Encounter (HOSPITAL_COMMUNITY): Payer: Self-pay

## 2022-07-04 DIAGNOSIS — E1165 Type 2 diabetes mellitus with hyperglycemia: Secondary | ICD-10-CM | POA: Diagnosis present

## 2022-07-04 DIAGNOSIS — E871 Hypo-osmolality and hyponatremia: Secondary | ICD-10-CM | POA: Diagnosis present

## 2022-07-04 DIAGNOSIS — Z9049 Acquired absence of other specified parts of digestive tract: Secondary | ICD-10-CM

## 2022-07-04 DIAGNOSIS — K861 Other chronic pancreatitis: Secondary | ICD-10-CM | POA: Diagnosis present

## 2022-07-04 DIAGNOSIS — Z8673 Personal history of transient ischemic attack (TIA), and cerebral infarction without residual deficits: Secondary | ICD-10-CM

## 2022-07-04 DIAGNOSIS — Z87442 Personal history of urinary calculi: Secondary | ICD-10-CM

## 2022-07-04 DIAGNOSIS — K863 Pseudocyst of pancreas: Secondary | ICD-10-CM | POA: Diagnosis present

## 2022-07-04 DIAGNOSIS — H539 Unspecified visual disturbance: Secondary | ICD-10-CM

## 2022-07-04 DIAGNOSIS — G4700 Insomnia, unspecified: Secondary | ICD-10-CM | POA: Diagnosis present

## 2022-07-04 DIAGNOSIS — E1140 Type 2 diabetes mellitus with diabetic neuropathy, unspecified: Secondary | ICD-10-CM | POA: Diagnosis present

## 2022-07-04 DIAGNOSIS — S42211A Unspecified displaced fracture of surgical neck of right humerus, initial encounter for closed fracture: Secondary | ICD-10-CM | POA: Diagnosis present

## 2022-07-04 DIAGNOSIS — Z79899 Other long term (current) drug therapy: Secondary | ICD-10-CM

## 2022-07-04 DIAGNOSIS — K30 Functional dyspepsia: Secondary | ICD-10-CM | POA: Diagnosis present

## 2022-07-04 DIAGNOSIS — E8809 Other disorders of plasma-protein metabolism, not elsewhere classified: Secondary | ICD-10-CM | POA: Diagnosis present

## 2022-07-04 DIAGNOSIS — R296 Repeated falls: Secondary | ICD-10-CM | POA: Diagnosis present

## 2022-07-04 DIAGNOSIS — E1122 Type 2 diabetes mellitus with diabetic chronic kidney disease: Secondary | ICD-10-CM | POA: Diagnosis present

## 2022-07-04 DIAGNOSIS — Z881 Allergy status to other antibiotic agents status: Secondary | ICD-10-CM

## 2022-07-04 DIAGNOSIS — T50995A Adverse effect of other drugs, medicaments and biological substances, initial encounter: Secondary | ICD-10-CM | POA: Diagnosis present

## 2022-07-04 DIAGNOSIS — K219 Gastro-esophageal reflux disease without esophagitis: Secondary | ICD-10-CM | POA: Diagnosis present

## 2022-07-04 DIAGNOSIS — D329 Benign neoplasm of meninges, unspecified: Secondary | ICD-10-CM | POA: Diagnosis present

## 2022-07-04 DIAGNOSIS — Z79620 Long term (current) use of immunosuppressive biologic: Secondary | ICD-10-CM

## 2022-07-04 DIAGNOSIS — Z90710 Acquired absence of both cervix and uterus: Secondary | ICD-10-CM

## 2022-07-04 DIAGNOSIS — K859 Acute pancreatitis without necrosis or infection, unspecified: Secondary | ICD-10-CM

## 2022-07-04 DIAGNOSIS — I2510 Atherosclerotic heart disease of native coronary artery without angina pectoris: Secondary | ICD-10-CM | POA: Diagnosis present

## 2022-07-04 DIAGNOSIS — K853 Drug induced acute pancreatitis without necrosis or infection: Secondary | ICD-10-CM | POA: Diagnosis present

## 2022-07-04 DIAGNOSIS — K760 Fatty (change of) liver, not elsewhere classified: Secondary | ICD-10-CM | POA: Diagnosis present

## 2022-07-04 DIAGNOSIS — Y92009 Unspecified place in unspecified non-institutional (private) residence as the place of occurrence of the external cause: Secondary | ICD-10-CM

## 2022-07-04 DIAGNOSIS — N184 Chronic kidney disease, stage 4 (severe): Secondary | ICD-10-CM | POA: Diagnosis present

## 2022-07-04 DIAGNOSIS — S42291A Other displaced fracture of upper end of right humerus, initial encounter for closed fracture: Secondary | ICD-10-CM | POA: Diagnosis present

## 2022-07-04 DIAGNOSIS — W19XXXA Unspecified fall, initial encounter: Secondary | ICD-10-CM | POA: Diagnosis present

## 2022-07-04 DIAGNOSIS — I5032 Chronic diastolic (congestive) heart failure: Secondary | ICD-10-CM | POA: Diagnosis present

## 2022-07-04 DIAGNOSIS — R112 Nausea with vomiting, unspecified: Secondary | ICD-10-CM

## 2022-07-04 DIAGNOSIS — K5000 Crohn's disease of small intestine without complications: Secondary | ICD-10-CM

## 2022-07-04 DIAGNOSIS — Z800 Family history of malignant neoplasm of digestive organs: Secondary | ICD-10-CM

## 2022-07-04 DIAGNOSIS — D631 Anemia in chronic kidney disease: Secondary | ICD-10-CM | POA: Diagnosis present

## 2022-07-04 DIAGNOSIS — S42293A Other displaced fracture of upper end of unspecified humerus, initial encounter for closed fracture: Secondary | ICD-10-CM

## 2022-07-04 DIAGNOSIS — H532 Diplopia: Secondary | ICD-10-CM | POA: Diagnosis present

## 2022-07-04 DIAGNOSIS — F32A Depression, unspecified: Secondary | ICD-10-CM | POA: Diagnosis present

## 2022-07-04 DIAGNOSIS — S42291S Other displaced fracture of upper end of right humerus, sequela: Secondary | ICD-10-CM | POA: Diagnosis not present

## 2022-07-04 DIAGNOSIS — E782 Mixed hyperlipidemia: Secondary | ICD-10-CM | POA: Diagnosis present

## 2022-07-04 DIAGNOSIS — I130 Hypertensive heart and chronic kidney disease with heart failure and stage 1 through stage 4 chronic kidney disease, or unspecified chronic kidney disease: Secondary | ICD-10-CM | POA: Diagnosis present

## 2022-07-04 DIAGNOSIS — Z888 Allergy status to other drugs, medicaments and biological substances status: Secondary | ICD-10-CM

## 2022-07-04 DIAGNOSIS — Z794 Long term (current) use of insulin: Secondary | ICD-10-CM | POA: Diagnosis not present

## 2022-07-04 DIAGNOSIS — Z833 Family history of diabetes mellitus: Secondary | ICD-10-CM

## 2022-07-04 DIAGNOSIS — Z885 Allergy status to narcotic agent status: Secondary | ICD-10-CM

## 2022-07-04 DIAGNOSIS — F419 Anxiety disorder, unspecified: Secondary | ICD-10-CM | POA: Diagnosis present

## 2022-07-04 DIAGNOSIS — D649 Anemia, unspecified: Secondary | ICD-10-CM

## 2022-07-04 LAB — BASIC METABOLIC PANEL
Anion gap: 11 (ref 5–15)
BUN: 47 mg/dL — ABNORMAL HIGH (ref 8–23)
CO2: 19 mmol/L — ABNORMAL LOW (ref 22–32)
Calcium: 8.7 mg/dL — ABNORMAL LOW (ref 8.9–10.3)
Chloride: 96 mmol/L — ABNORMAL LOW (ref 98–111)
Creatinine, Ser: 2.68 mg/dL — ABNORMAL HIGH (ref 0.44–1.00)
GFR, Estimated: 19 mL/min — ABNORMAL LOW (ref >60)
Glucose, Bld: 518 mg/dL (ref 70–99)
Potassium: 5 mmol/L (ref 3.5–5.1)
Sodium: 126 mmol/L — ABNORMAL LOW (ref 135–145)

## 2022-07-04 LAB — LIPASE, BLOOD: Lipase: 86 U/L — ABNORMAL HIGH (ref 11–51)

## 2022-07-04 LAB — URINALYSIS, ROUTINE W REFLEX MICROSCOPIC
Bacteria, UA: NONE SEEN
Bilirubin Urine: NEGATIVE
Glucose, UA: >=500 mg/dL — AB
Hgb urine dipstick: NEGATIVE
Ketones, ur: NEGATIVE mg/dL
Leukocytes,Ua: NEGATIVE
Nitrite: NEGATIVE
Protein, ur: 100 mg/dL — AB
Specific Gravity, Urine: 1.014 (ref 1.005–1.030)
pH: 6 (ref 5.0–8.0)

## 2022-07-04 LAB — CREATININE, SERUM
Creatinine, Ser: 2.7 mg/dL — ABNORMAL HIGH (ref 0.44–1.00)
GFR, Estimated: 19 mL/min — ABNORMAL LOW (ref >60)

## 2022-07-04 LAB — HEPATIC FUNCTION PANEL
ALT: 16 U/L (ref 0–44)
AST: 13 U/L — ABNORMAL LOW (ref 15–41)
Albumin: 2.8 g/dL — ABNORMAL LOW (ref 3.5–5.0)
Alkaline Phosphatase: 96 U/L (ref 38–126)
Bilirubin, Direct: 0.1 mg/dL (ref 0.0–0.2)
Indirect Bilirubin: 0.5 mg/dL (ref 0.3–0.9)
Total Bilirubin: 0.6 mg/dL (ref 0.3–1.2)
Total Protein: 6.6 g/dL (ref 6.5–8.1)

## 2022-07-04 LAB — CBC
HCT: 23.2 % — ABNORMAL LOW (ref 36.0–46.0)
HCT: 23.8 % — ABNORMAL LOW (ref 36.0–46.0)
Hemoglobin: 7.7 g/dL — ABNORMAL LOW (ref 12.0–15.0)
Hemoglobin: 8 g/dL — ABNORMAL LOW (ref 12.0–15.0)
MCH: 32.1 pg (ref 26.0–34.0)
MCH: 32.4 pg (ref 26.0–34.0)
MCHC: 33.2 g/dL (ref 30.0–36.0)
MCHC: 33.6 g/dL (ref 30.0–36.0)
MCV: 96.7 fL (ref 80.0–100.0)
MCV: 96.4 fL (ref 80.0–100.0)
Platelets: 151 10*3/uL (ref 150–400)
Platelets: 183 10*3/uL (ref 150–400)
RBC: 2.4 MIL/uL — ABNORMAL LOW (ref 3.87–5.11)
RBC: 2.47 MIL/uL — ABNORMAL LOW (ref 3.87–5.11)
RDW: 14.4 % (ref 11.5–15.5)
RDW: 14.6 % (ref 11.5–15.5)
WBC: 9 10*3/uL (ref 4.0–10.5)
WBC: 11 10*3/uL — ABNORMAL HIGH (ref 4.0–10.5)
nRBC: 0 % (ref 0.0–0.2)
nRBC: 0 % (ref 0.0–0.2)

## 2022-07-04 LAB — MAGNESIUM: Magnesium: 1.3 mg/dL — ABNORMAL LOW (ref 1.7–2.4)

## 2022-07-04 LAB — CBG MONITORING, ED: Glucose-Capillary: 379 mg/dL — ABNORMAL HIGH (ref 70–99)

## 2022-07-04 LAB — TROPONIN I (HIGH SENSITIVITY)
Troponin I (High Sensitivity): 12 ng/L (ref <18)
Troponin I (High Sensitivity): 12 ng/L (ref <18)

## 2022-07-04 LAB — POC OCCULT BLOOD, ED: Occult Blood: NEGATIVE

## 2022-07-04 MED ORDER — PANTOPRAZOLE SODIUM 40 MG IV SOLR
40.0000 mg | INTRAVENOUS | Status: DC
Start: 2022-07-04 — End: 2022-07-07
  Administered 2022-07-04 – 2022-07-06 (×3): 40 mg via INTRAVENOUS
  Filled 2022-07-04 (×3): qty 10

## 2022-07-04 MED ORDER — INSULIN ASPART 100 UNIT/ML IJ SOLN
0.0000 [IU] | Freq: Three times a day (TID) | INTRAMUSCULAR | Status: DC
  Administered 2022-07-05 – 2022-07-06 (×5): 2 [IU] via SUBCUTANEOUS
  Administered 2022-07-07: 3 [IU] via SUBCUTANEOUS

## 2022-07-04 MED ORDER — ONDANSETRON HCL 4 MG/2ML IJ SOLN
4.0000 mg | Freq: Once | INTRAMUSCULAR | Status: AC
  Administered 2022-07-04: 4 mg via INTRAVENOUS
  Filled 2022-07-04: qty 2

## 2022-07-04 MED ORDER — MORPHINE SULFATE (PF) 4 MG/ML IV SOLN
4.0000 mg | Freq: Once | INTRAVENOUS | Status: AC
  Administered 2022-07-04: 4 mg via INTRAVENOUS
  Filled 2022-07-04: qty 1

## 2022-07-04 MED ORDER — ONDANSETRON HCL 4 MG PO TABS
4.0000 mg | ORAL_TABLET | Freq: Four times a day (QID) | ORAL | Status: DC | PRN
  Administered 2022-07-06: 4 mg via ORAL
  Filled 2022-07-04: qty 1

## 2022-07-04 MED ORDER — ONDANSETRON HCL 4 MG/2ML IJ SOLN
4.0000 mg | Freq: Four times a day (QID) | INTRAMUSCULAR | Status: DC | PRN
  Administered 2022-07-06: 4 mg via INTRAVENOUS

## 2022-07-04 MED ORDER — MORPHINE SULFATE (PF) 2 MG/ML IV SOLN
2.0000 mg | INTRAVENOUS | Status: DC | PRN
  Administered 2022-07-04 – 2022-07-05 (×4): 2 mg via INTRAVENOUS
  Filled 2022-07-04 (×4): qty 1

## 2022-07-04 MED ORDER — INSULIN ASPART 100 UNIT/ML IJ SOLN
0.0000 [IU] | Freq: Every day | INTRAMUSCULAR | Status: DC
  Administered 2022-07-04: 2 [IU] via SUBCUTANEOUS
  Administered 2022-07-06: 3 [IU] via SUBCUTANEOUS

## 2022-07-04 MED ORDER — GLUCERNA SHAKE PO LIQD
237.0000 mL | Freq: Three times a day (TID) | ORAL | Status: DC
  Administered 2022-07-05 – 2022-07-07 (×5): 237 mL via ORAL

## 2022-07-04 MED ORDER — ONDANSETRON HCL 4 MG/2ML IJ SOLN
4.0000 mg | Freq: Four times a day (QID) | INTRAMUSCULAR | Status: DC | PRN
  Administered 2022-07-06: 4 mg via INTRAVENOUS
  Filled 2022-07-04 (×2): qty 2

## 2022-07-04 MED ORDER — LACTATED RINGERS IV SOLN: INTRAVENOUS | Status: AC

## 2022-07-04 MED ORDER — MAGNESIUM SULFATE 2 GM/50ML IV SOLN
2.0000 g | Freq: Once | INTRAVENOUS | Status: AC
  Administered 2022-07-04: 2 g via INTRAVENOUS
  Filled 2022-07-04: qty 50

## 2022-07-04 MED ORDER — BOOST / RESOURCE BREEZE PO LIQD CUSTOM: 1.0000 | Freq: Three times a day (TID) | ORAL | Status: DC

## 2022-07-04 MED ORDER — INSULIN ASPART 100 UNIT/ML IJ SOLN
6.0000 [IU] | Freq: Once | INTRAMUSCULAR | Status: AC
Start: 2022-07-04 — End: 2022-07-04
  Administered 2022-07-04: 6 [IU] via SUBCUTANEOUS
  Filled 2022-07-04: qty 1

## 2022-07-04 MED ORDER — SODIUM CHLORIDE 0.9 % IV BOLUS
500.0000 mL | Freq: Once | INTRAVENOUS | Status: AC
  Administered 2022-07-04: 500 mL via INTRAVENOUS

## 2022-07-04 MED ORDER — INSULIN DETEMIR 100 UNIT/ML ~~LOC~~ SOLN
10.0000 [IU] | Freq: Every day | SUBCUTANEOUS | Status: DC
  Administered 2022-07-05 – 2022-07-06 (×3): 10 [IU] via SUBCUTANEOUS
  Filled 2022-07-04 (×3): qty 0.1

## 2022-07-04 MED ORDER — HEPARIN SODIUM (PORCINE) 5000 UNIT/ML IJ SOLN
5000.0000 [IU] | Freq: Three times a day (TID) | INTRAMUSCULAR | Status: DC
  Administered 2022-07-04 – 2022-07-07 (×8): 5000 [IU] via SUBCUTANEOUS
  Filled 2022-07-04 (×8): qty 1

## 2022-07-04 NOTE — ED Triage Notes (Addendum)
Pt to er, pt states that she was here about two weeks ago with elevated K, states that she still feels the same as when she left the hospital.  States that she is here today because she is still having back pain/flank pain and double vision.    Pt states that she also had some chest pain and vomiting earlier today

## 2022-07-04 NOTE — H&P (Signed)
History and Physical    Patient: Heather [name] VOH:[id number] DOB: 10/31/1950 DOA: 07/04/2022 DOS: the patient was seen and examined on 07/04/2022 PCP: [name], MD  Patient coming from: Home  Chief Complaint:  Chief Complaint  Patient presents with   Back Pain   HPI: Heather [name] is a 71 y.o. female with medical history significant of hypertension, hyperlipidemia, T2DM, CKD stage IV, Crohn's disease, chronic pancreatitis, anxiety, stroke, IBS with diarrhea who presents to the emergency department due to 3-day onset of left flank pain with radiation of pain to left upper quadrant and epigastric area.  Pain was knifelike, it was constant, though the pain severity range from 10/10 (maximum) to 7-8/10 (minimal).  Abdominal pain was associated with nausea and vomiting.  Movement worsens the pain and pain medication given in the ED alleviates the pain.  She also complained of unwitnessed falls (3 times) since last 3 days due to weakness.  She endorsed double vision which was chronic, but denies chest pain, shortness of breath, fever, chills, headache.  ED Course:  In the emergency department, patient was hemodynamically stable.  Work-up in the ED showed normocytic anemia, BMP showed hyponatremia, potassium 5.0, chloride 96, bicarb 19, glucose 518, BUN 47, creatinine 2.68 (creatinine is within baseline range).  Urinalysis was normal, lipase 86, albumin 2.8, magnesium 1.3, troponin x2 was negative. CT chest, abdomen and pelvis without contrast showed: 1. CT findings consistent with acute pancreatitis. 2. Age advanced atherosclerotic calcification involving the aorta and branch vessels including the coronary arteries. 3. No acute pulmonary findings or worrisome pulmonary lesions. 4. Complex comminuted ununited right humeral head and neck fracture. Insulin was given with hyperglycemia, magnesium was replenished, morphine was given due to abdominal pain, Zofran was given due to nausea and  vomiting and IV hydration was provided.  Hospitalist was asked to admit patient for further evaluation and management.  Review of Systems: Review of systems as noted in the HPI. All other systems reviewed and are negative.   Past Medical History:  Diagnosis Date   Acute renal failure superimposed on stage 3 chronic kidney disease ([place]) 01/21/2016   Anemia    Anxiety    B12 deficiency    Brain tumor (benign) ([place]) 10/29/2020   Cellulitis and abscess    Abdomen and buttocks   Chronic diastolic CHF (congestive heart failure) ([place]) 03/16/2022   CKD (chronic kidney disease) stage 3, GFR 30-59 ml/min (HCC)    Closed fracture of distal end of femur, unspecified fracture morphology, initial encounter ([place])    Coronary arteriosclerosis 07/15/2018   Crohn disease ([place])    Depression    Diabetic neuropathy (HCC)    feet   DJD (degenerative joint disease)    Right forminal stenosis C4-5   Essential hypertension    Femur fracture, right ([place]) 69/48/5462   Folliculitis    GERD (gastroesophageal reflux disease)    Gout    Headache(784.0)    Hiatal hernia    History of blood transfusion    History of bronchitis    History of cardiac catheterization    No significant CAD 2012   History of kidney stones    surgery to remove   Insomnia    Irritable bowel syndrome    Mixed hyperlipidemia    Osteoarthritis    Palpitations    Pneumonia    Reflux esophagitis    Salmonella    Stroke [facility])    Tinnitus    Right   Type 2 diabetes mellitus ([place])  Past Surgical History:  Procedure Laterality Date   BACK SURGERY     c-spine surgery     03/2007   CARDIAC CATHETERIZATION     CHOLECYSTECTOMY     COLONOSCOPY  04/21/2011; 05/29/11   6/12: [place] - ?AVM at IC valve, inflammatory changes at IC valve [facility]); 7/12 - [place]; IC valve erosions, look chronic and probably Crohn's per path   colonoscopy  2017   [place]: random biopsies normal. TI normal    ESOPHAGOGASTRODUODENOSCOPY  05/29/11   normal   ESOPHAGOGASTRODUODENOSCOPY N/A 01/21/2016   Dr. Michail Sermon: normal   EYE SURGERY Bilateral    cataracts removed   FEMUR IM NAIL Right 04/10/2018   Procedure: INTRAMEDULLARY (IM) NAIL FEMORAL;  Surgeon: Paralee Cancel, MD;  Location: [facility];  Service: Orthopedics;  Laterality: Right;   FRACTURE SURGERY     GIVENS CAPSULE STUDY  11/17/2020   HARDWARE REMOVAL Right 04/10/2018   Procedure: HARDWARE REMOVAL RIGHT DISTAL FEMUR;  Surgeon: Paralee Cancel, MD;  Location: [facility];  Service: Orthopedics;  Laterality: Right;   HERNIA REPAIR     IR FLUORO GUIDE CV LINE RIGHT  04/01/2017   IR US GUIDE VASC ACCESS RIGHT  04/01/2017   JOINT REPLACEMENT Right    hip   OPEN REDUCTION INTERNAL FIXATION (ORIF) DISTAL RADIAL FRACTURE Right 05/28/2019   Procedure: OPEN REDUCTION INTERNAL FIXATION (ORIF) DISTAL RADIAL FRACTURE;  Surgeon: Verner Mould, MD;  Location: [place];  Service: Orthopedics;  Laterality: Right;  with block 90 minutes   ORIF FEMUR FRACTURE Right 03/31/2017   Procedure: OPEN REDUCTION INTERNAL FIXATION (ORIF) DISTAL FEMUR FRACTURE;  Surgeon: Paralee Cancel, MD;  Location: [place];  Service: Orthopedics;  Laterality: Right;   removal of kidney stone     Right knee arthroscopic surgery     TONSILLECTOMY     TOTAL ABDOMINAL HYSTERECTOMY      Social History:  reports that she has never smoked. She has never used smokeless tobacco. She reports that she does not drink alcohol and does not use drugs.   Allergies  Allergen Reactions   Cozaar [Losartan] Nausea And Vomiting, Swelling and Rash   Quinolones Itching, Nausea And Vomiting, Nausea Only and Swelling    Swelling of face, jaw, and lips    Cymbalta [Duloxetine Hcl] Itching and Swelling   Sulfa Antibiotics Nausea And Vomiting and Swelling    Headache  Swelling of feet, legs    Synalar [Fluocinolone] Swelling   Cipro [Ciprofloxacin Hcl] Nausea And Vomiting and Swelling    Swelling of face,  jaw, lips   Diovan [Valsartan] Nausea And Vomiting and Swelling   Glucophage [Metformin] Other (See Comments)    GI upset   Ketalar [Ketamine] Swelling   Norco [Hydrocodone-Acetaminophen] Itching and Swelling   Norvasc [Amlodipine Besylate] Itching and Swelling    Fluid retention    Nsaids Nausea And Vomiting and Other (See Comments)    Has bleeding ulcers   Tape Itching and Rash    Paper tape can only be used on this patient   Zestril [Lisinopril] Swelling and Rash    Oral swelling Red streaks on arms/legs    Family History  Problem Relation Age of Onset   Diabetes Mother    Colon cancer Brother        POSSIBLY. Patient states diagnosed at age 89, then later states age 55. unclear and limited historian   Esophageal cancer Neg Hx    Rectal cancer Neg Hx    Stomach cancer  Neg Hx      Prior to Admission medications   Medication Sig Start Date End Date Taking? Authorizing Provider  acetaminophen (TYLENOL) 325 MG tablet Take 2 tablets (650 mg total) by mouth every 4 (four) hours. 12/01/21  Yes Johnson, Clanford L, MD  allopurinol (ZYLOPRIM) 100 MG tablet Take 100 mg by mouth daily. 03/20/21  Yes [provider]  Ascorbic Acid (VITAMIN C WITH ROSE HIPS) 500 MG tablet Take 500 mg by mouth every morning.   Yes [provider]  atorvastatin (LIPITOR) 80 MG tablet Take 1 tablet (80 mg total) by mouth daily. Restart on 06/29/22 06/22/22  Yes Tat, Shanon Brow, MD  busPIRone (BUSPAR) 10 MG tablet Take 10 mg by mouth 3 (three) times daily as needed for anxiety. 03/04/22  Yes [provider]  calcitRIOL (ROCALTROL) 0.25 MCG capsule Take 0.25 mcg by mouth 3 (three) times a week. Monday,Wed and Fri. 10/05/21  Yes [provider]  Cholecalciferol (VITAMIN D) 2000 units CAPS Take 2,000 Units by mouth every morning.    Yes [provider]  cyanocobalamin (,VITAMIN B-12,) 1000 MCG/ML injection Inject 1,000 mcg into the muscle every 30 (thirty) days.     Yes [provider]  dicyclomine (BENTYL) 20 MG tablet Take 1 tablet (20 mg total) by mouth in the morning and at bedtime. 12/01/21  Yes Johnson, Clanford L, MD  diphenhydrAMINE (BENADRYL) 2 % cream Apply topically 3 (three) times daily as needed for itching. 12/01/21  Yes Johnson, Clanford L, MD  esomeprazole (NEXIUM) 40 MG capsule Take 40 mg by mouth daily before breakfast.     Yes [provider]  furosemide (LASIX) 40 MG tablet Take 1 tablet (40 mg total) by mouth daily. 06/22/22  Yes Tat, Shanon Brow, MD  gabapentin (NEURONTIN) 300 MG capsule Take 1 capsule (300 mg total) by mouth at bedtime. 05/27/22  Yes Emokpae, [name], MD  hydrALAZINE (APRESOLINE) 50 MG tablet Take 1 tablet (50 mg total) by mouth 2 (two) times daily. 06/22/22  Yes Tat, Shanon Brow, MD  insulin detemir (LEVEMIR FLEXPEN) 100 UNIT/ML FlexPen Inject 10 Units into the skin at bedtime. 04/25/22  Yes Brita Romp, NP  levETIRAcetam (KEPPRA) 500 MG tablet Take 500 mg by mouth 2 (two) times daily.   Yes [provider]  metoprolol succinate (TOPROL XL) 100 MG 24 hr tablet Take 1 tablet (100 mg total) by mouth daily. Take with or immediately following a meal. 05/27/22 05/27/23 Yes Emokpae, [name], MD  Semaglutide, 1 MG/DOSE, (OZEMPIC, 1 MG/DOSE,) 4 MG/3ML SOPN Inject 1 mg into the skin every 14 (fourteen) days.   Yes [provider]  temazepam (RESTORIL) 15 MG capsule Take 15 mg by mouth at bedtime as needed. 06/10/22  Yes [provider]  tiZANidine (ZANAFLEX) 4 MG tablet Take 4 mg by mouth 2 (two) times daily. 06/14/22  Yes [provider]  valsartan (DIOVAN) 80 MG tablet Take 80 mg by mouth daily. 05/17/22  Yes [provider]  ziprasidone (GEODON) 20 MG capsule Take 20 mg by mouth 2 (two) times daily. 06/09/22  Yes [provider]  cefdinir (OMNICEF) 300 MG capsule Take 1 capsule (300 mg total) by mouth daily. Patient not taking: Reported on 07/04/2022 06/22/22   Orson Eva, MD  insulin aspart  (NOVOLOG) 100 UNIT/ML FlexPen Inject 0-12 Units into the skin 3 (three) times daily with meals. insulin aspart (novoLOG) injection 0-12 Units 0-12 Units Subcutaneous, 3 times daily with meals CBG < 70: Implement Hypoglycemia Standing Orders  and refer to Hypoglycemia Standing Orders sidebar report  CBG 70 - 120: 0 unit CBG 121 - 150: 0 unit  CBG 151 - 200: 2 unit CBG 201 - 250: 4 units CBG 251 - 300: 6 units CBG 301 - 350: 8 units  CBG 351 - 400: 10 units  CBG > 400: 12 units Patient not taking: Reported on 06/20/2022 05/27/22   Roxan Hockey, MD  sodium bicarbonate 650 MG tablet Take 2 tablets (1,300 mg total) by mouth 2 (two) times daily. For kidneys Patient not taking: Reported on 07/04/2022 05/27/22 05/27/23  Roxan Hockey, MD    Physical Exam: BP (!) 156/67 (BP Location: Left Arm)   Pulse 82   Temp 97.7 F (36.5 C) (Oral)   Resp 18   Ht '5\' 3"'$  (1.6 m)   Wt 53.9 kg   LMP  (LMP Unknown)   SpO2 100%   BMI 21.05 kg/m   General: 71 y.o. year-old female well developed well nourished in no acute distress.  Alert and oriented x3. HEENT: NCAT, EOMI, dry mucous membranes Neck: Supple, trachea medial Cardiovascular: Regular rate and rhythm with no rubs or gallops.  No thyromegaly or JVD noted.  No lower extremity edema. 2/4 pulses in all 4 extremities. Respiratory: Clear to auscultation with no wheezes or rales. Good inspiratory effort. Abdomen: Soft, tender to palpation of epigastric and left upper quadrant.  Nondistended with normal bowel sounds x4 quadrants. Muskuloskeletal: No cyanosis, clubbing or edema noted bilaterally Neuro: CN II-XII intact, strength 5/5 x 4, sensation, reflexes intact Skin: No ulcerative lesions noted or rashes Psychiatry: Judgement and insight appear normal. Mood is appropriate for condition and setting          Labs on Admission:  Basic Metabolic Panel: Recent Labs  Lab 07/04/22 1516  NA 126*  K 5.0  CL 96*  CO2 19*  GLUCOSE 518*  BUN 47*  CREATININE  2.68*  CALCIUM 8.7*  MG 1.3*   Liver Function Tests: Recent Labs  Lab 07/04/22 1827  AST 13*  ALT 16  ALKPHOS 96  BILITOT 0.6  PROT 6.6  ALBUMIN 2.8*   Recent Labs  Lab 07/04/22 1827  LIPASE 86*   No results for input(s): "AMMONIA" in the last 168 hours. CBC: Recent Labs  Lab 07/04/22 1516  WBC 9.0  HGB 7.7*  HCT 23.2*  MCV 96.7  PLT 151   Cardiac Enzymes: No results for input(s): "CKTOTAL", "CKMB", "CKMBINDEX", "TROPONINI" in the last 168 hours.  BNP (last 3 results) Recent Labs    08/19/21 0728  BNP 430.0*    ProBNP (last 3 results) No results for input(s): "PROBNP" in the last 8760 hours.  CBG: Recent Labs  Lab 07/04/22 1907  GLUCAP 379*    Radiological Exams on Admission: CT CHEST ABDOMEN PELVIS WO CONTRAST  Result Date: 07/04/2022 CLINICAL DATA:  Shortness of breath, chest pain and vomiting today. EXAM: CT CHEST, ABDOMEN AND PELVIS WITHOUT CONTRAST TECHNIQUE: Multidetector CT imaging of the chest, abdomen and pelvis was performed following the standard protocol without IV contrast. RADIATION DOSE REDUCTION: This exam was performed according to the departmental dose-optimization program which includes automated exposure control, adjustment of the mA and/or kV according to patient size and/or use of iterative reconstruction technique. COMPARISON:  Abdominal CT scan 06/19/2022 FINDINGS: CT CHEST FINDINGS Cardiovascular: The heart is normal in size. No pericardial effusion. The aorta is normal in caliber. Age advanced atherosclerotic calcification involving the aorta and coronary arteries. Mediastinum/Nodes: No mediastinal or hilar mass  or adenopathy. The esophagus is grossly normal. Lungs/Pleura: No infiltrates, edema or effusions. No worrisome pulmonary lesions or pulmonary nodules. No interstitial lung disease or bronchiectasis. Musculoskeletal: There is a complex comminuted fragmented ununited right humeral head and neck fracture remote T12 compression  fracture. CT ABDOMEN PELVIS FINDINGS Hepatobiliary: No obvious hepatic lesions or intrahepatic biliary dilatation. The gallbladder is surgically absent. No common bile duct dilatation. Pancreas: There is marked inflammation of and around the body and tail region of the pancreas suggesting acute pancreatitis. The head is less affected. Spleen: The spleen is upper limits of normal in size and stable. Extensive calcification of the splenic artery noted. Adrenals/Urinary Tract: The adrenal glands and kidneys are grossly normal without contrast. A small right renal calculus is noted. The bladder is unremarkable. Stomach/Bowel: Stomach, duodenum, small bowel and colon are grossly normal. Moderate diffuse colonic diverticulosis without findings for acute diverticulitis. No findings for small bowel obstruction and free air. Vascular/Lymphatic: Advanced atherosclerotic calcification involving the aorta and branch vessels but no aneurysm. No mesenteric or retroperitoneal mass or adenopathy. Reproductive: Surgically absent. Other: No pelvic mass or adenopathy. No free pelvic fluid collections. No inguinal mass or adenopathy. No abdominal wall hernia or subcutaneous lesions. Musculoskeletal: Extensive lumbar fusion hardware. Prior right hip fracture fixation and evidence of prior pelvic fractures. No acute bony findings. IMPRESSION: 1. CT findings consistent with acute pancreatitis. 2. Age advanced atherosclerotic calcification involving the aorta and branch vessels including the coronary arteries. 3. No acute pulmonary findings or worrisome pulmonary lesions. 4. Complex comminuted ununited right humeral head and neck fracture. Aortic Atherosclerosis (ICD10-I70.0). Electronically Signed   By: Marijo Sanes M.D.   On: 07/04/2022 17:27   DG Chest 2 View  Result Date: 07/04/2022 CLINICAL DATA:  Left-sided chest pain EXAM: CHEST - 2 VIEW COMPARISON:  06/19/2022 FINDINGS: The heart size and mediastinal contours are within normal  limits. Both lungs are clear. Unchanged fracture of the proximal right humerus and high-grade wedge deformity of T12. IMPRESSION: No acute abnormality of the lungs. Electronically Signed   By: Delanna Ahmadi M.D.   On: 07/04/2022 13:59    EKG: I independently viewed the EKG done and my findings are as followed: Normal sinus rhythm at rate of 93 bpm  Assessment/Plan Present on Admission:  Acute on chronic pancreatitis ([place])  Chronic kidney disease (CKD), stage IV (severe) (HCC)  GERD (gastroesophageal reflux disease)  Hypomagnesemia  Hyponatremia  Principal Problem:   Acute on chronic pancreatitis (HCC) Active Problems:   Chronic kidney disease (CKD), stage IV (severe) (HCC)   Nausea & vomiting   GERD (gastroesophageal reflux disease)   Hypomagnesemia   Visual disturbance   Hyponatremia   Multiple falls   Humeral head fracture  Acute on chronic pancreatitis Nausea and vomiting Continue IV Zofran p.r.n Continue IV morphine p.r.n for pain Continue Protonix Continue IV LR at 111m/Hr Continue full liquid diet with plan to advance diet as tolerated RUQ U/S in the morning to investigate biliary etiology (gallstone and bile duct dilatation  Falls at home Continue fall precaution and neurochecks Continue PT/OT eval and treat  Right humeral head and neck fracture Patient follows with an orthopedic surgeon and has an impending surgical procedure pending better control of blood glucose level  Hypomagnesemia Mg level is 1.3 This will be replenished Please continue to monitor Mg level and correct accordingly  Hyponatremia possibly due to dehydration Na 126; continue IV hydration Continue to monitor sodium level with morning labs  Hypoalbuminemia possibly secondary to moderate  protein calorie malnutrition Albumin 2.8, protein supplement to be provided  CKD stage IV Creatinine 2.68 (creatinine is within baseline range). Renally adjust medications, avoid nephrotoxic  agents/dehydration/hypotension   T2DM with uncontrolled hyperglycemia Continue Levemir 10 units nightly Continue ISS and hypoglycemia protocol  Essential hypertension Continue hydralazine and metoprolol  Mixed hyperlipidemia Continue Lipitor  GERD Continue Protonix  IBS Continue Bentyl  Visual disturbance MRI done on 8/23 showed 6 mm meningioma overlying the anterior right frontal lobe  Crohn's disease Stable  DVT prophylaxis: Heparin subcu  Code Status: Full code  Consults: None  Family Communication: None at bedside  Severity of Illness: The appropriate patient status for this patient is INPATIENT. Inpatient status is judged to be reasonable and necessary in order to provide the required intensity of service to ensure the patient's safety. The patient's presenting symptoms, physical exam findings, and initial radiographic and laboratory data in the context of their chronic comorbidities is felt to place them at high risk for further clinical deterioration. Furthermore, it is not anticipated that the patient will be medically stable for discharge from the hospital within 2 midnights of admission.   * I certify that at the point of admission it is my clinical judgment that the patient will require inpatient hospital care spanning beyond 2 midnights from the point of admission due to high intensity of service, high risk for further deterioration and high frequency of surveillance required.*  Author: Bernadette Hoit, DO 07/04/2022 9:50 PM  For on call review [email or web address].

## 2022-07-04 NOTE — ED Notes (Signed)
Pt is a hard stick 

## 2022-07-04 NOTE — ED Provider Notes (Signed)
[facility] EMERGENCY DEPARTMENT Provider Note   CSN: [id number] Arrival date & time: 07/04/22  1315     History  Chief Complaint  Patient presents with   Back Pain    Heather [name] is a 71 y.o. female.  71 year old female presents today with multiple complaints.  Patient has history of CHF, hypertension, hyperlipidemia, CVA, CKD.  Patient was recently admitted for AKI, and hyperkalemia.  During that admission she did complain of diplopia, dizziness, abdominal pain, and chest pain.  Today she complains of similar complaints.  She states the abdominal pain/left flank pain has gotten worse since then.  Chest pain has been intermittent since then.  Diplopia, dizziness has been unchanged.  The left flank pain is worse with taking a deep breath.  She denies fever.  Endorses nausea and vomiting earlier today.  The history is provided by the patient. No language interpreter was used.       Home Medications Prior to Admission medications   Medication Sig Start Date End Date Taking? Authorizing Provider  acetaminophen (TYLENOL) 325 MG tablet Take 2 tablets (650 mg total) by mouth every 4 (four) hours. 12/01/21   Johnson, Clanford L, MD  allopurinol (ZYLOPRIM) 100 MG tablet Take 100 mg by mouth daily. 03/20/21   [provider]  Ascorbic Acid (VITAMIN C WITH ROSE HIPS) 500 MG tablet Take 500 mg by mouth every morning.    [provider]  atorvastatin (LIPITOR) 80 MG tablet Take 1 tablet (80 mg total) by mouth daily. Restart on 06/29/22 06/22/22   Orson Eva, MD  busPIRone (BUSPAR) 10 MG tablet Take 10 mg by mouth 3 (three) times daily as needed for anxiety. 03/04/22   [provider]  calcitRIOL (ROCALTROL) 0.25 MCG capsule Take 0.25 mcg by mouth 3 (three) times a week. Monday,Wed and Fri. 10/05/21   [provider]  calcium carbonate (OS-CAL) 1250 (500 Ca) MG chewable tablet Chew 1 tablet by mouth 2 (two) times daily. 10/04/21 10/04/22  [provider]   cefdinir (OMNICEF) 300 MG capsule Take 1 capsule (300 mg total) by mouth daily. 06/22/22   Orson Eva, MD  Cholecalciferol (VITAMIN D) 2000 units CAPS Take 2,000 Units by mouth every morning.     [provider]  cyanocobalamin (,VITAMIN B-12,) 1000 MCG/ML injection Inject 1,000 mcg into the muscle every 30 (thirty) days.      [provider]  diazepam (VALIUM) 5 MG tablet Take 1 tablet (5 mg total) by mouth every 12 (twelve) hours as needed for anxiety. 12/01/21   Johnson, Clanford L, MD  dicyclomine (BENTYL) 20 MG tablet Take 1 tablet (20 mg total) by mouth in the morning and at bedtime. 12/01/21   Johnson, Clanford L, MD  diphenhydrAMINE (BENADRYL) 2 % cream Apply topically 3 (three) times daily as needed for itching. 12/01/21   Johnson, Clanford L, MD  esomeprazole (NEXIUM) 40 MG capsule Take 40 mg by mouth daily before breakfast.      [provider]  furosemide (LASIX) 40 MG tablet Take 1 tablet (40 mg total) by mouth daily. 06/22/22   Orson Eva, MD  gabapentin (NEURONTIN) 300 MG capsule Take 1 capsule (300 mg total) by mouth at bedtime. 05/27/22   Roxan Hockey, MD  hydrALAZINE (APRESOLINE) 50 MG tablet Take 1 tablet (50 mg total) by mouth 2 (two) times daily. 06/22/22   Orson Eva, MD  insulin aspart (NOVOLOG) 100 UNIT/ML FlexPen Inject 0-12 Units into the skin 3 (three) times daily with meals.  insulin aspart (novoLOG) injection 0-12 Units 0-12 Units Subcutaneous, 3 times daily with meals CBG < 70: Implement Hypoglycemia Standing Orders and refer to Hypoglycemia Standing Orders sidebar report  CBG 70 - 120: 0 unit CBG 121 - 150: 0 unit  CBG 151 - 200: 2 unit CBG 201 - 250: 4 units CBG 251 - 300: 6 units CBG 301 - 350: 8 units  CBG 351 - 400: 10 units  CBG > 400: 12 units Patient not taking: Reported on 06/20/2022 05/27/22   Roxan Hockey, MD  insulin detemir (LEVEMIR FLEXPEN) 100 UNIT/ML FlexPen Inject 10 Units into the skin at bedtime. 04/25/22   Brita Romp, NP   levETIRAcetam (KEPPRA) 500 MG tablet Take 500 mg by mouth 2 (two) times daily.    [provider]  metoprolol succinate (TOPROL XL) 100 MG 24 hr tablet Take 1 tablet (100 mg total) by mouth daily. Take with or immediately following a meal. 05/27/22 05/27/23  Emokpae, [name], MD  polyethylene glycol (MIRALAX / GLYCOLAX) 17 g packet Take 17 g by mouth daily. Patient taking differently: Take 17 g by mouth daily as needed for moderate constipation. 12/02/21   Johnson, Clanford L, MD  sodium bicarbonate 650 MG tablet Take 2 tablets (1,300 mg total) by mouth 2 (two) times daily. For kidneys 05/27/22 05/27/23  Roxan Hockey, MD      Allergies    Cozaar [losartan], Quinolones, Cymbalta [duloxetine hcl], Sulfa antibiotics, Synalar [fluocinolone], Cipro [ciprofloxacin hcl], Diovan [valsartan], Glucophage [metformin], Ketalar [ketamine], Norco [hydrocodone-acetaminophen], Norvasc [amlodipine besylate], Nsaids, Tape, and Zestril [lisinopril]    Review of Systems   Review of Systems  Constitutional:  Negative for chills and fever.  Respiratory:  Negative for shortness of breath.   Cardiovascular:  Positive for chest pain (now resolved). Negative for palpitations and leg swelling.  Gastrointestinal:  Positive for abdominal pain, nausea and vomiting.  Genitourinary:  Positive for flank pain. Negative for dysuria.  Neurological:  Negative for syncope, weakness and light-headedness.  All other systems reviewed and are negative.   Physical Exam Updated Vital Signs BP (!) 141/72 (BP Location: Left Arm)   Pulse 89   Temp 97.7 F (36.5 C) (Oral)   Resp 16   Ht '5\' 4"'$  (1.626 m)   Wt 53.1 kg   LMP  (LMP Unknown)   SpO2 100%   BMI 20.08 kg/m  Physical Exam Vitals and nursing note reviewed.  Constitutional:      General: She is not in acute distress.    Appearance: Normal appearance. She is not ill-appearing.  HENT:     Head: Normocephalic and atraumatic.     Nose: Nose normal.  Eyes:      General: No scleral icterus.    Extraocular Movements: Extraocular movements intact.     Conjunctiva/sclera: Conjunctivae normal.  Cardiovascular:     Rate and Rhythm: Normal rate and regular rhythm.     Pulses: Normal pulses.  Pulmonary:     Effort: Pulmonary effort is normal. No respiratory distress.     Breath sounds: Normal breath sounds. No wheezing or rales.  Abdominal:     General: There is no distension.     Palpations: Abdomen is soft.     Tenderness: There is abdominal tenderness. There is left CVA tenderness. There is no right CVA tenderness or guarding.  Musculoskeletal:        General: Normal range of motion.     Cervical back: Normal range of motion.  Skin:    General:  Skin is warm and dry.  Neurological:     General: No focal deficit present.     Mental Status: She is alert. Mental status is at baseline.     ED Results / Procedures / Treatments   Labs (all labs ordered are listed, but only abnormal results are displayed) Labs Reviewed  BASIC METABOLIC PANEL  CBC  MAGNESIUM  URINALYSIS, ROUTINE W REFLEX MICROSCOPIC  TROPONIN I (HIGH SENSITIVITY)  TROPONIN I (HIGH SENSITIVITY)    EKG EKG Interpretation  Date/Time:  Wednesday July 04 2022 13:32:37 EDT Ventricular Rate:  93 PR Interval:  156 QRS Duration: 78 QT Interval:  358 QTC Calculation: 445 R Axis:   7 Text Interpretation: Normal sinus rhythm Left ventricular hypertrophy with repolarization abnormality ( R in aVL , Cornell product , Romhilt-Estes ) Confirmed by Godfrey Pick [phone]) on 07/04/2022 2:21:41 PM  Radiology DG Chest 2 View  Result Date: 07/04/2022 CLINICAL DATA:  Left-sided chest pain EXAM: CHEST - 2 VIEW COMPARISON:  06/19/2022 FINDINGS: The heart size and mediastinal contours are within normal limits. Both lungs are clear. Unchanged fracture of the proximal right humerus and high-grade wedge deformity of T12. IMPRESSION: No acute abnormality of the lungs. Electronically Signed   By: Delanna Ahmadi M.D.   On: 07/04/2022 13:59    Procedures Procedures    Medications Ordered in ED Medications - No data to display  ED Course/ Medical Decision Making/ A&P Clinical Course as of 07/04/22 1949  Wed Jul 04, 2022  1752 CBC shows no leukocytosis.  Hemoglobin of 7.7 which upon chart review has been downtrending over the past month.  1 month ago hemoglobin was between 10 and 11.  BMP shows glucose of 518, sodium of 126.  If sodium is corrected for hyperglycemia sodium is about 133.  Creatinine at 2.68 which is around patient's baseline.  Magnesium of 1.3.  Initial troponin of 12.  UA pending. CT chest abdomen pelvis without contrast demonstrates no acute lung findings suggest pneumonia.  CT does demonstrate a complex comminuted fragmented ununited right humeral head and neck fracture.  Remote T12 compression fracture noted.  CT abdomen shows findings consistent with acute pancreatitis. We will add lipase, hepatic function panel.  She denies significant history of alcohol consumption. [AA]  1800 Patient states the right humeral fracture is chronic and happened about 2 months ago following a fall.  She states her orthopedist is aware of this and is planning to repair when her blood sugars are under better control. [AA]    Clinical Course User Index [AA] Evlyn Courier, PA-C                           Medical Decision Making Amount and/or Complexity of Data Reviewed Labs: ordered. Radiology: ordered.  Risk Prescription drug management. Decision regarding hospitalization.   Medical Decision Making / ED Course   This patient presents to the ED for concern of abdominal pain, this involves an extensive number of treatment options, and is a complaint that carries with it a high risk of complications and morbidity.  The differential diagnosis includes pancreatitis, cholecystitis, appendicitis, SBO  MDM: 71 year old female presents with multiple complaints primarily abdominal pain.  Present  since discharge however progressively worsening in the couple days.  She also has a few other complaints as noted in the HPI.  Doubt diplopia and dizziness is because of CVA as these have been consistent since her recent admission where she had MRI  done without evidence of CVA.  Lipase elevated, CT abdomen pelvis with evidence of pancreatitis.  Pain medication provided.  Small fluid bolus given given history of CHF.  CBC also with evidence of anemia that is downtrending.  Guaiac negative.  CT chest without evidence of pneumonia or other intrathoracic concern.  Without shortness of breath or hypoxia.  Low suspicion for PE.  Patient discussed with hospitalist will evaluate patient for admission for acute pancreatitis.   Additional history obtained: -Additional history obtained from recent admission confirming patient had renal insufficiency, hyperkalemia.  Patient also had MRI done without evidence of acute CVA.  She also had a lumbar MRI done without acute findings. -External records from outside source obtained and reviewed including: Chart review including previous notes, labs, imaging, consultation notes   Lab Tests: -I ordered, reviewed, and interpreted labs.   The pertinent results include:   Labs Reviewed  BASIC METABOLIC PANEL - Abnormal; Notable for the following components:      Result Value   Sodium 126 (*)    Chloride 96 (*)    CO2 19 (*)    Glucose, Bld 518 (*)    BUN 47 (*)    Creatinine, Ser 2.68 (*)    Calcium 8.7 (*)    GFR, Estimated 19 (*)    All other components within normal limits  CBC - Abnormal; Notable for the following components:   RBC 2.40 (*)    Hemoglobin 7.7 (*)    HCT 23.2 (*)    All other components within normal limits  MAGNESIUM - Abnormal; Notable for the following components:   Magnesium 1.3 (*)    All other components within normal limits  URINALYSIS, ROUTINE W REFLEX MICROSCOPIC - Abnormal; Notable for the following components:   Glucose, UA >=500  (*)    Protein, ur 100 (*)    All other components within normal limits  LIPASE, BLOOD - Abnormal; Notable for the following components:   Lipase 86 (*)    All other components within normal limits  HEPATIC FUNCTION PANEL - Abnormal; Notable for the following components:   Albumin 2.8 (*)    AST 13 (*)    All other components within normal limits  CBG MONITORING, ED - Abnormal; Notable for the following components:   Glucose-Capillary 379 (*)    All other components within normal limits  POC OCCULT BLOOD, ED  TROPONIN I (HIGH SENSITIVITY)  TROPONIN I (HIGH SENSITIVITY)      EKG  EKG Interpretation  Date/Time:  Wednesday July 04 2022 13:32:37 EDT Ventricular Rate:  93 PR Interval:  156 QRS Duration: 78 QT Interval:  358 QTC Calculation: 445 R Axis:   7 Text Interpretation: Normal sinus rhythm Left ventricular hypertrophy with repolarization abnormality ( R in aVL , Cornell product , Romhilt-Estes ) Confirmed by Godfrey Pick ([id number]) on 07/04/2022 2:21:41 PM         Imaging Studies ordered: I ordered imaging studies including CT chest abdomen pelvis without contrast I independently visualized and interpreted imaging. I agree with the radiologist interpretation   Medicines ordered and prescription drug management: Meds ordered this encounter  Medications   insulin aspart (novoLOG) injection 6 Units   magnesium sulfate IVPB 2 g 50 mL   morphine (PF) 4 MG/ML injection 4 mg   ondansetron (ZOFRAN) injection 4 mg   sodium chloride 0.9 % bolus 500 mL    -I have reviewed the patients home medicines and have made adjustments as needed  Reevaluation: After  the interventions noted above, I reevaluated the patient and found that they have :improved  Co morbidities that complicate the patient evaluation  Past Medical History:  Diagnosis Date   Acute renal failure superimposed on stage 3 chronic kidney disease ([place]) 01/21/2016   Anemia    Anxiety    B12 deficiency     Brain tumor (benign) ([place]) 10/29/2020   Cellulitis and abscess    Abdomen and buttocks   Chronic diastolic CHF (congestive heart failure) ([place]) 03/16/2022   CKD (chronic kidney disease) stage 3, GFR 30-59 ml/min ([facility])    Closed fracture of distal end of femur, unspecified fracture morphology, initial encounter ([place])    Coronary arteriosclerosis 07/15/2018   Crohn disease ([place])    Depression    Diabetic neuropathy ([facility])    feet   DJD (degenerative joint disease)    Right forminal stenosis C4-5   Essential hypertension    Femur fracture, right ([place]) 46/50/3546   Folliculitis    GERD (gastroesophageal reflux disease)    Gout    Headache(784.0)    Hiatal hernia    History of blood transfusion    History of bronchitis    History of cardiac catheterization    No significant CAD 2012   History of kidney stones    surgery to remove   Insomnia    Irritable bowel syndrome    Mixed hyperlipidemia    Osteoarthritis    Palpitations    Pneumonia    Reflux esophagitis    Salmonella    Stroke [facility])    Tinnitus    Right   Type 2 diabetes mellitus ([place])       Dispostion: Patient discussed with hospitalist who will evaluate patient for admission.   Final Clinical Impression(s) / ED Diagnoses Final diagnoses:  Acute pancreatitis, unspecified complication status, unspecified pancreatitis type  Anemia, unspecified type    Rx / DC Orders ED Discharge Orders     None         Evlyn Courier, PA-C 07/04/22 2011    Godfrey Pick, MD 07/10/22 [phone]

## 2022-07-04 NOTE — Telephone Encounter (Signed)
Primary Cardiologist:None  Chart reviewed as part of pre-operative protocol coverage. Because of Alinah Sheard Sees's past medical history and time since last visit, he/she will require a follow-up visit in order to better assess preoperative cardiovascular risk.  Pre-op covering staff: - Please schedule appointment and call patient to inform them. - Please contact requesting surgeon's office via preferred method (i.e, phone, fax) to inform them of need for appointment prior to surgery.  No request to hold medications.  Emmaline Life, NP-C     07/04/2022, 8:51 AM [address] Office [phone] Fax [phone]

## 2022-07-05 ENCOUNTER — Inpatient Hospital Stay (HOSPITAL_COMMUNITY): Payer: 59

## 2022-07-05 DIAGNOSIS — K861 Other chronic pancreatitis: Secondary | ICD-10-CM | POA: Diagnosis not present

## 2022-07-05 DIAGNOSIS — K5000 Crohn's disease of small intestine without complications: Secondary | ICD-10-CM

## 2022-07-05 DIAGNOSIS — K859 Acute pancreatitis without necrosis or infection, unspecified: Secondary | ICD-10-CM | POA: Diagnosis not present

## 2022-07-05 LAB — COMPREHENSIVE METABOLIC PANEL
ALT: 15 U/L (ref 0–44)
AST: 15 U/L (ref 15–41)
Albumin: 2.8 g/dL — ABNORMAL LOW (ref 3.5–5.0)
Alkaline Phosphatase: 93 U/L (ref 38–126)
Anion gap: 7 (ref 5–15)
BUN: 46 mg/dL — ABNORMAL HIGH (ref 8–23)
CO2: 21 mmol/L — ABNORMAL LOW (ref 22–32)
Calcium: 9 mg/dL (ref 8.9–10.3)
Chloride: 103 mmol/L (ref 98–111)
Creatinine, Ser: 2.59 mg/dL — ABNORMAL HIGH (ref 0.44–1.00)
GFR, Estimated: 19 mL/min — ABNORMAL LOW (ref >60)
Glucose, Bld: 131 mg/dL — ABNORMAL HIGH (ref 70–99)
Potassium: 4.4 mmol/L (ref 3.5–5.1)
Sodium: 131 mmol/L — ABNORMAL LOW (ref 135–145)
Total Bilirubin: 0.7 mg/dL (ref 0.3–1.2)
Total Protein: 6.5 g/dL (ref 6.5–8.1)

## 2022-07-05 LAB — CBC
HCT: 24.3 % — ABNORMAL LOW (ref 36.0–46.0)
Hemoglobin: 8 g/dL — ABNORMAL LOW (ref 12.0–15.0)
MCH: 31.6 pg (ref 26.0–34.0)
MCHC: 32.9 g/dL (ref 30.0–36.0)
MCV: 96 fL (ref 80.0–100.0)
Platelets: 155 10*3/uL (ref 150–400)
RBC: 2.53 MIL/uL — ABNORMAL LOW (ref 3.87–5.11)
RDW: 14.5 % (ref 11.5–15.5)
WBC: 9.5 10*3/uL (ref 4.0–10.5)
nRBC: 0 % (ref 0.0–0.2)

## 2022-07-05 LAB — GLUCOSE, CAPILLARY
Glucose-Capillary: 224 mg/dL — ABNORMAL HIGH (ref 70–99)
Glucose-Capillary: 119 mg/dL — ABNORMAL HIGH (ref 70–99)
Glucose-Capillary: 180 mg/dL — ABNORMAL HIGH (ref 70–99)
Glucose-Capillary: 183 mg/dL — ABNORMAL HIGH (ref 70–99)
Glucose-Capillary: 177 mg/dL — ABNORMAL HIGH (ref 70–99)
Glucose-Capillary: 177 mg/dL — ABNORMAL HIGH (ref 70–99)

## 2022-07-05 LAB — PHOSPHORUS: Phosphorus: 4.1 mg/dL (ref 2.5–4.6)

## 2022-07-05 LAB — MAGNESIUM: Magnesium: 1.9 mg/dL (ref 1.7–2.4)

## 2022-07-05 LAB — APTT: aPTT: 38 seconds — ABNORMAL HIGH (ref 24–36)

## 2022-07-05 MED ORDER — HYDRALAZINE HCL 25 MG PO TABS
50.0000 mg | ORAL_TABLET | Freq: Two times a day (BID) | ORAL | Status: DC
  Administered 2022-07-05 – 2022-07-07 (×3): 50 mg via ORAL
  Filled 2022-07-05 (×5): qty 2

## 2022-07-05 MED ORDER — METOPROLOL SUCCINATE ER 50 MG PO TB24
100.0000 mg | ORAL_TABLET | Freq: Every day | ORAL | Status: DC
  Administered 2022-07-06 – 2022-07-07 (×2): 100 mg via ORAL
  Filled 2022-07-05 (×3): qty 2

## 2022-07-05 MED ORDER — DICYCLOMINE HCL 10 MG PO CAPS
20.0000 mg | ORAL_CAPSULE | Freq: Two times a day (BID) | ORAL | Status: DC
  Administered 2022-07-05 – 2022-07-07 (×5): 20 mg via ORAL
  Filled 2022-07-05 (×5): qty 2

## 2022-07-05 MED ORDER — DICYCLOMINE HCL 20 MG PO TABS
20.0000 mg | ORAL_TABLET | Freq: Two times a day (BID) | ORAL | Status: DC
  Filled 2022-07-05 (×4): qty 1

## 2022-07-05 MED ORDER — DICYCLOMINE HCL 10 MG PO CAPS
20.0000 mg | ORAL_CAPSULE | Freq: Once | ORAL | Status: AC
  Administered 2022-07-05: 20 mg via ORAL

## 2022-07-05 MED ORDER — LACTATED RINGERS IV SOLN: INTRAVENOUS | Status: DC

## 2022-07-05 MED ORDER — SODIUM CHLORIDE 0.9 % IV SOLN: INTRAVENOUS | Status: DC

## 2022-07-05 MED ORDER — ACETAMINOPHEN 325 MG PO TABS
650.0000 mg | ORAL_TABLET | Freq: Four times a day (QID) | ORAL | Status: DC | PRN
  Administered 2022-07-05 – 2022-07-06 (×2): 650 mg via ORAL
  Filled 2022-07-05 (×2): qty 2

## 2022-07-05 MED ORDER — DIPHENHYDRAMINE HCL 25 MG PO CAPS
25.0000 mg | ORAL_CAPSULE | Freq: Once | ORAL | Status: AC
  Administered 2022-07-05: 25 mg via ORAL
  Filled 2022-07-05: qty 1

## 2022-07-05 MED ORDER — ATORVASTATIN CALCIUM 40 MG PO TABS
80.0000 mg | ORAL_TABLET | Freq: Every day | ORAL | Status: DC
  Administered 2022-07-05 – 2022-07-07 (×3): 80 mg via ORAL
  Filled 2022-07-05 (×3): qty 2

## 2022-07-05 MED ORDER — HYDROMORPHONE HCL 1 MG/ML IJ SOLN
0.5000 mg | INTRAMUSCULAR | Status: DC | PRN
  Administered 2022-07-05 – 2022-07-07 (×10): 0.5 mg via INTRAVENOUS
  Filled 2022-07-05 (×10): qty 0.5

## 2022-07-05 NOTE — Consult Note (Signed)
Gastroenterology Consult   Referring Provider: No ref. provider found Primary Care Physician:  [name], MD Primary Gastroenterologist:  Dr. Carlean Purl [facility])  Patient ID: Heather [name]; [id number]; Jul 11, 1951   Admit date: 07/04/2022  LOS: 1 day   Date of Consultation: 07/05/2022  Reason for Consultation:  recurrent acute on chronic pancreatitis    History of Present Illness   Heather [name] is a 71 y.o. female with history of small bowel Crohn's disease diagnosed in January 2022 by small bowel capsule study, started on Humira in February 2022. She has history of pancreatitis in setting of triglyceridemia possibly related to Victoza.  History of CKD, depression, diabetes, GERD, anxiety, IBS, hyperlipidemia, stroke, salmonellosis.   Several admissions in the past year.  Admission in February 2023 for sepsis related to cellulitis.  She has been admitted with pneumonia.  In July, with acute pancreatitis.  Recent admission from August 22 through 25 for acute on chronic pancreatitis. Admission for pancreatitis in 2019, 2022 as well.  Last seen by her gastroenterologist in January 2022.  Patient presented to the ED yesterday with complaints of back pain/flank pain, double vision, chest pain and vomiting.  Reported her abdominal pain/left flank pain had gotten worse since her discharge on 06/22/22. She is a difficult historian. She is oriented X 3 but sometimes her answers do not make any sense. She states she always has diarrhea for years. She has history of IBS, Crohn's, and there has been consideration of EPI. She has been tried on numerous medications in the past including Lotronex, Creon, Xifaxan.   She tells me at baseline she has about one stool per day. Not always loose. Denies black or bloody stool. Complains of left flank pain that radiates to the right abdomen. States she has been vomiting. Has clear liquid tray now and says she really wants [place]. She states she  takes Humira and last dose was last week, it is not on her medication list.   In the ED: Glucose 518, sodium 126, BUN 47, creatinine 2.68 approximately at baseline, white blood cell count 9000, hemoglobin 7.7 (down from 8.62 weeks prior), platelets 151,000.  Magnesium 1.3, lipase 86.  LFTs normal.  Albumin 2.8.  Today her creatinine is improved at 2.56.  Sodium 131.  Hemoglobin 8.  Magnesium 1.9.  CT chest abdomen pelvis without contrast July 04, 2022: Marked inflammation of around the body and tail region of the pancreas suggesting acute pancreatitis.  Head less effective.  Complex comminuted ununited right humeral head neck fracture.  Right upper quadrant ultrasound July 05, 2022: Limited exam.  MRI abdomen MRCP without contrast July 05, 2022: Mild to moderate peripancreatic inflammatory changes seen.  Small peripancreatic fluid collection seen along the posterior and superior aspect of pancreatic tail measuring 2 x 1.4 cm consistent with small pseudocyst.  No pancreas divisum or pancreatic mass.  No pancreatic ductal dilatation.  Previous work up:  Triglyceride level: October 2021-985, February 2018-1488, August 2023-116  CT abdomen pelvis without contrast June 19, 2022: No biliary dilatation.  Status postcholecystectomy.  Infiltration of the fat surrounding the distal body and tail of pancreas with loss of fat plane between the pancreas and stomach similar to prior CT.  Findings most consistent with scarring and adhesions sequela of chronic pancreatitis.  Right upper quadrant ultrasound June 21, 2022: Hepatic steatosis  EGD April 2021: Normal  Colonoscopy May 2021: Examined portion of ileum normal.  Examined colon normal.  Colon biopsies negative.  Capsule endoscopy  January 2022: Several small aphthous ulcers throughout the small bowel  Prior to Admission medications   Medication Sig Start Date End Date Taking? Authorizing Provider  acetaminophen (TYLENOL) 325 MG  tablet Take 2 tablets (650 mg total) by mouth every 4 (four) hours. 12/01/21  Yes Johnson, Clanford L, MD  allopurinol (ZYLOPRIM) 100 MG tablet Take 100 mg by mouth daily. 03/20/21  Yes [provider]  Ascorbic Acid (VITAMIN C WITH ROSE HIPS) 500 MG tablet Take 500 mg by mouth every morning.   Yes [provider]  atorvastatin (LIPITOR) 80 MG tablet Take 1 tablet (80 mg total) by mouth daily. Restart on 06/29/22 06/22/22  Yes Tat, Shanon Brow, MD  busPIRone (BUSPAR) 10 MG tablet Take 10 mg by mouth 3 (three) times daily as needed for anxiety. 03/04/22  Yes [provider]  calcitRIOL (ROCALTROL) 0.25 MCG capsule Take 0.25 mcg by mouth 3 (three) times a week. Monday,Wed and Fri. 10/05/21  Yes [provider]  Cholecalciferol (VITAMIN D) 2000 units CAPS Take 2,000 Units by mouth every morning.    Yes [provider]  cyanocobalamin (,VITAMIN B-12,) 1000 MCG/ML injection Inject 1,000 mcg into the muscle every 30 (thirty) days.     Yes [provider]  dicyclomine (BENTYL) 20 MG tablet Take 1 tablet (20 mg total) by mouth in the morning and at bedtime. 12/01/21  Yes Johnson, Clanford L, MD  diphenhydrAMINE (BENADRYL) 2 % cream Apply topically 3 (three) times daily as needed for itching. 12/01/21  Yes Johnson, Clanford L, MD  esomeprazole (NEXIUM) 40 MG capsule Take 40 mg by mouth daily before breakfast.     Yes [provider]  furosemide (LASIX) 40 MG tablet Take 1 tablet (40 mg total) by mouth daily. 06/22/22  Yes Tat, Shanon Brow, MD  gabapentin (NEURONTIN) 300 MG capsule Take 1 capsule (300 mg total) by mouth at bedtime. 05/27/22  Yes Emokpae, [name], MD  hydrALAZINE (APRESOLINE) 50 MG tablet Take 1 tablet (50 mg total) by mouth 2 (two) times daily. 06/22/22  Yes Tat, Shanon Brow, MD  insulin detemir (LEVEMIR FLEXPEN) 100 UNIT/ML FlexPen Inject 10 Units into the skin at bedtime. 04/25/22  Yes Brita Romp, NP  levETIRAcetam (KEPPRA) 500 MG tablet Take 500 mg by  mouth 2 (two) times daily.   Yes [provider]  metoprolol succinate (TOPROL XL) 100 MG 24 hr tablet Take 1 tablet (100 mg total) by mouth daily. Take with or immediately following a meal. 05/27/22 05/27/23 Yes Emokpae, [name], MD  Semaglutide, 1 MG/DOSE, (OZEMPIC, 1 MG/DOSE,) 4 MG/3ML SOPN Inject 1 mg into the skin every 14 (fourteen) days.   Yes [provider]  temazepam (RESTORIL) 15 MG capsule Take 15 mg by mouth at bedtime as needed. 06/10/22  Yes [provider]  tiZANidine (ZANAFLEX) 4 MG tablet Take 4 mg by mouth 2 (two) times daily. 06/14/22  Yes [provider]  valsartan (DIOVAN) 80 MG tablet Take 80 mg by mouth daily. 05/17/22  Yes [provider]  ziprasidone (GEODON) 20 MG capsule Take 20 mg by mouth 2 (two) times daily. 06/09/22  Yes [provider]  insulin aspart (NOVOLOG) 100 UNIT/ML FlexPen Inject 0-12 Units into the skin 3 (three) times daily with meals. insulin aspart (novoLOG) injection 0-12 Units 0-12 Units Subcutaneous, 3 times daily with meals CBG < 70: Implement Hypoglycemia Standing Orders and refer to Hypoglycemia Standing Orders sidebar report  CBG 70 - 120: 0 unit CBG 121 - 150: 0 unit  CBG  151 - 200: 2 unit CBG 201 - 250: 4 units CBG 251 - 300: 6 units CBG 301 - 350: 8 units  CBG 351 - 400: 10 units  CBG > 400: 12 units Patient not taking: Reported on 06/20/2022 05/27/22   Roxan Hockey, MD    Current Facility-Administered Medications  Medication Dose Route Frequency Provider Last Rate Last Admin   0.9 %  sodium chloride infusion   Intravenous Continuous Manuella Ghazi, Pratik D, DO 100 mL/hr at 07/05/22 1213 New Bag at 07/05/22 1213   acetaminophen (TYLENOL) tablet 650 mg  650 mg Oral Q6H PRN Adefeso, Oladapo, DO   650 mg at 07/05/22 0310   atorvastatin (LIPITOR) tablet 80 mg  80 mg Oral Daily Adefeso, Oladapo, DO   80 mg at 07/05/22 0802   dicyclomine (BENTYL) capsule 20 mg  20 mg Oral BID Adefeso, Oladapo, DO   20 mg at  07/05/22 0801   feeding supplement (BOOST / RESOURCE BREEZE) liquid 1 Container  1 Container Oral TID BM Adefeso, Oladapo, DO       feeding supplement (GLUCERNA SHAKE) (GLUCERNA SHAKE) liquid 237 mL  237 mL Oral TID BM Adefeso, Oladapo, DO       heparin injection 5,000 Units  5,000 Units Subcutaneous Q8H Adefeso, Oladapo, DO   5,000 Units at 07/05/22 1215   hydrALAZINE (APRESOLINE) tablet 50 mg  50 mg Oral BID Adefeso, Oladapo, DO       HYDROmorphone (DILAUDID) injection 0.5 mg  0.5 mg Intravenous Q2H PRN Manuella Ghazi, Pratik D, DO   0.5 mg at 07/05/22 1213   insulin aspart (novoLOG) injection 0-5 Units  0-5 Units Subcutaneous QHS Adefeso, Oladapo, DO   2 Units at 07/04/22 2159   insulin aspart (novoLOG) injection 0-9 Units  0-9 Units Subcutaneous TID WC Adefeso, Oladapo, DO   2 Units at 07/05/22 1214   insulin detemir (LEVEMIR) injection 10 Units  10 Units Subcutaneous QHS Adefeso, Oladapo, DO       metoprolol succinate (TOPROL-XL) 24 hr tablet 100 mg  100 mg Oral Daily Adefeso, Oladapo, DO       ondansetron (ZOFRAN) tablet 4 mg  4 mg Oral Q6H PRN Adefeso, Oladapo, DO       Or   ondansetron (ZOFRAN) injection 4 mg  4 mg Intravenous Q6H PRN Adefeso, Oladapo, DO       ondansetron (ZOFRAN) injection 4 mg  4 mg Intravenous Q6H PRN Adefeso, Oladapo, DO       pantoprazole (PROTONIX) injection 40 mg  40 mg Intravenous Q24H Adefeso, Oladapo, DO   40 mg at 07/04/22 2205    Allergies as of 07/04/2022 - Review Complete 07/04/2022  Allergen Reaction Noted   Cozaar [losartan] Nausea And Vomiting, Swelling, and Rash 02/23/2015   Quinolones Itching, Nausea And Vomiting, Nausea Only, and Swelling 05/13/2012   Cymbalta [duloxetine hcl] Itching and Swelling 01/23/2016   Sulfa antibiotics Nausea And Vomiting and Swelling 05/13/2012   Synalar [fluocinolone] Swelling 01/23/2016   Cipro [ciprofloxacin hcl] Nausea And Vomiting and Swelling 03/16/2022   Diovan [valsartan] Nausea And Vomiting and Swelling 12/22/2014    Glucophage [metformin] Other (See Comments)    Ketalar [ketamine] Swelling 11/28/2021   Norco [hydrocodone-acetaminophen] Itching and Swelling 11/28/2021   Norvasc [amlodipine besylate] Itching and Swelling 11/28/2021   Nsaids Nausea And Vomiting and Other (See Comments) 08/29/2016   Tape Itching and Rash 06/15/2018   Zestril [lisinopril] Swelling and Rash     Past Medical History:  Diagnosis Date   Acute renal failure  superimposed on stage 3 chronic kidney disease ([place]) 01/21/2016   Anemia    Anxiety    B12 deficiency    Brain tumor (benign) ([place]) 10/29/2020   Cellulitis and abscess    Abdomen and buttocks   Chronic diastolic CHF (congestive heart failure) ([place]) 03/16/2022   CKD (chronic kidney disease) stage 3, GFR 30-59 ml/min (HCC)    Closed fracture of distal end of femur, unspecified fracture morphology, initial encounter ([place])    Coronary arteriosclerosis 07/15/2018   Crohn disease ([place])    Depression    Diabetic neuropathy (HCC)    feet   DJD (degenerative joint disease)    Right forminal stenosis C4-5   Essential hypertension    Femur fracture, right ([place]) 36/14/4315   Folliculitis    GERD (gastroesophageal reflux disease)    Gout    Headache(784.0)    Hiatal hernia    History of blood transfusion    History of bronchitis    History of cardiac catheterization    No significant CAD 2012   History of kidney stones    surgery to remove   Insomnia    Irritable bowel syndrome    Mixed hyperlipidemia    Osteoarthritis    Palpitations    Pneumonia    Reflux esophagitis    Salmonella    Stroke [facility])    Tinnitus    Right   Type 2 diabetes mellitus ([place])     Past Surgical History:  Procedure Laterality Date   BACK SURGERY     c-spine surgery     03/2007   CARDIAC CATHETERIZATION     CHOLECYSTECTOMY     COLONOSCOPY  04/21/2011; 05/29/11   6/12: [place] - ?AVM at IC valve, inflammatory changes at IC valve [facility]); 7/12 - [place]; IC valve  erosions, look chronic and probably Crohn's per path   colonoscopy  2017   [place]: random biopsies normal. TI normal   ESOPHAGOGASTRODUODENOSCOPY  05/29/11   normal   ESOPHAGOGASTRODUODENOSCOPY N/A 01/21/2016   Dr. Michail Sermon: normal   EYE SURGERY Bilateral    cataracts removed   FEMUR IM NAIL Right 04/10/2018   Procedure: INTRAMEDULLARY (IM) NAIL FEMORAL;  Surgeon: Paralee Cancel, MD;  Location: [facility];  Service: Orthopedics;  Laterality: Right;   FRACTURE SURGERY     GIVENS CAPSULE STUDY  11/17/2020   HARDWARE REMOVAL Right 04/10/2018   Procedure: HARDWARE REMOVAL RIGHT DISTAL FEMUR;  Surgeon: Paralee Cancel, MD;  Location: [facility];  Service: Orthopedics;  Laterality: Right;   HERNIA REPAIR     IR FLUORO GUIDE CV LINE RIGHT  04/01/2017   IR US GUIDE VASC ACCESS RIGHT  04/01/2017   JOINT REPLACEMENT Right    hip   OPEN REDUCTION INTERNAL FIXATION (ORIF) DISTAL RADIAL FRACTURE Right 05/28/2019   Procedure: OPEN REDUCTION INTERNAL FIXATION (ORIF) DISTAL RADIAL FRACTURE;  Surgeon: Verner Mould, MD;  Location: [place];  Service: Orthopedics;  Laterality: Right;  with block 90 minutes   ORIF FEMUR FRACTURE Right 03/31/2017   Procedure: OPEN REDUCTION INTERNAL FIXATION (ORIF) DISTAL FEMUR FRACTURE;  Surgeon: Paralee Cancel, MD;  Location: [place];  Service: Orthopedics;  Laterality: Right;   removal of kidney stone     Right knee arthroscopic surgery     TONSILLECTOMY     TOTAL ABDOMINAL HYSTERECTOMY      Family History  Problem Relation Age of Onset   Diabetes Mother    Colon cancer Brother        POSSIBLY.  Patient states diagnosed at age 71, then later states age 27. unclear and limited historian   Esophageal cancer Neg Hx    Rectal cancer Neg Hx    Stomach cancer Neg Hx     Social History   Socioeconomic History   Marital status: Married    Spouse name: Not on file   Number of children: 2   Years of education: Not on file   Highest education level: Not on file  Occupational  History   Occupation: Disabled  Tobacco Use   Smoking status: Never   Smokeless tobacco: Never  Vaping Use   Vaping Use: Never used  Substance and Sexual Activity   Alcohol use: No   Drug use: No   Sexual activity: Not on file    Comment: Hysterectomy  Other Topics Concern   Not on file  Social History Narrative   Patient is married 2 children she is disabled, 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    Social Determinants of Health   Financial Resource Strain: Not on file  Food Insecurity: No Food Insecurity (07/04/2022)   Hunger Vital Sign    Worried About Running Out of Food in the Last Year: Never true    Ran Out of Food in the Last Year: Never true  Transportation Needs: No Transportation Needs (07/04/2022)   PRAPARE - Hydrologist (Medical): No    Lack of Transportation (Non-Medical): No  Physical Activity: Not on file  Stress: Not on file  Social Connections: Not on file  Intimate Partner Violence: Not At Risk (07/04/2022)   Humiliation, Afraid, Rape, and Kick questionnaire    Fear of Current or Ex-Partner: No    Emotionally Abused: No    Physically Abused: No    Sexually Abused: No     Review of System:  Question reliability General: Negative for anorexia, weight loss, fever, chills, fatigue, weakness. Eyes: Negative for vision changes.  ENT: Negative for hoarseness, difficulty swallowing , nasal congestion. CV: Negative for chest pain, angina, palpitations, dyspnea on exertion, peripheral edema.  Respiratory: Negative for dyspnea at rest, dyspnea on exertion, cough, sputum, wheezing.  GI: See history of present illness. GU:  Negative for dysuria, hematuria, urinary incontinence, urinary frequency, nocturnal urination.  MS: +shoulder pain, no low back pain.  Derm:  Negative for rash or itching.  Neuro: Negative for weakness, abnormal sensation, seizure, frequent headaches, memory loss, confusion.  Psych: Negative for anxiety, depression, suicidal ideation, hallucinations.  Endo: Negative for unusual weight change.  Heme: Negative for bruising or bleeding. Allergy: Negative for rash or hives.      Physical Examination:   Vital signs in last 24 hours: Temp:  [97.7 F (36.5 C)-97.9 F (36.6 C)] 97.9 F (36.6 C) (09/07 0523) Pulse Rate:  [38-96] 80 (09/07 0757) Resp:  [13-20] 18 (09/07 0523) BP: (113-156)/(52-81) 118/52 (09/07 0757) SpO2:  [94 %-100 %] 99 % (09/07 0757) Weight:  [53.1 kg-53.9 kg] 53.9 kg (09/06 2138) Last BM Date : 07/03/22  General: chronically ill appearing female, appears older than stated age. Difficult historian, somewhat confused at times. no acute distress.  Head: Normocephalic, atraumatic.   Eyes: Conjunctiva pink, no icterus. Mouth: Oropharyngeal mucosa moist and pink , no lesions  erythema or exudate. Neck: Supple without thyromegaly, masses, or lymphadenopathy.  Lungs: Clear to auscultation bilaterally.  Heart: Regular rate and rhythm, no murmurs rubs or gallops.  Abdomen: Bowel sounds are normal,   nondistended, no hepatosplenomegaly or masses, no abdominal bruits or hernia , no rebound or guarding.  Minimal tenderness in luq and in rlq. Rectal: not performed Extremities: No lower extremity edema, clubbing, deformity.  Neuro: Alert and oriented x 4 , grossly normal neurologically.  Skin: Warm and dry, no rash or jaundice.   Psych: Alert and cooperative, normal mood and affect.        Intake/Output from previous day: 09/06 0701 - 09/07 0700 In: 790 [P.O.:240; IV Piggyback:550] Out: -  Intake/Output this shift: No intake/output data recorded.  Lab Results:   CBC Recent Labs    07/04/22 1516 07/04/22 2147 07/05/22 0342  WBC 9.0 11.0* 9.5  HGB 7.7* 8.0* 8.0*  HCT 23.2* 23.8* 24.3*  MCV 96.7 96.4 96.0   PLT 151 183 155   BMET Recent Labs    07/04/22 1516 07/04/22 2147 07/05/22 0342  NA 126*  --  131*  K 5.0  --  4.4  CL 96*  --  103  CO2 19*  --  21*  GLUCOSE 518*  --  131*  BUN 47*  --  46*  CREATININE 2.68* 2.70* 2.59*  CALCIUM 8.7*  --  9.0   LFT Recent Labs    07/04/22 1827 07/05/22 0342  BILITOT 0.6 0.7  BILIDIR 0.1  --   IBILI 0.5  --   ALKPHOS 96 93  AST 13* 15  ALT 16 15  PROT 6.6 6.5  ALBUMIN 2.8* 2.8*    Lipase Recent Labs    07/04/22 1827  LIPASE 86*   Lab Results  Component Value Date   IRON 161 05/17/2022   TIBC 265 05/17/2022   FERRITIN 1,852 (H) 05/17/2022   Lab Results  Component Value Date   VITAMINB12 952 (H) 03/16/2022   Lab Results  Component Value Date   FOLATE 14.4 03/16/2022   Lab Results  Component Value Date   HGBA1C 10.0 (H) 06/20/2022     PT/INR No results for input(s): "LABPROT", "INR" in the last 72 hours.   Hepatitis Panel No results for input(s): "HEPBSAG", "HCVAB", "HEPAIGM", "HEPBIGM" in the last 72 hours.   Imaging Studies:   MR ABDOMEN MRCP WO CONTRAST  Result Date: 07/05/2022 CLINICAL DATA:  Abdominal pain and vomiting.  Acute pancreatitis. EXAM: MRI ABDOMEN WITHOUT CONTRAST  (INCLUDING MRCP) TECHNIQUE: Multiplanar multisequence MR imaging of the abdomen was performed. Heavily T2-weighted images of the biliary and pancreatic ducts were obtained, and three-dimensional MRCP images were rendered by post processing. COMPARISON:  Noncontrast CT on 07/04/2022 FINDINGS: Lower chest: No acute findings. Hepatobiliary: No masses visualized on this unenhanced exam. Diffusely decreased hepatic T2 signal intensity, consistent with hemosiderosis. Prior cholecystectomy is noted. No evidence of biliary ductal dilatation, with common bile duct measuring 4 mm. No evidence of choledocholithiasis. Pancreas: No evidence of pancreatic mass or ductal dilatation. No evidence of pancreas divisum. Mild to moderate peripancreatic  inflammatory changes are seen. A small peripancreatic fluid collection is seen along the posterior and superior aspect of the pancreatic tail measuring 2.0 by 1.4 cm, consistent with a small pseudocyst. Spleen: Within normal limits in size. No splenic lesions identified. Diffusely decreased T2 signal intensity is consistent with transfusional siderosis. Adrenals/Urinary tract: Unremarkable. No evidence of mass or hydronephrosis. Stomach/Bowel: Unremarkable. Vascular/Lymphatic: No pathologically enlarged lymph nodes  identified. No evidence of abdominal aortic aneurysm. Other:  None. Musculoskeletal:  No suspicious bone lesions identified. IMPRESSION: Mild to moderate acute pancreatitis, with 2 cm pseudocyst adjacent the pancreatic tail. No evidence of pancreatic ductal dilatation or pancreas divisum. Prior cholecystectomy. No evidence of biliary ductal dilatation or choledocholithiasis. Transfusional siderosis. Electronically Signed   By: Marlaine Hind M.D.   On: 07/05/2022 11:39   MR 3D Recon At Scanner  Result Date: 07/05/2022 CLINICAL DATA:  Abdominal pain and vomiting.  Acute pancreatitis. EXAM: MRI ABDOMEN WITHOUT CONTRAST  (INCLUDING MRCP) TECHNIQUE: Multiplanar multisequence MR imaging of the abdomen was performed. Heavily T2-weighted images of the biliary and pancreatic ducts were obtained, and three-dimensional MRCP images were rendered by post processing. COMPARISON:  Noncontrast CT on 07/04/2022 FINDINGS: Lower chest: No acute findings. Hepatobiliary: No masses visualized on this unenhanced exam. Diffusely decreased hepatic T2 signal intensity, consistent with hemosiderosis. Prior cholecystectomy is noted. No evidence of biliary ductal dilatation, with common bile duct measuring 4 mm. No evidence of choledocholithiasis. Pancreas: No evidence of pancreatic mass or ductal dilatation. No evidence of pancreas divisum. Mild to moderate peripancreatic inflammatory changes are seen. A small peripancreatic fluid  collection is seen along the posterior and superior aspect of the pancreatic tail measuring 2.0 by 1.4 cm, consistent with a small pseudocyst. Spleen: Within normal limits in size. No splenic lesions identified. Diffusely decreased T2 signal intensity is consistent with transfusional siderosis. Adrenals/Urinary tract: Unremarkable. No evidence of mass or hydronephrosis. Stomach/Bowel: Unremarkable. Vascular/Lymphatic: No pathologically enlarged lymph nodes identified. No evidence of abdominal aortic aneurysm. Other:  None. Musculoskeletal:  No suspicious bone lesions identified. IMPRESSION: Mild to moderate acute pancreatitis, with 2 cm pseudocyst adjacent the pancreatic tail. No evidence of pancreatic ductal dilatation or pancreas divisum. Prior cholecystectomy. No evidence of biliary ductal dilatation or choledocholithiasis. Transfusional siderosis. Electronically Signed   By: Marlaine Hind M.D.   On: 07/05/2022 11:39   US Abdomen Limited  Result Date: 07/05/2022 CLINICAL DATA:  Abdominal pain EXAM: ULTRASOUND ABDOMEN LIMITED RIGHT UPPER QUADRANT COMPARISON:  CT abdomen pelvis 07/04/2022 FINDINGS: Gallbladder: Status post cholecystectomy Common bile duct: Diameter: 5 mm Liver: Parenchymal echogenicity: Within normal limits Contours: Normal Lesions: None Portal vein: Limited visualization. Other: None. IMPRESSION: 1. No significant sonographic abnormality of the liver. 2. Limited assessment of the portal vein. Electronically Signed   By: Miachel Roux M.D.   On: 07/05/2022 09:17   CT CHEST ABDOMEN PELVIS WO CONTRAST  Result Date: 07/04/2022 CLINICAL DATA:  Shortness of breath, chest pain and vomiting today. EXAM: CT CHEST, ABDOMEN AND PELVIS WITHOUT CONTRAST TECHNIQUE: Multidetector CT imaging of the chest, abdomen and pelvis was performed following the standard protocol without IV contrast. RADIATION DOSE REDUCTION: This exam was performed according to the departmental dose-optimization program which includes  automated exposure control, adjustment of the mA and/or kV according to patient size and/or use of iterative reconstruction technique. COMPARISON:  Abdominal CT scan 06/19/2022 FINDINGS: CT CHEST FINDINGS Cardiovascular: The heart is normal in size. No pericardial effusion. The aorta is normal in caliber. Age advanced atherosclerotic calcification involving the aorta and coronary arteries. Mediastinum/Nodes: No mediastinal or hilar mass or adenopathy. The esophagus is grossly normal. Lungs/Pleura: No infiltrates, edema or effusions. No worrisome pulmonary lesions or pulmonary nodules. No interstitial lung disease or bronchiectasis. Musculoskeletal: There is a complex comminuted fragmented ununited right humeral head and neck fracture remote T12 compression fracture. CT ABDOMEN PELVIS FINDINGS Hepatobiliary: No obvious hepatic lesions or intrahepatic biliary dilatation. The gallbladder is surgically  absent. No common bile duct dilatation. Pancreas: There is marked inflammation of and around the body and tail region of the pancreas suggesting acute pancreatitis. The head is less affected. Spleen: The spleen is upper limits of normal in size and stable. Extensive calcification of the splenic artery noted. Adrenals/Urinary Tract: The adrenal glands and kidneys are grossly normal without contrast. A small right renal calculus is noted. The bladder is unremarkable. Stomach/Bowel: Stomach, duodenum, small bowel and colon are grossly normal. Moderate diffuse colonic diverticulosis without findings for acute diverticulitis. No findings for small bowel obstruction and free air. Vascular/Lymphatic: Advanced atherosclerotic calcification involving the aorta and branch vessels but no aneurysm. No mesenteric or retroperitoneal mass or adenopathy. Reproductive: Surgically absent. Other: No pelvic mass or adenopathy. No free pelvic fluid collections. No inguinal mass or adenopathy. No abdominal wall hernia or subcutaneous lesions.  Musculoskeletal: Extensive lumbar fusion hardware. Prior right hip fracture fixation and evidence of prior pelvic fractures. No acute bony findings. IMPRESSION: 1. CT findings consistent with acute pancreatitis. 2. Age advanced atherosclerotic calcification involving the aorta and branch vessels including the coronary arteries. 3. No acute pulmonary findings or worrisome pulmonary lesions. 4. Complex comminuted ununited right humeral head and neck fracture. Aortic Atherosclerosis (ICD10-I70.0). Electronically Signed   By: Marijo Sanes M.D.   On: 07/04/2022 17:27   DG Chest 2 View  Result Date: 07/04/2022 CLINICAL DATA:  Left-sided chest pain EXAM: CHEST - 2 VIEW COMPARISON:  06/19/2022 FINDINGS: The heart size and mediastinal contours are within normal limits. Both lungs are clear. Unchanged fracture of the proximal right humerus and high-grade wedge deformity of T12. IMPRESSION: No acute abnormality of the lungs. Electronically Signed   By: Delanna Ahmadi M.D.   On: 07/04/2022 13:59   US Abdomen Limited RUQ (LIVER/GB)  Result Date: 06/21/2022 CLINICAL DATA:  Elevated LFTs EXAM: ULTRASOUND ABDOMEN LIMITED RIGHT UPPER QUADRANT COMPARISON:  None Available. FINDINGS: Gallbladder: Gallbladder is surgically absent. Common bile duct: Diameter: 8 mm, within normal limits status post cholecystectomy. Liver: No focal lesion identified. Increased parenchymal echogenicity. Portal vein is patent on color Doppler imaging with normal direction of blood flow towards the liver. Other: None. IMPRESSION: Hepatic steatosis. Electronically Signed   By: Yetta Glassman M.D.   On: 06/21/2022 16:04   MR LUMBAR SPINE WO CONTRAST  Result Date: 06/20/2022 CLINICAL DATA:  Low back pain and right leg weakness. T12 compression fracture. Pain since fall in January. EXAM: MRI LUMBAR SPINE WITHOUT CONTRAST TECHNIQUE: Multiplanar, multisequence MR imaging of the lumbar spine was performed. No intravenous contrast was administered.  COMPARISON:  CT of the abdomen and pelvis 06/19/2022 and 05/26/2022 FINDINGS: Segmentation: 5 non rib-bearing lumbar type vertebral bodies are present. The lowest fully formed vertebral body is L5. Alignment: Slight retrolisthesis at L1-2 is stable. 3 mm of anterolisthesis is present at T11-12. No other significant listhesis is present. Lumbar lordosis is preserved. Vertebrae: A superior endplate fracture at F64 demonstrates fluid within the fracture cleft and some edema in the upper portion of the vertebral body and more extensively on the right. 40% loss of height is present anteriorly. No focal retropulsed bone is present although there is slight anterolisthesis at T11-12. Marrow signal at L1, L2, L3 and L4 is somewhat obscured by hardware. Vertebral body heights and marrow signal at T11, L5 and the sacrum are otherwise normal. Conus medullaris and cauda equina: Conus extends to the T12-L1 level. Conus and cauda equina appear normal. Paraspinal and other soft tissues: Limited imaging the abdomen is  unremarkable. There is no significant adenopathy. No solid organ lesions are present. Disc levels: T12-L1: T11-12: Mild right foraminal narrowing is secondary to facet spurring. The central canal is patent. T12-L1: Minimal disc bulging is present without significant stenosis. L1-2: Posterior hardware is in place. No significant stenosis is present. L2-3: Lumbar fusion and discectomy noted. No residual or recurrent stenosis is present. L3-4: Discectomy and lumbar fusion noted. No residual or recurrent stenosis is present. L4-5: A mild broad-based disc protrusion is present. Moderate facet hypertrophy is noted bilaterally. The central canal is patent. Mild left subarticular narrowing and moderate left foraminal stenosis is present. Mild right foraminal narrowing is present. L5-S1: Mild asymmetric right-sided facet hypertrophy is present. No significant disc protrusion or stenosis is present. IMPRESSION: 1. Subacute to  early chronic superior endplate fracture at H82 with 40% loss of height anteriorly. No focal retropulsed bone is present. Residual edema is present in the vertebral body, worse on the right 2. Mild right foraminal narrowing at T12-L1 secondary to facet spurring. 3. Lumbar fusion at L1-2 and L3-4 without significant residual or recurrent stenosis at these levels. 4. Mild left subarticular and moderate left foraminal stenosis at L4-5. 5. Mild right foraminal narrowing at L4-5. 6. Mild asymmetric right-sided facet hypertrophy at L5-S1 without significant stenosis. Electronically Signed   By: San Morelle M.D.   On: 06/20/2022 12:41   MR BRAIN WO CONTRAST  Result Date: 06/20/2022 CLINICAL DATA:  Provided history: Neuro deficit, acute, stroke suspected; right leg weakness. EXAM: MRI HEAD WITHOUT CONTRAST TECHNIQUE: Multiplanar, multiecho pulse sequences of the brain and surrounding structures were obtained without intravenous contrast. COMPARISON:  Head CT 06/20/2022. Prior brain MRI examinations 03/09/2022 and earlier. FINDINGS: Mild intermittent motion degradation. Brain: Mild generalized parenchymal atrophy. 6 mm extra-axial dural-based mass overlying the anterior right frontal lobe compatible with a small meningioma (series 12, image 25). Symmetric remote insults within the globus pallidus, bilaterally. Multifocal T2 FLAIR hyperintense signal abnormality within the cerebral white matter and pons, nonspecific but compatible with mild chronic small vessel ischemic disease. Tiny chronic infarcts within the left cerebellar hemisphere. There is no acute infarct. No evidence of an intracranial mass. No chronic intracranial blood products. No extra-axial fluid collection. No midline shift. Vascular: Maintained flow voids within the proximal large arterial vessels. Skull and upper cervical spine: No focal suspicious marrow lesion. Susceptibility artifact arising from ACDF hardware. Sinuses/Orbits: No mass or  acute finding within the imaged orbits. No significant paranasal sinus disease. Other: Trace fluid within the left mastoid air cells. IMPRESSION: 1. Mildly motion degraded exam. 2. No evidence of acute intracranial abnormality. 3. Stable non-contrast MRI appearance of the brain. 4. 6 mm meningioma overlying the anterior right frontal lobe. 5. Symmetric foci of signal abnormality within the globus pallidus bilaterally, which may reflect chronic lacunar infarcts or sequelae of a prior toxic/metabolic insult. 6. Mild chronic small ischemic changes within the cerebral white matter and pons. 7. Tiny chronic infarcts within the left cerebellar hemisphere. 8. Mild generalized parenchymal atrophy. Electronically Signed   By: Kellie Simmering D.O.   On: 06/20/2022 12:37   CT HEAD WO CONTRAST (5MM)  Result Date: 06/20/2022 CLINICAL DATA:  71 year old female with altered mental status, right leg weakness. EXAM: CT HEAD WITHOUT CONTRAST TECHNIQUE: Contiguous axial images were obtained from the base of the skull through the vertex without intravenous contrast. RADIATION DOSE REDUCTION: This exam was performed according to the departmental dose-optimization program which includes automated exposure control, adjustment of the mA and/or kV according  to patient size and/or use of iterative reconstruction technique. COMPARISON:  Head CT 03/14/2022, brain MRI 03/09/2022. FINDINGS: Brain: Stable cerebral volume. No midline shift, mass effect, or evidence of intracranial mass lesion. No ventriculomegaly. No acute intracranial hemorrhage identified. Chronic lacunar infarct at the left genu internal capsule is stable along with mild additional bilateral deep white matter heterogeneity. Preserved gray-white matter differentiation otherwise. No definite cortical encephalomalacia. Vascular: Extensive Calcified atherosclerosis at the skull base. No suspicious intracranial vascular hyperdensity. Skull: No acute osseous abnormality identified.  Sinuses/Orbits: Visualized paranasal sinuses and mastoids are stable and well aerated. Other: No acute orbit or scalp soft tissue finding. IMPRESSION: No acute intracranial abnormality. Stable non contrast CT appearance of chronic small vessel disease. Electronically Signed   By: Genevie [name] M.D.   On: 06/20/2022 06:06   DG Chest 2 View  Result Date: 06/19/2022 CLINICAL DATA:  Chest pain EXAM: CHEST - 2 VIEW COMPARISON:  Radiographs 03/08/2022 FINDINGS: No focal consolidation, pleural effusion, or pneumothorax. Normal cardiomediastinal silhouette. No acute osseous abnormality. Chronic displaced fracture of the proximal right humerus. Aortic calcification. Cervical spine fusion hardware. IMPRESSION: No active cardiopulmonary disease. Electronically Signed   By: Placido Sou M.D.   On: 06/19/2022 20:25   CT ABDOMEN PELVIS WO CONTRAST  Result Date: 06/19/2022 CLINICAL DATA:  Abdominal pain. EXAM: CT ABDOMEN AND PELVIS WITHOUT CONTRAST TECHNIQUE: Multidetector CT imaging of the abdomen and pelvis was performed following the standard protocol without IV contrast. RADIATION DOSE REDUCTION: This exam was performed according to the departmental dose-optimization program which includes automated exposure control, adjustment of the mA and/or kV according to patient size and/or use of iterative reconstruction technique. COMPARISON:  CT abdomen pelvis dated 05/26/2022. FINDINGS: Evaluation of this exam is limited in the absence of intravenous contrast. Lower chest: The visualized lung bases are clear. There is coronary vascular calcification and calcification of the mitral annulus. No intra-abdominal free air or free fluid. Hepatobiliary: The liver is unremarkable. No biliary dilatation. Cholecystectomy. No retained calcified stone noted in the central CBD. Pancreas: There is infiltration of the fat surrounding the distal body and tail of the pancreas with loss of fat plane between the pancreas and stomach similar to  prior CT. Findings most consistent with scarring and adhesions sequela of chronic pancreatitis. Correlation with pancreatic enzymes recommended to evaluate for possibility of acute on chronic pancreatitis. No drainable fluid collection, abscess, or pseudocyst. Spleen: Normal in size without focal abnormality. Adrenals/Urinary Tract: The adrenal glands unremarkable. Mild bilateral renal parenchyma atrophy. There is a 2 mm nonobstructing right renal mid pole calculus. No hydronephrosis on either side. Subcentimeter exophytic left renal interpolar hypodense lesion is too small to characterize but appears similar to prior CT, likely a cyst. The visualized ureters and urinary bladder appear unremarkable. Stomach/Bowel: There is moderate stool throughout the colon. There is no bowel obstruction or active inflammation. The appendix is not visualized with certainty. No inflammatory changes identified in the right lower quadrant. Vascular/Lymphatic: Advanced aortoiliac atherosclerotic disease. The IVC is unremarkable. No portal venous gas. There is no adenopathy. Reproductive: Hysterectomy.  No adnexal masses. Other: None Musculoskeletal: There is osteopenia with degenerative changes spine and multilevel posterior fusion hardware. Similar appearance of T12 compression fracture as the prior CT. No new fracture. IMPRESSION: 1. No bowel obstruction. 2. A 2 mm nonobstructing right renal mid pole calculus. No hydronephrosis. 3. Infiltration of the fat surrounding the distal body and tail of the pancreas with loss of fat plane between the pancreas and stomach similar  to prior CT. Findings most consistent with scarring and adhesions sequela of chronic pancreatitis. Correlation with pancreatic enzymes recommended to evaluate for possibility of acute on chronic pancreatitis. No drainable fluid collection, abscess, or pseudocyst. 4. T12 compression fracture similar to prior CT. 5. Aortic Atherosclerosis (ICD10-I70.0). Electronically  Signed   By: Anner Crete M.D.   On: 06/19/2022 20:23  [4 week]  Assessment:   Pleasant 71 year old female with complex medical history including chronic kidney disease, CHF, hypertension, stroke, small bowel Crohn's disease, recurrent pancreatitis presenting after recent discharge for ongoing worsening abdominal pain, chest pain, vomiting.  GI has been consulted for recurrent acute on chronic pancreatitis.  Pancreatitis: acute pancreatitis with pseudocyst noted on MR today. No evidence of pancreatic duct dilation, pancreas divisum, mass. No biliary duct dilation. History of hypertriglyceridemia in the past as outlined, with levels that can be associated with pancreatitis but recently have been normal. She has suspected pancreatitis in the past on GLP-1 Agonist, liraglutide. Currently appears that she is on semaglutide which can also be associated with pancreatitis. Previously IgG 4 unremarkable. Patient currently tolerating clear liquid diet.   Normocytic anemia: no evidence of IDA. No overt GI bleeding. Last EGD/colonosocpy 2021. Capsule endoscopy in 2022. Likely multifactorial with large component due to anemia of chronic disease.   Small bowel Crohn's: she states she is on Humira but she has not seen her GI since 10/2020. I questioned if she is actually still taking having had no follow up. No diarrhea while inpatient. Would recommend outpatient follow up with Dr. Carlean Purl.   Plan:   Supportive measures. Clinically appears to be feeling better this afternoon. Would consider stopping GLP-1 Agonists due to potential cause of her pancreatitis.  Follow up with primary GI, Dr. Carlean Purl, as outpatient.    LOS: 1 day   We would like to thank you for the opportunity to participate in the care of [name].  Laureen Ochs. Bernarda Caffey [facility] [phone] 9/7/202312:18 PM

## 2022-07-05 NOTE — Telephone Encounter (Signed)
Pt is currently admitted to hospital.

## 2022-07-05 NOTE — Progress Notes (Signed)
PROGRESS NOTE    Heather [name]  WUJ:[id number] DOB: 11-05-1950 DOA: 07/04/2022 PCP: [name], MD   Brief Narrative:   Heather [name] is a 71 y.o. female with medical history significant of hypertension, hyperlipidemia, T2DM, CKD stage IV, Crohn's disease, chronic pancreatitis, anxiety, stroke, IBS with diarrhea who presents to the emergency department due to 3-day onset of left flank pain with radiation of pain to left upper quadrant and epigastric area.  She was admitted for recurrent acute on chronic pancreatitis and was just discharged for the same on 8/25.  GI consulted for further recommendations.  Assessment & Plan:   Principal Problem:   Acute on chronic pancreatitis (HCC) Active Problems:   Chronic kidney disease (CKD), stage IV (severe) (HCC)   Nausea & vomiting   GERD (gastroesophageal reflux disease)   Hypomagnesemia   Visual disturbance   Hyponatremia   Multiple falls   Humeral head fracture  Assessment and Plan:   Acute on chronic pancreatitis Nausea and vomiting Continue IV Zofran p.r.n Continue IV morphine p.r.n for pain Continue Protonix Continue IV NS at 111m/Hr Continue clear liquid diet with plan to advance diet as tolerated MRCP ordered per GI recommendations, appreciate further evaluation Lipid panel recently performed with no hypertriglyceridemia   Falls at home Continue fall precaution and neurochecks Continue PT/OT eval and treat   Right humeral head and neck fracture Patient follows with an orthopedic surgeon and has an impending surgical procedure pending better control of blood glucose level    Hyponatremia possibly due to dehydration Improving, continue to monitor with IV hydration   Hypoalbuminemia possibly secondary to moderate protein calorie malnutrition Albumin 2.8, protein supplement to be provided   CKD stage IV Creatinine 2.68 (creatinine is within baseline range). Renally adjust medications, avoid nephrotoxic  agents/dehydration/hypotension     T2DM with uncontrolled hyperglycemia Continue Levemir 10 units nightly Continue ISS and hypoglycemia protocol   Essential hypertension Continue hydralazine and metoprolol   Mixed hyperlipidemia Continue Lipitor   GERD Continue Protonix   IBS Continue Bentyl   Visual disturbance MRI done on 8/23 showed 6 mm meningioma overlying the anterior right frontal lobe   Crohn's disease Stable    DVT prophylaxis: Heparin Code Status: Full Family Communication: None at bedside Disposition Plan:  Status is: Inpatient Remains inpatient appropriate because: Need for IV medications and fluids    Skin Assessment:  I have examined the patient's skin and I agree with the wound assessment as performed by the wound care RN as outlined below:  Pressure Injury 08/26/21 Buttocks Right;Left Stage 1 -  Intact skin with non-blanchable redness of a localized area usually over a bony prominence. (Active)  08/26/21 0050  Location: Buttocks  Location Orientation: Right;Left  Staging: Stage 1 -  Intact skin with non-blanchable redness of a localized area usually over a bony prominence.  Wound Description (Comments):   Present on Admission: Yes     Pressure Injury 03/16/22 Buttocks Left;Medial Stage 2 -  Partial thickness loss of dermis presenting as a shallow open injury with a red, pink wound bed without slough. pink wound bed, perimeter reddened and purple (Active)  03/16/22 0015  Location: Buttocks  Location Orientation: Left;Medial  Staging: Stage 2 -  Partial thickness loss of dermis presenting as a shallow open injury with a red, pink wound bed without slough.  Wound Description (Comments): pink wound bed, perimeter reddened and purple  Present on Admission: Yes  Dressing Type Foam - Lift dressing to assess site every shift  06/20/22 0855     Pressure Injury 06/21/22 Coccyx Right Stage 1 -  Intact skin with non-blanchable redness of a localized area  usually over a bony prominence. red circular 0.5cm pressure injury directly on boney sacrum (Active)  06/21/22 1000  Location: Coccyx  Location Orientation: Right  Staging: Stage 1 -  Intact skin with non-blanchable redness of a localized area usually over a bony prominence.  Wound Description (Comments): red circular 0.5cm pressure injury directly on boney sacrum  Present on Admission:   Dressing Type Foam - Lift dressing to assess site every shift 06/21/22 1816    Consultants:  GI  Procedures:  None  Antimicrobials:  None   Subjective: Patient seen and evaluated today with ongoing severe abdominal pain and nausea.  She has had 2 episodes of vomiting this morning.  She claims to have lost approximately 20 pounds of weight in the last 2 months due to poor oral intake and fear of recurrent pancreatitis.  Objective: Vitals:   07/04/22 2123 07/04/22 2138 07/05/22 0523 07/05/22 0757  BP: (!) 156/67  (!) 122/53 (!) 118/52  Pulse: 82  78 80  Resp: 18  18   Temp: 97.7 F (36.5 C)  97.9 F (36.6 C)   TempSrc: Oral  Oral   SpO2: 100%  98% 99%  Weight:  53.9 kg    Height:  '5\' 3"'$  (1.6 m)      Intake/Output Summary (Last 24 hours) at 07/05/2022 1039 Last data filed at 07/05/2022 0643 Gross per 24 hour  Intake 790 ml  Output --  Net 790 ml   Filed Weights   07/04/22 1323 07/04/22 2138  Weight: 53.1 kg 53.9 kg    Examination:  General exam: Appears calm and comfortable  Respiratory system: Clear to auscultation. Respiratory effort normal. Cardiovascular system: S1 & S2 heard, RRR.  Gastrointestinal system: Abdomen is soft Central nervous system: Alert and awake Extremities: No edema Skin: No significant lesions noted Psychiatry: Flat affect.    Data Reviewed: I have personally reviewed following labs and imaging studies  CBC: Recent Labs  Lab 07/04/22 1516 07/04/22 2147 07/05/22 0342  WBC 9.0 11.0* 9.5  HGB 7.7* 8.0* 8.0*  HCT 23.2* 23.8* 24.3*  MCV 96.7 96.4  96.0  PLT 151 183 564   Basic Metabolic Panel: Recent Labs  Lab 07/04/22 1516 07/04/22 2147 07/05/22 0342  NA 126*  --  131*  K 5.0  --  4.4  CL 96*  --  103  CO2 19*  --  21*  GLUCOSE 518*  --  131*  BUN 47*  --  46*  CREATININE 2.68* 2.70* 2.59*  CALCIUM 8.7*  --  9.0  MG 1.3*  --  1.9  PHOS  --   --  4.1   GFR: Estimated Creatinine Clearance: 17 mL/min (A) (by C-G formula based on SCr of 2.59 mg/dL (H)). Liver Function Tests: Recent Labs  Lab 07/04/22 1827 07/05/22 0342  AST 13* 15  ALT 16 15  ALKPHOS 96 93  BILITOT 0.6 0.7  PROT 6.6 6.5  ALBUMIN 2.8* 2.8*   Recent Labs  Lab 07/04/22 1827  LIPASE 86*   No results for input(s): "AMMONIA" in the last 168 hours. Coagulation Profile: No results for input(s): "INR", "PROTIME" in the last 168 hours. Cardiac Enzymes: No results for input(s): "CKTOTAL", "CKMB", "CKMBINDEX", "TROPONINI" in the last 168 hours. BNP (last 3 results) No results for input(s): "PROBNP" in the last 8760 hours. HbA1C: No results for input(s): "  HGBA1C" in the last 72 hours. CBG: Recent Labs  Lab 07/04/22 1907  GLUCAP 379*   Lipid Profile: No results for input(s): "CHOL", "HDL", "LDLCALC", "TRIG", "CHOLHDL", "LDLDIRECT" in the last 72 hours. Thyroid Function Tests: No results for input(s): "TSH", "T4TOTAL", "FREET4", "T3FREE", "THYROIDAB" in the last 72 hours. Anemia Panel: No results for input(s): "VITAMINB12", "FOLATE", "FERRITIN", "TIBC", "IRON", "RETICCTPCT" in the last 72 hours. Sepsis Labs: No results for input(s): "PROCALCITON", "LATICACIDVEN" in the last 168 hours.  No results found for this or any previous visit (from the past 240 hour(s)).       Radiology Studies: US Abdomen Limited  Result Date: 07/05/2022 CLINICAL DATA:  Abdominal pain EXAM: ULTRASOUND ABDOMEN LIMITED RIGHT UPPER QUADRANT COMPARISON:  CT abdomen pelvis 07/04/2022 FINDINGS: Gallbladder: Status post cholecystectomy Common bile duct: Diameter: 5 mm  Liver: Parenchymal echogenicity: Within normal limits Contours: Normal Lesions: None Portal vein: Limited visualization. Other: None. IMPRESSION: 1. No significant sonographic abnormality of the liver. 2. Limited assessment of the portal vein. Electronically Signed   By: Miachel Roux M.D.   On: 07/05/2022 09:17   CT CHEST ABDOMEN PELVIS WO CONTRAST  Result Date: 07/04/2022 CLINICAL DATA:  Shortness of breath, chest pain and vomiting today. EXAM: CT CHEST, ABDOMEN AND PELVIS WITHOUT CONTRAST TECHNIQUE: Multidetector CT imaging of the chest, abdomen and pelvis was performed following the standard protocol without IV contrast. RADIATION DOSE REDUCTION: This exam was performed according to the departmental dose-optimization program which includes automated exposure control, adjustment of the mA and/or kV according to patient size and/or use of iterative reconstruction technique. COMPARISON:  Abdominal CT scan 06/19/2022 FINDINGS: CT CHEST FINDINGS Cardiovascular: The heart is normal in size. No pericardial effusion. The aorta is normal in caliber. Age advanced atherosclerotic calcification involving the aorta and coronary arteries. Mediastinum/Nodes: No mediastinal or hilar mass or adenopathy. The esophagus is grossly normal. Lungs/Pleura: No infiltrates, edema or effusions. No worrisome pulmonary lesions or pulmonary nodules. No interstitial lung disease or bronchiectasis. Musculoskeletal: There is a complex comminuted fragmented ununited right humeral head and neck fracture remote T12 compression fracture. CT ABDOMEN PELVIS FINDINGS Hepatobiliary: No obvious hepatic lesions or intrahepatic biliary dilatation. The gallbladder is surgically absent. No common bile duct dilatation. Pancreas: There is marked inflammation of and around the body and tail region of the pancreas suggesting acute pancreatitis. The head is less affected. Spleen: The spleen is upper limits of normal in size and stable. Extensive calcification  of the splenic artery noted. Adrenals/Urinary Tract: The adrenal glands and kidneys are grossly normal without contrast. A small right renal calculus is noted. The bladder is unremarkable. Stomach/Bowel: Stomach, duodenum, small bowel and colon are grossly normal. Moderate diffuse colonic diverticulosis without findings for acute diverticulitis. No findings for small bowel obstruction and free air. Vascular/Lymphatic: Advanced atherosclerotic calcification involving the aorta and branch vessels but no aneurysm. No mesenteric or retroperitoneal mass or adenopathy. Reproductive: Surgically absent. Other: No pelvic mass or adenopathy. No free pelvic fluid collections. No inguinal mass or adenopathy. No abdominal wall hernia or subcutaneous lesions. Musculoskeletal: Extensive lumbar fusion hardware. Prior right hip fracture fixation and evidence of prior pelvic fractures. No acute bony findings. IMPRESSION: 1. CT findings consistent with acute pancreatitis. 2. Age advanced atherosclerotic calcification involving the aorta and branch vessels including the coronary arteries. 3. No acute pulmonary findings or worrisome pulmonary lesions. 4. Complex comminuted ununited right humeral head and neck fracture. Aortic Atherosclerosis (ICD10-I70.0). Electronically Signed   By: Marijo Sanes M.D.   On: 07/04/2022  17:27   DG Chest 2 View  Result Date: 07/04/2022 CLINICAL DATA:  Left-sided chest pain EXAM: CHEST - 2 VIEW COMPARISON:  06/19/2022 FINDINGS: The heart size and mediastinal contours are within normal limits. Both lungs are clear. Unchanged fracture of the proximal right humerus and high-grade wedge deformity of T12. IMPRESSION: No acute abnormality of the lungs. Electronically Signed   By: Delanna Ahmadi M.D.   On: 07/04/2022 13:59        Scheduled Meds:  atorvastatin  80 mg Oral Daily   dicyclomine  20 mg Oral BID   feeding supplement  1 Container Oral TID BM   feeding supplement (GLUCERNA SHAKE)  237 mL Oral  TID BM   heparin  5,000 Units Subcutaneous Q8H   hydrALAZINE  50 mg Oral BID   insulin aspart  0-5 Units Subcutaneous QHS   insulin aspart  0-9 Units Subcutaneous TID WC   insulin detemir  10 Units Subcutaneous QHS   metoprolol succinate  100 mg Oral Daily   pantoprazole (PROTONIX) IV  40 mg Intravenous Q24H   Continuous Infusions:  lactated ringers       LOS: 1 day    Time spent: 35 minutes    Asjia Berrios Darleen Crocker, DO [facility]  If 7PM-7AM, please contact night-coverage [email or web address] 07/05/2022, 10:39 AM

## 2022-07-05 NOTE — Progress Notes (Signed)
CBG 224 @ 2123. Pt ID did not register in glucometer.

## 2022-07-05 NOTE — TOC Initial Note (Signed)
Transition of Care [facility]) - Initial/Assessment Note    Patient Details  Name: Heather [name] MRN: [id number] Date of Birth: Mar 16, 1951  [facility] CM/SW Contact:    Iona Beard, [place] Phone Number: 07/05/2022, 11:44 AM  Clinical Narrative:                 Pt is high risk for readmission. CSW spoke with pts husband to complete assessment. Pt is independent in completing ADLs. Pts husband provides transportation to appointments. Pt has had HH in the past, pts husband unsure which company was used. Pt has a cane, walker, and wheelchair to use when needed. TOC to follow.   Expected Discharge Plan: Home/Self Care Barriers to Discharge: Continued Medical Work up   Patient Goals and CMS Choice Patient states their goals for this hospitalization and ongoing recovery are:: return home CMS Medicare.gov Compare Post Acute Care list provided to:: Patient Choice offered to / list presented to : Patient  Expected Discharge Plan and Services Expected Discharge Plan: Home/Self Care In-house Referral: Clinical Social Work Discharge Planning Services: CM Consult   Living arrangements for the past 2 months: Single Family Home                                      Prior Living Arrangements/Services Living arrangements for the past 2 months: Single Family Home Lives with:: Spouse Patient language and need for interpreter reviewed:: Yes Do you feel safe going back to the place where you live?: Yes      Need for Family Participation in Patient Care: Yes (Comment) Care giver support system in place?: Yes (comment) Current home services: DME    Activities of Daily Living Home Assistive Devices/Equipment: Wheelchair ADL Screening (condition at time of admission) Patient's cognitive ability adequate to safely complete daily activities?: Yes Is the patient deaf or have difficulty hearing?: No Does the patient have difficulty seeing, even when wearing glasses/contacts?:  No Does the patient have difficulty concentrating, remembering, or making decisions?: No Patient able to express need for assistance with ADLs?: Yes Does the patient have difficulty dressing or bathing?: No Independently performs ADLs?: Yes (appropriate for developmental age) Does the patient have difficulty walking or climbing stairs?: No Weakness of Legs: Both Weakness of Arms/Hands: None  Permission Sought/Granted                  Emotional Assessment Appearance:: Appears stated age Attitude/Demeanor/Rapport: Engaged Affect (typically observed): Accepting Orientation: : Oriented to Self, Oriented to Place, Oriented to  Time, Oriented to Situation Alcohol / Substance Use: Not Applicable Psych Involvement: No (comment)  Admission diagnosis:  Acute pancreatitis [K85.90] Anemia, unspecified type [D64.9] Acute pancreatitis, unspecified complication status, unspecified pancreatitis type [K85.90] Patient Active Problem List   Diagnosis Date Noted   Multiple falls 07/04/2022   Humeral head fracture 07/04/2022   Right leg weakness 06/20/2022   Anxiety 06/20/2022   UTI (urinary tract infection) 06/20/2022   Malnutrition of moderate degree 06/20/2022   Hyperkalemia 06/19/2022   Type 2 diabetes mellitus with hyperosmolar nonketotic hyperglycemia ([place]) 05/27/2022   Acute kidney failure ([place]) 03/17/2022   Mixed diabetic hyperlipidemia associated with type 2 diabetes mellitus ([place]) 03/16/2022   Chronic diastolic CHF (congestive heart failure) ([place]) 03/16/2022   Decubitus ulcer of left buttock, stage 2 ([place]) 03/16/2022   Cellulitis of left hand 11/30/2021   Closed fracture of part of upper end  of humerus 11/30/2021   At risk for falls 11/28/2021   Neoplasm of meninges 11/28/2021   Sepsis ([place]) 11/28/2021   Acute on chronic anemia 11/28/2021   Cerebral thrombosis with cerebral infarction 08/28/2021   Cerebrovascular accident (CVA) due to embolism of cerebral artery ([place])     Pressure injury of skin 08/26/2021   Acute respiratory failure with hypoxia ([place]) 85/63/1497   Acute metabolic encephalopathy 02/63/7858   Malignant hypertension 08/25/2021   Thrombocytopenia ([place]) 08/25/2021   Anemia due to chronic kidney disease 08/25/2021   Blurry vision, bilateral 08/16/2021   Hyponatremia 04/01/2021   COVID-19 03/30/2021   Senile osteoporosis 02/10/2021   Macrocytic anemia    E coli enteritis 01/09/2021   Acute on chronic pancreatitis ([place]) 01/08/2021   Acute renal failure superimposed on stage 4 chronic kidney disease ([place]) 01/07/2021   Diarrhea    Acute hypokalemia 01/06/2021   Long-term use of immunosuppressant medication-anticipated 11/28/2020   Elevated blood-pressure reading, without diagnosis of hypertension 05/30/2020   Meningioma ([place]) 05/30/2020   Headache 05/17/2020   Hypokalemia 05/17/2020   Hypomagnesemia 05/17/2020   Dehydration 05/17/2020   Hypertensive urgency 05/17/2020   Visual disturbance 05/17/2020   Left hip pain 09/22/2019   Lumbar radiculopathy 09/22/2019   Postoperative anemia due to acute blood loss 09/18/2019   Postoperative urinary retention 09/18/2019   Closed fracture of distal end of radius 05/26/2019   Coronary arteriosclerosis 07/15/2018   Closed right hip fracture ([place]) 04/08/2018   Elevated lipase    Crohn's disease of small intestine with other complication ([place])    Protein-calorie malnutrition, severe 03/05/2018   Neck pain 03/12/2017   Acute kidney injury superimposed on CKD ([place]) 01/25/2016   Neuropathy 03/02/2015   Pain in the chest    Chest pain 12/22/2014   Essential hypertension 12/22/2014   Nausea & vomiting 12/22/2014   Uncontrolled type 2 diabetes mellitus with hypoglycemia, with long-term current use of insulin ([place]) 12/22/2014   Hiatal hernia 12/22/2014   GERD (gastroesophageal reflux disease) 12/22/2014   Gout 12/22/2014   Mixed hyperlipidemia 12/22/2014   Depression 12/22/2014   Type 2 diabetes  mellitus without complications ([place]) 85/11/7739   Fusion of spine, cervicothoracic region 11/01/2014   Pseudophakia, left eye 10/20/2014   Chronic kidney disease (CKD), stage IV (severe) ([place]) 08/25/2014   Cervical stenosis of spine 08/26/2013   Vitamin B12 deficiency 07/08/2011   Personal history of failed moderate sedation 04/16/2011   Chronic Nausea and Vomiting - ? gastroapresis 04/16/2011   Early satiety 04/16/2011   Irritable bowel syndrome 04/16/2011   Benign paroxysmal positional vertigo 11/20/2010   TRICUSPID REGURGITATION, MODERATE 11/20/2010   PULMONARY HYPERTENSION, MILD 11/20/2010   LEG EDEMA, BILATERAL 11/20/2010   DYSPNEA 11/20/2010   NONSPECIFIC ABNORMAL ELECTROCARDIOGRAM 11/20/2010   CHEST WALL PAIN, HX OF 11/20/2010   PCP:  [name], MD Pharmacy:   [facility] [facility], [place] [address] [place] [address] Phone: [phone] Fax: [facility] [place], [place] - [address] [place] [address] Phone: [phone] Fax: [phone]     Social Determinants of Health (SDOH) Interventions    Readmission Risk Interventions    07/05/2022   11:42 AM 06/20/2022    2:55 PM 03/16/2022    5:01 PM  Readmission Risk Prevention Plan  Transportation Screening Complete Complete Complete  HRI or Home Care Consult   Complete  Social Work Consult for [place] Planning/Counseling   Complete  Palliative Care Screening   Not Applicable  Medication Review (RN Care Manager) Complete Complete Complete  PCP or Specialist appointment within 3-5 days of discharge  Not Complete   HRI or [place] Complete    SW Recovery Care/Counseling Consult Complete Complete   Palliative Care Screening Not Applicable Not [place] Not Applicable Not Applicable

## 2022-07-05 NOTE — Evaluation (Signed)
Physical Therapy Evaluation Patient Details Name: Heather [name] MRN: [id number] DOB: 08/12/1951 Today's Date: 07/05/2022  History of Present Illness  Heather [name] is a 71 y.o. female with medical history significant of hypertension, hyperlipidemia, T2DM, CKD stage IV, Crohn's disease, chronic pancreatitis, anxiety, stroke, IBS with diarrhea who presents to the emergency department due to 3-day onset of left flank pain with radiation of pain to left upper quadrant and epigastric area.  Pain was knifelike, it was constant, though the pain severity range from 10/10 (maximum) to 7-8/10 (minimal).  Abdominal pain was associated with nausea and vomiting.  Movement worsens the pain and pain medication given in the ED alleviates the pain.  She also complained of unwitnessed falls (3 times) since last 3 days due to weakness.  She endorsed double vision which was chronic, but denies chest pain, shortness of breath, fever, chills, headache.   Clinical Impression  Patient demonstrates slightly labored movement for sitting up at bedside, good return for transferring to/from commode in bathroom and to chair, but declined to ambulate outside of room due to c/o back pain.  Patient tolerated sitting up in chair after therapy.  Patient will benefit from continued skilled physical therapy in hospital and recommended venue below to increase strength, balance, endurance for safe ADLs and gait.         Recommendations for follow up therapy are one component of a multi-disciplinary discharge planning process, led by the attending physician.  Recommendations may be updated based on patient status, additional functional criteria and insurance authorization.  Follow Up Recommendations No PT follow up      Assistance Recommended at Discharge PRN  Patient can return home with the following  A little help with walking and/or transfers;A little help with bathing/dressing/bathroom;Help with stairs or ramp for  entrance;Assistance with cooking/housework    Equipment Recommendations None recommended by PT  Recommendations for Other Services       Functional Status Assessment Patient has had a recent decline in their functional status and demonstrates the ability to make significant improvements in function in a reasonable and predictable amount of time.     Precautions / Restrictions Precautions Precautions: Fall Restrictions Weight Bearing Restrictions: No Other Position/Activity Restrictions: Pt reported not bearing weight through R UE.      Mobility  Bed Mobility Overal bed mobility: Modified Independent                  Transfers Overall transfer level: Modified independent                 General transfer comment: slightly labored movement    Ambulation/Gait Ambulation/Gait assistance: Supervision Gait Distance (Feet): 15 Feet Assistive device: None Gait Pattern/deviations: Decreased step length - right, Decreased step length - left, Decreased stride length Gait velocity: decreased     General Gait Details: limited to taking steps in room due to c/o back pain and fatigue  Stairs            Wheelchair Mobility    Modified Rankin (Stroke Patients Only)       Balance Overall balance assessment: Mild deficits observed, not formally tested, Needs assistance Sitting-balance support: Feet supported, No upper extremity supported Sitting balance-Leahy Scale: Good Sitting balance - Comments: seated at EOB   Standing balance support: During functional activity, No upper extremity supported Standing balance-Leahy Scale: Fair  Pertinent Vitals/Pain Pain Assessment Pain Assessment: 0-10 Pain Score: 10-Worst pain ever Pain Location: L back and round stomach Pain Descriptors / Indicators: Sharp Pain Intervention(s): Limited activity within patient's tolerance, Monitored during session, Repositioned    Home  Living Family/patient expects to be discharged to:: Private residence Living Arrangements: Spouse/significant other Available Help at Discharge: Family;Available 24 hours/day Type of Home: House Home Access: Ramped entrance       Home Layout: One level Home Equipment: Rollator (4 wheels);Crutches;Tub bench;Grab bars - tub/shower;Rolling Walker (2 wheels);Cane - quad Additional Comments: Pt reported no change in previous home living from recent admission.    Prior Function Prior Level of Function : Independent/Modified Independent             Mobility Comments: patient states short distance community ambulation without AD ADLs Comments: states independent with basic ADL; family assists with IADL's     Hand Dominance   Dominant Hand: Right    Extremity/Trunk Assessment   Upper Extremity Assessment Upper Extremity Assessment: Defer to OT evaluation RUE Deficits / Details: Limited to <50% A/ROM of shoulder flexion. No P/ROM attempted due to hural head fractrue.    Lower Extremity Assessment Lower Extremity Assessment: Overall WFL for tasks assessed    Cervical / Trunk Assessment Cervical / Trunk Assessment: Normal  Communication   Communication: No difficulties  Cognition Arousal/Alertness: Awake/alert Behavior During Therapy: WFL for tasks assessed/performed Overall Cognitive Status: Within Functional Limits for tasks assessed                                          General Comments      Exercises     Assessment/Plan    PT Assessment Patient needs continued PT services  PT Problem List Decreased strength;Decreased activity tolerance;Decreased balance;Decreased mobility       PT Treatment Interventions DME instruction;Gait training;Stair training;Functional mobility training;Therapeutic activities;Therapeutic exercise;Patient/family education;Balance training    PT Goals (Current goals can be found in the Care Plan section)  Acute Rehab PT  Goals Patient Stated Goal: return home with family to assist PT Goal Formulation: With patient Time For Goal Achievement: 07/09/22 Potential to Achieve Goals: Good    Frequency Min 2X/week     Co-evaluation PT/OT/SLP Co-Evaluation/Treatment: Yes Reason for Co-Treatment: To address functional/ADL transfers PT goals addressed during session: Mobility/safety with mobility;Balance;Proper use of DME OT goals addressed during session: ADL's and self-care       AM-PAC PT "6 Clicks" Mobility  Outcome Measure Help needed turning from your back to your side while in a flat bed without using bedrails?: None Help needed moving from lying on your back to sitting on the side of a flat bed without using bedrails?: A Little Help needed moving to and from a bed to a chair (including a wheelchair)?: A Little Help needed standing up from a chair using your arms (e.g., wheelchair or bedside chair)?: A Little Help needed to walk in hospital room?: A Little Help needed climbing 3-5 steps with a railing? : A Little 6 Click Score: 19    End of Session   Activity Tolerance: Patient tolerated treatment well;Patient limited by fatigue Patient left: in chair;with call bell/phone within reach Nurse Communication: Mobility status PT Visit Diagnosis: Unsteadiness on feet (R26.81);Other abnormalities of gait and mobility (R26.89);Muscle weakness (generalized) (M62.81)    Time: 0973-5329 PT Time Calculation (min) (ACUTE ONLY): 20 min   Charges:  PT Evaluation $PT Eval Moderate Complexity: 1 Mod PT Treatments $Therapeutic Activity: 8-22 mins        11:28 AM, 07/05/22 Lonell Grandchild, MPT Physical Therapist with [facility] [phone] office [phone] mobile phone

## 2022-07-05 NOTE — Evaluation (Signed)
Occupational Therapy Evaluation Patient Details Name: Heather [name] MRN: [id number] DOB: 11/22/1950 Today's Date: 07/05/2022   History of Present Illness Heather [name] is a 71 y.o. female with medical history significant of hypertension, hyperlipidemia, T2DM, CKD stage IV, Crohn's disease, chronic pancreatitis, anxiety, stroke, IBS with diarrhea who presents to the emergency department due to 3-day onset of left flank pain with radiation of pain to left upper quadrant and epigastric area.  Pain was knifelike, it was constant, though the pain severity range from 10/10 (maximum) to 7-8/10 (minimal).  Abdominal pain was associated with nausea and vomiting.  Movement worsens the pain and pain medication given in the ED alleviates the pain.  She also complained of unwitnessed falls (3 times) since last 3 days due to weakness.  She endorsed double vision which was chronic, but denies chest pain, shortness of breath, fever, chills, headache. (Per DO)   Clinical Impression   Pt agreeable to OT and PT co-evaluation. Pt has limited  R UE A/ROM due to humeral head fracture. Pt has a plan for surgery once labs are within appropriate ranges. Pt was able to ambulate in room without assist and mild labored movement. Pt is recommended to continue plan regarding humoral head fracture with therapy to follow if needed per judgement of operating surgeon's recommendation. Pt is not recommended for further acute OT services and will be discharged to care of nursing staff for remaining length of stay.      Recommendations for follow up therapy are one component of a multi-disciplinary discharge planning process, led by the attending physician.  Recommendations may be updated based on patient status, additional functional criteria and insurance authorization.   Follow Up Recommendations  Follow physician's recommendations for discharge plan and follow up therapies (after surgery)    Assistance Recommended at  Discharge None  Patient can return home with the following      Functional Status Assessment  Patient has not had a recent decline in their functional status  Equipment Recommendations  None recommended by OT    Recommendations for Other Services Other (comment) (Follow up with OT after humeral head surgery. Continue to follow up with MD about double vision issues.)     Precautions / Restrictions Precautions Precautions: Fall Restrictions Weight Bearing Restrictions: No Other Position/Activity Restrictions: Pt reported not bearing weight through R UE.      Mobility Bed Mobility Overal bed mobility: Modified Independent Bed Mobility: Supine to Sit     Supine to sit: Modified independent (Device/Increase time)     General bed mobility comments: HOB elevated; supine to sit without assist.    Transfers Overall transfer level: Modified independent                 General transfer comment: Able to ambulate to toilet from EOB without assist.      Balance Overall balance assessment: Mild deficits observed, not formally tested                                         ADL either performed or assessed with clinical judgement   ADL Overall ADL's : Modified independent     Grooming: Modified independent;Sitting;Wash/dry hands Grooming Details (indicate cue type and reason): Washing hands standing at sink.             Lower Body Dressing: Modified independent;Sitting/lateral leans Lower Body Dressing Details (indicate cue type  and reason): Donning sandals at [employer] Transfer: Modified Independent;Ambulation           Functional mobility during ADLs: Modified independent       Vision Baseline Vision/History: 1 Wears glasses Ability to See in Adequate Light: 2 Moderately impaired Patient Visual Report: Diplopia Vision Assessment?: Yes Tracking/Visual Pursuits: Able to track stimulus in all quads without difficulty Additional  Comments: Pt reports seeing double vision currently and consistently for the last 2 weeks.                Pertinent Vitals/Pain Pain Assessment Pain Assessment: 0-10 Pain Score: 10-Worst pain ever Pain Location: L back and round stomach Pain Descriptors / Indicators: Other (Comment) ("like a knife")     Hand Dominance Right   Extremity/Trunk Assessment Upper Extremity Assessment Upper Extremity Assessment: Generalized weakness;RUE deficits/detail RUE Deficits / Details: Limited to <50% A/ROM of shoulder flexion. No P/ROM attempted due to hural head fractrue.   Lower Extremity Assessment Lower Extremity Assessment: Defer to PT evaluation   Cervical / Trunk Assessment Cervical / Trunk Assessment: Normal   Communication Communication Communication: No difficulties   Cognition Arousal/Alertness: Awake/alert Behavior During Therapy: WFL for tasks assessed/performed Overall Cognitive Status: Within Functional Limits for tasks assessed                                                        Home Living Family/patient expects to be discharged to:: Private residence Living Arrangements: Spouse/significant other Available Help at Discharge: Family;Available 24 hours/day Type of Home: House Home Access: Ramped entrance     Home Layout: One level     Bathroom Shower/Tub: Teacher, early years/pre: Handicapped height Bathroom Accessibility: Yes How Accessible: Accessible via walker Home Equipment: Rollator (4 wheels);Crutches;Tub bench;Grab bars - tub/shower;Rolling Walker (2 wheels);Cane - quad   Additional Comments: Pt reported no change in previous home living from recent admission.      Prior Functioning/Environment Prior Level of Function : Independent/Modified Independent             Mobility Comments: patient states short distance community ambulation without AD ADLs Comments: states independent with basic ADL; family assists  with IADL's                                Co-evaluation PT/OT/SLP Co-Evaluation/Treatment: Yes Reason for Co-Treatment: To address functional/ADL transfers   OT goals addressed during session: ADL's and self-care                       End of Session    Activity Tolerance: Patient tolerated treatment well Patient left: in chair;with call bell/phone within reach  OT Visit Diagnosis: Muscle weakness (generalized) (M62.81);Low vision, both eyes (H54.2)                Time: 0086-7619 OT Time Calculation (min): 15 min Charges:  OT General Charges $OT Visit: 1 Visit OT Evaluation $OT Eval Low Complexity: 1 Low  Lashonta Pilling OT, MOT  Larey Seat 07/05/2022, 9:26 AM

## 2022-07-05 NOTE — Plan of Care (Signed)
  Problem: Acute Rehab PT Goals(only PT should resolve) Goal: Pt Will Go Supine/Side To Sit Outcome: Progressing Flowsheets (Taken 07/05/2022 1130) Pt will go Supine/Side to Sit:  Independently  with modified independence Goal: Patient Will Transfer Sit To/From Stand Outcome: Progressing Flowsheets (Taken 07/05/2022 1130) Patient will transfer sit to/from stand:  with modified independence  Independently Goal: Pt Will Transfer Bed To Chair/Chair To Bed Outcome: Progressing Flowsheets (Taken 07/05/2022 1130) Pt will Transfer Bed to Chair/Chair to Bed:  with modified independence  Independently Goal: Pt Will Ambulate Outcome: Progressing Flowsheets (Taken 07/05/2022 1130) Pt will Ambulate:  50 feet  with modified independence  with cane   11:30 AM, 07/05/22 Lonell Grandchild, MPT Physical Therapist with [facility] [phone] office [phone] mobile phone

## 2022-07-06 DIAGNOSIS — K853 Drug induced acute pancreatitis without necrosis or infection: Secondary | ICD-10-CM

## 2022-07-06 LAB — GLUCOSE, CAPILLARY
Glucose-Capillary: 199 mg/dL — ABNORMAL HIGH (ref 70–99)
Glucose-Capillary: 166 mg/dL — ABNORMAL HIGH (ref 70–99)
Glucose-Capillary: 186 mg/dL — ABNORMAL HIGH (ref 70–99)
Glucose-Capillary: 282 mg/dL — ABNORMAL HIGH (ref 70–99)

## 2022-07-06 LAB — CBC
HCT: 25.5 % — ABNORMAL LOW (ref 36.0–46.0)
Hemoglobin: 8.3 g/dL — ABNORMAL LOW (ref 12.0–15.0)
MCH: 32 pg (ref 26.0–34.0)
MCHC: 32.5 g/dL (ref 30.0–36.0)
MCV: 98.5 fL (ref 80.0–100.0)
Platelets: 158 10*3/uL (ref 150–400)
RBC: 2.59 MIL/uL — ABNORMAL LOW (ref 3.87–5.11)
RDW: 14.5 % (ref 11.5–15.5)
WBC: 5.4 10*3/uL (ref 4.0–10.5)
nRBC: 0 % (ref 0.0–0.2)

## 2022-07-06 LAB — COMPREHENSIVE METABOLIC PANEL
ALT: 15 U/L (ref 0–44)
AST: 15 U/L (ref 15–41)
Albumin: 2.9 g/dL — ABNORMAL LOW (ref 3.5–5.0)
Alkaline Phosphatase: 102 U/L (ref 38–126)
Anion gap: 9 (ref 5–15)
BUN: 41 mg/dL — ABNORMAL HIGH (ref 8–23)
CO2: 20 mmol/L — ABNORMAL LOW (ref 22–32)
Calcium: 9.1 mg/dL (ref 8.9–10.3)
Chloride: 100 mmol/L (ref 98–111)
Creatinine, Ser: 2.72 mg/dL — ABNORMAL HIGH (ref 0.44–1.00)
GFR, Estimated: 18 mL/min — ABNORMAL LOW (ref >60)
Glucose, Bld: 253 mg/dL — ABNORMAL HIGH (ref 70–99)
Potassium: 4.5 mmol/L (ref 3.5–5.1)
Sodium: 129 mmol/L — ABNORMAL LOW (ref 135–145)
Total Bilirubin: 0.7 mg/dL (ref 0.3–1.2)
Total Protein: 6.7 g/dL (ref 6.5–8.1)

## 2022-07-06 LAB — LIPASE, BLOOD: Lipase: 65 U/L — ABNORMAL HIGH (ref 11–51)

## 2022-07-06 MED ORDER — DIPHENHYDRAMINE HCL 2 % EX CREA: TOPICAL_CREAM | Freq: Three times a day (TID) | CUTANEOUS | Status: DC | PRN

## 2022-07-06 MED ORDER — SODIUM CHLORIDE 0.9 % IV SOLN: INTRAVENOUS | Status: DC

## 2022-07-06 MED ORDER — DIPHENHYDRAMINE-ZINC ACETATE 2-0.1 % EX CREA: TOPICAL_CREAM | Freq: Three times a day (TID) | CUTANEOUS | Status: DC | PRN

## 2022-07-06 MED ORDER — BUSPIRONE HCL 5 MG PO TABS
10.0000 mg | ORAL_TABLET | Freq: Three times a day (TID) | ORAL | Status: DC | PRN
  Administered 2022-07-06: 10 mg via ORAL
  Filled 2022-07-06: qty 2

## 2022-07-06 MED ORDER — ALUM & MAG HYDROXIDE-SIMETH 200-200-20 MG/5ML PO SUSP: 15.0000 mL | ORAL | Status: DC | PRN

## 2022-07-06 MED ORDER — LEVETIRACETAM 500 MG PO TABS
500.0000 mg | ORAL_TABLET | Freq: Two times a day (BID) | ORAL | Status: DC
  Administered 2022-07-06 – 2022-07-07 (×3): 500 mg via ORAL
  Filled 2022-07-06 (×3): qty 1

## 2022-07-06 MED ORDER — CALCIUM CARBONATE ANTACID 500 MG PO CHEW
400.0000 mg | CHEWABLE_TABLET | Freq: Three times a day (TID) | ORAL | Status: DC
  Administered 2022-07-06 – 2022-07-07 (×4): 400 mg via ORAL
  Filled 2022-07-06 (×4): qty 2

## 2022-07-06 MED ORDER — ZIPRASIDONE HCL 20 MG PO CAPS
20.0000 mg | ORAL_CAPSULE | Freq: Two times a day (BID) | ORAL | Status: DC
  Administered 2022-07-06 – 2022-07-07 (×3): 20 mg via ORAL
  Filled 2022-07-06 (×7): qty 1

## 2022-07-06 NOTE — Progress Notes (Signed)
PT Cancellation Note  Patient Details Name: Heather [name] MRN: [id number] DOB: September 11, 1951   Cancelled Treatment:    Reason Eval/Treat Not Completed: Patient declined, no reason specified.  Patient declined therapy secondary to not wanting to aggravate abdominal pain and states she has been walking to bathroom.   3:35 PM, 07/06/22 Lonell Grandchild, MPT Physical Therapist with [facility] [phone] office [phone] mobile phone

## 2022-07-06 NOTE — Progress Notes (Signed)
PROGRESS NOTE    Heather [name]  OFB:[id number] DOB: 1951/08/06 DOA: 07/04/2022 PCP: [name], MD   Brief Narrative:     Heather [name] is a 71 y.o. female with medical history significant of hypertension, hyperlipidemia, T2DM, CKD stage IV, Crohn's disease, chronic pancreatitis, anxiety, stroke, IBS with diarrhea who presents to the emergency department due to 3-day onset of left flank pain with radiation of pain to left upper quadrant and epigastric area.  She was admitted for recurrent acute on chronic pancreatitis and was just discharged for the same on 8/25.  GI consulted for further recommendations.  Assessment & Plan:   Principal Problem:   Acute pancreatitis Active Problems:   Chronic kidney disease (CKD), stage IV (severe) (HCC)   Nausea & vomiting   GERD (gastroesophageal reflux disease)   Crohn's disease of small intestine without complication (HCC)   Hypomagnesemia   Visual disturbance   Hyponatremia   Multiple falls   Humeral head fracture  Assessment and Plan:   Acute on chronic pancreatitis Nausea and vomiting slowly improving Continue IV Zofran p.r.n Continue IV morphine p.r.n for pain Continue Protonix Continue IV NS at 142m/Hr Continue soft diet with indigestion noted MRCP with no significant findings Lipid panel recently performed with no hypertriglyceridemia   Falls at home Continue fall precaution and neurochecks PT/OT with no further follow-up recommended   Right humeral head and neck fracture Patient follows with an orthopedic surgeon and has an impending surgical procedure pending better control of blood glucose level     Hyponatremia possibly due to dehydration Improving, continue to monitor with IV hydration switch back to normal saline   Hypoalbuminemia possibly secondary to moderate protein calorie malnutrition Albumin 2.8, protein supplement to be provided   CKD stage IV Creatinine 2.68 (creatinine is within baseline  range). Renally adjust medications, avoid nephrotoxic agents/dehydration/hypotension     T2DM with uncontrolled hyperglycemia Continue Levemir 10 units nightly Continue ISS and hypoglycemia protocol   Essential hypertension Continue hydralazine and metoprolol   Mixed hyperlipidemia Continue Lipitor   GERD Continue Protonix   IBS Continue Bentyl   Visual disturbance MRI done on 8/23 showed 6 mm meningioma overlying the anterior right frontal lobe   Crohn's disease Stable     DVT prophylaxis: Heparin Code Status: Full Family Communication: None at bedside Disposition Plan:  Status is: Inpatient Remains inpatient appropriate because: Need for IV medications and fluids       Skin Assessment:   I have examined the patient's skin and I agree with the wound assessment as performed by the wound care RN as outlined below:       Pressure Injury 08/26/21 Buttocks Right;Left Stage 1 -  Intact skin with non-blanchable redness of a localized area usually over a bony prominence. (Active)  08/26/21 0050  Location: Buttocks  Location Orientation: Right;Left  Staging: Stage 1 -  Intact skin with non-blanchable redness of a localized area usually over a bony prominence.  Wound Description (Comments):   Present on Admission: Yes     Pressure Injury 03/16/22 Buttocks Left;Medial Stage 2 -  Partial thickness loss of dermis presenting as a shallow open injury with a red, pink wound bed without slough. pink wound bed, perimeter reddened and purple (Active)  03/16/22 0015  Location: Buttocks  Location Orientation: Left;Medial  Staging: Stage 2 -  Partial thickness loss of dermis presenting as a shallow open injury with a red, pink wound bed without slough.  Wound Description (Comments): pink wound bed, perimeter  reddened and purple  Present on Admission: Yes  Dressing Type Foam - Lift dressing to assess site every shift 06/20/22 0855     Pressure Injury 06/21/22 Coccyx Right Stage 1  -  Intact skin with non-blanchable redness of a localized area usually over a bony prominence. red circular 0.5cm pressure injury directly on boney sacrum (Active)  06/21/22 1000  Location: Coccyx  Location Orientation: Right  Staging: Stage 1 -  Intact skin with non-blanchable redness of a localized area usually over a bony prominence.  Wound Description (Comments): red circular 0.5cm pressure injury directly on boney sacrum  Present on Admission:   Dressing Type Foam - Lift dressing to assess site every shift 06/21/22 1816      Consultants:  GI   Procedures:  None   Antimicrobials:  None    Subjective: Patient seen and evaluated today with ongoing abdominal pain and indigestion noted with mild nausea.  She states she had 1 episode of vomiting this morning.  Barely tolerating diet.  Objective: Vitals:   07/06/22 0822 07/06/22 0957 07/06/22 1100 07/06/22 1300  BP: (!) 195/64 (!) 178/65  128/65  Pulse:  100  97  Resp:      Temp:    98.2 F (36.8 C)  TempSrc:    Oral  SpO2:   100% 98%  Weight:   53.9 kg   Height:   '5\' 3"'$  (1.6 m)     Intake/Output Summary (Last 24 hours) at 07/06/2022 1424 Last data filed at 07/06/2022 0641 Gross per 24 hour  Intake 2377.58 ml  Output --  Net 2377.58 ml   Filed Weights   07/04/22 1323 07/04/22 2138 07/06/22 1100  Weight: 53.1 kg 53.9 kg 53.9 kg    Examination:  General exam: Appears calm and comfortable  Respiratory system: Clear to auscultation. Respiratory effort normal. Cardiovascular system: S1 & S2 heard, RRR.  Gastrointestinal system: Abdomen is soft Central nervous system: Alert and awake Extremities: No edema Skin: No significant lesions noted Psychiatry: Flat affect.    Data Reviewed: I have personally reviewed following labs and imaging studies  CBC: Recent Labs  Lab 07/04/22 1516 07/04/22 2147 07/05/22 0342 07/06/22 0334  WBC 9.0 11.0* 9.5 5.4  HGB 7.7* 8.0* 8.0* 8.3*  HCT 23.2* 23.8* 24.3* 25.5*  MCV 96.7  96.4 96.0 98.5  PLT 151 183 155 992   Basic Metabolic Panel: Recent Labs  Lab 07/04/22 1516 07/04/22 2147 07/05/22 0342 07/06/22 0334  NA 126*  --  131* 129*  K 5.0  --  4.4 4.5  CL 96*  --  103 100  CO2 19*  --  21* 20*  GLUCOSE 518*  --  131* 253*  BUN 47*  --  46* 41*  CREATININE 2.68* 2.70* 2.59* 2.72*  CALCIUM 8.7*  --  9.0 9.1  MG 1.3*  --  1.9  --   PHOS  --   --  4.1  --    GFR: Estimated Creatinine Clearance: 16.1 mL/min (A) (by C-G formula based on SCr of 2.72 mg/dL (H)). Liver Function Tests: Recent Labs  Lab 07/04/22 1827 07/05/22 0342 07/06/22 0334  AST 13* 15 15  ALT '16 15 15  '$ ALKPHOS 96 93 102  BILITOT 0.6 0.7 0.7  PROT 6.6 6.5 6.7  ALBUMIN 2.8* 2.8* 2.9*   Recent Labs  Lab 07/04/22 1827 07/06/22 0334  LIPASE 86* 65*   No results for input(s): "AMMONIA" in the last 168 hours. Coagulation Profile: No results for input(s): "  INR", "PROTIME" in the last 168 hours. Cardiac Enzymes: No results for input(s): "CKTOTAL", "CKMB", "CKMBINDEX", "TROPONINI" in the last 168 hours. BNP (last 3 results) No results for input(s): "PROBNP" in the last 8760 hours. HbA1C: No results for input(s): "HGBA1C" in the last 72 hours. CBG: Recent Labs  Lab 07/05/22 1603 07/05/22 2147 07/05/22 2245 07/06/22 0712 07/06/22 1110  GLUCAP 183* 177* 177* 199* 166*   Lipid Profile: No results for input(s): "CHOL", "HDL", "LDLCALC", "TRIG", "CHOLHDL", "LDLDIRECT" in the last 72 hours. Thyroid Function Tests: No results for input(s): "TSH", "T4TOTAL", "FREET4", "T3FREE", "THYROIDAB" in the last 72 hours. Anemia Panel: No results for input(s): "VITAMINB12", "FOLATE", "FERRITIN", "TIBC", "IRON", "RETICCTPCT" in the last 72 hours. Sepsis Labs: No results for input(s): "PROCALCITON", "LATICACIDVEN" in the last 168 hours.  No results found for this or any previous visit (from the past 240 hour(s)).       Radiology Studies: MR ABDOMEN MRCP WO CONTRAST  Result Date:  07/05/2022 CLINICAL DATA:  Abdominal pain and vomiting.  Acute pancreatitis. EXAM: MRI ABDOMEN WITHOUT CONTRAST  (INCLUDING MRCP) TECHNIQUE: Multiplanar multisequence MR imaging of the abdomen was performed. Heavily T2-weighted images of the biliary and pancreatic ducts were obtained, and three-dimensional MRCP images were rendered by post processing. COMPARISON:  Noncontrast CT on 07/04/2022 FINDINGS: Lower chest: No acute findings. Hepatobiliary: No masses visualized on this unenhanced exam. Diffusely decreased hepatic T2 signal intensity, consistent with hemosiderosis. Prior cholecystectomy is noted. No evidence of biliary ductal dilatation, with common bile duct measuring 4 mm. No evidence of choledocholithiasis. Pancreas: No evidence of pancreatic mass or ductal dilatation. No evidence of pancreas divisum. Mild to moderate peripancreatic inflammatory changes are seen. A small peripancreatic fluid collection is seen along the posterior and superior aspect of the pancreatic tail measuring 2.0 by 1.4 cm, consistent with a small pseudocyst. Spleen: Within normal limits in size. No splenic lesions identified. Diffusely decreased T2 signal intensity is consistent with transfusional siderosis. Adrenals/Urinary tract: Unremarkable. No evidence of mass or hydronephrosis. Stomach/Bowel: Unremarkable. Vascular/Lymphatic: No pathologically enlarged lymph nodes identified. No evidence of abdominal aortic aneurysm. Other:  None. Musculoskeletal:  No suspicious bone lesions identified. IMPRESSION: Mild to moderate acute pancreatitis, with 2 cm pseudocyst adjacent the pancreatic tail. No evidence of pancreatic ductal dilatation or pancreas divisum. Prior cholecystectomy. No evidence of biliary ductal dilatation or choledocholithiasis. Transfusional siderosis. Electronically Signed   By: Marlaine Hind M.D.   On: 07/05/2022 11:39   MR 3D Recon At Scanner  Result Date: 07/05/2022 CLINICAL DATA:  Abdominal pain and vomiting.   Acute pancreatitis. EXAM: MRI ABDOMEN WITHOUT CONTRAST  (INCLUDING MRCP) TECHNIQUE: Multiplanar multisequence MR imaging of the abdomen was performed. Heavily T2-weighted images of the biliary and pancreatic ducts were obtained, and three-dimensional MRCP images were rendered by post processing. COMPARISON:  Noncontrast CT on 07/04/2022 FINDINGS: Lower chest: No acute findings. Hepatobiliary: No masses visualized on this unenhanced exam. Diffusely decreased hepatic T2 signal intensity, consistent with hemosiderosis. Prior cholecystectomy is noted. No evidence of biliary ductal dilatation, with common bile duct measuring 4 mm. No evidence of choledocholithiasis. Pancreas: No evidence of pancreatic mass or ductal dilatation. No evidence of pancreas divisum. Mild to moderate peripancreatic inflammatory changes are seen. A small peripancreatic fluid collection is seen along the posterior and superior aspect of the pancreatic tail measuring 2.0 by 1.4 cm, consistent with a small pseudocyst. Spleen: Within normal limits in size. No splenic lesions identified. Diffusely decreased T2 signal intensity is consistent with transfusional siderosis. Adrenals/Urinary tract: Unremarkable.  No evidence of mass or hydronephrosis. Stomach/Bowel: Unremarkable. Vascular/Lymphatic: No pathologically enlarged lymph nodes identified. No evidence of abdominal aortic aneurysm. Other:  None. Musculoskeletal:  No suspicious bone lesions identified. IMPRESSION: Mild to moderate acute pancreatitis, with 2 cm pseudocyst adjacent the pancreatic tail. No evidence of pancreatic ductal dilatation or pancreas divisum. Prior cholecystectomy. No evidence of biliary ductal dilatation or choledocholithiasis. Transfusional siderosis. Electronically Signed   By: Marlaine Hind M.D.   On: 07/05/2022 11:39   US Abdomen Limited  Result Date: 07/05/2022 CLINICAL DATA:  Abdominal pain EXAM: ULTRASOUND ABDOMEN LIMITED RIGHT UPPER QUADRANT COMPARISON:  CT abdomen  pelvis 07/04/2022 FINDINGS: Gallbladder: Status post cholecystectomy Common bile duct: Diameter: 5 mm Liver: Parenchymal echogenicity: Within normal limits Contours: Normal Lesions: None Portal vein: Limited visualization. Other: None. IMPRESSION: 1. No significant sonographic abnormality of the liver. 2. Limited assessment of the portal vein. Electronically Signed   By: Miachel Roux M.D.   On: 07/05/2022 09:17   CT CHEST ABDOMEN PELVIS WO CONTRAST  Result Date: 07/04/2022 CLINICAL DATA:  Shortness of breath, chest pain and vomiting today. EXAM: CT CHEST, ABDOMEN AND PELVIS WITHOUT CONTRAST TECHNIQUE: Multidetector CT imaging of the chest, abdomen and pelvis was performed following the standard protocol without IV contrast. RADIATION DOSE REDUCTION: This exam was performed according to the departmental dose-optimization program which includes automated exposure control, adjustment of the mA and/or kV according to patient size and/or use of iterative reconstruction technique. COMPARISON:  Abdominal CT scan 06/19/2022 FINDINGS: CT CHEST FINDINGS Cardiovascular: The heart is normal in size. No pericardial effusion. The aorta is normal in caliber. Age advanced atherosclerotic calcification involving the aorta and coronary arteries. Mediastinum/Nodes: No mediastinal or hilar mass or adenopathy. The esophagus is grossly normal. Lungs/Pleura: No infiltrates, edema or effusions. No worrisome pulmonary lesions or pulmonary nodules. No interstitial lung disease or bronchiectasis. Musculoskeletal: There is a complex comminuted fragmented ununited right humeral head and neck fracture remote T12 compression fracture. CT ABDOMEN PELVIS FINDINGS Hepatobiliary: No obvious hepatic lesions or intrahepatic biliary dilatation. The gallbladder is surgically absent. No common bile duct dilatation. Pancreas: There is marked inflammation of and around the body and tail region of the pancreas suggesting acute pancreatitis. The head is  less affected. Spleen: The spleen is upper limits of normal in size and stable. Extensive calcification of the splenic artery noted. Adrenals/Urinary Tract: The adrenal glands and kidneys are grossly normal without contrast. A small right renal calculus is noted. The bladder is unremarkable. Stomach/Bowel: Stomach, duodenum, small bowel and colon are grossly normal. Moderate diffuse colonic diverticulosis without findings for acute diverticulitis. No findings for small bowel obstruction and free air. Vascular/Lymphatic: Advanced atherosclerotic calcification involving the aorta and branch vessels but no aneurysm. No mesenteric or retroperitoneal mass or adenopathy. Reproductive: Surgically absent. Other: No pelvic mass or adenopathy. No free pelvic fluid collections. No inguinal mass or adenopathy. No abdominal wall hernia or subcutaneous lesions. Musculoskeletal: Extensive lumbar fusion hardware. Prior right hip fracture fixation and evidence of prior pelvic fractures. No acute bony findings. IMPRESSION: 1. CT findings consistent with acute pancreatitis. 2. Age advanced atherosclerotic calcification involving the aorta and branch vessels including the coronary arteries. 3. No acute pulmonary findings or worrisome pulmonary lesions. 4. Complex comminuted ununited right humeral head and neck fracture. Aortic Atherosclerosis (ICD10-I70.0). Electronically Signed   By: Marijo Sanes M.D.   On: 07/04/2022 17:27        Scheduled Meds:  atorvastatin  80 mg Oral Daily   calcium carbonate  400  mg of elemental calcium Oral TID WC   dicyclomine  20 mg Oral BID   feeding supplement  1 Container Oral TID BM   feeding supplement (GLUCERNA SHAKE)  237 mL Oral TID BM   heparin  5,000 Units Subcutaneous Q8H   hydrALAZINE  50 mg Oral BID   insulin aspart  0-5 Units Subcutaneous QHS   insulin aspart  0-9 Units Subcutaneous TID WC   insulin detemir  10 Units Subcutaneous QHS   levETIRAcetam  500 mg Oral BID    metoprolol succinate  100 mg Oral Daily   pantoprazole (PROTONIX) IV  40 mg Intravenous Q24H   ziprasidone  20 mg Oral BID   Continuous Infusions:  sodium chloride 100 mL/hr at 07/06/22 0825     LOS: 2 days    Time spent: 35 minutes    Wyona Neils Darleen Crocker, DO [facility]  If 7PM-7AM, please contact night-coverage [email or web address] 07/06/2022, 2:24 PM

## 2022-07-06 NOTE — Progress Notes (Signed)
Gastroenterology Progress Note   Referring Provider: No ref. provider found Primary Care Physician:  [name], MD Primary Gastroenterologist:  Dr. Carlean Purl [facility]).   Patient ID: Heather [name]; [id number]; Aug 18, 1951   Subjective:    Doesn't feel well. Stomach and back hurt. Drank some liquids but didn't feel like eating diet.   Objective:   Vital signs in last 24 hours: Temp:  [98 F (36.7 C)-98.4 F (36.9 C)] 98.4 F (36.9 C) (09/08 0819) Pulse Rate:  [81-100] 100 (09/08 0957) Resp:  [16-18] 18 (09/08 0500) BP: (138-195)/(59-114) 178/65 (09/08 0957) SpO2:  [100 %] 100 % (09/08 1100) Weight:  [53.9 kg] 53.9 kg (09/08 1100) Last BM Date : 07/04/22 General:   Alert, pleasant and cooperative in NAD Head:  Normocephalic and atraumatic. Eyes:  Sclera clear, no icterus.  Abdomen:  Soft,  nondistended. Normal bowel sounds, without guarding, and without rebound.  Mild luq tenderness Extremities:  Without clubbing, deformity or edema. Neurologic:  Alert and  oriented x4;  grossly normal neurologically. Skin:  Intact without significant lesions or rashes. Psych:  Alert and cooperative. Normal mood and affect.  Intake/Output from previous day: 09/07 0701 - 09/08 0700 In: 2617.6 [P.O.:1200; I.V.:1417.6] Out: -  Intake/Output this shift: No intake/output data recorded.  Lab Results: CBC Recent Labs    07/04/22 2147 07/05/22 0342 07/06/22 0334  WBC 11.0* 9.5 5.4  HGB 8.0* 8.0* 8.3*  HCT 23.8* 24.3* 25.5*  MCV 96.4 96.0 98.5  PLT 183 155 158   BMET Recent Labs    07/04/22 1516 07/04/22 2147 07/05/22 0342 07/06/22 0334  NA 126*  --  131* 129*  K 5.0  --  4.4 4.5  CL 96*  --  103 100  CO2 19*  --  21* 20*  GLUCOSE 518*  --  131* 253*  BUN 47*  --  46* 41*  CREATININE 2.68* 2.70* 2.59* 2.72*  CALCIUM 8.7*  --  9.0 9.1   LFTs Recent Labs    07/04/22 1827 07/05/22 0342 07/06/22 0334  BILITOT 0.6 0.7 0.7  BILIDIR 0.1  --   --   IBILI 0.5  --    --   ALKPHOS 96 93 102  AST 13* 15 15  ALT '16 15 15  '$ PROT 6.6 6.5 6.7  ALBUMIN 2.8* 2.8* 2.9*   Recent Labs    07/04/22 1827 07/06/22 0334  LIPASE 86* 65*   PT/INR No results for input(s): "LABPROT", "INR" in the last 72 hours.       Imaging Studies: MR ABDOMEN MRCP WO CONTRAST  Result Date: 07/05/2022 CLINICAL DATA:  Abdominal pain and vomiting.  Acute pancreatitis. EXAM: MRI ABDOMEN WITHOUT CONTRAST  (INCLUDING MRCP) TECHNIQUE: Multiplanar multisequence MR imaging of the abdomen was performed. Heavily T2-weighted images of the biliary and pancreatic ducts were obtained, and three-dimensional MRCP images were rendered by post processing. COMPARISON:  Noncontrast CT on 07/04/2022 FINDINGS: Lower chest: No acute findings. Hepatobiliary: No masses visualized on this unenhanced exam. Diffusely decreased hepatic T2 signal intensity, consistent with hemosiderosis. Prior cholecystectomy is noted. No evidence of biliary ductal dilatation, with common bile duct measuring 4 mm. No evidence of choledocholithiasis. Pancreas: No evidence of pancreatic mass or ductal dilatation. No evidence of pancreas divisum. Mild to moderate peripancreatic inflammatory changes are seen. A small peripancreatic fluid collection is seen along the posterior and superior aspect of the pancreatic tail measuring 2.0 by 1.4 cm, consistent with a small pseudocyst. Spleen: Within normal limits in size. No  splenic lesions identified. Diffusely decreased T2 signal intensity is consistent with transfusional siderosis. Adrenals/Urinary tract: Unremarkable. No evidence of mass or hydronephrosis. Stomach/Bowel: Unremarkable. Vascular/Lymphatic: No pathologically enlarged lymph nodes identified. No evidence of abdominal aortic aneurysm. Other:  None. Musculoskeletal:  No suspicious bone lesions identified. IMPRESSION: Mild to moderate acute pancreatitis, with 2 cm pseudocyst adjacent the pancreatic tail. No evidence of pancreatic ductal  dilatation or pancreas divisum. Prior cholecystectomy. No evidence of biliary ductal dilatation or choledocholithiasis. Transfusional siderosis. Electronically Signed   By: Marlaine Hind M.D.   On: 07/05/2022 11:39   MR 3D Recon At Scanner  Result Date: 07/05/2022 CLINICAL DATA:  Abdominal pain and vomiting.  Acute pancreatitis. EXAM: MRI ABDOMEN WITHOUT CONTRAST  (INCLUDING MRCP) TECHNIQUE: Multiplanar multisequence MR imaging of the abdomen was performed. Heavily T2-weighted images of the biliary and pancreatic ducts were obtained, and three-dimensional MRCP images were rendered by post processing. COMPARISON:  Noncontrast CT on 07/04/2022 FINDINGS: Lower chest: No acute findings. Hepatobiliary: No masses visualized on this unenhanced exam. Diffusely decreased hepatic T2 signal intensity, consistent with hemosiderosis. Prior cholecystectomy is noted. No evidence of biliary ductal dilatation, with common bile duct measuring 4 mm. No evidence of choledocholithiasis. Pancreas: No evidence of pancreatic mass or ductal dilatation. No evidence of pancreas divisum. Mild to moderate peripancreatic inflammatory changes are seen. A small peripancreatic fluid collection is seen along the posterior and superior aspect of the pancreatic tail measuring 2.0 by 1.4 cm, consistent with a small pseudocyst. Spleen: Within normal limits in size. No splenic lesions identified. Diffusely decreased T2 signal intensity is consistent with transfusional siderosis. Adrenals/Urinary tract: Unremarkable. No evidence of mass or hydronephrosis. Stomach/Bowel: Unremarkable. Vascular/Lymphatic: No pathologically enlarged lymph nodes identified. No evidence of abdominal aortic aneurysm. Other:  None. Musculoskeletal:  No suspicious bone lesions identified. IMPRESSION: Mild to moderate acute pancreatitis, with 2 cm pseudocyst adjacent the pancreatic tail. No evidence of pancreatic ductal dilatation or pancreas divisum. Prior cholecystectomy. No  evidence of biliary ductal dilatation or choledocholithiasis. Transfusional siderosis. Electronically Signed   By: Marlaine Hind M.D.   On: 07/05/2022 11:39   US Abdomen Limited  Result Date: 07/05/2022 CLINICAL DATA:  Abdominal pain EXAM: ULTRASOUND ABDOMEN LIMITED RIGHT UPPER QUADRANT COMPARISON:  CT abdomen pelvis 07/04/2022 FINDINGS: Gallbladder: Status post cholecystectomy Common bile duct: Diameter: 5 mm Liver: Parenchymal echogenicity: Within normal limits Contours: Normal Lesions: None Portal vein: Limited visualization. Other: None. IMPRESSION: 1. No significant sonographic abnormality of the liver. 2. Limited assessment of the portal vein. Electronically Signed   By: Miachel Roux M.D.   On: 07/05/2022 09:17   CT CHEST ABDOMEN PELVIS WO CONTRAST  Result Date: 07/04/2022 CLINICAL DATA:  Shortness of breath, chest pain and vomiting today. EXAM: CT CHEST, ABDOMEN AND PELVIS WITHOUT CONTRAST TECHNIQUE: Multidetector CT imaging of the chest, abdomen and pelvis was performed following the standard protocol without IV contrast. RADIATION DOSE REDUCTION: This exam was performed according to the departmental dose-optimization program which includes automated exposure control, adjustment of the mA and/or kV according to patient size and/or use of iterative reconstruction technique. COMPARISON:  Abdominal CT scan 06/19/2022 FINDINGS: CT CHEST FINDINGS Cardiovascular: The heart is normal in size. No pericardial effusion. The aorta is normal in caliber. Age advanced atherosclerotic calcification involving the aorta and coronary arteries. Mediastinum/Nodes: No mediastinal or hilar mass or adenopathy. The esophagus is grossly normal. Lungs/Pleura: No infiltrates, edema or effusions. No worrisome pulmonary lesions or pulmonary nodules. No interstitial lung disease or bronchiectasis. Musculoskeletal: There is a complex  comminuted fragmented ununited right humeral head and neck fracture remote T12 compression fracture.  CT ABDOMEN PELVIS FINDINGS Hepatobiliary: No obvious hepatic lesions or intrahepatic biliary dilatation. The gallbladder is surgically absent. No common bile duct dilatation. Pancreas: There is marked inflammation of and around the body and tail region of the pancreas suggesting acute pancreatitis. The head is less affected. Spleen: The spleen is upper limits of normal in size and stable. Extensive calcification of the splenic artery noted. Adrenals/Urinary Tract: The adrenal glands and kidneys are grossly normal without contrast. A small right renal calculus is noted. The bladder is unremarkable. Stomach/Bowel: Stomach, duodenum, small bowel and colon are grossly normal. Moderate diffuse colonic diverticulosis without findings for acute diverticulitis. No findings for small bowel obstruction and free air. Vascular/Lymphatic: Advanced atherosclerotic calcification involving the aorta and branch vessels but no aneurysm. No mesenteric or retroperitoneal mass or adenopathy. Reproductive: Surgically absent. Other: No pelvic mass or adenopathy. No free pelvic fluid collections. No inguinal mass or adenopathy. No abdominal wall hernia or subcutaneous lesions. Musculoskeletal: Extensive lumbar fusion hardware. Prior right hip fracture fixation and evidence of prior pelvic fractures. No acute bony findings. IMPRESSION: 1. CT findings consistent with acute pancreatitis. 2. Age advanced atherosclerotic calcification involving the aorta and branch vessels including the coronary arteries. 3. No acute pulmonary findings or worrisome pulmonary lesions. 4. Complex comminuted ununited right humeral head and neck fracture. Aortic Atherosclerosis (ICD10-I70.0). Electronically Signed   By: Marijo Sanes M.D.   On: 07/04/2022 17:27   DG Chest 2 View  Result Date: 07/04/2022 CLINICAL DATA:  Left-sided chest pain EXAM: CHEST - 2 VIEW COMPARISON:  06/19/2022 FINDINGS: The heart size and mediastinal contours are within normal limits.  Both lungs are clear. Unchanged fracture of the proximal right humerus and high-grade wedge deformity of T12. IMPRESSION: No acute abnormality of the lungs. Electronically Signed   By: Delanna Ahmadi M.D.   On: 07/04/2022 13:59   US Abdomen Limited RUQ (LIVER/GB)  Result Date: 06/21/2022 CLINICAL DATA:  Elevated LFTs EXAM: ULTRASOUND ABDOMEN LIMITED RIGHT UPPER QUADRANT COMPARISON:  None Available. FINDINGS: Gallbladder: Gallbladder is surgically absent. Common bile duct: Diameter: 8 mm, within normal limits status post cholecystectomy. Liver: No focal lesion identified. Increased parenchymal echogenicity. Portal vein is patent on color Doppler imaging with normal direction of blood flow towards the liver. Other: None. IMPRESSION: Hepatic steatosis. Electronically Signed   By: Yetta Glassman M.D.   On: 06/21/2022 16:04   MR LUMBAR SPINE WO CONTRAST  Result Date: 06/20/2022 CLINICAL DATA:  Low back pain and right leg weakness. T12 compression fracture. Pain since fall in January. EXAM: MRI LUMBAR SPINE WITHOUT CONTRAST TECHNIQUE: Multiplanar, multisequence MR imaging of the lumbar spine was performed. No intravenous contrast was administered. COMPARISON:  CT of the abdomen and pelvis 06/19/2022 and 05/26/2022 FINDINGS: Segmentation: 5 non rib-bearing lumbar type vertebral bodies are present. The lowest fully formed vertebral body is L5. Alignment: Slight retrolisthesis at L1-2 is stable. 3 mm of anterolisthesis is present at T11-12. No other significant listhesis is present. Lumbar lordosis is preserved. Vertebrae: A superior endplate fracture at S96 demonstrates fluid within the fracture cleft and some edema in the upper portion of the vertebral body and more extensively on the right. 40% loss of height is present anteriorly. No focal retropulsed bone is present although there is slight anterolisthesis at T11-12. Marrow signal at L1, L2, L3 and L4 is somewhat obscured by hardware. Vertebral body heights and  marrow signal at T11, L5 and the  sacrum are otherwise normal. Conus medullaris and cauda equina: Conus extends to the T12-L1 level. Conus and cauda equina appear normal. Paraspinal and other soft tissues: Limited imaging the abdomen is unremarkable. There is no significant adenopathy. No solid organ lesions are present. Disc levels: T12-L1: T11-12: Mild right foraminal narrowing is secondary to facet spurring. The central canal is patent. T12-L1: Minimal disc bulging is present without significant stenosis. L1-2: Posterior hardware is in place. No significant stenosis is present. L2-3: Lumbar fusion and discectomy noted. No residual or recurrent stenosis is present. L3-4: Discectomy and lumbar fusion noted. No residual or recurrent stenosis is present. L4-5: A mild broad-based disc protrusion is present. Moderate facet hypertrophy is noted bilaterally. The central canal is patent. Mild left subarticular narrowing and moderate left foraminal stenosis is present. Mild right foraminal narrowing is present. L5-S1: Mild asymmetric right-sided facet hypertrophy is present. No significant disc protrusion or stenosis is present. IMPRESSION: 1. Subacute to early chronic superior endplate fracture at H84 with 40% loss of height anteriorly. No focal retropulsed bone is present. Residual edema is present in the vertebral body, worse on the right 2. Mild right foraminal narrowing at T12-L1 secondary to facet spurring. 3. Lumbar fusion at L1-2 and L3-4 without significant residual or recurrent stenosis at these levels. 4. Mild left subarticular and moderate left foraminal stenosis at L4-5. 5. Mild right foraminal narrowing at L4-5. 6. Mild asymmetric right-sided facet hypertrophy at L5-S1 without significant stenosis. Electronically Signed   By: San Morelle M.D.   On: 06/20/2022 12:41   MR BRAIN WO CONTRAST  Result Date: 06/20/2022 CLINICAL DATA:  Provided history: Neuro deficit, acute, stroke suspected; right leg  weakness. EXAM: MRI HEAD WITHOUT CONTRAST TECHNIQUE: Multiplanar, multiecho pulse sequences of the brain and surrounding structures were obtained without intravenous contrast. COMPARISON:  Head CT 06/20/2022. Prior brain MRI examinations 03/09/2022 and earlier. FINDINGS: Mild intermittent motion degradation. Brain: Mild generalized parenchymal atrophy. 6 mm extra-axial dural-based mass overlying the anterior right frontal lobe compatible with a small meningioma (series 12, image 25). Symmetric remote insults within the globus pallidus, bilaterally. Multifocal T2 FLAIR hyperintense signal abnormality within the cerebral white matter and pons, nonspecific but compatible with mild chronic small vessel ischemic disease. Tiny chronic infarcts within the left cerebellar hemisphere. There is no acute infarct. No evidence of an intracranial mass. No chronic intracranial blood products. No extra-axial fluid collection. No midline shift. Vascular: Maintained flow voids within the proximal large arterial vessels. Skull and upper cervical spine: No focal suspicious marrow lesion. Susceptibility artifact arising from ACDF hardware. Sinuses/Orbits: No mass or acute finding within the imaged orbits. No significant paranasal sinus disease. Other: Trace fluid within the left mastoid air cells. IMPRESSION: 1. Mildly motion degraded exam. 2. No evidence of acute intracranial abnormality. 3. Stable non-contrast MRI appearance of the brain. 4. 6 mm meningioma overlying the anterior right frontal lobe. 5. Symmetric foci of signal abnormality within the globus pallidus bilaterally, which may reflect chronic lacunar infarcts or sequelae of a prior toxic/metabolic insult. 6. Mild chronic small ischemic changes within the cerebral white matter and pons. 7. Tiny chronic infarcts within the left cerebellar hemisphere. 8. Mild generalized parenchymal atrophy. Electronically Signed   By: Kellie Simmering D.O.   On: 06/20/2022 12:37   CT HEAD WO  CONTRAST (5MM)  Result Date: 06/20/2022 CLINICAL DATA:  71 year old female with altered mental status, right leg weakness. EXAM: CT HEAD WITHOUT CONTRAST TECHNIQUE: Contiguous axial images were obtained from the base of the skull  through the vertex without intravenous contrast. RADIATION DOSE REDUCTION: This exam was performed according to the departmental dose-optimization program which includes automated exposure control, adjustment of the mA and/or kV according to patient size and/or use of iterative reconstruction technique. COMPARISON:  Head CT 03/14/2022, brain MRI 03/09/2022. FINDINGS: Brain: Stable cerebral volume. No midline shift, mass effect, or evidence of intracranial mass lesion. No ventriculomegaly. No acute intracranial hemorrhage identified. Chronic lacunar infarct at the left genu internal capsule is stable along with mild additional bilateral deep white matter heterogeneity. Preserved gray-white matter differentiation otherwise. No definite cortical encephalomalacia. Vascular: Extensive Calcified atherosclerosis at the skull base. No suspicious intracranial vascular hyperdensity. Skull: No acute osseous abnormality identified. Sinuses/Orbits: Visualized paranasal sinuses and mastoids are stable and well aerated. Other: No acute orbit or scalp soft tissue finding. IMPRESSION: No acute intracranial abnormality. Stable non contrast CT appearance of chronic small vessel disease. Electronically Signed   By: Genevie [name] M.D.   On: 06/20/2022 06:06   DG Chest 2 View  Result Date: 06/19/2022 CLINICAL DATA:  Chest pain EXAM: CHEST - 2 VIEW COMPARISON:  Radiographs 03/08/2022 FINDINGS: No focal consolidation, pleural effusion, or pneumothorax. Normal cardiomediastinal silhouette. No acute osseous abnormality. Chronic displaced fracture of the proximal right humerus. Aortic calcification. Cervical spine fusion hardware. IMPRESSION: No active cardiopulmonary disease. Electronically Signed   By: Placido Sou M.D.   On: 06/19/2022 20:25   CT ABDOMEN PELVIS WO CONTRAST  Result Date: 06/19/2022 CLINICAL DATA:  Abdominal pain. EXAM: CT ABDOMEN AND PELVIS WITHOUT CONTRAST TECHNIQUE: Multidetector CT imaging of the abdomen and pelvis was performed following the standard protocol without IV contrast. RADIATION DOSE REDUCTION: This exam was performed according to the departmental dose-optimization program which includes automated exposure control, adjustment of the mA and/or kV according to patient size and/or use of iterative reconstruction technique. COMPARISON:  CT abdomen pelvis dated 05/26/2022. FINDINGS: Evaluation of this exam is limited in the absence of intravenous contrast. Lower chest: The visualized lung bases are clear. There is coronary vascular calcification and calcification of the mitral annulus. No intra-abdominal free air or free fluid. Hepatobiliary: The liver is unremarkable. No biliary dilatation. Cholecystectomy. No retained calcified stone noted in the central CBD. Pancreas: There is infiltration of the fat surrounding the distal body and tail of the pancreas with loss of fat plane between the pancreas and stomach similar to prior CT. Findings most consistent with scarring and adhesions sequela of chronic pancreatitis. Correlation with pancreatic enzymes recommended to evaluate for possibility of acute on chronic pancreatitis. No drainable fluid collection, abscess, or pseudocyst. Spleen: Normal in size without focal abnormality. Adrenals/Urinary Tract: The adrenal glands unremarkable. Mild bilateral renal parenchyma atrophy. There is a 2 mm nonobstructing right renal mid pole calculus. No hydronephrosis on either side. Subcentimeter exophytic left renal interpolar hypodense lesion is too small to characterize but appears similar to prior CT, likely a cyst. The visualized ureters and urinary bladder appear unremarkable. Stomach/Bowel: There is moderate stool throughout the colon. There is no  bowel obstruction or active inflammation. The appendix is not visualized with certainty. No inflammatory changes identified in the right lower quadrant. Vascular/Lymphatic: Advanced aortoiliac atherosclerotic disease. The IVC is unremarkable. No portal venous gas. There is no adenopathy. Reproductive: Hysterectomy.  No adnexal masses. Other: None Musculoskeletal: There is osteopenia with degenerative changes spine and multilevel posterior fusion hardware. Similar appearance of T12 compression fracture as the prior CT. No new fracture. IMPRESSION: 1. No bowel obstruction. 2. A 2 mm nonobstructing right  renal mid pole calculus. No hydronephrosis. 3. Infiltration of the fat surrounding the distal body and tail of the pancreas with loss of fat plane between the pancreas and stomach similar to prior CT. Findings most consistent with scarring and adhesions sequela of chronic pancreatitis. Correlation with pancreatic enzymes recommended to evaluate for possibility of acute on chronic pancreatitis. No drainable fluid collection, abscess, or pseudocyst. 4. T12 compression fracture similar to prior CT. 5. Aortic Atherosclerosis (ICD10-I70.0). Electronically Signed   By: Anner Crete M.D.   On: 06/19/2022 20:23  [2 weeks]  Assessment:  Pleasant 71 year old female with complex medical history including chronic kidney disease, CHF, hypertension, stroke, small bowel Crohn's disease, recurrent pancreatitis presenting after recent discharge for ongoing worsening abdominal pain, chest pain, vomiting.  GI has been consulted for recurrent acute on chronic pancreatitis.   Pancreatitis: acute pancreatitis with pseudocyst noted on MR. No evidence of pancreatic duct dilation, pancreas divisum, mass. No biliary duct dilation. History of hypertriglyceridemia in the past as outlined, with levels that can be associated with pancreatitis but recently have been normal. She has suspected pancreatitis in the past on GLP-1 Agonist,  liraglutide. Currently appears that she is on semaglutide which can also be associated with pancreatitis. Previously IgG 4 unremarkable. Patient currently tolerating clear liquid diet but with persistent abdominal pain. Appetite worse today. Slight bump in her creatinine. Urine output not measured.    Normocytic anemia: no evidence of IDA. No overt GI bleeding. Last EGD/colonosocpy 2021. Capsule endoscopy in 2022. Likely multifactorial with large component due to anemia of chronic disease.    Small bowel Crohn's: she states she is on Humira but she has not seen her GI since 10/2020. I questioned if she is actually still taking having had no follow up. No diarrhea while inpatient. Would recommend outpatient follow up with Dr. Carlean Purl.      Plan:   Stop GLP-1 Agonists.  Continue supportive measures.  Consider increasing fluids given bump in creatinine, increase perfusion.    LOS: 2 days   Laureen Ochs. Bernarda Caffey [facility] [phone] 9/8/202312:25 PM

## 2022-07-06 NOTE — Care Management Important Message (Signed)
Important Message  Patient Details  Name: Heather [name] MRN: [id number] Date of Birth: [birth year]   Medicare Important Message Given:  Yes     Tommy Medal 07/06/2022, 1:45 PM

## 2022-07-07 DIAGNOSIS — K853 Drug induced acute pancreatitis without necrosis or infection: Secondary | ICD-10-CM | POA: Diagnosis not present

## 2022-07-07 LAB — BASIC METABOLIC PANEL
Anion gap: 7 (ref 5–15)
BUN: 34 mg/dL — ABNORMAL HIGH (ref 8–23)
CO2: 17 mmol/L — ABNORMAL LOW (ref 22–32)
Calcium: 8.7 mg/dL — ABNORMAL LOW (ref 8.9–10.3)
Chloride: 109 mmol/L (ref 98–111)
Creatinine, Ser: 2.13 mg/dL — ABNORMAL HIGH (ref 0.44–1.00)
GFR, Estimated: 25 mL/min — ABNORMAL LOW (ref >60)
Glucose, Bld: 256 mg/dL — ABNORMAL HIGH (ref 70–99)
Potassium: 5.3 mmol/L — ABNORMAL HIGH (ref 3.5–5.1)
Sodium: 133 mmol/L — ABNORMAL LOW (ref 135–145)

## 2022-07-07 LAB — GLUCOSE, CAPILLARY
Glucose-Capillary: 84 mg/dL (ref 70–99)
Glucose-Capillary: 202 mg/dL — ABNORMAL HIGH (ref 70–99)

## 2022-07-07 MED ORDER — SODIUM ZIRCONIUM CYCLOSILICATE 10 G PO PACK
10.0000 g | PACK | Freq: Once | ORAL | Status: AC
  Administered 2022-07-07: 10 g via ORAL
  Filled 2022-07-07: qty 1

## 2022-07-07 NOTE — Discharge Summary (Signed)
Physician Discharge Summary  Heather [name] RWE:[id number] DOB: [birth year] DOA: 07/04/2022  PCP: [name], MD  Admit date: 07/04/2022  Discharge date: 07/07/2022  Admitted From:Home  Disposition:  Home  Recommendations for Outpatient Follow-up:  Follow up with PCP in 1-2 weeks Please obtain BMP in one week Follow-up with GI Dr. Carlean Purl Avoid further use of Ozempic which was likely contributing to pancreatitis Continue other home medications as prior  Home Health: None  Equipment/Devices: None none  Discharge Condition:Stable  CODE STATUS: Full  Diet recommendation: Heart Healthy/carb modified  Brief/Interim Summary: Heather [name] is a 71 y.o. female with medical history significant of hypertension, hyperlipidemia, T2DM, CKD stage IV, Crohn's disease, chronic pancreatitis, anxiety, stroke, IBS with diarrhea who presents to the emergency department due to 3-day onset of left flank pain with radiation of pain to left upper quadrant and epigastric area.  She was admitted for recurrent acute on chronic pancreatitis and was just discharged for the same on 8/25.  She was maintained on IV fluid and diet was gradually advanced with improvement in her overall condition noted.  She is able to tolerate diet and was seen by GI with recommendations to discontinue GLP 1 agonist Ozempic which is likely causing the recurrence of her pancreatitis.  In the future it may be worthwhile discussing whether or not Humira could be discontinued as well as this is known to cause recurrence of pancreatitis as well.  She will need close follow-up with her outpatient gastroenterologist as noted above.  No other acute events or concerns noted.  Discharge Diagnoses:  Principal Problem:   Acute pancreatitis Active Problems:   Chronic kidney disease (CKD), stage IV (severe) (HCC)   Nausea & vomiting   GERD (gastroesophageal reflux disease)   Crohn's disease of small intestine without complication  (HCC)   Hypomagnesemia   Visual disturbance   Hyponatremia   Multiple falls   Humeral head fracture  Principal discharge diagnosis: Acute on chronic pancreatitis likely due to use of GLP-1 agonist.  Discharge Instructions  Discharge Instructions     Diet - low sodium heart healthy   Complete by: As directed    Increase activity slowly   Complete by: As directed    No wound care   Complete by: As directed       Allergies as of 07/07/2022       Reactions   Cozaar [losartan] Nausea And Vomiting, Swelling, Rash   Quinolones Itching, Nausea And Vomiting, Nausea Only, Swelling   Swelling of face, jaw, and lips   Cymbalta [duloxetine Hcl] Itching, Swelling   Sulfa Antibiotics Nausea And Vomiting, Swelling   Headache  Swelling of feet, legs   Synalar [fluocinolone] Swelling   Cipro [ciprofloxacin Hcl] Nausea And Vomiting, Swelling   Swelling of face, jaw, lips   Diovan [valsartan] Nausea And Vomiting, Swelling   Glucophage [metformin] Other (See Comments)   GI upset   Ketalar [ketamine] Swelling   Norco [hydrocodone-acetaminophen] Itching, Swelling   Norvasc [amlodipine Besylate] Itching, Swelling   Fluid retention    Nsaids Nausea And Vomiting, Other (See Comments)   Has bleeding ulcers   Tape Itching, Rash   Paper tape can only be used on this patient   Zestril [lisinopril] Swelling, Rash   Oral swelling Red streaks on arms/legs        Medication List     STOP taking these medications    Ozempic (1 MG/DOSE) 4 MG/3ML Sopn Generic drug: Semaglutide (1 MG/DOSE)  TAKE these medications    acetaminophen 325 MG tablet Commonly known as: TYLENOL Take 2 tablets (650 mg total) by mouth every 4 (four) hours.   allopurinol 100 MG tablet Commonly known as: ZYLOPRIM Take 100 mg by mouth daily.   atorvastatin 80 MG tablet Commonly known as: LIPITOR Take 1 tablet (80 mg total) by mouth daily. Restart on 06/29/22   busPIRone 10 MG tablet Commonly known as:  BUSPAR Take 10 mg by mouth 3 (three) times daily as needed for anxiety.   calcitRIOL 0.25 MCG capsule Commonly known as: ROCALTROL Take 0.25 mcg by mouth 3 (three) times a week. Monday,Wed and Fri.   cyanocobalamin 1000 MCG/ML injection Commonly known as: VITAMIN B12 Inject 1,000 mcg into the muscle every 30 (thirty) days.   dicyclomine 20 MG tablet Commonly known as: BENTYL Take 1 tablet (20 mg total) by mouth in the morning and at bedtime.   diphenhydrAMINE 2 % cream Commonly known as: BENADRYL Apply topically 3 (three) times daily as needed for itching.   esomeprazole 40 MG capsule Commonly known as: NEXIUM Take 40 mg by mouth daily before breakfast.   furosemide 40 MG tablet Commonly known as: LASIX Take 1 tablet (40 mg total) by mouth daily.   gabapentin 300 MG capsule Commonly known as: NEURONTIN Take 1 capsule (300 mg total) by mouth at bedtime.   hydrALAZINE 50 MG tablet Commonly known as: APRESOLINE Take 1 tablet (50 mg total) by mouth 2 (two) times daily.   insulin aspart 100 UNIT/ML FlexPen Commonly known as: NOVOLOG Inject 0-12 Units into the skin 3 (three) times daily with meals. insulin aspart (novoLOG) injection 0-12 Units 0-12 Units Subcutaneous, 3 times daily with meals CBG < 70: Implement Hypoglycemia Standing Orders and refer to Hypoglycemia Standing Orders sidebar report  CBG 70 - 120: 0 unit CBG 121 - 150: 0 unit  CBG 151 - 200: 2 unit CBG 201 - 250: 4 units CBG 251 - 300: 6 units CBG 301 - 350: 8 units  CBG 351 - 400: 10 units  CBG > 400: 12 units   Levemir FlexPen 100 UNIT/ML FlexPen Generic drug: insulin detemir Inject 10 Units into the skin at bedtime.   levETIRAcetam 500 MG tablet Commonly known as: KEPPRA Take 500 mg by mouth 2 (two) times daily.   metoprolol succinate 100 MG 24 hr tablet Commonly known as: Toprol XL Take 1 tablet (100 mg total) by mouth daily. Take with or immediately following a meal.   temazepam 15 MG capsule Commonly  known as: RESTORIL Take 15 mg by mouth at bedtime as needed.   tiZANidine 4 MG tablet Commonly known as: ZANAFLEX Take 4 mg by mouth 2 (two) times daily.   valsartan 80 MG tablet Commonly known as: DIOVAN Take 80 mg by mouth daily.   vitamin C with rose hips 500 MG tablet Take 500 mg by mouth every morning.   Vitamin D 50 MCG (2000 UT) Caps Take 2,000 Units by mouth every morning.   ziprasidone 20 MG capsule Commonly known as: GEODON Take 20 mg by mouth 2 (two) times daily.        Follow-up Information     [name], MD. Schedule an appointment as soon as possible for a visit in 1 week(s).   Specialty: Internal Medicine Contact information: [address]  [place] [address] [phone]         Gatha Mayer, MD. Go to.   Specialty: Gastroenterology Contact information: [address]  [place] [address] [phone]                Allergies  Allergen Reactions   Cozaar [Losartan] Nausea And Vomiting, Swelling and Rash   Quinolones Itching, Nausea And Vomiting, Nausea Only and Swelling    Swelling of face, jaw, and lips    Cymbalta [Duloxetine Hcl] Itching and Swelling   Sulfa Antibiotics Nausea And Vomiting and Swelling    Headache  Swelling of feet, legs    Synalar [Fluocinolone] Swelling   Cipro [Ciprofloxacin Hcl] Nausea And Vomiting and Swelling    Swelling of face, jaw, lips   Diovan [Valsartan] Nausea And Vomiting and Swelling   Glucophage [Metformin] Other (See Comments)    GI upset   Ketalar [Ketamine] Swelling   Norco [Hydrocodone-Acetaminophen] Itching and Swelling   Norvasc [Amlodipine Besylate] Itching and Swelling    Fluid retention    Nsaids Nausea And Vomiting and Other (See Comments)    Has bleeding ulcers   Tape Itching and Rash    Paper tape can only be used on this patient   Zestril [Lisinopril] Swelling and Rash    Oral swelling Red streaks on arms/legs    Consultations: GI   Procedures/Studies: MR  ABDOMEN MRCP WO CONTRAST  Result Date: 07/05/2022 CLINICAL DATA:  Abdominal pain and vomiting.  Acute pancreatitis. EXAM: MRI ABDOMEN WITHOUT CONTRAST  (INCLUDING MRCP) TECHNIQUE: Multiplanar multisequence MR imaging of the abdomen was performed. Heavily T2-weighted images of the biliary and pancreatic ducts were obtained, and three-dimensional MRCP images were rendered by post processing. COMPARISON:  Noncontrast CT on 07/04/2022 FINDINGS: Lower chest: No acute findings. Hepatobiliary: No masses visualized on this unenhanced exam. Diffusely decreased hepatic T2 signal intensity, consistent with hemosiderosis. Prior cholecystectomy is noted. No evidence of biliary ductal dilatation, with common bile duct measuring 4 mm. No evidence of choledocholithiasis. Pancreas: No evidence of pancreatic mass or ductal dilatation. No evidence of pancreas divisum. Mild to moderate peripancreatic inflammatory changes are seen. A small peripancreatic fluid collection is seen along the posterior and superior aspect of the pancreatic tail measuring 2.0 by 1.4 cm, consistent with a small pseudocyst. Spleen: Within normal limits in size. No splenic lesions identified. Diffusely decreased T2 signal intensity is consistent with transfusional siderosis. Adrenals/Urinary tract: Unremarkable. No evidence of mass or hydronephrosis. Stomach/Bowel: Unremarkable. Vascular/Lymphatic: No pathologically enlarged lymph nodes identified. No evidence of abdominal aortic aneurysm. Other:  None. Musculoskeletal:  No suspicious bone lesions identified. IMPRESSION: Mild to moderate acute pancreatitis, with 2 cm pseudocyst adjacent the pancreatic tail. No evidence of pancreatic ductal dilatation or pancreas divisum. Prior cholecystectomy. No evidence of biliary ductal dilatation or choledocholithiasis. Transfusional siderosis. Electronically Signed   By: Marlaine Hind M.D.   On: 07/05/2022 11:39   MR 3D Recon At Scanner  Result Date: 07/05/2022 CLINICAL  DATA:  Abdominal pain and vomiting.  Acute pancreatitis. EXAM: MRI ABDOMEN WITHOUT CONTRAST  (INCLUDING MRCP) TECHNIQUE: Multiplanar multisequence MR imaging of the abdomen was performed. Heavily T2-weighted images of the biliary and pancreatic ducts were obtained, and three-dimensional MRCP images were rendered by post processing. COMPARISON:  Noncontrast CT on 07/04/2022 FINDINGS: Lower chest: No acute findings. Hepatobiliary: No masses visualized on this unenhanced exam. Diffusely decreased hepatic T2 signal intensity, consistent with hemosiderosis. Prior cholecystectomy is noted. No evidence of biliary ductal dilatation, with common bile duct measuring 4 mm. No evidence of choledocholithiasis. Pancreas: No evidence of pancreatic mass or ductal dilatation. No evidence of pancreas divisum. Mild to moderate peripancreatic inflammatory changes  are seen. A small peripancreatic fluid collection is seen along the posterior and superior aspect of the pancreatic tail measuring 2.0 by 1.4 cm, consistent with a small pseudocyst. Spleen: Within normal limits in size. No splenic lesions identified. Diffusely decreased T2 signal intensity is consistent with transfusional siderosis. Adrenals/Urinary tract: Unremarkable. No evidence of mass or hydronephrosis. Stomach/Bowel: Unremarkable. Vascular/Lymphatic: No pathologically enlarged lymph nodes identified. No evidence of abdominal aortic aneurysm. Other:  None. Musculoskeletal:  No suspicious bone lesions identified. IMPRESSION: Mild to moderate acute pancreatitis, with 2 cm pseudocyst adjacent the pancreatic tail. No evidence of pancreatic ductal dilatation or pancreas divisum. Prior cholecystectomy. No evidence of biliary ductal dilatation or choledocholithiasis. Transfusional siderosis. Electronically Signed   By: Marlaine Hind M.D.   On: 07/05/2022 11:39   US Abdomen Limited  Result Date: 07/05/2022 CLINICAL DATA:  Abdominal pain EXAM: ULTRASOUND ABDOMEN LIMITED RIGHT  UPPER QUADRANT COMPARISON:  CT abdomen pelvis 07/04/2022 FINDINGS: Gallbladder: Status post cholecystectomy Common bile duct: Diameter: 5 mm Liver: Parenchymal echogenicity: Within normal limits Contours: Normal Lesions: None Portal vein: Limited visualization. Other: None. IMPRESSION: 1. No significant sonographic abnormality of the liver. 2. Limited assessment of the portal vein. Electronically Signed   By: Miachel Roux M.D.   On: 07/05/2022 09:17   CT CHEST ABDOMEN PELVIS WO CONTRAST  Result Date: 07/04/2022 CLINICAL DATA:  Shortness of breath, chest pain and vomiting today. EXAM: CT CHEST, ABDOMEN AND PELVIS WITHOUT CONTRAST TECHNIQUE: Multidetector CT imaging of the chest, abdomen and pelvis was performed following the standard protocol without IV contrast. RADIATION DOSE REDUCTION: This exam was performed according to the departmental dose-optimization program which includes automated exposure control, adjustment of the mA and/or kV according to patient size and/or use of iterative reconstruction technique. COMPARISON:  Abdominal CT scan 06/19/2022 FINDINGS: CT CHEST FINDINGS Cardiovascular: The heart is normal in size. No pericardial effusion. The aorta is normal in caliber. Age advanced atherosclerotic calcification involving the aorta and coronary arteries. Mediastinum/Nodes: No mediastinal or hilar mass or adenopathy. The esophagus is grossly normal. Lungs/Pleura: No infiltrates, edema or effusions. No worrisome pulmonary lesions or pulmonary nodules. No interstitial lung disease or bronchiectasis. Musculoskeletal: There is a complex comminuted fragmented ununited right humeral head and neck fracture remote T12 compression fracture. CT ABDOMEN PELVIS FINDINGS Hepatobiliary: No obvious hepatic lesions or intrahepatic biliary dilatation. The gallbladder is surgically absent. No common bile duct dilatation. Pancreas: There is marked inflammation of and around the body and tail region of the pancreas  suggesting acute pancreatitis. The head is less affected. Spleen: The spleen is upper limits of normal in size and stable. Extensive calcification of the splenic artery noted. Adrenals/Urinary Tract: The adrenal glands and kidneys are grossly normal without contrast. A small right renal calculus is noted. The bladder is unremarkable. Stomach/Bowel: Stomach, duodenum, small bowel and colon are grossly normal. Moderate diffuse colonic diverticulosis without findings for acute diverticulitis. No findings for small bowel obstruction and free air. Vascular/Lymphatic: Advanced atherosclerotic calcification involving the aorta and branch vessels but no aneurysm. No mesenteric or retroperitoneal mass or adenopathy. Reproductive: Surgically absent. Other: No pelvic mass or adenopathy. No free pelvic fluid collections. No inguinal mass or adenopathy. No abdominal wall hernia or subcutaneous lesions. Musculoskeletal: Extensive lumbar fusion hardware. Prior right hip fracture fixation and evidence of prior pelvic fractures. No acute bony findings. IMPRESSION: 1. CT findings consistent with acute pancreatitis. 2. Age advanced atherosclerotic calcification involving the aorta and branch vessels including the coronary arteries. 3. No acute pulmonary findings or  worrisome pulmonary lesions. 4. Complex comminuted ununited right humeral head and neck fracture. Aortic Atherosclerosis (ICD10-I70.0). Electronically Signed   By: Marijo Sanes M.D.   On: 07/04/2022 17:27   DG Chest 2 View  Result Date: 07/04/2022 CLINICAL DATA:  Left-sided chest pain EXAM: CHEST - 2 VIEW COMPARISON:  06/19/2022 FINDINGS: The heart size and mediastinal contours are within normal limits. Both lungs are clear. Unchanged fracture of the proximal right humerus and high-grade wedge deformity of T12. IMPRESSION: No acute abnormality of the lungs. Electronically Signed   By: Delanna Ahmadi M.D.   On: 07/04/2022 13:59   US Abdomen Limited RUQ  (LIVER/GB)  Result Date: 06/21/2022 CLINICAL DATA:  Elevated LFTs EXAM: ULTRASOUND ABDOMEN LIMITED RIGHT UPPER QUADRANT COMPARISON:  None Available. FINDINGS: Gallbladder: Gallbladder is surgically absent. Common bile duct: Diameter: 8 mm, within normal limits status post cholecystectomy. Liver: No focal lesion identified. Increased parenchymal echogenicity. Portal vein is patent on color Doppler imaging with normal direction of blood flow towards the liver. Other: None. IMPRESSION: Hepatic steatosis. Electronically Signed   By: Yetta Glassman M.D.   On: 06/21/2022 16:04   MR LUMBAR SPINE WO CONTRAST  Result Date: 06/20/2022 CLINICAL DATA:  Low back pain and right leg weakness. T12 compression fracture. Pain since fall in January. EXAM: MRI LUMBAR SPINE WITHOUT CONTRAST TECHNIQUE: Multiplanar, multisequence MR imaging of the lumbar spine was performed. No intravenous contrast was administered. COMPARISON:  CT of the abdomen and pelvis 06/19/2022 and 05/26/2022 FINDINGS: Segmentation: 5 non rib-bearing lumbar type vertebral bodies are present. The lowest fully formed vertebral body is L5. Alignment: Slight retrolisthesis at L1-2 is stable. 3 mm of anterolisthesis is present at T11-12. No other significant listhesis is present. Lumbar lordosis is preserved. Vertebrae: A superior endplate fracture at W73 demonstrates fluid within the fracture cleft and some edema in the upper portion of the vertebral body and more extensively on the right. 40% loss of height is present anteriorly. No focal retropulsed bone is present although there is slight anterolisthesis at T11-12. Marrow signal at L1, L2, L3 and L4 is somewhat obscured by hardware. Vertebral body heights and marrow signal at T11, L5 and the sacrum are otherwise normal. Conus medullaris and cauda equina: Conus extends to the T12-L1 level. Conus and cauda equina appear normal. Paraspinal and other soft tissues: Limited imaging the abdomen is unremarkable.  There is no significant adenopathy. No solid organ lesions are present. Disc levels: T12-L1: T11-12: Mild right foraminal narrowing is secondary to facet spurring. The central canal is patent. T12-L1: Minimal disc bulging is present without significant stenosis. L1-2: Posterior hardware is in place. No significant stenosis is present. L2-3: Lumbar fusion and discectomy noted. No residual or recurrent stenosis is present. L3-4: Discectomy and lumbar fusion noted. No residual or recurrent stenosis is present. L4-5: A mild broad-based disc protrusion is present. Moderate facet hypertrophy is noted bilaterally. The central canal is patent. Mild left subarticular narrowing and moderate left foraminal stenosis is present. Mild right foraminal narrowing is present. L5-S1: Mild asymmetric right-sided facet hypertrophy is present. No significant disc protrusion or stenosis is present. IMPRESSION: 1. Subacute to early chronic superior endplate fracture at X10 with 40% loss of height anteriorly. No focal retropulsed bone is present. Residual edema is present in the vertebral body, worse on the right 2. Mild right foraminal narrowing at T12-L1 secondary to facet spurring. 3. Lumbar fusion at L1-2 and L3-4 without significant residual or recurrent stenosis at these levels. 4. Mild left subarticular and  moderate left foraminal stenosis at L4-5. 5. Mild right foraminal narrowing at L4-5. 6. Mild asymmetric right-sided facet hypertrophy at L5-S1 without significant stenosis. Electronically Signed   By: San Morelle M.D.   On: 06/20/2022 12:41   MR BRAIN WO CONTRAST  Result Date: 06/20/2022 CLINICAL DATA:  Provided history: Neuro deficit, acute, stroke suspected; right leg weakness. EXAM: MRI HEAD WITHOUT CONTRAST TECHNIQUE: Multiplanar, multiecho pulse sequences of the brain and surrounding structures were obtained without intravenous contrast. COMPARISON:  Head CT 06/20/2022. Prior brain MRI examinations 03/09/2022 and  earlier. FINDINGS: Mild intermittent motion degradation. Brain: Mild generalized parenchymal atrophy. 6 mm extra-axial dural-based mass overlying the anterior right frontal lobe compatible with a small meningioma (series 12, image 25). Symmetric remote insults within the globus pallidus, bilaterally. Multifocal T2 FLAIR hyperintense signal abnormality within the cerebral white matter and pons, nonspecific but compatible with mild chronic small vessel ischemic disease. Tiny chronic infarcts within the left cerebellar hemisphere. There is no acute infarct. No evidence of an intracranial mass. No chronic intracranial blood products. No extra-axial fluid collection. No midline shift. Vascular: Maintained flow voids within the proximal large arterial vessels. Skull and upper cervical spine: No focal suspicious marrow lesion. Susceptibility artifact arising from ACDF hardware. Sinuses/Orbits: No mass or acute finding within the imaged orbits. No significant paranasal sinus disease. Other: Trace fluid within the left mastoid air cells. IMPRESSION: 1. Mildly motion degraded exam. 2. No evidence of acute intracranial abnormality. 3. Stable non-contrast MRI appearance of the brain. 4. 6 mm meningioma overlying the anterior right frontal lobe. 5. Symmetric foci of signal abnormality within the globus pallidus bilaterally, which may reflect chronic lacunar infarcts or sequelae of a prior toxic/metabolic insult. 6. Mild chronic small ischemic changes within the cerebral white matter and pons. 7. Tiny chronic infarcts within the left cerebellar hemisphere. 8. Mild generalized parenchymal atrophy. Electronically Signed   By: Kellie Simmering D.O.   On: 06/20/2022 12:37   CT HEAD WO CONTRAST (5MM)  Result Date: 06/20/2022 CLINICAL DATA:  71 year old female with altered mental status, right leg weakness. EXAM: CT HEAD WITHOUT CONTRAST TECHNIQUE: Contiguous axial images were obtained from the base of the skull through the vertex  without intravenous contrast. RADIATION DOSE REDUCTION: This exam was performed according to the departmental dose-optimization program which includes automated exposure control, adjustment of the mA and/or kV according to patient size and/or use of iterative reconstruction technique. COMPARISON:  Head CT 03/14/2022, brain MRI 03/09/2022. FINDINGS: Brain: Stable cerebral volume. No midline shift, mass effect, or evidence of intracranial mass lesion. No ventriculomegaly. No acute intracranial hemorrhage identified. Chronic lacunar infarct at the left genu internal capsule is stable along with mild additional bilateral deep white matter heterogeneity. Preserved gray-white matter differentiation otherwise. No definite cortical encephalomalacia. Vascular: Extensive Calcified atherosclerosis at the skull base. No suspicious intracranial vascular hyperdensity. Skull: No acute osseous abnormality identified. Sinuses/Orbits: Visualized paranasal sinuses and mastoids are stable and well aerated. Other: No acute orbit or scalp soft tissue finding. IMPRESSION: No acute intracranial abnormality. Stable non contrast CT appearance of chronic small vessel disease. Electronically Signed   By: Genevie [name] M.D.   On: 06/20/2022 06:06   DG Chest 2 View  Result Date: 06/19/2022 CLINICAL DATA:  Chest pain EXAM: CHEST - 2 VIEW COMPARISON:  Radiographs 03/08/2022 FINDINGS: No focal consolidation, pleural effusion, or pneumothorax. Normal cardiomediastinal silhouette. No acute osseous abnormality. Chronic displaced fracture of the proximal right humerus. Aortic calcification. Cervical spine fusion hardware. IMPRESSION: No active cardiopulmonary disease.  Electronically Signed   By: Placido Sou M.D.   On: 06/19/2022 20:25   CT ABDOMEN PELVIS WO CONTRAST  Result Date: 06/19/2022 CLINICAL DATA:  Abdominal pain. EXAM: CT ABDOMEN AND PELVIS WITHOUT CONTRAST TECHNIQUE: Multidetector CT imaging of the abdomen and pelvis was performed  following the standard protocol without IV contrast. RADIATION DOSE REDUCTION: This exam was performed according to the departmental dose-optimization program which includes automated exposure control, adjustment of the mA and/or kV according to patient size and/or use of iterative reconstruction technique. COMPARISON:  CT abdomen pelvis dated 05/26/2022. FINDINGS: Evaluation of this exam is limited in the absence of intravenous contrast. Lower chest: The visualized lung bases are clear. There is coronary vascular calcification and calcification of the mitral annulus. No intra-abdominal free air or free fluid. Hepatobiliary: The liver is unremarkable. No biliary dilatation. Cholecystectomy. No retained calcified stone noted in the central CBD. Pancreas: There is infiltration of the fat surrounding the distal body and tail of the pancreas with loss of fat plane between the pancreas and stomach similar to prior CT. Findings most consistent with scarring and adhesions sequela of chronic pancreatitis. Correlation with pancreatic enzymes recommended to evaluate for possibility of acute on chronic pancreatitis. No drainable fluid collection, abscess, or pseudocyst. Spleen: Normal in size without focal abnormality. Adrenals/Urinary Tract: The adrenal glands unremarkable. Mild bilateral renal parenchyma atrophy. There is a 2 mm nonobstructing right renal mid pole calculus. No hydronephrosis on either side. Subcentimeter exophytic left renal interpolar hypodense lesion is too small to characterize but appears similar to prior CT, likely a cyst. The visualized ureters and urinary bladder appear unremarkable. Stomach/Bowel: There is moderate stool throughout the colon. There is no bowel obstruction or active inflammation. The appendix is not visualized with certainty. No inflammatory changes identified in the right lower quadrant. Vascular/Lymphatic: Advanced aortoiliac atherosclerotic disease. The IVC is unremarkable. No  portal venous gas. There is no adenopathy. Reproductive: Hysterectomy.  No adnexal masses. Other: None Musculoskeletal: There is osteopenia with degenerative changes spine and multilevel posterior fusion hardware. Similar appearance of T12 compression fracture as the prior CT. No new fracture. IMPRESSION: 1. No bowel obstruction. 2. A 2 mm nonobstructing right renal mid pole calculus. No hydronephrosis. 3. Infiltration of the fat surrounding the distal body and tail of the pancreas with loss of fat plane between the pancreas and stomach similar to prior CT. Findings most consistent with scarring and adhesions sequela of chronic pancreatitis. Correlation with pancreatic enzymes recommended to evaluate for possibility of acute on chronic pancreatitis. No drainable fluid collection, abscess, or pseudocyst. 4. T12 compression fracture similar to prior CT. 5. Aortic Atherosclerosis (ICD10-I70.0). Electronically Signed   By: Anner Crete M.D.   On: 06/19/2022 20:23     Discharge Exam: Vitals:   07/07/22 0600 07/07/22 1303  BP: (!) 155/66 (!) 148/70  Pulse: 99 82  Resp: 18 18  Temp:    SpO2: 100% 100%   Vitals:   07/06/22 1400 07/06/22 2000 07/07/22 0600 07/07/22 1303  BP: (!) 170/74 105/60 (!) 155/66 (!) 148/70  Pulse: 99 97 99 82  Resp: '18 16 18 18  '$ Temp: 98.5 F (36.9 C) 98.9 F (37.2 C)    TempSrc: Oral Oral    SpO2: 100% 100% 100% 100%  Weight:      Height:        General: Pt is alert, awake, not in acute distress Cardiovascular: RRR, S1/S2 +, no rubs, no gallops Respiratory: CTA bilaterally, no wheezing, no rhonchi Abdominal: Soft,  NT, ND, bowel sounds + Extremities: no edema, no cyanosis    The results of significant diagnostics from this hospitalization (including imaging, microbiology, ancillary and laboratory) are listed below for reference.     Microbiology: No results found for this or any previous visit (from the past 240 hour(s)).   Labs: BNP (last 3  results) Recent Labs    08/19/21 0728  BNP 956.2*   Basic Metabolic Panel: Recent Labs  Lab 07/04/22 1516 07/04/22 2147 07/05/22 0342 07/06/22 0334 07/07/22 1223  NA 126*  --  131* 129* 133*  K 5.0  --  4.4 4.5 5.3*  CL 96*  --  103 100 109  CO2 19*  --  21* 20* 17*  GLUCOSE 518*  --  131* 253* 256*  BUN 47*  --  46* 41* 34*  CREATININE 2.68* 2.70* 2.59* 2.72* 2.13*  CALCIUM 8.7*  --  9.0 9.1 8.7*  MG 1.3*  --  1.9  --   --   PHOS  --   --  4.1  --   --    Liver Function Tests: Recent Labs  Lab 07/04/22 1827 07/05/22 0342 07/06/22 0334  AST 13* 15 15  ALT '16 15 15  '$ ALKPHOS 96 93 102  BILITOT 0.6 0.7 0.7  PROT 6.6 6.5 6.7  ALBUMIN 2.8* 2.8* 2.9*   Recent Labs  Lab 07/04/22 1827 07/06/22 0334  LIPASE 86* 65*   No results for input(s): "AMMONIA" in the last 168 hours. CBC: Recent Labs  Lab 07/04/22 1516 07/04/22 2147 07/05/22 0342 07/06/22 0334  WBC 9.0 11.0* 9.5 5.4  HGB 7.7* 8.0* 8.0* 8.3*  HCT 23.2* 23.8* 24.3* 25.5*  MCV 96.7 96.4 96.0 98.5  PLT 151 183 155 158   Cardiac Enzymes: No results for input(s): "CKTOTAL", "CKMB", "CKMBINDEX", "TROPONINI" in the last 168 hours. BNP: Invalid input(s): "POCBNP" CBG: Recent Labs  Lab 07/05/22 2245 07/06/22 0712 07/06/22 1110 07/06/22 1608 07/06/22 2134  GLUCAP 177* 199* 166* 186* 282*   D-Dimer No results for input(s): "DDIMER" in the last 72 hours. Hgb A1c No results for input(s): "HGBA1C" in the last 72 hours. Lipid Profile No results for input(s): "CHOL", "HDL", "LDLCALC", "TRIG", "CHOLHDL", "LDLDIRECT" in the last 72 hours. Thyroid function studies No results for input(s): "TSH", "T4TOTAL", "T3FREE", "THYROIDAB" in the last 72 hours.  Invalid input(s): "FREET3" Anemia work up No results for input(s): "VITAMINB12", "FOLATE", "FERRITIN", "TIBC", "IRON", "RETICCTPCT" in the last 72 hours. Urinalysis    Component Value Date/Time   COLORURINE YELLOW 07/04/2022 1700   APPEARANCEUR CLEAR  07/04/2022 1700   LABSPEC 1.014 07/04/2022 1700   PHURINE 6.0 07/04/2022 1700   GLUCOSEU >=500 (A) 07/04/2022 1700   GLUCOSEU NEGATIVE 03/28/2018 1224   HGBUR NEGATIVE 07/04/2022 1700   BILIRUBINUR NEGATIVE 07/04/2022 1700   KETONESUR NEGATIVE 07/04/2022 1700   PROTEINUR 100 (A) 07/04/2022 1700   UROBILINOGEN 1.0 03/28/2018 1224   NITRITE NEGATIVE 07/04/2022 1700   LEUKOCYTESUR NEGATIVE 07/04/2022 1700   Sepsis Labs Recent Labs  Lab 07/04/22 1516 07/04/22 2147 07/05/22 0342 07/06/22 0334  WBC 9.0 11.0* 9.5 5.4   Microbiology No results found for this or any previous visit (from the past 240 hour(s)).   Time coordinating discharge: 35 minutes  SIGNED:   Rodena Goldmann, DO [facility] 07/07/2022, 1:19 PM  If 7PM-7AM, please contact night-coverage [email or web address]

## 2022-07-07 NOTE — Plan of Care (Signed)
  Problem: Education: Goal: Knowledge of General Education information will improve Description: Including pain rating scale, medication(s)/side effects and non-pharmacologic comfort measures 07/07/2022 1318 by Santa Lighter, RN Outcome: Adequate for Discharge 07/07/2022 1020 by Santa Lighter, RN Outcome: Progressing   Problem: Health Behavior/Discharge Planning: Goal: Ability to manage health-related needs will improve Outcome: Adequate for Discharge   Problem: Clinical Measurements: Goal: Ability to maintain clinical measurements within normal limits will improve Outcome: Adequate for Discharge Goal: Will remain free from infection Outcome: Adequate for Discharge Goal: Diagnostic test results will improve Outcome: Adequate for Discharge Goal: Respiratory complications will improve Outcome: Adequate for Discharge Goal: Cardiovascular complication will be avoided Outcome: Adequate for Discharge   Problem: Activity: Goal: Risk for activity intolerance will decrease Outcome: Adequate for Discharge   Problem: Nutrition: Goal: Adequate nutrition will be maintained Outcome: Adequate for Discharge   Problem: Coping: Goal: Level of anxiety will decrease Outcome: Adequate for Discharge   Problem: Elimination: Goal: Will not experience complications related to bowel motility Outcome: Adequate for Discharge Goal: Will not experience complications related to urinary retention Outcome: Adequate for Discharge   Problem: Pain Managment: Goal: General experience of comfort will improve Outcome: Adequate for Discharge   Problem: Safety: Goal: Ability to remain free from injury will improve Outcome: Adequate for Discharge   Problem: Skin Integrity: Goal: Risk for impaired skin integrity will decrease Outcome: Adequate for Discharge   Problem: Education: Goal: Ability to describe self-care measures that may prevent or decrease complications (Diabetes Survival Skills Education) will  improve Outcome: Adequate for Discharge Goal: Individualized Educational Video(s) Outcome: Adequate for Discharge   Problem: Coping: Goal: Ability to adjust to condition or change in health will improve Outcome: Adequate for Discharge   Problem: Fluid Volume: Goal: Ability to maintain a balanced intake and output will improve Outcome: Adequate for Discharge   Problem: Health Behavior/Discharge Planning: Goal: Ability to identify and utilize available resources and services will improve Outcome: Adequate for Discharge Goal: Ability to manage health-related needs will improve Outcome: Adequate for Discharge   Problem: Metabolic: Goal: Ability to maintain appropriate glucose levels will improve Outcome: Adequate for Discharge   Problem: Nutritional: Goal: Maintenance of adequate nutrition will improve Outcome: Adequate for Discharge Goal: Progress toward achieving an optimal weight will improve Outcome: Adequate for Discharge   Problem: Skin Integrity: Goal: Risk for impaired skin integrity will decrease Outcome: Adequate for Discharge   Problem: Skin Integrity: Goal: Risk for impaired skin integrity will decrease Outcome: Adequate for Discharge   Problem: Tissue Perfusion: Goal: Adequacy of tissue perfusion will improve Outcome: Adequate for Discharge   Problem: Acute Rehab PT Goals(only PT should resolve) Goal: Pt Will Go Supine/Side To Sit Outcome: Adequate for Discharge Goal: Patient Will Transfer Sit To/From Stand Outcome: Adequate for Discharge Goal: Pt Will Transfer Bed To Chair/Chair To Bed Outcome: Adequate for Discharge Goal: Pt Will Ambulate Outcome: Adequate for Discharge

## 2022-07-07 NOTE — Progress Notes (Signed)
Nsg Discharge Note  Admit Date:  07/04/2022 Discharge date: 07/07/2022   Heather [name] to be D/C'd Home per MD order.  AVS completed.   Removed IV-CDI. Reviewed d/c paperwork with patient and husband. Wheeled stable patient and belonging to main entrance where she was picked up by her husband.  Patient/caregiver able to verbalize understanding.  Discharge Medication: Allergies as of 07/07/2022       Reactions   Cozaar [losartan] Nausea And Vomiting, Swelling, Rash   Quinolones Itching, Nausea And Vomiting, Nausea Only, Swelling   Swelling of face, jaw, and lips   Cymbalta [duloxetine Hcl] Itching, Swelling   Sulfa Antibiotics Nausea And Vomiting, Swelling   Headache  Swelling of feet, legs   Synalar [fluocinolone] Swelling   Cipro [ciprofloxacin Hcl] Nausea And Vomiting, Swelling   Swelling of face, jaw, lips   Diovan [valsartan] Nausea And Vomiting, Swelling   Glucophage [metformin] Other (See Comments)   GI upset   Ketalar [ketamine] Swelling   Norco [hydrocodone-acetaminophen] Itching, Swelling   Norvasc [amlodipine Besylate] Itching, Swelling   Fluid retention    Nsaids Nausea And Vomiting, Other (See Comments)   Has bleeding ulcers   Tape Itching, Rash   Paper tape can only be used on this patient   Zestril [lisinopril] Swelling, Rash   Oral swelling Red streaks on arms/legs        Medication List     STOP taking these medications    Ozempic (1 MG/DOSE) 4 MG/3ML Sopn Generic drug: Semaglutide (1 MG/DOSE)       TAKE these medications    acetaminophen 325 MG tablet Commonly known as: TYLENOL Take 2 tablets (650 mg total) by mouth every 4 (four) hours.   allopurinol 100 MG tablet Commonly known as: ZYLOPRIM Take 100 mg by mouth daily.   atorvastatin 80 MG tablet Commonly known as: LIPITOR Take 1 tablet (80 mg total) by mouth daily. Restart on 06/29/22   busPIRone 10 MG tablet Commonly known as: BUSPAR Take 10 mg by mouth 3 (three) times daily as  needed for anxiety.   calcitRIOL 0.25 MCG capsule Commonly known as: ROCALTROL Take 0.25 mcg by mouth 3 (three) times a week. Monday,Wed and Fri.   cyanocobalamin 1000 MCG/ML injection Commonly known as: VITAMIN B12 Inject 1,000 mcg into the muscle every 30 (thirty) days.   dicyclomine 20 MG tablet Commonly known as: BENTYL Take 1 tablet (20 mg total) by mouth in the morning and at bedtime.   diphenhydrAMINE 2 % cream Commonly known as: BENADRYL Apply topically 3 (three) times daily as needed for itching.   esomeprazole 40 MG capsule Commonly known as: NEXIUM Take 40 mg by mouth daily before breakfast.   furosemide 40 MG tablet Commonly known as: LASIX Take 1 tablet (40 mg total) by mouth daily.   gabapentin 300 MG capsule Commonly known as: NEURONTIN Take 1 capsule (300 mg total) by mouth at bedtime.   hydrALAZINE 50 MG tablet Commonly known as: APRESOLINE Take 1 tablet (50 mg total) by mouth 2 (two) times daily.   insulin aspart 100 UNIT/ML FlexPen Commonly known as: NOVOLOG Inject 0-12 Units into the skin 3 (three) times daily with meals. insulin aspart (novoLOG) injection 0-12 Units 0-12 Units Subcutaneous, 3 times daily with meals CBG < 70: Implement Hypoglycemia Standing Orders and refer to Hypoglycemia Standing Orders sidebar report  CBG 70 - 120: 0 unit CBG 121 - 150: 0 unit  CBG 151 - 200: 2 unit CBG 201 - 250: 4 units  CBG 251 - 300: 6 units CBG 301 - 350: 8 units  CBG 351 - 400: 10 units  CBG > 400: 12 units   Levemir FlexPen 100 UNIT/ML FlexPen Generic drug: insulin detemir Inject 10 Units into the skin at bedtime.   levETIRAcetam 500 MG tablet Commonly known as: KEPPRA Take 500 mg by mouth 2 (two) times daily.   metoprolol succinate 100 MG 24 hr tablet Commonly known as: Toprol XL Take 1 tablet (100 mg total) by mouth daily. Take with or immediately following a meal.   temazepam 15 MG capsule Commonly known as: RESTORIL Take 15 mg by mouth at bedtime as  needed.   tiZANidine 4 MG tablet Commonly known as: ZANAFLEX Take 4 mg by mouth 2 (two) times daily.   valsartan 80 MG tablet Commonly known as: DIOVAN Take 80 mg by mouth daily.   vitamin C with rose hips 500 MG tablet Take 500 mg by mouth every morning.   Vitamin D 50 MCG (2000 UT) Caps Take 2,000 Units by mouth every morning.   ziprasidone 20 MG capsule Commonly known as: GEODON Take 20 mg by mouth 2 (two) times daily.        Discharge Assessment: Vitals:   07/07/22 0600 07/07/22 1303  BP: (!) 155/66 (!) 148/70  Pulse: 99 82  Resp: 18 18  Temp:    SpO2: 100% 100%   Skin clean, dry and intact without evidence of skin break down, no evidence of skin tears noted. IV catheter discontinued intact. Site without signs and symptoms of complications - no redness or edema noted at insertion site, patient denies c/o pain - only slight tenderness at site.  Dressing with slight pressure applied.  D/c Instructions-Education: Discharge instructions given to patient/family with verbalized understanding. D/c education completed with patient/family including follow up instructions, medication list, d/c activities limitations if indicated, with other d/c instructions as indicated by MD - patient able to verbalize understanding, all questions fully answered. Patient instructed to return to ED, call 911, or call MD for any changes in condition.  Patient escorted via [place], and D/C home via private auto.  Santa Lighter, RN 07/07/2022 1:48 PM

## 2022-07-07 NOTE — Plan of Care (Signed)
  Problem: Education: Goal: Knowledge of General Education information will improve Description Including pain rating scale, medication(s)/side effects and non-pharmacologic comfort measures Outcome: Progressing   

## 2022-07-07 NOTE — Progress Notes (Addendum)
Heather [name], M.D. Gastroenterology & Hepatology   Interval History:  No acute events overnight. Patient reports that her pain went away and was able to tolerate diet without any nausea or vomiting. Feels well and wants to go home.  Inpatient Medications:  Current Facility-Administered Medications:    0.9 %  sodium chloride infusion, , Intravenous, Continuous, Manuella Ghazi, Pratik D, DO, Last Rate: 125 mL/hr at 07/07/22 0319, Infusion Verify at 07/07/22 0319   acetaminophen (TYLENOL) tablet 650 mg, 650 mg, Oral, Q6H PRN, Adefeso, Oladapo, DO, 650 mg at 07/06/22 1716   atorvastatin (LIPITOR) tablet 80 mg, 80 mg, Oral, Daily, Adefeso, Oladapo, DO, 80 mg at 07/07/22 0834   busPIRone (BUSPAR) tablet 10 mg, 10 mg, Oral, TID PRN, Manuella Ghazi, Pratik D, DO, 10 mg at 07/06/22 1020   calcium carbonate (TUMS - dosed in mg elemental calcium) chewable tablet 400 mg of elemental calcium, 400 mg of elemental calcium, Oral, TID WC, Shah, Pratik D, DO, 400 mg of elemental calcium at 07/07/22 0836   dicyclomine (BENTYL) capsule 20 mg, 20 mg, Oral, BID, Adefeso, Oladapo, DO, 20 mg at 07/07/22 6503   diphenhydrAMINE-zinc acetate (BENADRYL) 2-0.1 % cream, , Topical, TID PRN, Manuella Ghazi, Pratik D, DO   feeding supplement (BOOST / RESOURCE BREEZE) liquid 1 Container, 1 Container, Oral, TID BM, Adefeso, Oladapo, DO   feeding supplement (GLUCERNA SHAKE) (GLUCERNA SHAKE) liquid 237 mL, 237 mL, Oral, TID BM, Adefeso, Oladapo, DO, 237 mL at 07/07/22 0843   heparin injection 5,000 Units, 5,000 Units, Subcutaneous, Q8H, Adefeso, Oladapo, DO, 5,000 Units at 07/07/22 0558   hydrALAZINE (APRESOLINE) tablet 50 mg, 50 mg, Oral, BID, Adefeso, Oladapo, DO, 50 mg at 07/07/22 0835   HYDROmorphone (DILAUDID) injection 0.5 mg, 0.5 mg, Intravenous, Q2H PRN, Manuella Ghazi, Pratik D, DO, 0.5 mg at 07/07/22 0612   insulin aspart (novoLOG) injection 0-5 Units, 0-5 Units, Subcutaneous, QHS, Adefeso, Oladapo, DO, 3 Units at 07/06/22 2210   insulin aspart  (novoLOG) injection 0-9 Units, 0-9 Units, Subcutaneous, TID WC, Adefeso, Oladapo, DO, 2 Units at 07/06/22 1718   insulin detemir (LEVEMIR) injection 10 Units, 10 Units, Subcutaneous, QHS, Adefeso, Oladapo, DO, 10 Units at 07/06/22 2155   levETIRAcetam (KEPPRA) tablet 500 mg, 500 mg, Oral, BID, Manuella Ghazi, Pratik D, DO, 500 mg at 07/07/22 0835   metoprolol succinate (TOPROL-XL) 24 hr tablet 100 mg, 100 mg, Oral, Daily, Adefeso, Oladapo, DO, 100 mg at 07/07/22 0834   ondansetron (ZOFRAN) tablet 4 mg, 4 mg, Oral, Q6H PRN, 4 mg at 07/06/22 1715 **OR** ondansetron (ZOFRAN) injection 4 mg, 4 mg, Intravenous, Q6H PRN, Adefeso, Oladapo, DO, 4 mg at 07/06/22 2330   ondansetron (ZOFRAN) injection 4 mg, 4 mg, Intravenous, Q6H PRN, Adefeso, Oladapo, DO, 4 mg at 07/06/22 1020   pantoprazole (PROTONIX) injection 40 mg, 40 mg, Intravenous, Q24H, Adefeso, Oladapo, DO, 40 mg at 07/06/22 2154   ziprasidone (GEODON) capsule 20 mg, 20 mg, Oral, BID, Manuella Ghazi, Pratik D, DO, 20 mg at 07/07/22 0843   I/O    Intake/Output Summary (Last 24 hours) at 07/07/2022 1020 Last data filed at 07/07/2022 0319 Gross per 24 hour  Intake 2266.17 ml  Output --  Net 2266.17 ml     Physical Exam: Temp:  [98.2 F (36.8 C)-98.9 F (37.2 C)] 98.9 F (37.2 C) (09/08 2000) Pulse Rate:  [97-99] 99 (09/09 0600) Resp:  [16-18] 18 (09/09 0600) BP: (105-170)/(60-74) 155/66 (09/09 0600) SpO2:  [98 %-100 %] 100 % (09/09 0600) Weight:  [53.9 kg] 53.9 kg (09/08 1100)  Temp (24hrs), Avg:98.5 F (36.9 C), Min:98.2 F (36.8 C), Max:98.9 F (37.2 C) GENERAL: The patient is AO x3, in no acute distress. HEENT: Head is normocephalic and atraumatic. EOMI are intact. Mouth is well hydrated and without lesions. NECK: Supple. No masses LUNGS: Clear to auscultation. No presence of rhonchi/wheezing/rales. Adequate chest expansion HEART: RRR, normal s1 and s2. ABDOMEN: Soft, nontender, no guarding, no peritoneal signs, and nondistended. BS +. No  masses. EXTREMITIES: Without any cyanosis, clubbing, rash, lesions or edema. NEUROLOGIC: AOx3, no focal motor deficit. SKIN: no jaundice, no rashes  Laboratory Data: CBC:     Component Value Date/Time   WBC 5.4 07/06/2022 0334   RBC 2.59 (L) 07/06/2022 0334   HGB 8.3 (L) 07/06/2022 0334   HCT 25.5 (L) 07/06/2022 0334   PLT 158 07/06/2022 0334   MCV 98.5 07/06/2022 0334   MCH 32.0 07/06/2022 0334   MCHC 32.5 07/06/2022 0334   RDW 14.5 07/06/2022 0334   LYMPHSABS 1.9 06/20/2022 0301   MONOABS 0.6 06/20/2022 0301   EOSABS 0.2 06/20/2022 0301   BASOSABS 0.1 06/20/2022 0301   COAG:  Lab Results  Component Value Date   INR 1.2 08/26/2021   INR 1.3 (H) 08/23/2021   INR 1.6 (H) 08/20/2021    BMP:     Latest Ref Rng & Units 07/06/2022    3:34 AM 07/05/2022    3:42 AM 07/04/2022    9:47 PM  BMP  Glucose 70 - 99 mg/dL 253  131    BUN 8 - 23 mg/dL 41  46    Creatinine 0.44 - 1.00 mg/dL 2.72  2.59  2.70   Sodium 135 - 145 mmol/L 129  131    Potassium 3.5 - 5.1 mmol/L 4.5  4.4    Chloride 98 - 111 mmol/L 100  103    CO2 22 - 32 mmol/L 20  21    Calcium 8.9 - 10.3 mg/dL 9.1  9.0      HEPATIC:     Latest Ref Rng & Units 07/06/2022    3:34 AM 07/05/2022    3:42 AM 07/04/2022    6:27 PM  Hepatic Function  Total Protein 6.5 - 8.1 g/dL 6.7  6.5  6.6   Albumin 3.5 - 5.0 g/dL 2.9  2.8  2.8   AST 15 - 41 U/L '15  15  13   ' ALT 0 - 44 U/L '15  15  16   ' Alk Phosphatase 38 - 126 U/L 102  93  96   Total Bilirubin 0.3 - 1.2 mg/dL 0.7  0.7  0.6   Bilirubin, Direct 0.0 - 0.2 mg/dL   0.1     CARDIAC:  Lab Results  Component Value Date   CKTOTAL 38 06/20/2022   TROPONINI 0.04 ([place]) 04/03/2017      Imaging: I personally reviewed and interpreted the available labs, imaging and endoscopic files.   Assessment/Plan: 71 y/o female with past medical history of small bowel Crohn's disease on Humira, history of pancreatitis possibly related to hypertriglyceridemia and use of Victoza, CKD, depression,  diabetes, GERD, anxiety, IBS, hyperlipidemia, stroke and history of salmonellosis, who came to the hospital after presenting acute severe abdominal pain radiating to her back.  Patient had changes on imaging consistent with acute pancreatitis, which was consistent with her abdominal pain distribution.  She has a had a multiple episode of pancreatitis in the past which were attributed to hypertriglyceridemia.  Most recent triglycerides have been normal.  However, she had  a previous history of Victoza induced pancreatitis.  Unfortunately, she was started recently on Ozempic and this is likely the culprit of her recurrent pancreatitis.  It is also possible that Humira may have led to this but this seems to be less likely.  Notably, she had a cholecystectomy in the past and does not drink any alcohol.  She had an MRCP showing an acute collection in the tail of the pancreas.  If she presents recurrent symptoms in the future she will need to have repeat imaging 4 weeks after initial presentation.  Patient was able to advance her diet adequately without any complaints and her pain has been controlled.  She will need to follow up with her primary gastroenterologist upon discharge and try to adhere for now with a low-fat diet.  Supportive measures. Advance diet as tolerated but keep with a low-fat diet. Stop GLP-1 Agonists due to potential cause of her pancreatitis.  If she has recurrent episodes of pancreatitis, may consider stopping Humira in the future and switching to another biological for her Crohn's disease Follow up with primary GI, Dr. Carlean Purl, as outpatient.   Heather Peppers, MD Gastroenterology and Hepatology [facility]

## 2022-07-09 ENCOUNTER — Telehealth: Payer: Self-pay | Admitting: Nurse Practitioner

## 2022-07-09 ENCOUNTER — Ambulatory Visit: Payer: Self-pay

## 2022-07-09 ENCOUNTER — Other Ambulatory Visit: Payer: Self-pay | Admitting: Nurse Practitioner

## 2022-07-09 ENCOUNTER — Ambulatory Visit (INDEPENDENT_AMBULATORY_CARE_PROVIDER_SITE_OTHER): Payer: 59 | Admitting: Nurse Practitioner

## 2022-07-09 ENCOUNTER — Encounter: Payer: Self-pay | Admitting: Nurse Practitioner

## 2022-07-09 VITALS — BP 186/79 | HR 101 | Ht 62.0 in | Wt 127.4 lb

## 2022-07-09 DIAGNOSIS — I10 Essential (primary) hypertension: Secondary | ICD-10-CM | POA: Diagnosis not present

## 2022-07-09 DIAGNOSIS — E782 Mixed hyperlipidemia: Secondary | ICD-10-CM | POA: Diagnosis not present

## 2022-07-09 DIAGNOSIS — E1121 Type 2 diabetes mellitus with diabetic nephropathy: Secondary | ICD-10-CM | POA: Diagnosis not present

## 2022-07-09 DIAGNOSIS — N184 Chronic kidney disease, stage 4 (severe): Secondary | ICD-10-CM

## 2022-07-09 DIAGNOSIS — E1100 Type 2 diabetes mellitus with hyperosmolarity without nonketotic hyperglycemic-hyperosmolar coma (NKHHC): Secondary | ICD-10-CM

## 2022-07-09 MED ORDER — INSULIN ASPART 100 UNIT/ML FLEXPEN: 5.0000 [IU] | PEN_INJECTOR | Freq: Three times a day (TID) | SUBCUTANEOUS | 3 refills | Status: DC

## 2022-07-09 NOTE — Telephone Encounter (Signed)
Pt has been d/c from hospital and will need a post hosp f/u as well as IN OFFICE PRE OP APPT. I will send a message to the [place] office to help with appt .

## 2022-07-09 NOTE — Progress Notes (Signed)
Endocrinology Follow Up Note       07/09/2022, 2:47 PM   Subjective:    Patient ID: Heather [name], female    DOB: 03-29-1951.  Heather [name] is being seen in follow up after being seen in consultation for management of currently uncontrolled symptomatic diabetes requested by  [name], MD.   Past Medical History:  Diagnosis Date   Acute renal failure superimposed on stage 3 chronic kidney disease ([place]) 01/21/2016   Anemia    Anxiety    B12 deficiency    Brain tumor (benign) ([place]) 10/29/2020   Cellulitis and abscess    Abdomen and buttocks   Chronic diastolic CHF (congestive heart failure) ([place]) 03/16/2022   CKD (chronic kidney disease) stage 3, GFR 30-59 ml/min (HCC)    Closed fracture of distal end of femur, unspecified fracture morphology, initial encounter ([place])    Coronary arteriosclerosis 07/15/2018   Crohn disease ([place])    Depression    Diabetic neuropathy (HCC)    feet   DJD (degenerative joint disease)    Right forminal stenosis C4-5   Essential hypertension    Femur fracture, right ([place]) 63/14/9702   Folliculitis    GERD (gastroesophageal reflux disease)    Gout    Headache(784.0)    Hiatal hernia    History of blood transfusion    History of bronchitis    History of cardiac catheterization    No significant CAD 2012   History of kidney stones    surgery to remove   Insomnia    Irritable bowel syndrome    Mixed hyperlipidemia    Osteoarthritis    Palpitations    Pneumonia    Reflux esophagitis    Salmonella    Stroke [facility])    Tinnitus    Right   Type 2 diabetes mellitus ([place])     Past Surgical History:  Procedure Laterality Date   BACK SURGERY     c-spine surgery     03/2007   CARDIAC CATHETERIZATION     CHOLECYSTECTOMY     COLONOSCOPY  04/21/2011; 05/29/11   6/12: [place] - ?AVM at IC valve, inflammatory changes at IC valve [facility]); 7/12 -  [place]; IC valve erosions, look chronic and probably Crohn's per path   colonoscopy  2017   [place]: random biopsies normal. TI normal   ESOPHAGOGASTRODUODENOSCOPY  05/29/11   normal   ESOPHAGOGASTRODUODENOSCOPY N/A 01/21/2016   Dr. Michail Sermon: normal   EYE SURGERY Bilateral    cataracts removed   FEMUR IM NAIL Right 04/10/2018   Procedure: INTRAMEDULLARY (IM) NAIL FEMORAL;  Surgeon: Paralee Cancel, MD;  Location: [facility];  Service: Orthopedics;  Laterality: Right;   FRACTURE SURGERY     GIVENS CAPSULE STUDY  11/17/2020   HARDWARE REMOVAL Right 04/10/2018   Procedure: HARDWARE REMOVAL RIGHT DISTAL FEMUR;  Surgeon: Paralee Cancel, MD;  Location: [facility];  Service: Orthopedics;  Laterality: Right;   HERNIA REPAIR     IR FLUORO GUIDE CV LINE RIGHT  04/01/2017   IR US GUIDE VASC ACCESS RIGHT  04/01/2017   JOINT REPLACEMENT Right    hip   OPEN REDUCTION INTERNAL FIXATION (ORIF) DISTAL RADIAL FRACTURE Right  05/28/2019   Procedure: OPEN REDUCTION INTERNAL FIXATION (ORIF) DISTAL RADIAL FRACTURE;  Surgeon: Verner Mould, MD;  Location: [place];  Service: Orthopedics;  Laterality: Right;  with block 90 minutes   ORIF FEMUR FRACTURE Right 03/31/2017   Procedure: OPEN REDUCTION INTERNAL FIXATION (ORIF) DISTAL FEMUR FRACTURE;  Surgeon: Paralee Cancel, MD;  Location: [place];  Service: Orthopedics;  Laterality: Right;   removal of kidney stone     Right knee arthroscopic surgery     TONSILLECTOMY     TOTAL ABDOMINAL HYSTERECTOMY      Social History   Socioeconomic History   Marital status: Married    Spouse name: Not on file   Number of children: 2   Years of education: Not on file   Highest education level: Not on file  Occupational History   Occupation: Disabled  Tobacco Use   Smoking status: Never   Smokeless tobacco: Never  Vaping Use   Vaping Use: Never used  Substance and Sexual Activity   Alcohol use: No   Drug use: No   Sexual activity: Not on file    Comment: Hysterectomy  Other  Topics Concern   Not on file  Social History Narrative   Patient is married 2 children she is disabled, 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    Social Determinants of Health   Financial Resource Strain: Not on file  Food Insecurity: No Food Insecurity (07/04/2022)   Hunger Vital Sign    Worried About Running Out of Food in the Last Year: Never true    Ran Out of Food in the Last Year: Never true  Transportation Needs: No Transportation Needs (07/04/2022)   PRAPARE - Hydrologist (Medical): No    Lack of Transportation (Non-Medical): No  Physical Activity: Not on file  Stress: Not on file  Social Connections: Not on file    Family History  Problem Relation Age of Onset   Diabetes Mother    Colon cancer Brother        POSSIBLY. Patient states diagnosed at age 81, then later states age 29. unclear and limited historian   Esophageal cancer Neg Hx    Rectal cancer Neg Hx    Stomach cancer Neg Hx     Outpatient Encounter Medications as of 07/09/2022  Medication Sig   acetaminophen (TYLENOL) 325 MG tablet Take 2 tablets (650 mg total) by mouth every 4 (four) hours.   allopurinol (ZYLOPRIM) 100 MG tablet Take 100 mg by mouth daily.   Ascorbic Acid (VITAMIN C WITH ROSE HIPS) 500 MG tablet Take 500 mg by mouth every morning.   atorvastatin (LIPITOR) 80 MG tablet Take 1 tablet (80 mg total) by mouth daily. Restart on 06/29/22   busPIRone (BUSPAR) 10 MG tablet Take 10 mg by mouth 3 (three) times daily as needed for anxiety.   calcitRIOL (ROCALTROL) 0.25 MCG capsule Take 0.25 mcg by mouth 3 (three) times a week. Monday,Wed and Fri.   Cholecalciferol (VITAMIN D) 2000 units CAPS Take 2,000 Units by mouth every morning.    cyanocobalamin (,VITAMIN B-12,) 1000 MCG/ML injection Inject 1,000 mcg  into the muscle every 30 (thirty) days.     dicyclomine (BENTYL) 20 MG tablet Take  1 tablet (20 mg total) by mouth in the morning and at bedtime.   diphenhydrAMINE (BENADRYL) 2 % cream Apply topically 3 (three) times daily as needed for itching.   esomeprazole (NEXIUM) 40 MG capsule Take 40 mg by mouth daily before breakfast.     furosemide (LASIX) 40 MG tablet Take 1 tablet (40 mg total) by mouth daily.   gabapentin (NEURONTIN) 300 MG capsule Take 1 capsule (300 mg total) by mouth at bedtime.   hydrALAZINE (APRESOLINE) 50 MG tablet Take 1 tablet (50 mg total) by mouth 2 (two) times daily.   insulin detemir (LEVEMIR FLEXPEN) 100 UNIT/ML FlexPen Inject 10 Units into the skin at bedtime.   levETIRAcetam (KEPPRA) 500 MG tablet Take 500 mg by mouth 2 (two) times daily.   metoprolol succinate (TOPROL XL) 100 MG 24 hr tablet Take 1 tablet (100 mg total) by mouth daily. Take with or immediately following a meal.   temazepam (RESTORIL) 15 MG capsule Take 15 mg by mouth at bedtime as needed.   tiZANidine (ZANAFLEX) 4 MG tablet Take 4 mg by mouth 2 (two) times daily.   valsartan (DIOVAN) 80 MG tablet Take 80 mg by mouth daily.   insulin aspart (NOVOLOG) 100 UNIT/ML FlexPen Inject 5-11 Units into the skin 3 (three) times daily with meals.   ziprasidone (GEODON) 20 MG capsule Take 20 mg by mouth 2 (two) times daily.   [DISCONTINUED] insulin aspart (NOVOLOG) 100 UNIT/ML FlexPen Inject 0-12 Units into the skin 3 (three) times daily with meals. insulin aspart (novoLOG) injection 0-12 Units 0-12 Units Subcutaneous, 3 times daily with meals CBG < 70: Implement Hypoglycemia Standing Orders and refer to Hypoglycemia Standing Orders sidebar report  CBG 70 - 120: 0 unit CBG 121 - 150: 0 unit  CBG 151 - 200: 2 unit CBG 201 - 250: 4 units CBG 251 - 300: 6 units CBG 301 - 350: 8 units  CBG 351 - 400: 10 units  CBG > 400: 12 units (Patient not taking: Reported on 07/09/2022)   No facility-administered encounter medications  on file as of 07/09/2022.    ALLERGIES: Allergies  Allergen Reactions   Cozaar [Losartan] Nausea And Vomiting, Swelling and Rash   Quinolones Itching, Nausea And Vomiting, Nausea Only and Swelling    Swelling of face, jaw, and lips    Cymbalta [Duloxetine Hcl] Itching and Swelling   Sulfa Antibiotics Nausea And Vomiting and Swelling    Headache  Swelling of feet, legs    Synalar [Fluocinolone] Swelling   Cipro [Ciprofloxacin Hcl] Nausea And Vomiting and Swelling    Swelling of face, jaw, lips   Diovan [Valsartan] Nausea And Vomiting and Swelling   Glucophage [Metformin] Other (See Comments)    GI upset   Ketalar [Ketamine] Swelling   Norco [Hydrocodone-Acetaminophen] Itching and Swelling   Norvasc [Amlodipine Besylate] Itching and Swelling    Fluid retention    Nsaids Nausea And Vomiting and Other (See Comments)    Has bleeding ulcers   Tape Itching and Rash    Paper tape can only be used on this patient   Zestril [Lisinopril] Swelling and Rash    Oral swelling Red streaks on arms/legs    VACCINATION STATUS: Immunization History  Administered Date(s) Administered   Influenza,inj,Quad PF,6+ Mos 12/26/2016   Pneumococcal Polysaccharide-23 12/26/2016    Diabetes She presents for her follow-up diabetic visit. She has type 2 diabetes mellitus. Onset time: diagnosed at approx age of 82. Her disease course has been fluctuating. Hypoglycemia symptoms  include nervousness/anxiousness, seizures, sweats and tremors. Pertinent negatives for hypoglycemia include no headaches. Associated symptoms include weight loss. Pertinent negatives for diabetes include no blurred vision, no fatigue, no polydipsia, no polyuria and no weakness. (Was hospitalized in between visits for multiple falls thought to be secondary to UTI, but hypoglycemia could have also been a factor.) Symptoms are stable. Diabetic complications include heart disease, nephropathy and peripheral neuropathy. Risk factors for  coronary artery disease include diabetes mellitus, dyslipidemia, family history, hypertension, post-menopausal, sedentary lifestyle and tobacco exposure. When asked about current treatments, none (She stopped taking her insulin as it was causing her to drop into the 30s during the night) were reported. Her weight is decreasing rapidly. She is following a generally healthy diet. When asked about meal planning, she reported none. She has had a previous visit with a dietitian (has seen one in the past). She rarely participates in exercise. Her overall blood glucose range is >200 mg/dl. (She presents today with her meter showing inconsistent glucose monitoring pattern.  Her most recent A1c on 8/23 was 10%.  She was recently hospitalized for pancreatitis secondary to [place], previously prescribed by her PCP.  I fear she has not been taking her medications properly as she had trouble telling me consistently which medication and how much she was taking.  Analysis of her meter shows 14-day average of 443 (6 readings); 30-day average of 449 (23 readings).) An ACE inhibitor/angiotensin II receptor blocker is contraindicated (reported allergy to ACE and ARBs). She does not see a podiatrist.Eye exam is current.  Hypertension This is a chronic problem. The current episode started more than 1 year ago. The problem has been gradually improving since onset. The problem is uncontrolled. Associated symptoms include malaise/fatigue and sweats. Pertinent negatives include no blurred vision, headaches or shortness of breath. There are no associated agents to hypertension. Risk factors for coronary artery disease include diabetes mellitus, dyslipidemia, family history, post-menopausal state and sedentary lifestyle. Past treatments include diuretics, beta blockers and alpha 1 blockers. The current treatment provides mild improvement. Compliance problems include diet and exercise.  Hypertensive end-organ damage includes kidney disease  and CAD/MI. Identifiable causes of hypertension include chronic renal disease.  Hyperlipidemia This is a chronic problem. The problem is uncontrolled. Recent lipid tests were reviewed and are high. Exacerbating diseases include chronic renal disease and diabetes. Factors aggravating her hyperlipidemia include beta blockers and fatty foods. Pertinent negatives include no shortness of breath. Current antihyperlipidemic treatment includes statins and fibric acid derivatives. The current treatment provides mild improvement of lipids. Compliance problems include adherence to diet and adherence to exercise.  Risk factors for coronary artery disease include diabetes mellitus, dyslipidemia, family history, hypertension, post-menopausal and a sedentary lifestyle.     Review of systems  Constitutional: + rapidly decreasing body weight,  current Body mass index is 23.3 kg/m. , no fatigue, no subjective hyperthermia, no subjective hypothermia, + poor appetite Eyes: no blurry vision, no xerophthalmia ENT: no sore throat, no nodules palpated in throat, no dysphagia/odynophagia, no hoarseness Cardiovascular: no chest pain, no shortness of breath, no palpitations, no leg swelling Respiratory: no cough, no shortness of breath Gastrointestinal: no nausea/vomiting/diarrhea Musculoskeletal: no muscle/joint aches, been hospitalized recently for multiple falls Skin: no rashes, no hyperemia Neurological: no tremors, no numbness, no tingling, no dizziness Psychiatric: no depression, no anxiety  Objective:     BP (!) 186/79 (BP Location: Left Arm, Patient Position: Sitting, Cuff Size: Normal)   Pulse (!) 101   Ht '5\' 2"'$  (1.575 m)  Wt 127 lb 6.4 oz (57.8 kg)   LMP  (LMP Unknown)   BMI 23.30 kg/m   Wt Readings from Last 3 Encounters:  07/09/22 127 lb 6.4 oz (57.8 kg)  07/06/22 118 lb 13.3 oz (53.9 kg)  06/20/22 120 lb 9.5 oz (54.7 kg)     BP Readings from Last 3 Encounters:  07/09/22 (!) 186/79  07/07/22  (!) 148/70  06/22/22 (!) 172/60       Physical Exam- Limited  Constitutional:  Body mass index is 23.3 kg/m. , not in acute distress, ? Memory deficits? Eyes:  EOMI, no exophthalmos Neck: Supple Cardiovascular: RRR, no murmurs, rubs, or gallops, no edema Respiratory: Adequate breathing efforts, no crackles, rales, rhonchi, or wheezing Musculoskeletal: no gross deformities, strength intact in all four extremities, no gross restriction of joint movements Skin:  no rashes, no hyperemia Neurological: no tremor with outstretched hands    CMP ( most recent) CMP     Component Value Date/Time   NA 133 (L) 07/07/2022 1223   NA 143 06/17/2017 1220   K 5.3 (H) 07/07/2022 1223   CL 109 07/07/2022 1223   CO2 17 (L) 07/07/2022 1223   GLUCOSE 256 (H) 07/07/2022 1223   BUN 34 (H) 07/07/2022 1223   BUN 34 (H) 06/17/2017 1220   CREATININE 2.13 (H) 07/07/2022 1223   CALCIUM 8.7 (L) 07/07/2022 1223   CALCIUM 9.9 05/17/2022 1332   PROT 6.7 07/06/2022 0334   PROT 7.0 06/17/2017 1220   ALBUMIN 2.9 (L) 07/06/2022 0334   ALBUMIN 4.4 06/17/2017 1220   AST 15 07/06/2022 0334   ALT 15 07/06/2022 0334   ALKPHOS 102 07/06/2022 0334   BILITOT 0.7 07/06/2022 0334   BILITOT 0.3 06/17/2017 1220   GFRNONAA 25 (L) 07/07/2022 1223   GFRAA 56 (L) 05/19/2020 0640     Diabetic Labs (most recent): Lab Results  Component Value Date   HGBA1C 10.0 (H) 06/20/2022   HGBA1C 5.6 01/18/2022   HGBA1C 6.1 (H) 08/27/2021     Lipid Panel ( most recent) Lipid Panel     Component Value Date/Time   CHOL 115 06/21/2022 0607   CHOL 405 (H) 08/22/2020 1426   TRIG 116 06/21/2022 0607   HDL 44 06/21/2022 0607   HDL 30 (L) 08/22/2020 1426   CHOLHDL 2.6 06/21/2022 0607   VLDL 23 06/21/2022 0607   LDLCALC 48 06/21/2022 0607   LDLCALC Comment (A) 08/22/2020 1426   LDLDIRECT 117.9 (H) 05/04/2021 1150   LABVLDL Comment (A) 08/22/2020 1426      Lab Results  Component Value Date   TSH 0.734 08/20/2021   TSH  3.426 08/17/2021   TSH 2.19 03/10/2020   TSH 0.78 08/06/2018   TSH 1.489 12/25/2016           Assessment & Plan:   1) Uncontrolled Type 2 Diabetes with nephropathy  She presents today with her meter showing inconsistent glucose monitoring pattern.  Her most recent A1c on 8/23 was 10%.  She was recently hospitalized for pancreatitis secondary to [place], previously prescribed by her PCP.  I fear she has not been taking her medications properly as she had trouble telling me consistently which medication and how much she was taking.  Analysis of her meter shows 14-day average of 443 (6 readings); 30-day average of 449 (23 readings).  - NEAVEH BELANGER has currently uncontrolled symptomatic type 2 DM since 71 years of age.  -Recent labs reviewed.  - I had a long discussion with her  about the progressive nature of diabetes and the pathology behind its complications. -her diabetes is complicated by CKD, CAD, neuropathy, and pancreatitis and she remains at a high risk for more acute and chronic complications which include CAD, CVA, CKD, retinopathy, and neuropathy. These are all discussed in detail with her.  - Nutritional counseling repeated at each appointment due to patients tendency to fall back in to old habits.  - The patient admits there is a room for improvement in their diet and drink choices. -  Suggestion is made for the patient to avoid simple carbohydrates from their diet including Cakes, Sweet Desserts / Pastries, Ice Cream, Soda (diet and regular), Sweet Tea, Candies, Chips, Cookies, Sweet Pastries, Store Bought Juices, Alcohol in Excess of 1-2 drinks a day, Artificial Sweeteners, Coffee Creamer, and "Sugar-free" Products. This will help patient to have stable blood glucose profile and potentially avoid unintended weight gain.   - I encouraged the patient to switch to unprocessed or minimally processed complex starch and increased protein intake (animal or plant source),  fruits, and vegetables.   - Patient is advised to stick to a routine mealtimes to eat 3 meals a day and avoid unnecessary snacks (to snack only to correct hypoglycemia).  - I have approached her with the following individualized plan to manage  her diabetes and patient agrees:   -She is advised to increase her Levemir to 20 units SQ nightly and adjust her Novolog insulin to 5-11 units TID with meals if glucose is above 90 and she is eating (Specific instructions on how to titrate insulin dosage based on glucose readings given to patient in writing).  She did demonstrate her ability to properly dose her insulin per the SSI chart with me today.   -she is encouraged to consistently monitor glucose 4 times daily, before meals and before bed, and to call the clinic if she has readings less than 70 or above 300 for 3 tests in a row.  She could benefit from CGM device, was previously on Dexcom G6 but went into DKA allegedly due to sensor malfunction, therefore wishes not to try that one again.  I sent in for [facility] with reader to [employer] for her as her phone is not compatible with the [place] apps.  - she is warned not to take insulin without proper monitoring per orders. - Adjustment parameters are given to her for hypo and hyperglycemia in writing.  -She reportedly does not tolerate Metformin due to GI side effects and CKD.  -She was on Ozempic (prescribed by another provider) but developed pancreatitis, therefore she is not a good candidate for incretin therapies.  - Specific targets for  A1c;  LDL, HDL,  and Triglycerides were discussed with the patient.  2) Blood Pressure /Hypertension:  her blood pressure is still not controlled.  she is advised to continue her current medications including Diovan 80 mg po daily, Metoprolol 100 mg p.o. daily, Hydralazine 50 mg po BID, and Lasix 40 mg po every other day.  3) Lipids/Hyperlipidemia:    Review of her recent lipid panel from 06/21/22 showed  controlled LD of 48.  She is advised to continue Lipitor 80 mg po daily.  Side effects and precautions discussed with her.  4)  Weight/Diet:  her Body mass index is 23.3 kg/m.  -   she is not a candidate for weight loss.  Exercise, and detailed carbohydrates information provided  -  detailed on discharge instructions.  5) Chronic Care/Health Maintenance: -she is  on Statin medications and is encouraged to initiate and continue to follow up with Ophthalmology, Dentist, Podiatrist at least yearly or according to recommendations, and advised to stay away from smoking. I have recommended yearly flu vaccine and pneumonia vaccine at least every 5 years; moderate intensity exercise for up to 150 minutes weekly; and sleep for at least 7 hours a day.  - she is advised to maintain close follow up with [name], MD for primary care needs, as well as her other providers for optimal and coordinated care.     I spent 49 minutes in the care of the patient today including review of labs from [place], Lipids, Thyroid Function, Hematology (current and previous including abstractions from other facilities); face-to-face time discussing  her blood glucose readings/logs, discussing hypoglycemia and hyperglycemia episodes and symptoms, medications doses, her options of short and long term treatment based on the latest standards of care / guidelines;  discussion about incorporating lifestyle medicine;  and documenting the encounter. Risk reduction counseling performed per USPSTF guidelines to reduce obesity and cardiovascular risk factors.     Please refer to Patient Instructions for Blood Glucose Monitoring and Insulin/Medications Dosing Guide"  in media tab for additional information. Please  also refer to " Patient Self Inventory" in the Media  tab for reviewed elements of pertinent patient history.  [name] participated in the discussions, expressed understanding, and voiced agreement with the above plans.   All questions were answered to her satisfaction. she is encouraged to contact clinic should she have any questions or concerns prior to her return visit.   Follow up plan: - Return in about 1 month (around 08/08/2022) for Diabetes F/U, Bring meter and logs.  Rayetta Pigg, [facility] [address] [place], [place] [address] Phone: [phone] Fax: [phone]  07/09/2022, 2:47 PM

## 2022-07-09 NOTE — Telephone Encounter (Signed)
Called pt to let her know Whitney called in the [place] 2 because it has the device with it. The [place] 3 is not compatible with her phone

## 2022-07-09 NOTE — Patient Outreach (Signed)
Heather [name] 1951-05-13 [id number]   Primary Care Provider: [name], MD with [facility]  Insurance:  [facility]  Chart review was perform for extreme high risk score for unplanned readmission risk  Plan:  Referral request for [place] Coordination follow up.  Natividad Brood, RN BSN [facility] Liaison  [phone] business mobile phone Toll free office [phone]  Fax number: [phone] [email or web address]

## 2022-07-10 ENCOUNTER — Telehealth: Payer: Self-pay | Admitting: *Deleted

## 2022-07-10 ENCOUNTER — Telehealth: Payer: Self-pay

## 2022-07-10 NOTE — Telephone Encounter (Signed)
-----   Message from Gatha Mayer, MD sent at 07/08/2022  8:05 PM EDT ----- Regarding: RE: follow up hospitalization Thanks for the follow-up and care   We will schedule a follow-up.  Remo Lipps please arrange next available APP appointment .  Glendell Docker ----- Message ----- From: Montez Morita, Quillian Quince, MD Sent: 07/07/2022  11:31 AM EDT To: Gatha Mayer, MD Subject: follow up hospitalization                      Hi Dr. Carlean Purl, [facility] this finds you well. Want to keep you in the loop about this patient's medical condition.  She was admitted to our hospital due to recurrent pancreatitis.  We considered this was related to the use of Ozempic as she had similar presentation in the past after taking Victoza. I advised her to follow-up with you in the near term. Thanks  Maylon Peppers, MD Gastroenterology and Hepatology [facility]

## 2022-07-10 NOTE — Chronic Care Management (AMB) (Signed)
  Care Coordination  Outreach Note  07/10/2022 Name: Heather [name] MRN: [id number] DOB: [birth year]   Care Coordination Outreach Attempts: An unsuccessful telephone outreach was attempted today to offer the patient information about available care coordination services as a benefit of their health plan.   Referral received   Follow Up Plan:  Additional outreach attempts will be made to offer the patient care coordination information and services.   Encounter Outcome:  No Answer  Julian Hy, [place] Direct Dial: [phone]

## 2022-07-10 NOTE — Telephone Encounter (Unsigned)
Left message for pt to call back  °

## 2022-07-11 ENCOUNTER — Other Ambulatory Visit: Payer: Self-pay | Admitting: Nurse Practitioner

## 2022-07-11 NOTE — Telephone Encounter (Signed)
Left message for pt to call back  °

## 2022-07-11 NOTE — Telephone Encounter (Signed)
Pt was made aware of Dr. Carlean Purl recommendations: Pt was scheduled for an office visit with Vicie Mutters 08/06/2022 at 1:30 PM: Pt made aware: Pt verbalized understanding with all questions answered.

## 2022-07-12 ENCOUNTER — Encounter (HOSPITAL_COMMUNITY)
Admission: RE | Admit: 2022-07-12 | Discharge: 2022-07-12 | Disposition: A | Discharge status: Home / Self Care | Payer: 59 | Source: Ambulatory Visit | Attending: Nephrology | Admitting: Nephrology

## 2022-07-12 ENCOUNTER — Encounter (HOSPITAL_COMMUNITY): Payer: Self-pay

## 2022-07-12 DIAGNOSIS — N184 Chronic kidney disease, stage 4 (severe): Secondary | ICD-10-CM | POA: Diagnosis not present

## 2022-07-12 DIAGNOSIS — N189 Chronic kidney disease, unspecified: Secondary | ICD-10-CM | POA: Diagnosis present

## 2022-07-12 DIAGNOSIS — N179 Acute kidney failure, unspecified: Secondary | ICD-10-CM | POA: Diagnosis present

## 2022-07-12 LAB — POCT HEMOGLOBIN-HEMACUE: Hemoglobin: 7.5 g/dL — ABNORMAL LOW (ref 12.0–15.0)

## 2022-07-12 MED ORDER — EPOETIN ALFA-EPBX 3000 UNIT/ML IJ SOLN
3000.0000 [IU] | Freq: Once | INTRAMUSCULAR | Status: AC
  Administered 2022-07-12: 3000 [IU] via SUBCUTANEOUS

## 2022-07-12 MED ORDER — EPOETIN ALFA-EPBX 3000 UNIT/ML IJ SOLN
INTRAMUSCULAR | Status: AC
  Filled 2022-07-12: qty 1

## 2022-07-12 NOTE — Telephone Encounter (Signed)
Will address at clinic visit.   Alizaya Oshea S Kassim Guertin, NP  

## 2022-07-12 NOTE — Telephone Encounter (Signed)
I have reached out to this PT several times with no answer and left Vms several times. I went ahead and scheduled her w/Caitlyn Walker '@[email or web address]'$  for 07/31/22. I left a message w/ Appt date, time, and location.   Estill Bamberg      I will send notes to [employer] for upcoming appt as well as send FYI to requesting office.,

## 2022-07-12 NOTE — Patient Outreach (Signed)
[name] 1950-12-03 [id number]   [place] Organization [ACO] Patient: [facility] Medicare  Primary Care Provider: [name], MD with [facility] Internal Medicine  Referral request:  [place] Readmission Report  Chart review and referral request for [place] Coordinator still in process.  Plan: Report to be sent.  Natividad Brood, RN BSN [place]  [phone] business mobile phone Toll free office [phone]  *[place]  [phone] Fax number: [phone] [email or web address]

## 2022-07-12 NOTE — Chronic Care Management (AMB) (Signed)
  Care Coordination   Note   07/12/2022 Name: Heather [name] MRN: [id number] DOB: Dec 28, 1950  Heather [name] is a 71 y.o. year old female who sees [name], MD for primary care. I reached out to [name] by phone today to offer care coordination services.  [name] was given information about Care Coordination services today including:   The Care Coordination services include support from the care team which includes your Nurse Coordinator, Clinical Social Worker, or Pharmacist.  The Care Coordination team is here to help remove barriers to the health concerns and goals most important to you. Care Coordination services are voluntary, and the patient may decline or stop services at any time by request to their care team member.   Care Coordination Consent Status: Patient agreed to services and verbal consent obtained.   Follow up plan:  Telephone appointment with care coordination team member scheduled for:  07/16/2022  Encounter Outcome:  Pt. Scheduled from referral   Julian Hy, [place] Direct Dial: [phone]

## 2022-07-16 ENCOUNTER — Encounter: Payer: Self-pay | Admitting: *Deleted

## 2022-07-16 ENCOUNTER — Telehealth: Payer: Self-pay | Admitting: *Deleted

## 2022-07-16 NOTE — Chronic Care Management (AMB) (Unsigned)
  Care Coordination   Note   07/16/2022 Name: Heather [name] MRN: [id number] DOB: 1950-11-18  Heather [name] is a 71 y.o. year old female who sees [name], MD for primary care. I reached out to [name] by phone today to reschedule initial call with [facility]  care coordination services.   Follow up plan:  Unsuccessful telephone outreach attempt made. A HIPAA compliant phone message was left for the patient providing contact information and requesting a return call.  Encounter Outcome:  No Answer  [place]  Direct Dial: [phone]

## 2022-07-17 NOTE — Chronic Care Management (AMB) (Signed)
  Care Coordination   Note   07/17/2022 Name: Heather [name] MRN: [id number] DOB: 25-May-1951  Heather [name] is a 71 y.o. year old female who sees [name], MD for primary care. I reached out to [name] by phone today to reschedule call with [facility] care coordination services.    Follow up plan:  Telephone appointment with care coordination team member scheduled for:  07/18/22  Encounter Outcome:  Pt. Scheduled

## 2022-07-18 ENCOUNTER — Encounter: Payer: Self-pay | Admitting: *Deleted

## 2022-07-20 ENCOUNTER — Ambulatory Visit: Payer: Self-pay | Admitting: *Deleted

## 2022-07-20 NOTE — Patient Outreach (Signed)
  Care Coordination   07/20/2022 Name: Heather [name] MRN: [id number] DOB: [birth year]   Care Coordination Outreach Attempts:  An unsuccessful telephone outreach was attempted for a scheduled appointment today.  Follow Up Plan:  Additional outreach attempts will be made to offer the patient care coordination information and services.   Encounter Outcome:  No Answer  Care Coordination Interventions Activated:  No   Care Coordination Interventions:  No, not indicated    Valente David, RN, MSN, [facility] Care Management Care Management Coordinator [phone]

## 2022-07-24 ENCOUNTER — Telehealth: Payer: Self-pay | Admitting: *Deleted

## 2022-07-24 NOTE — Chronic Care Management (AMB) (Signed)
  Care Coordination   Note   07/24/2022 Name: Heather [name] MRN: [id number] DOB: 1951/10/24  Heather [name] is a 71 y.o. year old female who sees [name], MD for primary care. I reached out to [name] by phone today to offer care coordination services.  Rescheduling initial referral   [name] was given information about Care Coordination services today including:   The Care Coordination services include support from the care team which includes your Nurse Coordinator, Clinical Social Worker, or Pharmacist.  The Care Coordination team is here to help remove barriers to the health concerns and goals most important to you. Care Coordination services are voluntary, and the patient may decline or stop services at any time by request to their care team member.   Care Coordination Consent Status: Patient did not agree to participate in care coordination services at this time.  Follow up plan:  pt says she has upcoming appointment with Dr Manuella Ghazi and declined to reschedule at this time   Encounter Outcome:  Pt. Refused  Julian Hy, [place] Direct Dial: [phone]

## 2022-07-26 ENCOUNTER — Encounter (HOSPITAL_COMMUNITY)
Admission: RE | Admit: 2022-07-26 | Discharge: 2022-07-26 | Disposition: A | Discharge status: Home / Self Care | Payer: 59 | Source: Ambulatory Visit | Attending: Nephrology | Admitting: Nephrology

## 2022-07-26 DIAGNOSIS — N189 Chronic kidney disease, unspecified: Secondary | ICD-10-CM

## 2022-07-26 DIAGNOSIS — N184 Chronic kidney disease, stage 4 (severe): Secondary | ICD-10-CM

## 2022-07-30 NOTE — Progress Notes (Unsigned)
Cardiology Office Note:    Date:  08/01/2022   ID:  Heather [name], DOB Apr 09, 1951, MRN [id number]  PCP:  [name], [place] [facility] Cardiologist:  Carlyle Dolly, MD     Referring MD: [name], MD   CC: Here for hospital follow-up and preoperative cardiovascular risk assessment  History of Present Illness:    Heather [name] is a 71 y.o. female with a hx of the following:   Carotid artery disease  HLD Coronary arteriosclerosis Chronic diastolic CHF HTN Pulmonary HTN Hx of CVA GERD Hiatal hernia Crohn's disease Chronic pancreatitis T2DM BPPV CKD stage 4 (Follows Nephrology) Personal history of failed moderate sedation Depression   Previous cardiovascular history includes chronic chest pain, normal catheterizations in 2003, 2005, and 2012.  Nuclear stress test in 2010 and 2014 were both negative.  Echo in 2022 revealed LVEF 60 to 65%, no RWMA, mildly elevated pulmonary artery systolic pressure, mild MR, trivial TR, estimated RVSP 37.2 mmHg, otherwise normal findings.  Previous history of syncope, was seen by [facility] cardiology and received work-up.  Cardiac monitor that was arranged and was negative.  Furthermore, she experienced palpitations and in 2018 wore an event monitor that did not reveal any arrhythmias.  She is followed by nephrology for history of CKD stage IV, on labetalol that has been increased.  Has multiple allergies to several antihypertensives, hydralazine was stopped in 2022 due to issues with hypotension in setting of pancreatitis and diarrhea.  Was admitted for KDXIP-[id number] in 2505, complicated by DKA.  BP was well controlled at last OV with Dr. Carlyle Dolly on April 28, 2021.  She reported chronic mild symptoms of palpitations, was advised to continue beta-blocker.  Overall was doing well from a cardiac perspective.  Most recently, she presented to the ED on July 04, 2022 with chief complaint of back pain, presented  with 3-day onset of left flank pain that radiated to left upper quadrant and epigastric area.  Associated symptoms included N/V.  Patient also reported 3 unwitnessed falls due to weakness, reported chronic double vision.  CT of chest, abdomen, and pelvis revealed acute pancreatitis, no acute pulmonary findings, complex right humeral head and neck fracture, aortic atherosclerosis and coronary artery atherosclerosis.  Was admitted for further evaluation and treatment due to recurrent acute on chronic pancreatitis.  GI was consulted and recommended to discontinue Ozempic that was likely causing recurrence of pancreatitis.  She was told to follow-up with GI and PCP after discharge.  Today she presents for hospital follow-up and preoperative cardiovascular risk assessment.  She is scheduled to have right reverse shoulder arthroplasty by Dr. Justice Britain with [facility].  Date of surgery is TBD.  Type of clearance requested: No medications are listed as needing to be held.  Surgery will be done under general anesthesia.  Today she states she is doing well.  Denies any chest pain, shortness of breath, palpitations, syncope, presyncope, dizziness, recurrent falls, orthopnea, PND, swelling, bleeding, or claudication.  States blood pressure readings at home are labile.  She is able to complete 4 METS of activity, and prior to right shoulder injury, she was able to perform heavy housework and yard work.  Denies any issues of bleeding or any cardiac complaints or concerns.  Denies any other questions or concerns today. [facility] for hx of CKD stage 4.   Past Medical History:  Diagnosis Date   Acute renal failure superimposed on stage 3 chronic kidney disease ([place]) 01/21/2016   Anemia  Anxiety    B12 deficiency    Brain tumor (benign) ([place]) 10/29/2020   Cellulitis and abscess    Abdomen and buttocks   Chronic diastolic CHF (congestive heart failure) ([place]) 03/16/2022   CKD (chronic kidney disease) stage  3, GFR 30-59 ml/min (HCC)    Closed fracture of distal end of femur, unspecified fracture morphology, initial encounter ([place])    Coronary arteriosclerosis 07/15/2018   Crohn disease ([place])    Depression    Diabetic neuropathy (HCC)    feet   DJD (degenerative joint disease)    Right forminal stenosis C4-5   Essential hypertension    Femur fracture, right ([place]) 16/04/3709   Folliculitis    GERD (gastroesophageal reflux disease)    Gout    Headache(784.0)    Hiatal hernia    History of blood transfusion    History of bronchitis    History of cardiac catheterization    No significant CAD 2012   History of kidney stones    surgery to remove   Insomnia    Irritable bowel syndrome    Mixed hyperlipidemia    Osteoarthritis    Palpitations    Pneumonia    Reflux esophagitis    Salmonella    Stroke [facility])    Tinnitus    Right   Type 2 diabetes mellitus ([place])     Past Surgical History:  Procedure Laterality Date   BACK SURGERY     c-spine surgery     03/2007   CARDIAC CATHETERIZATION     CHOLECYSTECTOMY     COLONOSCOPY  04/21/2011; 05/29/11   6/12: [place] - ?AVM at IC valve, inflammatory changes at IC valve [facility]); 7/12 - [facility]; IC valve erosions, look chronic and probably Crohn's per path   colonoscopy  2017   [place]: random biopsies normal. TI normal   ESOPHAGOGASTRODUODENOSCOPY  05/29/11   normal   ESOPHAGOGASTRODUODENOSCOPY N/A 01/21/2016   Dr. Michail Sermon: normal   EYE SURGERY Bilateral    cataracts removed   FEMUR IM NAIL Right 04/10/2018   Procedure: INTRAMEDULLARY (IM) NAIL FEMORAL;  Surgeon: Paralee Cancel, MD;  Location: [facility];  Service: Orthopedics;  Laterality: Right;   FRACTURE SURGERY     GIVENS CAPSULE STUDY  11/17/2020   HARDWARE REMOVAL Right 04/10/2018   Procedure: HARDWARE REMOVAL RIGHT DISTAL FEMUR;  Surgeon: Paralee Cancel, MD;  Location: [facility];  Service: Orthopedics;  Laterality: Right;   HERNIA REPAIR     IR FLUORO GUIDE CV LINE RIGHT   04/01/2017   IR US GUIDE VASC ACCESS RIGHT  04/01/2017   JOINT REPLACEMENT Right    hip   OPEN REDUCTION INTERNAL FIXATION (ORIF) DISTAL RADIAL FRACTURE Right 05/28/2019   Procedure: OPEN REDUCTION INTERNAL FIXATION (ORIF) DISTAL RADIAL FRACTURE;  Surgeon: Verner Mould, MD;  Location: [place];  Service: Orthopedics;  Laterality: Right;  with block 90 minutes   ORIF FEMUR FRACTURE Right 03/31/2017   Procedure: OPEN REDUCTION INTERNAL FIXATION (ORIF) DISTAL FEMUR FRACTURE;  Surgeon: Paralee Cancel, MD;  Location: [place];  Service: Orthopedics;  Laterality: Right;   removal of kidney stone     Right knee arthroscopic surgery     TONSILLECTOMY     TOTAL ABDOMINAL HYSTERECTOMY      Current Medications: Current Meds  Medication Sig   acetaminophen (TYLENOL) 325 MG tablet Take 2 tablets (650 mg total) by mouth every 4 (four) hours.   allopurinol (ZYLOPRIM) 100 MG tablet Take 100 mg by mouth daily.  Ascorbic Acid (VITAMIN C WITH ROSE HIPS) 500 MG tablet Take 500 mg by mouth every morning.   atorvastatin (LIPITOR) 80 MG tablet Take 1 tablet (80 mg total) by mouth daily. Restart on 06/29/22   busPIRone (BUSPAR) 10 MG tablet Take 10 mg by mouth 3 (three) times daily as needed for anxiety.   calcitRIOL (ROCALTROL) 0.25 MCG capsule Take 0.25 mcg by mouth 3 (three) times a week. Monday,Wed and Fri.   Cholecalciferol (VITAMIN D) 2000 units CAPS Take 2,000 Units by mouth every morning.    cyanocobalamin (,VITAMIN B-12,) 1000 MCG/ML injection Inject 1,000 mcg into the muscle every 30 (thirty) days.     dicyclomine (BENTYL) 20 MG tablet Take 1 tablet (20 mg total) by mouth in the morning and at bedtime.   diphenhydrAMINE (BENADRYL) 2 % cream Apply topically 3 (three) times daily as needed for itching.   esomeprazole (NEXIUM) 40 MG capsule Take 40 mg by mouth daily before breakfast.     furosemide (LASIX) 40 MG tablet Take 1 tablet (40 mg total) by mouth daily.   gabapentin (NEURONTIN) 300 MG capsule Take  1 capsule (300 mg total) by mouth at bedtime.   hydrALAZINE (APRESOLINE) 50 MG tablet Take 1 tablet (50 mg total) by mouth 2 (two) times daily.   insulin aspart (NOVOLOG) 100 UNIT/ML FlexPen Inject 5-11 Units into the skin 3 (three) times daily with meals.   insulin detemir (LEVEMIR FLEXPEN) 100 UNIT/ML FlexPen Inject 20 Units into the skin at bedtime.   levETIRAcetam (KEPPRA) 500 MG tablet Take 500 mg by mouth 2 (two) times daily.   metoprolol succinate (TOPROL XL) 100 MG 24 hr tablet Take 1 tablet (100 mg total) by mouth daily. Take with or immediately following a meal.   temazepam (RESTORIL) 15 MG capsule Take 15 mg by mouth at bedtime as needed.   tiZANidine (ZANAFLEX) 4 MG tablet Take 4 mg by mouth 2 (two) times daily.   valsartan (DIOVAN) 80 MG tablet Take 80 mg by mouth daily.   ziprasidone (GEODON) 20 MG capsule Take 20 mg by mouth 2 (two) times daily.     Allergies:   Cozaar [losartan], Quinolones, Cymbalta [duloxetine hcl], Sulfa antibiotics, Synalar [fluocinolone], Cipro [ciprofloxacin hcl], Diovan [valsartan], Glucophage [metformin], Ketalar [ketamine], Norco [hydrocodone-acetaminophen], Norvasc [amlodipine besylate], Nsaids, Tape, and Zestril [lisinopril]   Social History   Socioeconomic History   Marital status: Married    Spouse name: Not on file   Number of children: 2   Years of education: Not on file   Highest education level: Not on file  Occupational History   Occupation: Disabled  Tobacco Use   Smoking status: Never   Smokeless tobacco: Never  Vaping Use   Vaping Use: Never used  Substance and Sexual Activity   Alcohol use: No   Drug use: No   Sexual activity: Not on file    Comment: Hysterectomy  Other Topics Concern   Not on file  Social History Narrative   Patient is married 2 children she is disabled, 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    Social Determinants of Health   Financial Resource Strain: Not on file  Food Insecurity: No Food Insecurity (07/04/2022)   Hunger Vital Sign    Worried  About Running Out of Food in the Last Year: Never true    Ran Out of Food in the Last Year: Never true  Transportation Needs: No Transportation Needs (07/04/2022)   PRAPARE - Hydrologist (Medical): No    Lack of Transportation (Non-Medical): No  Physical Activity: Not on file  Stress: Not on file  Social Connections: Not on file     Family History: The patient's family history includes Colon cancer in her brother; Diabetes in her mother. There is no history of Esophageal cancer, Rectal cancer, or Stomach cancer.  ROS:   Review of Systems  Constitutional: Negative.   HENT: Negative.    Eyes: Negative.   Respiratory: Negative.    Cardiovascular: Negative.   Gastrointestinal: Negative.   Genitourinary: Negative.   Musculoskeletal:  Positive for falls and joint pain. Negative for back pain, myalgias and neck pain.       See HPI.  Skin: Negative.   Neurological: Negative.   Endo/Heme/Allergies: Negative.   Psychiatric/Behavioral: Negative.     Please see the history of present illness.    All other systems reviewed and are negative.  EKGs/Labs/Other Studies Reviewed:    The following studies were reviewed today:   EKG:  EKG is ordered today.  The ekg ordered today demonstrates NSR, 80 bpm, LVH, T wave abnormality, seen on prior EKG.  No acute ischemic changes.  Bilateral carotid duplex on August 28, 2021: Right Carotid: Not visualized due to central neck line. Left Carotid: Velocities in the left ICA are consistent with a 1-39%  stenosis. Vertebrals: Left vertebral artery demonstrates antegrade flow.   Vascular ultrasound lower extremity venous bilateral (DVT) on August 28, 2021: - No evidence of deep vein thrombosis seen in  the lower extremities,  bilaterally.  -No evidence of popliteal cyst, bilaterally.    2D echocardiogram on August 21, 2021:  1. Left ventricular ejection fraction, by estimation, is 60 to 65%. The  left ventricle has normal function. The left ventricle has no regional  wall motion abnormalities. Left ventricular diastolic parameters were  normal.   2. Right ventricular systolic function is normal. The right ventricular  size is normal. There is mildly elevated pulmonary artery systolic  pressure.   3. Left atrial size was mildly dilated.   4. The mitral valve is normal in structure. Mild mitral valve  regurgitation.   5. The aortic valve is tricuspid. Aortic valve regurgitation is not  visualized. Mild aortic valve sclerosis is present, with no evidence of  aortic valve stenosis.   6. The inferior vena cava is normal in size with greater than 50%  respiratory variability, suggesting right atrial pressure of 3 mmHg.   Comparison(s): The left ventricular function is unchanged.   Cardiac telemetry monitor on August 05, 2017: Telemetry tracings show NSR and ST.  No significant arrhythmias.  No symptoms reported.  Recent Labs: 08/19/2021: B Natriuretic Peptide 430.0 08/20/2021: TSH 0.734 07/05/2022: Magnesium 1.9 07/06/2022: ALT 15 07/31/2022: BUN 40; Creatinine, Ser 2.36; Hemoglobin 9.6; Platelets 212; Potassium 5.5; Sodium 136  Recent Lipid Panel    Component Value Date/Time   CHOL 115 06/21/2022 0607   CHOL 405 (H) 08/22/2020 1426   TRIG 116 06/21/2022 0607   HDL 44 06/21/2022 0607   HDL 30 (L) 08/22/2020 1426   CHOLHDL 2.6 06/21/2022 0607   VLDL 23 06/21/2022 0607   LDLCALC 48 06/21/2022 0607   LDLCALC Comment (A) 08/22/2020 1426   LDLDIRECT 117.9 (H) 05/04/2021 1150  Physical Exam:    VS:  BP 124/70   Pulse 80   Ht _0  (1.575 m)   Wt 121 lb (54.9 kg)   LMP  (LMP Unknown)   BMI 22.13 kg/m     Wt Readings from Last 3 Encounters:  07/31/22 121 lb (54.9 kg)   07/31/22 121 lb (54.9 kg)  07/12/22 127 lb 6.4 oz (57.8 kg)     GEN: Well nourished, well developed in no acute distress HEENT: Normal NECK: No JVD; No carotid bruits CARDIAC: S1/S2, RRR, no murmurs, rubs, gallops; 2+ peripheral pulses throughout, strong and equal bilaterally RESPIRATORY:  Clear and diminished to auscultation without rales, wheezing or rhonchi  ABDOMEN: Soft, non-tender, non-distended, bowel sounds x 4 MUSCULOSKELETAL:  No edema; No deformity  SKIN: Warm and dry NEUROLOGIC:  Alert and oriented x 3 PSYCHIATRIC:  Normal affect   ASSESSMENT:    1. Preoperative cardiovascular examination   2. Chronic diastolic CHF (congestive heart failure) (HCC)   3. Atherosclerosis of coronary artery of native heart without angina pectoris, unspecified vessel or lesion type   4. Aortic atherosclerosis (HCC)   5. Carotid artery disease, unspecified laterality, unspecified type ([place])   6. Hyperlipidemia, unspecified hyperlipidemia type   7. Hypertension, unspecified type   8. Pulmonary hypertension, unspecified ([place])   9. CKD (chronic kidney disease) stage 4, GFR 15-29 ml/min (HCC)   10. Hyperkalemia    PLAN:    In order of problems listed above:  Preoperative cardiovascular risk assessment  Ms. Mole's perioperative risk of a major cardiac event is 11% according to the Revised Cardiac Risk Index (RCRI).  Therefore, she is at high risk for perioperative complications.   Her functional capacity is good at 6.05 METs according to the Duke Activity Status Index (DASI). Recommendations: According to ACC/AHA guidelines, no further cardiovascular testing needed.  The patient may proceed to surgery at acceptable risk.   Antiplatelet and/or Anticoagulation Recommendations: She is not on any aspirin or blood thinner that needs to be held prior to procedure.  She does not require any antibiotic therapy prior to procedure.  I consulted patient's regular cardiologist, Dr. Carlyle Dolly,  and he stated patient can proceed to surgery and does not require cardiac stress testing.  I discussed 11% risk of major cardiac event occurring with patient, and she verbalized understanding and is agreeable to proceed. Will route this note to the requesting party.    2. Chronic diastolic CHF Euvolemic and well compensated on exam. Low sodium diet, fluid restriction <2L, and daily weights encouraged. Educated to contact our office for weight gain of 2 lbs overnight or 5 lbs in one week. Continue current medication regimen. Will obtain BMET today.   3. Coronary and aortic atherosclerosis Stable with no anginal symptoms. No indication for ischemic evaluation.  Continue current medication regimen.  Last LDL was 48 in August 2023.  No medication changes at this time. Heart healthy diet, low-salt diet encouraged.   4. Carotid artery disease Carotid duplex in October 2022 revealed 1 to 39% stenosis of left carotid, right carotid was not visualized due to central neck line.  No carotid bruit heard on exam.  We will consider repeating carotid Doppler at next follow-up visit or when clinically indicated.  5. Hyperlipidemia Tolerating medication well.  Last LDL 48 in August 2023.  Continue Lipitor 80 mg daily.  6. Hypertension BP labile at home.  BP stable today at 124/70.  Given BP log and Discussed to monitor BP at home  at least 2 hours after medications and sitting for 5-10 minutes.  Discussed with her to notify our office if SBP is consistently greater than 140. Continue Lasix, hydralazine, metoprolol, and valsartan.  7. Pulmonary HTN Echo in 2022 revealed mildly elevated PASP. RV systolic function normal. Denies any symptoms or worsening SHOB.  We will continue to monitor this for now.  Discussed she needs to notify our office if she has worsening SHOB or new symptoms, and we will consider updating 2D echocardiogram at that time or when clinically indicated.  8. CKD stage 4, anemia of chronic  disease Most recent serum creatinine 2.130, eGFR 22.  Last hemoglobin 7.5.  Denies any new or worsening symptoms.  Will obtain CBC and BMET today.  Continue to follow-up with nephrology.  9. Hyperkalemia Most recent potassium level on file was 5.3 earlier this month. Denies any symptoms. We will repeat BMET to evaluate potassium level.  Currently not on any potassium binders.  Continue to follow-up with nephrology.  Addendum 08/01/22: Repeat K+ level is 5.5. Will have patient continue 40 mg of Lasix daily and hold potassium supplement for the following week. Will repeat BMET next week. If repeat BMET shows elevated K+, will consult Clinical Pharm D regarding potassium binder.   10. Disposition: Follow-up with me or Dr. Carlyle Dolly in 4 months or sooner if anything changes.  Medication Adjustments/Labs and Tests Ordered: Current medicines are reviewed at length with the patient today.  Concerns regarding medicines are outlined above.  Orders Placed This Encounter  Procedures   Basic metabolic panel   CBC   EKG 12-Lead   No orders of the defined types were placed in this encounter.   Patient Instructions  Medication Instructions:  Your Physician recommend you continue on your current medication as directed.    *If you need a refill on your cardiac medications before your next appointment, please call your pharmacy*   Lab Work: Your physician recommends that you return for lab work today- BMP, CBC   If you have labs (blood work) drawn today and your tests are completely normal, you will receive your results only by: MyChart Message (if you have MyChart) OR A paper copy in the mail If you have any lab test that is abnormal or we need to change your treatment, we will call you to review the results.  Follow-Up: At [facility], you and your health needs are our priority.  As part of our continuing mission to provide you with exceptional heart care, we have created  designated Provider Care Teams.  These Care Teams include your primary Cardiologist (physician) and Advanced Practice Providers (APPs -  Physician Assistants and Nurse Practitioners) who all work together to provide you with the care you need, when you need it.  We recommend signing up for the patient portal called "MyChart".  Sign up information is provided on this After Visit Summary.  MyChart is used to connect with patients for Virtual Visits (Telemedicine).  Patients are able to view lab/test results, encounter notes, upcoming appointments, etc.  Non-urgent messages can be sent to your provider as well.   To learn more about what you can do with MyChart, go to [email or web address].    Your next appointment:   February 2024 with Finis Bud in [facility]    Other Instructions Tips to Measure your Blood Pressure Correctly  To determine whether you have hypertension, a medical professional will take a blood pressure reading. How you prepare for the test,  the position of your arm, and other factors can change a blood pressure reading by 10% or more. That could be enough to hide high blood pressure, start you on a drug you don't really need, or lead your doctor to incorrectly adjust your medications.  National and international guidelines offer specific instructions for measuring blood pressure. If a doctor, nurse, or medical assistant isn't doing it right, don't hesitate to ask him or her to get with the guidelines.  Here's what you can do to ensure a correct reading:  Don't drink a caffeinated beverage or smoke during the 30 minutes before the test.  Sit quietly for five minutes before the test begins.  During the measurement, sit in a chair with your feet on the floor and your arm supported so your elbow is at about heart level.  The inflatable part of the cuff should completely cover at least 80% of your upper arm, and the cuff should be placed on bare skin, not over a shirt.  Don't  talk during the measurement.  Have your blood pressure measured twice, with a brief break in between. If the readings are different by 5 points or more, have it done a third time.  In 2017, new guidelines from the [place], the [employer] of Cardiology, and nine other health organizations lowered the diagnosis of high blood pressure to 130/80 mm Hg or higher for all adults. The guidelines also redefined the various blood pressure categories to now include normal, elevated, Stage 1 hypertension, Stage 2 hypertension, and hypertensive crisis (see "Blood pressure categories").  Blood pressure categories  Blood pressure category SYSTOLIC (upper number)  DIASTOLIC (lower number)  Normal Less than 120 mm Hg and Less than 80 mm Hg  Elevated 120-129 mm Hg and Less than 80 mm Hg  High blood pressure: Stage 1 hypertension 130-139 mm Hg or 80-89 mm Hg  High blood pressure: Stage 2 hypertension 140 mm Hg or higher or 90 mm Hg or higher  Hypertensive crisis (consult your doctor immediately) Higher than 180 mm Hg and/or Higher than 120 mm Hg  Source: American Heart Association and American Stroke Association. For more on getting your blood pressure under control, buy Controlling Your Blood Pressure, a Special Health Report from [facility].   Blood Pressure Log   Date   Time  Blood Pressure  Position  Example: Nov 1 9 AM 124/78 sitting                                                       Important Information About Sugar      '   Signed, Finis Bud, NP  08/01/2022 11:48 AM    [place]

## 2022-07-31 ENCOUNTER — Other Ambulatory Visit: Payer: Self-pay

## 2022-07-31 ENCOUNTER — Encounter (HOSPITAL_BASED_OUTPATIENT_CLINIC_OR_DEPARTMENT_OTHER): Payer: Self-pay | Admitting: Nurse Practitioner

## 2022-07-31 ENCOUNTER — Ambulatory Visit (INDEPENDENT_AMBULATORY_CARE_PROVIDER_SITE_OTHER): Payer: Medicare HMO | Admitting: Nurse Practitioner

## 2022-07-31 ENCOUNTER — Encounter (HOSPITAL_BASED_OUTPATIENT_CLINIC_OR_DEPARTMENT_OTHER): Payer: Self-pay

## 2022-07-31 ENCOUNTER — Emergency Department (HOSPITAL_BASED_OUTPATIENT_CLINIC_OR_DEPARTMENT_OTHER)
Admission: EM | Admit: 2022-07-31 | Discharge: 2022-07-31 | Discharge status: Against Medical Advice | Payer: Medicare HMO | Attending: Emergency Medicine | Admitting: Emergency Medicine

## 2022-07-31 VITALS — BP 124/70 | HR 80 | Ht 62.0 in | Wt 121.0 lb

## 2022-07-31 DIAGNOSIS — I272 Pulmonary hypertension, unspecified: Secondary | ICD-10-CM

## 2022-07-31 DIAGNOSIS — I779 Disorder of arteries and arterioles, unspecified: Secondary | ICD-10-CM

## 2022-07-31 DIAGNOSIS — Z01810 Encounter for preprocedural cardiovascular examination: Secondary | ICD-10-CM | POA: Diagnosis not present

## 2022-07-31 DIAGNOSIS — E875 Hyperkalemia: Secondary | ICD-10-CM | POA: Diagnosis not present

## 2022-07-31 DIAGNOSIS — N184 Chronic kidney disease, stage 4 (severe): Secondary | ICD-10-CM | POA: Diagnosis not present

## 2022-07-31 DIAGNOSIS — Z4689 Encounter for fitting and adjustment of other specified devices: Secondary | ICD-10-CM | POA: Insufficient documentation

## 2022-07-31 DIAGNOSIS — I700 Atherosclerosis of aorta: Secondary | ICD-10-CM

## 2022-07-31 DIAGNOSIS — I5032 Chronic diastolic (congestive) heart failure: Secondary | ICD-10-CM

## 2022-07-31 DIAGNOSIS — I10 Essential (primary) hypertension: Secondary | ICD-10-CM

## 2022-07-31 DIAGNOSIS — E785 Hyperlipidemia, unspecified: Secondary | ICD-10-CM | POA: Diagnosis not present

## 2022-07-31 DIAGNOSIS — Z5321 Procedure and treatment not carried out due to patient leaving prior to being seen by health care provider: Secondary | ICD-10-CM | POA: Insufficient documentation

## 2022-07-31 DIAGNOSIS — I2510 Atherosclerotic heart disease of native coronary artery without angina pectoris: Secondary | ICD-10-CM | POA: Diagnosis not present

## 2022-07-31 NOTE — Patient Instructions (Signed)
Medication Instructions:  Your Physician recommend you continue on your current medication as directed.    *If you need a refill on your cardiac medications before your next appointment, please call your pharmacy*   Lab Work: Your physician recommends that you return for lab work today- BMP, CBC   If you have labs (blood work) drawn today and your tests are completely normal, you will receive your results only by: MyChart Message (if you have MyChart) OR A paper copy in the mail If you have any lab test that is abnormal or we need to change your treatment, we will call you to review the results.  Follow-Up: At [facility], you and your health needs are our priority.  As part of our continuing mission to provide you with exceptional heart care, we have created designated Provider Care Teams.  These Care Teams include your primary Cardiologist (physician) and Advanced Practice Providers (APPs -  Physician Assistants and Nurse Practitioners) who all work together to provide you with the care you need, when you need it.  We recommend signing up for the patient portal called "MyChart".  Sign up information is provided on this After Visit Summary.  MyChart is used to connect with patients for Virtual Visits (Telemedicine).  Patients are able to view lab/test results, encounter notes, upcoming appointments, etc.  Non-urgent messages can be sent to your provider as well.   To learn more about what you can do with MyChart, go to [email or web address].    Your next appointment:   February 2024 with Finis Bud in [facility]    Other Instructions Tips to Measure your Blood Pressure Correctly  To determine whether you have hypertension, a medical professional will take a blood pressure reading. How you prepare for the test, the position of your arm, and other factors can change a blood pressure reading by 10% or more. That could be enough to hide high blood pressure, start you on a drug  you don't really need, or lead your doctor to incorrectly adjust your medications.  National and international guidelines offer specific instructions for measuring blood pressure. If a doctor, nurse, or medical assistant isn't doing it right, don't hesitate to ask him or her to get with the guidelines.  Here's what you can do to ensure a correct reading:  Don't drink a caffeinated beverage or smoke during the 30 minutes before the test.  Sit quietly for five minutes before the test begins.  During the measurement, sit in a chair with your feet on the floor and your arm supported so your elbow is at about heart level.  The inflatable part of the cuff should completely cover at least 80% of your upper arm, and the cuff should be placed on bare skin, not over a shirt.  Don't talk during the measurement.  Have your blood pressure measured twice, with a brief break in between. If the readings are different by 5 points or more, have it done a third time.  In 2017, new guidelines from the [employer], the [employer] of Cardiology, and nine other health organizations lowered the diagnosis of high blood pressure to 130/80 mm Hg or higher for all adults. The guidelines also redefined the various blood pressure categories to now include normal, elevated, Stage 1 hypertension, Stage 2 hypertension, and hypertensive crisis (see "Blood pressure categories").  Blood pressure categories  Blood pressure category SYSTOLIC (upper number)  DIASTOLIC (lower number)  Normal Less than 120 mm Hg and Less  than 80 mm Hg  Elevated 120-129 mm Hg and Less than 80 mm Hg  High blood pressure: Stage 1 hypertension 130-139 mm Hg or 80-89 mm Hg  High blood pressure: Stage 2 hypertension 140 mm Hg or higher or 90 mm Hg or higher  Hypertensive crisis (consult your doctor immediately) Higher than 180 mm Hg and/or Higher than 120 mm Hg  Source: American Heart Association and American Stroke Association. For  more on getting your blood pressure under control, buy Controlling Your Blood Pressure, a Special Health Report from [facility].   Blood Pressure Log   Date   Time  Blood Pressure  Position  Example: Nov 1 9 AM 124/78 sitting                                                       Important Information About Sugar      '

## 2022-07-31 NOTE — ED Notes (Addendum)
Pt called x2 for room assignment. Pt contacted vis telephone by this RN to see if she was outside. Pt states she left and went home. She was advised by someone in cardiology office to come to the ED to get a sling or arm pain. Pt is scheduled to have surgery for same arm. Pt states she left because she has a sling at home.

## 2022-07-31 NOTE — ED Notes (Signed)
Pt called x2 for room assignment

## 2022-07-31 NOTE — ED Triage Notes (Signed)
Pt states she had a fall several months ago. Pt states she broke her right humerus. Pt was sent downstairs from primary care office for a sling.

## 2022-08-01 ENCOUNTER — Other Ambulatory Visit (HOSPITAL_BASED_OUTPATIENT_CLINIC_OR_DEPARTMENT_OTHER): Payer: Self-pay

## 2022-08-01 ENCOUNTER — Encounter (HOSPITAL_BASED_OUTPATIENT_CLINIC_OR_DEPARTMENT_OTHER): Payer: Self-pay

## 2022-08-01 DIAGNOSIS — I10 Essential (primary) hypertension: Secondary | ICD-10-CM

## 2022-08-01 DIAGNOSIS — I700 Atherosclerosis of aorta: Secondary | ICD-10-CM | POA: Insufficient documentation

## 2022-08-01 LAB — CBC
Hematocrit: 29 % — ABNORMAL LOW (ref 34.0–46.6)
Hemoglobin: 9.6 g/dL — ABNORMAL LOW (ref 11.1–15.9)
MCH: 31.8 pg (ref 26.6–33.0)
MCHC: 33.1 g/dL (ref 31.5–35.7)
MCV: 96 fL (ref 79–97)
Platelets: 212 10*3/uL (ref 150–450)
RBC: 3.02 x10E6/uL — ABNORMAL LOW (ref 3.77–5.28)
RDW: 13.7 % (ref 11.7–15.4)
WBC: 10.4 10*3/uL (ref 3.4–10.8)

## 2022-08-01 LAB — BASIC METABOLIC PANEL
BUN/Creatinine Ratio: 17 (ref 12–28)
BUN: 40 mg/dL — ABNORMAL HIGH (ref 8–27)
CO2: 18 mmol/L — ABNORMAL LOW (ref 20–29)
Calcium: 9.6 mg/dL (ref 8.7–10.3)
Chloride: 97 mmol/L (ref 96–106)
Creatinine, Ser: 2.36 mg/dL — ABNORMAL HIGH (ref 0.57–1.00)
Glucose: 146 mg/dL — ABNORMAL HIGH (ref 70–99)
Potassium: 5.5 mmol/L — ABNORMAL HIGH (ref 3.5–5.2)
Sodium: 136 mmol/L (ref 134–144)
eGFR: 22 mL/min/{1.73_m2} — ABNORMAL LOW (ref >59)

## 2022-08-01 NOTE — Progress Notes (Signed)
My Chart message sent to patient regarding orders for BMET next week.  Lab requisition mailed to patient. TDS

## 2022-08-01 NOTE — Progress Notes (Deleted)
08/01/2022 Heather [name] [id number] [birth year]  Referring provider: [name], MD Primary GI doctor: Dr. Carlean Purl  ASSESSMENT AND PLAN:   Assessment: 71 y.o. female here for assessment of the following: No diagnosis found.  Plan:  No orders of the defined types were placed in this encounter.   No orders of the defined types were placed in this encounter.   History of Present Illness:  71 y.o. female  with a past medical history of small bowel Crohn's disease diagnosed 10/2020, chronic diarrhea, B12 deficiency, type 2 diabetes with CKD, history of pancreatitis related to Victoza/hypertriglyceridemia, status post cholecystectomy and others listed below, returns to clinic today for evaluation of acute pancreatitis.  Had several admissions this past year February 2023 sepsis related to cellulitis, but 3 recent admissions for acute pancreatitis, in July, Aug 22-25, and 09/07 at [facility]. 07/05/2022 MRCP showed acute pancreatitis with 2 cm fluid collection adjacent to pancreatic tail She had normal triglyceride 06/21/2022 of 117, She is status post cholecystectomy. Patient was on Ozempic, discussed to avoid all GLP-1's in the future as she also has history of pancreatits due to [place].. Patient denies any alcohol use.  14/09/2020 EGD unremarkable, biopsies negative 03/18/2020 colonoscopy unremarkable, biopsies negative 11/17/2020 capsule endoscopy ulcers scattered throughout small intestines  She has history of chronic diarrhea. Patient has not had pancreatic elastase done, but she did get samples of Creon/Zenpep in 2019, unknown how she did with this. Small bowel Crohn's disease diagnosed January 2022 with small bowel capsule study. February 2022 started on Humira SQ stopped 12/01/21. Had trial of Cholestyramine Light 2019 but stopped due to side effects.  Current History The patient has {0-10:33138} bowel movements per day which are {diarrhea characteristics:14012}.    {abd pain:17887}.   The patient {has/not:15037} fever.   The patient {has/not:15037} weight loss.    Recent labs: 03/16/2022 CRP 3.6 03/16/2022 SED RATE 28 Fecal cal *** 07/31/2022 WBC 10.4 HGB 9.6 MCV 96 Platelets 212 05/17/2022 Iron 161 Ferritin 1,852  B12 952 FOLATE 14.4 07/06/2022 AST 15 ALT 15 Alkphos 102 TBili 0.7  TB GOLD 11/28/2020 NEGATIVE or TB skin if indeterminate. NEED REPEAT HepBsAG 06/22/2022 NON REACTIVE  TPMT Activity: 22   She  reports that she has never smoked. She has never used smokeless tobacco. She reports that she does not drink alcohol and does not use drugs. Her family history includes Colon cancer in her brother; Diabetes in her mother.   Current Medications:   Current Outpatient Medications (Endocrine & Metabolic):    calcitRIOL (ROCALTROL) 0.25 MCG capsule, Take 0.25 mcg by mouth 3 (three) times a week. Monday,Wed and Fri.   FARXIGA 10 MG TABS tablet, Take 10 mg by mouth daily.   glipiZIDE (GLUCOTROL) 5 MG tablet, Take 5 mg by mouth daily.   insulin aspart (NOVOLOG) 100 UNIT/ML FlexPen, Inject 5-11 Units into the skin 3 (three) times daily with meals.   insulin detemir (LEVEMIR FLEXPEN) 100 UNIT/ML FlexPen, Inject 20 Units into the skin at bedtime.  Current Outpatient Medications (Cardiovascular):    atorvastatin (LIPITOR) 80 MG tablet, Take 1 tablet (80 mg total) by mouth daily. Restart on 06/29/22   furosemide (LASIX) 40 MG tablet, Take 1 tablet (40 mg total) by mouth daily.   hydrALAZINE (APRESOLINE) 50 MG tablet, Take 1 tablet (50 mg total) by mouth 2 (two) times daily.   metoprolol succinate (TOPROL XL) 100 MG 24 hr tablet, Take 1 tablet (100 mg total) by mouth daily. Take with or immediately following  a meal.   spironolactone (ALDACTONE) 25 MG tablet, Take 25 mg by mouth daily.   valsartan (DIOVAN) 80 MG tablet, Take 80 mg by mouth daily.   Current Outpatient Medications (Analgesics):    acetaminophen (TYLENOL) 325 MG tablet, Take 2 tablets (650  mg total) by mouth every 4 (four) hours.   allopurinol (ZYLOPRIM) 100 MG tablet, Take 100 mg by mouth daily.  Current Outpatient Medications (Hematological):    cyanocobalamin (,VITAMIN B-12,) 1000 MCG/ML injection, Inject 1,000 mcg into the muscle every 30 (thirty) days.    Current Outpatient Medications (Other):    Ascorbic Acid (VITAMIN C WITH ROSE HIPS) 500 MG tablet, Take 500 mg by mouth every morning.   busPIRone (BUSPAR) 10 MG tablet, Take 10 mg by mouth 3 (three) times daily as needed for anxiety.   Cholecalciferol (VITAMIN D) 2000 units CAPS, Take 2,000 Units by mouth every morning.    dicyclomine (BENTYL) 20 MG tablet, Take 1 tablet (20 mg total) by mouth in the morning and at bedtime.   diphenhydrAMINE (BENADRYL) 2 % cream, Apply topically 3 (three) times daily as needed for itching.   esomeprazole (NEXIUM) 40 MG capsule, Take 40 mg by mouth daily before breakfast.     gabapentin (NEURONTIN) 300 MG capsule, Take 1 capsule (300 mg total) by mouth at bedtime.   levETIRAcetam (KEPPRA) 500 MG tablet, Take 500 mg by mouth 2 (two) times daily.   potassium chloride SA (KLOR-CON M) 20 MEQ tablet, Take 20 mEq by mouth 3 (three) times a week.   temazepam (RESTORIL) 15 MG capsule, Take 15 mg by mouth at bedtime as needed.   tiZANidine (ZANAFLEX) 4 MG tablet, Take 4 mg by mouth 2 (two) times daily.   venlafaxine XR (EFFEXOR-XR) 150 MG 24 hr capsule, Take 150 mg by mouth daily.   Vitamin D, Ergocalciferol, (DRISDOL) 1.25 MG (50000 UNIT) CAPS capsule, Take 50,000 Units by mouth once a week.   ziprasidone (GEODON) 20 MG capsule, Take 20 mg by mouth 2 (two) times daily.  Surgical History:  She  has a past surgical history that includes c-spine surgery; Cardiac catheterization; Total abdominal hysterectomy; Cholecystectomy; Right knee arthroscopic surgery; Colonoscopy (04/21/2011; 05/29/11); Esophagogastroduodenoscopy (05/29/11); Esophagogastroduodenoscopy (N/A, 01/21/2016); IR Fluoro Guide CV Line  Right (04/01/2017); IR US Guide Vasc Access Right (04/01/2017); ORIF femur fracture (Right, 03/31/2017); colonoscopy (2017); Hardware Removal (Right, 04/10/2018); Femur IM nail (Right, 04/10/2018); Joint replacement (Right); Tonsillectomy; Eye surgery (Bilateral); removal of kidney stone; Open reduction internal fixation (orif) distal radial fracture (Right, 05/28/2019); Back surgery; Givens capsule study (11/17/2020); Fracture surgery; and Hernia repair.  Current Medications, Allergies, Past Medical History, Past Surgical History, Family History and Social History were reviewed in [employer] record.  Physical Exam: LMP  (LMP Unknown)  General:   Pleasant, well developed female in no acute distress Heart : Regular rate and rhythm; no murmurs Pulm: Clear anteriorly; no wheezing Abdomen:  {BlankSingle:19197::"Distended","Ridged","Soft"}, {BlankSingle:19197::"Flat","Obese","Non-distended"} AB, {BlankSingle:19197::"Absent","Hyperactive, tinkling","Hypoactive","Sluggish","Active"} bowel sounds. {actendernessAB:27319} tenderness {anatomy; site abdomen:5010}. {BlankMultiple:19196::"Without guarding","With guarding","Without rebound","With rebound"}, No organomegaly appreciated. Rectal: {acrectalexam:27461} Extremities:  {With/without:5700}  edema. Neurologic:  Alert and  oriented x4;  No focal deficits.  Psych:  Cooperative. Normal mood and affect.   Vladimir Crofts, PA-C 08/01/22

## 2022-08-02 DIAGNOSIS — Z1231 Encounter for screening mammogram for malignant neoplasm of breast: Secondary | ICD-10-CM | POA: Diagnosis not present

## 2022-08-03 DIAGNOSIS — R55 Syncope and collapse: Secondary | ICD-10-CM | POA: Diagnosis not present

## 2022-08-03 DIAGNOSIS — E1122 Type 2 diabetes mellitus with diabetic chronic kidney disease: Secondary | ICD-10-CM | POA: Diagnosis not present

## 2022-08-03 DIAGNOSIS — Z789 Other specified health status: Secondary | ICD-10-CM | POA: Diagnosis not present

## 2022-08-03 DIAGNOSIS — E871 Hypo-osmolality and hyponatremia: Secondary | ICD-10-CM | POA: Diagnosis not present

## 2022-08-03 DIAGNOSIS — I4891 Unspecified atrial fibrillation: Secondary | ICD-10-CM | POA: Diagnosis not present

## 2022-08-03 DIAGNOSIS — E785 Hyperlipidemia, unspecified: Secondary | ICD-10-CM | POA: Diagnosis not present

## 2022-08-03 DIAGNOSIS — N183 Chronic kidney disease, stage 3 unspecified: Secondary | ICD-10-CM | POA: Diagnosis not present

## 2022-08-03 DIAGNOSIS — E11649 Type 2 diabetes mellitus with hypoglycemia without coma: Secondary | ICD-10-CM | POA: Diagnosis not present

## 2022-08-03 DIAGNOSIS — D631 Anemia in chronic kidney disease: Secondary | ICD-10-CM | POA: Diagnosis not present

## 2022-08-03 DIAGNOSIS — N200 Calculus of kidney: Secondary | ICD-10-CM | POA: Diagnosis not present

## 2022-08-03 DIAGNOSIS — I2781 Cor pulmonale (chronic): Secondary | ICD-10-CM | POA: Diagnosis not present

## 2022-08-03 DIAGNOSIS — N189 Chronic kidney disease, unspecified: Secondary | ICD-10-CM | POA: Diagnosis not present

## 2022-08-03 DIAGNOSIS — R262 Difficulty in walking, not elsewhere classified: Secondary | ICD-10-CM | POA: Diagnosis not present

## 2022-08-03 DIAGNOSIS — N3289 Other specified disorders of bladder: Secondary | ICD-10-CM | POA: Diagnosis not present

## 2022-08-03 DIAGNOSIS — Z299 Encounter for prophylactic measures, unspecified: Secondary | ICD-10-CM | POA: Diagnosis not present

## 2022-08-03 DIAGNOSIS — R2689 Other abnormalities of gait and mobility: Secondary | ICD-10-CM | POA: Diagnosis not present

## 2022-08-03 DIAGNOSIS — E875 Hyperkalemia: Secondary | ICD-10-CM | POA: Diagnosis not present

## 2022-08-03 DIAGNOSIS — I5032 Chronic diastolic (congestive) heart failure: Secondary | ICD-10-CM | POA: Diagnosis not present

## 2022-08-03 DIAGNOSIS — I129 Hypertensive chronic kidney disease with stage 1 through stage 4 chronic kidney disease, or unspecified chronic kidney disease: Secondary | ICD-10-CM | POA: Diagnosis not present

## 2022-08-03 DIAGNOSIS — I10 Essential (primary) hypertension: Secondary | ICD-10-CM | POA: Diagnosis not present

## 2022-08-03 DIAGNOSIS — R54 Age-related physical debility: Secondary | ICD-10-CM | POA: Diagnosis not present

## 2022-08-03 DIAGNOSIS — G40909 Epilepsy, unspecified, not intractable, without status epilepticus: Secondary | ICD-10-CM | POA: Diagnosis not present

## 2022-08-03 DIAGNOSIS — M549 Dorsalgia, unspecified: Secondary | ICD-10-CM | POA: Diagnosis not present

## 2022-08-03 DIAGNOSIS — R443 Hallucinations, unspecified: Secondary | ICD-10-CM | POA: Diagnosis not present

## 2022-08-03 DIAGNOSIS — M6281 Muscle weakness (generalized): Secondary | ICD-10-CM | POA: Diagnosis not present

## 2022-08-03 DIAGNOSIS — I130 Hypertensive heart and chronic kidney disease with heart failure and stage 1 through stage 4 chronic kidney disease, or unspecified chronic kidney disease: Secondary | ICD-10-CM | POA: Diagnosis not present

## 2022-08-03 DIAGNOSIS — K859 Acute pancreatitis without necrosis or infection, unspecified: Secondary | ICD-10-CM | POA: Diagnosis not present

## 2022-08-03 DIAGNOSIS — I951 Orthostatic hypotension: Secondary | ICD-10-CM | POA: Diagnosis not present

## 2022-08-03 DIAGNOSIS — K5730 Diverticulosis of large intestine without perforation or abscess without bleeding: Secondary | ICD-10-CM | POA: Diagnosis not present

## 2022-08-03 DIAGNOSIS — Z794 Long term (current) use of insulin: Secondary | ICD-10-CM | POA: Diagnosis not present

## 2022-08-03 DIAGNOSIS — D638 Anemia in other chronic diseases classified elsewhere: Secondary | ICD-10-CM | POA: Diagnosis not present

## 2022-08-03 DIAGNOSIS — Z79899 Other long term (current) drug therapy: Secondary | ICD-10-CM | POA: Diagnosis not present

## 2022-08-04 DIAGNOSIS — D631 Anemia in chronic kidney disease: Secondary | ICD-10-CM | POA: Diagnosis not present

## 2022-08-04 DIAGNOSIS — Z794 Long term (current) use of insulin: Secondary | ICD-10-CM | POA: Diagnosis not present

## 2022-08-04 DIAGNOSIS — Z79899 Other long term (current) drug therapy: Secondary | ICD-10-CM | POA: Diagnosis not present

## 2022-08-04 DIAGNOSIS — I129 Hypertensive chronic kidney disease with stage 1 through stage 4 chronic kidney disease, or unspecified chronic kidney disease: Secondary | ICD-10-CM | POA: Diagnosis not present

## 2022-08-04 DIAGNOSIS — K859 Acute pancreatitis without necrosis or infection, unspecified: Secondary | ICD-10-CM | POA: Diagnosis not present

## 2022-08-04 DIAGNOSIS — N200 Calculus of kidney: Secondary | ICD-10-CM | POA: Diagnosis not present

## 2022-08-04 DIAGNOSIS — N183 Chronic kidney disease, stage 3 unspecified: Secondary | ICD-10-CM | POA: Diagnosis not present

## 2022-08-04 DIAGNOSIS — E1122 Type 2 diabetes mellitus with diabetic chronic kidney disease: Secondary | ICD-10-CM | POA: Diagnosis not present

## 2022-08-05 DIAGNOSIS — N183 Chronic kidney disease, stage 3 unspecified: Secondary | ICD-10-CM | POA: Diagnosis not present

## 2022-08-05 DIAGNOSIS — Z79899 Other long term (current) drug therapy: Secondary | ICD-10-CM | POA: Diagnosis not present

## 2022-08-05 DIAGNOSIS — I129 Hypertensive chronic kidney disease with stage 1 through stage 4 chronic kidney disease, or unspecified chronic kidney disease: Secondary | ICD-10-CM | POA: Diagnosis not present

## 2022-08-05 DIAGNOSIS — N200 Calculus of kidney: Secondary | ICD-10-CM | POA: Diagnosis not present

## 2022-08-05 DIAGNOSIS — E875 Hyperkalemia: Secondary | ICD-10-CM | POA: Diagnosis not present

## 2022-08-05 DIAGNOSIS — K859 Acute pancreatitis without necrosis or infection, unspecified: Secondary | ICD-10-CM | POA: Diagnosis not present

## 2022-08-05 DIAGNOSIS — E871 Hypo-osmolality and hyponatremia: Secondary | ICD-10-CM | POA: Diagnosis not present

## 2022-08-05 DIAGNOSIS — D631 Anemia in chronic kidney disease: Secondary | ICD-10-CM | POA: Diagnosis not present

## 2022-08-05 DIAGNOSIS — E1122 Type 2 diabetes mellitus with diabetic chronic kidney disease: Secondary | ICD-10-CM | POA: Diagnosis not present

## 2022-08-06 ENCOUNTER — Ambulatory Visit: Payer: 59 | Admitting: Physician Assistant

## 2022-08-06 DIAGNOSIS — N183 Chronic kidney disease, stage 3 unspecified: Secondary | ICD-10-CM | POA: Diagnosis not present

## 2022-08-06 DIAGNOSIS — K859 Acute pancreatitis without necrosis or infection, unspecified: Secondary | ICD-10-CM | POA: Diagnosis not present

## 2022-08-06 DIAGNOSIS — D631 Anemia in chronic kidney disease: Secondary | ICD-10-CM | POA: Diagnosis not present

## 2022-08-06 DIAGNOSIS — I129 Hypertensive chronic kidney disease with stage 1 through stage 4 chronic kidney disease, or unspecified chronic kidney disease: Secondary | ICD-10-CM | POA: Diagnosis not present

## 2022-08-06 DIAGNOSIS — R54 Age-related physical debility: Secondary | ICD-10-CM | POA: Diagnosis not present

## 2022-08-06 DIAGNOSIS — Z79899 Other long term (current) drug therapy: Secondary | ICD-10-CM | POA: Diagnosis not present

## 2022-08-06 DIAGNOSIS — E875 Hyperkalemia: Secondary | ICD-10-CM | POA: Diagnosis not present

## 2022-08-06 DIAGNOSIS — E871 Hypo-osmolality and hyponatremia: Secondary | ICD-10-CM | POA: Diagnosis not present

## 2022-08-06 DIAGNOSIS — E1122 Type 2 diabetes mellitus with diabetic chronic kidney disease: Secondary | ICD-10-CM | POA: Diagnosis not present

## 2022-08-06 DIAGNOSIS — N200 Calculus of kidney: Secondary | ICD-10-CM | POA: Diagnosis not present

## 2022-08-07 DIAGNOSIS — E1122 Type 2 diabetes mellitus with diabetic chronic kidney disease: Secondary | ICD-10-CM | POA: Diagnosis not present

## 2022-08-07 DIAGNOSIS — R443 Hallucinations, unspecified: Secondary | ICD-10-CM | POA: Diagnosis not present

## 2022-08-07 DIAGNOSIS — N200 Calculus of kidney: Secondary | ICD-10-CM | POA: Diagnosis not present

## 2022-08-07 DIAGNOSIS — E875 Hyperkalemia: Secondary | ICD-10-CM | POA: Diagnosis not present

## 2022-08-07 DIAGNOSIS — D631 Anemia in chronic kidney disease: Secondary | ICD-10-CM | POA: Diagnosis not present

## 2022-08-07 DIAGNOSIS — N183 Chronic kidney disease, stage 3 unspecified: Secondary | ICD-10-CM | POA: Diagnosis not present

## 2022-08-07 DIAGNOSIS — I129 Hypertensive chronic kidney disease with stage 1 through stage 4 chronic kidney disease, or unspecified chronic kidney disease: Secondary | ICD-10-CM | POA: Diagnosis not present

## 2022-08-07 DIAGNOSIS — R54 Age-related physical debility: Secondary | ICD-10-CM | POA: Diagnosis not present

## 2022-08-07 DIAGNOSIS — G40909 Epilepsy, unspecified, not intractable, without status epilepticus: Secondary | ICD-10-CM | POA: Diagnosis not present

## 2022-08-08 ENCOUNTER — Ambulatory Visit: Payer: 59 | Admitting: Nurse Practitioner

## 2022-08-08 DIAGNOSIS — E871 Hypo-osmolality and hyponatremia: Secondary | ICD-10-CM | POA: Diagnosis not present

## 2022-08-08 DIAGNOSIS — E875 Hyperkalemia: Secondary | ICD-10-CM | POA: Diagnosis not present

## 2022-08-08 DIAGNOSIS — E1122 Type 2 diabetes mellitus with diabetic chronic kidney disease: Secondary | ICD-10-CM | POA: Diagnosis not present

## 2022-08-08 DIAGNOSIS — I129 Hypertensive chronic kidney disease with stage 1 through stage 4 chronic kidney disease, or unspecified chronic kidney disease: Secondary | ICD-10-CM | POA: Diagnosis not present

## 2022-08-08 DIAGNOSIS — D631 Anemia in chronic kidney disease: Secondary | ICD-10-CM | POA: Diagnosis not present

## 2022-08-08 DIAGNOSIS — N183 Chronic kidney disease, stage 3 unspecified: Secondary | ICD-10-CM | POA: Diagnosis not present

## 2022-08-08 DIAGNOSIS — N200 Calculus of kidney: Secondary | ICD-10-CM | POA: Diagnosis not present

## 2022-08-08 DIAGNOSIS — E1121 Type 2 diabetes mellitus with diabetic nephropathy: Secondary | ICD-10-CM

## 2022-08-08 DIAGNOSIS — I10 Essential (primary) hypertension: Secondary | ICD-10-CM

## 2022-08-08 DIAGNOSIS — G40909 Epilepsy, unspecified, not intractable, without status epilepticus: Secondary | ICD-10-CM | POA: Diagnosis not present

## 2022-08-08 DIAGNOSIS — R443 Hallucinations, unspecified: Secondary | ICD-10-CM | POA: Diagnosis not present

## 2022-08-08 DIAGNOSIS — E782 Mixed hyperlipidemia: Secondary | ICD-10-CM

## 2022-08-09 ENCOUNTER — Ambulatory Visit: Payer: Medicare HMO | Admitting: "Endocrinology

## 2022-08-09 DIAGNOSIS — D631 Anemia in chronic kidney disease: Secondary | ICD-10-CM | POA: Diagnosis not present

## 2022-08-09 DIAGNOSIS — N200 Calculus of kidney: Secondary | ICD-10-CM | POA: Diagnosis not present

## 2022-08-09 DIAGNOSIS — I129 Hypertensive chronic kidney disease with stage 1 through stage 4 chronic kidney disease, or unspecified chronic kidney disease: Secondary | ICD-10-CM | POA: Diagnosis not present

## 2022-08-09 DIAGNOSIS — N183 Chronic kidney disease, stage 3 unspecified: Secondary | ICD-10-CM | POA: Diagnosis not present

## 2022-08-09 DIAGNOSIS — E875 Hyperkalemia: Secondary | ICD-10-CM | POA: Diagnosis not present

## 2022-08-09 DIAGNOSIS — E1122 Type 2 diabetes mellitus with diabetic chronic kidney disease: Secondary | ICD-10-CM | POA: Diagnosis not present

## 2022-08-09 DIAGNOSIS — E871 Hypo-osmolality and hyponatremia: Secondary | ICD-10-CM | POA: Diagnosis not present

## 2022-08-09 DIAGNOSIS — R443 Hallucinations, unspecified: Secondary | ICD-10-CM | POA: Diagnosis not present

## 2022-08-09 DIAGNOSIS — G40909 Epilepsy, unspecified, not intractable, without status epilepticus: Secondary | ICD-10-CM | POA: Diagnosis not present

## 2022-08-10 DIAGNOSIS — K858 Other acute pancreatitis without necrosis or infection: Secondary | ICD-10-CM | POA: Diagnosis not present

## 2022-08-10 DIAGNOSIS — R5381 Other malaise: Secondary | ICD-10-CM | POA: Diagnosis not present

## 2022-08-10 DIAGNOSIS — R54 Age-related physical debility: Secondary | ICD-10-CM | POA: Diagnosis not present

## 2022-08-10 DIAGNOSIS — I5032 Chronic diastolic (congestive) heart failure: Secondary | ICD-10-CM | POA: Diagnosis not present

## 2022-08-10 DIAGNOSIS — E1122 Type 2 diabetes mellitus with diabetic chronic kidney disease: Secondary | ICD-10-CM | POA: Diagnosis not present

## 2022-08-10 DIAGNOSIS — N183 Chronic kidney disease, stage 3 unspecified: Secondary | ICD-10-CM | POA: Diagnosis not present

## 2022-08-10 DIAGNOSIS — E119 Type 2 diabetes mellitus without complications: Secondary | ICD-10-CM | POA: Diagnosis not present

## 2022-08-10 DIAGNOSIS — E875 Hyperkalemia: Secondary | ICD-10-CM | POA: Diagnosis not present

## 2022-08-10 DIAGNOSIS — F32A Depression, unspecified: Secondary | ICD-10-CM | POA: Diagnosis not present

## 2022-08-10 DIAGNOSIS — I10 Essential (primary) hypertension: Secondary | ICD-10-CM | POA: Diagnosis not present

## 2022-08-10 DIAGNOSIS — R262 Difficulty in walking, not elsewhere classified: Secondary | ICD-10-CM | POA: Diagnosis not present

## 2022-08-10 DIAGNOSIS — D631 Anemia in chronic kidney disease: Secondary | ICD-10-CM | POA: Diagnosis not present

## 2022-08-10 DIAGNOSIS — N200 Calculus of kidney: Secondary | ICD-10-CM | POA: Diagnosis not present

## 2022-08-10 DIAGNOSIS — R2689 Other abnormalities of gait and mobility: Secondary | ICD-10-CM | POA: Diagnosis not present

## 2022-08-10 DIAGNOSIS — M25511 Pain in right shoulder: Secondary | ICD-10-CM | POA: Diagnosis not present

## 2022-08-10 DIAGNOSIS — K859 Acute pancreatitis without necrosis or infection, unspecified: Secondary | ICD-10-CM | POA: Diagnosis not present

## 2022-08-10 DIAGNOSIS — I130 Hypertensive heart and chronic kidney disease with heart failure and stage 1 through stage 4 chronic kidney disease, or unspecified chronic kidney disease: Secondary | ICD-10-CM | POA: Diagnosis not present

## 2022-08-10 DIAGNOSIS — M6281 Muscle weakness (generalized): Secondary | ICD-10-CM | POA: Diagnosis not present

## 2022-08-10 DIAGNOSIS — E871 Hypo-osmolality and hyponatremia: Secondary | ICD-10-CM | POA: Diagnosis not present

## 2022-08-10 DIAGNOSIS — I2781 Cor pulmonale (chronic): Secondary | ICD-10-CM | POA: Diagnosis not present

## 2022-08-10 DIAGNOSIS — I129 Hypertensive chronic kidney disease with stage 1 through stage 4 chronic kidney disease, or unspecified chronic kidney disease: Secondary | ICD-10-CM | POA: Diagnosis not present

## 2022-08-13 DIAGNOSIS — E871 Hypo-osmolality and hyponatremia: Secondary | ICD-10-CM | POA: Diagnosis not present

## 2022-08-13 DIAGNOSIS — E875 Hyperkalemia: Secondary | ICD-10-CM | POA: Diagnosis not present

## 2022-08-13 DIAGNOSIS — E119 Type 2 diabetes mellitus without complications: Secondary | ICD-10-CM | POA: Diagnosis not present

## 2022-08-13 DIAGNOSIS — R5381 Other malaise: Secondary | ICD-10-CM | POA: Diagnosis not present

## 2022-08-13 DIAGNOSIS — D631 Anemia in chronic kidney disease: Secondary | ICD-10-CM | POA: Diagnosis not present

## 2022-08-13 DIAGNOSIS — K859 Acute pancreatitis without necrosis or infection, unspecified: Secondary | ICD-10-CM | POA: Diagnosis not present

## 2022-08-14 DIAGNOSIS — E875 Hyperkalemia: Secondary | ICD-10-CM | POA: Diagnosis not present

## 2022-08-14 DIAGNOSIS — N200 Calculus of kidney: Secondary | ICD-10-CM | POA: Diagnosis not present

## 2022-08-14 DIAGNOSIS — E871 Hypo-osmolality and hyponatremia: Secondary | ICD-10-CM | POA: Diagnosis not present

## 2022-08-14 DIAGNOSIS — K858 Other acute pancreatitis without necrosis or infection: Secondary | ICD-10-CM | POA: Diagnosis not present

## 2022-08-14 DIAGNOSIS — N183 Chronic kidney disease, stage 3 unspecified: Secondary | ICD-10-CM | POA: Diagnosis not present

## 2022-08-14 DIAGNOSIS — R5381 Other malaise: Secondary | ICD-10-CM | POA: Diagnosis not present

## 2022-08-14 DIAGNOSIS — E119 Type 2 diabetes mellitus without complications: Secondary | ICD-10-CM | POA: Diagnosis not present

## 2022-08-14 DIAGNOSIS — I10 Essential (primary) hypertension: Secondary | ICD-10-CM | POA: Diagnosis not present

## 2022-08-15 DIAGNOSIS — E119 Type 2 diabetes mellitus without complications: Secondary | ICD-10-CM | POA: Diagnosis not present

## 2022-08-15 DIAGNOSIS — F32A Depression, unspecified: Secondary | ICD-10-CM | POA: Diagnosis not present

## 2022-08-15 DIAGNOSIS — I129 Hypertensive chronic kidney disease with stage 1 through stage 4 chronic kidney disease, or unspecified chronic kidney disease: Secondary | ICD-10-CM | POA: Diagnosis not present

## 2022-08-15 DIAGNOSIS — E875 Hyperkalemia: Secondary | ICD-10-CM | POA: Diagnosis not present

## 2022-08-15 DIAGNOSIS — R5381 Other malaise: Secondary | ICD-10-CM | POA: Diagnosis not present

## 2022-08-15 DIAGNOSIS — D631 Anemia in chronic kidney disease: Secondary | ICD-10-CM | POA: Diagnosis not present

## 2022-08-15 DIAGNOSIS — K859 Acute pancreatitis without necrosis or infection, unspecified: Secondary | ICD-10-CM | POA: Diagnosis not present

## 2022-08-15 DIAGNOSIS — E871 Hypo-osmolality and hyponatremia: Secondary | ICD-10-CM | POA: Diagnosis not present

## 2022-08-15 DIAGNOSIS — E1122 Type 2 diabetes mellitus with diabetic chronic kidney disease: Secondary | ICD-10-CM | POA: Diagnosis not present

## 2022-08-17 DIAGNOSIS — E875 Hyperkalemia: Secondary | ICD-10-CM | POA: Diagnosis not present

## 2022-08-17 DIAGNOSIS — D631 Anemia in chronic kidney disease: Secondary | ICD-10-CM | POA: Diagnosis not present

## 2022-08-17 DIAGNOSIS — E871 Hypo-osmolality and hyponatremia: Secondary | ICD-10-CM | POA: Diagnosis not present

## 2022-08-17 DIAGNOSIS — E119 Type 2 diabetes mellitus without complications: Secondary | ICD-10-CM | POA: Diagnosis not present

## 2022-08-17 DIAGNOSIS — K859 Acute pancreatitis without necrosis or infection, unspecified: Secondary | ICD-10-CM | POA: Diagnosis not present

## 2022-08-17 DIAGNOSIS — R5381 Other malaise: Secondary | ICD-10-CM | POA: Diagnosis not present

## 2022-08-20 ENCOUNTER — Emergency Department (HOSPITAL_COMMUNITY): Payer: Medicare HMO

## 2022-08-20 ENCOUNTER — Emergency Department (HOSPITAL_COMMUNITY)
Admission: EM | Admit: 2022-08-20 | Discharge: 2022-08-20 | Disposition: A | Discharge status: Home / Self Care | Payer: Medicare HMO | Attending: Emergency Medicine | Admitting: Emergency Medicine

## 2022-08-20 ENCOUNTER — Encounter (HOSPITAL_COMMUNITY): Payer: Self-pay | Admitting: *Deleted

## 2022-08-20 ENCOUNTER — Other Ambulatory Visit: Payer: Self-pay

## 2022-08-20 DIAGNOSIS — I10 Essential (primary) hypertension: Secondary | ICD-10-CM | POA: Insufficient documentation

## 2022-08-20 DIAGNOSIS — Z043 Encounter for examination and observation following other accident: Secondary | ICD-10-CM | POA: Diagnosis not present

## 2022-08-20 DIAGNOSIS — R600 Localized edema: Secondary | ICD-10-CM | POA: Diagnosis not present

## 2022-08-20 DIAGNOSIS — E8722 Chronic metabolic acidosis: Secondary | ICD-10-CM | POA: Diagnosis not present

## 2022-08-20 DIAGNOSIS — R519 Headache, unspecified: Secondary | ICD-10-CM | POA: Diagnosis not present

## 2022-08-20 DIAGNOSIS — R808 Other proteinuria: Secondary | ICD-10-CM | POA: Diagnosis not present

## 2022-08-20 DIAGNOSIS — S022XXA Fracture of nasal bones, initial encounter for closed fracture: Secondary | ICD-10-CM | POA: Diagnosis not present

## 2022-08-20 DIAGNOSIS — E211 Secondary hyperparathyroidism, not elsewhere classified: Secondary | ICD-10-CM | POA: Diagnosis not present

## 2022-08-20 DIAGNOSIS — W19XXXA Unspecified fall, initial encounter: Secondary | ICD-10-CM

## 2022-08-20 DIAGNOSIS — E875 Hyperkalemia: Secondary | ICD-10-CM | POA: Diagnosis not present

## 2022-08-20 DIAGNOSIS — M25562 Pain in left knee: Secondary | ICD-10-CM | POA: Insufficient documentation

## 2022-08-20 DIAGNOSIS — W01198A Fall on same level from slipping, tripping and stumbling with subsequent striking against other object, initial encounter: Secondary | ICD-10-CM | POA: Diagnosis not present

## 2022-08-20 DIAGNOSIS — N189 Chronic kidney disease, unspecified: Secondary | ICD-10-CM | POA: Diagnosis not present

## 2022-08-20 DIAGNOSIS — I6381 Other cerebral infarction due to occlusion or stenosis of small artery: Secondary | ICD-10-CM | POA: Diagnosis not present

## 2022-08-20 DIAGNOSIS — D631 Anemia in chronic kidney disease: Secondary | ICD-10-CM | POA: Diagnosis not present

## 2022-08-20 DIAGNOSIS — I5032 Chronic diastolic (congestive) heart failure: Secondary | ICD-10-CM | POA: Diagnosis not present

## 2022-08-20 DIAGNOSIS — E1122 Type 2 diabetes mellitus with diabetic chronic kidney disease: Secondary | ICD-10-CM | POA: Diagnosis not present

## 2022-08-20 DIAGNOSIS — M542 Cervicalgia: Secondary | ICD-10-CM | POA: Diagnosis not present

## 2022-08-20 DIAGNOSIS — N170 Acute kidney failure with tubular necrosis: Secondary | ICD-10-CM | POA: Diagnosis not present

## 2022-08-20 MED ORDER — OXYCODONE-ACETAMINOPHEN 5-325 MG PO TABS: ORAL_TABLET | ORAL | 0 refills | Status: DC

## 2022-08-20 MED ORDER — LABETALOL HCL 5 MG/ML IV SOLN
20.0000 mg | Freq: Once | INTRAVENOUS | Status: AC
  Administered 2022-08-20: 20 mg via INTRAVENOUS
  Filled 2022-08-20: qty 4

## 2022-08-20 NOTE — ED Notes (Signed)
Pt to ct 

## 2022-08-20 NOTE — Discharge Instructions (Addendum)
Follow-up with Dr.Cairns within a week for your knee.  Follow-up with your family doctor this week for your blood pressure.  Use a walker to ambulate with.

## 2022-08-20 NOTE — ED Notes (Signed)
Pt back from ct

## 2022-08-20 NOTE — ED Provider Notes (Signed)
[facility] EMERGENCY DEPARTMENT Provider Note   CSN: [id number] Arrival date & time: 08/20/22  1657     History  Chief Complaint  Patient presents with   Heather [name]    Heather [name] is a 71 y.o. female.  Patient has a history of obesity.  She had a fall and hit her face.  She also complains of left knee pain  The history is provided by the patient and medical records. No language interpreter was used.  Fall This is a new problem. The current episode started less than 1 hour ago. The problem occurs constantly. The problem has not changed since onset.Pertinent negatives include no chest pain, no abdominal pain and no headaches. Nothing aggravates the symptoms. Nothing relieves the symptoms. She has tried nothing for the symptoms.       Home Medications Prior to Admission medications   Medication Sig Start Date End Date Taking? Authorizing Provider  oxyCODONE-acetaminophen (PERCOCET/ROXICET) 5-325 MG tablet Take 1 pill every 6 hours for pain has not relieved by Tylenol or Motrin alone 08/20/22  Yes Milton Ferguson, MD  acetaminophen (TYLENOL) 325 MG tablet Take 2 tablets (650 mg total) by mouth every 4 (four) hours. 12/01/21   Johnson, Clanford L, MD  allopurinol (ZYLOPRIM) 100 MG tablet Take 100 mg by mouth daily. 03/20/21   [provider]  Ascorbic Acid (VITAMIN C WITH ROSE HIPS) 500 MG tablet Take 500 mg by mouth every morning.    [provider]  atorvastatin (LIPITOR) 80 MG tablet Take 1 tablet (80 mg total) by mouth daily. Restart on 06/29/22 06/22/22   Orson Eva, MD  busPIRone (BUSPAR) 10 MG tablet Take 10 mg by mouth 3 (three) times daily as needed for anxiety. 03/04/22   [provider]  calcitRIOL (ROCALTROL) 0.25 MCG capsule Take 0.25 mcg by mouth 3 (three) times a week. Monday,Wed and Fri. 10/05/21   [provider]  Cholecalciferol (VITAMIN D) 2000 units CAPS Take 2,000 Units by mouth every morning.     [provider]   cyanocobalamin (,VITAMIN B-12,) 1000 MCG/ML injection Inject 1,000 mcg into the muscle every 30 (thirty) days.      [provider]  dicyclomine (BENTYL) 20 MG tablet Take 1 tablet (20 mg total) by mouth in the morning and at bedtime. 12/01/21   Johnson, Clanford L, MD  diphenhydrAMINE (BENADRYL) 2 % cream Apply topically 3 (three) times daily as needed for itching. 12/01/21   Johnson, Clanford L, MD  esomeprazole (NEXIUM) 40 MG capsule Take 40 mg by mouth daily before breakfast.      [provider]  FARXIGA 10 MG TABS tablet Take 10 mg by mouth daily. 07/11/22   [provider]  furosemide (LASIX) 40 MG tablet Take 1 tablet (40 mg total) by mouth daily. 06/22/22   Orson Eva, MD  gabapentin (NEURONTIN) 300 MG capsule Take 1 capsule (300 mg total) by mouth at bedtime. 05/27/22   Emokpae, [name], MD  glipiZIDE (GLUCOTROL) 5 MG tablet Take 5 mg by mouth daily. 07/18/22   [provider]  hydrALAZINE (APRESOLINE) 50 MG tablet Take 1 tablet (50 mg total) by mouth 2 (two) times daily. 06/22/22   Orson Eva, MD  insulin aspart (NOVOLOG) 100 UNIT/ML FlexPen Inject 5-11 Units into the skin 3 (three) times daily with meals. 07/09/22   Brita Romp, NP  insulin detemir (LEVEMIR FLEXPEN) 100 UNIT/ML FlexPen Inject 20 Units into the skin at bedtime. 07/10/22   Brita Romp, NP  levETIRAcetam (KEPPRA) 500 MG tablet Take 500 mg by mouth 2 (two) times daily.    [provider]  metoprolol succinate (TOPROL XL) 100 MG 24 hr tablet Take 1 tablet (100 mg total) by mouth daily. Take with or immediately following a meal. 05/27/22 05/27/23  Emokpae, [name], MD  potassium chloride SA (KLOR-CON M) 20 MEQ tablet Take 20 mEq by mouth 3 (three) times a week. 07/11/22   [provider]  spironolactone (ALDACTONE) 25 MG tablet Take 25 mg by mouth daily. 07/18/22   [provider]  temazepam (RESTORIL) 15 MG capsule Take 15 mg by mouth at bedtime as needed. 06/10/22    [provider]  tiZANidine (ZANAFLEX) 4 MG tablet Take 4 mg by mouth 2 (two) times daily. 06/14/22   [provider]  valsartan (DIOVAN) 80 MG tablet Take 80 mg by mouth daily. 05/17/22   [provider]  venlafaxine XR (EFFEXOR-XR) 150 MG 24 hr capsule Take 150 mg by mouth daily. 07/11/22   [provider]  Vitamin D, Ergocalciferol, (DRISDOL) 1.25 MG (50000 UNIT) CAPS capsule Take 50,000 Units by mouth once a week. 07/09/22   [provider]  ziprasidone (GEODON) 20 MG capsule Take 20 mg by mouth 2 (two) times daily. 06/09/22   [provider]      Allergies    Cozaar [losartan], Quinolones, Cymbalta [duloxetine hcl], Sulfa antibiotics, Synalar [fluocinolone], Cipro [ciprofloxacin hcl], Diovan [valsartan], Glucophage [metformin], Ketalar [ketamine], Norco [hydrocodone-acetaminophen], Norvasc [amlodipine besylate], Nsaids, Tape, and Zestril [lisinopril]    Review of Systems   Review of Systems  Constitutional:  Negative for appetite change and fatigue.  HENT:  Negative for congestion, ear discharge and sinus pressure.   Eyes:  Negative for discharge.  Respiratory:  Negative for cough.   Cardiovascular:  Negative for chest pain.  Gastrointestinal:  Negative for abdominal pain and diarrhea.  Genitourinary:  Negative for frequency and hematuria.  Musculoskeletal:  Negative for back pain.       Left knee and face pain  Skin:  Negative for rash.  Neurological:  Negative for seizures and headaches.  Psychiatric/Behavioral:  Negative for hallucinations.     Physical Exam Updated Vital Signs BP (!) 211/83   Pulse 88   Temp 98.1 F (36.7 C) (Oral)   Resp 20   Ht '5\' 2"'$  (1.575 m)   Wt 56.7 kg   LMP  (LMP Unknown)   SpO2 99%   BMI 22.86 kg/m  Physical Exam Vitals and nursing note reviewed.  Constitutional:      Appearance: She is well-developed.  HENT:     Head: Normocephalic.     Comments: Tender face    Nose: Nose normal.   Eyes:     General: No scleral icterus.    Conjunctiva/sclera: Conjunctivae normal.  Neck:     Thyroid: No thyromegaly.  Cardiovascular:     Rate and Rhythm: Normal rate and regular rhythm.     Heart sounds: No murmur heard.    No friction rub. No gallop.  Pulmonary:     Breath sounds: No stridor. No wheezing or rales.  Chest:     Chest wall: No tenderness.  Abdominal:     General: There is no distension.     Tenderness: There is no abdominal tenderness. There is no rebound.  Musculoskeletal:        General: Normal range of motion.     Cervical back: Neck supple.     Comments: Left knee tenderness  Lymphadenopathy:     Cervical: No cervical adenopathy.  Skin:    Findings: No erythema or rash.  Neurological:     Mental Status: She is alert and oriented to person, place, and time.     Motor: No abnormal muscle tone.     Coordination: Coordination normal.  Psychiatric:        Behavior: Behavior normal.     ED Results / Procedures / Treatments   Labs (all labs ordered are listed, but only abnormal results are displayed) Labs Reviewed - No data to display  EKG None  Radiology CT Knee Left Wo Contrast  Result Date: 08/20/2022 CLINICAL DATA:  Trauma. EXAM: CT OF THE LEFT KNEE WITHOUT CONTRAST TECHNIQUE: Multidetector CT imaging of the left knee was performed according to the standard protocol. Multiplanar CT image reconstructions were also generated. RADIATION DOSE REDUCTION: This exam was performed according to the departmental dose-optimization program which includes automated exposure control, adjustment of the mA and/or kV according to patient size and/or use of iterative reconstruction technique. COMPARISON:  Left knee radiograph dated 08/20/2022. FINDINGS: Bones/Joint/Cartilage Nondisplaced transverse fracture of the patella. No other acute fracture. There is no dislocation. There is advanced osteopenia. There is moderate arthritic changes with tricompartmental narrowing  and spurring. Moderate size suprapatellar effusion, possibly lipohemarthrosis. Ligaments Suboptimally assessed by CT. Muscles and Tendons No acute findings. Soft tissues Atherosclerotic calcification of the vasculature. Mild subcutaneous edema of the anterior knee. IMPRESSION: 1. Nondisplaced transverse fracture of the patella. 2. Moderate size suprapatellar effusion, possibly lipohemarthrosis. Electronically Signed   By: Anner Crete M.D.   On: 08/20/2022 21:07   CT Maxillofacial Wo Contrast  Result Date: 08/20/2022 CLINICAL DATA:  Recent trip and fall with headaches and facial pain, initial encounter EXAM: CT HEAD WITHOUT CONTRAST CT MAXILLOFACIAL WITHOUT CONTRAST CT CERVICAL SPINE WITHOUT CONTRAST TECHNIQUE: Multidetector CT imaging of the head, cervical spine, and maxillofacial structures were performed using the standard protocol without intravenous contrast. Multiplanar CT image reconstructions of the cervical spine and maxillofacial structures were also generated. RADIATION DOSE REDUCTION: This exam was performed according to the departmental dose-optimization program which includes automated exposure control, adjustment of the mA and/or kV according to patient size and/or use of iterative reconstruction technique. COMPARISON:  11/28/2021, 06/20/2022, 08/22/2015 FINDINGS: CT HEAD FINDINGS Brain: No evidence of acute infarction, hemorrhage, hydrocephalus, extra-axial collection or mass lesion/mass effect. Mild atrophic changes are noted. Chronic lacunar infarct in the left basal ganglia is seen. Vascular: No hyperdense vessel or unexpected calcification. Skull: Normal. Negative for fracture or focal lesion. Other: None. CT MAXILLOFACIAL FINDINGS Osseous: Bilateral nasal bone fractures are seen with mild displacement. No other fractures are noted. Orbits: Orbits and their contents are within normal limits. Sinuses: Paranasal sinuses are unremarkable. Soft tissues: Surrounding soft tissue structures  show no focal hematoma. CT CERVICAL SPINE FINDINGS Alignment: Within normal limits Skull base and vertebrae: Postsurgical changes are noted at C3-4 with anterior fixation. Interbody fusion is noted from C3 to T1. Anterior fixation at C7-T1 is noted. Posterior fixation is noted as well. No acute fracture or acute facet abnormality is noted. Multilevel osteophytic change and facet hypertrophic change is noted. The odontoid is within normal limits. Stable well corticated bony densities are noted between the articular surface of the facet at C1 on the left and C2. These are stable from a prior exam and may be related to prior trauma. Soft tissues and spinal canal: Surrounding soft tissue structures show vascular calcifications. No other focal abnormality is noted. Upper  chest: Visualized lung apices are within normal limits. Other: None IMPRESSION: CT of the head: No acute intracranial abnormality noted. CT of the maxillofacial bones: Bilateral nasal bone fractures with mild displacement. CT of the cervical spine: Postsurgical and degenerative changes without acute abnormality. Electronically Signed   By: Inez Catalina M.D.   On: 08/20/2022 20:55   CT Head Wo Contrast  Result Date: 08/20/2022 CLINICAL DATA:  Recent trip and fall with headaches and facial pain, initial encounter EXAM: CT HEAD WITHOUT CONTRAST CT MAXILLOFACIAL WITHOUT CONTRAST CT CERVICAL SPINE WITHOUT CONTRAST TECHNIQUE: Multidetector CT imaging of the head, cervical spine, and maxillofacial structures were performed using the standard protocol without intravenous contrast. Multiplanar CT image reconstructions of the cervical spine and maxillofacial structures were also generated. RADIATION DOSE REDUCTION: This exam was performed according to the departmental dose-optimization program which includes automated exposure control, adjustment of the mA and/or kV according to patient size and/or use of iterative reconstruction technique. COMPARISON:   11/28/2021, 06/20/2022, 08/22/2015 FINDINGS: CT HEAD FINDINGS Brain: No evidence of acute infarction, hemorrhage, hydrocephalus, extra-axial collection or mass lesion/mass effect. Mild atrophic changes are noted. Chronic lacunar infarct in the left basal ganglia is seen. Vascular: No hyperdense vessel or unexpected calcification. Skull: Normal. Negative for fracture or focal lesion. Other: None. CT MAXILLOFACIAL FINDINGS Osseous: Bilateral nasal bone fractures are seen with mild displacement. No other fractures are noted. Orbits: Orbits and their contents are within normal limits. Sinuses: Paranasal sinuses are unremarkable. Soft tissues: Surrounding soft tissue structures show no focal hematoma. CT CERVICAL SPINE FINDINGS Alignment: Within normal limits Skull base and vertebrae: Postsurgical changes are noted at C3-4 with anterior fixation. Interbody fusion is noted from C3 to T1. Anterior fixation at C7-T1 is noted. Posterior fixation is noted as well. No acute fracture or acute facet abnormality is noted. Multilevel osteophytic change and facet hypertrophic change is noted. The odontoid is within normal limits. Stable well corticated bony densities are noted between the articular surface of the facet at C1 on the left and C2. These are stable from a prior exam and may be related to prior trauma. Soft tissues and spinal canal: Surrounding soft tissue structures show vascular calcifications. No other focal abnormality is noted. Upper chest: Visualized lung apices are within normal limits. Other: None IMPRESSION: CT of the head: No acute intracranial abnormality noted. CT of the maxillofacial bones: Bilateral nasal bone fractures with mild displacement. CT of the cervical spine: Postsurgical and degenerative changes without acute abnormality. Electronically Signed   By: Inez Catalina M.D.   On: 08/20/2022 20:55   CT Cervical Spine Wo Contrast  Result Date: 08/20/2022 CLINICAL DATA:  Recent trip and fall with  headaches and facial pain, initial encounter EXAM: CT HEAD WITHOUT CONTRAST CT MAXILLOFACIAL WITHOUT CONTRAST CT CERVICAL SPINE WITHOUT CONTRAST TECHNIQUE: Multidetector CT imaging of the head, cervical spine, and maxillofacial structures were performed using the standard protocol without intravenous contrast. Multiplanar CT image reconstructions of the cervical spine and maxillofacial structures were also generated. RADIATION DOSE REDUCTION: This exam was performed according to the departmental dose-optimization program which includes automated exposure control, adjustment of the mA and/or kV according to patient size and/or use of iterative reconstruction technique. COMPARISON:  11/28/2021, 06/20/2022, 08/22/2015 FINDINGS: CT HEAD FINDINGS Brain: No evidence of acute infarction, hemorrhage, hydrocephalus, extra-axial collection or mass lesion/mass effect. Mild atrophic changes are noted. Chronic lacunar infarct in the left basal ganglia is seen. Vascular: No hyperdense vessel or unexpected calcification. Skull: Normal. Negative for fracture or  focal lesion. Other: None. CT MAXILLOFACIAL FINDINGS Osseous: Bilateral nasal bone fractures are seen with mild displacement. No other fractures are noted. Orbits: Orbits and their contents are within normal limits. Sinuses: Paranasal sinuses are unremarkable. Soft tissues: Surrounding soft tissue structures show no focal hematoma. CT CERVICAL SPINE FINDINGS Alignment: Within normal limits Skull base and vertebrae: Postsurgical changes are noted at C3-4 with anterior fixation. Interbody fusion is noted from C3 to T1. Anterior fixation at C7-T1 is noted. Posterior fixation is noted as well. No acute fracture or acute facet abnormality is noted. Multilevel osteophytic change and facet hypertrophic change is noted. The odontoid is within normal limits. Stable well corticated bony densities are noted between the articular surface of the facet at C1 on the left and C2. These are  stable from a prior exam and may be related to prior trauma. Soft tissues and spinal canal: Surrounding soft tissue structures show vascular calcifications. No other focal abnormality is noted. Upper chest: Visualized lung apices are within normal limits. Other: None IMPRESSION: CT of the head: No acute intracranial abnormality noted. CT of the maxillofacial bones: Bilateral nasal bone fractures with mild displacement. CT of the cervical spine: Postsurgical and degenerative changes without acute abnormality. Electronically Signed   By: Inez Catalina M.D.   On: 08/20/2022 20:55   DG Knee Complete 4 Views Left  Result Date: 08/20/2022 CLINICAL DATA:  71 year old female with a fall EXAM: LEFT KNEE - COMPLETE 4+ VIEW COMPARISON:  None Available. FINDINGS: No acute displaced fracture of distal femur, or the proximal tibia, fibula. Lateral view demonstrates a questionable fracture line through the patella with no comparison available. Joint effusion present with prepatellar soft tissue swelling. Atherosclerosis of the femoropopliteal and tibial arteries. IMPRESSION: Questionable nondisplaced fracture of the patella, with associated joint effusion. Further evaluation with CT may be useful. Tricompartmental osteoarthritis. Atherosclerosis Electronically Signed   By: Corrie Mckusick D.O.   On: 08/20/2022 17:34    Procedures Procedures    Medications Ordered in ED Medications  labetalol (NORMODYNE) injection 20 mg (20 mg Intravenous Given 08/20/22 2239)    ED Course/ Medical Decision Making/ A&P                           Medical Decision Making Amount and/or Complexity of Data Reviewed Radiology: ordered.  Risk Prescription drug management.  This patient presents to the ED for concern of fall, this involves an extensive number of treatment options, and is a complaint that carries with it a high risk of complications and morbidity.  The differential diagnosis includes head injury, left knee  injury   Co morbidities that complicate the patient evaluation  Obesity and hypertension   Additional history obtained:  Additional history obtained from patient External records from outside source obtained and reviewed including hospital records   Lab Tests:  No labs  Imaging Studies ordered:  I ordered imaging studies including CT head cervical spine maxillofacial and DG right knee I independently visualized and interpreted imaging which showed negative I agree with the radiologist interpretation   Cardiac Monitoring: / EKG:  The patient was maintained on a cardiac monitor.  I personally viewed and interpreted the cardiac monitored which showed an underlying rhythm of: Normal sinus rhythm   Consultations Obtained:  No consultant  Problem List / ED Course / Critical interventions / Medication management  Fall and hypertension I ordered medication including labetalol for blood pressure Reevaluation of the patient after these medicines  showed that the patient improved I have reviewed the patients home medicines and have made adjustments as needed   Social Determinants of Health:  None   Test / Admission - Considered:  None  Patient with contusion to face and left knee.  She is to follow-up with orthopedics for her knee and her family doctor for her hypertension        Final Clinical Impression(s) / ED Diagnoses Final diagnoses:  Fall, initial encounter  Primary hypertension    Rx / DC Orders ED Discharge Orders          Ordered    oxyCODONE-acetaminophen (PERCOCET/ROXICET) 5-325 MG tablet        08/20/22 2314              Milton Ferguson, MD 08/26/22 1227

## 2022-08-20 NOTE — ED Notes (Signed)
Pt able to stand and move from bed to wc to toilet

## 2022-08-20 NOTE — ED Triage Notes (Signed)
Pt tripped on sidewalk falling face forward, denies LOC.  Also with left knee pain as well. Denies any blood thinners.

## 2022-08-22 ENCOUNTER — Ambulatory Visit (INDEPENDENT_AMBULATORY_CARE_PROVIDER_SITE_OTHER): Payer: Medicare HMO | Admitting: Orthopedic Surgery

## 2022-08-22 VITALS — Ht 62.0 in | Wt 125.0 lb

## 2022-08-22 DIAGNOSIS — S82035A Nondisplaced transverse fracture of left patella, initial encounter for closed fracture: Secondary | ICD-10-CM

## 2022-08-22 DIAGNOSIS — R55 Syncope and collapse: Secondary | ICD-10-CM | POA: Diagnosis not present

## 2022-08-22 DIAGNOSIS — I517 Cardiomegaly: Secondary | ICD-10-CM | POA: Diagnosis not present

## 2022-08-22 DIAGNOSIS — M79661 Pain in right lower leg: Secondary | ICD-10-CM | POA: Diagnosis not present

## 2022-08-22 DIAGNOSIS — E1122 Type 2 diabetes mellitus with diabetic chronic kidney disease: Secondary | ICD-10-CM | POA: Diagnosis not present

## 2022-08-22 DIAGNOSIS — S82035D Nondisplaced transverse fracture of left patella, subsequent encounter for closed fracture with routine healing: Secondary | ICD-10-CM | POA: Diagnosis not present

## 2022-08-22 DIAGNOSIS — I130 Hypertensive heart and chronic kidney disease with heart failure and stage 1 through stage 4 chronic kidney disease, or unspecified chronic kidney disease: Secondary | ICD-10-CM | POA: Diagnosis not present

## 2022-08-22 DIAGNOSIS — R079 Chest pain, unspecified: Secondary | ICD-10-CM | POA: Diagnosis not present

## 2022-08-22 DIAGNOSIS — I5189 Other ill-defined heart diseases: Secondary | ICD-10-CM | POA: Diagnosis not present

## 2022-08-22 DIAGNOSIS — I10 Essential (primary) hypertension: Secondary | ICD-10-CM | POA: Diagnosis not present

## 2022-08-22 DIAGNOSIS — S82032D Displaced transverse fracture of left patella, subsequent encounter for closed fracture with routine healing: Secondary | ICD-10-CM | POA: Diagnosis not present

## 2022-08-22 DIAGNOSIS — K219 Gastro-esophageal reflux disease without esophagitis: Secondary | ICD-10-CM | POA: Diagnosis not present

## 2022-08-22 DIAGNOSIS — E119 Type 2 diabetes mellitus without complications: Secondary | ICD-10-CM | POA: Diagnosis not present

## 2022-08-22 DIAGNOSIS — S82002D Unspecified fracture of left patella, subsequent encounter for closed fracture with routine healing: Secondary | ICD-10-CM | POA: Diagnosis not present

## 2022-08-22 DIAGNOSIS — I129 Hypertensive chronic kidney disease with stage 1 through stage 4 chronic kidney disease, or unspecified chronic kidney disease: Secondary | ICD-10-CM | POA: Diagnosis not present

## 2022-08-22 DIAGNOSIS — Z6822 Body mass index (BMI) 22.0-22.9, adult: Secondary | ICD-10-CM | POA: Diagnosis not present

## 2022-08-22 DIAGNOSIS — S82002A Unspecified fracture of left patella, initial encounter for closed fracture: Secondary | ICD-10-CM | POA: Diagnosis not present

## 2022-08-22 DIAGNOSIS — E785 Hyperlipidemia, unspecified: Secondary | ICD-10-CM | POA: Diagnosis not present

## 2022-08-22 DIAGNOSIS — M79662 Pain in left lower leg: Secondary | ICD-10-CM | POA: Diagnosis not present

## 2022-08-22 DIAGNOSIS — Z794 Long term (current) use of insulin: Secondary | ICD-10-CM | POA: Diagnosis not present

## 2022-08-22 DIAGNOSIS — S022XXD Fracture of nasal bones, subsequent encounter for fracture with routine healing: Secondary | ICD-10-CM | POA: Diagnosis not present

## 2022-08-22 DIAGNOSIS — E669 Obesity, unspecified: Secondary | ICD-10-CM | POA: Diagnosis not present

## 2022-08-22 DIAGNOSIS — Z79899 Other long term (current) drug therapy: Secondary | ICD-10-CM | POA: Diagnosis not present

## 2022-08-22 DIAGNOSIS — I6340 Cerebral infarction due to embolism of unspecified cerebral artery: Secondary | ICD-10-CM | POA: Diagnosis not present

## 2022-08-22 DIAGNOSIS — S0083XD Contusion of other part of head, subsequent encounter: Secondary | ICD-10-CM | POA: Diagnosis not present

## 2022-08-22 DIAGNOSIS — Z8673 Personal history of transient ischemic attack (TIA), and cerebral infarction without residual deficits: Secondary | ICD-10-CM | POA: Diagnosis not present

## 2022-08-22 DIAGNOSIS — E782 Mixed hyperlipidemia: Secondary | ICD-10-CM | POA: Diagnosis not present

## 2022-08-22 DIAGNOSIS — K5090 Crohn's disease, unspecified, without complications: Secondary | ICD-10-CM | POA: Diagnosis not present

## 2022-08-22 DIAGNOSIS — S82032A Displaced transverse fracture of left patella, initial encounter for closed fracture: Secondary | ICD-10-CM | POA: Diagnosis not present

## 2022-08-22 DIAGNOSIS — E876 Hypokalemia: Secondary | ICD-10-CM | POA: Diagnosis not present

## 2022-08-22 DIAGNOSIS — I272 Pulmonary hypertension, unspecified: Secondary | ICD-10-CM | POA: Diagnosis not present

## 2022-08-22 DIAGNOSIS — I4891 Unspecified atrial fibrillation: Secondary | ICD-10-CM | POA: Diagnosis not present

## 2022-08-22 DIAGNOSIS — G319 Degenerative disease of nervous system, unspecified: Secondary | ICD-10-CM | POA: Diagnosis not present

## 2022-08-22 DIAGNOSIS — W19XXXD Unspecified fall, subsequent encounter: Secondary | ICD-10-CM | POA: Diagnosis not present

## 2022-08-22 DIAGNOSIS — M25462 Effusion, left knee: Secondary | ICD-10-CM | POA: Diagnosis not present

## 2022-08-22 DIAGNOSIS — I6381 Other cerebral infarction due to occlusion or stenosis of small artery: Secondary | ICD-10-CM | POA: Diagnosis not present

## 2022-08-22 DIAGNOSIS — X58XXXD Exposure to other specified factors, subsequent encounter: Secondary | ICD-10-CM | POA: Diagnosis not present

## 2022-08-22 DIAGNOSIS — I5032 Chronic diastolic (congestive) heart failure: Secondary | ICD-10-CM | POA: Diagnosis not present

## 2022-08-22 DIAGNOSIS — N183 Chronic kidney disease, stage 3 unspecified: Secondary | ICD-10-CM | POA: Diagnosis not present

## 2022-08-22 DIAGNOSIS — I959 Hypotension, unspecified: Secondary | ICD-10-CM | POA: Diagnosis not present

## 2022-08-22 DIAGNOSIS — N182 Chronic kidney disease, stage 2 (mild): Secondary | ICD-10-CM | POA: Diagnosis not present

## 2022-08-22 DIAGNOSIS — R001 Bradycardia, unspecified: Secondary | ICD-10-CM | POA: Diagnosis not present

## 2022-08-23 ENCOUNTER — Encounter (HOSPITAL_COMMUNITY): Admission: RE | Admit: 2022-08-23 | Payer: Medicare HMO | Source: Ambulatory Visit

## 2022-08-23 DIAGNOSIS — N184 Chronic kidney disease, stage 4 (severe): Secondary | ICD-10-CM

## 2022-08-23 DIAGNOSIS — N179 Acute kidney failure, unspecified: Secondary | ICD-10-CM | POA: Insufficient documentation

## 2022-08-23 DIAGNOSIS — N189 Chronic kidney disease, unspecified: Secondary | ICD-10-CM | POA: Insufficient documentation

## 2022-08-23 NOTE — Progress Notes (Signed)
Orthopaedic Clinic Return  Assessment: Heather [name] is a 71 y.o. female with the following: Left, nondisplaced patella fracture  Plan: Mrs. Mantel fell and sustained a nondisplaced fracture of the left patella.  She was briefly evaluated in clinic today.  She was noted to have an Ace wrap only on her left knee.  She was placed in a knee immobilizer.  However, her evaluation was severely limited due to her medical condition.  She was found nonresponsive and very sleepy on multiple occasions.  She was able to be aroused.  She answered some questions appropriately.  She stated she had to go to the restroom, and as we were trying to assist her to the restroom, she had a syncopal episode.  Based on these multiple episodes, we called EMS.  She was evaluated by EMS, subsequently taken to the emergency department for further evaluation.  Overall, her evaluation was not complete.  Based on my review of the radiographs, and her ability to maintain a straight leg raise, anticipate we will be able to treat her left patella injury without surgery.  She is to remain in the knee immobilizer, without weightbearing.  Once he is medically stable, we can schedule an appointment.  There is no urgency regarding her follow-up, provide she continues to use the knee immobilizer.  Follow-up: Return for When she is medically stable.   Subjective:  Chief Complaint  Patient presents with   Fracture    Lt Patella fx after a fall in [place] 08/20/22. Pt came into office with unstable gait and multiple syncopal episodes. [name] notified and pt was examined, EMS called.     History of Present Illness: Heather [name] is a 71 y.o. female who returns to clinic for evaluation of left knee injury.  Per available reports, she fell, sustained an injury to her left knee.  Radiographs and CT scan demonstrates a nondisplaced fracture of the left patella.  She was unable to fully answer questions.  She was wearing  an Ace wrap on her left knee only.  She has been bending her knee.  Review of Systems: Unable to assess  Objective: Ht '5\' 2"'$  (1.575 m)   Wt 125 lb (56.7 kg)   LMP  (LMP Unknown)   BMI 22.86 kg/m   Physical Exam:  Patient was found unresponsive.  We are able to arouse her.  She did respond to questions, as well as follow commands.  She has bruising to both eyes.  She was able to maintain a straight leg raise with her left leg.  She was unable to walk without assistance.  IMAGING: I personally ordered and reviewed the following images:  X-rays CT scan from the emergency department demonstrates a nondisplaced fracture of the left patella.    Mordecai Rasmussen, MD 08/23/2022 10:30 AM

## 2022-08-27 ENCOUNTER — Telehealth: Payer: Self-pay

## 2022-08-27 NOTE — Telephone Encounter (Signed)
        Patient  visited [place] [place] on 10/23    Telephone encounter attempt :  1st  A HIPAA compliant voice message was left requesting a return call.  Instructed patient to call back     [place], [employer]  [phone] [address], [place], [place] [address] Phone: [phone] Email: [email or web address]

## 2022-08-28 ENCOUNTER — Telehealth: Payer: Self-pay

## 2022-08-28 DIAGNOSIS — I10 Essential (primary) hypertension: Secondary | ICD-10-CM | POA: Diagnosis not present

## 2022-08-28 DIAGNOSIS — E7800 Pure hypercholesterolemia, unspecified: Secondary | ICD-10-CM | POA: Diagnosis not present

## 2022-08-28 NOTE — Telephone Encounter (Signed)
        Patient  visited [facility] on 10/23   Telephone encounter attempt :  2nd  A HIPAA compliant voice message was left requesting a return call.  Instructed patient to call back     [place], [facility] [employer]  [phone] [address], [place], [place] [address] Phone: [phone] Email: [email or web address]

## 2022-08-29 DIAGNOSIS — Z515 Encounter for palliative care: Secondary | ICD-10-CM | POA: Diagnosis not present

## 2022-08-29 DIAGNOSIS — I5032 Chronic diastolic (congestive) heart failure: Secondary | ICD-10-CM | POA: Diagnosis not present

## 2022-08-30 ENCOUNTER — Emergency Department (HOSPITAL_COMMUNITY)
Admission: EM | Admit: 2022-08-30 | Discharge: 2022-08-30 | Disposition: A | Discharge status: Home / Self Care | Payer: Medicare HMO | Attending: Emergency Medicine | Admitting: Emergency Medicine

## 2022-08-30 ENCOUNTER — Encounter (HOSPITAL_COMMUNITY)
Admission: RE | Admit: 2022-08-30 | Discharge: 2022-08-30 | Disposition: A | Discharge status: Home / Self Care | Payer: Medicare HMO | Source: Ambulatory Visit | Attending: Nephrology | Admitting: Nephrology

## 2022-08-30 ENCOUNTER — Encounter (HOSPITAL_COMMUNITY): Payer: Self-pay | Admitting: Emergency Medicine

## 2022-08-30 ENCOUNTER — Emergency Department (HOSPITAL_COMMUNITY): Payer: Medicare HMO

## 2022-08-30 ENCOUNTER — Other Ambulatory Visit: Payer: Self-pay

## 2022-08-30 DIAGNOSIS — W19XXXD Unspecified fall, subsequent encounter: Secondary | ICD-10-CM | POA: Insufficient documentation

## 2022-08-30 DIAGNOSIS — I129 Hypertensive chronic kidney disease with stage 1 through stage 4 chronic kidney disease, or unspecified chronic kidney disease: Secondary | ICD-10-CM | POA: Insufficient documentation

## 2022-08-30 DIAGNOSIS — M79642 Pain in left hand: Secondary | ICD-10-CM | POA: Insufficient documentation

## 2022-08-30 DIAGNOSIS — N189 Chronic kidney disease, unspecified: Secondary | ICD-10-CM | POA: Diagnosis not present

## 2022-08-30 DIAGNOSIS — N184 Chronic kidney disease, stage 4 (severe): Secondary | ICD-10-CM | POA: Insufficient documentation

## 2022-08-30 DIAGNOSIS — Z794 Long term (current) use of insulin: Secondary | ICD-10-CM | POA: Insufficient documentation

## 2022-08-30 DIAGNOSIS — Z043 Encounter for examination and observation following other accident: Secondary | ICD-10-CM | POA: Diagnosis not present

## 2022-08-30 NOTE — Discharge Instructions (Addendum)
You were evaluated in the emergency department today for continued left hand pain  X-ray imaging today did not show an acute fracture.  You were given the contact information for a local orthopedic specialist, the same orthopedic specialist you saw previously, to follow-up with if pain remains in the next week.  Continue to manage pain and symptoms with RICE therapy and Tylenol.  Try to avoid anti-inflammatories such as ibuprofen and Aleve with your kidney dysfunction.  Return to the ED for new or worsening symptoms as discussed.

## 2022-08-30 NOTE — ED Triage Notes (Signed)
Pt reports a fall last week, reports left hand pain since fall, was seen and treated last week post-fall but reports he left hand was not x-rayed

## 2022-08-30 NOTE — ED Provider Notes (Signed)
[facility] EMERGENCY DEPARTMENT Provider Note   CSN: [id number] Arrival date & time: 08/30/22  1846     History  Chief Complaint  Patient presents with   Hand Pain    Heather [name] is a 71 y.o. female presenting to the ED today due to continued left hand pain.  Has improved overall, though continues to be painful.  [place] last week and was evaluated/treated after the fall.  However states hand was not evaluated with x-ray imaging.  Requesting evaluation with x-ray today.  Denies numbness, tingling, or weakness of the hand.  No other complaints at this time.   Hand Pain      Home Medications Prior to Admission medications   Medication Sig Start Date End Date Taking? Authorizing Provider  acetaminophen (TYLENOL) 325 MG tablet Take 2 tablets (650 mg total) by mouth every 4 (four) hours. 12/01/21   Johnson, Clanford L, MD  allopurinol (ZYLOPRIM) 100 MG tablet Take 100 mg by mouth daily. 03/20/21   [provider]  Ascorbic Acid (VITAMIN C WITH ROSE HIPS) 500 MG tablet Take 500 mg by mouth every morning.    [provider]  atorvastatin (LIPITOR) 80 MG tablet Take 1 tablet (80 mg total) by mouth daily. Restart on 06/29/22 06/22/22   Orson Eva, MD  busPIRone (BUSPAR) 10 MG tablet Take 10 mg by mouth 3 (three) times daily as needed for anxiety. 03/04/22   [provider]  calcitRIOL (ROCALTROL) 0.25 MCG capsule Take 0.25 mcg by mouth 3 (three) times a week. Monday,Wed and Fri. 10/05/21   [provider]  Cholecalciferol (VITAMIN D) 2000 units CAPS Take 2,000 Units by mouth every morning.     [provider]  cyanocobalamin (,VITAMIN B-12,) 1000 MCG/ML injection Inject 1,000 mcg into the muscle every 30 (thirty) days.      [provider]  dicyclomine (BENTYL) 20 MG tablet Take 1 tablet (20 mg total) by mouth in the morning and at bedtime. 12/01/21   Johnson, Clanford L, MD  diphenhydrAMINE (BENADRYL) 2 % cream Apply topically 3 (three)  times daily as needed for itching. 12/01/21   Johnson, Clanford L, MD  esomeprazole (NEXIUM) 40 MG capsule Take 40 mg by mouth daily before breakfast.      [provider]  FARXIGA 10 MG TABS tablet Take 10 mg by mouth daily. 07/11/22   [provider]  furosemide (LASIX) 40 MG tablet Take 1 tablet (40 mg total) by mouth daily. 06/22/22   Orson Eva, MD  gabapentin (NEURONTIN) 300 MG capsule Take 1 capsule (300 mg total) by mouth at bedtime. 05/27/22   Emokpae, [name], MD  glipiZIDE (GLUCOTROL) 5 MG tablet Take 5 mg by mouth daily. 07/18/22   [provider]  hydrALAZINE (APRESOLINE) 50 MG tablet Take 1 tablet (50 mg total) by mouth 2 (two) times daily. 06/22/22   Orson Eva, MD  insulin aspart (NOVOLOG) 100 UNIT/ML FlexPen Inject 5-11 Units into the skin 3 (three) times daily with meals. 07/09/22   Brita Romp, NP  insulin detemir (LEVEMIR FLEXPEN) 100 UNIT/ML FlexPen Inject 20 Units into the skin at bedtime. 07/10/22   Brita Romp, NP  levETIRAcetam (KEPPRA) 500 MG tablet Take 500 mg by mouth 2 (two) times daily.    [provider]  metoprolol succinate (TOPROL XL) 100 MG 24 hr tablet Take 1 tablet (100 mg total) by mouth daily. Take with or immediately following a meal. 05/27/22 05/27/23  Roxan Hockey, MD  oxyCODONE-acetaminophen (PERCOCET/ROXICET)  5-325 MG tablet Take 1 pill every 6 hours for pain has not relieved by Tylenol or Motrin alone 08/20/22   Milton Ferguson, MD  potassium chloride SA (KLOR-CON M) 20 MEQ tablet Take 20 mEq by mouth 3 (three) times a week. 07/11/22   [provider]  spironolactone (ALDACTONE) 25 MG tablet Take 25 mg by mouth daily. 07/18/22   [provider]  temazepam (RESTORIL) 15 MG capsule Take 15 mg by mouth at bedtime as needed. 06/10/22   [provider]  tiZANidine (ZANAFLEX) 4 MG tablet Take 4 mg by mouth 2 (two) times daily. 06/14/22   [provider]  valsartan (DIOVAN) 80 MG tablet  Take 80 mg by mouth daily. 05/17/22   [provider]  venlafaxine XR (EFFEXOR-XR) 150 MG 24 hr capsule Take 150 mg by mouth daily. 07/11/22   [provider]  Vitamin D, Ergocalciferol, (DRISDOL) 1.25 MG (50000 UNIT) CAPS capsule Take 50,000 Units by mouth once a week. 07/09/22   [provider]  ziprasidone (GEODON) 20 MG capsule Take 20 mg by mouth 2 (two) times daily. 06/09/22   [provider]      Allergies    Cozaar [losartan], Quinolones, Cymbalta [duloxetine hcl], Sulfa antibiotics, Synalar [fluocinolone], Cipro [ciprofloxacin hcl], Diovan [valsartan], Glucophage [metformin], Ketalar [ketamine], Norco [hydrocodone-acetaminophen], Norvasc [amlodipine besylate], Nsaids, Tape, and Zestril [lisinopril]    Review of Systems   Review of Systems  Musculoskeletal:        Left hand pain    Physical Exam Updated Vital Signs BP (!) 162/84   Pulse 98   Temp 98.3 F (36.8 C) (Oral)   Resp 18   Ht '5\' 2"'$  (1.575 m)   Wt 56.7 kg   LMP  (LMP Unknown)   SpO2 98%   BMI 22.86 kg/m  Physical Exam Vitals and nursing note reviewed.  Constitutional:      General: She is not in acute distress.    Appearance: She is well-developed.  HENT:     Head: Normocephalic and atraumatic.  Eyes:     Conjunctiva/sclera: Conjunctivae normal.  Cardiovascular:     Rate and Rhythm: Normal rate and regular rhythm.  Pulmonary:     Effort: Pulmonary effort is normal. No respiratory distress.     Breath sounds: Normal breath sounds.  Abdominal:     Palpations: Abdomen is soft.  Musculoskeletal:        General: Tenderness present. No swelling.       Hands:     Cervical back: Neck supple.     Comments: Numbness as indicated above, with mild bruising.  ROM of digits and wrist appears grossly intact.  Radial pulse 2+ bilaterally.  CRT less than 2.  Without decrease sensation of hand or digits.  No evidence of obvious bony abnormality, malalignment, dislocation.  Skin:     General: Skin is warm and dry.     Capillary Refill: Capillary refill takes less than 2 seconds.  Neurological:     Mental Status: She is alert and oriented to person, place, and time.  Psychiatric:        Mood and Affect: Mood normal.     ED Results / Procedures / Treatments   Labs (all labs ordered are listed, but only abnormal results are displayed) Labs Reviewed - No data to display  EKG None  Radiology DG Hand Complete Left  Result Date: 08/30/2022 CLINICAL DATA:  Fall EXAM: LEFT HAND - COMPLETE 3+ VIEW COMPARISON:  None Available. FINDINGS:  No acute bony abnormality. Specifically, no fracture, subluxation, or dislocation. Early degenerative changes of the 1st carpometacarpal joint. Vascular calcifications. IMPRESSION: No acute bony abnormality. Electronically Signed   By: Rolm Baptise M.D.   On: 08/30/2022 20:01    Procedures Procedures    Medications Ordered in ED Medications - No data to display  ED Course/ Medical Decision Making/ A&P                           Medical Decision Making Amount and/or Complexity of Data Reviewed Radiology: ordered.   71 y.o. female presents to the ED for concern of Hand Pain   This involves an extensive number of treatment options, and is a complaint that carries with it a high risk of complications and morbidity.  The emergent differential diagnosis prior to evaluation includes, but is not limited to: Contusion, fracture, dislocation, sprain  This is not an exhaustive differential.   Past Medical History / Co-morbidities / Social History: Hx of CKD, HTN Social Determinants of Health include: Elderly  Additional History:  Obtained by chart review.  Notably recent ED visit, see for details  Lab Tests: None  Imaging Studies: I ordered imaging studies including XR left hand.   I independently visualized and interpreted imaging which showed no evidence of acute bony abnormality I agree with the radiologist  interpretation.  ED Course: Pt well-appearing on exam.  Presenting to the ED with continued left hand pain.  Left upper extremity appears neurovascularly intact.  No evidence of bony abnormality on exam.  No anatomical snuffbox tenderness.  CRT less than 2.  ROM of fingers and wrist appears grossly intact.  Patient X-Ray negative for obvious fracture or dislocation.  Pain managed in ED.  Pt advised to follow up with orthopedics if symptoms persist for possibility of missed fracture diagnosis.  Brace does not feel appropriate at this time, as would likely place pressure over the area of tenderness.  Conservative therapy recommended and discussed.  With Hx of CKD, recommend use of Tylenol for OTC pain management.  Patient in NAD and in good condition at time of discharge.  Disposition: After consideration the patient's encounter today, I do not feel today's workup suggests an emergent condition requiring admission or immediate intervention beyond what has been performed at this time.  Safe for discharge; instructed to return immediately for worsening symptoms, change in symptoms or any other concerns.  I have reviewed the patients home medicines and have made adjustments as needed.  Discussed course of treatment with the patient, whom demonstrated understanding.  Patient in agreement and has no further questions.     This chart was dictated using voice recognition software.  Despite best efforts to proofread, errors can occur which can change the documentation meaning.         Final Clinical Impression(s) / ED Diagnoses Final diagnoses:  Left hand pain  Fall, subsequent encounter    Rx / DC Orders ED Discharge Orders     None         Candace Cruise 08/24/24 2145    Dorie Rank, MD 08/31/22 1513

## 2022-09-11 ENCOUNTER — Telehealth: Payer: Self-pay

## 2022-09-11 ENCOUNTER — Encounter (HOSPITAL_COMMUNITY)
Admission: RE | Admit: 2022-09-11 | Discharge: 2022-09-11 | Disposition: A | Discharge status: Home / Self Care | Payer: Medicare HMO | Source: Ambulatory Visit | Attending: Nephrology | Admitting: Nephrology

## 2022-09-11 DIAGNOSIS — Z299 Encounter for prophylactic measures, unspecified: Secondary | ICD-10-CM | POA: Diagnosis not present

## 2022-09-11 DIAGNOSIS — R569 Unspecified convulsions: Secondary | ICD-10-CM | POA: Diagnosis not present

## 2022-09-11 DIAGNOSIS — N184 Chronic kidney disease, stage 4 (severe): Secondary | ICD-10-CM

## 2022-09-11 DIAGNOSIS — E1122 Type 2 diabetes mellitus with diabetic chronic kidney disease: Secondary | ICD-10-CM | POA: Diagnosis not present

## 2022-09-11 DIAGNOSIS — R55 Syncope and collapse: Secondary | ICD-10-CM | POA: Diagnosis not present

## 2022-09-11 DIAGNOSIS — I779 Disorder of arteries and arterioles, unspecified: Secondary | ICD-10-CM | POA: Diagnosis not present

## 2022-09-11 DIAGNOSIS — I10 Essential (primary) hypertension: Secondary | ICD-10-CM | POA: Diagnosis not present

## 2022-09-11 LAB — POCT HEMOGLOBIN-HEMACUE: Hemoglobin: 8.8 g/dL — ABNORMAL LOW (ref 12.0–15.0)

## 2022-09-11 MED ORDER — EPOETIN ALFA-EPBX 3000 UNIT/ML IJ SOLN
3000.0000 [IU] | Freq: Once | INTRAMUSCULAR | Status: AC
  Administered 2022-09-11: 3000 [IU] via SUBCUTANEOUS

## 2022-09-11 MED ORDER — EPOETIN ALFA-EPBX 3000 UNIT/ML IJ SOLN
INTRAMUSCULAR | Status: AC
  Filled 2022-09-11: qty 1

## 2022-09-11 NOTE — Telephone Encounter (Signed)
     Patient  visit on 08/30/2022  at [facility] was for pain in left hand.  Have you been able to follow up with your primary care physician? Yes  The patient was or was not able to obtain any needed medicine or equipment. No meds prescribed.  Are there diet recommendations that you are having difficulty following? No  Patient expresses understanding of discharge instructions and education provided has no other needs at this time.    [employer] [facility] Care Guide   [name]  ?? [phone]   Website: [email or web address]  [email or web address]

## 2022-09-27 ENCOUNTER — Encounter (HOSPITAL_COMMUNITY)
Admission: RE | Admit: 2022-09-27 | Discharge: 2022-09-27 | Disposition: A | Discharge status: Home / Self Care | Payer: Medicare HMO | Source: Ambulatory Visit | Attending: Nephrology | Admitting: Nephrology

## 2022-09-30 DIAGNOSIS — I10 Essential (primary) hypertension: Secondary | ICD-10-CM | POA: Diagnosis not present

## 2022-09-30 DIAGNOSIS — Z6823 Body mass index (BMI) 23.0-23.9, adult: Secondary | ICD-10-CM | POA: Diagnosis not present

## 2022-09-30 DIAGNOSIS — E1122 Type 2 diabetes mellitus with diabetic chronic kidney disease: Secondary | ICD-10-CM | POA: Diagnosis not present

## 2022-09-30 DIAGNOSIS — E119 Type 2 diabetes mellitus without complications: Secondary | ICD-10-CM | POA: Diagnosis not present

## 2022-09-30 DIAGNOSIS — Z0130 Encounter for examination of blood pressure without abnormal findings: Secondary | ICD-10-CM | POA: Diagnosis not present

## 2022-09-30 DIAGNOSIS — M549 Dorsalgia, unspecified: Secondary | ICD-10-CM | POA: Diagnosis not present

## 2022-09-30 DIAGNOSIS — Z79899 Other long term (current) drug therapy: Secondary | ICD-10-CM | POA: Diagnosis not present

## 2022-09-30 DIAGNOSIS — R300 Dysuria: Secondary | ICD-10-CM | POA: Diagnosis not present

## 2022-09-30 DIAGNOSIS — N184 Chronic kidney disease, stage 4 (severe): Secondary | ICD-10-CM | POA: Diagnosis not present

## 2022-09-30 DIAGNOSIS — E7800 Pure hypercholesterolemia, unspecified: Secondary | ICD-10-CM | POA: Diagnosis not present

## 2022-10-01 DIAGNOSIS — K859 Acute pancreatitis without necrosis or infection, unspecified: Secondary | ICD-10-CM | POA: Diagnosis not present

## 2022-10-11 DIAGNOSIS — R739 Hyperglycemia, unspecified: Secondary | ICD-10-CM | POA: Diagnosis not present

## 2022-10-15 ENCOUNTER — Encounter (HOSPITAL_COMMUNITY): Payer: Self-pay

## 2022-10-15 ENCOUNTER — Inpatient Hospital Stay (HOSPITAL_COMMUNITY)
Admission: EM | Admit: 2022-10-15 | Discharge: 2022-10-21 | DRG: 291 | Disposition: A | Discharge status: Home Health | Payer: Medicare HMO | Attending: Internal Medicine | Admitting: Internal Medicine

## 2022-10-15 ENCOUNTER — Emergency Department (HOSPITAL_COMMUNITY): Payer: Medicare HMO

## 2022-10-15 DIAGNOSIS — F419 Anxiety disorder, unspecified: Secondary | ICD-10-CM | POA: Diagnosis present

## 2022-10-15 DIAGNOSIS — Z9181 History of falling: Secondary | ICD-10-CM

## 2022-10-15 DIAGNOSIS — W19XXXA Unspecified fall, initial encounter: Secondary | ICD-10-CM | POA: Diagnosis present

## 2022-10-15 DIAGNOSIS — Z794 Long term (current) use of insulin: Secondary | ICD-10-CM | POA: Diagnosis not present

## 2022-10-15 DIAGNOSIS — M25511 Pain in right shoulder: Secondary | ICD-10-CM | POA: Diagnosis present

## 2022-10-15 DIAGNOSIS — E1110 Type 2 diabetes mellitus with ketoacidosis without coma: Secondary | ICD-10-CM | POA: Diagnosis present

## 2022-10-15 DIAGNOSIS — N179 Acute kidney failure, unspecified: Secondary | ICD-10-CM | POA: Diagnosis present

## 2022-10-15 DIAGNOSIS — K5090 Crohn's disease, unspecified, without complications: Secondary | ICD-10-CM | POA: Diagnosis present

## 2022-10-15 DIAGNOSIS — E871 Hypo-osmolality and hyponatremia: Secondary | ICD-10-CM | POA: Diagnosis present

## 2022-10-15 DIAGNOSIS — D631 Anemia in chronic kidney disease: Secondary | ICD-10-CM | POA: Diagnosis present

## 2022-10-15 DIAGNOSIS — I5032 Chronic diastolic (congestive) heart failure: Secondary | ICD-10-CM | POA: Diagnosis present

## 2022-10-15 DIAGNOSIS — E875 Hyperkalemia: Secondary | ICD-10-CM | POA: Diagnosis present

## 2022-10-15 DIAGNOSIS — E860 Dehydration: Secondary | ICD-10-CM | POA: Diagnosis present

## 2022-10-15 DIAGNOSIS — S42301A Unspecified fracture of shaft of humerus, right arm, initial encounter for closed fracture: Secondary | ICD-10-CM | POA: Diagnosis present

## 2022-10-15 DIAGNOSIS — E1140 Type 2 diabetes mellitus with diabetic neuropathy, unspecified: Secondary | ICD-10-CM | POA: Diagnosis present

## 2022-10-15 DIAGNOSIS — E782 Mixed hyperlipidemia: Secondary | ICD-10-CM | POA: Diagnosis present

## 2022-10-15 DIAGNOSIS — F32A Depression, unspecified: Secondary | ICD-10-CM | POA: Diagnosis present

## 2022-10-15 DIAGNOSIS — Z8781 Personal history of (healed) traumatic fracture: Secondary | ICD-10-CM

## 2022-10-15 DIAGNOSIS — E1122 Type 2 diabetes mellitus with diabetic chronic kidney disease: Secondary | ICD-10-CM | POA: Diagnosis present

## 2022-10-15 DIAGNOSIS — Z7984 Long term (current) use of oral hypoglycemic drugs: Secondary | ICD-10-CM

## 2022-10-15 DIAGNOSIS — R296 Repeated falls: Secondary | ICD-10-CM | POA: Diagnosis present

## 2022-10-15 DIAGNOSIS — Z86011 Personal history of benign neoplasm of the brain: Secondary | ICD-10-CM

## 2022-10-15 DIAGNOSIS — G255 Other chorea: Secondary | ICD-10-CM | POA: Diagnosis not present

## 2022-10-15 DIAGNOSIS — Z833 Family history of diabetes mellitus: Secondary | ICD-10-CM

## 2022-10-15 DIAGNOSIS — Z888 Allergy status to other drugs, medicaments and biological substances status: Secondary | ICD-10-CM

## 2022-10-15 DIAGNOSIS — I10 Essential (primary) hypertension: Secondary | ICD-10-CM | POA: Diagnosis not present

## 2022-10-15 DIAGNOSIS — Z886 Allergy status to analgesic agent status: Secondary | ICD-10-CM

## 2022-10-15 DIAGNOSIS — S0990XA Unspecified injury of head, initial encounter: Secondary | ICD-10-CM | POA: Diagnosis not present

## 2022-10-15 DIAGNOSIS — I6523 Occlusion and stenosis of bilateral carotid arteries: Secondary | ICD-10-CM | POA: Diagnosis not present

## 2022-10-15 DIAGNOSIS — M25552 Pain in left hip: Secondary | ICD-10-CM | POA: Diagnosis present

## 2022-10-15 DIAGNOSIS — R9431 Abnormal electrocardiogram [ECG] [EKG]: Secondary | ICD-10-CM | POA: Diagnosis not present

## 2022-10-15 DIAGNOSIS — Z79899 Other long term (current) drug therapy: Secondary | ICD-10-CM

## 2022-10-15 DIAGNOSIS — N184 Chronic kidney disease, stage 4 (severe): Secondary | ICD-10-CM | POA: Diagnosis present

## 2022-10-15 DIAGNOSIS — I130 Hypertensive heart and chronic kidney disease with heart failure and stage 1 through stage 4 chronic kidney disease, or unspecified chronic kidney disease: Principal | ICD-10-CM | POA: Diagnosis present

## 2022-10-15 DIAGNOSIS — S199XXA Unspecified injury of neck, initial encounter: Secondary | ICD-10-CM | POA: Diagnosis not present

## 2022-10-15 DIAGNOSIS — Z91048 Other nonmedicinal substance allergy status: Secondary | ICD-10-CM

## 2022-10-15 DIAGNOSIS — I2510 Atherosclerotic heart disease of native coronary artery without angina pectoris: Secondary | ICD-10-CM | POA: Diagnosis present

## 2022-10-15 DIAGNOSIS — Z87442 Personal history of urinary calculi: Secondary | ICD-10-CM

## 2022-10-15 DIAGNOSIS — Z96641 Presence of right artificial hip joint: Secondary | ICD-10-CM | POA: Diagnosis present

## 2022-10-15 DIAGNOSIS — Z881 Allergy status to other antibiotic agents status: Secondary | ICD-10-CM

## 2022-10-15 DIAGNOSIS — E1165 Type 2 diabetes mellitus with hyperglycemia: Secondary | ICD-10-CM | POA: Diagnosis not present

## 2022-10-15 DIAGNOSIS — S82002A Unspecified fracture of left patella, initial encounter for closed fracture: Secondary | ICD-10-CM | POA: Diagnosis present

## 2022-10-15 DIAGNOSIS — M4322 Fusion of spine, cervical region: Secondary | ICD-10-CM | POA: Diagnosis not present

## 2022-10-15 DIAGNOSIS — Z8673 Personal history of transient ischemic attack (TIA), and cerebral infarction without residual deficits: Secondary | ICD-10-CM

## 2022-10-15 DIAGNOSIS — Z800 Family history of malignant neoplasm of digestive organs: Secondary | ICD-10-CM

## 2022-10-15 DIAGNOSIS — M545 Low back pain, unspecified: Secondary | ICD-10-CM | POA: Diagnosis present

## 2022-10-15 DIAGNOSIS — Z882 Allergy status to sulfonamides status: Secondary | ICD-10-CM

## 2022-10-15 DIAGNOSIS — S42291A Other displaced fracture of upper end of right humerus, initial encounter for closed fracture: Secondary | ICD-10-CM | POA: Diagnosis not present

## 2022-10-15 DIAGNOSIS — Z90710 Acquired absence of both cervix and uterus: Secondary | ICD-10-CM

## 2022-10-15 DIAGNOSIS — S42351A Displaced comminuted fracture of shaft of humerus, right arm, initial encounter for closed fracture: Secondary | ICD-10-CM | POA: Diagnosis not present

## 2022-10-15 LAB — CBC WITH DIFFERENTIAL/PLATELET
Abs Immature Granulocytes: 0.15 10*3/uL — ABNORMAL HIGH (ref 0.00–0.07)
Basophils Absolute: 0.1 10*3/uL (ref 0.0–0.1)
Basophils Relative: 1 %
Eosinophils Absolute: 0.1 10*3/uL (ref 0.0–0.5)
Eosinophils Relative: 1 %
HCT: 30.3 % — ABNORMAL LOW (ref 36.0–46.0)
Hemoglobin: 10 g/dL — ABNORMAL LOW (ref 12.0–15.0)
Immature Granulocytes: 1 %
Lymphocytes Relative: 3 %
Lymphs Abs: 0.6 10*3/uL — ABNORMAL LOW (ref 0.7–4.0)
MCH: 30.6 pg (ref 26.0–34.0)
MCHC: 33 g/dL (ref 30.0–36.0)
MCV: 92.7 fL (ref 80.0–100.0)
Monocytes Absolute: 1.3 10*3/uL — ABNORMAL HIGH (ref 0.1–1.0)
Monocytes Relative: 6 %
Neutro Abs: 17.7 10*3/uL — ABNORMAL HIGH (ref 1.7–7.7)
Neutrophils Relative %: 88 %
Platelets: 296 10*3/uL (ref 150–400)
RBC: 3.27 MIL/uL — ABNORMAL LOW (ref 3.87–5.11)
RDW: 13.7 % (ref 11.5–15.5)
WBC: 19.9 10*3/uL — ABNORMAL HIGH (ref 4.0–10.5)
nRBC: 0 % (ref 0.0–0.2)

## 2022-10-15 LAB — BASIC METABOLIC PANEL
Anion gap: 12 (ref 5–15)
Anion gap: 16 — ABNORMAL HIGH (ref 5–15)
BUN: 77 mg/dL — ABNORMAL HIGH (ref 8–23)
BUN: 77 mg/dL — ABNORMAL HIGH (ref 8–23)
CO2: 18 mmol/L — ABNORMAL LOW (ref 22–32)
CO2: 13 mmol/L — ABNORMAL LOW (ref 22–32)
Calcium: 9.6 mg/dL (ref 8.9–10.3)
Calcium: 9.3 mg/dL (ref 8.9–10.3)
Chloride: 94 mmol/L — ABNORMAL LOW (ref 98–111)
Chloride: 96 mmol/L — ABNORMAL LOW (ref 98–111)
Creatinine, Ser: 3.27 mg/dL — ABNORMAL HIGH (ref 0.44–1.00)
Creatinine, Ser: 3.37 mg/dL — ABNORMAL HIGH (ref 0.44–1.00)
GFR, Estimated: 15 mL/min — ABNORMAL LOW (ref >60)
GFR, Estimated: 14 mL/min — ABNORMAL LOW (ref >60)
Glucose, Bld: 480 mg/dL — ABNORMAL HIGH (ref 70–99)
Glucose, Bld: 434 mg/dL — ABNORMAL HIGH (ref 70–99)
Potassium: 6.1 mmol/L — ABNORMAL HIGH (ref 3.5–5.1)
Potassium: 6.3 mmol/L (ref 3.5–5.1)
Sodium: 124 mmol/L — ABNORMAL LOW (ref 135–145)
Sodium: 125 mmol/L — ABNORMAL LOW (ref 135–145)

## 2022-10-15 LAB — CBG MONITORING, ED
Glucose-Capillary: 437 mg/dL — ABNORMAL HIGH (ref 70–99)
Glucose-Capillary: 355 mg/dL — ABNORMAL HIGH (ref 70–99)
Glucose-Capillary: 257 mg/dL — ABNORMAL HIGH (ref 70–99)

## 2022-10-15 LAB — MAGNESIUM: Magnesium: 2.1 mg/dL (ref 1.7–2.4)

## 2022-10-15 LAB — TSH: TSH: 2.284 u[IU]/mL (ref 0.350–4.500)

## 2022-10-15 MED ORDER — INSULIN ASPART 100 UNIT/ML IJ SOLN
0.0000 [IU] | Freq: Every day | INTRAMUSCULAR | Status: DC
  Administered 2022-10-16: 3 [IU] via SUBCUTANEOUS

## 2022-10-15 MED ORDER — INSULIN ASPART 100 UNIT/ML IV SOLN
10.0000 [IU] | Freq: Once | INTRAVENOUS | Status: AC
  Administered 2022-10-15: 10 [IU] via INTRAVENOUS

## 2022-10-15 MED ORDER — DICYCLOMINE HCL 20 MG PO TABS
20.0000 mg | ORAL_TABLET | Freq: Two times a day (BID) | ORAL | Status: DC
  Administered 2022-10-16: 20 mg via ORAL
  Filled 2022-10-15 (×4): qty 1

## 2022-10-15 MED ORDER — INSULIN ASPART 100 UNIT/ML IJ SOLN: 0.0000 [IU] | Freq: Three times a day (TID) | INTRAMUSCULAR | Status: DC

## 2022-10-15 MED ORDER — INSULIN DETEMIR 100 UNIT/ML ~~LOC~~ SOLN
20.0000 [IU] | Freq: Every day | SUBCUTANEOUS | Status: DC
  Administered 2022-10-16: 20 [IU] via SUBCUTANEOUS
  Filled 2022-10-15 (×2): qty 0.2

## 2022-10-15 MED ORDER — PANTOPRAZOLE SODIUM 40 MG PO TBEC
40.0000 mg | DELAYED_RELEASE_TABLET | Freq: Every day | ORAL | Status: DC
  Administered 2022-10-15 – 2022-10-21 (×7): 40 mg via ORAL
  Filled 2022-10-15 (×7): qty 1

## 2022-10-15 MED ORDER — OXYCODONE-ACETAMINOPHEN 5-325 MG PO TABS
1.0000 | ORAL_TABLET | Freq: Once | ORAL | Status: AC
  Administered 2022-10-15: 1 via ORAL
  Filled 2022-10-15: qty 1

## 2022-10-15 MED ORDER — ATORVASTATIN CALCIUM 40 MG PO TABS
80.0000 mg | ORAL_TABLET | Freq: Every day | ORAL | Status: DC
  Administered 2022-10-15 – 2022-10-21 (×7): 80 mg via ORAL
  Filled 2022-10-15 (×7): qty 2

## 2022-10-15 MED ORDER — TIZANIDINE HCL 4 MG PO TABS
4.0000 mg | ORAL_TABLET | Freq: Two times a day (BID) | ORAL | Status: DC
  Administered 2022-10-15 – 2022-10-19 (×8): 4 mg via ORAL
  Filled 2022-10-15 (×8): qty 1

## 2022-10-15 MED ORDER — DEXTROSE 50 % IV SOLN
1.0000 | Freq: Once | INTRAVENOUS | Status: AC
  Administered 2022-10-15: 50 mL via INTRAVENOUS
  Filled 2022-10-15: qty 50

## 2022-10-15 MED ORDER — GABAPENTIN 300 MG PO CAPS
300.0000 mg | ORAL_CAPSULE | Freq: Every day | ORAL | Status: DC
  Administered 2022-10-15 – 2022-10-18 (×4): 300 mg via ORAL
  Filled 2022-10-15 (×4): qty 1

## 2022-10-15 MED ORDER — TEMAZEPAM 15 MG PO CAPS: 15.0000 mg | ORAL_CAPSULE | Freq: Every evening | ORAL | Status: DC | PRN

## 2022-10-15 MED ORDER — SODIUM ZIRCONIUM CYCLOSILICATE 5 G PO PACK
10.0000 g | PACK | Freq: Once | ORAL | Status: AC
  Administered 2022-10-15: 10 g via ORAL
  Filled 2022-10-15: qty 2

## 2022-10-15 MED ORDER — LEVETIRACETAM 500 MG PO TABS
500.0000 mg | ORAL_TABLET | Freq: Two times a day (BID) | ORAL | Status: DC
  Administered 2022-10-15 – 2022-10-21 (×12): 500 mg via ORAL
  Filled 2022-10-15 (×12): qty 1

## 2022-10-15 MED ORDER — SODIUM CHLORIDE 0.9 % IV BOLUS
1000.0000 mL | Freq: Once | INTRAVENOUS | Status: AC
  Administered 2022-10-15: 1000 mL via INTRAVENOUS

## 2022-10-15 MED ORDER — HYDRALAZINE HCL 25 MG PO TABS
50.0000 mg | ORAL_TABLET | Freq: Two times a day (BID) | ORAL | Status: DC
  Administered 2022-10-15: 50 mg via ORAL
  Filled 2022-10-15: qty 2

## 2022-10-15 MED ORDER — VENLAFAXINE HCL ER 75 MG PO CP24
150.0000 mg | ORAL_CAPSULE | Freq: Every day | ORAL | Status: DC
  Administered 2022-10-15 – 2022-10-21 (×7): 150 mg via ORAL
  Filled 2022-10-15: qty 2
  Filled 2022-10-15: qty 4
  Filled 2022-10-15: qty 2
  Filled 2022-10-15: qty 4
  Filled 2022-10-15 (×3): qty 2

## 2022-10-15 MED ORDER — SODIUM CHLORIDE 0.9 % IV SOLN: INTRAVENOUS | Status: DC

## 2022-10-15 MED ORDER — INSULIN ASPART 100 UNIT/ML IV SOLN
6.0000 [IU] | Freq: Once | INTRAVENOUS | Status: AC
  Administered 2022-10-15: 6 [IU] via INTRAVENOUS

## 2022-10-15 MED ORDER — HEPARIN SODIUM (PORCINE) 5000 UNIT/ML IJ SOLN
5000.0000 [IU] | Freq: Three times a day (TID) | INTRAMUSCULAR | Status: DC
  Administered 2022-10-15 – 2022-10-21 (×17): 5000 [IU] via SUBCUTANEOUS
  Filled 2022-10-15 (×17): qty 1

## 2022-10-15 MED ORDER — ZIPRASIDONE HCL 20 MG PO CAPS
20.0000 mg | ORAL_CAPSULE | Freq: Two times a day (BID) | ORAL | Status: DC
  Administered 2022-10-15 – 2022-10-21 (×12): 20 mg via ORAL
  Filled 2022-10-15 (×15): qty 1

## 2022-10-15 MED ORDER — ACETAMINOPHEN 325 MG PO TABS
650.0000 mg | ORAL_TABLET | ORAL | Status: DC
  Administered 2022-10-15 – 2022-10-16 (×2): 650 mg via ORAL
  Filled 2022-10-15 (×2): qty 2

## 2022-10-15 MED ORDER — METOPROLOL SUCCINATE ER 50 MG PO TB24
100.0000 mg | ORAL_TABLET | Freq: Every day | ORAL | Status: DC
  Administered 2022-10-15: 100 mg via ORAL
  Filled 2022-10-15: qty 2

## 2022-10-15 NOTE — H&P (Signed)
History and Physical    Heather [name]:[id number] DOB: Jul 18, 1951 DOA: 10/15/2022  PCP: [name], MD  Patient coming from: Home  I have personally briefly reviewed patient's old medical records in [place]  Chief Complaint: Frequent falls  HPI: Heather [name] is a 71 y.o. female with medical history significant of uncontrolled type 2 diabetes mellitus with hyperglycemia, CKD stage IV, anxiety, hypertension and many other comorbidities presented to ED with a complaint of frequent falls.  According to patient, she initially fell 2 days ago in the bathroom, hit her head to the side of the table.  She did not lose consciousness.  No history of prodromal symptoms such as dizziness.  Per ED physician, the patient came to the ED because she has been hurting in the head since then and shoulder pain on the right side has also gotten worse so she came to the ED however when I asked her the reason to come to the ED now and not 2 days ago, she told me that she has been falling and has fallen 2 more times since then but she has not hit her head since then.  Upon further questioning, she tells me that the falls are usually secondary to generalized weakness, her legs are bending up.  No other complaint.  Denies any fever, chills, sweating, nausea, vomiting, any problem with urination or bowel movement or any shortness of breath.  ED Course: Upon arrival to ED, blood pressure was fairly stable, she was hemodynamically stable however she was found to have AKI on CKD stage IV as well as hyperkalemia, hyperglycemia and hyponatremia.  She was given insulin and repeat potassium went up from 6.1-6.3.  She has now orders of Lokelma.  She has received 1 L of IV fluid bolus.  Hospital service were consulted for further management and admission.  Review of Systems: As per HPI otherwise negative.    Past Medical History:  Diagnosis Date   Acute renal failure superimposed on stage 3 chronic kidney  disease ([place]) 01/21/2016   Anemia    Anxiety    B12 deficiency    Brain tumor (benign) ([place]) 10/29/2020   Cellulitis and abscess    Abdomen and buttocks   Chronic diastolic CHF (congestive heart failure) ([place]) 03/16/2022   CKD (chronic kidney disease) stage 3, GFR 30-59 ml/min (HCC)    Closed fracture of distal end of femur, unspecified fracture morphology, initial encounter ([place])    Coronary arteriosclerosis 07/15/2018   Crohn disease ([place])    Depression    Diabetic neuropathy (HCC)    feet   DJD (degenerative joint disease)    Right forminal stenosis C4-5   Essential hypertension    Femur fracture, right ([place]) 48/54/6270   Folliculitis    GERD (gastroesophageal reflux disease)    Gout    Headache(784.0)    Hiatal hernia    History of blood transfusion    History of bronchitis    History of cardiac catheterization    No significant CAD 2012   History of kidney stones    surgery to remove   Insomnia    Irritable bowel syndrome    Mixed hyperlipidemia    Osteoarthritis    Palpitations    Pneumonia    Reflux esophagitis    Salmonella    Stroke [facility])    Tinnitus    Right   Type 2 diabetes mellitus ([place])     Past Surgical History:  Procedure Laterality Date  BACK SURGERY     c-spine surgery     03/2007   CARDIAC CATHETERIZATION     CHOLECYSTECTOMY     COLONOSCOPY  04/21/2011; 05/29/11   6/12: [place] - ?AVM at IC valve, inflammatory changes at IC valve [facility]); 7/12 - [place]; IC valve erosions, look chronic and probably Crohn's per path   colonoscopy  2017   [place]: random biopsies normal. TI normal   ESOPHAGOGASTRODUODENOSCOPY  05/29/11   normal   ESOPHAGOGASTRODUODENOSCOPY N/A 01/21/2016   Dr. Michail Sermon: normal   EYE SURGERY Bilateral    cataracts removed   FEMUR IM NAIL Right 04/10/2018   Procedure: INTRAMEDULLARY (IM) NAIL FEMORAL;  Surgeon: Paralee Cancel, MD;  Location: [facility];  Service: Orthopedics;  Laterality: Right;   FRACTURE SURGERY      GIVENS CAPSULE STUDY  11/17/2020   HARDWARE REMOVAL Right 04/10/2018   Procedure: HARDWARE REMOVAL RIGHT DISTAL FEMUR;  Surgeon: Paralee Cancel, MD;  Location: [facility];  Service: Orthopedics;  Laterality: Right;   HERNIA REPAIR     IR FLUORO GUIDE CV LINE RIGHT  04/01/2017   IR US GUIDE VASC ACCESS RIGHT  04/01/2017   JOINT REPLACEMENT Right    hip   OPEN REDUCTION INTERNAL FIXATION (ORIF) DISTAL RADIAL FRACTURE Right 05/28/2019   Procedure: OPEN REDUCTION INTERNAL FIXATION (ORIF) DISTAL RADIAL FRACTURE;  Surgeon: Verner Mould, MD;  Location: [place];  Service: Orthopedics;  Laterality: Right;  with block 90 minutes   ORIF FEMUR FRACTURE Right 03/31/2017   Procedure: OPEN REDUCTION INTERNAL FIXATION (ORIF) DISTAL FEMUR FRACTURE;  Surgeon: Paralee Cancel, MD;  Location: [place];  Service: Orthopedics;  Laterality: Right;   removal of kidney stone     Right knee arthroscopic surgery     TONSILLECTOMY     TOTAL ABDOMINAL HYSTERECTOMY       reports that she has never smoked. She has never used smokeless tobacco. She reports that she does not drink alcohol and does not use drugs.  Allergies  Allergen Reactions   Cozaar [Losartan] Nausea And Vomiting, Swelling and Rash   Quinolones Itching, Nausea And Vomiting, Nausea Only and Swelling    Swelling of face, jaw, and lips    Cymbalta [Duloxetine Hcl] Itching and Swelling   Sulfa Antibiotics Nausea And Vomiting and Swelling    Headache  Swelling of feet, legs    Synalar [Fluocinolone] Swelling   Cipro [Ciprofloxacin Hcl] Nausea And Vomiting and Swelling    Swelling of face, jaw, lips   Diovan [Valsartan] Nausea And Vomiting and Swelling   Glucophage [Metformin] Other (See Comments)    GI upset   Ketalar [Ketamine] Swelling   Norco [Hydrocodone-Acetaminophen] Itching and Swelling   Norvasc [Amlodipine Besylate] Itching and Swelling    Fluid retention    Nsaids Nausea And Vomiting and Other (See Comments)    Has bleeding ulcers   Tape  Itching and Rash    Paper tape can only be used on this patient   Zestril [Lisinopril] Swelling and Rash    Oral swelling Red streaks on arms/legs    Family History  Problem Relation Age of Onset   Diabetes Mother    Colon cancer Brother        POSSIBLY. Patient states diagnosed at age 64, then later states age 49. unclear and limited historian   Esophageal cancer Neg Hx    Rectal cancer Neg Hx    Stomach cancer Neg Hx     Prior to Admission medications  Medication Sig Start Date End Date Taking? Authorizing Provider  acetaminophen (TYLENOL) 325 MG tablet Take 2 tablets (650 mg total) by mouth every 4 (four) hours. 12/01/21   Johnson, Clanford L, MD  allopurinol (ZYLOPRIM) 100 MG tablet Take 100 mg by mouth daily. 03/20/21   [provider]  Ascorbic Acid (VITAMIN C WITH ROSE HIPS) 500 MG tablet Take 500 mg by mouth every morning.    [provider]  atorvastatin (LIPITOR) 80 MG tablet Take 1 tablet (80 mg total) by mouth daily. Restart on 06/29/22 06/22/22   Orson Eva, MD  busPIRone (BUSPAR) 10 MG tablet Take 10 mg by mouth 3 (three) times daily as needed for anxiety. 03/04/22   [provider]  calcitRIOL (ROCALTROL) 0.25 MCG capsule Take 0.25 mcg by mouth 3 (three) times a week. Monday,Wed and Fri. 10/05/21   [provider]  Cholecalciferol (VITAMIN D) 2000 units CAPS Take 2,000 Units by mouth every morning.     [provider]  cyanocobalamin (,VITAMIN B-12,) 1000 MCG/ML injection Inject 1,000 mcg into the muscle every 30 (thirty) days.      [provider]  dicyclomine (BENTYL) 20 MG tablet Take 1 tablet (20 mg total) by mouth in the morning and at bedtime. 12/01/21   Johnson, Clanford L, MD  diphenhydrAMINE (BENADRYL) 2 % cream Apply topically 3 (three) times daily as needed for itching. 12/01/21   Johnson, Clanford L, MD  esomeprazole (NEXIUM) 40 MG capsule Take 40 mg by mouth daily before breakfast.      [provider]   FARXIGA 10 MG TABS tablet Take 10 mg by mouth daily. 07/11/22   [provider]  furosemide (LASIX) 40 MG tablet Take 1 tablet (40 mg total) by mouth daily. 06/22/22   Orson Eva, MD  gabapentin (NEURONTIN) 300 MG capsule Take 1 capsule (300 mg total) by mouth at bedtime. 05/27/22   Emokpae, [name], MD  glipiZIDE (GLUCOTROL) 5 MG tablet Take 5 mg by mouth daily. 07/18/22   [provider]  hydrALAZINE (APRESOLINE) 50 MG tablet Take 1 tablet (50 mg total) by mouth 2 (two) times daily. 06/22/22   Orson Eva, MD  insulin aspart (NOVOLOG) 100 UNIT/ML FlexPen Inject 5-11 Units into the skin 3 (three) times daily with meals. 07/09/22   Brita Romp, NP  insulin detemir (LEVEMIR FLEXPEN) 100 UNIT/ML FlexPen Inject 20 Units into the skin at bedtime. 07/10/22   Brita Romp, NP  levETIRAcetam (KEPPRA) 500 MG tablet Take 500 mg by mouth 2 (two) times daily.    [provider]  metoprolol succinate (TOPROL XL) 100 MG 24 hr tablet Take 1 tablet (100 mg total) by mouth daily. Take with or immediately following a meal. 05/27/22 05/27/23  Roxan Hockey, MD  oxyCODONE-acetaminophen (PERCOCET/ROXICET) 5-325 MG tablet Take 1 pill every 6 hours for pain has not relieved by Tylenol or Motrin alone 08/20/22   Milton Ferguson, MD  potassium chloride SA (KLOR-CON M) 20 MEQ tablet Take 20 mEq by mouth 3 (three) times a week. 07/11/22   [provider]  spironolactone (ALDACTONE) 25 MG tablet Take 25 mg by mouth daily. 07/18/22   [provider]  temazepam (RESTORIL) 15 MG capsule Take 15 mg by mouth at bedtime as needed. 06/10/22   [provider]  tiZANidine (ZANAFLEX) 4 MG tablet Take 4 mg by mouth 2 (two) times daily. 06/14/22   [provider]  valsartan (DIOVAN) 80 MG tablet Take 80 mg by mouth daily. 05/17/22  [provider]  venlafaxine XR (EFFEXOR-XR) 150 MG 24 hr capsule Take 150 mg by mouth daily. 07/11/22   [provider]   Vitamin D, Ergocalciferol, (DRISDOL) 1.25 MG (50000 UNIT) CAPS capsule Take 50,000 Units by mouth once a week. 07/09/22   [provider]  ziprasidone (GEODON) 20 MG capsule Take 20 mg by mouth 2 (two) times daily. 06/09/22   [provider]    Physical Exam: Vitals:   10/15/22 1449 10/15/22 1451 10/15/22 1757 10/15/22 1900  BP:  (!) 143/60 (!) 150/49 (!) 161/51  Pulse: 71 72 69 70  Resp: 17  14   Temp: 97.9 F (36.6 C)  98.2 F (36.8 C)   TempSrc: Oral  Oral   SpO2: 99%  100% 97%  Weight:      Height:        Constitutional: NAD, calm, comfortable Vitals:   10/15/22 1449 10/15/22 1451 10/15/22 1757 10/15/22 1900  BP:  (!) 143/60 (!) 150/49 (!) 161/51  Pulse: 71 72 69 70  Resp: 17  14   Temp: 97.9 F (36.6 C)  98.2 F (36.8 C)   TempSrc: Oral  Oral   SpO2: 99%  100% 97%  Weight:      Height:       Eyes: PERRL, lids and conjunctivae normal ENMT: Mucous membranes are moist. Posterior pharynx clear of any exudate or lesions.Normal dentition.  Neck: normal, supple, no masses, no thyromegaly Respiratory: clear to auscultation bilaterally, no wheezing, no crackles. Normal respiratory effort. No accessory muscle use.  Cardiovascular: Regular rate and rhythm, no murmurs / rubs / gallops. No extremity edema. 2+ pedal pulses. No carotid bruits.  Abdomen: no tenderness, no masses palpated. No hepatosplenomegaly. Bowel sounds positive.  Musculoskeletal: no clubbing / cyanosis. No joint deformity upper and lower extremities. Good ROM, no contractures. Normal muscle tone.  Skin: no rashes, lesions, ulcers. No induration Neurologic: CN 2-12 grossly intact. Sensation intact, DTR normal. Strength 5/5 in all 4.  Psychiatric: Normal judgment and insight. Alert and oriented x 3. Normal mood.    Labs on Admission: I have personally reviewed following labs and imaging studies  CBC: Recent Labs  Lab 10/15/22 1609  WBC 19.9*  NEUTROABS 17.7*  HGB 10.0*  HCT 30.3*  MCV  92.7  PLT 283   Basic Metabolic Panel: Recent Labs  Lab 10/15/22 1609 10/15/22 1944  NA 124* 125*  K 6.1* 6.3*  CL 94* 96*  CO2 18* 13*  GLUCOSE 480* 434*  BUN 77* 77*  CREATININE 3.27* 3.37*  CALCIUM 9.6 9.3   GFR: Estimated Creatinine Clearance: 13.3 mL/min (A) (by C-G formula based on SCr of 3.37 mg/dL (H)). Liver Function Tests: No results for input(s): "AST", "ALT", "ALKPHOS", "BILITOT", "PROT", "ALBUMIN" in the last 168 hours. No results for input(s): "LIPASE", "AMYLASE" in the last 168 hours. No results for input(s): "AMMONIA" in the last 168 hours. Coagulation Profile: No results for input(s): "INR", "PROTIME" in the last 168 hours. Cardiac Enzymes: No results for input(s): "CKTOTAL", "CKMB", "CKMBINDEX", "TROPONINI" in the last 168 hours. BNP (last 3 results) No results for input(s): "PROBNP" in the last 8760 hours. HbA1C: No results for input(s): "HGBA1C" in the last 72 hours. CBG: Recent Labs  Lab 10/15/22 1912 10/15/22 2038  GLUCAP 437* 355*   Lipid Profile: No results for input(s): "CHOL", "HDL", "LDLCALC", "TRIG", "CHOLHDL", "LDLDIRECT" in the last 72 hours. Thyroid Function Tests: No results for input(s): "TSH", "T4TOTAL", "FREET4", "T3FREE", "THYROIDAB" in the last 72 hours.  Anemia Panel: No results for input(s): "VITAMINB12", "FOLATE", "FERRITIN", "TIBC", "IRON", "RETICCTPCT" in the last 72 hours. Urine analysis:    Component Value Date/Time   COLORURINE YELLOW 07/04/2022 1700   APPEARANCEUR CLEAR 07/04/2022 1700   LABSPEC 1.014 07/04/2022 1700   PHURINE 6.0 07/04/2022 1700   GLUCOSEU >=500 (A) 07/04/2022 1700   GLUCOSEU NEGATIVE 03/28/2018 1224   HGBUR NEGATIVE 07/04/2022 1700   BILIRUBINUR NEGATIVE 07/04/2022 1700   KETONESUR NEGATIVE 07/04/2022 1700   PROTEINUR 100 (A) 07/04/2022 1700   UROBILINOGEN 1.0 03/28/2018 1224   NITRITE NEGATIVE 07/04/2022 1700   LEUKOCYTESUR NEGATIVE 07/04/2022 1700    Radiological Exams on Admission: CT  Head Wo Contrast  Result Date: 10/15/2022 CLINICAL DATA:  Head trauma EXAM: CT HEAD WITHOUT CONTRAST TECHNIQUE: Contiguous axial images were obtained from the base of the skull through the vertex without intravenous contrast. RADIATION DOSE REDUCTION: This exam was performed according to the departmental dose-optimization program which includes automated exposure control, adjustment of the mA and/or kV according to patient size and/or use of iterative reconstruction technique. COMPARISON:  CT head 08/22/2022.  MRI brain 08/23/2022. FINDINGS: Brain: No evidence of acute infarction, hemorrhage, hydrocephalus, extra-axial collection or mass lesion/mass effect. There is mild increased asymmetric hyperdensity in the left lentiform. There is no surrounding edema or mass effect. This is a new finding. This is area not convincing for acute hemorrhage. Vascular: Atherosclerotic calcifications are present within the cavernous internal carotid arteries. Skull: Normal. Negative for fracture or focal lesion. Sinuses/Orbits: No acute finding. Other: There is soft tissue swelling overlying the posterior right parietal region. IMPRESSION: 1. No acute intracranial hemorrhage. 2. Mild increased asymmetric hyperdensity in the left lentiform. Findings are indeterminate, but can be seen with nonketotic hyperglycemic hemichorea. 3. Soft tissue swelling overlying the posterior right parietal region. Electronically Signed   By: Ronney Asters M.D.   On: 10/15/2022 17:40   CT Cervical Spine Wo Contrast  Result Date: 10/15/2022 CLINICAL DATA:  Trauma EXAM: CT CERVICAL SPINE WITHOUT CONTRAST TECHNIQUE: Multidetector CT imaging of the cervical spine was performed without intravenous contrast. Multiplanar CT image reconstructions were also generated. RADIATION DOSE REDUCTION: This exam was performed according to the departmental dose-optimization program which includes automated exposure control, adjustment of the mA and/or kV according  to patient size and/or use of iterative reconstruction technique. COMPARISON:  08/20/2022 FINDINGS: Alignment: Postoperative straightening of the normal cervical lordosis. Skull base and vertebrae: No acute fracture. No primary bone lesion or focal pathologic process. Soft tissues and spinal canal: No prevertebral fluid or swelling. No visible canal hematoma. Disc levels: Extensive multilevel discectomy and fusion from C3 through T1. Upper chest: Negative. Other: None. IMPRESSION: 1. No fracture or static subluxation of the cervical spine. 2. Extensive multilevel discectomy and fusion from C3 through T1. Electronically Signed   By: Delanna Ahmadi M.D.   On: 10/15/2022 17:31   DG Shoulder Right  Result Date: 10/15/2022 CLINICAL DATA:  Pelvis morning.  Possible history of fracture. EXAM: RIGHT SHOULDER - 2+ VIEW; RIGHT HUMERUS - 2+ VIEW COMPARISON:  Right shoulder radiographs 03/28/2022, 07/24/2022; FINDINGS: There is diffuse decreased bone mineralization. Redemonstration of comminuted, impacted now subacute to chronic fracture of the proximal right humerus including the inferior aspect of the humeral head and the surgical neck. There is high-grade (approximately 2.7 cm craniocaudal dimension) impaction of the humeral shaft into the inferior aspect of the humeral head. High-grade bone loss of the inferior aspect of the humeral head. Approximately 1.4 cm anterior displacement of the  humeral shaft on lateral view, unchanged. Moderate glenohumeral joint space narrowing. Old unchanged bone fragments overlying the inferior glenohumeral joint space. Remote healed fracture of the mid to lateral right clavicular shaft with associated clavicle shortening, unchanged. No acute fracture is seen.  No dislocation. Partial visualization of lower cervical spine anterior and posterior fusion hardware. IMPRESSION: Redemonstration of comminuted, impacted now subacute to chronic fracture of the proximal right humerus including the  inferior aspect of the humeral head and the surgical neck. There is high-grade impaction of the humeral shaft into the inferior aspect of the humeral head. No significant change from 07/24/2022. Electronically Signed   By: Yvonne Kendall M.D.   On: 10/15/2022 17:11   DG Humerus Right  Result Date: 10/15/2022 CLINICAL DATA:  Pelvis morning.  Possible history of fracture. EXAM: RIGHT SHOULDER - 2+ VIEW; RIGHT HUMERUS - 2+ VIEW COMPARISON:  Right shoulder radiographs 03/28/2022, 07/24/2022; FINDINGS: There is diffuse decreased bone mineralization. Redemonstration of comminuted, impacted now subacute to chronic fracture of the proximal right humerus including the inferior aspect of the humeral head and the surgical neck. There is high-grade (approximately 2.7 cm craniocaudal dimension) impaction of the humeral shaft into the inferior aspect of the humeral head. High-grade bone loss of the inferior aspect of the humeral head. Approximately 1.4 cm anterior displacement of the humeral shaft on lateral view, unchanged. Moderate glenohumeral joint space narrowing. Old unchanged bone fragments overlying the inferior glenohumeral joint space. Remote healed fracture of the mid to lateral right clavicular shaft with associated clavicle shortening, unchanged. No acute fracture is seen.  No dislocation. Partial visualization of lower cervical spine anterior and posterior fusion hardware. IMPRESSION: Redemonstration of comminuted, impacted now subacute to chronic fracture of the proximal right humerus including the inferior aspect of the humeral head and the surgical neck. There is high-grade impaction of the humeral shaft into the inferior aspect of the humeral head. No significant change from 07/24/2022. Electronically Signed   By: Yvonne Kendall M.D.   On: 10/15/2022 17:11    EKG: Independently reviewed.  Normal sinus rhythm with no acute ST-T wave changes.  Assessment/Plan Active Problems:   Acute renal failure  superimposed on stage 4 chronic kidney disease ([place])   Uncontrolled type 2 diabetes mellitus with hyperglycemia, with long-term current use of insulin (HCC)   Essential hypertension   Hyperkalemia   Anxiety   History of right shoulder fracture   Frequent falls/generalized weakness: Likely mechanical falls due to generalized weakness.  CT head, cervical spine unremarkable.  Patient is now complaining of left hip pain.  I will obtain left hip x-ray.  Patient claims that she fell in the bathroom in the ED but no one is able to verify that.  Uncontrolled type 2 diabetes mellitus with hyperkalemia: She takes glipizide as well as 20 units of Lantus at home, hold glipizide, resume 20 units of Lantus and start on SSI.  Check hemoglobin A1c.  Dehydration/AKI on CKD stage IV, patient's baseline hemoglobin appears to be around 2.3-2.5, upon admission, creatinine is 3.37.  She appears dry clinically.  Will start on IV fluids and repeat labs in the morning.   Chronic diastolic congestive heart failure: Patient is on Aldactone, Farxiga and Lasix.  Due to AKI, I will hold all of them and reassess tomorrow morning.  Hyperkalemia: Potassium 6.3.  She has received Lokelma.  Monitor on telemetry and repeat labs in the morning.  Acute on chronic hyponatremia: She appears to have intermittent hyponatremia in the past.  This time this seems to be likely due to dehydration.  Start with normal saline and recheck in the morning.  Essential hypertension: Controlled.  Resume hydralazine and Toprol-XL.  Hold while daughter.  Anxiety: Resume home medications.  DVT prophylaxis: heparin injection 5,000 Units Start: 10/15/22 2200 Code Status: Full code Family Communication: None present at bedside.  Plan of care discussed with patient in length and he verbalized understanding and agreed with it. Disposition Plan: To be determined. Consults called: None  Darliss Cheney MD [facility]  *Please note that this is a  verbal dictation therefore any spelling or grammatical errors are due to the "[place] [employer]" system interpretation.  Please page via [place] and do not message via secure chat for urgent patient care matters. Secure chat can be used for non urgent patient care matters. 10/15/2022, 10:05 PM  To contact the attending provider between 7A-7P or the covering provider during after hours 7P-7A, please log into the web site [email or web address]

## 2022-10-15 NOTE — ED Provider Notes (Addendum)
[facility] EMERGENCY DEPARTMENT Provider Note   CSN: [id number] Arrival date & time: 10/15/22  1434     History  Chief Complaint  Patient presents with   [name]    Heather [name] is a 71 y.o. female.   Fall Associated symptoms include headaches. Pertinent negatives include no chest pain, no abdominal pain and no shortness of breath.       Heather [name] is a 71 y.o. female with past medical tree of uncontrolled type 2 diabetes, CKD, hypertension, anemia, and CHF who presents to the Emergency Department complaining of fall 2 days ago hitting the back of her head.  She states she was in the bathroom and struck the corner of a table.  She has been having headaches since her fall.  She denies loss of consciousness.  She also complains of worsening right shoulder pain.  She has hx of fracture/dislocation of her right shoulder in September.  She has been unable to have surgery to repair this due to her persistent hyperglycemia.  She denies any nausea vomiting, dizziness, visual changes.  She endorses frequent falls.  No chest pain or shortness of breath.  Home Medications Prior to Admission medications   Medication Sig Start Date End Date Taking? Authorizing Provider  acetaminophen (TYLENOL) 325 MG tablet Take 2 tablets (650 mg total) by mouth every 4 (four) hours. 12/01/21   Johnson, Clanford L, MD  allopurinol (ZYLOPRIM) 100 MG tablet Take 100 mg by mouth daily. 03/20/21   [provider]  Ascorbic Acid (VITAMIN C WITH ROSE HIPS) 500 MG tablet Take 500 mg by mouth every morning.    [provider]  atorvastatin (LIPITOR) 80 MG tablet Take 1 tablet (80 mg total) by mouth daily. Restart on 06/29/22 06/22/22   Orson Eva, MD  busPIRone (BUSPAR) 10 MG tablet Take 10 mg by mouth 3 (three) times daily as needed for anxiety. 03/04/22   [provider]  calcitRIOL (ROCALTROL) 0.25 MCG capsule Take 0.25 mcg by mouth 3 (three) times a week. Monday,Wed and Fri. 10/05/21    [provider]  Cholecalciferol (VITAMIN D) 2000 units CAPS Take 2,000 Units by mouth every morning.     [provider]  cyanocobalamin (,VITAMIN B-12,) 1000 MCG/ML injection Inject 1,000 mcg into the muscle every 30 (thirty) days.      [provider]  dicyclomine (BENTYL) 20 MG tablet Take 1 tablet (20 mg total) by mouth in the morning and at bedtime. 12/01/21   Johnson, Clanford L, MD  diphenhydrAMINE (BENADRYL) 2 % cream Apply topically 3 (three) times daily as needed for itching. 12/01/21   Johnson, Clanford L, MD  esomeprazole (NEXIUM) 40 MG capsule Take 40 mg by mouth daily before breakfast.      [provider]  FARXIGA 10 MG TABS tablet Take 10 mg by mouth daily. 07/11/22   [provider]  furosemide (LASIX) 40 MG tablet Take 1 tablet (40 mg total) by mouth daily. 06/22/22   Orson Eva, MD  gabapentin (NEURONTIN) 300 MG capsule Take 1 capsule (300 mg total) by mouth at bedtime. 05/27/22   Emokpae, [name], MD  glipiZIDE (GLUCOTROL) 5 MG tablet Take 5 mg by mouth daily. 07/18/22   [provider]  hydrALAZINE (APRESOLINE) 50 MG tablet Take 1 tablet (50 mg total) by mouth 2 (two) times daily. 06/22/22   Orson Eva, MD  insulin aspart (NOVOLOG) 100 UNIT/ML FlexPen Inject 5-11 Units into the skin 3 (three) times daily with meals. 07/09/22  Brita Romp, NP  insulin detemir (LEVEMIR FLEXPEN) 100 UNIT/ML FlexPen Inject 20 Units into the skin at bedtime. 07/10/22   Brita Romp, NP  levETIRAcetam (KEPPRA) 500 MG tablet Take 500 mg by mouth 2 (two) times daily.    [provider]  metoprolol succinate (TOPROL XL) 100 MG 24 hr tablet Take 1 tablet (100 mg total) by mouth daily. Take with or immediately following a meal. 05/27/22 05/27/23  Roxan Hockey, MD  oxyCODONE-acetaminophen (PERCOCET/ROXICET) 5-325 MG tablet Take 1 pill every 6 hours for pain has not relieved by Tylenol or Motrin alone 08/20/22   Milton Ferguson, MD   potassium chloride SA (KLOR-CON M) 20 MEQ tablet Take 20 mEq by mouth 3 (three) times a week. 07/11/22   [provider]  spironolactone (ALDACTONE) 25 MG tablet Take 25 mg by mouth daily. 07/18/22   [provider]  temazepam (RESTORIL) 15 MG capsule Take 15 mg by mouth at bedtime as needed. 06/10/22   [provider]  tiZANidine (ZANAFLEX) 4 MG tablet Take 4 mg by mouth 2 (two) times daily. 06/14/22   [provider]  valsartan (DIOVAN) 80 MG tablet Take 80 mg by mouth daily. 05/17/22   [provider]  venlafaxine XR (EFFEXOR-XR) 150 MG 24 hr capsule Take 150 mg by mouth daily. 07/11/22   [provider]  Vitamin D, Ergocalciferol, (DRISDOL) 1.25 MG (50000 UNIT) CAPS capsule Take 50,000 Units by mouth once a week. 07/09/22   [provider]  ziprasidone (GEODON) 20 MG capsule Take 20 mg by mouth 2 (two) times daily. 06/09/22   [provider]      Allergies    Cozaar [losartan], Quinolones, Cymbalta [duloxetine hcl], Sulfa antibiotics, Synalar [fluocinolone], Cipro [ciprofloxacin hcl], Diovan [valsartan], Glucophage [metformin], Ketalar [ketamine], Norco [hydrocodone-acetaminophen], Norvasc [amlodipine besylate], Nsaids, Tape, and Zestril [lisinopril]    Review of Systems   Review of Systems  Constitutional:  Negative for fever.  Eyes:  Negative for visual disturbance.  Respiratory:  Negative for shortness of breath.   Cardiovascular:  Negative for chest pain.  Gastrointestinal:  Negative for abdominal pain, nausea and vomiting.  Genitourinary:  Negative for dysuria.  Musculoskeletal:  Positive for neck pain.  Neurological:  Positive for headaches. Negative for dizziness, syncope, weakness and numbness.  Psychiatric/Behavioral:  Negative for confusion.     Physical Exam Updated Vital Signs BP (!) 150/49 (BP Location: Left Arm)   Pulse 69   Temp 98.2 F (36.8 C) (Oral)   Resp 14   Ht '5\' 4"'$  (1.626 m)   Wt 54.4 kg    LMP  (LMP Unknown)   SpO2 100%   BMI 20.60 kg/m  Physical Exam Vitals and nursing note reviewed.  Constitutional:      General: She is not in acute distress.    Appearance: Normal appearance.  HENT:     Head:     Comments: Palpable hematoma to to the occipital scalp.  No abrasion or laceration.    Right Ear: Tympanic membrane and ear canal normal.     Left Ear: Tympanic membrane and ear canal normal.  Eyes:     Extraocular Movements: Extraocular movements intact.     Conjunctiva/sclera: Conjunctivae normal.  Cardiovascular:     Rate and Rhythm: Normal rate and regular rhythm.     Pulses: Normal pulses.  Pulmonary:     Effort: Pulmonary effort is normal.     Breath sounds: Normal breath sounds.  Chest:     Chest  wall: No tenderness.  Musculoskeletal:        General: Tenderness and signs of injury present.  Skin:    General: Skin is warm.     Capillary Refill: Capillary refill takes less than 2 seconds.  Neurological:     General: No focal deficit present.     Mental Status: She is alert.     Sensory: No sensory deficit.     Motor: No weakness.     ED Results / Procedures / Treatments   Labs (all labs ordered are listed, but only abnormal results are displayed) Labs Reviewed  CBC WITH DIFFERENTIAL/PLATELET - Abnormal; Notable for the following components:      Result Value   WBC 19.9 (*)    RBC 3.27 (*)    Hemoglobin 10.0 (*)    HCT 30.3 (*)    Neutro Abs 17.7 (*)    Lymphs Abs 0.6 (*)    Monocytes Absolute 1.3 (*)    Abs Immature Granulocytes 0.15 (*)    All other components within normal limits  BASIC METABOLIC PANEL - Abnormal; Notable for the following components:   Sodium 124 (*)    Potassium 6.1 (*)    Chloride 94 (*)    CO2 18 (*)    Glucose, Bld 480 (*)    BUN 77 (*)    Creatinine, Ser 3.27 (*)    GFR, Estimated 15 (*)    All other components within normal limits    EKG EKG Interpretation  Date/Time:  Monday October 15 2022 15:31:14  EST Ventricular Rate:  71 PR Interval:  176 QRS Duration: 92 QT Interval:  384 QTC Calculation: 417 R Axis:   23 Text Interpretation: Normal sinus rhythm ST & T wave abnormality, consider lateral ischemia Abnormal ECG When compared with ECG of 04-Jul-2022 13:32, No significant change was found Confirmed by Aletta Edouard [id number] on 10/15/2022 3:44:24 PM  Radiology CT Head Wo Contrast  Result Date: 10/15/2022 CLINICAL DATA:  Head trauma EXAM: CT HEAD WITHOUT CONTRAST TECHNIQUE: Contiguous axial images were obtained from the base of the skull through the vertex without intravenous contrast. RADIATION DOSE REDUCTION: This exam was performed according to the departmental dose-optimization program which includes automated exposure control, adjustment of the mA and/or kV according to patient size and/or use of iterative reconstruction technique. COMPARISON:  CT head 08/22/2022.  MRI brain 08/23/2022. FINDINGS: Brain: No evidence of acute infarction, hemorrhage, hydrocephalus, extra-axial collection or mass lesion/mass effect. There is mild increased asymmetric hyperdensity in the left lentiform. There is no surrounding edema or mass effect. This is a new finding. This is area not convincing for acute hemorrhage. Vascular: Atherosclerotic calcifications are present within the cavernous internal carotid arteries. Skull: Normal. Negative for fracture or focal lesion. Sinuses/Orbits: No acute finding. Other: There is soft tissue swelling overlying the posterior right parietal region. IMPRESSION: 1. No acute intracranial hemorrhage. 2. Mild increased asymmetric hyperdensity in the left lentiform. Findings are indeterminate, but can be seen with nonketotic hyperglycemic hemichorea. 3. Soft tissue swelling overlying the posterior right parietal region. Electronically Signed   By: Ronney Asters M.D.   On: 10/15/2022 17:40   CT Cervical Spine Wo Contrast  Result Date: 10/15/2022 CLINICAL DATA:  Trauma EXAM: CT  CERVICAL SPINE WITHOUT CONTRAST TECHNIQUE: Multidetector CT imaging of the cervical spine was performed without intravenous contrast. Multiplanar CT image reconstructions were also generated. RADIATION DOSE REDUCTION: This exam was performed according to the departmental dose-optimization program which includes automated exposure control, adjustment of  the mA and/or kV according to patient size and/or use of iterative reconstruction technique. COMPARISON:  08/20/2022 FINDINGS: Alignment: Postoperative straightening of the normal cervical lordosis. Skull base and vertebrae: No acute fracture. No primary bone lesion or focal pathologic process. Soft tissues and spinal canal: No prevertebral fluid or swelling. No visible canal hematoma. Disc levels: Extensive multilevel discectomy and fusion from C3 through T1. Upper chest: Negative. Other: None. IMPRESSION: 1. No fracture or static subluxation of the cervical spine. 2. Extensive multilevel discectomy and fusion from C3 through T1. Electronically Signed   By: Delanna Ahmadi M.D.   On: 10/15/2022 17:31   DG Shoulder Right  Result Date: 10/15/2022 CLINICAL DATA:  Pelvis morning.  Possible history of fracture. EXAM: RIGHT SHOULDER - 2+ VIEW; RIGHT HUMERUS - 2+ VIEW COMPARISON:  Right shoulder radiographs 03/28/2022, 07/24/2022; FINDINGS: There is diffuse decreased bone mineralization. Redemonstration of comminuted, impacted now subacute to chronic fracture of the proximal right humerus including the inferior aspect of the humeral head and the surgical neck. There is high-grade (approximately 2.7 cm craniocaudal dimension) impaction of the humeral shaft into the inferior aspect of the humeral head. High-grade bone loss of the inferior aspect of the humeral head. Approximately 1.4 cm anterior displacement of the humeral shaft on lateral view, unchanged. Moderate glenohumeral joint space narrowing. Old unchanged bone fragments overlying the inferior glenohumeral joint  space. Remote healed fracture of the mid to lateral right clavicular shaft with associated clavicle shortening, unchanged. No acute fracture is seen.  No dislocation. Partial visualization of lower cervical spine anterior and posterior fusion hardware. IMPRESSION: Redemonstration of comminuted, impacted now subacute to chronic fracture of the proximal right humerus including the inferior aspect of the humeral head and the surgical neck. There is high-grade impaction of the humeral shaft into the inferior aspect of the humeral head. No significant change from 07/24/2022. Electronically Signed   By: Yvonne Kendall M.D.   On: 10/15/2022 17:11   DG Humerus Right  Result Date: 10/15/2022 CLINICAL DATA:  Pelvis morning.  Possible history of fracture. EXAM: RIGHT SHOULDER - 2+ VIEW; RIGHT HUMERUS - 2+ VIEW COMPARISON:  Right shoulder radiographs 03/28/2022, 07/24/2022; FINDINGS: There is diffuse decreased bone mineralization. Redemonstration of comminuted, impacted now subacute to chronic fracture of the proximal right humerus including the inferior aspect of the humeral head and the surgical neck. There is high-grade (approximately 2.7 cm craniocaudal dimension) impaction of the humeral shaft into the inferior aspect of the humeral head. High-grade bone loss of the inferior aspect of the humeral head. Approximately 1.4 cm anterior displacement of the humeral shaft on lateral view, unchanged. Moderate glenohumeral joint space narrowing. Old unchanged bone fragments overlying the inferior glenohumeral joint space. Remote healed fracture of the mid to lateral right clavicular shaft with associated clavicle shortening, unchanged. No acute fracture is seen.  No dislocation. Partial visualization of lower cervical spine anterior and posterior fusion hardware. IMPRESSION: Redemonstration of comminuted, impacted now subacute to chronic fracture of the proximal right humerus including the inferior aspect of the humeral head  and the surgical neck. There is high-grade impaction of the humeral shaft into the inferior aspect of the humeral head. No significant change from 07/24/2022. Electronically Signed   By: Yvonne Kendall M.D.   On: 10/15/2022 17:11    Procedures .Critical Care E&M  Performed by: Kem Parkinson, PA-C Critical care provider statement:    Critical care time (minutes):  30   Critical care was necessary to treat or prevent imminent or  life-threatening deterioration of the following conditions:  Metabolic crisis   Critical care was time spent personally by me on the following activities:  Ordering and performing treatments and interventions, ordering and review of laboratory studies and re-evaluation of patient's condition After initial E/M assessment, critical care services were subsequently performed that were exclusive of separately billable procedures or treatment.         Medications Ordered in ED Medications - No data to display  ED Course/ Medical Decision Making/ A&P                           Medical Decision Making Patient here for evaluation of fall that occurred 2 days ago.  She states that she fell in the bathroom, cannot recall specific reason for the fall believes that she tripped.  Struck her head on a table per family member at bedside.  Complains of headaches.  No vomiting.  Also complains of pain of her right lower neck and right shoulder.  History of previous fracture/dislocation of the shoulder.  Has been followed by orthopedics for this surgery has been postponed due to patient's persistent hyperglycemia.  She endorses increased shoulder pain since the fall from 2 days ago  On my exam, patient alert, mentating well.  No focal neurodeficits on exam.  She does have a palpable hematoma to the occipital scalp.  No bony deformities or abrasion.  No laceration noted.  She has a mild step-off deformity noted of the right shoulder and pain with range of motion of the right arm.   Neurovascularly intact.  Differential would include but not limited to postconcussive syndrome, subdural hematoma, subarachnoid hemorrhage, skull fracture.  Amount and/or Complexity of Data Reviewed Labs: ordered.    Details: Labs interpreted by me, significant leukocytosis of nearly 20,000, hemoglobin 10 this is improved from 1 month ago.  Chemistries Show potassium of 6.1, blood sugar 480, BUN 77, creatinine 3.27.  It this is a 1 point increased from 2 months ago. Question of hemolyzed sample, will order repeat BMP Radiology: ordered.    Details: X-ray of the right humerus and right shoulder show redemonstrated comminuted impacted subacute to chronic fracture of the proximal right humerus.  No significant change from 07/24/2022.  CT of the head shows no acute intracranial process, there is asymmetric hyperdensity of the left lentiform can be seen with nonketotic hyperglycemia hemichorea.  No acute fracture or subluxation of the C-spine ECG/medicine tests: ordered.    Details: EKG shows normal sinus rhythm ST and T wave changes without significant change from previous EKG of September Discussion of management or test interpretation with external provider(s): Repeat BMP to ensure initial chemistry was not hemolyzed, Patient given IV fluids, insulin, repeat BMP shows increasing potassium now 6.3.  Blood sugar after insulin 355, she also has likely new AKI.  Lokelma also ordered.  Cause of her leukocytosis unclear, no fever, no clinical indication of infectious process.  Felt to be related to recent trauma.    She will need hospital admission for AKI and hyperkalemia  Discussed findings with [facility] hospitalist, Dr. Doristine Bosworth who is agreeable to admit.  Risk OTC drugs. Prescription drug management. Decision regarding hospitalization.           Final Clinical Impression(s) / ED Diagnoses Final diagnoses:  Hyperkalemia  AKI (acute kidney injury) ([place])    Rx / DC Orders ED Discharge  Orders     None  Kem Parkinson, PA-C 10/15/22 2200    Kem Parkinson, PA-C 10/15/22 2203    Hayden Rasmussen, MD 10/16/22 1025

## 2022-10-15 NOTE — ED Triage Notes (Signed)
Pt fell in the bathroom two days ago hitting the back of her head on the corner of a table. Pt also c/o R shoulder pain. Denies LOC. No blood thinners per pt.

## 2022-10-16 ENCOUNTER — Other Ambulatory Visit: Payer: Self-pay

## 2022-10-16 DIAGNOSIS — E875 Hyperkalemia: Secondary | ICD-10-CM

## 2022-10-16 DIAGNOSIS — E1110 Type 2 diabetes mellitus with ketoacidosis without coma: Secondary | ICD-10-CM | POA: Diagnosis not present

## 2022-10-16 DIAGNOSIS — N184 Chronic kidney disease, stage 4 (severe): Secondary | ICD-10-CM | POA: Diagnosis not present

## 2022-10-16 DIAGNOSIS — Z794 Long term (current) use of insulin: Secondary | ICD-10-CM

## 2022-10-16 DIAGNOSIS — N179 Acute kidney failure, unspecified: Secondary | ICD-10-CM | POA: Diagnosis not present

## 2022-10-16 DIAGNOSIS — E1165 Type 2 diabetes mellitus with hyperglycemia: Secondary | ICD-10-CM | POA: Diagnosis not present

## 2022-10-16 LAB — URINALYSIS, COMPLETE (UACMP) WITH MICROSCOPIC
Bilirubin Urine: NEGATIVE
Glucose, UA: >=500 mg/dL — AB
Hgb urine dipstick: NEGATIVE
Ketones, ur: NEGATIVE mg/dL
Nitrite: NEGATIVE
Protein, ur: >=300 mg/dL — AB
Specific Gravity, Urine: 1.011 (ref 1.005–1.030)
pH: 5 (ref 5.0–8.0)

## 2022-10-16 LAB — CBC
HCT: 24.9 % — ABNORMAL LOW (ref 36.0–46.0)
Hemoglobin: 8 g/dL — ABNORMAL LOW (ref 12.0–15.0)
MCH: 30.4 pg (ref 26.0–34.0)
MCHC: 32.1 g/dL (ref 30.0–36.0)
MCV: 94.7 fL (ref 80.0–100.0)
Platelets: 184 10*3/uL (ref 150–400)
RBC: 2.63 MIL/uL — ABNORMAL LOW (ref 3.87–5.11)
RDW: 13.8 % (ref 11.5–15.5)
WBC: 16 10*3/uL — ABNORMAL HIGH (ref 4.0–10.5)
nRBC: 0 % (ref 0.0–0.2)

## 2022-10-16 LAB — COMPREHENSIVE METABOLIC PANEL
ALT: 13 U/L (ref 0–44)
AST: 16 U/L (ref 15–41)
Albumin: 2.6 g/dL — ABNORMAL LOW (ref 3.5–5.0)
Alkaline Phosphatase: 84 U/L (ref 38–126)
Anion gap: 9 (ref 5–15)
BUN: 77 mg/dL — ABNORMAL HIGH (ref 8–23)
CO2: 19 mmol/L — ABNORMAL LOW (ref 22–32)
Calcium: 9 mg/dL (ref 8.9–10.3)
Chloride: 102 mmol/L (ref 98–111)
Creatinine, Ser: 3.17 mg/dL — ABNORMAL HIGH (ref 0.44–1.00)
GFR, Estimated: 15 mL/min — ABNORMAL LOW (ref >60)
Glucose, Bld: 88 mg/dL (ref 70–99)
Potassium: 5 mmol/L (ref 3.5–5.1)
Sodium: 130 mmol/L — ABNORMAL LOW (ref 135–145)
Total Bilirubin: 0.5 mg/dL (ref 0.3–1.2)
Total Protein: 5.8 g/dL — ABNORMAL LOW (ref 6.5–8.1)

## 2022-10-16 LAB — CBG MONITORING, ED
Glucose-Capillary: 100 mg/dL — ABNORMAL HIGH (ref 70–99)
Glucose-Capillary: 121 mg/dL — ABNORMAL HIGH (ref 70–99)
Glucose-Capillary: 216 mg/dL — ABNORMAL HIGH (ref 70–99)
Glucose-Capillary: 261 mg/dL — ABNORMAL HIGH (ref 70–99)
Glucose-Capillary: 44 mg/dL — CL (ref 70–99)
Glucose-Capillary: 59 mg/dL — ABNORMAL LOW (ref 70–99)

## 2022-10-16 LAB — GLUCOSE, CAPILLARY: Glucose-Capillary: 171 mg/dL — ABNORMAL HIGH (ref 70–99)

## 2022-10-16 MED ORDER — HYDROMORPHONE HCL 1 MG/ML IJ SOLN
0.5000 mg | INTRAMUSCULAR | Status: DC | PRN
  Administered 2022-10-17 – 2022-10-19 (×4): 0.5 mg via INTRAVENOUS
  Filled 2022-10-16 (×4): qty 0.5

## 2022-10-16 MED ORDER — INSULIN ASPART 100 UNIT/ML IJ SOLN
0.0000 [IU] | Freq: Three times a day (TID) | INTRAMUSCULAR | Status: DC
  Administered 2022-10-16: 1 [IU] via SUBCUTANEOUS
  Administered 2022-10-16: 3 [IU] via SUBCUTANEOUS
  Administered 2022-10-18: 5 [IU] via SUBCUTANEOUS
  Administered 2022-10-19 (×2): 2 [IU] via SUBCUTANEOUS
  Administered 2022-10-20 (×2): 3 [IU] via SUBCUTANEOUS
  Administered 2022-10-21: 2 [IU] via SUBCUTANEOUS
  Filled 2022-10-16 (×2): qty 1

## 2022-10-16 MED ORDER — ACETAMINOPHEN 500 MG PO TABS
1000.0000 mg | ORAL_TABLET | Freq: Four times a day (QID) | ORAL | Status: DC
  Administered 2022-10-16 – 2022-10-21 (×16): 1000 mg via ORAL
  Filled 2022-10-16 (×20): qty 2

## 2022-10-16 MED ORDER — METOPROLOL SUCCINATE ER 25 MG PO TB24
25.0000 mg | ORAL_TABLET | Freq: Every day | ORAL | Status: DC
  Administered 2022-10-16 – 2022-10-21 (×6): 25 mg via ORAL
  Filled 2022-10-16 (×6): qty 1

## 2022-10-16 MED ORDER — INSULIN DETEMIR 100 UNIT/ML ~~LOC~~ SOLN
10.0000 [IU] | Freq: Every day | SUBCUTANEOUS | Status: DC
  Administered 2022-10-16 – 2022-10-18 (×3): 10 [IU] via SUBCUTANEOUS
  Filled 2022-10-16 (×3): qty 0.1

## 2022-10-16 MED ORDER — INSULIN ASPART 100 UNIT/ML IJ SOLN: 0.0000 [IU] | Freq: Every day | INTRAMUSCULAR | Status: DC

## 2022-10-16 MED ORDER — ONDANSETRON HCL 4 MG/2ML IJ SOLN: 4.0000 mg | Freq: Four times a day (QID) | INTRAMUSCULAR | Status: DC | PRN

## 2022-10-16 MED ORDER — DICYCLOMINE HCL 10 MG PO CAPS
20.0000 mg | ORAL_CAPSULE | Freq: Two times a day (BID) | ORAL | Status: DC
  Administered 2022-10-16 – 2022-10-19 (×7): 20 mg via ORAL
  Filled 2022-10-16 (×8): qty 2

## 2022-10-16 NOTE — ED Notes (Signed)
Pt given graham crackers and juice

## 2022-10-16 NOTE — ED Notes (Signed)
Pt aware of need for urine sample.  

## 2022-10-16 NOTE — Progress Notes (Signed)
PROGRESS NOTE  Heather [name] LDJ:[id number] DOB: 07-05-1951 DOA: 10/15/2022 PCP: [name], MD  Brief History:  71 y/o female with history of diabetes mellitus type 2, hyperlipidemia, dCHF, Crohn's disease, CKD stage 4, B12 deficiency, anxiety disorder, stroke, HTN, chronic pancreatitis, mixed hyperlipidemia presenting with another fall.  She denies syncope.  Apparently, the patient fell 2 days prior to admission in the bathroom and hit the side of her head.  She she has been complaining of pain in her head and right shoulder since then.  The patient has had a history of frequent falls in the past 12 months.  She has chronic right leg weakness which seems to have started in late January 2023 after a fall.  She has been complaining of low back pain secondary to her falls.  She denies any syncope.  She was most recently admitted from 07/04/2022 to 07/07/2022 with acute on chronic pancreatitis, acute on chronic renal failure, and falls.  In addition, she had another hospitalization from 06/20/2022 to 06/22/2022 with acute on chronic pancreatitis, acute on chronic renal failure, and falls.  She has had a known right humerus fracture for which she is following [name].  She endorses dietary indiscretion, but states that she has been taking her insulin. In the ED, the patient was afebrile and hemodynamically stable.  WBC 16.0, hemoglobin 8.0, platelets 204,000.  Sodium 125, potassium 6.3, bicarbonate 13, serum creatinine 3.37, serum glucose of 434.  Assessment/Plan: DKA type II -The patient presented with serum glucose up to 480 with anion gap 16 -Improved with IV fluids and subcutaneous insulin -Patient endorses dietary indiscretion -06/20/2022 hemoglobin A1c 10.0 -Repeat hemoglobin A1c -Continue IV fluids  Acute on chronic renal failure--CKD stage IV -Baseline creatinine 2.3-2.6 -Presented with serum creatinine 3.37 -Secondary to volume depletion and hemodynamic changes -Continue  IV fluids  Hyperkalemia -Patient was given temporizing measures and Lokelma -Improving -Continue IV fluids  Chronic diastolic CHF -Clinically euvolemic -lower dose metoprolol succinate temporarily secondary to soft blood pressure  Mixed hyperlipidemia -Continue statin  Essential hypertension -Holding hydralazine secondary to soft blood pressures -start metoprolol succinate at lower dose  Crohn's disease -No active symptoms presently -Outpatient GI follow-up  Uncontrolled diabetes mellitus type 2 with hyperglycemia -Repeat hemoglobin A1c -NovoLog sliding scale -Patient has had brittle diabetes with episodes of hypoglycemia -Decrease Levemir to 10 units  Anxiety/depression -Continue Effexor, Geodon  Right Humerus fracture -Xray in ED shows no change when compared to 07/24/22      Family Communication:  no Family at bedside  Consultants:  none  Code Status:  FULL  DVT Prophylaxis:  [place] Heparin    Procedures: As Listed in Progress Note Above  Antibiotics: None  RN Pressure Injury Documentation: Pressure Injury 08/26/21 Buttocks Right;Left Stage 1 -  Intact skin with non-blanchable redness of a localized area usually over a bony prominence. (Active)  08/26/21 0050  Location: Buttocks  Location Orientation: Right;Left  Staging: Stage 1 -  Intact skin with non-blanchable redness of a localized area usually over a bony prominence.  Wound Description (Comments):   Present on Admission: Yes     Pressure Injury 03/16/22 Buttocks Left;Medial Stage 2 -  Partial thickness loss of dermis presenting as a shallow open injury with a red, pink wound bed without slough. pink wound bed, perimeter reddened and purple (Active)  03/16/22 0015  Location: Buttocks  Location Orientation: Left;Medial  Staging: Stage 2 -  Partial thickness loss of dermis  presenting as a shallow open injury with a red, pink wound bed without slough.  Wound Description (Comments): pink wound bed,  perimeter reddened and purple  Present on Admission: Yes     Pressure Injury 06/21/22 Coccyx Right Stage 1 -  Intact skin with non-blanchable redness of a localized area usually over a bony prominence. red circular 0.5cm pressure injury directly on boney sacrum (Active)  06/21/22 1000  Location: Coccyx  Location Orientation: Right  Staging: Stage 1 -  Intact skin with non-blanchable redness of a localized area usually over a bony prominence.  Wound Description (Comments): red circular 0.5cm pressure injury directly on boney sacrum  Present on Admission:         Subjective: She complains of right arm pain and low back pain.  Denies any fevers, chills, chest pain, shortness breath, cough, hemoptysis, nausea, vomiting, diarrhea, abdominal pain.  Objective: Vitals:   10/16/22 0530 10/16/22 0600 10/16/22 0800 10/16/22 0800  BP: (!) 107/50 (!) 127/59 (!) 155/62   Pulse: 60  78   Resp: 17  18   Temp:    97.8 F (36.6 C)  TempSrc:    Oral  SpO2: 93%  96%   Weight:      Height:       No intake or output data in the 24 hours ending 10/16/22 0851 Weight change:  Exam:  General:  Pt is alert, follows commands appropriately, not in acute distress HEENT: No icterus, No thrush, No neck mass, [place]/AT Cardiovascular: RRR, S1/S2, no rubs, no gallops Respiratory: Bibasilar crackles.  No wheezing.  Good air movement Abdomen: Soft/+BS, non tender, non distended, no guarding Extremities: No edema, No lymphangitis, No petechiae, No rashes, no synovitis       Data Reviewed: I have personally reviewed following labs and imaging studies Basic Metabolic Panel: Recent Labs  Lab 10/15/22 1609 10/15/22 1944 10/16/22 0502  NA 124* 125* 130*  K 6.1* 6.3* 5.0  CL 94* 96* 102  CO2 18* 13* 19*  GLUCOSE 480* 434* 88  BUN 77* 77* 77*  CREATININE 3.27* 3.37* 3.17*  CALCIUM 9.6 9.3 9.0  MG  --  2.1  --    Liver Function Tests: Recent Labs  Lab 10/16/22 0502  AST 16  ALT 13  ALKPHOS 84   BILITOT 0.5  PROT 5.8*  ALBUMIN 2.6*   No results for input(s): "LIPASE", "AMYLASE" in the last 168 hours. No results for input(s): "AMMONIA" in the last 168 hours. Coagulation Profile: No results for input(s): "INR", "PROTIME" in the last 168 hours. CBC: Recent Labs  Lab 10/15/22 1609 10/16/22 0502  WBC 19.9* 16.0*  NEUTROABS 17.7*  --   HGB 10.0* 8.0*  HCT 30.3* 24.9*  MCV 92.7 94.7  PLT 296 184   Cardiac Enzymes: No results for input(s): "CKTOTAL", "CKMB", "CKMBINDEX", "TROPONINI" in the last 168 hours. BNP: Invalid input(s): "POCBNP" CBG: Recent Labs  Lab 10/15/22 2248 10/16/22 0034 10/16/22 0746 10/16/22 0806 10/16/22 0844  GLUCAP 257* 261* 44* 59* 100*   HbA1C: No results for input(s): "HGBA1C" in the last 72 hours. Urine analysis:    Component Value Date/Time   COLORURINE YELLOW 07/04/2022 1700   APPEARANCEUR CLEAR 07/04/2022 1700   LABSPEC 1.014 07/04/2022 1700   PHURINE 6.0 07/04/2022 1700   GLUCOSEU >=500 (A) 07/04/2022 1700   GLUCOSEU NEGATIVE 03/28/2018 1224   HGBUR NEGATIVE 07/04/2022 1700   BILIRUBINUR NEGATIVE 07/04/2022 1700   KETONESUR NEGATIVE 07/04/2022 1700   PROTEINUR 100 (A) 07/04/2022 1700  UROBILINOGEN 1.0 03/28/2018 1224   NITRITE NEGATIVE 07/04/2022 1700   LEUKOCYTESUR NEGATIVE 07/04/2022 1700   Sepsis Labs: '@LABRCNTIP'$ (procalcitonin:4,lacticidven:4) )No results found for this or any previous visit (from the past 240 hour(s)).   Scheduled Meds:  acetaminophen  650 mg Oral Q4H   atorvastatin  80 mg Oral Daily   dicyclomine  20 mg Oral BID   gabapentin  300 mg Oral QHS   heparin  5,000 Units Subcutaneous Q8H   insulin aspart  0-15 Units Subcutaneous TID WC   insulin aspart  0-5 Units Subcutaneous QHS   insulin detemir  10 Units Subcutaneous QHS   levETIRAcetam  500 mg Oral BID   metoprolol succinate  25 mg Oral Daily   pantoprazole  40 mg Oral Daily   tiZANidine  4 mg Oral BID   venlafaxine XR  150 mg Oral Daily    ziprasidone  20 mg Oral BID   Continuous Infusions:  sodium chloride 100 mL/hr at 10/15/22 2353    Procedures/Studies: CT Head Wo Contrast  Result Date: 10/15/2022 CLINICAL DATA:  Head trauma EXAM: CT HEAD WITHOUT CONTRAST TECHNIQUE: Contiguous axial images were obtained from the base of the skull through the vertex without intravenous contrast. RADIATION DOSE REDUCTION: This exam was performed according to the departmental dose-optimization program which includes automated exposure control, adjustment of the mA and/or kV according to patient size and/or use of iterative reconstruction technique. COMPARISON:  CT head 08/22/2022.  MRI brain 08/23/2022. FINDINGS: Brain: No evidence of acute infarction, hemorrhage, hydrocephalus, extra-axial collection or mass lesion/mass effect. There is mild increased asymmetric hyperdensity in the left lentiform. There is no surrounding edema or mass effect. This is a new finding. This is area not convincing for acute hemorrhage. Vascular: Atherosclerotic calcifications are present within the cavernous internal carotid arteries. Skull: Normal. Negative for fracture or focal lesion. Sinuses/Orbits: No acute finding. Other: There is soft tissue swelling overlying the posterior right parietal region. IMPRESSION: 1. No acute intracranial hemorrhage. 2. Mild increased asymmetric hyperdensity in the left lentiform. Findings are indeterminate, but can be seen with nonketotic hyperglycemic hemichorea. 3. Soft tissue swelling overlying the posterior right parietal region. Electronically Signed   By: Ronney Asters M.D.   On: 10/15/2022 17:40   CT Cervical Spine Wo Contrast  Result Date: 10/15/2022 CLINICAL DATA:  Trauma EXAM: CT CERVICAL SPINE WITHOUT CONTRAST TECHNIQUE: Multidetector CT imaging of the cervical spine was performed without intravenous contrast. Multiplanar CT image reconstructions were also generated. RADIATION DOSE REDUCTION: This exam was performed according  to the departmental dose-optimization program which includes automated exposure control, adjustment of the mA and/or kV according to patient size and/or use of iterative reconstruction technique. COMPARISON:  08/20/2022 FINDINGS: Alignment: Postoperative straightening of the normal cervical lordosis. Skull base and vertebrae: No acute fracture. No primary bone lesion or focal pathologic process. Soft tissues and spinal canal: No prevertebral fluid or swelling. No visible canal hematoma. Disc levels: Extensive multilevel discectomy and fusion from C3 through T1. Upper chest: Negative. Other: None. IMPRESSION: 1. No fracture or static subluxation of the cervical spine. 2. Extensive multilevel discectomy and fusion from C3 through T1. Electronically Signed   By: Delanna Ahmadi M.D.   On: 10/15/2022 17:31   DG Shoulder Right  Result Date: 10/15/2022 CLINICAL DATA:  Pelvis morning.  Possible history of fracture. EXAM: RIGHT SHOULDER - 2+ VIEW; RIGHT HUMERUS - 2+ VIEW COMPARISON:  Right shoulder radiographs 03/28/2022, 07/24/2022; FINDINGS: There is diffuse decreased bone mineralization. Redemonstration of comminuted, impacted now  subacute to chronic fracture of the proximal right humerus including the inferior aspect of the humeral head and the surgical neck. There is high-grade (approximately 2.7 cm craniocaudal dimension) impaction of the humeral shaft into the inferior aspect of the humeral head. High-grade bone loss of the inferior aspect of the humeral head. Approximately 1.4 cm anterior displacement of the humeral shaft on lateral view, unchanged. Moderate glenohumeral joint space narrowing. Old unchanged bone fragments overlying the inferior glenohumeral joint space. Remote healed fracture of the mid to lateral right clavicular shaft with associated clavicle shortening, unchanged. No acute fracture is seen.  No dislocation. Partial visualization of lower cervical spine anterior and posterior fusion hardware.  IMPRESSION: Redemonstration of comminuted, impacted now subacute to chronic fracture of the proximal right humerus including the inferior aspect of the humeral head and the surgical neck. There is high-grade impaction of the humeral shaft into the inferior aspect of the humeral head. No significant change from 07/24/2022. Electronically Signed   By: Yvonne Kendall M.D.   On: 10/15/2022 17:11   DG Humerus Right  Result Date: 10/15/2022 CLINICAL DATA:  Pelvis morning.  Possible history of fracture. EXAM: RIGHT SHOULDER - 2+ VIEW; RIGHT HUMERUS - 2+ VIEW COMPARISON:  Right shoulder radiographs 03/28/2022, 07/24/2022; FINDINGS: There is diffuse decreased bone mineralization. Redemonstration of comminuted, impacted now subacute to chronic fracture of the proximal right humerus including the inferior aspect of the humeral head and the surgical neck. There is high-grade (approximately 2.7 cm craniocaudal dimension) impaction of the humeral shaft into the inferior aspect of the humeral head. High-grade bone loss of the inferior aspect of the humeral head. Approximately 1.4 cm anterior displacement of the humeral shaft on lateral view, unchanged. Moderate glenohumeral joint space narrowing. Old unchanged bone fragments overlying the inferior glenohumeral joint space. Remote healed fracture of the mid to lateral right clavicular shaft with associated clavicle shortening, unchanged. No acute fracture is seen.  No dislocation. Partial visualization of lower cervical spine anterior and posterior fusion hardware. IMPRESSION: Redemonstration of comminuted, impacted now subacute to chronic fracture of the proximal right humerus including the inferior aspect of the humeral head and the surgical neck. There is high-grade impaction of the humeral shaft into the inferior aspect of the humeral head. No significant change from 07/24/2022. Electronically Signed   By: Yvonne Kendall M.D.   On: 10/15/2022 17:11    Orson Eva, DO  [facility]  If 7PM-7AM, please contact night-coverage [email or web address] Password [email or web address] 10/16/2022, 8:51 AM   LOS: 0 days  Right humerus fracture

## 2022-10-16 NOTE — Inpatient Diabetes Management (Signed)
Inpatient Diabetes Program Recommendations  AACE/ADA: New Consensus Statement on Inpatient Glycemic Control   Target Ranges:  Prepandial:   less than 140 mg/dL      Peak postprandial:   less than 180 mg/dL (1-2 hours)      Critically ill patients:  140 - 180 mg/dL    Latest Reference Range & Units 10/15/22 19:12 10/15/22 20:38 10/15/22 22:48 10/16/22 00:34 10/16/22 07:46 10/16/22 08:06  Glucose-Capillary 70 - 99 mg/dL 437 (H) 355 (H) 257 (H) 261 (H) 44 (LL) 59 (L)   Review of Glycemic Control  Diabetes history: DM2 Outpatient Diabetes medications: Glipizide 5 mg daily, Farxiga 10 mg daily, Levemir 20 units QHS, Novolog 5-11 units TID with meals Current orders for Inpatient glycemic control: Levemir 20 units QHS, Novolog 0-15 units TID with meals, Novolog 0-5 units QHS  Inpatient Diabetes Program Recommendations:    Insulin: CBG 44 mg/dl at 7:46 am today. Please consider decreasing Levemir to 10 units QHS.  Thanks, Barnie Alderman, RN, MSN, [place] [facility] Coordinator Inpatient Diabetes Program [phone] (Team Pager from 8am to [place])

## 2022-10-16 NOTE — TOC Progression Note (Signed)
  Transition of Care [facility]) Screening Note   Patient Details  Name: Heather [name] Date of Birth: [birth year]   Transition of Care [facility]) CM/SW Contact:    Shade Flood, LCSW Phone Number: 10/16/2022, 11:55 AM    Transition of Care Department [facility]) has reviewed patient and no TOC needs have been identified at this time. We will continue to monitor patient advancement through interdisciplinary progression rounds. If new patient transition needs arise, please place a TOC consult.

## 2022-10-16 NOTE — Consult Note (Signed)
[facility] Nurse Consult Note: Reason for Consult:gluteal wound Wound type: Deep Tissue Pressure Injury Patient has sustained several falls per review of records, it is not clear if she injured this area in a fall. Will assume pressure based on location and discussion with bedside nurse Pressure Injury POA: yes Measurement:see nursing flowsheets when patient has first 2RN skin assessment  Wound DVV:OHYW purple along proximal aspect of wound bed with superficial partial thickness skin loss over 50% of the wound bed, this area is pink Drainage (amount, consistency, odor) unable to assess in images provided  Periwound:appears to have extension of area at 10-11 o'clock that is also discolored but intact  Dressing procedure/placement/frequency: Silicone foam per the nursing skin care order set, notify [place] nursing team for acute changes.    Re consult if needed, will not follow at this time. Thanks  Jovann Luse [employer], RN,CWOCN, CNS, [place] [phone])

## 2022-10-16 NOTE — ED Notes (Signed)
Pt alert with episode of hypoglycemia, pt given 8oz of juice, will recheck CBG in 15 mins

## 2022-10-16 NOTE — ED Notes (Signed)
PT given juice.

## 2022-10-16 NOTE — ED Notes (Signed)
Patient denies pain and is resting comfortably.  

## 2022-10-16 NOTE — ED Notes (Signed)
Upon insertion of in and out cath, my witness and I noticed green discharge on the tip of the catheter.

## 2022-10-17 ENCOUNTER — Observation Stay (HOSPITAL_COMMUNITY): Payer: Medicare HMO

## 2022-10-17 DIAGNOSIS — E871 Hypo-osmolality and hyponatremia: Secondary | ICD-10-CM | POA: Diagnosis present

## 2022-10-17 DIAGNOSIS — M545 Low back pain, unspecified: Secondary | ICD-10-CM | POA: Diagnosis present

## 2022-10-17 DIAGNOSIS — I10 Essential (primary) hypertension: Secondary | ICD-10-CM

## 2022-10-17 DIAGNOSIS — M25552 Pain in left hip: Secondary | ICD-10-CM | POA: Diagnosis present

## 2022-10-17 DIAGNOSIS — K5090 Crohn's disease, unspecified, without complications: Secondary | ICD-10-CM | POA: Diagnosis present

## 2022-10-17 DIAGNOSIS — F32A Depression, unspecified: Secondary | ICD-10-CM | POA: Diagnosis present

## 2022-10-17 DIAGNOSIS — I5032 Chronic diastolic (congestive) heart failure: Secondary | ICD-10-CM | POA: Diagnosis present

## 2022-10-17 DIAGNOSIS — R296 Repeated falls: Secondary | ICD-10-CM | POA: Diagnosis present

## 2022-10-17 DIAGNOSIS — D631 Anemia in chronic kidney disease: Secondary | ICD-10-CM | POA: Diagnosis present

## 2022-10-17 DIAGNOSIS — N179 Acute kidney failure, unspecified: Secondary | ICD-10-CM | POA: Diagnosis present

## 2022-10-17 DIAGNOSIS — I2510 Atherosclerotic heart disease of native coronary artery without angina pectoris: Secondary | ICD-10-CM | POA: Diagnosis present

## 2022-10-17 DIAGNOSIS — F419 Anxiety disorder, unspecified: Secondary | ICD-10-CM | POA: Diagnosis present

## 2022-10-17 DIAGNOSIS — Z794 Long term (current) use of insulin: Secondary | ICD-10-CM | POA: Diagnosis not present

## 2022-10-17 DIAGNOSIS — S82002A Unspecified fracture of left patella, initial encounter for closed fracture: Secondary | ICD-10-CM | POA: Diagnosis present

## 2022-10-17 DIAGNOSIS — E1165 Type 2 diabetes mellitus with hyperglycemia: Secondary | ICD-10-CM | POA: Diagnosis not present

## 2022-10-17 DIAGNOSIS — Z96641 Presence of right artificial hip joint: Secondary | ICD-10-CM | POA: Diagnosis present

## 2022-10-17 DIAGNOSIS — E875 Hyperkalemia: Secondary | ICD-10-CM | POA: Diagnosis present

## 2022-10-17 DIAGNOSIS — I130 Hypertensive heart and chronic kidney disease with heart failure and stage 1 through stage 4 chronic kidney disease, or unspecified chronic kidney disease: Secondary | ICD-10-CM | POA: Diagnosis present

## 2022-10-17 DIAGNOSIS — E1140 Type 2 diabetes mellitus with diabetic neuropathy, unspecified: Secondary | ICD-10-CM | POA: Diagnosis present

## 2022-10-17 DIAGNOSIS — E1110 Type 2 diabetes mellitus with ketoacidosis without coma: Secondary | ICD-10-CM | POA: Diagnosis present

## 2022-10-17 DIAGNOSIS — W19XXXA Unspecified fall, initial encounter: Secondary | ICD-10-CM | POA: Diagnosis present

## 2022-10-17 DIAGNOSIS — S42301A Unspecified fracture of shaft of humerus, right arm, initial encounter for closed fracture: Secondary | ICD-10-CM | POA: Diagnosis present

## 2022-10-17 DIAGNOSIS — E782 Mixed hyperlipidemia: Secondary | ICD-10-CM | POA: Diagnosis present

## 2022-10-17 DIAGNOSIS — E1122 Type 2 diabetes mellitus with diabetic chronic kidney disease: Secondary | ICD-10-CM | POA: Diagnosis present

## 2022-10-17 DIAGNOSIS — M25511 Pain in right shoulder: Secondary | ICD-10-CM | POA: Diagnosis present

## 2022-10-17 DIAGNOSIS — E860 Dehydration: Secondary | ICD-10-CM | POA: Diagnosis present

## 2022-10-17 DIAGNOSIS — N184 Chronic kidney disease, stage 4 (severe): Secondary | ICD-10-CM | POA: Diagnosis present

## 2022-10-17 LAB — BASIC METABOLIC PANEL
Anion gap: 8 (ref 5–15)
BUN: 73 mg/dL — ABNORMAL HIGH (ref 8–23)
CO2: 18 mmol/L — ABNORMAL LOW (ref 22–32)
Calcium: 8.5 mg/dL — ABNORMAL LOW (ref 8.9–10.3)
Chloride: 104 mmol/L (ref 98–111)
Creatinine, Ser: 3.06 mg/dL — ABNORMAL HIGH (ref 0.44–1.00)
GFR, Estimated: 16 mL/min — ABNORMAL LOW (ref >60)
Glucose, Bld: 152 mg/dL — ABNORMAL HIGH (ref 70–99)
Potassium: 4.8 mmol/L (ref 3.5–5.1)
Sodium: 130 mmol/L — ABNORMAL LOW (ref 135–145)

## 2022-10-17 LAB — GLUCOSE, CAPILLARY
Glucose-Capillary: 90 mg/dL (ref 70–99)
Glucose-Capillary: 109 mg/dL — ABNORMAL HIGH (ref 70–99)
Glucose-Capillary: 100 mg/dL — ABNORMAL HIGH (ref 70–99)
Glucose-Capillary: 113 mg/dL — ABNORMAL HIGH (ref 70–99)
Glucose-Capillary: 175 mg/dL — ABNORMAL HIGH (ref 70–99)

## 2022-10-17 LAB — CBC
HCT: 23 % — ABNORMAL LOW (ref 36.0–46.0)
Hemoglobin: 7.4 g/dL — ABNORMAL LOW (ref 12.0–15.0)
MCH: 30.3 pg (ref 26.0–34.0)
MCHC: 32.2 g/dL (ref 30.0–36.0)
MCV: 94.3 fL (ref 80.0–100.0)
Platelets: 169 10*3/uL (ref 150–400)
RBC: 2.44 MIL/uL — ABNORMAL LOW (ref 3.87–5.11)
RDW: 13.8 % (ref 11.5–15.5)
WBC: 10.1 10*3/uL (ref 4.0–10.5)
nRBC: 0 % (ref 0.0–0.2)

## 2022-10-17 LAB — IRON AND TIBC
Iron: 92 ug/dL (ref 28–170)
Saturation Ratios: 36 % — ABNORMAL HIGH (ref 10.4–31.8)
TIBC: 255 ug/dL (ref 250–450)
UIBC: 163 ug/dL

## 2022-10-17 LAB — HEMOGLOBIN A1C
Hgb A1c MFr Bld: >15.5 % — ABNORMAL HIGH (ref 4.8–5.6)
Hgb A1c MFr Bld: >15.5 % — ABNORMAL HIGH (ref 4.8–5.6)
Mean Plasma Glucose: >398 mg/dL
Mean Plasma Glucose: >398 mg/dL

## 2022-10-17 LAB — RETICULOCYTES
Immature Retic Fract: 9.2 % (ref 2.3–15.9)
RBC.: 2.45 MIL/uL — ABNORMAL LOW (ref 3.87–5.11)
Retic Count, Absolute: 76.6 10*3/uL (ref 19.0–186.0)
Retic Ct Pct: 3.1 % (ref 0.4–3.1)

## 2022-10-17 LAB — MAGNESIUM: Magnesium: 1.9 mg/dL (ref 1.7–2.4)

## 2022-10-17 LAB — URINE CULTURE: Culture: NO GROWTH

## 2022-10-17 LAB — VITAMIN B12: Vitamin B-12: 853 pg/mL (ref 180–914)

## 2022-10-17 LAB — FERRITIN: Ferritin: 1676 ng/mL — ABNORMAL HIGH (ref 11–307)

## 2022-10-17 LAB — FOLATE: Folate: 10 ng/mL (ref >5.9)

## 2022-10-17 MED ORDER — OXYCODONE-ACETAMINOPHEN 5-325 MG PO TABS
1.0000 | ORAL_TABLET | Freq: Four times a day (QID) | ORAL | Status: DC | PRN
  Administered 2022-10-17 – 2022-10-18 (×3): 1 via ORAL
  Filled 2022-10-17 (×4): qty 1

## 2022-10-17 MED ORDER — HYDROMORPHONE HCL 2 MG PO TABS
1.0000 mg | ORAL_TABLET | ORAL | Status: DC | PRN
  Administered 2022-10-17 – 2022-10-21 (×14): 1 mg via ORAL
  Filled 2022-10-17 (×15): qty 1

## 2022-10-17 MED ORDER — PANCRELIPASE (LIP-PROT-AMYL) 12000-38000 UNITS PO CPEP
36000.0000 [IU] | ORAL_CAPSULE | Freq: Three times a day (TID) | ORAL | Status: DC
  Administered 2022-10-17 – 2022-10-21 (×11): 36000 [IU] via ORAL
  Filled 2022-10-17 (×12): qty 3

## 2022-10-17 MED ORDER — ONDANSETRON HCL 4 MG PO TABS
4.0000 mg | ORAL_TABLET | Freq: Three times a day (TID) | ORAL | Status: DC | PRN
  Administered 2022-10-17 – 2022-10-20 (×3): 4 mg via ORAL
  Filled 2022-10-17 (×2): qty 1

## 2022-10-17 NOTE — Progress Notes (Signed)
PROGRESS NOTE  Heather [name] OYD:[id number] DOB: Aug 18, 1951 DOA: 10/15/2022 PCP: [name], MD  Brief History:  71 y/o female with history of diabetes mellitus type 2, hyperlipidemia, dCHF, Crohn's disease, CKD stage 4, B12 deficiency, anxiety disorder, stroke, HTN, chronic pancreatitis, mixed hyperlipidemia presenting with another fall.  She denies syncope.  Apparently, the patient fell 2 days prior to admission in the bathroom and hit the side of her head.  She she has been complaining of pain in her head and right shoulder since then.  The patient has had a history of frequent falls in the past 12 months.  She has chronic right leg weakness which seems to have started in late January 2023 after a fall.  She has been complaining of low back pain secondary to her falls.  She denies any syncope.  She was most recently admitted from 07/04/2022 to 07/07/2022 with acute on chronic pancreatitis, acute on chronic renal failure, and falls.  In addition, she had another hospitalization from 06/20/2022 to 06/22/2022 with acute on chronic pancreatitis, acute on chronic renal failure, and falls.  She has had a known right humerus fracture for which she is following [name].  She endorses dietary indiscretion, but states that she has been taking her insulin. In the ED, the patient was afebrile and hemodynamically stable.  WBC 16.0, hemoglobin 8.0, platelets 204,000.  Sodium 125, potassium 6.3, bicarbonate 13, serum creatinine 3.37, serum glucose of 434.  Assessment/Plan: DKA type II -The patient presented with serum glucose up to 480 with anion gap 16 -Improved with IV fluids and subcutaneous insulin -Patient endorses dietary indiscretion -06/20/2022 hemoglobin A1c 10.0 -A1c> 15.5 -Continue IV fluids  Acute on chronic renal failure--CKD stage IV -Baseline creatinine 2.3-2.6 -Presented with serum creatinine 3.37 -Secondary to volume depletion and hemodynamic changes -With IV fluids,  creatinine has been up to 3.0, BUN still 73 -Will continue with IV fluids for now  Hyperkalemia -Patient was given temporizing measures and Lokelma -Improving -Continue IV fluids  Chronic diastolic CHF -Currently compensated -lower dose metoprolol succinate temporarily secondary to soft blood pressure  Mixed hyperlipidemia -Continue statin  Essential hypertension -Holding hydralazine secondary to soft blood pressures -start metoprolol succinate at lower dose -Blood pressure currently stable  Crohn's disease -No active symptoms presently -Outpatient GI follow-up  Uncontrolled diabetes mellitus type 2 with hyperglycemia -A1c> 15.5 -NovoLog sliding scale -Patient has had brittle diabetes with episodes of hypoglycemia -Decrease Levemir to 10 units -Blood sugars have been stable in the hospital  Anxiety/depression -Continue Effexor, Geodon  Right Humerus fracture -Xray in ED shows no change when compared to 07/24/22 -Follow-up with orthopedics  Left patella fracture -Previously seen by orthopedics and was placed in knee immobilizer -Nonweightbearing left leg -Follow-up with orthopedics  Low back pain -Reports that this began after a fall -Reports that pain is rating down her legs -Check MRI lumbar spine  Anemia, likely related to chronic kidney disease -No reported history of bleeding -Check FOBT -B12 853, folate 10.0 -Transfuse for hemoglobin less than 7  Generalized weakness Fall prior to admission -PT OT    Family Communication: Updated husband over the phone  Consultants:  none  Code Status:  FULL  DVT Prophylaxis:  [place] Heparin    Procedures: As Listed in Progress Note Above  Antibiotics: None  RN Pressure Injury Documentation: Pressure Injury 08/26/21 Buttocks Right;Left Stage 1 -  Intact skin with non-blanchable redness of a localized area usually over a  bony prominence. (Active)  08/26/21 0050  Location: Buttocks  Location Orientation:  Right;Left  Staging: Stage 1 -  Intact skin with non-blanchable redness of a localized area usually over a bony prominence.  Wound Description (Comments):   Present on Admission: Yes     Pressure Injury 03/16/22 Buttocks Left;Medial Stage 2 -  Partial thickness loss of dermis presenting as a shallow open injury with a red, pink wound bed without slough. pink wound bed, perimeter reddened and purple (Active)  03/16/22 0015  Location: Buttocks  Location Orientation: Left;Medial  Staging: Stage 2 -  Partial thickness loss of dermis presenting as a shallow open injury with a red, pink wound bed without slough.  Wound Description (Comments): pink wound bed, perimeter reddened and purple  Present on Admission: Yes  Dressing Type Foam - Lift dressing to assess site every shift 10/17/22 1000     Pressure Injury 06/21/22 Coccyx Right Stage 1 -  Intact skin with non-blanchable redness of a localized area usually over a bony prominence. red circular 0.5cm pressure injury directly on boney sacrum (Active)  06/21/22 1000  Location: Coccyx  Location Orientation: Right  Staging: Stage 1 -  Intact skin with non-blanchable redness of a localized area usually over a bony prominence.  Wound Description (Comments): red circular 0.5cm pressure injury directly on boney sacrum  Present on Admission:         Subjective: Reports of pain in her right arm and low back since falling prior to admission.  Reports that pain in her low back radiates down her legs.  Objective: Vitals:   10/16/22 2145 10/17/22 0151 10/17/22 0541 10/17/22 1550  BP: (!) 123/52 (!) 103/43 108/66 (!) 120/55  Pulse: 65 (!) 55 62 60  Resp: '20 14 18 12  '$ Temp: (!) 97.3 F (36.3 C) (!) 97 F (36.1 C) 98.1 F (36.7 C) 97.7 F (36.5 C)  TempSrc: Oral   Oral  SpO2: 100% 97% 100% 98%  Weight:   57 kg   Height:        Intake/Output Summary (Last 24 hours) at 10/17/2022 2032 Last data filed at 10/17/2022 0554 Gross per 24 hour   Intake 1296.97 ml  Output --  Net 1296.97 ml   Weight change: 2.568 kg Exam:  General:  Pt is alert, follows commands appropriately, not in acute distress HEENT: No icterus, No thrush, No neck mass, Foley/AT Cardiovascular: RRR, S1/S2, no rubs, no gallops Respiratory: Bibasilar crackles.  No wheezing.  Good air movement Abdomen: Soft/+BS, non tender, non distended, no guarding Extremities: No edema, No lymphangitis, No petechiae, No rashes, no synovitis    Data Reviewed: I have personally reviewed following labs and imaging studies Basic Metabolic Panel: Recent Labs  Lab 10/15/22 1609 10/15/22 1944 10/16/22 0502 10/17/22 0419  NA 124* 125* 130* 130*  K 6.1* 6.3* 5.0 4.8  CL 94* 96* 102 104  CO2 18* 13* 19* 18*  GLUCOSE 480* 434* 88 152*  BUN 77* 77* 77* 73*  CREATININE 3.27* 3.37* 3.17* 3.06*  CALCIUM 9.6 9.3 9.0 8.5*  MG  --  2.1  --  1.9   Liver Function Tests: Recent Labs  Lab 10/16/22 0502  AST 16  ALT 13  ALKPHOS 84  BILITOT 0.5  PROT 5.8*  ALBUMIN 2.6*   No results for input(s): "LIPASE", "AMYLASE" in the last 168 hours. No results for input(s): "AMMONIA" in the last 168 hours. Coagulation Profile: No results for input(s): "INR", "PROTIME" in the last 168 hours. CBC:  Recent Labs  Lab 10/15/22 1609 10/16/22 0502 10/17/22 0419  WBC 19.9* 16.0* 10.1  NEUTROABS 17.7*  --   --   HGB 10.0* 8.0* 7.4*  HCT 30.3* 24.9* 23.0*  MCV 92.7 94.7 94.3  PLT 296 184 169   Cardiac Enzymes: No results for input(s): "CKTOTAL", "CKMB", "CKMBINDEX", "TROPONINI" in the last 168 hours. BNP: Invalid input(s): "POCBNP" CBG: Recent Labs  Lab 10/16/22 1802 10/16/22 2149 10/17/22 0743 10/17/22 1124 10/17/22 1636  GLUCAP 121* 171* 109* 100* 113*   HbA1C: Recent Labs    10/15/22 1609 10/16/22 0919  HGBA1C >15.5* >15.5*   Urine analysis:    Component Value Date/Time   COLORURINE YELLOW 10/16/2022 1413   APPEARANCEUR HAZY (A) 10/16/2022 1413   LABSPEC 1.011  10/16/2022 1413   PHURINE 5.0 10/16/2022 1413   GLUCOSEU >=500 (A) 10/16/2022 1413   GLUCOSEU NEGATIVE 03/28/2018 [place] 10/16/2022 [place] 10/16/2022 1413   KETONESUR NEGATIVE 10/16/2022 1413   PROTEINUR >=300 (A) 10/16/2022 1413   UROBILINOGEN 1.0 03/28/2018 1224   NITRITE NEGATIVE 10/16/2022 1413   LEUKOCYTESUR LARGE (A) 10/16/2022 1413   Sepsis Labs: '@LABRCNTIP'$ (procalcitonin:4,lacticidven:4) ) Recent Results (from the past 240 hour(s))  Urine Culture     Status: None   Collection Time: 10/16/22  2:13 PM   Specimen: Urine, Clean Catch  Result Value Ref Range Status   Specimen Description   Final    URINE, CLEAN CATCH Performed at [facility], [address], [place], [place] [address]    Special Requests   Final    NONE Performed at [facility], [address] [place], [place] [address]    Culture   Final    NO GROWTH Performed at [facility], [place] [address] [place], [place] [address]    Report Status 10/17/2022 FINAL  Final     Scheduled Meds:  acetaminophen  1,000 mg Oral Q6H   atorvastatin  80 mg Oral Daily   dicyclomine  20 mg Oral BID   gabapentin  300 mg Oral QHS   heparin  5,000 Units Subcutaneous Q8H   insulin aspart  0-5 Units Subcutaneous QHS   insulin aspart  0-9 Units Subcutaneous TID WC   insulin detemir  10 Units Subcutaneous QHS   levETIRAcetam  500 mg Oral BID   lipase/protease/amylase  36,000 Units Oral TID AC   metoprolol succinate  25 mg Oral Daily   pantoprazole  40 mg Oral Daily   tiZANidine  4 mg Oral BID   venlafaxine XR  150 mg Oral Daily   ziprasidone  20 mg Oral BID   Continuous Infusions:  sodium chloride 100 mL/hr at 10/15/22 2353    Procedures/Studies: MR LUMBAR SPINE WO CONTRAST  Result Date: 10/17/2022 CLINICAL DATA:  Low back pain after a fall. EXAM: MRI LUMBAR SPINE WITHOUT CONTRAST TECHNIQUE: Multiplanar, multisequence MR imaging of the lumbar spine was performed. No  intravenous contrast was administered. COMPARISON:  MRI lumbar spine dated June 20, 2022. FINDINGS: Segmentation:  Standard. Alignment:  Unchanged 3 mm retrolisthesis at L1-L2. Vertebrae: Chronic T12 superior endplate compression fracture continues to demonstrate fluid within the fracture cleft and associated marrow edema, without progressive height loss. Unchanged 3 mm retropulsion of the posterosuperior endplate. No new fracture. Prior L1-L4 fusion. No evidence of discitis or suspicious bone lesion. Conus medullaris and cauda equina: Conus extends to the T12-L1 level. Conus and cauda equina appear normal. Paraspinal and other soft tissues: Negative. Disc levels: T11-T12:  Unchanged minimal disc bulging and mild bilateral facet arthropathy. Unchanged mild right neuroforaminal stenosis. No spinal canal or left neuroforaminal stenosis. T12-L1:  Unchanged minimal disc bulging.  No stenosis. L1-L2: Prior posterior fusion. No significant disc bulge or herniation. No stenosis. L2-L3:  Prior PLIF.  No residual stenosis. L3-L4:  Prior PLIF.  No residual stenosis. L4-L5: Unchanged small broad-based posterior disc protrusion and moderate bilateral facet arthropathy. Unchanged mild to moderate left neuroforaminal stenosis. No spinal canal or right neuroforaminal stenosis. L5-S1: Negative disc. Unchanged mild-to-moderate bilateral facet arthropathy. No stenosis. IMPRESSION: 1. Chronic T12 superior endplate compression fracture with persistent marrow edema but no progressive height loss. No new fracture. 2. Prior L1-L4 fusion without residual stenosis. 3. Unchanged mild-to-moderate left neuroforaminal stenosis at L4-L5. Electronically Signed   By: Titus Dubin M.D.   On: 10/17/2022 14:58   CT Head Wo Contrast  Result Date: 10/15/2022 CLINICAL DATA:  Head trauma EXAM: CT HEAD WITHOUT CONTRAST TECHNIQUE: Contiguous axial images were obtained from the base of the skull through the vertex without intravenous contrast.  RADIATION DOSE REDUCTION: This exam was performed according to the departmental dose-optimization program which includes automated exposure control, adjustment of the mA and/or kV according to patient size and/or use of iterative reconstruction technique. COMPARISON:  CT head 08/22/2022.  MRI brain 08/23/2022. FINDINGS: Brain: No evidence of acute infarction, hemorrhage, hydrocephalus, extra-axial collection or mass lesion/mass effect. There is mild increased asymmetric hyperdensity in the left lentiform. There is no surrounding edema or mass effect. This is a new finding. This is area not convincing for acute hemorrhage. Vascular: Atherosclerotic calcifications are present within the cavernous internal carotid arteries. Skull: Normal. Negative for fracture or focal lesion. Sinuses/Orbits: No acute finding. Other: There is soft tissue swelling overlying the posterior right parietal region. IMPRESSION: 1. No acute intracranial hemorrhage. 2. Mild increased asymmetric hyperdensity in the left lentiform. Findings are indeterminate, but can be seen with nonketotic hyperglycemic hemichorea. 3. Soft tissue swelling overlying the posterior right parietal region. Electronically Signed   By: Ronney Asters M.D.   On: 10/15/2022 17:40   CT Cervical Spine Wo Contrast  Result Date: 10/15/2022 CLINICAL DATA:  Trauma EXAM: CT CERVICAL SPINE WITHOUT CONTRAST TECHNIQUE: Multidetector CT imaging of the cervical spine was performed without intravenous contrast. Multiplanar CT image reconstructions were also generated. RADIATION DOSE REDUCTION: This exam was performed according to the departmental dose-optimization program which includes automated exposure control, adjustment of the mA and/or kV according to patient size and/or use of iterative reconstruction technique. COMPARISON:  08/20/2022 FINDINGS: Alignment: Postoperative straightening of the normal cervical lordosis. Skull base and vertebrae: No acute fracture. No primary  bone lesion or focal pathologic process. Soft tissues and spinal canal: No prevertebral fluid or swelling. No visible canal hematoma. Disc levels: Extensive multilevel discectomy and fusion from C3 through T1. Upper chest: Negative. Other: None. IMPRESSION: 1. No fracture or static subluxation of the cervical spine. 2. Extensive multilevel discectomy and fusion from C3 through T1. Electronically Signed   By: Delanna Ahmadi M.D.   On: 10/15/2022 17:31   DG Shoulder Right  Result Date: 10/15/2022 CLINICAL DATA:  Pelvis morning.  Possible history of fracture. EXAM: RIGHT SHOULDER - 2+ VIEW; RIGHT HUMERUS - 2+ VIEW COMPARISON:  Right shoulder radiographs 03/28/2022, 07/24/2022; FINDINGS: There is diffuse decreased bone mineralization. Redemonstration of comminuted, impacted now subacute to chronic fracture of the proximal right humerus including the inferior aspect of the humeral head and the surgical neck. There is high-grade (approximately 2.7 cm  craniocaudal dimension) impaction of the humeral shaft into the inferior aspect of the humeral head. High-grade bone loss of the inferior aspect of the humeral head. Approximately 1.4 cm anterior displacement of the humeral shaft on lateral view, unchanged. Moderate glenohumeral joint space narrowing. Old unchanged bone fragments overlying the inferior glenohumeral joint space. Remote healed fracture of the mid to lateral right clavicular shaft with associated clavicle shortening, unchanged. No acute fracture is seen.  No dislocation. Partial visualization of lower cervical spine anterior and posterior fusion hardware. IMPRESSION: Redemonstration of comminuted, impacted now subacute to chronic fracture of the proximal right humerus including the inferior aspect of the humeral head and the surgical neck. There is high-grade impaction of the humeral shaft into the inferior aspect of the humeral head. No significant change from 07/24/2022. Electronically Signed   By: Yvonne Kendall M.D.   On: 10/15/2022 17:11   DG Humerus Right  Result Date: 10/15/2022 CLINICAL DATA:  Pelvis morning.  Possible history of fracture. EXAM: RIGHT SHOULDER - 2+ VIEW; RIGHT HUMERUS - 2+ VIEW COMPARISON:  Right shoulder radiographs 03/28/2022, 07/24/2022; FINDINGS: There is diffuse decreased bone mineralization. Redemonstration of comminuted, impacted now subacute to chronic fracture of the proximal right humerus including the inferior aspect of the humeral head and the surgical neck. There is high-grade (approximately 2.7 cm craniocaudal dimension) impaction of the humeral shaft into the inferior aspect of the humeral head. High-grade bone loss of the inferior aspect of the humeral head. Approximately 1.4 cm anterior displacement of the humeral shaft on lateral view, unchanged. Moderate glenohumeral joint space narrowing. Old unchanged bone fragments overlying the inferior glenohumeral joint space. Remote healed fracture of the mid to lateral right clavicular shaft with associated clavicle shortening, unchanged. No acute fracture is seen.  No dislocation. Partial visualization of lower cervical spine anterior and posterior fusion hardware. IMPRESSION: Redemonstration of comminuted, impacted now subacute to chronic fracture of the proximal right humerus including the inferior aspect of the humeral head and the surgical neck. There is high-grade impaction of the humeral shaft into the inferior aspect of the humeral head. No significant change from 07/24/2022. Electronically Signed   By: Yvonne Kendall M.D.   On: 10/15/2022 17:11    Kathie Dike, MD  [facility]  If 7PM-7AM, please contact night-coverage [email or web address]  10/17/2022, 8:32 PM   LOS: 0 days

## 2022-10-17 NOTE — Progress Notes (Signed)
Pt ambulated to bathroom x2 this shift with this RN, urinated moderate amount both times.

## 2022-10-17 NOTE — Progress Notes (Signed)
IV removed not flushing or working Therapist, sports attempted x4 with no success for new placement. MD informed. Called AC for ultrasound guided IV.

## 2022-10-17 NOTE — Care Management Obs Status (Signed)
[place] NOTIFICATION   Patient Details  Name: Heather [name] MRN: [id number] Date of Birth: Dec 01, 1950   Medicare Observation Status Notification Given:  Yes Copy mailed to address on file due to [place] 10/17/2022, 9:07 AM

## 2022-10-18 ENCOUNTER — Inpatient Hospital Stay (HOSPITAL_COMMUNITY): Payer: Medicare HMO

## 2022-10-18 DIAGNOSIS — N184 Chronic kidney disease, stage 4 (severe): Secondary | ICD-10-CM | POA: Diagnosis not present

## 2022-10-18 DIAGNOSIS — N179 Acute kidney failure, unspecified: Secondary | ICD-10-CM | POA: Diagnosis not present

## 2022-10-18 DIAGNOSIS — I10 Essential (primary) hypertension: Secondary | ICD-10-CM | POA: Diagnosis not present

## 2022-10-18 DIAGNOSIS — E1165 Type 2 diabetes mellitus with hyperglycemia: Secondary | ICD-10-CM | POA: Diagnosis not present

## 2022-10-18 LAB — BASIC METABOLIC PANEL
Anion gap: 8 (ref 5–15)
BUN: 72 mg/dL — ABNORMAL HIGH (ref 8–23)
CO2: 18 mmol/L — ABNORMAL LOW (ref 22–32)
Calcium: 8.2 mg/dL — ABNORMAL LOW (ref 8.9–10.3)
Chloride: 104 mmol/L (ref 98–111)
Creatinine, Ser: 3.15 mg/dL — ABNORMAL HIGH (ref 0.44–1.00)
GFR, Estimated: 15 mL/min — ABNORMAL LOW (ref >60)
Glucose, Bld: 136 mg/dL — ABNORMAL HIGH (ref 70–99)
Potassium: 5.9 mmol/L — ABNORMAL HIGH (ref 3.5–5.1)
Sodium: 130 mmol/L — ABNORMAL LOW (ref 135–145)

## 2022-10-18 LAB — CBC
HCT: 22.4 % — ABNORMAL LOW (ref 36.0–46.0)
Hemoglobin: 7 g/dL — ABNORMAL LOW (ref 12.0–15.0)
MCH: 30.2 pg (ref 26.0–34.0)
MCHC: 31.3 g/dL (ref 30.0–36.0)
MCV: 96.6 fL (ref 80.0–100.0)
Platelets: 140 10*3/uL — ABNORMAL LOW (ref 150–400)
RBC: 2.32 MIL/uL — ABNORMAL LOW (ref 3.87–5.11)
RDW: 14 % (ref 11.5–15.5)
WBC: 7.8 10*3/uL (ref 4.0–10.5)
nRBC: 0 % (ref 0.0–0.2)

## 2022-10-18 LAB — GLUCOSE, CAPILLARY
Glucose-Capillary: 91 mg/dL (ref 70–99)
Glucose-Capillary: 68 mg/dL — ABNORMAL LOW (ref 70–99)
Glucose-Capillary: 130 mg/dL — ABNORMAL HIGH (ref 70–99)
Glucose-Capillary: 259 mg/dL — ABNORMAL HIGH (ref 70–99)
Glucose-Capillary: 185 mg/dL — ABNORMAL HIGH (ref 70–99)

## 2022-10-18 LAB — HEMOGLOBIN A1C
Hgb A1c MFr Bld: >15.5 % — ABNORMAL HIGH (ref 4.8–5.6)
Mean Plasma Glucose: >398 mg/dL

## 2022-10-18 LAB — PREPARE RBC (CROSSMATCH)

## 2022-10-18 LAB — CK: Total CK: 49 U/L (ref 38–234)

## 2022-10-18 MED ORDER — SODIUM ZIRCONIUM CYCLOSILICATE 10 G PO PACK
10.0000 g | PACK | Freq: Every day | ORAL | Status: DC
  Administered 2022-10-18 – 2022-10-21 (×4): 10 g via ORAL
  Filled 2022-10-18 (×4): qty 1

## 2022-10-18 MED ORDER — SODIUM CHLORIDE 0.9% IV SOLUTION: Freq: Once | INTRAVENOUS | Status: AC

## 2022-10-18 MED ORDER — DIPHENHYDRAMINE HCL 50 MG/ML IJ SOLN
25.0000 mg | Freq: Once | INTRAMUSCULAR | Status: AC
  Administered 2022-10-19: 25 mg via INTRAVENOUS
  Filled 2022-10-18: qty 1

## 2022-10-18 NOTE — Inpatient Diabetes Management (Signed)
Inpatient Diabetes Program Recommendations  AACE/ADA: New Consensus Statement on Inpatient Glycemic Control   Target Ranges:  Prepandial:   less than 140 mg/dL      Peak postprandial:   less than 180 mg/dL (1-2 hours)      Critically ill patients:  140 - 180 mg/dL    Latest Reference Range & Units 10/18/22 07:43 10/18/22 12:22 10/18/22 14:19  Glucose-Capillary 70 - 99 mg/dL 91 68 (L) 130 (H)    Latest Reference Range & Units 10/17/22 07:43 10/17/22 11:24 10/17/22 16:36 10/17/22 22:04  Glucose-Capillary 70 - 99 mg/dL 109 (H) 100 (H) 113 (H) 175 (H)    Latest Reference Range & Units 10/15/22 16:09 10/16/22 09:19 10/17/22 04:19  Hemoglobin A1C 4.8 - 5.6 % >15.5 (H) >15.5 (H) >15.5 (H)   Review of Glycemic Control  Diabetes history: DM2 Outpatient Diabetes medications: Glipizide 5 mg daily, Farxiga 10 mg daily, Levemir 20 units QHS, Novolog 5-11 units TID with meals Current orders for Inpatient glycemic control: Levemir 10 units QHS, Novolog 0-9 units TID with meals, Novolog 0-5 units QHS   Inpatient Diabetes Program Recommendations:     Insulin: Please consider decreasing Levemir to 5 units QHS.   HbgA1C:  A1C >15.5% on 10/15/22 indicating an average glucose over 398 mg/dl over the past 2-3 months.  NOTE: Spoke with patient about diabetes and home regimen for diabetes control. Patient reports being followed by PCP for diabetes management. When asked about DM medications patient states she takes insulin at home at night (Levemir; reports taking more than 20 units because glucose is high), Novolog as needed based on scale, and Glipizide 5 mg daily. Patient states she is not taking [place] at home. Patient reports that she takes insulin in her arm, abdomen, or thigh and states she rotates sites.  Patient reports checking glucose 2-3 times per day and that it is usually high in the 200-300's mg/dl.  Discussed A1C results (>15.5% on 10/15/22) and explained that current A1C indicates an average  glucose over 398 mg/dl over the past 2-3 months. Discussed glucose and A1C goals. Discussed importance of checking CBGs and maintaining good CBG control to prevent long-term and short-term complications. Explained how hyperglycemia leads to damage within blood vessels which lead to the common complications seen with uncontrolled diabetes. Explained to patient that she is only getting Levemir 10 units QHS and glucose has been trending in low 100's (was actually 68 mg/dl at 12:22; given grape juice). Asked patient if she is taking DM medications consistently and patient admits that she forgets to take medications a lot and can't remember if she took them or not at times. Patient reports that she lives with her husband. Discussed that perhaps her husband could start helping with medications to ensure she is taking them consistently. Also discussed carb modified diet and encouraged patient to follow carb modified diet. Patient reports that she has everything needed for DM at home. Encouraged patient to follow up with PCP and to ask about seeing Endocrinologist for assistance with improving DM control. Patient verbalized understanding of information discussed and reports no further questions at this time related to diabetes.  Thanks, Barnie Alderman, RN, MSN, CDE Diabetes Coordinator Inpatient Diabetes Program [phone] (Team Pager)

## 2022-10-18 NOTE — Progress Notes (Signed)
Transition of Care (TOC) -30 day Note       Patient Details  Name: Heather [name] Date of Birth: Mar 11, 1951   [facility]) CM/SW Contact  Name: Shade Flood Phone Number: [phone] Date: 10/18/2022 Time: 2217     To Whom it May Concern:   Please be advised that the above patient will require a short-term nursing home stay, anticipated 30 days or less rehabilitation and strengthening. The plan is for return home.

## 2022-10-18 NOTE — Progress Notes (Addendum)
Initial Nutrition Assessment  DOCUMENTATION CODES:      INTERVENTION:  Ensure Enlive po BID   Obtain meal preferences   Limit high potassium foods   Diabetes Nutrition Edu attached to AVS  NUTRITION DIAGNOSIS:   Inadequate oral intake related to decreased appetite as evidenced by  (energy intake-average <75% since admission per documentation and observation).  GOAL:  Patient will meet greater than or equal to 90% of their needs  MONITOR:  PO intake, Supplement acceptance, Labs, Skin, Weight trends  REASON FOR ASSESSMENT:   Malnutrition Screening Tool    ASSESSMENT: Patient is a 71 yo female with history of Crohn's disease, DM2 (poorly controlled), CKD4, dCHF, B-12 deficiency, anemia, chronic pancreatitis, right humerus fracture and confusion.    Patient reports appetite "not too good". She has eaten~ 50% of lunch today -1/2 fish sandwich, slaw, tea and ice cream. Feeding herself. No family present during RD visit.  Since August patient weight has been ranging between 54-58 kg. Ambulates with a walker per NT.   Medications: lipitor, novolog, levimir, creon, protonix, lokelma.  IVF-NS@ 100 ml/hr  CBG (last 3)  Recent Labs    10/17/22 2204 10/18/22 0743 10/18/22 1222  GLUCAP 175* 91 68*   12/20-A1C-15.5%     Latest Ref Rng & Units 10/18/2022    4:23 AM 10/17/2022    4:19 AM 10/16/2022    5:02 AM  BMP  Glucose 70 - 99 mg/dL 136  152  88   BUN 8 - 23 mg/dL 72  73  77   Creatinine 0.44 - 1.00 mg/dL 3.15  3.06  3.17   Sodium 135 - 145 mmol/L 130  130  130   Potassium 3.5 - 5.1 mmol/L 5.9  4.8  5.0   Chloride 98 - 111 mmol/L 104  104  102   CO2 22 - 32 mmol/L '18  18  19   '$ Calcium 8.9 - 10.3 mg/dL 8.2  8.5  9.0       NUTRITION - FOCUSED PHYSICAL EXAM:  Flowsheet Row Most Recent Value  Orbital Region No depletion  Upper Arm Region No depletion  Thoracic and Lumbar Region No depletion  Buccal Region Mild depletion  Temple Region No depletion  Clavicle and  Acromion Bone Region Moderate depletion  Scapular Bone Region Mild depletion  Dorsal Hand No depletion  Patellar Region Moderate depletion  Anterior Thigh Region Moderate depletion  Posterior Calf Region Moderate depletion  Edema (RD Assessment) None  Hair Reviewed  Eyes Reviewed  Mouth Reviewed  Skin Reviewed  Nails Reviewed       Diet Order:   Diet Order             Diet Carb Modified Fluid consistency: Thin; Room service appropriate? Yes  Diet effective now                   EDUCATION NEEDS:  Not appropriate for education at this time  Skin:  Skin Assessment: Reviewed RN Assessment (stage 2 left buttock)- stage 2 wound left buttock  Last BM:  10/18/22  Height:   Ht Readings from Last 1 Encounters:  10/15/22 '5\' 4"'$  (1.626 m)    Weight:   Wt Readings from Last 1 Encounters:  10/18/22 58.2 kg    Ideal Body Weight:   55 kg  BMI:  Body mass index is 22.02 kg/m.  Estimated Nutritional Needs:   Kcal:  1700-1800  Protein:  78-85 gr  Fluid:  < 2 liters daily  Heather [name]  Kerwin Augustus MS,RD,CSG,LDN Contact: AMION

## 2022-10-18 NOTE — TOC Initial Note (Signed)
Transition of Care [facility]) - Initial/Assessment Note    Patient Details  Name: Heather [name] MRN: [id number] Date of Birth: 26-Mar-1951  Transition of Care [facility] CM/SW Contact:    Shade Flood, LCSW Phone Number: 10/18/2022, 12:45 PM  Clinical Narrative:                  PT recommending SNF rehab at dc. TOC spoke with pt who did not appear to be oriented. Called pt's husband who states that pt has ongoing disorientation for the last year. Discussed PT recommendation for SNF rehab and he is agreeable. CMS provider options reviewed. Will refer as requested. MD anticipating Saturday dc. Will start insurance auth.  TOC will follow.  Planned Disposition: Skilled Nursing Facility Barriers to Discharge: Continued Medical Work up   Patient Goals and CMS Choice Patient states their goals for this hospitalization and ongoing recovery are:: rehab CMS Medicare.gov Compare Post Acute Care list provided to:: Patient Represenative (must comment) Choice offered to / list presented to : [place] ownership interest in [facility].provided to:: Spouse    Expected Discharge Plan and Services Planned Disposition: [place] In-house Referral: Clinical Social Work   Post Acute Care Choice: [place] Living arrangements for the past 2 months: Mobile Home                                      Prior Living Arrangements/Services Living arrangements for the past 2 months: Mobile Home Lives with:: Spouse Patient language and need for interpreter reviewed:: Yes Do you feel safe going back to the place where you live?: Yes      Need for Family Participation in Patient Care: Yes (Comment) Care giver support system in place?: Yes (comment)   Criminal Activity/Legal Involvement Pertinent to Current Situation/Hospitalization: No - Comment as needed  Activities of Daily Living Home Assistive Devices/Equipment: Cane (specify quad or straight),  Shower chair with back ADL Screening (condition at time of admission) Patient's cognitive ability adequate to safely complete daily activities?: Yes Is the patient deaf or have difficulty hearing?: No Does the patient have difficulty seeing, even when wearing glasses/contacts?: No Does the patient have difficulty concentrating, remembering, or making decisions?: No Patient able to express need for assistance with ADLs?: Yes Does the patient have difficulty dressing or bathing?: No Independently performs ADLs?: Yes (appropriate for developmental age) Does the patient have difficulty walking or climbing stairs?: Yes Weakness of Legs: Both Weakness of Arms/Hands: Right  Permission Sought/Granted Permission sought to share information with : Facility Art therapist granted to share information with : Yes, Verbal Permission Granted     Permission granted to share info w AGENCY: snfs        Emotional Assessment       Orientation: : Oriented to Self Alcohol / Substance Use: Not Applicable Psych Involvement: No (comment)  Admission diagnosis:  Hyperkalemia [E87.5] AKI (acute kidney injury) ([place]) [N17.9] Patient Active Problem List   Diagnosis Date Noted   AKI (acute kidney injury) ([place]) 10/17/2022   DKA, type 2 ([place]) 10/16/2022   History of right shoulder fracture 10/15/2022   Aortic atherosclerosis ([place]) 08/01/2022   Multiple falls 07/04/2022   Humeral head fracture 07/04/2022   Right leg weakness 06/20/2022   Anxiety 06/20/2022   UTI (urinary tract infection) 06/20/2022   Malnutrition of moderate degree 06/20/2022   Hyperkalemia 06/19/2022   Type  2 diabetes mellitus with hyperosmolar nonketotic hyperglycemia ([place]) 05/27/2022   Acute kidney failure ([place]) 03/17/2022   Mixed diabetic hyperlipidemia associated with type 2 diabetes mellitus ([place]) 03/16/2022   Chronic diastolic CHF (congestive heart failure) ([place]) 03/16/2022   Decubitus ulcer of left buttock,  stage 2 ([place]) 03/16/2022   Cellulitis of left hand 11/30/2021   Closed fracture of part of upper end of humerus 11/30/2021   At risk for falls 11/28/2021   Neoplasm of meninges 11/28/2021   Sepsis ([place]) 11/28/2021   Acute on chronic anemia 11/28/2021   Cerebral thrombosis with cerebral infarction 08/28/2021   Cerebrovascular accident (CVA) due to embolism of cerebral artery ([place])    Pressure injury of skin 08/26/2021   Acute respiratory failure with hypoxia ([place]) 44/10/270   Acute metabolic encephalopathy 53/66/4403   Malignant hypertension 08/25/2021   Thrombocytopenia ([place]) 08/25/2021   Anemia due to chronic kidney disease 08/25/2021   Blurry vision, bilateral 08/16/2021   Hyponatremia 04/01/2021   COVID-19 03/30/2021   Senile osteoporosis 02/10/2021   Macrocytic anemia    E coli enteritis 01/09/2021   Acute pancreatitis 01/08/2021   Acute renal failure superimposed on stage 4 chronic kidney disease ([place]) 01/07/2021   Diarrhea    Acute hypokalemia 01/06/2021   Long-term use of immunosuppressant medication-anticipated 11/28/2020   Elevated blood-pressure reading, without diagnosis of hypertension 05/30/2020   Meningioma ([place]) 05/30/2020   Headache 05/17/2020   Hypokalemia 05/17/2020   Hypomagnesemia 05/17/2020   Dehydration 05/17/2020   Hypertensive urgency 05/17/2020   Visual disturbance 05/17/2020   Left hip pain 09/22/2019   Lumbar radiculopathy 09/22/2019   Postoperative anemia due to acute blood loss 09/18/2019   Postoperative urinary retention 09/18/2019   Closed fracture of distal end of radius 05/26/2019   Coronary arteriosclerosis 07/15/2018   Closed right hip fracture ([place]) 04/08/2018   Elevated lipase    Crohn's disease of small intestine without complication ([place])    Protein-calorie malnutrition, severe 03/05/2018   Neck pain 03/12/2017   Acute kidney injury superimposed on CKD ([place]) 01/25/2016   Neuropathy 03/02/2015   Pain in the chest    Chest pain  12/22/2014   Essential hypertension 12/22/2014   Nausea & vomiting 12/22/2014   Uncontrolled type 2 diabetes mellitus with hyperglycemia, with long-term current use of insulin ([place]) 12/22/2014   Hiatal hernia 12/22/2014   GERD (gastroesophageal reflux disease) 12/22/2014   Gout 12/22/2014   Mixed hyperlipidemia 12/22/2014   Depression 12/22/2014   Type 2 diabetes mellitus without complications ([place]) 47/42/5956   Fusion of spine, cervicothoracic region 11/01/2014   Pseudophakia, left eye 10/20/2014   Chronic kidney disease (CKD), stage IV (severe) ([place]) 08/25/2014   Cervical stenosis of spine 08/26/2013   Vitamin B12 deficiency 07/08/2011   Personal history of failed moderate sedation 04/16/2011   Chronic Nausea and Vomiting - ? gastroapresis 04/16/2011   Early satiety 04/16/2011   Irritable bowel syndrome 04/16/2011   Benign paroxysmal positional vertigo 11/20/2010   TRICUSPID REGURGITATION, MODERATE 11/20/2010   PULMONARY HYPERTENSION, MILD 11/20/2010   LEG EDEMA, BILATERAL 11/20/2010   DYSPNEA 11/20/2010   NONSPECIFIC ABNORMAL ELECTROCARDIOGRAM 11/20/2010   CHEST WALL PAIN, HX OF 11/20/2010   PCP:  [name], MD Pharmacy:   [facility] [place] [address] [place] [address] [address] Phone: [phone] Fax: [place] [address] [place] [address] Phone: [phone] Fax: [phone]     Social Determinants  of Health (SDOH) Social History: SDOH Screenings   Food Insecurity: No Food Insecurity (10/16/2022)  Housing: Low Risk  (10/16/2022)  Transportation Needs: No Transportation Needs (10/16/2022)  Utilities: Not At Risk (10/16/2022)  Tobacco Use: Low Risk  (10/15/2022)   SDOH Interventions:     Readmission Risk Interventions    10/18/2022   12:42 PM 07/05/2022   11:42 AM 06/20/2022    2:55 PM  Readmission Risk Prevention Plan  Transportation Screening  Complete Complete Complete  Medication Review Press photographer) Complete Complete Complete  PCP or Specialist appointment within 3-5 days of discharge   Not Complete  HRI or [place] Complete Complete   SW Recovery Care/Counseling Consult Complete Complete Complete  Palliative Care Screening Not Applicable Not Applicable Not [place] Complete Not Applicable Not Applicable

## 2022-10-18 NOTE — Progress Notes (Signed)
PROGRESS NOTE  Heather [name] IWO:[id number] DOB: December 17, 1950 DOA: 10/15/2022 PCP: [name], MD  Brief History:  71 y/o female with history of diabetes mellitus type 2, hyperlipidemia, dCHF, Crohn's disease, CKD stage 4, B12 deficiency, anxiety disorder, stroke, HTN, chronic pancreatitis, mixed hyperlipidemia presenting with another fall.  She denies syncope.  Apparently, the patient fell 2 days prior to admission in the bathroom and hit the side of her head.  She she has been complaining of pain in her head and right shoulder since then.  The patient has had a history of frequent falls in the past 12 months.  She has chronic right leg weakness which seems to have started in late January 2023 after a fall.  She has been complaining of low back pain secondary to her falls.  She denies any syncope.  She was most recently admitted from 07/04/2022 to 07/07/2022 with acute on chronic pancreatitis, acute on chronic renal failure, and falls.  In addition, she had another hospitalization from 06/20/2022 to 06/22/2022 with acute on chronic pancreatitis, acute on chronic renal failure, and falls.  She has had a known right humerus fracture for which she is following [name].  She endorses dietary indiscretion, but states that she has been taking her insulin. In the ED, the patient was afebrile and hemodynamically stable.  WBC 16.0, hemoglobin 8.0, platelets 204,000.  Sodium 125, potassium 6.3, bicarbonate 13, serum creatinine 3.37, serum glucose of 434.  Assessment/Plan: DKA type II -The patient presented with serum glucose up to 480 with anion gap 16 -Improved with IV fluids and subcutaneous insulin -Patient endorses dietary indiscretion -06/20/2022 hemoglobin A1c 10.0 -A1c> 15.5 -Continue IV fluids  Acute on chronic renal failure--CKD stage IV -Baseline creatinine 2.3-2.6 -Presented with serum creatinine 3.37 -Secondary to volume depletion and hemodynamic changes -With IV fluids,  creatinine has been up to 3.15, BUN still 72 -Renal ultrasound without hydronephrosis -Will continue with IV fluids for now  Hyperkalemia -Continue Lokelma -Continue IV fluids  Chronic diastolic CHF -Currently compensated -lower dose metoprolol succinate temporarily secondary to soft blood pressure -Monitor volume status while on IV fluids  Mixed hyperlipidemia -Continue statin  Essential hypertension -Holding hydralazine secondary to soft blood pressures -start metoprolol succinate at lower dose -Blood pressure currently stable  Crohn's disease -No active symptoms presently -Outpatient GI follow-up  Uncontrolled diabetes mellitus type 2 with hyperglycemia -A1c> 15.5 -NovoLog sliding scale -Patient has had brittle diabetes with episodes of hypoglycemia -Decrease Levemir to 10 units -Blood sugars have been stable in the hospital  Anxiety/depression -Continue Effexor, Geodon  Right Humerus fracture -Xray in ED shows no change when compared to 07/24/22 -Follow-up with orthopedics  Left patella fracture -Previously seen by orthopedics and was placed in knee immobilizer -Nonweightbearing left leg -Follow-up with orthopedics  Low back pain -Reports that this began after a fall -Reports that pain is rating down her legs -MRI lumbar spine without any concerning findings  Anemia, likely related to chronic kidney disease -No reported history of bleeding -Check FOBT -B12 853, folate 10.0 -Hemoglobin is down to 7 today she is still very symptomatic with dizziness and lightheadedness on standing -Will transfuse 1 unit PRBC  Generalized weakness Fall prior to admission -PT recommended skilled nursing facility    Family Communication: Updated husband over the phone 12/20  Consultants:  none  Code Status:  FULL  DVT Prophylaxis:  [place] Heparin    Procedures: As Listed in Progress Note Above  Antibiotics: None  RN Pressure Injury Documentation: Pressure Injury  08/26/21 Buttocks Right;Left Stage 1 -  Intact skin with non-blanchable redness of a localized area usually over a bony prominence. (Active)  08/26/21 0050  Location: Buttocks  Location Orientation: Right;Left  Staging: Stage 1 -  Intact skin with non-blanchable redness of a localized area usually over a bony prominence.  Wound Description (Comments):   Present on Admission: Yes     Pressure Injury 03/16/22 Buttocks Left;Medial Stage 2 -  Partial thickness loss of dermis presenting as a shallow open injury with a red, pink wound bed without slough. pink wound bed, perimeter reddened and purple (Active)  03/16/22 0015  Location: Buttocks  Location Orientation: Left;Medial  Staging: Stage 2 -  Partial thickness loss of dermis presenting as a shallow open injury with a red, pink wound bed without slough.  Wound Description (Comments): pink wound bed, perimeter reddened and purple  Present on Admission: Yes  Dressing Type Foam - Lift dressing to assess site every shift 10/18/22 0738     Pressure Injury 06/21/22 Coccyx Right Stage 1 -  Intact skin with non-blanchable redness of a localized area usually over a bony prominence. red circular 0.5cm pressure injury directly on boney sacrum (Active)  06/21/22 1000  Location: Coccyx  Location Orientation: Right  Staging: Stage 1 -  Intact skin with non-blanchable redness of a localized area usually over a bony prominence.  Wound Description (Comments): red circular 0.5cm pressure injury directly on boney sacrum  Present on Admission:         Subjective: She says she feels unwell today.  Still feels weak and dizzy on standing.  Objective: Vitals:   10/18/22 0828 10/18/22 1314 10/18/22 1723 10/18/22 1800  BP: (!) 162/61 (!) 112/57 (!) 141/67 (!) 169/77  Pulse: 75 65 78 75  Resp:   16 16  Temp:  (!) 97.4 F (36.3 C) 97.7 F (36.5 C) 97.9 F (36.6 C)  TempSrc:  Oral Oral Oral  SpO2:  100% 99% 100%  Weight:      Height:         Intake/Output Summary (Last 24 hours) at 10/18/2022 2029 Last data filed at 10/18/2022 1900 Gross per 24 hour  Intake 960 ml  Output --  Net 960 ml   Weight change: 1.2 kg Exam:  General:  Pt is alert, follows commands appropriately, not in acute distress HEENT: No icterus, No thrush, No neck mass, [place]/AT Cardiovascular: RRR, S1/S2, no rubs, no gallops Respiratory: Bibasilar crackles.  No wheezing.  Good air movement Abdomen: Soft/+BS, non tender, non distended, no guarding Extremities: No edema, No lymphangitis, No petechiae, No rashes, no synovitis    Data Reviewed: I have personally reviewed following labs and imaging studies Basic Metabolic Panel: Recent Labs  Lab 10/15/22 1609 10/15/22 1944 10/16/22 0502 10/17/22 0419 10/18/22 0423  NA 124* 125* 130* 130* 130*  K 6.1* 6.3* 5.0 4.8 5.9*  CL 94* 96* 102 104 104  CO2 18* 13* 19* 18* 18*  GLUCOSE 480* 434* 88 152* 136*  BUN 77* 77* 77* 73* 72*  CREATININE 3.27* 3.37* 3.17* 3.06* 3.15*  CALCIUM 9.6 9.3 9.0 8.5* 8.2*  MG  --  2.1  --  1.9  --    Liver Function Tests: Recent Labs  Lab 10/16/22 0502  AST 16  ALT 13  ALKPHOS 84  BILITOT 0.5  PROT 5.8*  ALBUMIN 2.6*   No results for input(s): "LIPASE", "AMYLASE" in the last 168 hours. No  results for input(s): "AMMONIA" in the last 168 hours. Coagulation Profile: No results for input(s): "INR", "PROTIME" in the last 168 hours. CBC: Recent Labs  Lab 10/15/22 1609 10/16/22 0502 10/17/22 0419 Nov 01, 2022 0423  WBC 19.9* 16.0* 10.1 7.8  NEUTROABS 17.7*  --   --   --   HGB 10.0* 8.0* 7.4* 7.0*  HCT 30.3* 24.9* 23.0* 22.4*  MCV 92.7 94.7 94.3 96.6  PLT 296 184 169 140*   Cardiac Enzymes: Recent Labs  Lab 2022/11/01 0846  CKTOTAL 49   BNP: Invalid input(s): "POCBNP" CBG: Recent Labs  Lab 10/17/22 2204 11/01/2022 0743 11-01-22 1222 2022-11-01 1419 2022-11-01 1638  GLUCAP 175* 91 68* 130* 259*   HbA1C: Recent Labs    10/16/22 0919 10/17/22 0419   HGBA1C >15.5* >15.5*   Urine analysis:    Component Value Date/Time   COLORURINE YELLOW 10/16/2022 1413   APPEARANCEUR HAZY (A) 10/16/2022 1413   LABSPEC 1.011 10/16/2022 1413   PHURINE 5.0 10/16/2022 1413   GLUCOSEU >=500 (A) 10/16/2022 1413   GLUCOSEU NEGATIVE 03/28/2018 1224   HGBUR NEGATIVE 10/16/2022 [place] 10/16/2022 1413   KETONESUR NEGATIVE 10/16/2022 1413   PROTEINUR >=300 (A) 10/16/2022 1413   UROBILINOGEN 1.0 03/28/2018 1224   NITRITE NEGATIVE 10/16/2022 1413   LEUKOCYTESUR LARGE (A) 10/16/2022 1413   Sepsis Labs: '@LABRCNTIP'$ (procalcitonin:4,lacticidven:4) ) Recent Results (from the past 240 hour(s))  Urine Culture     Status: None   Collection Time: 10/16/22  2:13 PM   Specimen: Urine, Clean Catch  Result Value Ref Range Status   Specimen Description   Final    URINE, CLEAN CATCH Performed at [facility], [address] [place], [place] [address]    Special Requests   Final    NONE Performed at [facility], [address] [place], [place] [address]    Culture   Final    NO GROWTH Performed at [facility], [place] [address], [place], [place] [address]    Report Status 10/17/2022 FINAL  Final     Scheduled Meds:  acetaminophen  1,000 mg Oral Q6H   atorvastatin  80 mg Oral Daily   dicyclomine  20 mg Oral BID   gabapentin  300 mg Oral QHS   heparin  5,000 Units Subcutaneous Q8H   insulin aspart  0-5 Units Subcutaneous QHS   insulin aspart  0-9 Units Subcutaneous TID WC   insulin detemir  10 Units Subcutaneous QHS   levETIRAcetam  500 mg Oral BID   lipase/protease/amylase  36,000 Units Oral TID AC   metoprolol succinate  25 mg Oral Daily   pantoprazole  40 mg Oral Daily   sodium zirconium cyclosilicate  10 g Oral Daily   tiZANidine  4 mg Oral BID   venlafaxine XR  150 mg Oral Daily   ziprasidone  20 mg Oral BID   Continuous Infusions:  sodium chloride 100 mL/hr at 11-01-2022 1254    Procedures/Studies: US  RENAL  Result Date: 01-Nov-2022 CLINICAL DATA:  Acute kidney injury EXAM: RENAL / URINARY TRACT ULTRASOUND COMPLETE COMPARISON:  CT 07/04/2022 FINDINGS: Right Kidney: Renal measurements: 10.2 x 5.3 x 5.0 cm = volume: 142.0 mL. No hydronephrosis. Increased renal cortical echogenicity. Left Kidney: Renal measurements: 10.8 x 5.4 x 5.3 cm = volume: 160.8 mL. No hydronephrosis. Increased renal cortical echogenicity. Bladder: Appears normal for degree of bladder distention. Other: None. IMPRESSION: No hydronephrosis. Increased renal cortical echogenicity, as can be seen in medical renal disease. Electronically Signed  By: Maurine Simmering M.D.   On: 10/18/2022 13:12   MR LUMBAR SPINE WO CONTRAST  Result Date: 10/17/2022 CLINICAL DATA:  Low back pain after a fall. EXAM: MRI LUMBAR SPINE WITHOUT CONTRAST TECHNIQUE: Multiplanar, multisequence MR imaging of the lumbar spine was performed. No intravenous contrast was administered. COMPARISON:  MRI lumbar spine dated June 20, 2022. FINDINGS: Segmentation:  Standard. Alignment:  Unchanged 3 mm retrolisthesis at L1-L2. Vertebrae: Chronic T12 superior endplate compression fracture continues to demonstrate fluid within the fracture cleft and associated marrow edema, without progressive height loss. Unchanged 3 mm retropulsion of the posterosuperior endplate. No new fracture. Prior L1-L4 fusion. No evidence of discitis or suspicious bone lesion. Conus medullaris and cauda equina: Conus extends to the T12-L1 level. Conus and cauda equina appear normal. Paraspinal and other soft tissues: Negative. Disc levels: T11-T12: Unchanged minimal disc bulging and mild bilateral facet arthropathy. Unchanged mild right neuroforaminal stenosis. No spinal canal or left neuroforaminal stenosis. T12-L1:  Unchanged minimal disc bulging.  No stenosis. L1-L2: Prior posterior fusion. No significant disc bulge or herniation. No stenosis. L2-L3:  Prior PLIF.  No residual stenosis. L3-L4:  Prior PLIF.   No residual stenosis. L4-L5: Unchanged small broad-based posterior disc protrusion and moderate bilateral facet arthropathy. Unchanged mild to moderate left neuroforaminal stenosis. No spinal canal or right neuroforaminal stenosis. L5-S1: Negative disc. Unchanged mild-to-moderate bilateral facet arthropathy. No stenosis. IMPRESSION: 1. Chronic T12 superior endplate compression fracture with persistent marrow edema but no progressive height loss. No new fracture. 2. Prior L1-L4 fusion without residual stenosis. 3. Unchanged mild-to-moderate left neuroforaminal stenosis at L4-L5. Electronically Signed   By: Titus Dubin M.D.   On: 10/17/2022 14:58   CT Head Wo Contrast  Result Date: 10/15/2022 CLINICAL DATA:  Head trauma EXAM: CT HEAD WITHOUT CONTRAST TECHNIQUE: Contiguous axial images were obtained from the base of the skull through the vertex without intravenous contrast. RADIATION DOSE REDUCTION: This exam was performed according to the departmental dose-optimization program which includes automated exposure control, adjustment of the mA and/or kV according to patient size and/or use of iterative reconstruction technique. COMPARISON:  CT head 08/22/2022.  MRI brain 08/23/2022. FINDINGS: Brain: No evidence of acute infarction, hemorrhage, hydrocephalus, extra-axial collection or mass lesion/mass effect. There is mild increased asymmetric hyperdensity in the left lentiform. There is no surrounding edema or mass effect. This is a new finding. This is area not convincing for acute hemorrhage. Vascular: Atherosclerotic calcifications are present within the cavernous internal carotid arteries. Skull: Normal. Negative for fracture or focal lesion. Sinuses/Orbits: No acute finding. Other: There is soft tissue swelling overlying the posterior right parietal region. IMPRESSION: 1. No acute intracranial hemorrhage. 2. Mild increased asymmetric hyperdensity in the left lentiform. Findings are indeterminate, but can be  seen with nonketotic hyperglycemic hemichorea. 3. Soft tissue swelling overlying the posterior right parietal region. Electronically Signed   By: Ronney Asters M.D.   On: 10/15/2022 17:40   CT Cervical Spine Wo Contrast  Result Date: 10/15/2022 CLINICAL DATA:  Trauma EXAM: CT CERVICAL SPINE WITHOUT CONTRAST TECHNIQUE: Multidetector CT imaging of the cervical spine was performed without intravenous contrast. Multiplanar CT image reconstructions were also generated. RADIATION DOSE REDUCTION: This exam was performed according to the departmental dose-optimization program which includes automated exposure control, adjustment of the mA and/or kV according to patient size and/or use of iterative reconstruction technique. COMPARISON:  08/20/2022 FINDINGS: Alignment: Postoperative straightening of the normal cervical lordosis. Skull base and vertebrae: No acute fracture. No primary bone lesion or  focal pathologic process. Soft tissues and spinal canal: No prevertebral fluid or swelling. No visible canal hematoma. Disc levels: Extensive multilevel discectomy and fusion from C3 through T1. Upper chest: Negative. Other: None. IMPRESSION: 1. No fracture or static subluxation of the cervical spine. 2. Extensive multilevel discectomy and fusion from C3 through T1. Electronically Signed   By: Delanna Ahmadi M.D.   On: 10/15/2022 17:31   DG Shoulder Right  Result Date: 10/15/2022 CLINICAL DATA:  Pelvis morning.  Possible history of fracture. EXAM: RIGHT SHOULDER - 2+ VIEW; RIGHT HUMERUS - 2+ VIEW COMPARISON:  Right shoulder radiographs 03/28/2022, 07/24/2022; FINDINGS: There is diffuse decreased bone mineralization. Redemonstration of comminuted, impacted now subacute to chronic fracture of the proximal right humerus including the inferior aspect of the humeral head and the surgical neck. There is high-grade (approximately 2.7 cm craniocaudal dimension) impaction of the humeral shaft into the inferior aspect of the humeral  head. High-grade bone loss of the inferior aspect of the humeral head. Approximately 1.4 cm anterior displacement of the humeral shaft on lateral view, unchanged. Moderate glenohumeral joint space narrowing. Old unchanged bone fragments overlying the inferior glenohumeral joint space. Remote healed fracture of the mid to lateral right clavicular shaft with associated clavicle shortening, unchanged. No acute fracture is seen.  No dislocation. Partial visualization of lower cervical spine anterior and posterior fusion hardware. IMPRESSION: Redemonstration of comminuted, impacted now subacute to chronic fracture of the proximal right humerus including the inferior aspect of the humeral head and the surgical neck. There is high-grade impaction of the humeral shaft into the inferior aspect of the humeral head. No significant change from 07/24/2022. Electronically Signed   By: Yvonne Kendall M.D.   On: 10/15/2022 17:11   DG Humerus Right  Result Date: 10/15/2022 CLINICAL DATA:  Pelvis morning.  Possible history of fracture. EXAM: RIGHT SHOULDER - 2+ VIEW; RIGHT HUMERUS - 2+ VIEW COMPARISON:  Right shoulder radiographs 03/28/2022, 07/24/2022; FINDINGS: There is diffuse decreased bone mineralization. Redemonstration of comminuted, impacted now subacute to chronic fracture of the proximal right humerus including the inferior aspect of the humeral head and the surgical neck. There is high-grade (approximately 2.7 cm craniocaudal dimension) impaction of the humeral shaft into the inferior aspect of the humeral head. High-grade bone loss of the inferior aspect of the humeral head. Approximately 1.4 cm anterior displacement of the humeral shaft on lateral view, unchanged. Moderate glenohumeral joint space narrowing. Old unchanged bone fragments overlying the inferior glenohumeral joint space. Remote healed fracture of the mid to lateral right clavicular shaft with associated clavicle shortening, unchanged. No acute fracture  is seen.  No dislocation. Partial visualization of lower cervical spine anterior and posterior fusion hardware. IMPRESSION: Redemonstration of comminuted, impacted now subacute to chronic fracture of the proximal right humerus including the inferior aspect of the humeral head and the surgical neck. There is high-grade impaction of the humeral shaft into the inferior aspect of the humeral head. No significant change from 07/24/2022. Electronically Signed   By: Yvonne Kendall M.D.   On: 10/15/2022 17:11    Kathie Dike, MD  [facility]  If 7PM-7AM, please contact night-coverage [email or web address]  10/18/2022, 8:29 PM   LOS: 1 day

## 2022-10-18 NOTE — NC FL2 (Signed)
[place] LEVEL OF CARE FORM     IDENTIFICATION  Patient Name: Heather [name] Birthdate: 1951/03/27 Sex: female Admission Date (Current Location): 10/15/2022  [facility] and [place] Number:  [employer] and Address:  [facility] [address] [place]      Provider Number: [id number]  Attending Physician Name and Address:  Kathie Dike, MD  Relative Name and Phone Number:       Current Level of Care: Hospital Recommended Level of Care: [place] Prior Approval Number:    Date Approved/Denied:   PASRR Number:    Discharge Plan: SNF    Current Diagnoses: Patient Active Problem List   Diagnosis Date Noted   AKI (acute kidney injury) ([place]) 10/17/2022   DKA, type 2 ([place]) 10/16/2022   History of right shoulder fracture 10/15/2022   Aortic atherosclerosis ([place]) 08/01/2022   Multiple falls 07/04/2022   Humeral head fracture 07/04/2022   Right leg weakness 06/20/2022   Anxiety 06/20/2022   UTI (urinary tract infection) 06/20/2022   Malnutrition of moderate degree 06/20/2022   Hyperkalemia 06/19/2022   Type 2 diabetes mellitus with hyperosmolar nonketotic hyperglycemia ([place]) 05/27/2022   Acute kidney failure ([place]) 03/17/2022   Mixed diabetic hyperlipidemia associated with type 2 diabetes mellitus ([place]) 03/16/2022   Chronic diastolic CHF (congestive heart failure) ([place]) 03/16/2022   Decubitus ulcer of left buttock, stage 2 ([place]) 03/16/2022   Cellulitis of left hand 11/30/2021   Closed fracture of part of upper end of humerus 11/30/2021   At risk for falls 11/28/2021   Neoplasm of meninges 11/28/2021   Sepsis ([place]) 11/28/2021   Acute on chronic anemia 11/28/2021   Cerebral thrombosis with cerebral infarction 08/28/2021   Cerebrovascular accident (CVA) due to embolism of cerebral artery ([place])    Pressure injury of skin 08/26/2021   Acute respiratory failure with hypoxia ([place]) 70/35/0093   Acute  metabolic encephalopathy 81/82/9937   Malignant hypertension 08/25/2021   Thrombocytopenia ([place]) 08/25/2021   Anemia due to chronic kidney disease 08/25/2021   Blurry vision, bilateral 08/16/2021   Hyponatremia 04/01/2021   COVID-19 03/30/2021   Senile osteoporosis 02/10/2021   Macrocytic anemia    E coli enteritis 01/09/2021   Acute pancreatitis 01/08/2021   Acute renal failure superimposed on stage 4 chronic kidney disease ([place]) 01/07/2021   Diarrhea    Acute hypokalemia 01/06/2021   Long-term use of immunosuppressant medication-anticipated 11/28/2020   Elevated blood-pressure reading, without diagnosis of hypertension 05/30/2020   Meningioma ([place]) 05/30/2020   Headache 05/17/2020   Hypokalemia 05/17/2020   Hypomagnesemia 05/17/2020   Dehydration 05/17/2020   Hypertensive urgency 05/17/2020   Visual disturbance 05/17/2020   Left hip pain 09/22/2019   Lumbar radiculopathy 09/22/2019   Postoperative anemia due to acute blood loss 09/18/2019   Postoperative urinary retention 09/18/2019   Closed fracture of distal end of radius 05/26/2019   Coronary arteriosclerosis 07/15/2018   Closed right hip fracture ([place]) 04/08/2018   Elevated lipase    Crohn's disease of small intestine without complication (DISH)    Protein-calorie malnutrition, severe 03/05/2018   Neck pain 03/12/2017   Acute kidney injury superimposed on CKD ([place]) 01/25/2016   Neuropathy 03/02/2015   Pain in the chest    Chest pain 12/22/2014   Essential hypertension 12/22/2014   Nausea & vomiting 12/22/2014   Uncontrolled type 2 diabetes mellitus with hyperglycemia, with long-term current use of insulin ([place]) 12/22/2014   Hiatal hernia 12/22/2014   GERD (gastroesophageal  reflux disease) 12/22/2014   Gout 12/22/2014   Mixed hyperlipidemia 12/22/2014   Depression 12/22/2014   Type 2 diabetes mellitus without complications ([place]) 96/28/3662   Fusion of spine, cervicothoracic region 11/01/2014   Pseudophakia, left  eye 10/20/2014   Chronic kidney disease (CKD), stage IV (severe) (HCC) 08/25/2014   Cervical stenosis of spine 08/26/2013   Vitamin B12 deficiency 07/08/2011   Personal history of failed moderate sedation 04/16/2011   Chronic Nausea and Vomiting - ? gastroapresis 04/16/2011   Early satiety 04/16/2011   Irritable bowel syndrome 04/16/2011   Benign paroxysmal positional vertigo 11/20/2010   TRICUSPID REGURGITATION, MODERATE 11/20/2010   PULMONARY HYPERTENSION, MILD 11/20/2010   LEG EDEMA, BILATERAL 11/20/2010   DYSPNEA 11/20/2010   NONSPECIFIC ABNORMAL ELECTROCARDIOGRAM 11/20/2010   CHEST WALL PAIN, HX OF 11/20/2010    Orientation RESPIRATION BLADDER Height & Weight     Self, Place, Situation  Normal Incontinent Weight: 128 lb 4.9 oz (58.2 kg) Height:  '5\' 4"'$  (162.6 cm)  BEHAVIORAL SYMPTOMS/MOOD NEUROLOGICAL BOWEL NUTRITION STATUS      Continent Diet (see dc summary)  AMBULATORY STATUS COMMUNICATION OF NEEDS Skin   Extensive Assist Verbally Normal                       Personal Care Assistance Level of Assistance  Bathing, Feeding, Dressing Bathing Assistance: Limited assistance Feeding assistance: Limited assistance Dressing Assistance: Limited assistance     Functional Limitations Info  Sight, Hearing, Speech Sight Info: Impaired Hearing Info: Impaired Speech Info: Adequate    SPECIAL CARE FACTORS FREQUENCY  PT (By licensed PT), OT (By licensed OT)     PT Frequency: 5x week OT Frequency: 3x week            Contractures Contractures Info: Not present    Additional Factors Info  Code Status, Allergies, Psychotropic Code Status Info: Full Allergies Info: Cozaar (Losartan), Quinolones, Cymbalta (Duloxetine Hcl), Sulfa Antibiotics, Synalar (Fluocinolone), Cipro (Ciprofloxacin Hcl), Diovan (Valsartan), Glucophage (Metformin), Ketalar (Ketamine), Norco (Hydrocodone-acetaminophen), Norvasc (Amlodipine Besylate), Nsaids, Tape, Zestril (Lisinopril) Psychotropic  Info: Buspar, Effexor, Geodon         Current Medications (10/18/2022):  This is the current hospital active medication list Current Facility-Administered Medications  Medication Dose Route Frequency Provider Last Rate Last Admin   0.9 %  sodium chloride infusion   Intravenous Continuous Darliss Cheney, MD 100 mL/hr at 10/18/22 1254 New Bag at 10/18/22 1254   acetaminophen (TYLENOL) tablet 1,000 mg  1,000 mg Oral Q6H Tat, Shanon Brow, MD   1,000 mg at 10/18/22 0814   atorvastatin (LIPITOR) tablet 80 mg  80 mg Oral Daily Darliss Cheney, MD   80 mg at 10/18/22 0813   dicyclomine (BENTYL) capsule 20 mg  20 mg Oral BID Tat, Shanon Brow, MD   20 mg at 10/18/22 0819   gabapentin (NEURONTIN) capsule 300 mg  300 mg Oral QHS Pahwani, Einar Grad, MD   300 mg at 10/17/22 2215   heparin injection 5,000 Units  5,000 Units Subcutaneous Q8H Darliss Cheney, MD   5,000 Units at 10/18/22 1258   HYDROmorphone (DILAUDID) injection 0.5 mg  0.5 mg Intravenous Q4H PRN Neena Rhymes, MD   0.5 mg at 10/18/22 9476   HYDROmorphone (DILAUDID) tablet 1 mg  1 mg Oral Q4H PRN Neena Rhymes, MD   1 mg at 10/17/22 2214   insulin aspart (novoLOG) injection 0-5 Units  0-5 Units Subcutaneous QHS Tat, Shanon Brow, MD       insulin aspart (novoLOG) injection  0-9 Units  0-9 Units Subcutaneous TID WC Orson Eva, MD   1 Units at 10/16/22 1812   insulin detemir (LEVEMIR) injection 10 Units  10 Units Subcutaneous QHS Orson Eva, MD   10 Units at 10/17/22 2214   levETIRAcetam (KEPPRA) tablet 500 mg  500 mg Oral BID Darliss Cheney, MD   500 mg at 10/18/22 0818   lipase/protease/amylase (CREON) capsule 36,000 Units  36,000 Units Oral TID York Spaniel, MD   36,000 Units at 10/18/22 1247   metoprolol succinate (TOPROL-XL) 24 hr tablet 25 mg  25 mg Oral Daily Tat, David, MD   25 mg at 10/18/22 0829   ondansetron (ZOFRAN) injection 4 mg  4 mg Intravenous Q6H PRN Tat, Shanon Brow, MD       ondansetron [facility]) tablet 4 mg  4 mg Oral Q8H PRN Kathie Dike, MD    4 mg at 10/17/22 1947   oxyCODONE-acetaminophen (PERCOCET/ROXICET) 5-325 MG per tablet 1 tablet  1 tablet Oral Q6H PRN Kathie Dike, MD   1 tablet at 10/18/22 0652   pantoprazole (PROTONIX) EC tablet 40 mg  40 mg Oral Daily Darliss Cheney, MD   40 mg at 10/18/22 4854   sodium zirconium cyclosilicate (LOKELMA) packet 10 g  10 g Oral Daily Kathie Dike, MD   10 g at 10/18/22 1016   tiZANidine (ZANAFLEX) tablet 4 mg  4 mg Oral BID Darliss Cheney, MD   4 mg at 10/18/22 0818   venlafaxine XR (EFFEXOR-XR) 24 hr capsule 150 mg  150 mg Oral Daily Darliss Cheney, MD   150 mg at 10/18/22 0818   ziprasidone (GEODON) capsule 20 mg  20 mg Oral BID Darliss Cheney, MD   20 mg at 10/18/22 6270     Discharge Medications: Please see discharge summary for a list of discharge medications.  Relevant Imaging Results:  Relevant Lab Results:   Additional Information SSN: [id number] [place]

## 2022-10-18 NOTE — Plan of Care (Signed)
  Problem: Acute Rehab PT Goals(only PT should resolve) Goal: Pt Will Go Supine/Side To Sit Outcome: Progressing Flowsheets (Taken 10/18/2022 0940) Pt will go Supine/Side to Sit: with min guard assist Goal: Pt Will Go Sit To Supine/Side Outcome: Progressing Flowsheets (Taken 10/18/2022 0940) Pt will go Sit to Supine/Side: with min guard assist Goal: Patient Will Transfer Sit To/From Stand Outcome: Progressing Flowsheets (Taken 10/18/2022 0940) Patient will transfer sit to/from stand: with minimal assist Goal: Pt Will Transfer Bed To Chair/Chair To Bed Outcome: Progressing Flowsheets (Taken 10/18/2022 0940) Pt will Transfer Bed to Chair/Chair to Bed: with min assist Goal: Pt Will Ambulate Outcome: Progressing Flowsheets (Taken 10/18/2022 0940) Pt will Ambulate:  10 feet  with minimal assist  with moderate assist  with rolling walker Goal: Pt Will Verbalize and Adhere to Precautions While Description: PT Will Verbalize and Adhere to Precautions While Performing Mobility Outcome: Progressing Flowsheets (Taken 10/18/2022 0940) Pt will verbalize and adhere to precautions while performing mobility: (NWB LLE) -- Goal: Pt/caregiver will Perform Home Exercise Program Outcome: Progressing Flowsheets (Taken 10/18/2022 0940) Pt/caregiver will Perform Home Exercise Program:  For increased strengthening  For improved balance  Independently  9:42 AM, 10/18/22 Mearl Latin PT, DPT Physical Therapist at [facility]

## 2022-10-18 NOTE — Progress Notes (Signed)
Glucose was 68 before lunch.  Ate lunch and given coke and now 130

## 2022-10-18 NOTE — Evaluation (Signed)
Physical Therapy Evaluation Patient Details Name: Heather [name] MRN: [id number] DOB: 1951-06-22 Today's Date: 10/18/2022  History of Present Illness  Heather [name] is a 71 y.o. female with medical history significant of uncontrolled type 2 diabetes mellitus with hyperglycemia, CKD stage IV, anxiety, hypertension and many other comorbidities presented to ED with a complaint of frequent falls.  According to patient, she initially fell 2 days ago in the bathroom, hit her head to the side of the table.  She did not lose consciousness.  No history of prodromal symptoms such as dizziness.  Per ED physician, the patient came to the ED because she has been hurting in the head since then and shoulder pain on the right side has also gotten worse so she came to the ED however when I asked her the reason to come to the ED now and not 2 days ago, she told me that she has been falling and has fallen 2 more times since then but she has not hit her head since then.  Upon further questioning, she tells me that the falls are usually secondary to generalized weakness, her legs are bending up.  No other complaint.  Denies any fever, chills, sweating, nausea, vomiting, any problem with urination or bowel movement or any shortness of breath.   Clinical Impression  Patient limited for functional mobility as stated below secondary to weakness, fatigue, pain, and poor standing balance. Patient with very limited use of R UE due to R humeral fracture and pain requiring assist to pull to seated with LUE. She demonstrates good sitting tolerance and balance at EOB. Patient with L patellar fracture and is supposed to be NWB with immobilizer but her immobilizer is at home. She likely as been unable to maintain NWB on LLE due to inability to use RUE with mobility. Patient given cueing for weightbearing status with transfer to standing and ambulation to chair but she is unable to maintain due to weakness, balance deficits and  limited RUE use. Patient requiring assist with transfer and ambulation and ends session seated in chair with extra padding under R buttock to off load present wound. Patient will benefit from continued physical therapy in hospital and recommended venue below to increase strength, balance, endurance for safe ADLs and gait.        Recommendations for follow up therapy are one component of a multi-disciplinary discharge planning process, led by the attending physician.  Recommendations may be updated based on patient status, additional functional criteria and insurance authorization.  Follow Up Recommendations Skilled nursing-short term rehab (<3 hours/day) Can patient physically be transported by private vehicle: Yes    Assistance Recommended at Discharge Intermittent Supervision/Assistance  Patient can return home with the following  A lot of help with walking and/or transfers;A lot of help with bathing/dressing/bathroom    Equipment Recommendations None recommended by PT  Recommendations for Other Services       Functional Status Assessment Patient has had a recent decline in their functional status and demonstrates the ability to make significant improvements in function in a reasonable and predictable amount of time.     Precautions / Restrictions Precautions Precautions: Fall Precaution Comments: patient is supposed to be using L knee immobilizer but she has it at home Restrictions Weight Bearing Restrictions: Yes LLE Weight Bearing: Non weight bearing      Mobility  Bed Mobility Overal bed mobility: Needs Assistance Bed Mobility: Supine to Sit     Supine to sit: Min assist, HOB elevated  General bed mobility comments: assist to pull to seated EOB    Transfers Overall transfer level: Needs assistance Equipment used: Rolling walker (2 wheels) Transfers: Sit to/from Stand, Bed to chair/wheelchair/BSC Sit to Stand: Mod assist   Step pivot transfers: Mod assist        General transfer comment: cueing for precautions on LLE with poor carry over, assist to power up to standing with RW    Ambulation/Gait Ambulation/Gait assistance: Min assist, Mod assist Gait Distance (Feet): 3 Feet Assistive device: Rolling walker (2 wheels) Gait Pattern/deviations: Decreased stride length Gait velocity: decreased     General Gait Details: cueing for NWB on LLE with poor carry over, takes several small steps to chair  Stairs            Wheelchair Mobility    Modified Rankin (Stroke Patients Only)       Balance Overall balance assessment: Needs assistance   Sitting balance-Leahy Scale: Good Sitting balance - Comments: seated EOB   Standing balance support: Reliant on assistive device for balance, Single extremity supported Standing balance-Leahy Scale: Fair Standing balance comment: poor/fair with LUE on RW                             Pertinent Vitals/Pain Pain Assessment Pain Assessment: Faces Faces Pain Scale: Hurts even more Pain Location: R shoulder, L knee, and neck Pain Descriptors / Indicators: Grimacing, Guarding Pain Intervention(s): Limited activity within patient's tolerance, Monitored during session, Repositioned, RN gave pain meds during session    [place] expects to be discharged to:: Private residence Living Arrangements: Spouse/significant other Available Help at Discharge: Family;Available 24 hours/day Type of Home: House Home Access: Ramped entrance       Home Layout: One level Home Equipment: Rollator (4 wheels);Crutches;Tub bench;Grab bars - tub/shower;Rolling Walker (2 wheels);Cane - quad;Cane - single point      Prior Function Prior Level of Function : Needs assist             Mobility Comments: patient states she has mostly been staying in bed since recent injuries, ambulates to bathroom with use of cane ADLs Comments: family assists     Hand Dominance         Extremity/Trunk Assessment   Upper Extremity Assessment Upper Extremity Assessment: Defer to OT evaluation    Lower Extremity Assessment Lower Extremity Assessment: Generalized weakness;LLE deficits/detail LLE: Unable to fully assess due to immobilization       Communication   Communication: No difficulties  Cognition Arousal/Alertness: Awake/alert Behavior During Therapy: WFL for tasks assessed/performed Overall Cognitive Status: Within Functional Limits for tasks assessed                                          General Comments      Exercises     Assessment/Plan    PT Assessment Patient needs continued PT services  PT Problem List Decreased strength;Decreased mobility;Decreased activity tolerance;Decreased balance;Pain       PT Treatment Interventions DME instruction;Therapeutic exercise;Gait training;Balance training;Stair training;Neuromuscular re-education;Functional mobility training;Therapeutic activities;Patient/family education    PT Goals (Current goals can be found in the Care Plan section)  Acute Rehab PT Goals Patient Stated Goal: arm to stop hurting PT Goal Formulation: With patient Time For Goal Achievement: 11/01/22 Potential to Achieve Goals: Fair    Frequency Min 3X/week  Co-evaluation               AM-PAC PT "6 Clicks" Mobility  Outcome Measure Help needed turning from your back to your side while in a flat bed without using bedrails?: A Little Help needed moving from lying on your back to sitting on the side of a flat bed without using bedrails?: A Lot Help needed moving to and from a bed to a chair (including a wheelchair)?: A Lot Help needed standing up from a chair using your arms (e.g., wheelchair or bedside chair)?: A Lot Help needed to walk in hospital room?: A Lot Help needed climbing 3-5 steps with a railing? : A Lot 6 Click Score: 13    End of Session Equipment Utilized During Treatment: Gait  belt Activity Tolerance: Patient limited by pain;Patient limited by fatigue Patient left: in chair;with call bell/phone within reach Nurse Communication: Mobility status;Weight bearing status PT Visit Diagnosis: Unsteadiness on feet (R26.81);Other abnormalities of gait and mobility (R26.89);Muscle weakness (generalized) (M62.81);Repeated falls (R29.6)    Time: 5188-4166 PT Time Calculation (min) (ACUTE ONLY): 25 min   Charges:   PT Evaluation $PT Eval Low Complexity: 1 Low PT Treatments $Therapeutic Activity: 8-22 mins        9:38 AM, 10/18/22 Mearl Latin PT, DPT Physical Therapist at [facility]

## 2022-10-18 NOTE — Progress Notes (Signed)
Answers questions to be alert and oriented x 4 but then stated that her Dr. Was just on the TV (she was watching a Tom Hanks movie.) She just asked if the person was still in the bathroom and when asked who she was talking about said,.  "You know the one that was laying in the bed with me."  Has complained of right shoulder and left knee pain.  Sitting up in chair now

## 2022-10-19 DIAGNOSIS — I10 Essential (primary) hypertension: Secondary | ICD-10-CM | POA: Diagnosis not present

## 2022-10-19 DIAGNOSIS — E1165 Type 2 diabetes mellitus with hyperglycemia: Secondary | ICD-10-CM | POA: Diagnosis not present

## 2022-10-19 DIAGNOSIS — N184 Chronic kidney disease, stage 4 (severe): Secondary | ICD-10-CM | POA: Diagnosis not present

## 2022-10-19 DIAGNOSIS — N179 Acute kidney failure, unspecified: Secondary | ICD-10-CM | POA: Diagnosis not present

## 2022-10-19 LAB — BASIC METABOLIC PANEL
Anion gap: 10 (ref 5–15)
BUN: 73 mg/dL — ABNORMAL HIGH (ref 8–23)
CO2: 15 mmol/L — ABNORMAL LOW (ref 22–32)
Calcium: 7.9 mg/dL — ABNORMAL LOW (ref 8.9–10.3)
Chloride: 108 mmol/L (ref 98–111)
Creatinine, Ser: 3.22 mg/dL — ABNORMAL HIGH (ref 0.44–1.00)
GFR, Estimated: 15 mL/min — ABNORMAL LOW (ref >60)
Glucose, Bld: 46 mg/dL — ABNORMAL LOW (ref 70–99)
Potassium: 5.4 mmol/L — ABNORMAL HIGH (ref 3.5–5.1)
Sodium: 133 mmol/L — ABNORMAL LOW (ref 135–145)

## 2022-10-19 LAB — TYPE AND SCREEN
ABO/RH(D): O POS
Antibody Screen: NEGATIVE
Unit division: 0

## 2022-10-19 LAB — CBC
HCT: 25.9 % — ABNORMAL LOW (ref 36.0–46.0)
Hemoglobin: 8 g/dL — ABNORMAL LOW (ref 12.0–15.0)
MCH: 29.2 pg (ref 26.0–34.0)
MCHC: 30.9 g/dL (ref 30.0–36.0)
MCV: 94.5 fL (ref 80.0–100.0)
Platelets: 134 10*3/uL — ABNORMAL LOW (ref 150–400)
RBC: 2.74 MIL/uL — ABNORMAL LOW (ref 3.87–5.11)
RDW: 14.4 % (ref 11.5–15.5)
WBC: 7.4 10*3/uL (ref 4.0–10.5)
nRBC: 0 % (ref 0.0–0.2)

## 2022-10-19 LAB — BPAM RBC
Blood Product Expiration Date: 202401052359
ISSUE DATE / TIME: 202312211740
Unit Type and Rh: 5100

## 2022-10-19 LAB — GLUCOSE, CAPILLARY
Glucose-Capillary: 49 mg/dL — ABNORMAL LOW (ref 70–99)
Glucose-Capillary: 80 mg/dL (ref 70–99)
Glucose-Capillary: 101 mg/dL — ABNORMAL HIGH (ref 70–99)
Glucose-Capillary: 171 mg/dL — ABNORMAL HIGH (ref 70–99)
Glucose-Capillary: 160 mg/dL — ABNORMAL HIGH (ref 70–99)
Glucose-Capillary: 196 mg/dL — ABNORMAL HIGH (ref 70–99)

## 2022-10-19 MED ORDER — SODIUM CHLORIDE 0.45 % IV SOLN
INTRAVENOUS | Status: DC
  Filled 2022-10-19 (×8): qty 75

## 2022-10-19 MED ORDER — TIZANIDINE HCL 4 MG PO TABS: 4.0000 mg | ORAL_TABLET | Freq: Three times a day (TID) | ORAL | Status: DC | PRN

## 2022-10-19 MED ORDER — DEXTROSE 50 % IV SOLN
INTRAVENOUS | Status: AC
  Filled 2022-10-19: qty 50

## 2022-10-19 NOTE — Care Management Important Message (Signed)
Important Message  Patient Details  Name: Heather [name] MRN: [id number] Date of Birth: 03/06/1951   Medicare Important Message Given:  Other (see comment) (unable to reach by phone, copy  mailed to address on file due to contact precautions)     Tommy Medal 10/19/2022, 9:35 AM

## 2022-10-19 NOTE — Progress Notes (Addendum)
Patients CBG is 49 following the hypoglycemic order set, MD aware.  Patiens CBG is now 80.

## 2022-10-19 NOTE — TOC Progression Note (Signed)
Transition of Care [facility]) - Progression Note    Patient Details  Name: Heather [name] MRN: [id number] Date of Birth: 12-07-1950  Transition of Care [facility]) CM/SW [place], [place] Phone Number: 10/19/2022, 3:24 PM  Clinical Narrative:    CSW spoke with pt to go over pts bed offers. Pts husband would like to accept bed offer from [facility]. CSW updated HUB to reflect this. Insurance auth to be started closer to when pt is medically stable. TOC to follow.    Barriers to Discharge: Continued Medical Work up  Expected Discharge Plan and Services In-house Referral: Clinical Social Work   Post Acute Care Choice: [place] Living arrangements for the past 2 months: Mobile Home                                       Social Determinants of Health (SDOH) Interventions [place]: No Food Insecurity (10/16/2022)  Housing: Low Risk  (10/16/2022)  Transportation Needs: No Transportation Needs (10/16/2022)  Utilities: Not At Risk (10/16/2022)  Tobacco Use: Low Risk  (10/15/2022)    Readmission Risk Interventions    10/18/2022   12:42 PM 07/05/2022   11:42 AM 06/20/2022    2:55 PM  Readmission Risk Prevention Plan  Transportation Screening Complete Complete Complete  Medication Review Press photographer) Complete Complete Complete  PCP or Specialist appointment within 3-5 days of discharge   Not Complete  HRI or [place] Complete Complete   SW Recovery Care/Counseling Consult Complete Complete Complete  Palliative Care Screening Not Applicable Not Applicable Not [place] Complete Not Applicable Not Applicable

## 2022-10-19 NOTE — Consult Note (Signed)
   [facility] Inpatient Consult   10/19/2022  ARELIE KUZEL 1951/10/19 [id number]  [place] Organization [ACO] Patient: [facility] Liaison remote coverage review for patient admitted to [facility]    Primary Care Provider:  [name], MD with [facility] Internal Medicine which is listed to provide the transition of care follow up.  Patient screened for hospitalization with noted extreme high risk score for unplanned readmission risk and to assess for potential [place] [facility] service needs for post hospital transition for care coordination.  Review of patient's electronic medical record and encounters reveals patient had declined [facility] Coordination offered in the past. Patient with noted 4 hospital admissions in the past 6 months with 3 ED visits.   Plan:  Continue to follow progress and disposition to assess for post hospital community care coordination/management needs.  Referral request for community care coordination:  declines [facility] previously, continue to follow  Of note, [place] does not replace or interfere with any arrangements made by the Inpatient Transition of Care team.  For questions contact:   Natividad Brood, RN BSN [place]  [phone] business mobile phone Toll free office [phone]  *[place]  [phone] Fax number: [phone] [email or web address]

## 2022-10-19 NOTE — Plan of Care (Signed)
  Problem: Acute Rehab OT Goals (only OT should resolve) Goal: Pt. Will Perform Grooming Flowsheets (Taken 10/19/2022 1048) Pt Will Perform Grooming:  Independently  sitting Goal: Pt. Will Perform Upper Body Bathing Flowsheets (Taken 10/19/2022 1048) Pt Will Perform Upper Body Bathing:  with supervision  sitting Goal: Pt. Will Perform Lower Body Bathing Flowsheets (Taken 10/19/2022 1048) Pt Will Perform Lower Body Bathing:  with min assist  sitting/lateral leans  with adaptive equipment Goal: Pt. Will Perform Upper Body Dressing Flowsheets (Taken 10/19/2022 1048) Pt Will Perform Upper Body Dressing:  with supervision  sitting Goal: Pt. Will Perform Lower Body Dressing Flowsheets (Taken 10/19/2022 1048) Pt Will Perform Lower Body Dressing:  with min assist  with adaptive equipment  sitting/lateral leans Goal: Pt. Will Transfer To Toilet Flowsheets (Taken 10/19/2022 1048) Pt Will Transfer to Toilet:  with min guard assist  stand pivot transfer Goal: Pt. Will Perform Toileting-Clothing Manipulation Flowsheets (Taken 10/19/2022 1048) Pt Will Perform Toileting - Clothing Manipulation and hygiene:  with min guard assist  sitting/lateral leans Goal: Pt/Caregiver Will Perform Home Exercise Program Flowsheets (Taken 10/19/2022 1048) Pt/caregiver will Perform Home Exercise Program:  Increased ROM  Increased strength  Right Upper extremity  Left upper extremity  With Supervision  Alla Sloma OT, MOT

## 2022-10-19 NOTE — Evaluation (Signed)
Occupational Therapy Evaluation Patient Details Name: Heather [name] MRN: [id number] DOB: [birth year] Today's Date: 10/19/2022   History of Present Illness Heather [name] is a 71 y.o. female with medical history significant of uncontrolled type 2 diabetes mellitus with hyperglycemia, CKD stage IV, anxiety, hypertension and many other comorbidities presented to ED with a complaint of frequent falls.  According to patient, she initially fell 2 days ago in the bathroom, hit her head to the side of the table.  She did not lose consciousness.  No history of prodromal symptoms such as dizziness.  Per ED physician, the patient came to the ED because she has been hurting in the head since then and shoulder pain on the right side has also gotten worse so she came to the ED however when I asked her the reason to come to the ED now and not 2 days ago, she told me that she has been falling and has fallen 2 more times since then but she has not hit her head since then.  Upon further questioning, she tells me that the falls are usually secondary to generalized weakness, her legs are bending up.  No other complaint.  Denies any fever, chills, sweating, nausea, vomiting, any problem with urination or bowel movement or any shortness of breath.   Clinical Impression   Pt agreeable to OT evaluation. Pt is limited with R UE movement due to history of shoulder fracture limiting active and passive ROM. Pt needed min A to pull to sit with poor sitting balance and difficulty maintaining upright position at EOB. Mod A to transfer to chair with poor carry over of NWB status on L LE. Pt was also slightly confused and not fully oriented today. Pt was left in chair with call bell within reach and chair alarm set. Pt will benefit from continued OT in the hospital and recommended venue below to increase strength, balance, and endurance for safe ADL's.         Recommendations for follow up therapy are one component of a  multi-disciplinary discharge planning process, led by the attending physician.  Recommendations may be updated based on patient status, additional functional criteria and insurance authorization.   Follow Up Recommendations  Skilled nursing-short term rehab (<3 hours/day)     Assistance Recommended at Discharge Frequent or constant Supervision/Assistance  Patient can return home with the following A lot of help with walking and/or transfers;A lot of help with bathing/dressing/bathroom;Assistance with cooking/housework;Assist for transportation;Help with stairs or ramp for entrance    Functional Status Assessment  Patient has had a recent decline in their functional status and demonstrates the ability to make significant improvements in function in a reasonable and predictable amount of time.  Equipment Recommendations  None recommended by OT    Recommendations for Other Services       Precautions / Restrictions Precautions Precautions: Fall Restrictions Weight Bearing Restrictions: Yes LLE Weight Bearing: Non weight bearing      Mobility Bed Mobility Overal bed mobility: Needs Assistance Bed Mobility: Supine to Sit     Supine to sit: Min assist, HOB elevated     General bed mobility comments: assist to pull to seated EOB    Transfers Overall transfer level: Needs assistance Equipment used: Rolling walker (2 wheels) Transfers: Sit to/from Stand, Bed to chair/wheelchair/BSC Sit to Stand: Mod assist     Step pivot transfers: Mod assist     General transfer comment: cueing for precautions on LLE with poor carry over, assist to  boost from EOB.      Balance Overall balance assessment: Needs assistance Sitting-balance support: No upper extremity supported, Feet supported Sitting balance-Leahy Scale: Poor Sitting balance - Comments: seated EOB Postural control: Posterior lean Standing balance support: Reliant on assistive device for balance, Bilateral upper extremity  supported, During functional activity Standing balance-Leahy Scale: Fair Standing balance comment: poor/fair using RW                           ADL either performed or assessed with clinical judgement   ADL Overall ADL's : Needs assistance/impaired     Grooming: Applying deodorant;Supervision/safety;Sitting Grooming Details (indicate cue type and reason): Pt able to apply deodorant seated in chair without physical assist. Upper Body Bathing: Minimal assistance;Moderate assistance;Sitting   Lower Body Bathing: Maximal assistance;Sitting/lateral leans   Upper Body Dressing : Minimal assistance;Moderate assistance;Sitting   Lower Body Dressing: Maximal assistance;Sitting/lateral leans Lower Body Dressing Details (indicate cue type and reason): Pt attempted to don socks but struggled and required assist. Poor balance at EOB was a limiting factor. Toilet Transfer: Moderate assistance;Rolling walker (2 wheels);Stand-pivot Armed forces technical officer Details (indicate cue type and reason): Simulated via EOB to chair. Toileting- Clothing Manipulation and Hygiene: Maximal assistance;Sitting/lateral lean       Functional mobility during ADLs: Moderate assistance       Vision Baseline Vision/History: 1 Wears glasses Ability to See in Adequate Light: 1 Impaired Patient Visual Report: Other (comment) (Pt visual report was difficult to understand. Pt reported blurry vision but was unclear on how severe or how long.) Vision Assessment?: No apparent visual deficits Additional Comments: Per observation of functional tasks. Pt able to read nurses name on white board.                Pertinent Vitals/Pain Pain Assessment Pain Assessment: 0-10 Pain Score: 10-Worst pain ever Pain Location: R hip/thigh Pain Descriptors / Indicators: Grimacing Pain Intervention(s): Limited activity within patient's tolerance, Monitored during session, Repositioned     Hand Dominance Right    Extremity/Trunk Assessment Upper Extremity Assessment Upper Extremity Assessment: RUE deficits/detail;LUE deficits/detail RUE Deficits / Details: History of R shoulder fracture. Limited to 2+/5 shoulder flexion P/ROM and A/ROM. Generally weak below the shoulder. LUE Deficits / Details: Generally weak.   Lower Extremity Assessment Lower Extremity Assessment: Defer to PT evaluation   Cervical / Trunk Assessment Cervical / Trunk Assessment: Normal   Communication Communication Communication: No difficulties   Cognition Arousal/Alertness: Awake/alert Behavior During Therapy: WFL for tasks assessed/performed Overall Cognitive Status: No family/caregiver present to determine baseline cognitive functioning                                 General Comments: Pt not oriented to month, year, or place this morning.                      Home Living Family/patient expects to be discharged to:: Private residence Living Arrangements: Spouse/significant other Available Help at Discharge: Family;Available 24 hours/day Type of Home: House Home Access: Ramped entrance     Home Layout: One level     Bathroom Shower/Tub: Teacher, early years/pre: Handicapped height Bathroom Accessibility: Yes How Accessible: Accessible via walker Home Equipment: Rollator (4 wheels);Crutches;Tub bench;Grab bars - tub/shower;Rolling Walker (2 wheels);Cane - quad;Cane - single point   Additional Comments: Per previous documentation.      Prior Functioning/Environment Prior  Level of Function : Needs assist       Physical Assist : Mobility (physical);ADLs (physical) Mobility (physical): Gait;Bed mobility;Transfers;Stairs ADLs (physical): Bathing;Dressing;IADLs Mobility Comments: patient states she has mostly been staying in bed since recent injuries, ambulates to bathroom with use of cane (per PT) ADLs Comments: Assited for bathing and dressing. Reported that she fell while  trying to use the toilet. Assisted for IADL's.        OT Problem List: Decreased strength;Decreased range of motion;Decreased activity tolerance;Impaired balance (sitting and/or standing);Impaired vision/perception;Impaired UE functional use      OT Treatment/Interventions: Self-care/ADL training;Therapeutic exercise;Therapeutic activities;Patient/family education;Balance training;Visual/perceptual remediation/compensation    OT Goals(Current goals can be found in the care plan section) Acute Rehab OT Goals Patient Stated Goal: Get stronger at rehab. OT Goal Formulation: With patient Time For Goal Achievement: 11/02/22 Potential to Achieve Goals: Fair  OT Frequency: Min 2X/week                                   End of Session Equipment Utilized During Treatment: Rolling walker (2 wheels)  Activity Tolerance: Patient tolerated treatment well Patient left: in chair;with call bell/phone within reach;with chair alarm set  OT Visit Diagnosis: Unsteadiness on feet (R26.81);Other abnormalities of gait and mobility (R26.89);Muscle weakness (generalized) (M62.81)                Time: 9509-3267 OT Time Calculation (min): 20 min Charges:  OT General Charges $OT Visit: 1 Visit OT Evaluation $OT Eval Low Complexity: 1 Low  Lonnie Reth OT, MOT   Larey Seat 10/19/2022, 10:46 AM

## 2022-10-19 NOTE — Progress Notes (Signed)
PROGRESS NOTE  Heather [name] TKZ:[id number] DOB: 11-05-1950 DOA: 10/15/2022 PCP: [name], MD  Brief History:  71 y/o female with history of diabetes mellitus type 2, hyperlipidemia, dCHF, Crohn's disease, CKD stage 4, B12 deficiency, anxiety disorder, stroke, HTN, chronic pancreatitis, mixed hyperlipidemia presenting with another fall.  She denies syncope.  Apparently, the patient fell 2 days prior to admission in the bathroom and hit the side of her head.  She she has been complaining of pain in her head and right shoulder since then.  The patient has had a history of frequent falls in the past 12 months.  She has chronic right leg weakness which seems to have started in late January 2023 after a fall.  She has been complaining of low back pain secondary to her falls.  She denies any syncope.  She was most recently admitted from 07/04/2022 to 07/07/2022 with acute on chronic pancreatitis, acute on chronic renal failure, and falls.  In addition, she had another hospitalization from 06/20/2022 to 06/22/2022 with acute on chronic pancreatitis, acute on chronic renal failure, and falls.  She has had a known right humerus fracture for which she is following [name].  She endorses dietary indiscretion, but states that she has been taking her insulin. In the ED, the patient was afebrile and hemodynamically stable.  WBC 16.0, hemoglobin 8.0, platelets 204,000.  Sodium 125, potassium 6.3, bicarbonate 13, serum creatinine 3.37, serum glucose of 434.  Assessment/Plan: DKA type II -The patient presented with serum glucose up to 480 with anion gap 16 -Improved with IV fluids and subcutaneous insulin -Patient endorses dietary indiscretion -06/20/2022 hemoglobin A1c 10.0 -A1c> 15.5 -Continue IV fluids  Acute on chronic renal failure--CKD stage IV -Baseline creatinine 2.6-3.0 -Presented with serum creatinine 3.37 -Secondary to volume depletion and hemodynamic changes -ARB held on  admission -Renal ultrasound without hydronephrosis -Currently, creatinine 3.2 which is not too far off her baseline -Difficult to assess urine output since intake and output has not been accurately recorded -Have asked staff to place pure wick catheter to better assess urine output  Hyperkalemia -Continue Lokelma -Continue IV fluids  Chronic diastolic CHF -Currently compensated -lower dose metoprolol succinate temporarily secondary to soft blood pressure -Monitor volume status while on IV fluids  Mixed hyperlipidemia -Continue statin  Essential hypertension -Holding hydralazine secondary to soft blood pressures -start metoprolol succinate at lower dose -Blood pressure currently stable  Crohn's disease -No active symptoms presently -Outpatient GI follow-up  Uncontrolled diabetes mellitus type 2 with hyperglycemia -A1c> 15.5 -NovoLog sliding scale -Patient has had brittle diabetes with episodes of hypoglycemia -She was on minimal dose of Levemir, but still had hypoglycemia episodes -Hold further basal insulin for now and monitor blood sugars on sliding scale  Anxiety/depression -Continue Effexor, Geodon  Right Humerus fracture -Xray in ED shows no change when compared to 07/24/22 -Follow-up with orthopedics  Left patella fracture -Previously seen by orthopedics and was placed in knee immobilizer -Nonweightbearing left leg -Follow-up with orthopedics  Low back pain -Reports that this began after a fall -Reports that pain is rating down her legs -MRI lumbar spine without any concerning findings  Anemia, likely related to chronic kidney disease -No reported history of bleeding -Check FOBT -B12 853, folate 10.0 -Hemoglobin trended down to 7.0 -She was transfused 1 unit PRBC with improvement in hemoglobin to 8.0 -Continue to monitor  Normal anion gap metabolic acidosis -Likely related to renal failure -Will start on bicarbonate infusion  Generalized  weakness Fall prior to admission -PT recommended skilled nursing facility    Family Communication: Updated husband at bedside 12/22  Consultants:  none  Code Status:  FULL  DVT Prophylaxis:  [place] Heparin    Procedures: As Listed in Progress Note Above  Antibiotics: None  RN Pressure Injury Documentation: Pressure Injury 08/26/21 Buttocks Right;Left Stage 1 -  Intact skin with non-blanchable redness of a localized area usually over a bony prominence. (Active)  08/26/21 0050  Location: Buttocks  Location Orientation: Right;Left  Staging: Stage 1 -  Intact skin with non-blanchable redness of a localized area usually over a bony prominence.  Wound Description (Comments):   Present on Admission: Yes     Pressure Injury 03/16/22 Buttocks Left;Medial Stage 2 -  Partial thickness loss of dermis presenting as a shallow open injury with a red, pink wound bed without slough. pink wound bed, perimeter reddened and purple (Active)  03/16/22 0015  Location: Buttocks  Location Orientation: Left;Medial  Staging: Stage 2 -  Partial thickness loss of dermis presenting as a shallow open injury with a red, pink wound bed without slough.  Wound Description (Comments): pink wound bed, perimeter reddened and purple  Present on Admission: Yes  Dressing Type Foam - Lift dressing to assess site every shift 10/18/22 1943     Pressure Injury 06/21/22 Coccyx Right Stage 1 -  Intact skin with non-blanchable redness of a localized area usually over a bony prominence. red circular 0.5cm pressure injury directly on boney sacrum (Active)  06/21/22 1000  Location: Coccyx  Location Orientation: Right  Staging: Stage 1 -  Intact skin with non-blanchable redness of a localized area usually over a bony prominence.  Wound Description (Comments): red circular 0.5cm pressure injury directly on boney sacrum  Present on Admission:         Subjective: Feels dizzy and weak on standing.  Reports good urine  output, although I's and O's not accurately recorded.  Denies any bloody stools.  No diarrhea or vomiting.  Objective: Vitals:   10/18/22 2008 10/19/22 0411 10/19/22 0500 10/19/22 1415  BP: (!) 161/67 (!) 185/64  (!) 137/56  Pulse: 72 92  67  Resp: '18 16  18  '$ Temp: 98.3 F (36.8 C) 97.7 F (36.5 C)  97.6 F (36.4 C)  TempSrc: Oral Axillary  Oral  SpO2: 100% 100%  99%  Weight:   58.3 kg   Height:        Intake/Output Summary (Last 24 hours) at 10/19/2022 1806 Last data filed at 10/19/2022 1746 Gross per 24 hour  Intake 1366 ml  Output --  Net 1366 ml   Weight change: 0.087 kg Exam:  General:  Pt is alert, follows commands appropriately, not in acute distress HEENT: No icterus, No thrush, No neck mass, Crum/AT Cardiovascular: RRR, S1/S2, no rubs, no gallops Respiratory: Bibasilar crackles.  No wheezing.  Good air movement Abdomen: Soft/+BS, non tender, non distended, no guarding Extremities: No edema, No lymphangitis, No petechiae, No rashes, no synovitis    Data Reviewed: I have personally reviewed following labs and imaging studies Basic Metabolic Panel: Recent Labs  Lab 10/15/22 1944 10/16/22 0502 10/17/22 0419 10/18/22 0423 10/19/22 0358  NA 125* 130* 130* 130* 133*  K 6.3* 5.0 4.8 5.9* 5.4*  CL 96* 102 104 104 108  CO2 13* 19* 18* 18* 15*  GLUCOSE 434* 88 152* 136* 46*  BUN 77* 77* 73* 72* 73*  CREATININE 3.37* 3.17* 3.06* 3.15* 3.22*  CALCIUM 9.3  9.0 8.5* 8.2* 7.9*  MG 2.1  --  1.9  --   --    Liver Function Tests: Recent Labs  Lab 10/16/22 0502  AST 16  ALT 13  ALKPHOS 84  BILITOT 0.5  PROT 5.8*  ALBUMIN 2.6*   No results for input(s): "LIPASE", "AMYLASE" in the last 168 hours. No results for input(s): "AMMONIA" in the last 168 hours. Coagulation Profile: No results for input(s): "INR", "PROTIME" in the last 168 hours. CBC: Recent Labs  Lab 10/15/22 1609 10/16/22 0502 10/17/22 0419 10/18/22 0423 10/19/22 0358  WBC 19.9* 16.0* 10.1 7.8  7.4  NEUTROABS 17.7*  --   --   --   --   HGB 10.0* 8.0* 7.4* 7.0* 8.0*  HCT 30.3* 24.9* 23.0* 22.4* 25.9*  MCV 92.7 94.7 94.3 96.6 94.5  PLT 296 184 169 140* 134*   Cardiac Enzymes: Recent Labs  Lab 10/18/22 0846  CKTOTAL 49   BNP: Invalid input(s): "POCBNP" CBG: Recent Labs  Lab 10/19/22 0612 10/19/22 0644 10/19/22 0802 10/19/22 1150 10/19/22 1701  GLUCAP 49* 80 101* 171* 160*   HbA1C: Recent Labs    10/17/22 0419  HGBA1C >15.5*   Urine analysis:    Component Value Date/Time   COLORURINE YELLOW 10/16/2022 1413   APPEARANCEUR HAZY (A) 10/16/2022 1413   LABSPEC 1.011 10/16/2022 1413   PHURINE 5.0 10/16/2022 1413   GLUCOSEU >=500 (A) 10/16/2022 1413   GLUCOSEU NEGATIVE 03/28/2018 [place] 10/16/2022 [place] 10/16/2022 1413   KETONESUR NEGATIVE 10/16/2022 1413   PROTEINUR >=300 (A) 10/16/2022 1413   UROBILINOGEN 1.0 03/28/2018 1224   NITRITE NEGATIVE 10/16/2022 1413   LEUKOCYTESUR LARGE (A) 10/16/2022 1413   Sepsis Labs: '@LABRCNTIP'$ (procalcitonin:4,lacticidven:4) ) Recent Results (from the past 240 hour(s))  Urine Culture     Status: None   Collection Time: 10/16/22  2:13 PM   Specimen: Urine, Clean Catch  Result Value Ref Range Status   Specimen Description   Final    URINE, CLEAN CATCH Performed at [facility], [address] [place], [place] [address]    Special Requests   Final    NONE Performed at [facility], [address], [place], [place] [address]    Culture   Final    NO GROWTH Performed at [facility], [place] [address], [place], [place] [address]    Report Status 10/17/2022 FINAL  Final     Scheduled Meds:  acetaminophen  1,000 mg Oral Q6H   atorvastatin  80 mg Oral Daily   heparin  5,000 Units Subcutaneous Q8H   insulin aspart  0-5 Units Subcutaneous QHS   insulin aspart  0-9 Units Subcutaneous TID WC   levETIRAcetam  500 mg Oral BID   lipase/protease/amylase  36,000 Units Oral TID AC    metoprolol succinate  25 mg Oral Daily   pantoprazole  40 mg Oral Daily   sodium zirconium cyclosilicate  10 g Oral Daily   venlafaxine XR  150 mg Oral Daily   ziprasidone  20 mg Oral BID   Continuous Infusions:  sodium bicarbonate 75 mEq in sodium chloride 0.45 % 1,075 mL infusion 100 mL/hr at 10/19/22 1452    Procedures/Studies: US RENAL  Result Date: 10/18/2022 CLINICAL DATA:  Acute kidney injury EXAM: RENAL / URINARY TRACT ULTRASOUND COMPLETE COMPARISON:  CT 07/04/2022 FINDINGS: Right Kidney: Renal measurements: 10.2 x 5.3 x 5.0 cm = volume: 142.0 mL. No hydronephrosis. Increased renal cortical echogenicity. Left Kidney: Renal  measurements: 10.8 x 5.4 x 5.3 cm = volume: 160.8 mL. No hydronephrosis. Increased renal cortical echogenicity. Bladder: Appears normal for degree of bladder distention. Other: None. IMPRESSION: No hydronephrosis. Increased renal cortical echogenicity, as can be seen in medical renal disease. Electronically Signed   By: Maurine Simmering M.D.   On: 10/18/2022 13:12   MR LUMBAR SPINE WO CONTRAST  Result Date: 10/17/2022 CLINICAL DATA:  Low back pain after a fall. EXAM: MRI LUMBAR SPINE WITHOUT CONTRAST TECHNIQUE: Multiplanar, multisequence MR imaging of the lumbar spine was performed. No intravenous contrast was administered. COMPARISON:  MRI lumbar spine dated June 20, 2022. FINDINGS: Segmentation:  Standard. Alignment:  Unchanged 3 mm retrolisthesis at L1-L2. Vertebrae: Chronic T12 superior endplate compression fracture continues to demonstrate fluid within the fracture cleft and associated marrow edema, without progressive height loss. Unchanged 3 mm retropulsion of the posterosuperior endplate. No new fracture. Prior L1-L4 fusion. No evidence of discitis or suspicious bone lesion. Conus medullaris and cauda equina: Conus extends to the T12-L1 level. Conus and cauda equina appear normal. Paraspinal and other soft tissues: Negative. Disc levels: T11-T12: Unchanged minimal  disc bulging and mild bilateral facet arthropathy. Unchanged mild right neuroforaminal stenosis. No spinal canal or left neuroforaminal stenosis. T12-L1:  Unchanged minimal disc bulging.  No stenosis. L1-L2: Prior posterior fusion. No significant disc bulge or herniation. No stenosis. L2-L3:  Prior PLIF.  No residual stenosis. L3-L4:  Prior PLIF.  No residual stenosis. L4-L5: Unchanged small broad-based posterior disc protrusion and moderate bilateral facet arthropathy. Unchanged mild to moderate left neuroforaminal stenosis. No spinal canal or right neuroforaminal stenosis. L5-S1: Negative disc. Unchanged mild-to-moderate bilateral facet arthropathy. No stenosis. IMPRESSION: 1. Chronic T12 superior endplate compression fracture with persistent marrow edema but no progressive height loss. No new fracture. 2. Prior L1-L4 fusion without residual stenosis. 3. Unchanged mild-to-moderate left neuroforaminal stenosis at L4-L5. Electronically Signed   By: Titus Dubin M.D.   On: 10/17/2022 14:58   CT Head Wo Contrast  Result Date: 10/15/2022 CLINICAL DATA:  Head trauma EXAM: CT HEAD WITHOUT CONTRAST TECHNIQUE: Contiguous axial images were obtained from the base of the skull through the vertex without intravenous contrast. RADIATION DOSE REDUCTION: This exam was performed according to the departmental dose-optimization program which includes automated exposure control, adjustment of the mA and/or kV according to patient size and/or use of iterative reconstruction technique. COMPARISON:  CT head 08/22/2022.  MRI brain 08/23/2022. FINDINGS: Brain: No evidence of acute infarction, hemorrhage, hydrocephalus, extra-axial collection or mass lesion/mass effect. There is mild increased asymmetric hyperdensity in the left lentiform. There is no surrounding edema or mass effect. This is a new finding. This is area not convincing for acute hemorrhage. Vascular: Atherosclerotic calcifications are present within the cavernous  internal carotid arteries. Skull: Normal. Negative for fracture or focal lesion. Sinuses/Orbits: No acute finding. Other: There is soft tissue swelling overlying the posterior right parietal region. IMPRESSION: 1. No acute intracranial hemorrhage. 2. Mild increased asymmetric hyperdensity in the left lentiform. Findings are indeterminate, but can be seen with nonketotic hyperglycemic hemichorea. 3. Soft tissue swelling overlying the posterior right parietal region. Electronically Signed   By: Ronney Asters M.D.   On: 10/15/2022 17:40   CT Cervical Spine Wo Contrast  Result Date: 10/15/2022 CLINICAL DATA:  Trauma EXAM: CT CERVICAL SPINE WITHOUT CONTRAST TECHNIQUE: Multidetector CT imaging of the cervical spine was performed without intravenous contrast. Multiplanar CT image reconstructions were also generated. RADIATION DOSE REDUCTION: This exam was performed according to the departmental dose-optimization  program which includes automated exposure control, adjustment of the mA and/or kV according to patient size and/or use of iterative reconstruction technique. COMPARISON:  08/20/2022 FINDINGS: Alignment: Postoperative straightening of the normal cervical lordosis. Skull base and vertebrae: No acute fracture. No primary bone lesion or focal pathologic process. Soft tissues and spinal canal: No prevertebral fluid or swelling. No visible canal hematoma. Disc levels: Extensive multilevel discectomy and fusion from C3 through T1. Upper chest: Negative. Other: None. IMPRESSION: 1. No fracture or static subluxation of the cervical spine. 2. Extensive multilevel discectomy and fusion from C3 through T1. Electronically Signed   By: Delanna Ahmadi M.D.   On: 10/15/2022 17:31   DG Shoulder Right  Result Date: 10/15/2022 CLINICAL DATA:  Pelvis morning.  Possible history of fracture. EXAM: RIGHT SHOULDER - 2+ VIEW; RIGHT HUMERUS - 2+ VIEW COMPARISON:  Right shoulder radiographs 03/28/2022, 07/24/2022; FINDINGS: There is  diffuse decreased bone mineralization. Redemonstration of comminuted, impacted now subacute to chronic fracture of the proximal right humerus including the inferior aspect of the humeral head and the surgical neck. There is high-grade (approximately 2.7 cm craniocaudal dimension) impaction of the humeral shaft into the inferior aspect of the humeral head. High-grade bone loss of the inferior aspect of the humeral head. Approximately 1.4 cm anterior displacement of the humeral shaft on lateral view, unchanged. Moderate glenohumeral joint space narrowing. Old unchanged bone fragments overlying the inferior glenohumeral joint space. Remote healed fracture of the mid to lateral right clavicular shaft with associated clavicle shortening, unchanged. No acute fracture is seen.  No dislocation. Partial visualization of lower cervical spine anterior and posterior fusion hardware. IMPRESSION: Redemonstration of comminuted, impacted now subacute to chronic fracture of the proximal right humerus including the inferior aspect of the humeral head and the surgical neck. There is high-grade impaction of the humeral shaft into the inferior aspect of the humeral head. No significant change from 07/24/2022. Electronically Signed   By: Yvonne Kendall M.D.   On: 10/15/2022 17:11   DG Humerus Right  Result Date: 10/15/2022 CLINICAL DATA:  Pelvis morning.  Possible history of fracture. EXAM: RIGHT SHOULDER - 2+ VIEW; RIGHT HUMERUS - 2+ VIEW COMPARISON:  Right shoulder radiographs 03/28/2022, 07/24/2022; FINDINGS: There is diffuse decreased bone mineralization. Redemonstration of comminuted, impacted now subacute to chronic fracture of the proximal right humerus including the inferior aspect of the humeral head and the surgical neck. There is high-grade (approximately 2.7 cm craniocaudal dimension) impaction of the humeral shaft into the inferior aspect of the humeral head. High-grade bone loss of the inferior aspect of the humeral  head. Approximately 1.4 cm anterior displacement of the humeral shaft on lateral view, unchanged. Moderate glenohumeral joint space narrowing. Old unchanged bone fragments overlying the inferior glenohumeral joint space. Remote healed fracture of the mid to lateral right clavicular shaft with associated clavicle shortening, unchanged. No acute fracture is seen.  No dislocation. Partial visualization of lower cervical spine anterior and posterior fusion hardware. IMPRESSION: Redemonstration of comminuted, impacted now subacute to chronic fracture of the proximal right humerus including the inferior aspect of the humeral head and the surgical neck. There is high-grade impaction of the humeral shaft into the inferior aspect of the humeral head. No significant change from 07/24/2022. Electronically Signed   By: Yvonne Kendall M.D.   On: 10/15/2022 17:11    Kathie Dike, MD  [facility]  If 7PM-7AM, please contact night-coverage [email or web address]  10/19/2022, 6:06 PM   LOS: 2 days

## 2022-10-20 DIAGNOSIS — N179 Acute kidney failure, unspecified: Secondary | ICD-10-CM | POA: Diagnosis not present

## 2022-10-20 DIAGNOSIS — N184 Chronic kidney disease, stage 4 (severe): Secondary | ICD-10-CM | POA: Diagnosis not present

## 2022-10-20 DIAGNOSIS — E875 Hyperkalemia: Secondary | ICD-10-CM | POA: Diagnosis not present

## 2022-10-20 DIAGNOSIS — E1110 Type 2 diabetes mellitus with ketoacidosis without coma: Secondary | ICD-10-CM | POA: Diagnosis not present

## 2022-10-20 LAB — COMPREHENSIVE METABOLIC PANEL
ALT: 12 U/L (ref 0–44)
AST: 16 U/L (ref 15–41)
Albumin: 2.5 g/dL — ABNORMAL LOW (ref 3.5–5.0)
Alkaline Phosphatase: 138 U/L — ABNORMAL HIGH (ref 38–126)
Anion gap: 7 (ref 5–15)
BUN: 67 mg/dL — ABNORMAL HIGH (ref 8–23)
CO2: 18 mmol/L — ABNORMAL LOW (ref 22–32)
Calcium: 8.5 mg/dL — ABNORMAL LOW (ref 8.9–10.3)
Chloride: 111 mmol/L (ref 98–111)
Creatinine, Ser: 2.86 mg/dL — ABNORMAL HIGH (ref 0.44–1.00)
GFR, Estimated: 17 mL/min — ABNORMAL LOW (ref >60)
Glucose, Bld: 146 mg/dL — ABNORMAL HIGH (ref 70–99)
Potassium: 5.4 mmol/L — ABNORMAL HIGH (ref 3.5–5.1)
Sodium: 136 mmol/L (ref 135–145)
Total Bilirubin: 0.4 mg/dL (ref 0.3–1.2)
Total Protein: 5.6 g/dL — ABNORMAL LOW (ref 6.5–8.1)

## 2022-10-20 LAB — GLUCOSE, CAPILLARY
Glucose-Capillary: 119 mg/dL — ABNORMAL HIGH (ref 70–99)
Glucose-Capillary: 209 mg/dL — ABNORMAL HIGH (ref 70–99)
Glucose-Capillary: 212 mg/dL — ABNORMAL HIGH (ref 70–99)
Glucose-Capillary: 77 mg/dL (ref 70–99)
Glucose-Capillary: 116 mg/dL — ABNORMAL HIGH (ref 70–99)

## 2022-10-20 LAB — CBC
HCT: 25.9 % — ABNORMAL LOW (ref 36.0–46.0)
Hemoglobin: 8.3 g/dL — ABNORMAL LOW (ref 12.0–15.0)
MCH: 29.7 pg (ref 26.0–34.0)
MCHC: 32 g/dL (ref 30.0–36.0)
MCV: 92.8 fL (ref 80.0–100.0)
Platelets: 122 10*3/uL — ABNORMAL LOW (ref 150–400)
RBC: 2.79 MIL/uL — ABNORMAL LOW (ref 3.87–5.11)
RDW: 14.3 % (ref 11.5–15.5)
WBC: 6.8 10*3/uL (ref 4.0–10.5)
nRBC: 0.3 % — ABNORMAL HIGH (ref 0.0–0.2)

## 2022-10-20 MED ORDER — METOPROLOL SUCCINATE ER 25 MG PO TB24: 25.0000 mg | ORAL_TABLET | Freq: Every day | ORAL | 0 refills | Status: DC

## 2022-10-20 MED ORDER — SODIUM CHLORIDE 0.9 % IV SOLN: INTRAVENOUS | Status: AC

## 2022-10-20 MED ORDER — INSULIN DETEMIR 100 UNIT/ML ~~LOC~~ SOLN
10.0000 [IU] | Freq: Every day | SUBCUTANEOUS | Status: DC
  Administered 2022-10-20: 10 [IU] via SUBCUTANEOUS
  Filled 2022-10-20 (×2): qty 0.1

## 2022-10-20 MED ORDER — HYDRALAZINE HCL 20 MG/ML IJ SOLN
5.0000 mg | Freq: Once | INTRAMUSCULAR | Status: AC
  Administered 2022-10-20: 5 mg via INTRAVENOUS
  Filled 2022-10-20: qty 1

## 2022-10-20 MED ORDER — HYDRALAZINE HCL 25 MG PO TABS
50.0000 mg | ORAL_TABLET | Freq: Three times a day (TID) | ORAL | Status: DC
  Administered 2022-10-20 – 2022-10-21 (×4): 50 mg via ORAL
  Filled 2022-10-20 (×4): qty 2

## 2022-10-20 NOTE — Discharge Summary (Signed)
Physician Discharge Summary   Patient: Heather [name] MRN: [id number] DOB: January 29, 1951  Admit date:     10/15/2022  Discharge date: 10/20/22  Discharge Physician: Deatra James   PCP: [name], MD   Recommendations at discharge:   Follow-up with PCP within 1-2 weeks, Follow-up with nephrologist, monitor kidney function, repeat BMP in 1-2 weeks Aggressive PT OT, fall precautions,  Discharge Diagnoses: Active Problems:   Acute renal failure superimposed on stage 4 chronic kidney disease ([place])   Uncontrolled type 2 diabetes mellitus with hyperglycemia, with long-term current use of insulin ([facility])   Essential hypertension   Hyperkalemia   Anxiety   History of right shoulder fracture   DKA, type 2 ([place])   AKI (acute kidney injury) ([place])  Resolved Problems:   * No resolved hospital problems. *  Hospital Course: Heather [name] is a 71 y/o female with history of diabetes mellitus type 2, hyperlipidemia, dCHF, Crohn's disease, CKD stage 4, B12 deficiency, anxiety disorder, stroke, HTN, chronic pancreatitis, mixed hyperlipidemia presenting with another fall.  She denies syncope.  Apparently, the patient fell 2 days prior to admission in the bathroom and hit the side of her head.  She she has been complaining of pain in her head and right shoulder since then.  The patient has had a history of frequent falls in the past 12 months.  She has chronic right leg weakness which seems to have started in late January 2023 after a fall.  She has been complaining of low back pain secondary to her falls.  She denies any syncope.  She was most recently admitted from 07/04/2022 to 07/07/2022 with acute on chronic pancreatitis, acute on chronic renal failure, and falls.  In addition, she had another hospitalization from 06/20/2022 to 06/22/2022 with acute on chronic pancreatitis, acute on chronic renal failure, and falls.  She has had a known right humerus fracture for which she is following Dr.  Amedeo Kinsman.  She endorses dietary indiscretion, but states that she has been taking her insulin. In the ED, the patient was afebrile and hemodynamically stable.  WBC 16.0, hemoglobin 8.0, platelets 204,000.  Sodium 125, potassium 6.3, bicarbonate 13, serum creatinine 3.37, serum glucose of 434.      Assessment/Plan: DKA type II -The patient presented with serum glucose up to 480 with anion gap 16 -Improved with IV fluids and subcutaneous insulin -Patient endorses dietary indiscretion -06/20/2022 hemoglobin A1c 10.0 -A1c> 15.5 -IV fluid-discontinued -Restarting insulin regimen, long-acting insulin, SSI coverage   Acute on chronic renal failure--CKD stage IV -Baseline creatinine 2.6-3.0 -Presented with serum creatinine 3.37 -Secondary to volume depletion and hemodynamic changes -ARB held on admission -Renal ultrasound without hydronephrosis -Currently, creatinine 3.2 which is not too far off her baseline Lab Results  Component Value Date   CREATININE 2.86 (H) 10/20/2022   CREATININE 3.22 (H) 10/19/2022   CREATININE 3.15 (H) 10/18/2022    -Difficult to assess urine output since intake and output has not been accurately recorded -Have asked staff to place pure wick catheter to better assess urine output   Hyperkalemia -Continue Lokelma -Continue IV fluids   Chronic diastolic CHF -Currently compensated -lower dose metoprolol succinate temporarily secondary to soft blood pressure -Monitor volume status while on IV fluids   Mixed hyperlipidemia -Continue statin   Essential hypertension -Holding hydralazine secondary to soft blood pressures -start metoprolol succinate at lower dose -Blood pressure currently stable   Crohn's disease -No active symptoms presently -Outpatient GI follow-up   Uncontrolled diabetes mellitus type  2 with hyperglycemia -A1c> 15.5 -NovoLog sliding scale -Patient has had brittle diabetes with episodes of hypoglycemia -She was on minimal dose of  Levemir, but still had hypoglycemia episodes -Hold further basal insulin for now and monitor blood sugars on sliding scale   Anxiety/depression -Continue Effexor, Geodon   Right Humerus fracture -Xray in ED shows no change when compared to 07/24/22 -Follow-up with orthopedics   Left patella fracture -Previously seen by orthopedics and was placed in knee immobilizer -Nonweightbearing left leg -Follow-up with orthopedics   Low back pain -Reports that this began after a fall -Reports that pain is rating down her legs -MRI lumbar spine without any concerning findings   Anemia, likely related to chronic kidney disease -No reported history of bleeding -Check FOBT -B12 853, folate 10.0 -Hemoglobin trended down to 7.0 -She was transfused 1 unit PRBC with improvement in hemoglobin to 8.0 -Continue to monitor   Normal anion gap metabolic acidosis -Improving with IV fluids, bicarb    Generalized weakness Fall prior to admission -PT recommended skilled nursing facility    Assessment and Plan: No notes have been filed under this hospital service. Service: Hospitalist        Disposition: Skilled nursing facility Diet recommendation:  Discharge Diet Orders (From admission, onward)     Start     Ordered   10/20/22 0000  Diet - low sodium heart healthy        10/20/22 0902           Carb modified diet DISCHARGE MEDICATION: Allergies as of 10/20/2022       Reactions   Cozaar [losartan] Nausea And Vomiting, Swelling, Rash   Quinolones Itching, Nausea And Vomiting, Nausea Only, Swelling   Swelling of face, jaw, and lips   Cymbalta [duloxetine Hcl] Itching, Swelling   Sulfa Antibiotics Nausea And Vomiting, Swelling   Headache  Swelling of feet, legs   Synalar [fluocinolone] Swelling   Cipro [ciprofloxacin Hcl] Nausea And Vomiting, Swelling   Swelling of face, jaw, lips   Diovan [valsartan] Nausea And Vomiting, Swelling   Glucophage [metformin] Other (See Comments)    GI upset   Ketalar [ketamine] Swelling   Norco [hydrocodone-acetaminophen] Itching, Swelling   Norvasc [amlodipine Besylate] Itching, Swelling   Fluid retention    Nsaids Nausea And Vomiting, Other (See Comments)   Has bleeding ulcers   Tape Itching, Rash   Paper tape can only be used on this patient   Zestril [lisinopril] Swelling, Rash   Oral swelling Red streaks on arms/legs        Medication List     STOP taking these medications    busPIRone 10 MG tablet Commonly known as: BUSPAR   calcitRIOL 0.25 MCG capsule Commonly known as: ROCALTROL   Colchicine 0.6 MG Caps   cyanocobalamin 1000 MCG/ML injection Commonly known as: VITAMIN B12   dicyclomine 20 MG tablet Commonly known as: BENTYL   fluconazole 150 MG tablet Commonly known as: DIFLUCAN   gabapentin 300 MG capsule Commonly known as: NEURONTIN   glipiZIDE 5 MG tablet Commonly known as: GLUCOTROL   hydrALAZINE 50 MG tablet Commonly known as: APRESOLINE   insulin glargine 100 UNIT/ML injection Commonly known as: LANTUS   insulin glargine-yfgn 100 UNIT/ML Pen Commonly known as: SEMGLEE   magnesium oxide 400 (240 Mg) MG tablet Commonly known as: MAG-OX   magnesium oxide 400 MG tablet Commonly known as: MAG-OX   metoprolol tartrate 100 MG tablet Commonly known as: LOPRESSOR   omega-3 acid ethyl esters  1 g capsule Commonly known as: LOVAZA   spironolactone 25 MG tablet Commonly known as: ALDACTONE   temazepam 15 MG capsule Commonly known as: RESTORIL   traMADol 50 MG tablet Commonly known as: ULTRAM   valsartan 80 MG tablet Commonly known as: DIOVAN   vitamin C with rose hips 500 MG tablet   Vitamin D 50 MCG (2000 UT) Caps       TAKE these medications    acetaminophen 325 MG tablet Commonly known as: TYLENOL Take 2 tablets (650 mg total) by mouth every 4 (four) hours.   allopurinol 100 MG tablet Commonly known as: ZYLOPRIM Take 100 mg by mouth daily.   atorvastatin 80 MG  tablet Commonly known as: LIPITOR Take 1 tablet (80 mg total) by mouth daily. Restart on 06/29/22   diphenhydrAMINE 2 % cream Commonly known as: BENADRYL Apply topically 3 (three) times daily as needed for itching.   Epogen 3000 UNIT/ML injection Generic drug: epoetin alfa Inject into the skin.   esomeprazole 40 MG capsule Commonly known as: NEXIUM Take 40 mg by mouth daily before breakfast.   Farxiga 10 MG Tabs tablet Generic drug: dapagliflozin propanediol Take 10 mg by mouth daily.   furosemide 40 MG tablet Commonly known as: LASIX Take 1 tablet (40 mg total) by mouth daily.   insulin aspart 100 UNIT/ML FlexPen Commonly known as: NOVOLOG Inject 5-11 Units into the skin 3 (three) times daily with meals.   Levemir FlexPen 100 UNIT/ML FlexPen Generic drug: insulin detemir Inject 20 Units into the skin at bedtime.   levETIRAcetam 500 MG tablet Commonly known as: KEPPRA Take 500 mg by mouth 2 (two) times daily.   lipase/protease/amylase 36000 UNITS Cpep capsule Commonly known as: CREON Take by mouth.   metoprolol succinate 25 MG 24 hr tablet Commonly known as: TOPROL-XL Take 1 tablet (25 mg total) by mouth daily. Start taking on: October 21, 2022 What changed:  medication strength how much to take additional instructions   naloxone 4 MG/0.1ML Liqd nasal spray kit Commonly known as: NARCAN SMARTSIG:Both Nares   oxyCODONE-acetaminophen 5-325 MG tablet Commonly known as: PERCOCET/ROXICET Take 1 pill every 6 hours for pain has not relieved by Tylenol or Motrin alone   Ozempic (1 MG/DOSE) 4 MG/3ML Sopn Generic drug: Semaglutide (1 MG/DOSE) Inject 1 mg into the skin once a week.   potassium chloride SA 20 MEQ tablet Commonly known as: KLOR-CON M Take 20 mEq by mouth 3 (three) times a week.   tiZANidine 4 MG tablet Commonly known as: ZANAFLEX Take 4 mg by mouth 2 (two) times daily.   venlafaxine XR 150 MG 24 hr capsule Commonly known as: EFFEXOR-XR Take  150 mg by mouth daily.   Vitamin D (Ergocalciferol) 1.25 MG (50000 UNIT) Caps capsule Commonly known as: DRISDOL Take 50,000 Units by mouth once a week.   ziprasidone 20 MG capsule Commonly known as: GEODON Take 20 mg by mouth 2 (two) times daily.               Discharge Care Instructions  (From admission, onward)           Start     Ordered   10/20/22 0000  Discharge wound care:       Comments: Pressure Injury 08/26/21 Buttocks Right;Left Stage 1 -  Intact skin with non-blanchable redness of a localized area usually over a bony prominence. 420 days    Pressure Injury 03/16/22 Buttocks Left;Medial Stage 2 -  Partial thickness loss of dermis  presenting as a shallow open injury with a red, pink wound bed without slough. pink wound bed, perimeter reddened and purple 218 days   Pressure Injury 06/21/22 Coccyx Right Stage 1 -  Intact skin with non-blanchable redness of a localized area usually over a bony prominence. red circular 0.5cm pressure injury directly on boney sacrum   10/20/22 0902            Contact information for after-discharge care     Destination     [facility] .   Service: Skilled Nursing Contact information: [address] [place] [phone]                    Discharge Exam: Danley Danker Weights   10/18/22 0500 10/19/22 0500 10/20/22 0500  Weight: 58.2 kg 58.3 kg 59.4 kg      Physical Exam:   General:  AAO x 3,  cooperative, no distress;   HEENT:  Normocephalic, PERRL, otherwise with in Normal limits   Neuro:  CNII-XII intact. , normal motor and sensation, reflexes intact   Lungs:   Clear to auscultation BL, Respirations unlabored,  No wheezes / crackles  Cardio:    S1/S2, RRR, No murmure, No Rubs or Gallops   Abdomen:  Soft, non-tender, bowel sounds active all four quadrants, no guarding or peritoneal signs.  Muscular  skeletal:  Limited exam  -global generalized weaknesses - in bed, able to move all 4 extremities,   2+ pulses,  symmetric, No pitting edema  Skin:  Dry, warm to touch, negative for any Rashes,  Wounds: Please see nursing documentation  Pressure Injury 08/26/21 Buttocks Right;Left Stage 1 -  Intact skin with non-blanchable redness of a localized area usually over a bony prominence. (Active)  08/26/21 0050  Location: Buttocks  Location Orientation: Right;Left  Staging: Stage 1 -  Intact skin with non-blanchable redness of a localized area usually over a bony prominence.  Wound Description (Comments):   Present on Admission: Yes     Pressure Injury 03/16/22 Buttocks Left;Medial Stage 2 -  Partial thickness loss of dermis presenting as a shallow open injury with a red, pink wound bed without slough. pink wound bed, perimeter reddened and purple (Active)  03/16/22 0015  Location: Buttocks  Location Orientation: Left;Medial  Staging: Stage 2 -  Partial thickness loss of dermis presenting as a shallow open injury with a red, pink wound bed without slough.  Wound Description (Comments): pink wound bed, perimeter reddened and purple  Present on Admission: Yes  Dressing Type Foam - Lift dressing to assess site every shift 10/20/22 1200     Pressure Injury 06/21/22 Coccyx Right Stage 1 -  Intact skin with non-blanchable redness of a localized area usually over a bony prominence. red circular 0.5cm pressure injury directly on boney sacrum (Active)  06/21/22 1000  Location: Coccyx  Location Orientation: Right  Staging: Stage 1 -  Intact skin with non-blanchable redness of a localized area usually over a bony prominence.  Wound Description (Comments): red circular 0.5cm pressure injury directly on boney sacrum  Present on Admission:           Condition at discharge: good  The results of significant diagnostics from this hospitalization (including imaging, microbiology, ancillary and laboratory) are listed below for  reference.   Imaging Studies: US RENAL  Result Date: 10/18/2022 CLINICAL DATA:  Acute kidney injury EXAM: RENAL / URINARY TRACT ULTRASOUND COMPLETE COMPARISON:  CT 07/04/2022  FINDINGS: Right Kidney: Renal measurements: 10.2 x 5.3 x 5.0 cm = volume: 142.0 mL. No hydronephrosis. Increased renal cortical echogenicity. Left Kidney: Renal measurements: 10.8 x 5.4 x 5.3 cm = volume: 160.8 mL. No hydronephrosis. Increased renal cortical echogenicity. Bladder: Appears normal for degree of bladder distention. Other: None. IMPRESSION: No hydronephrosis. Increased renal cortical echogenicity, as can be seen in medical renal disease. Electronically Signed   By: Maurine Simmering M.D.   On: 10/18/2022 13:12   MR LUMBAR SPINE WO CONTRAST  Result Date: 10/17/2022 CLINICAL DATA:  Low back pain after a fall. EXAM: MRI LUMBAR SPINE WITHOUT CONTRAST TECHNIQUE: Multiplanar, multisequence MR imaging of the lumbar spine was performed. No intravenous contrast was administered. COMPARISON:  MRI lumbar spine dated June 20, 2022. FINDINGS: Segmentation:  Standard. Alignment:  Unchanged 3 mm retrolisthesis at L1-L2. Vertebrae: Chronic T12 superior endplate compression fracture continues to demonstrate fluid within the fracture cleft and associated marrow edema, without progressive height loss. Unchanged 3 mm retropulsion of the posterosuperior endplate. No new fracture. Prior L1-L4 fusion. No evidence of discitis or suspicious bone lesion. Conus medullaris and cauda equina: Conus extends to the T12-L1 level. Conus and cauda equina appear normal. Paraspinal and other soft tissues: Negative. Disc levels: T11-T12: Unchanged minimal disc bulging and mild bilateral facet arthropathy. Unchanged mild right neuroforaminal stenosis. No spinal canal or left neuroforaminal stenosis. T12-L1:  Unchanged minimal disc bulging.  No stenosis. L1-L2: Prior posterior fusion. No significant disc bulge or herniation. No stenosis. L2-L3:  Prior PLIF.  No  residual stenosis. L3-L4:  Prior PLIF.  No residual stenosis. L4-L5: Unchanged small broad-based posterior disc protrusion and moderate bilateral facet arthropathy. Unchanged mild to moderate left neuroforaminal stenosis. No spinal canal or right neuroforaminal stenosis. L5-S1: Negative disc. Unchanged mild-to-moderate bilateral facet arthropathy. No stenosis. IMPRESSION: 1. Chronic T12 superior endplate compression fracture with persistent marrow edema but no progressive height loss. No new fracture. 2. Prior L1-L4 fusion without residual stenosis. 3. Unchanged mild-to-moderate left neuroforaminal stenosis at L4-L5. Electronically Signed   By: Titus Dubin M.D.   On: 10/17/2022 14:58   CT Head Wo Contrast  Result Date: 10/15/2022 CLINICAL DATA:  Head trauma EXAM: CT HEAD WITHOUT CONTRAST TECHNIQUE: Contiguous axial images were obtained from the base of the skull through the vertex without intravenous contrast. RADIATION DOSE REDUCTION: This exam was performed according to the departmental dose-optimization program which includes automated exposure control, adjustment of the mA and/or kV according to patient size and/or use of iterative reconstruction technique. COMPARISON:  CT head 08/22/2022.  MRI brain 08/23/2022. FINDINGS: Brain: No evidence of acute infarction, hemorrhage, hydrocephalus, extra-axial collection or mass lesion/mass effect. There is mild increased asymmetric hyperdensity in the left lentiform. There is no surrounding edema or mass effect. This is a new finding. This is area not convincing for acute hemorrhage. Vascular: Atherosclerotic calcifications are present within the cavernous internal carotid arteries. Skull: Normal. Negative for fracture or focal lesion. Sinuses/Orbits: No acute finding. Other: There is soft tissue swelling overlying the posterior right parietal region. IMPRESSION: 1. No acute intracranial hemorrhage. 2. Mild increased asymmetric hyperdensity in the left lentiform.  Findings are indeterminate, but can be seen with nonketotic hyperglycemic hemichorea. 3. Soft tissue swelling overlying the posterior right parietal region. Electronically Signed   By: Ronney Asters M.D.   On: 10/15/2022 17:40   CT Cervical Spine Wo Contrast  Result Date: 10/15/2022 CLINICAL DATA:  Trauma EXAM: CT CERVICAL SPINE WITHOUT CONTRAST TECHNIQUE: Multidetector CT imaging of the cervical spine  was performed without intravenous contrast. Multiplanar CT image reconstructions were also generated. RADIATION DOSE REDUCTION: This exam was performed according to the departmental dose-optimization program which includes automated exposure control, adjustment of the mA and/or kV according to patient size and/or use of iterative reconstruction technique. COMPARISON:  08/20/2022 FINDINGS: Alignment: Postoperative straightening of the normal cervical lordosis. Skull base and vertebrae: No acute fracture. No primary bone lesion or focal pathologic process. Soft tissues and spinal canal: No prevertebral fluid or swelling. No visible canal hematoma. Disc levels: Extensive multilevel discectomy and fusion from C3 through T1. Upper chest: Negative. Other: None. IMPRESSION: 1. No fracture or static subluxation of the cervical spine. 2. Extensive multilevel discectomy and fusion from C3 through T1. Electronically Signed   By: Delanna Ahmadi M.D.   On: 10/15/2022 17:31   DG Shoulder Right  Result Date: 10/15/2022 CLINICAL DATA:  Pelvis morning.  Possible history of fracture. EXAM: RIGHT SHOULDER - 2+ VIEW; RIGHT HUMERUS - 2+ VIEW COMPARISON:  Right shoulder radiographs 03/28/2022, 07/24/2022; FINDINGS: There is diffuse decreased bone mineralization. Redemonstration of comminuted, impacted now subacute to chronic fracture of the proximal right humerus including the inferior aspect of the humeral head and the surgical neck. There is high-grade (approximately 2.7 cm craniocaudal dimension) impaction of the humeral shaft  into the inferior aspect of the humeral head. High-grade bone loss of the inferior aspect of the humeral head. Approximately 1.4 cm anterior displacement of the humeral shaft on lateral view, unchanged. Moderate glenohumeral joint space narrowing. Old unchanged bone fragments overlying the inferior glenohumeral joint space. Remote healed fracture of the mid to lateral right clavicular shaft with associated clavicle shortening, unchanged. No acute fracture is seen.  No dislocation. Partial visualization of lower cervical spine anterior and posterior fusion hardware. IMPRESSION: Redemonstration of comminuted, impacted now subacute to chronic fracture of the proximal right humerus including the inferior aspect of the humeral head and the surgical neck. There is high-grade impaction of the humeral shaft into the inferior aspect of the humeral head. No significant change from 07/24/2022. Electronically Signed   By: Yvonne Kendall M.D.   On: 10/15/2022 17:11   DG Humerus Right  Result Date: 10/15/2022 CLINICAL DATA:  Pelvis morning.  Possible history of fracture. EXAM: RIGHT SHOULDER - 2+ VIEW; RIGHT HUMERUS - 2+ VIEW COMPARISON:  Right shoulder radiographs 03/28/2022, 07/24/2022; FINDINGS: There is diffuse decreased bone mineralization. Redemonstration of comminuted, impacted now subacute to chronic fracture of the proximal right humerus including the inferior aspect of the humeral head and the surgical neck. There is high-grade (approximately 2.7 cm craniocaudal dimension) impaction of the humeral shaft into the inferior aspect of the humeral head. High-grade bone loss of the inferior aspect of the humeral head. Approximately 1.4 cm anterior displacement of the humeral shaft on lateral view, unchanged. Moderate glenohumeral joint space narrowing. Old unchanged bone fragments overlying the inferior glenohumeral joint space. Remote healed fracture of the mid to lateral right clavicular shaft with associated clavicle  shortening, unchanged. No acute fracture is seen.  No dislocation. Partial visualization of lower cervical spine anterior and posterior fusion hardware. IMPRESSION: Redemonstration of comminuted, impacted now subacute to chronic fracture of the proximal right humerus including the inferior aspect of the humeral head and the surgical neck. There is high-grade impaction of the humeral shaft into the inferior aspect of the humeral head. No significant change from 07/24/2022. Electronically Signed   By: Yvonne Kendall M.D.   On: 10/15/2022 17:11    Microbiology: Results for  orders placed or performed during the hospital encounter of 10/15/22  Urine Culture     Status: None   Collection Time: 10/16/22  2:13 PM   Specimen: Urine, Clean Catch  Result Value Ref Range Status   Specimen Description   Final    URINE, CLEAN CATCH Performed at [facility], [address], [place], [place] [address]    Special Requests   Final    NONE Performed at [facility], [address], [place], [place] [address]    Culture   Final    NO GROWTH Performed at [facility], [place] [address], [place], [place] [address]    Report Status 10/17/2022 FINAL  Final    Labs: CBC: Recent Labs  Lab 10/15/22 1609 10/16/22 0502 10/17/22 0419 10/18/22 0423 10/19/22 0358 10/20/22 0447  WBC 19.9* 16.0* 10.1 7.8 7.4 6.8  NEUTROABS 17.7*  --   --   --   --   --   HGB 10.0* 8.0* 7.4* 7.0* 8.0* 8.3*  HCT 30.3* 24.9* 23.0* 22.4* 25.9* 25.9*  MCV 92.7 94.7 94.3 96.6 94.5 92.8  PLT 296 184 169 140* 134* 034*   Basic Metabolic Panel: Recent Labs  Lab 10/15/22 1944 10/16/22 0502 10/17/22 0419 10/18/22 0423 10/19/22 0358 10/20/22 0447  NA 125* 130* 130* 130* 133* 136  K 6.3* 5.0 4.8 5.9* 5.4* 5.4*  CL 96* 102 104 104 108 111  CO2 13* 19* 18* 18* 15* 18*  GLUCOSE 434* 88 152* 136* 46* 146*  BUN 77* 77* 73* 72* 73* 67*  CREATININE 3.37* 3.17* 3.06* 3.15* 3.22* 2.86*  CALCIUM 9.3 9.0 8.5* 8.2* 7.9* 8.5*  MG 2.1   --  1.9  --   --   --    Liver Function Tests: Recent Labs  Lab 10/16/22 0502 10/20/22 0447  AST 16 16  ALT 13 12  ALKPHOS 84 138*  BILITOT 0.5 0.4  PROT 5.8* 5.6*  ALBUMIN 2.6* 2.5*   CBG: Recent Labs  Lab 10/19/22 1150 10/19/22 1701 10/19/22 2020 10/20/22 0731 10/20/22 1110  GLUCAP 171* 160* 196* 119* 209*    Discharge time spent: greater than 45 minutes.  Signed: Deatra James, MD [facility] 10/20/2022

## 2022-10-20 NOTE — Hospital Course (Signed)
Heather [name] is a 71 y/o female with history of diabetes mellitus type 2, hyperlipidemia, dCHF, Crohn's disease, CKD stage 4, B12 deficiency, anxiety disorder, stroke, HTN, chronic pancreatitis, mixed hyperlipidemia presenting with another fall.  She denies syncope.  Apparently, the patient fell 2 days prior to admission in the bathroom and hit the side of her head.  She she has been complaining of pain in her head and right shoulder since then.  The patient has had a history of frequent falls in the past 12 months.  She has chronic right leg weakness which seems to have started in late January 2023 after a fall.  She has been complaining of low back pain secondary to her falls.  She denies any syncope.  She was most recently admitted from 07/04/2022 to 07/07/2022 with acute on chronic pancreatitis, acute on chronic renal failure, and falls.  In addition, she had another hospitalization from 06/20/2022 to 06/22/2022 with acute on chronic pancreatitis, acute on chronic renal failure, and falls.  She has had a known right humerus fracture for which she is following [name].  She endorses dietary indiscretion, but states that she has been taking her insulin. In the ED, the patient was afebrile and hemodynamically stable.  WBC 16.0, hemoglobin 8.0, platelets 204,000.  Sodium 125, potassium 6.3, bicarbonate 13, serum creatinine 3.37, serum glucose of 434.      Assessment/Plan: DKA type II -The patient presented with serum glucose up to 480 with anion gap 16 -Improved with IV fluids and subcutaneous insulin -Patient endorses dietary indiscretion -06/20/2022 hemoglobin A1c 10.0 -A1c> 15.5 -Continue IV fluids   Acute on chronic renal failure--CKD stage IV -Baseline creatinine 2.6-3.0 -Presented with serum creatinine 3.37 -Secondary to volume depletion and hemodynamic changes -ARB held on admission -Renal ultrasound without hydronephrosis -Currently, creatinine 3.2 which is not too far off her  baseline -Difficult to assess urine output since intake and output has not been accurately recorded -Have asked staff to place pure wick catheter to better assess urine output   Hyperkalemia -Continue Lokelma -Continue IV fluids   Chronic diastolic CHF -Currently compensated -lower dose metoprolol succinate temporarily secondary to soft blood pressure -Monitor volume status while on IV fluids   Mixed hyperlipidemia -Continue statin   Essential hypertension -Holding hydralazine secondary to soft blood pressures -start metoprolol succinate at lower dose -Blood pressure currently stable   Crohn's disease -No active symptoms presently -Outpatient GI follow-up   Uncontrolled diabetes mellitus type 2 with hyperglycemia -A1c> 15.5 -NovoLog sliding scale -Patient has had brittle diabetes with episodes of hypoglycemia -She was on minimal dose of Levemir, but still had hypoglycemia episodes -Hold further basal insulin for now and monitor blood sugars on sliding scale   Anxiety/depression -Continue Effexor, Geodon   Right Humerus fracture -Xray in ED shows no change when compared to 07/24/22 -Follow-up with orthopedics   Left patella fracture -Previously seen by orthopedics and was placed in knee immobilizer -Nonweightbearing left leg -Follow-up with orthopedics   Low back pain -Reports that this began after a fall -Reports that pain is rating down her legs -MRI lumbar spine without any concerning findings   Anemia, likely related to chronic kidney disease -No reported history of bleeding -Check FOBT -B12 853, folate 10.0 -Hemoglobin trended down to 7.0 -She was transfused 1 unit PRBC with improvement in hemoglobin to 8.0 -Continue to monitor   Normal anion gap metabolic acidosis -Likely related to renal failure -Will start on bicarbonate infusion   Generalized weakness Fall prior  to admission -PT recommended skilled nursing facility

## 2022-10-20 NOTE — Progress Notes (Signed)
Pt A&O X 3 and has been confused through the night. BP has been elevated. Manual BP 170/74 last night. Manual BP this morning was 196/84. MD notified and Hydralazine was ordered and given. Other vitals WDL. Pt reported 10/10 pain. PRN dilaudid given.

## 2022-10-21 DIAGNOSIS — E1110 Type 2 diabetes mellitus with ketoacidosis without coma: Secondary | ICD-10-CM | POA: Diagnosis not present

## 2022-10-21 LAB — CBC
HCT: 28.5 % — ABNORMAL LOW (ref 36.0–46.0)
Hemoglobin: 9.4 g/dL — ABNORMAL LOW (ref 12.0–15.0)
MCH: 30.3 pg (ref 26.0–34.0)
MCHC: 33 g/dL (ref 30.0–36.0)
MCV: 91.9 fL (ref 80.0–100.0)
Platelets: 130 10*3/uL — ABNORMAL LOW (ref 150–400)
RBC: 3.1 MIL/uL — ABNORMAL LOW (ref 3.87–5.11)
RDW: 14.8 % (ref 11.5–15.5)
WBC: 9.4 10*3/uL (ref 4.0–10.5)
nRBC: 0 % (ref 0.0–0.2)

## 2022-10-21 LAB — BASIC METABOLIC PANEL
Anion gap: 9 (ref 5–15)
BUN: 61 mg/dL — ABNORMAL HIGH (ref 8–23)
CO2: 18 mmol/L — ABNORMAL LOW (ref 22–32)
Calcium: 9.2 mg/dL (ref 8.9–10.3)
Chloride: 112 mmol/L — ABNORMAL HIGH (ref 98–111)
Creatinine, Ser: 2.62 mg/dL — ABNORMAL HIGH (ref 0.44–1.00)
GFR, Estimated: 19 mL/min — ABNORMAL LOW (ref >60)
Glucose, Bld: 105 mg/dL — ABNORMAL HIGH (ref 70–99)
Potassium: 5 mmol/L (ref 3.5–5.1)
Sodium: 139 mmol/L (ref 135–145)

## 2022-10-21 LAB — GLUCOSE, CAPILLARY
Glucose-Capillary: 65 mg/dL — ABNORMAL LOW (ref 70–99)
Glucose-Capillary: 190 mg/dL — ABNORMAL HIGH (ref 70–99)
Glucose-Capillary: 129 mg/dL — ABNORMAL HIGH (ref 70–99)

## 2022-10-21 MED ORDER — METOPROLOL TARTRATE 5 MG/5ML IV SOLN
5.0000 mg | Freq: Once | INTRAVENOUS | Status: AC
  Administered 2022-10-21: 5 mg via INTRAVENOUS
  Filled 2022-10-21: qty 5

## 2022-10-21 MED ORDER — HYDRALAZINE HCL 50 MG PO TABS: 50.0000 mg | ORAL_TABLET | Freq: Three times a day (TID) | ORAL | 0 refills | Status: DC

## 2022-10-21 NOTE — TOC Transition Note (Signed)
Transition of Care [facility]) - CM/SW Discharge Note   Patient Details  Name: Heather [name] MRN: [id number] Date of Birth: 03-13-1951  [facility] CM/SW Contact:  Shade Flood, LCSW Phone Number: 10/21/2022, 9:31 AM   Clinical Narrative:     Pt stable for dc and refusing SNF per MD. Damaris Schooner with pt who confirms that she plans to return home with [facility]. CMS provider options reviewed. Referred as requested.   No other TOC needs for dc.  Final next level of care: Home w Home Health Services Barriers to Discharge: Barriers Resolved   Patient Goals and CMS Choice CMS Medicare.gov Compare Post Acute Care list provided to:: Patient Choice offered to / list presented to : Patient  Discharge Placement                         Discharge Plan and Services Additional resources added to the After Visit Summary for   In-house Referral: Clinical Social Work   Post Acute Care Choice: [place]: PT [facility] Agency: [place] Date [place]: 10/21/22   Representative spoke with at [place]: [place] Determinants of Health ([place]) Interventions [place]: No Food Insecurity (10/16/2022)  Housing: Low Risk  (10/16/2022)  Transportation Needs: No Transportation Needs (10/16/2022)  Utilities: Not At Risk (10/16/2022)  Tobacco Use: Low Risk  (10/15/2022)     Readmission Risk Interventions    10/18/2022   12:42 PM 07/05/2022   11:42 AM 06/20/2022    2:55 PM  Readmission Risk Prevention Plan  Transportation Screening Complete Complete Complete  Medication Review Press photographer) Complete Complete Complete  PCP or Specialist appointment within 3-5 days of discharge   Not Complete  HRI or Home Care Consult Complete Complete   SW Recovery Care/Counseling Consult Complete Complete Complete  Palliative Care Screening Not Applicable Not Applicable Not [place] Complete Not Applicable Not Applicable

## 2022-10-21 NOTE — Progress Notes (Signed)
Yellow mews. Charge nurse and MD notified.   10/21/22 0520  Vitals  Temp 98.2 F (36.8 C)  Temp Source Oral  BP (!) 202/98  BP Location Left Arm  BP Method Manual  Patient Position (if appropriate) Lying  Pulse Rate (!) 104  Pulse Rate Source Radial  MEWS COLOR  MEWS Score Color Yellow  MEWS Score  MEWS Temp 0  MEWS Systolic 2  MEWS Pulse 1  MEWS RR 0  MEWS LOC 0  MEWS Score 3

## 2022-10-21 NOTE — Progress Notes (Signed)
Patient seen and evaluated this morning and is stable for discharge.  Please refer to discharge summary dictated 12/23 for full details.  She was scheduled to go to SNF per PT/OT evaluation, but unfortunately she declines at this time and has full decision-making capacity to do so.  She understands the risks of her falling and hurting herself at home and states that she has a walker and cane to assist her with ambulation and is okay with receiving home health PT/OT services which have now been arranged.  No other acute overnight events noted.  She has been encouraged to see her endocrinologist Dr. Dorris Fetch in the very near future to assist her with her blood glucose management.  She may continue her usual home medications as otherwise prescribed and has been encouraged to watch her diet more carefully which appears to be a large source of the problem.  Total care time: 30 minutes.

## 2022-10-29 ENCOUNTER — Encounter (HOSPITAL_COMMUNITY): Payer: Self-pay | Admitting: Nephrology

## 2022-10-30 ENCOUNTER — Encounter (HOSPITAL_COMMUNITY): Payer: Self-pay | Admitting: Nephrology

## 2022-11-01 ENCOUNTER — Encounter: Payer: Self-pay | Admitting: Nurse Practitioner

## 2022-11-01 ENCOUNTER — Ambulatory Visit: Payer: Medicare PPO | Attending: Nurse Practitioner | Admitting: Nurse Practitioner

## 2022-11-01 VITALS — BP 132/60 | HR 70 | Ht 64.0 in | Wt 113.8 lb

## 2022-11-01 DIAGNOSIS — I10 Essential (primary) hypertension: Secondary | ICD-10-CM | POA: Diagnosis not present

## 2022-11-01 DIAGNOSIS — I700 Atherosclerosis of aorta: Secondary | ICD-10-CM

## 2022-11-01 DIAGNOSIS — I2510 Atherosclerotic heart disease of native coronary artery without angina pectoris: Secondary | ICD-10-CM | POA: Diagnosis not present

## 2022-11-01 DIAGNOSIS — I5032 Chronic diastolic (congestive) heart failure: Secondary | ICD-10-CM

## 2022-11-01 DIAGNOSIS — N184 Chronic kidney disease, stage 4 (severe): Secondary | ICD-10-CM | POA: Diagnosis not present

## 2022-11-01 DIAGNOSIS — I779 Disorder of arteries and arterioles, unspecified: Secondary | ICD-10-CM | POA: Diagnosis not present

## 2022-11-01 DIAGNOSIS — E785 Hyperlipidemia, unspecified: Secondary | ICD-10-CM

## 2022-11-01 DIAGNOSIS — D329 Benign neoplasm of meninges, unspecified: Secondary | ICD-10-CM

## 2022-11-01 DIAGNOSIS — I272 Pulmonary hypertension, unspecified: Secondary | ICD-10-CM

## 2022-11-01 DIAGNOSIS — Z9181 History of falling: Secondary | ICD-10-CM | POA: Diagnosis not present

## 2022-11-01 DIAGNOSIS — I6330 Cerebral infarction due to thrombosis of unspecified cerebral artery: Secondary | ICD-10-CM

## 2022-11-01 NOTE — Patient Instructions (Addendum)
Medication Instructions:  Continue all current medications.  Labwork: none  Testing/Procedures: none  Follow-Up: 6 months   Any Other Special Instructions Will Be Listed Below (If Applicable). You have been referred to Neurology  If you need a refill on your cardiac medications before your next appointment, please call your pharmacy.

## 2022-11-01 NOTE — Progress Notes (Signed)
Cardiology Office Note:    Date:  11/01/2022  ID:  [name], DOB 1951/10/14, MRN [id number]  PCP:  [name], [place] [facility] Cardiologist:  Carlyle Dolly, MD     Referring MD: [name], MD   CC: Here for follow-up  History of Present Illness:    Heather [name] is a 72 y.o. female with a hx of the following:   Carotid artery disease  HLD Coronary arteriosclerosis Chronic diastolic CHF HTN Pulmonary HTN Hx of CVA GERD Hiatal hernia Crohn's disease Chronic pancreatitis T2DM BPPV CKD stage 4 ([facility]) Personal history of failed moderate sedation Depression Frequent falls, hx of chronic ischemic infarcts, brain meningioma   Previous cardiovascular history includes chronic chest pain, normal catheterizations in 2003, 2005, and 2012.  Nuclear stress test in 2010 and 2014 were both negative.  Echo in 2022 revealed LVEF 60 to 65%, no RWMA, mildly elevated pulmonary artery systolic pressure, mild MR, trivial TR, estimated RVSP 37.2 mmHg, otherwise normal findings.  Previous history of syncope, was seen by [facility] cardiology and received work-up.  Cardiac monitor that was arranged and was negative.  Furthermore, she experienced palpitations and in 2018 wore an event monitor that did not reveal any arrhythmias.  She is followed by nephrology for history of CKD stage IV, on labetalol that has been increased.  Has multiple allergies to several antihypertensives, hydralazine was stopped in 2022 due to issues with hypotension in setting of pancreatitis and diarrhea.  Was admitted for [id number] in [id number], complicated by DKA.  BP was well controlled at last OV with Dr. Carlyle Dolly on April 28, 2021.  She reported chronic mild symptoms of palpitations, was advised to continue beta-blocker.  Overall was doing well from a cardiac perspective.  Most recently, she presented to the ED on July 04, 2022 with chief complaint of back pain,  presented with 3-day onset of left flank pain that radiated to left upper quadrant and epigastric area.  Associated symptoms included N/V.  Patient also reported 3 unwitnessed falls due to weakness, reported chronic double vision.  CT of chest, abdomen, and pelvis revealed acute pancreatitis, no acute pulmonary findings, complex right humeral head and neck fracture, aortic atherosclerosis and coronary artery atherosclerosis.  Was admitted for further evaluation and treatment due to recurrent acute on chronic pancreatitis.  GI was consulted and recommended to discontinue Ozempic that was likely causing recurrence of pancreatitis.  She was told to follow-up with GI and PCP after discharge.  Today she presents for hospital follow-up and preoperative cardiovascular risk assessment.  She is scheduled to have right reverse shoulder arthroplasty by Dr. Justice Britain with [facility].  Date of surgery is TBD.  Type of clearance requested: No medications are listed as needing to be held.  Surgery will be done under general anesthesia.  Today she states she is doing well.  Denies any chest pain, shortness of breath, palpitations, syncope, presyncope, dizziness, recurrent falls, orthopnea, PND, swelling, bleeding, or claudication.  States blood pressure readings at home are labile.  She is able to complete 4 METS of activity, and prior to right shoulder injury, she was able to perform heavy housework and yard work.  Denies any issues of bleeding or any cardiac complaints or concerns.  Denies any other questions or concerns today. [name] for hx of CKD stage 4.   Since I last saw patient, she was admitted to [facility] in 07/2022 for a syncopal episode. She had a recent fall  at home with fx of left patella. Was at orthopedics office when she sustained a near syncope and then syncopal episode. She sustained facial injuries. Echocardiogram showed normal EF, grade 1 DD, mild pulmonary HTN, otherwise no significant valvular  abnormalities.  MRI of brain showed nothing acute, 6 mm meningioma overlying anterior right frontal lobe, symmetric remote insults within the global , pallidi Bilaterally -which may reflect chronic lacunar infarcts or sequela of prior toxic/metabolic insult, mild chronic small vessel ischemic changes within the cerebral white matter and pons, tiny chronic infarct within the left cerebellar hemisphere, mild generalized parenchymal atrophy.  CT of the head did not show anything acute, nasal fractures noted.  No DVT noted with lower extremity Dopplers.  Chest x-ray was unremarkable.  EKG showed normal sinus rhythm, nonspecific ST and T wave abnormality.  Her syncopal episode was probably due to bradycardia, therefore her beta-blocker was held.  PT and OT were consulted.  Orthopedics decided not to operate at this time and would follow her up outpatient.  Admitted to [facility] 09/2022 for CC of frequent falls, fell 2 days prior to ED visit while in the bathroom, hit her head on the side of a table. Patient reported falls were secondary to generalized weakness.  Hospital course was complicated by AKI on CKD stage IV as well as hyperkalemia, hyponatremia, and hyperglycemia.  Received IV fluids and Lokelma.  Renal ultrasound did not show hydronephrosis.  Was compensated on exam.  Hydralazine was held secondary to soft blood pressures and metoprolol succinate dose was lowered.  PT followed her and recommended SNF due to frequent falls.  Did receive PRBC due to anemia from CKD.  Was discharged in stable condition to SNF.  Today she states she refused SNF and went home, even though the medical team recommended SNF. She is doing well from a cardiac perspective and here with her husband.  Denies any chest pain, shortness of breath, palpitations, syncope, presyncope, dizziness, orthopnea, PND, swelling, significant weight changes, acute bleeding, claudication.  She does admit to falls since she left the hospital. Denies any  injuries. Sees a nephrologist regularly. Denies any other questions or concerns today. Her husband is Heather [name], another patient of mine.   Past Medical History:  Diagnosis Date   Acute renal failure superimposed on stage 3 chronic kidney disease ([place]) 01/21/2016   Anemia    Anxiety    B12 deficiency    Brain tumor (benign) ([place]) 10/29/2020   Cellulitis and abscess    Abdomen and buttocks   Chronic diastolic CHF (congestive heart failure) ([place]) 03/16/2022   CKD (chronic kidney disease) stage 3, GFR 30-59 ml/min (HCC)    Closed fracture of distal end of femur, unspecified fracture morphology, initial encounter ([place])    Coronary arteriosclerosis 07/15/2018   Crohn disease ([place])    Depression    Diabetic neuropathy (HCC)    feet   DJD (degenerative joint disease)    Right forminal stenosis C4-5   Essential hypertension    Femur fracture, right ([place]) 42/87/6811   Folliculitis    GERD (gastroesophageal reflux disease)    Gout    Headache(784.0)    Hiatal hernia    History of blood transfusion    History of bronchitis    History of cardiac catheterization    No significant CAD 2012   History of kidney stones    surgery to remove   Insomnia    Irritable bowel syndrome    Mixed hyperlipidemia    Osteoarthritis  Palpitations    Pneumonia    Reflux esophagitis    Salmonella    Stroke [facility])    Tinnitus    Right   Type 2 diabetes mellitus [facility])     Past Surgical History:  Procedure Laterality Date   BACK SURGERY     c-spine surgery     03/2007   CARDIAC CATHETERIZATION     CHOLECYSTECTOMY     COLONOSCOPY  04/21/2011; 05/29/11   6/12: [place] - ?AVM at IC valve, inflammatory changes at IC valve [facility]); 7/12 - [facility]; IC valve erosions, look chronic and probably Crohn's per path   colonoscopy  2017   [place]: random biopsies normal. TI normal   ESOPHAGOGASTRODUODENOSCOPY  05/29/11   normal   ESOPHAGOGASTRODUODENOSCOPY N/A 01/21/2016   Dr. Michail Sermon:  normal   EYE SURGERY Bilateral    cataracts removed   FEMUR IM NAIL Right 04/10/2018   Procedure: INTRAMEDULLARY (IM) NAIL FEMORAL;  Surgeon: Paralee Cancel, MD;  Location: [facility];  Service: Orthopedics;  Laterality: Right;   FRACTURE SURGERY     GIVENS CAPSULE STUDY  11/17/2020   HARDWARE REMOVAL Right 04/10/2018   Procedure: HARDWARE REMOVAL RIGHT DISTAL FEMUR;  Surgeon: Paralee Cancel, MD;  Location: [facility];  Service: Orthopedics;  Laterality: Right;   HERNIA REPAIR     IR FLUORO GUIDE CV LINE RIGHT  04/01/2017   IR US GUIDE VASC ACCESS RIGHT  04/01/2017   JOINT REPLACEMENT Right    hip   OPEN REDUCTION INTERNAL FIXATION (ORIF) DISTAL RADIAL FRACTURE Right 05/28/2019   Procedure: OPEN REDUCTION INTERNAL FIXATION (ORIF) DISTAL RADIAL FRACTURE;  Surgeon: Verner Mould, MD;  Location: [place];  Service: Orthopedics;  Laterality: Right;  with block 90 minutes   ORIF FEMUR FRACTURE Right 03/31/2017   Procedure: OPEN REDUCTION INTERNAL FIXATION (ORIF) DISTAL FEMUR FRACTURE;  Surgeon: Paralee Cancel, MD;  Location: [place];  Service: Orthopedics;  Laterality: Right;   removal of kidney stone     Right knee arthroscopic surgery     TONSILLECTOMY     TOTAL ABDOMINAL HYSTERECTOMY      Current Medications: Current Meds  Medication Sig   acetaminophen (TYLENOL) 325 MG tablet Take 2 tablets (650 mg total) by mouth every 4 (four) hours. (Patient taking differently: Take 650 mg by mouth every 4 (four) hours as needed for mild pain, moderate pain or fever.)   allopurinol (ZYLOPRIM) 100 MG tablet Take 100 mg by mouth daily.   atorvastatin (LIPITOR) 80 MG tablet Take 1 tablet (80 mg total) by mouth daily. Restart on 06/29/22   diphenhydrAMINE (BENADRYL) 2 % cream Apply topically 3 (three) times daily as needed for itching.   epoetin alfa (EPOGEN) 3000 UNIT/ML injection Inject 3,000 Units into the skin once a week.   esomeprazole (NEXIUM) 40 MG capsule Take 40 mg by mouth daily before breakfast.     FARXIGA  10 MG TABS tablet Take 10 mg by mouth daily.   furosemide (LASIX) 40 MG tablet Take 1 tablet (40 mg total) by mouth daily.   hydrALAZINE (APRESOLINE) 50 MG tablet Take 1 tablet (50 mg total) by mouth every 8 (eight) hours.   insulin aspart (NOVOLOG) 100 UNIT/ML FlexPen Inject 5-11 Units into the skin 3 (three) times daily with meals.   insulin detemir (LEVEMIR FLEXPEN) 100 UNIT/ML FlexPen Inject 20 Units into the skin at bedtime.   levETIRAcetam (KEPPRA) 500 MG tablet Take 500 mg by mouth 2 (two) times daily.   lipase/protease/amylase (CREON) 36000  UNITS CPEP capsule Take 36,000 Units by mouth 3 (three) times daily with meals.   metoprolol succinate (TOPROL-XL) 25 MG 24 hr tablet Take 1 tablet (25 mg total) by mouth daily.   naloxone (NARCAN) nasal spray 4 mg/0.1 mL Place 0.4 mg into the nose once.   oxyCODONE-acetaminophen (PERCOCET/ROXICET) 5-325 MG tablet Take 1 pill every 6 hours for pain has not relieved by Tylenol or Motrin alone   OZEMPIC, 1 MG/DOSE, 4 MG/3ML SOPN Inject 1 mg into the skin once a week.   potassium chloride SA (KLOR-CON M) 20 MEQ tablet Take 20 mEq by mouth 3 (three) times a week.   tiZANidine (ZANAFLEX) 4 MG tablet Take 4 mg by mouth 2 (two) times daily.   venlafaxine XR (EFFEXOR-XR) 150 MG 24 hr capsule Take 150 mg by mouth daily.   Vitamin D, Ergocalciferol, (DRISDOL) 1.25 MG (50000 UNIT) CAPS capsule Take 50,000 Units by mouth once a week.   ziprasidone (GEODON) 20 MG capsule Take 20 mg by mouth 2 (two) times daily.     Allergies:   Cozaar [losartan], Quinolones, Cymbalta [duloxetine hcl], Sulfa antibiotics, Synalar [fluocinolone], Cipro [ciprofloxacin hcl], Diovan [valsartan], Glucophage [metformin], Ketalar [ketamine], Norco [hydrocodone-acetaminophen], Norvasc [amlodipine besylate], Nsaids, Tape, and Zestril [lisinopril]   Social History   Socioeconomic History   Marital status: Married    Spouse name: Not on file   Number of children: 2   Years of education:  Not on file   Highest education level: Not on file  Occupational History   Occupation: Disabled  Tobacco Use   Smoking status: Never   Smokeless tobacco: Never  Vaping Use   Vaping Use: Never used  Substance and Sexual Activity   Alcohol use: No   Drug use: No   Sexual activity: Not on file    Comment: Hysterectomy  Other Topics Concern   Not on file  Social History Narrative   Patient is married 2 children she is disabled, 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    Social Determinants of Health   Financial Resource Strain: Not on file  Food Insecurity: No Food Insecurity (10/16/2022)   Hunger Vital Sign    Worried About Running Out of Food in the Last Year: Never true    Ran Out of Food in the Last Year: Never true  Transportation Needs: No Transportation Needs (10/16/2022)   PRAPARE - Hydrologist (Medical): No    Lack of Transportation (Non-Medical): No  Physical Activity: Not on file  Stress: Not on file  Social Connections: Not on file     Family History: The patient's family history includes Colon cancer in her brother; Diabetes in her mother. There is no history of Esophageal cancer, Rectal cancer, or Stomach cancer.  ROS:   Review of Systems  Constitutional: Negative.   HENT: Negative.    Eyes: Negative.   Respiratory: Negative.    Cardiovascular: Negative.   Gastrointestinal: Negative.   Genitourinary: Negative.   Musculoskeletal:  Positive for falls and joint pain. Negative for back pain, myalgias and neck pain.       See HPI.  Skin: Negative.   Neurological: Negative.   Endo/Heme/Allergies: Negative.   Psychiatric/Behavioral: Negative.     Please see  the history of present illness.    All other systems reviewed and are negative.  EKGs/Labs/Other  Studies Reviewed:    The following studies were reviewed today:   EKG:  EKG is not ordered today.     Bilateral carotid duplex on August 28, 2021: Right Carotid: Not visualized due to central neck line. Left Carotid: Velocities in the left ICA are consistent with a 1-39%  stenosis. Vertebrals: Left vertebral artery demonstrates antegrade flow.   Vascular ultrasound lower extremity venous bilateral (DVT) on August 28, 2021: - No evidence of deep vein thrombosis seen in the lower extremities,  bilaterally.  -No evidence of popliteal cyst, bilaterally.    2D echocardiogram on August 21, 2021:  1. Left ventricular ejection fraction, by estimation, is 60 to 65%. The  left ventricle has normal function. The left ventricle has no regional  wall motion abnormalities. Left ventricular diastolic parameters were  normal.   2. Right ventricular systolic function is normal. The right ventricular  size is normal. There is mildly elevated pulmonary artery systolic  pressure.   3. Left atrial size was mildly dilated.   4. The mitral valve is normal in structure. Mild mitral valve  regurgitation.   5. The aortic valve is tricuspid. Aortic valve regurgitation is not  visualized. Mild aortic valve sclerosis is present, with no evidence of  aortic valve stenosis.   6. The inferior vena cava is normal in size with greater than 50%  respiratory variability, suggesting right atrial pressure of 3 mmHg.   Comparison(s): The left ventricular function is unchanged.   Cardiac telemetry monitor on August 05, 2017: Telemetry tracings show NSR and ST.  No significant arrhythmias.  No symptoms reported.  Recent Labs: 10/15/2022: TSH 2.284 10/17/2022: Magnesium 1.9 10/20/2022: ALT 12 10/21/2022: BUN 61; Creatinine, Ser 2.62; Hemoglobin 9.4; Platelets 130; Potassium 5.0; Sodium 139  Recent Lipid Panel    Component Value Date/Time   CHOL 115 06/21/2022 0607   CHOL 405 (H) 08/22/2020 1426   TRIG  116 06/21/2022 0607   HDL 44 06/21/2022 0607   HDL 30 (L) 08/22/2020 1426   CHOLHDL 2.6 06/21/2022 0607   VLDL 23 06/21/2022 0607   LDLCALC 48 06/21/2022 0607   LDLCALC Comment (A) 08/22/2020 1426   LDLDIRECT 117.9 (H) 05/04/2021 1150    Physical Exam:    VS:  BP 132/60   Pulse 70   Ht _0  (1.626 m)   Wt 113 lb 12.8 oz (51.6 kg)   LMP  (LMP Unknown)   SpO2 98%   BMI 19.53 kg/m     Wt Readings from Last 3 Encounters:  11/01/22 113 lb 12.8 oz (51.6 kg)  10/21/22 130 lb 8.2 oz (59.2 kg)  08/30/22 125 lb (56.7 kg)     GEN: Thin, frail 72 y.o. female in no acute distress HEENT: Normal NECK: No JVD; No carotid bruits CARDIAC: S1/S2, RRR, no murmurs, rubs, gallops; 2+ peripheral pulses throughout RESPIRATORY:  Clear and diminished to auscultation without rales, wheezing or rhonchi  MUSCULOSKELETAL:  No edema; No deformity  SKIN: Thin skin, warm and dry NEUROLOGIC:  Alert and oriented x 3 PSYCHIATRIC:  Normal affect, quiet demeanor   ASSESSMENT:    1. Chronic diastolic CHF (congestive heart failure) ([place])   2. Atherosclerosis of coronary artery of native heart without angina pectoris, unspecified vessel or lesion type   3. Aortic atherosclerosis (HCC)   4. Carotid artery disease, unspecified laterality, unspecified type ([place])   5. Hyperlipidemia, unspecified hyperlipidemia  type   6. Hypertension, unspecified type   7. Pulmonary hypertension, unspecified ([place])   8. CKD (chronic kidney disease) stage 4, GFR 15-29 ml/min (HCC)   9. At high risk for falls   10. Meningioma ([place])   11. Cerebral thrombosis with cerebral infarction    PLAN:    In order of problems listed above:  Chronic diastolic CHF Echo in 8676 showed normal EF, no WMA's. Euvolemic and well compensated on exam. Low sodium diet, fluid restriction <2L, and daily weights encouraged. Educated to contact our office for weight gain of 2 lbs overnight or 5 lbs in one week. Continue current medication regimen.  Will obtain BMET today.   2. Coronary and aortic atherosclerosis Stable with no anginal symptoms. No indication for ischemic evaluation. Continue current medication regimen.  Last LDL was 48 in August 2023.  No medication changes at this time. Heart healthy diet, low-salt diet encouraged.   3.  Carotid artery disease Carotid duplex in October 2022 revealed 1 to 39% stenosis of left carotid, right carotid was not visualized due to central neck line.  No carotid bruit heard on exam. Would recommend repeating carotid Doppler at next follow-up visit or when clinically indicated.  4.  Hyperlipidemia Tolerating medication well.  Last LDL 48 in August 2023.  Continue Lipitor 80 mg daily.  5. Hypertension BP stable today at 132/60. Discussed to monitor BP at home at least 2 hours after medications and sitting for 5-10 minutes.  Discussed with her to notify our office if SBP is consistently greater than 140. Continue Lasix, hydralazine, and metoprolol.  6.  Pulmonary HTN Echo in 2022 revealed mildly elevated PASP. RV systolic function normal. Denies any symptoms or SHOB.  Discussed she needs to notify our office if she has worsening SHOB or new symptoms, and we will consider updating 2D echocardiogram at that time or when clinically indicated.  7. CKD stage 4, anemia of chronic disease Most recent serum creatinine 2.62, eGFR 19.  Last hemoglobin 9.4. Continue epogen as scheduled. Denies any new or worsening symptoms. Continue to follow-up with nephrology and PCP.  8. Frequent Falls, Brain Meningioma, Ischemic infarcts Chronic ongoing hx of frequent falls, not on any blood thinners. MRI of brain 05/2022 was negative for anything acute. 6 mm meningioma noted along anterior right front lobe. Symmetric foci of signal abnormality within the globus pallidus bilaterally, may likely reflect a prior toxic/metabolic insult or chronic lacunar infarcts. Mild chronic ischemic changes within cerebral white matter and  pons. Tiny chronic infarcts within left cerebellar hemisphere. Mild generalized parenchymal atrophy. Still continues to note falls. Continue to work with [facility]. Will place neurology referral.  10. Disposition: Follow-up with Dr. Carlyle Dolly in 4-6 months or sooner if anything changes.  Medication Adjustments/Labs and Tests Ordered: Current medicines are reviewed at length with the patient today.  Concerns regarding medicines are outlined above.  Orders Placed This Encounter  Procedures   Ambulatory referral to Neurology   No orders of the defined types were placed in this encounter.   Patient Instructions  Medication Instructions:  Continue all current medications.  Labwork: none  Testing/Procedures: none  Follow-Up: 6 months   Any Other Special Instructions Will Be Listed Below (If Applicable). You have been referred to Neurology  If you need a refill on your cardiac medications before your next appointment, please call your pharmacy.    Signed, Finis Bud, NP  11/03/2022 4:05 PM    [place]

## 2022-11-11 ENCOUNTER — Other Ambulatory Visit: Payer: Self-pay

## 2022-11-11 ENCOUNTER — Inpatient Hospital Stay (HOSPITAL_COMMUNITY)
Admission: EM | Admit: 2022-11-11 | Discharge: 2022-11-14 | DRG: 638 | Disposition: A | Discharge status: Skilled Nursing Facility | Payer: Medicare PPO | Attending: Family Medicine | Admitting: Family Medicine

## 2022-11-11 ENCOUNTER — Emergency Department (HOSPITAL_COMMUNITY): Payer: Medicare PPO

## 2022-11-11 ENCOUNTER — Encounter (HOSPITAL_COMMUNITY): Payer: Self-pay

## 2022-11-11 DIAGNOSIS — Z833 Family history of diabetes mellitus: Secondary | ICD-10-CM

## 2022-11-11 DIAGNOSIS — Z7401 Bed confinement status: Secondary | ICD-10-CM | POA: Diagnosis not present

## 2022-11-11 DIAGNOSIS — M109 Gout, unspecified: Secondary | ICD-10-CM | POA: Diagnosis present

## 2022-11-11 DIAGNOSIS — Z8673 Personal history of transient ischemic attack (TIA), and cerebral infarction without residual deficits: Secondary | ICD-10-CM

## 2022-11-11 DIAGNOSIS — E1140 Type 2 diabetes mellitus with diabetic neuropathy, unspecified: Secondary | ICD-10-CM | POA: Diagnosis present

## 2022-11-11 DIAGNOSIS — S7291XA Unspecified fracture of right femur, initial encounter for closed fracture: Secondary | ICD-10-CM | POA: Diagnosis present

## 2022-11-11 DIAGNOSIS — E1165 Type 2 diabetes mellitus with hyperglycemia: Secondary | ICD-10-CM

## 2022-11-11 DIAGNOSIS — R29898 Other symptoms and signs involving the musculoskeletal system: Secondary | ICD-10-CM | POA: Diagnosis not present

## 2022-11-11 DIAGNOSIS — Z043 Encounter for examination and observation following other accident: Secondary | ICD-10-CM | POA: Diagnosis not present

## 2022-11-11 DIAGNOSIS — K859 Acute pancreatitis without necrosis or infection, unspecified: Secondary | ICD-10-CM | POA: Diagnosis not present

## 2022-11-11 DIAGNOSIS — D75839 Thrombocytosis, unspecified: Secondary | ICD-10-CM | POA: Diagnosis present

## 2022-11-11 DIAGNOSIS — I10 Essential (primary) hypertension: Secondary | ICD-10-CM | POA: Diagnosis present

## 2022-11-11 DIAGNOSIS — Z79899 Other long term (current) drug therapy: Secondary | ICD-10-CM

## 2022-11-11 DIAGNOSIS — F32A Depression, unspecified: Secondary | ICD-10-CM | POA: Diagnosis present

## 2022-11-11 DIAGNOSIS — Z888 Allergy status to other drugs, medicaments and biological substances status: Secondary | ICD-10-CM

## 2022-11-11 DIAGNOSIS — R54 Age-related physical debility: Secondary | ICD-10-CM | POA: Diagnosis not present

## 2022-11-11 DIAGNOSIS — I672 Cerebral atherosclerosis: Secondary | ICD-10-CM | POA: Diagnosis not present

## 2022-11-11 DIAGNOSIS — D649 Anemia, unspecified: Secondary | ICD-10-CM | POA: Diagnosis not present

## 2022-11-11 DIAGNOSIS — N179 Acute kidney failure, unspecified: Secondary | ICD-10-CM | POA: Diagnosis present

## 2022-11-11 DIAGNOSIS — I6523 Occlusion and stenosis of bilateral carotid arteries: Secondary | ICD-10-CM | POA: Diagnosis not present

## 2022-11-11 DIAGNOSIS — S0990XA Unspecified injury of head, initial encounter: Secondary | ICD-10-CM | POA: Diagnosis not present

## 2022-11-11 DIAGNOSIS — E871 Hypo-osmolality and hyponatremia: Secondary | ICD-10-CM | POA: Diagnosis present

## 2022-11-11 DIAGNOSIS — W19XXXA Unspecified fall, initial encounter: Secondary | ICD-10-CM | POA: Diagnosis present

## 2022-11-11 DIAGNOSIS — R627 Adult failure to thrive: Secondary | ICD-10-CM | POA: Diagnosis present

## 2022-11-11 DIAGNOSIS — K861 Other chronic pancreatitis: Secondary | ICD-10-CM

## 2022-11-11 DIAGNOSIS — R7989 Other specified abnormal findings of blood chemistry: Secondary | ICD-10-CM | POA: Insufficient documentation

## 2022-11-11 DIAGNOSIS — Z7984 Long term (current) use of oral hypoglycemic drugs: Secondary | ICD-10-CM

## 2022-11-11 DIAGNOSIS — Z96641 Presence of right artificial hip joint: Secondary | ICD-10-CM | POA: Diagnosis present

## 2022-11-11 DIAGNOSIS — D631 Anemia in chronic kidney disease: Secondary | ICD-10-CM | POA: Diagnosis present

## 2022-11-11 DIAGNOSIS — Z885 Allergy status to narcotic agent status: Secondary | ICD-10-CM

## 2022-11-11 DIAGNOSIS — N185 Chronic kidney disease, stage 5: Secondary | ICD-10-CM

## 2022-11-11 DIAGNOSIS — K219 Gastro-esophageal reflux disease without esophagitis: Secondary | ICD-10-CM

## 2022-11-11 DIAGNOSIS — E782 Mixed hyperlipidemia: Secondary | ICD-10-CM | POA: Diagnosis present

## 2022-11-11 DIAGNOSIS — F419 Anxiety disorder, unspecified: Secondary | ICD-10-CM | POA: Diagnosis present

## 2022-11-11 DIAGNOSIS — I130 Hypertensive heart and chronic kidney disease with heart failure and stage 1 through stage 4 chronic kidney disease, or unspecified chronic kidney disease: Secondary | ICD-10-CM | POA: Diagnosis not present

## 2022-11-11 DIAGNOSIS — L89152 Pressure ulcer of sacral region, stage 2: Secondary | ICD-10-CM | POA: Diagnosis present

## 2022-11-11 DIAGNOSIS — E1122 Type 2 diabetes mellitus with diabetic chronic kidney disease: Secondary | ICD-10-CM | POA: Diagnosis present

## 2022-11-11 DIAGNOSIS — E1110 Type 2 diabetes mellitus with ketoacidosis without coma: Secondary | ICD-10-CM | POA: Diagnosis present

## 2022-11-11 DIAGNOSIS — I959 Hypotension, unspecified: Secondary | ICD-10-CM | POA: Diagnosis not present

## 2022-11-11 DIAGNOSIS — R269 Unspecified abnormalities of gait and mobility: Secondary | ICD-10-CM | POA: Diagnosis not present

## 2022-11-11 DIAGNOSIS — E875 Hyperkalemia: Secondary | ICD-10-CM

## 2022-11-11 DIAGNOSIS — M84451A Pathological fracture, right femur, initial encounter for fracture: Secondary | ICD-10-CM | POA: Diagnosis present

## 2022-11-11 DIAGNOSIS — I132 Hypertensive heart and chronic kidney disease with heart failure and with stage 5 chronic kidney disease, or end stage renal disease: Secondary | ICD-10-CM | POA: Diagnosis present

## 2022-11-11 DIAGNOSIS — Z881 Allergy status to other antibiotic agents status: Secondary | ICD-10-CM

## 2022-11-11 DIAGNOSIS — Z794 Long term (current) use of insulin: Secondary | ICD-10-CM

## 2022-11-11 DIAGNOSIS — S42211A Unspecified displaced fracture of surgical neck of right humerus, initial encounter for closed fracture: Secondary | ICD-10-CM | POA: Diagnosis not present

## 2022-11-11 DIAGNOSIS — Z7985 Long-term (current) use of injectable non-insulin antidiabetic drugs: Secondary | ICD-10-CM

## 2022-11-11 DIAGNOSIS — E1101 Type 2 diabetes mellitus with hyperosmolarity with coma: Secondary | ICD-10-CM | POA: Diagnosis not present

## 2022-11-11 DIAGNOSIS — Z91048 Other nonmedicinal substance allergy status: Secondary | ICD-10-CM

## 2022-11-11 DIAGNOSIS — S42211D Unspecified displaced fracture of surgical neck of right humerus, subsequent encounter for fracture with routine healing: Secondary | ICD-10-CM | POA: Diagnosis not present

## 2022-11-11 DIAGNOSIS — Z800 Family history of malignant neoplasm of digestive organs: Secondary | ICD-10-CM

## 2022-11-11 DIAGNOSIS — M47812 Spondylosis without myelopathy or radiculopathy, cervical region: Secondary | ICD-10-CM | POA: Diagnosis not present

## 2022-11-11 DIAGNOSIS — K5900 Constipation, unspecified: Secondary | ICD-10-CM | POA: Diagnosis present

## 2022-11-11 DIAGNOSIS — I2510 Atherosclerotic heart disease of native coronary artery without angina pectoris: Secondary | ICD-10-CM | POA: Diagnosis present

## 2022-11-11 DIAGNOSIS — R9082 White matter disease, unspecified: Secondary | ICD-10-CM | POA: Diagnosis not present

## 2022-11-11 DIAGNOSIS — S42214A Unspecified nondisplaced fracture of surgical neck of right humerus, initial encounter for closed fracture: Secondary | ICD-10-CM | POA: Diagnosis present

## 2022-11-11 DIAGNOSIS — I5032 Chronic diastolic (congestive) heart failure: Secondary | ICD-10-CM | POA: Diagnosis present

## 2022-11-11 DIAGNOSIS — R296 Repeated falls: Secondary | ICD-10-CM | POA: Diagnosis present

## 2022-11-11 DIAGNOSIS — Z882 Allergy status to sulfonamides status: Secondary | ICD-10-CM

## 2022-11-11 DIAGNOSIS — M6281 Muscle weakness (generalized): Secondary | ICD-10-CM | POA: Diagnosis not present

## 2022-11-11 DIAGNOSIS — R293 Abnormal posture: Secondary | ICD-10-CM | POA: Diagnosis not present

## 2022-11-11 DIAGNOSIS — Z886 Allergy status to analgesic agent status: Secondary | ICD-10-CM

## 2022-11-11 DIAGNOSIS — N184 Chronic kidney disease, stage 4 (severe): Secondary | ICD-10-CM | POA: Diagnosis not present

## 2022-11-11 LAB — COMPREHENSIVE METABOLIC PANEL
ALT: 14 U/L (ref 0–44)
AST: 18 U/L (ref 15–41)
Albumin: 3.5 g/dL (ref 3.5–5.0)
Alkaline Phosphatase: 172 U/L — ABNORMAL HIGH (ref 38–126)
Anion gap: 15 (ref 5–15)
BUN: 133 mg/dL — ABNORMAL HIGH (ref 8–23)
CO2: 19 mmol/L — ABNORMAL LOW (ref 22–32)
Calcium: 10 mg/dL (ref 8.9–10.3)
Chloride: 82 mmol/L — ABNORMAL LOW (ref 98–111)
Creatinine, Ser: 3.36 mg/dL — ABNORMAL HIGH (ref 0.44–1.00)
GFR, Estimated: 14 mL/min — ABNORMAL LOW (ref >60)
Glucose, Bld: 893 mg/dL (ref 70–99)
Potassium: 5.7 mmol/L — ABNORMAL HIGH (ref 3.5–5.1)
Sodium: 116 mmol/L — CL (ref 135–145)
Total Bilirubin: 0.7 mg/dL (ref 0.3–1.2)
Total Protein: 7.8 g/dL (ref 6.5–8.1)

## 2022-11-11 LAB — CBC WITH DIFFERENTIAL/PLATELET
Abs Immature Granulocytes: 0.4 10*3/uL — ABNORMAL HIGH (ref 0.00–0.07)
Basophils Absolute: 0.1 10*3/uL (ref 0.0–0.1)
Basophils Relative: 1 %
Eosinophils Absolute: 0.1 10*3/uL (ref 0.0–0.5)
Eosinophils Relative: 1 %
HCT: 31.4 % — ABNORMAL LOW (ref 36.0–46.0)
Hemoglobin: 10.4 g/dL — ABNORMAL LOW (ref 12.0–15.0)
Immature Granulocytes: 4 %
Lymphocytes Relative: 9 %
Lymphs Abs: 0.9 10*3/uL (ref 0.7–4.0)
MCH: 29.4 pg (ref 26.0–34.0)
MCHC: 33.1 g/dL (ref 30.0–36.0)
MCV: 88.7 fL (ref 80.0–100.0)
Monocytes Absolute: 0.5 10*3/uL (ref 0.1–1.0)
Monocytes Relative: 5 %
Neutro Abs: 8.1 10*3/uL — ABNORMAL HIGH (ref 1.7–7.7)
Neutrophils Relative %: 80 %
Platelets: 443 10*3/uL — ABNORMAL HIGH (ref 150–400)
RBC: 3.54 MIL/uL — ABNORMAL LOW (ref 3.87–5.11)
RDW: 14.5 % (ref 11.5–15.5)
WBC: 10.1 10*3/uL (ref 4.0–10.5)
nRBC: 0 % (ref 0.0–0.2)

## 2022-11-11 LAB — BASIC METABOLIC PANEL
Anion gap: 16 — ABNORMAL HIGH (ref 5–15)
BUN: 132 mg/dL — ABNORMAL HIGH (ref 8–23)
CO2: 14 mmol/L — ABNORMAL LOW (ref 22–32)
Calcium: 9.6 mg/dL (ref 8.9–10.3)
Chloride: 85 mmol/L — ABNORMAL LOW (ref 98–111)
Creatinine, Ser: 3.47 mg/dL — ABNORMAL HIGH (ref 0.44–1.00)
GFR, Estimated: 14 mL/min — ABNORMAL LOW (ref >60)
Glucose, Bld: 895 mg/dL (ref 70–99)
Potassium: 5.9 mmol/L — ABNORMAL HIGH (ref 3.5–5.1)
Sodium: 115 mmol/L — CL (ref 135–145)

## 2022-11-11 LAB — BLOOD GAS, VENOUS
Acid-base deficit: 7 mmol/L — ABNORMAL HIGH (ref 0.0–2.0)
Bicarbonate: 18.6 mmol/L — ABNORMAL LOW (ref 20.0–28.0)
Drawn by: 442
FIO2: 21 %
O2 Saturation: 58.5 %
Patient temperature: 36.4
pCO2, Ven: 36 mmHg — ABNORMAL LOW (ref 44–60)
pH, Ven: 7.32 (ref 7.25–7.43)
pO2, Ven: 36 mmHg (ref 32–45)

## 2022-11-11 LAB — CBG MONITORING, ED
Glucose-Capillary: >600 mg/dL (ref 70–99)
Glucose-Capillary: >600 mg/dL (ref 70–99)
Glucose-Capillary: >600 mg/dL (ref 70–99)
Glucose-Capillary: >600 mg/dL (ref 70–99)
Glucose-Capillary: >600 mg/dL (ref 70–99)
Glucose-Capillary: >600 mg/dL (ref 70–99)

## 2022-11-11 LAB — BETA-HYDROXYBUTYRIC ACID: Beta-Hydroxybutyric Acid: 0.85 mmol/L — ABNORMAL HIGH (ref 0.05–0.27)

## 2022-11-11 MED ORDER — LACTATED RINGERS IV SOLN: INTRAVENOUS | Status: DC

## 2022-11-11 MED ORDER — INSULIN REGULAR(HUMAN) IN NACL 100-0.9 UT/100ML-% IV SOLN
INTRAVENOUS | Status: DC
  Administered 2022-11-11: 5.5 [IU]/h via INTRAVENOUS
  Filled 2022-11-11 (×2): qty 100

## 2022-11-11 MED ORDER — DEXTROSE 50 % IV SOLN: 0.0000 mL | INTRAVENOUS | Status: DC | PRN

## 2022-11-11 MED ORDER — SODIUM CHLORIDE 0.9 % IV SOLN: INTRAVENOUS | Status: DC

## 2022-11-11 MED ORDER — DEXTROSE IN LACTATED RINGERS 5 % IV SOLN: INTRAVENOUS | Status: DC

## 2022-11-11 NOTE — ED Provider Notes (Addendum)
[facility] EMERGENCY DEPARTMENT Provider Note   CSN: [id number] Arrival date & time: 11/11/22  1600     History  Chief Complaint  Patient presents with   [name]    Heather [name] is a 72 y.o. female.   Fall  This patient is a 72 year old female, she has a history of diabetes on Farxiga, insulin and Ozempic, she also has a history of some chronic imbalance, frequent falls and generalized weakness.  She has had problems with falls in the past and has actually fallen and broken her right shoulder.  She comes in with increasing pain in that right arm and in fact has a recurrent acute on chronic fracture of the proximal humerus.  She had fallen several days ago and struck the left side of her head, she reports that her blood sugars have been continuing to rise, she is feeling increasingly weak and dry in the mouth and is now nauseated.     Home Medications Prior to Admission medications   Medication Sig Start Date End Date Taking? Authorizing Provider  acetaminophen (TYLENOL) 325 MG tablet Take 2 tablets (650 mg total) by mouth every 4 (four) hours. Patient taking differently: Take 650 mg by mouth every 4 (four) hours as needed for mild pain, moderate pain or fever. 12/01/21   Johnson, Clanford L, MD  allopurinol (ZYLOPRIM) 100 MG tablet Take 100 mg by mouth daily. 03/20/21   [provider]  atorvastatin (LIPITOR) 80 MG tablet Take 1 tablet (80 mg total) by mouth daily. Restart on 06/29/22 06/22/22   Orson Eva, MD  diphenhydrAMINE (BENADRYL) 2 % cream Apply topically 3 (three) times daily as needed for itching. 12/01/21   Johnson, Clanford L, MD  epoetin alfa (EPOGEN) 3000 UNIT/ML injection Inject 3,000 Units into the skin once a week. 03/01/22   [provider]  esomeprazole (NEXIUM) 40 MG capsule Take 40 mg by mouth daily before breakfast.      [provider]  FARXIGA 10 MG TABS tablet Take 10 mg by mouth daily. 07/11/22   [provider]  furosemide  (LASIX) 40 MG tablet Take 1 tablet (40 mg total) by mouth daily. 06/22/22   Orson Eva, MD  hydrALAZINE (APRESOLINE) 50 MG tablet Take 1 tablet (50 mg total) by mouth every 8 (eight) hours. 10/21/22 11/20/22  Manuella Ghazi, Pratik D, DO  insulin aspart (NOVOLOG) 100 UNIT/ML FlexPen Inject 5-11 Units into the skin 3 (three) times daily with meals. 07/09/22   Brita Romp, NP  insulin detemir (LEVEMIR FLEXPEN) 100 UNIT/ML FlexPen Inject 20 Units into the skin at bedtime. 07/10/22   Brita Romp, NP  levETIRAcetam (KEPPRA) 500 MG tablet Take 500 mg by mouth 2 (two) times daily.    [provider]  lipase/protease/amylase (CREON) 36000 UNITS CPEP capsule Take 36,000 Units by mouth 3 (three) times daily with meals.    [provider]  metoprolol succinate (TOPROL-XL) 25 MG 24 hr tablet Take 1 tablet (25 mg total) by mouth daily. 10/21/22 11/20/22  ShahmehdiValeria Batman, MD  naloxone [facility]) nasal spray 4 mg/0.1 mL Place 0.4 mg into the nose once. 09/30/22   [provider]  oxyCODONE-acetaminophen (PERCOCET/ROXICET) 5-325 MG tablet Take 1 pill every 6 hours for pain has not relieved by Tylenol or Motrin alone 08/20/22   Milton Ferguson, MD  OZEMPIC, 1 MG/DOSE, 4 MG/3ML SOPN Inject 1 mg into the skin once a week. 09/30/22   [provider]  potassium chloride SA (KLOR-CON M) 20  MEQ tablet Take 20 mEq by mouth 3 (three) times a week. 07/11/22   [provider]  tiZANidine (ZANAFLEX) 4 MG tablet Take 4 mg by mouth 2 (two) times daily. 06/14/22   [provider]  venlafaxine XR (EFFEXOR-XR) 150 MG 24 hr capsule Take 150 mg by mouth daily. 07/11/22   [provider]  Vitamin D, Ergocalciferol, (DRISDOL) 1.25 MG (50000 UNIT) CAPS capsule Take 50,000 Units by mouth once a week. 07/09/22   [provider]  ziprasidone (GEODON) 20 MG capsule Take 20 mg by mouth 2 (two) times daily. 06/09/22   [provider]      Allergies    Cozaar [losartan],  Quinolones, Cymbalta [duloxetine hcl], Sulfa antibiotics, Synalar [fluocinolone], Cipro [ciprofloxacin hcl], Diovan [valsartan], Glucophage [metformin], Ketalar [ketamine], Norco [hydrocodone-acetaminophen], Norvasc [amlodipine besylate], Nsaids, Tape, and Zestril [lisinopril]    Review of Systems   Review of Systems  All other systems reviewed and are negative.   Physical Exam Updated Vital Signs BP (!) 119/56 (BP Location: Left Arm)   Pulse 73   Temp (!) 97.5 F (36.4 C) (Oral)   Resp 16   Ht 1.626 m ('5\' 4"'$ )   Wt 59 kg   LMP  (LMP Unknown)   SpO2 100%   BMI 22.31 kg/m  Physical Exam Vitals and nursing note reviewed.  Constitutional:      General: She is not in acute distress.    Appearance: She is well-developed.  HENT:     Head: Normocephalic.     Comments: Bruising around the left forehead    Mouth/Throat:     Pharynx: No oropharyngeal exudate.  Eyes:     General: No scleral icterus.       Right eye: No discharge.        Left eye: No discharge.     Conjunctiva/sclera: Conjunctivae normal.     Pupils: Pupils are equal, round, and reactive to light.  Neck:     Thyroid: No thyromegaly.     Vascular: No JVD.  Cardiovascular:     Rate and Rhythm: Normal rate and regular rhythm.     Heart sounds: Normal heart sounds. No murmur heard.    No friction rub. No gallop.  Pulmonary:     Effort: Pulmonary effort is normal. No respiratory distress.     Breath sounds: Normal breath sounds. No wheezing or rales.  Chest:     Chest wall: No tenderness.  Abdominal:     General: Bowel sounds are normal. There is no distension.     Palpations: Abdomen is soft. There is no mass.     Tenderness: There is no abdominal tenderness.  Musculoskeletal:        General: Swelling and tenderness present.     Cervical back: Normal range of motion and neck supple.     Right lower leg: No edema.     Left lower leg: No edema.     Comments: Pain and mild swelling around the right proximal  humerus  Lymphadenopathy:     Cervical: No cervical adenopathy.  Skin:    General: Skin is warm and dry.     Findings: No erythema or rash.  Neurological:     Mental Status: She is alert.     Coordination: Coordination normal.  Psychiatric:        Behavior: Behavior normal.     ED Results / Procedures / Treatments   Labs (all labs ordered are listed, but only abnormal results are displayed)  Labs Reviewed  COMPREHENSIVE METABOLIC PANEL - Abnormal; Notable for the following components:      Result Value   Sodium 116 (*)    Potassium 5.7 (*)    Chloride 82 (*)    CO2 19 (*)    Glucose, Bld 893 (*)    BUN 133 (*)    Creatinine, Ser 3.36 (*)    Alkaline Phosphatase 172 (*)    GFR, Estimated 14 (*)    All other components within normal limits  CBC WITH DIFFERENTIAL/PLATELET - Abnormal; Notable for the following components:   RBC 3.54 (*)    Hemoglobin 10.4 (*)    HCT 31.4 (*)    Platelets 443 (*)    Neutro Abs 8.1 (*)    Abs Immature Granulocytes 0.40 (*)    All other components within normal limits  URINE CULTURE  URINALYSIS, ROUTINE W REFLEX MICROSCOPIC  BASIC METABOLIC PANEL  BETA-HYDROXYBUTYRIC ACID  BLOOD GAS, ARTERIAL  CBG MONITORING, ED    EKG None  Radiology CT Head Wo Contrast  Result Date: 11/11/2022 CLINICAL DATA:  Head trauma. Blunt. Fall yesterday with bruising to left side of head. EXAM: CT HEAD WITHOUT CONTRAST TECHNIQUE: Contiguous axial images were obtained from the base of the skull through the vertex without intravenous contrast. RADIATION DOSE REDUCTION: This exam was performed according to the departmental dose-optimization program which includes automated exposure control, adjustment of the mA and/or kV according to patient size and/or use of iterative reconstruction technique. COMPARISON:  CT head without contrast 10/15/2022 FINDINGS: Brain: Asymmetric hyperdensity within the left lentiform nucleus is similar the prior exam. No significant  right-sided density is present. No acute hemorrhage contusion is evident. No acute infarct is present. Moderate generalized atrophy and white matter disease is similar the prior study. The ventricles are proportionate to the degree of atrophy. No significant extraaxial fluid collection is present. The brainstem and cerebellum are within normal limits. Midline structures are within normal limits. Vascular: Dense atherosclerotic calcifications are present within the cavernous internal carotid arteries and at the dural margin of both vertebral arteries. No hyperdense vessel is present. Skull: Calvarium is intact. No focal lytic or blastic lesions are present. No significant extracranial soft tissue lesion is present. Sinuses/Orbits: The paranasal sinuses and mastoid air cells are clear. Bilateral lens replacements are noted. Globes and orbits are otherwise unremarkable. IMPRESSION: 1. No acute intracranial abnormality or significant interval change. 2. Stable moderate generalized atrophy and white matter disease. This likely reflects the sequela of chronic microvascular ischemia. 3. Persistent asymmetric density within the left lentiform nucleus. This has been described in the setting of nonketotic hyperglycemic hemichorea. This would be consistent with the patient's history of hyperglycemia. The finding typically resolves over time. Electronically Signed   By: San Morelle M.D.   On: 11/11/2022 18:23   CT Cervical Spine Wo Contrast  Result Date: 11/11/2022 CLINICAL DATA:  Fall EXAM: CT CERVICAL SPINE WITHOUT CONTRAST TECHNIQUE: Multidetector CT imaging of the cervical spine was performed without intravenous contrast. Multiplanar CT image reconstructions were also generated. RADIATION DOSE REDUCTION: This exam was performed according to the departmental dose-optimization program which includes automated exposure control, adjustment of the mA and/or kV according to patient size and/or use of iterative  reconstruction technique. COMPARISON:  10/15/2022 FINDINGS: Alignment: No subluxation Skull base and vertebrae: No acute fracture. No primary bone lesion or focal pathologic process. Soft tissues and spinal canal: No prevertebral fluid or swelling. No visible canal hematoma. Disc levels: Prior fusion throughout the cervical  spine from C3 to T1. Anterior plate remain in place at C3-4 and C7-T1. Posterior fusion at C7-T1. Upper chest: No acute findings Other: None IMPRESSION: Postoperative and degenerative changes.  No acute bony abnormality. Electronically Signed   By: Rolm Baptise M.D.   On: 11/11/2022 18:15   DG Humerus Right  Result Date: 11/11/2022 CLINICAL DATA:  Trauma EXAM: RIGHT HUMERUS - 2+ VIEW COMPARISON:  10/15/2022 FINDINGS: Right humeral neck fracture again noted. There is difference in alignment of the humeral head and humeral shaft when compared to prior study suggesting re-injury or new fracture superimposed on subacute to chronic fracture. No subluxation or dislocation. IMPRESSION: Right humeral neck fracture, likely new fracture superimposed on subacute to chronic fracture. Electronically Signed   By: Rolm Baptise M.D.   On: 11/11/2022 18:04    Procedures .Critical Care  Performed by: Noemi Chapel, MD Authorized by: Noemi Chapel, MD   Critical care provider statement:    Critical care time (minutes):  45   Critical care time was exclusive of:  Separately billable procedures and treating other patients and teaching time   Critical care was necessary to treat or prevent imminent or life-threatening deterioration of the following conditions:  Metabolic crisis, renal failure and trauma   Critical care was time spent personally by me on the following activities:  Development of treatment plan with patient or surrogate, discussions with consultants, evaluation of patient's response to treatment, examination of patient, obtaining history from patient or surrogate, review of old charts,  re-evaluation of patient's condition, pulse oximetry, ordering and review of radiographic studies, ordering and review of laboratory studies and ordering and performing treatments and interventions   I assumed direction of critical care for this patient from another provider in my specialty: no     Care discussed with: admitting provider   Comments:       Ultrasound ED Peripheral IV (Provider)  Date/Time: 11/11/2022 9:48 PM  Performed by: Noemi Chapel, MD Authorized by: Noemi Chapel, MD   Procedure details:    Indications: hydration, multiple failed IV attempts and poor IV access     Skin Prep: chlorhexidine gluconate     Location: right upper arm.   Bedside Ultrasound Guided: Yes     Images: not archived     Patient tolerated procedure without complications: Yes     Dressing applied: Yes   Comments:           Medications Ordered in ED Medications  0.9 %  sodium chloride infusion (has no administration in time range)  insulin regular, human (MYXREDLIN) 100 units/ 100 mL infusion (has no administration in time range)  lactated ringers infusion (has no administration in time range)  dextrose 5 % in lactated ringers infusion (has no administration in time range)  dextrose 50 % solution 0-50 mL (has no administration in time range)    ED Course/ Medical Decision Making/ A&P                             Medical Decision Making Amount and/or Complexity of Data Reviewed Labs: ordered. Radiology: ordered. ECG/medicine tests: ordered.  Risk Prescription drug management. Decision regarding hospitalization.   This patient presents to the ED for concern of increasing weakness, nausea, pain in her shoulder as well as frequent falls, this involves an extensive number of treatment options, and is a complaint that carries with it a high risk of complications and morbidity.  The differential diagnosis  includes hyperglycemia, hypernatremia, hyponatremia, hypoglycemia, hypokalemia,  infection, intracranial abnormality such as hemorrhage or stroke   Co morbidities that complicate the patient evaluation  Chronic diabetes   Additional history obtained:  Additional history obtained from electronic medical record External records from outside source obtained and reviewed including the patient was admitted to the hospital with hyperkalemia and an acute kidney injury in December 2023, also admitted for syncope about 3 months ago  Lab Tests:  I Ordered, and personally interpreted labs.  The pertinent results include: Severe hyperglycemia, 893, potassium of 5.7 and a sodium of 116.  Creatinine is 3.36, this is above baseline but not by much.  BUN is 133 consistent with severe uremia and likely cause of the patient's increasing weakness and fatigue and falls   Imaging Studies ordered:  I ordered imaging studies including CT scan of the brain as well as plain film of the humerus.  There does appear to be an acute on chronic fracture of the proximal humerus on the right I independently visualized and interpreted imaging which showed fracture of humerus, atraumatic brain and spine I agree with the radiologist interpretation   Cardiac Monitoring: / EKG:  The patient was maintained on a cardiac monitor.  I personally viewed and interpreted the cardiac monitored which showed an underlying rhythm of: Normal sinus rhythm   Consultations Obtained:  I requested consultation with the hospitalist,  and discussed lab and imaging findings as well as pertinent plan - they recommend: Admission to the hospital   Problem List / ED Course / Critical interventions / Medication management  Severe hyperglycemia placed on the hyperglycemia protocol with insulin drip, the hyponatremia is likely secondary to the hyperglycemia and it actually adjusted between 129 and 135 according to the [employer] and [employer] I ordered medication including an IV fluids for acute kidney injury, uremia  and hyperglycemia Reevaluation of the patient after these medicines showed that the patient critically ill I have reviewed the patients home medicines and have made adjustments as needed   Social Determinants of Health:  Severe diabetic   Test / Admission - Considered:  Will need to be admitted to higher level of care         Final Clinical Impression(s) / ED Diagnoses Final diagnoses:  Severe hyperglycemia due to diabetes mellitus ([place])  Hyponatremia  Closed nondisplaced fracture of surgical neck of right humerus, unspecified fracture morphology, initial encounter    Rx / DC Orders ED Discharge Orders     None         Noemi Chapel, MD 11/11/22 1943    Noemi Chapel, MD 11/11/22 2148

## 2022-11-11 NOTE — ED Notes (Signed)
Ok to discontinue shoulder sling at this time for patient care per Dr Sabra Heck

## 2022-11-11 NOTE — ED Provider Triage Note (Signed)
Emergency Medicine Provider Triage Evaluation Note  AMARIONA RATHJE , a 72 y.o. female  was evaluated in triage.  Pt complains of chronic abd pain -as well as frequent falls, has had this over time, is being followed by family doctor Dr. Brigitte Pulse in  [place], she states she was told to come to the ER whenever her pain gets worse.  She reports that she fell approximately 5 days ago and hit the left side of her head on the ground, no loss of consciousness, she is having chronic neck pain, chronic abdominal pain, her right shoulder hurts after clavicle fracture in the past.  Review of Systems  Positive: Multiple areas of pain, bruising chronic nausea vomiting and diarrhea, poor appetite Negative: Fevers or chills  Physical Exam  BP (!) 119/56 (BP Location: Left Arm)   Pulse 73   Temp (!) 97.5 F (36.4 C) (Oral)   Resp 16   Ht 1.626 m ('5\' 4"'$ )   Wt 59 kg   LMP  (LMP Unknown)   SpO2 100%   BMI 22.31 kg/m  Gen:   Awake, no distress   Resp:  Normal effort  MSK:   Moves extremities without difficulty, except for the right upper extremity with tenderness around the right shoulder Other:  Head exam with bruising over the left forehead, this is already fading, there is no bruising around the eye  Medical Decision Making  Medically screening exam initiated at 5:36 PM.  Appropriate orders placed.  RAUSHANAH OSMUNDSON was informed that the remainder of the evaluation will be completed by another provider, this initial triage assessment does not replace that evaluation, and the importance of remaining in the ED until their evaluation is complete.  Needs workup for minor trauma, could have some element of electrolyte abnormalities causing weakness, does have frequent urination   Noemi Chapel, MD 11/11/22 2147

## 2022-11-11 NOTE — H&P (Signed)
History and Physical    Patient: Heather [name] LKJ:[id number] DOB: 01-Jun-1951 DOA: 11/11/2022 DOS: the patient was seen and examined on 11/12/2022 PCP: [name], MD  Patient coming from: Home  Chief Complaint:  Chief Complaint  Patient presents with   Fall   HPI: Heather [name] is a 72 y.o. female with medical history significant of hypertension, uncontrolled type 2 diabetes mellitus with hyperglycemia, CKD stage IV, anxiety who presents to the emergency department due to frequent falls, nausea and generalized weakness.  Patient states that she fell 2 days ago and yesterday and struck left side of face, she has had a right femoral fracture due to sustained falls.  She complains of recurrent pain in the right which was due to acute on chronic fracture of the proximal humerus.  She complained of blood glucose levels that continue to increase and endorsed dry mouth.  She denies fever, chills, chest pain, shortness of breath  ED Course:  In the emergency department, she was intermittently tachypneic, BP was 189/84, other vital signs are within normal range.  Workup in the ED showed normocytic anemia, thrombocytosis.  BMP showed sodium 116, potassium 5.7, chloride 82, bicarb 19, BUN 133, creatinine 3.36, EGFR 14, blood glucose 893. CT head without contrast showed no acute intracranial abnormality or significant interval change CT cervical spine without contrast showed no acute bony abnormality Right humerus showed right femoral neck fracture, likely new fracture superimposed on subacute or chronic fracture. Patient was started on insulin drip per Endo tool, IV hydration was started.  Hospitalist was asked admit patient for further evaluation and management.  Review of Systems: Review of systems as noted in the HPI. All other systems reviewed and are negative.   Past Medical History:  Diagnosis Date   Acute renal failure superimposed on stage 3 chronic kidney disease ([place])  01/21/2016   Anemia    Anxiety    B12 deficiency    Brain tumor (benign) ([place]) 10/29/2020   Cellulitis and abscess    Abdomen and buttocks   Chronic diastolic CHF (congestive heart failure) ([place]) 03/16/2022   CKD (chronic kidney disease) stage 3, GFR 30-59 ml/min (HCC)    Closed fracture of distal end of femur, unspecified fracture morphology, initial encounter ([place])    Coronary arteriosclerosis 07/15/2018   Crohn disease ([place])    Depression    Diabetic neuropathy (HCC)    feet   DJD (degenerative joint disease)    Right forminal stenosis C4-5   Essential hypertension    Femur fracture, right ([place]) 79/48/0165   Folliculitis    GERD (gastroesophageal reflux disease)    Gout    Headache(784.0)    Hiatal hernia    History of blood transfusion    History of bronchitis    History of cardiac catheterization    No significant CAD 2012   History of kidney stones    surgery to remove   Insomnia    Irritable bowel syndrome    Mixed hyperlipidemia    Nausea & vomiting 12/22/2014   Osteoarthritis    Palpitations    Pneumonia    Reflux esophagitis    Salmonella    Stroke [facility])    Tinnitus    Right   Type 2 diabetes mellitus ([place])    Past Surgical History:  Procedure Laterality Date   BACK SURGERY     c-spine surgery     03/2007   CARDIAC CATHETERIZATION     CHOLECYSTECTOMY     COLONOSCOPY  04/21/2011; 05/29/11   6/12: [place] - ?AVM at IC valve, inflammatory changes at IC valve [facility]); 7/12 - [facility]; IC valve erosions, look chronic and probably Crohn's per path   colonoscopy  2017   [facility]: random biopsies normal. TI normal   ESOPHAGOGASTRODUODENOSCOPY  05/29/11   normal   ESOPHAGOGASTRODUODENOSCOPY N/A 01/21/2016   Dr. Michail Sermon: normal   EYE SURGERY Bilateral    cataracts removed   FEMUR IM NAIL Right 04/10/2018   Procedure: INTRAMEDULLARY (IM) NAIL FEMORAL;  Surgeon: Paralee Cancel, MD;  Location: [facility];  Service: Orthopedics;  Laterality: Right;    FRACTURE SURGERY     GIVENS CAPSULE STUDY  11/17/2020   HARDWARE REMOVAL Right 04/10/2018   Procedure: HARDWARE REMOVAL RIGHT DISTAL FEMUR;  Surgeon: Paralee Cancel, MD;  Location: [facility];  Service: Orthopedics;  Laterality: Right;   HERNIA REPAIR     IR FLUORO GUIDE CV LINE RIGHT  04/01/2017   IR US GUIDE VASC ACCESS RIGHT  04/01/2017   JOINT REPLACEMENT Right    hip   OPEN REDUCTION INTERNAL FIXATION (ORIF) DISTAL RADIAL FRACTURE Right 05/28/2019   Procedure: OPEN REDUCTION INTERNAL FIXATION (ORIF) DISTAL RADIAL FRACTURE;  Surgeon: Verner Mould, MD;  Location: [place];  Service: Orthopedics;  Laterality: Right;  with block 90 minutes   ORIF FEMUR FRACTURE Right 03/31/2017   Procedure: OPEN REDUCTION INTERNAL FIXATION (ORIF) DISTAL FEMUR FRACTURE;  Surgeon: Paralee Cancel, MD;  Location: [place];  Service: Orthopedics;  Laterality: Right;   removal of kidney stone     Right knee arthroscopic surgery     TONSILLECTOMY     TOTAL ABDOMINAL HYSTERECTOMY      Social History:  reports that she has never smoked. She has never used smokeless tobacco. She reports that she does not drink alcohol and does not use drugs.   Allergies  Allergen Reactions   Cozaar [Losartan] Nausea And Vomiting, Swelling and Rash   Quinolones Itching, Nausea And Vomiting, Nausea Only and Swelling    Swelling of face, jaw, and lips    Cymbalta [Duloxetine Hcl] Itching and Swelling   Sulfa Antibiotics Nausea And Vomiting and Swelling    Headache  Swelling of feet, legs    Synalar [Fluocinolone] Swelling   Cipro [Ciprofloxacin Hcl] Nausea And Vomiting and Swelling    Swelling of face, jaw, lips   Diovan [Valsartan] Nausea And Vomiting and Swelling   Glucophage [Metformin] Other (See Comments)    GI upset   Ketalar [Ketamine] Swelling   Norco [Hydrocodone-Acetaminophen] Itching and Swelling   Norvasc [Amlodipine Besylate] Itching and Swelling    Fluid retention    Nsaids Nausea And Vomiting and Other (See  Comments)    Has bleeding ulcers   Tape Itching and Rash    Paper tape can only be used on this patient   Zestril [Lisinopril] Swelling and Rash    Oral swelling Red streaks on arms/legs    Family History  Problem Relation Age of Onset   Diabetes Mother    Colon cancer Brother        POSSIBLY. Patient states diagnosed at age 89, then later states age 82. unclear and limited historian   Esophageal cancer Neg Hx    Rectal cancer Neg Hx    Stomach cancer Neg Hx      Prior to Admission medications   Medication Sig Start Date End Date Taking? Authorizing Provider  acetaminophen (TYLENOL) 325 MG tablet Take 2 tablets (650 mg total) by mouth every  4 (four) hours. Patient taking differently: Take 650 mg by mouth every 4 (four) hours as needed for mild pain, moderate pain or fever. 12/01/21  Yes Johnson, Clanford L, MD  allopurinol (ZYLOPRIM) 100 MG tablet Take 100 mg by mouth daily. 03/20/21  Yes [provider]  atorvastatin (LIPITOR) 80 MG tablet Take 1 tablet (80 mg total) by mouth daily. Restart on 06/29/22 06/22/22  Yes Tat, Shanon Brow, MD  diphenhydrAMINE (BENADRYL) 2 % cream Apply topically 3 (three) times daily as needed for itching. 12/01/21  Yes Johnson, Clanford L, MD  epoetin alfa (EPOGEN) 3000 UNIT/ML injection Inject 3,000 Units into the skin once a week. 03/01/22  Yes [provider]  esomeprazole (NEXIUM) 40 MG capsule Take 40 mg by mouth daily before breakfast.     Yes [provider]  FARXIGA 10 MG TABS tablet Take 10 mg by mouth daily. 07/11/22  Yes [provider]  furosemide (LASIX) 40 MG tablet Take 1 tablet (40 mg total) by mouth daily. 06/22/22  Yes Tat, Shanon Brow, MD  glipiZIDE (GLUCOTROL) 10 MG tablet Take 10 mg by mouth daily. 09/30/22  Yes [provider]  hydrALAZINE (APRESOLINE) 50 MG tablet Take 1 tablet (50 mg total) by mouth every 8 (eight) hours. 10/21/22 11/20/22 Yes Shah, Pratik D, DO  insulin aspart (NOVOLOG) 100 UNIT/ML FlexPen  Inject 5-11 Units into the skin 3 (three) times daily with meals. 07/09/22  Yes Reardon, Loree Fee J, NP  insulin detemir (LEVEMIR FLEXPEN) 100 UNIT/ML FlexPen Inject 20 Units into the skin at bedtime. 07/10/22  Yes Brita Romp, NP  levETIRAcetam (KEPPRA) 500 MG tablet Take 500 mg by mouth 2 (two) times daily.   Yes [provider]  lipase/protease/amylase (CREON) 36000 UNITS CPEP capsule Take 36,000 Units by mouth 3 (three) times daily with meals.   Yes [provider]  metoprolol succinate (TOPROL-XL) 25 MG 24 hr tablet Take 1 tablet (25 mg total) by mouth daily. 10/21/22 11/20/22 Yes Shahmehdi, Valeria Batman, MD  naloxone [facility]) nasal spray 4 mg/0.1 mL Place 0.4 mg into the nose once. 09/30/22  Yes [provider]  spironolactone (ALDACTONE) 25 MG tablet Take 25 mg by mouth daily. 10/31/22  Yes [provider]  venlafaxine XR (EFFEXOR-XR) 150 MG 24 hr capsule Take 150 mg by mouth daily. 07/11/22  Yes [provider]  Vitamin D, Ergocalciferol, (DRISDOL) 1.25 MG (50000 UNIT) CAPS capsule Take 50,000 Units by mouth once a week. 07/09/22  Yes [provider]  OZEMPIC, 1 MG/DOSE, 4 MG/3ML SOPN Inject 1 mg into the skin once a week. Patient not taking: Reported on 11/11/2022 09/30/22   [provider]  potassium chloride SA (KLOR-CON M) 20 MEQ tablet Take 20 mEq by mouth 3 (three) times a week. Patient not taking: Reported on 11/11/2022 07/11/22   [provider]  tiZANidine (ZANAFLEX) 4 MG tablet Take 4 mg by mouth 2 (two) times daily. Patient not taking: Reported on 11/11/2022 06/14/22   [provider]  ziprasidone (GEODON) 20 MG capsule Take 20 mg by mouth 2 (two) times daily. Patient not taking: Reported on 11/11/2022 06/09/22   [provider]    Physical Exam: BP (!) 166/63   Pulse 80   Temp 98.1 F (36.7 C) (Oral)   Resp (!) 21   Ht '5\' 4"'$  (1.626 m)   Wt 59 kg   LMP  (LMP Unknown)   SpO2 97%   BMI 22.31 kg/m    General: 72 y.o. year-old female  well developed well nourished in no acute distress.  Alert and oriented x3. HEENT: Noted bruise in the left side of forehead, EOMI, dry mucous membrane Neck: Supple, trachea medial Cardiovascular: Regular rate and rhythm with no rubs or gallops.  No thyromegaly or JVD noted.  No lower extremity edema. 2/4 pulses in all 4 extremities. Respiratory: Clear to auscultation with no wheezes or rales. Good inspiratory effort. Abdomen: Soft, nontender nondistended with normal bowel sounds x4 quadrants. Muskuloskeletal: Tender to palpation of right proximal humerus.  Right arm in a sling.   Neuro: CN II-XII intact, sensation, reflexes intact Skin: Warm and dry Psychiatry: Judgement and insight appear normal. Mood is appropriate for condition and setting          Labs on Admission:  Basic Metabolic Panel: Recent Labs  Lab 11/11/22 1818 11/11/22 2012  NA 116* 115*  K 5.7* 5.9*  CL 82* 85*  CO2 19* 14*  GLUCOSE 893* 895*  BUN 133* 132*  CREATININE 3.36* 3.47*  CALCIUM 10.0 9.6   Liver Function Tests: Recent Labs  Lab 11/11/22 1818  AST 18  ALT 14  ALKPHOS 172*  BILITOT 0.7  PROT 7.8  ALBUMIN 3.5   No results for input(s): "LIPASE", "AMYLASE" in the last 168 hours. No results for input(s): "AMMONIA" in the last 168 hours. CBC: Recent Labs  Lab 11/11/22 1818  WBC 10.1  NEUTROABS 8.1*  HGB 10.4*  HCT 31.4*  MCV 88.7  PLT 443*   Cardiac Enzymes: No results for input(s): "CKTOTAL", "CKMB", "CKMBINDEX", "TROPONINI" in the last 168 hours.  BNP (last 3 results) No results for input(s): "BNP" in the last 8760 hours.  ProBNP (last 3 results) No results for input(s): "PROBNP" in the last 8760 hours.  CBG: Recent Labs  Lab 11/11/22 2230 11/11/22 2307 11/12/22 0000 11/12/22 0049 11/12/22 0131  GLUCAP >600* >600* 567* 585* 456*    Radiological Exams on Admission: CT Head Wo Contrast  Result Date: 11/11/2022 CLINICAL DATA:  Head  trauma. Blunt. Fall yesterday with bruising to left side of head. EXAM: CT HEAD WITHOUT CONTRAST TECHNIQUE: Contiguous axial images were obtained from the base of the skull through the vertex without intravenous contrast. RADIATION DOSE REDUCTION: This exam was performed according to the departmental dose-optimization program which includes automated exposure control, adjustment of the mA and/or kV according to patient size and/or use of iterative reconstruction technique. COMPARISON:  CT head without contrast 10/15/2022 FINDINGS: Brain: Asymmetric hyperdensity within the left lentiform nucleus is similar the prior exam. No significant right-sided density is present. No acute hemorrhage contusion is evident. No acute infarct is present. Moderate generalized atrophy and white matter disease is similar the prior study. The ventricles are proportionate to the degree of atrophy. No significant extraaxial fluid collection is present. The brainstem and cerebellum are within normal limits. Midline structures are within normal limits. Vascular: Dense atherosclerotic calcifications are present within the cavernous internal carotid arteries and at the dural margin of both vertebral arteries. No hyperdense vessel is present. Skull: Calvarium is intact. No focal lytic or blastic lesions are present. No significant extracranial soft tissue lesion is present. Sinuses/Orbits: The paranasal sinuses and mastoid air cells are clear. Bilateral lens replacements are noted. Globes and orbits are otherwise unremarkable. IMPRESSION: 1. No acute intracranial abnormality or significant interval change. 2. Stable moderate generalized atrophy and white matter disease. This likely reflects the sequela of chronic microvascular ischemia. 3. Persistent asymmetric density within the left lentiform nucleus. This has been described in the  setting of nonketotic hyperglycemic hemichorea. This would be consistent with the patient's history of  hyperglycemia. The finding typically resolves over time. Electronically Signed   By: San Morelle M.D.   On: 11/11/2022 18:23   CT Cervical Spine Wo Contrast  Result Date: 11/11/2022 CLINICAL DATA:  Fall EXAM: CT CERVICAL SPINE WITHOUT CONTRAST TECHNIQUE: Multidetector CT imaging of the cervical spine was performed without intravenous contrast. Multiplanar CT image reconstructions were also generated. RADIATION DOSE REDUCTION: This exam was performed according to the departmental dose-optimization program which includes automated exposure control, adjustment of the mA and/or kV according to patient size and/or use of iterative reconstruction technique. COMPARISON:  10/15/2022 FINDINGS: Alignment: No subluxation Skull base and vertebrae: No acute fracture. No primary bone lesion or focal pathologic process. Soft tissues and spinal canal: No prevertebral fluid or swelling. No visible canal hematoma. Disc levels: Prior fusion throughout the cervical spine from C3 to T1. Anterior plate remain in place at C3-4 and C7-T1. Posterior fusion at C7-T1. Upper chest: No acute findings Other: None IMPRESSION: Postoperative and degenerative changes.  No acute bony abnormality. Electronically Signed   By: Rolm Baptise M.D.   On: 11/11/2022 18:15   DG Humerus Right  Result Date: 11/11/2022 CLINICAL DATA:  Trauma EXAM: RIGHT HUMERUS - 2+ VIEW COMPARISON:  10/15/2022 FINDINGS: Right humeral neck fracture again noted. There is difference in alignment of the humeral head and humeral shaft when compared to prior study suggesting re-injury or new fracture superimposed on subacute to chronic fracture. No subluxation or dislocation. IMPRESSION: Right humeral neck fracture, likely new fracture superimposed on subacute to chronic fracture. Electronically Signed   By: Rolm Baptise M.D.   On: 11/11/2022 18:04    EKG: I independently viewed the EKG done and my findings are as followed: Normal sinus rhythm at a rate of 80  bpm  Assessment/Plan Present on Admission:  Uncontrolled type 2 diabetes mellitus with hyperglycemia (HCC)  Hyperkalemia  GERD (gastroesophageal reflux disease)  Essential hypertension  Mixed hyperlipidemia  Femur fracture, right (HCC)  Principal Problem:   Uncontrolled type 2 diabetes mellitus with hyperglycemia (HCC) Active Problems:   Essential hypertension   GERD (gastroesophageal reflux disease)   Mixed hyperlipidemia   Femur fracture, right (HCC)   Hyperkalemia   Recurrent falls   Pseudohyponatremia   Thrombocytosis   Hyperglycemia secondary to poorly controlled type 2 diabetes mellitus/HHS Beta-hydroxybutyrate acid 0.85 Continue insulin drip, IV LR  per DKA protocol Transition IV LR to D5 LR when serum glucose reaches '250mg'$ /dL Continue serial BMP and VBG Continue to monitor for improved glucose level prior to transitioning patient to subcu insulin Continue NPO  Pseudohyponatremia Na 116, corrected sodium level based on hyperglycemia (893) is 129  Hyperkalemia K+ 5.7, no EKG changes Continue insulin drip and monitor for potassium level  Acute on chronic right femoral fracture Recurrent falls Continue Tylenol as needed Continue fall precaution Continue PT/OT eval and treat  Thrombocytosis possibly reactive Platelets 443, continue to monitor platelet levels  Chronic pancreatitis Continue Creon  CKD stage V Renally adjust medications, avoid nephrotoxic agents/dehydration/hypotension  Essential hypertension Continue hydralazine and metoprolol   Mixed hyperlipidemia Continue Lipitor   GERD Continue Protonix  DVT prophylaxis: Heparin subcu  Code Status: Full code  Family Communication: None at bedside  Consults: None  Severity of Illness: The appropriate patient status for this patient is OBSERVATION. Observation status is judged to be reasonable and necessary in order to provide the required intensity of service to ensure the patient's  safety.  The patient's presenting symptoms, physical exam findings, and initial radiographic and laboratory data in the context of their medical condition is felt to place them at decreased risk for further clinical deterioration. Furthermore, it is anticipated that the patient will be medically stable for discharge from the hospital within 2 midnights of admission.   Author: Bernadette Hoit, DO 11/12/2022 1:52 AM  For on call review [email or web address].

## 2022-11-11 NOTE — Progress Notes (Signed)
Attempted ABG but was unsuccessful.  Will alert RN.

## 2022-11-11 NOTE — ED Triage Notes (Signed)
Pt presents to ED following fall yesterday. Pt with bruise to left side of her head. Pt A&O x 4. Denies LOC, Denies blood thinners

## 2022-11-11 NOTE — ED Notes (Signed)
Provider notified of inability to get ABG, verbal order to try and get VBG instead.

## 2022-11-12 DIAGNOSIS — E875 Hyperkalemia: Secondary | ICD-10-CM | POA: Diagnosis not present

## 2022-11-12 DIAGNOSIS — N184 Chronic kidney disease, stage 4 (severe): Secondary | ICD-10-CM | POA: Diagnosis not present

## 2022-11-12 DIAGNOSIS — F419 Anxiety disorder, unspecified: Secondary | ICD-10-CM | POA: Diagnosis present

## 2022-11-12 DIAGNOSIS — N185 Chronic kidney disease, stage 5: Secondary | ICD-10-CM | POA: Diagnosis present

## 2022-11-12 DIAGNOSIS — R7989 Other specified abnormal findings of blood chemistry: Secondary | ICD-10-CM | POA: Insufficient documentation

## 2022-11-12 DIAGNOSIS — W19XXXA Unspecified fall, initial encounter: Secondary | ICD-10-CM | POA: Diagnosis present

## 2022-11-12 DIAGNOSIS — I132 Hypertensive heart and chronic kidney disease with heart failure and with stage 5 chronic kidney disease, or end stage renal disease: Secondary | ICD-10-CM | POA: Diagnosis present

## 2022-11-12 DIAGNOSIS — I10 Essential (primary) hypertension: Secondary | ICD-10-CM | POA: Diagnosis not present

## 2022-11-12 DIAGNOSIS — E1165 Type 2 diabetes mellitus with hyperglycemia: Secondary | ICD-10-CM | POA: Diagnosis present

## 2022-11-12 DIAGNOSIS — D75839 Thrombocytosis, unspecified: Secondary | ICD-10-CM | POA: Insufficient documentation

## 2022-11-12 DIAGNOSIS — E1110 Type 2 diabetes mellitus with ketoacidosis without coma: Secondary | ICD-10-CM | POA: Diagnosis not present

## 2022-11-12 DIAGNOSIS — Z888 Allergy status to other drugs, medicaments and biological substances status: Secondary | ICD-10-CM | POA: Diagnosis not present

## 2022-11-12 DIAGNOSIS — R627 Adult failure to thrive: Secondary | ICD-10-CM | POA: Diagnosis present

## 2022-11-12 DIAGNOSIS — K861 Other chronic pancreatitis: Secondary | ICD-10-CM | POA: Diagnosis present

## 2022-11-12 DIAGNOSIS — K219 Gastro-esophageal reflux disease without esophagitis: Secondary | ICD-10-CM | POA: Diagnosis present

## 2022-11-12 DIAGNOSIS — E1122 Type 2 diabetes mellitus with diabetic chronic kidney disease: Secondary | ICD-10-CM | POA: Diagnosis present

## 2022-11-12 DIAGNOSIS — F32A Depression, unspecified: Secondary | ICD-10-CM | POA: Diagnosis present

## 2022-11-12 DIAGNOSIS — M84451A Pathological fracture, right femur, initial encounter for fracture: Secondary | ICD-10-CM | POA: Diagnosis present

## 2022-11-12 DIAGNOSIS — R296 Repeated falls: Secondary | ICD-10-CM | POA: Diagnosis present

## 2022-11-12 DIAGNOSIS — E1140 Type 2 diabetes mellitus with diabetic neuropathy, unspecified: Secondary | ICD-10-CM | POA: Diagnosis present

## 2022-11-12 DIAGNOSIS — E782 Mixed hyperlipidemia: Secondary | ICD-10-CM | POA: Diagnosis present

## 2022-11-12 DIAGNOSIS — I5032 Chronic diastolic (congestive) heart failure: Secondary | ICD-10-CM | POA: Diagnosis not present

## 2022-11-12 DIAGNOSIS — D649 Anemia, unspecified: Secondary | ICD-10-CM | POA: Diagnosis not present

## 2022-11-12 DIAGNOSIS — L89152 Pressure ulcer of sacral region, stage 2: Secondary | ICD-10-CM | POA: Diagnosis present

## 2022-11-12 DIAGNOSIS — E871 Hypo-osmolality and hyponatremia: Secondary | ICD-10-CM | POA: Diagnosis not present

## 2022-11-12 DIAGNOSIS — I130 Hypertensive heart and chronic kidney disease with heart failure and stage 1 through stage 4 chronic kidney disease, or unspecified chronic kidney disease: Secondary | ICD-10-CM | POA: Diagnosis not present

## 2022-11-12 DIAGNOSIS — Z794 Long term (current) use of insulin: Secondary | ICD-10-CM | POA: Diagnosis not present

## 2022-11-12 DIAGNOSIS — D631 Anemia in chronic kidney disease: Secondary | ICD-10-CM | POA: Diagnosis present

## 2022-11-12 DIAGNOSIS — S42214A Unspecified nondisplaced fracture of surgical neck of right humerus, initial encounter for closed fracture: Secondary | ICD-10-CM | POA: Diagnosis present

## 2022-11-12 DIAGNOSIS — N179 Acute kidney failure, unspecified: Secondary | ICD-10-CM | POA: Diagnosis not present

## 2022-11-12 LAB — URINALYSIS, ROUTINE W REFLEX MICROSCOPIC
Bacteria, UA: NONE SEEN
Bilirubin Urine: NEGATIVE
Glucose, UA: >=500 mg/dL — AB
Hgb urine dipstick: NEGATIVE
Ketones, ur: NEGATIVE mg/dL
Leukocytes,Ua: NEGATIVE
Nitrite: NEGATIVE
Protein, ur: 100 mg/dL — AB
Specific Gravity, Urine: 1.011 (ref 1.005–1.030)
pH: 5 (ref 5.0–8.0)

## 2022-11-12 LAB — COMPREHENSIVE METABOLIC PANEL
ALT: 14 U/L (ref 0–44)
AST: 22 U/L (ref 15–41)
Albumin: 3.6 g/dL (ref 3.5–5.0)
Alkaline Phosphatase: 171 U/L — ABNORMAL HIGH (ref 38–126)
Anion gap: 17 — ABNORMAL HIGH (ref 5–15)
BUN: 126 mg/dL — ABNORMAL HIGH (ref 8–23)
CO2: 14 mmol/L — ABNORMAL LOW (ref 22–32)
Calcium: 10 mg/dL (ref 8.9–10.3)
Chloride: 95 mmol/L — ABNORMAL LOW (ref 98–111)
Creatinine, Ser: 2.99 mg/dL — ABNORMAL HIGH (ref 0.44–1.00)
GFR, Estimated: 16 mL/min — ABNORMAL LOW (ref >60)
Glucose, Bld: 178 mg/dL — ABNORMAL HIGH (ref 70–99)
Potassium: 4.3 mmol/L (ref 3.5–5.1)
Sodium: 126 mmol/L — ABNORMAL LOW (ref 135–145)
Total Bilirubin: 0.5 mg/dL (ref 0.3–1.2)
Total Protein: 7.7 g/dL (ref 6.5–8.1)

## 2022-11-12 LAB — BASIC METABOLIC PANEL
Anion gap: 13 (ref 5–15)
BUN: 123 mg/dL — ABNORMAL HIGH (ref 8–23)
CO2: 17 mmol/L — ABNORMAL LOW (ref 22–32)
Calcium: 9.6 mg/dL (ref 8.9–10.3)
Chloride: 96 mmol/L — ABNORMAL LOW (ref 98–111)
Creatinine, Ser: 2.81 mg/dL — ABNORMAL HIGH (ref 0.44–1.00)
GFR, Estimated: 18 mL/min — ABNORMAL LOW (ref >60)
Glucose, Bld: 217 mg/dL — ABNORMAL HIGH (ref 70–99)
Potassium: 4.5 mmol/L (ref 3.5–5.1)
Sodium: 126 mmol/L — ABNORMAL LOW (ref 135–145)

## 2022-11-12 LAB — CBG MONITORING, ED
Glucose-Capillary: 142 mg/dL — ABNORMAL HIGH (ref 70–99)
Glucose-Capillary: 174 mg/dL — ABNORMAL HIGH (ref 70–99)
Glucose-Capillary: 179 mg/dL — ABNORMAL HIGH (ref 70–99)
Glucose-Capillary: 196 mg/dL — ABNORMAL HIGH (ref 70–99)
Glucose-Capillary: 204 mg/dL — ABNORMAL HIGH (ref 70–99)
Glucose-Capillary: 221 mg/dL — ABNORMAL HIGH (ref 70–99)
Glucose-Capillary: 226 mg/dL — ABNORMAL HIGH (ref 70–99)
Glucose-Capillary: 228 mg/dL — ABNORMAL HIGH (ref 70–99)
Glucose-Capillary: 238 mg/dL — ABNORMAL HIGH (ref 70–99)
Glucose-Capillary: 238 mg/dL — ABNORMAL HIGH (ref 70–99)
Glucose-Capillary: 336 mg/dL — ABNORMAL HIGH (ref 70–99)
Glucose-Capillary: 436 mg/dL — ABNORMAL HIGH (ref 70–99)
Glucose-Capillary: 456 mg/dL — ABNORMAL HIGH (ref 70–99)
Glucose-Capillary: 567 mg/dL (ref 70–99)
Glucose-Capillary: 585 mg/dL (ref 70–99)

## 2022-11-12 LAB — CBC
HCT: 32.5 % — ABNORMAL LOW (ref 36.0–46.0)
Hemoglobin: 10.9 g/dL — ABNORMAL LOW (ref 12.0–15.0)
MCH: 29.5 pg (ref 26.0–34.0)
MCHC: 33.5 g/dL (ref 30.0–36.0)
MCV: 88.1 fL (ref 80.0–100.0)
Platelets: 339 10*3/uL (ref 150–400)
RBC: 3.69 MIL/uL — ABNORMAL LOW (ref 3.87–5.11)
RDW: 14.3 % (ref 11.5–15.5)
WBC: 14.5 10*3/uL — ABNORMAL HIGH (ref 4.0–10.5)
nRBC: 0 % (ref 0.0–0.2)

## 2022-11-12 LAB — GLUCOSE, CAPILLARY
Glucose-Capillary: 233 mg/dL — ABNORMAL HIGH (ref 70–99)
Glucose-Capillary: 223 mg/dL — ABNORMAL HIGH (ref 70–99)
Glucose-Capillary: 196 mg/dL — ABNORMAL HIGH (ref 70–99)
Glucose-Capillary: 186 mg/dL — ABNORMAL HIGH (ref 70–99)
Glucose-Capillary: 193 mg/dL — ABNORMAL HIGH (ref 70–99)

## 2022-11-12 LAB — MAGNESIUM: Magnesium: 2.9 mg/dL — ABNORMAL HIGH (ref 1.7–2.4)

## 2022-11-12 LAB — BETA-HYDROXYBUTYRIC ACID: Beta-Hydroxybutyric Acid: 0.34 mmol/L — ABNORMAL HIGH (ref 0.05–0.27)

## 2022-11-12 LAB — HIV ANTIBODY (ROUTINE TESTING W REFLEX): HIV Screen 4th Generation wRfx: NONREACTIVE

## 2022-11-12 LAB — MRSA NEXT GEN BY PCR, NASAL: MRSA by PCR Next Gen: DETECTED — AB

## 2022-11-12 LAB — PHOSPHORUS: Phosphorus: 4 mg/dL (ref 2.5–4.6)

## 2022-11-12 MED ORDER — HEPARIN SODIUM (PORCINE) 5000 UNIT/ML IJ SOLN
5000.0000 [IU] | Freq: Three times a day (TID) | INTRAMUSCULAR | Status: DC
  Administered 2022-11-12 – 2022-11-14 (×8): 5000 [IU] via SUBCUTANEOUS
  Filled 2022-11-12 (×8): qty 1

## 2022-11-12 MED ORDER — PANTOPRAZOLE SODIUM 40 MG PO TBEC: 80.0000 mg | DELAYED_RELEASE_TABLET | Freq: Every day | ORAL | Status: DC

## 2022-11-12 MED ORDER — CHLORHEXIDINE GLUCONATE CLOTH 2 % EX PADS
6.0000 | MEDICATED_PAD | Freq: Every day | CUTANEOUS | Status: DC
  Administered 2022-11-12 – 2022-11-14 (×3): 6 via TOPICAL

## 2022-11-12 MED ORDER — PANTOPRAZOLE SODIUM 40 MG PO TBEC
40.0000 mg | DELAYED_RELEASE_TABLET | Freq: Every day | ORAL | Status: DC
  Administered 2022-11-12 – 2022-11-14 (×3): 40 mg via ORAL
  Filled 2022-11-12 (×3): qty 1

## 2022-11-12 MED ORDER — METOPROLOL SUCCINATE ER 25 MG PO TB24
25.0000 mg | ORAL_TABLET | Freq: Every day | ORAL | Status: DC
  Administered 2022-11-12 – 2022-11-13 (×2): 25 mg via ORAL
  Filled 2022-11-12 (×2): qty 1

## 2022-11-12 MED ORDER — ACETAMINOPHEN 325 MG PO TABS
650.0000 mg | ORAL_TABLET | Freq: Four times a day (QID) | ORAL | Status: DC | PRN
  Administered 2022-11-12 – 2022-11-13 (×2): 650 mg via ORAL
  Filled 2022-11-12 (×2): qty 2

## 2022-11-12 MED ORDER — MUPIROCIN 2 % EX OINT
1.0000 | TOPICAL_OINTMENT | Freq: Two times a day (BID) | CUTANEOUS | Status: DC
  Administered 2022-11-12 – 2022-11-14 (×4): 1 via NASAL
  Filled 2022-11-12: qty 22

## 2022-11-12 MED ORDER — ATORVASTATIN CALCIUM 40 MG PO TABS
80.0000 mg | ORAL_TABLET | Freq: Every day | ORAL | Status: DC
  Administered 2022-11-12 – 2022-11-14 (×3): 80 mg via ORAL
  Filled 2022-11-12 (×3): qty 2

## 2022-11-12 MED ORDER — ONDANSETRON HCL 4 MG/2ML IJ SOLN
4.0000 mg | Freq: Four times a day (QID) | INTRAMUSCULAR | Status: DC | PRN
  Administered 2022-11-12 – 2022-11-14 (×3): 4 mg via INTRAVENOUS
  Filled 2022-11-12 (×3): qty 2

## 2022-11-12 MED ORDER — HYDRALAZINE HCL 25 MG PO TABS
50.0000 mg | ORAL_TABLET | Freq: Three times a day (TID) | ORAL | Status: DC
  Administered 2022-11-12 – 2022-11-14 (×8): 50 mg via ORAL
  Filled 2022-11-12 (×8): qty 2

## 2022-11-12 MED ORDER — PANCRELIPASE (LIP-PROT-AMYL) 36000-114000 UNITS PO CPEP
36000.0000 [IU] | ORAL_CAPSULE | Freq: Three times a day (TID) | ORAL | Status: DC
  Administered 2022-11-13 – 2022-11-14 (×4): 36000 [IU] via ORAL
  Filled 2022-11-12 (×5): qty 1

## 2022-11-12 MED ORDER — ACETAMINOPHEN 650 MG RE SUPP: 650.0000 mg | Freq: Four times a day (QID) | RECTAL | Status: DC | PRN

## 2022-11-12 MED ORDER — ONDANSETRON HCL 4 MG PO TABS: 4.0000 mg | ORAL_TABLET | Freq: Four times a day (QID) | ORAL | Status: DC | PRN

## 2022-11-12 NOTE — Progress Notes (Signed)
PROGRESS NOTE     Heather [name], is a 72 y.o. female, DOB - [birth year], MLY:[id number]  Admit date - 11/11/2022   Admitting Physician Heather Shroff Denton Brick, MD  Outpatient Primary MD for the patient is Heather Blitz, MD  LOS - 0  Chief Complaint  Patient presents with   Fall        Brief Narrative:   72 y.o. female with medical history significant of hypertension, uncontrolled type 2 diabetes mellitus with hyperglycemia, CKD stage IV, admitted on 11/11/2022 with severe hyperglycemia with HHS/DKA and AKI on CKD    -Assessment and Plan: 1)HHS/DKA -Continue IV insulin per Endo tool protocol -Serial BMP,   2)Pseudohyponatremia Na 116, corrected sodium level based on hyperglycemia (893) is 129 -Continue to hydrate  3)Hyperkalemia in the setting of hyperglycemia/DKA--should improve with IV insulin K+ 5.7, no EKG changes  4)Right humeral neck fracture, likely new fracture superimposed on subacute to chronic fracture- -continue sling use- physical therapy eval appreciated-outpatient follow-up with orthopedic surgeon advisedAcute on chronic right femoral fracture Recurrent falls Continue Tylenol as needed Continue fall precaution Continue PT/OT eval and treat  5)CKD stage V Renally adjust medications, avoid nephrotoxic agents/dehydration/hypotension -Nephrology consult appreciated   6)Chronic pancreatitis Continue Creon   7)HTN-Continue hydralazine and metoprolol   8)GERD Continue Protonix  9)Recurrent falls---PTA pt lived alone and did very poorly, patient has significant limitations with mobility related ADLs- this patient needs to continue to be monitored in the hospital until a SNF bed is obtained as she is not safe to go home with her current physcical limitations -Right upper extremity fracture.... Difficulties with ADLs -Physical therapy eval appreciated recommends SNF rehab  Status is: Inpatient   Disposition: The patient is from: Home              Anticipated  d/c is to: SNF              Anticipated d/c date is: 1 day              Patient currently is not medically stable to d/c. Barriers: Not Clinically Stable-   Code Status :  -  Code Status: Full Code   Family Communication:    NA (patient is alert, awake and coherent)   DVT Prophylaxis  :   - SCDs  heparin injection 5,000 Units Start: 11/12/22 0145 SCDs Start: 11/12/22 0137   Lab Results  Component Value Date   PLT 339 11/12/2022    Inpatient Medications  Scheduled Meds:  atorvastatin  80 mg Oral Daily   Chlorhexidine Gluconate Cloth  6 each Topical Daily   heparin  5,000 Units Subcutaneous Q8H   hydrALAZINE  50 mg Oral Q8H   [START ON 11/13/2022] lipase/protease/amylase  36,000 Units Oral TID WC   metoprolol succinate  25 mg Oral Daily   pantoprazole  40 mg Oral Daily   Continuous Infusions:  sodium chloride Stopped (11/12/22 0657)   dextrose 5% lactated ringers 125 mL/hr at 11/12/22 1858   insulin 5.5 Units/hr (11/12/22 1858)   lactated ringers Stopped (11/12/22 1212)   PRN Meds:.acetaminophen **OR** acetaminophen, dextrose, ondansetron **OR** ondansetron (ZOFRAN) IV   Anti-infectives (From admission, onward)    None         Subjective: Heather [name] today has no fevers, no emesis,  No chest pain,   - Voiding okay, C/o right shoulder area discomfort  Objective: Vitals:   11/12/22 1756 11/12/22 1758 11/12/22 1804 11/12/22 1900  BP:   (!) 171/62 116/83  Pulse:  98 (!) 101  Resp:   17 (!) 28  Temp:  97.7 F (36.5 C)    TempSrc:  Oral    SpO2:   99% 98%  Weight: 51.3 kg     Height: '5\' 4"'$  (1.626 m)       Intake/Output Summary (Last 24 hours) at 11/12/2022 1915 Last data filed at 11/12/2022 1858 Gross per 24 hour  Intake 3419.99 ml  Output --  Net 3419.99 ml   Filed Weights   11/11/22 1633 11/12/22 1756  Weight: 59 kg 51.3 kg   Physical Exam  Gen:- Awake Alert,  in no apparent distress  HEENT:- Heather [name].AT, No sclera icterus Neck-Supple Neck,No  JVD,.  Lungs-  CTAB , fair symmetrical air movement CV- S1, S2 normal, regular  Abd-  +ve B.Sounds, Abd Soft, No tenderness,    Extremity/Skin:- No  edema, pedal pulses present  Psych-affect is appropriate, oriented x3 Neuro-no new focal deficits, no tremors MSK-right upper extremity in sling  Data Reviewed: I have personally reviewed following labs and imaging studies  CBC: Recent Labs  Lab 11/11/22 1818 11/12/22 0838  WBC 10.1 14.5*  NEUTROABS 8.1*  --   HGB 10.4* 10.9*  HCT 31.4* 32.5*  MCV 88.7 88.1  PLT 443* 332   Basic Metabolic Panel: Recent Labs  Lab 11/11/22 1818 11/11/22 2012 11/12/22 0838  NA 116* 115* 126*  K 5.7* 5.9* 4.3  CL 82* 85* 95*  CO2 19* 14* 14*  GLUCOSE 893* 895* 178*  BUN 133* 132* 126*  CREATININE 3.36* 3.47* 2.99*  CALCIUM 10.0 9.6 10.0  MG  --   --  2.9*  PHOS  --   --  4.0   GFR: Estimated Creatinine Clearance: 14.2 mL/min (A) (by C-G formula based on SCr of 2.99 mg/dL (H)). Liver Function Tests: Recent Labs  Lab 11/11/22 1818 11/12/22 0838  AST 18 22  ALT 14 14  ALKPHOS 172* 171*  BILITOT 0.7 0.5  PROT 7.8 7.7  ALBUMIN 3.5 3.6   Radiology Studies: CT Head Wo Contrast  Result Date: 11/11/2022 CLINICAL DATA:  Head trauma. Blunt. Fall yesterday with bruising to left side of head. EXAM: CT HEAD WITHOUT CONTRAST TECHNIQUE: Contiguous axial images were obtained from the base of the skull through the vertex without intravenous contrast. RADIATION DOSE REDUCTION: This exam was performed according to the departmental dose-optimization program which includes automated exposure control, adjustment of the mA and/or kV according to patient size and/or use of iterative reconstruction technique. COMPARISON:  CT head without contrast 10/15/2022 FINDINGS: Brain: Asymmetric hyperdensity within the left lentiform nucleus is similar the prior exam. No significant right-sided density is present. No acute hemorrhage contusion is evident. No acute  infarct is present. Moderate generalized atrophy and white matter disease is similar the prior study. The ventricles are proportionate to the degree of atrophy. No significant extraaxial fluid collection is present. The brainstem and cerebellum are within normal limits. Midline structures are within normal limits. Vascular: Dense atherosclerotic calcifications are present within the cavernous internal carotid arteries and at the dural margin of both vertebral arteries. No hyperdense vessel is present. Skull: Calvarium is intact. No focal lytic or blastic lesions are present. No significant extracranial soft tissue lesion is present. Sinuses/Orbits: The paranasal sinuses and mastoid air cells are clear. Bilateral lens replacements are noted. Globes and orbits are otherwise unremarkable. IMPRESSION: 1. No acute intracranial abnormality or significant interval change. 2. Stable moderate generalized atrophy and white matter disease. This likely reflects the sequela of  chronic microvascular ischemia. 3. Persistent asymmetric density within the left lentiform nucleus. This has been described in the setting of nonketotic hyperglycemic hemichorea. This would be consistent with the patient's history of hyperglycemia. The finding typically resolves over time. Electronically Signed   By: San Morelle M.D.   On: 11/11/2022 18:23   CT Cervical Spine Wo Contrast  Result Date: 11/11/2022 CLINICAL DATA:  Fall EXAM: CT CERVICAL SPINE WITHOUT CONTRAST TECHNIQUE: Multidetector CT imaging of the cervical spine was performed without intravenous contrast. Multiplanar CT image reconstructions were also generated. RADIATION DOSE REDUCTION: This exam was performed according to the departmental dose-optimization program which includes automated exposure control, adjustment of the mA and/or kV according to patient size and/or use of iterative reconstruction technique. COMPARISON:  10/15/2022 FINDINGS: Alignment: No subluxation  Skull base and vertebrae: No acute fracture. No primary bone lesion or focal pathologic process. Soft tissues and spinal canal: No prevertebral fluid or swelling. No visible canal hematoma. Disc levels: Prior fusion throughout the cervical spine from C3 to T1. Anterior plate remain in place at C3-4 and C7-T1. Posterior fusion at C7-T1. Upper chest: No acute findings Other: None IMPRESSION: Postoperative and degenerative changes.  No acute bony abnormality. Electronically Signed   By: Rolm Baptise M.D.   On: 11/11/2022 18:15   DG Humerus Right  Result Date: 11/11/2022 CLINICAL DATA:  Trauma EXAM: RIGHT HUMERUS - 2+ VIEW COMPARISON:  10/15/2022 FINDINGS: Right humeral neck fracture again noted. There is difference in alignment of the humeral head and humeral shaft when compared to prior study suggesting re-injury or new fracture superimposed on subacute to chronic fracture. No subluxation or dislocation. IMPRESSION: Right humeral neck fracture, likely new fracture superimposed on subacute to chronic fracture. Electronically Signed   By: Rolm Baptise M.D.   On: 11/11/2022 18:04     Scheduled Meds:  atorvastatin  80 mg Oral Daily   Chlorhexidine Gluconate Cloth  6 each Topical Daily   heparin  5,000 Units Subcutaneous Q8H   hydrALAZINE  50 mg Oral Q8H   [START ON 11/13/2022] lipase/protease/amylase  36,000 Units Oral TID WC   metoprolol succinate  25 mg Oral Daily   pantoprazole  40 mg Oral Daily   Continuous Infusions:  sodium chloride Stopped (11/12/22 0657)   dextrose 5% lactated ringers 125 mL/hr at 11/12/22 1858   insulin 5.5 Units/hr (11/12/22 1858)   lactated ringers Stopped (11/12/22 1212)     LOS: 0 days    Roxan Hockey M.D on 11/12/2022 at 7:15 PM  Go to [email or web address] - for contact info  [facility] - Office  [phone]  If 7PM-7AM, please contact night-coverage [email or web address] 11/12/2022, 7:15 PM

## 2022-11-12 NOTE — Inpatient Diabetes Management (Signed)
Inpatient Diabetes Program Recommendations  AACE/ADA: New Consensus Statement on Inpatient Glycemic Control (2015)  Target Ranges:  Prepandial:   less than 140 mg/dL      Peak postprandial:   less than 180 mg/dL (1-2 hours)      Critically ill patients:  140 - 180 mg/dL   Lab Results  Component Value Date   GLUCAP 142 (H) 11/12/2022   HGBA1C >15.5 (H) 10/17/2022    Review of Glycemic Control  Diabetes history: DM type 2 Outpatient Diabetes medications: Farxiga 10 mg Daily, Glipizide 10 mg Daily, Novolog 5-11 units tid, Levemir 20 units qhs Current orders for Inpatient glycemic control:  IV insulin  A1c >15.5% last admission in December Glucose 895 on presentation  Pt known to our team and seen recently in December regarding her A1c level and was suggested she utilize an Musician for glucose control.  Inpatient Diabetes Program Recommendations:    Elevated renal function, Consider:  -    Levemir 10 units -    Novolog 0-9 units tid + hs  Thanks,  Tama Headings RN, MSN, BC-ADM Inpatient Diabetes Coordinator Team Pager [phone] (8a-5p)

## 2022-11-12 NOTE — Plan of Care (Signed)
  Problem: Acute Rehab PT Goals(only PT should resolve) Goal: Pt Will Go Supine/Side To Sit Outcome: Progressing Flowsheets (Taken 11/12/2022 1139) Pt will go Supine/Side to Sit:  with supervision  with modified independence Goal: Patient Will Transfer Sit To/From Stand Outcome: Progressing Flowsheets (Taken 11/12/2022 1139) Patient will transfer sit to/from stand: with minimal assist Goal: Pt Will Transfer Bed To Chair/Chair To Bed Outcome: Progressing Flowsheets (Taken 11/12/2022 1139) Pt will Transfer Bed to Chair/Chair to Bed: with min assist Goal: Pt Will Ambulate Outcome: Progressing Flowsheets (Taken 11/12/2022 1139) Pt will Ambulate:  25 feet  with minimal assist  with moderate assist  with rolling walker   11:39 AM, 11/12/22 Lonell Grandchild, MPT Physical Therapist with [facility] [phone] office [phone] mobile phone

## 2022-11-12 NOTE — Consult Note (Signed)
[facility] Renal Consultation Note  Requesting MD: Emokpae Indication for Consultation: A on crf - hyponatremia and hyperkalemia  HPI:  Heather [name] is a 72 y.o. female with past medical history significant for hypertension, poorly controlled type 2 diabetes mellitus, chronic diastolic heart failure, anxiety/depression and also stage IV CKD-possibly followed by Dr. Theador Hawthorne as an outpatient.  A few episodes of acute on chronic renal failure most recently in December where creatinine peaked at 3.2 but was down to 2.6 on the day of discharge 10/21/2022-at which time she was discharged to SNF.  She comes back to the emergency department yesterday with again falls-has a new right-sided hip fracture and creatinine again 3.3 with BUN over 130, sodium less than 120, and potassium over 5 all associated with sugar over 500.  With insulin and fluids numbers have improved.  Patient appears to be in no acute distress despite her metabolic derangement.  She is alert, complains of being cold.  She says she has been eating well and not having any nausea  Creatinine, Ser  Date/Time Value Ref Range Status  11/12/2022 08:38 AM 2.99 (H) 0.44 - 1.00 mg/dL Final  11/11/2022 08:12 PM 3.47 (H) 0.44 - 1.00 mg/dL Final  11/11/2022 06:18 PM 3.36 (H) 0.44 - 1.00 mg/dL Final  10/21/2022 04:57 AM 2.62 (H) 0.44 - 1.00 mg/dL Final  10/20/2022 04:47 AM 2.86 (H) 0.44 - 1.00 mg/dL Final  10/19/2022 03:58 AM 3.22 (H) 0.44 - 1.00 mg/dL Final  10/18/2022 04:23 AM 3.15 (H) 0.44 - 1.00 mg/dL Final  10/17/2022 04:19 AM 3.06 (H) 0.44 - 1.00 mg/dL Final  10/16/2022 05:02 AM 3.17 (H) 0.44 - 1.00 mg/dL Final  10/15/2022 07:44 PM 3.37 (H) 0.44 - 1.00 mg/dL Final  10/15/2022 04:09 PM 3.27 (H) 0.44 - 1.00 mg/dL Final  07/31/2022 01:03 PM 2.36 (H) 0.57 - 1.00 mg/dL Final  07/07/2022 12:23 PM 2.13 (H) 0.44 - 1.00 mg/dL Final  07/06/2022 03:34 AM 2.72 (H) 0.44 - 1.00 mg/dL Final  07/05/2022 03:42 AM 2.59 (H) 0.44 -  1.00 mg/dL Final  07/04/2022 09:47 PM 2.70 (H) 0.44 - 1.00 mg/dL Final  07/04/2022 03:16 PM 2.68 (H) 0.44 - 1.00 mg/dL Final  06/22/2022 04:16 AM 2.13 (H) 0.44 - 1.00 mg/dL Final  06/21/2022 06:07 AM 2.88 (H) 0.44 - 1.00 mg/dL Final  06/20/2022 10:25 AM 2.99 (H) 0.44 - 1.00 mg/dL Final  06/20/2022 03:01 AM 3.57 (H) 0.44 - 1.00 mg/dL Final  06/19/2022 06:59 PM 3.77 (H) 0.44 - 1.00 mg/dL Final  06/19/2022 02:55 PM 3.59 (H) 0.44 - 1.00 mg/dL Final  06/06/2022 01:32 PM 2.35 (H) 0.44 - 1.00 mg/dL Final  05/27/2022 09:15 AM 2.70 (H) 0.44 - 1.00 mg/dL Final  05/27/2022 04:35 AM 2.68 (H) 0.44 - 1.00 mg/dL Final  05/27/2022 02:00 AM 2.86 (H) 0.44 - 1.00 mg/dL Final  05/26/2022 10:16 PM 2.92 (H) 0.44 - 1.00 mg/dL Final  05/26/2022 06:13 PM 3.25 (H) 0.44 - 1.00 mg/dL Final  05/26/2022 01:32 PM 3.47 (H) 0.44 - 1.00 mg/dL Final  05/26/2022 11:00 AM 3.45 (H) 0.44 - 1.00 mg/dL Final  05/17/2022 01:32 PM 2.53 (H) 0.44 - 1.00 mg/dL Final  03/17/2022 04:55 AM 2.93 (H) 0.44 - 1.00 mg/dL Final  03/16/2022 03:34 AM 3.63 (H) 0.44 - 1.00 mg/dL Final  12/01/2021 06:24 AM 2.78 (H) 0.44 - 1.00 mg/dL Final  11/30/2021 05:46 AM 2.73 (H) 0.44 - 1.00 mg/dL Final    Comment:    DELTA CHECK NOTED  11/28/2021 02:37 PM 3.87 (  H) 0.44 - 1.00 mg/dL Final  11/25/2021 03:00 PM 2.82 (H) 0.44 - 1.00 mg/dL Final  09/06/2021 01:53 AM 3.60 (H) 0.44 - 1.00 mg/dL Final  09/05/2021 02:57 AM 3.79 (H) 0.44 - 1.00 mg/dL Final  09/04/2021 03:00 AM 3.85 (H) 0.44 - 1.00 mg/dL Final  09/03/2021 03:04 AM 4.12 (H) 0.44 - 1.00 mg/dL Final  09/02/2021 02:54 AM 4.51 (H) 0.44 - 1.00 mg/dL Final  09/01/2021 03:25 AM 4.61 (H) 0.44 - 1.00 mg/dL Final  08/31/2021 05:00 AM 4.93 (H) 0.44 - 1.00 mg/dL Final  08/30/2021 05:00 AM 4.60 (H) 0.44 - 1.00 mg/dL Final  08/29/2021 03:50 AM 4.32 (H) 0.44 - 1.00 mg/dL Final  08/28/2021 06:33 AM 3.88 (H) 0.44 - 1.00 mg/dL Final  08/27/2021 04:00 AM 3.37 (H) 0.44 - 1.00 mg/dL Final  08/26/2021 01:03 AM 3.54  (H) 0.44 - 1.00 mg/dL Final  08/25/2021 04:07 AM 3.65 (H) 0.44 - 1.00 mg/dL Final  08/24/2021 04:30 AM 3.12 (H) 0.44 - 1.00 mg/dL Final     PMHx:   Past Medical History:  Diagnosis Date   Acute renal failure superimposed on stage 3 chronic kidney disease ([place]) 01/21/2016   Anemia    Anxiety    B12 deficiency    Brain tumor (benign) ([place]) 10/29/2020   Cellulitis and abscess    Abdomen and buttocks   Chronic diastolic CHF (congestive heart failure) ([place]) 03/16/2022   CKD (chronic kidney disease) stage 3, GFR 30-59 ml/min (HCC)    Closed fracture of distal end of femur, unspecified fracture morphology, initial encounter ([place])    Coronary arteriosclerosis 07/15/2018   Crohn disease ([place])    Depression    Diabetic neuropathy (HCC)    feet   DJD (degenerative joint disease)    Right forminal stenosis C4-5   Essential hypertension    Femur fracture, right ([place]) 93/79/0240   Folliculitis    GERD (gastroesophageal reflux disease)    Gout    Headache(784.0)    Hiatal hernia    History of blood transfusion    History of bronchitis    History of cardiac catheterization    No significant CAD 2012   History of kidney stones    surgery to remove   Insomnia    Irritable bowel syndrome    Mixed hyperlipidemia    Nausea & vomiting 12/22/2014   Osteoarthritis    Palpitations    Pneumonia    Reflux esophagitis    Salmonella    Stroke [facility])    Tinnitus    Right   Type 2 diabetes mellitus ([place])     Past Surgical History:  Procedure Laterality Date   BACK SURGERY     c-spine surgery     03/2007   CARDIAC CATHETERIZATION     CHOLECYSTECTOMY     COLONOSCOPY  04/21/2011; 05/29/11   6/12: [place] - ?AVM at IC valve, inflammatory changes at IC valve [facility]); 7/12 - [place]; IC valve erosions, look chronic and probably Crohn's per path   colonoscopy  2017   [place]: random biopsies normal. TI normal   ESOPHAGOGASTRODUODENOSCOPY  05/29/11   normal    ESOPHAGOGASTRODUODENOSCOPY N/A 01/21/2016   Dr. Michail Sermon: normal   EYE SURGERY Bilateral    cataracts removed   FEMUR IM NAIL Right 04/10/2018   Procedure: INTRAMEDULLARY (IM) NAIL FEMORAL;  Surgeon: Paralee Cancel, MD;  Location: [facility];  Service: Orthopedics;  Laterality: Right;   FRACTURE SURGERY     GIVENS CAPSULE STUDY  11/17/2020   HARDWARE  REMOVAL Right 04/10/2018   Procedure: HARDWARE REMOVAL RIGHT DISTAL FEMUR;  Surgeon: Paralee Cancel, MD;  Location: [facility];  Service: Orthopedics;  Laterality: Right;   HERNIA REPAIR     IR FLUORO GUIDE CV LINE RIGHT  04/01/2017   IR US GUIDE VASC ACCESS RIGHT  04/01/2017   JOINT REPLACEMENT Right    hip   OPEN REDUCTION INTERNAL FIXATION (ORIF) DISTAL RADIAL FRACTURE Right 05/28/2019   Procedure: OPEN REDUCTION INTERNAL FIXATION (ORIF) DISTAL RADIAL FRACTURE;  Surgeon: Verner Mould, MD;  Location: [facility];  Service: Orthopedics;  Laterality: Right;  with block 90 minutes   ORIF FEMUR FRACTURE Right 03/31/2017   Procedure: OPEN REDUCTION INTERNAL FIXATION (ORIF) DISTAL FEMUR FRACTURE;  Surgeon: Paralee Cancel, MD;  Location: [facility];  Service: Orthopedics;  Laterality: Right;   removal of kidney stone     Right knee arthroscopic surgery     TONSILLECTOMY     TOTAL ABDOMINAL HYSTERECTOMY      Family Hx:  Family History  Problem Relation Age of Onset   Diabetes Mother    Colon cancer Brother        POSSIBLY. Patient states diagnosed at age 4, then later states age 11. unclear and limited historian   Esophageal cancer Neg Hx    Rectal cancer Neg Hx    Stomach cancer Neg Hx     Social History:  reports that she has never smoked. She has never used smokeless tobacco. She reports that she does not drink alcohol and does not use drugs.  Allergies:  Allergies  Allergen Reactions   Cozaar [Losartan] Nausea And Vomiting, Swelling and Rash   Quinolones Itching, Nausea And Vomiting, Nausea Only and Swelling    Swelling of face, jaw, and lips     Cymbalta [Duloxetine Hcl] Itching and Swelling   Sulfa Antibiotics Nausea And Vomiting and Swelling    Headache  Swelling of feet, legs    Synalar [Fluocinolone] Swelling   Cipro [Ciprofloxacin Hcl] Nausea And Vomiting and Swelling    Swelling of face, jaw, lips   Diovan [Valsartan] Nausea And Vomiting and Swelling   Glucophage [Metformin] Other (See Comments)    GI upset   Ketalar [Ketamine] Swelling   Norco [Hydrocodone-Acetaminophen] Itching and Swelling   Norvasc [Amlodipine Besylate] Itching and Swelling    Fluid retention    Nsaids Nausea And Vomiting and Other (See Comments)    Has bleeding ulcers   Tape Itching and Rash    Paper tape can only be used on this patient   Zestril [Lisinopril] Swelling and Rash    Oral swelling Red streaks on arms/legs    Medications: Prior to Admission medications   Medication Sig Start Date End Date Taking? Authorizing Provider  acetaminophen (TYLENOL) 325 MG tablet Take 2 tablets (650 mg total) by mouth every 4 (four) hours. Patient taking differently: Take 650 mg by mouth every 4 (four) hours as needed for mild pain, moderate pain or fever. 12/01/21  Yes Johnson, Clanford L, MD  allopurinol (ZYLOPRIM) 100 MG tablet Take 100 mg by mouth daily. 03/20/21  Yes [provider]  atorvastatin (LIPITOR) 80 MG tablet Take 1 tablet (80 mg total) by mouth daily. Restart on 06/29/22 06/22/22  Yes Tat, Shanon Brow, MD  diphenhydrAMINE (BENADRYL) 2 % cream Apply topically 3 (three) times daily as needed for itching. 12/01/21  Yes Johnson, Clanford L, MD  epoetin alfa (EPOGEN) 3000 UNIT/ML injection Inject 3,000 Units into the skin once a week. 03/01/22  Yes [provider]  esomeprazole (NEXIUM) 40 MG capsule Take 40 mg by mouth daily before breakfast.     Yes [provider]  FARXIGA 10 MG TABS tablet Take 10 mg by mouth daily. 07/11/22  Yes [provider]  furosemide (LASIX) 40 MG tablet Take 1 tablet (40 mg total) by mouth daily.  06/22/22  Yes Tat, Shanon Brow, MD  glipiZIDE (GLUCOTROL) 10 MG tablet Take 10 mg by mouth daily. 09/30/22  Yes [provider]  hydrALAZINE (APRESOLINE) 50 MG tablet Take 1 tablet (50 mg total) by mouth every 8 (eight) hours. 10/21/22 11/20/22 Yes Shah, Pratik D, DO  insulin aspart (NOVOLOG) 100 UNIT/ML FlexPen Inject 5-11 Units into the skin 3 (three) times daily with meals. 07/09/22  Yes Reardon, Loree Fee J, NP  insulin detemir (LEVEMIR FLEXPEN) 100 UNIT/ML FlexPen Inject 20 Units into the skin at bedtime. 07/10/22  Yes Brita Romp, NP  levETIRAcetam (KEPPRA) 500 MG tablet Take 500 mg by mouth 2 (two) times daily.   Yes [provider]  lipase/protease/amylase (CREON) 36000 UNITS CPEP capsule Take 36,000 Units by mouth 3 (three) times daily with meals.   Yes [provider]  metoprolol succinate (TOPROL-XL) 25 MG 24 hr tablet Take 1 tablet (25 mg total) by mouth daily. 10/21/22 11/20/22 Yes Shahmehdi, Valeria Batman, MD  naloxone [facility]) nasal spray 4 mg/0.1 mL Place 0.4 mg into the nose once. 09/30/22  Yes [provider]  spironolactone (ALDACTONE) 25 MG tablet Take 25 mg by mouth daily. 10/31/22  Yes [provider]  venlafaxine XR (EFFEXOR-XR) 150 MG 24 hr capsule Take 150 mg by mouth daily. 07/11/22  Yes [provider]  Vitamin D, Ergocalciferol, (DRISDOL) 1.25 MG (50000 UNIT) CAPS capsule Take 50,000 Units by mouth once a week. 07/09/22  Yes [provider]  OZEMPIC, 1 MG/DOSE, 4 MG/3ML SOPN Inject 1 mg into the skin once a week. Patient not taking: Reported on 11/11/2022 09/30/22   [provider]  potassium chloride SA (KLOR-CON M) 20 MEQ tablet Take 20 mEq by mouth 3 (three) times a week. Patient not taking: Reported on 11/11/2022 07/11/22   [provider]  tiZANidine (ZANAFLEX) 4 MG tablet Take 4 mg by mouth 2 (two) times daily. Patient not taking: Reported on 11/11/2022 06/14/22   [provider]  ziprasidone  (GEODON) 20 MG capsule Take 20 mg by mouth 2 (two) times daily. Patient not taking: Reported on 11/11/2022 06/09/22   [provider]    I have reviewed the patient's current medications.  Labs:  Results for orders placed or performed during the hospital encounter of 11/11/22 (from the past 48 hour(s))  Comprehensive metabolic panel     Status: Abnormal   Collection Time: 11/11/22  6:18 PM  Result Value Ref Range   Sodium 116 (LL) 135 - 145 mmol/L    Comment: CRITICAL RESULT CALLED TO, READ BACK BY AND VERIFIED WITH: WHITE,M AT 1911 ON 11/11/2022 BY MOSLEY,J    Potassium 5.7 (H) 3.5 - 5.1 mmol/L   Chloride 82 (L) 98 - 111 mmol/L   CO2 19 (L) 22 - 32 mmol/L   Glucose, Bld 893 (HH) 70 - 99 mg/dL    Comment: Glucose reference range applies only to samples taken after fasting for at least 8 hours. CRITICAL RESULT CALLED TO, READ BACK BY AND VERIFIED WITH: WHITE,M AT 1911 ON 11/11/2022 BY MOSLEY,J    BUN 133 (H) 8 - 23 mg/dL    Comment: RESULTS CONFIRMED  BY MANUAL DILUTION   Creatinine, Ser 3.36 (H) 0.44 - 1.00 mg/dL   Calcium 10.0 8.9 - 10.3 mg/dL   Total Protein 7.8 6.5 - 8.1 g/dL   Albumin 3.5 3.5 - 5.0 g/dL   AST 18 15 - 41 U/L   ALT 14 0 - 44 U/L   Alkaline Phosphatase 172 (H) 38 - 126 U/L   Total Bilirubin 0.7 0.3 - 1.2 mg/dL   GFR, Estimated 14 (L) >60 mL/min    Comment: (NOTE) Calculated using the CKD-EPI Creatinine Equation (2021)    Anion gap 15 5 - 15    Comment: Performed at [facility], [address], [place], [place] [address]  CBC with Differential     Status: Abnormal   Collection Time: 11/11/22  6:18 PM  Result Value Ref Range   WBC 10.1 4.0 - 10.5 K/uL   RBC 3.54 (L) 3.87 - 5.11 MIL/uL   Hemoglobin 10.4 (L) 12.0 - 15.0 g/dL   HCT 31.4 (L) 36.0 - 46.0 %   MCV 88.7 80.0 - 100.0 fL   MCH 29.4 26.0 - 34.0 pg   MCHC 33.1 30.0 - 36.0 g/dL   RDW 14.5 11.5 - 15.5 %   Platelets 443 (H) 150 - 400 K/uL   nRBC 0.0 0.0 - 0.2 %   Neutrophils Relative % 80 %    Neutro Abs 8.1 (H) 1.7 - 7.7 K/uL   Lymphocytes Relative 9 %   Lymphs Abs 0.9 0.7 - 4.0 K/uL   Monocytes Relative 5 %   Monocytes Absolute 0.5 0.1 - 1.0 K/uL   Eosinophils Relative 1 %   Eosinophils Absolute 0.1 0.0 - 0.5 K/uL   Basophils Relative 1 %   Basophils Absolute 0.1 0.0 - 0.1 K/uL   Immature Granulocytes 4 %   Abs Immature Granulocytes 0.40 (H) 0.00 - 0.07 K/uL    Comment: Performed at [facility], [address], [place], [place] [address]  Beta-hydroxybutyric acid     Status: Abnormal   Collection Time: 11/11/22  6:18 PM  Result Value Ref Range   Beta-Hydroxybutyric Acid 0.85 (H) 0.05 - 0.27 mmol/L    Comment: Performed at [facility], [address] [place], [place] [address]  CBG monitoring, ED     Status: Abnormal   Collection Time: 11/11/22  7:49 PM  Result Value Ref Range   Glucose-Capillary >600 (HH) 70 - 99 mg/dL    Comment: Glucose reference range applies only to samples taken after fasting for at least 8 hours.  Basic metabolic panel     Status: Abnormal   Collection Time: 11/11/22  8:12 PM  Result Value Ref Range   Sodium 115 (LL) 135 - 145 mmol/L    Comment: CRITICAL RESULT CALLED TO, READ BACK BY AND VERIFIED WITH: MOSPELLER,M AT 2100 ON 11/11/2022 BY MOSLEY,J    Potassium 5.9 (H) 3.5 - 5.1 mmol/L   Chloride 85 (L) 98 - 111 mmol/L   CO2 14 (L) 22 - 32 mmol/L   Glucose, Bld 895 (HH) 70 - 99 mg/dL    Comment: Glucose reference range applies only to samples taken after fasting for at least 8 hours. CRITICAL RESULT CALLED TO, READ BACK BY AND VERIFIED WITH: MOSPELLER,M AT 2100 ON 11/11/2022 BY MOSLEY,J    BUN 132 (H) 8 - 23 mg/dL    Comment: RESULTS CONFIRMED BY MANUAL DILUTION   Creatinine, Ser 3.47 (H) 0.44 - 1.00 mg/dL   Calcium 9.6 8.9 - 10.3 mg/dL   GFR, Estimated  14 (L) >60 mL/min    Comment: (NOTE) Calculated using the CKD-EPI Creatinine Equation (2021)    Anion gap 16 (H) 5 - 15    Comment: Performed at [facility], [address],  [place], [place] [address]  CBG monitoring, ED     Status: Abnormal   Collection Time: 11/11/22  8:52 PM  Result Value Ref Range   Glucose-Capillary >600 (HH) 70 - 99 mg/dL    Comment: Glucose reference range applies only to samples taken after fasting for at least 8 hours.  CBG monitoring, ED     Status: Abnormal   Collection Time: 11/11/22  9:17 PM  Result Value Ref Range   Glucose-Capillary >600 (HH) 70 - 99 mg/dL    Comment: Glucose reference range applies only to samples taken after fasting for at least 8 hours.  Blood gas, venous     Status: Abnormal   Collection Time: 11/11/22  9:45 PM  Result Value Ref Range   FIO2 21.0 %   pH, Ven 7.32 7.25 - 7.43   pCO2, Ven 36 (L) 44 - 60 mmHg   pO2, Ven 36 32 - 45 mmHg   Bicarbonate 18.6 (L) 20.0 - 28.0 mmol/L   Acid-base deficit 7.0 (H) 0.0 - 2.0 mmol/L   O2 Saturation 58.5 %   Patient temperature 36.4    Collection site BLOOD RIGHT ARM    Drawn by 1     Comment: Performed at [facility], [address], [place], [place] [address]  CBG monitoring, ED     Status: Abnormal   Collection Time: 11/11/22  9:51 PM  Result Value Ref Range   Glucose-Capillary >600 (HH) 70 - 99 mg/dL    Comment: Glucose reference range applies only to samples taken after fasting for at least 8 hours.  CBG monitoring, ED     Status: Abnormal   Collection Time: 11/11/22 10:30 PM  Result Value Ref Range   Glucose-Capillary >600 (HH) 70 - 99 mg/dL    Comment: Glucose reference range applies only to samples taken after fasting for at least 8 hours.  CBG monitoring, ED     Status: Abnormal   Collection Time: 11/11/22 11:07 PM  Result Value Ref Range   Glucose-Capillary >600 (HH) 70 - 99 mg/dL    Comment: Glucose reference range applies only to samples taken after fasting for at least 8 hours.  CBG monitoring, ED     Status: Abnormal   Collection Time: 11/12/22 12:00 AM  Result Value Ref Range   Glucose-Capillary 567 (HH) 70 - 99 mg/dL    Comment: Glucose  reference range applies only to samples taken after fasting for at least 8 hours.   Comment 1 Notify RN   CBG monitoring, ED     Status: Abnormal   Collection Time: 11/12/22 12:49 AM  Result Value Ref Range   Glucose-Capillary 585 (HH) 70 - 99 mg/dL    Comment: Glucose reference range applies only to samples taken after fasting for at least 8 hours.   Comment 1 Notify RN   CBG monitoring, ED     Status: Abnormal   Collection Time: 11/12/22  1:31 AM  Result Value Ref Range   Glucose-Capillary 456 (H) 70 - 99 mg/dL    Comment: Glucose reference range applies only to samples taken after fasting for at least 8 hours.  CBG monitoring, ED     Status: Abnormal   Collection Time: 11/12/22  2:05 AM  Result Value Ref Range  Glucose-Capillary 436 (H) 70 - 99 mg/dL    Comment: Glucose reference range applies only to samples taken after fasting for at least 8 hours.  CBG monitoring, ED     Status: Abnormal   Collection Time: 11/12/22  2:54 AM  Result Value Ref Range   Glucose-Capillary 336 (H) 70 - 99 mg/dL    Comment: Glucose reference range applies only to samples taken after fasting for at least 8 hours.  CBG monitoring, ED     Status: Abnormal   Collection Time: 11/12/22  4:19 AM  Result Value Ref Range   Glucose-Capillary 226 (H) 70 - 99 mg/dL    Comment: Glucose reference range applies only to samples taken after fasting for at least 8 hours.  CBG monitoring, ED     Status: Abnormal   Collection Time: 11/12/22  6:14 AM  Result Value Ref Range   Glucose-Capillary 179 (H) 70 - 99 mg/dL    Comment: Glucose reference range applies only to samples taken after fasting for at least 8 hours.  CBG monitoring, ED     Status: Abnormal   Collection Time: 11/12/22  7:19 AM  Result Value Ref Range   Glucose-Capillary 142 (H) 70 - 99 mg/dL    Comment: Glucose reference range applies only to samples taken after fasting for at least 8 hours.  Beta-hydroxybutyric acid     Status: Abnormal   Collection  Time: 11/12/22  8:38 AM  Result Value Ref Range   Beta-Hydroxybutyric Acid 0.34 (H) 0.05 - 0.27 mmol/L    Comment: Performed at [facility], [address] [place], [place] [address]  Comprehensive metabolic panel     Status: Abnormal (Preliminary result)   Collection Time: 11/12/22  8:38 AM  Result Value Ref Range   Sodium 126 (L) 135 - 145 mmol/L    Comment: DELTA CHECK NOTED   Potassium 4.3 3.5 - 5.1 mmol/L    Comment: DELTA CHECK NOTED   Chloride 95 (L) 98 - 111 mmol/L   CO2 14 (L) 22 - 32 mmol/L   Glucose, Bld 178 (H) 70 - 99 mg/dL    Comment: Glucose reference range applies only to samples taken after fasting for at least 8 hours.   BUN PENDING 8 - 23 mg/dL   Creatinine, Ser 2.99 (H) 0.44 - 1.00 mg/dL   Calcium 10.0 8.9 - 10.3 mg/dL   Total Protein 7.7 6.5 - 8.1 g/dL   Albumin 3.6 3.5 - 5.0 g/dL   AST 22 15 - 41 U/L   ALT 14 0 - 44 U/L   Alkaline Phosphatase 171 (H) 38 - 126 U/L   Total Bilirubin 0.5 0.3 - 1.2 mg/dL   GFR, Estimated 16 (L) >60 mL/min    Comment: (NOTE) Calculated using the CKD-EPI Creatinine Equation (2021)    Anion gap 17 (H) 5 - 15    Comment: Performed at [facility], [address] [place], [place] [address]  CBC     Status: Abnormal   Collection Time: 11/12/22  8:38 AM  Result Value Ref Range   WBC 14.5 (H) 4.0 - 10.5 K/uL   RBC 3.69 (L) 3.87 - 5.11 MIL/uL   Hemoglobin 10.9 (L) 12.0 - 15.0 g/dL   HCT 32.5 (L) 36.0 - 46.0 %   MCV 88.1 80.0 - 100.0 fL   MCH 29.5 26.0 - 34.0 pg   MCHC 33.5 30.0 - 36.0 g/dL   RDW 14.3 11.5 - 15.5 %   Platelets 339 150 - 400  K/uL   nRBC 0.0 0.0 - 0.2 %    Comment: Performed at [facility], [address] [place], [place] [address]  Magnesium     Status: Abnormal   Collection Time: 11/12/22  8:38 AM  Result Value Ref Range   Magnesium 2.9 (H) 1.7 - 2.4 mg/dL    Comment: Performed at [facility], [address] [place], [place] [address]  Phosphorus     Status: None   Collection Time: 11/12/22  8:38 AM   Result Value Ref Range   Phosphorus 4.0 2.5 - 4.6 mg/dL    Comment: Performed at [facility], [address], [place], [place] [address]     ROS:  A comprehensive review of systems was negative except for: Musculoskeletal: positive for pain with right sided hip fracture  Physical Exam: Vitals:   11/12/22 0600 11/12/22 0846  BP: (!) 187/64   Pulse: 81   Resp: 15   Temp:  97.8 F (36.6 C)  SpO2: 98%      General: Alert, appears older than her stated age but currently in no acute distress HEENT: Pupils are equal round and reactive to light, extraocular motions are intact, mucous membranes moist, bruising on face consistent with history of fall  Neck: No JVD Heart: RRR Lungs: Clear to auscultation bilaterally Abdomen: Soft, nontender, nondistended Extremities: No edema Skin: Positive for tenting Neuro: Alert and in no acute distress  Assessment/Plan: 72 year old white female with multiple medical issues including advanced CKD, and failure to thrive.  She presents with acute on chronic renal failure, hyponatremia, hyperkalemia and acidosis in the setting of hyperglycemia Renal-baseline stage IV CKD with creatinine in the mid twos.  Patient has had many episodes in the past of acute on chronic renal failure.  She presents with another in the setting of hyperglycemia.  With treatment so far which includes insulin and fluids kidney function does appear to be improved.  No absolute indications for dialysis Hyperkalemia-was in the setting of hyperglycemia.  Has improved with treatment of sugar as well as LR Hyponatremia-pseudohyponatremia in the setting of hyperglycemia.  Has improved with insulin.  This is volume depleted hyponatremia 4. Hypertension/volume  -appears to be dry in the setting of hyperglycemia.  However, blood pressure is high?  Would continue with lactated Ringer's IVF 5. Anemia  -related to CKD.  Hemoglobin currently above 10 but would suspect will decrease in the  setting of hydration.  Supportive care for now   Louis Meckel 11/12/2022, 9:11 AM

## 2022-11-12 NOTE — NC FL2 (Signed)
[place] LEVEL OF CARE FORM     IDENTIFICATION  Patient Name: Heather [name] Birthdate: [birth year] Sex: female Admission Date (Current Location): 11/11/2022  [facility] and [place] Number:  [employer] and Address:  [facility] [address] [place]      Provider Number: [id number]  Attending Physician Name and Address:  Roxan Hockey, MD  Relative Name and Phone Number:  Kimoni, Pickerill (Spouse) [phone]    Current Level of Care: Hospital Recommended Level of Care: [place] Prior Approval Number:    Date Approved/Denied:   PASRR Number: [id number]                      date -10/19/2022- 11/18/2022  Discharge Plan:      Current Diagnoses: Patient Active Problem List   Diagnosis Date Noted   Pseudohyponatremia 11/12/2022   Thrombocytosis 11/12/2022   DKA (diabetic ketoacidosis) ([place]) 11/12/2022   Uncontrolled type 2 diabetes mellitus with hyperglycemia ([place]) 11/11/2022   AKI (acute kidney injury) ([place]) 10/17/2022   DKA, type 2 ([place]) 10/16/2022   History of right shoulder fracture 10/15/2022   Aortic atherosclerosis ([place]) 08/01/2022   Recurrent falls 07/04/2022   Humeral head fracture 07/04/2022   Right leg weakness 06/20/2022   Anxiety 06/20/2022   UTI (urinary tract infection) 06/20/2022   Malnutrition of moderate degree 06/20/2022   Hyperkalemia 06/19/2022   Type 2 diabetes mellitus with hyperosmolar nonketotic hyperglycemia ([place]) 05/27/2022   Acute kidney failure ([place]) 03/17/2022   Mixed diabetic hyperlipidemia associated with type 2 diabetes mellitus ([place]) 03/16/2022   Chronic diastolic CHF (congestive heart failure) ([place]) 03/16/2022   Decubitus ulcer of left buttock, stage 2 ([place]) 03/16/2022   Cellulitis of left hand 11/30/2021   Closed fracture of part of upper end of humerus 11/30/2021   At risk for falls 11/28/2021   Neoplasm of meninges 11/28/2021   Sepsis ([place]) 11/28/2021    Acute on chronic anemia 11/28/2021   Cerebral thrombosis with cerebral infarction 08/28/2021   Cerebrovascular accident (CVA) due to embolism of cerebral artery ([place])    Pressure injury of skin 08/26/2021   Acute respiratory failure with hypoxia ([place]) 35/46/5681   Acute metabolic encephalopathy 27/51/7001   Malignant hypertension 08/25/2021   Thrombocytopenia ([place]) 08/25/2021   Anemia due to chronic kidney disease 08/25/2021   Blurry vision, bilateral 08/16/2021   Hyponatremia 04/01/2021   COVID-19 03/30/2021   Senile osteoporosis 02/10/2021   Macrocytic anemia    E coli enteritis 01/09/2021   Acute pancreatitis 01/08/2021   Acute renal failure superimposed on stage 4 chronic kidney disease ([place]) 01/07/2021   Diarrhea    Acute hypokalemia 01/06/2021   Long-term use of immunosuppressant medication-anticipated 11/28/2020   Elevated blood-pressure reading, without diagnosis of hypertension 05/30/2020   Meningioma ([place]) 05/30/2020   Headache 05/17/2020   Hypokalemia 05/17/2020   Hypomagnesemia 05/17/2020   Dehydration 05/17/2020   Hypertensive urgency 05/17/2020   Visual disturbance 05/17/2020   Left hip pain 09/22/2019   Lumbar radiculopathy 09/22/2019   Postoperative anemia due to acute blood loss 09/18/2019   Postoperative urinary retention 09/18/2019   Closed fracture of distal end of radius 05/26/2019   Coronary arteriosclerosis 07/15/2018   Closed right hip fracture ([place]) 04/08/2018   Elevated lipase    Crohn's disease of small intestine without complication ([place])    Protein-calorie malnutrition, severe 03/05/2018   Femur fracture, right ([place]) 03/30/2017   Neck pain 03/12/2017   Acute kidney  injury superimposed on CKD ([place]) 01/25/2016   Neuropathy 03/02/2015   Pain in the chest    Chest pain 12/22/2014   Essential hypertension 12/22/2014   Nausea & vomiting 12/22/2014   Uncontrolled type 2 diabetes mellitus with hyperglycemia, with long-term current use of insulin  ([place]) 12/22/2014   Hiatal hernia 12/22/2014   GERD (gastroesophageal reflux disease) 12/22/2014   Gout 12/22/2014   Mixed hyperlipidemia 12/22/2014   Depression 12/22/2014   Type 2 diabetes mellitus without complications ([place]) 95/28/4132   Fusion of spine, cervicothoracic region 11/01/2014   Pseudophakia, left eye 10/20/2014   Chronic kidney disease (CKD), stage IV (severe) (HCC) 08/25/2014   Cervical stenosis of spine 08/26/2013   Vitamin B12 deficiency 07/08/2011   Personal history of failed moderate sedation 04/16/2011   Chronic Nausea and Vomiting - ? gastroapresis 04/16/2011   Early satiety 04/16/2011   Irritable bowel syndrome 04/16/2011   Benign paroxysmal positional vertigo 11/20/2010   TRICUSPID REGURGITATION, MODERATE 11/20/2010   PULMONARY HYPERTENSION, MILD 11/20/2010   LEG EDEMA, BILATERAL 11/20/2010   DYSPNEA 11/20/2010   NONSPECIFIC ABNORMAL ELECTROCARDIOGRAM 11/20/2010   CHEST WALL PAIN, HX OF 11/20/2010    Orientation RESPIRATION BLADDER Height & Weight     Self, Time, Situation  Normal Incontinent Weight: 59 kg Height:  '5\' 4"'$  (162.6 cm)  BEHAVIORAL SYMPTOMS/MOOD NEUROLOGICAL BOWEL NUTRITION STATUS      Continent Diet (See DC summary)  AMBULATORY STATUS COMMUNICATION OF NEEDS Skin   Extensive Assist Verbally Normal                       Personal Care Assistance Level of Assistance  Bathing, Feeding, Dressing Bathing Assistance: Limited assistance Feeding assistance: Limited assistance Dressing Assistance: Maximum assistance     Functional Limitations Info  Sight, Hearing, Speech Sight Info: Impaired Hearing Info: Impaired Speech Info: Adequate    SPECIAL CARE FACTORS FREQUENCY  PT (By licensed PT), OT (By licensed OT)     PT Frequency: 5 times a week OT Frequency: 3x a week            Contractures Contractures Info: Not present    Additional Factors Info  Code Status, Allergies, Psychotropic Code Status Info: FULL Allergies Info:  Cozaar (Losartan), Quinolones, Cymbalta (Duloxetine Hcl), Sulfa Antibiotics, Synalar (Fluocinolone), Cipro (Ciprofloxacin Hcl), Diovan (Valsartan), Glucophage (Metformin), Ketalar (Ketamine), Norco (Hydrocodone-acetaminophen), Norvasc (Amlodipine Besylate), Nsaids, Tape, Zestril (Lisinopril) Psychotropic Info: Buspar, Effexor, Geodon         Current Medications (11/12/2022):  This is the current hospital active medication list Current Facility-Administered Medications  Medication Dose Route Frequency Provider Last Rate Last Admin   0.9 %  sodium chloride infusion   Intravenous Continuous Adefeso, Oladapo, DO   Stopped at 11/12/22 0657   acetaminophen (TYLENOL) tablet 650 mg  650 mg Oral Q6H PRN Adefeso, Oladapo, DO       Or   acetaminophen (TYLENOL) suppository 650 mg  650 mg Rectal Q6H PRN Adefeso, Oladapo, DO       atorvastatin (LIPITOR) tablet 80 mg  80 mg Oral Daily Adefeso, Oladapo, DO   80 mg at 11/12/22 1058   dextrose 5 % in lactated ringers infusion   Intravenous Continuous Adefeso, Oladapo, DO 125 mL/hr at 11/12/22 1405 New Bag at 11/12/22 1405   dextrose 50 % solution 0-50 mL  0-50 mL Intravenous PRN Adefeso, Oladapo, DO       heparin injection 5,000 Units  5,000 Units Subcutaneous Q8H Adefeso, Oladapo, DO   5,000 Units  at 11/12/22 1302   hydrALAZINE (APRESOLINE) tablet 50 mg  50 mg Oral Q8H Adefeso, Oladapo, DO   50 mg at 11/12/22 1306   insulin regular, human (MYXREDLIN) 100 units/ 100 mL infusion   Intravenous Continuous Adefeso, Oladapo, DO 2.6 mL/hr at 11/12/22 1309 2.6 Units/hr at 11/12/22 1309   lactated ringers infusion   Intravenous Continuous Adefeso, Oladapo, DO   Stopped at 11/12/22 1212   metoprolol succinate (TOPROL-XL) 24 hr tablet 25 mg  25 mg Oral Daily Adefeso, Oladapo, DO   25 mg at 11/12/22 1058   ondansetron (ZOFRAN) tablet 4 mg  4 mg Oral Q6H PRN Adefeso, Oladapo, DO       Or   ondansetron (ZOFRAN) injection 4 mg  4 mg Intravenous Q6H PRN Adefeso, Oladapo, DO    4 mg at 11/12/22 0952   pantoprazole (PROTONIX) EC tablet 40 mg  40 mg Oral Daily Emokpae, [name], MD   40 mg at 11/12/22 1058   Current Outpatient Medications  Medication Sig Dispense Refill   acetaminophen (TYLENOL) 325 MG tablet Take 2 tablets (650 mg total) by mouth every 4 (four) hours. (Patient taking differently: Take 650 mg by mouth every 4 (four) hours as needed for mild pain, moderate pain or fever.)     allopurinol (ZYLOPRIM) 100 MG tablet Take 100 mg by mouth daily.     atorvastatin (LIPITOR) 80 MG tablet Take 1 tablet (80 mg total) by mouth daily. Restart on 06/29/22     diphenhydrAMINE (BENADRYL) 2 % cream Apply topically 3 (three) times daily as needed for itching. 30 g 0   epoetin alfa (EPOGEN) 3000 UNIT/ML injection Inject 3,000 Units into the skin once a week.     esomeprazole (NEXIUM) 40 MG capsule Take 40 mg by mouth daily before breakfast.       FARXIGA 10 MG TABS tablet Take 10 mg by mouth daily.     furosemide (LASIX) 40 MG tablet Take 1 tablet (40 mg total) by mouth daily. 30 tablet    glipiZIDE (GLUCOTROL) 10 MG tablet Take 10 mg by mouth daily.     hydrALAZINE (APRESOLINE) 50 MG tablet Take 1 tablet (50 mg total) by mouth every 8 (eight) hours. 90 tablet 0   insulin aspart (NOVOLOG) 100 UNIT/ML FlexPen Inject 5-11 Units into the skin 3 (three) times daily with meals. 30 mL 3   insulin detemir (LEVEMIR FLEXPEN) 100 UNIT/ML FlexPen Inject 20 Units into the skin at bedtime. 15 mL 2   levETIRAcetam (KEPPRA) 500 MG tablet Take 500 mg by mouth 2 (two) times daily.     lipase/protease/amylase (CREON) 36000 UNITS CPEP capsule Take 36,000 Units by mouth 3 (three) times daily with meals.     metoprolol succinate (TOPROL-XL) 25 MG 24 hr tablet Take 1 tablet (25 mg total) by mouth daily. 30 tablet 0   naloxone (NARCAN) nasal spray 4 mg/0.1 mL Place 0.4 mg into the nose once.     spironolactone (ALDACTONE) 25 MG tablet Take 25 mg by mouth daily.     venlafaxine XR (EFFEXOR-XR) 150  MG 24 hr capsule Take 150 mg by mouth daily.     Vitamin D, Ergocalciferol, (DRISDOL) 1.25 MG (50000 UNIT) CAPS capsule Take 50,000 Units by mouth once a week.     OZEMPIC, 1 MG/DOSE, 4 MG/3ML SOPN Inject 1 mg into the skin once a week. (Patient not taking: Reported on 11/11/2022)     potassium chloride SA (KLOR-CON M) 20 MEQ tablet Take 20 mEq by  mouth 3 (three) times a week. (Patient not taking: Reported on 11/11/2022)     tiZANidine (ZANAFLEX) 4 MG tablet Take 4 mg by mouth 2 (two) times daily. (Patient not taking: Reported on 11/11/2022)     ziprasidone (GEODON) 20 MG capsule Take 20 mg by mouth 2 (two) times daily. (Patient not taking: Reported on 11/11/2022)       Discharge Medications: Please see discharge summary for a list of discharge medications.  Relevant Imaging Results:  Relevant Lab Results:   Additional Information SS# [id number]  Boneta Lucks, RN

## 2022-11-12 NOTE — Evaluation (Signed)
Physical Therapy Evaluation Patient Details Name: Heather [name] MRN: [id number] DOB: 04-16-1951 Today's Date: 11/12/2022  History of Present Illness  72 y.o. F admitted on 11/11/22 due to falling at home. She was found to have a new R sided hip fracture, creatinine at 3.3, and blood sugar over 500. PMH significant for hypertension, poorly controlled type 2 diabetes mellitus, chronic diastolic heart failure, anxiety/depression and also stage IV CKD   Clinical Impression  Patient demonstrates slow labored movement for sitting up at bedside, very unsteady on feet and limited to a few steps at bedside before having to sit due to BLE weakness with knees buckling.  Patient tolerated sitting up in chair briefly before requesting to go back to bed due to fatigue and mild nausea.  Patient given nausea medication by RN prior to therapy.  Patient will benefit from continued skilled physical therapy in hospital and recommended venue below to increase strength, balance, endurance for safe ADLs and gait.         Recommendations for follow up therapy are one component of a multi-disciplinary discharge planning process, led by the attending physician.  Recommendations may be updated based on patient status, additional functional criteria and insurance authorization.  Follow Up Recommendations Skilled nursing-short term rehab (<3 hours/day) Can patient physically be transported by private vehicle: Yes    Assistance Recommended at Discharge Intermittent Supervision/Assistance  Patient can return home with the following  A lot of help with walking and/or transfers;A lot of help with bathing/dressing/bathroom;Assistance with cooking/housework;Help with stairs or ramp for entrance    Equipment Recommendations None recommended by PT  Recommendations for Other Services       Functional Status Assessment Patient has had a recent decline in their functional status and demonstrates the ability to make  significant improvements in function in a reasonable and predictable amount of time.     Precautions / Restrictions Precautions Precautions: Fall Precaution Comments: patient is supposed to be using L knee immobilizer but she has it at home Restrictions Weight Bearing Restrictions: No      Mobility  Bed Mobility Overal bed mobility: Needs Assistance Bed Mobility: Supine to Sit, Sit to Supine     Supine to sit: Min assist Sit to supine: Modified independent (Device/Increase time)   General bed mobility comments: increased time, labored movement    Transfers Overall transfer level: Needs assistance Equipment used: Rolling walker (2 wheels) Transfers: Sit to/from Stand, Bed to chair/wheelchair/BSC Sit to Stand: Mod assist   Step pivot transfers: Mod assist       General transfer comment: unsteady labored movement wit buckling of knees    Ambulation/Gait Ambulation/Gait assistance: Mod assist Gait Distance (Feet): 5 Feet Assistive device: Rolling walker (2 wheels) Gait Pattern/deviations: Decreased step length - right, Decreased step length - left, Decreased stride length, Knees buckling Gait velocity: slow     General Gait Details: limited to a few steps at bedside due to c/o fatigue, BLE weakness with knees buckling  Stairs            Wheelchair Mobility    Modified Rankin (Stroke Patients Only)       Balance Overall balance assessment: Needs assistance Sitting-balance support: No upper extremity supported, Feet supported Sitting balance-Leahy Scale: Fair Sitting balance - Comments: seated EOB   Standing balance support: Reliant on assistive device for balance, During functional activity, Bilateral upper extremity supported Standing balance-Leahy Scale: Poor Standing balance comment: fair/poor using RW  Pertinent Vitals/Pain Pain Assessment Pain Assessment: Faces Faces Pain Scale: Hurts little more Pain  Location: left upper arm with pressure, very swollen Pain Descriptors / Indicators: Discomfort, Guarding, Grimacing, Sore Pain Intervention(s): Limited activity within patient's tolerance, Monitored during session, Repositioned    Home Living Family/patient expects to be discharged to:: Private residence Living Arrangements: Spouse/significant other Available Help at Discharge: Family;Available 24 hours/day Type of Home: House Home Access: Ramped entrance       Home Layout: One level Home Equipment: Rollator (4 wheels);Crutches;Tub bench;Grab bars - tub/shower;Rolling Walker (2 wheels);Cane - quad;Cane - single point Additional Comments: Per previous documentation.    Prior Function Prior Level of Function : Needs assist       Physical Assist : Mobility (physical);ADLs (physical) Mobility (physical): Gait;Bed mobility;Transfers;Stairs   Mobility Comments: household ambulator using RW ADLs Comments: Was having assist for ADL's at rehab, reports she's been doing what she can at home.     Hand Dominance   Dominant Hand: Right    Extremity/Trunk Assessment   Upper Extremity Assessment Upper Extremity Assessment: Defer to OT evaluation RUE Deficits / Details: History of R shoulder fracture. Limited to 2+/5 shoulder flexion P/ROM and A/ROM. Generally weak below the shoulder. LUE Deficits / Details: Generally weak.    Lower Extremity Assessment Lower Extremity Assessment: Generalized weakness    Cervical / Trunk Assessment Cervical / Trunk Assessment: Kyphotic  Communication   Communication: No difficulties  Cognition Arousal/Alertness: Awake/alert Behavior During Therapy: WFL for tasks assessed/performed Overall Cognitive Status: No family/caregiver present to determine baseline cognitive functioning                                 General Comments: Patient apprehensive to get up        General Comments General comments (skin integrity, edema, etc.):  VSS on RA    Exercises     Assessment/Plan    PT Assessment Patient needs continued PT services  PT Problem List Decreased strength;Decreased activity tolerance;Decreased balance;Decreased mobility       PT Treatment Interventions DME instruction;Gait training;Stair training;Functional mobility training;Therapeutic activities;Therapeutic exercise;Balance training;Patient/family education    PT Goals (Current goals can be found in the Care Plan section)  Acute Rehab PT Goals Patient Stated Goal: return home PT Goal Formulation: With patient Time For Goal Achievement: 11/26/22 Potential to Achieve Goals: Good    Frequency Min 3X/week     Co-evaluation PT/OT/SLP Co-Evaluation/Treatment: Yes Reason for Co-Treatment: To address functional/ADL transfers PT goals addressed during session: Mobility/safety with mobility;Balance;Proper use of DME;Strengthening/ROM         AM-PAC PT "6 Clicks" Mobility  Outcome Measure Help needed turning from your back to your side while in a flat bed without using bedrails?: A Little Help needed moving from lying on your back to sitting on the side of a flat bed without using bedrails?: A Little Help needed moving to and from a bed to a chair (including a wheelchair)?: A Lot Help needed standing up from a chair using your arms (e.g., wheelchair or bedside chair)?: A Lot Help needed to walk in hospital room?: A Lot Help needed climbing 3-5 steps with a railing? : A Lot 6 Click Score: 14    End of Session         PT Visit Diagnosis: Unsteadiness on feet (R26.81);Other abnormalities of gait and mobility (R26.89);Muscle weakness (generalized) (M62.81)    Time: 2706-2376 PT Time Calculation (min) (ACUTE  ONLY): 28 min   Charges:   PT Evaluation $PT Eval Moderate Complexity: 1 Mod PT Treatments $Therapeutic Activity: 23-37 mins        11:38 AM, 11/12/22 Lonell Grandchild, MPT Physical Therapist with [facility] [id number]  [phone] office [phone] mobile phone

## 2022-11-12 NOTE — Progress Notes (Signed)
Husband notified of patient's admission. Made him aware that she is in ICU room 9, on the second floor.

## 2022-11-12 NOTE — TOC Initial Note (Addendum)
Transition of Care [facility]) - Initial/Assessment Note    Patient Details  Name: Heather [name] MRN: [id number] Date of Birth: [birth year]  Transition of Care [facility]) CM/SW Contact:    Boneta Lucks, RN Phone Number: 11/12/2022, 4:09 PM  Clinical Narrative:   Patient admitted with uncontrolled type 2 diabetes. Last admission patient went home instead of SNF. PT is recommending SNF. CM spoke with her husband, he state she will have to go to SNF this time and get rehab to get stronger. He is requesting [facility]. FL2 sent out for bed offers. PASSR expires 11/18/22 TOC to follow          INS Auth started        Expected Discharge Plan: [place] Barriers to Discharge: Continued Medical Work up   Patient Goals and CMS Choice Patient states their goals for this hospitalization and ongoing recovery are:: husband want SNF CMS Medicare.gov Compare Post Acute Care list provided to:: Patient Represenative (must comment) Choice offered to / list presented to : Spouse     Expected Discharge Plan and Services      Living arrangements for the past 2 months: Mobile Home                     Prior Living Arrangements/Services Living arrangements for the past 2 months: Mobile Home Lives with:: Spouse Patient language and need for interpreter reviewed:: Yes        Need for Family Participation in Patient Care: Yes (Comment) Care giver support system in place?: Yes (comment) Current home services: DME Criminal Activity/Legal Involvement Pertinent to Current Situation/Hospitalization: No - Comment as needed  Activities of Daily Living   Permission Sought/Granted      Husband     Emotional Assessment       Orientation: : Oriented to Self Alcohol / Substance Use: Not Applicable Psych Involvement: No (comment)  Admission diagnosis:  Uncontrolled type 2 diabetes mellitus with hyperglycemia ([place]) [E11.65] DKA (diabetic ketoacidosis) ([place]) [E11.10] Patient Active  Problem List   Diagnosis Date Noted   Pseudohyponatremia 11/12/2022   Thrombocytosis 11/12/2022   DKA (diabetic ketoacidosis) ([place]) 11/12/2022   Uncontrolled type 2 diabetes mellitus with hyperglycemia ([place]) 11/11/2022   AKI (acute kidney injury) ([place]) 10/17/2022   DKA, type 2 ([place]) 10/16/2022   History of right shoulder fracture 10/15/2022   Aortic atherosclerosis ([place]) 08/01/2022   Recurrent falls 07/04/2022   Humeral head fracture 07/04/2022   Right leg weakness 06/20/2022   Anxiety 06/20/2022   UTI (urinary tract infection) 06/20/2022   Malnutrition of moderate degree 06/20/2022   Hyperkalemia 06/19/2022   Type 2 diabetes mellitus with hyperosmolar nonketotic hyperglycemia ([place]) 05/27/2022   Acute kidney failure ([place]) 03/17/2022   Mixed diabetic hyperlipidemia associated with type 2 diabetes mellitus ([place]) 03/16/2022   Chronic diastolic CHF (congestive heart failure) ([place]) 03/16/2022   Decubitus ulcer of left buttock, stage 2 ([place]) 03/16/2022   Cellulitis of left hand 11/30/2021   Closed fracture of part of upper end of humerus 11/30/2021   At risk for falls 11/28/2021   Neoplasm of meninges 11/28/2021   Sepsis ([place]) 11/28/2021   Acute on chronic anemia 11/28/2021   Cerebral thrombosis with cerebral infarction 08/28/2021   Cerebrovascular accident (CVA) due to embolism of cerebral artery ([place])    Pressure injury of skin 08/26/2021   Acute respiratory failure with hypoxia ([place]) 11/06/3233   Acute metabolic encephalopathy 57/32/2025   Malignant hypertension 08/25/2021   Thrombocytopenia ([place]) 08/25/2021  Anemia due to chronic kidney disease 08/25/2021   Blurry vision, bilateral 08/16/2021   Hyponatremia 04/01/2021   COVID-19 03/30/2021   Senile osteoporosis 02/10/2021   Macrocytic anemia    E coli enteritis 01/09/2021   Acute pancreatitis 01/08/2021   Acute renal failure superimposed on stage 4 chronic kidney disease ([place]) 01/07/2021   Diarrhea    Acute hypokalemia  01/06/2021   Long-term use of immunosuppressant medication-anticipated 11/28/2020   Elevated blood-pressure reading, without diagnosis of hypertension 05/30/2020   Meningioma ([place]) 05/30/2020   Headache 05/17/2020   Hypokalemia 05/17/2020   Hypomagnesemia 05/17/2020   Dehydration 05/17/2020   Hypertensive urgency 05/17/2020   Visual disturbance 05/17/2020   Left hip pain 09/22/2019   Lumbar radiculopathy 09/22/2019   Postoperative anemia due to acute blood loss 09/18/2019   Postoperative urinary retention 09/18/2019   Closed fracture of distal end of radius 05/26/2019   Coronary arteriosclerosis 07/15/2018   Closed right hip fracture ([place]) 04/08/2018   Elevated lipase    Crohn's disease of small intestine without complication ([place])    Protein-calorie malnutrition, severe 03/05/2018   Femur fracture, right ([place]) 03/30/2017   Neck pain 03/12/2017   Acute kidney injury superimposed on CKD ([place]) 01/25/2016   Neuropathy 03/02/2015   Pain in the chest    Chest pain 12/22/2014   Essential hypertension 12/22/2014   Nausea & vomiting 12/22/2014   Uncontrolled type 2 diabetes mellitus with hyperglycemia, with long-term current use of insulin ([place]) 12/22/2014   Hiatal hernia 12/22/2014   GERD (gastroesophageal reflux disease) 12/22/2014   Gout 12/22/2014   Mixed hyperlipidemia 12/22/2014   Depression 12/22/2014   Type 2 diabetes mellitus without complications ([place]) 63/14/9702   Fusion of spine, cervicothoracic region 11/01/2014   Pseudophakia, left eye 10/20/2014   Chronic kidney disease (CKD), stage IV (severe) (HCC) 08/25/2014   Cervical stenosis of spine 08/26/2013   Vitamin B12 deficiency 07/08/2011   Personal history of failed moderate sedation 04/16/2011   Chronic Nausea and Vomiting - ? gastroapresis 04/16/2011   Early satiety 04/16/2011   Irritable bowel syndrome 04/16/2011   Benign paroxysmal positional vertigo 11/20/2010   TRICUSPID REGURGITATION, MODERATE 11/20/2010    PULMONARY HYPERTENSION, MILD 11/20/2010   LEG EDEMA, BILATERAL 11/20/2010   DYSPNEA 11/20/2010   NONSPECIFIC ABNORMAL ELECTROCARDIOGRAM 11/20/2010   CHEST WALL PAIN, HX OF 11/20/2010   PCP:  [name], MD Pharmacy:   [place], [place] [place] [address] Phone: [phone] Fax: [place], [place] [address] Phone: [phone] Fax: [phone]     Social Determinants of Health (SDOH) Social History: [place]: No Food Insecurity (10/16/2022)  Housing: Low Risk  (10/16/2022)  Transportation Needs: No Transportation Needs (10/16/2022)  Utilities: Not At Risk (10/16/2022)  Tobacco Use: Low Risk  (11/11/2022)   SDOH Interventions:   Readmission Risk Interventions    11/12/2022    4:06 PM 10/18/2022   12:42 PM 07/05/2022   11:42 AM  Readmission Risk Prevention Plan  Transportation Screening Complete Complete Complete  Medication Review Press photographer) Complete Complete Complete  PCP or Specialist appointment within 3-5 days of discharge Not Complete    HRI or Home Care Consult Complete Complete Complete  SW Recovery Care/Counseling Consult Complete Complete Complete  Palliative Care Screening Not Applicable Not Applicable Not Applicable  Skilled Nursing Facility Complete Complete Not Applicable

## 2022-11-12 NOTE — Progress Notes (Signed)
  Transition of Care [facility]) Screening Note   Patient Details  Name: Heather [name] Date of Birth: [birth year]   Transition of Care [facility]) CM/SW Contact:    Iona Beard, [place] Phone Number: 11/12/2022, 10:02 AM    Transition of Care Department [facility]) has reviewed patient and no TOC needs have been identified at this time. We will continue to monitor patient advancement through interdisciplinary progression rounds. If new patient transition needs arise, please place a TOC consult.

## 2022-11-12 NOTE — Evaluation (Signed)
Occupational Therapy Evaluation Patient Details Name: Heather [name] MRN: [id number] DOB: May 31, 1951 Today's Date: 11/12/2022   History of Present Illness 72 y.o. F admitted on 11/11/22 due to falling at home. She was found to have a new R sided hip fracture, creatinine at 3.3, and blood sugar over 500. PMH significant for hypertension, poorly controlled type 2 diabetes mellitus, chronic diastolic heart failure, anxiety/depression and also stage IV CKD   Clinical Impression   Pt presenting with concerns listed above. PTA pt reports she had recently been at a SNF for rehab and had returned home. She states that she was near her baseline, however still had some weakness. At this time, she presents with increased weakness and balance deficits, requiring up to mod A for functional mobility and ADL's. Pt at times self limiting and not wanting to participate in any mobility despite education on importance of mobility. Recommending SNF at this time to maximize independence and safety. OT will follow acutely.       Recommendations for follow up therapy are one component of a multi-disciplinary discharge planning process, led by the attending physician.  Recommendations may be updated based on patient status, additional functional criteria and insurance authorization.   Follow Up Recommendations  Skilled nursing-short term rehab (<3 hours/day)     Assistance Recommended at Discharge Frequent or constant Supervision/Assistance  Patient can return home with the following A lot of help with walking and/or transfers;A lot of help with bathing/dressing/bathroom;Assistance with cooking/housework;Assist for transportation;Help with stairs or ramp for entrance    Functional Status Assessment  Patient has had a recent decline in their functional status and demonstrates the ability to make significant improvements in function in a reasonable and predictable amount of time.  Equipment Recommendations  None  recommended by OT    Recommendations for Other Services       Precautions / Restrictions Precautions Precautions: Fall Restrictions Weight Bearing Restrictions: No      Mobility Bed Mobility Overal bed mobility: Needs Assistance Bed Mobility: Supine to Sit, Sit to Supine     Supine to sit: Min assist Sit to supine: Modified independent (Device/Increase time)   General bed mobility comments: Min A to pull to sitting, no assist to return to supine, total A to get pulled up in bed.    Transfers Overall transfer level: Needs assistance Equipment used: Rolling walker (2 wheels) Transfers: Sit to/from Stand, Bed to chair/wheelchair/BSC Sit to Stand: Mod assist     Step pivot transfers: Mod assist     General transfer comment: Mod A to stand and maintain balance      Balance Overall balance assessment: Needs assistance Sitting-balance support: No upper extremity supported, Feet supported Sitting balance-Leahy Scale: Fair     Standing balance support: Reliant on assistive device for balance, Bilateral upper extremity supported, During functional activity Standing balance-Leahy Scale: Poor                             ADL either performed or assessed with clinical judgement   ADL Overall ADL's : Needs assistance/impaired Eating/Feeding: Set up;Sitting;NPO   Grooming: Set up;Sitting   Upper Body Bathing: Min guard;Sitting   Lower Body Bathing: Moderate assistance;Sitting/lateral leans;Sit to/from stand   Upper Body Dressing : Min guard;Sitting   Lower Body Dressing: Moderate assistance;Sitting/lateral leans;Sit to/from stand   Toilet Transfer: Moderate assistance;+2 for safety/equipment;Stand-pivot   Toileting- Clothing Manipulation and Hygiene: Moderate assistance;Sitting/lateral lean;Sit to/from stand  Functional mobility during ADLs: Moderate assistance General ADL Comments: Limted due to weakness and balance deficits. Pt appears to be  self limiting as well, not wanting to participatee in any mobility.     Vision Baseline Vision/History: 1 Wears glasses Ability to See in Adequate Light: 1 Impaired Patient Visual Report: No change from baseline Vision Assessment?: No apparent visual deficits     Perception Perception Perception Tested?: No   Praxis Praxis Praxis tested?: Not tested    Pertinent Vitals/Pain Pain Assessment Pain Assessment: No/denies pain     Hand Dominance Right   Extremity/Trunk Assessment Upper Extremity Assessment Upper Extremity Assessment: Generalized weakness RUE Deficits / Details: History of R shoulder fracture. Limited to 2+/5 shoulder flexion P/ROM and A/ROM. Generally weak below the shoulder. LUE Deficits / Details: Generally weak.   Lower Extremity Assessment Lower Extremity Assessment: Defer to PT evaluation   Cervical / Trunk Assessment Cervical / Trunk Assessment: Kyphotic   Communication Communication Communication: No difficulties   Cognition Arousal/Alertness: Awake/alert Behavior During Therapy: WFL for tasks assessed/performed Overall Cognitive Status: No family/caregiver present to determine baseline cognitive functioning                                 General Comments: Pt reporting that she has walked, however she has not since arriving in the ED. Stated that the year was 1970     General Comments  VSS on RA    Exercises     Shoulder Instructions      Home Living Family/patient expects to be discharged to:: Private residence Living Arrangements: Spouse/significant other Available Help at Discharge: Family;Available 24 hours/day Type of Home: House Home Access: Ramped entrance     Home Layout: One level     Bathroom Shower/Tub: Teacher, early years/pre: Handicapped height Bathroom Accessibility: Yes How Accessible: Accessible via walker Home Equipment: Rollator (4 wheels);Crutches;Tub bench;Grab bars - tub/shower;Rolling  Walker (2 wheels);Cane - quad;Cane - single point          Prior Functioning/Environment Prior Level of Function : Needs assist             Mobility Comments: Recently in SNF for Rehab ADLs Comments: Was having assist for ADL's at rehab, reports she's been doing what she can at home.        OT Problem List: Decreased strength;Decreased range of motion;Decreased activity tolerance;Impaired balance (sitting and/or standing);Impaired vision/perception;Impaired UE functional use      OT Treatment/Interventions: Self-care/ADL training;Therapeutic exercise;Therapeutic activities;Patient/family education;Balance training;Visual/perceptual remediation/compensation    OT Goals(Current goals can be found in the care plan section) Acute Rehab OT Goals Patient Stated Goal: To feel better OT Goal Formulation: With patient Time For Goal Achievement: 11/26/22 Potential to Achieve Goals: Fair ADL Goals Pt Will Perform Grooming: with modified independence;standing Pt Will Perform Lower Body Bathing: with modified independence;sitting/lateral leans;sit to/from stand Pt Will Perform Lower Body Dressing: with modified independence;sitting/lateral leans;sit to/from stand Pt Will Transfer to Toilet: with modified independence;ambulating Pt Will Perform Toileting - Clothing Manipulation and hygiene: with modified independence;sitting/lateral leans;sit to/from stand  OT Frequency: Min 2X/week    Co-evaluation              AM-PAC OT "6 Clicks" Daily Activity     Outcome Measure Help from another person eating meals?: A Little Help from another person taking care of personal grooming?: A Little Help from another person toileting, which includes using toliet, bedpan,  or urinal?: A Lot Help from another person bathing (including washing, rinsing, drying)?: A Lot Help from another person to put on and taking off regular upper body clothing?: A Little Help from another person to put on and  taking off regular lower body clothing?: A Lot 6 Click Score: 15   End of Session Equipment Utilized During Treatment: Rolling walker (2 wheels) Nurse Communication: Mobility status  Activity Tolerance: Patient tolerated treatment well Patient left: in bed;with call bell/phone within reach  OT Visit Diagnosis: Unsteadiness on feet (R26.81);Other abnormalities of gait and mobility (R26.89);Muscle weakness (generalized) (M62.81)                Time: 4210-3128 OT Time Calculation (min): 15 min Charges:  OT General Charges $OT Visit: 1 Visit OT Evaluation $OT Eval Moderate Complexity: [place], OTR/L [facility] Acute Rehab  Alaynah Schutter [place]ane Yolanda Bonine 11/12/2022, 11:04 AM

## 2022-11-13 DIAGNOSIS — E1165 Type 2 diabetes mellitus with hyperglycemia: Secondary | ICD-10-CM | POA: Diagnosis not present

## 2022-11-13 LAB — GLUCOSE, CAPILLARY
Glucose-Capillary: 167 mg/dL — ABNORMAL HIGH (ref 70–99)
Glucose-Capillary: 189 mg/dL — ABNORMAL HIGH (ref 70–99)
Glucose-Capillary: 206 mg/dL — ABNORMAL HIGH (ref 70–99)
Glucose-Capillary: 171 mg/dL — ABNORMAL HIGH (ref 70–99)
Glucose-Capillary: 195 mg/dL — ABNORMAL HIGH (ref 70–99)
Glucose-Capillary: 170 mg/dL — ABNORMAL HIGH (ref 70–99)
Glucose-Capillary: 178 mg/dL — ABNORMAL HIGH (ref 70–99)
Glucose-Capillary: 147 mg/dL — ABNORMAL HIGH (ref 70–99)
Glucose-Capillary: 159 mg/dL — ABNORMAL HIGH (ref 70–99)
Glucose-Capillary: 151 mg/dL — ABNORMAL HIGH (ref 70–99)
Glucose-Capillary: 168 mg/dL — ABNORMAL HIGH (ref 70–99)
Glucose-Capillary: 196 mg/dL — ABNORMAL HIGH (ref 70–99)

## 2022-11-13 LAB — CBC
HCT: 24.3 % — ABNORMAL LOW (ref 36.0–46.0)
Hemoglobin: 8.3 g/dL — ABNORMAL LOW (ref 12.0–15.0)
MCH: 29.9 pg (ref 26.0–34.0)
MCHC: 34.2 g/dL (ref 30.0–36.0)
MCV: 87.4 fL (ref 80.0–100.0)
Platelets: 266 10*3/uL (ref 150–400)
RBC: 2.78 MIL/uL — ABNORMAL LOW (ref 3.87–5.11)
RDW: 14.4 % (ref 11.5–15.5)
WBC: 10.7 10*3/uL — ABNORMAL HIGH (ref 4.0–10.5)
nRBC: 0 % (ref 0.0–0.2)

## 2022-11-13 LAB — RENAL FUNCTION PANEL
Albumin: 2.6 g/dL — ABNORMAL LOW (ref 3.5–5.0)
Anion gap: 11 (ref 5–15)
BUN: 116 mg/dL — ABNORMAL HIGH (ref 8–23)
CO2: 18 mmol/L — ABNORMAL LOW (ref 22–32)
Calcium: 9.1 mg/dL (ref 8.9–10.3)
Chloride: 99 mmol/L (ref 98–111)
Creatinine, Ser: 2.56 mg/dL — ABNORMAL HIGH (ref 0.44–1.00)
GFR, Estimated: 20 mL/min — ABNORMAL LOW (ref >60)
Glucose, Bld: 179 mg/dL — ABNORMAL HIGH (ref 70–99)
Phosphorus: 2.8 mg/dL (ref 2.5–4.6)
Potassium: 3.7 mmol/L (ref 3.5–5.1)
Sodium: 128 mmol/L — ABNORMAL LOW (ref 135–145)

## 2022-11-13 MED ORDER — LABETALOL HCL 5 MG/ML IV SOLN
10.0000 mg | INTRAVENOUS | Status: DC | PRN
  Administered 2022-11-13: 10 mg via INTRAVENOUS
  Filled 2022-11-13: qty 4

## 2022-11-13 MED ORDER — HYDROXYZINE HCL 10 MG PO TABS
10.0000 mg | ORAL_TABLET | Freq: Once | ORAL | Status: AC | PRN
  Administered 2022-11-13: 10 mg via ORAL
  Filled 2022-11-13: qty 1

## 2022-11-13 MED ORDER — ORAL CARE MOUTH RINSE: 15.0000 mL | OROMUCOSAL | Status: DC | PRN

## 2022-11-13 MED ORDER — INSULIN DETEMIR 100 UNIT/ML ~~LOC~~ SOLN
10.0000 [IU] | Freq: Every day | SUBCUTANEOUS | Status: DC
  Administered 2022-11-13 – 2022-11-14 (×2): 10 [IU] via SUBCUTANEOUS
  Filled 2022-11-13 (×3): qty 0.1

## 2022-11-13 MED ORDER — OXYCODONE HCL 5 MG PO TABS
5.0000 mg | ORAL_TABLET | Freq: Three times a day (TID) | ORAL | Status: DC | PRN
  Administered 2022-11-13 – 2022-11-14 (×2): 5 mg via ORAL
  Filled 2022-11-13 (×2): qty 1

## 2022-11-13 MED ORDER — INSULIN ASPART 100 UNIT/ML IJ SOLN
0.0000 [IU] | Freq: Three times a day (TID) | INTRAMUSCULAR | Status: DC
  Administered 2022-11-13 (×2): 2 [IU] via SUBCUTANEOUS
  Administered 2022-11-14: 5 [IU] via SUBCUTANEOUS
  Administered 2022-11-14: 7 [IU] via SUBCUTANEOUS

## 2022-11-13 MED ORDER — INSULIN ASPART 100 UNIT/ML IJ SOLN: 12.0000 [IU] | Freq: Once | INTRAMUSCULAR | Status: DC

## 2022-11-13 MED ORDER — ALLOPURINOL 100 MG PO TABS
100.0000 mg | ORAL_TABLET | Freq: Every day | ORAL | Status: DC
  Administered 2022-11-14: 100 mg via ORAL
  Filled 2022-11-13: qty 1

## 2022-11-13 MED ORDER — VENLAFAXINE HCL ER 75 MG PO CP24
150.0000 mg | ORAL_CAPSULE | Freq: Every day | ORAL | Status: DC
  Administered 2022-11-13 – 2022-11-14 (×2): 150 mg via ORAL
  Filled 2022-11-13 (×2): qty 2

## 2022-11-13 MED ORDER — DIPHENHYDRAMINE HCL 25 MG PO CAPS
25.0000 mg | ORAL_CAPSULE | Freq: Once | ORAL | Status: AC
  Administered 2022-11-13: 25 mg via ORAL
  Filled 2022-11-13: qty 1

## 2022-11-13 MED ORDER — DARBEPOETIN ALFA 300 MCG/0.6ML IJ SOSY
200.0000 ug | PREFILLED_SYRINGE | INTRAMUSCULAR | Status: DC
  Administered 2022-11-13: 200 ug via SUBCUTANEOUS
  Filled 2022-11-13: qty 0.6

## 2022-11-13 MED ORDER — LEVETIRACETAM 500 MG PO TABS
500.0000 mg | ORAL_TABLET | Freq: Two times a day (BID) | ORAL | Status: DC
  Administered 2022-11-13 – 2022-11-14 (×3): 500 mg via ORAL
  Filled 2022-11-13 (×3): qty 1

## 2022-11-13 MED ORDER — INSULIN ASPART 100 UNIT/ML IJ SOLN: 0.0000 [IU] | Freq: Every day | INTRAMUSCULAR | Status: DC

## 2022-11-13 MED ORDER — METHOCARBAMOL 500 MG PO TABS
500.0000 mg | ORAL_TABLET | Freq: Three times a day (TID) | ORAL | Status: DC
  Administered 2022-11-13 – 2022-11-14 (×3): 500 mg via ORAL
  Filled 2022-11-13 (×3): qty 1

## 2022-11-13 MED ORDER — DOCUSATE SODIUM 50 MG/5ML PO LIQD
50.0000 mg | Freq: Two times a day (BID) | ORAL | Status: DC | PRN
  Administered 2022-11-14: 50 mg via ORAL
  Filled 2022-11-13: qty 10

## 2022-11-13 MED ORDER — HYDROXYZINE HCL 10 MG/5ML PO SYRP: 10.0000 mg | ORAL_SOLUTION | Freq: Once | ORAL | Status: DC | PRN

## 2022-11-13 MED ORDER — FENTANYL CITRATE PF 50 MCG/ML IJ SOSY
25.0000 ug | PREFILLED_SYRINGE | Freq: Once | INTRAMUSCULAR | Status: AC | PRN
  Administered 2022-11-13: 25 ug via INTRAVENOUS
  Filled 2022-11-13: qty 1

## 2022-11-13 MED ORDER — INSULIN ASPART 100 UNIT/ML IJ SOLN
3.0000 [IU] | Freq: Three times a day (TID) | INTRAMUSCULAR | Status: DC
  Administered 2022-11-13 – 2022-11-14 (×3): 3 [IU] via SUBCUTANEOUS

## 2022-11-13 NOTE — TOC Progression Note (Addendum)
Transition of Care [facility]) - Progression Note    Patient Details  Name: Heather [name] MRN: [id number] Date of Birth: 1951-10-03  Transition of Care [facility]) CM/SW Contact  Boneta Lucks, RN Phone Number: 11/13/2022, 11:26 AM  Clinical Narrative:   INS Auth still pending. [facility] does not have a bed today. CM spoke with her spouse, Jeneen Rinks to update him with possibly discharge tomorrow.  Addendum : [facility] has a bed, MD update. Per MD patient need more fluids, okay to DC tomorrow, Auth still pending.   Addendum #2  -She's approved 1/16 - 1/18, next review 1/18, next review 1/18, Candace Cruise id [id number], plan auth id [id number]   Expected Discharge Plan: [place] Barriers to Discharge: Continued Medical Work up  Expected Discharge Plan and Services       Living arrangements for the past 2 months: Mobile Home                  Social Determinants of Health ([place]) Interventions [place]: No Food Insecurity (11/12/2022)  Housing: Low Risk  (11/12/2022)  Transportation Needs: No Transportation Needs (11/12/2022)  Utilities: Not At Risk (11/12/2022)  Tobacco Use: Low Risk  (11/11/2022)    Readmission Risk Interventions    11/12/2022    4:06 PM 10/18/2022   12:42 PM 07/05/2022   11:42 AM  Readmission Risk Prevention Plan  Transportation Screening Complete Complete Complete  Medication Review Press photographer) Complete Complete Complete  PCP or Specialist appointment within 3-5 days of discharge Not Complete    HRI or Home Care Consult Complete Complete Complete  SW Recovery Care/Counseling Consult Complete Complete Complete  Palliative Care Screening Not Applicable Not Applicable Not Applicable  Skilled Nursing Facility Complete Complete Not Applicable

## 2022-11-13 NOTE — Progress Notes (Signed)
Subjective:  BUN and crt a little better-  at least 500 of UOP-  right now is saying she feels like she has to void but can't-  Purewick system in place   Objective Vital signs in last 24 hours: Vitals:   11/13/22 0300 11/13/22 0400 11/13/22 0507 11/13/22 0600  BP: (!) 146/34  (!) 194/55 (!) 175/45  Pulse: 96 95 (!) 101 98  Resp: '14 16 16 11  '$ Temp:  98.1 F (36.7 C)    TempSrc:  Oral    SpO2: 97% 98% 100% 98%  Weight:      Height:       Weight change: -7.667 kg  Intake/Output Summary (Last 24 hours) at 11/13/2022 0946 Last data filed at 11/13/2022 0400 Gross per 24 hour  Intake 3838.35 ml  Output 500 ml  Net 3338.35 ml    Assessment/Plan: 72 year old white female with multiple medical issues including advanced CKD, and failure to thrive.  She presents with acute on chronic renal failure, hyponatremia, hyperkalemia and acidosis in the setting of hyperglycemia Renal-baseline stage IV CKD with creatinine in the mid twos.  Patient has had many episodes in the past of acute on chronic renal failure.  She presents with another in the setting of hyperglycemia.  With treatment so far which includes insulin and fluids kidney function does appear to be improved.  No absolute indications for dialysis Hyperkalemia-was in the setting of hyperglycemia.  Has improved with treatment of sugar as well as LR Hyponatremia-pseudohyponatremia in the setting of hyperglycemia.  Has improved with insulin.  This appears to be volume depleted hyponatremia 4. Hypertension/volume  -appears to be dry in the setting of hyperglycemia.  However, blood pressure is high?  Would continue with lactated Ringer's IVF-  no edema-  cont IVF for now 5. Anemia  -related to CKD.  Hemoglobin was above 10 but would suspect will decrease in the setting of hydration-  is now in the 8's.  Supportive care for now-  will also check iron and give one dose ESA      Louis Meckel    Labs: Basic Metabolic Panel: Recent Labs   Lab 11/12/22 0838 11/12/22 1855 11/13/22 0325  NA 126* 126* 128*  K 4.3 4.5 3.7  CL 95* 96* 99  CO2 14* 17* 18*  GLUCOSE 178* 217* 179*  BUN 126* 123* 116*  CREATININE 2.99* 2.81* 2.56*  CALCIUM 10.0 9.6 9.1  PHOS 4.0  --  2.8   Liver Function Tests: Recent Labs  Lab 11/11/22 1818 11/12/22 0838 11/13/22 0325  AST 18 22  --   ALT 14 14  --   ALKPHOS 172* 171*  --   BILITOT 0.7 0.5  --   PROT 7.8 7.7  --   ALBUMIN 3.5 3.6 2.6*   No results for input(s): "LIPASE", "AMYLASE" in the last 168 hours. No results for input(s): "AMMONIA" in the last 168 hours. CBC: Recent Labs  Lab 11/11/22 1818 11/12/22 0838 11/13/22 0325  WBC 10.1 14.5* 10.7*  NEUTROABS 8.1*  --   --   HGB 10.4* 10.9* 8.3*  HCT 31.4* 32.5* 24.3*  MCV 88.7 88.1 87.4  PLT 443* 339 266   Cardiac Enzymes: No results for input(s): "CKTOTAL", "CKMB", "CKMBINDEX", "TROPONINI" in the last 168 hours. CBG: Recent Labs  Lab 11/13/22 0105 11/13/22 0333 11/13/22 0431 11/13/22 0607 11/13/22 0844  GLUCAP 195* 171* 170* 178* 147*    Iron Studies: No results for input(s): "IRON", "TIBC", "TRANSFERRIN", "FERRITIN" in the  last 72 hours. Studies/Results: CT Head Wo Contrast  Result Date: 11/11/2022 CLINICAL DATA:  Head trauma. Blunt. Fall yesterday with bruising to left side of head. EXAM: CT HEAD WITHOUT CONTRAST TECHNIQUE: Contiguous axial images were obtained from the base of the skull through the vertex without intravenous contrast. RADIATION DOSE REDUCTION: This exam was performed according to the departmental dose-optimization program which includes automated exposure control, adjustment of the mA and/or kV according to patient size and/or use of iterative reconstruction technique. COMPARISON:  CT head without contrast 10/15/2022 FINDINGS: Brain: Asymmetric hyperdensity within the left lentiform nucleus is similar the prior exam. No significant right-sided density is present. No acute hemorrhage contusion is  evident. No acute infarct is present. Moderate generalized atrophy and white matter disease is similar the prior study. The ventricles are proportionate to the degree of atrophy. No significant extraaxial fluid collection is present. The brainstem and cerebellum are within normal limits. Midline structures are within normal limits. Vascular: Dense atherosclerotic calcifications are present within the cavernous internal carotid arteries and at the dural margin of both vertebral arteries. No hyperdense vessel is present. Skull: Calvarium is intact. No focal lytic or blastic lesions are present. No significant extracranial soft tissue lesion is present. Sinuses/Orbits: The paranasal sinuses and mastoid air cells are clear. Bilateral lens replacements are noted. Globes and orbits are otherwise unremarkable. IMPRESSION: 1. No acute intracranial abnormality or significant interval change. 2. Stable moderate generalized atrophy and white matter disease. This likely reflects the sequela of chronic microvascular ischemia. 3. Persistent asymmetric density within the left lentiform nucleus. This has been described in the setting of nonketotic hyperglycemic hemichorea. This would be consistent with the patient's history of hyperglycemia. The finding typically resolves over time. Electronically Signed   By: San Morelle M.D.   On: 11/11/2022 18:23   CT Cervical Spine Wo Contrast  Result Date: 11/11/2022 CLINICAL DATA:  Fall EXAM: CT CERVICAL SPINE WITHOUT CONTRAST TECHNIQUE: Multidetector CT imaging of the cervical spine was performed without intravenous contrast. Multiplanar CT image reconstructions were also generated. RADIATION DOSE REDUCTION: This exam was performed according to the departmental dose-optimization program which includes automated exposure control, adjustment of the mA and/or kV according to patient size and/or use of iterative reconstruction technique. COMPARISON:  10/15/2022 FINDINGS: Alignment:  No subluxation Skull base and vertebrae: No acute fracture. No primary bone lesion or focal pathologic process. Soft tissues and spinal canal: No prevertebral fluid or swelling. No visible canal hematoma. Disc levels: Prior fusion throughout the cervical spine from C3 to T1. Anterior plate remain in place at C3-4 and C7-T1. Posterior fusion at C7-T1. Upper chest: No acute findings Other: None IMPRESSION: Postoperative and degenerative changes.  No acute bony abnormality. Electronically Signed   By: Rolm Baptise M.D.   On: 11/11/2022 18:15   DG Humerus Right  Result Date: 11/11/2022 CLINICAL DATA:  Trauma EXAM: RIGHT HUMERUS - 2+ VIEW COMPARISON:  10/15/2022 FINDINGS: Right humeral neck fracture again noted. There is difference in alignment of the humeral head and humeral shaft when compared to prior study suggesting re-injury or new fracture superimposed on subacute to chronic fracture. No subluxation or dislocation. IMPRESSION: Right humeral neck fracture, likely new fracture superimposed on subacute to chronic fracture. Electronically Signed   By: Rolm Baptise M.D.   On: 11/11/2022 18:04   Medications: Infusions:  sodium chloride Stopped (11/12/22 0657)   lactated ringers Stopped (11/12/22 1212)    Scheduled Medications:  atorvastatin  80 mg Oral Daily   Chlorhexidine Gluconate  Cloth  6 each Topical Daily   heparin  5,000 Units Subcutaneous Q8H   hydrALAZINE  50 mg Oral Q8H   insulin aspart  0-5 Units Subcutaneous QHS   insulin aspart  0-9 Units Subcutaneous TID WC   insulin aspart  12 Units Subcutaneous Once   insulin aspart  3 Units Subcutaneous TID WC   insulin detemir  10 Units Subcutaneous Daily   lipase/protease/amylase  36,000 Units Oral TID WC   metoprolol succinate  25 mg Oral Daily   mupirocin ointment  1 Application Nasal BID   pantoprazole  40 mg Oral Daily    have reviewed scheduled and prn medications.  Physical Exam: General:  alert, nad Heart:  tachy Lungs:  clear Abdomen: soft, non tender Extremities: no edema-  right sided hip pain     11/13/2022,9:46 AM  LOS: 1 day

## 2022-11-13 NOTE — Care Management Important Message (Signed)
Important Message  Patient Details  Name: Heather [name] MRN: [id number] Date of Birth: Dec 15, 1950   Medicare Important Message Given:  Yes     Tommy Medal 11/13/2022, 11:47 AM

## 2022-11-13 NOTE — Progress Notes (Addendum)
PROGRESS NOTE     Heather [name], is a 72 y.o. female, DOB - [birth year], ATF:[id number]  Admit date - 11/11/2022   Admitting Physician [name], MD  Outpatient Primary MD for the patient is [name], MD  LOS - 1  Chief Complaint  Patient presents with   Fall        Brief Narrative:   72 y.o. female with medical history significant of hypertension, uncontrolled type 2 diabetes mellitus with hyperglycemia, CKD stage IV, admitted on 11/11/2022 with severe hyperglycemia with HHS/DKA and AKI on CKD  11/13/22 --Nephrologist Dr. [name] recommends additional IV fluids through 11/14/2022 prior to discharge to SNF rehab  -Awaiting insurance authorization to go to SNF rehab   -Assessment and Plan: 1)HHS/DKA -Treated with IV insulin per Endo tool protocol -Glucose down to less than 200 from 895 on admission -Transition off IV insulin to subcu insulin  2)Pseudohyponatremia Na 115 on admission,Glucose was 895 -Improving with hydration  3)Hyperkalemia in the setting of hyperglycemia/DKA--  K + was 5.7 -Hyperkalemia resolved with IV insulin and hydration   - 4)Right humeral neck fracture, likely new fracture superimposed on subacute to chronic fracture- -continue sling use- physical therapy eval appreciated-outpatient follow-up with orthopedic surgeon advisedAcute on chronic right femoral fracture Recurrent falls Continue Tylenol as needed Continue fall precaution Continue PT/OT eval and treat  5)CKD stage V -Renal function improving slowly but not back to baseline Renally adjust medications, avoid nephrotoxic agents/dehydration/hypotension -Nephrology consult appreciated, they recommend continuing IV fluids for now (-Nephrologist Dr. [name] recommends additional IV fluids through 11/14/2022 prior to discharge to SNF rehab)   6)Chronic pancreatitis Continue Creon   7)HTN-Continue hydralazine and metoprolol   8)GERD Continue Protonix  9)Recurrent  falls---PTA pt lived alone and did very poorly, patient has significant limitations with mobility related ADLs- this patient needs to continue to be monitored in the hospital until a SNF bed is obtained as she is not safe to go home with her current physcical limitations -Right upper extremity fracture.... Difficulties with ADLs -Physical therapy eval appreciated recommends SNF rehab  10) chronic anemia of CKD--- watch H&H closely -Anticipate further drop in Hgb due to hemodilution from IV fluids -No active bleeding noted -Anemia labs including ferritin, serum iron B12 and folate requested  Status is: Inpatient   Disposition: The patient is from: Home              Anticipated d/c is to: SNF               Anticipated d/c date is: 1 day              Patient currently is not medically stable to d/c. Barriers: -Awaiting insurance authorization to go to SNF rehab/-Nephrologist Dr. [name] recommends additional IV fluids through 11/14/2022 prior to discharge to SNF rehab  Code Status :  -  Code Status: Full Code   Family Communication:    NA (patient is alert, awake and coherent)   DVT Prophylaxis  :   - SCDs  heparin injection 5,000 Units Start: 11/12/22 0145 SCDs Start: 11/12/22 0137   Lab Results  Component Value Date   PLT 266 11/13/2022    Inpatient Medications  Scheduled Meds:  atorvastatin  80 mg Oral Daily   Chlorhexidine Gluconate Cloth  6 each Topical Daily   darbepoetin (ARANESP) injection - NON-DIALYSIS  200 mcg Subcutaneous Q Tue-1800   heparin  5,000 Units Subcutaneous Q8H   hydrALAZINE  50 mg Oral Q8H   insulin aspart  0-5 Units Subcutaneous QHS   insulin aspart  0-9 Units Subcutaneous TID WC   insulin aspart  12 Units Subcutaneous Once   insulin aspart  3 Units Subcutaneous TID WC   insulin detemir  10 Units Subcutaneous Daily   levETIRAcetam  500 mg Oral BID   lipase/protease/amylase  36,000 Units Oral TID WC   metoprolol succinate  25 mg Oral Daily    mupirocin ointment  1 Application Nasal BID   pantoprazole  40 mg Oral Daily   Continuous Infusions:  sodium chloride Stopped (11/12/22 0657)   lactated ringers Stopped (11/12/22 1212)   PRN Meds:.acetaminophen **OR** acetaminophen, dextrose, ondansetron **OR** ondansetron (ZOFRAN) IV   Anti-infectives (From admission, onward)    None       Subjective: Heather [name] today has no fevers, no emesis,  No chest pain,   - Voiding okay, C/o right shoulder area discomfort C/o back pain , not new----no urinary sxs --Nephrologist Dr. [name] recommends additional IV fluids through 11/14/2022 prior to discharge to SNF rehab  Objective: Vitals:   11/13/22 0300 11/13/22 0400 11/13/22 0507 11/13/22 0600  BP: (!) 146/34  (!) 194/55 (!) 175/45  Pulse: 96 95 (!) 101 98  Resp: '14 16 16 11  '$ Temp:  98.1 F (36.7 C)    TempSrc:  Oral    SpO2: 97% 98% 100% 98%  Weight:      Height:        Intake/Output Summary (Last 24 hours) at 11/13/2022 1030 Last data filed at 11/13/2022 0400 Gross per 24 hour  Intake 3838.35 ml  Output 500 ml  Net 3338.35 ml   Filed Weights   11/11/22 1633 11/12/22 1756  Weight: 59 kg 51.3 kg   Physical Exam  Gen:- Awake Alert,  in no apparent distress  HEENT:- [place].AT, No sclera icterus Neck-Supple Neck,No JVD,.  Lungs-  CTAB , fair symmetrical air movement CV- S1, S2 normal, regular  Abd-  +ve B.Sounds, Abd Soft, No tenderness,  No CVA tenderness  Extremity/Skin:- edema, pedal pulses present  Psych-affect is appropriate, oriented x3 Neuro-no new focal deficits, no tremors MSK-right upper extremity in sling  Data Reviewed: I have personally reviewed following labs and imaging studies  CBC: Recent Labs  Lab 11/11/22 1818 11/12/22 0838 11/13/22 0325  WBC 10.1 14.5* 10.7*  NEUTROABS 8.1*  --   --   HGB 10.4* 10.9* 8.3*  HCT 31.4* 32.5* 24.3*  MCV 88.7 88.1 87.4  PLT 443* 339 644   Basic Metabolic Panel: Recent Labs  Lab 11/11/22 1818  11/11/22 2012 11/12/22 0838 11/12/22 1855 11/13/22 0325  NA 116* 115* 126* 126* 128*  K 5.7* 5.9* 4.3 4.5 3.7  CL 82* 85* 95* 96* 99  CO2 19* 14* 14* 17* 18*  GLUCOSE 893* 895* 178* 217* 179*  BUN 133* 132* 126* 123* 116*  CREATININE 3.36* 3.47* 2.99* 2.81* 2.56*  CALCIUM 10.0 9.6 10.0 9.6 9.1  MG  --   --  2.9*  --   --   PHOS  --   --  4.0  --  2.8   GFR: Estimated Creatinine Clearance: 16.6 mL/min (A) (by C-G formula based on SCr of 2.56 mg/dL (H)). Liver Function Tests: Recent Labs  Lab 11/11/22 1818 11/12/22 0838 11/13/22 0325  AST 18 22  --   ALT 14 14  --   ALKPHOS 172* 171*  --   BILITOT 0.7 0.5  --   PROT 7.8 7.7  --   ALBUMIN 3.5 3.6 2.6*  Radiology Studies: CT Head Wo Contrast  Result Date: 11/11/2022 CLINICAL DATA:  Head trauma. Blunt. Fall yesterday with bruising to left side of head. EXAM: CT HEAD WITHOUT CONTRAST TECHNIQUE: Contiguous axial images were obtained from the base of the skull through the vertex without intravenous contrast. RADIATION DOSE REDUCTION: This exam was performed according to the departmental dose-optimization program which includes automated exposure control, adjustment of the mA and/or kV according to patient size and/or use of iterative reconstruction technique. COMPARISON:  CT head without contrast 10/15/2022 FINDINGS: Brain: Asymmetric hyperdensity within the left lentiform nucleus is similar the prior exam. No significant right-sided density is present. No acute hemorrhage contusion is evident. No acute infarct is present. Moderate generalized atrophy and white matter disease is similar the prior study. The ventricles are proportionate to the degree of atrophy. No significant extraaxial fluid collection is present. The brainstem and cerebellum are within normal limits. Midline structures are within normal limits. Vascular: Dense atherosclerotic calcifications are present within the cavernous internal carotid arteries and at the dural  margin of both vertebral arteries. No hyperdense vessel is present. Skull: Calvarium is intact. No focal lytic or blastic lesions are present. No significant extracranial soft tissue lesion is present. Sinuses/Orbits: The paranasal sinuses and mastoid air cells are clear. Bilateral lens replacements are noted. Globes and orbits are otherwise unremarkable. IMPRESSION: 1. No acute intracranial abnormality or significant interval change. 2. Stable moderate generalized atrophy and white matter disease. This likely reflects the sequela of chronic microvascular ischemia. 3. Persistent asymmetric density within the left lentiform nucleus. This has been described in the setting of nonketotic hyperglycemic hemichorea. This would be consistent with the patient's history of hyperglycemia. The finding typically resolves over time. Electronically Signed   By: San Morelle M.D.   On: 11/11/2022 18:23   CT Cervical Spine Wo Contrast  Result Date: 11/11/2022 CLINICAL DATA:  Fall EXAM: CT CERVICAL SPINE WITHOUT CONTRAST TECHNIQUE: Multidetector CT imaging of the cervical spine was performed without intravenous contrast. Multiplanar CT image reconstructions were also generated. RADIATION DOSE REDUCTION: This exam was performed according to the departmental dose-optimization program which includes automated exposure control, adjustment of the mA and/or kV according to patient size and/or use of iterative reconstruction technique. COMPARISON:  10/15/2022 FINDINGS: Alignment: No subluxation Skull base and vertebrae: No acute fracture. No primary bone lesion or focal pathologic process. Soft tissues and spinal canal: No prevertebral fluid or swelling. No visible canal hematoma. Disc levels: Prior fusion throughout the cervical spine from C3 to T1. Anterior plate remain in place at C3-4 and C7-T1. Posterior fusion at C7-T1. Upper chest: No acute findings Other: None IMPRESSION: Postoperative and degenerative changes.  No acute  bony abnormality. Electronically Signed   By: Rolm Baptise M.D.   On: 11/11/2022 18:15   DG Humerus Right  Result Date: 11/11/2022 CLINICAL DATA:  Trauma EXAM: RIGHT HUMERUS - 2+ VIEW COMPARISON:  10/15/2022 FINDINGS: Right humeral neck fracture again noted. There is difference in alignment of the humeral head and humeral shaft when compared to prior study suggesting re-injury or new fracture superimposed on subacute to chronic fracture. No subluxation or dislocation. IMPRESSION: Right humeral neck fracture, likely new fracture superimposed on subacute to chronic fracture. Electronically Signed   By: Rolm Baptise M.D.   On: 11/11/2022 18:04     Scheduled Meds:  atorvastatin  80 mg Oral Daily   Chlorhexidine Gluconate Cloth  6 each Topical Daily   darbepoetin (ARANESP) injection - NON-DIALYSIS  200 mcg Subcutaneous Q  Tue-1800   heparin  5,000 Units Subcutaneous Q8H   hydrALAZINE  50 mg Oral Q8H   insulin aspart  0-5 Units Subcutaneous QHS   insulin aspart  0-9 Units Subcutaneous TID WC   insulin aspart  12 Units Subcutaneous Once   insulin aspart  3 Units Subcutaneous TID WC   insulin detemir  10 Units Subcutaneous Daily   levETIRAcetam  500 mg Oral BID   lipase/protease/amylase  36,000 Units Oral TID WC   metoprolol succinate  25 mg Oral Daily   mupirocin ointment  1 Application Nasal BID   pantoprazole  40 mg Oral Daily   Continuous Infusions:  sodium chloride Stopped (11/12/22 0657)   lactated ringers Stopped (11/12/22 1212)     LOS: 1 day    Roxan Hockey M.D on 11/13/2022 at 10:30 AM  Go to [email or web address] - for contact info  [facility] - Office  [phone]  If 7PM-7AM, please contact night-coverage [email or web address] 11/13/2022, 10:30 AM

## 2022-11-14 DIAGNOSIS — N189 Chronic kidney disease, unspecified: Secondary | ICD-10-CM | POA: Diagnosis not present

## 2022-11-14 DIAGNOSIS — E1165 Type 2 diabetes mellitus with hyperglycemia: Secondary | ICD-10-CM | POA: Diagnosis not present

## 2022-11-14 DIAGNOSIS — N200 Calculus of kidney: Secondary | ICD-10-CM | POA: Diagnosis not present

## 2022-11-14 DIAGNOSIS — E875 Hyperkalemia: Secondary | ICD-10-CM | POA: Diagnosis not present

## 2022-11-14 DIAGNOSIS — E871 Hypo-osmolality and hyponatremia: Secondary | ICD-10-CM | POA: Diagnosis not present

## 2022-11-14 DIAGNOSIS — N183 Chronic kidney disease, stage 3 unspecified: Secondary | ICD-10-CM | POA: Diagnosis not present

## 2022-11-14 DIAGNOSIS — R29898 Other symptoms and signs involving the musculoskeletal system: Secondary | ICD-10-CM | POA: Diagnosis not present

## 2022-11-14 DIAGNOSIS — E1110 Type 2 diabetes mellitus with ketoacidosis without coma: Secondary | ICD-10-CM | POA: Diagnosis not present

## 2022-11-14 DIAGNOSIS — N3946 Mixed incontinence: Secondary | ICD-10-CM | POA: Diagnosis not present

## 2022-11-14 DIAGNOSIS — E11622 Type 2 diabetes mellitus with other skin ulcer: Secondary | ICD-10-CM | POA: Diagnosis not present

## 2022-11-14 DIAGNOSIS — S42211D Unspecified displaced fracture of surgical neck of right humerus, subsequent encounter for fracture with routine healing: Secondary | ICD-10-CM | POA: Diagnosis not present

## 2022-11-14 DIAGNOSIS — I959 Hypotension, unspecified: Secondary | ICD-10-CM | POA: Diagnosis not present

## 2022-11-14 DIAGNOSIS — E119 Type 2 diabetes mellitus without complications: Secondary | ICD-10-CM | POA: Diagnosis not present

## 2022-11-14 DIAGNOSIS — N184 Chronic kidney disease, stage 4 (severe): Secondary | ICD-10-CM | POA: Diagnosis not present

## 2022-11-14 DIAGNOSIS — I130 Hypertensive heart and chronic kidney disease with heart failure and stage 1 through stage 4 chronic kidney disease, or unspecified chronic kidney disease: Secondary | ICD-10-CM | POA: Diagnosis not present

## 2022-11-14 DIAGNOSIS — D649 Anemia, unspecified: Secondary | ICD-10-CM | POA: Diagnosis not present

## 2022-11-14 DIAGNOSIS — M6281 Muscle weakness (generalized): Secondary | ICD-10-CM | POA: Diagnosis not present

## 2022-11-14 DIAGNOSIS — R7989 Other specified abnormal findings of blood chemistry: Secondary | ICD-10-CM | POA: Diagnosis not present

## 2022-11-14 DIAGNOSIS — Z7401 Bed confinement status: Secondary | ICD-10-CM | POA: Diagnosis not present

## 2022-11-14 DIAGNOSIS — R293 Abnormal posture: Secondary | ICD-10-CM | POA: Diagnosis not present

## 2022-11-14 DIAGNOSIS — I5032 Chronic diastolic (congestive) heart failure: Secondary | ICD-10-CM | POA: Diagnosis not present

## 2022-11-14 DIAGNOSIS — K5901 Slow transit constipation: Secondary | ICD-10-CM | POA: Diagnosis not present

## 2022-11-14 DIAGNOSIS — R296 Repeated falls: Secondary | ICD-10-CM | POA: Diagnosis not present

## 2022-11-14 DIAGNOSIS — L89313 Pressure ulcer of right buttock, stage 3: Secondary | ICD-10-CM | POA: Diagnosis not present

## 2022-11-14 DIAGNOSIS — E1101 Type 2 diabetes mellitus with hyperosmolarity with coma: Secondary | ICD-10-CM

## 2022-11-14 DIAGNOSIS — I10 Essential (primary) hypertension: Secondary | ICD-10-CM | POA: Diagnosis not present

## 2022-11-14 DIAGNOSIS — R269 Unspecified abnormalities of gait and mobility: Secondary | ICD-10-CM | POA: Diagnosis not present

## 2022-11-14 DIAGNOSIS — N179 Acute kidney failure, unspecified: Secondary | ICD-10-CM | POA: Diagnosis not present

## 2022-11-14 DIAGNOSIS — D75839 Thrombocytosis, unspecified: Secondary | ICD-10-CM | POA: Diagnosis not present

## 2022-11-14 DIAGNOSIS — M109 Gout, unspecified: Secondary | ICD-10-CM | POA: Diagnosis not present

## 2022-11-14 DIAGNOSIS — R239 Unspecified skin changes: Secondary | ICD-10-CM | POA: Diagnosis not present

## 2022-11-14 DIAGNOSIS — S72001D Fracture of unspecified part of neck of right femur, subsequent encounter for closed fracture with routine healing: Secondary | ICD-10-CM | POA: Diagnosis not present

## 2022-11-14 DIAGNOSIS — E785 Hyperlipidemia, unspecified: Secondary | ICD-10-CM | POA: Diagnosis not present

## 2022-11-14 DIAGNOSIS — R54 Age-related physical debility: Secondary | ICD-10-CM | POA: Diagnosis not present

## 2022-11-14 DIAGNOSIS — K859 Acute pancreatitis without necrosis or infection, unspecified: Secondary | ICD-10-CM | POA: Diagnosis not present

## 2022-11-14 DIAGNOSIS — K219 Gastro-esophageal reflux disease without esophagitis: Secondary | ICD-10-CM | POA: Diagnosis not present

## 2022-11-14 DIAGNOSIS — R5381 Other malaise: Secondary | ICD-10-CM | POA: Diagnosis not present

## 2022-11-14 LAB — VITAMIN B12: Vitamin B-12: 837 pg/mL (ref 180–914)

## 2022-11-14 LAB — FERRITIN: Ferritin: 1832 ng/mL — ABNORMAL HIGH (ref 11–307)

## 2022-11-14 LAB — RENAL FUNCTION PANEL
Albumin: 2.6 g/dL — ABNORMAL LOW (ref 3.5–5.0)
Anion gap: 9 (ref 5–15)
BUN: 97 mg/dL — ABNORMAL HIGH (ref 8–23)
CO2: 19 mmol/L — ABNORMAL LOW (ref 22–32)
Calcium: 8.7 mg/dL — ABNORMAL LOW (ref 8.9–10.3)
Chloride: 101 mmol/L (ref 98–111)
Creatinine, Ser: 2.32 mg/dL — ABNORMAL HIGH (ref 0.44–1.00)
GFR, Estimated: 22 mL/min — ABNORMAL LOW (ref >60)
Glucose, Bld: 300 mg/dL — ABNORMAL HIGH (ref 70–99)
Phosphorus: 3.4 mg/dL (ref 2.5–4.6)
Potassium: 4.5 mmol/L (ref 3.5–5.1)
Sodium: 129 mmol/L — ABNORMAL LOW (ref 135–145)

## 2022-11-14 LAB — URINE CULTURE: Culture: NO GROWTH

## 2022-11-14 LAB — GLUCOSE, CAPILLARY
Glucose-Capillary: 330 mg/dL — ABNORMAL HIGH (ref 70–99)
Glucose-Capillary: 265 mg/dL — ABNORMAL HIGH (ref 70–99)

## 2022-11-14 LAB — IRON AND TIBC
Iron: 153 ug/dL (ref 28–170)
Saturation Ratios: 89 % — ABNORMAL HIGH (ref 10.4–31.8)
TIBC: 172 ug/dL — ABNORMAL LOW (ref 250–450)
UIBC: 19 ug/dL

## 2022-11-14 LAB — FOLATE: Folate: 18.3 ng/mL (ref >5.9)

## 2022-11-14 MED ORDER — LEVEMIR FLEXPEN 100 UNIT/ML ~~LOC~~ SOPN: 15.0000 [IU] | PEN_INJECTOR | Freq: Every day | SUBCUTANEOUS | 2 refills | Status: DC

## 2022-11-14 MED ORDER — ACETAMINOPHEN 325 MG PO TABS: 650.0000 mg | ORAL_TABLET | ORAL | Status: DC | PRN

## 2022-11-14 MED ORDER — INSULIN ASPART 100 UNIT/ML IJ SOLN: 5.0000 [IU] | Freq: Three times a day (TID) | INTRAMUSCULAR | Status: DC

## 2022-11-14 MED ORDER — METOPROLOL SUCCINATE ER 25 MG PO TB24: 37.5000 mg | ORAL_TABLET | Freq: Every day | ORAL | 0 refills | Status: DC

## 2022-11-14 MED ORDER — POLYETHYLENE GLYCOL 3350 17 G PO PACK: 17.0000 g | PACK | Freq: Every day | ORAL | 0 refills | Status: DC

## 2022-11-14 MED ORDER — FLEET ENEMA 7-19 GM/118ML RE ENEM
1.0000 | ENEMA | Freq: Once | RECTAL | Status: AC
  Administered 2022-11-14: 1 via RECTAL

## 2022-11-14 MED ORDER — POLYETHYLENE GLYCOL 3350 17 G PO PACK
17.0000 g | PACK | Freq: Every day | ORAL | Status: DC
  Administered 2022-11-14: 17 g via ORAL
  Filled 2022-11-14: qty 1

## 2022-11-14 MED ORDER — METOPROLOL SUCCINATE ER 50 MG PO TB24: 50.0000 mg | ORAL_TABLET | Freq: Every day | ORAL | Status: DC

## 2022-11-14 MED ORDER — METHOCARBAMOL 500 MG PO TABS: 500.0000 mg | ORAL_TABLET | Freq: Three times a day (TID) | ORAL | Status: DC | PRN

## 2022-11-14 MED ORDER — LINAGLIPTIN 5 MG PO TABS: 5.0000 mg | ORAL_TABLET | Freq: Every day | ORAL | Status: DC

## 2022-11-14 MED ORDER — INSULIN ASPART 100 UNIT/ML FLEXPEN: 5.0000 [IU] | PEN_INJECTOR | Freq: Three times a day (TID) | SUBCUTANEOUS | 3 refills | Status: DC

## 2022-11-14 MED ORDER — METOPROLOL SUCCINATE ER 25 MG PO TB24
37.5000 mg | ORAL_TABLET | Freq: Every day | ORAL | Status: DC
  Administered 2022-11-14: 37.5 mg via ORAL
  Filled 2022-11-14: qty 2

## 2022-11-14 NOTE — Care Management Important Message (Signed)
Important Message  Patient Details  Name: Heather [name] MRN: [id number] Date of Birth: 02/24/1951   Medicare Important Message Given:  Yes     Tommy Medal 11/14/2022, 12:24 PM

## 2022-11-14 NOTE — Progress Notes (Signed)
Nurse to nurse report called to Linton Rump, nurse at [facility]. All questions answered and medication education provided. IV's removed by NT with no complications. Patient alert and oriented x4. Linton Rump made aware of any skin assessments and fall risk. Patient tolerated PO meds and diet well. Appetite not great, she ate less than 50% for breakfast and lunch. Patient discharged with all belongings including phone and phone charger in belongings bag. Discharge summary placed in discharge folder, no noted written prescriptions at time of discharge. Patient discharged with all belongings for [facility] via EMS. Patient aware of plan.

## 2022-11-14 NOTE — Discharge Instructions (Signed)
PLEASE CHECK BLOOD SUGARS AT LEAST 4 TIMES DAILY   AVOID CONCENTRATED SWEETS OR FRUIT JUICES EXCEPT TO TREAT A LOW BLOOD SUGAR

## 2022-11-14 NOTE — Discharge Summary (Addendum)
Physician Discharge Summary  Heather [name] FFM:[id number] DOB: 25-Jun-1951 DOA: 11/11/2022  PCP: [name], MD  Admit date: 11/11/2022 Discharge date: 11/14/2022  Admitted From:  Home  Disposition: SNF   Recommendations for Outpatient Follow-up:  Please monitor blood sugar 4 times per day Titrate insulin doses as needed for better blood sugar control Please obtain BMP and CBC in 1 week to follow up renal function and hemoglobin Recommend wound care consult and wound care protocol for stage 2 sacral skin breakdown Recommend air mattress    Discharge Condition: STABLE   CODE STATUS: FULL DIET: carb modified, low sodium, PLEASE GIVE CREON WITH EACH MEAL for digestion.    Brief Hospitalization Summary: Please see all hospital notes, images, labs for full details of the hospitalization. ADMISSION HPI:  72 y.o. female with medical history significant of hypertension, uncontrolled type 2 diabetes mellitus with hyperglycemia, CKD stage IV, anxiety who presents to the emergency department due to frequent falls, nausea and generalized weakness.  Patient states that she fell 2 days ago and yesterday and struck left side of face, she has had a right femoral fracture due to sustained falls.  She complains of recurrent pain in the right which was due to acute on chronic fracture of the proximal humerus.  She complained of blood glucose levels that continue to increase and endorsed dry mouth.  She denies fever, chills, chest pain, shortness of breath.     ED Course:  In the emergency department, she was intermittently tachypneic, BP was 189/84, other vital signs are within normal range.  Workup in the ED showed normocytic anemia, thrombocytosis.  BMP showed sodium 116, potassium 5.7, chloride 82, bicarb 19, BUN 133, creatinine 3.36, EGFR 14, blood glucose 893. CT head without contrast showed no acute intracranial abnormality or significant interval change CT cervical spine without contrast showed  no acute bony abnormality.  Right humerus showed right femoral neck fracture, likely new fracture superimposed on subacute or chronic fracture.  Patient was started on insulin drip per Endo tool, IV hydration was started.  Hospitalist was asked admit patient for further evaluation and management.  HOSPITAL COURSE BY PROBLEM    HHS -Treated with IV insulin per Endo tool protocol -Glucose down to less than 200 from 895 on admission -Transitioned off IV insulin to subcutaneous insulin -Levemir 15 units daily plus novolog 5 units TID with meals, tradjenta 5 mg daily   Pseudohyponatremia Na 115 on admission,Glucose was 895 -Improved with hydration and glycemic control   Hyperkalemia in the setting of hyperglycemia/DKA--  K + was 5.7 -TREATED AND RESOLVED  Right humeral neck fracture, likely new fracture superimposed on subacute to chronic fracture -continue sling use- physical therapy eval appreciated-outpatient follow-up with orthopedic surgeon advised with [name] Acute on chronic right femoral fracture Recurrent falls Continue Tylenol as needed Continue fall precaution Continue PT/OT eval and treat   CKD stage V -Renal function improving slowly but not back to baseline Renally adjust medications, avoid nephrotoxic agents/dehydration/hypotension -Nephrology consult appreciated, she responded well to IV fluid and renal function back to baseline   Chronic pancreatitis Continue Creon with every meal to assist with digestion    HTN-Continue hydralazine and metoprolol, increased dose of metoprolol to 37.5 mg daily    GERD Continue Protonix for GI protection    Recurrent falls---PTA pt lived alone and did very poorly, patient has significant limitations with mobility related ADLs- this patient needs to continue to be monitored in the hospital until a SNF bed is  obtained as she is not safe to go home with her current physical limitations -Right upper extremity fracture....  Difficulties with ADLs -Physical therapy eval appreciated recommends SNF rehab   chronic anemia of CKD--- watch H&H closely -No active bleeding noted and Hg stable around 8 -recommend recheck CBC in 1-2 weeks   Stage 2 sacral skin breakdown - wound care consult, air mattress recommended - recommend wound care nurse consult at facility  Discharge Diagnoses:  Principal Problem:   Uncontrolled type 2 diabetes mellitus with hyperglycemia ([place]) Active Problems:   Essential hypertension   GERD (gastroesophageal reflux disease)   Mixed hyperlipidemia   Femur fracture, right (HCC)   Hyperkalemia   Recurrent falls   Pseudohyponatremia   Thrombocytosis   DKA (diabetic ketoacidosis) ([place])   Discharge Instructions:  Allergies as of 11/14/2022       Reactions   Cozaar [losartan] Nausea And Vomiting, Swelling, Rash   Quinolones Itching, Nausea And Vomiting, Nausea Only, Swelling   Swelling of face, jaw, and lips   Cymbalta [duloxetine Hcl] Itching, Swelling   Sulfa Antibiotics Nausea And Vomiting, Swelling   Headache  Swelling of feet, legs   Synalar [fluocinolone] Swelling   Cipro [ciprofloxacin Hcl] Nausea And Vomiting, Swelling   Swelling of face, jaw, lips   Diovan [valsartan] Nausea And Vomiting, Swelling   Glucophage [metformin] Other (See Comments)   GI upset   Ketalar [ketamine] Swelling   Norco [hydrocodone-acetaminophen] Itching, Swelling   Norvasc [amlodipine Besylate] Itching, Swelling   Fluid retention    Nsaids Nausea And Vomiting, Other (See Comments)   Has bleeding ulcers   Tape Itching, Rash   Paper tape can only be used on this patient   Zestril [lisinopril] Swelling, Rash   Oral swelling Red streaks on arms/legs        Medication List     STOP taking these medications    Epogen 3000 UNIT/ML injection Generic drug: epoetin alfa   Farxiga 10 MG Tabs tablet Generic drug: dapagliflozin propanediol   furosemide 40 MG tablet Commonly known as:  LASIX   glipiZIDE 10 MG tablet Commonly known as: GLUCOTROL   naloxone 4 MG/0.1ML Liqd nasal spray kit Commonly known as: NARCAN   Ozempic (1 MG/DOSE) 4 MG/3ML Sopn Generic drug: Semaglutide (1 MG/DOSE)   potassium chloride SA 20 MEQ tablet Commonly known as: KLOR-CON M   spironolactone 25 MG tablet Commonly known as: ALDACTONE   tiZANidine 4 MG tablet Commonly known as: ZANAFLEX   Vitamin D (Ergocalciferol) 1.25 MG (50000 UNIT) Caps capsule Commonly known as: DRISDOL   ziprasidone 20 MG capsule Commonly known as: GEODON       TAKE these medications    acetaminophen 325 MG tablet Commonly known as: TYLENOL Take 2 tablets (650 mg total) by mouth every 4 (four) hours as needed for mild pain, moderate pain, fever or headache. What changed:  when to take this reasons to take this   allopurinol 100 MG tablet Commonly known as: ZYLOPRIM Take 100 mg by mouth daily.   atorvastatin 80 MG tablet Commonly known as: LIPITOR Take 1 tablet (80 mg total) by mouth daily. Restart on 06/29/22   diphenhydrAMINE 2 % cream Commonly known as: BENADRYL Apply topically 3 (three) times daily as needed for itching.   esomeprazole 40 MG capsule Commonly known as: NEXIUM Take 40 mg by mouth daily before breakfast.   hydrALAZINE 50 MG tablet Commonly known as: APRESOLINE Take 1 tablet (50 mg total) by mouth every 8 (eight) hours.  insulin aspart 100 UNIT/ML FlexPen Commonly known as: NOVOLOG Inject 5 Units into the skin 3 (three) times daily with meals. Give only if eats 50% or more of meal. What changed:  how much to take additional instructions   Levemir FlexPen 100 UNIT/ML FlexPen Generic drug: insulin detemir Inject 15 Units into the skin at bedtime. What changed: how much to take   levETIRAcetam 500 MG tablet Commonly known as: KEPPRA Take 500 mg by mouth 2 (two) times daily.   linagliptin 5 MG Tabs tablet Commonly known as: Tradjenta Take 1 tablet (5 mg total) by  mouth daily.   lipase/protease/amylase 36000 UNITS Cpep capsule Commonly known as: CREON Take 36,000 Units by mouth 3 (three) times daily with meals.   methocarbamol 500 MG tablet Commonly known as: ROBAXIN Take 1 tablet (500 mg total) by mouth every 8 (eight) hours as needed for muscle spasms.   metoprolol succinate 25 MG 24 hr tablet Commonly known as: TOPROL-XL Take 1.5 tablets (37.5 mg total) by mouth daily. What changed: how much to take   polyethylene glycol 17 g packet Commonly known as: MIRALAX / GLYCOLAX Take 17 g by mouth daily. Start taking on: November 15, 2022   venlafaxine XR 150 MG 24 hr capsule Commonly known as: EFFEXOR-XR Take 150 mg by mouth daily.        Contact information for after-discharge care     Destination     [facility] Preferred SNF .   Service: Skilled Nursing Contact information: [place] [address] [phone]                    Allergies  Allergen Reactions   Cozaar [Losartan] Nausea And Vomiting, Swelling and Rash   Quinolones Itching, Nausea And Vomiting, Nausea Only and Swelling    Swelling of face, jaw, and lips    Cymbalta [Duloxetine Hcl] Itching and Swelling   Sulfa Antibiotics Nausea And Vomiting and Swelling    Headache  Swelling of feet, legs    Synalar [Fluocinolone] Swelling   Cipro [Ciprofloxacin Hcl] Nausea And Vomiting and Swelling    Swelling of face, jaw, lips   Diovan [Valsartan] Nausea And Vomiting and Swelling   Glucophage [Metformin] Other (See Comments)    GI upset   Ketalar [Ketamine] Swelling   Norco [Hydrocodone-Acetaminophen] Itching and Swelling   Norvasc [Amlodipine Besylate] Itching and Swelling    Fluid retention    Nsaids Nausea And Vomiting and Other (See Comments)    Has bleeding ulcers   Tape Itching and Rash    Paper tape can only be used on this patient   Zestril [Lisinopril] Swelling and Rash     Oral swelling Red streaks on arms/legs   Allergies as of 11/14/2022       Reactions   Cozaar [losartan] Nausea And Vomiting, Swelling, Rash   Quinolones Itching, Nausea And Vomiting, Nausea Only, Swelling   Swelling of face, jaw, and lips   Cymbalta [duloxetine Hcl] Itching, Swelling   Sulfa Antibiotics Nausea And Vomiting, Swelling   Headache  Swelling of feet, legs   Synalar [fluocinolone] Swelling   Cipro [ciprofloxacin Hcl] Nausea And Vomiting, Swelling   Swelling of face, jaw, lips   Diovan [valsartan] Nausea And Vomiting, Swelling   Glucophage [metformin] Other (See Comments)   GI upset   Ketalar [ketamine] Swelling   Norco [hydrocodone-acetaminophen] Itching, Swelling   Norvasc [amlodipine Besylate] Itching, Swelling   Fluid retention  Nsaids Nausea And Vomiting, Other (See Comments)   Has bleeding ulcers   Tape Itching, Rash   Paper tape can only be used on this patient   Zestril [lisinopril] Swelling, Rash   Oral swelling Red streaks on arms/legs        Medication List     STOP taking these medications    Epogen 3000 UNIT/ML injection Generic drug: epoetin alfa   Farxiga 10 MG Tabs tablet Generic drug: dapagliflozin propanediol   furosemide 40 MG tablet Commonly known as: LASIX   glipiZIDE 10 MG tablet Commonly known as: GLUCOTROL   naloxone 4 MG/0.1ML Liqd nasal spray kit Commonly known as: NARCAN   Ozempic (1 MG/DOSE) 4 MG/3ML Sopn Generic drug: Semaglutide (1 MG/DOSE)   potassium chloride SA 20 MEQ tablet Commonly known as: KLOR-CON M   spironolactone 25 MG tablet Commonly known as: ALDACTONE   tiZANidine 4 MG tablet Commonly known as: ZANAFLEX   Vitamin D (Ergocalciferol) 1.25 MG (50000 UNIT) Caps capsule Commonly known as: DRISDOL   ziprasidone 20 MG capsule Commonly known as: GEODON       TAKE these medications    acetaminophen 325 MG tablet Commonly known as: TYLENOL Take 2 tablets (650 mg total) by mouth every 4 (four)  hours as needed for mild pain, moderate pain, fever or headache. What changed:  when to take this reasons to take this   allopurinol 100 MG tablet Commonly known as: ZYLOPRIM Take 100 mg by mouth daily.   atorvastatin 80 MG tablet Commonly known as: LIPITOR Take 1 tablet (80 mg total) by mouth daily. Restart on 06/29/22   diphenhydrAMINE 2 % cream Commonly known as: BENADRYL Apply topically 3 (three) times daily as needed for itching.   esomeprazole 40 MG capsule Commonly known as: NEXIUM Take 40 mg by mouth daily before breakfast.   hydrALAZINE 50 MG tablet Commonly known as: APRESOLINE Take 1 tablet (50 mg total) by mouth every 8 (eight) hours.   insulin aspart 100 UNIT/ML FlexPen Commonly known as: NOVOLOG Inject 5 Units into the skin 3 (three) times daily with meals. Give only if eats 50% or more of meal. What changed:  how much to take additional instructions   Levemir FlexPen 100 UNIT/ML FlexPen Generic drug: insulin detemir Inject 15 Units into the skin at bedtime. What changed: how much to take   levETIRAcetam 500 MG tablet Commonly known as: KEPPRA Take 500 mg by mouth 2 (two) times daily.   linagliptin 5 MG Tabs tablet Commonly known as: Tradjenta Take 1 tablet (5 mg total) by mouth daily.   lipase/protease/amylase 36000 UNITS Cpep capsule Commonly known as: CREON Take 36,000 Units by mouth 3 (three) times daily with meals.   methocarbamol 500 MG tablet Commonly known as: ROBAXIN Take 1 tablet (500 mg total) by mouth every 8 (eight) hours as needed for muscle spasms.   metoprolol succinate 25 MG 24 hr tablet Commonly known as: TOPROL-XL Take 1.5 tablets (37.5 mg total) by mouth daily. What changed: how much to take   polyethylene glycol 17 g packet Commonly known as: MIRALAX / GLYCOLAX Take 17 g by mouth daily. Start taking on: November 15, 2022   venlafaxine XR 150 MG 24 hr capsule Commonly known as: EFFEXOR-XR Take 150 mg by mouth daily.         Procedures/Studies: CT Head Wo Contrast  Result Date: 11/11/2022 CLINICAL DATA:  Head trauma. Blunt. Fall yesterday with bruising to left side of head. EXAM: CT HEAD WITHOUT CONTRAST  TECHNIQUE: Contiguous axial images were obtained from the base of the skull through the vertex without intravenous contrast. RADIATION DOSE REDUCTION: This exam was performed according to the departmental dose-optimization program which includes automated exposure control, adjustment of the mA and/or kV according to patient size and/or use of iterative reconstruction technique. COMPARISON:  CT head without contrast 10/15/2022 FINDINGS: Brain: Asymmetric hyperdensity within the left lentiform nucleus is similar the prior exam. No significant right-sided density is present. No acute hemorrhage contusion is evident. No acute infarct is present. Moderate generalized atrophy and white matter disease is similar the prior study. The ventricles are proportionate to the degree of atrophy. No significant extraaxial fluid collection is present. The brainstem and cerebellum are within normal limits. Midline structures are within normal limits. Vascular: Dense atherosclerotic calcifications are present within the cavernous internal carotid arteries and at the dural margin of both vertebral arteries. No hyperdense vessel is present. Skull: Calvarium is intact. No focal lytic or blastic lesions are present. No significant extracranial soft tissue lesion is present. Sinuses/Orbits: The paranasal sinuses and mastoid air cells are clear. Bilateral lens replacements are noted. Globes and orbits are otherwise unremarkable. IMPRESSION: 1. No acute intracranial abnormality or significant interval change. 2. Stable moderate generalized atrophy and white matter disease. This likely reflects the sequela of chronic microvascular ischemia. 3. Persistent asymmetric density within the left lentiform nucleus. This has been described in the setting of  nonketotic hyperglycemic hemichorea. This would be consistent with the patient's history of hyperglycemia. The finding typically resolves over time. Electronically Signed   By: San Morelle M.D.   On: 11/11/2022 18:23   CT Cervical Spine Wo Contrast  Result Date: 11/11/2022 CLINICAL DATA:  Fall EXAM: CT CERVICAL SPINE WITHOUT CONTRAST TECHNIQUE: Multidetector CT imaging of the cervical spine was performed without intravenous contrast. Multiplanar CT image reconstructions were also generated. RADIATION DOSE REDUCTION: This exam was performed according to the departmental dose-optimization program which includes automated exposure control, adjustment of the mA and/or kV according to patient size and/or use of iterative reconstruction technique. COMPARISON:  10/15/2022 FINDINGS: Alignment: No subluxation Skull base and vertebrae: No acute fracture. No primary bone lesion or focal pathologic process. Soft tissues and spinal canal: No prevertebral fluid or swelling. No visible canal hematoma. Disc levels: Prior fusion throughout the cervical spine from C3 to T1. Anterior plate remain in place at C3-4 and C7-T1. Posterior fusion at C7-T1. Upper chest: No acute findings Other: None IMPRESSION: Postoperative and degenerative changes.  No acute bony abnormality. Electronically Signed   By: Rolm Baptise M.D.   On: 11/11/2022 18:15   DG Humerus Right  Result Date: 11/11/2022 CLINICAL DATA:  Trauma EXAM: RIGHT HUMERUS - 2+ VIEW COMPARISON:  10/15/2022 FINDINGS: Right humeral neck fracture again noted. There is difference in alignment of the humeral head and humeral shaft when compared to prior study suggesting re-injury or new fracture superimposed on subacute to chronic fracture. No subluxation or dislocation. IMPRESSION: Right humeral neck fracture, likely new fracture superimposed on subacute to chronic fracture. Electronically Signed   By: Rolm Baptise M.D.   On: 11/11/2022 18:04   US RENAL  Result Date:  10/18/2022 CLINICAL DATA:  Acute kidney injury EXAM: RENAL / URINARY TRACT ULTRASOUND COMPLETE COMPARISON:  CT 07/04/2022 FINDINGS: Right Kidney: Renal measurements: 10.2 x 5.3 x 5.0 cm = volume: 142.0 mL. No hydronephrosis. Increased renal cortical echogenicity. Left Kidney: Renal measurements: 10.8 x 5.4 x 5.3 cm = volume: 160.8 mL. No hydronephrosis. Increased renal cortical  echogenicity. Bladder: Appears normal for degree of bladder distention. Other: None. IMPRESSION: No hydronephrosis. Increased renal cortical echogenicity, as can be seen in medical renal disease. Electronically Signed   By: Maurine Simmering M.D.   On: 10/18/2022 13:12   MR LUMBAR SPINE WO CONTRAST  Result Date: 10/17/2022 CLINICAL DATA:  Low back pain after a fall. EXAM: MRI LUMBAR SPINE WITHOUT CONTRAST TECHNIQUE: Multiplanar, multisequence MR imaging of the lumbar spine was performed. No intravenous contrast was administered. COMPARISON:  MRI lumbar spine dated June 20, 2022. FINDINGS: Segmentation:  Standard. Alignment:  Unchanged 3 mm retrolisthesis at L1-L2. Vertebrae: Chronic T12 superior endplate compression fracture continues to demonstrate fluid within the fracture cleft and associated marrow edema, without progressive height loss. Unchanged 3 mm retropulsion of the posterosuperior endplate. No new fracture. Prior L1-L4 fusion. No evidence of discitis or suspicious bone lesion. Conus medullaris and cauda equina: Conus extends to the T12-L1 level. Conus and cauda equina appear normal. Paraspinal and other soft tissues: Negative. Disc levels: T11-T12: Unchanged minimal disc bulging and mild bilateral facet arthropathy. Unchanged mild right neuroforaminal stenosis. No spinal canal or left neuroforaminal stenosis. T12-L1:  Unchanged minimal disc bulging.  No stenosis. L1-L2: Prior posterior fusion. No significant disc bulge or herniation. No stenosis. L2-L3:  Prior PLIF.  No residual stenosis. L3-L4:  Prior PLIF.  No residual  stenosis. L4-L5: Unchanged small broad-based posterior disc protrusion and moderate bilateral facet arthropathy. Unchanged mild to moderate left neuroforaminal stenosis. No spinal canal or right neuroforaminal stenosis. L5-S1: Negative disc. Unchanged mild-to-moderate bilateral facet arthropathy. No stenosis. IMPRESSION: 1. Chronic T12 superior endplate compression fracture with persistent marrow edema but no progressive height loss. No new fracture. 2. Prior L1-L4 fusion without residual stenosis. 3. Unchanged mild-to-moderate left neuroforaminal stenosis at L4-L5. Electronically Signed   By: Titus Dubin M.D.   On: 10/17/2022 14:58   CT Head Wo Contrast  Result Date: 10/15/2022 CLINICAL DATA:  Head trauma EXAM: CT HEAD WITHOUT CONTRAST TECHNIQUE: Contiguous axial images were obtained from the base of the skull through the vertex without intravenous contrast. RADIATION DOSE REDUCTION: This exam was performed according to the departmental dose-optimization program which includes automated exposure control, adjustment of the mA and/or kV according to patient size and/or use of iterative reconstruction technique. COMPARISON:  CT head 08/22/2022.  MRI brain 08/23/2022. FINDINGS: Brain: No evidence of acute infarction, hemorrhage, hydrocephalus, extra-axial collection or mass lesion/mass effect. There is mild increased asymmetric hyperdensity in the left lentiform. There is no surrounding edema or mass effect. This is a new finding. This is area not convincing for acute hemorrhage. Vascular: Atherosclerotic calcifications are present within the cavernous internal carotid arteries. Skull: Normal. Negative for fracture or focal lesion. Sinuses/Orbits: No acute finding. Other: There is soft tissue swelling overlying the posterior right parietal region. IMPRESSION: 1. No acute intracranial hemorrhage. 2. Mild increased asymmetric hyperdensity in the left lentiform. Findings are indeterminate, but can be seen with  nonketotic hyperglycemic hemichorea. 3. Soft tissue swelling overlying the posterior right parietal region. Electronically Signed   By: Ronney Asters M.D.   On: 10/15/2022 17:40   CT Cervical Spine Wo Contrast  Result Date: 10/15/2022 CLINICAL DATA:  Trauma EXAM: CT CERVICAL SPINE WITHOUT CONTRAST TECHNIQUE: Multidetector CT imaging of the cervical spine was performed without intravenous contrast. Multiplanar CT image reconstructions were also generated. RADIATION DOSE REDUCTION: This exam was performed according to the departmental dose-optimization program which includes automated exposure control, adjustment of the mA and/or kV according to patient size  and/or use of iterative reconstruction technique. COMPARISON:  08/20/2022 FINDINGS: Alignment: Postoperative straightening of the normal cervical lordosis. Skull base and vertebrae: No acute fracture. No primary bone lesion or focal pathologic process. Soft tissues and spinal canal: No prevertebral fluid or swelling. No visible canal hematoma. Disc levels: Extensive multilevel discectomy and fusion from C3 through T1. Upper chest: Negative. Other: None. IMPRESSION: 1. No fracture or static subluxation of the cervical spine. 2. Extensive multilevel discectomy and fusion from C3 through T1. Electronically Signed   By: Delanna Ahmadi M.D.   On: 10/15/2022 17:31   DG Shoulder Right  Result Date: 10/15/2022 CLINICAL DATA:  Pelvis morning.  Possible history of fracture. EXAM: RIGHT SHOULDER - 2+ VIEW; RIGHT HUMERUS - 2+ VIEW COMPARISON:  Right shoulder radiographs 03/28/2022, 07/24/2022; FINDINGS: There is diffuse decreased bone mineralization. Redemonstration of comminuted, impacted now subacute to chronic fracture of the proximal right humerus including the inferior aspect of the humeral head and the surgical neck. There is high-grade (approximately 2.7 cm craniocaudal dimension) impaction of the humeral shaft into the inferior aspect of the humeral head.  High-grade bone loss of the inferior aspect of the humeral head. Approximately 1.4 cm anterior displacement of the humeral shaft on lateral view, unchanged. Moderate glenohumeral joint space narrowing. Old unchanged bone fragments overlying the inferior glenohumeral joint space. Remote healed fracture of the mid to lateral right clavicular shaft with associated clavicle shortening, unchanged. No acute fracture is seen.  No dislocation. Partial visualization of lower cervical spine anterior and posterior fusion hardware. IMPRESSION: Redemonstration of comminuted, impacted now subacute to chronic fracture of the proximal right humerus including the inferior aspect of the humeral head and the surgical neck. There is high-grade impaction of the humeral shaft into the inferior aspect of the humeral head. No significant change from 07/24/2022. Electronically Signed   By: Yvonne Kendall M.D.   On: 10/15/2022 17:11   DG Humerus Right  Result Date: 10/15/2022 CLINICAL DATA:  Pelvis morning.  Possible history of fracture. EXAM: RIGHT SHOULDER - 2+ VIEW; RIGHT HUMERUS - 2+ VIEW COMPARISON:  Right shoulder radiographs 03/28/2022, 07/24/2022; FINDINGS: There is diffuse decreased bone mineralization. Redemonstration of comminuted, impacted now subacute to chronic fracture of the proximal right humerus including the inferior aspect of the humeral head and the surgical neck. There is high-grade (approximately 2.7 cm craniocaudal dimension) impaction of the humeral shaft into the inferior aspect of the humeral head. High-grade bone loss of the inferior aspect of the humeral head. Approximately 1.4 cm anterior displacement of the humeral shaft on lateral view, unchanged. Moderate glenohumeral joint space narrowing. Old unchanged bone fragments overlying the inferior glenohumeral joint space. Remote healed fracture of the mid to lateral right clavicular shaft with associated clavicle shortening, unchanged. No acute fracture is  seen.  No dislocation. Partial visualization of lower cervical spine anterior and posterior fusion hardware. IMPRESSION: Redemonstration of comminuted, impacted now subacute to chronic fracture of the proximal right humerus including the inferior aspect of the humeral head and the surgical neck. There is high-grade impaction of the humeral shaft into the inferior aspect of the humeral head. No significant change from 07/24/2022. Electronically Signed   By: Yvonne Kendall M.D.   On: 10/15/2022 17:11     Subjective: Pt had a bowel movement this morning.  No other complaints.   Discharge Exam: Vitals:   11/14/22 1100 11/14/22 1128  BP: (!) 151/43   Pulse: 94 96  Resp: 13 12  Temp:  (!) 97.5 F (  36.4 C)  SpO2: 100% 100%   Vitals:   11/14/22 0913 11/14/22 1000 11/14/22 1100 11/14/22 1128  BP: (!) 142/53 (!) 147/71 (!) 151/43   Pulse: (!) 114 93 94 96  Resp:  '15 13 12  '$ Temp:    (!) 97.5 F (36.4 C)  TempSrc:    Oral  SpO2:  100% 100% 100%  Weight:      Height:        General: Pt is alert, awake, not in acute distress Cardiovascular: normal S1/S2 +, no rubs, no gallops Respiratory: CTA bilaterally, no wheezing, no rhonchi Abdominal: Soft, NT, ND, bowel sounds + Extremities: no edema, no cyanosis Skin: stage 2 sacral skin breakdown   The results of significant diagnostics from this hospitalization (including imaging, microbiology, ancillary and laboratory) are listed below for reference.     Microbiology: Recent Results (from the past 240 hour(s))  Urine Culture     Status: None   Collection Time: 11/11/22  5:27 PM   Specimen: Urine, Clean Catch  Result Value Ref Range Status   Specimen Description   Final    URINE, CLEAN CATCH Performed at [facility], [address] [place], [place] [address]    Special Requests   Final    NONE Performed at [facility], [address], [place], [place] [address]    Culture   Final    NO GROWTH Performed at [facility], [place] [address] [place], [place] [address]    Report Status 11/14/2022 FINAL  Final  MRSA Next Gen by PCR, Nasal     Status: Abnormal   Collection Time: 11/12/22  6:00 PM   Specimen: Nasal Mucosa; Nasal Swab  Result Value Ref Range Status   MRSA by PCR Next Gen DETECTED (A) NOT DETECTED Final    Comment: RESULT CALLED TO, READ BACK BY AND VERIFIED WITH: R ESTOCE RN 2100 [id number] K FORSYTH (NOTE) The GeneXpert MRSA Assay (FDA approved for NASAL specimens only), is one component of a comprehensive MRSA colonization surveillance program. It is not intended to diagnose MRSA infection nor to guide or monitor treatment for MRSA infections. Test performance is not FDA approved in patients less than 11 years old. Performed at [facility], [address], [place],  [address]      Labs: BNP (last 3 results) No results for input(s): "BNP" in the last 8760 hours. Basic Metabolic Panel: Recent Labs  Lab 11/11/22 2012 11/12/22 0838 11/12/22 1855 11/13/22 0325 11/14/22 0443  NA 115* 126* 126* 128* 129*  K 5.9* 4.3 4.5 3.7 4.5  CL 85* 95* 96* 99 101  CO2 14* 14* 17* 18* 19*  GLUCOSE 895* 178* 217* 179* 300*  BUN 132* 126* 123* 116* 97*  CREATININE 3.47* 2.99* 2.81* 2.56* 2.32*  CALCIUM 9.6 10.0 9.6 9.1 8.7*  MG  --  2.9*  --   --   --   PHOS  --  4.0  --  2.8 3.4   Liver Function Tests: Recent Labs  Lab 11/11/22 1818 11/12/22 0838 11/13/22 0325 11/14/22 0443  AST 18 22  --   --   ALT 14 14  --   --   ALKPHOS 172* 171*  --   --   BILITOT 0.7 0.5  --   --   PROT 7.8 7.7  --   --   ALBUMIN 3.5 3.6 2.6* 2.6*   No results for input(s): "LIPASE", "AMYLASE" in the last 168 hours. No results for  input(s): "AMMONIA" in the last 168 hours. CBC: Recent Labs  Lab 11/11/22 1818 11/12/22 0838 11/13/22 0325  WBC 10.1 14.5* 10.7*  NEUTROABS 8.1*  --   --   HGB 10.4* 10.9* 8.3*  HCT 31.4* 32.5* 24.3*  MCV 88.7 88.1 87.4  PLT 443* 339 266   Cardiac Enzymes: No results for input(s):  "CKTOTAL", "CKMB", "CKMBINDEX", "TROPONINI" in the last 168 hours. BNP: Invalid input(s): "POCBNP" CBG: Recent Labs  Lab 11/13/22 1213 11/13/22 1615 11/13/22 2132 11/14/22 0737 11/14/22 1127  GLUCAP 151* 168* 196* 330* 265*   D-Dimer No results for input(s): "DDIMER" in the last 72 hours. Hgb A1c No results for input(s): "HGBA1C" in the last 72 hours. Lipid Profile No results for input(s): "CHOL", "HDL", "LDLCALC", "TRIG", "CHOLHDL", "LDLDIRECT" in the last 72 hours. Thyroid function studies No results for input(s): "TSH", "T4TOTAL", "T3FREE", "THYROIDAB" in the last 72 hours.  Invalid input(s): "FREET3" Anemia work up Recent Labs    11/14/22 0443  VITAMINB12 837  FOLATE 18.3  FERRITIN 1,832*  TIBC 172*  IRON 153   Urinalysis    Component Value Date/Time   COLORURINE YELLOW 11/11/2022 1727   APPEARANCEUR HAZY (A) 11/11/2022 1727   LABSPEC 1.011 11/11/2022 1727   PHURINE 5.0 11/11/2022 1727   GLUCOSEU >=500 (A) 11/11/2022 1727   GLUCOSEU NEGATIVE 03/28/2018 [place] 11/11/2022 1727   BILIRUBINUR NEGATIVE 11/11/2022 1727   KETONESUR NEGATIVE 11/11/2022 1727   PROTEINUR 100 (A) 11/11/2022 1727   UROBILINOGEN 1.0 03/28/2018 1224   NITRITE NEGATIVE 11/11/2022 1727   LEUKOCYTESUR NEGATIVE 11/11/2022 1727   Sepsis Labs Recent Labs  Lab 11/11/22 1818 11/12/22 0838 11/13/22 0325  WBC 10.1 14.5* 10.7*   Microbiology Recent Results (from the past 240 hour(s))  Urine Culture     Status: None   Collection Time: 11/11/22  5:27 PM   Specimen: Urine, Clean Catch  Result Value Ref Range Status   Specimen Description   Final    URINE, CLEAN CATCH Performed at [facility], [address], [place], [place] [address]    Special Requests   Final    NONE Performed at [facility], [address], [place], [place] [address]    Culture   Final    NO GROWTH Performed at [facility], [place] [address] [place], [place] [address]    Report Status  11/14/2022 FINAL  Final  MRSA Next Gen by PCR, Nasal     Status: Abnormal   Collection Time: 11/12/22  6:00 PM   Specimen: Nasal Mucosa; Nasal Swab  Result Value Ref Range Status   MRSA by PCR Next Gen DETECTED (A) NOT DETECTED Final    Comment: RESULT CALLED TO, READ BACK BY AND VERIFIED WITH: R ESTOCE RN 2100 [id number] K FORSYTH (NOTE) The GeneXpert MRSA Assay (FDA approved for NASAL specimens only), is one component of a comprehensive MRSA colonization surveillance program. It is not intended to diagnose MRSA infection nor to guide or monitor treatment for MRSA infections. Test performance is not FDA approved in patients less than 59 years old. Performed at [facility], [address] [place],  [address]    Time coordinating discharge: 36 mins   SIGNED:  Irwin Brakeman, MD  [facility] 11/14/2022, 12:09 PM How to contact the [facility] Attending or Consulting provider [place] or covering provider during after hours [place], for this patient?  Check the care team in [facility] and look for a) attending/consulting [facility] provider listed  and b) the [facility] team listed Log into [email or web address] and use [place] universal password to access. If you do not have the password, please contact the hospital operator. Locate the [facility] provider you are looking for under [facility] and page to a number that you can be directly reached. If you still have difficulty reaching the provider, please page the [facility] (Director on Call) for the Hospitalists listed on amion for assistance.

## 2022-11-14 NOTE — Progress Notes (Signed)
[facility] Progress Note    Assessment/ Plan:   AKI on CKD4 -baseline Cr mid 2's, follows with Dr. Theador Hawthorne. H/o multiple AKIs -AKI likely related to HHS/DKA. Cr improving, down to 2.3 today -c/w IVF especially given hyperglcemia this AM -no indication for RRT -Avoid nephrotoxic medications including NSAIDs and iodinated intravenous contrast exposure unless the latter is absolutely indicated.  Preferred narcotic agents for pain control are hydromorphone, fentanyl, and methadone. Morphine should not be used. Avoid Baclofen and avoid oral sodium phosphate and magnesium citrate based laxatives / bowel preps. Continue strict Input and Output monitoring. Will monitor the patient closely with you and intervene or adjust therapy as indicated by changes in clinical status/labs   HHS/DKA -mgmt per primary service  HTN -improved as compared to yesterday. Can increase hydralazine to '100mg'$  TID if needed  Anemia of chronic disease -s/p ESA 1/16. Transfuse PRN  Recurrent falls -SNF pending  Subjective:   Patient seen and examined in ICU. Patient reports that her biggest issue right now is constipation. No other complaints   Objective:   BP (!) 142/53   Pulse (!) 114   Temp (!) 97.2 F (36.2 C) (Oral)   Resp 15   Ht '5\' 4"'$  (1.626 m)   Wt 51.3 kg   LMP  (LMP Unknown)   SpO2 100%   BMI 19.41 kg/m   Intake/Output Summary (Last 24 hours) at 11/14/2022 0948 Last data filed at 11/14/2022 0600 Gross per 24 hour  Intake 387.27 ml  Output 500 ml  Net -112.73 ml   Weight change:   Physical Exam: Gen: NAD CVS: tachycardic Resp: CTA b/l Abd: soft, nt/nd Ext: no sig edema Neuro: awake, alert  Imaging: No results found.  Labs: BMET Recent Labs  Lab 11/11/22 1818 11/11/22 2012 11/12/22 0838 11/12/22 1855 11/13/22 0325 11/14/22 0443  NA 116* 115* 126* 126* 128* 129*  K 5.7* 5.9* 4.3 4.5 3.7 4.5  CL 82* 85* 95* 96* 99 101  CO2 19* 14* 14* 17* 18* 19*  GLUCOSE  893* 895* 178* 217* 179* 300*  BUN 133* 132* 126* 123* 116* 97*  CREATININE 3.36* 3.47* 2.99* 2.81* 2.56* 2.32*  CALCIUM 10.0 9.6 10.0 9.6 9.1 8.7*  PHOS  --   --  4.0  --  2.8 3.4   CBC Recent Labs  Lab 11/11/22 1818 11/12/22 0838 11/13/22 0325  WBC 10.1 14.5* 10.7*  NEUTROABS 8.1*  --   --   HGB 10.4* 10.9* 8.3*  HCT 31.4* 32.5* 24.3*  MCV 88.7 88.1 87.4  PLT 443* 339 266    Medications:     allopurinol  100 mg Oral Daily   atorvastatin  80 mg Oral Daily   Chlorhexidine Gluconate Cloth  6 each Topical Daily   darbepoetin (ARANESP) injection - NON-DIALYSIS  200 mcg Subcutaneous Q Tue-1800   heparin  5,000 Units Subcutaneous Q8H   hydrALAZINE  50 mg Oral Q8H   insulin aspart  0-5 Units Subcutaneous QHS   insulin aspart  0-9 Units Subcutaneous TID WC   insulin aspart  12 Units Subcutaneous Once   insulin aspart  5 Units Subcutaneous TID WC   insulin detemir  10 Units Subcutaneous Daily   levETIRAcetam  500 mg Oral BID   lipase/protease/amylase  36,000 Units Oral TID WC   methocarbamol  500 mg Oral TID   metoprolol succinate  37.5 mg Oral Daily   mupirocin ointment  1 Application Nasal BID   pantoprazole  40 mg Oral Daily  polyethylene glycol  17 g Oral Daily   venlafaxine XR  150 mg Oral Daily      Gean Quint, MD [facility] 11/14/2022, 9:48 AM

## 2022-11-14 NOTE — TOC Transition Note (Signed)
Transition of Care [facility]) - CM/SW Discharge Note   Patient Details  Name: KAYSLEE FUREY MRN: [id number] Date of Birth: Apr 18, 1951  [facility] CM/SW Contact:  Boneta Lucks, RN Phone Number: 11/14/2022, 11:32 AM   Clinical Narrative:   Patient discharging to [facility], RN to call report. TOC left husband a message to updated. Spoke with him yesterday to let him know Josem Kaufmann was received and she would discharge today. Debbie provided room A27-2.   Final next level of care: Skilled Nursing Facility Barriers to Discharge: Barriers Resolved   Patient Goals and CMS Choice CMS Medicare.gov Compare Post Acute Care list provided to:: Patient Represenative (must comment) Choice offered to / list presented to : Spouse  Discharge Placement              Patient to be transferred to facility by: EMS Name of family member notified: Husband Patient and family notified of of transfer: 11/14/22  Discharge Plan and Services Additional resources added to the After Visit Summary for         Social Determinants of Health ([place]) Interventions SDOH Screenings   Food Insecurity: No Food Insecurity (11/12/2022)  Housing: Low Risk  (11/12/2022)  Transportation Needs: No Transportation Needs (11/12/2022)  Utilities: Not At Risk (11/12/2022)  Tobacco Use: Low Risk  (11/11/2022)     Readmission Risk Interventions    11/14/2022   11:31 AM 11/12/2022    4:06 PM 10/18/2022   12:42 PM  Readmission Risk Prevention Plan  Transportation Screening  Complete Complete  Medication Review Press photographer)  Complete Complete  PCP or Specialist appointment within 3-5 days of discharge Complete Not Complete   HRI or Home Care Consult  Complete Complete  SW Recovery Care/Counseling Consult  Complete Complete  Palliative Care Screening  Not Applicable Not Applicable  Skilled Nursing Facility  Complete Complete

## 2022-11-15 DIAGNOSIS — R296 Repeated falls: Secondary | ICD-10-CM | POA: Diagnosis not present

## 2022-11-15 DIAGNOSIS — M6281 Muscle weakness (generalized): Secondary | ICD-10-CM | POA: Diagnosis not present

## 2022-11-15 DIAGNOSIS — E119 Type 2 diabetes mellitus without complications: Secondary | ICD-10-CM | POA: Diagnosis not present

## 2022-11-17 DIAGNOSIS — M6281 Muscle weakness (generalized): Secondary | ICD-10-CM | POA: Diagnosis not present

## 2022-11-17 DIAGNOSIS — R296 Repeated falls: Secondary | ICD-10-CM | POA: Diagnosis not present

## 2022-11-17 DIAGNOSIS — E119 Type 2 diabetes mellitus without complications: Secondary | ICD-10-CM | POA: Diagnosis not present

## 2022-11-17 DIAGNOSIS — K5901 Slow transit constipation: Secondary | ICD-10-CM | POA: Diagnosis not present

## 2022-11-22 DIAGNOSIS — I10 Essential (primary) hypertension: Secondary | ICD-10-CM | POA: Diagnosis not present

## 2022-11-22 DIAGNOSIS — N189 Chronic kidney disease, unspecified: Secondary | ICD-10-CM | POA: Diagnosis not present

## 2022-11-22 DIAGNOSIS — K219 Gastro-esophageal reflux disease without esophagitis: Secondary | ICD-10-CM | POA: Diagnosis not present

## 2022-11-22 DIAGNOSIS — D649 Anemia, unspecified: Secondary | ICD-10-CM | POA: Diagnosis not present

## 2022-11-22 DIAGNOSIS — E785 Hyperlipidemia, unspecified: Secondary | ICD-10-CM | POA: Diagnosis not present

## 2022-11-22 DIAGNOSIS — M109 Gout, unspecified: Secondary | ICD-10-CM | POA: Diagnosis not present

## 2022-11-22 DIAGNOSIS — S72001D Fracture of unspecified part of neck of right femur, subsequent encounter for closed fracture with routine healing: Secondary | ICD-10-CM | POA: Diagnosis not present

## 2022-11-22 DIAGNOSIS — E1165 Type 2 diabetes mellitus with hyperglycemia: Secondary | ICD-10-CM | POA: Diagnosis not present

## 2022-11-23 DIAGNOSIS — R239 Unspecified skin changes: Secondary | ICD-10-CM | POA: Diagnosis not present

## 2022-11-23 DIAGNOSIS — E1165 Type 2 diabetes mellitus with hyperglycemia: Secondary | ICD-10-CM | POA: Diagnosis not present

## 2022-11-23 DIAGNOSIS — K5901 Slow transit constipation: Secondary | ICD-10-CM | POA: Diagnosis not present

## 2022-11-23 DIAGNOSIS — M6281 Muscle weakness (generalized): Secondary | ICD-10-CM | POA: Diagnosis not present

## 2022-11-23 DIAGNOSIS — E11622 Type 2 diabetes mellitus with other skin ulcer: Secondary | ICD-10-CM | POA: Diagnosis not present

## 2022-11-23 DIAGNOSIS — R296 Repeated falls: Secondary | ICD-10-CM | POA: Diagnosis not present

## 2022-11-23 DIAGNOSIS — N3946 Mixed incontinence: Secondary | ICD-10-CM | POA: Diagnosis not present

## 2022-11-23 DIAGNOSIS — L89313 Pressure ulcer of right buttock, stage 3: Secondary | ICD-10-CM | POA: Diagnosis not present

## 2022-11-23 DIAGNOSIS — E119 Type 2 diabetes mellitus without complications: Secondary | ICD-10-CM | POA: Diagnosis not present

## 2022-11-26 DIAGNOSIS — E1165 Type 2 diabetes mellitus with hyperglycemia: Secondary | ICD-10-CM | POA: Diagnosis not present

## 2022-11-28 ENCOUNTER — Encounter: Payer: Self-pay | Admitting: Neurology

## 2022-11-28 DIAGNOSIS — E11622 Type 2 diabetes mellitus with other skin ulcer: Secondary | ICD-10-CM | POA: Diagnosis not present

## 2022-11-28 DIAGNOSIS — L89313 Pressure ulcer of right buttock, stage 3: Secondary | ICD-10-CM | POA: Diagnosis not present

## 2022-11-28 DIAGNOSIS — R239 Unspecified skin changes: Secondary | ICD-10-CM | POA: Diagnosis not present

## 2022-11-28 DIAGNOSIS — N3946 Mixed incontinence: Secondary | ICD-10-CM | POA: Diagnosis not present

## 2022-11-28 DIAGNOSIS — M6281 Muscle weakness (generalized): Secondary | ICD-10-CM | POA: Diagnosis not present

## 2022-11-30 DIAGNOSIS — N183 Chronic kidney disease, stage 3 unspecified: Secondary | ICD-10-CM | POA: Diagnosis not present

## 2022-11-30 DIAGNOSIS — N200 Calculus of kidney: Secondary | ICD-10-CM | POA: Diagnosis not present

## 2022-11-30 DIAGNOSIS — E1165 Type 2 diabetes mellitus with hyperglycemia: Secondary | ICD-10-CM | POA: Diagnosis not present

## 2022-11-30 DIAGNOSIS — E871 Hypo-osmolality and hyponatremia: Secondary | ICD-10-CM | POA: Diagnosis not present

## 2022-11-30 DIAGNOSIS — I10 Essential (primary) hypertension: Secondary | ICD-10-CM | POA: Diagnosis not present

## 2022-11-30 DIAGNOSIS — R5381 Other malaise: Secondary | ICD-10-CM | POA: Diagnosis not present

## 2022-11-30 DIAGNOSIS — E875 Hyperkalemia: Secondary | ICD-10-CM | POA: Diagnosis not present

## 2022-11-30 DIAGNOSIS — N189 Chronic kidney disease, unspecified: Secondary | ICD-10-CM | POA: Diagnosis not present

## 2022-12-03 DIAGNOSIS — E871 Hypo-osmolality and hyponatremia: Secondary | ICD-10-CM | POA: Diagnosis not present

## 2022-12-03 DIAGNOSIS — N189 Chronic kidney disease, unspecified: Secondary | ICD-10-CM | POA: Diagnosis not present

## 2022-12-03 DIAGNOSIS — N183 Chronic kidney disease, stage 3 unspecified: Secondary | ICD-10-CM | POA: Diagnosis not present

## 2022-12-03 DIAGNOSIS — E875 Hyperkalemia: Secondary | ICD-10-CM | POA: Diagnosis not present

## 2022-12-03 DIAGNOSIS — E1165 Type 2 diabetes mellitus with hyperglycemia: Secondary | ICD-10-CM | POA: Diagnosis not present

## 2022-12-03 DIAGNOSIS — R5381 Other malaise: Secondary | ICD-10-CM | POA: Diagnosis not present

## 2022-12-03 DIAGNOSIS — I10 Essential (primary) hypertension: Secondary | ICD-10-CM | POA: Diagnosis not present

## 2022-12-03 DIAGNOSIS — N200 Calculus of kidney: Secondary | ICD-10-CM | POA: Diagnosis not present

## 2022-12-10 DIAGNOSIS — M25511 Pain in right shoulder: Secondary | ICD-10-CM | POA: Diagnosis not present

## 2022-12-11 ENCOUNTER — Emergency Department (HOSPITAL_COMMUNITY): Payer: Medicare PPO

## 2022-12-11 ENCOUNTER — Encounter (HOSPITAL_COMMUNITY): Payer: Self-pay | Admitting: *Deleted

## 2022-12-11 ENCOUNTER — Other Ambulatory Visit: Payer: Self-pay

## 2022-12-11 ENCOUNTER — Inpatient Hospital Stay (HOSPITAL_COMMUNITY)
Admission: EM | Admit: 2022-12-11 | Discharge: 2022-12-18 | DRG: 481 | Disposition: A | Discharge status: Skilled Nursing Facility | Payer: Medicare PPO | Attending: Internal Medicine | Admitting: Internal Medicine

## 2022-12-11 DIAGNOSIS — N390 Urinary tract infection, site not specified: Secondary | ICD-10-CM | POA: Diagnosis present

## 2022-12-11 DIAGNOSIS — Z86011 Personal history of benign neoplasm of the brain: Secondary | ICD-10-CM | POA: Diagnosis not present

## 2022-12-11 DIAGNOSIS — W06XXXA Fall from bed, initial encounter: Secondary | ICD-10-CM | POA: Diagnosis present

## 2022-12-11 DIAGNOSIS — D649 Anemia, unspecified: Secondary | ICD-10-CM | POA: Diagnosis not present

## 2022-12-11 DIAGNOSIS — E1140 Type 2 diabetes mellitus with diabetic neuropathy, unspecified: Secondary | ICD-10-CM | POA: Diagnosis present

## 2022-12-11 DIAGNOSIS — Z9181 History of falling: Secondary | ICD-10-CM

## 2022-12-11 DIAGNOSIS — R270 Ataxia, unspecified: Secondary | ICD-10-CM | POA: Diagnosis not present

## 2022-12-11 DIAGNOSIS — M6281 Muscle weakness (generalized): Secondary | ICD-10-CM | POA: Diagnosis not present

## 2022-12-11 DIAGNOSIS — R531 Weakness: Secondary | ICD-10-CM | POA: Diagnosis not present

## 2022-12-11 DIAGNOSIS — R9431 Abnormal electrocardiogram [ECG] [EKG]: Secondary | ICD-10-CM

## 2022-12-11 DIAGNOSIS — W19XXXD Unspecified fall, subsequent encounter: Secondary | ICD-10-CM | POA: Diagnosis not present

## 2022-12-11 DIAGNOSIS — Z881 Allergy status to other antibiotic agents status: Secondary | ICD-10-CM

## 2022-12-11 DIAGNOSIS — Y92013 Bedroom of single-family (private) house as the place of occurrence of the external cause: Secondary | ICD-10-CM | POA: Diagnosis not present

## 2022-12-11 DIAGNOSIS — Z833 Family history of diabetes mellitus: Secondary | ICD-10-CM

## 2022-12-11 DIAGNOSIS — I2510 Atherosclerotic heart disease of native coronary artery without angina pectoris: Secondary | ICD-10-CM | POA: Diagnosis not present

## 2022-12-11 DIAGNOSIS — Z7401 Bed confinement status: Secondary | ICD-10-CM | POA: Diagnosis not present

## 2022-12-11 DIAGNOSIS — N184 Chronic kidney disease, stage 4 (severe): Secondary | ICD-10-CM | POA: Diagnosis not present

## 2022-12-11 DIAGNOSIS — S72331A Displaced oblique fracture of shaft of right femur, initial encounter for closed fracture: Secondary | ICD-10-CM | POA: Diagnosis not present

## 2022-12-11 DIAGNOSIS — Z794 Long term (current) use of insulin: Secondary | ICD-10-CM

## 2022-12-11 DIAGNOSIS — S199XXA Unspecified injury of neck, initial encounter: Secondary | ICD-10-CM | POA: Diagnosis not present

## 2022-12-11 DIAGNOSIS — Z8673 Personal history of transient ischemic attack (TIA), and cerebral infarction without residual deficits: Secondary | ICD-10-CM

## 2022-12-11 DIAGNOSIS — R296 Repeated falls: Secondary | ICD-10-CM | POA: Diagnosis not present

## 2022-12-11 DIAGNOSIS — R0602 Shortness of breath: Secondary | ICD-10-CM | POA: Diagnosis not present

## 2022-12-11 DIAGNOSIS — I130 Hypertensive heart and chronic kidney disease with heart failure and stage 1 through stage 4 chronic kidney disease, or unspecified chronic kidney disease: Secondary | ICD-10-CM | POA: Diagnosis not present

## 2022-12-11 DIAGNOSIS — Z9049 Acquired absence of other specified parts of digestive tract: Secondary | ICD-10-CM

## 2022-12-11 DIAGNOSIS — Z885 Allergy status to narcotic agent status: Secondary | ICD-10-CM

## 2022-12-11 DIAGNOSIS — N189 Chronic kidney disease, unspecified: Secondary | ICD-10-CM | POA: Diagnosis present

## 2022-12-11 DIAGNOSIS — Z800 Family history of malignant neoplasm of digestive organs: Secondary | ICD-10-CM

## 2022-12-11 DIAGNOSIS — S7291XA Unspecified fracture of right femur, initial encounter for closed fracture: Secondary | ICD-10-CM | POA: Diagnosis not present

## 2022-12-11 DIAGNOSIS — Z87442 Personal history of urinary calculi: Secondary | ICD-10-CM

## 2022-12-11 DIAGNOSIS — I10 Essential (primary) hypertension: Secondary | ICD-10-CM | POA: Diagnosis present

## 2022-12-11 DIAGNOSIS — E1165 Type 2 diabetes mellitus with hyperglycemia: Secondary | ICD-10-CM | POA: Diagnosis not present

## 2022-12-11 DIAGNOSIS — T148XXA Other injury of unspecified body region, initial encounter: Secondary | ICD-10-CM | POA: Diagnosis present

## 2022-12-11 DIAGNOSIS — E782 Mixed hyperlipidemia: Secondary | ICD-10-CM | POA: Diagnosis present

## 2022-12-11 DIAGNOSIS — D62 Acute posthemorrhagic anemia: Secondary | ICD-10-CM | POA: Diagnosis not present

## 2022-12-11 DIAGNOSIS — Z9889 Other specified postprocedural states: Secondary | ICD-10-CM | POA: Diagnosis not present

## 2022-12-11 DIAGNOSIS — M79604 Pain in right leg: Secondary | ICD-10-CM | POA: Diagnosis not present

## 2022-12-11 DIAGNOSIS — E162 Hypoglycemia, unspecified: Secondary | ICD-10-CM | POA: Diagnosis not present

## 2022-12-11 DIAGNOSIS — E161 Other hypoglycemia: Secondary | ICD-10-CM | POA: Diagnosis not present

## 2022-12-11 DIAGNOSIS — M109 Gout, unspecified: Secondary | ICD-10-CM | POA: Diagnosis present

## 2022-12-11 DIAGNOSIS — D631 Anemia in chronic kidney disease: Secondary | ICD-10-CM | POA: Diagnosis not present

## 2022-12-11 DIAGNOSIS — S72451A Displaced supracondylar fracture without intracondylar extension of lower end of right femur, initial encounter for closed fracture: Principal | ICD-10-CM | POA: Diagnosis present

## 2022-12-11 DIAGNOSIS — M4322 Fusion of spine, cervical region: Secondary | ICD-10-CM | POA: Diagnosis not present

## 2022-12-11 DIAGNOSIS — S72041A Displaced fracture of base of neck of right femur, initial encounter for closed fracture: Principal | ICD-10-CM

## 2022-12-11 DIAGNOSIS — S79929A Unspecified injury of unspecified thigh, initial encounter: Secondary | ICD-10-CM | POA: Diagnosis not present

## 2022-12-11 DIAGNOSIS — S72001A Fracture of unspecified part of neck of right femur, initial encounter for closed fracture: Secondary | ICD-10-CM | POA: Diagnosis not present

## 2022-12-11 DIAGNOSIS — R0902 Hypoxemia: Secondary | ICD-10-CM | POA: Diagnosis not present

## 2022-12-11 DIAGNOSIS — I509 Heart failure, unspecified: Secondary | ICD-10-CM | POA: Diagnosis not present

## 2022-12-11 DIAGNOSIS — Z882 Allergy status to sulfonamides status: Secondary | ICD-10-CM

## 2022-12-11 DIAGNOSIS — G319 Degenerative disease of nervous system, unspecified: Secondary | ICD-10-CM | POA: Diagnosis not present

## 2022-12-11 DIAGNOSIS — Z79899 Other long term (current) drug therapy: Secondary | ICD-10-CM

## 2022-12-11 DIAGNOSIS — S0990XA Unspecified injury of head, initial encounter: Secondary | ICD-10-CM | POA: Diagnosis not present

## 2022-12-11 DIAGNOSIS — E875 Hyperkalemia: Secondary | ICD-10-CM | POA: Diagnosis present

## 2022-12-11 DIAGNOSIS — E1122 Type 2 diabetes mellitus with diabetic chronic kidney disease: Secondary | ICD-10-CM | POA: Diagnosis not present

## 2022-12-11 DIAGNOSIS — I2489 Other forms of acute ischemic heart disease: Secondary | ICD-10-CM | POA: Diagnosis not present

## 2022-12-11 DIAGNOSIS — S72491A Other fracture of lower end of right femur, initial encounter for closed fracture: Secondary | ICD-10-CM | POA: Diagnosis not present

## 2022-12-11 DIAGNOSIS — M25562 Pain in left knee: Secondary | ICD-10-CM | POA: Diagnosis not present

## 2022-12-11 DIAGNOSIS — R079 Chest pain, unspecified: Secondary | ICD-10-CM | POA: Diagnosis not present

## 2022-12-11 DIAGNOSIS — I5032 Chronic diastolic (congestive) heart failure: Secondary | ICD-10-CM | POA: Diagnosis not present

## 2022-12-11 DIAGNOSIS — S72332D Displaced oblique fracture of shaft of left femur, subsequent encounter for closed fracture with routine healing: Secondary | ICD-10-CM | POA: Diagnosis not present

## 2022-12-11 DIAGNOSIS — S72401A Unspecified fracture of lower end of right femur, initial encounter for closed fracture: Secondary | ICD-10-CM | POA: Diagnosis not present

## 2022-12-11 DIAGNOSIS — Z886 Allergy status to analgesic agent status: Secondary | ICD-10-CM

## 2022-12-11 DIAGNOSIS — E1143 Type 2 diabetes mellitus with diabetic autonomic (poly)neuropathy: Secondary | ICD-10-CM | POA: Diagnosis not present

## 2022-12-11 DIAGNOSIS — K5090 Crohn's disease, unspecified, without complications: Secondary | ICD-10-CM | POA: Diagnosis not present

## 2022-12-11 DIAGNOSIS — S7290XA Unspecified fracture of unspecified femur, initial encounter for closed fracture: Secondary | ICD-10-CM | POA: Diagnosis not present

## 2022-12-11 DIAGNOSIS — Z4789 Encounter for other orthopedic aftercare: Secondary | ICD-10-CM | POA: Diagnosis not present

## 2022-12-11 DIAGNOSIS — I110 Hypertensive heart disease with heart failure: Secondary | ICD-10-CM | POA: Diagnosis not present

## 2022-12-11 DIAGNOSIS — Z888 Allergy status to other drugs, medicaments and biological substances status: Secondary | ICD-10-CM

## 2022-12-11 DIAGNOSIS — R41841 Cognitive communication deficit: Secondary | ICD-10-CM | POA: Diagnosis not present

## 2022-12-11 LAB — CBC WITH DIFFERENTIAL/PLATELET
Abs Immature Granulocytes: 0.5 10*3/uL — ABNORMAL HIGH (ref 0.00–0.07)
Basophils Absolute: 0.1 10*3/uL (ref 0.0–0.1)
Basophils Relative: 1 %
Eosinophils Absolute: 0.1 10*3/uL (ref 0.0–0.5)
Eosinophils Relative: 1 %
HCT: 24.9 % — ABNORMAL LOW (ref 36.0–46.0)
Hemoglobin: 7.8 g/dL — ABNORMAL LOW (ref 12.0–15.0)
Immature Granulocytes: 5 %
Lymphocytes Relative: 6 %
Lymphs Abs: 0.6 10*3/uL — ABNORMAL LOW (ref 0.7–4.0)
MCH: 30.6 pg (ref 26.0–34.0)
MCHC: 31.3 g/dL (ref 30.0–36.0)
MCV: 97.6 fL (ref 80.0–100.0)
Monocytes Absolute: 0.3 10*3/uL (ref 0.1–1.0)
Monocytes Relative: 4 %
Neutro Abs: 7.9 10*3/uL — ABNORMAL HIGH (ref 1.7–7.7)
Neutrophils Relative %: 83 %
Platelets: 255 10*3/uL (ref 150–400)
RBC: 2.55 MIL/uL — ABNORMAL LOW (ref 3.87–5.11)
RDW: 17.3 % — ABNORMAL HIGH (ref 11.5–15.5)
WBC: 9.5 10*3/uL (ref 4.0–10.5)
nRBC: 0 % (ref 0.0–0.2)

## 2022-12-11 LAB — PROTIME-INR
INR: 1.1 (ref 0.8–1.2)
Prothrombin Time: 13.8 seconds (ref 11.4–15.2)

## 2022-12-11 LAB — BASIC METABOLIC PANEL
Anion gap: 11 (ref 5–15)
BUN: 57 mg/dL — ABNORMAL HIGH (ref 8–23)
CO2: 16 mmol/L — ABNORMAL LOW (ref 22–32)
Calcium: 9 mg/dL (ref 8.9–10.3)
Chloride: 109 mmol/L (ref 98–111)
Creatinine, Ser: 2.12 mg/dL — ABNORMAL HIGH (ref 0.44–1.00)
GFR, Estimated: 25 mL/min — ABNORMAL LOW (ref >60)
Glucose, Bld: 211 mg/dL — ABNORMAL HIGH (ref 70–99)
Potassium: 5.5 mmol/L — ABNORMAL HIGH (ref 3.5–5.1)
Sodium: 136 mmol/L (ref 135–145)

## 2022-12-11 LAB — PREPARE RBC (CROSSMATCH)

## 2022-12-11 LAB — CBG MONITORING, ED: Glucose-Capillary: 181 mg/dL — ABNORMAL HIGH (ref 70–99)

## 2022-12-11 MED ORDER — SODIUM CHLORIDE 0.9% IV SOLUTION: Freq: Once | INTRAVENOUS | Status: AC

## 2022-12-11 MED ORDER — FENTANYL CITRATE PF 50 MCG/ML IJ SOSY
50.0000 ug | PREFILLED_SYRINGE | INTRAMUSCULAR | Status: DC | PRN
  Administered 2022-12-11: 50 ug via INTRAVENOUS
  Filled 2022-12-11: qty 1

## 2022-12-11 NOTE — Assessment & Plan Note (Addendum)
2/2 mechanical fall, patient rolled out of bed unto the floor.  Femur xray- Showing displaced oblique fracture of the distal right femur.  Previous, gamma nail placement for treatment of intertrochanteric fracture. - EDP talked to Dr. Kathaleen Bury, admit to [facility], apply knee immobilizer, plan for surgery tomorrow. - EKG- pre-op - NPO midnight -Reports urinary symptoms, check UA - N/s 75cc/hr x 12hr

## 2022-12-11 NOTE — Assessment & Plan Note (Addendum)
Hemoglobin 7.8, baseline 8-10.  Slight drop below baseline. -Trend for now

## 2022-12-11 NOTE — ED Triage Notes (Signed)
Pt fell at home; pt states unsure why she fell, pt complains of right knee pain, EMS states CBGs upon arrival were in the 68s; EMS gave glucagon and increased to 157; pt denies hitting head, pt states she does not take any blood thinners.

## 2022-12-11 NOTE — H&P (Signed)
History and Physical    Heather [name] [id number] DOB: 1951/06/11 DOA: 12/11/2022  PCP: [name], MD   Patient coming from: Home  I have personally briefly reviewed patient's old medical records in [place]  Chief Complaint: Fall  HPI: Heather [name] is a 72 y.o. female with medical history significant for diabetes mellitus, hypertension, CKD 4, diastolic CHF. Patient presented to the ED with complaints of fall.  Patient reports history of frequent falls.  She is supposed to use a walker for ambulation.  Today she rolled out of the bed and fell on the floor.  She was unable to get up and was complaining right lower extremity pain.  She reports some urinary symptoms over the past few days, she otherwise denies difficulty breathing, no chest pain.  Recent hospitalization 1/14 - 1/17- for HHS, with right humeral neck fracture -likely new fracture superimposed on subacute to chronic fracture  ED Course: Stable vitals.  Xray of right femur-displaced oblique fracture of the distal femoral at the diaphyseal metaphyseal junction.  Chest x-ray clear. Head and Cervical Ct negative for acute abnormality. EDP to Dr. Kathaleen Bury, recommended admission to [facility] likely surgery tomorrow.  Review of Systems: As per HPI all other systems reviewed and negative.  Past Medical History:  Diagnosis Date   Acute renal failure superimposed on stage 3 chronic kidney disease ([place]) 01/21/2016   Anemia    Anxiety    B12 deficiency    Brain tumor (benign) ([place]) 10/29/2020   Cellulitis and abscess    Abdomen and buttocks   Chronic diastolic CHF (congestive heart failure) ([place]) 03/16/2022   CKD (chronic kidney disease) stage 3, GFR 30-59 ml/min (HCC)    Closed fracture of distal end of femur, unspecified fracture morphology, initial encounter ([place])    Coronary arteriosclerosis 07/15/2018   Crohn disease ([place])    Depression    Diabetic neuropathy (HCC)    feet   DJD (degenerative  joint disease)    Right forminal stenosis C4-5   Essential hypertension    Femur fracture, right ([place]) 123456   Folliculitis    GERD (gastroesophageal reflux disease)    Gout    Headache(784.0)    Hiatal hernia    History of blood transfusion    History of bronchitis    History of cardiac catheterization    No significant CAD 2012   History of kidney stones    surgery to remove   Insomnia    Irritable bowel syndrome    Mixed hyperlipidemia    Nausea & vomiting 12/22/2014   Osteoarthritis    Palpitations    Pneumonia    Reflux esophagitis    Salmonella    Stroke [facility])    Tinnitus    Right   Type 2 diabetes mellitus ([place])     Past Surgical History:  Procedure Laterality Date   BACK SURGERY     c-spine surgery     03/2007   CARDIAC CATHETERIZATION     CHOLECYSTECTOMY     COLONOSCOPY  04/21/2011; 05/29/11   6/12: [place] - ?AVM at IC valve, inflammatory changes at IC valve [facility]); 7/12 - [facility]; IC valve erosions, look chronic and probably Crohn's per path   colonoscopy  2017   [facility]: random biopsies normal. TI normal   ESOPHAGOGASTRODUODENOSCOPY  05/29/11   normal   ESOPHAGOGASTRODUODENOSCOPY N/A 01/21/2016   Dr. Michail Sermon: normal   EYE SURGERY Bilateral    cataracts removed   FEMUR IM NAIL Right  04/10/2018   Procedure: INTRAMEDULLARY (IM) NAIL FEMORAL;  Surgeon: Paralee Cancel, MD;  Location: [facility];  Service: Orthopedics;  Laterality: Right;   FRACTURE SURGERY     GIVENS CAPSULE STUDY  11/17/2020   HARDWARE REMOVAL Right 04/10/2018   Procedure: HARDWARE REMOVAL RIGHT DISTAL FEMUR;  Surgeon: Paralee Cancel, MD;  Location: [facility];  Service: Orthopedics;  Laterality: Right;   HERNIA REPAIR     IR FLUORO GUIDE CV LINE RIGHT  04/01/2017   IR US GUIDE VASC ACCESS RIGHT  04/01/2017   JOINT REPLACEMENT Right    hip   OPEN REDUCTION INTERNAL FIXATION (ORIF) DISTAL RADIAL FRACTURE Right 05/28/2019   Procedure: OPEN REDUCTION INTERNAL FIXATION (ORIF) DISTAL  RADIAL FRACTURE;  Surgeon: Verner Mould, MD;  Location: [place];  Service: Orthopedics;  Laterality: Right;  with block 90 minutes   ORIF FEMUR FRACTURE Right 03/31/2017   Procedure: OPEN REDUCTION INTERNAL FIXATION (ORIF) DISTAL FEMUR FRACTURE;  Surgeon: Paralee Cancel, MD;  Location: [place];  Service: Orthopedics;  Laterality: Right;   removal of kidney stone     Right knee arthroscopic surgery     TONSILLECTOMY     TOTAL ABDOMINAL HYSTERECTOMY       reports that she has never smoked. She has never used smokeless tobacco. She reports that she does not drink alcohol and does not use drugs.  Allergies  Allergen Reactions   Cozaar [Losartan] Nausea And Vomiting, Swelling and Rash   Quinolones Itching, Nausea And Vomiting, Nausea Only and Swelling    Swelling of face, jaw, and lips    Cymbalta [Duloxetine Hcl] Itching and Swelling   Sulfa Antibiotics Nausea And Vomiting and Swelling    Headache  Swelling of feet, legs    Synalar [Fluocinolone] Swelling   Cipro [Ciprofloxacin Hcl] Nausea And Vomiting and Swelling    Swelling of face, jaw, lips   Diovan [Valsartan] Nausea And Vomiting and Swelling   Glucophage [Metformin] Other (See Comments)    GI upset   Ketalar [Ketamine] Swelling   Norco [Hydrocodone-Acetaminophen] Itching and Swelling   Norvasc [Amlodipine Besylate] Itching and Swelling    Fluid retention    Nsaids Nausea And Vomiting and Other (See Comments)    Has bleeding ulcers   Tape Itching and Rash    Paper tape can only be used on this patient   Zestril [Lisinopril] Swelling and Rash    Oral swelling Red streaks on arms/legs    Family History  Problem Relation Age of Onset   Diabetes Mother    Colon cancer Brother        POSSIBLY. Patient states diagnosed at age 42, then later states age 52. unclear and limited historian   Esophageal cancer Neg Hx    Rectal cancer Neg Hx    Stomach cancer Neg Hx     Prior to Admission medications   Medication Sig  Start Date End Date Taking? Authorizing Provider  acetaminophen (TYLENOL) 325 MG tablet Take 2 tablets (650 mg total) by mouth every 4 (four) hours as needed for mild pain, moderate pain, fever or headache. 11/14/22  Yes Johnson, Clanford L, MD  allopurinol (ZYLOPRIM) 100 MG tablet Take 100 mg by mouth daily. 03/20/21  Yes [provider]  atorvastatin (LIPITOR) 80 MG tablet Take 1 tablet (80 mg total) by mouth daily. Restart on 06/29/22 06/22/22  Yes Tat, Shanon Brow, MD  gabapentin (NEURONTIN) 300 MG capsule Take 1 capsule by mouth at bedtime.   Yes [provider]  hydrALAZINE (APRESOLINE) 50 MG tablet Take 1 tablet (50 mg total) by mouth every 8 (eight) hours. 10/21/22 12/11/22 Yes Shah, Pratik D, DO  insulin glargine-yfgn (SEMGLEE) 100 UNIT/ML Pen INJECT 15 UNITS UNDER THE SKIN ONCE DAILY   Yes [provider]  levETIRAcetam (KEPPRA) 500 MG tablet Take 500 mg by mouth 2 (two) times daily.   Yes [provider]  lipase/protease/amylase (CREON) 36000 UNITS CPEP capsule Take 36,000 Units by mouth 3 (three) times daily with meals.   Yes [provider]  methocarbamol (ROBAXIN) 500 MG tablet Take 1 tablet (500 mg total) by mouth every 8 (eight) hours as needed for muscle spasms. 11/14/22  Yes Johnson, Clanford L, MD  metoprolol succinate (TOPROL-XL) 25 MG 24 hr tablet Take 1.5 tablets (37.5 mg total) by mouth daily. 11/14/22 12/14/22 Yes Johnson, Clanford L, MD  naloxone [facility]) nasal spray 4 mg/0.1 mL Place 1 spray into the nose once.   Yes [provider]  naproxen sodium (ALEVE) 220 MG tablet Take 440 mg by mouth daily as needed (pain).   Yes [provider]  traMADol (ULTRAM) 50 MG tablet Take 50 mg by mouth 2 (two) times daily.   Yes [provider]  venlafaxine XR (EFFEXOR-XR) 150 MG 24 hr capsule Take 150 mg by mouth daily. 07/11/22  Yes [provider]  diphenhydrAMINE (BENADRYL) 2 % cream Apply topically 3 (three) times daily  as needed for itching. Patient not taking: Reported on 12/11/2022 12/01/21   Murlean Iba, MD  furosemide (LASIX) 40 MG tablet TAKE 1 TABLET BY MOUTH ONCE DAILY ALONG WITH 20MG TABLET FOR INCREASED FLUID RETENTION Patient not taking: Reported on 12/11/2022    [provider]  insulin aspart (NOVOLOG) 100 UNIT/ML FlexPen Inject 5 Units into the skin 3 (three) times daily with meals. Give only if eats 50% or more of meal. Patient taking differently: Inject 10 Units into the skin daily. Give only if eats 50% or more of meal. 11/14/22   Johnson, Clanford L, MD  polyethylene glycol (MIRALAX / GLYCOLAX) 17 g packet Take 17 g by mouth daily. Patient not taking: Reported on 12/11/2022 11/15/22   Murlean Iba, MD    Physical Exam: Vitals:   12/11/22 2033 12/11/22 2045 12/11/22 2100 12/11/22 2130  BP: (!) 144/68  (!) 142/74 (!) 143/59  Pulse: 78 77 80 79  Resp: 11 13 (!) 25 17  Temp: 97.8 F (36.6 C)     TempSrc: Axillary     SpO2: 100% 98% 99% 100%  Weight:      Height:        Constitutional: NAD, calm, comfortable Vitals:   12/11/22 2033 12/11/22 2045 12/11/22 2100 12/11/22 2130  BP: (!) 144/68  (!) 142/74 (!) 143/59  Pulse: 78 77 80 79  Resp: 11 13 (!) 25 17  Temp: 97.8 F (36.6 C)     TempSrc: Axillary     SpO2: 100% 98% 99% 100%  Weight:      Height:       Eyes: PERRL, lids and conjunctivae normal ENMT: Mucous membranes are moist.   Neck: normal, supple, no masses, no thyromegaly Respiratory: clear to auscultation bilaterally, no wheezing, no crackles. Normal respiratory effort. No accessory muscle use.  Cardiovascular: Regular rate and rhythm, no murmurs / rubs / gallops. No extremity edema.  Abdomen: no tenderness, no masses palpated. No hepatosplenomegaly. Bowel sounds positive.  Musculoskeletal: no clubbing / cyanosis. Deformity to lateral aspect of left lower femur  Skin:  no rashes, lesions, ulcers. No induration Neurologic: No apparent cranial nerve  abnormality, moving extremities spontaneously- right lower extremity not tested. Psychiatric: Sleepy but arousable, s/p fentanyl, able to answer questions oriented to person, place and situation.  Labs on Admission: I have personally reviewed following labs and imaging studies  CBC: Recent Labs  Lab 12/11/22 1737  WBC 9.5  NEUTROABS 7.9*  HGB 7.8*  HCT 24.9*  MCV 97.6  PLT 123456   Basic Metabolic Panel: Recent Labs  Lab 12/11/22 1737  NA 136  K 5.5*  CL 109  CO2 16*  GLUCOSE 211*  BUN 57*  CREATININE 2.12*  CALCIUM 9.0   Coagulation Profile: Recent Labs  Lab 12/11/22 1737  INR 1.1   CBG: Recent Labs  Lab 12/11/22 1758  GLUCAP 181*   Radiological Exams on Admission: CT Cervical Spine Wo Contrast  Result Date: 12/11/2022 CLINICAL DATA:  Blunt trauma due to a fall. EXAM: CT CERVICAL SPINE WITHOUT CONTRAST TECHNIQUE: Multidetector CT imaging of the cervical spine was performed without intravenous contrast. Multiplanar CT image reconstructions were also generated. RADIATION DOSE REDUCTION: This exam was performed according to the departmental dose-optimization program which includes automated exposure control, adjustment of the mA and/or kV according to patient size and/or use of iterative reconstruction technique. COMPARISON:  11/11/2022 FINDINGS: Alignment: Normal alignment. Skull base and vertebrae: Skull base appears intact. No vertebral compression deformities. No focal bone lesion or bone destruction. Soft tissues and spinal canal: No prevertebral soft tissue swelling. No abnormal paraspinal soft tissue mass or infiltration. Vascular calcifications. Disc levels: Postoperative changes with anterior plate and screw fixation at C3-4 and C7-T1. Posterior rod and screw fixation at C7-T1. Intervertebral disc fusion at C3-4, C4-5, C5-6, C6-7, and C7-T1 levels. Degenerative changes throughout the posterior facet joints. Upper chest: Lung apices are clear. Other: Appearances are  similar to prior study. IMPRESSION: 1. Normal alignment.  No acute displaced fractures are identified. 2. Postoperative changes with intervertebral disc fusion throughout the cervical spine and internal fixation as discussed above. Electronically Signed   By: Lucienne Capers M.D.   On: 12/11/2022 18:03   CT Head Wo Contrast  Result Date: 12/11/2022 CLINICAL DATA:  Ataxia.  Head trauma.  Patient fell at home. EXAM: CT HEAD WITHOUT CONTRAST TECHNIQUE: Contiguous axial images were obtained from the base of the skull through the vertex without intravenous contrast. RADIATION DOSE REDUCTION: This exam was performed according to the departmental dose-optimization program which includes automated exposure control, adjustment of the mA and/or kV according to patient size and/or use of iterative reconstruction technique. COMPARISON:  CT head 11/11/2022.  MRI brain 08/23/2022 FINDINGS: Brain: Mild diffuse cerebral atrophy. Low-attenuation changes in the deep white matter consistent small vessel ischemia. No mass-effect or midline shift. No abnormal extra-axial fluid collections. Gray-white matter junctions are mostly distinct. Basal cisterns are not effaced. No acute intracranial hemorrhage. Vascular: Diffuse vascular calcification. Skull: Calvarium appears intact. Sinuses/Orbits: Motion artifact limits evaluation but paranasal sinuses and mastoid air cells appear clear. Other: None. IMPRESSION: No acute intracranial abnormalities. Chronic atrophy and small vessel ischemic changes. Electronically Signed   By: Lucienne Capers M.D.   On: 12/11/2022 17:59   DG FEMUR, MIN 2 VIEWS RIGHT  Result Date: 12/11/2022 CLINICAL DATA:  [place] today with pain EXAM: RIGHT FEMUR 2 VIEWS COMPARISON:  11/28/2021 FINDINGS: No change. Previous gamma nail placement for treatment of distal intertrochanteric fracture. No evidence of recent injury in that area. No advanced osteoarthritis of the right hip. Acute displaced oblique fracture  of  the distal femur at the diaphyseal metaphyseal junction. IMPRESSION: Displaced oblique fracture of the distal femur at the diaphyseal metaphyseal junction. Previous gamma nail placement for treatment of intertrochanteric fracture. Electronically Signed   By: Nelson Chimes M.D.   On: 12/11/2022 17:21   DG Chest Port 1 View  Result Date: 12/11/2022 CLINICAL DATA:  Hip fracture. EXAM: PORTABLE CHEST 1 VIEW COMPARISON:  08/22/2022 FINDINGS: The cardiac silhouette, mediastinal hilar contours are within normal limits. The lungs are clear. No pleural effusions or pneumothorax. Remote trauma involving the right shoulder. Cervical fusion hardware noted. IMPRESSION: No acute cardiopulmonary findings. Electronically Signed   By: Marijo Sanes M.D.   On: 12/11/2022 17:16    EKG: Independently reviewed.   Assessment/Plan Principal Problem:   Fracture Active Problems:   Recurrent falls   Uncontrolled type 2 diabetes mellitus with hyperglycemia, with long-term current use of insulin (HCC)   Hypertension   Chronic diastolic CHF (congestive heart failure) (HCC)   Chronic kidney disease (CKD), stage IV (severe) (HCC)   Anemia due to chronic kidney disease   Assessment and Plan: * Fracture 2/2 mechanical fall, patient rolled out of bed unto the floor.  Femur xray- Showing displaced oblique fracture of the distal right femur.  Previous, gamma nail placement for treatment of intertrochanteric fracture. - EDP talked to Dr. Kathaleen Bury, admit to [facility], apply knee immobilizer, plan for surgery tomorrow. - EKG- pre-op - NPO midnight -Reports urinary symptoms, check UA - N/s 75cc/hr x 12hr  Uncontrolled type 2 diabetes mellitus with hyperglycemia, with long-term current use of insulin (HCC) Uncontrolled.  A1c > 15. - SSI- M q4h -Hold Lantus 15 units daily for now  Hypertension Stable. -Resume metoprolol  Chronic diastolic CHF (congestive heart failure) (HCC) Stable and compensated.  Last echo 07/2021, EF of  60 to 65%.  Anemia due to chronic kidney disease Hemoglobin 7.8, baseline 8-10.  Slight drop below baseline. -Trend for now  Chronic kidney disease (CKD), stage IV (severe) (HCC) Creatinine 2.12, improved compared to prior.   DVT prophylaxis: SCDS Code Status: FULL Family Communication: Spouse at bedside Disposition Plan: ~ 2 days Consults called: Ortho Admission status: Inpt Meds Surg I certify that at the point of admission it is my clinical judgment that the patient will require inpatient hospital care spanning beyond 2 midnights from the point of admission due to high intensity of service, high risk for further deterioration and high frequency of surveillance required.   Author: Bethena Roys, MD 12/11/2022 10:08 PM  For on call review [email or web address].

## 2022-12-11 NOTE — Assessment & Plan Note (Signed)
Creatinine 2.12, improved compared to prior.

## 2022-12-11 NOTE — ED Notes (Signed)
Patient is resting comfortably. 

## 2022-12-11 NOTE — ED Notes (Signed)
3 nurses had unsuccessful attempts to start an IV. The previous IV infiltrated. MD aware at this time.

## 2022-12-11 NOTE — ED Notes (Signed)
Ok to remove c-collar

## 2022-12-11 NOTE — Assessment & Plan Note (Signed)
Stable and compensated.  Last echo 07/2021, EF of 60 to 65%.

## 2022-12-11 NOTE — ED Notes (Signed)
Removed C Collar per hospitalist in room.

## 2022-12-11 NOTE — ED Provider Notes (Signed)
[facility] Provider Note   CSN: [id number] Arrival date & time: 12/11/22  [id number]     History  Chief Complaint  Patient presents with   Heather [name]    Heather [name] is a 72 y.o. female.   Fall   Hold onto cement 72 year old female presenting with a fall, evidently the patient has had frequent falls.  She is elderly at 72 years old, she has had multiple admissions to the hospital over the last several months including for syncope, hyperkalemia and acute kidney injury and severe hyperglycemia most recently admitted about a month ago.  At that time she also had a nondisplaced fracture of her right upper extremity at the shoulder.  She states that she was getting out of bed, she had some kind of fall although she does not know why, evidently this is quite frequent and she was supposed to be seen tomorrow for the specific reason of imbalance and frequent falls.  Paramedics found the patient to be on the fall with a shortened externally rotated right lower extremity.  Not complaining of headache or neck pain.  No other complaints at this time.  Given 100 mcg of fentanyl prehospital    Home Medications Prior to Admission medications   Medication Sig Start Date End Date Taking? Authorizing Provider  acetaminophen (TYLENOL) 325 MG tablet Take 2 tablets (650 mg total) by mouth every 4 (four) hours as needed for mild pain, moderate pain, fever or headache. 11/14/22   Wynetta Emery, Clanford L, MD  allopurinol (ZYLOPRIM) 100 MG tablet Take 100 mg by mouth daily. 03/20/21   [provider]  atorvastatin (LIPITOR) 80 MG tablet Take 1 tablet (80 mg total) by mouth daily. Restart on 06/29/22 06/22/22   Orson Eva, MD  diphenhydrAMINE (BENADRYL) 2 % cream Apply topically 3 (three) times daily as needed for itching. 12/01/21   Johnson, Clanford L, MD  esomeprazole (NEXIUM) 40 MG capsule Take 40 mg by mouth daily before breakfast.      [provider]   hydrALAZINE (APRESOLINE) 50 MG tablet Take 1 tablet (50 mg total) by mouth every 8 (eight) hours. 10/21/22 11/20/22  Manuella Ghazi, Pratik D, DO  insulin aspart (NOVOLOG) 100 UNIT/ML FlexPen Inject 5 Units into the skin 3 (three) times daily with meals. Give only if eats 50% or more of meal. 11/14/22   Johnson, Clanford L, MD  insulin detemir (LEVEMIR FLEXPEN) 100 UNIT/ML FlexPen Inject 15 Units into the skin at bedtime. 11/14/22   Johnson, Clanford L, MD  levETIRAcetam (KEPPRA) 500 MG tablet Take 500 mg by mouth 2 (two) times daily.    [provider]  linagliptin (TRADJENTA) 5 MG TABS tablet Take 1 tablet (5 mg total) by mouth daily. 11/14/22   Johnson, Clanford L, MD  lipase/protease/amylase (CREON) 36000 UNITS CPEP capsule Take 36,000 Units by mouth 3 (three) times daily with meals.    [provider]  methocarbamol (ROBAXIN) 500 MG tablet Take 1 tablet (500 mg total) by mouth every 8 (eight) hours as needed for muscle spasms. 11/14/22   Johnson, Clanford L, MD  metoprolol succinate (TOPROL-XL) 25 MG 24 hr tablet Take 1.5 tablets (37.5 mg total) by mouth daily. 11/14/22 12/14/22  Johnson, Clanford L, MD  polyethylene glycol (MIRALAX / GLYCOLAX) 17 g packet Take 17 g by mouth daily. 11/15/22   Johnson, Clanford L, MD  venlafaxine XR (EFFEXOR-XR) 150 MG 24 hr capsule Take 150 mg by mouth daily. 07/11/22   [provider]      Allergies    Cozaar [losartan], Quinolones, Cymbalta [duloxetine hcl], Sulfa antibiotics, Synalar [fluocinolone], Cipro [ciprofloxacin hcl], Diovan [valsartan], Glucophage [metformin], Ketalar [ketamine], Norco [hydrocodone-acetaminophen], Norvasc [amlodipine besylate], Nsaids, Tape, and Zestril [lisinopril]    Review of Systems   Review of Systems  All other systems reviewed and are negative.   Physical Exam Updated Vital Signs Ht 1.626 m (5' 4"$ )   Wt 51.7 kg   LMP  (LMP Unknown)   BMI 19.57 kg/m  Physical Exam Vitals and nursing note reviewed.   Constitutional:      General: She is not in acute distress.    Appearance: She is well-developed.  HENT:     Head: Normocephalic and atraumatic.     Mouth/Throat:     Pharynx: No oropharyngeal exudate.  Eyes:     General: No scleral icterus.       Right eye: No discharge.        Left eye: No discharge.     Conjunctiva/sclera: Conjunctivae normal.     Pupils: Pupils are equal, round, and reactive to light.  Neck:     Thyroid: No thyromegaly.     Vascular: No JVD.  Cardiovascular:     Rate and Rhythm: Normal rate and regular rhythm.     Heart sounds: Normal heart sounds. No murmur heard.    No friction rub. No gallop.  Pulmonary:     Effort: Pulmonary effort is normal. No respiratory distress.     Breath sounds: Normal breath sounds. No wheezing or rales.  Abdominal:     General: Bowel sounds are normal. There is no distension.     Palpations: Abdomen is soft. There is no mass.     Tenderness: There is no abdominal tenderness.  Musculoskeletal:        General: Tenderness and deformity present.     Cervical back: Normal range of motion and neck supple.     Right lower leg: No edema.     Left lower leg: No edema.     Comments: Right lower extremity is shortened and externally rotated, there is pain with rotation of the hip, pulses are normal at the foot  Lymphadenopathy:     Cervical: No cervical adenopathy.  Skin:    General: Skin is warm and dry.     Findings: No erythema or rash.  Neurological:     Mental Status: She is alert.     Coordination: Coordination normal.     Comments: Able to follow commands except for the right lower extremity which is in pain secondary to deformity  Psychiatric:        Behavior: Behavior normal.     ED Results / Procedures / Treatments   Labs (all labs ordered are listed, but only abnormal results are displayed) Labs Reviewed  CBC WITH DIFFERENTIAL/PLATELET - Abnormal; Notable for the following components:      Result Value   RBC  2.55 (*)    Hemoglobin 7.8 (*)    HCT 24.9 (*)    RDW 17.3 (*)    Neutro Abs 7.9 (*)    Lymphs Abs 0.6 (*)    Abs Immature Granulocytes 0.50 (*)    All other components within normal limits  BASIC METABOLIC PANEL - Abnormal; Notable for the following components:   Potassium 5.5 (*)    CO2 16 (*)    Glucose, Bld 211 (*)    BUN 57 (*)    Creatinine, Ser 2.12 (*)  GFR, Estimated 25 (*)    All other components within normal limits  CBG MONITORING, ED - Abnormal; Notable for the following components:   Glucose-Capillary 181 (*)    All other components within normal limits  PROTIME-INR  POC OCCULT BLOOD, ED  TYPE AND SCREEN  PREPARE RBC (CROSSMATCH)    EKG None  Radiology CT Cervical Spine Wo Contrast  Result Date: 12/11/2022 CLINICAL DATA:  Blunt trauma due to a fall. EXAM: CT CERVICAL SPINE WITHOUT CONTRAST TECHNIQUE: Multidetector CT imaging of the cervical spine was performed without intravenous contrast. Multiplanar CT image reconstructions were also generated. RADIATION DOSE REDUCTION: This exam was performed according to the departmental dose-optimization program which includes automated exposure control, adjustment of the mA and/or kV according to patient size and/or use of iterative reconstruction technique. COMPARISON:  11/11/2022 FINDINGS: Alignment: Normal alignment. Skull base and vertebrae: Skull base appears intact. No vertebral compression deformities. No focal bone lesion or bone destruction. Soft tissues and spinal canal: No prevertebral soft tissue swelling. No abnormal paraspinal soft tissue mass or infiltration. Vascular calcifications. Disc levels: Postoperative changes with anterior plate and screw fixation at C3-4 and C7-T1. Posterior rod and screw fixation at C7-T1. Intervertebral disc fusion at C3-4, C4-5, C5-6, C6-7, and C7-T1 levels. Degenerative changes throughout the posterior facet joints. Upper chest: Lung apices are clear. Other: Appearances are similar to  prior study. IMPRESSION: 1. Normal alignment.  No acute displaced fractures are identified. 2. Postoperative changes with intervertebral disc fusion throughout the cervical spine and internal fixation as discussed above. Electronically Signed   By: Lucienne Capers M.D.   On: 12/11/2022 18:03   CT Head Wo Contrast  Result Date: 12/11/2022 CLINICAL DATA:  Ataxia.  Head trauma.  Patient fell at home. EXAM: CT HEAD WITHOUT CONTRAST TECHNIQUE: Contiguous axial images were obtained from the base of the skull through the vertex without intravenous contrast. RADIATION DOSE REDUCTION: This exam was performed according to the departmental dose-optimization program which includes automated exposure control, adjustment of the mA and/or kV according to patient size and/or use of iterative reconstruction technique. COMPARISON:  CT head 11/11/2022.  MRI brain 08/23/2022 FINDINGS: Brain: Mild diffuse cerebral atrophy. Low-attenuation changes in the deep white matter consistent small vessel ischemia. No mass-effect or midline shift. No abnormal extra-axial fluid collections. Gray-white matter junctions are mostly distinct. Basal cisterns are not effaced. No acute intracranial hemorrhage. Vascular: Diffuse vascular calcification. Skull: Calvarium appears intact. Sinuses/Orbits: Motion artifact limits evaluation but paranasal sinuses and mastoid air cells appear clear. Other: None. IMPRESSION: No acute intracranial abnormalities. Chronic atrophy and small vessel ischemic changes. Electronically Signed   By: Lucienne Capers M.D.   On: 12/11/2022 17:59   DG FEMUR, MIN 2 VIEWS RIGHT  Result Date: 12/11/2022 CLINICAL DATA:  [place] today with pain EXAM: RIGHT FEMUR 2 VIEWS COMPARISON:  11/28/2021 FINDINGS: No change. Previous gamma nail placement for treatment of distal intertrochanteric fracture. No evidence of recent injury in that area. No advanced osteoarthritis of the right hip. Acute displaced oblique fracture of the distal  femur at the diaphyseal metaphyseal junction. IMPRESSION: Displaced oblique fracture of the distal femur at the diaphyseal metaphyseal junction. Previous gamma nail placement for treatment of intertrochanteric fracture. Electronically Signed   By: Nelson Chimes M.D.   On: 12/11/2022 17:21   DG Chest Port 1 View  Result Date: 12/11/2022 CLINICAL DATA:  Hip fracture. EXAM: PORTABLE CHEST 1 VIEW COMPARISON:  08/22/2022 FINDINGS: The cardiac silhouette, mediastinal hilar contours are within normal limits. The  lungs are clear. No pleural effusions or pneumothorax. Remote trauma involving the right shoulder. Cervical fusion hardware noted. IMPRESSION: No acute cardiopulmonary findings. Electronically Signed   By: Marijo Sanes M.D.   On: 12/11/2022 17:16    Procedures Procedures    Medications Ordered in ED Medications  fentaNYL (SUBLIMAZE) injection 50 mcg (50 mcg Intravenous Given 12/11/22 1759)  0.9 %  sodium chloride infusion (Manually program via Guardrails IV Fluids) (has no administration in time range)    ED Course/ Medical Decision Making/ A&P                             Medical Decision Making Amount and/or Complexity of Data Reviewed Labs: ordered. Radiology: ordered.  Risk Prescription drug management.   This patient presents to the ED for concern of right hip fracture, this involves an extensive number of treatment options, and is a complaint that carries with it a high risk of complications and morbidity.  The differential diagnosis includes fracture, dislocation, pelvic fracture, frequent falls, head injury   Co morbidities that complicate the patient evaluation  Frequent falls, imbalance   Additional history obtained:  Additional history obtained from recent admission to the hospital, external medical record External records from outside source obtained and reviewed including recent admission notes   Lab Tests:  I Ordered, and personally interpreted labs.  The  pertinent results include: Creatinine of 2.1, potassium of 5.5, hemoglobin of 7.8, baseline is 10.9 but was 8.3 on 16 January.   Imaging Studies ordered:  I ordered imaging studies including x-ray of the right femur shows a distal periprosthetic fracture of the right femur I independently visualized and interpreted imaging which showed fracture I agree with the radiologist interpretation   Cardiac Monitoring: / EKG:  The patient was maintained on a cardiac monitor.  I personally viewed and interpreted the cardiac monitored which showed an underlying rhythm of: Sinus   Consultations Obtained:  I requested consultation with the orthopedist Dr. Kathaleen Bury, will consult with hospitalist for admission and transfer to [facility] for surgery tomorrow at orthopedic request,  and discussed lab and imaging findings as well as pertinent plan - they recommend: transfer to [facility] - hospitalist service   Problem List / ED Course / Critical interventions / Medication management  Will apply knee immobilizer to fracture, pain control given I ordered medication including fentanyl for pain, 1 unit of blood requested by orthopedics will be ordered as she is anemic going into surgery Reevaluation of the patient after these medicines showed that the patient stable I have reviewed the patients home medicines and have made adjustments as needed   Social Determinants of Health:  Elderly, fall, periprosthetic fracture   Test / Admission - Considered:  Will admit and transfer to [facility]         Final Clinical Impression(s) / ED Diagnoses Final diagnoses:  Closed displaced basicervical fracture of right femur, initial encounter ([place])  Anemia, unspecified type    Rx / DC Orders ED Discharge Orders     None         Noemi Chapel, MD 12/11/22 [phone]

## 2022-12-11 NOTE — Assessment & Plan Note (Signed)
Stable. -Resume metoprolol 

## 2022-12-11 NOTE — ED Notes (Signed)
Husband left. Would like notification of pt arrival at [facility].

## 2022-12-11 NOTE — Assessment & Plan Note (Signed)
Uncontrolled.  A1c > 15. - SSI- M q4h -Hold Lantus 15 units daily for now

## 2022-12-12 ENCOUNTER — Encounter (HOSPITAL_COMMUNITY)
Admission: EM | Disposition: A | Discharge status: Skilled Nursing Facility | Payer: Self-pay | Source: Home / Self Care | Attending: Internal Medicine

## 2022-12-12 ENCOUNTER — Encounter (HOSPITAL_COMMUNITY): Payer: Self-pay | Admitting: Anesthesiology

## 2022-12-12 ENCOUNTER — Encounter (HOSPITAL_COMMUNITY): Payer: Self-pay | Admitting: Internal Medicine

## 2022-12-12 ENCOUNTER — Inpatient Hospital Stay (HOSPITAL_COMMUNITY): Payer: Medicare PPO

## 2022-12-12 DIAGNOSIS — T148XXA Other injury of unspecified body region, initial encounter: Secondary | ICD-10-CM | POA: Diagnosis not present

## 2022-12-12 LAB — GLUCOSE, CAPILLARY
Glucose-Capillary: 104 mg/dL — ABNORMAL HIGH (ref 70–99)
Glucose-Capillary: 66 mg/dL — ABNORMAL LOW (ref 70–99)
Glucose-Capillary: 108 mg/dL — ABNORMAL HIGH (ref 70–99)
Glucose-Capillary: 156 mg/dL — ABNORMAL HIGH (ref 70–99)
Glucose-Capillary: 91 mg/dL (ref 70–99)
Glucose-Capillary: 203 mg/dL — ABNORMAL HIGH (ref 70–99)
Glucose-Capillary: 231 mg/dL — ABNORMAL HIGH (ref 70–99)

## 2022-12-12 LAB — BASIC METABOLIC PANEL
Anion gap: 9 (ref 5–15)
BUN: 60 mg/dL — ABNORMAL HIGH (ref 8–23)
CO2: 15 mmol/L — ABNORMAL LOW (ref 22–32)
Calcium: 8.5 mg/dL — ABNORMAL LOW (ref 8.9–10.3)
Chloride: 112 mmol/L — ABNORMAL HIGH (ref 98–111)
Creatinine, Ser: 2.3 mg/dL — ABNORMAL HIGH (ref 0.44–1.00)
GFR, Estimated: 22 mL/min — ABNORMAL LOW (ref >60)
Glucose, Bld: 79 mg/dL (ref 70–99)
Potassium: 5.3 mmol/L — ABNORMAL HIGH (ref 3.5–5.1)
Sodium: 136 mmol/L (ref 135–145)

## 2022-12-12 LAB — CBC
HCT: 24.3 % — ABNORMAL LOW (ref 36.0–46.0)
Hemoglobin: 7.8 g/dL — ABNORMAL LOW (ref 12.0–15.0)
MCH: 30.4 pg (ref 26.0–34.0)
MCHC: 32.1 g/dL (ref 30.0–36.0)
MCV: 94.6 fL (ref 80.0–100.0)
Platelets: 269 10*3/uL (ref 150–400)
RBC: 2.57 MIL/uL — ABNORMAL LOW (ref 3.87–5.11)
RDW: 17.1 % — ABNORMAL HIGH (ref 11.5–15.5)
WBC: 10.2 10*3/uL (ref 4.0–10.5)
nRBC: 0.3 % — ABNORMAL HIGH (ref 0.0–0.2)

## 2022-12-12 LAB — URINALYSIS, ROUTINE W REFLEX MICROSCOPIC

## 2022-12-12 LAB — URINALYSIS, MICROSCOPIC (REFLEX): WBC, UA: >50 WBC/hpf (ref 0–5)

## 2022-12-12 LAB — TYPE AND SCREEN
ABO/RH(D): O POS
Antibody Screen: NEGATIVE
Unit division: 0

## 2022-12-12 LAB — BPAM RBC
Blood Product Expiration Date: 202403142359
ISSUE DATE / TIME: 202402132009
Unit Type and Rh: 5100

## 2022-12-12 SURGERY — OPEN REDUCTION INTERNAL FIXATION (ORIF) DISTAL FEMUR FRACTURE
Anesthesia: General | Laterality: Right

## 2022-12-12 MED ORDER — VENLAFAXINE HCL ER 150 MG PO CP24
150.0000 mg | ORAL_CAPSULE | Freq: Every day | ORAL | Status: DC
  Administered 2022-12-12 – 2022-12-18 (×7): 150 mg via ORAL
  Filled 2022-12-12 (×7): qty 1

## 2022-12-12 MED ORDER — ORAL CARE MOUTH RINSE: 15.0000 mL | Freq: Once | OROMUCOSAL | Status: AC

## 2022-12-12 MED ORDER — CHLORHEXIDINE GLUCONATE 4 % EX LIQD: 60.0000 mL | Freq: Once | CUTANEOUS | Status: DC

## 2022-12-12 MED ORDER — SODIUM CHLORIDE 0.9 % IV SOLN
1.0000 g | INTRAVENOUS | Status: AC
  Administered 2022-12-12 – 2022-12-16 (×5): 1 g via INTRAVENOUS
  Filled 2022-12-12 (×5): qty 10

## 2022-12-12 MED ORDER — HYDROMORPHONE HCL 1 MG/ML IJ SOLN
0.5000 mg | INTRAMUSCULAR | Status: DC | PRN
  Administered 2022-12-12 – 2022-12-14 (×12): 0.5 mg via INTRAVENOUS
  Filled 2022-12-12 (×13): qty 0.5

## 2022-12-12 MED ORDER — ATORVASTATIN CALCIUM 80 MG PO TABS
80.0000 mg | ORAL_TABLET | Freq: Every day | ORAL | Status: DC
  Administered 2022-12-12 – 2022-12-18 (×7): 80 mg via ORAL
  Filled 2022-12-12 (×7): qty 1

## 2022-12-12 MED ORDER — LACTATED RINGERS IV SOLN: INTRAVENOUS | Status: DC

## 2022-12-12 MED ORDER — SODIUM CHLORIDE 0.9 % IV SOLN: INTRAVENOUS | Status: AC

## 2022-12-12 MED ORDER — ROCURONIUM BROMIDE 10 MG/ML (PF) SYRINGE
PREFILLED_SYRINGE | INTRAVENOUS | Status: AC
  Filled 2022-12-12: qty 10

## 2022-12-12 MED ORDER — TRANEXAMIC ACID-NACL 1000-0.7 MG/100ML-% IV SOLN
1000.0000 mg | INTRAVENOUS | Status: DC
  Filled 2022-12-12: qty 100

## 2022-12-12 MED ORDER — LEVETIRACETAM 500 MG PO TABS
500.0000 mg | ORAL_TABLET | Freq: Two times a day (BID) | ORAL | Status: DC
  Administered 2022-12-12 – 2022-12-18 (×14): 500 mg via ORAL
  Filled 2022-12-12 (×14): qty 1

## 2022-12-12 MED ORDER — GABAPENTIN 300 MG PO CAPS
300.0000 mg | ORAL_CAPSULE | Freq: Every day | ORAL | Status: DC
  Administered 2022-12-12 – 2022-12-17 (×7): 300 mg via ORAL
  Filled 2022-12-12 (×7): qty 1

## 2022-12-12 MED ORDER — SODIUM ZIRCONIUM CYCLOSILICATE 10 G PO PACK
10.0000 g | PACK | Freq: Once | ORAL | Status: AC
  Administered 2022-12-12: 10 g via ORAL
  Filled 2022-12-12: qty 1

## 2022-12-12 MED ORDER — SODIUM CHLORIDE 0.9 % IV SOLN: INTRAVENOUS | Status: DC

## 2022-12-12 MED ORDER — PROPOFOL 10 MG/ML IV BOLUS
INTRAVENOUS | Status: AC
  Filled 2022-12-12: qty 20

## 2022-12-12 MED ORDER — ALLOPURINOL 100 MG PO TABS
100.0000 mg | ORAL_TABLET | Freq: Every day | ORAL | Status: DC
  Administered 2022-12-12 – 2022-12-18 (×7): 100 mg via ORAL
  Filled 2022-12-12 (×7): qty 1

## 2022-12-12 MED ORDER — HYDRALAZINE HCL 50 MG PO TABS
50.0000 mg | ORAL_TABLET | Freq: Three times a day (TID) | ORAL | Status: DC
  Administered 2022-12-12 – 2022-12-18 (×19): 50 mg via ORAL
  Filled 2022-12-12 (×19): qty 1

## 2022-12-12 MED ORDER — FENTANYL CITRATE (PF) 250 MCG/5ML IJ SOLN
INTRAMUSCULAR | Status: AC
  Filled 2022-12-12: qty 5

## 2022-12-12 MED ORDER — ACETAMINOPHEN 325 MG PO TABS
650.0000 mg | ORAL_TABLET | Freq: Four times a day (QID) | ORAL | Status: DC | PRN
  Administered 2022-12-12 – 2022-12-14 (×7): 650 mg via ORAL
  Filled 2022-12-12 (×7): qty 2

## 2022-12-12 MED ORDER — CHLORHEXIDINE GLUCONATE 0.12 % MT SOLN
15.0000 mL | Freq: Once | OROMUCOSAL | Status: AC
  Administered 2022-12-12: 15 mL via OROMUCOSAL
  Filled 2022-12-12: qty 15

## 2022-12-12 MED ORDER — POLYETHYLENE GLYCOL 3350 17 G PO PACK: 17.0000 g | PACK | Freq: Every day | ORAL | Status: DC | PRN

## 2022-12-12 MED ORDER — DEXTROSE 50 % IV SOLN
INTRAVENOUS | Status: AC
  Administered 2022-12-12: 25 mL
  Filled 2022-12-12: qty 50

## 2022-12-12 MED ORDER — MIDAZOLAM HCL 2 MG/2ML IJ SOLN
INTRAMUSCULAR | Status: AC
  Filled 2022-12-12: qty 2

## 2022-12-12 MED ORDER — METOPROLOL SUCCINATE ER 25 MG PO TB24
37.5000 mg | ORAL_TABLET | Freq: Every day | ORAL | Status: DC
  Administered 2022-12-13 – 2022-12-18 (×6): 37.5 mg via ORAL
  Filled 2022-12-12 (×7): qty 2

## 2022-12-12 MED ORDER — INSULIN ASPART 100 UNIT/ML IJ SOLN
0.0000 [IU] | INTRAMUSCULAR | Status: DC
  Administered 2022-12-12: 5 [IU] via SUBCUTANEOUS
  Administered 2022-12-13 (×2): 3 [IU] via SUBCUTANEOUS
  Administered 2022-12-13 (×2): 2 [IU] via SUBCUTANEOUS
  Administered 2022-12-14 (×2): 3 [IU] via SUBCUTANEOUS
  Administered 2022-12-15: 5 [IU] via SUBCUTANEOUS
  Administered 2022-12-15 – 2022-12-16 (×4): 3 [IU] via SUBCUTANEOUS
  Administered 2022-12-16: 8 [IU] via SUBCUTANEOUS
  Administered 2022-12-16 (×2): 3 [IU] via SUBCUTANEOUS
  Administered 2022-12-16 – 2022-12-17 (×3): 2 [IU] via SUBCUTANEOUS
  Administered 2022-12-17: 3 [IU] via SUBCUTANEOUS
  Administered 2022-12-17: 2 [IU] via SUBCUTANEOUS
  Administered 2022-12-17: 5 [IU] via SUBCUTANEOUS
  Administered 2022-12-18 (×2): 2 [IU] via SUBCUTANEOUS

## 2022-12-12 MED ORDER — POVIDONE-IODINE 10 % EX SWAB
2.0000 | Freq: Once | CUTANEOUS | Status: AC
  Administered 2022-12-12: 2 via TOPICAL

## 2022-12-12 MED ORDER — ACETAMINOPHEN 650 MG RE SUPP: 650.0000 mg | Freq: Four times a day (QID) | RECTAL | Status: DC | PRN

## 2022-12-12 MED ORDER — PANCRELIPASE (LIP-PROT-AMYL) 36000-114000 UNITS PO CPEP
36000.0000 [IU] | ORAL_CAPSULE | Freq: Three times a day (TID) | ORAL | Status: DC
  Administered 2022-12-12 – 2022-12-18 (×14): 36000 [IU] via ORAL
  Filled 2022-12-12 (×14): qty 1

## 2022-12-12 MED ORDER — LIDOCAINE 2% (20 MG/ML) 5 ML SYRINGE
INTRAMUSCULAR | Status: AC
  Filled 2022-12-12: qty 5

## 2022-12-12 MED ORDER — CEFAZOLIN SODIUM-DEXTROSE 2-4 GM/100ML-% IV SOLN
2.0000 g | INTRAVENOUS | Status: DC
  Filled 2022-12-12: qty 100

## 2022-12-12 NOTE — Plan of Care (Signed)

## 2022-12-12 NOTE — Progress Notes (Deleted)
Initial neurology clinic note  Heather [name] MRN: [id number] DOB: 1951-10-18  Referring provider: Finis Bud, NP  Primary care provider: [name], MD  Reason for consult:  Falls  Subjective:  This is [name], a 72 y.o. ***-handed female with a medical history of stroke***, meningioma, cervical and lumbar spine disease s/p surgery, HTN, HLD, CAD, CHF, pHTN, Crohn's disease, chronic pancreatitis, DM2, CKD4, BPPV who presents to neurology clinic with frequent falls. The patient is accompanied by ***.  ***  Having frequent falls - weakness, double vision? Had complex right humeral head and neck fracture seen on CT c/a/p in 06/2022*** On gabapentin?***  Patient was referred by cardiology on 11/01/22 for frequent falls. Per note by Finis Bud, NP:  Since I last saw patient, she was admitted to [facility] in 07/2022 for a syncopal episode. She had a recent fall at home with fx of left patella. Was at orthopedics office when she sustained a near syncope and then syncopal episode. She sustained facial injuries. Echocardiogram showed normal EF, grade 1 DD, mild pulmonary HTN, otherwise no significant valvular abnormalities.  MRI of brain showed nothing acute, 6 mm meningioma overlying anterior right frontal lobe, symmetric remote insults within the global , pallidi Bilaterally -which may reflect chronic lacunar infarcts or sequela of prior toxic/metabolic insult, mild chronic small vessel ischemic changes within the cerebral white matter and pons, tiny chronic infarct within the left cerebellar hemisphere, mild generalized parenchymal atrophy.  CT of the head did not show anything acute, nasal fractures noted.  No DVT noted with lower extremity Dopplers.  Chest x-ray was unremarkable.  EKG showed normal sinus rhythm, nonspecific ST and T wave abnormality.  Her syncopal episode was probably due to bradycardia, therefore her beta-blocker was held.  PT and OT were consulted.   Orthopedics decided not to operate at this time and would follow her up outpatient.   Admitted to [facility] 09/2022 for CC of frequent falls, fell 2 days prior to ED visit while in the bathroom, hit her head on the side of a table. Patient reported falls were secondary to generalized weakness.  Hospital course was complicated by AKI on CKD stage IV as well as hyperkalemia, hyponatremia, and hyperglycemia.  Received IV fluids and Lokelma.  Renal ultrasound did not show hydronephrosis.  Was compensated on exam.  Hydralazine was held secondary to soft blood pressures and metoprolol succinate dose was lowered.  PT followed her and recommended SNF due to frequent falls.  Did receive PRBC due to anemia from CKD.  Was discharged in stable condition to SNF.   Today she states she refused SNF and went home, even though the medical team recommended SNF. She is doing well from a cardiac perspective and here with her husband.  Denies any chest pain, shortness of breath, palpitations, syncope, presyncope, dizziness, orthopnea, PND, swelling, significant weight changes, acute bleeding, claudication.  She does admit to falls since she left the hospital. Denies any injuries. Sees a nephrologist regularly. Denies any other questions or concerns today.  Patient had another fall on 12/11/22 when she rolled out of bed and fell on the floor. She was unable to get up due to pain in RLE. She then presented to the ED. CT head and cervical spine showed no acute process. Xray of right femur showed displaced oblique fracture of distal femur at diaphyseal metaphyseal junction. ***  She is supposed to be using a walker to ambulate.***  MEDICATIONS:  Facility-Administered Encounter Medications as of  12/19/2022  Medication   acetaminophen (TYLENOL) tablet 650 mg   Or   acetaminophen (TYLENOL) suppository 650 mg   allopurinol (ZYLOPRIM) tablet 100 mg   atorvastatin (LIPITOR) tablet 80 mg   gabapentin (NEURONTIN) capsule 300 mg    hydrALAZINE (APRESOLINE) tablet 50 mg   HYDROmorphone (DILAUDID) injection 0.5 mg   insulin aspart (novoLOG) injection 0-15 Units   levETIRAcetam (KEPPRA) tablet 500 mg   lipase/protease/amylase (CREON) capsule 36,000 Units   metoprolol succinate (TOPROL-XL) 24 hr tablet 37.5 mg   polyethylene glycol (MIRALAX / GLYCOLAX) packet 17 g   venlafaxine XR (EFFEXOR-XR) 24 hr capsule 150 mg   Outpatient Encounter Medications as of 12/19/2022  Medication Sig   acetaminophen (TYLENOL) 325 MG tablet Take 2 tablets (650 mg total) by mouth every 4 (four) hours as needed for mild pain, moderate pain, fever or headache.   allopurinol (ZYLOPRIM) 100 MG tablet Take 100 mg by mouth daily.   atorvastatin (LIPITOR) 80 MG tablet Take 1 tablet (80 mg total) by mouth daily. Restart on 06/29/22   diphenhydrAMINE (BENADRYL) 2 % cream Apply topically 3 (three) times daily as needed for itching. (Patient not taking: Reported on 12/11/2022)   furosemide (LASIX) 40 MG tablet TAKE 1 TABLET BY MOUTH ONCE DAILY ALONG WITH 20MG TABLET FOR INCREASED FLUID RETENTION (Patient not taking: Reported on 12/11/2022)   gabapentin (NEURONTIN) 300 MG capsule Take 1 capsule by mouth at bedtime.   hydrALAZINE (APRESOLINE) 50 MG tablet Take 1 tablet (50 mg total) by mouth every 8 (eight) hours.   insulin aspart (NOVOLOG) 100 UNIT/ML FlexPen Inject 5 Units into the skin 3 (three) times daily with meals. Give only if eats 50% or more of meal. (Patient taking differently: Inject 10 Units into the skin daily. Give only if eats 50% or more of meal.)   insulin glargine-yfgn (SEMGLEE) 100 UNIT/ML Pen INJECT 15 UNITS UNDER THE SKIN ONCE DAILY   levETIRAcetam (KEPPRA) 500 MG tablet Take 500 mg by mouth 2 (two) times daily.   lipase/protease/amylase (CREON) 36000 UNITS CPEP capsule Take 36,000 Units by mouth 3 (three) times daily with meals.   methocarbamol (ROBAXIN) 500 MG tablet Take 1 tablet (500 mg total) by mouth every 8 (eight) hours as needed for  muscle spasms.   metoprolol succinate (TOPROL-XL) 25 MG 24 hr tablet Take 1.5 tablets (37.5 mg total) by mouth daily.   naloxone (NARCAN) nasal spray 4 mg/0.1 mL Place 1 spray into the nose once.   naproxen sodium (ALEVE) 220 MG tablet Take 440 mg by mouth daily as needed (pain).   polyethylene glycol (MIRALAX / GLYCOLAX) 17 g packet Take 17 g by mouth daily. (Patient not taking: Reported on 12/11/2022)   traMADol (ULTRAM) 50 MG tablet Take 50 mg by mouth 2 (two) times daily.   venlafaxine XR (EFFEXOR-XR) 150 MG 24 hr capsule Take 150 mg by mouth daily.    PAST MEDICAL HISTORY: Past Medical History:  Diagnosis Date   Acute renal failure superimposed on stage 3 chronic kidney disease ([place] ) 01/21/2016   Anemia    Anxiety    B12 deficiency    Brain tumor (benign) ([place]) 10/29/2020   Cellulitis and abscess    Abdomen and buttocks   Chronic diastolic CHF (congestive heart failure) ([place]) 03/16/2022   CKD (chronic kidney disease) stage 3, GFR 30-59 ml/min (HCC)    Closed fracture of distal end of femur, unspecified fracture morphology, initial encounter ([place])    Coronary arteriosclerosis 07/15/2018   Crohn disease ([place])  Depression    Diabetic neuropathy (HCC)    feet   DJD (degenerative joint disease)    Right forminal stenosis C4-5   Essential hypertension    Femur fracture, right (HCC) 123456   Folliculitis    GERD (gastroesophageal reflux disease)    Gout    Headache(784.0)    Hiatal hernia    History of blood transfusion    History of bronchitis    History of cardiac catheterization    No significant CAD 2012   History of kidney stones    surgery to remove   Insomnia    Irritable bowel syndrome    Mixed hyperlipidemia    Nausea & vomiting 12/22/2014   Osteoarthritis    Palpitations    Pneumonia    Reflux esophagitis    Salmonella    Stroke [facility])    Tinnitus    Right   Type 2 diabetes mellitus ([place])     PAST SURGICAL HISTORY: Past Surgical History:   Procedure Laterality Date   BACK SURGERY     c-spine surgery     03/2007   CARDIAC CATHETERIZATION     CHOLECYSTECTOMY     COLONOSCOPY  04/21/2011; 05/29/11   6/12: [place] - ?AVM at IC valve, inflammatory changes at IC valve [facility]); 7/12 - gessner; IC valve erosions, look chronic and probably Crohn's per path   colonoscopy  2017   : random biopsies normal. TI normal   ESOPHAGOGASTRODUODENOSCOPY  05/29/11   normal   ESOPHAGOGASTRODUODENOSCOPY N/A 01/21/2016   Dr. Michail Sermon: normal   EYE SURGERY Bilateral    cataracts removed   FEMUR IM NAIL Right 04/10/2018   Procedure: INTRAMEDULLARY (IM) NAIL FEMORAL;  Surgeon: Paralee Cancel, MD;  Location: [facility];  Service: Orthopedics;  Laterality: Right;   FRACTURE SURGERY     GIVENS CAPSULE STUDY  11/17/2020   HARDWARE REMOVAL Right 04/10/2018   Procedure: HARDWARE REMOVAL RIGHT DISTAL FEMUR;  Surgeon: Paralee Cancel, MD;  Location: [facility];  Service: Orthopedics;  Laterality: Right;   HERNIA REPAIR     IR FLUORO GUIDE CV LINE RIGHT  04/01/2017   IR US GUIDE VASC ACCESS RIGHT  04/01/2017   JOINT REPLACEMENT Right    hip   OPEN REDUCTION INTERNAL FIXATION (ORIF) DISTAL RADIAL FRACTURE Right 05/28/2019   Procedure: OPEN REDUCTION INTERNAL FIXATION (ORIF) DISTAL RADIAL FRACTURE;  Surgeon: Verner Mould, MD;  Location: [place];  Service: Orthopedics;  Laterality: Right;  with block 90 minutes   ORIF FEMUR FRACTURE Right 03/31/2017   Procedure: OPEN REDUCTION INTERNAL FIXATION (ORIF) DISTAL FEMUR FRACTURE;  Surgeon: Paralee Cancel, MD;  Location: [place];  Service: Orthopedics;  Laterality: Right;   removal of kidney stone     Right knee arthroscopic surgery     TONSILLECTOMY     TOTAL ABDOMINAL HYSTERECTOMY      ALLERGIES: Allergies  Allergen Reactions   Cozaar [Losartan] Nausea And Vomiting, Swelling and Rash   Quinolones Itching, Nausea And Vomiting, Nausea Only and Swelling    Swelling of face, jaw, and lips    Cymbalta  [Duloxetine Hcl] Itching and Swelling   Sulfa Antibiotics Nausea And Vomiting and Swelling    Headache  Swelling of feet, legs    Synalar [Fluocinolone] Swelling   Cipro [Ciprofloxacin Hcl] Nausea And Vomiting and Swelling    Swelling of face, jaw, lips   Diovan [Valsartan] Nausea And Vomiting and Swelling   Glucophage [Metformin] Other (See Comments)    GI upset  Ketalar [Ketamine] Swelling   Norco [Hydrocodone-Acetaminophen] Itching and Swelling   Norvasc [Amlodipine Besylate] Itching and Swelling    Fluid retention    Nsaids Nausea And Vomiting and Other (See Comments)    Has bleeding ulcers   Tape Itching and Rash    Paper tape can only be used on this patient   Zestril [Lisinopril] Swelling and Rash    Oral swelling Red streaks on arms/legs    FAMILY HISTORY: Family History  Problem Relation Age of Onset   Diabetes Mother    Colon cancer Brother        POSSIBLY. Patient states diagnosed at age 38, then later states age 67. 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     Objective:  Vital Signs:  LMP  (LMP Unknown)   ***  Labs and Imaging review: Internal labs: Lab Results  Component Value Date   HGBA1C >15.5 (H) 10/17/2022   Lab Results  Component Value Date   VITAMINB12 837 11/14/2022   Lab Results  Component Value Date   TSH 2.284 10/15/2022   Lab Results  Component Value Date    ESRSEDRATE 28 (H) 03/16/2022   CK (10/18/22): 49  CBC (12/12/22): anemia (Hb 7.8) BMP (12/12/22): multiple metabolic abnormalities Component     Latest Ref Rng 12/12/2022  Sodium     135 - 145 mmol/L 136   Potassium     3.5 - 5.1 mmol/L 5.3 (H)   Chloride     98 - 111 mmol/L 112 (H)   CO2     22 - 32 mmol/L 15 (L)   Glucose     70 - 99 mg/dL 79   BUN     8 - 23 mg/dL 60 (H)   Creatinine     0.44 - 1.00 mg/dL 2.30 (H)   Calcium     8.9 - 10.3 mg/dL 8.5 (L)   GFR, Estimated     >60 mL/min 22 (L)   Anion gap     5 - 15  9      ***  Imaging: CT head wo contrast (12/11/22): FINDINGS: Brain: Mild diffuse cerebral atrophy. Low-attenuation changes in the deep white matter consistent small vessel ischemia. No mass-effect or midline shift. No abnormal extra-axial fluid collections. Gray-white matter junctions are mostly distinct. Basal cisterns are not effaced. No acute intracranial hemorrhage.   Vascular: Diffuse vascular calcification.   Skull: Calvarium appears intact.   Sinuses/Orbits: Motion artifact limits evaluation but paranasal sinuses and mastoid air cells appear clear.   Other: None.   IMPRESSION: No acute intracranial abnormalities. Chronic atrophy and small vessel ischemic changes.  CT cervical spine wo contrast (12/11/22): FINDINGS: Alignment: Normal alignment.   Skull base and vertebrae: Skull base  appears intact. No vertebral compression deformities. No focal bone lesion or bone destruction.   Soft tissues and spinal canal: No prevertebral soft tissue swelling. No abnormal paraspinal soft tissue mass or infiltration. Vascular calcifications.   Disc levels: Postoperative changes with anterior plate and screw fixation at C3-4 and C7-T1. Posterior rod and screw fixation at C7-T1. Intervertebral disc fusion at C3-4, C4-5, C5-6, C6-7, and C7-T1 levels. Degenerative changes throughout the posterior facet joints.   Upper chest: Lung apices are clear.    Other: Appearances are similar to prior study.   IMPRESSION: 1. Normal alignment.  No acute displaced fractures are identified. 2. Postoperative changes with intervertebral disc fusion throughout the cervical spine and internal fixation as discussed above.  Right femur xray (12/11/22): IMPRESSION: Displaced oblique fracture of the distal femur at the diaphyseal metaphyseal junction. Previous gamma nail placement for treatment of intertrochanteric fracture.  MRI lumbar spine wo contrast (10/17/22): FINDINGS: Segmentation:  Standard.   Alignment:  Unchanged 3 mm retrolisthesis at L1-L2.   Vertebrae: Chronic T12 superior endplate compression fracture continues to demonstrate fluid within the fracture cleft and associated marrow edema, without progressive height loss. Unchanged 3 mm retropulsion of the posterosuperior endplate. No new fracture. Prior L1-L4 fusion. No evidence of discitis or suspicious bone lesion.   Conus medullaris and cauda equina: Conus extends to the T12-L1 level. Conus and cauda equina appear normal.   Paraspinal and other soft tissues: Negative.   Disc levels:   T11-T12: Unchanged minimal disc bulging and mild bilateral facet arthropathy. Unchanged mild right neuroforaminal stenosis. No spinal canal or left neuroforaminal stenosis.   T12-L1:  Unchanged minimal disc bulging.  No stenosis.   L1-L2: Prior posterior fusion. No significant disc bulge or herniation. No stenosis.   L2-L3:  Prior PLIF.  No residual stenosis.   L3-L4:  Prior PLIF.  No residual stenosis.   L4-L5: Unchanged small broad-based posterior disc protrusion and moderate bilateral facet arthropathy. Unchanged mild to moderate left neuroforaminal stenosis. No spinal canal or right neuroforaminal stenosis.   L5-S1: Negative disc. Unchanged mild-to-moderate bilateral facet arthropathy. No stenosis.   IMPRESSION: 1. Chronic T12 superior endplate compression fracture  with persistent marrow edema but no progressive height loss. No new fracture. 2. Prior L1-L4 fusion without residual stenosis. 3. Unchanged mild-to-moderate left neuroforaminal stenosis at L4-L5.  MRI brain wo contrast (06/20/22): FINDINGS: Mild intermittent motion degradation.   Brain:   Mild generalized parenchymal atrophy.   6 mm extra-axial dural-based mass overlying the anterior right frontal lobe compatible with a small meningioma (series 12, image 25).   Symmetric remote insults within the globus pallidus, bilaterally.   Multifocal T2 FLAIR hyperintense signal abnormality within the cerebral white matter and pons, nonspecific but compatible with mild chronic small vessel ischemic disease.   Tiny chronic infarcts within the left cerebellar hemisphere.   There is no acute infarct.   No evidence of an intracranial mass.   No chronic intracranial blood products.   No extra-axial fluid collection.   No midline shift.   Vascular: Maintained flow voids within the proximal large arterial vessels.   Skull and upper cervical spine: No focal suspicious marrow lesion. Susceptibility artifact arising from ACDF hardware.   Sinuses/Orbits: No mass or acute finding within the imaged orbits. No significant paranasal sinus disease.   Other: Trace fluid within the left mastoid air cells.   IMPRESSION: 1. Mildly motion degraded exam. 2. No evidence of acute intracranial abnormality. 3. Stable non-contrast MRI appearance of the brain. 4. 6  mm meningioma overlying the anterior right frontal lobe. 5. Symmetric foci of signal abnormality within the globus pallidus bilaterally, which may reflect chronic lacunar infarcts or sequelae of a prior toxic/metabolic insult. 6. Mild chronic small ischemic changes within the cerebral white matter and pons. 7. Tiny chronic infarcts within the left cerebellar hemisphere. 8. Mild generalized parenchymal atrophy.  ***  Assessment/Plan:   SHANEKA JANET is a 72 y.o. female who presents for evaluation of ***. *** has a relevant medical history of ***. *** neurological examination is pertinent for ***. Available diagnostic data is significant for ***. This constellation of symptoms and objective data would most likely localize to ***. ***  PLAN: -Blood work: *** ***  -Return to clinic ***  The impression above as well as the plan as outlined below were extensively discussed with the patient (in the company of ***) who voiced understanding. All questions were answered to their satisfaction.  The patient was counseled on pertinent fall precautions per the printed material provided today, and as noted under the "Patient Instructions" section below.***  When available, results of the above investigations and possible further recommendations will be communicated to the patient via telephone/MyChart. Patient to call office if not contacted after expected testing turnaround time.   Total time spent reviewing records, interview, history/exam, documentation, and coordination of care on day of encounter:  *** min   Thank you for allowing me to participate in patient's care.  If I can answer any additional questions, I would be pleased to do so.  Kai Levins, MD   CC: [name], [place] [address]  CC: Referring provider: Finis Bud, NP [address] [place],  [place] [address]

## 2022-12-12 NOTE — TOC Initial Note (Signed)
Transition of Care [facility]) - Initial/Assessment Note    Patient Details  Name: Heather [name] MRN: [id number] Date of Birth: 11/14/1950  Transition of Care [facility]) CM/SW Contact:    Sharin Mons, RN Phone Number: 12/12/2022, 10:53 AM  Clinical Narrative:                 Presented after a fall, suffered R femur fx. From home with husband. PTA independent with ADL's. Pt states uses a cane with ambulation. States has a ramp that leads to the home. Per husband pt was recently released from SNF/[facility]) , 2/5. Stay was for 20 days. Pt will be in copay days for SNF if needed @ d/c.  Plan: ORIF today (2/14)with Dr. Doreatha Martin.   TOC team following and will assist with TOC needs.....   Expected Discharge Plan: [place] (vs SNF) Barriers to Discharge: Continued Medical Work up   Patient Goals and CMS Choice            Expected Discharge Plan and Services                                              Prior Living Arrangements/Services     Patient language and need for interpreter reviewed:: Yes Do you feel safe going back to the place where you live?: Yes      Need for Family Participation in Patient Care: Yes (Comment) Care giver support system in place?: Yes (comment) Current home services: DME ([facility]) Criminal Activity/Legal Involvement Pertinent to Current Situation/Hospitalization: No - Comment as needed  Activities of Daily Living Home Assistive Devices/Equipment: Wheelchair ADL Screening (condition at time of admission) Patient's cognitive ability adequate to safely complete daily activities?: Yes Is the patient deaf or have difficulty hearing?: No Does the patient have difficulty seeing, even when wearing glasses/contacts?: No Does the patient have difficulty concentrating, remembering, or making decisions?: No Patient able to express need for assistance with ADLs?: Yes Does the patient have difficulty dressing or  bathing?: Yes Independently performs ADLs?: No Communication: Independent Dressing (OT): Needs assistance, Dependent Is this a change from baseline?: Pre-admission baseline Grooming: Dependent, Needs assistance Is this a change from baseline?: Pre-admission baseline Feeding: Needs assistance Is this a change from baseline?: Pre-admission baseline Bathing: Dependent, Needs assistance Is this a change from baseline?: Pre-admission baseline Toileting: Dependent, Needs assistance Is this a change from baseline?: Pre-admission baseline In/Out Bed: Dependent, Needs assistance Is this a change from baseline?: Pre-admission baseline Does the patient have difficulty walking or climbing stairs?: Yes Weakness of Legs: Both Weakness of Arms/Hands: None  Permission Sought/Granted   Permission granted to share information with : Yes, Verbal Permission Granted  Share Information with NAME: Shakiara Wormser Sr.  Spouse  [phone]           Emotional Assessment Appearance:: Appears stated age Attitude/Demeanor/Rapport: Engaged Affect (typically observed): Accepting Orientation: : Oriented to Self, Oriented to Place, Oriented to  Time, Oriented to Situation Alcohol / Substance Use: Not Applicable Psych Involvement: No (comment)  Admission diagnosis:  Fracture [T14.8XXA] Fall [W19.XXXA] Anemia, unspecified type [D64.9] Closed displaced basicervical fracture of right femur, initial encounter [facility]) [id number] Patient Active Problem List   Diagnosis Date Noted   Fracture 12/11/2022   Pseudohyponatremia 11/12/2022   Thrombocytosis 11/12/2022   DKA (diabetic ketoacidosis) ([place]) 11/12/2022   Uncontrolled type 2 diabetes  mellitus with hyperglycemia ([place]) 11/11/2022   AKI (acute kidney injury) ([place]) 10/17/2022   DKA, type 2 ([place]) 10/16/2022   History of right shoulder fracture 10/15/2022   Aortic atherosclerosis ([place]) 08/01/2022   Recurrent falls 07/04/2022   Humeral head fracture 07/04/2022    Right leg weakness 06/20/2022   Anxiety 06/20/2022   UTI (urinary tract infection) 06/20/2022   Malnutrition of moderate degree 06/20/2022   Hyperkalemia 06/19/2022   Type 2 diabetes mellitus with hyperosmolar nonketotic hyperglycemia ([place]) 05/27/2022   Acute kidney failure ([place]) 03/17/2022   Mixed diabetic hyperlipidemia associated with type 2 diabetes mellitus ([place]) 03/16/2022   Chronic diastolic CHF (congestive heart failure) ([place]) 03/16/2022   Decubitus ulcer of left buttock, stage 2 ([place]) 03/16/2022   Cellulitis of left hand 11/30/2021   Closed fracture of part of upper end of humerus 11/30/2021   At risk for falls 11/28/2021   Neoplasm of meninges 11/28/2021   Sepsis ([place]) 11/28/2021   Acute on chronic anemia 11/28/2021   Cerebral thrombosis with cerebral infarction 08/28/2021   Cerebrovascular accident (CVA) due to embolism of cerebral artery ([place])    Pressure injury of skin 08/26/2021   Acute respiratory failure with hypoxia ([place]) [id number]   Acute metabolic encephalopathy 99991111   Malignant hypertension 08/25/2021   Thrombocytopenia ([place]) 08/25/2021   Anemia due to chronic kidney disease 08/25/2021   Blurry vision, bilateral 08/16/2021   Hyponatremia 04/01/2021   COVID-19 03/30/2021   Senile osteoporosis 02/10/2021   Macrocytic anemia    E coli enteritis 01/09/2021   Acute pancreatitis 01/08/2021   Acute renal failure superimposed on stage 4 chronic kidney disease ([place]) 01/07/2021   Diarrhea    Acute hypokalemia 01/06/2021   Long-term use of immunosuppressant medication-anticipated 11/28/2020   Elevated blood-pressure reading, without diagnosis of hypertension 05/30/2020   Meningioma ([place]) 05/30/2020   Headache 05/17/2020   Hypokalemia 05/17/2020   Hypomagnesemia 05/17/2020   Dehydration 05/17/2020   Hypertensive urgency 05/17/2020   Visual disturbance 05/17/2020   Left hip pain 09/22/2019   Lumbar radiculopathy 09/22/2019   Postoperative anemia due to  acute blood loss 09/18/2019   Postoperative urinary retention 09/18/2019   Closed fracture of distal end of radius 05/26/2019   Coronary arteriosclerosis 07/15/2018   Closed right hip fracture ([place]) 04/08/2018   Elevated lipase    Crohn's disease of small intestine without complication ([place])    Protein-calorie malnutrition, severe 03/05/2018   Femur fracture, right ([place]) 03/30/2017   Neck pain 03/12/2017   Acute kidney injury superimposed on CKD ([place]) 01/25/2016   Neuropathy 03/02/2015   Pain in the chest    Chest pain 12/22/2014   Hypertension 12/22/2014   Nausea & vomiting 12/22/2014   Uncontrolled type 2 diabetes mellitus with hyperglycemia, with long-term current use of insulin ([place]) 12/22/2014   Hiatal hernia 12/22/2014   GERD (gastroesophageal reflux disease) 12/22/2014   Gout 12/22/2014   Mixed hyperlipidemia 12/22/2014   Depression 12/22/2014   Type 2 diabetes mellitus without complications ([place]) [id number]   Fusion of spine, cervicothoracic region 11/01/2014   Pseudophakia, left eye 10/20/2014   Chronic kidney disease (CKD), stage IV (severe) ([place]) 08/25/2014   Cervical stenosis of spine 08/26/2013   Vitamin B12 deficiency 07/08/2011   Personal history of failed moderate sedation 04/16/2011   Chronic Nausea and Vomiting - ? gastroapresis 04/16/2011   Early satiety 04/16/2011   Irritable bowel syndrome 04/16/2011   Benign paroxysmal positional vertigo 11/20/2010   TRICUSPID REGURGITATION, MODERATE 11/20/2010   PULMONARY HYPERTENSION, MILD 11/20/2010  LEG EDEMA, BILATERAL 11/20/2010   DYSPNEA 11/20/2010   NONSPECIFIC ABNORMAL ELECTROCARDIOGRAM 11/20/2010   CHEST WALL PAIN, HX OF 11/20/2010   PCP:  [name], MD Pharmacy:   [place], [place] [address] [place] [address] Phone: [phone] Fax: [place] [address] [place] [address] Phone:  [phone] Fax: [phone]     Social Determinants of Health (SDOH) Social History: [place]: No Food Insecurity (12/12/2022)  Housing: Low Risk  (12/12/2022)  Transportation Needs: No Transportation Needs (12/12/2022)  Utilities: Not At Risk (12/12/2022)  Tobacco Use: Low Risk  (12/11/2022)   SDOH Interventions:     Readmission Risk Interventions    11/14/2022   11:31 AM 11/12/2022    4:06 PM 10/18/2022   12:42 PM  Readmission Risk Prevention Plan  Transportation Screening  Complete Complete  Medication Review [employer])  Complete Complete  PCP or Specialist appointment within 3-5 days of discharge Complete Not Complete   HRI or Home Care Consult  Complete Complete  SW Recovery Care/Counseling Consult  Complete Complete  Palliative Care Screening  Not Applicable Not Applicable  Skilled Nursing Facility  Complete Complete

## 2022-12-12 NOTE — Progress Notes (Signed)
Patient retaining urine, bladder scan of 41m. MD Gherghe made aware- advised to place foley before surgery.

## 2022-12-12 NOTE — Consult Note (Signed)
Reason for Consult:Right distal femur fx Referring Physician: Marzetta Board Time called: [id number] Time at bedside: [place] is an 72 y.o. female.  HPI: Heather [name] fell out of bed and struck her right knee on the floor. Heather [name] had immediate pain and could not get up. Heather [name] was brought to the ED where x-rays showed a periprosthetic distal femur fx. Heather [name] was transferred to [facility] and orthopedic surgery was consulted. Heather [name] lives alone and only uses her legs for transfers.  Past Medical History:  Diagnosis Date   Acute renal failure superimposed on stage 3 chronic kidney disease ([place]) 01/21/2016   Anemia    Anxiety    B12 deficiency    Brain tumor (benign) ([place]) 10/29/2020   Cellulitis and abscess    Abdomen and buttocks   Chronic diastolic CHF (congestive heart failure) ([place]) 03/16/2022   CKD (chronic kidney disease) stage 3, GFR 30-59 ml/min (HCC)    Closed fracture of distal end of femur, unspecified fracture morphology, initial encounter ([place])    Coronary arteriosclerosis 07/15/2018   Crohn disease ([place])    Depression    Diabetic neuropathy (HCC)    feet   DJD (degenerative joint disease)    Right forminal stenosis C4-5   Essential hypertension    Femur fracture, right ([place]) 123456   Folliculitis    GERD (gastroesophageal reflux disease)    Gout    Headache(784.0)    Hiatal hernia    History of blood transfusion    History of bronchitis    History of cardiac catheterization    No significant CAD 2012   History of kidney stones    surgery to remove   Insomnia    Irritable bowel syndrome    Mixed hyperlipidemia    Nausea & vomiting 12/22/2014   Osteoarthritis    Palpitations    Pneumonia    Reflux esophagitis    Salmonella    Stroke [facility])    Tinnitus    Right   Type 2 diabetes mellitus ([place])     Past Surgical History:  Procedure Laterality Date   BACK SURGERY     c-spine surgery     03/2007   CARDIAC CATHETERIZATION     CHOLECYSTECTOMY     COLONOSCOPY   04/21/2011; 05/29/11   6/12: [place] - ?AVM at IC valve, inflammatory changes at IC valve [facility]); 7/12 - [facility]; IC valve erosions, look chronic and probably Crohn's per path   colonoscopy  2017   [place]: random biopsies normal. TI normal   ESOPHAGOGASTRODUODENOSCOPY  05/29/11   normal   ESOPHAGOGASTRODUODENOSCOPY N/A 01/21/2016   Dr. Michail Sermon: normal   EYE SURGERY Bilateral    cataracts removed   FEMUR IM NAIL Right 04/10/2018   Procedure: INTRAMEDULLARY (IM) NAIL FEMORAL;  Surgeon: Paralee Cancel, MD;  Location: [facility];  Service: Orthopedics;  Laterality: Right;   FRACTURE SURGERY     GIVENS CAPSULE STUDY  11/17/2020   HARDWARE REMOVAL Right 04/10/2018   Procedure: HARDWARE REMOVAL RIGHT DISTAL FEMUR;  Surgeon: Paralee Cancel, MD;  Location: [facility];  Service: Orthopedics;  Laterality: Right;   HERNIA REPAIR     IR FLUORO GUIDE CV LINE RIGHT  04/01/2017   IR US GUIDE VASC ACCESS RIGHT  04/01/2017   JOINT REPLACEMENT Right    hip   OPEN REDUCTION INTERNAL FIXATION (ORIF) DISTAL RADIAL FRACTURE Right 05/28/2019   Procedure: OPEN REDUCTION INTERNAL FIXATION (ORIF) DISTAL RADIAL FRACTURE;  Surgeon: Verner Mould, MD;  Location:  [place] [facility];  Service: Orthopedics;  Laterality: Right;  with block 90 minutes   ORIF FEMUR FRACTURE Right 03/31/2017   Procedure: OPEN REDUCTION INTERNAL FIXATION (ORIF) DISTAL FEMUR FRACTURE;  Surgeon: Paralee Cancel, MD;  Location: [place];  Service: Orthopedics;  Laterality: Right;   removal of kidney stone     Right knee arthroscopic surgery     TONSILLECTOMY     TOTAL ABDOMINAL HYSTERECTOMY      Family History  Problem Relation Age of Onset   Diabetes Mother    Colon cancer Brother        POSSIBLY. Patient states diagnosed at age 45, then later states age 42. unclear and limited historian   Esophageal cancer Neg Hx    Rectal cancer Neg Hx    Stomach cancer Neg Hx     Social History:  reports that Heather [name] has never smoked. Heather [name] has never used  smokeless tobacco. Heather [name] reports that Heather [name] does not drink alcohol and does not use drugs.  Allergies:  Allergies  Allergen Reactions   Cozaar [Losartan] Nausea And Vomiting, Swelling and Rash   Quinolones Itching, Nausea And Vomiting, Nausea Only and Swelling    Swelling of face, jaw, and lips    Cymbalta [Duloxetine Hcl] Itching and Swelling   Sulfa Antibiotics Nausea And Vomiting and Swelling    Headache  Swelling of feet, legs    Synalar [Fluocinolone] Swelling   Cipro [Ciprofloxacin Hcl] Nausea And Vomiting and Swelling    Swelling of face, jaw, lips   Diovan [Valsartan] Nausea And Vomiting and Swelling   Glucophage [Metformin] Other (See Comments)    GI upset   Ketalar [Ketamine] Swelling   Norco [Hydrocodone-Acetaminophen] Itching and Swelling   Norvasc [Amlodipine Besylate] Itching and Swelling    Fluid retention    Nsaids Nausea And Vomiting and Other (See Comments)    Has bleeding ulcers   Tape Itching and Rash    Paper tape can only be used on this patient   Zestril [Lisinopril] Swelling and Rash    Oral swelling Red streaks on arms/legs    Medications: I have reviewed the patient's current medications.  Results for orders placed or performed during the hospital encounter of 12/11/22 (from the past 48 hour(s))  Prepare RBC (crossmatch)     Status: None   Collection Time: 12/11/22  5:32 PM  Result Value Ref Range   Order Confirmation      ORDER PROCESSED BY BLOOD BANK Performed at [facility], [address], [place], [place] [address]   CBC with Differential     Status: Abnormal   Collection Time: 12/11/22  5:37 PM  Result Value Ref Range   WBC 9.5 4.0 - 10.5 K/uL   RBC 2.55 (L) 3.87 - 5.11 MIL/uL   Hemoglobin 7.8 (L) 12.0 - 15.0 g/dL   HCT 24.9 (L) 36.0 - 46.0 %   MCV 97.6 80.0 - 100.0 fL   MCH 30.6 26.0 - 34.0 pg   MCHC 31.3 30.0 - 36.0 g/dL   RDW 17.3 (H) 11.5 - 15.5 %   Platelets 255 150 - 400 K/uL   nRBC 0.0 0.0 - 0.2 %   Neutrophils Relative % 83 %    Neutro Abs 7.9 (H) 1.7 - 7.7 K/uL   Lymphocytes Relative 6 %   Lymphs Abs 0.6 (L) 0.7 - 4.0 K/uL   Monocytes Relative 4 %   Monocytes Absolute 0.3 0.1 - 1.0 K/uL   Eosinophils Relative 1 %   Eosinophils Absolute  0.1 0.0 - 0.5 K/uL   Basophils Relative 1 %   Basophils Absolute 0.1 0.0 - 0.1 K/uL   Immature Granulocytes 5 %   Abs Immature Granulocytes 0.50 (H) 0.00 - 0.07 K/uL    Comment: Performed at [facility], [address] [place], [place] [id number]  Basic metabolic panel     Status: Abnormal   Collection Time: 12/11/22  5:37 PM  Result Value Ref Range   Sodium 136 135 - 145 mmol/L   Potassium 5.5 (H) 3.5 - 5.1 mmol/L   Chloride 109 98 - 111 mmol/L   CO2 16 (L) 22 - 32 mmol/L   Glucose, Bld 211 (H) 70 - 99 mg/dL    Comment: Glucose reference range applies only to samples taken after fasting for at least 8 hours.   BUN 57 (H) 8 - 23 mg/dL   Creatinine, Ser 2.12 (H) 0.44 - 1.00 mg/dL   Calcium 9.0 8.9 - 10.3 mg/dL   GFR, Estimated 25 (L) >60 mL/min    Comment: (NOTE) Calculated using the CKD-EPI Creatinine Equation (2021)    Anion gap 11 5 - 15    Comment: Performed at [facility], [address] [place], [place] [address]  Protime-INR     Status: None   Collection Time: 12/11/22  5:37 PM  Result Value Ref Range   Prothrombin Time 13.8 11.4 - 15.2 seconds   INR 1.1 0.8 - 1.2    Comment: (NOTE) INR goal varies based on device and disease states. Performed at [facility], [address] [place], [place] [address]   CBG monitoring, ED     Status: Abnormal   Collection Time: 12/11/22  5:58 PM  Result Value Ref Range   Glucose-Capillary 181 (H) 70 - 99 mg/dL    Comment: Glucose reference range applies only to samples taken after fasting for at least 8 hours.  Type and screen Ordered by PROVIDER DEFAULT     Status: None   Collection Time: 12/11/22  6:48 PM  Result Value Ref Range   ABO/RH(D) O POS    Antibody Screen NEG    Sample Expiration 12/14/2022,2359    Unit  Number [id number]    Blood Component Type RED CELLS,LR    Unit division 00    Status of Unit ISSUED,FINAL    Transfusion Status OK TO TRANSFUSE    Crossmatch Result      Compatible Performed at [facility], [address], [place], [place] [address]   Glucose, capillary     Status: Abnormal   Collection Time: 12/12/22  1:05 AM  Result Value Ref Range   Glucose-Capillary 104 (H) 70 - 99 mg/dL    Comment: Glucose reference range applies only to samples taken after fasting for at least 8 hours.  Basic metabolic panel     Status: Abnormal   Collection Time: 12/12/22  2:53 AM  Result Value Ref Range   Sodium 136 135 - 145 mmol/L   Potassium 5.3 (H) 3.5 - 5.1 mmol/L   Chloride 112 (H) 98 - 111 mmol/L   CO2 15 (L) 22 - 32 mmol/L   Glucose, Bld 79 70 - 99 mg/dL    Comment: Glucose reference range applies only to samples taken after fasting for at least 8 hours.   BUN 60 (H) 8 - 23 mg/dL   Creatinine, Ser 2.30 (H) 0.44 - 1.00 mg/dL   Calcium 8.5 (L) 8.9 - 10.3 mg/dL   GFR, Estimated 22 (L) >60 mL/min  Comment: (NOTE) Calculated using the CKD-EPI Creatinine Equation (2021)    Anion gap 9 5 - 15    Comment: Performed at [facility], [place] [address], [place], [place] [address]  CBC     Status: Abnormal   Collection Time: 12/12/22  2:53 AM  Result Value Ref Range   WBC 10.2 4.0 - 10.5 K/uL   RBC 2.57 (L) 3.87 - 5.11 MIL/uL   Hemoglobin 7.8 (L) 12.0 - 15.0 g/dL   HCT 24.3 (L) 36.0 - 46.0 %   MCV 94.6 80.0 - 100.0 fL   MCH 30.4 26.0 - 34.0 pg   MCHC 32.1 30.0 - 36.0 g/dL   RDW 17.1 (H) 11.5 - 15.5 %   Platelets 269 150 - 400 K/uL   nRBC 0.3 (H) 0.0 - 0.2 %    Comment: Performed at [facility], [place] [address] [place], [place] [address]  Glucose, capillary     Status: Abnormal   Collection Time: 12/12/22  9:04 AM  Result Value Ref Range   Glucose-Capillary 66 (L) 70 - 99 mg/dL    Comment: Glucose reference range applies only to samples taken after fasting for at  least 8 hours.    CT Cervical Spine Wo Contrast  Result Date: 12/11/2022 CLINICAL DATA:  Blunt trauma due to a fall. EXAM: CT CERVICAL SPINE WITHOUT CONTRAST TECHNIQUE: Multidetector CT imaging of the cervical spine was performed without intravenous contrast. Multiplanar CT image reconstructions were also generated. RADIATION DOSE REDUCTION: This exam was performed according to the departmental dose-optimization program which includes automated exposure control, adjustment of the mA and/or kV according to patient size and/or use of iterative reconstruction technique. COMPARISON:  11/11/2022 FINDINGS: Alignment: Normal alignment. Skull base and vertebrae: Skull base appears intact. No vertebral compression deformities. No focal bone lesion or bone destruction. Soft tissues and spinal canal: No prevertebral soft tissue swelling. No abnormal paraspinal soft tissue mass or infiltration. Vascular calcifications. Disc levels: Postoperative changes with anterior plate and screw fixation at C3-4 and C7-T1. Posterior rod and screw fixation at C7-T1. Intervertebral disc fusion at C3-4, C4-5, C5-6, C6-7, and C7-T1 levels. Degenerative changes throughout the posterior facet joints. Upper chest: Lung apices are clear. Other: Appearances are similar to prior study. IMPRESSION: 1. Normal alignment.  No acute displaced fractures are identified. 2. Postoperative changes with intervertebral disc fusion throughout the cervical spine and internal fixation as discussed above. Electronically Signed   By: Lucienne Capers M.D.   On: 12/11/2022 18:03   CT Head Wo Contrast  Result Date: 12/11/2022 CLINICAL DATA:  Ataxia.  Head trauma.  Patient fell at home. EXAM: CT HEAD WITHOUT CONTRAST TECHNIQUE: Contiguous axial images were obtained from the base of the skull through the vertex without intravenous contrast. RADIATION DOSE REDUCTION: This exam was performed according to the departmental dose-optimization program which includes  automated exposure control, adjustment of the mA and/or kV according to patient size and/or use of iterative reconstruction technique. COMPARISON:  CT head 11/11/2022.  MRI brain 08/23/2022 FINDINGS: Brain: Mild diffuse cerebral atrophy. Low-attenuation changes in the deep white matter consistent small vessel ischemia. No mass-effect or midline shift. No abnormal extra-axial fluid collections. Gray-white matter junctions are mostly distinct. Basal cisterns are not effaced. No acute intracranial hemorrhage. Vascular: Diffuse vascular calcification. Skull: Calvarium appears intact. Sinuses/Orbits: Motion artifact limits evaluation but paranasal sinuses and mastoid air cells appear clear. Other: None. IMPRESSION: No acute intracranial abnormalities. Chronic atrophy and small vessel ischemic changes. Electronically Signed  By: Lucienne Capers M.D.   On: 12/11/2022 17:59   DG FEMUR, MIN 2 VIEWS RIGHT  Result Date: 12/11/2022 CLINICAL DATA:  [place] today with pain EXAM: RIGHT FEMUR 2 VIEWS COMPARISON:  11/28/2021 FINDINGS: No change. Previous gamma nail placement for treatment of distal intertrochanteric fracture. No evidence of recent injury in that area. No advanced osteoarthritis of the right hip. Acute displaced oblique fracture of the distal femur at the diaphyseal metaphyseal junction. IMPRESSION: Displaced oblique fracture of the distal femur at the diaphyseal metaphyseal junction. Previous gamma nail placement for treatment of intertrochanteric fracture. Electronically Signed   By: Nelson Chimes M.D.   On: 12/11/2022 17:21   DG Chest Port 1 View  Result Date: 12/11/2022 CLINICAL DATA:  Hip fracture. EXAM: PORTABLE CHEST 1 VIEW COMPARISON:  08/22/2022 FINDINGS: The cardiac silhouette, mediastinal hilar contours are within normal limits. The lungs are clear. No pleural effusions or pneumothorax. Remote trauma involving the right shoulder. Cervical fusion hardware noted. IMPRESSION: No acute cardiopulmonary  findings. Electronically Signed   By: Marijo Sanes M.D.   On: 12/11/2022 17:16    Review of Systems  HENT:  Negative for ear discharge, ear pain, hearing loss and tinnitus.   Eyes:  Negative for photophobia and pain.  Respiratory:  Negative for cough and shortness of breath.   Cardiovascular:  Negative for chest pain.  Gastrointestinal:  Negative for abdominal pain, nausea and vomiting.  Genitourinary:  Negative for dysuria, flank pain, frequency and urgency.  Musculoskeletal:  Positive for arthralgias (Right knee). Negative for back pain, myalgias and neck pain.  Neurological:  Negative for dizziness and headaches.  Hematological:  Does not bruise/bleed easily.  Psychiatric/Behavioral:  The patient is not nervous/anxious.    Blood pressure (!) 112/47, pulse 79, temperature 97.6 F (36.4 C), resp. rate 18, height 5' 4"$  (1.626 m), weight 51.7 kg, SpO2 99 %. Physical Exam Constitutional:      General: Heather [name] is not in acute distress.    Appearance: Heather [name] is well-developed. Heather [name] is not diaphoretic.  HENT:     Head: Normocephalic and atraumatic.  Eyes:     General: No scleral icterus.       Right eye: No discharge.        Left eye: No discharge.     Conjunctiva/sclera: Conjunctivae normal.  Cardiovascular:     Rate and Rhythm: Normal rate and regular rhythm.  Pulmonary:     Effort: Pulmonary effort is normal. No respiratory distress.  Musculoskeletal:     Cervical back: Normal range of motion.     Comments: RLE No traumatic wounds, ecchymosis, or rash  Mod TTP knee  No ankle effusion  Sens DPN, SPN, TN intact  Motor EHL, ext, flex, evers 5/5  DP 1+, PT 1+, No significant edema  Skin:    General: Skin is warm and dry.  Neurological:     Mental Status: Heather [name] is alert.  Psychiatric:        Mood and Affect: Mood normal.        Behavior: Behavior normal.     Assessment/Plan: Right distal femur fracture -- Plan ORIF today with Dr. Doreatha Martin. Please keep NPO. Multiple medical problems  including diabetes mellitus, hypertension, CKD 4, and diastolic CHF -- per primary service    Lisette Abu, PA-C Orthopedic Surgery [phone] 12/12/2022, 9:24 AM

## 2022-12-12 NOTE — Progress Notes (Addendum)
PROGRESS NOTE  Heather [name] DJT:[id number] DOB: 11-04-1950 DOA: 12/11/2022 PCP: [name], MD   LOS: 1 day   Brief Narrative / Interim history: 72 year old female with DM2, HTN, CKD 4, chronic diastolic CHF comes into the hospital with a fall.  She has a history of frequent falls, she is supposed to use a walker ambulation, and on the day of admission she rolled out of bed and fell onto the floor.  She was unable to get up.  In the ED she was found to have right distal femur fracture.  She was admitted to the hospital and orthopedic surgery consulted.  Of note, she had a recent hospitalization in mid January for HHS with a right humeral neck fracture  Subjective / 24h Interval events: Sleeping, complains of pain, no chest discomfort, shortness of breath, no nausea or vomiting.  Assesement and Plan: Principal Problem:   Fracture Active Problems:   Recurrent falls   Uncontrolled type 2 diabetes mellitus with hyperglycemia, with long-term current use of insulin (HCC)   Hypertension   Chronic diastolic CHF (congestive heart failure) (HCC)   Chronic kidney disease (CKD), stage IV (severe) (HCC)   Anemia due to chronic kidney disease  Principal problem Right distal femur fracture -awaiting orthopedic surgery input, likely needs operative repair.  Keep n.p.o. until evaluated by surgery  Active problems Essential hypertension-continue home metoprolol  Chronic diastolic CHF -appears euvolemic, last echo October 2022, EF 60-65%  CKD stage IV -variable baseline, 2.5-3.5.  Currently within her acceptable baseline  UTI - UA suggestive on infection and she is symptomatic with pain / burning with urination. Send culture and start Ceftriaxone.   Hyperkalemia-mild, due to chronic kidney disease  Anemia of chronic renal disease -hemoglobin overall stable, no bleeding.  Monitor postoperatively.  DM2, uncontrolled, with hyperglycemia -A1c over 15.  Continue sliding scale every 4 while  n.p.o.  CBG (last 3)  Recent Labs    12/11/22 1758 12/12/22 0105 12/12/22 0904  GLUCAP 181* 104* 66*    Scheduled Meds:  allopurinol  100 mg Oral Daily   atorvastatin  80 mg Oral Daily   gabapentin  300 mg Oral QHS   hydrALAZINE  50 mg Oral Q8H   insulin aspart  0-15 Units Subcutaneous Q4H   levETIRAcetam  500 mg Oral BID   lipase/protease/amylase  36,000 Units Oral TID WC   metoprolol succinate  37.5 mg Oral Daily   venlafaxine XR  150 mg Oral Daily   Continuous Infusions:  sodium chloride 75 mL/hr at 12/12/22 0100   PRN Meds:.acetaminophen **OR** acetaminophen, HYDROmorphone (DILAUDID) injection, polyethylene glycol  Current Outpatient Medications  Medication Instructions   acetaminophen (TYLENOL) 650 mg, Oral, Every 4 hours PRN   allopurinol (ZYLOPRIM) 100 mg, Oral, Daily   atorvastatin (LIPITOR) 80 mg, Oral, Daily, Restart on 06/29/22   diphenhydrAMINE (BENADRYL) 2 % cream Topical, 3 times daily PRN   furosemide (LASIX) 40 MG tablet TAKE 1 TABLET BY MOUTH ONCE DAILY ALONG WITH '20MG'$  TABLET FOR INCREASED FLUID RETENTION   gabapentin (NEURONTIN) 300 MG capsule 1 capsule, Oral, Daily at bedtime   hydrALAZINE (APRESOLINE) 50 mg, Oral, Every 8 hours   insulin aspart (NOVOLOG) 5 Units, Subcutaneous, 3 times daily with meals, Give only if eats 50% or more of meal.   insulin glargine-yfgn (SEMGLEE) 100 UNIT/ML Pen INJECT 15 UNITS UNDER THE SKIN ONCE DAILY   levETIRAcetam (KEPPRA) 500 mg, Oral, 2 times daily   lipase/protease/amylase (CREON) 36,000 Units, Oral, 3 times daily with meals  methocarbamol (ROBAXIN) 500 mg, Oral, Every 8 hours PRN   metoprolol succinate (TOPROL-XL) 37.5 mg, Oral, Daily   naloxone (NARCAN) nasal spray 4 mg/0.1 mL 1 spray, Nasal,  Once   naproxen sodium (ALEVE) 440 mg, Oral, Daily PRN   polyethylene glycol (MIRALAX / GLYCOLAX) 17 g, Oral, Daily   traMADol (ULTRAM) 50 mg, Oral, 2 times daily   venlafaxine XR (EFFEXOR-XR) 150 mg, Oral, Daily    Diet  Orders (From admission, onward)     Start     Ordered   12/12/22 0001  Diet NPO time specified  Diet effective midnight        12/11/22 2200            DVT prophylaxis: SCDs Start: 12/12/22 0033   Lab Results  Component Value Date   PLT 269 12/12/2022      Code Status: Full Code  Family Communication: no family at bedside   Status is: Inpatient  Remains inpatient appropriate because: severity of illness  Level of care: Med-Surg  Consultants:  Orthopedic surgery   Objective: Vitals:   12/12/22 0506 12/12/22 0619 12/12/22 0903 12/12/22 0905  BP: (!) 101/47 (!) 149/49 (!) 104/54 (!) 112/47  Pulse: 81 93 84 79  Resp: '19  18 18  '$ Temp: 98.1 F (36.7 C)  98 F (36.7 C) 97.6 F (36.4 C)  TempSrc: Oral     SpO2: 99%  100% 99%  Weight:      Height:        Intake/Output Summary (Last 24 hours) at 12/12/2022 1005 Last data filed at 12/11/2022 2230 Gross per 24 hour  Intake 315 ml  Output --  Net 315 ml   Wt Readings from Last 3 Encounters:  12/11/22 51.7 kg  11/12/22 51.3 kg  11/01/22 51.6 kg    Examination:  Constitutional: NAD Eyes: no scleral icterus ENMT: Mucous membranes are moist.  Neck: normal, supple Respiratory: clear to auscultation bilaterally, no wheezing, no crackles. Normal respiratory effort.  Cardiovascular: Regular rate and rhythm, no murmurs / rubs / gallops.  Abdomen: non distended, no tenderness. Bowel sounds positive.  Musculoskeletal: no clubbing / cyanosis.   Data Reviewed: I have independently reviewed following labs and imaging studies   CBC Recent Labs  Lab 12/11/22 1737 12/12/22 0253  WBC 9.5 10.2  HGB 7.8* 7.8*  HCT 24.9* 24.3*  PLT 255 269  MCV 97.6 94.6  MCH 30.6 30.4  MCHC 31.3 32.1  RDW 17.3* 17.1*  LYMPHSABS 0.6*  --   MONOABS 0.3  --   EOSABS 0.1  --   BASOSABS 0.1  --     Recent Labs  Lab 12/11/22 1737 12/12/22 0253  NA 136 136  K 5.5* 5.3*  CL 109 112*  CO2 16* 15*  GLUCOSE 211* 79  BUN 57*  60*  CREATININE 2.12* 2.30*  CALCIUM 9.0 8.5*  INR 1.1  --     ------------------------------------------------------------------------------------------------------------------ No results for input(s): "CHOL", "HDL", "LDLCALC", "TRIG", "CHOLHDL", "LDLDIRECT" in the last 72 hours.  Lab Results  Component Value Date   HGBA1C >15.5 (H) 10/17/2022   ------------------------------------------------------------------------------------------------------------------ No results for input(s): "TSH", "T4TOTAL", "T3FREE", "THYROIDAB" in the last 72 hours.  Invalid input(s): "FREET3"  Cardiac Enzymes No results for input(s): "CKMB", "TROPONINI", "MYOGLOBIN" in the last 168 hours.  Invalid input(s): "CK" ------------------------------------------------------------------------------------------------------------------    Component Value Date/Time   BNP 430.0 (H) 08/19/2021 0728    CBG: Recent Labs  Lab 12/11/22 1758 12/12/22 0105 12/12/22 0904  GLUCAP 181*  104* 66*    No results found for this or any previous visit (from the past 240 hour(s)).   Radiology Studies: CT Cervical Spine Wo Contrast  Result Date: 12/11/2022 CLINICAL DATA:  Blunt trauma due to a fall. EXAM: CT CERVICAL SPINE WITHOUT CONTRAST TECHNIQUE: Multidetector CT imaging of the cervical spine was performed without intravenous contrast. Multiplanar CT image reconstructions were also generated. RADIATION DOSE REDUCTION: This exam was performed according to the departmental dose-optimization program which includes automated exposure control, adjustment of the mA and/or kV according to patient size and/or use of iterative reconstruction technique. COMPARISON:  11/11/2022 FINDINGS: Alignment: Normal alignment. Skull base and vertebrae: Skull base appears intact. No vertebral compression deformities. No focal bone lesion or bone destruction. Soft tissues and spinal canal: No prevertebral soft tissue swelling. No abnormal  paraspinal soft tissue mass or infiltration. Vascular calcifications. Disc levels: Postoperative changes with anterior plate and screw fixation at C3-4 and C7-T1. Posterior rod and screw fixation at C7-T1. Intervertebral disc fusion at C3-4, C4-5, C5-6, C6-7, and C7-T1 levels. Degenerative changes throughout the posterior facet joints. Upper chest: Lung apices are clear. Other: Appearances are similar to prior study. IMPRESSION: 1. Normal alignment.  No acute displaced fractures are identified. 2. Postoperative changes with intervertebral disc fusion throughout the cervical spine and internal fixation as discussed above. Electronically Signed   By: Lucienne Capers M.D.   On: 12/11/2022 18:03   CT Head Wo Contrast  Result Date: 12/11/2022 CLINICAL DATA:  Ataxia.  Head trauma.  Patient fell at home. EXAM: CT HEAD WITHOUT CONTRAST TECHNIQUE: Contiguous axial images were obtained from the base of the skull through the vertex without intravenous contrast. RADIATION DOSE REDUCTION: This exam was performed according to the departmental dose-optimization program which includes automated exposure control, adjustment of the mA and/or kV according to patient size and/or use of iterative reconstruction technique. COMPARISON:  CT head 11/11/2022.  MRI brain 08/23/2022 FINDINGS: Brain: Mild diffuse cerebral atrophy. Low-attenuation changes in the deep white matter consistent small vessel ischemia. No mass-effect or midline shift. No abnormal extra-axial fluid collections. Gray-white matter junctions are mostly distinct. Basal cisterns are not effaced. No acute intracranial hemorrhage. Vascular: Diffuse vascular calcification. Skull: Calvarium appears intact. Sinuses/Orbits: Motion artifact limits evaluation but paranasal sinuses and mastoid air cells appear clear. Other: None. IMPRESSION: No acute intracranial abnormalities. Chronic atrophy and small vessel ischemic changes. Electronically Signed   By: Lucienne Capers M.D.    On: 12/11/2022 17:59   DG FEMUR, MIN 2 VIEWS RIGHT  Result Date: 12/11/2022 CLINICAL DATA:  [place] today with pain EXAM: RIGHT FEMUR 2 VIEWS COMPARISON:  11/28/2021 FINDINGS: No change. Previous gamma nail placement for treatment of distal intertrochanteric fracture. No evidence of recent injury in that area. No advanced osteoarthritis of the right hip. Acute displaced oblique fracture of the distal femur at the diaphyseal metaphyseal junction. IMPRESSION: Displaced oblique fracture of the distal femur at the diaphyseal metaphyseal junction. Previous gamma nail placement for treatment of intertrochanteric fracture. Electronically Signed   By: Nelson Chimes M.D.   On: 12/11/2022 17:21   DG Chest Port 1 View  Result Date: 12/11/2022 CLINICAL DATA:  Hip fracture. EXAM: PORTABLE CHEST 1 VIEW COMPARISON:  08/22/2022 FINDINGS: The cardiac silhouette, mediastinal hilar contours are within normal limits. The lungs are clear. No pleural effusions or pneumothorax. Remote trauma involving the right shoulder. Cervical fusion hardware noted. IMPRESSION: No acute cardiopulmonary findings. Electronically Signed   By: Marijo Sanes M.D.   On: 12/11/2022 17:16  Marzetta Board, MD, PhD [facility]  Between 7 am - 7 pm I am available, please contact me via Amion (for emergencies) or Securechat (non urgent messages)  Between 7 pm - 7 am I am not available, please contact night coverage MD/APP via Amion

## 2022-12-12 NOTE — Anesthesia Preprocedure Evaluation (Deleted)
Anesthesia Evaluation    Reviewed: Allergy & Precautions, Patient's Chart, lab work & pertinent test results  History of Anesthesia Complications Negative for: history of anesthetic complications  Airway        Dental   Pulmonary neg pulmonary ROS          Cardiovascular hypertension, Pt. on medications and Pt. on home beta blockers + CAD       Neuro/Psych  Headaches PSYCHIATRIC DISORDERS Anxiety Depression     Brain tumor Tinnitus  CVA    GI/Hepatic Neg liver ROS, hiatal hernia,GERD  ,,  Endo/Other  diabetes, Insulin Dependent    Renal/GU CRFRenal disease     Musculoskeletal  (+) Arthritis ,    Abdominal   Peds  Hematology negative hematology ROS (+)   Anesthesia Other Findings   Reproductive/Obstetrics                              Anesthesia Physical Anesthesia Plan  ASA: 3  Anesthesia Plan: General   Post-op Pain Management: Tylenol PO (pre-op)*   Induction: Intravenous  PONV Risk Score and Plan: 3 and Treatment may vary due to age or medical condition, Ondansetron and Dexamethasone  Airway Management Planned: LMA  Additional Equipment: None  Intra-op Plan:   Post-operative Plan: Extubation in OR  Informed Consent:   Plan Discussed with: CRNA and Anesthesiologist  Anesthesia Plan Comments:          Anesthesia Quick Evaluation

## 2022-12-12 NOTE — Plan of Care (Signed)
Problem: Education: Goal: Knowledge of General Education information will improve Description: Including pain rating scale, medication(s)/side effects and non-pharmacologic comfort measures 12/12/2022 2007 by Morene Rankins, LPN Outcome: Progressing 12/12/2022 0703 by Morene Rankins, LPN Outcome: Progressing   Problem: Health Behavior/Discharge Planning: Goal: Ability to manage health-related needs will improve 12/12/2022 2007 by Morene Rankins, LPN Outcome: Progressing 12/12/2022 0703 by Morene Rankins, LPN Outcome: Progressing   Problem: Clinical Measurements: Goal: Ability to maintain clinical measurements within normal limits will improve 12/12/2022 2007 by Morene Rankins, LPN Outcome: Progressing 12/12/2022 0703 by Morene Rankins, LPN Outcome: Progressing Goal: Will remain free from infection 12/12/2022 2007 by Morene Rankins, LPN Outcome: Progressing 12/12/2022 0703 by Morene Rankins, LPN Outcome: Progressing Goal: Diagnostic test results will improve 12/12/2022 2007 by Morene Rankins, LPN Outcome: Progressing 12/12/2022 0703 by Morene Rankins, LPN Outcome: Progressing Goal: Respiratory complications will improve 12/12/2022 2007 by Morene Rankins, LPN Outcome: Progressing 12/12/2022 0703 by Morene Rankins, LPN Outcome: Progressing Goal: Cardiovascular complication will be avoided 12/12/2022 2007 by Morene Rankins, LPN Outcome: Progressing 12/12/2022 0703 by Morene Rankins, LPN Outcome: Progressing   Problem: Activity: Goal: Risk for activity intolerance will decrease 12/12/2022 2007 by Morene Rankins, LPN Outcome: Progressing 12/12/2022 0703 by Morene Rankins, LPN Outcome: Progressing   Problem: Nutrition: Goal: Adequate nutrition will be maintained 12/12/2022 2007 by Morene Rankins, LPN Outcome: Progressing 12/12/2022 0703 by Morene Rankins, LPN Outcome: Progressing   Problem: Coping: Goal: Level of anxiety will decrease 12/12/2022  2007 by Morene Rankins, LPN Outcome: Progressing 12/12/2022 0703 by Morene Rankins, LPN Outcome: Progressing   Problem: Elimination: Goal: Will not experience complications related to bowel motility 12/12/2022 2007 by Morene Rankins, LPN Outcome: Progressing 12/12/2022 0703 by Morene Rankins, LPN Outcome: Progressing Goal: Will not experience complications related to urinary retention 12/12/2022 2007 by Morene Rankins, LPN Outcome: Progressing 12/12/2022 0703 by Morene Rankins, LPN Outcome: Progressing   Problem: Pain Managment: Goal: General experience of comfort will improve 12/12/2022 2007 by Morene Rankins, LPN Outcome: Progressing 12/12/2022 0703 by Morene Rankins, LPN Outcome: Progressing   Problem: Safety: Goal: Ability to remain free from injury will improve 12/12/2022 2007 by Morene Rankins, LPN Outcome: Progressing 12/12/2022 0703 by Morene Rankins, LPN Outcome: Progressing   Problem: Skin Integrity: Goal: Risk for impaired skin integrity will decrease 12/12/2022 2007 by Morene Rankins, LPN Outcome: Progressing 12/12/2022 0703 by Morene Rankins, LPN Outcome: Progressing   Problem: Education: Goal: Ability to describe self-care measures that may prevent or decrease complications (Diabetes Survival Skills Education) will improve 12/12/2022 2007 by Morene Rankins, LPN Outcome: Progressing 12/12/2022 0703 by Morene Rankins, LPN Outcome: Progressing Goal: Individualized Educational Video(s) 12/12/2022 2007 by Morene Rankins, LPN Outcome: Progressing 12/12/2022 0703 by Morene Rankins, LPN Outcome: Progressing   Problem: Coping: Goal: Ability to adjust to condition or change in health will improve 12/12/2022 2007 by Morene Rankins, LPN Outcome: Progressing 12/12/2022 0703 by Morene Rankins, LPN Outcome: Progressing   Problem: Fluid Volume: Goal: Ability to maintain a balanced intake and output will improve 12/12/2022 2007 by Morene Rankins, LPN Outcome: Progressing 12/12/2022 0703 by Morene Rankins, LPN Outcome: Progressing   Problem: Health Behavior/Discharge Planning: Goal: Ability to identify and utilize available resources and services will improve 12/12/2022 2007 by Morene Rankins, LPN Outcome: Progressing 12/12/2022 0703 by Morene Rankins,  LPN Outcome: Progressing Goal: Ability to manage health-related needs will improve 12/12/2022 2007 by Morene Rankins, LPN Outcome: Progressing 12/12/2022 0703 by Morene Rankins, LPN Outcome: Progressing   Problem: Metabolic: Goal: Ability to maintain appropriate glucose levels will improve 12/12/2022 2007 by Morene Rankins, LPN Outcome: Progressing 12/12/2022 0703 by Morene Rankins, LPN Outcome: Progressing   Problem: Nutritional: Goal: Maintenance of adequate nutrition will improve 12/12/2022 2007 by Morene Rankins, LPN Outcome: Progressing 12/12/2022 0703 by Morene Rankins, LPN Outcome: Progressing Goal: Progress toward achieving an optimal weight will improve 12/12/2022 2007 by Morene Rankins, LPN Outcome: Progressing 12/12/2022 0703 by Morene Rankins, LPN Outcome: Progressing   Problem: Skin Integrity: Goal: Risk for impaired skin integrity will decrease 12/12/2022 2007 by Morene Rankins, LPN Outcome: Progressing 12/12/2022 0703 by Morene Rankins, LPN Outcome: Progressing   Problem: Tissue Perfusion: Goal: Adequacy of tissue perfusion will improve 12/12/2022 2007 by Morene Rankins, LPN Outcome: Progressing 12/12/2022 0703 by Morene Rankins, LPN Outcome: Progressing

## 2022-12-12 NOTE — Progress Notes (Signed)
Confirmed with PA Jacqulynn Cadet that patient will not be having surgery today. Diet resumed.

## 2022-12-12 NOTE — Progress Notes (Addendum)
Received pt from [facility], alert and oriented x 2-3, with confusion. I called her husband Zaidah Brisky to get a consent for surgery. He does not want Dr Doreatha Martin to do the surgery. He wants Dr Alvan Dame to do the surgery.  Dr Doreatha Martin was notified by OR staff and said to bring pt back to [facility].  Transported pt back to [facility], report was given to [employer] at [employer].  [facility] peripheral IV insertion with no success. Will consult IV team as reported to [facility]

## 2022-12-12 NOTE — Progress Notes (Signed)
Orthopedic Tech Progress Note Patient Details:  Heather [name] Feb 28, 1951 [id number]  Ortho Devices Type of Ortho Device: Knee Immobilizer Ortho Device/Splint Location: RLE Ortho Device/Splint Interventions: Ordered, Application, Adjustment   Post Interventions Patient Tolerated: Well, Fair Instructions Provided: Care of device  Janit Pagan 12/12/2022, 3:28 PM

## 2022-12-13 DIAGNOSIS — T148XXA Other injury of unspecified body region, initial encounter: Secondary | ICD-10-CM | POA: Diagnosis not present

## 2022-12-13 LAB — COMPREHENSIVE METABOLIC PANEL
ALT: 17 U/L (ref 0–44)
AST: 17 U/L (ref 15–41)
Albumin: 2.1 g/dL — ABNORMAL LOW (ref 3.5–5.0)
Alkaline Phosphatase: 105 U/L (ref 38–126)
Anion gap: 11 (ref 5–15)
BUN: 64 mg/dL — ABNORMAL HIGH (ref 8–23)
CO2: 13 mmol/L — ABNORMAL LOW (ref 22–32)
Calcium: 8.3 mg/dL — ABNORMAL LOW (ref 8.9–10.3)
Chloride: 110 mmol/L (ref 98–111)
Creatinine, Ser: 2.66 mg/dL — ABNORMAL HIGH (ref 0.44–1.00)
GFR, Estimated: 19 mL/min — ABNORMAL LOW (ref >60)
Glucose, Bld: 125 mg/dL — ABNORMAL HIGH (ref 70–99)
Potassium: 4.8 mmol/L (ref 3.5–5.1)
Sodium: 134 mmol/L — ABNORMAL LOW (ref 135–145)
Total Bilirubin: 0.5 mg/dL (ref 0.3–1.2)
Total Protein: 5 g/dL — ABNORMAL LOW (ref 6.5–8.1)

## 2022-12-13 LAB — GLUCOSE, CAPILLARY
Glucose-Capillary: 125 mg/dL — ABNORMAL HIGH (ref 70–99)
Glucose-Capillary: 143 mg/dL — ABNORMAL HIGH (ref 70–99)
Glucose-Capillary: 144 mg/dL — ABNORMAL HIGH (ref 70–99)
Glucose-Capillary: 163 mg/dL — ABNORMAL HIGH (ref 70–99)
Glucose-Capillary: 180 mg/dL — ABNORMAL HIGH (ref 70–99)
Glucose-Capillary: 187 mg/dL — ABNORMAL HIGH (ref 70–99)

## 2022-12-13 LAB — URINE CULTURE: Culture: >=100000 — AB

## 2022-12-13 LAB — HEMOGLOBIN AND HEMATOCRIT, BLOOD
HCT: 24.2 % — ABNORMAL LOW (ref 36.0–46.0)
Hemoglobin: 8.3 g/dL — ABNORMAL LOW (ref 12.0–15.0)

## 2022-12-13 LAB — CBC
HCT: 19.5 % — ABNORMAL LOW (ref 36.0–46.0)
Hemoglobin: 6.6 g/dL — CL (ref 12.0–15.0)
MCH: 31.4 pg (ref 26.0–34.0)
MCHC: 33.8 g/dL (ref 30.0–36.0)
MCV: 92.9 fL (ref 80.0–100.0)
Platelets: 181 10*3/uL (ref 150–400)
RBC: 2.1 MIL/uL — ABNORMAL LOW (ref 3.87–5.11)
RDW: 17.3 % — ABNORMAL HIGH (ref 11.5–15.5)
WBC: 11.5 10*3/uL — ABNORMAL HIGH (ref 4.0–10.5)
nRBC: 0.2 % (ref 0.0–0.2)

## 2022-12-13 LAB — PREPARE RBC (CROSSMATCH)

## 2022-12-13 LAB — MAGNESIUM: Magnesium: 1.2 mg/dL — ABNORMAL LOW (ref 1.7–2.4)

## 2022-12-13 MED ORDER — CHLORHEXIDINE GLUCONATE CLOTH 2 % EX PADS
6.0000 | MEDICATED_PAD | Freq: Every day | CUTANEOUS | Status: DC
  Administered 2022-12-14 – 2022-12-18 (×5): 6 via TOPICAL

## 2022-12-13 MED ORDER — SODIUM CHLORIDE 0.9% IV SOLUTION: Freq: Once | INTRAVENOUS | Status: AC

## 2022-12-13 MED ORDER — MAGNESIUM SULFATE 2 GM/50ML IV SOLN
2.0000 g | Freq: Once | INTRAVENOUS | Status: AC
  Administered 2022-12-13: 2 g via INTRAVENOUS
  Filled 2022-12-13: qty 50

## 2022-12-13 NOTE — Progress Notes (Signed)
PROGRESS NOTE  Heather [name] [id number] DOB: April 07, 1951 DOA: 12/11/2022 PCP: [name], MD   LOS: 2 days   Brief Narrative / Interim history: 72 year old female with DM2, HTN, CKD 4, chronic diastolic CHF comes into the hospital with a fall.  She has a history of frequent falls, she is supposed to use a walker ambulation, and on the day of admission she rolled out of bed and fell onto the floor.  She was unable to get up.  In the ED she was found to have right distal femur fracture.  She was admitted to the hospital and orthopedic surgery consulted.  Of note, she had a recent hospitalization in mid January for HHS with a right humeral neck fracture  Subjective / 24h Interval events: Complains of hip pain, otherwise no chest pain, no shortness of breath, no abdominal discomfort, nausea or vomiting.  Assesement and Plan: Principal Problem:   Fracture Active Problems:   Recurrent falls   Uncontrolled type 2 diabetes mellitus with hyperglycemia, with long-term current use of insulin (HCC)   Hypertension   Chronic diastolic CHF (congestive heart failure) (HCC)   Chronic kidney disease (CKD), stage IV (severe) (HCC)   Anemia due to chronic kidney disease  Principal problem Right distal femur fracture -orthopedic surgery consult, appreciate input.  Looks like patient and her family requesting Dr. Alvan Dame, discussed with Hilbert Odor, PA yesterday and the orthopedics groups will discuss and determine timing of surgery and who is going to do the surgery  Active problems Essential hypertension-continue home metoprolol, hydralazine, blood pressure is stable  Chronic diastolic CHF -appears euvolemic, last echo October 2022, EF 60-65%  CKD stage IV -variable baseline, 2.5-3.5.  Currently within her acceptable baseline  UTI - UA suggestive on infection and she is symptomatic with pain / burning with urination.  Continue ceftriaxone today day #2, monitor  cultures  Hypomagnesemia-replace magnesium today  Hyperkalemia-mild, due to chronic kidney disease.  Potassium better today  Anemia of chronic renal disease -there is no bleeding, hemoglobin dipped to 6.6 this morning.  Transfuse unit of packed red blood cells  DM2, uncontrolled, with hyperglycemia -A1c over 15.  CBGs controlled  CBG (last 3)  Recent Labs    12/13/22 0321 12/13/22 0801 12/13/22 1118  GLUCAP 125* 143* 187*     Scheduled Meds:  allopurinol  100 mg Oral Daily   atorvastatin  80 mg Oral Daily   gabapentin  300 mg Oral QHS   hydrALAZINE  50 mg Oral Q8H   insulin aspart  0-15 Units Subcutaneous Q4H   levETIRAcetam  500 mg Oral BID   lipase/protease/amylase  36,000 Units Oral TID WC   metoprolol succinate  37.5 mg Oral Daily   venlafaxine XR  150 mg Oral Daily   Continuous Infusions:  cefTRIAXone (ROCEPHIN)  IV 1 g (12/12/22 1618)   magnesium sulfate bolus IVPB     PRN Meds:.acetaminophen **OR** acetaminophen, HYDROmorphone (DILAUDID) injection, polyethylene glycol  Current Outpatient Medications  Medication Instructions   acetaminophen (TYLENOL) 650 mg, Oral, Every 4 hours PRN   allopurinol (ZYLOPRIM) 100 mg, Oral, Daily   atorvastatin (LIPITOR) 80 mg, Oral, Daily, Restart on 06/29/22   diphenhydrAMINE (BENADRYL) 2 % cream Topical, 3 times daily PRN   furosemide (LASIX) 40 MG tablet TAKE 1 TABLET BY MOUTH ONCE DAILY ALONG WITH 20MG TABLET FOR INCREASED FLUID RETENTION   gabapentin (NEURONTIN) 300 MG capsule 1 capsule, Oral, Daily at bedtime   hydrALAZINE (APRESOLINE) 50 mg, Oral, Every 8 hours  insulin aspart (NOVOLOG) 5 Units, Subcutaneous, 3 times daily with meals, Give only if eats 50% or more of meal.   insulin glargine-yfgn (SEMGLEE) 100 UNIT/ML Pen INJECT 15 UNITS UNDER THE SKIN ONCE DAILY   levETIRAcetam (KEPPRA) 500 mg, Oral, 2 times daily   lipase/protease/amylase (CREON) 36,000 Units, Oral, 3 times daily with meals   methocarbamol (ROBAXIN) 500  mg, Oral, Every 8 hours PRN   metoprolol succinate (TOPROL-XL) 37.5 mg, Oral, Daily   naloxone (NARCAN) nasal spray 4 mg/0.1 mL 1 spray, Nasal,  Once   naproxen sodium (ALEVE) 440 mg, Oral, Daily PRN   polyethylene glycol (MIRALAX / GLYCOLAX) 17 g, Oral, Daily   traMADol (ULTRAM) 50 mg, Oral, 2 times daily   venlafaxine XR (EFFEXOR-XR) 150 mg, Oral, Daily    Diet Orders (From admission, onward)     Start     Ordered   12/12/22 1426  Diet heart healthy/carb modified Room service appropriate? No; Fluid consistency: Thin  Diet effective now       Question Answer Comment  Diet-HS Snack? Nothing   Room service appropriate? No   Fluid consistency: Thin      12/12/22 1425            DVT prophylaxis: SCDs Start: 12/12/22 0033   Lab Results  Component Value Date   PLT 181 12/13/2022      Code Status: Full Code  Family Communication: no family at bedside   Status is: Inpatient  Remains inpatient appropriate because: severity of illness  Level of care: Med-Surg  Consultants:  Orthopedic surgery   Objective: Vitals:   12/13/22 0801 12/13/22 1000 12/13/22 1018 12/13/22 1030  BP: (!) 152/58 (!) 131/55  (!) 131/49  Pulse: (!) 112 (!) 103  100  Resp: 18 18  18  $ Temp: 97.9 F (36.6 C)  98.1 F (36.7 C) 98.1 F (36.7 C)  TempSrc: Oral Oral  Oral  SpO2: 100% 99%  99%  Weight:      Height:        Intake/Output Summary (Last 24 hours) at 12/13/2022 1130 Last data filed at 12/13/2022 0800 Gross per 24 hour  Intake 1084.5 ml  Output --  Net 1084.5 ml    Wt Readings from Last 3 Encounters:  12/12/22 52 kg  11/12/22 51.3 kg  11/01/22 51.6 kg    Examination:  Constitutional: NAD Eyes: lids and conjunctivae normal, no scleral icterus ENMT: mmm Neck: normal, supple Respiratory: clear to auscultation bilaterally, no wheezing, no crackles. Normal respiratory effort.  Cardiovascular: Regular rate and rhythm, no murmurs / rubs / gallops. No LE edema. Abdomen: soft,  no distention, no tenderness. Bowel sounds positive.  Skin: no rashes Neurologic: no focal deficits, equal strength  Data Reviewed: I have independently reviewed following labs and imaging studies   CBC Recent Labs  Lab 12/11/22 1737 12/12/22 0253 12/13/22 0445  WBC 9.5 10.2 11.5*  HGB 7.8* 7.8* 6.6*  HCT 24.9* 24.3* 19.5*  PLT 255 269 181  MCV 97.6 94.6 92.9  MCH 30.6 30.4 31.4  MCHC 31.3 32.1 33.8  RDW 17.3* 17.1* 17.3*  LYMPHSABS 0.6*  --   --   MONOABS 0.3  --   --   EOSABS 0.1  --   --   BASOSABS 0.1  --   --      Recent Labs  Lab 12/11/22 1737 12/12/22 0253 12/13/22 0445  NA 136 136 134*  K 5.5* 5.3* 4.8  CL 109 112* 110  CO2 16*  15* 13*  GLUCOSE 211* 79 125*  BUN 57* 60* 64*  CREATININE 2.12* 2.30* 2.66*  CALCIUM 9.0 8.5* 8.3*  AST  --   --  17  ALT  --   --  17  ALKPHOS  --   --  105  BILITOT  --   --  0.5  ALBUMIN  --   --  2.1*  MG  --   --  1.2*  INR 1.1  --   --      ------------------------------------------------------------------------------------------------------------------ No results for input(s): "CHOL", "HDL", "LDLCALC", "TRIG", "CHOLHDL", "LDLDIRECT" in the last 72 hours.  Lab Results  Component Value Date   HGBA1C >15.5 (H) 10/17/2022   ------------------------------------------------------------------------------------------------------------------ No results for input(s): "TSH", "T4TOTAL", "T3FREE", "THYROIDAB" in the last 72 hours.  Invalid input(s): "FREET3"  Cardiac Enzymes No results for input(s): "CKMB", "TROPONINI", "MYOGLOBIN" in the last 168 hours.  Invalid input(s): "CK" ------------------------------------------------------------------------------------------------------------------    Component Value Date/Time   BNP 430.0 (H) 08/19/2021 0728    CBG: Recent Labs  Lab 12/12/22 2222 12/13/22 0016 12/13/22 0321 12/13/22 0801 12/13/22 1118  GLUCAP 231* 180* 125* 143* 187*     No results found for this  or any previous visit (from the past 240 hour(s)).   Radiology Studies: No results found.   Marzetta Board, MD, PhD [facility]  Between 7 am - 7 pm I am available, please contact me via Amion (for emergencies) or Securechat (non urgent messages)  Between 7 pm - 7 am I am not available, please contact night coverage MD/APP via Amion

## 2022-12-14 ENCOUNTER — Encounter (HOSPITAL_COMMUNITY): Payer: Self-pay | Admitting: Internal Medicine

## 2022-12-14 ENCOUNTER — Inpatient Hospital Stay (HOSPITAL_COMMUNITY): Payer: Medicare PPO | Admitting: Anesthesiology

## 2022-12-14 ENCOUNTER — Inpatient Hospital Stay (HOSPITAL_COMMUNITY): Payer: Medicare PPO

## 2022-12-14 ENCOUNTER — Other Ambulatory Visit: Payer: Self-pay

## 2022-12-14 ENCOUNTER — Encounter (HOSPITAL_COMMUNITY)
Admission: EM | Disposition: A | Discharge status: Skilled Nursing Facility | Payer: Self-pay | Source: Home / Self Care | Attending: Internal Medicine

## 2022-12-14 DIAGNOSIS — I2510 Atherosclerotic heart disease of native coronary artery without angina pectoris: Secondary | ICD-10-CM

## 2022-12-14 DIAGNOSIS — S72401A Unspecified fracture of lower end of right femur, initial encounter for closed fracture: Secondary | ICD-10-CM | POA: Diagnosis not present

## 2022-12-14 DIAGNOSIS — I110 Hypertensive heart disease with heart failure: Secondary | ICD-10-CM | POA: Diagnosis not present

## 2022-12-14 DIAGNOSIS — T148XXA Other injury of unspecified body region, initial encounter: Secondary | ICD-10-CM | POA: Diagnosis not present

## 2022-12-14 DIAGNOSIS — I509 Heart failure, unspecified: Secondary | ICD-10-CM

## 2022-12-14 HISTORY — PX: ORIF FEMUR FRACTURE: SHX2119

## 2022-12-14 LAB — GLUCOSE, CAPILLARY
Glucose-Capillary: 101 mg/dL — ABNORMAL HIGH (ref 70–99)
Glucose-Capillary: 105 mg/dL — ABNORMAL HIGH (ref 70–99)
Glucose-Capillary: 105 mg/dL — ABNORMAL HIGH (ref 70–99)
Glucose-Capillary: 106 mg/dL — ABNORMAL HIGH (ref 70–99)
Glucose-Capillary: 126 mg/dL — ABNORMAL HIGH (ref 70–99)
Glucose-Capillary: 139 mg/dL — ABNORMAL HIGH (ref 70–99)
Glucose-Capillary: 185 mg/dL — ABNORMAL HIGH (ref 70–99)

## 2022-12-14 SURGERY — OPEN REDUCTION INTERNAL FIXATION (ORIF) DISTAL FEMUR FRACTURE
Anesthesia: General | Site: Leg Upper | Laterality: Right

## 2022-12-14 MED ORDER — PHENYLEPHRINE 80 MCG/ML (10ML) SYRINGE FOR IV PUSH (FOR BLOOD PRESSURE SUPPORT)
PREFILLED_SYRINGE | INTRAVENOUS | Status: AC
  Filled 2022-12-14: qty 10

## 2022-12-14 MED ORDER — ONDANSETRON HCL 4 MG/2ML IJ SOLN
INTRAMUSCULAR | Status: DC | PRN
  Administered 2022-12-14: 4 mg via INTRAVENOUS

## 2022-12-14 MED ORDER — ORAL CARE MOUTH RINSE: 15.0000 mL | Freq: Once | OROMUCOSAL | Status: AC

## 2022-12-14 MED ORDER — FENTANYL CITRATE (PF) 100 MCG/2ML IJ SOLN: 25.0000 ug | INTRAMUSCULAR | Status: DC | PRN

## 2022-12-14 MED ORDER — METHOCARBAMOL 1000 MG/10ML IJ SOLN: 500.0000 mg | Freq: Four times a day (QID) | INTRAVENOUS | Status: DC | PRN

## 2022-12-14 MED ORDER — ONDANSETRON HCL 4 MG/2ML IJ SOLN
INTRAMUSCULAR | Status: AC
  Filled 2022-12-14: qty 2

## 2022-12-14 MED ORDER — POLYETHYLENE GLYCOL 3350 17 G PO PACK: 17.0000 g | PACK | Freq: Every day | ORAL | Status: DC | PRN

## 2022-12-14 MED ORDER — ROCURONIUM BROMIDE 10 MG/ML (PF) SYRINGE
PREFILLED_SYRINGE | INTRAVENOUS | Status: DC | PRN
  Administered 2022-12-14: 50 mg via INTRAVENOUS

## 2022-12-14 MED ORDER — CHLORHEXIDINE GLUCONATE 0.12 % MT SOLN
15.0000 mL | Freq: Once | OROMUCOSAL | Status: AC
  Administered 2022-12-14: 15 mL via OROMUCOSAL
  Filled 2022-12-14: qty 15

## 2022-12-14 MED ORDER — ONDANSETRON HCL 4 MG PO TABS: 4.0000 mg | ORAL_TABLET | Freq: Four times a day (QID) | ORAL | Status: DC | PRN

## 2022-12-14 MED ORDER — FENTANYL CITRATE (PF) 250 MCG/5ML IJ SOLN
INTRAMUSCULAR | Status: DC | PRN
  Administered 2022-12-14 (×3): 50 ug via INTRAVENOUS

## 2022-12-14 MED ORDER — PHENYLEPHRINE 80 MCG/ML (10ML) SYRINGE FOR IV PUSH (FOR BLOOD PRESSURE SUPPORT)
PREFILLED_SYRINGE | INTRAVENOUS | Status: DC | PRN
  Administered 2022-12-14: 160 ug via INTRAVENOUS

## 2022-12-14 MED ORDER — PROPOFOL 10 MG/ML IV BOLUS
INTRAVENOUS | Status: AC
  Filled 2022-12-14: qty 20

## 2022-12-14 MED ORDER — ONDANSETRON HCL 4 MG/2ML IJ SOLN: 4.0000 mg | Freq: Once | INTRAMUSCULAR | Status: DC | PRN

## 2022-12-14 MED ORDER — DIPHENHYDRAMINE HCL 12.5 MG/5ML PO ELIX: 12.5000 mg | ORAL_SOLUTION | ORAL | Status: DC | PRN

## 2022-12-14 MED ORDER — OXYCODONE HCL 5 MG PO TABS
5.0000 mg | ORAL_TABLET | ORAL | Status: DC | PRN
  Administered 2022-12-14: 5 mg via ORAL
  Filled 2022-12-14: qty 1

## 2022-12-14 MED ORDER — ROCURONIUM BROMIDE 10 MG/ML (PF) SYRINGE
PREFILLED_SYRINGE | INTRAVENOUS | Status: AC
  Filled 2022-12-14: qty 10

## 2022-12-14 MED ORDER — PROPOFOL 10 MG/ML IV BOLUS
INTRAVENOUS | Status: DC | PRN
  Administered 2022-12-14: 70 mg via INTRAVENOUS

## 2022-12-14 MED ORDER — METHOCARBAMOL 500 MG PO TABS
500.0000 mg | ORAL_TABLET | Freq: Four times a day (QID) | ORAL | Status: DC | PRN
  Administered 2022-12-14 – 2022-12-16 (×3): 500 mg via ORAL
  Filled 2022-12-14 (×4): qty 1

## 2022-12-14 MED ORDER — CEFAZOLIN SODIUM-DEXTROSE 2-4 GM/100ML-% IV SOLN
2.0000 g | Freq: Once | INTRAVENOUS | Status: AC
  Administered 2022-12-14: 2 g via INTRAVENOUS

## 2022-12-14 MED ORDER — VANCOMYCIN HCL 1000 MG IV SOLR
INTRAVENOUS | Status: DC | PRN
  Administered 2022-12-14: 1000 mg via TOPICAL

## 2022-12-14 MED ORDER — SODIUM CHLORIDE 0.9 % IV SOLN: INTRAVENOUS | Status: DC

## 2022-12-14 MED ORDER — OXYCODONE HCL 5 MG/5ML PO SOLN: 5.0000 mg | Freq: Once | ORAL | Status: DC | PRN

## 2022-12-14 MED ORDER — DOCUSATE SODIUM 100 MG PO CAPS
100.0000 mg | ORAL_CAPSULE | Freq: Two times a day (BID) | ORAL | Status: DC
  Administered 2022-12-14 – 2022-12-18 (×8): 100 mg via ORAL
  Filled 2022-12-14 (×8): qty 1

## 2022-12-14 MED ORDER — 0.9 % SODIUM CHLORIDE (POUR BTL) OPTIME
TOPICAL | Status: DC | PRN
  Administered 2022-12-14: 1000 mL

## 2022-12-14 MED ORDER — OXYCODONE HCL 5 MG PO TABS
5.0000 mg | ORAL_TABLET | ORAL | Status: DC | PRN
  Administered 2022-12-14 – 2022-12-16 (×7): 5 mg via ORAL
  Filled 2022-12-14 (×8): qty 1

## 2022-12-14 MED ORDER — INSULIN ASPART 100 UNIT/ML IJ SOLN: 0.0000 [IU] | INTRAMUSCULAR | Status: DC | PRN

## 2022-12-14 MED ORDER — CEFAZOLIN SODIUM-DEXTROSE 2-4 GM/100ML-% IV SOLN
INTRAVENOUS | Status: AC
  Filled 2022-12-14: qty 100

## 2022-12-14 MED ORDER — FENTANYL CITRATE (PF) 250 MCG/5ML IJ SOLN
INTRAMUSCULAR | Status: AC
  Filled 2022-12-14: qty 5

## 2022-12-14 MED ORDER — TRANEXAMIC ACID-NACL 1000-0.7 MG/100ML-% IV SOLN
1000.0000 mg | Freq: Once | INTRAVENOUS | Status: AC
  Administered 2022-12-14: 1000 mg via INTRAVENOUS
  Filled 2022-12-14: qty 100

## 2022-12-14 MED ORDER — SUGAMMADEX SODIUM 200 MG/2ML IV SOLN
INTRAVENOUS | Status: DC | PRN
  Administered 2022-12-14: 200 mg via INTRAVENOUS

## 2022-12-14 MED ORDER — LIDOCAINE 2% (20 MG/ML) 5 ML SYRINGE
INTRAMUSCULAR | Status: AC
  Filled 2022-12-14: qty 5

## 2022-12-14 MED ORDER — ONDANSETRON HCL 4 MG/2ML IJ SOLN
4.0000 mg | Freq: Four times a day (QID) | INTRAMUSCULAR | Status: DC | PRN
  Administered 2022-12-17: 4 mg via INTRAVENOUS
  Filled 2022-12-14: qty 2

## 2022-12-14 MED ORDER — METOCLOPRAMIDE HCL 5 MG PO TABS: 5.0000 mg | ORAL_TABLET | Freq: Three times a day (TID) | ORAL | Status: DC | PRN

## 2022-12-14 MED ORDER — VANCOMYCIN HCL 1000 MG IV SOLR
INTRAVENOUS | Status: AC
  Filled 2022-12-14: qty 20

## 2022-12-14 MED ORDER — HYDROMORPHONE HCL 1 MG/ML IJ SOLN
0.5000 mg | INTRAMUSCULAR | Status: DC | PRN
  Administered 2022-12-14 – 2022-12-16 (×4): 0.5 mg via INTRAVENOUS
  Filled 2022-12-14 (×4): qty 0.5

## 2022-12-14 MED ORDER — OXYCODONE HCL 5 MG PO TABS: 5.0000 mg | ORAL_TABLET | Freq: Once | ORAL | Status: DC | PRN

## 2022-12-14 MED ORDER — LIDOCAINE 2% (20 MG/ML) 5 ML SYRINGE
INTRAMUSCULAR | Status: DC | PRN
  Administered 2022-12-14: 40 mg via INTRAVENOUS

## 2022-12-14 MED ORDER — ENOXAPARIN SODIUM 40 MG/0.4ML IJ SOSY
40.0000 mg | PREFILLED_SYRINGE | INTRAMUSCULAR | Status: DC
  Administered 2022-12-15: 40 mg via SUBCUTANEOUS
  Filled 2022-12-14: qty 0.4

## 2022-12-14 MED ORDER — LACTATED RINGERS IV SOLN: INTRAVENOUS | Status: DC

## 2022-12-14 MED ORDER — METOCLOPRAMIDE HCL 5 MG/ML IJ SOLN: 5.0000 mg | Freq: Three times a day (TID) | INTRAMUSCULAR | Status: DC | PRN

## 2022-12-14 SURGICAL SUPPLY — 77 items
ADH SKN CLS APL DERMABOND .7 (GAUZE/BANDAGES/DRESSINGS) ×1
APL PRP STRL LF DISP 70% ISPRP (MISCELLANEOUS) ×2
BAG COUNTER SPONGE SURGICOUNT (BAG) ×1 IMPLANT
BAG SPNG CNTER NS LX DISP (BAG) ×1
BIT DRILL 4.3 (BIT)
BIT DRILL 4.3X300MM (BIT) IMPLANT
BIT DRILL LONG 3.3 (BIT) IMPLANT
BIT DRILL QC 3.3X195 (BIT) IMPLANT
BLADE CLIPPER SURG (BLADE) IMPLANT
BNDG CMPR 5X6 CHSV STRCH STRL (GAUZE/BANDAGES/DRESSINGS)
BNDG CMPR MED 10X6 ELC LF (GAUZE/BANDAGES/DRESSINGS)
BNDG COHESIVE 6X5 TAN ST LF (GAUZE/BANDAGES/DRESSINGS) ×1 IMPLANT
BNDG ELASTIC 4X5.8 VLCR STR LF (GAUZE/BANDAGES/DRESSINGS) IMPLANT
BNDG ELASTIC 6X10 VLCR STRL LF (GAUZE/BANDAGES/DRESSINGS) ×1 IMPLANT
BNDG ELASTIC 6X5.8 VLCR STR LF (GAUZE/BANDAGES/DRESSINGS) IMPLANT
BRUSH SCRUB EZ PLAIN DRY (MISCELLANEOUS) ×2 IMPLANT
CANISTER SUCT 3000ML PPV (MISCELLANEOUS) ×1 IMPLANT
CAP LOCK NCB (Cap) IMPLANT
CHLORAPREP W/TINT 26 (MISCELLANEOUS) ×1 IMPLANT
COVER SURGICAL LIGHT HANDLE (MISCELLANEOUS) ×1 IMPLANT
DERMABOND ADVANCED .7 DNX12 (GAUZE/BANDAGES/DRESSINGS) IMPLANT
DRAPE C-ARM 42X72 X-RAY (DRAPES) ×1 IMPLANT
DRAPE C-ARMOR (DRAPES) ×1 IMPLANT
DRAPE HALF SHEET 40X57 (DRAPES) ×2 IMPLANT
DRAPE ORTHO SPLIT 77X108 STRL (DRAPES) ×2
DRAPE SURG 17X23 STRL (DRAPES) ×1 IMPLANT
DRAPE SURG ORHT 6 SPLT 77X108 (DRAPES) ×2 IMPLANT
DRAPE U-SHAPE 47X51 STRL (DRAPES) ×1 IMPLANT
DRSG ADAPTIC 3X8 NADH LF (GAUZE/BANDAGES/DRESSINGS) IMPLANT
DRSG MEPILEX BORDER 4X12 (GAUZE/BANDAGES/DRESSINGS) IMPLANT
DRSG MEPILEX BORDER 4X4 (GAUZE/BANDAGES/DRESSINGS) IMPLANT
DRSG MEPILEX BORDER 4X8 (GAUZE/BANDAGES/DRESSINGS) IMPLANT
DRSG MEPILEX POST OP 4X8 (GAUZE/BANDAGES/DRESSINGS) IMPLANT
ELECT REM PT RETURN 9FT ADLT (ELECTROSURGICAL) ×1
ELECTRODE REM PT RTRN 9FT ADLT (ELECTROSURGICAL) ×1 IMPLANT
GAUZE PAD ABD 8X10 STRL (GAUZE/BANDAGES/DRESSINGS) ×3 IMPLANT
GAUZE SPONGE 4X4 12PLY STRL (GAUZE/BANDAGES/DRESSINGS) ×1 IMPLANT
GLOVE BIO SURGEON STRL SZ 6.5 (GLOVE) ×3 IMPLANT
GLOVE BIO SURGEON STRL SZ7.5 (GLOVE) ×4 IMPLANT
GLOVE BIOGEL PI IND STRL 6.5 (GLOVE) ×1 IMPLANT
GLOVE BIOGEL PI IND STRL 7.5 (GLOVE) ×1 IMPLANT
GOWN STRL REUS W/ TWL LRG LVL3 (GOWN DISPOSABLE) ×3 IMPLANT
GOWN STRL REUS W/TWL LRG LVL3 (GOWN DISPOSABLE) ×3
K-WIRE 2.0 (WIRE) ×2
K-WIRE FXSTD 280X2XNS SS (WIRE) ×2
KIT BASIN OR (CUSTOM PROCEDURE TRAY) ×1 IMPLANT
KIT TURNOVER KIT B (KITS) ×1 IMPLANT
KWIRE FXSTD 280X2XNS SS (WIRE) IMPLANT
NS IRRIG 1000ML POUR BTL (IV SOLUTION) ×1 IMPLANT
PACK TOTAL JOINT (CUSTOM PROCEDURE TRAY) ×1 IMPLANT
PAD ARMBOARD 7.5X6 YLW CONV (MISCELLANEOUS) ×2 IMPLANT
PAD CAST 4YDX4 CTTN HI CHSV (CAST SUPPLIES) ×1 IMPLANT
PADDING CAST COTTON 4X4 STRL (CAST SUPPLIES) ×1
PADDING CAST COTTON 6X4 STRL (CAST SUPPLIES) ×1 IMPLANT
PLATE FEM DIST NCB PP 278MM (Plate) IMPLANT
SCREW 5.0 70MM (Screw) IMPLANT
SCREW 5.0 80MM (Screw) IMPLANT
SCREW CORTICAL NCB 5.0X65 (Screw) IMPLANT
SCREW NCB 3.5X75X5X6.2XST (Screw) IMPLANT
SCREW NCB 4.0MX34M (Screw) IMPLANT
SCREW NCB 4.0MX38M (Screw) IMPLANT
SCREW NCB 5.0X75MM (Screw) ×2 IMPLANT
SPONGE T-LAP 18X18 ~~LOC~~+RFID (SPONGE) IMPLANT
STAPLER VISISTAT 35W (STAPLE) ×1 IMPLANT
SUCTION FRAZIER HANDLE 10FR (MISCELLANEOUS)
SUCTION TUBE FRAZIER 10FR DISP (MISCELLANEOUS) ×1 IMPLANT
SUT ETHILON 3 0 PS 1 (SUTURE) ×2 IMPLANT
SUT MNCRL AB 3-0 PS2 27 (SUTURE) IMPLANT
SUT VIC AB 0 CT1 27 (SUTURE) ×1
SUT VIC AB 0 CT1 27XBRD ANBCTR (SUTURE) IMPLANT
SUT VIC AB 1 CT1 27 (SUTURE)
SUT VIC AB 1 CT1 27XBRD ANBCTR (SUTURE) IMPLANT
SUT VIC AB 2-0 CT1 27 (SUTURE) ×1
SUT VIC AB 2-0 CT1 TAPERPNT 27 (SUTURE) ×2 IMPLANT
TOWEL GREEN STERILE (TOWEL DISPOSABLE) ×2 IMPLANT
TRAY FOLEY MTR SLVR 16FR STAT (SET/KITS/TRAYS/PACK) IMPLANT
WATER STERILE IRR 1000ML POUR (IV SOLUTION) ×2 IMPLANT

## 2022-12-14 NOTE — H&P (View-Only) (Signed)
Ortho Trauma Note  The patient and her husband desire Dr. Ihor Gully to perform the surgical fixation of her distal femur.  Fortunately he did not have time in the fracture was a little bit out of his comfort in terms of scope of practice for surgical fixation.  As result I was asked again to potentially proceed with surgical fixation.  I discussed risks and benefits with the patient and the husband.  Risks included but not limited to bleeding, infection, malunion, nonunion, hardware failure, hardware irritation, nerve or blood vessel injury, DVT, even the possible anesthetic complications.  They agreed to proceed with surgery and consent was obtained.  Shona Needles, MD [facility] [phone] (office) [email or web address]

## 2022-12-14 NOTE — Op Note (Signed)
Orthopaedic Surgery Operative Note (CSN: [id number] ) Date of Surgery: 12/14/2022  Admit Date: 12/11/2022   Diagnoses: Pre-Op Diagnoses: Right periprosthetic distal femur fracture  Post-Op Diagnosis: Same  Procedures: CPT 27511-Open reduction internal fixation of right supracondylar distal femur fracture CPT 20680-Removal of hardware right femur  Surgeons : Primary: Shona Needles, MD  Assistant: Patrecia Pace, PA-C  Location: OR 3   Anesthesia:General  Antibiotics: Ancef 2g preop with 1 gm vancomycin powder placed topically   Tourniquet time: None    Estimated Blood Loss: [id number] mL  Complications:None  Specimens:None   Implants: Implant Name Type Inv. Item Serial No. Manufacturer Lot No. LRB No. Used Action  PLATE FEM DIST NCB PP 278MM - [id number] Plate PLATE FEM DIST NCB PP 278MM  [employer](ORTH,TRAU,BIO,SG)  Right 1 Implanted  SCREW NCB 4.0MX34M - [id number] Screw SCREW NCB 4.0MX34M  [employer](ORTH,TRAU,BIO,SG)  Right 3 Implanted  SCREW NCB 4.0MX38M - [id number] Screw SCREW NCB 4.0MX38M  [employer](ORTH,TRAU,BIO,SG)  Right 1 Implanted  SCREW 5.0 70MM - [id number] Screw SCREW 5.0 70MM  [employer](ORTH,TRAU,BIO,SG)  Right 1 Implanted  SCREW NCB 5.0X75MM - [id number] Screw SCREW NCB 5.0X75MM  [employer](ORTH,TRAU,BIO,SG)  Right 2 Implanted  SCREW 5.0 80MM - [id number] Screw SCREW 5.0 80MM  [employer](ORTH,TRAU,BIO,SG)  Right 2 Implanted  SCREW CORTICAL NCB 5.0X65 - [id number] Screw SCREW CORTICAL NCB 5.0X65  [employer](ORTH,TRAU,BIO,SG)  Right 1 Implanted  CAP LOCK NCB - [id number] Cap CAP LOCK NCB  [employer](ORTH,TRAU,BIO,SG)  Right 9 Implanted     Indications for Surgery: 72 year old female who sustained a periprosthetic right distal femur fracture.  Due to the unstable nature of her injury I recommend proceeding with open reduction internal fixation.  Risk and benefits were discussed with the patient and her husband.  Risks included but not limited to  bleeding, infection, malunion, nonunion, hardware failure, hardware irritation, nerve or blood vessel injury, DVT, even the possibility anesthetic complications.  They agreed to proceed with surgery and consent was obtained.  Operative Findings: 1.  Removal of distal interlocking screw from previous cephalomedullary nail in the midshaft of the femur 2.  Open reduction internal fixation of right supracondylar distal femur fracture using [employer] NCB distal femoral locking plate.  Procedure: The patient was identified in the preoperative holding area. Consent was confirmed with the patient and their family and all questions were answered. The operative extremity was marked after confirmation with the patient. she was then brought back to the operating room by our anesthesia colleagues.  She was placed under general anesthetic and carefully transferred over to radiolucent flattop table.  A bump was placed under her operative hip.  The right lower extremity was then prepped and draped in usual sterile fashion.  A timeout was performed to verify the patient, the procedure, and the extremity.  Preoperative antibiotics were dosed.  The hip and knee were flexed over a triangle and fluoroscopic imaging was obtained to show the unstable nature of her injury.  I for started out by attempting to remove the distal interlocking screw from the cephalomedullary nail that was previously in place.  I made an incision through the previous scar and carried it down to the screw head.  I used a 3.5 mm hex screwdriver but unfortunately the screw had stripped.  I had to use a strip screw removal set and was able to successfully remove this.  Once I had this out I then made a lateral approach to the distal femur carried it down through  skin and subcutaneous tissue.  I incised through the IT band and expose the lateral condyle of the distal femur.  Reduction was performed with use of traction and manipulation of the triangle.   I then aligned the fracture appropriately and attached a 12 hole [employer] NCB distal femoral locking plate to a targeting arm and slid this submuscularly along the lateral cortex of the femur.  I held the distal portion provisionally with a 2.0 mm K wire.  I held the proximal portion in position with a 3.3 mm drill bit just distal to the previous hip nail.  Once I confirmed positioning alignment I then placed a 5.0 mm nonlocking screw distally to bring the plate flush to bone.  I placed a total of 2 screws distally and then I returned to the femoral shaft and proceeded to place a total of 4 screws.  The most proximal screw I was able to get bicortical posterior to the previous nail.  I placed locking caps on the middle 2 screws.  I then returned to the distal segment and proceeded to place a total of 6 screws.  Locking caps were placed on all of the distal screws.  Final fluoroscopic imaging was obtained.  The incisions were copiously irrigated.  A gram of vancomycin powder was placed into the incision.  A layered closure of 0 Vicryl, 2-0 Vicryl and 3-0 Monocryl with Dermabond was used to close the skin.  Post Op Plan/Instructions: The patient will be weightbearing for transfers.  She will receive postoperative Ancef.  She will be placed on Lovenox for DVT prophylaxis and transition to an oral DOAC.  We will have her mobilize with physical and Occupational Therapy.  I was present and performed the entire surgery.  Patrecia Pace, PA-C did assist me throughout the case. An assistant was necessary given the difficulty in approach, maintenance of reduction and ability to instrument the fracture.   Katha Hamming, MD [facility]

## 2022-12-14 NOTE — Progress Notes (Signed)
Ortho Trauma Note  The patient and her husband desire Dr. Ihor Gully to perform the surgical fixation of her distal femur.  Fortunately he did not have time in the fracture was a little bit out of his comfort in terms of scope of practice for surgical fixation.  As result I was asked again to potentially proceed with surgical fixation.  I discussed risks and benefits with the patient and the husband.  Risks included but not limited to bleeding, infection, malunion, nonunion, hardware failure, hardware irritation, nerve or blood vessel injury, DVT, even the possible anesthetic complications.  They agreed to proceed with surgery and consent was obtained.  Shona Needles, MD [facility] [phone] (office) [email or web address]

## 2022-12-14 NOTE — Anesthesia Preprocedure Evaluation (Addendum)
Anesthesia Evaluation  Patient identified by MRN, date of birth, ID band Patient awake    Reviewed: Allergy & Precautions, NPO status , Patient's Chart, lab work & pertinent test results, reviewed documented beta blocker date and time   Airway Mallampati: III  TM Distance: >3 FB Neck ROM: Full  Mouth opening: Limited Mouth Opening  Dental no notable dental hx. (+) Teeth Intact, Dental Advisory Given   Pulmonary shortness of breath   Pulmonary exam normal breath sounds clear to auscultation       Cardiovascular hypertension, Pt. on home beta blockers and Pt. on medications + CAD and +CHF  Normal cardiovascular exam Rhythm:Regular Rate:Normal  TTE 2022 1. Left ventricular ejection fraction, by estimation, is 60 to 65%. The  left ventricle has normal function. The left ventricle has no regional  wall motion abnormalities. Left ventricular diastolic parameters were  normal.   2. Right ventricular systolic function is normal. The right ventricular  size is normal. There is mildly elevated pulmonary artery systolic  pressure.   3. Left atrial size was mildly dilated.   4. The mitral valve is normal in structure. Mild mitral valve  regurgitation.   5. The aortic valve is tricuspid. Aortic valve regurgitation is not  visualized. Mild aortic valve sclerosis is present, with no evidence of  aortic valve stenosis.   6. The inferior vena cava is normal in size with greater than 50%  respiratory variability, suggesting right atrial pressure of 3 mmHg.      Neuro/Psych  Headaches PSYCHIATRIC DISORDERS Anxiety Depression    CVA    GI/Hepatic Neg liver ROS, hiatal hernia,GERD  ,,Crohn's disease   Endo/Other  diabetes, Poorly Controlled, Type 2, Insulin Dependent    Renal/GU Renal InsufficiencyRenal diseaseLab Results      Component                Value               Date                      CREATININE               2.66 (H)             12/13/2022                BUN                      64 (H)              12/13/2022                NA                       134 (L)             12/13/2022                K                        4.8                 12/13/2022                CL                       110  12/13/2022                CO2                      13 (L)              12/13/2022             negative genitourinary   Musculoskeletal  (+) Arthritis ,    Abdominal   Peds  Hematology  (+) Blood dyscrasia, anemia Lab Results      Component                Value               Date                      WBC                      11.5 (H)            12/13/2022                HGB                      8.3 (L)             12/13/2022                HCT                      24.2 (L)            12/13/2022                MCV                      92.9                12/13/2022                PLT                      181                 12/13/2022              Anesthesia Other Findings   Reproductive/Obstetrics                             Anesthesia Physical Anesthesia Plan  ASA: 3  Anesthesia Plan: General   Post-op Pain Management: Tylenol PO (pre-op)*   Induction: Intravenous  PONV Risk Score and Plan: 3 and Ondansetron and Treatment may vary due to age or medical condition  Airway Management Planned: Oral ETT  Additional Equipment:   Intra-op Plan:   Post-operative Plan: Extubation in OR  Informed Consent: I have reviewed the patients History and Physical, chart, labs and discussed the procedure including the risks, benefits and alternatives for the proposed anesthesia with the patient or authorized representative who has indicated his/her understanding and acceptance.     Dental advisory given  Plan Discussed with: CRNA  Anesthesia Plan Comments:        Anesthesia Quick Evaluation

## 2022-12-14 NOTE — Transfer of Care (Signed)
Immediate Anesthesia Transfer of Care Note  Patient: Heather [name]  Procedure(s) Performed: OPEN REDUCTION INTERNAL FIXATION (ORIF) DISTAL FEMUR FRACTURE (Right: Leg Upper)  Patient Location: PACU  Anesthesia Type:General  Level of Consciousness: awake, alert , and oriented  Airway & Oxygen Therapy: Patient Spontanous Breathing  Post-op Assessment: Report given to RN, Post -op Vital signs reviewed and stable, and Patient moving all extremities X 4  Post vital signs: Reviewed and stable  Last Vitals:  Vitals Value Taken Time  BP 149/80 12/14/22 1604  Temp    Pulse 98 12/14/22 1608  Resp 14 12/14/22 1608  SpO2 96 % 12/14/22 1608  Vitals shown include unvalidated device data.  Last Pain:  Vitals:   12/14/22 1321  TempSrc:   PainSc: 6          Complications: No notable events documented.

## 2022-12-14 NOTE — Anesthesia Procedure Notes (Signed)
Procedure Name: Intubation Date/Time: 12/14/2022 2:16 PM  Performed by: Kyung Rudd, CRNAPre-anesthesia Checklist: Patient identified, Emergency Drugs available, Suction available and Patient being monitored Patient Re-evaluated:Patient Re-evaluated prior to induction Oxygen Delivery Method: Circle system utilized Preoxygenation: Pre-oxygenation with 100% oxygen Induction Type: IV induction Ventilation: Mask ventilation without difficulty Laryngoscope Size: Mac and 3 Grade View: Grade II Tube type: Oral Tube size: 7.0 mm Number of attempts: 1 Airway Equipment and Method: Stylet Placement Confirmation: ETT inserted through vocal cords under direct vision, positive ETCO2 and breath sounds checked- equal and bilateral Secured at: 21 cm Tube secured with: Tape Dental Injury: Teeth and Oropharynx as per pre-operative assessment

## 2022-12-14 NOTE — Progress Notes (Signed)
PROGRESS NOTE  Heather [name] [id number] DOB: 12/17/1950 DOA: 12/11/2022 PCP: [name], MD   LOS: 3 days   Brief Narrative / Interim history: 72 year old female with DM2, HTN, CKD 4, chronic diastolic CHF comes into the hospital with a fall.  She has a history of frequent falls, she is supposed to use a walker ambulation, and on the day of admission she rolled out of bed and fell onto the floor.  She was unable to get up.  In the ED she was found to have right distal femur fracture.  She was admitted to the hospital and orthopedic surgery consulted.  Of note, she had a recent hospitalization in mid January for HHS with a right humeral neck fracture  Subjective / 24h Interval events: Complains of hip pain, otherwise has no complaints.  She does not know when she is going to have the surgery or who is going to do it.  Assesement and Plan: Principal Problem:   Fracture Active Problems:   Recurrent falls   Uncontrolled type 2 diabetes mellitus with hyperglycemia, with long-term current use of insulin (HCC)   Hypertension   Chronic diastolic CHF (congestive heart failure) (HCC)   Chronic kidney disease (CKD), stage IV (severe) (HCC)   Anemia due to chronic kidney disease  Principal problem Right distal femur fracture -orthopedic surgery consult, appreciate input.  Looks like patient and her family requesting Dr. Alvan Dame, discussed with Hilbert Odor, PA and the orthopedics groups will discuss and determine timing of surgery and who is going to do the surgery  Active problems Essential hypertension-continue home metoprolol, hydralazine, blood pressure overall acceptable  Chronic diastolic CHF -she appears euvolemic today, last echo October 2022, EF 60-65%  CKD stage IV -variable baseline, 2.5-3.5.  Currently within her acceptable baseline.  Will recheck in the morning  UTI - UA suggestive on infection and she is symptomatic with pain / burning with urination.  Continue  ceftriaxone today day #3, culture showing lactobacillus.  Continue to biotics for 2 more days  Hypomagnesemia-recheck magnesium in the morning  Hyperkalemia-mild, due to chronic kidney disease.  Recheck potassium in the morning  Anemia of chronic renal disease -there is no bleeding, hemoglobin dipped to 6.6 2/15.  Transfused 1 unit of packed red blood cells.  Improved appropriately  DM2, uncontrolled, with hyperglycemia -A1c over 15.  CBGs controlled while here  CBG (last 3)  Recent Labs    12/14/22 0055 12/14/22 0452 12/14/22 0812  GLUCAP 106* 101* 105*    Scheduled Meds:  allopurinol  100 mg Oral Daily   atorvastatin  80 mg Oral Daily   Chlorhexidine Gluconate Cloth  6 each Topical Daily   gabapentin  300 mg Oral QHS   hydrALAZINE  50 mg Oral Q8H   insulin aspart  0-15 Units Subcutaneous Q4H   levETIRAcetam  500 mg Oral BID   lipase/protease/amylase  36,000 Units Oral TID WC   metoprolol succinate  37.5 mg Oral Daily   venlafaxine XR  150 mg Oral Daily   Continuous Infusions:  cefTRIAXone (ROCEPHIN)  IV 1 g (12/13/22 1625)   PRN Meds:.acetaminophen **OR** acetaminophen, HYDROmorphone (DILAUDID) injection, oxyCODONE, polyethylene glycol  Current Outpatient Medications  Medication Instructions   acetaminophen (TYLENOL) 650 mg, Oral, Every 4 hours PRN   allopurinol (ZYLOPRIM) 100 mg, Oral, Daily   atorvastatin (LIPITOR) 80 mg, Oral, Daily, Restart on 06/29/22   diphenhydrAMINE (BENADRYL) 2 % cream Topical, 3 times daily PRN   furosemide (LASIX) 40 MG tablet TAKE 1  TABLET BY MOUTH ONCE DAILY ALONG WITH 20MG TABLET FOR INCREASED FLUID RETENTION   gabapentin (NEURONTIN) 300 MG capsule 1 capsule, Oral, Daily at bedtime   hydrALAZINE (APRESOLINE) 50 mg, Oral, Every 8 hours   insulin aspart (NOVOLOG) 5 Units, Subcutaneous, 3 times daily with meals, Give only if eats 50% or more of meal.   insulin glargine-yfgn (SEMGLEE) 100 UNIT/ML Pen INJECT 15 UNITS UNDER THE SKIN ONCE DAILY    levETIRAcetam (KEPPRA) 500 mg, Oral, 2 times daily   lipase/protease/amylase (CREON) 36,000 Units, Oral, 3 times daily with meals   methocarbamol (ROBAXIN) 500 mg, Oral, Every 8 hours PRN   metoprolol succinate (TOPROL-XL) 37.5 mg, Oral, Daily   naloxone (NARCAN) nasal spray 4 mg/0.1 mL 1 spray, Nasal,  Once   naproxen sodium (ALEVE) 440 mg, Oral, Daily PRN   polyethylene glycol (MIRALAX / GLYCOLAX) 17 g, Oral, Daily   traMADol (ULTRAM) 50 mg, Oral, 2 times daily   venlafaxine XR (EFFEXOR-XR) 150 mg, Oral, Daily    Diet Orders (From admission, onward)     Start     Ordered   12/14/22 0452  Diet NPO time specified  Diet effective now        12/14/22 0451            DVT prophylaxis: SCDs Start: 12/12/22 0033   Lab Results  Component Value Date   PLT 181 12/13/2022      Code Status: Full Code  Family Communication: no family at bedside   Status is: Inpatient  Remains inpatient appropriate because: severity of illness  Level of care: Med-Surg  Consultants:  Orthopedic surgery   Objective: Vitals:   12/13/22 1449 12/13/22 2000 12/14/22 0400 12/14/22 0815  BP: (!) 155/66 (!) 148/54 (!) 146/61 (!) 154/60  Pulse: (!) 105 99 99 (!) 104  Resp:    12  Temp: 98.1 F (36.7 C) 98.5 F (36.9 C) 98.7 F (37.1 C) 98.2 F (36.8 C)  TempSrc:  Oral Oral Oral  SpO2: 99% 99% 99% 100%  Weight:      Height:        Intake/Output Summary (Last 24 hours) at 12/14/2022 1059 Last data filed at 12/14/2022 0700 Gross per 24 hour  Intake --  Output 1050 ml  Net -1050 ml    Wt Readings from Last 3 Encounters:  12/12/22 52 kg  11/12/22 51.3 kg  11/01/22 51.6 kg    Examination:  Constitutional: NAD Eyes: lids and conjunctivae normal, no scleral icterus ENMT: mmm Neck: normal, supple Respiratory: clear to auscultation bilaterally, no wheezing, no crackles. Normal respiratory effort.  Cardiovascular: Regular rate and rhythm, no murmurs / rubs / gallops. No LE  edema. Abdomen: soft, no distention, no tenderness. Bowel sounds positive.  Skin: no rashes Neurologic: no focal deficits, equal strength  Data Reviewed: I have independently reviewed following labs and imaging studies   CBC Recent Labs  Lab 12/11/22 1737 12/12/22 0253 12/13/22 0445 12/13/22 2010  WBC 9.5 10.2 11.5*  --   HGB 7.8* 7.8* 6.6* 8.3*  HCT 24.9* 24.3* 19.5* 24.2*  PLT 255 269 181  --   MCV 97.6 94.6 92.9  --   MCH 30.6 30.4 31.4  --   MCHC 31.3 32.1 33.8  --   RDW 17.3* 17.1* 17.3*  --   LYMPHSABS 0.6*  --   --   --   MONOABS 0.3  --   --   --   EOSABS 0.1  --   --   --  BASOSABS 0.1  --   --   --      Recent Labs  Lab 12/11/22 1737 12/12/22 0253 12/13/22 0445  NA 136 136 134*  K 5.5* 5.3* 4.8  CL 109 112* 110  CO2 16* 15* 13*  GLUCOSE 211* 79 125*  BUN 57* 60* 64*  CREATININE 2.12* 2.30* 2.66*  CALCIUM 9.0 8.5* 8.3*  AST  --   --  17  ALT  --   --  17  ALKPHOS  --   --  105  BILITOT  --   --  0.5  ALBUMIN  --   --  2.1*  MG  --   --  1.2*  INR 1.1  --   --      ------------------------------------------------------------------------------------------------------------------ No results for input(s): "CHOL", "HDL", "LDLCALC", "TRIG", "CHOLHDL", "LDLDIRECT" in the last 72 hours.  Lab Results  Component Value Date   HGBA1C >15.5 (H) 10/17/2022   ------------------------------------------------------------------------------------------------------------------ No results for input(s): "TSH", "T4TOTAL", "T3FREE", "THYROIDAB" in the last 72 hours.  Invalid input(s): "FREET3"  Cardiac Enzymes No results for input(s): "CKMB", "TROPONINI", "MYOGLOBIN" in the last 168 hours.  Invalid input(s): "CK" ------------------------------------------------------------------------------------------------------------------    Component Value Date/Time   BNP 430.0 (H) 08/19/2021 0728    CBG: Recent Labs  Lab 12/13/22 1611 12/13/22 2038 12/14/22 0055  12/14/22 0452 12/14/22 0812  GLUCAP 163* 144* 106* 101* 105*     Recent Results (from the past 240 hour(s))  Urine Culture (for pregnant, neutropenic or urologic patients or patients with an indwelling urinary catheter)     Status: Abnormal   Collection Time: 12/12/22  5:29 PM   Specimen: Urine, Catheterized  Result Value Ref Range Status   Specimen Description URINE, CATHETERIZED  Final   Special Requests NONE  Final   Culture (A)  Final    >=100,000 COLONIES/mL LACTOBACILLUS SPECIES Standardized susceptibility testing for this organism is not available. Performed at [facility], [place] [address], [place],  [address]    Report Status 12/13/2022 FINAL  Final     Radiology Studies: No results found.   Marzetta Board, MD, PhD [facility]  Between 7 am - 7 pm I am available, please contact me via Amion (for emergencies) or Securechat (non urgent messages)  Between 7 pm - 7 am I am not available, please contact night coverage MD/APP via Amion

## 2022-12-14 NOTE — Care Management Important Message (Signed)
Important Message  Patient Details  Name: Heather [name] MRN: [id number] Date of Birth: 1951/07/12   Medicare Important Message Given:  Yes     [name] 12/14/2022, 11:44 AM

## 2022-12-14 NOTE — Interval H&P Note (Signed)
History and Physical Interval Note:  12/14/2022 1:10 PM  Heather [name]  has presented today for surgery, with the diagnosis of Right distal femur fracture.  The various methods of treatment have been discussed with the patient and family. After consideration of risks, benefits and other options for treatment, the patient has consented to  Procedure(s): OPEN REDUCTION INTERNAL FIXATION (ORIF) DISTAL FEMUR FRACTURE (Right) as a surgical intervention.  The patient's history has been reviewed, patient examined, no change in status, stable for surgery.  I have reviewed the patient's chart and labs.  Questions were answered to the patient's satisfaction.     Lennette Bihari P Sharea Guinther

## 2022-12-15 ENCOUNTER — Inpatient Hospital Stay (HOSPITAL_COMMUNITY): Payer: Medicare PPO

## 2022-12-15 DIAGNOSIS — T148XXA Other injury of unspecified body region, initial encounter: Secondary | ICD-10-CM | POA: Diagnosis not present

## 2022-12-15 LAB — GLUCOSE, CAPILLARY
Glucose-Capillary: 117 mg/dL — ABNORMAL HIGH (ref 70–99)
Glucose-Capillary: 142 mg/dL — ABNORMAL HIGH (ref 70–99)
Glucose-Capillary: 152 mg/dL — ABNORMAL HIGH (ref 70–99)
Glucose-Capillary: 184 mg/dL — ABNORMAL HIGH (ref 70–99)
Glucose-Capillary: 187 mg/dL — ABNORMAL HIGH (ref 70–99)
Glucose-Capillary: 203 mg/dL — ABNORMAL HIGH (ref 70–99)
Glucose-Capillary: 241 mg/dL — ABNORMAL HIGH (ref 70–99)
Glucose-Capillary: 77 mg/dL (ref 70–99)

## 2022-12-15 LAB — TROPONIN I (HIGH SENSITIVITY)
Troponin I (High Sensitivity): 23 ng/L — ABNORMAL HIGH (ref <18)
Troponin I (High Sensitivity): 129 ng/L (ref <18)

## 2022-12-15 LAB — COMPREHENSIVE METABOLIC PANEL
ALT: 10 U/L (ref 0–44)
AST: 21 U/L (ref 15–41)
Albumin: 2.2 g/dL — ABNORMAL LOW (ref 3.5–5.0)
Alkaline Phosphatase: 111 U/L (ref 38–126)
Anion gap: 11 (ref 5–15)
BUN: 54 mg/dL — ABNORMAL HIGH (ref 8–23)
CO2: 14 mmol/L — ABNORMAL LOW (ref 22–32)
Calcium: 8.5 mg/dL — ABNORMAL LOW (ref 8.9–10.3)
Chloride: 110 mmol/L (ref 98–111)
Creatinine, Ser: 2.42 mg/dL — ABNORMAL HIGH (ref 0.44–1.00)
GFR, Estimated: 21 mL/min — ABNORMAL LOW (ref >60)
Glucose, Bld: 153 mg/dL — ABNORMAL HIGH (ref 70–99)
Potassium: 5.2 mmol/L — ABNORMAL HIGH (ref 3.5–5.1)
Sodium: 135 mmol/L (ref 135–145)
Total Bilirubin: 0.2 mg/dL — ABNORMAL LOW (ref 0.3–1.2)
Total Protein: 5.4 g/dL — ABNORMAL LOW (ref 6.5–8.1)

## 2022-12-15 LAB — CBC
HCT: 25 % — ABNORMAL LOW (ref 36.0–46.0)
Hemoglobin: 8.3 g/dL — ABNORMAL LOW (ref 12.0–15.0)
MCH: 31 pg (ref 26.0–34.0)
MCHC: 33.2 g/dL (ref 30.0–36.0)
MCV: 93.3 fL (ref 80.0–100.0)
Platelets: 219 10*3/uL (ref 150–400)
RBC: 2.68 MIL/uL — ABNORMAL LOW (ref 3.87–5.11)
RDW: 17.2 % — ABNORMAL HIGH (ref 11.5–15.5)
WBC: 11.2 10*3/uL — ABNORMAL HIGH (ref 4.0–10.5)
nRBC: 0 % (ref 0.0–0.2)

## 2022-12-15 LAB — MAGNESIUM: Magnesium: 1.6 mg/dL — ABNORMAL LOW (ref 1.7–2.4)

## 2022-12-15 MED ORDER — ALUM & MAG HYDROXIDE-SIMETH 200-200-20 MG/5ML PO SUSP
15.0000 mL | ORAL | Status: DC | PRN
  Administered 2022-12-15 – 2022-12-17 (×3): 15 mL via ORAL
  Filled 2022-12-15 (×3): qty 30

## 2022-12-15 MED ORDER — SODIUM ZIRCONIUM CYCLOSILICATE 10 G PO PACK
10.0000 g | PACK | Freq: Once | ORAL | Status: AC
  Administered 2022-12-15: 10 g via ORAL
  Filled 2022-12-15: qty 1

## 2022-12-15 MED ORDER — MAGNESIUM SULFATE IN D5W 1-5 GM/100ML-% IV SOLN
1.0000 g | Freq: Once | INTRAVENOUS | Status: AC
  Administered 2022-12-15: 1 g via INTRAVENOUS
  Filled 2022-12-15: qty 100

## 2022-12-15 MED ORDER — ENOXAPARIN SODIUM 30 MG/0.3ML IJ SOSY
30.0000 mg | PREFILLED_SYRINGE | INTRAMUSCULAR | Status: DC
  Administered 2022-12-16 – 2022-12-18 (×3): 30 mg via SUBCUTANEOUS
  Filled 2022-12-15 (×3): qty 0.3

## 2022-12-15 NOTE — Evaluation (Signed)
Physical Therapy Evaluation Patient Details Name: Heather [name] MRN: [id number] DOB: [birth year] Today's Date: 12/15/2022  History of Present Illness  Pt is 72 yo female presenting post fall after rolling out of the bed. Upon arrival to ED found to have R distal femur fracture. Pt was admitted in mid January for R humeral neck fracture. Pt is currently post op ORIF of R supracondylar femur fracture. Pt is WB for transfers only. PMH: recurrent falls, DM II, HTN, CHF, CKD IV, Anemia.  Clinical Impression  Pt is presenting below her baseline level of function. Pt reports that previous to hospitalization she was a Min A for most activities and her spouse was able to assist with functional transfers and ADL's. Currently pt is 2 person Max A for bed mobility and sit to stand. Due to pt current functional status, home set up and available assistance at home recommending skilled physical therapy services in SNF setting on discharge from acute care hospital in order to decrease risk for falls, injury, immobility and re-hospitalization. Pt demonstrates no signs/symptoms of cardiac/respiratory distress throughout session.        Recommendations for follow up therapy are one component of a multi-disciplinary discharge planning process, led by the attending physician.  Recommendations may be updated based on patient status, additional functional criteria and insurance authorization.  Follow Up Recommendations Skilled nursing-short term rehab (<3 hours/day) Can patient physically be transported by private vehicle: No    Assistance Recommended at Discharge Frequent or constant Supervision/Assistance  Patient can return home with the following  Two people to help with walking and/or transfers;Help with stairs or ramp for entrance;Assist for transportation;Assistance with cooking/housework    Equipment Recommendations Other (comment) (defer to post acute)  Recommendations for Other Services        Functional Status Assessment Patient has had a recent decline in their functional status and demonstrates the ability to make significant improvements in function in a reasonable and predictable amount of time.     Precautions / Restrictions Precautions Precautions: Fall Restrictions Weight Bearing Restrictions: Yes RLE Weight Bearing: Weight bearing as tolerated Other Position/Activity Restrictions: transfer only      Mobility  Bed Mobility Overal bed mobility: Needs Assistance Bed Mobility: Supine to Sit, Sit to Supine     Supine to sit: Max assist, +2 for physical assistance Sit to supine: Max assist, +2 for physical assistance   General bed mobility comments: Pt was Max A +2 for the trunk and LE for supine<>sitting. Pt has pain in her R humerus due to previous fracture in January. RLE needed to be support at the ankle and knee throughout movement to prevent yelling out. Verbal/tactile cues for progressing LE to the EOB and for reaching across the body to assist with bed mobility with very little physical assist provided from patient. Patient Response: Anxious  Transfers Overall transfer level: Needs assistance Equipment used: Rolling walker (2 wheels) Transfers: Sit to/from Stand Sit to Stand: +2 physical assistance, Max assist, +2 safety/equipment           General transfer comment: Pt requires Max A managing AD, blocking AD, physical assist for momentum to go from sitting EOB to standing with Mod-Max a once in standing to prevent posterior fall due to pt was keeping COM posterior to BOS with difficutly correcting alignment despite heavy cueing.    Ambulation/Gait               General Gait Details: Unable to progress gait today due to current  functional status and WB precautions for transfers only      Balance Overall balance assessment: Needs assistance Sitting-balance support: Bilateral upper extremity supported Sitting balance-Leahy Scale: Fair Sitting  balance - Comments: No significant LOB. pt is tense with bil UE support on EOB when sitting EOB. Slightly impulsive trying to lay down wtihout notice requiring verbal cues to prevent pt from suddenly laying back. Postural control: Posterior lean Standing balance support: Bilateral upper extremity supported Standing balance-Leahy Scale: Zero Standing balance comment: Mod-Max A once in standing to maintain standing and prevent posterior fall.           Pertinent Vitals/Pain Pain Assessment Pain Assessment: 0-10 Pain Score: 10-Worst pain ever Pain Descriptors / Indicators: Aching, Operative site guarding Pain Intervention(s): Monitored during session, Limited activity within patient's tolerance    Home Living Family/patient expects to be discharged to:: Private residence Living Arrangements: Spouse/significant other Available Help at Discharge: Available PRN/intermittently Type of Home: House Home Access: Stairs to enter Entrance Stairs-Rails: None Entrance Stairs-Number of Steps: 1   Home Layout: One level Home Equipment: Shower seat;Wheelchair Probation officer (4 wheels)      Prior Function Prior Level of Function : Needs assist       Physical Assist : Mobility (physical);ADLs (physical) Mobility (physical): Gait;Bed mobility;Transfers;Stairs ADLs (physical): Bathing;Dressing;IADLs Mobility Comments: household ambulator using RW with assistance from spouse. ADLs Comments: Was having assist for ADL's from spouse     Hand Dominance   Dominant Hand: Right    Extremity/Trunk Assessment   Upper Extremity Assessment Upper Extremity Assessment: Defer to OT evaluation    Lower Extremity Assessment Lower Extremity Assessment: RLE deficits/detail;Generalized weakness RLE Deficits / Details: post op ORIF of the R femur    Cervical / Trunk Assessment Cervical / Trunk Assessment: Normal  Communication   Communication: No difficulties  Cognition Arousal/Alertness:  Awake/alert Behavior During Therapy: WFL for tasks assessed/performed Overall Cognitive Status: Within Functional Limits for tasks assessed           General Comments: Pt seemed to have some difficutly understanding that she would have pain following a fracture and surgery. Slightly resistant to mobility.        General Comments General comments (skin integrity, edema, etc.): Pt was able to answer questions about home set up and PLOF. Is demonstrating possibly some cognitive issues related to understanding pain and mobility following surgery. Pt had difficulty following directions.        Assessment/Plan    PT Assessment Patient needs continued PT services  PT Problem List Decreased strength;Decreased mobility;Decreased activity tolerance;Decreased balance;Decreased safety awareness;Pain       PT Treatment Interventions DME instruction;Therapeutic exercise;Balance training;Manual techniques;Neuromuscular re-education;Functional mobility training;Therapeutic activities;Patient/family education    PT Goals (Current goals can be found in the Care Plan section)  Acute Rehab PT Goals Patient Stated Goal: Pt wants to decrease pain PT Goal Formulation: With patient Time For Goal Achievement: 12/29/22 Potential to Achieve Goals: Fair Additional Goals Additional Goal #1: Will wheel herself 150 ft in W/C managing brakes, arm rests and leg rests in order to increase independent mobility    Frequency Min 3X/week     Co-evaluation PT/OT/SLP Co-Evaluation/Treatment: Yes Reason for Co-Treatment: Necessary to address cognition/behavior during functional activity;For patient/therapist safety;To address functional/ADL transfers PT goals addressed during session: Mobility/safety with mobility;Balance;Proper use of DME         AM-PAC PT "6 Clicks" Mobility  Outcome Measure Help needed turning from your back to your side while in a flat bed  without using bedrails?: Total Help needed  moving from lying on your back to sitting on the side of a flat bed without using bedrails?: Total Help needed moving to and from a bed to a chair (including a wheelchair)?: Total Help needed standing up from a chair using your arms (e.g., wheelchair or bedside chair)?: Total Help needed to walk in hospital room?: Total Help needed climbing 3-5 steps with a railing? : Total 6 Click Score: 6    End of Session Equipment Utilized During Treatment: Gait belt Activity Tolerance: Patient limited by pain Patient left: in bed;with call bell/phone within reach;with bed alarm set Nurse Communication: Mobility status PT Visit Diagnosis: Unsteadiness on feet (R26.81);Other abnormalities of gait and mobility (R26.89);Muscle weakness (generalized) (M62.81);History of falling (Z91.81)    Time: [id number] PT Time Calculation (min) (ACUTE ONLY): 27 min   Charges:   PT Evaluation $PT Eval Low Complexity: [place], DPT, CLT  Acute Rehabilitation Services Office: [phone] (Secure chat preferred)   Ander Purpura 12/15/2022, 5:11 PM

## 2022-12-15 NOTE — Progress Notes (Signed)
ORTHOPAEDIC PROGRESS NOTE  s/p Procedure(s): OPEN REDUCTION INTERNAL FIXATION (ORIF) DISTAL FEMUR FRACTURE on 12/14/22 with Dr. Doreatha Martin  SUBJECTIVE: Reports moderate pain about operative site. She cannot get comfortable.   OBJECTIVE: PE: General: sitting up in hospital bed, NAD RLE: dressings CDI, intact EHL/TA/GSC, leg lengths equal, warm well perfused foot   Vitals:   12/15/22 0100 12/15/22 0756  BP: (!) 145/67 (!) 141/61  Pulse: (!) 101 93  Resp:  17  Temp: 98.2 F (36.8 C) 98.3 F (36.8 C)  SpO2: 97% 100%     ASSESSMENT: Heather [name] is a 72 y.o. female POD#1  PLAN: Weightbearing: WBAT RLE for transfers only Insicional and dressing care: Reinforce dressings as needed Orthopedic device(s): None Showering: Hold for now VTE prophylaxis: Lovenox 30 mg QD Pain control: PRN pain medications, minimize narcotics as able  Dispo: TBD. PT/OT evals today.    Follow - up plan: 2 weeks after discharge for wound check and repeat x-rays   Contact information: After hours and holidays please check [email or web address] for group call information for [facility]   Noemi Chapel, PA-C 12/15/22

## 2022-12-15 NOTE — Evaluation (Signed)
Occupational Therapy Evaluation Patient Details Name: Heather [name] MRN: [id number] DOB: April 28, 1951 Today's Date: 12/15/2022   History of Present Illness Pt is 72 yo female presenting post fall after rolling out of the bed. Upon arrival to ED found to have R distal femur fracture. Pt was admitted in mid January for R humeral neck fracture. Pt is currently post op ORIF of R supracondylar femur fracture. Pt is WB for transfers only. PMH: recurrent falls, DM II, HTN, CHF, CKD IV, Anemia.   Clinical Impression   PTA, pt reports she lived with her husband who assisted with ADL and IADL. Upon eval, pr presents with pain, weakness, decreased balance, safety. Pt requires max A +2 for UB ADL and min guard A for UB ADL. During session, pt requiring VC for sequencing and safety and seemed to have some difficulty understaning healing process due to reports of inability to understand why she was in pain since sh has had surgery now. Recommending discharge at SNF for continued OT services to optimize safety and independence in ADL.      Recommendations for follow up therapy are one component of a multi-disciplinary discharge planning process, led by the attending physician.  Recommendations may be updated based on patient status, additional functional criteria and insurance authorization.   Follow Up Recommendations  Skilled nursing-short term rehab (<3 hours/day)     Assistance Recommended at Discharge Frequent or constant Supervision/Assistance  Patient can return home with the following Two people to help with walking and/or transfers;Two people to help with bathing/dressing/bathroom;Assistance with cooking/housework;Direct supervision/assist for medications management;Direct supervision/assist for financial management;Assist for transportation;Help with stairs or ramp for entrance    Functional Status Assessment  Patient has had a recent decline in their functional status and demonstrates the  ability to make significant improvements in function in a reasonable and predictable amount of time.  Equipment Recommendations  Other (comment) (defer)    Recommendations for Other Services       Precautions / Restrictions Precautions Precautions: Fall Restrictions Weight Bearing Restrictions: Yes RLE Weight Bearing: Weight bearing as tolerated Other Position/Activity Restrictions: transfer only      Mobility Bed Mobility Overal bed mobility: Needs Assistance Bed Mobility: Supine to Sit, Sit to Supine     Supine to sit: Max assist, +2 for physical assistance Sit to supine: Max assist, +2 for physical assistance   General bed mobility comments: Pt was Max A +2 for the trunk and LE for supine<>sitting. Pt has pain in her R humerus due to previous fracture in January. RLE needed to be support at the ankle and knee throughout movement to prevent yelling out. Verbal/tactile cues for progressing LE to the EOB and for reaching across the body to assist with bed mobility with very little physical assist provided from patient.    Transfers Overall transfer level: Needs assistance Equipment used: Rolling walker (2 wheels) Transfers: Sit to/from Stand Sit to Stand: +2 physical assistance, Max assist, +2 safety/equipment           General transfer comment: Pt requires Max A managing AD, blocking AD, physical assist for momentum to go from sitting EOB to standing with Mod-Max a once in standing to prevent posterior fall due to pt was keeping COM posterior to BOS with difficutly correcting alignment despite heavy cueing.      Balance Overall balance assessment: Needs assistance Sitting-balance support: Bilateral upper extremity supported Sitting balance-Leahy Scale: Fair Sitting balance - Comments: No significant LOB. pt is tense with bil UE  support on EOB when sitting EOB. Slightly impulsive trying to lay down wtihout notice requiring verbal cues to prevent pt from suddenly laying  back. Postural control: Posterior lean Standing balance support: Bilateral upper extremity supported Standing balance-Leahy Scale: Zero Standing balance comment: Mod-Max A once in standing to maintain standing and prevent posterior fall.                           ADL either performed or assessed with clinical judgement   ADL Overall ADL's : Needs assistance/impaired Eating/Feeding: Set up;Bed level   Grooming: Sitting;Min guard   Upper Body Bathing: Min guard;Sitting   Lower Body Bathing: Maximal assistance;+2 for physical assistance;+2 for safety/equipment;Sit to/from stand   Upper Body Dressing : Sitting;Min guard   Lower Body Dressing: Maximal assistance;+2 for safety/equipment;+2 for physical assistance;Sit to/from stand   Toilet Transfer: Maximal assistance;+2 for physical assistance;+2 for safety/equipment;Rolling walker (2 wheels) Toilet Transfer Details (indicate cue type and reason): STS Toileting- Clothing Manipulation and Hygiene: Total assistance;+2 for physical assistance;+2 for safety/equipment;Sit to/from stand       Functional mobility during ADLs: Maximal assistance;+2 for physical assistance;+2 for safety/equipment;Rolling walker (2 wheels) General ADL Comments: STS     Vision Ability to See in Adequate Light: 0 Adequate Patient Visual Report: No change from baseline Vision Assessment?: No apparent visual deficits     Perception     Praxis      Pertinent Vitals/Pain Pain Assessment Pain Assessment: 0-10 Pain Score: 10-Worst pain ever Pain Location: RLE Pain Descriptors / Indicators: Aching, Operative site guarding Pain Intervention(s): Limited activity within patient's tolerance, Monitored during session     Hand Dominance Right   Extremity/Trunk Assessment Upper Extremity Assessment Upper Extremity Assessment: Generalized weakness   Lower Extremity Assessment Lower Extremity Assessment: Defer to PT evaluation RLE Deficits /  Details: post op ORIF of the R femur   Cervical / Trunk Assessment Cervical / Trunk Assessment: Normal   Communication Communication Communication: No difficulties   Cognition Arousal/Alertness: Awake/alert Behavior During Therapy: WFL for tasks assessed/performed Overall Cognitive Status: Impaired/Different from baseline Area of Impairment: Following commands, Safety/judgement, Awareness, Problem solving                       Following Commands: Follows one step commands consistently, Follows one step commands with increased time Safety/Judgement: Decreased awareness of safety, Decreased awareness of deficits Awareness: Intellectual Problem Solving: Slow processing General Comments: Pt seemed to have some difficutly understanding that she would have pain following a fracture and surgery. Slightly resistant to mobility.     General Comments  Pt was able to answer questions about home set up and PLOF. Is demonstrating possibly some cognitive issues related to understanding pain and mobility following surgery. Pt had difficulty following directions.    Exercises     Shoulder Instructions      Home Living Family/patient expects to be discharged to:: Private residence Living Arrangements: Spouse/significant other Available Help at Discharge: Available PRN/intermittently Type of Home: House Home Access: Stairs to enter [employer] of Steps: 1 Entrance Stairs-Rails: None Home Layout: One level     Bathroom Shower/Tub: Teacher, early years/pre: Handicapped height Bathroom Accessibility: Yes How Accessible: Accessible via walker Home Equipment: Shower seat;Wheelchair Probation officer (4 wheels)          Prior Functioning/Environment Prior Level of Function : Needs assist       Physical Assist : Mobility (physical);ADLs (physical)  Mobility (physical): Gait;Bed mobility;Transfers;Stairs ADLs (physical): Bathing;Dressing;IADLs Mobility  Comments: household ambulator using RW with assistance from spouse. ADLs Comments: Was having assist for ADL's from spouse; pt reports he helps with everything a lot        OT Problem List: Decreased strength;Decreased activity tolerance;Impaired balance (sitting and/or standing);Decreased cognition;Decreased safety awareness;Decreased knowledge of use of DME or AE;Decreased knowledge of precautions;Pain      OT Treatment/Interventions: Self-care/ADL training;Therapeutic exercise;DME and/or AE instruction;Therapeutic activities;Cognitive remediation/compensation;Balance training;Patient/family education    OT Goals(Current goals can be found in the care plan section) Acute Rehab OT Goals Patient Stated Goal: no pain OT Goal Formulation: With patient Time For Goal Achievement: 12/29/22 Potential to Achieve Goals: Good  OT Frequency: Min 2X/week    Co-evaluation PT/OT/SLP Co-Evaluation/Treatment: Yes Reason for Co-Treatment: Necessary to address cognition/behavior during functional activity;For patient/therapist safety;To address functional/ADL transfers PT goals addressed during session: Mobility/safety with mobility;Balance;Proper use of DME OT goals addressed during session: ADL's and self-care;Proper use of Adaptive equipment and DME      AM-PAC OT "6 Clicks" Daily Activity     Outcome Measure Help from another person eating meals?: None Help from another person taking care of personal grooming?: A Little Help from another person toileting, which includes using toliet, bedpan, or urinal?: A Lot Help from another person bathing (including washing, rinsing, drying)?: A Lot Help from another person to put on and taking off regular upper body clothing?: A Little Help from another person to put on and taking off regular lower body clothing?: A Lot 6 Click Score: 16   End of Session Equipment Utilized During Treatment: Gait belt;Rolling walker (2 wheels) Nurse Communication: Mobility  status  Activity Tolerance: Patient tolerated treatment well Patient left: in bed;with call bell/phone within reach;with bed alarm set  OT Visit Diagnosis: Unsteadiness on feet (R26.81);Muscle weakness (generalized) (M62.81);Other abnormalities of gait and mobility (R26.89);Other symptoms and signs involving cognitive function;Pain Pain - Right/Left: Right Pain - part of body: Leg;Knee                Time: [id number] OT Time Calculation (min): 27 min Charges:  OT General Charges $OT Visit: 1 Visit OT Evaluation $OT Eval Low Complexity: 1 Low  Elder Cyphers, OTR/L [facility] Acute Rehabilitation Office: [phone]   Magnus Ivan 12/15/2022, 5:18 PM

## 2022-12-15 NOTE — Progress Notes (Signed)
PROGRESS NOTE  Heather [name] [id number] DOB: June 25, 1951 DOA: 12/11/2022 PCP: [name], MD   LOS: 4 days   Brief Narrative / Interim history: 72 year old female with DM2, HTN, CKD 4, chronic diastolic CHF comes into the hospital with a fall.  She has a history of frequent falls, she is supposed to use a walker ambulation, and on the day of admission she rolled out of bed and fell onto the floor.  She was unable to get up.  In the ED she was found to have right distal femur fracture.  She was admitted to the hospital and orthopedic surgery consulted.  Of note, she had a recent hospitalization in mid January for HHS with a right humeral neck fracture  Subjective / 24h Interval events: Continues to complain of hip pain, but better.  No chest discomfort, no shortness of breath, no abdominal pain, no nausea or vomiting  Assesement and Plan: Principal Problem:   Fracture Active Problems:   Recurrent falls   Uncontrolled type 2 diabetes mellitus with hyperglycemia, with long-term current use of insulin (HCC)   Hypertension   Chronic diastolic CHF (congestive heart failure) (HCC)   Chronic kidney disease (CKD), stage IV (severe) (HCC)   Anemia due to chronic kidney disease  Principal problem Right distal femur fracture -orthopedic surgery consult, appreciate input.  She is status post ORIF of the right supracondylar distal femur fracture and removal of hardware of the right femur by Dr. Doreatha Martin on 12/14/2022.  Monitor postoperatively, PT evaluation today  Active problems Essential hypertension-continue home metoprolol, hydralazine, blood pressure acceptable  Chronic diastolic CHF -she appears euvolemic today, last echo October 2022, EF 60-65%  CKD stage IV -variable baseline, 2.5-3.5.  Currently at baseline  Hyperkalemia -due to CKD, Lokelma x 1  UTI - UA suggestive on infection and she is symptomatic with pain / burning with urination.  Continue ceftriaxone today day #4,  culture showing lactobacillus.  Continue antibiotics for 1 more day  Hypomagnesemia-cautiously replete again magnesium this morning  Hyperkalemia-mild, due to chronic kidney disease.  Recheck potassium in the morning  Anemia of chronic renal disease -there is no bleeding, hemoglobin dipped to 6.6 2/15.  Transfused 1 unit of packed red blood cells.  Improved appropriately, stable, 8.3 this morning  DM2, uncontrolled, with hyperglycemia -A1c over 15.  CBGs controlled while here  CBG (last 3)  Recent Labs    12/15/22 0127 12/15/22 0409 12/15/22 0753  GLUCAP 152* 117* 77    Scheduled Meds:  allopurinol  100 mg Oral Daily   atorvastatin  80 mg Oral Daily   Chlorhexidine Gluconate Cloth  6 each Topical Daily   docusate sodium  100 mg Oral BID   [START ON 12/16/2022] enoxaparin (LOVENOX) injection  30 mg Subcutaneous Q24H   gabapentin  300 mg Oral QHS   hydrALAZINE  50 mg Oral Q8H   insulin aspart  0-15 Units Subcutaneous Q4H   levETIRAcetam  500 mg Oral BID   lipase/protease/amylase  36,000 Units Oral TID WC   metoprolol succinate  37.5 mg Oral Daily   venlafaxine XR  150 mg Oral Daily   Continuous Infusions:  sodium chloride 50 mL/hr at 12/15/22 [id number]   cefTRIAXone (ROCEPHIN)  IV Stopped (12/14/22 1958)   methocarbamol (ROBAXIN) IV     PRN Meds:.acetaminophen **OR** acetaminophen, diphenhydrAMINE, HYDROmorphone (DILAUDID) injection, methocarbamol **OR** methocarbamol (ROBAXIN) IV, metoCLOPramide **OR** metoCLOPramide (REGLAN) injection, ondansetron **OR** ondansetron (ZOFRAN) IV, oxyCODONE, polyethylene glycol  Current Outpatient Medications  Medication Instructions  acetaminophen (TYLENOL) 650 mg, Oral, Every 4 hours PRN   allopurinol (ZYLOPRIM) 100 mg, Oral, Daily   atorvastatin (LIPITOR) 80 mg, Oral, Daily, Restart on 06/29/22   diphenhydrAMINE (BENADRYL) 2 % cream Topical, 3 times daily PRN   furosemide (LASIX) 40 MG tablet TAKE 1 TABLET BY MOUTH ONCE DAILY ALONG WITH 20MG  TABLET FOR INCREASED FLUID RETENTION   gabapentin (NEURONTIN) 300 MG capsule 1 capsule, Oral, Daily at bedtime   hydrALAZINE (APRESOLINE) 50 mg, Oral, Every 8 hours   insulin aspart (NOVOLOG) 5 Units, Subcutaneous, 3 times daily with meals, Give only if eats 50% or more of meal.   insulin glargine-yfgn (SEMGLEE) 100 UNIT/ML Pen INJECT 15 UNITS UNDER THE SKIN ONCE DAILY   levETIRAcetam (KEPPRA) 500 mg, Oral, 2 times daily   lipase/protease/amylase (CREON) 36,000 Units, Oral, 3 times daily with meals   methocarbamol (ROBAXIN) 500 mg, Oral, Every 8 hours PRN   metoprolol succinate (TOPROL-XL) 37.5 mg, Oral, Daily   naloxone (NARCAN) nasal spray 4 mg/0.1 mL 1 spray, Nasal,  Once   naproxen sodium (ALEVE) 440 mg, Oral, Daily PRN   polyethylene glycol (MIRALAX / GLYCOLAX) 17 g, Oral, Daily   traMADol (ULTRAM) 50 mg, Oral, 2 times daily   venlafaxine XR (EFFEXOR-XR) 150 mg, Oral, Daily    Diet Orders (From admission, onward)     Start     Ordered   12/14/22 1708  Diet Carb Modified Fluid consistency: Thin; Room service appropriate? Yes  Diet effective now       Question Answer Comment  Calorie Level Medium 1600-2000   Fluid consistency: Thin   Room service appropriate? Yes      12/14/22 1707            DVT prophylaxis: enoxaparin (LOVENOX) injection 30 mg Start: 12/16/22 0800 SCDs Start: 12/14/22 1708 SCDs Start: 12/12/22 0033   Lab Results  Component Value Date   PLT 219 12/15/2022      Code Status: Full Code  Family Communication: no family at bedside   Status is: Inpatient  Remains inpatient appropriate because: severity of illness  Level of care: Med-Surg  Consultants:  Orthopedic surgery   Objective: Vitals:   12/14/22 2100 12/15/22 0100 12/15/22 0756 12/15/22 1013  BP: (!) 132/51 (!) 145/67 (!) 141/61 (!) 148/72  Pulse: 100 (!) 101 93 95  Resp:   17   Temp: 98.7 F (37.1 C) 98.2 F (36.8 C) 98.3 F (36.8 C)   TempSrc: Oral Oral Oral   SpO2: 99% 97%  100%   Weight:      Height:        Intake/Output Summary (Last 24 hours) at 12/15/2022 1126 Last data filed at 12/15/2022 0801 Gross per 24 hour  Intake 1229.17 ml  Output 1050 ml  Net 179.17 ml    Wt Readings from Last 3 Encounters:  12/12/22 52 kg  11/12/22 51.3 kg  11/01/22 51.6 kg    Examination:  Constitutional: NAD Eyes: lids and conjunctivae normal, no scleral icterus ENMT: mmm Neck: normal, supple Respiratory: clear to auscultation bilaterally, no wheezing, no crackles. Normal respiratory effort.  Cardiovascular: Regular rate and rhythm, no murmurs / rubs / gallops. No LE edema. Abdomen: soft, no distention, no tenderness. Bowel sounds positive.  Skin: no rashes Neurologic: no focal deficits, equal strength  Data Reviewed: I have independently reviewed following labs and imaging studies   CBC Recent Labs  Lab 12/11/22 1737 12/12/22 0253 12/13/22 0445 12/13/22 2010 12/15/22 0318  WBC 9.5 10.2  11.5*  --  11.2*  HGB 7.8* 7.8* 6.6* 8.3* 8.3*  HCT 24.9* 24.3* 19.5* 24.2* 25.0*  PLT 255 269 181  --  219  MCV 97.6 94.6 92.9  --  93.3  MCH 30.6 30.4 31.4  --  31.0  MCHC 31.3 32.1 33.8  --  33.2  RDW 17.3* 17.1* 17.3*  --  17.2*  LYMPHSABS 0.6*  --   --   --   --   MONOABS 0.3  --   --   --   --   EOSABS 0.1  --   --   --   --   BASOSABS 0.1  --   --   --   --      Recent Labs  Lab 12/11/22 1737 12/12/22 0253 12/13/22 0445 12/15/22 0318  NA 136 136 134* 135  K 5.5* 5.3* 4.8 5.2*  CL 109 112* 110 110  CO2 16* 15* 13* 14*  GLUCOSE 211* 79 125* 153*  BUN 57* 60* 64* 54*  CREATININE 2.12* 2.30* 2.66* 2.42*  CALCIUM 9.0 8.5* 8.3* 8.5*  AST  --   --  17 21  ALT  --   --  17 10  ALKPHOS  --   --  105 111  BILITOT  --   --  0.5 0.2*  ALBUMIN  --   --  2.1* 2.2*  MG  --   --  1.2* 1.6*  INR 1.1  --   --   --      ------------------------------------------------------------------------------------------------------------------ No results for  input(s): "CHOL", "HDL", "LDLCALC", "TRIG", "CHOLHDL", "LDLDIRECT" in the last 72 hours.  Lab Results  Component Value Date   HGBA1C >15.5 (H) 10/17/2022   ------------------------------------------------------------------------------------------------------------------ No results for input(s): "TSH", "T4TOTAL", "T3FREE", "THYROIDAB" in the last 72 hours.  Invalid input(s): "FREET3"  Cardiac Enzymes No results for input(s): "CKMB", "TROPONINI", "MYOGLOBIN" in the last 168 hours.  Invalid input(s): "CK" ------------------------------------------------------------------------------------------------------------------    Component Value Date/Time   BNP 430.0 (H) 08/19/2021 0728    CBG: Recent Labs  Lab 12/14/22 2012 12/15/22 0000 12/15/22 0127 12/15/22 0409 12/15/22 0753  GLUCAP 185* 184* 152* 117* 77     Recent Results (from the past 240 hour(s))  Urine Culture (for pregnant, neutropenic or urologic patients or patients with an indwelling urinary catheter)     Status: Abnormal   Collection Time: 12/12/22  5:29 PM   Specimen: Urine, Catheterized  Result Value Ref Range Status   Specimen Description URINE, CATHETERIZED  Final   Special Requests NONE  Final   Culture (A)  Final    >=100,000 COLONIES/mL LACTOBACILLUS SPECIES Standardized susceptibility testing for this organism is not available. Performed at [facility], [place] [address], [place],  [address]    Report Status 12/13/2022 FINAL  Final     Radiology Studies: DG FEMUR PORT, MIN 2 VIEWS RIGHT  Result Date: 12/14/2022 CLINICAL DATA:  [id number] Fracture, femur (Smith) [id number] EXAM: RIGHT FEMUR PORTABLE 2 VIEW COMPARISON:  12/11/2012 FINDINGS: Interval lateral plate and screw fixation of oblique distal femoral fracture, fragments in near anatomic alignment. Slight screw and IM rod across the right femoral neck, with interval removal of the interlocking screw. Knee arthroplasty components stable in location.  Extensive femoropopliteal arterial calcifications. IMPRESSION: Interval plate and screw fixation of distal femoral fracture, in near anatomic alignment. Electronically Signed   By: Lucrezia Europe M.D.   On: 12/14/2022 17:43   DG Knee Complete 4 Views Right  Result  Date: 12/14/2022 CLINICAL DATA:  Surgical internal fixation of distal right femoral fracture. EXAM: RIGHT KNEE - COMPLETE 4+ VIEW; DG C-ARM 1-60 MIN-NO REPORT Radiation exposure index: 4.41 mGy. COMPARISON:  December 11, 2022. FINDINGS: Six intraoperative fluoroscopic images were obtained of the right femur. These demonstrate surgical internal fixation of distal right femoral fracture. IMPRESSION: Fluoroscopic guidance provided during surgical internal fixation of distal right femoral fracture. Electronically Signed   By: Marijo Conception M.D.   On: 12/14/2022 15:45   DG C-Arm 1-60 Min-No Report  Result Date: 12/14/2022 Fluoroscopy was utilized by the requesting physician.  No radiographic interpretation.     Marzetta Board, MD, PhD [facility]  Between 7 am - 7 pm I am available, please contact me via Amion (for emergencies) or Securechat (non urgent messages)  Between 7 pm - 7 am I am not available, please contact night coverage MD/APP via Amion

## 2022-12-16 DIAGNOSIS — T148XXA Other injury of unspecified body region, initial encounter: Secondary | ICD-10-CM | POA: Diagnosis not present

## 2022-12-16 LAB — GLUCOSE, CAPILLARY
Glucose-Capillary: 101 mg/dL — ABNORMAL HIGH (ref 70–99)
Glucose-Capillary: 72 mg/dL (ref 70–99)
Glucose-Capillary: 153 mg/dL — ABNORMAL HIGH (ref 70–99)
Glucose-Capillary: 166 mg/dL — ABNORMAL HIGH (ref 70–99)
Glucose-Capillary: 139 mg/dL — ABNORMAL HIGH (ref 70–99)
Glucose-Capillary: 188 mg/dL — ABNORMAL HIGH (ref 70–99)
Glucose-Capillary: 276 mg/dL — ABNORMAL HIGH (ref 70–99)

## 2022-12-16 LAB — CBC
HCT: 21.4 % — ABNORMAL LOW (ref 36.0–46.0)
Hemoglobin: 6.8 g/dL — CL (ref 12.0–15.0)
MCH: 30.2 pg (ref 26.0–34.0)
MCHC: 31.8 g/dL (ref 30.0–36.0)
MCV: 95.1 fL (ref 80.0–100.0)
Platelets: 172 10*3/uL (ref 150–400)
RBC: 2.25 MIL/uL — ABNORMAL LOW (ref 3.87–5.11)
RDW: 16.7 % — ABNORMAL HIGH (ref 11.5–15.5)
WBC: 8.8 10*3/uL (ref 4.0–10.5)
nRBC: 0 % (ref 0.0–0.2)

## 2022-12-16 LAB — BASIC METABOLIC PANEL
Anion gap: 12 (ref 5–15)
BUN: 49 mg/dL — ABNORMAL HIGH (ref 8–23)
CO2: 14 mmol/L — ABNORMAL LOW (ref 22–32)
Calcium: 8.4 mg/dL — ABNORMAL LOW (ref 8.9–10.3)
Chloride: 109 mmol/L (ref 98–111)
Creatinine, Ser: 2.24 mg/dL — ABNORMAL HIGH (ref 0.44–1.00)
GFR, Estimated: 23 mL/min — ABNORMAL LOW (ref >60)
Glucose, Bld: 70 mg/dL (ref 70–99)
Potassium: 5.1 mmol/L (ref 3.5–5.1)
Sodium: 135 mmol/L (ref 135–145)

## 2022-12-16 LAB — HEMOGLOBIN AND HEMATOCRIT, BLOOD
HCT: 24.5 % — ABNORMAL LOW (ref 36.0–46.0)
Hemoglobin: 8.2 g/dL — ABNORMAL LOW (ref 12.0–15.0)

## 2022-12-16 LAB — TROPONIN I (HIGH SENSITIVITY): Troponin I (High Sensitivity): 233 ng/L (ref <18)

## 2022-12-16 LAB — MAGNESIUM: Magnesium: 1.8 mg/dL (ref 1.7–2.4)

## 2022-12-16 MED ORDER — ACETAMINOPHEN 500 MG PO TABS
1000.0000 mg | ORAL_TABLET | Freq: Three times a day (TID) | ORAL | Status: AC
  Administered 2022-12-16 – 2022-12-18 (×6): 1000 mg via ORAL
  Filled 2022-12-16 (×6): qty 2

## 2022-12-16 MED ORDER — METHOCARBAMOL 1000 MG/10ML IJ SOLN: 500.0000 mg | Freq: Four times a day (QID) | INTRAVENOUS | Status: DC | PRN

## 2022-12-16 MED ORDER — SODIUM CHLORIDE 0.9% IV SOLUTION: Freq: Once | INTRAVENOUS | Status: AC

## 2022-12-16 MED ORDER — OXYCODONE HCL 5 MG PO TABS
5.0000 mg | ORAL_TABLET | ORAL | Status: DC | PRN
  Administered 2022-12-17: 5 mg via ORAL
  Filled 2022-12-16: qty 1

## 2022-12-16 MED ORDER — METHOCARBAMOL 750 MG PO TABS
750.0000 mg | ORAL_TABLET | Freq: Four times a day (QID) | ORAL | Status: DC | PRN
  Administered 2022-12-16 – 2022-12-18 (×5): 750 mg via ORAL
  Filled 2022-12-16 (×5): qty 1

## 2022-12-16 MED ORDER — HYDROMORPHONE HCL 1 MG/ML IJ SOLN
0.5000 mg | INTRAMUSCULAR | Status: DC | PRN
  Administered 2022-12-16: 0.5 mg via INTRAVENOUS
  Filled 2022-12-16: qty 0.5

## 2022-12-16 MED ORDER — OXYCODONE HCL 5 MG PO TABS
10.0000 mg | ORAL_TABLET | ORAL | Status: DC | PRN
  Administered 2022-12-16 – 2022-12-18 (×4): 10 mg via ORAL
  Filled 2022-12-16 (×4): qty 2

## 2022-12-16 NOTE — Progress Notes (Signed)
ORTHOPAEDIC PROGRESS NOTE  s/p Procedure(s): OPEN REDUCTION INTERNAL FIXATION (ORIF) DISTAL FEMUR FRACTURE on 12/14/22 with Dr. Doreatha Martin  SUBJECTIVE: Patient still having a lot of pain in her right leg. She states she cannot get relief. Will adjust medications for more adequate pain control.   OBJECTIVE: PE: General: sitting up in hospital bed, NAD RLE: dressings CDI, intact EHL/TA/GSC, leg lengths equal, warm well perfused foot   Vitals:   12/16/22 0001 12/16/22 0438  BP: (!) 145/62 (!) 154/62  Pulse: 89 98  Resp: 15 17  Temp: 97.7 F (36.5 C) 98.4 F (36.9 C)  SpO2: 100% 99%   CV/Blood loss: Acute blood loss anemia, Hgb 6.8 this morning. Receiving pRBCs.   ASSESSMENT: Heather [name] is a 72 y.o. female POD#2  PLAN: Weightbearing: WBAT RLE for transfers only Insicional and dressing care: Reinforce dressings as needed Orthopedic device(s): None Showering: Hold for now VTE prophylaxis: Lovenox 30 mg QD Pain control: PRN pain medications, minimize narcotics as able - Scheduled Tylenol 1000 mevery every 8 hours - Oxycodone 5-10 mg every 4 hours as needed  - Dilaudid 0.5 mg every 4 hours as needed for severe pain - Robaxin 750 mg every 8 hours as needed for muscle spasms  Dispo: TBD. PT/OT recommending SNF.   Follow - up plan: 2 weeks after discharge for wound check and repeat x-rays   Contact information: After hours and holidays please check [email or web address] for group call information for [facility]   Noemi Chapel, PA-C 12/16/22

## 2022-12-16 NOTE — Progress Notes (Signed)
PROGRESS NOTE  Heather [name] [id number] DOB: [birth year] DOA: 12/11/2022 PCP: [name], MD   LOS: 5 days   Brief Narrative / Interim history: 72 year old female with DM2, HTN, CKD 4, chronic diastolic CHF comes into the hospital with a fall.  She has a history of frequent falls, she is supposed to use a walker ambulation, and on the day of admission she rolled out of bed and fell onto the floor.  She was unable to get up.  In the ED she was found to have right distal femur fracture.  She was admitted to the hospital and orthopedic surgery consulted.  Of note, she had a recent hospitalization in mid January for HHS with a right humeral neck fracture  Subjective / 24h Interval events: Had some chest pain last night which lasted 1 to 2 minutes, with inspiration.  Resolved  Assesement and Plan: Principal Problem:   Fracture Active Problems:   Recurrent falls   Uncontrolled type 2 diabetes mellitus with hyperglycemia, with long-term current use of insulin (HCC)   Hypertension   Chronic diastolic CHF (congestive heart failure) (HCC)   Chronic kidney disease (CKD), stage IV (severe) (HCC)   Anemia due to chronic kidney disease  Principal problem Right distal femur fracture -orthopedic surgery consult, appreciate input.  She is status post ORIF of the right supracondylar distal femur fracture and removal of hardware of the right femur by Dr. Doreatha Martin on 12/14/2022.  Monitor postoperatively, PT evaluation today  Active problems Essential hypertension-continue home metoprolol, hydralazine, blood pressure acceptable  Chronic diastolic CHF -she appears euvolemic today, last echo October 2022, EF 60-65%  CKD stage IV -variable baseline, 2.5-3.5.  At baseline  UTI - UA suggestive on infection and she is symptomatic with pain / burning with urination.  Continue ceftriaxone today day #4, culture showing lactobacillus.  Continue antibiotics for 1 more day  Hypomagnesemia-magnesium is able  this morning  Hyperkalemia-mild, due to chronic kidney disease.  Potassium stable today  Anemia of chronic renal disease, acute blood loss anemia-there is no bleeding, hemoglobin dipped to 6.6 2/15.  Transfused 1 unit of packed red blood cells and improved appropriately.  Dropping again to 6.8 this morning, transfused an additional unit of packed red blood cells  DM2, uncontrolled, with hyperglycemia -A1c over 15.  CBGs controlled while here  CBG (last 3)  Recent Labs    12/16/22 0130 12/16/22 0441 12/16/22 0747  GLUCAP 101* 72 153*    Scheduled Meds:  allopurinol  100 mg Oral Daily   atorvastatin  80 mg Oral Daily   Chlorhexidine Gluconate Cloth  6 each Topical Daily   docusate sodium  100 mg Oral BID   enoxaparin (LOVENOX) injection  30 mg Subcutaneous Q24H   gabapentin  300 mg Oral QHS   hydrALAZINE  50 mg Oral Q8H   insulin aspart  0-15 Units Subcutaneous Q4H   levETIRAcetam  500 mg Oral BID   lipase/protease/amylase  36,000 Units Oral TID WC   metoprolol succinate  37.5 mg Oral Daily   venlafaxine XR  150 mg Oral Daily   Continuous Infusions:  sodium chloride 50 mL/hr at 12/16/22 0135   cefTRIAXone (ROCEPHIN)  IV Stopped (12/15/22 1706)   methocarbamol (ROBAXIN) IV     PRN Meds:.acetaminophen **OR** acetaminophen, alum & mag hydroxide-simeth, diphenhydrAMINE, HYDROmorphone (DILAUDID) injection, methocarbamol **OR** methocarbamol (ROBAXIN) IV, metoCLOPramide **OR** metoCLOPramide (REGLAN) injection, ondansetron **OR** ondansetron (ZOFRAN) IV, oxyCODONE, polyethylene glycol  Current Outpatient Medications  Medication Instructions   acetaminophen (TYLENOL) 650  mg, Oral, Every 4 hours PRN   allopurinol (ZYLOPRIM) 100 mg, Oral, Daily   atorvastatin (LIPITOR) 80 mg, Oral, Daily, Restart on 06/29/22   diphenhydrAMINE (BENADRYL) 2 % cream Topical, 3 times daily PRN   furosemide (LASIX) 40 MG tablet TAKE 1 TABLET BY MOUTH ONCE DAILY ALONG WITH 20MG TABLET FOR INCREASED FLUID  RETENTION   gabapentin (NEURONTIN) 300 MG capsule 1 capsule, Oral, Daily at bedtime   hydrALAZINE (APRESOLINE) 50 mg, Oral, Every 8 hours   insulin aspart (NOVOLOG) 5 Units, Subcutaneous, 3 times daily with meals, Give only if eats 50% or more of meal.   insulin glargine-yfgn (SEMGLEE) 100 UNIT/ML Pen INJECT 15 UNITS UNDER THE SKIN ONCE DAILY   levETIRAcetam (KEPPRA) 500 mg, Oral, 2 times daily   lipase/protease/amylase (CREON) 36,000 Units, Oral, 3 times daily with meals   methocarbamol (ROBAXIN) 500 mg, Oral, Every 8 hours PRN   metoprolol succinate (TOPROL-XL) 37.5 mg, Oral, Daily   naloxone (NARCAN) nasal spray 4 mg/0.1 mL 1 spray, Nasal,  Once   naproxen sodium (ALEVE) 440 mg, Oral, Daily PRN   polyethylene glycol (MIRALAX / GLYCOLAX) 17 g, Oral, Daily   traMADol (ULTRAM) 50 mg, Oral, 2 times daily   venlafaxine XR (EFFEXOR-XR) 150 mg, Oral, Daily    Diet Orders (From admission, onward)     Start     Ordered   12/14/22 1708  Diet Carb Modified Fluid consistency: Thin; Room service appropriate? Yes  Diet effective now       Question Answer Comment  Calorie Level Medium 1600-2000   Fluid consistency: Thin   Room service appropriate? Yes      12/14/22 1707            DVT prophylaxis: enoxaparin (LOVENOX) injection 30 mg Start: 12/16/22 0800 SCDs Start: 12/14/22 1708 SCDs Start: 12/12/22 0033   Lab Results  Component Value Date   PLT 172 12/16/2022      Code Status: Full Code  Family Communication: no family at bedside   Status is: Inpatient  Remains inpatient appropriate because: severity of illness  Level of care: Med-Surg  Consultants:  Orthopedic surgery   Objective: Vitals:   12/16/22 0438 12/16/22 0748 12/16/22 0828 12/16/22 0845  BP: (!) 154/62 (!) 157/66 133/73 (!) 151/59  Pulse: 98 100 (!) 102 (!) 101  Resp: 17 18 17 18  $ Temp: 98.4 F (36.9 C) 98.7 F (37.1 C) 98.2 F (36.8 C) 98 F (36.7 C)  TempSrc: Oral Oral Oral Oral  SpO2: 99% 99%  100% 100%  Weight:      Height:        Intake/Output Summary (Last 24 hours) at 12/16/2022 1026 Last data filed at 12/16/2022 0136 Gross per 24 hour  Intake 1515.81 ml  Output 1050 ml  Net 465.81 ml    Wt Readings from Last 3 Encounters:  12/12/22 52 kg  11/12/22 51.3 kg  11/01/22 51.6 kg    Examination:  Constitutional: NAD Eyes: lids and conjunctivae normal, no scleral icterus ENMT: mmm Neck: normal, supple Respiratory: clear to auscultation bilaterally, no wheezing, no crackles. Cardiovascular: Regular rate and rhythm, no murmurs / rubs / gallops. No LE edema. Abdomen: soft, no distention, no tenderness. Bowel sounds positive.   Data Reviewed: I have independently reviewed following labs and imaging studies   CBC Recent Labs  Lab 12/11/22 1737 12/12/22 0253 12/13/22 0445 12/13/22 2010 12/15/22 0318 12/16/22 0611  WBC 9.5 10.2 11.5*  --  11.2* 8.8  HGB 7.8* 7.8* 6.6*  8.3* 8.3* 6.8*  HCT 24.9* 24.3* 19.5* 24.2* 25.0* 21.4*  PLT 255 269 181  --  219 172  MCV 97.6 94.6 92.9  --  93.3 95.1  MCH 30.6 30.4 31.4  --  31.0 30.2  MCHC 31.3 32.1 33.8  --  33.2 31.8  RDW 17.3* 17.1* 17.3*  --  17.2* 16.7*  LYMPHSABS 0.6*  --   --   --   --   --   MONOABS 0.3  --   --   --   --   --   EOSABS 0.1  --   --   --   --   --   BASOSABS 0.1  --   --   --   --   --      Recent Labs  Lab 12/11/22 1737 12/12/22 0253 12/13/22 0445 12/15/22 0318 12/16/22 0420  NA 136 136 134* 135 135  K 5.5* 5.3* 4.8 5.2* 5.1  CL 109 112* 110 110 109  CO2 16* 15* 13* 14* 14*  GLUCOSE 211* 79 125* 153* 70  BUN 57* 60* 64* 54* 49*  CREATININE 2.12* 2.30* 2.66* 2.42* 2.24*  CALCIUM 9.0 8.5* 8.3* 8.5* 8.4*  AST  --   --  17 21  --   ALT  --   --  17 10  --   ALKPHOS  --   --  105 111  --   BILITOT  --   --  0.5 0.2*  --   ALBUMIN  --   --  2.1* 2.2*  --   MG  --   --  1.2* 1.6* 1.8  INR 1.1  --   --   --   --       ------------------------------------------------------------------------------------------------------------------ No results for input(s): "CHOL", "HDL", "LDLCALC", "TRIG", "CHOLHDL", "LDLDIRECT" in the last 72 hours.  Lab Results  Component Value Date   HGBA1C >15.5 (H) 10/17/2022   ------------------------------------------------------------------------------------------------------------------ No results for input(s): "TSH", "T4TOTAL", "T3FREE", "THYROIDAB" in the last 72 hours.  Invalid input(s): "FREET3"  Cardiac Enzymes No results for input(s): "CKMB", "TROPONINI", "MYOGLOBIN" in the last 168 hours.  Invalid input(s): "CK" ------------------------------------------------------------------------------------------------------------------    Component Value Date/Time   BNP 430.0 (H) 08/19/2021 0728    CBG: Recent Labs  Lab 12/15/22 2000 12/15/22 2202 12/16/22 0130 12/16/22 0441 12/16/22 0747  GLUCAP 203* 187* 101* 72 153*     Recent Results (from the past 240 hour(s))  Urine Culture (for pregnant, neutropenic or urologic patients or patients with an indwelling urinary catheter)     Status: Abnormal   Collection Time: 12/12/22  5:29 PM   Specimen: Urine, Catheterized  Result Value Ref Range Status   Specimen Description URINE, CATHETERIZED  Final   Special Requests NONE  Final   Culture (A)  Final    >=100,000 COLONIES/mL LACTOBACILLUS SPECIES Standardized susceptibility testing for this organism is not available. Performed at [facility], [place] [address], [place], [place] [address]    Report Status 12/13/2022 FINAL  Final     Radiology Studies: DG CHEST PORT 1 VIEW  Result Date: 12/15/2022 CLINICAL DATA:  Chest pain and shortness of breath EXAM: PORTABLE CHEST 1 VIEW COMPARISON:  12/11/2022 FINDINGS: Cardiac shadow is stable. Aortic calcifications are again seen. Postsurgical changes are noted in the cervical and lumbar spine stable from the prior  exam. Old right clavicular and right humeral fractures are seen and stable. The lungs are well aerated without focal infiltrate or effusion.  IMPRESSION: No acute abnormality identified. Electronically Signed   By: Inez Catalina M.D.   On: 12/15/2022 18:26     Marzetta Board, MD, PhD [facility]  Between 7 am - 7 pm I am available, please contact me via Amion (for emergencies) or Securechat (non urgent messages)  Between 7 pm - 7 am I am not available, please contact night coverage MD/APP via Amion

## 2022-12-17 ENCOUNTER — Inpatient Hospital Stay (HOSPITAL_COMMUNITY): Payer: Medicare PPO

## 2022-12-17 ENCOUNTER — Encounter (HOSPITAL_COMMUNITY): Payer: Self-pay | Admitting: Student

## 2022-12-17 DIAGNOSIS — R079 Chest pain, unspecified: Secondary | ICD-10-CM | POA: Diagnosis not present

## 2022-12-17 DIAGNOSIS — T148XXA Other injury of unspecified body region, initial encounter: Secondary | ICD-10-CM | POA: Diagnosis not present

## 2022-12-17 LAB — ECHOCARDIOGRAM COMPLETE
AR max vel: 1.86 cm2
AV Area VTI: 1.95 cm2
AV Area mean vel: 1.8 cm2
AV Mean grad: 7 mmHg
AV Peak grad: 11.4 mmHg
Ao pk vel: 1.69 m/s
Area-P 1/2: 7.51 cm2
Height: 64 in
S' Lateral: 3 cm
Weight: 1834.23 oz

## 2022-12-17 LAB — COMPREHENSIVE METABOLIC PANEL
ALT: 8 U/L (ref 0–44)
AST: 23 U/L (ref 15–41)
Albumin: 2 g/dL — ABNORMAL LOW (ref 3.5–5.0)
Alkaline Phosphatase: 100 U/L (ref 38–126)
Anion gap: 14 (ref 5–15)
BUN: 47 mg/dL — ABNORMAL HIGH (ref 8–23)
CO2: 14 mmol/L — ABNORMAL LOW (ref 22–32)
Calcium: 9.4 mg/dL (ref 8.9–10.3)
Chloride: 111 mmol/L (ref 98–111)
Creatinine, Ser: 2.01 mg/dL — ABNORMAL HIGH (ref 0.44–1.00)
GFR, Estimated: 26 mL/min — ABNORMAL LOW (ref >60)
Glucose, Bld: 81 mg/dL (ref 70–99)
Potassium: 5.3 mmol/L — ABNORMAL HIGH (ref 3.5–5.1)
Sodium: 139 mmol/L (ref 135–145)
Total Bilirubin: 0.6 mg/dL (ref 0.3–1.2)
Total Protein: 5.1 g/dL — ABNORMAL LOW (ref 6.5–8.1)

## 2022-12-17 LAB — CBC
HCT: 25.9 % — ABNORMAL LOW (ref 36.0–46.0)
Hemoglobin: 8.4 g/dL — ABNORMAL LOW (ref 12.0–15.0)
MCH: 30.5 pg (ref 26.0–34.0)
MCHC: 32.4 g/dL (ref 30.0–36.0)
MCV: 94.2 fL (ref 80.0–100.0)
Platelets: 152 10*3/uL (ref 150–400)
RBC: 2.75 MIL/uL — ABNORMAL LOW (ref 3.87–5.11)
RDW: 16.2 % — ABNORMAL HIGH (ref 11.5–15.5)
WBC: 9.4 10*3/uL (ref 4.0–10.5)
nRBC: 0 % (ref 0.0–0.2)

## 2022-12-17 LAB — TYPE AND SCREEN
ABO/RH(D): O POS
Antibody Screen: NEGATIVE
Unit division: 0
Unit division: 0

## 2022-12-17 LAB — BPAM RBC
Blood Product Expiration Date: 202403152359
Blood Product Expiration Date: 202403222359
ISSUE DATE / TIME: 202402151003
ISSUE DATE / TIME: 202402180815
Unit Type and Rh: 5100
Unit Type and Rh: 5100

## 2022-12-17 LAB — GLUCOSE, CAPILLARY
Glucose-Capillary: 78 mg/dL (ref 70–99)
Glucose-Capillary: 141 mg/dL — ABNORMAL HIGH (ref 70–99)
Glucose-Capillary: 188 mg/dL — ABNORMAL HIGH (ref 70–99)
Glucose-Capillary: 146 mg/dL — ABNORMAL HIGH (ref 70–99)
Glucose-Capillary: 143 mg/dL — ABNORMAL HIGH (ref 70–99)
Glucose-Capillary: 138 mg/dL — ABNORMAL HIGH (ref 70–99)
Glucose-Capillary: 220 mg/dL — ABNORMAL HIGH (ref 70–99)

## 2022-12-17 LAB — MAGNESIUM: Magnesium: 1.8 mg/dL (ref 1.7–2.4)

## 2022-12-17 LAB — PREPARE RBC (CROSSMATCH)

## 2022-12-17 MED ORDER — APIXABAN 2.5 MG PO TABS: 2.5000 mg | ORAL_TABLET | Freq: Two times a day (BID) | ORAL | 0 refills | Status: DC

## 2022-12-17 MED ORDER — OXYCODONE HCL 10 MG PO TABS: 5.0000 mg | ORAL_TABLET | ORAL | 0 refills | Status: DC | PRN

## 2022-12-17 MED ORDER — METHOCARBAMOL 750 MG PO TABS: 750.0000 mg | ORAL_TABLET | Freq: Four times a day (QID) | ORAL | 0 refills | Status: DC | PRN

## 2022-12-17 MED ORDER — SODIUM ZIRCONIUM CYCLOSILICATE 10 G PO PACK
10.0000 g | PACK | Freq: Once | ORAL | Status: AC
  Administered 2022-12-17: 10 g via ORAL
  Filled 2022-12-17: qty 1

## 2022-12-17 NOTE — Progress Notes (Signed)
Physical Therapy Treatment Patient Details Name: Heather [name] MRN: [id number] DOB: September 29, 1951 Today's Date: 12/17/2022   History of Present Illness Pt is 72 yo female presenting post fall after rolling out of the bed. Upon arrival to ED found to have R distal femur fracture. Pt was admitted in mid January for R humeral neck fracture. Pt is currently post op ORIF of R supracondylar femur fracture. Pt is WB for transfers only. PMH: recurrent falls, DM II, HTN, CHF, CKD IV, Anemia.    PT Comments    Pt is presenting below her baseline level of function. Prior to hospitalization pt was a Min A for most activities and her spouse was able to assist with functional transfers and ADL's. Currently pt is Mod A for sit to stand and unable to progress steps for transfers/gait due to weakness/pain in the RLE. Due to pt current functional status, home set up and available assistance at home recommending skilled physical therapy services in SNF setting on discharge from acute care hospital in order to decrease risk for falls, injury, immobility and re-hospitalization. Pt demonstrates no signs/symptoms of cardiac/respiratory distress throughout session.        Recommendations for follow up therapy are one component of a multi-disciplinary discharge planning process, led by the attending physician.  Recommendations may be updated based on patient status, additional functional criteria and insurance authorization.  Follow Up Recommendations  Skilled nursing-short term rehab (<3 hours/day) Can patient physically be transported by private vehicle: No   Assistance Recommended at Discharge Frequent or constant Supervision/Assistance  Patient can return home with the following Two people to help with walking and/or transfers;Help with stairs or ramp for entrance;Assist for transportation;Assistance with cooking/housework   Equipment Recommendations  Other (comment) (defer to post acute)    Recommendations  for Other Services       Precautions / Restrictions Precautions Precautions: Fall Restrictions Weight Bearing Restrictions: Yes RLE Weight Bearing: Weight bearing as tolerated Other Position/Activity Restrictions: transfer only     Mobility  Bed Mobility Overal bed mobility: Needs Assistance Bed Mobility: Supine to Sit, Sit to Supine     Supine to sit: Min assist Sit to supine: Mod assist   General bed mobility comments: Pt was Min A to scoot toward EOB and CGA at trunk to bring trunk up to mid-line for supine to sitting and Mod A at the bil LE for sitting to supine due to weakness and pain in the RLE. Patient Response: Cooperative, Flat affect  Transfers Overall transfer level: Needs assistance Equipment used: Rolling walker (2 wheels) Transfers: Sit to/from Stand Sit to Stand: Mod assist, From elevated surface           General transfer comment: Elevated EOB. Pt was able to perform sit to stand at Mod A 2x with assistance for momentum to get to standing from sitting. Improve midline alignment this session. Wgt shifted to the L due to pain weakness on the R.    Ambulation/Gait               General Gait Details: Pt attempted to take side steps at EOB and was unable to clear the RLE to progress. Pt is WBAT for transfers only at this time.        Balance Overall balance assessment: Needs assistance Sitting-balance support: Bilateral upper extremity supported Sitting balance-Leahy Scale: Fair Sitting balance - Comments: Pt had no overt LOB this session sitting EOB.   Standing balance support: Bilateral upper extremity supported, Reliant on  assistive device for balance Standing balance-Leahy Scale: Poor Standing balance comment: Pt requires Mod A to remain standing EOB with heavy wgt shift to the L.        Cognition Arousal/Alertness: Awake/alert Behavior During Therapy: WFL for tasks assessed/performed Overall Cognitive Status: Impaired/Different from  baseline Area of Impairment: Following commands, Safety/judgement, Awareness     Following Commands: Follows one step commands consistently, Follows one step commands with increased time Safety/Judgement: Decreased awareness of safety, Decreased awareness of deficits     General Comments: Pt did not know the day, month, year or hospital that she was at. She could state that she was in a hospital. Pt was able to understand that she is confused. Initially refusing physical therapy but agreeable after pt educated that moving around can help improve memory/functionl following surgery.           General Comments General comments (skin integrity, edema, etc.): Pt initially was very confused. After sitting/standing some of the confusion seemed to improve but continued with short term memory issues asking the same questions multiple times that had been addressed.      Pertinent Vitals/Pain Pain Assessment Pain Assessment: Faces Faces Pain Scale: Hurts even more Pain Location: RLE Pain Descriptors / Indicators: Aching, Operative site guarding Pain Intervention(s): Monitored during session     PT Goals (current goals can now be found in the care plan section) Acute Rehab PT Goals Patient Stated Goal: Pt wants to decrease pain PT Goal Formulation: With patient Time For Goal Achievement: 12/29/22 Potential to Achieve Goals: Fair Additional Goals Additional Goal #1: Will wheel herself 150 ft in W/C managing brakes, arm rests and leg rests in order to increase independent mobility Progress towards PT goals: Progressing toward goals    Frequency    Min 3X/week      PT Plan Current plan remains appropriate       AM-PAC PT "6 Clicks" Mobility   Outcome Measure  Help needed turning from your back to your side while in a flat bed without using bedrails?: A Little Help needed moving from lying on your back to sitting on the side of a flat bed without using bedrails?: A Little Help  needed moving to and from a bed to a chair (including a wheelchair)?: A Lot Help needed standing up from a chair using your arms (e.g., wheelchair or bedside chair)?: A Lot Help needed to walk in hospital room?: Total Help needed climbing 3-5 steps with a railing? : Total 6 Click Score: 12    End of Session Equipment Utilized During Treatment: Gait belt Activity Tolerance: Patient limited by fatigue;Patient tolerated treatment well Patient left: in bed;with call bell/phone within reach;with bed alarm set Nurse Communication: Mobility status PT Visit Diagnosis: Unsteadiness on feet (R26.81);Other abnormalities of gait and mobility (R26.89);Muscle weakness (generalized) (M62.81);History of falling (Z91.81)     Time: [id number] PT Time Calculation (min) (ACUTE ONLY): 25 min  Charges:  $Therapeutic Activity: 23-37 mins                     Tomma Rakers, DPT, CLT  Acute Rehabilitation Services Office: [phone] (Secure chat preferred)    Ander Purpura 12/17/2022, 3:12 PM

## 2022-12-17 NOTE — NC FL2 (Signed)
[place] LEVEL OF CARE FORM     IDENTIFICATION  Patient Name: Heather [name] Birthdate: 1951/02/23 Sex: female Admission Date (Current Location): 12/11/2022  [place] and [place] Number:  Heather [name] [id number] [place] and Address:  The [place], [place] [address], [place], [place] [address]      Provider Number: [id number]  Attending Physician Name and Address:  Caren Griffins, MD  Relative Name and Phone Number:  [name], Rotan Spouse [phone]    Current Level of Care: Hospital Recommended Level of Care: [place] Prior Approval Number:    Date Approved/Denied:   PASRR Number: [id number]  Discharge Plan: SNF    Current Diagnoses: Patient Active Problem List   Diagnosis Date Noted   Fracture 12/11/2022   Pseudohyponatremia 11/12/2022   Thrombocytosis 11/12/2022   DKA (diabetic ketoacidosis) ([place]) 11/12/2022   Uncontrolled type 2 diabetes mellitus with hyperglycemia ([place]) 11/11/2022   AKI (acute kidney injury) ([place]) 10/17/2022   DKA, type 2 ([place]) 10/16/2022   History of right shoulder fracture 10/15/2022   Aortic atherosclerosis ([place]) 08/01/2022   Recurrent falls 07/04/2022   Humeral head fracture 07/04/2022   Right leg weakness 06/20/2022   Anxiety 06/20/2022   UTI (urinary tract infection) 06/20/2022   Malnutrition of moderate degree 06/20/2022   Hyperkalemia 06/19/2022   Type 2 diabetes mellitus with hyperosmolar nonketotic hyperglycemia ([place]) 05/27/2022   Acute kidney failure ([place]) 03/17/2022   Mixed diabetic hyperlipidemia associated with type 2 diabetes mellitus ([place]) 03/16/2022   Chronic diastolic CHF (congestive heart failure) ([place]) 03/16/2022   Decubitus ulcer of left buttock, stage 2 ([place]) 03/16/2022   Cellulitis of left hand 11/30/2021   Closed fracture of part of upper end of humerus 11/30/2021   At risk for falls 11/28/2021   Neoplasm of meninges 11/28/2021   Sepsis ([place]) 11/28/2021    Acute on chronic anemia 11/28/2021   Cerebral thrombosis with cerebral infarction 08/28/2021   Cerebrovascular accident (CVA) due to embolism of cerebral artery ([place])    Pressure injury of skin 08/26/2021   Acute respiratory failure with hypoxia ([place]) [id number]   Acute metabolic encephalopathy [id number]   Malignant hypertension 08/25/2021   Thrombocytopenia ([place]) 08/25/2021   Anemia due to chronic kidney disease 08/25/2021   Blurry vision, bilateral 08/16/2021   Hyponatremia 04/01/2021   COVID-19 03/30/2021   Senile osteoporosis 02/10/2021   Macrocytic anemia    E coli enteritis 01/09/2021   Acute pancreatitis 01/08/2021   Acute renal failure superimposed on stage 4 chronic kidney disease ([place]) 01/07/2021   Diarrhea    Acute hypokalemia 01/06/2021   Long-term use of immunosuppressant medication-anticipated 11/28/2020   Elevated blood-pressure reading, without diagnosis of hypertension 05/30/2020   Meningioma ([place]) 05/30/2020   Headache 05/17/2020   Hypokalemia 05/17/2020   Hypomagnesemia 05/17/2020   Dehydration 05/17/2020   Hypertensive urgency 05/17/2020   Visual disturbance 05/17/2020   Left hip pain 09/22/2019   Lumbar radiculopathy 09/22/2019   Postoperative anemia due to acute blood loss 09/18/2019   Postoperative urinary retention 09/18/2019   Closed fracture of distal end of radius 05/26/2019   Coronary arteriosclerosis 07/15/2018   Closed right hip fracture ([place]) 04/08/2018   Elevated lipase    Crohn's disease of small intestine without complication (HCC)    Protein-calorie malnutrition, severe 03/05/2018   Femur fracture, right ([place]) 03/30/2017   Neck pain 03/12/2017   Acute kidney injury superimposed on CKD ([place]) 01/25/2016   Neuropathy 03/02/2015   Pain in the chest  Chest pain 12/22/2014   Hypertension 12/22/2014   Nausea & vomiting 12/22/2014   Uncontrolled type 2 diabetes mellitus with hyperglycemia, with long-term current use of insulin ([place])  12/22/2014   Hiatal hernia 12/22/2014   GERD (gastroesophageal reflux disease) 12/22/2014   Gout 12/22/2014   Mixed hyperlipidemia 12/22/2014   Depression 12/22/2014   Type 2 diabetes mellitus without complications ([place]) [id number]   Fusion of spine, cervicothoracic region 11/01/2014   Pseudophakia, left eye 10/20/2014   Chronic kidney disease (CKD), stage IV (severe) (HCC) 08/25/2014   Cervical stenosis of spine 08/26/2013   Vitamin B12 deficiency 07/08/2011   Personal history of failed moderate sedation 04/16/2011   Chronic Nausea and Vomiting - ? gastroapresis 04/16/2011   Early satiety 04/16/2011   Irritable bowel syndrome 04/16/2011   Benign paroxysmal positional vertigo 11/20/2010   TRICUSPID REGURGITATION, MODERATE 11/20/2010   PULMONARY HYPERTENSION, MILD 11/20/2010   LEG EDEMA, BILATERAL 11/20/2010   DYSPNEA 11/20/2010   NONSPECIFIC ABNORMAL ELECTROCARDIOGRAM 11/20/2010   CHEST WALL PAIN, HX OF 11/20/2010    Orientation RESPIRATION BLADDER Height & Weight     Self, Time, Situation, Place  Normal Continent, Indwelling catheter Weight: 114 lb 10.2 oz (52 kg) Height:  5' 4"$  (162.6 cm)  BEHAVIORAL SYMPTOMS/MOOD NEUROLOGICAL BOWEL NUTRITION STATUS      Incontinent Diet (see discharge summary)  AMBULATORY STATUS COMMUNICATION OF NEEDS Skin   Total Care Verbally Surgical wounds                       Personal Care Assistance Level of Assistance  Bathing, Feeding, Dressing Bathing Assistance: Maximum assistance Feeding assistance: Limited assistance Dressing Assistance: Maximum assistance     Functional Limitations Info  Sight, Hearing, Speech Sight Info: Adequate Hearing Info: Adequate Speech Info: Adequate    SPECIAL CARE FACTORS FREQUENCY  PT (By licensed PT), OT (By licensed OT)     PT Frequency: 5x week OT Frequency: 5x week            Contractures Contractures Info: Not present    Additional Factors Info  Code Status, Allergies, Insulin  Sliding Scale Code Status Info: full Allergies Info: Cozaar (Losartan), Quinolones, Cymbalta (Duloxetine Hcl), Sulfa Antibiotics, Synalar (Fluocinolone), Cipro (Ciprofloxacin Hcl), Diovan (Valsartan), Glucophage (Metformin), Ketalar (Ketamine), Norco (Hydrocodone-acetaminophen), Norvasc (Amlodipine Besylate), Nsaids, Tape, Zestril (Lisinopril)   Insulin Sliding Scale Info: Novolog: see discharge summary       Current Medications (12/17/2022):  This is the current hospital active medication list Current Facility-Administered Medications  Medication Dose Route Frequency Provider Last Rate Last Admin   0.9 %  sodium chloride infusion   Intravenous Continuous Corinne Ports, PA-C 50 mL/hr at 12/16/22 0135 Infusion Verify at 12/16/22 0135   acetaminophen (TYLENOL) tablet 1,000 mg  1,000 mg Oral Q8H McBane, Caroline N, PA-C   1,000 mg at 12/17/22 0542   allopurinol (ZYLOPRIM) tablet 100 mg  100 mg Oral Daily Rushie Nyhan A, PA-C   100 mg at 12/17/22 0823   alum & mag hydroxide-simeth (MAALOX/MYLANTA) 200-200-20 MG/5ML suspension 15 mL  15 mL Oral Q4H PRN Caren Griffins, MD   15 mL at 12/17/22 0643   atorvastatin (LIPITOR) tablet 80 mg  80 mg Oral Daily Corinne Ports, PA-C   80 mg at 12/17/22 [id number]   Chlorhexidine Gluconate Cloth 2 % PADS 6 each  6 each Topical Daily Corinne Ports, PA-C   6 each at 12/15/22 1014   diphenhydrAMINE (BENADRYL) 12.5 MG/5ML elixir 12.5-25 mg  12.5-25 mg Oral Q4H PRN Corinne Ports, PA-C       docusate sodium (COLACE) capsule 100 mg  100 mg Oral BID Corinne Ports, PA-C   100 mg at 12/17/22 [id number]   enoxaparin (LOVENOX) injection 30 mg  30 mg Subcutaneous Q24H Caren Griffins, MD   30 mg at 12/17/22 [id number]   gabapentin (NEURONTIN) capsule 300 mg  300 mg Oral QHS Corinne Ports, PA-C   300 mg at 12/16/22 2133   hydrALAZINE (APRESOLINE) tablet 50 mg  50 mg Oral Q8H Corinne Ports, PA-C   50 mg at 12/17/22 0542   HYDROmorphone (DILAUDID) injection 0.5 mg  0.5  mg Intravenous Q4H PRN Ethelda Chick, PA-C   0.5 mg at 12/16/22 2129   insulin aspart (novoLOG) injection 0-15 Units  0-15 Units Subcutaneous Q4H Corinne Ports, PA-C   2 Units at 12/17/22 [id number]   levETIRAcetam (KEPPRA) tablet 500 mg  500 mg Oral BID Corinne Ports, PA-C   500 mg at 12/17/22 [id number]   lipase/protease/amylase (CREON) capsule 36,000 Units  36,000 Units Oral TID WC Corinne Ports, PA-C   36,000 Units at 12/17/22 [id number]   methocarbamol (ROBAXIN) tablet 750 mg  750 mg Oral Q6H PRN Ethelda Chick, PA-C   750 mg at 12/17/22 [id number]   Or   methocarbamol (ROBAXIN) 500 mg in dextrose 5 % 50 mL IVPB  500 mg Intravenous Q6H PRN McBane, Maylene Roes, PA-C       metoCLOPramide (REGLAN) tablet 5-10 mg  5-10 mg Oral Q8H PRN Thereasa Solo, Sarah A, PA-C       Or   metoCLOPramide (REGLAN) injection 5-10 mg  5-10 mg Intravenous Q8H PRN Corinne Ports, PA-C       metoprolol succinate (TOPROL-XL) 24 hr tablet 37.5 mg  37.5 mg Oral Daily Rushie Nyhan A, PA-C   37.5 mg at 12/17/22 [id number]   ondansetron (ZOFRAN) tablet 4 mg  4 mg Oral Q6H PRN Corinne Ports, PA-C       Or   ondansetron (ZOFRAN) injection 4 mg  4 mg Intravenous Q6H PRN Thereasa Solo, Sarah A, PA-C       oxyCODONE (Oxy IR/ROXICODONE) immediate release tablet 10 mg  10 mg Oral Q4H PRN Ethelda Chick, PA-C   10 mg at 12/17/22 1006   oxyCODONE (Oxy IR/ROXICODONE) immediate release tablet 5 mg  5 mg Oral Q4H PRN Ethelda Chick, PA-C   5 mg at 12/17/22 0553   polyethylene glycol (MIRALAX / GLYCOLAX) packet 17 g  17 g Oral Daily PRN Corinne Ports, PA-C       venlafaxine XR (EFFEXOR-XR) 24 hr capsule 150 mg  150 mg Oral Daily Corinne Ports, PA-C   150 mg at 12/17/22 [id number]     Discharge Medications: Please see discharge summary for a list of discharge medications.  Relevant Imaging Results:  Relevant Lab Results:   Additional Information SS# [id number]  Joanne Chars, LCSW

## 2022-12-17 NOTE — Progress Notes (Signed)
PROGRESS NOTE  Heather [name] [id number] DOB: [birth year] DOA: 12/11/2022 PCP: [name], MD   LOS: 6 days   Brief Narrative / Interim history: 72 year old female with DM2, HTN, CKD 4, chronic diastolic CHF comes into the hospital with a fall.  She has a history of frequent falls, she is supposed to use a walker ambulation, and on the day of admission she rolled out of bed and fell onto the floor.  She was unable to get up.  In the ED she was found to have right distal femur fracture.  She was admitted to the hospital and orthopedic surgery consulted.  Of note, she had a recent hospitalization in mid January for HHS with a right humeral neck fracture  Subjective / 24h Interval events: No further chest discomfort  Assesement and Plan: Principal Problem:   Fracture Active Problems:   Recurrent falls   Uncontrolled type 2 diabetes mellitus with hyperglycemia, with long-term current use of insulin (HCC)   Hypertension   Chronic diastolic CHF (congestive heart failure) (HCC)   Chronic kidney disease (CKD), stage IV (severe) (HCC)   Anemia due to chronic kidney disease  Principal problem Right distal femur fracture -orthopedic surgery consult, appreciate input.  She is status post ORIF of the right supracondylar distal femur fracture and removal of hardware of the right femur by Dr. Doreatha Martin on 12/14/2022.  Monitor postoperatively, PT recommends SNF  Active problems Essential hypertension-continue home metoprolol, hydralazine, blood pressure acceptable  Troponin elevation - likely due to demand ischemia in the setting of surgical stress. She did have a minute long episode of chest pain on 2/17 worse with deep breathing, very atypical for ACS. For completeness will obtain a 2D echo  Chronic diastolic CHF -she appears euvolemic today, last echo October 2022, EF 60-65%. 2D echo update today   CKD stage IV -variable baseline, 2.5-3.5.  At baseline  UTI - UA suggestive on infection  and she is symptomatic with pain / burning with urination.  Completed 5 days of ceftriaxone. Cultures showing lactobacillus.   Hypomagnesemia-magnesium is able this morning  Hyperkalemia-mild, due to chronic kidney disease.  Lokelma today  Anemia of chronic renal disease, acute blood loss anemia - s/p 2 U of PRBC total. Hb now stable   DM2, uncontrolled, with hyperglycemia -A1c over 15.  CBGs controlled while here  CBG (last 3)  Recent Labs    12/16/22 2306 12/17/22 0416 12/17/22 0808  GLUCAP 276* 78 141*    Scheduled Meds:  acetaminophen  1,000 mg Oral Q8H   allopurinol  100 mg Oral Daily   atorvastatin  80 mg Oral Daily   Chlorhexidine Gluconate Cloth  6 each Topical Daily   docusate sodium  100 mg Oral BID   enoxaparin (LOVENOX) injection  30 mg Subcutaneous Q24H   gabapentin  300 mg Oral QHS   hydrALAZINE  50 mg Oral Q8H   insulin aspart  0-15 Units Subcutaneous Q4H   levETIRAcetam  500 mg Oral BID   lipase/protease/amylase  36,000 Units Oral TID WC   metoprolol succinate  37.5 mg Oral Daily   venlafaxine XR  150 mg Oral Daily   Continuous Infusions:  sodium chloride 50 mL/hr at 12/16/22 0135   methocarbamol (ROBAXIN) IV     PRN Meds:.alum & mag hydroxide-simeth, diphenhydrAMINE, HYDROmorphone (DILAUDID) injection, methocarbamol **OR** methocarbamol (ROBAXIN) IV, metoCLOPramide **OR** metoCLOPramide (REGLAN) injection, ondansetron **OR** ondansetron (ZOFRAN) IV, oxyCODONE, oxyCODONE, polyethylene glycol  Current Outpatient Medications  Medication Instructions   acetaminophen (TYLENOL) 650  mg, Oral, Every 4 hours PRN   allopurinol (ZYLOPRIM) 100 mg, Oral, Daily   atorvastatin (LIPITOR) 80 mg, Oral, Daily, Restart on 06/29/22   diphenhydrAMINE (BENADRYL) 2 % cream Topical, 3 times daily PRN   furosemide (LASIX) 40 MG tablet TAKE 1 TABLET BY MOUTH ONCE DAILY ALONG WITH 20MG TABLET FOR INCREASED FLUID RETENTION   gabapentin (NEURONTIN) 300 MG capsule 1 capsule, Oral,  Daily at bedtime   hydrALAZINE (APRESOLINE) 50 mg, Oral, Every 8 hours   insulin aspart (NOVOLOG) 5 Units, Subcutaneous, 3 times daily with meals, Give only if eats 50% or more of meal.   insulin glargine-yfgn (SEMGLEE) 100 UNIT/ML Pen INJECT 15 UNITS UNDER THE SKIN ONCE DAILY   levETIRAcetam (KEPPRA) 500 mg, Oral, 2 times daily   lipase/protease/amylase (CREON) 36,000 Units, Oral, 3 times daily with meals   methocarbamol (ROBAXIN) 500 mg, Oral, Every 8 hours PRN   metoprolol succinate (TOPROL-XL) 37.5 mg, Oral, Daily   naloxone (NARCAN) nasal spray 4 mg/0.1 mL 1 spray, Nasal,  Once   naproxen sodium (ALEVE) 440 mg, Oral, Daily PRN   polyethylene glycol (MIRALAX / GLYCOLAX) 17 g, Oral, Daily   traMADol (ULTRAM) 50 mg, Oral, 2 times daily   venlafaxine XR (EFFEXOR-XR) 150 mg, Oral, Daily    Diet Orders (From admission, onward)     Start     Ordered   12/14/22 1708  Diet Carb Modified Fluid consistency: Thin; Room service appropriate? Yes  Diet effective now       Question Answer Comment  Calorie Level Medium 1600-2000   Fluid consistency: Thin   Room service appropriate? Yes      12/14/22 1707            DVT prophylaxis: enoxaparin (LOVENOX) injection 30 mg Start: 12/16/22 0800 SCDs Start: 12/14/22 1708 SCDs Start: 12/12/22 0033   Lab Results  Component Value Date   PLT 152 12/17/2022      Code Status: Full Code  Family Communication: no family at bedside   Status is: Inpatient  Remains inpatient appropriate because: SNF placement pending  Level of care: Med-Surg  Consultants:  Orthopedic surgery   Objective: Vitals:   12/16/22 1529 12/16/22 2004 12/17/22 0546 12/17/22 0756  BP: (!) 167/65 (!) 158/56 (!) 156/54 (!) 173/74  Pulse: 99  87 98  Resp: 18 18  17  $ Temp: 98 F (36.7 C) 98.2 F (36.8 C)  98.1 F (36.7 C)  TempSrc: Oral Oral  Oral  SpO2: 100% 98%  100%  Weight:      Height:        Intake/Output Summary (Last 24 hours) at 12/17/2022  1131 Last data filed at 12/17/2022 1011 Gross per 24 hour  Intake 475 ml  Output 600 ml  Net -125 ml    Wt Readings from Last 3 Encounters:  12/12/22 52 kg  11/12/22 51.3 kg  11/01/22 51.6 kg    Examination:  Constitutional: NAD Eyes: lids and conjunctivae normal, no scleral icterus ENMT: mmm Neck: normal, supple Respiratory: clear to auscultation bilaterally, no wheezing, no crackles. Normal respiratory effort.  Cardiovascular: Regular rate and rhythm, no murmurs / rubs / gallops. No LE edema. Abdomen: soft, no distention, no tenderness. Bowel sounds positive.  Skin: no rashes  Data Reviewed: I have independently reviewed following labs and imaging studies   CBC Recent Labs  Lab 12/11/22 1737 12/12/22 0253 12/13/22 0445 12/13/22 2010 12/15/22 0318 12/16/22 0611 12/16/22 1808 12/17/22 0509  WBC 9.5 10.2 11.5*  --  11.2* 8.8  --  9.4  HGB 7.8* 7.8* 6.6* 8.3* 8.3* 6.8* 8.2* 8.4*  HCT 24.9* 24.3* 19.5* 24.2* 25.0* 21.4* 24.5* 25.9*  PLT 255 269 181  --  219 172  --  152  MCV 97.6 94.6 92.9  --  93.3 95.1  --  94.2  MCH 30.6 30.4 31.4  --  31.0 30.2  --  30.5  MCHC 31.3 32.1 33.8  --  33.2 31.8  --  32.4  RDW 17.3* 17.1* 17.3*  --  17.2* 16.7*  --  16.2*  LYMPHSABS 0.6*  --   --   --   --   --   --   --   MONOABS 0.3  --   --   --   --   --   --   --   EOSABS 0.1  --   --   --   --   --   --   --   BASOSABS 0.1  --   --   --   --   --   --   --      Recent Labs  Lab 12/11/22 1737 12/12/22 0253 12/13/22 0445 12/15/22 0318 12/16/22 0420 12/17/22 0509  NA 136 136 134* 135 135 139  K 5.5* 5.3* 4.8 5.2* 5.1 5.3*  CL 109 112* 110 110 109 111  CO2 16* 15* 13* 14* 14* 14*  GLUCOSE 211* 79 125* 153* 70 81  BUN 57* 60* 64* 54* 49* 47*  CREATININE 2.12* 2.30* 2.66* 2.42* 2.24* 2.01*  CALCIUM 9.0 8.5* 8.3* 8.5* 8.4* 9.4  AST  --   --  17 21  --  23  ALT  --   --  17 10  --  8  ALKPHOS  --   --  105 111  --  100  BILITOT  --   --  0.5 0.2*  --  0.6  ALBUMIN  --    --  2.1* 2.2*  --  2.0*  MG  --   --  1.2* 1.6* 1.8 1.8  INR 1.1  --   --   --   --   --      ------------------------------------------------------------------------------------------------------------------ No results for input(s): "CHOL", "HDL", "LDLCALC", "TRIG", "CHOLHDL", "LDLDIRECT" in the last 72 hours.  Lab Results  Component Value Date   HGBA1C >15.5 (H) 10/17/2022   ------------------------------------------------------------------------------------------------------------------ No results for input(s): "TSH", "T4TOTAL", "T3FREE", "THYROIDAB" in the last 72 hours.  Invalid input(s): "FREET3"  Cardiac Enzymes No results for input(s): "CKMB", "TROPONINI", "MYOGLOBIN" in the last 168 hours.  Invalid input(s): "CK" ------------------------------------------------------------------------------------------------------------------    Component Value Date/Time   BNP 430.0 (H) 08/19/2021 0728    CBG: Recent Labs  Lab 12/16/22 1821 12/16/22 1954 12/16/22 2306 12/17/22 0416 12/17/22 0808  GLUCAP 139* 188* 276* 78 141*     Recent Results (from the past 240 hour(s))  Urine Culture (for pregnant, neutropenic or urologic patients or patients with an indwelling urinary catheter)     Status: Abnormal   Collection Time: 12/12/22  5:29 PM   Specimen: Urine, Catheterized  Result Value Ref Range Status   Specimen Description URINE, CATHETERIZED  Final   Special Requests NONE  Final   Culture (A)  Final    >=100,000 COLONIES/mL LACTOBACILLUS SPECIES Standardized susceptibility testing for this organism is not available. Performed at [facility], [place] [address] [place], [place] [address]    Report Status 12/13/2022 FINAL  Final  Radiology Studies: No results found.   Marzetta Board, MD, PhD [facility]  Between 7 am - 7 pm I am available, please contact me via Amion (for emergencies) or Securechat (non urgent messages)  Between 7 pm - 7 am I am  not available, please contact night coverage MD/APP via Amion

## 2022-12-17 NOTE — Progress Notes (Cosign Needed Addendum)
ORTHOPAEDIC PROGRESS NOTE  s/p Procedure(s): OPEN REDUCTION INTERNAL FIXATION (ORIF) DISTAL FEMUR FRACTURE on 12/14/22 with Dr. Doreatha Martin  SUBJECTIVE: Patient sleeping comfortably in bed this afternoon.  Patient has been agreeable to SNF, would like [facility].  OBJECTIVE: PE: General: sitting up in hospital bed, NAD RLE: dressings CDI, intact EHL/TA/GSC, leg lengths equal, warm well perfused foot   Vitals:   12/17/22 0756 12/17/22 1343  BP: (!) 173/74 (!) 169/74  Pulse: 98 88  Resp: 17 17  Temp: 98.1 F (36.7 C) 97.9 F (36.6 C)  SpO2: 100% 100%   CV/Blood loss: Acute blood loss anemia, Hgb 8.4 this morning. Received pRBCs 12/16/22, responded well.   ASSESSMENT: TALISE SANDERS is a 72 y.o. female POD#3  PLAN: Weightbearing: WBAT RLE for transfers only Insicional and dressing care: Changed PRN. Ok to leave incisions open to air if no drainage Orthopedic device(s): None Showering: Okay to begin showering getting incisions wet VTE prophylaxis: Lovenox 30 mg QD.  Plan to transition to Eliquis at discharge Pain control: PRN pain medications, minimize narcotics as able - Scheduled Tylenol 1000 mevery every 8 hours - Oxycodone 5-10 mg every 4 hours as needed  - Dilaudid 0.5 mg every 4 hours as needed for severe pain - Robaxin 750 mg every 8 hours as needed for muscle spasms  Dispo: PT/OT recommending SNF, TOC following.  Patient stable for discharge from ortho standpoint once cleared by medicine team and therapies.  I have signed and placed discharge Rx for pain medication, Eliquis, muscle relaxer in patient's chart  Follow - up plan: 2 weeks after discharge for wound check and repeat x-rays   Contact information: After hours and holidays please check [email or web address] for group call information for [facility]   Noemi Chapel, PA-C 12/16/22

## 2022-12-17 NOTE — Discharge Instructions (Signed)
[facility] Discharge Instructions   General Discharge Instructions  WEIGHT BEARING STATUS:weightbearing for transfers only right lower extremity  RANGE OF MOTION/ACTIVITY:ok for knee range of motion as tolerated  Wound Care: You may remove your surgical dressing. Incisions can be left open to air if there is no drainage. Once the incision is completely dry and without drainage, it may be left open to air out.  Showering may begin Tuesday 12/18/22.  Clean incision gently with soap and water.  DVT/PE prophylaxis:  Eliquis 2.5 mg twice daily x 30 days  Diet: as you were eating previously.  Can use over the counter stool softeners and bowel preparations, such as Miralax, to help with bowel movements.  Narcotics can be constipating.  Be sure to drink plenty of fluids  PAIN MEDICATION USE AND EXPECTATIONS  You have likely been given narcotic medications to help control your pain.  After a traumatic event that results in an fracture (broken bone) with or without surgery, it is ok to use narcotic pain medications to help control one's pain.  We understand that everyone responds to pain differently and each individual patient will be evaluated on a regular basis for the continued need for narcotic medications. Ideally, narcotic medication use should last no more than 6-8 weeks (coinciding with fracture healing).   As a patient it is your responsibility as well to monitor narcotic medication use and report the amount and frequency you use these medications when you come to your office visit.   We would also advise that if you are using narcotic medications, you should take a dose prior to therapy to maximize you participation.  IF YOU ARE ON NARCOTIC MEDICATIONS IT IS NOT PERMISSIBLE TO OPERATE A MOTOR VEHICLE (MOTORCYCLE/CAR/TRUCK/MOPED) OR HEAVY MACHINERY DO NOT MIX NARCOTICS WITH OTHER CNS (CENTRAL NERVOUS SYSTEM) DEPRESSANTS SUCH AS ALCOHOL   STOP SMOKING OR USING NICOTINE  PRODUCTS!!!!  As discussed nicotine severely impairs your body's ability to heal surgical and traumatic wounds but also impairs bone healing.  Wounds and bone heal by forming microscopic blood vessels (angiogenesis) and nicotine is a vasoconstrictor (essentially, shrinks blood vessels).  Therefore, if vasoconstriction occurs to these microscopic blood vessels they essentially disappear and are unable to deliver necessary nutrients to the healing tissue.  This is one modifiable factor that you can do to dramatically increase your chances of healing your injury.    (This means no smoking, no nicotine gum, patches, etc)  DO NOT USE NONSTEROIDAL ANTI-INFLAMMATORY DRUGS (NSAID'S)  Using products such as Advil (ibuprofen), Aleve (naproxen), Motrin (ibuprofen) for additional pain control during fracture healing can delay and/or prevent the healing response.  If you would like to take over the counter (OTC) medication, Tylenol (acetaminophen) is ok.  However, some narcotic medications that are given for pain control contain acetaminophen as well. Therefore, you should not exceed more than 4000 mg of tylenol in a day if you do not have liver disease.  Also note that there are may OTC medicines, such as cold medicines and allergy medicines that my contain tylenol as well.  If you have any questions about medications and/or interactions please ask your doctor/PA or your pharmacist.      ICE AND ELEVATE INJURED/OPERATIVE EXTREMITY  Using ice and elevating the injured extremity above your heart can help with swelling and pain control.  Icing in a pulsatile fashion, such as 20 minutes on and 20 minutes off, can be followed.    Do not place ice directly on  skin. Make sure there is a barrier between to skin and the ice pack.    Using frozen items such as frozen peas works well as the conform nicely to the are that needs to be iced.  USE AN ACE WRAP OR TED HOSE FOR SWELLING CONTROL  In addition to icing and elevation,  Ace wraps or TED hose are used to help limit and resolve swelling.  It is recommended to use Ace wraps or TED hose until you are informed to stop.    When using Ace Wraps start the wrapping distally (farthest away from the body) and wrap proximally (closer to the body)   Example: If you had surgery on your leg or thing and you do not have a splint on, start the ace wrap at the toes and work your way up to the thigh        If you had surgery on your upper extremity and do not have a splint on, start the ace wrap at your fingers and work your way up to the upper arm   [place]: [phone]   VISIT OUR WEBSITE FOR ADDITIONAL INFORMATION: [email or web address]     Discharge Wound Care Instructions  Do NOT apply any ointments, solutions or lotions to pin sites or surgical wounds.  These prevent needed drainage and even though solutions like hydrogen peroxide kill bacteria, they also damage cells lining the pin sites that help fight infection.  Applying lotions or ointments can keep the wounds moist and can cause them to breakdown and open up as well. This can increase the risk for infection. When in doubt call the office.  If any drainage is noted, use one layer of adaptic or Mepitel, then gauze, Kerlix, and an ace wrap. - These dressing supplies should be available at local medical supply stores [facility], [facility], etc) as well as Management consultant ([facility], [facility], [place], etc)  Once the incision is completely dry and without drainage, it may be left open to air out.  Showering may begin 36-48 hours later.  Cleaning gently with soap and water.  Traumatic wounds should be dressed daily as well.    One layer of adaptic, gauze, Kerlix, then ace wrap.  The adaptic can be discontinued once the draining has ceased    If you have a wet to dry dressing: wet the gauze with saline the squeeze as much saline out so the gauze is moist (not soaking wet), place  moistened gauze over wound, then place a dry gauze over the moist one, followed by Kerlix wrap, then ace wrap.

## 2022-12-17 NOTE — Anesthesia Postprocedure Evaluation (Signed)
Anesthesia Post Note  Patient: Heather [name]  Procedure(s) Performed: OPEN REDUCTION INTERNAL FIXATION (ORIF) DISTAL FEMUR FRACTURE (Right: Leg Upper)     Patient location during evaluation: PACU Anesthesia Type: General Level of consciousness: awake and alert Pain management: pain level controlled Vital Signs Assessment: post-procedure vital signs reviewed and stable Respiratory status: spontaneous breathing, nonlabored ventilation, respiratory function stable and patient connected to nasal cannula oxygen Cardiovascular status: blood pressure returned to baseline and stable Postop Assessment: no apparent nausea or vomiting Anesthetic complications: no  No notable events documented.  Last Vitals:  Vitals:   12/17/22 0756 12/17/22 1343  BP: (!) 173/74 (!) 169/74  Pulse: 98 88  Resp: 17 17  Temp: 36.7 C 36.6 C  SpO2: 100% 100%    Last Pain:  Vitals:   12/17/22 1343  TempSrc: Oral  PainSc:    Pain Goal:                   Majour Frei L Geniya Fulgham

## 2022-12-17 NOTE — TOC Progression Note (Addendum)
Transition of Care [facility]) - Progression Note    Patient Details  Name: Heather [name] MRN: [id number] Date of Birth: April 12, 1951  Transition of Care [facility]) CM/SW Contact  Joanne Chars, [place] Phone Number: 12/17/2022, 11:11 AM  Clinical Narrative:   CSW spoke with pt regarding DC recommendation for SNF--she was at [facility] recently--does not want to return there.  Asked CSW to check with husband who does have specific SNF in mind.  CSW spoke with husband Jeneen Rinks, who confirms plan for SNF, asking for [facility].    Sent out for SNF, reached out to [facility] to review.    1130: [facility] does offer bed.    Auth request submitted in [place].    Expected Discharge Plan: [place] (vs SNF) Barriers to Discharge: Continued Medical Work up  Expected Discharge Plan and Services                                               Social Determinants of Health (SDOH) Interventions SDOH Screenings   Food Insecurity: No Food Insecurity (12/12/2022)  Housing: Low Risk  (12/12/2022)  Transportation Needs: No Transportation Needs (12/12/2022)  Utilities: Not At Risk (12/12/2022)  Tobacco Use: Low Risk  (12/14/2022)    Readmission Risk Interventions    11/14/2022   11:31 AM 11/12/2022    4:06 PM 10/18/2022   12:42 PM  Readmission Risk Prevention Plan  Transportation Screening  Complete Complete  Medication Review [employer])  Complete Complete  PCP or Specialist appointment within 3-5 days of discharge Complete Not Complete   HRI or Home Care Consult  Complete Complete  SW Recovery Care/Counseling Consult  Complete Complete  Palliative Care Screening  Not Applicable Not Applicable  Skilled Nursing Facility  Complete Complete

## 2022-12-18 DIAGNOSIS — S72332D Displaced oblique fracture of shaft of left femur, subsequent encounter for closed fracture with routine healing: Secondary | ICD-10-CM | POA: Diagnosis not present

## 2022-12-18 DIAGNOSIS — R41841 Cognitive communication deficit: Secondary | ICD-10-CM | POA: Diagnosis not present

## 2022-12-18 DIAGNOSIS — T148XXA Other injury of unspecified body region, initial encounter: Secondary | ICD-10-CM | POA: Diagnosis not present

## 2022-12-18 DIAGNOSIS — R52 Pain, unspecified: Secondary | ICD-10-CM | POA: Diagnosis not present

## 2022-12-18 DIAGNOSIS — M109 Gout, unspecified: Secondary | ICD-10-CM | POA: Diagnosis not present

## 2022-12-18 DIAGNOSIS — D631 Anemia in chronic kidney disease: Secondary | ICD-10-CM | POA: Diagnosis not present

## 2022-12-18 DIAGNOSIS — I5032 Chronic diastolic (congestive) heart failure: Secondary | ICD-10-CM | POA: Diagnosis not present

## 2022-12-18 DIAGNOSIS — N184 Chronic kidney disease, stage 4 (severe): Secondary | ICD-10-CM | POA: Diagnosis not present

## 2022-12-18 DIAGNOSIS — R300 Dysuria: Secondary | ICD-10-CM | POA: Diagnosis not present

## 2022-12-18 DIAGNOSIS — E1143 Type 2 diabetes mellitus with diabetic autonomic (poly)neuropathy: Secondary | ICD-10-CM | POA: Diagnosis not present

## 2022-12-18 DIAGNOSIS — Z4789 Encounter for other orthopedic aftercare: Secondary | ICD-10-CM | POA: Diagnosis not present

## 2022-12-18 DIAGNOSIS — R531 Weakness: Secondary | ICD-10-CM | POA: Diagnosis not present

## 2022-12-18 DIAGNOSIS — Z515 Encounter for palliative care: Secondary | ICD-10-CM | POA: Diagnosis not present

## 2022-12-18 DIAGNOSIS — S72401A Unspecified fracture of lower end of right femur, initial encounter for closed fracture: Secondary | ICD-10-CM | POA: Diagnosis not present

## 2022-12-18 DIAGNOSIS — S79929A Unspecified injury of unspecified thigh, initial encounter: Secondary | ICD-10-CM | POA: Diagnosis not present

## 2022-12-18 DIAGNOSIS — E1165 Type 2 diabetes mellitus with hyperglycemia: Secondary | ICD-10-CM | POA: Diagnosis not present

## 2022-12-18 DIAGNOSIS — R5381 Other malaise: Secondary | ICD-10-CM | POA: Diagnosis not present

## 2022-12-18 DIAGNOSIS — Z7401 Bed confinement status: Secondary | ICD-10-CM | POA: Diagnosis not present

## 2022-12-18 DIAGNOSIS — I2510 Atherosclerotic heart disease of native coronary artery without angina pectoris: Secondary | ICD-10-CM | POA: Diagnosis not present

## 2022-12-18 DIAGNOSIS — M6281 Muscle weakness (generalized): Secondary | ICD-10-CM | POA: Diagnosis not present

## 2022-12-18 DIAGNOSIS — W19XXXD Unspecified fall, subsequent encounter: Secondary | ICD-10-CM | POA: Diagnosis not present

## 2022-12-18 DIAGNOSIS — B3749 Other urogenital candidiasis: Secondary | ICD-10-CM | POA: Diagnosis not present

## 2022-12-18 DIAGNOSIS — N189 Chronic kidney disease, unspecified: Secondary | ICD-10-CM | POA: Diagnosis not present

## 2022-12-18 LAB — BASIC METABOLIC PANEL
Anion gap: 8 (ref 5–15)
BUN: 41 mg/dL — ABNORMAL HIGH (ref 8–23)
CO2: 17 mmol/L — ABNORMAL LOW (ref 22–32)
Calcium: 8.8 mg/dL — ABNORMAL LOW (ref 8.9–10.3)
Chloride: 111 mmol/L (ref 98–111)
Creatinine, Ser: 1.76 mg/dL — ABNORMAL HIGH (ref 0.44–1.00)
GFR, Estimated: 31 mL/min — ABNORMAL LOW (ref >60)
Glucose, Bld: 142 mg/dL — ABNORMAL HIGH (ref 70–99)
Potassium: 4.7 mmol/L (ref 3.5–5.1)
Sodium: 136 mmol/L (ref 135–145)

## 2022-12-18 LAB — GLUCOSE, CAPILLARY
Glucose-Capillary: 136 mg/dL — ABNORMAL HIGH (ref 70–99)
Glucose-Capillary: 144 mg/dL — ABNORMAL HIGH (ref 70–99)

## 2022-12-18 NOTE — TOC Progression Note (Addendum)
Transition of Care [facility]) - Progression Note    Patient Details  Name: AMELDA CUTAIA MRN: [id number] Date of Birth: December 17, 1950  [facility]) CM/SW Contact  Joanne Chars, LCSW Phone Number: 12/18/2022, 8:21 AM  Clinical Narrative:   SNF auth approved: [id number], [id number], 3 days: 2/20-2/22.  MD informed.   Confirmed with [facility] that they can receive pt today.   Expected Discharge Plan: [place] (vs SNF) Barriers to Discharge: Continued Medical Work up  Expected Discharge Plan and Services                                               Social Determinants of Health (SDOH) Interventions SDOH Screenings   Food Insecurity: No Food Insecurity (12/12/2022)  Housing: Low Risk  (12/12/2022)  Transportation Needs: No Transportation Needs (12/12/2022)  Utilities: Not At Risk (12/12/2022)  Tobacco Use: Low Risk  (12/17/2022)    Readmission Risk Interventions    11/14/2022   11:31 AM 11/12/2022    4:06 PM 10/18/2022   12:42 PM  Readmission Risk Prevention Plan  Transportation Screening  Complete Complete  Medication Review [employer])  Complete Complete  PCP or Specialist appointment within 3-5 days of discharge Complete Not Complete   HRI or Home Care Consult  Complete Complete  SW Recovery Care/Counseling Consult  Complete Complete  Palliative Care Screening  Not Applicable Not Applicable  Skilled Nursing Facility  Complete Complete

## 2022-12-18 NOTE — Care Management Important Message (Signed)
Important Message  Patient Details  Name: Heather [name] MRN: [id number] Date of Birth: 07-28-1951   Medicare Important Message Given:  Yes     [name] 12/18/2022, 11:37 AM

## 2022-12-18 NOTE — Discharge Summary (Signed)
Physician Discharge Summary  Heather [name] [id number] DOB: Mar 17, 1951 DOA: 12/11/2022  PCP: [name], MD  Admit date: 12/11/2022 Discharge date: 12/18/2022  Admitted From: home Disposition:  SNF  Recommendations for Outpatient Follow-up:  Follow up with PCP in 1-2 weeks Please obtain BMP/CBC in one week Follow up with orthopedic surgery in 1-2 weeks  Home Health: none Equipment/Devices: none  Discharge Condition: stable CODE STATUS: Full code Diet Orders (From admission, onward)     Start     Ordered   12/14/22 1708  Diet Carb Modified Fluid consistency: Thin; Room service appropriate? Yes  Diet effective now       Question Answer Comment  Calorie Level Medium 1600-2000   Fluid consistency: Thin   Room service appropriate? Yes      12/14/22 1707            HPI: Per admitting MD, Heather [name] is a 72 y.o. female with medical history significant for diabetes mellitus, hypertension, CKD 4, diastolic CHF. Patient presented to the ED with complaints of fall.  Patient reports history of frequent falls.  She is supposed to use a walker for ambulation.  Today she rolled out of the bed and fell on the floor.  She was unable to get up and was complaining right lower extremity pain.  She reports some urinary symptoms over the past few days, she otherwise denies difficulty breathing, no chest pain.  Hospital Course / Discharge diagnoses: Principal Problem:   Fracture Active Problems:   Recurrent falls   Uncontrolled type 2 diabetes mellitus with hyperglycemia, with long-term current use of insulin (HCC)   Hypertension   Chronic diastolic CHF (congestive heart failure) (HCC)   Chronic kidney disease (CKD), stage IV (severe) (HCC)   Anemia due to chronic kidney disease   Principal problem Right distal femur fracture -orthopedic surgery consult, appreciate input.  She is status post ORIF of the right supracondylar distal femur fracture and removal of  hardware of the right femur by Dr. Doreatha Martin on 12/14/2022.  Monitor postoperatively, PT recommends SNF   Active problems Essential hypertension-continue home regimen  Troponin elevation - likely due to demand ischemia in the setting of surgical stress. She did have a minute long episode of chest pain on 2/17 worse with deep breathing, very atypical for ACS. For completeness a 2d echo was obtained which showed normal EF without WMA. No further issues  Chronic diastolic CHF -she appears euvolemic. Apparently she is no longer taking Lasix. Monitor volume status / daily weights and reintroduce Lasix as indicated CKD stage IV -variable baseline, 2.5-3.5. Better than baseline  UTI - UA suggestive on infection and she is symptomatic with pain / burning with urination.  Completed 5 days of ceftriaxone. Cultures showing lactobacillus.  Hypomagnesemia-replaced Hyperkalemia-mild, due to chronic kidney disease. K improved after lokelma. Monitor periodically Anemia of chronic renal disease, acute blood loss anemia - s/p 2 U of PRBC total. Hb now stable DM2, uncontrolled, with hyperglycemia -A1c over 15.  Sepsis ruled out   Discharge Instructions   Allergies as of 12/18/2022       Reactions   Cozaar [losartan] Nausea And Vomiting, Swelling, Rash   Quinolones Itching, Nausea And Vomiting, Nausea Only, Swelling   Swelling of face, jaw, and lips   Cymbalta [duloxetine Hcl] Itching, Swelling   Sulfa Antibiotics Nausea And Vomiting, Swelling   Headache  Swelling of feet, legs   Synalar [fluocinolone] Swelling   Cipro [ciprofloxacin Hcl] Nausea And Vomiting, Swelling  Swelling of face, jaw, lips   Diovan [valsartan] Nausea And Vomiting, Swelling   Glucophage [metformin] Other (See Comments)   GI upset   Ketalar [ketamine] Swelling   Norco [hydrocodone-acetaminophen] Itching, Swelling   Norvasc [amlodipine Besylate] Itching, Swelling   Fluid retention    Nsaids Nausea And Vomiting, Other (See  Comments)   Has bleeding ulcers   Tape Itching, Rash   Paper tape can only be used on this patient   Zestril [lisinopril] Swelling, Rash   Oral swelling Red streaks on arms/legs        Medication List     STOP taking these medications    furosemide 40 MG tablet Commonly known as: LASIX   naproxen sodium 220 MG tablet Commonly known as: ALEVE   traMADol 50 MG tablet Commonly known as: ULTRAM       TAKE these medications    acetaminophen 325 MG tablet Commonly known as: TYLENOL Take 2 tablets (650 mg total) by mouth every 4 (four) hours as needed for mild pain, moderate pain, fever or headache.   allopurinol 100 MG tablet Commonly known as: ZYLOPRIM Take 100 mg by mouth daily.   apixaban 2.5 MG Tabs tablet Commonly known as: Eliquis Take 1 tablet (2.5 mg total) by mouth 2 (two) times daily.   atorvastatin 80 MG tablet Commonly known as: LIPITOR Take 1 tablet (80 mg total) by mouth daily. Restart on 06/29/22   diphenhydrAMINE 2 % cream Commonly known as: BENADRYL Apply topically 3 (three) times daily as needed for itching.   gabapentin 300 MG capsule Commonly known as: NEURONTIN Take 1 capsule by mouth at bedtime.   hydrALAZINE 50 MG tablet Commonly known as: APRESOLINE Take 1 tablet (50 mg total) by mouth every 8 (eight) hours.   insulin aspart 100 UNIT/ML FlexPen Commonly known as: NOVOLOG Inject 5 Units into the skin 3 (three) times daily with meals. Give only if eats 50% or more of meal. What changed:  how much to take when to take this   insulin glargine-yfgn 100 UNIT/ML Pen Commonly known as: SEMGLEE INJECT 15 UNITS UNDER THE SKIN ONCE DAILY   levETIRAcetam 500 MG tablet Commonly known as: KEPPRA Take 500 mg by mouth 2 (two) times daily.   lipase/protease/amylase 36000 UNITS Cpep capsule Commonly known as: CREON Take 36,000 Units by mouth 3 (three) times daily with meals.   methocarbamol 750 MG tablet Commonly known as: ROBAXIN Take 1  tablet (750 mg total) by mouth every 6 (six) hours as needed for muscle spasms. What changed:  medication strength how much to take when to take this   metoprolol succinate 25 MG 24 hr tablet Commonly known as: TOPROL-XL Take 1.5 tablets (37.5 mg total) by mouth daily.   naloxone 4 MG/0.1ML Liqd nasal spray kit Commonly known as: NARCAN Place 1 spray into the nose once.   Oxycodone HCl 10 MG Tabs Take 0.5-1 tablets (5-10 mg total) by mouth every 4 (four) hours as needed for severe pain or moderate pain (5 mg pain scale 4-6, 10 mg pain scale 7-9).   polyethylene glycol 17 g packet Commonly known as: MIRALAX / GLYCOLAX Take 17 g by mouth daily.   venlafaxine XR 150 MG 24 hr capsule Commonly known as: EFFEXOR-XR Take 150 mg by mouth daily.        Follow-up Information     Haddix, Thomasene Lot, MD. Schedule an appointment as soon as possible for a visit in 2 week(s).   Specialty: Orthopedic Surgery Why:  for wound check and repeat x-rays Contact information: [address] [place] [id number]                 Consultations: Orthopedic surgery   Procedures/Studies:  ECHOCARDIOGRAM COMPLETE  Result Date: 12/17/2022    ECHOCARDIOGRAM REPORT   Patient Name:   [name] Date of Exam: 12/17/2022 Medical Rec #:  [id number]           Height:       64.0 in Accession #:    [id number]          Weight:       114.6 lb Date of Birth:  02/06/1951           BSA:          1.544 m Patient Age:    72 years            BP:           124/74 mmHg Patient Gender: F                   HR:           104 bpm. Exam Location:  Inpatient Procedure: 2D Echo, Cardiac Doppler and Color Doppler Indications:    R07.9 Chest Pain  History:        Patient has prior history of Echocardiogram examinations, most                 recent 08/21/2021. CKD and Stroke; Risk Factors:Hypertension and                 Diabetes.  Sonographer:    Wilkie Aye RVT RCS Referring Phys: [place]  1. Left ventricular ejection fraction, by estimation, is 60 to 65%. The left ventricle has normal function. The left ventricle has no regional wall motion abnormalities. Left ventricular diastolic parameters are consistent with Grade I diastolic dysfunction (impaired relaxation).  2. Right ventricular systolic function is normal. The right ventricular size is normal.  3. Left atrial size was moderately dilated.  4. Right atrial size was mildly dilated.  5. The mitral valve is normal in structure. Trivial mitral valve regurgitation. No evidence of mitral stenosis. Moderate mitral annular calcification.  6. The aortic valve is tricuspid. There is moderate calcification of the aortic valve. Aortic valve regurgitation is not visualized. Mild aortic valve stenosis. Aortic valve area, by VTI measures 1.95 cm. Aortic valve mean gradient measures 7.0 mmHg. Aortic valve Vmax measures 1.69 m/s.  7. The inferior vena cava is normal in size with greater than 50% respiratory variability, suggesting right atrial pressure of 3 mmHg. Comparison(s): EF 60-65%. FINDINGS  Left Ventricle: Left ventricular ejection fraction, by estimation, is 60 to 65%. The left ventricle has normal function. The left ventricle has no regional wall motion abnormalities. The left ventricular internal cavity size was normal in size. There is  no left ventricular hypertrophy. Left ventricular diastolic parameters are consistent with Grade I diastolic dysfunction (impaired relaxation). Right Ventricle: The right ventricular size is normal. No increase in right ventricular wall thickness. Right ventricular systolic function is normal. Left Atrium: Left atrial size was moderately dilated. Right Atrium: Right atrial size was mildly dilated. Pericardium: There is no evidence of pericardial effusion. Mitral Valve: The mitral valve is normal in structure. Moderate mitral annular calcification. Trivial mitral valve regurgitation. No evidence  of mitral valve stenosis. Tricuspid Valve: The tricuspid valve is normal in structure. Tricuspid valve  regurgitation is trivial. No evidence of tricuspid stenosis. Aortic Valve: The aortic valve is tricuspid. There is moderate calcification of the aortic valve. Aortic valve regurgitation is not visualized. Mild aortic stenosis is present. Aortic valve mean gradient measures 7.0 mmHg. Aortic valve peak gradient measures 11.4 mmHg. Aortic valve area, by VTI measures 1.95 cm. Pulmonic Valve: The pulmonic valve was normal in structure. Pulmonic valve regurgitation is trivial. No evidence of pulmonic stenosis. Aorta: The aortic root is normal in size and structure. Venous: The inferior vena cava is normal in size with greater than 50% respiratory variability, suggesting right atrial pressure of 3 mmHg. IAS/Shunts: No atrial level shunt detected by color flow Doppler.  LEFT VENTRICLE PLAX 2D LVIDd:         4.50 cm   Diastology LVIDs:         3.00 cm   LV e' medial:    7.94 cm/s LV PW:         0.90 cm   LV E/e' medial:  15.1 LV IVS:        0.90 cm   LV e' lateral:   8.81 cm/s LVOT diam:     1.60 cm   LV E/e' lateral: 13.6 LV SV:         54 LV SV Index:   35 LVOT Area:     2.01 cm  RIGHT VENTRICLE             IVC RV Basal diam:  2.90 cm     IVC diam: 1.40 cm RV Mid diam:    2.90 cm RV S prime:     14.70 cm/s TAPSE (M-mode): 2.4 cm LEFT ATRIUM             Index        RIGHT ATRIUM           Index LA diam:        4.00 cm 2.59 cm/m   RA Area:     13.10 cm LA Vol (A2C):   71.8 ml 46.49 ml/m  RA Volume:   32.20 ml  20.85 ml/m LA Vol (A4C):   70.1 ml 45.39 ml/m LA Biplane Vol: 78.0 ml 50.51 ml/m  AORTIC VALVE                     PULMONIC VALVE AV Area (Vmax):    1.86 cm      PV Vmax:       1.06 m/s AV Area (Vmean):   1.80 cm      PV Peak grad:  4.5 mmHg AV Area (VTI):     1.95 cm AV Vmax:           169.00 cm/s AV Vmean:          119.500 cm/s AV VTI:            0.279 m AV Peak Grad:      11.4 mmHg AV Mean Grad:      7.0  mmHg LVOT Vmax:         156.00 cm/s LVOT Vmean:        107.000 cm/s LVOT VTI:          0.271 m LVOT/AV VTI ratio: 0.97  AORTA Ao Root diam: 2.80 cm Ao Asc diam:  3.00 cm Ao Arch diam: 2.1 cm MITRAL VALVE                TRICUSPID VALVE MV Area (PHT): 7.51 cm  TR Peak grad:   37.0 mmHg MV Decel Time: 101 msec     TR Vmax:        304.00 cm/s MV E velocity: 120.00 cm/s MV A velocity: 129.00 cm/s  SHUNTS MV E/A ratio:  0.93         Systemic VTI:  0.27 m                             Systemic Diam: 1.60 cm Glori Bickers MD Electronically signed by Glori Bickers MD Signature Date/Time: 12/17/2022/1:40:31 PM    Final    DG CHEST PORT 1 VIEW  Result Date: 12/15/2022 CLINICAL DATA:  Chest pain and shortness of breath EXAM: PORTABLE CHEST 1 VIEW COMPARISON:  12/11/2022 FINDINGS: Cardiac shadow is stable. Aortic calcifications are again seen. Postsurgical changes are noted in the cervical and lumbar spine stable from the prior exam. Old right clavicular and right humeral fractures are seen and stable. The lungs are well aerated without focal infiltrate or effusion. IMPRESSION: No acute abnormality identified. Electronically Signed   By: Inez Catalina M.D.   On: 12/15/2022 18:26   DG FEMUR PORT, MIN 2 VIEWS RIGHT  Result Date: 12/14/2022 CLINICAL DATA:  [id number] Fracture, femur ([place]) [id number] EXAM: RIGHT FEMUR PORTABLE 2 VIEW COMPARISON:  12/11/2012 FINDINGS: Interval lateral plate and screw fixation of oblique distal femoral fracture, fragments in near anatomic alignment. Slight screw and IM rod across the right femoral neck, with interval removal of the interlocking screw. Knee arthroplasty components stable in location. Extensive femoropopliteal arterial calcifications. IMPRESSION: Interval plate and screw fixation of distal femoral fracture, in near anatomic alignment. Electronically Signed   By: Lucrezia Europe M.D.   On: 12/14/2022 17:43   DG Knee Complete 4 Views Right  Result Date: 12/14/2022 CLINICAL DATA:   Surgical internal fixation of distal right femoral fracture. EXAM: RIGHT KNEE - COMPLETE 4+ VIEW; DG C-ARM 1-60 MIN-NO REPORT Radiation exposure index: 4.41 mGy. COMPARISON:  December 11, 2022. FINDINGS: Six intraoperative fluoroscopic images were obtained of the right femur. These demonstrate surgical internal fixation of distal right femoral fracture. IMPRESSION: Fluoroscopic guidance provided during surgical internal fixation of distal right femoral fracture. Electronically Signed   By: Marijo Conception M.D.   On: 12/14/2022 15:45   DG C-Arm 1-60 Min-No Report  Result Date: 12/14/2022 Fluoroscopy was utilized by the requesting physician.  No radiographic interpretation.   CT Cervical Spine Wo Contrast  Result Date: 12/11/2022 CLINICAL DATA:  Blunt trauma due to a fall. EXAM: CT CERVICAL SPINE WITHOUT CONTRAST TECHNIQUE: Multidetector CT imaging of the cervical spine was performed without intravenous contrast. Multiplanar CT image reconstructions were also generated. RADIATION DOSE REDUCTION: This exam was performed according to the departmental dose-optimization program which includes automated exposure control, adjustment of the mA and/or kV according to patient size and/or use of iterative reconstruction technique. COMPARISON:  11/11/2022 FINDINGS: Alignment: Normal alignment. Skull base and vertebrae: Skull base appears intact. No vertebral compression deformities. No focal bone lesion or bone destruction. Soft tissues and spinal canal: No prevertebral soft tissue swelling. No abnormal paraspinal soft tissue mass or infiltration. Vascular calcifications. Disc levels: Postoperative changes with anterior plate and screw fixation at C3-4 and C7-T1. Posterior rod and screw fixation at C7-T1. Intervertebral disc fusion at C3-4, C4-5, C5-6, C6-7, and C7-T1 levels. Degenerative changes throughout the posterior facet joints. Upper chest: Lung apices are clear. Other: Appearances are similar to prior study.  IMPRESSION: 1.  Normal alignment.  No acute displaced fractures are identified. 2. Postoperative changes with intervertebral disc fusion throughout the cervical spine and internal fixation as discussed above. Electronically Signed   By: Lucienne Capers M.D.   On: 12/11/2022 18:03   CT Head Wo Contrast  Result Date: 12/11/2022 CLINICAL DATA:  Ataxia.  Head trauma.  Patient fell at home. EXAM: CT HEAD WITHOUT CONTRAST TECHNIQUE: Contiguous axial images were obtained from the base of the skull through the vertex without intravenous contrast. RADIATION DOSE REDUCTION: This exam was performed according to the departmental dose-optimization program which includes automated exposure control, adjustment of the mA and/or kV according to patient size and/or use of iterative reconstruction technique. COMPARISON:  CT head 11/11/2022.  MRI brain 08/23/2022 FINDINGS: Brain: Mild diffuse cerebral atrophy. Low-attenuation changes in the deep white matter consistent small vessel ischemia. No mass-effect or midline shift. No abnormal extra-axial fluid collections. Gray-white matter junctions are mostly distinct. Basal cisterns are not effaced. No acute intracranial hemorrhage. Vascular: Diffuse vascular calcification. Skull: Calvarium appears intact. Sinuses/Orbits: Motion artifact limits evaluation but paranasal sinuses and mastoid air cells appear clear. Other: None. IMPRESSION: No acute intracranial abnormalities. Chronic atrophy and small vessel ischemic changes. Electronically Signed   By: Lucienne Capers M.D.   On: 12/11/2022 17:59   DG FEMUR, MIN 2 VIEWS RIGHT  Result Date: 12/11/2022 CLINICAL DATA:  [place] today with pain EXAM: RIGHT FEMUR 2 VIEWS COMPARISON:  11/28/2021 FINDINGS: No change. Previous gamma nail placement for treatment of distal intertrochanteric fracture. No evidence of recent injury in that area. No advanced osteoarthritis of the right hip. Acute displaced oblique fracture of the distal femur at the  diaphyseal metaphyseal junction. IMPRESSION: Displaced oblique fracture of the distal femur at the diaphyseal metaphyseal junction. Previous gamma nail placement for treatment of intertrochanteric fracture. Electronically Signed   By: Nelson Chimes M.D.   On: 12/11/2022 17:21   DG Chest Port 1 View  Result Date: 12/11/2022 CLINICAL DATA:  Hip fracture. EXAM: PORTABLE CHEST 1 VIEW COMPARISON:  08/22/2022 FINDINGS: The cardiac silhouette, mediastinal hilar contours are within normal limits. The lungs are clear. No pleural effusions or pneumothorax. Remote trauma involving the right shoulder. Cervical fusion hardware noted. IMPRESSION: No acute cardiopulmonary findings. Electronically Signed   By: Marijo Sanes M.D.   On: 12/11/2022 17:16    Subjective: - no chest pain, shortness of breath, no abdominal pain, nausea or vomiting.   Discharge Exam: BP (!) 172/68 (BP Location: Left Arm)   Pulse 100   Temp 98 F (36.7 C) (Oral)   Resp 15   Ht 5' 4"$  (1.626 m)   Wt 52 kg   LMP  (LMP Unknown)   SpO2 99%   BMI 19.68 kg/m   General: Pt is alert, awake, not in acute distress Cardiovascular: RRR, S1/S2 +, no rubs, no gallops Respiratory: CTA bilaterally, no wheezing, no rhonchi Abdominal: Soft, NT, ND, bowel sounds + Extremities: no edema, no cyanosis    The results of significant diagnostics from this hospitalization (including imaging, microbiology, ancillary and laboratory) are listed below for reference.     Microbiology: Recent Results (from the past 240 hour(s))  Urine Culture (for pregnant, neutropenic or urologic patients or patients with an indwelling urinary catheter)     Status: Abnormal   Collection Time: 12/12/22  5:29 PM   Specimen: Urine, Catheterized  Result Value Ref Range Status   Specimen Description URINE, CATHETERIZED  Final   Special Requests NONE  Final   Culture (  A)  Final    >=100,000 COLONIES/mL LACTOBACILLUS SPECIES Standardized susceptibility testing for this  organism is not available. Performed at [facility], [place] [address], [place], [place] [address]    Report Status 12/13/2022 FINAL  Final     Labs: Basic Metabolic Panel: Recent Labs  Lab 12/13/22 0445 12/15/22 0318 12/16/22 0420 12/17/22 0509 12/18/22 0700  NA 134* 135 135 139 136  K 4.8 5.2* 5.1 5.3* 4.7  CL 110 110 109 111 111  CO2 13* 14* 14* 14* 17*  GLUCOSE 125* 153* 70 81 142*  BUN 64* 54* 49* 47* 41*  CREATININE 2.66* 2.42* 2.24* 2.01* 1.76*  CALCIUM 8.3* 8.5* 8.4* 9.4 8.8*  MG 1.2* 1.6* 1.8 1.8  --    Liver Function Tests: Recent Labs  Lab 12/13/22 0445 12/15/22 0318 12/17/22 0509  AST 17 21 23  $ ALT 17 10 8  $ ALKPHOS 105 111 100  BILITOT 0.5 0.2* 0.6  PROT 5.0* 5.4* 5.1*  ALBUMIN 2.1* 2.2* 2.0*   CBC: Recent Labs  Lab 12/11/22 1737 12/12/22 0253 12/13/22 0445 12/13/22 2010 12/15/22 0318 12/16/22 0611 12/16/22 1808 12/17/22 0509  WBC 9.5 10.2 11.5*  --  11.2* 8.8  --  9.4  NEUTROABS 7.9*  --   --   --   --   --   --   --   HGB 7.8* 7.8* 6.6* 8.3* 8.3* 6.8* 8.2* 8.4*  HCT 24.9* 24.3* 19.5* 24.2* 25.0* 21.4* 24.5* 25.9*  MCV 97.6 94.6 92.9  --  93.3 95.1  --  94.2  PLT 255 269 181  --  219 172  --  152   CBG: Recent Labs  Lab 12/17/22 1712 12/17/22 1945 12/17/22 2326 12/18/22 0407 12/18/22 0728  GLUCAP 143* 138* 220* 136* 144*   Hgb A1c No results for input(s): "HGBA1C" in the last 72 hours. Lipid Profile No results for input(s): "CHOL", "HDL", "LDLCALC", "TRIG", "CHOLHDL", "LDLDIRECT" in the last 72 hours. Thyroid function studies No results for input(s): "TSH", "T4TOTAL", "T3FREE", "THYROIDAB" in the last 72 hours.  Invalid input(s): "FREET3" Urinalysis    Component Value Date/Time   COLORURINE STRAW (A) 12/12/2022 1232   APPEARANCEUR TURBID (A) 12/12/2022 1232   LABSPEC RESULTS UNAVAILABLE DUE TO INTERFERING SUBSTANCE 12/12/2022 1232   PHURINE RESULTS UNAVAILABLE DUE TO INTERFERING SUBSTANCE 12/12/2022 1232   GLUCOSEU  RESULTS UNAVAILABLE DUE TO INTERFERING SUBSTANCE (A) 12/12/2022 1232   GLUCOSEU NEGATIVE 03/28/2018 1224   HGBUR RESULTS UNAVAILABLE DUE TO INTERFERING SUBSTANCE (A) 12/12/2022 1232   BILIRUBINUR RESULTS UNAVAILABLE DUE TO INTERFERING SUBSTANCE (A) 12/12/2022 1232   KETONESUR RESULTS UNAVAILABLE DUE TO INTERFERING SUBSTANCE (A) 12/12/2022 1232   PROTEINUR RESULTS UNAVAILABLE DUE TO INTERFERING SUBSTANCE (A) 12/12/2022 1232   UROBILINOGEN 1.0 03/28/2018 1224   NITRITE RESULTS UNAVAILABLE DUE TO INTERFERING SUBSTANCE (A) 12/12/2022 1232   LEUKOCYTESUR RESULTS UNAVAILABLE DUE TO INTERFERING SUBSTANCE (A) 12/12/2022 1232    FURTHER DISCHARGE INSTRUCTIONS:   Get Medicines reviewed and adjusted: Please take all your medications with you for your next visit with your Primary MD   Laboratory/radiological data: Please request your Primary MD to go over all hospital tests and procedure/radiological results at the follow up, please ask your Primary MD to get all Hospital records sent to his/her office.   In some cases, they will be blood work, cultures and biopsy results pending at the time of your discharge. Please request that your primary care M.D. goes through all the records of your hospital data and  follows up on these results.   Also Note the following: If you experience worsening of your admission symptoms, develop shortness of breath, life threatening emergency, suicidal or homicidal thoughts you must seek medical attention immediately by calling 911 or calling your MD immediately  if symptoms less severe.   You must read complete instructions/literature along with all the possible adverse reactions/side effects for all the Medicines you take and that have been prescribed to you. Take any new Medicines after you have completely understood and accpet all the possible adverse reactions/side effects.    Do not drive when taking Pain medications or sleeping medications (Benzodaizepines)   Do  not take more than prescribed Pain, Sleep and Anxiety Medications. It is not advisable to combine anxiety,sleep and pain medications without talking with your primary care practitioner   Special Instructions: If you have smoked or chewed Tobacco  in the last 2 yrs please stop smoking, stop any regular Alcohol  and or any Recreational drug use.   Wear Seat belts while driving.   Please note: You were cared for by a hospitalist during your hospital stay. Once you are discharged, your primary care physician will handle any further medical issues. Please note that NO REFILLS for any discharge medications will be authorized once you are discharged, as it is imperative that you return to your primary care physician (or establish a relationship with a primary care physician if you do not have one) for your post hospital discharge needs so that they can reassess your need for medications and monitor your lab values.  Time coordinating discharge: 35 minutes  SIGNED:  Marzetta Board, MD, PhD 12/18/2022, 8:24 AM

## 2022-12-18 NOTE — Plan of Care (Signed)

## 2022-12-18 NOTE — TOC Transition Note (Addendum)
Transition of Care [facility]) - CM/SW Discharge Note   Patient Details  Name: Heather [name] MRN: [id number] Date of Birth: August 06, 1951  Transition of Care [facility]) CM/SW Contact:  Joanne Chars, LCSW Phone Number: 12/18/2022, 10:09 AM   Clinical Narrative:   Pt discharging to [facility].  RN call report to [phone].     Final next level of care: Skilled Nursing Facility Barriers to Discharge: Barriers Resolved   Patient Goals and CMS Choice      Discharge Placement                Patient chooses bed at:  [facility]) Patient to be transferred to facility by: [place] Name of family member notified: unable to reach husband Heather [name], unable to leave message  1030: husband called back and was informed of transfer today Patient and family notified of of transfer: 12/18/22  Discharge Plan and Services Additional resources added to the After Visit Summary for                                       Social Determinants of Health (SDOH) Interventions SDOH Screenings   Food Insecurity: No Food Insecurity (12/12/2022)  Housing: Low Risk  (12/12/2022)  Transportation Needs: No Transportation Needs (12/12/2022)  Utilities: Not At Risk (12/12/2022)  Tobacco Use: Low Risk  (12/17/2022)     Readmission Risk Interventions    11/14/2022   11:31 AM 11/12/2022    4:06 PM 10/18/2022   12:42 PM  Readmission Risk Prevention Plan  Transportation Screening  Complete Complete  Medication Review Press photographer)  Complete Complete  PCP or Specialist appointment within 3-5 days of discharge Complete Not Complete   HRI or Home Care Consult  Complete Complete  SW Recovery Care/Counseling Consult  Complete Complete  Palliative Care Screening  Not Applicable Not Applicable  Skilled Nursing Facility  Complete Complete

## 2022-12-19 ENCOUNTER — Ambulatory Visit: Payer: Medicare PPO | Admitting: Neurology

## 2022-12-19 DIAGNOSIS — N184 Chronic kidney disease, stage 4 (severe): Secondary | ICD-10-CM | POA: Diagnosis not present

## 2022-12-19 DIAGNOSIS — M109 Gout, unspecified: Secondary | ICD-10-CM | POA: Diagnosis not present

## 2022-12-19 DIAGNOSIS — N189 Chronic kidney disease, unspecified: Secondary | ICD-10-CM | POA: Diagnosis not present

## 2022-12-19 DIAGNOSIS — D631 Anemia in chronic kidney disease: Secondary | ICD-10-CM | POA: Diagnosis not present

## 2022-12-19 DIAGNOSIS — R5381 Other malaise: Secondary | ICD-10-CM | POA: Diagnosis not present

## 2022-12-19 DIAGNOSIS — E1143 Type 2 diabetes mellitus with diabetic autonomic (poly)neuropathy: Secondary | ICD-10-CM | POA: Diagnosis not present

## 2022-12-19 DIAGNOSIS — S72401A Unspecified fracture of lower end of right femur, initial encounter for closed fracture: Secondary | ICD-10-CM | POA: Diagnosis not present

## 2022-12-19 DIAGNOSIS — I5032 Chronic diastolic (congestive) heart failure: Secondary | ICD-10-CM | POA: Diagnosis not present

## 2022-12-19 DIAGNOSIS — E1165 Type 2 diabetes mellitus with hyperglycemia: Secondary | ICD-10-CM | POA: Diagnosis not present

## 2022-12-19 DIAGNOSIS — Z515 Encounter for palliative care: Secondary | ICD-10-CM | POA: Diagnosis not present

## 2022-12-24 DIAGNOSIS — R300 Dysuria: Secondary | ICD-10-CM | POA: Diagnosis not present

## 2022-12-25 DIAGNOSIS — S72401A Unspecified fracture of lower end of right femur, initial encounter for closed fracture: Secondary | ICD-10-CM | POA: Diagnosis not present

## 2022-12-25 DIAGNOSIS — R5381 Other malaise: Secondary | ICD-10-CM | POA: Diagnosis not present

## 2022-12-26 DIAGNOSIS — R300 Dysuria: Secondary | ICD-10-CM | POA: Diagnosis not present

## 2022-12-26 DIAGNOSIS — B3749 Other urogenital candidiasis: Secondary | ICD-10-CM | POA: Diagnosis not present

## 2022-12-27 ENCOUNTER — Encounter: Payer: Self-pay | Admitting: Radiology

## 2023-01-01 DIAGNOSIS — R300 Dysuria: Secondary | ICD-10-CM | POA: Diagnosis not present

## 2023-01-03 DIAGNOSIS — S72401A Unspecified fracture of lower end of right femur, initial encounter for closed fracture: Secondary | ICD-10-CM | POA: Diagnosis not present

## 2023-01-03 DIAGNOSIS — I5032 Chronic diastolic (congestive) heart failure: Secondary | ICD-10-CM | POA: Diagnosis not present

## 2023-01-03 DIAGNOSIS — N184 Chronic kidney disease, stage 4 (severe): Secondary | ICD-10-CM | POA: Diagnosis not present

## 2023-01-03 DIAGNOSIS — E1143 Type 2 diabetes mellitus with diabetic autonomic (poly)neuropathy: Secondary | ICD-10-CM | POA: Diagnosis not present

## 2023-01-03 DIAGNOSIS — R52 Pain, unspecified: Secondary | ICD-10-CM | POA: Diagnosis not present

## 2023-01-03 DIAGNOSIS — E1165 Type 2 diabetes mellitus with hyperglycemia: Secondary | ICD-10-CM | POA: Diagnosis not present

## 2023-01-04 DIAGNOSIS — E1143 Type 2 diabetes mellitus with diabetic autonomic (poly)neuropathy: Secondary | ICD-10-CM | POA: Diagnosis not present

## 2023-01-04 DIAGNOSIS — D631 Anemia in chronic kidney disease: Secondary | ICD-10-CM | POA: Diagnosis not present

## 2023-01-04 DIAGNOSIS — S72401A Unspecified fracture of lower end of right femur, initial encounter for closed fracture: Secondary | ICD-10-CM | POA: Diagnosis not present

## 2023-01-04 DIAGNOSIS — E1165 Type 2 diabetes mellitus with hyperglycemia: Secondary | ICD-10-CM | POA: Diagnosis not present

## 2023-01-04 DIAGNOSIS — N184 Chronic kidney disease, stage 4 (severe): Secondary | ICD-10-CM | POA: Diagnosis not present

## 2023-01-04 DIAGNOSIS — R5381 Other malaise: Secondary | ICD-10-CM | POA: Diagnosis not present

## 2023-01-04 DIAGNOSIS — M109 Gout, unspecified: Secondary | ICD-10-CM | POA: Diagnosis not present

## 2023-01-04 DIAGNOSIS — I5032 Chronic diastolic (congestive) heart failure: Secondary | ICD-10-CM | POA: Diagnosis not present

## 2023-01-04 DIAGNOSIS — N189 Chronic kidney disease, unspecified: Secondary | ICD-10-CM | POA: Diagnosis not present

## 2023-01-06 DIAGNOSIS — N390 Urinary tract infection, site not specified: Secondary | ICD-10-CM | POA: Diagnosis not present

## 2023-01-06 DIAGNOSIS — E1122 Type 2 diabetes mellitus with diabetic chronic kidney disease: Secondary | ICD-10-CM | POA: Diagnosis not present

## 2023-01-06 DIAGNOSIS — S72451D Displaced supracondylar fracture without intracondylar extension of lower end of right femur, subsequent encounter for closed fracture with routine healing: Secondary | ICD-10-CM | POA: Diagnosis not present

## 2023-01-06 DIAGNOSIS — I5032 Chronic diastolic (congestive) heart failure: Secondary | ICD-10-CM | POA: Diagnosis not present

## 2023-01-06 DIAGNOSIS — B9689 Other specified bacterial agents as the cause of diseases classified elsewhere: Secondary | ICD-10-CM | POA: Diagnosis not present

## 2023-01-06 DIAGNOSIS — E1165 Type 2 diabetes mellitus with hyperglycemia: Secondary | ICD-10-CM | POA: Diagnosis not present

## 2023-01-06 DIAGNOSIS — D62 Acute posthemorrhagic anemia: Secondary | ICD-10-CM | POA: Diagnosis not present

## 2023-01-06 DIAGNOSIS — I130 Hypertensive heart and chronic kidney disease with heart failure and stage 1 through stage 4 chronic kidney disease, or unspecified chronic kidney disease: Secondary | ICD-10-CM | POA: Diagnosis not present

## 2023-01-06 DIAGNOSIS — N184 Chronic kidney disease, stage 4 (severe): Secondary | ICD-10-CM | POA: Diagnosis not present

## 2023-01-07 DIAGNOSIS — N184 Chronic kidney disease, stage 4 (severe): Secondary | ICD-10-CM | POA: Diagnosis not present

## 2023-01-07 DIAGNOSIS — S72451D Displaced supracondylar fracture without intracondylar extension of lower end of right femur, subsequent encounter for closed fracture with routine healing: Secondary | ICD-10-CM | POA: Diagnosis not present

## 2023-01-07 DIAGNOSIS — D62 Acute posthemorrhagic anemia: Secondary | ICD-10-CM | POA: Diagnosis not present

## 2023-01-07 DIAGNOSIS — E1165 Type 2 diabetes mellitus with hyperglycemia: Secondary | ICD-10-CM | POA: Diagnosis not present

## 2023-01-07 DIAGNOSIS — I130 Hypertensive heart and chronic kidney disease with heart failure and stage 1 through stage 4 chronic kidney disease, or unspecified chronic kidney disease: Secondary | ICD-10-CM | POA: Diagnosis not present

## 2023-01-07 DIAGNOSIS — E1122 Type 2 diabetes mellitus with diabetic chronic kidney disease: Secondary | ICD-10-CM | POA: Diagnosis not present

## 2023-01-07 DIAGNOSIS — N390 Urinary tract infection, site not specified: Secondary | ICD-10-CM | POA: Diagnosis not present

## 2023-01-07 DIAGNOSIS — B9689 Other specified bacterial agents as the cause of diseases classified elsewhere: Secondary | ICD-10-CM | POA: Diagnosis not present

## 2023-01-07 DIAGNOSIS — I5032 Chronic diastolic (congestive) heart failure: Secondary | ICD-10-CM | POA: Diagnosis not present

## 2023-01-08 DIAGNOSIS — S72451D Displaced supracondylar fracture without intracondylar extension of lower end of right femur, subsequent encounter for closed fracture with routine healing: Secondary | ICD-10-CM | POA: Diagnosis not present

## 2023-01-08 DIAGNOSIS — D62 Acute posthemorrhagic anemia: Secondary | ICD-10-CM | POA: Diagnosis not present

## 2023-01-08 DIAGNOSIS — B9689 Other specified bacterial agents as the cause of diseases classified elsewhere: Secondary | ICD-10-CM | POA: Diagnosis not present

## 2023-01-08 DIAGNOSIS — N184 Chronic kidney disease, stage 4 (severe): Secondary | ICD-10-CM | POA: Diagnosis not present

## 2023-01-08 DIAGNOSIS — I5032 Chronic diastolic (congestive) heart failure: Secondary | ICD-10-CM | POA: Diagnosis not present

## 2023-01-08 DIAGNOSIS — I130 Hypertensive heart and chronic kidney disease with heart failure and stage 1 through stage 4 chronic kidney disease, or unspecified chronic kidney disease: Secondary | ICD-10-CM | POA: Diagnosis not present

## 2023-01-08 DIAGNOSIS — E1122 Type 2 diabetes mellitus with diabetic chronic kidney disease: Secondary | ICD-10-CM | POA: Diagnosis not present

## 2023-01-08 DIAGNOSIS — E1165 Type 2 diabetes mellitus with hyperglycemia: Secondary | ICD-10-CM | POA: Diagnosis not present

## 2023-01-08 DIAGNOSIS — N390 Urinary tract infection, site not specified: Secondary | ICD-10-CM | POA: Diagnosis not present

## 2023-01-10 DIAGNOSIS — N390 Urinary tract infection, site not specified: Secondary | ICD-10-CM | POA: Diagnosis not present

## 2023-01-10 DIAGNOSIS — E1165 Type 2 diabetes mellitus with hyperglycemia: Secondary | ICD-10-CM | POA: Diagnosis not present

## 2023-01-10 DIAGNOSIS — S72451D Displaced supracondylar fracture without intracondylar extension of lower end of right femur, subsequent encounter for closed fracture with routine healing: Secondary | ICD-10-CM | POA: Diagnosis not present

## 2023-01-10 DIAGNOSIS — E1122 Type 2 diabetes mellitus with diabetic chronic kidney disease: Secondary | ICD-10-CM | POA: Diagnosis not present

## 2023-01-10 DIAGNOSIS — I130 Hypertensive heart and chronic kidney disease with heart failure and stage 1 through stage 4 chronic kidney disease, or unspecified chronic kidney disease: Secondary | ICD-10-CM | POA: Diagnosis not present

## 2023-01-10 DIAGNOSIS — D62 Acute posthemorrhagic anemia: Secondary | ICD-10-CM | POA: Diagnosis not present

## 2023-01-10 DIAGNOSIS — B9689 Other specified bacterial agents as the cause of diseases classified elsewhere: Secondary | ICD-10-CM | POA: Diagnosis not present

## 2023-01-10 DIAGNOSIS — I5032 Chronic diastolic (congestive) heart failure: Secondary | ICD-10-CM | POA: Diagnosis not present

## 2023-01-10 DIAGNOSIS — N184 Chronic kidney disease, stage 4 (severe): Secondary | ICD-10-CM | POA: Diagnosis not present

## 2023-01-15 DIAGNOSIS — S72451D Displaced supracondylar fracture without intracondylar extension of lower end of right femur, subsequent encounter for closed fracture with routine healing: Secondary | ICD-10-CM | POA: Diagnosis not present

## 2023-01-15 DIAGNOSIS — D62 Acute posthemorrhagic anemia: Secondary | ICD-10-CM | POA: Diagnosis not present

## 2023-01-15 DIAGNOSIS — N390 Urinary tract infection, site not specified: Secondary | ICD-10-CM | POA: Diagnosis not present

## 2023-01-15 DIAGNOSIS — E1165 Type 2 diabetes mellitus with hyperglycemia: Secondary | ICD-10-CM | POA: Diagnosis not present

## 2023-01-15 DIAGNOSIS — E1122 Type 2 diabetes mellitus with diabetic chronic kidney disease: Secondary | ICD-10-CM | POA: Diagnosis not present

## 2023-01-15 DIAGNOSIS — N184 Chronic kidney disease, stage 4 (severe): Secondary | ICD-10-CM | POA: Diagnosis not present

## 2023-01-15 DIAGNOSIS — I5032 Chronic diastolic (congestive) heart failure: Secondary | ICD-10-CM | POA: Diagnosis not present

## 2023-01-15 DIAGNOSIS — B9689 Other specified bacterial agents as the cause of diseases classified elsewhere: Secondary | ICD-10-CM | POA: Diagnosis not present

## 2023-01-15 DIAGNOSIS — I130 Hypertensive heart and chronic kidney disease with heart failure and stage 1 through stage 4 chronic kidney disease, or unspecified chronic kidney disease: Secondary | ICD-10-CM | POA: Diagnosis not present

## 2023-01-16 DIAGNOSIS — S72451D Displaced supracondylar fracture without intracondylar extension of lower end of right femur, subsequent encounter for closed fracture with routine healing: Secondary | ICD-10-CM | POA: Diagnosis not present

## 2023-01-16 DIAGNOSIS — N184 Chronic kidney disease, stage 4 (severe): Secondary | ICD-10-CM | POA: Diagnosis not present

## 2023-01-16 DIAGNOSIS — D62 Acute posthemorrhagic anemia: Secondary | ICD-10-CM | POA: Diagnosis not present

## 2023-01-16 DIAGNOSIS — I130 Hypertensive heart and chronic kidney disease with heart failure and stage 1 through stage 4 chronic kidney disease, or unspecified chronic kidney disease: Secondary | ICD-10-CM | POA: Diagnosis not present

## 2023-01-16 DIAGNOSIS — B9689 Other specified bacterial agents as the cause of diseases classified elsewhere: Secondary | ICD-10-CM | POA: Diagnosis not present

## 2023-01-16 DIAGNOSIS — N390 Urinary tract infection, site not specified: Secondary | ICD-10-CM | POA: Diagnosis not present

## 2023-01-16 DIAGNOSIS — E1122 Type 2 diabetes mellitus with diabetic chronic kidney disease: Secondary | ICD-10-CM | POA: Diagnosis not present

## 2023-01-16 DIAGNOSIS — E1165 Type 2 diabetes mellitus with hyperglycemia: Secondary | ICD-10-CM | POA: Diagnosis not present

## 2023-01-16 DIAGNOSIS — I5032 Chronic diastolic (congestive) heart failure: Secondary | ICD-10-CM | POA: Diagnosis not present

## 2023-01-17 DIAGNOSIS — I130 Hypertensive heart and chronic kidney disease with heart failure and stage 1 through stage 4 chronic kidney disease, or unspecified chronic kidney disease: Secondary | ICD-10-CM | POA: Diagnosis not present

## 2023-01-17 DIAGNOSIS — E1122 Type 2 diabetes mellitus with diabetic chronic kidney disease: Secondary | ICD-10-CM | POA: Diagnosis not present

## 2023-01-17 DIAGNOSIS — B9689 Other specified bacterial agents as the cause of diseases classified elsewhere: Secondary | ICD-10-CM | POA: Diagnosis not present

## 2023-01-17 DIAGNOSIS — N184 Chronic kidney disease, stage 4 (severe): Secondary | ICD-10-CM | POA: Diagnosis not present

## 2023-01-17 DIAGNOSIS — N390 Urinary tract infection, site not specified: Secondary | ICD-10-CM | POA: Diagnosis not present

## 2023-01-17 DIAGNOSIS — E1165 Type 2 diabetes mellitus with hyperglycemia: Secondary | ICD-10-CM | POA: Diagnosis not present

## 2023-01-17 DIAGNOSIS — S72451D Displaced supracondylar fracture without intracondylar extension of lower end of right femur, subsequent encounter for closed fracture with routine healing: Secondary | ICD-10-CM | POA: Diagnosis not present

## 2023-01-17 DIAGNOSIS — D62 Acute posthemorrhagic anemia: Secondary | ICD-10-CM | POA: Diagnosis not present

## 2023-01-17 DIAGNOSIS — I5032 Chronic diastolic (congestive) heart failure: Secondary | ICD-10-CM | POA: Diagnosis not present

## 2023-01-18 DIAGNOSIS — N390 Urinary tract infection, site not specified: Secondary | ICD-10-CM | POA: Diagnosis not present

## 2023-01-18 DIAGNOSIS — B9689 Other specified bacterial agents as the cause of diseases classified elsewhere: Secondary | ICD-10-CM | POA: Diagnosis not present

## 2023-01-18 DIAGNOSIS — I5032 Chronic diastolic (congestive) heart failure: Secondary | ICD-10-CM | POA: Diagnosis not present

## 2023-01-18 DIAGNOSIS — E1165 Type 2 diabetes mellitus with hyperglycemia: Secondary | ICD-10-CM | POA: Diagnosis not present

## 2023-01-18 DIAGNOSIS — D62 Acute posthemorrhagic anemia: Secondary | ICD-10-CM | POA: Diagnosis not present

## 2023-01-18 DIAGNOSIS — I130 Hypertensive heart and chronic kidney disease with heart failure and stage 1 through stage 4 chronic kidney disease, or unspecified chronic kidney disease: Secondary | ICD-10-CM | POA: Diagnosis not present

## 2023-01-18 DIAGNOSIS — N184 Chronic kidney disease, stage 4 (severe): Secondary | ICD-10-CM | POA: Diagnosis not present

## 2023-01-18 DIAGNOSIS — S72451D Displaced supracondylar fracture without intracondylar extension of lower end of right femur, subsequent encounter for closed fracture with routine healing: Secondary | ICD-10-CM | POA: Diagnosis not present

## 2023-01-18 DIAGNOSIS — E1122 Type 2 diabetes mellitus with diabetic chronic kidney disease: Secondary | ICD-10-CM | POA: Diagnosis not present

## 2023-01-21 DIAGNOSIS — I10 Essential (primary) hypertension: Secondary | ICD-10-CM | POA: Diagnosis not present

## 2023-01-21 DIAGNOSIS — E1165 Type 2 diabetes mellitus with hyperglycemia: Secondary | ICD-10-CM | POA: Diagnosis not present

## 2023-01-21 DIAGNOSIS — Z6821 Body mass index (BMI) 21.0-21.9, adult: Secondary | ICD-10-CM | POA: Diagnosis not present

## 2023-01-21 DIAGNOSIS — Z299 Encounter for prophylactic measures, unspecified: Secondary | ICD-10-CM | POA: Diagnosis not present

## 2023-01-21 DIAGNOSIS — R350 Frequency of micturition: Secondary | ICD-10-CM | POA: Diagnosis not present

## 2023-01-21 DIAGNOSIS — N390 Urinary tract infection, site not specified: Secondary | ICD-10-CM | POA: Diagnosis not present

## 2023-01-21 DIAGNOSIS — E1151 Type 2 diabetes mellitus with diabetic peripheral angiopathy without gangrene: Secondary | ICD-10-CM | POA: Diagnosis not present

## 2023-01-22 DIAGNOSIS — S72451D Displaced supracondylar fracture without intracondylar extension of lower end of right femur, subsequent encounter for closed fracture with routine healing: Secondary | ICD-10-CM | POA: Diagnosis not present

## 2023-01-23 DIAGNOSIS — E1165 Type 2 diabetes mellitus with hyperglycemia: Secondary | ICD-10-CM | POA: Diagnosis not present

## 2023-01-23 DIAGNOSIS — D62 Acute posthemorrhagic anemia: Secondary | ICD-10-CM | POA: Diagnosis not present

## 2023-01-23 DIAGNOSIS — N390 Urinary tract infection, site not specified: Secondary | ICD-10-CM | POA: Diagnosis not present

## 2023-01-23 DIAGNOSIS — S72451D Displaced supracondylar fracture without intracondylar extension of lower end of right femur, subsequent encounter for closed fracture with routine healing: Secondary | ICD-10-CM | POA: Diagnosis not present

## 2023-01-24 DIAGNOSIS — S72451D Displaced supracondylar fracture without intracondylar extension of lower end of right femur, subsequent encounter for closed fracture with routine healing: Secondary | ICD-10-CM | POA: Diagnosis not present

## 2023-01-24 DIAGNOSIS — D62 Acute posthemorrhagic anemia: Secondary | ICD-10-CM | POA: Diagnosis not present

## 2023-01-24 DIAGNOSIS — E1165 Type 2 diabetes mellitus with hyperglycemia: Secondary | ICD-10-CM | POA: Diagnosis not present

## 2023-01-24 DIAGNOSIS — E1122 Type 2 diabetes mellitus with diabetic chronic kidney disease: Secondary | ICD-10-CM | POA: Diagnosis not present

## 2023-01-24 DIAGNOSIS — I130 Hypertensive heart and chronic kidney disease with heart failure and stage 1 through stage 4 chronic kidney disease, or unspecified chronic kidney disease: Secondary | ICD-10-CM | POA: Diagnosis not present

## 2023-01-24 DIAGNOSIS — N390 Urinary tract infection, site not specified: Secondary | ICD-10-CM | POA: Diagnosis not present

## 2023-01-24 DIAGNOSIS — I5032 Chronic diastolic (congestive) heart failure: Secondary | ICD-10-CM | POA: Diagnosis not present

## 2023-01-24 DIAGNOSIS — N184 Chronic kidney disease, stage 4 (severe): Secondary | ICD-10-CM | POA: Diagnosis not present

## 2023-01-24 DIAGNOSIS — B9689 Other specified bacterial agents as the cause of diseases classified elsewhere: Secondary | ICD-10-CM | POA: Diagnosis not present

## 2023-01-25 DIAGNOSIS — I130 Hypertensive heart and chronic kidney disease with heart failure and stage 1 through stage 4 chronic kidney disease, or unspecified chronic kidney disease: Secondary | ICD-10-CM | POA: Diagnosis not present

## 2023-01-25 DIAGNOSIS — B9689 Other specified bacterial agents as the cause of diseases classified elsewhere: Secondary | ICD-10-CM | POA: Diagnosis not present

## 2023-01-25 DIAGNOSIS — E1165 Type 2 diabetes mellitus with hyperglycemia: Secondary | ICD-10-CM | POA: Diagnosis not present

## 2023-01-25 DIAGNOSIS — D62 Acute posthemorrhagic anemia: Secondary | ICD-10-CM | POA: Diagnosis not present

## 2023-01-25 DIAGNOSIS — S72451D Displaced supracondylar fracture without intracondylar extension of lower end of right femur, subsequent encounter for closed fracture with routine healing: Secondary | ICD-10-CM | POA: Diagnosis not present

## 2023-01-25 DIAGNOSIS — N390 Urinary tract infection, site not specified: Secondary | ICD-10-CM | POA: Diagnosis not present

## 2023-01-25 DIAGNOSIS — N184 Chronic kidney disease, stage 4 (severe): Secondary | ICD-10-CM | POA: Diagnosis not present

## 2023-01-25 DIAGNOSIS — I5032 Chronic diastolic (congestive) heart failure: Secondary | ICD-10-CM | POA: Diagnosis not present

## 2023-01-25 DIAGNOSIS — E1122 Type 2 diabetes mellitus with diabetic chronic kidney disease: Secondary | ICD-10-CM | POA: Diagnosis not present

## 2023-01-28 DIAGNOSIS — N184 Chronic kidney disease, stage 4 (severe): Secondary | ICD-10-CM | POA: Diagnosis not present

## 2023-01-28 DIAGNOSIS — I5032 Chronic diastolic (congestive) heart failure: Secondary | ICD-10-CM | POA: Diagnosis not present

## 2023-01-28 DIAGNOSIS — N390 Urinary tract infection, site not specified: Secondary | ICD-10-CM | POA: Diagnosis not present

## 2023-01-28 DIAGNOSIS — E1122 Type 2 diabetes mellitus with diabetic chronic kidney disease: Secondary | ICD-10-CM | POA: Diagnosis not present

## 2023-01-28 DIAGNOSIS — I130 Hypertensive heart and chronic kidney disease with heart failure and stage 1 through stage 4 chronic kidney disease, or unspecified chronic kidney disease: Secondary | ICD-10-CM | POA: Diagnosis not present

## 2023-01-28 DIAGNOSIS — D62 Acute posthemorrhagic anemia: Secondary | ICD-10-CM | POA: Diagnosis not present

## 2023-01-28 DIAGNOSIS — B9689 Other specified bacterial agents as the cause of diseases classified elsewhere: Secondary | ICD-10-CM | POA: Diagnosis not present

## 2023-01-28 DIAGNOSIS — S72451D Displaced supracondylar fracture without intracondylar extension of lower end of right femur, subsequent encounter for closed fracture with routine healing: Secondary | ICD-10-CM | POA: Diagnosis not present

## 2023-01-28 DIAGNOSIS — E1165 Type 2 diabetes mellitus with hyperglycemia: Secondary | ICD-10-CM | POA: Diagnosis not present

## 2023-01-31 DIAGNOSIS — E1165 Type 2 diabetes mellitus with hyperglycemia: Secondary | ICD-10-CM | POA: Diagnosis not present

## 2023-01-31 DIAGNOSIS — R350 Frequency of micturition: Secondary | ICD-10-CM | POA: Diagnosis not present

## 2023-01-31 DIAGNOSIS — S72451D Displaced supracondylar fracture without intracondylar extension of lower end of right femur, subsequent encounter for closed fracture with routine healing: Secondary | ICD-10-CM | POA: Diagnosis not present

## 2023-01-31 DIAGNOSIS — J449 Chronic obstructive pulmonary disease, unspecified: Secondary | ICD-10-CM | POA: Diagnosis not present

## 2023-01-31 DIAGNOSIS — B9689 Other specified bacterial agents as the cause of diseases classified elsewhere: Secondary | ICD-10-CM | POA: Diagnosis not present

## 2023-01-31 DIAGNOSIS — E1122 Type 2 diabetes mellitus with diabetic chronic kidney disease: Secondary | ICD-10-CM | POA: Diagnosis not present

## 2023-01-31 DIAGNOSIS — R2681 Unsteadiness on feet: Secondary | ICD-10-CM | POA: Diagnosis not present

## 2023-01-31 DIAGNOSIS — I5032 Chronic diastolic (congestive) heart failure: Secondary | ICD-10-CM | POA: Diagnosis not present

## 2023-01-31 DIAGNOSIS — I509 Heart failure, unspecified: Secondary | ICD-10-CM | POA: Diagnosis not present

## 2023-01-31 DIAGNOSIS — N184 Chronic kidney disease, stage 4 (severe): Secondary | ICD-10-CM | POA: Diagnosis not present

## 2023-01-31 DIAGNOSIS — I10 Essential (primary) hypertension: Secondary | ICD-10-CM | POA: Diagnosis not present

## 2023-01-31 DIAGNOSIS — M2550 Pain in unspecified joint: Secondary | ICD-10-CM | POA: Diagnosis not present

## 2023-01-31 DIAGNOSIS — D62 Acute posthemorrhagic anemia: Secondary | ICD-10-CM | POA: Diagnosis not present

## 2023-01-31 DIAGNOSIS — R531 Weakness: Secondary | ICD-10-CM | POA: Diagnosis not present

## 2023-01-31 DIAGNOSIS — N390 Urinary tract infection, site not specified: Secondary | ICD-10-CM | POA: Diagnosis not present

## 2023-01-31 DIAGNOSIS — I130 Hypertensive heart and chronic kidney disease with heart failure and stage 1 through stage 4 chronic kidney disease, or unspecified chronic kidney disease: Secondary | ICD-10-CM | POA: Diagnosis not present

## 2023-02-01 DIAGNOSIS — S72451D Displaced supracondylar fracture without intracondylar extension of lower end of right femur, subsequent encounter for closed fracture with routine healing: Secondary | ICD-10-CM | POA: Diagnosis not present

## 2023-02-01 DIAGNOSIS — I5032 Chronic diastolic (congestive) heart failure: Secondary | ICD-10-CM | POA: Diagnosis not present

## 2023-02-01 DIAGNOSIS — E1122 Type 2 diabetes mellitus with diabetic chronic kidney disease: Secondary | ICD-10-CM | POA: Diagnosis not present

## 2023-02-01 DIAGNOSIS — B9689 Other specified bacterial agents as the cause of diseases classified elsewhere: Secondary | ICD-10-CM | POA: Diagnosis not present

## 2023-02-01 DIAGNOSIS — E1165 Type 2 diabetes mellitus with hyperglycemia: Secondary | ICD-10-CM | POA: Diagnosis not present

## 2023-02-01 DIAGNOSIS — N184 Chronic kidney disease, stage 4 (severe): Secondary | ICD-10-CM | POA: Diagnosis not present

## 2023-02-01 DIAGNOSIS — I130 Hypertensive heart and chronic kidney disease with heart failure and stage 1 through stage 4 chronic kidney disease, or unspecified chronic kidney disease: Secondary | ICD-10-CM | POA: Diagnosis not present

## 2023-02-01 DIAGNOSIS — N390 Urinary tract infection, site not specified: Secondary | ICD-10-CM | POA: Diagnosis not present

## 2023-02-01 DIAGNOSIS — D62 Acute posthemorrhagic anemia: Secondary | ICD-10-CM | POA: Diagnosis not present

## 2023-02-04 ENCOUNTER — Encounter (HOSPITAL_COMMUNITY): Payer: Self-pay | Admitting: *Deleted

## 2023-02-04 ENCOUNTER — Emergency Department (HOSPITAL_COMMUNITY): Payer: Medicare PPO

## 2023-02-04 ENCOUNTER — Inpatient Hospital Stay (HOSPITAL_COMMUNITY)
Admission: EM | Admit: 2023-02-04 | Discharge: 2023-02-11 | DRG: 638 | Disposition: A | Discharge status: Home Health | Payer: Medicare PPO | Attending: Family Medicine | Admitting: Family Medicine

## 2023-02-04 ENCOUNTER — Encounter (HOSPITAL_COMMUNITY): Payer: Self-pay | Admitting: Nephrology

## 2023-02-04 ENCOUNTER — Other Ambulatory Visit: Payer: Self-pay

## 2023-02-04 DIAGNOSIS — E1143 Type 2 diabetes mellitus with diabetic autonomic (poly)neuropathy: Secondary | ICD-10-CM | POA: Diagnosis present

## 2023-02-04 DIAGNOSIS — E1100 Type 2 diabetes mellitus with hyperosmolarity without nonketotic hyperglycemic-hyperosmolar coma (NKHHC): Secondary | ICD-10-CM | POA: Diagnosis not present

## 2023-02-04 DIAGNOSIS — K3184 Gastroparesis: Secondary | ICD-10-CM | POA: Diagnosis present

## 2023-02-04 DIAGNOSIS — R9431 Abnormal electrocardiogram [ECG] [EKG]: Secondary | ICD-10-CM | POA: Diagnosis present

## 2023-02-04 DIAGNOSIS — I2510 Atherosclerotic heart disease of native coronary artery without angina pectoris: Secondary | ICD-10-CM | POA: Diagnosis present

## 2023-02-04 DIAGNOSIS — K5090 Crohn's disease, unspecified, without complications: Secondary | ICD-10-CM | POA: Diagnosis present

## 2023-02-04 DIAGNOSIS — R000 Tachycardia, unspecified: Secondary | ICD-10-CM | POA: Diagnosis not present

## 2023-02-04 DIAGNOSIS — G40909 Epilepsy, unspecified, not intractable, without status epilepticus: Secondary | ICD-10-CM | POA: Diagnosis present

## 2023-02-04 DIAGNOSIS — M1990 Unspecified osteoarthritis, unspecified site: Secondary | ICD-10-CM | POA: Diagnosis present

## 2023-02-04 DIAGNOSIS — Z8673 Personal history of transient ischemic attack (TIA), and cerebral infarction without residual deficits: Secondary | ICD-10-CM | POA: Diagnosis not present

## 2023-02-04 DIAGNOSIS — R112 Nausea with vomiting, unspecified: Secondary | ICD-10-CM | POA: Diagnosis present

## 2023-02-04 DIAGNOSIS — E8729 Other acidosis: Secondary | ICD-10-CM | POA: Insufficient documentation

## 2023-02-04 DIAGNOSIS — E782 Mixed hyperlipidemia: Secondary | ICD-10-CM | POA: Diagnosis present

## 2023-02-04 DIAGNOSIS — D649 Anemia, unspecified: Secondary | ICD-10-CM | POA: Diagnosis present

## 2023-02-04 DIAGNOSIS — E871 Hypo-osmolality and hyponatremia: Secondary | ICD-10-CM | POA: Diagnosis not present

## 2023-02-04 DIAGNOSIS — Z888 Allergy status to other drugs, medicaments and biological substances status: Secondary | ICD-10-CM

## 2023-02-04 DIAGNOSIS — I160 Hypertensive urgency: Secondary | ICD-10-CM | POA: Diagnosis present

## 2023-02-04 DIAGNOSIS — N179 Acute kidney failure, unspecified: Secondary | ICD-10-CM | POA: Diagnosis present

## 2023-02-04 DIAGNOSIS — D631 Anemia in chronic kidney disease: Secondary | ICD-10-CM | POA: Diagnosis not present

## 2023-02-04 DIAGNOSIS — Z881 Allergy status to other antibiotic agents status: Secondary | ICD-10-CM

## 2023-02-04 DIAGNOSIS — R7989 Other specified abnormal findings of blood chemistry: Secondary | ICD-10-CM | POA: Diagnosis not present

## 2023-02-04 DIAGNOSIS — E1110 Type 2 diabetes mellitus with ketoacidosis without coma: Secondary | ICD-10-CM | POA: Diagnosis not present

## 2023-02-04 DIAGNOSIS — E1165 Type 2 diabetes mellitus with hyperglycemia: Secondary | ICD-10-CM | POA: Diagnosis not present

## 2023-02-04 DIAGNOSIS — K529 Noninfective gastroenteritis and colitis, unspecified: Secondary | ICD-10-CM | POA: Diagnosis not present

## 2023-02-04 DIAGNOSIS — K5900 Constipation, unspecified: Secondary | ICD-10-CM | POA: Diagnosis present

## 2023-02-04 DIAGNOSIS — Z7901 Long term (current) use of anticoagulants: Secondary | ICD-10-CM | POA: Diagnosis not present

## 2023-02-04 DIAGNOSIS — E860 Dehydration: Secondary | ICD-10-CM | POA: Diagnosis present

## 2023-02-04 DIAGNOSIS — Z79899 Other long term (current) drug therapy: Secondary | ICD-10-CM

## 2023-02-04 DIAGNOSIS — Z794 Long term (current) use of insulin: Secondary | ICD-10-CM

## 2023-02-04 DIAGNOSIS — N3000 Acute cystitis without hematuria: Secondary | ICD-10-CM | POA: Diagnosis not present

## 2023-02-04 DIAGNOSIS — Z882 Allergy status to sulfonamides status: Secondary | ICD-10-CM

## 2023-02-04 DIAGNOSIS — K3189 Other diseases of stomach and duodenum: Secondary | ICD-10-CM | POA: Diagnosis not present

## 2023-02-04 DIAGNOSIS — I5032 Chronic diastolic (congestive) heart failure: Secondary | ICD-10-CM | POA: Diagnosis present

## 2023-02-04 DIAGNOSIS — N189 Chronic kidney disease, unspecified: Secondary | ICD-10-CM | POA: Diagnosis not present

## 2023-02-04 DIAGNOSIS — I130 Hypertensive heart and chronic kidney disease with heart failure and stage 1 through stage 4 chronic kidney disease, or unspecified chronic kidney disease: Secondary | ICD-10-CM | POA: Diagnosis present

## 2023-02-04 DIAGNOSIS — N184 Chronic kidney disease, stage 4 (severe): Secondary | ICD-10-CM | POA: Diagnosis present

## 2023-02-04 DIAGNOSIS — I10 Essential (primary) hypertension: Secondary | ICD-10-CM | POA: Diagnosis present

## 2023-02-04 DIAGNOSIS — Z886 Allergy status to analgesic agent status: Secondary | ICD-10-CM

## 2023-02-04 DIAGNOSIS — E1122 Type 2 diabetes mellitus with diabetic chronic kidney disease: Secondary | ICD-10-CM | POA: Diagnosis present

## 2023-02-04 DIAGNOSIS — N390 Urinary tract infection, site not specified: Secondary | ICD-10-CM | POA: Diagnosis present

## 2023-02-04 DIAGNOSIS — E872 Acidosis, unspecified: Secondary | ICD-10-CM | POA: Diagnosis present

## 2023-02-04 DIAGNOSIS — Z452 Encounter for adjustment and management of vascular access device: Secondary | ICD-10-CM | POA: Diagnosis not present

## 2023-02-04 DIAGNOSIS — Z833 Family history of diabetes mellitus: Secondary | ICD-10-CM

## 2023-02-04 DIAGNOSIS — Z87440 Personal history of urinary (tract) infections: Secondary | ICD-10-CM

## 2023-02-04 DIAGNOSIS — Z789 Other specified health status: Secondary | ICD-10-CM

## 2023-02-04 DIAGNOSIS — N281 Cyst of kidney, acquired: Secondary | ICD-10-CM | POA: Diagnosis not present

## 2023-02-04 DIAGNOSIS — Z800 Family history of malignant neoplasm of digestive organs: Secondary | ICD-10-CM

## 2023-02-04 DIAGNOSIS — R109 Unspecified abdominal pain: Secondary | ICD-10-CM | POA: Diagnosis not present

## 2023-02-04 LAB — CBG MONITORING, ED
Glucose-Capillary: >600 mg/dL (ref 70–99)
Glucose-Capillary: >600 mg/dL (ref 70–99)
Glucose-Capillary: >600 mg/dL (ref 70–99)
Glucose-Capillary: >600 mg/dL (ref 70–99)
Glucose-Capillary: >600 mg/dL (ref 70–99)
Glucose-Capillary: >600 mg/dL (ref 70–99)

## 2023-02-04 LAB — COMPREHENSIVE METABOLIC PANEL
ALT: 11 U/L (ref 0–44)
AST: 17 U/L (ref 15–41)
Albumin: 3.1 g/dL — ABNORMAL LOW (ref 3.5–5.0)
Alkaline Phosphatase: 144 U/L — ABNORMAL HIGH (ref 38–126)
Anion gap: 17 — ABNORMAL HIGH (ref 5–15)
BUN: 67 mg/dL — ABNORMAL HIGH (ref 8–23)
CO2: 17 mmol/L — ABNORMAL LOW (ref 22–32)
Calcium: 9.2 mg/dL (ref 8.9–10.3)
Chloride: 88 mmol/L — ABNORMAL LOW (ref 98–111)
Creatinine, Ser: 3.19 mg/dL — ABNORMAL HIGH (ref 0.44–1.00)
GFR, Estimated: 15 mL/min — ABNORMAL LOW (ref >60)
Glucose, Bld: 889 mg/dL (ref 70–99)
Potassium: 4.6 mmol/L (ref 3.5–5.1)
Sodium: 122 mmol/L — ABNORMAL LOW (ref 135–145)
Total Bilirubin: 1.3 mg/dL — ABNORMAL HIGH (ref 0.3–1.2)
Total Protein: 6.7 g/dL (ref 6.5–8.1)

## 2023-02-04 LAB — BLOOD GAS, VENOUS
Acid-base deficit: 7.1 mmol/L — ABNORMAL HIGH (ref 0.0–2.0)
Bicarbonate: 20.5 mmol/L (ref 20.0–28.0)
Drawn by: 4237
O2 Saturation: 52 %
Patient temperature: 37.1
pCO2, Ven: 49 mmHg (ref 44–60)
pH, Ven: 7.23 — ABNORMAL LOW (ref 7.25–7.43)
pO2, Ven: 31 mmHg — CL (ref 32–45)

## 2023-02-04 LAB — CBC
HCT: 34.4 % — ABNORMAL LOW (ref 36.0–46.0)
Hemoglobin: 11.2 g/dL — ABNORMAL LOW (ref 12.0–15.0)
MCH: 30.9 pg (ref 26.0–34.0)
MCHC: 32.6 g/dL (ref 30.0–36.0)
MCV: 94.8 fL (ref 80.0–100.0)
Platelets: 285 10*3/uL (ref 150–400)
RBC: 3.63 MIL/uL — ABNORMAL LOW (ref 3.87–5.11)
RDW: 14.7 % (ref 11.5–15.5)
WBC: 16.8 10*3/uL — ABNORMAL HIGH (ref 4.0–10.5)
nRBC: 0 % (ref 0.0–0.2)

## 2023-02-04 LAB — MAGNESIUM: Magnesium: 1.7 mg/dL (ref 1.7–2.4)

## 2023-02-04 LAB — LIPASE, BLOOD: Lipase: 24 U/L (ref 11–51)

## 2023-02-04 LAB — TROPONIN I (HIGH SENSITIVITY)
Troponin I (High Sensitivity): 14 ng/L (ref <18)
Troponin I (High Sensitivity): 13 ng/L (ref <18)

## 2023-02-04 LAB — BETA-HYDROXYBUTYRIC ACID: Beta-Hydroxybutyric Acid: 0.62 mmol/L — ABNORMAL HIGH (ref 0.05–0.27)

## 2023-02-04 MED ORDER — FENTANYL CITRATE PF 50 MCG/ML IJ SOSY
50.0000 ug | PREFILLED_SYRINGE | Freq: Once | INTRAMUSCULAR | Status: AC
  Administered 2023-02-04: 50 ug via INTRAVENOUS
  Filled 2023-02-04: qty 1

## 2023-02-04 MED ORDER — DEXTROSE 50 % IV SOLN: 0.0000 mL | INTRAVENOUS | Status: DC | PRN

## 2023-02-04 MED ORDER — POTASSIUM CHLORIDE 10 MEQ/100ML IV SOLN
10.0000 meq | INTRAVENOUS | Status: AC
  Administered 2023-02-04 (×2): 10 meq via INTRAVENOUS
  Filled 2023-02-04 (×2): qty 100

## 2023-02-04 MED ORDER — LACTATED RINGERS IV BOLUS
2000.0000 mL | Freq: Once | INTRAVENOUS | Status: AC
  Administered 2023-02-04: 2000 mL via INTRAVENOUS

## 2023-02-04 MED ORDER — INSULIN REGULAR(HUMAN) IN NACL 100-0.9 UT/100ML-% IV SOLN
INTRAVENOUS | Status: AC
  Administered 2023-02-04: 6.5 [IU]/h via INTRAVENOUS
  Administered 2023-02-05: 7 [IU]/h via INTRAVENOUS
  Filled 2023-02-04 (×2): qty 100

## 2023-02-04 MED ORDER — DEXTROSE IN LACTATED RINGERS 5 % IV SOLN: INTRAVENOUS | Status: DC

## 2023-02-04 MED ORDER — METOCLOPRAMIDE HCL 5 MG/ML IJ SOLN
10.0000 mg | Freq: Once | INTRAMUSCULAR | Status: AC
  Administered 2023-02-04: 10 mg via INTRAVENOUS
  Filled 2023-02-04: qty 2

## 2023-02-04 MED ORDER — SODIUM CHLORIDE 0.9 % IV SOLN: INTRAVENOUS | Status: DC

## 2023-02-04 NOTE — ED Notes (Signed)
Pt assisted to the restroom. Moderate assist needed.

## 2023-02-04 NOTE — ED Triage Notes (Signed)
Pt c/o vomiting x 3 days with headache

## 2023-02-04 NOTE — ED Provider Notes (Signed)
[place] EMERGENCY DEPARTMENT AT [facility] Provider Note   CSN: [id number] Arrival date & time: 02/04/23  1351     History {Add pertinent medical, surgical, social history, OB history to HPI:1} Chief Complaint  Patient presents with   Emesis    Heather [name] is a 72 y.o. female.   Emesis Associated symptoms: abdominal pain   Patient presents for emesis for the past 3 days.  Medical history includes CHF, anemia, anxiety, CKD, Crohn's disease, DM, neuropathy, HTN, GERD, gout, IBS, CVA.  She reports frequent emesis and p.o. intolerance for the past 3 days.  She does have an antiemetic at home but does not recall the name.  In addition to her emesis, she has had periumbilical abdominal pain, dysuria, left CVA pain and upper chest pain.  Husband reports that she was recently treated for UTI and did complete course of antibiotics within the past week.     Home Medications Prior to Admission medications   Medication Sig Start Date End Date Taking? Authorizing Provider  acetaminophen (TYLENOL) 325 MG tablet Take 2 tablets (650 mg total) by mouth every 4 (four) hours as needed for mild pain, moderate pain, fever or headache. 11/14/22   Laural BenesJohnson, Clanford L, MD  allopurinol (ZYLOPRIM) 100 MG tablet Take 100 mg by mouth daily. 03/20/21   [provider]  apixaban (ELIQUIS) 2.5 MG TABS tablet Take 1 tablet (2.5 mg total) by mouth 2 (two) times daily. 12/17/22 01/16/23  West BaliMcClung, Sarah A, PA-C  atorvastatin (LIPITOR) 80 MG tablet Take 1 tablet (80 mg total) by mouth daily. Restart on 06/29/22 06/22/22   Catarina Hartshornat, David, MD  diphenhydrAMINE (BENADRYL) 2 % cream Apply topically 3 (three) times daily as needed for itching. Patient not taking: Reported on 12/11/2022 12/01/21   Cleora FleetJohnson, Clanford L, MD  gabapentin (NEURONTIN) 300 MG capsule Take 1 capsule by mouth at bedtime.    [provider]  hydrALAZINE (APRESOLINE) 50 MG tablet Take 1 tablet (50 mg total) by mouth every 8  (eight) hours. 10/21/22 12/11/22  Sherryll BurgerShah, Pratik D, DO  insulin aspart (NOVOLOG) 100 UNIT/ML FlexPen Inject 5 Units into the skin 3 (three) times daily with meals. Give only if eats 50% or more of meal. Patient taking differently: Inject 10 Units into the skin daily. Give only if eats 50% or more of meal. 11/14/22   Johnson, Clanford L, MD  insulin glargine-yfgn (SEMGLEE) 100 UNIT/ML Pen INJECT 15 UNITS UNDER THE SKIN ONCE DAILY    [provider]  levETIRAcetam (KEPPRA) 500 MG tablet Take 500 mg by mouth 2 (two) times daily.    [provider]  lipase/protease/amylase (CREON) 36000 UNITS CPEP capsule Take 36,000 Units by mouth 3 (three) times daily with meals.    [provider]  methocarbamol (ROBAXIN) 750 MG tablet Take 1 tablet (750 mg total) by mouth every 6 (six) hours as needed for muscle spasms. 12/17/22   West BaliMcClung, Sarah A, PA-C  metoprolol succinate (TOPROL-XL) 25 MG 24 hr tablet Take 1.5 tablets (37.5 mg total) by mouth daily. 11/14/22 12/14/22  Johnson, Clanford L, MD  naloxone [facility](NARCAN) nasal spray 4 mg/0.1 mL Place 1 spray into the nose once.    [provider]  oxyCODONE 10 MG TABS Take 0.5-1 tablets (5-10 mg total) by mouth every 4 (four) hours as needed for severe pain or moderate pain (5 mg pain scale 4-6, 10 mg pain scale 7-9). 12/17/22   West BaliMcClung, Sarah A, PA-C  polyethylene glycol (MIRALAX / Ethelene HalGLYCOLAX)  17 g packet Take 17 g by mouth daily. Patient not taking: Reported on 12/11/2022 11/15/22   Cleora Fleet, MD  venlafaxine XR (EFFEXOR-XR) 150 MG 24 hr capsule Take 150 mg by mouth daily. 07/11/22   [provider]      Allergies    Cozaar [losartan], Quinolones, Cymbalta [duloxetine hcl], Sulfa antibiotics, Synalar [fluocinolone], Cipro [ciprofloxacin hcl], Diovan [valsartan], Glucophage [metformin], Ketalar [ketamine], Norco [hydrocodone-acetaminophen], Norvasc [amlodipine besylate], Nsaids, Tape, and Zestril [lisinopril]    Review of Systems    Review of Systems  Constitutional:  Positive for activity change, appetite change and fatigue.  Cardiovascular:  Positive for chest pain.  Gastrointestinal:  Positive for abdominal pain, nausea and vomiting.  Genitourinary:  Positive for dysuria.  Musculoskeletal:  Positive for back pain.  Neurological:  Positive for weakness (Generalized).    Physical Exam Updated Vital Signs BP (!) 91/52 (BP Location: Left Arm)   Pulse 96   Temp (!) 97.5 F (36.4 C)   Resp 18   Ht 5\' 4"  (1.626 m)   Wt 49.9 kg   LMP  (LMP Unknown)   SpO2 100%   BMI 18.88 kg/m  Physical Exam Vitals and nursing note reviewed.  Constitutional:      General: She is not in acute distress.    Appearance: She is well-developed. She is ill-appearing. She is not toxic-appearing or diaphoretic.  HENT:     Head: Normocephalic and atraumatic.     Right Ear: External ear normal.     Left Ear: External ear normal.     Nose: Nose normal.     Mouth/Throat:     Mouth: Mucous membranes are dry.  Eyes:     Extraocular Movements: Extraocular movements intact.     Conjunctiva/sclera: Conjunctivae normal.  Cardiovascular:     Rate and Rhythm: Normal rate and regular rhythm.     Heart sounds: No murmur heard. Pulmonary:     Effort: Pulmonary effort is normal. No respiratory distress.  Abdominal:     General: There is no distension.     Palpations: Abdomen is soft.     Tenderness: There is abdominal tenderness. There is left CVA tenderness. There is no right CVA tenderness, guarding or rebound.  Musculoskeletal:        General: No swelling or deformity. Normal range of motion.     Cervical back: Normal range of motion and neck supple.  Skin:    General: Skin is warm and dry.     Coloration: Skin is pale. Skin is not jaundiced.  Neurological:     General: No focal deficit present.     Mental Status: She is alert and oriented to person, place, and time.     Cranial Nerves: No cranial nerve deficit.     Sensory: No  sensory deficit.     Motor: No weakness.     Coordination: Coordination normal.  Psychiatric:        Mood and Affect: Mood normal.        Behavior: Behavior normal.     ED Results / Procedures / Treatments   Labs (all labs ordered are listed, but only abnormal results are displayed) Labs Reviewed  COMPREHENSIVE METABOLIC PANEL - Abnormal; Notable for the following components:      Result Value   Sodium 122 (*)    Chloride 88 (*)    CO2 17 (*)    Glucose, Bld 889 (*)    BUN 67 (*)    Creatinine, Ser 3.19 (*)  Albumin 3.1 (*)    Alkaline Phosphatase 144 (*)    Total Bilirubin 1.3 (*)    GFR, Estimated 15 (*)    Anion gap 17 (*)    All other components within normal limits  CBC - Abnormal; Notable for the following components:   WBC 16.8 (*)    RBC 3.63 (*)    Hemoglobin 11.2 (*)    HCT 34.4 (*)    All other components within normal limits  LIPASE, BLOOD  URINALYSIS, ROUTINE W REFLEX MICROSCOPIC    EKG None  Radiology No results found.  Procedures Procedures  {Document cardiac monitor, telemetry assessment procedure when appropriate:1}  Medications Ordered in ED Medications - No data to display  ED Course/ Medical Decision Making/ A&P   {   Click here for ABCD2, HEART and other calculatorsREFRESH Note before signing :1}                          Medical Decision Making Amount and/or Complexity of Data Reviewed Labs: ordered.   This patient presents to the ED for concern of ***, this involves an extensive number of treatment options, and is a complaint that carries with it a high risk of complications and morbidity.  The differential diagnosis includes ***   Co morbidities that complicate the patient evaluation  ***   Additional history obtained:  Additional history obtained from *** External records from outside source obtained and reviewed including ***   Lab Tests:  I Ordered, and personally interpreted labs.  The pertinent results include:   ***   Imaging Studies ordered:  I ordered imaging studies including ***  I independently visualized and interpreted imaging which showed *** I agree with the radiologist interpretation   Cardiac Monitoring: / EKG:  The patient was maintained on a cardiac monitor.  I personally viewed and interpreted the cardiac monitored which showed an underlying rhythm of: ***   Consultations Obtained:  I requested consultation with the ***,  and discussed lab and imaging findings as well as pertinent plan - they recommend: ***   Problem List / ED Course / Critical interventions / Medication management  Patient presents for nausea, vomiting, dysuria, mid abdominal pain, and left lower back pain.  Vital signs on arrival are notable for hypotension.  Initial blood glucose was severely elevated at 889.  Lab work also notable for AKI and leukocytosis.  IV fluids were initiated.  On exam, patient has generalized abdominal tenderness and left CVA tenderness.  She endorses recent dysuria.  Lab work concerning for DKA/HHS.  This may be in the setting of pyelonephritis given exam findings.  Reglan and fentanyl were ordered for symptomatic relief. I ordered medication including ***  for ***  Reevaluation of the patient after these medicines showed that the patient {resolved/improved/worsened:23923::"improved"} I have reviewed the patients home medicines and have made adjustments as needed   Social Determinants of Health:  ***   Test / Admission - Considered:  ***   {Document critical care time when appropriate:1} {Document review of labs and clinical decision tools ie heart score, Chads2Vasc2 etc:1}  {Document your independent review of radiology images, and any outside records:1} {Document your discussion with family members, caretakers, and with consultants:1} {Document social determinants of health affecting pt's care:1} {Document your decision making why or why not admission, treatments were  needed:1} Final Clinical Impression(s) / ED Diagnoses Final diagnoses:  None    Rx / DC Orders ED Discharge Orders  None       

## 2023-02-05 ENCOUNTER — Observation Stay (HOSPITAL_COMMUNITY): Payer: Medicare PPO

## 2023-02-05 ENCOUNTER — Other Ambulatory Visit: Payer: Self-pay

## 2023-02-05 DIAGNOSIS — R7989 Other specified abnormal findings of blood chemistry: Secondary | ICD-10-CM

## 2023-02-05 DIAGNOSIS — E1143 Type 2 diabetes mellitus with diabetic autonomic (poly)neuropathy: Secondary | ICD-10-CM | POA: Diagnosis present

## 2023-02-05 DIAGNOSIS — N3000 Acute cystitis without hematuria: Secondary | ICD-10-CM

## 2023-02-05 DIAGNOSIS — Z452 Encounter for adjustment and management of vascular access device: Secondary | ICD-10-CM | POA: Diagnosis not present

## 2023-02-05 DIAGNOSIS — E1110 Type 2 diabetes mellitus with ketoacidosis without coma: Secondary | ICD-10-CM

## 2023-02-05 DIAGNOSIS — N184 Chronic kidney disease, stage 4 (severe): Secondary | ICD-10-CM | POA: Diagnosis present

## 2023-02-05 DIAGNOSIS — N179 Acute kidney failure, unspecified: Secondary | ICD-10-CM | POA: Diagnosis present

## 2023-02-05 DIAGNOSIS — Z7901 Long term (current) use of anticoagulants: Secondary | ICD-10-CM | POA: Diagnosis not present

## 2023-02-05 DIAGNOSIS — E1165 Type 2 diabetes mellitus with hyperglycemia: Secondary | ICD-10-CM

## 2023-02-05 DIAGNOSIS — E1122 Type 2 diabetes mellitus with diabetic chronic kidney disease: Secondary | ICD-10-CM | POA: Diagnosis present

## 2023-02-05 DIAGNOSIS — E8729 Other acidosis: Secondary | ICD-10-CM | POA: Insufficient documentation

## 2023-02-05 DIAGNOSIS — I5032 Chronic diastolic (congestive) heart failure: Secondary | ICD-10-CM | POA: Diagnosis not present

## 2023-02-05 DIAGNOSIS — I10 Essential (primary) hypertension: Secondary | ICD-10-CM | POA: Diagnosis not present

## 2023-02-05 DIAGNOSIS — Z789 Other specified health status: Secondary | ICD-10-CM | POA: Diagnosis not present

## 2023-02-05 DIAGNOSIS — I2510 Atherosclerotic heart disease of native coronary artery without angina pectoris: Secondary | ICD-10-CM | POA: Diagnosis present

## 2023-02-05 DIAGNOSIS — Z79899 Other long term (current) drug therapy: Secondary | ICD-10-CM | POA: Diagnosis not present

## 2023-02-05 DIAGNOSIS — E860 Dehydration: Secondary | ICD-10-CM | POA: Diagnosis present

## 2023-02-05 DIAGNOSIS — R9431 Abnormal electrocardiogram [ECG] [EKG]: Secondary | ICD-10-CM | POA: Diagnosis not present

## 2023-02-05 DIAGNOSIS — G40909 Epilepsy, unspecified, not intractable, without status epilepticus: Secondary | ICD-10-CM | POA: Diagnosis present

## 2023-02-05 DIAGNOSIS — Z8673 Personal history of transient ischemic attack (TIA), and cerebral infarction without residual deficits: Secondary | ICD-10-CM | POA: Diagnosis not present

## 2023-02-05 DIAGNOSIS — E872 Acidosis, unspecified: Secondary | ICD-10-CM | POA: Diagnosis present

## 2023-02-05 DIAGNOSIS — I160 Hypertensive urgency: Secondary | ICD-10-CM | POA: Diagnosis present

## 2023-02-05 DIAGNOSIS — Z794 Long term (current) use of insulin: Secondary | ICD-10-CM

## 2023-02-05 DIAGNOSIS — K3184 Gastroparesis: Secondary | ICD-10-CM | POA: Diagnosis present

## 2023-02-05 DIAGNOSIS — N390 Urinary tract infection, site not specified: Secondary | ICD-10-CM | POA: Diagnosis present

## 2023-02-05 DIAGNOSIS — D631 Anemia in chronic kidney disease: Secondary | ICD-10-CM | POA: Diagnosis present

## 2023-02-05 DIAGNOSIS — E782 Mixed hyperlipidemia: Secondary | ICD-10-CM | POA: Diagnosis present

## 2023-02-05 DIAGNOSIS — E1100 Type 2 diabetes mellitus with hyperosmolarity without nonketotic hyperglycemic-hyperosmolar coma (NKHHC): Secondary | ICD-10-CM | POA: Diagnosis present

## 2023-02-05 DIAGNOSIS — N189 Chronic kidney disease, unspecified: Secondary | ICD-10-CM | POA: Diagnosis not present

## 2023-02-05 DIAGNOSIS — M1990 Unspecified osteoarthritis, unspecified site: Secondary | ICD-10-CM | POA: Diagnosis present

## 2023-02-05 DIAGNOSIS — K5090 Crohn's disease, unspecified, without complications: Secondary | ICD-10-CM | POA: Diagnosis present

## 2023-02-05 DIAGNOSIS — K5900 Constipation, unspecified: Secondary | ICD-10-CM | POA: Diagnosis present

## 2023-02-05 DIAGNOSIS — I130 Hypertensive heart and chronic kidney disease with heart failure and stage 1 through stage 4 chronic kidney disease, or unspecified chronic kidney disease: Secondary | ICD-10-CM | POA: Diagnosis present

## 2023-02-05 DIAGNOSIS — E871 Hypo-osmolality and hyponatremia: Secondary | ICD-10-CM | POA: Diagnosis present

## 2023-02-05 DIAGNOSIS — R112 Nausea with vomiting, unspecified: Secondary | ICD-10-CM | POA: Diagnosis present

## 2023-02-05 DIAGNOSIS — K529 Noninfective gastroenteritis and colitis, unspecified: Secondary | ICD-10-CM | POA: Diagnosis not present

## 2023-02-05 LAB — BASIC METABOLIC PANEL
Anion gap: 12 (ref 5–15)
Anion gap: 9 (ref 5–15)
Anion gap: 10 (ref 5–15)
Anion gap: 8 (ref 5–15)
BUN: 68 mg/dL — ABNORMAL HIGH (ref 8–23)
BUN: 69 mg/dL — ABNORMAL HIGH (ref 8–23)
BUN: 71 mg/dL — ABNORMAL HIGH (ref 8–23)
BUN: 70 mg/dL — ABNORMAL HIGH (ref 8–23)
CO2: 15 mmol/L — ABNORMAL LOW (ref 22–32)
CO2: 17 mmol/L — ABNORMAL LOW (ref 22–32)
CO2: 17 mmol/L — ABNORMAL LOW (ref 22–32)
CO2: 19 mmol/L — ABNORMAL LOW (ref 22–32)
Calcium: 8.6 mg/dL — ABNORMAL LOW (ref 8.9–10.3)
Calcium: 8.2 mg/dL — ABNORMAL LOW (ref 8.9–10.3)
Calcium: 8.4 mg/dL — ABNORMAL LOW (ref 8.9–10.3)
Calcium: 8.2 mg/dL — ABNORMAL LOW (ref 8.9–10.3)
Chloride: 100 mmol/L (ref 98–111)
Chloride: 101 mmol/L (ref 98–111)
Chloride: 100 mmol/L (ref 98–111)
Chloride: 101 mmol/L (ref 98–111)
Creatinine, Ser: 3.08 mg/dL — ABNORMAL HIGH (ref 0.44–1.00)
Creatinine, Ser: 2.81 mg/dL — ABNORMAL HIGH (ref 0.44–1.00)
Creatinine, Ser: 2.79 mg/dL — ABNORMAL HIGH (ref 0.44–1.00)
Creatinine, Ser: 2.77 mg/dL — ABNORMAL HIGH (ref 0.44–1.00)
GFR, Estimated: 16 mL/min — ABNORMAL LOW (ref >60)
GFR, Estimated: 18 mL/min — ABNORMAL LOW (ref >60)
GFR, Estimated: 18 mL/min — ABNORMAL LOW (ref >60)
GFR, Estimated: 18 mL/min — ABNORMAL LOW (ref >60)
Glucose, Bld: 261 mg/dL — ABNORMAL HIGH (ref 70–99)
Glucose, Bld: 156 mg/dL — ABNORMAL HIGH (ref 70–99)
Glucose, Bld: 189 mg/dL — ABNORMAL HIGH (ref 70–99)
Glucose, Bld: 169 mg/dL — ABNORMAL HIGH (ref 70–99)
Potassium: 4.1 mmol/L (ref 3.5–5.1)
Potassium: 3.7 mmol/L (ref 3.5–5.1)
Potassium: 3.7 mmol/L (ref 3.5–5.1)
Potassium: 3.5 mmol/L (ref 3.5–5.1)
Sodium: 127 mmol/L — ABNORMAL LOW (ref 135–145)
Sodium: 127 mmol/L — ABNORMAL LOW (ref 135–145)
Sodium: 127 mmol/L — ABNORMAL LOW (ref 135–145)
Sodium: 128 mmol/L — ABNORMAL LOW (ref 135–145)

## 2023-02-05 LAB — URINALYSIS, ROUTINE W REFLEX MICROSCOPIC
Bilirubin Urine: NEGATIVE
Glucose, UA: >=500 mg/dL — AB
Ketones, ur: NEGATIVE mg/dL
Nitrite: NEGATIVE
Protein, ur: 100 mg/dL — AB
RBC / HPF: >50 RBC/hpf (ref 0–5)
Specific Gravity, Urine: 1.014 (ref 1.005–1.030)
WBC, UA: >50 WBC/hpf (ref 0–5)
pH: 5 (ref 5.0–8.0)

## 2023-02-05 LAB — CBG MONITORING, ED
Glucose-Capillary: 114 mg/dL — ABNORMAL HIGH (ref 70–99)
Glucose-Capillary: 145 mg/dL — ABNORMAL HIGH (ref 70–99)
Glucose-Capillary: 165 mg/dL — ABNORMAL HIGH (ref 70–99)
Glucose-Capillary: 165 mg/dL — ABNORMAL HIGH (ref 70–99)
Glucose-Capillary: 167 mg/dL — ABNORMAL HIGH (ref 70–99)
Glucose-Capillary: 173 mg/dL — ABNORMAL HIGH (ref 70–99)
Glucose-Capillary: 176 mg/dL — ABNORMAL HIGH (ref 70–99)
Glucose-Capillary: 178 mg/dL — ABNORMAL HIGH (ref 70–99)
Glucose-Capillary: 179 mg/dL — ABNORMAL HIGH (ref 70–99)
Glucose-Capillary: 215 mg/dL — ABNORMAL HIGH (ref 70–99)
Glucose-Capillary: 258 mg/dL — ABNORMAL HIGH (ref 70–99)
Glucose-Capillary: 294 mg/dL — ABNORMAL HIGH (ref 70–99)
Glucose-Capillary: 309 mg/dL — ABNORMAL HIGH (ref 70–99)
Glucose-Capillary: 419 mg/dL — ABNORMAL HIGH (ref 70–99)
Glucose-Capillary: 443 mg/dL — ABNORMAL HIGH (ref 70–99)
Glucose-Capillary: 446 mg/dL — ABNORMAL HIGH (ref 70–99)
Glucose-Capillary: 475 mg/dL — ABNORMAL HIGH (ref 70–99)
Glucose-Capillary: 477 mg/dL — ABNORMAL HIGH (ref 70–99)
Glucose-Capillary: 539 mg/dL (ref 70–99)
Glucose-Capillary: 542 mg/dL (ref 70–99)
Glucose-Capillary: 561 mg/dL (ref 70–99)
Glucose-Capillary: >600 mg/dL (ref 70–99)

## 2023-02-05 LAB — BETA-HYDROXYBUTYRIC ACID
Beta-Hydroxybutyric Acid: 0.11 mmol/L (ref 0.05–0.27)
Beta-Hydroxybutyric Acid: 0.08 mmol/L (ref 0.05–0.27)

## 2023-02-05 LAB — OSMOLALITY: Osmolality: 341 mOsm/kg (ref 275–295)

## 2023-02-05 MED ORDER — HYDROMORPHONE HCL 1 MG/ML IJ SOLN: 0.5000 mg | INTRAMUSCULAR | Status: DC | PRN

## 2023-02-05 MED ORDER — ONDANSETRON HCL 4 MG/2ML IJ SOLN: 4.0000 mg | Freq: Four times a day (QID) | INTRAMUSCULAR | Status: DC | PRN

## 2023-02-05 MED ORDER — HEPARIN SODIUM (PORCINE) 5000 UNIT/ML IJ SOLN
5000.0000 [IU] | Freq: Three times a day (TID) | INTRAMUSCULAR | Status: DC
  Administered 2023-02-05 – 2023-02-10 (×15): 5000 [IU] via SUBCUTANEOUS
  Filled 2023-02-05 (×16): qty 1

## 2023-02-05 MED ORDER — FENTANYL CITRATE PF 50 MCG/ML IJ SOSY
25.0000 ug | PREFILLED_SYRINGE | INTRAMUSCULAR | Status: DC | PRN
  Administered 2023-02-05 – 2023-02-07 (×8): 25 ug via INTRAVENOUS
  Filled 2023-02-05 (×8): qty 1

## 2023-02-05 MED ORDER — INSULIN ASPART 100 UNIT/ML IJ SOLN
0.0000 [IU] | INTRAMUSCULAR | Status: DC
  Administered 2023-02-05 – 2023-02-06 (×3): 2 [IU] via SUBCUTANEOUS
  Administered 2023-02-06: 1 [IU] via SUBCUTANEOUS
  Administered 2023-02-06: 2 [IU] via SUBCUTANEOUS
  Administered 2023-02-07: 1 [IU] via SUBCUTANEOUS
  Administered 2023-02-07: 2 [IU] via SUBCUTANEOUS
  Administered 2023-02-07 (×2): 1 [IU] via SUBCUTANEOUS
  Administered 2023-02-07: 3 [IU] via SUBCUTANEOUS
  Administered 2023-02-08 (×2): 1 [IU] via SUBCUTANEOUS
  Administered 2023-02-08: 2 [IU] via SUBCUTANEOUS
  Administered 2023-02-09: 3 [IU] via SUBCUTANEOUS
  Administered 2023-02-09: 1 [IU] via SUBCUTANEOUS
  Administered 2023-02-09 (×2): 2 [IU] via SUBCUTANEOUS
  Administered 2023-02-10: 1 [IU] via SUBCUTANEOUS
  Administered 2023-02-10: 2 [IU] via SUBCUTANEOUS
  Filled 2023-02-05: qty 1

## 2023-02-05 MED ORDER — INSULIN ASPART 100 UNIT/ML IJ SOLN: 0.0000 [IU] | INTRAMUSCULAR | Status: DC

## 2023-02-05 MED ORDER — SODIUM CHLORIDE 0.9 % IV SOLN
1.0000 g | INTRAVENOUS | Status: DC
  Administered 2023-02-05: 1 g via INTRAVENOUS
  Filled 2023-02-05: qty 10

## 2023-02-05 MED ORDER — SODIUM CHLORIDE 0.9 % IV SOLN
1.0000 g | Freq: Once | INTRAVENOUS | Status: AC
  Administered 2023-02-05: 1 g via INTRAVENOUS
  Filled 2023-02-05: qty 10

## 2023-02-05 MED ORDER — ONDANSETRON HCL 4 MG PO TABS: 4.0000 mg | ORAL_TABLET | Freq: Four times a day (QID) | ORAL | Status: DC | PRN

## 2023-02-05 MED ORDER — INSULIN GLARGINE-YFGN 100 UNIT/ML ~~LOC~~ SOLN
7.0000 [IU] | Freq: Every day | SUBCUTANEOUS | Status: DC
  Administered 2023-02-05 – 2023-02-11 (×7): 7 [IU] via SUBCUTANEOUS
  Filled 2023-02-05 (×8): qty 0.07

## 2023-02-05 MED ORDER — PROCHLORPERAZINE EDISYLATE 10 MG/2ML IJ SOLN
10.0000 mg | Freq: Four times a day (QID) | INTRAMUSCULAR | Status: DC | PRN
  Administered 2023-02-06 – 2023-02-07 (×4): 10 mg via INTRAVENOUS
  Filled 2023-02-05 (×4): qty 2

## 2023-02-05 MED ORDER — HYDRALAZINE HCL 20 MG/ML IJ SOLN
10.0000 mg | Freq: Four times a day (QID) | INTRAMUSCULAR | Status: DC | PRN
  Administered 2023-02-05 (×2): 10 mg via INTRAVENOUS
  Filled 2023-02-05 (×2): qty 1

## 2023-02-05 MED ORDER — METOCLOPRAMIDE HCL 5 MG/ML IJ SOLN
10.0000 mg | Freq: Four times a day (QID) | INTRAMUSCULAR | Status: DC | PRN
  Administered 2023-02-05 – 2023-02-07 (×5): 10 mg via INTRAVENOUS
  Filled 2023-02-05 (×6): qty 2

## 2023-02-05 MED ORDER — FLUCONAZOLE 150 MG PO TABS
150.0000 mg | ORAL_TABLET | Freq: Once | ORAL | Status: AC
  Administered 2023-02-05: 150 mg via ORAL
  Filled 2023-02-05: qty 1

## 2023-02-05 NOTE — ED Notes (Signed)
PICC team aware pt needs placement and to call back with ETA.

## 2023-02-05 NOTE — ED Notes (Signed)
Diet ordered. MD stated to wait til pt eats at least 50% of meal before transitioning off of insulin drip.

## 2023-02-05 NOTE — ED Notes (Signed)
MD paged regarding pts standing orders. Pts last CMP was yesterday at 17:11.

## 2023-02-05 NOTE — Procedures (Addendum)
Procedure Note  02/05/23    Preoperative Diagnosis: Poor access, Diabetic ketoacidosis, acute on chronic kidney disease stage IV, hyperosmolar state   Postoperative Diagnosis: Same   Procedure(s) Performed: Central Line placement, right Internal Jugular   Surgeon: Leatrice Jewels. Henreitta Leber, MD   Assistants: None   Anesthesia: 1% lidocaine    Complications: None    Indications: Ms. Depietro is a 72 y.o. with DKA, CKD who needs access to get insulin and labwork. This was done at the request of Dr. Sherryll Burger. I discussed the risk and benefits of placement of the central line with her husband, including but not limited to bleeding, infection, and risk of pneumothorax. He has given consent for the procedure.    Procedure: The patient placed supine. The right chest and neck was prepped and draped in the usual sterile fashion.  Wearing full gown and gloves, I performed the procedure.  One percent lidocaine was used for local anesthesia. An ultrasound was utilized to assess the jugular vein.  The needle with syringe was advanced into the  vein with dark venous return, and a wire was placed using the Seldinger technique without difficulty.  Ectopia was not noted.  The skin was knicked and a dilator was placed, and the three lumen catheter was placed over the wire with continued control of the wire.  There was good draw back of blood from all three lumens and each flushed easily with saline.  The catheter was secured in 4 points with 2-0 silk and a biopatch and dressing was placed.     The patient tolerated the procedure well, and the CXR was ordered to confirm position of the central line.   Algis Greenhouse, MD [facility] [address] [place], [place] [address] [phone] (office)

## 2023-02-05 NOTE — ED Notes (Signed)
Delay in starting D5 in LR due to fluids not available. Materials called and bringing fluids.

## 2023-02-05 NOTE — H&P (Addendum)
History and Physical    Patient: Heather [name] WJX:[id number] DOB: 03-Sep-1951 DOA: 02/04/2023 DOS: the patient was seen and examined on 02/05/2023 PCP: [name], MD  Patient coming from: Home  Chief Complaint:  Chief Complaint  Patient presents with   Emesis   HPI: Heather [name] is a 72 y.o. female with medical history significant of hypertension, T2DM, diastolic CHF, CKD stage IV who presents to the emergency department due to 3-day onset of nonbloody vomiting, abdominal pain (periumbilical) and poor oral intake.  Patient also complained of left flank pain and painful urination, she states that she was recently treated for UTI and that she just completed an antibiotic course within the past week.  ED Course:  In the emergency department, temperature on arrival was 96.21F, respiratory rate was 20/min, pulse 96 bpm, BP 91/52, O2 sat 100% on room air.  Workup in the ED showed WBC of 16.8, hemoglobin 11.2, hematocrit 34.4, MCV 94.8, platelets 25.  BMP showed sodium 122, potassium 4.6, chloride 88, bicarb 17, blood glucose 889, BUN 67, creatinine 3.19 (baseline creatinine at 1.8-2.3), GFR 15, anion gap 17, ALP 144, albumin 3.1.  Urinalysis was positive for glycosuria, proteinuria and hematuria. CT abdomen and pelvis without contrast showed 1. Moderate fluid distention of the stomach. There are some fluid-filled jejunal loops without definite dilatation or transition point. Findings may be related to gastroenteritis. 2. Large amount of stool throughout the colon. 3. 18 mm low-density lesion in the tail of the pancreas, not seen on prior. This can be further characterized with MRI. 4. Severe atherosclerotic disease of the aorta and peripheral vessels. 5. Subcentimeter left Bosniak I benign renal cyst. No follow-up imaging is recommended. Patient was started on IV ceftriaxone and Diflucan.  IV fentanyl was given due to abdominal pain.  Reglan was given, IV hydration was provided and  patient was started on insulin drip per Endo tool.  Hospitalist was asked admit patient for further evaluation and management.   Review of Systems: Review of systems as noted in the HPI. All other systems reviewed and are negative.   Past Medical History:  Diagnosis Date   Acute renal failure superimposed on stage 3 chronic kidney disease 01/21/2016   Anemia    Anxiety    B12 deficiency    Brain tumor (benign) 10/29/2020   Cellulitis and abscess    Abdomen and buttocks   Chronic diastolic CHF (congestive heart failure) 03/16/2022   CKD (chronic kidney disease) stage 3, GFR 30-59 ml/min    Closed fracture of distal end of femur, unspecified fracture morphology, initial encounter    Coronary arteriosclerosis 07/15/2018   Crohn disease    Depression    Diabetic neuropathy    feet   DJD (degenerative joint disease)    Right forminal stenosis C4-5   Essential hypertension    Femur fracture, right 03/30/2017   Folliculitis    GERD (gastroesophageal reflux disease)    Gout    Headache(784.0)    Hiatal hernia    History of blood transfusion    History of bronchitis    History of cardiac catheterization    No significant CAD 2012   History of kidney stones    surgery to remove   Insomnia    Irritable bowel syndrome    Mixed hyperlipidemia    Nausea & vomiting 12/22/2014   Osteoarthritis    Palpitations    Pneumonia    Reflux esophagitis    Salmonella    Stroke  Tinnitus    Right   Type 2 diabetes mellitus    Past Surgical History:  Procedure Laterality Date   BACK SURGERY     c-spine surgery     03/2007   CARDIAC CATHETERIZATION     CHOLECYSTECTOMY     COLONOSCOPY  04/21/2011; 05/29/11   6/12: [facility] - ?AVM at IC valve, inflammatory changes at IC valve [facility]); 7/12 - [facility]; IC valve erosions, look chronic and probably Crohn's per path   colonoscopy  2017   [facility]: random biopsies normal. TI normal   ESOPHAGOGASTRODUODENOSCOPY  05/29/11    normal   ESOPHAGOGASTRODUODENOSCOPY N/A 01/21/2016   Dr. Bosie Clos: normal   EYE SURGERY Bilateral    cataracts removed   FEMUR IM NAIL Right 04/10/2018   Procedure: INTRAMEDULLARY (IM) NAIL FEMORAL;  Surgeon: Durene Romans, MD;  Location: [facility];  Service: Orthopedics;  Laterality: Right;   FRACTURE SURGERY     GIVENS CAPSULE STUDY  11/17/2020   HARDWARE REMOVAL Right 04/10/2018   Procedure: HARDWARE REMOVAL RIGHT DISTAL FEMUR;  Surgeon: Durene Romans, MD;  Location: [facility];  Service: Orthopedics;  Laterality: Right;   HERNIA REPAIR     IR FLUORO GUIDE CV LINE RIGHT  04/01/2017   IR US GUIDE VASC ACCESS RIGHT  04/01/2017   JOINT REPLACEMENT Right    hip   OPEN REDUCTION INTERNAL FIXATION (ORIF) DISTAL RADIAL FRACTURE Right 05/28/2019   Procedure: OPEN REDUCTION INTERNAL FIXATION (ORIF) DISTAL RADIAL FRACTURE;  Surgeon: Ernest Mallick, MD;  Location: [facility] OR;  Service: Orthopedics;  Laterality: Right;  with block 90 minutes   ORIF FEMUR FRACTURE Right 03/31/2017   Procedure: OPEN REDUCTION INTERNAL FIXATION (ORIF) DISTAL FEMUR FRACTURE;  Surgeon: Durene Romans, MD;  Location: [facility];  Service: Orthopedics;  Laterality: Right;   ORIF FEMUR FRACTURE Right 12/14/2022   Procedure: OPEN REDUCTION INTERNAL FIXATION (ORIF) DISTAL FEMUR FRACTURE;  Surgeon: Roby Lofts, MD;  Location: [facility] OR;  Service: Orthopedics;  Laterality: Right;   removal of kidney stone     Right knee arthroscopic surgery     TONSILLECTOMY     TOTAL ABDOMINAL HYSTERECTOMY      Social History:  reports that she has never smoked. She has never used smokeless tobacco. She reports that she does not drink alcohol and does not use drugs.   Allergies  Allergen Reactions   Cozaar [Losartan] Nausea And Vomiting, Swelling and Rash   Quinolones Itching, Nausea And Vomiting, Nausea Only and Swelling    Swelling of face, jaw, and lips    Cymbalta [Duloxetine Hcl] Itching and Swelling   Sulfa Antibiotics Nausea And Vomiting and  Swelling    Headache  Swelling of feet, legs    Synalar [Fluocinolone] Swelling   Cipro [Ciprofloxacin Hcl] Nausea And Vomiting and Swelling    Swelling of face, jaw, lips   Diovan [Valsartan] Nausea And Vomiting and Swelling   Glucophage [Metformin] Other (See Comments)    GI upset   Ketalar [Ketamine] Swelling   Norco [Hydrocodone-Acetaminophen] Itching and Swelling   Norvasc [Amlodipine Besylate] Itching and Swelling    Fluid retention    Nsaids Nausea And Vomiting and Other (See Comments)    Has bleeding ulcers   Tape Itching and Rash    Paper tape can only be used on this patient   Zestril [Lisinopril] Swelling and Rash    Oral swelling Red streaks on arms/legs    Family History  Problem Relation Age of Onset   Diabetes  Mother    Colon cancer Brother        POSSIBLY. Patient states diagnosed at age 72, then later states age 72. unclear and limited historian   Esophageal cancer Neg Hx    Rectal cancer Neg Hx    Stomach cancer Neg Hx      Prior to Admission medications   Medication Sig Start Date End Date Taking? Authorizing Provider  acetaminophen (TYLENOL) 325 MG tablet Take 2 tablets (650 mg total) by mouth every 4 (four) hours as needed for mild pain, moderate pain, fever or headache. 11/14/22   Laural BenesJohnson, Clanford L, MD  allopurinol (ZYLOPRIM) 100 MG tablet Take 100 mg by mouth daily. 03/20/21   [provider]  apixaban (ELIQUIS) 2.5 MG TABS tablet Take 1 tablet (2.5 mg total) by mouth 2 (two) times daily. 12/17/22 01/16/23  West BaliMcClung, Sarah A, PA-C  atorvastatin (LIPITOR) 80 MG tablet Take 1 tablet (80 mg total) by mouth daily. Restart on 06/29/22 06/22/22   Catarina Hartshornat, David, MD  diphenhydrAMINE (BENADRYL) 2 % cream Apply topically 3 (three) times daily as needed for itching. Patient not taking: Reported on 12/11/2022 12/01/21   Cleora FleetJohnson, Clanford L, MD  gabapentin (NEURONTIN) 300 MG capsule Take 1 capsule by mouth at bedtime.    [provider]  hydrALAZINE  (APRESOLINE) 50 MG tablet Take 1 tablet (50 mg total) by mouth every 8 (eight) hours. 10/21/22 12/11/22  Sherryll BurgerShah, Pratik D, DO  insulin aspart (NOVOLOG) 100 UNIT/ML FlexPen Inject 5 Units into the skin 3 (three) times daily with meals. Give only if eats 50% or more of meal. Patient taking differently: Inject 10 Units into the skin daily. Give only if eats 50% or more of meal. 11/14/22   Johnson, Clanford L, MD  insulin glargine-yfgn (SEMGLEE) 100 UNIT/ML Pen INJECT 15 UNITS UNDER THE SKIN ONCE DAILY    [provider]  levETIRAcetam (KEPPRA) 500 MG tablet Take 500 mg by mouth 2 (two) times daily.    [provider]  lipase/protease/amylase (CREON) 36000 UNITS CPEP capsule Take 36,000 Units by mouth 3 (three) times daily with meals.    [provider]  methocarbamol (ROBAXIN) 750 MG tablet Take 1 tablet (750 mg total) by mouth every 6 (six) hours as needed for muscle spasms. 12/17/22   West BaliMcClung, Sarah A, PA-C  metoprolol succinate (TOPROL-XL) 25 MG 24 hr tablet Take 1.5 tablets (37.5 mg total) by mouth daily. 11/14/22 12/14/22  Johnson, Clanford L, MD  naloxone [facility](NARCAN) nasal spray 4 mg/0.1 mL Place 1 spray into the nose once.    [provider]  oxyCODONE 10 MG TABS Take 0.5-1 tablets (5-10 mg total) by mouth every 4 (four) hours as needed for severe pain or moderate pain (5 mg pain scale 4-6, 10 mg pain scale 7-9). 12/17/22   West BaliMcClung, Sarah A, PA-C  polyethylene glycol (MIRALAX / GLYCOLAX) 17 g packet Take 17 g by mouth daily. Patient not taking: Reported on 12/11/2022 11/15/22   Cleora FleetJohnson, Clanford L, MD  venlafaxine XR (EFFEXOR-XR) 150 MG 24 hr capsule Take 150 mg by mouth daily. 07/11/22   [provider]    Physical Exam: BP (!) 143/56   Pulse (!) 105   Temp 98 F (36.7 C) (Oral)   Resp 15   Ht 5\' 4"  (1.626 m)   Wt 49.9 kg   LMP  (LMP Unknown)   SpO2 100%   BMI 18.88 kg/m   General: 72 y.o. year-old female ill appearing, but in no acute distress.  Alert  and oriented x3. HEENT: NCAT, EOMI, dry mucous membrane Neck: Supple, trachea medial Cardiovascular: Regular rate and rhythm with no rubs or gallops.  No thyromegaly or JVD noted.  No lower extremity edema. 2/4 pulses in all 4 extremities. Respiratory: Clear to auscultation with no wheezes or rales. Good inspiratory effort. Abdomen: Soft, tender to palpation without guarding.  Nondistended with normal bowel sounds x4 quadrants. Muskuloskeletal: No cyanosis, clubbing or edema noted bilaterally Neuro: CN II-XII intact, strength 5/5 x 4, sensation, reflexes intact Skin: No ulcerative lesions noted or rashes Psychiatry: Judgement and insight appear normal. Mood is appropriate for condition and setting          Labs on Admission:  Basic Metabolic Panel: Recent Labs  Lab 02/04/23 1711 02/04/23 1929  NA 122*  --   K 4.6  --   CL 88*  --   CO2 17*  --   GLUCOSE 889*  --   BUN 67*  --   CREATININE 3.19*  --   CALCIUM 9.2  --   MG  --  1.7   Liver Function Tests: Recent Labs  Lab 02/04/23 1711  AST 17  ALT 11  ALKPHOS 144*  BILITOT 1.3*  PROT 6.7  ALBUMIN 3.1*   Recent Labs  Lab 02/04/23 1711  LIPASE 24   No results for input(s): "AMMONIA" in the last 168 hours. CBC: Recent Labs  Lab 02/04/23 1711  WBC 16.8*  HGB 11.2*  HCT 34.4*  MCV 94.8  PLT 285   Cardiac Enzymes: No results for input(s): "CKTOTAL", "CKMB", "CKMBINDEX", "TROPONINI" in the last 168 hours.  BNP (last 3 results) No results for input(s): "BNP" in the last 8760 hours.  ProBNP (last 3 results) No results for input(s): "PROBNP" in the last 8760 hours.  CBG: Recent Labs  Lab 02/05/23 0435 02/05/23 0509 02/05/23 0536 02/05/23 0610 02/05/23 0714  GLUCAP 477* 419* 446* 309* 294*    Radiological Exams on Admission: CT Renal Stone Study  Result Date: 02/04/2023 CLINICAL DATA:  Abdominal and flank pain with emesis. EXAM: CT ABDOMEN AND PELVIS WITHOUT CONTRAST TECHNIQUE: Multidetector CT  imaging of the abdomen and pelvis was performed following the standard protocol without IV contrast. RADIATION DOSE REDUCTION: This exam was performed according to the departmental dose-optimization program which includes automated exposure control, adjustment of the mA and/or kV according to patient size and/or use of iterative reconstruction technique. COMPARISON:  CT abdomen and pelvis 08/03/2022 FINDINGS: Lower chest: No acute abnormality. Hepatobiliary: No focal liver abnormality is seen. Status post cholecystectomy. No biliary dilatation. Pancreas: Rounded low-density area in the tail the pancreas measures 18 mm in diameter and was not seen on prior. There is no acute pancreatic inflammation or ductal dilatation. Spleen: Normal in size without focal abnormality. Adrenals/Urinary Tract: Subcentimeter cortical cyst in the left kidney is unchanged. Otherwise, the adrenal glands, kidneys and bladder are within normal limits. Stomach/Bowel: There is moderate fluid distention of the stomach. There some fluid-filled jejunal loops without definite dilatation or transition point. The colon is nondilated. There is a large amount of stool throughout the colon. The appendix is not seen. No free air or pneumatosis. Vascular/Lymphatic: There are severe atherosclerotic disease throughout the aorta and peripheral vessels. No enlarged lymph nodes. No aortic aneurysm. Reproductive: Status post hysterectomy. No adnexal masses. Other: No abdominal wall hernia or abnormality. No abdominopelvic ascites. Musculoskeletal: L1-L4 posterior fusion hardware is present. There is stable compression fracture of T12. Right hip screw is present. Healed left inferior  pubic ramus fracture. IMPRESSION: 1. Moderate fluid distention of the stomach. There are some fluid-filled jejunal loops without definite dilatation or transition point. Findings may be related to gastroenteritis. 2. Large amount of stool throughout the colon. 3. 18 mm low-density  lesion in the tail of the pancreas, not seen on prior. This can be further characterized with MRI. 4. Severe atherosclerotic disease of the aorta and peripheral vessels. 5. Subcentimeter left Bosniak I benign renal cyst. No follow-up imaging is recommended. JACR 2018 Feb; 264-273, Management of the Incidental Renal Mass on CT, [employer] 2021; 814-848, Bosniak Classification of Cystic Renal Masses, Version 2019. Aortic Atherosclerosis (ICD10-I70.0). Electronically Signed   By: Darliss Cheney M.D.   On: 02/04/2023 20:42    EKG: I independently viewed the EKG done and my findings are as followed: Normal sinus rhythm at a rate of 97 bpm with QTc 497  Assessment/Plan Present on Admission:  Hyperosmolar hyperglycemic state (HHS)  Hypertensive urgency  Essential hypertension  Acute kidney injury superimposed on chronic kidney disease  UTI (urinary tract infection)  Prolonged QT interval  Chronic diastolic CHF (congestive heart failure)  Pseudohyponatremia  Principal Problem:   Hyperosmolar hyperglycemic state (HHS) Active Problems:   Uncontrolled type 2 diabetes mellitus with hyperglycemia, with long-term current use of insulin   Essential hypertension   Chronic diastolic CHF (congestive heart failure)   Prolonged QT interval   Hypertensive urgency   UTI (urinary tract infection)   Acute kidney injury superimposed on chronic kidney disease   Pseudohyponatremia   High anion gap metabolic acidosis  Hyperglycemia secondary to HHS Hyperglycemia secondary to poorly controlled type 2 diabetes mellitus Anion gap Metabolic acidosis Continue insulin drip, IV LR with IV potassium per DKA protocol Transition IV LR to D5 LR when serum glucose reaches 250mg /dL Continue serial BMP and VBG Continue to monitor for anion gap closure prior to transitioning patient to subcu insulin Continue NPO  Hypertensive urgency-resolved Essential hypertension - uncontrolled Continue IV hydralazine 10 mg every 6  hours as needed for SBP > 170  Acute on chronic CKD stage IV Dehydration Creatinine 3.19 (baseline creatinine at 1.8-2.3) Gentle hydration Renally adjust medications, avoid nephrotoxic agents/dehydration/hypotension  Pseudohyponatremia Sodium 122, corrected sodium for hyperglycemia (889) = 135 Continue to monitor sodium levels  Possible acute gastroenteritis CT abdomen and pelvis was suggestive of acute gastroenteritis, this may be viral in nature Continue IV fentanyl 25 mcg as needed  UTI POA  Patient was recently treated for UTI She complained of dysuria and was empirically started on IV ceftriaxone, we shall continue same at this time with plan to de-escalate/discontinue based on urine culture  Prolonged QT interval Qtc  Avoid QT prolonging drugs Magnesium level will be checked Repeat EKG in the morning  Chronic diastolic CHF (congestive heart failure) Stable and compensated.  Last echo 12/17/2022, EF of 60 to 65%.  No RWMA.  G1 DD  DVT prophylaxis: Heparin subcu   Code Status: Full code  Consults: None  Family Communication: None at bedside  Severity of Illness: The appropriate patient status for this patient is OBSERVATION. Observation status is judged to be reasonable and necessary in order to provide the required intensity of service to ensure the patient's safety. The patient's presenting symptoms, physical exam findings, and initial radiographic and laboratory data in the context of their medical condition is felt to place them at decreased risk for further clinical deterioration. Furthermore, it is anticipated that the patient will be medically stable for discharge from the hospital  within 2 midnights of admission.   Author: [name], DO 02/05/2023 8:06 AM  For on call review [email or web address].

## 2023-02-05 NOTE — Progress Notes (Addendum)
Heather [name] is a 72 y.o. female with medical history significant of hypertension, T2DM, diastolic CHF, CKD stage IV who presents to the emergency department due to 3-day onset of nonbloody vomiting, abdominal pain (periumbilical) and poor oral intake.  Patient also complained of left flank pain and painful urination, she states that she was recently treated for UTI and that she just completed an antibiotic course within the past week.  She was admitted with hyperglycemia secondary to DKA/HHS and AKI on CKD stage IV with dehydration secondary to possible acute gastroenteritis.  She continues to complain of some abdominal pain and nausea.  Plan to continue IV insulin drip and IV fluid for now.  Unfortunately labs could not be obtained due to limited venous access and nursing staff requesting central venous line placement.  I requested general surgery for placement and labs will need to be monitored carefully prior to transition.  Anticipating discharge in the next 1-2 days if tolerating diet and laboratory data shows signs of improvement.  Patient seen and evaluated at bedside and has been admitted after midnight.  A.m. labs ordered.  Total care time: 35 minutes.

## 2023-02-05 NOTE — TOC Progression Note (Signed)
Transition of Care [facility]) - Progression Note    Patient Details  Name: SAMMARA WAREHAM MRN: [id number] Date of Birth: [birth year]  Transition of Care [facility]) CM/SW Contact  Karn Cassis, [place] Phone Number: 02/05/2023, 10:19 AM  Clinical Narrative:  Transition of Care [facility]) Screening Note   Patient Details  Name: LIONEL EYERMAN Date of Birth: 1951-02-28   Transition of Care [facility]) CM/SW Contact:    Karn Cassis, LCSW Phone Number: 02/05/2023, 10:19 AM    Transition of Care Department [facility]) has reviewed patient and no TOC needs have been identified at this time. We will continue to monitor patient advancement through interdisciplinary progression rounds. If new patient transition needs arise, please place a TOC consult.          Barriers to Discharge: Continued Medical Work up  Expected Discharge Plan and Services                                               Social Determinants of Health (SDOH) Interventions SDOH Screenings   Food Insecurity: No Food Insecurity (12/12/2022)  Housing: Low Risk  (12/12/2022)  Transportation Needs: No Transportation Needs (12/12/2022)  Utilities: Not At Risk (12/12/2022)  Tobacco Use: Low Risk  (02/04/2023)    Readmission Risk Interventions    11/14/2022   11:31 AM 11/12/2022    4:06 PM 10/18/2022   12:42 PM  Readmission Risk Prevention Plan  Transportation Screening  Complete Complete  Medication Review [employer])  Complete Complete  PCP or Specialist appointment within 3-5 days of discharge Complete Not Complete   HRI or Home Care Consult  Complete Complete  SW Recovery Care/Counseling Consult  Complete Complete  Palliative Care Screening  Not Applicable Not Applicable  Skilled Nursing Facility  Complete Complete

## 2023-02-05 NOTE — Inpatient Diabetes Management (Addendum)
Inpatient Diabetes Program Recommendations  AACE/ADA: New Consensus Statement on Inpatient Glycemic Control  Target Ranges:  Prepandial:   less than 140 mg/dL      Peak postprandial:   less than 180 mg/dL (1-2 hours)      Critically ill patients:  140 - 180 mg/dL    Latest Reference Range & Units 02/05/23 05:36 02/05/23 06:10 02/05/23 07:14 02/05/23 08:20  Glucose-Capillary 70 - 99 mg/dL 250 (H) 539 (H) 767 (H) 258 (H)    Latest Reference Range & Units 02/04/23 17:11  CO2 22 - 32 mmol/L 17 (L)  Glucose 70 - 99 mg/dL 341 (HH)  Anion gap 5 - 15  17 (H)    Latest Reference Range & Units 02/04/23 19:29 02/05/23 02:00  Beta-Hydroxybutyric Acid 0.05 - 0.27 mmol/L 0.62 (H) 0.11    Latest Reference Range & Units 10/15/22 16:09 10/16/22 09:19 10/17/22 04:19  Hemoglobin A1C 4.8 - 5.6 % >15.5 (H) >15.5 (H) >15.5 (H)   Review of Glycemic Control  Diabetes history: DM2 Outpatient Diabetes medications: Semglee 15 units daily, Novolog 5 units TID with meals Current orders for Inpatient glycemic control: IV insulin  Inpatient Diabetes Program Recommendations:    Insulin: Once provider is ready to transition from IV to SQ insulin, please consider ordering Semglee 7 units Q24H (based on 49.9 kg x 0.15 units), CBGs Q4H, and Novolog 0-9 units Q4H.  HbgA1C: Please consider ordering an A1C to evaluate glycemic control over the past 2-3 months.  Addendum 02/05/23@14 :05-Spoke with patient at bedside in the ED at 12:45. Patient was not able to provide much information about insulins or dosages she is taking. Patient reports that her husband gives her insulin injections and checks her glucose. She states she has 2 kinds of insulin but does not know the name of them. Patient reports that her sugar is always high and mostly reads "HI" on glucometer when he checks her glucose. Patient reports that she goes to [facility] for DM control and not sure when she last went or if she has any appointments  scheduled. Patient notes that she does not really eat much and mostly just snacks or takes a couple bits of food when she is given a meal. Stressed need to be DM under better control especially given hyperglycemia and A1C's over 15.5%. Discussed glucose and A1C goals. Asked patient to be sure to get appointment to follow up with Endocrinology. Informed patient I would call her husband to find out more information. Called patient's husband on his cell phone. He states that he is not at home currently so not sure of the names of the insulins he gives his wife. He notes that he gives her 15 units of long acting insulin in the morning around 9-10 am daily and the other insulin when she eats. He notes that patient does not eat much so he does not give it to her if she isn't eating. He reports that he checks patient's glucose 2 times a day and that it mostly reads "HI" all the time. He reports that her glucometer is about 71 year old. He reports that he gives patient insulin injections in her arm mainly. Discussed insulin injection sites and encouraged to use other areas and try using the abdomen to see if that helps improve glucose control and insulin absorption. Stressed importance of site rotation and staying away from any areas of hardened skin tissue. He states that patient has not seen Endocrinology in a while and no appointment scheduled already.  Asked that he call [facility] and request appointment so patient can follow up and get assistance with diabetes management to get DM under control. Patient's husband verbalized understanding and will call to get her an appointment with Endocrinology.   Thanks, Orlando Penner, RN, MSN, CDCES Diabetes Coordinator Inpatient Diabetes Program [phone] (Team Pager from 8am to 5pm)

## 2023-02-05 NOTE — Progress Notes (Signed)
[facility]  CVL in place and no ptx. Ok to use.  Algis Greenhouse, MD

## 2023-02-05 NOTE — ED Notes (Signed)
Phlebotomy and RN attempted to get BMP with no success. MD paged.

## 2023-02-06 ENCOUNTER — Encounter (HOSPITAL_COMMUNITY): Payer: Self-pay | Admitting: Nephrology

## 2023-02-06 DIAGNOSIS — E1100 Type 2 diabetes mellitus with hyperosmolarity without nonketotic hyperglycemic-hyperosmolar coma (NKHHC): Secondary | ICD-10-CM | POA: Diagnosis not present

## 2023-02-06 LAB — COMPREHENSIVE METABOLIC PANEL
ALT: 12 U/L (ref 0–44)
AST: 19 U/L (ref 15–41)
Albumin: 2.1 g/dL — ABNORMAL LOW (ref 3.5–5.0)
Alkaline Phosphatase: 97 U/L (ref 38–126)
Anion gap: 8 (ref 5–15)
BUN: 73 mg/dL — ABNORMAL HIGH (ref 8–23)
CO2: 16 mmol/L — ABNORMAL LOW (ref 22–32)
Calcium: 8 mg/dL — ABNORMAL LOW (ref 8.9–10.3)
Chloride: 103 mmol/L (ref 98–111)
Creatinine, Ser: 2.74 mg/dL — ABNORMAL HIGH (ref 0.44–1.00)
GFR, Estimated: 18 mL/min — ABNORMAL LOW (ref >60)
Glucose, Bld: 198 mg/dL — ABNORMAL HIGH (ref 70–99)
Potassium: 3.3 mmol/L — ABNORMAL LOW (ref 3.5–5.1)
Sodium: 127 mmol/L — ABNORMAL LOW (ref 135–145)
Total Bilirubin: 0.7 mg/dL (ref 0.3–1.2)
Total Protein: 4.9 g/dL — ABNORMAL LOW (ref 6.5–8.1)

## 2023-02-06 LAB — CBC
HCT: 27 % — ABNORMAL LOW (ref 36.0–46.0)
Hemoglobin: 9.1 g/dL — ABNORMAL LOW (ref 12.0–15.0)
MCH: 30.5 pg (ref 26.0–34.0)
MCHC: 33.7 g/dL (ref 30.0–36.0)
MCV: 90.6 fL (ref 80.0–100.0)
Platelets: 211 10*3/uL (ref 150–400)
RBC: 2.98 MIL/uL — ABNORMAL LOW (ref 3.87–5.11)
RDW: 14.4 % (ref 11.5–15.5)
WBC: 16.6 10*3/uL — ABNORMAL HIGH (ref 4.0–10.5)
nRBC: 0 % (ref 0.0–0.2)

## 2023-02-06 LAB — BASIC METABOLIC PANEL
Anion gap: 9 (ref 5–15)
Anion gap: 8 (ref 5–15)
BUN: 71 mg/dL — ABNORMAL HIGH (ref 8–23)
BUN: 75 mg/dL — ABNORMAL HIGH (ref 8–23)
CO2: 17 mmol/L — ABNORMAL LOW (ref 22–32)
CO2: 17 mmol/L — ABNORMAL LOW (ref 22–32)
Calcium: 8.2 mg/dL — ABNORMAL LOW (ref 8.9–10.3)
Calcium: 8 mg/dL — ABNORMAL LOW (ref 8.9–10.3)
Chloride: 101 mmol/L (ref 98–111)
Chloride: 103 mmol/L (ref 98–111)
Creatinine, Ser: 2.74 mg/dL — ABNORMAL HIGH (ref 0.44–1.00)
Creatinine, Ser: 2.72 mg/dL — ABNORMAL HIGH (ref 0.44–1.00)
GFR, Estimated: 18 mL/min — ABNORMAL LOW (ref >60)
GFR, Estimated: 18 mL/min — ABNORMAL LOW (ref >60)
Glucose, Bld: 199 mg/dL — ABNORMAL HIGH (ref 70–99)
Glucose, Bld: 156 mg/dL — ABNORMAL HIGH (ref 70–99)
Potassium: 3.5 mmol/L (ref 3.5–5.1)
Potassium: 3.3 mmol/L — ABNORMAL LOW (ref 3.5–5.1)
Sodium: 127 mmol/L — ABNORMAL LOW (ref 135–145)
Sodium: 128 mmol/L — ABNORMAL LOW (ref 135–145)

## 2023-02-06 LAB — PHOSPHORUS: Phosphorus: 1.8 mg/dL — ABNORMAL LOW (ref 2.5–4.6)

## 2023-02-06 LAB — URINE CULTURE: Culture: 50000 — AB

## 2023-02-06 LAB — GLUCOSE, CAPILLARY
Glucose-Capillary: 209 mg/dL — ABNORMAL HIGH (ref 70–99)
Glucose-Capillary: 188 mg/dL — ABNORMAL HIGH (ref 70–99)
Glucose-Capillary: 156 mg/dL — ABNORMAL HIGH (ref 70–99)
Glucose-Capillary: 130 mg/dL — ABNORMAL HIGH (ref 70–99)
Glucose-Capillary: 76 mg/dL (ref 70–99)
Glucose-Capillary: 78 mg/dL (ref 70–99)

## 2023-02-06 LAB — MAGNESIUM: Magnesium: 1.3 mg/dL — ABNORMAL LOW (ref 1.7–2.4)

## 2023-02-06 MED ORDER — POLYETHYLENE GLYCOL 3350 17 G PO PACK: 17.0000 g | PACK | Freq: Two times a day (BID) | ORAL | Status: DC | PRN

## 2023-02-06 MED ORDER — POLYETHYLENE GLYCOL 3350 17 G PO PACK: 17.0000 g | PACK | Freq: Two times a day (BID) | ORAL | Status: DC

## 2023-02-06 MED ORDER — SODIUM CHLORIDE 0.9 % IV SOLN: INTRAVENOUS | Status: DC

## 2023-02-06 MED ORDER — LOPERAMIDE HCL 2 MG PO CAPS
2.0000 mg | ORAL_CAPSULE | Freq: Once | ORAL | Status: AC
  Administered 2023-02-06: 2 mg via ORAL
  Filled 2023-02-06: qty 1

## 2023-02-06 MED ORDER — MAGNESIUM SULFATE 2 GM/50ML IV SOLN
2.0000 g | Freq: Once | INTRAVENOUS | Status: AC
  Administered 2023-02-06: 2 g via INTRAVENOUS
  Filled 2023-02-06: qty 50

## 2023-02-06 MED ORDER — METOPROLOL SUCCINATE ER 25 MG PO TB24
37.5000 mg | ORAL_TABLET | Freq: Every day | ORAL | Status: DC
  Administered 2023-02-06 – 2023-02-11 (×6): 37.5 mg via ORAL
  Filled 2023-02-06 (×7): qty 2

## 2023-02-06 MED ORDER — FLUCONAZOLE 100 MG PO TABS
100.0000 mg | ORAL_TABLET | Freq: Every day | ORAL | Status: DC
  Administered 2023-02-06 – 2023-02-08 (×3): 100 mg via ORAL
  Filled 2023-02-06 (×4): qty 1

## 2023-02-06 NOTE — Progress Notes (Signed)
   02/06/23 0126  Assess: MEWS Score  Temp 98.4 F (36.9 C)  BP (!) 162/107  MAP (mmHg) 121  Pulse Rate (!) 139  Resp 15  Level of Consciousness Alert  SpO2 100 %  O2 Device Room Air  Assess: MEWS Score  MEWS Temp 0  MEWS Systolic 0  MEWS Pulse 3  MEWS RR 0  MEWS LOC 0  MEWS Score 3  MEWS Score Color Yellow  Assess: if the MEWS score is Yellow or Red  Were vital signs taken at a resting state? Yes  Focused Assessment No change from prior assessment  Does the patient meet 2 or more of the SIRS criteria? No  MEWS guidelines implemented  Yes, yellow  Treat  MEWS Interventions Considered administering scheduled or prn medications/treatments as ordered  Take Vital Signs  Increase Vital Sign Frequency  Yellow: Q2hr x1, continue Q4hrs until patient remains green for 12hrs  Escalate  MEWS: Escalate Yellow: Discuss with charge nurse and consider notifying provider and/or RRT  Notify: Charge Nurse/RN  Name of Charge Nurse/RN Notified  (Latina Logan Bores, RN)  Provider Notification  Provider Name/Title  (Dr. Thomes Dinning)  Date Provider Notified 02/06/23  Time Provider Notified 0130  Method of Notification Page  Notification Reason Other (Comment) (yellow mews, and vitals)  Provider response See new orders  Date of Provider Response 02/06/23  Time of Provider Response 0210  Assess: SIRS CRITERIA  SIRS Temperature  0  SIRS Pulse 1  SIRS Respirations  0  SIRS WBC 0  SIRS Score Sum  1   Pt. Admitted to floor 300. Upon assessment vitals signs, pt's HR, BP elevated (see above.). Pt. Changed to a yellow mews due to these abnormalities. Charge nurse notified, MD notified. New orders given, meds given. Rechecked vitals BP: 128/59 HR: 114 RR: 16 O2 100 on room air, temp: 98.5 oral. Pt. Complained of nausea, meds given. Will continue to monitor and report off to the oncoming shift as indicated.

## 2023-02-06 NOTE — Progress Notes (Incomplete)
Patient requested something to help her have less bowel movements. Pt. States she has been having very loose BM's all day. Notified MD, new order given. Medication given.While cleaning patient up, noticed redness in perineum area, and redness and erythema in sacral area of buttocks and coccyx. Foam dressing in place, removed and noticed that due to pt.'s skin being so frail, foam pulls pt. Skin too much, therefore, foam was not replaced at this time. Pt. Stated that it was too painful with the foam when its pulled off. Therefore, it is recommended q 2 turns to relieve pressure from these areas. Area cleansed, patted to dry and applied moisture barrier cream to areas. Patient also complained of nausea, prn medication given.   Came to pt.'s room to give pt.'s meds and pt asked for more nausea medication, meds given. Pt. States that she is still having several loose stools. Pt states she is thirsty but scared to drink anything because she throws everything up. Pt. Requested ice chips, gave ice chips. Pt. Weak and can barely hold spoon to give herself ice chips with spoon. Assisted pt. With giving 1 ice chip at a time. Approx. 1 minute after eating ice chip, pt. Starts dry heaving. No emesis visualized. Offered pt. 3 ice chips and after each ice chip, pt. Dry heaves. Then pt. Burps. Offered pt. Ginger ale to sip on and pt. Started to dry heave. Asked pt. If her stomach hurts while she attempts to drink or eat, pt. States," yes, my stomach hurts and I feel like I'mgoingn to throw up. The medications don't help."   Called to room by pt. Stating she needs pain medications because her belly hurts a lot. On a scale of 0-10, pt. Scores a 10 of pain in abdomen. PRN pain medication given.   Pt. Complained of nausea again and requested nausea medication. Meds given. Pt. Had another loose small smear of brown stool, pt. Cleansed, Pt. Turned. Pt. Urinated a cloudy, light brownish/grayish color urine. MD notified.

## 2023-02-06 NOTE — Progress Notes (Signed)
PROGRESS NOTE    [name]  SLP:[id number] DOB: 1951/05/01 DOA: 02/04/2023 PCP: [name], MD  Chief Complaint  Patient presents with   Emesis    Brief Narrative:   Heather [name] is Heather [name] 72 y.o. female with medical history significant of hypertension, T2DM, diastolic CHF, CKD stage IV who presents to the emergency department due to 3-day onset of nonbloody vomiting, abdominal pain (periumbilical) and poor oral intake.  Patient also complained of left flank pain and painful urination, she states that she was recently treated for UTI and that she just completed an antibiotic course within the past week.  She was admitted with hyperglycemia secondary to DKA/HHS and AKI on CKD stage IV with dehydration secondary to possible acute gastroenteritis.  She continues to complain of some abdominal pain and nausea.    Assessment & Plan:   Principal Problem:   Hyperosmolar hyperglycemic state (HHS) Active Problems:   Uncontrolled type 2 diabetes mellitus with hyperglycemia, with long-term current use of insulin   Essential hypertension   Chronic diastolic CHF (congestive heart failure)   Prolonged QT interval   Hypertensive urgency   UTI (urinary tract infection)   Acute kidney injury superimposed on chronic kidney disease   Pseudohyponatremia   High anion gap metabolic acidosis   Poor intravenous access  Hyperglycemia secondary to HHS Hyperglycemia secondary to poorly controlled type 2 diabetes mellitus Anion gap Metabolic acidosis Resolved Now on SSI and lantus Heart healthy, carb mod diet   Hypertensive urgency Essential hypertension Continue metoprolol    Acute on chronic CKD stage IV Dehydration Creatinine 3.19 (baseline creatinine at 1.8-2.3) Creatinine 2.7 today, follow with IVF CT without hydro  Renally adjust medications, avoid nephrotoxic agents/dehydration/hypotension   Hyponatremia Follow with IVF  acute gastroenteritis CT abdomen and pelvis was  suggestive of acute gastroenteritis, this may be viral in nature She continues to have abdominal discomfort, poor PO intake Continue supportive care, pain meds, antiemetics, IVF   Constipation Large amount of stool throughout colon on imaging She's had 5 loose stools today, related to gastroenteritis vs overflow diarrhea from constipation Will need bowel regimen  Monitor for now  UTI POA  Patient was recently treated for UTI She complained of dysuria and was empirically started on IV ceftriaxone, we shall continue same at this time with plan to de-escalate/discontinue based on urine culture -> yeast on urine culture, will stop abx and start fluconazole    Prolonged QT interval Qtc  Avoid QT prolonging drugs Repeat EKG   Chronic diastolic CHF (congestive heart failure) Stable and compensated.  Last echo 12/17/2022, EF of 60 to 65%.  No RWMA.  G1 DD      DVT prophylaxis: heparin Code Status: full Family Communication: none Disposition:   Status is: Inpatient Remains inpatient appropriate because: pending further improvement, continued abdominal pain, inability to tolerate PO   Consultants:  none  Procedures:  none  Antimicrobials:  Anti-infectives (From admission, onward)    Start     Dose/Rate Route Frequency Ordered Stop   02/05/23 2200  cefTRIAXone (ROCEPHIN) 1 g in sodium chloride 0.9 % 100 mL IVPB        1 g 200 mL/hr over 30 Minutes Intravenous Every 24 hours 02/05/23 0740     02/05/23 0100  cefTRIAXone (ROCEPHIN) 1 g in sodium chloride 0.9 % 100 mL IVPB        1 g 200 mL/hr over 30 Minutes Intravenous  Once 02/05/23 0045 02/05/23 0201   02/05/23 0100  fluconazole (DIFLUCAN) tablet 150 mg        150 mg Oral  Once 02/05/23 0045 02/05/23 0120       Subjective: C/o abdominal pain No BM in [name] few days Last passed gas [name] few days ago Just had BM this afternoon per RN  Objective: Vitals:   02/06/23 0126 02/06/23 0326 02/06/23 0751 02/06/23 1545  BP:  (!) 162/107 (!) 128/59 (!) 155/60 132/63  Pulse: (!) 139 (!) 114 (!) 103 100  Resp: Temp: 98.4 F (36.9 C) 98.5 F (36.9 C) 98.4 F (36.9 C) 98.5 F (36.9 C)  TempSrc: Oral Oral Oral Oral  SpO2: 100% 100% 99% 98%  Weight:      Height:        Intake/Output Summary (Last 24 hours) at 02/06/2023 1809 Last data filed at 02/06/2023 1600 Gross per 24 hour  Intake 2389.92 ml  Output 250 ml  Net 2139.92 ml   Filed Weights   02/04/23 1437  Weight: 49.9 kg    Examination:  General exam: appears uncomfortable, laying in fetal position  Respiratory system: unlabored Cardiovascular system: RRR Gastrointestinal system: mild diffuse TTP  Central nervous system: Alert and oriented. No focal neurological deficits. Extremities: no LEE   Data Reviewed: I have personally reviewed following labs and imaging studies  CBC: Recent Labs  Lab 02/04/23 1711 02/06/23 0440  WBC 16.8* 16.6*  HGB 11.2* 9.1*  HCT 34.4* 27.0*  MCV 94.8 90.6  PLT 285 211    Basic Metabolic Panel: Recent Labs  Lab 02/04/23 1929 02/05/23 0906 02/05/23 1441 02/05/23 1945 02/05/23 2321 02/06/23 0440 02/06/23 0823  NA  --    < > 127* 128* 127* 127* 128*  K  --    < > 3.7 3.5 3.5 3.3* 3.3*  CL  --    < > 100 101 101 103 103  CO2  --    < > 17* 19* 17* 16* 17*  GLUCOSE  --    < > 189* 169* 199* 198* 156*  BUN  --    < > 71* 70* 71* 73* 75*  CREATININE  --    < > 2.79* 2.77* 2.74* 2.74* 2.72*  CALCIUM  --    < > 8.4* 8.2* 8.2* 8.0* 8.0*  MG 1.7  --   --   --   --  1.3*  --   PHOS  --   --   --   --   --  1.8*  --    < > = values in this interval not displayed.    GFR: Estimated Creatinine Clearance: 15.2 mL/min (Althea Backs) (by C-G formula based on SCr of 2.72 mg/dL (H)).  Liver Function Tests: Recent Labs  Lab 02/04/23 1711 02/06/23 0440  AST 17 19  ALT 11 12  ALKPHOS 144* 97  BILITOT 1.3* 0.7  PROT 6.7 4.9*  ALBUMIN 3.1* 2.1*    CBG: Recent Labs  Lab 02/06/23 0129 02/06/23 0430  02/06/23 0753 02/06/23 1108 02/06/23 1646  GLUCAP 209* 188* 156* 130* 76     Recent Results (from the past 240 hour(s))  Urine Culture     Status: Abnormal   Collection Time: 02/04/23 11:40 PM   Specimen: Urine, Clean Catch  Result Value Ref Range Status   Specimen Description   Final    URINE, CLEAN CATCH Performed at [facility], [address], [place], [place] [address]    Special Requests   Final  NONE Performed at [facility], [address] [place], [place] [address]    Culture 50,000 COLONIES/mL YEAST (Uva Runkel)  Final   Report Status 02/06/2023 FINAL  Final         Radiology Studies: DG Chest Port 1 View  Result Date: 02/05/2023 CLINICAL DATA:  Placement of central venous catheter EXAM: PORTABLE CHEST 1 VIEW COMPARISON:  Previous studies including the examination done on 12/15/2022 FINDINGS: Cardiac size is within normal limits. Apparent shift of mediastinum to the right may be due to rotation. There is interval placement of central venous catheter through the right internal jugular with its tip in right atrium. There is no pleural effusion or pneumothorax. Small linear densities are seen in right lower lung field. Surgical fusion is seen in cervical spine, upper thoracic spine and lumbar spine. Surgical clips are seen in gallbladder fossa. There is deformity in right clavicle consistent with old healed fracture. IMPRESSION: Tip of right IJ central venous catheter is seen in right atrium. There is no pneumothorax. Linear densities in left lower lung field may suggest subsegmental atelectasis or scarring. Electronically Signed   By: Ernie AvenaPalani  Rathinasamy M.D.   On: 02/05/2023 12:55   US EKG SITE RITE  Result Date: 02/05/2023 If Site Rite image not attached, placement could not be confirmed due to current cardiac rhythm.  CT Renal Stone Study  Result Date: 02/04/2023 CLINICAL DATA:  Abdominal and flank pain with emesis. EXAM: CT ABDOMEN AND PELVIS WITHOUT CONTRAST TECHNIQUE:  Multidetector CT imaging of the abdomen and pelvis was performed following the standard protocol without IV contrast. RADIATION DOSE REDUCTION: This exam was performed according to the departmental dose-optimization program which includes automated exposure control, adjustment of the mA and/or kV according to patient size and/or use of iterative reconstruction technique. COMPARISON:  CT abdomen and pelvis 08/03/2022 FINDINGS: Lower chest: No acute abnormality. Hepatobiliary: No focal liver abnormality is seen. Status post cholecystectomy. No biliary dilatation. Pancreas: Rounded low-density area in the tail the pancreas measures 18 mm in diameter and was not seen on prior. There is no acute pancreatic inflammation or ductal dilatation. Spleen: Normal in size without focal abnormality. Adrenals/Urinary Tract: Subcentimeter cortical cyst in the left kidney is unchanged. Otherwise, the adrenal glands, kidneys and bladder are within normal limits. Stomach/Bowel: There is moderate fluid distention of the stomach. There some fluid-filled jejunal loops without definite dilatation or transition point. The colon is nondilated. There is Analeah Brame large amount of stool throughout the colon. The appendix is not seen. No free air or pneumatosis. Vascular/Lymphatic: There are severe atherosclerotic disease throughout the aorta and peripheral vessels. No enlarged lymph nodes. No aortic aneurysm. Reproductive: Status post hysterectomy. No adnexal masses. Other: No abdominal wall hernia or abnormality. No abdominopelvic ascites. Musculoskeletal: L1-L4 posterior fusion hardware is present. There is stable compression fracture of T12. Right hip screw is present. Healed left inferior pubic ramus fracture. IMPRESSION: 1. Moderate fluid distention of the stomach. There are some fluid-filled jejunal loops without definite dilatation or transition point. Findings may be related to gastroenteritis. 2. Large amount of stool throughout the colon. 3.  18 mm low-density lesion in the tail of the pancreas, not seen on prior. This can be further characterized with MRI. 4. Severe atherosclerotic disease of the aorta and peripheral vessels. 5. Subcentimeter left Bosniak I benign renal cyst. No follow-up imaging is recommended. JACR 2018 Feb; 264-273, Management of the Incidental Renal Mass on CT, [employer] 2021; [id number], Bosniak Classification of Cystic Renal Masses, Version 2019. Aortic Atherosclerosis (  ICD10-I70.0). Electronically Signed   By: Darliss Cheney M.D.   On: 02/04/2023 20:42        Scheduled Meds:  heparin  5,000 Units Subcutaneous Q8H   insulin aspart  0-9 Units Subcutaneous Q4H   insulin glargine-yfgn  7 Units Subcutaneous Daily   metoprolol succinate  37.5 mg Oral Daily   polyethylene glycol  17 g Oral BID   Continuous Infusions:  sodium chloride 125 mL/hr at 02/06/23 1050   cefTRIAXone (ROCEPHIN)  IV Stopped (02/06/23 0900)   dextrose 5% lactated ringers Stopped (02/06/23 0011)     LOS: 1 day    Time spent: over 30 min    Lacretia Nicks, MD [facility]   To contact the attending provider between 7A-7P or the covering provider during after hours 7P-7A, please log into the web site [email or web address] and access using universal [place] password for that web site. If you do not have the password, please call the hospital operator.  02/06/2023, 6:09 PM

## 2023-02-06 NOTE — Progress Notes (Signed)
Pharmacy Antibiotic Note  Heather [name] is a 72 y.o. female admitted on 02/04/2023 with UTI.  Pharmacy has been consulted for fluconazole dosing. Pt with CKD at BL, borderline QTc with repeat pending. Urine culture with yeast.  Plan: Fluconazole 100mg  daily  Height: 5\' 4"  (162.6 cm) Weight: 49.9 kg (110 lb) IBW/kg (Calculated) : 54.7  Temp (24hrs), Avg:98.4 F (36.9 C), Min:98.1 F (36.7 C), Max:98.5 F (36.9 C)  Recent Labs  Lab 02/04/23 1711 02/05/23 0906 02/05/23 1441 02/05/23 1945 02/05/23 2321 02/06/23 0440 02/06/23 0823  WBC 16.8*  --   --   --   --  16.6*  --   CREATININE 3.19*   < > 2.79* 2.77* 2.74* 2.74* 2.72*   < > = values in this interval not displayed.    Estimated Creatinine Clearance: 15.2 mL/min (A) (by C-G formula based on SCr of 2.72 mg/dL (H)).    Allergies  Allergen Reactions   Cozaar [Losartan] Nausea And Vomiting, Swelling and Rash   Quinolones Itching, Nausea And Vomiting, Nausea Only and Swelling    Swelling of face, jaw, and lips    Cymbalta [Duloxetine Hcl] Itching and Swelling   Sulfa Antibiotics Nausea And Vomiting and Swelling    Headache  Swelling of feet, legs    Synalar [Fluocinolone] Swelling   Cipro [Ciprofloxacin Hcl] Nausea And Vomiting and Swelling    Swelling of face, jaw, lips   Diovan [Valsartan] Nausea And Vomiting and Swelling   Glucophage [Metformin] Other (See Comments)    GI upset   Ketalar [Ketamine] Swelling   Norco [Hydrocodone-Acetaminophen] Itching and Swelling   Norvasc [Amlodipine Besylate] Itching and Swelling    Fluid retention    Nsaids Nausea And Vomiting and Other (See Comments)    Has bleeding ulcers   Tape Itching and Rash    Paper tape can only be used on this patient   Zestril [Lisinopril] Swelling and Rash    Oral swelling Red streaks on arms/legs    Fredonia Highland, PharmD, BCPS, [facility] Clinical Pharmacist Please check AMION for all [facility] Pharmacy numbers 02/06/2023

## 2023-02-06 NOTE — TOC Initial Note (Signed)
Transition of Care [facility]) - Initial/Assessment Note    Patient Details  Name: Heather [name] MRN: [id number] Date of Birth: 02-Oct-1951  Transition of Care [facility]) CM/SW Contact:    Karn Cassis, LCSW Phone Number: 02/06/2023, 8:29 AM  Clinical Narrative: Pt admitted for hyperglycemia. Assessment completed due to high risk readmission score. Pt lives with her husband who assists with ADLs. Pt's husband reports pt hasn't gotten up much since she d/c from [facility] after 20 days in Feb/March. She is active with Ladene Artist, OT, RN per Kandee Keen with [place]. Will need resumption orders. Pt's husband plans on taking pt home at d/c. TOC will continue to follow.                   Expected Discharge Plan: Home w Home Health Services Barriers to Discharge: Continued Medical Work up   Patient Goals and CMS Choice Patient states their goals for this hospitalization and ongoing recovery are:: return home   Choice offered to / list presented to : Spouse [place] ownership interest in [facility].provided to::  (n/a)    Expected Discharge Plan and Services In-house Referral: Clinical Social Work   Post Acute Care Choice: Resumption of Svcs/PTA Provider Living arrangements for the past 2 months: Single Family Home                           HH Arranged: RN, PT, OT HH Agency: [facility] Date [facility] Agency Contacted: 02/06/23 Time HH Agency Contacted: [phone] Representative spoke with at [facility] Agency: Kandee Keen  Prior Living Arrangements/Services Living arrangements for the past 2 months: Single Family Home   Patient language and need for interpreter reviewed:: Yes Do you feel safe going back to the place where you live?: Yes      Need for Family Participation in Patient Care: Yes (Comment) Care giver support system in place?: Yes (comment) Current home services: DME, Home OT, Home PT, Home RN (cane, walker, BSC) Criminal Activity/Legal Involvement Pertinent  to Current Situation/Hospitalization: No - Comment as needed  Activities of Daily Living      Permission Sought/Granted                  Emotional Assessment         Alcohol / Substance Use: Not Applicable Psych Involvement: No (comment)  Admission diagnosis:  Urinary tract infection with hematuria, site unspecified [N39.0, R31.9] Diabetic ketoacidosis without coma associated with type 2 diabetes mellitus [E11.10] Hyperosmolar hyperglycemic state (HHS) [E11.00] Patient Active Problem List   Diagnosis Date Noted   Hyperosmolar hyperglycemic state (HHS) 02/05/2023   High anion gap metabolic acidosis 02/05/2023   Poor intravenous access 02/05/2023   Fracture 12/11/2022   Pseudohyponatremia 11/12/2022   Thrombocytosis 11/12/2022   DKA (diabetic ketoacidosis) 11/12/2022   Uncontrolled type 2 diabetes mellitus with hyperglycemia 11/11/2022   Acute kidney injury superimposed on chronic kidney disease 10/17/2022   DKA, type 2 10/16/2022   History of right shoulder fracture 10/15/2022   Aortic atherosclerosis 08/01/2022   Recurrent falls 07/04/2022   Humeral head fracture 07/04/2022   Right leg weakness 06/20/2022   Anxiety 06/20/2022   UTI (urinary tract infection) 06/20/2022   Malnutrition of moderate degree 06/20/2022   Hyperkalemia 06/19/2022   Type 2 diabetes mellitus with hyperosmolar nonketotic hyperglycemia 05/27/2022   Acute kidney failure 03/17/2022   Mixed diabetic hyperlipidemia associated with type 2 diabetes mellitus 03/16/2022   Chronic diastolic  CHF (congestive heart failure) 03/16/2022   Decubitus ulcer of left buttock, stage 2 03/16/2022   Cellulitis of left hand 11/30/2021   Closed fracture of part of upper end of humerus 11/30/2021   At risk for falls 11/28/2021   Neoplasm of meninges 11/28/2021   Sepsis 11/28/2021   Acute on chronic anemia 11/28/2021   Cerebral thrombosis with cerebral infarction 08/28/2021   Cerebrovascular accident (CVA) due to  embolism of cerebral artery    Pressure injury of skin 08/26/2021   Acute respiratory failure with hypoxia 08/25/2021   Acute metabolic encephalopathy 08/25/2021   Malignant hypertension 08/25/2021   Thrombocytopenia 08/25/2021   Anemia due to chronic kidney disease 08/25/2021   Blurry vision, bilateral 08/16/2021   Hyponatremia 04/01/2021   COVID-19 03/30/2021   Senile osteoporosis 02/10/2021   Macrocytic anemia    E coli enteritis 01/09/2021   Acute pancreatitis 01/08/2021   Acute renal failure superimposed on stage 4 chronic kidney disease 01/07/2021   Diarrhea    Acute hypokalemia 01/06/2021   Long-term use of immunosuppressant medication-anticipated 11/28/2020   Elevated blood-pressure reading, without diagnosis of hypertension 05/30/2020   Meningioma 05/30/2020   Headache 05/17/2020   Hypokalemia 05/17/2020   Hypomagnesemia 05/17/2020   Dehydration 05/17/2020   Hypertensive urgency 05/17/2020   Visual disturbance 05/17/2020   Left hip pain 09/22/2019   Lumbar radiculopathy 09/22/2019   Postoperative anemia due to acute blood loss 09/18/2019   Postoperative urinary retention 09/18/2019   Closed fracture of distal end of radius 05/26/2019   Coronary arteriosclerosis 07/15/2018   Closed right hip fracture 04/08/2018   Elevated lipase    Crohn's disease of small intestine without complication    Protein-calorie malnutrition, severe 03/05/2018   Femur fracture, right 03/30/2017   Neck pain 03/12/2017   Acute kidney injury superimposed on CKD 01/25/2016   Neuropathy 03/02/2015   Pain in the chest    Chest pain 12/22/2014   Essential hypertension 12/22/2014   Nausea & vomiting 12/22/2014   Uncontrolled type 2 diabetes mellitus with hyperglycemia, with long-term current use of insulin 12/22/2014   Hiatal hernia 12/22/2014   GERD (gastroesophageal reflux disease) 12/22/2014   Gout 12/22/2014   Mixed hyperlipidemia 12/22/2014   Depression 12/22/2014   Type 2 diabetes  mellitus without complications 12/22/2014   Fusion of spine, cervicothoracic region 11/01/2014   Pseudophakia, left eye 10/20/2014   Chronic kidney disease (CKD), stage IV (severe) 08/25/2014   Cervical stenosis of spine 08/26/2013   Vitamin B12 deficiency 07/08/2011   Personal history of failed moderate sedation 04/16/2011   Chronic Nausea and Vomiting - ? gastroapresis 04/16/2011   Early satiety 04/16/2011   Irritable bowel syndrome 04/16/2011   Benign paroxysmal positional vertigo 11/20/2010   TRICUSPID REGURGITATION, MODERATE 11/20/2010   PULMONARY HYPERTENSION, MILD 11/20/2010   LEG EDEMA, BILATERAL 11/20/2010   DYSPNEA 11/20/2010   Prolonged QT interval 11/20/2010   CHEST WALL PAIN, HX OF 11/20/2010   PCP:  [name], MD Pharmacy:   [facility] [address] [place] - [address] [place] [address] Phone: [phone] Fax: [phone]  [id number] - [place], [place] - [address] [place] [address] Phone: [phone] Fax: [phone]     Social Determinants of Health (SDOH) Social History: SDOH Screenings   Food Insecurity: No Food Insecurity (12/12/2022)  Housing: Low Risk  (12/12/2022)  Transportation Needs: No Transportation Needs (12/12/2022)  Utilities: Not At Risk (12/12/2022)  Tobacco Use: Low Risk  (02/04/2023)  SDOH Interventions:     Readmission Risk Interventions    02/06/2023    8:28 AM 11/14/2022   11:31 AM 11/12/2022    4:06 PM  Readmission Risk Prevention Plan  Transportation Screening Complete  Complete  Medication Review (RN Care Manager) Complete  Complete  PCP or Specialist appointment within 3-5 days of discharge  Complete Not Complete  HRI or Home Care Consult Complete  Complete  SW Recovery Care/Counseling Consult Complete  Complete  Palliative Care Screening Not Applicable  Not Applicable  Skilled Nursing Facility Not Applicable  Complete

## 2023-02-07 DIAGNOSIS — E1100 Type 2 diabetes mellitus with hyperosmolarity without nonketotic hyperglycemic-hyperosmolar coma (NKHHC): Secondary | ICD-10-CM | POA: Diagnosis not present

## 2023-02-07 LAB — COMPREHENSIVE METABOLIC PANEL
ALT: 17 U/L (ref 0–44)
AST: 25 U/L (ref 15–41)
Albumin: 1.7 g/dL — ABNORMAL LOW (ref 3.5–5.0)
Alkaline Phosphatase: 87 U/L (ref 38–126)
Anion gap: 7 (ref 5–15)
BUN: 78 mg/dL — ABNORMAL HIGH (ref 8–23)
CO2: 14 mmol/L — ABNORMAL LOW (ref 22–32)
Calcium: 7.5 mg/dL — ABNORMAL LOW (ref 8.9–10.3)
Chloride: 108 mmol/L (ref 98–111)
Creatinine, Ser: 2.23 mg/dL — ABNORMAL HIGH (ref 0.44–1.00)
GFR, Estimated: 23 mL/min — ABNORMAL LOW (ref >60)
Glucose, Bld: 159 mg/dL — ABNORMAL HIGH (ref 70–99)
Potassium: 3 mmol/L — ABNORMAL LOW (ref 3.5–5.1)
Sodium: 129 mmol/L — ABNORMAL LOW (ref 135–145)
Total Bilirubin: 0.6 mg/dL (ref 0.3–1.2)
Total Protein: 4.2 g/dL — ABNORMAL LOW (ref 6.5–8.1)

## 2023-02-07 LAB — CBC
HCT: 22.4 % — ABNORMAL LOW (ref 36.0–46.0)
Hemoglobin: 7.5 g/dL — ABNORMAL LOW (ref 12.0–15.0)
MCH: 31.1 pg (ref 26.0–34.0)
MCHC: 33.5 g/dL (ref 30.0–36.0)
MCV: 92.9 fL (ref 80.0–100.0)
Platelets: 116 10*3/uL — ABNORMAL LOW (ref 150–400)
RBC: 2.41 MIL/uL — ABNORMAL LOW (ref 3.87–5.11)
RDW: 14.2 % (ref 11.5–15.5)
WBC: 9.5 10*3/uL (ref 4.0–10.5)
nRBC: 0 % (ref 0.0–0.2)

## 2023-02-07 LAB — GLUCOSE, CAPILLARY
Glucose-Capillary: 119 mg/dL — ABNORMAL HIGH (ref 70–99)
Glucose-Capillary: 120 mg/dL — ABNORMAL HIGH (ref 70–99)
Glucose-Capillary: 137 mg/dL — ABNORMAL HIGH (ref 70–99)
Glucose-Capillary: 146 mg/dL — ABNORMAL HIGH (ref 70–99)
Glucose-Capillary: 148 mg/dL — ABNORMAL HIGH (ref 70–99)
Glucose-Capillary: 190 mg/dL — ABNORMAL HIGH (ref 70–99)
Glucose-Capillary: 201 mg/dL — ABNORMAL HIGH (ref 70–99)

## 2023-02-07 LAB — CBC WITH DIFFERENTIAL/PLATELET
Abs Immature Granulocytes: 0.05 10*3/uL (ref 0.00–0.07)
Basophils Absolute: 0 10*3/uL (ref 0.0–0.1)
Basophils Relative: 0 %
Eosinophils Absolute: 0 10*3/uL (ref 0.0–0.5)
Eosinophils Relative: 1 %
HCT: 22.5 % — ABNORMAL LOW (ref 36.0–46.0)
Hemoglobin: 7.4 g/dL — ABNORMAL LOW (ref 12.0–15.0)
Immature Granulocytes: 1 %
Lymphocytes Relative: 6 %
Lymphs Abs: 0.5 10*3/uL — ABNORMAL LOW (ref 0.7–4.0)
MCH: 30.7 pg (ref 26.0–34.0)
MCHC: 32.9 g/dL (ref 30.0–36.0)
MCV: 93.4 fL (ref 80.0–100.0)
Monocytes Absolute: 0.8 10*3/uL (ref 0.1–1.0)
Monocytes Relative: 10 %
Neutro Abs: 7.2 10*3/uL (ref 1.7–7.7)
Neutrophils Relative %: 82 %
Platelets: 125 10*3/uL — ABNORMAL LOW (ref 150–400)
RBC: 2.41 MIL/uL — ABNORMAL LOW (ref 3.87–5.11)
RDW: 14.2 % (ref 11.5–15.5)
WBC: 8.7 10*3/uL (ref 4.0–10.5)
nRBC: 0 % (ref 0.0–0.2)

## 2023-02-07 LAB — BASIC METABOLIC PANEL
Anion gap: 7 (ref 5–15)
BUN: 76 mg/dL — ABNORMAL HIGH (ref 8–23)
CO2: 14 mmol/L — ABNORMAL LOW (ref 22–32)
Calcium: 7.6 mg/dL — ABNORMAL LOW (ref 8.9–10.3)
Chloride: 108 mmol/L (ref 98–111)
Creatinine, Ser: 2.15 mg/dL — ABNORMAL HIGH (ref 0.44–1.00)
GFR, Estimated: 24 mL/min — ABNORMAL LOW (ref >60)
Glucose, Bld: 124 mg/dL — ABNORMAL HIGH (ref 70–99)
Potassium: 3.1 mmol/L — ABNORMAL LOW (ref 3.5–5.1)
Sodium: 129 mmol/L — ABNORMAL LOW (ref 135–145)

## 2023-02-07 LAB — IRON AND TIBC: Iron: 22 ug/dL — ABNORMAL LOW (ref 28–170)

## 2023-02-07 LAB — FOLATE: Folate: 6.3 ng/mL (ref >5.9)

## 2023-02-07 LAB — MAGNESIUM: Magnesium: 1.6 mg/dL — ABNORMAL LOW (ref 1.7–2.4)

## 2023-02-07 LAB — PHOSPHORUS: Phosphorus: 2.1 mg/dL — ABNORMAL LOW (ref 2.5–4.6)

## 2023-02-07 LAB — VITAMIN B12: Vitamin B-12: 2350 pg/mL — ABNORMAL HIGH (ref 180–914)

## 2023-02-07 LAB — FERRITIN: Ferritin: 1203 ng/mL — ABNORMAL HIGH (ref 11–307)

## 2023-02-07 MED ORDER — PROMETHAZINE HCL 12.5 MG PO TABS
25.0000 mg | ORAL_TABLET | Freq: Four times a day (QID) | ORAL | Status: DC | PRN
  Administered 2023-02-07 – 2023-02-11 (×8): 25 mg via ORAL
  Filled 2023-02-07 (×9): qty 2

## 2023-02-07 MED ORDER — PROMETHAZINE HCL 25 MG RE SUPP: 25.0000 mg | Freq: Four times a day (QID) | RECTAL | Status: DC | PRN

## 2023-02-07 MED ORDER — CHLORHEXIDINE GLUCONATE CLOTH 2 % EX PADS
6.0000 | MEDICATED_PAD | Freq: Every day | CUTANEOUS | Status: DC
  Administered 2023-02-07 – 2023-02-08 (×2): 6 via TOPICAL

## 2023-02-07 MED ORDER — MAGNESIUM SULFATE 2 GM/50ML IV SOLN
2.0000 g | Freq: Once | INTRAVENOUS | Status: AC
  Administered 2023-02-07: 2 g via INTRAVENOUS
  Filled 2023-02-07: qty 50

## 2023-02-07 MED ORDER — SODIUM BICARBONATE 8.4 % IV SOLN
INTRAVENOUS | Status: DC
  Filled 2023-02-07 (×7): qty 1000

## 2023-02-07 MED ORDER — OXYCODONE HCL 5 MG PO TABS
5.0000 mg | ORAL_TABLET | ORAL | Status: DC | PRN
  Administered 2023-02-07 – 2023-02-08 (×3): 5 mg via ORAL
  Filled 2023-02-07 (×3): qty 1

## 2023-02-07 MED ORDER — SODIUM CHLORIDE 0.9 % IV SOLN: 6.2500 mg | Freq: Four times a day (QID) | INTRAVENOUS | Status: DC | PRN

## 2023-02-07 MED ORDER — POTASSIUM CHLORIDE CRYS ER 20 MEQ PO TBCR
40.0000 meq | EXTENDED_RELEASE_TABLET | Freq: Once | ORAL | Status: AC
  Administered 2023-02-07: 40 meq via ORAL
  Filled 2023-02-07: qty 2

## 2023-02-07 MED ORDER — MORPHINE SULFATE (PF) 2 MG/ML IV SOLN
2.0000 mg | INTRAVENOUS | Status: DC | PRN
  Administered 2023-02-08: 2 mg via INTRAVENOUS
  Filled 2023-02-07 (×2): qty 1

## 2023-02-07 NOTE — Progress Notes (Addendum)
PROGRESS NOTE    Heather [name] Latishia Suitt Molla  ZOX:[id number] DOB: Jun 08, 1951 DOA: 02/04/2023 PCP: Kirstie PeriShah, Ashish, MD  Chief Complaint  Patient presents with   Emesis    Brief Narrative:   Heather [name] Karmah Potocki Nauta is Bradden Tadros 72 y.o. female with medical history significant of hypertension, T2DM, diastolic CHF, CKD stage IV who presents to the emergency department due to 3-day onset of nonbloody vomiting, abdominal pain (periumbilical) and poor oral intake.  Patient also complained of left flank pain and painful urination, she states that she was recently treated for UTI and that she just completed an antibiotic course within the past week.  She was admitted with hyperglycemia secondary to DKA/HHS and AKI on CKD stage IV with dehydration secondary to possible acute gastroenteritis.  She continues to complain of some abdominal pain and nausea.  Her abdominal discomfort and abdominal pain have continued.  Continue supportive care.  Low threshold for repeat imaging.    Assessment & Plan:   Principal Problem:   Hyperosmolar hyperglycemic state (HHS) Active Problems:   Uncontrolled type 2 diabetes mellitus with hyperglycemia, with long-term current use of insulin   Essential hypertension   Chronic diastolic CHF (congestive heart failure)   Prolonged QT interval   Hypertensive urgency   UTI (urinary tract infection)   Acute kidney injury superimposed on chronic kidney disease   Pseudohyponatremia   High anion gap metabolic acidosis   Poor intravenous access  acute gastroenteritis CT abdomen and pelvis was suggestive of acute gastroenteritis, this may be viral in nature She continues to have abdominal discomfort, poor PO intake Follow GI pathogen panel given lack of improvement to this point Consider reimaging if persistent Continue supportive care, pain meds, antiemetics, IVF  Hyperglycemia secondary to HHS Hyperglycemia secondary to poorly controlled type 2 diabetes mellitus Anion gap Metabolic  acidosis Resolved Now on SSI and lantus Heart healthy, carb mod diet   Hypertensive urgency Essential hypertension Continue metoprolol    Anemia Dilutional? Net positive 5 L  Repeat H/H, add anemia labs  Acute on chronic CKD stage IV  Non Anion Gap Metabolic Acidosis Dehydration Creatinine 3.19 (baseline creatinine at 1.8-2.3) Creatinine 2.2 today, follow with IVF using sodium bicarb given metabolic acidosis CT without hydro  Renally adjust medications, avoid nephrotoxic agents/dehydration/hypotension   Hyponatremia Follow with IVF   Constipation Large amount of stool throughout colon on imaging She's had 5 loose stools 4/10, related to gastroenteritis vs overflow diarrhea from constipation Monitor for now  UTI POA  Patient was recently treated for UTI She complained of dysuria and was empirically started on IV ceftriaxone, we shall continue same at this time with plan to de-escalate/discontinue based on urine culture -> yeast on urine culture, will stop abx and start fluconazole    Prolonged QT interval Qtc 497ms  Avoid QT prolonging drugs Repeat EKG 467   Chronic diastolic CHF (congestive heart failure) Stable and compensated.  Last echo 12/17/2022, EF of 60 to 65%.  No RWMA.  G1 DD   Central Line in Place RIJ Will try to establish peripheral access, d/c central line when able     DVT prophylaxis: heparin Code Status: full Family Communication: none Disposition:   Status is: Inpatient Remains inpatient appropriate because: pending further improvement, continued abdominal pain, inability to tolerate PO   Consultants:  none  Procedures:  none  Antimicrobials:  Anti-infectives (From admission, onward)    Start     Dose/Rate Route Frequency Ordered Stop   02/06/23 1915  fluconazole (DIFLUCAN) tablet 100  mg        100 mg Oral Daily 02/06/23 1826     02/05/23 2200  cefTRIAXone (ROCEPHIN) 1 g in sodium chloride 0.9 % 100 mL IVPB  Status:  Discontinued         1 g 200 mL/hr over 30 Minutes Intravenous Every 24 hours 02/05/23 0740 02/06/23 1816   02/05/23 0100  cefTRIAXone (ROCEPHIN) 1 g in sodium chloride 0.9 % 100 mL IVPB        1 g 200 mL/hr over 30 Minutes Intravenous  Once 02/05/23 0045 02/05/23 0201   02/05/23 0100  fluconazole (DIFLUCAN) tablet 150 mg        150 mg Oral  Once 02/05/23 0045 02/05/23 0120       Subjective: Continued abdominal pain and diarrhea  Objective: Vitals:   02/06/23 1545 02/06/23 2110 02/07/23 0333 02/07/23 1215  BP: 132/63 132/64 (!) 157/66 136/65  Pulse: 100 90 (!) 105 92  Resp: 17 16 18 17   Temp: 98.5 F (36.9 C) 98.1 F (36.7 C) 98.7 F (37.1 C) 98.6 F (37 C)  TempSrc: Oral Oral Oral Oral  SpO2: 98% 97% 100% 100%  Weight:      Height:        Intake/Output Summary (Last 24 hours) at 02/07/2023 1432 Last data filed at 02/07/2023 0500 Gross per 24 hour  Intake 563.95 ml  Output 250 ml  Net 313.95 ml   Filed Weights   02/04/23 1437  Weight: 49.9 kg    Examination:  General: No acute distress. Cardiovascular:RRR Lungs: unlabored Abdomen: mild diffuse abdominal discomfort - no rebound or guarding, nothing to suggest repeat imaging needed today Neurological: Alert and oriented 3. Moves all extremities 4 with equal strength. Cranial nerves II through XII grossly intact. Extremities: No clubbing or cyanosis. No edema.  Data Reviewed: I have personally reviewed following labs and imaging studies  CBC: Recent Labs  Lab 02/04/23 1711 02/06/23 0440 02/07/23 0835  WBC 16.8* 16.6* 8.7  NEUTROABS  --   --  7.2  HGB 11.2* 9.1* 7.4*  HCT 34.4* 27.0* 22.5*  MCV 94.8 90.6 93.4  PLT 285 211 125*    Basic Metabolic Panel: Recent Labs  Lab 02/04/23 1929 02/05/23 0906 02/05/23 1945 02/05/23 2321 02/06/23 0440 02/06/23 0823 02/07/23 0835  NA  --    < > 128* 127* 127* 128* 129*  K  --    < > 3.5 3.5 3.3* 3.3* 3.0*  CL  --    < > 101 101 103 103 108  CO2  --    < > 19* 17* 16* 17*  14*  GLUCOSE  --    < > 169* 199* 198* 156* 159*  BUN  --    < > 70* 71* 73* 75* 78*  CREATININE  --    < > 2.77* 2.74* 2.74* 2.72* 2.23*  CALCIUM  --    < > 8.2* 8.2* 8.0* 8.0* 7.5*  MG 1.7  --   --   --  1.3*  --  1.6*  PHOS  --   --   --   --  1.8*  --  2.1*   < > = values in this interval not displayed.    GFR: Estimated Creatinine Clearance: 18.5 mL/min (Ebelin Dillehay) (by C-G formula based on SCr of 2.23 mg/dL (H)).  Liver Function Tests: Recent Labs  Lab 02/04/23 1711 02/06/23 0440 02/07/23 0835  AST 17 19 25   ALT 11 12 17   ALKPHOS 144*  97 87  BILITOT 1.3* 0.7 0.6  PROT 6.7 4.9* 4.2*  ALBUMIN 3.1* 2.1* 1.7*    CBG: Recent Labs  Lab 02/06/23 1957 02/07/23 0005 02/07/23 0416 02/07/23 0708 02/07/23 1121  GLUCAP 78 120* 148* 146* 137*     Recent Results (from the past 240 hour(s))  Urine Culture     Status: Abnormal   Collection Time: 02/04/23 11:40 PM   Specimen: Urine, Clean Catch  Result Value Ref Range Status   Specimen Description   Final    URINE, CLEAN CATCH Performed at [facility], [address], [place], [place] [address]    Special Requests   Final    NONE Performed at [facility], [address], [place], [place] [address]    Culture 50,000 COLONIES/mL YEAST (Jannette Cotham)  Final   Report Status 02/06/2023 FINAL  Final         Radiology Studies: No results found.      Scheduled Meds:  Chlorhexidine Gluconate Cloth  6 each Topical Daily   fluconazole  100 mg Oral Daily   heparin  5,000 Units Subcutaneous Q8H   insulin aspart  0-9 Units Subcutaneous Q4H   insulin glargine-yfgn  7 Units Subcutaneous Daily   metoprolol succinate  37.5 mg Oral Daily   Continuous Infusions:  sodium chloride 75 mL/hr at 02/06/23 1932   promethazine (PHENERGAN) injection (IM or IVPB)       LOS: 2 days    Time spent: over 30 min    Lacretia Nicks, MD [facility]   To contact the attending provider between 7A-7P or the covering provider during after  hours 7P-7A, please log into the web site [email or web address] and access using universal [place] password for that web site. If you do not have the password, please call the hospital operator.  02/07/2023, 2:32 PM

## 2023-02-08 DIAGNOSIS — I10 Essential (primary) hypertension: Secondary | ICD-10-CM

## 2023-02-08 DIAGNOSIS — N179 Acute kidney failure, unspecified: Secondary | ICD-10-CM

## 2023-02-08 DIAGNOSIS — I5032 Chronic diastolic (congestive) heart failure: Secondary | ICD-10-CM | POA: Diagnosis not present

## 2023-02-08 DIAGNOSIS — D649 Anemia, unspecified: Secondary | ICD-10-CM

## 2023-02-08 DIAGNOSIS — E1100 Type 2 diabetes mellitus with hyperosmolarity without nonketotic hyperglycemic-hyperosmolar coma (NKHHC): Secondary | ICD-10-CM | POA: Diagnosis not present

## 2023-02-08 DIAGNOSIS — N390 Urinary tract infection, site not specified: Secondary | ICD-10-CM

## 2023-02-08 DIAGNOSIS — N189 Chronic kidney disease, unspecified: Secondary | ICD-10-CM

## 2023-02-08 LAB — COMPREHENSIVE METABOLIC PANEL
ALT: 30 U/L (ref 0–44)
AST: 39 U/L (ref 15–41)
Albumin: 1.6 g/dL — ABNORMAL LOW (ref 3.5–5.0)
Alkaline Phosphatase: 97 U/L (ref 38–126)
Anion gap: 8 (ref 5–15)
BUN: 75 mg/dL — ABNORMAL HIGH (ref 8–23)
CO2: 17 mmol/L — ABNORMAL LOW (ref 22–32)
Calcium: 7.7 mg/dL — ABNORMAL LOW (ref 8.9–10.3)
Chloride: 105 mmol/L (ref 98–111)
Creatinine, Ser: 2.2 mg/dL — ABNORMAL HIGH (ref 0.44–1.00)
GFR, Estimated: 24 mL/min — ABNORMAL LOW (ref >60)
Glucose, Bld: 153 mg/dL — ABNORMAL HIGH (ref 70–99)
Potassium: 3.5 mmol/L (ref 3.5–5.1)
Sodium: 130 mmol/L — ABNORMAL LOW (ref 135–145)
Total Bilirubin: 0.5 mg/dL (ref 0.3–1.2)
Total Protein: 4.1 g/dL — ABNORMAL LOW (ref 6.5–8.1)

## 2023-02-08 LAB — PREPARE RBC (CROSSMATCH)

## 2023-02-08 LAB — CBC WITH DIFFERENTIAL/PLATELET
Abs Immature Granulocytes: 0.11 10*3/uL — ABNORMAL HIGH (ref 0.00–0.07)
Basophils Absolute: 0 10*3/uL (ref 0.0–0.1)
Basophils Relative: 0 %
Eosinophils Absolute: 0.2 10*3/uL (ref 0.0–0.5)
Eosinophils Relative: 2 %
HCT: 20.2 % — ABNORMAL LOW (ref 36.0–46.0)
Hemoglobin: 6.7 g/dL — CL (ref 12.0–15.0)
Immature Granulocytes: 1 %
Lymphocytes Relative: 8 %
Lymphs Abs: 0.8 10*3/uL (ref 0.7–4.0)
MCH: 30.5 pg (ref 26.0–34.0)
MCHC: 33.2 g/dL (ref 30.0–36.0)
MCV: 91.8 fL (ref 80.0–100.0)
Monocytes Absolute: 0.8 10*3/uL (ref 0.1–1.0)
Monocytes Relative: 8 %
Neutro Abs: 7.8 10*3/uL — ABNORMAL HIGH (ref 1.7–7.7)
Neutrophils Relative %: 81 %
Platelets: 114 10*3/uL — ABNORMAL LOW (ref 150–400)
RBC: 2.2 MIL/uL — ABNORMAL LOW (ref 3.87–5.11)
RDW: 14.5 % (ref 11.5–15.5)
WBC: 9.6 10*3/uL (ref 4.0–10.5)
nRBC: 0 % (ref 0.0–0.2)

## 2023-02-08 LAB — GLUCOSE, CAPILLARY
Glucose-Capillary: 166 mg/dL — ABNORMAL HIGH (ref 70–99)
Glucose-Capillary: 139 mg/dL — ABNORMAL HIGH (ref 70–99)
Glucose-Capillary: 134 mg/dL — ABNORMAL HIGH (ref 70–99)
Glucose-Capillary: 85 mg/dL (ref 70–99)
Glucose-Capillary: 99 mg/dL (ref 70–99)
Glucose-Capillary: 147 mg/dL — ABNORMAL HIGH (ref 70–99)

## 2023-02-08 LAB — TYPE AND SCREEN
ABO/RH(D): O POS
Unit division: 0

## 2023-02-08 LAB — PHOSPHORUS: Phosphorus: 1.5 mg/dL — ABNORMAL LOW (ref 2.5–4.6)

## 2023-02-08 LAB — BPAM RBC

## 2023-02-08 LAB — MAGNESIUM: Magnesium: 2 mg/dL (ref 1.7–2.4)

## 2023-02-08 MED ORDER — SODIUM CHLORIDE 0.9% IV SOLUTION: Freq: Once | INTRAVENOUS | Status: DC

## 2023-02-08 MED ORDER — METOCLOPRAMIDE HCL 10 MG PO TABS
5.0000 mg | ORAL_TABLET | Freq: Three times a day (TID) | ORAL | Status: DC
  Administered 2023-02-08 – 2023-02-09 (×5): 5 mg via ORAL
  Filled 2023-02-08 (×6): qty 1

## 2023-02-08 MED ORDER — POTASSIUM PHOSPHATES 15 MMOLE/5ML IV SOLN
30.0000 mmol | Freq: Once | INTRAVENOUS | Status: AC
  Administered 2023-02-08: 30 mmol via INTRAVENOUS
  Filled 2023-02-08: qty 10

## 2023-02-08 NOTE — Assessment & Plan Note (Addendum)
Anemia of chronic disease.  Patient with worsening anemia with Hgb down to 6,7 with plt at 114, Plan to transfuse one unit PRBC today and follow up on iron panel.

## 2023-02-08 NOTE — Care Management Important Message (Signed)
Important Message  Patient Details  Name: Heather [name] MRN: [id number] Date of Birth: 1951/10/19   Medicare Important Message Given:  Other (see comment) (unable to reach by phone, copy mailed to address on file)     Corey Harold 02/08/2023, 4:06 PM

## 2023-02-08 NOTE — Assessment & Plan Note (Addendum)
No signs of heart failure exacerbation. Continue blood pressure control with metoprolol.  Hold on diuretic therapy.

## 2023-02-08 NOTE — Assessment & Plan Note (Signed)
Patient was placed on fluconazole for urinary yeast.

## 2023-02-08 NOTE — Hospital Course (Addendum)
Heather [name] was admitted to the hospital with the working diagnosis of hyperglycemia hyperosmolar non ketotic state.   72 yo female with the past medical history of hypertension, T2DM, heart failure, CKD stage IV who presented with nausea and vomiting for 3 days. Recently completed antibiotic therapy for urinary tract infection. On her initial physical examination her blood pressure was 91/52, HR 96, RR 20 and 02 saturation 100% on room air. Lungs with no wheezing or rales, heart with S1 and S2 present and rhythmic with no gallops, rubs or murmurs, abdomen with tenderness to palpation with no guarding, no lower extremity edema.   Patient was placed on IV fluid and IV insulin with improvement in her symptoms.   04/12 continue with nausea and vomiting, not able to tolerate po.

## 2023-02-08 NOTE — Assessment & Plan Note (Addendum)
CKD stage IV.  Metabolic acidosis, hypo P, hyponatremia.   Renal function with serum cr at 2,20 with K at 3,5 and serum bicarbonate at 17. P is 1,5 and anion gap 8,  Na 130.   Plan to continue with bicarb drip at 75 ml per and follow up renal function and electrolytes in am.

## 2023-02-08 NOTE — TOC Transition Note (Signed)
Transition of Care [facility]) - CM/SW Discharge Note   Patient Details  Name: Heather [name] MRN: [id number] Date of Birth: 07-03-1951  [facility] Care [facility]) CM/SW Contact:  Villa Herb, LCSWA Phone Number: 02/08/2023, 3:26 PM   Clinical Narrative:    CSW updated pt may D/C home today. CSW updated Kandee Keen with [facility] of possible D/C. CSW made MD aware of need for [facility] PT/OT/RN orders. TOC signing off.   Final next level of care: Home w Home Health Services Barriers to Discharge: Barriers Resolved   Patient Goals and CMS Choice   Choice offered to / list presented to : Spouse  Discharge Placement                         Discharge Plan and Services Additional resources added to the After Visit Summary for   In-house Referral: Clinical Social Work   Post Acute Care Choice: Resumption of Svcs/PTA Provider                    HH Arranged: RN, PT, OT [facility] Agency: [facility] Date [facility] Agency Contacted: 02/08/23 Time HH Agency Contacted: [phone] Representative spoke with at [facility] Agency: Kandee Keen  Social Determinants of Health (SDOH) Interventions SDOH Screenings   Food Insecurity: No Food Insecurity (12/12/2022)  Housing: Low Risk  (12/12/2022)  Transportation Needs: No Transportation Needs (12/12/2022)  Utilities: Not At Risk (12/12/2022)  Tobacco Use: Low Risk  (02/04/2023)     Readmission Risk Interventions    02/06/2023    8:28 AM 11/14/2022   11:31 AM 11/12/2022    4:06 PM  Readmission Risk Prevention Plan  Transportation Screening Complete  Complete  Medication Review [employer]) Complete  Complete  PCP or Specialist appointment within 3-5 days of discharge  Complete Not Complete  HRI or Home Care Consult Complete  Complete  SW Recovery Care/Counseling Consult Complete  Complete  Palliative Care Screening Not Applicable  Not Applicable  Skilled Nursing Facility Not Applicable  Complete

## 2023-02-08 NOTE — Progress Notes (Signed)
  Progress Note   Patient: Heather [name] [id number] DOB: 04/04/1951 DOA: 02/04/2023     3 DOS: the patient was seen and examined on 02/08/2023   Brief hospital course: Mrs. Scheckel was admitted to the hospital with the working diagnosis of hyperglycemia hyperosmolar non ketotic state.   72 yo female with the past medical history of hypertension, T2DM, heart failure, CKD stage IV who presented with nausea and vomiting for 3 days. Recently completed antibiotic therapy for urinary tract infection. On her initial physical examination her blood pressure was 91/52, HR 96, RR 20 and 02 saturation 100% on room air. Lungs with no wheezing or rales, heart with S1 and S2 present and rhythmic with no gallops, rubs or murmurs, abdomen with tenderness to palpation with no guarding, no lower extremity edema.   Patient was placed on IV fluid and IV insulin with improvement in her symptoms.   04/12 continue with nausea and vomiting, not able to tolerate po.   Assessment and Plan: * Hyperosmolar hyperglycemic state (HHS) Patient was placed on IV insulin with improvement in glucose, Now she has been transitioned to sq insulin.  Her fasting glucose today was 153 mg/dl.  She continue to have nausea and vomiting and not able to tolerate po well.   Plan to continue basal and sliding scale insulin.  Will change metoclopramide to before meals for suspected gastroparesis.  Continue antiacid therapy.   Chronic diastolic CHF (congestive heart failure) No signs of heart failure exacerbation. Continue blood pressure control with metoprolol.  Hold on diuretic therapy.   Essential hypertension Continue blood pressure control with carvedilol. Hypertensive urgency has resolved.   Acute kidney injury superimposed on chronic kidney disease CKD stage IV.  Metabolic acidosis, hypo P, hyponatremia.   Renal function with serum cr at 2,20 with K at 3,5 and serum bicarbonate at 17. P is 1,5 and anion gap 8,   Na 130.   Plan to continue with bicarb drip at 75 ml per and follow up renal function and electrolytes in am.   UTI (urinary tract infection) Patient was placed on fluconazole for urinary yeast.   Normocytic anemia Anemia of chronic disease.  Patient with worsening anemia with Hgb down to 6,7 with plt at 114, Plan to transfuse one unit PRBC today and follow up on iron panel.        Subjective: Patient continue to have nausea, and not tolerating po well, continue very weak and deconditioned   Physical Exam: Vitals:   02/07/23 1215 02/07/23 2035 02/08/23 0455 02/08/23 1154  BP: 136/65 (!) 123/53 (!) 151/57 (!) 150/39  Pulse: 92 82 98 88  Resp: 17 16 16 18   Temp: 98.6 F (37 C) 98 F (36.7 C) (!) 97.5 F (36.4 C) 100.1 F (37.8 C)  TempSrc: Oral Oral  Axillary  SpO2: 100% 99% 100% 99%  Weight:      Height:       Neurology awake and alert ENT with pallor Cardiovascular with S1 and S2 present and rhythmic with no gallops, rubs or murmurs Respiratory with no rales or wheezing, no rhonchi Abdomen with no distention  No lower extremity edema  Data Reviewed:    Family Communication: no family at the bedside   Disposition: Status is: Inpatient Remains inpatient appropriate because: renal failure, anemia   Planned Discharge Destination: Home  Author: Coralie Keens, MD 02/08/2023 5:34 PM  For on call review [email or web address].

## 2023-02-08 NOTE — Assessment & Plan Note (Addendum)
Continue blood pressure control with carvedilol. Hypertensive urgency has resolved.

## 2023-02-08 NOTE — Assessment & Plan Note (Signed)
Patient was placed on IV insulin with improvement in glucose, Now she has been transitioned to sq insulin.  Her fasting glucose today was 153 mg/dl.  She continue to have nausea and vomiting and not able to tolerate po well.   Plan to continue basal and sliding scale insulin.  Will change metoclopramide to before meals for suspected gastroparesis.  Continue antiacid therapy.

## 2023-02-08 NOTE — Progress Notes (Signed)
   02/08/23 0630  Provider Notification  Provider Name/Title Georges Mouse Mickle Mallory  Date Provider Notified 02/08/23  Time Provider Notified [phone]  Method of Notification Page  Notification Reason Critical Result  Test performed and critical result Hemoglobin 6.7  Date Critical Result Received 02/08/23  Time Critical Result Received 0630  Provider response No new orders (asked if the patient had been typed and screened for possible blood)  Date of Provider Response 02/08/23  Time of Provider Response [phone]

## 2023-02-09 DIAGNOSIS — N179 Acute kidney failure, unspecified: Secondary | ICD-10-CM | POA: Diagnosis not present

## 2023-02-09 DIAGNOSIS — E1100 Type 2 diabetes mellitus with hyperosmolarity without nonketotic hyperglycemic-hyperosmolar coma (NKHHC): Secondary | ICD-10-CM | POA: Diagnosis not present

## 2023-02-09 DIAGNOSIS — N390 Urinary tract infection, site not specified: Secondary | ICD-10-CM | POA: Diagnosis not present

## 2023-02-09 DIAGNOSIS — I5032 Chronic diastolic (congestive) heart failure: Secondary | ICD-10-CM | POA: Diagnosis not present

## 2023-02-09 LAB — GLUCOSE, CAPILLARY
Glucose-Capillary: 109 mg/dL — ABNORMAL HIGH (ref 70–99)
Glucose-Capillary: 118 mg/dL — ABNORMAL HIGH (ref 70–99)
Glucose-Capillary: 182 mg/dL — ABNORMAL HIGH (ref 70–99)
Glucose-Capillary: 192 mg/dL — ABNORMAL HIGH (ref 70–99)
Glucose-Capillary: 196 mg/dL — ABNORMAL HIGH (ref 70–99)
Glucose-Capillary: 220 mg/dL — ABNORMAL HIGH (ref 70–99)

## 2023-02-09 LAB — RENAL FUNCTION PANEL
Albumin: 1.5 g/dL — ABNORMAL LOW (ref 3.5–5.0)
Anion gap: 6 (ref 5–15)
BUN: 75 mg/dL — ABNORMAL HIGH (ref 8–23)
CO2: 21 mmol/L — ABNORMAL LOW (ref 22–32)
Calcium: 7.4 mg/dL — ABNORMAL LOW (ref 8.9–10.3)
Chloride: 102 mmol/L (ref 98–111)
Creatinine, Ser: 1.98 mg/dL — ABNORMAL HIGH (ref 0.44–1.00)
GFR, Estimated: 27 mL/min — ABNORMAL LOW
Glucose, Bld: 98 mg/dL (ref 70–99)
Phosphorus: 3.8 mg/dL (ref 2.5–4.6)
Potassium: 3.7 mmol/L (ref 3.5–5.1)
Sodium: 129 mmol/L — ABNORMAL LOW (ref 135–145)

## 2023-02-09 LAB — CBC
HCT: 23.6 % — ABNORMAL LOW (ref 36.0–46.0)
Hemoglobin: 7.8 g/dL — ABNORMAL LOW (ref 12.0–15.0)
MCH: 30.2 pg (ref 26.0–34.0)
MCHC: 33.1 g/dL (ref 30.0–36.0)
MCV: 91.5 fL (ref 80.0–100.0)
Platelets: 101 10*3/uL — ABNORMAL LOW (ref 150–400)
RBC: 2.58 MIL/uL — ABNORMAL LOW (ref 3.87–5.11)
RDW: 14.3 % (ref 11.5–15.5)
WBC: 8.7 10*3/uL (ref 4.0–10.5)
nRBC: 0 % (ref 0.0–0.2)

## 2023-02-09 MED ORDER — ATORVASTATIN CALCIUM 40 MG PO TABS
80.0000 mg | ORAL_TABLET | Freq: Every day | ORAL | Status: DC
  Administered 2023-02-09 – 2023-02-10 (×2): 80 mg via ORAL
  Filled 2023-02-09 (×2): qty 2

## 2023-02-09 MED ORDER — PANCRELIPASE (LIP-PROT-AMYL) 12000-38000 UNITS PO CPEP
36000.0000 [IU] | ORAL_CAPSULE | Freq: Three times a day (TID) | ORAL | Status: DC
  Administered 2023-02-09 – 2023-02-11 (×8): 36000 [IU] via ORAL
  Filled 2023-02-09 (×8): qty 3

## 2023-02-09 MED ORDER — PANTOPRAZOLE SODIUM 40 MG PO TBEC
40.0000 mg | DELAYED_RELEASE_TABLET | Freq: Every day | ORAL | Status: DC
  Administered 2023-02-09 – 2023-02-11 (×3): 40 mg via ORAL
  Filled 2023-02-09 (×3): qty 1

## 2023-02-09 MED ORDER — VENLAFAXINE HCL ER 75 MG PO CP24
150.0000 mg | ORAL_CAPSULE | Freq: Every day | ORAL | Status: DC
  Administered 2023-02-09 – 2023-02-11 (×3): 150 mg via ORAL
  Filled 2023-02-09 (×3): qty 2

## 2023-02-09 MED ORDER — OXYCODONE HCL 5 MG PO TABS
5.0000 mg | ORAL_TABLET | ORAL | Status: DC | PRN
  Administered 2023-02-09 – 2023-02-11 (×6): 5 mg via ORAL
  Filled 2023-02-09 (×7): qty 1

## 2023-02-09 MED ORDER — ALLOPURINOL 100 MG PO TABS
100.0000 mg | ORAL_TABLET | Freq: Every day | ORAL | Status: DC
  Administered 2023-02-09 – 2023-02-11 (×3): 100 mg via ORAL
  Filled 2023-02-09 (×3): qty 1

## 2023-02-09 MED ORDER — FENTANYL CITRATE PF 50 MCG/ML IJ SOSY
12.5000 ug | PREFILLED_SYRINGE | INTRAMUSCULAR | Status: DC | PRN
  Administered 2023-02-09: 12.5 ug via INTRAVENOUS
  Filled 2023-02-09: qty 1

## 2023-02-09 MED ORDER — GABAPENTIN 300 MG PO CAPS
300.0000 mg | ORAL_CAPSULE | Freq: Every day | ORAL | Status: DC
  Administered 2023-02-09 – 2023-02-10 (×2): 300 mg via ORAL
  Filled 2023-02-09 (×2): qty 1

## 2023-02-09 MED ORDER — LEVETIRACETAM 500 MG PO TABS
500.0000 mg | ORAL_TABLET | Freq: Two times a day (BID) | ORAL | Status: DC
  Administered 2023-02-09 – 2023-02-11 (×5): 500 mg via ORAL
  Filled 2023-02-09 (×5): qty 1

## 2023-02-09 NOTE — Plan of Care (Signed)
  Problem: Acute Rehab PT Goals(only PT should resolve) Goal: Pt Will Go Supine/Side To Sit Outcome: Progressing Flowsheets (Taken 02/09/2023 1354) Pt will go Supine/Side to Sit: with moderate assist Goal: Patient Will Transfer Sit To/From Stand Outcome: Progressing Flowsheets (Taken 02/09/2023 1354) Patient will transfer sit to/from stand: with moderate assist Goal: Pt Will Transfer Bed To Chair/Chair To Bed Outcome: Progressing Flowsheets (Taken 02/09/2023 1354) Pt will Transfer Bed to Chair/Chair to Bed: with mod assist Goal: Pt Will Ambulate Outcome: Progressing Flowsheets (Taken 02/09/2023 1354) Pt will Ambulate:  10 feet  with moderate assist  with rolling walker   1:55 PM, 02/09/23 Ocie Bob, MPT Physical Therapist with [facility] [phone] office [phone] mobile phone

## 2023-02-09 NOTE — Progress Notes (Addendum)
PROGRESS NOTE   Heather [name]  QZE:[id number] DOB: 13-Jul-1951 DOA: 02/04/2023 PCP: [name], MD   Chief Complaint  Patient presents with   Emesis   Level of care: Telemetry  Brief Admission History:  72 y.o. female with medical history significant of hypertension, T2DM, diastolic CHF, CKD stage IV who presents to the emergency department due to 3-day onset of nonbloody vomiting, abdominal pain (periumbilical) and poor oral intake.  Patient also complained of left flank pain and painful urination, she states that she was recently treated for UTI and that she just completed an antibiotic course within the past week.   ED Course:  In the emergency department, temperature on arrival was 96.8F, respiratory rate was 20/min, pulse 96 bpm, BP 91/52, O2 sat 100% on room air.  Workup in the ED showed WBC of 16.8, hemoglobin 11.2, hematocrit 34.4, MCV 94.8, platelets 25.  BMP showed sodium 122, potassium 4.6, chloride 88, bicarb 17, blood glucose 889, BUN 67, creatinine 3.19 (baseline creatinine at 1.8-2.3), GFR 15, anion gap 17, ALP 144, albumin 3.1.  Urinalysis was positive for glycosuria, proteinuria and hematuria. CT abdomen and pelvis without contrast showed 1. Moderate fluid distention of the stomach. There are some fluid-filled jejunal loops without definite dilatation or transition point. Findings may be related to gastroenteritis. 2. Large amount of stool throughout the colon. 3. 18 mm low-density lesion in the tail of the pancreas, not seen on prior. This can be further characterized with MRI. 4. Severe atherosclerotic disease of the aorta and peripheral vessels. 5. Subcentimeter left Bosniak I benign renal cyst. No follow-up imaging is recommended. Patient was started on IV ceftriaxone and Diflucan.  IV fentanyl was given due to abdominal pain.  Reglan was given, IV hydration was provided and patient was started on insulin drip per Endo tool.  Hospitalist was asked admit patient for  further evaluation and management.   Assessment and Plan: Hyperosmolar hyperglycemic state - TREATED AND RESOLVED  Patient was placed on IV insulin with improvement in glucose, Now she has been transitioned to sq insulin. CBG (last 3)  Recent Labs    02/09/23 0347 02/09/23 0752 02/09/23 1117  GLUCAP 118* 109* 192*  DC central line today 4/13    Diabetic Gastroparesis  continue metoclopramide to before meals   Continue antiacid therapy.    HFpEF  No signs of heart failure exacerbation. Continue blood pressure control with metoprolol.  Hold on diuretic therapy.    Hypertensive Urgency / Essential hypertension Continue blood pressure control with carvedilol. Hypertensive urgency has resolved.    Acute kidney injury superimposed on chronic kidney disease stage IV CKD stage IV.  Renal function slowly improving   Metabolic acidosis - resolving with IV sodium bicarbonate infusion     Gastroenteritis  - still having frequent BMs - fecal management system installed 4/13   UTI (urinary tract infection)  DC fluconazole     Normocytic anemia Anemia of chronic disease.  Patient with worsening anemia with Hgb down to 6,7 with plt at 114, S/p 1 unit PRBC 4/12 .   Epilepsy - resumed home keppra BID   DVT prophylaxis: [place] heparin  Code Status: Full  Family Communication:  Disposition: Status is: Inpatient Remains inpatient appropriate because: IV fluid required    Consultants:   Procedures:   Subjective: Pt c/o pain on sacrum lying in bed. Appetite a little better today.   Objective: Vitals:   02/08/23 2134 02/08/23 2149 02/08/23 2345 02/09/23 0337  BP: 129/68 (!) 161/64 Marland Kitchen)  140/53 (!) 158/77  Pulse: 90 91 79 96  Resp: 16 11 10 14   Temp: (!) 97.5 F (36.4 C) 97.7 F (36.5 C) 97.6 F (36.4 C) 97.6 F (36.4 C)  TempSrc: Oral Oral Oral Oral  SpO2: 99% 100% 99% 97%  Weight:      Height:        Intake/Output Summary (Last 24 hours) at 02/09/2023 1429 Last data  filed at 02/09/2023 0500 Gross per 24 hour  Intake 3304.08 ml  Output 400 ml  Net 2904.08 ml   Filed Weights   02/04/23 1437  Weight: 49.9 kg   Examination:  General exam: chronically ill appearing, pale, Appears calm and comfortable  Respiratory system: shallow breathing bilateral. Respiratory effort normal. Cardiovascular system: normal S1 & S2 heard. No JVD, murmurs, rubs, gallops or clicks. No pedal edema. Gastrointestinal system: Abdomen is nondistended, soft and nontender. No organomegaly or masses felt. Normal bowel sounds heard. Central nervous system: Alert and oriented. No focal neurological deficits. Extremities: Symmetric 5 x 5 power. Skin: No rashes, lesions or ulcers. Psychiatry: Judgement and insight appear poor. Mood & affect anxious and depressed.   Data Reviewed: I have personally reviewed following labs and imaging studies  CBC: Recent Labs  Lab 02/06/23 0440 02/07/23 0835 02/07/23 1508 02/08/23 0444 02/09/23 0541  WBC 16.6* 8.7 9.5 9.6 8.7  NEUTROABS  --  7.2  --  7.8*  --   HGB 9.1* 7.4* 7.5* 6.7* 7.8*  HCT 27.0* 22.5* 22.4* 20.2* 23.6*  MCV 90.6 93.4 92.9 91.8 91.5  PLT 211 125* 116* 114* 101*    Basic Metabolic Panel: Recent Labs  Lab 02/04/23 1929 02/05/23 0906 02/06/23 0440 02/06/23 0823 02/07/23 0835 02/07/23 1508 02/08/23 0444 02/09/23 0541  NA  --    < > 127* 128* 129* 129* 130* 129*  K  --    < > 3.3* 3.3* 3.0* 3.1* 3.5 3.7  CL  --    < > 103 103 108 108 105 102  CO2  --    < > 16* 17* 14* 14* 17* 21*  GLUCOSE  --    < > 198* 156* 159* 124* 153* 98  BUN  --    < > 73* 75* 78* 76* 75* 75*  CREATININE  --    < > 2.74* 2.72* 2.23* 2.15* 2.20* 1.98*  CALCIUM  --    < > 8.0* 8.0* 7.5* 7.6* 7.7* 7.4*  MG 1.7  --  1.3*  --  1.6*  --  2.0  --   PHOS  --   --  1.8*  --  2.1*  --  1.5* 3.8   < > = values in this interval not displayed.    CBG: Recent Labs  Lab 02/08/23 2007 02/08/23 2321 02/09/23 0347 02/09/23 0752 02/09/23 1117   GLUCAP 99 147* 118* 109* 192*    Recent Results (from the past 240 hour(s))  Urine Culture     Status: Abnormal   Collection Time: 02/04/23 11:40 PM   Specimen: Urine, Clean Catch  Result Value Ref Range Status   Specimen Description   Final    URINE, CLEAN CATCH Performed at [facility], [address], [place], [place] [address]    Special Requests   Final    NONE Performed at [facility], [address] [place], [place] [address]    Culture 50,000 COLONIES/mL YEAST (A)  Final   Report Status 02/06/2023 FINAL  Final     Radiology Studies:  No results found.  Scheduled Meds:  sodium chloride   Intravenous Once   allopurinol  100 mg Oral Daily   atorvastatin  80 mg Oral QHS   Chlorhexidine Gluconate Cloth  6 each Topical Daily   gabapentin  300 mg Oral QHS   heparin  5,000 Units Subcutaneous Q8H   insulin aspart  0-9 Units Subcutaneous Q4H   insulin glargine-yfgn  7 Units Subcutaneous Daily   levETIRAcetam  500 mg Oral BID   lipase/protease/amylase  36,000 Units Oral TID WC   metoCLOPramide  5 mg Oral TID AC & HS   metoprolol succinate  37.5 mg Oral Daily   pantoprazole  40 mg Oral Daily   venlafaxine XR  150 mg Oral Daily   Continuous Infusions:  promethazine (PHENERGAN) injection (IM or IVPB)     sodium bicarbonate 150 mEq in dextrose 5 % 1,150 mL infusion 75 mL/hr at 02/09/23 0649    LOS: 4 days   Time spent: 36 mins  Jalexia Lalli, MD How to contact the [facility] Attending or Consulting provider 7A - 7P or covering provider during after hours 7P -7A, for this patient?  Check the care team in [facility] and look for a) attending/consulting TRH provider listed and b) the [facility] team listed Log into [email or web address] and use [place]'s universal password to access. If you do not have the password, please contact the hospital operator. Locate the [facility] provider you are looking for under [facility] and page to a number that you can be directly reached. If you still have  difficulty reaching the provider, please page the [facility] (Director on Call) for the Hospitalists listed on amion for assistance.  02/09/2023, 2:29 PM

## 2023-02-09 NOTE — TOC Progression Note (Signed)
Transition of Care [facility]) - Progression Note    Patient Details  Name: Heather [name] MRN: [id number] Date of Birth: May 10, 1951  Transition of Care [facility]) CM/SW Contact  Catalina Gravel, [place] Phone Number: 02/09/2023, 12:51 PM  Clinical Narrative:    CSW informed SNF recommended by PT.  Contacted pt /spouse. Family agreeable and first choice [facility],previously there. CSW completed FL2. Auth initiated.    Expected Discharge Plan: Home w Home Health Services Barriers to Discharge: Barriers Resolved  Expected Discharge Plan and Services In-house Referral: Clinical Social Work   Post Acute Care Choice: Resumption of Svcs/PTA Provider Living arrangements for the past 2 months: Single Family Home                           HH Arranged: RN, PT, OT [facility] Agency: [facility] Date [facility] Agency Contacted: 02/08/23 Time HH Agency Contacted: [phone] Representative spoke with at [facility] Agency: Kandee Keen   Social Determinants of Health (SDOH) Interventions SDOH Screenings   Food Insecurity: No Food Insecurity (12/12/2022)  Housing: Low Risk  (12/12/2022)  Transportation Needs: No Transportation Needs (12/12/2022)  Utilities: Not At Risk (12/12/2022)  Tobacco Use: Low Risk  (02/04/2023)    Readmission Risk Interventions    02/06/2023    8:28 AM 11/14/2022   11:31 AM 11/12/2022    4:06 PM  Readmission Risk Prevention Plan  Transportation Screening Complete  Complete  Medication Review Oceanographer) Complete  Complete  PCP or Specialist appointment within 3-5 days of discharge  Complete Not Complete  HRI or Home Care Consult Complete  Complete  SW Recovery Care/Counseling Consult Complete  Complete  Palliative Care Screening Not Applicable  Not Applicable  Skilled Nursing Facility Not Applicable  Complete

## 2023-02-09 NOTE — Evaluation (Signed)
Physical Therapy Evaluation Patient Details Name: Heather [name] MRN: [id number] DOB: 1951/10/08 Today's Date: 02/09/2023  History of Present Illness  Heather [name] is a 72 y.o. female with medical history significant of hypertension, T2DM, diastolic CHF, CKD stage IV who presents to the emergency department due to 3-day onset of nonbloody vomiting, abdominal pain (periumbilical) and poor oral intake.  Patient also complained of left flank pain and painful urination, she states that she was recently treated for UTI and that she just completed an antibiotic course within the past week.   Clinical Impression  Patient very apprehensive for sitting up due to pain over buttocks, but cooperative after encouragement.  Patient demonstrates slow labored movement for sitting up at bedside, very unsteady on feet due to knees buckling using RW and unable to fully extend trunk due to weakness. Patient limited to a few side steps before having to sit due to weakness/fall risk and tolerated sitting up in chair after therapy - nursing staff aware.  Patient will benefit from continued skilled physical therapy in hospital and recommended venue below to increase strength, balance, endurance for safe ADLs and gait.         Recommendations for follow up therapy are one component of a multi-disciplinary discharge planning process, led by the attending physician.  Recommendations may be updated based on patient status, additional functional criteria and insurance authorization.  Follow Up Recommendations Can patient physically be transported by private vehicle: No     Assistance Recommended at Discharge Intermittent Supervision/Assistance  Patient can return home with the following  A lot of help with bathing/dressing/bathroom;A lot of help with walking and/or transfers;Help with stairs or ramp for entrance;Assistance with cooking/housework    Equipment Recommendations None recommended by PT   Recommendations for Other Services       Functional Status Assessment Patient has had a recent decline in their functional status and demonstrates the ability to make significant improvements in function in a reasonable and predictable amount of time.     Precautions / Restrictions Precautions Precautions: Fall Restrictions Weight Bearing Restrictions: No      Mobility  Bed Mobility Overal bed mobility: Needs Assistance Bed Mobility: Supine to Sit     Supine to sit: Mod assist, Max assist     General bed mobility comments: increased time, labored movement    Transfers Overall transfer level: Needs assistance Equipment used: Rolling walker (2 wheels) Transfers: Sit to/from Stand, Bed to chair/wheelchair/BSC Sit to Stand: Mod assist   Step pivot transfers: Mod assist, Max assist       General transfer comment: buckling of knees due to weakness    Ambulation/Gait Ambulation/Gait assistance: Max assist Gait Distance (Feet): 3 Feet Assistive device: Rolling walker (2 wheels) Gait Pattern/deviations: Decreased step length - right, Decreased step length - left, Decreased stride length, Trunk flexed, Knees buckling, Shuffle Gait velocity: slow     General Gait Details: limited to a few shuffling side steps with buckling of knees due to weakness  Stairs            Wheelchair Mobility    Modified Rankin (Stroke Patients Only)       Balance Overall balance assessment: Needs assistance Sitting-balance support: Feet supported, No upper extremity supported Sitting balance-Leahy Scale: Fair Sitting balance - Comments: seated at EOB   Standing balance support: During functional activity, Bilateral upper extremity supported Standing balance-Leahy Scale: Poor Standing balance comment: using RW  Pertinent Vitals/Pain Pain Assessment Pain Assessment: Faces Faces Pain Scale: Hurts whole lot Pain Location: over buttocks  when sitting, pressure Pain Descriptors / Indicators: Sore, Grimacing, Guarding Pain Intervention(s): Limited activity within patient's tolerance, Monitored during session, Repositioned    Home Living Family/patient expects to be discharged to:: Private residence Living Arrangements: Spouse/significant other Available Help at Discharge: Available PRN/intermittently Type of Home: House Home Access: Stairs to enter Entrance Stairs-Rails: None Entrance Stairs-Number of Steps: 1   Home Layout: One level Home Equipment: Shower seat;Wheelchair Financial trader (4 wheels)      Prior Function Prior Level of Function : Needs assist       Physical Assist : Mobility (physical);ADLs (physical) Mobility (physical): Gait;Bed mobility;Transfers;Stairs   Mobility Comments: household ambulator using RW with assistance from spouse. ADLs Comments: assisted by family     Hand Dominance   Dominant Hand: Right    Extremity/Trunk Assessment   Upper Extremity Assessment Upper Extremity Assessment: Generalized weakness    Lower Extremity Assessment Lower Extremity Assessment: Generalized weakness    Cervical / Trunk Assessment Cervical / Trunk Assessment: Normal  Communication   Communication: No difficulties  Cognition Arousal/Alertness: Awake/alert Behavior During Therapy: WFL for tasks assessed/performed Overall Cognitive Status: Within Functional Limits for tasks assessed                                          General Comments      Exercises     Assessment/Plan    PT Assessment Patient needs continued PT services  PT Problem List Decreased strength;Decreased activity tolerance;Decreased balance;Decreased mobility;Pain       PT Treatment Interventions DME instruction;Gait training;Stair training;Functional mobility training;Therapeutic activities;Patient/family education;Balance training    PT Goals (Current goals can be found in the Care Plan section)   Acute Rehab PT Goals Patient Stated Goal: return home after rehab PT Goal Formulation: With patient Time For Goal Achievement: 02/23/23 Potential to Achieve Goals: Fair    Frequency Min 3X/week     Co-evaluation               AM-PAC PT "6 Clicks" Mobility  Outcome Measure Help needed turning from your back to your side while in a flat bed without using bedrails?: A Lot Help needed moving from lying on your back to sitting on the side of a flat bed without using bedrails?: A Lot Help needed moving to and from a bed to a chair (including a wheelchair)?: A Lot Help needed standing up from a chair using your arms (e.g., wheelchair or bedside chair)?: A Lot Help needed to walk in hospital room?: A Lot Help needed climbing 3-5 steps with a railing? : Total 6 Click Score: 11    End of Session   Activity Tolerance: Patient tolerated treatment well;Patient limited by fatigue;Patient limited by pain Patient left: in chair;with call bell/phone within reach Nurse Communication: Mobility status PT Visit Diagnosis: Unsteadiness on feet (R26.81);Other abnormalities of gait and mobility (R26.89);Muscle weakness (generalized) (M62.81)    Time: 1610-9604 PT Time Calculation (min) (ACUTE ONLY): 25 min   Charges:   PT Evaluation $PT Eval Moderate Complexity: 1 Mod PT Treatments $Therapeutic Activity: 23-37 mins        1:53 PM, 02/09/23 Ocie Bob, MPT Physical Therapist with [facility] [phone] office [phone] mobile phone

## 2023-02-09 NOTE — [place] FL2 (Signed)
[place] MEDICAID FL2 LEVEL OF CARE FORM     IDENTIFICATION  Patient Name: ICESS [name] Birthdate: 1951/01/28 Sex: female Admission Date (Current Location): 02/04/2023  [place] and [place] Number:  Aaron Edelman [id number] [facility] Facility and Address:  [facility],  [address] [place] [address]      Provider Number:    Attending Physician Name and Address:  Cleora Fleet, MD  Relative Name and Phone Number:  Sheldon Amara Sr [phone]    Current Level of Care: Hospital Recommended Level of Care: Skilled Nursing Facility Prior Approval Number:    Date Approved/Denied:   PASRR Number: [id number]  Discharge Plan: SNF    Current Diagnoses: Patient Active Problem List   Diagnosis Date Noted   Hyperosmolar hyperglycemic state (HHS) 02/05/2023   High anion gap metabolic acidosis 02/05/2023   Poor intravenous access 02/05/2023   Fracture 12/11/2022   Pseudohyponatremia 11/12/2022   Thrombocytosis 11/12/2022   DKA (diabetic ketoacidosis) 11/12/2022   Uncontrolled type 2 diabetes mellitus with hyperglycemia 11/11/2022   Acute kidney injury superimposed on chronic kidney disease 10/17/2022   DKA, type 2 10/16/2022   History of right shoulder fracture 10/15/2022   Aortic atherosclerosis 08/01/2022   Recurrent falls 07/04/2022   Humeral head fracture 07/04/2022   Right leg weakness 06/20/2022   Anxiety 06/20/2022   UTI (urinary tract infection) 06/20/2022   Malnutrition of moderate degree 06/20/2022   Hyperkalemia 06/19/2022   Type 2 diabetes mellitus with hyperosmolar nonketotic hyperglycemia 05/27/2022   Acute kidney failure 03/17/2022   Mixed diabetic hyperlipidemia associated with type 2 diabetes mellitus 03/16/2022   Chronic diastolic CHF (congestive heart failure) 03/16/2022   Decubitus ulcer of left buttock, stage 2 03/16/2022   Cellulitis of left hand 11/30/2021   Closed fracture of part of upper end of humerus 11/30/2021   At risk  for falls 11/28/2021   Neoplasm of meninges 11/28/2021   Sepsis 11/28/2021   Normocytic anemia 11/28/2021   Cerebral thrombosis with cerebral infarction 08/28/2021   Cerebrovascular accident (CVA) due to embolism of cerebral artery    Pressure injury of skin 08/26/2021   Acute respiratory failure with hypoxia 08/25/2021   Acute metabolic encephalopathy 08/25/2021   Malignant hypertension 08/25/2021   Thrombocytopenia 08/25/2021   Anemia due to chronic kidney disease 08/25/2021   Blurry vision, bilateral 08/16/2021   Hyponatremia 04/01/2021   COVID-19 03/30/2021   Senile osteoporosis 02/10/2021   Macrocytic anemia    E coli enteritis 01/09/2021   Acute pancreatitis 01/08/2021   Acute renal failure superimposed on stage 4 chronic kidney disease 01/07/2021   Diarrhea    Acute hypokalemia 01/06/2021   Long-term use of immunosuppressant medication-anticipated 11/28/2020   Elevated blood-pressure reading, without diagnosis of hypertension 05/30/2020   Meningioma 05/30/2020   Headache 05/17/2020   Hypokalemia 05/17/2020   Hypomagnesemia 05/17/2020   Dehydration 05/17/2020   Hypertensive urgency 05/17/2020   Visual disturbance 05/17/2020   Left hip pain 09/22/2019   Lumbar radiculopathy 09/22/2019   Postoperative anemia due to acute blood loss 09/18/2019   Postoperative urinary retention 09/18/2019   Closed fracture of distal end of radius 05/26/2019   Coronary arteriosclerosis 07/15/2018   Closed right hip fracture 04/08/2018   Elevated lipase    Crohn's disease of small intestine without complication    Protein-calorie malnutrition, severe 03/05/2018   Femur fracture, right 03/30/2017   Neck pain 03/12/2017   Acute kidney injury superimposed on CKD 01/25/2016   Neuropathy 03/02/2015   Pain in the chest  Chest pain 12/22/2014   Essential hypertension 12/22/2014   Nausea & vomiting 12/22/2014   Uncontrolled type 2 diabetes mellitus with hyperglycemia, with long-term current  use of insulin 12/22/2014   Hiatal hernia 12/22/2014   GERD (gastroesophageal reflux disease) 12/22/2014   Gout 12/22/2014   Mixed hyperlipidemia 12/22/2014   Depression 12/22/2014   Type 2 diabetes mellitus without complications 12/22/2014   Fusion of spine, cervicothoracic region 11/01/2014   Pseudophakia, left eye 10/20/2014   Chronic kidney disease (CKD), stage IV (severe) 08/25/2014   Cervical stenosis of spine 08/26/2013   Vitamin B12 deficiency 07/08/2011   Personal history of failed moderate sedation 04/16/2011   Chronic Nausea and Vomiting - ? gastroapresis 04/16/2011   Early satiety 04/16/2011   Irritable bowel syndrome 04/16/2011   Benign paroxysmal positional vertigo 11/20/2010   TRICUSPID REGURGITATION, MODERATE 11/20/2010   PULMONARY HYPERTENSION, MILD 11/20/2010   LEG EDEMA, BILATERAL 11/20/2010   DYSPNEA 11/20/2010   Prolonged QT interval 11/20/2010   CHEST WALL PAIN, HX OF 11/20/2010    Orientation RESPIRATION BLADDER Height & Weight     Self, Time, Situation, Place  Normal Continent Weight: 110 lb (49.9 kg) Height:  5\' 4"  (162.6 cm)  BEHAVIORAL SYMPTOMS/MOOD NEUROLOGICAL BOWEL NUTRITION STATUS      Continent Diet  AMBULATORY STATUS COMMUNICATION OF NEEDS Skin   Limited Assist Verbally PU Stage and Appropriate Care PU Stage 1 Dressing: Daily                     Personal Care Assistance Level of Assistance  Bathing, Feeding, Dressing Bathing Assistance: Maximum assistance Feeding assistance: Limited assistance Dressing Assistance: Maximum assistance     Functional Limitations Info   (adequate)          SPECIAL CARE FACTORS FREQUENCY  PT (By licensed PT), OT (By licensed OT)     PT Frequency: 5 X a week OT Frequency: 5 X times a week            Contractures      Additional Factors Info  Code Status Code Status Info: Full             Current Medications (02/09/2023):  This is the current hospital active medication list Current  Facility-Administered Medications  Medication Dose Route Frequency Provider Last Rate Last Admin   0.9 %  sodium chloride infusion (Manually program via Guardrails IV Fluids)   Intravenous Once Arrien, York Ram, MD       allopurinol (ZYLOPRIM) tablet 100 mg  100 mg Oral Daily Johnson, Clanford L, MD   100 mg at 02/09/23 1056   atorvastatin (LIPITOR) tablet 80 mg  80 mg Oral QHS Johnson, Clanford L, MD       Chlorhexidine Gluconate Cloth 2 % PADS 6 each  6 each Topical Daily Zigmund Daniel., MD   6 each at 02/08/23 0829   dextrose 50 % solution 0-50 mL  0-50 mL Intravenous PRN Gloris Manchester, MD       fentaNYL (SUBLIMAZE) injection 12.5 mcg  12.5 mcg Intravenous Q2H PRN Johnson, Clanford L, MD   12.5 mcg at 02/09/23 0851   gabapentin (NEURONTIN) capsule 300 mg  300 mg Oral QHS Johnson, Clanford L, MD       heparin injection 5,000 Units  5,000 Units Subcutaneous Q8H Adefeso, Oladapo, DO   5,000 Units at 02/09/23 0831   insulin aspart (novoLOG) injection 0-9 Units  0-9 Units Subcutaneous Q4H Shah, Pratik D, DO   2 Units  at 02/09/23 1221   insulin glargine-yfgn [facility]) injection 7 Units  7 Units Subcutaneous Daily Maurilio Lovely D, DO   7 Units at 02/09/23 1056   levETIRAcetam (KEPPRA) tablet 500 mg  500 mg Oral BID Johnson, Clanford L, MD   500 mg at 02/09/23 1056   lipase/protease/amylase (CREON) capsule 36,000 Units  36,000 Units Oral TID WC Johnson, Clanford L, MD   36,000 Units at 02/09/23 1221   metoCLOPramide (REGLAN) tablet 5 mg  5 mg Oral TID AC & HS Arrien, York Ram, MD   5 mg at 02/09/23 1221   metoprolol succinate (TOPROL-XL) 24 hr tablet 37.5 mg  37.5 mg Oral Daily Adefeso, Oladapo, DO   37.5 mg at 02/09/23 0831   oxyCODONE (Oxy IR/ROXICODONE) immediate release tablet 5 mg  5 mg Oral Q4H PRN Johnson, Clanford L, MD   5 mg at 02/09/23 1222   pantoprazole (PROTONIX) EC tablet 40 mg  40 mg Oral Daily Johnson, Clanford L, MD   40 mg at 02/09/23 1056   polyethylene glycol  (MIRALAX / GLYCOLAX) packet 17 g  17 g Oral BID PRN Zigmund Daniel., MD       promethazine (PHENERGAN) tablet 25 mg  25 mg Oral Q6H PRN Zigmund Daniel., MD   25 mg at 02/09/23 2993   Or   promethazine (PHENERGAN) 6.25 mg in sodium chloride 0.9 % 50 mL IVPB  6.25 mg Intravenous Q6H PRN Zigmund Daniel., MD       Or   promethazine (PHENERGAN) suppository 25 mg  25 mg Rectal Q6H PRN Zigmund Daniel., MD       sodium bicarbonate 150 mEq in dextrose 5 % 1,150 mL infusion   Intravenous Continuous Arrien, York Ram, MD 75 mL/hr at 02/09/23 0649 New Bag at 02/09/23 0649   venlafaxine XR (EFFEXOR-XR) 24 hr capsule 150 mg  150 mg Oral Daily Johnson, Clanford L, MD   150 mg at 02/09/23 1056     Discharge Medications: Please see discharge summary for a list of discharge medications.  Relevant Imaging Results:  Relevant Lab Results:   Additional Information    Sarin [place] I [place] Lights, LCSW

## 2023-02-09 NOTE — Progress Notes (Signed)
Patient received I unit of PRBC's last night tolerated well, c/o nausea persisted through night medicated with PRN antiemetic that had little effect. Patient rendered incontinence care for large BM repositioned for comfort offloading sacrum.

## 2023-02-10 DIAGNOSIS — I10 Essential (primary) hypertension: Secondary | ICD-10-CM | POA: Diagnosis not present

## 2023-02-10 DIAGNOSIS — N179 Acute kidney failure, unspecified: Secondary | ICD-10-CM | POA: Diagnosis not present

## 2023-02-10 DIAGNOSIS — I5032 Chronic diastolic (congestive) heart failure: Secondary | ICD-10-CM | POA: Diagnosis not present

## 2023-02-10 DIAGNOSIS — E1100 Type 2 diabetes mellitus with hyperosmolarity without nonketotic hyperglycemic-hyperosmolar coma (NKHHC): Secondary | ICD-10-CM | POA: Diagnosis not present

## 2023-02-10 LAB — TYPE AND SCREEN: Antibody Screen: NEGATIVE

## 2023-02-10 LAB — GLUCOSE, CAPILLARY
Glucose-Capillary: 126 mg/dL — ABNORMAL HIGH (ref 70–99)
Glucose-Capillary: 124 mg/dL — ABNORMAL HIGH (ref 70–99)
Glucose-Capillary: 155 mg/dL — ABNORMAL HIGH (ref 70–99)
Glucose-Capillary: 91 mg/dL (ref 70–99)
Glucose-Capillary: 72 mg/dL (ref 70–99)

## 2023-02-10 LAB — BASIC METABOLIC PANEL
Anion gap: 10 (ref 5–15)
BUN: 74 mg/dL — ABNORMAL HIGH (ref 8–23)
CO2: 23 mmol/L (ref 22–32)
Calcium: 7.8 mg/dL — ABNORMAL LOW (ref 8.9–10.3)
Chloride: 100 mmol/L (ref 98–111)
Creatinine, Ser: 1.87 mg/dL — ABNORMAL HIGH (ref 0.44–1.00)
GFR, Estimated: 29 mL/min — ABNORMAL LOW (ref >60)
Glucose, Bld: 121 mg/dL — ABNORMAL HIGH (ref 70–99)
Potassium: 3.6 mmol/L (ref 3.5–5.1)
Sodium: 133 mmol/L — ABNORMAL LOW (ref 135–145)

## 2023-02-10 LAB — CBC
HCT: 26 % — ABNORMAL LOW (ref 36.0–46.0)
Hemoglobin: 8.2 g/dL — ABNORMAL LOW (ref 12.0–15.0)
MCH: 30.1 pg (ref 26.0–34.0)
MCHC: 31.5 g/dL (ref 30.0–36.0)
MCV: 95.6 fL (ref 80.0–100.0)
Platelets: 99 10*3/uL — ABNORMAL LOW (ref 150–400)
RBC: 2.72 MIL/uL — ABNORMAL LOW (ref 3.87–5.11)
RDW: 14.4 % (ref 11.5–15.5)
WBC: 8.7 10*3/uL (ref 4.0–10.5)
nRBC: 0 % (ref 0.0–0.2)

## 2023-02-10 LAB — BPAM RBC
Blood Product Expiration Date: 202405222359
ISSUE DATE / TIME: 202404122127
Unit Type and Rh: 5100

## 2023-02-10 MED ORDER — INSULIN ASPART 100 UNIT/ML IJ SOLN
0.0000 [IU] | Freq: Three times a day (TID) | INTRAMUSCULAR | Status: DC
  Administered 2023-02-10: 2 [IU] via SUBCUTANEOUS

## 2023-02-10 MED ORDER — SODIUM BICARBONATE 650 MG PO TABS
650.0000 mg | ORAL_TABLET | Freq: Two times a day (BID) | ORAL | Status: DC
  Administered 2023-02-11: 650 mg via ORAL
  Filled 2023-02-10: qty 1

## 2023-02-10 MED ORDER — METOCLOPRAMIDE HCL 10 MG PO TABS
5.0000 mg | ORAL_TABLET | Freq: Three times a day (TID) | ORAL | Status: DC | PRN
  Administered 2023-02-10: 5 mg via ORAL

## 2023-02-10 NOTE — Progress Notes (Signed)
PROGRESS NOTE   Heather [name]  ZOX:[id number] DOB: Feb 27, 1951 DOA: 02/04/2023 PCP: [name], MD   Chief Complaint  Patient presents with   Emesis   Level of care: Med-Surg  Brief Admission History:  72 y.o. female with medical history significant of hypertension, T2DM, diastolic CHF, CKD stage IV who presents to the emergency department due to 3-day onset of nonbloody vomiting, abdominal pain (periumbilical) and poor oral intake.  Patient also complained of left flank pain and painful urination, she states that she was recently treated for UTI and that she just completed an antibiotic course within the past week.   ED Course:  In the emergency department, temperature on arrival was 96.66F, respiratory rate was 20/min, pulse 96 bpm, BP 91/52, O2 sat 100% on room air.  Workup in the ED showed WBC of 16.8, hemoglobin 11.2, hematocrit 34.4, MCV 94.8, platelets 25.  BMP showed sodium 122, potassium 4.6, chloride 88, bicarb 17, blood glucose 889, BUN 67, creatinine 3.19 (baseline creatinine at 1.8-2.3), GFR 15, anion gap 17, ALP 144, albumin 3.1.  Urinalysis was positive for glycosuria, proteinuria and hematuria. CT abdomen and pelvis without contrast showed 1. Moderate fluid distention of the stomach. There are some fluid-filled jejunal loops without definite dilatation or transition point. Findings may be related to gastroenteritis. 2. Large amount of stool throughout the colon. 3. 18 mm low-density lesion in the tail of the pancreas, not seen on prior. This can be further characterized with MRI. 4. Severe atherosclerotic disease of the aorta and peripheral vessels. 5. Subcentimeter left Bosniak I benign renal cyst. No follow-up imaging is recommended. Patient was started on IV ceftriaxone and Diflucan.  IV fentanyl was given due to abdominal pain.  Reglan was given, IV hydration was provided and patient was started on insulin drip per Endo tool.  Hospitalist was asked admit patient for  further evaluation and management.   Assessment and Plan: Hyperosmolar hyperglycemic state - TREATED AND RESOLVED  Patient was placed on IV insulin with improvement in glucose, Now she has been transitioned to sq insulin. CBG (last 3)  Recent Labs    02/10/23 0346 02/10/23 0719 02/10/23 1116  GLUCAP 126* 124* 155*  DC'd central line 4/13    Diabetic Gastroparesis  continue metoclopramide to before meals   Continue antiacid therapy.    HFpEF  No signs of heart failure exacerbation. Continue blood pressure control with metoprolol.  Hold on diuretic therapy. Plan to resume some diuretic therapy at discharge.     Hypertensive Urgency / Essential hypertension Continue blood pressure control with carvedilol. Hypertensive urgency has resolved.    Acute kidney injury superimposed on chronic kidney disease stage IV CKD stage IV.  Renal function slowly improved with IV fluid hydration   Metabolic acidosis - resolved with IV sodium bicarbonate infusion   - start daily bicarbonate tablet 4/15    Gastroenteritis  - still having frequent BMs - fecal management system discontinued 4/14   UTI (urinary tract infection)  DC fluconazole     Normocytic anemia Anemia of chronic disease.  Patient with worsening anemia with Hgb down to 6,7 with plt at 114, S/p 1 unit PRBC 4/12 .   Epilepsy - resumed home keppra BID   DVT prophylaxis: [place] heparin  Code Status: Full  Family Communication:  Disposition: SNF 4/15 if continues to improve  Remains inpatient appropriate because: IV fluid required    Consultants:   Procedures:   Subjective: Pt reports feeling better today.   Objective: Vitals:  02/09/23 0337 02/09/23 1550 02/09/23 2044 02/10/23 0413  BP: (!) 158/77 (!) 138/56 (!) 152/52 (!) 122/44  Pulse: 96 92 89 86  Resp: 14 20 20 18   Temp: 97.6 F (36.4 C) 97.9 F (36.6 C) 98.7 F (37.1 C) 98.8 F (37.1 C)  TempSrc: Oral Oral Oral Oral  SpO2: 97% 98% 100% 99%  Weight:       Height:        Intake/Output Summary (Last 24 hours) at 02/10/2023 1312 Last data filed at 02/10/2023 0900 Gross per 24 hour  Intake 240 ml  Output --  Net 240 ml   Filed Weights   02/04/23 1437  Weight: 49.9 kg   Examination:  General exam: chronically ill appearing, pale, Appears calm and comfortable  Respiratory system: shallow breathing bilateral. Respiratory effort normal. Cardiovascular system: normal S1 & S2 heard. No JVD, murmurs, rubs, gallops or clicks. No pedal edema. Gastrointestinal system: Abdomen is nondistended, soft and nontender. No organomegaly or masses felt. Normal bowel sounds heard. Central nervous system: Alert and oriented. No focal neurological deficits. Extremities: Symmetric 5 x 5 power. Skin: No rashes, lesions or ulcers. Psychiatry: Judgement and insight appear poor. Mood & affect anxious and depressed.   Data Reviewed: I have personally reviewed following labs and imaging studies  CBC: Recent Labs  Lab 02/07/23 0835 02/07/23 1508 02/08/23 0444 02/09/23 0541 02/10/23 0413  WBC 8.7 9.5 9.6 8.7 8.7  NEUTROABS 7.2  --  7.8*  --   --   HGB 7.4* 7.5* 6.7* 7.8* 8.2*  HCT 22.5* 22.4* 20.2* 23.6* 26.0*  MCV 93.4 92.9 91.8 91.5 95.6  PLT 125* 116* 114* 101* 99*    Basic Metabolic Panel: Recent Labs  Lab 02/04/23 1929 02/05/23 0906 02/06/23 0440 02/06/23 0823 02/07/23 0835 02/07/23 1508 02/08/23 0444 02/09/23 0541 02/10/23 0413  NA  --    < > 127*   < > 129* 129* 130* 129* 133*  K  --    < > 3.3*   < > 3.0* 3.1* 3.5 3.7 3.6  CL  --    < > 103   < > 108 108 105 102 100  CO2  --    < > 16*   < > 14* 14* 17* 21* 23  GLUCOSE  --    < > 198*   < > 159* 124* 153* 98 121*  BUN  --    < > 73*   < > 78* 76* 75* 75* 74*  CREATININE  --    < > 2.74*   < > 2.23* 2.15* 2.20* 1.98* 1.87*  CALCIUM  --    < > 8.0*   < > 7.5* 7.6* 7.7* 7.4* 7.8*  MG 1.7  --  1.3*  --  1.6*  --  2.0  --   --   PHOS  --   --  1.8*  --  2.1*  --  1.5* 3.8  --    <  > = values in this interval not displayed.    CBG: Recent Labs  Lab 02/09/23 2037 02/09/23 2337 02/10/23 0346 02/10/23 0719 02/10/23 1116  GLUCAP 196* 182* 126* 124* 155*    Recent Results (from the past 240 hour(s))  Urine Culture     Status: Abnormal   Collection Time: 02/04/23 11:40 PM   Specimen: Urine, Clean Catch  Result Value Ref Range Status   Specimen Description   Final    URINE, CLEAN CATCH Performed at [facility],  [address], [place], [place] [address]    Special Requests   Final    NONE Performed at [facility], [address], [place], [place] [address]    Culture 50,000 COLONIES/mL YEAST (A)  Final   Report Status 02/06/2023 FINAL  Final     Radiology Studies: No results found.  Scheduled Meds:  sodium chloride   Intravenous Once   allopurinol  100 mg Oral Daily   atorvastatin  80 mg Oral QHS   gabapentin  300 mg Oral QHS   insulin aspart  0-9 Units Subcutaneous TID WC   insulin glargine-yfgn  7 Units Subcutaneous Daily   levETIRAcetam  500 mg Oral BID   lipase/protease/amylase  36,000 Units Oral TID WC   metoprolol succinate  37.5 mg Oral Daily   pantoprazole  40 mg Oral Daily   [START ON 02/11/2023] sodium bicarbonate  650 mg Oral BID   venlafaxine XR  150 mg Oral Daily   Continuous Infusions:  promethazine (PHENERGAN) injection (IM or IVPB)      LOS: 5 days   Time spent: 35 mins  Heather Cookston Laural Benes, MD How to contact the [facility] Attending or Consulting provider 7A - 7P or covering provider during after hours 7P -7A, for this patient?  Check the care team in [facility] and look for a) attending/consulting TRH provider listed and b) the [facility] team listed Log into [email or web address] and use [facility] universal password to access. If you do not have the password, please contact the hospital operator. Locate the [facility] provider you are looking for under [facility] and page to a number that you can be directly reached. If you still have difficulty  reaching the provider, please page the [facility] (Director on Call) for the Hospitalists listed on amion for assistance.  02/10/2023, 1:12 PM

## 2023-02-11 ENCOUNTER — Other Ambulatory Visit: Payer: Self-pay | Admitting: *Deleted

## 2023-02-11 DIAGNOSIS — I10 Essential (primary) hypertension: Secondary | ICD-10-CM | POA: Diagnosis not present

## 2023-02-11 DIAGNOSIS — E1100 Type 2 diabetes mellitus with hyperosmolarity without nonketotic hyperglycemic-hyperosmolar coma (NKHHC): Secondary | ICD-10-CM | POA: Diagnosis not present

## 2023-02-11 DIAGNOSIS — I5032 Chronic diastolic (congestive) heart failure: Secondary | ICD-10-CM | POA: Diagnosis not present

## 2023-02-11 DIAGNOSIS — E1110 Type 2 diabetes mellitus with ketoacidosis without coma: Secondary | ICD-10-CM

## 2023-02-11 DIAGNOSIS — N179 Acute kidney failure, unspecified: Secondary | ICD-10-CM | POA: Diagnosis not present

## 2023-02-11 LAB — GLUCOSE, CAPILLARY
Glucose-Capillary: 93 mg/dL (ref 70–99)
Glucose-Capillary: 107 mg/dL — ABNORMAL HIGH (ref 70–99)

## 2023-02-11 MED ORDER — INSULIN ASPART 100 UNIT/ML FLEXPEN: 2.0000 [IU] | PEN_INJECTOR | Freq: Three times a day (TID) | SUBCUTANEOUS | 3 refills | Status: DC

## 2023-02-11 MED ORDER — METOPROLOL SUCCINATE ER 25 MG PO TB24: 37.5000 mg | ORAL_TABLET | Freq: Every day | ORAL | 1 refills | Status: DC

## 2023-02-11 MED ORDER — INSULIN GLARGINE-YFGN 100 UNIT/ML ~~LOC~~ SOPN: 7.0000 [IU] | PEN_INJECTOR | Freq: Every day | SUBCUTANEOUS | Status: DC

## 2023-02-11 MED ORDER — SODIUM BICARBONATE 650 MG PO TABS: 650.0000 mg | ORAL_TABLET | Freq: Every day | ORAL | 1 refills | Status: DC

## 2023-02-11 NOTE — Discharge Summary (Signed)
Physician Discharge Summary  Heather [name] NFA:[id number] DOB: 1951-09-17 DOA: 02/04/2023  PCP: [name], MD Nephrologist: Dr. Wolfgang Phoenix Cardiology: E. Philis Nettle NP  Admit date: 02/04/2023 Discharge date: 02/11/2023  Admitted From:  Home  Disposition: Home with HH (Declines SNF offer)  Recommendations for Outpatient Follow-up:  Follow up with PCP in 1 weeks Follow up with cardiology in 2 weeks  Follow up with nephrology in 2 weeks  Please obtain BMP/CBC in 1-2 weeks to follow up  Please test blood glucose 4 times per day   Home Health: PT/OT/RN   Discharge Condition: STABLE   CODE STATUS: FULL  DIET: Heart Healthy Carb Modified    Brief Hospitalization Summary: Please see all hospital notes, images, labs for full details of the hospitalization. ADMISSION HPI:  72 y.o. female with medical history significant of hypertension, T2DM, diastolic CHF, CKD stage IV who presents to the emergency department due to 3-day onset of nonbloody vomiting, abdominal pain (periumbilical) and poor oral intake.  Patient also complained of left flank pain and painful urination, she states that she was recently treated for UTI and that she just completed an antibiotic course within the past week.   ED Course:  In the emergency department, temperature on arrival was 96.53F, respiratory rate was 20/min, pulse 96 bpm, BP 91/52, O2 sat 100% on room air.  Workup in the ED showed WBC of 16.8, hemoglobin 11.2, hematocrit 34.4, MCV 94.8, platelets 25.  BMP showed sodium 122, potassium 4.6, chloride 88, bicarb 17, blood glucose 889, BUN 67, creatinine 3.19 (baseline creatinine at 1.8-2.3), GFR 15, anion gap 17, ALP 144, albumin 3.1.  Urinalysis was positive for glycosuria, proteinuria and hematuria. CT abdomen and pelvis without contrast showed 1. Moderate fluid distention of the stomach. There are some fluid-filled jejunal loops without definite dilatation or transition point. Findings may be related to  gastroenteritis. 2. Large amount of stool throughout the colon. 3. 18 mm low-density lesion in the tail of the pancreas, not seen on prior. This can be further characterized with MRI. 4. Severe atherosclerotic disease of the aorta and peripheral vessels. 5. Subcentimeter left Bosniak I benign renal cyst. No follow-up imaging is recommended. Patient was started on IV ceftriaxone and Diflucan.  IV fentanyl was given due to abdominal pain.  Reglan was given, IV hydration was provided and patient was started on insulin drip per Endo tool.  Hospitalist was asked admit patient for further evaluation and management.  HOSPITAL COURSE BY PROBLEM LIST   Hyperosmolar hyperglycemic state - TREATED AND RESOLVED  Patient was placed on IV insulin with improvement in glucose, Now she has been transitioned to sq insulin. CBG (last 3)  Recent Labs    02/10/23 2020 02/11/23 0733 02/11/23 1124  GLUCAP 72 93 107*   DC'd central line 4/13    Diabetic Gastroparesis  Treated with metoclopramide to before meals, stopped due to frequent BMs.    Continue antiacid therapy.    HFpEF  No signs of heart failure exacerbation. Continue blood pressure control with metoprolol.  Hold on diuretic therapy. She seems to be euvolemic at this time.    Hypertensive Urgency / Essential hypertension Continue blood pressure control with metoprolol XL 37.5 mg daily  Hypertensive urgency has resolved.    Acute kidney injury superimposed on chronic kidney disease stage IV CKD stage IV.  Renal function slowly improved with IV fluid hydration    Metabolic acidosis - resolved with IV sodium bicarbonate infusion   - started daily bicarbonate tablet 4/15  Gastroenteritis  - much improved after supportive measures  - fecal management system discontinued 4/14    UTI (urinary tract infection) - TREATED  DC fluconazole     Normocytic anemia Anemia of chronic disease.  Patient with worsening anemia with Hgb down to 6,7  with plt at 114, S/p 1 unit PRBC 4/12 .  Hg 8.2/HCT 26.0   Epilepsy - resumed home keppra BID    DVT prophylaxis: [place] heparin  Code Status: Full  Family Communication:  Disposition: SNF declined by pt/husband, home with home health services  Remains inpatient appropriate because: IV fluid required      Discharge Diagnoses:  Principal Problem:   Hyperosmolar hyperglycemic state (HHS) Active Problems:   Essential hypertension   Normocytic anemia   Chronic diastolic CHF (congestive heart failure)   UTI (urinary tract infection)   Acute kidney injury superimposed on chronic kidney disease   Discharge Instructions:  Allergies as of 02/11/2023       Reactions   Cozaar [losartan] Nausea And Vomiting, Swelling, Rash   Quinolones Itching, Nausea And Vomiting, Nausea Only, Swelling   Swelling of face, jaw, and lips   Cymbalta [duloxetine Hcl] Itching, Swelling   Sulfa Antibiotics Nausea And Vomiting, Swelling   Headache  Swelling of feet, legs   Synalar [fluocinolone] Swelling   Cipro [ciprofloxacin Hcl] Nausea And Vomiting, Swelling   Swelling of face, jaw, lips   Diovan [valsartan] Nausea And Vomiting, Swelling   Glucophage [metformin] Other (See Comments)   GI upset   Ketalar [ketamine] Swelling   Norco [hydrocodone-acetaminophen] Itching, Swelling   Norvasc [amlodipine Besylate] Itching, Swelling   Fluid retention    Nsaids Nausea And Vomiting, Other (See Comments)   Has bleeding ulcers   Tape Itching, Rash   Paper tape can only be used on this patient   Zestril [lisinopril] Swelling, Rash   Oral swelling Red streaks on arms/legs        Medication List     STOP taking these medications    apixaban 2.5 MG Tabs tablet Commonly known as: Eliquis   hydrALAZINE 50 MG tablet Commonly known as: APRESOLINE   methocarbamol 750 MG tablet Commonly known as: ROBAXIN       TAKE these medications    acetaminophen 325 MG tablet Commonly known as: TYLENOL Take 2  tablets (650 mg total) by mouth every 4 (four) hours as needed for mild pain, moderate pain, fever or headache.   allopurinol 100 MG tablet Commonly known as: ZYLOPRIM Take 100 mg by mouth daily.   atorvastatin 80 MG tablet Commonly known as: LIPITOR Take 1 tablet (80 mg total) by mouth daily. Restart on 06/29/22 What changed: when to take this   gabapentin 300 MG capsule Commonly known as: NEURONTIN Take 1 capsule by mouth at bedtime.   HYDROcodone-acetaminophen 5-325 MG tablet Commonly known as: NORCO/VICODIN Take 1 tablet by mouth every 6 (six) hours as needed.   insulin aspart 100 UNIT/ML FlexPen Commonly known as: NOVOLOG Inject 2 Units into the skin 3 (three) times daily with meals. Give only if eats 50% or more of meal. What changed: how much to take   insulin glargine-yfgn 100 UNIT/ML Pen Commonly known as: SEMGLEE Inject 7 Units into the skin daily. What changed: See the new instructions.   levETIRAcetam 500 MG tablet Commonly known as: KEPPRA Take 500 mg by mouth 2 (two) times daily.   lipase/protease/amylase [address] UNITS Cpep capsule Commonly known as: CREON Take 36,000 Units by mouth 3 (three) times  daily with meals.   metoprolol succinate 25 MG 24 hr tablet Commonly known as: TOPROL-XL Take 1.5 tablets (37.5 mg total) by mouth daily.   naloxone 4 MG/0.1ML Liqd nasal spray kit Commonly known as: NARCAN Place 1 spray into the nose once.   NexIUM 40 MG capsule Generic drug: esomeprazole Take 40 mg by mouth 2 (two) times daily before a meal.   ondansetron 4 MG disintegrating tablet Commonly known as: ZOFRAN-ODT Take 4 mg by mouth every 6 (six) hours as needed.   sodium bicarbonate 650 MG tablet Take 1 tablet (650 mg total) by mouth daily.   venlafaxine XR 150 MG 24 hr capsule Commonly known as: EFFEXOR-XR Take 150 mg by mouth daily.        Follow-up Information     [name], MD. Schedule an appointment as soon as possible for a visit in 1  week(s).   Specialty: Internal Medicine Why: Hospital Follow Up Contact information: [address]  [place] [address] [phone]         Sharlene Dory, NP. Schedule an appointment as soon as possible for a visit in 2 week(s).   Specialty: Cardiology Why: Hospital Follow Up Contact information: [address] [place] [address] [phone]         Randa Lynn, MD. Schedule an appointment as soon as possible for a visit in 2 week(s).   Specialty: Nephrology Why: Hospital Follow Up Contact information: [address] [place] [address] [phone]                Allergies  Allergen Reactions   Cozaar [Losartan] Nausea And Vomiting, Swelling and Rash   Quinolones Itching, Nausea And Vomiting, Nausea Only and Swelling    Swelling of face, jaw, and lips    Cymbalta [Duloxetine Hcl] Itching and Swelling   Sulfa Antibiotics Nausea And Vomiting and Swelling    Headache  Swelling of feet, legs    Synalar [Fluocinolone] Swelling   Cipro [Ciprofloxacin Hcl] Nausea And Vomiting and Swelling    Swelling of face, jaw, lips   Diovan [Valsartan] Nausea And Vomiting and Swelling   Glucophage [Metformin] Other (See Comments)    GI upset   Ketalar [Ketamine] Swelling   Norco [Hydrocodone-Acetaminophen] Itching and Swelling   Norvasc [Amlodipine Besylate] Itching and Swelling    Fluid retention    Nsaids Nausea And Vomiting and Other (See Comments)    Has bleeding ulcers   Tape Itching and Rash    Paper tape can only be used on this patient   Zestril [Lisinopril] Swelling and Rash    Oral swelling Red streaks on arms/legs   Allergies as of 02/11/2023       Reactions   Cozaar [losartan] Nausea And Vomiting, Swelling, Rash   Quinolones Itching, Nausea And Vomiting, Nausea Only, Swelling   Swelling of face, jaw, and lips   Cymbalta [duloxetine Hcl] Itching, Swelling   Sulfa Antibiotics Nausea And Vomiting, Swelling   Headache  Swelling  of feet, legs   Synalar [fluocinolone] Swelling   Cipro [ciprofloxacin Hcl] Nausea And Vomiting, Swelling   Swelling of face, jaw, lips   Diovan [valsartan] Nausea And Vomiting, Swelling   Glucophage [metformin] Other (See Comments)   GI upset   Ketalar [ketamine] Swelling   Norco [hydrocodone-acetaminophen] Itching, Swelling   Norvasc [amlodipine Besylate] Itching, Swelling   Fluid retention    Nsaids Nausea And Vomiting, Other (See Comments)   Has bleeding ulcers   Tape  Itching, Rash   Paper tape can only be used on this patient   Zestril [lisinopril] Swelling, Rash   Oral swelling Red streaks on arms/legs        Medication List     STOP taking these medications    apixaban 2.5 MG Tabs tablet Commonly known as: Eliquis   hydrALAZINE 50 MG tablet Commonly known as: APRESOLINE   methocarbamol 750 MG tablet Commonly known as: ROBAXIN       TAKE these medications    acetaminophen 325 MG tablet Commonly known as: TYLENOL Take 2 tablets (650 mg total) by mouth every 4 (four) hours as needed for mild pain, moderate pain, fever or headache.   allopurinol 100 MG tablet Commonly known as: ZYLOPRIM Take 100 mg by mouth daily.   atorvastatin 80 MG tablet Commonly known as: LIPITOR Take 1 tablet (80 mg total) by mouth daily. Restart on 06/29/22 What changed: when to take this   gabapentin 300 MG capsule Commonly known as: NEURONTIN Take 1 capsule by mouth at bedtime.   HYDROcodone-acetaminophen 5-325 MG tablet Commonly known as: NORCO/VICODIN Take 1 tablet by mouth every 6 (six) hours as needed.   insulin aspart 100 UNIT/ML FlexPen Commonly known as: NOVOLOG Inject 2 Units into the skin 3 (three) times daily with meals. Give only if eats 50% or more of meal. What changed: how much to take   insulin glargine-yfgn 100 UNIT/ML Pen Commonly known as: SEMGLEE Inject 7 Units into the skin daily. What changed: See the new instructions.   levETIRAcetam 500 MG  tablet Commonly known as: KEPPRA Take 500 mg by mouth 2 (two) times daily.   lipase/protease/amylase [address] UNITS Cpep capsule Commonly known as: CREON Take 36,000 Units by mouth 3 (three) times daily with meals.   metoprolol succinate 25 MG 24 hr tablet Commonly known as: TOPROL-XL Take 1.5 tablets (37.5 mg total) by mouth daily.   naloxone 4 MG/0.1ML Liqd nasal spray kit Commonly known as: NARCAN Place 1 spray into the nose once.   NexIUM 40 MG capsule Generic drug: esomeprazole Take 40 mg by mouth 2 (two) times daily before a meal.   ondansetron 4 MG disintegrating tablet Commonly known as: ZOFRAN-ODT Take 4 mg by mouth every 6 (six) hours as needed.   sodium bicarbonate 650 MG tablet Take 1 tablet (650 mg total) by mouth daily.   venlafaxine XR 150 MG 24 hr capsule Commonly known as: EFFEXOR-XR Take 150 mg by mouth daily.        Procedures/Studies: DG Chest Port 1 View  Result Date: 02/05/2023 CLINICAL DATA:  Placement of central venous catheter EXAM: PORTABLE CHEST 1 VIEW COMPARISON:  Previous studies including the examination done on 12/15/2022 FINDINGS: Cardiac size is within normal limits. Apparent shift of mediastinum to the right may be due to rotation. There is interval placement of central venous catheter through the right internal jugular with its tip in right atrium. There is no pleural effusion or pneumothorax. Small linear densities are seen in right lower lung field. Surgical fusion is seen in cervical spine, upper thoracic spine and lumbar spine. Surgical clips are seen in gallbladder fossa. There is deformity in right clavicle consistent with old healed fracture. IMPRESSION: Tip of right IJ central venous catheter is seen in right atrium. There is no pneumothorax. Linear densities in left lower lung field may suggest subsegmental atelectasis or scarring. Electronically Signed   By: Ernie Avena M.D.   On: 02/05/2023 12:55   [place] EKG SITE RITE  Result  Date: 02/05/2023 If Site Rite image not attached, placement could not be confirmed due to current cardiac rhythm.  CT Renal Stone Study  Result Date: 02/04/2023 CLINICAL DATA:  Abdominal and flank pain with emesis. EXAM: CT ABDOMEN AND PELVIS WITHOUT CONTRAST TECHNIQUE: Multidetector CT imaging of the abdomen and pelvis was performed following the standard protocol without IV contrast. RADIATION DOSE REDUCTION: This exam was performed according to the departmental dose-optimization program which includes automated exposure control, adjustment of the mA and/or kV according to patient size and/or use of iterative reconstruction technique. COMPARISON:  CT abdomen and pelvis 08/03/2022 FINDINGS: Lower chest: No acute abnormality. Hepatobiliary: No focal liver abnormality is seen. Status post cholecystectomy. No biliary dilatation. Pancreas: Rounded low-density area in the tail the pancreas measures 18 mm in diameter and was not seen on prior. There is no acute pancreatic inflammation or ductal dilatation. Spleen: Normal in size without focal abnormality. Adrenals/Urinary Tract: Subcentimeter cortical cyst in the left kidney is unchanged. Otherwise, the adrenal glands, kidneys and bladder are within normal limits. Stomach/Bowel: There is moderate fluid distention of the stomach. There some fluid-filled jejunal loops without definite dilatation or transition point. The colon is nondilated. There is a large amount of stool throughout the colon. The appendix is not seen. No free air or pneumatosis. Vascular/Lymphatic: There are severe atherosclerotic disease throughout the aorta and peripheral vessels. No enlarged lymph nodes. No aortic aneurysm. Reproductive: Status post hysterectomy. No adnexal masses. Other: No abdominal wall hernia or abnormality. No abdominopelvic ascites. Musculoskeletal: L1-L4 posterior fusion hardware is present. There is stable compression fracture of T12. Right hip screw is present. Healed left  inferior pubic ramus fracture. IMPRESSION: 1. Moderate fluid distention of the stomach. There are some fluid-filled jejunal loops without definite dilatation or transition point. Findings may be related to gastroenteritis. 2. Large amount of stool throughout the colon. 3. 18 mm low-density lesion in the tail of the pancreas, not seen on prior. This can be further characterized with MRI. 4. Severe atherosclerotic disease of the aorta and peripheral vessels. 5. Subcentimeter left Bosniak I benign renal cyst. No follow-up imaging is recommended. JACR 2018 Feb; 264-273, Management of the Incidental Renal Mass on CT, [employer] 2021; 814-848, Bosniak Classification of Cystic Renal Masses, Version 2019. Aortic Atherosclerosis (ICD10-I70.0). Electronically Signed   By: Darliss Cheney M.D.   On: 02/04/2023 20:42     Subjective: Pt reports she is feeling good today, she has declined the SNF offers and only wants home now if can't go to [facility] (they declined to take her this time).  Discharge Exam: Vitals:   02/10/23 1441 02/10/23 1944  BP: (!) 147/57 (!) 119/55  Pulse: 80 83  Resp: 18 18  Temp:  98.1 F (36.7 C)  SpO2: 99% 100%   Vitals:   02/09/23 2044 02/10/23 0413 02/10/23 1441 02/10/23 1944  BP: (!) 152/52 (!) 122/44 (!) 147/57 (!) 119/55  Pulse: 89 86 80 83  Resp: 20 18 18 18   Temp: 98.7 F (37.1 C) 98.8 F (37.1 C)  98.1 F (36.7 C)  TempSrc: Oral Oral  Oral  SpO2: 100% 99% 99% 100%  Weight:      Height:       General exam: chronically ill appearing, pale, Appears calm and comfortable  Respiratory system: shallow breathing bilateral. Respiratory effort normal. Cardiovascular system: normal S1 & S2 heard. No JVD, murmurs, rubs, gallops or clicks. No pedal edema. Gastrointestinal system: Abdomen is nondistended, soft and nontender. No organomegaly  or masses felt. Normal bowel sounds heard. Central nervous system: Alert and oriented. No focal neurological  deficits. Extremities: Symmetric 5 x 5 power. Skin: No rashes, lesions or ulcers. Psychiatry: Judgement and insight appear poor. Mood & affect anxious and depressed.      The results of significant diagnostics from this hospitalization (including imaging, microbiology, ancillary and laboratory) are listed below for reference.     Microbiology: Recent Results (from the past 240 hour(s))  Urine Culture     Status: Abnormal   Collection Time: 02/04/23 11:40 PM   Specimen: Urine, Clean Catch  Result Value Ref Range Status   Specimen Description   Final    URINE, CLEAN CATCH Performed at [facility], [address] [place], [place] [address]    Special Requests   Final    NONE Performed at [facility], [address] [place], [place] [address]    Culture 50,000 COLONIES/mL YEAST (A)  Final   Report Status 02/06/2023 FINAL  Final     Labs: BNP (last 3 results) No results for input(s): "BNP" in the last 8760 hours. Basic Metabolic Panel: Recent Labs  Lab 02/04/23 1929 02/05/23 0906 02/06/23 0440 02/06/23 0823 02/07/23 0835 02/07/23 1508 02/08/23 0444 02/09/23 0541 02/10/23 0413  NA  --    < > 127*   < > 129* 129* 130* 129* 133*  K  --    < > 3.3*   < > 3.0* 3.1* 3.5 3.7 3.6  CL  --    < > 103   < > 108 108 105 102 100  CO2  --    < > 16*   < > 14* 14* 17* 21* 23  GLUCOSE  --    < > 198*   < > 159* 124* 153* 98 121*  BUN  --    < > 73*   < > 78* 76* 75* 75* 74*  CREATININE  --    < > 2.74*   < > 2.23* 2.15* 2.20* 1.98* 1.87*  CALCIUM  --    < > 8.0*   < > 7.5* 7.6* 7.7* 7.4* 7.8*  MG 1.7  --  1.3*  --  1.6*  --  2.0  --   --   PHOS  --   --  1.8*  --  2.1*  --  1.5* 3.8  --    < > = values in this interval not displayed.   Liver Function Tests: Recent Labs  Lab 02/04/23 1711 02/06/23 0440 02/07/23 0835 02/08/23 0444 02/09/23 0541  AST 17 19 25  39  --   ALT 11 12 17 30   --   ALKPHOS 144* 97 87 97  --   BILITOT 1.3* 0.7 0.6 0.5  --   PROT 6.7 4.9* 4.2* 4.1*   --   ALBUMIN 3.1* 2.1* 1.7* 1.6* 1.5*   Recent Labs  Lab 02/04/23 1711  LIPASE 24   No results for input(s): "AMMONIA" in the last 168 hours. CBC: Recent Labs  Lab 02/07/23 0835 02/07/23 1508 02/08/23 0444 02/09/23 0541 02/10/23 0413  WBC 8.7 9.5 9.6 8.7 8.7  NEUTROABS 7.2  --  7.8*  --   --   HGB 7.4* 7.5* 6.7* 7.8* 8.2*  HCT 22.5* 22.4* 20.2* 23.6* 26.0*  MCV 93.4 92.9 91.8 91.5 95.6  PLT 125* 116* 114* 101* 99*   Cardiac Enzymes: No results for input(s): "CKTOTAL", "CKMB", "CKMBINDEX", "TROPONINI" in the last 168 hours. BNP: Invalid input(s): "POCBNP" CBG:  Recent Labs  Lab 02/10/23 1116 02/10/23 1624 02/10/23 2020 02/11/23 0733 02/11/23 1124  GLUCAP 155* 91 72 93 107*   D-Dimer No results for input(s): "DDIMER" in the last 72 hours. Hgb A1c No results for input(s): "HGBA1C" in the last 72 hours. Lipid Profile No results for input(s): "CHOL", "HDL", "LDLCALC", "TRIG", "CHOLHDL", "LDLDIRECT" in the last 72 hours. Thyroid function studies No results for input(s): "TSH", "T4TOTAL", "T3FREE", "THYROIDAB" in the last 72 hours.  Invalid input(s): "FREET3" Anemia work up No results for input(s): "VITAMINB12", "FOLATE", "FERRITIN", "TIBC", "IRON", "RETICCTPCT" in the last 72 hours. Urinalysis    Component Value Date/Time   COLORURINE RED (A) 02/04/2023 2340   APPEARANCEUR TURBID (A) 02/04/2023 2340   LABSPEC 1.014 02/04/2023 2340   PHURINE 5.0 02/04/2023 2340   GLUCOSEU >=500 (A) 02/04/2023 2340   GLUCOSEU NEGATIVE 03/28/2018 1224   HGBUR LARGE (A) 02/04/2023 2340   BILIRUBINUR NEGATIVE 02/04/2023 2340   KETONESUR NEGATIVE 02/04/2023 2340   PROTEINUR 100 (A) 02/04/2023 2340   UROBILINOGEN 1.0 03/28/2018 1224   NITRITE NEGATIVE 02/04/2023 2340   LEUKOCYTESUR SMALL (A) 02/04/2023 2340   Sepsis Labs Recent Labs  Lab 02/07/23 1508 02/08/23 0444 02/09/23 0541 02/10/23 0413  WBC 9.5 9.6 8.7 8.7   Microbiology Recent Results (from the past 240 hour(s))   Urine Culture     Status: Abnormal   Collection Time: 02/04/23 11:40 PM   Specimen: Urine, Clean Catch  Result Value Ref Range Status   Specimen Description   Final    URINE, CLEAN CATCH Performed at [facility], [address] [place], [place] [address]    Special Requests   Final    NONE Performed at [facility], [address] [place], [place] [address]    Culture 50,000 COLONIES/mL YEAST (A)  Final   Report Status 02/06/2023 FINAL  Final   Time coordinating discharge: 48 mins   SIGNED:  Standley Dakins, MD  [facility] 02/11/2023, 12:58 PM How to contact the [facility] Attending or Consulting provider 7A - 7P or covering provider during after hours 7P -7A, for this patient?  Check the care team in [facility] and look for a) attending/consulting [facility] provider listed and b) the [facility] team listed Log into [email or web address] and use [facility]'s universal password to access. If you do not have the password, please contact the hospital operator. Locate the [facility] provider you are looking for under [facility] and page to a number that you can be directly reached. If you still have difficulty reaching the provider, please page the [facility] (Director on Call) for the Hospitalists listed on amion for assistance.

## 2023-02-11 NOTE — Discharge Instructions (Signed)
IMPORTANT INFORMATION: PAY CLOSE ATTENTION   PHYSICIAN DISCHARGE INSTRUCTIONS  Follow with Primary care provider  Shah, Ashish, MD  and other consultants as instructed by your Hospitalist Physician  SEEK MEDICAL CARE OR RETURN TO EMERGENCY ROOM IF SYMPTOMS COME BACK, WORSEN OR NEW PROBLEM DEVELOPS   Please note: You were cared for by a hospitalist during your hospital stay. Every effort will be made to forward records to your primary care provider.  You can request that your primary care provider send for your hospital records if they have not received them.  Once you are discharged, your primary care physician will handle any further medical issues. Please note that NO REFILLS for any discharge medications will be authorized once you are discharged, as it is imperative that you return to your primary care physician (or establish a relationship with a primary care physician if you do not have one) for your post hospital discharge needs so that they can reassess your need for medications and monitor your lab values.  Please get a complete blood count and chemistry panel checked by your Primary MD at your next visit, and again as instructed by your Primary MD.  Get Medicines reviewed and adjusted: Please take all your medications with you for your next visit with your Primary MD  Laboratory/radiological data: Please request your Primary MD to go over all hospital tests and procedure/radiological results at the follow up, please ask your primary care provider to get all Hospital records sent to his/her office.  In some cases, they will be blood work, cultures and biopsy results pending at the time of your discharge. Please request that your primary care provider follow up on these results.  If you are diabetic, please bring your blood sugar readings with you to your follow up appointment with primary care.    Please call and make your follow up appointments as soon as possible.    Also Note the  following: If you experience worsening of your admission symptoms, develop shortness of breath, life threatening emergency, suicidal or homicidal thoughts you must seek medical attention immediately by calling 911 or calling your MD immediately  if symptoms less severe.  You must read complete instructions/literature along with all the possible adverse reactions/side effects for all the Medicines you take and that have been prescribed to you. Take any new Medicines after you have completely understood and accpet all the possible adverse reactions/side effects.   Do not drive when taking Pain medications or sleeping medications (Benzodiazepines)  Do not take more than prescribed Pain, Sleep and Anxiety Medications. It is not advisable to combine anxiety,sleep and pain medications without talking with your primary care practitioner  Special Instructions: If you have smoked or chewed Tobacco  in the last 2 yrs please stop smoking, stop any regular Alcohol  and or any Recreational drug use.  Wear Seat belts while driving.  Do not drive if taking any narcotic, mind altering or controlled substances or recreational drugs or alcohol.       

## 2023-02-11 NOTE — Consult Note (Signed)
   [facility] CM Inpatient Consult   02/11/2023  TRACI PEERS [birth year] [id number]     Location: [facility] RN Hospital Liaison screened remotely([place]).   [facility] Customer service manager [facility]) Accountable Care Organization [ACO] Patient: Insurance [facility])    Primary Care Provider:  [name], MD [facility] Internal Medicine   Patient screened days readmission hospitalization with noted extreme high risk score for unplanned readmission risk with 4 IP/2 ED in 6 months. [facility]/Population Health RN liaison will assess for potential [facility]) Care Management service needs for post hospital transition for care coordination.  [facility] Liaison spoke with the spouse [name]) concerning [facility] services and offered post hospital follow up for this pt for a [facility] RN care coordinator for assistance with managing her ongoing medical issues. Spouse was very receptive for the follow up call.  Plan. [facility]/Population Health RN Liaison will continue to follow ongoing disposition in assessing for post hospital community care coordination/management needs.  Referral request for community care coordination: pending disposition.   [facility] Care Management/Population Health does not replace or interfere with any arrangements made by the Inpatient Transition of Care team.   For questions contact:    Elliot Cousin, RN, BSN [facility] Liaison [place]   [facility]  Population Health Office Hours MTWF 8:00 am to 6 pm off on Thursday [phone] mobile [phone] [Office toll free line][facility] Office Hours are M-F 8:30 - 5 pm 24 hour nurse advise line [phone] Conceirge  [email or web address]

## 2023-02-11 NOTE — Care Management Important Message (Signed)
Important Message  Patient Details  Name: Heather [name] MRN: [id number] Date of Birth: 1951/05/20   Medicare Important Message Given:  Yes (spoke with [name] at [phone], reveiwed letter)     Corey Harold 02/11/2023, 10:13 AM

## 2023-02-11 NOTE — TOC Transition Note (Signed)
Transition of Care [facility]) - CM/SW Discharge Note   Patient Details  Name: Heather [name] MRN: [id number] Date of Birth: 1951/05/18  Transition of Care [facility]) CM/SW Contact:  Annice Needy, LCSW Phone Number: 02/11/2023, 12:55 PM   Clinical Narrative:    Patient declines CV and JC bed offers and opts to go home with existing [facility] services. [name] is agreeable for patient to come home with [facility] services. HH orders RN/PT/OT requested from attending.    Final next level of care: Home w Home Health Services Barriers to Discharge: No Barriers Identified   Patient Goals and CMS Choice   Choice offered to / list presented to : Spouse  Discharge Placement                         Discharge Plan and Services Additional resources added to the After Visit Summary for   In-house Referral: Clinical Social Work   Post Acute Care Choice: Resumption of Svcs/PTA Provider                    HH Arranged: RN, PT, OT [facility] Agency: [facility] Date [facility] Agency Contacted: 02/08/23 Time HH Agency Contacted: [phone] Representative spoke with at [facility] Agency: Kandee Keen  Social Determinants of Health (SDOH) Interventions SDOH Screenings   Food Insecurity: No Food Insecurity (12/12/2022)  Housing: Low Risk  (12/12/2022)  Transportation Needs: No Transportation Needs (12/12/2022)  Utilities: Not At Risk (12/12/2022)  Tobacco Use: Low Risk  (02/04/2023)     Readmission Risk Interventions    02/06/2023    8:28 AM 11/14/2022   11:31 AM 11/12/2022    4:06 PM  Readmission Risk Prevention Plan  Transportation Screening Complete  Complete  Medication Review [employer]) Complete  Complete  PCP or Specialist appointment within 3-5 days of discharge  Complete Not Complete  HRI or Home Care Consult Complete  Complete  SW Recovery Care/Counseling Consult Complete  Complete  Palliative Care Screening Not Applicable  Not Applicable  Skilled Nursing Facility Not Applicable  Complete

## 2023-02-12 ENCOUNTER — Telehealth: Payer: Self-pay | Admitting: *Deleted

## 2023-02-12 LAB — GLUCOSE, CAPILLARY: Glucose-Capillary: 142 mg/dL — ABNORMAL HIGH (ref 70–99)

## 2023-02-12 NOTE — Progress Notes (Unsigned)
  Care Coordination  Outreach Note  02/12/2023 Name: Heather [name] MRN: [id number] DOB: 1951-04-08   Care Coordination Outreach Attempts: An unsuccessful telephone outreach was attempted today to offer the patient information about available care coordination services as a benefit of their health plan.   Follow Up Plan:  Additional outreach attempts will be made to offer the patient care coordination information and services.   Encounter Outcome:  No Answer  Christie Nottingham  Care Coordination Care Guide  Direct Dial: [phone]

## 2023-02-13 NOTE — Progress Notes (Signed)
Patient on upstream list Routed referral to upstream by email "[email or web address]"   [facility] Coordination Care Guide  Direct Dial: [phone]

## 2023-02-13 NOTE — Progress Notes (Signed)
  Care Coordination  Outreach Note  02/13/2023 Name: SHAWNDA DOVEL MRN: [id number] DOB: 1951-05-24   Care Coordination Outreach Attempts: A second unsuccessful outreach was attempted today to offer the patient with information about available care coordination services as a benefit of their health plan.     Follow Up Plan:  Additional outreach attempts will be made to offer the patient care coordination information and services.   Encounter Outcome:  No Answer  Christie Nottingham  Care Coordination Care Guide  Direct Dial: [phone]

## 2023-02-14 DIAGNOSIS — I5032 Chronic diastolic (congestive) heart failure: Secondary | ICD-10-CM | POA: Diagnosis not present

## 2023-02-14 DIAGNOSIS — B9689 Other specified bacterial agents as the cause of diseases classified elsewhere: Secondary | ICD-10-CM | POA: Diagnosis not present

## 2023-02-14 DIAGNOSIS — E1122 Type 2 diabetes mellitus with diabetic chronic kidney disease: Secondary | ICD-10-CM | POA: Diagnosis not present

## 2023-02-14 DIAGNOSIS — S72451D Displaced supracondylar fracture without intracondylar extension of lower end of right femur, subsequent encounter for closed fracture with routine healing: Secondary | ICD-10-CM | POA: Diagnosis not present

## 2023-02-14 DIAGNOSIS — D62 Acute posthemorrhagic anemia: Secondary | ICD-10-CM | POA: Diagnosis not present

## 2023-02-14 DIAGNOSIS — N390 Urinary tract infection, site not specified: Secondary | ICD-10-CM | POA: Diagnosis not present

## 2023-02-14 DIAGNOSIS — E1165 Type 2 diabetes mellitus with hyperglycemia: Secondary | ICD-10-CM | POA: Diagnosis not present

## 2023-02-14 DIAGNOSIS — I130 Hypertensive heart and chronic kidney disease with heart failure and stage 1 through stage 4 chronic kidney disease, or unspecified chronic kidney disease: Secondary | ICD-10-CM | POA: Diagnosis not present

## 2023-02-14 DIAGNOSIS — N184 Chronic kidney disease, stage 4 (severe): Secondary | ICD-10-CM | POA: Diagnosis not present

## 2023-02-17 ENCOUNTER — Encounter (HOSPITAL_COMMUNITY): Payer: Self-pay

## 2023-02-17 ENCOUNTER — Emergency Department (HOSPITAL_COMMUNITY): Payer: Medicare PPO

## 2023-02-17 ENCOUNTER — Inpatient Hospital Stay (HOSPITAL_COMMUNITY)
Admission: EM | Admit: 2023-02-17 | Discharge: 2023-02-25 | DRG: 377 | Disposition: A | Discharge status: Home Health | Payer: Medicare PPO | Attending: Family Medicine | Admitting: Family Medicine

## 2023-02-17 ENCOUNTER — Other Ambulatory Visit: Payer: Self-pay

## 2023-02-17 DIAGNOSIS — K5229 Other allergic and dietetic gastroenteritis and colitis: Secondary | ICD-10-CM | POA: Diagnosis not present

## 2023-02-17 DIAGNOSIS — L89322 Pressure ulcer of left buttock, stage 2: Secondary | ICD-10-CM | POA: Diagnosis not present

## 2023-02-17 DIAGNOSIS — K5000 Crohn's disease of small intestine without complications: Secondary | ICD-10-CM | POA: Diagnosis not present

## 2023-02-17 DIAGNOSIS — K264 Chronic or unspecified duodenal ulcer with hemorrhage: Principal | ICD-10-CM | POA: Diagnosis present

## 2023-02-17 DIAGNOSIS — K2210 Ulcer of esophagus without bleeding: Secondary | ICD-10-CM | POA: Diagnosis present

## 2023-02-17 DIAGNOSIS — I6340 Cerebral infarction due to embolism of unspecified cerebral artery: Secondary | ICD-10-CM | POA: Diagnosis present

## 2023-02-17 DIAGNOSIS — K269 Duodenal ulcer, unspecified as acute or chronic, without hemorrhage or perforation: Secondary | ICD-10-CM

## 2023-02-17 DIAGNOSIS — I509 Heart failure, unspecified: Secondary | ICD-10-CM | POA: Diagnosis not present

## 2023-02-17 DIAGNOSIS — D649 Anemia, unspecified: Secondary | ICD-10-CM | POA: Diagnosis present

## 2023-02-17 DIAGNOSIS — Z881 Allergy status to other antibiotic agents status: Secondary | ICD-10-CM

## 2023-02-17 DIAGNOSIS — E1140 Type 2 diabetes mellitus with diabetic neuropathy, unspecified: Secondary | ICD-10-CM | POA: Diagnosis present

## 2023-02-17 DIAGNOSIS — D62 Acute posthemorrhagic anemia: Secondary | ICD-10-CM | POA: Diagnosis not present

## 2023-02-17 DIAGNOSIS — G9341 Metabolic encephalopathy: Secondary | ICD-10-CM | POA: Diagnosis present

## 2023-02-17 DIAGNOSIS — N390 Urinary tract infection, site not specified: Secondary | ICD-10-CM | POA: Diagnosis not present

## 2023-02-17 DIAGNOSIS — K921 Melena: Secondary | ICD-10-CM | POA: Diagnosis not present

## 2023-02-17 DIAGNOSIS — Z794 Long term (current) use of insulin: Secondary | ICD-10-CM | POA: Diagnosis not present

## 2023-02-17 DIAGNOSIS — E1129 Type 2 diabetes mellitus with other diabetic kidney complication: Secondary | ICD-10-CM | POA: Diagnosis not present

## 2023-02-17 DIAGNOSIS — M50321 Other cervical disc degeneration at C4-C5 level: Secondary | ICD-10-CM | POA: Diagnosis present

## 2023-02-17 DIAGNOSIS — Z8673 Personal history of transient ischemic attack (TIA), and cerebral infarction without residual deficits: Secondary | ICD-10-CM | POA: Diagnosis not present

## 2023-02-17 DIAGNOSIS — N189 Chronic kidney disease, unspecified: Secondary | ICD-10-CM | POA: Diagnosis not present

## 2023-02-17 DIAGNOSIS — Z833 Family history of diabetes mellitus: Secondary | ICD-10-CM | POA: Diagnosis not present

## 2023-02-17 DIAGNOSIS — N186 End stage renal disease: Secondary | ICD-10-CM | POA: Diagnosis not present

## 2023-02-17 DIAGNOSIS — I130 Hypertensive heart and chronic kidney disease with heart failure and stage 1 through stage 4 chronic kidney disease, or unspecified chronic kidney disease: Secondary | ICD-10-CM | POA: Diagnosis not present

## 2023-02-17 DIAGNOSIS — K8689 Other specified diseases of pancreas: Secondary | ICD-10-CM | POA: Diagnosis not present

## 2023-02-17 DIAGNOSIS — I5032 Chronic diastolic (congestive) heart failure: Secondary | ICD-10-CM | POA: Diagnosis present

## 2023-02-17 DIAGNOSIS — H539 Unspecified visual disturbance: Secondary | ICD-10-CM

## 2023-02-17 DIAGNOSIS — Z882 Allergy status to sulfonamides status: Secondary | ICD-10-CM

## 2023-02-17 DIAGNOSIS — K869 Disease of pancreas, unspecified: Secondary | ICD-10-CM | POA: Diagnosis not present

## 2023-02-17 DIAGNOSIS — N184 Chronic kidney disease, stage 4 (severe): Secondary | ICD-10-CM | POA: Diagnosis not present

## 2023-02-17 DIAGNOSIS — Z800 Family history of malignant neoplasm of digestive organs: Secondary | ICD-10-CM

## 2023-02-17 DIAGNOSIS — R54 Age-related physical debility: Secondary | ICD-10-CM | POA: Diagnosis present

## 2023-02-17 DIAGNOSIS — M109 Gout, unspecified: Secondary | ICD-10-CM | POA: Diagnosis present

## 2023-02-17 DIAGNOSIS — K625 Hemorrhage of anus and rectum: Secondary | ICD-10-CM | POA: Diagnosis not present

## 2023-02-17 DIAGNOSIS — K922 Gastrointestinal hemorrhage, unspecified: Principal | ICD-10-CM | POA: Diagnosis present

## 2023-02-17 DIAGNOSIS — I10 Essential (primary) hypertension: Secondary | ICD-10-CM | POA: Diagnosis present

## 2023-02-17 DIAGNOSIS — E1149 Type 2 diabetes mellitus with other diabetic neurological complication: Secondary | ICD-10-CM | POA: Diagnosis not present

## 2023-02-17 DIAGNOSIS — Z888 Allergy status to other drugs, medicaments and biological substances status: Secondary | ICD-10-CM

## 2023-02-17 DIAGNOSIS — E1322 Other specified diabetes mellitus with diabetic chronic kidney disease: Secondary | ICD-10-CM | POA: Diagnosis not present

## 2023-02-17 DIAGNOSIS — K279 Peptic ulcer, site unspecified, unspecified as acute or chronic, without hemorrhage or perforation: Secondary | ICD-10-CM

## 2023-02-17 DIAGNOSIS — I2510 Atherosclerotic heart disease of native coronary artery without angina pectoris: Secondary | ICD-10-CM | POA: Diagnosis present

## 2023-02-17 DIAGNOSIS — E782 Mixed hyperlipidemia: Secondary | ICD-10-CM | POA: Diagnosis present

## 2023-02-17 DIAGNOSIS — K529 Noninfective gastroenteritis and colitis, unspecified: Secondary | ICD-10-CM | POA: Diagnosis present

## 2023-02-17 DIAGNOSIS — N179 Acute kidney failure, unspecified: Secondary | ICD-10-CM | POA: Diagnosis not present

## 2023-02-17 DIAGNOSIS — N200 Calculus of kidney: Secondary | ICD-10-CM | POA: Diagnosis not present

## 2023-02-17 DIAGNOSIS — Z79899 Other long term (current) drug therapy: Secondary | ICD-10-CM | POA: Diagnosis not present

## 2023-02-17 DIAGNOSIS — Z993 Dependence on wheelchair: Secondary | ICD-10-CM

## 2023-02-17 DIAGNOSIS — E1122 Type 2 diabetes mellitus with diabetic chronic kidney disease: Secondary | ICD-10-CM | POA: Diagnosis not present

## 2023-02-17 DIAGNOSIS — J9811 Atelectasis: Secondary | ICD-10-CM | POA: Diagnosis present

## 2023-02-17 DIAGNOSIS — L89312 Pressure ulcer of right buttock, stage 2: Secondary | ICD-10-CM | POA: Diagnosis not present

## 2023-02-17 DIAGNOSIS — M1A9XX0 Chronic gout, unspecified, without tophus (tophi): Secondary | ICD-10-CM | POA: Diagnosis not present

## 2023-02-17 DIAGNOSIS — E119 Type 2 diabetes mellitus without complications: Secondary | ICD-10-CM

## 2023-02-17 DIAGNOSIS — I110 Hypertensive heart disease with heart failure: Secondary | ICD-10-CM | POA: Diagnosis not present

## 2023-02-17 DIAGNOSIS — Z886 Allergy status to analgesic agent status: Secondary | ICD-10-CM

## 2023-02-17 DIAGNOSIS — K50918 Crohn's disease, unspecified, with other complication: Secondary | ICD-10-CM | POA: Diagnosis not present

## 2023-02-17 DIAGNOSIS — K2211 Ulcer of esophagus with bleeding: Secondary | ICD-10-CM | POA: Diagnosis not present

## 2023-02-17 DIAGNOSIS — K2950 Unspecified chronic gastritis without bleeding: Secondary | ICD-10-CM | POA: Diagnosis not present

## 2023-02-17 DIAGNOSIS — K274 Chronic or unspecified peptic ulcer, site unspecified, with hemorrhage: Secondary | ICD-10-CM | POA: Diagnosis not present

## 2023-02-17 DIAGNOSIS — E46 Unspecified protein-calorie malnutrition: Secondary | ICD-10-CM | POA: Diagnosis present

## 2023-02-17 DIAGNOSIS — K6389 Other specified diseases of intestine: Secondary | ICD-10-CM | POA: Diagnosis not present

## 2023-02-17 DIAGNOSIS — E8809 Other disorders of plasma-protein metabolism, not elsewhere classified: Secondary | ICD-10-CM | POA: Diagnosis not present

## 2023-02-17 DIAGNOSIS — K2980 Duodenitis without bleeding: Secondary | ICD-10-CM | POA: Diagnosis not present

## 2023-02-17 DIAGNOSIS — K769 Liver disease, unspecified: Secondary | ICD-10-CM | POA: Diagnosis not present

## 2023-02-17 LAB — CBC
HCT: 10.3 % — ABNORMAL LOW (ref 36.0–46.0)
Hemoglobin: 3.2 g/dL — CL (ref 12.0–15.0)
MCH: 31.4 pg (ref 26.0–34.0)
MCHC: 31.1 g/dL (ref 30.0–36.0)
MCV: 101 fL — ABNORMAL HIGH (ref 80.0–100.0)
Platelets: 348 10*3/uL (ref 150–400)
RBC: 1.02 MIL/uL — ABNORMAL LOW (ref 3.87–5.11)
RDW: 16.1 % — ABNORMAL HIGH (ref 11.5–15.5)
WBC: 17.2 10*3/uL — ABNORMAL HIGH (ref 4.0–10.5)
nRBC: 0 % (ref 0.0–0.2)

## 2023-02-17 LAB — CBC WITH DIFFERENTIAL/PLATELET
Abs Immature Granulocytes: 0.48 10*3/uL — ABNORMAL HIGH (ref 0.00–0.07)
Basophils Absolute: 0.1 10*3/uL (ref 0.0–0.1)
Basophils Relative: 1 %
Eosinophils Absolute: 0.1 10*3/uL (ref 0.0–0.5)
Eosinophils Relative: 1 %
HCT: 12.6 % — ABNORMAL LOW (ref 36.0–46.0)
Hemoglobin: 3.8 g/dL — CL (ref 12.0–15.0)
Immature Granulocytes: 3 %
Lymphocytes Relative: 7 %
Lymphs Abs: 1.2 10*3/uL (ref 0.7–4.0)
MCH: 30.9 pg (ref 26.0–34.0)
MCHC: 30.2 g/dL (ref 30.0–36.0)
MCV: 102.4 fL — ABNORMAL HIGH (ref 80.0–100.0)
Monocytes Absolute: 0.4 10*3/uL (ref 0.1–1.0)
Monocytes Relative: 2 %
Neutro Abs: 14.4 10*3/uL — ABNORMAL HIGH (ref 1.7–7.7)
Neutrophils Relative %: 86 %
Platelets: 367 10*3/uL (ref 150–400)
RBC: 1.23 MIL/uL — ABNORMAL LOW (ref 3.87–5.11)
RDW: 16.4 % — ABNORMAL HIGH (ref 11.5–15.5)
WBC: 16.6 10*3/uL — ABNORMAL HIGH (ref 4.0–10.5)
nRBC: 0.2 % (ref 0.0–0.2)

## 2023-02-17 LAB — TYPE AND SCREEN

## 2023-02-17 LAB — GLUCOSE, CAPILLARY
Glucose-Capillary: 157 mg/dL — ABNORMAL HIGH (ref 70–99)
Glucose-Capillary: 183 mg/dL — ABNORMAL HIGH (ref 70–99)

## 2023-02-17 LAB — COMPREHENSIVE METABOLIC PANEL
ALT: 11 U/L (ref 0–44)
AST: 25 U/L (ref 15–41)
Albumin: 1.6 g/dL — ABNORMAL LOW (ref 3.5–5.0)
Alkaline Phosphatase: 94 U/L (ref 38–126)
Anion gap: 14 (ref 5–15)
BUN: 115 mg/dL — ABNORMAL HIGH (ref 8–23)
CO2: 21 mmol/L — ABNORMAL LOW (ref 22–32)
Calcium: 7.5 mg/dL — ABNORMAL LOW (ref 8.9–10.3)
Chloride: 100 mmol/L (ref 98–111)
Creatinine, Ser: 3.26 mg/dL — ABNORMAL HIGH (ref 0.44–1.00)
GFR, Estimated: 15 mL/min — ABNORMAL LOW (ref >60)
Glucose, Bld: 146 mg/dL — ABNORMAL HIGH (ref 70–99)
Potassium: 4.6 mmol/L (ref 3.5–5.1)
Sodium: 135 mmol/L (ref 135–145)
Total Bilirubin: 0.6 mg/dL (ref 0.3–1.2)
Total Protein: 4.3 g/dL — ABNORMAL LOW (ref 6.5–8.1)

## 2023-02-17 LAB — FERRITIN: Ferritin: 1462 ng/mL — ABNORMAL HIGH (ref 11–307)

## 2023-02-17 LAB — LIPASE, BLOOD: Lipase: 21 U/L (ref 11–51)

## 2023-02-17 LAB — IRON AND TIBC
Iron: 51 ug/dL (ref 28–170)
Saturation Ratios: 38 % — ABNORMAL HIGH (ref 10.4–31.8)
TIBC: 135 ug/dL — ABNORMAL LOW (ref 250–450)
UIBC: 84 ug/dL

## 2023-02-17 LAB — SEDIMENTATION RATE: Sed Rate: >130 mm/hr — ABNORMAL HIGH (ref 0–22)

## 2023-02-17 LAB — PREPARE RBC (CROSSMATCH)

## 2023-02-17 MED ORDER — PANTOPRAZOLE SODIUM 40 MG IV SOLR
40.0000 mg | Freq: Two times a day (BID) | INTRAVENOUS | Status: DC
  Administered 2023-02-17 – 2023-02-19 (×4): 40 mg via INTRAVENOUS
  Filled 2023-02-17 (×4): qty 10

## 2023-02-17 MED ORDER — SODIUM CHLORIDE 0.9 % IV SOLN
2.0000 g | INTRAVENOUS | Status: DC
  Administered 2023-02-18 – 2023-02-24 (×8): 2 g via INTRAVENOUS
  Filled 2023-02-17 (×8): qty 20

## 2023-02-17 MED ORDER — ACETAMINOPHEN 650 MG RE SUPP: 650.0000 mg | Freq: Four times a day (QID) | RECTAL | Status: DC | PRN

## 2023-02-17 MED ORDER — ALBUMIN HUMAN 25 % IV SOLN: 25.0000 g | Freq: Once | INTRAVENOUS | Status: DC

## 2023-02-17 MED ORDER — ACETAMINOPHEN 325 MG PO TABS
650.0000 mg | ORAL_TABLET | Freq: Four times a day (QID) | ORAL | Status: DC | PRN
  Administered 2023-02-17 – 2023-02-23 (×3): 650 mg via ORAL
  Filled 2023-02-17 (×4): qty 2

## 2023-02-17 MED ORDER — INSULIN ASPART 100 UNIT/ML IJ SOLN
0.0000 [IU] | INTRAMUSCULAR | Status: DC
  Administered 2023-02-17: 2 [IU] via SUBCUTANEOUS
  Administered 2023-02-18: 1 [IU] via SUBCUTANEOUS

## 2023-02-17 MED ORDER — CHLORHEXIDINE GLUCONATE CLOTH 2 % EX PADS
6.0000 | MEDICATED_PAD | Freq: Every day | CUTANEOUS | Status: DC
  Administered 2023-02-18 – 2023-02-25 (×7): 6 via TOPICAL

## 2023-02-17 MED ORDER — PANTOPRAZOLE SODIUM 40 MG IV SOLR: 40.0000 mg | Freq: Once | INTRAVENOUS | Status: DC

## 2023-02-17 MED ORDER — CALCIUM GLUCONATE-NACL 1-0.675 GM/50ML-% IV SOLN: 1.0000 g | Freq: Once | INTRAVENOUS | Status: DC

## 2023-02-17 MED ORDER — POLYETHYLENE GLYCOL 3350 17 G PO PACK: 17.0000 g | PACK | Freq: Every day | ORAL | Status: DC | PRN

## 2023-02-17 MED ORDER — LEVETIRACETAM 500 MG PO TABS
500.0000 mg | ORAL_TABLET | Freq: Two times a day (BID) | ORAL | Status: DC
  Administered 2023-02-17 – 2023-02-25 (×13): 500 mg via ORAL
  Filled 2023-02-17 (×18): qty 1

## 2023-02-17 MED ORDER — LACTATED RINGERS IV BOLUS: 1000.0000 mL | Freq: Once | INTRAVENOUS | Status: DC

## 2023-02-17 MED ORDER — SODIUM CHLORIDE 0.9% IV SOLUTION: Freq: Once | INTRAVENOUS | Status: AC

## 2023-02-17 MED ORDER — SODIUM BICARBONATE 650 MG PO TABS
650.0000 mg | ORAL_TABLET | Freq: Every day | ORAL | Status: DC
  Administered 2023-02-17 – 2023-02-25 (×7): 650 mg via ORAL
  Filled 2023-02-17 (×7): qty 1

## 2023-02-17 MED ORDER — LABETALOL HCL 5 MG/ML IV SOLN: 5.0000 mg | INTRAVENOUS | Status: DC | PRN

## 2023-02-17 MED ORDER — SODIUM CHLORIDE 0.9% IV SOLUTION: Freq: Once | INTRAVENOUS | Status: DC

## 2023-02-17 MED ORDER — PANCRELIPASE (LIP-PROT-AMYL) 12000-38000 UNITS PO CPEP
36000.0000 [IU] | ORAL_CAPSULE | Freq: Three times a day (TID) | ORAL | Status: DC
  Administered 2023-02-18 – 2023-02-25 (×13): 36000 [IU] via ORAL
  Filled 2023-02-17 (×5): qty 3
  Filled 2023-02-17: qty 1
  Filled 2023-02-17 (×3): qty 3
  Filled 2023-02-17: qty 1
  Filled 2023-02-17 (×2): qty 3
  Filled 2023-02-17: qty 1
  Filled 2023-02-17: qty 3
  Filled 2023-02-17: qty 1

## 2023-02-17 MED ORDER — METRONIDAZOLE 500 MG/100ML IV SOLN
500.0000 mg | Freq: Two times a day (BID) | INTRAVENOUS | Status: DC
  Administered 2023-02-18 – 2023-02-21 (×7): 500 mg via INTRAVENOUS
  Filled 2023-02-17 (×7): qty 100

## 2023-02-17 MED ORDER — ALLOPURINOL 100 MG PO TABS
100.0000 mg | ORAL_TABLET | Freq: Every day | ORAL | Status: DC
  Administered 2023-02-18 – 2023-02-25 (×6): 100 mg via ORAL
  Filled 2023-02-17 (×6): qty 1

## 2023-02-17 MED ORDER — ATORVASTATIN CALCIUM 40 MG PO TABS
80.0000 mg | ORAL_TABLET | Freq: Every day | ORAL | Status: DC
  Administered 2023-02-17 – 2023-02-23 (×7): 80 mg via ORAL
  Filled 2023-02-17 (×9): qty 2

## 2023-02-17 MED ORDER — FUROSEMIDE 10 MG/ML IJ SOLN
40.0000 mg | Freq: Once | INTRAMUSCULAR | Status: AC
  Administered 2023-02-17: 40 mg via INTRAVENOUS
  Filled 2023-02-17: qty 4

## 2023-02-17 MED ORDER — VENLAFAXINE HCL ER 75 MG PO CP24
150.0000 mg | ORAL_CAPSULE | Freq: Every day | ORAL | Status: DC
  Administered 2023-02-18 – 2023-02-25 (×6): 150 mg via ORAL
  Filled 2023-02-17 (×6): qty 2

## 2023-02-17 NOTE — Assessment & Plan Note (Addendum)
With acute on chronic anemia.  Hgb down to 3.2, recent hemoglobin/14 on discharge from the hospital 8.2, she received 1 unit PRBC for hemoglobin of 6.7.  Reports bloody stools over the past 3 days, no hematemesis.  Positive NSAID use history. Not on anticoagulation.  10/2020, capsule endoscopy-several small aphthous appearing ulcers scattered in small bowel,-mild IBD/Crohn's. Colonoscopy and EGD 2021 were unremarkable. - EDP talked with Dr. Levon Hedger, will see in consult in a.m., -1 unit transfused, transfused 2 more units  -IV Lasix 40 mg x 1  -Cautious with fluids and blood, CT abdomen and pelvis showing moderate bilateral pleural effusions -IV Protonix 40 twice daily -Trend H&H -Clear liquid diet, n.p.o. midnight -Has 1 peripheral access obtained via ultrasound by ED provider, may need PICC line in a.m.

## 2023-02-17 NOTE — Assessment & Plan Note (Signed)
Incidental finding on CT today 02/17/2023-  Low-density pancreatic lesion. When the patient is clinically stable and able to follow directions and hold their breath (preferably as an outpatient) further evaluation with dedicated abdominal MRI could be considered for further characterization. -Follow-up as outpatient

## 2023-02-17 NOTE — ED Notes (Signed)
Assumed care of patient.

## 2023-02-17 NOTE — Assessment & Plan Note (Addendum)
Initial unreliable blood pressures.  Arterial line placed in ED. -Will use as needed IV labetalol  greater than 180 -Hold home antihypertensives metoprolol 37.5mg 

## 2023-02-17 NOTE — Assessment & Plan Note (Addendum)
Presenting with bloody stools, history of Crohn's.  Leukocytosis of 17.2.  ESR elevated at >130.  Serum ferritin 1462.  Rules out for sepsis.  Denies pain with bowel movements.  CT abdomen and pelvis without contrast-  Mild bowel wall thickening and mucosal enhancement involving portions of the sigmoid colon and rectum may reflect proctocolitis. -IV ceftriaxone and metronidazole for now -Follow-up GI recs in a.m. -Reports dysuria, follow-up UA

## 2023-02-17 NOTE — Assessment & Plan Note (Signed)
-   HgbA1c - SSI- S q4h

## 2023-02-17 NOTE — Assessment & Plan Note (Addendum)
Patient initially confused in triage, mental status is improved after 1 unit PRBC.  Encephalopathy likely due to severe anemia, ??  Proctocolitis.  She has a leukocytosis of 17.2.  Also reports dysuria. - Monitor for now, defer to brain imaging for now -Resume home Keppra, venlafaxine - Follow-up UA.

## 2023-02-17 NOTE — ED Notes (Signed)
Kommor, MD tried to place a second ultrasound guided IV and was unsuccessful, he stated it was okay to wait and give other medication when the blood was done transfusing.

## 2023-02-17 NOTE — H&P (Signed)
History and Physical    [name]:[id number] DOB: 23-Jun-1951 DOA: 02/17/2023  PCP: [name], MD   Patient coming from: Home  I have personally briefly reviewed patient's old medical records in [facility]  Chief Complaint: Blood in stools  HPI: Heather [name] is a 72 y.o. female with medical history significant for diabetes mellitus, Crohn's disease, CKD 4, diastolic CHF, pulmonary hypertension, gout. Patient presented to the ED with reports of dark red stools. Per notes, patient was confused in triage.  After transfusion in ED, mental status improved. At time of my evaluation, patient is A & O x 4, she is able to give a history.  She tells me she has been seeing blood in her stools.  She reports 4 bowel movements today, denies pain with bowel movements.  Denies abdominal pain.  No vomiting.  Reports poor oral intake.  She denies difficulty breathing, no orthopnea, no chest pain.  She takes ibuprofen about 3 tablets twice a day on average 2 days a week.  She has history of Crohn's, at baseline has 0-1 bowel movements daily.   Reports dysuria over the past 1 to 2 days.   Recent hospitalization 4/8 through 4/15 for HHS, blood glucose >800, with AKI on CKD creatinine up to 3.19, metabolic acidosis, gastroenteritis, UTI, anemia.  She was treated with insulin drip, bicarbonate infusion, IV ceftriaxone and Diflucan, IV fluids.  On discharge her hemoglobin was 8.2 and creatinine was down to 1.8.  ED Course: Temperature 97.8.  Heart rate 60s to 80s.  Respiratory 10-20.  Blood pressure systolic 86-116.  O2 sats greater than 98% on room air. Hemoglobin 3.2.  Leukocytosis of 17.2. CT abdomen and pelvis without contrast suggests  Mild bowel wall thickening and mucosal enhancement involving portions of the sigmoid colon and rectum may reflect proctocolitis.  EDP talked to Dr. Levon Hedger, recommended PPI, patient had poor peripheral IV access, ED provider placed a left brachial  line via ultrasound.  ED provider subsequently placed an A-line for accurate blood pressure measurements in left radial.  Review of Systems: As per HPI all other systems reviewed and negative.  Past Medical History:  Diagnosis Date   Acute renal failure superimposed on stage 3 chronic kidney disease 01/21/2016   Anemia    Anxiety    B12 deficiency    Brain tumor (benign) 10/29/2020   Cellulitis and abscess    Abdomen and buttocks   Chronic diastolic CHF (congestive heart failure) 03/16/2022   CKD (chronic kidney disease) stage 3, GFR 30-59 ml/min    Closed fracture of distal end of femur, unspecified fracture morphology, initial encounter    Coronary arteriosclerosis 07/15/2018   Crohn disease    Depression    Diabetic neuropathy    feet   DJD (degenerative joint disease)    Right forminal stenosis C4-5   Essential hypertension    Femur fracture, right 03/30/2017   Folliculitis    GERD (gastroesophageal reflux disease)    Gout    Headache(784.0)    Hiatal hernia    History of blood transfusion    History of bronchitis    History of cardiac catheterization    No significant CAD 2012   History of kidney stones    surgery to remove   Insomnia    Irritable bowel syndrome    Mixed hyperlipidemia    Nausea & vomiting 12/22/2014   Osteoarthritis    Palpitations    Pneumonia    Reflux esophagitis  Salmonella    Stroke    Tinnitus    Right   Type 2 diabetes mellitus     Past Surgical History:  Procedure Laterality Date   BACK SURGERY     c-spine surgery     03/2007   CARDIAC CATHETERIZATION     CHOLECYSTECTOMY     COLONOSCOPY  04/21/2011; 05/29/11   6/12: [facility] - ?AVM at IC valve, inflammatory changes at IC valve [facility]); 7/12 - [facility]; IC valve erosions, look chronic and probably Crohn's per path   colonoscopy  2017   [facility]: random biopsies normal. TI normal   ESOPHAGOGASTRODUODENOSCOPY  05/29/11   normal   ESOPHAGOGASTRODUODENOSCOPY N/A  01/21/2016   Dr. Bosie Clos: normal   EYE SURGERY Bilateral    cataracts removed   FEMUR IM NAIL Right 04/10/2018   Procedure: INTRAMEDULLARY (IM) NAIL FEMORAL;  Surgeon: Durene Romans, MD;  Location: [facility];  Service: Orthopedics;  Laterality: Right;   FRACTURE SURGERY     GIVENS CAPSULE STUDY  11/17/2020   HARDWARE REMOVAL Right 04/10/2018   Procedure: HARDWARE REMOVAL RIGHT DISTAL FEMUR;  Surgeon: Durene Romans, MD;  Location: [facility];  Service: Orthopedics;  Laterality: Right;   HERNIA REPAIR     IR FLUORO GUIDE CV LINE RIGHT  04/01/2017   IR US GUIDE VASC ACCESS RIGHT  04/01/2017   JOINT REPLACEMENT Right    hip   OPEN REDUCTION INTERNAL FIXATION (ORIF) DISTAL RADIAL FRACTURE Right 05/28/2019   Procedure: OPEN REDUCTION INTERNAL FIXATION (ORIF) DISTAL RADIAL FRACTURE;  Surgeon: Ernest Mallick, MD;  Location: [facility] OR;  Service: Orthopedics;  Laterality: Right;  with block 90 minutes   ORIF FEMUR FRACTURE Right 03/31/2017   Procedure: OPEN REDUCTION INTERNAL FIXATION (ORIF) DISTAL FEMUR FRACTURE;  Surgeon: Durene Romans, MD;  Location: [facility] OR;  Service: Orthopedics;  Laterality: Right;   ORIF FEMUR FRACTURE Right 12/14/2022   Procedure: OPEN REDUCTION INTERNAL FIXATION (ORIF) DISTAL FEMUR FRACTURE;  Surgeon: Roby Lofts, MD;  Location: [facility] OR;  Service: Orthopedics;  Laterality: Right;   removal of kidney stone     Right knee arthroscopic surgery     TONSILLECTOMY     TOTAL ABDOMINAL HYSTERECTOMY       reports that she has never smoked. She has never used smokeless tobacco. She reports that she does not drink alcohol and does not use drugs.  Allergies  Allergen Reactions   Cozaar [Losartan] Nausea And Vomiting, Swelling and Rash   Quinolones Itching, Nausea And Vomiting, Nausea Only and Swelling    Swelling of face, jaw, and lips    Cymbalta [Duloxetine Hcl] Itching and Swelling   Sulfa Antibiotics Nausea And Vomiting and Swelling    Headache  Swelling of feet, legs    Synalar  [Fluocinolone] Swelling   Cipro [Ciprofloxacin Hcl] Nausea And Vomiting and Swelling    Swelling of face, jaw, lips   Diovan [Valsartan] Nausea And Vomiting and Swelling   Glucophage [Metformin] Other (See Comments)    GI upset   Ketalar [Ketamine] Swelling   Norco [Hydrocodone-Acetaminophen] Itching and Swelling   Norvasc [Amlodipine Besylate] Itching and Swelling    Fluid retention    Nsaids Nausea And Vomiting and Other (See Comments)    Has bleeding ulcers   Tape Itching and Rash    Paper tape can only be used on this patient   Zestril [Lisinopril] Swelling and Rash    Oral swelling Red streaks on arms/legs    Family History  Problem Relation  Age of Onset   Diabetes Mother    Colon cancer Brother        POSSIBLY. Patient states diagnosed at age 77, then later states age 17. unclear and limited historian   Esophageal cancer Neg Hx    Rectal cancer Neg Hx    Stomach cancer Neg Hx     Prior to Admission medications   Medication Sig Start Date End Date Taking? Authorizing Provider  acetaminophen (TYLENOL) 325 MG tablet Take 2 tablets (650 mg total) by mouth every 4 (four) hours as needed for mild pain, moderate pain, fever or headache. 11/14/22   Laural Benes, Clanford L, MD  allopurinol (ZYLOPRIM) 100 MG tablet Take 100 mg by mouth daily. 03/20/21   [provider]  atorvastatin (LIPITOR) 80 MG tablet Take 1 tablet (80 mg total) by mouth daily. Restart on 06/29/22 Patient taking differently: Take 80 mg by mouth at bedtime. Restart on 06/29/22 06/22/22   Catarina Hartshorn, MD  esomeprazole (NEXIUM) 40 MG capsule Take 40 mg by mouth 2 (two) times daily before a meal. 03/04/12   [provider]  gabapentin (NEURONTIN) 300 MG capsule Take 1 capsule by mouth at bedtime.    [provider]  HYDROcodone-acetaminophen (NORCO/VICODIN) 5-325 MG tablet Take 1 tablet by mouth every 6 (six) hours as needed.    [provider]  insulin aspart (NOVOLOG) 100 UNIT/ML FlexPen  Inject 2 Units into the skin 3 (three) times daily with meals. Give only if eats 50% or more of meal. 02/11/23   Johnson, Clanford L, MD  insulin glargine-yfgn (SEMGLEE) 100 UNIT/ML Pen Inject 7 Units into the skin daily. 02/11/23   Johnson, Clanford L, MD  levETIRAcetam (KEPPRA) 500 MG tablet Take 500 mg by mouth 2 (two) times daily.    [provider]  lipase/protease/amylase (CREON) 36000 UNITS CPEP capsule Take 36,000 Units by mouth 3 (three) times daily with meals.    [provider]  metoprolol succinate (TOPROL-XL) 25 MG 24 hr tablet Take 1.5 tablets (37.5 mg total) by mouth daily. 02/11/23   Johnson, Clanford L, MD  naloxone [facility]) nasal spray 4 mg/0.1 mL Place 1 spray into the nose once.    [provider]  ondansetron (ZOFRAN-ODT) 4 MG disintegrating tablet Take 4 mg by mouth every 6 (six) hours as needed. 01/21/23   [provider]  sodium bicarbonate 650 MG tablet Take 1 tablet (650 mg total) by mouth daily. 02/11/23   Johnson, Clanford L, MD  venlafaxine XR (EFFEXOR-XR) 150 MG 24 hr capsule Take 150 mg by mouth daily. 07/11/22   [provider]    Physical Exam: Vitals:   02/17/23 1730 02/17/23 1750 02/17/23 1800 02/17/23 1815  BP: (!) 101/41 103/63 (!) 86/70 99/73  Pulse: 77 80    Resp: Temp: (!) 97.4 F (36.3 C) 97.6 F (36.4 C)    TempSrc: Oral Oral    SpO2: 100% 100%    Weight:      Height:        Constitutional: NAD, calm, comfortable Vitals:   02/17/23 1730 02/17/23 1750 02/17/23 1800 02/17/23 1815  BP: (!) 101/41 103/63 (!) 86/70 99/73  Pulse: 77 80    Resp: Temp: (!) 97.4 F (36.3 C) 97.6 F (36.4 C)    TempSrc: Oral Oral    SpO2: 100% 100%    Weight:      Height:       Eyes: PERRL, lids and  conjunctivae normal ENMT: Mucous membranes are moist.  Neck: normal, supple, no masses, no thyromegaly Respiratory: clear to auscultation bilaterally, no wheezing. Normal respiratory effort. No  accessory muscle use.  Cardiovascular: Regular rate and rhythm, no murmurs / rubs / gallops. No extremity edema.  Extremties warm. Abdomen: no tenderness, no masses palpated. No hepatosplenomegaly. Bowel sounds positive.  Musculoskeletal: no clubbing / cyanosis. No joint deformity upper and lower extremities. Skin: no rashes, lesions, ulcers. No induration Neurologic: No apparent cranial nerve abnormality, moving extremities spontaneously Psychiatric: Normal judgment and insight. Alert and oriented x 3. Normal mood.   Labs on Admission: I have personally reviewed following labs and imaging studies  CBC: Recent Labs  Lab 02/17/23 1625 02/17/23 1735  WBC 17.2* 16.6*  NEUTROABS  --  14.4*  HGB 3.2* 3.8*  HCT 10.3* 12.6*  MCV 101.0* 102.4*  PLT 348 367   Basic Metabolic Panel: Recent Labs  Lab 02/17/23 1604  NA 135  K 4.6  CL 100  CO2 21*  GLUCOSE 146*  BUN 115*  CREATININE 3.26*  CALCIUM 7.5*   GFR: Estimated Creatinine Clearance: 12.7 mL/min (A) (by C-G formula based on SCr of 3.26 mg/dL (H)). Liver Function Tests: Recent Labs  Lab 02/17/23 1604  AST 25  ALT 11  ALKPHOS 94  BILITOT 0.6  PROT 4.3*  ALBUMIN 1.6*   Recent Labs  Lab 02/17/23 1735  LIPASE 21   CBG: Recent Labs  Lab 02/10/23 2020 02/11/23 0733 02/11/23 1124 02/11/23 1640  GLUCAP 72 93 107* 142*   Anemia Panel: Recent Labs    02/17/23 1735  FERRITIN 1,462*  TIBC 135*  IRON 51   Urine analysis:    Component Value Date/Time   COLORURINE RED (A) 02/04/2023 2340   APPEARANCEUR TURBID (A) 02/04/2023 2340   LABSPEC 1.014 02/04/2023 2340   PHURINE 5.0 02/04/2023 2340   GLUCOSEU >=500 (A) 02/04/2023 2340   GLUCOSEU NEGATIVE 03/28/2018 1224   HGBUR LARGE (A) 02/04/2023 2340   BILIRUBINUR NEGATIVE 02/04/2023 2340   KETONESUR NEGATIVE 02/04/2023 2340   PROTEINUR 100 (A) 02/04/2023 2340   UROBILINOGEN 1.0 03/28/2018 1224   NITRITE NEGATIVE 02/04/2023 2340   LEUKOCYTESUR SMALL (A)  02/04/2023 2340    Radiological Exams on Admission: CT ABDOMEN PELVIS WO CONTRAST  Result Date: 02/17/2023 CLINICAL DATA:  Concern for bowel obstruction. Patient reportedly has red stool. EXAM: CT ABDOMEN AND PELVIS WITHOUT CONTRAST TECHNIQUE: Multidetector CT imaging of the abdomen and pelvis was performed following the standard protocol without IV contrast. RADIATION DOSE REDUCTION: This exam was performed according to the departmental dose-optimization program which includes automated exposure control, adjustment of the mA and/or kV according to patient size and/or use of iterative reconstruction technique. COMPARISON:  CT abdomen pelvis dated 02/04/2023. FINDINGS: Lower chest: Moderate bilateral pleural effusions with associated atelectasis are partially imaged. The heart is mildly enlarged. The blood pool is hypoattenuating, suggestive of anemia. Hepatobiliary: No focal liver abnormality is seen. Status post cholecystectomy. No biliary dilatation. Pancreas: Atrophic. A 1.8 cm low-density lesion in the pancreatic tail appears unchanged. No pancreatic ductal dilatation or surrounding inflammatory changes. Spleen: Normal in size without focal abnormality. Adrenals/Urinary Tract: Adrenal glands are unremarkable. Bilateral punctate calcifications in both kidneys may represent nonobstructing calculi measuring up to 2 mm and/or vascular calcifications. No suspicious focal lesion or hydronephrosis on either side. Bladder is unremarkable. Stomach/Bowel: Stomach is within normal limits. The appendix is not definitely identified, however no pericecal inflammatory changes are noted to suggest acute appendicitis. There is  a mild bowel wall thickening and mucosal enhancement involving portions of the sigmoid colon and rectum. No significant surrounding inflammatory changes. No bowel obstruction. Vascular/Lymphatic: Aortic atherosclerosis. No enlarged abdominal or pelvic lymph nodes. Reproductive: Status post  hysterectomy. No adnexal masses. Other: No abdominal wall hernia or abnormality. No abdominopelvic ascites. Anasarca is noted Musculoskeletal: There has been slight progression since 02/04/2023 of a T12 compression deformity with approximately 50% height loss centrally and 3 mm retropulsion into the central canal. Fixation hardware is seen in the right femur. Fixation hardware in the lumbar spine from L1-L4. IMPRESSION: 1. Mild bowel wall thickening and mucosal enhancement involving portions of the sigmoid colon and rectum may reflect proctocolitis. No bowel obstruction. 2. Moderate bilateral pleural effusions with associated atelectasis. 3. Slight progression since 02/04/2023 of a T12 compression deformity with approximately 50% height loss centrally and 3 mm retropulsion into the central canal. 4. Low-density pancreatic lesion. When the patient is clinically stable and able to follow directions and hold their breath (preferably as an outpatient) further evaluation with dedicated abdominal MRI could be considered for further characterization. Aortic Atherosclerosis (ICD10-I70.0). Electronically Signed   By: Romona Curls M.D.   On: 02/17/2023 17:36    EKG: Independently reviewed.  Sinus rhythm, rate 87, QTc 470.  No significant changes from prior.  Assessment/Plan Principal Problem:   GI bleed Active Problems:   Acute renal failure superimposed on stage 4 chronic kidney disease   Acute metabolic encephalopathy   Acute on chronic anemia   Pancreatic lesion   Proctocolitis   Hypoalbuminemia   Diabetes mellitus   Essential hypertension   Gout   Cerebrovascular accident (CVA) due to embolism of cerebral artery   Assessment and Plan: * GI bleed With acute on chronic anemia.  Hgb down to 3.2, recent hemoglobin/14 on discharge from the hospital 8.2, she received 1 unit PRBC for hemoglobin of 6.7.  Reports bloody stools over the past 3 days, no hematemesis.  Positive NSAID use history. Not on  anticoagulation.  10/2020, capsule endoscopy-several small aphthous appearing ulcers scattered in small bowel,-mild IBD/Crohn's. Colonoscopy and EGD 2021 were unremarkable. - EDP talked with Dr. Levon Hedger, will see in consult in a.m., -1 unit transfused, transfused 2 more units  -IV Lasix 40 mg x 1  -Cautious with fluids and blood, CT abdomen and pelvis showing moderate bilateral pleural effusions -IV Protonix 40 twice daily -Trend H&H -Clear liquid diet, n.p.o. midnight -Has 1 peripheral access obtained via ultrasound by ED provider, may need PICC line in a.m.  Proctocolitis Presenting with bloody stools, history of Crohn's.  Leukocytosis of 17.2.  ESR elevated at >130.  Serum ferritin 1462.  Rules out for sepsis.  Denies pain with bowel movements.  CT abdomen and pelvis without contrast-  Mild bowel wall thickening and mucosal enhancement involving portions of the sigmoid colon and rectum may reflect proctocolitis. -IV ceftriaxone and metronidazole for now -Follow-up GI recs in a.m. -Reports dysuria, follow-up UA  Pancreatic lesion Incidental finding on CT today 02/17/2023-  Low-density pancreatic lesion. When the patient is clinically stable and able to follow directions and hold their breath (preferably as an outpatient) further evaluation with dedicated abdominal MRI could be considered for further characterization. -Follow-up as outpatient  Acute metabolic encephalopathy Patient initially confused in triage, mental status is improved after 1 unit PRBC.  Encephalopathy likely due to severe anemia, ??  Proctocolitis.  She has a leukocytosis of 17.2.  Also reports dysuria. - Monitor for now, defer to brain imaging for  now -Resume home Keppra, venlafaxine - Follow-up UA.  Acute renal failure superimposed on stage 4 chronic kidney disease AKI on CKD stage IV.  Cr- 3.26, last discharge creatinine 4/14- 1.8. In the setting of severe anemia GI bleed.  -Hold off on IV fluids, transfuse 3  units -IV Lasix 40 mg x 1 with transfusions -Resume daily oral bicarb  Hypoalbuminemia Albumin 1.6.  Calcium 7.5, corrected calcium - 9.4.  Diabetes mellitus - HgbA1c - SSI- S q4h  Essential hypertension Initial unreliable blood pressures.  Arterial line placed in ED. -Will use as needed IV labetalol 5mg  greater than 180 -Hold home antihypertensives metoprolol 37.5mg    Gout Resume allopurinol   DVT prophylaxis: SCDS Family Communication: None at bedside. CODE STATUS: Full code-confirmed with patient at bedside. Disposition Plan: > 2 days Consults called: GI Admission status: INPT stepdown I certify that at the point of admission it is my clinical judgment that the patient will require inpatient hospital care spanning beyond 2 midnights from the point of admission due to high intensity of service, high risk for further deterioration and high frequency of surveillance required.   Author: Onnie Boer, MD 02/17/2023 10:01 PM  For on call review [email or web address].

## 2023-02-17 NOTE — ED Triage Notes (Signed)
Pt presents with her husband stating pt has "red stool." Pt and family are poor historians. Husband is unable to confirm what pt's baseline is. Pt is confused during triage. Unknown duration of symptoms.

## 2023-02-17 NOTE — ED Notes (Signed)
Pt's husband signed blood consent with this RN and Shanda Bumps, RN as witnesses.

## 2023-02-17 NOTE — ED Provider Notes (Addendum)
[place] EMERGENCY DEPARTMENT AT [facility] Provider Note  CSN: [id number] Arrival date & time: 02/17/23 1516  Chief Complaint(s) GI Bleeding  HPI Heather [name] is a 72 y.o. female with PMH HTN, T2DM, CHF, CKD 4, chronic disease who presents emergency department for evaluation of GI bleeding.  History obtained from patient's husband who states that for the last 4 days patient has been having dark red stools.  History overall difficult to obtain as patient is largely unable to describe her symptoms today in the emergency room.  Denies blood thinner use.  Endorses mild crampy abdominal pain but denies chest pain, shortness of breath, nausea, vomiting or other systemic symptoms.   Past Medical History Past Medical History:  Diagnosis Date   Acute renal failure superimposed on stage 3 chronic kidney disease 01/21/2016   Anemia    Anxiety    B12 deficiency    Brain tumor (benign) 10/29/2020   Cellulitis and abscess    Abdomen and buttocks   Chronic diastolic CHF (congestive heart failure) 03/16/2022   CKD (chronic kidney disease) stage 3, GFR 30-59 ml/min    Closed fracture of distal end of femur, unspecified fracture morphology, initial encounter    Coronary arteriosclerosis 07/15/2018   Crohn disease    Depression    Diabetic neuropathy    feet   DJD (degenerative joint disease)    Right forminal stenosis C4-5   Essential hypertension    Femur fracture, right 03/30/2017   Folliculitis    GERD (gastroesophageal reflux disease)    Gout    Headache(784.0)    Hiatal hernia    History of blood transfusion    History of bronchitis    History of cardiac catheterization    No significant CAD 2012   History of kidney stones    surgery to remove   Insomnia    Irritable bowel syndrome    Mixed hyperlipidemia    Nausea & vomiting 12/22/2014   Osteoarthritis    Palpitations    Pneumonia    Reflux esophagitis    Salmonella    Stroke    Tinnitus    Right    Type 2 diabetes mellitus    Patient Active Problem List   Diagnosis Date Noted   Hyperosmolar hyperglycemic state (HHS) 02/05/2023   High anion gap metabolic acidosis 02/05/2023   Poor intravenous access 02/05/2023   Fracture 12/11/2022   Pseudohyponatremia 11/12/2022   Thrombocytosis 11/12/2022   DKA (diabetic ketoacidosis) 11/12/2022   Uncontrolled type 2 diabetes mellitus with hyperglycemia 11/11/2022   Acute kidney injury superimposed on chronic kidney disease 10/17/2022   DKA, type 2 10/16/2022   History of right shoulder fracture 10/15/2022   Aortic atherosclerosis 08/01/2022   Recurrent falls 07/04/2022   Humeral head fracture 07/04/2022   Right leg weakness 06/20/2022   Anxiety 06/20/2022   UTI (urinary tract infection) 06/20/2022   Malnutrition of moderate degree 06/20/2022   Hyperkalemia 06/19/2022   Type 2 diabetes mellitus with hyperosmolar nonketotic hyperglycemia 05/27/2022   Acute kidney failure 03/17/2022   Mixed diabetic hyperlipidemia associated with type 2 diabetes mellitus 03/16/2022   Chronic diastolic CHF (congestive heart failure) 03/16/2022   Decubitus ulcer of left buttock, stage 2 03/16/2022   Cellulitis of left hand 11/30/2021   Closed fracture of part of upper end of humerus 11/30/2021   At risk for falls 11/28/2021   Neoplasm of meninges 11/28/2021   Sepsis 11/28/2021   Normocytic anemia 11/28/2021   Cerebral thrombosis with cerebral  infarction 08/28/2021   Cerebrovascular accident (CVA) due to embolism of cerebral artery    Pressure injury of skin 08/26/2021   Acute respiratory failure with hypoxia 08/25/2021   Acute metabolic encephalopathy 08/25/2021   Malignant hypertension 08/25/2021   Thrombocytopenia 08/25/2021   Anemia due to chronic kidney disease 08/25/2021   Blurry vision, bilateral 08/16/2021   Hyponatremia 04/01/2021   COVID-19 03/30/2021   Senile osteoporosis 02/10/2021   Macrocytic anemia    E coli enteritis 01/09/2021    Acute pancreatitis 01/08/2021   Acute renal failure superimposed on stage 4 chronic kidney disease 01/07/2021   Diarrhea    Acute hypokalemia 01/06/2021   Long-term use of immunosuppressant medication-anticipated 11/28/2020   Elevated blood-pressure reading, without diagnosis of hypertension 05/30/2020   Meningioma 05/30/2020   Headache 05/17/2020   Hypokalemia 05/17/2020   Hypomagnesemia 05/17/2020   Dehydration 05/17/2020   Hypertensive urgency 05/17/2020   Visual disturbance 05/17/2020   Left hip pain 09/22/2019   Lumbar radiculopathy 09/22/2019   Postoperative anemia due to acute blood loss 09/18/2019   Postoperative urinary retention 09/18/2019   Closed fracture of distal end of radius 05/26/2019   Coronary arteriosclerosis 07/15/2018   Closed right hip fracture 04/08/2018   Elevated lipase    Crohn's disease of small intestine without complication    Protein-calorie malnutrition, severe 03/05/2018   Femur fracture, right 03/30/2017   Neck pain 03/12/2017   Acute kidney injury superimposed on CKD 01/25/2016   Neuropathy 03/02/2015   Pain in the chest    Chest pain 12/22/2014   Essential hypertension 12/22/2014   Nausea & vomiting 12/22/2014   Uncontrolled type 2 diabetes mellitus with hyperglycemia, with long-term current use of insulin 12/22/2014   Hiatal hernia 12/22/2014   GERD (gastroesophageal reflux disease) 12/22/2014   Gout 12/22/2014   Mixed hyperlipidemia 12/22/2014   Depression 12/22/2014   Type 2 diabetes mellitus without complications 12/22/2014   Fusion of spine, cervicothoracic region 11/01/2014   Pseudophakia, left eye 10/20/2014   Chronic kidney disease (CKD), stage IV (severe) 08/25/2014   Cervical stenosis of spine 08/26/2013   Vitamin B12 deficiency 07/08/2011   Personal history of failed moderate sedation 04/16/2011   Chronic Nausea and Vomiting - ? gastroapresis 04/16/2011   Early satiety 04/16/2011   Irritable bowel syndrome 04/16/2011    Benign paroxysmal positional vertigo 11/20/2010   TRICUSPID REGURGITATION, MODERATE 11/20/2010   PULMONARY HYPERTENSION, MILD 11/20/2010   LEG EDEMA, BILATERAL 11/20/2010   DYSPNEA 11/20/2010   Prolonged QT interval 11/20/2010   CHEST WALL PAIN, HX OF 11/20/2010   Home Medication(s) Prior to Admission medications   Medication Sig Start Date End Date Taking? Authorizing Provider  acetaminophen (TYLENOL) 325 MG tablet Take 2 tablets (650 mg total) by mouth every 4 (four) hours as needed for mild pain, moderate pain, fever or headache. 11/14/22   Laural Benes, Clanford L, MD  allopurinol (ZYLOPRIM) 100 MG tablet Take 100 mg by mouth daily. 03/20/21   [provider]  atorvastatin (LIPITOR) 80 MG tablet Take 1 tablet (80 mg total) by mouth daily. Restart on 06/29/22 Patient taking differently: Take 80 mg by mouth at bedtime. Restart on 06/29/22 06/22/22   Catarina Hartshorn, MD  esomeprazole (NEXIUM) 40 MG capsule Take 40 mg by mouth 2 (two) times daily before a meal. 03/04/12   [provider]  gabapentin (NEURONTIN) 300 MG capsule Take 1 capsule by mouth at bedtime.    [provider]  HYDROcodone-acetaminophen (NORCO/VICODIN) 5-325 MG tablet Take 1 tablet by mouth  every 6 (six) hours as needed.    [provider]  insulin aspart (NOVOLOG) 100 UNIT/ML FlexPen Inject 2 Units into the skin 3 (three) times daily with meals. Give only if eats 50% or more of meal. 02/11/23   Johnson, Clanford L, MD  insulin glargine-yfgn (SEMGLEE) 100 UNIT/ML Pen Inject 7 Units into the skin daily. 02/11/23   Johnson, Clanford L, MD  levETIRAcetam (KEPPRA) 500 MG tablet Take 500 mg by mouth 2 (two) times daily.    [provider]  lipase/protease/amylase (CREON) 36000 UNITS CPEP capsule Take 36,000 Units by mouth 3 (three) times daily with meals.    [provider]  metoprolol succinate (TOPROL-XL) 25 MG 24 hr tablet Take 1.5 tablets (37.5 mg total) by mouth daily. 02/11/23   Johnson,  Clanford L, MD  naloxone [facility]) nasal spray 4 mg/0.1 mL Place 1 spray into the nose once.    [provider]  ondansetron (ZOFRAN-ODT) 4 MG disintegrating tablet Take 4 mg by mouth every 6 (six) hours as needed. 01/21/23   [provider]  sodium bicarbonate 650 MG tablet Take 1 tablet (650 mg total) by mouth daily. 02/11/23   Johnson, Clanford L, MD  venlafaxine XR (EFFEXOR-XR) 150 MG 24 hr capsule Take 150 mg by mouth daily. 07/11/22   [provider]                                                                                                                                    Past Surgical History Past Surgical History:  Procedure Laterality Date   BACK SURGERY     c-spine surgery     03/2007   CARDIAC CATHETERIZATION     CHOLECYSTECTOMY     COLONOSCOPY  04/21/2011; 05/29/11   6/12: [place] - ?AVM at IC valve, inflammatory changes at IC valve [facility]); 7/12 - gessner; IC valve erosions, look chronic and probably Crohn's per path   colonoscopy  2017   [place]: random biopsies normal. TI normal   ESOPHAGOGASTRODUODENOSCOPY  05/29/11   normal   ESOPHAGOGASTRODUODENOSCOPY N/A 01/21/2016   Dr. Bosie Clos: normal   EYE SURGERY Bilateral    cataracts removed   FEMUR IM NAIL Right 04/10/2018   Procedure: INTRAMEDULLARY (IM) NAIL FEMORAL;  Surgeon: Durene Romans, MD;  Location: [facility] ORS;  Service: Orthopedics;  Laterality: Right;   FRACTURE SURGERY     GIVENS CAPSULE STUDY  11/17/2020   HARDWARE REMOVAL Right 04/10/2018   Procedure: HARDWARE REMOVAL RIGHT DISTAL FEMUR;  Surgeon: Durene Romans, MD;  Location: [facility] OR[facility];  Service: Orthopedics;  Laterality: Right;   HERNIA REPAIR     IR FLUORO GUIDE CV LINE RIGHT  04/01/2017   IR US GUIDE VASC ACCESS RIGHT  04/01/2017   JOINT REPLACEMENT Right    hip   OPEN REDUCTION INTERNAL FIXATION (ORIF) DISTAL RADIAL FRACTURE Right 05/28/2019   Procedure: OPEN REDUCTION INTERNAL FIXATION (ORIF) DISTAL RADIAL FRACTURE;  Surgeon: Ernest Mallick, MD;  Location: [facility] OR;  Service: Orthopedics;  Laterality: Right;  with block 90 minutes   ORIF FEMUR FRACTURE Right 03/31/2017   Procedure: OPEN REDUCTION INTERNAL FIXATION (ORIF) DISTAL FEMUR FRACTURE;  Surgeon: Durene Romans, MD;  Location: [facility] OR;  Service: Orthopedics;  Laterality: Right;   ORIF FEMUR FRACTURE Right 12/14/2022   Procedure: OPEN REDUCTION INTERNAL FIXATION (ORIF) DISTAL FEMUR FRACTURE;  Surgeon: Roby Lofts, MD;  Location: [facility] OR;  Service: Orthopedics;  Laterality: Right;   removal of kidney stone     Right knee arthroscopic surgery     TONSILLECTOMY     TOTAL ABDOMINAL HYSTERECTOMY     Family History Family History  Problem Relation Age of Onset   Diabetes Mother    Colon cancer Brother        POSSIBLY. Patient states diagnosed at age 27, then later states age 41. unclear and limited historian   Esophageal cancer Neg Hx    Rectal cancer Neg Hx    Stomach cancer Neg Hx     Social History Social History   Tobacco Use   Smoking status: Never   Smokeless tobacco: Never  Vaping Use   Vaping Use: Never used  Substance Use Topics   Alcohol use: No   Drug use: No   Allergies Cozaar [losartan], Quinolones, Cymbalta [duloxetine hcl], Sulfa antibiotics, Synalar [fluocinolone], Cipro [ciprofloxacin hcl], Diovan [valsartan], Glucophage [metformin], Ketalar [ketamine], Norco [hydrocodone-acetaminophen], Norvasc [amlodipine besylate], Nsaids, Tape, and Zestril [lisinopril]  Review of Systems Review of Systems  Gastrointestinal:  Positive for abdominal pain and blood in stool.    Physical Exam Vital Signs  I have reviewed the triage vital signs BP 103/63   Pulse 80   Temp 97.6 F (36.4 C) (Oral)   Resp 10   Ht 5\' 4"  (1.626 m)   Wt 50 kg   LMP  (LMP Unknown)   SpO2 100%   BMI 18.92 kg/m   Physical Exam Vitals and nursing note reviewed.  Constitutional:      General: She is not in acute distress.    Appearance: She is  well-developed.  HENT:     Head: Normocephalic and atraumatic.  Eyes:     Conjunctiva/sclera: Conjunctivae normal.  Cardiovascular:     Rate and Rhythm: Normal rate and regular rhythm.     Heart sounds: No murmur heard. Pulmonary:     Effort: Pulmonary effort is normal. No respiratory distress.     Breath sounds: Normal breath sounds.  Abdominal:     Palpations: Abdomen is soft.     Tenderness: There is abdominal tenderness.  Musculoskeletal:        General: No swelling.     Cervical back: Neck supple.  Skin:    General: Skin is warm and dry.     Capillary Refill: Capillary refill takes less than 2 seconds.  Neurological:     Mental Status: She is alert.  Psychiatric:        Mood and Affect: Mood normal.     ED Results and Treatments Labs (all labs ordered are listed, but only abnormal results are displayed) Labs Reviewed  COMPREHENSIVE METABOLIC PANEL - Abnormal; Notable for the following components:      Result Value   CO2 21 (*)    Glucose, Bld 146 (*)    BUN 115 (*)    Creatinine, Ser 3.26 (*)    Calcium 7.5 (*)    Total Protein 4.3 (*)  Albumin 1.6 (*)    GFR, Estimated 15 (*)    All other components within normal limits  CBC - Abnormal; Notable for the following components:   WBC 17.2 (*)    RBC 1.02 (*)    Hemoglobin 3.2 (*)    HCT 10.3 (*)    MCV 101.0 (*)    RDW 16.1 (*)    All other components within normal limits  CBC WITH DIFFERENTIAL/PLATELET - Abnormal; Notable for the following components:   WBC 16.6 (*)    RBC 1.23 (*)    Hemoglobin 3.8 (*)    HCT 12.6 (*)    MCV 102.4 (*)    RDW 16.4 (*)    Neutro Abs 14.4 (*)    Abs Immature Granulocytes 0.48 (*)    All other components within normal limits  LIPASE, BLOOD  SEDIMENTATION RATE  C-REACTIVE PROTEIN  IRON AND TIBC  FERRITIN  POC OCCULT BLOOD, ED  TYPE AND SCREEN  PREPARE RBC (CROSSMATCH)                                                                                                                           Radiology CT ABDOMEN PELVIS WO CONTRAST  Result Date: 02/17/2023 CLINICAL DATA:  Concern for bowel obstruction. Patient reportedly has red stool. EXAM: CT ABDOMEN AND PELVIS WITHOUT CONTRAST TECHNIQUE: Multidetector CT imaging of the abdomen and pelvis was performed following the standard protocol without IV contrast. RADIATION DOSE REDUCTION: This exam was performed according to the departmental dose-optimization program which includes automated exposure control, adjustment of the mA and/or kV according to patient size and/or use of iterative reconstruction technique. COMPARISON:  CT abdomen pelvis dated 02/04/2023. FINDINGS: Lower chest: Moderate bilateral pleural effusions with associated atelectasis are partially imaged. The heart is mildly enlarged. The blood pool is hypoattenuating, suggestive of anemia. Hepatobiliary: No focal liver abnormality is seen. Status post cholecystectomy. No biliary dilatation. Pancreas: Atrophic. A 1.8 cm low-density lesion in the pancreatic tail appears unchanged. No pancreatic ductal dilatation or surrounding inflammatory changes. Spleen: Normal in size without focal abnormality. Adrenals/Urinary Tract: Adrenal glands are unremarkable. Bilateral punctate calcifications in both kidneys may represent nonobstructing calculi measuring up to 2 mm and/or vascular calcifications. No suspicious focal lesion or hydronephrosis on either side. Bladder is unremarkable. Stomach/Bowel: Stomach is within normal limits. The appendix is not definitely identified, however no pericecal inflammatory changes are noted to suggest acute appendicitis. There is a mild bowel wall thickening and mucosal enhancement involving portions of the sigmoid colon and rectum. No significant surrounding inflammatory changes. No bowel obstruction. Vascular/Lymphatic: Aortic atherosclerosis. No enlarged abdominal or pelvic lymph nodes. Reproductive: Status post hysterectomy. No adnexal  masses. Other: No abdominal wall hernia or abnormality. No abdominopelvic ascites. Anasarca is noted Musculoskeletal: There has been slight progression since 02/04/2023 of a T12 compression deformity with approximately 50% height loss centrally and 3 mm retropulsion into the central canal. Fixation hardware is seen in the right femur. Fixation hardware  in the lumbar spine from L1-L4. IMPRESSION: 1. Mild bowel wall thickening and mucosal enhancement involving portions of the sigmoid colon and rectum may reflect proctocolitis. No bowel obstruction. 2. Moderate bilateral pleural effusions with associated atelectasis. 3. Slight progression since 02/04/2023 of a T12 compression deformity with approximately 50% height loss centrally and 3 mm retropulsion into the central canal. 4. Low-density pancreatic lesion. When the patient is clinically stable and able to follow directions and hold their breath (preferably as an outpatient) further evaluation with dedicated abdominal MRI could be considered for further characterization. Aortic Atherosclerosis (ICD10-I70.0). Electronically Signed   By: Romona Curls M.D.   On: 02/17/2023 17:36    Pertinent labs & imaging results that were available during my care of the patient were reviewed by me and considered in my medical decision making (see MDM for details).  Medications Ordered in ED Medications  lactated ringers bolus 1,000 mL (has no administration in time range)  albumin human 25 % solution 25 g (has no administration in time range)  0.9 %  sodium chloride infusion (Manually program via Guardrails IV Fluids) (has no administration in time range)  pantoprazole (PROTONIX) injection 40 mg (has no administration in time range)  calcium gluconate 1 g/ 50 mL sodium chloride IVPB (has no administration in time range)                                                                                                                                     Procedures .Critical  Care  Performed by: Glendora Score, MD Authorized by: Glendora Score, MD   Critical care provider statement:    Critical care time (minutes):  30   Critical care was necessary to treat or prevent imminent or life-threatening deterioration of the following conditions: Critical anemia requiring blood transfusion.   Critical care was time spent personally by me on the following activities:  Development of treatment plan with patient or surrogate, discussions with consultants, evaluation of patient's response to treatment, examination of patient, ordering and review of laboratory studies, ordering and review of radiographic studies, ordering and performing treatments and interventions, pulse oximetry, re-evaluation of patient's condition and review of old charts Ultrasound ED Peripheral IV (Provider)  Date/Time: 02/17/2023 6:08 PM  Performed by: Glendora Score, MD Authorized by: Glendora Score, MD   Procedure details:    Indications: multiple failed IV attempts     Skin Prep: chlorhexidine gluconate     Location: L brachial.   Angiocath:  20 G   Bedside Ultrasound Guided: Yes     Images: not archived     Patient tolerated procedure without complications: Yes     Dressing applied: Yes   ARTERIAL LINE  Date/Time: 02/17/2023 7:04 PM  Performed by: Glendora Score, MD Authorized by: Glendora Score, MD   Consent:    Consent obtained:  Emergent situation Indications:    Indications: hemodynamic monitoring   Pre-procedure details:  Skin preparation:  Alcohol Sedation:    Sedation type:  None Anesthesia:    Anesthesia method:  None Procedure details:    Location:  L radial   Placement technique:  Ultrasound guided and Seldinger   Number of attempts:  2   Transducer: waveform confirmed   Post-procedure details:    Post-procedure:  Sutured and sterile dressing applied   CMS:  Normal   Procedure completion:  Tolerated well, no immediate complications   (Note, picture above  with incorrect patient name from previous exam.  Picture above does represent venous access for the appropriate patient Soyla Bainter)  (including critical care time)  Medical Decision Making / ED Course   This patient presents to the ED for concern of melena, this involves an extensive number of treatment options, and is a complaint that carries with it a high risk of complications and morbidity.  The differential diagnosis includes upper GI bleed including PUD, varices, boerhaave's, Mallory Weiss tear, aortoenteric fistula versus lower GI bleed including mass, diverticulosis/diverticulitis, hemorrhoids, anal fissure, mesenteric ischemia, aortoenteric fistula, colitis  MDM: Patient seen emergency room for evaluation of melena.  Physical exam with mild tenderness in the lower abdomen, significant pallor.  Laboratory evaluation is highly concerning with a hemoglobin of 3.2 with an MCV of 101.0, leukocytosis 17.2, new worsening AKI with BUN 115, creatinine 3.26.,  Hypocalcemia to 7.5, hypoalbuminemia to 1.6.  It appears the patient's thrombocytopenia has resolved and that due to significant anemia, I did send a repeat sample to confirm that this was not a laboratory error.  Repeat CBC showing a hemoglobin of 3.8 and again a normal platelet count.  CT abdomen pelvis with some mild proctocolitis, redemonstration of pancreatic lesion but is otherwise unremarkable.  I spoke with Dr. Levon Hedger of gastroenterology who is recommending PPI, resuscitation and gastroenterology will evaluate the patient inpatient.  Recommending n.p.o. at midnight.  Patient then admitted.  Of note, patient has a chronic humerus injury on the right making blood pressure difficult to obtain on that side, access overall difficult and the only true line that I could get was an ultrasound-guided left brachial line which interferes with blood pressure measurements.  Blood pressure is erratic when taken more proximally in the arm and in  the setting of severe anemia, I placed an A-line for accurate blood pressures in the left radial.   Additional history obtained: -Additional history obtained from multiple family members -External records from outside source obtained and reviewed including: Chart review including previous notes, labs, imaging, consultation notes   Lab Tests: -I ordered, reviewed, and interpreted labs.   The pertinent results include:   Labs Reviewed  COMPREHENSIVE METABOLIC PANEL - Abnormal; Notable for the following components:      Result Value   CO2 21 (*)    Glucose, Bld 146 (*)    BUN 115 (*)    Creatinine, Ser 3.26 (*)    Calcium 7.5 (*)    Total Protein 4.3 (*)    Albumin 1.6 (*)    GFR, Estimated 15 (*)    All other components within normal limits  CBC - Abnormal; Notable for the following components:   WBC 17.2 (*)    RBC 1.02 (*)    Hemoglobin 3.2 (*)    HCT 10.3 (*)    MCV 101.0 (*)    RDW 16.1 (*)    All other components within normal limits  CBC WITH DIFFERENTIAL/PLATELET - Abnormal; Notable for the following components:   WBC 16.6 (*)  RBC 1.23 (*)    Hemoglobin 3.8 (*)    HCT 12.6 (*)    MCV 102.4 (*)    RDW 16.4 (*)    Neutro Abs 14.4 (*)    Abs Immature Granulocytes 0.48 (*)    All other components within normal limits  LIPASE, BLOOD  SEDIMENTATION RATE  C-REACTIVE PROTEIN  IRON AND TIBC  FERRITIN  POC OCCULT BLOOD, ED  TYPE AND SCREEN  PREPARE RBC (CROSSMATCH)      EKG   EKG Interpretation  Date/Time:  Sunday February 17 2023 15:44:35 EDT Ventricular Rate:  87 PR Interval:  163 QRS Duration: 93 QT Interval:  390 QTC Calculation: 470 R Axis:   54 Text Interpretation: Sinus rhythm Baseline wander in lead(s) II III aVF V1 V2 Confirmed by Ariana Juul ([id number]) on 02/17/2023 4:20:49 PM         Imaging Studies ordered: I ordered imaging studies including CTAP I independently visualized and interpreted imaging. I agree with the radiologist  interpretation   Medicines ordered and prescription drug management: Meds ordered this encounter  Medications   lactated ringers bolus 1,000 mL   albumin human 25 % solution 25 g   0.9 %  sodium chloride infusion (Manually program via Guardrails IV Fluids)   pantoprazole (PROTONIX) injection 40 mg   calcium gluconate 1 g/ 50 mL sodium chloride IVPB    -I have reviewed the patients home medicines and have made adjustments as needed  Critical interventions Multiple blood transfusions  Consultations Obtained: I requested consultation with the gastroenterologist Dr. Levon Hedger,  and discussed lab and imaging findings as well as pertinent plan - they recommend: PPI, resuscitation, medical admission   Cardiac Monitoring: The patient was maintained on a cardiac monitor.  I personally viewed and interpreted the cardiac monitored which showed an underlying rhythm of: NSR  Social Determinants of Health:  Factors impacting patients care include: none   Reevaluation: After the interventions noted above, I reevaluated the patient and found that they have :improved  Co morbidities that complicate the patient evaluation  Past Medical History:  Diagnosis Date   Acute renal failure superimposed on stage 3 chronic kidney disease 01/21/2016   Anemia    Anxiety    B12 deficiency    Brain tumor (benign) 10/29/2020   Cellulitis and abscess    Abdomen and buttocks   Chronic diastolic CHF (congestive heart failure) 03/16/2022   CKD (chronic kidney disease) stage 3, GFR 30-59 ml/min    Closed fracture of distal end of femur, unspecified fracture morphology, initial encounter    Coronary arteriosclerosis 07/15/2018   Crohn disease    Depression    Diabetic neuropathy    feet   DJD (degenerative joint disease)    Right forminal stenosis C4-5   Essential hypertension    Femur fracture, right 03/30/2017   Folliculitis    GERD (gastroesophageal reflux disease)    Gout    Headache(784.0)     Hiatal hernia    History of blood transfusion    History of bronchitis    History of cardiac catheterization    No significant CAD 2012   History of kidney stones    surgery to remove   Insomnia    Irritable bowel syndrome    Mixed hyperlipidemia    Nausea & vomiting 12/22/2014   Osteoarthritis    Palpitations    Pneumonia    Reflux esophagitis    Salmonella    Stroke    Tinnitus  Right   Type 2 diabetes mellitus       Dispostion: I considered admission for this patient, and due to his severe anemia patient require hospital admission     Final Clinical Impression(s) / ED Diagnoses Final diagnoses:  Gastrointestinal hemorrhage, unspecified gastrointestinal hemorrhage type     @    Glendora Score, MD 02/17/23 1810    Glendora Score, MD 02/17/23 1905

## 2023-02-17 NOTE — Assessment & Plan Note (Signed)
Albumin 1.6.  Calcium 7.5, corrected calcium - 9.4.

## 2023-02-17 NOTE — Assessment & Plan Note (Signed)
Resume allopurinol 

## 2023-02-17 NOTE — Assessment & Plan Note (Addendum)
AKI on CKD stage IV.  Cr- 3.26, last discharge creatinine 4/14- 1.8. In the setting of severe anemia GI bleed.  -Hold off on IV fluids, transfuse 3 units -IV Lasix 40 mg x 1 with transfusions -Resume daily oral bicarb

## 2023-02-18 DIAGNOSIS — D62 Acute posthemorrhagic anemia: Secondary | ICD-10-CM | POA: Diagnosis not present

## 2023-02-18 DIAGNOSIS — K8689 Other specified diseases of pancreas: Secondary | ICD-10-CM

## 2023-02-18 DIAGNOSIS — K50918 Crohn's disease, unspecified, with other complication: Secondary | ICD-10-CM

## 2023-02-18 DIAGNOSIS — K625 Hemorrhage of anus and rectum: Secondary | ICD-10-CM | POA: Diagnosis not present

## 2023-02-18 DIAGNOSIS — K922 Gastrointestinal hemorrhage, unspecified: Secondary | ICD-10-CM | POA: Diagnosis not present

## 2023-02-18 LAB — GLUCOSE, CAPILLARY
Glucose-Capillary: 167 mg/dL — ABNORMAL HIGH (ref 70–99)
Glucose-Capillary: 141 mg/dL — ABNORMAL HIGH (ref 70–99)
Glucose-Capillary: 106 mg/dL — ABNORMAL HIGH (ref 70–99)
Glucose-Capillary: 66 mg/dL — ABNORMAL LOW (ref 70–99)
Glucose-Capillary: 86 mg/dL (ref 70–99)
Glucose-Capillary: 45 mg/dL — ABNORMAL LOW (ref 70–99)
Glucose-Capillary: 108 mg/dL — ABNORMAL HIGH (ref 70–99)

## 2023-02-18 LAB — TYPE AND SCREEN: ABO/RH(D): O POS

## 2023-02-18 LAB — CBC
HCT: 27.6 % — ABNORMAL LOW (ref 36.0–46.0)
Hemoglobin: 9.5 g/dL — ABNORMAL LOW (ref 12.0–15.0)
MCH: 29.9 pg (ref 26.0–34.0)
MCHC: 34.4 g/dL (ref 30.0–36.0)
MCV: 86.8 fL (ref 80.0–100.0)
Platelets: 249 10*3/uL (ref 150–400)
RBC: 3.18 MIL/uL — ABNORMAL LOW (ref 3.87–5.11)
RDW: 15.8 % — ABNORMAL HIGH (ref 11.5–15.5)
WBC: 13.6 10*3/uL — ABNORMAL HIGH (ref 4.0–10.5)
nRBC: 0.1 % (ref 0.0–0.2)

## 2023-02-18 LAB — BASIC METABOLIC PANEL
Anion gap: 12 (ref 5–15)
BUN: 115 mg/dL — ABNORMAL HIGH (ref 8–23)
CO2: 21 mmol/L — ABNORMAL LOW (ref 22–32)
Calcium: 7.4 mg/dL — ABNORMAL LOW (ref 8.9–10.3)
Chloride: 102 mmol/L (ref 98–111)
Creatinine, Ser: 3.13 mg/dL — ABNORMAL HIGH (ref 0.44–1.00)
GFR, Estimated: 15 mL/min — ABNORMAL LOW (ref >60)
Glucose, Bld: 132 mg/dL — ABNORMAL HIGH (ref 70–99)
Potassium: 4.4 mmol/L (ref 3.5–5.1)
Sodium: 135 mmol/L (ref 135–145)

## 2023-02-18 LAB — BPAM RBC
Blood Product Expiration Date: 202405232359
ISSUE DATE / TIME: 202404212113
Unit Type and Rh: 5100

## 2023-02-18 LAB — URINALYSIS, ROUTINE W REFLEX MICROSCOPIC
Bilirubin Urine: NEGATIVE
Glucose, UA: NEGATIVE mg/dL
Ketones, ur: NEGATIVE mg/dL
Nitrite: NEGATIVE
Protein, ur: 100 mg/dL — AB
Specific Gravity, Urine: 1.013 (ref 1.005–1.030)
WBC, UA: >50 WBC/hpf (ref 0–5)
pH: 5 (ref 5.0–8.0)

## 2023-02-18 LAB — HEMOGLOBIN A1C
Hgb A1c MFr Bld: 6.4 % — ABNORMAL HIGH (ref 4.8–5.6)
Mean Plasma Glucose: 136.98 mg/dL

## 2023-02-18 LAB — C-REACTIVE PROTEIN: CRP: 2.3 mg/dL — ABNORMAL HIGH (ref <1.0)

## 2023-02-18 LAB — MRSA NEXT GEN BY PCR, NASAL: MRSA by PCR Next Gen: DETECTED — AB

## 2023-02-18 MED ORDER — OCUVITE-LUTEIN PO CAPS
1.0000 | ORAL_CAPSULE | Freq: Every day | ORAL | Status: DC
  Administered 2023-02-18 – 2023-02-25 (×6): 1 via ORAL
  Filled 2023-02-18 (×6): qty 1

## 2023-02-18 MED ORDER — PROSOURCE PLUS PO LIQD
30.0000 mL | Freq: Three times a day (TID) | ORAL | Status: DC
  Administered 2023-02-18 – 2023-02-20 (×2): 30 mL via ORAL
  Filled 2023-02-18 (×8): qty 30

## 2023-02-18 MED ORDER — MUPIROCIN 2 % EX OINT
TOPICAL_OINTMENT | Freq: Two times a day (BID) | CUTANEOUS | Status: DC
  Administered 2023-02-21 – 2023-02-24 (×2): 1 via NASAL
  Filled 2023-02-18 (×2): qty 22

## 2023-02-18 MED ORDER — FENTANYL CITRATE PF 50 MCG/ML IJ SOSY
12.5000 ug | PREFILLED_SYRINGE | INTRAMUSCULAR | Status: DC | PRN
  Administered 2023-02-18 – 2023-02-23 (×7): 12.5 ug via INTRAVENOUS
  Filled 2023-02-18 (×8): qty 1

## 2023-02-18 MED ORDER — DEXTROSE 50 % IV SOLN
12.5000 g | INTRAVENOUS | Status: AC
  Administered 2023-02-18: 12.5 g via INTRAVENOUS

## 2023-02-18 MED ORDER — METOCLOPRAMIDE HCL 5 MG/ML IJ SOLN
5.0000 mg | Freq: Four times a day (QID) | INTRAMUSCULAR | Status: DC | PRN
  Administered 2023-02-18 – 2023-02-24 (×6): 5 mg via INTRAVENOUS
  Filled 2023-02-18 (×7): qty 2

## 2023-02-18 MED ORDER — OXYCODONE HCL 5 MG PO TABS
5.0000 mg | ORAL_TABLET | Freq: Once | ORAL | Status: AC | PRN
  Administered 2023-02-18: 5 mg via ORAL
  Filled 2023-02-18: qty 1

## 2023-02-18 MED ORDER — PEG 3350-KCL-NA BICARB-NACL 420 G PO SOLR
4000.0000 mL | Freq: Once | ORAL | Status: AC
  Administered 2023-02-18: 4000 mL via ORAL

## 2023-02-18 MED ORDER — PROSIGHT PO TABS: 1.0000 | ORAL_TABLET | Freq: Every day | ORAL | Status: DC

## 2023-02-18 MED ORDER — DEXTROSE 50 % IV SOLN
INTRAVENOUS | Status: AC
  Filled 2023-02-18: qty 50

## 2023-02-18 NOTE — Progress Notes (Signed)
Hypoglycemic Event  CBG: 66  Treatment: D50 25 mL (12.5 gm)  Symptoms: None  Follow-up CBG: Time:1155 CBG Result:86  Possible Reasons for Event: Inadequate meal intake - patient is NPO for GI consult for possible procedure.

## 2023-02-18 NOTE — Progress Notes (Signed)
PROGRESS NOTE    Heather [name]  HQI:[id number] DOB: 02-22-1951 DOA: 02/17/2023 PCP: [name], MD   Brief Narrative:    Heather [name] is a 72 y.o. female with medical history significant for diabetes mellitus, Crohn's disease, CKD 4, diastolic CHF, pulmonary hypertension, gout. Patient presented to the ED with reports of dark red stools. Per notes, patient was confused in triage.  After transfusion in ED, mental status improved.  Patient was admitted with symptomatic acute blood loss anemia in the setting of active GI bleed.  She has had 3 unit PRBC transfusion with improvement in hemoglobin levels and is awaiting GI evaluation.  Assessment & Plan:   Principal Problem:   GI bleed Active Problems:   Acute renal failure superimposed on stage 4 chronic kidney disease   Acute metabolic encephalopathy   Acute on chronic anemia   Pancreatic lesion   Proctocolitis   Hypoalbuminemia   Diabetes mellitus   Essential hypertension   Gout   Cerebrovascular accident (CVA) due to embolism of cerebral artery  Assessment and Plan:  Acute symptomatic blood loss anemia secondary to GI bleed With acute on chronic anemia.  Hgb down to 3.2, recent hemoglobin/14 on discharge from the hospital 8.2, she received 1 unit PRBC for hemoglobin of 6.7.  Reports bloody stools over the past 3 days, no hematemesis.  Positive NSAID use history. Not on anticoagulation.  10/2020, capsule endoscopy-several small aphthous appearing ulcers scattered in small bowel,-mild IBD/Crohn's. Colonoscopy and EGD 2021 were unremarkable. -Status post 3 unit PRBC transfusion -Cautious with fluids and blood, CT abdomen and pelvis showing moderate bilateral pleural effusions -IV Protonix 40 twice daily -Trend H&H with improvement noted -Continue n.p.o. and IV fluid -Has 1 peripheral access obtained via ultrasound by ED provider, may need PICC line in a.m. -Appreciate GI evaluation   Proctocolitis Presenting with  bloody stools, history of Crohn's.  Leukocytosis of 17.2.  ESR elevated at >130.  Serum ferritin 1462.  Rules out for sepsis.  Denies pain with bowel movements.  CT abdomen and pelvis without contrast-  Mild bowel wall thickening and mucosal enhancement involving portions of the sigmoid colon and rectum may reflect proctocolitis. -IV ceftriaxone and metronidazole for now -Follow-up GI recs  -Reports dysuria, follow-up UA   Pancreatic lesion Incidental finding on CT today 02/17/2023-  Low-density pancreatic lesion. When the patient is clinically stable and able to follow directions and hold their breath (preferably as an outpatient) further evaluation with dedicated abdominal MRI could be considered for further characterization. -Follow-up as outpatient   Acute metabolic encephalopathy Patient initially confused in triage, mental status is improved after 1 unit PRBC.  Encephalopathy likely due to severe anemia, ??  Proctocolitis.  She has a leukocytosis of 17.2.  Also reports dysuria. - Monitor for now, defer to brain imaging for now -Resume home Keppra, venlafaxine - Follow-up UA.   Acute renal failure superimposed on stage 4 chronic kidney disease AKI on CKD stage IV.  Cr- 3.26, last discharge creatinine 4/14- 1.8. In the setting of severe anemia GI bleed.  -Continue daily oral bicarbonate and administer IV fluid for now while NPO   Hypoalbuminemia Albumin 1.6.  Calcium 7.5, corrected calcium - 9.4.   Diabetes mellitus - HgbA1c pending - SSI- S q4h   Essential hypertension Initial unreliable blood pressures.  Arterial line placed in ED. -Will use as needed IV labetalol 5mg  greater than 180 -Hold home antihypertensives metoprolol 37.5mg     Gout Resume allopurinol    DVT prophylaxis:  SCDs Code Status: Full Family Communication: None at bedside Disposition Plan: GI evaluation pending Status is: Inpatient Remains inpatient appropriate because: Need for IV  medications.  Consultants:  GI  Procedures:  None  Antimicrobials:  Anti-infectives (From admission, onward)    Start     Dose/Rate Route Frequency Ordered Stop   02/17/23 2200  cefTRIAXone (ROCEPHIN) 2 g in sodium chloride 0.9 % 100 mL IVPB        2 g 200 mL/hr over 30 Minutes Intravenous Every 24 hours 02/17/23 2155     02/17/23 2200  metroNIDAZOLE (FLAGYL) IVPB 500 mg        500 mg 100 mL/hr over 60 Minutes Intravenous Every 12 hours 02/17/23 2155        Subjective: Patient seen and evaluated today with no new acute complaints or concerns. No acute concerns or events noted overnight.  Objective: Vitals:   02/18/23 0307 02/18/23 0400 02/18/23 0500 02/18/23 0600  BP:      Pulse: 69 65 66 74  Resp: 19 20 (!) 23 18  Temp: (!) 97.5 F (36.4 C)     TempSrc: Oral     SpO2: 100% 100% 100% 100%  Weight:   55 kg   Height:        Intake/Output Summary (Last 24 hours) at 02/18/2023 0920 Last data filed at 02/18/2023 0839 Gross per 24 hour  Intake 1304.49 ml  Output 400 ml  Net 904.49 ml   Filed Weights   02/17/23 1533 02/17/23 2028 02/18/23 0500  Weight: 50 kg 55 kg 55 kg    Examination:  General exam: Appears calm and comfortable  Respiratory system: Clear to auscultation. Respiratory effort normal. Cardiovascular system: S1 & S2 heard, RRR.  Gastrointestinal system: Abdomen is soft Central nervous system: Alert and awake Extremities: No edema Skin: No significant lesions noted Psychiatry: Flat affect.    Data Reviewed: I have personally reviewed following labs and imaging studies  CBC: Recent Labs  Lab 02/17/23 1625 02/17/23 1735 02/18/23 0634  WBC 17.2* 16.6* 13.6*  NEUTROABS  --  14.4*  --   HGB 3.2* 3.8* 9.5*  HCT 10.3* 12.6* 27.6*  MCV 101.0* 102.4* 86.8  PLT 348 367 249   Basic Metabolic Panel: Recent Labs  Lab 02/17/23 1604 02/18/23 0634  NA 135 135  K 4.6 4.4  CL 100 102  CO2 21* 21*  GLUCOSE 146* 132*  BUN 115* 115*  CREATININE  3.26* 3.13*  CALCIUM 7.5* 7.4*   GFR: Estimated Creatinine Clearance: 14.4 mL/min (A) (by C-G formula based on SCr of 3.13 mg/dL (H)). Liver Function Tests: Recent Labs  Lab 02/17/23 1604  AST 25  ALT 11  ALKPHOS 94  BILITOT 0.6  PROT 4.3*  ALBUMIN 1.6*   Recent Labs  Lab 02/17/23 1735  LIPASE 21   No results for input(s): "AMMONIA" in the last 168 hours. Coagulation Profile: No results for input(s): "INR", "PROTIME" in the last 168 hours. Cardiac Enzymes: No results for input(s): "CKTOTAL", "CKMB", "CKMBINDEX", "TROPONINI" in the last 168 hours. BNP (last 3 results) No results for input(s): "PROBNP" in the last 8760 hours. HbA1C: No results for input(s): "HGBA1C" in the last 72 hours. CBG: Recent Labs  Lab 02/17/23 2106 02/17/23 2307 02/18/23 0408 02/18/23 0558 02/18/23 0742  GLUCAP 157* 183* 167* 141* 106*   Lipid Profile: No results for input(s): "CHOL", "HDL", "LDLCALC", "TRIG", "CHOLHDL", "LDLDIRECT" in the last 72 hours. Thyroid Function Tests: No results for input(s): "TSH", "T4TOTAL", "FREET4", "T3FREE", "  THYROIDAB" in the last 72 hours. Anemia Panel: Recent Labs    02/17/23 1735  FERRITIN 1,462*  TIBC 135*  IRON 51   Sepsis Labs: No results for input(s): "PROCALCITON", "LATICACIDVEN" in the last 168 hours.  Recent Results (from the past 240 hour(s))  MRSA Next Gen by PCR, Nasal     Status: Abnormal   Collection Time: 02/17/23  7:35 PM   Specimen: Nasal Mucosa; Nasal Swab  Result Value Ref Range Status   MRSA by PCR Next Gen DETECTED (A) NOT DETECTED Final    Comment: RESULT CALLED TO, READ BACK BY AND VERIFIED WITH: KING J @ 0747 ON [id number] BY HENDERSON L (NOTE) The GeneXpert MRSA Assay (FDA approved for NASAL specimens only), is one component of a comprehensive MRSA colonization surveillance program. It is not intended to diagnose MRSA infection nor to guide or monitor treatment for MRSA infections. Test performance is not FDA approved in  patients less than 67 years old. Performed at [facility], [address], [place], [place] [address]          Radiology Studies: CT ABDOMEN PELVIS WO CONTRAST  Result Date: 02/17/2023 CLINICAL DATA:  Concern for bowel obstruction. Patient reportedly has red stool. EXAM: CT ABDOMEN AND PELVIS WITHOUT CONTRAST TECHNIQUE: Multidetector CT imaging of the abdomen and pelvis was performed following the standard protocol without IV contrast. RADIATION DOSE REDUCTION: This exam was performed according to the departmental dose-optimization program which includes automated exposure control, adjustment of the mA and/or kV according to patient size and/or use of iterative reconstruction technique. COMPARISON:  CT abdomen pelvis dated 02/04/2023. FINDINGS: Lower chest: Moderate bilateral pleural effusions with associated atelectasis are partially imaged. The heart is mildly enlarged. The blood pool is hypoattenuating, suggestive of anemia. Hepatobiliary: No focal liver abnormality is seen. Status post cholecystectomy. No biliary dilatation. Pancreas: Atrophic. A 1.8 cm low-density lesion in the pancreatic tail appears unchanged. No pancreatic ductal dilatation or surrounding inflammatory changes. Spleen: Normal in size without focal abnormality. Adrenals/Urinary Tract: Adrenal glands are unremarkable. Bilateral punctate calcifications in both kidneys may represent nonobstructing calculi measuring up to 2 mm and/or vascular calcifications. No suspicious focal lesion or hydronephrosis on either side. Bladder is unremarkable. Stomach/Bowel: Stomach is within normal limits. The appendix is not definitely identified, however no pericecal inflammatory changes are noted to suggest acute appendicitis. There is a mild bowel wall thickening and mucosal enhancement involving portions of the sigmoid colon and rectum. No significant surrounding inflammatory changes. No bowel obstruction. Vascular/Lymphatic: Aortic  atherosclerosis. No enlarged abdominal or pelvic lymph nodes. Reproductive: Status post hysterectomy. No adnexal masses. Other: No abdominal wall hernia or abnormality. No abdominopelvic ascites. Anasarca is noted Musculoskeletal: There has been slight progression since 02/04/2023 of a T12 compression deformity with approximately 50% height loss centrally and 3 mm retropulsion into the central canal. Fixation hardware is seen in the right femur. Fixation hardware in the lumbar spine from L1-L4. IMPRESSION: 1. Mild bowel wall thickening and mucosal enhancement involving portions of the sigmoid colon and rectum may reflect proctocolitis. No bowel obstruction. 2. Moderate bilateral pleural effusions with associated atelectasis. 3. Slight progression since 02/04/2023 of a T12 compression deformity with approximately 50% height loss centrally and 3 mm retropulsion into the central canal. 4. Low-density pancreatic lesion. When the patient is clinically stable and able to follow directions and hold their breath (preferably as an outpatient) further evaluation with dedicated abdominal MRI could be considered for further characterization. Aortic Atherosclerosis (ICD10-I70.0). Electronically Signed   By:  Romona Curls M.D.   On: 02/17/2023 17:36        Scheduled Meds:  sodium chloride   Intravenous Once   allopurinol  100 mg Oral Daily   atorvastatin  80 mg Oral QHS   Chlorhexidine Gluconate Cloth  6 each Topical Q0600   insulin aspart  0-9 Units Subcutaneous Q4H   levETIRAcetam  500 mg Oral BID   lipase/protease/amylase  36,000 Units Oral TID WC   mupirocin ointment   Nasal BID   pantoprazole (PROTONIX) IV  40 mg Intravenous Q12H   sodium bicarbonate  650 mg Oral Daily   venlafaxine XR  150 mg Oral Daily   Continuous Infusions:  cefTRIAXone (ROCEPHIN)  IV 2 g (02/18/23 0316)   metronidazole Stopped (02/18/23 0807)     LOS: 1 day    Time spent: 35 minutes    Heather Nodal Hoover Brunette, DO [facility]  If 7PM-7AM, please contact night-coverage [email or web address] 02/18/2023, 9:20 AM

## 2023-02-18 NOTE — Progress Notes (Signed)
   02/18/23 1658  Spiritual Encounters  Type of Visit Initial  Care provided to: Patient  Conversation partners present during encounter Nurse  Referral source Patient request;Nurse (RN/NT/LPN)  Reason for visit Routine spiritual support  OnCall Visit No  Spiritual Framework  Presenting Themes Meaning/purpose/sources of inspiration;Impactful experiences and emotions;[name] hope and growth  Community/Connection Family;Friend(s);Faith community  Patient Stress Factors Health changes;Loss of control  Family Stress Factors None identified  Interventions  Spiritual Care Interventions Made Compassionate presence;Reflective listening;Narrative/life review;Prayer;Established relationship of care and support  Intervention Outcomes  Outcomes Connection to spiritual care;Awareness of support  Spiritual Care Plan  Spiritual Care Issues Still Outstanding Chaplain will continue to follow   Referred to bedside by ICU Nurse Renae Gloss today. Found patient laying in hospital bed with NT bedside obtaining Blood Sugar levels. Sat bedside and engaged her in reflection around her upbringing and relationships, illness story. She shared that she is aware how very sick she is and shared that she is afraid of the unknown. She held this writers hand throughout the visit and asked for prayer for strength. She asked for Chaplain to continue to follow and to come by another time to meet her husband. Chaplain remain available and follow in order to provide spiritual support and to assess for spiritual need.   Rev. Jolyn Lent, M.Div.  Chaplain

## 2023-02-18 NOTE — Consult Note (Cosign Needed Addendum)
Gastroenterology Consult   Referring Provider: Dr. Sherryll Burger  Primary Care Physician:  [name], MD Primary Gastroenterologist:  Dr. [name] Payor ([facility])  Patient ID: Heather [name]; [id number]; 02/21/1951   Admit date: 02/17/2023  LOS: 1 day   Date of Consultation: 02/18/2023  Reason for Consultation:  Acute blood loss anemia  History of Present Illness   Heather [name] is a 72 y.o. year old female with past medical history of small bowel Crohn's disease diagnosed in Jan 2022 and previously on Humira, history of pancreatitis possibly related to hypertriglyceridemia and use of Victoza, CKD, depression, diabetes, GERD, anxiety, IBS, hyperlipidemia, stroke, presenting to the ED yesterday with reports of dark red rectal bleeding per husband.   In the ED: Hgb 3.2, receiving 3 units PRBCs and improvement this morning to Hgb 9.5. WBC count initially 17.2, now 13.6. Sed rate greater than 130, CRP 2.3, creatinine 3.26 (previously 1.87 a week ago). BUN 115. CT abd/pelvis without contrast with mild bowel wall thickening and mucosal enhancement involving portions of the sigmoid colon and rectum could reflect proctocolitis. Moderate bilateral pleural effusions with associated atelectasis. Low-density pancreatic lesion.   Patient and husband are both very limited historians. Husband nor patient is sure if she is taking Humira. This is not on her drug list. Her last outpatient GI appt was in Jan 2022. Her husband reports dark red stool starting last Thursday and persisting until Sunday. Denies diarrhea. No bleeding since admission. No BM since admission. Patient takes Aleve at least 2-3 times per day for abdominal pain. She notes LUQ and epigastric pain. Unclear if patient is taking a PPI as outpatient or not.   Patient has one 20G in left upper arm that was placed via [place] in ED after multiple failed IV attempts. Additional access is being attempted at bedside via ultrasound.    EGD April 2021:  Normal   Colonoscopy May 2021: Examined portion of ileum normal.  Examined colon normal.  Colon biopsies negative.   Capsule endoscopy January 2022: Several small aphthous ulcers throughout the small bowel   Past Medical History:  Diagnosis Date   Acute renal failure superimposed on stage 3 chronic kidney disease 01/21/2016   Anemia    Anxiety    B12 deficiency    Brain tumor (benign) 10/29/2020   Cellulitis and abscess    Abdomen and buttocks   Chronic diastolic CHF (congestive heart failure) 03/16/2022   CKD (chronic kidney disease) stage 3, GFR 30-59 ml/min    Closed fracture of distal end of femur, unspecified fracture morphology, initial encounter    Coronary arteriosclerosis 07/15/2018   Crohn disease    Depression    Diabetic neuropathy    feet   DJD (degenerative joint disease)    Right forminal stenosis C4-5   Essential hypertension    Femur fracture, right 03/30/2017   Folliculitis    GERD (gastroesophageal reflux disease)    Gout    Headache(784.0)    Hiatal hernia    History of blood transfusion    History of bronchitis    History of cardiac catheterization    No significant CAD 2012   History of kidney stones    surgery to remove   Insomnia    Irritable bowel syndrome    Mixed hyperlipidemia    Nausea & vomiting 12/22/2014   Osteoarthritis    Palpitations    Pneumonia    Reflux esophagitis    Salmonella    Stroke    Tinnitus  Right   Type 2 diabetes mellitus     Past Surgical History:  Procedure Laterality Date   BACK SURGERY     c-spine surgery     03/2007   CARDIAC CATHETERIZATION     CHOLECYSTECTOMY     COLONOSCOPY  04/21/2011; 05/29/11   6/12: [place] - ?AVM at IC valve, inflammatory changes at IC valve [facility]); 7/12 - [facility]; IC valve erosions, look chronic and probably Crohn's per path   colonoscopy  2017   [place]: random biopsies normal. TI normal   ESOPHAGOGASTRODUODENOSCOPY  05/29/11   normal    ESOPHAGOGASTRODUODENOSCOPY N/A 01/21/2016   Dr. Bosie Clos: normal   EYE SURGERY Bilateral    cataracts removed   FEMUR IM NAIL Right 04/10/2018   Procedure: INTRAMEDULLARY (IM) NAIL FEMORAL;  Surgeon: Durene Romans, MD;  Location: [facility];  Service: Orthopedics;  Laterality: Right;   FRACTURE SURGERY     GIVENS CAPSULE STUDY  11/17/2020   HARDWARE REMOVAL Right 04/10/2018   Procedure: HARDWARE REMOVAL RIGHT DISTAL FEMUR;  Surgeon: Durene Romans, MD;  Location: [facility];  Service: Orthopedics;  Laterality: Right;   HERNIA REPAIR     IR FLUORO GUIDE CV LINE RIGHT  04/01/2017   IR US GUIDE VASC ACCESS RIGHT  04/01/2017   JOINT REPLACEMENT Right    hip   OPEN REDUCTION INTERNAL FIXATION (ORIF) DISTAL RADIAL FRACTURE Right 05/28/2019   Procedure: OPEN REDUCTION INTERNAL FIXATION (ORIF) DISTAL RADIAL FRACTURE;  Surgeon: Ernest Mallick, MD;  Location: [facility] OR;  Service: Orthopedics;  Laterality: Right;  with block 90 minutes   ORIF FEMUR FRACTURE Right 03/31/2017   Procedure: OPEN REDUCTION INTERNAL FIXATION (ORIF) DISTAL FEMUR FRACTURE;  Surgeon: Durene Romans, MD;  Location: [facility] OR;  Service: Orthopedics;  Laterality: Right;   ORIF FEMUR FRACTURE Right 12/14/2022   Procedure: OPEN REDUCTION INTERNAL FIXATION (ORIF) DISTAL FEMUR FRACTURE;  Surgeon: Roby Lofts, MD;  Location: [facility] OR;  Service: Orthopedics;  Laterality: Right;   removal of kidney stone     Right knee arthroscopic surgery     TONSILLECTOMY     TOTAL ABDOMINAL HYSTERECTOMY      Prior to Admission medications   Medication Sig Start Date End Date Taking? Authorizing Provider  allopurinol (ZYLOPRIM) 100 MG tablet Take 100 mg by mouth daily. 03/20/21  Yes [provider]  atorvastatin (LIPITOR) 80 MG tablet Take 1 tablet (80 mg total) by mouth daily. Restart on 06/29/22 Patient taking differently: Take 80 mg by mouth at bedtime. Restart on 06/29/22 06/22/22  Yes Tat, Onalee Hua, MD  gabapentin (NEURONTIN) 300 MG capsule Take 1 capsule by  mouth at bedtime.   Yes [provider]  HYDROcodone-acetaminophen (NORCO/VICODIN) 5-325 MG tablet Take 1 tablet by mouth every 6 (six) hours as needed.   Yes [provider]  insulin aspart (NOVOLOG) 100 UNIT/ML FlexPen Inject 2 Units into the skin 3 (three) times daily with meals. Give only if eats 50% or more of meal. Patient taking differently: Inject 2-5 Units into the skin 3 (three) times daily with meals. Give only if eats 50% or more of meal. 02/11/23  Yes Johnson, Clanford L, MD  insulin glargine-yfgn (SEMGLEE) 100 UNIT/ML Pen Inject 7 Units into the skin daily. Patient taking differently: Inject 15 Units into the skin daily. 02/11/23  Yes Johnson, Clanford L, MD  levETIRAcetam (KEPPRA) 500 MG tablet Take 500 mg by mouth 2 (two) times daily.   Yes [provider]  lipase/protease/amylase (CREON) 36000 UNITS CPEP  capsule Take 36,000 Units by mouth 3 (three) times daily with meals.   Yes [provider]  metoprolol succinate (TOPROL-XL) 25 MG 24 hr tablet Take 1.5 tablets (37.5 mg total) by mouth daily. 02/11/23  Yes Johnson, Clanford L, MD  naloxone (NARCAN) nasal spray 4 mg/0.1 mL Place 1 spray into the nose once.   Yes [provider]  naproxen sodium (ALEVE) 220 MG tablet Take 440 mg by mouth daily as needed (pain).   Yes [provider]  ondansetron (ZOFRAN-ODT) 4 MG disintegrating tablet Take 4 mg by mouth every 6 (six) hours as needed. 01/21/23  Yes [provider]  sodium bicarbonate 650 MG tablet Take 1 tablet (650 mg total) by mouth daily. 02/11/23  Yes Johnson, Clanford L, MD  venlafaxine XR (EFFEXOR-XR) 150 MG 24 hr capsule Take 150 mg by mouth daily. 07/11/22  Yes [provider]  esomeprazole (NEXIUM) 40 MG capsule Take 40 mg by mouth 2 (two) times daily before a meal. Patient not taking: Reported on 02/17/2023 03/04/12   [provider]    Current Facility-Administered Medications  Medication Dose Route  Frequency Provider Last Rate Last Admin   0.9 %  sodium chloride infusion (Manually program via Guardrails IV Fluids)   Intravenous Once Emokpae, Ejiroghene E, MD       acetaminophen (TYLENOL) tablet 650 mg  650 mg Oral Q6H PRN Emokpae, Ejiroghene E, MD   650 mg at 02/17/23 2301   Or   acetaminophen (TYLENOL) suppository 650 mg  650 mg Rectal Q6H PRN Emokpae, Ejiroghene E, MD       allopurinol (ZYLOPRIM) tablet 100 mg  100 mg Oral Daily Emokpae, Ejiroghene E, MD   100 mg at 02/18/23 0812   atorvastatin (LIPITOR) tablet 80 mg  80 mg Oral QHS Emokpae, Ejiroghene E, MD   80 mg at 02/17/23 2123   cefTRIAXone (ROCEPHIN) 2 g in sodium chloride 0.9 % 100 mL IVPB  2 g Intravenous Q24H Emokpae, Ejiroghene E, MD 200 mL/hr at 02/18/23 0316 2 g at 02/18/23 0316   Chlorhexidine Gluconate Cloth 2 % PADS 6 each  6 each Topical Q0600 Emokpae, Ejiroghene E, MD   6 each at 02/18/23 0531   fentaNYL (SUBLIMAZE) injection 12.5 mcg  12.5 mcg Intravenous Q2H PRN Sherryll Burger, Pratik D, DO   12.5 mcg at 02/18/23 0809   insulin aspart (novoLOG) injection 0-9 Units  0-9 Units Subcutaneous Q4H Emokpae, Ejiroghene E, MD   1 Units at 02/18/23 0603   labetalol (NORMODYNE) injection 5 mg  5 mg Intravenous Q2H PRN Emokpae, Ejiroghene E, MD       levETIRAcetam (KEPPRA) tablet 500 mg  500 mg Oral BID Emokpae, Ejiroghene E, MD   500 mg at 02/18/23 1610   lipase/protease/amylase (CREON) capsule 36,000 Units  36,000 Units Oral TID WC Emokpae, Ejiroghene E, MD       metroNIDAZOLE (FLAGYL) IVPB 500 mg  500 mg Intravenous Q12H Emokpae, Ejiroghene E, MD   Stopped at 02/18/23 0807   mupirocin ointment (BACTROBAN) 2 %   Nasal BID Sherryll Burger, Pratik D, DO       pantoprazole (PROTONIX) injection 40 mg  40 mg Intravenous Q12H Emokpae, Ejiroghene E, MD   40 mg at 02/18/23 0811   polyethylene glycol (MIRALAX / GLYCOLAX) packet 17 g  17 g Oral Daily PRN Emokpae, Ejiroghene E, MD       sodium bicarbonate tablet 650 mg  650 mg Oral Daily Emokpae, Ejiroghene E,  MD   650  mg at 02/18/23 4098   venlafaxine XR (EFFEXOR-XR) 24 hr capsule 150 mg  150 mg Oral Daily Emokpae, Ejiroghene E, MD   150 mg at 02/18/23 1191    Allergies as of 02/17/2023 - Review Complete 02/17/2023  Allergen Reaction Noted   Cozaar [losartan] Nausea And Vomiting, Swelling, and Rash 02/23/2015   Quinolones Itching, Nausea And Vomiting, Nausea Only, and Swelling 05/13/2012   Cymbalta [duloxetine hcl] Itching and Swelling 01/23/2016   Sulfa antibiotics Nausea And Vomiting and Swelling 05/13/2012   Synalar [fluocinolone] Swelling 01/23/2016   Cipro [ciprofloxacin hcl] Nausea And Vomiting and Swelling 03/16/2022   Diovan [valsartan] Nausea And Vomiting and Swelling 12/22/2014   Glucophage [metformin] Other (See Comments)    Ketalar [ketamine] Swelling 11/28/2021   Norco [hydrocodone-acetaminophen] Itching and Swelling 11/28/2021   Norvasc [amlodipine besylate] Itching and Swelling 11/28/2021   Nsaids Nausea And Vomiting and Other (See Comments) 08/29/2016   Tape Itching and Rash 06/15/2018   Zestril [lisinopril] Swelling and Rash     Family History  Problem Relation Age of Onset   Diabetes Mother    Colon cancer Brother        POSSIBLY. Patient states diagnosed at age 24, then later states age 79. unclear and limited historian   Esophageal cancer Neg Hx    Rectal cancer Neg Hx    Stomach cancer Neg Hx     Social History   Socioeconomic History   Marital status: Married    Spouse name: Not on file   Number of children: 2   Years of education: Not on file   Highest education level: Not on file  Occupational History   Occupation: Disabled  Tobacco Use   Smoking status: Never   Smokeless tobacco: Never  Vaping Use   Vaping Use: Never used  Substance and Sexual Activity   Alcohol use: No   Drug use: No   Sexual activity: Not on file    Comment: Hysterectomy  Other Topics Concern   Not on file  Social History Narrative   Patient is married 2 children she is  disabled, 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    Social Determinants of Health   Financial Resource Strain: Not on file  Food Insecurity: No Food Insecurity (02/17/2023)   Hunger Vital Sign    Worried About Running Out of Food in the Last Year: Never true    Ran Out of Food in the Last Year: Never true  Transportation Needs: No Transportation Needs (02/17/2023)   PRAPARE - Administrator, Civil Service (Medical): No    Lack of Transportation (Non-Medical): No  Physical Activity: Not on file  Stress: Not on file  Social Connections: Not on file  Intimate Partner Violence: Not At Risk (02/17/2023)   Humiliation, Afraid, Rape, and Kick questionnaire    Fear of Current or Ex-Partner: No    Emotionally Abused: No    Physically Abused: No    Sexually Abused: No     Review of Systems   Limited due to poor historian  Physical Exam   Vital Signs in last 24 hours: Temp:  [96.2 F (35.7 C)-97.8 F (36.6 C)] 97.5 F (36.4 C) (04/22 0307) Pulse Rate:  [65-80] 74 (04/22 0600) Resp:  [  10-24] 18 (04/22 0600) BP: (86-177)/(33-97) 128/33 (04/22 0008) SpO2:  [96 %-100 %] 100 % (04/22 0600) Arterial Line BP: (125-186)/(32-61) 146/48 (04/22 0809) FiO2 (%):  [28 %] 28 % (04/21 2029) Weight:  [50 kg-55 kg] 55 kg (04/22 0500) Last BM Date : 02/17/23  General:   Alert,  chronically ill-appearing Head:  Normocephalic and atraumatic. Eyes:  Sclera clear, no icterus.    Ears:  Normal auditory acuity. Lungs:  Clear throughout to auscultation.    Heart:  S1 S2 present without murmurs Abdomen:  Soft, obese, and nondistended. TTP epigastric and LUQ, moreso epigastric tenderness. No masses, hepatosplenomegaly or hernias noted. Normal bowel sounds, without guarding, and without rebound.   Rectal: deferred    Msk:  thin lower extremities bilaterally Extremities:  Without  edema. Neurologic:  Alert and  oriented to person only Skin:  Intact without significant lesions or rashes. Psych:  Alert and cooperative. Normal mood and affect.  Intake/Output from previous day: 04/21 0701 - 04/22 0700 In: 1304 [Blood:1106; IV Piggyback:198] Out: 400 [Urine:400] Intake/Output this shift: Total I/O In: 0.5 [IV Piggyback:0.5] Out: -     Labs/Studies   Recent Labs Recent Labs    02/17/23 1625 02/17/23 1735 02/18/23 0634  WBC 17.2* 16.6* 13.6*  HGB 3.2* 3.8* 9.5*  HCT 10.3* 12.6* 27.6*  PLT 348 367 249   BMET Recent Labs    02/17/23 1604 02/18/23 0634  NA 135 135  K 4.6 4.4  CL 100 102  CO2 21* 21*  GLUCOSE 146* 132*  BUN 115* 115*  CREATININE 3.26* 3.13*  CALCIUM 7.5* 7.4*   LFT Recent Labs    02/17/23 1604  PROT 4.3*  ALBUMIN 1.6*  AST 25  ALT 11  ALKPHOS 94  BILITOT 0.6     Radiology/Studies CT ABDOMEN PELVIS WO CONTRAST  Result Date: 02/17/2023 CLINICAL DATA:  Concern for bowel obstruction. Patient reportedly has red stool. EXAM: CT ABDOMEN AND PELVIS WITHOUT CONTRAST TECHNIQUE: Multidetector CT imaging of the abdomen and pelvis was performed following the standard protocol without IV contrast. RADIATION DOSE REDUCTION: This exam was performed according to the departmental dose-optimization program which includes automated exposure control, adjustment of the mA and/or kV according to patient size and/or use of iterative reconstruction technique. COMPARISON:  CT abdomen pelvis dated 02/04/2023. FINDINGS: Lower chest: Moderate bilateral pleural effusions with associated atelectasis are partially imaged. The heart is mildly enlarged. The blood pool is hypoattenuating, suggestive of anemia. Hepatobiliary: No focal liver abnormality is seen. Status post cholecystectomy. No biliary dilatation. Pancreas: Atrophic. A 1.8 cm low-density lesion in the pancreatic tail appears  unchanged. No pancreatic ductal dilatation or surrounding inflammatory changes. Spleen: Normal in size without focal abnormality. Adrenals/Urinary Tract: Adrenal glands are unremarkable. Bilateral punctate calcifications in both kidneys may represent nonobstructing calculi measuring up to 2 mm and/or vascular calcifications. No suspicious focal lesion or hydronephrosis on either side. Bladder is unremarkable. Stomach/Bowel: Stomach is within normal limits. The appendix is not definitely identified, however no pericecal inflammatory changes are noted to suggest acute appendicitis. There is a mild bowel wall thickening and mucosal enhancement involving portions of the sigmoid colon and rectum. No significant surrounding inflammatory changes. No bowel obstruction. Vascular/Lymphatic: Aortic atherosclerosis. No enlarged abdominal or pelvic lymph nodes. Reproductive: Status post hysterectomy. No adnexal masses. Other: No abdominal wall hernia or abnormality. No abdominopelvic ascites. Anasarca is noted Musculoskeletal: There has been slight progression since 02/04/2023 of a T12 compression deformity with approximately 50% height loss centrally and 3 mm  retropulsion into the central canal. Fixation hardware is seen in the right femur. Fixation hardware in the lumbar spine from L1-L4. IMPRESSION: 1. Mild bowel wall thickening and mucosal enhancement involving portions of the sigmoid colon and rectum may reflect proctocolitis. No bowel obstruction. 2. Moderate bilateral pleural effusions with associated atelectasis. 3. Slight progression since 02/04/2023 of a T12 compression deformity with approximately 50% height loss centrally and 3 mm retropulsion into the central canal. 4. Low-density pancreatic lesion. When the patient is clinically stable and able to follow directions and hold their breath (preferably as an outpatient) further evaluation with dedicated abdominal MRI could be considered for further characterization.  Aortic Atherosclerosis (ICD10-I70.0). Electronically Signed   By: Romona Curls M.D.   On: 02/17/2023 17:36     Assessment    72 y.o. year old female with past medical history of small bowel Crohn's disease diagnosed in Jan 2022 and previously on Humira, history of pancreatitis possibly related to hypertriglyceridemia and use of Victoza, CKD, depression, diabetes, GERD, anxiety, IBS, hyperlipidemia, stroke, presenting to the ED yesterday with reports of dark red rectal bleeding per husband and found to have profound anemia with Hgb 3.2.Atmitted with acute symptomatic blood loss anemia in setting of GI bleed, proctocolitis, acute renal failure on CKD, acute metabolic encephalopathy but improved following blood transfusion.   Acute blood loss anemia: Hgb 3.2 on presentation, previously 8.2 a week ago during prior admission. Reports of dark red stool per husband for several days prior to admission. Very limited historian, both patient and husband. No overt bleeding since admission; no bowel movement since admission. Hgb today is 9.5 after receiving 3 units PRBCs. She is hemodynamically stable. Anemia multifactorial in setting of persistent NSAID use, +/- Crohn's flare.    Crohn's disease: capsule Jan 2022 with several small aphthous ulcer throughout small bowel. EGD and Colonoscopy normal in 2021. Normal examination of TI and negative colonic biopsies. CT without contrast yesterday with mild bowel wall thickening and mucosal enhancement involving portions of sigmoid and rectum, possible proctocolitis. Again, no further bleeding since admission and no diarrhea reported. It does not appear that she has been on Humira, which was initially prescribed in 2022. Historically, steroid therapy has been judicious in light of diabetes. Timing of colonoscopy to be determined.   Limited IV access: 20 gauge in left upper arm placed yesterday by ED physician via [place] after multiple failed IV attempts. Nursing staff  attempting second IV with US guidance for additional access.   Pancreatic lesion: on imaging. Recommend outpatient MRI.      Plan / Recommendations    Follow H/H PPI IV BID Clear liquids Discussion of endoscopic procedures and with Dr. Marletta Lor Attempting additional IV access at bedside Absolute avoidance of NSAIDs going forward Stool studies if diarrhea Needs close outpatient follow-up with primary GI regarding Crohn's management     02/18/2023, 8:58 AM  Gelene Mink, PhD, ANP-BC [facility]

## 2023-02-18 NOTE — TOC Progression Note (Signed)
Transition of Care [facility]) - Progression Note    Patient Details  Name: Heather [name] MRN: [id number] Date of Birth: Mar 06, 1951  [facility] CM/SW Contact  Leitha Bleak, RN Phone Number: 02/18/2023, 2:43 PM  Clinical Narrative:   Patient recently assessed and discharged. Had SNF and Home health recent past. Live at home with her spouse. Very ill, getting 3 units of blood today. TOC following. Will order PT when stable to assess.     Barriers to Discharge: Continued Medical Work up  Expected Discharge Plan and Services       Living arrangements for the past 2 months: Single Family Home                    Social Determinants of Health (SDOH) Interventions SDOH Screenings   Food Insecurity: No Food Insecurity (02/17/2023)  Housing: Low Risk  (02/17/2023)  Transportation Needs: No Transportation Needs (02/17/2023)  Utilities: Not At Risk (02/17/2023)  Tobacco Use: Low Risk  (02/17/2023)    Readmission Risk Interventions    02/06/2023    8:28 AM 11/14/2022   11:31 AM 11/12/2022    4:06 PM  Readmission Risk Prevention Plan  Transportation Screening Complete  Complete  Medication Review (RN Care Manager) Complete  Complete  PCP or Specialist appointment within 3-5 days of discharge  Complete Not Complete  HRI or Home Care Consult Complete  Complete  SW Recovery Care/Counseling Consult Complete  Complete  Palliative Care Screening Not Applicable  Not Applicable  Skilled Nursing Facility Not Applicable  Complete

## 2023-02-18 NOTE — Inpatient Diabetes Management (Signed)
Inpatient Diabetes Program Recommendations  AACE/ADA: New Consensus Statement on Inpatient Glycemic Control (2015)  Target Ranges:  Prepandial:   less than 140 mg/dL      Peak postprandial:   less than 180 mg/dL (1-2 hours)      Critically ill patients:  140 - 180 mg/dL   Lab Results  Component Value Date   GLUCAP 86 02/18/2023   HGBA1C 6.4 (H) 02/18/2023    Review of Glycemic Control  Latest Reference Range & Units 02/18/23 07:42 02/18/23 11:20 02/18/23 11:56  Glucose-Capillary 70 - 99 mg/dL 161 (H) 66 (L) 86  (H): Data is abnormally high (L): Data is abnormally low Diabetes history: Type 2 DM Outpatient Diabetes medications: Novolog 2 units TID, Semglee 15 units QD Current orders for Inpatient glycemic control: Novolog 0-9 units Q4H  Inpatient Diabetes Program Recommendations:    Consider reducing correction to Novolog 0-6 units Q4H given hypoglycemic episode this AM.   Thanks, Lujean Rave, MSN, RNC-OB Diabetes Coordinator [phone] (8a-5p)

## 2023-02-18 NOTE — Progress Notes (Addendum)
Initial Nutrition Assessment  DOCUMENTATION CODES:   Non-severe (moderate) malnutrition in context of chronic illness  INTERVENTION:  Start ProSource Plus 30 ml TID (each 30 ml provides 100 kcal, 15 gr protein)   Malnutrition education attached to AVS  NUTRITION DIAGNOSIS:   Moderate Malnutrition related to chronic illness as evidenced by mild fat depletion, mild muscle depletion, moderate muscle depletion.   GOAL:  Patient will meet greater than or equal to 90% of their needs   MONITOR:  Diet advancement, Supplement acceptance, Weight trends, I & O's, Labs  REASON FOR ASSESSMENT:   Malnutrition Screening Tool    ASSESSMENT: Patient is a 72 yo female from home with hx of DM2, Chron's, CKD-4, CHF, HTN who presents with bloody stools. Patient required 3 units of blood (hgb 3.2) in the ED.   Husband is bedside. Patient last ate yesterday (1 peanut butter cracker and small piece of cake). Able to feed herself. Edema to right arm noted. Patient says, it's related to a previous fracture. Patient doesn't like Ensure or yogurt but like milk- include with tray when diet is advanced to full liquids. She is asking for jello which will be served with her dinner tonight.    She has not been walking the past 3 months.   Patient reports usual body weight 120 lb. Currently 55 kg (121 lb).   Medications reviewed and include: insulin, keppra, Creon (36,000 units with meals).    Intake/Output Summary (Last 24 hours) at 02/18/2023 1615 Last data filed at 02/18/2023 0981 Gross per 24 hour  Intake 1304.49 ml  Output 400 ml  Net 904.49 ml        Latest Ref Rng & Units 02/18/2023    6:34 AM 02/17/2023    4:04 PM 02/10/2023    4:13 AM  BMP  Glucose 70 - 99 mg/dL 191  478  295   BUN 8 - 23 mg/dL 621  308  74   Creatinine 0.44 - 1.00 mg/dL 6.57  8.46  9.62   Sodium 135 - 145 mmol/L 135  135  133   Potassium 3.5 - 5.1 mmol/L 4.4  4.6  3.6   Chloride 98 - 111 mmol/L 102  100  100   CO2 22 -  32 mmol/L Calcium 8.9 - 10.3 mg/dL 7.4  7.5  7.8       NUTRITION - FOCUSED PHYSICAL EXAM:  Flowsheet Row Most Recent Value  Orbital Region Mild depletion  Upper Arm Region No depletion  Thoracic and Lumbar Region No depletion  Buccal Region Mild depletion  Temple Region Mild depletion  Clavicle Bone Region Moderate depletion  Clavicle and Acromion Bone Region Moderate depletion  Scapular Bone Region Mild depletion  Dorsal Hand No depletion  Patellar Region Moderate depletion  Anterior Thigh Region Moderate depletion  Posterior Calf Region Unable to assess  Edema (RD Assessment) None  Hair Reviewed  Eyes Reviewed  Mouth Reviewed  Skin Reviewed  Nails Reviewed       Diet Order:   Diet Order             Diet clear liquid Fluid consistency: Thin  Diet effective now                   EDUCATION NEEDS:  Education needs have been addressed  Skin:  Skin Assessment: Reviewed RN Assessment  Last BM:  4/21 small type 6 per rectum  Height:   Ht Readings from Last  1 Encounters:  02/17/23  (1.626 m)    Weight:   Wt Readings from Last 1 Encounters:  02/18/23 55 kg    Ideal Body Weight:   55 kg  BMI:  Body mass index is 20.81 kg/m.  Estimated Nutritional Needs:   Kcal:  1600-1800  Protein:  82-90 gr  Fluid:  >1500 ml daily  Royann Shivers MS,RD,CSG,LDN Contact: Loretha Stapler

## 2023-02-18 NOTE — Progress Notes (Signed)
Date and time results received: 02/18/23 0746 (use smartphrase ".now" to insert current time)  Test: MRSA of Nares  Critical Value: Positive  Name of Provider Notified: Dr Sherryll Burger and Morrie Sheldon RN (patient nurse)  Orders Received? Or Actions Taken?:  Standing orders placed for Bactroban ointment BID

## 2023-02-18 NOTE — H&P (View-Only) (Signed)
 Gastroenterology Consult   Referring Provider: Dr. Shah  Primary Care Physician:  Shah, Ashish, MD Primary Gastroenterologist:  Dr. Gessner ([facility])  Patient ID: Heather [name]; [id number]; 08/15/1951   Admit date: 02/17/2023  LOS: 1 day   Date of Consultation: 02/18/2023  Reason for Consultation:  Acute blood loss anemia  History of Present Illness   Heather [name] is a 71 y.o. year old female with past medical history of small bowel Crohn's disease diagnosed in Jan 2022 and previously on Humira, history of pancreatitis possibly related to hypertriglyceridemia and use of Victoza, CKD, depression, diabetes, GERD, anxiety, IBS, hyperlipidemia, stroke, presenting to the ED yesterday with reports of dark red rectal bleeding per husband.   In the ED: Hgb 3.2, receiving 3 units PRBCs and improvement this morning to Hgb 9.5. WBC count initially 17.2, now 13.6. Sed rate greater than 130, CRP 2.3, creatinine 3.26 (previously 1.87 a week ago). BUN 115. CT abd/pelvis without contrast with mild bowel wall thickening and mucosal enhancement involving portions of the sigmoid colon and rectum could reflect proctocolitis. Moderate bilateral pleural effusions with associated atelectasis. Low-density pancreatic lesion.   Patient and husband are both very limited historians. Husband nor patient is sure if she is taking Humira. This is not on her drug list. Her last outpatient GI appt was in Jan 2022. Her husband reports dark red stool starting last Thursday and persisting until Sunday. Denies diarrhea. No bleeding since admission. No BM since admission. Patient takes Aleve at least 2-3 times per day for abdominal pain. She notes LUQ and epigastric pain. Unclear if patient is taking a PPI as outpatient or not.   Patient has one 20G in left upper arm that was placed via US in ED after multiple failed IV attempts. Additional access is being attempted at bedside via ultrasound.    EGD April 2021:  Normal   Colonoscopy May 2021: Examined portion of ileum normal.  Examined colon normal.  Colon biopsies negative.   Capsule endoscopy January 2022: Several small aphthous ulcers throughout the small bowel   Past Medical History:  Diagnosis Date   Acute renal failure superimposed on stage 3 chronic kidney disease 01/21/2016   Anemia    Anxiety    B12 deficiency    Brain tumor (benign) 10/29/2020   Cellulitis and abscess    Abdomen and buttocks   Chronic diastolic CHF (congestive heart failure) 03/16/2022   CKD (chronic kidney disease) stage 3, GFR 30-59 ml/min    Closed fracture of distal end of femur, unspecified fracture morphology, initial encounter    Coronary arteriosclerosis 07/15/2018   Crohn disease    Depression    Diabetic neuropathy    feet   DJD (degenerative joint disease)    Right forminal stenosis C4-5   Essential hypertension    Femur fracture, right 03/30/2017   Folliculitis    GERD (gastroesophageal reflux disease)    Gout    Headache(784.0)    Hiatal hernia    History of blood transfusion    History of bronchitis    History of cardiac catheterization    No significant CAD 2012   History of kidney stones    surgery to remove   Insomnia    Irritable bowel syndrome    Mixed hyperlipidemia    Nausea & vomiting 12/22/2014   Osteoarthritis    Palpitations    Pneumonia    Reflux esophagitis    Salmonella    Stroke    Tinnitus      Right   Type 2 diabetes mellitus     Past Surgical History:  Procedure Laterality Date   BACK SURGERY     c-spine surgery     03/2007   CARDIAC CATHETERIZATION     CHOLECYSTECTOMY     COLONOSCOPY  04/21/2011; 05/29/11   6/12: [facility] - ?AVM at IC valve, inflammatory changes at IC valve ([facility]); 7/12 - [facility]; IC valve erosions, look chronic and probably Crohn's per path   colonoscopy  2017   [place]: random biopsies normal. TI normal   ESOPHAGOGASTRODUODENOSCOPY  05/29/11   normal    ESOPHAGOGASTRODUODENOSCOPY N/A 01/21/2016   Dr. Schooler: normal   EYE SURGERY Bilateral    cataracts removed   FEMUR IM NAIL Right 04/10/2018   Procedure: INTRAMEDULLARY (IM) NAIL FEMORAL;  Surgeon: Olin, Matthew, MD;  Location: [facility];  Service: Orthopedics;  Laterality: Right;   FRACTURE SURGERY     GIVENS CAPSULE STUDY  11/17/2020   HARDWARE REMOVAL Right 04/10/2018   Procedure: HARDWARE REMOVAL RIGHT DISTAL FEMUR;  Surgeon: Olin, Matthew, MD;  Location: [facility];  Service: Orthopedics;  Laterality: Right;   HERNIA REPAIR     IR FLUORO GUIDE CV LINE RIGHT  04/01/2017   IR US GUIDE VASC ACCESS RIGHT  04/01/2017   JOINT REPLACEMENT Right    hip   OPEN REDUCTION INTERNAL FIXATION (ORIF) DISTAL RADIAL FRACTURE Right 05/28/2019   Procedure: OPEN REDUCTION INTERNAL FIXATION (ORIF) DISTAL RADIAL FRACTURE;  Surgeon: Creighton, James J III, MD;  Location: [facility];  Service: Orthopedics;  Laterality: Right;  with block 90 minutes   ORIF FEMUR FRACTURE Right 03/31/2017   Procedure: OPEN REDUCTION INTERNAL FIXATION (ORIF) DISTAL FEMUR FRACTURE;  Surgeon: Olin, Matthew, MD;  Location: [facility];  Service: Orthopedics;  Laterality: Right;   ORIF FEMUR FRACTURE Right 12/14/2022   Procedure: OPEN REDUCTION INTERNAL FIXATION (ORIF) DISTAL FEMUR FRACTURE;  Surgeon: Haddix, Kevin P, MD;  Location: [facility];  Service: Orthopedics;  Laterality: Right;   removal of kidney stone     Right knee arthroscopic surgery     TONSILLECTOMY     TOTAL ABDOMINAL HYSTERECTOMY      Prior to Admission medications   Medication Sig Start Date End Date Taking? Authorizing Provider  allopurinol (ZYLOPRIM) 100 MG tablet Take 100 mg by mouth daily. 03/20/21  Yes [provider]  atorvastatin (LIPITOR) 80 MG tablet Take 1 tablet (80 mg total) by mouth daily. Restart on 06/29/22 Patient taking differently: Take 80 mg by mouth at bedtime. Restart on 06/29/22 06/22/22  Yes Tat, David, MD  gabapentin (NEURONTIN) 300 MG capsule Take 1 capsule by  mouth at bedtime.   Yes [provider]  HYDROcodone-acetaminophen (NORCO/VICODIN) 5-325 MG tablet Take 1 tablet by mouth every 6 (six) hours as needed.   Yes [provider]  insulin aspart (NOVOLOG) 100 UNIT/ML FlexPen Inject 2 Units into the skin 3 (three) times daily with meals. Give only if eats 50% or more of meal. Patient taking differently: Inject 2-5 Units into the skin 3 (three) times daily with meals. Give only if eats 50% or more of meal. 02/11/23  Yes Johnson, Clanford L, MD  insulin glargine-yfgn (SEMGLEE) 100 UNIT/ML Pen Inject 7 Units into the skin daily. Patient taking differently: Inject 15 Units into the skin daily. 02/11/23  Yes Johnson, Clanford L, MD  levETIRAcetam (KEPPRA) 500 MG tablet Take 500 mg by mouth 2 (two) times daily.   Yes [provider]  lipase/protease/amylase (CREON) 36000 UNITS CPEP   capsule Take 36,000 Units by mouth 3 (three) times daily with meals.   Yes [provider]  metoprolol succinate (TOPROL-XL) 25 MG 24 hr tablet Take 1.5 tablets (37.5 mg total) by mouth daily. 02/11/23  Yes Johnson, Clanford L, MD  naloxone (NARCAN) nasal spray 4 mg/0.1 mL Place 1 spray into the nose once.   Yes [provider]  naproxen sodium (ALEVE) 220 MG tablet Take 440 mg by mouth daily as needed (pain).   Yes [provider]  ondansetron (ZOFRAN-ODT) 4 MG disintegrating tablet Take 4 mg by mouth every 6 (six) hours as needed. 01/21/23  Yes [provider]  sodium bicarbonate 650 MG tablet Take 1 tablet (650 mg total) by mouth daily. 02/11/23  Yes Johnson, Clanford L, MD  venlafaxine XR (EFFEXOR-XR) 150 MG 24 hr capsule Take 150 mg by mouth daily. 07/11/22  Yes [provider]  esomeprazole (NEXIUM) 40 MG capsule Take 40 mg by mouth 2 (two) times daily before a meal. Patient not taking: Reported on 02/17/2023 03/04/12   [provider]    Current Facility-Administered Medications  Medication Dose Route  Frequency Provider Last Rate Last Admin   0.9 %  sodium chloride infusion (Manually program via Guardrails IV Fluids)   Intravenous Once Emokpae, Ejiroghene E, MD       acetaminophen (TYLENOL) tablet 650 mg  650 mg Oral Q6H PRN Emokpae, Ejiroghene E, MD   650 mg at 02/17/23 2301   Or   acetaminophen (TYLENOL) suppository 650 mg  650 mg Rectal Q6H PRN Emokpae, Ejiroghene E, MD       allopurinol (ZYLOPRIM) tablet 100 mg  100 mg Oral Daily Emokpae, Ejiroghene E, MD   100 mg at 02/18/23 0812   atorvastatin (LIPITOR) tablet 80 mg  80 mg Oral QHS Emokpae, Ejiroghene E, MD   80 mg at 02/17/23 2123   cefTRIAXone (ROCEPHIN) 2 g in sodium chloride 0.9 % 100 mL IVPB  2 g Intravenous Q24H Emokpae, Ejiroghene E, MD 200 mL/hr at 02/18/23 0316 2 g at 02/18/23 0316   Chlorhexidine Gluconate Cloth 2 % PADS 6 each  6 each Topical Q0600 Emokpae, Ejiroghene E, MD   6 each at 02/18/23 0531   fentaNYL (SUBLIMAZE) injection 12.5 mcg  12.5 mcg Intravenous Q2H PRN Shah, Pratik D, DO   12.5 mcg at 02/18/23 0809   insulin aspart (novoLOG) injection 0-9 Units  0-9 Units Subcutaneous Q4H Emokpae, Ejiroghene E, MD   1 Units at 02/18/23 0603   labetalol (NORMODYNE) injection 5 mg  5 mg Intravenous Q2H PRN Emokpae, Ejiroghene E, MD       levETIRAcetam (KEPPRA) tablet 500 mg  500 mg Oral BID Emokpae, Ejiroghene E, MD   500 mg at 02/18/23 0812   lipase/protease/amylase (CREON) capsule 36,000 Units  36,000 Units Oral TID WC Emokpae, Ejiroghene E, MD       metroNIDAZOLE (FLAGYL) IVPB 500 mg  500 mg Intravenous Q12H Emokpae, Ejiroghene E, MD   Stopped at 02/18/23 0807   mupirocin ointment (BACTROBAN) 2 %   Nasal BID Shah, Pratik D, DO       pantoprazole (PROTONIX) injection 40 mg  40 mg Intravenous Q12H Emokpae, Ejiroghene E, MD   40 mg at 02/18/23 0811   polyethylene glycol (MIRALAX / GLYCOLAX) packet 17 g  17 g Oral Daily PRN Emokpae, Ejiroghene E, MD       sodium bicarbonate tablet 650 mg  650 mg Oral Daily Emokpae, Ejiroghene E,  MD   650   mg at 02/18/23 0812   venlafaxine XR (EFFEXOR-XR) 24 hr capsule 150 mg  150 mg Oral Daily Emokpae, Ejiroghene E, MD   150 mg at 02/18/23 0811    Allergies as of 02/17/2023 - Review Complete 02/17/2023  Allergen Reaction Noted   Cozaar [losartan] Nausea And Vomiting, Swelling, and Rash 02/23/2015   Quinolones Itching, Nausea And Vomiting, Nausea Only, and Swelling 05/13/2012   Cymbalta [duloxetine hcl] Itching and Swelling 01/23/2016   Sulfa antibiotics Nausea And Vomiting and Swelling 05/13/2012   Synalar [fluocinolone] Swelling 01/23/2016   Cipro [ciprofloxacin hcl] Nausea And Vomiting and Swelling 03/16/2022   Diovan [valsartan] Nausea And Vomiting and Swelling 12/22/2014   Glucophage [metformin] Other (See Comments)    Ketalar [ketamine] Swelling 11/28/2021   Norco [hydrocodone-acetaminophen] Itching and Swelling 11/28/2021   Norvasc [amlodipine besylate] Itching and Swelling 11/28/2021   Nsaids Nausea And Vomiting and Other (See Comments) 08/29/2016   Tape Itching and Rash 06/15/2018   Zestril [lisinopril] Swelling and Rash     Family History  Problem Relation Age of Onset   Diabetes Mother    Colon cancer Brother        POSSIBLY. Patient states diagnosed at age 51, then later states age 89. unclear and limited historian   Esophageal cancer Neg Hx    Rectal cancer Neg Hx    Stomach cancer Neg Hx     Social History   Socioeconomic History   Marital status: Married    Spouse name: Not on file   Number of children: 2   Years of education: Not on file   Highest education level: Not on file  Occupational History   Occupation: Disabled  Tobacco Use   Smoking status: Never   Smokeless tobacco: Never  Vaping Use   Vaping Use: Never used  Substance and Sexual Activity   Alcohol use: No   Drug use: No   Sexual activity: Not on file    Comment: Hysterectomy  Other Topics Concern   Not on file  Social History Narrative   Patient is married 2 children she is  disabled, 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    Social Determinants of Health   Financial Resource Strain: Not on file  Food Insecurity: No Food Insecurity (02/17/2023)   Hunger Vital Sign    Worried About Running Out of Food in the Last Year: Never true    Ran Out of Food in the Last Year: Never true  Transportation Needs: No Transportation Needs (02/17/2023)   PRAPARE - Transportation    Lack of Transportation (Medical): No    Lack of Transportation (Non-Medical): No  Physical Activity: Not on file  Stress: Not on file  Social Connections: Not on file  Intimate Partner Violence: Not At Risk (02/17/2023)   Humiliation, Afraid, Rape, and Kick questionnaire    Fear of Current or Ex-Partner: No    Emotionally Abused: No    Physically Abused: No    Sexually Abused: No     Review of Systems   Limited due to poor historian  Physical Exam   Vital Signs in last 24 hours: Temp:  [96.2 F (35.7 C)-97.8 F (36.6 C)] 97.5 F (36.4 C) (04/22 0307) Pulse Rate:  [65-80] 74 (04/22 0600) Resp:  [  10-24] 18 (04/22 0600) BP: (86-177)/(33-97) 128/33 (04/22 0008) SpO2:  [96 %-100 %] 100 % (04/22 0600) Arterial Line BP: (125-186)/(32-61) 146/48 (04/22 0809) FiO2 (%):  [28 %] 28 % (04/21 2029) Weight:  [50 kg-55 kg] 55 kg (04/22 0500) Last BM Date : 02/17/23  General:   Alert,  chronically ill-appearing Head:  Normocephalic and atraumatic. Eyes:  Sclera clear, no icterus.    Ears:  Normal auditory acuity. Lungs:  Clear throughout to auscultation.    Heart:  S1 S2 present without murmurs Abdomen:  Soft, obese, and nondistended. TTP epigastric and LUQ, moreso epigastric tenderness. No masses, hepatosplenomegaly or hernias noted. Normal bowel sounds, without guarding, and without rebound.   Rectal: deferred    Msk:  thin lower extremities bilaterally Extremities:  Without  edema. Neurologic:  Alert and  oriented to person only Skin:  Intact without significant lesions or rashes. Psych:  Alert and cooperative. Normal mood and affect.  Intake/Output from previous day: 04/21 0701 - 04/22 0700 In: 1304 [Blood:1106; IV Piggyback:198] Out: 400 [Urine:400] Intake/Output this shift: Total I/O In: 0.5 [IV Piggyback:0.5] Out: -     Labs/Studies   Recent Labs Recent Labs    02/17/23 1625 02/17/23 1735 02/18/23 0634  WBC 17.2* 16.6* 13.6*  HGB 3.2* 3.8* 9.5*  HCT 10.3* 12.6* 27.6*  PLT 348 367 249   BMET Recent Labs    02/17/23 1604 02/18/23 0634  NA 135 135  K 4.6 4.4  CL 100 102  CO2 21* 21*  GLUCOSE 146* 132*  BUN 115* 115*  CREATININE 3.26* 3.13*  CALCIUM 7.5* 7.4*   LFT Recent Labs    02/17/23 1604  PROT 4.3*  ALBUMIN 1.6*  AST 25  ALT 11  ALKPHOS 94  BILITOT 0.6     Radiology/Studies CT ABDOMEN PELVIS WO CONTRAST  Result Date: 02/17/2023 CLINICAL DATA:  Concern for bowel obstruction. Patient reportedly has red stool. EXAM: CT ABDOMEN AND PELVIS WITHOUT CONTRAST TECHNIQUE: Multidetector CT imaging of the abdomen and pelvis was performed following the standard protocol without IV contrast. RADIATION DOSE REDUCTION: This exam was performed according to the departmental dose-optimization program which includes automated exposure control, adjustment of the mA and/or kV according to patient size and/or use of iterative reconstruction technique. COMPARISON:  CT abdomen pelvis dated 02/04/2023. FINDINGS: Lower chest: Moderate bilateral pleural effusions with associated atelectasis are partially imaged. The heart is mildly enlarged. The blood pool is hypoattenuating, suggestive of anemia. Hepatobiliary: No focal liver abnormality is seen. Status post cholecystectomy. No biliary dilatation. Pancreas: Atrophic. A 1.8 cm low-density lesion in the pancreatic tail appears  unchanged. No pancreatic ductal dilatation or surrounding inflammatory changes. Spleen: Normal in size without focal abnormality. Adrenals/Urinary Tract: Adrenal glands are unremarkable. Bilateral punctate calcifications in both kidneys may represent nonobstructing calculi measuring up to 2 mm and/or vascular calcifications. No suspicious focal lesion or hydronephrosis on either side. Bladder is unremarkable. Stomach/Bowel: Stomach is within normal limits. The appendix is not definitely identified, however no pericecal inflammatory changes are noted to suggest acute appendicitis. There is a mild bowel wall thickening and mucosal enhancement involving portions of the sigmoid colon and rectum. No significant surrounding inflammatory changes. No bowel obstruction. Vascular/Lymphatic: Aortic atherosclerosis. No enlarged abdominal or pelvic lymph nodes. Reproductive: Status post hysterectomy. No adnexal masses. Other: No abdominal wall hernia or abnormality. No abdominopelvic ascites. Anasarca is noted Musculoskeletal: There has been slight progression since 02/04/2023 of a T12 compression deformity with approximately 50% height loss centrally and 3 mm   retropulsion into the central canal. Fixation hardware is seen in the right femur. Fixation hardware in the lumbar spine from L1-L4. IMPRESSION: 1. Mild bowel wall thickening and mucosal enhancement involving portions of the sigmoid colon and rectum may reflect proctocolitis. No bowel obstruction. 2. Moderate bilateral pleural effusions with associated atelectasis. 3. Slight progression since 02/04/2023 of a T12 compression deformity with approximately 50% height loss centrally and 3 mm retropulsion into the central canal. 4. Low-density pancreatic lesion. When the patient is clinically stable and able to follow directions and hold their breath (preferably as an outpatient) further evaluation with dedicated abdominal MRI could be considered for further characterization.  Aortic Atherosclerosis (ICD10-I70.0). Electronically Signed   By: Tyler  Litton M.D.   On: 02/17/2023 17:36     Assessment    71 y.o. year old female with past medical history of small bowel Crohn's disease diagnosed in Jan 2022 and previously on Humira, history of pancreatitis possibly related to hypertriglyceridemia and use of Victoza, CKD, depression, diabetes, GERD, anxiety, IBS, hyperlipidemia, stroke, presenting to the ED yesterday with reports of dark red rectal bleeding per husband and found to have profound anemia with Hgb 3.2.Atmitted with acute symptomatic blood loss anemia in setting of GI bleed, proctocolitis, acute renal failure on CKD, acute metabolic encephalopathy but improved following blood transfusion.   Acute blood loss anemia: Hgb 3.2 on presentation, previously 8.2 a week ago during prior admission. Reports of dark red stool per husband for several days prior to admission. Very limited historian, both patient and husband. No overt bleeding since admission; no bowel movement since admission. Hgb today is 9.5 after receiving 3 units PRBCs. She is hemodynamically stable. Anemia multifactorial in setting of persistent NSAID use, +/- Crohn's flare.    Crohn's disease: capsule Jan 2022 with several small aphthous ulcer throughout small bowel. EGD and Colonoscopy normal in 2021. Normal examination of TI and negative colonic biopsies. CT without contrast yesterday with mild bowel wall thickening and mucosal enhancement involving portions of sigmoid and rectum, possible proctocolitis. Again, no further bleeding since admission and no diarrhea reported. It does not appear that she has been on Humira, which was initially prescribed in 2022. Historically, steroid therapy has been judicious in light of diabetes. Timing of colonoscopy to be determined.   Limited IV access: 20 gauge in left upper arm placed yesterday by ED physician via US after multiple failed IV attempts. Nursing staff  attempting second IV with US guidance for additional access.   Pancreatic lesion: on imaging. Recommend outpatient MRI.      Plan / Recommendations    Follow H/H PPI IV BID Clear liquids Discussion of endoscopic procedures and with Dr. Carver Attempting additional IV access at bedside Absolute avoidance of NSAIDs going forward Stool studies if diarrhea Needs close outpatient follow-up with primary GI regarding Crohn's management     02/18/2023, 8:58 AM  Kalif Kattner W. Jamiya Nims, PhD, ANP-BC [facility]      

## 2023-02-19 ENCOUNTER — Encounter (HOSPITAL_COMMUNITY)
Admission: EM | Disposition: A | Discharge status: Home Health | Payer: Self-pay | Source: Home / Self Care | Attending: Family Medicine

## 2023-02-19 ENCOUNTER — Inpatient Hospital Stay (HOSPITAL_COMMUNITY): Payer: Medicare PPO | Admitting: Anesthesiology

## 2023-02-19 ENCOUNTER — Encounter (HOSPITAL_COMMUNITY): Payer: Self-pay | Admitting: Internal Medicine

## 2023-02-19 DIAGNOSIS — K264 Chronic or unspecified duodenal ulcer with hemorrhage: Secondary | ICD-10-CM | POA: Diagnosis not present

## 2023-02-19 DIAGNOSIS — K2211 Ulcer of esophagus with bleeding: Secondary | ICD-10-CM

## 2023-02-19 DIAGNOSIS — K2210 Ulcer of esophagus without bleeding: Secondary | ICD-10-CM

## 2023-02-19 DIAGNOSIS — K625 Hemorrhage of anus and rectum: Secondary | ICD-10-CM

## 2023-02-19 DIAGNOSIS — D62 Acute posthemorrhagic anemia: Secondary | ICD-10-CM

## 2023-02-19 DIAGNOSIS — I2510 Atherosclerotic heart disease of native coronary artery without angina pectoris: Secondary | ICD-10-CM

## 2023-02-19 DIAGNOSIS — E1149 Type 2 diabetes mellitus with other diabetic neurological complication: Secondary | ICD-10-CM

## 2023-02-19 DIAGNOSIS — Z794 Long term (current) use of insulin: Secondary | ICD-10-CM

## 2023-02-19 DIAGNOSIS — K922 Gastrointestinal hemorrhage, unspecified: Secondary | ICD-10-CM | POA: Diagnosis not present

## 2023-02-19 DIAGNOSIS — I5032 Chronic diastolic (congestive) heart failure: Secondary | ICD-10-CM

## 2023-02-19 DIAGNOSIS — I110 Hypertensive heart disease with heart failure: Secondary | ICD-10-CM

## 2023-02-19 HISTORY — PX: FLEXIBLE SIGMOIDOSCOPY: SHX5431

## 2023-02-19 HISTORY — PX: BIOPSY: SHX5522

## 2023-02-19 HISTORY — PX: ESOPHAGOGASTRODUODENOSCOPY (EGD) WITH PROPOFOL: SHX5813

## 2023-02-19 HISTORY — PX: HEMOSTASIS CLIP PLACEMENT: SHX6857

## 2023-02-19 HISTORY — PX: HEMOSTASIS CONTROL: SHX6838

## 2023-02-19 LAB — CBC
HCT: 24.7 % — ABNORMAL LOW (ref 36.0–46.0)
Hemoglobin: 8.4 g/dL — ABNORMAL LOW (ref 12.0–15.0)
MCH: 30.1 pg (ref 26.0–34.0)
MCHC: 34 g/dL (ref 30.0–36.0)
MCV: 88.5 fL (ref 80.0–100.0)
Platelets: 208 10*3/uL (ref 150–400)
RBC: 2.79 MIL/uL — ABNORMAL LOW (ref 3.87–5.11)
RDW: 16 % — ABNORMAL HIGH (ref 11.5–15.5)
WBC: 9.8 10*3/uL (ref 4.0–10.5)
nRBC: 0.2 % (ref 0.0–0.2)

## 2023-02-19 LAB — TYPE AND SCREEN

## 2023-02-19 LAB — GLUCOSE, CAPILLARY
Glucose-Capillary: 111 mg/dL — ABNORMAL HIGH (ref 70–99)
Glucose-Capillary: 124 mg/dL — ABNORMAL HIGH (ref 70–99)
Glucose-Capillary: 134 mg/dL — ABNORMAL HIGH (ref 70–99)
Glucose-Capillary: 179 mg/dL — ABNORMAL HIGH (ref 70–99)
Glucose-Capillary: 201 mg/dL — ABNORMAL HIGH (ref 70–99)
Glucose-Capillary: 56 mg/dL — ABNORMAL LOW (ref 70–99)
Glucose-Capillary: 86 mg/dL (ref 70–99)

## 2023-02-19 LAB — BASIC METABOLIC PANEL
Anion gap: 10 (ref 5–15)
BUN: 112 mg/dL — ABNORMAL HIGH (ref 8–23)
CO2: 22 mmol/L (ref 22–32)
Calcium: 7 mg/dL — ABNORMAL LOW (ref 8.9–10.3)
Chloride: 102 mmol/L (ref 98–111)
Creatinine, Ser: 3.27 mg/dL — ABNORMAL HIGH (ref 0.44–1.00)
GFR, Estimated: 15 mL/min — ABNORMAL LOW (ref >60)
Glucose, Bld: 75 mg/dL (ref 70–99)
Potassium: 4 mmol/L (ref 3.5–5.1)
Sodium: 134 mmol/L — ABNORMAL LOW (ref 135–145)

## 2023-02-19 LAB — BPAM RBC
Blood Product Expiration Date: 202405232359
ISSUE DATE / TIME: 202404220029
Unit Type and Rh: 5100

## 2023-02-19 LAB — MAGNESIUM: Magnesium: 1.3 mg/dL — ABNORMAL LOW (ref 1.7–2.4)

## 2023-02-19 SURGERY — SIGMOIDOSCOPY, FLEXIBLE
Anesthesia: General

## 2023-02-19 MED ORDER — SODIUM CHLORIDE (PF) 0.9 % IJ SOLN
PREFILLED_SYRINGE | INTRAMUSCULAR | Status: DC | PRN
  Administered 2023-02-19: 5 mL

## 2023-02-19 MED ORDER — PROPOFOL 500 MG/50ML IV EMUL
INTRAVENOUS | Status: DC | PRN
  Administered 2023-02-19: 150 ug/kg/min via INTRAVENOUS

## 2023-02-19 MED ORDER — PANTOPRAZOLE SODIUM 40 MG IV SOLR
40.0000 mg | Freq: Two times a day (BID) | INTRAVENOUS | Status: DC
  Administered 2023-02-23 – 2023-02-24 (×4): 40 mg via INTRAVENOUS
  Filled 2023-02-19 (×4): qty 10

## 2023-02-19 MED ORDER — DEXTROSE 50 % IV SOLN
INTRAVENOUS | Status: AC
  Administered 2023-02-19: 50 mL
  Filled 2023-02-19: qty 50

## 2023-02-19 MED ORDER — PROPOFOL 10 MG/ML IV BOLUS
INTRAVENOUS | Status: DC | PRN
  Administered 2023-02-19: 50 mg via INTRAVENOUS

## 2023-02-19 MED ORDER — SODIUM CHLORIDE 0.9 % IV SOLN: INTRAVENOUS | Status: DC

## 2023-02-19 MED ORDER — PHENYLEPHRINE HCL (PRESSORS) 10 MG/ML IV SOLN
INTRAVENOUS | Status: DC | PRN
  Administered 2023-02-19: 120 ug via INTRAVENOUS
  Administered 2023-02-19: 80 ug via INTRAVENOUS
  Administered 2023-02-19: 160 ug via INTRAVENOUS
  Administered 2023-02-19: 80 ug via INTRAVENOUS

## 2023-02-19 MED ORDER — INSULIN ASPART 100 UNIT/ML IJ SOLN
0.0000 [IU] | INTRAMUSCULAR | Status: DC
  Administered 2023-02-19: 2 [IU] via SUBCUTANEOUS
  Administered 2023-02-19 – 2023-02-20 (×4): 1 [IU] via SUBCUTANEOUS
  Administered 2023-02-21: 2 [IU] via SUBCUTANEOUS
  Administered 2023-02-21 – 2023-02-24 (×8): 1 [IU] via SUBCUTANEOUS
  Administered 2023-02-25: 2 [IU] via SUBCUTANEOUS
  Administered 2023-02-25 (×2): 1 [IU] via SUBCUTANEOUS

## 2023-02-19 MED ORDER — LACTATED RINGERS IV SOLN: INTRAVENOUS | Status: DC

## 2023-02-19 MED ORDER — LIDOCAINE HCL 1 % IJ SOLN
INTRAMUSCULAR | Status: DC | PRN
  Administered 2023-02-19: 50 mg via INTRADERMAL

## 2023-02-19 MED ORDER — MAGNESIUM SULFATE 2 GM/50ML IV SOLN
2.0000 g | Freq: Once | INTRAVENOUS | Status: AC
  Administered 2023-02-19: 2 g via INTRAVENOUS
  Filled 2023-02-19: qty 50

## 2023-02-19 MED ORDER — SODIUM CHLORIDE 0.9 % IV SOLN: INTRAVENOUS | Status: DC | PRN

## 2023-02-19 MED ORDER — PANTOPRAZOLE INFUSION (NEW) - SIMPLE MED
8.0000 mg/h | INTRAVENOUS | Status: AC
  Administered 2023-02-19 – 2023-02-21 (×5): 8 mg/h via INTRAVENOUS
  Filled 2023-02-19: qty 100
  Filled 2023-02-19: qty 80
  Filled 2023-02-19: qty 100
  Filled 2023-02-19: qty 80
  Filled 2023-02-19: qty 100
  Filled 2023-02-19 (×3): qty 80
  Filled 2023-02-19 (×2): qty 100

## 2023-02-19 NOTE — Transfer of Care (Signed)
Immediate Anesthesia Transfer of Care Note  Patient: Heather [name]  Procedure(s) Performed: FLEXIBLE SIGMOIDOSCOPY ESOPHAGOGASTRODUODENOSCOPY (EGD) WITH PROPOFOL BIOPSY HEMOSTASIS CLIP PLACEMENT HEMOSTASIS CONTROL  Patient Location: PACU  Anesthesia Type:General  Level of Consciousness: awake  Airway & Oxygen Therapy: Patient Spontanous Breathing  Post-op Assessment: Report given to RN  Post vital signs: Reviewed and stable  Last Vitals:  Vitals Value Taken Time  BP 145/120 02/19/23 1237  Temp 36.6 C 02/19/23 1230  Pulse 105 02/19/23 1236  Resp 24 02/19/23 1237  SpO2 100 % 02/19/23 1236  Vitals shown include unvalidated device data.  Last Pain:  Vitals:   02/19/23 1140  TempSrc:   PainSc: 0-No pain      Patients Stated Pain Goal: 0 (02/18/23 2000)  Complications: No notable events documented.

## 2023-02-19 NOTE — Progress Notes (Signed)
Hypoglycemic Event  CBG: 56   Treatment: D50 50 mL (25 gm)  Symptoms: None  Follow-up CBG: [id number]  CBG Result:134  Possible Reasons for Event: Inadequate meal intake  Comments/MD notified:Dr. Lavone Neri Xzander Gilham

## 2023-02-19 NOTE — Progress Notes (Signed)
We will proceed with EGD and colonoscopy as scheduled.  I thoroughly discussed with the patient the procedure, including the risks involved. Patient understands what the procedure involves including the benefits and any risks. Patient understands alternatives to the proposed procedure. Risks including (but not limited to) bleeding, tearing of the lining (perforation), rupture of adjacent organs, problems with heart and lung function, infection, and medication reactions. A small percentage of complications may require surgery, hospitalization, repeat endoscopic procedure, and/or transfusion.  Patient understood and agreed.  Heather Wisinski Castaneda, MD Gastroenterology and Hepatology [facility]  

## 2023-02-19 NOTE — Op Note (Signed)
[facility] Patient Name: Heather [name] Procedure Date: 02/19/2023 12:15 PM MRN: [id number] Date of Birth: [birth year] Attending MD: Katrinka Blazing , , [id number] CSN: [id number] Age: 72 Admit Type: Outpatient Procedure:                Flexible Sigmoidoscopy Indications:              Rectal hemorrhage Providers:                Katrinka Blazing, Virgie Dad RN, RN, Burke Keels, Technician Referring MD:              Medicines:                Monitored Anesthesia Care Complications:            No immediate complications. Estimated Blood Loss:     Estimated blood loss: none. Procedure:                Pre-Anesthesia Assessment:                           - Prior to the procedure, a History and Physical                            was performed, and patient medications, allergies                            and sensitivities were reviewed. The patient's                            tolerance of previous anesthesia was reviewed.                           - The risks and benefits of the procedure and the                            sedation options and risks were discussed with the                            patient. All questions were answered and informed                            consent was obtained.                           - ASA Grade Assessment: III - A patient with severe                            systemic disease.                           After obtaining informed consent, the scope was                            passed under direct vision. The [id number]-[id number]                            (  [id number]) scope was introduced through the anus and                            advanced to the the sigmoid colon. After obtaining                            informed consent, the scope was passed under direct                            vision.The flexible sigmoidoscopy was performed                            with difficulty due to poor bowel prep and poor                             endoscopic visualization. The patient tolerated the                            procedure well. The quality of the bowel                            preparation was poor. Scope In: 12:15:30 PM Scope Out: 12:19:46 PM Total Procedure Duration: 0 hours 4 minutes 16 seconds  Findings:      The perianal and digital rectal examinations were normal.      A large amount of solid stool was found in the rectum, in the       recto-sigmoid colon and in the sigmoid colon, precluding visualization.       Stool was black - melena. Impression:               - Preparation of the colon was poor.                           - Stool in the rectum, in the recto-sigmoid colon                            and in the sigmoid colon.                           - No specimens collected. Moderate Sedation:      Per Anesthesia Care Recommendation:           - Return patient to ICU for ongoing care.                           - If evidence of diarrhea/rectal bleeding, repeat                            colonoscopy given poor prep. Procedure Code(s):        --- Professional ---                           [phone], Sigmoidoscopy, flexible; diagnostic,  including collection of specimen(s) by brushing or                            washing, when performed (separate procedure) Diagnosis Code(s):        --- Professional ---                           K62.5, Hemorrhage of anus and rectum CPT copyright 2022 American Medical Association. All rights reserved. The codes documented in this report are preliminary and upon coder review may  be revised to meet current compliance requirements. Katrinka Blazing, MD Katrinka Blazing,  02/19/2023 12:37:23 PM This report has been signed electronically. Number of Addenda: 0

## 2023-02-19 NOTE — Op Note (Signed)
[facility] Patient Name: Heather [name] Procedure Date: 02/19/2023 11:19 AM MRN: [id number] Date of Birth: [birth year] Attending MD: Katrinka Blazing , , [id number] CSN: [id number] Age: 72 Admit Type: Outpatient Procedure:                Upper GI endoscopy Indications:              Hematochezia Providers:                Katrinka Blazing, Buel Ream. Thomasena Edis RN, RN, Burke Keels, Technician Referring MD:              Medicines:                Monitored Anesthesia Care Complications:            No immediate complications. Estimated Blood Loss:     Estimated blood loss: none. Procedure:                Pre-Anesthesia Assessment:                           - Prior to the procedure, a History and Physical                            was performed, and patient medications, allergies                            and sensitivities were reviewed. The patient's                            tolerance of previous anesthesia was reviewed.                           - The risks and benefits of the procedure and the                            sedation options and risks were discussed with the                            patient. All questions were answered and informed                            consent was obtained.                           - ASA Grade Assessment: III - A patient with severe                            systemic disease.                           After obtaining informed consent, the endoscope was                            passed under direct vision. Throughout the  procedure, the patient's blood pressure, pulse, and                            oxygen saturations were monitored continuously. The                            [id number]-[id number] ([id number]) scope was introduced through the                            mouth, and advanced to the second part of duodenum.                            The upper GI endoscopy was accomplished without                             difficulty. The patient tolerated the procedure                            well. Scope In: 11:51:18 AM Scope Out: 12:15:03 PM Total Procedure Duration: 0 hours 23 minutes 45 seconds  Findings:      Many cratered esophageal ulcers with no stigmata of recent bleeding were       found 27 to 36 cm from the incisors. Severe associated esophagitis was       observed. The largest lesion was 20 mm in largest dimension. Biopsies       were taken with a cold forceps for histology.      The entire examined stomach was normal. Biopsies were taken with a cold       forceps for Helicobacter pylori testing.      Multiple non-obstructing oozing cratered duodenal ulcers with oozing       hemorrhage (Forrest Class Ib) and with associated clots were found in       the first portion of the duodenum and in the second portion of the       duodenum. The largest lesion was 20 mm in largest dimension. After       removal of one of the clots in the bulb, an oozing vessel was       identified. For hemostasis, one hemostatic clip was successfully placed.       Clip manufacturer: [employer]. There was no bleeding at the end       of the procedure. Clipped area and secon portion of duodenum with       associated clots were successfully injected with a total 5 mL of a 0.1       mg/mL solution of epinephrine for hemostasis.      Patient presented some tachycardia up to 130s after epinephrine       injection. Biopsies were taken with a cold forceps for histology.      Note: Extensive ulceration, query possible upper GI Crohn's but also       superimposed component of NSAID use. Impression:               - Esophageal ulcers and esophagitis with no                            stigmata of recent bleeding. Biopsied.                           -  Normal stomach. Biopsied.                           - Non-obstructing oozing duodenal ulcers with                            oozing hemorrhage (Forrest  Class Ib) and associated                            clots. Clip was placed. Clip manufacturer: [employer]. Injected. Biopsied. Moderate Sedation:      Per Anesthesia Care Recommendation:           - Return patient to ICU for ongoing care.                           - Clear liquid diet today.                           - Await pathology results.                           - Switch to pantoprazole drip for 72 hours.                           - Sucralfate slurry 1 g every 6 hours.                           - If clinical evidence of recurrent bleeding, low                            threshold to repeat EGD.                           - No high dose aspirin, ibuprofen, naproxen, or                            other non-steroidal anti-inflammatory drugs.                           - If pathology consistent with Crohn's, start                            methylprednisolone 60 mg IV qday. Procedure Code(s):        --- Professional ---                           43255, 59, Esophagogastroduodenoscopy, flexible,                            transoral; with control of bleeding, any method                           43239, Esophagogastroduodenoscopy, flexible,  transoral; with biopsy, single or multiple Diagnosis Code(s):        --- Professional ---                           K22.10, Ulcer of esophagus without bleeding                           K26.4, Chronic or unspecified duodenal ulcer with                            hemorrhage                           K92.1, Melena (includes Hematochezia) CPT copyright 2022 American Medical Association. All rights reserved. The codes documented in this report are preliminary and upon coder review may  be revised to meet current compliance requirements. Katrinka Blazing, MD Katrinka Blazing,  02/19/2023 12:34:31 PM This report has been signed electronically. Number of Addenda: 0

## 2023-02-19 NOTE — Brief Op Note (Signed)
02/19/2023  12:32 PM  PATIENT:  Heather [name]  72 y.o. female  PRE-OPERATIVE DIAGNOSIS:  profound anemia, rectal bleeding  POST-OPERATIVE DIAGNOSIS:  EGD: esophageal ulcerations @ 27cm-36cm; esophageal ulceration biopsies; small bowel ulcer with hemostasis clip placement x1; gastric biopsies  PROCEDURE:  Procedure(s): FLEXIBLE SIGMOIDOSCOPY (N/A) ESOPHAGOGASTRODUODENOSCOPY (EGD) WITH PROPOFOL (N/A) BIOPSY HEMOSTASIS CLIP PLACEMENT HEMOSTASIS CONTROL  SURGEON:  Surgeon(s) and Role: Panel 1:    * Dolores Frame, MD - Primary  Patient underwent EGD and flexible sigmoidoscopy under propofol sedation.  Tolerated the procedure adequately.    EGD FINDINGS: -Many cratered esophageal ulcers with no stigmata of recent bleeding were found 27 to 36 cm from the incisors. Severe associated esophagitis was observed.  The largest lesion was 20 mm in largest dimension.  Biopsies were taken with a cold forceps for histology.  - The entire examined stomach was normal.  Biopsies were taken with a cold forceps for Helicobacter pylori testing.  - Multiple non-obstructing oozing cratered duodenal ulcers with oozing hemorrhage (Forrest Class Ib) and with associated clots were found in the first portion of the duodenum and in the second portion of the duodenum.  The largest lesion was 20 mm in largest dimension. After removal of one of the clots in the bulb, an oozing vessel was identified.  For hemostasis, one hemostatic clip was successfully placed.  Clip manufacturer: [employer].  There was no bleeding at the end of the procedure.  Clipped area and secon portion of duodenum with associated clots were successfully injected with a total 5 mL of a 0.1 mg/mL solution of epinephrine for hemostasis.   FLEXIBLE SIGMOIDOSCOPY FINDINGS: -A large amount of solid stool was found in the rectum, in the recto-sigmoid colon and in the sigmoid colon, precluding visualization. Stool was black -  melena.  Note: Extensive ulceration, query possible upper GI Crohn's but also superimposed component of NSAID use.  RECOMMENDATIONS - Return patient to ICU for ongoing care.  - Clear liquid diet today.  - Await pathology results.  - Switch to pantoprazole drip for 72 hours. - Sucralfate slurry 1 g every 6 hours. - If clinical evidence of recurrent bleeding, low threshold to repeat EGD. - No high dose aspirin, ibuprofen, naproxen, or other non-steroidal anti-inflammatory drugs.  - If pathology consistent with Crohn's, start methylprednisolone 60 mg IV qday. - If evidence of diarrhea/rectal bleeding, repeat colonoscopy given poor prep.  Katrinka Blazing, MD Gastroenterology and Hepatology [facility]

## 2023-02-19 NOTE — Anesthesia Preprocedure Evaluation (Signed)
Anesthesia Evaluation  Patient identified by MRN, date of birth, ID band Patient awake    Reviewed: Allergy & Precautions, H&P , NPO status , Patient's Chart, lab work & pertinent test results, reviewed documented beta blocker date and time   Airway Mallampati: III  TM Distance: >3 FB Neck ROM: Full    Dental  (+) Dental Advisory Given, Teeth Intact   Pulmonary shortness of breath (wheel chair bound) and with exertion, pneumonia   Pulmonary exam normal breath sounds clear to auscultation       Cardiovascular Exercise Tolerance: Poor METS (wheelchair bound): hypertension, Pt. on medications and Pt. on home beta blockers + CAD and +CHF  Normal cardiovascular exam Rhythm:Regular Rate:Normal  1. Left ventricular ejection fraction, by estimation, is 60 to 65%. The  left ventricle has normal function. The left ventricle has no regional  wall motion abnormalities. Left ventricular diastolic parameters are  consistent with Grade I diastolic  dysfunction (impaired relaxation).   2. Right ventricular systolic function is normal. The right ventricular  size is normal.   3. Left atrial size was moderately dilated.   4. Right atrial size was mildly dilated.   5. The mitral valve is normal in structure. Trivial mitral valve  regurgitation. No evidence of mitral stenosis. Moderate mitral annular  calcification.   6. The aortic valve is tricuspid. There is moderate calcification of the  aortic valve. Aortic valve regurgitation is not visualized. Mild aortic  valve stenosis. Aortic valve area, by VTI measures 1.95 cm. Aortic valve  mean gradient measures 7.0 mmHg.  Aortic valve Vmax measures 1.69 m/s.   7. The inferior vena cava is normal in size with greater than 50%  respiratory variability, suggesting right atrial pressure of 3 mmHg.   Comparison(s): EF 60-65%.     Neuro/Psych  Headaches PSYCHIATRIC DISORDERS Anxiety Depression      Neuromuscular disease CVA    GI/Hepatic Neg liver ROS, hiatal hernia,GERD  Medicated,,  Endo/Other  diabetes, Well Controlled, Type 2, Insulin Dependent    Renal/GU Renal disease  negative genitourinary   Musculoskeletal  (+) Arthritis , Osteoarthritis,    Abdominal   Peds negative pediatric ROS (+)  Hematology  (+) Blood dyscrasia, anemia   Anesthesia Other Findings   Reproductive/Obstetrics negative OB ROS                             Anesthesia Physical Anesthesia Plan  ASA: 3  Anesthesia Plan: General   Post-op Pain Management: Minimal or no pain anticipated   Induction: Intravenous  PONV Risk Score and Plan: Propofol infusion  Airway Management Planned: Nasal Cannula and Natural Airway  Additional Equipment:   Intra-op Plan:   Post-operative Plan: Possible Post-op intubation/ventilation  Informed Consent: I have reviewed the patients History and Physical, chart, labs and discussed the procedure including the risks, benefits and alternatives for the proposed anesthesia with the patient or authorized representative who has indicated his/her understanding and acceptance.     Dental advisory given  Plan Discussed with: CRNA and Surgeon  Anesthesia Plan Comments: (Will give magnesium 2 grams IVPB before the procedure)        Anesthesia Quick Evaluation

## 2023-02-19 NOTE — Anesthesia Postprocedure Evaluation (Signed)
Anesthesia Post Note  Patient: Heather [name]  Procedure(s) Performed: FLEXIBLE SIGMOIDOSCOPY ESOPHAGOGASTRODUODENOSCOPY (EGD) WITH PROPOFOL BIOPSY HEMOSTASIS CLIP PLACEMENT HEMOSTASIS CONTROL  Patient location during evaluation: PACU Anesthesia Type: General Level of consciousness: awake and alert and oriented Pain management: pain level controlled Vital Signs Assessment: post-procedure vital signs reviewed and stable Respiratory status: spontaneous breathing, nonlabored ventilation and respiratory function stable Cardiovascular status: blood pressure returned to baseline and stable Postop Assessment: no apparent nausea or vomiting Anesthetic complications: no  No notable events documented.   Last Vitals:  Vitals:   02/19/23 1249 02/19/23 1250  BP: (!) 99/39 (!) 99/39  Pulse: (!) 105 (!) 105  Resp: (!) 8 14  Temp:  36.4 C  SpO2: 100% 100%    Last Pain:  Vitals:   02/19/23 1250  TempSrc:   PainSc: Asleep                 Cassara Nida C Dimitra Woodstock

## 2023-02-19 NOTE — Progress Notes (Signed)
PROGRESS NOTE    Heather [name]  ZOX:[id number] DOB: 05-11-1951 DOA: 02/17/2023 PCP: [name], MD   Brief Narrative:    Heather [name] is a 72 y.o. female with medical history significant for diabetes mellitus, Crohn's disease, CKD 4, diastolic CHF, pulmonary hypertension, gout. Patient presented to the ED with reports of dark red stools. Per notes, patient was confused in triage.  After transfusion in ED, mental status improved.  Patient was admitted with symptomatic acute blood loss anemia in the setting of active GI bleed.  She has had 3 unit PRBC transfusion with improvement in hemoglobin levels and underwent GI evaluation with EGD and flex sigmoidoscopy on 4/23 with findings of esophageal ulcers and duodenal ulcers with oozing hemorrhage requiring clips.  Biopsies also taken for possible Crohn's.  GI recommending clear liquid diet and Protonix infusion for 72 hours.  Assessment & Plan:   Principal Problem:   GI bleed Active Problems:   Acute renal failure superimposed on stage 4 chronic kidney disease   Acute metabolic encephalopathy   Acute on chronic anemia   Pancreatic lesion   Proctocolitis   Hypoalbuminemia   Diabetes mellitus   Essential hypertension   Gout   Cerebrovascular accident (CVA) due to embolism of cerebral artery  Assessment and Plan:  Acute symptomatic blood loss anemia secondary to GI bleed With acute on chronic anemia.  Hgb down to 3.2, recent hemoglobin/14 on discharge from the hospital 8.2, she received 1 unit PRBC for hemoglobin of 6.7.  Reports bloody stools over the past 3 days, no hematemesis.  Positive NSAID use history. Not on anticoagulation.  10/2020, capsule endoscopy-several small aphthous appearing ulcers scattered in small bowel,-mild IBD/Crohn's. Colonoscopy and EGD 2021 were unremarkable. -Status post 3 unit PRBC transfusion -Underwent EGD and flex sigmoidoscopy 4/23 with recommendations to remain on PPI drip for 72 hours as well  as clear liquid diet   Proctocolitis Presenting with bloody stools, history of Crohn's.  Leukocytosis of 17.2.  ESR elevated at >130.  Serum ferritin 1462.  Rules out for sepsis.  Denies pain with bowel movements.  CT abdomen and pelvis without contrast-  Mild bowel wall thickening and mucosal enhancement involving portions of the sigmoid colon and rectum may reflect proctocolitis. -IV ceftriaxone and metronidazole for now -Follow-up GI recs  -Reports dysuria, follow-up UA   Pancreatic lesion Incidental finding on CT today 02/17/2023-  Low-density pancreatic lesion. When the patient is clinically stable and able to follow directions and hold their breath (preferably as an outpatient) further evaluation with dedicated abdominal MRI could be considered for further characterization. -Follow-up as outpatient   Acute metabolic encephalopathy Patient initially confused in triage, mental status is improved after 1 unit PRBC.  Encephalopathy likely due to severe anemia, ??  Proctocolitis.  She has a leukocytosis of 17.2.  Also reports dysuria. - Monitor for now, defer to brain imaging for now -Resume home Keppra, venlafaxine - Follow-up UA.   Acute renal failure superimposed on stage 4 chronic kidney disease AKI on CKD stage IV.  Cr- 3.26, last discharge creatinine 4/14- 1.8. In the setting of severe anemia GI bleed.  -Continue daily oral bicarbonate and administer IV fluid for now while NPO   Hypoalbuminemia Albumin 1.6.  Calcium 7.5, corrected calcium - 9.4.   Diabetes mellitus - HgbA1c pending - SSI- S q4h   Essential hypertension Initial unreliable blood pressures.  Arterial line placed in ED. -Will use as needed IV labetalol  greater than 180 -Hold home antihypertensives metoprolol  37.5mg     Gout Resume allopurinol    DVT prophylaxis: SCDs Code Status: Full Family Communication: None at bedside Disposition Plan: GI evaluation pending Status is: Inpatient Remains inpatient  appropriate because: Need for IV medications.  Consultants:  GI  Procedures:  EGD and flex sigmoidoscopy 4/23  Antimicrobials:  Anti-infectives (From admission, onward)    Start     Dose/Rate Route Frequency Ordered Stop   02/17/23 2200  [MAR Hold]  cefTRIAXone (ROCEPHIN) 2 g in sodium chloride 0.9 % 100 mL IVPB        (MAR Hold since Tue 02/19/2023 at 0930.Hold Reason: Transfer to a Procedural area)   2 g 200 mL/hr over 30 Minutes Intravenous Every 24 hours 02/17/23 2155     02/17/23 2200  [MAR Hold]  metroNIDAZOLE (FLAGYL) IVPB 500 mg        (MAR Hold since Tue 02/19/2023 at 0930.Hold Reason: Transfer to a Procedural area)   500 mg 100 mL/hr over 60 Minutes Intravenous Every 12 hours 02/17/23 2155        Subjective: Patient seen and evaluated today with no new acute complaints or concerns. No acute concerns or events noted overnight.  Objective: Vitals:   02/19/23 1243 02/19/23 1245 02/19/23 1249 02/19/23 1250  BP: (!) 155/144 (!) 155/144 (!) 99/39 (!) 99/39  Pulse: 70 (!) 109 (!) 105 (!) 105  Resp: 13 13 (!) 8 14  Temp:    97.6 F (36.4 C)  TempSrc:      SpO2: 100% 100% 100% 100%  Weight:      Height:        Intake/Output Summary (Last 24 hours) at 02/19/2023 1309 Last data filed at 02/19/2023 1223 Gross per 24 hour  Intake 700 ml  Output 550 ml  Net 150 ml   Filed Weights   02/17/23 2028 02/18/23 0500 02/19/23 0951  Weight: 55 kg 55 kg 55 kg    Examination:  General exam: Appears calm and comfortable  Respiratory system: Clear to auscultation. Respiratory effort normal. Cardiovascular system: S1 & S2 heard, RRR.  Gastrointestinal system: Abdomen is soft Central nervous system: Alert and awake Extremities: No edema Skin: No significant lesions noted Psychiatry: Flat affect.    Data Reviewed: I have personally reviewed following labs and imaging studies  CBC: Recent Labs  Lab 02/17/23 1625 02/17/23 1735 02/18/23 0634 02/19/23 0255  WBC 17.2* 16.6*  13.6* 9.8  NEUTROABS  --  14.4*  --   --   HGB 3.2* 3.8* 9.5* 8.4*  HCT 10.3* 12.6* 27.6* 24.7*  MCV 101.0* 102.4* 86.8 88.5  PLT 348 367 249 208   Basic Metabolic Panel: Recent Labs  Lab 02/17/23 1604 02/18/23 0634 02/19/23 0255  NA 135 135 134*  K 4.6 4.4 4.0  CL 100 102 102  CO2 21* 21* 22  GLUCOSE 146* 132* 75  BUN 115* 115* 112*  CREATININE 3.26* 3.13* 3.27*  CALCIUM 7.5* 7.4* 7.0*  MG  --   --  1.3*   GFR: Estimated Creatinine Clearance: 13.8 mL/min (A) (by C-G formula based on SCr of 3.27 mg/dL (H)). Liver Function Tests: Recent Labs  Lab 02/17/23 1604  AST 25  ALT 11  ALKPHOS 94  BILITOT 0.6  PROT 4.3*  ALBUMIN 1.6*   Recent Labs  Lab 02/17/23 1735  LIPASE 21   No results for input(s): "AMMONIA" in the last 168 hours. Coagulation Profile: No results for input(s): "INR", "PROTIME" in the last 168 hours. Cardiac Enzymes: No results for input(s): "  CKTOTAL", "CKMB", "CKMBINDEX", "TROPONINI" in the last 168 hours. BNP (last 3 results) No results for input(s): "PROBNP" in the last 8760 hours. HbA1C: Recent Labs    02/18/23 0634  HGBA1C 6.4*   CBG: Recent Labs  Lab 02/19/23 0028 02/19/23 0452 02/19/23 0544 02/19/23 0730 02/19/23 1000  GLUCAP 86 56* 134* 111* 124*   Lipid Profile: No results for input(s): "CHOL", "HDL", "LDLCALC", "TRIG", "CHOLHDL", "LDLDIRECT" in the last 72 hours. Thyroid Function Tests: No results for input(s): "TSH", "T4TOTAL", "FREET4", "T3FREE", "THYROIDAB" in the last 72 hours. Anemia Panel: Recent Labs    02/17/23 1735  FERRITIN 1,462*  TIBC 135*  IRON 51   Sepsis Labs: No results for input(s): "PROCALCITON", "LATICACIDVEN" in the last 168 hours.  Recent Results (from the past 240 hour(s))  MRSA Next Gen by PCR, Nasal     Status: Abnormal   Collection Time: 02/17/23  7:35 PM   Specimen: Nasal Mucosa; Nasal Swab  Result Value Ref Range Status   MRSA by PCR Next Gen DETECTED (A) NOT DETECTED Final    Comment:  RESULT CALLED TO, READ BACK BY AND VERIFIED WITH: KING J @ 0747 ON [id number] BY HENDERSON L (NOTE) The GeneXpert MRSA Assay (FDA approved for NASAL specimens only), is one component of a comprehensive MRSA colonization surveillance program. It is not intended to diagnose MRSA infection nor to guide or monitor treatment for MRSA infections. Test performance is not FDA approved in patients less than 3 years old. Performed at [facility], [address], [place], [place] [address]          Radiology Studies: CT ABDOMEN PELVIS WO CONTRAST  Result Date: 02/17/2023 CLINICAL DATA:  Concern for bowel obstruction. Patient reportedly has red stool. EXAM: CT ABDOMEN AND PELVIS WITHOUT CONTRAST TECHNIQUE: Multidetector CT imaging of the abdomen and pelvis was performed following the standard protocol without IV contrast. RADIATION DOSE REDUCTION: This exam was performed according to the departmental dose-optimization program which includes automated exposure control, adjustment of the mA and/or kV according to patient size and/or use of iterative reconstruction technique. COMPARISON:  CT abdomen pelvis dated 02/04/2023. FINDINGS: Lower chest: Moderate bilateral pleural effusions with associated atelectasis are partially imaged. The heart is mildly enlarged. The blood pool is hypoattenuating, suggestive of anemia. Hepatobiliary: No focal liver abnormality is seen. Status post cholecystectomy. No biliary dilatation. Pancreas: Atrophic. A 1.8 cm low-density lesion in the pancreatic tail appears unchanged. No pancreatic ductal dilatation or surrounding inflammatory changes. Spleen: Normal in size without focal abnormality. Adrenals/Urinary Tract: Adrenal glands are unremarkable. Bilateral punctate calcifications in both kidneys may represent nonobstructing calculi measuring up to 2 mm and/or vascular calcifications. No suspicious focal lesion or hydronephrosis on either side. Bladder is unremarkable. Stomach/Bowel:  Stomach is within normal limits. The appendix is not definitely identified, however no pericecal inflammatory changes are noted to suggest acute appendicitis. There is a mild bowel wall thickening and mucosal enhancement involving portions of the sigmoid colon and rectum. No significant surrounding inflammatory changes. No bowel obstruction. Vascular/Lymphatic: Aortic atherosclerosis. No enlarged abdominal or pelvic lymph nodes. Reproductive: Status post hysterectomy. No adnexal masses. Other: No abdominal wall hernia or abnormality. No abdominopelvic ascites. Anasarca is noted Musculoskeletal: There has been slight progression since 02/04/2023 of a T12 compression deformity with approximately 50% height loss centrally and 3 mm retropulsion into the central canal. Fixation hardware is seen in the right femur. Fixation hardware in the lumbar spine from L1-L4. IMPRESSION: 1. Mild bowel wall thickening and mucosal enhancement involving  portions of the sigmoid colon and rectum may reflect proctocolitis. No bowel obstruction. 2. Moderate bilateral pleural effusions with associated atelectasis. 3. Slight progression since 02/04/2023 of a T12 compression deformity with approximately 50% height loss centrally and 3 mm retropulsion into the central canal. 4. Low-density pancreatic lesion. When the patient is clinically stable and able to follow directions and hold their breath (preferably as an outpatient) further evaluation with dedicated abdominal MRI could be considered for further characterization. Aortic Atherosclerosis (ICD10-I70.0). Electronically Signed   By: Romona Curls M.D.   On: 02/17/2023 17:36        Scheduled Meds:  [MAR Hold] (feeding supplement) PROSource Plus  30 mL Oral TID BM   [MAR Hold] sodium chloride   Intravenous Once   [MAR Hold] allopurinol  100 mg Oral Daily   [MAR Hold] atorvastatin  80 mg Oral QHS   [MAR Hold] Chlorhexidine Gluconate Cloth  6 each Topical Q0600   [MAR Hold] insulin  aspart  0-6 Units Subcutaneous Q4H   [MAR Hold] levETIRAcetam  500 mg Oral BID   [MAR Hold] lipase/protease/amylase  36,000 Units Oral TID WC   [MAR Hold] multivitamin-lutein  1 capsule Oral Daily   [MAR Hold] mupirocin ointment   Nasal BID   [START ON 02/23/2023] pantoprazole  40 mg Intravenous Q12H   [MAR Hold] sodium bicarbonate  650 mg Oral Daily   [MAR Hold] venlafaxine XR  150 mg Oral Daily   Continuous Infusions:  sodium chloride     [MAR Hold] cefTRIAXone (ROCEPHIN)  IV Stopped (02/18/23 2126)   lactated ringers     [MAR Hold] metronidazole Stopped (02/18/23 2305)   pantoprazole       LOS: 2 days    Time spent: 35 minutes    [place] D Sherryll Burger, DO [facility]  If 7PM-7AM, please contact night-coverage [email or web address] 02/19/2023, 1:09 PM

## 2023-02-19 NOTE — TOC Initial Note (Signed)
Transition of Care [facility] - Initial/Assessment Note    Patient Details  Name: Heather [name] MRN: [id number] Date of Birth: March 30, 1951  [facility] CM/SW Contact:    Leitha Bleak, RN Phone Number: 02/19/2023, 3:51 PM  Clinical Narrative:    CM spoke with spouse, he states patient just wants to lay in bed all the time. No will to live since the loss of their son in 2001. She came home from [facility] and laid in bed and got weaker. She did not participate with HHPT very well.  CM ask spouse what would be a safe discharge plan? He wants what ever insurance will pay for. TOC following, patient needs PT eval.                   Barriers to Discharge: Continued Medical Work up   Patient Goals and CMS Choice   Expected Discharge Plan and Services      Living arrangements for the past 2 months: Single Family Home                    Prior Living Arrangements/Services Living arrangements for the past 2 months: Single Family Home Lives with:: Spouse   Activities of Daily Living Home Assistive Devices/Equipment: CBG Meter, Eyeglasses, Wheelchair, Shower chair with back ADL Screening (condition at time of admission) Patient's cognitive ability adequate to safely complete daily activities?: Yes Is the patient deaf or have difficulty hearing?: No Does the patient have difficulty seeing, even when wearing glasses/contacts?: No Does the patient have difficulty concentrating, remembering, or making decisions?: No Patient able to express need for assistance with ADLs?: Yes Does the patient have difficulty dressing or bathing?: Yes Independently performs ADLs?: No Communication: Independent Dressing (OT): Needs assistance Is this a change from baseline?: Pre-admission baseline Grooming: Needs assistance Is this a change from baseline?: Pre-admission baseline Feeding: Independent Bathing: Needs assistance Is this a change from baseline?: Pre-admission  baseline Toileting: Needs assistance Is this a change from baseline?: Pre-admission baseline In/Out Bed: Needs assistance Is this a change from baseline?: Pre-admission baseline Walks in Home: Dependent Is this a change from baseline?: Pre-admission baseline Does the patient have difficulty walking or climbing stairs?: Yes Weakness of Legs: Both Weakness of Arms/Hands: Right  Permission Sought/Granted      Emotional Assessment     Admission diagnosis:  GI bleed [K92.2] Gastrointestinal hemorrhage, unspecified gastrointestinal hemorrhage type [K92.2] Patient Active Problem List   Diagnosis Date Noted   Rectal bleeding 02/19/2023   ABLA (acute blood loss anemia) 02/19/2023   GI bleed 02/17/2023   Pancreatic lesion 02/17/2023   Proctocolitis 02/17/2023   Hypoalbuminemia 02/17/2023   Hyperosmolar hyperglycemic state (HHS) 02/05/2023   High anion gap metabolic acidosis 02/05/2023   Poor intravenous access 02/05/2023   Fracture 12/11/2022   Pseudohyponatremia 11/12/2022   Thrombocytosis 11/12/2022   DKA (diabetic ketoacidosis) 11/12/2022   Uncontrolled type 2 diabetes mellitus with hyperglycemia 11/11/2022   DKA, type 2 10/16/2022   History of right shoulder fracture 10/15/2022   Aortic atherosclerosis 08/01/2022   Recurrent falls 07/04/2022   Humeral head fracture 07/04/2022   Right leg weakness 06/20/2022   Anxiety 06/20/2022   UTI (urinary tract infection) 06/20/2022   Malnutrition of moderate degree 06/20/2022   Hyperkalemia 06/19/2022   Type 2 diabetes mellitus with hyperosmolar nonketotic hyperglycemia 05/27/2022   Acute kidney failure 03/17/2022   Mixed diabetic hyperlipidemia associated with type 2 diabetes mellitus 03/16/2022   Chronic diastolic CHF (congestive heart  failure) 03/16/2022   Decubitus ulcer of left buttock, stage 2 03/16/2022   Cellulitis of left hand 11/30/2021   Closed fracture of part of upper end of humerus 11/30/2021   At risk for falls  11/28/2021   Neoplasm of meninges 11/28/2021   Sepsis 11/28/2021   Acute on chronic anemia 11/28/2021   Cerebral thrombosis with cerebral infarction 08/28/2021   Cerebrovascular accident (CVA) due to embolism of cerebral artery    Pressure injury of skin 08/26/2021   Acute respiratory failure with hypoxia 08/25/2021   Acute metabolic encephalopathy 08/25/2021   Malignant hypertension 08/25/2021   Thrombocytopenia 08/25/2021   Anemia due to chronic kidney disease 08/25/2021   Blurry vision, bilateral 08/16/2021   Hyponatremia 04/01/2021   COVID-19 03/30/2021   Senile osteoporosis 02/10/2021   Macrocytic anemia    E coli enteritis 01/09/2021   Acute pancreatitis 01/08/2021   Acute renal failure superimposed on stage 4 chronic kidney disease 01/07/2021   Diarrhea    Acute hypokalemia 01/06/2021   Long-term use of immunosuppressant medication-anticipated 11/28/2020   Elevated blood-pressure reading, without diagnosis of hypertension 05/30/2020   Meningioma 05/30/2020   Headache 05/17/2020   Hypokalemia 05/17/2020   Hypomagnesemia 05/17/2020   Dehydration 05/17/2020   Hypertensive urgency 05/17/2020   Visual disturbance 05/17/2020   Left hip pain 09/22/2019   Lumbar radiculopathy 09/22/2019   Postoperative anemia due to acute blood loss 09/18/2019   Postoperative urinary retention 09/18/2019   Closed fracture of distal end of radius 05/26/2019   Coronary arteriosclerosis 07/15/2018   Closed right hip fracture 04/08/2018   Elevated lipase    Crohn's disease of small intestine without complication    Protein-calorie malnutrition, severe 03/05/2018   Femur fracture, right 03/30/2017   Neck pain 03/12/2017   Neuropathy 03/02/2015   Pain in the chest    Chest pain 12/22/2014   Essential hypertension 12/22/2014   Nausea & vomiting 12/22/2014   Uncontrolled type 2 diabetes mellitus with hyperglycemia, with long-term current use of insulin 12/22/2014   Hiatal hernia 12/22/2014    GERD (gastroesophageal reflux disease) 12/22/2014   Gout 12/22/2014   Mixed hyperlipidemia 12/22/2014   Depression 12/22/2014   Diabetes mellitus 12/22/2014   Fusion of spine, cervicothoracic region 11/01/2014   Pseudophakia, left eye 10/20/2014   Chronic kidney disease (CKD), stage IV (severe) 08/25/2014   Cervical stenosis of spine 08/26/2013   Vitamin B12 deficiency 07/08/2011   Personal history of failed moderate sedation 04/16/2011   Chronic Nausea and Vomiting - ? gastroapresis 04/16/2011   Early satiety 04/16/2011   Irritable bowel syndrome 04/16/2011   Benign paroxysmal positional vertigo 11/20/2010   TRICUSPID REGURGITATION, MODERATE 11/20/2010   PULMONARY HYPERTENSION, MILD 11/20/2010   LEG EDEMA, BILATERAL 11/20/2010   DYSPNEA 11/20/2010   Prolonged QT interval 11/20/2010   CHEST WALL PAIN, HX OF 11/20/2010   PCP:  [name], MD Pharmacy:   [facility] [address], [place] - [address] [place] [address] Phone: [phone] Fax: [phone]  [id number] - [place], [place] - [address] [place] [address] Phone: [phone] Fax: [phone]   Social Determinants of Health (SDOH) Social History: SDOH Screenings   Food Insecurity: No Food Insecurity (02/17/2023)  Housing: Low Risk  (02/17/2023)  Transportation Needs: No Transportation Needs (02/17/2023)  Utilities: Not At Risk (02/17/2023)  Tobacco Use: Low Risk  (02/19/2023)   SDOH Interventions:     Readmission Risk Interventions    02/06/2023  8:28 AM 11/14/2022   11:31 AM 11/12/2022    4:06 PM  Readmission Risk Prevention Plan  Transportation Screening Complete  Complete  Medication Review (RN Care Manager) Complete  Complete  PCP or Specialist appointment within 3-5 days of discharge  Complete Not Complete  HRI or Home Care Consult Complete  Complete  SW Recovery Care/Counseling Consult Complete  Complete  Palliative Care Screening Not  Applicable  Not Applicable  Skilled Nursing Facility Not Applicable  Complete

## 2023-02-20 DIAGNOSIS — K269 Duodenal ulcer, unspecified as acute or chronic, without hemorrhage or perforation: Secondary | ICD-10-CM

## 2023-02-20 DIAGNOSIS — K529 Noninfective gastroenteritis and colitis, unspecified: Secondary | ICD-10-CM | POA: Diagnosis not present

## 2023-02-20 DIAGNOSIS — N189 Chronic kidney disease, unspecified: Secondary | ICD-10-CM

## 2023-02-20 DIAGNOSIS — N179 Acute kidney failure, unspecified: Secondary | ICD-10-CM | POA: Diagnosis not present

## 2023-02-20 DIAGNOSIS — K921 Melena: Secondary | ICD-10-CM | POA: Diagnosis not present

## 2023-02-20 DIAGNOSIS — D62 Acute posthemorrhagic anemia: Secondary | ICD-10-CM | POA: Diagnosis not present

## 2023-02-20 DIAGNOSIS — K279 Peptic ulcer, site unspecified, unspecified as acute or chronic, without hemorrhage or perforation: Secondary | ICD-10-CM

## 2023-02-20 DIAGNOSIS — K5000 Crohn's disease of small intestine without complications: Secondary | ICD-10-CM

## 2023-02-20 LAB — GLUCOSE, CAPILLARY
Glucose-Capillary: 109 mg/dL — ABNORMAL HIGH (ref 70–99)
Glucose-Capillary: 117 mg/dL — ABNORMAL HIGH (ref 70–99)
Glucose-Capillary: 173 mg/dL — ABNORMAL HIGH (ref 70–99)
Glucose-Capillary: 175 mg/dL — ABNORMAL HIGH (ref 70–99)
Glucose-Capillary: 180 mg/dL — ABNORMAL HIGH (ref 70–99)
Glucose-Capillary: 197 mg/dL — ABNORMAL HIGH (ref 70–99)

## 2023-02-20 LAB — URINALYSIS, ROUTINE W REFLEX MICROSCOPIC
Bilirubin Urine: NEGATIVE
Glucose, UA: NEGATIVE mg/dL
Ketones, ur: NEGATIVE mg/dL
Nitrite: NEGATIVE
Protein, ur: 100 mg/dL — AB
RBC / HPF: >50 RBC/hpf (ref 0–5)
Specific Gravity, Urine: 1.014 (ref 1.005–1.030)
WBC, UA: >50 WBC/hpf (ref 0–5)
pH: 5 (ref 5.0–8.0)

## 2023-02-20 LAB — BASIC METABOLIC PANEL
Anion gap: 10 (ref 5–15)
BUN: 101 mg/dL — ABNORMAL HIGH (ref 8–23)
CO2: 20 mmol/L — ABNORMAL LOW (ref 22–32)
Calcium: 7.4 mg/dL — ABNORMAL LOW (ref 8.9–10.3)
Chloride: 103 mmol/L (ref 98–111)
Creatinine, Ser: 2.83 mg/dL — ABNORMAL HIGH (ref 0.44–1.00)
GFR, Estimated: 17 mL/min — ABNORMAL LOW (ref >60)
Glucose, Bld: 219 mg/dL — ABNORMAL HIGH (ref 70–99)
Potassium: 3.8 mmol/L (ref 3.5–5.1)
Sodium: 133 mmol/L — ABNORMAL LOW (ref 135–145)

## 2023-02-20 LAB — CBC
HCT: 24.7 % — ABNORMAL LOW (ref 36.0–46.0)
Hemoglobin: 8.2 g/dL — ABNORMAL LOW (ref 12.0–15.0)
MCH: 30.4 pg (ref 26.0–34.0)
MCHC: 33.2 g/dL (ref 30.0–36.0)
MCV: 91.5 fL (ref 80.0–100.0)
Platelets: 201 10*3/uL (ref 150–400)
RBC: 2.7 MIL/uL — ABNORMAL LOW (ref 3.87–5.11)
RDW: 16.8 % — ABNORMAL HIGH (ref 11.5–15.5)
WBC: 7.5 10*3/uL (ref 4.0–10.5)
nRBC: 0 % (ref 0.0–0.2)

## 2023-02-20 MED ORDER — MAGNESIUM SULFATE 2 GM/50ML IV SOLN
2.0000 g | Freq: Once | INTRAVENOUS | Status: AC
  Administered 2023-02-20: 2 g via INTRAVENOUS
  Filled 2023-02-20: qty 50

## 2023-02-20 NOTE — Progress Notes (Addendum)
PROGRESS NOTE    Heather [name]  YQM:[id number] DOB: Apr 20, 1951 DOA: 02/17/2023 PCP: [name], MD   Brief Narrative:    Heather [name] is a 72 y.o. female with medical history significant for diabetes mellitus, Crohn's disease, CKD 4, diastolic CHF, pulmonary hypertension, gout. Patient presented to the ED with reports of dark red stools. Per notes, patient was confused in triage.  After transfusion in ED, mental status improved.  Patient was admitted with symptomatic acute blood loss anemia in the setting of active GI bleed.  She has had 3 unit PRBC transfusion with improvement in hemoglobin levels and underwent GI evaluation with EGD and flex sigmoidoscopy on 4/23 with findings of esophageal ulcers and duodenal ulcers with oozing hemorrhage requiring clips.  Biopsies also taken for possible Crohn's.  GI recommending clear liquid diet and Protonix infusion for 72 hours.  Subjective: The patient was seen and examined this morning, pleasantly confused, refused her morning labs and medication but now she is cooperative... No further bleeding or drop in H&H overnight Hemodynamically stable    Assessment & Plan:   Principal Problem:   GI bleed Active Problems:   Acute renal failure superimposed on stage 4 chronic kidney disease   Acute metabolic encephalopathy   Acute on chronic anemia   Pancreatic lesion   Proctocolitis   Hypoalbuminemia   Diabetes mellitus   Essential hypertension   UTI (urinary tract infection)   Gout   Visual disturbance   Cerebrovascular accident (CVA) due to embolism of cerebral artery   Rectal bleeding   ABLA (acute blood loss anemia)  Assessment and Plan:  Acute symptomatic blood loss anemia secondary to GI bleed With acute on chronic anemia.  Hgb down to 3.2, recent hemoglobin/14 on discharge from the hospital 8.2, she received 1 unit PRBC for hemoglobin of 6.7.  Reports bloody stools over the past 3 days, no hematemesis.  Positive NSAID  use history. Not on anticoagulation.  10/2020, capsule endoscopy-several small aphthous appearing ulcers scattered in small bowel,-mild IBD/Crohn's. Colonoscopy and EGD 2021 were unremarkable. -Status post 3 unit PRBC transfusion -Underwent EGD and flex sigmoidoscopy 4/23 with recommendations to remain on PPI drip for total 72 hours as well as clear liquid diet >>>       Latest Ref Rng & Units 02/20/2023   11:25 AM 02/19/2023    2:55 AM 02/18/2023    6:34 AM  CBC  WBC 4.0 - 10.5 K/uL 7.5  9.8  13.6   Hemoglobin 12.0 - 15.0 g/dL 8.2  8.4  9.5   Hematocrit 36.0 - 46.0 % 24.7  24.7  27.6   Platelets 150 - 400 K/uL 201  208  249      Proctocolitis -Stable Presenting with bloody stools, history of Crohn's.  Leukocytosis of 17.2.  ESR elevated at >130.  Serum ferritin 1462.  Rules out for sepsis.  Denies pain with bowel movements.  CT abdomen and pelvis without contrast-  Mild bowel wall thickening and mucosal enhancement involving portions of the sigmoid colon and rectum may reflect proctocolitis. -IV ceftriaxone and metronidazole for now -Follow-up GI recs  -Reports dysuria, follow-up UA   Pancreatic lesion Incidental finding on CT today 02/17/2023-  Low-density pancreatic lesion. When the patient is clinically stable and able to follow directions and hold their breath (preferably as an outpatient) further evaluation with dedicated abdominal MRI could be considered for further characterization. -Follow-up as outpatient   Acute metabolic encephalopathy Mentation much improved at baseline, pleasantly confused (likely at  baseline) - mental status is improved after 1 unit PRBC.  Encephalopathy likely due to severe anemia, ??  Proctocolitis.  She has a leukocytosis of 17.2.  Also reports dysuria. - Monitor for now, defer to brain imaging for now -Resume home Keppra, venlafaxine - Follow-up UA.   Acute renal failure superimposed on stage 4 chronic kidney disease AKI on CKD stage IV.  Cr- 3.26,  last discharge creatinine 4/14- 1.8. In the setting of severe anemia GI bleed.  -Continue daily oral bicarbonate and administer IV fluid for now while NPO   Hypoalbuminemia Albumin 1.6.  Calcium 7.5, corrected calcium - 9.4.   Diabetes mellitus - HgbA1c 6.4 - SSI-    Essential hypertension Initial unreliable blood pressures.  Arterial line placed in ED. -Will use as needed IV labetalol 5mg  greater than 180 -Hold home antihypertensives metoprolol 37.5mg     Urinary tract infection  -Symptomatic with dysuria -UA revealed large leukocyte esterase, >50 WBCs, will follow-up with cultures -Current antibiotics of Rocephin  Gout Resume allopurinol    DVT prophylaxis: SCDs Code Status: Full Family Communication: None at bedside Disposition Plan: GI evaluation Status is: Inpatient Remains inpatient appropriate because: Need for IV medications.  Consultants:  GI  Procedures:  EGD and flex sigmoidoscopy 4/23  Antimicrobials:  Anti-infectives (From admission, onward)    Start     Dose/Rate Route Frequency Ordered Stop   02/17/23 2200  cefTRIAXone (ROCEPHIN) 2 g in sodium chloride 0.9 % 100 mL IVPB        2 g 200 mL/hr over 30 Minutes Intravenous Every 24 hours 02/17/23 2155     02/17/23 2200  metroNIDAZOLE (FLAGYL) IVPB 500 mg        500 mg 100 mL/hr over 60 Minutes Intravenous Every 12 hours 02/17/23 2155          Objective: Vitals:   02/20/23 0730 02/20/23 1000 02/20/23 1100 02/20/23 1220  BP:  121/66  (!) 147/62  Pulse: 98 (!) 106 (!) 106 (!) 106  Resp: 12 (!) 28 15   Temp: 98.2 F (36.8 C)  98.3 F (36.8 C) 98.3 F (36.8 C)  TempSrc: Oral  Oral Oral  SpO2: 100% 99% 100% 100%  Weight:      Height:        Intake/Output Summary (Last 24 hours) at 02/20/2023 1525 Last data filed at 02/20/2023 1300 Gross per 24 hour  Intake 796.35 ml  Output 350 ml  Net 446.35 ml   Filed Weights   02/18/23 0500 02/19/23 0951 02/20/23 0504  Weight: 55 kg 55 kg 55.9 kg         General:  Pleasantly confused AAO x 1,  cooperative, no distress;   HEENT:  Normocephalic, PERRL, otherwise with in Normal limits   Neuro:  CNII-XII intact. , normal motor and sensation, reflexes intact   Lungs:   Clear to auscultation BL, Respirations unlabored,  No wheezes / crackles  Cardio:    S1/S2, RRR, No murmure, No Rubs or Gallops   Abdomen:  Soft, non-tender, bowel sounds active all four quadrants, no guarding or peritoneal signs.  Muscular  skeletal:  Limited exam - sever global generalized weaknesses - in bed, able to move all 4 extremities,   2+ pulses,  symmetric, No pitting edema  Skin:  Dry, warm to touch, negative for any Rashes,  Wounds: Please see nursing documentation  Pressure Injury 02/19/23 Buttocks Right Stage 2 -  Partial thickness loss of dermis presenting as a shallow open injury with a  red, pink wound bed without slough. 1 x 0.8 (Active)  02/19/23 0500  Location: Buttocks  Location Orientation: Right  Staging: Stage 2 -  Partial thickness loss of dermis presenting as a shallow open injury with a red, pink wound bed without slough.  Wound Description (Comments): 1 x 0.8  Present on Admission: Yes (as per admitting nurse)  Dressing Type Foam - Lift dressing to assess site every shift 02/20/23 1000     Pressure Injury 02/19/23 Buttocks Left Stage 2 -  Partial thickness loss of dermis presenting as a shallow open injury with a red, pink wound bed without slough. 1.5 x 1 (Active)  02/19/23 0500  Location: Buttocks  Location Orientation: Left  Staging: Stage 2 -  Partial thickness loss of dermis presenting as a shallow open injury with a red, pink wound bed without slough.  Wound Description (Comments): 1.5 x 1  Present on Admission: Yes (as per admitting nurse)  Dressing Type Foam - Lift dressing to assess site every shift 02/20/23 1000          Data Reviewed: I have personally reviewed following labs and imaging studies  CBC: Recent Labs   Lab 02/17/23 1625 02/17/23 1735 02/18/23 0634 02/19/23 0255 02/20/23 1125  WBC 17.2* 16.6* 13.6* 9.8 7.5  NEUTROABS  --  14.4*  --   --   --   HGB 3.2* 3.8* 9.5* 8.4* 8.2*  HCT 10.3* 12.6* 27.6* 24.7* 24.7*  MCV 101.0* 102.4* 86.8 88.5 91.5  PLT 348 367 249 208 201   Basic Metabolic Panel: Recent Labs  Lab 02/17/23 1604 02/18/23 0634 02/19/23 0255 02/20/23 1125  NA 135 135 134* 133*  K 4.6 4.4 4.0 3.8  CL 100 102 102 103  CO2 21* 21* 22 20*  GLUCOSE 146* 132* 75 219*  BUN 115* 115* 112* 101*  CREATININE 3.26* 3.13* 3.27* 2.83*  CALCIUM 7.5* 7.4* 7.0* 7.4*  MG  --   --  1.3*  --    GFR: Estimated Creatinine Clearance: 16 mL/min (A) (by C-G formula based on SCr of 2.83 mg/dL (H)). Liver Function Tests: Recent Labs  Lab 02/17/23 1604  AST 25  ALT 11  ALKPHOS 94  BILITOT 0.6  PROT 4.3*  ALBUMIN 1.6*   Recent Labs  Lab 02/17/23 1735  LIPASE 21    HbA1C: Recent Labs    02/18/23 0634  HGBA1C 6.4*   CBG: Recent Labs  Lab 02/19/23 1957 02/20/23 0000 02/20/23 0400 02/20/23 0758 02/20/23 1205  GLUCAP 201* 180* 117* 109* 197*   Lipid Profile: No results for input(s): "CHOL", "HDL", "LDLCALC", "TRIG", "CHOLHDL", "LDLDIRECT" in the last 72 hours. Thyroid Function Tests: No results for input(s): "TSH", "T4TOTAL", "FREET4", "T3FREE", "THYROIDAB" in the last 72 hours. Anemia Panel: Recent Labs    02/17/23 1735  FERRITIN 1,462*  TIBC 135*  IRON 51   Sepsis Labs: No results for input(s): "PROCALCITON", "LATICACIDVEN" in the last 168 hours.  Recent Results (from the past 240 hour(s))  MRSA Next Gen by PCR, Nasal     Status: Abnormal   Collection Time: 02/17/23  7:35 PM   Specimen: Nasal Mucosa; Nasal Swab  Result Value Ref Range Status   MRSA by PCR Next Gen DETECTED (A) NOT DETECTED Final    Comment: RESULT CALLED TO, READ BACK BY AND VERIFIED WITH: KING J @ 0747 ON [id number] BY HENDERSON L (NOTE) The GeneXpert MRSA Assay (FDA approved for NASAL  specimens only), is one component of a comprehensive MRSA colonization  surveillance program. It is not intended to diagnose MRSA infection nor to guide or monitor treatment for MRSA infections. Test performance is not FDA approved in patients less than 71 years old. Performed at [facility], [address] [place], [place] [address]          Radiology Studies: No results found.      Scheduled Meds:  (feeding supplement) PROSource Plus  30 mL Oral TID BM   sodium chloride   Intravenous Once   allopurinol  100 mg Oral Daily   atorvastatin  80 mg Oral QHS   Chlorhexidine Gluconate Cloth  6 each Topical Q0600   insulin aspart  0-6 Units Subcutaneous Q4H   levETIRAcetam  500 mg Oral BID   lipase/protease/amylase  36,000 Units Oral TID WC   multivitamin-lutein  1 capsule Oral Daily   mupirocin ointment   Nasal BID   [START ON 02/23/2023] pantoprazole  40 mg Intravenous Q12H   sodium bicarbonate  650 mg Oral Daily   venlafaxine XR  150 mg Oral Daily   Continuous Infusions:  cefTRIAXone (ROCEPHIN)  IV Stopped (02/19/23 2141)   metronidazole 100 mL/hr at 02/20/23 1026   pantoprazole 8 mg/hr (02/20/23 1439)     LOS: 3 days    Time spent: 35 minutes    Kendell Bane, MD  [facility]  If 7PM-7AM, please contact night-coverage [email or web address] 02/20/2023, 3:25 PM

## 2023-02-20 NOTE — Progress Notes (Signed)
   02/19/23 1400  Spiritual Encounters  Type of Visit Follow up  Care provided to: [facility] partners present during encounter Nurse  Referral source IDT Rounds  Reason for visit Routine spiritual support  OnCall Visit No  Spiritual Framework  Presenting Themes [name] hope and growth;Goals in life/care  Community/Connection Family  Patient Stress Factors Loss  Family Stress Factors Loss  Interventions  Spiritual Care Interventions Made Compassionate presence;Normalization of emotions;Narrative/life review;Prayer  Intervention Outcomes  Outcomes Connection to spiritual care;Awareness of support  Spiritual Care Plan  Spiritual Care Issues Still Outstanding Chaplain will continue to follow   Found patient husband bedside today. Patient was asleep and recovering from Endoscopy earlier in the day. Husband welcomed Chaplain bedside and spoke about their life together and engaged well in reflection today around [name] illness. He was grateful for Chaplain visit and Chaplain closed in prayer. Will continue to visit in order to provide spiritual support and to assess for spiritual need.   Rev. Jolyn Lent, M.Div Chaplain

## 2023-02-20 NOTE — Progress Notes (Signed)
Subjective: Patient denies abdominal pain, nausea, vomiting. Unsure of last BM. Notes taking 2-3 ibuprofen tablets daily at home for about 1 month. She denies any presence of diarrhea. She is hungry but states she did not eat the food because she did not like it.   Patient has been refusing labs. I discussed importance of obtaining repeat lab work with the patient to monitor her hemoglobin, she is amenable to allowing blood draw at this time.   Objective: Vital signs in last 24 hours: Temp:  [97.6 F (36.4 C)-98.7 F (37.1 C)] 98.2 F (36.8 C) (04/24 0730) Pulse Rate:  [70-109] 98 (04/24 0730) Resp:  [8-24] 12 (04/24 0730) BP: (99-164)/(39-144) 147/54 (04/24 0600) SpO2:  [98 %-100 %] 100 % (04/24 0730) Weight:  [55 kg-55.9 kg] 55.9 kg (04/24 0504) Last BM Date : 02/17/23 General:   Alert and oriented, frustrated. Head:  Normocephalic and atraumatic. Eyes:  No icterus, sclera clear. Conjuctiva pink.  Mouth:  Without lesions, mucosa pink and moist.  Heart:  S1, S2 present, no murmurs noted.  Lungs: Clear to auscultation bilaterally, without wheezing, rales, or rhonchi.  Abdomen:  Bowel sounds present, soft, non-tender, non-distended. No HSM or hernias noted. No rebound or guarding. No masses appreciated  Msk:  Symmetrical without gross deformities. Normal posture. Pulses:  Normal pulses noted. Extremities:  Without clubbing or edema. Neurologic:  Alert and  oriented x4;  grossly normal neurologically. Skin:  Warm and dry, intact without significant lesions.  Psych:  Alert and cooperative. Normal mood and affect.  Intake/Output from previous day: 04/23 0701 - 04/24 0700 In: 810.4 [I.V.:608.7; IV Piggyback:201.6] Out: 600 [Urine:600]   Lab Results: Recent Labs    02/17/23 1735 02/18/23 0634 02/19/23 0255  WBC 16.6* 13.6* 9.8  HGB 3.8* 9.5* 8.4*  HCT 12.6* 27.6* 24.7*  PLT 367 249 208   BMET Recent Labs    02/17/23 1604 02/18/23 0634 02/19/23 0255  NA 135 135 134*   K 4.6 4.4 4.0  CL 100 102 102  CO2 21* 21* 22  GLUCOSE 146* 132* 75  BUN 115* 115* 112*  CREATININE 3.26* 3.13* 3.27*  CALCIUM 7.5* 7.4* 7.0*   LFT Recent Labs    02/17/23 1604  PROT 4.3*  ALBUMIN 1.6*  AST 25  ALT 11  ALKPHOS 94  BILITOT 0.6   Assessment: 72 y.o. female with history of small bowel Crohn's disease diagnosed in Jan 2022 and previously on Humira, history of pancreatitis possibly related to hypertriglyceridemia and use of Victoza, CKD, depression, diabetes, GERD, anxiety, IBS, hyperlipidemia, stroke, presenting to the ED Sunday with reports of dark red rectal bleeding per husband and found to have profound anemia with Hgb 3.2.Atmitted with acute symptomatic blood loss anemia in setting of GI bleed, proctocolitis, acute renal failure on CKD, acute metabolic encephalopathy but improved following blood transfusion.   Anemia: hgb 3.2 on admission, 8.4 yesterday. Staff attempting repeat labs this morning, however patient refusing. I discussed importance of repeat labs with the patient to monitor for ongoing blood loss. She is amenable to allowing her blood to be drawn. she reported dark red blood in stool prior to admission, no overt GI bleeding since being hospitalized. Received 3 units PRBCs. Using NSAIDs frequently at home. Underwent EGD and flex sig yesterday with many cratered esophageal ulcers, severe esophagitis, multiple non obstructing oozing cratered duodenal ulcers with oozing hemorrhage and associated clots in duodenum hemostatic clip placed, injected with epi for hemostasis. Was supposed to undergo colonoscopy which was changed  to only Flex sig in light of large amount of stool present that was melanotic. Query UGI crohn's/superimposed NSAID use on first appearance, path most consistent with NSAID induced ulcers, no evidence of UGI crohn's disease. Patient counseled on avoidance of NSAID use.   Crohn's disease: capsule Jan 2022 with several small aphthous ulcer  throughout small bowel. EGD and Colonoscopy normal in 2021. Normal examination of TI and negative colonic biopsies. CT without contrast sunday w/ mild bowel wall thickening and mucosal enhancement involving portions of sigmoid and rectum, possible proctocolitis. Again, no further bleeding since admission and no diarrhea reported. It does not appear that she has been on Humira, which was initially prescribed in 2022. Historically, steroid therapy has been judicious in light of diabetes. Flex sig yesterday with presence of melanotic stool. Will need further Crohn's evaluation/management as outpatient.    Plan: Clear liquid diet Pantoprazole drip x72 hours Trend h&h, transfuse for hgb <7 Monitor for overt GI bleeding Possible Repeat EGD if GI bleeding recurs  Avoid all NSAIDs Further Outpatient management of Crohn's disease    LOS: 3 days    02/20/2023, 9:43 AM   Renlee Floor L. Jeanmarie Hubert, MSN, APRN, AGNP-C Adult-Gerontology Nurse Practitioner [facility]

## 2023-02-20 NOTE — Hospital Course (Addendum)
Brief Narrative:    Heather [name] is a 72 y.o. female with medical history significant for diabetes mellitus, Crohn's disease, CKD 4, diastolic CHF, pulmonary hypertension, gout. Patient presented to the ED with reports of dark red stools. Per notes, patient was confused in triage.  After transfusion in ED, mental status improved.  Patient was admitted with symptomatic acute blood loss anemia in the setting of active GI bleed.  She has had 3 unit PRBC transfusion with improvement in hemoglobin levels and underwent GI evaluation with EGD and flex sigmoidoscopy on 4/23 with findings of esophageal ulcers and duodenal ulcers with oozing hemorrhage requiring clips.  Biopsies also taken for possible Crohn's.  GI recommending clear liquid diet and Protonix infusion for 72 hours.   Assessment & Plan:   Principal Problem:   GI bleed Active Problems:   Acute renal failure superimposed on stage 4 chronic kidney disease   Acute metabolic encephalopathy   Acute on chronic anemia   Pancreatic lesion   Proctocolitis   Hypoalbuminemia   Diabetes mellitus   Essential hypertension   Gout   Cerebrovascular accident (CVA) due to embolism of cerebral artery   Assessment and Plan:   Acute symptomatic blood loss anemia secondary to GI bleed With acute on chronic anemia.  Hgb down to 3.2, recent hemoglobin/14 on discharge from the hospital 8.2, she received 1 unit PRBC for hemoglobin of 6.7.  Reports bloody stools over the past 3 days, no hematemesis.  Positive NSAID use history. Not on anticoagulation.  10/2020, capsule endoscopy-several small aphthous appearing ulcers scattered in small bowel,-mild IBD/Crohn's. Colonoscopy and EGD 2021 were unremarkable. -Status post 3 unit PRBC transfusion now  -Underwent EGD and flex sigmoidoscopy 4/23 with recommendations to remain on PPI drip for 72 hours as well as clear liquid diet     Latest Ref Rng & Units 02/19/2023    2:55 AM 02/18/2023    6:34 AM 02/17/2023     5:35 PM  CBC  WBC 4.0 - 10.5 K/uL 9.8  13.6  16.6   Hemoglobin 12.0 - 15.0 g/dL 8.4  9.5  3.8   Hematocrit 36.0 - 46.0 % 24.7  27.6  12.6   Platelets 150 - 400 K/uL 208  249  367       Proctocolitis Presenting with bloody stools, history of Crohn's.  Leukocytosis of 17.2.  ESR elevated at >130.  Serum ferritin 1462.  Rules out for sepsis.  Denies pain with bowel movements.  CT abdomen and pelvis without contrast-  Mild bowel wall thickening and mucosal enhancement involving portions of the sigmoid colon and rectum may reflect proctocolitis. -IV ceftriaxone and metronidazole for now -Follow-up GI recs  -Reports dysuria, follow-up UA   Pancreatic lesion Incidental finding on CT today 02/17/2023-  Low-density pancreatic lesion. When the patient is clinically stable and able to follow directions and hold their breath (preferably as an outpatient) further evaluation with dedicated abdominal MRI could be considered for further characterization. -Follow-up as outpatient   Acute metabolic encephalopathy Patient initially confused in triage, mental status is improved after 1 unit PRBC.  Encephalopathy likely due to severe anemia, ??  Proctocolitis.  She has a leukocytosis of 17.2.  Also reports dysuria. - Monitor for now, defer to brain imaging for now -Resume home Keppra, venlafaxine - Follow-up UA.   Acute renal failure superimposed on stage 4 chronic kidney disease AKI on CKD stage IV.  Cr- 3.26, last discharge creatinine 4/14- 1.8. In the setting of severe anemia GI  bleed.  -Continue daily oral bicarbonate and administer IV fluid for now while NPO   Hypoalbuminemia Albumin 1.6.  Calcium 7.5, corrected calcium - 9.4.   Diabetes mellitus - HgbA1c pending - SSI- S q4h   Essential hypertension Initial unreliable blood pressures.  Arterial line placed in ED. -Will use as needed IV labetalol  greater than 180 -Hold home antihypertensives metoprolol 37.5mg     Urinary tract infection   -Symptomatic with dysuria -UA revealed large leukocyte esterase, >50 WBCs, will follow-up with cultures -Current antibiotics of Rocephin   Gout Resume allopurinol      Consultants:  GI   Procedures:  EGD and flex sigmoidoscopy 4/23

## 2023-02-20 NOTE — Progress Notes (Signed)
This nurse and NT attempted to get pt up to ambulate and pt was unable to stand up. Pt was unable to hold herself up.

## 2023-02-21 DIAGNOSIS — K8689 Other specified diseases of pancreas: Secondary | ICD-10-CM | POA: Diagnosis not present

## 2023-02-21 DIAGNOSIS — K50918 Crohn's disease, unspecified, with other complication: Secondary | ICD-10-CM | POA: Diagnosis not present

## 2023-02-21 DIAGNOSIS — D62 Acute posthemorrhagic anemia: Secondary | ICD-10-CM | POA: Diagnosis not present

## 2023-02-21 DIAGNOSIS — K625 Hemorrhage of anus and rectum: Secondary | ICD-10-CM | POA: Diagnosis not present

## 2023-02-21 DIAGNOSIS — K921 Melena: Secondary | ICD-10-CM | POA: Diagnosis not present

## 2023-02-21 LAB — CBC
HCT: 24 % — ABNORMAL LOW (ref 36.0–46.0)
Hemoglobin: 7.7 g/dL — ABNORMAL LOW (ref 12.0–15.0)
MCH: 30.1 pg (ref 26.0–34.0)
MCHC: 32.1 g/dL (ref 30.0–36.0)
MCV: 93.8 fL (ref 80.0–100.0)
Platelets: 177 10*3/uL (ref 150–400)
RBC: 2.56 MIL/uL — ABNORMAL LOW (ref 3.87–5.11)
RDW: 17.2 % — ABNORMAL HIGH (ref 11.5–15.5)
WBC: 4.9 10*3/uL (ref 4.0–10.5)
nRBC: 0 % (ref 0.0–0.2)

## 2023-02-21 LAB — URINE CULTURE: Culture: <10000 — AB

## 2023-02-21 LAB — BPAM RBC
Blood Product Expiration Date: 202405232359
Blood Product Expiration Date: 202405232359
ISSUE DATE / TIME: 202404211710
Unit Type and Rh: 5100
Unit Type and Rh: 5100

## 2023-02-21 LAB — BASIC METABOLIC PANEL
Anion gap: 9 (ref 5–15)
BUN: 100 mg/dL — ABNORMAL HIGH (ref 8–23)
CO2: 20 mmol/L — ABNORMAL LOW (ref 22–32)
Calcium: 7.7 mg/dL — ABNORMAL LOW (ref 8.9–10.3)
Chloride: 105 mmol/L (ref 98–111)
Creatinine, Ser: 2.75 mg/dL — ABNORMAL HIGH (ref 0.44–1.00)
GFR, Estimated: 18 mL/min — ABNORMAL LOW (ref >60)
Glucose, Bld: 175 mg/dL — ABNORMAL HIGH (ref 70–99)
Potassium: 3.9 mmol/L (ref 3.5–5.1)
Sodium: 134 mmol/L — ABNORMAL LOW (ref 135–145)

## 2023-02-21 LAB — GLUCOSE, CAPILLARY
Glucose-Capillary: 123 mg/dL — ABNORMAL HIGH (ref 70–99)
Glucose-Capillary: 149 mg/dL — ABNORMAL HIGH (ref 70–99)
Glucose-Capillary: 149 mg/dL — ABNORMAL HIGH (ref 70–99)
Glucose-Capillary: 150 mg/dL — ABNORMAL HIGH (ref 70–99)
Glucose-Capillary: 156 mg/dL — ABNORMAL HIGH (ref 70–99)
Glucose-Capillary: 171 mg/dL — ABNORMAL HIGH (ref 70–99)
Glucose-Capillary: 207 mg/dL — ABNORMAL HIGH (ref 70–99)

## 2023-02-21 LAB — SURGICAL PATHOLOGY

## 2023-02-21 LAB — TYPE AND SCREEN
Antibody Screen: NEGATIVE
Unit division: 0
Unit division: 0
Unit division: 0
Unit division: 0

## 2023-02-21 LAB — HEMOGLOBIN AND HEMATOCRIT, BLOOD
HCT: 26.4 % — ABNORMAL LOW (ref 36.0–46.0)
Hemoglobin: 8.4 g/dL — ABNORMAL LOW (ref 12.0–15.0)

## 2023-02-21 MED ORDER — METRONIDAZOLE 500 MG PO TABS
500.0000 mg | ORAL_TABLET | Freq: Three times a day (TID) | ORAL | Status: DC
  Administered 2023-02-21 – 2023-02-25 (×11): 500 mg via ORAL
  Filled 2023-02-21 (×14): qty 1

## 2023-02-21 MED ORDER — RISAQUAD PO CAPS
1.0000 | ORAL_CAPSULE | Freq: Three times a day (TID) | ORAL | Status: DC
  Administered 2023-02-21 – 2023-02-25 (×8): 1 via ORAL
  Filled 2023-02-21 (×12): qty 1

## 2023-02-21 NOTE — Progress Notes (Signed)
PROGRESS NOTE    Heather [name]  WUJ:[id number] DOB: [birth year] DOA: 02/17/2023 PCP: [name], MD   Brief Narrative:    Heather [name] is a 72 y.o. female with medical history significant for diabetes mellitus, Crohn's disease, CKD 4, diastolic CHF, pulmonary hypertension, gout. Patient presented to the ED with reports of dark red stools. Per notes, patient was confused in triage.  After transfusion in ED, mental status improved.  Patient was admitted with symptomatic acute blood loss anemia in the setting of active GI bleed.  She has had 3 unit PRBC transfusion with improvement in hemoglobin levels and underwent GI evaluation with EGD and flex sigmoidoscopy on 4/23 with findings of esophageal ulcers and duodenal ulcers with oozing hemorrhage requiring clips.  Biopsies also taken for possible Crohn's.  GI recommending clear liquid diet and Protonix infusion for 72 hours.  Subjective: The patient was seen and examined this morning, stable no acute distress requesting to be discharged home. Nursing staff reported bloody bowel movement this morning H&H dropped a bit to 7.7 from 8.2    Assessment & Plan:   Principal Problem:   GI bleed Active Problems:   Acute renal failure superimposed on stage 4 chronic kidney disease   Acute metabolic encephalopathy   Acute on chronic anemia   Pancreatic lesion   Proctocolitis   Hypoalbuminemia   Diabetes mellitus   Essential hypertension   UTI (urinary tract infection)   Gout   Visual disturbance   Cerebrovascular accident (CVA) due to embolism of cerebral artery   Rectal bleeding   ABLA (acute blood loss anemia)   PUD (peptic ulcer disease)  Assessment and Plan:  Acute symptomatic blood loss anemia secondary to GI bleed -With acute on chronic anemia.  Hgb was down to 3.2, recent hemoglobin/14 on discharge from the hospital 8.2, she received 1 unit PRBC for hemoglobin of 6.7.  Reported bloody stools over past 3 days prior to  hospitalization, no hematemesis.   - Positive NSAID use history. Not on anticoagulation.   - 10/2020, capsule endoscopy-several small aphthous appearing ulcers scattered in small bowel,-mild IBD/Crohn's.  -Colonoscopy and EGD 2021 were unremarkable. -Status post 3 unit PRBC transfusion on this admission  -Underwent EGD and flex sigmoidoscopy 02/19/2023  with recommendations to remain on PPI drip for total 72 hours as well as clear liquid diet >>>       Latest Ref Rng & Units 02/21/2023    5:57 AM 02/20/2023   11:25 AM 02/19/2023    2:55 AM  CBC  WBC 4.0 - 10.5 K/uL 4.9  7.5  9.8   Hemoglobin 12.0 - 15.0 g/dL 7.7  8.2  8.4   Hematocrit 36.0 - 46.0 % 24.0  24.7  24.7   Platelets 150 - 400 K/uL 177  201  208    Per nursing staff bloody bowel movement this a.m., rechecking H&H   Proctocolitis -Stable Presenting with bloody stools, history of Crohn's.  Leukocytosis of 17.2.  ESR elevated at >130.  Serum ferritin 1462.  Rules out for sepsis.   - CT abdomen and pelvis without contrast - Mild bowel wall thickening and mucosal enhancement involving portions of the sigmoid colon and rectum may reflect proctocolitis. -Was on IV ceftriaxone and metronidazole --- anticipating switching antibiotics to p.o.  (Listed allergies and intolerances to sulfa and quinolones Abxs)  -Follow-up GI recs  -Reports dysuria, follow-up UA   Pancreatic lesion Incidental finding on CT today 02/17/2023-  Low-density pancreatic lesion. When the patient is  clinically stable and able to follow directions and hold their breath (preferably as an outpatient) further evaluation with dedicated abdominal MRI could be considered for further characterization. -Follow-up as outpatient   Acute metabolic encephalopathy Mentation much improved -at baseline a AAO x 3 now   - mental status is improved after PRBC.  Encephalopathy likely due to severe anemia, Proctocolitis. UTI  -Resume home Keppra, venlafaxine    Acute renal  failure superimposed on stage 4 chronic kidney disease AKI on CKD stage IV.  Cr- 3.26, last discharge creatinine 4/14- 1.8. In the setting of severe anemia GI bleed.  -Continue daily oral bicarbonate/ IVF  Lab Results  Component Value Date   CREATININE 2.75 (H) 02/21/2023   CREATININE 2.83 (H) 02/20/2023   CREATININE 3.27 (H) 02/19/2023       Hypoalbuminemia Albumin 1.6.  Calcium 7.5, corrected calcium - 9.4.   Diabetes mellitus - HgbA1c 6.4 - SSI-    Essential hypertension -BP trending low  -Will use as needed IV labetalol 5mg  SBP>  180 -Hold home antihypertensives metoprolol 37.5mg     Urinary tract infection  -Symptomatic with dysuria -UA revealed large leukocyte esterase, >50 WBCs, will follow-up with cultures -Current antibiotics of Rocephin   Gout Resume allopurinol    DVT prophylaxis: SCDs Code Status: Full Family Communication: None at bedside Disposition Plan: GI evaluation Status is: Inpatient Remains inpatient appropriate because: Need for IV medications.  Consultants:  GI  Procedures:  EGD and flex sigmoidoscopy 4/23  Antimicrobials:  Anti-infectives (From admission, onward)    Start     Dose/Rate Route Frequency Ordered Stop   02/17/23 2200  cefTRIAXone (ROCEPHIN) 2 g in sodium chloride 0.9 % 100 mL IVPB        2 g 200 mL/hr over 30 Minutes Intravenous Every 24 hours 02/17/23 2155     02/17/23 2200  metroNIDAZOLE (FLAGYL) IVPB 500 mg        500 mg 100 mL/hr over 60 Minutes Intravenous Every 12 hours 02/17/23 2155          Objective: Vitals:   02/20/23 1500 02/20/23 1948 02/21/23 0015 02/21/23 0416  BP: (!) 123/59 (!) 122/48 (!) 144/66 93/79  Pulse: (!) 101 (!) 101 94   Resp:  18 19 20   Temp:  98.3 F (36.8 C) 97.7 F (36.5 C) 97.7 F (36.5 C)  TempSrc:  Oral Oral Oral  SpO2: 99% 100% 100% 100%  Weight:      Height:        Intake/Output Summary (Last 24 hours) at 02/21/2023 1042 Last data filed at 02/21/2023 0838 Gross per 24  hour  Intake 590 ml  Output --  Net 590 ml   Filed Weights   02/18/23 0500 02/19/23 0951 02/20/23 0504  Weight: 55 kg 55 kg 55.9 kg          General:  AAO x 3,  cooperative, no distress;   HEENT:  Normocephalic, PERRL, otherwise with in Normal limits   Neuro:  CNII-XII intact. , normal motor and sensation, reflexes intact   Lungs:   Clear to auscultation BL, Respirations unlabored,  No wheezes / crackles  Cardio:    S1/S2, RRR, No murmure, No Rubs or Gallops   Abdomen:  Soft, non-tender, bowel sounds active all four quadrants, no guarding or peritoneal signs.  Muscular  skeletal:  Limited exam -global generalized weaknesses - in bed, able to move all 4 extremities,   2+ pulses,  symmetric, No pitting edema  Skin:  Dry, warm  to touch, negative for any Rashes,  Wounds: Please see nursing documentation  Pressure Injury 02/19/23 Buttocks Right Stage 2 -  Partial thickness loss of dermis presenting as a shallow open injury with a red, pink wound bed without slough. 1 x 0.8 (Active)  02/19/23 0500  Location: Buttocks  Location Orientation: Right  Staging: Stage 2 -  Partial thickness loss of dermis presenting as a shallow open injury with a red, pink wound bed without slough.  Wound Description (Comments): 1 x 0.8  Present on Admission: Yes (as per admitting nurse)  Dressing Type Foam - Lift dressing to assess site every shift 02/20/23 1000     Pressure Injury 02/19/23 Buttocks Left Stage 2 -  Partial thickness loss of dermis presenting as a shallow open injury with a red, pink wound bed without slough. 1.5 x 1 (Active)  02/19/23 0500  Location: Buttocks  Location Orientation: Left  Staging: Stage 2 -  Partial thickness loss of dermis presenting as a shallow open injury with a red, pink wound bed without slough.  Wound Description (Comments): 1.5 x 1  Present on Admission: Yes (as per admitting nurse)  Dressing Type Foam - Lift dressing to assess site every shift 02/20/23 1000               Data Reviewed: I have personally reviewed following labs and imaging studies  CBC: Recent Labs  Lab 02/17/23 1735 02/18/23 0634 02/19/23 0255 02/20/23 1125 02/21/23 0557  WBC 16.6* 13.6* 9.8 7.5 4.9  NEUTROABS 14.4*  --   --   --   --   HGB 3.8* 9.5* 8.4* 8.2* 7.7*  HCT 12.6* 27.6* 24.7* 24.7* 24.0*  MCV 102.4* 86.8 88.5 91.5 93.8  PLT 367 249 208 201 177   Basic Metabolic Panel: Recent Labs  Lab 02/17/23 1604 02/18/23 0634 02/19/23 0255 02/20/23 1125 02/21/23 0557  NA 135 135 134* 133* 134*  K 4.6 4.4 4.0 3.8 3.9  CL 100 102 102 103 105  CO2 21* 21* 22 20* 20*  GLUCOSE 146* 132* 75 219* 175*  BUN 115* 115* 112* 101* 100*  CREATININE 3.26* 3.13* 3.27* 2.83* 2.75*  CALCIUM 7.5* 7.4* 7.0* 7.4* 7.7*  MG  --   --  1.3*  --   --    GFR: Estimated Creatinine Clearance: 16.4 mL/min (A) (by C-G formula based on SCr of 2.75 mg/dL (H)). Liver Function Tests: Recent Labs  Lab 02/17/23 1604  AST 25  ALT 11  ALKPHOS 94  BILITOT 0.6  PROT 4.3*  ALBUMIN 1.6*   Recent Labs  Lab 02/17/23 1735  LIPASE 21    HbA1C: No results for input(s): "HGBA1C" in the last 72 hours.  CBG: Recent Labs  Lab 02/20/23 1616 02/20/23 2010 02/21/23 0012 02/21/23 0413 02/21/23 0749  GLUCAP 173* 175* 156* 150* 171*     Recent Results (from the past 240 hour(s))  MRSA Next Gen by PCR, Nasal     Status: Abnormal   Collection Time: 02/17/23  7:35 PM   Specimen: Nasal Mucosa; Nasal Swab  Result Value Ref Range Status   MRSA by PCR Next Gen DETECTED (A) NOT DETECTED Final    Comment: RESULT CALLED TO, READ BACK BY AND VERIFIED WITH: Aura Fey @ 0747 ON [id number] BY HENDERSON L (NOTE) The GeneXpert MRSA Assay (FDA approved for NASAL specimens only), is one component of a comprehensive MRSA colonization surveillance program. It is not intended to diagnose MRSA infection nor to guide or monitor treatment  for MRSA infections. Test performance is not FDA approved in  patients less than 28 years old. Performed at [facility], [address], [place], [place] [address]          Radiology Studies: No results found.      Scheduled Meds:  (feeding supplement) PROSource Plus  30 mL Oral TID BM   sodium chloride   Intravenous Once   allopurinol  100 mg Oral Daily   atorvastatin  80 mg Oral QHS   Chlorhexidine Gluconate Cloth  6 each Topical Q0600   insulin aspart  0-6 Units Subcutaneous Q4H   levETIRAcetam  500 mg Oral BID   lipase/protease/amylase  36,000 Units Oral TID WC   multivitamin-lutein  1 capsule Oral Daily   mupirocin ointment   Nasal BID   [START ON 02/23/2023] pantoprazole  40 mg Intravenous Q12H   sodium bicarbonate  650 mg Oral Daily   venlafaxine XR  150 mg Oral Daily   Continuous Infusions:  cefTRIAXone (ROCEPHIN)  IV 2 g (02/20/23 2245)   metronidazole 500 mg (02/21/23 0912)   pantoprazole 8 mg/hr (02/21/23 0357)     LOS: 4 days    Time spent: 35 minutes    Kendell Bane, MD  [facility]  If 7PM-7AM, please contact night-coverage [email or web address] 02/21/2023, 10:42 AM

## 2023-02-21 NOTE — Care Management Important Message (Signed)
Important Message  Patient Details  Name: Heather [name] MRN: [id number] Date of Birth: June 22, 1951   Medicare Important Message Given:  Yes     Corey Harold 02/21/2023, 11:02 AM

## 2023-02-21 NOTE — Progress Notes (Addendum)
Gastroenterology Progress Note   Referring Provider: No ref. provider found Primary Care Physician:  [name], MD Primary Gastroenterologist:  Dr. Stan Head  Patient ID: Heather [name]; [id number]; 11-Jan-1951   Subjective:    Patient wants to go home. She denies abdominal pain, vomiting. This morning she had formed black stool followed by blood clot.   Objective:   Vital signs in last 24 hours: Temp:  [97.7 F (36.5 C)-98.3 F (36.8 C)] 97.7 F (36.5 C) (04/25 0416) Pulse Rate:  [94-106] 94 (04/25 0015) Resp:  [18-20] 20 (04/25 0416) BP: (93-147)/(48-79) 93/79 (04/25 0416) SpO2:  [99 %-100 %] 100 % (04/25 0416) Last BM Date : 02/17/23 General:  tearful, NAD Head:  Normocephalic and atraumatic. Eyes:  Sclera clear, no icterus.   Abdomen:  Soft, nontender and nondistended. Normal bowel sounds, without guarding, and without rebound.   Extremities:  Without clubbing, deformity or edema. Neurologic:  Alert and  oriented x4;  grossly normal neurologically. Skin:  Intact without significant lesions or rashes. Psych:  Alert and cooperative. tearful.  Intake/Output from previous day: 04/24 0701 - 04/25 0700 In: 726 [P.O.:600; I.V.:54.2; IV Piggyback:71.9] Out: -  Intake/Output this shift: Total I/O In: 350 [P.O.:350] Out: -   Lab Results: CBC Recent Labs    02/19/23 0255 02/20/23 1125 02/21/23 0557 02/21/23 1222  WBC 9.8 7.5 4.9  --   HGB 8.4* 8.2* 7.7* 8.4*  HCT 24.7* 24.7* 24.0* 26.4*  MCV 88.5 91.5 93.8  --   PLT 208 201 177  --    BMET Recent Labs    02/19/23 0255 02/20/23 1125 02/21/23 0557  NA 134* 133* 134*  K 4.0 3.8 3.9  CL 102 103 105  CO2 22 20* 20*  GLUCOSE 75 219* 175*  BUN 112* 101* 100*  CREATININE 3.27* 2.83* 2.75*  CALCIUM 7.0* 7.4* 7.7*   LFTs No results for input(s): "BILITOT", "BILIDIR", "IBILI", "ALKPHOS", "AST", "ALT", "PROT", "ALBUMIN" in the last 72 hours. No results for input(s): "LIPASE" in the last 72  hours. PT/INR No results for input(s): "LABPROT", "INR" in the last 72 hours.       Imaging Studies: CT ABDOMEN PELVIS WO CONTRAST  Result Date: 02/17/2023 CLINICAL DATA:  Concern for bowel obstruction. Patient reportedly has red stool. EXAM: CT ABDOMEN AND PELVIS WITHOUT CONTRAST TECHNIQUE: Multidetector CT imaging of the abdomen and pelvis was performed following the standard protocol without IV contrast. RADIATION DOSE REDUCTION: This exam was performed according to the departmental dose-optimization program which includes automated exposure control, adjustment of the mA and/or kV according to patient size and/or use of iterative reconstruction technique. COMPARISON:  CT abdomen pelvis dated 02/04/2023. FINDINGS: Lower chest: Moderate bilateral pleural effusions with associated atelectasis are partially imaged. The heart is mildly enlarged. The blood pool is hypoattenuating, suggestive of anemia. Hepatobiliary: No focal liver abnormality is seen. Status post cholecystectomy. No biliary dilatation. Pancreas: Atrophic. A 1.8 cm low-density lesion in the pancreatic tail appears unchanged. No pancreatic ductal dilatation or surrounding inflammatory changes. Spleen: Normal in size without focal abnormality. Adrenals/Urinary Tract: Adrenal glands are unremarkable. Bilateral punctate calcifications in both kidneys may represent nonobstructing calculi measuring up to 2 mm and/or vascular calcifications. No suspicious focal lesion or hydronephrosis on either side. Bladder is unremarkable. Stomach/Bowel: Stomach is within normal limits. The appendix is not definitely identified, however no pericecal inflammatory changes are noted to suggest acute appendicitis. There is a mild bowel wall thickening and mucosal enhancement involving portions of the sigmoid colon  and rectum. No significant surrounding inflammatory changes. No bowel obstruction. Vascular/Lymphatic: Aortic atherosclerosis. No enlarged abdominal or  pelvic lymph nodes. Reproductive: Status post hysterectomy. No adnexal masses. Other: No abdominal wall hernia or abnormality. No abdominopelvic ascites. Anasarca is noted Musculoskeletal: There has been slight progression since 02/04/2023 of a T12 compression deformity with approximately 50% height loss centrally and 3 mm retropulsion into the central canal. Fixation hardware is seen in the right femur. Fixation hardware in the lumbar spine from L1-L4. IMPRESSION: 1. Mild bowel wall thickening and mucosal enhancement involving portions of the sigmoid colon and rectum may reflect proctocolitis. No bowel obstruction. 2. Moderate bilateral pleural effusions with associated atelectasis. 3. Slight progression since 02/04/2023 of a T12 compression deformity with approximately 50% height loss centrally and 3 mm retropulsion into the central canal. 4. Low-density pancreatic lesion. When the patient is clinically stable and able to follow directions and hold their breath (preferably as an outpatient) further evaluation with dedicated abdominal MRI could be considered for further characterization. Aortic Atherosclerosis (ICD10-I70.0). Electronically Signed   By: Romona Curls M.D.   On: 02/17/2023 17:36   DG Chest Port 1 View  Result Date: 02/05/2023 CLINICAL DATA:  Placement of central venous catheter EXAM: PORTABLE CHEST 1 VIEW COMPARISON:  Previous studies including the examination done on 12/15/2022 FINDINGS: Cardiac size is within normal limits. Apparent shift of mediastinum to the right may be due to rotation. There is interval placement of central venous catheter through the right internal jugular with its tip in right atrium. There is no pleural effusion or pneumothorax. Small linear densities are seen in right lower lung field. Surgical fusion is seen in cervical spine, upper thoracic spine and lumbar spine. Surgical clips are seen in gallbladder fossa. There is deformity in right clavicle consistent with old  healed fracture. IMPRESSION: Tip of right IJ central venous catheter is seen in right atrium. There is no pneumothorax. Linear densities in left lower lung field may suggest subsegmental atelectasis or scarring. Electronically Signed   By: Ernie Avena M.D.   On: 02/05/2023 12:55   [place] EKG SITE RITE  Result Date: 02/05/2023 If Site Rite image not attached, placement could not be confirmed due to current cardiac rhythm.  CT Renal Stone Study  Result Date: 02/04/2023 CLINICAL DATA:  Abdominal and flank pain with emesis. EXAM: CT ABDOMEN AND PELVIS WITHOUT CONTRAST TECHNIQUE: Multidetector CT imaging of the abdomen and pelvis was performed following the standard protocol without IV contrast. RADIATION DOSE REDUCTION: This exam was performed according to the departmental dose-optimization program which includes automated exposure control, adjustment of the mA and/or kV according to patient size and/or use of iterative reconstruction technique. COMPARISON:  CT abdomen and pelvis 08/03/2022 FINDINGS: Lower chest: No acute abnormality. Hepatobiliary: No focal liver abnormality is seen. Status post cholecystectomy. No biliary dilatation. Pancreas: Rounded low-density area in the tail the pancreas measures 18 mm in diameter and was not seen on prior. There is no acute pancreatic inflammation or ductal dilatation. Spleen: Normal in size without focal abnormality. Adrenals/Urinary Tract: Subcentimeter cortical cyst in the left kidney is unchanged. Otherwise, the adrenal glands, kidneys and bladder are within normal limits. Stomach/Bowel: There is moderate fluid distention of the stomach. There some fluid-filled jejunal loops without definite dilatation or transition point. The colon is nondilated. There is a large amount of stool throughout the colon. The appendix is not seen. No free air or pneumatosis. Vascular/Lymphatic: There are severe atherosclerotic disease throughout the aorta and peripheral vessels. No  enlarged lymph nodes. No aortic aneurysm. Reproductive: Status post hysterectomy. No adnexal masses. Other: No abdominal wall hernia or abnormality. No abdominopelvic ascites. Musculoskeletal: L1-L4 posterior fusion hardware is present. There is stable compression fracture of T12. Right hip screw is present. Healed left inferior pubic ramus fracture. IMPRESSION: 1. Moderate fluid distention of the stomach. There are some fluid-filled jejunal loops without definite dilatation or transition point. Findings may be related to gastroenteritis. 2. Large amount of stool throughout the colon. 3. 18 mm low-density lesion in the tail of the pancreas, not seen on prior. This can be further characterized with MRI. 4. Severe atherosclerotic disease of the aorta and peripheral vessels. 5. Subcentimeter left Bosniak I benign renal cyst. No follow-up imaging is recommended. JACR 2018 Feb; 264-273, Management of the Incidental Renal Mass on CT, [employer] 2021; [id number], Bosniak Classification of Cystic Renal Masses, Version 2019. Aortic Atherosclerosis (ICD10-I70.0). Electronically Signed   By: Darliss Cheney M.D.   On: 02/04/2023 20:42  [2 weeks]  Assessment:   72 year old female with history of small bowel Crohn's disease diagnosed in January 2022 previously on Humira, history of pancreatitis possibly related to hypertriglyceridemia and use of Victoza, CKD, depression, diabetes, GERD, anxiety, IBS, hyperlipidemia, stroke, presented to the ED Sunday and reported dark red bleeding per husband and found to have profound anemia with hemoglobin of 3.2.  Admitted with acute symptomatic blood loss anemia in the setting of GI bleed, proctocolitis, acute renal failure on CKD, acute metabolic encephalopathy but improved following blood transfusion.   Anemia: hemoglobin 3.2 on admission.  She has received 3 units of packed red blood cells.  Hemoglobin went up to 9.5, down to 8.2 yesterday and 7.7 today.  Patient reported dark red  blood in the stool prior to admission.  No overt GI bleeding noted during the hospitalization until this morning, nurse tech reported quite a bit of blood in the stool. Spoke with tech, she passed formed black stool followed by blood clot. Patient reports use of NSAIDs frequently at home.  Completed EGD and flexible sigmoidoscopy this admission with many cratered esophageal ulcers, severe esophagitis, multiple nonobstructing oozing cratered duodenal ulcers with oozing hemorrhage and associated clots in the duodenum.  Hemostatic clip placed.  Injected with epi for hemostasis.  Was supposed to complete colonoscopy but it was changed to a flexible sigmoidoscopy in the light of large amount of stool present that was melanotic.  Path most consistent with NSAID induced ulcers.  Crohn's disease: capsule Jan 2022 with several small aphthous ulcer throughout small bowel. EGD and Colonoscopy normal in 2021. Normal examination of TI and negative colonic biopsies. CT without contrast sunday w/ mild bowel wall thickening and mucosal enhancement involving portions of sigmoid and rectum, possible proctocolitis.  Did not appear that patient was still on Humira that was initially prescribed in 2022. Patient states she takes pills before each meal (Creon). Plans for Crohn's evaluation/management as an outpatient.  Pancreatic lesions: outpatient follow up. MRI recommended but given her kidney disease, she is not a candidate for contrast study.   Plan:   Avoid all NSAIDs. Outpatient management of Crohn's disease and pancreatic lesion per Dr. [name] Payor.  Would recommend observing her for additional 24 hours, will make her NPO after midnight in case she has further bleedings or drop in her Hgb. Would consider repeat EGD tomorrow if these findings noted.  Clear liquids today.  Continue PPI infusion for total of 72 hours. She will need pantoprazole 40mg  BID as outpatient.  LOS: 4 days   Leanna Battles. Dixon Boos [facility]  [facility] [phone] 4/25/202411:52 AM

## 2023-02-22 ENCOUNTER — Encounter (HOSPITAL_COMMUNITY): Payer: Self-pay | Admitting: Internal Medicine

## 2023-02-22 ENCOUNTER — Encounter (HOSPITAL_COMMUNITY)
Admission: EM | Disposition: A | Discharge status: Home Health | Payer: Self-pay | Source: Home / Self Care | Attending: Family Medicine

## 2023-02-22 ENCOUNTER — Inpatient Hospital Stay (HOSPITAL_COMMUNITY): Payer: Medicare PPO | Admitting: Certified Registered"

## 2023-02-22 DIAGNOSIS — I2510 Atherosclerotic heart disease of native coronary artery without angina pectoris: Secondary | ICD-10-CM

## 2023-02-22 DIAGNOSIS — K921 Melena: Secondary | ICD-10-CM | POA: Diagnosis not present

## 2023-02-22 DIAGNOSIS — I509 Heart failure, unspecified: Secondary | ICD-10-CM

## 2023-02-22 DIAGNOSIS — K264 Chronic or unspecified duodenal ulcer with hemorrhage: Secondary | ICD-10-CM | POA: Diagnosis not present

## 2023-02-22 DIAGNOSIS — K2210 Ulcer of esophagus without bleeding: Secondary | ICD-10-CM | POA: Diagnosis not present

## 2023-02-22 DIAGNOSIS — K274 Chronic or unspecified peptic ulcer, site unspecified, with hemorrhage: Secondary | ICD-10-CM | POA: Diagnosis not present

## 2023-02-22 DIAGNOSIS — I110 Hypertensive heart disease with heart failure: Secondary | ICD-10-CM

## 2023-02-22 HISTORY — PX: SCLEROTHERAPY: SHX6841

## 2023-02-22 HISTORY — PX: HEMOSTASIS CLIP PLACEMENT: SHX6857

## 2023-02-22 HISTORY — PX: ESOPHAGOGASTRODUODENOSCOPY (EGD) WITH PROPOFOL: SHX5813

## 2023-02-22 LAB — TYPE AND SCREEN
Unit division: 0
Unit division: 0

## 2023-02-22 LAB — BASIC METABOLIC PANEL
Anion gap: 9 (ref 5–15)
BUN: 92 mg/dL — ABNORMAL HIGH (ref 8–23)
CO2: 18 mmol/L — ABNORMAL LOW (ref 22–32)
Calcium: 7.6 mg/dL — ABNORMAL LOW (ref 8.9–10.3)
Chloride: 108 mmol/L (ref 98–111)
Creatinine, Ser: 2.5 mg/dL — ABNORMAL HIGH (ref 0.44–1.00)
GFR, Estimated: 20 mL/min — ABNORMAL LOW (ref >60)
Glucose, Bld: 118 mg/dL — ABNORMAL HIGH (ref 70–99)
Potassium: 4 mmol/L (ref 3.5–5.1)
Sodium: 135 mmol/L (ref 135–145)

## 2023-02-22 LAB — GLUCOSE, CAPILLARY
Glucose-Capillary: 129 mg/dL — ABNORMAL HIGH (ref 70–99)
Glucose-Capillary: 175 mg/dL — ABNORMAL HIGH (ref 70–99)
Glucose-Capillary: 164 mg/dL — ABNORMAL HIGH (ref 70–99)
Glucose-Capillary: 159 mg/dL — ABNORMAL HIGH (ref 70–99)
Glucose-Capillary: 191 mg/dL — ABNORMAL HIGH (ref 70–99)
Glucose-Capillary: 139 mg/dL — ABNORMAL HIGH (ref 70–99)

## 2023-02-22 LAB — HEMOGLOBIN AND HEMATOCRIT, BLOOD
HCT: 24.1 % — ABNORMAL LOW (ref 36.0–46.0)
Hemoglobin: 7.7 g/dL — ABNORMAL LOW (ref 12.0–15.0)

## 2023-02-22 LAB — CBC
HCT: 22.9 % — ABNORMAL LOW (ref 36.0–46.0)
Hemoglobin: 7.1 g/dL — ABNORMAL LOW (ref 12.0–15.0)
MCH: 30.1 pg (ref 26.0–34.0)
MCHC: 31 g/dL (ref 30.0–36.0)
MCV: 97 fL (ref 80.0–100.0)
Platelets: 162 10*3/uL (ref 150–400)
RBC: 2.36 MIL/uL — ABNORMAL LOW (ref 3.87–5.11)
RDW: 17.4 % — ABNORMAL HIGH (ref 11.5–15.5)
WBC: 5.1 10*3/uL (ref 4.0–10.5)
nRBC: 0 % (ref 0.0–0.2)

## 2023-02-22 LAB — PREPARE RBC (CROSSMATCH)

## 2023-02-22 LAB — BPAM RBC: ISSUE DATE / TIME: 202404261607

## 2023-02-22 SURGERY — ESOPHAGOGASTRODUODENOSCOPY (EGD) WITH PROPOFOL
Anesthesia: General

## 2023-02-22 MED ORDER — FUROSEMIDE 10 MG/ML IJ SOLN
20.0000 mg | Freq: Once | INTRAMUSCULAR | Status: AC
  Administered 2023-02-22: 20 mg via INTRAVENOUS
  Filled 2023-02-22: qty 2

## 2023-02-22 MED ORDER — ONDANSETRON HCL 4 MG/2ML IJ SOLN
INTRAMUSCULAR | Status: AC
  Filled 2023-02-22: qty 2

## 2023-02-22 MED ORDER — STERILE WATER FOR IRRIGATION IR SOLN
Status: DC | PRN
  Administered 2023-02-22: 60 mL

## 2023-02-22 MED ORDER — SODIUM CHLORIDE 0.9% IV SOLUTION: Freq: Once | INTRAVENOUS | Status: DC

## 2023-02-22 MED ORDER — DIPHENHYDRAMINE HCL 25 MG PO CAPS: 25.0000 mg | ORAL_CAPSULE | Freq: Once | ORAL | Status: DC

## 2023-02-22 MED ORDER — SUCRALFATE 1 GM/10ML PO SUSP
1.0000 g | Freq: Three times a day (TID) | ORAL | Status: DC
  Administered 2023-02-22: 1 g via ORAL
  Filled 2023-02-22 (×3): qty 10

## 2023-02-22 MED ORDER — ACETAMINOPHEN 325 MG PO TABS: 650.0000 mg | ORAL_TABLET | Freq: Once | ORAL | Status: DC

## 2023-02-22 MED ORDER — LIDOCAINE HCL (CARDIAC) PF 100 MG/5ML IV SOSY
PREFILLED_SYRINGE | INTRAVENOUS | Status: DC | PRN
  Administered 2023-02-22: 50 mg via INTRAVENOUS

## 2023-02-22 MED ORDER — LACTATED RINGERS IV SOLN: INTRAVENOUS | Status: DC

## 2023-02-22 MED ORDER — LIDOCAINE HCL (PF) 1 % IJ SOLN
INTRAMUSCULAR | Status: AC
  Filled 2023-02-22: qty 2

## 2023-02-22 MED ORDER — SODIUM CHLORIDE (PF) 0.9 % IJ SOLN
PREFILLED_SYRINGE | INTRAMUSCULAR | Status: DC | PRN
  Administered 2023-02-22: 2.5 mL

## 2023-02-22 MED ORDER — SODIUM CHLORIDE 0.9 % IV SOLN: INTRAVENOUS | Status: DC

## 2023-02-22 MED ORDER — PROPOFOL 10 MG/ML IV BOLUS
INTRAVENOUS | Status: DC | PRN
  Administered 2023-02-22: 50 mg via INTRAVENOUS
  Administered 2023-02-22 (×2): 20 mg via INTRAVENOUS
  Administered 2023-02-22 (×2): 30 mg via INTRAVENOUS
  Administered 2023-02-22: 20 mg via INTRAVENOUS

## 2023-02-22 MED ORDER — ESMOLOL HCL 100 MG/10ML IV SOLN
INTRAVENOUS | Status: DC | PRN
  Administered 2023-02-22: 5 mg via INTRAVENOUS

## 2023-02-22 MED ORDER — ONDANSETRON HCL 4 MG/2ML IJ SOLN
INTRAMUSCULAR | Status: DC | PRN
  Administered 2023-02-22: 4 mg via INTRAVENOUS

## 2023-02-22 NOTE — Anesthesia Postprocedure Evaluation (Signed)
Anesthesia Post Note  Patient: Heather [name]  Procedure(s) Performed: ESOPHAGOGASTRODUODENOSCOPY (EGD) WITH PROPOFOL SCLEROTHERAPY HEMOSTASIS CLIP PLACEMENT  Patient location during evaluation: PACU Anesthesia Type: General Level of consciousness: awake and alert and oriented Pain management: pain level controlled Vital Signs Assessment: post-procedure vital signs reviewed and stable Respiratory status: spontaneous breathing and respiratory function stable Cardiovascular status: blood pressure returned to baseline and stable Postop Assessment: no apparent nausea or vomiting Anesthetic complications: no Comments: Started 1 PRBC transfusion.  No notable events documented.   Last Vitals:  Vitals:   02/22/23 1645 02/22/23 1657  BP: (!) 142/74 (!) 167/78  Pulse: (!) 129 (!) 123  Resp: (!) 23 13  Temp:  36.9 C  SpO2: 100% 100%    Last Pain:  Vitals:   02/22/23 1640  TempSrc:   PainSc: 0-No pain                 Jariana Shumard C Mylin Gignac

## 2023-02-22 NOTE — Plan of Care (Signed)
  Problem: Acute Rehab OT Goals (only OT should resolve) Goal: Pt. Will Perform Grooming Flowsheets (Taken 02/22/2023 0955) Pt Will Perform Grooming:  with modified independence  sitting Goal: Pt. Will Perform Lower Body Bathing Flowsheets (Taken 02/22/2023 0955) Pt Will Perform Lower Body Bathing:  with min guard assist  sitting/lateral leans Goal: Pt. Will Perform Lower Body Dressing Flowsheets (Taken 02/22/2023 0955) Pt Will Perform Lower Body Dressing:  with min guard assist  sitting/lateral leans Goal: Pt. Will Transfer To Toilet Flowsheets (Taken 02/22/2023 [phone]) Pt Will Transfer to Toilet:  with min assist  stand pivot transfer Goal: Pt/Caregiver Will Perform Home Exercise Program Flowsheets (Taken 02/22/2023 [phone]) Pt/caregiver will Perform Home Exercise Program:  Increased ROM  Increased strength  Right Upper extremity  Left upper extremity  With minimal assist  Paschal Blanton OT, MOT

## 2023-02-22 NOTE — Transfer of Care (Signed)
Immediate Anesthesia Transfer of Care Note  Patient: Heather [name]  Procedure(s) Performed: ESOPHAGOGASTRODUODENOSCOPY (EGD) WITH PROPOFOL  Patient Location: PACU  Anesthesia Type:General  Level of Consciousness: awake  Airway & Oxygen Therapy: Patient Spontanous Breathing and Patient connected to nasal cannula oxygen  Post-op Assessment: Report given to RN and Post -op Vital signs reviewed and stable  Post vital signs: Reviewed and stable  Last Vitals:  Vitals Value Taken Time  BP 131/71   Temp 98.5   Pulse 117 02/22/23 1638  Resp 13 02/22/23 1638  SpO2 100 % 02/22/23 1638  Vitals shown include unvalidated device data.  Last Pain:  Vitals:   02/22/23 1612  TempSrc:   PainSc: 5       Patients Stated Pain Goal: 0 (02/22/23 1400)  Complications: No notable events documented.

## 2023-02-22 NOTE — Evaluation (Signed)
Occupational Therapy Evaluation Patient Details Name: Heather [name] MRN: [id number] DOB: October 12, 1951 Today's Date: 02/22/2023   History of Present Illness Heather [name] is a 72 y.o. female with medical history significant for diabetes mellitus, Crohn's disease, CKD 4, diastolic CHF, pulmonary hypertension, gout.  Patient presented to the ED with reports of dark red stools. Per notes, patient was confused in triage.  After transfusion in ED, mental status improved.  At time of my evaluation, patient is A & O x 4, she is able to give a history.  She tells me she has been seeing blood in her stools.  She reports 4 bowel movements today, denies pain with bowel movements.  Denies abdominal pain.  No vomiting.  Reports poor oral intake.  She denies difficulty breathing, no orthopnea, no chest pain.  She takes ibuprofen about 3 tablets twice a day on average 2 days a week.  She has history of Crohn's, at baseline has 0-1 bowel movements daily.    Reports dysuria over the past 1 to 2 days.   Clinical Impression   Pt agreeable to OT evaluation. Pt reporting she has been using a w/c for mobility the past 4 months with husband assisting with transfers without use of RW. Today pt required min to mod A for bed mobility and mod to max A for one rep of sit to stand. Pt very anxious and fearful during sit to stand. Pt reports having support at home from husband, but no family present to confirm. Limited functional UE use as well with much limitation in R UE. Pt will benefit from continued OT in the hospital and recommended venue below to increase strength, balance, and endurance for safe ADL's.        Recommendations for follow up therapy are one component of a multi-disciplinary discharge planning process, led by the attending physician.  Recommendations may be updated based on patient status, additional functional criteria and insurance authorization.   Assistance Recommended at Discharge Intermittent  Supervision/Assistance  Patient can return home with the following A lot of help with walking and/or transfers;A lot of help with bathing/dressing/bathroom;Assistance with cooking/housework;Assist for transportation;Help with stairs or ramp for entrance    Functional Status Assessment  Patient has had a recent decline in their functional status and demonstrates the ability to make significant improvements in function in a reasonable and predictable amount of time.  Equipment Recommendations  None recommended by OT;Other (comment) (Follow up about BSC if pt ends up going home.)           Precautions / Restrictions Precautions Precautions: Fall Restrictions Weight Bearing Restrictions: No      Mobility Bed Mobility Overal bed mobility: Needs Assistance Bed Mobility: Supine to Sit, Sit to Supine     Supine to sit: Min assist, Mod assist Sit to supine: Min assist, Mod assist   General bed mobility comments: increased time, labored movement    Transfers Overall transfer level: Needs assistance Equipment used: 1 person hand held assist Transfers: Sit to/from Stand Sit to Stand: Max assist, Mod assist           General transfer comment: RW not used due to pt's refusal and report that she does not use it at home. Much assist to boost from EOB and quickly sitting back down.      Balance Overall balance assessment: Needs assistance Sitting-balance support: Feet supported, No upper extremity supported Sitting balance-Leahy Scale: Fair Sitting balance - Comments: seated at EOB   Standing balance  support: Bilateral upper extremity supported Standing balance-Leahy Scale: Poor Standing balance comment: Anterior support from therapist.                           ADL either performed or assessed with clinical judgement   ADL Overall ADL's : Needs assistance/impaired     Grooming: Sitting;Minimal assistance       Lower Body Bathing: Moderate  assistance;Sitting/lateral leans   Upper Body Dressing : Minimal assistance;Sitting   Lower Body Dressing: Moderate assistance;Sitting/lateral leans Lower Body Dressing Details (indicate cue type and reason): able to doff socks ; assist to don seated at EOB Toilet Transfer: Maximal assistance;Stand-pivot;Moderate assistance Toilet Transfer Details (indicate cue type and reason): partially simulated via sit to stand from EOB Toileting- Clothing Manipulation and Hygiene: Maximal assistance;Bed level       Functional mobility during ADLs: Maximal assistance;+2 for physical assistance;+2 for safety/equipment;Rolling walker (2 wheels)       Vision Baseline Vision/History: 0 No visual deficits Ability to See in Adequate Light: 0 Adequate Patient Visual Report: No change from baseline Vision Assessment?: No apparent visual deficits                Pertinent Vitals/Pain Pain Assessment Pain Assessment: 0-10 Pain Score: 6  Pain Location: stomach Pain Descriptors / Indicators: Other (Comment) ("press, grab") Pain Intervention(s): Limited activity within patient's tolerance, Monitored during session, Repositioned     Hand Dominance Right   Extremity/Trunk Assessment Upper Extremity Assessment Upper Extremity Assessment: RUE deficits/detail;LUE deficits/detail RUE Deficits / Details: 2+/5 R shoulder flexion; 3-/5 elbow extension and extension; generally weak otherwise. LUE Deficits / Details: 3-/5 shoulder flexion; generally weak otherwise.   Lower Extremity Assessment Lower Extremity Assessment: Defer to PT evaluation   Cervical / Trunk Assessment Cervical / Trunk Assessment: Normal   Communication Communication Communication: No difficulties   Cognition Arousal/Alertness: Awake/alert Behavior During Therapy: Anxious Overall Cognitive Status: Within Functional Limits for tasks assessed                                                        Home  Living Family/patient expects to be discharged to:: Private residence Living Arrangements: Spouse/significant other Available Help at Discharge: Available PRN/intermittently Type of Home: House Home Access: Ramped entrance   Entrance Stairs-Rails: None Home Layout: One level     Bathroom Shower/Tub: Chief Strategy Officer: Handicapped height Bathroom Accessibility: Yes How Accessible: Accessible via walker Home Equipment: Shower seat;Wheelchair Financial trader (4 wheels);Wheelchair - power   Additional Comments: Per previous documentation and pt report.      Prior Functioning/Environment Prior Level of Function : Needs assist       Physical Assist : Mobility (physical);ADLs (physical)     Mobility Comments: Pt reports w/c mobility the last 4 months. Husband assisting to transfer pt to w/c. ADLs Comments: Assisted by family.        OT Problem List: Decreased strength;Decreased range of motion;Decreased activity tolerance;Impaired balance (sitting and/or standing);Impaired UE functional use;Pain;Decreased safety awareness      OT Treatment/Interventions: Self-care/ADL training;Therapeutic exercise;DME and/or AE instruction;Therapeutic activities;Balance training;Patient/family education    OT Goals(Current goals can be found in the care plan section) Acute Rehab OT Goals Patient Stated Goal: return home OT Goal Formulation: With patient Time For Goal Achievement:  03/08/23 Potential to Achieve Goals: Good  OT Frequency: Min 2X/week                                   End of Session Equipment Utilized During Treatment: Gait belt  Activity Tolerance: Patient tolerated treatment well Patient left: in bed;with call bell/phone within reach;with bed alarm set  OT Visit Diagnosis: Unsteadiness on feet (R26.81);Other abnormalities of gait and mobility (R26.89);Muscle weakness (generalized) (M62.81)                Time: 1610-9604 OT Time  Calculation (min): 15 min Charges:  OT General Charges $OT Visit: 1 Visit OT Evaluation $OT Eval Low Complexity: 1 Low  Navarro Nine OT, MOT   Danie Chandler 02/22/2023, 9:52 AM

## 2023-02-22 NOTE — Progress Notes (Addendum)
Possible ongoing melena overnight. Denies abdominal pain, chest pain, or shortness of breath. EGD 02/19/23 with esophageal ulcers and esophagitis with no stigmata of bleeding s/p biopsy and non-obstructing oozing duodenal ulcers with oozing hemorrhage and associated clots. This was injected and clips placed.   Patient had drop in her Hgb from 8.4 >> 7.1 today. Given her prior EGD findings she was offered repeat EGD and is agreeable to proceed.  I have discussed the risks, alternatives, benefits with regards to but not limited to the risk of reaction to medication, bleeding, infection, perforation and the patient is agreeable to proceed. Written consent to be obtained. Husband consented as well.    She should continue PPI infusion for 72 hours and IV PPI thereafter.  Completely avoid NSAIDs.  Continue carafate slurry 1g q6h.  Brooke Bonito, MSN, APRN, FNP-BC, AGACNP-BC [facility] at [facility]

## 2023-02-22 NOTE — Op Note (Addendum)
[facility] Patient Name: Heather [name] Procedure Date: 02/22/2023 3:51 PM MRN: [id number] Date of Birth: Apr 17, 1951 Attending MD: Gennette Pac , MD, [id number] CSN: [id number] Age: 72 Admit Type: Inpatient Procedure:                Upper GI endoscopy Indications:              Melena; Aleve use weekly and nearly daily use of BC                            powders as patient reports Providers:                Gennette Pac, MD, [place] Scrape RN,                            RN, Ina Homes, Technician Referring MD:              Medicines:                Propofol per Anesthesia Complications:            No immediate complications. Estimated Blood Loss:     Estimated blood loss was minimal. Procedure:                Pre-Anesthesia Assessment:                           - Prior to the procedure, a History and Physical                            was performed, and patient medications and                            allergies were reviewed. The patient's tolerance of                            previous anesthesia was also reviewed. The risks                            and benefits of the procedure and the sedation                            options and risks were discussed with the patient.                            All questions were answered, and informed consent                            was obtained. Prior Anticoagulants: The patient has                            taken no anticoagulant or antiplatelet agents. ASA                            Grade Assessment: III - A patient with severe  systemic disease. After reviewing the risks and                            benefits, the patient was deemed in satisfactory                            condition to undergo the procedure.                           After obtaining informed consent, the endoscope was                            passed under direct vision. Throughout the                             procedure, the patient's blood pressure, pulse, and                            oxygen saturations were monitored continuously. The                            [id number]-[id number] ([id number]) scope was introduced through the                            mouth, and advanced to the third part of duodenum.                            The upper GI endoscopy was accomplished without                            difficulty. The patient tolerated the procedure                            well. Scope In: 4:19:03 PM Scope Out: 4:33:35 PM Total Procedure Duration: 0 hours 14 minutes 32 seconds  Findings:      Examination the tubular esophagus revealed geographic ulcerations as       noted previously. No actively bleeding areas. Stomach empty. No blood in       the stomach. Pylorus patent examination of bulb second third portion       revealed extensive geographic ulceration of the second portion.       Hemostasis clip in place oriented at the periphery of proximal aspect of       the area of duodenal ulceration. In the more distal aspect of the area       of ulceration there was a visible vessel protruding from the ulcer base.       Please see photos. 2.5 cc of 1 10,000 epinephrine was injected at the       periphery of this ulcer crater subsequently I attempted to place a       resolution clip. I could not get it to engage satisfactorily.       Subsequently, I closed this area nicely with 1 mantis clip.      The remaining area of duodenal geographic ulceration had touch       friability but no discrete bleeder seen.  Impression:               - Ulcerative esophagitis. Geographic duodenal                            ulceration previously placed clip in place. Focal                            area of pigmented protuberance/visible vessel -                            distal aspect of ulcerated duodenum.. Status post                            epinephrine injection and hemostasis clip placement.                            -Path report from prior endoscopic biopsies                            reviewed. Nonspecific inflammation in the esophagus                            and stomach. No obvious infection. Small bowel                            histology with nothing classic for Crohn's disease.                           -I suspect the insults we are seeing in her upper                            GI tract are related to excessive NSAID use in the                            setting of a frail elderly lady with multiple                            comorbidities. Not surprising, she has rebled.                            Hemoglobin is stable. Moderate Sedation:      Moderate (conscious) sedation was personally administered by an       anesthesia professional. The following parameters were monitored: oxygen       saturation, heart rate, blood pressure, respiratory rate, EKG, adequacy       of pulmonary ventilation, and response to care. Recommendation:           - Return patient to hospital ward for ongoing care.                           - Clear liquid diet. Trend hemoglobin                           - Continue present medications. Continue Carafate  slurry's. Continue PPI infusion at least another 24                            hours. No future MRI until clip known to be gone                           - At a minimum, I would recommend a repeat EGD in                            approximately 12 weeks. Would avoid all NSAIDs for                            the time being. At patient request, I called [name], Sr. at [phone]. Reviewed findings                            and recommendations. His questions were answered.. Procedure Code(s):        --- Professional ---                           [phone], Esophagogastroduodenoscopy, flexible,                            transoral; diagnostic, including collection of                            specimen(s)  by brushing or washing, when performed                            (separate procedure) Diagnosis Code(s):        --- Professional ---                           K92.1, Melena (includes Hematochezia) CPT copyright 2022 American Medical Association. All rights reserved. The codes documented in this report are preliminary and upon coder review may  be revised to meet current compliance requirements. Gerrit Friends. Johncharles Fusselman, MD Gennette Pac, MD 02/22/2023 4:59:47 PM This report has been signed electronically. Number of Addenda: 0

## 2023-02-22 NOTE — Care Management Important Message (Signed)
Important Message  Patient Details  Name: Heather [name] MRN: [id number] Date of Birth: 1951/03/11   Medicare Important Message Given:  Yes     Corey Harold 02/22/2023, 11:32 AM

## 2023-02-22 NOTE — Anesthesia Preprocedure Evaluation (Signed)
Anesthesia Evaluation  Patient identified by MRN, date of birth, ID band Patient awake    Reviewed: Allergy & Precautions, H&P , NPO status , Patient's Chart, lab work & pertinent test results, reviewed documented beta blocker date and time   Airway Mallampati: III  TM Distance: >3 FB Neck ROM: Full    Dental  (+) Dental Advisory Given, Teeth Intact   Pulmonary shortness of breath (wheel chair bound) and with exertion, pneumonia   Pulmonary exam normal breath sounds clear to auscultation       Cardiovascular Exercise Tolerance: Poor METS (wheelchair bound): hypertension, Pt. on medications and Pt. on home beta blockers + CAD and +CHF  Normal cardiovascular exam Rhythm:Regular Rate:Normal  1. Left ventricular ejection fraction, by estimation, is 60 to 65%. The  left ventricle has normal function. The left ventricle has no regional  wall motion abnormalities. Left ventricular diastolic parameters are  consistent with Grade I diastolic  dysfunction (impaired relaxation).   2. Right ventricular systolic function is normal. The right ventricular  size is normal.   3. Left atrial size was moderately dilated.   4. Right atrial size was mildly dilated.   5. The mitral valve is normal in structure. Trivial mitral valve  regurgitation. No evidence of mitral stenosis. Moderate mitral annular  calcification.   6. The aortic valve is tricuspid. There is moderate calcification of the  aortic valve. Aortic valve regurgitation is not visualized. Mild aortic  valve stenosis. Aortic valve area, by VTI measures 1.95 cm. Aortic valve  mean gradient measures 7.0 mmHg.  Aortic valve Vmax measures 1.69 m/s.   7. The inferior vena cava is normal in size with greater than 50%  respiratory variability, suggesting right atrial pressure of 3 mmHg.   Comparison(s): EF 60-65%.     Neuro/Psych  Headaches PSYCHIATRIC DISORDERS Anxiety Depression      Neuromuscular disease CVA    GI/Hepatic Neg liver ROS, hiatal hernia,GERD  Medicated,,  Endo/Other  diabetes, Well Controlled, Type 2, Insulin Dependent    Renal/GU Renal disease  negative genitourinary   Musculoskeletal  (+) Arthritis , Osteoarthritis,    Abdominal   Peds negative pediatric ROS (+)  Hematology  (+) Blood dyscrasia, anemia   Anesthesia Other Findings   Reproductive/Obstetrics negative OB ROS                             Anesthesia Physical Anesthesia Plan  ASA: 3  Anesthesia Plan: General   Post-op Pain Management: Minimal or no pain anticipated   Induction: Intravenous  PONV Risk Score and Plan: Propofol infusion  Airway Management Planned: Nasal Cannula and Natural Airway  Additional Equipment:   Intra-op Plan:   Post-operative Plan: Possible Post-op intubation/ventilation  Informed Consent: I have reviewed the patients History and Physical, chart, labs and discussed the procedure including the risks, benefits and alternatives for the proposed anesthesia with the patient or authorized representative who has indicated his/her understanding and acceptance.     Dental advisory given  Plan Discussed with: CRNA and Surgeon  Anesthesia Plan Comments:         Anesthesia Quick Evaluation

## 2023-02-22 NOTE — TOC Progression Note (Signed)
Transition of Care [facility]) - Progression Note    Patient Details  Name: Heather [name] MRN: [id number] Date of Birth: 12/23/1950  [facility] CM/SW Contact  Villa Herb, [place] Phone Number: 02/22/2023, 12:50 PM  Clinical Narrative:    CSW spoke with pt about OT recommendation for SNF if she did not have assistance in the home. Pt states she would like to return home with [facility]. Pt states she has all the needed DME already in the home. TOC to follow up with pt and spouse once PT has been able to work with pt. TOC to follow.     Barriers to Discharge: Continued Medical Work up  Expected Discharge Plan and Services       Living arrangements for the past 2 months: Single Family Home                                       Social Determinants of Health (SDOH) Interventions SDOH Screenings   Food Insecurity: No Food Insecurity (02/17/2023)  Housing: Low Risk  (02/17/2023)  Transportation Needs: No Transportation Needs (02/17/2023)  Utilities: Not At Risk (02/17/2023)  Tobacco Use: Low Risk  (02/19/2023)    Readmission Risk Interventions    02/06/2023    8:28 AM 11/14/2022   11:31 AM 11/12/2022    4:06 PM  Readmission Risk Prevention Plan  Transportation Screening Complete  Complete  Medication Review (RN Care Manager) Complete  Complete  PCP or Specialist appointment within 3-5 days of discharge  Complete Not Complete  HRI or Home Care Consult Complete  Complete  SW Recovery Care/Counseling Consult Complete  Complete  Palliative Care Screening Not Applicable  Not Applicable  Skilled Nursing Facility Not Applicable  Complete

## 2023-02-22 NOTE — Progress Notes (Signed)
PROGRESS NOTE    Heather [name]  RUE:[id number] DOB: 1951/09/08 DOA: 02/17/2023 PCP: [name], MD   Brief Narrative:    Heather [name] is a 72 y.o. female with medical history significant for diabetes mellitus, Crohn's disease, CKD 4, diastolic CHF, pulmonary hypertension, gout. Patient presented to the ED with reports of dark red stools. Per notes, patient was confused in triage.  After transfusion in ED, mental status improved.  Patient was admitted with symptomatic acute blood loss anemia in the setting of active GI bleed.  She has had 3 unit PRBC transfusion with improvement in hemoglobin levels and underwent GI evaluation with EGD and flex sigmoidoscopy on 4/23 with findings of esophageal ulcers and duodenal ulcers with oozing hemorrhage requiring clips.  Biopsies also taken for possible Crohn's.  GI recommending clear liquid diet and Protonix infusion for 72 hours.  Subjective: Patient was seen and examined this morning, stable awake alert oriented, has had 2 bloody bowel movements yesterday, H&H dropped back to 7.1, 7.7    Assessment & Plan:   Principal Problem:   GI bleed Active Problems:   Acute renal failure superimposed on stage 4 chronic kidney disease (HCC)   Acute metabolic encephalopathy   Acute on chronic anemia   Pancreatic lesion   Proctocolitis   Hypoalbuminemia   Diabetes mellitus (HCC)   Essential hypertension   UTI (urinary tract infection)   Gout   Visual disturbance   Cerebrovascular accident (CVA) due to embolism of cerebral artery (HCC)   Rectal bleeding   ABLA (acute blood loss anemia)   PUD (peptic ulcer disease)  Assessment and Plan:  Acute symptomatic blood loss anemia secondary to GI bleed -With acute on chronic anemia.   - POA: Hgb was down to 3.2, recent hemoglobin/14 on discharge  -Thus far patient received 1 unit PRBC on arrival + subsequently 3 more units .   - Positive NSAID use history. Not on anticoagulation.   -  10/2020, capsule endoscopy-several small aphthous appearing ulcers scattered in small bowel,-mild IBD/Crohn's.  -Colonoscopy and EGD 2021 were unremarkable.  -Underwent EGD and flex sigmoidoscopy 02/19/2023  with recommendations to remain on PPI drip for total 72 hours as well as clear liquid diet >>>       Latest Ref Rng & Units 02/22/2023   11:14 AM 02/22/2023    4:23 AM 02/21/2023   12:22 PM  CBC  WBC 4.0 - 10.5 K/uL  5.1    Hemoglobin 12.0 - 15.0 g/dL 7.7  7.1  8.4   Hematocrit 36.0 - 46.0 % 24.1  22.9  26.4   Platelets 150 - 400 K/uL  162     -02/22/2023 reported 2 bloody bowel movements overnight, hemoglobin dropped again Anticipating 2 more units of PRBC transfusion, and repeat EGD today   Proctocolitis -Stable, POA bloody stool, history of Crohn disease, leukocytosis, elevated ESR at >130.  Serum ferritin 1462.  Rules out for sepsis.   - CT abdomen and pelvis without contrast - Mild bowel wall thickening and mucosal enhancement involving portions of the sigmoid colon and rectum may reflect proctocolitis. -Was on IV ceftriaxone and metronidazole --- anticipating switching antibiotics to p.o.  (Listed allergies and intolerances to sulfa and quinolones Abxs)  -Follow-up GI recs     Pancreatic lesion Incidental finding on CT today 02/17/2023-  Low-density pancreatic lesion. When the patient is clinically stable and able to follow directions and hold their breath (preferably as an outpatient) further evaluation with dedicated abdominal MRI could be  considered for further characterization. -Follow-up as outpatient   Acute metabolic encephalopathy Mentation much improved -at baseline a AAO x 3 now   - mental status is improved after PRBC.  Encephalopathy likely due to severe anemia, Proctocolitis. UTI  -Resume home Keppra, venlafaxine    Acute renal failure superimposed on stage 4 chronic kidney disease AKI on CKD stage IV.  Cr- 3.26, last discharge creatinine 4/14- 1.8. In the  setting of severe anemia GI bleed.  -Continue daily oral bicarbonate/ IVF  Lab Results  Component Value Date   CREATININE 2.50 (H) 02/22/2023   CREATININE 2.75 (H) 02/21/2023   CREATININE 2.83 (H) 02/20/2023       Hypoalbuminemia Albumin 1.6.  Calcium 7.5, corrected calcium - 9.4.   Diabetes mellitus - HgbA1c 6.4 - SSI-    Essential hypertension -BP trending low  -Will use as needed IV labetalol 5mg  SBP>  180 -Hold home antihypertensives metoprolol 37.5mg     Urinary tract infection  -Symptomatic with dysuria -UA revealed large leukocyte esterase, >50 WBCs, will follow-up with cultures <10 K colonies insignificant growth -Current antibiotics of Rocephin   Gout Resume allopurinol  Screening nasal MRSA positive -Continue Bactroban x 5 days   DVT prophylaxis: SCDs Code Status: Full Family Communication: None at bedside.  Called patient's husband Mr. Vialpando discussed plan of care updated today 02/22/2023  Disposition Plan: GI evaluation Status is: Inpatient Remains inpatient appropriate because: Need for IV medications.  Consultants:  GI  Procedures:  EGD and flex sigmoidoscopy 4/23  Antimicrobials:  Anti-infectives (From admission, onward)    Start     Dose/Rate Route Frequency Ordered Stop   02/21/23 1400  metroNIDAZOLE (FLAGYL) tablet 500 mg        500 mg Oral Every 8 hours 02/21/23 1053     02/17/23 2200  cefTRIAXone (ROCEPHIN) 2 g in sodium chloride 0.9 % 100 mL IVPB        2 g 200 mL/hr over 30 Minutes Intravenous Every 24 hours 02/17/23 2155     02/17/23 2200  metroNIDAZOLE (FLAGYL) IVPB 500 mg  Status:  Discontinued        500 mg 100 mL/hr over 60 Minutes Intravenous Every 12 hours 02/17/23 2155 02/21/23 1053        Objective: Vitals:   02/21/23 2340 02/22/23 0250 02/22/23 1145 02/22/23 1146  BP: (!) 132/59 (!) 121/29 104/67 104/67  Pulse: 99 (!) 109 (!) 112 (!) 112  Resp: 18 19 18    Temp: 98.4 F (36.9 C) 98.1 F (36.7 C) 98.6 F (37 C)  98.6 F (37 C)  TempSrc: Oral Oral Oral Oral  SpO2: 100% 100% 100% 100%  Weight:      Height:        Intake/Output Summary (Last 24 hours) at 02/22/2023 1200 Last data filed at 02/22/2023 0700 Gross per 24 hour  Intake 440 ml  Output --  Net 440 ml   Filed Weights   02/18/23 0500 02/19/23 0951 02/20/23 0504  Weight: 55 kg 55 kg 55.9 kg            General:  AAO x 3,  cooperative, no distress;   HEENT:  Normocephalic, PERRL, otherwise with in Normal limits   Neuro:  CNII-XII intact. , normal motor and sensation, reflexes intact   Lungs:   Clear to auscultation BL, Respirations unlabored,  No wheezes / crackles  Cardio:    S1/S2, RRR, No murmure, No Rubs or Gallops   Abdomen:  Soft, non-tender, bowel sounds active  all four quadrants, no guarding or peritoneal signs.  Muscular  skeletal:  Limited exam -global generalized weaknesses - in bed, able to move all 4 extremities,   2+ pulses,  symmetric, No pitting edema  Skin:  Dry, warm to touch, negative for any Rashes,  Wounds: Please see nursing documentation  Pressure Injury 02/19/23 Buttocks Right Stage 2 -  Partial thickness loss of dermis presenting as a shallow open injury with a red, pink wound bed without slough. 1 x 0.8 (Active)  02/19/23 0500  Location: Buttocks  Location Orientation: Right  Staging: Stage 2 -  Partial thickness loss of dermis presenting as a shallow open injury with a red, pink wound bed without slough.  Wound Description (Comments): 1 x 0.8  Present on Admission: Yes (as per admitting nurse)  Dressing Type Foam - Lift dressing to assess site every shift 02/22/23 0725     Pressure Injury 02/19/23 Buttocks Left Stage 2 -  Partial thickness loss of dermis presenting as a shallow open injury with a red, pink wound bed without slough. 1.5 x 1 (Active)  02/19/23 0500  Location: Buttocks  Location Orientation: Left  Staging: Stage 2 -  Partial thickness loss of dermis presenting as a shallow open  injury with a red, pink wound bed without slough.  Wound Description (Comments): 1.5 x 1  Present on Admission: Yes (as per admitting nurse)  Dressing Type Foam - Lift dressing to assess site every shift 02/21/23 2145              Data Reviewed: I have personally reviewed following labs and imaging studies  CBC: Recent Labs  Lab 02/17/23 1735 02/18/23 0634 02/19/23 0255 02/20/23 1125 02/21/23 0557 02/21/23 1222 02/22/23 0423 02/22/23 1114  WBC 16.6* 13.6* 9.8 7.5 4.9  --  5.1  --   NEUTROABS 14.4*  --   --   --   --   --   --   --   HGB 3.8* 9.5* 8.4* 8.2* 7.7* 8.4* 7.1* 7.7*  HCT 12.6* 27.6* 24.7* 24.7* 24.0* 26.4* 22.9* 24.1*  MCV 102.4* 86.8 88.5 91.5 93.8  --  97.0  --   PLT 367 249 208 201 177  --  162  --    Basic Metabolic Panel: Recent Labs  Lab 02/18/23 0634 02/19/23 0255 02/20/23 1125 02/21/23 0557 02/22/23 0423  NA 135 134* 133* 134* 135  K 4.4 4.0 3.8 3.9 4.0  CL 102 102 103 105 108  CO2 21* 22 20* 20* 18*  GLUCOSE 132* 75 219* 175* 118*  BUN 115* 112* 101* 100* 92*  CREATININE 3.13* 3.27* 2.83* 2.75* 2.50*  CALCIUM 7.4* 7.0* 7.4* 7.7* 7.6*  MG  --  1.3*  --   --   --    GFR: Estimated Creatinine Clearance: 18.1 mL/min (A) (by C-G formula based on SCr of 2.5 mg/dL (H)). Liver Function Tests: Recent Labs  Lab 02/17/23 1604  AST 25  ALT 11  ALKPHOS 94  BILITOT 0.6  PROT 4.3*  ALBUMIN 1.6*   Recent Labs  Lab 02/17/23 1735  LIPASE 21    HbA1C: No results for input(s): "HGBA1C" in the last 72 hours.  CBG: Recent Labs  Lab 02/21/23 1615 02/21/23 1950 02/21/23 2203 02/22/23 0248 02/22/23 1130  GLUCAP 207* 149* 123* 129* 175*     Recent Results (from the past 240 hour(s))  MRSA Next Gen by PCR, Nasal     Status: Abnormal   Collection Time: 02/17/23  7:35  PM   Specimen: Nasal Mucosa; Nasal Swab  Result Value Ref Range Status   MRSA by PCR Next Gen DETECTED (A) NOT DETECTED Final    Comment: RESULT CALLED TO, READ BACK BY  AND VERIFIED WITH: KING J @ 0747 ON [id number] BY HENDERSON L (NOTE) The GeneXpert MRSA Assay (FDA approved for NASAL specimens only), is one component of a comprehensive MRSA colonization surveillance program. It is not intended to diagnose MRSA infection nor to guide or monitor treatment for MRSA infections. Test performance is not FDA approved in patients less than 32 years old. Performed at [facility], [address] [place], [place] [address]   Urine Culture (for pregnant, neutropenic or urologic patients or patients with an indwelling urinary catheter)     Status: Abnormal   Collection Time: 02/20/23  3:20 PM   Specimen: Urine, Clean Catch  Result Value Ref Range Status   Specimen Description   Final    URINE, CLEAN CATCH Performed at [facility], [address] [place], [place] [address]    Special Requests   Final    NONE Performed at [facility], [address], [place], [place] [address]    Culture (A)  Final    <10,000 COLONIES/mL INSIGNIFICANT GROWTH Performed at [facility], [address] [place], [place] [address]    Report Status 02/21/2023 FINAL  Final         Radiology Studies: No results found.      Scheduled Meds:  (feeding supplement) PROSource Plus  30 mL Oral TID BM   sodium chloride   Intravenous Once   sodium chloride   Intravenous Once   acetaminophen  650 mg Oral Once   acidophilus  1 capsule Oral TID   allopurinol  100 mg Oral Daily   atorvastatin  80 mg Oral QHS   Chlorhexidine Gluconate Cloth  6 each Topical Q0600   diphenhydrAMINE  25 mg Oral Once   furosemide  20 mg Intravenous Once   insulin aspart  0-6 Units Subcutaneous Q4H   levETIRAcetam  500 mg Oral BID   lipase/protease/amylase  36,000 Units Oral TID WC   metroNIDAZOLE  500 mg Oral Q8H   multivitamin-lutein  1 capsule Oral Daily   mupirocin ointment   Nasal BID   [START ON 02/23/2023] pantoprazole  40 mg Intravenous Q12H   sodium bicarbonate  650 mg Oral Daily    venlafaxine XR  150 mg Oral Daily   Continuous Infusions:  cefTRIAXone (ROCEPHIN)  IV 2 g (02/21/23 2013)   pantoprazole 8 mg/hr (02/21/23 1552)     LOS: 5 days    Time spent: 35 minutes    Kendell Bane, MD  [facility]  If 7PM-7AM, please contact night-coverage [email or web address] 02/22/2023, 12:00 PM

## 2023-02-22 NOTE — Interval H&P Note (Signed)
History and Physical Interval Note:  02/22/2023 4:11 PM  Heather [name]  has presented today for surgery, with the diagnosis of anemia, duodenal ulcers, upper GI bleed.  The various methods of treatment have been discussed with the patient and family. After consideration of risks, benefits and other options for treatment, the patient has consented to  Procedure(s): ESOPHAGOGASTRODUODENOSCOPY (EGD) WITH PROPOFOL (N/A) as a surgical intervention.  The patient's history has been reviewed, patient examined, no change in status, stable for surgery.  I have reviewed the patient's chart and labs.  Questions were answered to the patient's satisfaction.     Adlene Adduci    No stool overnight.  Patient hemodynamically stable hemoglobin 7.7 this morning.  Patient is hungry.  Concerned about recurrent bleeding.  Follow-up EGD now being done. The risks, benefits, limitations, alternatives and imponderables have been reviewed with the patient. Potential for esophageal dilation, biopsy, etc. have also been reviewed.  Questions have been answered. All parties agreeable.

## 2023-02-23 DIAGNOSIS — E1129 Type 2 diabetes mellitus with other diabetic kidney complication: Secondary | ICD-10-CM

## 2023-02-23 DIAGNOSIS — M1A9XX0 Chronic gout, unspecified, without tophus (tophi): Secondary | ICD-10-CM

## 2023-02-23 DIAGNOSIS — K2210 Ulcer of esophagus without bleeding: Secondary | ICD-10-CM | POA: Diagnosis not present

## 2023-02-23 DIAGNOSIS — I10 Essential (primary) hypertension: Secondary | ICD-10-CM

## 2023-02-23 DIAGNOSIS — K5229 Other allergic and dietetic gastroenteritis and colitis: Secondary | ICD-10-CM

## 2023-02-23 DIAGNOSIS — K264 Chronic or unspecified duodenal ulcer with hemorrhage: Secondary | ICD-10-CM | POA: Diagnosis not present

## 2023-02-23 DIAGNOSIS — D62 Acute posthemorrhagic anemia: Secondary | ICD-10-CM | POA: Diagnosis not present

## 2023-02-23 DIAGNOSIS — K921 Melena: Secondary | ICD-10-CM | POA: Diagnosis not present

## 2023-02-23 DIAGNOSIS — K279 Peptic ulcer, site unspecified, unspecified as acute or chronic, without hemorrhage or perforation: Secondary | ICD-10-CM

## 2023-02-23 LAB — GLUCOSE, CAPILLARY
Glucose-Capillary: 111 mg/dL — ABNORMAL HIGH (ref 70–99)
Glucose-Capillary: 115 mg/dL — ABNORMAL HIGH (ref 70–99)
Glucose-Capillary: 127 mg/dL — ABNORMAL HIGH (ref 70–99)
Glucose-Capillary: 131 mg/dL — ABNORMAL HIGH (ref 70–99)
Glucose-Capillary: 156 mg/dL — ABNORMAL HIGH (ref 70–99)
Glucose-Capillary: 162 mg/dL — ABNORMAL HIGH (ref 70–99)
Glucose-Capillary: 192 mg/dL — ABNORMAL HIGH (ref 70–99)

## 2023-02-23 LAB — CBC
HCT: 33.5 % — ABNORMAL LOW (ref 36.0–46.0)
Hemoglobin: 11.2 g/dL — ABNORMAL LOW (ref 12.0–15.0)
MCH: 30.7 pg (ref 26.0–34.0)
MCHC: 33.4 g/dL (ref 30.0–36.0)
MCV: 91.8 fL (ref 80.0–100.0)
Platelets: 155 10*3/uL (ref 150–400)
RBC: 3.65 MIL/uL — ABNORMAL LOW (ref 3.87–5.11)
RDW: 16.2 % — ABNORMAL HIGH (ref 11.5–15.5)
WBC: 5.4 10*3/uL (ref 4.0–10.5)
nRBC: 0 % (ref 0.0–0.2)

## 2023-02-23 LAB — BASIC METABOLIC PANEL
Anion gap: 9 (ref 5–15)
BUN: 83 mg/dL — ABNORMAL HIGH (ref 8–23)
CO2: 19 mmol/L — ABNORMAL LOW (ref 22–32)
Calcium: 8 mg/dL — ABNORMAL LOW (ref 8.9–10.3)
Chloride: 108 mmol/L (ref 98–111)
Creatinine, Ser: 2.36 mg/dL — ABNORMAL HIGH (ref 0.44–1.00)
GFR, Estimated: 22 mL/min — ABNORMAL LOW (ref >60)
Glucose, Bld: 113 mg/dL — ABNORMAL HIGH (ref 70–99)
Potassium: 3.6 mmol/L (ref 3.5–5.1)
Sodium: 136 mmol/L (ref 135–145)

## 2023-02-23 MED ORDER — CLONAZEPAM 0.5 MG PO TABS
0.5000 mg | ORAL_TABLET | Freq: Three times a day (TID) | ORAL | Status: DC | PRN
  Administered 2023-02-23 (×2): 0.5 mg via ORAL
  Filled 2023-02-23 (×2): qty 1

## 2023-02-23 MED ORDER — METOPROLOL TARTRATE 25 MG PO TABS
25.0000 mg | ORAL_TABLET | Freq: Once | ORAL | Status: AC
  Administered 2023-02-23: 25 mg via ORAL
  Filled 2023-02-23: qty 1

## 2023-02-23 MED ORDER — PHENAZOPYRIDINE HCL 100 MG PO TABS
200.0000 mg | ORAL_TABLET | Freq: Three times a day (TID) | ORAL | Status: DC
  Administered 2023-02-24 (×2): 200 mg via ORAL
  Filled 2023-02-23 (×2): qty 2

## 2023-02-23 MED ORDER — SUCRALFATE 1 G PO TABS
1.0000 g | ORAL_TABLET | Freq: Three times a day (TID) | ORAL | Status: DC
  Administered 2023-02-23 – 2023-02-25 (×7): 1 g via ORAL
  Filled 2023-02-23 (×9): qty 1

## 2023-02-23 NOTE — Progress Notes (Signed)
PROGRESS NOTE    DARCI LYKINS  RUE:[id number] DOB: [birth year] DOA: 02/17/2023 PCP: [name], MD   Brief Narrative:    Heather [name] is a 72 y.o. female with medical history significant for diabetes mellitus, Crohn's disease, CKD 4, diastolic CHF, pulmonary hypertension, gout. Patient presented to the ED with reports of dark red stools. Per notes, patient was confused in triage.  After transfusion in ED, mental status improved.  Patient was admitted with symptomatic acute blood loss anemia in the setting of active GI bleed.  She has had 3 unit PRBC transfusion with improvement in hemoglobin levels and underwent GI evaluation with EGD and flex sigmoidoscopy on 4/23 with findings of esophageal ulcers and duodenal ulcers with oozing hemorrhage requiring clips.  Biopsies also taken for possible Crohn's.  GI recommending clear liquid diet and Protonix infusion for 72 hours.  Subjective: [name] was seen and examined this morning, complaining of abdominal pain, Mildly bloody dark stool morning  Remained stable this morning Let us post EGD and blood transfusion yesterday 12/24/2022   Assessment & Plan:   Principal Problem:   GI bleed Active Problems:   Acute renal failure superimposed on stage 4 chronic kidney disease (HCC)   Acute metabolic encephalopathy   Acute on chronic anemia   Pancreatic lesion   Proctocolitis   Hypoalbuminemia   Diabetes mellitus (HCC)   Essential hypertension   UTI (urinary tract infection)   Gout   Melena   Visual disturbance   Cerebrovascular accident (CVA) due to embolism of cerebral artery (HCC)   Rectal bleeding   ABLA (acute blood loss anemia)   PUD (peptic ulcer disease)  Assessment and Plan:  Acute symptomatic blood loss anemia secondary to GI bleed -With acute on chronic anemia.   - POA: Hgb was down to 3.2, recent hemoglobin/14 on discharge  -Thus far patient received 1 unit PRBC on arrival + subsequently 3 more units .    - Positive NSAID use history. Not on anticoagulation.   - 10/2020, capsule endoscopy-several small aphthous appearing ulcers scattered in small bowel,-mild IBD/Crohn's.  -Colonoscopy and EGD 2021 were unremarkable.  -Underwent EGD and flex sigmoidoscopy 02/19/2023  with recommendations to remain on PPI drip for total 72 hours as well as clear liquid diet >>>       Latest Ref Rng & Units 02/23/2023    4:34 AM 02/22/2023   11:14 AM 02/22/2023    4:23 AM  CBC  WBC 4.0 - 10.5 K/uL 5.4   5.1   Hemoglobin 12.0 - 15.0 g/dL 14.7  7.7  7.1   Hematocrit 36.0 - 46.0 % 33.5  24.1  22.9   Platelets 150 - 400 K/uL 155   162    -02/22/2023  2 more units of PRBC transfusion, and repeat EGD   - Status post EGD 12/24/2022 Bleeding duodenal ulcer was identified, heparin was injected and hemostasis was achieved by clipping   Proctocolitis -Stable, POA bloody stool, history of Crohn disease, leukocytosis, elevated ESR at >130.  Serum ferritin 1462.  Rules out for sepsis.   - CT abdomen and pelvis without contrast - Mild bowel wall thickening and mucosal enhancement involving portions of the sigmoid colon and rectum may reflect proctocolitis. -Was on IV ceftriaxone and metronidazole --- anticipating switching antibiotics to p.o.  (Listed allergies and intolerances to sulfa and quinolones Abxs)  -Follow-up GI recs     Pancreatic lesion Incidental finding on CT today 02/17/2023-  Low-density pancreatic lesion. When the patient is  clinically stable and able to follow directions and hold their breath (preferably as an outpatient) further evaluation with dedicated abdominal MRI could be considered for further characterization. -Follow-up as outpatient   Acute metabolic encephalopathy Mentation much improved -at baseline a AAO x 3 now   - mental status is improved after PRBC.  Encephalopathy likely due to severe anemia, Proctocolitis. UTI  -Resume home Keppra, venlafaxine    Acute renal failure  superimposed on stage 4 chronic kidney disease AKI on CKD stage IV.  Cr- 3.26, last discharge creatinine 4/14- 1.8. In the setting of severe anemia GI bleed.  -Continue daily oral bicarbonate/ IVF  Lab Results  Component Value Date   CREATININE 2.36 (H) 02/23/2023   CREATININE 2.50 (H) 02/22/2023   CREATININE 2.75 (H) 02/21/2023       Hypoalbuminemia Albumin 1.6.  Calcium 7.5, corrected calcium - 9.4.   Diabetes mellitus - HgbA1c 6.4 - SSI-    Essential hypertension -BP trending low  -Will use as needed IV labetalol 5mg  SBP>  180 -Hold home antihypertensives metoprolol 37.5mg     Urinary tract infection  -Symptomatic with dysuria -UA revealed large leukocyte esterase, >50 WBCs, will follow-up with cultures <10 K colonies insignificant growth -Current antibiotics of Rocephin   Gout Resume allopurinol  Screening nasal MRSA positive -Continue Bactroban x 5 days   DVT prophylaxis: SCDs Code Status: Full Family Communication: None at bedside.  Called patient's husband Mr. Santiago discussed plan of care updated today 02/22/2023  Disposition Plan: GI evaluation Status is: Inpatient Remains inpatient appropriate because: Need for IV medications.  Consultants:  GI  Procedures:  EGD and flex sigmoidoscopy 4/23  Antimicrobials:  Anti-infectives (From admission, onward)    Start     Dose/Rate Route Frequency Ordered Stop   02/21/23 1400  metroNIDAZOLE (FLAGYL) tablet 500 mg        500 mg Oral Every 8 hours 02/21/23 1053     02/17/23 2200  cefTRIAXone (ROCEPHIN) 2 g in sodium chloride 0.9 % 100 mL IVPB        2 g 200 mL/hr over 30 Minutes Intravenous Every 24 hours 02/17/23 2155     02/17/23 2200  metroNIDAZOLE (FLAGYL) IVPB 500 mg  Status:  Discontinued        500 mg 100 mL/hr over 60 Minutes Intravenous Every 12 hours 02/17/23 2155 02/21/23 1053        Objective: Vitals:   02/23/23 0005 02/23/23 0207 02/23/23 0300 02/23/23 0649  BP: (!) 149/88 (!) 144/84 (!)  146/78 (!) 140/72  Pulse: (!) 106 (!) 110 (!) 110 (!) 110  Resp: 18 18 18 18   Temp: (!) 97.5 F (36.4 C) (!) 97.4 F (36.3 C) 97.9 F (36.6 C) 97.8 F (36.6 C)  TempSrc: Oral Oral Oral Oral  SpO2: 100% 100% 100% 100%  Weight:      Height:        Intake/Output Summary (Last 24 hours) at 02/23/2023 1131 Last data filed at 02/23/2023 0207 Gross per 24 hour  Intake 1683.42 ml  Output 300 ml  Net 1383.42 ml   Filed Weights   02/19/23 0951 02/20/23 0504 02/22/23 1400  Weight: 55 kg 55.9 kg 55.9 kg        General:  AAO x 3,  cooperative, no distress;   HEENT:  Normocephalic, PERRL, otherwise with in Normal limits   Neuro:  CNII-XII intact. , normal motor and sensation, reflexes intact   Lungs:   Clear to auscultation BL, Respirations unlabored,  No  wheezes / crackles  Cardio:    S1/S2, RRR, No murmure, No Rubs or Gallops   Abdomen:  Soft, non-tender, bowel sounds active all four quadrants, no guarding or peritoneal signs.  Muscular  skeletal:  Limited exam -global generalized weaknesses - in bed, able to move all 4 extremities,   2+ pulses,  symmetric, No pitting edema  Skin:  Dry, warm to touch, negative for any Rashes,  Wounds: Please see nursing documentation  Pressure Injury 02/19/23 Buttocks Right Stage 2 -  Partial thickness loss of dermis presenting as a shallow open injury with a red, pink wound bed without slough. 1 x 0.8 (Active)  02/19/23 0500  Location: Buttocks  Location Orientation: Right  Staging: Stage 2 -  Partial thickness loss of dermis presenting as a shallow open injury with a red, pink wound bed without slough.  Wound Description (Comments): 1 x 0.8  Present on Admission: Yes (as per admitting nurse)  Dressing Type Foam - Lift dressing to assess site every shift 02/23/23 0921     Pressure Injury 02/19/23 Buttocks Left Stage 2 -  Partial thickness loss of dermis presenting as a shallow open injury with a red, pink wound bed without slough. 1.5 x 1  (Active)  02/19/23 0500  Location: Buttocks  Location Orientation: Left  Staging: Stage 2 -  Partial thickness loss of dermis presenting as a shallow open injury with a red, pink wound bed without slough.  Wound Description (Comments): 1.5 x 1  Present on Admission: Yes (as per admitting nurse)  Dressing Type Foam - Lift dressing to assess site every shift 02/23/23 0921             Data Reviewed: I have personally reviewed following labs and imaging studies  CBC: Recent Labs  Lab 02/17/23 1735 02/18/23 0634 02/19/23 0255 02/20/23 1125 02/21/23 0557 02/21/23 1222 02/22/23 0423 02/22/23 1114 02/23/23 0434  WBC 16.6*   < > 9.8 7.5 4.9  --  5.1  --  5.4  NEUTROABS 14.4*  --   --   --   --   --   --   --   --   HGB 3.8*   < > 8.4* 8.2* 7.7* 8.4* 7.1* 7.7* 11.2*  HCT 12.6*   < > 24.7* 24.7* 24.0* 26.4* 22.9* 24.1* 33.5*  MCV 102.4*   < > 88.5 91.5 93.8  --  97.0  --  91.8  PLT 367   < > 208 201 177  --  162  --  155   < > = values in this interval not displayed.   Basic Metabolic Panel: Recent Labs  Lab 02/19/23 0255 02/20/23 1125 02/21/23 0557 02/22/23 0423 02/23/23 0434  NA 134* 133* 134* 135 136  K 4.0 3.8 3.9 4.0 3.6  CL 102 103 105 108 108  CO2 22 20* 20* 18* 19*  GLUCOSE 75 219* 175* 118* 113*  BUN 112* 101* 100* 92* 83*  CREATININE 3.27* 2.83* 2.75* 2.50* 2.36*  CALCIUM 7.0* 7.4* 7.7* 7.6* 8.0*  MG 1.3*  --   --   --   --    GFR: Estimated Creatinine Clearance: 19.2 mL/min (A) (by C-G formula based on SCr of 2.36 mg/dL (H)). Liver Function Tests: Recent Labs  Lab 02/17/23 1604  AST 25  ALT 11  ALKPHOS 94  BILITOT 0.6  PROT 4.3*  ALBUMIN 1.6*   Recent Labs  Lab 02/17/23 1735  LIPASE 21    HbA1C: No results for input(s): "  HGBA1C" in the last 72 hours.  CBG: Recent Labs  Lab 02/22/23 2016 02/22/23 2259 02/23/23 0106 02/23/23 0450 02/23/23 0811  GLUCAP 191* 139* 131* 127* 115*     Recent Results (from the past 240 hour(s))  MRSA  Next Gen by PCR, Nasal     Status: Abnormal   Collection Time: 02/17/23  7:35 PM   Specimen: Nasal Mucosa; Nasal Swab  Result Value Ref Range Status   MRSA by PCR Next Gen DETECTED (A) NOT DETECTED Final    Comment: RESULT CALLED TO, READ BACK BY AND VERIFIED WITH: KING J @ 0747 ON [id number] BY HENDERSON L (NOTE) The GeneXpert MRSA Assay (FDA approved for NASAL specimens only), is one component of a comprehensive MRSA colonization surveillance program. It is not intended to diagnose MRSA infection nor to guide or monitor treatment for MRSA infections. Test performance is not FDA approved in patients less than 3 years old. Performed at [facility], [address], [place], [place] [address]   Urine Culture (for pregnant, neutropenic or urologic patients or patients with an indwelling urinary catheter)     Status: Abnormal   Collection Time: 02/20/23  3:20 PM   Specimen: Urine, Clean Catch  Result Value Ref Range Status   Specimen Description   Final    URINE, CLEAN CATCH Performed at [facility], [address], [place], [place] [address]    Special Requests   Final    NONE Performed at [facility], [address], [place], [place] [address]    Culture (A)  Final    <10,000 COLONIES/mL INSIGNIFICANT GROWTH Performed at [facility], [address], [place], [place] [address]    Report Status 02/21/2023 FINAL  Final         Radiology Studies: No results found.      Scheduled Meds:  (feeding supplement) PROSource Plus  30 mL Oral TID BM   sodium chloride   Intravenous Once   sodium chloride   Intravenous Once   acetaminophen  650 mg Oral Once   acidophilus  1 capsule Oral TID   allopurinol  100 mg Oral Daily   atorvastatin  80 mg Oral QHS   Chlorhexidine Gluconate Cloth  6 each Topical Q0600   diphenhydrAMINE  25 mg Oral Once   insulin aspart  0-6 Units Subcutaneous Q4H   levETIRAcetam  500 mg Oral BID   lipase/protease/amylase  36,000 Units Oral TID WC    metroNIDAZOLE  500 mg Oral Q8H   multivitamin-lutein  1 capsule Oral Daily   mupirocin ointment   Nasal BID   pantoprazole  40 mg Intravenous Q12H   sodium bicarbonate  650 mg Oral Daily   sucralfate  1 g Oral TID WC & HS   venlafaxine XR  150 mg Oral Daily   Continuous Infusions:  cefTRIAXone (ROCEPHIN)  IV 2 g (02/22/23 2101)     LOS: 6 days    Time spent: 55 minutes    Kendell Bane, MD  [facility]  If 7PM-7AM, please contact night-coverage [email or web address] 02/23/2023, 11:31 AM

## 2023-02-23 NOTE — Progress Notes (Signed)
Patient complaining of some persistent periumbilical pain.  Had a couple of dark stools overnight.  Patient received 2 units of blood since yesterday morning confirmed by nursing staff.  Hemoglobin rebounded 7.7-11.2 this morning  PPI not resumed after patient returned from endoscopy yesterday  She refuses  Carafate slurry's  Vital signs in last 24 hours: Temp:  [97.4 F (36.3 C)-98.6 F (37 C)] 97.8 F (36.6 C) (04/27 0649) Pulse Rate:  [106-132] 110 (04/27 0649) Resp:  [13-23] 18 (04/27 0649) BP: (104-167)/(62-88) 140/72 (04/27 0649) SpO2:  [100 %] 100 % (04/27 0649) Weight:  [55.9 kg] 55.9 kg (04/26 1400) Last BM Date : 02/21/23 General:   Alert,   pleasant and cooperative in NAD Abdomen: Nondistended.  Positive bowel sounds mild epigastric tenderness to palpation Extremities:  Without clubbing or edema.    Intake/Output from previous day: 04/26 0701 - 04/27 0700 In: 1683.4 [I.V.:1000; Blood:683.4] Out: 300 [Urine:300] Intake/Output this shift: No intake/output data recorded.  Lab Results: Recent Labs    02/21/23 0557 02/21/23 1222 02/22/23 0423 02/22/23 1114 02/23/23 0434  WBC 4.9  --  5.1  --  5.4  HGB 7.7*   < > 7.1* 7.7* 11.2*  HCT 24.0*   < > 22.9* 24.1* 33.5*  PLT 177  --  162  --  155   < > = values in this interval not displayed.   BMET Recent Labs    02/21/23 0557 02/22/23 0423 02/23/23 0434  NA 134* 135 136  K 3.9 4.0 3.6  CL 105 108 108  CO2 20* 18* 19*  GLUCOSE 175* 118* 113*  BUN 100* 92* 83*  CREATININE 2.75* 2.50* 2.36*  CALCIUM 7.7* 7.6* 8.0*   LFT No results for input(s): "PROT", "ALBUMIN", "AST", "ALT", "ALKPHOS", "BILITOT", "BILIDIR", "IBILI" in the last 72 hours. PT/INR No results for input(s): "LABPROT", "INR" in the last 72 hours. Hepatitis Panel No results for input(s): "HEPBSAG", "HCVAB", "HEPAIGM", "HEPBIGM" in the last 72 hours. C-Diff No results for input(s): "CDIFFTOX" in the last 72 hours.  Studies/Results: No  results found.  Impression:72 year old female with history of small bowel Crohn's disease diagnosed in January 2022 currently on no specific treatment.  Hypertriglyceridemia and use of Victoza, CKD, depression, diabetes, GERD, anxiety, IBS, hyperlipidemia, stroke, presented to the ED Sunday and reported dark red bleeding per husband and found to have profound anemia with hemoglobin of 3.2.  Admitted with acute symptomatic blood loss anemia in the setting of GI bleed, proctocolitis, acute renal failure on CKD, acute metabolic encephalopathy but improved following blood transfusion.   Patient displayed recurrent bleeding due to a visible vessel in the duodenal ulcer bed requiring repeat EGD with epinephrine injection and hemostasis clip placement yesterday.  Stable overnight.  Still with some abdominal pain.  PPI therapy interrupted overnight for reasons which are not clear.  Patient refuses to take Carafate slurry's.  She is agreeable to taking Carafate in tablet form.  Recommendations: Twice daily PPI therapy  - Keep on a clear liquid diet  - Trend H&H  -Carafate tablets AC and nightly (should be efficacious with the bleeding lesions in the duodenum)  - Reassess tomorrow morning.

## 2023-02-23 NOTE — TOC Progression Note (Signed)
Transition of Care [facility]) - Progression Note    Patient Details  Name: Heather [name] MRN: [id number] Date of Birth: 17-Jan-1951  Transition of Care [facility]) CM/SW Contact  Catalina [place], [place] Phone Number: 02/23/2023, 2:45 PM  Clinical Narrative:    Pt has PT evaluation pending.active with [facility]Wants PT with Frances Furbish to continue.DC 1-2 days.     Barriers to Discharge: Continued Medical Work up  Expected Discharge Plan and Services       Living arrangements for the past 2 months: Single Family Home                                       Social Determinants of Health (SDOH) Interventions SDOH Screenings   Food Insecurity: No Food Insecurity (02/17/2023)  Housing: Low Risk  (02/17/2023)  Transportation Needs: No Transportation Needs (02/17/2023)  Utilities: Not At Risk (02/17/2023)  Tobacco Use: Low Risk  (02/22/2023)    Readmission Risk Interventions    02/06/2023    8:28 AM 11/14/2022   11:31 AM 11/12/2022    4:06 PM  Readmission Risk Prevention Plan  Transportation Screening Complete  Complete  Medication Review (RN Care Manager) Complete  Complete  PCP or Specialist appointment within 3-5 days of discharge  Complete Not Complete  HRI or Home Care Consult Complete  Complete  SW Recovery Care/Counseling Consult Complete  Complete  Palliative Care Screening Not Applicable  Not Applicable  Skilled Nursing Facility Not Applicable  Complete

## 2023-02-23 NOTE — Progress Notes (Signed)
Patient states that she is burning when she urinates. MD Shahmehdi notified.

## 2023-02-23 NOTE — Progress Notes (Signed)
Patient had two bowel movements this morning that were black and tarry. MD Reeves Forth and MD Rourk notified.

## 2023-02-24 DIAGNOSIS — K5229 Other allergic and dietetic gastroenteritis and colitis: Secondary | ICD-10-CM | POA: Diagnosis not present

## 2023-02-24 DIAGNOSIS — K921 Melena: Secondary | ICD-10-CM | POA: Diagnosis not present

## 2023-02-24 DIAGNOSIS — K264 Chronic or unspecified duodenal ulcer with hemorrhage: Secondary | ICD-10-CM | POA: Diagnosis not present

## 2023-02-24 DIAGNOSIS — K2210 Ulcer of esophagus without bleeding: Secondary | ICD-10-CM | POA: Diagnosis not present

## 2023-02-24 DIAGNOSIS — D62 Acute posthemorrhagic anemia: Secondary | ICD-10-CM | POA: Diagnosis not present

## 2023-02-24 LAB — GLUCOSE, CAPILLARY
Glucose-Capillary: 112 mg/dL — ABNORMAL HIGH (ref 70–99)
Glucose-Capillary: 114 mg/dL — ABNORMAL HIGH (ref 70–99)
Glucose-Capillary: 164 mg/dL — ABNORMAL HIGH (ref 70–99)
Glucose-Capillary: 201 mg/dL — ABNORMAL HIGH (ref 70–99)
Glucose-Capillary: 67 mg/dL — ABNORMAL LOW (ref 70–99)
Glucose-Capillary: 99 mg/dL (ref 70–99)
Glucose-Capillary: 99 mg/dL (ref 70–99)

## 2023-02-24 LAB — CBC
HCT: 33.6 % — ABNORMAL LOW (ref 36.0–46.0)
Hemoglobin: 11.2 g/dL — ABNORMAL LOW (ref 12.0–15.0)
MCH: 30.5 pg (ref 26.0–34.0)
MCHC: 33.3 g/dL (ref 30.0–36.0)
MCV: 91.6 fL (ref 80.0–100.0)
Platelets: 191 10*3/uL (ref 150–400)
RBC: 3.67 MIL/uL — ABNORMAL LOW (ref 3.87–5.11)
RDW: 17 % — ABNORMAL HIGH (ref 11.5–15.5)
WBC: 7.6 10*3/uL (ref 4.0–10.5)
nRBC: 0 % (ref 0.0–0.2)

## 2023-02-24 LAB — URINALYSIS, ROUTINE W REFLEX MICROSCOPIC
Bilirubin Urine: NEGATIVE
Glucose, UA: NEGATIVE mg/dL
Ketones, ur: 5 mg/dL — AB
Nitrite: POSITIVE — AB
Protein, ur: 100 mg/dL — AB
Specific Gravity, Urine: 1.015 (ref 1.005–1.030)
WBC, UA: >50 WBC/hpf (ref 0–5)
pH: 5 (ref 5.0–8.0)

## 2023-02-24 LAB — TYPE AND SCREEN
ABO/RH(D): O POS
Antibody Screen: NEGATIVE

## 2023-02-24 LAB — BPAM RBC
Blood Product Expiration Date: 202405312359
Blood Product Expiration Date: 202406012359
ISSUE DATE / TIME: 202404262344
Unit Type and Rh: 5100
Unit Type and Rh: 5100

## 2023-02-24 LAB — BASIC METABOLIC PANEL
Anion gap: 8 (ref 5–15)
BUN: 82 mg/dL — ABNORMAL HIGH (ref 8–23)
CO2: 19 mmol/L — ABNORMAL LOW (ref 22–32)
Calcium: 8.1 mg/dL — ABNORMAL LOW (ref 8.9–10.3)
Chloride: 110 mmol/L (ref 98–111)
Creatinine, Ser: 2.33 mg/dL — ABNORMAL HIGH (ref 0.44–1.00)
GFR, Estimated: 22 mL/min — ABNORMAL LOW (ref >60)
Glucose, Bld: 79 mg/dL (ref 70–99)
Potassium: 3.9 mmol/L (ref 3.5–5.1)
Sodium: 137 mmol/L (ref 135–145)

## 2023-02-24 MED ORDER — PANTOPRAZOLE SODIUM 40 MG PO TBEC
40.0000 mg | DELAYED_RELEASE_TABLET | Freq: Two times a day (BID) | ORAL | Status: DC
  Administered 2023-02-24: 40 mg via ORAL
  Filled 2023-02-24 (×3): qty 1

## 2023-02-24 MED ORDER — ONDANSETRON 4 MG PO TBDP
4.0000 mg | ORAL_TABLET | Freq: Three times a day (TID) | ORAL | Status: DC | PRN
  Administered 2023-02-24: 4 mg via ORAL
  Filled 2023-02-24: qty 1

## 2023-02-24 MED ORDER — FLUCONAZOLE 100 MG PO TABS
200.0000 mg | ORAL_TABLET | Freq: Once | ORAL | Status: AC
  Administered 2023-02-24: 200 mg via ORAL
  Filled 2023-02-24: qty 2

## 2023-02-24 MED ORDER — PROCHLORPERAZINE EDISYLATE 10 MG/2ML IJ SOLN
10.0000 mg | Freq: Four times a day (QID) | INTRAMUSCULAR | Status: DC | PRN
  Administered 2023-02-24: 10 mg via INTRAVENOUS
  Filled 2023-02-24: qty 2

## 2023-02-24 NOTE — Progress Notes (Signed)
Patient with less abdominal pain.  No melena overnight.  Hemoglobin holding steady at 11.2.  Wants to go home.  Temp:  [97.8 F (36.6 C)-98.4 F (36.9 C)] 97.8 F (36.6 C) (04/28 0423) Pulse Rate:  [89-122] 96 (04/28 0423) Resp:  [14-20] 15 (04/28 0423) BP: (115-153)/(58-93) 133/58 (04/28 0423) SpO2:  [92 %-100 %] 100 % (04/28 0423) Last BM Date : 02/21/23 General:   Alert,  Well-developed,  pleasant and cooperative in NAD Abdomen: Nondistended.  Positive bowel sounds soft and nontender.   Extremities:  Without clubbing or edema.    Intake/Output from previous day: 04/27 0701 - 04/28 0700 In: 763.7 [P.O.:200; I.V.:337.1; IV Piggyback:226.6] Out: -  Intake/Output this shift: Total I/O In: 120 [P.O.:120] Out: -   Lab Results: Recent Labs    02/22/23 0423 02/22/23 1114 02/23/23 0434 02/24/23 0452  WBC 5.1  --  5.4 7.6  HGB 7.1* 7.7* 11.2* 11.2*  HCT 22.9* 24.1* 33.5* 33.6*  PLT 162  --  155 191   BMET Recent Labs    02/22/23 0423 02/23/23 0434 02/24/23 0452  NA 135 136 137  K 4.0 3.6 3.9  CL 108 108 110  CO2 18* 19* 19*  GLUCOSE 118* 113* 79  BUN 92* 83* 82*  CREATININE 2.50* 2.36* 2.33*  CALCIUM 7.6* 8.0* 8.1*   LFT No results for input(s): "PROT", "ALBUMIN", "AST", "ALT", "ALKPHOS", "BILITOT", "BILIDIR", "IBILI" in the last 72 hours. PT/INR No results for input(s): "LABPROT", "INR" in the last 72 hours. Hepatitis Panel No results for input(s): "HEPBSAG", "HCVAB", "HEPAIGM", "HEPBIGM" in the last 72 hours. C-Diff No results for input(s): "CDIFFTOX" in the last 72 hours.  Studies/Results: No results found.  Assessment: Principal Problem:   GI bleed Active Problems:   Essential hypertension   Gout   Melena   Diabetes mellitus (HCC)   Visual disturbance   Acute renal failure superimposed on stage 4 chronic kidney disease (HCC)   Acute metabolic encephalopathy   Cerebrovascular accident (CVA) due to embolism of cerebral artery (HCC)   Acute on  chronic anemia   UTI (urinary tract infection)   Pancreatic lesion   Proctocolitis   Hypoalbuminemia   Rectal bleeding   ABLA (acute blood loss anemia)   PUD (peptic ulcer disease)  Impression: 72 year old lady with multiple comorbidities including reported small bowel Crohn's disease admitted to the hospital with upper GI bleeding in the setting of aspirin powder/NSAID use.  EGD this admission demonstrated severe esophagitis duodenal ulceration.  Biopsies revealed Non-H. pylori gastritis, acute nonspecific esophagitis and duodenitis.  Patient displayed evidence of recurrent bleeding requiring follow-up EGD which revealed distinct visible vessel in the duodenal ulcer bed.  It was treated with epinephrine and additional clip placement.  Clinically no further bleeding.  Hemoglobin has held steady now for 48 hours.   Recommendations:  -Advance to a carbohydrate modified diet  -Repeat H&H tomorrow morning  -Repeat EGD in about 12 weeks.  -Twice daily PPI for at least 12 weeks  -Carafate 1 g tablets 1 before meals and nightly x 10 days postdischarge.  -No MRI until clips gone.  -Avoid all NSAIDs for now.  -Observe another 24 hours prior to considering discharge.

## 2023-02-24 NOTE — Progress Notes (Addendum)
PROGRESS NOTE    Heather [name]  ZOX:[id number] DOB: 1951-09-10 DOA: 02/17/2023 PCP: [name], MD   Brief Narrative:    Heather [name] is a 72 y.o. female with medical history significant for diabetes mellitus, Crohn's disease, CKD 4, diastolic CHF, pulmonary hypertension, gout. Patient presented to the ED with reports of dark red stools. Per notes, patient was confused in triage.  After transfusion in ED, mental status improved.  Patient was admitted with symptomatic acute blood loss anemia in the setting of active GI bleed.  She has had 3 unit PRBC transfusion with improvement in hemoglobin levels and underwent GI evaluation with EGD and flex sigmoidoscopy on 4/23 with findings of esophageal ulcers and duodenal ulcers with oozing hemorrhage requiring clips.  Biopsies also taken for possible Crohn's.  GI recommending clear liquid diet and Protonix infusion for 72 hours.  Subjective: Heather [name] was seen and examined this morning, stable no acute distress, mildly somnolent... Mild agitation overnight Hemodynamically stable  Let us post EGD and blood transfusion 12/24/2022   Assessment & Plan:   Principal Problem:   GI bleed Active Problems:   Acute renal failure superimposed on stage 4 chronic kidney disease (HCC)   Acute metabolic encephalopathy   Acute on chronic anemia   Pancreatic lesion   Proctocolitis   Hypoalbuminemia   Diabetes mellitus (HCC)   Essential hypertension   UTI (urinary tract infection)   Gout   Melena   Visual disturbance   Cerebrovascular accident (CVA) due to embolism of cerebral artery (HCC)   Rectal bleeding   ABLA (acute blood loss anemia)   PUD (peptic ulcer disease)  Assessment and Plan:  Acute symptomatic blood loss anemia secondary to GI bleed -With acute on chronic anemia.   - POA: Hgb was down to 3.2, recent hemoglobin/14 on discharge  -Thus far patient received 1 unit PRBC on arrival + subsequently 3 more units .   -  Positive NSAID use history. Not on anticoagulation.   - 10/2020, capsule endoscopy-several small aphthous appearing ulcers scattered in small bowel,-mild IBD/Crohn's.  -Colonoscopy and EGD 2021 were unremarkable.  -EGD and flex sigmoidoscopy 02/19/2023  S/p PPI drip for total 72 hours as well as clear liquid diet       Latest Ref Rng & Units 02/24/2023    4:52 AM 02/23/2023    4:34 AM 02/22/2023   11:14 AM  CBC  WBC 4.0 - 10.5 K/uL 7.6  5.4    Hemoglobin 12.0 - 15.0 g/dL 81.1  91.4  7.7   Hematocrit 36.0 - 46.0 % 33.6  33.5  24.1   Platelets 150 - 400 K/uL 191  155     -02/22/2023 -  2 more units of PRBC transfusion, and repeat EGD  - Status post EGD 12/24/2022 Bleeding duodenal ulcer was identified, heparin was injected and hemostasis was achieved by clipping   Proctocolitis -Stable, POA bloody stool, history of Crohn disease, leukocytosis, elevated ESR at >130.  Serum ferritin 1462.  Rules out for sepsis.   - CT abdomen and pelvis without contrast - Mild bowel wall thickening and mucosal enhancement involving portions of the sigmoid colon and rectum may reflect proctocolitis. -On IV ceftriaxone and metronidazole --- anticipating switching antibiotics to p.o.  (Listed allergies and intolerances to sulfa and quinolones Abxs)  -Follow-up GI recs     Pancreatic lesion Incidental finding on CT today 02/17/2023-  Low-density pancreatic lesion. When the patient is clinically stable and able to follow directions and hold their  breath (preferably as an outpatient) further evaluation with dedicated abdominal MRI could be considered for further characterization. -Follow-up as outpatient   Acute metabolic encephalopathy Mentation improved  - mental status is improved after PRBC.  Encephalopathy likely due to severe anemia, Proctocolitis. UTI  -Resume home Keppra, venlafaxine    Acute renal failure superimposed on stage 4 chronic kidney disease AKI on CKD stage IV.  Cr- 3.26, last  discharge creatinine 4/14- 1.8. In the setting of severe anemia GI bleed.  -Continue daily oral bicarbonate/ IVF  Lab Results  Component Value Date   CREATININE 2.33 (H) 02/24/2023   CREATININE 2.36 (H) 02/23/2023   CREATININE 2.50 (H) 02/22/2023       Hypoalbuminemia Albumin 1.6.  Calcium 7.5, corrected calcium - 9.4.   Diabetes mellitus - HgbA1c 6.4 - SSI-    Essential hypertension -BP trending low  -Will use as needed IV labetalol 5mg  SBP>  180 -Hold home antihypertensives metoprolol 37.5mg     Urinary tract infection  -Symptomatic with dysuria -UA revealed large leukocyte esterase, >50 WBCs, will follow-up with cultures <10 K colonies insignificant growth -Current antibiotics of Rocephin   Gout Resume allopurinol  Screening nasal MRSA positive -Continue Bactroban x 5 days   DVT prophylaxis: SCDs Code Status: Full Family Communication: None at bedside.  Called patient's husband Mr. Atha discussed plan of care updated today 02/23/2023  Disposition Plan: GI evaluation Status is: Inpatient Remains inpatient appropriate because: Need for IV medications.  Consultants:  GI  Procedures:  EGD and flex sigmoidoscopy 4/23  Antimicrobials:  Anti-infectives (From admission, onward)    Start     Dose/Rate Route Frequency Ordered Stop   02/24/23 0830  fluconazole (DIFLUCAN) tablet 200 mg        200 mg Oral  Once 02/24/23 0740 02/24/23 0842   02/21/23 1400  metroNIDAZOLE (FLAGYL) tablet 500 mg        500 mg Oral Every 8 hours 02/21/23 1053     02/17/23 2200  cefTRIAXone (ROCEPHIN) 2 g in sodium chloride 0.9 % 100 mL IVPB        2 g 200 mL/hr over 30 Minutes Intravenous Every 24 hours 02/17/23 2155     02/17/23 2200  metroNIDAZOLE (FLAGYL) IVPB 500 mg  Status:  Discontinued        500 mg 100 mL/hr over 60 Minutes Intravenous Every 12 hours 02/17/23 2155 02/21/23 1053        Objective: Vitals:   02/23/23 1703 02/23/23 1910 02/23/23 2358 02/24/23 0423  BP:  (!) 145/69 139/70 (!) 153/68 (!) 133/58  Pulse: (!) 117 100 89 96  Resp: 19 15 14 15   Temp: 98.2 F (36.8 C) 98.1 F (36.7 C) 98.2 F (36.8 C) 97.8 F (36.6 C)  TempSrc:  Oral Axillary Axillary  SpO2: 100% 92% 100% 100%  Weight:      Height:        Intake/Output Summary (Last 24 hours) at 02/24/2023 1051 Last data filed at 02/24/2023 0900 Gross per 24 hour  Intake 883.7 ml  Output --  Net 883.7 ml   Filed Weights   02/19/23 0951 02/20/23 0504 02/22/23 1400  Weight: 55 kg 55.9 kg 55.9 kg         General:  AAO x 3,  cooperative, no distress; mildly somnolent  HEENT:  Normocephalic, PERRL, otherwise with in Normal limits   Neuro:  CNII-XII intact. , normal motor and sensation, reflexes intact   Lungs:   Clear to auscultation BL, Respirations unlabored,  No wheezes / crackles  Cardio:    S1/S2, RRR, No murmure, No Rubs or Gallops   Abdomen:  Soft, non-tender, bowel sounds active all four quadrants, no guarding or peritoneal signs.  Muscular  skeletal:  Limited exam -global generalized weaknesses - in bed, able to move all 4 extremities,   2+ pulses,  symmetric, No pitting edema  Skin:  Dry, warm to touch, negative for any Rashes,  Wounds: Please see nursing documentation  Pressure Injury 02/19/23 Buttocks Right Stage 2 -  Partial thickness loss of dermis presenting as a shallow open injury with a red, pink wound bed without slough. 1 x 0.8 (Active)  02/19/23 0500  Location: Buttocks  Location Orientation: Right  Staging: Stage 2 -  Partial thickness loss of dermis presenting as a shallow open injury with a red, pink wound bed without slough.  Wound Description (Comments): 1 x 0.8  Present on Admission: Yes (as per admitting nurse)  Dressing Type Foam - Lift dressing to assess site every shift 02/24/23 0900     Pressure Injury 02/19/23 Buttocks Left Stage 2 -  Partial thickness loss of dermis presenting as a shallow open injury with a red, pink wound bed without slough.  1.5 x 1 (Active)  02/19/23 0500  Location: Buttocks  Location Orientation: Left  Staging: Stage 2 -  Partial thickness loss of dermis presenting as a shallow open injury with a red, pink wound bed without slough.  Wound Description (Comments): 1.5 x 1  Present on Admission: Yes (as per admitting nurse)  Dressing Type Foam - Lift dressing to assess site every shift 02/24/23 0900                Data Reviewed: I have personally reviewed following labs and imaging studies  CBC: Recent Labs  Lab 02/17/23 1735 02/18/23 0634 02/20/23 1125 02/21/23 0557 02/21/23 1222 02/22/23 0423 02/22/23 1114 02/23/23 0434 02/24/23 0452  WBC 16.6*   < > 7.5 4.9  --  5.1  --  5.4 7.6  NEUTROABS 14.4*  --   --   --   --   --   --   --   --   HGB 3.8*   < > 8.2* 7.7* 8.4* 7.1* 7.7* 11.2* 11.2*  HCT 12.6*   < > 24.7* 24.0* 26.4* 22.9* 24.1* 33.5* 33.6*  MCV 102.4*   < > 91.5 93.8  --  97.0  --  91.8 91.6  PLT 367   < > 201 177  --  162  --  155 191   < > = values in this interval not displayed.   Basic Metabolic Panel: Recent Labs  Lab 02/19/23 0255 02/20/23 1125 02/21/23 0557 02/22/23 0423 02/23/23 0434 02/24/23 0452  NA 134* 133* 134* 135 136 137  K 4.0 3.8 3.9 4.0 3.6 3.9  CL 102 103 105 108 108 110  CO2 22 20* 20* 18* 19* 19*  GLUCOSE 75 219* 175* 118* 113* 79  BUN 112* 101* 100* 92* 83* 82*  CREATININE 3.27* 2.83* 2.75* 2.50* 2.36* 2.33*  CALCIUM 7.0* 7.4* 7.7* 7.6* 8.0* 8.1*  MG 1.3*  --   --   --   --   --    GFR: Estimated Creatinine Clearance: 19.4 mL/min (A) (by C-G formula based on SCr of 2.33 mg/dL (H)). Liver Function Tests: Recent Labs  Lab 02/17/23 1604  AST 25  ALT 11  ALKPHOS 94  BILITOT 0.6  PROT 4.3*  ALBUMIN 1.6*  Recent Labs  Lab 02/17/23 1735  LIPASE 21    HbA1C: No results for input(s): "HGBA1C" in the last 72 hours.  CBG: Recent Labs  Lab 02/23/23 2354 02/24/23 0000 02/24/23 0413 02/24/23 0545 02/24/23 0729  GLUCAP 111* 114* 67*  99 99     Recent Results (from the past 240 hour(s))  MRSA Next Gen by PCR, Nasal     Status: Abnormal   Collection Time: 02/17/23  7:35 PM   Specimen: Nasal Mucosa; Nasal Swab  Result Value Ref Range Status   MRSA by PCR Next Gen DETECTED (A) NOT DETECTED Final    Comment: RESULT CALLED TO, READ BACK BY AND VERIFIED WITH: KING J @ 0747 ON [id number] BY HENDERSON L (NOTE) The GeneXpert MRSA Assay (FDA approved for NASAL specimens only), is one component of a comprehensive MRSA colonization surveillance program. It is not intended to diagnose MRSA infection nor to guide or monitor treatment for MRSA infections. Test performance is not FDA approved in patients less than 74 years old. Performed at [facility], [address], [place], [place] [address]   Urine Culture (for pregnant, neutropenic or urologic patients or patients with an indwelling urinary catheter)     Status: Abnormal   Collection Time: 02/20/23  3:20 PM   Specimen: Urine, Clean Catch  Result Value Ref Range Status   Specimen Description   Final    URINE, CLEAN CATCH Performed at [facility], [address] [place], [place] [address]    Special Requests   Final    NONE Performed at [facility], [address], [place], [place] [address]    Culture (A)  Final    <10,000 COLONIES/mL INSIGNIFICANT GROWTH Performed at [facility], [address], [place], [place] [address]    Report Status 02/21/2023 FINAL  Final         Radiology Studies: No results found.      Scheduled Meds:  (feeding supplement) PROSource Plus  30 mL Oral TID BM   sodium chloride   Intravenous Once   acetaminophen  650 mg Oral Once   acidophilus  1 capsule Oral TID   allopurinol  100 mg Oral Daily   atorvastatin  80 mg Oral QHS   Chlorhexidine Gluconate Cloth  6 each Topical Q0600   insulin aspart  0-6 Units Subcutaneous Q4H   levETIRAcetam  500 mg Oral BID   lipase/protease/amylase  36,000 Units Oral TID WC    metroNIDAZOLE  500 mg Oral Q8H   multivitamin-lutein  1 capsule Oral Daily   mupirocin ointment   Nasal BID   pantoprazole  40 mg Oral BID   phenazopyridine  200 mg Oral TID WC   sodium bicarbonate  650 mg Oral Daily   sucralfate  1 g Oral TID WC & HS   venlafaxine XR  150 mg Oral Daily   Continuous Infusions:  cefTRIAXone (ROCEPHIN)  IV 2 g (02/23/23 2151)     LOS: 7 days    Time spent: 55 minutes    Kendell Bane, MD  [facility]  If 7PM-7AM, please contact night-coverage [email or web address] 02/24/2023, 10:51 AM

## 2023-02-24 NOTE — Plan of Care (Signed)
Patient AOX3, disoriented to time.  VSS throughout shift.  All meds given on time as ordered.  All meds given on time as ordered.  Denied pain and SOB.  Diminished lungs, IS encouraged.  Pt had BG 67, asymptomatic.  Cola given, BG 99 upon recheck.  Provider notified.  Pt also had multiple tarry black stools.  Provider notified.  Full linen changed and CHG bath given.  POC maintained, will continue to monitor.  Problem: Education: Goal: Knowledge of General Education information will improve Description: Including pain rating scale, medication(s)/side effects and non-pharmacologic comfort measures Outcome: Progressing   Problem: Health Behavior/Discharge Planning: Goal: Ability to manage health-related needs will improve Outcome: Progressing   Problem: Clinical Measurements: Goal: Ability to maintain clinical measurements within normal limits will improve Outcome: Progressing Goal: Will remain free from infection Outcome: Progressing Goal: Diagnostic test results will improve Outcome: Progressing Goal: Respiratory complications will improve Outcome: Progressing Goal: Cardiovascular complication will be avoided Outcome: Progressing   Problem: Activity: Goal: Risk for activity intolerance will decrease Outcome: Progressing   Problem: Nutrition: Goal: Adequate nutrition will be maintained Outcome: Progressing   Problem: Coping: Goal: Level of anxiety will decrease Outcome: Progressing   Problem: Elimination: Goal: Will not experience complications related to bowel motility Outcome: Progressing Goal: Will not experience complications related to urinary retention Outcome: Progressing   Problem: Pain Managment: Goal: General experience of comfort will improve Outcome: Progressing   Problem: Safety: Goal: Ability to remain free from injury will improve Outcome: Progressing   Problem: Skin Integrity: Goal: Risk for impaired skin integrity will decrease Outcome: Progressing    Problem: Education: Goal: Ability to describe self-care measures that may prevent or decrease complications (Diabetes Survival Skills Education) will improve Outcome: Progressing Goal: Individualized Educational Video(s) Outcome: Progressing   Problem: Coping: Goal: Ability to adjust to condition or change in health will improve Outcome: Progressing   Problem: Fluid Volume: Goal: Ability to maintain a balanced intake and output will improve Outcome: Progressing   Problem: Health Behavior/Discharge Planning: Goal: Ability to identify and utilize available resources and services will improve Outcome: Progressing Goal: Ability to manage health-related needs will improve Outcome: Progressing   Problem: Metabolic: Goal: Ability to maintain appropriate glucose levels will improve Outcome: Progressing   Problem: Nutritional: Goal: Maintenance of adequate nutrition will improve Outcome: Progressing Goal: Progress toward achieving an optimal weight will improve Outcome: Progressing   Problem: Skin Integrity: Goal: Risk for impaired skin integrity will decrease Outcome: Progressing   Problem: Tissue Perfusion: Goal: Adequacy of tissue perfusion will improve Outcome: Progressing

## 2023-02-25 ENCOUNTER — Telehealth: Payer: Self-pay | Admitting: Gastroenterology

## 2023-02-25 ENCOUNTER — Other Ambulatory Visit: Payer: Self-pay

## 2023-02-25 DIAGNOSIS — K264 Chronic or unspecified duodenal ulcer with hemorrhage: Secondary | ICD-10-CM

## 2023-02-25 DIAGNOSIS — N186 End stage renal disease: Secondary | ICD-10-CM | POA: Diagnosis not present

## 2023-02-25 DIAGNOSIS — D62 Acute posthemorrhagic anemia: Secondary | ICD-10-CM | POA: Diagnosis not present

## 2023-02-25 DIAGNOSIS — K5229 Other allergic and dietetic gastroenteritis and colitis: Secondary | ICD-10-CM | POA: Diagnosis not present

## 2023-02-25 DIAGNOSIS — K922 Gastrointestinal hemorrhage, unspecified: Secondary | ICD-10-CM | POA: Diagnosis not present

## 2023-02-25 LAB — BASIC METABOLIC PANEL
Anion gap: 11 (ref 5–15)
BUN: 78 mg/dL — ABNORMAL HIGH (ref 8–23)
CO2: 17 mmol/L — ABNORMAL LOW (ref 22–32)
Calcium: 8 mg/dL — ABNORMAL LOW (ref 8.9–10.3)
Chloride: 111 mmol/L (ref 98–111)
Creatinine, Ser: 2.43 mg/dL — ABNORMAL HIGH (ref 0.44–1.00)
GFR, Estimated: 21 mL/min — ABNORMAL LOW (ref >60)
Glucose, Bld: 208 mg/dL — ABNORMAL HIGH (ref 70–99)
Potassium: 3.6 mmol/L (ref 3.5–5.1)
Sodium: 139 mmol/L (ref 135–145)

## 2023-02-25 LAB — CBC
HCT: 29.6 % — ABNORMAL LOW (ref 36.0–46.0)
Hemoglobin: 9.6 g/dL — ABNORMAL LOW (ref 12.0–15.0)
MCH: 31.4 pg (ref 26.0–34.0)
MCHC: 32.4 g/dL (ref 30.0–36.0)
MCV: 96.7 fL (ref 80.0–100.0)
Platelets: 196 10*3/uL (ref 150–400)
RBC: 3.06 MIL/uL — ABNORMAL LOW (ref 3.87–5.11)
RDW: 17.5 % — ABNORMAL HIGH (ref 11.5–15.5)
WBC: 9.2 10*3/uL (ref 4.0–10.5)
nRBC: 0 % (ref 0.0–0.2)

## 2023-02-25 LAB — GLUCOSE, CAPILLARY
Glucose-Capillary: 186 mg/dL — ABNORMAL HIGH (ref 70–99)
Glucose-Capillary: 203 mg/dL — ABNORMAL HIGH (ref 70–99)
Glucose-Capillary: 212 mg/dL — ABNORMAL HIGH (ref 70–99)
Glucose-Capillary: 239 mg/dL — ABNORMAL HIGH (ref 70–99)

## 2023-02-25 MED ORDER — PROSOURCE PLUS PO LIQD: 30.0000 mL | Freq: Three times a day (TID) | ORAL | 0 refills | Status: AC

## 2023-02-25 MED ORDER — ESOMEPRAZOLE MAGNESIUM 40 MG PO CPDR: 40.0000 mg | DELAYED_RELEASE_CAPSULE | Freq: Two times a day (BID) | ORAL | 0 refills | Status: DC

## 2023-02-25 MED ORDER — POLYETHYLENE GLYCOL 3350 17 G PO PACK: 17.0000 g | PACK | Freq: Every day | ORAL | 0 refills | Status: DC | PRN

## 2023-02-25 MED ORDER — CEFDINIR 300 MG PO CAPS: 300.0000 mg | ORAL_CAPSULE | Freq: Two times a day (BID) | ORAL | 0 refills | Status: DC

## 2023-02-25 MED ORDER — MAALOX MAX 400-400-40 MG/5ML PO SUSP: 10.0000 mL | Freq: Three times a day (TID) | ORAL | 0 refills | Status: AC

## 2023-02-25 MED ORDER — LACTINEX PO CHEW: 1.0000 | CHEWABLE_TABLET | Freq: Three times a day (TID) | ORAL | 0 refills | Status: DC

## 2023-02-25 MED ORDER — PANTOPRAZOLE SODIUM 40 MG IV SOLR
40.0000 mg | Freq: Two times a day (BID) | INTRAVENOUS | Status: DC
  Administered 2023-02-25: 40 mg via INTRAVENOUS
  Filled 2023-02-25: qty 10

## 2023-02-25 NOTE — Telephone Encounter (Signed)
Referral sent to [facility] 

## 2023-02-25 NOTE — TOC Transition Note (Signed)
Transition of Care [facility]) - CM/SW Discharge Note   Patient Details  Name: Heather [name] MRN: [id number] Date of Birth: Mar 03, 1951  [facility] of Care [facility]) CM/SW Contact:  Leitha Bleak, RN Phone Number: 02/25/2023, 1:19 PM   Clinical Narrative:   CM spoke with her husband. He will be home with her. They want to go home with [facility] health. MD ordered. Husband ask if staff could meet him at the front entrance. Rn updated and agreed, She will call him with and ETA.  Cory updated with discharge.     Final next level of care: Home w Home Health Services Barriers to Discharge: Barriers Resolved   Patient Goals and CMS Choice CMS Medicare.gov Compare Post Acute Care list provided to:: Patient Represenative (must comment) Choice offered to / list presented to : Spouse  Discharge Placement                    Name of family member notified: Husband Patient and family notified of of transfer: 02/25/23  Discharge Plan and Services Additional resources added to the After Visit Summary for        [facility] Agency: [facility] Date [facility] Agency Contacted: 02/25/23 Time HH Agency Contacted: [phone] Representative spoke with at [facility] Agency: Kandee Keen  Social Determinants of Health (SDOH) Interventions SDOH Screenings   Food Insecurity: No Food Insecurity (02/17/2023)  Housing: Low Risk  (02/17/2023)  Transportation Needs: No Transportation Needs (02/17/2023)  Utilities: Not At Risk (02/17/2023)  Tobacco Use: Low Risk  (02/22/2023)     Readmission Risk Interventions    02/06/2023    8:28 AM 11/14/2022   11:31 AM 11/12/2022    4:06 PM  Readmission Risk Prevention Plan  Transportation Screening Complete  Complete  Medication Review Oceanographer) Complete  Complete  PCP or Specialist appointment within 3-5 days of discharge  Complete Not Complete  HRI or Home Care Consult Complete  Complete  SW Recovery Care/Counseling Consult Complete  Complete  Palliative Care  Screening Not Applicable  Not Applicable  Skilled Nursing Facility Not Applicable  Complete

## 2023-02-25 NOTE — Consult Note (Signed)
   [facility] Inpatient Consult   02/25/2023  Heather [name] Oct 26, 1951 [id number]  Primary Care Provider:  Dr. Sherryll Burger [facility])  [facility] care-guides have attempted outreach call however remains unsuccessful. Pt has had a 7 and 30 readmission with 5 IP and 1 ED in 6 months.   Patient will receive a post hospital call and will be evaluated for assessments and disease process education.  [facility] liaison has updated the care guides on the above information and  to reach out to the pt's spouse contact numbers as initial requested with the referral on the pt's last admission.   Plan: Pt to be discharged today with ongoing HHPT with [facility]. No other services mentioned via TOC.  Of note, [facility] Care Management services does not replace or interfere with any services that are needed or arranged by inpatient [facility] care management team.   For additional questions or referrals please contact:  Elliot Cousin, RN, BSN [facility] Liaison [place]   [facility]  Population Health Office Hours MTWF 8:00 am to 6 pm off on Thursday [phone] mobile [phone] [Office toll free line][facility] Office Hours are M-F 8:30 - 5 pm 24 hour nurse advise line [phone] Conceirge  [name]

## 2023-02-25 NOTE — Telephone Encounter (Signed)
Dena: we need to have repeat CBC in 1 week. History of GI bleed.   Tammy/Mindy: we need to refer back to [facility]. She is established with them. Hospital follow-up.

## 2023-02-25 NOTE — Discharge Summary (Signed)
Physician Discharge Summary   Patient: Heather [name] MRN: [id number] DOB: Nov 01, 1950  Admit date:     02/17/2023  Discharge date: 02/25/23  Discharge Physician: Kendell Bane   PCP: [name], MD   Recommendations at discharge:  - With a gastroenterologist within 1 week -Follow with the PCP in 1-2 weeks - continue home health PT/OT, fall precautions,  -Avoid NSAIDs, aspirin, ibuprofen, and similar products  Discharge Diagnoses: Principal Problem:   GI bleed Active Problems:   Acute renal failure superimposed on stage 4 chronic kidney disease (HCC)   Acute metabolic encephalopathy   Acute on chronic anemia   Pancreatic lesion   Proctocolitis   Hypoalbuminemia   Diabetes mellitus (HCC)   Essential hypertension   UTI (urinary tract infection)   Gout   Melena   Visual disturbance   Cerebrovascular accident (CVA) due to embolism of cerebral artery (HCC)   Rectal bleeding   ABLA (acute blood loss anemia)   PUD (peptic ulcer disease)  Resolved Problems:   * No resolved hospital problems. *  Hospital Course: Brief Narrative:    KAIYA [name] is a 72 y.o. female with medical history significant for diabetes mellitus, Crohn's disease, CKD 4, diastolic CHF, pulmonary hypertension, gout. Patient presented to the ED with reports of dark red stools. Per notes, patient was confused in triage.  After transfusion in ED, mental status improved.  Patient was admitted with symptomatic acute blood loss anemia in the setting of active GI bleed.  She has had 3 unit PRBC transfusion with improvement in hemoglobin levels and underwent GI evaluation with EGD and flex sigmoidoscopy on 4/23 with findings of esophageal ulcers and duodenal ulcers with oozing hemorrhage requiring clips.  Biopsies also taken for possible Crohn's.  GI recommending clear liquid diet and Protonix infusion for 72 hours.       Acute symptomatic blood loss anemia secondary to GI bleed With acute on  chronic anemia.  Hgb down to 3.2, recent hemoglobin/14 on discharge from the hospital 8.2, she received 1 unit PRBC for hemoglobin of 6.7.  Reports bloody stools over the past 3 days, no hematemesis.  Positive NSAID use history. Not on anticoagulation.  10/2020, capsule endoscopy-several small aphthous appearing ulcers scattered in small bowel,-mild IBD/Crohn's. Colonoscopy and EGD 2021 were unremarkable. -Status post 3 unit PRBC transfusion now  -Underwent EGD and flex sigmoidoscopy 4/23 with recommendations to remain on PPI drip for 72 hours as well as clear liquid diet -Tolerating diet now continue PPI oral    Latest Ref Rng & Units 02/25/2023    9:46 AM 02/24/2023    4:52 AM 02/23/2023    4:34 AM  CBC  WBC 4.0 - 10.5 K/uL 9.2  7.6  5.4   Hemoglobin 12.0 - 15.0 g/dL 9.6  40.9  81.1   Hematocrit 36.0 - 46.0 % 29.6  33.6  33.5   Platelets 150 - 400 K/uL 196  191  155       Proctocolitis Presenting with bloody stools, history of Crohn's.  Leukocytosis of 17.2.  ESR elevated at >130.  Serum ferritin 1462.  Rules out for sepsis.  Denies pain with bowel movements.  CT abdomen and pelvis without contrast-  Mild bowel wall thickening and mucosal enhancement involving portions of the sigmoid colon and rectum may reflect proctocolitis. -IV ceftriaxone and metronidazole for now >>> switch to p.o. Omnicef   Pancreatic lesion Incidental finding on CT today 02/17/2023-  Low-density pancreatic lesion. When the patient is clinically stable  and able to follow directions and hold their breath (preferably as an outpatient) further evaluation with dedicated abdominal MRI could be considered for further characterization. -Follow-up as outpatient   Acute metabolic encephalopathy Patient initially confused in triage, mental status is improved after 1 unit PRBC.  Encephalopathy likely due to severe anemia, ??  Proctocolitis.  She has a leukocytosis of 17.2.  Also reports dysuria. - Monitor for now, defer to brain  imaging for now -Resume home Keppra, venlafaxine    Acute renal failure superimposed on stage 4 chronic kidney disease AKI on CKD stage IV.  Cr- 3.26, last discharge creatinine 4/14- 1.8. In the setting of severe anemia GI bleed.  -Status post p.o. bicarb, IV fluid resuscitation Lab Results  Component Value Date   CREATININE 2.43 (H) 02/25/2023   CREATININE 2.33 (H) 02/24/2023   CREATININE 2.36 (H) 02/23/2023     Hypoalbuminemia Albumin 1.6.  Calcium 7.5, corrected calcium - 9.4.   Diabetes mellitus -Diabetic diet, continue home regimen   Essential hypertension Initial unreliable blood pressures. -Hold home antihypertensives on hold, now can resume metoprolol   Urinary tract infection  -Symptomatic with dysuria -UA revealed large leukocyte esterase, >50 WBCs, will follow-up with cultures -Current antibiotics of Rocephin>>>  switch to p.o. Omnicef   Gout Resume allopurinol      Consultants:  GI   Procedures:  EGD and flex sigmoidoscopy 4/23      Consultants: Gastroenterologist, post EGD x 2  Disposition: Home health Diet recommendation:  Discharge Diet Orders (From admission, onward)     Start     Ordered   02/25/23 0000  Diet - low sodium heart healthy        02/25/23 1217           Carb modified diet DISCHARGE MEDICATION: Allergies as of 02/25/2023       Reactions   Cozaar [losartan] Nausea And Vomiting, Swelling, Rash   Quinolones Itching, Nausea And Vomiting, Nausea Only, Swelling   Swelling of face, jaw, and lips   Cymbalta [duloxetine Hcl] Itching, Swelling   Sulfa Antibiotics Nausea And Vomiting, Swelling   Headache  Swelling of feet, legs   Synalar [fluocinolone] Swelling   Cipro [ciprofloxacin Hcl] Nausea And Vomiting, Swelling   Swelling of face, jaw, lips   Diovan [valsartan] Nausea And Vomiting, Swelling   Glucophage [metformin] Other (See Comments)   GI upset   Ketalar [ketamine] Swelling   Norco [hydrocodone-acetaminophen]  Itching, Swelling   Norvasc [amlodipine Besylate] Itching, Swelling   Fluid retention    Nsaids Nausea And Vomiting, Other (See Comments)   Has bleeding ulcers   Tape Itching, Rash   Paper tape can only be used on this patient   Zestril [lisinopril] Swelling, Rash   Oral swelling Red streaks on arms/legs        Medication List     STOP taking these medications    gabapentin 300 MG capsule Commonly known as: NEURONTIN   naloxone 4 MG/0.1ML Liqd nasal spray kit Commonly known as: NARCAN   naproxen sodium 220 MG tablet Commonly known as: ALEVE       TAKE these medications    (feeding supplement) PROSource Plus liquid Take 30 mLs by mouth 3 (three) times daily between meals.   allopurinol 100 MG tablet Commonly known as: ZYLOPRIM Take 100 mg by mouth daily.   atorvastatin 80 MG tablet Commonly known as: LIPITOR Take 1 tablet (80 mg total) by mouth daily. Restart on 06/29/22 What changed: when to take  this   cefdinir 300 MG capsule Commonly known as: OMNICEF Take 1 capsule (300 mg total) by mouth 2 (two) times daily for 5 days.   esomeprazole 40 MG capsule Commonly known as: NexIUM Take 1 capsule (40 mg total) by mouth 2 (two) times daily before a meal.   HYDROcodone-acetaminophen 5-325 MG tablet Commonly known as: NORCO/VICODIN Take 1 tablet by mouth every 6 (six) hours as needed.   insulin aspart 100 UNIT/ML FlexPen Commonly known as: NOVOLOG Inject 2 Units into the skin 3 (three) times daily with meals. Give only if eats 50% or more of meal. What changed: how much to take   insulin glargine-yfgn 100 UNIT/ML Pen Commonly known as: SEMGLEE Inject 7 Units into the skin daily. What changed: how much to take   lactobacillus acidophilus & bulgar chewable tablet Chew 1 tablet by mouth 3 (three) times daily with meals for 10 days.   levETIRAcetam 500 MG tablet Commonly known as: KEPPRA Take 500 mg by mouth 2 (two) times daily.   lipase/protease/amylase  36000 UNITS Cpep capsule Commonly known as: CREON Take 36,000 Units by mouth 3 (three) times daily with meals.   Maalox Max 400-400-40 MG/5ML suspension Generic drug: alum & mag hydroxide-simeth Take 10 mLs by mouth every 8 (eight) hours for 12 days.   metoprolol succinate 25 MG 24 hr tablet Commonly known as: TOPROL-XL Take 1.5 tablets (37.5 mg total) by mouth daily.   ondansetron 4 MG disintegrating tablet Commonly known as: ZOFRAN-ODT Take 4 mg by mouth every 6 (six) hours as needed.   polyethylene glycol 17 g packet Commonly known as: MIRALAX / GLYCOLAX Take 17 g by mouth daily as needed for mild constipation.   sodium bicarbonate 650 MG tablet Take 1 tablet (650 mg total) by mouth daily.   venlafaxine XR 150 MG 24 hr capsule Commonly known as: EFFEXOR-XR Take 150 mg by mouth daily.               Discharge Care Instructions  (From admission, onward)           Start     Ordered   02/25/23 0000  Discharge wound care:       Comments: Continue wound care per nursing instructions   02/25/23 1217            Discharge Exam: Filed Weights   02/19/23 0951 02/20/23 0504 02/22/23 1400  Weight: 55 kg 55.9 kg 55.9 kg        General:  AAO x 3,  cooperative, no distress;   HEENT:  Normocephalic, PERRL, otherwise with in Normal limits   Neuro:  CNII-XII intact. , normal motor and sensation, reflexes intact   Lungs:   Clear to auscultation BL, Respirations unlabored,  No wheezes / crackles  Cardio:    S1/S2, RRR, No murmure, No Rubs or Gallops   Abdomen:  Soft, non-tender, bowel sounds active all four quadrants, no guarding or peritoneal signs.  Muscular  skeletal:  Limited exam -global generalized weaknesses - in bed, able to move all 4 extremities,   2+ pulses,  symmetric, No pitting edema  Skin:  Dry, warm to touch, negative for any Rashes,  Wounds: Please see nursing documentation  Pressure Injury 02/19/23 Buttocks Right Stage 2 -  Partial thickness  loss of dermis presenting as a shallow open injury with a red, pink wound bed without slough. 1 x 0.8 (Active)  02/19/23 0500  Location: Buttocks  Location Orientation: Right  Staging: Stage 2 -  Partial thickness loss of dermis presenting as a shallow open injury with a red, pink wound bed without slough.  Wound Description (Comments): 1 x 0.8  Present on Admission: Yes (as per admitting nurse)  Dressing Type Foam - Lift dressing to assess site every shift 02/24/23 2015     Pressure Injury 02/19/23 Buttocks Left Stage 2 -  Partial thickness loss of dermis presenting as a shallow open injury with a red, pink wound bed without slough. 1.5 x 1 (Active)  02/19/23 0500  Location: Buttocks  Location Orientation: Left  Staging: Stage 2 -  Partial thickness loss of dermis presenting as a shallow open injury with a red, pink wound bed without slough.  Wound Description (Comments): 1.5 x 1  Present on Admission: Yes (as per admitting nurse)  Dressing Type Foam - Lift dressing to assess site every shift 02/24/23 2015          Condition at discharge: fair  The results of significant diagnostics from this hospitalization (including imaging, microbiology, ancillary and laboratory) are listed below for reference.   Imaging Studies: CT ABDOMEN PELVIS WO CONTRAST  Result Date: 02/17/2023 CLINICAL DATA:  Concern for bowel obstruction. Patient reportedly has red stool. EXAM: CT ABDOMEN AND PELVIS WITHOUT CONTRAST TECHNIQUE: Multidetector CT imaging of the abdomen and pelvis was performed following the standard protocol without IV contrast. RADIATION DOSE REDUCTION: This exam was performed according to the departmental dose-optimization program which includes automated exposure control, adjustment of the mA and/or kV according to patient size and/or use of iterative reconstruction technique. COMPARISON:  CT abdomen pelvis dated 02/04/2023. FINDINGS: Lower chest: Moderate bilateral pleural effusions with  associated atelectasis are partially imaged. The heart is mildly enlarged. The blood pool is hypoattenuating, suggestive of anemia. Hepatobiliary: No focal liver abnormality is seen. Status post cholecystectomy. No biliary dilatation. Pancreas: Atrophic. A 1.8 cm low-density lesion in the pancreatic tail appears unchanged. No pancreatic ductal dilatation or surrounding inflammatory changes. Spleen: Normal in size without focal abnormality. Adrenals/Urinary Tract: Adrenal glands are unremarkable. Bilateral punctate calcifications in both kidneys may represent nonobstructing calculi measuring up to 2 mm and/or vascular calcifications. No suspicious focal lesion or hydronephrosis on either side. Bladder is unremarkable. Stomach/Bowel: Stomach is within normal limits. The appendix is not definitely identified, however no pericecal inflammatory changes are noted to suggest acute appendicitis. There is a mild bowel wall thickening and mucosal enhancement involving portions of the sigmoid colon and rectum. No significant surrounding inflammatory changes. No bowel obstruction. Vascular/Lymphatic: Aortic atherosclerosis. No enlarged abdominal or pelvic lymph nodes. Reproductive: Status post hysterectomy. No adnexal masses. Other: No abdominal wall hernia or abnormality. No abdominopelvic ascites. Anasarca is noted Musculoskeletal: There has been slight progression since 02/04/2023 of a T12 compression deformity with approximately 50% height loss centrally and 3 mm retropulsion into the central canal. Fixation hardware is seen in the right femur. Fixation hardware in the lumbar spine from L1-L4. IMPRESSION: 1. Mild bowel wall thickening and mucosal enhancement involving portions of the sigmoid colon and rectum may reflect proctocolitis. No bowel obstruction. 2. Moderate bilateral pleural effusions with associated atelectasis. 3. Slight progression since 02/04/2023 of a T12 compression deformity with approximately 50% height  loss centrally and 3 mm retropulsion into the central canal. 4. Low-density pancreatic lesion. When the patient is clinically stable and able to follow directions and hold their breath (preferably as an outpatient) further evaluation with dedicated abdominal MRI could be considered for further characterization. Aortic Atherosclerosis (ICD10-I70.0). Electronically Signed   By: Joselyn Glassman  Litton M.D.   On: 02/17/2023 17:36   DG Chest Port 1 View  Result Date: 02/05/2023 CLINICAL DATA:  Placement of central venous catheter EXAM: PORTABLE CHEST 1 VIEW COMPARISON:  Previous studies including the examination done on 12/15/2022 FINDINGS: Cardiac size is within normal limits. Apparent shift of mediastinum to the right may be due to rotation. There is interval placement of central venous catheter through the right internal jugular with its tip in right atrium. There is no pleural effusion or pneumothorax. Small linear densities are seen in right lower lung field. Surgical fusion is seen in cervical spine, upper thoracic spine and lumbar spine. Surgical clips are seen in gallbladder fossa. There is deformity in right clavicle consistent with old healed fracture. IMPRESSION: Tip of right IJ central venous catheter is seen in right atrium. There is no pneumothorax. Linear densities in left lower lung field may suggest subsegmental atelectasis or scarring. Electronically Signed   By: Ernie Avena M.D.   On: 02/05/2023 12:55   [place] EKG SITE RITE  Result Date: 02/05/2023 If Site Rite image not attached, placement could not be confirmed due to current cardiac rhythm.  CT Renal Stone Study  Result Date: 02/04/2023 CLINICAL DATA:  Abdominal and flank pain with emesis. EXAM: CT ABDOMEN AND PELVIS WITHOUT CONTRAST TECHNIQUE: Multidetector CT imaging of the abdomen and pelvis was performed following the standard protocol without IV contrast. RADIATION DOSE REDUCTION: This exam was performed according to the departmental  dose-optimization program which includes automated exposure control, adjustment of the mA and/or kV according to patient size and/or use of iterative reconstruction technique. COMPARISON:  CT abdomen and pelvis 08/03/2022 FINDINGS: Lower chest: No acute abnormality. Hepatobiliary: No focal liver abnormality is seen. Status post cholecystectomy. No biliary dilatation. Pancreas: Rounded low-density area in the tail the pancreas measures 18 mm in diameter and was not seen on prior. There is no acute pancreatic inflammation or ductal dilatation. Spleen: Normal in size without focal abnormality. Adrenals/Urinary Tract: Subcentimeter cortical cyst in the left kidney is unchanged. Otherwise, the adrenal glands, kidneys and bladder are within normal limits. Stomach/Bowel: There is moderate fluid distention of the stomach. There some fluid-filled jejunal loops without definite dilatation or transition point. The colon is nondilated. There is a large amount of stool throughout the colon. The appendix is not seen. No free air or pneumatosis. Vascular/Lymphatic: There are severe atherosclerotic disease throughout the aorta and peripheral vessels. No enlarged lymph nodes. No aortic aneurysm. Reproductive: Status post hysterectomy. No adnexal masses. Other: No abdominal wall hernia or abnormality. No abdominopelvic ascites. Musculoskeletal: L1-L4 posterior fusion hardware is present. There is stable compression fracture of T12. Right hip screw is present. Healed left inferior pubic ramus fracture. IMPRESSION: 1. Moderate fluid distention of the stomach. There are some fluid-filled jejunal loops without definite dilatation or transition point. Findings may be related to gastroenteritis. 2. Large amount of stool throughout the colon. 3. 18 mm low-density lesion in the tail of the pancreas, not seen on prior. This can be further characterized with MRI. 4. Severe atherosclerotic disease of the aorta and peripheral vessels. 5.  Subcentimeter left Bosniak I benign renal cyst. No follow-up imaging is recommended. JACR 2018 Feb; 264-273, Management of the Incidental Renal Mass on CT, [employer] 2021; [phone], Bosniak Classification of Cystic Renal Masses, Version 2019. Aortic Atherosclerosis (ICD10-I70.0). Electronically Signed   By: Darliss Cheney M.D.   On: 02/04/2023 20:42    Microbiology: Results for orders placed or performed during the hospital encounter of 02/17/23  MRSA Next Gen by PCR, Nasal     Status: Abnormal   Collection Time: 02/17/23  7:35 PM   Specimen: Nasal Mucosa; Nasal Swab  Result Value Ref Range Status   MRSA by PCR Next Gen DETECTED (A) NOT DETECTED Final    Comment: RESULT CALLED TO, READ BACK BY AND VERIFIED WITH: KING J @ 0747 ON [id number] BY HENDERSON L (NOTE) The GeneXpert MRSA Assay (FDA approved for NASAL specimens only), is one component of a comprehensive MRSA colonization surveillance program. It is not intended to diagnose MRSA infection nor to guide or monitor treatment for MRSA infections. Test performance is not FDA approved in patients less than 22 years old. Performed at [facility], [address], [place], [place] [address]   Urine Culture (for pregnant, neutropenic or urologic patients or patients with an indwelling urinary catheter)     Status: Abnormal   Collection Time: 02/20/23  3:20 PM   Specimen: Urine, Clean Catch  Result Value Ref Range Status   Specimen Description   Final    URINE, CLEAN CATCH Performed at [facility], [address], [place], [place] [address]    Special Requests   Final    NONE Performed at [facility], [address], [place], [place] [address]    Culture (A)  Final    <10,000 COLONIES/mL INSIGNIFICANT GROWTH Performed at [facility], [address], [place], [place] [address]    Report Status 02/21/2023 FINAL  Final    Labs: CBC: Recent Labs  Lab 02/21/23 0557 02/21/23 1222 02/22/23 0423 02/22/23 1114 02/23/23 0434  02/24/23 0452 02/25/23 0946  WBC 4.9  --  5.1  --  5.4 7.6 9.2  HGB 7.7*   < > 7.1* 7.7* 11.2* 11.2* 9.6*  HCT 24.0*   < > 22.9* 24.1* 33.5* 33.6* 29.6*  MCV 93.8  --  97.0  --  91.8 91.6 96.7  PLT 177  --  162  --  155 191 196   < > = values in this interval not displayed.   Basic Metabolic Panel: Recent Labs  Lab 02/19/23 0255 02/20/23 1125 02/21/23 0557 02/22/23 0423 02/23/23 0434 02/24/23 0452 02/25/23 0602  NA 134*   < > 134* 135 136 137 139  K 4.0   < > 3.9 4.0 3.6 3.9 3.6  CL 102   < > 105 108 108 110 111  CO2 22   < > 20* 18* 19* 19* 17*  GLUCOSE 75   < > 175* 118* 113* 79 208*  BUN 112*   < > 100* 92* 83* 82* 78*  CREATININE 3.27*   < > 2.75* 2.50* 2.36* 2.33* 2.43*  CALCIUM 7.0*   < > 7.7* 7.6* 8.0* 8.1* 8.0*  MG 1.3*  --   --   --   --   --   --    < > = values in this interval not displayed.   Liver Function Tests: No results for input(s): "AST", "ALT", "ALKPHOS", "BILITOT", "PROT", "ALBUMIN" in the last 168 hours. CBG: Recent Labs  Lab 02/24/23 2032 02/25/23 0009 02/25/23 0446 02/25/23 0753 02/25/23 1132  GLUCAP 201* 212* 203* 186* 239*    Discharge time spent: greater than 50 minutes.  Signed: Kendell Bane, MD [facility] 02/25/2023

## 2023-02-25 NOTE — Progress Notes (Signed)
Gastroenterology Progress Note    Primary Care Physician:  [name], MD Primary Gastroenterologist:  Dr. [name] Payor ([facility])  Patient ID: Heather [name]; [id number]; Jan 03, 1951    Subjective   Difficult historian. States left hip hurts. Small amount of dark stool today. Nurse tech at bedside cleaning patient.    Objective   Vital signs in last 24 hours Temp:  [97.7 F (36.5 C)-97.8 F (36.6 C)] 97.8 F (36.6 C) (04/29 1207) Pulse Rate:  [77-115] 115 (04/29 1207) Resp:  [16-18] 18 (04/29 1207) BP: (120-150)/(59-73) 144/73 (04/29 1207) SpO2:  [99 %-100 %] 100 % (04/29 1207) Last BM Date : 02/25/23  Physical Exam General:   Alert and oriented, no distress Head:  Normocephalic and atraumatic. Abdomen:  Bowel sounds present, soft, non-tender, non-distended. No HSM or hernias noted. No rebound or guarding. No masses appreciated  Extremities:  Without  edema.  Intake/Output from previous day: 04/28 0701 - 04/29 0700 In: 840 [P.O.:840] Out: -  Intake/Output this shift: Total I/O In: 240 [P.O.:240] Out: -   Lab Results  Recent Labs    02/23/23 0434 02/24/23 0452 02/25/23 0946  WBC 5.4 7.6 9.2  HGB 11.2* 11.2* 9.6*  HCT 33.5* 33.6* 29.6*  PLT 155 191 196   BMET Recent Labs    02/23/23 0434 02/24/23 0452 02/25/23 0602  NA 136 137 139  K 3.6 3.9 3.6  CL 108 110 111  CO2 19* 19* 17*  GLUCOSE 113* 79 208*  BUN 83* 82* 78*  CREATININE 2.36* 2.33* 2.43*  CALCIUM 8.0* 8.1* 8.0*     Studies/Results CT ABDOMEN PELVIS WO CONTRAST  Result Date: 02/17/2023 CLINICAL DATA:  Concern for bowel obstruction. Patient reportedly has red stool. EXAM: CT ABDOMEN AND PELVIS WITHOUT CONTRAST TECHNIQUE: Multidetector CT imaging of the abdomen and pelvis was performed following the standard protocol without IV contrast. RADIATION DOSE REDUCTION: This exam was performed according to the departmental dose-optimization program which includes automated exposure control,  adjustment of the mA and/or kV according to patient size and/or use of iterative reconstruction technique. COMPARISON:  CT abdomen pelvis dated 02/04/2023. FINDINGS: Lower chest: Moderate bilateral pleural effusions with associated atelectasis are partially imaged. The heart is mildly enlarged. The blood pool is hypoattenuating, suggestive of anemia. Hepatobiliary: No focal liver abnormality is seen. Status post cholecystectomy. No biliary dilatation. Pancreas: Atrophic. A 1.8 cm low-density lesion in the pancreatic tail appears unchanged. No pancreatic ductal dilatation or surrounding inflammatory changes. Spleen: Normal in size without focal abnormality. Adrenals/Urinary Tract: Adrenal glands are unremarkable. Bilateral punctate calcifications in both kidneys may represent nonobstructing calculi measuring up to 2 mm and/or vascular calcifications. No suspicious focal lesion or hydronephrosis on either side. Bladder is unremarkable. Stomach/Bowel: Stomach is within normal limits. The appendix is not definitely identified, however no pericecal inflammatory changes are noted to suggest acute appendicitis. There is a mild bowel wall thickening and mucosal enhancement involving portions of the sigmoid colon and rectum. No significant surrounding inflammatory changes. No bowel obstruction. Vascular/Lymphatic: Aortic atherosclerosis. No enlarged abdominal or pelvic lymph nodes. Reproductive: Status post hysterectomy. No adnexal masses. Other: No abdominal wall hernia or abnormality. No abdominopelvic ascites. Anasarca is noted Musculoskeletal: There has been slight progression since 02/04/2023 of a T12 compression deformity with approximately 50% height loss centrally and 3 mm retropulsion into the central canal. Fixation hardware is seen in the right femur. Fixation hardware in the lumbar spine from L1-L4. IMPRESSION: 1. Mild bowel wall thickening and mucosal enhancement involving portions of the  sigmoid colon and  rectum may reflect proctocolitis. No bowel obstruction. 2. Moderate bilateral pleural effusions with associated atelectasis. 3. Slight progression since 02/04/2023 of a T12 compression deformity with approximately 50% height loss centrally and 3 mm retropulsion into the central canal. 4. Low-density pancreatic lesion. When the patient is clinically stable and able to follow directions and hold their breath (preferably as an outpatient) further evaluation with dedicated abdominal MRI could be considered for further characterization. Aortic Atherosclerosis (ICD10-I70.0). Electronically Signed   By: Romona Curls M.D.   On: 02/17/2023 17:36   DG Chest Port 1 View  Result Date: 02/05/2023 CLINICAL DATA:  Placement of central venous catheter EXAM: PORTABLE CHEST 1 VIEW COMPARISON:  Previous studies including the examination done on 12/15/2022 FINDINGS: Cardiac size is within normal limits. Apparent shift of mediastinum to the right may be due to rotation. There is interval placement of central venous catheter through the right internal jugular with its tip in right atrium. There is no pleural effusion or pneumothorax. Small linear densities are seen in right lower lung field. Surgical fusion is seen in cervical spine, upper thoracic spine and lumbar spine. Surgical clips are seen in gallbladder fossa. There is deformity in right clavicle consistent with old healed fracture. IMPRESSION: Tip of right IJ central venous catheter is seen in right atrium. There is no pneumothorax. Linear densities in left lower lung field may suggest subsegmental atelectasis or scarring. Electronically Signed   By: Ernie Avena M.D.   On: 02/05/2023 12:55   [place] EKG SITE RITE  Result Date: 02/05/2023 If Site Rite image not attached, placement could not be confirmed due to current cardiac rhythm.  CT Renal Stone Study  Result Date: 02/04/2023 CLINICAL DATA:  Abdominal and flank pain with emesis. EXAM: CT ABDOMEN AND PELVIS WITHOUT  CONTRAST TECHNIQUE: Multidetector CT imaging of the abdomen and pelvis was performed following the standard protocol without IV contrast. RADIATION DOSE REDUCTION: This exam was performed according to the departmental dose-optimization program which includes automated exposure control, adjustment of the mA and/or kV according to patient size and/or use of iterative reconstruction technique. COMPARISON:  CT abdomen and pelvis 08/03/2022 FINDINGS: Lower chest: No acute abnormality. Hepatobiliary: No focal liver abnormality is seen. Status post cholecystectomy. No biliary dilatation. Pancreas: Rounded low-density area in the tail the pancreas measures 18 mm in diameter and was not seen on prior. There is no acute pancreatic inflammation or ductal dilatation. Spleen: Normal in size without focal abnormality. Adrenals/Urinary Tract: Subcentimeter cortical cyst in the left kidney is unchanged. Otherwise, the adrenal glands, kidneys and bladder are within normal limits. Stomach/Bowel: There is moderate fluid distention of the stomach. There some fluid-filled jejunal loops without definite dilatation or transition point. The colon is nondilated. There is a large amount of stool throughout the colon. The appendix is not seen. No free air or pneumatosis. Vascular/Lymphatic: There are severe atherosclerotic disease throughout the aorta and peripheral vessels. No enlarged lymph nodes. No aortic aneurysm. Reproductive: Status post hysterectomy. No adnexal masses. Other: No abdominal wall hernia or abnormality. No abdominopelvic ascites. Musculoskeletal: L1-L4 posterior fusion hardware is present. There is stable compression fracture of T12. Right hip screw is present. Healed left inferior pubic ramus fracture. IMPRESSION: 1. Moderate fluid distention of the stomach. There are some fluid-filled jejunal loops without definite dilatation or transition point. Findings may be related to gastroenteritis. 2. Large amount of stool  throughout the colon. 3. 18 mm low-density lesion in the tail of the pancreas, not seen  on prior. This can be further characterized with MRI. 4. Severe atherosclerotic disease of the aorta and peripheral vessels. 5. Subcentimeter left Bosniak I benign renal cyst. No follow-up imaging is recommended. JACR 2018 Feb; 264-273, Management of the Incidental Renal Mass on CT, [employer] 2021; 814-848, Bosniak Classification of Cystic Renal Masses, Version 2019. Aortic Atherosclerosis (ICD10-I70.0). Electronically Signed   By: Darliss Cheney M.D.   On: 02/04/2023 20:42    Assessment/Plan.   72 y.o. female with a history of small bowel Crohn's disease diagnosed in Jan 2022 and previously on Humira, history of pancreatitis possibly related to hypertriglyceridemia and use of Victoza, CKD, depression, diabetes, GERD, anxiety, IBS, hyperlipidemia, stroke, presenting with profound anemia of Hgb 3.2. Admitted with acute symptomatic blood loss anemia in setting of GI bleed, proctocolitis, acute renal failure on CKD, acute metabolic encephalopathy but improved following blood transfusion.    GI bleed: in setting of NSAID use. EGD 4/23 with esophageal ulcers and esophagitis, oozing duodenal ulcers and clots s/p injection and clips. Due to persistent bleeding, underwent repeat EGD 4/26 with visible vessel from ulcer base s/p epi and clip placement.   Dark stool this morning likely r/t old blood. Hemodynamically stable. Hgb 9.6, down from 11.2 yesterday.   Patient has orders for discharge home. We will recheck CBC in 1 week. She will need to follow-up with [facility].   Gelene Mink, PhD, ANP-BC [facility]     LOS: 8 days    02/25/2023, 2:18 PM  Gelene Mink, PhD, ANP-BC [facility]

## 2023-02-25 NOTE — Progress Notes (Signed)
Pt had loose, tarry and watery stool throughout the night. Skin around rectum is dark pink. Barrier cream applied. She refused meds d/t nausea She c/o of nausea and RN administered  anti-nausea medication. Pt still refused several po meds after getting medication for nausea.

## 2023-02-25 NOTE — Progress Notes (Signed)
Mobility Specialist Progress Note:    02/25/23 1340  Mobility  Activity Transferred from chair to bed  Level of Assistance Maximum assist, patient does 25-49%  Assistive Device Front wheel walker  Distance Ambulated (ft) 3 ft  Activity Response Tolerated well  Mobility Referral Yes  $Mobility charge 1 Mobility   Pt requested assistance to transfer chair to bed. Required MaxA to stand and pivot with RW. Left pt in bed alarm on, all needs met, alarm on.   Feliciana Rossetti Mobility Specialist Please contact via Special educational needs teacher or  Rehab office at [phone]

## 2023-02-25 NOTE — Progress Notes (Signed)
Nsg Discharge Note  Admit Date:  02/17/2023 Discharge date: 02/25/2023   DAUNE DIVIRGILIO to be D/C'd Home per MD order.  AVS completed.  Copy for chart, and copy for patient signed, and dated. Patient/caregiver able to verbalize understanding.  Discharge Medication: Allergies as of 02/25/2023       Reactions   Cozaar [losartan] Nausea And Vomiting, Swelling, Rash   Quinolones Itching, Nausea And Vomiting, Nausea Only, Swelling   Swelling of face, jaw, and lips   Cymbalta [duloxetine Hcl] Itching, Swelling   Sulfa Antibiotics Nausea And Vomiting, Swelling   Headache  Swelling of feet, legs   Synalar [fluocinolone] Swelling   Cipro [ciprofloxacin Hcl] Nausea And Vomiting, Swelling   Swelling of face, jaw, lips   Diovan [valsartan] Nausea And Vomiting, Swelling   Glucophage [metformin] Other (See Comments)   GI upset   Ketalar [ketamine] Swelling   Norco [hydrocodone-acetaminophen] Itching, Swelling   Norvasc [amlodipine Besylate] Itching, Swelling   Fluid retention    Nsaids Nausea And Vomiting, Other (See Comments)   Has bleeding ulcers   Tape Itching, Rash   Paper tape can only be used on this patient   Zestril [lisinopril] Swelling, Rash   Oral swelling Red streaks on arms/legs        Medication List     STOP taking these medications    gabapentin 300 MG capsule Commonly known as: NEURONTIN   naloxone 4 MG/0.1ML Liqd nasal spray kit Commonly known as: NARCAN   naproxen sodium 220 MG tablet Commonly known as: ALEVE       TAKE these medications    (feeding supplement) PROSource Plus liquid Take 30 mLs by mouth 3 (three) times daily between meals.   allopurinol 100 MG tablet Commonly known as: ZYLOPRIM Take 100 mg by mouth daily.   atorvastatin 80 MG tablet Commonly known as: LIPITOR Take 1 tablet (80 mg total) by mouth daily. Restart on 06/29/22 What changed: when to take this   cefdinir 300 MG capsule Commonly known as: OMNICEF Take 1 capsule  (300 mg total) by mouth 2 (two) times daily for 5 days.   esomeprazole 40 MG capsule Commonly known as: NexIUM Take 1 capsule (40 mg total) by mouth 2 (two) times daily before a meal.   HYDROcodone-acetaminophen 5-325 MG tablet Commonly known as: NORCO/VICODIN Take 1 tablet by mouth every 6 (six) hours as needed.   insulin aspart 100 UNIT/ML FlexPen Commonly known as: NOVOLOG Inject 2 Units into the skin 3 (three) times daily with meals. Give only if eats 50% or more of meal. What changed: how much to take   insulin glargine-yfgn 100 UNIT/ML Pen Commonly known as: SEMGLEE Inject 7 Units into the skin daily. What changed: how much to take   lactobacillus acidophilus & bulgar chewable tablet Chew 1 tablet by mouth 3 (three) times daily with meals for 10 days.   levETIRAcetam 500 MG tablet Commonly known as: KEPPRA Take 500 mg by mouth 2 (two) times daily.   lipase/protease/amylase 16109 UNITS Cpep capsule Commonly known as: CREON Take 36,000 Units by mouth 3 (three) times daily with meals.   Maalox Max 400-400-40 MG/5ML suspension Generic drug: alum & mag hydroxide-simeth Take 10 mLs by mouth every 8 (eight) hours for 12 days.   metoprolol succinate 25 MG 24 hr tablet Commonly known as: TOPROL-XL Take 1.5 tablets (37.5 mg total) by mouth daily.   ondansetron 4 MG disintegrating tablet Commonly known as: ZOFRAN-ODT Take 4 mg by mouth every 6 (six)  hours as needed.   polyethylene glycol 17 g packet Commonly known as: MIRALAX / GLYCOLAX Take 17 g by mouth daily as needed for mild constipation.   sodium bicarbonate 650 MG tablet Take 1 tablet (650 mg total) by mouth daily.   venlafaxine XR 150 MG 24 hr capsule Commonly known as: EFFEXOR-XR Take 150 mg by mouth daily.               Discharge Care Instructions  (From admission, onward)           Start     Ordered   02/25/23 0000  Discharge wound care:       Comments: Continue wound care per nursing  instructions   02/25/23 1217            Discharge Assessment: Vitals:   02/25/23 0449 02/25/23 1207  BP: (!) 150/65 (!) 144/73  Pulse: (!) 109 (!) 115  Resp: 16 18  Temp: 97.7 F (36.5 C) 97.8 F (36.6 C)  SpO2: 99% 100%   Skin clean, dry and intact without evidence of skin break down, no evidence of skin tears noted. IV catheter discontinued intact. Site without signs and symptoms of complications - no redness or edema noted at insertion site, patient denies c/o pain - only slight tenderness at site.  Dressing with slight pressure applied.  D/c Instructions-Education: Discharge instructions given to patient/family with verbalized understanding. D/c education completed with patient/family including follow up instructions, medication list, d/c activities limitations if indicated, with other d/c instructions as indicated by MD - patient able to verbalize understanding, all questions fully answered. Patient instructed to return to ED, call 911, or call MD for any changes in condition.  Patient escorted via WC, and D/C home via private auto. Pt was a max two assist and it took 3 nurses to put pt in the car. Husband said that  was able to get her into the house.  Demetrio Lapping, LPN 1/61/0960 4:54 PM

## 2023-02-25 NOTE — Evaluation (Signed)
Physical Therapy Evaluation Patient Details Name: Heather [name] MRN: [id number] DOB: [birth year] Today's Date: 02/25/2023  History of Present Illness  Heather [name] is a 72 y.o. female with medical history significant for diabetes mellitus, Crohn's disease, CKD 4, diastolic CHF, pulmonary hypertension, gout.  Patient presented to the ED with reports of dark red stools. Per notes, patient was confused in triage.  After transfusion in ED, mental status improved.  At time of my evaluation, patient is A & O x 4, she is able to give a history.  She tells me she has been seeing blood in her stools.  She reports 4 bowel movements today, denies pain with bowel movements.  Denies abdominal pain.  No vomiting.  Reports poor oral intake.  She denies difficulty breathing, no orthopnea, no chest pain.  She takes ibuprofen about 3 tablets twice a day on average 2 days a week.  She has history of Crohn's, at baseline has 0-1 bowel movements daily.    Reports dysuria over the past 1 to 2 days.   Clinical Impression  Patient demonstrates slow labored movement for sitting up at bedside, limited use of right side due to baseline weakness, poor carryover for attempting to use RW for transfers, required hand held assist with knees blocked to stand pivot to chair.  Patient tolerated sitting up in chair after therapy - nursing staff aware.  PLAN:  Patient to be discharged home today and discharged from acute physical therapy to care of nursing for out of bed daily as tolerated for length of stay with recommendations stated below          Recommendations for follow up therapy are one component of a multi-disciplinary discharge planning process, led by the attending physician.  Recommendations may be updated based on patient status, additional functional criteria and insurance authorization.  Follow Up Recommendations Can patient physically be transported by private vehicle: Yes     Assistance Recommended at  Discharge Intermittent Supervision/Assistance  Patient can return home with the following  A lot of help with bathing/dressing/bathroom;A lot of help with walking and/or transfers;Help with stairs or ramp for entrance;Assistance with cooking/housework    Equipment Recommendations None recommended by PT  Recommendations for Other Services       Functional Status Assessment Patient has had a recent decline in their functional status and/or demonstrates limited ability to make significant improvements in function in a reasonable and predictable amount of time     Precautions / Restrictions Precautions Precautions: Fall Restrictions Weight Bearing Restrictions: No      Mobility  Bed Mobility Overal bed mobility: Needs Assistance Bed Mobility: Supine to Sit     Supine to sit: Mod assist     General bed mobility comments: increased time, labored movement    Transfers Overall transfer level: Needs assistance Equipment used: 1 person hand held assist Transfers: Sit to/from Stand, Bed to chair/wheelchair/BSC     Step pivot transfers: Mod assist, Max assist       General transfer comment: unsteady labored movement with fair/poor carryover for following directions    Ambulation/Gait Ambulation/Gait assistance: Max assist Gait Distance (Feet): 2 Feet Assistive device: 1 person hand held assist Gait Pattern/deviations: Decreased step length - right, Decreased step length - left, Decreased stance time - right, Decreased stride length, Shuffle Gait velocity: slow     General Gait Details: limited to a couple fo shuffling side steps with RLE sliding forward  Stairs  Wheelchair Mobility    Modified Rankin (Stroke Patients Only)       Balance Overall balance assessment: Needs assistance Sitting-balance support: Feet supported, No upper extremity supported Sitting balance-Leahy Scale: Fair Sitting balance - Comments: seated at EOB   Standing balance  support: During functional activity, Single extremity supported Standing balance-Leahy Scale: Poor Standing balance comment: hand held assist                             Pertinent Vitals/Pain Pain Assessment Pain Assessment: Faces Faces Pain Scale: Hurts little more Pain Location: buttocks when sitting Pain Descriptors / Indicators: Grimacing, Guarding, Sore Pain Intervention(s): Limited activity within patient's tolerance, Monitored during session, Repositioned    Home Living Family/patient expects to be discharged to:: Private residence Living Arrangements: Spouse/significant other Available Help at Discharge: Available PRN/intermittently Type of Home: House Home Access: Ramped entrance Entrance Stairs-Rails: None Entrance Stairs-Number of Steps: 1   Home Layout: One level Home Equipment: Shower seat;Wheelchair Financial trader (4 wheels);Wheelchair - power      Prior Function Prior Level of Function : Needs assist       Physical Assist : Mobility (physical);ADLs (physical) Mobility (physical): Gait;Bed mobility;Transfers;Stairs ADLs (physical): Bathing;Dressing;IADLs Mobility Comments: uses w/c for mobility, Husband assisting to transfer pt to w/c. ADLs Comments: Assisted by family.     Hand Dominance   Dominant Hand: Right    Extremity/Trunk Assessment   Upper Extremity Assessment Upper Extremity Assessment: Defer to OT evaluation    Lower Extremity Assessment Lower Extremity Assessment: Generalized weakness;RLE deficits/detail RLE Deficits / Details: grossly -3/5 except ankle dorsiflexion 1/5 RLE Sensation: decreased light touch;decreased proprioception RLE Coordination: decreased fine motor;decreased gross motor    Cervical / Trunk Assessment Cervical / Trunk Assessment: Normal  Communication   Communication: No difficulties  Cognition Arousal/Alertness: Awake/alert Behavior During Therapy: Anxious Overall Cognitive Status: No  family/caregiver present to determine baseline cognitive functioning                                          General Comments      Exercises     Assessment/Plan    PT Assessment All further PT needs can be met in the next venue of care  PT Problem List Decreased strength;Decreased activity tolerance;Decreased balance;Decreased mobility       PT Treatment Interventions      PT Goals (Current goals can be found in the Care Plan section)  Acute Rehab PT Goals Patient Stated Goal: return  home with family to assist PT Goal Formulation: With patient Time For Goal Achievement: 02/25/23 Potential to Achieve Goals: Good    Frequency       Co-evaluation               AM-PAC PT "6 Clicks" Mobility  Outcome Measure Help needed turning from your back to your side while in a flat bed without using bedrails?: A Lot Help needed moving from lying on your back to sitting on the side of a flat bed without using bedrails?: A Lot Help needed moving to and from a bed to a chair (including a wheelchair)?: A Lot Help needed standing up from a chair using your arms (e.g., wheelchair or bedside chair)?: A Lot Help needed to walk in hospital room?: Total Help needed climbing 3-5 steps with a railing? : Total 6 Click  Score: 10    End of Session   Activity Tolerance: Patient tolerated treatment well;Patient limited by fatigue Patient left: in chair;with call bell/phone within reach Nurse Communication: Mobility status PT Visit Diagnosis: Other abnormalities of gait and mobility (R26.89);Unsteadiness on feet (R26.81);Muscle weakness (generalized) (M62.81)    Time: 1610-9604 PT Time Calculation (min) (ACUTE ONLY): 15 min   Charges:   PT Evaluation $PT Eval Low Complexity: 1 Low PT Treatments $Therapeutic Activity: 8-22 mins        12:40 PM, 02/25/23 Ocie Bob, MPT Physical Therapist with [facility] [phone] office [phone] mobile  phone

## 2023-02-25 NOTE — Telephone Encounter (Signed)
CBC done and released. Phoned and LM with her spouse regarding the bloodwork since the pt was in bed asleep. I will phone her back tomorrow and make sure she is aware of this

## 2023-02-25 NOTE — Addendum Note (Signed)
Addended by: Armstead Peaks on: 02/25/2023 02:46 PM   Modules accepted: Orders

## 2023-02-26 ENCOUNTER — Other Ambulatory Visit: Payer: Self-pay

## 2023-02-26 ENCOUNTER — Encounter (HOSPITAL_COMMUNITY): Payer: Self-pay

## 2023-02-26 ENCOUNTER — Inpatient Hospital Stay (HOSPITAL_COMMUNITY)
Admission: EM | Admit: 2023-02-26 | Discharge: 2023-03-09 | DRG: 378 | Disposition: A | Discharge status: Home / Self Care | Payer: Medicare PPO | Attending: Internal Medicine | Admitting: Internal Medicine

## 2023-02-26 DIAGNOSIS — E1149 Type 2 diabetes mellitus with other diabetic neurological complication: Secondary | ICD-10-CM | POA: Diagnosis not present

## 2023-02-26 DIAGNOSIS — K5731 Diverticulosis of large intestine without perforation or abscess with bleeding: Secondary | ICD-10-CM | POA: Diagnosis present

## 2023-02-26 DIAGNOSIS — F32A Depression, unspecified: Secondary | ICD-10-CM | POA: Diagnosis present

## 2023-02-26 DIAGNOSIS — K625 Hemorrhage of anus and rectum: Secondary | ICD-10-CM | POA: Diagnosis not present

## 2023-02-26 DIAGNOSIS — Z800 Family history of malignant neoplasm of digestive organs: Secondary | ICD-10-CM | POA: Diagnosis not present

## 2023-02-26 DIAGNOSIS — K269 Duodenal ulcer, unspecified as acute or chronic, without hemorrhage or perforation: Secondary | ICD-10-CM | POA: Diagnosis present

## 2023-02-26 DIAGNOSIS — R451 Restlessness and agitation: Secondary | ICD-10-CM | POA: Diagnosis present

## 2023-02-26 DIAGNOSIS — K5730 Diverticulosis of large intestine without perforation or abscess without bleeding: Secondary | ICD-10-CM | POA: Diagnosis not present

## 2023-02-26 DIAGNOSIS — I130 Hypertensive heart and chronic kidney disease with heart failure and stage 1 through stage 4 chronic kidney disease, or unspecified chronic kidney disease: Secondary | ICD-10-CM | POA: Diagnosis present

## 2023-02-26 DIAGNOSIS — D649 Anemia, unspecified: Secondary | ICD-10-CM

## 2023-02-26 DIAGNOSIS — N184 Chronic kidney disease, stage 4 (severe): Secondary | ICD-10-CM

## 2023-02-26 DIAGNOSIS — Z79899 Other long term (current) drug therapy: Secondary | ICD-10-CM

## 2023-02-26 DIAGNOSIS — K5000 Crohn's disease of small intestine without complications: Secondary | ICD-10-CM | POA: Diagnosis present

## 2023-02-26 DIAGNOSIS — Z4659 Encounter for fitting and adjustment of other gastrointestinal appliance and device: Secondary | ICD-10-CM | POA: Diagnosis not present

## 2023-02-26 DIAGNOSIS — G4700 Insomnia, unspecified: Secondary | ICD-10-CM | POA: Diagnosis present

## 2023-02-26 DIAGNOSIS — K922 Gastrointestinal hemorrhage, unspecified: Secondary | ICD-10-CM | POA: Diagnosis present

## 2023-02-26 DIAGNOSIS — E1322 Other specified diabetes mellitus with diabetic chronic kidney disease: Secondary | ICD-10-CM

## 2023-02-26 DIAGNOSIS — E119 Type 2 diabetes mellitus without complications: Secondary | ICD-10-CM

## 2023-02-26 DIAGNOSIS — I10 Essential (primary) hypertension: Secondary | ICD-10-CM | POA: Diagnosis not present

## 2023-02-26 DIAGNOSIS — E782 Mixed hyperlipidemia: Secondary | ICD-10-CM | POA: Diagnosis present

## 2023-02-26 DIAGNOSIS — Z888 Allergy status to other drugs, medicaments and biological substances status: Secondary | ICD-10-CM

## 2023-02-26 DIAGNOSIS — E1165 Type 2 diabetes mellitus with hyperglycemia: Secondary | ICD-10-CM | POA: Diagnosis present

## 2023-02-26 DIAGNOSIS — I2510 Atherosclerotic heart disease of native coronary artery without angina pectoris: Secondary | ICD-10-CM | POA: Diagnosis present

## 2023-02-26 DIAGNOSIS — K2971 Gastritis, unspecified, with bleeding: Secondary | ICD-10-CM | POA: Diagnosis present

## 2023-02-26 DIAGNOSIS — Z9889 Other specified postprocedural states: Secondary | ICD-10-CM | POA: Diagnosis not present

## 2023-02-26 DIAGNOSIS — Z794 Long term (current) use of insulin: Secondary | ICD-10-CM

## 2023-02-26 DIAGNOSIS — I5032 Chronic diastolic (congestive) heart failure: Secondary | ICD-10-CM | POA: Diagnosis not present

## 2023-02-26 DIAGNOSIS — Z833 Family history of diabetes mellitus: Secondary | ICD-10-CM | POA: Diagnosis not present

## 2023-02-26 DIAGNOSIS — K921 Melena: Secondary | ICD-10-CM | POA: Diagnosis present

## 2023-02-26 DIAGNOSIS — Z9842 Cataract extraction status, left eye: Secondary | ICD-10-CM

## 2023-02-26 DIAGNOSIS — L89312 Pressure ulcer of right buttock, stage 2: Secondary | ICD-10-CM | POA: Diagnosis present

## 2023-02-26 DIAGNOSIS — E871 Hypo-osmolality and hyponatremia: Secondary | ICD-10-CM | POA: Diagnosis not present

## 2023-02-26 DIAGNOSIS — K219 Gastro-esophageal reflux disease without esophagitis: Secondary | ICD-10-CM | POA: Diagnosis present

## 2023-02-26 DIAGNOSIS — K50011 Crohn's disease of small intestine with rectal bleeding: Secondary | ICD-10-CM | POA: Diagnosis not present

## 2023-02-26 DIAGNOSIS — E8721 Acute metabolic acidosis: Secondary | ICD-10-CM | POA: Diagnosis present

## 2023-02-26 DIAGNOSIS — K264 Chronic or unspecified duodenal ulcer with hemorrhage: Principal | ICD-10-CM | POA: Diagnosis present

## 2023-02-26 DIAGNOSIS — Z9189 Other specified personal risk factors, not elsewhere classified: Secondary | ICD-10-CM | POA: Diagnosis not present

## 2023-02-26 DIAGNOSIS — F419 Anxiety disorder, unspecified: Secondary | ICD-10-CM | POA: Diagnosis present

## 2023-02-26 DIAGNOSIS — N3000 Acute cystitis without hematuria: Secondary | ICD-10-CM | POA: Diagnosis not present

## 2023-02-26 DIAGNOSIS — N390 Urinary tract infection, site not specified: Secondary | ICD-10-CM | POA: Diagnosis present

## 2023-02-26 DIAGNOSIS — I159 Secondary hypertension, unspecified: Secondary | ICD-10-CM

## 2023-02-26 DIAGNOSIS — I774 Celiac artery compression syndrome: Secondary | ICD-10-CM | POA: Diagnosis not present

## 2023-02-26 DIAGNOSIS — I509 Heart failure, unspecified: Secondary | ICD-10-CM | POA: Diagnosis not present

## 2023-02-26 DIAGNOSIS — Z91048 Other nonmedicinal substance allergy status: Secondary | ICD-10-CM

## 2023-02-26 DIAGNOSIS — D62 Acute posthemorrhagic anemia: Secondary | ICD-10-CM

## 2023-02-26 DIAGNOSIS — K2091 Esophagitis, unspecified with bleeding: Secondary | ICD-10-CM | POA: Diagnosis not present

## 2023-02-26 DIAGNOSIS — E1122 Type 2 diabetes mellitus with diabetic chronic kidney disease: Secondary | ICD-10-CM | POA: Diagnosis present

## 2023-02-26 DIAGNOSIS — Z8719 Personal history of other diseases of the digestive system: Secondary | ICD-10-CM | POA: Diagnosis not present

## 2023-02-26 DIAGNOSIS — Z8673 Personal history of transient ischemic attack (TIA), and cerebral infarction without residual deficits: Secondary | ICD-10-CM | POA: Diagnosis not present

## 2023-02-26 DIAGNOSIS — Z8711 Personal history of peptic ulcer disease: Secondary | ICD-10-CM

## 2023-02-26 DIAGNOSIS — Z882 Allergy status to sulfonamides status: Secondary | ICD-10-CM

## 2023-02-26 DIAGNOSIS — Z9841 Cataract extraction status, right eye: Secondary | ICD-10-CM

## 2023-02-26 DIAGNOSIS — M109 Gout, unspecified: Secondary | ICD-10-CM | POA: Diagnosis present

## 2023-02-26 DIAGNOSIS — K279 Peptic ulcer, site unspecified, unspecified as acute or chronic, without hemorrhage or perforation: Secondary | ICD-10-CM | POA: Diagnosis not present

## 2023-02-26 DIAGNOSIS — K5641 Fecal impaction: Secondary | ICD-10-CM | POA: Diagnosis not present

## 2023-02-26 DIAGNOSIS — I110 Hypertensive heart disease with heart failure: Secondary | ICD-10-CM | POA: Diagnosis not present

## 2023-02-26 DIAGNOSIS — E1140 Type 2 diabetes mellitus with diabetic neuropathy, unspecified: Secondary | ICD-10-CM | POA: Diagnosis present

## 2023-02-26 DIAGNOSIS — K209 Esophagitis, unspecified without bleeding: Secondary | ICD-10-CM | POA: Diagnosis not present

## 2023-02-26 DIAGNOSIS — Z961 Presence of intraocular lens: Secondary | ICD-10-CM | POA: Diagnosis present

## 2023-02-26 DIAGNOSIS — K254 Chronic or unspecified gastric ulcer with hemorrhage: Secondary | ICD-10-CM | POA: Diagnosis not present

## 2023-02-26 LAB — TYPE AND SCREEN

## 2023-02-26 LAB — CBC WITH DIFFERENTIAL/PLATELET
Abs Immature Granulocytes: 0.05 10*3/uL (ref 0.00–0.07)
Basophils Absolute: 0.1 10*3/uL (ref 0.0–0.1)
Basophils Relative: 1 %
Eosinophils Absolute: 0.2 10*3/uL (ref 0.0–0.5)
Eosinophils Relative: 3 %
HCT: 27.3 % — ABNORMAL LOW (ref 36.0–46.0)
Hemoglobin: 9 g/dL — ABNORMAL LOW (ref 12.0–15.0)
Immature Granulocytes: 1 %
Lymphocytes Relative: 8 %
Lymphs Abs: 0.6 10*3/uL — ABNORMAL LOW (ref 0.7–4.0)
MCH: 31.3 pg (ref 26.0–34.0)
MCHC: 33 g/dL (ref 30.0–36.0)
MCV: 94.8 fL (ref 80.0–100.0)
Monocytes Absolute: 0.3 10*3/uL (ref 0.1–1.0)
Monocytes Relative: 4 %
Neutro Abs: 7.1 10*3/uL (ref 1.7–7.7)
Neutrophils Relative %: 83 %
Platelets: 171 10*3/uL (ref 150–400)
RBC: 2.88 MIL/uL — ABNORMAL LOW (ref 3.87–5.11)
RDW: 17.2 % — ABNORMAL HIGH (ref 11.5–15.5)
WBC: 8.5 10*3/uL (ref 4.0–10.5)
nRBC: 0 % (ref 0.0–0.2)

## 2023-02-26 LAB — COMPREHENSIVE METABOLIC PANEL
ALT: 9 U/L (ref 0–44)
AST: 14 U/L — ABNORMAL LOW (ref 15–41)
Albumin: 1.8 g/dL — ABNORMAL LOW (ref 3.5–5.0)
Alkaline Phosphatase: 78 U/L (ref 38–126)
Anion gap: 8 (ref 5–15)
BUN: 69 mg/dL — ABNORMAL HIGH (ref 8–23)
CO2: 17 mmol/L — ABNORMAL LOW (ref 22–32)
Calcium: 7.9 mg/dL — ABNORMAL LOW (ref 8.9–10.3)
Chloride: 107 mmol/L (ref 98–111)
Creatinine, Ser: 2.22 mg/dL — ABNORMAL HIGH (ref 0.44–1.00)
GFR, Estimated: 23 mL/min — ABNORMAL LOW (ref >60)
Glucose, Bld: 328 mg/dL — ABNORMAL HIGH (ref 70–99)
Potassium: 3.7 mmol/L (ref 3.5–5.1)
Sodium: 132 mmol/L — ABNORMAL LOW (ref 135–145)
Total Bilirubin: 0.7 mg/dL (ref 0.3–1.2)
Total Protein: 4.3 g/dL — ABNORMAL LOW (ref 6.5–8.1)

## 2023-02-26 LAB — GLUCOSE, CAPILLARY: Glucose-Capillary: 294 mg/dL — ABNORMAL HIGH (ref 70–99)

## 2023-02-26 MED ORDER — SODIUM BICARBONATE 650 MG PO TABS
650.0000 mg | ORAL_TABLET | Freq: Every day | ORAL | Status: DC
  Administered 2023-02-27 – 2023-03-03 (×5): 650 mg via ORAL
  Filled 2023-02-26 (×6): qty 1

## 2023-02-26 MED ORDER — INSULIN ASPART 100 UNIT/ML IJ SOLN
0.0000 [IU] | INTRAMUSCULAR | Status: DC
  Administered 2023-02-26: 8 [IU] via SUBCUTANEOUS
  Administered 2023-02-27: 5 [IU] via SUBCUTANEOUS
  Administered 2023-02-27: 3 [IU] via SUBCUTANEOUS
  Administered 2023-02-27 – 2023-03-01 (×4): 2 [IU] via SUBCUTANEOUS
  Administered 2023-03-01: 3 [IU] via SUBCUTANEOUS
  Administered 2023-03-02: 2 [IU] via SUBCUTANEOUS
  Administered 2023-03-02: 5 [IU] via SUBCUTANEOUS
  Administered 2023-03-03 (×2): 8 [IU] via SUBCUTANEOUS
  Administered 2023-03-03: 3 [IU] via SUBCUTANEOUS
  Administered 2023-03-04: 11 [IU] via SUBCUTANEOUS
  Administered 2023-03-04 – 2023-03-05 (×4): 3 [IU] via SUBCUTANEOUS
  Administered 2023-03-05: 8 [IU] via SUBCUTANEOUS
  Administered 2023-03-06: 15 [IU] via SUBCUTANEOUS
  Administered 2023-03-06: 2 [IU] via SUBCUTANEOUS
  Administered 2023-03-07: 8 [IU] via SUBCUTANEOUS
  Administered 2023-03-07: 3 [IU] via SUBCUTANEOUS
  Administered 2023-03-07 (×2): 11 [IU] via SUBCUTANEOUS
  Administered 2023-03-07: 5 [IU] via SUBCUTANEOUS
  Administered 2023-03-07: 11 [IU] via SUBCUTANEOUS
  Administered 2023-03-08: 5 [IU] via SUBCUTANEOUS
  Administered 2023-03-08: 8 [IU] via SUBCUTANEOUS
  Administered 2023-03-08: 3 [IU] via SUBCUTANEOUS
  Administered 2023-03-08: 15 [IU] via SUBCUTANEOUS
  Administered 2023-03-08: 3 [IU] via SUBCUTANEOUS
  Administered 2023-03-08: 15 [IU] via SUBCUTANEOUS
  Administered 2023-03-09: 2 [IU] via SUBCUTANEOUS

## 2023-02-26 MED ORDER — LEVETIRACETAM 500 MG PO TABS
500.0000 mg | ORAL_TABLET | Freq: Two times a day (BID) | ORAL | Status: DC
  Administered 2023-02-26 – 2023-03-09 (×21): 500 mg via ORAL
  Filled 2023-02-26 (×21): qty 1

## 2023-02-26 MED ORDER — SODIUM CHLORIDE 0.9 % IV SOLN: INTRAVENOUS | Status: AC

## 2023-02-26 MED ORDER — ALLOPURINOL 100 MG PO TABS
100.0000 mg | ORAL_TABLET | Freq: Every day | ORAL | Status: DC
  Administered 2023-02-27 – 2023-03-09 (×10): 100 mg via ORAL
  Filled 2023-02-26 (×10): qty 1

## 2023-02-26 MED ORDER — PANTOPRAZOLE SODIUM 40 MG IV SOLR
40.0000 mg | Freq: Once | INTRAVENOUS | Status: AC
  Administered 2023-02-26: 40 mg via INTRAVENOUS
  Filled 2023-02-26: qty 10

## 2023-02-26 MED ORDER — POLYETHYLENE GLYCOL 3350 17 G PO PACK: 17.0000 g | PACK | Freq: Every day | ORAL | Status: DC | PRN

## 2023-02-26 MED ORDER — ACETAMINOPHEN 650 MG RE SUPP: 650.0000 mg | Freq: Four times a day (QID) | RECTAL | Status: DC | PRN

## 2023-02-26 MED ORDER — CEFDINIR 300 MG PO CAPS: 300.0000 mg | ORAL_CAPSULE | Freq: Two times a day (BID) | ORAL | Status: DC

## 2023-02-26 MED ORDER — ACETAMINOPHEN 325 MG PO TABS
650.0000 mg | ORAL_TABLET | Freq: Four times a day (QID) | ORAL | Status: DC | PRN
  Administered 2023-02-27 – 2023-03-06 (×10): 650 mg via ORAL
  Filled 2023-02-26 (×10): qty 2

## 2023-02-26 MED ORDER — LACTATED RINGERS IV BOLUS: 1000.0000 mL | Freq: Once | INTRAVENOUS | Status: DC

## 2023-02-26 MED ORDER — PANTOPRAZOLE SODIUM 40 MG IV SOLR: 40.0000 mg | INTRAVENOUS | Status: DC

## 2023-02-26 MED ORDER — MORPHINE SULFATE (PF) 2 MG/ML IV SOLN
2.0000 mg | Freq: Once | INTRAVENOUS | Status: AC
  Administered 2023-02-26: 2 mg via INTRAVENOUS
  Filled 2023-02-26: qty 1

## 2023-02-26 NOTE — H&P (Signed)
History and Physical    Heather [name] RUE:[id number] DOB: 1951-02-27 DOA: 02/26/2023  PCP: [name], MD   Patient coming from: Home  I have personally briefly reviewed patient's old medical records in [facility]  Chief Complaint: Blood per rectum  HPI: Heather [name] is a 72 y.o. female with medical history significant for GI bleed, CKD 4, CVA, D CHF, Crohn's, DM, HTN. Patient presented to the ED with complaints of one episode of blood per rectum today.   Patient was just discharged from the hospital yesterday after hospitalization 4/21 to 4/29 also for GI bleed for which she required 3 units of blood, for severe anemia of hemoglobin 3.2.she underwent EGD and flex sig 4/23 and repeat EGD 4/26 due to persistent bleeding.  EGD 4/23 showed esophageal ulcers esophagitis, oozing duodenal ulcers and clots she -she had injection and clips.  EGD 4/26 showed visible vessel with ulcer base , epi and clip placement.  Since going home she has noted had a normal bowel movement, today she had bleeding from her rectum without stool.  Blood was described as dark red, and she does not think it was a large quantity.  No abdominal pain.  No rectal pain.  No vomiting.  She reports dizziness when sitting up.  No chest pain or difficulty breathing.  She has not had further bleeding since being here in the ED.  ED Course: Heart rate 96-104.  Blood pressure 140s systolic.  Hemoglobin 9. EDP talked to GI Dr. Marletta Lor GI will see in AM.  Recommended clear liquid diet, IV Protonix.  Review of Systems: As per HPI all other systems reviewed and negative.  Past Medical History:  Diagnosis Date   Acute renal failure superimposed on stage 3 chronic kidney disease (HCC) 01/21/2016   Anemia    Anxiety    B12 deficiency    Brain tumor (benign) (HCC) 10/29/2020   Cellulitis and abscess    Abdomen and buttocks   Chronic diastolic CHF (congestive heart failure) (HCC) 03/16/2022   CKD (chronic kidney  disease) stage 3, GFR 30-59 ml/min (HCC)    Closed fracture of distal end of femur, unspecified fracture morphology, initial encounter (HCC)    Coronary arteriosclerosis 07/15/2018   Crohn disease (HCC)    Depression    Diabetic neuropathy (HCC)    feet   DJD (degenerative joint disease)    Right forminal stenosis C4-5   Essential hypertension    Femur fracture, right (HCC) 03/30/2017   Folliculitis    GERD (gastroesophageal reflux disease)    Gout    Headache(784.0)    Hiatal hernia    History of blood transfusion    History of bronchitis    History of cardiac catheterization    No significant CAD 2012   History of kidney stones    surgery to remove   Insomnia    Irritable bowel syndrome    Mixed hyperlipidemia    Nausea & vomiting 12/22/2014   Osteoarthritis    Palpitations    Pneumonia    Reflux esophagitis    Salmonella    Stroke (HCC)    Tinnitus    Right   Type 2 diabetes mellitus (HCC)     Past Surgical History:  Procedure Laterality Date   BACK SURGERY     BIOPSY  02/19/2023   Procedure: BIOPSY;  Surgeon: Dolores Frame, MD;  [facility]: [facility];  Service: Gastroenterology;;   c-spine surgery     03/2007  CARDIAC CATHETERIZATION     CHOLECYSTECTOMY     COLONOSCOPY  04/21/2011; 05/29/11   6/12: [facility] - ?AVM at IC valve, inflammatory changes at IC valve [facility]); 7/12 - [facility]; IC valve erosions, look chronic and probably Crohn's per path   colonoscopy  2017   [facility]: random biopsies normal. TI normal   ESOPHAGOGASTRODUODENOSCOPY  05/29/11   normal   ESOPHAGOGASTRODUODENOSCOPY N/A 01/21/2016   Dr. Bosie Clos: normal   ESOPHAGOGASTRODUODENOSCOPY (EGD) WITH PROPOFOL N/A 02/19/2023   Procedure: ESOPHAGOGASTRODUODENOSCOPY (EGD) WITH PROPOFOL;  Surgeon: Dolores Frame, MD;  Location: [facility];  Service: Gastroenterology;  Laterality: N/A;   EYE SURGERY Bilateral    cataracts removed   FEMUR IM NAIL Right 04/10/2018    Procedure: INTRAMEDULLARY (IM) NAIL FEMORAL;  Surgeon: Durene Romans, MD;  Location: [facility];  Service: Orthopedics;  Laterality: Right;   FLEXIBLE SIGMOIDOSCOPY N/A 02/19/2023   Procedure: FLEXIBLE SIGMOIDOSCOPY;  Surgeon: Dolores Frame, MD;  [facility]: [facility];  Service: Gastroenterology;  Laterality: N/A;   FRACTURE SURGERY     GIVENS CAPSULE STUDY  11/17/2020   HARDWARE REMOVAL Right 04/10/2018   Procedure: HARDWARE REMOVAL RIGHT DISTAL FEMUR;  Surgeon: Durene Romans, MD;  Location: [facility];  Service: Orthopedics;  Laterality: Right;   HEMOSTASIS CLIP PLACEMENT  02/19/2023   Procedure: HEMOSTASIS CLIP PLACEMENT;  Surgeon: Dolores Frame, MD;  [facility]: [facility];  Service: Gastroenterology;;   HEMOSTASIS CONTROL  02/19/2023   Procedure: HEMOSTASIS CONTROL;  Surgeon: Dolores Frame, MD;  [facility]: [facility];  Service: Gastroenterology;;   HERNIA REPAIR     IR FLUORO GUIDE CV LINE RIGHT  04/01/2017   IR US GUIDE VASC ACCESS RIGHT  04/01/2017   JOINT REPLACEMENT Right    hip   OPEN REDUCTION INTERNAL FIXATION (ORIF) DISTAL RADIAL FRACTURE Right 05/28/2019   Procedure: OPEN REDUCTION INTERNAL FIXATION (ORIF) DISTAL RADIAL FRACTURE;  Surgeon: Ernest Mallick, MD;  Location: [facility];  Service: Orthopedics;  Laterality: Right;  with block 90 minutes   ORIF FEMUR FRACTURE Right 03/31/2017   Procedure: OPEN REDUCTION INTERNAL FIXATION (ORIF) DISTAL FEMUR FRACTURE;  Surgeon: Durene Romans, MD;  Location: [facility] OR;  Service: Orthopedics;  Laterality: Right;   ORIF FEMUR FRACTURE Right 12/14/2022   Procedure: OPEN REDUCTION INTERNAL FIXATION (ORIF) DISTAL FEMUR FRACTURE;  Surgeon: Roby Lofts, MD;  Location: [facility];  Service: Orthopedics;  Laterality: Right;   removal of kidney stone     Right knee arthroscopic surgery     TONSILLECTOMY     TOTAL ABDOMINAL HYSTERECTOMY       reports that she has never smoked. She has never used smokeless tobacco. She  reports that she does not drink alcohol and does not use drugs.  Allergies  Allergen Reactions   Cozaar [Losartan] Nausea And Vomiting, Swelling and Rash   Quinolones Itching, Nausea And Vomiting, Nausea Only and Swelling    Swelling of face, jaw, and lips    Cymbalta [Duloxetine Hcl] Itching and Swelling   Sulfa Antibiotics Nausea And Vomiting and Swelling    Headache  Swelling of feet, legs    Synalar [Fluocinolone] Swelling   Cipro [Ciprofloxacin Hcl] Nausea And Vomiting and Swelling    Swelling of face, jaw, lips   Diovan [Valsartan] Nausea And Vomiting and Swelling   Glucophage [Metformin] Other (See Comments)    GI upset   Ketalar [Ketamine] Swelling   Norco [Hydrocodone-Acetaminophen] Itching and Swelling   Norvasc [Amlodipine Besylate] Itching and  Swelling    Fluid retention    Nsaids Nausea And Vomiting and Other (See Comments)    Has bleeding ulcers   Tape Itching and Rash    Paper tape can only be used on this patient   Zestril [Lisinopril] Swelling and Rash    Oral swelling Red streaks on arms/legs    Family History  Problem Relation Age of Onset   Diabetes Mother    Colon cancer Brother        POSSIBLY. Patient states diagnosed at age 65, then later states age 39. unclear and limited historian   Esophageal cancer Neg Hx    Rectal cancer Neg Hx    Stomach cancer Neg Hx     Prior to Admission medications   Medication Sig Start Date End Date Taking? Authorizing Provider  allopurinol (ZYLOPRIM) 100 MG tablet Take 100 mg by mouth daily. 03/20/21  Yes [provider]  atorvastatin (LIPITOR) 80 MG tablet Take 1 tablet (80 mg total) by mouth daily. Restart on 06/29/22 Patient taking differently: Take 80 mg by mouth at bedtime. Restart on 06/29/22 06/22/22  Yes Tat, Onalee Hua, MD  cefdinir (OMNICEF) 300 MG capsule Take 1 capsule (300 mg total) by mouth 2 (two) times daily for 5 days. 02/25/23 03/02/23 Yes Shahmehdi, Gemma Payor, MD  esomeprazole (NEXIUM) 40 MG capsule Take  1 capsule (40 mg total) by mouth 2 (two) times daily before a meal. 02/25/23 03/27/23 Yes Shahmehdi, Seyed A, MD  HYDROcodone-acetaminophen (NORCO/VICODIN) 5-325 MG tablet Take 1 tablet by mouth every 6 (six) hours as needed.   Yes [provider]  insulin aspart (NOVOLOG) 100 UNIT/ML FlexPen Inject 2 Units into the skin 3 (three) times daily with meals. Give only if eats 50% or more of meal. Patient taking differently: Inject 2-5 Units into the skin 3 (three) times daily with meals. Give only if eats 50% or more of meal. 02/11/23  Yes Johnson, Clanford L, MD  insulin glargine-yfgn (SEMGLEE) 100 UNIT/ML Pen Inject 7 Units into the skin daily. Patient taking differently: Inject 15 Units into the skin daily. 02/11/23  Yes Johnson, Clanford L, MD  lactobacillus acidophilus & bulgar (LACTINEX) chewable tablet Chew 1 tablet by mouth 3 (three) times daily with meals for 10 days. 02/25/23 03/07/23 Yes Shahmehdi, Gemma Payor, MD  levETIRAcetam (KEPPRA) 500 MG tablet Take 500 mg by mouth 2 (two) times daily.   Yes [provider]  metoprolol succinate (TOPROL-XL) 25 MG 24 hr tablet Take 1.5 tablets (37.5 mg total) by mouth daily. 02/11/23  Yes Johnson, Clanford L, MD  Nutritional Supplements (,FEEDING SUPPLEMENT, PROSOURCE PLUS) liquid Take 30 mLs by mouth 3 (three) times daily between meals. 02/25/23 03/27/23 Yes Shahmehdi, Seyed A, MD  ondansetron (ZOFRAN-ODT) 4 MG disintegrating tablet Take 4 mg by mouth every 6 (six) hours as needed. 01/21/23  Yes [provider]  sodium bicarbonate 650 MG tablet Take 1 tablet (650 mg total) by mouth daily. 02/11/23  Yes Johnson, Clanford L, MD  venlafaxine XR (EFFEXOR-XR) 150 MG 24 hr capsule Take 150 mg by mouth daily. 07/11/22  Yes [provider]  alum & mag hydroxide-simeth (MAALOX MAX) 400-400-40 MG/5ML suspension Take 10 mLs by mouth every 8 (eight) hours for 12 days. 02/25/23 03/09/23  Kendell Bane, MD    Physical Exam: Vitals:   02/26/23  1424 02/26/23 1434 02/26/23 1723 02/26/23 1957  BP:   (!) 142/69 (!) 148/79  Pulse:   100 96  Resp: 18  18  Temp:   97.7 F (36.5 C) 98 F (36.7 C)  TempSrc:   Oral Oral  SpO2:   100% 100%  Weight:  55.9 kg    Height:  5\' 4"  (1.626 m)      Constitutional: NAD, calm, comfortable Vitals:   02/26/23 1424 02/26/23 1434 02/26/23 1723 02/26/23 1957  BP:   (!) 142/69 (!) 148/79  Pulse:   100 96  Resp: 18  18   Temp:   97.7 F (36.5 C) 98 F (36.7 C)  TempSrc:   Oral Oral  SpO2:   100% 100%  Weight:  55.9 kg    Height:  5\' 4"  (1.626 m)     Eyes: PERRL, lids and conjunctivae normal ENMT: Mucous membranes are moist.   Neck: normal, supple, no masses, no thyromegaly Respiratory: clear to auscultation bilaterally, no wheezing, no crackles. Normal respiratory effort. No accessory muscle use.  Cardiovascular: Regular rate and rhythm, no murmurs / rubs / gallops. No extremity edema.  Extremities warm Abdomen: no tenderness, no masses palpated. No hepatosplenomegaly. Bowel sounds positive.  Musculoskeletal: no clubbing / cyanosis. No joint deformity upper and lower extremities.  Skin: no rashes, lesions, ulcers. No induration Neurologic: No facial asymmetry, moving extremities spontaneously, speech clear without aphasia.  Psychiatric: Normal judgment and insight. Alert and oriented x 3. Normal mood.   Labs on Admission: I have personally reviewed following labs and imaging studies  CBC: Recent Labs  Lab 02/22/23 0423 02/22/23 1114 02/23/23 0434 02/24/23 0452 02/25/23 0946 02/26/23 1511  WBC 5.1  --  5.4 7.6 9.2 8.5  NEUTROABS  --   --   --   --   --  7.1  HGB 7.1* 7.7* 11.2* 11.2* 9.6* 9.0*  HCT 22.9* 24.1* 33.5* 33.6* 29.6* 27.3*  MCV 97.0  --  91.8 91.6 96.7 94.8  PLT 162  --  155 191 196 171   Basic Metabolic Panel: Recent Labs  Lab 02/22/23 0423 02/23/23 0434 02/24/23 0452 02/25/23 0602 02/26/23 1511  NA 135 136 137 139 132*  K 4.0 3.6 3.9 3.6 3.7  CL 108 108  110 111 107  CO2 18* 19* 19* 17* 17*  GLUCOSE 118* 113* 79 208* 328*  BUN 92* 83* 82* 78* 69*  CREATININE 2.50* 2.36* 2.33* 2.43* 2.22*  CALCIUM 7.6* 8.0* 8.1* 8.0* 7.9*   GFR: Estimated Creatinine Clearance: 20.4 mL/min (A) (by C-G formula based on SCr of 2.22 mg/dL (H)). Liver Function Tests: Recent Labs  Lab 02/26/23 1511  AST 14*  ALT 9  ALKPHOS 78  BILITOT 0.7  PROT 4.3*  ALBUMIN 1.8*   CBG: Recent Labs  Lab 02/24/23 2032 02/25/23 0009 02/25/23 0446 02/25/23 0753 02/25/23 1132  GLUCAP 201* 212* 203* 186* 239*    Radiological Exams on Admission: No results found.  EKG: None   Assessment/Plan Principal Problem:   GI bleed Active Problems:   ABLA (acute blood loss anemia)   Chronic diastolic CHF (congestive heart failure) (HCC)   Diabetes mellitus (HCC)   Chronic kidney disease (CKD), stage IV (severe) (HCC)   Crohn's disease of small intestine without complication (HCC)   HTN (hypertension)   Assessment and Plan: * GI bleed Hemoglobin 9 today, Hemoglobin was up to 11- 4/28.1 episode of blood per rectum today.  Recent hospitalization 4/21 to 4/23 for same with severe anemia of 3.2 requiring 3 units PRBC.  She underwent EGD and flex sig 4/23 and repeat EGD 4/26 due to persistent bleeding.  EGD 4/23 showed  esophageal ulcers esophagitis, oozing duodenal ulcers and clots she -she had injection and clips.  EGD 4/26 showed visible vessel with ulcer base , epi and clip placement. - EDP talked to Dr. Marletta Lor, will see in consult clear liquid diet -N.p.o. midnight -IV Protonix 40 twice daily -H&H -Transfuse for hemoglobin less than 8 - N/s 75cc/hr x 12hrs  Chronic diastolic CHF (congestive heart failure) (HCC) Stable and compensated.  Last echo 11/2022, EF of 60 to 65%, grade 1 DD.  Not on diuretic.  Diabetes mellitus (HCC) With hyperglycemia, glucose 328.  Diabetes controlled.  A1c 6.4. - Hold home Lantus - SSI- M  UTI (urinary tract infection) Diagnosed  with UTI on discharge, started on Rocephin 4/27 and discharged on Omnicef. Was also on antibiotics for proctocolitis prior to this-started 4/21.  Urine cultures 4/24 grew less than 10,000 colonies-insignificant growth. -I believe she has completed required course of antibiotics.  Will hold for antibiotics for now.  HTN (hypertension) Stable. -Hold metoprolol for now in the setting of GI bleed  Crohn's disease of small intestine without complication (HCC) Stable.  Not on medication.  Chronic kidney disease (CKD), stage IV (severe) (HCC) Cr- 2.2.  At baseline. -Resume oral sodium bicarb   DVT prophylaxis: SCDS Code Status: FULL code- confirmed with patient and spouse at bedside Family Communication: Spouse at bedside. Disposition Plan:  ~ 2 days Consults called: GI Admission status:  Obs tele     Author: Onnie Boer, MD 02/26/2023 8:44 PM  For on call review [email or web address].

## 2023-02-26 NOTE — ED Provider Notes (Signed)
[place] EMERGENCY DEPARTMENT AT [facility] Provider Note   CSN: [id number] Arrival date & time: 02/26/23  1414     History  Chief Complaint  Patient presents with   Rectal Bleeding    Heather [name] is a 72 y.o. female with past medical history as outlined below returns to the hospital with continued complaint of gastrointestinal bleeding.  Patient was discharged from the hospital yesterday.  She states when she used the bathroom earlier today, she passed a large amount of dark red blood without stool. She states her bowel movement was painless.  She is complaining of headache also.  Denies fever, abdominal pain, nausea, vomiting, diarrhea, syncope, weakness, chest pain, shortness of breath.   Past Medical History:  Diagnosis Date   Acute renal failure superimposed on stage 3 chronic kidney disease (HCC) 01/21/2016   Anemia    Anxiety    B12 deficiency    Brain tumor (benign) (HCC) 10/29/2020   Cellulitis and abscess    Abdomen and buttocks   Chronic diastolic CHF (congestive heart failure) (HCC) 03/16/2022   CKD (chronic kidney disease) stage 3, GFR 30-59 ml/min (HCC)    Closed fracture of distal end of femur, unspecified fracture morphology, initial encounter (HCC)    Coronary arteriosclerosis 07/15/2018   Crohn disease (HCC)    Depression    Diabetic neuropathy (HCC)    feet   DJD (degenerative joint disease)    Right forminal stenosis C4-5   Essential hypertension    Femur fracture, right (HCC) 03/30/2017   Folliculitis    GERD (gastroesophageal reflux disease)    Gout    Headache(784.0)    Hiatal hernia    History of blood transfusion    History of bronchitis    History of cardiac catheterization    No significant CAD 2012   History of kidney stones    surgery to remove   Insomnia    Irritable bowel syndrome    Mixed hyperlipidemia    Nausea & vomiting 12/22/2014   Osteoarthritis    Palpitations    Pneumonia    Reflux esophagitis     Salmonella    Stroke [facility])    Tinnitus    Right   Type 2 diabetes mellitus (HCC)         Home Medications Prior to Admission medications   Medication Sig Start Date End Date Taking? Authorizing Provider  allopurinol (ZYLOPRIM) 100 MG tablet Take 100 mg by mouth daily. 03/20/21  Yes [provider]  atorvastatin (LIPITOR) 80 MG tablet Take 1 tablet (80 mg total) by mouth daily. Restart on 06/29/22 Patient taking differently: Take 80 mg by mouth at bedtime. Restart on 06/29/22 06/22/22  Yes Tat, Onalee Hua, MD  cefdinir (OMNICEF) 300 MG capsule Take 1 capsule (300 mg total) by mouth 2 (two) times daily for 5 days. 02/25/23 03/02/23 Yes Shahmehdi, Gemma Payor, MD  esomeprazole (NEXIUM) 40 MG capsule Take 1 capsule (40 mg total) by mouth 2 (two) times daily before a meal. 02/25/23 03/27/23 Yes Shahmehdi, Seyed A, MD  HYDROcodone-acetaminophen (NORCO/VICODIN) 5-325 MG tablet Take 1 tablet by mouth every 6 (six) hours as needed.   Yes [provider]  insulin aspart (NOVOLOG) 100 UNIT/ML FlexPen Inject 2 Units into the skin 3 (three) times daily with meals. Give only if eats 50% or more of meal. Patient taking differently: Inject 2-5 Units into the skin 3 (three) times daily with meals. Give only if eats 50% or more of meal. 02/11/23  Yes Johnson, Clanford L, MD  insulin glargine-yfgn (SEMGLEE) 100 UNIT/ML Pen Inject 7 Units into the skin daily. Patient taking differently: Inject 15 Units into the skin daily. 02/11/23  Yes Johnson, Clanford L, MD  lactobacillus acidophilus & bulgar (LACTINEX) chewable tablet Chew 1 tablet by mouth 3 (three) times daily with meals for 10 days. 02/25/23 03/07/23 Yes Shahmehdi, Gemma Payor, MD  levETIRAcetam (KEPPRA) 500 MG tablet Take 500 mg by mouth 2 (two) times daily.   Yes [provider]  metoprolol succinate (TOPROL-XL) 25 MG 24 hr tablet Take 1.5 tablets (37.5 mg total) by mouth daily. 02/11/23  Yes Johnson, Clanford L, MD  Nutritional Supplements (,FEEDING  SUPPLEMENT, PROSOURCE PLUS) liquid Take 30 mLs by mouth 3 (three) times daily between meals. 02/25/23 03/27/23 Yes Shahmehdi, Seyed A, MD  ondansetron (ZOFRAN-ODT) 4 MG disintegrating tablet Take 4 mg by mouth every 6 (six) hours as needed. 01/21/23  Yes [provider]  sodium bicarbonate 650 MG tablet Take 1 tablet (650 mg total) by mouth daily. 02/11/23  Yes Johnson, Clanford L, MD  venlafaxine XR (EFFEXOR-XR) 150 MG 24 hr capsule Take 150 mg by mouth daily. 07/11/22  Yes [provider]  alum & mag hydroxide-simeth (MAALOX MAX) 400-400-40 MG/5ML suspension Take 10 mLs by mouth every 8 (eight) hours for 12 days. 02/25/23 03/09/23  Kendell Bane, MD      Allergies    Cozaar [losartan], Quinolones, Cymbalta [duloxetine hcl], Sulfa antibiotics, Synalar [fluocinolone], Cipro [ciprofloxacin hcl], Diovan [valsartan], Glucophage [metformin], Ketalar [ketamine], Norco [hydrocodone-acetaminophen], Norvasc [amlodipine besylate], Nsaids, Tape, and Zestril [lisinopril]    Review of Systems   Review of Systems  Constitutional:  Negative for fever.  Respiratory:  Negative for shortness of breath.   Cardiovascular:  Negative for chest pain.  Gastrointestinal:  Positive for blood in stool. Negative for abdominal pain, constipation, diarrhea, nausea and vomiting.  Neurological:  Positive for headaches. Negative for syncope, weakness and light-headedness.    Physical Exam Updated Vital Signs BP (!) 142/69   Pulse 100   Temp 97.7 F (36.5 C) (Oral)   Resp 18   Ht 5\' 4"  (1.626 m)   Wt 55.9 kg   LMP  (LMP Unknown)   SpO2 100%   BMI 21.15 kg/m  Physical Exam Vitals and nursing note reviewed.  Constitutional:      General: She is not in acute distress.    Appearance: Normal appearance. She is ill-appearing. She is not toxic-appearing or diaphoretic.  Cardiovascular:     Rate and Rhythm: Normal rate and regular rhythm.  Pulmonary:     Effort: Pulmonary effort is normal.   Abdominal:     General: Abdomen is flat. Bowel sounds are normal.     Palpations: Abdomen is soft.     Tenderness: There is no abdominal tenderness.  Musculoskeletal:     Right lower leg: No edema.     Left lower leg: No edema.  Skin:    General: Skin is warm and dry.     Capillary Refill: Capillary refill takes less than 2 seconds.     Coloration: Skin is pale. Skin is not cyanotic.  Neurological:     Mental Status: She is alert and oriented to person, place, and time. Mental status is at baseline.  Psychiatric:        Mood and Affect: Mood normal.        Behavior: Behavior normal.     ED Results / Procedures / Treatments   Labs (all  labs ordered are listed, but only abnormal results are displayed) Labs Reviewed  CBC WITH DIFFERENTIAL/PLATELET - Abnormal; Notable for the following components:      Result Value   RBC 2.88 (*)    Hemoglobin 9.0 (*)    HCT 27.3 (*)    RDW 17.2 (*)    Lymphs Abs 0.6 (*)    All other components within normal limits  COMPREHENSIVE METABOLIC PANEL - Abnormal; Notable for the following components:   Sodium 132 (*)    CO2 17 (*)    Glucose, Bld 328 (*)    BUN 69 (*)    Creatinine, Ser 2.22 (*)    Calcium 7.9 (*)    Total Protein 4.3 (*)    Albumin 1.8 (*)    AST 14 (*)    GFR, Estimated 23 (*)    All other components within normal limits  TYPE AND SCREEN    EKG None  Radiology No results found.  Procedures Procedures    Medications Ordered in ED Medications  lactated ringers bolus 1,000 mL (has no administration in time range)  morphine (PF) 2 MG/ML injection 2 mg (has no administration in time range)  pantoprazole (PROTONIX) injection 40 mg (has no administration in time range)    ED Course/ Medical Decision Making/ A&P                             Medical Decision Making Amount and/or Complexity of Data Reviewed Labs: ordered.  Risk Prescription drug management.   This patient presents to the ED with chief  complaint(s) of GI bleeding with pertinent past medical history of GI bleed secondary to duodenal ulcer.  The complaint involves an extensive differential diagnosis and also carries with it a high risk of complications and morbidity.    The initial plan is to obtain baseline labs  Additional history obtained: Records reviewed previous admission documents, patient had 2 EGD procedures and sigmoidoscopy.  On EGD, patient had areas that were bleeding that needed to be clipped.  Hemostasis was achieved per review of procedures.  Sigmoidoscopy without bleeding.  Patient came in to ED with critical hemoglobin of 3.2 at time of that admission.  She received multiple transfusions.  2 days ago she had hemoglobin of 11.2.   Initial Assessment:   Exam significant for pale, ill-appearing patient who is not in acute distress.  She is moving all extremities appropriately.  She is alert and oriented.  Abdomen is soft and non tender to palpation.  No distension.    Independent ECG/labs interpretation:  The following labs were independently interpreted:  CBC with hemoglobin of 9, no leukocytosis.  Metabolic panel without major electrolyte disturbance when corrected for hyperglycemia and hypoalbuminemia.  Patient's renal function at her baseline.    Treatment and Reassessment: Plan to give IV fluids, Protonix, and morphine for pain.  There was difficulty obtaining IV access and patient had not yet received medications at time of this note.   Consultations obtained:   I requested consultation with on-call gastroenterology provider and spoke with Earnest Bailey who recommended hospital admission, clear liquid diet, and starting patient back on IV Protonix.   I requested consultation with on-call hospitalist and spoke with Kennis Carina who agreed with hospital admission.    Disposition:   Patient to be admitted to hospital for continued GI bleeding, gastroenterology to follow.           Final  Clinical Impression(s) /  ED Diagnoses Final diagnoses:  Acute GI bleeding    Rx / DC Orders ED Discharge Orders     None         Lenard Simmer, PA-C 02/26/23 1818    Eber Hong, MD 02/28/23 [id number]

## 2023-02-26 NOTE — Assessment & Plan Note (Signed)
Stable. -Hold metoprolol for now in the setting of GI bleed

## 2023-02-26 NOTE — Assessment & Plan Note (Addendum)
With hyperglycemia, glucose 328.  Diabetes controlled.  A1c 6.4. - Hold home Lantus - SSI- M

## 2023-02-26 NOTE — Assessment & Plan Note (Signed)
Stable.  Not on medication. 

## 2023-02-26 NOTE — Plan of Care (Signed)

## 2023-02-26 NOTE — ED Triage Notes (Signed)
Pt in from home with her husband, per the pts husband the pt had bleeding ulcers that required endoscopic intervention twice last week, pt had x 1 bloody stools today, A&O x4 upon arrival to ED, denies taking blood thinners

## 2023-02-26 NOTE — Telephone Encounter (Signed)
Phoned and spoke with the pt advising where/when to have blood work completed. Pt expressed understanding.

## 2023-02-26 NOTE — Assessment & Plan Note (Addendum)
Cr- 2.2.  At baseline. -Resume oral sodium bicarb

## 2023-02-26 NOTE — Assessment & Plan Note (Addendum)
Hemoglobin 9 today, Hemoglobin was up to 11- 4/28.1 episode of blood per rectum today.  Recent hospitalization 4/21 to 4/23 for same with severe anemia of 3.2 requiring 3 units PRBC.  She underwent EGD and flex sig 4/23 and repeat EGD 4/26 due to persistent bleeding.  EGD 4/23 showed esophageal ulcers esophagitis, oozing duodenal ulcers and clots she -she had injection and clips.  EGD 4/26 showed visible vessel with ulcer base , epi and clip placement. - EDP talked to Dr. Marletta Lor, will see in consult clear liquid diet -N.p.o. midnight -IV Protonix 40 twice daily -H&H -Transfuse for hemoglobin less than 8 - N/s 75cc/hr x 12hrs

## 2023-02-26 NOTE — Assessment & Plan Note (Signed)
Diagnosed with UTI on discharge, started on Rocephin 4/27 and discharged on Omnicef. Was also on antibiotics for proctocolitis prior to this-started 4/21.  Urine cultures 4/24 grew less than 10,000 colonies-insignificant growth. -I believe she has completed required course of antibiotics.  Will hold for antibiotics for now.

## 2023-02-26 NOTE — ED Provider Notes (Signed)
Ultrasound ED Peripheral IV (Provider)  Date/Time: 02/26/2023 6:10 PM  Performed by: Eber Hong, MD Authorized by: Eber Hong, MD   Procedure details:    Indications: hypotension and multiple failed IV attempts     Skin Prep: chlorhexidine gluconate     Location: left upper arm.   Bedside Ultrasound Guided: Yes     Images: not archived     Patient tolerated procedure without complications: Yes     Dressing applied: Yes        Eber Hong, MD 02/26/23 1810

## 2023-02-26 NOTE — Assessment & Plan Note (Signed)
Stable and compensated.  Last echo 11/2022, EF of 60 to 65%, grade 1 DD.  Not on diuretic.

## 2023-02-27 ENCOUNTER — Encounter (HOSPITAL_COMMUNITY): Payer: Self-pay | Admitting: Internal Medicine

## 2023-02-27 ENCOUNTER — Other Ambulatory Visit: Payer: Self-pay

## 2023-02-27 DIAGNOSIS — K625 Hemorrhage of anus and rectum: Secondary | ICD-10-CM

## 2023-02-27 DIAGNOSIS — Z9189 Other specified personal risk factors, not elsewhere classified: Secondary | ICD-10-CM

## 2023-02-27 DIAGNOSIS — K5000 Crohn's disease of small intestine without complications: Secondary | ICD-10-CM

## 2023-02-27 DIAGNOSIS — K264 Chronic or unspecified duodenal ulcer with hemorrhage: Secondary | ICD-10-CM | POA: Diagnosis not present

## 2023-02-27 DIAGNOSIS — D649 Anemia, unspecified: Secondary | ICD-10-CM

## 2023-02-27 DIAGNOSIS — K269 Duodenal ulcer, unspecified as acute or chronic, without hemorrhage or perforation: Secondary | ICD-10-CM | POA: Diagnosis not present

## 2023-02-27 DIAGNOSIS — N184 Chronic kidney disease, stage 4 (severe): Secondary | ICD-10-CM | POA: Diagnosis not present

## 2023-02-27 LAB — CBC
HCT: 29 % — ABNORMAL LOW (ref 36.0–46.0)
Hemoglobin: 9 g/dL — ABNORMAL LOW (ref 12.0–15.0)
MCH: 30.9 pg (ref 26.0–34.0)
MCHC: 31 g/dL (ref 30.0–36.0)
MCV: 99.7 fL (ref 80.0–100.0)
Platelets: 155 10*3/uL (ref 150–400)
RBC: 2.91 MIL/uL — ABNORMAL LOW (ref 3.87–5.11)
RDW: 17.1 % — ABNORMAL HIGH (ref 11.5–15.5)
WBC: 8.1 10*3/uL (ref 4.0–10.5)
nRBC: 0 % (ref 0.0–0.2)

## 2023-02-27 LAB — GLUCOSE, CAPILLARY
Glucose-Capillary: 101 mg/dL — ABNORMAL HIGH (ref 70–99)
Glucose-Capillary: 107 mg/dL — ABNORMAL HIGH (ref 70–99)
Glucose-Capillary: 113 mg/dL — ABNORMAL HIGH (ref 70–99)
Glucose-Capillary: 138 mg/dL — ABNORMAL HIGH (ref 70–99)
Glucose-Capillary: 183 mg/dL — ABNORMAL HIGH (ref 70–99)
Glucose-Capillary: 220 mg/dL — ABNORMAL HIGH (ref 70–99)

## 2023-02-27 LAB — HEMOGLOBIN A1C
Hgb A1c MFr Bld: 5.7 % — ABNORMAL HIGH (ref 4.8–5.6)
Mean Plasma Glucose: 116.89 mg/dL

## 2023-02-27 MED ORDER — SODIUM CHLORIDE 0.9% FLUSH
10.0000 mL | Freq: Two times a day (BID) | INTRAVENOUS | Status: DC
  Administered 2023-02-27 – 2023-03-02 (×7): 10 mL
  Administered 2023-03-02: 20 mL
  Administered 2023-03-03 – 2023-03-09 (×12): 10 mL

## 2023-02-27 MED ORDER — SUCRALFATE 1 GM/10ML PO SUSP
1.0000 g | Freq: Three times a day (TID) | ORAL | Status: DC
  Administered 2023-02-27 – 2023-03-09 (×33): 1 g via ORAL
  Filled 2023-02-27 (×34): qty 10

## 2023-02-27 MED ORDER — ONDANSETRON 4 MG PO TBDP
4.0000 mg | ORAL_TABLET | Freq: Once | ORAL | Status: AC
  Administered 2023-02-27: 4 mg via ORAL
  Filled 2023-02-27: qty 1

## 2023-02-27 MED ORDER — ONDANSETRON HCL 4 MG/2ML IJ SOLN
4.0000 mg | Freq: Once | INTRAMUSCULAR | Status: AC
  Administered 2023-02-27: 4 mg via INTRAVENOUS
  Filled 2023-02-27: qty 2

## 2023-02-27 MED ORDER — METOPROLOL TARTRATE 25 MG PO TABS
12.5000 mg | ORAL_TABLET | Freq: Two times a day (BID) | ORAL | Status: DC
  Administered 2023-02-27 – 2023-03-09 (×18): 12.5 mg via ORAL
  Filled 2023-02-27 (×19): qty 1

## 2023-02-27 MED ORDER — CHLORHEXIDINE GLUCONATE CLOTH 2 % EX PADS
6.0000 | MEDICATED_PAD | Freq: Every day | CUTANEOUS | Status: DC
  Administered 2023-02-27 – 2023-03-09 (×10): 6 via TOPICAL

## 2023-02-27 MED ORDER — PANTOPRAZOLE SODIUM 40 MG IV SOLR
40.0000 mg | Freq: Two times a day (BID) | INTRAVENOUS | Status: DC
  Administered 2023-02-27 – 2023-03-09 (×20): 40 mg via INTRAVENOUS
  Filled 2023-02-27 (×18): qty 10

## 2023-02-27 MED ORDER — SODIUM CHLORIDE 0.9% FLUSH: 10.0000 mL | INTRAVENOUS | Status: DC | PRN

## 2023-02-27 NOTE — Progress Notes (Signed)
Patient has had approx. 7-8 loose bloody stools within a 2 hr timeframe. Notified Dr. Thomes Dinning of this, new order to insert flexiseal.

## 2023-02-27 NOTE — Progress Notes (Signed)
PROGRESS NOTE     Heather [name], is a 72 y.o. female, DOB - 1951/09/24, ZOX:[id number]  Admit date - 02/26/2023   Admitting Physician Onnie Boer, MD  Outpatient Primary MD for the patient is [name], MD  LOS - 0  Chief Complaint  Patient presents with   Rectal Bleeding        Brief Narrative:  72 y.o. female with medical history significant for GI bleed, CKD 4, CVA, D CHF,  DM, HTN with history of peptic ulcer disease with recent bleeding from duodenal ulcer,, small bowel Crohn's disease admitted with small-volume bright red blood per rectum on 02/26/2023    -Assessment and Plan: 1)Acute GI bleed -Patient's hemoglobin was 3.2 on 02/17/2023--patient required transfusion of PRBC and endoscopy x 2 during that hospitalization -Discharge Hgb on 02/25/2023 was 9.6 -Hemoglobin currently around 9  S/p recent  EGD and flex sig 4/23 and repeat EGD 4/26 due to persistent bleeding Initial EGD from 02/19/2023 showed esophagitis with ulcers as well extensive duodenal ulceration on initial EGD.  Visible vessel treated with epinephrine and hemostasis clip.  She developed recurrent bleeding  - underwent a repeat EGD on 02/22/23 which revealed a visible vessel at a different site in the ulcer crater requiring epinephrine and clip placement.   -H. pylori negative - Limited FS on 02/22/2023 was with  poor prep nondiagnostic. -GI consult appreciated -If H&H remains stable may continue to monitor and treat empirically with IV Protonix and Carafate -If concerns about further bleeding with drop in H&H blood transfusion for repeating EGD and colonoscopy -No further BM at this time  2)Chronic diastolic CHF (congestive heart failure) (HCC) Stable and compensated.  Last echo 11/2022, EF of 60 to 65%, grade 1 DD.  Not on diuretic. -Monitor closely  3)Diabetes mellitus (HCC) -A1c 6.4 reflecting good diabetic control PTA -Hold Lantus until oral intake is more reliable Use Novolog/Humalog  Sliding scale insulin with Accu-Cheks/Fingersticks as ordered   4)HTN Stable, reduce metoprolol to 12.5 mg twice daily   5)Crohn's disease of small intestine without complication (HCC) Stable.  Previously treated with Humira currently not on medications for this -Recent biopsy with nondiagnostic  6) CKD stage -IV -Renal function currently close to baseline -Renally adjust medications, avoid nephrotoxic agents / dehydration  / hypotension  Status is: Inpatient   Disposition: The patient is from: Home              Anticipated d/c is to: Home              Anticipated d/c date is: 2 days              Patient currently is not medically stable to d/c. Barriers: Not Clinically Stable-   Code Status :  -  Code Status: Full Code   Family Communication:    NA (patient is alert, awake and coherent)   DVT Prophylaxis  :   - SCDs  SCDs Start: 02/26/23 2036   Lab Results  Component Value Date   PLT 155 02/27/2023    Inpatient Medications  Scheduled Meds:  allopurinol  100 mg Oral Daily   Chlorhexidine Gluconate Cloth  6 each Topical Daily   insulin aspart  0-15 Units Subcutaneous Q4H   levETIRAcetam  500 mg Oral BID   pantoprazole (PROTONIX) IV  40 mg Intravenous Q12H   sodium bicarbonate  650 mg Oral Daily   sodium chloride flush  10-40 mL Intracatheter Q12H   sucralfate  1 g Oral TID  WC & HS   Continuous Infusions: PRN Meds:.acetaminophen **OR** acetaminophen, polyethylene glycol, sodium chloride flush   Anti-infectives (From admission, onward)    Start     Dose/Rate Route Frequency Ordered Stop   02/26/23 2200  cefdinir (OMNICEF) capsule 300 mg  Status:  Discontinued        300 mg Oral 2 times daily 02/26/23 2038 02/26/23 2041       Subjective: Heather [name] today has no fevers, no emesis,  No chest pain,   - No further BM or bleeding per rectum -Patient is willing to try liquid diet   Objective: Vitals:   02/27/23 0434 02/27/23 0756 02/27/23 1408 02/27/23  1455  BP: 99/80 (!) 111/59 139/60   Pulse: 96 88 (!) 123 89  Resp: 16 14 16    Temp: (!) 97.5 F (36.4 C) 98.6 F (37 C) (!) 97.5 F (36.4 C)   TempSrc: Oral Oral Oral   SpO2: 100% 100% 99%   Weight:      Height:        Intake/Output Summary (Last 24 hours) at 02/27/2023 1702 Last data filed at 02/27/2023 1200 Gross per 24 hour  Intake 206.43 ml  Output --  Net 206.43 ml   Filed Weights   02/26/23 1434  Weight: 55.9 kg   Physical Exam Gen:- Awake Alert,  in no apparent distress  HEENT:- [place].AT, No sclera icterus Neck-Supple Neck,No JVD,.  Lungs-  CTAB , fair symmetrical air movement CV- S1, S2 normal, regular  Abd-  +ve B.Sounds, Abd Soft, No tenderness,    Extremity/Skin:- No  edema, pedal pulses present  Psych-affect is appropriate, oriented x3 Neuro-no new focal deficits, no tremors  Data Reviewed: I have personally reviewed following labs and imaging studies  CBC: Recent Labs  Lab 02/23/23 0434 02/24/23 0452 02/25/23 0946 02/26/23 1511 02/27/23 0509  WBC 5.4 7.6 9.2 8.5 8.1  NEUTROABS  --   --   --  7.1  --   HGB 11.2* 11.2* 9.6* 9.0* 9.0*  HCT 33.5* 33.6* 29.6* 27.3* 29.0*  MCV 91.8 91.6 96.7 94.8 99.7  PLT 155 191 196 171 155   Basic Metabolic Panel: Recent Labs  Lab 02/22/23 0423 02/23/23 0434 02/24/23 0452 02/25/23 0602 02/26/23 1511  NA 135 136 137 139 132*  K 4.0 3.6 3.9 3.6 3.7  CL 108 108 110 111 107  CO2 18* 19* 19* 17* 17*  GLUCOSE 118* 113* 79 208* 328*  BUN 92* 83* 82* 78* 69*  CREATININE 2.50* 2.36* 2.33* 2.43* 2.22*  CALCIUM 7.6* 8.0* 8.1* 8.0* 7.9*   GFR: Estimated Creatinine Clearance: 20.4 mL/min (A) (by C-G formula based on SCr of 2.22 mg/dL (H)). Liver Function Tests: Recent Labs  Lab 02/26/23 1511  AST 14*  ALT 9  ALKPHOS 78  BILITOT 0.7  PROT 4.3*  ALBUMIN 1.8*   HbA1C: Recent Labs    02/26/23 1511  HGBA1C 5.7*   Recent Results (from the past 240 hour(s))  MRSA Next Gen by PCR, Nasal     Status: Abnormal    Collection Time: 02/17/23  7:35 PM   Specimen: Nasal Mucosa; Nasal Swab  Result Value Ref Range Status   MRSA by PCR Next Gen DETECTED (A) NOT DETECTED Final    Comment: RESULT CALLED TO, READ BACK BY AND VERIFIED WITH: KING J @ 0747 ON [id number] BY HENDERSON L (NOTE) The GeneXpert MRSA Assay (FDA approved for NASAL specimens only), is one component of a comprehensive MRSA colonization surveillance program. It  is not intended to diagnose MRSA infection nor to guide or monitor treatment for MRSA infections. Test performance is not FDA approved in patients less than 71 years old. Performed at [facility], [address], [place], [place] [address]   Urine Culture (for pregnant, neutropenic or urologic patients or patients with an indwelling urinary catheter)     Status: Abnormal   Collection Time: 02/20/23  3:20 PM   Specimen: Urine, Clean Catch  Result Value Ref Range Status   Specimen Description   Final    URINE, CLEAN CATCH Performed at [facility], [address], [place], [place] [address]    Special Requests   Final    NONE Performed at [facility], [address] [place], [place] [address]    Culture (A)  Final    <10,000 COLONIES/mL INSIGNIFICANT GROWTH Performed at [facility], [address] [place], [place] [address]    Report Status 02/21/2023 FINAL  Final    Radiology Studies: [place] EKG SITE RITE  Result Date: 02/27/2023 If Site Rite image not attached, placement could not be confirmed due to current cardiac rhythm.   Scheduled Meds:  allopurinol  100 mg Oral Daily   Chlorhexidine Gluconate Cloth  6 each Topical Daily   insulin aspart  0-15 Units Subcutaneous Q4H   levETIRAcetam  500 mg Oral BID   pantoprazole (PROTONIX) IV  40 mg Intravenous Q12H   sodium bicarbonate  650 mg Oral Daily   sodium chloride flush  10-40 mL Intracatheter Q12H   sucralfate  1 g Oral TID WC & HS   Continuous Infusions:   LOS: 0 days   Shon Hale M.D on 02/27/2023 at 5:02  PM  Go to [email or web address] - for contact info  [facility] - Office  [phone]  If 7PM-7AM, please contact night-coverage [email or web address] 02/27/2023, 5:02 PM

## 2023-02-27 NOTE — Care Management Obs Status (Signed)
MEDICARE OBSERVATION STATUS NOTIFICATION   Patient Details  Name: Heather [name] MRN: [id number] Date of Birth: 03-16-1951   Medicare Observation Status Notification Given:  Yes    Corey Harold 02/27/2023, 11:27 AM

## 2023-02-27 NOTE — Progress Notes (Signed)
   02/27/23 1408  Assess: MEWS Score  Temp (!) 97.5 F (36.4 C)  BP 139/60  MAP (mmHg) 76  Pulse Rate (!) 123 (Nurse notified)  Resp 16  SpO2 99 %  O2 Device Room Air  Patient Activity (if Appropriate) In bed  Assess: MEWS Score  MEWS Temp 0  MEWS Systolic 0  MEWS Pulse 2  MEWS RR 0  MEWS LOC 0  MEWS Score 2  MEWS Score Color Yellow  Assess: if the MEWS score is Yellow or Red  Were vital signs taken at a resting state? Yes  Focused Assessment No change from prior assessment  Does the patient meet 2 or more of the SIRS criteria? No  MEWS guidelines implemented  No, vital signs rechecked  Notify: Charge Nurse/RN  Name of Charge Nurse/RN Notified Mary H. CN  Assess: SIRS CRITERIA  SIRS Temperature  0  SIRS Pulse 1  SIRS Respirations  0  SIRS WBC 0  SIRS Score Sum  1

## 2023-02-27 NOTE — H&P (View-Only) (Signed)
 Gastroenterology Consult   Referring Provider: No ref. provider found Primary Care Physician:  Shah, Ashish, MD Primary Gastroenterologist:  Dr. Gessner   Patient ID: Heather [name]; [id number]; 06/01/1951   Admit date: 02/26/2023  LOS: 0 days   Date of Consultation: 02/27/2023  Reason for Consultation:  rectal bleeding, anemia   History of Present Illness   Heather [name] is a 71 y.o. year old female with a history of small bowel Crohn's disease diagnosed in Jan 2022 and previously on Humira, history of pancreatitis possibly related to hypertriglyceridemia and use of Victoza, CKD, depression, diabetes, GERD, anxiety, IBS, hyperlipidemia, stroke who presented with rectal bleeding with dark red blood, reportedly small volume and associated dizziness upon sitting/standing. No further rectal bleeding since arrival to ED. Notably with Recent discharge on 4/29, admission at that time for profound anemia, proctocolitis, Acute renal failure, metabolic encephalopathy, EGD at that time with esophageal ulcers and esophagitis, oozing duodenal ulcers and clots s/p injection/clips. Repeat EGD in 4/26 with visible vessel from ulcer bases s/p epi and clip placement.   ED Course: BP 140s systolic HR 96-104, Hgb 9 (9.6 on discharge)  Consult:  She was discharged on nexium 40mg BID and maalox q8h. Patient reports 2 episodes of rectal bleeding that began yesterday. she notes blood was Bright red in color, she just smeared on the stool. stools were solid, no straining. No further rectal bleeding since admission. She notes some mid abdominal pain just above umbilicus that began when she got home, pain similar to previous pain she experienced. She denies any NSAID use.  She endorses nausea that began yesterday. No vomiting. Reports appetite has not been good, eating makes her feel sick. She denies SOB or dizziness.    Last EGD: as above Last Colonoscopy: May 2021 : Examined portion of ileum normal.  Examined colon normal. Colon biopsies negative.  Flex sig: - Preparation of the colon was poor.  - Stool in the rectum, in the recto-sigmoid colon and in the sigmoid colon. - No specimens collected.   Past Medical History:  Diagnosis Date   Acute renal failure superimposed on stage 3 chronic kidney disease (HCC) 01/21/2016   Anemia    Anxiety    B12 deficiency    Brain tumor (benign) (HCC) 10/29/2020   Cellulitis and abscess    Abdomen and buttocks   Chronic diastolic CHF (congestive heart failure) (HCC) 03/16/2022   CKD (chronic kidney disease) stage 3, GFR 30-59 ml/min (HCC)    Closed fracture of distal end of femur, unspecified fracture morphology, initial encounter (HCC)    Coronary arteriosclerosis 07/15/2018   Crohn disease (HCC)    Depression    Diabetic neuropathy (HCC)    feet   DJD (degenerative joint disease)    Right forminal stenosis C4-5   Essential hypertension    Femur fracture, right (HCC) 03/30/2017   Folliculitis    GERD (gastroesophageal reflux disease)    Gout    Headache(784.0)    Hiatal hernia    History of blood transfusion    History of bronchitis    History of cardiac catheterization    No significant CAD 2012   History of kidney stones    surgery to remove   Insomnia    Irritable bowel syndrome    Mixed hyperlipidemia    Nausea & vomiting 12/22/2014   Osteoarthritis    Palpitations    Pneumonia    Reflux esophagitis    Salmonella    Stroke (HCC)      Tinnitus    Right   Type 2 diabetes mellitus (HCC)     Past Surgical History:  Procedure Laterality Date   BACK SURGERY     BIOPSY  02/19/2023   Procedure: BIOPSY;  Surgeon: Castaneda Mayorga, Daniel, MD;  [facility]: [facility];  Service: Gastroenterology;;   c-spine surgery     03/2007   CARDIAC CATHETERIZATION     CHOLECYSTECTOMY     COLONOSCOPY  04/21/2011; 05/29/11   6/12: [facility] - ?AVM at IC valve, inflammatory changes at IC valve ([facility]); 7/12 - [facility]ner; IC valve  erosions, look chronic and probably Crohn's per path   colonoscopy  2017   [place]: random biopsies normal. TI normal   ESOPHAGOGASTRODUODENOSCOPY  05/29/11   normal   ESOPHAGOGASTRODUODENOSCOPY N/A 01/21/2016   Dr. Schooler: normal   ESOPHAGOGASTRODUODENOSCOPY (EGD) WITH PROPOFOL N/A 02/19/2023   Procedure: ESOPHAGOGASTRODUODENOSCOPY (EGD) WITH PROPOFOL;  Surgeon: Castaneda Mayorga, Daniel, MD;  [facility]: [facility];  Service: Gastroenterology;  Laterality: N/A;   EYE SURGERY Bilateral    cataracts removed   FEMUR IM NAIL Right 04/10/2018   Procedure: INTRAMEDULLARY (IM) NAIL FEMORAL;  Surgeon: Olin, Matthew, MD;  Location: [facility];  Service: Orthopedics;  Laterality: Right;   FLEXIBLE SIGMOIDOSCOPY N/A 02/19/2023   Procedure: FLEXIBLE SIGMOIDOSCOPY;  Surgeon: Castaneda Mayorga, Daniel, MD;  [facility]: [facility];  Service: Gastroenterology;  Laterality: N/A;   FRACTURE SURGERY     GIVENS CAPSULE STUDY  11/17/2020   HARDWARE REMOVAL Right 04/10/2018   Procedure: HARDWARE REMOVAL RIGHT DISTAL FEMUR;  Surgeon: Olin, Matthew, MD;  Location: [facility];  Service: Orthopedics;  Laterality: Right;   HEMOSTASIS CLIP PLACEMENT  02/19/2023   Procedure: HEMOSTASIS CLIP PLACEMENT;  Surgeon: Castaneda Mayorga, Daniel, MD;  [facility];  Service: Gastroenterology;;   HEMOSTASIS CONTROL  02/19/2023   Procedure: HEMOSTASIS CONTROL;  Surgeon: Castaneda Mayorga, Daniel, MD;  [facility];  Service: Gastroenterology;;   HERNIA REPAIR     IR FLUORO GUIDE CV LINE RIGHT  04/01/2017   IR US GUIDE VASC ACCESS RIGHT  04/01/2017   JOINT REPLACEMENT Right    hip   OPEN REDUCTION INTERNAL FIXATION (ORIF) DISTAL RADIAL FRACTURE Right 05/28/2019   Procedure: OPEN REDUCTION INTERNAL FIXATION (ORIF) DISTAL RADIAL FRACTURE;  Surgeon: Creighton, James J III, MD;  Location: [facility];  Service: Orthopedics;  Laterality: Right;  with block 90 minutes   ORIF FEMUR FRACTURE Right 03/31/2017   Procedure: OPEN  REDUCTION INTERNAL FIXATION (ORIF) DISTAL FEMUR FRACTURE;  Surgeon: Olin, Matthew, MD;  Location: [facility];  Service: Orthopedics;  Laterality: Right;   ORIF FEMUR FRACTURE Right 12/14/2022   Procedure: OPEN REDUCTION INTERNAL FIXATION (ORIF) DISTAL FEMUR FRACTURE;  Surgeon: Haddix, Kevin P, MD;  Location: [facility];  Service: Orthopedics;  Laterality: Right;   removal of kidney stone     Right knee arthroscopic surgery     TONSILLECTOMY     TOTAL ABDOMINAL HYSTERECTOMY      Prior to Admission medications   Medication Sig Start Date End Date Taking? Authorizing Provider  allopurinol (ZYLOPRIM) 100 MG tablet Take 100 mg by mouth daily. 03/20/21  Yes [provider]  atorvastatin (LIPITOR) 80 MG tablet Take 1 tablet (80 mg total) by mouth daily. Restart on 06/29/22 Patient taking differently: Take 80 mg by mouth at bedtime. Restart on 06/29/22 06/22/22  Yes Tat, David, MD  cefdinir (OMNICEF) 300 MG capsule Take 1 capsule (300 mg total) by mouth 2 (two) times daily for   5 days. 02/25/23 03/02/23 Yes Shahmehdi, Seyed A, MD  esomeprazole (NEXIUM) 40 MG capsule Take 1 capsule (40 mg total) by mouth 2 (two) times daily before a meal. 02/25/23 03/27/23 Yes Shahmehdi, Seyed A, MD  HYDROcodone-acetaminophen (NORCO/VICODIN) 5-325 MG tablet Take 1 tablet by mouth every 6 (six) hours as needed.   Yes [provider]  insulin aspart (NOVOLOG) 100 UNIT/ML FlexPen Inject 2 Units into the skin 3 (three) times daily with meals. Give only if eats 50% or more of meal. Patient taking differently: Inject 2-5 Units into the skin 3 (three) times daily with meals. Give only if eats 50% or more of meal. 02/11/23  Yes Johnson, Clanford L, MD  insulin glargine-yfgn (SEMGLEE) 100 UNIT/ML Pen Inject 7 Units into the skin daily. Patient taking differently: Inject 15 Units into the skin daily. 02/11/23  Yes Johnson, Clanford L, MD  lactobacillus acidophilus & bulgar (LACTINEX) chewable tablet Chew 1 tablet by mouth 3 (three) times  daily with meals for 10 days. 02/25/23 03/07/23 Yes Shahmehdi, Seyed A, MD  levETIRAcetam (KEPPRA) 500 MG tablet Take 500 mg by mouth 2 (two) times daily.   Yes [provider]  metoprolol succinate (TOPROL-XL) 25 MG 24 hr tablet Take 1.5 tablets (37.5 mg total) by mouth daily. 02/11/23  Yes Johnson, Clanford L, MD  Nutritional Supplements (,FEEDING SUPPLEMENT, PROSOURCE PLUS) liquid Take 30 mLs by mouth 3 (three) times daily between meals. 02/25/23 03/27/23 Yes Shahmehdi, Seyed A, MD  ondansetron (ZOFRAN-ODT) 4 MG disintegrating tablet Take 4 mg by mouth every 6 (six) hours as needed. 01/21/23  Yes [provider]  sodium bicarbonate 650 MG tablet Take 1 tablet (650 mg total) by mouth daily. 02/11/23  Yes Johnson, Clanford L, MD  venlafaxine XR (EFFEXOR-XR) 150 MG 24 hr capsule Take 150 mg by mouth daily. 07/11/22  Yes [provider]  alum & mag hydroxide-simeth (MAALOX MAX) 400-400-40 MG/5ML suspension Take 10 mLs by mouth every 8 (eight) hours for 12 days. 02/25/23 03/09/23  Shahmehdi, Seyed A, MD    Current Facility-Administered Medications  Medication Dose Route Frequency Provider Last Rate Last Admin   acetaminophen (TYLENOL) tablet 650 mg  650 mg Oral Q6H PRN Emokpae, Ejiroghene E, MD   650 mg at 02/27/23 0929   Or   acetaminophen (TYLENOL) suppository 650 mg  650 mg Rectal Q6H PRN Emokpae, Ejiroghene E, MD       allopurinol (ZYLOPRIM) tablet 100 mg  100 mg Oral Daily Emokpae, Ejiroghene E, MD   100 mg at 02/27/23 0928   insulin aspart (novoLOG) injection 0-15 Units  0-15 Units Subcutaneous Q4H Emokpae, Ejiroghene E, MD   5 Units at 02/27/23 0600   levETIRAcetam (KEPPRA) tablet 500 mg  500 mg Oral BID Emokpae, Ejiroghene E, MD   500 mg at 02/27/23 0929   pantoprazole (PROTONIX) injection 40 mg  40 mg Intravenous Q24H Emokpae, Ejiroghene E, MD       polyethylene glycol (MIRALAX / GLYCOLAX) packet 17 g  17 g Oral Daily PRN Emokpae, Ejiroghene E, MD       sodium bicarbonate  tablet 650 mg  650 mg Oral Daily Emokpae, Ejiroghene E, MD   650 mg at 02/27/23 0929    Allergies as of 02/26/2023 - Review Complete 02/26/2023  Allergen Reaction Noted   Cozaar [losartan] Nausea And Vomiting, Swelling, and Rash 02/23/2015   Quinolones Itching, Nausea And Vomiting, Nausea Only, and Swelling 05/13/2012   Cymbalta [duloxetine hcl] Itching and Swelling 01/23/2016   Sulfa   antibiotics Nausea And Vomiting and Swelling 05/13/2012   Synalar [fluocinolone] Swelling 01/23/2016   Cipro [ciprofloxacin hcl] Nausea And Vomiting and Swelling 03/16/2022   Diovan [valsartan] Nausea And Vomiting and Swelling 12/22/2014   Glucophage [metformin] Other (See Comments)    Ketalar [ketamine] Swelling 11/28/2021   Norco [hydrocodone-acetaminophen] Itching and Swelling 11/28/2021   Norvasc [amlodipine besylate] Itching and Swelling 11/28/2021   Nsaids Nausea And Vomiting and Other (See Comments) 08/29/2016   Tape Itching and Rash 06/15/2018   Zestril [lisinopril] Swelling and Rash     Family History  Problem Relation Age of Onset   Diabetes Mother    Colon cancer Brother        POSSIBLY. Patient states diagnosed at age 51, then later states age 89. unclear and limited historian   Esophageal cancer Neg Hx    Rectal cancer Neg Hx    Stomach cancer Neg Hx     Social History   Socioeconomic History   Marital status: Married    Spouse name: Not on file   Number of children: 2   Years of education: Not on file   Highest education level: Not on file  Occupational History   Occupation: Disabled  Tobacco Use   Smoking status: Never   Smokeless tobacco: Never  Vaping Use   Vaping Use: Never used  Substance and Sexual Activity   Alcohol use: No   Drug use: No   Sexual activity: Not on file    Comment: Hysterectomy  Other Topics Concern   Not on file  Social History Narrative   Patient is married 2 children she is disabled, 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    Social Determinants of Health   Financial Resource Strain: Not on file  Food Insecurity: No Food Insecurity (02/17/2023)   Hunger Vital Sign    Worried About Running Out of Food in the Last Year: Never true    Ran Out of Food in the Last Year: Never true  Transportation Needs: No Transportation Needs (02/17/2023)   PRAPARE - Transportation    Lack of Transportation (Medical): No    Lack of Transportation (Non-Medical): No  Physical Activity: Not on file  Stress: Not on file  Social Connections: Not on file  Intimate Partner Violence: Not At Risk (02/17/2023)   Humiliation, Afraid, Rape, and Kick questionnaire    Fear of Current or Ex-Partner: No    Emotionally Abused: No    Physically Abused: No    Sexually Abused: No    Review of Systems   Gen: Denies any fever, chills, loss of appetite, change in weight or weight loss CV: Denies chest pain, heart palpitations, syncope, edema  Resp: Denies shortness of breath with rest, cough, wheezing, coughing up blood, and pleurisy. GI:  denies melena,  vomiting, diarrhea, constipation, dysphagia, odyonophagia, early satiety or weight loss. +rectal bleeding +nausea +mid/upper abdominal pain GU : Denies urinary burning, blood in urine, urinary frequency, and urinary incontinence. MS: Denies joint pain, limitation of movement, swelling, cramps, and atrophy.  Derm: Denies rash, itching, dry skin, hives. Psych: Denies depression, anxiety, memory loss, hallucinations, and confusion. Heme: Denies bruising or bleeding Neuro:  Denies any headaches, dizziness, paresthesias, shaking  Physical Exam   Vital Signs in   last 24 hours: Temp:  [97.5 F (36.4 C)-98.6 F (37 C)] 98.6 F (37 C) (05/01 0756) Pulse Rate:  [83-104] 88 (05/01 0756) Resp:  [14-18] 14 (05/01 0756) BP:  (99-149)/(53-80) 111/59 (05/01 0756) SpO2:  [97 %-100 %] 100 % (05/01 0756) Weight:  [55.9 kg] 55.9 kg (04/30 1434) Last BM Date : 02/26/23  General:   Alert,  Well-developed, well-nourished, pleasant and cooperative in NAD Head:  Normocephalic and atraumatic. Eyes:  Sclera clear, no icterus.   Conjunctiva pink. Ears:  Normal auditory acuity. Mouth:  No deformity or lesions, dentition normal. Lungs:  Clear throughout to auscultation.   No wheezes, crackles, or rhonchi. No acute distress. Heart:  Regular rate and rhythm; no murmurs, clicks, rubs,  or gallops. Abdomen:  Soft, nontender and nondistended. No masses, hepatosplenomegaly or hernias noted. Normal bowel sounds, without guarding, and without rebound.   Msk:  Symmetrical without gross deformities. Normal posture. Extremities:  Without clubbing or edema. Neurologic:  Alert and  oriented x4. Skin:  Intact without significant lesions or rashes. Psych:  Alert and cooperative. Normal mood and affect.  Labs/Studies   Recent Labs Recent Labs    02/25/23 0946 02/26/23 1511 02/27/23 0509  WBC 9.2 8.5 8.1  HGB 9.6* 9.0* 9.0*  HCT 29.6* 27.3* 29.0*  PLT 196 171 155   BMET Recent Labs    02/25/23 0602 02/26/23 1511  NA 139 132*  K 3.6 3.7  CL 111 107  CO2 17* 17*  GLUCOSE 208* 328*  BUN 78* 69*  CREATININE 2.43* 2.22*  CALCIUM 8.0* 7.9*   LFT Recent Labs    02/26/23 1511  PROT 4.3*  ALBUMIN 1.8*  AST 14*  ALT 9  ALKPHOS 78  BILITOT 0.7    Assessment   Heather [name] is a 71 y.o. year old female with a history of small bowel Crohn's disease diagnosed in Jan 2022 and previously on Humira, history of pancreatitis possibly related to hypertriglyceridemia and use of Victoza, CKD, depression, diabetes, GERD, anxiety, IBS, hyperlipidemia, stroke who presented with rectal bleeding with dark red blood, reportedly small volume and associated dizziness upon sitting/standing. Recent admission with EGD showing esophageal  and duodenal ulcers treated with epi/clips. GI consulted for recurrent rectal bleeding  Rectal bleeding/Anemia: hgb 9, down from 9.6 on 4/29. 2 episodes of reported bright red blood in stool yesterday. No further rectal bleeding since prior to admission. Esophageal ulcers and duodenal ulcers noted on recent EGDs in setting of NSAID use. She is having some nausea and endorses some lower abdominal pain. As hgb has remained relatively stable and no further rectal bleeding, would recommend PPI BID, carafate 1g QID, trend h&h, monitor for overt GI bleeding, if hgb continues to trend down or bleed recurs, consider repeat EGD.   Plan / Recommendations   PPI BID Trend h&h Monitor for overt GI bleeding Avoid all NSAIDs  Hold off on further endoscopic evaluation unless rectal bleeding persists/significant drop in hgb 6. Carafate 1g QID     02/27/2023, 9:39 AM  Heather Lawley L. Gracilyn Gunia, MSN, APRN, AGNP-C Adult-Gerontology Nurse Practitioner [facility] [place]  

## 2023-02-27 NOTE — Progress Notes (Signed)
Patient banging on bedrail with call bell yelling out for staff to get her up, per dayshift nurse she was just put back in bed and put on and off bed pan. Talking with patient she states to get her off the bed pan and that her bottom hurt. Checked and advised that she was not on the bed pan, foam noted to sacrum d/t pressure sore. Tylenol given for pain and metoprolol for elevated HR of 115 that was ordered as a one time order. Will continue to monitor.

## 2023-02-27 NOTE — TOC Progression Note (Addendum)
  Transition of Care [facility]) Screening Note   Patient Details  Name: Heather [name] Date of Birth: 01-30-1951   Transition of Care [facility]) CM/SW Contact:    Elliot Gault, LCSW Phone Number: 02/27/2023, 9:20 AM    Pt recently discharged from [facility] with [facility] RN/PT/OT/Aide/SW. Will arrange for resumption of care at dc.  Transition of Care Department [facility]) has reviewed patient and no TOC needs have been identified at this time. We will continue to monitor patient advancement through interdisciplinary progression rounds. If new patient transition needs arise, please place a TOC consult.

## 2023-02-27 NOTE — Progress Notes (Signed)
Peripherally Inserted Central Catheter Placement  The IV Nurse has discussed with the patient and/or persons authorized to consent for the patient, the purpose of this procedure and the potential benefits and risks involved with this procedure.  The benefits include less needle sticks, lab draws from the catheter, and the patient may be discharged home with the catheter. Risks include, but not limited to, infection, bleeding, blood clot (thrombus formation), and puncture of an artery; nerve damage and irregular heartbeat and possibility to perform a PICC exchange if needed/ordered by physician.  Alternatives to this procedure were also discussed.  Bard Power PICC patient education guide, fact sheet on infection prevention and patient information card has been provided to patient /or left at bedside.    PICC Placement Documentation  PICC Double Lumen 02/27/23 Left Brachial 42 cm 0 cm (Active)  Indication for Insertion or Continuance of Line Poor Vasculature-patient has had multiple peripheral attempts or PIVs lasting less than 24 hours 02/27/23 1351  Exposed Catheter (cm) 0 cm 02/27/23 1351  Site Assessment Clean, Dry, Intact 02/27/23 1351  Lumen #1 Status Flushed;Blood return noted;Saline locked 02/27/23 1351  Lumen #2 Status Flushed;Saline locked;Blood return noted 02/27/23 1351  Dressing Type Transparent;Securing device 02/27/23 1351  Dressing Status Antimicrobial disc in place 02/27/23 1351  Safety Lock Not Applicable 02/27/23 1351  Line Adjustment (NICU/IV Team Only) No 02/27/23 1351  Dressing Change Due 03/06/23 02/27/23 1351       Romie Jumper 02/27/2023, 1:54 PM

## 2023-02-27 NOTE — Consult Note (Signed)
Gastroenterology Consult   Referring Provider: No ref. provider found Primary Care Physician:  [name], MD Primary Gastroenterologist:  Dr. [name] Payor   Patient ID: Heather [name]; [id number]; 01-Jun-1951   Admit date: 02/26/2023  LOS: 0 days   Date of Consultation: 02/27/2023  Reason for Consultation:  rectal bleeding, anemia   History of Present Illness   Heather [name] is a 72 y.o. year old female with a history of small bowel Crohn's disease diagnosed in Jan 2022 and previously on Humira, history of pancreatitis possibly related to hypertriglyceridemia and use of Victoza, CKD, depression, diabetes, GERD, anxiety, IBS, hyperlipidemia, stroke who presented with rectal bleeding with dark red blood, reportedly small volume and associated dizziness upon sitting/standing. No further rectal bleeding since arrival to ED. Notably with Recent discharge on 4/29, admission at that time for profound anemia, proctocolitis, Acute renal failure, metabolic encephalopathy, EGD at that time with esophageal ulcers and esophagitis, oozing duodenal ulcers and clots s/p injection/clips. Repeat EGD in 4/26 with visible vessel from ulcer bases s/p epi and clip placement.   ED Course: BP 140s systolic HR 96-104, Hgb 9 (9.6 on discharge)  Consult:  She was discharged on nexium 40mg  BID and maalox q8h. Patient reports 2 episodes of rectal bleeding that began yesterday. she notes blood was Bright red in color, she just smeared on the stool. stools were solid, no straining. No further rectal bleeding since admission. She notes some mid abdominal pain just above umbilicus that began when she got home, pain similar to previous pain she experienced. She denies any NSAID use.  She endorses nausea that began yesterday. No vomiting. Reports appetite has not been good, eating makes her feel sick. She denies SOB or dizziness.    Last EGD: as above Last Colonoscopy: May 2021 : Examined portion of ileum normal.  Examined colon normal. Colon biopsies negative.  Flex sig: - Preparation of the colon was poor.  - Stool in the rectum, in the recto-sigmoid colon and in the sigmoid colon. - No specimens collected.   Past Medical History:  Diagnosis Date   Acute renal failure superimposed on stage 3 chronic kidney disease (HCC) 01/21/2016   Anemia    Anxiety    B12 deficiency    Brain tumor (benign) (HCC) 10/29/2020   Cellulitis and abscess    Abdomen and buttocks   Chronic diastolic CHF (congestive heart failure) (HCC) 03/16/2022   CKD (chronic kidney disease) stage 3, GFR 30-59 ml/min (HCC)    Closed fracture of distal end of femur, unspecified fracture morphology, initial encounter (HCC)    Coronary arteriosclerosis 07/15/2018   Crohn disease (HCC)    Depression    Diabetic neuropathy (HCC)    feet   DJD (degenerative joint disease)    Right forminal stenosis C4-5   Essential hypertension    Femur fracture, right (HCC) 03/30/2017   Folliculitis    GERD (gastroesophageal reflux disease)    Gout    Headache(784.0)    Hiatal hernia    History of blood transfusion    History of bronchitis    History of cardiac catheterization    No significant CAD 2012   History of kidney stones    surgery to remove   Insomnia    Irritable bowel syndrome    Mixed hyperlipidemia    Nausea & vomiting 12/22/2014   Osteoarthritis    Palpitations    Pneumonia    Reflux esophagitis    Salmonella    Stroke (HCC)  Tinnitus    Right   Type 2 diabetes mellitus [facility])     Past Surgical History:  Procedure Laterality Date   BACK SURGERY     BIOPSY  02/19/2023   Procedure: BIOPSY;  Surgeon: Dolores Frame, MD;  [facility]: [facility];  Service: Gastroenterology;;   c-spine surgery     03/2007   CARDIAC CATHETERIZATION     CHOLECYSTECTOMY     COLONOSCOPY  04/21/2011; 05/29/11   6/12: [facility] - ?AVM at IC valve, inflammatory changes at IC valve [facility]); 7/12 - [facility]; IC valve  erosions, look chronic and probably Crohn's per path   colonoscopy  2017   [facility]: random biopsies normal. TI normal   ESOPHAGOGASTRODUODENOSCOPY  05/29/11   normal   ESOPHAGOGASTRODUODENOSCOPY N/A 01/21/2016   Dr. Bosie Clos: normal   ESOPHAGOGASTRODUODENOSCOPY (EGD) WITH PROPOFOL N/A 02/19/2023   Procedure: ESOPHAGOGASTRODUODENOSCOPY (EGD) WITH PROPOFOL;  Surgeon: Dolores Frame, MD;  Location: [facility];  Service: Gastroenterology;  Laterality: N/A;   EYE SURGERY Bilateral    cataracts removed   FEMUR IM NAIL Right 04/10/2018   Procedure: INTRAMEDULLARY (IM) NAIL FEMORAL;  Surgeon: Durene Romans, MD;  Location: [facility];  Service: Orthopedics;  Laterality: Right;   FLEXIBLE SIGMOIDOSCOPY N/A 02/19/2023   Procedure: FLEXIBLE SIGMOIDOSCOPY;  Surgeon: Dolores Frame, MD;  [facility]: [facility];  Service: Gastroenterology;  Laterality: N/A;   FRACTURE SURGERY     GIVENS CAPSULE STUDY  11/17/2020   HARDWARE REMOVAL Right 04/10/2018   Procedure: HARDWARE REMOVAL RIGHT DISTAL FEMUR;  Surgeon: Durene Romans, MD;  Location: [facility];  Service: Orthopedics;  Laterality: Right;   HEMOSTASIS CLIP PLACEMENT  02/19/2023   Procedure: HEMOSTASIS CLIP PLACEMENT;  Surgeon: Dolores Frame, MD;  [facility];  Service: Gastroenterology;;   HEMOSTASIS CONTROL  02/19/2023   Procedure: HEMOSTASIS CONTROL;  Surgeon: Dolores Frame, MD;  [facility];  Service: Gastroenterology;;   HERNIA REPAIR     IR FLUORO GUIDE CV LINE RIGHT  04/01/2017   IR US GUIDE VASC ACCESS RIGHT  04/01/2017   JOINT REPLACEMENT Right    hip   OPEN REDUCTION INTERNAL FIXATION (ORIF) DISTAL RADIAL FRACTURE Right 05/28/2019   Procedure: OPEN REDUCTION INTERNAL FIXATION (ORIF) DISTAL RADIAL FRACTURE;  Surgeon: Ernest Mallick, MD;  Location: [facility] OR;  Service: Orthopedics;  Laterality: Right;  with block 90 minutes   ORIF FEMUR FRACTURE Right 03/31/2017   Procedure: OPEN  REDUCTION INTERNAL FIXATION (ORIF) DISTAL FEMUR FRACTURE;  Surgeon: Durene Romans, MD;  Location: [facility] OR;  Service: Orthopedics;  Laterality: Right;   ORIF FEMUR FRACTURE Right 12/14/2022   Procedure: OPEN REDUCTION INTERNAL FIXATION (ORIF) DISTAL FEMUR FRACTURE;  Surgeon: Roby Lofts, MD;  Location: [facility] OR;  Service: Orthopedics;  Laterality: Right;   removal of kidney stone     Right knee arthroscopic surgery     TONSILLECTOMY     TOTAL ABDOMINAL HYSTERECTOMY      Prior to Admission medications   Medication Sig Start Date End Date Taking? Authorizing Provider  allopurinol (ZYLOPRIM) 100 MG tablet Take 100 mg by mouth daily. 03/20/21  Yes [provider]  atorvastatin (LIPITOR) 80 MG tablet Take 1 tablet (80 mg total) by mouth daily. Restart on 06/29/22 Patient taking differently: Take 80 mg by mouth at bedtime. Restart on 06/29/22 06/22/22  Yes Tat, Onalee Hua, MD  cefdinir (OMNICEF) 300 MG capsule Take 1 capsule (300 mg total) by mouth 2 (two) times daily for  5 days. 02/25/23 03/02/23 Yes Shahmehdi, Gemma Payor, MD  esomeprazole (NEXIUM) 40 MG capsule Take 1 capsule (40 mg total) by mouth 2 (two) times daily before a meal. 02/25/23 03/27/23 Yes Shahmehdi, Seyed A, MD  HYDROcodone-acetaminophen (NORCO/VICODIN) 5-325 MG tablet Take 1 tablet by mouth every 6 (six) hours as needed.   Yes [provider]  insulin aspart (NOVOLOG) 100 UNIT/ML FlexPen Inject 2 Units into the skin 3 (three) times daily with meals. Give only if eats 50% or more of meal. Patient taking differently: Inject 2-5 Units into the skin 3 (three) times daily with meals. Give only if eats 50% or more of meal. 02/11/23  Yes Johnson, Clanford L, MD  insulin glargine-yfgn (SEMGLEE) 100 UNIT/ML Pen Inject 7 Units into the skin daily. Patient taking differently: Inject 15 Units into the skin daily. 02/11/23  Yes Johnson, Clanford L, MD  lactobacillus acidophilus & bulgar (LACTINEX) chewable tablet Chew 1 tablet by mouth 3 (three) times  daily with meals for 10 days. 02/25/23 03/07/23 Yes Shahmehdi, Gemma Payor, MD  levETIRAcetam (KEPPRA) 500 MG tablet Take 500 mg by mouth 2 (two) times daily.   Yes [provider]  metoprolol succinate (TOPROL-XL) 25 MG 24 hr tablet Take 1.5 tablets (37.5 mg total) by mouth daily. 02/11/23  Yes Johnson, Clanford L, MD  Nutritional Supplements (,FEEDING SUPPLEMENT, PROSOURCE PLUS) liquid Take 30 mLs by mouth 3 (three) times daily between meals. 02/25/23 03/27/23 Yes Shahmehdi, Seyed A, MD  ondansetron (ZOFRAN-ODT) 4 MG disintegrating tablet Take 4 mg by mouth every 6 (six) hours as needed. 01/21/23  Yes [provider]  sodium bicarbonate 650 MG tablet Take 1 tablet (650 mg total) by mouth daily. 02/11/23  Yes Johnson, Clanford L, MD  venlafaxine XR (EFFEXOR-XR) 150 MG 24 hr capsule Take 150 mg by mouth daily. 07/11/22  Yes [provider]  alum & mag hydroxide-simeth (MAALOX MAX) 400-400-40 MG/5ML suspension Take 10 mLs by mouth every 8 (eight) hours for 12 days. 02/25/23 03/09/23  Kendell Bane, MD    Current Facility-Administered Medications  Medication Dose Route Frequency Provider Last Rate Last Admin   acetaminophen (TYLENOL) tablet 650 mg  650 mg Oral Q6H PRN Emokpae, Ejiroghene E, MD   650 mg at 02/27/23 3244   Or   acetaminophen (TYLENOL) suppository 650 mg  650 mg Rectal Q6H PRN Emokpae, Ejiroghene E, MD       allopurinol (ZYLOPRIM) tablet 100 mg  100 mg Oral Daily Emokpae, Ejiroghene E, MD   100 mg at 02/27/23 0928   insulin aspart (novoLOG) injection 0-15 Units  0-15 Units Subcutaneous Q4H Emokpae, Ejiroghene E, MD   5 Units at 02/27/23 0600   levETIRAcetam (KEPPRA) tablet 500 mg  500 mg Oral BID Emokpae, Ejiroghene E, MD   500 mg at 02/27/23 0929   pantoprazole (PROTONIX) injection 40 mg  40 mg Intravenous Q24H Emokpae, Ejiroghene E, MD       polyethylene glycol (MIRALAX / GLYCOLAX) packet 17 g  17 g Oral Daily PRN Emokpae, Ejiroghene E, MD       sodium bicarbonate  tablet 650 mg  650 mg Oral Daily Emokpae, Ejiroghene E, MD   650 mg at 02/27/23 0929    Allergies as of 02/26/2023 - Review Complete 02/26/2023  Allergen Reaction Noted   Cozaar [losartan] Nausea And Vomiting, Swelling, and Rash 02/23/2015   Quinolones Itching, Nausea And Vomiting, Nausea Only, and Swelling 05/13/2012   Cymbalta [duloxetine hcl] Itching and Swelling 01/23/2016   Sulfa  antibiotics Nausea And Vomiting and Swelling 05/13/2012   Synalar [fluocinolone] Swelling 01/23/2016   Cipro [ciprofloxacin hcl] Nausea And Vomiting and Swelling 03/16/2022   Diovan [valsartan] Nausea And Vomiting and Swelling 12/22/2014   Glucophage [metformin] Other (See Comments)    Ketalar [ketamine] Swelling 11/28/2021   Norco [hydrocodone-acetaminophen] Itching and Swelling 11/28/2021   Norvasc [amlodipine besylate] Itching and Swelling 11/28/2021   Nsaids Nausea And Vomiting and Other (See Comments) 08/29/2016   Tape Itching and Rash 06/15/2018   Zestril [lisinopril] Swelling and Rash     Family History  Problem Relation Age of Onset   Diabetes Mother    Colon cancer Brother        POSSIBLY. Patient states diagnosed at age 72, then later states age 53. unclear and limited historian   Esophageal cancer Neg Hx    Rectal cancer Neg Hx    Stomach cancer Neg Hx     Social History   Socioeconomic History   Marital status: Married    Spouse name: Not on file   Number of children: 2   Years of education: Not on file   Highest education level: Not on file  Occupational History   Occupation: Disabled  Tobacco Use   Smoking status: Never   Smokeless tobacco: Never  Vaping Use   Vaping Use: Never used  Substance and Sexual Activity   Alcohol use: No   Drug use: No   Sexual activity: Not on file    Comment: Hysterectomy  Other Topics Concern   Not on file  Social History Narrative   Patient is married 2 children she is disabled, 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    Social Determinants of Health   Financial Resource Strain: Not on file  Food Insecurity: No Food Insecurity (02/17/2023)   Hunger Vital Sign    Worried About Running Out of Food in the Last Year: Never true    Ran Out of Food in the Last Year: Never true  Transportation Needs: No Transportation Needs (02/17/2023)   PRAPARE - Administrator, Civil Service (Medical): No    Lack of Transportation (Non-Medical): No  Physical Activity: Not on file  Stress: Not on file  Social Connections: Not on file  Intimate Partner Violence: Not At Risk (02/17/2023)   Humiliation, Afraid, Rape, and Kick questionnaire    Fear of Current or Ex-Partner: No    Emotionally Abused: No    Physically Abused: No    Sexually Abused: No    Review of Systems   Gen: Denies any fever, chills, loss of appetite, change in weight or weight loss CV: Denies chest pain, heart palpitations, syncope, edema  Resp: Denies shortness of breath with rest, cough, wheezing, coughing up blood, and pleurisy. GI:  denies melena,  vomiting, diarrhea, constipation, dysphagia, odyonophagia, early satiety or weight loss. +rectal bleeding +nausea +mid/upper abdominal pain GU : Denies urinary burning, blood in urine, urinary frequency, and urinary incontinence. MS: Denies joint pain, limitation of movement, swelling, cramps, and atrophy.  Derm: Denies rash, itching, dry skin, hives. Psych: Denies depression, anxiety, memory loss, hallucinations, and confusion. Heme: Denies bruising or bleeding Neuro:  Denies any headaches, dizziness, paresthesias, shaking  Physical Exam   Vital Signs in  last 24 hours: Temp:  [97.5 F (36.4 C)-98.6 F (37 C)] 98.6 F (37 C) (05/01 0756) Pulse Rate:  [83-104] 88 (05/01 0756) Resp:  [14-18] 14 (05/01 0756) BP:  (99-149)/(53-80) 111/59 (05/01 0756) SpO2:  [97 %-100 %] 100 % (05/01 0756) Weight:  [55.9 kg] 55.9 kg (04/30 1434) Last BM Date : 02/26/23  General:   Alert,  Well-developed, well-nourished, pleasant and cooperative in NAD Head:  Normocephalic and atraumatic. Eyes:  Sclera clear, no icterus.   Conjunctiva pink. Ears:  Normal auditory acuity. Mouth:  No deformity or lesions, dentition normal. Lungs:  Clear throughout to auscultation.   No wheezes, crackles, or rhonchi. No acute distress. Heart:  Regular rate and rhythm; no murmurs, clicks, rubs,  or gallops. Abdomen:  Soft, nontender and nondistended. No masses, hepatosplenomegaly or hernias noted. Normal bowel sounds, without guarding, and without rebound.   Msk:  Symmetrical without gross deformities. Normal posture. Extremities:  Without clubbing or edema. Neurologic:  Alert and  oriented x4. Skin:  Intact without significant lesions or rashes. Psych:  Alert and cooperative. Normal mood and affect.  Labs/Studies   Recent Labs Recent Labs    02/25/23 0946 02/26/23 1511 02/27/23 0509  WBC 9.2 8.5 8.1  HGB 9.6* 9.0* 9.0*  HCT 29.6* 27.3* 29.0*  PLT 196 171 155   BMET Recent Labs    02/25/23 0602 02/26/23 1511  NA 139 132*  K 3.6 3.7  CL 111 107  CO2 17* 17*  GLUCOSE 208* 328*  BUN 78* 69*  CREATININE 2.43* 2.22*  CALCIUM 8.0* 7.9*   LFT Recent Labs    02/26/23 1511  PROT 4.3*  ALBUMIN 1.8*  AST 14*  ALT 9  ALKPHOS 78  BILITOT 0.7    Assessment   Heather [name] is a 72 y.o. year old female with a history of small bowel Crohn's disease diagnosed in Jan 2022 and previously on Humira, history of pancreatitis possibly related to hypertriglyceridemia and use of Victoza, CKD, depression, diabetes, GERD, anxiety, IBS, hyperlipidemia, stroke who presented with rectal bleeding with dark red blood, reportedly small volume and associated dizziness upon sitting/standing. Recent admission with EGD showing esophageal  and duodenal ulcers treated with epi/clips. GI consulted for recurrent rectal bleeding  Rectal bleeding/Anemia: hgb 9, down from 9.6 on 4/29. 2 episodes of reported bright red blood in stool yesterday. No further rectal bleeding since prior to admission. Esophageal ulcers and duodenal ulcers noted on recent EGDs in setting of NSAID use. She is having some nausea and endorses some lower abdominal pain. As hgb has remained relatively stable and no further rectal bleeding, would recommend PPI BID, carafate 1g QID, trend h&h, monitor for overt GI bleeding, if hgb continues to trend down or bleed recurs, consider repeat EGD.   Plan / Recommendations   PPI BID Trend h&h Monitor for overt GI bleeding Avoid all NSAIDs  Hold off on further endoscopic evaluation unless rectal bleeding persists/significant drop in hgb 6. Carafate 1g QID     02/27/2023, 9:39 AM  Khamani Daniely L. Jeanmarie Hubert, MSN, APRN, AGNP-C Adult-Gerontology Nurse Practitioner [facility] at [facility]

## 2023-02-28 ENCOUNTER — Inpatient Hospital Stay (HOSPITAL_COMMUNITY): Payer: Medicare PPO | Admitting: Certified Registered Nurse Anesthetist

## 2023-02-28 ENCOUNTER — Encounter (HOSPITAL_COMMUNITY)
Admission: EM | Disposition: A | Discharge status: Home / Self Care | Payer: Self-pay | Source: Home / Self Care | Attending: Family Medicine

## 2023-02-28 ENCOUNTER — Encounter (HOSPITAL_COMMUNITY): Payer: Self-pay | Admitting: Family Medicine

## 2023-02-28 DIAGNOSIS — D62 Acute posthemorrhagic anemia: Secondary | ICD-10-CM | POA: Diagnosis present

## 2023-02-28 DIAGNOSIS — Z8719 Personal history of other diseases of the digestive system: Secondary | ICD-10-CM | POA: Diagnosis not present

## 2023-02-28 DIAGNOSIS — I509 Heart failure, unspecified: Secondary | ICD-10-CM

## 2023-02-28 DIAGNOSIS — F419 Anxiety disorder, unspecified: Secondary | ICD-10-CM | POA: Diagnosis present

## 2023-02-28 DIAGNOSIS — K269 Duodenal ulcer, unspecified as acute or chronic, without hemorrhage or perforation: Secondary | ICD-10-CM

## 2023-02-28 DIAGNOSIS — I10 Essential (primary) hypertension: Secondary | ICD-10-CM | POA: Diagnosis not present

## 2023-02-28 DIAGNOSIS — Z9889 Other specified postprocedural states: Secondary | ICD-10-CM | POA: Diagnosis not present

## 2023-02-28 DIAGNOSIS — K921 Melena: Secondary | ICD-10-CM | POA: Diagnosis not present

## 2023-02-28 DIAGNOSIS — E119 Type 2 diabetes mellitus without complications: Secondary | ICD-10-CM

## 2023-02-28 DIAGNOSIS — K5731 Diverticulosis of large intestine without perforation or abscess with bleeding: Secondary | ICD-10-CM | POA: Diagnosis present

## 2023-02-28 DIAGNOSIS — K5730 Diverticulosis of large intestine without perforation or abscess without bleeding: Secondary | ICD-10-CM | POA: Diagnosis not present

## 2023-02-28 DIAGNOSIS — R451 Restlessness and agitation: Secondary | ICD-10-CM | POA: Diagnosis present

## 2023-02-28 DIAGNOSIS — K209 Esophagitis, unspecified without bleeding: Secondary | ICD-10-CM

## 2023-02-28 DIAGNOSIS — N3000 Acute cystitis without hematuria: Secondary | ICD-10-CM | POA: Diagnosis not present

## 2023-02-28 DIAGNOSIS — E8721 Acute metabolic acidosis: Secondary | ICD-10-CM | POA: Diagnosis present

## 2023-02-28 DIAGNOSIS — Z800 Family history of malignant neoplasm of digestive organs: Secondary | ICD-10-CM | POA: Diagnosis not present

## 2023-02-28 DIAGNOSIS — K2971 Gastritis, unspecified, with bleeding: Secondary | ICD-10-CM | POA: Diagnosis present

## 2023-02-28 DIAGNOSIS — Z833 Family history of diabetes mellitus: Secondary | ICD-10-CM | POA: Diagnosis not present

## 2023-02-28 DIAGNOSIS — I110 Hypertensive heart disease with heart failure: Secondary | ICD-10-CM | POA: Diagnosis not present

## 2023-02-28 DIAGNOSIS — Z8673 Personal history of transient ischemic attack (TIA), and cerebral infarction without residual deficits: Secondary | ICD-10-CM | POA: Diagnosis not present

## 2023-02-28 DIAGNOSIS — K264 Chronic or unspecified duodenal ulcer with hemorrhage: Secondary | ICD-10-CM | POA: Diagnosis not present

## 2023-02-28 DIAGNOSIS — I2510 Atherosclerotic heart disease of native coronary artery without angina pectoris: Secondary | ICD-10-CM | POA: Diagnosis present

## 2023-02-28 DIAGNOSIS — E1322 Other specified diabetes mellitus with diabetic chronic kidney disease: Secondary | ICD-10-CM | POA: Diagnosis not present

## 2023-02-28 DIAGNOSIS — E1140 Type 2 diabetes mellitus with diabetic neuropathy, unspecified: Secondary | ICD-10-CM | POA: Diagnosis present

## 2023-02-28 DIAGNOSIS — E1122 Type 2 diabetes mellitus with diabetic chronic kidney disease: Secondary | ICD-10-CM | POA: Diagnosis present

## 2023-02-28 DIAGNOSIS — E1165 Type 2 diabetes mellitus with hyperglycemia: Secondary | ICD-10-CM | POA: Diagnosis present

## 2023-02-28 DIAGNOSIS — K5000 Crohn's disease of small intestine without complications: Secondary | ICD-10-CM | POA: Diagnosis present

## 2023-02-28 DIAGNOSIS — F32A Depression, unspecified: Secondary | ICD-10-CM | POA: Diagnosis present

## 2023-02-28 DIAGNOSIS — K279 Peptic ulcer, site unspecified, unspecified as acute or chronic, without hemorrhage or perforation: Secondary | ICD-10-CM | POA: Diagnosis not present

## 2023-02-28 DIAGNOSIS — I5032 Chronic diastolic (congestive) heart failure: Secondary | ICD-10-CM | POA: Diagnosis not present

## 2023-02-28 DIAGNOSIS — Z794 Long term (current) use of insulin: Secondary | ICD-10-CM | POA: Diagnosis not present

## 2023-02-28 DIAGNOSIS — K50011 Crohn's disease of small intestine with rectal bleeding: Secondary | ICD-10-CM | POA: Diagnosis not present

## 2023-02-28 DIAGNOSIS — K625 Hemorrhage of anus and rectum: Secondary | ICD-10-CM | POA: Diagnosis not present

## 2023-02-28 DIAGNOSIS — I130 Hypertensive heart and chronic kidney disease with heart failure and stage 1 through stage 4 chronic kidney disease, or unspecified chronic kidney disease: Secondary | ICD-10-CM | POA: Diagnosis present

## 2023-02-28 DIAGNOSIS — E871 Hypo-osmolality and hyponatremia: Secondary | ICD-10-CM | POA: Diagnosis not present

## 2023-02-28 DIAGNOSIS — N184 Chronic kidney disease, stage 4 (severe): Secondary | ICD-10-CM | POA: Diagnosis present

## 2023-02-28 DIAGNOSIS — K922 Gastrointestinal hemorrhage, unspecified: Principal | ICD-10-CM | POA: Diagnosis present

## 2023-02-28 DIAGNOSIS — I159 Secondary hypertension, unspecified: Secondary | ICD-10-CM | POA: Diagnosis not present

## 2023-02-28 DIAGNOSIS — K254 Chronic or unspecified gastric ulcer with hemorrhage: Secondary | ICD-10-CM | POA: Diagnosis not present

## 2023-02-28 DIAGNOSIS — L89312 Pressure ulcer of right buttock, stage 2: Secondary | ICD-10-CM | POA: Diagnosis present

## 2023-02-28 DIAGNOSIS — E782 Mixed hyperlipidemia: Secondary | ICD-10-CM | POA: Diagnosis present

## 2023-02-28 HISTORY — PX: ESOPHAGOGASTRODUODENOSCOPY (EGD) WITH PROPOFOL: SHX5813

## 2023-02-28 LAB — CBC
HCT: 20.6 % — ABNORMAL LOW (ref 36.0–46.0)
Hemoglobin: 6.7 g/dL — CL (ref 12.0–15.0)
MCH: 31.2 pg (ref 26.0–34.0)
MCHC: 32.5 g/dL (ref 30.0–36.0)
MCV: 95.8 fL (ref 80.0–100.0)
Platelets: 149 10*3/uL — ABNORMAL LOW (ref 150–400)
RBC: 2.15 MIL/uL — ABNORMAL LOW (ref 3.87–5.11)
RDW: 16.8 % — ABNORMAL HIGH (ref 11.5–15.5)
WBC: 7.2 10*3/uL (ref 4.0–10.5)
nRBC: 0 % (ref 0.0–0.2)

## 2023-02-28 LAB — BASIC METABOLIC PANEL
Anion gap: 8 (ref 5–15)
BUN: 70 mg/dL — ABNORMAL HIGH (ref 8–23)
CO2: 19 mmol/L — ABNORMAL LOW (ref 22–32)
Calcium: 8 mg/dL — ABNORMAL LOW (ref 8.9–10.3)
Chloride: 111 mmol/L (ref 98–111)
Creatinine, Ser: 2.21 mg/dL — ABNORMAL HIGH (ref 0.44–1.00)
GFR, Estimated: 23 mL/min — ABNORMAL LOW (ref >60)
Glucose, Bld: 101 mg/dL — ABNORMAL HIGH (ref 70–99)
Potassium: 3.2 mmol/L — ABNORMAL LOW (ref 3.5–5.1)
Sodium: 138 mmol/L (ref 135–145)

## 2023-02-28 LAB — GLUCOSE, CAPILLARY
Glucose-Capillary: 78 mg/dL (ref 70–99)
Glucose-Capillary: 116 mg/dL — ABNORMAL HIGH (ref 70–99)
Glucose-Capillary: 122 mg/dL — ABNORMAL HIGH (ref 70–99)
Glucose-Capillary: 74 mg/dL (ref 70–99)
Glucose-Capillary: 102 mg/dL — ABNORMAL HIGH (ref 70–99)
Glucose-Capillary: 112 mg/dL — ABNORMAL HIGH (ref 70–99)
Glucose-Capillary: 100 mg/dL — ABNORMAL HIGH (ref 70–99)
Glucose-Capillary: 118 mg/dL — ABNORMAL HIGH (ref 70–99)

## 2023-02-28 LAB — TYPE AND SCREEN
ABO/RH(D): O POS
Unit division: 0

## 2023-02-28 LAB — HEMOGLOBIN AND HEMATOCRIT, BLOOD
HCT: 19.9 % — ABNORMAL LOW (ref 36.0–46.0)
Hemoglobin: 6.5 g/dL — CL (ref 12.0–15.0)

## 2023-02-28 LAB — BPAM RBC
ISSUE DATE / TIME: 202405021107
Unit Type and Rh: 5100

## 2023-02-28 LAB — PREPARE RBC (CROSSMATCH)

## 2023-02-28 SURGERY — ESOPHAGOGASTRODUODENOSCOPY (EGD) WITH PROPOFOL
Anesthesia: General

## 2023-02-28 MED ORDER — FENTANYL CITRATE PF 50 MCG/ML IJ SOSY: 50.0000 ug | PREFILLED_SYRINGE | INTRAMUSCULAR | Status: DC | PRN

## 2023-02-28 MED ORDER — PEG 3350-KCL-NA BICARB-NACL 420 G PO SOLR
4000.0000 mL | Freq: Once | ORAL | Status: AC
  Administered 2023-02-28: 4000 mL via ORAL
  Filled 2023-02-28: qty 4000

## 2023-02-28 MED ORDER — DEXTROSE 50 % IV SOLN
INTRAVENOUS | Status: AC
  Filled 2023-02-28: qty 50

## 2023-02-28 MED ORDER — SODIUM CHLORIDE 0.9 % IV SOLN: INTRAVENOUS | Status: DC

## 2023-02-28 MED ORDER — SODIUM CHLORIDE 0.9% IV SOLUTION: Freq: Once | INTRAVENOUS | Status: AC

## 2023-02-28 MED ORDER — LACTATED RINGERS IV SOLN: INTRAVENOUS | Status: DC | PRN

## 2023-02-28 MED ORDER — POTASSIUM CHLORIDE CRYS ER 20 MEQ PO TBCR
40.0000 meq | EXTENDED_RELEASE_TABLET | ORAL | Status: AC
  Administered 2023-02-28 (×2): 40 meq via ORAL
  Filled 2023-02-28 (×2): qty 2

## 2023-02-28 MED ORDER — BISACODYL 5 MG PO TBEC
15.0000 mg | DELAYED_RELEASE_TABLET | Freq: Once | ORAL | Status: AC
  Administered 2023-02-28: 15 mg via ORAL
  Filled 2023-02-28 (×2): qty 3

## 2023-02-28 MED ORDER — POTASSIUM CHLORIDE 10 MEQ/100ML IV SOLN
10.0000 meq | INTRAVENOUS | Status: AC
  Administered 2023-02-28 (×4): 10 meq via INTRAVENOUS
  Filled 2023-02-28 (×4): qty 100

## 2023-02-28 MED ORDER — FENTANYL CITRATE (PF) 100 MCG/2ML IJ SOLN
50.0000 ug | Freq: Once | INTRAMUSCULAR | Status: AC
  Administered 2023-02-28: 50 ug via INTRAVENOUS

## 2023-02-28 MED ORDER — PROPOFOL 10 MG/ML IV BOLUS
INTRAVENOUS | Status: DC | PRN
  Administered 2023-02-28: 50 mg via INTRAVENOUS

## 2023-02-28 MED ORDER — FENTANYL CITRATE PF 50 MCG/ML IJ SOSY
PREFILLED_SYRINGE | INTRAMUSCULAR | Status: AC
  Filled 2023-02-28: qty 1

## 2023-02-28 MED ORDER — PHENYLEPHRINE HCL (PRESSORS) 10 MG/ML IV SOLN
INTRAVENOUS | Status: DC | PRN
  Administered 2023-02-28: 100 ug via INTRAVENOUS

## 2023-02-28 MED ORDER — PROPOFOL 500 MG/50ML IV EMUL
INTRAVENOUS | Status: DC | PRN
  Administered 2023-02-28: 75 ug/kg/min via INTRAVENOUS

## 2023-02-28 MED ORDER — LACTATED RINGERS IV SOLN: INTRAVENOUS | Status: DC

## 2023-02-28 MED ORDER — STERILE WATER FOR IRRIGATION IR SOLN
Status: DC | PRN
  Administered 2023-02-28: 60 mL

## 2023-02-28 MED ORDER — DEXTROSE 50 % IV SOLN
25.0000 mL | Freq: Once | INTRAVENOUS | Status: AC
  Administered 2023-02-28: 25 mL via INTRAVENOUS

## 2023-02-28 NOTE — Progress Notes (Signed)
Date and time results received: 02/28/23 0618   Test: hemoglobin    Critical Value: 6.7  Name of Provider Notified: Adefeso   Orders Received? Or Actions Taken?: Orders Received - See Orders for details : Repeat H & H

## 2023-02-28 NOTE — Progress Notes (Signed)
Second unit of blood completed. Pt up to unit with blood transfusing at 250 ml/hr. Rate change not documented in computer; therefore unable to compute total volume at end of transfusion.

## 2023-02-28 NOTE — Anesthesia Procedure Notes (Signed)
Date/Time: 02/28/2023 2:20 PM  Performed by: Franco Nones, CRNAPre-anesthesia Checklist: Patient identified, Emergency Drugs available, Suction available, Timeout performed and Patient being monitored Patient Re-evaluated:Patient Re-evaluated prior to induction Oxygen Delivery Method: Nasal Cannula

## 2023-02-28 NOTE — OR Nursing (Signed)
Complaints of headache and abdominal pain rating pain a 10 . Telepohene order from dr. Johnnette Litter to give fentynal 50 mg iv

## 2023-02-28 NOTE — Anesthesia Preprocedure Evaluation (Signed)
Anesthesia Evaluation  Patient identified by MRN, date of birth, ID band Patient awake    Reviewed: Allergy & Precautions, H&P , NPO status , Patient's Chart, lab work & pertinent test results, reviewed documented beta blocker date and time   Airway Mallampati: III  TM Distance: >3 FB Neck ROM: Full    Dental  (+) Dental Advisory Given, Teeth Intact   Pulmonary shortness of breath (wheel chair bound) and with exertion, pneumonia   Pulmonary exam normal breath sounds clear to auscultation       Cardiovascular Exercise Tolerance: Poor METS (wheelchair bound): hypertension, Pt. on medications and Pt. on home beta blockers + CAD and +CHF  Normal cardiovascular exam Rhythm:Regular Rate:Normal  1. Left ventricular ejection fraction, by estimation, is 60 to 65%. The  left ventricle has normal function. The left ventricle has no regional  wall motion abnormalities. Left ventricular diastolic parameters are  consistent with Grade I diastolic  dysfunction (impaired relaxation).   2. Right ventricular systolic function is normal. The right ventricular  size is normal.   3. Left atrial size was moderately dilated.   4. Right atrial size was mildly dilated.   5. The mitral valve is normal in structure. Trivial mitral valve  regurgitation. No evidence of mitral stenosis. Moderate mitral annular  calcification.   6. The aortic valve is tricuspid. There is moderate calcification of the  aortic valve. Aortic valve regurgitation is not visualized. Mild aortic  valve stenosis. Aortic valve area, by VTI measures 1.95 cm. Aortic valve  mean gradient measures 7.0 mmHg.  Aortic valve Vmax measures 1.69 m/s.   7. The inferior vena cava is normal in size with greater than 50%  respiratory variability, suggesting right atrial pressure of 3 mmHg.   Comparison(s): EF 60-65%.     Neuro/Psych  Headaches PSYCHIATRIC DISORDERS Anxiety Depression      Neuromuscular disease CVA    GI/Hepatic Neg liver ROS, hiatal hernia,GERD  Medicated,,  Endo/Other  diabetes, Well Controlled, Type 2, Insulin Dependent    Renal/GU Renal disease  negative genitourinary   Musculoskeletal  (+) Arthritis , Osteoarthritis,    Abdominal   Peds negative pediatric ROS (+)  Hematology  (+) Blood dyscrasia, anemia   Anesthesia Other Findings   Reproductive/Obstetrics negative OB ROS                             Anesthesia Physical Anesthesia Plan  ASA: 4 and emergent  Anesthesia Plan: General   Post-op Pain Management: Minimal or no pain anticipated   Induction: Intravenous  PONV Risk Score and Plan: Propofol infusion  Airway Management Planned: Nasal Cannula and Natural Airway  Additional Equipment:   Intra-op Plan:   Post-operative Plan: Possible Post-op intubation/ventilation  Informed Consent: I have reviewed the patients History and Physical, chart, labs and discussed the procedure including the risks, benefits and alternatives for the proposed anesthesia with the patient or authorized representative who has indicated his/her understanding and acceptance.     Dental advisory given  Plan Discussed with: CRNA and Surgeon  Anesthesia Plan Comments:         Anesthesia Quick Evaluation

## 2023-02-28 NOTE — Transfer of Care (Signed)
Immediate Anesthesia Transfer of Care Note  Patient: Heather [name]  Procedure(s) Performed: ESOPHAGOGASTRODUODENOSCOPY (EGD) WITH PROPOFOL  Patient Location: PACU  Anesthesia Type:General  Level of Consciousness: drowsy  Airway & Oxygen Therapy: Patient Spontanous Breathing  Post-op Assessment: Report given to RN and Post -op Vital signs reviewed and stable  Post vital signs: Reviewed and stable  Last Vitals:  Vitals Value Taken Time  BP 102/43 02/28/23 1446  Temp 97.4 05/02824 1446  Pulse 80 02/28/23 1446  Resp 12 02/28/23 1446  SpO2 100 % 02/28/23 1446  Vitals shown include unvalidated device data.  Last Pain:  Vitals:   02/28/23 1425  TempSrc:   PainSc: 7       Patients Stated Pain Goal: 0 (02/28/23 1307)  Complications: No notable events documented.

## 2023-02-28 NOTE — Progress Notes (Signed)
Patient briefly seen due to drop in Hgb. Hemoglobin down to 6.5 this morning, down from 9.0. she had 7-8 loose bloody stools overnight, Flexiseal has been placed and there is liquid black stool present. Patient feels fatigued. Some nausea. No abdominal pain. Discussed need for EGD. Patient agreeable. She is alert and oriented to person, place, time.   She will need blood transfusion prior to EGD.  Leanna Battles. [facility] [phone] 5/2/20249:20 AM

## 2023-02-28 NOTE — OR Nursing (Signed)
Sleeping. No facial grimincing

## 2023-02-28 NOTE — Progress Notes (Addendum)
Hgb 6.5.Dr. [name] made aware. New orders given for NPO and H&H checks.

## 2023-02-28 NOTE — OR Nursing (Signed)
Stated headache eased up a little bit bit

## 2023-02-28 NOTE — Progress Notes (Signed)
Patient alert and oriented x4, able to answer all orientation questions correctly. Patient is refusing to drink her bowel prep, she stated "it makes me sick". Nurse reinforced importance of drinking bowel prep for procedure, patient verbalizes understanding.

## 2023-02-28 NOTE — Progress Notes (Signed)
PROGRESS NOTE     Heather [name], is a 72 y.o. female, DOB - 06/05/1951, MWU:[id number]  Admit date - 02/26/2023   Admitting Physician Heather Ke [name], MD  Outpatient Primary MD for the patient is Heather Peri, MD  LOS - 0  Chief Complaint  Patient presents with   Rectal Bleeding        Brief Narrative:  72 y.o. female with medical history significant for GI bleed, CKD 4, CVA, D CHF,  DM, HTN with history of peptic ulcer disease with recent bleeding from duodenal ulcer,, small bowel Crohn's disease admitted with small-volume bright red blood per rectum on 02/26/2023    -Assessment and Plan: 1)Acute GI bleed -Patient's hemoglobin was 3.2 on 02/17/2023--patient required transfusion of PRBC and endoscopy x 2 during that hospitalization -Discharge Hgb on 02/25/2023 was 9.6 -Hemoglobin currently around 9  S/p recent  EGD and flex sig 4/23 and repeat EGD 4/26 due to persistent bleeding Initial EGD from 02/19/2023 showed esophagitis with ulcers as well extensive duodenal ulceration on initial EGD.  Visible vessel treated with epinephrine and hemostasis clip.  She developed recurrent bleeding  - underwent a repeat EGD on 02/22/23 which revealed a visible vessel at a different site in the ulcer crater requiring epinephrine and clip placement.   -H. pylori negative - Limited FS on 02/22/2023 was with  poor prep nondiagnostic. -GI consult appreciated 02/28/23 -Hgb down to 6.5.. -Transfused 2 units of PRBC -Repeat EGD without explanation for patient's current drop in hemoglobin and ongoing bleeding -c/n IV Protonix and Carafate -Plan is for diagnostic ileocolonoscopy on 03/01/2023  2)Chronic diastolic CHF (congestive heart failure) (HCC) Stable and compensated.  Last echo 11/2022, EF of 60 to 65%, grade 1 DD.  Not on diuretic. -Monitor closely  3)Diabetes mellitus (HCC) -A1c 6.4 reflecting good diabetic control PTA -Hold Lantus until oral intake is more reliable Use Novolog/Humalog Sliding  scale insulin with Accu-Cheks/Fingersticks as ordered   4)HTN Stable, reduce metoprolol to 12.5 mg twice daily   5)Crohn's disease of small intestine without complication (HCC) Stable.  Previously treated with Humira currently not on medications for this -Recent biopsy with nondiagnostic  6) CKD stage -IV -Renal function currently close to baseline -Renally adjust medications, avoid nephrotoxic agents / dehydration  / hypotension  Status is: Inpatient   Disposition: The patient is from: Home              Anticipated d/c is to: Home              Anticipated d/c date is: 2 days              Patient currently is not medically stable to d/c. Barriers: Not Clinically Stable-   Code Status :  -  Code Status: Full Code   Family Communication:    NA (patient is alert, awake and coherent)   DVT Prophylaxis  :   - SCDs  SCDs Start: 02/26/23 2036   Lab Results  Component Value Date   PLT 149 (L) 02/28/2023   Inpatient Medications  Scheduled Meds:  allopurinol  100 mg Oral Daily   Chlorhexidine Gluconate Cloth  6 each Topical Daily   fentaNYL       insulin aspart  0-15 Units Subcutaneous Q4H   levETIRAcetam  500 mg Oral BID   metoprolol tartrate  12.5 mg Oral BID   pantoprazole (PROTONIX) IV  40 mg Intravenous Q12H   polyethylene glycol-electrolytes  4,000 mL Oral Once   potassium chloride  40  mEq Oral Q3H   sodium bicarbonate  650 mg Oral Daily   sodium chloride flush  10-40 mL Intracatheter Q12H   sucralfate  1 g Oral TID WC & HS   Continuous Infusions:  sodium chloride     lactated ringers     potassium chloride     PRN Meds:.acetaminophen **OR** acetaminophen, fentaNYL, fentaNYL (SUBLIMAZE) injection, polyethylene glycol, sodium chloride flush   Anti-infectives (From admission, onward)    Start     Dose/Rate Route Frequency Ordered Stop   02/26/23 2200  cefdinir (OMNICEF) capsule 300 mg  Status:  Discontinued        300 mg Oral 2 times daily 02/26/23 2038 02/26/23  2041      Subjective: Heather [name] today has no fevers, no emesis,  No chest pain,   - -Bloody BM noted overnight -Hgb down to 6.5 --patient complains of fatigue and weakness  Objective: Vitals:   02/28/23 1531 02/28/23 1540 02/28/23 1545 02/28/23 1648  BP: (!) 161/65  (!) 163/66 (!) 136/45  Pulse: 87   92  Resp: (!) 0  (!) 5 16  Temp:    97.9 F (36.6 C)  TempSrc:    Oral  SpO2: 100% 100%  100%  Weight:      Height:        Intake/Output Summary (Last 24 hours) at 02/28/2023 1841 Last data filed at 02/28/2023 1630 Gross per 24 hour  Intake 815 ml  Output --  Net 815 ml   Filed Weights   02/26/23 1434 02/28/23 1307  Weight: 55.9 kg 56 kg   Physical Exam Gen:- Awake Alert,  in no apparent distress  HEENT:- [place].AT, No sclera icterus Neck-Supple Neck,No JVD,.  Lungs-  CTAB , fair symmetrical air movement CV- S1, S2 normal, regular  Abd-  +ve B.Sounds, Abd Soft, No tenderness,    Extremity/Skin:- No  edema, pedal pulses present  Psych-affect is appropriate, oriented x3 Neuro-no new focal deficits, no tremors  Data Reviewed: I have personally reviewed following labs and imaging studies  CBC: Recent Labs  Lab 02/24/23 0452 02/25/23 0946 02/26/23 1511 02/27/23 0509 02/28/23 0456 02/28/23 0654  WBC 7.6 9.2 8.5 8.1 7.2  --   NEUTROABS  --   --  7.1  --   --   --   HGB 11.2* 9.6* 9.0* 9.0* 6.7* 6.5*  HCT 33.6* 29.6* 27.3* 29.0* 20.6* 19.9*  MCV 91.6 96.7 94.8 99.7 95.8  --   PLT 191 196 171 155 149*  --    Basic Metabolic Panel: Recent Labs  Lab 02/23/23 0434 02/24/23 0452 02/25/23 0602 02/26/23 1511 02/28/23 0456  NA 136 137 139 132* 138  K 3.6 3.9 3.6 3.7 3.2*  CL 108 110 111 107 111  CO2 19* 19* 17* 17* 19*  GLUCOSE 113* 79 208* 328* 101*  BUN 83* 82* 78* 69* 70*  CREATININE 2.36* 2.33* 2.43* 2.22* 2.21*  CALCIUM 8.0* 8.1* 8.0* 7.9* 8.0*   GFR: Estimated Creatinine Clearance: 20.5 mL/min (A) (by C-G formula based on SCr of 2.21 mg/dL  (H)). Liver Function Tests: Recent Labs  Lab 02/26/23 1511  AST 14*  ALT 9  ALKPHOS 78  BILITOT 0.7  PROT 4.3*  ALBUMIN 1.8*   HbA1C: Recent Labs    02/26/23 1511  HGBA1C 5.7*   Recent Results (from the past 240 hour(s))  Urine Culture (for pregnant, neutropenic or urologic patients or patients with an indwelling urinary catheter)     Status: Abnormal  Collection Time: 02/20/23  3:20 PM   Specimen: Urine, Clean Catch  Result Value Ref Range Status   Specimen Description   Final    URINE, CLEAN CATCH Performed at [facility], [address], [place], [place] [address]    Special Requests   Final    NONE Performed at [facility], [address], [place], [place] [address]    Culture (A)  Final    <10,000 COLONIES/mL INSIGNIFICANT GROWTH Performed at [facility], [address], [place], [place] [address]    Report Status 02/21/2023 FINAL  Final    Radiology Studies: [place] EKG SITE RITE  Result Date: 02/27/2023 If Site Rite image not attached, placement could not be confirmed due to current cardiac rhythm.   Scheduled Meds:  allopurinol  100 mg Oral Daily   Chlorhexidine Gluconate Cloth  6 each Topical Daily   fentaNYL       insulin aspart  0-15 Units Subcutaneous Q4H   levETIRAcetam  500 mg Oral BID   metoprolol tartrate  12.5 mg Oral BID   pantoprazole (PROTONIX) IV  40 mg Intravenous Q12H   polyethylene glycol-electrolytes  4,000 mL Oral Once   potassium chloride  40 mEq Oral Q3H   sodium bicarbonate  650 mg Oral Daily   sodium chloride flush  10-40 mL Intracatheter Q12H   sucralfate  1 g Oral TID WC & HS   Continuous Infusions:  sodium chloride     lactated ringers     potassium chloride       LOS: 0 days   Shon Hale M.D on 02/28/2023 at 6:41 PM  Go to [email or web address] - for contact info  [facility] - Office  [phone]  If 7PM-7AM, please contact night-coverage [email or web address] 02/28/2023, 6:41 PM

## 2023-02-28 NOTE — Op Note (Signed)
[facility] Patient Name: Heather [name] Procedure Date: 02/28/2023 1:59 PM MRN: [id number] Date of Birth: August 27, 1951 Attending MD: Gennette Pac , MD, [id number] CSN: [id number] Age: 72 Admit Type: Inpatient Procedure:                Upper GI endoscopy Indications:              Hematochezia Providers:                Gennette Pac, MD, Angelica Ran, Pandora Leiter, Technician Referring MD:              Medicines:                Propofol per Anesthesia; recent EGD x 2 for                            actively bleeding duodenal ulcer requiring                            treatment. Readmitted with hematochezia and                            significant drop in hemoglobin. Complications:            No immediate complications. Estimated Blood Loss:     Estimated blood loss: none. Procedure:                Pre-Anesthesia Assessment:                           - Prior to the procedure, a History and Physical                            was performed, and patient medications and                            allergies were reviewed. The patient's tolerance of                            previous anesthesia was also reviewed. The risks                            and benefits of the procedure and the sedation                            options and risks were discussed with the patient.                            All questions were answered, and informed consent                            was obtained. Prior Anticoagulants: The patient has                            taken no  anticoagulant or antiplatelet agents. ASA                            Grade Assessment: III - A patient with severe                            systemic disease. After reviewing the risks and                            benefits, the patient was deemed in satisfactory                            condition to undergo the procedure.                           After obtaining informed consent,  the endoscope was                            passed under direct vision. Throughout the                            procedure, the patient's blood pressure, pulse, and                            oxygen saturations were monitored continuously. The                            [id number]-[id number] ([id number]) scope was introduced through the                            mouth, and advanced to the third part of duodenum.                            The upper GI endoscopy was accomplished without                            difficulty. The patient tolerated the procedure                            well. Scope In: 2:31:13 PM Scope Out: 2:37:20 PM Total Procedure Duration: 0 hours 6 minutes 7 seconds  Findings:      Eroded esophageal mucosa involving the distal half of the tubular       esophagus. Some overlying exudate felt to be medication (Carafate).       Overall, significantly improved over that seen on prior EGD. Gastric       cavity empty. Gastric mucosa appeared normal. Pylorus patent.       Examination bulb, second, third portion revealed previously noted       geographic duodenal ulceration - much improved. 1 distal hemostasis clip       persists. Significant healing of ulcer crater noted since last week. No       lesions suspicious for recent bleeding found. Impression:               - Esophagitis?"much improved.                           -  Duodenal ulcer disease?"much improved?"1 persisting                            hemostasis clip.                           -No lesion found in the upper GI tract to explain                            patient's hematochezia and drop in hemoglobin Moderate Sedation:      Moderate (conscious) sedation was personally administered by an       anesthesia professional. The following parameters were monitored: oxygen       saturation, heart rate, blood pressure, respiratory rate, EKG, adequacy       of pulmonary ventilation, and response to care. Recommendation:            - Return patient to hospital ward for ongoing care.                           - Clear liquid diet. Continue current medications                            including twice daily PPI. Per algorithm discussed                            with patient, we will proceed with a diagnostic                            ileocolonoscopy tomorrow. Patient understands Dr.                            Levon Hedger will be performing in my absence. At                            patient request, I called Ayrianna Mcginniss, husband,                            [phone]. Reviewed today's findings and                            recommendations pursuing a ileocolonoscopy                            tomorrow. His questions were answered. He is                            agreeable. Further recommendations to follow.                           - Procedure Code(s):        --- Professional ---                           [phone], Esophagogastroduodenoscopy, flexible,  transoral; diagnostic, including collection of                            specimen(s) by brushing or washing, when performed                            (separate procedure) Diagnosis Code(s):        --- Professional ---                           K92.1, Melena (includes Hematochezia) CPT copyright 2022 American Medical Association. All rights reserved. The codes documented in this report are preliminary and upon coder review may  be revised to meet current compliance requirements. Gerrit Friends. Ranika Mcniel, MD Gennette Pac, MD 02/28/2023 2:55:10 PM This report has been signed electronically. Number of Addenda: 0

## 2023-03-01 ENCOUNTER — Encounter (HOSPITAL_COMMUNITY)
Admission: EM | Disposition: A | Discharge status: Home / Self Care | Payer: Self-pay | Source: Home / Self Care | Attending: Family Medicine

## 2023-03-01 ENCOUNTER — Inpatient Hospital Stay (HOSPITAL_COMMUNITY): Payer: Medicare PPO | Admitting: Certified Registered"

## 2023-03-01 ENCOUNTER — Encounter (HOSPITAL_COMMUNITY): Payer: Self-pay | Admitting: Family Medicine

## 2023-03-01 DIAGNOSIS — K50011 Crohn's disease of small intestine with rectal bleeding: Secondary | ICD-10-CM

## 2023-03-01 DIAGNOSIS — I110 Hypertensive heart disease with heart failure: Secondary | ICD-10-CM

## 2023-03-01 DIAGNOSIS — I2510 Atherosclerotic heart disease of native coronary artery without angina pectoris: Secondary | ICD-10-CM

## 2023-03-01 DIAGNOSIS — I10 Essential (primary) hypertension: Secondary | ICD-10-CM | POA: Diagnosis not present

## 2023-03-01 DIAGNOSIS — K5730 Diverticulosis of large intestine without perforation or abscess without bleeding: Secondary | ICD-10-CM | POA: Diagnosis not present

## 2023-03-01 DIAGNOSIS — N3000 Acute cystitis without hematuria: Secondary | ICD-10-CM | POA: Diagnosis not present

## 2023-03-01 DIAGNOSIS — I509 Heart failure, unspecified: Secondary | ICD-10-CM

## 2023-03-01 DIAGNOSIS — I5032 Chronic diastolic (congestive) heart failure: Secondary | ICD-10-CM | POA: Diagnosis not present

## 2023-03-01 DIAGNOSIS — E1149 Type 2 diabetes mellitus with other diabetic neurological complication: Secondary | ICD-10-CM

## 2023-03-01 DIAGNOSIS — K264 Chronic or unspecified duodenal ulcer with hemorrhage: Secondary | ICD-10-CM | POA: Diagnosis not present

## 2023-03-01 DIAGNOSIS — Z794 Long term (current) use of insulin: Secondary | ICD-10-CM

## 2023-03-01 HISTORY — PX: COLONOSCOPY WITH PROPOFOL: SHX5780

## 2023-03-01 LAB — BASIC METABOLIC PANEL
Anion gap: 9 (ref 5–15)
BUN: 60 mg/dL — ABNORMAL HIGH (ref 8–23)
CO2: 15 mmol/L — ABNORMAL LOW (ref 22–32)
Calcium: 7.7 mg/dL — ABNORMAL LOW (ref 8.9–10.3)
Chloride: 113 mmol/L — ABNORMAL HIGH (ref 98–111)
Creatinine, Ser: 2 mg/dL — ABNORMAL HIGH (ref 0.44–1.00)
GFR, Estimated: 26 mL/min — ABNORMAL LOW (ref >60)
Glucose, Bld: 139 mg/dL — ABNORMAL HIGH (ref 70–99)
Potassium: 4.9 mmol/L (ref 3.5–5.1)
Sodium: 137 mmol/L (ref 135–145)

## 2023-03-01 LAB — TYPE AND SCREEN
Antibody Screen: NEGATIVE
Unit division: 0

## 2023-03-01 LAB — GLUCOSE, CAPILLARY
Glucose-Capillary: 100 mg/dL — ABNORMAL HIGH (ref 70–99)
Glucose-Capillary: 112 mg/dL — ABNORMAL HIGH (ref 70–99)
Glucose-Capillary: 120 mg/dL — ABNORMAL HIGH (ref 70–99)
Glucose-Capillary: 127 mg/dL — ABNORMAL HIGH (ref 70–99)
Glucose-Capillary: 136 mg/dL — ABNORMAL HIGH (ref 70–99)
Glucose-Capillary: 148 mg/dL — ABNORMAL HIGH (ref 70–99)
Glucose-Capillary: 152 mg/dL — ABNORMAL HIGH (ref 70–99)
Glucose-Capillary: 155 mg/dL — ABNORMAL HIGH (ref 70–99)

## 2023-03-01 LAB — CBC
HCT: 33.2 % — ABNORMAL LOW (ref 36.0–46.0)
Hemoglobin: 11 g/dL — ABNORMAL LOW (ref 12.0–15.0)
MCH: 30.7 pg (ref 26.0–34.0)
MCHC: 33.1 g/dL (ref 30.0–36.0)
MCV: 92.7 fL (ref 80.0–100.0)
Platelets: 140 10*3/uL — ABNORMAL LOW (ref 150–400)
RBC: 3.58 MIL/uL — ABNORMAL LOW (ref 3.87–5.11)
RDW: 15.9 % — ABNORMAL HIGH (ref 11.5–15.5)
WBC: 8.2 10*3/uL (ref 4.0–10.5)
nRBC: 0 % (ref 0.0–0.2)

## 2023-03-01 LAB — HEMOGLOBIN AND HEMATOCRIT, BLOOD
HCT: 32.2 % — ABNORMAL LOW (ref 36.0–46.0)
Hemoglobin: 10.8 g/dL — ABNORMAL LOW (ref 12.0–15.0)

## 2023-03-01 LAB — BPAM RBC
Blood Product Expiration Date: 202406072359
Blood Product Expiration Date: 202406082359
ISSUE DATE / TIME: 202405021408
Unit Type and Rh: 5100

## 2023-03-01 SURGERY — COLONOSCOPY WITH PROPOFOL
Anesthesia: General

## 2023-03-01 MED ORDER — ONDANSETRON HCL 4 MG/2ML IJ SOLN
INTRAMUSCULAR | Status: AC
  Administered 2023-03-01: 4 mg via INTRAVENOUS
  Filled 2023-03-01: qty 2

## 2023-03-01 MED ORDER — LACTATED RINGERS IV SOLN: INTRAVENOUS | Status: DC

## 2023-03-01 MED ORDER — PHENYLEPHRINE HCL (PRESSORS) 10 MG/ML IV SOLN
INTRAVENOUS | Status: DC | PRN
  Administered 2023-03-01 (×4): 80 ug via INTRAVENOUS

## 2023-03-01 MED ORDER — LIDOCAINE HCL (CARDIAC) PF 100 MG/5ML IV SOSY
PREFILLED_SYRINGE | INTRAVENOUS | Status: DC | PRN
  Administered 2023-03-01: 50 mg via INTRAVENOUS

## 2023-03-01 MED ORDER — PROPOFOL 500 MG/50ML IV EMUL
INTRAVENOUS | Status: DC | PRN
  Administered 2023-03-01: 150 ug/kg/min via INTRAVENOUS

## 2023-03-01 MED ORDER — PROPOFOL 10 MG/ML IV BOLUS
INTRAVENOUS | Status: DC | PRN
  Administered 2023-03-01: 100 mg via INTRAVENOUS
  Administered 2023-03-01: 10 mg via INTRAVENOUS

## 2023-03-01 MED ORDER — PROCHLORPERAZINE EDISYLATE 10 MG/2ML IJ SOLN
10.0000 mg | Freq: Four times a day (QID) | INTRAMUSCULAR | Status: DC | PRN
  Administered 2023-03-01 – 2023-03-02 (×3): 10 mg via INTRAVENOUS
  Filled 2023-03-01 (×3): qty 2

## 2023-03-01 MED ORDER — ONDANSETRON HCL 4 MG/2ML IJ SOLN: 4.0000 mg | Freq: Once | INTRAMUSCULAR | Status: AC

## 2023-03-01 NOTE — Anesthesia Preprocedure Evaluation (Signed)
Anesthesia Evaluation  Patient identified by MRN, date of birth, ID band Patient awake    Reviewed: Allergy & Precautions, H&P , NPO status , Patient's Chart, lab work & pertinent test results, reviewed documented beta blocker date and time   Airway Mallampati: II  TM Distance: >3 FB Neck ROM: full    Dental no notable dental hx. (+) Dental Advisory Given, Teeth Intact   Pulmonary neg pulmonary ROS, shortness of breath and with exertion, pneumonia   Pulmonary exam normal breath sounds clear to auscultation       Cardiovascular Exercise Tolerance: Good METS (wheelchair bound): hypertension, Pt. on medications and Pt. on home beta blockers + CAD and +CHF  negative cardio ROS Normal cardiovascular exam Rhythm:regular Rate:Normal  1. Left ventricular ejection fraction, by estimation, is 60 to 65%. The  left ventricle has normal function. The left ventricle has no regional  wall motion abnormalities. Left ventricular diastolic parameters are  consistent with Grade I diastolic  dysfunction (impaired relaxation).   2. Right ventricular systolic function is normal. The right ventricular  size is normal.   3. Left atrial size was moderately dilated.   4. Right atrial size was mildly dilated.   5. The mitral valve is normal in structure. Trivial mitral valve  regurgitation. No evidence of mitral stenosis. Moderate mitral annular  calcification.   6. The aortic valve is tricuspid. There is moderate calcification of the  aortic valve. Aortic valve regurgitation is not visualized. Mild aortic  valve stenosis. Aortic valve area, by VTI measures 1.95 cm. Aortic valve  mean gradient measures 7.0 mmHg.  Aortic valve Vmax measures 1.69 m/s.   7. The inferior vena cava is normal in size with greater than 50%  respiratory variability, suggesting right atrial pressure of 3 mmHg.   Comparison(s): EF 60-65%.     Neuro/Psych  Headaches  PSYCHIATRIC DISORDERS Anxiety Depression     Neuromuscular disease CVA negative neurological ROS  negative psych ROS   GI/Hepatic negative GI ROS, Neg liver ROS, hiatal hernia, PUD,GERD  Medicated,,  Endo/Other  negative endocrine ROSdiabetes, Well Controlled, Type 2, Insulin Dependent    Renal/GU Renal diseasenegative Renal ROS  negative genitourinary   Musculoskeletal  (+) Arthritis , Osteoarthritis,    Abdominal   Peds negative pediatric ROS (+)  Hematology negative hematology ROS (+) Blood dyscrasia, anemia   Anesthesia Other Findings   Reproductive/Obstetrics negative OB ROS                              Anesthesia Physical Anesthesia Plan  ASA: 2 and emergent  Anesthesia Plan: General   Post-op Pain Management: Minimal or no pain anticipated   Induction: Intravenous  PONV Risk Score and Plan: Propofol infusion  Airway Management Planned: Nasal Cannula and Natural Airway  Additional Equipment:   Intra-op Plan:   Post-operative Plan: Possible Post-op intubation/ventilation  Informed Consent: I have reviewed the patients History and Physical, chart, labs and discussed the procedure including the risks, benefits and alternatives for the proposed anesthesia with the patient or authorized representative who has indicated his/her understanding and acceptance.     Dental Advisory Given  Plan Discussed with: CRNA  Anesthesia Plan Comments:          Anesthesia Quick Evaluation

## 2023-03-01 NOTE — Transfer of Care (Signed)
Immediate Anesthesia Transfer of Care Note  Patient: Heather [name]  Procedure(s) Performed: COLONOSCOPY WITH PROPOFOL  Patient Location: PACU  Anesthesia Type:General  Level of Consciousness: awake and patient cooperative  Airway & Oxygen Therapy: Patient Spontanous Breathing  Post-op Assessment: Report given to RN and Post -op Vital signs reviewed and stable  Post vital signs: Reviewed and stable  Last Vitals:  Vitals Value Taken Time  BP 109/98 03/01/23 1523  Temp 97.9 03/01/23   1524  Pulse 86 03/01/23  1524  Resp 11 03/01/23 1524  SpO2 100 03/01/23  1524  Vitals shown include unvalidated device data.  Last Pain:  Vitals:   03/01/23 1442  TempSrc:   PainSc: 10-Worst pain ever      Patients Stated Pain Goal: 0 (03/01/23 1334)  Complications: No notable events documented.

## 2023-03-01 NOTE — Anesthesia Procedure Notes (Signed)
Date/Time: 03/01/2023 2:42 PM  Performed by: Ronny Bacon, RNPre-anesthesia Checklist: Patient identified, Emergency Drugs available, Suction available and Patient being monitored Oxygen Delivery Method: Nasal cannula Placement Confirmation: positive ETCO2

## 2023-03-01 NOTE — Op Note (Signed)
[facility] Patient Name: Heather [name] Procedure Date: 03/01/2023 2:23 PM MRN: [id number] Date of Birth: 12-11-1950 Attending MD: Katrinka Blazing , , [id number] CSN: [id number] Age: 72 Admit Type: Inpatient Procedure:                Colonoscopy Indications:              Rectal bleeding, Crohn's disease of the small bowel Providers:                Katrinka Blazing, Sheran Fava, Tammy Vaught,                            RN, Kristine L. Jessee Avers, Technician Referring MD:              Medicines:                Monitored Anesthesia Care Complications:            No immediate complications. Estimated Blood Loss:     Estimated blood loss: none. Procedure:                Pre-Anesthesia Assessment:                           - Prior to the procedure, a History and Physical                            was performed, and patient medications, allergies                            and sensitivities were reviewed. The patient's                            tolerance of previous anesthesia was reviewed.                           - The risks and benefits of the procedure and the                            sedation options and risks were discussed with the                            patient. All questions were answered and informed                            consent was obtained.                           - ASA Grade Assessment: III - A patient with severe                            systemic disease.                           After obtaining informed consent, the colonoscope                            was passed under  direct vision. Throughout the                            procedure, the patient's blood pressure, pulse, and                            oxygen saturations were monitored continuously. The                            [id number]-[id number] ([id number]) scope was introduced through                            the anus and advanced to the the terminal ileum.                            The  colonoscopy was performed without difficulty.                            The patient tolerated the procedure well. The                            quality of the bowel preparation was excellent. Scope In: 2:53:01 PM Scope Out: 3:15:11 PM Scope Withdrawal Time: 0 hours 9 minutes 54 seconds  Total Procedure Duration: 0 hours 22 minutes 10 seconds  Findings:      The perianal exam findings include superficial pressure ulcers.      The terminal ileum appeared normal.      A few small-mouthed diverticula were found in the sigmoid colon. Upon       careful inspection, no presence of inflammation hematin or active       bleeding was present thorughout the colon.      The retroflexed view of the distal rectum and anal verge was normal and       showed no anal or rectal abnormalities. Impression:               - Superficial pressure ulcers. found on perianal                            exam.                           - The examined portion of the ileum was normal.                           - Diverticulosis in the sigmoid colon.                           - The distal rectum and anal verge are normal on                            retroflexion view.                           - No specimens collected. Moderate Sedation:      Per Anesthesia Care Recommendation:           -  Return patient to hospital ward for ongoing care.                           - Full liquid diet today.                           - C/w pantoprazole 40 mg twice a day.                           - If recurrent rectal bleeding or significant drop                            in hemoglobin, please order STAT CT Angio bleed                            protocol.                           - If negative CT, will proceed with capsule                            endoscopy. Procedure Code(s):        --- Professional ---                           [phone], Colonoscopy, flexible; diagnostic, including                            collection of  specimen(s) by brushing or washing,                            when performed (separate procedure) Diagnosis Code(s):        --- Professional ---                           K50.011, Crohn's disease of small intestine with                            rectal bleeding                           K62.5, Hemorrhage of anus and rectum                           K57.30, Diverticulosis of large intestine without                            perforation or abscess without bleeding CPT copyright 2022 American Medical Association. All rights reserved. The codes documented in this report are preliminary and upon coder review may  be revised to meet current compliance requirements. Katrinka Blazing, MD Katrinka Blazing,  03/01/2023 3:22:08 PM This report has been signed electronically. Number of Addenda: 0

## 2023-03-01 NOTE — Brief Op Note (Signed)
03/01/2023  3:20 PM  PATIENT:  Heather [name]  72 y.o. female  PRE-OPERATIVE DIAGNOSIS:  crohn's disease, rectal bleeding, anemia  POST-OPERATIVE DIAGNOSIS:  diverticulosis  PROCEDURE:  Procedure(s) with comments: COLONOSCOPY WITH PROPOFOL (N/A) - ileocolonoscopy  SURGEON:  Surgeon(s) and Role:    * Dolores Frame, MD - Primary  Patient underwent colonoscopy under propofol sedation.  Tolerated the procedure adequately.   FINDINGS: - Superficial pressure ulcers. found on perianal exam.  - The examined portion of the ileum was normal.  - Diverticulosis in the sigmoid colon.  - The distal rectum and anal verge are normal on retroflexion view.   Note: Upon careful inspection, no presence of inflammation hematin or active bleeding was present thorughout the colon.  RECOMMENDATIONS - Return patient to hospital ward for ongoing care.  - Full liquid diet today.  - C/w pantoprazole 40 mg twice a day. - If recurrent rectal bleeding or significant drop in hemoglobin, please order STAT CT Angio bleed protocol. - If negative CT, will proceed with capsule endoscopy.  Katrinka Blazing, MD Gastroenterology and Hepatology [facility] Gastroenterology

## 2023-03-01 NOTE — TOC Initial Note (Signed)
Transition of Care [facility]) - Initial/Assessment Note    Patient Details  Name: Heather [name] MRN: [id number] Date of Birth: Jul 01, 1951  Transition of Care [facility]) CM/SW Contact:    Villa Herb, LCSWA Phone Number: 03/01/2023, 1:25 PM  Clinical Narrative:                 Pt is high risk for readmission. Pt is well known to TOC. Pt D/C from hospital on 4/30. CSW spoke with pt who confirms plan for return home with [facility] through [place]. Pt lives with her husband and has needed DME in the home. HH resumption orders will be needed prior to D/C. TOC to follow.   Expected Discharge Plan: Home w Home Health Services Barriers to Discharge: Continued Medical Work up   Patient Goals and CMS Choice Patient states their goals for this hospitalization and ongoing recovery are:: return home CMS Medicare.gov Compare Post Acute Care list provided to:: Patient Represenative (must comment) Choice offered to / list presented to : Spouse      Expected Discharge Plan and Services In-house Referral: Clinical Social Work Discharge Planning Services: CM Consult Post Acute Care Choice: Resumption of Svcs/PTA Provider Living arrangements for the past 2 months: Single Family Home                                      Prior Living Arrangements/Services Living arrangements for the past 2 months: Single Family Home Lives with:: Spouse Patient language and need for interpreter reviewed:: Yes Do you feel safe going back to the place where you live?: Yes      Need for Family Participation in Patient Care: Yes (Comment) Care giver support system in place?: Yes (comment) Current home services: DME, Home OT, Home PT, Home RN Criminal Activity/Legal Involvement Pertinent to Current Situation/Hospitalization: No - Comment as needed  Activities of Daily Living Home Assistive Devices/Equipment: Wheelchair, Environmental consultant (specify type) ADL Screening (condition at time of admission) Patient's cognitive ability  adequate to safely complete daily activities?: Yes Is the patient deaf or have difficulty hearing?: No Does the patient have difficulty seeing, even when wearing glasses/contacts?: No Does the patient have difficulty concentrating, remembering, or making decisions?: No Patient able to express need for assistance with ADLs?: Yes Does the patient have difficulty dressing or bathing?: No Independently performs ADLs?: Yes (appropriate for developmental age) Does the patient have difficulty walking or climbing stairs?: Yes Weakness of Legs: Both Weakness of Arms/Hands: None  Permission Sought/Granted                  Emotional Assessment Appearance:: Appears stated age Attitude/Demeanor/Rapport: Engaged     Alcohol / Substance Use: Not Applicable Psych Involvement: No (comment)  Admission diagnosis:  GI bleed [K92.2] Acute GI bleeding [K92.2] Patient Active Problem List   Diagnosis Date Noted   Acute GI bleeding 02/28/2023   HTN (hypertension) 02/26/2023   PUD (peptic ulcer disease) 02/20/2023   Rectal bleeding 02/19/2023   ABLA (acute blood loss anemia) 02/19/2023   GI bleed 02/17/2023   Pancreatic lesion 02/17/2023   Proctocolitis 02/17/2023   Hypoalbuminemia 02/17/2023   Hyperosmolar hyperglycemic state (HHS) (HCC) 02/05/2023   High anion gap metabolic acidosis 02/05/2023   Poor intravenous access 02/05/2023   Fracture 12/11/2022   Pseudohyponatremia 11/12/2022   Thrombocytosis 11/12/2022   DKA (diabetic ketoacidosis) (HCC) 11/12/2022   Uncontrolled type 2 diabetes mellitus with hyperglycemia (HCC)  11/11/2022   DKA, type 2 (HCC) 10/16/2022   History of right shoulder fracture 10/15/2022   Aortic atherosclerosis (HCC) 08/01/2022   Recurrent falls 07/04/2022   Humeral head fracture 07/04/2022   Right leg weakness 06/20/2022   Anxiety 06/20/2022   UTI (urinary tract infection) 06/20/2022   Malnutrition of moderate degree (HCC) 06/20/2022   Hyperkalemia 06/19/2022    Type 2 diabetes mellitus with hyperosmolar nonketotic hyperglycemia (HCC) 05/27/2022   Acute kidney failure (HCC) 03/17/2022   Mixed diabetic hyperlipidemia associated with type 2 diabetes mellitus (HCC) 03/16/2022   Chronic diastolic CHF (congestive heart failure) (HCC) 03/16/2022   Decubitus ulcer of left buttock, stage 2 (HCC) 03/16/2022   Cellulitis of left hand 11/30/2021   Closed fracture of part of upper end of humerus 11/30/2021   At risk for falls 11/28/2021   Neoplasm of meninges 11/28/2021   Sepsis (HCC) 11/28/2021   Acute on chronic anemia 11/28/2021   Cerebral thrombosis with cerebral infarction (HCC) 08/28/2021   Cerebrovascular accident (CVA) due to embolism of cerebral artery (HCC)    Pressure injury of skin 08/26/2021   Acute respiratory failure with hypoxia (HCC) 08/25/2021   Acute metabolic encephalopathy 08/25/2021   Malignant hypertension 08/25/2021   Thrombocytopenia (HCC) 08/25/2021   Anemia due to chronic kidney disease 08/25/2021   Blurry vision, bilateral 08/16/2021   Hyponatremia 04/01/2021   COVID-19 03/30/2021   Senile osteoporosis 02/10/2021   Macrocytic anemia    E coli enteritis 01/09/2021   Acute pancreatitis 01/08/2021   Acute renal failure superimposed on stage 4 chronic kidney disease (HCC) 01/07/2021   Diarrhea    Acute hypokalemia 01/06/2021   Long-term use of immunosuppressant medication-anticipated 11/28/2020   Elevated blood-pressure reading, without diagnosis of hypertension 05/30/2020   Meningioma (HCC) 05/30/2020   Headache 05/17/2020   Hypokalemia 05/17/2020   Hypomagnesemia 05/17/2020   Dehydration 05/17/2020   Hypertensive urgency 05/17/2020   Visual disturbance 05/17/2020   Left hip pain 09/22/2019   Lumbar radiculopathy 09/22/2019   Postoperative anemia due to acute blood loss 09/18/2019   Postoperative urinary retention 09/18/2019   Closed fracture of distal end of radius 05/26/2019   Coronary arteriosclerosis 07/15/2018    Closed right hip fracture (HCC) 04/08/2018   Elevated lipase    Crohn's disease of small intestine without complication (HCC)    Protein-calorie malnutrition, severe (HCC) 03/05/2018   Femur fracture, right (HCC) 03/30/2017   Neck pain 03/12/2017   Hematochezia 01/21/2016   Neuropathy 03/02/2015   Pain in the chest    Chest pain 12/22/2014   Essential hypertension 12/22/2014   Nausea & vomiting 12/22/2014   Uncontrolled type 2 diabetes mellitus with hyperglycemia, with long-term current use of insulin (HCC) 12/22/2014   Hiatal hernia 12/22/2014   GERD (gastroesophageal reflux disease) 12/22/2014   Gout 12/22/2014   Mixed hyperlipidemia 12/22/2014   Depression 12/22/2014   Diabetes mellitus (HCC) 12/22/2014   Fusion of spine, cervicothoracic region 11/01/2014   Pseudophakia, left eye 10/20/2014   Chronic kidney disease (CKD), stage IV (severe) (HCC) 08/25/2014   Cervical stenosis of spine 08/26/2013   Vitamin B12 deficiency 07/08/2011   Personal history of failed moderate sedation 04/16/2011   Chronic Nausea and Vomiting - ? gastroapresis 04/16/2011   Early satiety 04/16/2011   Irritable bowel syndrome 04/16/2011   Benign paroxysmal positional vertigo 11/20/2010   TRICUSPID REGURGITATION, MODERATE 11/20/2010   PULMONARY HYPERTENSION, MILD 11/20/2010   LEG EDEMA, BILATERAL 11/20/2010   DYSPNEA 11/20/2010   Prolonged QT interval 11/20/2010  CHEST WALL PAIN, HX OF 11/20/2010   PCP:  [name], MD Pharmacy:   [facility] [address] [place] - [address] [place] [address] Phone: [phone] Fax: [phone]  [facility] [address], [place] - [address] [place] [address] Phone: [phone] Fax: [phone]     Social Determinants of Health (SDOH) Social History: SDOH Screenings   Food Insecurity: No Food Insecurity (02/17/2023)  Housing: Low Risk  (02/17/2023)  Transportation Needs: No Transportation  Needs (02/17/2023)  Utilities: Not At Risk (02/17/2023)  Tobacco Use: Low Risk  (02/28/2023)   SDOH Interventions:     Readmission Risk Interventions    03/01/2023    1:24 PM 02/06/2023    8:28 AM 11/14/2022   11:31 AM  Readmission Risk Prevention Plan  Transportation Screening Complete Complete   Medication Review Oceanographer) Complete Complete   PCP or Specialist appointment within 3-5 days of discharge   Complete  HRI or Home Care Consult Complete Complete   SW Recovery Care/Counseling Consult Complete Complete   Palliative Care Screening Not Applicable Not Applicable   Skilled Nursing Facility Not Applicable Not Applicable

## 2023-03-01 NOTE — Progress Notes (Signed)
We will proceed with colonoscopy as scheduled.  I thoroughly discussed with the patient the procedure, including the risks involved. Patient understands what the procedure involves including the benefits and any risks. Patient understands alternatives to the proposed procedure. Risks including (but not limited to) bleeding, tearing of the lining (perforation), rupture of adjacent organs, problems with heart and lung function, infection, and medication reactions. A small percentage of complications may require surgery, hospitalization, repeat endoscopic procedure, and/or transfusion.  Patient understood and agreed.  Heather Bensinger Castaneda, MD Gastroenterology and Hepatology [facility]  

## 2023-03-01 NOTE — Progress Notes (Signed)
PROGRESS NOTE     Heather [name], is a 72 y.o. female, DOB - 1951/02/26, ZOX:[id number]  Admit date - 02/26/2023   Admitting Physician [name] [name], MD  Outpatient Primary MD for the patient is [name], MD  LOS - 1  Chief Complaint  Patient presents with   Rectal Bleeding        Brief Narrative:  72 y.o. female with medical history significant for GI bleed, CKD 4, CVA, D CHF,  DM, HTN with history of peptic ulcer disease with recent bleeding from duodenal ulcer,, small bowel Crohn's disease admitted with small-volume bright red blood per rectum on 02/26/2023    -Assessment and Plan: 1)Acute GI bleed -Patient's hemoglobin was 3.2 on 02/17/2023--patient required transfusion of PRBC and endoscopy x 2 during that hospitalization -Discharge Hgb on 02/25/2023 was 9.6 -Hemoglobin currently around 9  S/p recent  EGD and flex sig 4/23 and repeat EGD 4/26 due to persistent bleeding Initial EGD from 02/19/2023 showed esophagitis with ulcers as well extensive duodenal ulceration on initial EGD.  Visible vessel treated with epinephrine and hemostasis clip.  She developed recurrent bleeding  - underwent a repeat EGD on 02/22/23 which revealed a visible vessel at a different site in the ulcer crater requiring epinephrine and clip placement.   -H. pylori negative - Limited FS on 02/22/2023 was with  poor prep nondiagnostic. -GI consult appreciated -Patient has received 2 units of PRBC this admission so far 03/01/23 -Colonoscopy on 03/01/2023 shows no presence of inflammation hematin or active bleeding was present thorughout the colon.  -Endoscopist is advised CT GI bleed protocol if further drop in hemoglobin patient may need capsule endoscopy as well -c/n IV Protonix and Carafate -Hgb is 11 after transfusion of 2 units of packed blood cells on 02/28/2023 which appears high for patient's repeat in a.m.  2)Chronic diastolic CHF (congestive heart failure) (HCC) Stable and compensated.  Last echo  11/2022, EF of 60 to 65%, grade 1 DD.  Not on diuretic. -Monitor closely  3)Diabetes mellitus (HCC) -A1c 6.4 reflecting good diabetic control PTA -Hold Lantus until oral intake is more reliable Use Novolog/Humalog Sliding scale insulin with Accu-Cheks/Fingersticks as ordered   4)HTN Stable, reduce metoprolol to 12.5 mg twice daily   5)Crohn's disease of small intestine without complication (HCC) Stable.  Previously treated with Humira currently not on medications for this -Recent biopsy with nondiagnostic  6) CKD stage -IV -Renal function currently close to baseline -Renally adjust medications, avoid nephrotoxic agents / dehydration  / hypotension  Status is: Inpatient   Disposition: The patient is from: Home              Anticipated d/c is to: Home              Anticipated d/c date is: 2 days              Patient currently is not medically stable to d/c. Barriers: Not Clinically Stable-   Code Status :  -  Code Status: Full Code   Family Communication:    NA (patient is alert, awake and coherent)   DVT Prophylaxis  :   - SCDs  SCDs Start: 02/26/23 2036   Lab Results  Component Value Date   PLT 140 (L) 03/01/2023   Inpatient Medications  Scheduled Meds:  allopurinol  100 mg Oral Daily   Chlorhexidine Gluconate Cloth  6 each Topical Daily   insulin aspart  0-15 Units Subcutaneous Q4H   levETIRAcetam  500 mg Oral BID  metoprolol tartrate  12.5 mg Oral BID   pantoprazole (PROTONIX) IV  40 mg Intravenous Q12H   sodium bicarbonate  650 mg Oral Daily   sodium chloride flush  10-40 mL Intracatheter Q12H   sucralfate  1 g Oral TID WC & HS   Continuous Infusions:   PRN Meds:.acetaminophen **OR** acetaminophen, polyethylene glycol, prochlorperazine, sodium chloride flush   Anti-infectives (From admission, onward)    Start     Dose/Rate Route Frequency Ordered Stop   02/26/23 2200  cefdinir (OMNICEF) capsule 300 mg  Status:  Discontinued        300 mg Oral 2 times  daily 02/26/23 2038 02/26/23 2041      Subjective: Amyna Kowall today has no fevers, no emesis,  No chest pain,   - =-Tolerated colonoscopy well on 03/01/2023 --tolerating liquid diet  Objective: Vitals:   03/01/23 1523 03/01/23 1530 03/01/23 1538 03/01/23 1540  BP: (!) 109/98 (!) 130/102 (!) 151/121 130/61  Pulse: 89 82 83 84  Resp: 10 11 13 16   Temp: 97.9 F (36.6 C)   97.9 F (36.6 C)  TempSrc:      SpO2: 100% 100% 100% 100%  Weight:      Height:        Intake/Output Summary (Last 24 hours) at 03/01/2023 1948 Last data filed at 03/01/2023 1840 Gross per 24 hour  Intake 940 ml  Output --  Net 940 ml   Filed Weights   02/26/23 1434 02/28/23 1307  Weight: 55.9 kg 56 kg   Physical Exam Gen:- Awake Alert,  in no apparent distress  HEENT:- [place].AT, No sclera icterus Neck-Supple Neck,No JVD,.  Lungs-  CTAB , fair symmetrical air movement CV- S1, S2 normal, regular  Abd-  +ve B.Sounds, Abd Soft, mild epi gastric tenderness without rebound or guarding Extremity/Skin:- No  edema, pedal pulses present  Psych-affect is appropriate, oriented x3 Neuro-generalized weakness, no new focal deficits, no tremors  Data Reviewed: I have personally reviewed following labs and imaging studies  CBC: Recent Labs  Lab 02/25/23 0946 02/26/23 1511 02/27/23 0509 02/28/23 0456 02/28/23 0654 03/01/23 0503 03/01/23 1558  WBC 9.2 8.5 8.1 7.2  --  8.2  --   NEUTROABS  --  7.1  --   --   --   --   --   HGB 9.6* 9.0* 9.0* 6.7* 6.5* 11.0* 10.8*  HCT 29.6* 27.3* 29.0* 20.6* 19.9* 33.2* 32.2*  MCV 96.7 94.8 99.7 95.8  --  92.7  --   PLT 196 171 155 149*  --  140*  --    Basic Metabolic Panel: Recent Labs  Lab 02/24/23 0452 02/25/23 0602 02/26/23 1511 02/28/23 0456 03/01/23 0503  NA 137 139 132* 138 137  K 3.9 3.6 3.7 3.2* 4.9  CL 110 111 107 111 113*  CO2 19* 17* 17* 19* 15*  GLUCOSE 79 208* 328* 101* 139*  BUN 82* 78* 69* 70* 60*  CREATININE 2.33* 2.43* 2.22* 2.21* 2.00*   CALCIUM 8.1* 8.0* 7.9* 8.0* 7.7*   GFR: Estimated Creatinine Clearance: 22.6 mL/min (A) (by C-G formula based on SCr of 2 mg/dL (H)). Liver Function Tests: Recent Labs  Lab 02/26/23 1511  AST 14*  ALT 9  ALKPHOS 78  BILITOT 0.7  PROT 4.3*  ALBUMIN 1.8*   HbA1C: No results for input(s): "HGBA1C" in the last 72 hours.  Recent Results (from the past 240 hour(s))  Urine Culture (for pregnant, neutropenic or urologic patients or patients with an indwelling urinary  catheter)     Status: Abnormal   Collection Time: 02/20/23  3:20 PM   Specimen: Urine, Clean Catch  Result Value Ref Range Status   Specimen Description   Final    URINE, CLEAN CATCH Performed at [facility], [address], [place], [place] [address]    Special Requests   Final    NONE Performed at [facility], [address] [place], [place] [address]    Culture (A)  Final    <10,000 COLONIES/mL INSIGNIFICANT GROWTH Performed at [facility], [address], [place], [place] [address]    Report Status 02/21/2023 FINAL  Final    Radiology Studies: No results found.  Scheduled Meds:  allopurinol  100 mg Oral Daily   Chlorhexidine Gluconate Cloth  6 each Topical Daily   insulin aspart  0-15 Units Subcutaneous Q4H   levETIRAcetam  500 mg Oral BID   metoprolol tartrate  12.5 mg Oral BID   pantoprazole (PROTONIX) IV  40 mg Intravenous Q12H   sodium bicarbonate  650 mg Oral Daily   sodium chloride flush  10-40 mL Intracatheter Q12H   sucralfate  1 g Oral TID WC & HS   Continuous Infusions:   LOS: 1 day   Shon Hale M.D on 03/01/2023 at 7:48 PM  Go to [email or web address] - for contact info  [facility] - Office  [phone]  If 7PM-7AM, please contact night-coverage [email or web address] 03/01/2023, 7:48 PM

## 2023-03-01 NOTE — Plan of Care (Signed)

## 2023-03-02 DIAGNOSIS — K264 Chronic or unspecified duodenal ulcer with hemorrhage: Secondary | ICD-10-CM | POA: Diagnosis not present

## 2023-03-02 DIAGNOSIS — K209 Esophagitis, unspecified without bleeding: Secondary | ICD-10-CM

## 2023-03-02 DIAGNOSIS — K5730 Diverticulosis of large intestine without perforation or abscess without bleeding: Secondary | ICD-10-CM | POA: Diagnosis not present

## 2023-03-02 DIAGNOSIS — I10 Essential (primary) hypertension: Secondary | ICD-10-CM | POA: Diagnosis not present

## 2023-03-02 DIAGNOSIS — K269 Duodenal ulcer, unspecified as acute or chronic, without hemorrhage or perforation: Secondary | ICD-10-CM | POA: Diagnosis not present

## 2023-03-02 DIAGNOSIS — N184 Chronic kidney disease, stage 4 (severe): Secondary | ICD-10-CM | POA: Diagnosis not present

## 2023-03-02 LAB — GLUCOSE, CAPILLARY
Glucose-Capillary: 119 mg/dL — ABNORMAL HIGH (ref 70–99)
Glucose-Capillary: 150 mg/dL — ABNORMAL HIGH (ref 70–99)
Glucose-Capillary: 167 mg/dL — ABNORMAL HIGH (ref 70–99)
Glucose-Capillary: 261 mg/dL — ABNORMAL HIGH (ref 70–99)
Glucose-Capillary: 87 mg/dL (ref 70–99)
Glucose-Capillary: 99 mg/dL (ref 70–99)

## 2023-03-02 LAB — CBC
HCT: 30.8 % — ABNORMAL LOW (ref 36.0–46.0)
Hemoglobin: 10.2 g/dL — ABNORMAL LOW (ref 12.0–15.0)
MCH: 30.5 pg (ref 26.0–34.0)
MCHC: 33.1 g/dL (ref 30.0–36.0)
MCV: 92.2 fL (ref 80.0–100.0)
Platelets: 136 10*3/uL — ABNORMAL LOW (ref 150–400)
RBC: 3.34 MIL/uL — ABNORMAL LOW (ref 3.87–5.11)
RDW: 15.7 % — ABNORMAL HIGH (ref 11.5–15.5)
WBC: 7.6 10*3/uL (ref 4.0–10.5)
nRBC: 0 % (ref 0.0–0.2)

## 2023-03-02 LAB — BASIC METABOLIC PANEL
Anion gap: 6 (ref 5–15)
BUN: 57 mg/dL — ABNORMAL HIGH (ref 8–23)
CO2: 18 mmol/L — ABNORMAL LOW (ref 22–32)
Calcium: 8.1 mg/dL — ABNORMAL LOW (ref 8.9–10.3)
Chloride: 111 mmol/L (ref 98–111)
Creatinine, Ser: 2.02 mg/dL — ABNORMAL HIGH (ref 0.44–1.00)
GFR, Estimated: 26 mL/min — ABNORMAL LOW (ref >60)
Glucose, Bld: 112 mg/dL — ABNORMAL HIGH (ref 70–99)
Potassium: 4.8 mmol/L (ref 3.5–5.1)
Sodium: 135 mmol/L (ref 135–145)

## 2023-03-02 MED ORDER — MELATONIN 3 MG PO TABS
6.0000 mg | ORAL_TABLET | Freq: Every evening | ORAL | Status: DC | PRN
  Administered 2023-03-02 – 2023-03-08 (×8): 6 mg via ORAL
  Filled 2023-03-02 (×8): qty 2

## 2023-03-02 NOTE — Progress Notes (Signed)
Patient has yelled out many times throughout the shift/night for nurse/nurse tech. Upon arrival to room, pt. Asks to move tv remote, or asks what nurse is doing, or asks can you stay here with me, or what is that. Nurse instructs pt. To use call bell instead of calling out into the hall. Pt. Verbalizes understanding, however, pt. Continues to yells out periodically calling out nurse or nurse tech's name. Pt. Was assisted to [facility] multiple times, offered fluids and nutrition to patient, diversion was also offered multiple times. Patient states that she just wants someone to stay with her. Nurse stayed at bedside to offer emotional support for approx. 30 minutes. Nurse explained to patient that nurse has to check on other patients. Patient verbalized understanding. Nurse fixed television to pt.'s liking and bed left in lowest position, call light within reach, belongings and nutrition within reach. Bed alarm on, fall mats in place. Pt. Instructed and demonstrated how to use call bed if something was needed. Pt. Verbalized and agreed to understanding.   Around 4:30am: Pt. Requesting something to help her sleep. Pt. States she can't sleep. Notified MD.   Around 5:15am: Gave PRN melatonin to help pt. Sleep, per pt. Request.   At 6:08am: pt. Still lying in bed with eyes open, looking at the tv. Pt. Denies any complaints or concerns. Per pt., she is just waiting to fall asleep. Will continue to monitor and report off to the oncoming shift.

## 2023-03-02 NOTE — Progress Notes (Signed)
Heather [name], M.D. Gastroenterology & Hepatology   Interval History:  No acute events overnight. Patient reports feeling okay, denies any nausea, vomiting, fever, chills, hematemesis.  She has not had any bowel movements per nursing staff. Hemoglobin today was 10.2.  BUN slowly going down to 57.  Inpatient Medications:  Current Facility-Administered Medications:    acetaminophen (TYLENOL) tablet 650 mg, 650 mg, Oral, Q6H PRN, 650 mg at 03/02/23 0400 **OR** acetaminophen (TYLENOL) suppository 650 mg, 650 mg, Rectal, Q6H PRN, Emokpae, Ejiroghene E, MD   allopurinol (ZYLOPRIM) tablet 100 mg, 100 mg, Oral, Daily, Emokpae, Ejiroghene E, MD, 100 mg at 03/01/23 0947   Chlorhexidine Gluconate Cloth 2 % PADS 6 each, 6 each, Topical, Daily, Emokpae, [name], MD, 6 each at 03/01/23 0948   insulin aspart (novoLOG) injection 0-15 Units, 0-15 Units, Subcutaneous, Q4H, Emokpae, Ejiroghene E, MD, 2 Units at 03/02/23 0103   levETIRAcetam (KEPPRA) tablet 500 mg, 500 mg, Oral, BID, Emokpae, Ejiroghene E, MD, 500 mg at 03/01/23 2202   melatonin tablet 6 mg, 6 mg, Oral, QHS PRN, Adefeso, Oladapo, DO, 6 mg at 03/02/23 0521   metoprolol tartrate (LOPRESSOR) tablet 12.5 mg, 12.5 mg, Oral, BID, Emokpae, [name], MD, 12.5 mg at 03/01/23 2203   pantoprazole (PROTONIX) injection 40 mg, 40 mg, Intravenous, Q12H, Emokpae, [name], MD, 40 mg at 03/01/23 2202   polyethylene glycol (MIRALAX / GLYCOLAX) packet 17 g, 17 g, Oral, Daily PRN, Emokpae, Ejiroghene E, MD   prochlorperazine (COMPAZINE) injection 10 mg, 10 mg, Intravenous, Q6H PRN, Adefeso, Oladapo, DO, 10 mg at 03/02/23 0400   sodium bicarbonate tablet 650 mg, 650 mg, Oral, Daily, Emokpae, Ejiroghene E, MD, 650 mg at 03/01/23 0947   sodium chloride flush (NS) 0.9 % injection 10-40 mL, 10-40 mL, Intracatheter, PRN, Emokpae, [name], MD   sodium chloride flush (NS) 0.9 % injection 10-40 mL, 10-40 mL, Intracatheter, Q12H, Emokpae, [name], MD, 10 mL at 03/01/23  2203   sucralfate (CARAFATE) 1 GM/10ML suspension 1 g, 1 g, Oral, TID WC & HS, Carlan, Chelsea L, NP, 1 g at 03/01/23 1719   I/O    Intake/Output Summary (Last 24 hours) at 03/02/2023 0752 Last data filed at 03/01/2023 1840 Gross per 24 hour  Intake 420 ml  Output --  Net 420 ml     Physical Exam: Temp:  [97 F (36.1 C)-97.9 F (36.6 C)] 97.5 F (36.4 C) (05/04 0333) Pulse Rate:  [75-89] 77 (05/04 0333) Resp:  [10-18] 18 (05/04 0333) BP: (109-151)/(61-121) 124/69 (05/04 0333) SpO2:  [100 %] 100 % (05/04 0333)  Temp (24hrs), Avg:97.6 F (36.4 C), Min:97 F (36.1 C), Max:97.9 F (36.6 C) GENERAL: The patient is AO x3, in no acute distress. HEENT: Head is normocephalic and atraumatic. EOMI are intact. Mouth is well hydrated and without lesions. NECK: Supple. No masses LUNGS: Clear to auscultation. No presence of rhonchi/wheezing/rales. Adequate chest expansion HEART: RRR, normal s1 and s2. ABDOMEN: Soft, nontender, no guarding, no peritoneal signs, and nondistended. BS +. No masses. EXTREMITIES: Without any cyanosis, clubbing, rash, lesions or edema. NEUROLOGIC: AOx3, no focal motor deficit. SKIN: no jaundice, no rashes  Laboratory Data: CBC:     Component Value Date/Time   WBC 7.6 03/02/2023 0459   RBC 3.34 (L) 03/02/2023 0459   HGB 10.2 (L) 03/02/2023 0459   HGB 9.6 (L) 07/31/2022 1303   HCT 30.8 (L) 03/02/2023 0459   HCT 29.0 (L) 07/31/2022 1303   PLT 136 (L) 03/02/2023 0459   PLT 212 07/31/2022 1303  MCV 92.2 03/02/2023 0459   MCV 96 07/31/2022 1303   MCH 30.5 03/02/2023 0459   MCHC 33.1 03/02/2023 0459   RDW 15.7 (H) 03/02/2023 0459   RDW 13.7 07/31/2022 1303   LYMPHSABS 0.6 (L) 02/26/2023 1511   MONOABS 0.3 02/26/2023 1511   EOSABS 0.2 02/26/2023 1511   BASOSABS 0.1 02/26/2023 1511   COAG:  Lab Results  Component Value Date   INR 1.1 12/11/2022   INR 1.2 08/26/2021   INR 1.3 (H) 08/23/2021    BMP:     Latest Ref Rng & Units 03/02/2023    4:59 AM  03/01/2023    5:03 AM 02/28/2023    4:56 AM  BMP  Glucose 70 - 99 mg/dL 409  811  914   BUN 8 - 23 mg/dL 57  60  70   Creatinine 0.44 - 1.00 mg/dL 7.82  9.56  2.13   Sodium 135 - 145 mmol/L 135  137  138   Potassium 3.5 - 5.1 mmol/L 4.8  4.9  3.2   Chloride 98 - 111 mmol/L 111  113  111   CO2 22 - 32 mmol/L 18  15  19    Calcium 8.9 - 10.3 mg/dL 8.1  7.7  8.0     HEPATIC:     Latest Ref Rng & Units 02/26/2023    3:11 PM 02/17/2023    4:04 PM 02/09/2023    5:41 AM  Hepatic Function  Total Protein 6.5 - 8.1 g/dL 4.3  4.3    Albumin 3.5 - 5.0 g/dL 1.8  1.6  1.5   AST 15 - 41 U/L 14  25    ALT 0 - 44 U/L 9  11    Alk Phosphatase 38 - 126 U/L 78  94    Total Bilirubin 0.3 - 1.2 mg/dL 0.7  0.6      CARDIAC:  Lab Results  Component Value Date   CKTOTAL 49 10/18/2022   TROPONINI 0.04 (HH) 04/03/2017      Imaging: I personally reviewed and interpreted the available labs, imaging and endoscopic files.   Assessment/Plan: 72 y.o. year old female with a history of small bowel Crohn's disease previously on Humira, history of pancreatitis due to hypertriglyceridemia and use of Victoza, CKD, depression, diabetes, GERD, anxiety, IBS, hyperlipidemia, stroke and recent hospitalization due to NSAID induced duodenal ulcers requiring endoscopic treatment, who came back to the hospital after presenting hematochezia and orthostasis.  Patient was initially found to have stable hemoglobin but her repeat hemoglobin was 6.5.  Due to this, the patient was transfused and had adequate response.  Underwent a repeat EGD which showed presence of improvement in her esophagitis as well as improvement of duodenal ulcers without evidence of active bleeding or stigmata of recent bleeding.  Due to this she underwent a colonoscopy yesterday which showed normal terminal ileum, only abnormal for presence of diverticulosis but no active inflammation or evidence of bleeding.  Patient has had recurrent admissions due to severe  gastrointestinal bleeding.  So far, her workup has showed evidence of ulcers in her duodenum as the most likely cause for her severe bleeding.  She has not had any recurrent bleeding since her procedures were performed and her hemoglobin has remained stable.  For now, would recommend continuing with PPI twice daily and Carafate every 6 hours to allow adequate esophageal and duodenal healing.  If she were to have any recurrent clinical bleeding or significant drop in her hemoglobin, may need to evaluate  this with a CT angio bleeding protocol of the abdomen and pelvis.  However, if no source is identified with this test, may consider performing an inpatient capsule endoscopy..  - Repeat CBC qday, transfuse if Hb <7 - Pantoprazole 40 mg q12h IVP Continue with Carafate 1 g every 6 hours - 2 large bore IV lines - Active T/S -Full liquid diet, may advance tomorrow if stable hemoglobin - Avoid NSAIDs - If recurrent rectal bleeding or significant drop in hemoglobin, please order STAT CT Angio bleed protocol. - If negative CT, will proceed with capsule endoscopy.   Heather Blazing, MD Gastroenterology and Hepatology [facility]

## 2023-03-02 NOTE — Progress Notes (Addendum)
PROGRESS NOTE     Heather [name], is a 72 y.o. female, DOB - 05-25-1951, WUJ:[id number]  Admit date - 02/26/2023   Admitting Physician Lennard Capek [name], MD  Outpatient Primary MD for the patient is [name], MD  LOS - 2  Chief Complaint  Patient presents with   Rectal Bleeding        Brief Narrative:  72 y.o. female with medical history significant for GI bleed, CKD 4, CVA, D CHF,  DM, HTN with history of peptic ulcer disease with recent bleeding from duodenal ulcer,, small bowel Crohn's disease admitted with small-volume bright red blood per rectum on 02/26/2023    -Assessment and Plan: 1)Acute GI bleed -Patient's hemoglobin was 3.2 on 02/17/2023--patient required transfusion of PRBC and endoscopy x 2 during that hospitalization -Discharge Hgb on 02/25/2023 was 9.6 -Hemoglobin currently around 9  S/p recent  EGD and flex sig 4/23 and repeat EGD 4/26 due to persistent bleeding Initial EGD from 02/19/2023 showed esophagitis with ulcers as well extensive duodenal ulceration on initial EGD.  Visible vessel treated with epinephrine and hemostasis clip.  She developed recurrent bleeding  - underwent a repeat EGD on 02/22/23 which revealed a visible vessel at a different site in the ulcer crater requiring epinephrine and clip placement.   -H. pylori negative - Limited FS on 02/22/2023 was with  poor prep nondiagnostic. -GI consult appreciated -Patient has received 2 units of PRBC this admission so far 03/02/23 -Colonoscopy on 03/01/2023 shows no presence of inflammation hematin or active bleeding was present thorughout the colon.  -Endoscopist is advised CT GI bleed protocol if further drop in hemoglobin patient may need capsule endoscopy as well -c/n IV Protonix and Carafate -Hgb is 11 after transfusion of 2 units of packed blood cells on 02/28/2023 which appears high for patient's repeat in a.m. -As of 03/02/2023 Hgb drifting down slowly again raising concerns about possible recurrent  bleeding -If Hgb stabilizes may discharge home on 03/03/2023  2)Chronic diastolic CHF (congestive heart failure) (HCC) Stable and compensated.  Last echo 11/2022, EF of 60 to 65%, grade 1 DD.  Not on diuretic. -Monitor closely  3)Diabetes mellitus (HCC) -A1c 6.4 reflecting good diabetic control PTA -Hold Lantus until oral intake is more reliable Use Novolog/Humalog Sliding scale insulin with Accu-Cheks/Fingersticks as ordered   4)HTN Stable, reduce metoprolol to 12.5 mg twice daily   5)Crohn's disease of small intestine without complication (HCC) Stable.  Previously treated with Humira currently not on medications for this -Recent biopsy with nondiagnostic  6) CKD stage -IV -Renal function currently close to baseline -Renally adjust medications, avoid nephrotoxic agents / dehydration  / hypotension  Status is: Inpatient   Disposition: The patient is from: Home              Anticipated d/c is to: Home              Anticipated d/c date is: 1 day              Patient currently is not medically stable to d/c. Barriers: Not Clinically Stable-   Code Status :  -  Code Status: Full Code   Family Communication:     (patient is alert, awake and coherent)  -Patient's husband updated  DVT Prophylaxis  :   - SCDs  SCDs Start: 02/26/23 2036   Lab Results  Component Value Date   PLT 136 (L) 03/02/2023   Inpatient Medications  Scheduled Meds:  allopurinol  100 mg Oral Daily  Chlorhexidine Gluconate Cloth  6 each Topical Daily   insulin aspart  0-15 Units Subcutaneous Q4H   levETIRAcetam  500 mg Oral BID   metoprolol tartrate  12.5 mg Oral BID   pantoprazole (PROTONIX) IV  40 mg Intravenous Q12H   sodium bicarbonate  650 mg Oral Daily   sodium chloride flush  10-40 mL Intracatheter Q12H   sucralfate  1 g Oral TID WC & HS   Continuous Infusions:  PRN Meds:.acetaminophen **OR** acetaminophen, melatonin, polyethylene glycol, prochlorperazine, sodium chloride  flush   Anti-infectives (From admission, onward)    Start     Dose/Rate Route Frequency Ordered Stop   02/26/23 2200  cefdinir (OMNICEF) capsule 300 mg  Status:  Discontinued        300 mg Oral 2 times daily 02/26/23 2038 02/26/23 2041      Subjective: Heather [name] today has no fevers, no emesis,  No chest pain,   - -Tolerating liquid diet -No melena or hematochezia, no hematemesis -As of 03/02/2023 Hgb drifting down slowly again raising concerns about possible recurrent bleeding -If Hgb stabilizes may discharge home on 03/03/2023  Objective: Vitals:   03/01/23 1540 03/01/23 2019 03/02/23 0333 03/02/23 1521  BP: 130/61 (!) 148/74 124/69 (!) 133/58  Pulse: 84 80 77 81  Resp: 16 16 18 16   Temp: 97.9 F (36.6 C) (!) 97 F (36.1 C) (!) 97.5 F (36.4 C) (!) 97.5 F (36.4 C)  TempSrc:  Oral Oral Oral  SpO2: 100% 100% 100% 100%  Weight:      Height:        Intake/Output Summary (Last 24 hours) at 03/02/2023 1812 Last data filed at 03/01/2023 1840 Gross per 24 hour  Intake 120 ml  Output --  Net 120 ml   Filed Weights   02/26/23 1434 02/28/23 1307  Weight: 55.9 kg 56 kg   Physical Exam Gen:- Awake Alert,  in no apparent distress  HEENT:- [place].AT, No sclera icterus Neck-Supple Neck,No JVD,.  Lungs-  CTAB , fair symmetrical air movement CV- S1, S2 normal, regular  Abd-  +ve B.Sounds, Abd Soft, mild epi gastric tenderness without rebound or guarding Extremity/Skin:- No  edema, pedal pulses present  Psych-affect is appropriate, oriented x3 Neuro-generalized weakness, no new focal deficits, no tremors MSK-left PICC line site with some bruising noted  Data Reviewed: I have personally reviewed following labs and imaging studies  CBC: Recent Labs  Lab 02/26/23 1511 02/27/23 0509 02/28/23 0456 02/28/23 0654 03/01/23 0503 03/01/23 1558 03/02/23 0459  WBC 8.5 8.1 7.2  --  8.2  --  7.6  NEUTROABS 7.1  --   --   --   --   --   --   HGB 9.0* 9.0* 6.7* 6.5* 11.0* 10.8*  10.2*  HCT 27.3* 29.0* 20.6* 19.9* 33.2* 32.2* 30.8*  MCV 94.8 99.7 95.8  --  92.7  --  92.2  PLT 171 155 149*  --  140*  --  136*   Basic Metabolic Panel: Recent Labs  Lab 02/25/23 0602 02/26/23 1511 02/28/23 0456 03/01/23 0503 03/02/23 0459  NA 139 132* 138 137 135  K 3.6 3.7 3.2* 4.9 4.8  CL 111 107 111 113* 111  CO2 17* 17* 19* 15* 18*  GLUCOSE 208* 328* 101* 139* 112*  BUN 78* 69* 70* 60* 57*  CREATININE 2.43* 2.22* 2.21* 2.00* 2.02*  CALCIUM 8.0* 7.9* 8.0* 7.7* 8.1*   GFR: Estimated Creatinine Clearance: 22.4 mL/min (A) (by C-G formula based on SCr of  2.02 mg/dL (H)). Liver Function Tests: Recent Labs  Lab 02/26/23 1511  AST 14*  ALT 9  ALKPHOS 78  BILITOT 0.7  PROT 4.3*  ALBUMIN 1.8*   Radiology Studies: No results found.  Scheduled Meds:  allopurinol  100 mg Oral Daily   Chlorhexidine Gluconate Cloth  6 each Topical Daily   insulin aspart  0-15 Units Subcutaneous Q4H   levETIRAcetam  500 mg Oral BID   metoprolol tartrate  12.5 mg Oral BID   pantoprazole (PROTONIX) IV  40 mg Intravenous Q12H   sodium bicarbonate  650 mg Oral Daily   sodium chloride flush  10-40 mL Intracatheter Q12H   sucralfate  1 g Oral TID WC & HS   Continuous Infusions:   LOS: 2 days   Shon Hale M.D on 03/02/2023 at 6:12 PM  Go to [email or web address] - for contact info  [facility] - Office  [phone]  If 7PM-7AM, please contact night-coverage [email or web address] 03/02/2023, 6:12 PM

## 2023-03-03 ENCOUNTER — Inpatient Hospital Stay (HOSPITAL_COMMUNITY): Payer: Medicare PPO

## 2023-03-03 DIAGNOSIS — N184 Chronic kidney disease, stage 4 (severe): Secondary | ICD-10-CM | POA: Diagnosis not present

## 2023-03-03 DIAGNOSIS — K269 Duodenal ulcer, unspecified as acute or chronic, without hemorrhage or perforation: Secondary | ICD-10-CM | POA: Diagnosis not present

## 2023-03-03 DIAGNOSIS — K5730 Diverticulosis of large intestine without perforation or abscess without bleeding: Secondary | ICD-10-CM | POA: Diagnosis not present

## 2023-03-03 DIAGNOSIS — K254 Chronic or unspecified gastric ulcer with hemorrhage: Secondary | ICD-10-CM

## 2023-03-03 DIAGNOSIS — I10 Essential (primary) hypertension: Secondary | ICD-10-CM | POA: Diagnosis not present

## 2023-03-03 LAB — CBC
HCT: 28.4 % — ABNORMAL LOW (ref 36.0–46.0)
Hemoglobin: 9.2 g/dL — ABNORMAL LOW (ref 12.0–15.0)
MCH: 30.5 pg (ref 26.0–34.0)
MCHC: 32.4 g/dL (ref 30.0–36.0)
MCV: 94 fL (ref 80.0–100.0)
Platelets: 134 10*3/uL — ABNORMAL LOW (ref 150–400)
RBC: 3.02 MIL/uL — ABNORMAL LOW (ref 3.87–5.11)
RDW: 15.7 % — ABNORMAL HIGH (ref 11.5–15.5)
WBC: 12.1 10*3/uL — ABNORMAL HIGH (ref 4.0–10.5)
nRBC: 0 % (ref 0.0–0.2)

## 2023-03-03 LAB — GLUCOSE, CAPILLARY
Glucose-Capillary: 105 mg/dL — ABNORMAL HIGH (ref 70–99)
Glucose-Capillary: 162 mg/dL — ABNORMAL HIGH (ref 70–99)
Glucose-Capillary: 258 mg/dL — ABNORMAL HIGH (ref 70–99)
Glucose-Capillary: 280 mg/dL — ABNORMAL HIGH (ref 70–99)
Glucose-Capillary: 59 mg/dL — ABNORMAL LOW (ref 70–99)
Glucose-Capillary: 70 mg/dL (ref 70–99)
Glucose-Capillary: 94 mg/dL (ref 70–99)

## 2023-03-03 MED ORDER — GLUCOSE 40 % PO GEL
ORAL | Status: AC
  Filled 2023-03-03: qty 1.21

## 2023-03-03 MED ORDER — DEXTROSE 50 % IV SOLN
INTRAVENOUS | Status: AC
  Administered 2023-03-03: 25 mL
  Filled 2023-03-03: qty 50

## 2023-03-03 MED ORDER — OXYCODONE HCL 5 MG PO TABS
5.0000 mg | ORAL_TABLET | Freq: Once | ORAL | Status: AC | PRN
  Administered 2023-03-03: 5 mg via ORAL
  Filled 2023-03-03: qty 1

## 2023-03-03 NOTE — Progress Notes (Signed)
PROGRESS NOTE     Heather [name], is a 72 y.o. female, DOB - May 16, 1951, WUJ:[id number]  Admit date - 02/26/2023   Admitting Physician Saburo Luger [name], MD  Outpatient Primary MD for the patient is [name], MD  LOS - 3  Chief Complaint  Patient presents with   Rectal Bleeding        Brief Narrative:  72 y.o. female with medical history significant for GI bleed, CKD 4, CVA, D CHF,  DM, HTN with history of peptic ulcer disease with recent bleeding from duodenal ulcer,, small bowel Crohn's disease admitted with small-volume bright red blood per rectum on 02/26/2023    -Assessment and Plan: 1)Acute GI bleed -Patient's hemoglobin was 3.2 on 02/17/2023--patient required transfusion of PRBC and endoscopy x 2 during that hospitalization -Discharge Hgb on 02/25/2023 was 9.6 -Hemoglobin currently around 9  S/p recent  EGD and flex sig 4/23 and repeat EGD 4/26 due to persistent bleeding Initial EGD from 02/19/2023 showed esophagitis with ulcers as well extensive duodenal ulceration on initial EGD.  Visible vessel treated with epinephrine and hemostasis clip.  She developed recurrent bleeding  - underwent a repeat EGD on 02/22/23 which revealed a visible vessel at a different site in the ulcer crater requiring epinephrine and clip placement.   -H. pylori negative - Limited FS on 02/22/2023 was with  poor prep nondiagnostic. -GI consult appreciated -Patient has received 2 units of PRBC this admission so far -Colonoscopy on 03/01/2023 shows no presence of inflammation hematin or active bleeding was present thorughout the colon.  -Endoscopist is advised CT GI bleed protocol if further drop in hemoglobin patient may need capsule endoscopy as well -c/n IV Protonix and Carafate -Hgb was 11 after transfusion of 2 units of packed blood cells on 02/28/2023 - -03/03/23 -Patient with black stools/melena -Hgb dropped 1 g -Unable to do CT angio GI bleed protocol due to renal function -GI service plans For   capsule endoscopy on 03/04/2023  2)Chronic diastolic CHF (congestive heart failure) (HCC) Stable and compensated.  Last echo 11/2022, EF of 60 to 65%, grade 1 DD.  Not on diuretic. -Monitor closely  3)Diabetes mellitus (HCC) -A1c 6.4 reflecting good diabetic control PTA -Hold Lantus until oral intake is more reliable Use Novolog/Humalog Sliding scale insulin with Accu-Cheks/Fingersticks as ordered   4)HTN Stable, reduce metoprolol to 12.5 mg twice daily   5)Crohn's disease of small intestine without complication (HCC) Stable.  Previously treated with Humira currently not on medications for this -Recent biopsy with nondiagnostic  6) CKD stage -IV -Renal function currently close to baseline -Renally adjust medications, avoid nephrotoxic agents / dehydration  / hypotension  Status is: Inpatient   Disposition: The patient is from: Home              Anticipated d/c is to: Home              Anticipated d/c date is: 1 day              Patient currently is not medically stable to d/c. Barriers: Not Clinically Stable-   Code Status :  -  Code Status: Full Code   Family Communication:     (patient is alert, awake and coherent)  -Patient's husband updated  DVT Prophylaxis  :   - SCDs  SCDs Start: 02/26/23 2036   Lab Results  Component Value Date   PLT 134 (L) 03/03/2023   Inpatient Medications  Scheduled Meds:  allopurinol  100 mg Oral Daily  Chlorhexidine Gluconate Cloth  6 each Topical Daily   insulin aspart  0-15 Units Subcutaneous Q4H   levETIRAcetam  500 mg Oral BID   metoprolol tartrate  12.5 mg Oral BID   pantoprazole (PROTONIX) IV  40 mg Intravenous Q12H   sodium bicarbonate  650 mg Oral Daily   sodium chloride flush  10-40 mL Intracatheter Q12H   sucralfate  1 g Oral TID WC & HS   Continuous Infusions:  PRN Meds:.acetaminophen **OR** acetaminophen, melatonin, polyethylene glycol, prochlorperazine, sodium chloride flush   Anti-infectives (From admission,  onward)    Start     Dose/Rate Route Frequency Ordered Stop   02/26/23 2200  cefdinir (OMNICEF) capsule 300 mg  Status:  Discontinued        300 mg Oral 2 times daily 02/26/23 2038 02/26/23 2041      Subjective: Heather [name] today has no fevers, no emesis,  No chest pain,   - -Tolerating liquid diet -Had melena/black stool and -Hgb drifting down -Denies significant abdominal pain  Objective: Vitals:   03/03/23 0611 03/03/23 0932 03/03/23 0944 03/03/23 1539  BP: 95/60 (!) 102/23 107/75 100/75  Pulse: 89 90 88 93  Resp: 20 14  18   Temp: 97.6 F (36.4 C) 97.6 F (36.4 C)  (!) 97.5 F (36.4 C)  TempSrc: Oral Oral  Oral  SpO2: 100% 100%  100%  Weight:      Height:       No intake or output data in the 24 hours ending 03/03/23 1729  Filed Weights   02/26/23 1434 02/28/23 1307  Weight: 55.9 kg 56 kg   Physical Exam Gen:- Awake Alert,  in no apparent distress  HEENT:- [place].AT, No sclera icterus Neck-Supple Neck,No JVD,.  Lungs-  CTAB , fair symmetrical air movement CV- S1, S2 normal, regular  Abd-  +ve B.Sounds, Abd Soft, overall improved epi gastric tenderness without rebound or guarding Extremity/Skin:- No  edema, pedal pulses present  Psych-affect is appropriate, oriented x3 Neuro-generalized weakness, no new focal deficits, no tremors MSK-left PICC line site with some bruising noted  Data Reviewed: I have personally reviewed following labs and imaging studies  CBC: Recent Labs  Lab 02/26/23 1511 02/27/23 0509 02/28/23 0456 02/28/23 0654 03/01/23 0503 03/01/23 1558 03/02/23 0459 03/03/23 0523  WBC 8.5 8.1 7.2  --  8.2  --  7.6 12.1*  NEUTROABS 7.1  --   --   --   --   --   --   --   HGB 9.0* 9.0* 6.7* 6.5* 11.0* 10.8* 10.2* 9.2*  HCT 27.3* 29.0* 20.6* 19.9* 33.2* 32.2* 30.8* 28.4*  MCV 94.8 99.7 95.8  --  92.7  --  92.2 94.0  PLT 171 155 149*  --  140*  --  136* 134*   Basic Metabolic Panel: Recent Labs  Lab 02/25/23 0602 02/26/23 1511  02/28/23 0456 03/01/23 0503 03/02/23 0459  NA 139 132* 138 137 135  K 3.6 3.7 3.2* 4.9 4.8  CL 111 107 111 113* 111  CO2 17* 17* 19* 15* 18*  GLUCOSE 208* 328* 101* 139* 112*  BUN 78* 69* 70* 60* 57*  CREATININE 2.43* 2.22* 2.21* 2.00* 2.02*  CALCIUM 8.0* 7.9* 8.0* 7.7* 8.1*   GFR: Estimated Creatinine Clearance: 22.4 mL/min (A) (by C-G formula based on SCr of 2.02 mg/dL (H)). Liver Function Tests: Recent Labs  Lab 02/26/23 1511  AST 14*  ALT 9  ALKPHOS 78  BILITOT 0.7  PROT 4.3*  ALBUMIN 1.8*  Radiology Studies: No results found.  Scheduled Meds:  allopurinol  100 mg Oral Daily   Chlorhexidine Gluconate Cloth  6 each Topical Daily   insulin aspart  0-15 Units Subcutaneous Q4H   levETIRAcetam  500 mg Oral BID   metoprolol tartrate  12.5 mg Oral BID   pantoprazole (PROTONIX) IV  40 mg Intravenous Q12H   sodium bicarbonate  650 mg Oral Daily   sodium chloride flush  10-40 mL Intracatheter Q12H   sucralfate  1 g Oral TID WC & HS   Continuous Infusions:   LOS: 3 days   Shon Hale M.D on 03/03/2023 at 5:29 PM  Go to [email or web address] - for contact info  [facility] - Office  [phone]  If 7PM-7AM, please contact night-coverage [email or web address] 03/03/2023, 5:29 PM

## 2023-03-03 NOTE — Progress Notes (Signed)
Informed by radiology department that CTA will not be performed given eGFR. We will proceed with capsule endoscopy tomorrow.

## 2023-03-03 NOTE — Anesthesia Postprocedure Evaluation (Signed)
Anesthesia Post Note  Patient: Heather [name]  Procedure(s) Performed: COLONOSCOPY WITH PROPOFOL  Patient location during evaluation: Phase II Anesthesia Type: General Level of consciousness: awake Pain management: pain level controlled Vital Signs Assessment: post-procedure vital signs reviewed and stable Respiratory status: spontaneous breathing and respiratory function stable Cardiovascular status: blood pressure returned to baseline and stable Postop Assessment: no headache and no apparent nausea or vomiting Anesthetic complications: no Comments: Late entry   No notable events documented.   Last Vitals:  Vitals:   03/03/23 0932 03/03/23 0944  BP: (!) 102/23 107/75  Pulse: 90 88  Resp: 14   Temp: 36.4 C   SpO2: 100%     Last Pain:  Vitals:   03/03/23 0932  TempSrc: Oral  PainSc:                  Windell Norfolk

## 2023-03-03 NOTE — Anesthesia Postprocedure Evaluation (Signed)
Anesthesia Post Note  Patient: Heather [name]  Procedure(s) Performed: ESOPHAGOGASTRODUODENOSCOPY (EGD) WITH PROPOFOL  Patient location during evaluation: Phase II Anesthesia Type: General Level of consciousness: awake Pain management: pain level controlled Vital Signs Assessment: post-procedure vital signs reviewed and stable Respiratory status: spontaneous breathing and respiratory function stable Cardiovascular status: blood pressure returned to baseline and stable Postop Assessment: no headache and no apparent nausea or vomiting Anesthetic complications: no Comments: Late entry   No notable events documented.   Last Vitals:  Vitals:   03/03/23 0611 03/03/23 0932  BP: 95/60 (!) 102/23  Pulse: 89 90  Resp: 20 14  Temp: 36.4 C 36.4 C  SpO2: 100% 100%    Last Pain:  Vitals:   03/03/23 0932  TempSrc: Oral  PainSc:                  Windell Norfolk

## 2023-03-03 NOTE — Progress Notes (Signed)
Heather [name], M.D. Gastroenterology & Hepatology   Interval History:  Patient reports feeling well and denies having any complaint. Per nursing staff, she had 1 episode of melena yesterday.  No nausea, vomiting, fever, chills.  Tolerated diet adequately. Hemoglobin today dropped down to 9.2.  Inpatient Medications:  Current Facility-Administered Medications:    acetaminophen (TYLENOL) tablet 650 mg, 650 mg, Oral, Q6H PRN, 650 mg at 03/03/23 0936 **OR** acetaminophen (TYLENOL) suppository 650 mg, 650 mg, Rectal, Q6H PRN, Emokpae, Ejiroghene E, MD   allopurinol (ZYLOPRIM) tablet 100 mg, 100 mg, Oral, Daily, Emokpae, Ejiroghene E, MD, 100 mg at 03/03/23 0936   Chlorhexidine Gluconate Cloth 2 % PADS 6 each, 6 each, Topical, Daily, Emokpae, [name], MD, 6 each at 03/02/23 1123   insulin aspart (novoLOG) injection 0-15 Units, 0-15 Units, Subcutaneous, Q4H, Emokpae, Ejiroghene E, MD, 8 Units at 03/03/23 0102   levETIRAcetam (KEPPRA) tablet 500 mg, 500 mg, Oral, BID, Emokpae, Ejiroghene E, MD, 500 mg at 03/03/23 0936   melatonin tablet 6 mg, 6 mg, Oral, QHS PRN, Adefeso, Oladapo, DO, 6 mg at 03/02/23 2205   metoprolol tartrate (LOPRESSOR) tablet 12.5 mg, 12.5 mg, Oral, BID, Emokpae, [name], MD, 12.5 mg at 03/03/23 0949   pantoprazole (PROTONIX) injection 40 mg, 40 mg, Intravenous, Q12H, Emokpae, [name], MD, 40 mg at 03/03/23 0937   polyethylene glycol (MIRALAX / GLYCOLAX) packet 17 g, 17 g, Oral, Daily PRN, Emokpae, Ejiroghene E, MD   prochlorperazine (COMPAZINE) injection 10 mg, 10 mg, Intravenous, Q6H PRN, Adefeso, Oladapo, DO, 10 mg at 03/02/23 2203   sodium bicarbonate tablet 650 mg, 650 mg, Oral, Daily, Emokpae, Ejiroghene E, MD, 650 mg at 03/03/23 0936   sodium chloride flush (NS) 0.9 % injection 10-40 mL, 10-40 mL, Intracatheter, PRN, Emokpae, [name], MD   sodium chloride flush (NS) 0.9 % injection 10-40 mL, 10-40 mL, Intracatheter, Q12H, Emokpae, [name], MD, 10 mL at 03/03/23 0938    sucralfate (CARAFATE) 1 GM/10ML suspension 1 g, 1 g, Oral, TID WC & HS, Carlan, Chelsea L, NP, 1 g at 03/03/23 0935   I/O   No intake or output data in the 24 hours ending 03/03/23 1013   Physical Exam: Temp:  [97.5 F (36.4 C)-98.2 F (36.8 C)] 97.6 F (36.4 C) (05/05 0932) Pulse Rate:  [81-90] 88 (05/05 0944) Resp:  [14-20] 14 (05/05 0932) BP: (95-133)/(23-75) 107/75 (05/05 0944) SpO2:  [100 %] 100 % (05/05 0932)  Temp (24hrs), Avg:97.7 F (36.5 C), Min:97.5 F (36.4 C), Max:98.2 F (36.8 C)  GENERAL: The patient is AO x3, in no acute distress. HEENT: Head is normocephalic and atraumatic. EOMI are intact. Mouth is well hydrated and without lesions. NECK: Supple. No masses LUNGS: Clear to auscultation. No presence of rhonchi/wheezing/rales. Adequate chest expansion HEART: RRR, normal s1 and s2. ABDOMEN: Soft, nontender, no guarding, no peritoneal signs, and nondistended. BS +. No masses. EXTREMITIES: Without any cyanosis, clubbing, rash, lesions or edema. NEUROLOGIC: AOx3, no focal motor deficit. SKIN: no jaundice, no rashes  Laboratory Data: CBC:     Component Value Date/Time   WBC 12.1 (H) 03/03/2023 0523   RBC 3.02 (L) 03/03/2023 0523   HGB 9.2 (L) 03/03/2023 0523   HGB 9.6 (L) 07/31/2022 1303   HCT 28.4 (L) 03/03/2023 0523   HCT 29.0 (L) 07/31/2022 1303   PLT 134 (L) 03/03/2023 0523   PLT 212 07/31/2022 1303   MCV 94.0 03/03/2023 0523   MCV 96 07/31/2022 1303   MCH 30.5 03/03/2023 0523   MCHC 32.4  03/03/2023 0523   RDW 15.7 (H) 03/03/2023 0523   RDW 13.7 07/31/2022 1303   LYMPHSABS 0.6 (L) 02/26/2023 1511   MONOABS 0.3 02/26/2023 1511   EOSABS 0.2 02/26/2023 1511   BASOSABS 0.1 02/26/2023 1511   COAG:  Lab Results  Component Value Date   INR 1.1 12/11/2022   INR 1.2 08/26/2021   INR 1.3 (H) 08/23/2021    BMP:     Latest Ref Rng & Units 03/02/2023    4:59 AM 03/01/2023    5:03 AM 02/28/2023    4:56 AM  BMP  Glucose 70 - 99 mg/dL 161  096  045   BUN 8 -  23 mg/dL 57  60  70   Creatinine 0.44 - 1.00 mg/dL 4.09  8.11  9.14   Sodium 135 - 145 mmol/L 135  137  138   Potassium 3.5 - 5.1 mmol/L 4.8  4.9  3.2   Chloride 98 - 111 mmol/L 111  113  111   CO2 22 - 32 mmol/L 18  15  19    Calcium 8.9 - 10.3 mg/dL 8.1  7.7  8.0     HEPATIC:     Latest Ref Rng & Units 02/26/2023    3:11 PM 02/17/2023    4:04 PM 02/09/2023    5:41 AM  Hepatic Function  Total Protein 6.5 - 8.1 g/dL 4.3  4.3    Albumin 3.5 - 5.0 g/dL 1.8  1.6  1.5   AST 15 - 41 U/L 14  25    ALT 0 - 44 U/L 9  11    Alk Phosphatase 38 - 126 U/L 78  94    Total Bilirubin 0.3 - 1.2 mg/dL 0.7  0.6      CARDIAC:  Lab Results  Component Value Date   CKTOTAL 49 10/18/2022   TROPONINI 0.04 (HH) 04/03/2017      Imaging: I personally reviewed and interpreted the available labs, imaging and endoscopic files.   Assessment/Plan: 72 y.o. year old female with a history of small bowel Crohn's disease previously on Humira, history of pancreatitis due to hypertriglyceridemia and use of Victoza, CKD, depression, diabetes, GERD, anxiety, IBS, hyperlipidemia, stroke and recent hospitalization due to NSAID induced duodenal ulcers requiring endoscopic treatment, who came back to the hospital after presenting hematochezia and orthostasis.  Patient was initially found to have stable hemoglobin but her repeat hemoglobin was 6.5.  Due to this, the patient was transfused and had adequate response.  Underwent a repeat EGD which showed presence of improvement in her esophagitis as well as improvement of duodenal ulcers without evidence of active bleeding or stigmata of recent bleeding.  Due to this she underwent a colonoscopy which showed normal terminal ileum, only abnormal for presence of diverticulosis but no active inflammation or evidence of bleeding.   Patient has had recurrent admissions due to severe gastrointestinal bleeding.  So far, her workup has showed evidence of ulcers in her duodenum as the most  likely cause for her severe bleeding.  Unfortunately, she has presented progressive drop in her hemoglobin since admission with some episodes of melena.  Due to this, we will evaluate this further with a CT angio abdomen and pelvis bleeding protocol.  If this is negative, we will need to proceed with a capsule endoscopy tomorrow.  For now, we will continue with PPI twice daily and Carafate every 6 hours to allow adequate esophageal and duodenal healing.    - Repeat CBC qday, transfuse if  Hb <7 - Pantoprazole 40 mg q12h IVP Continue with Carafate 1 g every 6 hours - 2 large bore IV lines - Active T/S -Full liquid diet, may advance tomorrow if stable hemoglobin - Avoid NSAIDs - I proceed with CT Angio abdomen and pelvis bleed protocol. - If negative CT, will proceed with capsule endoscopy.   Heather Blazing, MD Gastroenterology and Hepatology [facility]

## 2023-03-04 ENCOUNTER — Encounter (HOSPITAL_COMMUNITY): Payer: Self-pay | Admitting: Family Medicine

## 2023-03-04 ENCOUNTER — Encounter (HOSPITAL_COMMUNITY)
Admission: EM | Disposition: A | Discharge status: Home / Self Care | Payer: Self-pay | Source: Home / Self Care | Attending: Family Medicine

## 2023-03-04 DIAGNOSIS — K254 Chronic or unspecified gastric ulcer with hemorrhage: Secondary | ICD-10-CM | POA: Diagnosis not present

## 2023-03-04 DIAGNOSIS — D62 Acute posthemorrhagic anemia: Secondary | ICD-10-CM | POA: Diagnosis not present

## 2023-03-04 DIAGNOSIS — K625 Hemorrhage of anus and rectum: Secondary | ICD-10-CM | POA: Diagnosis not present

## 2023-03-04 DIAGNOSIS — I5032 Chronic diastolic (congestive) heart failure: Secondary | ICD-10-CM | POA: Diagnosis not present

## 2023-03-04 DIAGNOSIS — I10 Essential (primary) hypertension: Secondary | ICD-10-CM | POA: Diagnosis not present

## 2023-03-04 HISTORY — PX: GIVENS CAPSULE STUDY: SHX5432

## 2023-03-04 LAB — CBC
HCT: 32.5 % — ABNORMAL LOW (ref 36.0–46.0)
Hemoglobin: 10.6 g/dL — ABNORMAL LOW (ref 12.0–15.0)
MCH: 30.7 pg (ref 26.0–34.0)
MCHC: 32.6 g/dL (ref 30.0–36.0)
MCV: 94.2 fL (ref 80.0–100.0)
Platelets: 177 10*3/uL (ref 150–400)
RBC: 3.45 MIL/uL — ABNORMAL LOW (ref 3.87–5.11)
RDW: 15.6 % — ABNORMAL HIGH (ref 11.5–15.5)
WBC: 22.9 10*3/uL — ABNORMAL HIGH (ref 4.0–10.5)
nRBC: 0 % (ref 0.0–0.2)

## 2023-03-04 LAB — GLUCOSE, CAPILLARY
Glucose-Capillary: 102 mg/dL — ABNORMAL HIGH (ref 70–99)
Glucose-Capillary: 122 mg/dL — ABNORMAL HIGH (ref 70–99)
Glucose-Capillary: 184 mg/dL — ABNORMAL HIGH (ref 70–99)
Glucose-Capillary: 199 mg/dL — ABNORMAL HIGH (ref 70–99)
Glucose-Capillary: 330 mg/dL — ABNORMAL HIGH (ref 70–99)
Glucose-Capillary: 41 mg/dL — CL (ref 70–99)
Glucose-Capillary: 425 mg/dL — ABNORMAL HIGH (ref 70–99)
Glucose-Capillary: 436 mg/dL — ABNORMAL HIGH (ref 70–99)
Glucose-Capillary: 95 mg/dL (ref 70–99)

## 2023-03-04 LAB — COMPREHENSIVE METABOLIC PANEL
ALT: 14 U/L (ref 0–44)
AST: 25 U/L (ref 15–41)
Albumin: 1.9 g/dL — ABNORMAL LOW (ref 3.5–5.0)
Alkaline Phosphatase: 82 U/L (ref 38–126)
Anion gap: 6 (ref 5–15)
BUN: 55 mg/dL — ABNORMAL HIGH (ref 8–23)
CO2: 17 mmol/L — ABNORMAL LOW (ref 22–32)
Calcium: 8.1 mg/dL — ABNORMAL LOW (ref 8.9–10.3)
Chloride: 108 mmol/L (ref 98–111)
Creatinine, Ser: 1.92 mg/dL — ABNORMAL HIGH (ref 0.44–1.00)
GFR, Estimated: 28 mL/min — ABNORMAL LOW (ref >60)
Glucose, Bld: 64 mg/dL — ABNORMAL LOW (ref 70–99)
Potassium: 4.6 mmol/L (ref 3.5–5.1)
Sodium: 131 mmol/L — ABNORMAL LOW (ref 135–145)
Total Bilirubin: 0.5 mg/dL (ref 0.3–1.2)
Total Protein: 4.6 g/dL — ABNORMAL LOW (ref 6.5–8.1)

## 2023-03-04 SURGERY — IMAGING PROCEDURE, GI TRACT, INTRALUMINAL, VIA CAPSULE

## 2023-03-04 MED ORDER — DEXTROSE 50 % IV SOLN
INTRAVENOUS | Status: AC
  Filled 2023-03-04: qty 50

## 2023-03-04 MED ORDER — INSULIN ASPART 100 UNIT/ML IJ SOLN
20.0000 [IU] | Freq: Once | INTRAMUSCULAR | Status: AC
  Administered 2023-03-04: 20 [IU] via SUBCUTANEOUS

## 2023-03-04 MED ORDER — SODIUM BICARBONATE 650 MG PO TABS
650.0000 mg | ORAL_TABLET | Freq: Three times a day (TID) | ORAL | Status: DC
  Administered 2023-03-04 – 2023-03-09 (×14): 650 mg via ORAL
  Filled 2023-03-04 (×14): qty 1

## 2023-03-04 MED ORDER — SODIUM CHLORIDE 0.9 % IV SOLN: INTRAVENOUS | Status: DC

## 2023-03-04 MED ORDER — DEXTROSE 50 % IV SOLN
25.0000 g | INTRAVENOUS | Status: AC
  Administered 2023-03-04: 25 g via INTRAVENOUS

## 2023-03-04 NOTE — Progress Notes (Signed)
Patient was hypoglycemic this morning. CBG was 41 at 0602. Patient was given 1amp of dextrose at 0609. Patients CBG is now 122 at 0610. Patient reports feeling fine. MD notified no new orders obtained.

## 2023-03-04 NOTE — Inpatient Diabetes Management (Signed)
Inpatient Diabetes Program Recommendations  AACE/ADA: New Consensus Statement on Inpatient Glycemic Control (2015)  Target Ranges:  Prepandial:   less than 140 mg/dL      Peak postprandial:   less than 180 mg/dL (1-2 hours)      Critically ill patients:  140 - 180 mg/dL   Lab Results  Component Value Date   GLUCAP 102 (H) 03/04/2023   HGBA1C 5.7 (H) 02/26/2023    Latest Reference Range & Units 03/03/23 06:32 03/03/23 06:59 03/03/23 09:25 03/03/23 12:19 03/03/23 17:24 03/04/23 00:10 03/04/23 06:02 03/04/23 06:40 03/04/23 08:14  Glucose-Capillary 70 - 99 mg/dL 59 (L) A21 94 308 (H) 657 (H) Novolog 3 units 258 (H) Novolog 8 units 199 (H) Novolog 3 units 41 (LL) 122 (H) 102 (H)  (LL): Data is critically low (L): Data is abnormally low (H): Data is abnormally high  Diabetes history: DM2 Outpatient Diabetes medications: Semglee 15 units daily, Novolog 2-5 units TID  Current orders for Inpatient glycemic control: Novolog 0-15 Q4H correction  Inpatient Diabetes Program Recommendations:   Patient had hypoglycemia post Novolog correction Please consider: -Add Semglee 5 units qd -Decrease Novolog correction to 0-6 units q 4 hrs.  Thank you, Billy Fischer. Gwenetta Devos, RN, MSN, CDE  Diabetes Coordinator Inpatient Glycemic Control Team Team Pager [phone] (8am-5pm) 03/04/2023 10:33 AM

## 2023-03-04 NOTE — Progress Notes (Addendum)
PROGRESS NOTE     Heather [name], is a 72 y.o. female, DOB - 14-Jun-1951, ZOX:[id number]  Admit date - 02/26/2023   Admitting Physician Teona Vargus [name], MD  Outpatient Primary MD for the patient is [name], MD  LOS - 4  Chief Complaint  Patient presents with   Rectal Bleeding        Brief Narrative:  72 y.o. female with medical history significant for GI bleed, CKD 4, CVA, D CHF,  DM, HTN with history of peptic ulcer disease with recent bleeding from duodenal ulcer,, small bowel Crohn's disease admitted with small-volume bright red blood per rectum on 02/26/2023    -Assessment and Plan: 1)Acute GI bleed -Patient's hemoglobin was 3.2 on 02/17/2023--patient required transfusion of PRBC and endoscopy x 2 during that hospitalization -Discharge Hgb on 02/25/2023 was 9.6 -Hemoglobin currently around 9  S/p recent  EGD and flex sig 4/23 and repeat EGD 4/26 due to persistent bleeding Initial EGD from 02/19/2023 showed esophagitis with ulcers as well extensive duodenal ulceration on initial EGD.  Visible vessel treated with epinephrine and hemostasis clip.  She developed recurrent bleeding  - underwent a repeat EGD on 02/22/23 which revealed a visible vessel at a different site in the ulcer crater requiring epinephrine and clip placement.   -H. pylori negative - Limited FS on 02/22/2023 was with  poor prep nondiagnostic. -GI consult appreciated -Patient has received 2 units of PRBC this admission so far -Colonoscopy on 03/01/2023 shows no presence of inflammation hematin or active bleeding was present thorughout the colon.  -c/n IV Protonix and Carafate -Hgb was 11 after transfusion of 2 units of packed blood cells on 02/28/2023 - -03/04/23 -Unable to do CT angio GI bleed protocol due to renal function -  capsule endoscopy started on 03/04/2023 around 1 AM -Continue to monitor H&H  2)Chronic diastolic CHF (congestive heart failure) (HCC) Stable and compensated.  Last echo 11/2022, EF of 60  to 65%, grade 1 DD.  Not on diuretic. -Monitor closely  3)Diabetes mellitus (HCC) -A1c 6.4 reflecting good diabetic control PTA -Hold Lantus until oral intake is more reliable Use Novolog/Humalog Sliding scale insulin with Accu-Cheks/Fingersticks as ordered   4)HTN Stable, reduce metoprolol to 12.5 mg twice daily   5)Crohn's disease of small intestine without complication (HCC) Stable.  Previously treated with Humira currently not on medications for this -Recent biopsy during endoscopy was nondiagnostic  6) CKD stage -IV -Renal function currently close to baseline -Creatinine is down to 1.9 from 2.43 on 02/25/2023 -Renally adjust medications, avoid nephrotoxic agents / dehydration  / hypotension  7)Mild Hyponatremia, not POA----suspect dehydration related, monitor closely  Status is: Inpatient   Disposition: The patient is from: Home              Anticipated d/c is to: Home              Anticipated d/c date is: 1 day              Patient currently is not medically stable to d/c. Barriers: Not Clinically Stable-   Code Status :  -  Code Status: Full Code   Family Communication:     (patient is alert, awake and coherent)  -Patient's husband updated  DVT Prophylaxis  :   - SCDs  SCDs Start: 02/26/23 2036   Lab Results  Component Value Date   PLT 177 03/04/2023   Inpatient Medications  Scheduled Meds:  allopurinol  100 mg Oral Daily   Chlorhexidine Gluconate Cloth  6 each Topical Daily   insulin aspart  0-15 Units Subcutaneous Q4H   levETIRAcetam  500 mg Oral BID   metoprolol tartrate  12.5 mg Oral BID   pantoprazole (PROTONIX) IV  40 mg Intravenous Q12H   sodium bicarbonate  650 mg Oral TID   sodium chloride flush  10-40 mL Intracatheter Q12H   sucralfate  1 g Oral TID WC & HS   Continuous Infusions:  PRN Meds:.acetaminophen **OR** acetaminophen, melatonin, polyethylene glycol, prochlorperazine, sodium chloride flush   Anti-infectives (From admission, onward)     Start     Dose/Rate Route Frequency Ordered Stop   02/26/23 2200  cefdinir (OMNICEF) capsule 300 mg  Status:  Discontinued        300 mg Oral 2 times daily 02/26/23 2038 02/26/23 2041      Subjective: Heather [name] today has no fevers, no emesis,  No chest pain,   - -Was n.p.o. overnight to allow for capsule study -Now started on clear liquids after capsule placement  Objective: Vitals:   03/04/23 0524 03/04/23 0955 03/04/23 1422 03/04/23 1642  BP: (!) 136/52 (!) 102/58 (!) 118/37 (!) 98/46  Pulse: (!) 102 (!) 108 (!) 110 (!) 105  Resp: 18 20 16    Temp: 97.6 F (36.4 C) 97.8 F (36.6 C) 97.6 F (36.4 C)   TempSrc: Oral Oral Oral   SpO2: 98% 100% 100%   Weight:      Height:        Intake/Output Summary (Last 24 hours) at 03/04/2023 1643 Last data filed at 03/04/2023 1625 Gross per 24 hour  Intake 360 ml  Output 500 ml  Net -140 ml    Filed Weights   02/26/23 1434 02/28/23 1307  Weight: 55.9 kg 56 kg   Physical Exam Gen:- Awake Alert,  in no apparent distress  HEENT:- [place].AT, No sclera icterus Neck-Supple Neck,No JVD,.  Lungs-  CTAB , fair symmetrical air movement CV- S1, S2 normal, regular  Abd-  +ve B.Sounds, Abd Soft, no significant abdominal tenderness,   Extremity/Skin:- No  edema, pedal pulses present  Psych-affect is appropriate, oriented x3 Neuro-generalized weakness, no new focal deficits, no tremors MSK-left PICC line site with some bruising noted  Data Reviewed: I have personally reviewed following labs and imaging studies  CBC: Recent Labs  Lab 02/26/23 1511 02/27/23 0509 02/28/23 0456 02/28/23 0654 03/01/23 0503 03/01/23 1558 03/02/23 0459 03/03/23 0523 03/04/23 0510  WBC 8.5   < > 7.2  --  8.2  --  7.6 12.1* 22.9*  NEUTROABS 7.1  --   --   --   --   --   --   --   --   HGB 9.0*   < > 6.7*   < > 11.0* 10.8* 10.2* 9.2* 10.6*  HCT 27.3*   < > 20.6*   < > 33.2* 32.2* 30.8* 28.4* 32.5*  MCV 94.8   < > 95.8  --  92.7  --  92.2 94.0 94.2   PLT 171   < > 149*  --  140*  --  136* 134* 177   < > = values in this interval not displayed.   Basic Metabolic Panel: Recent Labs  Lab 02/26/23 1511 02/28/23 0456 03/01/23 0503 03/02/23 0459 03/04/23 0510  NA 132* 138 137 135 131*  K 3.7 3.2* 4.9 4.8 4.6  CL 107 111 113* 111 108  CO2 17* 19* 15* 18* 17*  GLUCOSE 328* 101* 139* 112* 64*  BUN 69* 70*  60* 57* 55*  CREATININE 2.22* 2.21* 2.00* 2.02* 1.92*  CALCIUM 7.9* 8.0* 7.7* 8.1* 8.1*   GFR: Estimated Creatinine Clearance: 23.5 mL/min (A) (by C-G formula based on SCr of 1.92 mg/dL (H)). Liver Function Tests: Recent Labs  Lab 02/26/23 1511 03/04/23 0510  AST 14* 25  ALT 9 14  ALKPHOS 78 82  BILITOT 0.7 0.5  PROT 4.3* 4.6*  ALBUMIN 1.8* 1.9*   Radiology Studies: No results found.  Scheduled Meds:  allopurinol  100 mg Oral Daily   Chlorhexidine Gluconate Cloth  6 each Topical Daily   insulin aspart  0-15 Units Subcutaneous Q4H   levETIRAcetam  500 mg Oral BID   metoprolol tartrate  12.5 mg Oral BID   pantoprazole (PROTONIX) IV  40 mg Intravenous Q12H   sodium bicarbonate  650 mg Oral TID   sodium chloride flush  10-40 mL Intracatheter Q12H   sucralfate  1 g Oral TID WC & HS   Continuous Infusions:   LOS: 4 days   Shon Hale M.D on 03/04/2023 at 4:43 PM  Go to [email or web address] - for contact info  [facility] - Office  [phone]  If 7PM-7AM, please contact night-coverage [email or web address] 03/04/2023, 4:43 PM

## 2023-03-04 NOTE — Progress Notes (Signed)
Gastroenterology Progress Note    Primary Gastroenterologist:  [facility]  Patient ID: Heather [name]; [id number]; September 19, 1951    Subjective   Sitting up in chair. No abdominal pain. Wants to get back to bed. No overt GI bleeding. No N/V. Capsule started around 10am   Objective   Vital signs in last 24 hours Temp:  [97.5 F (36.4 C)-97.8 F (36.6 C)] 97.8 F (36.6 C) (05/06 0955) Pulse Rate:  [93-108] 108 (05/06 0955) Resp:  [18-20] 20 (05/06 0955) BP: (94-136)/(52-75) 102/58 (05/06 0955) SpO2:  [98 %-100 %] 100 % (05/06 0955) Last BM Date : 03/03/23  Physical Exam General:   Alert and oriented, pleasant, sitting up in chair Head:  Normocephalic and atraumatic. Abdomen:  Bowel sounds present, soft, TTP RUQ and lower abdomen Extremities:  Without edema. Neurologic:  Alert and  oriented to person  Intake/Output from previous day: 05/05 0701 - 05/06 0700 In: 120 [P.O.:120] Out: 500 [Urine:500] Intake/Output this shift: No intake/output data recorded.  Lab Results  Recent Labs    03/02/23 0459 03/03/23 0523 03/04/23 0510  WBC 7.6 12.1* 22.9*  HGB 10.2* 9.2* 10.6*  HCT 30.8* 28.4* 32.5*  PLT 136* 134* 177   BMET Recent Labs    03/02/23 0459 03/04/23 0510  NA 135 131*  K 4.8 4.6  CL 111 108  CO2 18* 17*  GLUCOSE 112* 64*  BUN 57* 55*  CREATININE 2.02* 1.92*  CALCIUM 8.1* 8.1*   LFT Recent Labs    03/04/23 0510  PROT 4.6*  ALBUMIN 1.9*  AST 25  ALT 14  ALKPHOS 82  BILITOT 0.5     Studies/Results [place] EKG SITE RITE  Result Date: 02/27/2023 If Site Rite image not attached, placement could not be confirmed due to current cardiac rhythm.  CT ABDOMEN PELVIS WO CONTRAST  Result Date: 02/17/2023 CLINICAL DATA:  Concern for bowel obstruction. Patient reportedly has red stool. EXAM: CT ABDOMEN AND PELVIS WITHOUT CONTRAST TECHNIQUE: Multidetector CT imaging of the abdomen and pelvis was performed following the standard protocol without IV contrast.  RADIATION DOSE REDUCTION: This exam was performed according to the departmental dose-optimization program which includes automated exposure control, adjustment of the mA and/or kV according to patient size and/or use of iterative reconstruction technique. COMPARISON:  CT abdomen pelvis dated 02/04/2023. FINDINGS: Lower chest: Moderate bilateral pleural effusions with associated atelectasis are partially imaged. The heart is mildly enlarged. The blood pool is hypoattenuating, suggestive of anemia. Hepatobiliary: No focal liver abnormality is seen. Status post cholecystectomy. No biliary dilatation. Pancreas: Atrophic. A 1.8 cm low-density lesion in the pancreatic tail appears unchanged. No pancreatic ductal dilatation or surrounding inflammatory changes. Spleen: Normal in size without focal abnormality. Adrenals/Urinary Tract: Adrenal glands are unremarkable. Bilateral punctate calcifications in both kidneys may represent nonobstructing calculi measuring up to 2 mm and/or vascular calcifications. No suspicious focal lesion or hydronephrosis on either side. Bladder is unremarkable. Stomach/Bowel: Stomach is within normal limits. The appendix is not definitely identified, however no pericecal inflammatory changes are noted to suggest acute appendicitis. There is a mild bowel wall thickening and mucosal enhancement involving portions of the sigmoid colon and rectum. No significant surrounding inflammatory changes. No bowel obstruction. Vascular/Lymphatic: Aortic atherosclerosis. No enlarged abdominal or pelvic lymph nodes. Reproductive: Status post hysterectomy. No adnexal masses. Other: No abdominal wall hernia or abnormality. No abdominopelvic ascites. Anasarca is noted Musculoskeletal: There has been slight progression since 02/04/2023 of a T12 compression deformity with approximately 50% height loss centrally and 3 mm  retropulsion into the central canal. Fixation hardware is seen in the right femur. Fixation  hardware in the lumbar spine from L1-L4. IMPRESSION: 1. Mild bowel wall thickening and mucosal enhancement involving portions of the sigmoid colon and rectum may reflect proctocolitis. No bowel obstruction. 2. Moderate bilateral pleural effusions with associated atelectasis. 3. Slight progression since 02/04/2023 of a T12 compression deformity with approximately 50% height loss centrally and 3 mm retropulsion into the central canal. 4. Low-density pancreatic lesion. When the patient is clinically stable and able to follow directions and hold their breath (preferably as an outpatient) further evaluation with dedicated abdominal MRI could be considered for further characterization. Aortic Atherosclerosis (ICD10-I70.0). Electronically Signed   By: Romona Curls M.D.   On: 02/17/2023 17:36   DG Chest Port 1 View  Result Date: 02/05/2023 CLINICAL DATA:  Placement of central venous catheter EXAM: PORTABLE CHEST 1 VIEW COMPARISON:  Previous studies including the examination done on 12/15/2022 FINDINGS: Cardiac size is within normal limits. Apparent shift of mediastinum to the right may be due to rotation. There is interval placement of central venous catheter through the right internal jugular with its tip in right atrium. There is no pleural effusion or pneumothorax. Small linear densities are seen in right lower lung field. Surgical fusion is seen in cervical spine, upper thoracic spine and lumbar spine. Surgical clips are seen in gallbladder fossa. There is deformity in right clavicle consistent with old healed fracture. IMPRESSION: Tip of right IJ central venous catheter is seen in right atrium. There is no pneumothorax. Linear densities in left lower lung field may suggest subsegmental atelectasis or scarring. Electronically Signed   By: Ernie Avena M.D.   On: 02/05/2023 12:55   [place] EKG SITE RITE  Result Date: 02/05/2023 If Site Rite image not attached, placement could not be confirmed due to current  cardiac rhythm.  CT Renal Stone Study  Result Date: 02/04/2023 CLINICAL DATA:  Abdominal and flank pain with emesis. EXAM: CT ABDOMEN AND PELVIS WITHOUT CONTRAST TECHNIQUE: Multidetector CT imaging of the abdomen and pelvis was performed following the standard protocol without IV contrast. RADIATION DOSE REDUCTION: This exam was performed according to the departmental dose-optimization program which includes automated exposure control, adjustment of the mA and/or kV according to patient size and/or use of iterative reconstruction technique. COMPARISON:  CT abdomen and pelvis 08/03/2022 FINDINGS: Lower chest: No acute abnormality. Hepatobiliary: No focal liver abnormality is seen. Status post cholecystectomy. No biliary dilatation. Pancreas: Rounded low-density area in the tail the pancreas measures 18 mm in diameter and was not seen on prior. There is no acute pancreatic inflammation or ductal dilatation. Spleen: Normal in size without focal abnormality. Adrenals/Urinary Tract: Subcentimeter cortical cyst in the left kidney is unchanged. Otherwise, the adrenal glands, kidneys and bladder are within normal limits. Stomach/Bowel: There is moderate fluid distention of the stomach. There some fluid-filled jejunal loops without definite dilatation or transition point. The colon is nondilated. There is a large amount of stool throughout the colon. The appendix is not seen. No free air or pneumatosis. Vascular/Lymphatic: There are severe atherosclerotic disease throughout the aorta and peripheral vessels. No enlarged lymph nodes. No aortic aneurysm. Reproductive: Status post hysterectomy. No adnexal masses. Other: No abdominal wall hernia or abnormality. No abdominopelvic ascites. Musculoskeletal: L1-L4 posterior fusion hardware is present. There is stable compression fracture of T12. Right hip screw is present. Healed left inferior pubic ramus fracture. IMPRESSION: 1. Moderate fluid distention of the stomach. There are  some fluid-filled jejunal  loops without definite dilatation or transition point. Findings may be related to gastroenteritis. 2. Large amount of stool throughout the colon. 3. 18 mm low-density lesion in the tail of the pancreas, not seen on prior. This can be further characterized with MRI. 4. Severe atherosclerotic disease of the aorta and peripheral vessels. 5. Subcentimeter left Bosniak I benign renal cyst. No follow-up imaging is recommended. JACR 2018 Feb; 264-273, Management of the Incidental Renal Mass on CT, [employer] 2021; 814-848, Bosniak Classification of Cystic Renal Masses, Version 2019. Aortic Atherosclerosis (ICD10-I70.0). Electronically Signed   By: Darliss Cheney M.D.   On: 02/04/2023 20:42    Assessment  72 y.o. female with a history of small bowel Crohn's disease diagnosed in Jan 2022 and previously on Humira, history of pancreatitis possibly related to hypertriglyceridemia and use of Victoza, CKD, depression, diabetes, GERD, anxiety, IBS, hyperlipidemia, stroke, recent admission with EGD on 4/23 showing duodenal ulcers and repeat EGD 4/26 with visible vessel s/p clip placement, now admitted again with rectal bleeding.  This current admission with acute on chronic anemia and Hgb dropping to 6.5, s/p 2 units PRBCs on 5/2. EGD on 5/2 with duodenal ulceration improving, colonoscopy 5/3 with normal TI, sigmoid diverticulosis. Hgb 10.6 today, up from 9.2 yesterday. CTA had been planned yesterday due to drift in Hgb and melena; however, she was not a candidate due to GFR. Capsule in process today, which will be read tomorrow.       Plan / Recommendations  Capsule study today. Will be read tomorrow May have clear liquids at 1205. Return to prior diet at 1805 when capsule is complete PPI BID,Carafate QID    LOS: 4 days    03/04/2023, 10:57 AM  Gelene Mink, PhD, ANP-BC [facility]

## 2023-03-04 NOTE — Progress Notes (Signed)
Patient triggered yellow mews for BP and HR. Patient has no complaints and is not in any distress. Notified Dr. Marisa Severin and Tonna Boehringer, Charge RN. Yellow mews guidelines started.      03/04/23 1642  Assess: MEWS Score  Temp 98.1 F (36.7 C)  BP (!) 98/46  MAP (mmHg) (!) 64  Pulse Rate (!) 105  Resp 20  SpO2 100 %  O2 Device Room Air  Assess: MEWS Score  MEWS Temp 0  MEWS Systolic 1  MEWS Pulse 1  MEWS RR 0  MEWS LOC 0  MEWS Score 2  MEWS Score Color Yellow  Assess: if the MEWS score is Yellow or Red  Were vital signs taken at a resting state? Yes  Focused Assessment No change from prior assessment  Does the patient meet 2 or more of the SIRS criteria? Yes  Does the patient have a confirmed or suspected source of infection? No  MEWS guidelines implemented  Yes, yellow  Treat  MEWS Interventions Considered administering scheduled or prn medications/treatments as ordered  Take Vital Signs  Increase Vital Sign Frequency  Yellow: Q2hr x1, continue Q4hrs until patient remains green for 12hrs  Escalate  MEWS: Escalate Yellow: Discuss with charge nurse and consider notifying provider and/or RRT  Notify: Charge Nurse/RN  Name of Charge Nurse/RN Notified Tonna Boehringer, RN  Provider Notification  Provider Name/Title Dr. Marisa Severin  Date Provider Notified 03/04/23  Time Provider Notified 1710  Method of Notification Page  Notification Reason Other (Comment) (yellow mews)  Provider response No new orders  Date of Provider Response 03/04/23  Assess: SIRS CRITERIA  SIRS Temperature  0  SIRS Pulse 1  SIRS Respirations  0  SIRS WBC 1  SIRS Score Sum  2

## 2023-03-04 NOTE — Progress Notes (Signed)
CBG checked at 1745 and blood sugar 436 rechecked at 1810 and its 425. Notified Dr. Marisa Severin. See new orders.

## 2023-03-05 ENCOUNTER — Inpatient Hospital Stay (HOSPITAL_COMMUNITY): Payer: Medicare PPO

## 2023-03-05 DIAGNOSIS — N184 Chronic kidney disease, stage 4 (severe): Secondary | ICD-10-CM | POA: Diagnosis not present

## 2023-03-05 DIAGNOSIS — D649 Anemia, unspecified: Secondary | ICD-10-CM

## 2023-03-05 DIAGNOSIS — Z9889 Other specified postprocedural states: Secondary | ICD-10-CM | POA: Diagnosis not present

## 2023-03-05 DIAGNOSIS — K209 Esophagitis, unspecified without bleeding: Secondary | ICD-10-CM | POA: Diagnosis not present

## 2023-03-05 DIAGNOSIS — K254 Chronic or unspecified gastric ulcer with hemorrhage: Secondary | ICD-10-CM | POA: Diagnosis not present

## 2023-03-05 DIAGNOSIS — D62 Acute posthemorrhagic anemia: Secondary | ICD-10-CM | POA: Diagnosis not present

## 2023-03-05 DIAGNOSIS — K5730 Diverticulosis of large intestine without perforation or abscess without bleeding: Secondary | ICD-10-CM | POA: Diagnosis not present

## 2023-03-05 DIAGNOSIS — K269 Duodenal ulcer, unspecified as acute or chronic, without hemorrhage or perforation: Secondary | ICD-10-CM | POA: Diagnosis not present

## 2023-03-05 DIAGNOSIS — Z8719 Personal history of other diseases of the digestive system: Secondary | ICD-10-CM

## 2023-03-05 LAB — GLUCOSE, CAPILLARY
Glucose-Capillary: 41 mg/dL — CL (ref 70–99)
Glucose-Capillary: 98 mg/dL (ref 70–99)
Glucose-Capillary: 52 mg/dL — ABNORMAL LOW (ref 70–99)
Glucose-Capillary: 55 mg/dL — ABNORMAL LOW (ref 70–99)
Glucose-Capillary: 56 mg/dL — ABNORMAL LOW (ref 70–99)
Glucose-Capillary: 57 mg/dL — ABNORMAL LOW (ref 70–99)
Glucose-Capillary: 90 mg/dL (ref 70–99)
Glucose-Capillary: 192 mg/dL — ABNORMAL HIGH (ref 70–99)
Glucose-Capillary: 153 mg/dL — ABNORMAL HIGH (ref 70–99)
Glucose-Capillary: 263 mg/dL — ABNORMAL HIGH (ref 70–99)

## 2023-03-05 LAB — BASIC METABOLIC PANEL
Anion gap: 5 (ref 5–15)
BUN: 52 mg/dL — ABNORMAL HIGH (ref 8–23)
CO2: 18 mmol/L — ABNORMAL LOW (ref 22–32)
Calcium: 8.3 mg/dL — ABNORMAL LOW (ref 8.9–10.3)
Chloride: 108 mmol/L (ref 98–111)
Creatinine, Ser: 1.73 mg/dL — ABNORMAL HIGH (ref 0.44–1.00)
GFR, Estimated: 31 mL/min — ABNORMAL LOW (ref >60)
Glucose, Bld: 50 mg/dL — ABNORMAL LOW (ref 70–99)
Potassium: 4.2 mmol/L (ref 3.5–5.1)
Sodium: 131 mmol/L — ABNORMAL LOW (ref 135–145)

## 2023-03-05 LAB — CBC
HCT: 28.9 % — ABNORMAL LOW (ref 36.0–46.0)
Hemoglobin: 9.6 g/dL — ABNORMAL LOW (ref 12.0–15.0)
MCH: 31 pg (ref 26.0–34.0)
MCHC: 33.2 g/dL (ref 30.0–36.0)
MCV: 93.2 fL (ref 80.0–100.0)
Platelets: 170 10*3/uL (ref 150–400)
RBC: 3.1 MIL/uL — ABNORMAL LOW (ref 3.87–5.11)
RDW: 15.4 % (ref 11.5–15.5)
WBC: 18.7 10*3/uL — ABNORMAL HIGH (ref 4.0–10.5)
nRBC: 0 % (ref 0.0–0.2)

## 2023-03-05 MED ORDER — POLYETHYLENE GLYCOL 3350 17 G PO PACK
17.0000 g | PACK | Freq: Every day | ORAL | Status: DC
  Administered 2023-03-05 – 2023-03-08 (×3): 17 g via ORAL
  Filled 2023-03-05 (×4): qty 1

## 2023-03-05 MED ORDER — DEXTROSE 50 % IV SOLN
INTRAVENOUS | Status: AC
  Filled 2023-03-05: qty 50

## 2023-03-05 NOTE — Op Note (Addendum)
Small Bowel [place] Capsule Study Procedure date:  03/04/23  Referring Provider:  Dr. Levon Hedger PCP:  Dr. [name], MD  Indication for procedure:  Acute blood loss anemia, history of small bowel Crohn's, off Humira, EGD and colonoscopy unrevealing.   EGD 02/19/23 with esophageal ulcers, esophagitis, duodenal ulcers with associated clots s/p clip placed.  Pathology with gastritis and peptic duodenitis.  EGD 4/26 with ulcerative esophagitis, geographic duodenal ulceration, visible vessel s/p epi and clip placement.  EGD 5/2 with esophagitis, duodenal ulcer disease much improved.  Colonoscopy 5/3 with essentially normal exam aside from diverticulosis.   Patient data:  Wt: 56 kg Ht: 5\' 4"    Findings:  Esophagitis, gastritis, ulcerated, erythematous small bowel, most severe is the proximal small bowel. Hemostasis clip seen at 16:02 and 03:00:07. Suspect capsule was stuck in the proximal small bowel as the ulcerations appear similar for the first 3 hours and there was only 1 hemostasis clip left from recent procedures. Evidence of bleeding with wisps and droplets of red blood throughout the small bowel, but no specific bleeding lesion identified, scattered erosions and/or small AMVs. Study was incomplete. Capsule did not make it to the cecum.   First Gastric image:  00:01:34 First Duodenal image: 00:04:00 First Cecal image: N/A- Study incomplete.  Gastric Passage time:  14 m Small Bowel Passage time:  Unable to calculate. Capsule in the small bowel for over 8 hours and did not make it to the cecum.   Summary & Recommendations: Esophagitis, gastritis, ulcerated, erythematous small bowel, most severe is the proximal small bowel. Scattered erosions and/or small AMVs. Evidence of bleeding with wisps and droplets of red blood throughout the small bowel, but no specific bleeding lesion identified. Suspect capsule was stuck in the proximal small bowel for extended period of time as ulcerations appear  similar and hemostasis clip was seen at 16:02 and 03:00:07, but only 1 hemostasis clip was left from recent procedures.   Etiology of her ulcerations is not clear. Could be secondary to prior daily NSAID use, but unable to rule out Crohn's as she has been off Humira for an unknown length of time.   As her hemoglobin has dropped to 9.6 today from 10.6 yesterday and bleeding was noted on capsule, recommend push enteroscopy tomorrow for further evaluation and to obtain additional biopsies.     As  her capsule was not complete, x ray ordered today and showed capsule in RLQ. Will need to repeat DG abdomen tomorrow.    Ermalinda Memos, PA-C [facility] 03/05/2023   Gastroenterology attending attestation note: Agree with the below findings as written. The patient was presented to me by the APP (Advanced Practice Provider) I personally reviewed the medical chart and evaluated the patient. I personally evaluated and examined the patient by myself.  I agree with Mrs. Gwendalyn Ege Harper's H/P and assessment.  Agree with findings, capsule endoscopy was likely retained for some time in the duodenum but eventually passed to the rest of the small bowel.  There were some droplets of blood in the proximal small bowel without any identifiable lesions, there were a few erosions.  We will evaluate this further with a push enteroscopy tomorrow.  Katrinka Blazing, MD Gastroenterology and Hepatology [facility]

## 2023-03-05 NOTE — Progress Notes (Signed)
CBG was 41 mg/dL, rechecked and was 98 mg/dL, hospitalist made aware.

## 2023-03-05 NOTE — Progress Notes (Signed)
PROGRESS NOTE     Heather [name], is a 72 y.o. female, DOB - 1951/01/27, ZOX:[id number]  Admit date - 02/26/2023   Admitting Physician Heather [name] Clonts, MD  Outpatient Primary MD for the patient is Heather Peri, MD  LOS - 5  Chief Complaint  Patient presents with   Rectal Bleeding        Brief Narrative:  72 y.o. female with medical history significant for GI bleed, CKD 4, CVA, D CHF,  DM, HTN with history of peptic ulcer disease with recent bleeding from duodenal ulcer,, small bowel Crohn's disease admitted with small-volume bright red blood per rectum on 02/26/2023    -Assessment and Plan: 1)Acute GI bleed -Patient's hemoglobin was 3.2 on 02/17/2023--patient required transfusion of PRBC and endoscopy x 2 during that hospitalization -Discharge Hgb on 02/25/2023 was 9.6 -Hemoglobin currently around 9  S/p recent  EGD and flex sig 4/23 and repeat EGD 4/26 due to persistent bleeding Initial EGD from 02/19/2023 showed esophagitis with ulcers as well extensive duodenal ulceration on initial EGD.  Visible vessel treated with epinephrine and hemostasis clip.  She developed recurrent bleeding  - underwent a repeat EGD on 02/22/23 which revealed a visible vessel at a different site in the ulcer crater requiring epinephrine and clip placement.   -H. pylori negative - Limited FS on 02/22/2023 was with  poor prep nondiagnostic. -GI consult appreciated -Patient has received 2 units of PRBC this admission so far -Colonoscopy on 03/01/2023 shows no presence of inflammation hematin or active bleeding was present thorughout the colon.  -c/n IV Protonix and Carafate -Hgb was 11 after transfusion of 2 units of packed blood cells on 02/28/2023 - -03/05/23 -Unable to do CT angio GI bleed protocol due to renal function -  capsule endoscopy started on 03/04/2023 around 1 AM---she was findings that are concerning for active bleeding, but Capsule Study was incomplete -GI service plans to do push enteroscopy on  03/06/2023  2)Chronic diastolic CHF (congestive heart failure) (HCC) Stable and compensated.  Last echo 11/2022, EF of 60 to 65%, grade 1 DD.  Not on diuretic. -Monitor closely  3)Diabetes mellitus (HCC) -A1c 6.4 reflecting good diabetic control PTA -Hold Lantus until oral intake is more reliable Use Novolog/Humalog Sliding scale insulin with Accu-Cheks/Fingersticks as ordered   4)HTN Stable, reduce metoprolol to 12.5 mg twice daily   5)Crohn's disease of small intestine without complication (HCC) Stable.  Previously treated with Humira currently not on medications for this -Recent biopsy during endoscopy was nondiagnostic  6) CKD stage -IV -Renal function currently close to baseline -Creatinine is down to 1.73 from 2.43 on 02/25/2023 -Renally adjust medications, avoid nephrotoxic agents / dehydration  / hypotension  7)Mild Hyponatremia, not POA----suspect dehydration related, monitor closely  Status is: Inpatient   Disposition: The patient is from: Home              Anticipated d/c is to: Home              Anticipated d/c date is: 1 day              Patient currently is not medically stable to d/c. Barriers: Not Clinically Stable-   Code Status :  -  Code Status: Full Code   Family Communication:     (patient is alert, awake and coherent)  -Patient's husband updated  DVT Prophylaxis  :   - SCDs  SCDs Start: 02/26/23 2036   Lab Results  Component Value Date   PLT 170 03/05/2023  Inpatient Medications  Scheduled Meds:  allopurinol  100 mg Oral Daily   Chlorhexidine Gluconate Cloth  6 each Topical Daily   insulin aspart  0-15 Units Subcutaneous Q4H   levETIRAcetam  500 mg Oral BID   metoprolol tartrate  12.5 mg Oral BID   pantoprazole (PROTONIX) IV  40 mg Intravenous Q12H   polyethylene glycol  17 g Oral Daily   sodium bicarbonate  650 mg Oral TID   sodium chloride flush  10-40 mL Intracatheter Q12H   sucralfate  1 g Oral TID WC & HS   Continuous  Infusions:  PRN Meds:.acetaminophen **OR** acetaminophen, melatonin, polyethylene glycol, prochlorperazine, sodium chloride flush   Anti-infectives (From admission, onward)    Start     Dose/Rate Route Frequency Ordered Stop   02/26/23 2200  cefdinir (OMNICEF) capsule 300 mg  Status:  Discontinued        300 mg Oral 2 times daily 02/26/23 2038 02/26/23 2041      Subjective: [name] today has no fevers, no emesis,  No chest pain,   - -03/05/23 -Tolerating liquid diet well -Unable to do CT angio GI bleed protocol due to renal function -  capsule endoscopy started on 03/04/2023 around 1 AM---she was findings that are concerning for active bleeding, but Capsule Study was incomplete -GI service plans to do push enteroscopy on 03/06/2023  Objective: Vitals:   03/04/23 1832 03/04/23 2117 03/05/23 0523 03/05/23 1348  BP: 135/61 125/73 (!) 119/59 (!) 143/58  Pulse: (!) 116 (!) 105 99 (!) 105  Resp: 20 20 19 17   Temp: 97.8 F (36.6 C) 97.8 F (36.6 C) 97.7 F (36.5 C) 98 F (36.7 C)  TempSrc: Oral Oral Oral Oral  SpO2: 100% 100% 100% 100%  Weight:      Height:        Intake/Output Summary (Last 24 hours) at 03/05/2023 2008 Last data filed at 03/05/2023 1700 Gross per 24 hour  Intake 720 ml  Output 400 ml  Net 320 ml    Filed Weights   02/26/23 1434 02/28/23 1307  Weight: 55.9 kg 56 kg   Physical Exam Gen:- Awake Alert,  in no apparent distress  HEENT:- [place].AT, No sclera icterus Neck-Supple Neck,No JVD,.  Lungs-  CTAB , fair symmetrical air movement CV- S1, S2 normal, regular  Abd-  +ve B.Sounds, Abd Soft, no significant abdominal tenderness,   Extremity/Skin:- No  edema, pedal pulses present  Psych-affect is appropriate, oriented x3 Neuro-generalized weakness, no new focal deficits, no tremors MSK-left PICC line site with some bruising noted  Data Reviewed: I have personally reviewed following labs and imaging studies  CBC: Recent Labs  Lab 03/01/23 0503  03/01/23 1558 03/02/23 0459 03/03/23 0523 03/04/23 0510 03/05/23 0436  WBC 8.2  --  7.6 12.1* 22.9* 18.7*  HGB 11.0* 10.8* 10.2* 9.2* 10.6* 9.6*  HCT 33.2* 32.2* 30.8* 28.4* 32.5* 28.9*  MCV 92.7  --  92.2 94.0 94.2 93.2  PLT 140*  --  136* 134* 177 170   Basic Metabolic Panel: Recent Labs  Lab 02/28/23 0456 03/01/23 0503 03/02/23 0459 03/04/23 0510 03/05/23 0436  NA 138 137 135 131* 131*  K 3.2* 4.9 4.8 4.6 4.2  CL 111 113* 111 108 108  CO2 19* 15* 18* 17* 18*  GLUCOSE 101* 139* 112* 64* 50*  BUN 70* 60* 57* 55* 52*  CREATININE 2.21* 2.00* 2.02* 1.92* 1.73*  CALCIUM 8.0* 7.7* 8.1* 8.1* 8.3*   GFR: Estimated Creatinine Clearance: 26.1 mL/min (A) (  by C-G formula based on SCr of 1.73 mg/dL (H)). Liver Function Tests: Recent Labs  Lab 03/04/23 0510  AST 25  ALT 14  ALKPHOS 82  BILITOT 0.5  PROT 4.6*  ALBUMIN 1.9*   Radiology Studies: DG Abd 1 View  Result Date: 03/05/2023 CLINICAL DATA:  Retained foreign body. Retained capsule from yesterday. EXAM: ABDOMEN - 1 VIEW COMPARISON:  CT abdomen/pelvis 02/17/2023. FINDINGS: No dilated loops of small bowel are demonstrated to suggest small bowel obstruction. Moderate colonic stool burden. A 1.7 cm dense object projects in the region of the right lower quadrant of the abdomen/right hemipelvis (see annotations on images). Surgical clips within the right upper quadrant of the abdomen. Lumbar posterior spinal fusion construct. Prior ORIF of the right femur, incompletely imaged.No acute osseous abnormality identified. IMPRESSION: 1. 1.7 cm dense object projecting in the region of the right lower quadrant of the abdomen/right hemipelvis, likely reflecting the reported endoscopy capsule. 2. Nonobstructive bowel gas pattern. 3. Moderate colonic stool burden. Electronically Signed   By: Jackey Loge D.O.   On: 03/05/2023 14:05    Scheduled Meds:  allopurinol  100 mg Oral Daily   Chlorhexidine Gluconate Cloth  6 each Topical Daily    insulin aspart  0-15 Units Subcutaneous Q4H   levETIRAcetam  500 mg Oral BID   metoprolol tartrate  12.5 mg Oral BID   pantoprazole (PROTONIX) IV  40 mg Intravenous Q12H   polyethylene glycol  17 g Oral Daily   sodium bicarbonate  650 mg Oral TID   sodium chloride flush  10-40 mL Intracatheter Q12H   sucralfate  1 g Oral TID WC & HS   Continuous Infusions:   LOS: 5 days   Shon Hale M.D on 03/05/2023 at 8:08 PM  Go to [email or web address] - for contact info  [facility] - Office  [phone]  If 7PM-7AM, please contact night-coverage [email or web address] 03/05/2023, 8:08 PM

## 2023-03-05 NOTE — Inpatient Diabetes Management (Signed)
Inpatient Diabetes Program Recommendations  AACE/ADA: New Consensus Statement on Inpatient Glycemic Control (2015)  Target Ranges:  Prepandial:   less than 140 mg/dL      Peak postprandial:   less than 180 mg/dL (1-2 hours)      Critically ill patients:  140 - 180 mg/dL   Lab Results  Component Value Date   GLUCAP 90 03/05/2023   HGBA1C 5.7 (H) 02/26/2023    Latest Reference Range & Units 03/04/23 00:10 03/04/23 06:02 03/04/23 06:40 03/04/23 08:14 03/04/23 11:22 03/04/23 17:45 03/04/23 18:10 03/04/23 21:19 03/04/23 23:35  Glucose-Capillary 70 - 99 mg/dL 161 (H) Novolog 3 units 41 (LL) D50 for hypoglycemia 122 (H) 102 (H) 95 436 (H) 425 (H) Novolog 20 units 330 (H) Novolog 11 units 184 (H) Novolog 3 units @ 0100  (LL): Data is critically low (H): Data is abnormally high  Latest Reference Range & Units 03/05/23 04:12 03/05/23 04:22 03/05/23 06:00 03/05/23 06:18 03/05/23 06:33 03/05/23 06:47 03/05/23 07:12  Glucose-Capillary 70 - 99 mg/dL 41 (LL) 98 55 (L) 52 (L) 56 (L) 57 (L) 90  (LL): Data is critically low (L): Data is abnormally low  Diabetes history: DM2 Outpatient Diabetes medications: Semglee 15 units daily, Novolog 2-5 units TID  Current orders for Inpatient glycemic control: Novolog 0-15 Q4H correction  Inpatient Diabetes Program Recommendations:   Patient had hypoglycemia post Novolog correction, then elevated with D50 administered, then hypoglycemia with Novolog correction. Please consider: -Add Semglee 5 units qd -Decrease Novolog correction to 0-6 units q 4 hrs.  Thank you, Heather [name]. Heather Lesko, RN, MSN, CDE  Diabetes Coordinator Inpatient Glycemic Control Team Team Pager [phone] (8am-5pm) 03/05/2023 7:39 AM

## 2023-03-06 ENCOUNTER — Inpatient Hospital Stay (HOSPITAL_COMMUNITY): Payer: Medicare PPO | Admitting: Certified Registered Nurse Anesthetist

## 2023-03-06 ENCOUNTER — Inpatient Hospital Stay (HOSPITAL_COMMUNITY): Payer: Medicare PPO

## 2023-03-06 ENCOUNTER — Encounter (HOSPITAL_COMMUNITY)
Admission: EM | Disposition: A | Discharge status: Home / Self Care | Payer: Self-pay | Source: Home / Self Care | Attending: Family Medicine

## 2023-03-06 ENCOUNTER — Encounter (HOSPITAL_COMMUNITY): Payer: Self-pay | Admitting: Internal Medicine

## 2023-03-06 DIAGNOSIS — K269 Duodenal ulcer, unspecified as acute or chronic, without hemorrhage or perforation: Secondary | ICD-10-CM

## 2023-03-06 DIAGNOSIS — Z8719 Personal history of other diseases of the digestive system: Secondary | ICD-10-CM | POA: Diagnosis not present

## 2023-03-06 DIAGNOSIS — D62 Acute posthemorrhagic anemia: Secondary | ICD-10-CM | POA: Diagnosis not present

## 2023-03-06 DIAGNOSIS — I110 Hypertensive heart disease with heart failure: Secondary | ICD-10-CM

## 2023-03-06 DIAGNOSIS — I159 Secondary hypertension, unspecified: Secondary | ICD-10-CM | POA: Diagnosis not present

## 2023-03-06 DIAGNOSIS — I509 Heart failure, unspecified: Secondary | ICD-10-CM

## 2023-03-06 DIAGNOSIS — I2510 Atherosclerotic heart disease of native coronary artery without angina pectoris: Secondary | ICD-10-CM

## 2023-03-06 DIAGNOSIS — K922 Gastrointestinal hemorrhage, unspecified: Secondary | ICD-10-CM | POA: Diagnosis not present

## 2023-03-06 DIAGNOSIS — K209 Esophagitis, unspecified without bleeding: Secondary | ICD-10-CM | POA: Diagnosis not present

## 2023-03-06 HISTORY — PX: ENTEROSCOPY: SHX5533

## 2023-03-06 LAB — RENAL FUNCTION PANEL
Albumin: 1.7 g/dL — ABNORMAL LOW (ref 3.5–5.0)
Anion gap: 6 (ref 5–15)
BUN: 48 mg/dL — ABNORMAL HIGH (ref 8–23)
CO2: 18 mmol/L — ABNORMAL LOW (ref 22–32)
Calcium: 8.2 mg/dL — ABNORMAL LOW (ref 8.9–10.3)
Chloride: 108 mmol/L (ref 98–111)
Creatinine, Ser: 1.5 mg/dL — ABNORMAL HIGH (ref 0.44–1.00)
GFR, Estimated: 37 mL/min — ABNORMAL LOW (ref >60)
Glucose, Bld: 62 mg/dL — ABNORMAL LOW (ref 70–99)
Phosphorus: 2.3 mg/dL — ABNORMAL LOW (ref 2.5–4.6)
Potassium: 4.3 mmol/L (ref 3.5–5.1)
Sodium: 132 mmol/L — ABNORMAL LOW (ref 135–145)

## 2023-03-06 LAB — GLUCOSE, CAPILLARY
Glucose-Capillary: 105 mg/dL — ABNORMAL HIGH (ref 70–99)
Glucose-Capillary: 122 mg/dL — ABNORMAL HIGH (ref 70–99)
Glucose-Capillary: 130 mg/dL — ABNORMAL HIGH (ref 70–99)
Glucose-Capillary: 143 mg/dL — ABNORMAL HIGH (ref 70–99)
Glucose-Capillary: 308 mg/dL — ABNORMAL HIGH (ref 70–99)
Glucose-Capillary: 330 mg/dL — ABNORMAL HIGH (ref 70–99)
Glucose-Capillary: 390 mg/dL — ABNORMAL HIGH (ref 70–99)
Glucose-Capillary: 58 mg/dL — ABNORMAL LOW (ref 70–99)
Glucose-Capillary: 92 mg/dL (ref 70–99)
Glucose-Capillary: 97 mg/dL (ref 70–99)

## 2023-03-06 LAB — CBC
HCT: 28.1 % — ABNORMAL LOW (ref 36.0–46.0)
Hemoglobin: 9.3 g/dL — ABNORMAL LOW (ref 12.0–15.0)
MCH: 31 pg (ref 26.0–34.0)
MCHC: 33.1 g/dL (ref 30.0–36.0)
MCV: 93.7 fL (ref 80.0–100.0)
Platelets: 135 10*3/uL — ABNORMAL LOW (ref 150–400)
RBC: 3 MIL/uL — ABNORMAL LOW (ref 3.87–5.11)
RDW: 15.3 % (ref 11.5–15.5)
WBC: 10.1 10*3/uL (ref 4.0–10.5)
nRBC: 0 % (ref 0.0–0.2)

## 2023-03-06 SURGERY — ENTEROSCOPY
Anesthesia: General

## 2023-03-06 MED ORDER — PROPOFOL 10 MG/ML IV BOLUS
INTRAVENOUS | Status: DC | PRN
  Administered 2023-03-06: 30 mg via INTRAVENOUS
  Administered 2023-03-06: 50 mg via INTRAVENOUS

## 2023-03-06 MED ORDER — DEXTROSE 50 % IV SOLN
INTRAVENOUS | Status: AC
  Filled 2023-03-06: qty 50

## 2023-03-06 MED ORDER — PHENYLEPHRINE 80 MCG/ML (10ML) SYRINGE FOR IV PUSH (FOR BLOOD PRESSURE SUPPORT)
PREFILLED_SYRINGE | INTRAVENOUS | Status: AC
  Filled 2023-03-06: qty 10

## 2023-03-06 MED ORDER — GLUCAGON HCL RDNA (DIAGNOSTIC) 1 MG IJ SOLR
INTRAMUSCULAR | Status: AC
  Filled 2023-03-06: qty 1

## 2023-03-06 MED ORDER — DEXTROSE 50 % IV SOLN
12.5000 g | INTRAVENOUS | Status: AC
  Administered 2023-03-06: 12.5 g via INTRAVENOUS

## 2023-03-06 MED ORDER — PHENYLEPHRINE 80 MCG/ML (10ML) SYRINGE FOR IV PUSH (FOR BLOOD PRESSURE SUPPORT)
PREFILLED_SYRINGE | INTRAVENOUS | Status: DC | PRN
  Administered 2023-03-06: 160 ug via INTRAVENOUS
  Administered 2023-03-06: 80 ug via INTRAVENOUS
  Administered 2023-03-06: 160 ug via INTRAVENOUS
  Administered 2023-03-06 (×3): 80 ug via INTRAVENOUS

## 2023-03-06 MED ORDER — GLUCAGON HCL RDNA (DIAGNOSTIC) 1 MG IJ SOLR
INTRAMUSCULAR | Status: DC | PRN
  Administered 2023-03-06: 1 mg via INTRAVENOUS

## 2023-03-06 MED ORDER — STERILE WATER FOR IRRIGATION IR SOLN
Status: DC | PRN
  Administered 2023-03-06: .6 mL

## 2023-03-06 MED ORDER — SODIUM CHLORIDE 0.9 % IV SOLN: INTRAVENOUS | Status: DC

## 2023-03-06 MED ORDER — LIDOCAINE HCL (CARDIAC) PF 100 MG/5ML IV SOSY
PREFILLED_SYRINGE | INTRAVENOUS | Status: DC | PRN
  Administered 2023-03-06: 50 mg via INTRAVENOUS

## 2023-03-06 MED ORDER — PROPOFOL 500 MG/50ML IV EMUL
INTRAVENOUS | Status: DC | PRN
  Administered 2023-03-06: 100 ug/kg/min via INTRAVENOUS

## 2023-03-06 MED ORDER — BUDESONIDE 3 MG PO CPEP
9.0000 mg | ORAL_CAPSULE | Freq: Every day | ORAL | Status: DC
  Administered 2023-03-06 – 2023-03-09 (×4): 9 mg via ORAL
  Filled 2023-03-06 (×4): qty 3

## 2023-03-06 MED ORDER — LACTATED RINGERS IV SOLN: INTRAVENOUS | Status: DC

## 2023-03-06 NOTE — Interval H&P Note (Signed)
History and Physical Interval Note:  03/06/2023 2:54 PM  Heather [name]  has presented today for surgery, with the diagnosis of Acute on chronc anemia, rectal bleeding, history of Crohns disease, givens capsule with proximal small bowel ulceration and bleeding without identified lesion.  The various methods of treatment have been discussed with the patient and family. After consideration of risks, benefits and other options for treatment, the patient has consented to  Procedure(s): ENTEROSCOPY (N/A) as a surgical intervention.  The patient's history has been reviewed, patient examined, no change in status, stable for surgery.  I have reviewed the patient's chart and labs.  Questions were answered to the patient's satisfaction.     [name]    Hemoglobin 9.6 this morning stuttering GI bleed abnormal small bowel on capsule capsule incomplete.   findings suspicious for occult bleeding lesion all bowel.  Incomplete study.  He would need for enteroscopy.  Discussed risk benefits limitations with the patient.  Questions answered she is agreeable

## 2023-03-06 NOTE — Transfer of Care (Signed)
Immediate Anesthesia Transfer of Care Note  Patient: Heather [name]  Procedure(s) Performed: ENTEROSCOPY  Patient Location: PACU  Anesthesia Type:General  Level of Consciousness: drowsy  Airway & Oxygen Therapy: Patient Spontanous Breathing  Post-op Assessment: Report given to RN and Post -op Vital signs reviewed and stable  Post vital signs: Reviewed and stable  Last Vitals:  Vitals Value Taken Time  BP 79/33 03/06/23 1526  Temp    Pulse 92 03/06/23 1528  Resp 14 03/06/23 1528  SpO2 100 % 03/06/23 1528  Vitals shown include unvalidated device data.  Last Pain:  Vitals:   03/06/23 1503  TempSrc:   PainSc: 9       Patients Stated Pain Goal: 0 (03/04/23 1422)  Complications: No notable events documented.

## 2023-03-06 NOTE — Inpatient Diabetes Management (Signed)
Inpatient Diabetes Program Recommendations  AACE/ADA: New Consensus Statement on Inpatient Glycemic Control (2015)  Target Ranges:  Prepandial:   less than 140 mg/dL      Peak postprandial:   less than 180 mg/dL (1-2 hours)      Critically ill patients:  140 - 180 mg/dL   Lab Results  Component Value Date   GLUCAP 92 03/06/2023   HGBA1C 5.7 (H) 02/26/2023    Review of Glycemic Control  Latest Reference Range & Units 03/06/23 00:35 03/06/23 03:57 03/06/23 04:53 03/06/23 07:47  Glucose-Capillary 70 - 99 mg/dL 161 (H) 58 (L) 97 92  (H): Data is abnormally high (L): Data is abnormally low  Diabetes history: DM2 Outpatient Diabetes medications: Semglee 15 units daily, Novolog 2-5 units TID  Current orders for Inpatient glycemic control: Novolog 0-15 Q4H correction  Inpatient Diabetes Program Recommendations:    Continues to have episodes of hypoglycemia.  Please consider:  Novolog 0-6 units Q4H  Will continue to follow while inpatient.  Thank you, Dulce Sellar, MSN, CDCES Diabetes Coordinator Inpatient Diabetes Program [phone] (team pager from 8a-5p)

## 2023-03-06 NOTE — Anesthesia Preprocedure Evaluation (Signed)
Anesthesia Evaluation  Patient identified by MRN, date of birth, ID band Patient awake    Reviewed: Allergy & Precautions, H&P , NPO status , Patient's Chart, lab work & pertinent test results, reviewed documented beta blocker date and time   Airway Mallampati: II  TM Distance: >3 FB Neck ROM: full    Dental no notable dental hx. (+) Dental Advisory Given, Teeth Intact   Pulmonary neg pulmonary ROS, shortness of breath and with exertion, pneumonia   Pulmonary exam normal breath sounds clear to auscultation       Cardiovascular Exercise Tolerance: Good METS (wheelchair bound): hypertension, Pt. on medications and Pt. on home beta blockers + CAD and +CHF  negative cardio ROS Normal cardiovascular exam Rhythm:regular Rate:Normal  1. Left ventricular ejection fraction, by estimation, is 60 to 65%. The  left ventricle has normal function. The left ventricle has no regional  wall motion abnormalities. Left ventricular diastolic parameters are  consistent with Grade I diastolic  dysfunction (impaired relaxation).   2. Right ventricular systolic function is normal. The right ventricular  size is normal.   3. Left atrial size was moderately dilated.   4. Right atrial size was mildly dilated.   5. The mitral valve is normal in structure. Trivial mitral valve  regurgitation. No evidence of mitral stenosis. Moderate mitral annular  calcification.   6. The aortic valve is tricuspid. There is moderate calcification of the  aortic valve. Aortic valve regurgitation is not visualized. Mild aortic  valve stenosis. Aortic valve area, by VTI measures 1.95 cm. Aortic valve  mean gradient measures 7.0 mmHg.  Aortic valve Vmax measures 1.69 m/s.   7. The inferior vena cava is normal in size with greater than 50%  respiratory variability, suggesting right atrial pressure of 3 mmHg.   Comparison(s): EF 60-65%.     Neuro/Psych  Headaches  PSYCHIATRIC DISORDERS Anxiety Depression     Neuromuscular disease CVA negative neurological ROS  negative psych ROS   GI/Hepatic negative GI ROS, Neg liver ROS, hiatal hernia, PUD,GERD  Medicated,,  Endo/Other  negative endocrine ROSdiabetes, Well Controlled, Type 2, Insulin Dependent    Renal/GU Renal diseasenegative Renal ROS  negative genitourinary   Musculoskeletal  (+) Arthritis , Osteoarthritis,    Abdominal   Peds negative pediatric ROS (+)  Hematology negative hematology ROS (+) Blood dyscrasia, anemia   Anesthesia Other Findings   Reproductive/Obstetrics negative OB ROS                             Anesthesia Physical Anesthesia Plan  ASA: 4 and emergent  Anesthesia Plan: General   Post-op Pain Management: Minimal or no pain anticipated   Induction: Intravenous  PONV Risk Score and Plan: Propofol infusion  Airway Management Planned: Nasal Cannula and Natural Airway  Additional Equipment:   Intra-op Plan:   Post-operative Plan: Possible Post-op intubation/ventilation  Informed Consent: I have reviewed the patients History and Physical, chart, labs and discussed the procedure including the risks, benefits and alternatives for the proposed anesthesia with the patient or authorized representative who has indicated his/her understanding and acceptance.     Dental Advisory Given  Plan Discussed with: CRNA  Anesthesia Plan Comments:         Anesthesia Quick Evaluation

## 2023-03-06 NOTE — Progress Notes (Signed)
PROGRESS NOTE     Heather [name], is a 72 y.o. female, DOB - 06/24/1951, ZOX:[id number]  Admit date - 02/26/2023   Admitting Physician [name] [name], MD  Outpatient Primary MD for the patient is [name], MD  LOS - 6  Chief Complaint  Patient presents with   Rectal Bleeding        Brief Narrative:  72 y.o. female with medical history significant for GI bleed, CKD 4, CVA, D CHF,  DM, HTN with history of peptic ulcer disease with recent bleeding from duodenal ulcer,, small bowel Crohn's disease admitted with small-volume bright red blood per rectum on 02/26/2023    -Assessment and Plan: 1)Acute GI bleed -Patient's hemoglobin was 3.2 on 02/17/2023--patient required transfusion of PRBC and endoscopy x 2 during that hospitalization -Discharge Hgb on 02/25/2023 was 9.6 -Hemoglobin currently around 9  S/p recent  EGD and flex sig 4/23 and repeat EGD 4/26 due to persistent bleeding Initial EGD from 02/19/2023 showed esophagitis with ulcers as well extensive duodenal ulceration on initial EGD.  Visible vessel treated with epinephrine and hemostasis clip.  She developed recurrent bleeding  - underwent a repeat EGD on 02/22/23 which revealed a visible vessel at a different site in the ulcer crater requiring epinephrine and clip placement.   -H. pylori negative - Limited FS on 02/22/2023 was with  poor prep nondiagnostic. -GI consult appreciated -Patient has received 2 units of PRBC this admission so far -Colonoscopy on 03/01/2023 shows no presence of inflammation hematin or active bleeding was present thorughout the colon.  -c/n IV Protonix and Carafate -Hgb was 11 after transfusion of 2 units of packed blood cells on 02/28/2023 - -03/05/23 -Unable to do CT angio GI bleed protocol due to renal function -Following gastroenterology service plan is for push enteroscopy evaluation later today. -Continue n.p.o. status at the moment -Follow GI service recommendations.  2)Chronic diastolic CHF  (congestive heart failure) (HCC) Stable and compensated.  Last echo 11/2022, EF of 60 to 65%, grade 1 DD.  Not on diuretic. -Continue to follow daily weights and strict I's and O's -Currently stable and compensated.  3)Diabetes mellitus (HCC) -A1c 6.4 reflecting good diabetic control PTA -Continue to hold Lantus until oral intake is more reliable -Continue to follow CBGs and use sliding scale insulin.  I will avoid coverage unless consistently above 200.  4)HTN -Stable -Continue current antihypertensive agents.  5)Crohn's disease of small intestine without complication (HCC) Stable.  Previously treated with Humira currently not on medications for this. -Recent biopsy during endoscopy was nondiagnostic -Continue to have follow-up with gastroenterology service.  6) CKD stage -IV -Renal function currently close to baseline; continue to follow renal function trend. -Creatinine is down to 1.73 from 2.43 on 02/25/2023 -Renally adjust medications, avoid nephrotoxic agents / dehydration  / hypotension  7)Mild Hyponatremia, not POA----suspect dehydration  -Continue to follow electrolytes intermittently -Maintain adequate hydration.  Status is: Inpatient   Disposition: The patient is from: Home              Anticipated d/c is to: Home              Anticipated d/c date is: 1 day              Patient currently is not medically stable to d/c. Barriers: Not Clinically Stable-   Code Status :  -  Code Status: Full Code   Family Communication:     (patient is alert, awake and coherent)  -Patient's husband updated  DVT  Prophylaxis  :   - SCDs  SCDs Start: 02/26/23 2036   Lab Results  Component Value Date   PLT 135 (L) 03/06/2023   Inpatient Medications  Scheduled Meds:  allopurinol  100 mg Oral Daily   budesonide  9 mg Oral Daily   Chlorhexidine Gluconate Cloth  6 each Topical Daily   insulin aspart  0-15 Units Subcutaneous Q4H   levETIRAcetam  500 mg Oral BID   metoprolol  tartrate  12.5 mg Oral BID   pantoprazole (PROTONIX) IV  40 mg Intravenous Q12H   polyethylene glycol  17 g Oral Daily   sodium bicarbonate  650 mg Oral TID   sodium chloride flush  10-40 mL Intracatheter Q12H   sucralfate  1 g Oral TID WC & HS   Continuous Infusions:  PRN Meds:.acetaminophen **OR** acetaminophen, melatonin, polyethylene glycol, prochlorperazine, sodium chloride flush   Anti-infectives (From admission, onward)    Start     Dose/Rate Route Frequency Ordered Stop   02/26/23 2200  cefdinir (OMNICEF) capsule 300 mg  Status:  Discontinued        300 mg Oral 2 times daily 02/26/23 2038 02/26/23 2041      Subjective: [name] with no overnight events reported; no overt bleeding appreciated.  Hemodynamically stable and demonstrating good saturation on room air.  Patient reports to be hungry while waiting for endoscopic evaluation.  Objective: Vitals:   03/06/23 1340 03/06/23 1530 03/06/23 1545 03/06/23 1617  BP: 125/83 (!) 86/51 104/63 114/75  Pulse: (!) 110 95 (!) 108   Resp: 12 14 11 18   Temp: 97.9 F (36.6 C) 97.7 F (36.5 C)  98.1 F (36.7 C)  TempSrc: Oral   Oral  SpO2: 100% 100% 100% 100%  Weight: 56 kg     Height: 5\' 4"  (1.626 m)       Intake/Output Summary (Last 24 hours) at 03/06/2023 1659 Last data filed at 03/06/2023 1521 Gross per 24 hour  Intake 710 ml  Output 1650 ml  Net -940 ml    Filed Weights   02/26/23 1434 02/28/23 1307 03/06/23 1340  Weight: 55.9 kg 56 kg 56 kg   Physical Exam General exam: Alert, awake, following commands appropriately and in no major distress.  No overnight events.  No overt bleeding reported. Respiratory system: Clear to auscultation. Respiratory effort normal.  Good saturation on room air. Cardiovascular system:RRR. No rubs or gallops; no JVD. Gastrointestinal system: Abdomen is nondistended, soft and nontender. No organomegaly or masses felt. Normal bowel sounds heard. Central nervous system: Alert and  oriented. No focal neurological deficits. Extremities: No cyanosis, clubbing or edema.  Skin: No petechiae. Psychiatry: Judgement and insight appear normal. Mood & affect appropriate.   Data Reviewed: I have personally reviewed following labs and imaging studies  CBC: Recent Labs  Lab 03/02/23 0459 03/03/23 0523 03/04/23 0510 03/05/23 0436 03/06/23 0427  WBC 7.6 12.1* 22.9* 18.7* 10.1  HGB 10.2* 9.2* 10.6* 9.6* 9.3*  HCT 30.8* 28.4* 32.5* 28.9* 28.1*  MCV 92.2 94.0 94.2 93.2 93.7  PLT 136* 134* 177 170 135*   Basic Metabolic Panel: Recent Labs  Lab 03/01/23 0503 03/02/23 0459 03/04/23 0510 03/05/23 0436 03/06/23 0427  NA 137 135 131* 131* 132*  K 4.9 4.8 4.6 4.2 4.3  CL 113* 111 108 108 108  CO2 15* 18* 17* 18* 18*  GLUCOSE 139* 112* 64* 50* 62*  BUN 60* 57* 55* 52* 48*  CREATININE 2.00* 2.02* 1.92* 1.73* 1.50*  CALCIUM 7.7* 8.1*  8.1* 8.3* 8.2*  PHOS  --   --   --   --  2.3*   GFR: Estimated Creatinine Clearance: 30.1 mL/min (A) (by C-G formula based on SCr of 1.5 mg/dL (H)).  Liver Function Tests: Recent Labs  Lab 03/04/23 0510 03/06/23 0427  AST 25  --   ALT 14  --   ALKPHOS 82  --   BILITOT 0.5  --   PROT 4.6*  --   ALBUMIN 1.9* 1.7*   Radiology Studies: DG Abd 1 View  Result Date: 03/06/2023 CLINICAL DATA:  Retained foreign body. EXAM: ABDOMEN - 1 VIEW COMPARISON:  03/05/2023. FINDINGS: The bowel gas pattern is normal. A moderate amount of retained stool is present in the colon. Cholecystectomy clips are noted in the right upper quadrant. A 1.1 cm radiopaque density projects over the right lower quadrant which was seen on the prior exam. Extensive vascular calcifications are noted in the left upper quadrant, pelvis, and bilateral lower extremities. Fixation hardware is noted in the proximal right femur. Lumbar spinal fusion hardware is noted. IMPRESSION: 1. Radiopaque density in the right lower quadrant measuring 1.1 cm, unchanged from the prior exam,  possible endoscopy capsule. 2. Nonobstructive bowel-gas pattern. 3. Moderate amount of retained stool in the colon. Electronically Signed   By: Thornell Sartorius M.D.   On: 03/06/2023 04:55   DG Abd 1 View  Result Date: 03/05/2023 CLINICAL DATA:  Retained foreign body. Retained capsule from yesterday. EXAM: ABDOMEN - 1 VIEW COMPARISON:  CT abdomen/pelvis 02/17/2023. FINDINGS: No dilated loops of small bowel are demonstrated to suggest small bowel obstruction. Moderate colonic stool burden. A 1.7 cm dense object projects in the region of the right lower quadrant of the abdomen/right hemipelvis (see annotations on images). Surgical clips within the right upper quadrant of the abdomen. Lumbar posterior spinal fusion construct. Prior ORIF of the right femur, incompletely imaged.No acute osseous abnormality identified. IMPRESSION: 1. 1.7 cm dense object projecting in the region of the right lower quadrant of the abdomen/right hemipelvis, likely reflecting the reported endoscopy capsule. 2. Nonobstructive bowel gas pattern. 3. Moderate colonic stool burden. Electronically Signed   By: Jackey Loge D.O.   On: 03/05/2023 14:05    Scheduled Meds:  allopurinol  100 mg Oral Daily   budesonide  9 mg Oral Daily   Chlorhexidine Gluconate Cloth  6 each Topical Daily   insulin aspart  0-15 Units Subcutaneous Q4H   levETIRAcetam  500 mg Oral BID   metoprolol tartrate  12.5 mg Oral BID   pantoprazole (PROTONIX) IV  40 mg Intravenous Q12H   polyethylene glycol  17 g Oral Daily   sodium bicarbonate  650 mg Oral TID   sodium chloride flush  10-40 mL Intracatheter Q12H   sucralfate  1 g Oral TID WC & HS   Continuous Infusions:   LOS: 6 days   Vassie Loll M.D on 03/06/2023 at 11:45 am  Go to [email or web address] - for contact info  [facility] - Office  [phone]  If 7PM-7AM, please contact night-coverage [email or web address] 03/06/2023, 4:59 PM

## 2023-03-06 NOTE — Op Note (Addendum)
[facility] Patient Name: Heather [name] Procedure Date: 03/06/2023 2:39 PM MRN: [id number] Date of Birth: 01-25-1951 Attending MD: Heather [name] , MD, [id number] CSN: [id number] Age: 72 Admit Type: Inpatient Procedure:                Small bowel enteroscopy Indications:              Obscure gastrointestinal bleeding Providers:                Heather Pac, MD, Heather Celestine, RN, Heather [name] Referring MD:              Medicines:                Propofol per Anesthesia Complications:            No immediate complications. Estimated Blood Loss:     Estimated blood loss: none. Procedure:                Pre-Anesthesia Assessment:                           - Prior to the procedure, a History and Physical                            was performed, and patient medications and                            allergies were reviewed. The patient's tolerance of                            previous anesthesia was also reviewed. The risks                            and benefits of the procedure and the sedation                            options and risks were discussed with the patient.                            All questions were answered, and informed consent                            was obtained. Prior Anticoagulants: The patient has                            taken no anticoagulant or antiplatelet agents. ASA                            Grade Assessment: III - A patient with severe                            systemic disease. After reviewing the risks and  benefits, the patient was deemed in satisfactory                            condition to undergo the procedure.                           After obtaining informed consent, the endoscope was                            passed under direct vision. Throughout the                            procedure, the patient's blood pressure, pulse, and                            oxygen  saturations were monitored continuously. The                            [id number] ([id number]) was introduced through the                            mouth and advanced to the proximal jejunum. The                            small bowel enteroscopy was accomplished without                            difficulty. The patient tolerated the procedure                            well. Scope In: 3:03:00 PM Scope Out: 3:22:09 PM Total Procedure Duration: 0 hours 19 minutes 9 seconds  Findings:      Stomach empty gastric mucosa appeared grossly normal. Pylorus patent.       Examination of the bulb and second portion revealed previously noted       areas of ulceration appear to be healing without any bleeding stigmata.       Residual hemostasis clip identified.. Pediatric colonoscope advanced       well into the small bowel to the level of what I felt was the proximal       jejunum. Could not go further. Identified a couple of areas of clot       freely floating in mucus. Multiple skip areas of erosions involving the       tips of the folds. See photos. No out and out ulcer or sentinel bleeding       lesion seen. Impression:               - Mild esophagitis. Normal-appearing stomach.                            Duodenal ulceration appears much improved, healing.                            Small bowel tip erosions and friability extending  down to the proximal jejunum. No treatable lesion                            found.                           -Clinical scenario not behaving at all like                            garden-variety peptic ulcer disease. At this point,                            I think it comes down to either Crohn's disease of                            the small intestine or vascular                            insufficiency/ischemia. I reviewed the last                            noncontrast abdominal CT with Dr. Tyron [name]. Patient                             has a diminutive plaqued IMA; marked plaquing of                            the SMA and a diminutive appearing celiac artery.                            In this clinical scenario, this does raise the                            possibility of ischemia as the cause of small bowel                            ulceration. Moderate Sedation:      Moderate (conscious) sedation was personally administered by an       anesthesia professional. The following parameters were monitored: oxygen       saturation, heart rate, blood pressure, respiratory rate, EKG, adequacy       of pulmonary ventilation, and response to care. Recommendation:           - Return patient to hospital ward for observation.                            Full liquid diet. Trend H&H. Mesenteric Dopplers                            tomorrow; if study is suspicious, would recommend                            patient going down to [facility] [place] for selective  angiography to further define vascular anatomy,                            utilizing IV contrast efficiently..                           -Will also go ahead and add Entocort 9 mg daily to                            her regimen to cover for the possibility of Crohn's                            enteritis (allergy to Fluocinolone - swelling in                            the record). Discussed with pharmacy. It is felt                            that Entocort could be utilized in this setting                            with usual clinical vigilance. At patient request,                            I called Heather [name] at [phone]?reviewed my                            findings and recommendations at length. His                            questions were answered.                           -Further recommendations to follow. Procedure Code(s):        --- Professional ---                           [id number], Small intestinal endoscopy, enteroscopy                             beyond second portion of duodenum, not including                            ileum; diagnostic, including collection of                            specimen(s) by brushing or washing, when performed                            (separate procedure) Diagnosis Code(s):        --- Professional ---                           K92.2, Gastrointestinal hemorrhage, unspecified CPT copyright 2022 [employer]. All rights reserved. The  codes documented in this report are preliminary and upon coder review may  be revised to meet current compliance requirements. Gerrit Friends. Jissel Slavens, MD Heather Pac, MD 03/06/2023 4:29:20 PM This report has been signed electronically. Number of Addenda: 0

## 2023-03-07 ENCOUNTER — Inpatient Hospital Stay (HOSPITAL_COMMUNITY): Payer: Medicare PPO

## 2023-03-07 DIAGNOSIS — K921 Melena: Secondary | ICD-10-CM | POA: Diagnosis not present

## 2023-03-07 DIAGNOSIS — I159 Secondary hypertension, unspecified: Secondary | ICD-10-CM | POA: Diagnosis not present

## 2023-03-07 DIAGNOSIS — D62 Acute posthemorrhagic anemia: Secondary | ICD-10-CM | POA: Diagnosis not present

## 2023-03-07 DIAGNOSIS — K269 Duodenal ulcer, unspecified as acute or chronic, without hemorrhage or perforation: Secondary | ICD-10-CM | POA: Diagnosis not present

## 2023-03-07 DIAGNOSIS — Z8719 Personal history of other diseases of the digestive system: Secondary | ICD-10-CM | POA: Diagnosis not present

## 2023-03-07 DIAGNOSIS — K922 Gastrointestinal hemorrhage, unspecified: Secondary | ICD-10-CM | POA: Diagnosis not present

## 2023-03-07 LAB — BASIC METABOLIC PANEL
Anion gap: 7 (ref 5–15)
BUN: 46 mg/dL — ABNORMAL HIGH (ref 8–23)
CO2: 18 mmol/L — ABNORMAL LOW (ref 22–32)
Calcium: 8.3 mg/dL — ABNORMAL LOW (ref 8.9–10.3)
Chloride: 107 mmol/L (ref 98–111)
Creatinine, Ser: 1.55 mg/dL — ABNORMAL HIGH (ref 0.44–1.00)
GFR, Estimated: 36 mL/min — ABNORMAL LOW (ref >60)
Glucose, Bld: 353 mg/dL — ABNORMAL HIGH (ref 70–99)
Potassium: 5 mmol/L (ref 3.5–5.1)
Sodium: 132 mmol/L — ABNORMAL LOW (ref 135–145)

## 2023-03-07 LAB — CBC
HCT: 29.2 % — ABNORMAL LOW (ref 36.0–46.0)
Hemoglobin: 9.7 g/dL — ABNORMAL LOW (ref 12.0–15.0)
MCH: 31.1 pg (ref 26.0–34.0)
MCHC: 33.2 g/dL (ref 30.0–36.0)
MCV: 93.6 fL (ref 80.0–100.0)
Platelets: 189 10*3/uL (ref 150–400)
RBC: 3.12 MIL/uL — ABNORMAL LOW (ref 3.87–5.11)
RDW: 15.7 % — ABNORMAL HIGH (ref 11.5–15.5)
WBC: 8.8 10*3/uL (ref 4.0–10.5)
nRBC: 0 % (ref 0.0–0.2)

## 2023-03-07 LAB — GLUCOSE, CAPILLARY
Glucose-Capillary: 172 mg/dL — ABNORMAL HIGH (ref 70–99)
Glucose-Capillary: 278 mg/dL — ABNORMAL HIGH (ref 70–99)
Glucose-Capillary: 302 mg/dL — ABNORMAL HIGH (ref 70–99)
Glucose-Capillary: 305 mg/dL — ABNORMAL HIGH (ref 70–99)
Glucose-Capillary: 247 mg/dL — ABNORMAL HIGH (ref 70–99)

## 2023-03-07 MED ORDER — ENSURE ENLIVE PO LIQD
237.0000 mL | Freq: Two times a day (BID) | ORAL | Status: DC
  Administered 2023-03-08 (×3): 237 mL via ORAL

## 2023-03-07 NOTE — Progress Notes (Addendum)
Gastroenterology Progress Note   Referring Provider: No ref. provider found Primary Care Physician:  [name], MD Primary Gastroenterologist:  Dr. [name] Payor  Patient ID: Heather [name]; [id number]; July 25, 1951   Subjective:    Feels hungry. Wishes for more solid food. No abdominal pain. Two BMs last 24 hours.  Objective:   Vital signs in last 24 hours: Temp:  [97.7 F (36.5 C)-98.1 F (36.7 C)] 97.9 F (36.6 C) (05/09 0356) Pulse Rate:  [56-110] 100 (05/09 0356) Resp:  [11-20] 18 (05/09 0356) BP: (86-125)/(51-83) 113/52 (05/09 0356) SpO2:  [100 %] 100 % (05/09 0356) Weight:  [56 kg] 56 kg (05/08 1340) Last BM Date : 03/05/23 General:   Alert,  Well-developed, well-nourished, pleasant and cooperative in NAD Head:  Normocephalic and atraumatic. Eyes:  Sclera clear, no icterus.   Abdomen:  Soft, nontender and nondistended.  Normal bowel sounds, without guarding, and without rebound.   Extremities:  Without clubbing, deformity or edema. Neurologic:  Alert   grossly normal neurologically. Skin:  Intact without significant lesions or rashes. Psych:  Alert and cooperative. Normal mood and affect.  Intake/Output from previous day: 05/08 0701 - 05/09 0700 In: 590 [P.O.:240; I.V.:350] Out: 500 [Urine:500] Intake/Output this shift: No intake/output data recorded.  Lab Results: CBC Recent Labs    03/05/23 0436 03/06/23 0427 03/07/23 0952  WBC 18.7* 10.1 8.8  HGB 9.6* 9.3* 9.7*  HCT 28.9* 28.1* 29.2*  MCV 93.2 93.7 93.6  PLT 170 135* 189   BMET Recent Labs    03/05/23 0436 03/06/23 0427  NA 131* 132*  K 4.2 4.3  CL 108 108  CO2 18* 18*  GLUCOSE 50* 62*  BUN 52* 48*  CREATININE 1.73* 1.50*  CALCIUM 8.3* 8.2*   LFTs Recent Labs    03/06/23 0427  ALBUMIN 1.7*   No results for input(s): "LIPASE" in the last 72 hours. PT/INR No results for input(s): "LABPROT", "INR" in the last 72 hours.       Imaging Studies: DG Abd 1 View  Result Date:  03/06/2023 CLINICAL DATA:  Retained foreign body. EXAM: ABDOMEN - 1 VIEW COMPARISON:  03/05/2023. FINDINGS: The bowel gas pattern is normal. A moderate amount of retained stool is present in the colon. Cholecystectomy clips are noted in the right upper quadrant. A 1.1 cm radiopaque density projects over the right lower quadrant which was seen on the prior exam. Extensive vascular calcifications are noted in the left upper quadrant, pelvis, and bilateral lower extremities. Fixation hardware is noted in the proximal right femur. Lumbar spinal fusion hardware is noted. IMPRESSION: 1. Radiopaque density in the right lower quadrant measuring 1.1 cm, unchanged from the prior exam, possible endoscopy capsule. 2. Nonobstructive bowel-gas pattern. 3. Moderate amount of retained stool in the colon. Electronically Signed   By: Thornell Sartorius M.D.   On: 03/06/2023 04:55   DG Abd 1 View  Result Date: 03/05/2023 CLINICAL DATA:  Retained foreign body. Retained capsule from yesterday. EXAM: ABDOMEN - 1 VIEW COMPARISON:  CT abdomen/pelvis 02/17/2023. FINDINGS: No dilated loops of small bowel are demonstrated to suggest small bowel obstruction. Moderate colonic stool burden. A 1.7 cm dense object projects in the region of the right lower quadrant of the abdomen/right hemipelvis (see annotations on images). Surgical clips within the right upper quadrant of the abdomen. Lumbar posterior spinal fusion construct. Prior ORIF of the right femur, incompletely imaged.No acute osseous abnormality identified. IMPRESSION: 1. 1.7 cm dense object projecting in the region of the right lower  quadrant of the abdomen/right hemipelvis, likely reflecting the reported endoscopy capsule. 2. Nonobstructive bowel gas pattern. 3. Moderate colonic stool burden. Electronically Signed   By: Jackey Loge D.O.   On: 03/05/2023 14:05   [place] EKG SITE RITE  Result Date: 02/27/2023 If Site Rite image not attached, placement could not be confirmed due to current  cardiac rhythm.  CT ABDOMEN PELVIS WO CONTRAST  Result Date: 02/17/2023 CLINICAL DATA:  Concern for bowel obstruction. Patient reportedly has red stool. EXAM: CT ABDOMEN AND PELVIS WITHOUT CONTRAST TECHNIQUE: Multidetector CT imaging of the abdomen and pelvis was performed following the standard protocol without IV contrast. RADIATION DOSE REDUCTION: This exam was performed according to the departmental dose-optimization program which includes automated exposure control, adjustment of the mA and/or kV according to patient size and/or use of iterative reconstruction technique. COMPARISON:  CT abdomen pelvis dated 02/04/2023. FINDINGS: Lower chest: Moderate bilateral pleural effusions with associated atelectasis are partially imaged. The heart is mildly enlarged. The blood pool is hypoattenuating, suggestive of anemia. Hepatobiliary: No focal liver abnormality is seen. Status post cholecystectomy. No biliary dilatation. Pancreas: Atrophic. A 1.8 cm low-density lesion in the pancreatic tail appears unchanged. No pancreatic ductal dilatation or surrounding inflammatory changes. Spleen: Normal in size without focal abnormality. Adrenals/Urinary Tract: Adrenal glands are unremarkable. Bilateral punctate calcifications in both kidneys may represent nonobstructing calculi measuring up to 2 mm and/or vascular calcifications. No suspicious focal lesion or hydronephrosis on either side. Bladder is unremarkable. Stomach/Bowel: Stomach is within normal limits. The appendix is not definitely identified, however no pericecal inflammatory changes are noted to suggest acute appendicitis. There is a mild bowel wall thickening and mucosal enhancement involving portions of the sigmoid colon and rectum. No significant surrounding inflammatory changes. No bowel obstruction. Vascular/Lymphatic: Aortic atherosclerosis. No enlarged abdominal or pelvic lymph nodes. Reproductive: Status post hysterectomy. No adnexal masses. Other: No  abdominal wall hernia or abnormality. No abdominopelvic ascites. Anasarca is noted Musculoskeletal: There has been slight progression since 02/04/2023 of a T12 compression deformity with approximately 50% height loss centrally and 3 mm retropulsion into the central canal. Fixation hardware is seen in the right femur. Fixation hardware in the lumbar spine from L1-L4. IMPRESSION: 1. Mild bowel wall thickening and mucosal enhancement involving portions of the sigmoid colon and rectum may reflect proctocolitis. No bowel obstruction. 2. Moderate bilateral pleural effusions with associated atelectasis. 3. Slight progression since 02/04/2023 of a T12 compression deformity with approximately 50% height loss centrally and 3 mm retropulsion into the central canal. 4. Low-density pancreatic lesion. When the patient is clinically stable and able to follow directions and hold their breath (preferably as an outpatient) further evaluation with dedicated abdominal MRI could be considered for further characterization. Aortic Atherosclerosis (ICD10-I70.0). Electronically Signed   By: Romona Curls M.D.   On: 02/17/2023 17:36   DG Chest Port 1 View  Result Date: 02/05/2023 CLINICAL DATA:  Placement of central venous catheter EXAM: PORTABLE CHEST 1 VIEW COMPARISON:  Previous studies including the examination done on 12/15/2022 FINDINGS: Cardiac size is within normal limits. Apparent shift of mediastinum to the right may be due to rotation. There is interval placement of central venous catheter through the right internal jugular with its tip in right atrium. There is no pleural effusion or pneumothorax. Small linear densities are seen in right lower lung field. Surgical fusion is seen in cervical spine, upper thoracic spine and lumbar spine. Surgical clips are seen in gallbladder fossa. There is deformity in right clavicle consistent with  old healed fracture. IMPRESSION: Tip of right IJ central venous catheter is seen in right  atrium. There is no pneumothorax. Linear densities in left lower lung field may suggest subsegmental atelectasis or scarring. Electronically Signed   By: Ernie Avena M.D.   On: 02/05/2023 12:55  [2 weeks]  Assessment:   72 y.o. female with a history of small bowel Crohn's disease diagnosed in Jan 2022 and previously on Humira, history of pancreatitis possibly related to hypertriglyceridemia and use of Victoza, CKD, depression, diabetes, GERD, anxiety, IBS, hyperlipidemia, stroke, recent admission with EGD on 4/23 showing duodenal ulcers and repeat EGD 4/26 with visible vessel s/p clip placement, now admitted again with rectal bleeding.   This current admission with acute on chronic anemia and Hgb dropping to 6.5, s/p 2 units PRBCs on 5/2. EGD on 5/2 with duodenal ulceration improving, colonoscopy 5/3 with normal TI, sigmoid diverticulosis.  CTA had been planned  due to drift in Hgb and melena; however, she was not a candidate due to GFR. Hgb yesterday stable at 9.3. recevied two units of prbcs this admission on 02/28/23.  Small bowel capsule completed showing esophagitis, gastritis, ulcerated, erythematous small bowel, most severe is the proximal small bowel. Evidence of bleeding with wisps and droplets of red blood throughout the small bowel, but no specific bleeding lesion identified, scattered erosions and/or small AMVs. Study was incomplete. Capsule did not make it to the cecum.   Small bowel enteroscopy Mar 06, 2023: Mild esophagitis.  Normal-appearing stomach.  Duodenal ulceration appears much improved, healing.  Small bowel tip erosions and friability extending down to the proximal jejunum.  No treatable lesion found.    Clinical scenario not consistent with typical peptic ulcer disease.  Crohn's disease of the small intestine or vascular insufficiency/ischemia could be playing a role.  Noncontrast abdominal CT reviewed with radiology, patient felt to have diminutive plaque at the IMA, marked  plaquing of the SMA diminutive appearing celiac artery.  Mesenteric Dopplers planned for today.  Entocort 9 mg daily added to cover for possibility of Crohn's enteritis.  Allergy to fluocinolone in the record, discussed with pharmacy.  It was felt that Entocort could be utilized in the setting watching for any particular side effects.    Plan:   Follow up mesenteric arteries ultrasound Continue entocort 9mg  daily. Continue PPI BID. No NSAIDs. Repeat abd xray in 2 weeks to make sure capsule passed.    LOS: 7 days   Leanna Battles. [facility] [phone] 5/9/202410:06 AM

## 2023-03-07 NOTE — Progress Notes (Signed)
PROGRESS NOTE     Heather [name], is a 72 y.o. female, DOB - August 07, 1951, ZOX:[id number]  Admit date - 02/26/2023   Admitting Physician [name] [name], MD  Outpatient Primary MD for the patient is [name], MD  LOS - 7  Chief Complaint  Patient presents with   Rectal Bleeding        Brief Narrative:  72 y.o. female with medical history significant for GI bleed, CKD 4, CVA, D CHF,  DM, HTN with history of peptic ulcer disease with recent bleeding from duodenal ulcer,, small bowel Crohn's disease admitted with small-volume bright red blood per rectum on 02/26/2023    -Assessment and Plan: 1)Acute GI bleed -Patient's hemoglobin was 3.2 on 02/17/2023--patient required transfusion of PRBC and endoscopy x 2 during that hospitalization -Discharge Hgb on 02/25/2023 was 9.6 -Hemoglobin currently around 9  S/p recent  EGD and flex sig 4/23 and repeat EGD 4/26 due to persistent bleeding Initial EGD from 02/19/2023 showed esophagitis with ulcers as well extensive duodenal ulceration on initial EGD.  Visible vessel treated with epinephrine and hemostasis clip.  She developed recurrent bleeding  - underwent a repeat EGD on 02/22/23 which revealed a visible vessel at a different site in the ulcer crater requiring epinephrine and clip placement.   -H. pylori negative - Limited FS on 02/22/2023 was with  poor prep nondiagnostic. -GI consult appreciated -Patient has received 2 units of PRBC this admission so far -Colonoscopy on 03/01/2023 shows no presence of inflammation hematin or active bleeding was present thorughout the colon.  -c/n IV Protonix and Carafate -Hgb was 11 after transfusion of 2 units of packed blood cells on 02/28/2023 - -03/05/23 -Unable to do CT angio GI bleed protocol due to renal function -Enteroscopy evaluation on 03/06/2023 demonstrating friable mucosa's in the small intestine and son erosions area given concerns for mesenteric ischemia. -Given possible component of chronic  disease GI service has recommended initiation of budenoside 9 mg daily and and mesenteric Doppler evaluation to further decide on evaluation and treatment. -Full liquid diet has been recommended by GI. -Continue to follow GI service recommendations.  2)Chronic diastolic CHF (congestive heart failure) (HCC) Stable and compensated.  Last echo 11/2022, EF of 60 to 65%, grade 1 DD.  Not on diuretic. -Continue to follow daily weights and strict I's and O's -Currently stable and compensated.  3)Diabetes mellitus (HCC) -A1c 6.4 reflecting good diabetic control PTA -Continue to hold Lantus until oral intake is more reliable -Continue to follow CBGs and use sliding scale insulin.  I will avoid coverage unless consistently above 200.  4)HTN -Stable overall. -Continue current antihypertensive agents.  5)Crohn's disease of small intestine without complication (HCC) Stable.  Previously treated with Humira currently not on medications for this. -Recent biopsy during endoscopy was nondiagnostic -Continue to have follow-up with gastroenterology service. -Following enteroscopy findings with the 9 mg daily has been started -Continue PPI twice a day.  6) CKD stage -IV -Renal function currently close to baseline; continue to follow renal function trend. -Creatinine is down to 1.73 from 2.43 on 02/25/2023 -Renally adjust medications, avoid nephrotoxic agents / dehydration  / hypotension -Continue to follow renal function trend.  7)Mild Hyponatremia, not POA----suspect dehydration  -Continue to follow electrolytes intermittently -Continue to obtain adequate hydration.  Status is: Inpatient   Disposition: The patient is from: Home              Anticipated d/c is to: Home  Anticipated d/c date is: 1 day              Patient currently is not medically stable to d/c. Barriers: Not Clinically Stable-   Code Status :  -  Code Status: Full Code   Family Communication:     (patient is  alert, awake and coherent)  -Patient's husband updated  DVT Prophylaxis  :   - SCDs  SCDs Start: 02/26/23 2036   Lab Results  Component Value Date   PLT 189 03/07/2023   Inpatient Medications  Scheduled Meds:  allopurinol  100 mg Oral Daily   budesonide  9 mg Oral Daily   Chlorhexidine Gluconate Cloth  6 each Topical Daily   [START ON 03/08/2023] feeding supplement  237 mL Oral BID BM   insulin aspart  0-15 Units Subcutaneous Q4H   levETIRAcetam  500 mg Oral BID   metoprolol tartrate  12.5 mg Oral BID   pantoprazole (PROTONIX) IV  40 mg Intravenous Q12H   polyethylene glycol  17 g Oral Daily   sodium bicarbonate  650 mg Oral TID   sodium chloride flush  10-40 mL Intracatheter Q12H   sucralfate  1 g Oral TID WC & HS   Continuous Infusions:  PRN Meds:.acetaminophen **OR** acetaminophen, melatonin, polyethylene glycol, prochlorperazine, sodium chloride flush   Anti-infectives (From admission, onward)    Start     Dose/Rate Route Frequency Ordered Stop   02/26/23 2200  cefdinir (OMNICEF) capsule 300 mg  Status:  Discontinued        300 mg Oral 2 times daily 02/26/23 2038 02/26/23 2041      Subjective: [name] no overt bleeding; no overnight events reported.  Patient has tolerated well enteroscopy evaluation on 03/06/2023.  So far tolerating full liquid diet.  Objective: Vitals:   03/06/23 1545 03/06/23 1617 03/06/23 2153 03/07/23 0356  BP: 104/63 114/75 119/77 (!) 113/52  Pulse: (!) 108  (!) 56 100  Resp: 11 18 20 18   Temp:  98.1 F (36.7 C) 98 F (36.7 C) 97.9 F (36.6 C)  TempSrc:  Oral  Oral  SpO2: 100% 100% 100% 100%  Weight:      Height:        Intake/Output Summary (Last 24 hours) at 03/07/2023 1642 Last data filed at 03/07/2023 0900 Gross per 24 hour  Intake 440 ml  Output --  Net 440 ml    Filed Weights   02/26/23 1434 02/28/23 1307 03/06/23 1340  Weight: 55.9 kg 56 kg 56 kg   Physical Exam General exam: No fever, no chest pain, no nausea,  no vomiting, no overt bleeding.  No overnight events reported. Respiratory system: Clear to auscultation. Respiratory effort normal.  Good saturation on room air. Cardiovascular system:RRR.  No rubs no gallops, no JVD. Gastrointestinal system: Abdomen is nondistended, soft and nontender.  Positive bowel sounds appreciated. Central nervous system: No focal deficit. Extremities: No cyanosis, clubbing or edema. Skin: No petechiae. Psychiatry: Still no mood.  nt and insight appear normal. Mood & affect appropriate.   Data Reviewed: I have personally reviewed following labs and imaging studies  CBC: Recent Labs  Lab 03/03/23 0523 03/04/23 0510 03/05/23 0436 03/06/23 0427 03/07/23 0952  WBC 12.1* 22.9* 18.7* 10.1 8.8  HGB 9.2* 10.6* 9.6* 9.3* 9.7*  HCT 28.4* 32.5* 28.9* 28.1* 29.2*  MCV 94.0 94.2 93.2 93.7 93.6  PLT 134* 177 170 135* 189   Basic Metabolic Panel: Recent Labs  Lab 03/02/23 0459 03/04/23 0510 03/05/23 0436  03/06/23 0427 03/07/23 0952  NA 135 131* 131* 132* 132*  K 4.8 4.6 4.2 4.3 5.0  CL 111 108 108 108 107  CO2 18* 17* 18* 18* 18*  GLUCOSE 112* 64* 50* 62* 353*  BUN 57* 55* 52* 48* 46*  CREATININE 2.02* 1.92* 1.73* 1.50* 1.55*  CALCIUM 8.1* 8.1* 8.3* 8.2* 8.3*  PHOS  --   --   --  2.3*  --    GFR: Estimated Creatinine Clearance: 29.2 mL/min (A) (by C-G formula based on SCr of 1.55 mg/dL (H)).  Liver Function Tests: Recent Labs  Lab 03/04/23 0510 03/06/23 0427  AST 25  --   ALT 14  --   ALKPHOS 82  --   BILITOT 0.5  --   PROT 4.6*  --   ALBUMIN 1.9* 1.7*   Radiology Studies: [place] MESENTERIC ARTERIES  Result Date: 03/07/2023 CLINICAL DATA:  Anemia Rectal bleeding Hypertension Stroke Diabetes Hyperlipidemia EXAM: [place] MESENTERIC ARTERIAL DOPPLER COMPARISON:  CT abdomen pelvis without contrast 02/17/2023 FINDINGS: Celiac axis: 99 cm/sec Celiac axis with inspiration: 102 cm/sec Celiac axis with expiration: 97 cm/sec Splenic artery: 93 cm/sec Hepatic artery:  110 cm/sec SMA: 242 cm/sec IMA: 112 cm/sec Aorta: 145 cm/sec Aortic size: 1.8 cm proximally, 1.4 cm in the mid segment and 1.2 cm distally IMPRESSION: 1. No hemodynamically significant stenosis of the celiac, superior mesenteric, or inferior mesenteric arteries. 2. Atherosclerotic calcifications seen throughout the abdominal aorta. Electronically Signed   By: Acquanetta Belling M.D.   On: 03/07/2023 15:25   DG Abd 1 View  Result Date: 03/06/2023 CLINICAL DATA:  Retained foreign body. EXAM: ABDOMEN - 1 VIEW COMPARISON:  03/05/2023. FINDINGS: The bowel gas pattern is normal. A moderate amount of retained stool is present in the colon. Cholecystectomy clips are noted in the right upper quadrant. A 1.1 cm radiopaque density projects over the right lower quadrant which was seen on the prior exam. Extensive vascular calcifications are noted in the left upper quadrant, pelvis, and bilateral lower extremities. Fixation hardware is noted in the proximal right femur. Lumbar spinal fusion hardware is noted. IMPRESSION: 1. Radiopaque density in the right lower quadrant measuring 1.1 cm, unchanged from the prior exam, possible endoscopy capsule. 2. Nonobstructive bowel-gas pattern. 3. Moderate amount of retained stool in the colon. Electronically Signed   By: Thornell Sartorius M.D.   On: 03/06/2023 04:55    Scheduled Meds:  allopurinol  100 mg Oral Daily   budesonide  9 mg Oral Daily   Chlorhexidine Gluconate Cloth  6 each Topical Daily   [START ON 03/08/2023] feeding supplement  237 mL Oral BID BM   insulin aspart  0-15 Units Subcutaneous Q4H   levETIRAcetam  500 mg Oral BID   metoprolol tartrate  12.5 mg Oral BID   pantoprazole (PROTONIX) IV  40 mg Intravenous Q12H   polyethylene glycol  17 g Oral Daily   sodium bicarbonate  650 mg Oral TID   sodium chloride flush  10-40 mL Intracatheter Q12H   sucralfate  1 g Oral TID WC & HS   Continuous Infusions:   LOS: 7 days   Vassie Loll M.D on 03/07/2023 at 11:45 am  Go  to [email or web address] - for contact info  [facility] - Office  [phone]  If 7PM-7AM, please contact night-coverage [email or web address] 03/07/2023, 4:42 PM

## 2023-03-08 ENCOUNTER — Telehealth: Payer: Self-pay | Admitting: Gastroenterology

## 2023-03-08 DIAGNOSIS — K922 Gastrointestinal hemorrhage, unspecified: Secondary | ICD-10-CM | POA: Diagnosis not present

## 2023-03-08 DIAGNOSIS — Z8719 Personal history of other diseases of the digestive system: Secondary | ICD-10-CM | POA: Diagnosis not present

## 2023-03-08 DIAGNOSIS — K625 Hemorrhage of anus and rectum: Secondary | ICD-10-CM | POA: Diagnosis not present

## 2023-03-08 DIAGNOSIS — I159 Secondary hypertension, unspecified: Secondary | ICD-10-CM | POA: Diagnosis not present

## 2023-03-08 DIAGNOSIS — D62 Acute posthemorrhagic anemia: Secondary | ICD-10-CM | POA: Diagnosis not present

## 2023-03-08 LAB — GLUCOSE, CAPILLARY
Glucose-Capillary: 159 mg/dL — ABNORMAL HIGH (ref 70–99)
Glucose-Capillary: 161 mg/dL — ABNORMAL HIGH (ref 70–99)
Glucose-Capillary: 188 mg/dL — ABNORMAL HIGH (ref 70–99)
Glucose-Capillary: 212 mg/dL — ABNORMAL HIGH (ref 70–99)
Glucose-Capillary: 256 mg/dL — ABNORMAL HIGH (ref 70–99)
Glucose-Capillary: 303 mg/dL — ABNORMAL HIGH (ref 70–99)
Glucose-Capillary: 356 mg/dL — ABNORMAL HIGH (ref 70–99)
Glucose-Capillary: 392 mg/dL — ABNORMAL HIGH (ref 70–99)
Glucose-Capillary: 69 mg/dL — ABNORMAL LOW (ref 70–99)
Glucose-Capillary: 83 mg/dL (ref 70–99)

## 2023-03-08 MED ORDER — ALPRAZOLAM 0.25 MG PO TABS
0.2500 mg | ORAL_TABLET | Freq: Two times a day (BID) | ORAL | Status: DC | PRN
  Administered 2023-03-08 (×2): 0.25 mg via ORAL
  Filled 2023-03-08 (×2): qty 1

## 2023-03-08 MED ORDER — PROSOURCE PLUS PO LIQD
30.0000 mL | Freq: Two times a day (BID) | ORAL | Status: DC
  Administered 2023-03-08 – 2023-03-09 (×2): 30 mL via ORAL
  Filled 2023-03-08 (×2): qty 30

## 2023-03-08 MED ORDER — ADULT MULTIVITAMIN W/MINERALS CH
1.0000 | ORAL_TABLET | Freq: Every day | ORAL | Status: DC
  Administered 2023-03-08 – 2023-03-09 (×2): 1 via ORAL
  Filled 2023-03-08 (×2): qty 1

## 2023-03-08 NOTE — Progress Notes (Signed)
Gastroenterology Progress Note   Referring Provider: No ref. provider found Primary Care Physician:  [name], MD Primary Gastroenterologist:  Dr. [name] Payor  Patient ID: Heather [name]; [id number]; July 27, 1951    Subjective   Feeling well. Wants to eat more food. Requesting vanilla boost. Denies melena, abdominal pain, dizziness. Nursing staff denies any melena or brbpr.   Objective   Vital signs in last 24 hours Temp:  [97.9 F (36.6 C)-98 F (36.7 C)] 97.9 F (36.6 C) (05/10 0457) Pulse Rate:  [101-117] 117 (05/10 0457) Resp:  [16] 16 (05/10 0457) BP: (143)/(59-64) 143/59 (05/10 0457) SpO2:  [100 %] 100 % (05/10 0457) Last BM Date : 03/07/23  Physical Exam  General:   Alert and oriented, pleasant Head:  Normocephalic and atraumatic. Eyes:  No icterus, sclera clear. Conjuctiva pink.  Mouth:  Without lesions, mucosa pink and moist.  Abdomen:  Bowel sounds present, soft, non-tender, non-distended. No HSM or hernias noted. No rebound or guarding. No masses appreciated  Neurologic:  Alert and  oriented x4;  grossly normal neurologically. Skin:  Warm and dry, intact without significant lesions.  Psych:  Alert and cooperative. Normal mood and affect.  Intake/Output from previous day: 05/09 0701 - 05/10 0700 In: 900 [P.O.:900] Out: -  Intake/Output this shift: No intake/output data recorded.  Lab Results  Recent Labs    03/06/23 0427 03/07/23 0952  WBC 10.1 8.8  HGB 9.3* 9.7*  HCT 28.1* 29.2*  PLT 135* 189   BMET Recent Labs    03/06/23 0427 03/07/23 0952  NA 132* 132*  K 4.3 5.0  CL 108 107  CO2 18* 18*  GLUCOSE 62* 353*  BUN 48* 46*  CREATININE 1.50* 1.55*  CALCIUM 8.2* 8.3*   LFT Recent Labs    03/06/23 0427  ALBUMIN 1.7*   PT/INR No results for input(s): "LABPROT", "INR" in the last 72 hours. Hepatitis Panel No results for input(s): "HEPBSAG", "HCVAB", "HEPAIGM", "HEPBIGM" in the last 72 hours.  Studies/Results [place] MESENTERIC  ARTERIES  Result Date: 03/07/2023 CLINICAL DATA:  Anemia Rectal bleeding Hypertension Stroke Diabetes Hyperlipidemia EXAM: [place] MESENTERIC ARTERIAL DOPPLER COMPARISON:  CT abdomen pelvis without contrast 02/17/2023 FINDINGS: Celiac axis: 99 cm/sec Celiac axis with inspiration: 102 cm/sec Celiac axis with expiration: 97 cm/sec Splenic artery: 93 cm/sec Hepatic artery: 110 cm/sec SMA: 242 cm/sec IMA: 112 cm/sec Aorta: 145 cm/sec Aortic size: 1.8 cm proximally, 1.4 cm in the mid segment and 1.2 cm distally IMPRESSION: 1. No hemodynamically significant stenosis of the celiac, superior mesenteric, or inferior mesenteric arteries. 2. Atherosclerotic calcifications seen throughout the abdominal aorta. Electronically Signed   By: Acquanetta Belling M.D.   On: 03/07/2023 15:25   DG Abd 1 View  Result Date: 03/06/2023 CLINICAL DATA:  Retained foreign body. EXAM: ABDOMEN - 1 VIEW COMPARISON:  03/05/2023. FINDINGS: The bowel gas pattern is normal. A moderate amount of retained stool is present in the colon. Cholecystectomy clips are noted in the right upper quadrant. A 1.1 cm radiopaque density projects over the right lower quadrant which was seen on the prior exam. Extensive vascular calcifications are noted in the left upper quadrant, pelvis, and bilateral lower extremities. Fixation hardware is noted in the proximal right femur. Lumbar spinal fusion hardware is noted. IMPRESSION: 1. Radiopaque density in the right lower quadrant measuring 1.1 cm, unchanged from the prior exam, possible endoscopy capsule. 2. Nonobstructive bowel-gas pattern. 3. Moderate amount of retained stool in the colon. Electronically Signed   By: Charlestine Night.D.  On: 03/06/2023 04:55   DG Abd 1 View  Result Date: 03/05/2023 CLINICAL DATA:  Retained foreign body. Retained capsule from yesterday. EXAM: ABDOMEN - 1 VIEW COMPARISON:  CT abdomen/pelvis 02/17/2023. FINDINGS: No dilated loops of small bowel are demonstrated to suggest small bowel  obstruction. Moderate colonic stool burden. A 1.7 cm dense object projects in the region of the right lower quadrant of the abdomen/right hemipelvis (see annotations on images). Surgical clips within the right upper quadrant of the abdomen. Lumbar posterior spinal fusion construct. Prior ORIF of the right femur, incompletely imaged.No acute osseous abnormality identified. IMPRESSION: 1. 1.7 cm dense object projecting in the region of the right lower quadrant of the abdomen/right hemipelvis, likely reflecting the reported endoscopy capsule. 2. Nonobstructive bowel gas pattern. 3. Moderate colonic stool burden. Electronically Signed   By: Jackey Loge D.O.   On: 03/05/2023 14:05   [place] EKG SITE RITE  Result Date: 02/27/2023 If Site Rite image not attached, placement could not be confirmed due to current cardiac rhythm.  CT ABDOMEN PELVIS WO CONTRAST  Result Date: 02/17/2023 CLINICAL DATA:  Concern for bowel obstruction. Patient reportedly has red stool. EXAM: CT ABDOMEN AND PELVIS WITHOUT CONTRAST TECHNIQUE: Multidetector CT imaging of the abdomen and pelvis was performed following the standard protocol without IV contrast. RADIATION DOSE REDUCTION: This exam was performed according to the departmental dose-optimization program which includes automated exposure control, adjustment of the mA and/or kV according to patient size and/or use of iterative reconstruction technique. COMPARISON:  CT abdomen pelvis dated 02/04/2023. FINDINGS: Lower chest: Moderate bilateral pleural effusions with associated atelectasis are partially imaged. The heart is mildly enlarged. The blood pool is hypoattenuating, suggestive of anemia. Hepatobiliary: No focal liver abnormality is seen. Status post cholecystectomy. No biliary dilatation. Pancreas: Atrophic. A 1.8 cm low-density lesion in the pancreatic tail appears unchanged. No pancreatic ductal dilatation or surrounding inflammatory changes. Spleen: Normal in size without focal  abnormality. Adrenals/Urinary Tract: Adrenal glands are unremarkable. Bilateral punctate calcifications in both kidneys may represent nonobstructing calculi measuring up to 2 mm and/or vascular calcifications. No suspicious focal lesion or hydronephrosis on either side. Bladder is unremarkable. Stomach/Bowel: Stomach is within normal limits. The appendix is not definitely identified, however no pericecal inflammatory changes are noted to suggest acute appendicitis. There is a mild bowel wall thickening and mucosal enhancement involving portions of the sigmoid colon and rectum. No significant surrounding inflammatory changes. No bowel obstruction. Vascular/Lymphatic: Aortic atherosclerosis. No enlarged abdominal or pelvic lymph nodes. Reproductive: Status post hysterectomy. No adnexal masses. Other: No abdominal wall hernia or abnormality. No abdominopelvic ascites. Anasarca is noted Musculoskeletal: There has been slight progression since 02/04/2023 of a T12 compression deformity with approximately 50% height loss centrally and 3 mm retropulsion into the central canal. Fixation hardware is seen in the right femur. Fixation hardware in the lumbar spine from L1-L4. IMPRESSION: 1. Mild bowel wall thickening and mucosal enhancement involving portions of the sigmoid colon and rectum may reflect proctocolitis. No bowel obstruction. 2. Moderate bilateral pleural effusions with associated atelectasis. 3. Slight progression since 02/04/2023 of a T12 compression deformity with approximately 50% height loss centrally and 3 mm retropulsion into the central canal. 4. Low-density pancreatic lesion. When the patient is clinically stable and able to follow directions and hold their breath (preferably as an outpatient) further evaluation with dedicated abdominal MRI could be considered for further characterization. Aortic Atherosclerosis (ICD10-I70.0). Electronically Signed   By: Romona Curls M.D.   On: 02/17/2023 17:36  Assessment  72 y.o. female with a history of small bowel Crohn's diagnosed in January 2022 (previously on Humira), pancreatitis possibly related to hypertriglyceridemia and use of Victoza, CKD, depression, diabetes, GERD, IBS, anxiety, HLD, stroke, and recent admission for EGD on 4/23 showing duodenal ulcers, flex sig with melena, and repeat EGD 4/26 with visible vessel s/p clip placement and discharged 02/25/23 who was readmitted 4/30 with recurrent rectal bleeding.  Acute on chronic anemia and rectal bleeding: Hgb 9 on readmission (down from 9.6 on d/c).  Hemoglobin subsequently dropped to 6.7 on 5/2 requiring 2 units of PRBCs.  She underwent EGD on 5/2 with 1 ulceration improving, colonoscopy 5/3 with normal TI and sigmoid diverticulosis.  Plan was to complete a CTA however this was unavailable due to GFR.  Small bowel capsule showed esophagitis, gastritis, ulceration, erythematous small bowel which is most severe in the proximal small bowel with wisps and droplets of red blood throughout the small bowel without any specific bleeding lesion identified.  There were scattered erosions and/or small AVMs.  Study was incomplete as it did not make it to the cecum.  KUB 5/8 with radiopaque density in the right lower quadrant measuring 1.1 cm (likely capsule).  Hemoglobin has remained stable in the low to mid 9's.  No further evidence of melena.  She underwent small bowel enteroscopy 5/8 which revealed mild esophagitis, normal stomach, improved duodenal ulceration with healing and small bowel tic erosions and friability extending down to the proximal jejunum without any identified treatable lesion.  Presentation is not typical of peptic ulcer disease therefore findings suspected to be secondary to Crohn's disease or possible vascular ischemia.  Noncontrast abdominal CT felt to have diminutive plaque at the IMA, marked plaquing of the SMA, and diminutive appearing celiac artery.  Mesenteric Dopplers completed  yesterday 5/9 without any hemodynamically significant stenosis of the celiac, SMA, or inferior mesenteric arteries.  For now we will continue Entocort 9 mg daily given suspected Crohn's enteritis currently not experiencing any particular side effects despite her allergy to fluocinolone. Will continue this and PPI BID until she is seen by primary GI in 2-3 weeks.   Stable to discharge at this time from GI standpoint.   Plan / Recommendations  Advance to soft diet.  Continue Entocort 9 mg daily Continue PPI BID Continue carafate 1g TID with meals and bedtime.  Avoid NSAIDs Check H/H tomorrow KUB around 5/23 to ensure video capsule passed. CBC in 1 week (our office will arrange) Follow up with Dr. [name] Payor, primary GI in 2-3 weeks.      LOS: 8 days   03/08/2023, 10:13 AM   Brooke Bonito, MSN, FNP-BC, AGACNP-BC [facility]

## 2023-03-08 NOTE — Inpatient Diabetes Management (Signed)
Inpatient Diabetes Program Recommendations  AACE/ADA: New Consensus Statement on Inpatient Glycemic Control (2015)  Target Ranges:  Prepandial:   less than 140 mg/dL      Peak postprandial:   less than 180 mg/dL (1-2 hours)      Critically ill patients:  140 - 180 mg/dL   Lab Results  Component Value Date   GLUCAP 212 (H) 03/08/2023   HGBA1C 5.7 (H) 02/26/2023    Review of Glycemic Control  Latest Reference Range & Units 03/07/23 16:41 03/07/23 21:23 03/08/23 01:26 03/08/23 02:23 03/08/23 04:35 03/08/23 06:03 03/08/23 07:09  Glucose-Capillary 70 - 99 mg/dL 829 (H) 562 (H) 69 (L) 83 159 (H) 188 (H) 212 (H)  (H): Data is abnormally high (L): Data is abnormally low  Diabetes history: DM2 Outpatient Diabetes medications: Semglee 15 units QD, Novolog 2-5 units TID Current orders for Inpatient glycemic control: Novolog 0-15 units Q4H, Entocort 9 mg QD  Inpatient Diabetes Program Recommendations:    Patient is eating now.  Please consider:  Novolog 0-15 units TID and 0-5 units QHS  Will continue to follow while inpatient.  Thank you, Dulce Sellar, MSN, CDCES Diabetes Coordinator Inpatient Diabetes Program [phone] (team pager from 8a-5p)

## 2023-03-08 NOTE — Progress Notes (Signed)
Initial Nutrition Assessment  DOCUMENTATION CODES:   Not applicable  INTERVENTION:  Monitor for diet advancement per GI Ensure Enlive po BID, each supplement provides 350 kcal and 20 grams of protein. 30 ml ProSource Plus BID, each supplement provides 100 kcals and 15 grams protein.  MVI with minerals daily  NUTRITION DIAGNOSIS:   Increased nutrient needs related to acute illness as evidenced by estimated needs.  GOAL:   Patient will meet greater than or equal to 90% of their needs  MONITOR:   PO intake, Supplement acceptance, Diet advancement, Labs, Weight trends, Skin  REASON FOR ASSESSMENT:   LOS    ASSESSMENT:   Pt admitted with GIB. PMH significant for GIB, CKD 4, CVA, dCHF, crohn's disease, DM, HTN. Recently admitted 4/21-4/29 for GIB and severe anemia, s/p EGD with findings of esophageal ulcers and esophagitis, flex sig and repeat EGD d/t persistent bleeding.  5/8 - s/p small bowel enteroscopy- findings of mild esophagitis, improved duodenal ulcerations, Small bowel tip erosions and friability extending down to the proximal jejunum; full liquid diet  Pt has remained on restricted diet throughout admission (NPO/CLD/FLD).   Pt assessed by RD during prior admission. Attempted to call and speak with pt via phone call to room however no answer. Prior RD note reflects pt does not like Ensure. Reached out to RN as these have been ordered for pt. She has been consuming these supplements during this admission. Will continue with this order and adjust on follow up as necessary.   Meal completions: 5/9: 50% breakfast, 100% dinner 5/10: 100% breakfast  Admit weight: 55.9 kg Current weight: 56 kg  Medications: budesonide, SSI 0-15 units q4h, protonix, miralax, sodium bicarbonate, carafate  Labs: sodium 132, BUN 46, Cr 1.55, GFR 36, HgbA1c 5.7%, CBG's 69-256 x24 hours  NUTRITION - FOCUSED PHYSICAL EXAM: RD working remotely. Deferred to follow up. Pt noted to have muscle  and fat deficits on prior nutrition focused physical exam indicating a degree of malnutrition. Given ongoing GI symptoms and restricted diet, suspect a degree of malnutrition to still be present. Will assess further on follow up.   Diet Order:   Diet Order             Diet full liquid Room service appropriate? Yes; Fluid consistency: Thin  Diet effective now                   EDUCATION NEEDS:   No education needs have been identified at this time  Skin:  Skin Assessment: Skin Integrity Issues: Skin Integrity Issues:: Stage II Stage II: bilateral buttocks  Last BM:  5/10 (type 6; type 7)  Height:   Ht Readings from Last 1 Encounters:  03/06/23 5\' 4"  (1.626 m)    Weight:   Wt Readings from Last 1 Encounters:  03/06/23 56 kg   BMI:  Body mass index is 21.19 kg/m.  Estimated Nutritional Needs:   Kcal:  1600-1800  Protein:  80-95g  Fluid:  >/=1.6L  Drusilla Kanner, RDN, LDN Clinical Nutrition

## 2023-03-08 NOTE — Anesthesia Postprocedure Evaluation (Signed)
Anesthesia Post Note  Patient: Heather [name]  Procedure(s) Performed: ENTEROSCOPY  Patient location during evaluation: Phase II Anesthesia Type: General Level of consciousness: awake Pain management: pain level controlled Vital Signs Assessment: post-procedure vital signs reviewed and stable Respiratory status: spontaneous breathing and respiratory function stable Cardiovascular status: blood pressure returned to baseline and stable Postop Assessment: no headache and no apparent nausea or vomiting Anesthetic complications: no Comments: Late entry   No notable events documented.   Last Vitals:  Vitals:   03/08/23 0457 03/08/23 1207  BP: (!) 143/59 (!) 133/94  Pulse: (!) 117 (!) 104  Resp: 16 14  Temp: 36.6 C 36.7 C  SpO2: 100% 100%    Last Pain:  Vitals:   03/08/23 0903  TempSrc:   PainSc: 0-No pain                 Windell Norfolk

## 2023-03-08 NOTE — Telephone Encounter (Signed)
Jacques Earthly - Please arrange for a CBC mid to late next week for the patient. (5/15-5/17)  She should have follow up in 2-3 weeks with primary GI (Dr. [name] Payor) at [facility].   Brooke Bonito, MSN, APRN, FNP-BC, AGACNP-BC [facility]

## 2023-03-08 NOTE — Progress Notes (Signed)
PROGRESS NOTE     Heather [name], is a 72 y.o. female, DOB - Jun 16, 1951, OZH:[id number]  Admit date - 02/26/2023   Admitting Physician [name] [name], MD  Outpatient Primary MD for the patient is [name], MD  LOS - 8  Chief Complaint  Patient presents with   Rectal Bleeding        Brief Narrative:  72 y.o. female with medical history significant for GI bleed, CKD 4, CVA, D CHF,  DM, HTN with history of peptic ulcer disease with recent bleeding from duodenal ulcer,, small bowel Crohn's disease admitted with small-volume bright red blood per rectum on 02/26/2023    -Assessment and Plan: 1)Acute GI bleed -Patient's hemoglobin was 3.2 on 02/17/2023--patient required transfusion of PRBC and endoscopy x 2 during that hospitalization -Discharge Hgb on 02/25/2023 was 9.6 -Hemoglobin currently around 9  S/p recent  EGD and flex sig 4/23 and repeat EGD 4/26 due to persistent bleeding Initial EGD from 02/19/2023 showed esophagitis with ulcers as well extensive duodenal ulceration on initial EGD.  Visible vessel treated with epinephrine and hemostasis clip.  She developed recurrent bleeding  - underwent a repeat EGD on 02/22/23 which revealed a visible vessel at a different site in the ulcer crater requiring epinephrine and clip placement.   -H. pylori negative - Limited FS on 02/22/2023 was with  poor prep nondiagnostic. -GI consult appreciated -Patient has received 2 units of PRBC this admission so far -Colonoscopy on 03/01/2023 shows no presence of inflammation hematin or active bleeding was present thorughout the colon.  -c/n IV Protonix and Carafate -Hgb was 11 after transfusion of 2 units of packed blood cells on 02/28/2023 - -03/05/23 -Unable to do CT angio GI bleed protocol due to renal function -Enteroscopy evaluation on 03/06/2023 demonstrating friable mucosa's in the small intestine and son erosions area given concerns for mesenteric ischemia. -Continue paternal side 9 mg daily and  PPI twice a day. -Mesenteric Dopplers has rule out the presence of ischemia. -Will advance diet to soft and if tolerated discharge home with outpatient follow-up with primary gastroenterology service.  2)Chronic diastolic CHF (congestive heart failure) (HCC) Stable and compensated.  Last echo 11/2022, EF of 60 to 65%, grade 1 DD.  Not on diuretic. -Continue to follow daily weights and strict I's and O's -Currently stable and compensated.  3)Diabetes mellitus (HCC) -A1c 6.4 reflecting good diabetic control PTA -Continue to hold Lantus until oral intake is more reliable -Continue to follow CBGs and use sliding scale insulin.  I will avoid coverage unless consistently above 200.  4)HTN -Stable overall. -Continue current antihypertensive agents.  5)Crohn's disease of small intestine without complication (HCC) Stable.  Previously treated with Humira currently not on medications for this. -Recent biopsy during endoscopy was nondiagnostic -Continue to have follow-up with gastroenterology service. -Following enteroscopy findings with friable mucosa and inflammatory changes Entocort 9 mg daily has been started -Continue PPI twice a day. -Continue outpatient follow-up with gastroenterology service.  6) CKD stage -IV -Renal function currently close to baseline; continue to follow renal function trend. -Creatinine is down to 1.73 from 2.43 on 02/25/2023 -Renally adjust medications, avoid nephrotoxic agents / dehydration  / hypotension -Continue to follow renal function trend.  7)Mild Hyponatremia, not POA----suspect dehydration  -Continue to follow electrolytes intermittently -Continue to obtain adequate hydration.  8) anxiety/restlessness -Most likely associated with the initiation of his steroids -Low-dose Xanax will be provided.  Status is: Inpatient   Disposition: The patient is from: Home  Anticipated d/c is to: Home              Anticipated d/c date is: 1 day               Patient currently is not medically stable to d/c. Barriers: Not Clinically Stable-   Code Status :  -  Code Status: Full Code   Family Communication:     (patient is alert, awake and coherent)  -Patient's husband updated  DVT Prophylaxis  :   - SCDs  SCDs Start: 02/26/23 2036   Lab Results  Component Value Date   PLT 189 03/07/2023   Inpatient Medications  Scheduled Meds:  (feeding supplement) PROSource Plus  30 mL Oral BID BM   allopurinol  100 mg Oral Daily   budesonide  9 mg Oral Daily   Chlorhexidine Gluconate Cloth  6 each Topical Daily   feeding supplement  237 mL Oral BID BM   insulin aspart  0-15 Units Subcutaneous Q4H   levETIRAcetam  500 mg Oral BID   metoprolol tartrate  12.5 mg Oral BID   multivitamin with minerals  1 tablet Oral Daily   pantoprazole (PROTONIX) IV  40 mg Intravenous Q12H   polyethylene glycol  17 g Oral Daily   sodium bicarbonate  650 mg Oral TID   sodium chloride flush  10-40 mL Intracatheter Q12H   sucralfate  1 g Oral TID WC & HS   Continuous Infusions:  PRN Meds:.acetaminophen **OR** acetaminophen, ALPRAZolam, melatonin, polyethylene glycol, prochlorperazine, sodium chloride flush   Anti-infectives (From admission, onward)    Start     Dose/Rate Route Frequency Ordered Stop   02/26/23 2200  cefdinir (OMNICEF) capsule 300 mg  Status:  Discontinued        300 mg Oral 2 times daily 02/26/23 2038 02/26/23 2041      Subjective: Heather [name] well tolerating full liquid diet and wanting to have diet advanced; patient reports no chest pain, no nausea, no vomiting no overt bleeding.  Feeling anxious/restless but overall in good spirit and demonstrating improvement.  Objective: Vitals:   03/07/23 2122 03/07/23 2134 03/08/23 0457 03/08/23 1207  BP: (!) 143/64  (!) 143/59 (!) 133/94  Pulse: (!) 114 (!) 101 (!) 117 (!) 104  Resp: 16  16 14   Temp: 98 F (36.7 C)  97.9 F (36.6 C) 98 F (36.7 C)  TempSrc: Oral  Oral   SpO2: 100%   100% 100%  Weight:      Height:        Intake/Output Summary (Last 24 hours) at 03/08/2023 1710 Last data filed at 03/08/2023 1345 Gross per 24 hour  Intake 1060 ml  Output --  Net 1060 ml    Filed Weights   02/26/23 1434 02/28/23 1307 03/06/23 1340  Weight: 55.9 kg 56 kg 56 kg   Physical Exam General exam: Afebrile, no chest pain, no nausea, no vomiting.  Reporting to be hungry.  No upper bleeding appreciated. Respiratory system: Clear to auscultation. Respiratory effort normal.  Good saturation on room air. Cardiovascular system: No rubs or gallops; no JVD. Gastrointestinal system: Abdomen is nondistended, soft and nontender. No organomegaly or masses felt. Normal bowel sounds heard. Central nervous system: No focal neurologic deficit. Extremities: No cyanosis or clubbing. Skin: No petechiae. Psychiatry: Patient reports feeling anxious and is slightly restless.  Data Reviewed: I have personally reviewed following labs and imaging studies  CBC: Recent Labs  Lab 03/03/23 0523 03/04/23 0510 03/05/23 0436 03/06/23 0427 03/07/23  0952  WBC 12.1* 22.9* 18.7* 10.1 8.8  HGB 9.2* 10.6* 9.6* 9.3* 9.7*  HCT 28.4* 32.5* 28.9* 28.1* 29.2*  MCV 94.0 94.2 93.2 93.7 93.6  PLT 134* 177 170 135* 189   Basic Metabolic Panel: Recent Labs  Lab 03/02/23 0459 03/04/23 0510 03/05/23 0436 03/06/23 0427 03/07/23 0952  NA 135 131* 131* 132* 132*  K 4.8 4.6 4.2 4.3 5.0  CL 111 108 108 108 107  CO2 18* 17* 18* 18* 18*  GLUCOSE 112* 64* 50* 62* 353*  BUN 57* 55* 52* 48* 46*  CREATININE 2.02* 1.92* 1.73* 1.50* 1.55*  CALCIUM 8.1* 8.1* 8.3* 8.2* 8.3*  PHOS  --   --   --  2.3*  --    GFR: Estimated Creatinine Clearance: 29.2 mL/min (A) (by C-G formula based on SCr of 1.55 mg/dL (H)).  Liver Function Tests: Recent Labs  Lab 03/04/23 0510 03/06/23 0427  AST 25  --   ALT 14  --   ALKPHOS 82  --   BILITOT 0.5  --   PROT 4.6*  --   ALBUMIN 1.9* 1.7*   Radiology Studies: [place]  MESENTERIC ARTERIES  Result Date: 03/07/2023 CLINICAL DATA:  Anemia Rectal bleeding Hypertension Stroke Diabetes Hyperlipidemia EXAM: [place] MESENTERIC ARTERIAL DOPPLER COMPARISON:  CT abdomen pelvis without contrast 02/17/2023 FINDINGS: Celiac axis: 99 cm/sec Celiac axis with inspiration: 102 cm/sec Celiac axis with expiration: 97 cm/sec Splenic artery: 93 cm/sec Hepatic artery: 110 cm/sec SMA: 242 cm/sec IMA: 112 cm/sec Aorta: 145 cm/sec Aortic size: 1.8 cm proximally, 1.4 cm in the mid segment and 1.2 cm distally IMPRESSION: 1. No hemodynamically significant stenosis of the celiac, superior mesenteric, or inferior mesenteric arteries. 2. Atherosclerotic calcifications seen throughout the abdominal aorta. Electronically Signed   By: Acquanetta Belling M.D.   On: 03/07/2023 15:25    Scheduled Meds:  (feeding supplement) PROSource Plus  30 mL Oral BID BM   allopurinol  100 mg Oral Daily   budesonide  9 mg Oral Daily   Chlorhexidine Gluconate Cloth  6 each Topical Daily   feeding supplement  237 mL Oral BID BM   insulin aspart  0-15 Units Subcutaneous Q4H   levETIRAcetam  500 mg Oral BID   metoprolol tartrate  12.5 mg Oral BID   multivitamin with minerals  1 tablet Oral Daily   pantoprazole (PROTONIX) IV  40 mg Intravenous Q12H   polyethylene glycol  17 g Oral Daily   sodium bicarbonate  650 mg Oral TID   sodium chloride flush  10-40 mL Intracatheter Q12H   sucralfate  1 g Oral TID WC & HS   Continuous Infusions:   LOS: 8 days   Vassie Loll M.D on 03/08/2023 at 11:45 am  Go to [email or web address] - for contact info  [facility] - Office  [phone]  If 7PM-7AM, please contact night-coverage [email or web address] 03/08/2023, 5:10 PM

## 2023-03-08 NOTE — Care Management Important Message (Signed)
Important Message  Patient Details  Name: Heather [name] MRN: [id number] Date of Birth: [birth year]   Medicare Important Message Given:  Yes     Corey Harold 03/08/2023, 10:18 AM

## 2023-03-09 DIAGNOSIS — K922 Gastrointestinal hemorrhage, unspecified: Secondary | ICD-10-CM | POA: Diagnosis not present

## 2023-03-09 DIAGNOSIS — I5032 Chronic diastolic (congestive) heart failure: Secondary | ICD-10-CM | POA: Diagnosis not present

## 2023-03-09 DIAGNOSIS — D62 Acute posthemorrhagic anemia: Secondary | ICD-10-CM | POA: Diagnosis not present

## 2023-03-09 DIAGNOSIS — E1322 Other specified diabetes mellitus with diabetic chronic kidney disease: Secondary | ICD-10-CM | POA: Diagnosis not present

## 2023-03-09 LAB — BASIC METABOLIC PANEL
Anion gap: 5 (ref 5–15)
BUN: 50 mg/dL — ABNORMAL HIGH (ref 8–23)
CO2: 20 mmol/L — ABNORMAL LOW (ref 22–32)
Calcium: 8.3 mg/dL — ABNORMAL LOW (ref 8.9–10.3)
Chloride: 106 mmol/L (ref 98–111)
Creatinine, Ser: 1.29 mg/dL — ABNORMAL HIGH (ref 0.44–1.00)
GFR, Estimated: 45 mL/min — ABNORMAL LOW (ref >60)
Glucose, Bld: 60 mg/dL — ABNORMAL LOW (ref 70–99)
Potassium: 4.7 mmol/L (ref 3.5–5.1)
Sodium: 131 mmol/L — ABNORMAL LOW (ref 135–145)

## 2023-03-09 LAB — CBC
HCT: 25.3 % — ABNORMAL LOW (ref 36.0–46.0)
Hemoglobin: 8.4 g/dL — ABNORMAL LOW (ref 12.0–15.0)
MCH: 31.1 pg (ref 26.0–34.0)
MCHC: 33.2 g/dL (ref 30.0–36.0)
MCV: 93.7 fL (ref 80.0–100.0)
Platelets: 160 10*3/uL (ref 150–400)
RBC: 2.7 MIL/uL — ABNORMAL LOW (ref 3.87–5.11)
RDW: 15.5 % (ref 11.5–15.5)
WBC: 6.9 10*3/uL (ref 4.0–10.5)
nRBC: 0 % (ref 0.0–0.2)

## 2023-03-09 LAB — GLUCOSE, CAPILLARY
Glucose-Capillary: 93 mg/dL (ref 70–99)
Glucose-Capillary: 53 mg/dL — ABNORMAL LOW (ref 70–99)
Glucose-Capillary: 71 mg/dL (ref 70–99)
Glucose-Capillary: 142 mg/dL — ABNORMAL HIGH (ref 70–99)

## 2023-03-09 MED ORDER — LACTINEX PO CHEW: 1.0000 | CHEWABLE_TABLET | Freq: Three times a day (TID) | ORAL | 0 refills | Status: AC

## 2023-03-09 MED ORDER — INSULIN GLARGINE-YFGN 100 UNIT/ML ~~LOC~~ SOPN: 5.0000 [IU] | PEN_INJECTOR | Freq: Every day | SUBCUTANEOUS | Status: DC

## 2023-03-09 MED ORDER — SODIUM BICARBONATE 650 MG PO TABS: 650.0000 mg | ORAL_TABLET | Freq: Two times a day (BID) | ORAL | 1 refills | Status: DC

## 2023-03-09 MED ORDER — BUDESONIDE 3 MG PO CPEP: 9.0000 mg | ORAL_CAPSULE | Freq: Every day | ORAL | 0 refills | Status: DC

## 2023-03-09 NOTE — Progress Notes (Signed)
Pt discharge instructions given pt verbalized understanding. Husband informed verbalized understanding on the way to pick pt up. PICC removed patient tolerated well.

## 2023-03-09 NOTE — TOC Transition Note (Signed)
Transition of Care [facility]) - CM/SW Discharge Note   Patient Details  Name: Heather [name] MRN: [id number] Date of Birth: 15-Nov-1950  [facility] CM/SW Contact:  Elliot Gault, LCSW Phone Number: 03/09/2023, 1:25 PM   Clinical Narrative:     Pt stable for dc with HH per MD. Dorette Grate at [facility] and they will follow up with pt at home.  No other TOC needs for dc.  Final next level of care: Home w Home Health Services Barriers to Discharge: Barriers Resolved   Patient Goals and CMS Choice CMS Medicare.gov Compare Post Acute Care list provided to:: Patient Represenative (must comment) Choice offered to / list presented to : Spouse  Discharge Placement                         Discharge Plan and Services Additional resources added to the After Visit Summary for   In-house Referral: Clinical Social Work Discharge Planning Services: CM Consult Post Acute Care Choice: Resumption of Svcs/PTA Provider                               Social Determinants of Health (SDOH) Interventions SDOH Screenings   Food Insecurity: No Food Insecurity (02/17/2023)  Housing: Low Risk  (02/17/2023)  Transportation Needs: No Transportation Needs (02/17/2023)  Utilities: Not At Risk (02/17/2023)  Tobacco Use: Low Risk  (03/06/2023)     Readmission Risk Interventions    03/01/2023    1:24 PM 02/06/2023    8:28 AM 11/14/2022   11:31 AM  Readmission Risk Prevention Plan  Transportation Screening Complete Complete   Medication Review [employer]) Complete Complete   PCP or Specialist appointment within 3-5 days of discharge   Complete  HRI or Home Care Consult Complete Complete   SW Recovery Care/Counseling Consult Complete Complete   Palliative Care Screening Not Applicable Not Applicable   Skilled Nursing Facility Not Applicable Not Applicable

## 2023-03-09 NOTE — Discharge Summary (Signed)
Physician Discharge Summary   Patient: Heather [name] MRN: [id number] DOB: 20-Oct-1951  Admit date:     02/26/2023  Discharge date: 03/09/23  Discharge Physician: Vassie Loll   PCP: [name], MD   Recommendations at discharge:  Repeat CBC to follow hemoglobin trend/stability Repeat basic metabolic panel to follow electrolytes and renal function Make sure patient follow-up with gastroenterology service as instructed Reassess blood pressure and further adjust antihypertensive treatment as needed. Continue close monitoring of patient's CBGs/A1c with further adjustment to hypoglycemia regimen as required.  Discharge Diagnoses: Principal Problem:   GI bleed Active Problems:   ABLA (acute blood loss anemia)   Chronic diastolic CHF (congestive heart failure) (HCC)   Diabetes mellitus (HCC)   UTI (urinary tract infection)   Chronic kidney disease (CKD), stage IV (severe) (HCC)   Hematochezia   Crohn's disease of small intestine without complication (HCC)   Multiple duodenal ulcers   HTN (hypertension)   Acute GI bleeding   History of Crohn's disease   Anemia  Brief Hospital admission narrative: 72 y.o. female with medical history significant for GI bleed, CKD 4, CVA, D CHF,  DM, HTN with history of peptic ulcer disease with recent bleeding from duodenal ulcer,, small bowel Crohn's disease admitted with small-volume bright red blood per rectum on 02/26/2023   Assessment and Plan: 1)Acute GI bleed -Patient's hemoglobin was 3.2 on 02/17/2023--patient required transfusion of PRBC and endoscopy x 2 during that hospitalization -Discharge Hgb on 02/25/2023 was 9.6 -Hemoglobin currently around 9  S/p recent  EGD and flex sig 4/23 and repeat EGD 4/26 due to persistent bleeding Initial EGD from 02/19/2023 showed esophagitis with ulcers as well extensive duodenal ulceration on initial EGD.  Visible vessel treated with epinephrine and hemostasis clip.  She developed recurrent bleeding  -  underwent a repeat EGD on 02/22/23 which revealed a visible vessel at a different site in the ulcer crater requiring epinephrine and clip placement.   -H. pylori negative - Limited FS on 02/22/2023 was with  poor prep nondiagnostic. -GI consult appreciated -Patient has received 2 units of PRBC this admission so far -Colonoscopy on 03/01/2023 shows no presence of inflammation hematin or active bleeding was present thorughout the colon.  -c/n IV Protonix and Carafate -Hemoglobin level has remained stable and no overt bleeding has been appreciated in the last 72 hours prior to discharge. -Unable to do CT angio GI bleed protocol due to renal function -Enteroscopy evaluation on 03/06/2023 demonstrating friable mucosa's in the small intestine and son erosions area given concerns for mesenteric ischemia. -Continue paternal side 9 mg daily and PPI twice a day. -Mesenteric Dopplers has rule out the presence of ischemia. -Diet advanced and well-tolerated prior to discharge. -Continue outpatient follow-up with gastroenterology service.   2)Chronic diastolic CHF (congestive heart failure) (HCC) Stable and compensated.  Last echo 11/2022, EF of 60 to 65%, grade 1 DD.  Not on diuretic. -Continue to follow daily weights and low-sodium diet. -Currently stable and compensated. -Continue outpatient follow-up with cardiology service.   3)Diabetes mellitus with hyperglycemia (HCC) -A1c 6.4 reflecting good diabetic control prior to admission. -Resewn home adjusted hypoglycemic regimen and follow CBGs fluctuation. -While no eating much patient experience mild transient episodes of hypoglycemia while inpatient.   4)HTN -Stable overall. -Continue current antihypertensive agents. -Healthy diet discussed with patient.   5)Crohn's disease of small intestine without complication (HCC) Stable.  Previously treated with Humira currently not on medications for this. -Recent biopsy during endoscopy was  nondiagnostic -Continue to  have follow-up with gastroenterology service. -Following enteroscopy findings with friable mucosa and inflammatory changes Entocort 9 mg daily has been started -Continue PPI twice a day. -Continue outpatient follow-up with gastroenterology service.   6) CKD stage -IV -Renal function currently close to baseline; continue to follow renal function trend. -Creatinine is down to 1.73 from 2.43 on 02/25/2023 -Renally adjust medications, avoid nephrotoxic agents / dehydration  / hypotension -Continue to follow renal function trend.   7)Mild Hyponatremia, not POA----suspect dehydration  -Improved/resolved with fluid resuscitation -Continue to follow electrolytes intermittently -Continue to maintain adequate hydration.   8) anxiety/restlessness -Most likely associated with the initiation of his steroids -Significantly improved/resolved with low-dose Xanax provided while inpatient.  Consultants: Gastroenterology service Procedures performed: See below for x-ray report; capsule endoscopy, EGD/colonoscopy and push enteroscopy. Disposition: Home Diet recommendation: Heart healthy/modified carbohydrate diet.  DISCHARGE MEDICATION: Allergies as of 03/09/2023       Reactions   Cozaar [losartan] Nausea And Vomiting, Swelling, Rash   Quinolones Itching, Nausea And Vomiting, Nausea Only, Swelling   Swelling of face, jaw, and lips   Cymbalta [duloxetine Hcl] Itching, Swelling   Sulfa Antibiotics Nausea And Vomiting, Swelling   Headache  Swelling of feet, legs   Synalar [fluocinolone] Swelling   Cipro [ciprofloxacin Hcl] Nausea And Vomiting, Swelling   Swelling of face, jaw, lips   Diovan [valsartan] Nausea And Vomiting, Swelling   Glucophage [metformin] Other (See Comments)   GI upset   Ketalar [ketamine] Swelling   Norco [hydrocodone-acetaminophen] Itching, Swelling   Norvasc [amlodipine Besylate] Itching, Swelling   Fluid retention    Nsaids Nausea And Vomiting,  Other (See Comments)   Has bleeding ulcers   Tape Itching, Rash   Paper tape can only be used on this patient   Zestril [lisinopril] Swelling, Rash   Oral swelling Red streaks on arms/legs        Medication List     STOP taking these medications    cefdinir 300 MG capsule Commonly known as: OMNICEF   HYDROcodone-acetaminophen 5-325 MG tablet Commonly known as: NORCO/VICODIN       TAKE these medications    (feeding supplement) PROSource Plus liquid Take 30 mLs by mouth 3 (three) times daily between meals.   allopurinol 100 MG tablet Commonly known as: ZYLOPRIM Take 100 mg by mouth daily.   atorvastatin 80 MG tablet Commonly known as: LIPITOR Take 1 tablet (80 mg total) by mouth daily. Restart on 06/29/22 What changed: when to take this   budesonide 3 MG 24 hr capsule Commonly known as: ENTOCORT EC Take 3 capsules (9 mg total) by mouth daily. Start taking on: Mar 10, 2023   esomeprazole 40 MG capsule Commonly known as: NexIUM Take 1 capsule (40 mg total) by mouth 2 (two) times daily before a meal.   insulin aspart 100 UNIT/ML FlexPen Commonly known as: NOVOLOG Inject 2 Units into the skin 3 (three) times daily with meals. Give only if eats 50% or more of meal. What changed: how much to take   insulin glargine-yfgn 100 UNIT/ML Pen Commonly known as: SEMGLEE Inject 5 Units into the skin daily. What changed: how much to take   lactobacillus acidophilus & bulgar chewable tablet Chew 1 tablet by mouth 3 (three) times daily with meals for 10 days.   levETIRAcetam 500 MG tablet Commonly known as: KEPPRA Take 500 mg by mouth 2 (two) times daily.   Maalox Max 400-400-40 MG/5ML suspension Generic drug: alum & mag hydroxide-simeth Take 10 mLs by mouth  every 8 (eight) hours for 12 days.   metoprolol succinate 25 MG 24 hr tablet Commonly known as: TOPROL-XL Take 1.5 tablets (37.5 mg total) by mouth daily.   ondansetron 4 MG disintegrating tablet Commonly known  as: ZOFRAN-ODT Take 4 mg by mouth every 6 (six) hours as needed.   sodium bicarbonate 650 MG tablet Take 1 tablet (650 mg total) by mouth 2 (two) times daily. What changed: when to take this   venlafaxine XR 150 MG 24 hr capsule Commonly known as: EFFEXOR-XR Take 150 mg by mouth daily.               Discharge Care Instructions  (From admission, onward)           Start     Ordered   03/09/23 0000  Discharge wound care:       Comments: Keep pressure injuries areas clean, dry and follow consult repositioning.   03/09/23 1248            Follow-up Information     [name], MD. Schedule an appointment as soon as possible for a visit in 1 week(s).   Specialty: Internal Medicine Contact information: [address]  [place] [address] [phone]         Iva Boop, MD. Schedule an appointment as soon as possible for a visit in 2 week(s).   Specialty: Gastroenterology Contact information: [address] [address] [phone]                Discharge Exam: Filed Weights   02/26/23 1434 02/28/23 1307 03/06/23 1340  Weight: 55.9 kg 56 kg 56 kg   General exam: Alert, awake, oriented x 3; in no acute distress, tolerating diet and reporting feeling ready to go home.  No nausea, no vomiting, no chest pain, no shortness of breath and no abdominal discomfort reported. Respiratory system: Clear to auscultation. Respiratory effort normal.  Good saturation on room air. Cardiovascular system: Rate controlled, no rubs, no gallops, no JVD on exam. Gastrointestinal system: Abdomen is nondistended, soft and nontender.  Positive bowel sounds appreciated. Central nervous system: No focal neurological deficits. Extremities: No cyanosis or clubbing.  No edema. Skin: No petechiae. Psychiatry: Mood & affect appropriate.    Condition at discharge: Stable and improved.  The results of significant diagnostics from this hospitalization (including  imaging, microbiology, ancillary and laboratory) are listed below for reference.   Imaging Studies: [place] MESENTERIC ARTERIES  Result Date: 03/07/2023 CLINICAL DATA:  Anemia Rectal bleeding Hypertension Stroke Diabetes Hyperlipidemia EXAM: [place] MESENTERIC ARTERIAL DOPPLER COMPARISON:  CT abdomen pelvis without contrast 02/17/2023 FINDINGS: Celiac axis: 99 cm/sec Celiac axis with inspiration: 102 cm/sec Celiac axis with expiration: 97 cm/sec Splenic artery: 93 cm/sec Hepatic artery: 110 cm/sec SMA: 242 cm/sec IMA: 112 cm/sec Aorta: 145 cm/sec Aortic size: 1.8 cm proximally, 1.4 cm in the mid segment and 1.2 cm distally IMPRESSION: 1. No hemodynamically significant stenosis of the celiac, superior mesenteric, or inferior mesenteric arteries. 2. Atherosclerotic calcifications seen throughout the abdominal aorta. Electronically Signed   By: Acquanetta Belling M.D.   On: 03/07/2023 15:25   DG Abd 1 View  Result Date: 03/06/2023 CLINICAL DATA:  Retained foreign body. EXAM: ABDOMEN - 1 VIEW COMPARISON:  03/05/2023. FINDINGS: The bowel gas pattern is normal. A moderate amount of retained stool is present in the colon. Cholecystectomy clips are noted in the right upper quadrant. A 1.1 cm radiopaque density projects over the right lower quadrant which was seen  on the prior exam. Extensive vascular calcifications are noted in the left upper quadrant, pelvis, and bilateral lower extremities. Fixation hardware is noted in the proximal right femur. Lumbar spinal fusion hardware is noted. IMPRESSION: 1. Radiopaque density in the right lower quadrant measuring 1.1 cm, unchanged from the prior exam, possible endoscopy capsule. 2. Nonobstructive bowel-gas pattern. 3. Moderate amount of retained stool in the colon. Electronically Signed   By: Thornell Sartorius M.D.   On: 03/06/2023 04:55   DG Abd 1 View  Result Date: 03/05/2023 CLINICAL DATA:  Retained foreign body. Retained capsule from yesterday. EXAM: ABDOMEN - 1 VIEW COMPARISON:  CT  abdomen/pelvis 02/17/2023. FINDINGS: No dilated loops of small bowel are demonstrated to suggest small bowel obstruction. Moderate colonic stool burden. A 1.7 cm dense object projects in the region of the right lower quadrant of the abdomen/right hemipelvis (see annotations on images). Surgical clips within the right upper quadrant of the abdomen. Lumbar posterior spinal fusion construct. Prior ORIF of the right femur, incompletely imaged.No acute osseous abnormality identified. IMPRESSION: 1. 1.7 cm dense object projecting in the region of the right lower quadrant of the abdomen/right hemipelvis, likely reflecting the reported endoscopy capsule. 2. Nonobstructive bowel gas pattern. 3. Moderate colonic stool burden. Electronically Signed   By: Jackey Loge D.O.   On: 03/05/2023 14:05   [place] EKG SITE RITE  Result Date: 02/27/2023 If Site Rite image not attached, placement could not be confirmed due to current cardiac rhythm.  CT ABDOMEN PELVIS WO CONTRAST  Result Date: 02/17/2023 CLINICAL DATA:  Concern for bowel obstruction. Patient reportedly has red stool. EXAM: CT ABDOMEN AND PELVIS WITHOUT CONTRAST TECHNIQUE: Multidetector CT imaging of the abdomen and pelvis was performed following the standard protocol without IV contrast. RADIATION DOSE REDUCTION: This exam was performed according to the departmental dose-optimization program which includes automated exposure control, adjustment of the mA and/or kV according to patient size and/or use of iterative reconstruction technique. COMPARISON:  CT abdomen pelvis dated 02/04/2023. FINDINGS: Lower chest: Moderate bilateral pleural effusions with associated atelectasis are partially imaged. The heart is mildly enlarged. The blood pool is hypoattenuating, suggestive of anemia. Hepatobiliary: No focal liver abnormality is seen. Status post cholecystectomy. No biliary dilatation. Pancreas: Atrophic. A 1.8 cm low-density lesion in the pancreatic tail appears unchanged.  No pancreatic ductal dilatation or surrounding inflammatory changes. Spleen: Normal in size without focal abnormality. Adrenals/Urinary Tract: Adrenal glands are unremarkable. Bilateral punctate calcifications in both kidneys may represent nonobstructing calculi measuring up to 2 mm and/or vascular calcifications. No suspicious focal lesion or hydronephrosis on either side. Bladder is unremarkable. Stomach/Bowel: Stomach is within normal limits. The appendix is not definitely identified, however no pericecal inflammatory changes are noted to suggest acute appendicitis. There is a mild bowel wall thickening and mucosal enhancement involving portions of the sigmoid colon and rectum. No significant surrounding inflammatory changes. No bowel obstruction. Vascular/Lymphatic: Aortic atherosclerosis. No enlarged abdominal or pelvic lymph nodes. Reproductive: Status post hysterectomy. No adnexal masses. Other: No abdominal wall hernia or abnormality. No abdominopelvic ascites. Anasarca is noted Musculoskeletal: There has been slight progression since 02/04/2023 of a T12 compression deformity with approximately 50% height loss centrally and 3 mm retropulsion into the central canal. Fixation hardware is seen in the right femur. Fixation hardware in the lumbar spine from L1-L4. IMPRESSION: 1. Mild bowel wall thickening and mucosal enhancement involving portions of the sigmoid colon and rectum may reflect proctocolitis. No bowel obstruction. 2. Moderate bilateral pleural effusions with associated atelectasis. 3.  Slight progression since 02/04/2023 of a T12 compression deformity with approximately 50% height loss centrally and 3 mm retropulsion into the central canal. 4. Low-density pancreatic lesion. When the patient is clinically stable and able to follow directions and hold their breath (preferably as an outpatient) further evaluation with dedicated abdominal MRI could be considered for further characterization. Aortic  Atherosclerosis (ICD10-I70.0). Electronically Signed   By: Romona Curls M.D.   On: 02/17/2023 17:36    Microbiology: Results for orders placed or performed during the hospital encounter of 02/17/23  MRSA Next Gen by PCR, Nasal     Status: Abnormal   Collection Time: 02/17/23  7:35 PM   Specimen: Nasal Mucosa; Nasal Swab  Result Value Ref Range Status   MRSA by PCR Next Gen DETECTED (A) NOT DETECTED Final    Comment: RESULT CALLED TO, READ BACK BY AND VERIFIED WITH: KING J @ 0747 ON [id number] BY HENDERSON L (NOTE) The GeneXpert MRSA Assay (FDA approved for NASAL specimens only), is one component of a comprehensive MRSA colonization surveillance program. It is not intended to diagnose MRSA infection nor to guide or monitor treatment for MRSA infections. Test performance is not FDA approved in patients less than 17 years old. Performed at [facility], [address] [place], [place] [address]   Urine Culture (for pregnant, neutropenic or urologic patients or patients with an indwelling urinary catheter)     Status: Abnormal   Collection Time: 02/20/23  3:20 PM   Specimen: Urine, Clean Catch  Result Value Ref Range Status   Specimen Description   Final    URINE, CLEAN CATCH Performed at [facility], [address] [place], [place] [address]    Special Requests   Final    NONE Performed at [facility], [address] [place], [place] [address]    Culture (A)  Final    <10,000 COLONIES/mL INSIGNIFICANT GROWTH Performed at [facility], [address], [place], [place] [address]    Report Status 02/21/2023 FINAL  Final    Labs: CBC: Recent Labs  Lab 03/04/23 0510 03/05/23 0436 03/06/23 0427 03/07/23 0952 03/09/23 0500  WBC 22.9* 18.7* 10.1 8.8 6.9  HGB 10.6* 9.6* 9.3* 9.7* 8.4*  HCT 32.5* 28.9* 28.1* 29.2* 25.3*  MCV 94.2 93.2 93.7 93.6 93.7  PLT 177 170 135* 189 160   Basic Metabolic Panel: Recent Labs  Lab 03/04/23 0510 03/05/23 0436 03/06/23 0427  03/07/23 0952 03/09/23 0500  NA 131* 131* 132* 132* 131*  K 4.6 4.2 4.3 5.0 4.7  CL 108 108 108 107 106  CO2 17* 18* 18* 18* 20*  GLUCOSE 64* 50* 62* 353* 60*  BUN 55* 52* 48* 46* 50*  CREATININE 1.92* 1.73* 1.50* 1.55* 1.29*  CALCIUM 8.1* 8.3* 8.2* 8.3* 8.3*  PHOS  --   --  2.3*  --   --    Liver Function Tests: Recent Labs  Lab 03/04/23 0510 03/06/23 0427  AST 25  --   ALT 14  --   ALKPHOS 82  --   BILITOT 0.5  --   PROT 4.6*  --   ALBUMIN 1.9* 1.7*   CBG: Recent Labs  Lab 03/08/23 2012 03/08/23 2338 03/09/23 0304 03/09/23 0714 03/09/23 0753  GLUCAP 356* 161* 93 53* 71    Discharge time spent: greater than 30 minutes.  Signed: Vassie Loll, MD [facility] 03/09/2023

## 2023-03-11 ENCOUNTER — Telehealth: Payer: Self-pay | Admitting: *Deleted

## 2023-03-11 ENCOUNTER — Other Ambulatory Visit: Payer: Self-pay | Admitting: *Deleted

## 2023-03-11 DIAGNOSIS — K922 Gastrointestinal hemorrhage, unspecified: Secondary | ICD-10-CM

## 2023-03-11 NOTE — Telephone Encounter (Signed)
LMOM for pt to call office  

## 2023-03-12 ENCOUNTER — Encounter (HOSPITAL_COMMUNITY): Payer: Self-pay | Admitting: Internal Medicine

## 2023-03-13 ENCOUNTER — Observation Stay (HOSPITAL_COMMUNITY): Payer: Medicare PPO

## 2023-03-13 ENCOUNTER — Encounter (HOSPITAL_COMMUNITY): Payer: Self-pay | Admitting: *Deleted

## 2023-03-13 ENCOUNTER — Inpatient Hospital Stay (HOSPITAL_COMMUNITY)
Admission: EM | Admit: 2023-03-13 | Discharge: 2023-03-19 | DRG: 377 | Disposition: A | Discharge status: Hospice | Payer: Medicare PPO | Attending: Family Medicine | Admitting: Family Medicine

## 2023-03-13 ENCOUNTER — Other Ambulatory Visit: Payer: Self-pay

## 2023-03-13 DIAGNOSIS — Z66 Do not resuscitate: Secondary | ICD-10-CM | POA: Diagnosis present

## 2023-03-13 DIAGNOSIS — Z886 Allergy status to analgesic agent status: Secondary | ICD-10-CM

## 2023-03-13 DIAGNOSIS — K921 Melena: Principal | ICD-10-CM | POA: Diagnosis present

## 2023-03-13 DIAGNOSIS — E871 Hypo-osmolality and hyponatremia: Secondary | ICD-10-CM | POA: Diagnosis not present

## 2023-03-13 DIAGNOSIS — Z79899 Other long term (current) drug therapy: Secondary | ICD-10-CM | POA: Diagnosis not present

## 2023-03-13 DIAGNOSIS — K625 Hemorrhage of anus and rectum: Secondary | ICD-10-CM | POA: Diagnosis not present

## 2023-03-13 DIAGNOSIS — I700 Atherosclerosis of aorta: Secondary | ICD-10-CM | POA: Diagnosis not present

## 2023-03-13 DIAGNOSIS — Z881 Allergy status to other antibiotic agents status: Secondary | ICD-10-CM

## 2023-03-13 DIAGNOSIS — E538 Deficiency of other specified B group vitamins: Secondary | ICD-10-CM | POA: Diagnosis present

## 2023-03-13 DIAGNOSIS — K5000 Crohn's disease of small intestine without complications: Secondary | ICD-10-CM | POA: Diagnosis present

## 2023-03-13 DIAGNOSIS — M7989 Other specified soft tissue disorders: Secondary | ICD-10-CM | POA: Insufficient documentation

## 2023-03-13 DIAGNOSIS — E46 Unspecified protein-calorie malnutrition: Secondary | ICD-10-CM

## 2023-03-13 DIAGNOSIS — K2971 Gastritis, unspecified, with bleeding: Secondary | ICD-10-CM | POA: Diagnosis not present

## 2023-03-13 DIAGNOSIS — E43 Unspecified severe protein-calorie malnutrition: Secondary | ICD-10-CM | POA: Diagnosis present

## 2023-03-13 DIAGNOSIS — Z888 Allergy status to other drugs, medicaments and biological substances status: Secondary | ICD-10-CM

## 2023-03-13 DIAGNOSIS — D519 Vitamin B12 deficiency anemia, unspecified: Secondary | ICD-10-CM | POA: Diagnosis present

## 2023-03-13 DIAGNOSIS — Z86011 Personal history of benign neoplasm of the brain: Secondary | ICD-10-CM

## 2023-03-13 DIAGNOSIS — K922 Gastrointestinal hemorrhage, unspecified: Secondary | ICD-10-CM

## 2023-03-13 DIAGNOSIS — E1165 Type 2 diabetes mellitus with hyperglycemia: Secondary | ICD-10-CM | POA: Diagnosis present

## 2023-03-13 DIAGNOSIS — R718 Other abnormality of red blood cells: Secondary | ICD-10-CM | POA: Diagnosis not present

## 2023-03-13 DIAGNOSIS — N184 Chronic kidney disease, stage 4 (severe): Secondary | ICD-10-CM | POA: Diagnosis present

## 2023-03-13 DIAGNOSIS — Z833 Family history of diabetes mellitus: Secondary | ICD-10-CM

## 2023-03-13 DIAGNOSIS — I2510 Atherosclerotic heart disease of native coronary artery without angina pectoris: Secondary | ICD-10-CM | POA: Diagnosis present

## 2023-03-13 DIAGNOSIS — Z794 Long term (current) use of insulin: Secondary | ICD-10-CM

## 2023-03-13 DIAGNOSIS — D649 Anemia, unspecified: Secondary | ICD-10-CM

## 2023-03-13 DIAGNOSIS — Z885 Allergy status to narcotic agent status: Secondary | ICD-10-CM

## 2023-03-13 DIAGNOSIS — I130 Hypertensive heart and chronic kidney disease with heart failure and stage 1 through stage 4 chronic kidney disease, or unspecified chronic kidney disease: Secondary | ICD-10-CM | POA: Diagnosis present

## 2023-03-13 DIAGNOSIS — K254 Chronic or unspecified gastric ulcer with hemorrhage: Secondary | ICD-10-CM | POA: Diagnosis not present

## 2023-03-13 DIAGNOSIS — Z8673 Personal history of transient ischemic attack (TIA), and cerebral infarction without residual deficits: Secondary | ICD-10-CM

## 2023-03-13 DIAGNOSIS — Z5111 Encounter for antineoplastic chemotherapy: Secondary | ICD-10-CM | POA: Diagnosis not present

## 2023-03-13 DIAGNOSIS — Z882 Allergy status to sulfonamides status: Secondary | ICD-10-CM | POA: Diagnosis not present

## 2023-03-13 DIAGNOSIS — F32A Depression, unspecified: Secondary | ICD-10-CM | POA: Diagnosis present

## 2023-03-13 DIAGNOSIS — E1140 Type 2 diabetes mellitus with diabetic neuropathy, unspecified: Secondary | ICD-10-CM | POA: Diagnosis present

## 2023-03-13 DIAGNOSIS — E1122 Type 2 diabetes mellitus with diabetic chronic kidney disease: Secondary | ICD-10-CM | POA: Diagnosis present

## 2023-03-13 DIAGNOSIS — H9220 Otorrhagia, unspecified ear: Secondary | ICD-10-CM | POA: Diagnosis not present

## 2023-03-13 DIAGNOSIS — E8809 Other disorders of plasma-protein metabolism, not elsewhere classified: Secondary | ICD-10-CM | POA: Diagnosis present

## 2023-03-13 DIAGNOSIS — K264 Chronic or unspecified duodenal ulcer with hemorrhage: Secondary | ICD-10-CM | POA: Diagnosis not present

## 2023-03-13 DIAGNOSIS — Z7401 Bed confinement status: Secondary | ICD-10-CM

## 2023-03-13 DIAGNOSIS — I5032 Chronic diastolic (congestive) heart failure: Secondary | ICD-10-CM | POA: Diagnosis present

## 2023-03-13 DIAGNOSIS — K50011 Crohn's disease of small intestine with rectal bleeding: Secondary | ICD-10-CM | POA: Diagnosis not present

## 2023-03-13 DIAGNOSIS — K5090 Crohn's disease, unspecified, without complications: Secondary | ICD-10-CM | POA: Diagnosis present

## 2023-03-13 DIAGNOSIS — Z6820 Body mass index (BMI) 20.0-20.9, adult: Secondary | ICD-10-CM

## 2023-03-13 DIAGNOSIS — E876 Hypokalemia: Secondary | ICD-10-CM | POA: Diagnosis present

## 2023-03-13 DIAGNOSIS — Z800 Family history of malignant neoplasm of digestive organs: Secondary | ICD-10-CM

## 2023-03-13 DIAGNOSIS — I10 Essential (primary) hypertension: Secondary | ICD-10-CM | POA: Diagnosis not present

## 2023-03-13 DIAGNOSIS — R600 Localized edema: Secondary | ICD-10-CM | POA: Diagnosis not present

## 2023-03-13 DIAGNOSIS — K6389 Other specified diseases of intestine: Secondary | ICD-10-CM | POA: Diagnosis not present

## 2023-03-13 DIAGNOSIS — D62 Acute posthemorrhagic anemia: Secondary | ICD-10-CM | POA: Diagnosis present

## 2023-03-13 DIAGNOSIS — E782 Mixed hyperlipidemia: Secondary | ICD-10-CM | POA: Diagnosis present

## 2023-03-13 DIAGNOSIS — Z7189 Other specified counseling: Secondary | ICD-10-CM | POA: Diagnosis not present

## 2023-03-13 DIAGNOSIS — Z515 Encounter for palliative care: Secondary | ICD-10-CM | POA: Diagnosis not present

## 2023-03-13 DIAGNOSIS — Z90710 Acquired absence of both cervix and uterus: Secondary | ICD-10-CM

## 2023-03-13 LAB — COMPREHENSIVE METABOLIC PANEL
ALT: 22 U/L (ref 0–44)
AST: 33 U/L (ref 15–41)
Albumin: 1.9 g/dL — ABNORMAL LOW (ref 3.5–5.0)
Alkaline Phosphatase: 79 U/L (ref 38–126)
Anion gap: 7 (ref 5–15)
BUN: 73 mg/dL — ABNORMAL HIGH (ref 8–23)
CO2: 20 mmol/L — ABNORMAL LOW (ref 22–32)
Calcium: 7.8 mg/dL — ABNORMAL LOW (ref 8.9–10.3)
Chloride: 105 mmol/L (ref 98–111)
Creatinine, Ser: 1.72 mg/dL — ABNORMAL HIGH (ref 0.44–1.00)
GFR, Estimated: 32 mL/min — ABNORMAL LOW (ref >60)
Glucose, Bld: 311 mg/dL — ABNORMAL HIGH (ref 70–99)
Potassium: 4.9 mmol/L (ref 3.5–5.1)
Sodium: 132 mmol/L — ABNORMAL LOW (ref 135–145)
Total Bilirubin: 0.6 mg/dL (ref 0.3–1.2)
Total Protein: 4.5 g/dL — ABNORMAL LOW (ref 6.5–8.1)

## 2023-03-13 LAB — CBC
HCT: 20.1 % — ABNORMAL LOW (ref 36.0–46.0)
Hemoglobin: 6.3 g/dL — CL (ref 12.0–15.0)
MCH: 31.5 pg (ref 26.0–34.0)
MCHC: 31.3 g/dL (ref 30.0–36.0)
MCV: 100.5 fL — ABNORMAL HIGH (ref 80.0–100.0)
Platelets: 272 10*3/uL (ref 150–400)
RBC: 2 MIL/uL — ABNORMAL LOW (ref 3.87–5.11)
RDW: 16.4 % — ABNORMAL HIGH (ref 11.5–15.5)
WBC: 12.3 10*3/uL — ABNORMAL HIGH (ref 4.0–10.5)
nRBC: 0 % (ref 0.0–0.2)

## 2023-03-13 LAB — TYPE AND SCREEN
ABO/RH(D): O POS
Antibody Screen: NEGATIVE
Unit division: 0
Unit division: 0

## 2023-03-13 LAB — POC OCCULT BLOOD, ED: Fecal Occult Bld: POSITIVE — AB

## 2023-03-13 LAB — PREPARE RBC (CROSSMATCH)

## 2023-03-13 LAB — BPAM RBC
Blood Product Expiration Date: 202406202359
Unit Type and Rh: 5100

## 2023-03-13 MED ORDER — PANTOPRAZOLE INFUSION (NEW) - SIMPLE MED
8.0000 mg/h | INTRAVENOUS | Status: DC
  Administered 2023-03-14 – 2023-03-15 (×3): 8 mg/h via INTRAVENOUS
  Filled 2023-03-13 (×2): qty 100
  Filled 2023-03-13 (×2): qty 80
  Filled 2023-03-13 (×2): qty 100
  Filled 2023-03-13: qty 80

## 2023-03-13 MED ORDER — CALCIUM GLUCONATE-NACL 1-0.675 GM/50ML-% IV SOLN
1.0000 g | Freq: Once | INTRAVENOUS | Status: AC
  Administered 2023-03-13: 1000 mg via INTRAVENOUS
  Filled 2023-03-13: qty 50

## 2023-03-13 MED ORDER — ATORVASTATIN CALCIUM 40 MG PO TABS
80.0000 mg | ORAL_TABLET | Freq: Every day | ORAL | Status: DC
  Administered 2023-03-14 – 2023-03-18 (×5): 80 mg via ORAL
  Filled 2023-03-13 (×6): qty 2

## 2023-03-13 MED ORDER — METOPROLOL SUCCINATE ER 25 MG PO TB24
37.5000 mg | ORAL_TABLET | Freq: Every day | ORAL | Status: DC
  Administered 2023-03-14 – 2023-03-18 (×4): 37.5 mg via ORAL
  Filled 2023-03-13 (×6): qty 2

## 2023-03-13 MED ORDER — ONDANSETRON HCL 4 MG/2ML IJ SOLN
4.0000 mg | Freq: Four times a day (QID) | INTRAMUSCULAR | Status: DC | PRN
  Administered 2023-03-16: 4 mg via INTRAVENOUS
  Filled 2023-03-13: qty 2

## 2023-03-13 MED ORDER — SODIUM CHLORIDE 0.9% IV SOLUTION: Freq: Once | INTRAVENOUS | Status: AC

## 2023-03-13 MED ORDER — INSULIN ASPART 100 UNIT/ML IJ SOLN
0.0000 [IU] | INTRAMUSCULAR | Status: DC
  Administered 2023-03-14: 2 [IU] via SUBCUTANEOUS
  Administered 2023-03-14: 7 [IU] via SUBCUTANEOUS
  Administered 2023-03-14: 2 [IU] via SUBCUTANEOUS
  Administered 2023-03-14: 5 [IU] via SUBCUTANEOUS
  Administered 2023-03-15: 2 [IU] via SUBCUTANEOUS
  Administered 2023-03-15: 1 [IU] via SUBCUTANEOUS
  Administered 2023-03-15 (×2): 5 [IU] via SUBCUTANEOUS
  Administered 2023-03-15 – 2023-03-16 (×2): 7 [IU] via SUBCUTANEOUS
  Administered 2023-03-16: 3 [IU] via SUBCUTANEOUS
  Administered 2023-03-16: 7 [IU] via SUBCUTANEOUS
  Administered 2023-03-16 (×2): 5 [IU] via SUBCUTANEOUS
  Administered 2023-03-16: 2 [IU] via SUBCUTANEOUS
  Administered 2023-03-17: 3 [IU] via SUBCUTANEOUS
  Administered 2023-03-17: 5 [IU] via SUBCUTANEOUS
  Administered 2023-03-17 (×2): 7 [IU] via SUBCUTANEOUS
  Administered 2023-03-17: 5 [IU] via SUBCUTANEOUS
  Administered 2023-03-17: 9 [IU] via SUBCUTANEOUS
  Administered 2023-03-18 (×2): 3 [IU] via SUBCUTANEOUS
  Administered 2023-03-18: 9 [IU] via SUBCUTANEOUS
  Administered 2023-03-18 (×2): 7 [IU] via SUBCUTANEOUS
  Administered 2023-03-18: 5 [IU] via SUBCUTANEOUS
  Administered 2023-03-19 (×2): 2 [IU] via SUBCUTANEOUS

## 2023-03-13 MED ORDER — GLUCERNA SHAKE PO LIQD
237.0000 mL | Freq: Three times a day (TID) | ORAL | Status: DC
  Administered 2023-03-14 – 2023-03-18 (×12): 237 mL via ORAL

## 2023-03-13 MED ORDER — ONDANSETRON HCL 4 MG PO TABS
4.0000 mg | ORAL_TABLET | Freq: Four times a day (QID) | ORAL | Status: DC | PRN
  Administered 2023-03-15 – 2023-03-18 (×4): 4 mg via ORAL
  Filled 2023-03-13 (×4): qty 1

## 2023-03-13 MED ORDER — BUDESONIDE 3 MG PO CPEP
9.0000 mg | ORAL_CAPSULE | Freq: Every day | ORAL | Status: DC
  Administered 2023-03-14 – 2023-03-15 (×2): 9 mg via ORAL
  Filled 2023-03-13 (×3): qty 3

## 2023-03-13 MED ORDER — IOHEXOL 350 MG/ML SOLN
75.0000 mL | Freq: Once | INTRAVENOUS | Status: AC | PRN
  Administered 2023-03-14: 75 mL via INTRAVENOUS

## 2023-03-13 MED ORDER — PROCHLORPERAZINE EDISYLATE 10 MG/2ML IJ SOLN
10.0000 mg | Freq: Four times a day (QID) | INTRAMUSCULAR | Status: DC | PRN
  Administered 2023-03-17 (×2): 10 mg via INTRAVENOUS
  Filled 2023-03-13 (×2): qty 2

## 2023-03-13 MED ORDER — PANTOPRAZOLE 80MG IVPB - SIMPLE MED
80.0000 mg | Freq: Once | INTRAVENOUS | Status: AC
  Administered 2023-03-13: 80 mg via INTRAVENOUS
  Filled 2023-03-13: qty 100

## 2023-03-13 NOTE — ED Notes (Signed)
1st unit of PRBC's started.   Pt only has 1 access.

## 2023-03-13 NOTE — ED Provider Notes (Signed)
[place] EMERGENCY DEPARTMENT AT [facility] Provider Note   CSN: [id number] Arrival date & time: 03/13/23  1630     History {Add pertinent medical, surgical, social history, OB history to HPI:1} Chief Complaint  Patient presents with   Rectal Bleeding    GRAYCI KELEMEN is a 72 y.o. female.  Today not on ac No anemia sxs No abdominalpain       Home Medications Prior to Admission medications   Medication Sig Start Date End Date Taking? Authorizing Provider  allopurinol (ZYLOPRIM) 100 MG tablet Take 100 mg by mouth daily. 03/20/21   [provider]  atorvastatin (LIPITOR) 80 MG tablet Take 1 tablet (80 mg total) by mouth daily. Restart on 06/29/22 Patient taking differently: Take 80 mg by mouth at bedtime. Restart on 06/29/22 06/22/22   Catarina Hartshorn, MD  budesonide (ENTOCORT EC) 3 MG 24 hr capsule Take 3 capsules (9 mg total) by mouth daily. 03/10/23   Vassie Loll, MD  esomeprazole (NEXIUM) 40 MG capsule Take 1 capsule (40 mg total) by mouth 2 (two) times daily before a meal. 02/25/23 03/27/23  Shahmehdi, Gemma Payor, MD  insulin aspart (NOVOLOG) 100 UNIT/ML FlexPen Inject 2 Units into the skin 3 (three) times daily with meals. Give only if eats 50% or more of meal. Patient taking differently: Inject 2-5 Units into the skin 3 (three) times daily with meals. Give only if eats 50% or more of meal. 02/11/23   Johnson, Clanford L, MD  insulin glargine-yfgn (SEMGLEE) 100 UNIT/ML Pen Inject 5 Units into the skin daily. 03/09/23   Vassie Loll, MD  lactobacillus acidophilus & bulgar (LACTINEX) chewable tablet Chew 1 tablet by mouth 3 (three) times daily with meals for 10 days. 03/09/23 03/19/23  Vassie Loll, MD  levETIRAcetam (KEPPRA) 500 MG tablet Take 500 mg by mouth 2 (two) times daily.    [provider]  metoprolol succinate (TOPROL-XL) 25 MG 24 hr tablet Take 1.5 tablets (37.5 mg total) by mouth daily. 02/11/23   Johnson, Clanford L, MD  Nutritional  Supplements (,FEEDING SUPPLEMENT, PROSOURCE PLUS) liquid Take 30 mLs by mouth 3 (three) times daily between meals. 02/25/23 03/27/23  Shahmehdi, Gemma Payor, MD  ondansetron (ZOFRAN-ODT) 4 MG disintegrating tablet Take 4 mg by mouth every 6 (six) hours as needed. 01/21/23   [provider]  sodium bicarbonate 650 MG tablet Take 1 tablet (650 mg total) by mouth 2 (two) times daily. 03/09/23   Vassie Loll, MD  venlafaxine XR (EFFEXOR-XR) 150 MG 24 hr capsule Take 150 mg by mouth daily. 07/11/22   [provider]      Allergies    Cozaar [losartan], Quinolones, Cymbalta [duloxetine hcl], Sulfa antibiotics, Synalar [fluocinolone], Cipro [ciprofloxacin hcl], Diovan [valsartan], Glucophage [metformin], Ketalar [ketamine], Norco [hydrocodone-acetaminophen], Norvasc [amlodipine besylate], Nsaids, Tape, and Zestril [lisinopril]    Review of Systems   Review of Systems  Physical Exam Updated Vital Signs BP 113/82 (BP Location: Left Arm)   Pulse (!) 105   Temp 98.6 F (37 C) (Oral)   Resp 12   Ht 5\' 4"  (1.626 m)   Wt 54.4 kg   LMP  (LMP Unknown)   SpO2 96%   BMI 20.60 kg/m  Physical Exam  ED Results / Procedures / Treatments   Labs (all labs ordered are listed, but only abnormal results are displayed) Labs Reviewed  COMPREHENSIVE METABOLIC PANEL - Abnormal; Notable for the following components:      Result Value   Sodium 132 (*)  CO2 20 (*)    Glucose, Bld 311 (*)    BUN 73 (*)    Creatinine, Ser 1.72 (*)    Calcium 7.8 (*)    Total Protein 4.5 (*)    Albumin 1.9 (*)    GFR, Estimated 32 (*)    All other components within normal limits  CBC - Abnormal; Notable for the following components:   WBC 12.3 (*)    RBC 2.00 (*)    Hemoglobin 6.3 (*)    HCT 20.1 (*)    MCV 100.5 (*)    RDW 16.4 (*)    All other components within normal limits  POC OCCULT BLOOD, ED  TYPE AND SCREEN    EKG None  Radiology No results found.  Procedures Procedures  {Document  cardiac monitor, telemetry assessment procedure when appropriate:1}  Medications Ordered in ED Medications - No data to display  ED Course/ Medical Decision Making/ A&P   {   Click here for ABCD2, HEART and other calculatorsREFRESH Note before signing :1}                          Medical Decision Making Amount and/or Complexity of Data Reviewed Labs: ordered.  Risk Decision regarding hospitalization.   ***  {Document critical care time when appropriate:1} {Document review of labs and clinical decision tools ie heart score, Chads2Vasc2 etc:1}  {Document your independent review of radiology images, and any outside records:1} {Document your discussion with family members, caretakers, and with consultants:1} {Document social determinants of health affecting pt's care:1} {Document your decision making why or why not admission, treatments were needed:1} Final Clinical Impression(s) / ED Diagnoses Final diagnoses:  None    Rx / DC Orders ED Discharge Orders     None

## 2023-03-13 NOTE — ED Notes (Signed)
1st unit of PRBC's started.   Pt only has 1 IV access due to difficulty even on ultrasound.  ED Dr aware. And stated give Ca between units.

## 2023-03-13 NOTE — ED Notes (Signed)
PRBC's infusing well without any s/s of reaction.

## 2023-03-13 NOTE — ED Notes (Signed)
Awaiting to get ultrasound IV access

## 2023-03-13 NOTE — ED Triage Notes (Signed)
Pt with one bright red stool today. Denies any pain.

## 2023-03-13 NOTE — H&P (Signed)
History and Physical    Patient: Heather [name] ZOX:[id number] DOB: Jan 16, 1951 DOA: 03/13/2023 DOS: the patient was seen and examined on 03/13/2023 PCP: [name], MD  Patient coming from: Home  Chief Complaint:  Chief Complaint  Patient presents with   Rectal Bleeding   HPI: Heather [name] is a 72 y.o. female with medical history significant of chronic disease, hypertension, T2DM, diastolic CHF, CKD stage IV, history of stroke and GI bleed who presents to the emergency department due to rectal bleeding. Patient was admitted from 4/30 through 5/11 due to acute GI bleed and prior to these, she was admitted from 4/21 through 4/29 due to GI bleed during which she required 3 units of blood due to anemia of 3.2.  She  underwent EGD and flex sig 4/23 and repeat EGD 4/26 due to persistent bleeding.  EGD 4/23 showed esophageal ulcers esophagitis, oozing duodenal ulcers and clots -she had injection and clips.  EGD 4/26 showed visible vessel with ulcer base, epi and clip placement.  Since discharge on 5/11, bowel movement has been normal until yesterday when she had 2 episodes of bright red blood per rectum and she decided to go to the ED for further evaluation and management.  ED Course:  In the emergency department, she was tachycardic with HR of 105 bpm, but other vital signs are within normal range.  Workup in the ED showed WBC 12.3, hemoglobin of 6.3, hematocrit 20.1, MCV 100.5, platelets 272.  BMP showed sodium 132, potassium 4.9, chloride 105, bicarb 20, blood glucose 311, BUN 73, creatinine 1.70, albumin 1.9. Patient was started on Protonix drip.  2 units of RBCs was ordered to be transfused in the ED.  Gastroenterologist was consulted and [facility] was asked to admit patient for further evaluation and management.  Review of Systems: Review of systems as noted in the HPI. All other systems reviewed and are negative.   Past Medical History:  Diagnosis Date   Acute renal failure  superimposed on stage 3 chronic kidney disease (HCC) 01/21/2016   Anemia    Anxiety    B12 deficiency    Brain tumor (benign) (HCC) 10/29/2020   Cellulitis and abscess    Abdomen and buttocks   Chronic diastolic CHF (congestive heart failure) (HCC) 03/16/2022   CKD (chronic kidney disease) stage 3, GFR 30-59 ml/min (HCC)    Closed fracture of distal end of femur, unspecified fracture morphology, initial encounter (HCC)    Coronary arteriosclerosis 07/15/2018   Crohn disease (HCC)    Depression    Diabetic neuropathy (HCC)    feet   DJD (degenerative joint disease)    Right forminal stenosis C4-5   Essential hypertension    Femur fracture, right (HCC) 03/30/2017   Folliculitis    GERD (gastroesophageal reflux disease)    Gout    Headache(784.0)    Hiatal hernia    History of blood transfusion    History of bronchitis    History of cardiac catheterization    No significant CAD 2012   History of kidney stones    surgery to remove   Insomnia    Irritable bowel syndrome    Mixed hyperlipidemia    Nausea & vomiting 12/22/2014   Osteoarthritis    Palpitations    Pneumonia    Reflux esophagitis    Salmonella    Stroke [facility])    Tinnitus    Right   Type 2 diabetes mellitus (HCC)    Past Surgical History:  Procedure Laterality  Date   BACK SURGERY     BIOPSY  02/19/2023   Procedure: BIOPSY;  Surgeon: Dolores Frame, MD;  [facility];  Service: Gastroenterology;;   c-spine surgery     03/2007   CARDIAC CATHETERIZATION     CHOLECYSTECTOMY     COLONOSCOPY  04/21/2011; 05/29/11   6/12: [place] - ?AVM at IC valve, inflammatory changes at IC valve [facility]); 7/12 - [facility]; IC valve erosions, look chronic and probably Crohn's per path   colonoscopy  2017   [place]: random biopsies normal. TI normal   COLONOSCOPY WITH PROPOFOL N/A 03/01/2023   Procedure: COLONOSCOPY WITH PROPOFOL;  Surgeon: Dolores Frame, MD;  [facility];   Service: Gastroenterology;  Laterality: N/A;  ileocolonoscopy   ENTEROSCOPY N/A 03/06/2023   Procedure: ENTEROSCOPY;  Surgeon: Corbin Ade, MD;  [facility];  Service: Endoscopy;  Laterality: N/A;   ESOPHAGOGASTRODUODENOSCOPY  05/29/11   normal   ESOPHAGOGASTRODUODENOSCOPY N/A 01/21/2016   Dr. Bosie Clos: normal   ESOPHAGOGASTRODUODENOSCOPY (EGD) WITH PROPOFOL N/A 02/19/2023   Procedure: ESOPHAGOGASTRODUODENOSCOPY (EGD) WITH PROPOFOL;  Surgeon: Dolores Frame, MD;  [facility];  Service: Gastroenterology;  Laterality: N/A;   ESOPHAGOGASTRODUODENOSCOPY (EGD) WITH PROPOFOL N/A 02/22/2023   Procedure: ESOPHAGOGASTRODUODENOSCOPY (EGD) WITH PROPOFOL;  Surgeon: Corbin Ade, MD;  [facility];  Service: Endoscopy;  Laterality: N/A;   ESOPHAGOGASTRODUODENOSCOPY (EGD) WITH PROPOFOL N/A 02/28/2023   Procedure: ESOPHAGOGASTRODUODENOSCOPY (EGD) WITH PROPOFOL;  Surgeon: Corbin Ade, MD;  [facility];  Service: Endoscopy;  Laterality: N/A;   EYE SURGERY Bilateral    cataracts removed   FEMUR IM NAIL Right 04/10/2018   Procedure: INTRAMEDULLARY (IM) NAIL FEMORAL;  Surgeon: Durene Romans, MD;  Location: [facility];  Service: Orthopedics;  Laterality: Right;   FLEXIBLE SIGMOIDOSCOPY N/A 02/19/2023   Procedure: FLEXIBLE SIGMOIDOSCOPY;  Surgeon: Dolores Frame, MD;  [facility];  Service: Gastroenterology;  Laterality: N/A;   FRACTURE SURGERY     GIVENS CAPSULE STUDY  11/17/2020   GIVENS CAPSULE STUDY N/A 03/04/2023   Procedure: GIVENS CAPSULE STUDY;  Surgeon: Lanelle Bal, DO;  [facility];  Service: Endoscopy;  Laterality: N/A;   HARDWARE REMOVAL Right 04/10/2018   Procedure: HARDWARE REMOVAL RIGHT DISTAL FEMUR;  Surgeon: Durene Romans, MD;  Location: [facility];  Service: Orthopedics;  Laterality: Right;   HEMOSTASIS CLIP PLACEMENT  02/19/2023   Procedure: HEMOSTASIS CLIP PLACEMENT;  Surgeon: Dolores Frame,  MD;  [facility];  Service: Gastroenterology;;   HEMOSTASIS CLIP PLACEMENT  02/22/2023   Procedure: HEMOSTASIS CLIP PLACEMENT;  Surgeon: Corbin Ade, MD;  [facility];  Service: Endoscopy;;  Resolution clip free floating, Mantis clip intact with mucosa   HEMOSTASIS CONTROL  02/19/2023   Procedure: HEMOSTASIS CONTROL;  Surgeon: Dolores Frame, MD;  [facility];  Service: Gastroenterology;;   HERNIA REPAIR     IR FLUORO GUIDE CV LINE RIGHT  04/01/2017   IR US GUIDE VASC ACCESS RIGHT  04/01/2017   JOINT REPLACEMENT Right    hip   OPEN REDUCTION INTERNAL FIXATION (ORIF) DISTAL RADIAL FRACTURE Right 05/28/2019   Procedure: OPEN REDUCTION INTERNAL FIXATION (ORIF) DISTAL RADIAL FRACTURE;  Surgeon: Ernest Mallick, MD;  Location: [facility];  Service: Orthopedics;  Laterality: Right;  with block 90 minutes   ORIF FEMUR FRACTURE Right 03/31/2017   Procedure: OPEN REDUCTION INTERNAL FIXATION (ORIF) DISTAL FEMUR FRACTURE;  Surgeon:  Durene Romans, MD;  Location: [facility] OR;  Service: Orthopedics;  Laterality: Right;   ORIF FEMUR FRACTURE Right 12/14/2022   Procedure: OPEN REDUCTION INTERNAL FIXATION (ORIF) DISTAL FEMUR FRACTURE;  Surgeon: Roby Lofts, MD;  Location: [facility];  Service: Orthopedics;  Laterality: Right;   removal of kidney stone     Right knee arthroscopic surgery     SCLEROTHERAPY  02/22/2023   Procedure: SCLEROTHERAPY;  Surgeon: Corbin Ade, MD;  [facility];  Service: Endoscopy;;   TONSILLECTOMY     TOTAL ABDOMINAL HYSTERECTOMY      Social History:  reports that she has never smoked. She has never used smokeless tobacco. She reports that she does not drink alcohol and does not use drugs.   Allergies  Allergen Reactions   Cozaar [Losartan] Nausea And Vomiting, Swelling and Rash   Quinolones Itching, Nausea And Vomiting, Nausea Only and Swelling    Swelling of face, jaw, and lips    Cymbalta [Duloxetine Hcl] Itching and  Swelling   Sulfa Antibiotics Nausea And Vomiting and Swelling    Headache  Swelling of feet, legs    Synalar [Fluocinolone] Swelling   Cipro [Ciprofloxacin Hcl] Nausea And Vomiting and Swelling    Swelling of face, jaw, lips   Diovan [Valsartan] Nausea And Vomiting and Swelling   Glucophage [Metformin] Other (See Comments)    GI upset   Ketalar [Ketamine] Swelling   Norco [Hydrocodone-Acetaminophen] Itching and Swelling   Norvasc [Amlodipine Besylate] Itching and Swelling    Fluid retention    Nsaids Nausea And Vomiting and Other (See Comments)    Has bleeding ulcers   Tape Itching and Rash    Paper tape can only be used on this patient   Zestril [Lisinopril] Swelling and Rash    Oral swelling Red streaks on arms/legs    Family History  Problem Relation Age of Onset   Diabetes Mother    Colon cancer Brother        POSSIBLY. Patient states diagnosed at age 19, then later states age 89. unclear and limited historian   Esophageal cancer Neg Hx    Rectal cancer Neg Hx    Stomach cancer Neg Hx      Prior to Admission medications   Medication Sig Start Date End Date Taking? Authorizing Provider  allopurinol (ZYLOPRIM) 100 MG tablet Take 100 mg by mouth daily. 03/20/21   [provider]  atorvastatin (LIPITOR) 80 MG tablet Take 1 tablet (80 mg total) by mouth daily. Restart on 06/29/22 Patient taking differently: Take 80 mg by mouth at bedtime. Restart on 06/29/22 06/22/22   Catarina Hartshorn, MD  budesonide (ENTOCORT EC) 3 MG 24 hr capsule Take 3 capsules (9 mg total) by mouth daily. 03/10/23   Vassie Loll, MD  esomeprazole (NEXIUM) 40 MG capsule Take 1 capsule (40 mg total) by mouth 2 (two) times daily before a meal. 02/25/23 03/27/23  Shahmehdi, Gemma Payor, MD  insulin aspart (NOVOLOG) 100 UNIT/ML FlexPen Inject 2 Units into the skin 3 (three) times daily with meals. Give only if eats 50% or more of meal. Patient taking differently: Inject 2-5 Units into the skin 3 (three) times daily  with meals. Give only if eats 50% or more of meal. 02/11/23   Johnson, Clanford L, MD  insulin glargine-yfgn (SEMGLEE) 100 UNIT/ML Pen Inject 5 Units into the skin daily. 03/09/23   Vassie Loll, MD  lactobacillus acidophilus & bulgar (LACTINEX) chewable tablet Chew 1 tablet by mouth 3 (three)  times daily with meals for 10 days. 03/09/23 03/19/23  Vassie Loll, MD  levETIRAcetam (KEPPRA) 500 MG tablet Take 500 mg by mouth 2 (two) times daily.    [provider]  metoprolol succinate (TOPROL-XL) 25 MG 24 hr tablet Take 1.5 tablets (37.5 mg total) by mouth daily. 02/11/23   Johnson, Clanford L, MD  Nutritional Supplements (,FEEDING SUPPLEMENT, PROSOURCE PLUS) liquid Take 30 mLs by mouth 3 (three) times daily between meals. 02/25/23 03/27/23  Shahmehdi, Gemma Payor, MD  ondansetron (ZOFRAN-ODT) 4 MG disintegrating tablet Take 4 mg by mouth every 6 (six) hours as needed. 01/21/23   [provider]  sodium bicarbonate 650 MG tablet Take 1 tablet (650 mg total) by mouth 2 (two) times daily. 03/09/23   Vassie Loll, MD  venlafaxine XR (EFFEXOR-XR) 150 MG 24 hr capsule Take 150 mg by mouth daily. 07/11/22   [provider]    Physical Exam: BP (!) 144/70   Pulse 100   Temp 98.1 F (36.7 C)   Resp 18   Ht 5\' 4"  (1.626 m)   Wt 54.4 kg   LMP  (LMP Unknown)   SpO2 100%   BMI 20.60 kg/m   General: 72 y.o. year-old female well developed well nourished in no acute distress.  Alert and oriented x3. HEENT: NCAT, EOMI Neck: Supple, trachea medial Cardiovascular: Regular rate and rhythm with no rubs or gallops.  No thyromegaly or JVD noted.  No lower extremity edema. 2/4 pulses in all 4 extremities. Respiratory: Clear to auscultation with no wheezes or rales. Good inspiratory effort. Abdomen: Soft, nontender nondistended with normal bowel sounds x4 quadrants. Muskuloskeletal: Left leg swelling.  No cyanosis, clubbing or edema noted bilaterally Neuro: CN II-XII intact, strength 5/5 x 4,  sensation, reflexes intact Skin: No ulcerative lesions noted or rashes Psychiatry: Judgement and insight appear normal. Mood is appropriate for condition and setting          Labs on Admission:  Basic Metabolic Panel: Recent Labs  Lab 03/07/23 0952 03/09/23 0500 03/13/23 1756  NA 132* 131* 132*  K 5.0 4.7 4.9  CL 107 106 105  CO2 18* 20* 20*  GLUCOSE 353* 60* 311*  BUN 46* 50* 73*  CREATININE 1.55* 1.29* 1.72*  CALCIUM 8.3* 8.3* 7.8*   Liver Function Tests: Recent Labs  Lab 03/13/23 1756  AST 33  ALT 22  ALKPHOS 79  BILITOT 0.6  PROT 4.5*  ALBUMIN 1.9*   No results for input(s): "LIPASE", "AMYLASE" in the last 168 hours. No results for input(s): "AMMONIA" in the last 168 hours. CBC: Recent Labs  Lab 03/07/23 0952 03/09/23 0500 03/13/23 1756  WBC 8.8 6.9 12.3*  HGB 9.7* 8.4* 6.3*  HCT 29.2* 25.3* 20.1*  MCV 93.6 93.7 100.5*  PLT 189 160 272   Cardiac Enzymes: No results for input(s): "CKTOTAL", "CKMB", "CKMBINDEX", "TROPONINI" in the last 168 hours.  BNP (last 3 results) No results for input(s): "BNP" in the last 8760 hours.  ProBNP (last 3 results) No results for input(s): "PROBNP" in the last 8760 hours.  CBG: Recent Labs  Lab 03/08/23 2338 03/09/23 0304 03/09/23 0714 03/09/23 0753 03/09/23 1118  GLUCAP 161* 93 53* 71 142*    Radiological Exams on Admission: No results found.  EKG: I independently viewed the EKG done and my findings are as followed: EKG was not done in the ED  Assessment/Plan Present on Admission:  GI bleed  ABLA (acute blood loss anemia)  Type 2 diabetes mellitus with hyperglycemia (  HCC)  Hypoalbuminemia due to protein-calorie malnutrition (HCC)  Chronic diastolic CHF (congestive heart failure) (HCC)  ? Crohn's disease  Mixed hyperlipidemia  Principal Problem:   GI bleed Active Problems:   ABLA (acute blood loss anemia)   Chronic diastolic CHF (congestive heart failure) (HCC)   Hypoalbuminemia due to  protein-calorie malnutrition (HCC)   ? Crohn's disease   Mixed hyperlipidemia   Type 2 diabetes mellitus with hyperglycemia (HCC)   Elevated MCV   Left leg swelling  GI bleed Acute blood loss anemia Hemoglobin was 6.3 today FOBT was positive Type and screen was done and 2 units of PRBC was ordered to be transfused in the ED EDP spoke with gastroenterology (Dr. Jena Gauss) who will see patient in the morning. Continue Protonix drip Continue clear liquid diet and transition to n.p.o. at midnight  Elevated MCV MCV 100.5, folate and vitamin B12 levels will be checked  Left leg swelling Ultrasound of left leg will be done in the morning  Type 2 diabetes mellitus with hyperglycemia Hemoglobin A1c was 5.7 on 4/30 Continue ISS and hypoglycemia protocol  Hypoalbuminemia possibly secondary to severe protein calorie malnutrition Abdominal appointment, protein supplement will be provided  Chronic diastolic CHF Last echocardiogram done in February 2024 showed LVEF of 65%.  G1 DD Patient is stable and compensated  Essential hypertension (controlled) Continue home meds  Chronic kidney disease stage IV Creatinine at 1.72, this was 1.73 on discharge on 03/09/2023 Renally adjust medications, avoid nephrotoxic agents/dehydration/hypotension  Crohn's disease of small intestine without complication Stable.  Continue Entocort Continue to have gastroenterology follow-up  Mixed hyperlipidemia Continue Lipitor  DVT prophylaxis: SCDs  Advance Care Planning: Full code  Consults: Gastroenterology (by EDP)  Family Communication: None at bedside  Severity of Illness: The appropriate patient status for this patient is OBSERVATION. Observation status is judged to be reasonable and necessary in order to provide the required intensity of service to ensure the patient's safety. The patient's presenting symptoms, physical exam findings, and initial radiographic and laboratory data in the context of  their medical condition is felt to place them at decreased risk for further clinical deterioration. Furthermore, it is anticipated that the patient will be medically stable for discharge from the hospital within 2 midnights of admission.   Author: [name], DO 03/13/2023 10:40 PM  For on call review [email or web address].

## 2023-03-13 NOTE — ED Notes (Signed)
Going to 320  report called.  1st unit infusing without no s/s of reaction. Husband went home for the night

## 2023-03-14 ENCOUNTER — Observation Stay (HOSPITAL_COMMUNITY): Payer: Medicare PPO

## 2023-03-14 ENCOUNTER — Other Ambulatory Visit: Payer: Self-pay

## 2023-03-14 ENCOUNTER — Encounter (HOSPITAL_COMMUNITY): Payer: Self-pay | Admitting: Internal Medicine

## 2023-03-14 DIAGNOSIS — K2971 Gastritis, unspecified, with bleeding: Secondary | ICD-10-CM

## 2023-03-14 DIAGNOSIS — Z5111 Encounter for antineoplastic chemotherapy: Secondary | ICD-10-CM

## 2023-03-14 DIAGNOSIS — K921 Melena: Secondary | ICD-10-CM | POA: Diagnosis not present

## 2023-03-14 DIAGNOSIS — Z7189 Other specified counseling: Secondary | ICD-10-CM | POA: Diagnosis not present

## 2023-03-14 DIAGNOSIS — Z515 Encounter for palliative care: Secondary | ICD-10-CM | POA: Diagnosis not present

## 2023-03-14 DIAGNOSIS — E8809 Other disorders of plasma-protein metabolism, not elsewhere classified: Secondary | ICD-10-CM | POA: Diagnosis not present

## 2023-03-14 DIAGNOSIS — H9220 Otorrhagia, unspecified ear: Secondary | ICD-10-CM | POA: Diagnosis not present

## 2023-03-14 DIAGNOSIS — K5000 Crohn's disease of small intestine without complications: Secondary | ICD-10-CM | POA: Diagnosis not present

## 2023-03-14 DIAGNOSIS — K6389 Other specified diseases of intestine: Secondary | ICD-10-CM | POA: Diagnosis not present

## 2023-03-14 DIAGNOSIS — D62 Acute posthemorrhagic anemia: Secondary | ICD-10-CM | POA: Diagnosis not present

## 2023-03-14 DIAGNOSIS — I700 Atherosclerosis of aorta: Secondary | ICD-10-CM | POA: Diagnosis not present

## 2023-03-14 DIAGNOSIS — R600 Localized edema: Secondary | ICD-10-CM | POA: Diagnosis not present

## 2023-03-14 DIAGNOSIS — K922 Gastrointestinal hemorrhage, unspecified: Secondary | ICD-10-CM | POA: Diagnosis not present

## 2023-03-14 DIAGNOSIS — E46 Unspecified protein-calorie malnutrition: Secondary | ICD-10-CM | POA: Diagnosis not present

## 2023-03-14 DIAGNOSIS — I5032 Chronic diastolic (congestive) heart failure: Secondary | ICD-10-CM | POA: Diagnosis not present

## 2023-03-14 LAB — COMPREHENSIVE METABOLIC PANEL
ALT: 19 U/L (ref 0–44)
AST: 25 U/L (ref 15–41)
Albumin: 1.8 g/dL — ABNORMAL LOW (ref 3.5–5.0)
Alkaline Phosphatase: 69 U/L (ref 38–126)
Anion gap: 7 (ref 5–15)
BUN: 75 mg/dL — ABNORMAL HIGH (ref 8–23)
CO2: 18 mmol/L — ABNORMAL LOW (ref 22–32)
Calcium: 7.7 mg/dL — ABNORMAL LOW (ref 8.9–10.3)
Chloride: 107 mmol/L (ref 98–111)
Creatinine, Ser: 1.74 mg/dL — ABNORMAL HIGH (ref 0.44–1.00)
GFR, Estimated: 31 mL/min — ABNORMAL LOW (ref >60)
Glucose, Bld: 291 mg/dL — ABNORMAL HIGH (ref 70–99)
Potassium: 4.7 mmol/L (ref 3.5–5.1)
Sodium: 132 mmol/L — ABNORMAL LOW (ref 135–145)
Total Bilirubin: 0.6 mg/dL (ref 0.3–1.2)
Total Protein: 4.1 g/dL — ABNORMAL LOW (ref 6.5–8.1)

## 2023-03-14 LAB — URINALYSIS, ROUTINE W REFLEX MICROSCOPIC
Bilirubin Urine: NEGATIVE
Glucose, UA: NEGATIVE mg/dL
Hgb urine dipstick: NEGATIVE
Ketones, ur: NEGATIVE mg/dL
Nitrite: POSITIVE — AB
Protein, ur: 100 mg/dL — AB
Specific Gravity, Urine: 1.016 (ref 1.005–1.030)
WBC, UA: >50 WBC/hpf (ref 0–5)
pH: 5 (ref 5.0–8.0)

## 2023-03-14 LAB — CBC
HCT: 21.2 % — ABNORMAL LOW (ref 36.0–46.0)
Hemoglobin: 7 g/dL — ABNORMAL LOW (ref 12.0–15.0)
MCH: 31.1 pg (ref 26.0–34.0)
MCHC: 33 g/dL (ref 30.0–36.0)
MCV: 94.2 fL (ref 80.0–100.0)
Platelets: 146 10*3/uL — ABNORMAL LOW (ref 150–400)
RBC: 2.25 MIL/uL — ABNORMAL LOW (ref 3.87–5.11)
RDW: 15.9 % — ABNORMAL HIGH (ref 11.5–15.5)
WBC: 7.8 10*3/uL (ref 4.0–10.5)
nRBC: 0 % (ref 0.0–0.2)

## 2023-03-14 LAB — TYPE AND SCREEN

## 2023-03-14 LAB — GLUCOSE, CAPILLARY
Glucose-Capillary: 158 mg/dL — ABNORMAL HIGH (ref 70–99)
Glucose-Capillary: 167 mg/dL — ABNORMAL HIGH (ref 70–99)
Glucose-Capillary: 294 mg/dL — ABNORMAL HIGH (ref 70–99)
Glucose-Capillary: 295 mg/dL — ABNORMAL HIGH (ref 70–99)
Glucose-Capillary: 329 mg/dL — ABNORMAL HIGH (ref 70–99)
Glucose-Capillary: 51 mg/dL — ABNORMAL LOW (ref 70–99)
Glucose-Capillary: 60 mg/dL — ABNORMAL LOW (ref 70–99)
Glucose-Capillary: 68 mg/dL — ABNORMAL LOW (ref 70–99)
Glucose-Capillary: 79 mg/dL (ref 70–99)

## 2023-03-14 LAB — FOLATE: Folate: 7.4 ng/mL (ref >5.9)

## 2023-03-14 LAB — BPAM RBC
Blood Product Expiration Date: 202406122359
ISSUE DATE / TIME: 202405152021
ISSUE DATE / TIME: 202405152342
Unit Type and Rh: 5100

## 2023-03-14 LAB — HEMOGLOBIN AND HEMATOCRIT, BLOOD
HCT: 21.3 % — ABNORMAL LOW (ref 36.0–46.0)
Hemoglobin: 7.1 g/dL — ABNORMAL LOW (ref 12.0–15.0)

## 2023-03-14 LAB — MAGNESIUM: Magnesium: 1.4 mg/dL — ABNORMAL LOW (ref 1.7–2.4)

## 2023-03-14 LAB — PHOSPHORUS: Phosphorus: 3.2 mg/dL (ref 2.5–4.6)

## 2023-03-14 LAB — VITAMIN B12: Vitamin B-12: 731 pg/mL (ref 180–914)

## 2023-03-14 MED ORDER — TECHNETIUM TC 99M-LABELED RED BLOOD CELLS IV KIT
25.0000 | PACK | Freq: Once | INTRAVENOUS | Status: AC | PRN
  Administered 2023-03-14: 25 via INTRAVENOUS

## 2023-03-14 MED ORDER — MAGNESIUM SULFATE 4 GM/100ML IV SOLN: 4.0000 g | Freq: Once | INTRAVENOUS | Status: DC

## 2023-03-14 MED ORDER — SODIUM CHLORIDE 0.9% FLUSH: 10.0000 mL | INTRAVENOUS | Status: DC | PRN

## 2023-03-14 MED ORDER — HEPARIN SOD (PORK) LOCK FLUSH 100 UNIT/ML IV SOLN
INTRAVENOUS | Status: AC
  Filled 2023-03-14: qty 5

## 2023-03-14 MED ORDER — FENTANYL CITRATE PF 50 MCG/ML IJ SOSY: 25.0000 ug | PREFILLED_SYRINGE | Freq: Once | INTRAMUSCULAR | Status: DC

## 2023-03-14 MED ORDER — ACETAMINOPHEN 325 MG PO TABS
650.0000 mg | ORAL_TABLET | Freq: Four times a day (QID) | ORAL | Status: DC | PRN
  Administered 2023-03-14 – 2023-03-19 (×8): 650 mg via ORAL
  Filled 2023-03-14 (×9): qty 2

## 2023-03-14 MED ORDER — SUCRALFATE 1 GM/10ML PO SUSP
1.0000 g | Freq: Three times a day (TID) | ORAL | Status: DC
  Administered 2023-03-14 – 2023-03-18 (×16): 1 g via ORAL
  Filled 2023-03-14 (×19): qty 10

## 2023-03-14 MED ORDER — CHLORHEXIDINE GLUCONATE CLOTH 2 % EX PADS
6.0000 | MEDICATED_PAD | Freq: Every day | CUTANEOUS | Status: DC
  Administered 2023-03-14 – 2023-03-18 (×5): 6 via TOPICAL

## 2023-03-14 MED ORDER — DEXTROSE 50 % IV SOLN
INTRAVENOUS | Status: AC
  Administered 2023-03-14: 50 mL
  Filled 2023-03-14: qty 50

## 2023-03-14 MED ORDER — SODIUM CHLORIDE 0.9% FLUSH
10.0000 mL | Freq: Two times a day (BID) | INTRAVENOUS | Status: DC
  Administered 2023-03-14 – 2023-03-18 (×10): 10 mL

## 2023-03-14 NOTE — Progress Notes (Signed)
Peripherally Inserted Central Catheter Placement  The IV Nurse has discussed with the patient and/or persons authorized to consent for the patient, the purpose of this procedure and the potential benefits and risks involved with this procedure.  The benefits include less needle sticks, lab draws from the catheter, and the patient may be discharged home with the catheter. Risks include, but not limited to, infection, bleeding, blood clot (thrombus formation), and puncture of an artery; nerve damage and irregular heartbeat and possibility to perform a PICC exchange if needed/ordered by physician.  Alternatives to this procedure were also discussed.  Bard Power PICC patient education guide, fact sheet on infection prevention and patient information card has been provided to patient /or left at bedside.    PICC Placement Documentation  PICC Triple Lumen 03/14/23 Left Brachial 44 cm 1 cm (Active)  Indication for Insertion or Continuance of Line Poor Vasculature-patient has had multiple peripheral attempts or PIVs lasting less than 24 hours 03/14/23 1230  Exposed Catheter (cm) 1 cm 03/14/23 1230  Site Assessment Clean, Dry, Intact 03/14/23 1230  Lumen #1 Status Flushed;Saline locked;Blood return noted 03/14/23 1230  Lumen #2 Status Flushed;Saline locked;Blood return noted 03/14/23 1230  Lumen #3 Status Flushed;Blood return noted;Saline locked 03/14/23 1230  Dressing Type Transparent;Securing device 03/14/23 1230  Dressing Status Antimicrobial disc in place 03/14/23 1230  Safety Lock Not Applicable 03/14/23 1230  Line Adjustment (NICU/IV Team Only) No 03/14/23 1230  Dressing Change Due 03/21/23 03/14/23 1230   Large bruised area noted to left upper 15 cm  x 20 cm prior to PICC placement .Left Arm circumference is 32 cm.    Romie Jumper 03/14/2023, 12:33 PM

## 2023-03-14 NOTE — Progress Notes (Signed)
PROGRESS NOTE  Heather [name], is a 72 y.o. female, DOB - 02-05-1951, ZOX:[id number]  Admit date - 03/13/2023   Admitting Physician [name], DO  Outpatient Primary MD for the patient is [name], MD  LOS - 0  Chief Complaint  Patient presents with   Rectal Bleeding       Brief Narrative:  72 y.o. female with medical history significant for recurrent GI bleed, CKD 4, CVA, dCHF,  DM2, HTN, history of prior stroke with history of peptic ulcer disease with recent bleeding from duodenal ulcer,, small bowel Crohn's disease admitted on 03/13/2023 with  concerns about melena/rectal bleeding and hemoglobin down to 6.3  Dates/Pertinent events: 04/13/11: Presents for OV with Dr. [name] Payor with multiple GI complaints. EGD/Colonoscopy scheduled. 05/18/11: Gastric emptying study, 61% retention (abnormal). 05/29/11: EGD/Colonoscopy: Terminal ileal erosions seen and prescribed Entocort for possible Crohn's disease. Pathology showed IBD/erosisions. Unk date: Hosp at [facility] for abd pain, N/V. Treated with IV Solumedrol with improvement. Thought terminal ileal erosions may be due to NSAIDs. ESR 47. Reglan prescribed. 07/05/11: F/U OV with Dr. [name] Payor. No benefit from Entocort or Reglan, still reports bloating and vomiting despite small intake. Noted to have iron deficiency/B12 deficiency anemia. Reglan & Entocort d/c'd. Advised to avoid NSAIDs (Indomethacin). 07/17/11: Hospitalized for N/V/D, --- again found to be hypokalemic--- s/p capsule endo suggestive of Crohn's disease/gastroparesis. Treated with IV Zofran, Dilaudid, IV Reglan. 07/19/11: Underwent capsule endoscopy, found small bowel erosions and 1 aphthous ulcer thought to be from mild Crohn's disease. Entocort re-started. Amitriptyline started. Noted to have some issues with confusion which improved on d/c of Phenergan/narcotics. 01/13/15: Hospitalized at [facility] MRI brain neg. 01/21/16: Hospitalized with cyclic vomiting and GIB. EGD:  Prepyloric ulcer. H. Pylori neg. Once again, told to d/c Indocin. 08/21/16: OV with GI. Reported couldn't afford co-pays on meds prescribed at [facility]. Scheduled for EGD/colonoscopy. Rx: Reglan. 11/28/20: OV GI. Plan to start 6-MP and Humira vs. Cimzia. Testing done for Thiopurine methyltransferase, hep B, TB. Celiac testing neg. 12/01/20: Starter pack for Humira prescribed. "Passed capsule." 02/17/23: Hosp for GIB, hgb 3.2. EGD/Sigmoidoscopy showed esophageal and duodenal ulcers. Repeat EGD 02/22/23 d/t ongoing bleeding. Ongoing NSAID use noted. 02/26/23: Colonoscopy 03/01/23 neg for inflammation.  03/06/23--- EGD with push enteroscopy done on  03/06/23: Friable mucosa, mesenteric ischemia. Discharge on 03/09/2023 -Readmitted on 03/13/2023   -Assessment and Plan: 1)Acute GI bleed -- Hgb up to 7.0 from 6.3 after transfusion of 1 unit of PRBC on 03/13/2023  S/p recent  EGD and flex sig 4/23 and repeat EGD 4/26 due to persistent bleeding Initial EGD from 02/19/2023 showed esophagitis with ulcers as well extensive duodenal ulceration on initial EGD.  Visible vessel treated with epinephrine and hemostasis clip.  She developed recurrent bleeding  - underwent a repeat EGD on 02/22/23 which revealed a visible vessel at a different site in the ulcer crater requiring epinephrine and clip placement.   -H. pylori negative - Limited FS on 02/22/2023 was with  poor prep nondiagnostic. ---Colonoscopy on 03/01/2023 shows no presence of inflammation hematin or active bleeding was present thorughout the colon.  -03/06/23--- EGD with push enteroscopy done on  03/06/23: Friable mucosa, mesenteric ischemia. -CT angio GI bleed from 03/14/2023 shows evidence of duodenitis, without acute intra-abdominal bleeding identified -RBC nuclear tagged scan from 03/14/2023 without active bleeding -c/n IV Protonix and Carafate -GI consult appreciated   2)Chronic diastolic CHF (congestive heart failure) (HCC) Stable and compensated.  Last echo 11/2022, EF  of 60 to 65%, grade 1  DD.  Not on diuretic. -Monitor closely   3)Diabetes mellitus (HCC) -A1c 6.4 reflecting good diabetic control PTA -Hold Lantus until oral intake is more reliable Use Novolog/Humalog Sliding scale insulin with Accu-Cheks/Fingersticks as ordered    4)HTN Stable, continue metoprolol succinate 37.5 mg daily   5)Crohn's disease of small intestine without complication (HCC) Stable.  Previously treated with Humira currently not on medications for this -Recent biopsy during endoscopy was nondiagnostic -Restart Entocort   6) CKD stage -IV -Creatinine currently 1.7 continue to monitor closely especially due to contrast study -Renally adjust medications, avoid nephrotoxic agents / dehydration  / hypotension   7)Mild Hyponatremia, not POA----suspect dehydration related, monitor closely  8) acute on chronic anemia due to GI bleed--folate and B12 are not low --- Hgb up to 7.0 from 6.3 after transfusion of 1 unit of PRBC on 03/13/2023  9) social/ethics--- palliative care consult appreciated -Patient is a full code -Palliative care conference scheduled for Monday 07/19/23 at 9 AM with patient and family members   Status is: Inpatient   Disposition: The patient is from: Home              Anticipated d/c is to: Home              Anticipated d/c date is: 2 days              Patient currently is not medically stable to d/c. Barriers: Not Clinically Stable-   Code Status :  -  Code Status: Full Code   Family Communication:   Husband is primary contact  DVT Prophylaxis  :   - SCDs  SCDs Start: 03/13/23 2212   Lab Results  Component Value Date   PLT 146 (L) 03/14/2023    Inpatient Medications  Scheduled Meds:  atorvastatin  80 mg Oral Daily   budesonide  9 mg Oral Daily   Chlorhexidine Gluconate Cloth  6 each Topical Daily   feeding supplement (GLUCERNA SHAKE)  237 mL Oral TID BM   insulin aspart  0-9 Units Subcutaneous Q4H   metoprolol succinate  37.5 mg Oral  Daily   sodium chloride flush  10-40 mL Intracatheter Q12H   Continuous Infusions:  magnesium sulfate bolus IVPB     pantoprazole 8 mg/hr (03/14/23 1614)   PRN Meds:.acetaminophen, ondansetron **OR** ondansetron (ZOFRAN) IV, prochlorperazine, sodium chloride flush   Anti-infectives (From admission, onward)    None         Subjective: Heather [name] today has no fevers, no emesis,  No chest pain,   - Resting comfortably  had maroon stool Some abdominal discomfort but denies significant pain  Objective: Vitals:   03/14/23 1100 03/14/23 1200 03/14/23 1300 03/14/23 1400  BP: 115/85 (!) 132/116  (!) 115/48  Pulse: 78 86 90 85  Resp: 13 (!) 23 13 15   Temp:      TempSrc:      SpO2: 100% 100% 100% 100%  Weight:      Height:        Intake/Output Summary (Last 24 hours) at 03/14/2023 1624 Last data filed at 03/14/2023 0500 Gross per 24 hour  Intake 974.58 ml  Output --  Net 974.58 ml   Filed Weights   03/13/23 1650  Weight: 54.4 kg   Physical Exam  Gen:- Awake Alert, No conversational dyspnea HEENT:- [place].AT, No sclera icterus Neck-Supple Neck,No JVD,.  Lungs-  CTAB , fair symmetrical air movement CV- S1, S2 normal, regular  Abd-  +ve B.Sounds, Abd Soft, No  significant tenderness,    Extremity/Skin:-Significant bilateral upper extremity edema, pedal pulses present  Psych-affect is appropriate, oriented x3 Neuro-generalized weakness, no new focal deficits, no tremors MSK-left arm PICC line placed on 03/14/2023  Data Reviewed: I have personally reviewed following labs and imaging studies  CBC: Recent Labs  Lab 03/09/23 0500 03/13/23 1756 03/14/23 0445  WBC 6.9 12.3* 7.8  HGB 8.4* 6.3* 7.0*  HCT 25.3* 20.1* 21.2*  MCV 93.7 100.5* 94.2  PLT 160 272 146*   Basic Metabolic Panel: Recent Labs  Lab 03/09/23 0500 03/13/23 1756 03/14/23 0416  NA 131* 132* 132*  K 4.7 4.9 4.7  CL 106 105 107  CO2 20* 20* 18*  GLUCOSE 60* 311* 291*  BUN 50* 73* 75*   CREATININE 1.29* 1.72* 1.74*  CALCIUM 8.3* 7.8* 7.7*  MG  --   --  1.4*  PHOS  --   --  3.2   GFR: Estimated Creatinine Clearance: 25.8 mL/min (A) (by C-G formula based on SCr of 1.74 mg/dL (H)). Liver Function Tests: Recent Labs  Lab 03/13/23 1756 03/14/23 0416  AST 33 25  ALT 22 19  ALKPHOS 79 69  BILITOT 0.6 0.6  PROT 4.5* 4.1*  ALBUMIN 1.9* 1.8*   Radiology Studies: US Venous Img Lower Unilateral Left (DVT)  Result Date: 03/14/2023 CLINICAL DATA:  Left lower extremity edema. EXAM: LEFT LOWER EXTREMITY VENOUS DOPPLER ULTRASOUND TECHNIQUE: Gray-scale sonography with graded compression, as well as color Doppler and duplex ultrasound were performed to evaluate the lower extremity deep venous systems from the level of the common femoral vein and including the common femoral, femoral, profunda femoral, popliteal and calf veins including the posterior tibial, peroneal and gastrocnemius veins when visible. The superficial great saphenous vein was also interrogated. Spectral Doppler was utilized to evaluate flow at rest and with distal augmentation maneuvers in the common femoral, femoral and popliteal veins. COMPARISON:  None Available. FINDINGS: Contralateral Common Femoral Vein: Respiratory phasicity is normal and symmetric with the symptomatic side. No evidence of thrombus. Normal compressibility. Common Femoral Vein: No evidence of thrombus. Normal compressibility, respiratory phasicity and response to augmentation. Saphenofemoral Junction: No evidence of thrombus. Normal compressibility and flow on color Doppler imaging. Profunda Femoral Vein: No evidence of thrombus. Normal compressibility and flow on color Doppler imaging. Femoral Vein: No evidence of thrombus. Normal compressibility, respiratory phasicity and response to augmentation. Popliteal Vein: No evidence of thrombus. Normal compressibility, respiratory phasicity and response to augmentation. Calf Veins: No evidence of thrombus.  Normal compressibility and flow on color Doppler imaging. Superficial Great Saphenous Vein: No evidence of thrombus. Normal compressibility. Venous Reflux:  None. Other Findings: Fluid collection in the left popliteal fossa is consistent with a Baker's cyst and measures approximately 4 x 1.2 x 2.5 cm. IMPRESSION: 1. No evidence of left lower extremity deep vein thrombosis. 2. Left popliteal fossa Baker's cyst. Electronically Signed   By: Irish Lack M.D.   On: 03/14/2023 11:10   DG Abd 1 View  Result Date: 03/14/2023 CLINICAL DATA:  Foreign body of alimentary tract, initial encounter. Follow-up. EXAM: ABDOMEN - 1 VIEW COMPARISON:  KUB 03/06/2023 and 03/05/2023 FINDINGS: Air is seen within nondistended loops of small and large bowel. Right upper quadrant cholecystectomy clips are again seen. The prior approximate 1.1 cm density previously overlying the ascending colon within the right lower abdominal quadrant is no longer visualized. Mild stool within the ascending colon, decreased from prior. No portal venous gas or pneumatosis is seen, noting the superior aspect of  the liver is not imaged. Vascular phleboliths overlie the pelvis. There is diffuse decreased bone mineralization. Lumbar rod and screw fusion hardware and right femoral cephalomedullary nail fixation are again noted. IMPRESSION: 1. The prior approximate 1.1 cm density previously overlying the ascending colon within the right lower abdominal quadrant is no longer visualized. Presumably the endoscopy capsule has passed. 2. Nonobstructive bowel gas pattern. Electronically Signed   By: Neita Garnet M.D.   On: 03/14/2023 09:10   [place] EKG SITE RITE  Result Date: 03/14/2023 If Site Rite image not attached, placement could not be confirmed due to current cardiac rhythm.    Scheduled Meds:  atorvastatin  80 mg Oral Daily   budesonide  9 mg Oral Daily   Chlorhexidine Gluconate Cloth  6 each Topical Daily   feeding supplement (GLUCERNA SHAKE)  237  mL Oral TID BM   insulin aspart  0-9 Units Subcutaneous Q4H   metoprolol succinate  37.5 mg Oral Daily   sodium chloride flush  10-40 mL Intracatheter Q12H   Continuous Infusions:  magnesium sulfate bolus IVPB     pantoprazole 8 mg/hr (03/14/23 1614)     LOS: 0 days    Shon Hale M.D on 03/14/2023 at 4:24 PM  Go to [email or web address] - for contact info  [facility] - Office  [phone]  If 7PM-7AM, please contact night-coverage [email or web address] 03/14/2023, 4:24 PM

## 2023-03-14 NOTE — Telephone Encounter (Signed)
Noted  

## 2023-03-14 NOTE — Progress Notes (Signed)
PICC team at bedside to attempt PICC, assessing L arm because R arm is still broken according to patient. Upon assessment of the area, PICC RN shows me a 15x20 cm bruised area in the L upper arm area where a prior IV was attempted. PICC members will let me know if they are able to place one after assessing.

## 2023-03-14 NOTE — Progress Notes (Signed)
Progress Note  Patient with GI bleed has hemoglobin of 6.3 on admission, blood transfusion was started in the ED, unfortunately patient lost PIV and attempts to place new PIV with ultrasound failed.  Patient already received 1 unit of blood and was on second unit when she lost the PIV.  H/H was done and was 7.0.  PICC line was ordered to be placed this morning. Consider surgical consult for central line if line is needed prior to possibility of PICC line being placed.

## 2023-03-14 NOTE — Consult Note (Signed)
Gastroenterology Consult   Referring Provider: No ref. provider found Primary Care Physician:  [name], MD Primary Gastroenterologist:  Dr. [name] Payor  Patient ID: Heather [name]; [id number]; 11/07/1950   Admit date: 03/13/2023  LOS: 0 days   Date of Consultation: 03/14/2023  Reason for Consultation:  gi bleed    History of Present Illness   Heather [name] is a 72 y.o. female history of small bowel Crohn's disease diagnosed in January 2022 and previously on Humira, history of pancreatitis possibly related to hypertriglyceridemia and use of Victoza, CKD, depression, diabetes, GERD, anxiety, IBS, hyperlipidemia, stroke, acute on chronic anemia/GI bleeding in setting of ulcerative esophagitis, duodenal ulcer, small bowel erosions/friability with repeated hospitalizations presenting back with gi bleeding.  Most recent discharge May 11.  Extensive endoscopic evaluation as outlined below.  Discharge 5 days ago, her hemoglobin was 8.4.  Presented to the ED due to single episode of rectal bleeding per ED and admitting provider. Noted to have hemoglobin of 6.3.  Stool heme positive.  Received 1 unit of packed red blood cells before she lost IV access.  Hemoglobin up to 7.0.  B12 and folate normal.  Today her BUN is 75, creatinine 1.74.  At time of discharge 5 days ago her creatinine was 1.29.  Since admission she has time two small loose brown/reddish stools reported by nursing staff. Patient states her caregiver noted she passed a black stool yesterday. Patient is bedbound at this time. She uses bed pan and caregiver assistance. She does not see her stool. She reports two stools yesterday that were black. She states her appetite is poor. She has nausea frequently. No vomiting. She denies abdominal pain. She reports her chest hurts when she eats. She reports that she was able to get her medications at time of last discharge and taking accordingly.    Recent work up:  Small bowel  enteroscopy Mar 06, 2023: -Mild esophagitis -Normal-appearing stomach -Duodenal ulceration appears much improved, healing -Small bowel tip erosions and friability extending down to the proximal jejunum.  No treatable lesion found. -Clinical scenario not behaving like typical peptic ulcer disease.  Suspected either Crohn's disease of the small bowel or vascular insufficiency/ischemia.  Noncontrast abdominal CT reviewed with radiology, patient had a diminutive plaque at IMA, marked plaquing of the SMA and diminutive appearing celiac artery.  Mesenteric Dopplers unremarkable.  Patient started on Entocort 9 mg daily for possibility of Crohn's enteritis.  Small bowel capsule study Mar 05, 2023: Esophagitis, gastritis, ulcerated and erythematous small bowel most severe in the proximal small bowel.  Scattered erosions and/or small AVMs.  Evidence of bleeding with Wiss and droplets of red blood throughout the small bowel, but no specific bleeding lesion identified.  Capsule did not reach the cecum.  Colonoscopy Mar 01, 2023: -Terminal ileum appeared normal -Diverticula in the sigmoid colon. -Superficial pressure ulcers found on the perianal exam.  EGD Feb 28, 2023: -Esophagitis much improved -Duodenal ulcer disease much improved with significant healing of ulcer crater noted the week prior -Persistent hemostasis clip noted  EGD 02/22/2023: -Ulcerative esophagitis -Geographic duodenal ulceration previously placed clip in place -Focal area of pigmented protruberance/visible vessel-distal aspect of ulcerated duodenum.  Status post epi injection and hemostasis clip placement -Path report from prior endoscopy biopsies reviewed.  Nonspecific inflammation in the esophagus and stomach.  No obvious infection.  Small bowel histology with nothing classic for Crohn's.  Findings possibly related to excessive NSAID use.  Flexible sigmoidoscopy February 19, 2023: -Preparation of the  colon was poor -Stool in the rectum,  in the rectosigmoid colon, in the sigmoid colon.  EGD February 19, 2023: -Esophageal ulcers and esophagitis with no stigmata of recent bleeding.  Biopsy showed acute esophagitis with ulcer and ulcer slough, mild chronic gastritis.  Negative for diagnostic viral change.  Fungal stain negative. -Normal stomach, biopsy showed reactive gastropathy with focal activity with mild chronic gastritis.  H. pylori negative. -Nonobstructing oozing duodenal ulcers with oozing hemorrhage and associated clots.  Clip was placed.  Injected.  Biopsy showed acute duodenitis with ulceration and focal gastric metaplasia consistent with peptic duodenitis.  Negative for dysplasia and carcinoma.  Prior to Admission medications   Medication Sig Start Date End Date Taking? Authorizing Provider  allopurinol (ZYLOPRIM) 100 MG tablet Take 100 mg by mouth daily. 03/20/21   [provider]  atorvastatin (LIPITOR) 80 MG tablet Take 1 tablet (80 mg total) by mouth daily. Restart on 06/29/22 Patient taking differently: Take 80 mg by mouth at bedtime. Restart on 06/29/22 06/22/22   Catarina Hartshorn, MD  budesonide (ENTOCORT EC) 3 MG 24 hr capsule Take 3 capsules (9 mg total) by mouth daily. 03/10/23   Vassie Loll, MD  esomeprazole (NEXIUM) 40 MG capsule Take 1 capsule (40 mg total) by mouth 2 (two) times daily before a meal. 02/25/23 03/27/23  Shahmehdi, Gemma Payor, MD  insulin aspart (NOVOLOG) 100 UNIT/ML FlexPen Inject 2 Units into the skin 3 (three) times daily with meals. Give only if eats 50% or more of meal. Patient taking differently: Inject 2-5 Units into the skin 3 (three) times daily with meals. Give only if eats 50% or more of meal. 02/11/23   Johnson, Clanford L, MD  insulin glargine-yfgn (SEMGLEE) 100 UNIT/ML Pen Inject 5 Units into the skin daily. 03/09/23   Vassie Loll, MD  lactobacillus acidophilus & bulgar (LACTINEX) chewable tablet Chew 1 tablet by mouth 3 (three) times daily with meals for 10 days. 03/09/23 03/19/23  Vassie Loll, MD  levETIRAcetam (KEPPRA) 500 MG tablet Take 500 mg by mouth 2 (two) times daily.    [provider]  metoprolol succinate (TOPROL-XL) 25 MG 24 hr tablet Take 1.5 tablets (37.5 mg total) by mouth daily. 02/11/23   Johnson, Clanford L, MD  Nutritional Supplements (,FEEDING SUPPLEMENT, PROSOURCE PLUS) liquid Take 30 mLs by mouth 3 (three) times daily between meals. 02/25/23 03/27/23  Shahmehdi, Gemma Payor, MD  ondansetron (ZOFRAN-ODT) 4 MG disintegrating tablet Take 4 mg by mouth every 6 (six) hours as needed. 01/21/23   [provider]  sodium bicarbonate 650 MG tablet Take 1 tablet (650 mg total) by mouth 2 (two) times daily. 03/09/23   Vassie Loll, MD  venlafaxine XR (EFFEXOR-XR) 150 MG 24 hr capsule Take 150 mg by mouth daily. 07/11/22   [provider]    Current Facility-Administered Medications  Medication Dose Route Frequency Provider Last Rate Last Admin   acetaminophen (TYLENOL) tablet 650 mg  650 mg Oral Q6H PRN Adefeso, Oladapo, DO   650 mg at 03/14/23 0125   atorvastatin (LIPITOR) tablet 80 mg  80 mg Oral Daily Adefeso, Oladapo, DO       budesonide (ENTOCORT EC) 24 hr capsule 9 mg  9 mg Oral Daily Adefeso, Oladapo, DO       feeding supplement (GLUCERNA SHAKE) (GLUCERNA SHAKE) liquid 237 mL  237 mL Oral TID BM Adefeso, Oladapo, DO       insulin aspart (novoLOG) injection 0-9 Units  0-9 Units Subcutaneous Q4H Adefeso, Oladapo, DO  5 Units at 03/14/23 0434   iohexol (OMNIPAQUE) 350 MG/ML injection 75 mL  75 mL Intravenous Once PRN Adefeso, Oladapo, DO       metoprolol succinate (TOPROL-XL) 24 hr tablet 37.5 mg  37.5 mg Oral Daily Adefeso, Oladapo, DO       ondansetron (ZOFRAN) tablet 4 mg  4 mg Oral Q6H PRN Adefeso, Oladapo, DO       Or   ondansetron (ZOFRAN) injection 4 mg  4 mg Intravenous Q6H PRN Adefeso, Oladapo, DO       pantoprozole (PROTONIX) 80 mg /NS 100 mL infusion  8 mg/hr Intravenous Continuous Adefeso, Oladapo, DO       prochlorperazine  (COMPAZINE) injection 10 mg  10 mg Intravenous Q6H PRN Adefeso, Oladapo, DO        Allergies as of 03/13/2023 - Review Complete 03/13/2023  Allergen Reaction Noted   Cozaar [losartan] Nausea And Vomiting, Swelling, and Rash 02/23/2015   Quinolones Itching, Nausea And Vomiting, Nausea Only, and Swelling 05/13/2012   Cymbalta [duloxetine hcl] Itching and Swelling 01/23/2016   Sulfa antibiotics Nausea And Vomiting and Swelling 05/13/2012   Synalar [fluocinolone] Swelling 01/23/2016   Cipro [ciprofloxacin hcl] Nausea And Vomiting and Swelling 03/16/2022   Diovan [valsartan] Nausea And Vomiting and Swelling 12/22/2014   Glucophage [metformin] Other (See Comments)    Ketalar [ketamine] Swelling 11/28/2021   Norco [hydrocodone-acetaminophen] Itching and Swelling 11/28/2021   Norvasc [amlodipine besylate] Itching and Swelling 11/28/2021   Nsaids Nausea And Vomiting and Other (See Comments) 08/29/2016   Tape Itching and Rash 06/15/2018   Zestril [lisinopril] Swelling and Rash     Past Medical History:  Diagnosis Date   Acute renal failure superimposed on stage 3 chronic kidney disease (HCC) 01/21/2016   Anemia    Anxiety    B12 deficiency    Brain tumor (benign) (HCC) 10/29/2020   Cellulitis and abscess    Abdomen and buttocks   Chronic diastolic CHF (congestive heart failure) (HCC) 03/16/2022   CKD (chronic kidney disease) stage 3, GFR 30-59 ml/min (HCC)    Closed fracture of distal end of femur, unspecified fracture morphology, initial encounter (HCC)    Coronary arteriosclerosis 07/15/2018   Crohn disease (HCC)    Depression    Diabetic neuropathy (HCC)    feet   DJD (degenerative joint disease)    Right forminal stenosis C4-5   Essential hypertension    Femur fracture, right (HCC) 03/30/2017   Folliculitis    GERD (gastroesophageal reflux disease)    Gout    Headache(784.0)    Hiatal hernia    History of blood transfusion    History of bronchitis    History of cardiac  catheterization    No significant CAD 2012   History of kidney stones    surgery to remove   Insomnia    Irritable bowel syndrome    Mixed hyperlipidemia    Nausea & vomiting 12/22/2014   Osteoarthritis    Palpitations    Pneumonia    Reflux esophagitis    Salmonella    Stroke (HCC)    Tinnitus    Right   Type 2 diabetes mellitus (HCC)     Past Surgical History:  Procedure Laterality Date   BACK SURGERY     BIOPSY  02/19/2023   Procedure: BIOPSY;  Surgeon: Dolores Frame, MD;  [facility]: [facility];  Service: Gastroenterology;;   c-spine surgery     03/2007   CARDIAC CATHETERIZATION     CHOLECYSTECTOMY  COLONOSCOPY  04/21/2011; 05/29/11   6/12: [place] - ?AVM at IC valve, inflammatory changes at IC valve [facility]); 7/12 - [place]; IC valve erosions, look chronic and probably Crohn's per path   colonoscopy  2017   [place]: random biopsies normal. TI normal   COLONOSCOPY WITH PROPOFOL N/A 03/01/2023   Procedure: COLONOSCOPY WITH PROPOFOL;  Surgeon: Dolores Frame, MD;  [facility]: [facility];  Service: Gastroenterology;  Laterality: N/A;  ileocolonoscopy   ENTEROSCOPY N/A 03/06/2023   Procedure: ENTEROSCOPY;  Surgeon: Corbin Ade, MD;  [facility]: [facility];  Service: Endoscopy;  Laterality: N/A;   ESOPHAGOGASTRODUODENOSCOPY  05/29/11   normal   ESOPHAGOGASTRODUODENOSCOPY N/A 01/21/2016   Dr. Bosie Clos: normal   ESOPHAGOGASTRODUODENOSCOPY (EGD) WITH PROPOFOL N/A 02/19/2023   Procedure: ESOPHAGOGASTRODUODENOSCOPY (EGD) WITH PROPOFOL;  Surgeon: Dolores Frame, MD;  [facility];  Service: Gastroenterology;  Laterality: N/A;   ESOPHAGOGASTRODUODENOSCOPY (EGD) WITH PROPOFOL N/A 02/22/2023   Procedure: ESOPHAGOGASTRODUODENOSCOPY (EGD) WITH PROPOFOL;  Surgeon: Corbin Ade, MD;  [facility];  Service: Endoscopy;  Laterality: N/A;   ESOPHAGOGASTRODUODENOSCOPY (EGD) WITH PROPOFOL N/A 02/28/2023   Procedure:  ESOPHAGOGASTRODUODENOSCOPY (EGD) WITH PROPOFOL;  Surgeon: Corbin Ade, MD;  [facility];  Service: Endoscopy;  Laterality: N/A;   EYE SURGERY Bilateral    cataracts removed   FEMUR IM NAIL Right 04/10/2018   Procedure: INTRAMEDULLARY (IM) NAIL FEMORAL;  Surgeon: Durene Romans, MD;  Location: [facility];  Service: Orthopedics;  Laterality: Right;   FLEXIBLE SIGMOIDOSCOPY N/A 02/19/2023   Procedure: FLEXIBLE SIGMOIDOSCOPY;  Surgeon: Dolores Frame, MD;  [facility];  Service: Gastroenterology;  Laterality: N/A;   FRACTURE SURGERY     GIVENS CAPSULE STUDY  11/17/2020   GIVENS CAPSULE STUDY N/A 03/04/2023   Procedure: GIVENS CAPSULE STUDY;  Surgeon: Lanelle Bal, DO;  [facility];  Service: Endoscopy;  Laterality: N/A;   HARDWARE REMOVAL Right 04/10/2018   Procedure: HARDWARE REMOVAL RIGHT DISTAL FEMUR;  Surgeon: Durene Romans, MD;  Location: [facility];  Service: Orthopedics;  Laterality: Right;   HEMOSTASIS CLIP PLACEMENT  02/19/2023   Procedure: HEMOSTASIS CLIP PLACEMENT;  Surgeon: Dolores Frame, MD;  [facility];  Service: Gastroenterology;;   HEMOSTASIS CLIP PLACEMENT  02/22/2023   Procedure: HEMOSTASIS CLIP PLACEMENT;  Surgeon: Corbin Ade, MD;  [facility];  Service: Endoscopy;;  Resolution clip free floating, Mantis clip intact with mucosa   HEMOSTASIS CONTROL  02/19/2023   Procedure: HEMOSTASIS CONTROL;  Surgeon: Dolores Frame, MD;  [facility];  Service: Gastroenterology;;   HERNIA REPAIR     IR FLUORO GUIDE CV LINE RIGHT  04/01/2017   IR US GUIDE VASC ACCESS RIGHT  04/01/2017   JOINT REPLACEMENT Right    hip   OPEN REDUCTION INTERNAL FIXATION (ORIF) DISTAL RADIAL FRACTURE Right 05/28/2019   Procedure: OPEN REDUCTION INTERNAL FIXATION (ORIF) DISTAL RADIAL FRACTURE;  Surgeon: Ernest Mallick, MD;  Location: [facility];  Service: Orthopedics;  Laterality: Right;  with block 90 minutes    ORIF FEMUR FRACTURE Right 03/31/2017   Procedure: OPEN REDUCTION INTERNAL FIXATION (ORIF) DISTAL FEMUR FRACTURE;  Surgeon: Durene Romans, MD;  Location: [facility];  Service: Orthopedics;  Laterality: Right;   ORIF FEMUR FRACTURE Right 12/14/2022   Procedure: OPEN REDUCTION INTERNAL FIXATION (ORIF) DISTAL FEMUR FRACTURE;  Surgeon: Roby Lofts, MD;  Location: [facility];  Service: Orthopedics;  Laterality: Right;   removal of kidney  stone     Right knee arthroscopic surgery     SCLEROTHERAPY  02/22/2023   Procedure: SCLEROTHERAPY;  Surgeon: Corbin Ade, MD;  [facility];  Service: Endoscopy;;   TONSILLECTOMY     TOTAL ABDOMINAL HYSTERECTOMY      Family History  Problem Relation Age of Onset   Diabetes Mother    Colon cancer Brother        POSSIBLY. Patient states diagnosed at age 80, then later states age 32. unclear and limited historian   Esophageal cancer Neg Hx    Rectal cancer Neg Hx    Stomach cancer Neg Hx     Social History   Socioeconomic History   Marital status: Married    Spouse name: Not on file   Number of children: 2   Years of education: Not on file   Highest education level: Not on file  Occupational History   Occupation: Disabled  Tobacco Use   Smoking status: Never   Smokeless tobacco: Never  Vaping Use   Vaping Use: Never used  Substance and Sexual Activity   Alcohol use: No   Drug use: No   Sexual activity: Not on file    Comment: Hysterectomy  Other Topics Concern   Not on file  Social History Narrative   Patient is married 2 children she is disabled, 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    Social Determinants of Health   Financial Resource Strain: Not on file  Food Insecurity: No Food Insecurity (03/14/2023)   Hunger Vital Sign    Worried About Running Out of  Food in the Last Year: Never true    Ran Out of Food in the Last Year: Never true  Transportation Needs: No Transportation Needs (03/14/2023)   PRAPARE - Administrator, Civil Service (Medical): No    Lack of Transportation (Non-Medical): No  Physical Activity: Not on file  Stress: Not on file  Social Connections: Not on file  Intimate Partner Violence: Not At Risk (03/14/2023)   Humiliation, Afraid, Rape, and Kick questionnaire    Fear of Current or Ex-Partner: No    Emotionally Abused: No    Physically Abused: No    Sexually Abused: No     Review of System:   General: Negative for anorexia, weight loss, fever, chills, fatigue, +weakness. Eyes: Negative for vision changes.  ENT: Negative for hoarseness, difficulty swallowing , nasal congestion. CV: Negative for chest pain, angina, palpitations, dyspnea on exertion, +peripheral edema. See hpi, chest pain with meals Respiratory: Negative for dyspnea at rest, dyspnea on exertion, cough, sputum, wheezing.  GI: See history of present illness. GU:  Negative for dysuria, hematuria, urinary incontinence, urinary frequency, nocturnal urination.  MS: Negative for joint pain, low back pain.  Derm: Negative for rash or itching.  Neuro: Negative for weakness, abnormal sensation, seizure, frequent headaches, memory loss, confusion.  Psych: Negative for anxiety, depression, suicidal ideation, hallucinations.  Endo: Negative for unusual weight change.  Heme: Negative for bruising or bleeding. Allergy: Negative for rash or hives.      Physical Examination:   Vital signs in last 24 hours: Temp:  [98 F (36.7 C)-98.6 F (37 C)] 98.4 F (36.9 C) (05/16 0338) Pulse Rate:  [  79-106] 82 (05/16 0338) Resp:  [12-21] 18 (05/16 0338) BP: (113-146)/(41-92) 131/72 (05/16 0338) SpO2:  [94 %-100 %] 94 % (05/16 0338) Weight:  [54.4 kg] 54.4 kg (05/15 1650) Last BM Date : 03/13/23  General: chronically ill appearing female, in no acute  distress.  Head: Normocephalic, atraumatic.   Eyes: Conjunctiva pink, no icterus. Mouth: Oropharyngeal mucosa moist and pink , no lesions erythema or exudate. Neck: Supple without thyromegaly, masses, or lymphadenopathy.  Lungs: Clear to auscultation bilaterally.  Heart: Regular rate and rhythm, no murmurs rubs or gallops.  Abdomen: Bowel sounds are normal, nontender, nondistended, no hepatosplenomegaly or masses, no abdominal bruits or hernia , no rebound or guarding.   Rectal: not performed Extremities: 2+ lower extremity edema, clubbing, deformity. 3+upper extremities Neuro: Alert and oriented x 4 , grossly normal neurologically.  Skin: Warm and dry, no rash or jaundice.   Psych: Alert and cooperative, normal mood and affect.        Intake/Output from previous day: 05/15 0701 - 05/16 0700 In: 974.6 [P.O.:480; Blood:444.6; IV Piggyback:50] Out: -  Intake/Output this shift: No intake/output data recorded.  Lab Results:   CBC Recent Labs    03/13/23 1756 03/14/23 0445  WBC 12.3* 7.8  HGB 6.3* 7.0*  HCT 20.1* 21.2*  MCV 100.5* 94.2  PLT 272 146*   BMET Recent Labs    03/13/23 1756 03/14/23 0416  NA 132* 132*  K 4.9 4.7  CL 105 107  CO2 20* 18*  GLUCOSE 311* 291*  BUN 73* 75*  CREATININE 1.72* 1.74*  CALCIUM 7.8* 7.7*   LFT Recent Labs    03/13/23 1756 03/14/23 0416  BILITOT 0.6 0.6  ALKPHOS 79 69  AST 33 25  ALT 22 19  PROT 4.5* 4.1*  ALBUMIN 1.9* 1.8*    Lipase No results for input(s): "LIPASE" in the last 72 hours.  PT/INR No results for input(s): "LABPROT", "INR" in the last 72 hours.   Hepatitis Panel No results for input(s): "HEPBSAG", "HCVAB", "HEPAIGM", "HEPBIGM" in the last 72 hours.   Imaging Studies:   [place] EKG SITE RITE  Result Date: 03/14/2023 If Site Rite image not attached, placement could not be confirmed due to current cardiac rhythm.  [place] MESENTERIC ARTERIES  Result Date: 03/07/2023 CLINICAL DATA:  Anemia Rectal bleeding  Hypertension Stroke Diabetes Hyperlipidemia EXAM: [place] MESENTERIC ARTERIAL DOPPLER COMPARISON:  CT abdomen pelvis without contrast 02/17/2023 FINDINGS: Celiac axis: 99 cm/sec Celiac axis with inspiration: 102 cm/sec Celiac axis with expiration: 97 cm/sec Splenic artery: 93 cm/sec Hepatic artery: 110 cm/sec SMA: 242 cm/sec IMA: 112 cm/sec Aorta: 145 cm/sec Aortic size: 1.8 cm proximally, 1.4 cm in the mid segment and 1.2 cm distally IMPRESSION: 1. No hemodynamically significant stenosis of the celiac, superior mesenteric, or inferior mesenteric arteries. 2. Atherosclerotic calcifications seen throughout the abdominal aorta. Electronically Signed   By: Acquanetta Belling M.D.   On: 03/07/2023 15:25   DG Abd 1 View  Result Date: 03/06/2023 CLINICAL DATA:  Retained foreign body. EXAM: ABDOMEN - 1 VIEW COMPARISON:  03/05/2023. FINDINGS: The bowel gas pattern is normal. A moderate amount of retained stool is present in the colon. Cholecystectomy clips are noted in the right upper quadrant. A 1.1 cm radiopaque density projects over the right lower quadrant which was seen on the prior exam. Extensive vascular calcifications are noted in the left upper quadrant, pelvis, and bilateral lower extremities. Fixation hardware is noted in the proximal right femur. Lumbar spinal fusion hardware is noted. IMPRESSION: 1.  Radiopaque density in the right lower quadrant measuring 1.1 cm, unchanged from the prior exam, possible endoscopy capsule. 2. Nonobstructive bowel-gas pattern. 3. Moderate amount of retained stool in the colon. Electronically Signed   By: Thornell Sartorius M.D.   On: 03/06/2023 04:55   DG Abd 1 View  Result Date: 03/05/2023 CLINICAL DATA:  Retained foreign body. Retained capsule from yesterday. EXAM: ABDOMEN - 1 VIEW COMPARISON:  CT abdomen/pelvis 02/17/2023. FINDINGS: No dilated loops of small bowel are demonstrated to suggest small bowel obstruction. Moderate colonic stool burden. A 1.7 cm dense object projects in the  region of the right lower quadrant of the abdomen/right hemipelvis (see annotations on images). Surgical clips within the right upper quadrant of the abdomen. Lumbar posterior spinal fusion construct. Prior ORIF of the right femur, incompletely imaged.No acute osseous abnormality identified. IMPRESSION: 1. 1.7 cm dense object projecting in the region of the right lower quadrant of the abdomen/right hemipelvis, likely reflecting the reported endoscopy capsule. 2. Nonobstructive bowel gas pattern. 3. Moderate colonic stool burden. Electronically Signed   By: Jackey Loge D.O.   On: 03/05/2023 14:05   [place] EKG SITE RITE  Result Date: 02/27/2023 If Site Rite image not attached, placement could not be confirmed due to current cardiac rhythm.  CT ABDOMEN PELVIS WO CONTRAST  Result Date: 02/17/2023 CLINICAL DATA:  Concern for bowel obstruction. Patient reportedly has red stool. EXAM: CT ABDOMEN AND PELVIS WITHOUT CONTRAST TECHNIQUE: Multidetector CT imaging of the abdomen and pelvis was performed following the standard protocol without IV contrast. RADIATION DOSE REDUCTION: This exam was performed according to the departmental dose-optimization program which includes automated exposure control, adjustment of the mA and/or kV according to patient size and/or use of iterative reconstruction technique. COMPARISON:  CT abdomen pelvis dated 02/04/2023. FINDINGS: Lower chest: Moderate bilateral pleural effusions with associated atelectasis are partially imaged. The heart is mildly enlarged. The blood pool is hypoattenuating, suggestive of anemia. Hepatobiliary: No focal liver abnormality is seen. Status post cholecystectomy. No biliary dilatation. Pancreas: Atrophic. A 1.8 cm low-density lesion in the pancreatic tail appears unchanged. No pancreatic ductal dilatation or surrounding inflammatory changes. Spleen: Normal in size without focal abnormality. Adrenals/Urinary Tract: Adrenal glands are unremarkable. Bilateral  punctate calcifications in both kidneys may represent nonobstructing calculi measuring up to 2 mm and/or vascular calcifications. No suspicious focal lesion or hydronephrosis on either side. Bladder is unremarkable. Stomach/Bowel: Stomach is within normal limits. The appendix is not definitely identified, however no pericecal inflammatory changes are noted to suggest acute appendicitis. There is a mild bowel wall thickening and mucosal enhancement involving portions of the sigmoid colon and rectum. No significant surrounding inflammatory changes. No bowel obstruction. Vascular/Lymphatic: Aortic atherosclerosis. No enlarged abdominal or pelvic lymph nodes. Reproductive: Status post hysterectomy. No adnexal masses. Other: No abdominal wall hernia or abnormality. No abdominopelvic ascites. Anasarca is noted Musculoskeletal: There has been slight progression since 02/04/2023 of a T12 compression deformity with approximately 50% height loss centrally and 3 mm retropulsion into the central canal. Fixation hardware is seen in the right femur. Fixation hardware in the lumbar spine from L1-L4. IMPRESSION: 1. Mild bowel wall thickening and mucosal enhancement involving portions of the sigmoid colon and rectum may reflect proctocolitis. No bowel obstruction. 2. Moderate bilateral pleural effusions with associated atelectasis. 3. Slight progression since 02/04/2023 of a T12 compression deformity with approximately 50% height loss centrally and 3 mm retropulsion into the central canal. 4. Low-density pancreatic lesion. When the patient is clinically stable and able to  follow directions and hold their breath (preferably as an outpatient) further evaluation with dedicated abdominal MRI could be considered for further characterization. Aortic Atherosclerosis (ICD10-I70.0). Electronically Signed   By: Romona Curls M.D.   On: 02/17/2023 17:36  [4 week]  Assessment:   72 year old female with history of small bowel Crohn's  (suspected for years but no objective findings to support it) and started treatment in early 2022 after small bowel capsule demonstrated numerous aphthous ulcers in the small bowel. Previously on Humira but discontinued at some point between 2022/2023. She also has history of pancreatitis possibly related to hypertriglyceridemia and use of Victoza and ozempic, chronic kidney disease, depression, diabetes, GERD, IBS, anxiety, hyperlipidemia, stroke with multiple recent admissions for GI bleeding/acute on chronic anemia.  Recently discharged on May 11.  Recurrent GI bleeding: per nursing, stools brown with red blood. Patient reports her caregiver saw melena. Presenting back with drop in Hgb since May 11th from 8 to 6. She has had extensive evaluation since April for recurrent bleeding.  Initially found to have esophageal ulcers and duodenal ulcers with active bleeding February 19, 2023 in the setting naprosyn.  Repeat endoscopy May 2 showed improvement in esophagitis and duodenal ulcer disease.  Colonoscopy May 3 with normal TI and unremarkable colon.  Small bowel capsule study May 7 showing ulcerated and erythematous small bowel most severe in the proximal small bowel.  Scattered erosions and/or small AVMs noted.  Small bowel enteroscopy completed 8 with improvement in esophagitis, duodenal ulceration.  Small bowel to be erosions and friability extending down to the proximal jejunum without identifiable treatable lesion.   Clinical presentation not felt to be typical of routine peptic ulcer disease therefore findings suspected to be secondary to Crohn's or possibly vascular ischemia.  Noncontrast abdominal CT felt to have diminutive plaque of the IMA, marked plaquing of the SMA, diminutive appearing celiac artery.  Mesenteric Dopplers completed May 9 without any hemodynamically significant stenosis of the celiac, SMA, IMA.  Formal CTA could not be done due to GFR.  She was started on Entocort last admission for  suspected Crohn's enteritis.  Per review of prior notes by her primary GI, Dr. [name] Payor, patient has failed budesonide in the past and she developed "skyhigh" sugars on IV solumedrol.    Plan:   Abdomen 1 view to reassess for retained capsule (completed today and capsule has passed). CTA Abd/pelvis not possible due to GFR. NM GI bleeding scan advised, currently IV access is not available. Consider restarting Humira. Continue budesonide for now.    LOS: 0 days   We would like to thank you for the opportunity to participate in the care of Heather [name].  Leanna Battles. [facility] [phone] 5/16/20247:36 AM

## 2023-03-14 NOTE — Progress Notes (Signed)
Pt IV site beginning to bruise. This RN called pharmacy regarding infiltration with blood administration. Per pharmacist [name], elevate arm and apply heat pack. Arm elevated and heat pack applied. Heather [name]

## 2023-03-14 NOTE — Consult Note (Addendum)
Consultation Note Date: 03/14/2023   Patient Name: Heather [name]  DOB: Nov 01, 1950  MRN: [id number]  Age / Sex: 72 y.o., female  PCP: [name], MD Referring Physician: Shon Hale, MD  Reason for Consultation: Establishing goals of care  HPI/Patient Profile: 72 y.o. female  with past medical history of 7 hospital visits in the last 6 months with 3 visits in the last few months for GI bleed, small bowel Crohn's disease diagnosed January 2022 previously on Humira, acute on chronic anemia/GI bleeding in the setting of ulcerative esophagitis, duodenal ulcer, small bowel erosions/friability with repeated hospitalization most recently discharged May 11 with extensive endoscopic evaluations, IBS, HTN, DM2, diastolic heart failure, CKD 4, history of stroke, GI bleed, GERD, anemia, B12 deficiency, cellulitis and abscess of abdomen and buttocks, coronary artery sclerosis, DJD C4-5 foraminal stenosis, OA, admitted on 03/13/2023 with GI bleed, acute blood loss anemia.   Clinical Assessment and Goals of Care: I have reviewed medical records including EPIC notes, labs and imaging, received report from RN, assessed the patient.  Heather [name] is lying quietly in bed.  She appears acutely/chronically ill and frail, older than stated age.  She is alert and oriented x 3, able to make her needs known.  There is no family at bedside at this time.  Bedside nursing staff is present attending to needs.  As we are visiting GI PA arrives.  We meeet at the bedside to discuss diagnosis prognosis, GOC, EOL wishes, disposition and options. I introduced Palliative Medicine as specialized medical care for people living with serious illness. It focuses on providing relief from the symptoms and stress of a serious illness. The goal is to improve quality of life for both the patient and the family.  We discussed a brief life review of the  patient.  Heather [name] has been married to her husband [name] for 51 years.  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  Per [facility] staff she had a son who died in 03/25/2000.  They shared that her husband endorses depression and low motivation since.  She tells me that she is in essence bedbound with care being provided by her husband and her daughter-in-law.   We then focused on their current illness.  We talk at length about her GI issues and consult/recommendations from GI service.  Heather [name] continues to state that she would not want further EGD/colonoscopy.  At this point the goal is for a nuclear medicine bleeding scan which requires IV access.  Heather [name] will be followed closely with GI service.  We talk in detail about her anasarca.  We talk about her low albumin.  We talk about her nutritional status.   We talk about attempt for IV access.  I shared that until she has IV access there is no way to move forward with the treatment plan or further testing.  Bedside nursing staff have tried ultrasound-guided placement to no avail.  Vascular team has been requested for attempt of  PICC which has been placed 5/16 in the afternoon.  The natural disease trajectory and expectations at EOL were discussed.  Advanced directives, concepts specific to code status, artifical feeding and hydration, and rehospitalization were considered and discussed.  We talked about the concept of "treat the treatable, but allow a natural passing".  I shared my concern that if she is not willing to accept further EGD/colonoscopy, and we cannot change what is happening, chest compressions and intubation will not change things either.  Heather [name] states that she would want attempted resuscitation.  I ask how long she would accept on life support.  She tells me that she has not considered this.  I encouraged her to do so and speak with her family.  Discussed the importance of continued  conversation with family and the medical providers regarding overall plan of care and treatment options, ensuring decisions are within the context of the patient's values and GOCs. Questions and concerns were addressed. The patient and family were encouraged to call with questions or concerns.  PMT will continue to support holistically.  Call to husband/HCPOA, [name].  No answer, unable to leave voicemail message as mailbox is full.   Conference with attending, GI service, bedside nursing staff, transition of care team related to patient condition, needs, goals of care, disposition. TOC team shares that Heather [name] did have short-term rehab at [facility] in the past, but declined short-term rehab bed offers in February.  They share that since she has not had 60 days without hospital visit she would still be in her co-pay days if she elects short-term rehab.  ADDENDUM:  Return call from husband.  We plan for family meeting Monday 5/20 at 9 am.  [name] tells me that they have discussed code status and they would want attempted resuscitation, but no long-term life support.  We talk about Heather [name] acute and chronic health concerns and her declines.  [name] tells me "it doesn't look like she's going to be here too long".  He tells me, "I can't get her to take food". We talk about her lack of IV access until this afternoon, need for further testing and anticipated further blood administration.  We talk about time for outcomes.  Family meeting planned.   HCPOA  NEXT OF KIN -Heather [name] names her husband of 51 years, [name] as her healthcare surrogate.    SUMMARY OF RECOMMENDATIONS   At this point continue full scope/full code. Time for outcomes.   Code Status/Advance Care Planning: Full code - We talked about the concept of "treat the treatable, but allow a natural passing".  I shared my concern that if she is not willing to accept further EGD/colonoscopy, and we  cannot change what is happening, chest compressions and intubation will not change things either.  Heather [name] states that she would want attempted resuscitation.  I ask how long she would accept on life support.  She tells me that she has not considered this.  I encouraged her to do so and speak with her family.  Symptom Management:  Per hospitalist/GI, no additional needs at this time.  Palliative Prophylaxis:  Palliative Wound Care and Turn Reposition  Additional Recommendations (Limitations, Scope, Preferences): Full Scope Treatment  Psycho-social/Spiritual:  Desire for further Chaplaincy support:no Additional Recommendations: Caregiving  Support/Resources  Prognosis:  Unable to determine, based on outcomes.  Guarded at this point.  3 months or less would not be surprising based on decreased functional status, chronic illness burden.  Discharge Planning: To be determined, based on outcomes and qualifications.      Primary Diagnoses: Present on Admission:  GI bleed  ABLA (acute blood loss anemia)  Type 2 diabetes mellitus with hyperglycemia (HCC)  Hypoalbuminemia due to protein-calorie malnutrition (HCC)  Chronic diastolic CHF (congestive heart failure) (HCC)  ? Crohn's disease  Mixed hyperlipidemia   I have reviewed the medical record, interviewed the patient and family, and examined the patient. The following aspects are pertinent.  Past Medical History:  Diagnosis Date   Acute renal failure superimposed on stage 3 chronic kidney disease (HCC) 01/21/2016   Anemia    Anxiety    B12 deficiency    Brain tumor (benign) (HCC) 10/29/2020   Cellulitis and abscess    Abdomen and buttocks   Chronic diastolic CHF (congestive heart failure) (HCC) 03/16/2022   CKD (chronic kidney disease) stage 3, GFR 30-59 ml/min (HCC)    Closed fracture of distal end of femur, unspecified fracture morphology, initial encounter (HCC)    Coronary arteriosclerosis 07/15/2018   Crohn disease  (HCC)    Depression    Diabetic neuropathy (HCC)    feet   DJD (degenerative joint disease)    Right forminal stenosis C4-5   Essential hypertension    Femur fracture, right (HCC) 03/30/2017   Folliculitis    GERD (gastroesophageal reflux disease)    Gout    Headache(784.0)    Hiatal hernia    History of blood transfusion    History of bronchitis    History of cardiac catheterization    No significant CAD 2012   History of kidney stones    surgery to remove   Insomnia    Irritable bowel syndrome    Mixed hyperlipidemia    Nausea & vomiting 12/22/2014   Osteoarthritis    Palpitations    Pneumonia    Reflux esophagitis    Salmonella    Stroke (HCC)    Tinnitus    Right   Type 2 diabetes mellitus (HCC)    Social History   Socioeconomic History   Marital status: Married    Spouse name: Not on file   Number of children: 2   Years of education: Not on file   Highest education level: Not on file  Occupational History   Occupation: Disabled  Tobacco Use   Smoking status: Never   Smokeless tobacco: Never  Vaping Use   Vaping Use: Never used  Substance and Sexual Activity   Alcohol use: No   Drug use: No   Sexual activity: Not on file    Comment: Hysterectomy  Other Topics Concern   Not on file  Social History Narrative   Patient is married 2 children she is disabled, 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    Social Determinants of Health   Financial Resource Strain: Not on file  Food Insecurity: No Food Insecurity (03/14/2023)   Hunger Vital Sign    Worried About Running Out of Food in the Last Year: Never true    Ran Out of Food in the Last Year: Never true  Transportation Needs: No Transportation Needs (03/14/2023)   PRAPARE - Administrator, Civil Service (Medical): No     Lack of Transportation (Non-Medical):  No  Physical Activity: Not on file  Stress: Not on file  Social Connections: Not on file   Family History  Problem Relation Age of Onset   Diabetes Mother    Colon cancer Brother        POSSIBLY. Patient states diagnosed at age 89, then later states age 6. unclear and limited historian   Esophageal cancer Neg Hx    Rectal cancer Neg Hx    Stomach cancer Neg Hx    Scheduled Meds:  atorvastatin  80 mg Oral Daily   budesonide  9 mg Oral Daily   feeding supplement (GLUCERNA SHAKE)  237 mL Oral TID BM   insulin aspart  0-9 Units Subcutaneous Q4H   metoprolol succinate  37.5 mg Oral Daily   Continuous Infusions:  magnesium sulfate bolus IVPB     pantoprazole     PRN Meds:.acetaminophen, iohexol, ondansetron **OR** ondansetron (ZOFRAN) IV, prochlorperazine Medications Prior to Admission:  Prior to Admission medications   Medication Sig Start Date End Date Taking? Authorizing Provider  allopurinol (ZYLOPRIM) 100 MG tablet Take 100 mg by mouth daily. 03/20/21   [provider]  atorvastatin (LIPITOR) 80 MG tablet Take 1 tablet (80 mg total) by mouth daily. Restart on 06/29/22 Patient taking differently: Take 80 mg by mouth at bedtime. Restart on 06/29/22 06/22/22   Catarina Hartshorn, MD  budesonide (ENTOCORT EC) 3 MG 24 hr capsule Take 3 capsules (9 mg total) by mouth daily. 03/10/23   Vassie Loll, MD  esomeprazole (NEXIUM) 40 MG capsule Take 1 capsule (40 mg total) by mouth 2 (two) times daily before a meal. 02/25/23 03/27/23  Shahmehdi, Gemma Payor, MD  insulin aspart (NOVOLOG) 100 UNIT/ML FlexPen Inject 2 Units into the skin 3 (three) times daily with meals. Give only if eats 50% or more of meal. Patient taking differently: Inject 2-5 Units into the skin 3 (three) times daily with meals. Give only if eats 50% or more of meal. 02/11/23   Johnson, Clanford L, MD  insulin glargine-yfgn (SEMGLEE) 100 UNIT/ML Pen Inject 5 Units into the skin daily. 03/09/23    Vassie Loll, MD  lactobacillus acidophilus & bulgar (LACTINEX) chewable tablet Chew 1 tablet by mouth 3 (three) times daily with meals for 10 days. 03/09/23 03/19/23  Vassie Loll, MD  levETIRAcetam (KEPPRA) 500 MG tablet Take 500 mg by mouth 2 (two) times daily.    [provider]  metoprolol succinate (TOPROL-XL) 25 MG 24 hr tablet Take 1.5 tablets (37.5 mg total) by mouth daily. 02/11/23   Johnson, Clanford L, MD  Nutritional Supplements (,FEEDING SUPPLEMENT, PROSOURCE PLUS) liquid Take 30 mLs by mouth 3 (three) times daily between meals. 02/25/23 03/27/23  Shahmehdi, Gemma Payor, MD  ondansetron (ZOFRAN-ODT) 4 MG disintegrating tablet Take 4 mg by mouth every 6 (six) hours as needed. 01/21/23   [provider]  sodium bicarbonate 650 MG tablet Take 1 tablet (650 mg total) by mouth 2 (two) times daily. 03/09/23   Vassie Loll, MD  venlafaxine XR (EFFEXOR-XR) 150 MG 24 hr capsule Take 150 mg by mouth daily. 07/11/22   [provider]   Allergies  Allergen Reactions   Cozaar [Losartan] Nausea And Vomiting, Swelling and Rash   Quinolones Itching, Nausea And Vomiting, Nausea Only and Swelling    Swelling of face, jaw, and lips    Cymbalta [Duloxetine Hcl] Itching and Swelling   Sulfa Antibiotics Nausea And Vomiting and Swelling    Headache  Swelling of feet, legs  Synalar [Fluocinolone] Swelling   Cipro [Ciprofloxacin Hcl] Nausea And Vomiting and Swelling    Swelling of face, jaw, lips   Diovan [Valsartan] Nausea And Vomiting and Swelling   Glucophage [Metformin] Other (See Comments)    GI upset   Ketalar [Ketamine] Swelling   Norco [Hydrocodone-Acetaminophen] Itching and Swelling   Norvasc [Amlodipine Besylate] Itching and Swelling    Fluid retention    Nsaids Nausea And Vomiting and Other (See Comments)    Has bleeding ulcers   Tape Itching and Rash    Paper tape can only be used on this patient   Zestril [Lisinopril] Swelling and Rash    Oral swelling Red  streaks on arms/legs   Review of Systems  Unable to perform ROS: Acuity of condition    Physical Exam Vitals and nursing note reviewed.  Constitutional:      General: She is not in acute distress.    Appearance: She is ill-appearing.  Cardiovascular:     Rate and Rhythm: Normal rate.  Pulmonary:     Effort: Pulmonary effort is normal. No respiratory distress.  Abdominal:     Palpations: Abdomen is soft.  Musculoskeletal:        General: Swelling present.     Comments: Anasarca  Skin:    General: Skin is warm and dry.  Neurological:     Mental Status: She is alert and oriented to person, place, and time.     Vital Signs: BP 131/72   Pulse 82   Temp 98.4 F (36.9 C)   Resp 18   Ht 5\' 4"  (1.626 m)   Wt 54.4 kg   LMP  (LMP Unknown)   SpO2 94%   BMI 20.60 kg/m  Pain Scale: 0-10   Pain Score: Asleep   SpO2: SpO2: 94 % O2 Device:SpO2: 94 % O2 Flow Rate: .   IO: Intake/output summary:  Intake/Output Summary (Last 24 hours) at 03/14/2023 0855 Last data filed at 03/14/2023 0500 Gross per 24 hour  Intake 974.58 ml  Output --  Net 974.58 ml    LBM: Last BM Date : 03/13/23 Baseline Weight: Weight: 54.4 kg Most recent weight: Weight: 54.4 kg     Palliative Assessment/Data:     Time In: 0830 Time Out: 0945 Time Total: 75 minutes  Greater than 50%  of this time was spent counseling and coordinating care related to the above assessment and plan.  Signed by: Katheran Awe, NP   Please contact Palliative Medicine Team phone at [phone] for questions and concerns.  For individual provider: See Loretha Stapler

## 2023-03-14 NOTE — Progress Notes (Signed)
  Transition of Care [facility]) Screening Note   Patient Details  Name: Heather [name] Date of Birth: 07-22-1951   Transition of Care [facility]) CM/SW Contact:    Annice Needy, LCSW Phone Number: 03/14/2023, 12:31 PM    Transition of Care Department [facility]) has reviewed patient and no TOC needs have been identified at this time. We will continue to monitor patient advancement through interdisciplinary progression rounds. If new patient transition needs arise, please place a TOC consult.

## 2023-03-14 NOTE — Progress Notes (Signed)
Hypoglycemic Event  CBG: 68  Treatment: 4 oz juice/soda  Symptoms: Hungry  Follow-up CBG: Time:1156 CBG Result:60  Possible Reasons for Event: Inadequate meal intake  Comments/MD notified: [name], MD.    Diego Cory, RN  **Pt to eat lunch and will re-check sugar**

## 2023-03-14 NOTE — Progress Notes (Signed)
L Upper Arm IV has infiltrated. Pt reports pain in left arm. Arm is edematous. No bruising noted. Unsuccessful attempts at new IV establishment including with Ultrasound. Dr Thomes Dinning was informed by primary nurse, Wyn Forster, LPN at 0960. Pt has  required central line placement in the past. Heather [name]

## 2023-03-14 NOTE — Progress Notes (Signed)
This RN spoke with Dr. Thomes Dinning regarding IV access. He advises hemoglobin recheck and await for AM for possible PICC placement. Lab was called for lab draw. Heather [name]

## 2023-03-14 NOTE — Progress Notes (Signed)
Pt's IV infiltrated. IV removed. L arm elevated and and heat packs applied. Pt unable to get remaining blood, CT scan, or Protonix. MD made aware of this. HGB this a.m. 7.0. SBP elevated, other vitals stable.

## 2023-03-14 NOTE — Progress Notes (Signed)
Report given to [facility], LPN. Pt to be transported to room 301 via bed.

## 2023-03-14 NOTE — Telephone Encounter (Signed)
LMOM for pt to call office  

## 2023-03-14 NOTE — Progress Notes (Signed)
Patient came to floor alert and receiving first unit of blood. Patient is pale in appearance and has severe edema ( 4plus) to right upper extremity.  Patient has edema also to left arm, which small area of weeping near antecubital. Patient also has 2 small stage 2 to sacrum.

## 2023-03-14 NOTE — Progress Notes (Signed)
Hypoglycemic Event  CBG: 51  Treatment: D50 50 mL (25 gm)  Symptoms: Hungry  Follow-up CBG: Time:1616 CBG Result:79  Possible Reasons for Event: Inadequate meal intake  Comments/MD notified:[name], MD.    Heather [name] Heather [name]  **Pt was away at testing, just now able to obtain blood sugar again.**

## 2023-03-14 NOTE — Progress Notes (Signed)
Pt C/O sharp, shooting chest pain on arrival to the unit. STAT EKG performed and results showed NSR with a rate of 80. Placed in chart, will continue to monitor.

## 2023-03-15 DIAGNOSIS — E1122 Type 2 diabetes mellitus with diabetic chronic kidney disease: Secondary | ICD-10-CM | POA: Diagnosis present

## 2023-03-15 DIAGNOSIS — K264 Chronic or unspecified duodenal ulcer with hemorrhage: Secondary | ICD-10-CM | POA: Diagnosis not present

## 2023-03-15 DIAGNOSIS — E8809 Other disorders of plasma-protein metabolism, not elsewhere classified: Secondary | ICD-10-CM | POA: Diagnosis present

## 2023-03-15 DIAGNOSIS — Z885 Allergy status to narcotic agent status: Secondary | ICD-10-CM | POA: Diagnosis not present

## 2023-03-15 DIAGNOSIS — I130 Hypertensive heart and chronic kidney disease with heart failure and stage 1 through stage 4 chronic kidney disease, or unspecified chronic kidney disease: Secondary | ICD-10-CM | POA: Diagnosis present

## 2023-03-15 DIAGNOSIS — K50011 Crohn's disease of small intestine with rectal bleeding: Secondary | ICD-10-CM

## 2023-03-15 DIAGNOSIS — K5000 Crohn's disease of small intestine without complications: Secondary | ICD-10-CM | POA: Diagnosis present

## 2023-03-15 DIAGNOSIS — K922 Gastrointestinal hemorrhage, unspecified: Secondary | ICD-10-CM | POA: Diagnosis not present

## 2023-03-15 DIAGNOSIS — E782 Mixed hyperlipidemia: Secondary | ICD-10-CM | POA: Diagnosis present

## 2023-03-15 DIAGNOSIS — E1165 Type 2 diabetes mellitus with hyperglycemia: Secondary | ICD-10-CM | POA: Diagnosis present

## 2023-03-15 DIAGNOSIS — N184 Chronic kidney disease, stage 4 (severe): Secondary | ICD-10-CM | POA: Diagnosis present

## 2023-03-15 DIAGNOSIS — E43 Unspecified severe protein-calorie malnutrition: Secondary | ICD-10-CM | POA: Diagnosis present

## 2023-03-15 DIAGNOSIS — E876 Hypokalemia: Secondary | ICD-10-CM | POA: Diagnosis present

## 2023-03-15 DIAGNOSIS — K921 Melena: Secondary | ICD-10-CM | POA: Diagnosis present

## 2023-03-15 DIAGNOSIS — I5032 Chronic diastolic (congestive) heart failure: Secondary | ICD-10-CM | POA: Diagnosis present

## 2023-03-15 DIAGNOSIS — Z882 Allergy status to sulfonamides status: Secondary | ICD-10-CM | POA: Diagnosis not present

## 2023-03-15 DIAGNOSIS — K254 Chronic or unspecified gastric ulcer with hemorrhage: Secondary | ICD-10-CM

## 2023-03-15 DIAGNOSIS — I2510 Atherosclerotic heart disease of native coronary artery without angina pectoris: Secondary | ICD-10-CM | POA: Diagnosis present

## 2023-03-15 DIAGNOSIS — Z881 Allergy status to other antibiotic agents status: Secondary | ICD-10-CM | POA: Diagnosis not present

## 2023-03-15 DIAGNOSIS — E871 Hypo-osmolality and hyponatremia: Secondary | ICD-10-CM | POA: Diagnosis not present

## 2023-03-15 DIAGNOSIS — Z7189 Other specified counseling: Secondary | ICD-10-CM | POA: Diagnosis not present

## 2023-03-15 DIAGNOSIS — Z794 Long term (current) use of insulin: Secondary | ICD-10-CM | POA: Diagnosis not present

## 2023-03-15 DIAGNOSIS — D519 Vitamin B12 deficiency anemia, unspecified: Secondary | ICD-10-CM | POA: Diagnosis present

## 2023-03-15 DIAGNOSIS — F32A Depression, unspecified: Secondary | ICD-10-CM | POA: Diagnosis present

## 2023-03-15 DIAGNOSIS — Z79899 Other long term (current) drug therapy: Secondary | ICD-10-CM | POA: Diagnosis not present

## 2023-03-15 DIAGNOSIS — Z515 Encounter for palliative care: Secondary | ICD-10-CM | POA: Diagnosis not present

## 2023-03-15 DIAGNOSIS — D62 Acute posthemorrhagic anemia: Secondary | ICD-10-CM | POA: Diagnosis present

## 2023-03-15 DIAGNOSIS — K2971 Gastritis, unspecified, with bleeding: Secondary | ICD-10-CM | POA: Diagnosis not present

## 2023-03-15 DIAGNOSIS — Z66 Do not resuscitate: Secondary | ICD-10-CM | POA: Diagnosis present

## 2023-03-15 DIAGNOSIS — E1140 Type 2 diabetes mellitus with diabetic neuropathy, unspecified: Secondary | ICD-10-CM | POA: Diagnosis present

## 2023-03-15 DIAGNOSIS — Z886 Allergy status to analgesic agent status: Secondary | ICD-10-CM | POA: Diagnosis not present

## 2023-03-15 LAB — CBC
HCT: 22.9 % — ABNORMAL LOW (ref 36.0–46.0)
Hemoglobin: 7.4 g/dL — ABNORMAL LOW (ref 12.0–15.0)
MCH: 30.8 pg (ref 26.0–34.0)
MCHC: 32.3 g/dL (ref 30.0–36.0)
MCV: 95.4 fL (ref 80.0–100.0)
Platelets: 164 10*3/uL (ref 150–400)
RBC: 2.4 MIL/uL — ABNORMAL LOW (ref 3.87–5.11)
RDW: 16.4 % — ABNORMAL HIGH (ref 11.5–15.5)
WBC: 9.8 10*3/uL (ref 4.0–10.5)
nRBC: 0 % (ref 0.0–0.2)

## 2023-03-15 LAB — GLUCOSE, CAPILLARY
Glucose-Capillary: 140 mg/dL — ABNORMAL HIGH (ref 70–99)
Glucose-Capillary: 193 mg/dL — ABNORMAL HIGH (ref 70–99)
Glucose-Capillary: 268 mg/dL — ABNORMAL HIGH (ref 70–99)
Glucose-Capillary: 341 mg/dL — ABNORMAL HIGH (ref 70–99)
Glucose-Capillary: 344 mg/dL — ABNORMAL HIGH (ref 70–99)
Glucose-Capillary: 96 mg/dL (ref 70–99)

## 2023-03-15 LAB — HEMOGLOBIN AND HEMATOCRIT, BLOOD
HCT: 22.5 % — ABNORMAL LOW (ref 36.0–46.0)
Hemoglobin: 7.3 g/dL — ABNORMAL LOW (ref 12.0–15.0)

## 2023-03-15 MED ORDER — PREDNISONE 20 MG PO TABS
40.0000 mg | ORAL_TABLET | Freq: Every day | ORAL | Status: DC
  Administered 2023-03-16 – 2023-03-18 (×3): 40 mg via ORAL
  Filled 2023-03-15 (×4): qty 2

## 2023-03-15 MED ORDER — PANTOPRAZOLE SODIUM 40 MG IV SOLR
40.0000 mg | Freq: Two times a day (BID) | INTRAVENOUS | Status: DC
  Administered 2023-03-15 – 2023-03-18 (×6): 40 mg via INTRAVENOUS
  Filled 2023-03-15 (×8): qty 10

## 2023-03-15 NOTE — Progress Notes (Signed)
Gastroenterology Progress Note   Referring Provider: No ref. provider found Primary Care Physician:  [name], MD Primary Gastroenterologist:  Dr. [name] Payor  Patient ID: Heather [name]; [id number]; [birth year]    Subjective   Patient feeling well. Denies any N/V, abdominal pain.  Morphine more than clear liquids.  Unsure when her stools started to feel looser.  Denies any fevers or chills.   Objective   Vital signs in last 24 hours Temp:  [97.7 F (36.5 C)-97.9 F (36.6 C)] 97.7 F (36.5 C) (05/17 0621) Pulse Rate:  [78-94] 85 (05/17 0621) Resp:  [8-23] 12 (05/17 0621) BP: (91-141)/(41-116) 91/75 (05/17 0621) SpO2:  [100 %] 100 % (05/17 0621) Last BM Date : 03/14/23  Physical Exam General:   Alert and oriented, pleasant Head:  Normocephalic and atraumatic. Eyes:  No icterus, sclera clear. Conjuctiva pink.  Abdomen:  Bowel sounds present, soft, non-tender, non-distended. No HSM or hernias noted. No rebound or guarding. No masses appreciated  Extremities:  Without clubbing or edema. Neurologic:  Alert and  oriented x4;  grossly normal neurologically. Skin:  Warm and dry, intact without significant lesions.  Psych:  Alert and cooperative. Normal mood and affect.  Intake/Output from previous day: 05/16 0701 - 05/17 0700 In: 2.2 [I.V.:2.2] Out: 450 [Urine:450] Intake/Output this shift: Total I/O In: 393 [P.O.:240; I.V.:153] Out: -   Lab Results  Recent Labs    03/13/23 1756 03/14/23 0445 03/14/23 1620 03/15/23 0801  WBC 12.3* 7.8  --  9.8  HGB 6.3* 7.0* 7.1* 7.4*  HCT 20.1* 21.2* 21.3* 22.9*  PLT 272 146*  --  164   BMET Recent Labs    03/13/23 1756 03/14/23 0416  NA 132* 132*  K 4.9 4.7  CL 105 107  CO2 20* 18*  GLUCOSE 311* 291*  BUN 73* 75*  CREATININE 1.72* 1.74*  CALCIUM 7.8* 7.7*   LFT Recent Labs    03/13/23 1756 03/14/23 0416  PROT 4.5* 4.1*  ALBUMIN 1.9* 1.8*  AST 33 25  ALT 22 19  ALKPHOS 79 69  BILITOT 0.6 0.6    PT/INR No results for input(s): "LABPROT", "INR" in the last 72 hours. Hepatitis Panel No results for input(s): "HEPBSAG", "HCVAB", "HEPAIGM", "HEPBIGM" in the last 72 hours.  Studies/Results CT ANGIO GI BLEED  Result Date: 03/14/2023 CLINICAL DATA:  gi bleed, acute blood loss anemia wall EXAM: CTA ABDOMEN AND PELVIS WITHOUT AND WITH CONTRAST TECHNIQUE: Multidetector CT imaging of the abdomen and pelvis was performed using the standard protocol during bolus administration of intravenous contrast. Multiplanar reconstructed images and MIPs were obtained and reviewed to evaluate the vascular anatomy. RADIATION DOSE REDUCTION: This exam was performed according to the departmental dose-optimization program which includes automated exposure control, adjustment of the mA and/or kV according to patient size and/or use of iterative reconstruction technique. CONTRAST:  75mL OMNIPAQUE IOHEXOL 350 MG/ML SOLN COMPARISON:  02/17/2023 FINDINGS: VASCULAR Aorta: Moderate partially calcified atheromatous plaque particularly in the infrarenal segment. No aneurysm, dissection, or stenosis. Celiac: Patent without evidence of aneurysm, dissection, vasculitis or significant stenosis. Common hepatic artery arises directly the aorta, an anatomic variant. SMA: Calcified ostial plaque with only mild stenosis, atheromatous but patent distally with classic distal branch anatomy. Renals: Single left, with calcified ostial plaque extending over length of at least 1.3 cm resulting and at least mild stenosis, patent distally. Single right, with heavily calcified ostial plaque result extending over length of at least 1.8 cm, resulting in at least moderate stenosis, patent distally.  IMA: Patent without evidence of aneurysm, dissection, vasculitis or significant stenosis. Inflow: Moderate calcified atheromatous plaque. Common and external iliac arteries are patent. Segmental occlusion or high-grade stenosis in the proximal right internal  iliac artery, patent distally. Proximal Outflow: Moderately atheromatous, without high-grade stenosis or occlusion. Veins: Patent hepatic veins, portal vein, SMV. Venous collateral channels from the splenic hilum suggesting central stenosis or occlusion of the splenic vein, which is poorly visualized. Iliac venous system, IVC, and renal veins unremarkable. Review of the MIP images confirms the above findings. NON-VASCULAR Lower chest: Small bilateral pleural effusions as before, with dependent atelectasis/consolidation in both lung bases. Hepatobiliary: No focal liver abnormality is seen. Status post cholecystectomy. No biliary dilatation. Pancreas: Parenchymal atrophy with mild ductal dilatation in the body. There is a 2.4 cm low-attenuation process with soft tissue rim in the pancreatic tail, as seen on previous study. Spleen: Normal in size without focal abnormality. Adrenals/Urinary Tract: No adrenal mass. Symmetric renal parenchymal enhancement without worrisome lesion or hydronephrosis. Urinary bladder distended. Stomach/Bowel: No acute intraluminal extravasation identified. Stomach is decompressed. There is circumferential low-attenuation wall thickening the proximal duodenum. Small bowel is nondistended. Appendix not identified. The colon is incompletely distended bowel gas and fecal material, without acute finding. Lymphatic: No abdominal or pelvic adenopathy. Reproductive: Status post hysterectomy. No adnexal masses. Other: Small volume pelvic and perihepatic ascites.  No free air. Musculoskeletal: Diffuse body wall anasarca. Fixation hardware across right femoral IT fracture. Subacute appearing T12 vertebral compression fracture, stable since 02/17/2023. Post instrumented fusion L1-L4. IMPRESSION: 1. No acute intraluminal bleeding identified. 2. Wall thickening in the proximal duodenum suggesting duodenitis. 3. Stable 2.4 cm low-attenuation process in the pancreatic tail, possibly pseudocyst related to  previous pancreatitis. There may be associated splenic vein occlusion. 4. Small bilateral pleural effusions, ascites, and body wall anasarca. 5.  Aortic Atherosclerosis (ICD10-I70.0). Electronically Signed   By: Corlis Leak M.D.   On: 03/14/2023 17:00   NM GI Blood Loss  Result Date: 03/14/2023 CLINICAL DATA:  GI bleeding. EXAM: NUCLEAR MEDICINE GASTROINTESTINAL BLEEDING SCAN TECHNIQUE: Sequential abdominal images were obtained following intravenous administration of Tc-54m labeled red blood cells. Imaging was performed for 120 minutes. RADIOPHARMACEUTICALS:  25 mCi Tc-17m pertechnetate in-vitro labeled red cells. COMPARISON:  None Available. FINDINGS: Normal distribution of radiopharmaceutical activity is seen within the abdomen and pelvis. No active GI bleeding is seen during the 1st or 2nd hours of the study. IMPRESSION: No evidence of active GI bleeding. Electronically Signed   By: Danae Orleans M.D.   On: 03/14/2023 16:32   US Venous Img Lower Unilateral Left (DVT)  Result Date: 03/14/2023 CLINICAL DATA:  Left lower extremity edema. EXAM: LEFT LOWER EXTREMITY VENOUS DOPPLER ULTRASOUND TECHNIQUE: Gray-scale sonography with graded compression, as well as color Doppler and duplex ultrasound were performed to evaluate the lower extremity deep venous systems from the level of the common femoral vein and including the common femoral, femoral, profunda femoral, popliteal and calf veins including the posterior tibial, peroneal and gastrocnemius veins when visible. The superficial great saphenous vein was also interrogated. Spectral Doppler was utilized to evaluate flow at rest and with distal augmentation maneuvers in the common femoral, femoral and popliteal veins. COMPARISON:  None Available. FINDINGS: Contralateral Common Femoral Vein: Respiratory phasicity is normal and symmetric with the symptomatic side. No evidence of thrombus. Normal compressibility. Common Femoral Vein: No evidence of thrombus. Normal  compressibility, respiratory phasicity and response to augmentation. Saphenofemoral Junction: No evidence of thrombus. Normal compressibility and flow on color Doppler  imaging. Profunda Femoral Vein: No evidence of thrombus. Normal compressibility and flow on color Doppler imaging. Femoral Vein: No evidence of thrombus. Normal compressibility, respiratory phasicity and response to augmentation. Popliteal Vein: No evidence of thrombus. Normal compressibility, respiratory phasicity and response to augmentation. Calf Veins: No evidence of thrombus. Normal compressibility and flow on color Doppler imaging. Superficial Great Saphenous Vein: No evidence of thrombus. Normal compressibility. Venous Reflux:  None. Other Findings: Fluid collection in the left popliteal fossa is consistent with a Baker's cyst and measures approximately 4 x 1.2 x 2.5 cm. IMPRESSION: 1. No evidence of left lower extremity deep vein thrombosis. 2. Left popliteal fossa Baker's cyst. Electronically Signed   By: Irish Lack M.D.   On: 03/14/2023 11:10   DG Abd 1 View  Result Date: 03/14/2023 CLINICAL DATA:  Foreign body of alimentary tract, initial encounter. Follow-up. EXAM: ABDOMEN - 1 VIEW COMPARISON:  KUB 03/06/2023 and 03/05/2023 FINDINGS: Air is seen within nondistended loops of small and large bowel. Right upper quadrant cholecystectomy clips are again seen. The prior approximate 1.1 cm density previously overlying the ascending colon within the right lower abdominal quadrant is no longer visualized. Mild stool within the ascending colon, decreased from prior. No portal venous gas or pneumatosis is seen, noting the superior aspect of the liver is not imaged. Vascular phleboliths overlie the pelvis. There is diffuse decreased bone mineralization. Lumbar rod and screw fusion hardware and right femoral cephalomedullary nail fixation are again noted. IMPRESSION: 1. The prior approximate 1.1 cm density previously overlying the ascending  colon within the right lower abdominal quadrant is no longer visualized. Presumably the endoscopy capsule has passed. 2. Nonobstructive bowel gas pattern. Electronically Signed   By: Neita Garnet M.D.   On: 03/14/2023 09:10   [place] EKG SITE RITE  Result Date: 03/14/2023 If Site Rite image not attached, placement could not be confirmed due to current cardiac rhythm.  [place] MESENTERIC ARTERIES  Result Date: 03/07/2023 CLINICAL DATA:  Anemia Rectal bleeding Hypertension Stroke Diabetes Hyperlipidemia EXAM: [place] MESENTERIC ARTERIAL DOPPLER COMPARISON:  CT abdomen pelvis without contrast 02/17/2023 FINDINGS: Celiac axis: 99 cm/sec Celiac axis with inspiration: 102 cm/sec Celiac axis with expiration: 97 cm/sec Splenic artery: 93 cm/sec Hepatic artery: 110 cm/sec SMA: 242 cm/sec IMA: 112 cm/sec Aorta: 145 cm/sec Aortic size: 1.8 cm proximally, 1.4 cm in the mid segment and 1.2 cm distally IMPRESSION: 1. No hemodynamically significant stenosis of the celiac, superior mesenteric, or inferior mesenteric arteries. 2. Atherosclerotic calcifications seen throughout the abdominal aorta. Electronically Signed   By: Acquanetta Belling M.D.   On: 03/07/2023 15:25   DG Abd 1 View  Result Date: 03/06/2023 CLINICAL DATA:  Retained foreign body. EXAM: ABDOMEN - 1 VIEW COMPARISON:  03/05/2023. FINDINGS: The bowel gas pattern is normal. A moderate amount of retained stool is present in the colon. Cholecystectomy clips are noted in the right upper quadrant. A 1.1 cm radiopaque density projects over the right lower quadrant which was seen on the prior exam. Extensive vascular calcifications are noted in the left upper quadrant, pelvis, and bilateral lower extremities. Fixation hardware is noted in the proximal right femur. Lumbar spinal fusion hardware is noted. IMPRESSION: 1. Radiopaque density in the right lower quadrant measuring 1.1 cm, unchanged from the prior exam, possible endoscopy capsule. 2. Nonobstructive bowel-gas pattern. 3.  Moderate amount of retained stool in the colon. Electronically Signed   By: Thornell Sartorius M.D.   On: 03/06/2023 04:55   DG Abd 1 View  Result  Date: 03/05/2023 CLINICAL DATA:  Retained foreign body. Retained capsule from yesterday. EXAM: ABDOMEN - 1 VIEW COMPARISON:  CT abdomen/pelvis 02/17/2023. FINDINGS: No dilated loops of small bowel are demonstrated to suggest small bowel obstruction. Moderate colonic stool burden. A 1.7 cm dense object projects in the region of the right lower quadrant of the abdomen/right hemipelvis (see annotations on images). Surgical clips within the right upper quadrant of the abdomen. Lumbar posterior spinal fusion construct. Prior ORIF of the right femur, incompletely imaged.No acute osseous abnormality identified. IMPRESSION: 1. 1.7 cm dense object projecting in the region of the right lower quadrant of the abdomen/right hemipelvis, likely reflecting the reported endoscopy capsule. 2. Nonobstructive bowel gas pattern. 3. Moderate colonic stool burden. Electronically Signed   By: Jackey Loge D.O.   On: 03/05/2023 14:05   [place] EKG SITE RITE  Result Date: 02/27/2023 If Site Rite image not attached, placement could not be confirmed due to current cardiac rhythm.  CT ABDOMEN PELVIS WO CONTRAST  Result Date: 02/17/2023 CLINICAL DATA:  Concern for bowel obstruction. Patient reportedly has red stool. EXAM: CT ABDOMEN AND PELVIS WITHOUT CONTRAST TECHNIQUE: Multidetector CT imaging of the abdomen and pelvis was performed following the standard protocol without IV contrast. RADIATION DOSE REDUCTION: This exam was performed according to the departmental dose-optimization program which includes automated exposure control, adjustment of the mA and/or kV according to patient size and/or use of iterative reconstruction technique. COMPARISON:  CT abdomen pelvis dated 02/04/2023. FINDINGS: Lower chest: Moderate bilateral pleural effusions with associated atelectasis are partially imaged. The  heart is mildly enlarged. The blood pool is hypoattenuating, suggestive of anemia. Hepatobiliary: No focal liver abnormality is seen. Status post cholecystectomy. No biliary dilatation. Pancreas: Atrophic. A 1.8 cm low-density lesion in the pancreatic tail appears unchanged. No pancreatic ductal dilatation or surrounding inflammatory changes. Spleen: Normal in size without focal abnormality. Adrenals/Urinary Tract: Adrenal glands are unremarkable. Bilateral punctate calcifications in both kidneys may represent nonobstructing calculi measuring up to 2 mm and/or vascular calcifications. No suspicious focal lesion or hydronephrosis on either side. Bladder is unremarkable. Stomach/Bowel: Stomach is within normal limits. The appendix is not definitely identified, however no pericecal inflammatory changes are noted to suggest acute appendicitis. There is a mild bowel wall thickening and mucosal enhancement involving portions of the sigmoid colon and rectum. No significant surrounding inflammatory changes. No bowel obstruction. Vascular/Lymphatic: Aortic atherosclerosis. No enlarged abdominal or pelvic lymph nodes. Reproductive: Status post hysterectomy. No adnexal masses. Other: No abdominal wall hernia or abnormality. No abdominopelvic ascites. Anasarca is noted Musculoskeletal: There has been slight progression since 02/04/2023 of a T12 compression deformity with approximately 50% height loss centrally and 3 mm retropulsion into the central canal. Fixation hardware is seen in the right femur. Fixation hardware in the lumbar spine from L1-L4. IMPRESSION: 1. Mild bowel wall thickening and mucosal enhancement involving portions of the sigmoid colon and rectum may reflect proctocolitis. No bowel obstruction. 2. Moderate bilateral pleural effusions with associated atelectasis. 3. Slight progression since 02/04/2023 of a T12 compression deformity with approximately 50% height loss centrally and 3 mm retropulsion into the  central canal. 4. Low-density pancreatic lesion. When the patient is clinically stable and able to follow directions and hold their breath (preferably as an outpatient) further evaluation with dedicated abdominal MRI could be considered for further characterization. Aortic Atherosclerosis (ICD10-I70.0). Electronically Signed   By: Romona Curls M.D.   On: 02/17/2023 17:36    Assessment  72 y.o. female with a history of small bowel  Crohn's (suspected for years but no objective findings to support diagnosis) for which she was treated in early 2022 after small bowel capsule demonstrated numerous aphthous ulcers in the small bowel.  She failed budesonide in the past and was started on Humira but was discontinued at some point between 2022 and 2023.  She also has a history of pancreatitis possibly related to hypertriglyceridemia and use of Victoza and Ozempic, CKD, depression, diabetes, GERD, IBS, anxiety, HLD, and stroke with multiple recent admissions for GI bleeding/acute on chronic anemia.  Most recently discharged on May 11.  She represented with recurrent GI bleed w 03/13/2023 with subsequent drop in hemoglobin.  Recurrent GI bleeding: Nursing previously reported brown stools with red blood however patient's caregiver reported melena prior to arrival.  She has been experiencing melena overnight per nursing and rectal tube placed.  She was recently discharged on melena with a hemoglobin of 8 and presented with a drop to 6.3.  He has had extensive endoscopic workup since April for recurrent rectal bleeding.  Initially found to have esophageal and duodenal ulcers with active bleeding in the setting of NSAID use, repeat endoscopy May 2 with improvement esophagitis and ulcer.  She underwent colonoscopy 5/3 with normal TI and unremarkable colon as well as small bowel capsule study 5/7 showing ulcerated and erythematous small bowel most severe in the proximal small bowel, and scattered abrasions or small AVMs noted.   Due to findings on capsule small bowel enteroscopy completed 5/8 with improvement in esophagitis, duodenal ulceration, small bowel erosions with friability extending down to the proximal jejunum without identifiable treatable lesion. Hgb sable today at 7.4.  Patient is not interested in further endoscopic workup at this time. Will continue to trend H/H.   Clinical presentation is not felt to be typical of routine peptic ulcer disease therefore we have suspected her bleeding to be secondary to Crohn's or vascular ischemia however NM GI bleed scan and CTA yesterday overall unremarkable.  She has been maintained on budesonide recently for suspected Crohn's enteritis and continues to have bleeding without any significant abdominal pain.  Per review of chart appears she has failed budesonide in the past which is why she was placed on Humira.  For now we will stop budesonide and trial prednisone 40 mg once daily.  Given her ulcerations and friability within her small bowel Zollinger-Ellison syndrome remains within the differential therefore we will check a fasting gastrin level tomorrow.  Plan / Recommendations  Stop budesonide, start prednisone 40 mg daily.  CBC today Monitor H/H, transfuse as needed Fasting gastrin tomorrow Okay to stop PPI infusion and transition to PPI IV BID Soft diet    LOS: 0 days    03/15/2023, 8:51 AM   Brooke Bonito, MSN, FNP-BC, AGACNP-BC [facility]

## 2023-03-15 NOTE — Progress Notes (Addendum)
Pt's BLE +3, RUE +4, LUE +3. BP low, RR decreased. MD notified. Pt had 3 liquid bowel movements in less than 2 hours. MD notified, FlexiSeal inserted. Moisture barrier cream and sacral foam applied to pt.   03/15/23 0430  Assess: MEWS Score  Temp 97.7 F (36.5 C)  BP 93/68  MAP (mmHg) 78  Pulse Rate 89  Resp 12  SpO2 100 %  O2 Device Room Air  Assess: MEWS Score  MEWS Temp 0  MEWS Systolic 1  MEWS Pulse 0  MEWS RR 1  MEWS LOC 0  MEWS Score 2  MEWS Score Color Yellow  Assess: if the MEWS score is Yellow or Red  Were vital signs taken at a resting state? Yes  Focused Assessment Change from prior assessment (see assessment flowsheet)  Does the patient meet 2 or more of the SIRS criteria? No  Does the patient have a confirmed or suspected source of infection? No  MEWS guidelines implemented  Yes, yellow  Treat  MEWS Interventions Considered administering scheduled or prn medications/treatments as ordered  Take Vital Signs  Increase Vital Sign Frequency  Yellow: Q2hr x1, continue Q4hrs until patient remains green for 12hrs  Escalate  MEWS: Escalate Yellow: Discuss with charge nurse and consider notifying provider and/or RRT  Notify: Charge Nurse/RN  Name of Charge Nurse/RN Notified Tina, RN  Provider Notification  Provider Name/Title Thomes Dinning, DO  Date Provider Notified 03/15/23  Time Provider Notified [phone]  Method of Notification Page  Notification Reason Change in status  Assess: SIRS CRITERIA  SIRS Temperature  0  SIRS Pulse 0  SIRS Respirations  0  SIRS WBC 0  SIRS Score Sum  0

## 2023-03-15 NOTE — Care Management Obs Status (Signed)
MEDICARE OBSERVATION STATUS NOTIFICATION   Patient Details  Name: PORSCHEA BATTE MRN: [id number] Date of Birth: 1951/01/23   Medicare Observation Status Notification Given:  Yes    Corey Harold 03/15/2023, 9:29 AM

## 2023-03-15 NOTE — Progress Notes (Signed)
PROGRESS NOTE  Heather [name], is a 72 y.o. female, DOB - 1950-12-18, ZOX:[id number]  Admit date - 03/13/2023   Admitting Physician [name], DO  Outpatient Primary MD for the patient is [name], MD  LOS - 0  Chief Complaint  Patient presents with   Rectal Bleeding       Brief Narrative:  71 y.o. female with medical history significant for recurrent GI bleed, CKD 4, CVA, dCHF,  DM2, HTN, history of prior stroke with history of peptic ulcer disease with recent bleeding from duodenal ulcer,, small bowel Crohn's disease admitted on 03/13/2023 with  concerns about melena/rectal bleeding and hemoglobin down to 6.3  Dates/Pertinent events: 04/13/11: Presents for OV with Dr. [name] Payor with multiple GI complaints. EGD/Colonoscopy scheduled. 05/18/11: Gastric emptying study, 61% retention (abnormal). 05/29/11: EGD/Colonoscopy: Terminal ileal erosions seen and prescribed Entocort for possible Crohn's disease. Pathology showed IBD/erosisions. Unk date: Hosp at [facility] for abd pain, N/V. Treated with IV Solumedrol with improvement. Thought terminal ileal erosions may be due to NSAIDs. ESR 47. Reglan prescribed. 07/05/11: F/U OV with Dr. [name] Payor. No benefit from Entocort or Reglan, still reports bloating and vomiting despite small intake. Noted to have iron deficiency/B12 deficiency anemia. Reglan & Entocort d/c'd. Advised to avoid NSAIDs (Indomethacin). 07/17/11: Hospitalized for N/V/D, --- again found to be hypokalemic--- s/p capsule endo suggestive of Crohn's disease/gastroparesis. Treated with IV Zofran, Dilaudid, IV Reglan. 07/19/11: Underwent capsule endoscopy, found small bowel erosions and 1 aphthous ulcer thought to be from mild Crohn's disease. Entocort re-started. Amitriptyline started. Noted to have some issues with confusion which improved on d/c of Phenergan/narcotics. 01/13/15: Hospitalized at [facility]. MRI brain neg. 01/21/16: Hospitalized with cyclic vomiting and GIB. EGD:  Prepyloric ulcer. H. Pylori neg. Once again, told to d/c Indocin. 08/21/16: OV with GI. Reported couldn't afford co-pays on meds prescribed at [facility]. Scheduled for EGD/colonoscopy. Rx: Reglan. 11/28/20: OV GI. Plan to start 6-MP and Humira vs. Cimzia. Testing done for Thiopurine methyltransferase, hep B, TB. Celiac testing neg. 12/01/20: Starter pack for Humira prescribed. "Passed capsule." 02/17/23: Hosp for GIB, hgb 3.2. EGD/Sigmoidoscopy showed esophageal and duodenal ulcers. Repeat EGD 02/22/23 d/t ongoing bleeding. Ongoing NSAID use noted. 02/26/23: Colonoscopy 03/01/23 neg for inflammation.  03/06/23--- EGD with push enteroscopy done on  03/06/23: Friable mucosa, mesenteric ischemia. Discharge on 03/09/2023 -Readmitted on 03/13/2023   -Assessment and Plan: 1)Acute GI bleed -- Hgb up to 7.0 from 6.3 after transfusion of 1 unit of PRBC on 03/13/2023  S/p recent  EGD and flex sig 4/23 and repeat EGD 4/26 due to persistent bleeding Initial EGD from 02/19/2023 showed esophagitis with ulcers as well extensive duodenal ulceration on initial EGD.  Visible vessel treated with epinephrine and hemostasis clip.  She developed recurrent bleeding  - underwent a repeat EGD on 02/22/23 which revealed a visible vessel at a different site in the ulcer crater requiring epinephrine and clip placement.   -H. pylori negative - Limited FS on 02/22/2023 was with  poor prep nondiagnostic. ---Colonoscopy on 03/01/2023 shows no presence of inflammation hematin or active bleeding was present thorughout the colon.  -03/06/23--- EGD with push enteroscopy done on  03/06/23: Friable mucosa, mesenteric ischemia. -CT angio GI bleed from 03/14/2023 shows evidence of duodenitis, without acute intra-abdominal bleeding identified -RBC nuclear tagged scan from 03/14/2023 without active bleeding -c/n IV Protonix and Carafate -GI consult appreciated 03/15/23 -Despite stable Hgb around 7.... Patient is starting to have melena again, rectal tube  inserted -Anticipate Hgb will drop again -Awaiting further  input from GI team to see if patient benefits from IV steroids for presumed colitis flareup -Currently on Entocort -Previously on Humira   2)Chronic diastolic CHF (congestive heart failure) (HCC) Stable and compensated.  Last echo 11/2022, EF of 60 to 65%, grade 1 DD.  Not on diuretic. -Monitor closely   3)Diabetes mellitus (HCC) -A1c 6.4 reflecting good diabetic control PTA -Hold Lantus until oral intake is more reliable Use Novolog/Humalog Sliding scale insulin with Accu-Cheks/Fingersticks as ordered    4)HTN Stable, continue metoprolol succinate 37.5 mg daily   5)Crohn's disease of small intestine without complication (HCC) Stable.  Previously treated with Humira currently not on medications for this -Recent biopsy during endoscopy was nondiagnostic --Awaiting further input from GI team to see if patient benefits from IV steroids for presumed colitis flareup -Currently on Entocort -Previously on Humira   6) CKD stage -IV -Creatinine currently 1.7 continue to monitor closely especially due to contrast study -Renally adjust medications, avoid nephrotoxic agents / dehydration  / hypotension   7)Mild Hyponatremia, not POA----suspect dehydration related, monitor closely  8) acute on chronic anemia due to GI bleed--folate and B12 are not low --- Hgb up to 7.4 from 6.3 after transfusion of 1 unit of PRBC on 03/13/2023  9) social/ethics--- palliative care consult appreciated -She is apparently a retired Public house manager -Patient is a full code -Palliative care conference scheduled for Monday 07/19/23 at 9 AM with patient and family members   Status is: Inpatient   Disposition: The patient is from: Home              Anticipated d/c is to: Home              Anticipated d/c date is: 2 days              Patient currently is not medically stable to d/c. Barriers: Not Clinically Stable-   Code Status :  -  Code Status: Full Code    Family Communication:   Husband is primary contact  DVT Prophylaxis  :   - SCDs  SCDs Start: 03/13/23 2212   Lab Results  Component Value Date   PLT 164 03/15/2023    Inpatient Medications  Scheduled Meds:  atorvastatin  80 mg Oral Daily   budesonide  9 mg Oral Daily   Chlorhexidine Gluconate Cloth  6 each Topical Daily   feeding supplement (GLUCERNA SHAKE)  237 mL Oral TID BM   insulin aspart  0-9 Units Subcutaneous Q4H   metoprolol succinate  37.5 mg Oral Daily   sodium chloride flush  10-40 mL Intracatheter Q12H   sucralfate  1 g Oral TID WC & HS   Continuous Infusions:  magnesium sulfate bolus IVPB     pantoprazole 8 mg/hr (03/15/23 0818)   PRN Meds:.acetaminophen, ondansetron **OR** ondansetron (ZOFRAN) IV, prochlorperazine, sodium chloride flush   Anti-infectives (From admission, onward)    None         Subjective: [name] today has no fevers, no emesis,  No chest pain,   - -Had maroon stool yesterday -Having melena today  Objective: Vitals:   03/14/23 1839 03/14/23 2201 03/15/23 0430 03/15/23 0621  BP: 115/67 (!) 97/49 93/68 91/75   Pulse: 94 91 89 85  Resp: 20 20 12 12   Temp: 97.8 F (36.6 C) 97.8 F (36.6 C) 97.7 F (36.5 C) 97.7 F (36.5 C)  TempSrc: Oral Oral Oral Oral  SpO2: 100% 100% 100% 100%  Weight:      Height:  Intake/Output Summary (Last 24 hours) at 03/15/2023 1051 Last data filed at 03/15/2023 0830 Gross per 24 hour  Intake 395.18 ml  Output 450 ml  Net -54.82 ml   Filed Weights   03/13/23 1650  Weight: 54.4 kg   Physical Exam  Gen:- Awake Alert, No conversational dyspnea HEENT:- [place].AT, No sclera icterus Neck-Supple Neck,No JVD,.  Lungs-  CTAB , fair symmetrical air movement CV- S1, S2 normal, regular  Abd-  +ve B.Sounds, Abd Soft, No significant tenderness,    Extremity/Skin:-Significant bilateral upper extremity edema, pedal pulses present  Psych-affect is mostly appropriate, oriented x3,  occasional episodes of forgetfulness and anxiety Neuro-generalized weakness, no new focal deficits, no tremors MSK-left arm PICC line placed on 03/14/2023  Data Reviewed: I have personally reviewed following labs and imaging studies  CBC: Recent Labs  Lab 03/09/23 0500 03/13/23 1756 03/14/23 0445 03/14/23 1620 03/15/23 0801  WBC 6.9 12.3* 7.8  --  9.8  HGB 8.4* 6.3* 7.0* 7.1* 7.4*  HCT 25.3* 20.1* 21.2* 21.3* 22.9*  MCV 93.7 100.5* 94.2  --  95.4  PLT 160 272 146*  --  164   Basic Metabolic Panel: Recent Labs  Lab 03/09/23 0500 03/13/23 1756 03/14/23 0416  NA 131* 132* 132*  K 4.7 4.9 4.7  CL 106 105 107  CO2 20* 20* 18*  GLUCOSE 60* 311* 291*  BUN 50* 73* 75*  CREATININE 1.29* 1.72* 1.74*  CALCIUM 8.3* 7.8* 7.7*  MG  --   --  1.4*  PHOS  --   --  3.2   GFR: Estimated Creatinine Clearance: 25.8 mL/min (A) (by C-G formula based on SCr of 1.74 mg/dL (H)). Liver Function Tests: Recent Labs  Lab 03/13/23 1756 03/14/23 0416  AST 33 25  ALT 22 19  ALKPHOS 79 69  BILITOT 0.6 0.6  PROT 4.5* 4.1*  ALBUMIN 1.9* 1.8*   Radiology Studies: CT ANGIO GI BLEED  Result Date: 03/14/2023 CLINICAL DATA:  gi bleed, acute blood loss anemia wall EXAM: CTA ABDOMEN AND PELVIS WITHOUT AND WITH CONTRAST TECHNIQUE: Multidetector CT imaging of the abdomen and pelvis was performed using the standard protocol during bolus administration of intravenous contrast. Multiplanar reconstructed images and MIPs were obtained and reviewed to evaluate the vascular anatomy. RADIATION DOSE REDUCTION: This exam was performed according to the departmental dose-optimization program which includes automated exposure control, adjustment of the mA and/or kV according to patient size and/or use of iterative reconstruction technique. CONTRAST:  75mL OMNIPAQUE IOHEXOL 350 MG/ML SOLN COMPARISON:  02/17/2023 FINDINGS: VASCULAR Aorta: Moderate partially calcified atheromatous plaque particularly in the infrarenal  segment. No aneurysm, dissection, or stenosis. Celiac: Patent without evidence of aneurysm, dissection, vasculitis or significant stenosis. Common hepatic artery arises directly the aorta, an anatomic variant. SMA: Calcified ostial plaque with only mild stenosis, atheromatous but patent distally with classic distal branch anatomy. Renals: Single left, with calcified ostial plaque extending over length of at least 1.3 cm resulting and at least mild stenosis, patent distally. Single right, with heavily calcified ostial plaque result extending over length of at least 1.8 cm, resulting in at least moderate stenosis, patent distally. IMA: Patent without evidence of aneurysm, dissection, vasculitis or significant stenosis. Inflow: Moderate calcified atheromatous plaque. Common and external iliac arteries are patent. Segmental occlusion or high-grade stenosis in the proximal right internal iliac artery, patent distally. Proximal Outflow: Moderately atheromatous, without high-grade stenosis or occlusion. Veins: Patent hepatic veins, portal vein, SMV. Venous collateral channels from the splenic hilum suggesting central stenosis or  occlusion of the splenic vein, which is poorly visualized. Iliac venous system, IVC, and renal veins unremarkable. Review of the MIP images confirms the above findings. NON-VASCULAR Lower chest: Small bilateral pleural effusions as before, with dependent atelectasis/consolidation in both lung bases. Hepatobiliary: No focal liver abnormality is seen. Status post cholecystectomy. No biliary dilatation. Pancreas: Parenchymal atrophy with mild ductal dilatation in the body. There is a 2.4 cm low-attenuation process with soft tissue rim in the pancreatic tail, as seen on previous study. Spleen: Normal in size without focal abnormality. Adrenals/Urinary Tract: No adrenal mass. Symmetric renal parenchymal enhancement without worrisome lesion or hydronephrosis. Urinary bladder distended. Stomach/Bowel: No  acute intraluminal extravasation identified. Stomach is decompressed. There is circumferential low-attenuation wall thickening the proximal duodenum. Small bowel is nondistended. Appendix not identified. The colon is incompletely distended bowel gas and fecal material, without acute finding. Lymphatic: No abdominal or pelvic adenopathy. Reproductive: Status post hysterectomy. No adnexal masses. Other: Small volume pelvic and perihepatic ascites.  No free air. Musculoskeletal: Diffuse body wall anasarca. Fixation hardware across right femoral IT fracture. Subacute appearing T12 vertebral compression fracture, stable since 02/17/2023. Post instrumented fusion L1-L4. IMPRESSION: 1. No acute intraluminal bleeding identified. 2. Wall thickening in the proximal duodenum suggesting duodenitis. 3. Stable 2.4 cm low-attenuation process in the pancreatic tail, possibly pseudocyst related to previous pancreatitis. There may be associated splenic vein occlusion. 4. Small bilateral pleural effusions, ascites, and body wall anasarca. 5.  Aortic Atherosclerosis (ICD10-I70.0). Electronically Signed   By: Corlis Leak M.D.   On: 03/14/2023 17:00   NM GI Blood Loss  Result Date: 03/14/2023 CLINICAL DATA:  GI bleeding. EXAM: NUCLEAR MEDICINE GASTROINTESTINAL BLEEDING SCAN TECHNIQUE: Sequential abdominal images were obtained following intravenous administration of Tc-4m labeled red blood cells. Imaging was performed for 120 minutes. RADIOPHARMACEUTICALS:  25 mCi Tc-87m pertechnetate in-vitro labeled red cells. COMPARISON:  None Available. FINDINGS: Normal distribution of radiopharmaceutical activity is seen within the abdomen and pelvis. No active GI bleeding is seen during the 1st or 2nd hours of the study. IMPRESSION: No evidence of active GI bleeding. Electronically Signed   By: Danae Orleans M.D.   On: 03/14/2023 16:32   US Venous Img Lower Unilateral Left (DVT)  Result Date: 03/14/2023 CLINICAL DATA:  Left lower extremity  edema. EXAM: LEFT LOWER EXTREMITY VENOUS DOPPLER ULTRASOUND TECHNIQUE: Gray-scale sonography with graded compression, as well as color Doppler and duplex ultrasound were performed to evaluate the lower extremity deep venous systems from the level of the common femoral vein and including the common femoral, femoral, profunda femoral, popliteal and calf veins including the posterior tibial, peroneal and gastrocnemius veins when visible. The superficial great saphenous vein was also interrogated. Spectral Doppler was utilized to evaluate flow at rest and with distal augmentation maneuvers in the common femoral, femoral and popliteal veins. COMPARISON:  None Available. FINDINGS: Contralateral Common Femoral Vein: Respiratory phasicity is normal and symmetric with the symptomatic side. No evidence of thrombus. Normal compressibility. Common Femoral Vein: No evidence of thrombus. Normal compressibility, respiratory phasicity and response to augmentation. Saphenofemoral Junction: No evidence of thrombus. Normal compressibility and flow on color Doppler imaging. Profunda Femoral Vein: No evidence of thrombus. Normal compressibility and flow on color Doppler imaging. Femoral Vein: No evidence of thrombus. Normal compressibility, respiratory phasicity and response to augmentation. Popliteal Vein: No evidence of thrombus. Normal compressibility, respiratory phasicity and response to augmentation. Calf Veins: No evidence of thrombus. Normal compressibility and flow on color Doppler imaging. Superficial Great Saphenous Vein: No evidence of  thrombus. Normal compressibility. Venous Reflux:  None. Other Findings: Fluid collection in the left popliteal fossa is consistent with a Baker's cyst and measures approximately 4 x 1.2 x 2.5 cm. IMPRESSION: 1. No evidence of left lower extremity deep vein thrombosis. 2. Left popliteal fossa Baker's cyst. Electronically Signed   By: Irish Lack M.D.   On: 03/14/2023 11:10   DG Abd 1  View  Result Date: 03/14/2023 CLINICAL DATA:  Foreign body of alimentary tract, initial encounter. Follow-up. EXAM: ABDOMEN - 1 VIEW COMPARISON:  KUB 03/06/2023 and 03/05/2023 FINDINGS: Air is seen within nondistended loops of small and large bowel. Right upper quadrant cholecystectomy clips are again seen. The prior approximate 1.1 cm density previously overlying the ascending colon within the right lower abdominal quadrant is no longer visualized. Mild stool within the ascending colon, decreased from prior. No portal venous gas or pneumatosis is seen, noting the superior aspect of the liver is not imaged. Vascular phleboliths overlie the pelvis. There is diffuse decreased bone mineralization. Lumbar rod and screw fusion hardware and right femoral cephalomedullary nail fixation are again noted. IMPRESSION: 1. The prior approximate 1.1 cm density previously overlying the ascending colon within the right lower abdominal quadrant is no longer visualized. Presumably the endoscopy capsule has passed. 2. Nonobstructive bowel gas pattern. Electronically Signed   By: Neita Garnet M.D.   On: 03/14/2023 09:10   [place] EKG SITE RITE  Result Date: 03/14/2023 If Site Rite image not attached, placement could not be confirmed due to current cardiac rhythm.    Scheduled Meds:  atorvastatin  80 mg Oral Daily   budesonide  9 mg Oral Daily   Chlorhexidine Gluconate Cloth  6 each Topical Daily   feeding supplement (GLUCERNA SHAKE)  237 mL Oral TID BM   insulin aspart  0-9 Units Subcutaneous Q4H   metoprolol succinate  37.5 mg Oral Daily   sodium chloride flush  10-40 mL Intracatheter Q12H   sucralfate  1 g Oral TID WC & HS   Continuous Infusions:  magnesium sulfate bolus IVPB     pantoprazole 8 mg/hr (03/15/23 0818)     LOS: 0 days   Shon Hale M.D on 03/15/2023 at 10:51 AM  Go to [email or web address] - for contact info  [facility] - Office  [phone]  If 7PM-7AM, please contact  night-coverage [email or web address] 03/15/2023, 10:51 AM

## 2023-03-16 DIAGNOSIS — K921 Melena: Secondary | ICD-10-CM

## 2023-03-16 DIAGNOSIS — K264 Chronic or unspecified duodenal ulcer with hemorrhage: Secondary | ICD-10-CM | POA: Diagnosis not present

## 2023-03-16 DIAGNOSIS — K922 Gastrointestinal hemorrhage, unspecified: Secondary | ICD-10-CM | POA: Diagnosis not present

## 2023-03-16 DIAGNOSIS — I5032 Chronic diastolic (congestive) heart failure: Secondary | ICD-10-CM | POA: Diagnosis not present

## 2023-03-16 LAB — GLUCOSE, CAPILLARY
Glucose-Capillary: 158 mg/dL — ABNORMAL HIGH (ref 70–99)
Glucose-Capillary: 232 mg/dL — ABNORMAL HIGH (ref 70–99)
Glucose-Capillary: 277 mg/dL — ABNORMAL HIGH (ref 70–99)
Glucose-Capillary: 290 mg/dL — ABNORMAL HIGH (ref 70–99)
Glucose-Capillary: 336 mg/dL — ABNORMAL HIGH (ref 70–99)
Glucose-Capillary: 339 mg/dL — ABNORMAL HIGH (ref 70–99)

## 2023-03-16 LAB — HEMOGLOBIN AND HEMATOCRIT, BLOOD
HCT: 22.5 % — ABNORMAL LOW (ref 36.0–46.0)
Hemoglobin: 7.2 g/dL — ABNORMAL LOW (ref 12.0–15.0)

## 2023-03-16 MED ORDER — OXYCODONE HCL 5 MG PO TABS
5.0000 mg | ORAL_TABLET | Freq: Two times a day (BID) | ORAL | Status: DC | PRN
  Administered 2023-03-16 – 2023-03-19 (×5): 5 mg via ORAL
  Filled 2023-03-16 (×5): qty 1

## 2023-03-16 MED ORDER — HYDROXYZINE HCL 10 MG PO TABS
10.0000 mg | ORAL_TABLET | Freq: Three times a day (TID) | ORAL | Status: DC | PRN
  Administered 2023-03-16 – 2023-03-18 (×8): 10 mg via ORAL
  Filled 2023-03-16 (×8): qty 1

## 2023-03-16 NOTE — Progress Notes (Signed)
Patient' heart rate increase into the 120's non sustained. MD on call notified.

## 2023-03-16 NOTE — Progress Notes (Addendum)
Only complaint is wanting more to eat today.  Denies abdominal pain  Vital signs in last 24 hours: Temp:  [98.4 F (36.9 C)-98.9 F (37.2 C)] 98.9 F (37.2 C) (05/18 0358) Pulse Rate:  [96-100] 100 (05/17 2104) Resp:  [15-18] 18 (05/18 0358) BP: (85-120)/(48-67) 120/58 (05/18 0358) SpO2:  [99 %-100 %] 99 % (05/18 0358) Last BM Date : 03/15/23 General:   Alert,   pleasant and cooperative in NAD Abdomen: Nondistended.  Positive bowel sounds soft and nontender.   Intake/Output from previous day: 05/17 0701 - 05/18 0700 In: 955.1 [P.O.:720; I.V.:235.1] Out: 800 [Urine:800] Intake/Output this shift: No intake/output data recorded.  Lab Results: Recent Labs    03/13/23 1756 03/14/23 0445 03/14/23 1620 03/15/23 0801 03/15/23 1311 03/16/23 0427  WBC 12.3* 7.8  --  9.8  --   --   HGB 6.3* 7.0*   < > 7.4* 7.3* 7.2*  HCT 20.1* 21.2*   < > 22.9* 22.5* 22.5*  PLT 272 146*  --  164  --   --    < > = values in this interval not displayed.   BMET Recent Labs    03/13/23 1756 03/14/23 0416  NA 132* 132*  K 4.9 4.7  CL 105 107  CO2 20* 18*  GLUCOSE 311* 291*  BUN 73* 75*  CREATININE 1.72* 1.74*  CALCIUM 7.8* 7.7*   LFT Recent Labs    03/14/23 0416  PROT 4.1*  ALBUMIN 1.8*  AST 25  ALT 19  ALKPHOS 69  BILITOT 0.6    Impression: 72 year old lady with history of recurrent overt GI bleeding of obscure etiology.  History of small bowel Crohn's.  Extensive duodenal ulcer disease successfully treated with endoscopic therapy.  Biopsies negative for H. pylori or malignancy. On prednisone 40 mg daily for presumed small bowel Crohn's disease. Twice daily PPI therapy needed for at least 12 weeks. Hemoglobin relatively stable in the 7.2 range.  Recommendations:  Regular diet  Follow H&H; as needed transfusion.  Follow-up on fasting gastrin to complete GI evaluation.  Continue twice daily PPI  Discussed with Dr. Marisa Severin.  If stable over the next 24 hours could consider  discharge.

## 2023-03-16 NOTE — Progress Notes (Signed)
Patient complained of PICC line site hurting. PICC flushed well, all connections are secure.  MD on call notified.

## 2023-03-16 NOTE — Progress Notes (Signed)
PROGRESS NOTE  Heather [name], is a 72 y.o. female, DOB - June 17, 1951, ZOX:[id number]  Admit date - 03/13/2023   Admitting Physician Jamarkis Branam [name], MD  Outpatient Primary MD for the patient is [name], MD  LOS - 1  Chief Complaint  Patient presents with   Rectal Bleeding       Brief Narrative:  72 y.o. female with medical history significant for recurrent GI bleed, CKD 4, CVA, dCHF,  DM2, HTN, history of prior stroke with history of peptic ulcer disease with recent bleeding from duodenal ulcer,, small bowel Crohn's disease admitted on 03/13/2023 with  concerns about melena/rectal bleeding and hemoglobin down to 6.3  Dates/Pertinent events: 04/13/11: Presents for OV with Dr. [name] Payor with multiple GI complaints. EGD/Colonoscopy scheduled. 05/18/11: Gastric emptying study, 61% retention (abnormal). 05/29/11: EGD/Colonoscopy: Terminal ileal erosions seen and prescribed Entocort for possible Crohn's disease. Pathology showed IBD/erosisions. Unk date: Hosp at [facility] for abd pain, N/V. Treated with IV Solumedrol with improvement. Thought terminal ileal erosions may be due to NSAIDs. ESR 47. Reglan prescribed. 07/05/11: F/U OV with Dr. [name] Payor. No benefit from Entocort or Reglan, still reports bloating and vomiting despite small intake. Noted to have iron deficiency/B12 deficiency anemia. Reglan & Entocort d/c'd. Advised to avoid NSAIDs (Indomethacin). 07/17/11: Hospitalized for N/V/D, --- again found to be hypokalemic--- s/p capsule endo suggestive of Crohn's disease/gastroparesis. Treated with IV Zofran, Dilaudid, IV Reglan. 07/19/11: Underwent capsule endoscopy, found small bowel erosions and 1 aphthous ulcer thought to be from mild Crohn's disease. Entocort re-started. Amitriptyline started. Noted to have some issues with confusion which improved on d/c of Phenergan/narcotics. 01/13/15: Hospitalized at [facility]. MRI brain neg. 01/21/16: Hospitalized with cyclic vomiting and GIB. EGD:  Prepyloric ulcer. H. Pylori neg. Once again, told to d/c Indocin. 08/21/16: OV with GI. Reported couldn't afford co-pays on meds prescribed at [facility]. Scheduled for EGD/colonoscopy. Rx: Reglan. 11/28/20: OV GI. Plan to start 6-MP and Humira vs. Cimzia. Testing done for Thiopurine methyltransferase, hep B, TB. Celiac testing neg. 12/01/20: Starter pack for Humira prescribed. "Passed capsule." 02/17/23: Hosp for GIB, hgb 3.2. EGD/Sigmoidoscopy showed esophageal and duodenal ulcers. Repeat EGD 02/22/23 d/t ongoing bleeding. Ongoing NSAID use noted. 02/26/23: Colonoscopy 03/01/23 neg for inflammation.  03/06/23--- EGD with push enteroscopy done on  03/06/23: Friable mucosa, mesenteric ischemia. Discharge on 03/09/2023 -Readmitted on 03/13/2023   -Assessment and Plan: 1)Acute GI bleed -- Hgb up to 7.2 from 6.3 after transfusion of 1 unit of PRBC on 03/13/2023  S/p recent  EGD and flex sig 4/23 and repeat EGD 4/26 due to persistent bleeding Initial EGD from 02/19/2023 showed esophagitis with ulcers as well extensive duodenal ulceration on initial EGD.  Visible vessel treated with epinephrine and hemostasis clip.  She developed recurrent bleeding  - underwent a repeat EGD on 02/22/23 which revealed a visible vessel at a different site in the ulcer crater requiring epinephrine and clip placement.   -H. pylori negative - Limited FS on 02/22/2023 was with  poor prep nondiagnostic. ---Colonoscopy on 03/01/2023 shows no presence of inflammation hematin or active bleeding was present thorughout the colon.  -03/06/23--- EGD with push enteroscopy done on  03/06/23: Friable mucosa, mesenteric ischemia. -CT angio GI bleed from 03/14/2023 shows evidence of duodenitis, without acute intra-abdominal bleeding identified -RBC nuclear tagged scan from 03/14/2023 without active bleeding -c/n IV Protonix and Carafate -GI consult appreciated 03/16/23 -GI bleeding of obscure etiology.  History of small bowel Crohn's.  Extensive duodenal ulcer  disease successfully treated with endoscopic therapy.  Biopsies negative for H. pylori or malignancy. On prednisone 40 mg daily for presumed small bowel Crohn's diseas -Previously on Humira -Discussed with GI attending Dr. Rourk----recommend holding discharge for another 24 hours -Potential discharge on 03/17/2023 if Hgb stable   2)Chronic diastolic CHF (congestive heart failure) (HCC) Stable and compensated.  Last echo 11/2022, EF of 60 to 65%, grade 1 DD.  Not on diuretic. -Monitor closely   3)Diabetes mellitus (HCC) -A1c 6.4 reflecting good diabetic control PTA -Hold Lantus until oral intake is more reliable Use Novolog/Humalog Sliding scale insulin with Accu-Cheks/Fingersticks as ordered    4)HTN Stable, continue metoprolol succinate 37.5 mg daily   5)Crohn's disease of small intestine without complication (HCC) Stable.  Previously treated with Humira currently not on medications for this -Recent biopsy during endoscopy was nondiagnostic --Awaiting further input from GI team to see if patient benefits from IV steroids for presumed colitis flareup -Currently on Entocort -Previously on Humira   6) CKD stage -IV -Creatinine currently 1.7 continue to monitor closely especially due to contrast study -Renally adjust medications, avoid nephrotoxic agents / dehydration  / hypotension   7)Mild Hyponatremia, not POA----suspect dehydration related, monitor closely  8) acute on chronic anemia due to GI bleed--folate and B12 are not low --- Hgb up to 7.2 from 6.3 after transfusion of 1 unit of PRBC on 03/13/2023  9) social/ethics--- palliative care consult appreciated -She is apparently a retired Public house manager -Patient is a full code -Palliative care conference scheduled for Monday 07/19/23 at 9 AM with patient and family members   Status is: Inpatient   Disposition: The patient is from: Home              Anticipated d/c is to: Home              Anticipated d/c date is: 2 days               Patient currently is not medically stable to d/c. Barriers: Not Clinically Stable-   Code Status :  -  Code Status: Full Code   Family Communication:   Husband is primary contact  DVT Prophylaxis  :   - SCDs  SCDs Start: 03/13/23 2212   Lab Results  Component Value Date   PLT 164 03/15/2023    Inpatient Medications  Scheduled Meds:  atorvastatin  80 mg Oral Daily   Chlorhexidine Gluconate Cloth  6 each Topical Daily   feeding supplement (GLUCERNA SHAKE)  237 mL Oral TID BM   insulin aspart  0-9 Units Subcutaneous Q4H   metoprolol succinate  37.5 mg Oral Daily   pantoprazole (PROTONIX) IV  40 mg Intravenous Q12H   predniSONE  40 mg Oral Q breakfast   sodium chloride flush  10-40 mL Intracatheter Q12H   sucralfate  1 g Oral TID WC & HS   Continuous Infusions:  magnesium sulfate bolus IVPB     PRN Meds:.acetaminophen, hydrOXYzine, ondansetron **OR** ondansetron (ZOFRAN) IV, oxyCODONE, prochlorperazine, sodium chloride flush   Anti-infectives (From admission, onward)    None         Subjective: Heather [name] today has no fevers, no emesis,  No chest pain,   - -Persistent concerns about possible melanotic stools Hgb relatively stable around 7  Objective: Vitals:   03/15/23 2104 03/16/23 0358 03/16/23 1318 03/16/23 1515  BP: (!) 101/50 (!) 120/58 (!) 97/32 95/62  Pulse: 100  (!) 108 (!) 107  Resp: 18 18 19    Temp: 98.6 F (37 C) 98.9 F (  37.2 C) 99.5 F (37.5 C) 98.6 F (37 C)  TempSrc: Oral Oral Oral Oral  SpO2: 100% 99% 99% 100%  Weight:      Height:        Intake/Output Summary (Last 24 hours) at 03/16/2023 1647 Last data filed at 03/16/2023 1300 Gross per 24 hour  Intake 442.12 ml  Output 500 ml  Net -57.88 ml   Filed Weights   03/13/23 1650  Weight: 54.4 kg   Physical Exam  Gen:- Awake Alert, No conversational dyspnea HEENT:- .AT, No sclera icterus Neck-Supple Neck,No JVD,.  Lungs-  CTAB , fair symmetrical air movement CV- S1, S2  normal, regular  Abd-  +ve B.Sounds, Abd Soft, No significant tenderness,    Extremity/Skin:-Significant bilateral upper extremity edema, pedal pulses present  Psych-affect is mostly appropriate, oriented x3, occasional episodes of forgetfulness and anxiety Neuro-generalized weakness, no new focal deficits, no tremors MSK-left arm PICC line placed on 03/14/2023  Data Reviewed: I have personally reviewed following labs and imaging studies  CBC: Recent Labs  Lab 03/13/23 1756 03/14/23 0445 03/14/23 1620 03/15/23 0801 03/15/23 1311 03/16/23 0427  WBC 12.3* 7.8  --  9.8  --   --   HGB 6.3* 7.0* 7.1* 7.4* 7.3* 7.2*  HCT 20.1* 21.2* 21.3* 22.9* 22.5* 22.5*  MCV 100.5* 94.2  --  95.4  --   --   PLT 272 146*  --  164  --   --    Basic Metabolic Panel: Recent Labs  Lab 03/13/23 1756 03/14/23 0416  NA 132* 132*  K 4.9 4.7  CL 105 107  CO2 20* 18*  GLUCOSE 311* 291*  BUN 73* 75*  CREATININE 1.72* 1.74*  CALCIUM 7.8* 7.7*  MG  --  1.4*  PHOS  --  3.2   GFR: Estimated Creatinine Clearance: 25.8 mL/min (A) (by C-G formula based on SCr of 1.74 mg/dL (H)). Liver Function Tests: Recent Labs  Lab 03/13/23 1756 03/14/23 0416  AST 33 25  ALT 22 19  ALKPHOS 79 69  BILITOT 0.6 0.6  PROT 4.5* 4.1*  ALBUMIN 1.9* 1.8*   Radiology Studies: No results found.   Scheduled Meds:  atorvastatin  80 mg Oral Daily   Chlorhexidine Gluconate Cloth  6 each Topical Daily   feeding supplement (GLUCERNA SHAKE)  237 mL Oral TID BM   insulin aspart  0-9 Units Subcutaneous Q4H   metoprolol succinate  37.5 mg Oral Daily   pantoprazole (PROTONIX) IV  40 mg Intravenous Q12H   predniSONE  40 mg Oral Q breakfast   sodium chloride flush  10-40 mL Intracatheter Q12H   sucralfate  1 g Oral TID WC & HS   Continuous Infusions:  magnesium sulfate bolus IVPB       LOS: 1 day   Shon Hale M.D on 03/16/2023 at 4:47 PM  Go to [email or web address] - for contact info  [facility] - Office   [phone]  If 7PM-7AM, please contact night-coverage [email or web address] 03/16/2023, 4:47 PM

## 2023-03-16 NOTE — Progress Notes (Signed)
Patient has been NPO since midnight for gastrin study today.  Two prn medications given during this shift. Patient is AO x2. Continues to be monitored by the centralized telemetry department with heart rhythm of sinus tach. Plan of care ongoing.

## 2023-03-17 DIAGNOSIS — K922 Gastrointestinal hemorrhage, unspecified: Secondary | ICD-10-CM | POA: Diagnosis not present

## 2023-03-17 DIAGNOSIS — D62 Acute posthemorrhagic anemia: Secondary | ICD-10-CM | POA: Diagnosis not present

## 2023-03-17 DIAGNOSIS — K921 Melena: Secondary | ICD-10-CM | POA: Diagnosis not present

## 2023-03-17 DIAGNOSIS — K5000 Crohn's disease of small intestine without complications: Secondary | ICD-10-CM | POA: Diagnosis not present

## 2023-03-17 DIAGNOSIS — K2971 Gastritis, unspecified, with bleeding: Secondary | ICD-10-CM | POA: Diagnosis not present

## 2023-03-17 LAB — BPAM RBC

## 2023-03-17 LAB — GLUCOSE, CAPILLARY
Glucose-Capillary: 286 mg/dL — ABNORMAL HIGH (ref 70–99)
Glucose-Capillary: 222 mg/dL — ABNORMAL HIGH (ref 70–99)
Glucose-Capillary: 264 mg/dL — ABNORMAL HIGH (ref 70–99)
Glucose-Capillary: 309 mg/dL — ABNORMAL HIGH (ref 70–99)
Glucose-Capillary: 382 mg/dL — ABNORMAL HIGH (ref 70–99)

## 2023-03-17 LAB — TYPE AND SCREEN
ABO/RH(D): O POS
Antibody Screen: NEGATIVE

## 2023-03-17 LAB — BASIC METABOLIC PANEL
Anion gap: 8 (ref 5–15)
BUN: 71 mg/dL — ABNORMAL HIGH (ref 8–23)
CO2: 17 mmol/L — ABNORMAL LOW (ref 22–32)
Calcium: 7.9 mg/dL — ABNORMAL LOW (ref 8.9–10.3)
Chloride: 107 mmol/L (ref 98–111)
Creatinine, Ser: 1.98 mg/dL — ABNORMAL HIGH (ref 0.44–1.00)
GFR, Estimated: 27 mL/min — ABNORMAL LOW (ref >60)
Glucose, Bld: 279 mg/dL — ABNORMAL HIGH (ref 70–99)
Potassium: 4.1 mmol/L (ref 3.5–5.1)
Sodium: 132 mmol/L — ABNORMAL LOW (ref 135–145)

## 2023-03-17 LAB — CBC
HCT: 21.4 % — ABNORMAL LOW (ref 36.0–46.0)
Hemoglobin: 7 g/dL — ABNORMAL LOW (ref 12.0–15.0)
MCH: 31.3 pg (ref 26.0–34.0)
MCHC: 32.7 g/dL (ref 30.0–36.0)
MCV: 95.5 fL (ref 80.0–100.0)
Platelets: 141 10*3/uL — ABNORMAL LOW (ref 150–400)
RBC: 2.24 MIL/uL — ABNORMAL LOW (ref 3.87–5.11)
RDW: 17.1 % — ABNORMAL HIGH (ref 11.5–15.5)
WBC: 12 10*3/uL — ABNORMAL HIGH (ref 4.0–10.5)
nRBC: 0 % (ref 0.0–0.2)

## 2023-03-17 LAB — PREPARE RBC (CROSSMATCH)

## 2023-03-17 LAB — GASTRIN: Gastrin: 244 pg/mL — ABNORMAL HIGH (ref 0–115)

## 2023-03-17 MED ORDER — LEVETIRACETAM 500 MG PO TABS
500.0000 mg | ORAL_TABLET | Freq: Two times a day (BID) | ORAL | Status: DC
  Administered 2023-03-17 – 2023-03-18 (×4): 500 mg via ORAL
  Filled 2023-03-17 (×5): qty 1

## 2023-03-17 MED ORDER — VENLAFAXINE HCL ER 75 MG PO CP24
150.0000 mg | ORAL_CAPSULE | Freq: Every day | ORAL | Status: DC
  Administered 2023-03-18: 150 mg via ORAL
  Filled 2023-03-17 (×2): qty 2

## 2023-03-17 MED ORDER — SODIUM CHLORIDE 0.9% IV SOLUTION: Freq: Once | INTRAVENOUS | Status: AC

## 2023-03-17 MED ORDER — FUROSEMIDE 10 MG/ML IJ SOLN
20.0000 mg | Freq: Once | INTRAMUSCULAR | Status: AC
  Administered 2023-03-17: 20 mg via INTRAVENOUS
  Filled 2023-03-17: qty 2

## 2023-03-17 MED ORDER — ALLOPURINOL 100 MG PO TABS
100.0000 mg | ORAL_TABLET | Freq: Every day | ORAL | Status: DC
  Administered 2023-03-17 – 2023-03-18 (×2): 100 mg via ORAL
  Filled 2023-03-17 (×3): qty 1

## 2023-03-17 NOTE — Progress Notes (Addendum)
Patient alert conversant.  Enjoying her regular diet.  Wants to go home.  Hemoglobin 7.0 this morning/7.1 on 5/16/  6.3 on admission for which she has received 1 unit of packed RBCs on admission.  Receiving 1 more unit of packed RBCs today  Vital signs in last 24 hours: Temp:  [97.9 F (36.6 C)-99.5 F (37.5 C)] 97.9 F (36.6 C) (05/19 0409) Pulse Rate:  [100-108] 100 (05/19 0409) Resp:  [12-19] 18 (05/19 0409) BP: (95-164)/(32-82) 162/82 (05/19 0409) SpO2:  [99 %-100 %] 100 % (05/19 0409) Last BM Date : 03/15/23 General:   Alert,  pleasant and cooperative in NAD Abdomen: Nondistended.  Soft and nontender.    Intake/Output from previous day: 05/18 0701 - 05/19 0700 In: 240 [P.O.:240] Out: 800 [Urine:800] Intake/Output this shift: No intake/output data recorded.   Impression: 72 year old lady with history of recurrent overt GI bleeding of obscure etiology.  History of small bowel Crohn's.  Extensive duodenal ulcer disease successfully treated with endoscopic therapy.  Biopsies negative for H. pylori or malignancy. On prednisone 40 mg daily for presumed small bowel Crohn's disease. Twice daily PPI therapy needed for at least 12 weeks. Hemoglobin relatively stable since initial transfusion on admission. Discussed with Dr. Marisa Severin yesterday   Recommendations:   Regular diet   Follow H&H; as needed transfusion.  Agree with 1 more unit prior to discharge.   Follow-up on fasting gastrin to complete GI evaluation.   Continue twice daily PPI.  Continue prednisone 40 mg daily at discharge.   Since [facility] no longer claims her as a patient, we will arrange to see her back in the office in about 3 to 4 weeks.  Anticipate discharge within the next 24 hours.

## 2023-03-17 NOTE — Progress Notes (Signed)
PROGRESS NOTE  Heather [name], is a 72 y.o. female, DOB - 1951/10/22, ZOX:[id number]  Admit date - 03/13/2023   Admitting Physician [name], MD  Outpatient Primary MD for the patient is [name], MD  LOS - 2  Chief Complaint  Patient presents with   Rectal Bleeding       Brief Narrative:  72 y.o. female with medical history significant for recurrent GI bleed, CKD 4, CVA, dCHF,  DM2, HTN, history of prior stroke with history of peptic ulcer disease with recent bleeding from duodenal ulcer,, small bowel Crohn's disease admitted on 03/13/2023 with  concerns about melena/rectal bleeding and hemoglobin down to 6.3  Dates/Pertinent events: 04/13/11: Presents for OV with Dr. [name] Payor with multiple GI complaints. EGD/Colonoscopy scheduled. 05/18/11: Gastric emptying study, 61% retention (abnormal). 05/29/11: EGD/Colonoscopy: Terminal ileal erosions seen and prescribed Entocort for possible Crohn's disease. Pathology showed IBD/erosisions. Unk date: Hosp at [facility] for abd pain, N/V. Treated with IV Solumedrol with improvement. Thought terminal ileal erosions may be due to NSAIDs. ESR 47. Reglan prescribed. 07/05/11: F/U OV with Dr. [name] Payor. No benefit from Entocort or Reglan, still reports bloating and vomiting despite small intake. Noted to have iron deficiency/B12 deficiency anemia. Reglan & Entocort d/c'd. Advised to avoid NSAIDs (Indomethacin). 07/17/11: Hospitalized for N/V/D, --- again found to be hypokalemic--- s/p capsule endo suggestive of Crohn's disease/gastroparesis. Treated with IV Zofran, Dilaudid, IV Reglan. 07/19/11: Underwent capsule endoscopy, found small bowel erosions and 1 aphthous ulcer thought to be from mild Crohn's disease. Entocort re-started. Amitriptyline started. Noted to have some issues with confusion which improved on d/c of Phenergan/narcotics. 01/13/15: Hospitalized at [facility]. MRI brain neg. 01/21/16: Hospitalized with cyclic vomiting and GIB. EGD:  Prepyloric ulcer. H. Pylori neg. Once again, told to d/c Indocin. 08/21/16: OV with GI. Reported couldn't afford co-pays on meds prescribed at [facility]. Scheduled for EGD/colonoscopy. Rx: Reglan. 11/28/20: OV GI. Plan to start 6-MP and Humira vs. Cimzia. Testing done for Thiopurine methyltransferase, hep B, TB. Celiac testing neg. 12/01/20: Starter pack for Humira prescribed. "Passed capsule." 02/17/23: Hosp for GIB, hgb 3.2. EGD/Sigmoidoscopy showed esophageal and duodenal ulcers. Repeat EGD 02/22/23 d/t ongoing bleeding. Ongoing NSAID use noted. 02/26/23: Colonoscopy 03/01/23 neg for inflammation.  03/06/23--- EGD with push enteroscopy done on  03/06/23: Friable mucosa, mesenteric ischemia. Discharge on 03/09/2023 -Readmitted on 03/13/2023   -Assessment and Plan: 1)Acute GI bleed -- Hgb up to 7.2 from 6.3 after transfusion of 1 unit of PRBC on 03/13/2023  S/p recent  EGD and flex sig 4/23 and repeat EGD 4/26 due to persistent bleeding Initial EGD from 02/19/2023 showed esophagitis with ulcers as well extensive duodenal ulceration on initial EGD.  Visible vessel treated with epinephrine and hemostasis clip.  She developed recurrent bleeding  - underwent a repeat EGD on 02/22/23 which revealed a visible vessel at a different site in the ulcer crater requiring epinephrine and clip placement.   -H. pylori negative - Limited FS on 02/22/2023 was with  poor prep nondiagnostic. ---Colonoscopy on 03/01/2023 shows no presence of inflammation hematin or active bleeding was present thorughout the colon.  -03/06/23--- EGD with push enteroscopy done on  03/06/23: Friable mucosa, mesenteric ischemia. -CT angio GI bleed from 03/14/2023 shows evidence of duodenitis, without acute intra-abdominal bleeding identified -RBC nuclear tagged scan from 03/14/2023 without active bleeding -c/n IV Protonix and Carafate -GI consult appreciated 03/17/23 -GI bleeding of obscure etiology.  History of small bowel Crohn's.  Extensive duodenal ulcer  disease successfully treated with endoscopic therapy.  Biopsies negative for H. pylori or malignancy. On prednisone 40 mg daily for presumed small bowel Crohn's diseas -Previously on Humira -Hgb is dropping down---transfuse 1 unit of PRBC on 03/17/2023 for total of 2 units this admission -Discussed with GI attending Dr. Rourk----recommend holding discharge for another 24 hours -Potential discharge on 03/18/2023 if Hgb stable   2)Chronic diastolic CHF (congestive heart failure) (HCC) Stable and compensated.  Last echo 11/2022, EF of 60 to 65%, grade 1 DD.  Not on diuretic. -Monitor closely   3)Diabetes mellitus (HCC) -A1c 6.4 reflecting good diabetic control PTA -Hold Lantus until oral intake is more reliable Use Novolog/Humalog Sliding scale insulin with Accu-Cheks/Fingersticks as ordered    4)HTN Stable, continue metoprolol succinate 37.5 mg daily   5)Crohn's disease of small intestine without complication (HCC) Stable.  Previously treated with Humira currently not on medications for this -Recent biopsy during endoscopy was nondiagnostic --Awaiting further input from GI team to see if patient benefits from IV steroids for presumed colitis flareup -Currently on Entocort -Previously on Humira   6) CKD stage -IV -Creatinine currently 1.9  -continue to monitor closely especially due to contrast study -Renally adjust medications, avoid nephrotoxic agents / dehydration  / hypotension   7)Mild Hyponatremia, not POA----suspect dehydration related, monitor closely  8) acute on chronic anemia due to GI bleed--folate and B12 are not low --Hgb is dropping down---transfuse 1 unit of PRBC on 03/17/2023 for total of 2 units this admissio  9) social/ethics--- 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 Barriers: Not Clinically Stable-   Code Status :  -  Code Status: Full Code   Family Communication:   Husband is primary contact  DVT Prophylaxis  :   - SCDs  SCDs Start: 03/13/23 2212   Lab Results  Component Value Date   PLT 141 (L) 03/17/2023    Inpatient Medications  Scheduled Meds:  allopurinol  100 mg Oral Daily   atorvastatin  80 mg Oral Daily   Chlorhexidine Gluconate Cloth  6 each Topical Daily   feeding supplement (GLUCERNA SHAKE)  237 mL Oral TID BM   insulin aspart  0-9 Units Subcutaneous Q4H   levETIRAcetam  500 mg Oral BID   metoprolol succinate  37.5 mg Oral Daily   pantoprazole (PROTONIX) IV  40 mg Intravenous Q12H   predniSONE  40 mg Oral Q breakfast   sodium chloride flush  10-40 mL Intracatheter Q12H   sucralfate  1 g Oral TID WC & HS   [START ON 03/18/2023] venlafaxine XR  150 mg Oral Q breakfast   Continuous Infusions:  magnesium sulfate bolus IVPB     PRN Meds:.acetaminophen, hydrOXYzine, ondansetron **OR** ondansetron (ZOFRAN) IV, oxyCODONE, prochlorperazine, sodium chloride flush   Anti-infectives (From admission, onward)    None        Subjective: [name] today has no fevers, no emesis,  No chest pain,   - -Hgb is dropping down---transfuse 1 unit of  PRBC on 03/17/2023 for total of 2 units this admission -Oral intake is fair  Objective: Vitals:   03/17/23 1142 03/17/23 1157 03/17/23 1403 03/17/23 1456  BP: 134/64 102/72 107/63 101/65  Pulse: (!) 103  99 95  Resp: 15 16 14 16   Temp: 97.8 F (36.6 C) 97.9 F (36.6 C) 98 F (36.7 C) 98.1 F (36.7 C)  TempSrc: Oral Oral Oral Oral  SpO2: 100% 100% 100% 98%  Weight:      Height:        Intake/Output Summary (Last 24 hours) at 03/17/2023 1839 Last data filed at 03/17/2023 1403 Gross per 24 hour  Intake  634.75 ml  Output 800 ml  Net -165.25 ml   Filed Weights   03/13/23 1650  Weight: 54.4 kg   Physical Exam  Gen:- Awake Alert, No conversational dyspnea HEENT:- Thor.AT, No sclera icterus Neck-Supple Neck,No JVD,.  Lungs-  CTAB , fair symmetrical air movement CV- S1, S2 normal, regular  Abd-  +ve B.Sounds, Abd Soft, No significant tenderness,    Extremity/Skin:-Significant bilateral upper extremity edema, pedal pulses present  Psych-affect is mostly appropriate, oriented x3, occasional episodes of forgetfulness and anxiety Neuro-generalized weakness, no new focal deficits, no tremors MSK-left arm PICC line placed on 03/14/2023 Rectal Tube -with melanotic stool  Data Reviewed: I have personally reviewed following labs and imaging studies  CBC: Recent Labs  Lab 03/13/23 1756 03/14/23 0445 03/14/23 1620 03/15/23 0801 03/15/23 1311 03/16/23 0427 03/17/23 0410  WBC 12.3* 7.8  --  9.8  --   --  12.0*  HGB 6.3* 7.0* 7.1* 7.4* 7.3* 7.2* 7.0*  HCT 20.1* 21.2* 21.3* 22.9* 22.5* 22.5* 21.4*  MCV 100.5* 94.2  --  95.4  --   --  95.5  PLT 272 146*  --  164  --   --  141*   Basic Metabolic Panel: Recent Labs  Lab 03/13/23 1756 03/14/23 0416 03/17/23 0410  NA 132* 132* 132*  K 4.9 4.7 4.1  CL 105 107 107  CO2 20* 18* 17*  GLUCOSE 311* 291* 279*  BUN 73* 75* 71*  CREATININE 1.72* 1.74* 1.98*  CALCIUM 7.8* 7.7* 7.9*  MG  --  1.4*  --   PHOS  --  3.2  --    GFR: Estimated Creatinine Clearance: 22.7 mL/min (A) (by C-G formula based on SCr of 1.98 mg/dL (H)). Liver Function Tests: Recent Labs  Lab 03/13/23 1756 03/14/23 0416  AST 33 25  ALT 22 19  ALKPHOS 79 69  BILITOT 0.6 0.6  PROT 4.5* 4.1*  ALBUMIN 1.9* 1.8*   Radiology Studies: No results found.   Scheduled Meds:  allopurinol  100 mg Oral Daily   atorvastatin  80 mg Oral Daily   Chlorhexidine Gluconate Cloth  6 each Topical Daily   feeding supplement (GLUCERNA SHAKE)  237 mL Oral TID BM   insulin aspart   0-9 Units Subcutaneous Q4H   levETIRAcetam  500 mg Oral BID   metoprolol succinate  37.5 mg Oral Daily   pantoprazole (PROTONIX) IV  40 mg Intravenous Q12H   predniSONE  40 mg Oral Q breakfast   sodium chloride flush  10-40 mL Intracatheter Q12H   sucralfate  1 g Oral TID WC & HS   [START ON 03/18/2023] venlafaxine XR  150 mg Oral Q breakfast   Continuous Infusions:  magnesium sulfate bolus IVPB       LOS: 2 days   Shon Hale M.D on 03/17/2023 at 6:39 PM  Go to [email or web address] - for contact info  [facility] - Office  [phone]  If 7PM-7AM, please contact night-coverage [email or web address] 03/17/2023, 6:39 PM

## 2023-03-17 NOTE — Progress Notes (Signed)
PT Screen Patient Details Name: Heather [name] MRN: [id number] DOB: 11/16/1950   Cancelled Treatment:    Reason Eval/Treat Not Completed: Patient declined, no reason specified;PT screened, no needs identified, will sign off    03/17/23 0859  PT Visit Information  Last PT Received On 03/17/23  Reason Eval/Treat Not Completed Patient declined, no reason specified;PT screened, no needs identified, will sign off  History of Present Illness 72 y.o. female with medical history significant for recurrent GI bleed, CKD 4, CVA, dCHF,  DM2, HTN, history of prior stroke with history of peptic ulcer disease with recent bleeding from duodenal ulcer,, small bowel Crohn's disease admitted on 03/13/2023 with  concerns about melena/rectal bleeding and hemoglobin down to 6.3. Pt administered transfusions and hemoglobin back up to 7.4   Arrived with pt sitting in bed with Hob elevated. PT educated on purpose of visit, pt reporting she would accept questions but not participate in any mobility. When PT asked pt why, she reported not wanting to and not having walked in sometime.   Pt confirmed PLOF with husband assisting with transfers to/from [facility] for mobility. Husband assisting with ADLs and IADLs. Pt reporting no decline in her functional status since being admitted and reports that her husband can continue to care for level of need. Pt reporting she feels safe to go home. Pt reporting she feels that she does not need physical therapy services at this time. Based upon this response and current PLOF. PT to sign off on orders as no new acute PT services are indicated at this time due to current WC level mobility and assist provided by +1.   Nelida Meuse 03/17/2023, 9:00 AM

## 2023-03-18 ENCOUNTER — Telehealth: Payer: Self-pay | Admitting: Gastroenterology

## 2023-03-18 DIAGNOSIS — Z7189 Other specified counseling: Secondary | ICD-10-CM | POA: Diagnosis not present

## 2023-03-18 DIAGNOSIS — Z515 Encounter for palliative care: Secondary | ICD-10-CM

## 2023-03-18 DIAGNOSIS — D62 Acute posthemorrhagic anemia: Secondary | ICD-10-CM | POA: Diagnosis not present

## 2023-03-18 DIAGNOSIS — K254 Chronic or unspecified gastric ulcer with hemorrhage: Secondary | ICD-10-CM | POA: Diagnosis not present

## 2023-03-18 DIAGNOSIS — K922 Gastrointestinal hemorrhage, unspecified: Secondary | ICD-10-CM | POA: Diagnosis not present

## 2023-03-18 DIAGNOSIS — K5000 Crohn's disease of small intestine without complications: Secondary | ICD-10-CM | POA: Diagnosis not present

## 2023-03-18 LAB — RENAL FUNCTION PANEL
Albumin: 1.7 g/dL — ABNORMAL LOW (ref 3.5–5.0)
Anion gap: 7 (ref 5–15)
BUN: 71 mg/dL — ABNORMAL HIGH (ref 8–23)
CO2: 17 mmol/L — ABNORMAL LOW (ref 22–32)
Calcium: 7.8 mg/dL — ABNORMAL LOW (ref 8.9–10.3)
Chloride: 108 mmol/L (ref 98–111)
Creatinine, Ser: 2.03 mg/dL — ABNORMAL HIGH (ref 0.44–1.00)
GFR, Estimated: 26 mL/min — ABNORMAL LOW (ref >60)
Glucose, Bld: 259 mg/dL — ABNORMAL HIGH (ref 70–99)
Phosphorus: 2.5 mg/dL (ref 2.5–4.6)
Potassium: 4.1 mmol/L (ref 3.5–5.1)
Sodium: 132 mmol/L — ABNORMAL LOW (ref 135–145)

## 2023-03-18 LAB — GLUCOSE, CAPILLARY
Glucose-Capillary: 196 mg/dL — ABNORMAL HIGH (ref 70–99)
Glucose-Capillary: 211 mg/dL — ABNORMAL HIGH (ref 70–99)
Glucose-Capillary: 240 mg/dL — ABNORMAL HIGH (ref 70–99)
Glucose-Capillary: 267 mg/dL — ABNORMAL HIGH (ref 70–99)
Glucose-Capillary: 328 mg/dL — ABNORMAL HIGH (ref 70–99)
Glucose-Capillary: 344 mg/dL — ABNORMAL HIGH (ref 70–99)
Glucose-Capillary: 400 mg/dL — ABNORMAL HIGH (ref 70–99)

## 2023-03-18 LAB — TYPE AND SCREEN: Unit division: 0

## 2023-03-18 LAB — CBC
HCT: 28.2 % — ABNORMAL LOW (ref 36.0–46.0)
Hemoglobin: 9.4 g/dL — ABNORMAL LOW (ref 12.0–15.0)
MCH: 31 pg (ref 26.0–34.0)
MCHC: 33.3 g/dL (ref 30.0–36.0)
MCV: 93.1 fL (ref 80.0–100.0)
Platelets: 145 10*3/uL — ABNORMAL LOW (ref 150–400)
RBC: 3.03 MIL/uL — ABNORMAL LOW (ref 3.87–5.11)
RDW: 16.3 % — ABNORMAL HIGH (ref 11.5–15.5)
WBC: 14.3 10*3/uL — ABNORMAL HIGH (ref 4.0–10.5)
nRBC: 0 % (ref 0.0–0.2)

## 2023-03-18 LAB — BPAM RBC
Blood Product Expiration Date: 202406212359
ISSUE DATE / TIME: 202405191133
Unit Type and Rh: 5100

## 2023-03-18 MED ORDER — ACETAMINOPHEN 325 MG PO TABS: 650.0000 mg | ORAL_TABLET | Freq: Four times a day (QID) | ORAL | 2 refills | Status: DC | PRN

## 2023-03-18 MED ORDER — INSULIN ASPART 100 UNIT/ML FLEXPEN: 2.0000 [IU] | PEN_INJECTOR | Freq: Three times a day (TID) | SUBCUTANEOUS | 1 refills | Status: DC

## 2023-03-18 MED ORDER — OXYCODONE HCL 5 MG PO TABS: 5.0000 mg | ORAL_TABLET | Freq: Two times a day (BID) | ORAL | 0 refills | Status: DC | PRN

## 2023-03-18 MED ORDER — ALPRAZOLAM 0.5 MG PO TABS: 0.5000 mg | ORAL_TABLET | Freq: Three times a day (TID) | ORAL | 0 refills | Status: DC | PRN

## 2023-03-18 MED ORDER — SODIUM BICARBONATE 650 MG PO TABS: 650.0000 mg | ORAL_TABLET | Freq: Two times a day (BID) | ORAL | 2 refills | Status: DC

## 2023-03-18 MED ORDER — SUCRALFATE 1 GM/10ML PO SUSP: 1.0000 g | Freq: Three times a day (TID) | ORAL | 1 refills | Status: DC

## 2023-03-18 MED ORDER — PANTOPRAZOLE SODIUM 40 MG PO TBEC: 40.0000 mg | DELAYED_RELEASE_TABLET | Freq: Two times a day (BID) | ORAL | 5 refills | Status: DC

## 2023-03-18 MED ORDER — PREDNISONE 20 MG PO TABS: 40.0000 mg | ORAL_TABLET | Freq: Every day | ORAL | 0 refills | Status: DC

## 2023-03-18 NOTE — Discharge Instructions (Signed)
1)Novolog Insulin-----per Sliding Scale---Give only if eats 50% or more of meal. insulin aspart (novoLOG) injection 0-5 Units  0-5 Units Subcutaneous, 3 times daily with meals  CBG 70 - 120: 0 unit CBG 121 - 150: 0 unit  CBG 151 - 200: 1 unit  CBG 201 - 250: 2 units  CBG 251 - 300: 4 units  CBG > 300: 5 units  2)Avoid ibuprofen/Advil/Aleve/Motrin/Goody Powders/Naproxen/BC powders/Meloxicam/Diclofenac/Indomethacin and other Nonsteroidal anti-inflammatory medications as these will make you more likely to bleed and can cause stomach ulcers, can also cause Kidney problems.   3)Repeat CBC and CMP Blood Tests Every Thursday for 4 weeks  starting on 03/21/23   4)Hospice Team will continue to help you once you  get home with overall care plan  5)Follow-up Gastroenterologist Dr. Earnest Bailey with [facility]--- in 2 to 3 weeks for evaluation and possible prednisone taper -address: [address], [place], [place] [address], Phone: [phone]

## 2023-03-18 NOTE — Telephone Encounter (Signed)
Opened in error

## 2023-03-18 NOTE — Progress Notes (Signed)
Palliative: Mrs. Bratten is lying quietly in bed.  She appears acutely/chronically ill and quite frail.  Older than stated age.  She will make an somewhat keep eye contact.  She is alert and oriented x 3, able to make her needs known.  She is surrounded by her loving family including her husband [name], her grand sons Josh ([place]) and [place] (unnamed) with Matt's 2 children.  We talk in detail about Ms. Ganim's acute on chronic health concerns including, but not limited to, 7 hospital stays in the last 6 months, 3 in the last 2 months for GI issues; GI bleeding and testing/recommendations; selected labs including, but not limited to hemoglobin and in particular albumin.   We talk about the benefits of going home with hospice care.  We talk in generalities about the benefits of hospice care.  Provider choice offered.  Family would like [facility] hospice care from [place].    We talk about disposition.  Although family feels that Mrs. Mccrea would be best served with short-term rehab, she has not qualified due to her participation and long-term weakness.  Mrs. Service states that she would rather pass away than to live in a nursing home long-term.  We talk about rehospitalization.  At this point Mrs. Lookabill is unsure, but family states that they would not bring her back to the hospital.   Mrs. Daigneault states that she would like to return to her home today.  They shared that they would like ambulance transport home.  No equipment needs at this time.  We talk about CODE STATUS.  Mrs. Delreal states that at this point she would like to remain full scope/full code.  She and family do set limits on no more than 7 to 10 days, no tracheotomy.  I share that this may change as her condition changes.  Family states agreement.  Conference with attending, GI service, bedside nursing staff, transition of care team related to patient condition, needs, goals of care, disposition.  Plan: At this point continue to  treat the treatable.  Home with the benefits of [facility] hosp[facility].  Anticipate do not rehospitalize.  Anticipate DNR within the next month.  70 minutes, extended time Lillia Carmel, NP Palliative medicine team Team phone [phone] Greater than 50% of this time was spent counseling and coordinating care related to the above assessment and plan.

## 2023-03-18 NOTE — Progress Notes (Signed)
Chart reviewed. Hgb improved to 9.4, previously 7 yesterday. Received additional unit of blood yesterday. 3 units PRBCs total this admission. Gastrin elevated at 244 although this is in setting of PPI therapy. Recommend rechecking as outpatient.   GI signing off. We are currently in process of arranging outpatient visit. Continue prednisone 40 mg daily. BID PPI therapy for at least 12 weeks. Regular diet.

## 2023-03-18 NOTE — Progress Notes (Signed)
[employer] engineer, [employer] [facility]) Hospital Liaison Note  Referral received for patient/family interest in hospice at home.   Plan is to discharge home today via private car.   No current DME needs at this time.   Please send comfort medications/prescriptions with patient at discharge.   Please call with any questions or concerns. Thank you  Dionicio Stall, [facility] Liaison [phone]

## 2023-03-18 NOTE — TOC Transition Note (Signed)
Transition of Care [facility]) - CM/SW Discharge Note   Patient Details  Name: Heather [name] MRN: [id number] Date of Birth: 12/17/1950  Transition of Care [facility]) CM/SW Contact:  Leitha Bleak, RN Phone Number: 03/18/2023, 10:46 AM   Clinical Narrative:   Palliative consulted TOC for home with [facility] hosp[facility]. Referral sent, they will contact her husband to set up a time to meet them at home. Patient will need EMS to get home. Medical necessity printed. RN will call EMS when patient is ready to discharge home.     Final next level of care: Home w Hospice Care Barriers to Discharge: Barriers Resolved   Patient Goals and CMS Choice    Discharge Placement          Patient to be transferred to facility by: EMS Name of family member notified: Husband Patient and family notified of of transfer: 03/18/23  Discharge Plan and Services Additional resources added to the After Visit Summary for         Social Determinants of Health (SDOH) Interventions SDOH Screenings   Food Insecurity: No Food Insecurity (03/14/2023)  Housing: Patient Declined (03/14/2023)  Transportation Needs: No Transportation Needs (03/14/2023)  Utilities: Not At Risk (03/14/2023)  Tobacco Use: Low Risk  (03/14/2023)     Readmission Risk Interventions    03/01/2023    1:24 PM 02/06/2023    8:28 AM 11/14/2022   11:31 AM  Readmission Risk Prevention Plan  Transportation Screening Complete Complete   Medication Review [employer]) Complete Complete   PCP or Specialist appointment within 3-5 days of discharge   Complete  HRI or Home Care Consult Complete Complete   SW Recovery Care/Counseling Consult Complete Complete   Palliative Care Screening Not Applicable Not Applicable   Skilled Nursing Facility Not Applicable Not Applicable

## 2023-03-18 NOTE — Discharge Summary (Signed)
Heather [name], is a 72 y.o. female  DOB 1951-04-09  MRN [id number].  Admission date:  03/13/2023  Admitting Physician  Shon Hale, MD  Discharge Date:  03/18/2023   Primary MD  [name], MD  Recommendations for primary care physician for things to follow:   1)Novolog Insulin-----per Sliding Scale---Give only if eats 50% or more of meal. insulin aspart (novoLOG) injection 0-5 Units  0-5 Units Subcutaneous, 3 times daily with meals  CBG 70 - 120: 0 unit CBG 121 - 150: 0 unit  CBG 151 - 200: 1 unit  CBG 201 - 250: 2 units  CBG 251 - 300: 4 units  CBG > 300: 5 units  2)Avoid ibuprofen/Advil/Aleve/Motrin/Goody Powders/Naproxen/BC powders/Meloxicam/Diclofenac/Indomethacin and other Nonsteroidal anti-inflammatory medications as these will make you more likely to bleed and can cause stomach ulcers, can also cause Kidney problems.   3)Repeat CBC and CMP Blood Tests Every Thursday for 4 weeks  starting on 03/21/23   4)Hospice Team will continue to help you once you  get home with overall care plan  5)Follow-up Gastroenterologist Dr. Earnest Bailey with [facility]--- in 2 to 3 weeks for evaluation and possible prednisone taper -address: [address] [place], [place] [address], Phone: [phone]   Admission Diagnosis  GI bleed [K92.2] Acute GI bleeding [K92.2]  Discharge Diagnosis  GI bleed [K92.2] Acute GI bleeding [K92.2]    Principal Problem:   GI bleed Active Problems:   ABLA (acute blood loss anemia)   Chronic diastolic CHF (congestive heart failure) (HCC)   Hypoalbuminemia due to protein-calorie malnutrition (HCC)   ? Crohn's disease   Mixed hyperlipidemia   Melena   Type 2 diabetes mellitus with hyperglycemia (HCC)   Acute GI bleeding   Elevated MCV   Left leg swelling      Past Medical History:  Diagnosis Date   Acute renal failure superimposed on  stage 3 chronic kidney disease (HCC) 01/21/2016   Anemia    Anxiety    B12 deficiency    Brain tumor (benign) (HCC) 10/29/2020   Cellulitis and abscess    Abdomen and buttocks   Chronic diastolic CHF (congestive heart failure) (HCC) 03/16/2022   CKD (chronic kidney disease) stage 3, GFR 30-59 ml/min (HCC)    Closed fracture of distal end of femur, unspecified fracture morphology, initial encounter (HCC)    Coronary arteriosclerosis 07/15/2018   Crohn disease (HCC)    Depression    Diabetic neuropathy (HCC)    feet   DJD (degenerative joint disease)    Right forminal stenosis C4-5   Essential hypertension    Femur fracture, right (HCC) 03/30/2017   Folliculitis    GERD (gastroesophageal reflux disease)    Gout    Headache(784.0)    Hiatal hernia    History of blood transfusion    History of bronchitis    History of cardiac catheterization    No significant CAD 2012   History of kidney stones    surgery to remove   Insomnia    Irritable  bowel syndrome    Mixed hyperlipidemia    Nausea & vomiting 12/22/2014   Osteoarthritis    Palpitations    Pneumonia    Reflux esophagitis    Salmonella    Stroke [facility])    Tinnitus    Right   Type 2 diabetes mellitus [facility])     Past Surgical History:  Procedure Laterality Date   BACK SURGERY     BIOPSY  02/19/2023   Procedure: BIOPSY;  Surgeon: Dolores Frame, MD;  [facility];  Service: Gastroenterology;;   c-spine surgery     03/2007   CARDIAC CATHETERIZATION     CHOLECYSTECTOMY     COLONOSCOPY  04/21/2011; 05/29/11   6/12: [place] - ?AVM at IC valve, inflammatory changes at IC valve [facility]); 7/12 - [place]; IC valve erosions, look chronic and probably Crohn's per path   colonoscopy  2017   [place]: random biopsies normal. TI normal   COLONOSCOPY WITH PROPOFOL N/A 03/01/2023   Procedure: COLONOSCOPY WITH PROPOFOL;  Surgeon: Dolores Frame, MD;  [facility];  Service:  Gastroenterology;  Laterality: N/A;  ileocolonoscopy   ENTEROSCOPY N/A 03/06/2023   Procedure: ENTEROSCOPY;  Surgeon: Corbin Ade, MD;  [facility];  Service: Endoscopy;  Laterality: N/A;   ESOPHAGOGASTRODUODENOSCOPY  05/29/11   normal   ESOPHAGOGASTRODUODENOSCOPY N/A 01/21/2016   Dr. Bosie Clos: normal   ESOPHAGOGASTRODUODENOSCOPY (EGD) WITH PROPOFOL N/A 02/19/2023   Procedure: ESOPHAGOGASTRODUODENOSCOPY (EGD) WITH PROPOFOL;  Surgeon: Dolores Frame, MD;  [facility];  Service: Gastroenterology;  Laterality: N/A;   ESOPHAGOGASTRODUODENOSCOPY (EGD) WITH PROPOFOL N/A 02/22/2023   Procedure: ESOPHAGOGASTRODUODENOSCOPY (EGD) WITH PROPOFOL;  Surgeon: Corbin Ade, MD;  [facility];  Service: Endoscopy;  Laterality: N/A;   ESOPHAGOGASTRODUODENOSCOPY (EGD) WITH PROPOFOL N/A 02/28/2023   Procedure: ESOPHAGOGASTRODUODENOSCOPY (EGD) WITH PROPOFOL;  Surgeon: Corbin Ade, MD;  [facility];  Service: Endoscopy;  Laterality: N/A;   EYE SURGERY Bilateral    cataracts removed   FEMUR IM NAIL Right 04/10/2018   Procedure: INTRAMEDULLARY (IM) NAIL FEMORAL;  Surgeon: Durene Romans, MD;  Location: [facility];  Service: Orthopedics;  Laterality: Right;   FLEXIBLE SIGMOIDOSCOPY N/A 02/19/2023   Procedure: FLEXIBLE SIGMOIDOSCOPY;  Surgeon: Dolores Frame, MD;  [facility];  Service: Gastroenterology;  Laterality: N/A;   FRACTURE SURGERY     GIVENS CAPSULE STUDY  11/17/2020   GIVENS CAPSULE STUDY N/A 03/04/2023   Procedure: GIVENS CAPSULE STUDY;  Surgeon: Lanelle Bal, DO;  [facility];  Service: Endoscopy;  Laterality: N/A;   HARDWARE REMOVAL Right 04/10/2018   Procedure: HARDWARE REMOVAL RIGHT DISTAL FEMUR;  Surgeon: Durene Romans, MD;  Location: [facility];  Service: Orthopedics;  Laterality: Right;   HEMOSTASIS CLIP PLACEMENT  02/19/2023   Procedure: HEMOSTASIS CLIP PLACEMENT;  Surgeon: Dolores Frame, MD;   [facility];  Service: Gastroenterology;;   HEMOSTASIS CLIP PLACEMENT  02/22/2023   Procedure: HEMOSTASIS CLIP PLACEMENT;  Surgeon: Corbin Ade, MD;  [facility];  Service: Endoscopy;;  Resolution clip free floating, Mantis clip intact with mucosa   HEMOSTASIS CONTROL  02/19/2023   Procedure: HEMOSTASIS CONTROL;  Surgeon: Dolores Frame, MD;  [facility];  Service: Gastroenterology;;   HERNIA REPAIR     IR FLUORO GUIDE CV LINE RIGHT  04/01/2017   IR US GUIDE VASC ACCESS RIGHT  04/01/2017   JOINT REPLACEMENT Right    hip  OPEN REDUCTION INTERNAL FIXATION (ORIF) DISTAL RADIAL FRACTURE Right 05/28/2019   Procedure: OPEN REDUCTION INTERNAL FIXATION (ORIF) DISTAL RADIAL FRACTURE;  Surgeon: Ernest Mallick, MD;  Location: [facility];  Service: Orthopedics;  Laterality: Right;  with block 90 minutes   ORIF FEMUR FRACTURE Right 03/31/2017   Procedure: OPEN REDUCTION INTERNAL FIXATION (ORIF) DISTAL FEMUR FRACTURE;  Surgeon: Durene Romans, MD;  [facility];  Service: Orthopedics;  Laterality: Right;   ORIF FEMUR FRACTURE Right 12/14/2022   Procedure: OPEN REDUCTION INTERNAL FIXATION (ORIF) DISTAL FEMUR FRACTURE;  Surgeon: Roby Lofts, MD;  Location: [facility] OR;  Service: Orthopedics;  Laterality: Right;   removal of kidney stone     Right knee arthroscopic surgery     SCLEROTHERAPY  02/22/2023   Procedure: SCLEROTHERAPY;  Surgeon: Corbin Ade, MD;  [facility];  Service: Endoscopy;;   TONSILLECTOMY     TOTAL ABDOMINAL HYSTERECTOMY      HPI  from the history and physical done on the day of admission:   HPI: SAVANNAHA GRAM is a 72 y.o. female with medical history significant of chronic disease, hypertension, T2DM, diastolic CHF, CKD stage IV, history of stroke and GI bleed who presents to the emergency department due to rectal bleeding. Patient was admitted from 4/30 through 5/11 due to acute GI bleed and prior to these, she was  admitted from 4/21 through 4/29 due to GI bleed during which she required 3 units of blood due to anemia of 3.2.  She  underwent EGD and flex sig 4/23 and repeat EGD 4/26 due to persistent bleeding.  EGD 4/23 showed esophageal ulcers esophagitis, oozing duodenal ulcers and clots -she had injection and clips.  EGD 4/26 showed visible vessel with ulcer base, epi and clip placement.  Since discharge on 5/11, bowel movement has been normal until yesterday when she had 2 episodes of bright red blood per rectum and she decided to go to the ED for further evaluation and management.   ED Course:  In the emergency department, she was tachycardic with HR of 105 bpm, but other vital signs are within normal range.  Workup in the ED showed WBC 12.3, hemoglobin of 6.3, hematocrit 20.1, MCV 100.5, platelets 272.  BMP showed sodium 132, potassium 4.9, chloride 105, bicarb 20, blood glucose 311, BUN 73, creatinine 1.70, albumin 1.9. Patient was started on Protonix drip.  2 units of RBCs was ordered to be transfused in the ED.  Gastroenterologist was consulted and [facility] was asked to admit patient for further evaluation and management.   Review of Systems: Review of systems as noted in the HPI. All other systems reviewed and are negative.    Hospital Course:     Brief Narrative:  72 y.o. female with medical history significant for recurrent GI bleed, CKD 4, CVA, dCHF,  DM2, HTN, history of prior stroke with history of peptic ulcer disease with recent bleeding from duodenal ulcer,, small bowel Crohn's disease admitted on 03/13/2023 with  concerns about melena/rectal bleeding and hemoglobin down to 6.3   Dates/Pertinent events: 04/13/11: Presents for OV with Dr. [name] Payor with multiple GI complaints. EGD/Colonoscopy scheduled. 05/18/11: Gastric emptying study, 61% retention (abnormal). 05/29/11: EGD/Colonoscopy: Terminal ileal erosions seen and prescribed Entocort for possible Crohn's disease. Pathology showed  IBD/erosisions. Unk date: Hosp at [facility] for abd pain, N/V. Treated with IV Solumedrol with improvement. Thought terminal ileal erosions may be due to NSAIDs. ESR 47. Reglan prescribed. 07/05/11: F/U OV with Dr. [name] Payor. No  benefit from Entocort or Reglan, still reports bloating and vomiting despite small intake. Noted to have iron deficiency/B12 deficiency anemia. Reglan & Entocort d/c'd. Advised to avoid NSAIDs (Indomethacin). 07/17/11: Hospitalized for N/V/D, --- again found to be hypokalemic--- s/p capsule endo suggestive of Crohn's disease/gastroparesis. Treated with IV Zofran, Dilaudid, IV Reglan. 07/19/11: Underwent capsule endoscopy, found small bowel erosions and 1 aphthous ulcer thought to be from mild Crohn's disease. Entocort re-started. Amitriptyline started. Noted to have some issues with confusion which improved on d/c of Phenergan/narcotics. 01/13/15: Hospitalized at [facility]. MRI brain neg. 01/21/16: Hospitalized with cyclic vomiting and GIB. EGD: Prepyloric ulcer. H. Pylori neg. Once again, told to d/c Indocin. 08/21/16: OV with GI. Reported couldn't afford co-pays on meds prescribed at [facility]. Scheduled for EGD/colonoscopy. Rx: Reglan. 11/28/20: OV GI. Plan to start 6-MP and Humira vs. Cimzia. Testing done for Thiopurine methyltransferase, hep B, TB. Celiac testing neg. 12/01/20: Starter pack for Humira prescribed. "Passed capsule." 02/17/23: Hosp for GIB, hgb 3.2. EGD/Sigmoidoscopy showed esophageal and duodenal ulcers. Repeat EGD 02/22/23 d/t ongoing bleeding. Ongoing NSAID use noted. 02/26/23: Colonoscopy 03/01/23 neg for inflammation.  03/06/23--- EGD with push enteroscopy done on  03/06/23: Friable mucosa, mesenteric ischemia. Discharge on 03/09/2023 -Readmitted on 03/13/2023   -Assessment and Plan: 1)Acute GI bleed --Discharge Hgb up to 9.4  after transfusion of total of 3 units of PRBC this admission - s/p recent  EGD and flex sig 4/23 and repeat EGD 4/26 due to persistent  bleeding Initial EGD from 02/19/2023 showed esophagitis with ulcers as well extensive duodenal ulceration on initial EGD.  Visible vessel treated with epinephrine and hemostasis clip.  She developed recurrent bleeding  - underwent a repeat EGD on 02/22/23 which revealed a visible vessel at a different site in the ulcer crater requiring epinephrine and clip placement.   -H. pylori negative - Limited FS on 02/22/2023 was with  poor prep nondiagnostic. ---Colonoscopy on 03/01/2023 shows no presence of inflammation hematin or active bleeding was present thorughout the colon.  -03/06/23--- EGD with push enteroscopy done on  03/06/23: Friable mucosa, mesenteric ischemia. -CT angio GI bleed from 03/14/2023 shows evidence of duodenitis, without acute intra-abdominal bleeding identified -RBC nuclear tagged scan from 03/14/2023 without active bleeding -Treated with IV Protonix and Carafate -GI consult appreciated -GI bleeding of obscure etiology.  History of small bowel Crohn's.  Extensive duodenal ulcer disease successfully treated with endoscopic therapy.  Biopsies negative for H. pylori or malignancy. On prednisone 40 mg daily for presumed small bowel Crohn's diseas -Previously on Humira -GI recommends discharge on p.o. Protonix twice daily for at least 12 weeks -Discharged on prednisone 40 mg daily until seen by GI service in 2 to 3 weeks -Patient is discharged home with hospice services -palliative care input appreciated -She will follow-up with GI team as outpatient    2)Chronic diastolic CHF (congestive heart failure) (HCC) Stable and compensated.  Last echo 11/2022, EF of 60 to 65%, grade 1 DD.  Not on diuretic. -Monitor closely   3)Diabetes mellitus (HCC) -A1c 6.4 reflecting good diabetic control PTA -Sliding scale insulin as ordered   4)HTN Stable, continue metoprolol succinate 37.5 mg daily   5)Crohn's disease of small intestine without complication (HCC) Stable.  Previously treated with Humira  currently not on medications for this -Recent biopsy during endoscopy was nondiagnostic -patient may benefit from steroids for presumed colitis flareup --Previously on Humira -GI team recommends discharge home on oral prednisone at this time   6) CKD stage -IV -Creatinine currently 2.0  7)Mild Hyponatremia, not POA----suspect dehydration related, -Sodium is stable at 122  8) acute on chronic anemia due to GI bleed--folate and B12 are not low -Hemoglobin is up to 9.4 after transfusion of 1 units of PRBC on 03/17/2023  -patient received a total of 3 units of PRBC this admission   9) social/ethics--- palliative care consult appreciated -She is apparently a retired Public house manager -Patient is a full code -Palliative care conference on  Monday 07/19/23 at 9 AM with patient and family members--- please see full palliative care provider note   Disposition: The patient is from: Home              Anticipated d/c is to: Home     Discharge Condition: Discharge home with hospice services  Follow UP   Follow-up Information     [facility] Follow up.   Specialty: Hospice and Palliative Medicine Why: Home Hospice. Contact information: [address] [facility] [place] [address] [phone]                 Consults obtained -palliative care/hospice/GI  Diet and Activity recommendation:  As advised  Discharge Instructions    Discharge Instructions     Call MD for:  difficulty breathing, headache or visual disturbances   Complete by: As directed    Call MD for:  persistant dizziness or light-headedness   Complete by: As directed    Call MD for:  persistant nausea and vomiting   Complete by: As directed    Call MD for:  temperature >100.4   Complete by: As directed    Diet - low sodium heart healthy   Complete by: As directed    Discharge instructions   Complete by: As directed    1)Novolog Insulin-----per Sliding Scale---Give only if eats 50% or more of meal. insulin  aspart (novoLOG) injection 0-5 Units  0-5 Units Subcutaneous, 3 times daily with meals  CBG 70 - 120: 0 unit CBG 121 - 150: 0 unit  CBG 151 - 200: 1 unit  CBG 201 - 250: 2 units  CBG 251 - 300: 4 units  CBG > 300: 5 units  2)Avoid ibuprofen/Advil/Aleve/Motrin/Goody Powders/Naproxen/BC powders/Meloxicam/Diclofenac/Indomethacin and other Nonsteroidal anti-inflammatory medications as these will make you more likely to bleed and can cause stomach ulcers, can also cause Kidney problems.   3)Repeat CBC and CMP Blood Tests Every Thursday for 4 weeks  starting on 03/21/23   4)Hospice Team will continue to help you once you  get home with overall care plan  5)Follow-up Gastroenterologist Dr. Earnest Bailey with [facility]--- in 2 to 3 weeks for evaluation and possible prednisone taper -address: [address] [place], [place] [address], Phone: [phone]   Discharge wound care:   Complete by: As directed    As above   Increase activity slowly   Complete by: As directed        Discharge Medications     Allergies as of 03/18/2023       Reactions   Cozaar [losartan] Nausea And Vomiting, Swelling, Rash   Quinolones Itching, Nausea And Vomiting, Nausea Only, Swelling   Swelling of face, jaw, and lips   Cymbalta [duloxetine Hcl] Itching, Swelling   Sulfa Antibiotics Nausea And Vomiting, Swelling   Headache  Swelling of feet, legs   Synalar [fluocinolone] Swelling   Cipro [ciprofloxacin Hcl] Nausea And Vomiting, Swelling   Swelling of face, jaw, lips   Diovan [valsartan] Nausea And Vomiting, Swelling   Glucophage [metformin] Other (See Comments)  GI upset   Ketalar [ketamine] Swelling   Norco [hydrocodone-acetaminophen] Itching, Swelling   Norvasc [amlodipine Besylate] Itching, Swelling   Fluid retention    Nsaids Nausea And Vomiting, Other (See Comments)   Has bleeding ulcers   Tape Itching, Rash   Paper tape can only be used on this patient   Zestril  [lisinopril] Swelling, Rash   Oral swelling Red streaks on arms/legs        Medication List     STOP taking these medications    budesonide 3 MG 24 hr capsule Commonly known as: ENTOCORT EC   esomeprazole 40 MG capsule Commonly known as: NexIUM   insulin glargine-yfgn 100 UNIT/ML Pen Commonly known as: SEMGLEE       TAKE these medications    (feeding supplement) PROSource Plus liquid Take 30 mLs by mouth 3 (three) times daily between meals.   acetaminophen 325 MG tablet Commonly known as: TYLENOL Take 2 tablets (650 mg total) by mouth every 6 (six) hours as needed for mild pain, moderate pain or fever.   allopurinol 100 MG tablet Commonly known as: ZYLOPRIM Take 100 mg by mouth daily.   ALPRAZolam 0.5 MG tablet Commonly known as: Xanax Take 1 tablet (0.5 mg total) by mouth 3 (three) times daily as needed for sleep or anxiety.   atorvastatin 80 MG tablet Commonly known as: LIPITOR Take 1 tablet (80 mg total) by mouth daily. Restart on 06/29/22 What changed: when to take this   insulin aspart 100 UNIT/ML FlexPen Commonly known as: NOVOLOG Inject 2-5 Units into the skin 3 (three) times daily with meals. Give only if eats 50% or more of meal. insulin aspart (novoLOG) injection 0-5 Units 0-5 Units Subcutaneous, 3 times daily with meals CBG 70 - 120: 0 unit CBG 121 - 150: 0 unit  CBG 151 - 200: 1 unit CBG 201 - 250: 2 units CBG 251 - 300: 4 units CBG > 300: 5 units What changed:  how much to take additional instructions   lactobacillus acidophilus & bulgar chewable tablet Chew 1 tablet by mouth 3 (three) times daily with meals for 10 days.   levETIRAcetam 500 MG tablet Commonly known as: KEPPRA Take 500 mg by mouth 2 (two) times daily.   metoprolol succinate 25 MG 24 hr tablet Commonly known as: TOPROL-XL Take 1.5 tablets (37.5 mg total) by mouth daily.   ondansetron 4 MG disintegrating tablet Commonly known as: ZOFRAN-ODT Take 4 mg by mouth every 6 (six)  hours as needed for nausea or vomiting.   oxyCODONE 5 MG immediate release tablet Commonly known as: Oxy IR/ROXICODONE Take 1 tablet (5 mg total) by mouth every 12 (twelve) hours as needed for moderate pain.   pantoprazole 40 MG tablet Commonly known as: Protonix Take 1 tablet (40 mg total) by mouth 2 (two) times daily.   predniSONE 20 MG tablet Commonly known as: DELTASONE Take 2 tablets (40 mg total) by mouth daily with breakfast. Start taking on: Mar 19, 2023   sodium bicarbonate 650 MG tablet Take 1 tablet (650 mg total) by mouth 2 (two) times daily.   sucralfate 1 GM/10ML suspension Commonly known as: CARAFATE Take 10 mLs (1 g total) by mouth 4 (four) times daily -  with meals and at bedtime.   venlafaxine XR 150 MG 24 hr capsule Commonly known as: EFFEXOR-XR Take 150 mg by mouth daily.               Discharge Care Instructions  (From admission,  onward)           Start     Ordered   03/18/23 0000  Discharge wound care:       Comments: As above   03/18/23 1434            Major procedures and Radiology Reports - PLEASE review detailed and final reports for all details, in brief -   CT ANGIO GI BLEED  Result Date: 03/14/2023 CLINICAL DATA:  gi bleed, acute blood loss anemia wall EXAM: CTA ABDOMEN AND PELVIS WITHOUT AND WITH CONTRAST TECHNIQUE: Multidetector CT imaging of the abdomen and pelvis was performed using the standard protocol during bolus administration of intravenous contrast. Multiplanar reconstructed images and MIPs were obtained and reviewed to evaluate the vascular anatomy. RADIATION DOSE REDUCTION: This exam was performed according to the departmental dose-optimization program which includes automated exposure control, adjustment of the mA and/or kV according to patient size and/or use of iterative reconstruction technique. CONTRAST:  75mL OMNIPAQUE IOHEXOL 350 MG/ML SOLN COMPARISON:  02/17/2023 FINDINGS: VASCULAR Aorta: Moderate partially  calcified atheromatous plaque particularly in the infrarenal segment. No aneurysm, dissection, or stenosis. Celiac: Patent without evidence of aneurysm, dissection, vasculitis or significant stenosis. Common hepatic artery arises directly the aorta, an anatomic variant. SMA: Calcified ostial plaque with only mild stenosis, atheromatous but patent distally with classic distal branch anatomy. Renals: Single left, with calcified ostial plaque extending over length of at least 1.3 cm resulting and at least mild stenosis, patent distally. Single right, with heavily calcified ostial plaque result extending over length of at least 1.8 cm, resulting in at least moderate stenosis, patent distally. IMA: Patent without evidence of aneurysm, dissection, vasculitis or significant stenosis. Inflow: Moderate calcified atheromatous plaque. Common and external iliac arteries are patent. Segmental occlusion or high-grade stenosis in the proximal right internal iliac artery, patent distally. Proximal Outflow: Moderately atheromatous, without high-grade stenosis or occlusion. Veins: Patent hepatic veins, portal vein, SMV. Venous collateral channels from the splenic hilum suggesting central stenosis or occlusion of the splenic vein, which is poorly visualized. Iliac venous system, IVC, and renal veins unremarkable. Review of the MIP images confirms the above findings. NON-VASCULAR Lower chest: Small bilateral pleural effusions as before, with dependent atelectasis/consolidation in both lung bases. Hepatobiliary: No focal liver abnormality is seen. Status post cholecystectomy. No biliary dilatation. Pancreas: Parenchymal atrophy with mild ductal dilatation in the body. There is a 2.4 cm low-attenuation process with soft tissue rim in the pancreatic tail, as seen on previous study. Spleen: Normal in size without focal abnormality. Adrenals/Urinary Tract: No adrenal mass. Symmetric renal parenchymal enhancement without worrisome lesion or  hydronephrosis. Urinary bladder distended. Stomach/Bowel: No acute intraluminal extravasation identified. Stomach is decompressed. There is circumferential low-attenuation wall thickening the proximal duodenum. Small bowel is nondistended. Appendix not identified. The colon is incompletely distended bowel gas and fecal material, without acute finding. Lymphatic: No abdominal or pelvic adenopathy. Reproductive: Status post hysterectomy. No adnexal masses. Other: Small volume pelvic and perihepatic ascites.  No free air. Musculoskeletal: Diffuse body wall anasarca. Fixation hardware across right femoral IT fracture. Subacute appearing T12 vertebral compression fracture, stable since 02/17/2023. Post instrumented fusion L1-L4. IMPRESSION: 1. No acute intraluminal bleeding identified. 2. Wall thickening in the proximal duodenum suggesting duodenitis. 3. Stable 2.4 cm low-attenuation process in the pancreatic tail, possibly pseudocyst related to previous pancreatitis. There may be associated splenic vein occlusion. 4. Small bilateral pleural effusions, ascites, and body wall anasarca. 5.  Aortic Atherosclerosis (ICD10-I70.0). Electronically Signed   By:  Corlis Leak M.D.   On: 03/14/2023 17:00   NM GI Blood Loss  Result Date: 03/14/2023 CLINICAL DATA:  GI bleeding. EXAM: NUCLEAR MEDICINE GASTROINTESTINAL BLEEDING SCAN TECHNIQUE: Sequential abdominal images were obtained following intravenous administration of Tc-14m labeled red blood cells. Imaging was performed for 120 minutes. RADIOPHARMACEUTICALS:  25 mCi Tc-8m pertechnetate in-vitro labeled red cells. COMPARISON:  None Available. FINDINGS: Normal distribution of radiopharmaceutical activity is seen within the abdomen and pelvis. No active GI bleeding is seen during the 1st or 2nd hours of the study. IMPRESSION: No evidence of active GI bleeding. Electronically Signed   By: Danae Orleans M.D.   On: 03/14/2023 16:32   US Venous Img Lower Unilateral Left  (DVT)  Result Date: 03/14/2023 CLINICAL DATA:  Left lower extremity edema. EXAM: LEFT LOWER EXTREMITY VENOUS DOPPLER ULTRASOUND TECHNIQUE: Gray-scale sonography with graded compression, as well as color Doppler and duplex ultrasound were performed to evaluate the lower extremity deep venous systems from the level of the common femoral vein and including the common femoral, femoral, profunda femoral, popliteal and calf veins including the posterior tibial, peroneal and gastrocnemius veins when visible. The superficial great saphenous vein was also interrogated. Spectral Doppler was utilized to evaluate flow at rest and with distal augmentation maneuvers in the common femoral, femoral and popliteal veins. COMPARISON:  None Available. FINDINGS: Contralateral Common Femoral Vein: Respiratory phasicity is normal and symmetric with the symptomatic side. No evidence of thrombus. Normal compressibility. Common Femoral Vein: No evidence of thrombus. Normal compressibility, respiratory phasicity and response to augmentation. Saphenofemoral Junction: No evidence of thrombus. Normal compressibility and flow on color Doppler imaging. Profunda Femoral Vein: No evidence of thrombus. Normal compressibility and flow on color Doppler imaging. Femoral Vein: No evidence of thrombus. Normal compressibility, respiratory phasicity and response to augmentation. Popliteal Vein: No evidence of thrombus. Normal compressibility, respiratory phasicity and response to augmentation. Calf Veins: No evidence of thrombus. Normal compressibility and flow on color Doppler imaging. Superficial Great Saphenous Vein: No evidence of thrombus. Normal compressibility. Venous Reflux:  None. Other Findings: Fluid collection in the left popliteal fossa is consistent with a Baker's cyst and measures approximately 4 x 1.2 x 2.5 cm. IMPRESSION: 1. No evidence of left lower extremity deep vein thrombosis. 2. Left popliteal fossa Baker's cyst. Electronically  Signed   By: Irish Lack M.D.   On: 03/14/2023 11:10   DG Abd 1 View  Result Date: 03/14/2023 CLINICAL DATA:  Foreign body of alimentary tract, initial encounter. Follow-up. EXAM: ABDOMEN - 1 VIEW COMPARISON:  KUB 03/06/2023 and 03/05/2023 FINDINGS: Air is seen within nondistended loops of small and large bowel. Right upper quadrant cholecystectomy clips are again seen. The prior approximate 1.1 cm density previously overlying the ascending colon within the right lower abdominal quadrant is no longer visualized. Mild stool within the ascending colon, decreased from prior. No portal venous gas or pneumatosis is seen, noting the superior aspect of the liver is not imaged. Vascular phleboliths overlie the pelvis. There is diffuse decreased bone mineralization. Lumbar rod and screw fusion hardware and right femoral cephalomedullary nail fixation are again noted. IMPRESSION: 1. The prior approximate 1.1 cm density previously overlying the ascending colon within the right lower abdominal quadrant is no longer visualized. Presumably the endoscopy capsule has passed. 2. Nonobstructive bowel gas pattern. Electronically Signed   By: Neita Garnet M.D.   On: 03/14/2023 09:10   [place] EKG SITE RITE  Result Date: 03/14/2023 If Site Rite image not attached, placement could not  be confirmed due to current cardiac rhythm.  [place] MESENTERIC ARTERIES  Result Date: 03/07/2023 CLINICAL DATA:  Anemia Rectal bleeding Hypertension Stroke Diabetes Hyperlipidemia EXAM: [place] MESENTERIC ARTERIAL DOPPLER COMPARISON:  CT abdomen pelvis without contrast 02/17/2023 FINDINGS: Celiac axis: 99 cm/sec Celiac axis with inspiration: 102 cm/sec Celiac axis with expiration: 97 cm/sec Splenic artery: 93 cm/sec Hepatic artery: 110 cm/sec SMA: 242 cm/sec IMA: 112 cm/sec Aorta: 145 cm/sec Aortic size: 1.8 cm proximally, 1.4 cm in the mid segment and 1.2 cm distally IMPRESSION: 1. No hemodynamically significant stenosis of the celiac, superior  mesenteric, or inferior mesenteric arteries. 2. Atherosclerotic calcifications seen throughout the abdominal aorta. Electronically Signed   By: Acquanetta Belling M.D.   On: 03/07/2023 15:25   DG Abd 1 View  Result Date: 03/06/2023 CLINICAL DATA:  Retained foreign body. EXAM: ABDOMEN - 1 VIEW COMPARISON:  03/05/2023. FINDINGS: The bowel gas pattern is normal. A moderate amount of retained stool is present in the colon. Cholecystectomy clips are noted in the right upper quadrant. A 1.1 cm radiopaque density projects over the right lower quadrant which was seen on the prior exam. Extensive vascular calcifications are noted in the left upper quadrant, pelvis, and bilateral lower extremities. Fixation hardware is noted in the proximal right femur. Lumbar spinal fusion hardware is noted. IMPRESSION: 1. Radiopaque density in the right lower quadrant measuring 1.1 cm, unchanged from the prior exam, possible endoscopy capsule. 2. Nonobstructive bowel-gas pattern. 3. Moderate amount of retained stool in the colon. Electronically Signed   By: Thornell Sartorius M.D.   On: 03/06/2023 04:55   DG Abd 1 View  Result Date: 03/05/2023 CLINICAL DATA:  Retained foreign body. Retained capsule from yesterday. EXAM: ABDOMEN - 1 VIEW COMPARISON:  CT abdomen/pelvis 02/17/2023. FINDINGS: No dilated loops of small bowel are demonstrated to suggest small bowel obstruction. Moderate colonic stool burden. A 1.7 cm dense object projects in the region of the right lower quadrant of the abdomen/right hemipelvis (see annotations on images). Surgical clips within the right upper quadrant of the abdomen. Lumbar posterior spinal fusion construct. Prior ORIF of the right femur, incompletely imaged.No acute osseous abnormality identified. IMPRESSION: 1. 1.7 cm dense object projecting in the region of the right lower quadrant of the abdomen/right hemipelvis, likely reflecting the reported endoscopy capsule. 2. Nonobstructive bowel gas pattern. 3. Moderate  colonic stool burden. Electronically Signed   By: Jackey Loge D.O.   On: 03/05/2023 14:05   [place] EKG SITE RITE  Result Date: 02/27/2023 If Site Rite image not attached, placement could not be confirmed due to current cardiac rhythm.  CT ABDOMEN PELVIS WO CONTRAST  Result Date: 02/17/2023 CLINICAL DATA:  Concern for bowel obstruction. Patient reportedly has red stool. EXAM: CT ABDOMEN AND PELVIS WITHOUT CONTRAST TECHNIQUE: Multidetector CT imaging of the abdomen and pelvis was performed following the standard protocol without IV contrast. RADIATION DOSE REDUCTION: This exam was performed according to the departmental dose-optimization program which includes automated exposure control, adjustment of the mA and/or kV according to patient size and/or use of iterative reconstruction technique. COMPARISON:  CT abdomen pelvis dated 02/04/2023. FINDINGS: Lower chest: Moderate bilateral pleural effusions with associated atelectasis are partially imaged. The heart is mildly enlarged. The blood pool is hypoattenuating, suggestive of anemia. Hepatobiliary: No focal liver abnormality is seen. Status post cholecystectomy. No biliary dilatation. Pancreas: Atrophic. A 1.8 cm low-density lesion in the pancreatic tail appears unchanged. No pancreatic ductal dilatation or surrounding inflammatory changes. Spleen: Normal in size without focal abnormality. Adrenals/Urinary  Tract: Adrenal glands are unremarkable. Bilateral punctate calcifications in both kidneys may represent nonobstructing calculi measuring up to 2 mm and/or vascular calcifications. No suspicious focal lesion or hydronephrosis on either side. Bladder is unremarkable. Stomach/Bowel: Stomach is within normal limits. The appendix is not definitely identified, however no pericecal inflammatory changes are noted to suggest acute appendicitis. There is a mild bowel wall thickening and mucosal enhancement involving portions of the sigmoid colon and rectum. No significant  surrounding inflammatory changes. No bowel obstruction. Vascular/Lymphatic: Aortic atherosclerosis. No enlarged abdominal or pelvic lymph nodes. Reproductive: Status post hysterectomy. No adnexal masses. Other: No abdominal wall hernia or abnormality. No abdominopelvic ascites. Anasarca is noted Musculoskeletal: There has been slight progression since 02/04/2023 of a T12 compression deformity with approximately 50% height loss centrally and 3 mm retropulsion into the central canal. Fixation hardware is seen in the right femur. Fixation hardware in the lumbar spine from L1-L4. IMPRESSION: 1. Mild bowel wall thickening and mucosal enhancement involving portions of the sigmoid colon and rectum may reflect proctocolitis. No bowel obstruction. 2. Moderate bilateral pleural effusions with associated atelectasis. 3. Slight progression since 02/04/2023 of a T12 compression deformity with approximately 50% height loss centrally and 3 mm retropulsion into the central canal. 4. Low-density pancreatic lesion. When the patient is clinically stable and able to follow directions and hold their breath (preferably as an outpatient) further evaluation with dedicated abdominal MRI could be considered for further characterization. Aortic Atherosclerosis (ICD10-I70.0). Electronically Signed   By: Romona Curls M.D.   On: 02/17/2023 17:36    Today   Subjective    Yasaira Rosengrant today has no new concerns -No further bleeding concerns -= Patient's husband and grandkids at bedside No fever  Or chills  -Patient is discharged home with hospice services  Patient has been seen and examined prior to discharge   Objective   Blood pressure (!) 160/82, pulse 96, temperature 97.7 F (36.5 C), temperature source Oral, resp. rate 18, height 5\' 4"  (1.626 m), weight 54.4 kg, SpO2 100 %.   Intake/Output Summary (Last 24 hours) at 03/18/2023 1445 Last data filed at 03/18/2023 1200 Gross per 24 hour  Intake 3250 ml  Output 600 ml   Net 2650 ml   Exam Gen:- Awake Alert, no acute distress  HEENT:- [place].AT, No sclera icterus Neck-Supple Neck,No JVD,.  Lungs-  CTAB , good air movement bilaterally CV- S1, S2 normal, regular Abd-  +ve B.Sounds, Abd Soft, No tenderness,    Extremity/Skin:-Proving upper extremity edema,   good pulses Psych-affect is flat, tearful at times, oriented x3 Neuro-generalized weakness, no new focal deficits, no tremors    Data Review   CBC w Diff:  Lab Results  Component Value Date   WBC 14.3 (H) 03/18/2023   HGB 9.4 (L) 03/18/2023   HGB 9.6 (L) 07/31/2022   HCT 28.2 (L) 03/18/2023   HCT 29.0 (L) 07/31/2022   PLT 145 (L) 03/18/2023   PLT 212 07/31/2022   LYMPHOPCT 8 02/26/2023   MONOPCT 4 02/26/2023   EOSPCT 3 02/26/2023   BASOPCT 1 02/26/2023    CMP:  Lab Results  Component Value Date   NA 132 (L) 03/18/2023   NA 136 07/31/2022   K 4.1 03/18/2023   CL 108 03/18/2023   CO2 17 (L) 03/18/2023   BUN 71 (H) 03/18/2023   BUN 40 (H) 07/31/2022   CREATININE 2.03 (H) 03/18/2023   PROT 4.1 (L) 03/14/2023   PROT 7.0 06/17/2017   ALBUMIN 1.7 (L) 03/18/2023  ALBUMIN 4.4 06/17/2017   BILITOT 0.6 03/14/2023   BILITOT 0.3 06/17/2017   ALKPHOS 69 03/14/2023   AST 25 03/14/2023   ALT 19 03/14/2023  .  Total Discharge time is about 33 minutes  Shon Hale M.D on 03/18/2023 at 2:45 PM  Go to [email or web address] -  for contact info  [facility] - Office  [phone]

## 2023-03-18 NOTE — Inpatient Diabetes Management (Signed)
Inpatient Diabetes Program Recommendations  AACE/ADA: New Consensus Statement on Inpatient Glycemic Control (2015)  Target Ranges:  Prepandial:   less than 140 mg/dL      Peak postprandial:   less than 180 mg/dL (1-2 hours)      Critically ill patients:  140 - 180 mg/dL   Lab Results  Component Value Date   GLUCAP 211 (H) 03/18/2023   HGBA1C 5.7 (H) 02/26/2023    Review of Glycemic Control  Latest Reference Range & Units 03/17/23 07:15 03/17/23 12:22 03/17/23 16:12 03/17/23 20:13 03/18/23 00:04 03/18/23 03:57 03/18/23 07:36  Glucose-Capillary 70 - 99 mg/dL 161 (H) 096 (H) 045 (H) 382 (H) 344 (H) 267 (H) 211 (H)  (H): Data is abnormally high  Diabetes history: DM2 Outpatient Diabetes medications: Semglee 5 units QD, Novolog 2-5 units TID Current orders for Inpatient glycemic control: Novolog 0-9 units TID; Prednisone 40 mg QD  Inpatient Diabetes Program Recommendations:    If steroids continue, please consider:  Semglee 10 units QD  Novolog 3 units TID with meals   Will continue to follow while inpatient.  Thank you, Dulce Sellar, MSN, CDCES Diabetes Coordinator Inpatient Diabetes Program [phone] (team pager from 8a-5p)'

## 2023-03-18 NOTE — Consult Note (Signed)
[facility] Customer [facility] manager [facility]) Accountable Care Organization (ACO) [facility] Liaison Note  03/18/2023  Heather [name] 10-Nov-1950 [id number]  Location: [facility] RN Hospital Liaison screened the patient remotely at [facility].  Insurance: [employer]   Heather [name] is a 72 y.o. female who is a Primary Care Patient of [name], MD [facility]). The patient was screened for 7 and 30 day readmission hospitalization with noted extreme risk score for unplanned readmission risk with 7 IP in 6 months.  The patient was assessed for potential [facility]) Care Management service needs for post hospital transition for care coordination. Review of patient's electronic medical record reveals patient Admitted with Rectal Bleeding. Also indicated hospice will make a home visit for services. Hospice will manage pt's ongoing needs .   [facility] Care Management/Population Health does not replace or interfere with any arrangements made by the Inpatient Transition of Care team.   For questions contact:   Elliot Cousin, RN, BSN [facility] Liaison [place]   [facility]  Population Health Office Hours MTWF  8:00 am-6:00 pm Off on Thursday [phone] mobile [phone] [Office toll free line][facility] Office Hours are M-F 8:30 - 5 pm 24 hour nurse advise line [phone] Concierge  [email or web address]

## 2023-03-18 NOTE — Progress Notes (Signed)
Patient has continued to yell out and press the call bell an innumerous amount of times. Patient has been assisted by multiple staff members including charge RN. Continues to request foods that are not compliant with her diet order. Patient's blood glucose continues to be elevated. Patient educated.

## 2023-03-18 NOTE — Progress Notes (Signed)
I went to patient's room because she was screaming my name. I entered the room and the patient stated, " I just wanted you to come cover up my feet with the blanket". Educated patient on using the call bell for any request and not to scream out. Patient then asked for "ice cream, cracker and a boost". Patient was not given any of those items due to her elevated blood glucose and carb modified diet order. Plan of care ongoing.

## 2023-03-19 LAB — GLUCOSE, CAPILLARY
Glucose-Capillary: 155 mg/dL — ABNORMAL HIGH (ref 70–99)
Glucose-Capillary: 158 mg/dL — ABNORMAL HIGH (ref 70–99)

## 2023-03-19 NOTE — Progress Notes (Addendum)
chart reviewed, .Please see full discharge summary dictated by  Dr. Jayme Cloud on 03/18/23   She was initially discharged on 5/20 /2024 to home with Hospice sdervices, apparently EMS transport could not pick patient up until a.m. of 03/19/2023 - -I attempted to round on patient on 03/19/2023,  however she was already picked up by EMS and left the Building already just after 8 AM today----prior to this writer being able to round on her and see how - Patient was neither seen, nor evaluated by me on 03/19/2023--as she left the building  by EMS transport to go home with hospice services just after 8 AM on 03/19/2023 - This is no charge note  Shon Hale, MD

## 2023-03-24 ENCOUNTER — Emergency Department (HOSPITAL_COMMUNITY)
Admission: EM | Admit: 2023-03-24 | Discharge: 2023-03-25 | Disposition: A | Discharge status: Home / Self Care | Attending: Emergency Medicine | Admitting: Emergency Medicine

## 2023-03-24 ENCOUNTER — Encounter (HOSPITAL_COMMUNITY): Payer: Self-pay

## 2023-03-24 ENCOUNTER — Other Ambulatory Visit: Payer: Self-pay

## 2023-03-24 DIAGNOSIS — R82998 Other abnormal findings in urine: Secondary | ICD-10-CM | POA: Insufficient documentation

## 2023-03-24 DIAGNOSIS — S42201A Unspecified fracture of upper end of right humerus, initial encounter for closed fracture: Secondary | ICD-10-CM | POA: Diagnosis not present

## 2023-03-24 DIAGNOSIS — R600 Localized edema: Secondary | ICD-10-CM | POA: Insufficient documentation

## 2023-03-24 DIAGNOSIS — M25512 Pain in left shoulder: Secondary | ICD-10-CM | POA: Diagnosis not present

## 2023-03-24 DIAGNOSIS — S4991XA Unspecified injury of right shoulder and upper arm, initial encounter: Secondary | ICD-10-CM | POA: Diagnosis present

## 2023-03-24 DIAGNOSIS — I10 Essential (primary) hypertension: Secondary | ICD-10-CM | POA: Diagnosis not present

## 2023-03-24 DIAGNOSIS — D649 Anemia, unspecified: Secondary | ICD-10-CM | POA: Diagnosis not present

## 2023-03-24 DIAGNOSIS — M79603 Pain in arm, unspecified: Secondary | ICD-10-CM | POA: Diagnosis not present

## 2023-03-24 DIAGNOSIS — W06XXXA Fall from bed, initial encounter: Secondary | ICD-10-CM | POA: Diagnosis not present

## 2023-03-24 DIAGNOSIS — M25519 Pain in unspecified shoulder: Secondary | ICD-10-CM | POA: Diagnosis not present

## 2023-03-24 DIAGNOSIS — R739 Hyperglycemia, unspecified: Secondary | ICD-10-CM | POA: Diagnosis not present

## 2023-03-24 DIAGNOSIS — S42211A Unspecified displaced fracture of surgical neck of right humerus, initial encounter for closed fracture: Secondary | ICD-10-CM | POA: Diagnosis not present

## 2023-03-24 DIAGNOSIS — Z794 Long term (current) use of insulin: Secondary | ICD-10-CM | POA: Diagnosis not present

## 2023-03-24 DIAGNOSIS — R0902 Hypoxemia: Secondary | ICD-10-CM | POA: Diagnosis not present

## 2023-03-24 DIAGNOSIS — S42201G Unspecified fracture of upper end of right humerus, subsequent encounter for fracture with delayed healing: Secondary | ICD-10-CM

## 2023-03-24 NOTE — ED Notes (Signed)
Pt yelling and screaming at everyone who walks by begging for a valium. Educated multiple times that Valium is not ordered for her. Patient refusing education.

## 2023-03-24 NOTE — ED Triage Notes (Signed)
Patient from home for a fall. EMS reports patient rolled out of bed and landed on a pad that is kept in the floor for safety. Denies hitting head, LOC. Upon arrival to ER, patient is alert and oriented to person and place. EMS reports patient is on hospice for ESRD(not on dialysis) and a mass on her brain. Patient has swelling noted to bilateral feet and bilateral arms; L arm is wrapped due to drainage. Patient reports R shoulder pain.

## 2023-03-25 ENCOUNTER — Emergency Department (HOSPITAL_COMMUNITY)

## 2023-03-25 DIAGNOSIS — S42211A Unspecified displaced fracture of surgical neck of right humerus, initial encounter for closed fracture: Secondary | ICD-10-CM | POA: Diagnosis not present

## 2023-03-25 LAB — CBC WITH DIFFERENTIAL/PLATELET
Abs Immature Granulocytes: 0.07 10*3/uL (ref 0.00–0.07)
Basophils Absolute: 0 10*3/uL (ref 0.0–0.1)
Basophils Relative: 0 %
Eosinophils Absolute: 0 10*3/uL (ref 0.0–0.5)
Eosinophils Relative: 0 %
HCT: 20.5 % — ABNORMAL LOW (ref 36.0–46.0)
Hemoglobin: 6.5 g/dL — CL (ref 12.0–15.0)
Immature Granulocytes: 1 %
Lymphocytes Relative: 2 %
Lymphs Abs: 0.2 10*3/uL — ABNORMAL LOW (ref 0.7–4.0)
MCH: 31.4 pg (ref 26.0–34.0)
MCHC: 31.7 g/dL (ref 30.0–36.0)
MCV: 99 fL (ref 80.0–100.0)
Monocytes Absolute: 0.3 10*3/uL (ref 0.1–1.0)
Monocytes Relative: 3 %
Neutro Abs: 11.8 10*3/uL — ABNORMAL HIGH (ref 1.7–7.7)
Neutrophils Relative %: 94 %
Platelets: 179 10*3/uL (ref 150–400)
RBC: 2.07 MIL/uL — ABNORMAL LOW (ref 3.87–5.11)
RDW: 17.6 % — ABNORMAL HIGH (ref 11.5–15.5)
WBC: 12.4 10*3/uL — ABNORMAL HIGH (ref 4.0–10.5)
nRBC: 0 % (ref 0.0–0.2)

## 2023-03-25 LAB — URINALYSIS, W/ REFLEX TO CULTURE (INFECTION SUSPECTED)
Bilirubin Urine: NEGATIVE
Glucose, UA: 50 mg/dL — AB
Ketones, ur: NEGATIVE mg/dL
Nitrite: NEGATIVE
Protein, ur: 100 mg/dL — AB
Specific Gravity, Urine: 1.016 (ref 1.005–1.030)
WBC, UA: >50 WBC/hpf (ref 0–5)
pH: 5 (ref 5.0–8.0)

## 2023-03-25 LAB — COMPREHENSIVE METABOLIC PANEL
ALT: 32 U/L (ref 0–44)
AST: 20 U/L (ref 15–41)
Albumin: 2 g/dL — ABNORMAL LOW (ref 3.5–5.0)
Alkaline Phosphatase: 73 U/L (ref 38–126)
Anion gap: 13 (ref 5–15)
BUN: 93 mg/dL — ABNORMAL HIGH (ref 8–23)
CO2: 13 mmol/L — ABNORMAL LOW (ref 22–32)
Calcium: 8 mg/dL — ABNORMAL LOW (ref 8.9–10.3)
Chloride: 106 mmol/L (ref 98–111)
Creatinine, Ser: 2.02 mg/dL — ABNORMAL HIGH (ref 0.44–1.00)
GFR, Estimated: 26 mL/min — ABNORMAL LOW (ref >60)
Glucose, Bld: 393 mg/dL — ABNORMAL HIGH (ref 70–99)
Potassium: 5.4 mmol/L — ABNORMAL HIGH (ref 3.5–5.1)
Sodium: 132 mmol/L — ABNORMAL LOW (ref 135–145)
Total Bilirubin: 0.6 mg/dL (ref 0.3–1.2)
Total Protein: 4.9 g/dL — ABNORMAL LOW (ref 6.5–8.1)

## 2023-03-25 LAB — TYPE AND SCREEN
ABO/RH(D): O POS
Antibody Screen: NEGATIVE

## 2023-03-25 LAB — CBG MONITORING, ED
Glucose-Capillary: 328 mg/dL — ABNORMAL HIGH (ref 70–99)
Glucose-Capillary: 345 mg/dL — ABNORMAL HIGH (ref 70–99)
Glucose-Capillary: 274 mg/dL — ABNORMAL HIGH (ref 70–99)

## 2023-03-25 LAB — PREPARE RBC (CROSSMATCH)

## 2023-03-25 LAB — BPAM RBC: Blood Product Expiration Date: 202406212359

## 2023-03-25 LAB — BRAIN NATRIURETIC PEPTIDE: B Natriuretic Peptide: 872 pg/mL — ABNORMAL HIGH (ref 0.0–100.0)

## 2023-03-25 MED ORDER — OXYCODONE-ACETAMINOPHEN 5-325 MG PO TABS
1.0000 | ORAL_TABLET | Freq: Once | ORAL | Status: AC
  Administered 2023-03-25: 1 via ORAL
  Filled 2023-03-25: qty 1

## 2023-03-25 MED ORDER — INSULIN ASPART 100 UNIT/ML IJ SOLN
0.0000 [IU] | INTRAMUSCULAR | Status: DC
  Administered 2023-03-25: 11 [IU] via SUBCUTANEOUS
  Administered 2023-03-25: 8 [IU] via SUBCUTANEOUS
  Filled 2023-03-25 (×2): qty 1

## 2023-03-25 MED ORDER — FUROSEMIDE 10 MG/ML IJ SOLN
40.0000 mg | Freq: Once | INTRAMUSCULAR | Status: AC
  Administered 2023-03-25: 40 mg via INTRAVENOUS
  Filled 2023-03-25: qty 4

## 2023-03-25 MED ORDER — SODIUM CHLORIDE 0.9% IV SOLUTION: Freq: Once | INTRAVENOUS | Status: AC

## 2023-03-25 NOTE — ED Notes (Signed)
Pt given water and crackers per MD

## 2023-03-25 NOTE — Discharge Instructions (Addendum)
Return to the emergency department at anytime for new or worsening symptoms of concern.

## 2023-03-25 NOTE — ED Provider Notes (Signed)
[place] EMERGENCY DEPARTMENT AT [facility] Provider Note   CSN: [id number] Arrival date & time: 03/24/23  2250     History  Chief Complaint  Patient presents with   Marletta Lor    Heather [name] is a 72 y.o. female.  Patient presents to the emergency department after a fall.  Patient reports that she fell out of bed.  She is not sure how this occurred.  Patient complaining of pain into both of her upper arms and shoulder area.  No headache, no loss of consciousness.       Home Medications Prior to Admission medications   Medication Sig Start Date End Date Taking? Authorizing Provider  acetaminophen (TYLENOL) 325 MG tablet Take 2 tablets (650 mg total) by mouth every 6 (six) hours as needed for mild pain, moderate pain or fever. 03/18/23   Shon Hale, MD  allopurinol (ZYLOPRIM) 100 MG tablet Take 100 mg by mouth daily. 03/20/21   [provider]  ALPRAZolam Prudy Feeler) 0.5 MG tablet Take 1 tablet (0.5 mg total) by mouth 3 (three) times daily as needed for sleep or anxiety. 03/18/23 03/17/24  Shon Hale, MD  atorvastatin (LIPITOR) 80 MG tablet Take 1 tablet (80 mg total) by mouth daily. Restart on 06/29/22 Patient taking differently: Take 80 mg by mouth at bedtime. Restart on 06/29/22 06/22/22   Catarina Hartshorn, MD  insulin aspart (NOVOLOG) 100 UNIT/ML FlexPen Inject 2-5 Units into the skin 3 (three) times daily with meals. Give only if eats 50% or more of meal. insulin aspart (novoLOG) injection 0-5 Units 0-5 Units Subcutaneous, 3 times daily with meals CBG 70 - 120: 0 unit CBG 121 - 150: 0 unit  CBG 151 - 200: 1 unit CBG 201 - 250: 2 units CBG 251 - 300: 4 units CBG > 300: 5 units 03/18/23   Emokpae, [name], MD  levETIRAcetam (KEPPRA) 500 MG tablet Take 500 mg by mouth 2 (two) times daily.    [provider]  metoprolol succinate (TOPROL-XL) 25 MG 24 hr tablet Take 1.5 tablets (37.5 mg total) by mouth daily. 02/11/23   Johnson, Clanford L, MD  Nutritional  Supplements (,FEEDING SUPPLEMENT, PROSOURCE PLUS) liquid Take 30 mLs by mouth 3 (three) times daily between meals. 02/25/23 03/27/23  Kendell Bane, MD  ondansetron (ZOFRAN-ODT) 4 MG disintegrating tablet Take 4 mg by mouth every 6 (six) hours as needed for nausea or vomiting. 01/21/23   [provider]  oxyCODONE (OXY IR/ROXICODONE) 5 MG immediate release tablet Take 1 tablet (5 mg total) by mouth every 12 (twelve) hours as needed for moderate pain. 03/18/23   Shon Hale, MD  pantoprazole (PROTONIX) 40 MG tablet Take 1 tablet (40 mg total) by mouth 2 (two) times daily. 03/18/23 03/17/24  Shon Hale, MD  predniSONE (DELTASONE) 20 MG tablet Take 2 tablets (40 mg total) by mouth daily with breakfast. 03/19/23   Shon Hale, MD  sodium bicarbonate 650 MG tablet Take 1 tablet (650 mg total) by mouth 2 (two) times daily. 03/18/23   Shon Hale, MD  sucralfate (CARAFATE) 1 GM/10ML suspension Take 10 mLs (1 g total) by mouth 4 (four) times daily -  with meals and at bedtime. 03/18/23   Shon Hale, MD  venlafaxine XR (EFFEXOR-XR) 150 MG 24 hr capsule Take 150 mg by mouth daily. 07/11/22   [provider]      Allergies    Cozaar [losartan], Quinolones, Cymbalta [duloxetine hcl], Sulfa antibiotics, Synalar [fluocinolone], Cipro [ciprofloxacin hcl], Diovan [valsartan],  Glucophage [metformin], Ketalar [ketamine], Norco [hydrocodone-acetaminophen], Norvasc [amlodipine besylate], Nsaids, Tape, and Zestril [lisinopril]    Review of Systems   Review of Systems  Physical Exam Updated Vital Signs BP 134/71 (BP Location: Right Arm)   Pulse (!) 143   Temp 97.8 F (36.6 C)   Ht 5\' 4"  (1.626 m)   Wt 76.2 kg   LMP  (LMP Unknown)   SpO2 93%   BMI 28.82 kg/m  Physical Exam Vitals and nursing note reviewed.  Constitutional:      General: She is not in acute distress.    Appearance: She is well-developed.  HENT:     Head: Normocephalic and atraumatic.      Mouth/Throat:     Mouth: Mucous membranes are moist.  Eyes:     General: Vision grossly intact. Gaze aligned appropriately.     Extraocular Movements: Extraocular movements intact.     Conjunctiva/sclera: Conjunctivae normal.  Cardiovascular:     Rate and Rhythm: Normal rate and regular rhythm.     Pulses: Normal pulses.     Heart sounds: Normal heart sounds, S1 normal and S2 normal. No murmur heard.    No friction rub. No gallop.  Pulmonary:     Effort: Pulmonary effort is normal. No respiratory distress.     Breath sounds: Normal breath sounds.  Abdominal:     General: Bowel sounds are normal.     Palpations: Abdomen is soft.     Tenderness: There is no abdominal tenderness. There is no guarding or rebound.     Hernia: No hernia is present.  Musculoskeletal:        General: No swelling.     Right shoulder: Tenderness present. Decreased range of motion.     Right upper arm: Swelling present. No deformity.     Right forearm: Swelling present.     Cervical back: Full passive range of motion without pain, normal range of motion and neck supple. No spinous process tenderness or muscular tenderness. Normal range of motion.     Right hip: Normal.     Left hip: Normal.     Right lower leg: Edema present.     Left lower leg: Edema present.     Comments: Diffuse bruising right arm  Skin:    General: Skin is warm and dry.     Capillary Refill: Capillary refill takes less than 2 seconds.     Findings: No ecchymosis, erythema, rash or wound.  Neurological:     General: No focal deficit present.     Mental Status: She is alert and oriented to person, place, and time.     GCS: GCS eye subscore is 4. GCS verbal subscore is 5. GCS motor subscore is 6.     Cranial Nerves: Cranial nerves 2-12 are intact.     Sensory: Sensation is intact.     Motor: Motor function is intact.     Coordination: Coordination is intact.  Psychiatric:        Attention and Perception: Attention normal.         Mood and Affect: Mood normal.        Speech: Speech normal.        Behavior: Behavior normal.     ED Results / Procedures / Treatments   Labs (all labs ordered are listed, but only abnormal results are displayed) Labs Reviewed  CBC WITH DIFFERENTIAL/PLATELET - Abnormal; Notable for the following components:      Result Value   WBC 12.4 (*)  RBC 2.07 (*)    Hemoglobin 6.5 (*)    HCT 20.5 (*)    RDW 17.6 (*)    Neutro Abs 11.8 (*)    Lymphs Abs 0.2 (*)    All other components within normal limits  COMPREHENSIVE METABOLIC PANEL - Abnormal; Notable for the following components:   Sodium 132 (*)    Potassium 5.4 (*)    CO2 13 (*)    Glucose, Bld 393 (*)    BUN 93 (*)    Creatinine, Ser 2.02 (*)    Calcium 8.0 (*)    Total Protein 4.9 (*)    Albumin 2.0 (*)    GFR, Estimated 26 (*)    All other components within normal limits  BRAIN NATRIURETIC PEPTIDE - Abnormal; Notable for the following components:   B Natriuretic Peptide 872.0 (*)    All other components within normal limits  URINALYSIS, W/ REFLEX TO CULTURE (INFECTION SUSPECTED) - Abnormal; Notable for the following components:   Color, Urine AMBER (*)    APPearance CLOUDY (*)    Glucose, UA 50 (*)    Hgb urine dipstick SMALL (*)    Protein, ur 100 (*)    Leukocytes,Ua LARGE (*)    Bacteria, UA RARE (*)    All other components within normal limits  URINE CULTURE  PREPARE RBC (CROSSMATCH)    EKG EKG Interpretation  Date/Time:  Monday Mar 25 2023 00:58:14 EDT Ventricular Rate:  89 PR Interval:  160 QRS Duration: 79 QT Interval:  349 QTC Calculation: 425 R Axis:   83 Text Interpretation: Sinus rhythm Atrial premature complexes Borderline right axis deviation Low voltage, precordial leads Nonspecific T abnormalities, lateral leads Baseline wander in lead(s) V2 Confirmed by Gilda Crease [phone]) on 03/25/2023 2:52:24 AM  Radiology DG Chest 2 View  Result Date: 03/25/2023 CLINICAL DATA:  Fall EXAM:  CHEST - 2 VIEW COMPARISON:  02/05/2023 FINDINGS: Heart and mediastinal contours within normal normal limits. No confluent airspace opacities, effusions or pneumothorax. Right humeral neck fracture again noted, better seen on right shoulder series. IMPRESSION: No acute cardiopulmonary disease. Electronically Signed   By: Charlett Nose M.D.   On: 03/25/2023 02:20   DG Forearm Left  Result Date: 03/25/2023 CLINICAL DATA:  Fall EXAM: LEFT FOREARM - 2 VIEW COMPARISON:  None Available. FINDINGS: Diffuse soft tissue swelling within the forearm. No acute bony abnormality. Specifically, no fracture, subluxation, or dislocation. Diffuse vascular calcifications. IMPRESSION: Soft tissue swelling.  No acute bony abnormality. Electronically Signed   By: Charlett Nose M.D.   On: 03/25/2023 02:19   DG Humerus Left  Result Date: 03/25/2023 CLINICAL DATA:  Fall EXAM: LEFT HUMERUS - 2+ VIEW COMPARISON:  None Available. FINDINGS: Degenerative changes in the [facility] joint with joint space narrowing and spurring. Glenohumeral joint is maintained. No acute bony abnormality. Specifically, no fracture, subluxation, or dislocation. Soft tissues are intact. IMPRESSION: Degenerative changes in the left AC joint. No acute bony abnormality. Electronically Signed   By: Charlett Nose M.D.   On: 03/25/2023 02:18   DG Shoulder Right  Result Date: 03/25/2023 CLINICAL DATA:  Fall EXAM: RIGHT SHOULDER - 2+ VIEW COMPARISON:  11/11/2022 FINDINGS: Displaced humeral neck fracture noted as seen on prior study. Increasing displacement of the humeral head relative to the humeral neck. It is difficult to exclude re-injury. Study limited due to patient positioning. IMPRESSION: Displaced humeral neck fracture. Degree of displacement has increased since prior study. Electronically Signed   By: Charlett Nose M.D.  On: 03/25/2023 02:18    Procedures Procedures    Medications Ordered in ED Medications  insulin aspart (novoLOG) injection 0-15 Units (has  no administration in time range)  0.9 %  sodium chloride infusion (Manually program via Guardrails IV Fluids) (has no administration in time range)    ED Course/ Medical Decision Making/ A&P                             Medical Decision Making Amount and/or Complexity of Data Reviewed External Data Reviewed: labs, radiology, ECG and notes. Labs: ordered. Decision-making details documented in ED Course. Radiology: ordered and independent interpretation performed. Decision-making details documented in ED Course. ECG/medicine tests: ordered and independent interpretation performed. Decision-making details documented in ED Course.  Risk Prescription drug management.   Patient presents to the emergency department for evaluation after a fall.  Patient primarily complaining of right shoulder pain.  Patient has a prior proximal humerus fracture.  This does appear somewhat changed today.  She is not wearing a sling.  Patient with diffuse bruising of the left upper extremity.  X-rays negative.  Patient noted to have swelling of all of her extremities.  She does have a history of end-stage renal disease.  BUN and creatinine are at baseline.  Patient noted to be anemic.  Records indicate that she was recently hospitalized for anemia and had a GI workup.  Anemia today is likely multifactorial.  I recommended admission to the patient.  She declines.  She would prefer to be admitted to inpatient hospice.  Explained to her that I could not do that from the emergency department and she likely does not qualify for inpatient hospice at this time, as she is not imminently going to die.  Once again offered admission, she has declined.  I will therefore transfuse her 1 unit of blood and discharged her.  She is already receiving home hospice care.  CRITICAL CARE Performed by: Gilda Crease   Total critical care time: 35 minutes  Critical care time was exclusive of separately billable procedures and  treating other patients.  Critical care was necessary to treat or prevent imminent or life-threatening deterioration.  Critical care was time spent personally by me on the following activities: development of treatment plan with patient and/or surrogate as well as nursing, discussions with consultants, evaluation of patient's response to treatment, examination of patient, obtaining history from patient or surrogate, ordering and performing treatments and interventions, ordering and review of laboratory studies, ordering and review of radiographic studies, pulse oximetry and re-evaluation of patient's condition.         Final Clinical Impression(s) / ED Diagnoses Final diagnoses:  Closed fracture of proximal end of right humerus with delayed healing, unspecified fracture morphology, subsequent encounter  Anemia, unspecified type    Rx / DC Orders ED Discharge Orders     None         Gilda Crease, MD 03/25/23 [phone]

## 2023-03-25 NOTE — ED Notes (Signed)
Pt states pain at the IV site where blood is infusing. This RN check site for infiltration, vs WDL, and notified EDP. IV flushed normal, returned blood normal. Spoke with EDP will slow blood to 125 mL/hr and EDP will get pain medication for pt.

## 2023-03-25 NOTE — ED Provider Notes (Signed)
Patient presents after a fall out of bed.  She has a previously existing right humerus fracture which appears to have increased displacement today.  Hemoglobin was less than 7.  She is currently getting 1 unit PRBCs.  Admission was discussed with off going ED provider.  Patient declines this.  She is on hospice.  She prefers discharge home after blood transfusion. Physical Exam  BP 132/62 (BP Location: Right Arm)   Pulse 89   Temp (!) 97.5 F (36.4 C) (Oral)   Resp 12   Ht 5\' 4"  (1.626 m)   Wt 76.2 kg   LMP  (LMP Unknown)   SpO2 100%   BMI 28.82 kg/m   Physical Exam Vitals and nursing note reviewed.  Constitutional:      General: She is not in acute distress.    Appearance: Normal appearance. She is well-developed. She is not toxic-appearing or diaphoretic.  HENT:     Head: Normocephalic and atraumatic.     Right Ear: External ear normal.     Left Ear: External ear normal.     Nose: Nose normal.     Mouth/Throat:     Mouth: Mucous membranes are moist.  Eyes:     Extraocular Movements: Extraocular movements intact.     Conjunctiva/sclera: Conjunctivae normal.  Cardiovascular:     Rate and Rhythm: Normal rate and regular rhythm.  Pulmonary:     Effort: Pulmonary effort is normal. No respiratory distress.  Abdominal:     General: There is no distension.     Palpations: Abdomen is soft.  Musculoskeletal:        General: Normal range of motion.     Cervical back: Normal range of motion and neck supple.  Skin:    General: Skin is warm and dry.  Neurological:     General: No focal deficit present.     Mental Status: She is alert and oriented to person, place, and time.  Psychiatric:        Mood and Affect: Mood normal.        Behavior: Behavior normal.        Thought Content: Thought content normal.        Judgment: Judgment normal.     Procedures  Procedures  ED Course / MDM    Medical Decision Making Amount and/or Complexity of Data Reviewed Labs:  ordered. Radiology: ordered.  Risk Prescription drug management.   On assessment, blood transfusion is ongoing.  She requests home pain medication.  Per chart review, she is on 5 mg of oxycodone.  Dose of Percocet was ordered.  Patient completed blood transfusion without any side effects.  She was discharged in stable condition.       Gloris Manchester, MD 03/25/23 [phone]

## 2023-03-26 LAB — TYPE AND SCREEN: Unit division: 0

## 2023-03-26 LAB — BPAM RBC
ISSUE DATE / TIME: 202405270504
Unit Type and Rh: 5100

## 2023-03-26 LAB — URINE CULTURE: Culture: >=100000 — AB

## 2023-03-30 DEATH — deceased

## 2023-04-01 ENCOUNTER — Telehealth: Payer: Self-pay

## 2023-04-01 NOTE — Telephone Encounter (Signed)
Transition Care Management Follow-up Telephone Call Date of discharge and from where: 03/25/2023 [facility]. Spoke with patient's spouse. Patient is deceased.  [name]  [facility] Population Health Community Resource Care Guide   [email or web address]  ?? [phone]   Website: [email or web address]  [email or web address]

## 2023-05-23 IMAGING — MR MR HEAD W/O CM
7 series · 48 of 48 positions shown · non-contrast
Comparison: Head CT 03/29/2021.  MRI 05/17/2020.

CLINICAL DATA: Neurological deficit, acute, stroke suspected.
Weakness 1 day.

EXAM:
MRI HEAD WITHOUT CONTRAST
TECHNIQUE: Multiplanar, multiecho pulse sequences of the brain and surrounding
structures were obtained without intravenous contrast.

[Series 5: DWI · axial · 4.0mm · 0.88mm/px · z∈[-78,+59]mm · 8 of 36 slices shown (1 of 6)]
[im 1/36]
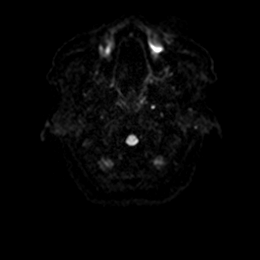
[im 6/36]
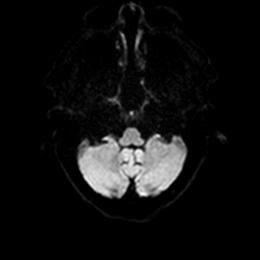
[im 11/36]
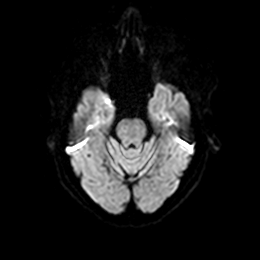
[im 16/36]
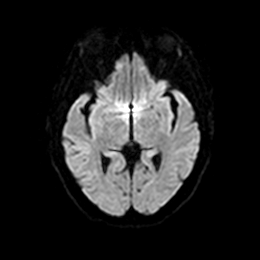
[im 21/36]
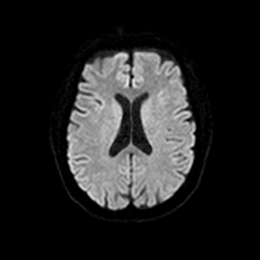
[im 26/36]
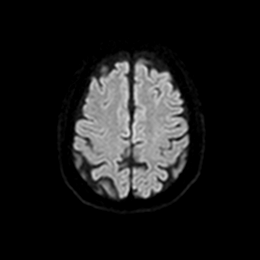
[im 31/36]
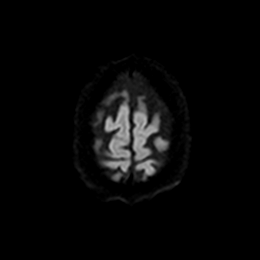
[im 36/36]
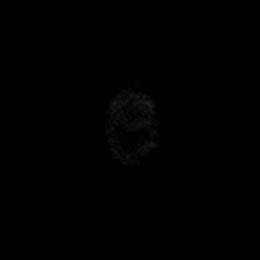

[Series 5: DWI · axial · 4.0mm · 0.88mm/px · z∈[-78,+59]mm · 8 of 36 slices shown (2 of 6)]
[im 1/36]
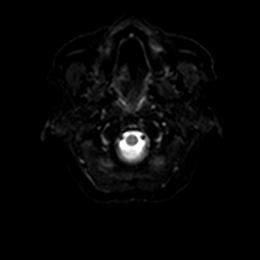
[im 6/36]
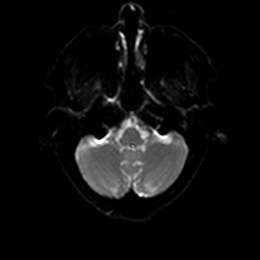
[im 11/36]
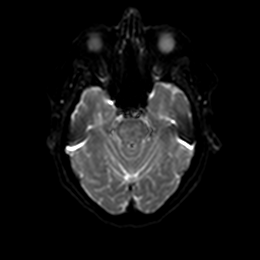
[im 16/36]
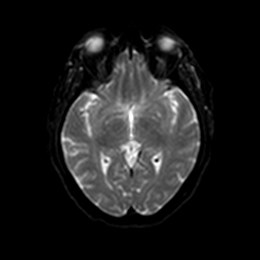
[im 21/36]
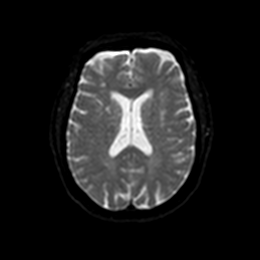
[im 26/36]
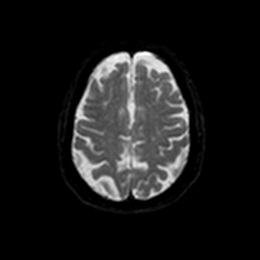
[im 31/36]
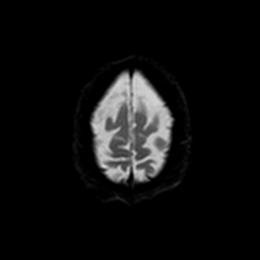
[im 36/36]
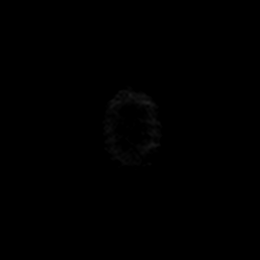

[Series 6: DWI · axial · 4.0mm · 0.88mm/px · z∈[-78,+59]mm · 8 of 36 slices shown (3 of 6)]
[im 1/36]
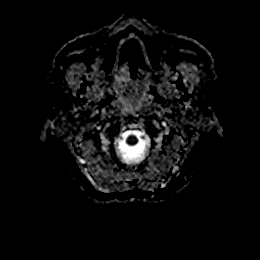
[im 6/36]
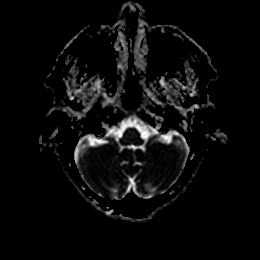
[im 11/36]
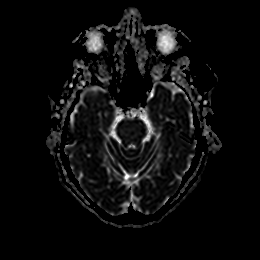
[im 16/36]
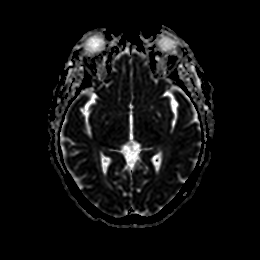
[im 21/36]
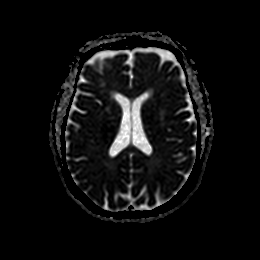
[im 26/36]
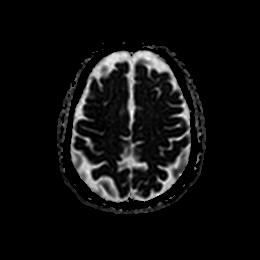
[im 31/36]
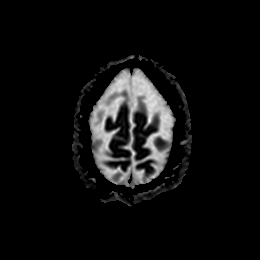
[im 36/36]
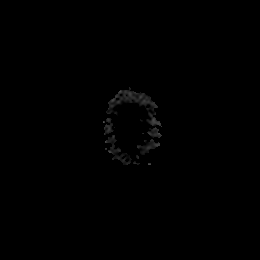

[Series 7: DWI · coronal · 5.0mm · 0.88mm/px · 6 of 28 slices shown (4 of 6)]
[im 1/28]
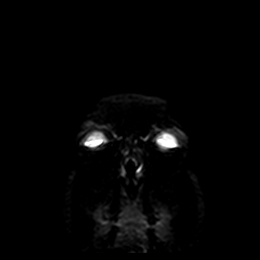
[im 6/28]
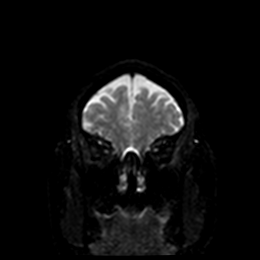
[im 11/28]
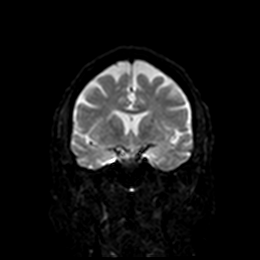
[im 17/28]
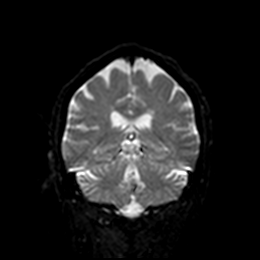
[im 22/28]
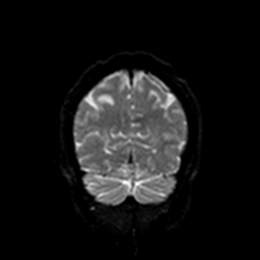
[im 28/28]
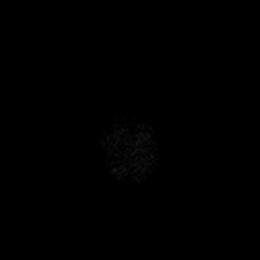

[Series 7: DWI · coronal · 5.0mm · 0.88mm/px · 6 of 28 slices shown (5 of 6)]
[im 1/28]
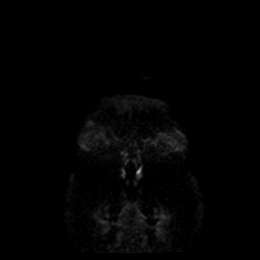
[im 6/28]
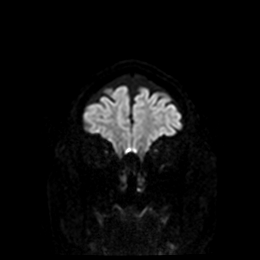
[im 11/28]
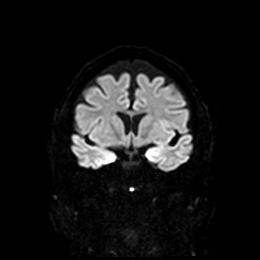
[im 17/28]
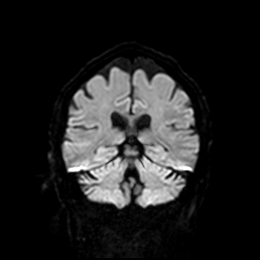
[im 22/28]
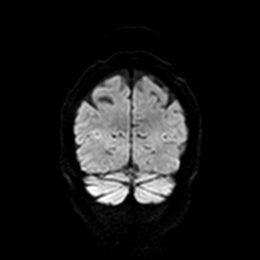
[im 28/28]
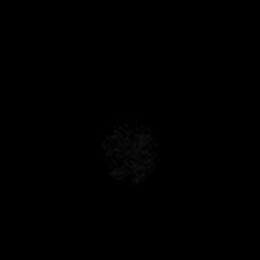

[Series 8: DWI · coronal · 5.0mm · 0.88mm/px · 6 of 28 slices shown (6 of 6)]
[im 1/28]
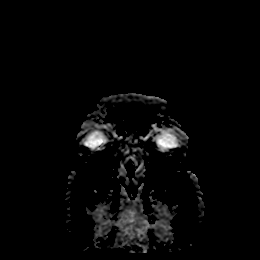
[im 6/28]
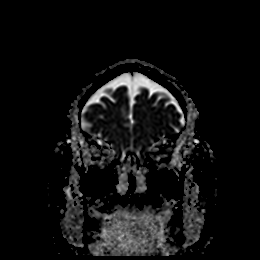
[im 11/28]
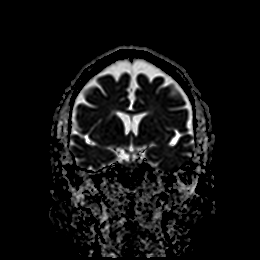
[im 17/28]
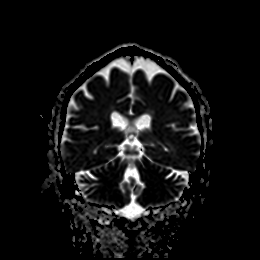
[im 22/28]
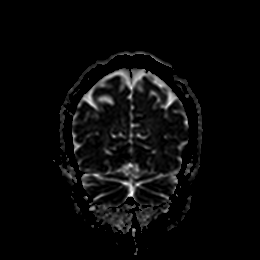
[im 28/28]
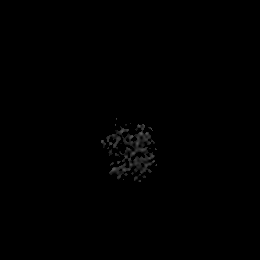

[Series 9: T1 · sagittal · 5.0mm · 0.94mm/px · 6 of 25 slices shown]
[im 1/25]
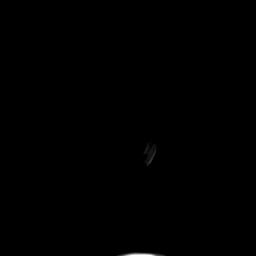
[im 5/25]
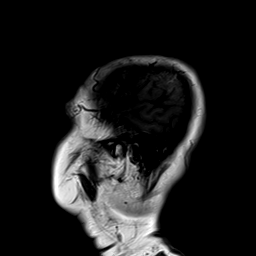
[im 10/25]
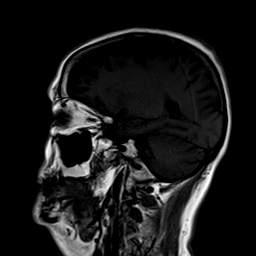
[im 15/25]
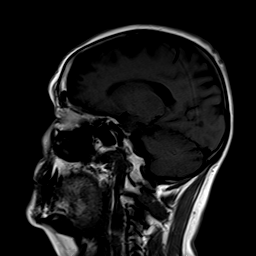
[im 20/25]
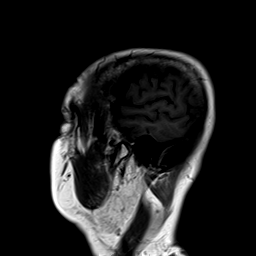
[im 25/25]
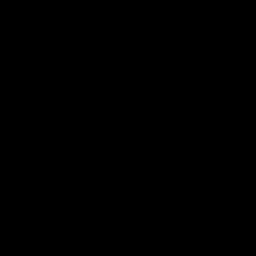

[48 of 48 positions shown; findings below may reference images not displayed]

FINDINGS: Brain: Diffusion imaging does not show any acute or subacute
infarction. Minimal chronic small-vessel ischemic changes are seen
affecting the pons and cerebral hemispheric white matter. No sign of
mass lesion, hemorrhage, hydrocephalus or extra-axial collection.
Abbreviated stroke protocol was utilized, therefore evaluation of
the small meningioma at the right anterior cranial fossa is limited.
This does not appear to have enlarged as seen on the diffusion
imaging. No evidence of hemorrhage, hydrocephalus or extra-axial
collection.

Vascular: Major vessels at the base of the brain show flow.

Skull and upper cervical spine: Negative

Sinuses/Orbits: Clear/normal

Other: None
IMPRESSION: No acute finding. Minimal chronic small-vessel change of the pons
and cerebral hemispheric white matter. No apparent enlargement of a
subcentimeter meningioma at the anterior cranial fossa on right.
